# Patient Record
Sex: Male | Born: 1975
Health system: Southern US, Community
[De-identification: ages and names within clinical notes are randomized; demographics above are authoritative.]

## PROBLEM LIST (undated history)

## (undated) DIAGNOSIS — F32A Depression, unspecified: Secondary | ICD-10-CM

## (undated) DIAGNOSIS — E785 Hyperlipidemia, unspecified: Secondary | ICD-10-CM

## (undated) DIAGNOSIS — F259 Schizoaffective disorder, unspecified: Secondary | ICD-10-CM

## (undated) DIAGNOSIS — I2699 Other pulmonary embolism without acute cor pulmonale: Secondary | ICD-10-CM

## (undated) DIAGNOSIS — E119 Type 2 diabetes mellitus without complications: Secondary | ICD-10-CM

## (undated) DIAGNOSIS — F250 Schizoaffective disorder, bipolar type: Secondary | ICD-10-CM

## (undated) DIAGNOSIS — F329 Major depressive disorder, single episode, unspecified: Secondary | ICD-10-CM

## (undated) DIAGNOSIS — I10 Essential (primary) hypertension: Secondary | ICD-10-CM

## (undated) DIAGNOSIS — R791 Abnormal coagulation profile: Secondary | ICD-10-CM

## (undated) DIAGNOSIS — D6862 Lupus anticoagulant syndrome: Secondary | ICD-10-CM

## (undated) HISTORY — DX: Type 2 diabetes mellitus without complications: E11.9

---

## 2006-02-09 ENCOUNTER — Emergency Department: Payer: Self-pay

## 2006-04-24 ENCOUNTER — Emergency Department: Payer: Self-pay | Admitting: Emergency Medicine

## 2007-12-13 ENCOUNTER — Other Ambulatory Visit: Payer: Self-pay

## 2007-12-13 ENCOUNTER — Emergency Department: Payer: Self-pay | Admitting: Internal Medicine

## 2008-05-09 ENCOUNTER — Emergency Department: Payer: Self-pay | Admitting: Emergency Medicine

## 2008-05-12 ENCOUNTER — Emergency Department: Payer: Self-pay | Admitting: Emergency Medicine

## 2008-05-29 ENCOUNTER — Ambulatory Visit: Payer: Self-pay | Admitting: Internal Medicine

## 2008-06-15 ENCOUNTER — Ambulatory Visit: Payer: Self-pay | Admitting: Internal Medicine

## 2008-08-03 ENCOUNTER — Ambulatory Visit: Payer: Self-pay | Admitting: Internal Medicine

## 2008-08-13 ENCOUNTER — Ambulatory Visit: Payer: Self-pay | Admitting: Internal Medicine

## 2008-11-08 ENCOUNTER — Inpatient Hospital Stay: Payer: Self-pay | Admitting: Psychiatry

## 2008-12-05 ENCOUNTER — Inpatient Hospital Stay: Payer: Self-pay | Admitting: Specialist

## 2008-12-15 ENCOUNTER — Inpatient Hospital Stay: Payer: Self-pay | Admitting: Internal Medicine

## 2009-01-06 ENCOUNTER — Emergency Department: Payer: Self-pay | Admitting: Emergency Medicine

## 2009-01-07 ENCOUNTER — Emergency Department: Payer: Self-pay | Admitting: Emergency Medicine

## 2009-01-08 ENCOUNTER — Inpatient Hospital Stay: Payer: Self-pay | Admitting: Psychiatry

## 2009-01-29 ENCOUNTER — Inpatient Hospital Stay: Payer: Self-pay | Admitting: Psychiatry

## 2009-02-12 ENCOUNTER — Inpatient Hospital Stay: Payer: Self-pay | Admitting: Psychiatry

## 2009-02-25 ENCOUNTER — Inpatient Hospital Stay: Payer: Self-pay | Admitting: Psychiatry

## 2009-04-24 ENCOUNTER — Emergency Department: Payer: Self-pay | Admitting: Emergency Medicine

## 2009-07-01 ENCOUNTER — Inpatient Hospital Stay: Payer: Self-pay | Admitting: Psychiatry

## 2009-11-12 ENCOUNTER — Inpatient Hospital Stay: Payer: Self-pay | Admitting: Psychiatry

## 2010-01-05 ENCOUNTER — Emergency Department: Payer: Self-pay | Admitting: Emergency Medicine

## 2010-10-15 ENCOUNTER — Inpatient Hospital Stay: Payer: Self-pay | Admitting: Psychiatry

## 2010-11-07 ENCOUNTER — Inpatient Hospital Stay: Payer: Self-pay | Admitting: Psychiatry

## 2011-06-18 DIAGNOSIS — Z79899 Other long term (current) drug therapy: Secondary | ICD-10-CM | POA: Diagnosis not present

## 2011-07-17 DIAGNOSIS — Z79899 Other long term (current) drug therapy: Secondary | ICD-10-CM | POA: Diagnosis not present

## 2011-08-03 DIAGNOSIS — B351 Tinea unguium: Secondary | ICD-10-CM | POA: Diagnosis not present

## 2011-08-03 DIAGNOSIS — M79609 Pain in unspecified limb: Secondary | ICD-10-CM | POA: Diagnosis not present

## 2011-08-17 DIAGNOSIS — Z79899 Other long term (current) drug therapy: Secondary | ICD-10-CM | POA: Diagnosis not present

## 2011-09-16 DIAGNOSIS — Z79899 Other long term (current) drug therapy: Secondary | ICD-10-CM | POA: Diagnosis not present

## 2011-10-15 DIAGNOSIS — Z79899 Other long term (current) drug therapy: Secondary | ICD-10-CM | POA: Diagnosis not present

## 2011-10-20 DIAGNOSIS — F3131 Bipolar disorder, current episode depressed, mild: Secondary | ICD-10-CM | POA: Diagnosis not present

## 2011-10-20 DIAGNOSIS — I2782 Chronic pulmonary embolism: Secondary | ICD-10-CM | POA: Diagnosis not present

## 2011-10-20 DIAGNOSIS — E1149 Type 2 diabetes mellitus with other diabetic neurological complication: Secondary | ICD-10-CM | POA: Diagnosis not present

## 2011-10-20 DIAGNOSIS — I10 Essential (primary) hypertension: Secondary | ICD-10-CM | POA: Diagnosis not present

## 2011-10-20 DIAGNOSIS — E785 Hyperlipidemia, unspecified: Secondary | ICD-10-CM | POA: Diagnosis not present

## 2011-10-27 DIAGNOSIS — E785 Hyperlipidemia, unspecified: Secondary | ICD-10-CM | POA: Diagnosis not present

## 2011-10-27 DIAGNOSIS — F3131 Bipolar disorder, current episode depressed, mild: Secondary | ICD-10-CM | POA: Diagnosis not present

## 2011-10-27 DIAGNOSIS — I2782 Chronic pulmonary embolism: Secondary | ICD-10-CM | POA: Diagnosis not present

## 2011-10-27 DIAGNOSIS — I10 Essential (primary) hypertension: Secondary | ICD-10-CM | POA: Diagnosis not present

## 2011-10-27 DIAGNOSIS — E1149 Type 2 diabetes mellitus with other diabetic neurological complication: Secondary | ICD-10-CM | POA: Diagnosis not present

## 2011-11-20 DIAGNOSIS — Z79899 Other long term (current) drug therapy: Secondary | ICD-10-CM | POA: Diagnosis not present

## 2011-11-26 DIAGNOSIS — E1149 Type 2 diabetes mellitus with other diabetic neurological complication: Secondary | ICD-10-CM | POA: Diagnosis not present

## 2011-11-26 DIAGNOSIS — F3131 Bipolar disorder, current episode depressed, mild: Secondary | ICD-10-CM | POA: Diagnosis not present

## 2011-11-26 DIAGNOSIS — I2782 Chronic pulmonary embolism: Secondary | ICD-10-CM | POA: Diagnosis not present

## 2011-11-26 DIAGNOSIS — E785 Hyperlipidemia, unspecified: Secondary | ICD-10-CM | POA: Diagnosis not present

## 2011-11-26 DIAGNOSIS — I10 Essential (primary) hypertension: Secondary | ICD-10-CM | POA: Diagnosis not present

## 2011-12-07 DIAGNOSIS — M79609 Pain in unspecified limb: Secondary | ICD-10-CM | POA: Diagnosis not present

## 2011-12-07 DIAGNOSIS — B351 Tinea unguium: Secondary | ICD-10-CM | POA: Diagnosis not present

## 2011-12-16 DIAGNOSIS — Z79899 Other long term (current) drug therapy: Secondary | ICD-10-CM | POA: Diagnosis not present

## 2011-12-22 DIAGNOSIS — F3131 Bipolar disorder, current episode depressed, mild: Secondary | ICD-10-CM | POA: Diagnosis not present

## 2011-12-22 DIAGNOSIS — E1149 Type 2 diabetes mellitus with other diabetic neurological complication: Secondary | ICD-10-CM | POA: Diagnosis not present

## 2011-12-22 DIAGNOSIS — I10 Essential (primary) hypertension: Secondary | ICD-10-CM | POA: Diagnosis not present

## 2011-12-22 DIAGNOSIS — E785 Hyperlipidemia, unspecified: Secondary | ICD-10-CM | POA: Diagnosis not present

## 2011-12-22 DIAGNOSIS — I2782 Chronic pulmonary embolism: Secondary | ICD-10-CM | POA: Diagnosis not present

## 2012-01-04 DIAGNOSIS — J398 Other specified diseases of upper respiratory tract: Secondary | ICD-10-CM | POA: Diagnosis not present

## 2012-01-04 DIAGNOSIS — J00 Acute nasopharyngitis [common cold]: Secondary | ICD-10-CM | POA: Diagnosis not present

## 2012-01-22 DIAGNOSIS — Z79899 Other long term (current) drug therapy: Secondary | ICD-10-CM | POA: Diagnosis not present

## 2012-02-17 DIAGNOSIS — Z79899 Other long term (current) drug therapy: Secondary | ICD-10-CM | POA: Diagnosis not present

## 2012-03-18 DIAGNOSIS — Z79899 Other long term (current) drug therapy: Secondary | ICD-10-CM | POA: Diagnosis not present

## 2012-04-18 DIAGNOSIS — Z79899 Other long term (current) drug therapy: Secondary | ICD-10-CM | POA: Diagnosis not present

## 2012-05-15 ENCOUNTER — Ambulatory Visit: Payer: Self-pay | Admitting: Internal Medicine

## 2012-05-19 DIAGNOSIS — Z79899 Other long term (current) drug therapy: Secondary | ICD-10-CM | POA: Diagnosis not present

## 2012-05-24 DIAGNOSIS — I2782 Chronic pulmonary embolism: Secondary | ICD-10-CM | POA: Diagnosis not present

## 2012-05-24 DIAGNOSIS — Z23 Encounter for immunization: Secondary | ICD-10-CM | POA: Diagnosis not present

## 2012-05-24 DIAGNOSIS — Z8659 Personal history of other mental and behavioral disorders: Secondary | ICD-10-CM | POA: Diagnosis not present

## 2012-05-24 DIAGNOSIS — E119 Type 2 diabetes mellitus without complications: Secondary | ICD-10-CM | POA: Diagnosis not present

## 2012-06-11 LAB — PROTIME-INR: INR: 1.1

## 2012-06-11 LAB — COMPREHENSIVE METABOLIC PANEL
Albumin: 4.3 g/dL (ref 3.4–5.0)
Anion Gap: 8 (ref 7–16)
BUN: 18 mg/dL (ref 7–18)
Bilirubin,Total: 0.2 mg/dL (ref 0.2–1.0)
EGFR (African American): >60
EGFR (Non-African Amer.): >60
Glucose: 101 mg/dL — ABNORMAL HIGH (ref 65–99)
Osmolality: 278 (ref 275–301)
SGOT(AST): 22 U/L (ref 15–37)
SGPT (ALT): 24 U/L (ref 12–78)
Sodium: 138 mmol/L (ref 136–145)
Total Protein: 7.4 g/dL (ref 6.4–8.2)

## 2012-06-11 LAB — DRUG SCREEN, URINE
Barbiturates, Ur Screen: NEGATIVE (ref ?–200)
Benzodiazepine, Ur Scrn: NEGATIVE (ref ?–200)
Cannabinoid 50 Ng, Ur ~~LOC~~: NEGATIVE (ref ?–50)
Cocaine Metabolite,Ur ~~LOC~~: NEGATIVE (ref ?–300)
MDMA (Ecstasy)Ur Screen: POSITIVE (ref ?–500)
Methadone, Ur Screen: NEGATIVE (ref ?–300)
Opiate, Ur Screen: NEGATIVE (ref ?–300)
Phencyclidine (PCP) Ur S: NEGATIVE (ref ?–25)
Tricyclic, Ur Screen: NEGATIVE (ref ?–1000)

## 2012-06-11 LAB — CBC
HCT: 46.3 % (ref 40.0–52.0)
HGB: 15.9 g/dL (ref 13.0–18.0)
MCH: 30.8 pg (ref 26.0–34.0)
MCHC: 34.3 g/dL (ref 32.0–36.0)
MCV: 90 fL (ref 80–100)

## 2012-06-11 LAB — ACETAMINOPHEN LEVEL: Acetaminophen: <2 ug/mL

## 2012-06-11 LAB — ETHANOL
Ethanol %: <0.003 % (ref 0.000–0.080)
Ethanol: <3 mg/dL

## 2012-06-11 LAB — TSH: Thyroid Stimulating Horm: 1.15 u[IU]/mL

## 2012-06-11 LAB — LITHIUM LEVEL: Lithium: 0.59 mmol/L — ABNORMAL LOW

## 2012-06-11 LAB — SALICYLATE LEVEL: Salicylates, Serum: <1.7 mg/dL

## 2012-06-12 ENCOUNTER — Inpatient Hospital Stay: Payer: Self-pay | Admitting: Psychiatry

## 2012-06-12 DIAGNOSIS — F209 Schizophrenia, unspecified: Secondary | ICD-10-CM | POA: Diagnosis not present

## 2012-06-12 DIAGNOSIS — R112 Nausea with vomiting, unspecified: Secondary | ICD-10-CM | POA: Diagnosis not present

## 2012-06-12 LAB — DIFFERENTIAL
Eosinophil #: 0 10*3/uL (ref 0.0–0.7)
Eosinophil %: 0.1 %
Lymphocyte #: 1.8 10*3/uL (ref 1.0–3.6)
Monocyte #: 0.7 x10 3/mm (ref 0.2–1.0)
Monocyte %: 11 %
Neutrophil #: 4 10*3/uL (ref 1.4–6.5)

## 2012-06-13 DIAGNOSIS — F259 Schizoaffective disorder, unspecified: Secondary | ICD-10-CM | POA: Diagnosis not present

## 2012-06-13 LAB — PROTIME-INR: Prothrombin Time: 13.6 secs (ref 11.5–14.7)

## 2012-06-14 DIAGNOSIS — R894 Abnormal immunological findings in specimens from other organs, systems and tissues: Secondary | ICD-10-CM | POA: Diagnosis not present

## 2012-06-14 DIAGNOSIS — F17200 Nicotine dependence, unspecified, uncomplicated: Secondary | ICD-10-CM | POA: Diagnosis not present

## 2012-06-14 DIAGNOSIS — J449 Chronic obstructive pulmonary disease, unspecified: Secondary | ICD-10-CM | POA: Diagnosis not present

## 2012-06-14 DIAGNOSIS — D6862 Lupus anticoagulant syndrome: Secondary | ICD-10-CM

## 2012-06-14 DIAGNOSIS — Z7901 Long term (current) use of anticoagulants: Secondary | ICD-10-CM | POA: Diagnosis not present

## 2012-06-14 DIAGNOSIS — I10 Essential (primary) hypertension: Secondary | ICD-10-CM | POA: Diagnosis not present

## 2012-06-14 DIAGNOSIS — F209 Schizophrenia, unspecified: Secondary | ICD-10-CM | POA: Diagnosis not present

## 2012-06-14 DIAGNOSIS — Z86711 Personal history of pulmonary embolism: Secondary | ICD-10-CM | POA: Diagnosis not present

## 2012-06-14 DIAGNOSIS — F259 Schizoaffective disorder, unspecified: Secondary | ICD-10-CM | POA: Diagnosis not present

## 2012-06-14 DIAGNOSIS — D6859 Other primary thrombophilia: Secondary | ICD-10-CM | POA: Diagnosis not present

## 2012-06-14 HISTORY — DX: Lupus anticoagulant syndrome: D68.62

## 2012-06-15 DIAGNOSIS — I10 Essential (primary) hypertension: Secondary | ICD-10-CM | POA: Diagnosis not present

## 2012-06-15 DIAGNOSIS — F209 Schizophrenia, unspecified: Secondary | ICD-10-CM | POA: Diagnosis not present

## 2012-06-15 DIAGNOSIS — F17200 Nicotine dependence, unspecified, uncomplicated: Secondary | ICD-10-CM | POA: Diagnosis not present

## 2012-06-15 DIAGNOSIS — J449 Chronic obstructive pulmonary disease, unspecified: Secondary | ICD-10-CM | POA: Diagnosis not present

## 2012-06-15 DIAGNOSIS — D6859 Other primary thrombophilia: Secondary | ICD-10-CM | POA: Diagnosis not present

## 2012-06-15 LAB — PROTIME-INR
INR: 1.2
Prothrombin Time: 15.1 s — ABNORMAL HIGH (ref 11.5–14.7)

## 2012-06-16 DIAGNOSIS — F17200 Nicotine dependence, unspecified, uncomplicated: Secondary | ICD-10-CM | POA: Diagnosis not present

## 2012-06-16 DIAGNOSIS — D6859 Other primary thrombophilia: Secondary | ICD-10-CM | POA: Diagnosis not present

## 2012-06-16 DIAGNOSIS — F259 Schizoaffective disorder, unspecified: Secondary | ICD-10-CM | POA: Diagnosis not present

## 2012-06-16 DIAGNOSIS — J449 Chronic obstructive pulmonary disease, unspecified: Secondary | ICD-10-CM | POA: Diagnosis not present

## 2012-06-16 DIAGNOSIS — I10 Essential (primary) hypertension: Secondary | ICD-10-CM | POA: Diagnosis not present

## 2012-06-16 DIAGNOSIS — F209 Schizophrenia, unspecified: Secondary | ICD-10-CM | POA: Diagnosis not present

## 2012-06-16 LAB — PLATELET COUNT: Platelet: 205 10*3/uL (ref 150–440)

## 2012-06-16 LAB — PROTIME-INR: Prothrombin Time: 14.5 secs (ref 11.5–14.7)

## 2012-06-16 LAB — CREATININE, SERUM: Creatinine: 0.72 mg/dL (ref 0.60–1.30)

## 2012-06-17 DIAGNOSIS — F259 Schizoaffective disorder, unspecified: Secondary | ICD-10-CM | POA: Diagnosis not present

## 2012-06-17 DIAGNOSIS — F209 Schizophrenia, unspecified: Secondary | ICD-10-CM | POA: Diagnosis not present

## 2012-06-17 DIAGNOSIS — I10 Essential (primary) hypertension: Secondary | ICD-10-CM | POA: Diagnosis not present

## 2012-06-17 DIAGNOSIS — J449 Chronic obstructive pulmonary disease, unspecified: Secondary | ICD-10-CM | POA: Diagnosis not present

## 2012-06-17 DIAGNOSIS — F17200 Nicotine dependence, unspecified, uncomplicated: Secondary | ICD-10-CM | POA: Diagnosis not present

## 2012-06-17 DIAGNOSIS — D6859 Other primary thrombophilia: Secondary | ICD-10-CM | POA: Diagnosis not present

## 2012-06-17 LAB — PROTIME-INR: INR: 1.2

## 2012-06-18 DIAGNOSIS — D6859 Other primary thrombophilia: Secondary | ICD-10-CM | POA: Diagnosis not present

## 2012-06-18 DIAGNOSIS — F209 Schizophrenia, unspecified: Secondary | ICD-10-CM | POA: Diagnosis not present

## 2012-06-18 DIAGNOSIS — F17200 Nicotine dependence, unspecified, uncomplicated: Secondary | ICD-10-CM | POA: Diagnosis not present

## 2012-06-18 DIAGNOSIS — J449 Chronic obstructive pulmonary disease, unspecified: Secondary | ICD-10-CM | POA: Diagnosis not present

## 2012-06-18 DIAGNOSIS — F259 Schizoaffective disorder, unspecified: Secondary | ICD-10-CM | POA: Diagnosis not present

## 2012-06-18 DIAGNOSIS — I10 Essential (primary) hypertension: Secondary | ICD-10-CM | POA: Diagnosis not present

## 2012-06-19 DIAGNOSIS — D6859 Other primary thrombophilia: Secondary | ICD-10-CM | POA: Diagnosis not present

## 2012-06-19 DIAGNOSIS — F209 Schizophrenia, unspecified: Secondary | ICD-10-CM | POA: Diagnosis not present

## 2012-06-19 DIAGNOSIS — I10 Essential (primary) hypertension: Secondary | ICD-10-CM | POA: Diagnosis not present

## 2012-06-19 DIAGNOSIS — F259 Schizoaffective disorder, unspecified: Secondary | ICD-10-CM | POA: Diagnosis not present

## 2012-06-19 DIAGNOSIS — J449 Chronic obstructive pulmonary disease, unspecified: Secondary | ICD-10-CM | POA: Diagnosis not present

## 2012-06-19 DIAGNOSIS — F17200 Nicotine dependence, unspecified, uncomplicated: Secondary | ICD-10-CM | POA: Diagnosis not present

## 2012-06-19 LAB — DIFFERENTIAL
Eosinophil %: 0.1 %
Lymphocyte %: 28 %
Monocyte #: 0.6 x10 3/mm (ref 0.2–1.0)
Monocyte %: 12.3 %
Neutrophil #: 3.1 10*3/uL (ref 1.4–6.5)
Neutrophil %: 59.3 %

## 2012-06-19 LAB — WBC: WBC: 5.1 10*3/uL (ref 3.8–10.6)

## 2012-06-19 LAB — PROTIME-INR: Prothrombin Time: 26.9 secs — ABNORMAL HIGH (ref 11.5–14.7)

## 2012-06-20 DIAGNOSIS — F17200 Nicotine dependence, unspecified, uncomplicated: Secondary | ICD-10-CM | POA: Diagnosis not present

## 2012-06-20 DIAGNOSIS — F209 Schizophrenia, unspecified: Secondary | ICD-10-CM | POA: Diagnosis not present

## 2012-06-20 DIAGNOSIS — F259 Schizoaffective disorder, unspecified: Secondary | ICD-10-CM | POA: Diagnosis not present

## 2012-06-20 DIAGNOSIS — D6859 Other primary thrombophilia: Secondary | ICD-10-CM | POA: Diagnosis not present

## 2012-06-20 DIAGNOSIS — J449 Chronic obstructive pulmonary disease, unspecified: Secondary | ICD-10-CM | POA: Diagnosis not present

## 2012-06-20 DIAGNOSIS — I10 Essential (primary) hypertension: Secondary | ICD-10-CM | POA: Diagnosis not present

## 2012-06-29 DIAGNOSIS — Z79899 Other long term (current) drug therapy: Secondary | ICD-10-CM | POA: Diagnosis not present

## 2012-07-12 DIAGNOSIS — M79609 Pain in unspecified limb: Secondary | ICD-10-CM | POA: Diagnosis not present

## 2012-07-12 DIAGNOSIS — B351 Tinea unguium: Secondary | ICD-10-CM | POA: Diagnosis not present

## 2012-07-20 DIAGNOSIS — H31009 Unspecified chorioretinal scars, unspecified eye: Secondary | ICD-10-CM | POA: Diagnosis not present

## 2012-08-02 DIAGNOSIS — E1149 Type 2 diabetes mellitus with other diabetic neurological complication: Secondary | ICD-10-CM | POA: Diagnosis not present

## 2012-08-02 DIAGNOSIS — I2782 Chronic pulmonary embolism: Secondary | ICD-10-CM | POA: Diagnosis not present

## 2012-08-02 DIAGNOSIS — F3131 Bipolar disorder, current episode depressed, mild: Secondary | ICD-10-CM | POA: Diagnosis not present

## 2012-09-11 LAB — URINALYSIS, COMPLETE
Bilirubin,UR: NEGATIVE
Glucose,UR: NEGATIVE mg/dL (ref 0–75)
Hyaline Cast: 3
Nitrite: NEGATIVE
Protein: 100
RBC,UR: 1 /HPF (ref 0–5)
Specific Gravity: 1.025 (ref 1.003–1.030)
Squamous Epithelial: 4
WBC UR: 8 /HPF (ref 0–5)

## 2012-09-11 LAB — COMPREHENSIVE METABOLIC PANEL
Albumin: 4.2 g/dL (ref 3.4–5.0)
Alkaline Phosphatase: 76 U/L (ref 50–136)
BUN: 18 mg/dL (ref 7–18)
Calcium, Total: 9 mg/dL (ref 8.5–10.1)
Chloride: 105 mmol/L (ref 98–107)
Co2: 21 mmol/L (ref 21–32)
Creatinine: 1.14 mg/dL (ref 0.60–1.30)
EGFR (African American): >60
EGFR (Non-African Amer.): >60
Glucose: 142 mg/dL — ABNORMAL HIGH (ref 65–99)
Osmolality: 280 (ref 275–301)
Potassium: 3.8 mmol/L (ref 3.5–5.1)
SGOT(AST): 15 U/L (ref 15–37)

## 2012-09-11 LAB — CBC
HCT: 44.1 % (ref 40.0–52.0)
MCH: 31.4 pg (ref 26.0–34.0)
MCHC: 35.1 g/dL (ref 32.0–36.0)
MCV: 90 fL (ref 80–100)
Platelet: 248 10*3/uL (ref 150–440)
RBC: 4.92 10*6/uL (ref 4.40–5.90)
RDW: 12.9 % (ref 11.5–14.5)
WBC: 9.4 10*3/uL (ref 3.8–10.6)

## 2012-09-11 LAB — DIFFERENTIAL
Basophil #: 0 10*3/uL (ref 0.0–0.1)
Basophil %: 0.4 %
Eosinophil %: 0.1 %
Lymphocyte #: 1.2 10*3/uL (ref 1.0–3.6)
Lymphocyte %: 12.7 %
Monocyte #: 0.8 x10 3/mm (ref 0.2–1.0)
Monocyte %: 8.5 %
Neutrophil #: 7.4 10*3/uL — ABNORMAL HIGH (ref 1.4–6.5)
Neutrophil %: 78.3 %

## 2012-09-11 LAB — DRUG SCREEN, URINE
Barbiturates, Ur Screen: NEGATIVE (ref ?–200)
Benzodiazepine, Ur Scrn: NEGATIVE (ref ?–200)
Cocaine Metabolite,Ur ~~LOC~~: NEGATIVE (ref ?–300)
Methadone, Ur Screen: NEGATIVE (ref ?–300)
Opiate, Ur Screen: NEGATIVE (ref ?–300)
Tricyclic, Ur Screen: NEGATIVE (ref ?–1000)

## 2012-09-11 LAB — ETHANOL: Ethanol %: <0.003 % (ref 0.000–0.080)

## 2012-09-11 LAB — LITHIUM LEVEL: Lithium: 1.12 mmol/L

## 2012-09-12 ENCOUNTER — Inpatient Hospital Stay: Payer: Self-pay | Admitting: Psychiatry

## 2012-09-12 DIAGNOSIS — F200 Paranoid schizophrenia: Secondary | ICD-10-CM | POA: Diagnosis not present

## 2012-09-12 DIAGNOSIS — I10 Essential (primary) hypertension: Secondary | ICD-10-CM | POA: Diagnosis present

## 2012-09-12 DIAGNOSIS — E119 Type 2 diabetes mellitus without complications: Secondary | ICD-10-CM | POA: Diagnosis not present

## 2012-09-12 DIAGNOSIS — Z86711 Personal history of pulmonary embolism: Secondary | ICD-10-CM | POA: Diagnosis not present

## 2012-09-12 DIAGNOSIS — R791 Abnormal coagulation profile: Secondary | ICD-10-CM | POA: Diagnosis present

## 2012-09-12 DIAGNOSIS — E785 Hyperlipidemia, unspecified: Secondary | ICD-10-CM | POA: Diagnosis not present

## 2012-09-12 DIAGNOSIS — Z818 Family history of other mental and behavioral disorders: Secondary | ICD-10-CM | POA: Diagnosis not present

## 2012-09-12 DIAGNOSIS — F259 Schizoaffective disorder, unspecified: Secondary | ICD-10-CM | POA: Diagnosis not present

## 2012-09-12 DIAGNOSIS — F411 Generalized anxiety disorder: Secondary | ICD-10-CM | POA: Diagnosis not present

## 2012-09-12 DIAGNOSIS — Z8659 Personal history of other mental and behavioral disorders: Secondary | ICD-10-CM | POA: Diagnosis not present

## 2012-09-12 DIAGNOSIS — Z880 Allergy status to penicillin: Secondary | ICD-10-CM | POA: Diagnosis not present

## 2012-09-12 DIAGNOSIS — Z79899 Other long term (current) drug therapy: Secondary | ICD-10-CM | POA: Diagnosis not present

## 2012-09-12 DIAGNOSIS — J449 Chronic obstructive pulmonary disease, unspecified: Secondary | ICD-10-CM | POA: Diagnosis not present

## 2012-09-12 DIAGNOSIS — F2089 Other schizophrenia: Secondary | ICD-10-CM | POA: Diagnosis not present

## 2012-09-12 DIAGNOSIS — F29 Unspecified psychosis not due to a substance or known physiological condition: Secondary | ICD-10-CM | POA: Diagnosis not present

## 2012-09-12 DIAGNOSIS — IMO0002 Reserved for concepts with insufficient information to code with codable children: Secondary | ICD-10-CM | POA: Diagnosis not present

## 2012-09-12 DIAGNOSIS — R45851 Suicidal ideations: Secondary | ICD-10-CM | POA: Diagnosis not present

## 2012-09-12 DIAGNOSIS — Z0389 Encounter for observation for other suspected diseases and conditions ruled out: Secondary | ICD-10-CM | POA: Diagnosis not present

## 2012-09-12 LAB — PROTIME-INR
INR: 4.1
Prothrombin Time: 37.8 secs — ABNORMAL HIGH (ref 11.5–14.7)

## 2012-09-14 LAB — PROTIME-INR
INR: 1.4
Prothrombin Time: 17.5 secs — ABNORMAL HIGH (ref 11.5–14.7)

## 2012-09-15 LAB — PROTIME-INR
INR: 1.2
Prothrombin Time: 15.1 secs — ABNORMAL HIGH (ref 11.5–14.7)

## 2012-09-15 LAB — HEMOGLOBIN: HGB: 13.3 g/dL (ref 13.0–18.0)

## 2012-09-17 ENCOUNTER — Inpatient Hospital Stay: Payer: Self-pay | Admitting: Psychiatry

## 2012-09-17 DIAGNOSIS — F2089 Other schizophrenia: Secondary | ICD-10-CM | POA: Diagnosis not present

## 2012-09-17 LAB — COMPREHENSIVE METABOLIC PANEL
Albumin: 3.9 g/dL (ref 3.4–5.0)
Alkaline Phosphatase: 75 U/L (ref 50–136)
BUN: 16 mg/dL (ref 7–18)
Bilirubin,Total: 0.2 mg/dL (ref 0.2–1.0)
Calcium, Total: 9 mg/dL (ref 8.5–10.1)
Creatinine: 0.81 mg/dL (ref 0.60–1.30)
Glucose: 114 mg/dL — ABNORMAL HIGH (ref 65–99)
Osmolality: 281 (ref 275–301)
Potassium: 4.5 mmol/L (ref 3.5–5.1)
SGOT(AST): 30 U/L (ref 15–37)
SGPT (ALT): 39 U/L (ref 12–78)
Total Protein: 6.8 g/dL (ref 6.4–8.2)

## 2012-09-17 LAB — ACETAMINOPHEN LEVEL: Acetaminophen: <2 ug/mL

## 2012-09-17 LAB — DRUG SCREEN, URINE
Barbiturates, Ur Screen: NEGATIVE (ref ?–200)
Benzodiazepine, Ur Scrn: POSITIVE (ref ?–200)
Cannabinoid 50 Ng, Ur ~~LOC~~: NEGATIVE (ref ?–50)
Cocaine Metabolite,Ur ~~LOC~~: NEGATIVE (ref ?–300)
MDMA (Ecstasy)Ur Screen: NEGATIVE (ref ?–500)
Opiate, Ur Screen: NEGATIVE (ref ?–300)
Tricyclic, Ur Screen: NEGATIVE (ref ?–1000)

## 2012-09-17 LAB — CBC
HCT: 42.5 % (ref 40.0–52.0)
HGB: 14.6 g/dL (ref 13.0–18.0)
MCHC: 34.3 g/dL (ref 32.0–36.0)

## 2012-09-17 LAB — SALICYLATE LEVEL: Salicylates, Serum: <1.7 mg/dL

## 2012-09-17 LAB — ETHANOL
Ethanol %: <0.003 % (ref 0.000–0.080)
Ethanol: <3 mg/dL

## 2012-09-17 LAB — TSH: Thyroid Stimulating Horm: 1.57 u[IU]/mL

## 2012-09-18 LAB — DIFFERENTIAL
Basophil #: 0 10*3/uL (ref 0.0–0.1)
Basophil %: 0.3 %
Lymphocyte #: 1.2 10*3/uL (ref 1.0–3.6)
Lymphocyte %: 22.1 %
Monocyte #: 0.7 x10 3/mm (ref 0.2–1.0)
Monocyte %: 12.2 %
Neutrophil #: 3.6 10*3/uL (ref 1.4–6.5)
Neutrophil %: 65.4 %

## 2012-09-18 LAB — WBC: WBC: 5.4 10*3/uL (ref 3.8–10.6)

## 2012-09-20 LAB — PROTIME-INR: Prothrombin Time: 20.9 secs — ABNORMAL HIGH (ref 11.5–14.7)

## 2012-09-24 LAB — PROTIME-INR: Prothrombin Time: 31 secs — ABNORMAL HIGH (ref 11.5–14.7)

## 2012-09-25 LAB — DIFFERENTIAL
Basophil #: 0 10*3/uL (ref 0.0–0.1)
Basophil %: 0.2 %
Eosinophil %: 0 %
Lymphocyte #: 1.5 10*3/uL (ref 1.0–3.6)
Lymphocyte %: 25.8 %
Neutrophil #: 3.6 10*3/uL (ref 1.4–6.5)
Neutrophil %: 62.1 %

## 2012-09-25 LAB — PROTIME-INR
INR: 2.5
Prothrombin Time: 26.1 secs — ABNORMAL HIGH (ref 11.5–14.7)

## 2012-09-27 LAB — PROTIME-INR: INR: 1.2

## 2012-11-17 DIAGNOSIS — B351 Tinea unguium: Secondary | ICD-10-CM | POA: Diagnosis not present

## 2012-11-17 DIAGNOSIS — E119 Type 2 diabetes mellitus without complications: Secondary | ICD-10-CM | POA: Diagnosis not present

## 2012-11-29 DIAGNOSIS — Z79899 Other long term (current) drug therapy: Secondary | ICD-10-CM | POA: Diagnosis not present

## 2012-12-07 DIAGNOSIS — E119 Type 2 diabetes mellitus without complications: Secondary | ICD-10-CM | POA: Diagnosis not present

## 2012-12-07 DIAGNOSIS — I2782 Chronic pulmonary embolism: Secondary | ICD-10-CM | POA: Diagnosis not present

## 2012-12-07 DIAGNOSIS — I10 Essential (primary) hypertension: Secondary | ICD-10-CM | POA: Diagnosis not present

## 2012-12-07 DIAGNOSIS — E785 Hyperlipidemia, unspecified: Secondary | ICD-10-CM | POA: Diagnosis not present

## 2012-12-07 DIAGNOSIS — E669 Obesity, unspecified: Secondary | ICD-10-CM | POA: Diagnosis not present

## 2012-12-07 DIAGNOSIS — E1142 Type 2 diabetes mellitus with diabetic polyneuropathy: Secondary | ICD-10-CM | POA: Diagnosis not present

## 2012-12-07 DIAGNOSIS — I209 Angina pectoris, unspecified: Secondary | ICD-10-CM | POA: Diagnosis not present

## 2012-12-07 DIAGNOSIS — E1149 Type 2 diabetes mellitus with other diabetic neurological complication: Secondary | ICD-10-CM | POA: Diagnosis not present

## 2012-12-09 DIAGNOSIS — E785 Hyperlipidemia, unspecified: Secondary | ICD-10-CM | POA: Diagnosis not present

## 2012-12-09 DIAGNOSIS — I2782 Chronic pulmonary embolism: Secondary | ICD-10-CM | POA: Diagnosis not present

## 2012-12-09 DIAGNOSIS — E119 Type 2 diabetes mellitus without complications: Secondary | ICD-10-CM | POA: Diagnosis not present

## 2012-12-09 DIAGNOSIS — Z8659 Personal history of other mental and behavioral disorders: Secondary | ICD-10-CM | POA: Diagnosis not present

## 2012-12-09 DIAGNOSIS — F3131 Bipolar disorder, current episode depressed, mild: Secondary | ICD-10-CM | POA: Diagnosis not present

## 2012-12-27 DIAGNOSIS — I2782 Chronic pulmonary embolism: Secondary | ICD-10-CM | POA: Diagnosis not present

## 2012-12-27 DIAGNOSIS — F3131 Bipolar disorder, current episode depressed, mild: Secondary | ICD-10-CM | POA: Diagnosis not present

## 2012-12-27 DIAGNOSIS — E119 Type 2 diabetes mellitus without complications: Secondary | ICD-10-CM | POA: Diagnosis not present

## 2013-02-01 DIAGNOSIS — E669 Obesity, unspecified: Secondary | ICD-10-CM | POA: Diagnosis not present

## 2013-02-01 DIAGNOSIS — E119 Type 2 diabetes mellitus without complications: Secondary | ICD-10-CM | POA: Diagnosis not present

## 2013-02-01 DIAGNOSIS — I209 Angina pectoris, unspecified: Secondary | ICD-10-CM | POA: Diagnosis not present

## 2013-02-01 DIAGNOSIS — E785 Hyperlipidemia, unspecified: Secondary | ICD-10-CM | POA: Diagnosis not present

## 2013-02-01 DIAGNOSIS — I2782 Chronic pulmonary embolism: Secondary | ICD-10-CM | POA: Diagnosis not present

## 2013-02-01 DIAGNOSIS — T887XXA Unspecified adverse effect of drug or medicament, initial encounter: Secondary | ICD-10-CM | POA: Diagnosis not present

## 2013-02-01 DIAGNOSIS — F3131 Bipolar disorder, current episode depressed, mild: Secondary | ICD-10-CM | POA: Diagnosis not present

## 2013-02-10 DIAGNOSIS — E1169 Type 2 diabetes mellitus with other specified complication: Secondary | ICD-10-CM | POA: Diagnosis not present

## 2013-02-10 DIAGNOSIS — F3131 Bipolar disorder, current episode depressed, mild: Secondary | ICD-10-CM | POA: Diagnosis not present

## 2013-02-10 DIAGNOSIS — E785 Hyperlipidemia, unspecified: Secondary | ICD-10-CM | POA: Diagnosis not present

## 2013-02-10 DIAGNOSIS — I209 Angina pectoris, unspecified: Secondary | ICD-10-CM | POA: Diagnosis not present

## 2013-02-27 LAB — CBC
HCT: 52.4 % — ABNORMAL HIGH (ref 40.0–52.0)
HGB: 18.3 g/dL — ABNORMAL HIGH (ref 13.0–18.0)
Platelet: 363 10*3/uL (ref 150–440)
RBC: 5.95 10*6/uL — ABNORMAL HIGH (ref 4.40–5.90)
WBC: 11.3 10*3/uL — ABNORMAL HIGH (ref 3.8–10.6)

## 2013-02-27 LAB — ETHANOL
Ethanol %: <0.003 % (ref 0.000–0.080)
Ethanol: <3 mg/dL

## 2013-02-27 LAB — URINALYSIS, COMPLETE
Bilirubin,UR: NEGATIVE
Glucose,UR: NEGATIVE mg/dL (ref 0–75)
Leukocyte Esterase: NEGATIVE
Nitrite: NEGATIVE
Ph: 6 (ref 4.5–8.0)
Protein: 25
Specific Gravity: 1.025 (ref 1.003–1.030)
WBC UR: 6 /HPF (ref 0–5)

## 2013-02-27 LAB — LITHIUM LEVEL: Lithium: 0.69 mmol/L

## 2013-02-27 LAB — DRUG SCREEN, URINE
Barbiturates, Ur Screen: NEGATIVE (ref ?–200)
Benzodiazepine, Ur Scrn: NEGATIVE (ref ?–200)
Cocaine Metabolite,Ur ~~LOC~~: NEGATIVE (ref ?–300)
MDMA (Ecstasy)Ur Screen: NEGATIVE (ref ?–500)
Opiate, Ur Screen: NEGATIVE (ref ?–300)
Phencyclidine (PCP) Ur S: NEGATIVE (ref ?–25)

## 2013-02-27 LAB — COMPREHENSIVE METABOLIC PANEL
Albumin: 4.6 g/dL (ref 3.4–5.0)
Alkaline Phosphatase: 79 U/L (ref 50–136)
Anion Gap: 9 (ref 7–16)
Chloride: 98 mmol/L (ref 98–107)
EGFR (African American): >60
EGFR (Non-African Amer.): >60
Osmolality: 276 (ref 275–301)
SGOT(AST): 23 U/L (ref 15–37)
SGPT (ALT): 23 U/L (ref 12–78)
Sodium: 135 mmol/L — ABNORMAL LOW (ref 136–145)

## 2013-02-27 LAB — DIFFERENTIAL
Comment - H1-Com1: NORMAL
Monocytes: 9 %

## 2013-02-27 LAB — PROTIME-INR
INR: 1.9
Prothrombin Time: 21.7 secs — ABNORMAL HIGH (ref 11.5–14.7)

## 2013-02-28 ENCOUNTER — Inpatient Hospital Stay: Payer: Self-pay | Admitting: Psychiatry

## 2013-02-28 DIAGNOSIS — Z6221 Child in welfare custody: Secondary | ICD-10-CM | POA: Diagnosis not present

## 2013-02-28 DIAGNOSIS — I10 Essential (primary) hypertension: Secondary | ICD-10-CM | POA: Diagnosis not present

## 2013-02-28 DIAGNOSIS — IMO0002 Reserved for concepts with insufficient information to code with codable children: Secondary | ICD-10-CM | POA: Diagnosis not present

## 2013-02-28 DIAGNOSIS — Z8659 Personal history of other mental and behavioral disorders: Secondary | ICD-10-CM | POA: Diagnosis not present

## 2013-02-28 DIAGNOSIS — F259 Schizoaffective disorder, unspecified: Secondary | ICD-10-CM | POA: Diagnosis not present

## 2013-02-28 DIAGNOSIS — F429 Obsessive-compulsive disorder, unspecified: Secondary | ICD-10-CM | POA: Diagnosis present

## 2013-02-28 DIAGNOSIS — E119 Type 2 diabetes mellitus without complications: Secondary | ICD-10-CM | POA: Diagnosis not present

## 2013-02-28 DIAGNOSIS — Z7901 Long term (current) use of anticoagulants: Secondary | ICD-10-CM | POA: Diagnosis not present

## 2013-02-28 DIAGNOSIS — Z79899 Other long term (current) drug therapy: Secondary | ICD-10-CM | POA: Diagnosis not present

## 2013-02-28 DIAGNOSIS — Z880 Allergy status to penicillin: Secondary | ICD-10-CM | POA: Diagnosis not present

## 2013-02-28 DIAGNOSIS — Z818 Family history of other mental and behavioral disorders: Secondary | ICD-10-CM | POA: Diagnosis not present

## 2013-02-28 DIAGNOSIS — F200 Paranoid schizophrenia: Secondary | ICD-10-CM | POA: Diagnosis not present

## 2013-02-28 DIAGNOSIS — Z91199 Patient's noncompliance with other medical treatment and regimen due to unspecified reason: Secondary | ICD-10-CM | POA: Diagnosis not present

## 2013-02-28 DIAGNOSIS — J449 Chronic obstructive pulmonary disease, unspecified: Secondary | ICD-10-CM | POA: Diagnosis not present

## 2013-02-28 DIAGNOSIS — F209 Schizophrenia, unspecified: Secondary | ICD-10-CM | POA: Diagnosis not present

## 2013-02-28 DIAGNOSIS — F411 Generalized anxiety disorder: Secondary | ICD-10-CM | POA: Diagnosis present

## 2013-02-28 DIAGNOSIS — Z9119 Patient's noncompliance with other medical treatment and regimen: Secondary | ICD-10-CM | POA: Diagnosis not present

## 2013-02-28 DIAGNOSIS — IMO0001 Reserved for inherently not codable concepts without codable children: Secondary | ICD-10-CM | POA: Diagnosis not present

## 2013-02-28 DIAGNOSIS — Z7982 Long term (current) use of aspirin: Secondary | ICD-10-CM | POA: Diagnosis not present

## 2013-02-28 DIAGNOSIS — Z86711 Personal history of pulmonary embolism: Secondary | ICD-10-CM | POA: Diagnosis not present

## 2013-02-28 DIAGNOSIS — E785 Hyperlipidemia, unspecified: Secondary | ICD-10-CM | POA: Diagnosis not present

## 2013-02-28 LAB — PROTIME-INR: INR: 2.7

## 2013-03-02 LAB — PROTIME-INR
INR: 1.6
Prothrombin Time: 18.6 secs — ABNORMAL HIGH (ref 11.5–14.7)

## 2013-03-03 LAB — HEMOGLOBIN: HGB: 15.7 g/dL (ref 13.0–18.0)

## 2013-03-06 LAB — DIFFERENTIAL
Basophil #: 0 10*3/uL (ref 0.0–0.1)
Basophil %: 0.4 %
Eosinophil #: 0 10*3/uL (ref 0.0–0.7)
Eosinophil %: 0 %
Lymphocyte #: 1.4 10*3/uL (ref 1.0–3.6)
Lymphocyte %: 22.6 %
Monocyte %: 14.3 %
Neutrophil %: 62.7 %

## 2013-03-06 LAB — PROTIME-INR: INR: 1.7

## 2013-03-07 LAB — PROTIME-INR
INR: 2.1
Prothrombin Time: 22.9 secs — ABNORMAL HIGH (ref 11.5–14.7)

## 2013-03-07 LAB — HEMOGLOBIN: HGB: 13.9 g/dL (ref 13.0–18.0)

## 2013-03-09 LAB — PROTIME-INR: INR: 2.9

## 2013-03-11 LAB — HEMOGLOBIN: HGB: 14 g/dL (ref 13.0–18.0)

## 2013-03-11 LAB — PROTIME-INR: Prothrombin Time: 38.5 secs — ABNORMAL HIGH (ref 11.5–14.7)

## 2013-03-12 LAB — PROTIME-INR
INR: 2.7
Prothrombin Time: 28.2 secs — ABNORMAL HIGH (ref 11.5–14.7)

## 2013-03-13 LAB — DIFFERENTIAL
Basophil #: 0 10*3/uL (ref 0.0–0.1)
Eosinophil #: 0 10*3/uL (ref 0.0–0.7)
Lymphocyte #: 1.6 10*3/uL (ref 1.0–3.6)
Lymphocyte %: 25.4 %
Neutrophil #: 3.8 10*3/uL (ref 1.4–6.5)

## 2013-03-13 LAB — PROTIME-INR
INR: 2.5
Prothrombin Time: 26.6 secs — ABNORMAL HIGH (ref 11.5–14.7)

## 2013-03-21 DIAGNOSIS — F259 Schizoaffective disorder, unspecified: Secondary | ICD-10-CM | POA: Diagnosis not present

## 2013-03-26 LAB — URINALYSIS, COMPLETE
Bilirubin,UR: NEGATIVE
Glucose,UR: NEGATIVE mg/dL (ref 0–75)
Ketone: NEGATIVE
Leukocyte Esterase: NEGATIVE
Protein: NEGATIVE
Squamous Epithelial: 2

## 2013-03-26 LAB — COMPREHENSIVE METABOLIC PANEL
Albumin: 4.5 g/dL (ref 3.4–5.0)
Alkaline Phosphatase: 76 U/L (ref 50–136)
Anion Gap: 5 — ABNORMAL LOW (ref 7–16)
BUN: 18 mg/dL (ref 7–18)
Bilirubin,Total: 0.4 mg/dL (ref 0.2–1.0)
Calcium, Total: 9.6 mg/dL (ref 8.5–10.1)
Chloride: 109 mmol/L — ABNORMAL HIGH (ref 98–107)
Co2: 27 mmol/L (ref 21–32)
Creatinine: 0.79 mg/dL (ref 0.60–1.30)
EGFR (African American): >60
EGFR (Non-African Amer.): >60
Osmolality: 283 (ref 275–301)
Potassium: 4.1 mmol/L (ref 3.5–5.1)
SGPT (ALT): 22 U/L (ref 12–78)
Sodium: 141 mmol/L (ref 136–145)
Total Protein: 7.3 g/dL (ref 6.4–8.2)

## 2013-03-26 LAB — DIFFERENTIAL
Basophil #: 0 10*3/uL (ref 0.0–0.1)
Eosinophil #: 0 10*3/uL (ref 0.0–0.7)
Eosinophil %: 0 %
Lymphocyte #: 2.3 10*3/uL (ref 1.0–3.6)
Monocyte %: 11 %
Neutrophil %: 65.2 %

## 2013-03-26 LAB — DRUG SCREEN, URINE
Amphetamines, Ur Screen: NEGATIVE (ref ?–1000)
Barbiturates, Ur Screen: NEGATIVE (ref ?–200)
Benzodiazepine, Ur Scrn: NEGATIVE (ref ?–200)
Cannabinoid 50 Ng, Ur ~~LOC~~: NEGATIVE (ref ?–50)
Cocaine Metabolite,Ur ~~LOC~~: NEGATIVE (ref ?–300)
MDMA (Ecstasy)Ur Screen: NEGATIVE (ref ?–500)
Methadone, Ur Screen: NEGATIVE (ref ?–300)
Tricyclic, Ur Screen: NEGATIVE (ref ?–1000)

## 2013-03-26 LAB — CBC
HGB: 16.4 g/dL (ref 13.0–18.0)
MCH: 31.5 pg (ref 26.0–34.0)
MCV: 91 fL (ref 80–100)
Platelet: 340 10*3/uL (ref 150–440)
RBC: 5.21 10*6/uL (ref 4.40–5.90)
RDW: 14.1 % (ref 11.5–14.5)

## 2013-03-26 LAB — PROTIME-INR: Prothrombin Time: 17.1 secs — ABNORMAL HIGH (ref 11.5–14.7)

## 2013-03-26 LAB — ETHANOL: Ethanol: <3 mg/dL

## 2013-03-26 LAB — TSH: Thyroid Stimulating Horm: 2.07 u[IU]/mL

## 2013-03-26 LAB — LITHIUM LEVEL: Lithium: 0.79 mmol/L

## 2013-03-28 ENCOUNTER — Inpatient Hospital Stay: Payer: Self-pay | Admitting: Psychiatry

## 2013-03-28 DIAGNOSIS — F429 Obsessive-compulsive disorder, unspecified: Secondary | ICD-10-CM | POA: Diagnosis present

## 2013-03-28 DIAGNOSIS — Z7901 Long term (current) use of anticoagulants: Secondary | ICD-10-CM | POA: Diagnosis not present

## 2013-03-28 DIAGNOSIS — E785 Hyperlipidemia, unspecified: Secondary | ICD-10-CM | POA: Diagnosis present

## 2013-03-28 DIAGNOSIS — I10 Essential (primary) hypertension: Secondary | ICD-10-CM | POA: Diagnosis present

## 2013-03-28 DIAGNOSIS — J449 Chronic obstructive pulmonary disease, unspecified: Secondary | ICD-10-CM | POA: Diagnosis not present

## 2013-03-28 DIAGNOSIS — Z79899 Other long term (current) drug therapy: Secondary | ICD-10-CM | POA: Diagnosis not present

## 2013-03-28 DIAGNOSIS — E119 Type 2 diabetes mellitus without complications: Secondary | ICD-10-CM | POA: Diagnosis present

## 2013-03-28 DIAGNOSIS — IMO0002 Reserved for concepts with insufficient information to code with codable children: Secondary | ICD-10-CM | POA: Diagnosis not present

## 2013-03-28 DIAGNOSIS — Z86711 Personal history of pulmonary embolism: Secondary | ICD-10-CM | POA: Diagnosis not present

## 2013-03-28 DIAGNOSIS — Z818 Family history of other mental and behavioral disorders: Secondary | ICD-10-CM | POA: Diagnosis not present

## 2013-03-28 DIAGNOSIS — Z880 Allergy status to penicillin: Secondary | ICD-10-CM | POA: Diagnosis not present

## 2013-03-28 DIAGNOSIS — Z794 Long term (current) use of insulin: Secondary | ICD-10-CM | POA: Diagnosis not present

## 2013-03-28 DIAGNOSIS — F209 Schizophrenia, unspecified: Secondary | ICD-10-CM | POA: Diagnosis not present

## 2013-03-28 DIAGNOSIS — F259 Schizoaffective disorder, unspecified: Secondary | ICD-10-CM | POA: Diagnosis not present

## 2013-03-28 DIAGNOSIS — F309 Manic episode, unspecified: Secondary | ICD-10-CM | POA: Diagnosis not present

## 2013-03-28 DIAGNOSIS — Z7982 Long term (current) use of aspirin: Secondary | ICD-10-CM | POA: Diagnosis not present

## 2013-03-30 LAB — HEMOGLOBIN: HGB: 15.6 g/dL (ref 13.0–18.0)

## 2013-03-30 LAB — PROTIME-INR: Prothrombin Time: 20.2 secs — ABNORMAL HIGH (ref 11.5–14.7)

## 2013-04-01 LAB — DIFFERENTIAL
Basophil %: 0.1 %
Eosinophil #: 0 10*3/uL (ref 0.0–0.7)
Eosinophil %: 0.1 %
Lymphocyte #: 1.4 10*3/uL (ref 1.0–3.6)
Monocyte #: 0.8 x10 3/mm (ref 0.2–1.0)
Neutrophil #: 3.6 10*3/uL (ref 1.4–6.5)
Neutrophil %: 62.1 %

## 2013-04-02 LAB — DIFFERENTIAL
Basophil #: 0 10*3/uL (ref 0.0–0.1)
Basophil %: 0.1 %
Eosinophil #: 0 10*3/uL (ref 0.0–0.7)
Eosinophil %: 0 %
Lymphocyte %: 27.4 %
Monocyte #: 0.9 x10 3/mm (ref 0.2–1.0)
Monocyte %: 17.5 %
Neutrophil #: 2.8 10*3/uL (ref 1.4–6.5)

## 2013-04-02 LAB — WBC: WBC: 5.1 10*3/uL (ref 3.8–10.6)

## 2013-04-03 LAB — HEMOGLOBIN: HGB: 14.5 g/dL (ref 13.0–18.0)

## 2013-04-04 LAB — PROTIME-INR
INR: 3
Prothrombin Time: 30.5 secs — ABNORMAL HIGH (ref 11.5–14.7)

## 2013-04-13 DIAGNOSIS — I10 Essential (primary) hypertension: Secondary | ICD-10-CM | POA: Diagnosis not present

## 2013-04-13 DIAGNOSIS — I2782 Chronic pulmonary embolism: Secondary | ICD-10-CM | POA: Diagnosis not present

## 2013-04-13 DIAGNOSIS — F3131 Bipolar disorder, current episode depressed, mild: Secondary | ICD-10-CM | POA: Diagnosis not present

## 2013-04-13 DIAGNOSIS — I209 Angina pectoris, unspecified: Secondary | ICD-10-CM | POA: Diagnosis not present

## 2013-04-17 DIAGNOSIS — Z79899 Other long term (current) drug therapy: Secondary | ICD-10-CM | POA: Diagnosis not present

## 2013-05-01 DIAGNOSIS — Z79899 Other long term (current) drug therapy: Secondary | ICD-10-CM | POA: Diagnosis not present

## 2013-05-08 DIAGNOSIS — E1149 Type 2 diabetes mellitus with other diabetic neurological complication: Secondary | ICD-10-CM | POA: Diagnosis not present

## 2013-05-08 DIAGNOSIS — E1142 Type 2 diabetes mellitus with diabetic polyneuropathy: Secondary | ICD-10-CM | POA: Diagnosis not present

## 2013-05-08 DIAGNOSIS — I209 Angina pectoris, unspecified: Secondary | ICD-10-CM | POA: Diagnosis not present

## 2013-05-08 DIAGNOSIS — F3131 Bipolar disorder, current episode depressed, mild: Secondary | ICD-10-CM | POA: Diagnosis not present

## 2013-05-08 DIAGNOSIS — I10 Essential (primary) hypertension: Secondary | ICD-10-CM | POA: Diagnosis not present

## 2013-05-17 DIAGNOSIS — Z79899 Other long term (current) drug therapy: Secondary | ICD-10-CM | POA: Diagnosis not present

## 2013-05-29 DIAGNOSIS — Z79899 Other long term (current) drug therapy: Secondary | ICD-10-CM | POA: Diagnosis not present

## 2013-06-12 DIAGNOSIS — Z79899 Other long term (current) drug therapy: Secondary | ICD-10-CM | POA: Diagnosis not present

## 2013-06-26 DIAGNOSIS — Z79899 Other long term (current) drug therapy: Secondary | ICD-10-CM | POA: Diagnosis not present

## 2013-07-12 DIAGNOSIS — Z79899 Other long term (current) drug therapy: Secondary | ICD-10-CM | POA: Diagnosis not present

## 2013-07-24 DIAGNOSIS — Z79899 Other long term (current) drug therapy: Secondary | ICD-10-CM | POA: Diagnosis not present

## 2013-08-07 DIAGNOSIS — Z79899 Other long term (current) drug therapy: Secondary | ICD-10-CM | POA: Diagnosis not present

## 2013-08-24 DIAGNOSIS — Z79899 Other long term (current) drug therapy: Secondary | ICD-10-CM | POA: Diagnosis not present

## 2013-08-29 DIAGNOSIS — E119 Type 2 diabetes mellitus without complications: Secondary | ICD-10-CM | POA: Diagnosis not present

## 2013-08-29 DIAGNOSIS — E1142 Type 2 diabetes mellitus with diabetic polyneuropathy: Secondary | ICD-10-CM | POA: Diagnosis not present

## 2013-08-29 DIAGNOSIS — I209 Angina pectoris, unspecified: Secondary | ICD-10-CM | POA: Diagnosis not present

## 2013-08-29 DIAGNOSIS — E1149 Type 2 diabetes mellitus with other diabetic neurological complication: Secondary | ICD-10-CM | POA: Diagnosis not present

## 2013-09-04 DIAGNOSIS — Z79899 Other long term (current) drug therapy: Secondary | ICD-10-CM | POA: Diagnosis not present

## 2013-09-19 DIAGNOSIS — Z79899 Other long term (current) drug therapy: Secondary | ICD-10-CM | POA: Diagnosis not present

## 2013-09-20 ENCOUNTER — Ambulatory Visit (INDEPENDENT_AMBULATORY_CARE_PROVIDER_SITE_OTHER): Payer: Medicare Other | Admitting: Podiatry

## 2013-09-20 ENCOUNTER — Encounter: Payer: Self-pay | Admitting: Podiatry

## 2013-09-20 VITALS — BP 118/74 | HR 89 | Resp 16 | Ht 70.5 in | Wt 220.0 lb

## 2013-09-20 DIAGNOSIS — Q828 Other specified congenital malformations of skin: Secondary | ICD-10-CM

## 2013-09-20 DIAGNOSIS — B351 Tinea unguium: Secondary | ICD-10-CM

## 2013-09-20 DIAGNOSIS — E119 Type 2 diabetes mellitus without complications: Secondary | ICD-10-CM

## 2013-09-20 DIAGNOSIS — M79609 Pain in unspecified limb: Secondary | ICD-10-CM

## 2013-09-20 NOTE — Progress Notes (Signed)
   Subjective:    Patient ID: Lance CrutchJames [name], male    DOB: 1975/12/01, 38 y.o.   MRN: [id number]  HPI Comments: "I have this hard place on my foot"  Patient states that his PCP sent him here to get toenails cut and callus trimmed plantar right foot. He is diabetic. It does get painful at times.     Review of Systems  All other systems reviewed and are negative.      Objective:   Physical Exam: I have reviewed his past medical history medications allergies surgeries social history and review of systems. Pulses are strongly palpable bilateral neurologic sensorium is intact per since once the monofilament bilateral deep tendon reflexes are intact bilateral muscle strength is 5 over 5 dorsiflexors plantar flexors inverters everters all intrinsic musculature is intact. Orthopedic evaluation Mr. is all joints distal to the ankle a full range of motion without crepitus with mild hallux abductovalgus deformity bilateral. Cutaneous evaluation demonstrates supple well hydrated cutis nails are thick yellow dystrophic with mycotic and painful palpation. Porokeratotic lesions plantar aspect of the bilateral foot is also noted.        Assessment & Plan:  Assessment: Diabetes mellitus with pain in limb secondary to onychomycosis 1 through 5 bilateral porokeratotic lesions bilateral.  Plan: Debridement of all reactive hyperkeratotic lesions and debridement of nails 1 through 5 bilateral.

## 2013-09-20 NOTE — Patient Instructions (Signed)
Diabetes and Foot Care Diabetes may cause you to have problems because of poor blood supply (circulation) to your feet and legs. This may cause the skin on your feet to become thinner, break easier, and heal more slowly. Your skin may become dry, and the skin may peel and crack. You may also have nerve damage in your legs and feet causing decreased feeling in them. You may not notice minor injuries to your feet that could lead to infections or more serious problems. Taking care of your feet is one of the most important things you can do for yourself.  HOME CARE INSTRUCTIONS  Wear shoes at all times, even in the house. Do not go barefoot. Bare feet are easily injured.  Check your feet daily for blisters, cuts, and redness. If you cannot see the bottom of your feet, use a mirror or ask someone for help.  Wash your feet with warm water (do not use hot water) and mild soap. Then pat your feet and the areas between your toes until they are completely dry. Do not soak your feet as this can dry your skin.  Apply a moisturizing lotion or petroleum jelly (that does not contain alcohol and is unscented) to the skin on your feet and to dry, brittle toenails. Do not apply lotion between your toes.  Trim your toenails straight across. Do not dig under them or around the cuticle. File the edges of your nails with an emery board or nail file.  Do not cut corns or calluses or try to remove them with medicine.  Wear clean socks or stockings every day. Make sure they are not too tight. Do not wear knee-high stockings since they may decrease blood flow to your legs.  Wear shoes that fit properly and have enough cushioning. To break in new shoes, wear them for just a few hours a day. This prevents you from injuring your feet. Always look in your shoes before you put them on to be sure there are no objects inside.  Do not cross your legs. This may decrease the blood flow to your feet.  If you find a minor scrape,  cut, or break in the skin on your feet, keep it and the skin around it clean and dry. These areas may be cleansed with mild soap and water. Do not cleanse the area with peroxide, alcohol, or iodine.  When you remove an adhesive bandage, be sure not to damage the skin around it.  If you have a wound, look at it several times a day to make sure it is healing.  Do not use heating pads or hot water bottles. They may burn your skin. If you have lost feeling in your feet or legs, you may not know it is happening until it is too late.  Make sure your health care provider performs a complete foot exam at least annually or more often if you have foot problems. Report any cuts, sores, or bruises to your health care provider immediately. SEEK MEDICAL CARE IF:   You have an injury that is not healing.  You have cuts or breaks in the skin.  You have an ingrown nail.  You notice redness on your legs or feet.  You feel burning or tingling in your legs or feet.  You have pain or cramps in your legs and feet.  Your legs or feet are numb.  Your feet always feel cold. SEEK IMMEDIATE MEDICAL CARE IF:   There is increasing redness,   swelling, or pain in or around a wound.  There is a red line that goes up your leg.  Pus is coming from a wound.  You develop a fever or as directed by your health care provider.  You notice a bad smell coming from an ulcer or wound. Document Released: 05/29/2000 Document Revised: 02/01/2013 Document Reviewed: 11/08/2012 Exit[employer] Patient Information 2014 [employer].  

## 2013-09-28 DIAGNOSIS — F3131 Bipolar disorder, current episode depressed, mild: Secondary | ICD-10-CM | POA: Diagnosis not present

## 2013-09-28 DIAGNOSIS — E785 Hyperlipidemia, unspecified: Secondary | ICD-10-CM | POA: Diagnosis not present

## 2013-09-28 DIAGNOSIS — Z7901 Long term (current) use of anticoagulants: Secondary | ICD-10-CM | POA: Diagnosis not present

## 2013-09-28 DIAGNOSIS — D689 Coagulation defect, unspecified: Secondary | ICD-10-CM | POA: Diagnosis not present

## 2013-09-28 DIAGNOSIS — I2699 Other pulmonary embolism without acute cor pulmonale: Secondary | ICD-10-CM | POA: Diagnosis not present

## 2013-10-02 DIAGNOSIS — E1149 Type 2 diabetes mellitus with other diabetic neurological complication: Secondary | ICD-10-CM | POA: Diagnosis not present

## 2013-10-10 DIAGNOSIS — T887XXA Unspecified adverse effect of drug or medicament, initial encounter: Secondary | ICD-10-CM | POA: Diagnosis not present

## 2013-10-11 DIAGNOSIS — Z79899 Other long term (current) drug therapy: Secondary | ICD-10-CM | POA: Diagnosis not present

## 2013-10-17 DIAGNOSIS — F3131 Bipolar disorder, current episode depressed, mild: Secondary | ICD-10-CM | POA: Diagnosis not present

## 2013-11-01 DIAGNOSIS — D689 Coagulation defect, unspecified: Secondary | ICD-10-CM | POA: Diagnosis not present

## 2013-11-01 DIAGNOSIS — I209 Angina pectoris, unspecified: Secondary | ICD-10-CM | POA: Diagnosis not present

## 2013-11-01 DIAGNOSIS — F3131 Bipolar disorder, current episode depressed, mild: Secondary | ICD-10-CM | POA: Diagnosis not present

## 2013-11-01 DIAGNOSIS — I2699 Other pulmonary embolism without acute cor pulmonale: Secondary | ICD-10-CM | POA: Diagnosis not present

## 2013-11-13 DIAGNOSIS — Z79899 Other long term (current) drug therapy: Secondary | ICD-10-CM | POA: Diagnosis not present

## 2013-11-30 DIAGNOSIS — I2782 Chronic pulmonary embolism: Secondary | ICD-10-CM | POA: Diagnosis not present

## 2013-11-30 DIAGNOSIS — F3131 Bipolar disorder, current episode depressed, mild: Secondary | ICD-10-CM | POA: Diagnosis not present

## 2013-11-30 DIAGNOSIS — I2699 Other pulmonary embolism without acute cor pulmonale: Secondary | ICD-10-CM | POA: Diagnosis not present

## 2013-11-30 DIAGNOSIS — E1149 Type 2 diabetes mellitus with other diabetic neurological complication: Secondary | ICD-10-CM | POA: Diagnosis not present

## 2013-12-11 DIAGNOSIS — Z79899 Other long term (current) drug therapy: Secondary | ICD-10-CM | POA: Diagnosis not present

## 2013-12-28 DIAGNOSIS — I2782 Chronic pulmonary embolism: Secondary | ICD-10-CM | POA: Diagnosis not present

## 2013-12-28 DIAGNOSIS — I209 Angina pectoris, unspecified: Secondary | ICD-10-CM | POA: Diagnosis not present

## 2014-01-02 DIAGNOSIS — Z79899 Other long term (current) drug therapy: Secondary | ICD-10-CM | POA: Diagnosis not present

## 2014-01-17 ENCOUNTER — Ambulatory Visit: Payer: Medicaid Other | Admitting: Podiatry

## 2014-02-06 DIAGNOSIS — Z79899 Other long term (current) drug therapy: Secondary | ICD-10-CM | POA: Diagnosis not present

## 2014-02-27 ENCOUNTER — Emergency Department: Payer: Self-pay | Admitting: Emergency Medicine

## 2014-02-27 DIAGNOSIS — L03119 Cellulitis of unspecified part of limb: Secondary | ICD-10-CM | POA: Diagnosis not present

## 2014-02-27 DIAGNOSIS — Z880 Allergy status to penicillin: Secondary | ICD-10-CM | POA: Diagnosis not present

## 2014-02-27 DIAGNOSIS — F209 Schizophrenia, unspecified: Secondary | ICD-10-CM | POA: Diagnosis not present

## 2014-02-27 DIAGNOSIS — Z79899 Other long term (current) drug therapy: Secondary | ICD-10-CM | POA: Diagnosis not present

## 2014-02-27 DIAGNOSIS — T8140XA Infection following a procedure, unspecified, initial encounter: Secondary | ICD-10-CM | POA: Diagnosis not present

## 2014-02-27 DIAGNOSIS — L02519 Cutaneous abscess of unspecified hand: Secondary | ICD-10-CM | POA: Diagnosis not present

## 2014-03-03 ENCOUNTER — Emergency Department: Payer: Self-pay | Admitting: Emergency Medicine

## 2014-03-03 DIAGNOSIS — Z79899 Other long term (current) drug therapy: Secondary | ICD-10-CM | POA: Diagnosis not present

## 2014-03-03 DIAGNOSIS — Z880 Allergy status to penicillin: Secondary | ICD-10-CM | POA: Diagnosis not present

## 2014-03-03 DIAGNOSIS — E119 Type 2 diabetes mellitus without complications: Secondary | ICD-10-CM | POA: Diagnosis not present

## 2014-03-03 DIAGNOSIS — Z794 Long term (current) use of insulin: Secondary | ICD-10-CM | POA: Diagnosis not present

## 2014-03-03 DIAGNOSIS — I10 Essential (primary) hypertension: Secondary | ICD-10-CM | POA: Diagnosis not present

## 2014-03-03 DIAGNOSIS — IMO0002 Reserved for concepts with insufficient information to code with codable children: Secondary | ICD-10-CM | POA: Diagnosis not present

## 2014-03-03 DIAGNOSIS — M79609 Pain in unspecified limb: Secondary | ICD-10-CM | POA: Diagnosis not present

## 2014-03-03 DIAGNOSIS — Z7901 Long term (current) use of anticoagulants: Secondary | ICD-10-CM | POA: Diagnosis not present

## 2014-03-03 DIAGNOSIS — M7989 Other specified soft tissue disorders: Secondary | ICD-10-CM | POA: Diagnosis not present

## 2014-03-03 LAB — CBC
HCT: 47.5 % (ref 40.0–52.0)
HGB: 15.7 g/dL (ref 13.0–18.0)
MCH: 30.6 pg (ref 26.0–34.0)
MCHC: 33 g/dL (ref 32.0–36.0)
MCV: 93 fL (ref 80–100)
Platelet: 222 10*3/uL (ref 150–440)
RBC: 5.13 10*6/uL (ref 4.40–5.90)
RDW: 13.2 % (ref 11.5–14.5)
WBC: 6.8 10*3/uL (ref 3.8–10.6)

## 2014-03-03 LAB — COMPREHENSIVE METABOLIC PANEL
Albumin: 4.2 g/dL (ref 3.4–5.0)
Alkaline Phosphatase: 67 U/L
Anion Gap: 12 (ref 7–16)
BILIRUBIN TOTAL: 0.3 mg/dL (ref 0.2–1.0)
BUN: 16 mg/dL (ref 7–18)
Calcium, Total: 9.5 mg/dL (ref 8.5–10.1)
Chloride: 110 mmol/L — ABNORMAL HIGH (ref 98–107)
Co2: 19 mmol/L — ABNORMAL LOW (ref 21–32)
Creatinine: 1.18 mg/dL (ref 0.60–1.30)
EGFR (African American): >60
Glucose: 72 mg/dL (ref 65–99)
Osmolality: 281 (ref 275–301)
Potassium: 4.1 mmol/L (ref 3.5–5.1)
SGOT(AST): 19 U/L (ref 15–37)
SGPT (ALT): 25 U/L
Sodium: 141 mmol/L (ref 136–145)
Total Protein: 7.4 g/dL (ref 6.4–8.2)

## 2014-03-13 DIAGNOSIS — I2782 Chronic pulmonary embolism: Secondary | ICD-10-CM | POA: Diagnosis not present

## 2014-03-13 DIAGNOSIS — Z7901 Long term (current) use of anticoagulants: Secondary | ICD-10-CM | POA: Diagnosis not present

## 2014-03-13 DIAGNOSIS — Z8659 Personal history of other mental and behavioral disorders: Secondary | ICD-10-CM | POA: Diagnosis not present

## 2014-03-13 DIAGNOSIS — F3131 Bipolar disorder, current episode depressed, mild: Secondary | ICD-10-CM | POA: Diagnosis not present

## 2014-03-13 DIAGNOSIS — I10 Essential (primary) hypertension: Secondary | ICD-10-CM | POA: Diagnosis not present

## 2014-04-05 DIAGNOSIS — Z79899 Other long term (current) drug therapy: Secondary | ICD-10-CM | POA: Diagnosis not present

## 2014-04-23 ENCOUNTER — Ambulatory Visit: Payer: Medicare Other | Admitting: Podiatry

## 2014-05-07 ENCOUNTER — Ambulatory Visit (INDEPENDENT_AMBULATORY_CARE_PROVIDER_SITE_OTHER): Payer: Medicare Other | Admitting: Podiatry

## 2014-05-07 DIAGNOSIS — Q828 Other specified congenital malformations of skin: Secondary | ICD-10-CM | POA: Diagnosis not present

## 2014-05-07 DIAGNOSIS — Z79899 Other long term (current) drug therapy: Secondary | ICD-10-CM | POA: Diagnosis not present

## 2014-05-07 DIAGNOSIS — B351 Tinea unguium: Secondary | ICD-10-CM | POA: Diagnosis not present

## 2014-05-07 DIAGNOSIS — M79673 Pain in unspecified foot: Secondary | ICD-10-CM

## 2014-05-07 DIAGNOSIS — E119 Type 2 diabetes mellitus without complications: Secondary | ICD-10-CM

## 2014-05-07 NOTE — Progress Notes (Signed)
   Subjective:    Patient ID: Lance [name], male    DOB: 01/08/1976, [age] y.o.   MRN: [id number]  HPI Comments: "I have this hard place on my foot"  Patient states that his PCP sent him here to get toenails cut and callus trimmed plantar right foot. He is diabetic. It does get painful at times.     Review of Systems  All other systems reviewed and are negative.      Objective:   Physical Exam: I have reviewed his past medical history medications allergies surgeries social history and review of systems. Pulses are strongly palpable bilateral neurologic sensorium is intact per since once the monofilament bilateral deep tendon reflexes are intact bilateral muscle strength is 5 over 5 dorsiflexors plantar flexors inverters everters all intrinsic musculature is intact. Orthopedic evaluation Mr. is all joints distal to the ankle a full range of motion without crepitus with mild hallux abductovalgus deformity bilateral. Cutaneous evaluation demonstrates supple well hydrated cutis nails are thick yellow dystrophic with mycotic and painful palpation. Porokeratotic lesions plantar aspect of the bilateral foot is also noted. Porokeratosis sub third MTPJ right foot.        Assessment & Plan:  Assessment: Diabetes mellitus with pain in limb secondary to onychomycosis 1 through 5 bilateral porokeratotic lesions bilateral. Porokeratosis right foot   Plan: Debridement of all reactive hyperkeratotic lesions and debridement of nails 1 through 5 bilateral. Debrided porokeratosis foot.

## 2014-06-19 DIAGNOSIS — Z79899 Other long term (current) drug therapy: Secondary | ICD-10-CM | POA: Diagnosis not present

## 2014-07-18 DIAGNOSIS — Z79899 Other long term (current) drug therapy: Secondary | ICD-10-CM | POA: Diagnosis not present

## 2014-08-06 ENCOUNTER — Other Ambulatory Visit: Payer: Medicare Other

## 2014-08-20 ENCOUNTER — Other Ambulatory Visit: Payer: Medicare Other

## 2014-08-23 ENCOUNTER — Ambulatory Visit: Payer: Medicare Other | Admitting: Podiatry

## 2014-08-30 ENCOUNTER — Ambulatory Visit: Payer: Medicare Other | Admitting: Podiatry

## 2014-09-04 DIAGNOSIS — Z79899 Other long term (current) drug therapy: Secondary | ICD-10-CM | POA: Diagnosis not present

## 2014-09-13 ENCOUNTER — Ambulatory Visit (INDEPENDENT_AMBULATORY_CARE_PROVIDER_SITE_OTHER): Payer: Medicare Other | Admitting: Podiatry

## 2014-09-13 ENCOUNTER — Encounter: Payer: Self-pay | Admitting: Podiatry

## 2014-09-13 DIAGNOSIS — B351 Tinea unguium: Secondary | ICD-10-CM | POA: Diagnosis not present

## 2014-09-13 DIAGNOSIS — Q828 Other specified congenital malformations of skin: Secondary | ICD-10-CM | POA: Diagnosis not present

## 2014-09-13 DIAGNOSIS — M79673 Pain in unspecified foot: Secondary | ICD-10-CM

## 2014-09-13 DIAGNOSIS — E119 Type 2 diabetes mellitus without complications: Secondary | ICD-10-CM

## 2014-09-13 NOTE — Patient Instructions (Signed)
Diabetes and Foot Care Diabetes may cause you to have problems because of poor blood supply (circulation) to your feet and legs. This may cause the skin on your feet to become thinner, break easier, and heal more slowly. Your skin may become dry, and the skin may peel and crack. You may also have nerve damage in your legs and feet causing decreased feeling in them. You may not notice minor injuries to your feet that could lead to infections or more serious problems. Taking care of your feet is one of the most important things you can do for yourself.  HOME CARE INSTRUCTIONS  Wear shoes at all times, even in the house. Do not go barefoot. Bare feet are easily injured.  Check your feet daily for blisters, cuts, and redness. If you cannot see the bottom of your feet, use a mirror or ask someone for help.  Wash your feet with warm water (do not use hot water) and mild soap. Then pat your feet and the areas between your toes until they are completely dry. Do not soak your feet as this can dry your skin.  Apply a moisturizing lotion or petroleum jelly (that does not contain alcohol and is unscented) to the skin on your feet and to dry, brittle toenails. Do not apply lotion between your toes.  Trim your toenails straight across. Do not dig under them or around the cuticle. File the edges of your nails with an emery board or nail file.  Do not cut corns or calluses or try to remove them with medicine.  Wear clean socks or stockings every day. Make sure they are not too tight. Do not wear knee-high stockings since they may decrease blood flow to your legs.  Wear shoes that fit properly and have enough cushioning. To break in new shoes, wear them for just a few hours a day. This prevents you from injuring your feet. Always look in your shoes before you put them on to be sure there are no objects inside.  Do not cross your legs. This may decrease the blood flow to your feet.  If you find a minor scrape,  cut, or break in the skin on your feet, keep it and the skin around it clean and dry. These areas may be cleansed with mild soap and water. Do not cleanse the area with peroxide, alcohol, or iodine.  When you remove an adhesive bandage, be sure not to damage the skin around it.  If you have a wound, look at it several times a day to make sure it is healing.  Do not use heating pads or hot water bottles. They may burn your skin. If you have lost feeling in your feet or legs, you may not know it is happening until it is too late.  Make sure your health care provider performs a complete foot exam at least annually or more often if you have foot problems. Report any cuts, sores, or bruises to your health care provider immediately. SEEK MEDICAL CARE IF:   You have an injury that is not healing.  You have cuts or breaks in the skin.  You have an ingrown nail.  You notice redness on your legs or feet.  You feel burning or tingling in your legs or feet.  You have pain or cramps in your legs and feet.  Your legs or feet are numb.  Your feet always feel cold. SEEK IMMEDIATE MEDICAL CARE IF:   There is increasing redness,   swelling, or pain in or around a wound.  There is a red line that goes up your leg.  Pus is coming from a wound.  You develop a fever or as directed by your health care provider.  You notice a bad smell coming from an ulcer or wound. Document Released: 05/29/2000 Document Revised: 02/01/2013 Document Reviewed: 11/08/2012 [employer] Patient Information 2015 [employer]. This information is not intended to replace advice given to you by your health care provider. Make sure you discuss any questions you have with your health care provider.  

## 2014-09-13 NOTE — Progress Notes (Signed)
Patient ID: Lance [name], male   DOB: 1975-09-22, 39 y.o.   MRN: [id number]  Subjective: 39 y.o.-year-old male returns the office today for painful, elongated, thickened toenails which he cannot trim himself and for painful calluses.. Denies any redness or drainage around the nails/calluses. Denies any acute changes since last appointment and no new complaints today. Denies any systemic complaints such as fevers, chills, nausea, vomiting.   Objective: AAO 3, NAD DP/PT pulses palpable, CRT less than 3 seconds Protective sensation intact with Simms Weinstein monofilament, Achilles tendon reflex intact.  Nails hypertrophic, dystrophic, elongated, brittle, discolored 10. There is tenderness overlying these nails. There is no surrounding erythema or drainage along the nail sites. Bilateral medial hallux and right submet 4 hyperkerotic lesions. Upon debridement no open lesions, drainage, or other signs of infection.  No open lesions or other pre-ulcerative lesions are identified. No other areas of tenderness bilateral lower extremities. No overlying edema, erythema, increased warmth. No pain with calf compression, swelling, warmth, erythema.  Assessment: Patient presents with symptomatic onychomycosis; porokerotosis x 3  Plan: -Treatment options including alternatives, risks, complications were discussed -Nails sharply debrided 10 without complication/bleeding. -Hyperkerotic lesions sharply debrided x 3 without complications/bleeding.  -Discussed daily foot inspection. If there are any changes, to call the office immediately.  -Follow-up in 3 months or sooner if any problems are to arise. In the meantime, encouraged to call the office with any questions, concerns, changes symptoms.

## 2014-10-02 NOTE — Consult Note (Signed)
Brief Consult Note: Diagnosis: anticoagulation due to lupus anticoagulant.   Patient was seen by consultant.   Consult note dictated.   Comments: see dictation. pt with LUPUS anticoagulant. pt has been on coumadin since 2010  he might be raeday to stop, APS is prudent in certain case to do lofe long a/c  although the duration for first event is uncertain.so for now will do lovenox and ask opinion from hem/onc.  Electronic Signatures: Berlinda LastSanchez Gutierrez, Regan Rakersoberto (MD)  (Signed 31-Dec-13 15:32)  Authored: Brief Consult Note   Last Updated: 31-Dec-13 15:32 by Felipa FurnaceSanchez Gutierrez, [place] Moyd (MD)

## 2014-10-03 DIAGNOSIS — Z79899 Other long term (current) drug therapy: Secondary | ICD-10-CM | POA: Diagnosis not present

## 2014-10-05 NOTE — Consult Note (Signed)
Brief Consult Note: Diagnosis: Schizoaffective disorder bipolar type.   Patient was seen by consultant.   Consult note dictated.   Recommend further assessment or treatment.   Orders entered.   Comments: [name] is well known to us. He was just discharged from BMU in full symptoms control. He is in a manic episode now.   PLAN: 1. The patient is on IVC.   2. He is too acute to admit to BMU now. We restarted all his medications. We added Geodon, this is his third antipsychotic. I will continue medication adjustments.   3. Please page me anytime if problems. The patient has been trumatized by the encounter with the police in the ER.  Electronic Signatures: Kristine LineaPucilowska, Jolanta (MD)  (Signed 13-Oct-14 19:56)  Authored: Brief Consult Note   Last Updated: 13-Oct-14 19:56 by Kristine LineaPucilowska, Jolanta (MD)

## 2014-10-05 NOTE — Consult Note (Signed)
PATIENT NAME:  Lance [name], Lance [name] MR#:  [id number] DATE OF BIRTH:  24-Jan-1976  DATE OF CONSULTATION:  06/14/2012  REFERRING PHYSICIAN:  Dr. Berlinda Last.   CONSULTING PHYSICIAN:  Juli Odom R. Sherrlyn Hock, MD  REASON FOR CONSULTATION: History of pulmonary embolus and lupus anticoagulant, on anticoagulation since 2010.   HISTORY OF PRESENT ILLNESS: The patient is a 39 year old Caucasian gentleman with history of poorly controlled hypertension, diabetes, drug dependency, multiple admissions due to behavioral health-related problems and history of pulmonary embolism in 2010. Review of prior records on [facility] system shows that the patient had a CT scan on 12/15/2008 which showed pulmonary embolism in the descending branches of the left pulmonary artery. No obvious provoking factors were reported per the record. At that time, lower extremity Dopplers were negative for DVT. The patient also had hypercoagulable workup which showed positive lupus anticoagulant, otherwise remaining workup including antithrombin, anticardiolipin antibody, factor II mutation, factor V Leiden, protein C and S panel were all unremarkable. The patient has been on anticoagulation since that time, takes Coumadin, being monitored by Dr. Juel Burrow, and the patient states that he has not had any recurrent episodes of PE or other thromboembolic phenomena. He denies any family history of thromboembolic disorders or hypercoagulable states. He denies any known history of malignancy in the past. Currently, appetite is good, denies unintentional weight loss. No new bone pains. No new headaches. No new cough, dysphagia, hemoptysis or chest pain. No new abdominal pain, constipation, blood in stools, blood in urine. Currently admitted to Behavioral Medicine Unit due to complaint of seeing visions of God and hearing God. Currently states that he is feeling overall better.   PAST MEDICAL HISTORY AND PAST SURGICAL HISTORY: As in HPI above. In addition, he has  history of schizophrenia, smoking, hyperlipidemia, history of cocaine abuse, history of marijuana abuse.   HOME MEDICATIONS:  Coumadin 7.5 mg daily, simvastatin 40 mg daily, metformin 1000 mg b.i.d., lisinopril 2.5 mg daily, fish oil 1000 mg b.i.d., benztropine 2 mg twice daily, Advair Diskus 250/50 inhaler twice daily, aspirin 81 mg daily, clozapine 100 mg daily, Levemir, lithium 600 mg daily, Saphris 5 mg sublingual, trazodone 100 mg daily.   ALLERGIES: INCLUDE PENICILLIN.   FAMILY HISTORY: Mother had lung cancer. Father had heart disease in his late 44s. Denies other hematological disorders or malignancy. Denies known history of hypercoagulable or thrombophilic states.    SOCIAL HISTORY: Chronic smoker, 1 pack per day. History of substance abuse as above. Lives in a group home.   REVIEW OF SYSTEMS: CONSTITUTIONAL: As in HPI.  Denies fevers or night sweats.  HEENT: Denies any headaches, dizziness, epistaxis, ear or jaw pain.  CARDIAC: Denies any angina, orthopnea, palpitations or PND.  LUNGS: Denies any new or progressive cough or shortness of breath. No hemoptysis or chest pain.  GASTROINTESTINAL: No nausea, vomiting, or diarrhea. No constipation or blood in stools.  GENITOURINARY: No dysuria or hematuria.  SKIN: Denies any new rashes or pruritus.  HEMATOLOGIC: Denies any obvious bleeding symptoms.  NEUROLOGIC: Denies any new focal weakness, seizures or loss of consciousness.  MUSCULOSKELETAL: No new bone or joint pains.  EXTREMITIES: No new swelling or pain.   PHYSICAL EXAMINATION: GENERAL: The patient is a moderately built and nourished young individual, alert and cooperative, and answers questions appropriately. No acute distress. No icterus or pallor.  VITAL SIGNS: Are 97.5, 86, 18, 108/71,  HEENT: Normocephalic, atraumatic. Extraocular movements intact. Sclera anicteric. No oral thrush.  NECK: Negative for lymphadenopathy.  CARDIOVASCULAR: S1, S2, regular  rate and rhythm.  LUNGS:  Lungs show bilateral good air entry, no crepitations or rhonchi noted.  ABDOMEN: Soft, no hepatosplenomegaly clinically.  LYMPHATICS: No adenopathy in axillary or inguinal areas.  EXTREMITIES: No major edema or cyanosis.  SKIN: No generalized rashes or major bruising.   LABORATORY RESULTS:  On December 28th, creatinine 0.75, calcium 8.7. Liver functions unremarkable. Tylenol level less than 2. WBC 6500, hemoglobin 15.9, platelets 237, unremarkable differential.   July 2010 workup had shown lupus anticoagulant was positive. Otherwise, antithrombin III, anticardiolipin antibody, protein C and S panel, factor II mutation, factor V Leiden all unremarkable.   IMPRESSION AND RECOMMENDATION: A 39 year old gentleman with history of multiple medical problems as described above, with history of left-sided pulmonary embolism in July 2010 which seems unprovoked by history. Hypercoagulable workup done at that time did show presence of lupus anticoagulant, raising possibility of antiphospholipid antibody syndrome. The patient has been on warfarin anticoagulation since then with no recurrent episodes of thromboembolic phenomena. Given unprovoked pulmonary embolism along with the presence of antiphospholipid antibody, recommendation would be to continue on long-term anticoagulation unless he develops definite contraindication in the future to warfarin therapy. I do not see the need for any further workup or continued hematology followup at this time. Will sign off the case. Please reconsult if needed in the future. Patient was strongly advised to completely quit smoking, states he will try on his own. The patient explained above, agreeable to this plan.   Thank you for the referral. Please feel free to contact me if any additional questions.     ____________________________ Maren ReamerSandeep R. Sherrlyn HockPandit, MD srp:cs D: 06/14/2012 18:49:00 ET T: 06/14/2012 19:15:14 ET JOB#: [id number]  cc: Jahon Bart R. Sherrlyn HockPandit, MD,  <Dictator> Wille CelesteSANDEEP R Rital Cavey MD ELECTRONICALLY SIGNED 06/15/2012 11:33

## 2014-10-05 NOTE — Consult Note (Signed)
Brief Consult Note: Diagnosis: Schizoaffective disorder bipolar type.   Patient was seen by consultant.   Recommend further assessment or treatment.   Orders entered.   Comments: Will admitt to psychiatry.  Electronic Signatures: Kristine LineaPucilowska, Alistar Mcenery (MD)  (Signed 31-Mar-14 13:41)  Authored: Brief Consult Note   Last Updated: 31-Mar-14 13:41 by Kristine LineaPucilowska, Erez Mccallum (MD)

## 2014-10-05 NOTE — Discharge Summary (Signed)
PATIENT NAME:  Lance [name], Lance [name] MR#:  [id number] DATE OF BIRTH:  Oct 20, 1975  DATE OF ADMISSION:  09/17/2012 DATE OF DISCHARGE:  09/28/2012  HOSPITAL COURSE: See dictated history and physical for details of admission. This 39 year old man has a longstanding history of schizophrenia with multiple admissions. He came into the hospital reporting that he was very anxious. He returned to the hospital within only a couple days of being discharged saying that as soon as he got back, he started having panic attacks. Eventually he was able to describe to me that he felt that the panic attacks were in part related to this specific group home and the fact that he feels like they demand too much of him. Whether that is the case, the patient did not want to go back to his current group home. We tried to work with him on considering that as a reasonable option, but he was really very strongly opposed. Meanwhile, he continued to complain of anxiety symptoms with what he called panic attacks. He did seem to be somewhat obsessively anxious and worried. Medicines were adjusted in the hospital to some extent. He had already been on some Klonopin, but this was made standing at 0.5 mg 3 times a day. Additionally, I started him on Paxil 25 mg CR once a day. He tolerated these well and gradually he did seem to have improvement in his anxiety symptoms. He did not have any worsening of his psychotic symptoms. In fact, he probably became easier to communicate with as the days went by. He did not express any active suicidal intent and at discharge was denying any suicidal ideation or homicidal ideation and appeared to be pretty stable and cooperative. He had a pass to his new group home prior to discharge and he liked the place, increasing the chances that this will work out.   DISCHARGE MEDICATIONS: Paroxetine 25 mg CR once a day, fish oil 1 gram per day, Klonopin 0.5 mg 3 times a day, Coumadin 7.5 mg at 5:00 [name].m., trazodone 100 mg at  bedtime, simvastatin 40 mg at night, metformin 1000 mg twice a day, lithium 900 mg at night, lisinopril 2.5 mg per day, insulin Levemir 14 units every day at 7:30 in the morning IM, Advair Diskus 250/50 one puff b.i.d., Clozaril 100 mg b.i.d. and 300 mg at bedtime, Tranxene 7.5 mg at night, aspirin 81 mg per day.   LABORATORY RESULTS:  Admission labs show acetaminophen nondetected. Chemistry panel was slightly high, glucose at 114, elevated chloride 109. Alcohol undetected. CBC normal. Salicylates low. Thyroid-stimulating hormone normal at 1.57. Drug screen positive for benzodiazepines consistent with what he takes. A PT on admission was 19.4 with an INR of 1.7. His neutrophil count stayed normal throughout his hospital stay. Blood glucose stayed in the 100 to 150 range for the most part. PT most recently before discharge was 15.5 with an INR of 1.2 but it had gone up and down a bit during his stay.   MENTAL STATUS EXAM AT DISCHARGE: Casually dressed, neatly groomed man looks his stated age or older. Cooperative with the interview. Good eye contact. Psychomotor activity a little fidgety. Affect flat as usual. Mood stated as fine. Thoughts a little bit anxious and repetitive as usual. Not grossly bizarre. No obvious thought disorder. No suicidal or homicidal ideation expressed. Denies current hallucinations. Shows improved insight and judgment. Normal intelligence. Adequate short and long-term memory.   DISPOSITION:  Discharged to a new group home. Continue to follow up with  the ACT team from [facility].   DIAGNOSIS, PRINCIPAL AND PRIMARY:  AXIS I:  Schizophrenia, undifferentiated.   SECONDARY DIAGNOSES: AXIS I:  No further.   AXIS II:  Deferred.   AXIS III:  Diabetes, hypertension, dyslipidemia, chronic obstructive pulmonary disease.   AXIS IV:  Moderate chronic stress, especially from relocation.   AXIS V:  Functioning at time of discharge 54.    ____________________________ Audery Amel,  MD jtc:ce D: 10/15/2012 14:33:25 ET T: 10/15/2012 15:12:30 ET JOB#: [id number]  cc: Audery Amel, MD, <Dictator> Audery Amel MD ELECTRONICALLY SIGNED 10/16/2012 16:01

## 2014-10-05 NOTE — Consult Note (Signed)
PATIENT NAME:  Rhodia AlbrightBROWN, Robbin P MR#:  [id number] DATE OF BIRTH:  Nov 08, 1975  DATE OF CONSULTATION:  06/14/2012  REFERRING PHYSICIAN:  Audery AmelJohn T. Clapacs, MD CONSULTING PHYSICIAN:  Felipa Furnaceoberto Sanchez Gutierrez, MD  REASON FOR CONSULTATION: Management of anticoagulant therapy.   HISTORY OF PRESENT ILLNESS: The patient is a very nice 39 year old gentleman with history of multiple admissions due to behavioral health related problems. The patient comes to be admitted here due to a complaint of he has been seeing visions of God and hearing God. He is stressed with life in general and his thoughts are haunting himself.   HISTORY OF PRESENT ILLNESS: The patient is a very nice 39 year old gentleman. He is known to the service on previous occasions for medicine consultations. He has history of diabetes, drug dependency (cocaine and THC). He has COPD. He has poorly controlled high blood pressure and diabetes and occasional dizziness and nausea. As mentioned above, he comes mostly with the complaint of having delusions and visions of God, for what he is admitted.   The patient comes with current therapy of warfarin 7.5 mg daily and an INR of 1. The patient states that he has been taking that medication, although his levels are subtherapeutic, so I was asked by Dr. Toni Amendlapacs to help in treating his underlying condition.   As far as we know for this patient, he had a pulmonary embolus without DVT in July 2010, for what he was treated with anticoagulation.   The patient had a hypercoagulable workup, which showed a positive antiphospholipid syndrome/lupus anticoagulant. The patient was discharged on that medication and he has been on it for a long period of time. The last time he was admitted, a consultation was done also for the same reason, and Dr. Coy Saunasich Wieting has spoken with Dr. Corky DownsJaved Masoud to ask if he was going to take him off anticoagulation.   The patient is still on that medication. At this moment, I was trying to  call Dr. Juel BurrowMasoud by myself and the clinic is closed. The patient is a very poor historian. He does not really have any information about why he is still taking anticoagulation and if there is any specific other reasons, like possible new thrombotic events.   The patient has been evaluated by me over here and previous consultations have been written. The patient has not been seen by hematology/oncology for a while and we are going to ask for their help. At this moment, Dr. Juel BurrowMasoud is not available to answer my call.   PAST MEDICAL HISTORY:  1.  History of pulmonary embolism.  2.  Lupus anticoagulant.  3.  Diabetes.  4.  Hypertension.  5.  Hyperlipidemia.  6.  Tobacco abuse.  7.  Schizophrenia.  8.  History of cocaine abuse.  9.  History of marijuana abuse.   PAST SURGICAL HISTORY: None.   ALLERGIES: The patient is allergic to PENICILLIN.   CURRENT MEDICATIONS:  1.  Warfarin 7.5 mg once daily. 2.  Trazodone 100 mg once daily.  3.  Simvastatin 40 mg once daily. 4.  Saphris 5 mg sublingual. 5.  Metformin 1000 mg twice daily.  6.  Lithium 300 mg 2 tablets once a day. 7.  Lisinopril 2.5 mg once a day. 8.  Levemir 8 units subcutaneously once before breakfast.  9.  Fish oil 1000 mg twice daily. 10.  Clozapine 100 mg once a day. 11.  Benztropine 2 mg 2 times daily. 12.  Aspirin 81 mg once a day.  13.  Advair Diskus 250 mg twice daily.   SOCIAL HISTORY: The patient continues to smoke about 1 pack a day. He lives in a group home.   FAMILY HISTORY: Positive for lung cancer in his mother. His father apparently is healthy.   REVIEW OF SYSTEMS: A 12-system review of systems is done.  CONSTITUTIONAL: The patient denies any weight loss or weight gain. Positive fatigue.  HEENT: Negative blurry vision, double vision. Negative tinnitus. Negative ear pain. Negative difficulty swallowing. Positive auditory hallucinations.  RESPIRATORY: The patient has chronic cough. No wheezing. No hemoptysis.   CARDIOVASCULAR: No chest pain, no orthopnea, no palpitations, no syncope.  GASTROINTESTINAL: No nausea, vomiting or hematemesis.  GENITOURINARY: No dysuria, hematuria or changes in frequency.  ENDOCRINE: No polyuria, polydipsia or polyphagia. No cold or heat intolerance. He takes medications for diabetes and he states it is well controlled, although in his history, it seems like it has been poorly controlled.  HEMATOLOGIC, LYMPHATIC: No anemia. No bruising. No easy bleeding. He is taking Coumadin.  SKIN: No acne. No skin lesions.  MUSCULOSKELETAL: No significant back pain, gout or swollen joints.  NEUROLOGIC: No numbness, weakness or dysarthria. No CVA. No TIAs. PSYCHIATRIC: Positive schizophrenia. Positive hallucinations. Positive anxiety.   PHYSICAL EXAMINATION:  VITAL SIGNS: Blood pressure of 108/71, pulse 86, respiratory rate 18, temperature 97.5, oxygen saturation normal on room air. Blood sugar around 100 to 90.  GENERAL: The patient is alert, oriented x 3. No acute distress. No respiratory distress. Hemodynamically stable.  HEENT: Pupils are equal and reactive. Extraocular movements are intact. Mucosa moist.  NECK: Supple. No JVD. No thyromegaly. No adenopathy. No carotid bruits. Normal range of motion.  CARDIOVASCULAR: Regular rate and rhythm. No murmurs, rubs or gallops. No displacement of PMI.  LUNGS: Clear without any wheezing or crepitus at this moment. No use of accessory muscles.  ABDOMEN: Soft, nontender, nondistended. No hepatosplenomegaly. No masses. Bowel sounds are positive. No guarding. No rebound.  GENITALIA: Deferred.  EXTREMITIES: No edema, no cyanosis, no clubbing. Pulses +2. Capillary refill less than 3. DTRs +2. No spasticity.  NEUROLOGIC: Cranial nerves II through XII intact. Strength is 5/5 in 4 extremities. No deficits of sensation. Mood is very flat affect. The patient is cooperative. Alert and oriented x 3.   LYMPHATICS: Negative for lymphadenopathy in neck or  supraclavicular areas.  MUSCULOSKELETAL: No significant joint deformity.   LABORATORY, DIAGNOSTIC AND RADIOLOGICAL DATA: Review of data from previous charts show hypercoagulable workup positive for lupus anticoagulant. At this moment, he has a blood sugar of 101, creatinine of 0.75, chloride 108, other electrolytes within normal limits. His ammonia was 56. His LFTs were within normal limits. His lithium was low at 0.59. UDS was overall negative except for MDMA, which could be cross-reacting with some of his medications. Hemoglobin is 15.9, white count 6.6, platelets 237.   ASSESSMENT AND PLAN: A 39 year old gentleman with schizophrenia, diabetes, chronic obstructive pulmonary disease, history of lupus anticoagulant.  The reason for the consult is mostly to manage anticoagulant therapy. I did a researching of his chart. He has been diagnosed with antiphospholipid syndrome. The patient does not really know what he has and why he is taking this medication. He is a very poor historian. I have tried to call Dr. Corky Downs, but his clinic is closed today. I tried to figure out if the patient had multiple events of deep venous thromboses that we were not aware of, and this is unknown. He only had, as far as we  know, 1 episode of pulmonary embolism. As far as the duration of the therapy of the Coumadin, most of the times for simple lupus anticoagulant, you just have to be treated for up to 6 months and then remain on aspirin, although antiphospholipid syndromes, it would not be a bad decision to treat for life. This patient might not be very compliant with treatment anyway, for what I think that he will benefit from being off his anticoagulation and stay just on aspirin. Since I am not aware of other problems and I am really uncertain of the history since he is such a poor historian and I do not have the source of his primary care physician, I am going to put him on 1 mg/kg of Lovenox for now, stop his heparin and  call for a hematology consultation. We are going to keep an eye on this patient as far as his diabetes and chronic obstructive pulmonary disease for now but overall, managing his anticoagulation, we are going to do the 1 mg/kg, stop the warfarin and go from there. We are going to put in a consultation for hematology/oncology.  I spent about an hour with this patient today.    ____________________________ Felipa Furnace, MD rsg:jm D: 06/14/2012 15:51:00 ET T: 06/14/2012 16:30:27 ET JOB#: [id number]  cc: Felipa Furnace, MD, <Dictator> Islam Eichinger Juanda Chance MD ELECTRONICALLY SIGNED 06/16/2012 7:54
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
## 2014-10-05 NOTE — Consult Note (Signed)
PATIENT NAME:  Lance AlbrightBROWN, Nasif P MR#:  [id number] DATE OF BIRTH:  May 11, 1976  DATE OF CONSULTATION:  09/11/2012  CONSULTING PHYSICIAN:  Tenzin Edelman K. Shalena Ezzell, MD  SUBJECTIVE: The patient was seen in consultation in the [facility].  The patient is a 39 year old white male with a long history of mental illness and schizoaffective disorder and had been living at a group home called [facility] for the past 1 month.   CHIEF COMPLAINT: The patient was brought to the Emergency Room with a chief complaint of, "severe anxiety and lots of tension inside me, and they said I am hearing voices.  I don't know about them.  I don't know about that."  According to information obtained from the chart, the patient had been very anxious and pacing and keeps his room clean.  The patient reports that he had been compliant with medications given at the group home.    ALCOHOL AND DRUGS:  Denied.   PAST PSYCHIATRIC HISTORY: The patient reports that he had many inpatients on psychiatry ever since he was 39 years old.  Lots of suicide attempts when he tried to overdose on pills and tried to slash his wrists.    MENTAL STATUS EXAM: The patient is dressed in hospital clothes, alert, and he [facility], but he did not know the day or date.  He could not name [place] [address]; and he said, "I am too anxious to say that."  He does admit feeling depressed, admits feeling hopeless and helpless, admits feeling worthless and useless. He denies any active suicidal plans at this time.  He admits feeling paranoid and suspicious with people around.  He does admit sometimes hearing voices, and he cannot make out what they are.  He denies thought insertion or thought control.  Regarding fire, he reported he will leave.  He could not spell the word "world."  Cognition is below average, and general knowledge and information is below average.  Insight and judgment are guarded versus impaired.     IMPRESSION: Schizoaffective disorder with anxiety and psychosis.       PLAN: Continue current medications.  Consider the patient for inpatient hospitalization when a bed is available.   ____________________________ Jannet MantisSurya K. Guss [place], MD skc:cb D: 09/11/2012 18:48:51 ET T: 09/11/2012 20:18:20 ET JOB#: [id number]  cc: Monika SalkSurya K. Guss [place], MD, <Dictator> Beau FannySURYA K Alexey Rhoads MD ELECTRONICALLY SIGNED 09/24/2012 17:09

## 2014-10-05 NOTE — Discharge Summary (Signed)
PATIENT NAME:  Rhodia AlbrightBROWN, Bryceton P MR#:  [id number] DATE OF BIRTH:  1975/09/27  DATE OF ADMISSION:  06/12/2012 DATE OF DISCHARGE:  06/20/2012  HOSPITAL COURSE: See dictated history and physical for details of admission. This 39 year old man with a long-standing history of schizophrenia as well as several medical problems was admitted to the hospital reporting that he was having suicidal ideation with a significant recent increase in his psychosis with increased paranoia and reports of hallucinations. Initially on evaluation, he was somewhat agitated, although not hostile and threatening. He was more paranoid and disorganized and unable to stay on topic. The patient's lithium level was low on admission and I have increased the dose of his lithium. Additionally, I have slightly increased his dose of clozapine to treat his psychosis. The patient was agreeable to these changes and has tolerated medication well. As  far as his mental health problems, he has improved significantly. Although he remains somewhat disorganized and slow in his thinking with significant negative symptoms, he is able to hold a lucid conversation and is no longer agitated. He is not reporting acute hallucinations. His medical problems were addressed with his usual medication treatment and also with the help of an internal medicine consult. The consultant has been following the patient for his anticoagulation as well as his diabetes. Anticoagulant medications had been adjusted slightly. His diabetes has generally been well controlled in the hospital. We have continued his medicine for his blood pressure and dyslipidemia. At this point, the patient is calm and appears psychiatrically and medically stable. He was insisting on having a new group home located for him. We have found a group home that is willing to take him and his ACT team is approving of it. He will be discharged today and will continue to follow up with his ACT team as previously.    DISCHARGE MEDICATIONS: Saphris 5 mg 3 times a day, Cogentin 2 mg twice a day, clozapine 200 mg in the morning and 300 mg at bedtime, Zestril 2.5 mg in the morning, lithium 900 mg at night, omega 3 fatty acid 1 capsule daily, Zocor 40 mg at bedtime, trazodone 100 mg at bedtime, aspirin 81 mg per day, Coumadin 7.5 mg every day at 5:00 p.m., Levemir insulin 14 units subcutaneously every day in the morning and metformin 1000 mg twice a day.   LABORATORY RESULTS: Admission labs showed a drug screen positive for MDMA probably a cross reactant. Lithium was low at 0.59. TSH normal at 1.15. Alcohol undetected. Chemistry panel normal except for a very slightly elevated glucose and CO2. Hematology panel was entirely normal. Follow-up hematology panels have all normal with no sign of a granulocytosis. PTs have been followed serially. For the most part, they have 14 to 15 range. I noticed that today he has one that is elevated at 28.6 and it was elevated yesterday at 26.9 as well.   MENTAL STATUS EXAM AT DISCHARGE: Slightly disheveled man who looks his stated age, cooperative with the interview. Eye contact good. Psychomotor activity a little fidgety. Affect flat. Mood stated as fine. Thoughts are lucid, but slow. Sometimes he gets a little off topic. He did not make any grossly bizarre statements. Denies hallucinations. Denies auditory or visual hallucinations. Denies suicidal or homicidal ideation. Shows improved judgment and insight, but still has chronic impairment. He is alert and oriented x4. Baseline intelligence normal with probable cognitive impairment related to schizophrenia.   DISPOSITION: Discharged to a group home. Follow up with his usual ACT team.  DIAGNOSIS PRINCIPLE AND PRIMARY: AXIS I: Schizophrenia, undifferentiated.   SECONDARY DIAGNOSES:  AXIS I: No further.  AXIS II: Deferred.  AXIS III: Diabetes, hypertension, history of pulmonary embolism requiring anticoagulation and dyslipidemia.   AXIS IV: Moderate from chronic burden of illness.  AXIS V: Functioning at time of discharge 60.     ____________________________ Audery Amel, MD jtc:aw D: 06/20/2012 10:35:50 ET T: 06/20/2012 10:46:09 ET JOB#: [id number]  cc: Audery Amel, MD, <Dictator> Audery Amel MD ELECTRONICALLY SIGNED 06/20/2012 16:59
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
## 2014-11-08 DIAGNOSIS — Z79899 Other long term (current) drug therapy: Secondary | ICD-10-CM | POA: Diagnosis not present

## 2014-12-19 DIAGNOSIS — Z79899 Other long term (current) drug therapy: Secondary | ICD-10-CM | POA: Diagnosis not present

## 2014-12-27 ENCOUNTER — Ambulatory Visit: Payer: Medicare Other

## 2015-01-14 DIAGNOSIS — Z79899 Other long term (current) drug therapy: Secondary | ICD-10-CM | POA: Diagnosis not present

## 2015-01-15 ENCOUNTER — Ambulatory Visit: Payer: Medicare Other

## 2015-02-28 DIAGNOSIS — Z79899 Other long term (current) drug therapy: Secondary | ICD-10-CM | POA: Diagnosis not present

## 2015-03-21 ENCOUNTER — Ambulatory Visit: Payer: Medicare Other

## 2015-04-02 DIAGNOSIS — Z79899 Other long term (current) drug therapy: Secondary | ICD-10-CM | POA: Diagnosis not present

## 2015-04-05 DIAGNOSIS — F209 Schizophrenia, unspecified: Secondary | ICD-10-CM | POA: Diagnosis not present

## 2015-04-29 DIAGNOSIS — I482 Chronic atrial fibrillation: Secondary | ICD-10-CM | POA: Diagnosis not present

## 2015-04-29 DIAGNOSIS — E669 Obesity, unspecified: Secondary | ICD-10-CM | POA: Diagnosis not present

## 2015-04-29 DIAGNOSIS — I2782 Chronic pulmonary embolism: Secondary | ICD-10-CM | POA: Diagnosis not present

## 2015-04-29 DIAGNOSIS — F3131 Bipolar disorder, current episode depressed, mild: Secondary | ICD-10-CM | POA: Diagnosis not present

## 2015-04-29 DIAGNOSIS — I10 Essential (primary) hypertension: Secondary | ICD-10-CM | POA: Diagnosis not present

## 2015-05-13 DIAGNOSIS — Z79899 Other long term (current) drug therapy: Secondary | ICD-10-CM | POA: Diagnosis not present

## 2015-05-28 DIAGNOSIS — E669 Obesity, unspecified: Secondary | ICD-10-CM | POA: Diagnosis not present

## 2015-05-28 DIAGNOSIS — E119 Type 2 diabetes mellitus without complications: Secondary | ICD-10-CM | POA: Diagnosis not present

## 2015-05-28 DIAGNOSIS — I10 Essential (primary) hypertension: Secondary | ICD-10-CM | POA: Diagnosis not present

## 2015-05-28 DIAGNOSIS — I2782 Chronic pulmonary embolism: Secondary | ICD-10-CM | POA: Diagnosis not present

## 2015-06-11 ENCOUNTER — Ambulatory Visit: Payer: Medicare Other | Admitting: Sports Medicine

## 2015-06-14 DIAGNOSIS — Z79899 Other long term (current) drug therapy: Secondary | ICD-10-CM | POA: Diagnosis not present

## 2015-07-02 ENCOUNTER — Ambulatory Visit (INDEPENDENT_AMBULATORY_CARE_PROVIDER_SITE_OTHER): Payer: Medicare Other | Admitting: Sports Medicine

## 2015-07-02 ENCOUNTER — Encounter: Payer: Self-pay | Admitting: Sports Medicine

## 2015-07-02 DIAGNOSIS — Q828 Other specified congenital malformations of skin: Secondary | ICD-10-CM

## 2015-07-02 DIAGNOSIS — E119 Type 2 diabetes mellitus without complications: Secondary | ICD-10-CM

## 2015-07-02 DIAGNOSIS — M79673 Pain in unspecified foot: Secondary | ICD-10-CM | POA: Diagnosis not present

## 2015-07-02 DIAGNOSIS — B351 Tinea unguium: Secondary | ICD-10-CM

## 2015-07-02 NOTE — Progress Notes (Signed)
Patient ID: Lance [name], male   DOB: [birth year], 40 y.o.   MRN: [id number] Subjective: Lance [name] is a 40 y.o. male patient with history of type 2 diabetes who presents to office today complaining of callus and long, painful nails  while ambulating in shoes; unable to trim. Patient states that the glucose reading this morning was 125 mg/dl. Patient denies any new cramping, numbness, burning or tingling in the legs.  There are no active problems to display for this patient.  Current Outpatient Prescriptions on File Prior to Visit  Medication Sig Dispense Refill  . CLONAZEPAM PO Take by mouth.    . INSULIN REGULAR HUMAN IJ Inject as directed.    Marland Kitchen LITHIUM PO Take by mouth.    . Omega-3 Fatty Acids (FISH OIL PO) Take by mouth.    . TRAZODONE HCL PO Take by mouth.    Marland Kitchen UNKNOWN TO PATIENT Cholesterol pill    . UNKNOWN TO PATIENT Blood Pressure pill     No current facility-administered medications on file prior to visit.   Allergies  Allergen Reactions  . Penicillins    Labs: HEMOGLOBIN A1C- No recent lab on file  Objective: General: Patient is awake, alert, and oriented x 3 and in no acute distress.  Integument: Skin is warm, dry and supple bilateral. Nails are tender, long, thickened and  dystrophic with subungual debris, consistent with onychomycosis, 1-5 bilateral. No signs of infection. + preulcerative lesions/Callus present right sub met 1 &4, and medial hallux bilateral. Remaining integument unremarkable.  Vasculature:  Dorsalis Pedis pulse 2/4 bilateral. Posterior Tibial pulse  2/4 bilateral.  Capillary fill time <3 sec 1-5 bilateral. Positive hair growth to the level of the digits. Temperature gradient within normal limits. No varicosities present bilateral. No edema present bilateral.   Neurology: The patient has intact sensation measured with a 5.07/10g Semmes Weinstein Monofilament at all pedal sites bilateral . Vibratory sensation intact bilateral with tuning fork. No  Babinski sign present bilateral.   Musculoskeletal: Mild asymptomatic hammertoe pedal deformities noted bilateral. Muscular strength 5/5 in all lower extremity muscular groups bilateral without pain or limitation on range of motion . No tenderness with calf compression bilateral.  Assessment and Plan: Problem List Items Addressed This Visit    None    Visit Diagnoses    Dermatophytosis of nail    -  Primary    Porokeratosis        Diabetes mellitus without complication (HCC)        Foot pain, unspecified laterality          -Examined patient. -Discussed and educated patient on diabetic foot care, especially with  regards to the vascular, neurological and musculoskeletal systems.  -Stressed the importance of good glycemic control and the detriment of not  controlling glucose levels in relation to the foot. -Mechanically debrided callus x 4 using sterile chisel blade and all nails 1-5 bilateral using sterile nail nipper and filed with dremel without incident  -Answered all patient questions -Patient to return in 3 months for at risk foot care -Patient advised to call the office if any problems or questions arise in the meantime.  Landis Martins, DPM

## 2015-07-11 DIAGNOSIS — I2699 Other pulmonary embolism without acute cor pulmonale: Secondary | ICD-10-CM | POA: Diagnosis not present

## 2015-07-11 DIAGNOSIS — E669 Obesity, unspecified: Secondary | ICD-10-CM | POA: Diagnosis not present

## 2015-07-11 DIAGNOSIS — I10 Essential (primary) hypertension: Secondary | ICD-10-CM | POA: Diagnosis not present

## 2015-07-11 DIAGNOSIS — F3131 Bipolar disorder, current episode depressed, mild: Secondary | ICD-10-CM | POA: Diagnosis not present

## 2015-07-15 DIAGNOSIS — Z79899 Other long term (current) drug therapy: Secondary | ICD-10-CM | POA: Diagnosis not present

## 2015-08-13 DIAGNOSIS — Z79899 Other long term (current) drug therapy: Secondary | ICD-10-CM | POA: Diagnosis not present

## 2015-08-15 ENCOUNTER — Emergency Department
Admission: EM | Admit: 2015-08-15 | Discharge: 2015-08-15 | Disposition: A | Discharge status: Home / Self Care | Payer: Medicare Other | Attending: Emergency Medicine | Admitting: Emergency Medicine

## 2015-08-15 ENCOUNTER — Emergency Department: Payer: Medicare Other

## 2015-08-15 DIAGNOSIS — E119 Type 2 diabetes mellitus without complications: Secondary | ICD-10-CM | POA: Diagnosis not present

## 2015-08-15 DIAGNOSIS — Z79899 Other long term (current) drug therapy: Secondary | ICD-10-CM | POA: Diagnosis not present

## 2015-08-15 DIAGNOSIS — F17210 Nicotine dependence, cigarettes, uncomplicated: Secondary | ICD-10-CM | POA: Diagnosis not present

## 2015-08-15 DIAGNOSIS — Z794 Long term (current) use of insulin: Secondary | ICD-10-CM | POA: Diagnosis not present

## 2015-08-15 DIAGNOSIS — B349 Viral infection, unspecified: Secondary | ICD-10-CM | POA: Insufficient documentation

## 2015-08-15 DIAGNOSIS — I10 Essential (primary) hypertension: Secondary | ICD-10-CM | POA: Diagnosis not present

## 2015-08-15 DIAGNOSIS — R112 Nausea with vomiting, unspecified: Secondary | ICD-10-CM | POA: Diagnosis not present

## 2015-08-15 DIAGNOSIS — Z880 Allergy status to penicillin: Secondary | ICD-10-CM | POA: Diagnosis not present

## 2015-08-15 DIAGNOSIS — R0602 Shortness of breath: Secondary | ICD-10-CM | POA: Diagnosis not present

## 2015-08-15 HISTORY — DX: Hyperlipidemia, unspecified: E78.5

## 2015-08-15 HISTORY — DX: Essential (primary) hypertension: I10

## 2015-08-15 LAB — CBC
HEMATOCRIT: 44.4 % (ref 40.0–52.0)
Hemoglobin: 15.6 g/dL (ref 13.0–18.0)
MCH: 31.2 pg (ref 26.0–34.0)
MCHC: 35.1 g/dL (ref 32.0–36.0)
MCV: 88.9 fL (ref 80.0–100.0)
PLATELETS: 158 10*3/uL (ref 150–440)
RBC: 4.99 MIL/uL (ref 4.40–5.90)
RDW: 13.1 % (ref 11.5–14.5)
WBC: 6.3 10*3/uL (ref 3.8–10.6)

## 2015-08-15 LAB — COMPREHENSIVE METABOLIC PANEL
ALT: 31 U/L (ref 17–63)
ANION GAP: 11 (ref 5–15)
AST: 26 U/L (ref 15–41)
Albumin: 4.7 g/dL (ref 3.5–5.0)
Alkaline Phosphatase: 56 U/L (ref 38–126)
BILIRUBIN TOTAL: 0.8 mg/dL (ref 0.3–1.2)
BUN: 17 mg/dL (ref 6–20)
CHLORIDE: 104 mmol/L (ref 101–111)
CO2: 24 mmol/L (ref 22–32)
CREATININE: 1.1 mg/dL (ref 0.61–1.24)
Calcium: 9.7 mg/dL (ref 8.9–10.3)
Glucose, Bld: 181 mg/dL — ABNORMAL HIGH (ref 65–99)
POTASSIUM: 3.8 mmol/L (ref 3.5–5.1)
Sodium: 139 mmol/L (ref 135–145)
Total Protein: 7.2 g/dL (ref 6.5–8.1)

## 2015-08-15 LAB — URINALYSIS COMPLETE WITH MICROSCOPIC (ARMC ONLY)
BACTERIA UA: NONE SEEN
Bilirubin Urine: NEGATIVE
GLUCOSE, UA: NEGATIVE mg/dL
HGB URINE DIPSTICK: NEGATIVE
Leukocytes, UA: NEGATIVE
Nitrite: NEGATIVE
PH: 5 (ref 5.0–8.0)
PROTEIN: 100 mg/dL — AB
SPECIFIC GRAVITY, URINE: 1.028 (ref 1.005–1.030)

## 2015-08-15 LAB — LITHIUM LEVEL: LITHIUM LVL: 0.7 mmol/L (ref 0.60–1.20)

## 2015-08-15 LAB — LIPASE, BLOOD: LIPASE: 17 U/L (ref 11–51)

## 2015-08-15 MED ORDER — IPRATROPIUM-ALBUTEROL 0.5-2.5 (3) MG/3ML IN SOLN
3.0000 mL | Freq: Once | RESPIRATORY_TRACT | Status: AC
  Administered 2015-08-15: 3 mL via RESPIRATORY_TRACT
  Filled 2015-08-15: qty 3

## 2015-08-15 MED ORDER — ONDANSETRON HCL 4 MG PO TABS: 4.0000 mg | ORAL_TABLET | Freq: Every day | ORAL | Status: DC | PRN

## 2015-08-15 MED ORDER — ALBUTEROL SULFATE HFA 108 (90 BASE) MCG/ACT IN AERS: 2.0000 | INHALATION_SPRAY | Freq: Four times a day (QID) | RESPIRATORY_TRACT | Status: DC | PRN

## 2015-08-15 MED ORDER — SODIUM CHLORIDE 0.9 % IV BOLUS (SEPSIS)
1000.0000 mL | Freq: Once | INTRAVENOUS | Status: AC
  Administered 2015-08-15: 1000 mL via INTRAVENOUS

## 2015-08-15 MED ORDER — ONDANSETRON HCL 4 MG/2ML IJ SOLN
4.0000 mg | Freq: Once | INTRAMUSCULAR | Status: AC
  Administered 2015-08-15: 4 mg via INTRAVENOUS
  Filled 2015-08-15: qty 2

## 2015-08-15 NOTE — Discharge Instructions (Signed)
Viral Infections °A viral infection can be caused by different types of viruses. Most viral infections are not serious and resolve on their own. However, some infections may cause severe symptoms and may lead to further complications. °SYMPTOMS °Viruses can frequently cause: °· Minor sore throat. °· Aches and pains. °· Headaches. °· Runny nose. °· Different types of rashes. °· Watery eyes. °· Tiredness. °· Cough. °· Loss of appetite. °· Gastrointestinal infections, resulting in nausea, vomiting, and diarrhea. °These symptoms do not respond to antibiotics because the infection is not caused by bacteria. However, you might catch a bacterial infection following the viral infection. This is sometimes called a "superinfection." Symptoms of such a bacterial infection may include: °· Worsening sore throat with pus and difficulty swallowing. °· Swollen neck glands. °· Chills and a high or persistent fever. °· Severe headache. °· Tenderness over the sinuses. °· Persistent overall ill feeling (malaise), muscle aches, and tiredness (fatigue). °· Persistent cough. °· Yellow, green, or Brekke mucus production with coughing. °HOME CARE INSTRUCTIONS  °· Only take over-the-counter or prescription medicines for pain, discomfort, diarrhea, or fever as directed by your caregiver. °· Drink enough water and fluids to keep your urine clear or pale yellow. Sports drinks can provide valuable electrolytes, sugars, and hydration. °· Get plenty of rest and maintain proper nutrition. Soups and broths with crackers or rice are fine. °SEEK IMMEDIATE MEDICAL CARE IF:  °· You have severe headaches, shortness of breath, chest pain, neck pain, or an unusual rash. °· You have uncontrolled vomiting, diarrhea, or you are unable to keep down fluids. °· You or your child has an oral temperature above 102° F (38.9° C), not controlled by medicine. °· Your baby is older than 3 months with a rectal temperature of 102° F (38.9° C) or higher. °· Your baby is 3  months old or younger with a rectal temperature of 100.4° F (38° C) or higher. °MAKE SURE YOU:  °· Understand these instructions. °· Will watch your condition. °· Will get help right away if you are not doing well or get worse. °  °This information is not intended to replace advice given to you by your health care provider. Make sure you discuss any questions you have with your health care provider. °  °Document Released: 03/11/2005 Document Revised: 08/24/2011 Document Reviewed: 11/07/2014 °Elsevier Interactive Patient Education ©2016 [employer] Inc[employer] ° °

## 2015-08-15 NOTE — ED Notes (Signed)
Pt states he is from a group home and the staff there would not bring him here for Tx so he walked to the police department and they brought him here..  Pt c/o cough, vomiting since last night with dizziness and generalized weakness.Marland Kitchen

## 2015-08-15 NOTE — ED Provider Notes (Signed)
[facility] Emergency Department Provider Note  ____________________________________________   I have reviewed the triage vital signs and the nursing notes.   HISTORY  Chief Complaint Cough and Emesis    HPI Lance [name] is a 40 y.o. male with a history of diabetes mellitus hypertension and hyperlipidemia, who is on lithium and lives in a group home. Patient presents today with nausea vomiting diarrhea for a few days. He is also had a slight cough. He denies any hematemesis, he started feeling bad yesterday. Document a fever but felt somewhat warm.. No melena or  bright red blood per rectum. Positive rhinorrhea. Diffuse flulike symptoms and otherwise. Patient does not feel mildly dehydrated. He biked to eat and drink. He is a chronic daily smoker.     Past Medical History  Diagnosis Date  . Diabetes mellitus without complication (HCC)   . Hypertension   . Hyperlipidemia     There are no active problems to display for this patient.   History reviewed. No pertinent past surgical history.  Current Outpatient Rx  Name  Route  Sig  Dispense  Refill  . CLONAZEPAM PO   Oral   Take by mouth.         . INSULIN REGULAR HUMAN IJ   Injection   Inject as directed.         Marland Kitchen LITHIUM PO   Oral   Take by mouth.         . Omega-3 Fatty Acids (FISH OIL PO)   Oral   Take by mouth.         . TRAZODONE HCL PO   Oral   Take by mouth.         Marland Kitchen UNKNOWN TO PATIENT      Cholesterol pill         . UNKNOWN TO PATIENT      Blood Pressure pill           Allergies Penicillins  No family history on file.  Social History Social History  Substance Use Topics  . Smoking status: Current Every Day Smoker -- 1.00 packs/day    Types: Cigarettes  . Smokeless tobacco: None  . Alcohol Use: Yes     Comment: occassionally    Review of Systems Constitutional:  Subjective  Eyes: No visual changes. ENT: No sore throat.  No stiff neck no neck pain Cardiovascular: Denies chest pain. Respiratory: Denies shortness of breath. Gastrointestinal:   no vomiting.  No diarrhea.  No constipation. Genitourinary: Negative for dysuria. Musculoskeletal: Negative lower extremity swelling Skin: Negative for rash. Neurological: Negative for headaches, focal weakness or numbness. 10-point ROS otherwise negative.  ____________________________________________   PHYSICAL EXAM:  VITAL SIGNS: ED Triage Vitals  Enc Vitals Group     BP 08/15/15 0916 137/73 mmHg     Pulse Rate 08/15/15 0916 84     Resp 08/15/15 0916 20     Temp 08/15/15 0916 98.5 F (36.9 C)     Temp Source 08/15/15 0916 Oral     SpO2 08/15/15 0916 93 %     Weight 08/15/15 0916 212 lb (96.163 kg)     Height 08/15/15 0916  (1.778 m)     Head Cir --      Peak Flow --      Pain Score 08/15/15 0916 0     Pain Loc --      Pain Edu? --      Excl. in GC? --  Constitutional: Alert and oriented. Well appearing and in no acute distress. Eyes: Conjunctivae are normal. PERRL. EOMI. Head: Atraumatic. Nose: No congestion/rhinnorhea. Mouth/Throat: Mucous membranes are slightly dry.  Oropharynx non-erythematous. Neck: No stridor.   Nontender with no meningismus Cardiovascular: Normal rate, regular rhythm. Grossly normal heart sounds.  Good peripheral circulation. Respiratory: Normal respiratory effort.  No retractions. Mild occasional diffuse wheeze noted. No rales or rhonchi Abdominal: Soft and nontender. No distention. No guarding no rebound Back:  There is no focal tenderness or step off there is no midline tenderness there are no lesions noted. there is no CVA tenderness Musculoskeletal: No lower extremity tenderness. No joint effusions, no DVT signs strong distal pulses no edema Neurologic:  Normal speech and language. No gross focal neurologic deficits are appreciated.  Skin:  Skin is warm, dry and intact. No rash noted. Psychiatric: Mood and  affect are normal. Speech and behavior are normal.  ____________________________________________   LABS (all labs ordered are listed, but only abnormal results are displayed)  Labs Reviewed  COMPREHENSIVE METABOLIC PANEL - Abnormal; Notable for the following:    Glucose, Bld 181 (*)    All other components within normal limits  URINALYSIS COMPLETEWITH MICROSCOPIC ([facility] ONLY) - Abnormal; Notable for the following:    Color, Urine AMBER (*)    APPearance HAZY (*)    Ketones, ur TRACE (*)    Protein, ur 100 (*)    Squamous Epithelial / LPF 0-5 (*)    All other components within normal limits  LIPASE, BLOOD  CBC  LITHIUM LEVEL   ____________________________________________  EKG  I personally interpreted any EKGs ordered by me or triage ____________________________________________  RADIOLOGY  I reviewed any imaging ordered by me or triage that were performed during my shift ____________________________________________   PROCEDURES  Procedure(s) performed: None  Critical Care performed: None  ____________________________________________   INITIAL IMPRESSION / ASSESSMENT AND PLAN / ED COURSE  Pertinent labs & imaging results that were available during my care of the patient were reviewed by me and considered in my medical decision making (see chart for details).  Very well-appearing gentleman with nausea vomiting diarrhea and cough. Abdomen is benign no surgical pathology noted. The pain level is normal. The glucose is not markedly elevated. No evidence of UTI. No anion gap. Patient with slight wheeze but no evidence of pneumonia on chest x-ray or auscultation. We'll give him a breathing treatment, IV fluid for mild dehydration, anti-emetics and reassess. This is most likely a viral pathology. he is eager to eat food but we will have him hold off until after we can get antiemetics and fluids.  ____________________________________________   FINAL CLINICAL IMPRESSION(S) /  ED DIAGNOSES  Final diagnoses:  None      This chart was dictated using voice recognition software.  Despite best efforts to proofread,  errors can occur which can change meaning.     Jeanmarie Plant, MD 08/15/15 [phone]

## 2015-08-19 DIAGNOSIS — E669 Obesity, unspecified: Secondary | ICD-10-CM | POA: Diagnosis not present

## 2015-08-19 DIAGNOSIS — F3131 Bipolar disorder, current episode depressed, mild: Secondary | ICD-10-CM | POA: Diagnosis not present

## 2015-08-19 DIAGNOSIS — E119 Type 2 diabetes mellitus without complications: Secondary | ICD-10-CM | POA: Diagnosis not present

## 2015-08-19 DIAGNOSIS — I2782 Chronic pulmonary embolism: Secondary | ICD-10-CM | POA: Diagnosis not present

## 2015-09-10 DIAGNOSIS — Z79899 Other long term (current) drug therapy: Secondary | ICD-10-CM | POA: Diagnosis not present

## 2015-09-24 ENCOUNTER — Encounter: Payer: Self-pay | Admitting: Emergency Medicine

## 2015-09-24 ENCOUNTER — Emergency Department
Admission: EM | Admit: 2015-09-24 | Discharge: 2015-09-25 | Disposition: A | Discharge status: Home / Self Care | Payer: Medicare Other | Attending: Emergency Medicine | Admitting: Emergency Medicine

## 2015-09-24 DIAGNOSIS — Z79899 Other long term (current) drug therapy: Secondary | ICD-10-CM | POA: Insufficient documentation

## 2015-09-24 DIAGNOSIS — I10 Essential (primary) hypertension: Secondary | ICD-10-CM | POA: Diagnosis not present

## 2015-09-24 DIAGNOSIS — F250 Schizoaffective disorder, bipolar type: Secondary | ICD-10-CM | POA: Diagnosis not present

## 2015-09-24 DIAGNOSIS — Z7901 Long term (current) use of anticoagulants: Secondary | ICD-10-CM | POA: Insufficient documentation

## 2015-09-24 DIAGNOSIS — R45851 Suicidal ideations: Secondary | ICD-10-CM | POA: Diagnosis present

## 2015-09-24 DIAGNOSIS — F329 Major depressive disorder, single episode, unspecified: Secondary | ICD-10-CM | POA: Diagnosis not present

## 2015-09-24 DIAGNOSIS — E119 Type 2 diabetes mellitus without complications: Secondary | ICD-10-CM | POA: Insufficient documentation

## 2015-09-24 DIAGNOSIS — Z7984 Long term (current) use of oral hypoglycemic drugs: Secondary | ICD-10-CM | POA: Insufficient documentation

## 2015-09-24 DIAGNOSIS — F17210 Nicotine dependence, cigarettes, uncomplicated: Secondary | ICD-10-CM | POA: Insufficient documentation

## 2015-09-24 DIAGNOSIS — E785 Hyperlipidemia, unspecified: Secondary | ICD-10-CM | POA: Insufficient documentation

## 2015-09-24 DIAGNOSIS — F32A Depression, unspecified: Secondary | ICD-10-CM

## 2015-09-24 HISTORY — DX: Schizoaffective disorder, unspecified: F25.9

## 2015-09-24 HISTORY — DX: Schizoaffective disorder, bipolar type: F25.0

## 2015-09-24 HISTORY — DX: Depression, unspecified: F32.A

## 2015-09-24 HISTORY — DX: Major depressive disorder, single episode, unspecified: F32.9

## 2015-09-24 LAB — COMPREHENSIVE METABOLIC PANEL
ALT: 25 U/L (ref 17–63)
AST: 24 U/L (ref 15–41)
Albumin: 4.9 g/dL (ref 3.5–5.0)
Alkaline Phosphatase: 57 U/L (ref 38–126)
Anion gap: 7 (ref 5–15)
BUN: 15 mg/dL (ref 6–20)
CHLORIDE: 109 mmol/L (ref 101–111)
CO2: 23 mmol/L (ref 22–32)
CREATININE: 1.16 mg/dL (ref 0.61–1.24)
Calcium: 9.8 mg/dL (ref 8.9–10.3)
GFR calc non Af Amer: >60 mL/min (ref >60)
GLUCOSE: 122 mg/dL — AB (ref 65–99)
Potassium: 3.7 mmol/L (ref 3.5–5.1)
Sodium: 139 mmol/L (ref 135–145)
Total Bilirubin: 0.6 mg/dL (ref 0.3–1.2)
Total Protein: 7.1 g/dL (ref 6.5–8.1)

## 2015-09-24 LAB — URINE DRUG SCREEN, QUALITATIVE (ARMC ONLY)
Amphetamines, Ur Screen: NOT DETECTED
Barbiturates, Ur Screen: NOT DETECTED
Benzodiazepine, Ur Scrn: NOT DETECTED
CANNABINOID 50 NG, UR ~~LOC~~: NOT DETECTED
COCAINE METABOLITE, UR ~~LOC~~: NOT DETECTED
MDMA (ECSTASY) UR SCREEN: NOT DETECTED
Methadone Scn, Ur: NOT DETECTED
Opiate, Ur Screen: NOT DETECTED
Phencyclidine (PCP) Ur S: NOT DETECTED
TRICYCLIC, UR SCREEN: NOT DETECTED

## 2015-09-24 LAB — CBC WITH DIFFERENTIAL/PLATELET
BASOS ABS: 0 10*3/uL (ref 0–0.1)
BASOS PCT: 0 %
EOS PCT: 0 %
Eosinophils Absolute: 0 10*3/uL (ref 0–0.7)
HEMATOCRIT: 45.5 % (ref 40.0–52.0)
Hemoglobin: 15.9 g/dL (ref 13.0–18.0)
LYMPHS PCT: 29 %
Lymphs Abs: 1.7 10*3/uL (ref 1.0–3.6)
MCH: 31.7 pg (ref 26.0–34.0)
MCHC: 35 g/dL (ref 32.0–36.0)
MCV: 90.6 fL (ref 80.0–100.0)
MONO ABS: 0.6 10*3/uL (ref 0.2–1.0)
MONOS PCT: 11 %
Neutro Abs: 3.5 10*3/uL (ref 1.4–6.5)
Neutrophils Relative %: 60 %
PLATELETS: 184 10*3/uL (ref 150–440)
RBC: 5.02 MIL/uL (ref 4.40–5.90)
RDW: 13.3 % (ref 11.5–14.5)
WBC: 5.8 10*3/uL (ref 3.8–10.6)

## 2015-09-24 LAB — ACETAMINOPHEN LEVEL: Acetaminophen (Tylenol), Serum: <10 ug/mL — ABNORMAL LOW (ref 10–30)

## 2015-09-24 LAB — URINALYSIS COMPLETE WITH MICROSCOPIC (ARMC ONLY)
Bacteria, UA: NONE SEEN
Bilirubin Urine: NEGATIVE
Glucose, UA: NEGATIVE mg/dL
Hgb urine dipstick: NEGATIVE
KETONES UR: NEGATIVE mg/dL
Leukocytes, UA: NEGATIVE
NITRITE: NEGATIVE
PH: 5 (ref 5.0–8.0)
PROTEIN: 30 mg/dL — AB
SPECIFIC GRAVITY, URINE: 1.018 (ref 1.005–1.030)

## 2015-09-24 LAB — ETHANOL: Alcohol, Ethyl (B): <5 mg/dL (ref <5)

## 2015-09-24 LAB — SALICYLATE LEVEL: Salicylate Lvl: <4 mg/dL (ref 2.8–30.0)

## 2015-09-24 LAB — GLUCOSE, CAPILLARY: GLUCOSE-CAPILLARY: 141 mg/dL — AB (ref 65–99)

## 2015-09-24 LAB — LITHIUM LEVEL: LITHIUM LVL: 0.58 mmol/L — AB (ref 0.60–1.20)

## 2015-09-24 MED ORDER — TRAZODONE HCL 100 MG PO TABS
100.0000 mg | ORAL_TABLET | Freq: Once | ORAL | Status: AC
  Administered 2015-09-24: 100 mg via ORAL
  Filled 2015-09-24: qty 1

## 2015-09-24 NOTE — ED Notes (Signed)
Pharm tech spoke with [facility] [phone], owner Corrie DandyMary [phone] to have [facility]. Corrie DandyMary reports fax is broken at group home. Will attempt to call tomorrow [facility].

## 2015-09-24 NOTE — ED Notes (Signed)
The patient was dressed out into the required purple scrubs. His belongings was placed into a white patient belongings bag. He was then walked to rm. 22 without any incidents.

## 2015-09-24 NOTE — ED Notes (Signed)
Pt wanted to brush his teeth. Toothpaste and toothbrush were provided.

## 2015-09-24 NOTE — ED Notes (Signed)
Pt. To [facility] from ED ambulatory without difficulty, to room  [facility]. [facility] Pt. Is alert and oriented, warm and dry in no distress. Pt. Denies SI, HI, and AVH. Pt. Calm and cooperative. Pt. Made aware of security cameras and Q15 minute rounds. Sandwich and drink given. Pt. Encouraged to let Nursing staff know of any concerns or needs.

## 2015-09-24 NOTE — BH Assessment (Signed)
Assessment Note  Lance AlbrightJames P [name] is an 40 y.o. male. Patient was vrought into the ED by [employer] because of suicidal thoughts.  Patient current reports suicidal thoughts with no plan.  "I feel a little suicidal I guess" Patient reports walking to the [employer] station and requesting to be brought to the ED for help.  [facility] and in the last couple months had conflicts with new staff member.  He reports being depressed because of conflicts in his home, financial, no other supports, and no social life.  Patient reports his [name] [facility] [phone].  He reports currently participating with Lance [name] for mental health services.     Pending Disposition    Diagnosis: Major Depressive Disorder, recurrent, moderate  Past Medical History:  Past Medical History  Diagnosis Date  . Diabetes mellitus without complication (HCC)   . Hypertension   . Hyperlipidemia   . Depression   . Schizo affective schizophrenia (HCC)     History reviewed. No pertinent past surgical history.  Family History: No family history on file.  Social History:  reports that he has been smoking Cigarettes.  He has been smoking about 1.00 pack per day. He does not have any smokeless tobacco history on file. He reports that he does not drink alcohol. His drug history is not on file.  Additional Social History:  Alcohol / Drug Use Pain Medications: see chart Prescriptions: see chart Over the Counter: see chart History of alcohol / drug use?: No history of alcohol / drug abuse  CIWA: CIWA-Ar BP: 131/71 mmHg Pulse Rate: 98 COWS:    Allergies:  Allergies  Allergen Reactions  . Penicillins     Home Medications:  (Not in a hospital admission)  OB/GYN Status:  No LMP for male patient.  General Assessment Data [facility] Assessment: In system Is this a Tele or Face-to-Face Assessment?: Face-to-Face Is this an  Initial Assessment or a Re-assessment for this encounter?: Initial Assessment Marital status: Single Is patient pregnant?: No Pregnancy Status: No Living Arrangements: Group Home ([facility]) Can pt return to current living arrangement?: Yes Admission Status: Involuntary Is patient capable of signing voluntary admission?: No Referral Source: Self/Family/Friend Insurance type: MCD  Medical Screening Exam [facility] Walk-in ONLY) Medical Exam completed: Yes  Crisis Care Plan Living Arrangements: Group Home ([facility]) Legal Guardian: Other: ([facility] [place]) Name of Psychiatrist: none  Name of Therapist: none  Education Status Is patient currently in school?: No  Risk to self with the past 6 months Suicidal Ideation: Yes-Currently Present Has patient been a risk to self within the past 6 months prior to admission? : Yes Suicidal Intent: No-Not Currently/Within Last 6 Months Has patient had any suicidal intent within the past 6 months prior to admission? : No Is patient at risk for suicide?: Yes Suicidal Plan?: No-Not Currently/Within Last 6 Months Has patient had any suicidal plan within the past 6 months prior to admission? : No Access to Means: No What has been your use of drugs/alcohol within the last 12 months?: none Previous Attempts/Gestures: Yes How many times?: 1 Other Self Harm Risks: cutting Triggers for Past Attempts: Other personal contacts Intentional Self Injurious Behavior: Cutting Comment - Self Injurious Behavior: past cutting behaviors Family Suicide History: Unknown Recent stressful life event(s): Conflict (Comment), Other (Comment) (conflict with Group Home Manager) Persecutory voices/beliefs?: No Depression: Yes  Depression Symptoms: Loss of interest in usual pleasures, Feeling angry/irritable, Feeling worthless/self pity (hopelessness) Substance abuse history and/or treatment for substance abuse?: No  Risk to Others within the past  6 months Homicidal Ideation: No-Not Currently/Within Last 6 Months Does patient have any lifetime risk of violence toward others beyond the six months prior to admission? : No Thoughts of Harm to Others: No-Not Currently Present/Within Last 6 Months Current Homicidal Intent: No-Not Currently/Within Last 6 Months Current Homicidal Plan: No-Not Currently/Within Last 6 Months Access to Homicidal Means: No Identified Victim: na History of harm to others?: No Assessment of Violence: None Noted Violent Behavior Description: na Does patient have access to weapons?: No Criminal Charges Pending?: No Does patient have a court date: No Is patient on probation?: No  Psychosis Hallucinations: None noted Delusions: None noted  Mental Status Report Appearance/Hygiene: In scrubs Eye Contact: Fair Motor Activity: Freedom of movement Speech: Slow, Slurred Level of Consciousness: Alert Mood: Anxious Affect: Anxious, Irritable Anxiety Level: Minimal Thought Processes: Relevant Judgement: Partial Orientation: Person, Time, Place, Situation Obsessive Compulsive Thoughts/Behaviors: None  Cognitive Functioning Concentration: Fair Memory: Recent Intact, Remote Intact IQ: Average Insight: Poor Impulse Control: Fair Appetite: Fair Weight Loss: 0 Weight Gain: 0 Sleep: No Change Total Hours of Sleep: 6 Vegetative Symptoms: None  ADLScreening [facility] Assessment Services) Patient's cognitive ability adequate to safely complete daily activities?: Yes Patient able to express need for assistance with ADLs?: Yes Independently performs ADLs?: Yes (appropriate for developmental age)  Prior Inpatient Therapy Prior Inpatient Therapy: Yes Prior Therapy Dates: multiple Prior Therapy Facilty/Provider(s): multiple Reason for Treatment: SI/Depression  Prior Outpatient Therapy Prior Outpatient Therapy: Yes Prior Therapy Dates: current  Prior Therapy Facilty/Provider(s): [name] Reason for  Treatment: Depression Does patient have an ACCT team?: Unknown Does patient have Intensive In-House Services?  : No Does patient have [facility] services? : No Does patient have P4CC services?: No  ADL Screening (condition at time of admission) Patient's cognitive ability adequate to safely complete daily activities?: Yes Patient able to express need for assistance with ADLs?: Yes Independently performs ADLs?: Yes (appropriate for developmental age)       Abuse/Neglect Assessment (Assessment to be complete while patient is alone) Physical Abuse: Denies Verbal Abuse: Yes, present (Comment) (Pt reports conflicts with group home manager) Sexual Abuse: Denies Self-Neglect: Denies Values / Beliefs Cultural Requests During Hospitalization: None Spiritual Requests During Hospitalization: None Consults Spiritual Care Consult Needed: No Social Work Consult Needed: Yes (Comment) (Pt is from group [facility] in [place], [place]) Merchant navy officer (For Healthcare) Does patient have an advance directive?: No Would patient like information on creating an advanced directive?: No - patient declined information    Additional Information 1:1 In Past 12 Months?: No CIRT Risk: No Elopement Risk: No Does patient have medical clearance?: Yes     Disposition:  Disposition Initial Assessment Completed for this Encounter: Yes Disposition of Patient: Other dispositions (Pending ) Other disposition(s): Other (Comment) (Pending)  On Site Evaluation by:   Reviewed with Physician:    Maryelizabeth Rowan A 09/24/2015 10:28 PM

## 2015-09-24 NOTE — ED Provider Notes (Addendum)
[facility] Emergency Department Provider Note   ____________________________________________  Time seen: ~1845  I have reviewed the triage vital signs and the nursing notes.   HISTORY  Chief Complaint Suicidal   History limited by: Not Limited   HPI Lance [name] is a 40 y.o. male with history ofschizoaffective schizophrenia and depression presents to the emergency department today because of increased depression and occasional thoughts of suicide. The patient states that he has a long history of depression. He states he did try to hurt himself once when he was 17 by overdosing on pills. He states recently he has had increased stressors at his living facility. They state that they do not treat him well. He states this is constant to think more about wanting to hurt himself. He thinks that he would do that by going out into traffic. The patient denies any medical complaints.    Past Medical History  Diagnosis Date  . Diabetes mellitus without complication (HCC)   . Hypertension   . Hyperlipidemia   . Depression   . Schizo affective schizophrenia (HCC)     There are no active problems to display for this patient.   History reviewed. No pertinent past surgical history.  Current Outpatient Rx  Name  Route  Sig  Dispense  Refill  . albuterol (PROVENTIL HFA;VENTOLIN HFA) 108 (90 Base) MCG/ACT inhaler   Inhalation   Inhale 2 puffs into the lungs every 6 (six) hours as needed for wheezing or shortness of breath.   1 Inhaler   2   . CLONAZEPAM PO   Oral   Take by mouth.         . INSULIN REGULAR HUMAN IJ   Injection   Inject as directed.         Marland Kitchen. LITHIUM PO   Oral   Take by mouth.         . Omega-3 Fatty Acids (FISH OIL PO)   Oral   Take by mouth.         . ondansetron (ZOFRAN) 4 MG tablet   Oral   Take 1 tablet (4 mg total) by mouth daily as needed for nausea or vomiting.   10 tablet   0   . TRAZODONE HCL PO   Oral   Take  by mouth.         Marland Kitchen. UNKNOWN TO PATIENT      Cholesterol pill         . UNKNOWN TO PATIENT      Blood Pressure pill           Allergies Penicillins  No family history on file.  Social History Social History  Substance Use Topics  . Smoking status: Current Every Day Smoker -- 1.00 packs/day    Types: Cigarettes  . Smokeless tobacco: None  . Alcohol Use: No     Comment: occassionally    Review of Systems  Constitutional: Negative for fever. Cardiovascular: Negative for chest pain. Respiratory: Negative for shortness of breath. Gastrointestinal: Negative for abdominal pain, vomiting and diarrhea. Neurological: Negative for headaches, focal weakness or numbness.   10-point ROS otherwise negative.  ____________________________________________   PHYSICAL EXAM:  VITAL SIGNS: ED Triage Vitals  Enc Vitals Group     BP 09/24/15 1831 131/71 mmHg     Pulse Rate 09/24/15 1831 98     Resp 09/24/15 1831 20     Temp 09/24/15 1831 98.5 F (36.9 C)     Temp Source 09/24/15  1831 Oral     SpO2 09/24/15 1831 97 %     Weight 09/24/15 1831 210 lb (95.255 kg)     Height 09/24/15 1831 5' 10.5" (1.791 m)     Head Cir --      Peak Flow --      Pain Score 09/24/15 1831 0   Constitutional: Alert and oriented. Flat affect Eyes: Conjunctivae are normal. PERRL. Normal extraocular movements. ENT   Head: Normocephalic and atraumatic.   Nose: No congestion/rhinnorhea.   Mouth/Throat: Mucous membranes are moist.   Neck: No stridor. Hematological/Lymphatic/Immunilogical: No cervical lymphadenopathy. Cardiovascular: Normal rate, regular rhythm.  No murmurs, rubs, or gallops. Respiratory: Normal respiratory effort without tachypnea nor retractions. Breath sounds are clear and equal bilaterally. No wheezes/rales/rhonchi. Gastrointestinal: Soft and nontender. No distention. There is no CVA tenderness. Genitourinary: Deferred Musculoskeletal: Normal range of motion in  all extremities. No joint effusions.  No lower extremity tenderness nor edema. Neurologic:  Normal speech and language. No gross focal neurologic deficits are appreciated.  Skin:  Skin is warm, dry and intact. No rash noted. Psychiatric: Flat affect. Endorses depression and suicidal ideation  ____________________________________________    LABS (pertinent positives/negatives)  Labs Reviewed  LITHIUM LEVEL - Abnormal; Notable for the following:    Lithium Lvl 0.58 (*)    All other components within normal limits  COMPREHENSIVE METABOLIC PANEL - Abnormal; Notable for the following:    Glucose, Bld 122 (*)    All other components within normal limits  URINALYSIS COMPLETEWITH MICROSCOPIC ([facility] ONLY) - Abnormal; Notable for the following:    Color, Urine YELLOW (*)    APPearance CLEAR (*)    Protein, ur 30 (*)    Squamous Epithelial / LPF 0-5 (*)    All other components within normal limits  ACETAMINOPHEN LEVEL - Abnormal; Notable for the following:    Acetaminophen (Tylenol), Serum <10 (*)    All other components within normal limits  CBC WITH DIFFERENTIAL/PLATELET  SALICYLATE LEVEL  URINE DRUG SCREEN, QUALITATIVE ([facility] ONLY)  ETHANOL     ____________________________________________   EKG  None  ____________________________________________    RADIOLOGY  None  ____________________________________________   PROCEDURES  Procedure(s) performed: None  Critical Care performed: No  ____________________________________________   INITIAL IMPRESSION / ASSESSMENT AND PLAN / ED COURSE  Pertinent labs & imaging results that were available during my care of the patient were reviewed by me and considered in my medical decision making (see chart for details).  Patient presented to the emergency department today because of concerns for depression. It does sound like this depression is somewhat chronic. Is not appear to be in any acute danger to himself although he has had  intermittent thoughts of suicide.  This point will consult psychiatry. Given history of depression and SI will place under IVC  ____________________________________________   FINAL CLINICAL IMPRESSION(S) / ED DIAGNOSES  Final diagnoses:  Depression     Phineas Semen, MD 09/24/15 0981  Phineas Semen, MD 09/24/15 2155
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
## 2015-09-24 NOTE — ED Notes (Signed)
Patient presents to the ED via [place] [employer].  Patient states he has been having bad thoughts and suicidal thoughts.  Denies any plan.  Denies hearing voices.  Patient states, "I haven't really been getting along with the people at my group home.  Patient is very sweaty and he states this is because he walked a long time today.

## 2015-09-24 NOTE — ED Notes (Signed)
Pt labs and urine collected and sent to lab.

## 2015-09-25 DIAGNOSIS — F250 Schizoaffective disorder, bipolar type: Secondary | ICD-10-CM

## 2015-09-25 DIAGNOSIS — F329 Major depressive disorder, single episode, unspecified: Secondary | ICD-10-CM | POA: Diagnosis not present

## 2015-09-25 DIAGNOSIS — I10 Essential (primary) hypertension: Secondary | ICD-10-CM

## 2015-09-25 LAB — PROTIME-INR
INR: 1.31
Prothrombin Time: 16.4 seconds — ABNORMAL HIGH (ref 11.4–15.0)

## 2015-09-25 MED ORDER — CLOZAPINE 100 MG PO TABS: 100.0000 mg | ORAL_TABLET | Freq: Two times a day (BID) | ORAL | Status: DC

## 2015-09-25 MED ORDER — TRAZODONE HCL 100 MG PO TABS: 100.0000 mg | ORAL_TABLET | Freq: Every day | ORAL | Status: DC

## 2015-09-25 MED ORDER — WARFARIN SODIUM 10 MG PO TABS
10.0000 mg | ORAL_TABLET | ORAL | Status: DC
Start: 2015-09-25 — End: 2015-09-25
  Filled 2015-09-25: qty 1

## 2015-09-25 MED ORDER — ASENAPINE MALEATE 5 MG SL SUBL
5.0000 mg | SUBLINGUAL_TABLET | Freq: Three times a day (TID) | SUBLINGUAL | Status: DC
  Administered 2015-09-25 (×2): 5 mg via SUBLINGUAL
  Filled 2015-09-25 (×2): qty 1

## 2015-09-25 MED ORDER — LITHIUM CARBONATE 300 MG PO CAPS: 900.0000 mg | ORAL_CAPSULE | Freq: Every day | ORAL | Status: DC

## 2015-09-25 MED ORDER — ZIPRASIDONE HCL 20 MG PO CAPS
80.0000 mg | ORAL_CAPSULE | Freq: Two times a day (BID) | ORAL | Status: DC
  Administered 2015-09-25: 80 mg via ORAL
  Filled 2015-09-25: qty 4

## 2015-09-25 MED ORDER — SIMVASTATIN 40 MG PO TABS: 40.0000 mg | ORAL_TABLET | Freq: Every day | ORAL | Status: DC

## 2015-09-25 MED ORDER — CLOZAPINE 100 MG PO TABS: 300.0000 mg | ORAL_TABLET | Freq: Every day | ORAL | Status: DC

## 2015-09-25 MED ORDER — WARFARIN SODIUM 5 MG PO TABS: 5.0000 mg | ORAL_TABLET | ORAL | Status: DC

## 2015-09-25 MED ORDER — LISINOPRIL 5 MG PO TABS
2.5000 mg | ORAL_TABLET | Freq: Every day | ORAL | Status: DC
  Administered 2015-09-25: 2.5 mg via ORAL
  Filled 2015-09-25: qty 1

## 2015-09-25 MED ORDER — PAROXETINE HCL ER 12.5 MG PO TB24
25.0000 mg | ORAL_TABLET | Freq: Every day | ORAL | Status: DC
  Filled 2015-09-25: qty 1

## 2015-09-25 MED ORDER — WARFARIN - PHYSICIAN DOSING INPATIENT
Freq: Every day | Status: DC
  Filled 2015-09-25: qty 1

## 2015-09-25 MED ORDER — METFORMIN HCL 500 MG PO TABS
ORAL_TABLET | ORAL | Status: AC
  Filled 2015-09-25: qty 2

## 2015-09-25 MED ORDER — MOMETASONE FURO-FORMOTEROL FUM 200-5 MCG/ACT IN AERO
2.0000 | INHALATION_SPRAY | Freq: Two times a day (BID) | RESPIRATORY_TRACT | Status: DC
  Filled 2015-09-25 (×2): qty 8.8

## 2015-09-25 MED ORDER — METFORMIN HCL 500 MG PO TABS
1000.0000 mg | ORAL_TABLET | Freq: Two times a day (BID) | ORAL | Status: DC
  Administered 2015-09-25: 1000 mg via ORAL

## 2015-09-25 MED ORDER — CLOZAPINE 100 MG PO TABS
100.0000 mg | ORAL_TABLET | Freq: Every day | ORAL | Status: DC
  Administered 2015-09-25: 100 mg via ORAL
  Filled 2015-09-25: qty 1

## 2015-09-25 NOTE — ED Notes (Signed)
Patient is pleasant, no behavioral issues, Patient denies Si/hi, q 15 min. Checks, and camera monitoring in progress.
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
## 2015-09-25 NOTE — ED Notes (Signed)
Patient with discharge instructions, voiced understanding of instructions, Patient's belongings given to Patient. Patient without any s/s of distress.

## 2015-09-25 NOTE — ED Notes (Signed)
Lab tech. In to draw Pt/INR, Patient tolerated without difficulty.

## 2015-09-25 NOTE — ED Notes (Signed)
Nurse served patient breakfast. Patient is oriented, No Si/HI, will continue to monitor. Patient with q 15 min. Checks and camera monitoring in progress.

## 2015-09-25 NOTE — ED Notes (Signed)
Patient dressed and His ride is here from group home.

## 2015-09-25 NOTE — ED Notes (Signed)
Patient received lunch tray, no evidence of distress, Patient is pleasant, and talking to nurse. Will continue to monitor.

## 2015-09-25 NOTE — ED Notes (Signed)

## 2015-09-25 NOTE — ED Notes (Signed)
Patient is taking a shower.

## 2015-09-25 NOTE — ED Provider Notes (Signed)
-----------------------------------------   4:13 PM on 09/25/2015 -----------------------------------------  Dr. Toni Amendlapacs of psychiatry has evaluated the patient and rescinded IVC. He recommends discharge home and the patient's ACT team will follow up with him.  Gayla DossEryka A Arin Vanosdol, MD 09/25/15 [phone]

## 2015-09-25 NOTE — Progress Notes (Signed)
MEDICATION RELATED CONSULT NOTE - INITIAL   Pharmacy Consult for Clozapine  Indication: schizophrenia  Allergies  Allergen Reactions  . Penicillins     Patient Measurements: Height: 5' 10.5" (179.1 cm) Weight: 210 lb (95.255 kg) IBW/kg (Calculated) : 74.15  Vital Signs: Temp: 97.9 F (36.6 C) (04/12 0700) Temp Source: Oral (04/12 0700) BP: 128/80 mmHg (04/12 0700) Pulse Rate: 80 (04/12 0700) Intake/Output from previous day:   Intake/Output from this shift:    Labs:  Recent Labs  09/24/15 1843  WBC 5.8  HGB 15.9  HCT 45.5  PLT 184  CREATININE 1.16  ALBUMIN 4.9  PROT 7.1  AST 24  ALT 25  ALKPHOS 57  BILITOT 0.6   Estimated Creatinine Clearance: 97.9 mL/min (by C-G formula based on Cr of 1.16).   Microbiology: No results found for this or any previous visit (from the past 720 hour(s)).  Medical History: Past Medical History  Diagnosis Date  . Diabetes mellitus without complication (HCC)   . Hypertension   . Hyperlipidemia   . Depression   . Schizo affective schizophrenia (HCC)     Medications:  Scheduled:  . asenapine  5 mg Sublingual TID  . cloZAPine  100 mg Oral Daily  . cloZAPine  300 mg Oral QHS  . lisinopril  2.5 mg Oral Daily  . lithium carbonate  900 mg Oral QHS  . metFORMIN      . metFORMIN  1,000 mg Oral BID WC  . mometasone-formoterol  2 puff Inhalation BID  . PARoxetine  25 mg Oral Daily  . simvastatin  40 mg Oral QHS  . traZODone  100 mg Oral QHS  . warfarin  10 mg Oral QODAY  . warfarin  5 mg Oral QODAY  . ziprasidone  80 mg Oral BID WC    Assessment: 40 yo male admitted to Behavioral Health Unit with depression and thoughts of suicide.  Has been maintained on Clozapine previously.    Goal of Therapy:  Control Symptoms of schizophrenia.  Plan:  Submitted data to Clozapine Regristry.  Patient eligible for dispensing.   Will continue weekly WBC counts and monitor.  Jermika Olden K 09/25/2015,9:09 AM

## 2015-10-01 ENCOUNTER — Ambulatory Visit: Payer: Medicare Other | Admitting: Sports Medicine

## 2015-10-14 DIAGNOSIS — Z79899 Other long term (current) drug therapy: Secondary | ICD-10-CM | POA: Diagnosis not present

## 2015-10-17 ENCOUNTER — Encounter: Payer: Self-pay | Admitting: Podiatry

## 2015-10-17 ENCOUNTER — Ambulatory Visit (INDEPENDENT_AMBULATORY_CARE_PROVIDER_SITE_OTHER): Payer: Medicare Other | Admitting: Podiatry

## 2015-10-17 DIAGNOSIS — E119 Type 2 diabetes mellitus without complications: Secondary | ICD-10-CM | POA: Diagnosis not present

## 2015-10-17 DIAGNOSIS — M79676 Pain in unspecified toe(s): Secondary | ICD-10-CM | POA: Diagnosis not present

## 2015-10-17 DIAGNOSIS — B351 Tinea unguium: Secondary | ICD-10-CM

## 2015-10-17 DIAGNOSIS — Q828 Other specified congenital malformations of skin: Secondary | ICD-10-CM

## 2015-10-17 NOTE — Progress Notes (Signed)
Patient ID: Lance [name], male   DOB: 01/20/1976, 40 y.o.   MRN: [id number]   Subjective: 40 y.o.-year-old male returns the office today for painful, elongated, thickened toenails which he cannot trim himself and for painful calluses. Denies any redness or drainage around the nails/calluses. Denies any acute changes since last appointment and no new complaints today. Denies any systemic complaints such as fevers, chills, nausea, vomiting.   Objective: AAO 3, NAD DP/PT pulses palpable, CRT less than 3 seconds Nails hypertrophic, dystrophic, elongated, brittle, discolored 10. There is tenderness overlying these nails. There is no surrounding erythema or drainage along the nail sites. Bilateral hallux toenails have been removed but there are spicule of nail present which are thick.  Bilateral medial hallux and right submet 4 hyperkerotic lesions. Upon debridement no open lesions, drainage, or other signs of infection.  No open lesions or other pre-ulcerative lesions are identified. No other areas of tenderness bilateral lower extremities. No overlying edema, erythema, increased warmth. No pain with calf compression, swelling, warmth, erythema.  Assessment: Patient presents with symptomatic onychomycosis; porokerotosis x 3  Plan: -Treatment options including alternatives, risks, complications were discussed -Nails sharply debrided 10 without complication/bleeding. -Hyperkerotic lesions sharply debrided x 3 without complications/bleeding.  -Discussed daily foot inspection. If there are any changes, to call the office immediately.  -Follow-up in 3 months or sooner if any problems are to arise. In the meantime, encouraged to call the office with any questions, concerns, changes symptoms.  Matthew Wagoner, DPM  

## 2015-11-14 DIAGNOSIS — Z79899 Other long term (current) drug therapy: Secondary | ICD-10-CM | POA: Diagnosis not present

## 2015-12-13 DIAGNOSIS — Z79899 Other long term (current) drug therapy: Secondary | ICD-10-CM | POA: Diagnosis not present

## 2016-01-21 ENCOUNTER — Encounter: Payer: Self-pay | Admitting: Podiatry

## 2016-01-21 ENCOUNTER — Ambulatory Visit (INDEPENDENT_AMBULATORY_CARE_PROVIDER_SITE_OTHER): Payer: Medicare Other | Admitting: Podiatry

## 2016-01-21 DIAGNOSIS — M79676 Pain in unspecified toe(s): Secondary | ICD-10-CM

## 2016-01-21 DIAGNOSIS — E119 Type 2 diabetes mellitus without complications: Secondary | ICD-10-CM

## 2016-01-21 DIAGNOSIS — B351 Tinea unguium: Secondary | ICD-10-CM

## 2016-01-21 DIAGNOSIS — Q828 Other specified congenital malformations of skin: Secondary | ICD-10-CM

## 2016-01-21 DIAGNOSIS — Z79899 Other long term (current) drug therapy: Secondary | ICD-10-CM | POA: Diagnosis not present

## 2016-01-21 NOTE — Progress Notes (Signed)
Patient ID: Lance [name], male  Lance [name] DOB: Jul 19, 1975, 40 y.o.   MRN: [id number]   Subjective: 40 y.o.-year-old male returns the office today for painful, elongated, thickened toenails which he cannot trim himself and for painful calluses. Denies any redness or drainage around the nails/calluses. Denies any acute changes since last appointment and no new complaints today. Denies any systemic complaints such as fevers, chills, nausea, vomiting.   Objective: AAO 3, NAD DP/PT pulses palpable, CRT less than 3 seconds Nails hypertrophic, dystrophic, elongated, brittle, discolored 10. There is tenderness overlying these nails. There is no surrounding erythema or drainage along the nail sites. Bilateral hallux toenails have been removed but there are spicule of nail present which are thick.  Bilateral medial hallux and right submet 4 hyperkerotic lesions. Upon debridement no open lesions, drainage, or other signs of infection.  No open lesions or other pre-ulcerative lesions are identified. No other areas of tenderness bilateral lower extremities. No overlying edema, erythema, increased warmth. No pain with calf compression, swelling, warmth, erythema.  Assessment: Patient presents with symptomatic onychomycosis; porokerotosis x 3  Plan: -Treatment options including alternatives, risks, complications were discussed -Nails sharply debrided 10 without complication/bleeding. -Hyperkerotic lesions sharply debrided x 3 without complications/bleeding.  -Discussed daily foot inspection. If there are any changes, to call the office immediately.  -Follow-up in 3 months or sooner if any problems are to arise. In the meantime, encouraged to call the office with any questions, concerns, changes symptoms.  Ovid CurdMatthew Morgana Rowley, DPM

## 2016-02-12 DIAGNOSIS — I2699 Other pulmonary embolism without acute cor pulmonale: Secondary | ICD-10-CM | POA: Diagnosis not present

## 2016-02-12 DIAGNOSIS — I2782 Chronic pulmonary embolism: Secondary | ICD-10-CM | POA: Diagnosis not present

## 2016-02-12 DIAGNOSIS — I10 Essential (primary) hypertension: Secondary | ICD-10-CM | POA: Diagnosis not present

## 2016-02-12 DIAGNOSIS — E119 Type 2 diabetes mellitus without complications: Secondary | ICD-10-CM | POA: Diagnosis not present

## 2016-03-10 DIAGNOSIS — Z79899 Other long term (current) drug therapy: Secondary | ICD-10-CM | POA: Diagnosis not present

## 2016-04-08 DIAGNOSIS — Z79899 Other long term (current) drug therapy: Secondary | ICD-10-CM | POA: Diagnosis not present

## 2016-04-09 ENCOUNTER — Encounter: Payer: Self-pay | Admitting: Emergency Medicine

## 2016-04-09 DIAGNOSIS — Y999 Unspecified external cause status: Secondary | ICD-10-CM | POA: Insufficient documentation

## 2016-04-09 DIAGNOSIS — Y92009 Unspecified place in unspecified non-institutional (private) residence as the place of occurrence of the external cause: Secondary | ICD-10-CM | POA: Diagnosis not present

## 2016-04-09 DIAGNOSIS — Y9389 Activity, other specified: Secondary | ICD-10-CM | POA: Insufficient documentation

## 2016-04-09 DIAGNOSIS — S00511A Abrasion of lip, initial encounter: Secondary | ICD-10-CM | POA: Diagnosis not present

## 2016-04-09 DIAGNOSIS — F17210 Nicotine dependence, cigarettes, uncomplicated: Secondary | ICD-10-CM | POA: Diagnosis not present

## 2016-04-09 DIAGNOSIS — Z7984 Long term (current) use of oral hypoglycemic drugs: Secondary | ICD-10-CM | POA: Diagnosis not present

## 2016-04-09 DIAGNOSIS — S098XXA Other specified injuries of head, initial encounter: Secondary | ICD-10-CM | POA: Diagnosis not present

## 2016-04-09 DIAGNOSIS — E119 Type 2 diabetes mellitus without complications: Secondary | ICD-10-CM | POA: Insufficient documentation

## 2016-04-09 DIAGNOSIS — I10 Essential (primary) hypertension: Secondary | ICD-10-CM | POA: Insufficient documentation

## 2016-04-09 DIAGNOSIS — S0993XA Unspecified injury of face, initial encounter: Secondary | ICD-10-CM | POA: Diagnosis present

## 2016-04-09 DIAGNOSIS — Z7901 Long term (current) use of anticoagulants: Secondary | ICD-10-CM | POA: Insufficient documentation

## 2016-04-09 DIAGNOSIS — S0091XA Abrasion of unspecified part of head, initial encounter: Secondary | ICD-10-CM | POA: Diagnosis not present
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
## 2016-04-10 ENCOUNTER — Emergency Department
Admission: EM | Admit: 2016-04-10 | Discharge: 2016-04-10 | Disposition: A | Discharge status: Home / Self Care | Payer: Medicare Other | Attending: Emergency Medicine | Admitting: Emergency Medicine

## 2016-04-10 DIAGNOSIS — S00511A Abrasion of lip, initial encounter: Secondary | ICD-10-CM
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
## 2016-04-10 NOTE — ED Notes (Signed)
Pt discharged to home.  Discharge instructions reviewed.  Verbalized understanding.  No questions or concerns at this time.  Teach back verified.  Pt in NAD.  No items left in ED.   

## 2016-04-23 ENCOUNTER — Ambulatory Visit (INDEPENDENT_AMBULATORY_CARE_PROVIDER_SITE_OTHER): Payer: Medicare Other | Admitting: Podiatry

## 2016-04-23 ENCOUNTER — Encounter: Payer: Self-pay | Admitting: Podiatry

## 2016-04-23 DIAGNOSIS — B351 Tinea unguium: Secondary | ICD-10-CM | POA: Diagnosis not present

## 2016-04-23 DIAGNOSIS — Q828 Other specified congenital malformations of skin: Secondary | ICD-10-CM | POA: Diagnosis not present

## 2016-04-23 DIAGNOSIS — E119 Type 2 diabetes mellitus without complications: Secondary | ICD-10-CM

## 2016-04-23 DIAGNOSIS — B353 Tinea pedis: Secondary | ICD-10-CM | POA: Diagnosis not present

## 2016-04-23 DIAGNOSIS — M2041 Other hammer toe(s) (acquired), right foot: Secondary | ICD-10-CM | POA: Diagnosis not present

## 2016-04-23 DIAGNOSIS — M79676 Pain in unspecified toe(s): Secondary | ICD-10-CM

## 2016-04-23 DIAGNOSIS — M2042 Other hammer toe(s) (acquired), left foot: Secondary | ICD-10-CM

## 2016-04-23 MED ORDER — KETOCONAZOLE 2 % EX CREA
1.0000 | TOPICAL_CREAM | Freq: Every day | CUTANEOUS | 2 refills | Status: DC
Start: 2016-04-23 — End: 2016-09-01

## 2016-04-23 NOTE — Progress Notes (Signed)
Patient ID: Rhodia AlbrightJames P Keel, male   DOB: Aug 10, 1975, 40 y.o.   MRN: [id number]   Subjective: 40 y.o.-year-old male returns the office today for painful, elongated, thickened toenails which he cannot trim himself and for painful calluses. Denies any redness or drainage around the nails/calluses. He is requesting diabetic shoes. Denies any acute changes since last appointment and no new complaints today. Denies any systemic complaints such as fevers, chills, nausea, vomiting.   Objective: AAO 3, NAD DP/PT pulses palpable, CRT less than 3 seconds Sensation intact with Simms Weinstein monofilament Nails hypertrophic, dystrophic, elongated, brittle, discolored 10. There is tenderness overlying these nails. There is no surrounding erythema or drainage along the nail sites. Bilateral hallux toenails have been removed but there are spicule of nail present which are thick.  Bilateral medial hallux and right submet 4 hyperkerotic lesions. Upon debridement no open lesions, drainage, or other signs of infection.  Dry, peeling, xerotic skin on the plantar as of the foot as well as interdigitally consistent with tinea pedis. No open lesions or other pre-ulcerative lesions are identified. No other areas of tenderness bilateral lower extremities. No overlying edema, erythema, increased warmth. No pain with calf compression, swelling, warmth, erythema.  Assessment: Patient presents with symptomatic onychomycosis; porokerotosis x 3  Plan: -Treatment options including alternatives, risks, complications were discussed -Nails sharply debrided 10 without complication/bleeding. -Hyperkerotic lesions sharply debrided x 3 without complications/bleeding.  -Ketoconazole -Prescription for diabetic shoes given. -Discussed daily foot inspection. If there are any changes, to call the office immediately.  -Follow-up in 3 months or sooner if any problems are to arise. In the meantime, encouraged to call the office with any  questions, concerns, changes symptoms.  Ovid CurdMatthew Inari Shin, DPM

## 2016-05-18 DIAGNOSIS — Z79899 Other long term (current) drug therapy: Secondary | ICD-10-CM | POA: Diagnosis not present

## 2016-05-21 ENCOUNTER — Encounter: Payer: Self-pay | Admitting: Emergency Medicine

## 2016-05-21 ENCOUNTER — Emergency Department: Payer: Medicare Other

## 2016-05-21 ENCOUNTER — Emergency Department
Admission: EM | Admit: 2016-05-21 | Discharge: 2016-05-21 | Disposition: A | Discharge status: Other Acute Inpatient Hospital | Payer: Medicare Other | Attending: Student in an Organized Health Care Education/Training Program | Admitting: Student in an Organized Health Care Education/Training Program

## 2016-05-21 ENCOUNTER — Encounter: Payer: Self-pay | Admitting: Behavioral Health

## 2016-05-21 ENCOUNTER — Other Ambulatory Visit: Payer: Self-pay

## 2016-05-21 ENCOUNTER — Inpatient Hospital Stay
Admission: RE | Admit: 2016-05-21 | Discharge: 2016-05-26 | DRG: 885 | Disposition: A | Discharge status: Home / Self Care | Payer: Medicare Other | Source: Intra-hospital | Attending: Psychiatry | Admitting: Psychiatry

## 2016-05-21 DIAGNOSIS — Z7984 Long term (current) use of oral hypoglycemic drugs: Secondary | ICD-10-CM | POA: Diagnosis not present

## 2016-05-21 DIAGNOSIS — R4587 Impulsiveness: Secondary | ICD-10-CM | POA: Diagnosis present

## 2016-05-21 DIAGNOSIS — F259 Schizoaffective disorder, unspecified: Secondary | ICD-10-CM

## 2016-05-21 DIAGNOSIS — Z794 Long term (current) use of insulin: Secondary | ICD-10-CM | POA: Diagnosis not present

## 2016-05-21 DIAGNOSIS — G4700 Insomnia, unspecified: Secondary | ICD-10-CM | POA: Diagnosis present

## 2016-05-21 DIAGNOSIS — I10 Essential (primary) hypertension: Secondary | ICD-10-CM | POA: Diagnosis present

## 2016-05-21 DIAGNOSIS — Z86711 Personal history of pulmonary embolism: Secondary | ICD-10-CM | POA: Diagnosis present

## 2016-05-21 DIAGNOSIS — Z811 Family history of alcohol abuse and dependence: Secondary | ICD-10-CM | POA: Diagnosis not present

## 2016-05-21 DIAGNOSIS — E119 Type 2 diabetes mellitus without complications: Secondary | ICD-10-CM

## 2016-05-21 DIAGNOSIS — R197 Diarrhea, unspecified: Secondary | ICD-10-CM | POA: Diagnosis not present

## 2016-05-21 DIAGNOSIS — E785 Hyperlipidemia, unspecified: Secondary | ICD-10-CM | POA: Diagnosis present

## 2016-05-21 DIAGNOSIS — F250 Schizoaffective disorder, bipolar type: Secondary | ICD-10-CM | POA: Diagnosis not present

## 2016-05-21 DIAGNOSIS — R1013 Epigastric pain: Secondary | ICD-10-CM | POA: Diagnosis not present

## 2016-05-21 DIAGNOSIS — F419 Anxiety disorder, unspecified: Secondary | ICD-10-CM | POA: Diagnosis present

## 2016-05-21 DIAGNOSIS — F209 Schizophrenia, unspecified: Secondary | ICD-10-CM | POA: Diagnosis not present

## 2016-05-21 DIAGNOSIS — Z7901 Long term (current) use of anticoagulants: Secondary | ICD-10-CM | POA: Diagnosis not present

## 2016-05-21 DIAGNOSIS — F22 Delusional disorders: Secondary | ICD-10-CM | POA: Diagnosis present

## 2016-05-21 DIAGNOSIS — F319 Bipolar disorder, unspecified: Secondary | ICD-10-CM

## 2016-05-21 DIAGNOSIS — R45851 Suicidal ideations: Secondary | ICD-10-CM | POA: Diagnosis present

## 2016-05-21 DIAGNOSIS — Z915 Personal history of self-harm: Secondary | ICD-10-CM | POA: Diagnosis not present

## 2016-05-21 DIAGNOSIS — R112 Nausea with vomiting, unspecified: Secondary | ICD-10-CM | POA: Diagnosis not present

## 2016-05-21 DIAGNOSIS — Z79899 Other long term (current) drug therapy: Secondary | ICD-10-CM | POA: Diagnosis not present

## 2016-05-21 DIAGNOSIS — F17210 Nicotine dependence, cigarettes, uncomplicated: Secondary | ICD-10-CM | POA: Diagnosis present

## 2016-05-21 DIAGNOSIS — Z23 Encounter for immunization: Secondary | ICD-10-CM | POA: Diagnosis not present

## 2016-05-21 LAB — CBC
HEMATOCRIT: 46.9 % (ref 40.0–52.0)
Hemoglobin: 16.3 g/dL (ref 13.0–18.0)
MCH: 31.8 pg (ref 26.0–34.0)
MCHC: 34.8 g/dL (ref 32.0–36.0)
MCV: 91.5 fL (ref 80.0–100.0)
PLATELETS: 274 10*3/uL (ref 150–440)
RBC: 5.13 MIL/uL (ref 4.40–5.90)
RDW: 12.8 % (ref 11.5–14.5)
WBC: 9.1 10*3/uL (ref 3.8–10.6)

## 2016-05-21 LAB — PROTIME-INR
INR: 1.26
Prothrombin Time: 15.9 seconds — ABNORMAL HIGH (ref 11.4–15.2)

## 2016-05-21 LAB — COMPREHENSIVE METABOLIC PANEL
ALK PHOS: 55 U/L (ref 38–126)
ALT: 24 U/L (ref 17–63)
ANION GAP: 13 (ref 5–15)
AST: 25 U/L (ref 15–41)
Albumin: 5.2 g/dL — ABNORMAL HIGH (ref 3.5–5.0)
BUN: 19 mg/dL (ref 6–20)
CALCIUM: 9.9 mg/dL (ref 8.9–10.3)
CHLORIDE: 106 mmol/L (ref 101–111)
CO2: 22 mmol/L (ref 22–32)
CREATININE: 0.98 mg/dL (ref 0.61–1.24)
GFR calc Af Amer: >60 mL/min (ref >60)
GFR calc non Af Amer: >60 mL/min (ref >60)
GLUCOSE: 109 mg/dL — AB (ref 65–99)
Potassium: 3.9 mmol/L (ref 3.5–5.1)
SODIUM: 141 mmol/L (ref 135–145)
Total Bilirubin: 1.1 mg/dL (ref 0.3–1.2)
Total Protein: 7.8 g/dL (ref 6.5–8.1)

## 2016-05-21 LAB — DIFFERENTIAL
Basophils Absolute: 0 10*3/uL (ref 0–0.1)
Basophils Relative: 0 %
EOS PCT: 0 %
Eosinophils Absolute: 0 10*3/uL (ref 0–0.7)
LYMPHS ABS: 1.7 10*3/uL (ref 1.0–3.6)
LYMPHS PCT: 19 %
Monocytes Absolute: 0.8 10*3/uL (ref 0.2–1.0)
Monocytes Relative: 9 %
NEUTROS ABS: 6.3 10*3/uL (ref 1.4–6.5)
NEUTROS PCT: 72 %

## 2016-05-21 LAB — LITHIUM LEVEL
LITHIUM LVL: 0.74 mmol/L (ref 0.60–1.20)
Lithium Lvl: 0.65 mmol/L (ref 0.60–1.20)

## 2016-05-21 LAB — SALICYLATE LEVEL: Salicylate Lvl: <7 mg/dL (ref 2.8–30.0)

## 2016-05-21 LAB — LIPASE, BLOOD: Lipase: 22 U/L (ref 11–51)

## 2016-05-21 LAB — ACETAMINOPHEN LEVEL: Acetaminophen (Tylenol), Serum: <10 ug/mL — ABNORMAL LOW (ref 10–30)

## 2016-05-21 LAB — GLUCOSE, CAPILLARY: Glucose-Capillary: 163 mg/dL — ABNORMAL HIGH (ref 65–99)

## 2016-05-21 LAB — ETHANOL: Alcohol, Ethyl (B): <5 mg/dL (ref <5)

## 2016-05-21 MED ORDER — WARFARIN SODIUM 7.5 MG PO TABS
7.5000 mg | ORAL_TABLET | Freq: Every day | ORAL | Status: DC
  Administered 2016-05-21 – 2016-05-25 (×5): 7.5 mg via ORAL
  Filled 2016-05-21 (×6): qty 1

## 2016-05-21 MED ORDER — PAROXETINE HCL ER 25 MG PO TB24
25.0000 mg | ORAL_TABLET | Freq: Every day | ORAL | Status: DC
  Filled 2016-05-21: qty 1

## 2016-05-21 MED ORDER — WARFARIN - PHYSICIAN DOSING INPATIENT
Freq: Every day | Status: DC
  Filled 2016-05-21 (×6): qty 1

## 2016-05-21 MED ORDER — METFORMIN HCL 500 MG PO TABS: 1000.0000 mg | ORAL_TABLET | Freq: Two times a day (BID) | ORAL | Status: DC

## 2016-05-21 MED ORDER — MOMETASONE FURO-FORMOTEROL FUM 200-5 MCG/ACT IN AERO
2.0000 | INHALATION_SPRAY | Freq: Two times a day (BID) | RESPIRATORY_TRACT | Status: DC
  Administered 2016-05-22 – 2016-05-25 (×7): 2 via RESPIRATORY_TRACT
  Filled 2016-05-21: qty 8.8

## 2016-05-21 MED ORDER — SODIUM CHLORIDE 0.9 % IV BOLUS (SEPSIS)
1000.0000 mL | Freq: Once | INTRAVENOUS | Status: AC
  Administered 2016-05-21: 1000 mL via INTRAVENOUS

## 2016-05-21 MED ORDER — PROMETHAZINE HCL 25 MG/ML IJ SOLN
25.0000 mg | Freq: Once | INTRAMUSCULAR | Status: AC
  Administered 2016-05-21: 25 mg via INTRAMUSCULAR
  Filled 2016-05-21: qty 1

## 2016-05-21 MED ORDER — CLOZAPINE 25 MG PO TABS: 100.0000 mg | ORAL_TABLET | Freq: Every day | ORAL | Status: DC

## 2016-05-21 MED ORDER — METOCLOPRAMIDE HCL 5 MG/ML IJ SOLN
10.0000 mg | Freq: Once | INTRAMUSCULAR | Status: AC
  Administered 2016-05-21: 10 mg via INTRAVENOUS
  Filled 2016-05-21: qty 2

## 2016-05-21 MED ORDER — CLOZAPINE 100 MG PO TABS
300.0000 mg | ORAL_TABLET | Freq: Every day | ORAL | Status: DC
  Administered 2016-05-21 – 2016-05-24 (×4): 300 mg via ORAL
  Filled 2016-05-21 (×4): qty 3

## 2016-05-21 MED ORDER — ONDANSETRON 8 MG PO TBDP
8.0000 mg | ORAL_TABLET | Freq: Three times a day (TID) | ORAL | Status: DC | PRN
  Filled 2016-05-21: qty 1

## 2016-05-21 MED ORDER — CLOZAPINE 100 MG PO TABS
300.0000 mg | ORAL_TABLET | Freq: Every day | ORAL | Status: DC
  Filled 2016-05-21: qty 3

## 2016-05-21 MED ORDER — PROMETHAZINE HCL 25 MG PO TABS: 25.0000 mg | ORAL_TABLET | Freq: Four times a day (QID) | ORAL | Status: DC | PRN

## 2016-05-21 MED ORDER — TRAZODONE HCL 100 MG PO TABS: 100.0000 mg | ORAL_TABLET | Freq: Every day | ORAL | Status: DC

## 2016-05-21 MED ORDER — LITHIUM CARBONATE 300 MG PO CAPS: 900.0000 mg | ORAL_CAPSULE | Freq: Every day | ORAL | Status: DC

## 2016-05-21 MED ORDER — ASENAPINE MALEATE 5 MG SL SUBL
5.0000 mg | SUBLINGUAL_TABLET | Freq: Three times a day (TID) | SUBLINGUAL | Status: DC
  Administered 2016-05-21: 5 mg via SUBLINGUAL
  Filled 2016-05-21: qty 1

## 2016-05-21 MED ORDER — WARFARIN SODIUM 7.5 MG PO TABS
7.5000 mg | ORAL_TABLET | Freq: Every day | ORAL | Status: DC
  Filled 2016-05-21: qty 1

## 2016-05-21 MED ORDER — TRAZODONE HCL 100 MG PO TABS: 100.0000 mg | ORAL_TABLET | Freq: Every evening | ORAL | Status: DC | PRN

## 2016-05-21 MED ORDER — HYDROXYZINE HCL 25 MG PO TABS: 25.0000 mg | ORAL_TABLET | Freq: Three times a day (TID) | ORAL | Status: DC | PRN

## 2016-05-21 MED ORDER — LISINOPRIL 5 MG PO TABS
2.5000 mg | ORAL_TABLET | Freq: Every day | ORAL | Status: DC
  Administered 2016-05-21: 2.5 mg via ORAL
  Filled 2016-05-21: qty 1

## 2016-05-21 MED ORDER — METFORMIN HCL 500 MG PO TABS
1000.0000 mg | ORAL_TABLET | Freq: Two times a day (BID) | ORAL | Status: DC
  Administered 2016-05-22 – 2016-05-26 (×9): 1000 mg via ORAL
  Filled 2016-05-21 (×10): qty 2

## 2016-05-21 MED ORDER — CLOZAPINE 100 MG PO TABS
100.0000 mg | ORAL_TABLET | Freq: Every day | ORAL | Status: DC
  Administered 2016-05-22 – 2016-05-26 (×5): 100 mg via ORAL
  Filled 2016-05-21 (×5): qty 1

## 2016-05-21 MED ORDER — PAROXETINE HCL ER 25 MG PO TB24
25.0000 mg | ORAL_TABLET | Freq: Every day | ORAL | Status: DC
  Administered 2016-05-22 – 2016-05-26 (×5): 25 mg via ORAL
  Filled 2016-05-21 (×9): qty 1

## 2016-05-21 MED ORDER — ACETAMINOPHEN 325 MG PO TABS
650.0000 mg | ORAL_TABLET | Freq: Four times a day (QID) | ORAL | Status: DC | PRN
  Administered 2016-05-22 – 2016-05-26 (×8): 650 mg via ORAL
  Filled 2016-05-21 (×7): qty 2

## 2016-05-21 MED ORDER — WARFARIN - PHYSICIAN DOSING INPATIENT: Freq: Every day | Status: DC

## 2016-05-21 MED ORDER — LISINOPRIL 2.5 MG PO TABS
2.5000 mg | ORAL_TABLET | Freq: Every day | ORAL | Status: DC
  Administered 2016-05-22: 2.5 mg via ORAL
  Filled 2016-05-21: qty 1

## 2016-05-21 MED ORDER — TRAZODONE HCL 100 MG PO TABS
100.0000 mg | ORAL_TABLET | Freq: Every day | ORAL | Status: DC
  Administered 2016-05-21 – 2016-05-24 (×4): 100 mg via ORAL
  Filled 2016-05-21 (×5): qty 1

## 2016-05-21 MED ORDER — MOMETASONE FURO-FORMOTEROL FUM 200-5 MCG/ACT IN AERO
2.0000 | INHALATION_SPRAY | Freq: Two times a day (BID) | RESPIRATORY_TRACT | Status: DC
  Filled 2016-05-21: qty 8.8

## 2016-05-21 MED ORDER — INFLUENZA VAC SPLIT QUAD 0.5 ML IM SUSY
0.5000 mL | PREFILLED_SYRINGE | INTRAMUSCULAR | Status: AC
  Administered 2016-05-22: 0.5 mL via INTRAMUSCULAR
  Filled 2016-05-21: qty 0.5

## 2016-05-21 MED ORDER — ONDANSETRON 4 MG PO TBDP
ORAL_TABLET | ORAL | Status: AC
  Filled 2016-05-21: qty 1

## 2016-05-21 MED ORDER — ASENAPINE MALEATE 5 MG SL SUBL
5.0000 mg | SUBLINGUAL_TABLET | Freq: Three times a day (TID) | SUBLINGUAL | Status: DC
  Filled 2016-05-21: qty 1

## 2016-05-21 MED ORDER — LITHIUM CARBONATE 300 MG PO CAPS
900.0000 mg | ORAL_CAPSULE | Freq: Every day | ORAL | Status: DC
  Administered 2016-05-21: 900 mg via ORAL
  Filled 2016-05-21: qty 3

## 2016-05-21 MED ORDER — PROMETHAZINE HCL 25 MG PO TABS
25.0000 mg | ORAL_TABLET | Freq: Four times a day (QID) | ORAL | Status: DC | PRN
  Administered 2016-05-21 – 2016-05-23 (×2): 25 mg via ORAL
  Filled 2016-05-21 (×2): qty 1

## 2016-05-21 MED ORDER — SIMVASTATIN 20 MG PO TABS
40.0000 mg | ORAL_TABLET | Freq: Every day | ORAL | Status: DC
  Filled 2016-05-21: qty 2

## 2016-05-21 MED ORDER — HALOPERIDOL LACTATE 5 MG/ML IJ SOLN
5.0000 mg | Freq: Once | INTRAMUSCULAR | Status: AC
  Administered 2016-05-21: 5 mg via INTRAMUSCULAR
  Filled 2016-05-21: qty 1

## 2016-05-21 MED ORDER — SIMVASTATIN 40 MG PO TABS
40.0000 mg | ORAL_TABLET | Freq: Every day | ORAL | Status: DC
  Administered 2016-05-22 – 2016-05-25 (×4): 40 mg via ORAL
  Filled 2016-05-21: qty 2
  Filled 2016-05-21 (×4): qty 1

## 2016-05-21 MED ORDER — ALUM & MAG HYDROXIDE-SIMETH 200-200-20 MG/5ML PO SUSP: 30.0000 mL | ORAL | Status: DC | PRN

## 2016-05-21 MED ORDER — ONDANSETRON 4 MG PO TBDP
4.0000 mg | ORAL_TABLET | Freq: Once | ORAL | Status: AC | PRN
  Administered 2016-05-21: 4 mg via ORAL

## 2016-05-21 MED ORDER — PNEUMOCOCCAL VAC POLYVALENT 25 MCG/0.5ML IJ INJ
0.5000 mL | INJECTION | INTRAMUSCULAR | Status: AC
  Administered 2016-05-22: 0.5 mL via INTRAMUSCULAR
  Filled 2016-05-21: qty 0.5

## 2016-05-21 MED ORDER — MAGNESIUM HYDROXIDE 400 MG/5ML PO SUSP: 30.0000 mL | Freq: Every day | ORAL | Status: DC | PRN

## 2016-05-21 NOTE — [facility] Group Notes (Signed)
[facility] Group Notes:  (Nursing/MHT/Case Management/Adjunct)  Date:  05/21/2016  Time:  11:20 PM  Type of Therapy:  Psychoeducational Skills  Participation Level:  Did Not Attend  Participation QualitySummary of Progress/Problems:  Lance NeerJackie L Brytney [name] 05/21/2016, 11:20 PM

## 2016-05-21 NOTE — BH Assessment (Signed)
Assessment Note  Rhodia AlbrightJames P Jantz is an 40 y.o. male who presents to ER due to hearing voices and having thoughts of ending his life. Patient states he is having the thought of overdosing on medications. He reports of having multiple hospitalizations due to his mental illness. He have diagnosis of schizophrenia. Also states, when discharged from inpatient units he does not follow up without patient treatment.  During the interview, the patient was calm cooperative and pleasant. He have no history of violence or aggression. He also reports of having no involvement with the legal system nor with DSS.  Diagnosis: Schizophrenia   Past Medical History:  Past Medical History:  Diagnosis Date  . Depression   . Diabetes mellitus without complication (HCC)   . Hyperlipidemia   . Hypertension   . Schizo affective schizophrenia (HCC)     History reviewed. No pertinent surgical history.  Family History: History reviewed. No pertinent family history.  Social History:  reports that he has been smoking Cigarettes.  He has been smoking about 1.00 pack per day. He has never used smokeless tobacco. He reports that he does not drink alcohol or use drugs.  Additional Social History:  Alcohol / Drug Use Pain Medications: See PTA Prescriptions: See PTA Over the Counter: See PTA History of alcohol / drug use?: No history of alcohol / drug abuse Longest period of sobriety (when/how long): See PTA Negative Consequences of Use:  (See PTA) Withdrawal Symptoms:  (n/a)  CIWA: CIWA-Ar BP: 121/67 Pulse Rate: 68 COWS:    Allergies:  Allergies  Allergen Reactions  . Penicillins     Home Medications:  (Not in a hospital admission)  OB/GYN Status:  No LMP for male patient.  General Assessment Data Location of Assessment: [facility] ED TTS Assessment: In system Is this a Tele or Face-to-Face Assessment?: Face-to-Face Is this an Initial Assessment or a Re-assessment for this encounter?: Initial  Assessment Marital status: Single Maiden name: n/a Is patient pregnant?: No Pregnancy Status: No Living Arrangements: Other (Comment) ([facility]) Can pt return to current living arrangement?: Yes Admission Status: Involuntary Is patient capable of signing voluntary admission?: No Referral Source: Self/Family/Friend Insurance type: n/a  Medical Screening Exam [facility] Walk-in ONLY) Medical Exam completed: Yes  Crisis Care Plan Living Arrangements: Other (Comment) ([facility]) Legal Guardian: Other: (None) Name of Psychiatrist: Reports of none Name of Therapist: Reports of none  Education Status Is patient currently in school?: No Current Grade: n/a Highest grade of school patient has completed: n/a Name of school: n/a Contact person: n/a  Risk to self with the past 6 months Suicidal Ideation: Yes-Currently Present Has patient been a risk to self within the past 6 months prior to admission? : Yes Suicidal Intent: No Has patient had any suicidal intent within the past 6 months prior to admission? : No Is patient at risk for suicide?: No Suicidal Plan?: Yes-Currently Present Has patient had any suicidal plan within the past 6 months prior to admission? : Yes Specify Current Suicidal Plan: overdose on medications Access to Means: Yes Specify Access to Suicidal Means: Can get medications What has been your use of drugs/alcohol within the last 12 months?: Reports of none Previous Attempts/Gestures: Yes How many times?: 1 Other Self Harm Risks: Reports of none Triggers for Past Attempts: None known Intentional Self Injurious Behavior: None Family Suicide History: No Recent stressful life event(s): Other (Comment) (Psychosis) Persecutory voices/beliefs?: No Depression: Yes Depression Symptoms: Feeling worthless/self pity, Loss of interest in usual  pleasures, Guilt, Fatigue, Isolating, Tearfulness Substance abuse history and/or treatment for substance abuse?:  No Suicide prevention information given to non-admitted patients: Not applicable  Risk to Others within the past 6 months Homicidal Ideation: No Does patient have any lifetime risk of violence toward others beyond the six months prior to admission? : No Thoughts of Harm to Others: No Current Homicidal Intent: No Current Homicidal Plan: No Access to Homicidal Means: No Identified Victim: Reports of none History of harm to others?: No Assessment of Violence: None Noted Violent Behavior Description: Reports of none Does patient have access to weapons?: No Criminal Charges Pending?: No Does patient have a court date: No Is patient on probation?: No  Psychosis Hallucinations: Auditory Delusions: Unspecified (Patient states "they religious.")  Mental Status Report Appearance/Hygiene: Unremarkable, In scrubs Eye Contact: Good Motor Activity: Freedom of movement, Unremarkable Speech: Logical/coherent Level of Consciousness: Alert Mood: Depressed, Anxious, Helpless, Sad, Pleasant Affect: Anxious, Appropriate to circumstance, Depressed, Sad Anxiety Level: Minimal Thought Processes: Coherent, Relevant Judgement: Partial Orientation: Person, Place, Time, Situation, Appropriate for developmental age Obsessive Compulsive Thoughts/Behaviors: Minimal  Cognitive Functioning Concentration: Normal Memory: Recent Intact, Remote Intact IQ: Average Insight: Fair Impulse Control: Poor Appetite: Fair Weight Loss: 0 Weight Gain: 0 Sleep: Decreased Total Hours of Sleep: 4 Vegetative Symptoms: None  ADLScreening [facility] Assessment Services) Patient's cognitive ability adequate to safely complete daily activities?: Yes Patient able to express need for assistance with ADLs?: Yes Independently performs ADLs?: Yes (appropriate for developmental age)  Prior Inpatient Therapy Prior Inpatient Therapy: Yes Prior Therapy Dates: Multiple Hospitalizations Prior Therapy Facilty/Provider(s): [facility], [facility] Reason for Treatment: Schizophrenia   Prior Outpatient Therapy Prior Outpatient Therapy: No Prior Therapy Dates: Reports of none Prior Therapy Facilty/Provider(s): Reports of none Reason for Treatment: Reports of none Does patient have an ACCT team?: No Does patient have Intensive In-House Services?  : No Does patient have [facility] services? : No Does patient have P4CC services?: No  ADL Screening (condition at time of admission) Patient's cognitive ability adequate to safely complete daily activities?: Yes Is the patient deaf or have difficulty hearing?: No Does the patient have difficulty seeing, even when wearing glasses/contacts?: No Does the patient have difficulty concentrating, remembering, or making decisions?: No Patient able to express need for assistance with ADLs?: Yes Does the patient have difficulty dressing or bathing?: No Independently performs ADLs?: Yes (appropriate for developmental age) Does the patient have difficulty walking or climbing stairs?: No Weakness of Legs: None Weakness of Arms/Hands: None  Home Assistive Devices/Equipment Home Assistive Devices/Equipment: None  Therapy Consults (therapy consults require a physician order) PT Evaluation Needed: No OT Evalulation Needed: No SLP Evaluation Needed: No Abuse/Neglect Assessment (Assessment to be complete while patient is alone) Physical Abuse: Denies Verbal Abuse: Denies Sexual Abuse: Denies Exploitation of patient/patient's resources: Denies Self-Neglect: Denies Values / Beliefs Cultural Requests During Hospitalization: None Spiritual Requests During Hospitalization: None Consults Spiritual Care Consult Needed: No Social Work Consult Needed: No Merchant navy officer (For Healthcare) Does Patient Have a Medical Advance Directive?: No    Additional Information 1:1 In Past 12 Months?: No CIRT Risk: No Elopement Risk: No Does patient have medical  clearance?: Yes  Child/Adolescent Assessment Running Away Risk: Denies (Patient )  Disposition:  Disposition Initial Assessment Completed for this Encounter: Yes Disposition of Patient: Other dispositions (ER MD ordered Psych Consult)  On Site Evaluation by:   Reviewed with Physician:    Lilyan Gilford MS, [name], LPC, NCC, CCSI  Therapeutic Triage Specialist 05/21/2016 7:46 PM

## 2016-05-21 NOTE — Progress Notes (Signed)
MEDICATION RELATED CONSULT NOTE - INITIAL   Pharmacy Consult for Clozaril monitoring  Indication: Clozaril (ANC, WBC)   Allergies  Allergen Reactions  . Penicillins     Patient Measurements:   Adjusted Body Weight:   Vital Signs: Temp: 98.1 F (36.7 C) (12/07 2055) Temp Source: Oral (12/07 2055) BP: 128/61 (12/07 2055) Pulse Rate: 56 (12/07 2055) Intake/Output from previous day: No intake/output data recorded. Intake/Output from this shift: No intake/output data recorded.  Labs:  Recent Labs  05/21/16 1247  WBC 9.1  HGB 16.3  HCT 46.9  PLT 274  CREATININE 0.98  ALBUMIN 5.2*  PROT 7.8  AST 25  ALT 24  ALKPHOS 55  BILITOT 1.1   Estimated Creatinine Clearance: 109.2 mL/min (by C-G formula based on SCr of 0.98 mg/dL).   Microbiology: No results found for this or any previous visit (from the past 720 hour(s)).  Medical History: Past Medical History:  Diagnosis Date  . Depression   . Diabetes mellitus without complication (HCC)   . Hyperlipidemia   . Hypertension   . Schizo affective schizophrenia (HCC)     Medications:  Scheduled:   Assessment: Pt on Clozaril every 4 weeks at home.   Goal of Therapy:    Plan:  Clozaril monitoring completed online .   Shonette Rhames D 05/21/2016,8:57 PM

## 2016-05-21 NOTE — ED Notes (Signed)
IVC/Consult completed/ Pending possible Admit to [facility] BMU  

## 2016-05-21 NOTE — ED Triage Notes (Signed)
Pt to ED c/o n/v/d since last night.  States vomited 20 times and diarrhea 5 times.  Pt also states hx of schizophrenia and "i feel like it's acting up".  States seeing angles and demons, reports SI with plan to cut wrists and states cut arms in past.

## 2016-05-21 NOTE — BH Assessment (Signed)
[facility] Dr. Toni Amendlapacs.  Attending Physician will be Dr. Ardyth HarpsHernandez.   Patient has been assigned to room [facility]B, [facility] Nurse LatimerPhyllis .   Intake Paper Work has been signed and placed on patient chart.  ER staff is aware of the admission Delaney Meigs(Tamara, ER Sect.; Dr. Warren LacyVeronse, ER MD; Annette StableBill, Patient's Nurse & Leotis ShamesLauren, Patient Access).

## 2016-05-21 NOTE — Tx Team (Signed)
Initial Treatment Plan 05/21/2016 11:17 PM Lance [name] [id number]    PATIENT STRESSORS: Health problems Traumatic event   PATIENT STRENGTHS: Ability for insight Active sense of humor Average or above average intelligence Communication skills Motivation for treatment/growth   PATIENT IDENTIFIED PROBLEMS: Suicidal Ideations  Depression  Anxiety  Diabetes  Spiritual Crisis             DISCHARGE CRITERIA:  Ability to meet basic life and health needs Adequate post-discharge living arrangements Improved stabilization in mood, thinking, and/or behavior Medical problems require only outpatient monitoring Motivation to continue treatment in a less acute level of care Need for constant or close observation no longer present Reduction of life-threatening or endangering symptoms to within safe limits Verbal commitment to aftercare and medication compliance  PRELIMINARY DISCHARGE PLAN: Outpatient therapy Placement in alternative living arrangements  PATIENT/FAMILY INVOLVEMENT: This treatment plan has been presented to and reviewed with the patient, Lance [name], and/or family member.  The patient and family have been given the opportunity to ask questions and make suggestions.  Barbette MerinoJENSEN, [name] Shari Prowsvan, RN 05/21/2016, 11:17 PM

## 2016-05-21 NOTE — Progress Notes (Addendum)
40 year old male admitted with suicidal ideation N/V/D. ED RN reported nausea that had resolved but pt continues to vomit and is unable to hold down fluids or  Medications. Vomit is thick consistency with a greenish tinge. Pt states that he is feeling hopeless due to the group home environment he currently lives in. Pt reports that another resident is bullying him and he feels overwhealmed and stressed out. Pt endorses passive SI but does contract for safety at this time. Writer encouraged pt to coordinate care with Child psychotherapistsocial worker as well. Consults ordered for social work and Orthoptistchaplain.Writer oriented pt to the BMU. Pt showered and and ate a snack prior to medication administration. However, he was unable to keep these down and threw up again. Writer contacted MD on call. Pt is alert and oriented and cooperative, but unable to rest in bed without becoming nauseous. He is walking around on the unit. New orders entered for NV. Will continue to monitor. Ginger ale given which he is holding down so far. Pt states that he has not had diarrhea since yesterday.

## 2016-05-21 NOTE — ED Notes (Signed)
IVC/Consult completed/ [facility]

## 2016-05-21 NOTE — Consult Note (Signed)
[place] Psychiatry Consult   Reason for Consult:  Consult for 40 year old man with a history of schizoaffective disorder who presents to the hospital with complaints of worsening psychosis and mood symptoms Referring Physician:  Quentin Cornwall Patient Identification: Lance [name] MRN:  [id number] Principal Diagnosis: Schizo affective schizophrenia [facility]) Diagnosis:   Patient Active Problem List   Diagnosis Date Noted  . Diabetes mellitus without complication ([place]) [H85.2] 05/21/2016  . Hyperlipidemia [E78.5] 05/21/2016  . History of pulmonary embolism [Z86.711] 05/21/2016  . Chronic anticoagulation [Z79.01] 05/21/2016  . Schizo affective schizophrenia ([place]) [F25.0] 09/25/2015  . Hypertension [I10] 09/25/2015  . Suicidal ideation [R45.851] 09/25/2015    Total Time spent with patient: 1 hour  Subjective:   Lance [name] is a 40 y.o. male patient admitted with "I think my schizophrenia is acting up".  HPI:  Patient interviewed. Chart reviewed. Case reviewed with emergency room physician. Labs reviewed. This is a 40 year old man with a long history of mental illness. He presented to the emergency room initially complaining of nausea and vomiting but then made it clear that his mental health issues were at least as big of a complaint. When I spoke to him he tells me that his mood has been worse for several days. He feels like his mind is not working. He is having auditory hallucinations that are disturbing him. He is having thoughts that he will cut himself or get into a fight with someone. He says that he has not slept in 2 or 3 days. Patient claims that he has been compliant with all of his usual medicine. He is complaining of nausea and complains that he was throwing up earlier today but denies fever or chills or other specific medical symptoms. He denies that he's been abusing drugs or alcohol. Labs are fairly unremarkable. Reviewed the situation with the emergency room doctor who feels that  his symptoms are probably more primarily psychiatric and there is no specific medical diagnoses. Problems with people at his group home and feels like he is going to lose his temper if he has to stay there. He is vague about what the issue is.  Medical history: Multiple medical problems including diabetes, high blood pressure, distant history of pulmonary embolisms recurrently that have resulted in chronic anticoagulation.  Substance abuse history: History of abuse of marijuana but has not been using drugs or alcohol anytime recently.  Social history: Patient I believe has a guardian. He is estranged from any family and is essentially chronically institutionalized.  Past Psychiatric History: Patient is had multiple psychiatric admissions and encounters. He does have a history of self-mutilation and a history of serious suicide attempts in the past. Used to be a very frequent user of services at the hospital until he was put on clozapine and stabilized on clozapine and mood stabilizers. Takes clozapine and lithium and some other antipsychotics at times. Does have a history of some aggression in the distant past but has not been aggressive recently.  Risk to Self: Is patient at risk for suicide?: Yes Risk to Others:   Prior Inpatient Therapy:   Prior Outpatient Therapy:    Past Medical History:  Past Medical History:  Diagnosis Date  . Depression   . Diabetes mellitus without complication ([place])   . Hyperlipidemia   . Hypertension   . Schizo affective schizophrenia ([place])    History reviewed. No pertinent surgical history. Family History: History reviewed. No pertinent family history. Family Psychiatric  History: Patient does not know of  any family history Social History:  History  Alcohol Use No    Comment: occassionally     History  Drug Use No    Social History   Social History  . Marital status: Single    Spouse name: N/A  . Number of children: N/A  . Years of education: N/A    Social History Main Topics  . Smoking status: Current Every Day Smoker    Packs/day: 1.00    Types: Cigarettes  . Smokeless tobacco: Never Used  . Alcohol use No     Comment: occassionally  . Drug use: No  . Sexual activity: Not Asked   Other Topics Concern  . None   Social History Narrative  . None   Additional Social History:    Allergies:   Allergies  Allergen Reactions  . Penicillins     Labs:  Results for orders placed or performed during the hospital encounter of 05/21/16 (from the past 48 hour(s))  Comprehensive metabolic panel     Status: Abnormal   Collection Time: 05/21/16 12:47 PM  Result Value Ref Range   Sodium 141 135 - 145 mmol/L   Potassium 3.9 3.5 - 5.1 mmol/L   Chloride 106 101 - 111 mmol/L   CO2 22 22 - 32 mmol/L   Glucose, Bld 109 (H) 65 - 99 mg/dL   BUN 19 6 - 20 mg/dL   Creatinine, Ser 0.98 0.61 - 1.24 mg/dL   Calcium 9.9 8.9 - 10.3 mg/dL   Total Protein 7.8 6.5 - 8.1 g/dL   Albumin 5.2 (H) 3.5 - 5.0 g/dL   AST 25 15 - 41 U/L   ALT 24 17 - 63 U/L   Alkaline Phosphatase 55 38 - 126 U/L   Total Bilirubin 1.1 0.3 - 1.2 mg/dL   GFR calc non Af Amer >60 >60 mL/min   GFR calc Af Amer >60 >60 mL/min    Comment: (NOTE) The eGFR has been calculated using the CKD EPI equation. This calculation has not been validated in all clinical situations. eGFR's persistently <60 mL/min signify possible Chronic Kidney Disease.    Anion gap 13 5 - 15  Ethanol     Status: None   Collection Time: 05/21/16 12:47 PM  Result Value Ref Range   Alcohol, Ethyl (B) <5 <5 mg/dL    Comment:        LOWEST DETECTABLE LIMIT FOR SERUM ALCOHOL IS 5 mg/dL FOR MEDICAL PURPOSES ONLY   Salicylate level     Status: None   Collection Time: 05/21/16 12:47 PM  Result Value Ref Range   Salicylate Lvl <9.5 2.8 - 30.0 mg/dL  Acetaminophen level     Status: Abnormal   Collection Time: 05/21/16 12:47 PM  Result Value Ref Range   Acetaminophen (Tylenol), Serum <10 (L) 10 - 30  ug/mL    Comment:        THERAPEUTIC CONCENTRATIONS VARY SIGNIFICANTLY. A RANGE OF 10-30 ug/mL MAY BE AN EFFECTIVE CONCENTRATION FOR MANY PATIENTS. HOWEVER, SOME ARE BEST TREATED AT CONCENTRATIONS OUTSIDE THIS RANGE. ACETAMINOPHEN CONCENTRATIONS >150 ug/mL AT 4 HOURS AFTER INGESTION AND >50 ug/mL AT 12 HOURS AFTER INGESTION ARE OFTEN ASSOCIATED WITH TOXIC REACTIONS.   cbc     Status: None   Collection Time: 05/21/16 12:47 PM  Result Value Ref Range   WBC 9.1 3.8 - 10.6 K/uL   RBC 5.13 4.40 - 5.90 MIL/uL   Hemoglobin 16.3 13.0 - 18.0 g/dL   HCT 46.9 40.0 - 52.0 %  MCV 91.5 80.0 - 100.0 fL   MCH 31.8 26.0 - 34.0 pg   MCHC 34.8 32.0 - 36.0 g/dL   RDW 12.8 11.5 - 14.5 %   Platelets 274 150 - 440 K/uL  Lipase, blood     Status: None   Collection Time: 05/21/16 12:47 PM  Result Value Ref Range   Lipase 22 11 - 51 U/L  Protime-INR     Status: Abnormal   Collection Time: 05/21/16 12:57 PM  Result Value Ref Range   Prothrombin Time 15.9 (H) 11.4 - 15.2 seconds   INR 1.26     Current Facility-Administered Medications  Medication Dose Route Frequency Provider Last Rate Last Dose  . asenapine (SAPHRIS) sublingual tablet 5 mg  5 mg Sublingual TID Gonzella Lex, MD      . cloZAPine (CLOZARIL) tablet 100 mg  100 mg Oral Daily Gonzella Lex, MD      . cloZAPine (CLOZARIL) tablet 300 mg  300 mg Oral QHS Lurine Imel T Kennedey Digilio, MD      . lisinopril (PRINIVIL,ZESTRIL) tablet 2.5 mg  2.5 mg Oral Daily Nani Ingram T Kendre Jacinto, MD      . lithium carbonate capsule 900 mg  900 mg Oral QHS Dearion Huot T Rylynn Kobs, MD      . metFORMIN (GLUCOPHAGE) tablet 1,000 mg  1,000 mg Oral BID WC Payne Garske T Filiberto Wamble, MD      . mometasone-formoterol (DULERA) 200-5 MCG/ACT inhaler 2 puff  2 puff Inhalation BID Gonzella Lex, MD      . ondansetron (ZOFRAN-ODT) 4 MG disintegrating tablet           . PARoxetine (PAXIL-CR) 24 hr tablet 25 mg  25 mg Oral Daily Gonzella Lex, MD      . promethazine (PHENERGAN) tablet 25 mg  25 mg Oral  Q6H PRN Gonzella Lex, MD      . simvastatin (ZOCOR) tablet 40 mg  40 mg Oral q1800 Gonzella Lex, MD      . traZODone (DESYREL) tablet 100 mg  100 mg Oral QHS Gonzella Lex, MD      . warfarin (COUMADIN) tablet 7.5 mg  7.5 mg Oral q1800 Gonzella Lex, MD       Current Outpatient Prescriptions  Medication Sig Dispense Refill  . asenapine (SAPHRIS) 5 MG SUBL 24 hr tablet Place 5 mg under the tongue 3 (three) times daily.    . cloZAPine (CLOZARIL) 100 MG tablet Take 100-300 mg by mouth 2 (two) times daily. 133m in the morning and 307mat bedtime    . Fluticasone-Salmeterol (ADVAIR) 250-50 MCG/DOSE AEPB Inhale 1 puff into the lungs 2 (two) times daily.    . Marland Kitchenetoconazole (NIZORAL) 2 % cream Apply 1 application topically daily. 60 g 2  . lisinopril (PRINIVIL,ZESTRIL) 2.5 MG tablet Take 2.5 mg by mouth daily.    . Marland Kitchenithium carbonate 300 MG capsule Take 900 mg by mouth at bedtime.    . metFORMIN (GLUCOPHAGE) 1000 MG tablet Take 1,000 mg by mouth 2 (two) times daily with a meal.    . omega-3 acid ethyl esters (LOVAZA) 1 g capsule Take 1 g by mouth daily.    . Marland KitchenARoxetine (PAXIL-CR) 25 MG 24 hr tablet Take 25 mg by mouth daily.    . simvastatin (ZOCOR) 40 MG tablet Take 40 mg by mouth at bedtime.    . traZODone (DESYREL) 100 MG tablet Take 100 mg by mouth at bedtime.    . Marland Kitchenarfarin (COUMADIN) 10 MG  tablet Take 10 mg by mouth every other day.    . warfarin (COUMADIN) 5 MG tablet Take 5 mg by mouth every other day.    . ziprasidone (GEODON) 80 MG capsule Take 80 mg by mouth 2 (two) times daily with a meal.      Musculoskeletal: Strength & Muscle Tone: within normal limits Gait & Station: normal Patient leans: N/A  Psychiatric Specialty Exam: Physical Exam  Nursing note and vitals reviewed. Constitutional: He appears well-developed and well-nourished.  HENT:  Head: Normocephalic and atraumatic.  Eyes: Conjunctivae are normal. Pupils are equal, round, and reactive to light.  Neck: Normal  range of motion.  Cardiovascular: Regular rhythm and normal heart sounds.   Respiratory: Effort normal. No respiratory distress.  GI: Soft. There is tenderness. There is guarding.  Musculoskeletal: Normal range of motion.  Neurological: He is alert.  Skin: Skin is warm and dry.  Psychiatric: His mood appears anxious. His speech is delayed and tangential. He is slowed, withdrawn and actively hallucinating. Thought content is paranoid and delusional. He expresses impulsivity. He exhibits a depressed mood. He expresses suicidal ideation. He exhibits abnormal recent memory.    Review of Systems  Constitutional: Negative.   HENT: Negative.   Eyes: Negative.   Respiratory: Negative.   Cardiovascular: Negative.   Gastrointestinal: Positive for nausea and vomiting.  Musculoskeletal: Negative.   Skin: Negative.   Neurological: Negative.   Psychiatric/Behavioral: Positive for depression, hallucinations and suicidal ideas. Negative for substance abuse. The patient is nervous/anxious and has insomnia.     Blood pressure 121/67, pulse 68, temperature 98.1 F (36.7 C), temperature source Oral, resp. rate 18, height '5\' 7"'  (1.702 m), weight 95.3 kg (210 lb), SpO2 96 %.Body mass index is 32.89 kg/m.  General Appearance: Casual  Eye Contact:  Minimal  Speech:  Slow  Volume:  Decreased  Mood:  Anxious and Depressed  Affect:  Constricted  Thought Process:  Disorganized  Orientation:  Full (Time, Place, and Person)  Thought Content:  Hallucinations: Auditory  Suicidal Thoughts:  Yes.  with intent/plan  Homicidal Thoughts:  Yes.  without intent/plan  Memory:  Immediate;   Good Recent;   Fair Remote;   Fair  Judgement:  Fair  Insight:  Fair  Psychomotor Activity:  Decreased  Concentration:  Concentration: Fair  Recall:  [employer] of Knowledge:  Fair  Language:  Fair  Akathisia:  No  Handed:  Right  AIMS (if indicated):     Assets:  Communication Skills Desire for Improvement Financial  Resources/Insurance Housing  ADL's:  Intact  Cognition:  Impaired,  Mild  Sleep:        Treatment Plan Summary: Daily contact with patient to assess and evaluate symptoms and progress in treatment, Medication management and Plan 40 year old man with schizoaffective disorder depressive type. Presents with depression suicidal ideation worsening hallucinations and paranoia. He is complaining of nausea and vomiting. Labs are not particularly remarkable. I have asked them to check a lithium level that's the one thing that we haven't done yet that could explain the nausea and vomiting. He doesn't however look otherwise particularly sick and is otherwise seeming to be medically stable and mostly complaining of psychiatric illness. I will go ahead and put in orders to admit him to the psychiatry unit. Continue outpatient medicine. Case reviewed with TTS and ER physician.  Disposition: Recommend psychiatric Inpatient admission when medically cleared. Supportive therapy provided about ongoing stressors.  Alethia Berthold, MD 05/21/2016 4:40 PM

## 2016-05-21 NOTE — ED Notes (Signed)
Pt was given apple juice with RN approval/ Pt calm and cooperative. Environment is safe. Pt in no signs of distress at this time. Nothing is needed from staff/ Pt is seen sitting in bed awake

## 2016-05-21 NOTE — ED Provider Notes (Signed)
[facility] Emergency Department Provider Note    First MD Initiated Contact with Patient 05/21/16 1316     (approximate)  I have reviewed the triage vital signs and the nursing notes.   HISTORY  Chief Complaint Emesis; Cough; Abdominal Pain; and Suicidal    HPI Lance [name] is a 40 y.o. male with history of schizophrenia presents with 24 hours of nausea vomiting and diarrhea. Patient describing epigastric discomfort. Stated toward the end of his emesis he started seeing some blood-tinged streaks. No blood in his diarrhea. No reported sick contacts. No shortness of breath or chest pain. States that he is having worsening hallucinations and seeing angels and demons. States that they're telling him to cut his wrist. Has been taking his medications as directed.   Past Medical History:  Diagnosis Date  . Depression   . Diabetes mellitus without complication (HCC)   . Hyperlipidemia   . Hypertension   . Schizo affective schizophrenia (HCC)    History reviewed. No pertinent family history. History reviewed. No pertinent surgical history. Patient Active Problem List   Diagnosis Date Noted  . Schizo affective schizophrenia (HCC) 09/25/2015  . Hypertension 09/25/2015  . Suicidal ideation 09/25/2015      Prior to Admission medications   Medication Sig Start Date End Date Taking? Authorizing Provider  asenapine (SAPHRIS) 5 MG SUBL 24 hr tablet Place 5 mg under the tongue 3 (three) times daily.    Historical Provider, MD  cloZAPine (CLOZARIL) 100 MG tablet Take 100-300 mg by mouth 2 (two) times daily. 100mg  in the morning and 300mg  at bedtime    Historical Provider, MD  Fluticasone-Salmeterol (ADVAIR) 250-50 MCG/DOSE AEPB Inhale 1 puff into the lungs 2 (two) times daily.    Historical Provider, MD  ketoconazole (NIZORAL) 2 % cream Apply 1 application topically daily. 04/23/16   Vivi BarrackMatthew R Wagoner, DPM  lisinopril (PRINIVIL,ZESTRIL) 2.5 MG tablet Take 2.5 mg  by mouth daily.    Historical Provider, MD  lithium carbonate 300 MG capsule Take 900 mg by mouth at bedtime.    Historical Provider, MD  metFORMIN (GLUCOPHAGE) 1000 MG tablet Take 1,000 mg by mouth 2 (two) times daily with a meal.    Historical Provider, MD  omega-3 acid ethyl esters (LOVAZA) 1 g capsule Take 1 g by mouth daily.    Historical Provider, MD  PARoxetine (PAXIL-CR) 25 MG 24 hr tablet Take 25 mg by mouth daily.    Historical Provider, MD  simvastatin (ZOCOR) 40 MG tablet Take 40 mg by mouth at bedtime.    Historical Provider, MD  traZODone (DESYREL) 100 MG tablet Take 100 mg by mouth at bedtime.    Historical Provider, MD  warfarin (COUMADIN) 10 MG tablet Take 10 mg by mouth every other day.    Historical Provider, MD  warfarin (COUMADIN) 5 MG tablet Take 5 mg by mouth every other day.    Historical Provider, MD  ziprasidone (GEODON) 80 MG capsule Take 80 mg by mouth 2 (two) times daily with a meal.    Historical Provider, MD    Allergies Penicillins    Social History Social History  Substance Use Topics  . Smoking status: Current Every Day Smoker    Packs/day: 1.00    Types: Cigarettes  . Smokeless tobacco: Never Used  . Alcohol use No     Comment: occassionally    Review of Systems Patient denies headaches, rhinorrhea, blurry vision, numbness, shortness of breath, chest pain, edema, cough, abdominal  pain, nausea, vomiting, diarrhea, dysuria, fevers, rashes or hallucinations unless otherwise stated above in HPI. ____________________________________________   PHYSICAL EXAM:  VITAL SIGNS: Vitals:   05/21/16 1244  BP: 121/67  Pulse: 68  Resp: 18  Temp: 98.1 F (36.7 C)    Constitutional: Alert and oriented. Well appearing and in no acute distress. Eyes: Conjunctivae are normal. PERRL. EOMI. Head: Atraumatic. Nose: No congestion/rhinnorhea. Mouth/Throat: Mucous membranes are moist.  Oropharynx non-erythematous. Neck: No stridor. Painless ROM. No cervical  spine tenderness to palpation Hematological/Lymphatic/Immunilogical: No cervical lymphadenopathy. Cardiovascular: Normal rate, regular rhythm. Grossly normal heart sounds.  Good peripheral circulation. Respiratory: Normal respiratory effort.  No retractions. Lungs CTAB. Gastrointestinal: Soft and nontender. No distention. No abdominal bruits. No CVA tenderness. Genitourinary:  Musculoskeletal: No lower extremity tenderness nor edema.  No joint effusions. Neurologic:  Normal speech and language. No gross focal neurologic deficits are appreciated. No gait instability. Skin:  Skin is warm, dry and intact. No rash noted. Psychiatric: Mood and affect are normal. Speech and behavior are normal.  ____________________________________________   LABS (all labs ordered are listed, but only abnormal results are displayed)  Results for orders placed or performed during the hospital encounter of 05/21/16 (from the past 24 hour(s))  cbc     Status: None   Collection Time: 05/21/16 12:47 PM  Result Value Ref Range   WBC 9.1 3.8 - 10.6 K/uL   RBC 5.13 4.40 - 5.90 MIL/uL   Hemoglobin 16.3 13.0 - 18.0 g/dL   HCT 30.846.9 65.740.0 - 84.652.0 %   MCV 91.5 80.0 - 100.0 fL   MCH 31.8 26.0 - 34.0 pg   MCHC 34.8 32.0 - 36.0 g/dL   RDW 96.212.8 95.211.5 - 84.114.5 %   Platelets 274 150 - 440 K/uL   ____________________________________________  ____________________________________________  RADIOLOGY  I personally reviewed all radiographic images ordered to evaluate for the above acute complaints and reviewed radiology reports and findings.  These findings were personally discussed with the patient.  Please see medical record for radiology report.  ____________________________________________   PROCEDURES  Procedure(s) performed: none Procedures    Critical Care performed: no ____________________________________________   INITIAL IMPRESSION / ASSESSMENT AND PLAN / ED COURSE  Pertinent labs & imaging results that  were available during my care of the patient were reviewed by me and considered in my medical decision making (see chart for details).  DDX: Psychosis, delirium, medication effect, noncompliance, polysubstance abuse, Si, Hi, depression   Lance AlbrightJames P Hazelrigg is a 40 y.o. who presents to the ED with for evaluation of N/V/D and SI with hallucinations.  Patient has psych history of schizophrenia.  Laboratory testing was ordered to evaluation for underlying electrolyte derangement or signs of underlying organic pathology to explain today's presentation.  Based on history and physical and laboratory evaluation, it appears that the patient's presentation is 2/2 underlying psychiatric disorder and will require further evaluation and management by inpatient psychiatry. Patient medically stable.   Patient was  made an IVC due to SI with command hallucinations.  Disposition pending psychiatric evaluation.   Clinical Course as of May 21 1602  Thu May 21, 2016  1424 Blood work and radiographs are reassuring.  [PR]    Clinical Course User Index [PR] Willy EddyPatrick Cyrene Gharibian, MD   Being evaluated by Dr. Tona Sensinglapacks at this time.   ____________________________________________   FINAL CLINICAL IMPRESSION(S) / ED DIAGNOSES  Final diagnoses:  Suicidal ideation  Nausea vomiting and diarrhea      NEW MEDICATIONS STARTED DURING THIS  VISIT:  New Prescriptions   No medications on file     Note:  This document was prepared using Dragon voice recognition software and may include unintentional dictation errors.    Willy Eddy, MD 05/21/16 [phone]

## 2016-05-22 ENCOUNTER — Encounter: Payer: Self-pay | Admitting: Internal Medicine

## 2016-05-22 DIAGNOSIS — F319 Bipolar disorder, unspecified: Secondary | ICD-10-CM

## 2016-05-22 DIAGNOSIS — F259 Schizoaffective disorder, unspecified: Secondary | ICD-10-CM

## 2016-05-22 DIAGNOSIS — F250 Schizoaffective disorder, bipolar type: Secondary | ICD-10-CM

## 2016-05-22 LAB — LIPID PANEL
CHOL/HDL RATIO: 4.5 ratio
CHOLESTEROL: 143 mg/dL (ref 0–200)
HDL: 32 mg/dL — AB (ref >40)
LDL Cholesterol: 66 mg/dL (ref 0–99)
TRIGLYCERIDES: 225 mg/dL — AB (ref <150)
VLDL: 45 mg/dL — ABNORMAL HIGH (ref 0–40)

## 2016-05-22 LAB — PROTIME-INR
INR: 1.37
Prothrombin Time: 17 seconds — ABNORMAL HIGH (ref 11.4–15.2)

## 2016-05-22 LAB — TSH: TSH: 1.802 u[IU]/mL (ref 0.350–4.500)

## 2016-05-22 LAB — GLUCOSE, CAPILLARY: GLUCOSE-CAPILLARY: 122 mg/dL — AB (ref 65–99)

## 2016-05-22 MED ORDER — INSULIN ASPART 100 UNIT/ML ~~LOC~~ SOLN
0.0000 [IU] | Freq: Three times a day (TID) | SUBCUTANEOUS | Status: DC
  Administered 2016-05-22 – 2016-05-26 (×7): 1 [IU] via SUBCUTANEOUS
  Filled 2016-05-22 (×4): qty 1

## 2016-05-22 MED ORDER — INSULIN ASPART 100 UNIT/ML ~~LOC~~ SOLN: 0.0000 [IU] | Freq: Every day | SUBCUTANEOUS | Status: DC

## 2016-05-22 MED ORDER — NICOTINE 21 MG/24HR TD PT24
21.0000 mg | MEDICATED_PATCH | Freq: Every day | TRANSDERMAL | Status: DC
Start: 2016-05-22 — End: 2016-05-26
  Administered 2016-05-22 – 2016-05-26 (×5): 21 mg via TRANSDERMAL
  Filled 2016-05-22 (×5): qty 1

## 2016-05-22 MED ORDER — LITHIUM CARBONATE ER 450 MG PO TBCR
900.0000 mg | EXTENDED_RELEASE_TABLET | Freq: Every day | ORAL | Status: DC
  Administered 2016-05-22 – 2016-05-24 (×3): 900 mg via ORAL
  Filled 2016-05-22 (×4): qty 2

## 2016-05-22 MED ORDER — ENSURE ENLIVE PO LIQD: 237.0000 mL | Freq: Two times a day (BID) | ORAL | Status: DC

## 2016-05-22 MED ORDER — ASENAPINE MALEATE 5 MG SL SUBL
5.0000 mg | SUBLINGUAL_TABLET | Freq: Three times a day (TID) | SUBLINGUAL | Status: DC
  Administered 2016-05-22 – 2016-05-26 (×13): 5 mg via SUBLINGUAL
  Filled 2016-05-22 (×14): qty 1

## 2016-05-22 MED ORDER — GLUCERNA SHAKE PO LIQD
237.0000 mL | Freq: Two times a day (BID) | ORAL | Status: DC
  Administered 2016-05-23 – 2016-05-26 (×7): 237 mL via ORAL

## 2016-05-22 NOTE — Progress Notes (Signed)
Writer paged hospitalist to follow up on status of consult. Pt is sleeping soundly at this time. Vomiting resolved following IM Phenergran.

## 2016-05-22 NOTE — Progress Notes (Signed)
Initial Nutrition Assessment  DOCUMENTATION CODES:   Obesity unspecified  INTERVENTION:   Ensure Enlive po BID, each supplement provides 350 kcal and 20 grams of protein  NUTRITION DIAGNOSIS:   Inadequate oral intake related to acute illness as evidenced by meal completion < 50%.  GOAL:   Patient will meet greater than or equal to 90% of their needs  MONITOR:   PO intake, Supplement acceptance  REASON FOR ASSESSMENT:   Malnutrition Screening Tool    ASSESSMENT:   40 year old male with schizoaffective disorder,  resident of [place]. He came voluntarily to our emergency department on December 17 complaining of having nausea, vomiting diarrhea, worsening hallucinations, seeing angels and demons. Patient reported hearing voices telling him to cut his wrists.   Met with pt in room today. Pt reluctant to talk and kept walking away. Pt reports N/V resolved. Pt reports that his abdomen is less distended and he is not feeling any pain. Pt reports good appetite pta and stable weights. Per chart, pt has lost 12lbs(6%) over the past 9 months. This is not significant. Pt reports that he is eating ~50% meals. Pt documented to be eating 100% meals. Pt requesting strawberry Ensures.    Medications reviewed and include: insulin, metformin, nicotine, paxil, coumadin   Labs reviewed: wnl   Nutrition-Focused physical exam completed. Findings are no fat depletion, no muscle depletion, and no edema.   Diet Order:  Diet Carb Modified Fluid consistency: Thin; Room service appropriate? Yes  Skin:  Reviewed, no issues  Last BM:  none since admit  Height:   Ht Readings from Last 1 Encounters:  05/21/16 '5\' 7"'  (1.702 m)    Weight:   Wt Readings from Last 1 Encounters:  05/21/16 200 lb (90.7 kg)    Ideal Body Weight:  67.2 kg  BMI:  Body mass index is 31.32 kg/m.  Estimated Nutritional Needs:   Kcal:  1800-2100kcal/day   Protein:  91-109g/day   Fluid:  2L/day    EDUCATION NEEDS:   No education needs identified at this time  Koleen Distance, RD, LDN

## 2016-05-22 NOTE — Progress Notes (Signed)
Recreation Therapy Notes  Date: 12.08.17 Time: 9:30 am Location: Craft Room  Group Topic: Coping Skills  Goal Area(s) Addresses:  Patient will participate in healthy coping skill. Patient will verbalize benefit of using art as a coping skill.  Behavioral Response: Attentive, Interactive  Intervention: Coloring  Activity: Patients were given coloring sheets to color and were instructed to think about what emotions they were feeling and what their minds were focused on.  Education: LRT educated patients on healthy coping skills.  Education Outcome: Acknowledges education/In group clarification offered   Clinical Observations/Feedback: Patient colored coloring sheet. Patient contributed to group discussion by stating what makes art a good coping skill, what his mind was focused on during group, and what emotions he felt during group.  Jacquelynn CreeGreene,Tashae Inda M, LRT/CTRS 05/22/2016 10:19 AM

## 2016-05-22 NOTE — Tx Team (Signed)
Interdisciplinary Treatment and Diagnostic Plan Update  05/22/2016 Time of Session: 10:30am Lance [name] MRN: [id number]  Principal Diagnosis: Schizoaffective disorder, bipolar type [facility](HCC)  Secondary Diagnoses: Principal Problem:   Schizoaffective disorder, bipolar type (HCC) Active Problems:   Hypertension   Suicidal ideation   Diabetes mellitus without complication (HCC)   Hyperlipidemia   History of pulmonary embolism   Chronic anticoagulation   Current Medications:  Current Facility-Administered Medications  Medication Dose Route Frequency Provider Last Rate Last Dose  . acetaminophen (TYLENOL) tablet 650 mg  650 mg Oral Q6H PRN Audery AmelJohn T Clapacs, MD   650 mg at 05/22/16 0617  . alum & mag hydroxide-simeth (MAALOX/MYLANTA) 200-200-20 MG/5ML suspension 30 mL  30 mL Oral Q4H PRN Audery AmelJohn T Clapacs, MD      . asenapine (SAPHRIS) sublingual tablet 5 mg  5 mg Sublingual TID Jimmy FootmanAndrea Hernandez-Gonzalez, MD      . cloZAPine (CLOZARIL) tablet 100 mg  100 mg Oral Daily Audery AmelJohn T Clapacs, MD      . cloZAPine (CLOZARIL) tablet 300 mg  300 mg Oral QHS Audery AmelJohn T Clapacs, MD   300 mg at 05/21/16 2225  . Influenza vac split quadrivalent PF (FLUARIX) injection 0.5 mL  0.5 mL Intramuscular Tomorrow-1000 Jimmy FootmanAndrea Hernandez-Gonzalez, MD      . lisinopril (PRINIVIL,ZESTRIL) tablet 2.5 mg  2.5 mg Oral Daily Audery AmelJohn T Clapacs, MD      . lithium carbonate (ESKALITH) CR tablet 900 mg  900 mg Oral QHS Jimmy FootmanAndrea Hernandez-Gonzalez, MD      . magnesium hydroxide (MILK OF MAGNESIA) suspension 30 mL  30 mL Oral Daily PRN Audery AmelJohn T Clapacs, MD      . metFORMIN (GLUCOPHAGE) tablet 1,000 mg  1,000 mg Oral BID WC Audery AmelJohn T Clapacs, MD      . mometasone-formoterol (DULERA) 200-5 MCG/ACT inhaler 2 puff  2 puff Inhalation BID Audery AmelJohn T Clapacs, MD      . ondansetron (ZOFRAN-ODT) disintegrating tablet 8 mg  8 mg Oral Q8H PRN Jolanta B Pucilowska, MD      . PARoxetine (PAXIL-CR) 24 hr tablet 25 mg  25 mg Oral Daily Audery AmelJohn T Clapacs, MD      .  pneumococcal 23 valent vaccine (PNU-IMMUNE) injection 0.5 mL  0.5 mL Intramuscular Tomorrow-1000 Jimmy FootmanAndrea Hernandez-Gonzalez, MD      . promethazine (PHENERGAN) tablet 25 mg  25 mg Oral Q6H PRN Audery AmelJohn T Clapacs, MD   25 mg at 05/21/16 2224  . simvastatin (ZOCOR) tablet 40 mg  40 mg Oral q1800 Audery AmelJohn T Clapacs, MD      . traZODone (DESYREL) tablet 100 mg  100 mg Oral QHS Audery AmelJohn T Clapacs, MD   100 mg at 05/21/16 2225  . warfarin (COUMADIN) tablet 7.5 mg  7.5 mg Oral q1800 Jimmy FootmanAndrea Hernandez-Gonzalez, MD   7.5 mg at 05/21/16 2224   PTA Medications: Prescriptions Prior to Admission  Medication Sig Dispense Refill Last Dose  . asenapine (SAPHRIS) 5 MG SUBL 24 hr tablet Place 5 mg under the tongue 3 (three) times daily.   unknown at unknown  . cloZAPine (CLOZARIL) 100 MG tablet Take 100-300 mg by mouth 2 (two) times daily. 100mg  in the morning and 300mg  at bedtime   unknown at unknown  . Fluticasone-Salmeterol (ADVAIR) 250-50 MCG/DOSE AEPB Inhale 1 puff into the lungs 2 (two) times daily.   unknown at unknown  . ketoconazole (NIZORAL) 2 % cream Apply 1 application topically daily. 60 g 2   . lisinopril (PRINIVIL,ZESTRIL) 2.5 MG tablet Take 2.5  mg by mouth daily.   unknown at unknown  . lithium carbonate 300 MG capsule Take 900 mg by mouth at bedtime.   unknown at unknown  . metFORMIN (GLUCOPHAGE) 1000 MG tablet Take 1,000 mg by mouth 2 (two) times daily with a meal.   unknown at unknown  . omega-3 acid ethyl esters (LOVAZA) 1 g capsule Take 1 g by mouth daily.   unknown at unknown  . PARoxetine (PAXIL-CR) 25 MG 24 hr tablet Take 25 mg by mouth daily.   unknown at unknown  . simvastatin (ZOCOR) 40 MG tablet Take 40 mg by mouth at bedtime.   unknown at unknown  . traZODone (DESYREL) 100 MG tablet Take 100 mg by mouth at bedtime.   unknown at unknown  . warfarin (COUMADIN) 10 MG tablet Take 10 mg by mouth every other day.   unknown at unknown  . warfarin (COUMADIN) 5 MG tablet Take 5 mg by mouth every other day.    unknown at unknown  . ziprasidone (GEODON) 80 MG capsule Take 80 mg by mouth 2 (two) times daily with a meal.   unknown at unknown    Patient Stressors: Health problems Traumatic event  Patient Strengths: Ability for insight Active sense of humor Average or above average intelligence Communication skills Motivation for treatment/growth  Treatment Modalities: Medication Management, Group therapy, Case management,  1 to 1 session with clinician, Psychoeducation, Recreational therapy.   Physician Treatment Plan for Primary Diagnosis: Schizoaffective disorder, bipolar type (HCC) Long Term Goal(s):     Short Term Goals:    Medication Management: Evaluate patient's response, side effects, and tolerance of medication regimen.  Therapeutic Interventions: 1 to 1 sessions, Unit Group sessions and Medication administration.  Evaluation of Outcomes: Progressing  Physician Treatment Plan for Secondary Diagnosis: Principal Problem:   Schizoaffective disorder, bipolar type (HCC) Active Problems:   Hypertension   Suicidal ideation   Diabetes mellitus without complication (HCC)   Hyperlipidemia   History of pulmonary embolism   Chronic anticoagulation  Long Term Goal(s):     Short Term Goals:       Medication Management: Evaluate patient's response, side effects, and tolerance of medication regimen.  Therapeutic Interventions: 1 to 1 sessions, Unit Group sessions and Medication administration.  Evaluation of Outcomes: Progressing   RN Treatment Plan for Primary Diagnosis: Schizoaffective disorder, bipolar type (HCC) Long Term Goal(s): Knowledge of disease and therapeutic regimen to maintain health will improve  Short Term Goals: Ability to verbalize frustration and anger appropriately will improve, Ability to verbalize feelings will improve, Ability to identify and develop effective coping behaviors will improve and Compliance with prescribed medications will improve  Medication  Management: RN will administer medications as ordered by provider, will assess and evaluate patient's response and provide education to patient for prescribed medication. RN will report any adverse and/or side effects to prescribing provider.  Therapeutic Interventions: 1 on 1 counseling sessions, Psychoeducation, Medication administration, Evaluate responses to treatment, Monitor vital signs and CBGs as ordered, Perform/monitor CIWA, COWS, AIMS and Fall Risk screenings as ordered, Perform wound care treatments as ordered.  Evaluation of Outcomes: Progressing   LCSW Treatment Plan for Primary Diagnosis: Schizoaffective disorder, bipolar type (HCC) Long Term Goal(s): Safe transition to appropriate next level of care at discharge, Engage patient in therapeutic group addressing interpersonal concerns.  Short Term Goals: Engage patient in aftercare planning with referrals and resources, Increase social support, Increase ability to appropriately verbalize feelings, Increase emotional regulation, Facilitate acceptance of mental health diagnosis  and concerns and Increase skills for wellness and recovery  Therapeutic Interventions: Assess for all discharge needs, 1 to 1 time with Social worker, Explore available resources and support systems, Assess for adequacy in community support network, Educate family and significant other(s) on suicide prevention, Complete Psychosocial Assessment, Interpersonal group therapy.  Evaluation of Outcomes: Progressing   Progress in Treatment: Attending groups: No. CSW still assessing, pt new to milieu Participating in groups: No. CSW still assessing, pt new to milieu Taking medication as prescribed: Yes. Toleration medication: Yes. Family/Significant other contact made: Yes, individual(s) contacted:  pt's legal guardian Lance [name] Patient understands diagnosis: Yes. Discussing patient identified problems/goals with staff: Yes. Medical problems stabilized or  resolved: Yes. Denies suicidal/homicidal ideation: No. Issues/concerns per patient self-inventory: Yes. Other: none  New problem(s) identified: No, Describe:  none listed  New Short Term/Long Term Goal(s):  Discharge Plan or Barriers:   Reason for Continuation of Hospitalization: Anxiety Hallucinations Homicidal ideation Suicidal ideation  Estimated Length of Stay: Date of discharge 05/25/16  Attendees: Patient:  05/22/2016 10:26 AM  Physician: Dr. Radene JourneyAndrea Hernandez, MD  05/22/2016 10:26 AM  Nursing: Leonia ReaderPhyllis Cobb, RN  05/22/2016 10:26 AM  RN Care Manager: 05/22/2016 10:26 AM  Social Worker: Dorothe PeaJonathan F. Durward ParcelRiffey, LCSWA, [name]   05/22/2016 10:26 AM  Recreational Therapist: Hershal CoriaBeth Greene, LRT, CTRS  05/22/2016 10:26 AM    Scribe for Treatment Team: Mercy RidingJonathan F Aishani Kalis, LCSWA 05/22/2016 10:26 AM

## 2016-05-22 NOTE — Consult Note (Signed)
[facility] [place] [facility]   PATIENT NAME: Lance [name]    MR#:  [id number]  DATE OF BIRTH:  1975-11-20  DATE OF ADMISSION:  05/21/2016  PRIMARY CARE PHYSICIAN: Corky DownsMASOUD,JAVED, MD   REQUESTING/REFERRING PHYSICIAN: Dr. Mordecai RasmussenJohn Clapacs  CHIEF COMPLAINT:  No chief complaint on file.   HISTORY OF PRESENT ILLNESS:  Lance [name]  is a 40 y.o. male with a known history of Presents to hospital secondary to changes in behavior and auditory hallucinations. Patient also had intractable nausea and vomiting on presentation. He is admitted to behavioral medicine service secondary to his auditory hallucinations and paranoid behavior. Patient was sent in because he hasn't slept in 2 or 3 days at all. And his hallucinations were disturbing him. He has been constantly nauseated and vomiting in the last couple of days. Denies any abdominal pain or diarrhea. No fevers or chills. No travel or recent change in his medications. He feels like the people at his group home are very disrespectful and full of 8 red. His trazodone might have been held for a couple of days which caused his insomnia according to the patient. Medicine consult was requested for his nausea and vomiting. Patient states he received IV medication in the emergency room yesterday for his nausea and since then hasn't had any episodes of nausea and vomiting. He has tolerated his breakfast well this morning.  PAST MEDICAL HISTORY:   Past Medical History:  Diagnosis Date  . Depression   . Diabetes mellitus without complication (HCC)   . Hyperlipidemia   . Hypertension   . Schizo affective schizophrenia (HCC)     PAST SURGICAL HISTOIRY:  History reviewed. No pertinent surgical history.  SOCIAL HISTORY:   Social History  Substance Use Topics  . Smoking status: Current Every Day Smoker    Packs/day: 1.00    Years: 23.00    Types: Cigarettes  . Smokeless tobacco: Never Used  . Alcohol use No     Comment:  occassionally    FAMILY HISTORY:   Family History  Problem Relation Age of Onset  . CAD Mother   . CAD Sister     DRUG ALLERGIES:   Allergies  Allergen Reactions  . Penicillins     REVIEW OF SYSTEMS:   Review of Systems  Constitutional: Negative for chills, fever, malaise/fatigue and weight loss.  HENT: Negative for ear discharge, ear pain, hearing loss, nosebleeds and tinnitus.   Eyes: Negative for blurred vision, double vision and photophobia.  Respiratory: Negative for cough, hemoptysis, shortness of breath and wheezing.   Cardiovascular: Negative for chest pain, palpitations, orthopnea and leg swelling.  Gastrointestinal: Positive for nausea. Negative for abdominal pain, constipation, diarrhea, heartburn, melena and vomiting.  Genitourinary: Negative for dysuria, frequency and urgency.  Musculoskeletal: Negative for back pain, myalgias and neck pain.  Skin: Negative for rash.  Neurological: Negative for dizziness, tingling, tremors, sensory change, speech change, focal weakness and headaches.  Endo/Heme/Allergies: Does not bruise/bleed easily.  Psychiatric/Behavioral: Positive for hallucinations. Negative for depression. The patient has insomnia.     MEDICATIONS AT HOME:   Prior to Admission medications   Medication Sig Start Date End Date Taking? Authorizing Provider  asenapine (SAPHRIS) 5 MG SUBL 24 hr tablet Place 5 mg under the tongue 3 (three) times daily.    Historical Provider, MD  cloZAPine (CLOZARIL) 100 MG tablet Take 100-300 mg by mouth 2 (two) times daily. 100mg  in the morning and 300mg  at bedtime    Historical Provider,  MD  Fluticasone-Salmeterol (ADVAIR) 250-50 MCG/DOSE AEPB Inhale 1 puff into the lungs 2 (two) times daily.    Historical Provider, MD  ketoconazole (NIZORAL) 2 % cream Apply 1 application topically daily. 04/23/16   Vivi Barrack, DPM  lisinopril (PRINIVIL,ZESTRIL) 2.5 MG tablet Take 2.5 mg by mouth daily.    Historical Provider, MD   lithium carbonate 300 MG capsule Take 900 mg by mouth at bedtime.    Historical Provider, MD  metFORMIN (GLUCOPHAGE) 1000 MG tablet Take 1,000 mg by mouth 2 (two) times daily with a meal.    Historical Provider, MD  omega-3 acid ethyl esters (LOVAZA) 1 g capsule Take 1 g by mouth daily.    Historical Provider, MD  PARoxetine (PAXIL-CR) 25 MG 24 hr tablet Take 25 mg by mouth daily.    Historical Provider, MD  simvastatin (ZOCOR) 40 MG tablet Take 40 mg by mouth at bedtime.    Historical Provider, MD  traZODone (DESYREL) 100 MG tablet Take 100 mg by mouth at bedtime.    Historical Provider, MD  warfarin (COUMADIN) 10 MG tablet Take 10 mg by mouth every other day.    Historical Provider, MD  warfarin (COUMADIN) 5 MG tablet Take 5 mg by mouth every other day.    Historical Provider, MD  ziprasidone (GEODON) 80 MG capsule Take 80 mg by mouth 2 (two) times daily with a meal.    Historical Provider, MD      VITAL SIGNS:  Blood pressure 115/65, pulse 65, temperature 97.7 F (36.5 C), temperature source Oral, resp. rate 18, height 5\' 7"  (1.702 m), weight 90.7 kg (200 lb), SpO2 97 %.  PHYSICAL EXAMINATION:   Physical Exam  GENERAL:  40 y.o.-year-old patient lying in the bed with no acute distress.  EYES: Pupils equal, round, reactive to light and accommodation. No scleral icterus. Extraocular muscles intact.  HEENT: Head atraumatic, normocephalic. Oropharynx and nasopharynx clear.  NECK:  Supple, no jugular venous distention. No thyroid enlargement, no tenderness.  LUNGS: Normal breath sounds bilaterally, no wheezing, rales,rhonchi or crepitation. No use of accessory muscles of respiration. Decreased bibasilar breath sounds. CARDIOVASCULAR: S1, S2 normal. No murmurs, rubs, or gallops.  ABDOMEN: Soft, nontender, nondistended. Bowel sounds present. No organomegaly or mass.  EXTREMITIES: No pedal edema, cyanosis, or clubbing.  NEUROLOGIC: Cranial nerves II through XII are intact. Muscle strength 5/5  in all extremities. Sensation intact. Gait normal.  PSYCHIATRIC: The patient is alert and oriented x 3. Slightly slow to respond. SKIN: No obvious rash, lesion, or ulcer.   LABORATORY PANEL:   CBC  Recent Labs Lab 05/21/16 1247  WBC 9.1  HGB 16.3  HCT 46.9  PLT 274   ------------------------------------------------------------------------------------------------------------------  Chemistries   Recent Labs Lab 05/21/16 1247  NA 141  K 3.9  CL 106  CO2 22  GLUCOSE 109*  BUN 19  CREATININE 0.98  CALCIUM 9.9  AST 25  ALT 24  ALKPHOS 55  BILITOT 1.1   ------------------------------------------------------------------------------------------------------------------  Cardiac Enzymes No results for input(s): TROPONINI in the last 168 hours. ------------------------------------------------------------------------------------------------------------------  RADIOLOGY:  Dg Abdomen Acute W/chest  Result Date: 05/21/2016 CLINICAL DATA:  Nausea, vomiting and diarrhea since last evening. EXAM: DG ABDOMEN ACUTE W/ 1V CHEST COMPARISON:  08/15/2015 CXR FINDINGS: There is no evidence of dilated bowel loops or free intraperitoneal air. Possible 2-3 mm interpolar left renal calculus seen only on 1 image. Heart size and mediastinal contours are within normal limits. Both lungs are clear. IMPRESSION: Unremarkable chest radiographs. Possible 2-3  mm interpolar left renal calculus. No bowel obstruction or free air. Electronically Signed   By: Tollie Ethavid  Kwon M.D.   On: 05/21/2016 14:20    EKG:   Orders placed or performed in visit on 05/21/16  . EKG 12-Lead  . EKG 12-Lead    IMPRESSION AND PLAN:   Lance [name]  is a 40 y.o. male with a known history of Presents to hospital secondary to changes in behavior and auditory hallucinations. Patient also had intractable nausea and vomiting on presentation.  #1 Intractable nausea and vomiting- developed after 2 days of not sleeping. - resolved  now, slept better last night. - zofran ODT prn - tolerating diet well. No further changes. Labs are normal - KUB on admission with no obstruction seen  #2 Schizoaffective disorder with hallucinations- mgmt per psychiatry On clozaril, lithium- f/u level. Also on paxil  #3 DM- a1c pending, metformin Can start SSI if sugars are elevated  #4 Insomnia- on trazodone  #5 H/o Chronic DVTs- if compliant, resume coumadin, INR low- pharmacy to adjust dose   Will sign off, please call if any questions.   All the records are reviewed and case discussed with Consulting provider. Management plans discussed with the patient, family and they are in agreement.  CODE STATUS: Full code  TOTAL TIME TAKING CARE OF THIS PATIENT: 50 minutes.    Enid BaasKALISETTI,Maddalena Linarez M.D on 05/22/2016 at 11:03 AM  Between 7am to 6pm - Pager - [phone]  After 6pm go to [email or web address] - password [email or web address] [facility]  [facility]  Office  [phone]  CC: Primary care Physician: Corky DownsMASOUD,JAVED, MD
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
## 2016-05-22 NOTE — [facility] Group Notes (Signed)
[facility] Group Notes:  (Nursing/MHT/Case Management/Adjunct)  Date:  05/22/2016  Time:  5:13 PM  Type of Therapy:  Group Therapy  Participation Level:  Minimal  Participation Quality:  Inattentive  Affect:  Flat  Cognitive:  Lacking  Insight:  Lacking  Engagement in Group:  Lacking  Modes of Intervention:  Activity  Summary of Progress/Problems:  Lance [name] Lance [name] 05/22/2016, 5:13 PM

## 2016-05-22 NOTE — [facility] Counselor (Addendum)
Adult Comprehensive Assessment  Patient ID: Lance [name], male   DOB: 10-24-1975, 40 y.o.   MRN: [id number]  Information Source: Information source:  (Legal guardian)  Current Stressors:  Educational / Learning stressors: N/A Employment / Job issues: N/A Family Relationships: N/A Surveyor, quantityinancial / Lack of resources (include bankruptcy): N/A Housing / Lack of housing: Pt desires a different group home due to  [employer] with a staff member a tthe pt's group home Physical health (include injuries & life threatening diseases): Pt has a multitude of health problems Social relationships: N/A Substance abuse: N/A Bereavement / Loss: N/A  Living/Environment/Situation:  Living Arrangements:  (Group Home) Living conditions (as described by patient or guardian): Pt does not like group home and wants a change How long has patient lived in current situation?: 2 years What is atmosphere in current home: Supportive  Family History:  Marital status: Single Does patient have children?: No  Childhood History:  By whom was/is the patient raised?: Mother Description of patient's relationship with caregiver when they were a child: Good Patient's description of current relationship with people who raised him/her: Mother is deceased How were you disciplined when you got in trouble as a child/adolescent?: (P) Unknown Does patient have siblings?: Yes Number of Siblings: 1 Description of patient's current relationship with siblings: Sister is deceased Did patient suffer any verbal/emotional/physical/sexual abuse as a child?: No (Unknown) Did patient suffer from severe childhood neglect?:  (Unknown) Has patient ever been sexually abused/assaulted/raped as an adolescent or adult?: No (Unknown) Witnessed domestic violence?: No Has patient been effected by domestic violence as an adult?: No  Education:  Highest grade of school patient has completed: Some high school Name of school: Unknown Learning  disability?: Yes What learning problems does patient have?: Unknown  Employment/Work Situation:   Employment situation: On disability Why is patient on disability: Mental Health Issues How long has patient been on disability: since he was an adolescent, per the legal giuardian What is the longest time patient has a held a job?: Unknown Has patient ever been in the [employer] and Companymilitary?: No  Financial Resources:   Surveyor, quantityinancial resources: (P) Receives SSI, Medicaid Does patient have a Lawyerrepresentative payee or guardian?: (P) Yes Name of representative payee or guardian: Victory DakinVivian Harris of [employer] at ph: [phone]  Alcohol/Substance Abuse:   What has been your use of drugs/alcohol within the last 12 months?: Thought to be none, per the legal guardian If attempted suicide, did drugs/alcohol play a role in this?: No Alcohol/Substance Abuse Treatment Hx: Denies past history Has alcohol/substance abuse ever caused legal problems?: No  Social Support System:   Patient's Community Support System: Fair Development worker, communityDescribe Community Support System: Legal Guardian Type of faith/religion: Unknown How does patient's faith help to cope with current illness?: N/A  Leisure/Recreation:   Leisure and Hobbies: Unknown  Strengths/Needs:   What things does the patient do well?: Unknown In what areas does patient struggle / problems for patient: Adapting to environments  Discharge Plan:   Does patient have access to transportation?: Yes Will patient be returning to same living situation after discharge?: Yes Currently receiving community mental health services: Yes (From Whom) If no, would patient like referral for services when discharged?:  [employer]([name]) Does patient have financial barriers related to discharge medications?: No  Summary/Recommendations:   Summary and Recommendations (to be completed by the evaluator): Patient is a 40 year old who presented to the hospital voluntarily and was admitted for  nausea and vomiting and then reported he had  AVH and worsening symptoms of depression.  Pt's primary diagnosis is Schizoaffective disorder, bipolar type (HCC).  Pt reports primary triggers for admission were feeling sick.  Pt reports his stressors are conflicts with others and frustrations involving housing changes.  Pt now denies SI/HI/AVH.  [place]  Pt lists supports in the community as his group home, his legal guardian and his friend Dorinda HillDonald.  Patient will benefit from crisis stabilization, medication evaluation, group therapy, and psycho education in addition to case management for discharge planning. Patient and CSW reviewed pt's identified goals and treatment plan. Pt verbalized understanding and agreed to treatment plan.  At discharge it is recommended that patient remain compliant with established plan and continue treatment.  Dorothe PeaJonathan F Adra Shepler. 05/22/2016

## 2016-05-22 NOTE — Progress Notes (Signed)
Out of his room asking for his belongings, requested Ginger ale & provided, no c/o nausea but patient is noted for distended abdomen: ??SBO, remains on 15 minutes check for safety.

## 2016-05-22 NOTE — Progress Notes (Signed)
Affect flat.  Denies SI/HI/AVH.  Rates depression as 5.  Paces halls. Medicated x1 for right foot pain with marginal results.  Medication and group compliant.  Support and encouragement offered.  Safety maintained.
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
## 2016-05-22 NOTE — Progress Notes (Signed)
ANTICOAGULATION CONSULT NOTE - Initial Consult  Pharmacy Consult for Coumadin Indication: VTE prophylaxis  Allergies  Allergen Reactions  . Penicillins     Patient Measurements: Height: 5\' 7"  (170.2 cm) Weight: 200 lb (90.7 kg) IBW/kg (Calculated) : 66.1    Vital Signs: Temp: 97.7 F (36.5 C) (12/08 0732) Temp Source: Oral (12/08 0732) BP: 115/65 (12/08 0732) Pulse Rate: 65 (12/08 0732)  Labs:  Recent Labs  05/21/16 1247 05/21/16 1257 05/22/16 0646  HGB 16.3  --   --   HCT 46.9  --   --   PLT 274  --   --   LABPROT  --  15.9* 17.0*  INR  --  1.26 1.37  CREATININE 0.98  --   --     Estimated Creatinine Clearance: 106.5 mL/min (by C-G formula based on SCr of 0.98 mg/dL).   Medical History: Past Medical History:  Diagnosis Date  . Depression   . Diabetes mellitus without complication (HCC)   . Hyperlipidemia   . Hypertension   . Schizo affective schizophrenia (HCC)     Medications:  Scheduled:  . asenapine  5 mg Sublingual TID  . cloZAPine  100 mg Oral Daily  . cloZAPine  300 mg Oral QHS  . Influenza vac split quadrivalent PF  0.5 mL Intramuscular Tomorrow-1000  . lisinopril  2.5 mg Oral Daily  . lithium carbonate  900 mg Oral QHS  . metFORMIN  1,000 mg Oral BID WC  . mometasone-formoterol  2 puff Inhalation BID  . PARoxetine  25 mg Oral Daily  . pneumococcal 23 valent vaccine  0.5 mL Intramuscular Tomorrow-1000  . simvastatin  40 mg Oral q1800  . traZODone  100 mg Oral QHS  . warfarin  7.5 mg Oral q1800    Assessment: Pharmacy to monitor Coumadin therapy for VTE prophylaxis. PTA Coumadin dose was 5 mg alternating with 10mg  daily.  Admitting MD ordered 7.5mg  daily.  Goal of Therapy:  INR 2-3 Monitor platelets by anticoagulation protocol: Yes   Plan:  INR 12/7:  1.26      Warfarin 7.5mg  INR 12/8:  1.37   Continue current dose as INR is trending up.  Recheck INR daily and adjust as needed.  Malaiyah Achorn K 05/22/2016,9:07 AM

## 2016-05-22 NOTE — [facility] Group Notes (Signed)
[facility] LCSW Group Therapy   05/22/2016 1 PM   Type of Therapy: Group Therapy   Participation Level: Active   Participation Quality: Attentive, Sharing and Supportive   Affect: Appropriate   Cognitive: Alert and Oriented   Insight: Developing/Improving and Engaged   Engagement in Therapy: Developing/Improving and Engaged   Modes of Intervention: Clarification, Confrontation, Discussion, Education, Exploration, Limit-setting, Orientation, Problem-solving, Rapport Building, Dance movement psychotherapisteality Testing, Socialization and Support   Summary of Progress/Problems: The topic for today was feelings about relapse. Pt discussed what relapse prevention is to them and identified triggers that they are on the path to relapse. Pt processed their feeling towards relapse and was able to relate to peers. Pt discussed coping skills that can be used for relapse prevention. Pt defined relapse as "going back to doing the negative things you were doing." He identified symptoms such as stress, being around negative people, and anger as warning signs of a potential relapse. Pt stated to reduce exposure to relapse, he will follow-up with his outpatient providers.    Hampton AbbotKadijah Treydon Henricks, MSW, LCSWA 05/22/2016, 1:54PM

## 2016-05-22 NOTE — Progress Notes (Signed)
CH responded to an OR for spiritual guidance. Pt was walking the hall. Pt stated that he wanted to turn his life around and stop thinking bad thoughts. Pt felt that the "evil one" was trying to take over him. Pt admits to being a [place]. CH encouraged the Pt to focus on the good and positive thoughts. CH encouraged the Pt to reflect on 1 John 4:4  "Greater is He that is in you, than he that is in the world".  CH provided the ministry of encouragement, listening, and prayer. CH is available for follow up as needed.    05/22/16 1300  Clinical Encounter Type  Visited With Patient  Visit Type Initial;Spiritual support  Referral From Nurse  Consult/Referral To Chaplain  Spiritual Encounters  Spiritual Needs Prayer;Emotional

## 2016-05-22 NOTE — Progress Notes (Signed)
Patient ID: Lance AlbrightJames P [name], male   DOB: March 29, 1976, 40 y.o.   MRN: [id number] CSW spoke to [place] [name] and [employer] [place] at ph: [phone] and email: [email or web address] and requested guardianship papers from Miss Tiburcio PeaHarris via phone and email on 05/22/16.  Dorothe PeaJonathan F. Nkenge Sonntag, LCSWA, [name]  05/22/16

## 2016-05-23 LAB — PROLACTIN: Prolactin: 4.8 ng/mL (ref 4.0–15.2)

## 2016-05-23 LAB — PROTIME-INR
INR: 1.63
Prothrombin Time: 19.5 seconds — ABNORMAL HIGH (ref 11.4–15.2)

## 2016-05-23 NOTE — [facility] Group Notes (Signed)
[facility] LCSW Group Therapy  05/23/2016 2:35 PM  Type of Therapy:  Group Therapy  Participation Level:  Patient did not attend group. CSW invited patient to group.   Summary of Progress/Problems: Stress management: Patients defined and discussed the topic of stress and the related symptoms and triggers for stress. Patients identified healthy coping skills they would like to try during hospitalization and after discharge to manage stress in a healthy way. CSW offered insight to varying stress management techniques.   Praise Stennett G. Garnette CzechSampson MSW, LCSWA 05/23/2016, 2:36 PM

## 2016-05-23 NOTE — Progress Notes (Signed)
ANTICOAGULATION CONSULT NOTE - Initial Consult  Pharmacy Consult for Coumadin Indication: VTE prophylaxis  Allergies  Allergen Reactions  . Penicillins     Patient Measurements: Height: 5\' 7"  (170.2 cm) Weight: 200 lb (90.7 kg) IBW/kg (Calculated) : 66.1    Vital Signs: Temp: 97.8 F (36.6 C) (12/09 0707) Temp Source: Oral (12/09 0707) BP: 116/68 (12/09 0707) Pulse Rate: 72 (12/09 0707)  Labs:  Recent Labs  05/21/16 1247 05/21/16 1257 05/22/16 0646 05/23/16 0734  HGB 16.3  --   --   --   HCT 46.9  --   --   --   PLT 274  --   --   --   LABPROT  --  15.9* 17.0* 19.5*  INR  --  1.26 1.37 1.63  CREATININE 0.98  --   --   --     Estimated Creatinine Clearance: 106.5 mL/min (by C-G formula based on SCr of 0.98 mg/dL).   Medical History: Past Medical History:  Diagnosis Date  . Depression   . Diabetes mellitus without complication (HCC)   . Hyperlipidemia   . Hypertension   . Schizo affective schizophrenia (HCC)     Medications:  Scheduled:  . asenapine  5 mg Sublingual TID  . cloZAPine  100 mg Oral Daily  . cloZAPine  300 mg Oral QHS  . feeding supplement (GLUCERNA SHAKE)  237 mL Oral BID BM  . insulin aspart  0-5 Units Subcutaneous QHS  . insulin aspart  0-9 Units Subcutaneous TID WC  . lithium carbonate  900 mg Oral QHS  . metFORMIN  1,000 mg Oral BID WC  . mometasone-formoterol  2 puff Inhalation BID  . nicotine  21 mg Transdermal Daily  . PARoxetine  25 mg Oral Daily  . simvastatin  40 mg Oral q1800  . traZODone  100 mg Oral QHS  . warfarin  7.5 mg Oral q1800    Assessment: Pharmacy to monitor Coumadin therapy for VTE prophylaxis. PTA Coumadin dose was 5 mg alternating with 10mg  daily.  Admitting MD ordered 7.5mg  daily.  Date  INR Warfarin Dose 12/7 1.26 7.5mg  12/8 1.37 7.5mg  12/9 1.63    Goal of Therapy:  INR 2-3 Monitor platelets by anticoagulation protocol: Yes   Plan:  INR subtherapeutic but trending up, will continue current  dose of 7.5mg  daily, recheck INR and CBC with AM labs.  Mckenzy Salazar C 05/23/2016,10:11 AM

## 2016-05-23 NOTE — Progress Notes (Signed)
D: Pt denies SI/HI/AVH. Pt is pleasant and cooperative, affect is flat and sad. Pt expressed concerns about the  corn on his right foot, he c/o pain and he was medicated per standing PRN order. Patient was noted pacing on hallways, appears less anxious, minimal interaction with with peers and staff noted. .  A: Pt was offered support and encouragement. Pt was given scheduled medications. Pt was encouraged to attend groups. Q 15 minute checks were done for safety.  R:Pt attends groups and interacts well with peers and staff. Pt is taking medication. Pt receptive to treatment and safety maintained on unit.

## 2016-05-23 NOTE — Progress Notes (Signed)
Denies SI/HI/AVH.  Affect flat.  Paces halls.  Minimal interaction.  Good appetite.  Support and encouragement offered.  Safety maintained.

## 2016-05-24 LAB — CBC
HCT: 44.5 % (ref 40.0–52.0)
HEMOGLOBIN: 15.5 g/dL (ref 13.0–18.0)
MCH: 32.1 pg (ref 26.0–34.0)
MCHC: 34.9 g/dL (ref 32.0–36.0)
MCV: 92 fL (ref 80.0–100.0)
Platelets: 188 10*3/uL (ref 150–440)
RBC: 4.84 MIL/uL (ref 4.40–5.90)
RDW: 12.8 % (ref 11.5–14.5)
WBC: 4.2 10*3/uL (ref 3.8–10.6)

## 2016-05-24 LAB — HEMOGLOBIN A1C
HEMOGLOBIN A1C: 5.4 % (ref 4.8–5.6)
MEAN PLASMA GLUCOSE: 108 mg/dL

## 2016-05-24 LAB — PROTIME-INR
INR: 1.88
PROTHROMBIN TIME: 21.9 s — AB (ref 11.4–15.2)

## 2016-05-24 NOTE — Progress Notes (Signed)
Patient has been pacing halls all day and responding to some stimuli. Has been seen taking in halls when no other person is around. Keeps asking about his clothes that he says are "locked up." Denies SI/HI/AVH. Did attend group this afternoon. Has been interacting with staff and other peers. Remains on Q15 minute checks for safety. Will continue to monitor.

## 2016-05-24 NOTE — Progress Notes (Signed)
ANTICOAGULATION CONSULT NOTE - Initial Consult  Pharmacy Consult for Coumadin Indication: VTE prophylaxis  Allergies  Allergen Reactions  . Penicillins     Patient Measurements: Height: 5\' 7"  (170.2 cm) Weight: 200 lb (90.7 kg) IBW/kg (Calculated) : 66.1    Vital Signs: Temp: 97.7 F (36.5 C) (12/10 0708) Temp Source: Oral (12/10 0708) BP: 100/82 (12/10 0708) Pulse Rate: 63 (12/10 0708)  Labs:  Recent Labs  05/21/16 1247  05/22/16 0646 05/23/16 0734 05/24/16 0710  HGB 16.3  --   --   --  15.5  HCT 46.9  --   --   --  44.5  PLT 274  --   --   --  188  LABPROT  --   < > 17.0* 19.5* 21.9*  INR  --   < > 1.37 1.63 1.88  CREATININE 0.98  --   --   --   --   < > = values in this interval not displayed.  Estimated Creatinine Clearance: 106.5 mL/min (by C-G formula based on SCr of 0.98 mg/dL).   Medical History: Past Medical History:  Diagnosis Date  . Depression   . Diabetes mellitus without complication (HCC)   . Hyperlipidemia   . Hypertension   . Schizo affective schizophrenia (HCC)     Medications:  Scheduled:  . asenapine  5 mg Sublingual TID  . cloZAPine  100 mg Oral Daily  . cloZAPine  300 mg Oral QHS  . feeding supplement (GLUCERNA SHAKE)  237 mL Oral BID BM  . insulin aspart  0-5 Units Subcutaneous QHS  . insulin aspart  0-9 Units Subcutaneous TID WC  . lithium carbonate  900 mg Oral QHS  . metFORMIN  1,000 mg Oral BID WC  . mometasone-formoterol  2 puff Inhalation BID  . nicotine  21 mg Transdermal Daily  . PARoxetine  25 mg Oral Daily  . simvastatin  40 mg Oral q1800  . traZODone  100 mg Oral QHS  . warfarin  7.5 mg Oral q1800    Assessment: Pharmacy to monitor warfarin for 40 yo male on chronic anticoagulation with history significant for DVT. Patient currently ordered warfarin 7.5mg  daily for goal INR 2-3.    Date  INR Warfarin Dose 12/7 1.26 7.5mg  12/8 1.37 7.5mg  12/9 1.63 7.5mg   12/10   1.88 7.5mg      Goal of Therapy:  INR  2-3 Monitor platelets by anticoagulation protocol: Yes   Plan:  Will continue warfarin 7.5mg  and obtain follow-up INR with am labs.   Pharmacy will continue to monitor and adjust per consult.   Chae Shuster L 05/24/2016,8:09 AM

## 2016-05-24 NOTE — [facility] Group Notes (Signed)
[facility] LCSW Group Therapy  05/24/2016 2:29 PM  Type of Therapy:  Group Therapy  Participation Level:  Active  Participation Quality:  Attentive and Sharing  Affect:  Appropriate  Cognitive:  Lacking  Insight:  Limited  Engagement in Therapy:  Engaged  Modes of Intervention:  Activity, Discussion, Education, Socialization and Support  Summary of Progress/Problems:Coping Skills: Patients defined and discussed healthy coping skills. Patients identified healthy coping skills they would like to try during hospitalization and after discharge. CSW offered insight to varying coping skills that may have been new to patients such as practicing mindfulness.  Haedyn Breau G. Garnette CzechSampson MSW, LCSWA 05/24/2016, 2:35 PM

## 2016-05-24 NOTE — Plan of Care (Signed)
Problem: Nutritional: Goal: Maintenance of adequate nutrition will improve Outcome: Progressing Adequate nutrition is improving. Patient eating all meals
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
## 2016-05-25 LAB — GLUCOSE, CAPILLARY
GLUCOSE-CAPILLARY: 106 mg/dL — AB (ref 65–99)
GLUCOSE-CAPILLARY: 128 mg/dL — AB (ref 65–99)
GLUCOSE-CAPILLARY: 168 mg/dL — AB (ref 65–99)
GLUCOSE-CAPILLARY: 106 mg/dL — AB (ref 65–99)
GLUCOSE-CAPILLARY: 91 mg/dL (ref 65–99)
GLUCOSE-CAPILLARY: 129 mg/dL — AB (ref 65–99)
Glucose-Capillary: 130 mg/dL — ABNORMAL HIGH (ref 65–99)
Glucose-Capillary: 116 mg/dL — ABNORMAL HIGH (ref 65–99)
Glucose-Capillary: 124 mg/dL — ABNORMAL HIGH (ref 65–99)
Glucose-Capillary: 121 mg/dL — ABNORMAL HIGH (ref 65–99)
Glucose-Capillary: 124 mg/dL — ABNORMAL HIGH (ref 65–99)
Glucose-Capillary: 153 mg/dL — ABNORMAL HIGH (ref 65–99)
Glucose-Capillary: 142 mg/dL — ABNORMAL HIGH (ref 65–99)

## 2016-05-25 LAB — PROTIME-INR
INR: 2.06
PROTHROMBIN TIME: 23.5 s — AB (ref 11.4–15.2)

## 2016-05-25 MED ORDER — LITHIUM CARBONATE ER 450 MG PO TBCR: 900.0000 mg | EXTENDED_RELEASE_TABLET | Freq: Every day | ORAL | 1 refills | Status: DC

## 2016-05-25 MED ORDER — ASENAPINE MALEATE 5 MG SL SUBL: 5.0000 mg | SUBLINGUAL_TABLET | Freq: Three times a day (TID) | SUBLINGUAL | 1 refills | Status: DC

## 2016-05-25 NOTE — Progress Notes (Signed)
Recreation Therapy Notes  Date: 12.11.17 Time: 9:30 am Location: Craft Room  Group Topic: Self-expression  Goal Area(s) Addresses:  Patient will effectively use art as a means of self-expression. Patient will recognize positive benefit of self-expression. Patient will be able to identify one emotion experienced during group session. Patient will identify use of art as a coping skill.  Behavioral Response: Did not attend  Intervention: Two Faces of Me  Activity: Patients were given a blank face worksheet and were instructed to draw a line down the middle. On one side, they were instructed to draw or write how they felt when they were admitted to the hospital. On the other side, they were instructed to draw or write how they want to feel when they are d/c.  Education: LRT educated patients on other forms of self-expression.  Education Outcome: Patient did not attend group.  Clinical Observations/Feedback: Patient did not attend group.  Orine Goga M, LRT/CTRS 05/25/2016 10:09 AM 

## 2016-05-25 NOTE — Progress Notes (Signed)
ANTICOAGULATION CONSULT NOTE - Initial Consult  Pharmacy Consult for Coumadin Indication: VTE prophylaxis  Allergies  Allergen Reactions  . Penicillins     Patient Measurements: Height: 5\' 7"  (170.2 cm) Weight: 200 lb (90.7 kg) IBW/kg (Calculated) : 66.1    Vital Signs: Temp: 98 F (36.7 C) (12/11 0700) Temp Source: Oral (12/11 0700) BP: 100/64 (12/11 0700) Pulse Rate: 61 (12/11 0700)  Labs:  Recent Labs  05/23/16 0734 05/24/16 0710 05/25/16 0700  HGB  --  15.5  --   HCT  --  44.5  --   PLT  --  188  --   LABPROT 19.5* 21.9* 23.5*  INR 1.63 1.88 2.06    Estimated Creatinine Clearance: 106.5 mL/min (by C-G formula based on SCr of 0.98 mg/dL).   Medical History: Past Medical History:  Diagnosis Date  . Depression   . Diabetes mellitus without complication (HCC)   . Hyperlipidemia   . Hypertension   . Schizo affective schizophrenia (HCC)     Medications:  Scheduled:  . asenapine  5 mg Sublingual TID  . cloZAPine  100 mg Oral Daily  . cloZAPine  300 mg Oral QHS  . feeding supplement (GLUCERNA SHAKE)  237 mL Oral BID BM  . insulin aspart  0-5 Units Subcutaneous QHS  . insulin aspart  0-9 Units Subcutaneous TID WC  . lithium carbonate  900 mg Oral QHS  . metFORMIN  1,000 mg Oral BID WC  . mometasone-formoterol  2 puff Inhalation BID  . nicotine  21 mg Transdermal Daily  . PARoxetine  25 mg Oral Daily  . simvastatin  40 mg Oral q1800  . traZODone  100 mg Oral QHS  . warfarin  7.5 mg Oral q1800    Assessment: Pharmacy to monitor warfarin for 40 yo male on chronic anticoagulation with history significant for DVT. Patient currently ordered warfarin 7.5mg  daily for goal INR 2-3.    Date  INR Warfarin Dose 12/7 1.26 7.5mg  12/8 1.37 7.5mg  12/9 1.63 7.5mg   12/10   1.88 7.5mg   12/11    2.06 7.5mg     Goal of Therapy:  INR 2-3 Monitor platelets by anticoagulation protocol: Yes   Plan:  Will continue warfarin 7.5mg  and obtain follow-up INR with am  labs.   Pharmacy will continue to monitor and adjust per consult.   Cedar Ditullio K 05/25/2016,9:07 AM

## 2016-05-25 NOTE — [facility] Group Notes (Signed)
[facility] LCSW Group Therapy   05/25/2016 1pm Type of Therapy: Group Therapy   Participation Level: Active   Participation Quality: Attentive, Sharing and Supportive   Affect: Appropriate  Cognitive: Alert and Oriented   Insight: Developing/Improving and Engaged   Engagement in Therapy: Developing/Improving and Engaged   Modes of Intervention: Clarification, Confrontation, Discussion, Education, Exploration,  Limit-setting, Orientation, Problem-solving, Rapport Building, Dance movement psychotherapisteality Testing, Socialization and Support   Summary of Progress/Problems: Pt identified obstacles faced currently and processed barriers involved in overcoming these obstacles. Pt identified steps necessary for overcoming these obstacles and explored motivation (internal and external) for facing these difficulties head on. Pt further identified one area of concern in their lives and chose a goal to focus on for today.  Pt shared the pt's goal was for the pt's prayers to be answered.  Pt shared an obstacle for the pt's goal was negative people.  Pt shared a way the pt can overcome the pt's barrier was to continue to use the pt's faith and to "remain focused".  Pt shared the pt hopes to achieve more happiness as a result.  Pt was polite and cooperative with the CSW and other group members and focused and attentive to the topics discussed and the sharing of others.   Lance PeaJonathan F. Sabriah Hobbins, LCSWA, [name]

## 2016-05-25 NOTE — [facility] Suicide Risk Assessment (Deleted)
[facility] INPATIENT:  Family/Significant Other Suicide Prevention Education  Suicide Prevention Education:  Patient Refusal for Family/Significant Other Suicide Prevention Education: The patient Lance [name] has refused to provide written consent for family/significant other to be provided Family/Significant Other Suicide Prevention Education during admission and/or prior to discharge.  Physician notified.  Pt refused SPE from then CSW.   Dorothe PeaJonathan F Holbert Caples 05/25/2016, 5:02 PM

## 2016-05-25 NOTE — Progress Notes (Signed)
Patient has been pacing all day and asking about where his clothes are located. Patient only came to the unit with a pair of boots and a watch per documentation. Called [facility] and regular ED to locate patients clothes, unsuccessful. Patient does endorse passive SI and AH stating demons are telling him to hurt himself. Does verbally contract for safety. Complained of foot pain and was given prn medication. Denies VH. Is medication compliant. Remains on Q15 minute checks for safety. Will continue to monitor.

## 2016-05-25 NOTE — Progress Notes (Signed)
  [facility]charge [facility] :  Will you be returning to the same living situation after discharge:  Yes,  pt [place] group home  At discharge, do you have transportation home?: Yes,  pt will be picked up by his group home Do you have the ability to pay for your medications: Yes,  pt will be provided with prescriptions at discharge   Release of information consent forms completed and in the chart;  Patient's signature needed at discharge.  Patient to Follow up at: Follow-up Information    [facility].   Why:  Your ACTT team will arrive at your home between the hours of 1-5pm on Wednesday May 27, 2016 for your hospital follow up for medication management and therapy Contact information: [facility] [address] [place], [place] [address] Ph: [phone] Fax: [phone]       [facility] Follow [facility].   Why:      Please arrive between the hours of 10am-3pm on Tuesday December 12th, 2017 to be admitted for long-term residential mental health care  Contact information: [facility] [address] [place], [place] 843-002-1792- [id number] Phone: [phone]  [phone]          Next level of care provider has access to [facility] Link:no  Safety Planning and Suicide Prevention discussed: Yes,  completed with pt  Have you used any form of tobacco in the last 30 days? (Cigarettes, Smokeless Tobacco, Cigars, and/or Pipes): Yes  Has patient been referred to the Quitline?: Patient refused referral  Patient has been referred for addiction treatment: N/A  Dorothe PeaJonathan F Finnigan Warriner 05/25/2016, 4:58 PM

## 2016-05-25 NOTE — Plan of Care (Signed)
Problem: Coping: Goal: Ability to cope will improve Outcome: Progressing Pt able to utilize coping skills such as pacing to reduce anxiety this shift [place]

## 2016-05-25 NOTE — Progress Notes (Signed)
D: Patient is alert and oriented on the unit this shift. Patient not attended and actively participated in groups today. Patient denies suicidal ideation, homicidal ideation, auditory or visual hallucinations at the present time.  A: Scheduled medications are administered to patient as per MD orders. Emotional support and encouragement are provided. Patient is maintained on q.15 minute safety checks. Patient is informed to notify staff with questions or concerns. R: No adverse medication reactions are noted. Patient is cooperative with medication administration   Patient is receptive, isolative,anxious ,paces   and cooperative on the unit at this time. Patient does not interact  with others on the unit this shift. Patient contracts for safety at this time. Patient remains safe at this time.                                                   anxiety 6/10 Depression 6/10
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
## 2016-05-25 NOTE — Plan of Care (Signed)
Problem: Safety: Goal: Ability to disclose and discuss suicidal ideas will improve Outcome: Progressing Patient is able to disclose if he is feeling suicidal   

## 2016-05-26 LAB — PROTIME-INR
INR: 1.93
Prothrombin Time: 22.3 seconds — ABNORMAL HIGH (ref 11.4–15.2)

## 2016-05-26 LAB — LITHIUM LEVEL: LITHIUM LVL: 0.44 mmol/L — AB (ref 0.60–1.20)

## 2016-05-26 MED ORDER — WARFARIN SODIUM 4 MG PO TABS
8.0000 mg | ORAL_TABLET | Freq: Once | ORAL | Status: DC
  Filled 2016-05-26: qty 2

## 2016-05-26 MED ORDER — WARFARIN - PHARMACIST DOSING INPATIENT: Freq: Every day | Status: DC

## 2016-05-26 NOTE — Progress Notes (Signed)
Recreation Therapy Notes  Date: 12.12.17 Time: 9:30 am Location: Craft Room  Group Topic: Goal Setting  Goal Area(s) Addresses:  Patient will write at least one goal. Patient will write at least one obstacle.  Behavioral Response: Intermittently Attentive, Intermittently Interactive, Left early  Intervention: Recovery Goal Chart  Activity: Patients were instructed to create a recovery goal chart including goals, obstacles, the date they started working on their goals, and the date they achieved their goals.  Education: LRT educated patients on ways to celebrate reaching their goals in healthy ways.  Education Outcome: In group clarification offered   Clinical Observations/Feedback: Patient wrote goals and obstacles. Patient contributed to group discussion by stating how he can stay focused on his goals. Patient started chewing his finger nails and spitting them on the floor. LRT attempted to redirect patient. Patient kept chewing on his nails. Patient left group at approximately 9:53 am and did not return to group.  Jacquelynn CreeGreene,Jacoya Bauman M, LRT/CTRS 05/26/2016 10:04 AM

## 2016-05-26 NOTE — Plan of Care (Signed)
Problem: Coping: Goal: Ability to verbalize feelings will improve Outcome: Progressing Patient verbalized feelings to staff.    

## 2016-05-26 NOTE — Tx Team (Signed)
Interdisciplinary Treatment and Diagnostic Plan Update  05/26/2016 Time of Session: 10:30am [name] MRN: [id number]  Principal Diagnosis: Schizoaffective disorder, bipolar type [facility]HCC)  Secondary Diagnoses: Principal Problem:   Schizoaffective disorder, bipolar type (HCC) Active Problems:   Hypertension   Suicidal ideation   Diabetes mellitus without complication (HCC)   Hyperlipidemia   History of pulmonary embolism   Chronic anticoagulation   Current Medications:  Current Facility-Administered Medications  Medication Dose Route Frequency Provider Last Rate Last Dose  . acetaminophen (TYLENOL) tablet 650 mg  650 mg Oral Q6H PRN Audery AmelJohn T Clapacs, MD   650 mg at 05/26/16 1116  . alum & mag hydroxide-simeth (MAALOX/MYLANTA) 200-200-20 MG/5ML suspension 30 mL  30 mL Oral Q4H PRN Audery AmelJohn T Clapacs, MD      . asenapine (SAPHRIS) sublingual tablet 5 mg  5 mg Sublingual TID Jimmy FootmanAndrea Hernandez-Gonzalez, MD   5 mg at 05/26/16 1221  . cloZAPine (CLOZARIL) tablet 100 mg  100 mg Oral Daily Audery AmelJohn T Clapacs, MD   100 mg at 05/26/16 0809  . cloZAPine (CLOZARIL) tablet 300 mg  300 mg Oral QHS Audery AmelJohn T Clapacs, MD   300 mg at 05/24/16 2123  . feeding supplement (GLUCERNA SHAKE) (GLUCERNA SHAKE) liquid 237 mL  237 mL Oral BID BM Jimmy FootmanAndrea Hernandez-Gonzalez, MD   237 mL at 05/26/16 1000  . insulin aspart (novoLOG) injection 0-5 Units  0-5 Units Subcutaneous QHS Jimmy FootmanAndrea Hernandez-Gonzalez, MD      . insulin aspart (novoLOG) injection 0-9 Units  0-9 Units Subcutaneous TID WC Jimmy FootmanAndrea Hernandez-Gonzalez, MD   1 Units at 05/26/16 0809  . lithium carbonate (ESKALITH) CR tablet 900 mg  900 mg Oral QHS Jimmy FootmanAndrea Hernandez-Gonzalez, MD   900 mg at 05/24/16 2122  . magnesium hydroxide (MILK OF MAGNESIA) suspension 30 mL  30 mL Oral Daily PRN Audery AmelJohn T Clapacs, MD      . metFORMIN (GLUCOPHAGE) tablet 1,000 mg  1,000 mg Oral BID WC Audery AmelJohn T Clapacs, MD   1,000 mg at 05/26/16 0809  . mometasone-formoterol (DULERA) 200-5 MCG/ACT  inhaler 2 puff  2 puff Inhalation BID Audery AmelJohn T Clapacs, MD   2 puff at 05/25/16 0846  . nicotine (NICODERM CQ - dosed in mg/24 hours) patch 21 mg  21 mg Transdermal Daily Jimmy FootmanAndrea Hernandez-Gonzalez, MD   21 mg at 05/26/16 0811  . PARoxetine (PAXIL-CR) 24 hr tablet 25 mg  25 mg Oral Daily Audery AmelJohn T Clapacs, MD   25 mg at 05/26/16 0820  . simvastatin (ZOCOR) tablet 40 mg  40 mg Oral q1800 Audery AmelJohn T Clapacs, MD   40 mg at 05/25/16 1806  . traZODone (DESYREL) tablet 100 mg  100 mg Oral QHS Audery AmelJohn T Clapacs, MD   100 mg at 05/24/16 2123  . warfarin (COUMADIN) tablet 8 mg  8 mg Oral ONCE-1800 Enid Baasadhika Kalisetti, MD      . Warfarin - Pharmacist Dosing Inpatient   Does not apply [id number] Enid Baasadhika Kalisetti, MD       Current Outpatient Prescriptions  Medication Sig Dispense Refill  . asenapine (SAPHRIS) 5 MG SUBL 24 hr tablet Place 1 tablet (5 mg total) under the tongue 3 (three) times daily. 90 tablet 1  . cloZAPine (CLOZARIL) 100 MG tablet Take 100-300 mg by mouth 2 (two) times daily. 100mg  in the morning and 300mg  at bedtime    . Fluticasone-Salmeterol (ADVAIR) 250-50 MCG/DOSE AEPB Inhale 1 puff into the lungs 2 (two) times daily.    Marland Kitchen. ketoconazole (NIZORAL) 2 % cream Apply 1  application topically daily. 60 g 2  . lithium carbonate (ESKALITH) 450 MG CR tablet Take 2 tablets (900 mg total) by mouth at bedtime. 60 tablet 1  . metFORMIN (GLUCOPHAGE) 1000 MG tablet Take 1,000 mg by mouth 2 (two) times daily with a meal.    . omega-3 acid ethyl esters (LOVAZA) 1 g capsule Take 1 g by mouth daily.    Marland Kitchen PARoxetine (PAXIL-CR) 25 MG 24 hr tablet Take 25 mg by mouth daily.    . simvastatin (ZOCOR) 40 MG tablet Take 40 mg by mouth at bedtime.    . traZODone (DESYREL) 100 MG tablet Take 100 mg by mouth at bedtime.    Marland Kitchen warfarin (COUMADIN) 10 MG tablet Take 10 mg by mouth every other day.    . warfarin (COUMADIN) 5 MG tablet Take 5 mg by mouth every other day.     PTA Medications: No prescriptions prior to admission.     Patient Stressors: Health problems Traumatic event  Patient Strengths: Ability for insight Active sense of humor Average or above average intelligence Communication skills Motivation for treatment/growth  Treatment Modalities: Medication Management, Group therapy, Case management,  1 to 1 session with clinician, Psychoeducation, Recreational therapy.   Physician Treatment Plan for Primary Diagnosis: Schizoaffective disorder, bipolar type (HCC) Long Term Goal(s): Improvement in symptoms so as ready for discharge Improvement in symptoms so as ready for discharge   Short Term Goals: Ability to identify changes in lifestyle to reduce recurrence of condition will improve Ability to demonstrate self-control will improve Ability to identify and develop effective coping behaviors will improve Ability to verbalize feelings will improve Ability to identify and develop effective coping behaviors will improve Ability to identify triggers associated with substance abuse/mental health issues will improve  Medication Management: Evaluate patient's response, side effects, and tolerance of medication regimen.  Therapeutic Interventions: 1 to 1 sessions, Unit Group sessions and Medication administration.  Evaluation of Outcomes: Adequate for discharge   Physician Treatment Plan for Secondary Diagnosis: Principal Problem:   Schizoaffective disorder, bipolar type (HCC) Active Problems:   Hypertension   Suicidal ideation   Diabetes mellitus without complication (HCC)   Hyperlipidemia   History of pulmonary embolism   Chronic anticoagulation  Long Term Goal(s): Improvement in symptoms so as ready for discharge Improvement in symptoms so as ready for discharge   Short Term Goals: Ability to identify changes in lifestyle to reduce recurrence of condition will improve Ability to demonstrate self-control will improve Ability to identify and develop effective coping behaviors will  improve Ability to verbalize feelings will improve Ability to identify and develop effective coping behaviors will improve Ability to identify triggers associated with substance abuse/mental health issues will improve     Medication Management: Evaluate patient's response, side effects, and tolerance of medication regimen.  Therapeutic Interventions: 1 to 1 sessions, Unit Group sessions and Medication administration.  Evaluation of Outcomes: Adequate for discharge    RN Treatment Plan for Primary Diagnosis: Schizoaffective disorder, bipolar type (HCC) Long Term Goal(s): Knowledge of disease and therapeutic regimen to maintain health will improve  Short Term Goals: Ability to verbalize frustration and anger appropriately will improve, Ability to verbalize feelings will improve, Ability to identify and develop effective coping behaviors will improve and Compliance with prescribed medications will improve  Medication Management: RN will administer medications as ordered by provider, will assess and evaluate patient's response and provide education to patient for prescribed medication. RN will report any adverse and/or side effects to prescribing provider.  Therapeutic Interventions: 1 on 1 counseling sessions, Psychoeducation, Medication administration, Evaluate responses to treatment, Monitor vital signs and CBGs as ordered, Perform/monitor CIWA, COWS, AIMS and Fall Risk screenings as ordered, Perform wound care treatments as ordered.  Evaluation of Outcomes: Adequate for discharge    LCSW Treatment Plan for Primary Diagnosis: Schizoaffective disorder, bipolar type (HCC) Long Term Goal(s): Safe transition to appropriate next level of care at discharge, Engage patient in therapeutic group addressing interpersonal concerns.  Short Term Goals: Engage patient in aftercare planning with referrals and resources, Increase social support, Increase ability to appropriately verbalize feelings,  Increase emotional regulation, Facilitate acceptance of mental health diagnosis and concerns and Increase skills for wellness and recovery  Therapeutic Interventions: Assess for all discharge needs, 1 to 1 time with Social worker, Explore available resources and support systems, Assess for adequacy in community support network, Educate family and significant other(s) on suicide prevention, Complete Psychosocial Assessment, Interpersonal group therapy.  Evaluation of Outcomes: Adequate for discharge    Progress in Treatment: Attending groups: No. CSW still assessing, pt new to milieu Participating in groups: No. CSW still assessing, pt new to milieu Taking medication as prescribed: Yes. Toleration medication: Yes. Family/Significant other contact made: Yes, individual(s) contacted:  pt's legal guardian Victory DakinVivian Harris Patient understands diagnosis: Yes. Discussing patient identified problems/goals with staff: Yes. Medical problems stabilized or resolved: Yes. Denies suicidal/homicidal ideation: No. Issues/concerns per patient self-inventory: Yes. Other: none  New problem(s) identified: No, Describe:  none listed  New Short Term/Long Term Goal(s):  Discharge Plan or Barriers:   Reason for Continuation of Hospitalization: Anxiety Hallucinations Homicidal ideation Suicidal ideation  Estimated Length of Stay: Date of discharge 05/26/16  Attendees: Patient:  05/22/2016 10:26 AM  Physician: Dr. Radene JourneyAndrea Hernandez, MD  05/22/2016 10:26 AM  Nursing: Leonia ReaderPhyllis Cobb, RN  05/22/2016 10:26 AM  RN Care Manager: 05/22/2016 10:26 AM  Social Worker: Hampton AbbotKadijah Dionna Wiedemann, MSW, LCSW-A 05/22/2016 10:26 AM  Recreational Therapist: Hershal CoriaBeth Greene, LRT, CTRS  05/22/2016 10:26 AM    Scribe for Treatment Team: Hampton AbbotKadijah Ridley Schewe, MSW, LCSWA 05/26/2016 2:26 PM
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
## 2016-05-26 NOTE — [place] FL2 (Signed)
[place] MEDICAID FL2 LEVEL OF CARE SCREENING TOOL     IDENTIFICATION  Patient Name: Lance AlbrightJames P [name] Birthdate: 1975/09/18 Sex: male Admission Date (Current Location): 05/21/2016  [place]  [place] [id number] [facility] Facility and Address:  [facility], [address], [place], [place] [address]      Provider Number: [id number]  Attending Physician Name and Address:  Barnabas HarriesAndrea Hernandez-Gonzale*  Relative Name and Phone Number:       Current Level of Care: Hospital Recommended Level of Care: Family Care Home (Group home) Prior Approval Number:    Date Approved/Denied:   PASRR Number:    Discharge Plan: Other (Comment) (group home)    Current Diagnoses: Patient Active Problem List   Diagnosis Date Noted  . Schizoaffective disorder, bipolar type (HCC) 05/22/2016  . Diabetes mellitus without complication (HCC) 05/21/2016  . Hyperlipidemia 05/21/2016  . History of pulmonary embolism 05/21/2016  . Chronic anticoagulation 05/21/2016  . Hypertension 09/25/2015  . Suicidal ideation 09/25/2015    Orientation RESPIRATION BLADDER Height & Weight     Self, Time, Situation, Place  Normal Continent Weight: 200 lb (90.7 kg) Height:  5\' 7"  (170.2 cm)  BEHAVIORAL SYMPTOMS/MOOD NEUROLOGICAL BOWEL NUTRITION STATUS      Continent  (N/A)  AMBULATORY STATUS COMMUNICATION OF NEEDS Skin   Independent Verbally Normal                       Personal Care Assistance Level of Assistance              Functional Limitations Info             SPECIAL CARE FACTORS FREQUENCY   (N/A)                    Contractures Contractures Info: Not present    Additional Factors Info                  Current Medications (05/26/2016):  This is the current hospital active medication list Current Facility-Administered Medications  Medication Dose Route Frequency Provider Last Rate Last Dose  . acetaminophen (TYLENOL) tablet 650 mg  650 mg  Oral Q6H PRN Audery AmelJohn T Clapacs, MD   650 mg at 05/26/16 1116  . alum & mag hydroxide-simeth (MAALOX/MYLANTA) 200-200-20 MG/5ML suspension 30 mL  30 mL Oral Q4H PRN Audery AmelJohn T Clapacs, MD      . asenapine (SAPHRIS) sublingual tablet 5 mg  5 mg Sublingual TID Jimmy FootmanAndrea Hernandez-Gonzalez, MD   5 mg at 05/26/16 1221  . cloZAPine (CLOZARIL) tablet 100 mg  100 mg Oral Daily Audery AmelJohn T Clapacs, MD   100 mg at 05/26/16 0809  . cloZAPine (CLOZARIL) tablet 300 mg  300 mg Oral QHS Audery AmelJohn T Clapacs, MD   300 mg at 05/24/16 2123  . feeding supplement (GLUCERNA SHAKE) (GLUCERNA SHAKE) liquid 237 mL  237 mL Oral BID BM Jimmy FootmanAndrea Hernandez-Gonzalez, MD   237 mL at 05/26/16 1000  . insulin aspart (novoLOG) injection 0-5 Units  0-5 Units Subcutaneous QHS Jimmy FootmanAndrea Hernandez-Gonzalez, MD      . insulin aspart (novoLOG) injection 0-9 Units  0-9 Units Subcutaneous TID WC Jimmy FootmanAndrea Hernandez-Gonzalez, MD   1 Units at 05/26/16 0809  . lithium carbonate (ESKALITH) CR tablet 900 mg  900 mg Oral QHS Jimmy FootmanAndrea Hernandez-Gonzalez, MD   900 mg at 05/24/16 2122  . magnesium hydroxide (MILK OF MAGNESIA) suspension 30 mL  30 mL Oral Daily PRN Audery AmelJohn T Clapacs, MD      .  metFORMIN (GLUCOPHAGE) tablet 1,000 mg  1,000 mg Oral BID WC Audery AmelJohn T Clapacs, MD   1,000 mg at 05/26/16 0809  . mometasone-formoterol (DULERA) 200-5 MCG/ACT inhaler 2 puff  2 puff Inhalation BID Audery AmelJohn T Clapacs, MD   2 puff at 05/25/16 0846  . nicotine (NICODERM CQ - dosed in mg/24 hours) patch 21 mg  21 mg Transdermal Daily Jimmy FootmanAndrea Hernandez-Gonzalez, MD   21 mg at 05/26/16 0811  . PARoxetine (PAXIL-CR) 24 hr tablet 25 mg  25 mg Oral Daily Audery AmelJohn T Clapacs, MD   25 mg at 05/26/16 0820  . simvastatin (ZOCOR) tablet 40 mg  40 mg Oral q1800 Audery AmelJohn T Clapacs, MD   40 mg at 05/25/16 1806  . traZODone (DESYREL) tablet 100 mg  100 mg Oral QHS Audery AmelJohn T Clapacs, MD   100 mg at 05/24/16 2123  . warfarin (COUMADIN) tablet 8 mg  8 mg Oral ONCE-1800 Enid Baasadhika Kalisetti, MD      . Warfarin - Pharmacist Dosing  Inpatient   Does not apply [id number] Enid Baasadhika Kalisetti, MD         Discharge Medications: Please see discharge summary for a list of discharge medications.  Relevant Imaging Results:  Relevant Lab Results:   Additional Information    Lynden OxfordKadijah R Anelisse Jacobson, LCSWA

## 2016-05-26 NOTE — [facility] Suicide Risk Assessment (Signed)
[facility]   Principal Problem: Schizoaffective disorder, bipolar type [facility](HCC) Discharge Diagnoses:  Patient Active Problem List   Diagnosis Date Noted  . Schizoaffective disorder, bipolar type (HCC) [F25.0] 05/22/2016  . Diabetes mellitus without complication (HCC) [E11.9] 05/21/2016  . Hyperlipidemia [E78.5] 05/21/2016  . History of pulmonary embolism [Z86.711] 05/21/2016  . Chronic anticoagulation [Z79.01] 05/21/2016  . Hypertension [I10] 09/25/2015  . Suicidal ideation [R45.851] 09/25/2015    Total Time spent with patient: 30 minutes  Musculoskeletal: Strength & Muscle Tone: within normal limits Gait & Station: normal Patient leans: N/A  Psychiatric Specialty Exam: Review of Systems  All other systems reviewed and are negative.   Blood pressure 133/76, pulse 70, temperature 97.8 F (36.6 C), temperature source Oral, resp. rate 18, height 5\' 7"  (1.702 m), weight 90.7 kg (200 lb), SpO2 98 %.Body mass index is 31.32 kg/m.  General Appearance: Casual  Eye Contact::  Good  Speech:  Clear and Coherent409  Volume:  Normal  Mood:  Euthymic  Affect:  Appropriate  Thought Process:  Goal Directed and Descriptions of Associations: Intact  Orientation:  Full (Time, Place, and Person)  Thought Content:  WDL  Suicidal Thoughts:  No  Homicidal Thoughts:  No  Memory:  Immediate;   Fair Recent;   Fair Remote;   Fair  Judgement:  Fair  Insight:  Fair  Psychomotor Activity:  Normal  Concentration:  Fair  Recall:  Fis[employer]Fair  Fund of Knowledge:Fair  Language: Fair  Akathisia:  No  Handed:  Right  AIMS (if indicated):     Assets:  Communication Skills Desire for Improvement Financial Resources/Insurance Housing Resilience Social Support  Sleep:  Number of Hours: 7.15  Cognition: WNL  ADL's:  Intact   Mental Status Per Nursing Assessment::   On Admission:  Suicidal ideation indicated by patient, Self-harm thoughts  Demographic Factors:  Male and  Caucasian  Loss Factors: Decline in physical health  Historical Factors: Prior suicide attempts and Impulsivity  Risk Reduction Factors:   Sense of responsibility to family, Living with another person, especially a relative, Positive social support and Positive therapeutic relationship  Continued Clinical Symptoms:  Schizophrenia:   Depressive state  Cognitive Features That Contribute To Risk:  None    Suicide Risk:  Minimal: No identifiable suicidal ideation.  Patients presenting with no risk factors but with morbid ruminations; may be classified as minimal risk based on the severity of the depressive symptoms  Follow-up Information    [facility]   Why:  Your ACTT team will arrive at your home between the hours of 1-5pm on Wednesday May 27, 2016 for your hospital follow up for medication management and therapy Contact information: [facility] [address] [place], [place] [address] Ph: [phone] Fax: [phone]       [facility] Follow up.   Why:      Please arrive between the hours of 10am-3pm on Tuesday December 12th, 2017 to be admitted for long-term residential mental health care  Contact information: [facility] [address] [place], [place]- [id number] Phone: [phone]  [phone]          Plan Of Care/Follow-up recommendations:  Activity:  as tolerated. Diet:  low sodium heart healthy ADA diet. Other:  keep follow up appointments.  Kristine LineaJolanta Ilham Roughton, MD 05/26/2016, 8:52 AM

## 2016-05-26 NOTE — Progress Notes (Addendum)
ANTICOAGULATION CONSULT NOTE - Follow up Consult  Pharmacy Consult for Coumadin Indication: VTE treatment   Allergies  Allergen Reactions  . Penicillins     Patient Measurements: Height: 5\' 7"  (170.2 cm) Weight: 200 lb (90.7 kg) IBW/kg (Calculated) : 66.1    Vital Signs: Temp: 97.8 F (36.6 C) (12/12 0654) Temp Source: Oral (12/12 0654) BP: 133/76 (12/12 0654) Pulse Rate: 70 (12/12 0654)  Labs:  Recent Labs  05/24/16 0710 05/25/16 0700 05/26/16 0713  HGB 15.5  --   --   HCT 44.5  --   --   PLT 188  --   --   LABPROT 21.9* 23.5* 22.3*  INR 1.88 2.06 1.93    Estimated Creatinine Clearance: 106.5 mL/min (by C-G formula based on SCr of 0.98 mg/dL).   Medical History: Past Medical History:  Diagnosis Date  . Depression   . Diabetes mellitus without complication (HCC)   . Hyperlipidemia   . Hypertension   . Schizo affective schizophrenia (HCC)     Medications:  Scheduled:  . asenapine  5 mg Sublingual TID  . cloZAPine  100 mg Oral Daily  . cloZAPine  300 mg Oral QHS  . feeding supplement (GLUCERNA SHAKE)  237 mL Oral BID BM  . insulin aspart  0-5 Units Subcutaneous QHS  . insulin aspart  0-9 Units Subcutaneous TID WC  . lithium carbonate  900 mg Oral QHS  . metFORMIN  1,000 mg Oral BID WC  . mometasone-formoterol  2 puff Inhalation BID  . nicotine  21 mg Transdermal Daily  . PARoxetine  25 mg Oral Daily  . simvastatin  40 mg Oral q1800  . traZODone  100 mg Oral QHS  . warfarin  7.5 mg Oral q1800    Assessment: Pharmacy to monitor warfarin for 40 yo male on chronic anticoagulation with history significant for DVT.   Date  INR Warfarin Dose 12/7 1.26 7.5mg  12/8 1.37 7.5mg  12/9 1.63 7.5mg   12/10   1.88 7.5mg   12/11    2.06 7.5mg  12/12   1.93     Goal of Therapy:  INR 2-3 Monitor platelets by anticoagulation protocol: Yes   Plan:  INR trended down slightly and is subtherapeutic. Will increase dose slightly to warfarin 8 mg PO for tonight  and obtain follow-up INR with am labs. CBC every 3 days.   Pharmacy will continue to monitor and adjust per consult.   Crist FatWang, Iveliz Garay L 05/26/2016,8:41 AM

## 2016-05-26 NOTE — Progress Notes (Signed)
Pt denies SI/HI/AVH. Pt is pleasant and cooperative, affect is flat and sad. Pt expressed he felt restless.. Patient was noted pacing on hallways, appears less anxious, minimal interaction with with peers and staff noted. .  A: Pt was offered support and encouragement. Pt was given scheduled medications. Pt was encouraged to attend groups. Q 15 minute checks were done for safety.  R:Pt attends groups and interacts well with peers and staff. Pt is taking medication. Pt receptive to treatment and safety maintained on unit.

## 2016-05-26 NOTE — Progress Notes (Signed)
Patient denies SI/HI, denies A/V hallucinations. Patient verbalizes understanding of discharge instructions, follow up care and prescriptions. Patient given all belongings from  locker. Patient escorted out by staff, transported by group home staff. 

## 2016-05-27 LAB — CLOZAPINE (CLOZARIL)
CLOZAPINE LVL: 239 ng/mL — AB (ref 350–650)
NORCLOZAPINE: 163 ng/mL
TOTAL(CLOZ+ NORCLOZ): 402 ng/mL

## 2016-05-29 LAB — GLUCOSE, CAPILLARY
GLUCOSE-CAPILLARY: 98 mg/dL (ref 65–99)
GLUCOSE-CAPILLARY: 108 mg/dL — AB (ref 65–99)
Glucose-Capillary: 127 mg/dL — ABNORMAL HIGH (ref 65–99)
Glucose-Capillary: 89 mg/dL (ref 65–99)

## 2016-07-06 DIAGNOSIS — F209 Schizophrenia, unspecified: Secondary | ICD-10-CM | POA: Diagnosis not present

## 2016-07-06 DIAGNOSIS — Z79899 Other long term (current) drug therapy: Secondary | ICD-10-CM | POA: Diagnosis not present

## 2016-07-27 ENCOUNTER — Ambulatory Visit: Payer: Medicare Other | Admitting: Podiatry

## 2016-07-30 ENCOUNTER — Ambulatory Visit: Payer: Medicare Other | Admitting: Podiatry

## 2016-08-12 DIAGNOSIS — F3131 Bipolar disorder, current episode depressed, mild: Secondary | ICD-10-CM | POA: Diagnosis not present

## 2016-08-12 DIAGNOSIS — Z8659 Personal history of other mental and behavioral disorders: Secondary | ICD-10-CM | POA: Diagnosis not present

## 2016-08-12 DIAGNOSIS — E784 Other hyperlipidemia: Secondary | ICD-10-CM | POA: Diagnosis not present

## 2016-08-12 DIAGNOSIS — I2782 Chronic pulmonary embolism: Secondary | ICD-10-CM | POA: Diagnosis not present

## 2016-08-31 ENCOUNTER — Inpatient Hospital Stay
Admission: RE | Admit: 2016-08-31 | Discharge: 2016-09-14 | DRG: 885 | Disposition: A | Discharge status: Home / Self Care | Payer: Medicare Other | Source: Intra-hospital | Attending: Psychiatry | Admitting: Psychiatry

## 2016-08-31 ENCOUNTER — Encounter: Payer: Self-pay | Admitting: Podiatry

## 2016-08-31 ENCOUNTER — Encounter: Payer: Self-pay | Admitting: *Deleted

## 2016-08-31 ENCOUNTER — Emergency Department
Admission: EM | Admit: 2016-08-31 | Discharge: 2016-08-31 | Disposition: A | Discharge status: Other Acute Inpatient Hospital | Payer: Medicare Other | Attending: Emergency Medicine | Admitting: Emergency Medicine

## 2016-08-31 ENCOUNTER — Ambulatory Visit (INDEPENDENT_AMBULATORY_CARE_PROVIDER_SITE_OTHER): Payer: Medicare Other | Admitting: Podiatry

## 2016-08-31 DIAGNOSIS — Z86711 Personal history of pulmonary embolism: Secondary | ICD-10-CM | POA: Diagnosis present

## 2016-08-31 DIAGNOSIS — J449 Chronic obstructive pulmonary disease, unspecified: Secondary | ICD-10-CM | POA: Diagnosis present

## 2016-08-31 DIAGNOSIS — R45851 Suicidal ideations: Secondary | ICD-10-CM | POA: Diagnosis present

## 2016-08-31 DIAGNOSIS — F17210 Nicotine dependence, cigarettes, uncomplicated: Secondary | ICD-10-CM | POA: Diagnosis present

## 2016-08-31 DIAGNOSIS — F419 Anxiety disorder, unspecified: Secondary | ICD-10-CM | POA: Diagnosis present

## 2016-08-31 DIAGNOSIS — E119 Type 2 diabetes mellitus without complications: Secondary | ICD-10-CM

## 2016-08-31 DIAGNOSIS — E785 Hyperlipidemia, unspecified: Secondary | ICD-10-CM | POA: Diagnosis present

## 2016-08-31 DIAGNOSIS — F515 Nightmare disorder: Secondary | ICD-10-CM | POA: Diagnosis not present

## 2016-08-31 DIAGNOSIS — F319 Bipolar disorder, unspecified: Secondary | ICD-10-CM | POA: Diagnosis present

## 2016-08-31 DIAGNOSIS — K5900 Constipation, unspecified: Secondary | ICD-10-CM | POA: Diagnosis present

## 2016-08-31 DIAGNOSIS — I10 Essential (primary) hypertension: Secondary | ICD-10-CM | POA: Diagnosis present

## 2016-08-31 DIAGNOSIS — F250 Schizoaffective disorder, bipolar type: Secondary | ICD-10-CM | POA: Diagnosis present

## 2016-08-31 DIAGNOSIS — R441 Visual hallucinations: Secondary | ICD-10-CM | POA: Insufficient documentation

## 2016-08-31 DIAGNOSIS — Z5181 Encounter for therapeutic drug level monitoring: Secondary | ICD-10-CM | POA: Diagnosis not present

## 2016-08-31 DIAGNOSIS — F259 Schizoaffective disorder, unspecified: Secondary | ICD-10-CM | POA: Diagnosis present

## 2016-08-31 DIAGNOSIS — Z79899 Other long term (current) drug therapy: Secondary | ICD-10-CM | POA: Diagnosis not present

## 2016-08-31 DIAGNOSIS — Z880 Allergy status to penicillin: Secondary | ICD-10-CM

## 2016-08-31 DIAGNOSIS — Z7984 Long term (current) use of oral hypoglycemic drugs: Secondary | ICD-10-CM | POA: Diagnosis not present

## 2016-08-31 DIAGNOSIS — Z7901 Long term (current) use of anticoagulants: Secondary | ICD-10-CM

## 2016-08-31 DIAGNOSIS — R4587 Impulsiveness: Secondary | ICD-10-CM | POA: Diagnosis present

## 2016-08-31 DIAGNOSIS — B351 Tinea unguium: Secondary | ICD-10-CM | POA: Diagnosis not present

## 2016-08-31 DIAGNOSIS — G4700 Insomnia, unspecified: Secondary | ICD-10-CM | POA: Diagnosis present

## 2016-08-31 DIAGNOSIS — K117 Disturbances of salivary secretion: Secondary | ICD-10-CM | POA: Diagnosis present

## 2016-08-31 DIAGNOSIS — R197 Diarrhea, unspecified: Secondary | ICD-10-CM | POA: Insufficient documentation

## 2016-08-31 DIAGNOSIS — F17200 Nicotine dependence, unspecified, uncomplicated: Secondary | ICD-10-CM

## 2016-08-31 DIAGNOSIS — M79676 Pain in unspecified toe(s): Secondary | ICD-10-CM

## 2016-08-31 DIAGNOSIS — R112 Nausea with vomiting, unspecified: Secondary | ICD-10-CM | POA: Diagnosis not present

## 2016-08-31 DIAGNOSIS — Q828 Other specified congenital malformations of skin: Secondary | ICD-10-CM | POA: Diagnosis not present

## 2016-08-31 HISTORY — DX: Other pulmonary embolism without acute cor pulmonale: I26.99

## 2016-08-31 LAB — COMPREHENSIVE METABOLIC PANEL
ALBUMIN: 4.9 g/dL (ref 3.5–5.0)
ALK PHOS: 55 U/L (ref 38–126)
ALT: 19 U/L (ref 17–63)
AST: 20 U/L (ref 15–41)
Anion gap: 10 (ref 5–15)
BILIRUBIN TOTAL: 1.2 mg/dL (ref 0.3–1.2)
BUN: 18 mg/dL (ref 6–20)
CALCIUM: 10.1 mg/dL (ref 8.9–10.3)
CO2: 27 mmol/L (ref 22–32)
Chloride: 104 mmol/L (ref 101–111)
Creatinine, Ser: 1.03 mg/dL (ref 0.61–1.24)
GFR calc Af Amer: >60 mL/min (ref >60)
GFR calc non Af Amer: >60 mL/min (ref >60)
GLUCOSE: 117 mg/dL — AB (ref 65–99)
POTASSIUM: 3.9 mmol/L (ref 3.5–5.1)
Sodium: 141 mmol/L (ref 135–145)
TOTAL PROTEIN: 7.7 g/dL (ref 6.5–8.1)

## 2016-08-31 LAB — CBC WITH DIFFERENTIAL/PLATELET
Basophils Absolute: 0 10*3/uL (ref 0–0.1)
Basophils Relative: 0 %
EOS PCT: 0 %
Eosinophils Absolute: 0 10*3/uL (ref 0–0.7)
HCT: 44 % (ref 40.0–52.0)
Hemoglobin: 15.2 g/dL (ref 13.0–18.0)
LYMPHS ABS: 1.1 10*3/uL (ref 1.0–3.6)
Lymphocytes Relative: 14 %
MCH: 31.2 pg (ref 26.0–34.0)
MCHC: 34.6 g/dL (ref 32.0–36.0)
MCV: 90.3 fL (ref 80.0–100.0)
MONOS PCT: 11 %
Monocytes Absolute: 0.9 10*3/uL (ref 0.2–1.0)
Neutro Abs: 6.3 10*3/uL (ref 1.4–6.5)
Neutrophils Relative %: 75 %
PLATELETS: 230 10*3/uL (ref 150–440)
RBC: 4.88 MIL/uL (ref 4.40–5.90)
RDW: 12.8 % (ref 11.5–14.5)
WBC: 8.3 10*3/uL (ref 3.8–10.6)

## 2016-08-31 LAB — CBC
HEMATOCRIT: 45.1 % (ref 40.0–52.0)
Hemoglobin: 16 g/dL (ref 13.0–18.0)
MCH: 32 pg (ref 26.0–34.0)
MCHC: 35.5 g/dL (ref 32.0–36.0)
MCV: 90.3 fL (ref 80.0–100.0)
PLATELETS: 280 10*3/uL (ref 150–440)
RBC: 5 MIL/uL (ref 4.40–5.90)
RDW: 12.8 % (ref 11.5–14.5)
WBC: 8.2 10*3/uL (ref 3.8–10.6)

## 2016-08-31 LAB — URINALYSIS, COMPLETE (UACMP) WITH MICROSCOPIC
Bilirubin Urine: NEGATIVE
Glucose, UA: NEGATIVE mg/dL
HGB URINE DIPSTICK: NEGATIVE
Ketones, ur: 20 mg/dL — AB
Leukocytes, UA: NEGATIVE
Nitrite: NEGATIVE
PROTEIN: 100 mg/dL — AB
SPECIFIC GRAVITY, URINE: 1.026 (ref 1.005–1.030)
pH: 6 (ref 5.0–8.0)

## 2016-08-31 LAB — URINE DRUG SCREEN, QUALITATIVE (ARMC ONLY)
Amphetamines, Ur Screen: NOT DETECTED
BARBITURATES, UR SCREEN: NOT DETECTED
Benzodiazepine, Ur Scrn: NOT DETECTED
CANNABINOID 50 NG, UR ~~LOC~~: NOT DETECTED
COCAINE METABOLITE, UR ~~LOC~~: NOT DETECTED
MDMA (ECSTASY) UR SCREEN: NOT DETECTED
METHADONE SCREEN, URINE: NOT DETECTED
Opiate, Ur Screen: NOT DETECTED
Phencyclidine (PCP) Ur S: NOT DETECTED
TRICYCLIC, UR SCREEN: NOT DETECTED

## 2016-08-31 LAB — ETHANOL

## 2016-08-31 LAB — LITHIUM LEVEL: LITHIUM LVL: 0.74 mmol/L (ref 0.60–1.20)

## 2016-08-31 LAB — PROTIME-INR
INR: 1.27
Prothrombin Time: 16 seconds — ABNORMAL HIGH (ref 11.4–15.2)

## 2016-08-31 LAB — LIPASE, BLOOD: Lipase: 15 U/L (ref 11–51)

## 2016-08-31 MED ORDER — MOMETASONE FURO-FORMOTEROL FUM 200-5 MCG/ACT IN AERO
2.0000 | INHALATION_SPRAY | Freq: Two times a day (BID) | RESPIRATORY_TRACT | Status: DC
  Administered 2016-09-01 – 2016-09-14 (×25): 2 via RESPIRATORY_TRACT
  Filled 2016-08-31: qty 8.8

## 2016-08-31 MED ORDER — WARFARIN SODIUM 4 MG PO TABS
8.0000 mg | ORAL_TABLET | Freq: Once | ORAL | Status: DC
  Administered 2016-08-31: 8 mg via ORAL
  Filled 2016-08-31: qty 2

## 2016-08-31 MED ORDER — PAROXETINE HCL ER 12.5 MG PO TB24
25.0000 mg | ORAL_TABLET | Freq: Every day | ORAL | Status: DC
  Administered 2016-08-31: 25 mg via ORAL
  Filled 2016-08-31: qty 1
  Filled 2016-08-31: qty 2

## 2016-08-31 MED ORDER — MAGNESIUM HYDROXIDE 400 MG/5ML PO SUSP
30.0000 mL | Freq: Every day | ORAL | Status: DC | PRN
Start: 2016-08-31 — End: 2016-09-14

## 2016-08-31 MED ORDER — WARFARIN SODIUM 4 MG PO TABS
8.0000 mg | ORAL_TABLET | Freq: Once | ORAL | Status: AC
  Administered 2016-09-01: 8 mg via ORAL
  Filled 2016-08-31: qty 2

## 2016-08-31 MED ORDER — CLOZAPINE 25 MG PO TABS
100.0000 mg | ORAL_TABLET | Freq: Every day | ORAL | Status: DC
  Administered 2016-09-01: 100 mg via ORAL
  Filled 2016-08-31: qty 4

## 2016-08-31 MED ORDER — SIMVASTATIN 10 MG PO TABS
40.0000 mg | ORAL_TABLET | Freq: Every day | ORAL | Status: DC
Start: 2016-08-31 — End: 2016-08-31
  Administered 2016-08-31: 40 mg via ORAL

## 2016-08-31 MED ORDER — CLOZAPINE 100 MG PO TABS: 300.0000 mg | ORAL_TABLET | Freq: Every day | ORAL | Status: DC

## 2016-08-31 MED ORDER — WARFARIN - PHYSICIAN DOSING INPATIENT
Freq: Every day | Status: DC
  Filled 2016-08-31 (×2): qty 1

## 2016-08-31 MED ORDER — ONDANSETRON 4 MG PO TBDP: 4.0000 mg | ORAL_TABLET | Freq: Three times a day (TID) | ORAL | 0 refills | Status: DC | PRN

## 2016-08-31 MED ORDER — ACETAMINOPHEN 325 MG PO TABS: 650.0000 mg | ORAL_TABLET | Freq: Four times a day (QID) | ORAL | Status: DC | PRN

## 2016-08-31 MED ORDER — LOPERAMIDE HCL 2 MG PO TABS: 2.0000 mg | ORAL_TABLET | Freq: Four times a day (QID) | ORAL | 0 refills | Status: DC | PRN

## 2016-08-31 MED ORDER — METFORMIN HCL 500 MG PO TABS
1000.0000 mg | ORAL_TABLET | Freq: Two times a day (BID) | ORAL | Status: DC
  Administered 2016-09-01: 1000 mg via ORAL
  Filled 2016-08-31: qty 2

## 2016-08-31 MED ORDER — LITHIUM CARBONATE ER 300 MG PO TBCR: 900.0000 mg | EXTENDED_RELEASE_TABLET | Freq: Every day | ORAL | Status: DC

## 2016-08-31 MED ORDER — MOMETASONE FURO-FORMOTEROL FUM 200-5 MCG/ACT IN AERO: 2.0000 | INHALATION_SPRAY | Freq: Two times a day (BID) | RESPIRATORY_TRACT | Status: DC

## 2016-08-31 MED ORDER — TRAZODONE HCL 100 MG PO TABS: 100.0000 mg | ORAL_TABLET | Freq: Every day | ORAL | Status: DC

## 2016-08-31 MED ORDER — PAROXETINE HCL ER 12.5 MG PO TB24
25.0000 mg | ORAL_TABLET | Freq: Every day | ORAL | Status: DC
  Filled 2016-08-31: qty 2

## 2016-08-31 MED ORDER — ASENAPINE MALEATE 5 MG SL SUBL
5.0000 mg | SUBLINGUAL_TABLET | Freq: Three times a day (TID) | SUBLINGUAL | Status: DC
  Administered 2016-08-31: 5 mg via SUBLINGUAL
  Filled 2016-08-31 (×2): qty 1

## 2016-08-31 MED ORDER — INSULIN ASPART 100 UNIT/ML ~~LOC~~ SOLN
0.0000 [IU] | Freq: Three times a day (TID) | SUBCUTANEOUS | Status: DC
  Administered 2016-09-01 – 2016-09-02 (×3): 2 [IU] via SUBCUTANEOUS
  Administered 2016-09-02: 3 [IU] via SUBCUTANEOUS
  Filled 2016-08-31 (×2): qty 2

## 2016-08-31 MED ORDER — CLOZAPINE 25 MG PO TABS
ORAL_TABLET | ORAL | Status: AC
  Filled 2016-08-31: qty 4

## 2016-08-31 MED ORDER — ALUM & MAG HYDROXIDE-SIMETH 200-200-20 MG/5ML PO SUSP
30.0000 mL | ORAL | Status: DC | PRN
Start: 2016-08-31 — End: 2016-09-14

## 2016-08-31 MED ORDER — SODIUM CHLORIDE 0.9 % IV BOLUS (SEPSIS)
1000.0000 mL | Freq: Once | INTRAVENOUS | Status: AC
  Administered 2016-08-31: 1000 mL via INTRAVENOUS

## 2016-08-31 MED ORDER — ASENAPINE MALEATE 5 MG SL SUBL
5.0000 mg | SUBLINGUAL_TABLET | Freq: Three times a day (TID) | SUBLINGUAL | Status: DC
  Administered 2016-09-01: 5 mg via SUBLINGUAL
  Filled 2016-08-31: qty 1

## 2016-08-31 MED ORDER — ONDANSETRON HCL 4 MG/2ML IJ SOLN
4.0000 mg | Freq: Once | INTRAMUSCULAR | Status: AC
  Administered 2016-08-31: 4 mg via INTRAVENOUS
  Filled 2016-08-31: qty 2

## 2016-08-31 MED ORDER — CLOZAPINE 25 MG PO TABS
100.0000 mg | ORAL_TABLET | Freq: Every day | ORAL | Status: DC
  Administered 2016-08-31: 100 mg via ORAL

## 2016-08-31 MED ORDER — METFORMIN HCL 500 MG PO TABS
ORAL_TABLET | ORAL | Status: AC
  Filled 2016-08-31: qty 2

## 2016-08-31 MED ORDER — LOPERAMIDE HCL 2 MG PO CAPS
4.0000 mg | ORAL_CAPSULE | Freq: Once | ORAL | Status: AC
  Administered 2016-08-31: 4 mg via ORAL
  Filled 2016-08-31: qty 2

## 2016-08-31 MED ORDER — TRAZODONE HCL 100 MG PO TABS: 100.0000 mg | ORAL_TABLET | Freq: Every evening | ORAL | Status: DC | PRN

## 2016-08-31 MED ORDER — SIMVASTATIN 40 MG PO TABS
40.0000 mg | ORAL_TABLET | Freq: Every day | ORAL | Status: DC
  Administered 2016-09-01 – 2016-09-03 (×3): 40 mg via ORAL
  Filled 2016-08-31 (×3): qty 1

## 2016-08-31 MED ORDER — METFORMIN HCL 500 MG PO TABS
1000.0000 mg | ORAL_TABLET | Freq: Two times a day (BID) | ORAL | Status: DC
  Administered 2016-08-31: 1000 mg via ORAL

## 2016-08-31 MED ORDER — SIMVASTATIN 10 MG PO TABS
ORAL_TABLET | ORAL | Status: AC
  Filled 2016-08-31: qty 4

## 2016-08-31 MED ORDER — ONDANSETRON 4 MG PO TBDP
4.0000 mg | ORAL_TABLET | Freq: Once | ORAL | Status: AC
  Administered 2016-08-31: 4 mg via ORAL
  Filled 2016-08-31: qty 1

## 2016-08-31 MED ORDER — INSULIN ASPART 100 UNIT/ML ~~LOC~~ SOLN: 0.0000 [IU] | Freq: Three times a day (TID) | SUBCUTANEOUS | Status: DC

## 2016-08-31 NOTE — ED Notes (Signed)
Pt told Dr. Sharma Lance [name] he was having hallucinations and asking to speak to a psychiatrist. Pt informed that the doctor has put in the order for him to talk to the psychiatrist, also informed this Rn does not know when he will be by to talk to pt. Resting on stretcher, sides rails up.

## 2016-08-31 NOTE — ED Notes (Signed)
Pt ambulatory to and from bathroom with steady gait and no assistance.

## 2016-08-31 NOTE — Consult Note (Signed)
[facility] Consult   Reason for Consult:  Consult for 41 year old man with a history of schizoaffective disorder Referring Physician:  Mariea Clonts Patient Identification: Lance [name] MRN:  [id number] Principal Diagnosis: Schizoaffective disorder, bipolar type [facility]) Diagnosis:   Patient Active Problem List   Diagnosis Date Noted  . Schizoaffective disorder, bipolar type ([place]) [F25.0] 05/22/2016  . Diabetes mellitus without complication ([place]) [N82.9] 05/21/2016  . Hyperlipidemia [E78.5] 05/21/2016  . History of pulmonary embolism [Z86.711] 05/21/2016  . Chronic anticoagulation [Z79.01] 05/21/2016  . Hypertension [I10] 09/25/2015  . Suicidal ideation [R45.851] 09/25/2015    Total Time spent with patient: 1 hour  Subjective:   Lance [name] is a 41 y.o. male patient admitted with "my mind is not right".  HPI:  Patient interviewed. Chart reviewed. Patient known from previous encounters. Spoke with emergency room physician. 41 year old man came to the emergency room initially with what appeared to be physical complaints saying that he had been sick to his stomach 4 days. Once he got and he was asking to speak to a psychiatrist. Patient is known to me from previous encounters. He immediately began telling me that his mind is not right and that his thoughts feel out of control. He says he is thinking constantly about [name] about the market the beast about going to hell. He admits that all of these things of been chronic worries for him but says they're getting much worse. He has trouble sleeping at night. He feels stressed out and anxious all the time. Denies any specific suicidal intent but feels hopeless and at times has passive suicidal thoughts. Denies homicidal ideation but says that if things get worse he is worried that he might explode. He says he has been compliant with all of his medicine. He denies knowing of any particular psychosocial stress that would be causing this.  Patient denied to me that he is been using any drugs recently.  1 patient has diabetes usually controlled with oral medicine but at times requiring insulin and also has COPD dyslipidemia and high blood pressure.  Social history: He has a guardian. Patient resides in a group home. Has minimal contact outside of it. Pretty institutionalized.  Substance abuse history: Past history of use of marijuana and at times other drugs which obviously complicated his problems. Claims that he is not using anything recently.  Past Psychiatric History: Multiple prior hospitalizations. Positive past history of suicidality and aggression especially when off medicine. Has schizoaffective disorder and has been both manic and depressed at times although psychotic symptoms appear to be chronic and very disturbing. It has been several months since he was here in the hospital.  Risk to Self:   Risk to Others:   Prior Inpatient Therapy:   Prior Outpatient Therapy:    Past Medical History:  Past Medical History:  Diagnosis Date  . Depression   . Diabetes mellitus without complication ([facility])   . Hyperlipidemia   . Hypertension   . PE (pulmonary thromboembolism) ([place])   . Schizo affective schizophrenia ([place])    History reviewed. No pertinent surgical history. Family History:  Family History  Problem Relation Age of Onset  . CAD Mother   . CAD Sister    Family Psychiatric  History: Denies being aware of any Social History:  History  Alcohol Use No    Comment: occassionally     History  Drug Use No    Social History   Social History  . Marital status: Single  Spouse name: N/A  . Number of children: N/A  . Years of education: N/A   Social History Main Topics  . Smoking status: Current Every Day Smoker    Packs/day: 1.00    Years: 23.00    Types: Cigarettes  . Smokeless tobacco: Never Used  . Alcohol use No     Comment: occassionally  . Drug use: No  . Sexual activity: No     Comment:  occasional marijuana- none recently   Other Topics Concern  . None   Social History Narrative   From a group home in [place]   Additional Social History:    Allergies:   Allergies  Allergen Reactions  . Penicillins     Labs:  Results for orders placed or performed during the hospital encounter of 08/31/16 (from the past 48 hour(s))  Lipase, blood     Status: None   Collection Time: 08/31/16 10:12 AM  Result Value Ref Range   Lipase 15 11 - 51 U/L  Comprehensive metabolic panel     Status: Abnormal   Collection Time: 08/31/16 10:12 AM  Result Value Ref Range   Sodium 141 135 - 145 mmol/L   Potassium 3.9 3.5 - 5.1 mmol/L   Chloride 104 101 - 111 mmol/L   CO2 27 22 - 32 mmol/L   Glucose, Bld 117 (H) 65 - 99 mg/dL   BUN 18 6 - 20 mg/dL   Creatinine, Ser 1.03 0.61 - 1.24 mg/dL   Calcium 10.1 8.9 - 10.3 mg/dL   Total Protein 7.7 6.5 - 8.1 g/dL   Albumin 4.9 3.5 - 5.0 g/dL   AST 20 15 - 41 U/L   ALT 19 17 - 63 U/L   Alkaline Phosphatase 55 38 - 126 U/L   Total Bilirubin 1.2 0.3 - 1.2 mg/dL   GFR calc non Af Amer >60 >60 mL/min   GFR calc Af Amer >60 >60 mL/min    Comment: (NOTE) The eGFR has been calculated using the CKD EPI equation. This calculation has not been validated in all clinical situations. eGFR's persistently <60 mL/min signify possible Chronic Kidney Disease.    Anion gap 10 5 - 15  CBC     Status: None   Collection Time: 08/31/16 10:12 AM  Result Value Ref Range   WBC 8.2 3.8 - 10.6 K/uL   RBC 5.00 4.40 - 5.90 MIL/uL   Hemoglobin 16.0 13.0 - 18.0 g/dL   HCT 45.1 40.0 - 52.0 %   MCV 90.3 80.0 - 100.0 fL   MCH 32.0 26.0 - 34.0 pg   MCHC 35.5 32.0 - 36.0 g/dL   RDW 12.8 11.5 - 14.5 %   Platelets 280 150 - 440 K/uL  Urinalysis, Complete w Microscopic     Status: Abnormal   Collection Time: 08/31/16 10:12 AM  Result Value Ref Range   Color, Urine AMBER (A) YELLOW    Comment: BIOCHEMICALS MAY BE AFFECTED BY COLOR   APPearance CLEAR (A) CLEAR    Specific Gravity, Urine 1.026 1.005 - 1.030   pH 6.0 5.0 - 8.0   Glucose, UA NEGATIVE NEGATIVE mg/dL   Hgb urine dipstick NEGATIVE NEGATIVE   Bilirubin Urine NEGATIVE NEGATIVE   Ketones, ur 20 (A) NEGATIVE mg/dL   Protein, ur 100 (A) NEGATIVE mg/dL   Nitrite NEGATIVE NEGATIVE   Leukocytes, UA NEGATIVE NEGATIVE   RBC / HPF 0-5 0 - 5 RBC/hpf   WBC, UA 0-5 0 - 5 WBC/hpf   Bacteria, UA RARE (  A) NONE SEEN   Squamous Epithelial / LPF 0-5 (A) NONE SEEN   Mucous PRESENT    Hyaline Casts, UA PRESENT   Urine Drug Screen, Qualitative ([facility] only)     Status: None   Collection Time: 08/31/16 10:12 AM  Result Value Ref Range   Tricyclic, Ur Screen NONE DETECTED NONE DETECTED   Amphetamines, Ur Screen NONE DETECTED NONE DETECTED   MDMA (Ecstasy)Ur Screen NONE DETECTED NONE DETECTED   Cocaine Metabolite,Ur [place] NONE DETECTED NONE DETECTED   Opiate, Ur Screen NONE DETECTED NONE DETECTED   Phencyclidine (PCP) Ur S NONE DETECTED NONE DETECTED   Cannabinoid 50 Ng, Ur  NONE DETECTED NONE DETECTED   Barbiturates, Ur Screen NONE DETECTED NONE DETECTED   Benzodiazepine, Ur Scrn NONE DETECTED NONE DETECTED   Methadone Scn, Ur NONE DETECTED NONE DETECTED    Comment: (NOTE) 419  Tricyclics, urine               Cutoff 1000 ng/mL 200  Amphetamines, urine             Cutoff 1000 ng/mL 300  MDMA (Ecstasy), urine           Cutoff 500 ng/mL 400  Cocaine Metabolite, urine       Cutoff 300 ng/mL 500  Opiate, urine                   Cutoff 300 ng/mL 600  Phencyclidine (PCP), urine      Cutoff 25 ng/mL 700  Cannabinoid, urine              Cutoff 50 ng/mL 800  Barbiturates, urine             Cutoff 200 ng/mL 900  Benzodiazepine, urine           Cutoff 200 ng/mL 1000 Methadone, urine                Cutoff 300 ng/mL 1100 1200 The urine drug screen provides only a preliminary, unconfirmed 1300 analytical test result and should not be used for non-medical 1400 purposes. Clinical consideration and professional  judgment should 1500 be applied to any positive drug screen result due to possible 1600 interfering substances. A more specific alternate chemical method 1700 must be used in order to obtain a confirmed analytical result.  1800 Gas chromato graphy / mass spectrometry (GC/MS) is the preferred 1900 confirmatory method.   Ethanol     Status: None   Collection Time: 08/31/16 12:25 PM  Result Value Ref Range   Alcohol, Ethyl (B) <5 <5 mg/dL    Comment:        LOWEST DETECTABLE LIMIT FOR SERUM ALCOHOL IS 5 mg/dL FOR MEDICAL PURPOSES ONLY   Lithium level     Status: None   Collection Time: 08/31/16 12:25 PM  Result Value Ref Range   Lithium Lvl 0.74 0.60 - 1.20 mmol/L    Current Facility-Administered Medications  Medication Dose Route Frequency Provider Last Rate Last Dose  . asenapine (SAPHRIS) sublingual tablet 5 mg  5 mg Sublingual TID Gonzella Lex, MD      . cloZAPine (CLOZARIL) tablet 100 mg  100 mg Oral Daily Gonzella Lex, MD      . cloZAPine (CLOZARIL) tablet 300 mg  300 mg Oral QHS Gonzella Lex, MD      . Derrill Memo ON 09/01/2016] insulin aspart (novoLOG) injection 0-15 Units  0-15 Units Subcutaneous TID WC Gonzella Lex, MD      .  lithium carbonate (LITHOBID) CR tablet 900 mg  900 mg Oral QHS Lady Wisham T Viviane Semidey, MD      . metFORMIN (GLUCOPHAGE) tablet 1,000 mg  1,000 mg Oral BID WC Gonzella Lex, MD      . mometasone-formoterol (DULERA) 200-5 MCG/ACT inhaler 2 puff  2 puff Inhalation BID Gonzella Lex, MD      . PARoxetine (PAXIL-CR) 24 hr tablet 25 mg  25 mg Oral Daily Gonzella Lex, MD      . simvastatin (ZOCOR) tablet 40 mg  40 mg Oral q1800 Gonzella Lex, MD      . traZODone (DESYREL) tablet 100 mg  100 mg Oral QHS Gonzella Lex, MD      . warfarin (COUMADIN) tablet 8 mg  8 mg Oral ONCE-1800 Gonzella Lex, MD       Current Outpatient Prescriptions  Medication Sig Dispense Refill  . asenapine (SAPHRIS) 5 MG SUBL 24 hr tablet Place 1 tablet (5 mg total) under the tongue  3 (three) times daily. 90 tablet 1  . cloZAPine (CLOZARIL) 100 MG tablet Take 100-300 mg by mouth 2 (two) times daily. 154m in the morning and 3022mat bedtime    . Fluticasone-Salmeterol (ADVAIR) 250-50 MCG/DOSE AEPB Inhale 1 puff into the lungs 2 (two) times daily.    . Marland Kitchenetoconazole (NIZORAL) 2 % cream Apply 1 application topically daily. 60 g 2  . lithium carbonate (ESKALITH) 450 MG CR tablet Take 2 tablets (900 mg total) by mouth at bedtime. 60 tablet 1  . loperamide (IMODIUM A-D) 2 MG tablet Take 1 tablet (2 mg total) by mouth 4 (four) times daily as needed for diarrhea or loose stools. 12 tablet 0  . metFORMIN (GLUCOPHAGE) 1000 MG tablet Take 1,000 mg by mouth 2 (two) times daily with a meal.    . omega-3 acid ethyl esters (LOVAZA) 1 g capsule Take 1 g by mouth daily.    . ondansetron (ZOFRAN ODT) 4 MG disintegrating tablet Take 1 tablet (4 mg total) by mouth every 8 (eight) hours as needed for nausea or vomiting. 20 tablet 0  . PARoxetine (PAXIL-CR) 25 MG 24 hr tablet Take 25 mg by mouth daily.    . simvastatin (ZOCOR) 40 MG tablet Take 40 mg by mouth at bedtime.    . traZODone (DESYREL) 100 MG tablet Take 100 mg by mouth at bedtime.    . Marland Kitchenarfarin (COUMADIN) 10 MG tablet Take 10 mg by mouth every other day.    . warfarin (COUMADIN) 5 MG tablet Take 5 mg by mouth every other day.      Musculoskeletal: Strength & Muscle Tone: within normal limits Gait & Station: normal Patient leans: N/A  Psychiatric Specialty Exam: Physical Exam  Nursing note and vitals reviewed. Constitutional: He appears well-developed and well-nourished.  HENT:  Head: Normocephalic and atraumatic.  Eyes: Conjunctivae are normal. Pupils are equal, round, and reactive to light.  Neck: Normal range of motion.  Cardiovascular: Regular rhythm and normal heart sounds.   Respiratory: Effort normal. No respiratory distress.  GI: Soft.  Musculoskeletal: Normal range of motion.  Neurological: He is alert.  Skin:  Skin is warm and dry.  Psychiatric: His mood appears anxious. His speech is delayed and tangential. He is actively hallucinating. Thought content is paranoid and delusional. Cognition and memory are normal. He expresses impulsivity. He exhibits a depressed mood. He expresses suicidal ideation. He expresses no homicidal ideation. He is inattentive.    Review of Systems  Constitutional: Negative.   HENT: Negative.   Eyes: Negative.   Respiratory: Negative.   Cardiovascular: Negative.   Gastrointestinal: Positive for nausea. Negative for vomiting.  Musculoskeletal: Negative.   Skin: Negative.   Neurological: Negative.   Psychiatric/Behavioral: Positive for depression, hallucinations, memory loss and suicidal ideas. Negative for substance abuse. The patient is nervous/anxious and has insomnia.     Blood pressure 119/73, pulse (!) 58, temperature 98 F (36.7 C), temperature source Oral, resp. rate 16, height 5' 10.5" (1.791 m), weight 95.3 kg (210 lb), SpO2 96 %.Body mass index is 29.71 kg/m.  General Appearance: Disheveled  Eye Contact:  Fair  Speech:  Slow  Volume:  Decreased  Mood:  Anxious, Depressed and Dysphoric  Affect:  Constricted  Thought Process:  Disorganized  Orientation:  Full (Time, Place, and Person)  Thought Content:  Illogical, Delusions, Hallucinations: Auditory Visual, Ideas of Reference:   Paranoia and Paranoid Ideation  Suicidal Thoughts:  Yes.  without intent/plan  Homicidal Thoughts:  No  Memory:  Immediate;   Fair Recent;   Fair Remote;   Fair  Judgement:  Fair  Insight:  Fair  Psychomotor Activity:  Decreased  Concentration:  Concentration: Fair  Recall:  [employer] of Knowledge:  Fair  Language:  Fair  Akathisia:  No  Handed:  Right  AIMS (if indicated):     Assets:  Desire for Improvement Housing Social Support  ADL's:  Intact  Cognition:  Impaired,  Mild  Sleep:        Treatment Plan Summary: Daily contact with patient to assess and  evaluate symptoms and progress in treatment, Medication management and Plan Patient was medically cleared. Lithium level was checked at my suggestion and is only 0.7. He hasn't actually been throwing up. In the emergency room. He seems to morbid overwhelmed by his psychotic thinking despite taking his clozapine regularly. Patient will be admitted to the psychiatric unit. Continue current outpatient medicines as well as when necessary medicine. 15 minute checks. Full set of labs. Case reviewed with the ER physician and TTS.  Disposition: Recommend psychiatric Inpatient admission when medically cleared. Supportive therapy provided about ongoing stressors.  Alethia Berthold, MD 08/31/2016 5:16 PM

## 2016-08-31 NOTE — ED Notes (Signed)
Pt vomiting in hallway

## 2016-08-31 NOTE — BH Assessment (Signed)
Per Dr. Toni Amendlapacs - [facility] and Dr. Ardyth HarpsHernandez is the accepting physician.  Writer attempted to inform the ER RN of the disposition.  The patient can be transferred after 7pm.

## 2016-08-31 NOTE — ED Notes (Signed)
Pt given ginger ale to drink. 

## 2016-08-31 NOTE — ED Triage Notes (Signed)
States vomiting and diarrhea that began yesterday, denies any pain, awake and alert, hx of DM with CBG 132, from [facility]

## 2016-08-31 NOTE — ED Provider Notes (Signed)
[facility] Emergency Department Provider Note  ____________________________________________  Time seen: Approximately 12:17 PM  I have reviewed the triage vital signs and the nursing notes.   HISTORY  Chief Complaint Emesis    HPI Lance [name] is a 41 y.o. male from a group home with a history of schizoaffective schizophrenia, depression and DM presenting with nausea vomiting and diarrhea, as well as hallucinations and nightmares. The patient reports that since yesterday, he has had copious because of vomiting and 3 episodes of nonbloody loose stool. He has not had any associated abdominal pain, fever or chills, or urinary symptoms. He also reports that he is having visual hallucinations "seeing shapes in the air" and states it is not normal for his schizophrenia. In addition he has been having "really scary nightmares." He cannot remember the name of the psychiatrist cc's an outpatient, and would like to see a psychiatrist here. He denies any SI, or HI. Recent changes in his medications. No known sick contacts at his group home or otherwise. No cough or cold symptoms.   Past Medical History:  Diagnosis Date  . Depression   . Diabetes mellitus without complication ([facility])   . Hyperlipidemia   . Hypertension   . PE (pulmonary thromboembolism) ([facility])   . Schizo affective schizophrenia [facility])     Patient Active Problem List   Diagnosis Date Noted  . Schizoaffective disorder, bipolar type ([facility]) 05/22/2016  . Diabetes mellitus without complication ([facility]) 05/21/2016  . Hyperlipidemia 05/21/2016  . History of pulmonary embolism 05/21/2016  . Chronic anticoagulation 05/21/2016  . Hypertension 09/25/2015  . Suicidal ideation 09/25/2015    History reviewed. No pertinent surgical history.  Current Outpatient Rx  . Order #: [id number] Class: Print  . Order #: [id number] Class: Historical Med  . Order #: [id number] Class: Historical Med  . Order #: [id number] Class: Print   . Order #: [id number] Class: Print  . Order #: [id number] Class: Historical Med  . Order #: [id number] Class: Historical Med  . Order #: [id number] Class: Historical Med  . Order #: [id number] Class: Historical Med  . Order #: [id number] Class: Historical Med  . Order #: [id number] Class: Historical Med  . Order #: [id number] Class: Historical Med    Allergies Penicillins  Family History  Problem Relation Age of Onset  . CAD Mother   . CAD Sister     Social History Social History  Substance Use Topics  . Smoking status: Current Every Day Smoker    Packs/day: 1.00    Years: 23.00    Types: Cigarettes  . Smokeless tobacco: Never Used  . Alcohol use No     Comment: occassionally    Review of Systems Constitutional: No fever/chills.Lightheadedness or syncope. No myalgias. Eyes: No visual changes. ENT: No sore throat. No congestion or rhinorrhea. Cardiovascular: Denies chest pain. Denies palpitations. Respiratory: Denies shortness of breath.  No cough. Gastrointestinal: No abdominal pain.  Positive nausea, positive vomiting.  Positive diarrhea.  No constipation. Genitourinary: Negative for dysuria. Musculoskeletal: Negative for back pain. Skin: Negative for rash. Neurological: Negative for headaches. No focal numbness, tingling or weakness.  Psychiatric:Positive visual hallucinations. Negative auditory hallucinations. No SI or HI.  10-point ROS otherwise negative.  ____________________________________________   PHYSICAL EXAM:  VITAL SIGNS: ED Triage Vitals  Enc Vitals Group     BP 08/31/16 0952 124/73     Pulse Rate 08/31/16 0952 (!) 59     Resp 08/31/16 0952 20     Temp 08/31/16 0952 98 F (36.7 C)     Temp Source  08/31/16 0952 Oral     SpO2 08/31/16 0952 96 %     Weight 08/31/16 0953 210 lb (95.3 kg)     Height 08/31/16 0953 5' 10.5" (1.791 m)     Head Circumference --      Peak Flow --      Pain Score --      Pain Loc --      Pain Edu? --      Excl. in GC? --      Constitutional: Alert and oriented. Well appearing and in no acute distress. Answers questions appropriately. Eyes: Conjunctivae are normal.  EOMI. No scleral icterus. Head: Atraumatic. Nose: No congestion/rhinnorhea. Mouth/Throat: Mucous membranes are moist.  Neck: No stridor.  Supple.  No meningismus. Cardiovascular: Normal rate, regular rhythm. No murmurs, rubs or gallops.  Respiratory: Normal respiratory effort.  No accessory muscle use or retractions. Lungs CTAB.  No wheezes, rales or ronchi. Gastrointestinal: Soft, nontender and nondistended.  No guarding or rebound.  No peritoneal signs. Musculoskeletal: No LE edema.  Neurologic:  A&Ox3.  Speech is clear.  Face and smile are symmetric.  EOMI.  Moves all extremities well. Skin:  Skin is warm, dry and intact. No rash noted. Psychiatric: Flat affect. Pressured speech. No evidence of acute psychosis or paranoia.   ____________________________________________   LABS (all labs ordered are listed, but only abnormal results are displayed)  Labs Reviewed  COMPREHENSIVE METABOLIC PANEL - Abnormal; Notable for the following:       Result Value   Glucose, Bld 117 (*)    All other components within normal limits  URINALYSIS, COMPLETE (UACMP) WITH MICROSCOPIC - Abnormal; Notable for the following:    Color, Urine AMBER (*)    APPearance CLEAR (*)    Ketones, ur 20 (*)    Protein, ur 100 (*)    Bacteria, UA RARE (*)    Squamous Epithelial / LPF 0-5 (*)    All other components within normal limits  LIPASE, BLOOD  CBC  URINE DRUG SCREEN, QUALITATIVE ([facility] ONLY)  ETHANOL   ____________________________________________  EKG  Not indicated ____________________________________________  RADIOLOGY  No results found.  ____________________________________________   PROCEDURES  Procedure(s) performed: None  Procedures  Critical Care performed: No ____________________________________________   INITIAL IMPRESSION /  ASSESSMENT AND PLAN / ED COURSE  Pertinent labs & imaging results that were available during my care of the patient were reviewed by me and considered in my medical decision making (see chart for details).  41 y.o. male with multiple episodes of nausea vomiting and diarrhea without abdominal pain, fever or chills, as well as visual hallucinations and scary nightmares. Overall, he has hemodynamically stable and has no clinical exam findings on his abdominal examination I would be consistent with an acute surgical pathology. His possible that he has a foodborne or other viral GI illness. It is very likely that he has C. difficile for an acute diarrheal illness as the patient is having more vomiting that he is having diarrhea. No further imaging is indicated at this time, but I will treat him symptomatically and make sure that he is able to keep down clear liquids. In terms of his psychiatric complaints, I have put in a consult to TTS and to psychiatry.  ----------------------------------------- 3:12 PM on 08/31/2016 ----------------------------------------- The patient's medical workup is complete. His laboratory studies are reassuring, he has no evidence of lithium overdose as a cause for symptoms, and he is tolerating liquids without any difficulty. I'm awaiting psychiatric disposition.  ____________________________________________  FINAL CLINICAL IMPRESSION(S) / ED DIAGNOSES  Final diagnoses:  Nausea vomiting and diarrhea  Visual hallucinations  Nightmares         NEW MEDICATIONS STARTED DURING THIS VISIT:  New Prescriptions   No medications on file      Rockne MenghiniAnne-Caroline Ryenne Lynam, MD 08/31/16 1512

## 2016-08-31 NOTE — Discharge Instructions (Signed)
Please take a clear liquid diet for the next 24 hours, then advance to bland diet as tolerated. You may take Zofran for nausea and loperamide for diarrhea.  Return to the emergency department for severe pain, inability to keep down fluids, lightheadedness or fainting, fever, abdominal pain, or any other symptoms concerning to you.

## 2016-08-31 NOTE — ED Notes (Signed)
Group home [facility] [phone]

## 2016-08-31 NOTE — Progress Notes (Signed)
Complaint:  Visit Type: Patient returns to my office for continued preventative foot care services. Complaint: Patient states" my nails have grown long and thick and become painful to walk and wear shoes" Patient has been diagnosed with DM with no foot complications. The patient presents for preventative foot care services. No changes to ROS.  Patient also says he has painful callus.  Podiatric Exam: Vascular: dorsalis pedis and posterior tibial pulses are palpable bilateral. Capillary return is immediate. Temperature gradient is WNL. Skin turgor WNL  Sensorium: Normal Semmes Weinstein monofilament test. Normal tactile sensation bilaterally. Nail Exam: Pt has thick disfigured discolored nails with subungual debris noted bilateral entire nail second toenail  through fifth toenails Ulcer Exam: There is no evidence of ulcer or pre-ulcerative changes or infection. Orthopedic Exam: Muscle tone and strength are WNL. No limitations in general ROM. No crepitus or effusions noted. Foot type and digits show no abnormalities. Bony prominences are unremarkable. Skin: No Porokeratosis. No infection or ulcers.  Pinch callus.  Sub 4 porokeratosis right foot.  Diagnosis:  Onychomycosis, , Pain in right toe, pain in left toes  Treatment & Plan Procedures and Treatment: Consent by patient was obtained for treatment procedures. The patient understood the discussion of treatment and procedures well. All questions were answered thoroughly reviewed. Debridement of mycotic and hypertrophic toenails, 1 through 5 bilateral and clearing of subungual debris. No ulceration, no infection noted. Debride callus. Return Visit-Office Procedure: Patient instructed to return to the office for a follow up visit 3 months for continued evaluation and treatment.    Brynna Dobos DPM  

## 2016-08-31 NOTE — ED Notes (Signed)
Pt given ginger ale.

## 2016-08-31 NOTE — ED Notes (Signed)
Pt denies SI, only states HI when he becomes frustrated and "wants to lash out." States he does not feel HI at current. Pt states he is from group home called [facility] on [address]. Pt appears depressed, states he has been having visual hallucinations of people x few months but they don't tell him anything or tell him to do anything. States "if they talked to me that would be a trip." Pt calm and cooperative, alert, oriented, ambulatory by self.

## 2016-08-31 NOTE — ED Notes (Signed)
Pt denies vomiting or diarrhea since being in ED. States he still feels nauseous from coughing.

## 2016-08-31 NOTE — ED Notes (Signed)
Pt given meal tray.

## 2016-08-31 NOTE — ED Notes (Addendum)
Dr.Clapacs at bedside  

## 2016-08-31 NOTE — ED Provider Notes (Signed)
Clinical Course as of Aug 31 1632  Mon Aug 31, 2016  1527 Assuming care from Dr. Sharma CovertNorman.  In short, Lance [name] is a 41 y.o. male with a chief complaint of N/V/D and worsening visual hallucinations.  Refer to the original H&P for additional details.  The current plan of care is to follow up psych recommendations.   [CF]  1630 Spoke with Dr. Toni Amendlapacs who plans to admit this patient to Behavioral.  [CF]    Clinical Course User Index [CF] Loleta Roseory Mylz Yuan, MD      Loleta Roseory Drewey Begue, MD 08/31/16 [phone]

## 2016-08-31 NOTE — ED Notes (Signed)
Pt states he has thrown up twice in ED. Will ask for something for nausea.

## 2016-08-31 NOTE — ED Notes (Signed)
Pt sitting up on side of bed 

## 2016-08-31 NOTE — ED Notes (Signed)
Pt states vomiting and diarrhea x 2 days. Denies belly, chest pain. Denies fever. Pt alert and oriented. Pt states not being able to eat anything since feeling sick. Pt states 30 times of vomiting and 3 times of diarrhea in last 2 days. Pt also concerned about a bump under L ear that he thinks is a bug bite.

## 2016-08-31 NOTE — BH Assessment (Signed)
Assessment Note  Lance [name] is a 41 year old white male that was uncooperative during the assessment.  Patient is a poor historian.  Patient would only answer question by nodding yes or no.   Per documentation in the epic chart the patient is from a group home with a history of schizoaffective schizophrenia, depression and DM presenting with nausea vomiting and diarrhea, as well as hallucinations and nightmares.   Per documentation in the epic chart the patient reports that he is having visual hallucinations "seeing shapes in the air" and states it is not normal for his schizophrenia. In addition he has been having "really scary nightmares." He cannot remember the name of the psychiatrist cc's an outpatient, and would like to see a psychiatrist here.   Per documentation in the epic chart the patient reports that he immediately began telling me that his mind is not right and that his thoughts feel out of control. He says he is thinking constantly about Prudy Feeler about the [employer] about going to hell. He admits that all of these things of been chronic worries for him but says they're getting much worse.   Diagnosis: Schizoaffective Disorder   Past Medical History:  Past Medical History:  Diagnosis Date  . Depression   . Diabetes mellitus without complication (HCC)   . Hyperlipidemia   . Hypertension   . PE (pulmonary thromboembolism) (HCC)   . Schizo affective schizophrenia (HCC)     History reviewed. No pertinent surgical history.  Family History:  Family History  Problem Relation Age of Onset  . CAD Mother   . CAD Sister     Social History:  reports that he has been smoking Cigarettes.  He has a 23.00 pack-year smoking history. He has never used smokeless tobacco. He reports that he does not drink alcohol or use drugs.  Additional Social History:  Alcohol / Drug Use History of alcohol / drug use?: No history of alcohol / drug abuse  CIWA: CIWA-Ar BP: 119/73 Pulse Rate:  (!) 58 COWS:    Allergies:  Allergies  Allergen Reactions  . Penicillins     Home Medications:  (Not in a hospital admission)  OB/GYN Status:  No LMP for male patient.  General Assessment Data Location of Assessment: [facility] ED TTS Assessment: In system Is this a Tele or Face-to-Face Assessment?: Tele Assessment Is this an Initial Assessment or a Re-assessment for this encounter?: Initial Assessment Marital status: Single Maiden name: NA Is patient pregnant?: No Pregnancy Status: No Living Arrangements: Group Home Can pt return to current living arrangement?: Yes Admission Status: Voluntary Is patient capable of signing voluntary admission?: Yes Referral Source: Self/Family/Friend Insurance type: Medicaid   Medical Screening Exam [facility] Walk-in ONLY) Medical Exam completed:  (NA)  Crisis Care Plan Living Arrangements: Group Home Name of Psychiatrist: Refused to answer the question  Name of Therapist: Refused to answer the question  Education Status Is patient currently in school?: No Current Grade: NA Highest grade of school patient has completed: NA Name of school: NA Contact person: NA  Risk to self with the past 6 months Suicidal Ideation:  (Refused to answer the question) Has patient been a risk to self within the past 6 months prior to admission? :  (Refused to answer the question) Suicidal Intent:  (Refused to answer the question) Has patient had any suicidal intent within the past 6 months prior to admission? :  (Refused to answer the question) Is patient at risk for suicide?:  (  Refused to answer the question) Suicidal Plan?:  (Refused to answer the question) Has patient had any suicidal plan within the past 6 months prior to admission? :  (Refused to answer the question) Access to Means:  (Refused to answer the question) What has been your use of drugs/alcohol within the last 12 months?:  (Refused to answer the question) Previous Attempts/Gestures:  (Refused  to answer the question) How many times?:  (UTA) Other Self Harm Risks: UTA Triggers for Past Attempts:  (UTA) Intentional Self Injurious Behavior:  (UTA) Family Suicide History:  (UTA) Recent stressful life event(s):  (Refused to answer the question) Persecutory voices/beliefs?: Yes Depression: Yes Depression Symptoms: Despondent, Tearfulness, Insomnia, Isolating, Fatigue, Guilt, Loss of interest in usual pleasures, Feeling worthless/self pity, Feeling angry/irritable Substance abuse history and/or treatment for substance abuse?: No Suicide prevention information given to non-admitted patients: Yes  Risk to Others within the past 6 months Homicidal Ideation: No Does patient have any lifetime risk of violence toward others beyond the six months prior to admission? : No Thoughts of Harm to Others: No Current Homicidal Intent: No Current Homicidal Plan: No Access to Homicidal Means: No Identified Victim: NA History of harm to others?: No Assessment of Violence: None Noted Violent Behavior Description: NA Does patient have access to weapons?: No Criminal Charges Pending?: No Does patient have a court date: No Is patient on probation?: No  Psychosis Hallucinations: Auditory, Visual Delusions: Grandiose  Mental Status Report Appearance/Hygiene: Bizarre Eye Contact: Poor Motor Activity: Freedom of movement Speech:  (Pt refused to speak only nodded his head) Level of Consciousness: Alert Mood: Depressed Affect: Anxious, Fearful Anxiety Level: None Thought Processes: Flight of Ideas, Thought Blocking Judgement: Impaired Orientation: Unable to assess Obsessive Compulsive Thoughts/Behaviors: None  Cognitive Functioning Concentration: Decreased Memory: Unable to Assess IQ: Average Insight: Unable to Assess Impulse Control: Unable to Assess Appetite:  (Refused to answer the question) Sleep: Unable to Assess Vegetative Symptoms: Unable to Assess  ADLScreening [facility]  Assessment Services) Patient's cognitive ability adequate to safely complete daily activities?: Yes Patient able to express need for assistance with ADLs?: Yes Independently performs ADLs?: Yes (appropriate for developmental age)  Prior Inpatient Therapy Prior Inpatient Therapy: Yes Prior Therapy Dates:  (Refused to answer the question) Prior Therapy Facilty/Provider(s):  (Refused to answer the question) Reason for Treatment:  (Refused to answer the question)  Prior Outpatient Therapy Prior Outpatient Therapy: Yes Prior Therapy Dates: Ongoing Prior Therapy Facilty/Provider(s): Group Home  Reason for Treatment: Group Home Does patient have an ACCT team?: No Does patient have Intensive In-House Services?  : No Does patient have [facility] services? : No Does patient have P4CC services?: No  ADL Screening (condition at time of admission) Patient's cognitive ability adequate to safely complete daily activities?: Yes Is the patient deaf or have difficulty hearing?: No Does the patient have difficulty seeing, even when wearing glasses/contacts?: No Does the patient have difficulty concentrating, remembering, or making decisions?: Yes Patient able to express need for assistance with ADLs?: Yes Does the patient have difficulty dressing or bathing?: No Independently performs ADLs?: Yes (appropriate for developmental age) Does the patient have difficulty walking or climbing stairs?: No Weakness of Legs: None Weakness of Arms/Hands: None  Home Assistive Devices/Equipment Home Assistive Devices/Equipment: None    Abuse/Neglect Assessment (Assessment to be complete while patient is alone) Physical Abuse: Denies Verbal Abuse: Denies Sexual Abuse: Denies Exploitation of patient/patient's resources: Denies Self-Neglect: Denies Values / Beliefs Cultural Requests During Hospitalization: None Spiritual Requests During Hospitalization: None  Advance Directives (For Healthcare) Does Patient  Have a Medical Advance Directive?: No Would patient like information on creating a medical advance directive?: No - Patient declined    Additional Information 1:1 In Past 12 Months?: No CIRT Risk: No Elopement Risk: No Does patient have medical clearance?: Yes     Disposition: Per Dr.Clapacs - patient meets criteria for inpatient hospitalization .   Disposition Initial Assessment Completed for this Encounter: Yes Disposition of Patient: Inpatient treatment program (Per Dr. Toni Amendlapacs - pt meets inpt criteria f)  On Site Evaluation by:   Reviewed with Physician:    Phillip HealStevenson, Evelisse Szalkowski [place] 08/31/2016 8:17 PM

## 2016-08-31 NOTE — ED Notes (Signed)
Called lab to add on lithium

## 2016-08-31 NOTE — ED Notes (Signed)
Pt refused to talk to TTS, pt sitting up on stretcher in 19H.

## 2016-08-31 NOTE — ED Notes (Signed)
Pt took out his own IV , gauze applied. Bleeding controlled.

## 2016-09-01 ENCOUNTER — Encounter: Payer: Self-pay | Admitting: Psychiatry

## 2016-09-01 DIAGNOSIS — F250 Schizoaffective disorder, bipolar type: Principal | ICD-10-CM

## 2016-09-01 DIAGNOSIS — F17200 Nicotine dependence, unspecified, uncomplicated: Secondary | ICD-10-CM

## 2016-09-01 LAB — LIPID PANEL
Cholesterol: 135 mg/dL (ref 0–200)
HDL: 32 mg/dL — AB (ref >40)
LDL CALC: 54 mg/dL (ref 0–99)
Total CHOL/HDL Ratio: 4.2 RATIO
Triglycerides: 247 mg/dL — ABNORMAL HIGH (ref <150)
VLDL: 49 mg/dL — ABNORMAL HIGH (ref 0–40)

## 2016-09-01 LAB — GLUCOSE, CAPILLARY
GLUCOSE-CAPILLARY: 112 mg/dL — AB (ref 65–99)
GLUCOSE-CAPILLARY: 126 mg/dL — AB (ref 65–99)
GLUCOSE-CAPILLARY: 128 mg/dL — AB (ref 65–99)
Glucose-Capillary: 117 mg/dL — ABNORMAL HIGH (ref 65–99)

## 2016-09-01 LAB — TSH: TSH: 0.835 u[IU]/mL (ref 0.350–4.500)

## 2016-09-01 LAB — PROTIME-INR
INR: 1.55
Prothrombin Time: 18.7 seconds — ABNORMAL HIGH (ref 11.4–15.2)

## 2016-09-01 LAB — HEMOGLOBIN A1C
HEMOGLOBIN A1C: 5.3 % (ref 4.8–5.6)
MEAN PLASMA GLUCOSE: 105 mg/dL

## 2016-09-01 MED ORDER — ASENAPINE MALEATE 5 MG SL SUBL: 5.0000 mg | SUBLINGUAL_TABLET | Freq: Two times a day (BID) | SUBLINGUAL | Status: DC

## 2016-09-01 MED ORDER — POLYETHYLENE GLYCOL 3350 17 G PO PACK
17.0000 g | PACK | Freq: Every day | ORAL | Status: DC
  Administered 2016-09-01 – 2016-09-14 (×14): 17 g via ORAL
  Filled 2016-09-01 (×14): qty 1

## 2016-09-01 MED ORDER — DOCUSATE SODIUM 100 MG PO CAPS
200.0000 mg | ORAL_CAPSULE | Freq: Two times a day (BID) | ORAL | Status: DC
  Administered 2016-09-01 – 2016-09-04 (×6): 200 mg via ORAL
  Filled 2016-09-01 (×6): qty 2

## 2016-09-01 MED ORDER — LITHIUM CARBONATE ER 450 MG PO TBCR
450.0000 mg | EXTENDED_RELEASE_TABLET | Freq: Two times a day (BID) | ORAL | Status: DC
  Administered 2016-09-01 – 2016-09-14 (×26): 450 mg via ORAL
  Filled 2016-09-01 (×28): qty 1

## 2016-09-01 MED ORDER — BUPROPION HCL ER (XL) 150 MG PO TB24
150.0000 mg | ORAL_TABLET | Freq: Every day | ORAL | Status: DC
  Administered 2016-09-01 – 2016-09-14 (×14): 150 mg via ORAL
  Filled 2016-09-01 (×14): qty 1

## 2016-09-01 MED ORDER — NICOTINE 21 MG/24HR TD PT24
21.0000 mg | MEDICATED_PATCH | Freq: Every day | TRANSDERMAL | Status: DC
  Administered 2016-09-01 – 2016-09-13 (×9): 21 mg via TRANSDERMAL
  Filled 2016-09-01 (×12): qty 1

## 2016-09-01 MED ORDER — CLOZAPINE 25 MG PO TABS
450.0000 mg | ORAL_TABLET | Freq: Every day | ORAL | Status: DC
  Administered 2016-09-02 – 2016-09-03 (×2): 450 mg via ORAL
  Filled 2016-09-01 (×3): qty 2

## 2016-09-01 MED ORDER — METFORMIN HCL ER 500 MG PO TB24
1000.0000 mg | ORAL_TABLET | Freq: Two times a day (BID) | ORAL | Status: DC
  Administered 2016-09-01 – 2016-09-14 (×26): 1000 mg via ORAL
  Filled 2016-09-01 (×26): qty 2

## 2016-09-01 MED ORDER — LORAZEPAM 2 MG PO TABS
2.0000 mg | ORAL_TABLET | Freq: Every day | ORAL | Status: DC
  Administered 2016-09-01: 2 mg via ORAL
  Filled 2016-09-01: qty 1

## 2016-09-01 MED ORDER — CLOZAPINE 25 MG PO TABS
350.0000 mg | ORAL_TABLET | Freq: Every day | ORAL | Status: AC
  Administered 2016-09-01: 22:00:00 350 mg via ORAL
  Filled 2016-09-01 (×4): qty 2

## 2016-09-01 NOTE — [facility] Suicide Risk Assessment (Signed)
[facility]   Nursing information obtained from:  Patient Demographic factors:  NA Current Mental Status:  NA Loss Factors:  NA Historical Factors:  Family history of suicide Risk Reduction Factors:  Religious beliefs about death  Total Time spent with patient: 1 hour Principal Problem: Schizoaffective disorder, bipolar type (HCC) Diagnosis:   Patient Active Problem List   Diagnosis Date Noted  . Tobacco use disorder [F17.200] 09/01/2016  . Schizoaffective disorder, bipolar type (HCC) [F25.0] 05/22/2016  . Diabetes mellitus without complication (HCC) [E11.9] 05/21/2016  . Hyperlipidemia [E78.5] 05/21/2016  . History of pulmonary embolism [Z86.711] 05/21/2016  . Chronic anticoagulation [Z79.01] 05/21/2016  . Hypertension [I10] 09/25/2015   Subjective Data:   Continued Clinical Symptoms:  Alcohol Use Disorder Identification Test Final Score (AUDIT): 1 The "Alcohol Use Disorders Identification Test", Guidelines for Use in Primary Care, Second Edition.  World Science writerHealth Organization [facility](WHO). Score between 0-7:  no or low risk or alcohol related problems. Score between 8-15:  moderate risk of alcohol related problems. Score between 16-19:  high risk of alcohol related problems. Score 20 or above:  warrants further diagnostic evaluation for alcohol dependence and treatment.   CLINICAL FACTORS:   Severe Anxiety and/or Agitation Currently Psychotic Previous Psychiatric Diagnoses and Treatments    Physical Exam  ROS  Blood pressure 111/69, pulse 67, temperature 98 F (36.7 C), temperature source Oral, resp. rate 17, height 5\' 10"  (1.778 m), weight 95.3 kg (210 lb), SpO2 97 %.Body mass index is 30.13 kg/m.                                                    Sleep:  Number of Hours: 5.75      COGNITIVE FEATURES THAT CONTRIBUTE TO RISK:  Loss of executive function    SUICIDE RISK:   Moderate:  Frequent suicidal ideation with  limited intensity, and duration, some specificity in terms of plans, no associated intent, good self-control, limited dysphoria/symptomatology, some risk factors present, and identifiable protective factors, including available and accessible social support.  [facility]  I certify that inpatient services furnished can reasonably be expected to improve the patient's condition.   Jimmy FootmanHernandez-Gonzalez,  Dewane Timson, MD 09/01/2016, 12:42 PM
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
## 2016-09-01 NOTE — B[facility] Group Notes (Signed)
[facility] LCSW Group Therapy Note  Date/Time: 09/01/16, 1500  Type of Therapy/Topic:  Group Therapy:  Feelings about Diagnosis  Participation Level:  Active   Mood: pleasant   Description of Group:    This group will allow patients to explore their thoughts and feelings about diagnoses they have received. Patients will be guided to explore their level of understanding and acceptance of these diagnoses. Facilitator will encourage patients to process their thoughts and feelings about the reactions of others to their diagnosis, and will guide patients in identifying ways to discuss their diagnosis with significant others in their lives. This group will be process-oriented, with patients participating in exploration of their own experiences as well as giving and receiving support and challenge from other group members.   Therapeutic Goals: 1. Patient will demonstrate understanding of diagnosis as evidence by identifying two or more symptoms of the disorder:  2. Patient will be able to express two feelings regarding the diagnosis 3. Patient will demonstrate ability to communicate their needs through discussion and/or role plays  Summary of Patient Progress: Pt shared that his diagnosis was schizoaffective disorder and that two of the symptoms he experiences are delusions and thoughts of grandeur.  Pt asked several questions during group, including if anybody is immortal, and was a fairly active participant.        Therapeutic Modalities:   Cognitive Behavioral Therapy Brief Therapy Feelings Identification   Daleen SquibbGreg Kathleen Likins, LCSW

## 2016-09-01 NOTE — Progress Notes (Signed)
Recreation Therapy Notes  At approximately 1:20 pm, LRT attempted assessment. Patient sleeping and would not wake up when name was called.  Jacquelynn CreeGreene,Joden Bonsall M, LRT/CTRS 09/01/2016 1:47 PM

## 2016-09-01 NOTE — [facility] Suicide Risk Assessment (Signed)
[facility] INPATIENT:  Family/Significant Other Suicide Prevention Education  Suicide Prevention Education:  Education Completed; Lance [name], legal guardian, [phone], has been identified by the patient as the family member/significant other with whom the patient will be residing, and identified as the person(s) who will aid the patient in the event of a mental health crisis (suicidal ideations/suicide attempt).  With written consent from the patient, the family member/significant other has been provided the following suicide prevention education, prior to the and/or following the discharge of the patient.  The suicide prevention education provided includes the following:  Suicide risk factors  Suicide prevention and interventions  National Suicide Hotline telephone number  [facility] assessment telephone number  [facility] 911  [address] and/or Residential Mobile Crisis Unit telephone number  Request made of family/significant other to:  Remove weapons (e.g., guns, rifles, knives), all items previously/currently identified as safety concern.    Remove drugs/medications (over-the-counter, prescriptions, illicit drugs), all items previously/currently identified as a safety concern.  The family member/significant other verbalizes understanding of the suicide prevention education information provided.  The family member/significant other agrees to remove the items of safety concern listed above.  Lance [name], Lance Bruning Jon, LCSW 09/01/2016, 10:51 AM

## 2016-09-01 NOTE — Tx Team (Signed)
Initial Treatment Plan 09/01/2016 3:01 AM Lance AlbrightJames P Munford [id number]    PATIENT STRESSORS: Health problems Medication change or noncompliance   PATIENT STRENGTHS: Motivation for treatment/growth Physical Health   PATIENT IDENTIFIED PROBLEMS: Psychosis     Suicidal ideation                  DISCHARGE CRITERIA:  Improved stabilization in mood, thinking, and/or behavior Motivation to continue treatment in a less acute level of care  PRELIMINARY DISCHARGE PLAN: Placement in alternative living arrangements  PATIENT/FAMILY INVOLVEMENT: This treatment plan has been presented to and reviewed with the patient, Lance AlbrightJames P [name] The patient and family have been given the opportunity to ask questions and make suggestions.  Governor Speckinglubukola Abisola Jabez Molner, RN 09/01/2016, 3:01 AM

## 2016-09-01 NOTE — Progress Notes (Signed)
Denies SI/HI/AVH although states that he was hearing voices when he first got here.  Stays to self.  Minimal interaction.  Medication and group compliant.  Safety checks maintained.  Support offered.

## 2016-09-01 NOTE — Progress Notes (Signed)
Recreation Therapy Notes  INPATIENT RECREATION THERAPY ASSESSMENT  [name] MRN: [id number] DOB: May 16, 1976 Today's Date: 09/01/2016  Patient Stressors: Other (Comment) (Living situation - patient lives in a group home and patient stated it is "not nice" and calls it a "beat up shack")  Coping Skills:   Avoidance, Exercise, Art/Dance, Talking, Music, Sports  Personal Challenges: Anger, Concentration, Decision-Making, Problem-Solving, Relationships, Stress Management, Substance Abuse, Time Management, Trusting Others  Leisure Interests (2+):  Individual - Other (Comment) (Walking, shopping)  Awareness of Community Resources:  No  Community Resources:     Current Use:    If no, Barriers?:    Patient Strengths:  Good heart, kind  Patient Identified Areas of Improvement:  Intellect  Current Recreation Participation:  Walking outside  Patient Goal for Hospitalization:  To get to feeling better  [place]  [place]idence[place]   Current SI (including self-harm):  No  Current HI:  No  Consent to Intern Participation: N/A   Jacquelynn CreeGreene,Adeola Dennen M, LRT/CTRS  09/01/2016, 4:18 PM

## 2016-09-01 NOTE — Progress Notes (Signed)
MEDICATION RELATED CONSULT NOTE - INITIAL   Pharmacy Consult for Clozapine Monitoring Indication: Schizphrenia  Allergies  Allergen Reactions  . Penicillins     Patient Measurements: Height: 5\' 10"  (177.8 cm) Weight: 210 lb (95.3 kg) IBW/kg (Calculated) : 73  Vital Signs: Temp: 98 F (36.7 C) (03/20 0704) Temp Source: Oral (03/20 0704) BP: 111/69 (03/20 0704) Pulse Rate: 67 (03/20 0704) Intake/Output from previous day: No intake/output data recorded. Intake/Output from this shift: Total I/O In: 720 [P.O.:720] Out: -   Labs:  Recent Labs  08/31/16 1012 08/31/16 1853  WBC 8.2 8.3  HGB 16.0 15.2  HCT 45.1 44.0  PLT 280 230  CREATININE 1.03  --   ALBUMIN 4.9  --   PROT 7.7  --   AST 20  --   ALT 19  --   ALKPHOS 55  --   BILITOT 1.2  --    Estimated Creatinine Clearance: 108.2 mL/min (by C-G formula based on SCr of 1.03 mg/dL).   Microbiology: No results found for this or any previous visit (from the past 720 hour(s)).  Medical History: Past Medical History:  Diagnosis Date  . Depression   . Diabetes mellitus without complication (HCC)   . Hyperlipidemia   . Hypertension   . PE (pulmonary thromboembolism) (HCC)   . Schizo affective schizophrenia (HCC)     Medications:  Scheduled:  . buPROPion  150 mg Oral Daily  . cloZAPine  350 mg Oral QHS  . [START ON 09/02/2016] cloZAPine  450 mg Oral QHS  . docusate sodium  200 mg Oral BID  . insulin aspart  0-15 Units Subcutaneous TID WC  . lithium carbonate  450 mg Oral Q12H  . LORazepam  2 mg Oral QHS  . metFORMIN  1,000 mg Oral BID WC  . mometasone-formoterol  2 puff Inhalation BID  . nicotine  21 mg Transdermal Daily  . polyethylene glycol  17 g Oral Daily  . simvastatin  40 mg Oral q1800  . warfarin  8 mg Oral ONCE-1800    Assessment: 41 yo male with schizoaffective disorder bipolar type  Goal of Therapy:  Avoid adverse effects from Clozapine  Plan:  Clozapine Registry eligible for  dispensing DOB [birth year] [place] [address] Dr. Toni Amendlapacs  [id number]  Next ANC due:  09/07/2016  Ardyth HarpsKaren Raeya Merritts, RPh  09/01/2016,1:35 PM

## 2016-09-01 NOTE — Progress Notes (Signed)
Admission Note:  [age]yr male who presents IVC in no acute distress for the treatment of SI and Depression. Pt appears flat and depressed. Pt was calm and cooperative with admission process. Pt presents with passive SI and contracts for safety upon admission. Pt denies AVH . Pt experienced worsening depression, racing thoughts and auditory hallucination while at the group home. Patient has Past medical Hx of  Depression,  DM without complication, COPD, Dyslipidemia and HTN. Patient's CBG was 84 upon admission. Pt says he lives in a group home. Skin was assessed and found to be clear of any abnormal marks, however patient was noted to have an obese abdomen, with some striae noted all over the abdominal area. Bowel sounds x 4 present, abdomen is soft to touch, denies pain or tenderness. Patient was searched and no contraband found, POC and unit policies explained and understanding verbalized. Consents obtained. Food and fluids offered, and fluids accepted. Pt had no additional questions or concerns,will continue to monitor closely.

## 2016-09-01 NOTE — Progress Notes (Signed)
Recreation Therapy Notes  Date: 03.20.18 Time: 9:30 am Location: Craft Room  Group Topic: Goal Setting  Goal Area(s) Addresses:  Patient will write at least one goal. Patient will write at least one obstacle.  Behavioral Response: Left early  Intervention: Recovery Goal Chart  Activity: Patients were instructed to make a Recovery Goal Chart including goals, obstacles, the date they started working on their goals, and the date they achieved their goals.  Education: LRT educated patients on healthy ways to celebrate achieving their goals.  Education Outcome: Patient left before LRT educated group.  Clinical Observations/Feedback: Patient left group at approximately 9:35 am with Child psychotherapistsocial worker and did not return to group.  Jacquelynn CreeGreene,Haylynn Pha M, LRT/CTRS 09/01/2016 10:03 AM

## 2016-09-01 NOTE — Consult Note (Signed)
ANTICOAGULATION CONSULT NOTE - Initial Consult  Pharmacy Consult for warfarin Indication: Hx of PE  Allergies  Allergen Reactions  . Penicillins     Patient Measurements: Height: 5\' 10"  (177.8 cm) Weight: 210 lb (95.3 kg) IBW/kg (Calculated) : 73 Heparin Dosing Weight:   Vital Signs: Temp: 98 F (36.7 C) (03/20 0704) Temp Source: Oral (03/20 0704) BP: 111/69 (03/20 0704) Pulse Rate: 67 (03/20 0704)  Labs:  Recent Labs  08/31/16 1012 08/31/16 1853  HGB 16.0 15.2  HCT 45.1 44.0  PLT 280 230  LABPROT  --  16.0*  INR  --  1.27  CREATININE 1.03  --     Estimated Creatinine Clearance: 108.2 mL/min (by C-G formula based on SCr of 1.03 mg/dL).   Medical History: Past Medical History:  Diagnosis Date  . Depression   . Diabetes mellitus without complication (HCC)   . Hyperlipidemia   . Hypertension   . PE (pulmonary thromboembolism) (HCC)   . Schizo affective schizophrenia (HCC)     Medications:  Scheduled:  . buPROPion  150 mg Oral Daily  . cloZAPine  350 mg Oral QHS  . [START ON 09/02/2016] cloZAPine  450 mg Oral QHS  . docusate sodium  200 mg Oral BID  . insulin aspart  0-15 Units Subcutaneous TID WC  . lithium carbonate  450 mg Oral Q12H  . LORazepam  2 mg Oral QHS  . metFORMIN  1,000 mg Oral BID WC  . mometasone-formoterol  2 puff Inhalation BID  . nicotine  21 mg Transdermal Daily  . polyethylene glycol  17 g Oral Daily  . simvastatin  40 mg Oral q1800  . warfarin  8 mg Oral ONCE-1800    Assessment: Pt is a 41 year old male who presents from his group home with N/V/[name] and visual hallucinations. Pt has a PMH of schizoaffective disorder bipolar type and PE on warfarin. INR on admission is low at 1.27. Group home reports that they give the patient 12.5mg  alternating with 2.5mg . Called the cardiologist office who states that his INR in the beginning of Feb was 1.2. He was instructed to take 10mg  daily and on recheck on 28 Feb INR was 2.5. He was told to  continue to take this dose. Of note, office states that the group home has a hx of not bringing pt to appointments. Prior to Oct 2017 visit, pt had not been seen since march of 2017 for INR check. At pt last hospital stay pharmacy was dosing pt warfarin at 7.5mg  daily- INR was just therapeutic after about a week.  3/19  INR 1.27  Warfarin 8mg  3/20  INR 1.55   Goal of Therapy:  INR 2-3 Monitor platelets by anticoagulation protocol: Yes   Plan:  Since we are unsure of patients actually home dose and if he was even really taking warfarin we will attempt to find a good dose for patient. I will continue with 8mg  tonight and recheck INR in the AM.  Lance FlossMelissa [name] Lance [name], Pharm.[name], BCPS Clinical Pharmacist  09/01/2016,3:06 PM

## 2016-09-02 LAB — PROTIME-INR
INR: 1.73
PROTHROMBIN TIME: 20.5 s — AB (ref 11.4–15.2)

## 2016-09-02 LAB — GLUCOSE, CAPILLARY
GLUCOSE-CAPILLARY: 102 mg/dL — AB (ref 65–99)
Glucose-Capillary: 130 mg/dL — ABNORMAL HIGH (ref 65–99)
Glucose-Capillary: 159 mg/dL — ABNORMAL HIGH (ref 65–99)
Glucose-Capillary: 93 mg/dL (ref 65–99)

## 2016-09-02 LAB — PROLACTIN: Prolactin: 2 ng/mL — ABNORMAL LOW (ref 4.0–15.2)

## 2016-09-02 MED ORDER — LORAZEPAM 1 MG PO TABS
1.0000 mg | ORAL_TABLET | Freq: Every day | ORAL | Status: DC
  Administered 2016-09-02: 1 mg via ORAL
  Filled 2016-09-02: qty 1

## 2016-09-02 MED ORDER — WARFARIN SODIUM 4 MG PO TABS
8.0000 mg | ORAL_TABLET | Freq: Once | ORAL | Status: AC
  Administered 2016-09-02: 8 mg via ORAL
  Filled 2016-09-02: qty 2

## 2016-09-02 MED ORDER — WARFARIN - PHARMACIST DOSING INPATIENT
Freq: Every day | Status: DC
  Administered 2016-09-02 – 2016-09-13 (×9)

## 2016-09-02 NOTE — Plan of Care (Signed)
Problem: Coping: Goal: Ability to verbalize frustrations and anger appropriately will improve Outcome: Progressing Working on coping skills    

## 2016-09-02 NOTE — Progress Notes (Signed)
D :Isolating to room during shift .  Apppropriate ADL's  Showered  Limited group participation   Patient stated slept good last night .Stated appetite is good and energy level  Is normal. Stated concentration is good . Stated on Depression scale 3, hopeless 3 and anxiety 3 .( low 0-10 high) Denies suicidal  homicidal ideations  . Auditory hallucinations stated that is why is is here. No pain concerns .  Limited  Interacting with peers and staff.  A: Encourage patient participation with unit programming . Instruction  Given on  Medication , verbalize understanding. R: Voice no other concerns. Staff continue to monitor

## 2016-09-02 NOTE — Progress Notes (Signed)
Recreation Therapy Notes  At approximately 1:00 pm, LRT attempted treatment session. Patient sleeping and would not wake up when name was called.  Jacquelynn CreeGreene,Jalessa Peyser M, LRT/CTRS 09/02/2016 1:03 PM

## 2016-09-02 NOTE — Consult Note (Signed)
ANTICOAGULATION CONSULT NOTE -  Pharmacy Consult for warfarin Indication: Hx of PE  Allergies  Allergen Reactions  . Penicillins     Patient Measurements: Height: 5\' 10"  (177.8 cm) Weight: 210 lb (95.3 kg) IBW/kg (Calculated) : 73 Heparin Dosing Weight:   Vital Signs: BP: 117/62 (03/21 0657) Pulse Rate: 71 (03/21 0657)  Labs:  Recent Labs  08/31/16 1012 08/31/16 1853 09/01/16 1530 09/02/16 0644  HGB 16.0 15.2  --   --   HCT 45.1 44.0  --   --   PLT 280 230  --   --   LABPROT  --  16.0* 18.7* 20.5*  INR  --  1.27 1.55 1.73  CREATININE 1.03  --   --   --     Estimated Creatinine Clearance: 108.2 mL/min (by C-G formula based on SCr of 1.03 mg/dL).   Medical History: Past Medical History:  Diagnosis Date  . Depression   . Diabetes mellitus without complication (HCC)   . Hyperlipidemia   . Hypertension   . PE (pulmonary thromboembolism) (HCC)   . Schizo affective schizophrenia (HCC)     Medications:  Scheduled:  . buPROPion  150 mg Oral Daily  . cloZAPine  450 mg Oral QHS  . docusate sodium  200 mg Oral BID  . insulin aspart  0-15 Units Subcutaneous TID WC  . lithium carbonate  450 mg Oral Q12H  . LORazepam  2 mg Oral QHS  . metFORMIN  1,000 mg Oral BID WC  . mometasone-formoterol  2 puff Inhalation BID  . nicotine  21 mg Transdermal Daily  . polyethylene glycol  17 g Oral Daily  . simvastatin  40 mg Oral q1800    Assessment: Pt is a 41 year old male who presents from his group home with N/V/D and visual hallucinations. Pt has a PMH of schizoaffective disorder bipolar type and PE on warfarin. INR on admission is low at 1.27. Group home reports that they give the patient 12.5mg  alternating with 2.5mg . Called the cardiologist office who states that his INR in the beginning of Feb was 1.2. He was instructed to take 10mg  daily and on recheck on 28 Feb INR was 2.5. He was told to continue to take this dose. Of note, office states that the group home has a hx of  not bringing pt to appointments. Prior to Oct 2017 visit, pt had not been seen since march of 2017 for INR check. At pt last hospital stay pharmacy was dosing pt warfarin at 7.5mg  daily- INR was just therapeutic after about a week.  3/19  INR 1.27  Warfarin 8mg  3/20  INR 1.55  8 mg 3/21  INR 1.73   Goal of Therapy:  INR 2-3 Monitor platelets by anticoagulation protocol: Yes   Plan:  3/20- Since we are unsure of patients actually home dose and if he was even really taking warfarin we will attempt to find a good dose for patient. I will continue with 8mg  tonight and recheck INR in the AM.  3/21- Will continue with Warfarin 8 mg at this time.  F/u INR in am.    Bari MantisKristin Santino Kinsella PharmD Clinical Pharmacist 09/02/2016

## 2016-09-02 NOTE — Tx Team (Signed)
Interdisciplinary Treatment and Diagnostic Plan Update  09/02/2016 Time of Session: 11:00am Lance [name]  Principal Diagnosis: Schizoaffective disorder, bipolar type [facility](HCC)  Secondary Diagnoses: Principal Problem:   Schizoaffective disorder, bipolar type (HCC) Active Problems:   Hypertension   Diabetes mellitus without complication (HCC)   Hyperlipidemia   History of pulmonary embolism   Chronic anticoagulation   Tobacco use disorder   Current Medications:  Current Facility-Administered Medications  Medication Dose Route Frequency Provider Last Rate Last Dose  . acetaminophen (TYLENOL) tablet 650 mg  650 mg Oral Q6H PRN Audery AmelJohn T Clapacs, MD      . alum & mag hydroxide-simeth (MAALOX/MYLANTA) 200-200-20 MG/5ML suspension 30 mL  30 mL Oral Q4H PRN Audery AmelJohn T Clapacs, MD      . buPROPion (WELLBUTRIN XL) 24 hr tablet 150 mg  150 mg Oral Daily Jimmy FootmanAndrea Hernandez-Gonzalez, MD   150 mg at 09/02/16 0743  . cloZAPine (CLOZARIL) tablet 450 mg  450 mg Oral QHS Jimmy FootmanAndrea Hernandez-Gonzalez, MD      . docusate sodium (COLACE) capsule 200 mg  200 mg Oral BID Jimmy FootmanAndrea Hernandez-Gonzalez, MD   200 mg at 09/02/16 0742  . insulin aspart (novoLOG) injection 0-15 Units  0-15 Units Subcutaneous TID WC Audery AmelJohn T Clapacs, MD   2 Units at 09/02/16 0749  . lithium carbonate (ESKALITH) CR tablet 450 mg  450 mg Oral Q12H Jimmy FootmanAndrea Hernandez-Gonzalez, MD   450 mg at 09/02/16 0953  . LORazepam (ATIVAN) tablet 1 mg  1 mg Oral QHS Jimmy FootmanAndrea Hernandez-Gonzalez, MD      . magnesium hydroxide (MILK OF MAGNESIA) suspension 30 mL  30 mL Oral Daily PRN Audery AmelJohn T Clapacs, MD      . metFORMIN (GLUCOPHAGE-XR) 24 hr tablet 1,000 mg  1,000 mg Oral BID WC Jimmy FootmanAndrea Hernandez-Gonzalez, MD   1,000 mg at 09/02/16 0742  . mometasone-formoterol (DULERA) 200-5 MCG/ACT inhaler 2 puff  2 puff Inhalation BID Audery AmelJohn T Clapacs, MD   2 puff at 09/02/16 0741  . nicotine (NICODERM CQ - dosed in mg/24 hours) patch 21 mg  21 mg Transdermal Daily Jimmy FootmanAndrea  Hernandez-Gonzalez, MD   21 mg at 09/02/16 0800  . polyethylene glycol (MIRALAX / GLYCOLAX) packet 17 g  17 g Oral Daily Jimmy FootmanAndrea Hernandez-Gonzalez, MD   17 g at 09/02/16 0741  . simvastatin (ZOCOR) tablet 40 mg  40 mg Oral q1800 Audery AmelJohn T Clapacs, MD   40 mg at 09/01/16 1754  . warfarin (COUMADIN) tablet 8 mg  8 mg Oral ONCE-1800 Jimmy FootmanAndrea Hernandez-Gonzalez, MD      . Warfarin - Pharmacist Dosing Inpatient   Does not apply [id number] Jimmy FootmanAndrea Hernandez-Gonzalez, MD       PTA Medications: Prescriptions Prior to Admission  Medication Sig Dispense Refill Last Dose  . cloZAPine (CLOZARIL) 100 MG tablet Take 100-300 mg by mouth 2 (two) times daily. 100mg  in the morning and 300mg  at bedtime   unknown at unknown  . Fluticasone-Salmeterol (ADVAIR) 250-50 MCG/DOSE AEPB Inhale 1 puff into the lungs 2 (two) times daily.   unknown at unknown  . lithium carbonate 300 MG capsule Take 900 mg by mouth at bedtime.   unknown at unknown  . loperamide (IMODIUM A-D) 2 MG tablet Take 1 tablet (2 mg total) by mouth 4 (four) times daily as needed for diarrhea or loose stools. 12 tablet 0 prn at prn  . metFORMIN (GLUCOPHAGE) 1000 MG tablet Take 1,000 mg by mouth 2 (two) times daily with a meal.   unknown at unknown  .  omega-3 acid ethyl esters (LOVAZA) 1 g capsule Take 1 g by mouth daily.   unknown at unknown  . ondansetron (ZOFRAN ODT) 4 MG disintegrating tablet Take 1 tablet (4 mg total) by mouth every 8 (eight) hours as needed for nausea or vomiting. 20 tablet 0 prn at prn  . PARoxetine (PAXIL-CR) 25 MG 24 hr tablet Take 25 mg by mouth daily.   unknown at unknown  . simvastatin (ZOCOR) 40 MG tablet Take 40 mg by mouth at bedtime.   unknown at unknown  . traZODone (DESYREL) 100 MG tablet Take 150 mg by mouth at bedtime.    unknown at unknown  . warfarin (COUMADIN) 10 MG tablet Take 10 mg by mouth every other day.   unknown at unknown  . ziprasidone (GEODON) 80 MG capsule Take 80 mg by mouth 2 (two) times daily with a meal.    unknown at unknown    Patient Stressors: Health problems Medication change or noncompliance  Patient Strengths: Motivation for treatment/growth Physical Health  Treatment Modalities: Medication Management, Group therapy, Case management,  1 to 1 session with clinician, Psychoeducation, Recreational therapy.   Physician Treatment Plan for Primary Diagnosis: Schizoaffective disorder, bipolar type (HCC) Long Term Goal(s): Improvement in symptoms so as ready for discharge Improvement in symptoms so as ready for discharge   Short Term Goals: Ability to identify and develop effective coping behaviors will improve Ability to identify triggers associated with substance abuse/mental health issues will improve Ability to identify changes in lifestyle to reduce recurrence of condition will improve Ability to verbalize feelings will improve  Medication Management: Evaluate patient's response, side effects, and tolerance of medication regimen.  Therapeutic Interventions: 1 to 1 sessions, Unit Group sessions and Medication administration.  Evaluation of Outcomes: Progressing  Physician Treatment Plan for Secondary Diagnosis: Principal Problem:   Schizoaffective disorder, bipolar type (HCC) Active Problems:   Hypertension   Diabetes mellitus without complication (HCC)   Hyperlipidemia   History of pulmonary embolism   Chronic anticoagulation   Tobacco use disorder  Long Term Goal(s): Improvement in symptoms so as ready for discharge Improvement in symptoms so as ready for discharge   Short Term Goals: Ability to identify and develop effective coping behaviors will improve Ability to identify triggers associated with substance abuse/mental health issues will improve Ability to identify changes in lifestyle to reduce recurrence of condition will improve Ability to verbalize feelings will improve     Medication Management: Evaluate patient's response, side effects, and tolerance of  medication regimen.  Therapeutic Interventions: 1 to 1 sessions, Unit Group sessions and Medication administration.  Evaluation of Outcomes: Progressing   RN Treatment Plan for Primary Diagnosis: Schizoaffective disorder, bipolar type (HCC) Long Term Goal(s): Knowledge of disease and therapeutic regimen to maintain health will improve  Short Term Goals: Ability to remain free from injury will improve, Ability to demonstrate self-control, Ability to disclose and discuss suicidal ideas, Ability to identify and develop effective coping behaviors will improve and Compliance with prescribed medications will improve  Medication Management: RN will administer medications as ordered by provider, will assess and evaluate patient's response and provide education to patient for prescribed medication. RN will report any adverse and/or side effects to prescribing provider.  Therapeutic Interventions: 1 on 1 counseling sessions, Psychoeducation, Medication administration, Evaluate responses to treatment, Monitor vital signs and CBGs as ordered, Perform/monitor CIWA, COWS, AIMS and Fall Risk screenings as ordered, Perform wound care treatments as ordered.  Evaluation of Outcomes: Progressing   LCSW Treatment  Plan for Primary Diagnosis: Schizoaffective disorder, bipolar type (HCC) Long Term Goal(s): Safe transition to appropriate next level of care at discharge, Engage patient in therapeutic group addressing interpersonal concerns.  Short Term Goals: Engage patient in aftercare planning with referrals and resources, Increase ability to appropriately verbalize feelings, Facilitate acceptance of mental health diagnosis and concerns and Increase skills for wellness and recovery  Therapeutic Interventions: Assess for all discharge needs, 1 to 1 time with Social worker, Explore available resources and support systems, Assess for adequacy in community support network, Educate family and significant other(s) on  suicide prevention, Complete Psychosocial Assessment, Interpersonal group therapy.  Evaluation of Outcomes: Progressing    Recreational Therapy Treatment Plan for Primary Diagnosis: Schizoaffective disorder, bipolar type (HCC) Long Term Goal(s): Patient will participate in recreation therapy treatment in at least 2 group sessions without prompting from LRT  Short Term Goals: Increase anger management skills, Increase stress management skills  Treatment Modalities: Group Therapy and Individual Treatment Sessions  Therapeutic Interventions: Psychoeducation  Evaluation of Outcomes: Progressing   Progress in Treatment: Attending groups: Yes. Participating in groups: Yes. Taking medication as prescribed: Yes. Toleration medication: Yes. Family/Significant other contact made: Yes, individual(s) contacted:  legal guardian Patient understands diagnosis: Yes. Discussing patient identified problems/goals with staff: Yes. Medical problems stabilized or resolved: Yes. Denies suicidal/homicidal ideation: Yes. Issues/concerns per patient self-inventory: No. Other: n/a  New problem(s) identified: None identified at this time.   New Short Term/Long Term Goal(s): None identified at this time.   Discharge Plan or Barriers: Patient will discharge back to group home and follow-up with Assertive community treatment team services with [facility].   Reason for Continuation of Hospitalization: Depression Hallucinations Medication stabilization  Estimated Length of Stay: 5 to 7 days.   Attendees: Patient: Lance [name] 09/02/2016 9:58 AM  Physician: Dr. Radene JourneyJayme Cloud, MD 09/02/2016 9:58 AM  Nursing: Leonia Reader, RN 09/02/2016 9:58 AM  RN Care Manager: 09/02/2016 9:58 AM  Social Worker: Fredrich Birks. Garnette Czech MSW, LCSWA 09/02/2016 9:58 AM  Recreational Therapist: Jacquelynn Cree, LRT/CTRS 09/02/2016 9:58 AM  Other:  09/02/2016 9:58 AM  Other:  09/02/2016 9:58 AM  Other: 09/02/2016 9:58  AM    Scribe for Treatment Team: Arelia Longest, LCSWA 09/02/2016 2:28 PM

## 2016-09-02 NOTE — Plan of Care (Signed)
Problem: Activity: Goal: Sleeping patterns will improve Outcome: Not Met (add Reason) Slept 7.5. Flat sad affect.  Isolates in room except for medications. No voiced thoughts of hurting himself. Med compliant. No PRNs given. Safety maintained with q 15 min checks.

## 2016-09-02 NOTE — [facility] Group Notes (Signed)
  [facility] LCSW Group Therapy Note  Date/Time: 09/02/16, 1400  Type of Therapy/Topic:  Group Therapy:  Emotion Regulation  Participation Level:  Active   Mood: Pleasant  Description of Group:    The purpose of this group is to assist patients in learning to regulate negative emotions and experience positive emotions. Patients will be guided to discuss ways in which they have been vulnerable to their negative emotions. These vulnerabilities will be juxtaposed with experiences of positive emotions or situations, and patients challenged to use positive emotions to combat negative ones. Special emphasis will be placed on coping with negative emotions in conflict situations, and patients will process healthy conflict resolution skills.  Therapeutic Goals: 1. Patient will identify two positive emotions or experiences to reflect on in order to balance out negative emotions:  2. Patient will label two or more emotions that they find the most difficult to experience:  3. Patient will be able to demonstrate positive conflict resolution skills through discussion or role plays:   Summary of Patient Progress: Pt identified anger and fear as two emotions that are difficult for him to experience.  Pt made a number of contributions to the discussion in group, a number of which were mostly off topic of what was being discussed.  Pt is engaged and active, however,       Therapeutic Modalities:   Cognitive Behavioral Therapy Feelings Identification Dialectical Behavioral Therapy  Daleen SquibbGreg Rosanna Bickle, LCSW

## 2016-09-02 NOTE — Plan of Care (Signed)
Problem: [facility] Participation in Recreation Therapeutic Interventions Goal: STG-Patient will identify at least five coping skills for ** STG: Coping Skills - Within 4 treatment sessions, patient will verbalize at least 5 coping skills for anger in each of 2 treatment sessions to increase anger management skills.  Outcome: Progressing Treatment Session 1; Completed 1 out of 2: At approximately 3:35 pm, LRT met with patient in consultation room. Patient verbalized 5 coping skills for anger. Patient verbalized what triggers him to get angry, how his body responds to anger, and how he will remind himself to use his healthy coping skills. LRT provided suggestions as well.  Leonette Monarch, LRT/CTRS 03.21.18 3:50 pm Goal: STG-Other Recreation Therapy Goal (Specify) STG: Stress Management - Within 4 treatment sessions, patient will verbalize understanding of the stress management techniques in each of 2 treatment sessions to increase stress management skills.  Outcome: Progressing Treatment Session 1; Completed 1 out of 2: At approximately 3:35 pm, LRT met with patient in consultation room. LRT educated and provided patient with handouts on stress management techniques. Patient verbalized understanding of the stress management techniques. LRT encouraged patient to read over and practice the stress management techniques.  Leonette Monarch, LRT/CTRS 03.21.18 3:53 pm
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
## 2016-09-02 NOTE — Plan of Care (Signed)
Problem: Coping: Goal: Ability to cope will improve Outcome: Progressing Pt is progressing towards accomplishing this goal.   

## 2016-09-02 NOTE — Progress Notes (Signed)
Recreation Therapy Notes  Date: 03.21.18 Time: 9:30 am Location: Craft Room  Group Topic: Self-esteem  Goal Area(s) Addresses:  Patient will write at least one positive trait. Patient will write at least one healthy coping skill.  Behavioral Response: Did not attend  Intervention: All About Me  Activity: Patients were instructed to make an All About Me pamphlet including their life motto, positive traits, healthy coping skills, and their healthy support system.  Education: LRT educated patients on ways to increase their self-esteem.  Education Outcome: Patient did not attend group.  Clinical Observations/Feedback: Patient did not attend group.  Jacquelynn CreeGreene,Brizza Nathanson M, LRT/CTRS 09/02/2016 10:01 AM

## 2016-09-03 LAB — GLUCOSE, CAPILLARY
GLUCOSE-CAPILLARY: 90 mg/dL (ref 65–99)
Glucose-Capillary: 106 mg/dL — ABNORMAL HIGH (ref 65–99)
Glucose-Capillary: 84 mg/dL (ref 65–99)

## 2016-09-03 LAB — PROTIME-INR
INR: 1.84
Prothrombin Time: 21.5 seconds — ABNORMAL HIGH (ref 11.4–15.2)

## 2016-09-03 MED ORDER — LORAZEPAM 1 MG PO TABS: 1.0000 mg | ORAL_TABLET | Freq: Every day | ORAL | Status: DC

## 2016-09-03 MED ORDER — LORAZEPAM 0.5 MG PO TABS: 0.5000 mg | ORAL_TABLET | Freq: Every day | ORAL | Status: DC

## 2016-09-03 MED ORDER — LORAZEPAM 2 MG PO TABS
2.0000 mg | ORAL_TABLET | Freq: Every day | ORAL | Status: DC
  Administered 2016-09-03: 2 mg via ORAL
  Filled 2016-09-03: qty 1

## 2016-09-03 MED ORDER — LORAZEPAM 2 MG PO TABS
2.0000 mg | ORAL_TABLET | Freq: Every day | ORAL | Status: DC
Start: 2016-09-03 — End: 2016-09-03

## 2016-09-03 MED ORDER — WARFARIN SODIUM 4 MG PO TABS
8.0000 mg | ORAL_TABLET | Freq: Every day | ORAL | Status: DC
  Administered 2016-09-03 – 2016-09-06 (×4): 8 mg via ORAL
  Filled 2016-09-03 (×5): qty 2

## 2016-09-03 NOTE — Progress Notes (Signed)
Pt's walked up and down the hallway most evening but no sign of aggression. Pt remains compliant with medication regimen, voices no concerns, will continue to monitor closely for safety.

## 2016-09-03 NOTE — Plan of Care (Signed)
Problem: Coping: Goal: Ability to cope will improve Outcome: Progressing Working on coping skills , handout given   

## 2016-09-03 NOTE — Consult Note (Signed)
ANTICOAGULATION CONSULT NOTE -  Pharmacy Consult for warfarin Indication: Hx of PE  Allergies  Allergen Reactions  . Penicillins     Patient Measurements: Height: 5\' 10"  (177.8 cm) Weight: 210 lb (95.3 kg) IBW/kg (Calculated) : 73 Heparin Dosing Weight:   Vital Signs: Temp: 97.1 F (36.2 C) (03/22 0712) Temp Source: Oral (03/22 0712) BP: 137/84 (03/22 0712) Pulse Rate: 67 (03/22 0712)  Labs:  Recent Labs  08/31/16 1012  08/31/16 1853 09/01/16 1530 09/02/16 0644 09/03/16 0656  HGB 16.0  --  15.2  --   --   --   HCT 45.1  --  44.0  --   --   --   PLT 280  --  230  --   --   --   LABPROT  --   < > 16.0* 18.7* 20.5* 21.5*  INR  --   < > 1.27 1.55 1.73 1.84  CREATININE 1.03  --   --   --   --   --   < > = values in this interval not displayed.  Estimated Creatinine Clearance: 108.2 mL/min (by C-G formula based on SCr of 1.03 mg/dL).   Medical History: Past Medical History:  Diagnosis Date  . Depression   . Diabetes mellitus without complication ([facility])   . Hyperlipidemia   . Hypertension   . PE (pulmonary thromboembolism) ([facility])   . Schizo affective schizophrenia ([facility])     Medications:  Scheduled:  . buPROPion  150 mg Oral Daily  . cloZAPine  450 mg Oral QHS  . docusate sodium  200 mg Oral BID  . insulin aspart  0-15 Units Subcutaneous TID WC  . lithium carbonate  450 mg Oral Q12H  . LORazepam  1 mg Oral QHS  . metFORMIN  1,000 mg Oral BID WC  . mometasone-formoterol  2 puff Inhalation BID  . nicotine  21 mg Transdermal Daily  . polyethylene glycol  17 g Oral Daily  . simvastatin  40 mg Oral q1800  . Warfarin - Pharmacist Dosing Inpatient   Does not apply q1800    Assessment: Pt is a 41 year old male who presents from his group home with N/V/D and visual hallucinations. Pt has a PMH of schizoaffective disorder bipolar type and PE on warfarin. INR on admission is low at 1.27. Group home reports that they give the patient 12.5mg  alternating with 2.5mg .  Called the cardiologist office who states that his INR in the beginning of Feb was 1.2. He was instructed to take 10mg  daily and on recheck on 28 Feb INR was 2.5. He was told to continue to take this dose. Of note, office states that the group home has a hx of not bringing pt to appointments. Prior to Oct 2017 visit, pt had not been seen since march of 2017 for INR check. At pt last hospital stay pharmacy was dosing pt warfarin at 7.5mg  daily- INR was just therapeutic after about a week.  3/19  INR 1.27  Warfarin 8mg  3/20  INR 1.55  8 mg 3/21  INR 1.73  8 mg 3/22  INR 1.84   Goal of Therapy:  INR 2-3 Monitor platelets by anticoagulation protocol: Yes   Plan:  3/20- Since we are unsure of patients actually home dose and if he was even really taking warfarin we will attempt to find a good dose for patient. I will continue with 8mg  tonight and recheck INR in the AM.  3/22- Will continue with Warfarin 8  mg at this time.  F/U INR in am.    Stormy Card, [facility] Clinical Pharmacist 09/03/2016

## 2016-09-03 NOTE — Plan of Care (Signed)
Problem: [facility] Participation in Recreation Therapeutic Interventions Goal: STG-Patient will identify at least five coping skills for ** STG: Coping Skills - Within 4 treatment sessions, patient will verbalize at least 5 coping skills for anger in each of 2 treatment sessions to increase anger management skills.  Outcome: Completed/Met Date Met: 09/03/16 Treatment Session 2; Completed 2 out of 2: At approximately 2:30 pm, LRT met with patient in patient room. Patient verbalized 5 coping skills for anger. LRT encouraged patient to use his coping skills to help him calm down when he felt himself getting angry.  Leonette Monarch, LRT/CTRS 03.22.18 2:41 pm Goal: STG-Other Recreation Therapy Goal (Specify) STG: Stress Management - Within 4 treatment sessions, patient will verbalize understanding of the stress management techniques in each of 2 treatment sessions to increase stress management skills.  Outcome: Completed/Met Date Met: 09/03/16 Treatment Session 2; Completed 2 out of 2: At approximately 2:30 pm, LRT met with patient in patient room. Patient verbalized understanding. LRT encouraged patient to use the stress management techniques when he felt himself getting overwhelmed or stressed.  Leonette Monarch, LRT/CTRS 03.22.18 2:42 pm

## 2016-09-03 NOTE — [facility] Group Notes (Signed)
[facility] LCSW Group Therapy Note  Date/Time: 09/03/16, 1300  Type of Therapy/Topic:  Group Therapy:  Balance in Life  Participation Level: Pt did not attend group.   Description of Group:    This group will address the concept of balance and how it feels and looks when one is unbalanced. Patients will be encouraged to process areas in their lives that are out of balance, and identify reasons for remaining unbalanced. Facilitators will guide patients utilizing problem- solving interventions to address and correct the stressor making their life unbalanced. Understanding and applying boundaries will be explored and addressed for obtaining  and maintaining a balanced life. Patients will be encouraged to explore ways to assertively make their unbalanced needs known to significant others in their lives, using other group members and facilitator for support and feedback.  Therapeutic Goals: 1. Patient will identify two or more emotions or situations they have that consume much of in their lives. 2. Patient will identify signs/triggers that life has become out of balance:  3. Patient will identify two ways to set boundaries in order to achieve balance in their lives:  4. Patient will demonstrate ability to communicate their needs through discussion and/or role plays  Summary of Patient Progress:          Therapeutic Modalities:   Cognitive Behavioral Therapy Solution-Focused Therapy Assertiveness Training  Greg Tavie Haseman, LCSW 

## 2016-09-03 NOTE — Progress Notes (Signed)
D:Patient pacing halls during shift Affect flat  And depressed mood. Isolates to room  During shift. Patient stated slept good last night .Stated appetite is good and energy level  Is normal. Stated concentration poor. Stated on Depression scale 4 , hopeless  4 and anxiety  4.( low 0-10 high) Denies suicidal  homicidal ideations  .  Voice of no  auditory hallucinations.  No pain concerns . Appropriate ADL'S. Interacting with peers and staff.  A: Encourage patient participation with unit programming . Instruction  Given on  Medication , verbalize understanding. Voice of  Focus being " To Get Better"  R: Voice no other concerns. Staff continue to monitor.

## 2016-09-03 NOTE — Progress Notes (Signed)
Recreation Therapy Notes  Date: 03.22.18 Time: 9:30 am Location: Craft Room  Group Topic: Leisure Education  Goal Area(s) Addresses:  Patient will identify things they are grateful for. Patient will identify how being grateful can influence decision-making.  Behavioral Response: Arrived late, Attentive  Intervention: Grateful Wheel  Activity: Patients were given an I Am Grateful For worksheet and were instructed to write things they were grateful for under each category.  Education: LRT educated patients on why it is important to be grateful.  Education Outcome: In group clarification offered   Clinical Observations/Feedback: Patient arrived to group at approximately 9:55 am. Patient listened for the rest of group.  Jacquelynn CreeGreene,Gerrard Crystal M, LRT/CTRS 09/03/2016 10:07 AM
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
## 2016-09-04 LAB — GLUCOSE, CAPILLARY
GLUCOSE-CAPILLARY: 109 mg/dL — AB (ref 65–99)
GLUCOSE-CAPILLARY: 109 mg/dL — AB (ref 65–99)
Glucose-Capillary: 106 mg/dL — ABNORMAL HIGH (ref 65–99)

## 2016-09-04 LAB — PROTIME-INR
INR: 2.06
Prothrombin Time: 23.5 seconds — ABNORMAL HIGH (ref 11.4–15.2)

## 2016-09-04 MED ORDER — IPRATROPIUM BROMIDE 0.06 % NA SOLN: 1.0000 | Freq: Every day | NASAL | Status: DC

## 2016-09-04 MED ORDER — LORAZEPAM 2 MG PO TABS
2.0000 mg | ORAL_TABLET | Freq: Every day | ORAL | Status: DC
  Administered 2016-09-04 – 2016-09-08 (×5): 2 mg via ORAL
  Filled 2016-09-04 (×5): qty 1

## 2016-09-04 MED ORDER — SIMVASTATIN 40 MG PO TABS
40.0000 mg | ORAL_TABLET | Freq: Every day | ORAL | Status: DC
  Administered 2016-09-05 – 2016-09-13 (×10): 40 mg via ORAL
  Filled 2016-09-04 (×9): qty 1

## 2016-09-04 MED ORDER — IPRATROPIUM BROMIDE 0.06 % NA SOLN
1.0000 | Freq: Every day | NASAL | Status: DC
  Administered 2016-09-05 – 2016-09-10 (×4): 1 via NASAL
  Filled 2016-09-04 (×3): qty 15

## 2016-09-04 MED ORDER — DOCUSATE SODIUM 100 MG PO CAPS
200.0000 mg | ORAL_CAPSULE | Freq: Two times a day (BID) | ORAL | Status: DC
  Administered 2016-09-04 – 2016-09-14 (×20): 200 mg via ORAL
  Filled 2016-09-04 (×20): qty 2

## 2016-09-04 MED ORDER — CLOZAPINE 25 MG PO TABS
450.0000 mg | ORAL_TABLET | Freq: Every day | ORAL | Status: DC
  Administered 2016-09-04 – 2016-09-13 (×10): 450 mg via ORAL
  Filled 2016-09-04 (×9): qty 2

## 2016-09-04 NOTE — [facility] Group Notes (Signed)
[facility] LCSW Group Therapy Note  Date/Time: 09/04/16, 1300  Type of Therapy and Topic:  Group Therapy:  Feelings around Relapse and Recovery  Participation Level:  Did Not Attend   Mood:  Description of Group:    Patients in this group will discuss emotions they experience before and after a relapse. They will process how experiencing these feelings, or avoidance of experiencing them, relates to having a relapse. Facilitator will guide patients to explore emotions they have related to recovery. Patients will be encouraged to process which emotions are more powerful. They will be guided to discuss the emotional reaction significant others in their lives may have to patients' relapse or recovery. Patients will be assisted in exploring ways to respond to the emotions of others without this contributing to a relapse.  Therapeutic Goals: 1. Patient will identify two or more emotions that lead to relapse for them:  2. Patient will identify two emotions that result when they relapse:  3. Patient will identify two emotions related to recovery:  4. Patient will demonstrate ability to communicate their needs through discussion and/or role plays.   Summary of Patient Progress:     Therapeutic Modalities:   Cognitive Behavioral Therapy Solution-Focused Therapy Assertiveness Training Relapse Prevention Therapy  Daleen SquibbGreg Selby Foisy, LCSW

## 2016-09-04 NOTE — Tx Team (Signed)
Interdisciplinary Treatment and Diagnostic Plan Update  09/04/2016 Time of Session: 11:00am Lance [name] MRN: [id number]  Principal Diagnosis: Schizoaffective disorder, bipolar type [facility])  Secondary Diagnoses: Principal Problem:   Schizoaffective disorder, bipolar type (HCC) Active Problems:   Hypertension   Diabetes mellitus without complication (HCC)   Hyperlipidemia   History of pulmonary embolism   Chronic anticoagulation   Tobacco use disorder   Current Medications:  Current Facility-Administered Medications  Medication Dose Route Frequency Provider Last Rate Last Dose  . acetaminophen (TYLENOL) tablet 650 mg  650 mg Oral Q6H PRN Audery Amel, MD      . alum & mag hydroxide-simeth (MAALOX/MYLANTA) 200-200-20 MG/5ML suspension 30 mL  30 mL Oral Q4H PRN Audery Amel, MD      . buPROPion (WELLBUTRIN XL) 24 hr tablet 150 mg  150 mg Oral Daily Jimmy Footman, MD   150 mg at 09/04/16 0826  . cloZAPine (CLOZARIL) tablet 450 mg  450 mg Oral QHS Jimmy Footman, MD      . docusate sodium (COLACE) capsule 200 mg  200 mg Oral BID Jimmy Footman, MD      . ipratropium (ATROVENT) 0.06 % nasal spray 1 spray  1 spray Nasal QHS Jimmy Footman, MD      . lithium carbonate (ESKALITH) CR tablet 450 mg  450 mg Oral Q12H Jimmy Footman, MD   450 mg at 09/04/16 0831  . LORazepam (ATIVAN) tablet 2 mg  2 mg Oral QHS Jimmy Footman, MD      . magnesium hydroxide (MILK OF MAGNESIA) suspension 30 mL  30 mL Oral Daily PRN Audery Amel, MD      . metFORMIN (GLUCOPHAGE-XR) 24 hr tablet 1,000 mg  1,000 mg Oral BID WC Jimmy Footman, MD   1,000 mg at 09/04/16 0826  . mometasone-formoterol (DULERA) 200-5 MCG/ACT inhaler 2 puff  2 puff Inhalation BID Audery Amel, MD   2 puff at 09/03/16 2100  . nicotine (NICODERM CQ - dosed in mg/24 hours) patch 21 mg  21 mg Transdermal Daily Jimmy Footman, MD   21 mg at 09/03/16  0835  . polyethylene glycol (MIRALAX / GLYCOLAX) packet 17 g  17 g Oral Daily Jimmy Footman, MD   17 g at 09/04/16 4696  . simvastatin (ZOCOR) tablet 40 mg  40 mg Oral q1800 Jimmy Footman, MD      . warfarin (COUMADIN) tablet 8 mg  8 mg Oral q1800 Jimmy Footman, MD   8 mg at 09/03/16 1732  . Warfarin - Pharmacist Dosing Inpatient   Does not apply q1800 Jimmy Footman, MD       PTA Medications: Prescriptions Prior to Admission  Medication Sig Dispense Refill Last Dose  . cloZAPine (CLOZARIL) 100 MG tablet Take 100-300 mg by mouth 2 (two) times daily. 100mg  in the morning and 300mg  at bedtime   unknown at unknown  . Fluticasone-Salmeterol (ADVAIR) 250-50 MCG/DOSE AEPB Inhale 1 puff into the lungs 2 (two) times daily.   unknown at unknown  . lithium carbonate 300 MG capsule Take 900 mg by mouth at bedtime.   unknown at unknown  . loperamide (IMODIUM A-D) 2 MG tablet Take 1 tablet (2 mg total) by mouth 4 (four) times daily as needed for diarrhea or loose stools. 12 tablet 0 prn at prn  . metFORMIN (GLUCOPHAGE) 1000 MG tablet Take 1,000 mg by mouth 2 (two) times daily with a meal.   unknown at unknown  . omega-3 acid ethyl esters (LOVAZA) 1  g capsule Take 1 g by mouth daily.   unknown at unknown  . ondansetron (ZOFRAN ODT) 4 MG disintegrating tablet Take 1 tablet (4 mg total) by mouth every 8 (eight) hours as needed for nausea or vomiting. 20 tablet 0 prn at prn  . PARoxetine (PAXIL-CR) 25 MG 24 hr tablet Take 25 mg by mouth daily.   unknown at unknown  . simvastatin (ZOCOR) 40 MG tablet Take 40 mg by mouth at bedtime.   unknown at unknown  . traZODone (DESYREL) 100 MG tablet Take 150 mg by mouth at bedtime.    unknown at unknown  . warfarin (COUMADIN) 10 MG tablet Take 10 mg by mouth every other day.   unknown at unknown  . ziprasidone (GEODON) 80 MG capsule Take 80 mg by mouth 2 (two) times daily with a meal.   unknown at unknown    Patient  Stressors: Health problems Medication change or noncompliance  Patient Strengths: Motivation for treatment/growth Physical Health  Treatment Modalities: Medication Management, Group therapy, Case management,  1 to 1 session with clinician, Psychoeducation, Recreational therapy.   Physician Treatment Plan for Primary Diagnosis: Schizoaffective disorder, bipolar type (HCC) Long Term Goal(s): Improvement in symptoms so as ready for discharge Improvement in symptoms so as ready for discharge   Short Term Goals: Ability to identify and develop effective coping behaviors will improve Ability to identify triggers associated with substance abuse/mental health issues will improve Ability to identify changes in lifestyle to reduce recurrence of condition will improve Ability to verbalize feelings will improve  Medication Management: Evaluate patient's response, side effects, and tolerance of medication regimen.  Therapeutic Interventions: 1 to 1 sessions, Unit Group sessions and Medication administration.  Evaluation of Outcomes: Progressing  Physician Treatment Plan for Secondary Diagnosis: Principal Problem:   Schizoaffective disorder, bipolar type (HCC) Active Problems:   Hypertension   Diabetes mellitus without complication (HCC)   Hyperlipidemia   History of pulmonary embolism   Chronic anticoagulation   Tobacco use disorder  Long Term Goal(s): Improvement in symptoms so as ready for discharge Improvement in symptoms so as ready for discharge   Short Term Goals: Ability to identify and develop effective coping behaviors will improve Ability to identify triggers associated with substance abuse/mental health issues will improve Ability to identify changes in lifestyle to reduce recurrence of condition will improve Ability to verbalize feelings will improve     Medication Management: Evaluate patient's response, side effects, and tolerance of medication regimen.  Therapeutic  Interventions: 1 to 1 sessions, Unit Group sessions and Medication administration.  Evaluation of Outcomes: Progressing   RN Treatment Plan for Primary Diagnosis: Schizoaffective disorder, bipolar type (HCC) Long Term Goal(s): Knowledge of disease and therapeutic regimen to maintain health will improve  Short Term Goals: Ability to remain free from injury will improve, Ability to demonstrate self-control, Ability to disclose and discuss suicidal ideas, Ability to identify and develop effective coping behaviors will improve and Compliance with prescribed medications will improve  Medication Management: RN will administer medications as ordered by provider, will assess and evaluate patient's response and provide education to patient for prescribed medication. RN will report any adverse and/or side effects to prescribing provider.  Therapeutic Interventions: 1 on 1 counseling sessions, Psychoeducation, Medication administration, Evaluate responses to treatment, Monitor vital signs and CBGs as ordered, Perform/monitor CIWA, COWS, AIMS and Fall Risk screenings as ordered, Perform wound care treatments as ordered.  Evaluation of Outcomes: Progressing   LCSW Treatment Plan for Primary Diagnosis: Schizoaffective disorder,  bipolar type [facility]) Long Term Goal(s): Safe transition to appropriate next level of care at discharge, Engage patient in therapeutic group addressing interpersonal concerns.  Short Term Goals: Engage patient in aftercare planning with referrals and resources, Increase ability to appropriately verbalize feelings, Facilitate acceptance of mental health diagnosis and concerns and Increase skills for wellness and recovery  Therapeutic Interventions: Assess for all discharge needs, 1 to 1 time with Social worker, Explore available resources and support systems, Assess for adequacy in community support network, Educate family and significant other(s) on suicide prevention, Complete  Psychosocial Assessment, Interpersonal group therapy.  Evaluation of Outcomes: Progressing   Progress in Treatment: Attending groups: Yes. Participating in groups: Yes.  Taking medication as prescribed: Yes. Toleration medication: Yes. Family/Significant other contact made: Yes, individual(s) contacted:  legal guardian Patient understands diagnosis: Yes. Discussing patient identified problems/goals with staff: Yes. Medical problems stabilized or resolved: Yes. Denies suicidal/homicidal ideation: Yes. Issues/concerns per patient self-inventory: No. Other: n/a  New problem(s) identified: None identified at this time.   New Short Term/Long Term Goal(s): None identified at this time.   Discharge Plan or Barriers: Patient will discharge back to group home and follow-up with [facility].   Reason for Continuation of Hospitalization: Hallucinations Medication stabilization  Estimated Length of Stay: 7 days.   Attendees: Patient: 09/04/2016 1:22 PM  Physician: Dr. Radene JourneyJayme Cloud, MD 09/04/2016 1:22 PM  Nursing: Leonarda Salon, RN 09/04/2016 1:22 PM  RN Care Manager: 09/04/2016 1:22 PM  Social Worker: Fredrich Birks. Garnette Czech MSW, LCSWA 09/04/2016 1:22 PM  Recreational Therapist:  09/04/2016 1:22 PM  Other:  09/04/2016 1:22 PM  Other:  09/04/2016 1:22 PM  Other: 09/04/2016 1:22 PM    Scribe for Treatment Team: Arelia Longest, LCSWA 09/04/2016 1:25 PM
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
## 2016-09-04 NOTE — [facility] Group Notes (Signed)
[facility] Group Notes:  (Nursing/MHT/Case Management/Adjunct)  Date:  09/04/2016  Time:  4:31 AM  Type of Therapy:  Psychoeducational Skills  Participation Level:  Active  Participation Quality:  Appropriate and Attentive  Affect:  Appropriate  Cognitive:  Alert and Appropriate  Insight:  Appropriate  Engagement in Group:  Engaged  Modes of Intervention:  Discussion, Socialization and Support  Summary of Progress/Problems:  Lance MilroyLaquanda Y Shatima [name] 09/04/2016, 4:31 AM

## 2016-09-04 NOTE — Progress Notes (Signed)
D: Pt denies SI/HI/AVH, patient is alert and oriented x 4,  Pt is pleasant and cooperative, affect is flat. Patient thoughts are organized, noted pacing on the unit and after a while he stopped and went into the dayroom to interact with peers. Patient appears less anxious, minimal interaction with peers and staff .  A: Pt was offered support and encouragement. Pt was given scheduled medications. Pt was encouraged to attend groups. Q 15 minute checks were done for safety.  R:Pt attends groups and interacts well with peers and staff. Pt is taking medication. Pt has no complaints.Pt receptive to treatment and safety maintained on unit.

## 2016-09-04 NOTE — Progress Notes (Addendum)
Patient verbalized that he is depressed.Denies suicidal or homicidal ideations & AV hallucinations.Patient is isolated in room with d depressed affect.Minimal interactions with peers & staff.Refused to go for groups.Compliant with medications.Appetite & energy level good.Support & encouragement given.

## 2016-09-04 NOTE — Consult Note (Signed)
ANTICOAGULATION CONSULT NOTE -  Pharmacy Consult for warfarin Indication: Hx of PE  Allergies  Allergen Reactions  . Penicillins     Patient Measurements: Height: 5\' 10"  (177.8 cm) Weight: 210 lb (95.3 kg) IBW/kg (Calculated) : 73 Heparin Dosing Weight:   Vital Signs: Temp: 97.4 F (36.3 C) (03/23 0700) Temp Source: Oral (03/23 0700) BP: 126/66 (03/23 0700) Pulse Rate: 79 (03/23 0700)  Labs:  Recent Labs  09/02/16 0644 09/03/16 0656 09/04/16 0649  LABPROT 20.5* 21.5* 23.5*  INR 1.73 1.84 2.06    Estimated Creatinine Clearance: 108.2 mL/min (by C-G formula based on SCr of 1.03 mg/dL).   Medical History: Past Medical History:  Diagnosis Date  . Depression   . Diabetes mellitus without complication ([facility])   . Hyperlipidemia   . Hypertension   . PE (pulmonary thromboembolism) ([facility])   . Schizo affective schizophrenia ([facility])     Medications:  Scheduled:  . buPROPion  150 mg Oral Daily  . cloZAPine  450 mg Oral QHS  . docusate sodium  200 mg Oral BID  . insulin aspart  0-15 Units Subcutaneous TID WC  . lithium carbonate  450 mg Oral Q12H  . LORazepam  2 mg Oral QHS  . metFORMIN  1,000 mg Oral BID WC  . mometasone-formoterol  2 puff Inhalation BID  . nicotine  21 mg Transdermal Daily  . polyethylene glycol  17 g Oral Daily  . simvastatin  40 mg Oral q1800  . warfarin  8 mg Oral q1800  . Warfarin - Pharmacist Dosing Inpatient   Does not apply q1800    Assessment: Pt is a 41 year old male who presents from his group home with N/V/D and visual hallucinations. Pt has a PMH of schizoaffective disorder bipolar type and PE on warfarin. INR on admission is low at 1.27. Group home reports that they give the patient 12.5mg  alternating with 2.5mg . Called the cardiologist office who states that his INR in the beginning of Feb was 1.2. He was instructed to take 10mg  daily and on recheck on 28 Feb INR was 2.5. He was told to continue to take this dose. Of note, office states  that the group home has a hx of not bringing pt to appointments. Prior to Oct 2017 visit, pt had not been seen since march of 2017 for INR check. At pt last hospital stay pharmacy was dosing pt warfarin at 7.5mg  daily- INR was just therapeutic after about a week.  3/19  INR 1.27  Warfarin 8mg  3/20  INR 1.55  8 mg 3/21  INR 1.73  8 mg 3/22  INR 1.84  8 mg 3/23  INR 2.06   Goal of Therapy:  INR 2-3 Monitor platelets by anticoagulation protocol: Yes   Plan:  3/20- Since we are unsure of patients actually home dose and if he was even really taking warfarin we will attempt to find a good dose for patient. I will continue with 8mg  tonight and recheck INR in the AM.  3/22- Will continue with Warfarin 8 mg at this time.  F/U INR in am.    Stormy CardKatsoudas,Kaniel Kiang K, Geisinger Medical CenterRPH Clinical Pharmacist 09/04/2016

## 2016-09-05 LAB — GLUCOSE, CAPILLARY: GLUCOSE-CAPILLARY: 130 mg/dL — AB (ref 65–99)

## 2016-09-05 NOTE — Progress Notes (Signed)
D:Angry affect this am  Shift  Noted to get better as day progressed . Encourage to get out of bed  For meals . Appetite good  . Stated he is not having any auditory  hallucinations . Patient was incontinent  Of urine this shift . Patient took  Air traffic controllerhower. Staff assisted with bed with change.   Continue to wear  ,scrubs .  Noted to pace later in shift . Patient slept  For most of shift .Stated concentration is good . Stated on Depression scale  3 , hopeless 3 and anxiety 3 .( low 0-10 high) Denies suicidal  homicidal ideations  .    No pain concerns . Limited  Interacting with peers and staff.  A: Encourage patient participation with unit programming . Instruction  Given on  Medication , verbalize understanding. R: Voice no other concerns. Staff continue to monitor

## 2016-09-05 NOTE — [facility] Group Notes (Signed)
[facility] LCSW Group Therapy  09/05/2016 2:15 PM  Type of Therapy:  Group Therapy  Participation Level:  Patient did not attend group. CSW invited patient to group.   Summary of Progress/Problems: Goal Setting: The objective is to set goals as they relate to the crisis in which they were admitted. Patients will be using SMART goal modalities to set measurable goals. Characteristics of realistic goals will be discussed and patients will be assisted in setting and processing how one will reach their goal. Facilitator will also assist patients in applying interventions and coping skills learned in psycho-education groups to the SMART goal and process how one will achieve defined goal.  Lance [name] G. Garnette CzechSampson MSW, LCSWA 09/05/2016, 2:16 PM

## 2016-09-05 NOTE — Progress Notes (Signed)
Patient ID: Lance AlbrightJames P [name], male   DOB: Nov 12, 1975, 41 y.o.   MRN: [id number]  Patient stated he was still interested in the [employer] Referral for tobacco use. CSW faxed referral form to [employer].  [place] Pelto G. Garnette CzechSampson MSW, [facility] WestCSWA 09/05/2016 4:19 PM
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
## 2016-09-05 NOTE — Plan of Care (Signed)
Problem: Coping: Goal: Ability to verbalize frustrations and anger appropriately will improve Outcome: Progressing Working on coping skills    

## 2016-09-06 LAB — LITHIUM LEVEL: Lithium Lvl: 0.78 mmol/L (ref 0.60–1.20)

## 2016-09-06 LAB — GLUCOSE, CAPILLARY: GLUCOSE-CAPILLARY: 118 mg/dL — AB (ref 65–99)

## 2016-09-06 LAB — PROTIME-INR
INR: 2.76
Prothrombin Time: 29.7 seconds — ABNORMAL HIGH (ref 11.4–15.2)

## 2016-09-06 NOTE — Progress Notes (Signed)
Patient stated "I feel the same".Rated his depression 4/10.Denies suicidal or homicidal ideations.Stated that he hear some voices but not bothering him now.Isolated in the room & minimal interactions with peers & staff.Compliant with medications.Appetite & energy level good.Did not attend groups.Support & encouragement given.

## 2016-09-06 NOTE — Consult Note (Signed)
ANTICOAGULATION CONSULT NOTE -  Pharmacy Consult for warfarin Indication: Hx of PE  Allergies  Allergen Reactions  . Penicillins     Patient Measurements: Height: 5\' 10"  (177.8 cm) Weight: 210 lb (95.3 kg) IBW/kg (Calculated) : 73  Vital Signs: BP: 135/84 (03/25 0659) Pulse Rate: 73 (03/25 0659)  Labs:  Recent Labs  09/04/16 0649 09/06/16 0858  LABPROT 23.5* 29.7*  INR 2.06 2.76    Estimated Creatinine Clearance: 108.2 mL/min (by C-G formula based on SCr of 1.03 mg/dL).   Medical History: Past Medical History:  Diagnosis Date  . Depression   . Diabetes mellitus without complication (HCC)   . Hyperlipidemia   . Hypertension   . PE (pulmonary thromboembolism) (HCC)   . Schizo affective schizophrenia (HCC)     Medications:  Scheduled:  . buPROPion  150 mg Oral Daily  . cloZAPine  450 mg Oral QHS  . docusate sodium  200 mg Oral BID  . ipratropium  1 spray Nasal QHS  . lithium carbonate  450 mg Oral Q12H  . LORazepam  2 mg Oral QHS  . metFORMIN  1,000 mg Oral BID WC  . mometasone-formoterol  2 puff Inhalation BID  . nicotine  21 mg Transdermal Daily  . polyethylene glycol  17 g Oral Daily  . simvastatin  40 mg Oral q1800  . warfarin  8 mg Oral q1800  . Warfarin - Pharmacist Dosing Inpatient   Does not apply q1800    Assessment: Pt is a 41 year old male who presents from his group home with N/V/D and visual hallucinations. Pt has a PMH of schizoaffective disorder bipolar type and PE on warfarin. INR on admission is low at 1.27. Group home reports that they give the patient 12.5mg  alternating with 2.5mg . Called the cardiologist office who states that his INR in the beginning of Feb was 1.2. He was instructed to take 10mg  daily and on recheck on 28 Feb INR was 2.5. He was told to continue to take this dose. Of note, office states that the group home has a hx of not bringing pt to appointments. Prior to Oct 2017 visit, pt had not been seen since march of 2017 for  INR check. At pt last hospital stay pharmacy was dosing pt warfarin at 7.5mg  daily- INR was just therapeutic after about a week.  3/19  INR 1.27  Warfarin 8mg  3/20  INR 1.55  8 mg 3/21  INR 1.73  8 mg 3/22  INR 1.84  8 mg 3/23  INR 2.06  8 mg 3/24                  8 mg  3/25  INR 2.76   Goal of Therapy:  INR 2-3 Monitor platelets by anticoagulation protocol: Yes   Plan:  Continue with 8mg  tonight and recheck INR in the AM.  Stormy CardKatsoudas,Lotus Santillo K, [facility] CenterRPH Clinical Pharmacist 09/06/2016

## 2016-09-06 NOTE — [facility] Group Notes (Signed)
[facility] LCSW Group Therapy  09/06/2016 2:25 PM  Type of Therapy:  Group Therapy  Participation Level:  Patient did not attend group. CSW invited patient to group.   Summary of Progress/Problems: Stress management: Patients defined and discussed the topic of stress and the related symptoms and triggers for stress. Patients identified healthy coping skills they would like to try during hospitalization and after discharge to manage stress in a healthy way. CSW offered insight to varying stress management techniques.  Shanae Luo G. Garnette CzechSampson MSW, LCSWA 09/06/2016, 2:28 PM

## 2016-09-06 NOTE — Progress Notes (Signed)
Progress Notes Date of Service: 09/06/2016 4:28 AM Dwan BoltMargaret Gaye Scorza, RN  Other    [] Hide copied text [] Hover for attribution information Patient stated that; he had a better day today; has been seclusive to his room; denies hearing voices; denies having suicidal/homicidal ideations. Patient says that; he is looking forward to getting discharged on Wednesday; admits to having had a BM today.    Electronically signed by Dwan BoltMargaret Keona Sheffler, RN at 09/06/2016 4:34 AM      Admission (Current) on 09/04/2016        Detailed Report

## 2016-09-06 NOTE — [facility] Group Notes (Signed)
[facility] Group Notes:  (Nursing/MHT/Case Management/Adjunct)  Date:  09/06/2016  Time:  2:29 AM  Type of Therapy:  Psychoeducational Skills  Participation Level:  Active  Participation Quality:  Appropriate, Attentive and Sharing  Affect:  Appropriate  Cognitive:  Appropriate  Insight:  Appropriate and Good  Engagement in Group:  Engaged  Modes of Intervention:  Discussion, Socialization and Support  Summary of Progress/Problems:  Lance MilroyLaquanda Y Jalani [name] 09/06/2016, 2:29 AM

## 2016-09-06 NOTE — Progress Notes (Signed)
Chaplain received an order to visit with pt in room 306. Pt was discharging soon to a assisted living facility. Provided the ministry of prayer and emotional support.    09/06/16 1400  Clinical Encounter Type  Visited With Patient  Visit Type Initial;Spiritual support  Referral From Nurse  Consult/Referral To Chaplain  Spiritual Encounters  Spiritual Needs Prayer;Emotional
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
## 2016-09-07 DIAGNOSIS — Z79899 Other long term (current) drug therapy: Secondary | ICD-10-CM

## 2016-09-07 DIAGNOSIS — F17210 Nicotine dependence, cigarettes, uncomplicated: Secondary | ICD-10-CM

## 2016-09-07 LAB — GLUCOSE, CAPILLARY: Glucose-Capillary: 114 mg/dL — ABNORMAL HIGH (ref 65–99)

## 2016-09-07 LAB — PROTIME-INR
INR: 3.42
Prothrombin Time: 35.3 seconds — ABNORMAL HIGH (ref 11.4–15.2)

## 2016-09-07 LAB — CBC WITH DIFFERENTIAL/PLATELET
Basophils Absolute: 0 10*3/uL (ref 0–0.1)
Basophils Relative: 1 %
Eosinophils Absolute: 0 10*3/uL (ref 0–0.7)
Eosinophils Relative: 0 %
HEMATOCRIT: 44.6 % (ref 40.0–52.0)
Hemoglobin: 15.6 g/dL (ref 13.0–18.0)
LYMPHS ABS: 1.6 10*3/uL (ref 1.0–3.6)
Lymphocytes Relative: 27 %
MCH: 31.5 pg (ref 26.0–34.0)
MCHC: 34.9 g/dL (ref 32.0–36.0)
MCV: 90.5 fL (ref 80.0–100.0)
MONO ABS: 0.7 10*3/uL (ref 0.2–1.0)
MONOS PCT: 12 %
NEUTROS ABS: 3.5 10*3/uL (ref 1.4–6.5)
Neutrophils Relative %: 60 %
Platelets: 218 10*3/uL (ref 150–440)
RBC: 4.94 MIL/uL (ref 4.40–5.90)
RDW: 13.3 % (ref 11.5–14.5)
WBC: 5.8 10*3/uL (ref 3.8–10.6)

## 2016-09-07 NOTE — Progress Notes (Signed)
Recreation Therapy Notes  Date: 03.26.18 Time: 9:30 am Location: Craft Room  Group Topic: Coping Skills  Goal Area(s) Addresses:  Patient will participate in healthy coping skill. Patient will verbalize benefit of using art as a coping skill.  Behavioral Response: Did not attend  Intervention: Coloring  Activity: Patients were given coloring sheets to color and were instructed to think about the emotions they were feeling and what their minds were focused on.  Education: LRT educated patients on healthy coping skills.  Education Outcome: Patient did not attend group.  Clinical Observations/Feedback: Patient did not attend group.  Jacquelynn CreeGreene,Shadasia Oldfield M, LRT/CTRS 09/07/2016 11:34 AM

## 2016-09-07 NOTE — [facility] Group Notes (Signed)
[facility] LCSW Group Therapy Note  Date/Time: 09/07/16, 1300  Type of Therapy and Topic:  Group Therapy:  Overcoming Obstacles  Participation Level:  Pt did not attend group.  Description of Group:    In this group patients will be encouraged to explore what they see as obstacles to their own wellness and recovery. They will be guided to discuss their thoughts, feelings, and behaviors related to these obstacles. The group will process together ways to cope with barriers, with attention given to specific choices patients can make. Each patient will be challenged to identify changes they are motivated to make in order to overcome their obstacles. This group will be process-oriented, with patients participating in exploration of their own experiences as well as giving and receiving support and challenge from other group members.  Therapeutic Goals: 1. Patient will identify personal and current obstacles as they relate to admission. 2. Patient will identify barriers that currently interfere with their wellness or overcoming obstacles.  3. Patient will identify feelings, thought process and behaviors related to these barriers. 4. Patient will identify two changes they are willing to make to overcome these obstacles:    Summary of Patient Progress      Therapeutic Modalities:   Cognitive Behavioral Therapy Solution Focused Therapy Motivational Interviewing Relapse Prevention Therapy  Greg Velera [place], LCSW 

## 2016-09-07 NOTE — Progress Notes (Signed)
CSW spoke to Perley JainMary Graves from the group home, who will be ready for pt discharge tomorrow or Wed.  CSW spoke to [place]ice [facility] and they will resume services upon discharge.  CSW spoke with legal guardian Victory DakinVivian Harris to alert her that pt is near discharge. Garner NashGregory Peityn Payton, MSW, LCSW Clinical Social Worker 09/07/2016 3:07 PM

## 2016-09-07 NOTE — Progress Notes (Signed)
Patient ID: Rhodia AlbrightJames P Mander, male   DOB: 26-Nov-1975, 41 y.o.   MRN: [id number] Rough looking appearance, isolates to room except snack and medication time; "my mind and physical body is messed up..." A&OX3, denied pain, denied SI/HI, denied AV/H, medication compliant; no behavioral problems, will monitor.

## 2016-09-07 NOTE — Progress Notes (Signed)
Pt in his room except during snack time/wrap up group all evening. Minimally interacts with staff/peers. Somewhat irritable/short when approached. Appears flat/sad. Denies SI/HI. Endorses some auditory hallucinations. Medication compliant. Support and encouragement provided. Medications administered as ordered with education. Safety maintained with every 15 minute checks. Will continue to monitor.

## 2016-09-07 NOTE — Progress Notes (Signed)
Pt irritable this am. Isolative to self and room. Denies SI, HI, AVH. Reports just tired and wants to rest. Pt eats meals in dayroom and reports back to bed. Pt did not attend groups. Encouragement and support offered. Encouraged pt to attend groups and verbalize feelings. Pt refused to go to group. Pt remains safe on unit with q 15 min checks.

## 2016-09-07 NOTE — Plan of Care (Signed)
Problem: Role Relationship: Goal: Ability to interact with others will improve Outcome: Not Progressing Pt continues to be isolative to room and self
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
## 2016-09-07 NOTE — Plan of Care (Signed)
Problem: Role Relationship: Goal: Ability to interact with others will improve Outcome: Not Progressing Minimally interacts. Did come out of room in the evening for snack/wrap up group. Somewhat irritable on interaction.

## 2016-09-07 NOTE — Progress Notes (Signed)
MEDICATION RELATED CONSULT NOTE  Pharmacy Consult for Clozapine Monitoring Indication: Schizphrenia  Allergies  Allergen Reactions  . Penicillins     Patient Measurements: Height: 5\' 10"  (177.8 cm) Weight: 210 lb (95.3 kg) IBW/kg (Calculated) : 73  Vital Signs:   Intake/Output from previous day: 03/25 0701 - 03/26 0700 In: 1200 [P.O.:1200] Out: -  Intake/Output from this shift: No intake/output data recorded.  Labs:  Recent Labs  09/07/16 0641  WBC 5.8  HGB 15.6  HCT 44.6  PLT 218    Estimated Creatinine Clearance: 108.2 mL/min (by C-G formula based on SCr of 1.03 mg/dL).   Microbiology: No results found for this or any previous visit (from the past 720 hour(s)).  Medical History: Past Medical History:  Diagnosis Date  . Depression   . Diabetes mellitus without complication (HCC)   . Hyperlipidemia   . Hypertension   . PE (pulmonary thromboembolism) (HCC)   . Schizo affective schizophrenia (HCC)     Medications:  Scheduled:  . buPROPion  150 mg Oral Daily  . cloZAPine  450 mg Oral QHS  . docusate sodium  200 mg Oral BID  . ipratropium  1 spray Nasal QHS  . lithium carbonate  450 mg Oral Q12H  . LORazepam  2 mg Oral QHS  . metFORMIN  1,000 mg Oral BID WC  . mometasone-formoterol  2 puff Inhalation BID  . nicotine  21 mg Transdermal Daily  . polyethylene glycol  17 g Oral Daily  . simvastatin  40 mg Oral q1800  . Warfarin - Pharmacist Dosing Inpatient   Does not apply q1800    Assessment: 41 yo male with schizoaffective disorder bipolar type  3/26:  ANC= 3.5  Goal of Therapy:  Avoid adverse effects from Clozapine  Plan:  Clozapine Registry eligible for dispensing DOB Aug 22, 1975 [place] [address] Dr. Toni Amendlapacs  [id number]  Next ANC due:  09/14/2016  (Weekly while inpatient)  Bari MantisKristin Oliviya Gilkison PharmD Clinical Pharmacist 09/07/2016

## 2016-09-07 NOTE — Plan of Care (Signed)
Problem: Activity: Goal: Sleeping patterns will improve Outcome: Progressing Slept 8 hours, no sleep issues noted.

## 2016-09-07 NOTE — Tx Team (Signed)
Interdisciplinary Treatment and Diagnostic Plan Update  09/07/2016 Time of Session: 11:35am Lance AlbrightJames P [name] MRN: [id number]  Principal Diagnosis: Schizoaffective disorder, bipolar type [facility](HCC)  Secondary Diagnoses: Principal Problem:   Schizoaffective disorder, bipolar type (HCC) Active Problems:   Hypertension   Diabetes mellitus without complication (HCC)   Hyperlipidemia   History of pulmonary embolism   Chronic anticoagulation   Tobacco use disorder   Current Medications:  Current Facility-Administered Medications  Medication Dose Route Frequency Provider Last Rate Last Dose  . acetaminophen (TYLENOL) tablet 650 mg  650 mg Oral Q6H PRN Audery AmelJohn T Clapacs, MD      . alum & mag hydroxide-simeth (MAALOX/MYLANTA) 200-200-20 MG/5ML suspension 30 mL  30 mL Oral Q4H PRN Audery AmelJohn T Clapacs, MD      . buPROPion (WELLBUTRIN XL) 24 hr tablet 150 mg  150 mg Oral Daily Jimmy FootmanAndrea Hernandez-Gonzalez, MD   150 mg at 09/07/16 0746  . cloZAPine (CLOZARIL) tablet 450 mg  450 mg Oral QHS Jimmy FootmanAndrea Hernandez-Gonzalez, MD   450 mg at 09/06/16 2102  . docusate sodium (COLACE) capsule 200 mg  200 mg Oral BID Jimmy FootmanAndrea Hernandez-Gonzalez, MD   200 mg at 09/07/16 0747  . ipratropium (ATROVENT) 0.06 % nasal spray 1 spray  1 spray Nasal QHS Jimmy FootmanAndrea Hernandez-Gonzalez, MD   1 spray at 09/05/16 0231  . lithium carbonate (ESKALITH) CR tablet 450 mg  450 mg Oral Q12H Jimmy FootmanAndrea Hernandez-Gonzalez, MD   450 mg at 09/07/16 0746  . LORazepam (ATIVAN) tablet 2 mg  2 mg Oral QHS Jimmy FootmanAndrea Hernandez-Gonzalez, MD   2 mg at 09/06/16 2059  . magnesium hydroxide (MILK OF MAGNESIA) suspension 30 mL  30 mL Oral Daily PRN Audery AmelJohn T Clapacs, MD      . metFORMIN (GLUCOPHAGE-XR) 24 hr tablet 1,000 mg  1,000 mg Oral BID WC Jimmy FootmanAndrea Hernandez-Gonzalez, MD   1,000 mg at 09/07/16 0746  . mometasone-formoterol (DULERA) 200-5 MCG/ACT inhaler 2 puff  2 puff Inhalation BID Audery AmelJohn T Clapacs, MD   2 puff at 09/07/16 0746  . nicotine (NICODERM CQ - dosed in mg/24 hours)  patch 21 mg  21 mg Transdermal Daily Jimmy FootmanAndrea Hernandez-Gonzalez, MD   21 mg at 09/06/16 0806  . polyethylene glycol (MIRALAX / GLYCOLAX) packet 17 g  17 g Oral Daily Jimmy FootmanAndrea Hernandez-Gonzalez, MD   17 g at 09/07/16 0746  . simvastatin (ZOCOR) tablet 40 mg  40 mg Oral q1800 Jimmy FootmanAndrea Hernandez-Gonzalez, MD   40 mg at 09/06/16 2059  . Warfarin - Pharmacist Dosing Inpatient   Does not apply q1800 Jimmy FootmanAndrea Hernandez-Gonzalez, MD       PTA Medications: Prescriptions Prior to Admission  Medication Sig Dispense Refill Last Dose  . cloZAPine (CLOZARIL) 100 MG tablet Take 100-300 mg by mouth 2 (two) times daily. 100mg  in the morning and 300mg  at bedtime   unknown at unknown  . Fluticasone-Salmeterol (ADVAIR) 250-50 MCG/DOSE AEPB Inhale 1 puff into the lungs 2 (two) times daily.   unknown at unknown  . lithium carbonate 300 MG capsule Take 900 mg by mouth at bedtime.   unknown at unknown  . loperamide (IMODIUM A-D) 2 MG tablet Take 1 tablet (2 mg total) by mouth 4 (four) times daily as needed for diarrhea or loose stools. 12 tablet 0 prn at prn  . metFORMIN (GLUCOPHAGE) 1000 MG tablet Take 1,000 mg by mouth 2 (two) times daily with a meal.   unknown at unknown  . omega-3 acid ethyl esters (LOVAZA) 1 g capsule Take 1 g by mouth  daily.   unknown at unknown  . ondansetron (ZOFRAN ODT) 4 MG disintegrating tablet Take 1 tablet (4 mg total) by mouth every 8 (eight) hours as needed for nausea or vomiting. 20 tablet 0 prn at prn  . PARoxetine (PAXIL-CR) 25 MG 24 hr tablet Take 25 mg by mouth daily.   unknown at unknown  . simvastatin (ZOCOR) 40 MG tablet Take 40 mg by mouth at bedtime.   unknown at unknown  . traZODone (DESYREL) 100 MG tablet Take 150 mg by mouth at bedtime.    unknown at unknown  . warfarin (COUMADIN) 10 MG tablet Take 10 mg by mouth every other day.   unknown at unknown  . ziprasidone (GEODON) 80 MG capsule Take 80 mg by mouth 2 (two) times daily with a meal.   unknown at unknown    Patient  Stressors: Health problems Medication change or noncompliance  Patient Strengths: Motivation for treatment/growth Physical Health  Treatment Modalities: Medication Management, Group therapy, Case management,  1 to 1 session with clinician, Psychoeducation, Recreational therapy.   Physician Treatment Plan for Primary Diagnosis: Schizoaffective disorder, bipolar type (HCC) Long Term Goal(s): Improvement in symptoms so as ready for discharge Improvement in symptoms so as ready for discharge   Short Term Goals: Ability to identify and develop effective coping behaviors will improve Ability to identify triggers associated with substance abuse/mental health issues will improve Ability to identify changes in lifestyle to reduce recurrence of condition will improve Ability to verbalize feelings will improve  Medication Management: Evaluate patient's response, side effects, and tolerance of medication regimen.  Therapeutic Interventions: 1 to 1 sessions, Unit Group sessions and Medication administration.  Evaluation of Outcomes: Progressing  Physician Treatment Plan for Secondary Diagnosis: Principal Problem:   Schizoaffective disorder, bipolar type (HCC) Active Problems:   Hypertension   Diabetes mellitus without complication (HCC)   Hyperlipidemia   History of pulmonary embolism   Chronic anticoagulation   Tobacco use disorder  Long Term Goal(s): Improvement in symptoms so as ready for discharge Improvement in symptoms so as ready for discharge   Short Term Goals: Ability to identify and develop effective coping behaviors will improve Ability to identify triggers associated with substance abuse/mental health issues will improve Ability to identify changes in lifestyle to reduce recurrence of condition will improve Ability to verbalize feelings will improve     Medication Management: Evaluate patient's response, side effects, and tolerance of medication regimen.  Therapeutic  Interventions: 1 to 1 sessions, Unit Group sessions and Medication administration.  Evaluation of Outcomes: Progressing   RN Treatment Plan for Primary Diagnosis: Schizoaffective disorder, bipolar type (HCC) Long Term Goal(s): Knowledge of disease and therapeutic regimen to maintain health will improve  Short Term Goals: Ability to remain free from injury will improve, Ability to demonstrate self-control, Ability to disclose and discuss suicidal ideas, Ability to identify and develop effective coping behaviors will improve and Compliance with prescribed medications will improve  Medication Management: RN will administer medications as ordered by provider, will assess and evaluate patient's response and provide education to patient for prescribed medication. RN will report any adverse and/or side effects to prescribing provider.  Therapeutic Interventions: 1 on 1 counseling sessions, Psychoeducation, Medication administration, Evaluate responses to treatment, Monitor vital signs and CBGs as ordered, Perform/monitor CIWA, COWS, AIMS and Fall Risk screenings as ordered, Perform wound care treatments as ordered.  Evaluation of Outcomes: Progressing   LCSW Treatment Plan for Primary Diagnosis: Schizoaffective disorder, bipolar type (HCC) Long Term Goal(s): Safe  transition to appropriate next level of care at discharge, Engage patient in therapeutic group addressing interpersonal concerns.  Short Term Goals: Engage patient in aftercare planning with referrals and resources, Increase ability to appropriately verbalize feelings, Facilitate acceptance of mental health diagnosis and concerns and Increase skills for wellness and recovery  Therapeutic Interventions: Assess for all discharge needs, 1 to 1 time with Social worker, Explore available resources and support systems, Assess for adequacy in community support network, Educate family and significant other(s) on suicide prevention, Complete  Psychosocial Assessment, Interpersonal group therapy.  Evaluation of Outcomes: Progressing   Progress in Treatment: Attending groups: No Participating in groups: No Taking medication as prescribed: Yes. Toleration medication: Yes. Family/Significant other contact made: Yes, individual(s) contacted:  legal guardian Patient understands diagnosis: Yes. Discussing patient identified problems/goals with staff: Yes. Medical problems stabilized or resolved: Yes. Denies suicidal/homicidal ideation: Yes. Issues/concerns per patient self-inventory: No. Other: n/a  New problem(s) identified: None identified at this time.   New Short Term/Long Term Goal(s): None identified at this time.   Discharge Plan or Barriers: Patient will discharge back to group home and follow-up with [facility].   Reason for Continuation of Hospitalization: Hallucinations Medication stabilization  Estimated Length of Stay: 1-2 days.   Attendees: Patient:  09/07/2016   Physician: Dr. Daleen Bo, MD 09/07/2016   Nursing: Leonia Reader, RN 09/07/2016   RN Care Manager:  09/07/2016   Social Worker: Daleen Squibb, LCSW 09/07/2016   Recreational Therapist: Hershal Coria, LRT/CTRS  09/07/2016   Other:  09/07/2016   Other:  09/07/2016   Other: 09/07/2016      Scribe for Treatment Team: Lorri Frederick, LCSW 09/07/2016 2:11 PM

## 2016-09-07 NOTE — Consult Note (Signed)
ANTICOAGULATION CONSULT NOTE -  Pharmacy Consult for warfarin Indication: Hx of PE  Allergies  Allergen Reactions  . Penicillins     Patient Measurements: Height: 5\' 10"  (177.8 cm) Weight: 210 lb (95.3 kg) IBW/kg (Calculated) : 73  Vital Signs:    Labs:  Recent Labs  09/06/16 0858 09/07/16 0641  HGB  --  15.6  HCT  --  44.6  PLT  --  218  LABPROT 29.7* 35.3*  INR 2.76 3.42    Estimated Creatinine Clearance: 108.2 mL/min (by C-G formula based on SCr of 1.03 mg/dL).   Medical History: Past Medical History:  Diagnosis Date  . Depression   . Diabetes mellitus without complication (HCC)   . Hyperlipidemia   . Hypertension   . PE (pulmonary thromboembolism) (HCC)   . Schizo affective schizophrenia (HCC)     Medications:  Scheduled:  . buPROPion  150 mg Oral Daily  . cloZAPine  450 mg Oral QHS  . docusate sodium  200 mg Oral BID  . ipratropium  1 spray Nasal QHS  . lithium carbonate  450 mg Oral Q12H  . LORazepam  2 mg Oral QHS  . metFORMIN  1,000 mg Oral BID WC  . mometasone-formoterol  2 puff Inhalation BID  . nicotine  21 mg Transdermal Daily  . polyethylene glycol  17 g Oral Daily  . simvastatin  40 mg Oral q1800  . warfarin  8 mg Oral q1800  . Warfarin - Pharmacist Dosing Inpatient   Does not apply q1800    Assessment: Pt is a 41 year old male who presents from his group home with N/V/D and visual hallucinations. Pt has a PMH of schizoaffective disorder bipolar type and PE on warfarin. INR on admission is low at 1.27. Group home reports that they give the patient 12.5mg  alternating with 2.5mg . Called the cardiologist office who states that his INR in the beginning of Feb was 1.2. He was instructed to take 10mg  daily and on recheck on 28 Feb INR was 2.5. He was told to continue to take this dose. Of note, office states that the group home has a hx of not bringing pt to appointments. Prior to Oct 2017 visit, pt had not been seen since march of 2017 for INR  check. At pt last hospital stay pharmacy was dosing pt warfarin at 7.5mg  daily- INR was just therapeutic after about a week. Patient on Carbamazepine.  3/19  INR 1.27  Warfarin 8mg  3/20  INR 1.55  8 mg 3/21  INR 1.73  8 mg 3/22  INR 1.84  8 mg 3/23  INR 2.06  8 mg 3/24                  8 mg  3/25  INR 2.76  8 mg 3/26  INR 3.42  Dose Held   Goal of Therapy:  INR 2-3 Monitor platelets by anticoagulation protocol: Yes   Plan:  INR supratherapeutic. Will hold dose tonight and reassess INR in am.  Angelique BlonderMerrill,Gurley Climer A, [facility] B[facility] Gi Medical CorporationRPH Clinical Pharmacist 09/07/2016

## 2016-09-07 NOTE — [facility] Group Notes (Signed)
[facility] Group Notes:  (Nursing/MHT/Case Management/Adjunct)  Date:  09/07/2016  Time:  12:20 AM  Type of Therapy:  Psychoeducational Skills  Participation Level:  Active  Participation Quality:  Appropriate and Sharing  Affect:  Appropriate  Cognitive:  Oriented  Insight:  Appropriate  Engagement in Group:  Engaged  Modes of Intervention:  Discussion  Summary of Progress/Problems:  Sheree Lalla R Kathlen Sakurai 09/07/2016, 12:20 AM

## 2016-09-08 LAB — PROTIME-INR
INR: 2.63
PROTHROMBIN TIME: 28.6 s — AB (ref 11.4–15.2)

## 2016-09-08 LAB — CLOZAPINE (CLOZARIL)
CLOZAPINE LVL: 312 ng/mL — AB (ref 350–650)
NORCLOZAPINE: 302 ng/mL
TOTAL(CLOZ+ NORCLOZ): 614 ng/mL

## 2016-09-08 MED ORDER — WARFARIN SODIUM 6 MG PO TABS
7.0000 mg | ORAL_TABLET | Freq: Once | ORAL | Status: AC
  Administered 2016-09-08: 17:00:00 7 mg via ORAL
  Filled 2016-09-08: qty 1

## 2016-09-08 NOTE — Plan of Care (Signed)
Problem: Activity: Goal: Sleeping patterns will improve Outcome: Progressing Patient slept for Estimated Hours of 7.45; every 15 minutes safety round maintained, no injury or falls during this shift.    

## 2016-09-08 NOTE — Progress Notes (Signed)
D; Patient  Stayed closed to his room this shift . Voice concerns around his  Discharge  Stated he was ready to go home  denies  Auditory hallucinations  Attempts to go to group for short periods D: Patient stated slept good last night .Stated appetite is good and energy level  Is normal. Stated concentration is good . Stated on Depression scale 0 , hopeless 0 and anxiety0 .( low 0-10 high) Denies suicidal  homicidal ideations  .  No auditory hallucinations  No pain concerns . Appropriate ADL'S. Interacting with peers and staff.  A: Encourage patient participation with unit programming . Instruction  Given on  Medication , verbalize understanding. R: Voice no other concerns. Staff continue to monitor

## 2016-09-08 NOTE — Consult Note (Signed)
ANTICOAGULATION CONSULT NOTE -  Pharmacy Consult for warfarin Indication: Hx of PE  Allergies  Allergen Reactions  . Penicillins     Patient Measurements: Height: 5\' 10"  (177.8 cm) Weight: 210 lb (95.3 kg) IBW/kg (Calculated) : 73  Vital Signs: Temp: 97.8 F (36.6 C) (03/27 0721) Temp Source: Oral (03/27 0721) BP: 128/62 (03/27 0721) Pulse Rate: 81 (03/27 0721)  Labs:  Recent Labs  09/06/16 0858 09/07/16 0641 09/08/16 0718  HGB  --  15.6  --   HCT  --  44.6  --   PLT  --  218  --   LABPROT 29.7* 35.3* 28.6*  INR 2.76 3.42 2.63    Estimated Creatinine Clearance: 108.2 mL/min (by C-G formula based on SCr of 1.03 mg/dL).   Medical History: Past Medical History:  Diagnosis Date  . Depression   . Diabetes mellitus without complication (HCC)   . Hyperlipidemia   . Hypertension   . PE (pulmonary thromboembolism) (HCC)   . Schizo affective schizophrenia (HCC)     Medications:  Scheduled:  . buPROPion  150 mg Oral Daily  . cloZAPine  450 mg Oral QHS  . docusate sodium  200 mg Oral BID  . ipratropium  1 spray Nasal QHS  . lithium carbonate  450 mg Oral Q12H  . LORazepam  2 mg Oral QHS  . metFORMIN  1,000 mg Oral BID WC  . mometasone-formoterol  2 puff Inhalation BID  . nicotine  21 mg Transdermal Daily  . polyethylene glycol  17 g Oral Daily  . simvastatin  40 mg Oral q1800  . Warfarin - Pharmacist Dosing Inpatient   Does not apply q1800    Assessment: Pt is a 41 year old male who presents from his group home with N/V/D and visual hallucinations. Pt has a PMH of schizoaffective disorder bipolar type and PE on warfarin. INR on admission is low at 1.27. Group home reports that they give the patient 12.5mg  alternating with 2.5mg . Called the cardiologist office who states that his INR in the beginning of Feb was 1.2. He was instructed to take 10mg  daily and on recheck on 28 Feb INR was 2.5. He was told to continue to take this dose. Of note, office states that the  group home has a hx of not bringing pt to appointments. Prior to Oct 2017 visit, pt had not been seen since march of 2017 for INR check. At pt last hospital stay pharmacy was dosing pt warfarin at 7.5mg  daily- INR was just therapeutic after about a week. Patient on Carbamazepine.  3/19  INR 1.27  Warfarin 8mg  3/20  INR 1.55  8 mg 3/21  INR 1.73  8 mg 3/22  INR 1.84  8 mg 3/23  INR 2.06  8 mg 3/24                  8 mg  3/25  INR 2.76  8 mg 3/26  INR 3.42  Dose Held 3/27  INR 2.6     Goal of Therapy:  INR 2-3 Monitor platelets by anticoagulation protocol: Yes   Plan:  INR coming down.  Reduce dose to 7 mg and recheck in am.  Waldron LabsHayes,Yanelly Cantrelle K, [facility] For S[facility] LLCRPH Clinical Pharmacist 09/08/2016

## 2016-09-08 NOTE — Plan of Care (Signed)
Problem: Coping: Goal: Ability to verbalize frustrations and anger appropriately will improve Outcome: Not Progressing In bed  Most of shift . Limited participation  with group

## 2016-09-08 NOTE — [facility] Group Notes (Signed)
[facility] Group Notes:  (Nursing/MHT/Case Management/Adjunct)  Date:  09/08/2016  Time:  2:21 PM  Type of Therapy:  Psychoeducational Skills  Participation Level:  Minimal  Participation Quality:  Appropriate and Attentive  Affect:  Flat  Cognitive:  Appropriate  Insight:  Appropriate  Engagement in Group:  None  Modes of Intervention:  Discussion and Education  Summary of Progress/Problems:  [name] 09/08/2016, 2:21 PM

## 2016-09-08 NOTE — [facility] Group Notes (Signed)
[facility] Group Notes:  (Nursing/MHT/Case Management/Adjunct)  Date:  09/08/2016  Time:  2:03 AM  Type of Therapy:  Psychoeducational Skills  Participation Level:  Minimal  Participation Quality:  Appropriate  Affect:  Appropriate  Cognitive:  Appropriate  Insight:  Good  Engagement in Group:  Supportive  Modes of Intervention:  Activity  Summary of Progress/Problems:  Mayra NeerJackie L Damarion Mendizabal 09/08/2016, 2:03 AM
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
## 2016-09-09 LAB — PROTIME-INR
INR: 1.82
PROTHROMBIN TIME: 21.3 s — AB (ref 11.4–15.2)

## 2016-09-09 MED ORDER — WARFARIN SODIUM 6 MG PO TABS
7.0000 mg | ORAL_TABLET | Freq: Every day | ORAL | Status: DC
  Filled 2016-09-09: qty 1

## 2016-09-09 MED ORDER — WARFARIN SODIUM 7.5 MG PO TABS
7.5000 mg | ORAL_TABLET | Freq: Once | ORAL | Status: AC
  Administered 2016-09-09: 7.5 mg via ORAL
  Filled 2016-09-09: qty 1

## 2016-09-09 MED ORDER — LORAZEPAM 1 MG PO TABS
1.0000 mg | ORAL_TABLET | Freq: Every day | ORAL | Status: DC
  Administered 2016-09-09: 1 mg via ORAL
  Filled 2016-09-09: qty 1

## 2016-09-09 NOTE — Progress Notes (Signed)
Patient ID: [name], male   DOB: 1976/03/21, 41 y.o.   MRN: [id number]  Poorly kept condition and appearance, disheveled, distended abdomen, A&Ox3, denied pain, denied SI/HI, denied AV/H, medication compliant, no behavioral problems.
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
## 2016-09-09 NOTE — Plan of Care (Signed)
Problem: Coping: Goal: Ability to verbalize frustrations and anger appropriately will improve Outcome: Not Progressing Laying  In bed , limited  Interaction  With peers

## 2016-09-09 NOTE — [facility] Group Notes (Signed)
[facility] Group Notes:  (Nursing/MHT/Case Management/Adjunct)  Date:  09/09/2016  Time:  12:46 AM  Type of Therapy:  Evening Wrap-up Group  Participation Level:  Minimal  Participation Quality:  Appropriate and Attentive  Affect:  Appropriate  Cognitive:  Alert and Appropriate  Insight:  Appropriate  Engagement in Group:  Developing/Improving  Modes of Intervention:  Discussion  Summary of Progress/Problems:  Lance MorrowChelsea Nanta Takira [name] 09/09/2016, 12:46 AM

## 2016-09-09 NOTE — Consult Note (Signed)
ANTICOAGULATION CONSULT NOTE -  Pharmacy Consult for warfarin Indication: Hx of PE  Allergies  Allergen Reactions  . Penicillins    Patient Measurements: Height: 5\' 10"  (177.8 cm) Weight: 210 lb (95.3 kg) IBW/kg (Calculated) : 73  Labs:  Recent Labs  09/07/16 0641 09/08/16 0718 09/09/16 1453  HGB 15.6  --   --   HCT 44.6  --   --   PLT 218  --   --   LABPROT 35.3* 28.6* 21.3*  INR 3.42 2.63 1.82    Estimated Creatinine Clearance: 108.2 mL/min (by C-G formula based on SCr of 1.03 mg/dL).   Medical History: Past Medical History:  Diagnosis Date  . Depression   . Diabetes mellitus without complication (HCC)   . Hyperlipidemia   . Hypertension   . PE (pulmonary thromboembolism) (HCC)   . Schizo affective schizophrenia (HCC)     Medications:  Scheduled:  . buPROPion  150 mg Oral Daily  . cloZAPine  450 mg Oral QHS  . docusate sodium  200 mg Oral BID  . ipratropium  1 spray Nasal QHS  . lithium carbonate  450 mg Oral Q12H  . LORazepam  1 mg Oral QHS  . metFORMIN  1,000 mg Oral BID WC  . mometasone-formoterol  2 puff Inhalation BID  . nicotine  21 mg Transdermal Daily  . polyethylene glycol  17 g Oral Daily  . simvastatin  40 mg Oral q1800  . warfarin  7.5 mg Oral ONCE-1800  . Warfarin - Pharmacist Dosing Inpatient   Does not apply q1800    Assessment: Pt is a 41 year old male who presents from his group home with N/V/D and visual hallucinations. Pt has a PMH of schizoaffective disorder bipolar type and PE on warfarin. INR on admission is low at 1.27. Group home reports that they give the patient 12.5mg  alternating with 2.5mg . Called the cardiologist office who states that his INR in the beginning of Feb was 1.2. He was instructed to take 10mg  daily and on recheck on 28 Feb INR was 2.5. He was told to continue to take this dose. Of note, office states that the group home has a hx of not bringing pt to appointments. Prior to Oct 2017 visit, pt had not been seen  since march of 2017 for INR check. At pt last hospital stay pharmacy was dosing pt warfarin at 7.5mg  daily- INR was just therapeutic after about a week.   3/19  INR 1.27  Warfarin 8mg  3/20  INR 1.55  8 mg 3/21  INR 1.73  8 mg 3/22  INR 1.84  8 mg 3/23  INR 2.06  8 mg 3/24                  8 mg  3/25  INR 2.76  8 mg 3/26  INR 3.42  Dose Held 3/27  INR 2.6  7 mg 3/28 INR 1.82  7.5 mg   Goal of Therapy:  INR 2-3 Monitor platelets by anticoagulation protocol: Yes   Plan:  INR subtherapeutic after being held. Will give warfarin 7.5 mg PO this evening. CBC and INR ordered with AM labs tomorrow.  Cindi CarbonMary M Aryan Bello, [facility] CenterRPH Clinical Pharmacist 09/09/2016

## 2016-09-09 NOTE — [facility] Group Notes (Signed)
[facility] Group Notes:  (Nursing/MHT/Case Management/Adjunct)  Date:  09/09/2016  Time:  5:42 PM  Type of Therapy:  Psychoeducational Skills  Participation Level:  Active  Participation Quality:  Appropriate  Affect:  Appropriate  Cognitive:  Appropriate  Insight:  Improving  Engagement in Group:  Limited  Modes of Intervention:  Socialization  Summary of Progress/Problems:  Mickey Farberamela M Moses Ellison 09/09/2016, 5:42 PM

## 2016-09-09 NOTE — Plan of Care (Signed)
Problem: Activity: Goal: Sleeping patterns will improve Outcome: Progressing Patient slept for Estimated Hours of 7; every 15 minutes safety round maintained, no injury or falls during this shift.    

## 2016-09-09 NOTE — Progress Notes (Cosign Needed)
CSW and [employer] staff member Thayer Headings met with pt at Dr Einar Grad request.  After a conversation, Thayer Headings reports that pt does appear more sluggish than she has seen him.  She also reports that the Armen Pickup MD will most likely not continue giving pt ativan for sleep due to concerns of addiction.  She also said pt is typically not incontinent.  Overall impression is that pt is not at baseline at this time. Winferd Humphrey, MSW, LCSW Clinical Social Worker 09/09/2016 2:36 PM

## 2016-09-09 NOTE — [facility] Group Notes (Signed)
  [facility] LCSW Group Therapy Note  Date/Time: 09/09/16, 1300  Type of Therapy/Topic:  Group Therapy:  Emotion Regulation  Participation Level:  Did Not Attend   Mood:  Description of Group:    The purpose of this group is to assist patients in learning to regulate negative emotions and experience positive emotions. Patients will be guided to discuss ways in which they have been vulnerable to their negative emotions. These vulnerabilities will be juxtaposed with experiences of positive emotions or situations, and patients challenged to use positive emotions to combat negative ones. Special emphasis will be placed on coping with negative emotions in conflict situations, and patients will process healthy conflict resolution skills.  Therapeutic Goals: 1. Patient will identify two positive emotions or experiences to reflect on in order to balance out negative emotions:  2. Patient will label two or more emotions that they find the most difficult to experience:  3. Patient will be able to demonstrate positive conflict resolution skills through discussion or role plays:   Summary of Patient Progress:       Therapeutic Modalities:   Cognitive Behavioral Therapy Feelings Identification Dialectical Behavioral Therapy  Greg Ericia Moxley, LCSW 

## 2016-09-10 LAB — CBC
HCT: 41.8 % (ref 40.0–52.0)
Hemoglobin: 14.9 g/dL (ref 13.0–18.0)
MCH: 31.9 pg (ref 26.0–34.0)
MCHC: 35.6 g/dL (ref 32.0–36.0)
MCV: 89.6 fL (ref 80.0–100.0)
PLATELETS: 201 10*3/uL (ref 150–440)
RBC: 4.66 MIL/uL (ref 4.40–5.90)
RDW: 13 % (ref 11.5–14.5)
WBC: 5 10*3/uL (ref 3.8–10.6)

## 2016-09-10 LAB — PROTIME-INR
INR: 2
PROTHROMBIN TIME: 23 s — AB (ref 11.4–15.2)

## 2016-09-10 MED ORDER — WARFARIN SODIUM 7.5 MG PO TABS
7.5000 mg | ORAL_TABLET | Freq: Once | ORAL | Status: AC
  Administered 2016-09-10: 7.5 mg via ORAL
  Filled 2016-09-10: qty 1

## 2016-09-10 MED ORDER — LORAZEPAM 0.5 MG PO TABS
0.5000 mg | ORAL_TABLET | Freq: Every day | ORAL | Status: DC
  Administered 2016-09-10 – 2016-09-11 (×2): 0.5 mg via ORAL
  Filled 2016-09-10 (×2): qty 1
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
## 2016-09-10 NOTE — [facility] Group Notes (Signed)
[facility] LCSW Group Therapy Note  Date/Time: 09/10/16, 1300  Type of Therapy/Topic:  Group Therapy:  Balance in Life  Participation Level:  active  Description of Group:    This group will address the concept of balance and how it feels and looks when one is unbalanced. Patients will be encouraged to process areas in their lives that are out of balance, and identify reasons for remaining unbalanced. Facilitators will guide patients utilizing problem- solving interventions to address and correct the stressor making their life unbalanced. Understanding and applying boundaries will be explored and addressed for obtaining  and maintaining a balanced life. Patients will be encouraged to explore ways to assertively make their unbalanced needs known to significant others in their lives, using other group members and facilitator for support and feedback.  Therapeutic Goals: 1. Patient will identify two or more emotions or situations they have that consume much of in their lives. 2. Patient will identify signs/triggers that life has become out of balance:  3. Patient will identify two ways to set boundaries in order to achieve balance in their lives:  4. Patient will demonstrate ability to communicate their needs through discussion and/or role plays  Summary of Patient Progress: Pt identified physical aspects of his life like eating and sleep as easily getting out of balance.  He also identified that he has very little contact with any family members.  Pt was active in group today and made a number of contributions to the discussion.          Therapeutic Modalities:   Cognitive Behavioral Therapy Solution-Focused Therapy Assertiveness Training  Daleen SquibbGreg Tobechukwu Emmick, Kentucky[place]CS[place]

## 2016-09-10 NOTE — Plan of Care (Signed)
Problem: Activity: Goal: Sleeping patterns will improve Outcome: Not Met (add Reason) Slept 7.30 hours. No behavior issues noted. Safety maintained with q 15 min checks

## 2016-09-10 NOTE — Progress Notes (Signed)
Patient is quiet on unit, compliant with medications. Spent most of morning in bed but has been walking the unit this afternoon. Denies SI.HI.AVH.  No complaints. Will continue to monitor and provide interventions as needed.

## 2016-09-10 NOTE — Consult Note (Signed)
ANTICOAGULATION CONSULT NOTE -  Pharmacy Consult for warfarin Indication: Hx of PE  Allergies  Allergen Reactions  . Penicillins    Patient Measurements: Height: 5\' 10"  (177.8 cm) Weight: 210 lb (95.3 kg) IBW/kg (Calculated) : 73  Labs:  Recent Labs  09/08/16 0718 09/09/16 1453 09/10/16 0734  HGB  --   --  14.9  HCT  --   --  41.8  PLT  --   --  201  LABPROT 28.6* 21.3* 23.0*  INR 2.63 1.82 2.00    Estimated Creatinine Clearance: 108.2 mL/min (by C-G formula based on SCr of 1.03 mg/dL).   Medical History: Past Medical History:  Diagnosis Date  . Depression   . Diabetes mellitus without complication (HCC)   . Hyperlipidemia   . Hypertension   . PE (pulmonary thromboembolism) (HCC)   . Schizo affective schizophrenia (HCC)     Medications:  Scheduled:  . buPROPion  150 mg Oral Daily  . cloZAPine  450 mg Oral QHS  . docusate sodium  200 mg Oral BID  . ipratropium  1 spray Nasal QHS  . lithium carbonate  450 mg Oral Q12H  . LORazepam  0.5 mg Oral QHS  . metFORMIN  1,000 mg Oral BID WC  . mometasone-formoterol  2 puff Inhalation BID  . nicotine  21 mg Transdermal Daily  . polyethylene glycol  17 g Oral Daily  . simvastatin  40 mg Oral q1800  . Warfarin - Pharmacist Dosing Inpatient   Does not apply q1800    Assessment: Pt is a 41 year old male who presents from his group home with N/V/D and visual hallucinations. Pt has a PMH of schizoaffective disorder bipolar type and PE on warfarin. INR on admission is low at 1.27. Group home reports that they give the patient 12.5mg  alternating with 2.5mg . Called the cardiologist office who states that his INR in the beginning of Feb was 1.2. He was instructed to take 10mg  daily and on recheck on 28 Feb INR was 2.5. He was told to continue to take this dose. Of note, office states that the group home has a hx of not bringing pt to appointments. Prior to Oct 2017 visit, pt had not been seen since march of 2017 for INR check. At  pt last hospital stay pharmacy was dosing pt warfarin at 7.5mg  daily- INR was just therapeutic after about a week.   3/19  INR 1.27  Warfarin 8mg  3/20  INR 1.55  8 mg 3/21  INR 1.73  8 mg 3/22  INR 1.84  8 mg 3/23  INR 2.06  8 mg 3/24                  8 mg  3/25  INR 2.76  8 mg 3/26  INR 3.42  Dose Held 3/27  INR 2.6  7 mg 3/28  INR 1.82  7.5 mg 3/29  INR 2.0   Goal of Therapy:  INR 2-3 Monitor platelets by anticoagulation protocol: Yes   Plan:  INR is therapeutic. Will give warfarin 7.5 mg PO this evening. CBC and INR ordered with AM labs tomorrow.  Stormy CardKatsoudas,Anelly Samarin K, Bluegrass Surgery And Laser CenterRPH Clinical Pharmacist 09/10/2016

## 2016-09-11 LAB — PROTIME-INR
INR: 2.37
Prothrombin Time: 26.3 seconds — ABNORMAL HIGH (ref 11.4–15.2)

## 2016-09-11 MED ORDER — WARFARIN SODIUM 7.5 MG PO TABS
7.5000 mg | ORAL_TABLET | Freq: Once | ORAL | Status: AC
  Administered 2016-09-11: 7.5 mg via ORAL
  Filled 2016-09-11: qty 1

## 2016-09-11 NOTE — Tx Team (Signed)
Interdisciplinary Treatment and Diagnostic Plan Update  09/11/2016 Time of Session: 11:00am Lance [name] MRN: [id number]  Principal Diagnosis: Schizoaffective disorder, bipolar type [facility])  Secondary Diagnoses: Principal Problem:   Schizoaffective disorder, bipolar type (HCC) Active Problems:   Hypertension   Diabetes mellitus without complication (HCC)   Hyperlipidemia   History of pulmonary embolism   Chronic anticoagulation   Tobacco use disorder   Current Medications:  Current Facility-Administered Medications  Medication Dose Route Frequency Provider Last Rate Last Dose  . acetaminophen (TYLENOL) tablet 650 mg  650 mg Oral Q6H PRN Audery Amel, MD      . alum & mag hydroxide-simeth (MAALOX/MYLANTA) 200-200-20 MG/5ML suspension 30 mL  30 mL Oral Q4H PRN Audery Amel, MD      . buPROPion (WELLBUTRIN XL) 24 hr tablet 150 mg  150 mg Oral Daily Jimmy Footman, MD   150 mg at 09/11/16 0853  . cloZAPine (CLOZARIL) tablet 450 mg  450 mg Oral QHS Jimmy Footman, MD   450 mg at 09/10/16 2116  . docusate sodium (COLACE) capsule 200 mg  200 mg Oral BID Jimmy Footman, MD   200 mg at 09/11/16 4098  . ipratropium (ATROVENT) 0.06 % nasal spray 1 spray  1 spray Nasal QHS Jimmy Footman, MD   1 spray at 09/10/16 2121  . lithium carbonate (ESKALITH) CR tablet 450 mg  450 mg Oral Q12H Jimmy Footman, MD   450 mg at 09/11/16 0852  . LORazepam (ATIVAN) tablet 0.5 mg  0.5 mg Oral QHS Himabindu Ravi, MD   0.5 mg at 09/10/16 2120  . magnesium hydroxide (MILK OF MAGNESIA) suspension 30 mL  30 mL Oral Daily PRN Audery Amel, MD      . metFORMIN (GLUCOPHAGE-XR) 24 hr tablet 1,000 mg  1,000 mg Oral BID WC Jimmy Footman, MD   1,000 mg at 09/11/16 0853  . mometasone-formoterol (DULERA) 200-5 MCG/ACT inhaler 2 puff  2 puff Inhalation BID Audery Amel, MD   2 puff at 09/11/16 [phone]  . nicotine (NICODERM CQ - dosed in mg/24 hours) patch 21  mg  21 mg Transdermal Daily Jimmy Footman, MD   21 mg at 09/10/16 0942  . polyethylene glycol (MIRALAX / GLYCOLAX) packet 17 g  17 g Oral Daily Jimmy Footman, MD   17 g at 09/11/16 0854  . simvastatin (ZOCOR) tablet 40 mg  40 mg Oral q1800 Jimmy Footman, MD   40 mg at 09/10/16 2119  . Warfarin - Pharmacist Dosing Inpatient   Does not apply q1800 Jimmy Footman, MD       PTA Medications: Prescriptions Prior to Admission  Medication Sig Dispense Refill Last Dose  . cloZAPine (CLOZARIL) 100 MG tablet Take 100-300 mg by mouth 2 (two) times daily.  in the morning and  at bedtime   unknown at unknown  . Fluticasone-Salmeterol (ADVAIR) 250-50 MCG/DOSE AEPB Inhale 1 puff into the lungs 2 (two) times daily.   unknown at unknown  . lithium carbonate 300 MG capsule Take 900 mg by mouth at bedtime.   unknown at unknown  . loperamide (IMODIUM A-D) 2 MG tablet Take 1 tablet (2 mg total) by mouth 4 (four) times daily as needed for diarrhea or loose stools. 12 tablet 0 prn at prn  . metFORMIN (GLUCOPHAGE) 1000 MG tablet Take 1,000 mg by mouth 2 (two) times daily with a meal.   unknown at unknown  . omega-3 acid ethyl esters (LOVAZA) 1 g capsule Take 1 g by mouth  daily.   unknown at unknown  . ondansetron (ZOFRAN ODT) 4 MG disintegrating tablet Take 1 tablet (4 mg total) by mouth every 8 (eight) hours as needed for nausea or vomiting. 20 tablet 0 prn at prn  . PARoxetine (PAXIL-CR) 25 MG 24 hr tablet Take 25 mg by mouth daily.   unknown at unknown  . simvastatin (ZOCOR) 40 MG tablet Take 40 mg by mouth at bedtime.   unknown at unknown  . traZODone (DESYREL) 100 MG tablet Take 150 mg by mouth at bedtime.    unknown at unknown  . warfarin (COUMADIN) 10 MG tablet Take 10 mg by mouth every other day.   unknown at unknown  . ziprasidone (GEODON) 80 MG capsule Take 80 mg by mouth 2 (two) times daily with a meal.   unknown at unknown    Patient Stressors: Health  problems Medication change or noncompliance  Patient Strengths: Motivation for treatment/growth Physical Health  Treatment Modalities: Medication Management, Group therapy, Case management,  1 to 1 session with clinician, Psychoeducation, Recreational therapy.   Physician Treatment Plan for Primary Diagnosis: Schizoaffective disorder, bipolar type (HCC) Long Term Goal(s): Improvement in symptoms so as ready for discharge Improvement in symptoms so as ready for discharge   Short Term Goals: Ability to identify and develop effective coping behaviors will improve Ability to identify triggers associated with substance abuse/mental health issues will improve Ability to identify changes in lifestyle to reduce recurrence of condition will improve Ability to verbalize feelings will improve  Medication Management: Evaluate patient's response, side effects, and tolerance of medication regimen.  Therapeutic Interventions: 1 to 1 sessions, Unit Group sessions and Medication administration.  Evaluation of Outcomes: Progressing  Physician Treatment Plan for Secondary Diagnosis: Principal Problem:   Schizoaffective disorder, bipolar type (HCC) Active Problems:   Hypertension   Diabetes mellitus without complication (HCC)   Hyperlipidemia   History of pulmonary embolism   Chronic anticoagulation   Tobacco use disorder  Long Term Goal(s): Improvement in symptoms so as ready for discharge Improvement in symptoms so as ready for discharge   Short Term Goals: Ability to identify and develop effective coping behaviors will improve Ability to identify triggers associated with substance abuse/mental health issues will improve Ability to identify changes in lifestyle to reduce recurrence of condition will improve Ability to verbalize feelings will improve     Medication Management: Evaluate patient's response, side effects, and tolerance of medication regimen.  Therapeutic Interventions: 1 to 1  sessions, Unit Group sessions and Medication administration.  Evaluation of Outcomes: Progressing   RN Treatment Plan for Primary Diagnosis: Schizoaffective disorder, bipolar type (HCC) Long Term Goal(s): Knowledge of disease and therapeutic regimen to maintain health will improve  Short Term Goals: Ability to remain free from injury will improve, Ability to demonstrate self-control, Ability to disclose and discuss suicidal ideas, Ability to identify and develop effective coping behaviors will improve and Compliance with prescribed medications will improve  Medication Management: RN will administer medications as ordered by provider, will assess and evaluate patient's response and provide education to patient for prescribed medication. RN will report any adverse and/or side effects to prescribing provider.  Therapeutic Interventions: 1 on 1 counseling sessions, Psychoeducation, Medication administration, Evaluate responses to treatment, Monitor vital signs and CBGs as ordered, Perform/monitor CIWA, COWS, AIMS and Fall Risk screenings as ordered, Perform wound care treatments as ordered.  Evaluation of Outcomes: Progressing   LCSW Treatment Plan for Primary Diagnosis: Schizoaffective disorder, bipolar type (HCC) Long Term Goal(s): Safe  transition to appropriate next level of care at discharge, Engage patient in therapeutic group addressing interpersonal concerns.  Short Term Goals: Engage patient in aftercare planning with referrals and resources, Increase ability to appropriately verbalize feelings, Facilitate acceptance of mental health diagnosis and concerns and Increase skills for wellness and recovery  Therapeutic Interventions: Assess for all discharge needs, 1 to 1 time with Social worker, Explore available resources and support systems, Assess for adequacy in community support network, Educate family and significant other(s) on suicide prevention, Complete Psychosocial Assessment,  Interpersonal group therapy.  Evaluation of Outcomes: Progressing   Progress in Treatment: Attending groups: Yes. Participating in groups: Yes. Taking medication as prescribed: Yes. Toleration medication: Yes. Family/Significant other contact made: Yes, individual(s) contacted:  legal guardian Patient understands diagnosis: Yes. Discussing patient identified problems/goals with staff: Yes. Medical problems stabilized or resolved: Yes. Denies suicidal/homicidal ideation: Yes. Issues/concerns per patient self-inventory: No. Other: n/a  New problem(s) identified: None identified at this time.   New Short Term/Long Term Goal(s): None identified at this time.   Discharge Plan or Barriers: Patient will discharge back to group home and follow-up with [facility].   Reason for Continuation of Hospitalization: Depression Medication stabilization  Estimated Length of Stay: 3 to 5 days.   Attendees: Patient: 09/11/2016 1:48 PM  Physician: Dr. Patrick North, MD 09/11/2016 1:48 PM  Nursing: Leonia Reader, RN 09/11/2016 1:48 PM  RN Care Manager: 09/11/2016 1:48 PM  Social Worker: Fredrich Birks. Garnette Czech MSW, LCSWA 09/11/2016 1:48 PM  Recreational Therapist:  09/11/2016 1:48 PM  Other:  09/11/2016 1:48 PM  Other:  09/11/2016 1:48 PM  Other: 09/11/2016 1:48 PM    Scribe for Treatment Team: Arelia Longest, LCSWA 09/11/2016 1:51 PM
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
## 2016-09-11 NOTE — Consult Note (Signed)
ANTICOAGULATION CONSULT NOTE -  Pharmacy Consult for warfarin Indication: Hx of PE  Allergies  Allergen Reactions  . Penicillins    Patient Measurements: Height:  (177.8 cm) Weight: 210 lb (95.3 kg) IBW/kg (Calculated) : 73  Labs:  Recent Labs  09/09/16 1453 09/10/16 0734 09/11/16 0703  HGB  --  14.9  --   HCT  --  41.8  --   PLT  --  201  --   LABPROT 21.3* 23.0* 26.3*  INR 1.82 2.00 2.37    Estimated Creatinine Clearance: 108.2 mL/min (by C-G formula based on SCr of 1.03 mg/dL).   Medical History: Past Medical History:  Diagnosis Date  . Depression   . Diabetes mellitus without complication (HCC)   . Hyperlipidemia   . Hypertension   . PE (pulmonary thromboembolism) (HCC)   . Schizo affective schizophrenia (HCC)     Medications:  Scheduled:  . buPROPion  150 mg Oral Daily  . cloZAPine  450 mg Oral QHS  . docusate sodium  200 mg Oral BID  . ipratropium  1 spray Nasal QHS  . lithium carbonate  450 mg Oral Q12H  . LORazepam  0.5 mg Oral QHS  . metFORMIN  1,000 mg Oral BID WC  . mometasone-formoterol  2 puff Inhalation BID  . nicotine  21 mg Transdermal Daily  . polyethylene glycol  17 g Oral Daily  . simvastatin  40 mg Oral q1800  . Warfarin - Pharmacist Dosing Inpatient   Does not apply q1800    Assessment: Pt is a 41 year old male who presents from his group home with N/V/D and visual hallucinations. Pt has a PMH of schizoaffective disorder bipolar type and PE on warfarin. INR on admission is low at 1.27. Group home reports that they give the patient 12.5mg  alternating with 2.5mg . Called the cardiologist office who states that his INR in the beginning of Feb was 1.2. He was instructed to take  daily and on recheck on 28 Feb INR was 2.5. He was told to continue to take this dose. Of note, office states that the group home has a hx of not bringing pt to appointments. Prior to Oct 2017 visit, pt had not been seen since march of 2017 for INR check. At  pt last hospital stay pharmacy was dosing pt warfarin at 7.5mg  daily- INR was just therapeutic after about a week.   3/19  INR 1.27  Warfarin  3/20  INR 1.55  8 mg 3/21  INR 1.73  8 mg 3/22  INR 1.84  8 mg 3/23  INR 2.06  8 mg 3/24                  8 mg  3/25  INR 2.76  8 mg 3/26  INR 3.42  Dose Held 3/27  INR 2.6  7 mg 3/28  INR 1.82  7.5 mg 3/29  INR 2.0     7.5 mg 3/30  INR 2.37   Goal of Therapy:  INR 2-3 Monitor platelets by anticoagulation protocol: Yes   Plan:  INR is therapeutic. Will give warfarin 7.5 mg PO this evening. CBC and INR ordered with AM labs tomorrow.  [facility], [facility] 09/11/2016

## 2016-09-11 NOTE — Plan of Care (Signed)
Problem: Activity: Goal: Interest or engagement in activities will improve Outcome: Not Progressing Remains isolative in room, offers little to staff and peers.

## 2016-09-11 NOTE — Progress Notes (Signed)
Received AAOx4 today, disheveled, continued complaints of feeling tired. Compliant with meals and medications. Does not interact much with peers or staff. Asks about discharge and is encouraged to participate in unit milieu. Denies SI.HI.AVH. Will monitor.

## 2016-09-11 NOTE — [facility] Group Notes (Signed)
[facility] Group Notes:  (Nursing/MHT/Case Management/Adjunct)  Date:  09/11/2016  Time:  12:14 AM  Type of Therapy:  Psychoeducational Skills  Participation Level:  Active  Participation Quality:  Appropriate and Sharing  Affect:  Appropriate  Cognitive:  Appropriate  Insight:  Appropriate and Good  Engagement in Group:  Engaged  Modes of Intervention:  Discussion, Socialization and Support  Summary of Progress/Problems:  Chancy Milroy 09/11/2016, 12:14 AM

## 2016-09-12 MED ORDER — WARFARIN SODIUM 5 MG PO TABS
5.0000 mg | ORAL_TABLET | Freq: Once | ORAL | Status: AC
  Administered 2016-09-12: 5 mg via ORAL
  Filled 2016-09-12: qty 1

## 2016-09-12 MED ORDER — TRAZODONE HCL 100 MG PO TABS
100.0000 mg | ORAL_TABLET | Freq: Every evening | ORAL | Status: DC | PRN
  Administered 2016-09-12 – 2016-09-13 (×2): 100 mg via ORAL
  Filled 2016-09-12 (×2): qty 1

## 2016-09-12 NOTE — Progress Notes (Signed)
Pt affect blunted. Forwards little. Denies any SI/HI/AVH. Wanted to make sure he was getting his evening Ativan. Medication compliant. Voices no additional concerns at this time. Safety maintained. Will continue to monitor.

## 2016-09-12 NOTE — [facility] Group Notes (Signed)
[facility] Group Notes:  (Nursing/MHT/Case Management/Adjunct)  Date:  09/12/2016  Time:  9:34 PM  Type of Therapy:  Evening Wrap-up Group  Participation Level:  Active  Participation Quality:  Active  Affect:  N/A  Cognitive:  N/A  Insight:  None  Engagement in Group:  Active  Modes of Intervention:  Discussion  Summary of Progress/Problems:  Tomasita Morrow 09/12/2016, 9:34 PM
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
## 2016-09-12 NOTE — [facility] Group Notes (Signed)
[facility] LCSW Group Therapy  09/12/2016 2:23 PM  Type of Therapy:  Group Therapy  Participation Level:  Active  Participation Quality:  Appropriate  Affect:  Appropriate  Cognitive:  Alert  Insight:  Developing/Improving  Engagement in Therapy:  Engaged  Modes of Intervention:  Discussion, Education, Problem-solving, Dance movement psychotherapist, Socialization and Support  Summary of Progress/Problems: Coping Skills: Patients defined and discussed healthy coping skills. Patients identified healthy coping skills they would like to try during hospitalization and after discharge. CSW offered insight to varying coping skills that may have been new to patients such as practicing mindfulness. Patient discussed utilizing coping skills such as praying, deep breathing, and taking a walk.   Meghanne Pletz G. Garnette Czech MSW, LCSWA 09/12/2016, 2:25 PM

## 2016-09-12 NOTE — Plan of Care (Signed)
Problem: Safety: Goal: Ability to remain free from injury will improve Outcome: Progressing No injury reported or observed   

## 2016-09-12 NOTE — Progress Notes (Signed)
Pt isolative to self and room. Denies SI, HI, AVH. No negative behaviors. Pacing halls part of today. Minimal interaction.  Encouragement and support offered. Safety checks maintained. Pt receptive and remains safe on unit with q 15 min checks.

## 2016-09-12 NOTE — Consult Note (Signed)
ANTICOAGULATION CONSULT NOTE -  Pharmacy Consult for warfarin Indication: Hx of PE  Allergies  Allergen Reactions  . Penicillins    Patient Measurements: Height:  (177.8 cm) Weight: 210 lb (95.3 kg) IBW/kg (Calculated) : 73  Labs:  Recent Labs  09/09/16 1453 09/10/16 0734 09/11/16 0703  HGB  --  14.9  --   HCT  --  41.8  --   PLT  --  201  --   LABPROT 21.3* 23.0* 26.3*  INR 1.82 2.00 2.37    Estimated Creatinine Clearance: 108.2 mL/min (by C-G formula based on SCr of 1.03 mg/dL).   Medical History: Past Medical History:  Diagnosis Date  . Depression   . Diabetes mellitus without complication (HCC)   . Hyperlipidemia   . Hypertension   . PE (pulmonary thromboembolism) (HCC)   . Schizo affective schizophrenia (HCC)     Medications:  Scheduled:  . buPROPion  150 mg Oral Daily  . cloZAPine  450 mg Oral QHS  . docusate sodium  200 mg Oral BID  . ipratropium  1 spray Nasal QHS  . lithium carbonate  450 mg Oral Q12H  . metFORMIN  1,000 mg Oral BID WC  . mometasone-formoterol  2 puff Inhalation BID  . nicotine  21 mg Transdermal Daily  . polyethylene glycol  17 g Oral Daily  . simvastatin  40 mg Oral q1800  . warfarin  5 mg Oral ONCE-1800  . Warfarin - Pharmacist Dosing Inpatient   Does not apply q1800    Assessment: Pt is a 41 year old male who presents from his group home with N/V/D and visual hallucinations. Pt has a PMH of schizoaffective disorder bipolar type and PE on warfarin. INR on admission is low at 1.27. Group home reports that they give the patient 12.5mg  alternating with 2.5mg . Called the cardiologist office who states that his INR in the beginning of Feb was 1.2. He was instructed to take  daily and on recheck on 28 Feb INR was 2.5. He was told to continue to take this dose. Of note, office states that the group home has a hx of not bringing pt to appointments. Prior to Oct 2017 visit, pt had not been seen since march of 2017 for INR check.  At pt last hospital stay pharmacy was dosing pt warfarin at 7.5mg  daily- INR was just therapeutic after about a week.   3/19   INR 1.27    3/20  INR 1.55   8 mg 3/21   INR 1.73   8 mg 3/22   INR 1.84   8 mg 3/23   INR 2.06   8 mg 3/24                   8 mg  3/25   INR 2.76   8 mg 3/26   INR 3.42   Dose Held 3/27   INR 2.6 7 mg 3/28   INR 1.82 7.5 mg 3/29   INR 2.0      7.5 mg 3/30   INR 2.37 7.5 mg 3/31 Refused    Goal of Therapy:  INR 2-3 Monitor platelets by anticoagulation protocol: Yes   Plan:  Will order warfarin 5 mg x 1 and f/u AM INR.   Valentina Gu, [facility] 09/12/2016
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
## 2016-09-12 NOTE — Plan of Care (Signed)
Problem: Medication: Goal: Compliance with prescribed medication regimen will improve Outcome: Progressing Pt complaint with medications this shift.

## 2016-09-13 LAB — PROTIME-INR
INR: 2.36
Prothrombin Time: 26.2 seconds — ABNORMAL HIGH (ref 11.4–15.2)

## 2016-09-13 MED ORDER — WARFARIN SODIUM 7.5 MG PO TABS
7.5000 mg | ORAL_TABLET | Freq: Every day | ORAL | Status: DC
  Administered 2016-09-13: 7.5 mg via ORAL
  Filled 2016-09-13: qty 1

## 2016-09-13 NOTE — Progress Notes (Signed)
Pt denies SI/HI/AVH. Forwards little during assessment. Attended evening wrap up group. Visible in milieu with minimal interaction. Denies pain. Voices no additional concerns at this time. Safety maintained. Will continue to monitor

## 2016-09-13 NOTE — Progress Notes (Signed)
Patient out of bed for breakfast and medication pass. Patient noted un kempt   . continue tor wear  Scrubs . Needing a bath. Later in afternoon patient was able to shower. Limited  Interaction with his peers and staff . Voice of discharge  This week.  Patient stated slept good last night .Stated appetite is good and energy level  Is normal.  Stated he has no Depression Denies suicidal  homicidal ideations  .  Denies  auditory hallucinations , but is responding  During his pacing the halls   No pain concerns . Appropriate ADL'S. Interacting with peers and staff.  A: Encourage patient participation with unit programming . Instruction  Given on  Medication , verbalize understanding. R: Voice no other concerns. Staff continue to monitor
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
## 2016-09-13 NOTE — [facility] Group Notes (Signed)
[facility] LCSW Group Therapy  09/13/2016 2:21 PM  Type of Therapy:  Group Therapy  Participation Level:  Patient did not attend group. CSW invited patient to group.   Summary of Progress/Problems: Communications: Patients identify how individuals communicate with one another appropriately and inappropriately. Patients will be guided to discuss their thoughts, feelings, and behaviors related to barriers when communicating. The group will process together ways to execute positive and appropriate communications.   Anita Laguna G. Garnette Czech MSW, LCSWA 09/13/2016, 2:22 PM

## 2016-09-13 NOTE — Consult Note (Signed)
ANTICOAGULATION CONSULT NOTE -  Pharmacy Consult for warfarin Indication: Hx of PE  Allergies  Allergen Reactions  . Penicillins    Patient Measurements: Height:  (177.8 cm) Weight: 210 lb (95.3 kg) IBW/kg (Calculated) : 73  Labs:  Recent Labs  09/11/16 0703 09/13/16 0653  LABPROT 26.3* 26.2*  INR 2.37 2.36    Estimated Creatinine Clearance: 108.2 mL/min (by C-G formula based on SCr of 1.03 mg/dL).   Medical History: Past Medical History:  Diagnosis Date  . Depression   . Diabetes mellitus without complication (HCC)   . Hyperlipidemia   . Hypertension   . PE (pulmonary thromboembolism) (HCC)   . Schizo affective schizophrenia (HCC)     Medications:  Scheduled:  . buPROPion  150 mg Oral Daily  . cloZAPine  450 mg Oral QHS  . docusate sodium  200 mg Oral BID  . ipratropium  1 spray Nasal QHS  . lithium carbonate  450 mg Oral Q12H  . metFORMIN  1,000 mg Oral BID WC  . mometasone-formoterol  2 puff Inhalation BID  . nicotine  21 mg Transdermal Daily  . polyethylene glycol  17 g Oral Daily  . simvastatin  40 mg Oral q1800  . Warfarin - Pharmacist Dosing Inpatient   Does not apply q1800    Assessment: Pt is a 41 year old male who presents from his group home with N/V/D and visual hallucinations. Pt has a PMH of schizoaffective disorder bipolar type and PE on warfarin. INR on admission is low at 1.27. Group home reports that they give the patient 12.5mg  alternating with 2.5mg . Called the cardiologist office who states that his INR in the beginning of Feb was 1.2. He was instructed to take  daily and on recheck on 28 Feb INR was 2.5. He was told to continue to take this dose. Of note, office states that the group home has a hx of not bringing pt to appointments. Prior to Oct 2017 visit, pt had not been seen since march of 2017 for INR check. At pt last hospital stay pharmacy was dosing pt warfarin at 7.5mg  daily- INR was just therapeutic after about a  week.   3/19   INR 1.27    3/20  INR 1.55   8 mg 3/21   INR 1.73   8 mg 3/22   INR 1.84   8 mg 3/23   INR 2.06   8 mg 3/24                   8 mg  3/25   INR 2.76   8 mg 3/26   INR 3.42   Dose Held 3/27   INR 2.6 7 mg 3/28   INR 1.82 7.5 mg 3/29   INR 2.0      7.5 mg 3/30   INR 2.37 7.5 mg 3/31 Refused  5 mg 4/01 INR 2.36    Goal of Therapy:  INR 2-3 Monitor platelets by anticoagulation protocol: Yes   Plan:  Warfarin 7.5 mg daily and f/u AM INR.   Valentina Gu, [facility] 09/13/2016

## 2016-09-13 NOTE — Progress Notes (Signed)
Pt denies SI/HI/AVH. Forwards little during assessment. Attended evening wrap up group. Visible in milieu with minimal interaction. Denies pain. Voices no additional concerns at this time. Safety maintained. Will continue to monitor 

## 2016-09-14 LAB — CBC WITH DIFFERENTIAL/PLATELET
Basophils Absolute: 0 K/uL (ref 0–0.1)
Basophils Relative: 0 %
Eosinophils Absolute: 0 K/uL (ref 0–0.7)
Eosinophils Relative: 0 %
HCT: 47.1 % (ref 40.0–52.0)
Hemoglobin: 16.1 g/dL (ref 13.0–18.0)
Lymphocytes Relative: 26 %
Lymphs Abs: 1.7 K/uL (ref 1.0–3.6)
MCH: 30.9 pg (ref 26.0–34.0)
MCHC: 34.1 g/dL (ref 32.0–36.0)
MCV: 90.8 fL (ref 80.0–100.0)
Monocytes Absolute: 0.8 K/uL (ref 0.2–1.0)
Monocytes Relative: 12 %
Neutro Abs: 4 K/uL (ref 1.4–6.5)
Neutrophils Relative %: 62 %
Platelets: 242 K/uL (ref 150–440)
RBC: 5.19 MIL/uL (ref 4.40–5.90)
RDW: 13 % (ref 11.5–14.5)
WBC: 6.4 K/uL (ref 3.8–10.6)

## 2016-09-14 LAB — PROTIME-INR
INR: 1.99
Prothrombin Time: 22.9 s — ABNORMAL HIGH (ref 11.4–15.2)

## 2016-09-14 MED ORDER — WARFARIN SODIUM 7.5 MG PO TABS: 7.5000 mg | ORAL_TABLET | Freq: Every day | ORAL | 0 refills | Status: DC

## 2016-09-14 MED ORDER — POLYETHYLENE GLYCOL 3350 17 G PO PACK: 17.0000 g | PACK | Freq: Every day | ORAL | 0 refills | Status: DC

## 2016-09-14 MED ORDER — IPRATROPIUM BROMIDE 0.06 % NA SOLN: 1.0000 | Freq: Every day | NASAL | 0 refills | Status: DC

## 2016-09-14 MED ORDER — CLONAZEPAM 0.5 MG PO TABS: 0.5000 mg | ORAL_TABLET | Freq: Every day | ORAL | 0 refills | Status: DC

## 2016-09-14 MED ORDER — METFORMIN HCL ER (OSM) 1000 MG PO TB24: 1000.0000 mg | ORAL_TABLET | Freq: Two times a day (BID) | ORAL | 0 refills | Status: DC

## 2016-09-14 MED ORDER — BUPROPION HCL ER (XL) 150 MG PO TB24: 150.0000 mg | ORAL_TABLET | Freq: Every day | ORAL | 0 refills | Status: DC

## 2016-09-14 MED ORDER — DOCUSATE SODIUM 100 MG PO CAPS: 200.0000 mg | ORAL_CAPSULE | Freq: Two times a day (BID) | ORAL | 0 refills | Status: DC

## 2016-09-14 MED ORDER — CLOZAPINE 100 MG PO TABS: 450.0000 mg | ORAL_TABLET | Freq: Every day | ORAL | 0 refills | Status: DC

## 2016-09-14 MED ORDER — NICOTINE 10 MG IN INHA: 1.0000 | RESPIRATORY_TRACT | 0 refills | Status: DC | PRN

## 2016-09-14 MED ORDER — LITHIUM CARBONATE ER 450 MG PO TBCR: 450.0000 mg | EXTENDED_RELEASE_TABLET | Freq: Two times a day (BID) | ORAL | 0 refills | Status: DC

## 2016-09-14 NOTE — Progress Notes (Signed)
MEDICATION RELATED CONSULT NOTE  Pharmacy Consult for Clozapine Monitoring Indication: Schizphrenia  Allergies  Allergen Reactions  . Penicillins     Patient Measurements: Height:  (177.8 cm) Weight: 210 lb (95.3 kg) IBW/kg (Calculated) : 73  Vital Signs:   Intake/Output from previous day: 04/01 0701 - 04/02 0700 In: 720 [P.O.:720] Out: -  Intake/Output from this shift: Total I/O In: 360 [P.O.:360] Out: -   Labs:  Recent Labs  09/14/16 1049  WBC 6.4  HGB 16.1  HCT 47.1  PLT 242    Estimated Creatinine Clearance: 108.2 mL/min (by C-G formula based on SCr of 1.03 mg/dL).   Microbiology: No results found for this or any previous visit (from the past 720 hour(s)).  Medical History: Past Medical History:  Diagnosis Date  . Depression   . Diabetes mellitus without complication (HCC)   . Hyperlipidemia   . Hypertension   . PE (pulmonary thromboembolism) (HCC)   . Schizo affective schizophrenia (HCC)     Medications:  Scheduled:  . buPROPion  150 mg Oral Daily  . cloZAPine  450 mg Oral QHS  . docusate sodium  200 mg Oral BID  . ipratropium  1 spray Nasal QHS  . lithium carbonate  450 mg Oral Q12H  . metFORMIN  1,000 mg Oral BID WC  . mometasone-formoterol  2 puff Inhalation BID  . nicotine  21 mg Transdermal Daily  . polyethylene glycol  17 g Oral Daily  . simvastatin  40 mg Oral q1800  . warfarin  7.5 mg Oral q1800  . Warfarin - Pharmacist Dosing Inpatient   Does not apply q1800    Assessment: 41 yo male with schizoaffective disorder bipolar type  3/26:  ANC= 3.5 4/2:   ANC= 4.0  Goal of Therapy:  Avoid adverse effects from Clozapine  Plan:  Clozapine Registry eligible for dispensing DOB Sep 29, 1975 [place] [address] Dr. Toni Amend  [id number]  Next ANC due:  09/21/2016  (Weekly while inpatient)  Bari Mantis PharmD Clinical Pharmacist 09/14/2016

## 2016-09-14 NOTE — [facility] Group Notes (Signed)
[facility] LCSW Group Therapy   09/14/2016 1:00 pm Type of Therapy: Group Therapy   Participation Level: Active   Participation Quality: Attentive, Sharing and Supportive   Affect: Appropriate   Cognitive: Alert and Oriented   Insight: Developing/Improving and Engaged   Engagement in Therapy: Developing/Improving and Engaged   Modes of Intervention: Clarification, Confrontation, Discussion, Education, Exploration,  Limit-setting, Orientation, Problem-solving, Rapport Building, Dance movement psychotherapist, Socialization and Support   Summary of Progress/Problems: Pt identified obstacles faced currently and processed barriers involved in overcoming these obstacles. Pt identified steps necessary for overcoming these obstacles and explored motivation (internal and external) for facing these difficulties head on. Pt further identified one area of concern in their lives and chose a goal to focus on for today. Pt defined obstacle as "a problem." He identified lack of sleep  as an obstacle. Pt identified healthy coping mechanisms to implement in his life to engage appropriately in recovery.   Hampton Abbot, MSW, LCSWA 09/14/2016, 1:48PM

## 2016-09-14 NOTE — [facility] Suicide Risk Assessment (Signed)
[facility] Discharge Suicide Risk Assessment   Principal Problem: Schizoaffective disorder, bipolar type [facility]) Discharge Diagnoses:  Patient Active Problem List   Diagnosis Date Noted  . Tobacco use disorder [F17.200] 09/01/2016  . Schizoaffective disorder, bipolar type (HCC) [F25.0] 05/22/2016  . Diabetes mellitus without complication (HCC) [E11.9] 05/21/2016  . Hyperlipidemia [E78.5] 05/21/2016  . History of pulmonary embolism [Z86.711] 05/21/2016  . Chronic anticoagulation [Z79.01] 05/21/2016  . Hypertension [I10] 09/25/2015     Psychiatric Specialty Exam: ROS  Blood pressure 111/69, pulse 72, temperature 98.2 F (36.8 C), temperature source Oral, resp. rate 18, height  (1.778 m), weight 95.3 kg (210 lb), SpO2 98 %.Body mass index is 30.13 kg/m.                                                       Mental Status Per Nursing Assessment::   On Admission:  NA  Demographic Factors:  Male and Caucasian  Loss Factors: Decrease in vocational status  Historical Factors: Family history of mental illness or substance abuse  Risk Reduction Factors:   Positive social support No access to guns  Continued Clinical Symptoms:  Previous Psychiatric Diagnoses and Treatments  Cognitive Features That Contribute To Risk:  Loss of executive function    Suicide Risk:  Minimal: No identifiable suicidal ideation.  Patients presenting with no risk factors but with morbid ruminations; may be classified as minimal risk based on the severity of the depressive symptoms     Jimmy Footman, MD 09/14/2016, 11:33 AM

## 2016-09-14 NOTE — Progress Notes (Signed)
Recreation Therapy Notes  Date: 04.02.18 Time: 9:30 am Location: Craft Room  Group Topic: Self-expression  Goal Area(s) Addresses:  Patient will identify one color per emotion listed on wheel. Patient will verbalize benefit of using art as a means of self-expression. Patient will verbalize one emotion experienced during session. Patient will be educated on other forms of self-expression.  Behavioral Response: Did not attend  Intervention: Emotion Wheel  Activity: Patients were given an Emotion Wheel worksheet and were instructed to pick a color for each emotion listed on the worksheet.  Education: LRT educated patients on other forms of self-expression.  Education Outcome: Patient did not attend group.  Clinical Observations/Feedback: Patient did not attend group.  Jacquelynn Cree, LRT/CTRS 09/14/2016 9:58 AM

## 2016-09-14 NOTE — [place] FL2 (Signed)
[place] MEDICAID FL2 LEVEL OF CARE SCREENING TOOL     IDENTIFICATION  Patient Name: Lance [name] Birthdate: [birth year] Sex: male Admission Date (Current Location): 08/31/2016  [place] and [place] Number:  [name] [id number] [facility] Facility and Address:  [facility], [address] [place], [place] [address]      Provider Number: [id number]  Attending Physician Name and Address:  Barnabas Harries*  Relative Name and Phone Number:       Current Level of Care: Hospital Recommended Level of Care: Family Care Home Prior Approval Number:    Date Approved/Denied:   PASRR Number:    Discharge Plan: Other (Comment) (Family Care Home)    Current Diagnoses: Patient Active Problem List   Diagnosis Date Noted  . Tobacco use disorder 09/01/2016  . Schizoaffective disorder, bipolar type (HCC) 05/22/2016  . Diabetes mellitus without complication (HCC) 05/21/2016  . Hyperlipidemia 05/21/2016  . History of pulmonary embolism 05/21/2016  . Chronic anticoagulation 05/21/2016  . Hypertension 09/25/2015    Orientation RESPIRATION BLADDER Height & Weight     Self, Time, Situation, Place  Normal Continent Weight: 210 lb (95.3 kg) Height:   (177.8 cm)  BEHAVIORAL SYMPTOMS/MOOD NEUROLOGICAL BOWEL NUTRITION STATUS   (na)  (na) Continent  (na)  AMBULATORY STATUS COMMUNICATION OF NEEDS Skin   Independent Verbally Normal                       Personal Care Assistance Level of Assistance   (na)           Functional Limitations Info   (na)          SPECIAL CARE FACTORS FREQUENCY   (diabetes)                    Contractures Contractures Info: Not present    Additional Factors Info   (na)               Current Medications (09/14/2016):  This is the current hospital active medication list Current Facility-Administered Medications  Medication Dose Route Frequency Provider Last Rate Last Dose  . acetaminophen (TYLENOL)  tablet 650 mg  650 mg Oral Q6H PRN Audery Amel, MD      . alum & mag hydroxide-simeth (MAALOX/MYLANTA) 200-200-20 MG/5ML suspension 30 mL  30 mL Oral Q4H PRN Audery Amel, MD      . buPROPion (WELLBUTRIN XL) 24 hr tablet 150 mg  150 mg Oral Daily Jimmy Footman, MD   150 mg at 09/14/16 9562  . cloZAPine (CLOZARIL) tablet 450 mg  450 mg Oral QHS Jimmy Footman, MD   450 mg at 09/13/16 2012  . docusate sodium (COLACE) capsule 200 mg  200 mg Oral BID Jimmy Footman, MD   200 mg at 09/14/16 0810  . ipratropium (ATROVENT) 0.06 % nasal spray 1 spray  1 spray Nasal QHS Jimmy Footman, MD   1 spray at 09/10/16 2121  . lithium carbonate (ESKALITH) CR tablet 450 mg  450 mg Oral Q12H Jimmy Footman, MD   450 mg at 09/14/16 0800  . magnesium hydroxide (MILK OF MAGNESIA) suspension 30 mL  30 mL Oral Daily PRN Audery Amel, MD      . metFORMIN (GLUCOPHAGE-XR) 24 hr tablet 1,000 mg  1,000 mg Oral BID WC Jimmy Footman, MD   1,000 mg at 09/14/16 0811  . mometasone-formoterol (DULERA) 200-5 MCG/ACT inhaler 2 puff  2 puff Inhalation BID Audery Amel, MD  2 puff at 09/14/16 0811  . nicotine (NICODERM CQ - dosed in mg/24 hours) patch 21 mg  21 mg Transdermal Daily Jimmy Footman, MD   21 mg at 09/13/16 0757  . polyethylene glycol (MIRALAX / GLYCOLAX) packet 17 g  17 g Oral Daily Jimmy Footman, MD   17 g at 09/14/16 1610  . simvastatin (ZOCOR) tablet 40 mg  40 mg Oral q1800 Jimmy Footman, MD   40 mg at 09/13/16 2014  . warfarin (COUMADIN) tablet 7.5 mg  7.5 mg Oral q1800 Jimmy Footman, MD   7.5 mg at 09/13/16 1709  . Warfarin - Pharmacist Dosing Inpatient   Does not apply q1800 Jimmy Footman, MD         Discharge Medications: Please see discharge summary for a list of discharge medications.  Relevant Imaging Results:  Relevant Lab Results:   Additional  Information None  Lorri Frederick, LCSW

## 2016-09-14 NOTE — Progress Notes (Signed)
Patient discharged home. DC instructions provided and explained. Medications reviewed. Rx given. All questions answered. Pt stable at discharge. Denies SI, HI, AVH. Belongings returned. AVS given with transition and risk assessment

## 2016-09-14 NOTE — Consult Note (Signed)
ANTICOAGULATION CONSULT NOTE -  Pharmacy Consult for warfarin Indication: Hx of PE  Allergies  Allergen Reactions  . Penicillins    Patient Measurements: Height:  (177.8 cm) Weight: 210 lb (95.3 kg) IBW/kg (Calculated) : 73  Labs:  Recent Labs  09/13/16 0653 09/14/16 1049  HGB  --  16.1  HCT  --  47.1  PLT  --  242  LABPROT 26.2* 22.9*  INR 2.36 1.99    Estimated Creatinine Clearance: 108.2 mL/min (by C-G formula based on SCr of 1.03 mg/dL).   Medical History: Past Medical History:  Diagnosis Date  . Depression   . Diabetes mellitus without complication (HCC)   . Hyperlipidemia   . Hypertension   . PE (pulmonary thromboembolism) (HCC)   . Schizo affective schizophrenia (HCC)     Medications:  Scheduled:  . buPROPion  150 mg Oral Daily  . cloZAPine  450 mg Oral QHS  . docusate sodium  200 mg Oral BID  . ipratropium  1 spray Nasal QHS  . lithium carbonate  450 mg Oral Q12H  . metFORMIN  1,000 mg Oral BID WC  . mometasone-formoterol  2 puff Inhalation BID  . nicotine  21 mg Transdermal Daily  . polyethylene glycol  17 g Oral Daily  . simvastatin  40 mg Oral q1800  . warfarin  7.5 mg Oral q1800  . Warfarin - Pharmacist Dosing Inpatient   Does not apply q1800    Assessment: Pt is a 41 year old male who presents from his group home with N/V/D and visual hallucinations. Pt has a PMH of schizoaffective disorder bipolar type and PE on warfarin. INR on admission is low at 1.27. Group home reports that they give the patient 12.5mg  alternating with 2.5mg . Called the cardiologist office who states that his INR in the beginning of Feb was 1.2. He was instructed to take  daily and on recheck on 28 Feb INR was 2.5. He was told to continue to take this dose. Of note, office states that the group home has a hx of not bringing pt to appointments. Prior to Oct 2017 visit, pt had not been seen since march of 2017 for INR check. At pt last hospital stay pharmacy was  dosing pt warfarin at 7.5mg  daily- INR was just therapeutic after about a week.   3/19   INR 1.27    3/20  INR 1.55   8 mg 3/21   INR 1.73   8 mg 3/22   INR 1.84   8 mg 3/23   INR 2.06   8 mg 3/24                   8 mg  3/25   INR 2.76   8 mg 3/26   INR 3.42   Dose Held 3/27   INR 2.6 7 mg 3/28   INR 1.82 7.5 mg 3/29   INR 2.0      7.5 mg 3/30   INR 2.37 7.5 mg 3/31 Refused  5 mg 4/01 INR 2.36 7.5 mg 4/02 INR 1.99   Goal of Therapy:  INR 2-3 Monitor platelets by anticoagulation protocol: Yes   Plan:  Warfarin 7.5 mg daily and f/u AM INR.  Consider scheduling labs after breakfast as patient has refused early labs.  Cayle Thunder A, [facility] 09/14/2016
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
## 2016-09-14 NOTE — Progress Notes (Signed)
  [facility] Adult Case Management Discharge Plan :  Will you be returning to the same living situation after discharge:  Yes,  with group home At discharge, do you have transportation home?: Yes,  group home Do you have the ability to pay for your medications: Yes,  medicare/medicaid  Release of information consent forms completed and in the chart;  Patient's signature needed at discharge.  Patient to Follow up at: Follow-up Information    [name] [employer]. Go on 09/15/2016.   Why:  Please attend your follow up appointment at [facility] on 09/15/16 at 10:15 AM.  Your ACT team will also come to see you tomorrow, 09/15/16.  Please bring your hospital discharge paperwork. Contact information: [address] [place] [address] [phone]           Next level of care provider has access to [facility] Link:no  Safety Planning and Suicide Prevention discussed: Yes,  with legal guardian  Have you used any form of tobacco in the last 30 days? (Cigarettes, Smokeless Tobacco, Cigars, and/or Pipes): No  Has patient been referred to the Quitline?: Patient refused referral  Patient has been referred for addiction treatment: N/A  Lorri Frederick, LCSW 09/14/2016, 1:14 PM

## 2016-09-14 NOTE — Tx Team (Signed)
Interdisciplinary Treatment and Diagnostic Plan Update  09/14/2016 Time of Session: 11:00am Lance [name] MRN: [id number]  Principal Diagnosis: Schizoaffective disorder, bipolar type [facility]  Secondary Diagnoses: Principal Problem:   Schizoaffective disorder, bipolar type (HCC) Active Problems:   Hypertension   Diabetes mellitus without complication (HCC)   Hyperlipidemia   History of pulmonary embolism   Chronic anticoagulation   Tobacco use disorder   Current Medications:  Current Facility-Administered Medications  Medication Dose Route Frequency Provider Last Rate Last Dose  . acetaminophen (TYLENOL) tablet 650 mg  650 mg Oral Q6H PRN Audery Amel, MD      . alum & mag hydroxide-simeth (MAALOX/MYLANTA) 200-200-20 MG/5ML suspension 30 mL  30 mL Oral Q4H PRN Audery Amel, MD      . buPROPion (WELLBUTRIN XL) 24 hr tablet 150 mg  150 mg Oral Daily Jimmy Footman, MD   150 mg at 09/14/16 4098  . cloZAPine (CLOZARIL) tablet 450 mg  450 mg Oral QHS Jimmy Footman, MD   450 mg at 09/13/16 2012  . docusate sodium (COLACE) capsule 200 mg  200 mg Oral BID Jimmy Footman, MD   200 mg at 09/14/16 0810  . ipratropium (ATROVENT) 0.06 % nasal spray 1 spray  1 spray Nasal QHS Jimmy Footman, MD   1 spray at 09/10/16 2121  . lithium carbonate (ESKALITH) CR tablet 450 mg  450 mg Oral Q12H Jimmy Footman, MD   450 mg at 09/14/16 0800  . magnesium hydroxide (MILK OF MAGNESIA) suspension 30 mL  30 mL Oral Daily PRN Audery Amel, MD      . metFORMIN (GLUCOPHAGE-XR) 24 hr tablet 1,000 mg  1,000 mg Oral BID WC Jimmy Footman, MD   1,000 mg at 09/14/16 0811  . mometasone-formoterol (DULERA) 200-5 MCG/ACT inhaler 2 puff  2 puff Inhalation BID Audery Amel, MD   2 puff at 09/14/16 [phone]  . nicotine (NICODERM CQ - dosed in mg/24 hours) patch 21 mg  21 mg Transdermal Daily Jimmy Footman, MD   21 mg at 09/13/16 0757  . polyethylene  glycol (MIRALAX / GLYCOLAX) packet 17 g  17 g Oral Daily Jimmy Footman, MD   17 g at 09/14/16 4782  . simvastatin (ZOCOR) tablet 40 mg  40 mg Oral q1800 Jimmy Footman, MD   40 mg at 09/13/16 2014  . warfarin (COUMADIN) tablet 7.5 mg  7.5 mg Oral q1800 Jimmy Footman, MD   7.5 mg at 09/13/16 1709  . Warfarin - Pharmacist Dosing Inpatient   Does not apply q1800 Jimmy Footman, MD       PTA Medications: Prescriptions Prior to Admission  Medication Sig Dispense Refill Last Dose  . cloZAPine (CLOZARIL) 100 MG tablet Take 100-300 mg by mouth 2 (two) times daily.  in the morning and  at bedtime   unknown at unknown  . Fluticasone-Salmeterol (ADVAIR) 250-50 MCG/DOSE AEPB Inhale 1 puff into the lungs 2 (two) times daily.   unknown at unknown  . lithium carbonate 300 MG capsule Take 900 mg by mouth at bedtime.   unknown at unknown  . loperamide (IMODIUM A-D) 2 MG tablet Take 1 tablet (2 mg total) by mouth 4 (four) times daily as needed for diarrhea or loose stools. 12 tablet 0 prn at prn  . metFORMIN (GLUCOPHAGE) 1000 MG tablet Take 1,000 mg by mouth 2 (two) times daily with a meal.   unknown at unknown  . omega-3 acid ethyl esters (LOVAZA) 1 g capsule Take 1 g by mouth  daily.   unknown at unknown  . ondansetron (ZOFRAN ODT) 4 MG disintegrating tablet Take 1 tablet (4 mg total) by mouth every 8 (eight) hours as needed for nausea or vomiting. 20 tablet 0 prn at prn  . PARoxetine (PAXIL-CR) 25 MG 24 hr tablet Take 25 mg by mouth daily.   unknown at unknown  . simvastatin (ZOCOR) 40 MG tablet Take 40 mg by mouth at bedtime.   unknown at unknown  . traZODone (DESYREL) 100 MG tablet Take 150 mg by mouth at bedtime.    unknown at unknown  . warfarin (COUMADIN) 10 MG tablet Take 10 mg by mouth every other day.   unknown at unknown  . ziprasidone (GEODON) 80 MG capsule Take 80 mg by mouth 2 (two) times daily with a meal.   unknown at unknown    Patient  Stressors: Health problems Medication change or noncompliance  Patient Strengths: Motivation for treatment/growth Physical Health  Treatment Modalities: Medication Management, Group therapy, Case management,  1 to 1 session with clinician, Psychoeducation, Recreational therapy.   Physician Treatment Plan for Primary Diagnosis: Schizoaffective disorder, bipolar type (HCC) Long Term Goal(s): Improvement in symptoms so as ready for discharge Improvement in symptoms so as ready for discharge   Short Term Goals: Ability to identify and develop effective coping behaviors will improve Ability to identify triggers associated with substance abuse/mental health issues will improve Ability to identify changes in lifestyle to reduce recurrence of condition will improve Ability to verbalize feelings will improve  Medication Management: Evaluate patient's response, side effects, and tolerance of medication regimen.  Therapeutic Interventions: 1 to 1 sessions, Unit Group sessions and Medication administration.  Evaluation of Outcomes: Adequate for Discharge  Physician Treatment Plan for Secondary Diagnosis: Principal Problem:   Schizoaffective disorder, bipolar type (HCC) Active Problems:   Hypertension   Diabetes mellitus without complication (HCC)   Hyperlipidemia   History of pulmonary embolism   Chronic anticoagulation   Tobacco use disorder  Long Term Goal(s): Improvement in symptoms so as ready for discharge Improvement in symptoms so as ready for discharge   Short Term Goals: Ability to identify and develop effective coping behaviors will improve Ability to identify triggers associated with substance abuse/mental health issues will improve Ability to identify changes in lifestyle to reduce recurrence of condition will improve Ability to verbalize feelings will improve     Medication Management: Evaluate patient's response, side effects, and tolerance of medication  regimen.  Therapeutic Interventions: 1 to 1 sessions, Unit Group sessions and Medication administration.  Evaluation of Outcomes: Adequate for Discharge   RN Treatment Plan for Primary Diagnosis: Schizoaffective disorder, bipolar type (HCC) Long Term Goal(s): Knowledge of disease and therapeutic regimen to maintain health will improve  Short Term Goals: Ability to remain free from injury will improve, Ability to demonstrate self-control, Ability to disclose and discuss suicidal ideas, Ability to identify and develop effective coping behaviors will improve and Compliance with prescribed medications will improve  Medication Management: RN will administer medications as ordered by provider, will assess and evaluate patient's response and provide education to patient for prescribed medication. RN will report any adverse and/or side effects to prescribing provider.  Therapeutic Interventions: 1 on 1 counseling sessions, Psychoeducation, Medication administration, Evaluate responses to treatment, Monitor vital signs and CBGs as ordered, Perform/monitor CIWA, COWS, AIMS and Fall Risk screenings as ordered, Perform wound care treatments as ordered.  Evaluation of Outcomes: Adequate for Discharge   LCSW Treatment Plan for Primary Diagnosis: Schizoaffective disorder, bipolar  type [facility]) Long Term Goal(s): Safe transition to appropriate next level of care at discharge, Engage patient in therapeutic group addressing interpersonal concerns.  Short Term Goals: Engage patient in aftercare planning with referrals and resources, Increase ability to appropriately verbalize feelings, Facilitate acceptance of mental health diagnosis and concerns and Increase skills for wellness and recovery  Therapeutic Interventions: Assess for all discharge needs, 1 to 1 time with Social worker, Explore available resources and support systems, Assess for adequacy in community support network, Educate family and significant  other(s) on suicide prevention, Complete Psychosocial Assessment, Interpersonal group therapy.  Evaluation of Outcomes: Adequate for Discharge   Progress in Treatment: Attending groups: Yes. Participating in groups: Yes. Taking medication as prescribed: Yes. Toleration medication: Yes. Family/Significant other contact made: Yes, individual(s) contacted:  legal guardian Patient understands diagnosis: Yes. Discussing patient identified problems/goals with staff: Yes. Medical problems stabilized or resolved: Yes. Denies suicidal/homicidal ideation: Yes. Issues/concerns per patient self-inventory: No. Other: n/a  New problem(s) identified: None identified at this time.   New Short Term/Long Term Goal(s): None identified at this time.   Discharge Plan or Barriers: Patient will discharge back to group home and follow-up with [facility].   Reason for Continuation of Hospitalization: PT discharging today.  Estimated Length of Stay: Discharging today.  Attendees: Patient: 09/14/2016  Physician: Dr. Ardyth Harps, MD 09/14/2016   Nursing: Leonia Reader, RN 09/14/2016   RN Care Manager: 09/14/2016   Social Worker: Daleen Squibb, LCSW 09/14/2016   Recreational Therapist: Hershal Coria, LRT/CTRS  09/14/2016   Other:  09/14/2016   Other:  09/14/2016   Other: 09/14/2016     Scribe for Treatment Team: Lorri Frederick, LCSW 09/14/2016 1:19 PM

## 2016-09-15 NOTE — Progress Notes (Signed)
Recreation Therapy Notes  INPATIENT RECREATION TR PLAN  Patient Details Name: CON ARGANBRIGHT MRN: [id number] DOB: [birth year] Today's Date: 09/15/2016  Rec Therapy Plan Is patient appropriate for Therapeutic Recreation?: Yes Treatment times per week: At least once a week TR Treatment/Interventions: 1:1 session, Group participation (Comment) (Appropriate participation in daily recreational therapy tx)  Discharge Criteria Pt will be discharged from therapy if:: Treatment goals are met, Discharged Treatment plan/goals/alternatives discussed and agreed upon by:: Patient/family  Discharge Summary Short term goals set: See Care Plan Short term goals met: Complete Progress toward goals comments: One-to-one attended One-to-one attended: Anger management, stress management Reason goals not met: N/A Therapeutic equipment acquired: None Reason patient discharged from therapy: Discharge from hospital Pt/family agrees with progress & goals achieved: Yes Date patient discharged from therapy: 09/14/16   Leonette Monarch, LRT/CTRS 09/15/2016, 1:38 PM

## 2016-09-16 NOTE — Progress Notes (Signed)
Late entry note: CSW spoke to legal guardian [name] on 09/14/16 by phone and informed her that pt was discharging today and would be returning to his group home and ACT team provider. Garner Nash, MSW, LCSW Clinical Social Worker 09/16/2016 8:31 AM

## 2016-10-07 DIAGNOSIS — Z79899 Other long term (current) drug therapy: Secondary | ICD-10-CM | POA: Diagnosis not present

## 2016-10-07 DIAGNOSIS — F209 Schizophrenia, unspecified: Secondary | ICD-10-CM | POA: Diagnosis not present

## 2016-11-13 DIAGNOSIS — Z79899 Other long term (current) drug therapy: Secondary | ICD-10-CM | POA: Diagnosis not present

## 2016-11-13 DIAGNOSIS — F209 Schizophrenia, unspecified: Secondary | ICD-10-CM | POA: Diagnosis not present

## 2016-11-18 ENCOUNTER — Encounter: Payer: Self-pay | Admitting: Emergency Medicine

## 2016-11-18 ENCOUNTER — Inpatient Hospital Stay
Admission: EM | Admit: 2016-11-18 | Discharge: 2016-11-20 | DRG: 948 | Disposition: A | Discharge status: Home / Self Care | Payer: Medicare Other | Attending: Internal Medicine | Admitting: Internal Medicine

## 2016-11-18 DIAGNOSIS — Z915 Personal history of self-harm: Secondary | ICD-10-CM

## 2016-11-18 DIAGNOSIS — R45851 Suicidal ideations: Secondary | ICD-10-CM | POA: Diagnosis present

## 2016-11-18 DIAGNOSIS — R791 Abnormal coagulation profile: Principal | ICD-10-CM | POA: Diagnosis present

## 2016-11-18 DIAGNOSIS — I10 Essential (primary) hypertension: Secondary | ICD-10-CM | POA: Diagnosis present

## 2016-11-18 DIAGNOSIS — F259 Schizoaffective disorder, unspecified: Secondary | ICD-10-CM | POA: Diagnosis present

## 2016-11-18 DIAGNOSIS — Z880 Allergy status to penicillin: Secondary | ICD-10-CM

## 2016-11-18 DIAGNOSIS — E119 Type 2 diabetes mellitus without complications: Secondary | ICD-10-CM | POA: Diagnosis not present

## 2016-11-18 DIAGNOSIS — Z86711 Personal history of pulmonary embolism: Secondary | ICD-10-CM

## 2016-11-18 DIAGNOSIS — Z79899 Other long term (current) drug therapy: Secondary | ICD-10-CM

## 2016-11-18 DIAGNOSIS — Z8249 Family history of ischemic heart disease and other diseases of the circulatory system: Secondary | ICD-10-CM

## 2016-11-18 DIAGNOSIS — E876 Hypokalemia: Secondary | ICD-10-CM | POA: Diagnosis present

## 2016-11-18 DIAGNOSIS — F250 Schizoaffective disorder, bipolar type: Secondary | ICD-10-CM | POA: Diagnosis present

## 2016-11-18 DIAGNOSIS — R310 Gross hematuria: Secondary | ICD-10-CM | POA: Diagnosis present

## 2016-11-18 DIAGNOSIS — E785 Hyperlipidemia, unspecified: Secondary | ICD-10-CM | POA: Diagnosis present

## 2016-11-18 DIAGNOSIS — R319 Hematuria, unspecified: Secondary | ICD-10-CM | POA: Diagnosis not present

## 2016-11-18 DIAGNOSIS — F319 Bipolar disorder, unspecified: Secondary | ICD-10-CM | POA: Diagnosis present

## 2016-11-18 DIAGNOSIS — Z7901 Long term (current) use of anticoagulants: Secondary | ICD-10-CM

## 2016-11-18 DIAGNOSIS — F17210 Nicotine dependence, cigarettes, uncomplicated: Secondary | ICD-10-CM | POA: Diagnosis present

## 2016-11-18 DIAGNOSIS — Z794 Long term (current) use of insulin: Secondary | ICD-10-CM

## 2016-11-18 HISTORY — DX: Lupus anticoagulant syndrome: D68.62

## 2016-11-18 LAB — URINALYSIS, COMPLETE (UACMP) WITH MICROSCOPIC
BACTERIA UA: NONE SEEN
Specific Gravity, Urine: 1.02 (ref 1.005–1.030)
Squamous Epithelial / LPF: NONE SEEN

## 2016-11-18 LAB — COMPREHENSIVE METABOLIC PANEL
ALBUMIN: 4.2 g/dL (ref 3.5–5.0)
ALT: 31 U/L (ref 17–63)
AST: 31 U/L (ref 15–41)
Alkaline Phosphatase: 57 U/L (ref 38–126)
Anion gap: 7 (ref 5–15)
BUN: 16 mg/dL (ref 6–20)
CHLORIDE: 112 mmol/L — AB (ref 101–111)
CO2: 25 mmol/L (ref 22–32)
CREATININE: 1.04 mg/dL (ref 0.61–1.24)
Calcium: 9.4 mg/dL (ref 8.9–10.3)
GFR calc non Af Amer: >60 mL/min (ref >60)
Glucose, Bld: 103 mg/dL — ABNORMAL HIGH (ref 65–99)
Potassium: 3.3 mmol/L — ABNORMAL LOW (ref 3.5–5.1)
SODIUM: 144 mmol/L (ref 135–145)
Total Bilirubin: 0.5 mg/dL (ref 0.3–1.2)
Total Protein: 6.7 g/dL (ref 6.5–8.1)

## 2016-11-18 LAB — CBC WITH DIFFERENTIAL/PLATELET
BASOS ABS: 0 10*3/uL (ref 0–0.1)
BASOS PCT: 0 %
EOS ABS: 0 10*3/uL (ref 0–0.7)
Eosinophils Relative: 0 %
HCT: 42.9 % (ref 40.0–52.0)
Hemoglobin: 15 g/dL (ref 13.0–18.0)
Lymphocytes Relative: 26 %
Lymphs Abs: 1.6 10*3/uL (ref 1.0–3.6)
MCH: 31 pg (ref 26.0–34.0)
MCHC: 35.1 g/dL (ref 32.0–36.0)
MCV: 88.2 fL (ref 80.0–100.0)
Monocytes Absolute: 0.7 10*3/uL (ref 0.2–1.0)
Monocytes Relative: 11 %
Neutro Abs: 3.9 10*3/uL (ref 1.4–6.5)
Neutrophils Relative %: 63 %
PLATELETS: 198 10*3/uL (ref 150–440)
RBC: 4.86 MIL/uL (ref 4.40–5.90)
RDW: 12.9 % (ref 11.5–14.5)
WBC: 6.3 10*3/uL (ref 3.8–10.6)

## 2016-11-18 LAB — PROTIME-INR: Prothrombin Time: >90 seconds — ABNORMAL HIGH (ref 11.4–15.2)

## 2016-11-18 LAB — ABO/RH: ABO/RH(D): A POS

## 2016-11-18 MED ORDER — DEXTROSE 5 % IV SOLN
10.0000 mg | Freq: Once | INTRAVENOUS | Status: AC
  Administered 2016-11-18: 10 mg via INTRAVENOUS
  Filled 2016-11-18 (×2): qty 1

## 2016-11-18 MED ORDER — SODIUM CHLORIDE 0.9 % IV SOLN
10.0000 mL/h | Freq: Once | INTRAVENOUS | Status: AC
  Administered 2016-11-18: 10 mL/h via INTRAVENOUS

## 2016-11-18 NOTE — ED Notes (Signed)
ED Provider at bedside. 

## 2016-11-18 NOTE — ED Notes (Signed)
Date and time results received: 11/18/16 2209 (use smartphrase ".now" to insert current time)  Test: INR Critical Value: >10  Name of Provider Notified: Mayford KnifeWilliams  Orders Received? Or Actions Taken?: Orders Received - See Orders for details

## 2016-11-18 NOTE — ED Triage Notes (Signed)
Pt presents to ED with blood in his urine today. Denies hx of the same. Denies pain. No clots present per pt.

## 2016-11-18 NOTE — ED Provider Notes (Signed)
[facility] Emergency Department Provider Note   [facility]    Time seen: ----------------------------------------- 8:55 PM on 11/18/2016 -----------------------------------------     I have reviewed the triage vital signs and the nursing notes.   HISTORY   Chief Complaint Hematuria    HPI Lance [name] is a 41 y.o. male who presents to the ED for hematuria today. Patient denies history of same, denies pain or clots. He is on Coumadin for previous PE. Patient denies any recent change in his medication dosage. He denies fevers, chills, dysuria or other complaints. Earlier he states he had blood mixed with urine but now he is urinating pure blood.   Past Medical History:  Diagnosis Date  . Depression   . Diabetes mellitus without complication (HCC)   . Hyperlipidemia   . Hypertension   . PE (pulmonary thromboembolism) (HCC)   . Schizo affective schizophrenia [facility](HCC)     Patient Active Problem List   Diagnosis Date Noted  . Tobacco use disorder 09/01/2016  . Schizoaffective disorder, bipolar type (HCC) 05/22/2016  . Diabetes mellitus without complication (HCC) 05/21/2016  . Hyperlipidemia 05/21/2016  . History of pulmonary embolism 05/21/2016  . Chronic anticoagulation 05/21/2016  . Hypertension 09/25/2015    History reviewed. No pertinent surgical history.  Allergies Penicillins  Social History Social History  Substance Use Topics  . Smoking status: Current Every Day Smoker    Packs/day: 1.00    Years: 23.00    Types: Cigarettes  . Smokeless tobacco: Never Used  . Alcohol use No     Comment: occassionally    Review of Systems Constitutional: Negative for fever. Cardiovascular: Negative for chest pain. Respiratory: Negative for shortness of breath. Gastrointestinal: Negative for abdominal pain, vomiting and diarrhea. Genitourinary: Negative for dysuria.Positive for hematuria Musculoskeletal: Negative for back pain. Skin: Negative for  rash. Neurological: Negative for headaches, focal weakness or numbness.  All systems negative/normal/unremarkable except as stated in the HPI  ____________________________________________   PHYSICAL EXAM:  VITAL SIGNS: ED Triage Vitals [11/18/16 2028]  Enc Vitals Group     BP (!) 141/83     Pulse Rate 79     Resp 18     Temp 98.4 F (36.9 C)     Temp Source Oral     SpO2 97 %     Weight 210 lb (95.3 kg)     Height 5' 10.5" (1.791 m)     Head Circumference      Peak Flow      Pain Score      Pain Loc      Pain Edu?      Excl. in GC?     Constitutional: Alert and oriented. Well appearing and in no distress. Eyes: Conjunctivae are normal. Normal extraocular movements. ENT   Head: Normocephalic and atraumatic.   Nose: No congestion/rhinnorhea.   Mouth/Throat: Mucous membranes are moist.   Neck: No stridor. Cardiovascular: Normal rate, regular rhythm. No murmurs, rubs, or gallops. Respiratory: Normal respiratory effort without tachypnea nor retractions. Breath sounds are clear and equal bilaterally. No wheezes/rales/rhonchi. Gastrointestinal: Soft and nontender. Normal bowel sounds Musculoskeletal: Nontender with normal range of motion in extremities. No lower extremity tenderness nor edema. Neurologic:  Normal speech and language. No gross focal neurologic deficits are appreciated.  Skin:  Skin is warm, dry and intact. No rash noted. Psychiatric: Mood and affect are normal. Speech and behavior are normal.  ___________________________________________  ED COURSE:  Pertinent labs & imaging results that were available during  my care of the patient were reviewed by me and considered in my medical decision making (see chart for details). Patient presents for hematuria, we will assess with labs and imaging as indicated.   Procedures ____________________________________________   LABS (pertinent positives/negatives)  Labs Reviewed  URINALYSIS, COMPLETE (UACMP)  WITH MICROSCOPIC - Abnormal; Notable for the following:       Result Value   Color, Urine RED (*)    APPearance CLOUDY (*)    Glucose, UA   (*)    Value: TEST NOT REPORTED DUE TO COLOR INTERFERENCE OF URINE PIGMENT   Hgb urine dipstick   (*)    Value: TEST NOT REPORTED DUE TO COLOR INTERFERENCE OF URINE PIGMENT   Bilirubin Urine   (*)    Value: TEST NOT REPORTED DUE TO COLOR INTERFERENCE OF URINE PIGMENT   Ketones, ur   (*)    Value: TEST NOT REPORTED DUE TO COLOR INTERFERENCE OF URINE PIGMENT   Protein, ur   (*)    Value: TEST NOT REPORTED DUE TO COLOR INTERFERENCE OF URINE PIGMENT   Nitrite   (*)    Value: TEST NOT REPORTED DUE TO COLOR INTERFERENCE OF URINE PIGMENT   Leukocytes, UA   (*)    Value: TEST NOT REPORTED DUE TO COLOR INTERFERENCE OF URINE PIGMENT   All other components within normal limits  COMPREHENSIVE METABOLIC PANEL - Abnormal; Notable for the following:    Potassium 3.3 (*)    Chloride 112 (*)    Glucose, Bld 103 (*)    All other components within normal limits  PROTIME-INR - Abnormal; Notable for the following:    Prothrombin Time >90.0 (*)    INR >10.00 (*)    All other components within normal limits  URINE CULTURE  CBC WITH DIFFERENTIAL/PLATELET  PREPARE FRESH FROZEN PLASMA  ____________________________________________  FINAL ASSESSMENT AND PLAN  Hematuria, supratherapeutic INR  Plan: Patient's labs were dictated above. Patient had presented for frank hematuria was found to have a markedly elevated pro-time/INR. Due to his degree of hematuria I will order vitamin K and FFP. I will discuss with the hospitalist for observation.   Emily Filbert, MD   Note: This note was generated in part or whole with voice recognition software. Voice recognition is usually quite accurate but there are transcription errors that can and very often do occur. I apologize for any typographical errors that were not detected and corrected.     Emily Filbert,  MD 11/18/16 2211

## 2016-11-19 DIAGNOSIS — Z79899 Other long term (current) drug therapy: Secondary | ICD-10-CM | POA: Diagnosis not present

## 2016-11-19 DIAGNOSIS — E785 Hyperlipidemia, unspecified: Secondary | ICD-10-CM | POA: Diagnosis present

## 2016-11-19 DIAGNOSIS — F250 Schizoaffective disorder, bipolar type: Secondary | ICD-10-CM | POA: Diagnosis not present

## 2016-11-19 DIAGNOSIS — Z7901 Long term (current) use of anticoagulants: Secondary | ICD-10-CM | POA: Diagnosis not present

## 2016-11-19 DIAGNOSIS — R791 Abnormal coagulation profile: Secondary | ICD-10-CM

## 2016-11-19 DIAGNOSIS — R310 Gross hematuria: Secondary | ICD-10-CM | POA: Diagnosis present

## 2016-11-19 DIAGNOSIS — Z880 Allergy status to penicillin: Secondary | ICD-10-CM | POA: Diagnosis not present

## 2016-11-19 DIAGNOSIS — F17210 Nicotine dependence, cigarettes, uncomplicated: Secondary | ICD-10-CM | POA: Diagnosis present

## 2016-11-19 DIAGNOSIS — E876 Hypokalemia: Secondary | ICD-10-CM | POA: Diagnosis present

## 2016-11-19 DIAGNOSIS — E119 Type 2 diabetes mellitus without complications: Secondary | ICD-10-CM | POA: Diagnosis not present

## 2016-11-19 DIAGNOSIS — F209 Schizophrenia, unspecified: Secondary | ICD-10-CM | POA: Diagnosis not present

## 2016-11-19 DIAGNOSIS — Z794 Long term (current) use of insulin: Secondary | ICD-10-CM | POA: Diagnosis not present

## 2016-11-19 DIAGNOSIS — I10 Essential (primary) hypertension: Secondary | ICD-10-CM | POA: Diagnosis present

## 2016-11-19 DIAGNOSIS — Z86711 Personal history of pulmonary embolism: Secondary | ICD-10-CM | POA: Diagnosis not present

## 2016-11-19 DIAGNOSIS — R45851 Suicidal ideations: Secondary | ICD-10-CM | POA: Diagnosis present

## 2016-11-19 DIAGNOSIS — Z8249 Family history of ischemic heart disease and other diseases of the circulatory system: Secondary | ICD-10-CM | POA: Diagnosis not present

## 2016-11-19 DIAGNOSIS — Z915 Personal history of self-harm: Secondary | ICD-10-CM | POA: Diagnosis not present

## 2016-11-19 HISTORY — DX: Abnormal coagulation profile: R79.1

## 2016-11-19 LAB — PHOSPHORUS: Phosphorus: 3.2 mg/dL (ref 2.5–4.6)

## 2016-11-19 LAB — GLUCOSE, CAPILLARY
GLUCOSE-CAPILLARY: 108 mg/dL — AB (ref 65–99)
Glucose-Capillary: 97 mg/dL (ref 65–99)
Glucose-Capillary: 112 mg/dL — ABNORMAL HIGH (ref 65–99)
Glucose-Capillary: 87 mg/dL (ref 65–99)

## 2016-11-19 LAB — PREPARE FRESH FROZEN PLASMA: UNIT DIVISION: 0

## 2016-11-19 LAB — BASIC METABOLIC PANEL
Anion gap: 6 (ref 5–15)
BUN: 15 mg/dL (ref 6–20)
CALCIUM: 8.9 mg/dL (ref 8.9–10.3)
CO2: 25 mmol/L (ref 22–32)
CREATININE: 0.92 mg/dL (ref 0.61–1.24)
Chloride: 112 mmol/L — ABNORMAL HIGH (ref 101–111)
GFR calc Af Amer: >60 mL/min (ref >60)
GFR calc non Af Amer: >60 mL/min (ref >60)
Glucose, Bld: 100 mg/dL — ABNORMAL HIGH (ref 65–99)
Potassium: 3.4 mmol/L — ABNORMAL LOW (ref 3.5–5.1)
SODIUM: 143 mmol/L (ref 135–145)

## 2016-11-19 LAB — BPAM FFP
BLOOD PRODUCT EXPIRATION DATE: 201806112359
ISSUE DATE / TIME: 201806062317
Unit Type and Rh: 6200

## 2016-11-19 LAB — CBC
HCT: 39.5 % — ABNORMAL LOW (ref 40.0–52.0)
Hemoglobin: 14.1 g/dL (ref 13.0–18.0)
MCH: 30.9 pg (ref 26.0–34.0)
MCHC: 35.6 g/dL (ref 32.0–36.0)
MCV: 86.9 fL (ref 80.0–100.0)
PLATELETS: 174 10*3/uL (ref 150–440)
RBC: 4.54 MIL/uL (ref 4.40–5.90)
RDW: 12.6 % (ref 11.5–14.5)
WBC: 5.6 10*3/uL (ref 3.8–10.6)

## 2016-11-19 LAB — PROTIME-INR
INR: 3.01
PROTHROMBIN TIME: 31.9 s — AB (ref 11.4–15.2)

## 2016-11-19 LAB — APTT: aPTT: 50 seconds — ABNORMAL HIGH (ref 24–36)

## 2016-11-19 LAB — MAGNESIUM: MAGNESIUM: 1.9 mg/dL (ref 1.7–2.4)

## 2016-11-19 MED ORDER — ALBUTEROL SULFATE (2.5 MG/3ML) 0.083% IN NEBU: 2.5000 mg | INHALATION_SOLUTION | Freq: Four times a day (QID) | RESPIRATORY_TRACT | Status: DC | PRN

## 2016-11-19 MED ORDER — ACETAMINOPHEN 650 MG RE SUPP: 650.0000 mg | Freq: Four times a day (QID) | RECTAL | Status: DC | PRN

## 2016-11-19 MED ORDER — POLYETHYLENE GLYCOL 3350 17 G PO PACK
17.0000 g | PACK | Freq: Every day | ORAL | Status: DC
Start: 2016-11-19 — End: 2016-11-20
  Administered 2016-11-19: 17 g via ORAL
  Filled 2016-11-19 (×2): qty 1

## 2016-11-19 MED ORDER — MOMETASONE FURO-FORMOTEROL FUM 200-5 MCG/ACT IN AERO
2.0000 | INHALATION_SPRAY | Freq: Two times a day (BID) | RESPIRATORY_TRACT | Status: DC
  Administered 2016-11-19 – 2016-11-20 (×3): 2 via RESPIRATORY_TRACT
  Filled 2016-11-19: qty 8.8

## 2016-11-19 MED ORDER — SENNOSIDES-DOCUSATE SODIUM 8.6-50 MG PO TABS: 1.0000 | ORAL_TABLET | Freq: Every evening | ORAL | Status: DC | PRN

## 2016-11-19 MED ORDER — ONDANSETRON HCL 4 MG PO TABS: 4.0000 mg | ORAL_TABLET | Freq: Four times a day (QID) | ORAL | Status: DC | PRN

## 2016-11-19 MED ORDER — POTASSIUM CHLORIDE CRYS ER 20 MEQ PO TBCR
40.0000 meq | EXTENDED_RELEASE_TABLET | Freq: Two times a day (BID) | ORAL | Status: AC
  Administered 2016-11-19 (×2): 40 meq via ORAL
  Filled 2016-11-19 (×2): qty 2

## 2016-11-19 MED ORDER — ACETAMINOPHEN 325 MG PO TABS: 650.0000 mg | ORAL_TABLET | Freq: Four times a day (QID) | ORAL | Status: DC | PRN

## 2016-11-19 MED ORDER — MAGNESIUM CITRATE PO SOLN
1.0000 | Freq: Once | ORAL | Status: DC | PRN
  Filled 2016-11-19: qty 296

## 2016-11-19 MED ORDER — ONDANSETRON HCL 4 MG/2ML IJ SOLN: 4.0000 mg | Freq: Four times a day (QID) | INTRAMUSCULAR | Status: DC | PRN

## 2016-11-19 MED ORDER — ZOLPIDEM TARTRATE 5 MG PO TABS
5.0000 mg | ORAL_TABLET | Freq: Every evening | ORAL | Status: DC | PRN
  Administered 2016-11-19: 5 mg via ORAL
  Filled 2016-11-19: qty 1

## 2016-11-19 MED ORDER — BUPROPION HCL ER (XL) 150 MG PO TB24
150.0000 mg | ORAL_TABLET | Freq: Every day | ORAL | Status: DC
  Administered 2016-11-19 – 2016-11-20 (×2): 150 mg via ORAL
  Filled 2016-11-19 (×2): qty 1

## 2016-11-19 MED ORDER — NICOTINE 10 MG IN INHA: 1.0000 | RESPIRATORY_TRACT | Status: DC | PRN

## 2016-11-19 MED ORDER — SODIUM CHLORIDE 0.9 % IV SOLN
INTRAVENOUS | Status: DC
Start: 2016-11-19 — End: 2016-11-20
  Administered 2016-11-19 (×2): via INTRAVENOUS

## 2016-11-19 MED ORDER — SODIUM CHLORIDE 0.9 % IV SOLN: Freq: Once | INTRAVENOUS | Status: DC

## 2016-11-19 MED ORDER — DOCUSATE SODIUM 100 MG PO CAPS
200.0000 mg | ORAL_CAPSULE | Freq: Two times a day (BID) | ORAL | Status: DC
  Administered 2016-11-19 – 2016-11-20 (×3): 200 mg via ORAL
  Filled 2016-11-19 (×3): qty 2

## 2016-11-19 MED ORDER — IPRATROPIUM BROMIDE 0.02 % IN SOLN: 0.5000 mg | Freq: Four times a day (QID) | RESPIRATORY_TRACT | Status: DC | PRN

## 2016-11-19 MED ORDER — BISACODYL 5 MG PO TBEC: 5.0000 mg | DELAYED_RELEASE_TABLET | Freq: Every day | ORAL | Status: DC | PRN

## 2016-11-19 MED ORDER — CLOZAPINE 25 MG PO TABS
450.0000 mg | ORAL_TABLET | Freq: Every day | ORAL | Status: DC
  Administered 2016-11-19: 450 mg via ORAL
  Filled 2016-11-19 (×2): qty 2

## 2016-11-19 MED ORDER — NICOTINE 21 MG/24HR TD PT24
21.0000 mg | MEDICATED_PATCH | Freq: Every day | TRANSDERMAL | Status: DC
  Administered 2016-11-19 – 2016-11-20 (×2): 21 mg via TRANSDERMAL
  Filled 2016-11-19 (×2): qty 1

## 2016-11-19 MED ORDER — INSULIN ASPART 100 UNIT/ML ~~LOC~~ SOLN: 0.0000 [IU] | Freq: Three times a day (TID) | SUBCUTANEOUS | Status: DC

## 2016-11-19 MED ORDER — IPRATROPIUM BROMIDE 0.06 % NA SOLN
1.0000 | Freq: Every day | NASAL | Status: DC
  Administered 2016-11-19: 1 via NASAL
  Filled 2016-11-19: qty 15

## 2016-11-19 MED ORDER — CLONAZEPAM 0.5 MG PO TABS
0.5000 mg | ORAL_TABLET | Freq: Every day | ORAL | Status: DC
  Administered 2016-11-19: 0.5 mg via ORAL
  Filled 2016-11-19: qty 1

## 2016-11-19 MED ORDER — INSULIN ASPART 100 UNIT/ML ~~LOC~~ SOLN: 0.0000 [IU] | Freq: Every day | SUBCUTANEOUS | Status: DC

## 2016-11-19 MED ORDER — SIMVASTATIN 10 MG PO TABS: 40.0000 mg | ORAL_TABLET | Freq: Every day | ORAL | Status: DC

## 2016-11-19 MED ORDER — LITHIUM CARBONATE ER 450 MG PO TBCR
450.0000 mg | EXTENDED_RELEASE_TABLET | Freq: Two times a day (BID) | ORAL | Status: DC
  Administered 2016-11-19 – 2016-11-20 (×3): 450 mg via ORAL
  Filled 2016-11-19 (×5): qty 1

## 2016-11-19 NOTE — Progress Notes (Signed)
[facility] [place] [facility]   PATIENT NAME: Lance [name]    MR#:  [id number]  DATE OF BIRTH:  Jan 15, 1976  SUBJECTIVE:  CHIEF COMPLAINT:   Chief Complaint  Patient presents with  . 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 His group home does not check his INR on time and he keep taking his Coumadin without that. Since yesterday he noted blood in his urine and so came to emergency room. Noted to have INR more than 10. REVIEW OF SYSTEMS:  CONSTITUTIONAL: No fever, fatigue or weakness.  EYES: No blurred or double vision.  EARS, NOSE, AND THROAT: No tinnitus or ear pain.  RESPIRATORY: No cough, shortness of breath, wheezing or hemoptysis.  CARDIOVASCULAR: No chest pain, orthopnea, edema.  GASTROINTESTINAL: No nausea, vomiting, diarrhea or abdominal pain.  GENITOURINARY: No dysuria, hematuria.  ENDOCRINE: No polyuria, nocturia,  HEMATOLOGY: No anemia, easy bruising or bleeding SKIN: No rash or lesion. MUSCULOSKELETAL: No joint pain or arthritis.   NEUROLOGIC: No tingling, numbness, weakness.  PSYCHIATRY: No anxiety or depression.   ROS  DRUG ALLERGIES:   Allergies  Allergen Reactions  . Penicillins Other (See Comments)    Reaction: "lockjaw" Has patient had a PCN reaction causing immediate rash, facial/tongue/throat swelling, SOB or lightheadedness with hypotension: No Has patient had a PCN reaction causing severe rash involving mucus membranes or skin necrosis: No Has patient had a PCN reaction that required hospitalization: No Has patient had a PCN reaction occurring within the last 10 years: No If all of the above answers are "NO", then may proceed with Cephalosporin use.     VITALS:  Blood pressure 133/80, pulse 60, temperature 97.8 F (36.6 C), temperature source Oral, resp. rate 20, height 5' 10.5" (1.791 m), weight 89.1 kg (196 lb 8 oz), SpO2 100 %.  PHYSICAL EXAMINATION:  GENERAL:  41  y.o.-year-old patient lying in the bed with no acute distress.  EYES: Pupils equal, round, reactive to light and accommodation. No scleral icterus. Extraocular muscles intact.  HEENT: Head atraumatic, normocephalic. Oropharynx and nasopharynx clear.  NECK:  Supple, no jugular venous distention. No thyroid enlargement, no tenderness.  LUNGS: Normal breath sounds bilaterally, no wheezing, rales,rhonchi or crepitation. No use of accessory muscles of respiration.  CARDIOVASCULAR: S1, S2 normal. No murmurs, rubs, or gallops.  ABDOMEN: Soft, nontender, nondistended. Bowel sounds present. No organomegaly or mass.  EXTREMITIES: No pedal edema, cyanosis, or clubbing.  NEUROLOGIC: Cranial nerves II through XII are intact. Muscle strength 5/5 in all extremities. Sensation intact. Gait not checked.  PSYCHIATRIC: The patient is alert and oriented x 3. Appears anxious. SKIN: No obvious rash, lesion, or ulcer.   Physical Exam LABORATORY PANEL:   CBC  Recent Labs Lab 11/19/16 0244  WBC 5.6  HGB 14.1  HCT 39.5*  PLT 174   ------------------------------------------------------------------------------------------------------------------  Chemistries   Recent Labs Lab 11/18/16 2102 11/19/16 0244  NA 144 143  K 3.3* 3.4*  CL 112* 112*  CO2 25 25  GLUCOSE 103* 100*  BUN 16 15  CREATININE 1.04 0.92  CALCIUM 9.4 8.9  MG 1.9  --   AST 31  --   ALT 31  --   ALKPHOS 57  --   BILITOT 0.5  --    ------------------------------------------------------------------------------------------------------------------  Cardiac Enzymes No results for input(s): TROPONINI in the last 168 hours. ------------------------------------------------------------------------------------------------------------------  RADIOLOGY:  No results found.  ASSESSMENT AND PLAN:   Active Problems:  Supratherapeutic INR   This is a 41 y.o. male with a history of  DM, HTN, HLD, PE, schizoaffective disorder now being  admitted with:  #. Supratherapeutic INR - Reverse INR with FFP and Vitamin K- now 3 - Pharmacy consult  - CBC q4hours and repeat PT/INR  #. Gross hematuria secondary to above - Monitor as INR improves  #. Hypokalemia - Replace PO - Recheck BMP in AM- improved.  #. History of Diabetes - Accuchecks with RISS coverage - Heart healthy, carb controlled diet  #. History of schizoaffective, depression - Continue Wellbutrin, Klonopin, Clozaril, Lithium  #. History of HLD - Continue Zocor  # Suicidal tendency   He told nurse today.   Safty sitter and psych consult.  # Lupus antibody   History of pulmonary embolism   Advised to have lifelong anti-coagulation by Dr. Sherrlyn Hock in Dec 2013.   He is not able to follow his INR at group home as they do not send him to the clinic for checkup, we may need to arrange for visiting nurse and home health on discharge.  All the records are reviewed and case discussed with Care Management/Social Workerr. Management plans discussed with the patient, family and they are in agreement.  CODE STATUS: Full.  TOTAL TIME TAKING CARE OF THIS PATIENT: 35 minutes.    POSSIBLE D/C IN 1-2 DAYS, DEPENDING ON CLINICAL CONDITION.   Altamese Dilling M.D on 11/19/2016   Between 7am to 6pm - Pager - [phone]  After 6pm go to [email or web address] - password [email or web address]  [facility]  Office  [phone]  CC: Primary care physician; Corky Downs, MD  Note: This dictation was prepared with Dragon dictation along with smaller phrase technology. Any transcriptional errors that result from this process are unintentional.

## 2016-11-19 NOTE — Consult Note (Signed)
[facility] Consult   Reason for Consult:  Consult for 41 year old man with schizophrenia who is currently in the hospital for complaints of hematuria Referring Physician:  Marthann Schiller Patient Identification: Lance [name] MRN:  [id number] Principal Diagnosis: Schizoaffective disorder, bipolar type [facility]) Diagnosis:   Patient Active Problem List   Diagnosis Date Noted  . Supratherapeutic INR [R79.1] 11/19/2016  . Tobacco use disorder [F17.200] 09/01/2016  . Schizoaffective disorder, bipolar type ([place]) [F25.0] 05/22/2016  . Diabetes mellitus without complication ([place]) [V03.5] 05/21/2016  . Hyperlipidemia [E78.5] 05/21/2016  . History of pulmonary embolism [Z86.711] 05/21/2016  . Chronic anticoagulation [Z79.01] 05/21/2016  . Hypertension [I10] 09/25/2015    Total Time spent with patient: 1 hour  Subjective:   Lance [name] is a 41 y.o. male patient admitted with "[name]".  HPI:  Patient interviewed chart reviewed. Also spoke with social work. Patient also well known from previous encounters. This is a 41 year old man with chronic mental health issues who is currently on the medical service because of complaints of visible hematuria. On admission workup he was found to have an INR greater than 10 suggesting inappropriate dosing and monitoring of his Coumadin therapy. Most likely cause of his bleeding. Patient made statements to staff on the ward about being suicidal prompting the consult. On interview today the patient says he has been feeling "suicidal" for about 3 or 4 days. He was walking around outside the other day and found himself having the thought of throwing himself in front of a truck. He talks at some length about how limited and empty his life is. He feels down most of the time. Doesn't really seem to feel like he has much positive going on in his life. Doesn't report any particular change to his sleep however or his appetite. He denies that he is having  auditory hallucinations currently and in the conversation did not appear to be paranoid or obviously psychotic. Air in the medical service he has already pulled out an IV one time for no good reason. He is being maintained on his usual psychiatric medicines.  Social history: Patient doesn't really have much contact with any family. He has a guardian from one of the social services. He lives in a group home where he has been for quite a while.  Medical history: He has a history of insulin-dependent diabetes and also a history of repeated pulmonary embolisms resulting in chronic anticoagulation therapy.  Substance abuse history: Distant history of abuse of marijuana. Doesn't use drugs or alcohol regularly anymore. Past Psychiatric History: Patient has a long history of schizophrenia or schizoaffective disorder. Multiple hospitalizations. When he is off medications for at least in the past I have seen him be extremely psychotic and paranoid. Since being on clozapine he has been doing much better in terms of his mental health issues. He does have a history of aggression and of suicide attempts in the past.  Risk to Self: Is patient at risk for suicide?: No Risk to Others:   Prior Inpatient Therapy:   Prior Outpatient Therapy:    Past Medical History:  Past Medical History:  Diagnosis Date  . Depression   . Diabetes mellitus without complication ([place])   . Hyperlipidemia   . Hypertension   . Lupus anticoagulant disorder ([place]) 06/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  . PE (pulmonary thromboembolism) ([place])   . Schizo affective schizophrenia ([place])    History reviewed. No pertinent surgical history. Family History:  Family History  Problem Relation Age of Onset  . CAD Mother   . CAD Sister    Family Psychiatric  History: No known family history of serious mental illness Social History:  History  Alcohol Use No    Comment: occassionally     History  Drug Use  . Types: Marijuana     Social History   Social History  . Marital status: Single    Spouse name: N/A  . Number of children: N/A  . Years of education: N/A   Social History Main Topics  . Smoking status: Current Every Day Smoker    Packs/day: 1.00    Years: 23.00    Types: Cigarettes  . Smokeless tobacco: Never Used  . Alcohol use No     Comment: occassionally  . Drug use: Yes    Types: Marijuana  . Sexual activity: No     Comment: occasional marijuana- none recently   Other Topics Concern  . None   Social History Narrative   From a group home in [place]   Additional Social History:    Allergies:   Allergies  Allergen Reactions  . Penicillins Other (See Comments)    Reaction: "lockjaw" Has patient had a PCN reaction causing immediate rash, facial/tongue/throat swelling, SOB or lightheadedness with hypotension: No Has patient had a PCN reaction causing severe rash involving mucus membranes or skin necrosis: No Has patient had a PCN reaction that required hospitalization: No Has patient had a PCN reaction occurring within the last 10 years: No If all of the above answers are "NO", then may proceed with Cephalosporin use.     Labs:  Results for orders placed or performed during the hospital encounter of 11/18/16 (from the past 48 hour(s))  Urinalysis, Complete w Microscopic     Status: Abnormal   Collection Time: 11/18/16  8:30 PM  Result Value Ref Range   Color, Urine RED (A) YELLOW   APPearance CLOUDY (A) CLEAR   Specific Gravity, Urine 1.020 1.005 - 1.030   pH  5.0 - 8.0    TEST NOT REPORTED DUE TO COLOR INTERFERENCE OF URINE PIGMENT   Glucose, UA (A) NEGATIVE mg/dL    TEST NOT REPORTED DUE TO COLOR INTERFERENCE OF URINE PIGMENT   Hgb urine dipstick (A) NEGATIVE    TEST NOT REPORTED DUE TO COLOR INTERFERENCE OF URINE PIGMENT   Bilirubin Urine (A) NEGATIVE    TEST NOT REPORTED DUE TO COLOR INTERFERENCE OF URINE PIGMENT   Ketones, ur (A) NEGATIVE mg/dL    TEST NOT REPORTED  DUE TO COLOR INTERFERENCE OF URINE PIGMENT   Protein, ur (A) NEGATIVE mg/dL    TEST NOT REPORTED DUE TO COLOR INTERFERENCE OF URINE PIGMENT   Nitrite (A) NEGATIVE    TEST NOT REPORTED DUE TO COLOR INTERFERENCE OF URINE PIGMENT   Leukocytes, UA (A) NEGATIVE    TEST NOT REPORTED DUE TO COLOR INTERFERENCE OF URINE PIGMENT   RBC / HPF TOO NUMEROUS TO COUNT 0 - 5 RBC/hpf   WBC, UA TOO NUMEROUS TO COUNT 0 - 5 WBC/hpf   Bacteria, UA NONE SEEN NONE SEEN   Squamous Epithelial / LPF NONE SEEN NONE SEEN  CBC with Differential/Platelet     Status: None   Collection Time: 11/18/16  9:02 PM  Result Value Ref Range   WBC 6.3 3.8 - 10.6 K/uL   RBC 4.86 4.40 - 5.90 MIL/uL   Hemoglobin 15.0 13.0 - 18.0 g/dL   HCT 42.9 40.0 - 52.0 %  MCV 88.2 80.0 - 100.0 fL   MCH 31.0 26.0 - 34.0 pg   MCHC 35.1 32.0 - 36.0 g/dL   RDW 12.9 11.5 - 14.5 %   Platelets 198 150 - 440 K/uL   Neutrophils Relative % 63 %   Neutro Abs 3.9 1.4 - 6.5 K/uL   Lymphocytes Relative 26 %   Lymphs Abs 1.6 1.0 - 3.6 K/uL   Monocytes Relative 11 %   Monocytes Absolute 0.7 0.2 - 1.0 K/uL   Eosinophils Relative 0 %   Eosinophils Absolute 0.0 0 - 0.7 K/uL   Basophils Relative 0 %   Basophils Absolute 0.0 0 - 0.1 K/uL  Comprehensive metabolic panel     Status: Abnormal   Collection Time: 11/18/16  9:02 PM  Result Value Ref Range   Sodium 144 135 - 145 mmol/L   Potassium 3.3 (L) 3.5 - 5.1 mmol/L   Chloride 112 (H) 101 - 111 mmol/L   CO2 25 22 - 32 mmol/L   Glucose, Bld 103 (H) 65 - 99 mg/dL   BUN 16 6 - 20 mg/dL   Creatinine, Ser 1.04 0.61 - 1.24 mg/dL   Calcium 9.4 8.9 - 10.3 mg/dL   Total Protein 6.7 6.5 - 8.1 g/dL   Albumin 4.2 3.5 - 5.0 g/dL   AST 31 15 - 41 U/L   ALT 31 17 - 63 U/L   Alkaline Phosphatase 57 38 - 126 U/L   Total Bilirubin 0.5 0.3 - 1.2 mg/dL   GFR calc non Af Amer >60 >60 mL/min   GFR calc Af Amer >60 >60 mL/min    Comment: (NOTE) The eGFR has been calculated using the CKD EPI equation. This  calculation has not been validated in all clinical situations. eGFR's persistently <60 mL/min signify possible Chronic Kidney Disease.    Anion gap 7 5 - 15  Protime-INR     Status: Abnormal   Collection Time: 11/18/16  9:02 PM  Result Value Ref Range   Prothrombin Time >90.0 (H) 11.4 - 15.2 seconds   INR >10.00 (HH)     Comment: CRITICAL RESULT CALLED TO, READ BACK BY AND VERIFIED WITH: KENDALL MOFFITT 11/18/16 @ 2208  MLK   ABO/Rh     Status: None   Collection Time: 11/18/16  9:02 PM  Result Value Ref Range   ABO/RH(D) A POS   Magnesium     Status: None   Collection Time: 11/18/16  9:02 PM  Result Value Ref Range   Magnesium 1.9 1.7 - 2.4 mg/dL  Phosphorus     Status: None   Collection Time: 11/18/16  9:02 PM  Result Value Ref Range   Phosphorus 3.2 2.5 - 4.6 mg/dL  Prepare fresh frozen plasma     Status: None   Collection Time: 11/18/16 10:18 PM  Result Value Ref Range   Unit Number [id number]    Blood Component Type THWPLS APHR1    Unit division 00    Status of Unit ISSUED,FINAL    Transfusion Status OK TO TRANSFUSE   Prepare fresh frozen plasma     Status: None   Collection Time: 11/19/16  2:30 AM  Result Value Ref Range   Unit Number [id number]    Blood Component Type THWPLS APHR2    Unit division 00    Status of Unit REL FROM [facility]    Transfusion Status OK TO TRANSFUSE   Basic metabolic panel     Status: Abnormal   Collection Time: 11/19/16  2:44  AM  Result Value Ref Range   Sodium 143 135 - 145 mmol/L   Potassium 3.4 (L) 3.5 - 5.1 mmol/L   Chloride 112 (H) 101 - 111 mmol/L   CO2 25 22 - 32 mmol/L   Glucose, Bld 100 (H) 65 - 99 mg/dL   BUN 15 6 - 20 mg/dL   Creatinine, Ser 0.92 0.61 - 1.24 mg/dL   Calcium 8.9 8.9 - 10.3 mg/dL   GFR calc non Af Amer >60 >60 mL/min   GFR calc Af Amer >60 >60 mL/min    Comment: (NOTE) The eGFR has been calculated using the CKD EPI equation. This calculation has not been validated in all clinical situations. eGFR's  persistently <60 mL/min signify possible Chronic Kidney Disease.    Anion gap 6 5 - 15  CBC     Status: Abnormal   Collection Time: 11/19/16  2:44 AM  Result Value Ref Range   WBC 5.6 3.8 - 10.6 K/uL   RBC 4.54 4.40 - 5.90 MIL/uL   Hemoglobin 14.1 13.0 - 18.0 g/dL   HCT 39.5 (L) 40.0 - 52.0 %   MCV 86.9 80.0 - 100.0 fL   MCH 30.9 26.0 - 34.0 pg   MCHC 35.6 32.0 - 36.0 g/dL   RDW 12.6 11.5 - 14.5 %   Platelets 174 150 - 440 K/uL  Protime-INR     Status: Abnormal   Collection Time: 11/19/16  2:44 AM  Result Value Ref Range   Prothrombin Time 31.9 (H) 11.4 - 15.2 seconds   INR 3.01   APTT     Status: Abnormal   Collection Time: 11/19/16  2:44 AM  Result Value Ref Range   aPTT 50 (H) 24 - 36 seconds    Comment:        IF BASELINE aPTT IS ELEVATED, SUGGEST PATIENT RISK ASSESSMENT BE USED TO DETERMINE APPROPRIATE ANTICOAGULANT THERAPY.   Glucose, capillary     Status: None   Collection Time: 11/19/16  7:35 AM  Result Value Ref Range   Glucose-Capillary 97 65 - 99 mg/dL  Glucose, capillary     Status: Abnormal   Collection Time: 11/19/16 11:35 AM  Result Value Ref Range   Glucose-Capillary 108 (H) 65 - 99 mg/dL    Current Facility-Administered Medications  Medication Dose Route Frequency Provider Last Rate Last Dose  . 0.9 %  sodium chloride infusion   Intravenous Continuous Hugelmeyer, Alexis, DO 75 mL/hr at 11/19/16 0251    . 0.9 %  sodium chloride infusion   Intravenous Once Hugelmeyer, Alexis, DO      . acetaminophen (TYLENOL) tablet 650 mg  650 mg Oral Q6H PRN Hugelmeyer, Alexis, DO       Or  . acetaminophen (TYLENOL) suppository 650 mg  650 mg Rectal Q6H PRN Hugelmeyer, Alexis, DO      . albuterol (PROVENTIL) (2.5 MG/3ML) 0.083% nebulizer solution 2.5 mg  2.5 mg Nebulization Q6H PRN Hugelmeyer, Alexis, DO      . bisacodyl (DULCOLAX) EC tablet 5 mg  5 mg Oral Daily PRN Hugelmeyer, Alexis, DO      . buPROPion (WELLBUTRIN XL) 24 hr tablet 150 mg  150 mg Oral Daily  Hugelmeyer, Alexis, DO   150 mg at 11/19/16 0825  . clonazePAM (KLONOPIN) tablet 0.5 mg  0.5 mg Oral QHS Hugelmeyer, Alexis, DO      . cloZAPine (CLOZARIL) tablet 450 mg  450 mg Oral QHS Hugelmeyer, Alexis, DO      . docusate sodium (COLACE)  capsule 200 mg  200 mg Oral BID Hugelmeyer, Alexis, DO   200 mg at 11/19/16 0824  . insulin aspart (novoLOG) injection 0-15 Units  0-15 Units Subcutaneous TID WC Hugelmeyer, Alexis, DO      . insulin aspart (novoLOG) injection 0-5 Units  0-5 Units Subcutaneous QHS Hugelmeyer, Alexis, DO      . ipratropium (ATROVENT) 0.06 % nasal spray 1 spray  1 spray Each Nare QHS Hugelmeyer, Alexis, DO      . ipratropium (ATROVENT) nebulizer solution 0.5 mg  0.5 mg Nebulization Q6H PRN Hugelmeyer, Alexis, DO      . lithium carbonate (ESKALITH) CR tablet 450 mg  450 mg Oral Q12H Hugelmeyer, Alexis, DO   450 mg at 11/19/16 0824  . magnesium citrate solution 1 Bottle  1 Bottle Oral Once PRN Hugelmeyer, Alexis, DO      . mometasone-formoterol (DULERA) 200-5 MCG/ACT inhaler 2 puff  2 puff Inhalation BID Hugelmeyer, Alexis, DO   2 puff at 11/19/16 0825  . nicotine (NICODERM CQ - dosed in mg/24 hours) patch 21 mg  21 mg Transdermal Daily Pyreddy, Reatha Harps, MD   21 mg at 11/19/16 0825  . ondansetron (ZOFRAN) tablet 4 mg  4 mg Oral Q6H PRN Hugelmeyer, Alexis, DO       Or  . ondansetron (ZOFRAN) injection 4 mg  4 mg Intravenous Q6H PRN Hugelmeyer, Alexis, DO      . polyethylene glycol (MIRALAX / GLYCOLAX) packet 17 g  17 g Oral Daily Hugelmeyer, Alexis, DO   17 g at 11/19/16 0825  . senna-docusate (Senokot-S) tablet 1 tablet  1 tablet Oral QHS PRN Hugelmeyer, Alexis, DO        Musculoskeletal: Strength & Muscle Tone: within normal limits Gait & Station: normal Patient leans: N/A  Psychiatric Specialty Exam: Physical Exam  Nursing note and vitals reviewed. Constitutional: He appears well-developed and well-nourished.  HENT:  Head: Normocephalic and atraumatic.  Eyes:  Conjunctivae are normal. Pupils are equal, round, and reactive to light.  Neck: Normal range of motion.  Cardiovascular: Regular rhythm and normal heart sounds.   Respiratory: Effort normal. No respiratory distress.  GI: Soft.  Musculoskeletal: Normal range of motion.  Neurological: He is alert.  Skin: Skin is warm and dry.  Psychiatric: His speech is normal and behavior is normal. His affect is blunt. Cognition and memory are impaired. He expresses impulsivity. He expresses suicidal ideation. He expresses no suicidal plans.    Review of Systems  Constitutional: Negative.   HENT: Negative.   Eyes: Negative.   Respiratory: Negative.   Cardiovascular: Negative.   Gastrointestinal: Negative.   Genitourinary: Positive for hematuria.  Musculoskeletal: Negative.   Skin: Negative.   Neurological: Negative.   Psychiatric/Behavioral: Positive for depression and suicidal ideas. Negative for hallucinations, memory loss and substance abuse. The patient is nervous/anxious. The patient does not have insomnia.     Blood pressure 133/80, pulse 60, temperature 97.8 F (36.6 C), temperature source Oral, resp. rate 20, height 5' 10.5" (1.791 m), weight 89.1 kg (196 lb 8 oz), SpO2 100 %.Body mass index is 27.8 kg/m.  General Appearance: Casual  Eye Contact:  Good  Speech:  Normal Rate  Volume:  Normal  Mood:  Dysphoric  Affect:  Congruent  Thought Process:  Goal Directed  Orientation:  Full (Time, Place, and Person)  Thought Content:  Rumination  Suicidal Thoughts:  Yes.  without intent/plan  Homicidal Thoughts:  No  Memory:  Immediate;   Good Recent;   Fair  Remote;   Fair  Judgement:  Impaired  Insight:  Fair  Psychomotor Activity:  Decreased  Concentration:  Concentration: Fair  Recall:  [employer] of Knowledge:  Fair  Language:  Fair  Akathisia:  No  Handed:  Right  AIMS (if indicated):     Assets:  Communication Skills Desire for Improvement Financial  Resources/Insurance Housing Resilience  ADL's:  Intact  Cognition:  Impaired,  Mild  Sleep:        Treatment Plan Summary: Daily contact with patient to assess and evaluate symptoms and progress in treatment, Medication management and Plan Consult for 41 year old man with schizoaffective disorder. After being admitted for medical treatment he told staff that he was having suicidal ideation. I'm familiar with this patient from multiple previous encounters. He told me also that he was having suicidal ideation. His overall mental state seems to be at its baseline but at the same time I do not think that he is fabricating this suicidal ideation. He probably has feelings and thoughts like that pretty chronically. I did not press him too hard about it. He is clearly landing to be cooperative here on the medical service or at least try to be reasonably so. I think the risk of him trying to hurt himself on the medical service is low although I'm not going to discontinue the sitter just because you never know what impulsive thing he might do or if he might pull out another line. I'm not sure if he will require admission to the psychiatric unit. If so I imagine he will be voluntarily willing to go. I spoke with social work and said we will reassess it tomorrow and see how serious this suicidal ideation seems to be and whether admission seems like a good idea. No change to medicine for now.  Disposition: Supportive therapy provided about ongoing stressors. Discussed crisis plan, support from social network, calling 911, coming to the Emergency Department, and calling Suicide Hotline.  Alethia Berthold, MD 11/19/2016 3:49 PM

## 2016-11-19 NOTE — Progress Notes (Signed)
MD notified of repeat INR. ordered to hold next dose of FFP.

## 2016-11-19 NOTE — Progress Notes (Signed)
Patient expressed "having bad thoughts". Patient reports he had similar thoughts when he was 41 years old and he "took a hand full of pills and overdosed". Patient states he is tired of being ill and feels his life has no purpose. Patient does not have a plan to harm himself at this time. Nurse tech at bedside to sit with patient until sitter can be assigned. MD notified and psych consult ordered. Room prepared for suicide precautions.

## 2016-11-19 NOTE — Progress Notes (Signed)
Patient admitted for hematuria and found to have supratherapeutic INR > 10.0.  Patient is on simvastatin 20 mg daily at home and warfarin 7.5 mg daily. Will hold off on simvastatin and warfarin until INR can normalize to < 3.  MD notified and agrees with plan.  Thomasene Rippleavid Briceson Broadwater, PharmD, BCPS Clinical Pharmacist 11/19/2016

## 2016-11-19 NOTE — ED Notes (Signed)
Pt transported to room 160 

## 2016-11-19 NOTE — Clinical Social Work Note (Signed)
Clinical Social Work Assessment  Patient Details  Name: Lance [name] MRN: [id number] Date of Birth: 1975-12-08  Date of referral:  11/19/16               Reason for consult:  Other (Comment Required) (from [facility] in [place], [place])                Permission sought to share information with:  Chartered certified accountant granted to share information::  Yes, Verbal Permission Granted  Name::      [facility], [place], [place]  Agency::     Relationship::     Contact Information:     Housing/Transportation Living arrangements for the past 2 months:  [place] of Information:  Patient, Facility Patient Interpreter Needed:  None Criminal Activity/Legal Involvement Pertinent to Current Situation/Hospitalization:  No - Comment as needed Significant Relationships:  Other(Comment) Games developer) Lives with:  Facility Resident Do you feel safe going back to the place where you live?  Yes Need for family participation in patient care:  Yes (Comment)  Care giving concerns:  Patient is a resident at [facility] located at [address]. [place], [place] [address] (fax: [phone]).    Social Worker assessment / plan:  Holiday representative (CSW) reviewed chart and noted that patient is from [facility] group home in [place], [place]. [place] met with patient alone at bedside to discuss D/C plan. Patient was alert and oriented x4 and was laying in the bed. CSW introduced self and explained role of CSW department. Patient reported that he has lived at [facility] group home for 3 years now and has a guardian named Radio broadcast assistant in [place], [place]. Patient reported that he does not know how to contact [name] and doesn't have her phone number. Patient reported that he independent with all his ADL's and walks without an assistance device. Patient is agreeable to return to the group home once he is stable for D/C. Patient reported that he has blood in is urine and he would like  for that to be resolved before he discharges. CSW contacted [name] group home owner. Per [name] is patient's guardian however she doesn't have her phone number available right now. [name] reported that patient can return to the group home when stable and the group home will provide transport. CSW will continue to follow and assist as needed.     Employment status:  Disabled (Comment on whether or not currently receiving Disability) Insurance information:  Medicare, Medicaid In [place] PT Recommendations:  Not assessed at this time Information / Referral to community resources:  Other (Comment Required) (Patient will return to group home. )  Patient/Family's Response to care:  Patient is agreeable to return to the group home.   Patient/Family's Understanding of and Emotional Response to Diagnosis, Current Treatment, and Prognosis:  Patient was very pleasant and thanked CSW for visit.   Emotional Assessment Appearance:  Appears stated age Attitude/Demeanor/Rapport:    Affect (typically observed):  Accepting, Adaptable, Pleasant Orientation:  Oriented to Self, Oriented to Place, Oriented to Situation, Oriented to  Time Alcohol / Substance use:  Not Applicable Psych involvement (Current and /or in the community):  No (Comment)  Discharge Needs  Concerns to be addressed:  Discharge Planning Concerns Readmission within the last 30 days:  No Current discharge risk:  Chronically ill, Psychiatric Illness Barriers to Discharge:  Continued Medical Work up   [employer], Veronia Beets, LCSW 11/19/2016, 1:31 PM

## 2016-11-19 NOTE — Progress Notes (Signed)
MEDICATION RELATED CONSULT NOTE - INITIAL   Pharmacy Consult for Clozapine Monitoring Indication: Schizophrenia  Allergies  Allergen Reactions  . Penicillins Other (See Comments)    Reaction: "lockjaw" Has patient had a PCN reaction causing immediate rash, facial/tongue/throat swelling, SOB or lightheadedness with hypotension: No Has patient had a PCN reaction causing severe rash involving mucus membranes or skin necrosis: No Has patient had a PCN reaction that required hospitalization: No Has patient had a PCN reaction occurring within the last 10 years: No If all of the above answers are "NO", then may proceed with Cephalosporin use.     Patient Measurements: Height: 5' 10.5" (179.1 cm) Weight: 196 lb 8 oz (89.1 kg) IBW/kg (Calculated) : 74.15  Vital Signs: Temp: 97.6 F (36.4 C) (06/07 0234) Temp Source: Oral (06/07 0234) BP: 130/83 (06/07 0234) Pulse Rate: 58 (06/07 0234) Intake/Output from previous day: 06/06 0701 - 06/07 0700 In: 641.8 [I.V.:183.8; Blood:408; IV Piggyback:50] Out: 280 [Urine:280] Intake/Output from this shift: No intake/output data recorded.  Labs:  Recent Labs  11/18/16 2102 11/19/16 0244  WBC 6.3 5.6  HGB 15.0 14.1  HCT 42.9 39.5*  PLT 198 174  APTT  --  50*  CREATININE 1.04 0.92  MG 1.9  --   PHOS 3.2  --   ALBUMIN 4.2  --   PROT 6.7  --   AST 31  --   ALT 31  --   ALKPHOS 57  --   BILITOT 0.5  --    Estimated Creatinine Clearance: 118.7 mL/min (by C-G formula based on SCr of 0.92 mg/dL).   Microbiology: No results found for this or any previous visit (from the past 720 hour(s)).  Medical History: Past Medical History:  Diagnosis Date  . Depression   . Diabetes mellitus without complication (HCC)   . Hyperlipidemia   . Hypertension   . PE (pulmonary thromboembolism) (HCC)   . Schizo affective schizophrenia [facility](HCC)    Plan:  Patient from a Group Home.  Continue PTA order for Clozapine 450mg  at bedtime. Current ANC = 3900  11/18/16  Patient eligible for dispensing per [facility] site under: Dr. Huntley DecHernandez-Gonzalez  [id number] Zip [id number]  Next ANC due:  11/25/16 while inpatient  Birney Belshe K, RPh 11/19/2016,8:49 AM

## 2016-11-19 NOTE — Progress Notes (Signed)
Patient up to void at end of shift with amber colored urine. Patient with hematuria most of shift. Patient's mood improved with improvement of urine appearance. Psych evaluation completed with no new orders at this time.

## 2016-11-19 NOTE — [place] FL2 (Signed)
[place] MEDICAID FL2 LEVEL OF CARE SCREENING TOOL     IDENTIFICATION  Patient Name: Lance [name] Birthdate: 19-Jul-1975 Sex: male Admission Date (Current Location): 11/18/2016  [facility] and [place] Number:  [name]  ([id number] [facility]) Facility and Address:  [facility], [address] [place], [place] [address]      Provider Number: [id number]  Attending Physician Name and Address:  Altamese Dilling, *  Relative Name and Phone Number:       Current Level of Care: Hospital Recommended Level of Care: Other (Comment) (Group Home ) Prior Approval Number:    Date Approved/Denied:   PASRR Number:  ([id number])  Discharge Plan: Other (Comment) (Group Home )    Current Diagnoses: Patient Active Problem List   Diagnosis Date Noted  . Supratherapeutic INR 11/19/2016  . Tobacco use disorder 09/01/2016  . Schizoaffective disorder, bipolar type (HCC) 05/22/2016  . Diabetes mellitus without complication (HCC) 05/21/2016  . Hyperlipidemia 05/21/2016  . History of pulmonary embolism 05/21/2016  . Chronic anticoagulation 05/21/2016  . Hypertension 09/25/2015    Orientation RESPIRATION BLADDER Height & Weight     Self, Time, Situation, Place  Normal Continent Weight: 196 lb 8 oz (89.1 kg) Height:  5' 10.5" (179.1 cm)  BEHAVIORAL SYMPTOMS/MOOD NEUROLOGICAL BOWEL NUTRITION STATUS   (none)  (none) Continent Diet (Diet: Heart Healthy/ Carb Modified )  AMBULATORY STATUS COMMUNICATION OF NEEDS Skin   Independent Verbally Normal                       Personal Care Assistance Level of Assistance  Bathing, Feeding, Dressing Bathing Assistance: Independent Feeding assistance: Independent Dressing Assistance: Independent     Functional Limitations Info  Sight, Hearing, Speech Sight Info: Adequate Hearing Info: Adequate Speech Info: Adequate    SPECIAL CARE FACTORS FREQUENCY   (Home Health nursing )                    Contractures       Additional Factors Info  Code Status, Allergies Code Status Info:  (Full Code. ) Allergies Info:  (Penicillins)           Current Medications (11/19/2016):  This is the current hospital active medication list Current Facility-Administered Medications  Medication Dose Route Frequency Provider Last Rate Last Dose  . 0.9 %  sodium chloride infusion   Intravenous Continuous Hugelmeyer, Alexis, DO 75 mL/hr at 11/19/16 0251    . 0.9 %  sodium chloride infusion   Intravenous Once Hugelmeyer, Alexis, DO      . acetaminophen (TYLENOL) tablet 650 mg  650 mg Oral Q6H PRN Hugelmeyer, Alexis, DO       Or  . acetaminophen (TYLENOL) suppository 650 mg  650 mg Rectal Q6H PRN Hugelmeyer, Alexis, DO      . albuterol (PROVENTIL) (2.5 MG/3ML) 0.083% nebulizer solution 2.5 mg  2.5 mg Nebulization Q6H PRN Hugelmeyer, Alexis, DO      . bisacodyl (DULCOLAX) EC tablet 5 mg  5 mg Oral Daily PRN Hugelmeyer, Alexis, DO      . buPROPion (WELLBUTRIN XL) 24 hr tablet 150 mg  150 mg Oral Daily Hugelmeyer, Alexis, DO   150 mg at 11/19/16 0825  . clonazePAM (KLONOPIN) tablet 0.5 mg  0.5 mg Oral QHS Hugelmeyer, Alexis, DO      . cloZAPine (CLOZARIL) tablet 450 mg  450 mg Oral QHS Hugelmeyer, Alexis, DO      . docusate sodium (COLACE) capsule 200 mg  200  mg Oral BID Hugelmeyer, Alexis, DO   200 mg at 11/19/16 0824  . insulin aspart (novoLOG) injection 0-15 Units  0-15 Units Subcutaneous TID WC Hugelmeyer, Alexis, DO      . insulin aspart (novoLOG) injection 0-5 Units  0-5 Units Subcutaneous QHS Hugelmeyer, Alexis, DO      . ipratropium (ATROVENT) 0.06 % nasal spray 1 spray  1 spray Each Nare QHS Hugelmeyer, Alexis, DO      . ipratropium (ATROVENT) nebulizer solution 0.5 mg  0.5 mg Nebulization Q6H PRN Hugelmeyer, Alexis, DO      . lithium carbonate (ESKALITH) CR tablet 450 mg  450 mg Oral Q12H Hugelmeyer, Alexis, DO   450 mg at 11/19/16 0824  . magnesium citrate solution 1 Bottle  1 Bottle Oral Once PRN Hugelmeyer,  Alexis, DO      . mometasone-formoterol (DULERA) 200-5 MCG/ACT inhaler 2 puff  2 puff Inhalation BID Hugelmeyer, Alexis, DO   2 puff at 11/19/16 0825  . nicotine (NICODERM CQ - dosed in mg/24 hours) patch 21 mg  21 mg Transdermal Daily Pyreddy, Vivien RotaPavan, MD   21 mg at 11/19/16 0825  . ondansetron (ZOFRAN) tablet 4 mg  4 mg Oral Q6H PRN Hugelmeyer, Alexis, DO       Or  . ondansetron (ZOFRAN) injection 4 mg  4 mg Intravenous Q6H PRN Hugelmeyer, Alexis, DO      . polyethylene glycol (MIRALAX / GLYCOLAX) packet 17 g  17 g Oral Daily Hugelmeyer, Alexis, DO   17 g at 11/19/16 0825  . senna-docusate (Senokot-S) tablet 1 tablet  1 tablet Oral QHS PRN Hugelmeyer, Alexis, DO         Discharge Medications: Please see discharge summary for a list of discharge medications.  Relevant Imaging Results:  Relevant Lab Results:   Additional Information  (SSN: [id number])  Randalyn Ahmed, Darleen CrockerBailey M, LCSW

## 2016-11-19 NOTE — Care Management (Addendum)
RNCM notified by CSW that patient is from group home. Patient has presented to hospital 3 times over last 6 months. [facility] can provide home psych nursing along with PT/INR blood labs to check Coumadin levels if medically necessary. Per CSW, group home agreed with home health services without preference. Please also add social worker to home health order. Referral to Tim with [facility].

## 2016-11-19 NOTE — H&P (Signed)
History and Physical   [facility] [place] [facility] Admission History and Physical [employer] Tamira Ryland, D.O.    Patient Name: Lance CrutchJames [name] MR#: [id number] Date of Birth: May 28, 1976 Date of Admission: 11/18/2016  Referring MD/NP/PA: Dr. Mayford KnifeWilliams Primary Care Physician: Corky DownsMasoud, Javed, MD Patient coming from: Group Home   Chief Complaint:  Chief Complaint  Patient presents with  . Hematuria    HPI: Lance [name] is a 41 y.o. male with a known history of DM, HTN, HLD< PE, schizoaffective disorder presents to the emergency department for evaluation of hematuria.  Patient was in a usual state of health until today when he noticed bright red urine.  Has been on Coumadin for many years for PE and has never had any problems.   Patient denies fevers/chills, weakness, dizziness, chest pain, shortness of breath, N/V/C/D, abdominal pain, dysuria/frequency, changes in mental status.    Otherwise there has been no change in status. Patient has been taking medication as prescribed and there has been no recent change in medication or diet.  No recent antibiotics.  There has been no recent illness, hospitalizations, travel or sick contacts.    EMS/ED Course: Patient received FFP, Vitamin K.  Review of Systems:  CONSTITUTIONAL: No fever/chills, fatigue, weakness, weight gain/loss, headache. EYES: No blurry or double vision. ENT: No tinnitus, postnasal drip, redness or soreness of the oropharynx. RESPIRATORY: No cough, dyspnea, wheeze.  No hemoptysis.  CARDIOVASCULAR: No chest pain, palpitations, syncope, orthopnea. No lower extremity edema.  GASTROINTESTINAL: No nausea, vomiting, abdominal pain, diarrhea, constipation.  No hematemesis, melena or hematochezia. GENITOURINARY: No dysuria, frequency. Positive hematuria. ENDOCRINE: No polyuria or nocturia. No heat or cold intolerance. HEMATOLOGY: No anemia, bruising, bleeding. INTEGUMENTARY: No rashes, ulcers, lesions. MUSCULOSKELETAL: No arthritis,  gout, dyspnea. NEUROLOGIC: No numbness, tingling, ataxia, seizure-type activity, weakness. PSYCHIATRIC: No anxiety, depression, insomnia.   Past Medical History:  Diagnosis Date  . Depression   . Diabetes mellitus without complication (HCC)   . Hyperlipidemia   . Hypertension   . PE (pulmonary thromboembolism) (HCC)   . Schizo affective schizophrenia (HCC)     History reviewed. No pertinent surgical history.   reports that he has been smoking Cigarettes.  He has a 23.00 pack-year smoking history. He has never used smokeless tobacco. He reports that he uses drugs, including Marijuana. He reports that he does not drink alcohol.  Allergies  Allergen Reactions  . Penicillins Other (See Comments)    Reaction: "lockjaw" Has patient had a PCN reaction causing immediate rash, facial/tongue/throat swelling, SOB or lightheadedness with hypotension: No Has patient had a PCN reaction causing severe rash involving mucus membranes or skin necrosis: No Has patient had a PCN reaction that required hospitalization: No Has patient had a PCN reaction occurring within the last 10 years: No If all of the above answers are "NO", then may proceed with Cephalosporin use.     Family History  Problem Relation Age of Onset  . CAD Mother   . CAD Sister     Prior to Admission medications   Medication Sig Start Date End Date Taking? Authorizing Provider  buPROPion (WELLBUTRIN XL) 150 MG 24 hr tablet Take 1 tablet (150 mg total) by mouth daily. 09/15/16   Jimmy FootmanHernandez-Gonzalez, Andrea, MD  clonazePAM (KLONOPIN) 0.5 MG tablet Take 1 tablet (0.5 mg total) by mouth at bedtime. 09/14/16   Jimmy FootmanHernandez-Gonzalez, Andrea, MD  cloZAPine (CLOZARIL) 100 MG tablet Take 4.5 tablets (450 mg total) by mouth at bedtime. 09/14/16   Jimmy FootmanHernandez-Gonzalez, Andrea, MD  docusate sodium (COLACE) 100 MG capsule Take 2 capsules (200 mg total) by mouth 2 (two) times daily. 09/14/16   Jimmy Footman, MD  Fluticasone-Salmeterol  (ADVAIR) 250-50 MCG/DOSE AEPB Inhale 1 puff into the lungs 2 (two) times daily.    [provider]  ipratropium (ATROVENT) 0.06 % nasal spray Place 1 spray into both nostrils at bedtime. 09/14/16   Jimmy Footman, MD  lithium carbonate (ESKALITH) 450 MG CR tablet Take 1 tablet (450 mg total) by mouth every 12 (twelve) hours. 09/14/16   Jimmy Footman, MD  metFORMIN (FORTAMET) 1000 MG (OSM) 24 hr tablet Take 1 tablet (1,000 mg total) by mouth 2 (two) times daily with a meal. 09/14/16   Jimmy Footman, MD  nicotine (NICOTROL) 10 MG inhaler Inhale 1 Cartridge (1 continuous puffing total) into the lungs as needed for smoking cessation. 09/14/16   Jimmy Footman, MD  polyethylene glycol (MIRALAX / GLYCOLAX) packet Take 17 g by mouth daily. 09/15/16   Jimmy Footman, MD  simvastatin (ZOCOR) 40 MG tablet Take 40 mg by mouth at bedtime.    [provider]  warfarin (COUMADIN) 7.5 MG tablet Take 1 tablet (7.5 mg total) by mouth daily at 6 PM. 09/14/16   Jimmy Footman, MD    Physical Exam: Vitals:   11/18/16 2243 11/18/16 2306 11/18/16 2332 11/18/16 2351  BP:  (!) 143/84 131/89 140/86  Pulse: 68 65 69 65  Resp: 19 18 18 19   Temp:   98 F (36.7 C) 98 F (36.7 C)  TempSrc:   Oral Oral  SpO2: 99% 99% 100% 100%  Weight:      Height:        GENERAL: 41 y.o.-year-old male patient, well-developed, well-nourished lying in the bed in no acute distress.  Pleasant and cooperative.   HEENT: Head atraumatic, normocephalic. Pupils equal, round, reactive to light and accommodation. No scleral icterus. Extraocular muscles intact. Nares are patent. Oropharynx is clear. Mucus membranes moist. NECK: Supple, full range of motion. No JVD, no bruit heard. No thyroid enlargement, no tenderness, no cervical lymphadenopathy. CHEST: Normal breath sounds bilaterally. No wheezing, rales, rhonchi or crackles. No use of accessory muscles of  respiration.  No reproducible chest wall tenderness.  CARDIOVASCULAR: S1, S2 normal. No murmurs, rubs, or gallops. Cap refill <2 seconds. Pulses intact distally.  ABDOMEN: Soft, nondistended, nontender. No rebound, guarding, rigidity. Normoactive bowel sounds present in all four quadrants. No organomegaly or mass. EXTREMITIES: No pedal edema, cyanosis, or clubbing. No calf tenderness or Homan's sign.  NEUROLOGIC: The patient is alert and oriented x 3. Cranial nerves II through XII are grossly intact with no focal sensorimotor deficit. Muscle strength 5/5 in all extremities. Sensation intact. Gait not checked. PSYCHIATRIC:  Normal affect, mood, thought content. SKIN: Warm, dry, and intact without obvious rash, lesion, or ulcer.    Labs on Admission:  CBC:  Recent Labs Lab 11/18/16 2102  WBC 6.3  NEUTROABS 3.9  HGB 15.0  HCT 42.9  MCV 88.2  PLT 198   Basic Metabolic Panel:  Recent Labs Lab 11/18/16 2102  NA 144  K 3.3*  CL 112*  CO2 25  GLUCOSE 103*  BUN 16  CREATININE 1.04  CALCIUM 9.4   GFR: Estimated Creatinine Clearance: 108.1 mL/min (by C-G formula based on SCr of 1.04 mg/dL). Liver Function Tests:  Recent Labs Lab 11/18/16 2102  AST 31  ALT 31  ALKPHOS 57  BILITOT 0.5  PROT 6.7  ALBUMIN 4.2   No results for input(s): LIPASE,  AMYLASE in the last 168 hours. No results for input(s): AMMONIA in the last 168 hours. Coagulation Profile:  Recent Labs Lab 11/18/16 2102  INR >10.00*   Cardiac Enzymes: No results for input(s): CKTOTAL, CKMB, CKMBINDEX, TROPONINI in the last 168 hours. BNP (last 3 results) No results for input(s): PROBNP in the last 8760 hours. HbA1C: No results for input(s): HGBA1C in the last 72 hours. CBG: No results for input(s): GLUCAP in the last 168 hours. Lipid Profile: No results for input(s): CHOL, HDL, LDLCALC, TRIG, CHOLHDL, LDLDIRECT in the last 72 hours. Thyroid Function Tests: No results for input(s): TSH, T4TOTAL,  FREET4, T3FREE, THYROIDAB in the last 72 hours. Anemia Panel: No results for input(s): VITAMINB12, FOLATE, FERRITIN, TIBC, IRON, RETICCTPCT in the last 72 hours. Urine analysis:    Component Value Date/Time   COLORURINE RED (A) 11/18/2016 2030   APPEARANCEUR CLOUDY (A) 11/18/2016 2030   APPEARANCEUR Clear 03/26/2013 0043   LABSPEC 1.020 11/18/2016 2030   LABSPEC 1.015 03/26/2013 0043   PHURINE  11/18/2016 2030    TEST NOT REPORTED DUE TO COLOR INTERFERENCE OF URINE PIGMENT   GLUCOSEU (A) 11/18/2016 2030    TEST NOT REPORTED DUE TO COLOR INTERFERENCE OF URINE PIGMENT   GLUCOSEU Negative 03/26/2013 0043   HGBUR (A) 11/18/2016 2030    TEST NOT REPORTED DUE TO COLOR INTERFERENCE OF URINE PIGMENT   BILIRUBINUR (A) 11/18/2016 2030    TEST NOT REPORTED DUE TO COLOR INTERFERENCE OF URINE PIGMENT   BILIRUBINUR Negative 03/26/2013 0043   KETONESUR (A) 11/18/2016 2030    TEST NOT REPORTED DUE TO COLOR INTERFERENCE OF URINE PIGMENT   PROTEINUR (A) 11/18/2016 2030    TEST NOT REPORTED DUE TO COLOR INTERFERENCE OF URINE PIGMENT   NITRITE (A) 11/18/2016 2030    TEST NOT REPORTED DUE TO COLOR INTERFERENCE OF URINE PIGMENT   LEUKOCYTESUR (A) 11/18/2016 2030    TEST NOT REPORTED DUE TO COLOR INTERFERENCE OF URINE PIGMENT   LEUKOCYTESUR Negative 03/26/2013 0043   Sepsis Labs: @LABRCNTIP (procalcitonin:4,lacticidven:4) )No results found for this or any previous visit (from the past 240 hour(s)).   Radiological Exams on Admission: No results found.  Assessment/Plan  This is a 41 y.o. male with a history of  DM, HTN, HLD, PE, schizoaffective disorder now being admitted with:  #. Supratherapeutic INR - Admit inpatient - Reverse INR with FFP and Vitamin K - Pharmacy consult  - CBC q4hours and repeat PT/INR  #. Gross hematuria secondary to above - Monitor as INR improves  #. Hypokalemia - Replace PO - Recheck BMP in AM  #. History of Diabetes - Accuchecks achs with RISS coverage -  Heart healthy, carb controlled diet  #. History of schizoaffective, depression - Continue Wellbutrin, Klonopin, Clozaril, Lithium  #. History of HLD - Continue Zocor  Admission status: Inpatient IV Fluids: NS Diet/Nutrition: Heart healthy, carb controlled Consults called: None  DVT Px: SCDs and early ambulation. Code Status: Full Code  Disposition Plan: To home in 2-3 days  All the records are reviewed and case discussed with ED provider. Management plans discussed with the patient and/or family who express understanding and agree with plan of care.  Yury Schaus D.O. on 11/19/2016 at 12:14 AM Between 7am to 6pm - Pager - [phone] After 6pm go to [email or web address] - Social research officer, government [facility] [place] [facility] Office [phone] CC: Primary care physician; Corky Downs, MD   11/19/2016, 12:14 AM

## 2016-11-20 LAB — GLUCOSE, CAPILLARY
GLUCOSE-CAPILLARY: 117 mg/dL — AB (ref 65–99)
Glucose-Capillary: 90 mg/dL (ref 65–99)

## 2016-11-20 LAB — CBC
HCT: 43.4 % (ref 40.0–52.0)
Hemoglobin: 14.9 g/dL (ref 13.0–18.0)
MCH: 30.3 pg (ref 26.0–34.0)
MCHC: 34.4 g/dL (ref 32.0–36.0)
MCV: 87.9 fL (ref 80.0–100.0)
PLATELETS: 196 10*3/uL (ref 150–440)
RBC: 4.93 MIL/uL (ref 4.40–5.90)
RDW: 13.2 % (ref 11.5–14.5)
WBC: 5 10*3/uL (ref 3.8–10.6)

## 2016-11-20 LAB — PREPARE FRESH FROZEN PLASMA: Unit division: 0

## 2016-11-20 LAB — URINE CULTURE: Culture: <10000 — AB

## 2016-11-20 LAB — HIV ANTIBODY (ROUTINE TESTING W REFLEX): HIV Screen 4th Generation wRfx: NONREACTIVE

## 2016-11-20 LAB — BPAM FFP
Blood Product Expiration Date: 201806122359
ISSUE DATE / TIME: 201806070719
UNIT TYPE AND RH: 6200

## 2016-11-20 LAB — PROTIME-INR
INR: 1.52
Prothrombin Time: 18.5 seconds — ABNORMAL HIGH (ref 11.4–15.2)

## 2016-11-20 MED ORDER — MOMETASONE FURO-FORMOTEROL FUM 200-5 MCG/ACT IN AERO: 2.0000 | INHALATION_SPRAY | Freq: Two times a day (BID) | RESPIRATORY_TRACT | 1 refills | Status: DC

## 2016-11-20 MED ORDER — WARFARIN SODIUM 3 MG PO TABS: 7.5000 mg | ORAL_TABLET | Freq: Every day | ORAL | 0 refills | Status: DC

## 2016-11-20 NOTE — Care Management (Signed)
Tim with [facility] notified of patient discharge to his home today. No further RNCM needs. CSW will follow.

## 2016-11-20 NOTE — [place] FL2 (Signed)
[place] MEDICAID FL2 LEVEL OF CARE SCREENING TOOL     IDENTIFICATION  Patient Name: Lance [name] Birthdate: November 20, 1975 Sex: male Admission Date (Current Location): 11/18/2016  [facility] [place]  [place]  ([id number] [facility]) Facility and Address:  [facility], [address], [place], [place] [address]      Provider Number: [id number]  Attending Physician Name and Address:  Altamese DillingVachhani, Mikki Ziff, *  Relative Name and Phone Number:       Current Level of Care: Hospital Recommended Level of Care: Other (Comment) (Group Home ) Prior Approval Number:    Date Approved/Denied:   PASRR Number:  [phone] K)  Discharge Plan: Other (Comment) (Group Home )    Current Diagnoses: Patient Active Problem List   Diagnosis Date Noted  . Supratherapeutic INR 11/19/2016  . Tobacco use disorder 09/01/2016  . Schizoaffective disorder, bipolar type (HCC) 05/22/2016  . Diabetes mellitus without complication (HCC) 05/21/2016  . Hyperlipidemia 05/21/2016  . History of pulmonary embolism 05/21/2016  . Chronic anticoagulation 05/21/2016  . Hypertension 09/25/2015    Orientation RESPIRATION BLADDER Height & Weight     Self, Time, Situation, Place  Normal Continent Weight: 196 lb 8 oz (89.1 kg) Height:  5' 10.5" (179.1 cm)  BEHAVIORAL SYMPTOMS/MOOD NEUROLOGICAL BOWEL NUTRITION STATUS   (none)  (none) Continent Diet (Diet: Heart Healthy/ Carb Modified )  AMBULATORY STATUS COMMUNICATION OF NEEDS Skin   Independent Verbally Normal                       Personal Care Assistance Level of Assistance  Bathing, Feeding, Dressing Bathing Assistance: Independent Feeding assistance: Independent Dressing Assistance: Independent     Functional Limitations Info  Sight, Hearing, Speech Sight Info: Adequate Hearing Info: Adequate Speech Info: Adequate    SPECIAL CARE FACTORS FREQUENCY   (Home Health nursing )                    Contractures       Additional Factors Info  Code Status, Allergies Code Status Info:  (Full Code. ) Allergies Info:  (Penicillins)          Discharge Medications: Please see discharge summary for a list of discharge medications. Current Discharge Medication List        START taking these medications   Details  mometasone-formoterol (DULERA) 200-5 MCG/ACT AERO Inhale 2 puffs into the lungs 2 (two) times daily. Qty: 1 Inhaler, Refills: 1          CONTINUE these medications which have CHANGED   Details  warfarin (COUMADIN) 3 MG tablet Take 2.5 tablets (7.5 mg total) by mouth daily at 6 PM. Qty: 20 tablet, Refills: 0          CONTINUE these medications which have NOT CHANGED   Details  asenapine (SAPHRIS) 5 MG SUBL 24 hr tablet Place 5 mg under the tongue 3 (three) times daily.    insulin detemir (LEVEMIR) 100 UNIT/ML injection Inject 14 Units into the skin every morning.    loperamide (IMODIUM A-D) 2 MG tablet Take 2 mg by mouth 4 (four) times daily as needed for diarrhea or loose stools.    metformin (FORTAMET) 500 MG (OSM) 24 hr tablet Take 500 mg by mouth 2 (two) times daily with a meal.    omega-3 acid ethyl esters (LOVAZA) 1 g capsule Take 1 g by mouth daily.    ondansetron (ZOFRAN-ODT) 4 MG disintegrating tablet Take 4 mg by mouth every 8 (  eight) hours as needed for nausea or vomiting.    ziprasidone (GEODON) 80 MG capsule Take 80 mg by mouth 2 (two) times daily with a meal.    zolpidem (AMBIEN) 5 MG tablet Take 5 mg by mouth at bedtime as needed for sleep.    buPROPion (WELLBUTRIN XL) 150 MG 24 hr tablet Take 1 tablet (150 mg total) by mouth daily. Qty: 30 tablet, Refills: 0    cloZAPine (CLOZARIL) 100 MG tablet Take 4.5 tablets (450 mg total) by mouth at bedtime. Qty: 135 tablet, Refills: 0    docusate sodium (COLACE) 100 MG capsule Take 2 capsules (200 mg total) by mouth 2 (two) times daily. Qty: 120 capsule, Refills: 0    ipratropium (ATROVENT) 0.06 %  nasal spray Place 1 spray into both nostrils at bedtime. Qty: 3 mL, Refills: 0    lithium carbonate (ESKALITH) 450 MG CR tablet Take 1 tablet (450 mg total) by mouth every 12 (twelve) hours. Qty: 60 tablet, Refills: 0    nicotine (NICOTROL) 10 MG inhaler Inhale 1 Cartridge (1 continuous puffing total) into the lungs as needed for smoking cessation. Qty: 60 each, Refills: 0    polyethylene glycol (MIRALAX / GLYCOLAX) packet Take 17 g by mouth daily. Qty: 30 each, Refills: 0    simvastatin (ZOCOR) 40 MG tablet Take 40 mg by mouth at bedtime.         STOP taking these medications     clonazePAM (KLONOPIN) 0.5 MG tablet      Fluticasone-Salmeterol (ADVAIR) 250-50 MCG/DOSE AEPB     Relevant Imaging Results: Relevant Lab Results: Additional Information  (SSN: [id number])  Sample, Darleen Crocker, LCSW

## 2016-11-20 NOTE — Progress Notes (Signed)
Pt remaining alert and oriented. No complaints during the night. Pt and safety sitter reporting that he no longer has hematuria urine is now clear.

## 2016-11-20 NOTE — Progress Notes (Signed)
Resumed care at 1600. Patient waiting on ride back to Group Home.  Harvie HeckMelanie Christyn Gutkowski, RN

## 2016-11-20 NOTE — Progress Notes (Signed)
Patient's ride is here to pick patient up. Patient being walked out with staff member. Packet with patient.  Harvie HeckMelanie Jaramie Bastos, RN

## 2016-11-20 NOTE — Progress Notes (Signed)
Patient is medically stable for D/C back to [facility] [place], [place] today. Psych MD has cleared patient to D/C back to group home. Per Corrie DandyMary group home owner patient can return today and she will send someone to pick him up between 4 and 4:30 today. Clinical Child psychotherapistocial Worker (CSW) sent D/C orders to group home and prepared D/C packet. Patient is aware of above. CSW was not able to get guardian's telephone number to contact her. RN aware of above. Please reconsult if future social work needs arise. CSW signing off.   [employer] Hughes IncorporatedBailey Janssen Zee, LCSW [phone]

## 2016-11-20 NOTE — Consult Note (Signed)
[facility] Consult   Reason for Consult:  Consult for 41 year old man with schizophrenia who is currently in the hospital for complaints of hematuria Referring Physician:  Marthann Schiller Patient Identification: Lance [name] MRN:  [id number] Principal Diagnosis: Schizoaffective disorder, bipolar type [facility]) Diagnosis:   Patient Active Problem List   Diagnosis Date Noted  . Supratherapeutic INR [R79.1] 11/19/2016  . Tobacco use disorder [F17.200] 09/01/2016  . Schizoaffective disorder, bipolar type ([place]) [F25.0] 05/22/2016  . Diabetes mellitus without complication ([place]) [N82.9] 05/21/2016  . Hyperlipidemia [E78.5] 05/21/2016  . History of pulmonary embolism [Z86.711] 05/21/2016  . Chronic anticoagulation [Z79.01] 05/21/2016  . Hypertension [I10] 09/25/2015    Total Time spent with patient: 20 minutes  Subjective:   Lance [name] is a 41 y.o. male patient admitted with "[name]".  Follow-up for this 41 year old man with schizophrenia. He has stopped having blood in his urine thanks to correction of his coagulation tests. He says his mood is feeling much better. He is not having any suicidal thoughts at all. He feels more rational and has positive plans for the future. Not actively psychotic paranoid or bizarre. Compliant with treatment. Affect calm and pleasant and apparently lucid.  HPI:  Patient interviewed chart reviewed. Also spoke with social work. Patient also well known from previous encounters. This is a 41 year old man with chronic mental health issues who is currently on the medical service because of complaints of visible hematuria. On admission workup he was found to have an INR greater than 10 suggesting inappropriate dosing and monitoring of his Coumadin therapy. Most likely cause of his bleeding. Patient made statements to staff on the ward about being suicidal prompting the consult. On interview today the patient says he has been feeling "suicidal" for  about 3 or 4 days. He was walking around outside the other day and found himself having the thought of throwing himself in front of a truck. He talks at some length about how limited and empty his life is. He feels down most of the time. Doesn't really seem to feel like he has much positive going on in his life. Doesn't report any particular change to his sleep however or his appetite. He denies that he is having auditory hallucinations currently and in the conversation did not appear to be paranoid or obviously psychotic. Air in the medical service he has already pulled out an IV one time for no good reason. He is being maintained on his usual psychiatric medicines.  Social history: Patient doesn't really have much contact with any family. He has a guardian from one of the social services. He lives in a group home where he has been for quite a while.  Medical history: He has a history of insulin-dependent diabetes and also a history of repeated pulmonary embolisms resulting in chronic anticoagulation therapy.  Substance abuse history: Distant history of abuse of marijuana. Doesn't use drugs or alcohol regularly anymore. Past Psychiatric History: Patient has a long history of schizophrenia or schizoaffective disorder. Multiple hospitalizations. When he is off medications for at least in the past I have seen him be extremely psychotic and paranoid. Since being on clozapine he has been doing much better in terms of his mental health issues. He does have a history of aggression and of suicide attempts in the past.  Risk to Self: Is patient at risk for suicide?: Yes Risk to Others:   Prior Inpatient Therapy:   Prior Outpatient Therapy:    Past Medical History:  Past Medical History:  Diagnosis Date  . Depression   . Diabetes mellitus without complication ([place])   . Hyperlipidemia   . Hypertension   . Lupus anticoagulant disorder ([place]) 06/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  . PE  (pulmonary thromboembolism) ([place])   . Schizo affective schizophrenia ([place])    History reviewed. No pertinent surgical history. Family History:  Family History  Problem Relation Age of Onset  . CAD Mother   . CAD Sister    Family Psychiatric  History: No known family history of serious mental illness Social History:  History  Alcohol Use No    Comment: occassionally     History  Drug Use  . Types: Marijuana    Social History   Social History  . Marital status: Single    Spouse name: N/A  . Number of children: N/A  . Years of education: N/A   Social History Main Topics  . Smoking status: Current Every Day Smoker    Packs/day: 1.00    Years: 23.00    Types: Cigarettes  . Smokeless tobacco: Never Used  . Alcohol use No     Comment: occassionally  . Drug use: Yes    Types: Marijuana  . Sexual activity: No     Comment: occasional marijuana- none recently   Other Topics Concern  . None   Social History Narrative   From a group home in [place]   Additional Social History:    Allergies:   Allergies  Allergen Reactions  . Penicillins Other (See Comments)    Reaction: "lockjaw" Has patient had a PCN reaction causing immediate rash, facial/tongue/throat swelling, SOB or lightheadedness with hypotension: No Has patient had a PCN reaction causing severe rash involving mucus membranes or skin necrosis: No Has patient had a PCN reaction that required hospitalization: No Has patient had a PCN reaction occurring within the last 10 years: No If all of the above answers are "NO", then may proceed with Cephalosporin use.     Labs:  Results for orders placed or performed during the hospital encounter of 11/18/16 (from the past 48 hour(s))  Urinalysis, Complete w Microscopic     Status: Abnormal   Collection Time: 11/18/16  8:30 PM  Result Value Ref Range   Color, Urine RED (A) YELLOW   APPearance CLOUDY (A) CLEAR   Specific Gravity, Urine 1.020 1.005 - 1.030   pH   5.0 - 8.0    TEST NOT REPORTED DUE TO COLOR INTERFERENCE OF URINE PIGMENT   Glucose, UA (A) NEGATIVE mg/dL    TEST NOT REPORTED DUE TO COLOR INTERFERENCE OF URINE PIGMENT   Hgb urine dipstick (A) NEGATIVE    TEST NOT REPORTED DUE TO COLOR INTERFERENCE OF URINE PIGMENT   Bilirubin Urine (A) NEGATIVE    TEST NOT REPORTED DUE TO COLOR INTERFERENCE OF URINE PIGMENT   Ketones, ur (A) NEGATIVE mg/dL    TEST NOT REPORTED DUE TO COLOR INTERFERENCE OF URINE PIGMENT   Protein, ur (A) NEGATIVE mg/dL    TEST NOT REPORTED DUE TO COLOR INTERFERENCE OF URINE PIGMENT   Nitrite (A) NEGATIVE    TEST NOT REPORTED DUE TO COLOR INTERFERENCE OF URINE PIGMENT   Leukocytes, UA (A) NEGATIVE    TEST NOT REPORTED DUE TO COLOR INTERFERENCE OF URINE PIGMENT   RBC / HPF TOO NUMEROUS TO COUNT 0 - 5 RBC/hpf   WBC, UA TOO NUMEROUS TO COUNT 0 - 5 WBC/hpf   Bacteria, UA NONE  SEEN NONE SEEN   Squamous Epithelial / LPF NONE SEEN NONE SEEN  Urine culture     Status: Abnormal   Collection Time: 11/18/16  8:30 PM  Result Value Ref Range   Specimen Description URINE, CLEAN CATCH    Special Requests NONE    Culture (A)     <10,000 COLONIES/mL INSIGNIFICANT GROWTH Performed at [facility], [address] [place], [place] [address]    Report Status 11/20/2016 FINAL   CBC with Differential/Platelet     Status: None   Collection Time: 11/18/16  9:02 PM  Result Value Ref Range   WBC 6.3 3.8 - 10.6 K/uL   RBC 4.86 4.40 - 5.90 MIL/uL   Hemoglobin 15.0 13.0 - 18.0 g/dL   HCT 42.9 40.0 - 52.0 %   MCV 88.2 80.0 - 100.0 fL   MCH 31.0 26.0 - 34.0 pg   MCHC 35.1 32.0 - 36.0 g/dL   RDW 12.9 11.5 - 14.5 %   Platelets 198 150 - 440 K/uL   Neutrophils Relative % 63 %   Neutro Abs 3.9 1.4 - 6.5 K/uL   Lymphocytes Relative 26 %   Lymphs Abs 1.6 1.0 - 3.6 K/uL   Monocytes Relative 11 %   Monocytes Absolute 0.7 0.2 - 1.0 K/uL   Eosinophils Relative 0 %   Eosinophils Absolute 0.0 0 - 0.7 K/uL   Basophils Relative 0 %    Basophils Absolute 0.0 0 - 0.1 K/uL  Comprehensive metabolic panel     Status: Abnormal   Collection Time: 11/18/16  9:02 PM  Result Value Ref Range   Sodium 144 135 - 145 mmol/L   Potassium 3.3 (L) 3.5 - 5.1 mmol/L   Chloride 112 (H) 101 - 111 mmol/L   CO2 25 22 - 32 mmol/L   Glucose, Bld 103 (H) 65 - 99 mg/dL   BUN 16 6 - 20 mg/dL   Creatinine, Ser 1.04 0.61 - 1.24 mg/dL   Calcium 9.4 8.9 - 10.3 mg/dL   Total Protein 6.7 6.5 - 8.1 g/dL   Albumin 4.2 3.5 - 5.0 g/dL   AST 31 15 - 41 U/L   ALT 31 17 - 63 U/L   Alkaline Phosphatase 57 38 - 126 U/L   Total Bilirubin 0.5 0.3 - 1.2 mg/dL   GFR calc non Af Amer >60 >60 mL/min   GFR calc Af Amer >60 >60 mL/min    Comment: (NOTE) The eGFR has been calculated using the CKD EPI equation. This calculation has not been validated in all clinical situations. eGFR's persistently <60 mL/min signify possible Chronic Kidney Disease.    Anion gap 7 5 - 15  Protime-INR     Status: Abnormal   Collection Time: 11/18/16  9:02 PM  Result Value Ref Range   Prothrombin Time >90.0 (H) 11.4 - 15.2 seconds   INR >10.00 (HH)     Comment: CRITICAL RESULT CALLED TO, READ BACK BY AND VERIFIED WITH: KENDALL MOFFITT 11/18/16 @ 2208  MLK   ABO/Rh     Status: None   Collection Time: 11/18/16  9:02 PM  Result Value Ref Range   ABO/RH(D) A POS   Magnesium     Status: None   Collection Time: 11/18/16  9:02 PM  Result Value Ref Range   Magnesium 1.9 1.7 - 2.4 mg/dL  Phosphorus     Status: None   Collection Time: 11/18/16  9:02 PM  Result Value Ref Range   Phosphorus 3.2 2.5 -  4.6 mg/dL  Prepare fresh frozen plasma     Status: None   Collection Time: 11/18/16 10:18 PM  Result Value Ref Range   Unit Number [id number]    Blood Component Type THWPLS APHR1    Unit division 00    Status of Unit ISSUED,FINAL    Transfusion Status OK TO TRANSFUSE   Prepare fresh frozen plasma     Status: None   Collection Time: 11/19/16  2:30 AM  Result Value Ref Range    Unit Number [id number]    Blood Component Type THWPLS APHR2    Unit division 00    Status of Unit REL FROM [facility]    Transfusion Status OK TO TRANSFUSE   Basic metabolic panel     Status: Abnormal   Collection Time: 11/19/16  2:44 AM  Result Value Ref Range   Sodium 143 135 - 145 mmol/L   Potassium 3.4 (L) 3.5 - 5.1 mmol/L   Chloride 112 (H) 101 - 111 mmol/L   CO2 25 22 - 32 mmol/L   Glucose, Bld 100 (H) 65 - 99 mg/dL   BUN 15 6 - 20 mg/dL   Creatinine, Ser 0.92 0.61 - 1.24 mg/dL   Calcium 8.9 8.9 - 10.3 mg/dL   GFR calc non Af Amer >60 >60 mL/min   GFR calc Af Amer >60 >60 mL/min    Comment: (NOTE) The eGFR has been calculated using the CKD EPI equation. This calculation has not been validated in all clinical situations. eGFR's persistently <60 mL/min signify possible Chronic Kidney Disease.    Anion gap 6 5 - 15  CBC     Status: Abnormal   Collection Time: 11/19/16  2:44 AM  Result Value Ref Range   WBC 5.6 3.8 - 10.6 K/uL   RBC 4.54 4.40 - 5.90 MIL/uL   Hemoglobin 14.1 13.0 - 18.0 g/dL   HCT 39.5 (L) 40.0 - 52.0 %   MCV 86.9 80.0 - 100.0 fL   MCH 30.9 26.0 - 34.0 pg   MCHC 35.6 32.0 - 36.0 g/dL   RDW 12.6 11.5 - 14.5 %   Platelets 174 150 - 440 K/uL  Protime-INR     Status: Abnormal   Collection Time: 11/19/16  2:44 AM  Result Value Ref Range   Prothrombin Time 31.9 (H) 11.4 - 15.2 seconds   INR 3.01   APTT     Status: Abnormal   Collection Time: 11/19/16  2:44 AM  Result Value Ref Range   aPTT 50 (H) 24 - 36 seconds    Comment:        IF BASELINE aPTT IS ELEVATED, SUGGEST PATIENT RISK ASSESSMENT BE USED TO DETERMINE APPROPRIATE ANTICOAGULANT THERAPY.   HIV antibody     Status: None   Collection Time: 11/19/16  2:44 AM  Result Value Ref Range   HIV Screen 4th Generation wRfx Non Reactive Non Reactive    Comment: (NOTE) Performed At: [facility] [place], [place] [id number] Lindon Romp MD [id number]   Glucose, capillary      Status: None   Collection Time: 11/19/16  7:35 AM  Result Value Ref Range   Glucose-Capillary 97 65 - 99 mg/dL  Glucose, capillary     Status: Abnormal   Collection Time: 11/19/16 11:35 AM  Result Value Ref Range   Glucose-Capillary 108 (H) 65 - 99 mg/dL  Glucose, capillary     Status: Abnormal   Collection Time: 11/19/16  5:02 PM  Result Value  Ref Range   Glucose-Capillary 112 (H) 65 - 99 mg/dL  Glucose, capillary     Status: None   Collection Time: 11/19/16  9:05 PM  Result Value Ref Range   Glucose-Capillary 87 65 - 99 mg/dL  Glucose, capillary     Status: None   Collection Time: 11/20/16  7:38 AM  Result Value Ref Range   Glucose-Capillary 90 65 - 99 mg/dL  Protime-INR     Status: Abnormal   Collection Time: 11/20/16  8:56 AM  Result Value Ref Range   Prothrombin Time 18.5 (H) 11.4 - 15.2 seconds   INR 1.52   CBC     Status: None   Collection Time: 11/20/16  8:56 AM  Result Value Ref Range   WBC 5.0 3.8 - 10.6 K/uL   RBC 4.93 4.40 - 5.90 MIL/uL   Hemoglobin 14.9 13.0 - 18.0 g/dL   HCT 43.4 40.0 - 52.0 %   MCV 87.9 80.0 - 100.0 fL   MCH 30.3 26.0 - 34.0 pg   MCHC 34.4 32.0 - 36.0 g/dL   RDW 13.2 11.5 - 14.5 %   Platelets 196 150 - 440 K/uL    Current Facility-Administered Medications  Medication Dose Route Frequency Provider Last Rate Last Dose  . 0.9 %  sodium chloride infusion   Intravenous Once Hugelmeyer, Alexis, DO      . acetaminophen (TYLENOL) tablet 650 mg  650 mg Oral Q6H PRN Hugelmeyer, Alexis, DO       Or  . acetaminophen (TYLENOL) suppository 650 mg  650 mg Rectal Q6H PRN Hugelmeyer, Alexis, DO      . albuterol (PROVENTIL) (2.5 MG/3ML) 0.083% nebulizer solution 2.5 mg  2.5 mg Nebulization Q6H PRN Hugelmeyer, Alexis, DO      . bisacodyl (DULCOLAX) EC tablet 5 mg  5 mg Oral Daily PRN Hugelmeyer, Alexis, DO      . buPROPion (WELLBUTRIN XL) 24 hr tablet 150 mg  150 mg Oral Daily Hugelmeyer, Alexis, DO   150 mg at 11/20/16 0946  . clonazePAM (KLONOPIN)  tablet 0.5 mg  0.5 mg Oral QHS Hugelmeyer, Alexis, DO   0.5 mg at 11/19/16 2112  . cloZAPine (CLOZARIL) tablet 450 mg  450 mg Oral QHS Hugelmeyer, Alexis, DO   450 mg at 11/19/16 2112  . docusate sodium (COLACE) capsule 200 mg  200 mg Oral BID Hugelmeyer, Alexis, DO   200 mg at 11/20/16 0945  . insulin aspart (novoLOG) injection 0-15 Units  0-15 Units Subcutaneous TID WC Hugelmeyer, Alexis, DO      . insulin aspart (novoLOG) injection 0-5 Units  0-5 Units Subcutaneous QHS Hugelmeyer, Alexis, DO      . ipratropium (ATROVENT) 0.06 % nasal spray 1 spray  1 spray Each Nare QHS Hugelmeyer, Alexis, DO   1 spray at 11/19/16 2113  . ipratropium (ATROVENT) nebulizer solution 0.5 mg  0.5 mg Nebulization Q6H PRN Hugelmeyer, Alexis, DO      . lithium carbonate (ESKALITH) CR tablet 450 mg  450 mg Oral Q12H Hugelmeyer, Alexis, DO   450 mg at 11/20/16 0946  . magnesium citrate solution 1 Bottle  1 Bottle Oral Once PRN Hugelmeyer, Alexis, DO      . mometasone-formoterol (DULERA) 200-5 MCG/ACT inhaler 2 puff  2 puff Inhalation BID Hugelmeyer, Alexis, DO   2 puff at 11/20/16 0945  . nicotine (NICODERM CQ - dosed in mg/24 hours) patch 21 mg  21 mg Transdermal Daily Pyreddy, Reatha Harps, MD   21 mg at 11/20/16 0948  .  ondansetron (ZOFRAN) tablet 4 mg  4 mg Oral Q6H PRN Hugelmeyer, Alexis, DO       Or  . ondansetron (ZOFRAN) injection 4 mg  4 mg Intravenous Q6H PRN Hugelmeyer, Alexis, DO      . polyethylene glycol (MIRALAX / GLYCOLAX) packet 17 g  17 g Oral Daily Hugelmeyer, Alexis, DO   17 g at 11/19/16 0825  . senna-docusate (Senokot-S) tablet 1 tablet  1 tablet Oral QHS PRN Hugelmeyer, Alexis, DO      . zolpidem (AMBIEN) tablet 5 mg  5 mg Oral QHS PRN Harrie Foreman, MD   5 mg at 11/19/16 2203    Musculoskeletal: Strength & Muscle Tone: within normal limits Gait & Station: normal Patient leans: N/A  Psychiatric Specialty Exam: Physical Exam  Nursing note and vitals reviewed. Constitutional: He appears  well-developed and well-nourished.  HENT:  Head: Normocephalic and atraumatic.  Eyes: Conjunctivae are normal. Pupils are equal, round, and reactive to light.  Neck: Normal range of motion.  Cardiovascular: Regular rhythm and normal heart sounds.   Respiratory: Effort normal. No respiratory distress.  GI: Soft.  Musculoskeletal: Normal range of motion.  Neurological: He is alert.  Skin: Skin is warm and dry.  Psychiatric: His speech is normal and behavior is normal. His affect is blunt. Cognition and memory are not impaired. He does not express impulsivity. He expresses no suicidal ideation. He expresses no suicidal plans.    Review of Systems  Constitutional: Negative.   HENT: Negative.   Eyes: Negative.   Respiratory: Negative.   Cardiovascular: Negative.   Gastrointestinal: Negative.   Genitourinary: Negative for hematuria.  Musculoskeletal: Negative.   Skin: Negative.   Neurological: Negative.   Psychiatric/Behavioral: Negative for depression, hallucinations, memory loss, substance abuse and suicidal ideas. The patient is nervous/anxious. The patient does not have insomnia.     Blood pressure 123/82, pulse 64, temperature 97.8 F (36.6 C), temperature source Axillary, resp. rate 17, height 5' 10.5" (1.791 m), weight 89.1 kg (196 lb 8 oz), SpO2 100 %.Body mass index is 27.8 kg/m.  General Appearance: Casual  Eye Contact:  Good  Speech:  Normal Rate  Volume:  Normal  Mood:  Euthymic  Affect:  Congruent  Thought Process:  Goal Directed  Orientation:  Full (Time, Place, and Person)  Thought Content:  Logical  Suicidal Thoughts:  No  Homicidal Thoughts:  No  Memory:  Immediate;   Good Recent;   Fair Remote;   Fair  Judgement:  Impaired  Insight:  Fair  Psychomotor Activity:  Normal  Concentration:  Concentration: Fair  Recall:  [employer] of Knowledge:  Fair  Language:  Fair  Akathisia:  No  Handed:  Right  AIMS (if indicated):     Assets:  Communication  Skills Desire for Improvement Financial Resources/Insurance Housing Resilience  ADL's:  Intact  Cognition:  Impaired,  Mild  Sleep:        Treatment Plan Summary: Daily contact with patient to assess and evaluate symptoms and progress in treatment, Medication management and Plan Patient is feeling much better today. Denies suicidal thoughts. Seems to be back to his most positive baseline. I suspect just getting this medical issue resolved took a load off of his mind. Patient can be discharged safely home to his outpatient treatment for his mental health and medical issues. Supportive counseling and review of medicines completed. Discontinue the sitter he does not need that anymore. Patient agrees to the plan. Reviewed with nursing.  Disposition:  Supportive therapy provided about ongoing stressors. Discussed crisis plan, support from social network, calling 911, coming to the Emergency Department, and calling Suicide Hotline.  Alethia Berthold, MD 11/20/2016 12:50 PM

## 2016-11-20 NOTE — Discharge Instructions (Signed)

## 2016-11-20 NOTE — Discharge Summary (Signed)
[facility] [place] [facility]   PATIENT NAME: Lance [name]    MR#:  [id number]  DATE OF BIRTH:  Nov 15, 1975  DATE OF ADMISSION:  11/18/2016 ADMITTING PHYSICIAN: Tonye Royalty, DO  DATE OF DISCHARGE: 11/20/2016  PRIMARY CARE PHYSICIAN: Corky Downs, MD    ADMISSION DIAGNOSIS:  Gross hematuria [R31.0] Supratherapeutic INR [R79.1]  DISCHARGE DIAGNOSIS:  Principal Problem:   Schizoaffective disorder, bipolar type (HCC) Active Problems:   Supratherapeutic INR   SECONDARY DIAGNOSIS:   Past Medical History:  Diagnosis Date  . Depression   . Diabetes mellitus without complication (HCC)   . Hyperlipidemia   . Hypertension   . Lupus anticoagulant disorder (HCC) 06/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  . PE (pulmonary thromboembolism) (HCC)   . Schizo affective schizophrenia [facility])     HOSPITAL COURSE:   #. Supratherapeutic INR - Reverse INR with FFP and Vitamin K- now 3 - Pharmacy consult  - CBC q4hours and repeat PT/INR- came down to 1.5, hb stable.  #. Gross hematuria secondary to above - Monitor as INR improves - Stopped, Urine clear now as per pt.  #. Hypokalemia - Replace PO - Recheck BMP in AM- improved.  #. History of Diabetes - Accuchecks with RISS coverage - Heart healthy, carb controlled diet  #. History of schizoaffective, depression - Continue Wellbutrin, Klonopin, Clozaril, Lithium  #. History of HLD - Continue Zocor  # Suicidal tendency   He told nurse today.   Safty sitter and psych consult.   Improved, and psych cleared for discharge.  # Lupus antibody   History of pulmonary embolism   Advised to have lifelong anti-coagulation by Dr. Sherrlyn Hock in Dec 2013.   He is not able to follow his INR at group home as they do not send him to the clinic for checkup, we arranged for visiting nurse on discharge.  DISCHARGE CONDITIONS:   Stable.  CONSULTS OBTAINED:  Treatment Team:  Clapacs, [name],  MD  DRUG ALLERGIES:   Allergies  Allergen Reactions  . Penicillins Other (See Comments)    Reaction: "lockjaw" Has patient had a PCN reaction causing immediate rash, facial/tongue/throat swelling, SOB or lightheadedness with hypotension: No Has patient had a PCN reaction causing severe rash involving mucus membranes or skin necrosis: No Has patient had a PCN reaction that required hospitalization: No Has patient had a PCN reaction occurring within the last 10 years: No If all of the above answers are "NO", then may proceed with Cephalosporin use.     DISCHARGE MEDICATIONS:   Current Discharge Medication List    START taking these medications   Details  mometasone-formoterol (DULERA) 200-5 MCG/ACT AERO Inhale 2 puffs into the lungs 2 (two) times daily. Qty: 1 Inhaler, Refills: 1      CONTINUE these medications which have CHANGED   Details  warfarin (COUMADIN) 3 MG tablet Take 2.5 tablets (7.5 mg total) by mouth daily at 6 PM. Qty: 20 tablet, Refills: 0      CONTINUE these medications which have NOT CHANGED   Details  asenapine (SAPHRIS) 5 MG SUBL 24 hr tablet Place 5 mg under the tongue 3 (three) times daily.    insulin detemir (LEVEMIR) 100 UNIT/ML injection Inject 14 Units into the skin every morning.    loperamide (IMODIUM A-D) 2 MG tablet Take 2 mg by mouth 4 (four) times daily as needed for diarrhea or loose stools.    metformin (FORTAMET) 500 MG (OSM) 24 hr  tablet Take 500 mg by mouth 2 (two) times daily with a meal.    omega-3 acid ethyl esters (LOVAZA) 1 g capsule Take 1 g by mouth daily.    ondansetron (ZOFRAN-ODT) 4 MG disintegrating tablet Take 4 mg by mouth every 8 (eight) hours as needed for nausea or vomiting.    ziprasidone (GEODON) 80 MG capsule Take 80 mg by mouth 2 (two) times daily with a meal.    zolpidem (AMBIEN) 5 MG tablet Take 5 mg by mouth at bedtime as needed for sleep.    buPROPion (WELLBUTRIN XL) 150 MG 24 hr tablet Take 1 tablet (150 mg  total) by mouth daily. Qty: 30 tablet, Refills: 0    cloZAPine (CLOZARIL) 100 MG tablet Take 4.5 tablets (450 mg total) by mouth at bedtime. Qty: 135 tablet, Refills: 0    docusate sodium (COLACE) 100 MG capsule Take 2 capsules (200 mg total) by mouth 2 (two) times daily. Qty: 120 capsule, Refills: 0    ipratropium (ATROVENT) 0.06 % nasal spray Place 1 spray into both nostrils at bedtime. Qty: 3 mL, Refills: 0    lithium carbonate (ESKALITH) 450 MG CR tablet Take 1 tablet (450 mg total) by mouth every 12 (twelve) hours. Qty: 60 tablet, Refills: 0    nicotine (NICOTROL) 10 MG inhaler Inhale 1 Cartridge (1 continuous puffing total) into the lungs as needed for smoking cessation. Qty: 60 each, Refills: 0    polyethylene glycol (MIRALAX / GLYCOLAX) packet Take 17 g by mouth daily. Qty: 30 each, Refills: 0    simvastatin (ZOCOR) 40 MG tablet Take 40 mg by mouth at bedtime.      STOP taking these medications     clonazePAM (KLONOPIN) 0.5 MG tablet      Fluticasone-Salmeterol (ADVAIR) 250-50 MCG/DOSE AEPB          DISCHARGE INSTRUCTIONS:    Follow with PMD and check INR frequently.,  If you experience worsening of your admission symptoms, develop shortness of breath, life threatening emergency, suicidal or homicidal thoughts you must seek medical attention immediately by calling 911 or calling your MD immediately  if symptoms less severe.  You Must read complete instructions/literature along with all the possible adverse reactions/side effects for all the Medicines you take and that have been prescribed to you. Take any new Medicines after you have completely understood and accept all the possible adverse reactions/side effects.   Please note  You were cared for by a hospitalist during your hospital stay. If you have any questions about your discharge medications or the care you received while you were in the hospital after you are discharged, you can call the unit and asked to  speak with the hospitalist on call if the hospitalist that took care of you is not available. Once you are discharged, your primary care physician will handle any further medical issues. Please note that NO REFILLS for any discharge medications will be authorized once you are discharged, as it is imperative that you return to your primary care physician (or establish a relationship with a primary care physician if you do not have one) for your aftercare needs so that they can reassess your need for medications and monitor your lab values.    Today   CHIEF COMPLAINT:   Chief Complaint  Patient presents with  . Hematuria    HISTORY OF PRESENT ILLNESS:  Melinda CrutchJames Mathieson  is a 41 y.o. male with a known history of DM, HTN, HLD< PE, schizoaffective disorder presents  to the emergency department for evaluation of hematuria.  Patient was in a usual state of health until today when he noticed bright red urine.  Has been on Coumadin for many years for PE and has never had any problems.   Patient denies fevers/chills, weakness, dizziness, chest pain, shortness of breath, N/V/C/D, abdominal pain, dysuria/frequency, changes in mental status.    Otherwise there has been no change in status. Patient has been taking medication as prescribed and there has been no recent change in medication or diet.  No recent antibiotics.  There has been no recent illness, hospitalizations, travel or sick contacts.     VITAL SIGNS:  Blood pressure 123/82, pulse 64, temperature 97.8 F (36.6 C), temperature source Axillary, resp. rate 17, height 5' 10.5" (1.791 m), weight 89.1 kg (196 lb 8 oz), SpO2 100 %.  I/O:   Intake/Output Summary (Last 24 hours) at 11/20/16 1325 Last data filed at 11/19/16 1826  Gross per 24 hour  Intake              720 ml  Output                0 ml  Net              720 ml    PHYSICAL EXAMINATION:  GENERAL:  41 y.o.-year-old patient lying in the bed with no acute distress.  EYES: Pupils  equal, round, reactive to light and accommodation. No scleral icterus. Extraocular muscles intact.  HEENT: Head atraumatic, normocephalic. Oropharynx and nasopharynx clear.  NECK:  Supple, no jugular venous distention. No thyroid enlargement, no tenderness.  LUNGS: Normal breath sounds bilaterally, no wheezing, rales,rhonchi or crepitation. No use of accessory muscles of respiration.  CARDIOVASCULAR: S1, S2 normal. No murmurs, rubs, or gallops.  ABDOMEN: Soft, non-tender, non-distended. Bowel sounds present. No organomegaly or mass.  EXTREMITIES: No pedal edema, cyanosis, or clubbing.  NEUROLOGIC: Cranial nerves II through XII are intact. Muscle strength 5/5 in all extremities. Sensation intact. Gait not checked.  PSYCHIATRIC: The patient is alert and oriented x 3.  SKIN: No obvious rash, lesion, or ulcer.   DATA REVIEW:   CBC  Recent Labs Lab 11/20/16 0856  WBC 5.0  HGB 14.9  HCT 43.4  PLT 196    Chemistries   Recent Labs Lab 11/18/16 2102 11/19/16 0244  NA 144 143  K 3.3* 3.4*  CL 112* 112*  CO2 25 25  GLUCOSE 103* 100*  BUN 16 15  CREATININE 1.04 0.92  CALCIUM 9.4 8.9  MG 1.9  --   AST 31  --   ALT 31  --   ALKPHOS 57  --   BILITOT 0.5  --     Cardiac Enzymes No results for input(s): TROPONINI in the last 168 hours.  Microbiology Results  Results for orders placed or performed during the hospital encounter of 11/18/16  Urine culture     Status: Abnormal   Collection Time: 11/18/16  8:30 PM  Result Value Ref Range Status   Specimen Description URINE, CLEAN CATCH  Final   Special Requests NONE  Final   Culture (A)  Final    <10,000 COLONIES/mL INSIGNIFICANT GROWTH Performed at [facility], [address], [place], [place] [address]    Report Status 11/20/2016 FINAL  Final    RADIOLOGY:  No results found.  EKG:   Orders placed or performed during the hospital encounter of 11/18/16  . EKG 12-Lead  Management plans discussed with the  patient, family and they are in agreement.  CODE STATUS:     Code Status Orders        Start     Ordered   11/19/16 0211  Full code  Continuous     11/19/16 0210    Code Status History    Date Active Date Inactive Code Status Order ID Comments User Context   08/31/2016 10:39 PM 09/14/2016  6:29 PM Full Code [id number]  Audery Amel, MD Inpatient   05/21/2016  9:14 PM 05/26/2016  4:20 PM Full Code [id number]  Clapacs, [name], MD Inpatient      TOTAL TIME TAKING CARE OF THIS PATIENT: 35 minutes.    Altamese Dilling M.D on 11/20/2016 at 1:25 PM  Between 7am to 6pm - Pager - [phone]  After 6pm go to [email or web address] - password [email or web address]  [facility]  Office  [phone]  CC: Primary care physician; Corky Downs, MD   Note: This dictation was prepared with Dragon dictation along with smaller phrase technology. Any transcriptional errors that result from this process are unintentional.

## 2016-11-29 ENCOUNTER — Emergency Department
Admission: EM | Admit: 2016-11-29 | Discharge: 2016-12-01 | Disposition: A | Discharge status: Other Acute Inpatient Hospital | Payer: Medicare Other | Attending: Emergency Medicine | Admitting: Emergency Medicine

## 2016-11-29 ENCOUNTER — Encounter: Payer: Self-pay | Admitting: Emergency Medicine

## 2016-11-29 ENCOUNTER — Emergency Department: Payer: Medicare Other

## 2016-11-29 DIAGNOSIS — I10 Essential (primary) hypertension: Secondary | ICD-10-CM | POA: Diagnosis present

## 2016-11-29 DIAGNOSIS — F319 Bipolar disorder, unspecified: Secondary | ICD-10-CM | POA: Diagnosis present

## 2016-11-29 DIAGNOSIS — F17210 Nicotine dependence, cigarettes, uncomplicated: Secondary | ICD-10-CM | POA: Diagnosis not present

## 2016-11-29 DIAGNOSIS — Z79899 Other long term (current) drug therapy: Secondary | ICD-10-CM | POA: Insufficient documentation

## 2016-11-29 DIAGNOSIS — F250 Schizoaffective disorder, bipolar type: Secondary | ICD-10-CM | POA: Diagnosis present

## 2016-11-29 DIAGNOSIS — F209 Schizophrenia, unspecified: Secondary | ICD-10-CM | POA: Diagnosis not present

## 2016-11-29 DIAGNOSIS — E119 Type 2 diabetes mellitus without complications: Secondary | ICD-10-CM

## 2016-11-29 DIAGNOSIS — R404 Transient alteration of awareness: Secondary | ICD-10-CM | POA: Diagnosis not present

## 2016-11-29 DIAGNOSIS — Z7901 Long term (current) use of anticoagulants: Secondary | ICD-10-CM

## 2016-11-29 DIAGNOSIS — R42 Dizziness and giddiness: Secondary | ICD-10-CM | POA: Diagnosis not present

## 2016-11-29 DIAGNOSIS — E785 Hyperlipidemia, unspecified: Secondary | ICD-10-CM | POA: Diagnosis present

## 2016-11-29 DIAGNOSIS — R51 Headache: Secondary | ICD-10-CM | POA: Diagnosis not present

## 2016-11-29 DIAGNOSIS — R112 Nausea with vomiting, unspecified: Secondary | ICD-10-CM | POA: Diagnosis present

## 2016-11-29 DIAGNOSIS — F1210 Cannabis abuse, uncomplicated: Secondary | ICD-10-CM | POA: Insufficient documentation

## 2016-11-29 DIAGNOSIS — F331 Major depressive disorder, recurrent, moderate: Secondary | ICD-10-CM | POA: Insufficient documentation

## 2016-11-29 DIAGNOSIS — F259 Schizoaffective disorder, unspecified: Secondary | ICD-10-CM | POA: Diagnosis present

## 2016-11-29 DIAGNOSIS — Z86711 Personal history of pulmonary embolism: Secondary | ICD-10-CM | POA: Diagnosis present

## 2016-11-29 LAB — COMPREHENSIVE METABOLIC PANEL
ALT: 27 U/L (ref 17–63)
ANION GAP: 7 (ref 5–15)
AST: 27 U/L (ref 15–41)
Albumin: 4.6 g/dL (ref 3.5–5.0)
Alkaline Phosphatase: 59 U/L (ref 38–126)
BILIRUBIN TOTAL: 0.6 mg/dL (ref 0.3–1.2)
BUN: 20 mg/dL (ref 6–20)
CALCIUM: 9.3 mg/dL (ref 8.9–10.3)
CO2: 25 mmol/L (ref 22–32)
Chloride: 107 mmol/L (ref 101–111)
Creatinine, Ser: 1.02 mg/dL (ref 0.61–1.24)
Glucose, Bld: 191 mg/dL — ABNORMAL HIGH (ref 65–99)
Potassium: 3.9 mmol/L (ref 3.5–5.1)
SODIUM: 139 mmol/L (ref 135–145)
TOTAL PROTEIN: 7.1 g/dL (ref 6.5–8.1)

## 2016-11-29 LAB — CBC
HCT: 42.8 % (ref 40.0–52.0)
HEMOGLOBIN: 15.1 g/dL (ref 13.0–18.0)
MCH: 30.9 pg (ref 26.0–34.0)
MCHC: 35.3 g/dL (ref 32.0–36.0)
MCV: 87.6 fL (ref 80.0–100.0)
PLATELETS: 228 10*3/uL (ref 150–440)
RBC: 4.89 MIL/uL (ref 4.40–5.90)
RDW: 13 % (ref 11.5–14.5)
WBC: 12.6 10*3/uL — AB (ref 3.8–10.6)

## 2016-11-29 LAB — ETHANOL: Alcohol, Ethyl (B): <5 mg/dL (ref <5)

## 2016-11-29 LAB — PROTIME-INR
INR: 5.79
PROTHROMBIN TIME: 53.8 s — AB (ref 11.4–15.2)

## 2016-11-29 LAB — LIPASE, BLOOD: Lipase: 32 U/L (ref 11–51)

## 2016-11-29 MED ORDER — SODIUM CHLORIDE 0.9 % IV BOLUS (SEPSIS)
1000.0000 mL | Freq: Once | INTRAVENOUS | Status: AC
  Administered 2016-11-29: 1000 mL via INTRAVENOUS

## 2016-11-29 MED ORDER — ONDANSETRON HCL 4 MG/2ML IJ SOLN
4.0000 mg | Freq: Once | INTRAMUSCULAR | Status: AC
  Administered 2016-11-29: 4 mg via INTRAVENOUS
  Filled 2016-11-29: qty 2

## 2016-11-29 MED ORDER — ONDANSETRON HCL 4 MG/2ML IJ SOLN
4.0000 mg | Freq: Once | INTRAMUSCULAR | Status: AC | PRN
  Administered 2016-11-29: 4 mg via INTRAVENOUS
  Filled 2016-11-29: qty 2

## 2016-11-29 NOTE — ED Triage Notes (Signed)
Pt presents to ED via [facility] with c/o N/V x a couple of hours. Pt presents a 20g IV to L hand. Pt is noted to be diaphoretic on arrival, vomiting on arrival, vomit noted to be bright yellow. Per EMS pt smoke marijuanna approx 1 hr PTA and started feeling ill and that "another reality started coming in on him", EMS reports hx of schizophrenia and pt has been compliant with medications. EMS reports similar episode a few years ago when patient smoke marijuana. C/o HA, denies CP/SHOB.

## 2016-11-29 NOTE — ED Provider Notes (Signed)
[facility] Emergency Department Provider Note   ____________________________________________   First MD Initiated Contact with Patient 11/29/16 2317     (approximate)  I have reviewed the triage vital signs and the nursing notes.   HISTORY  Chief Complaint Nausea and Emesis    HPI Lance [name] is a 41 y.o. male brought to the ED from home via EMS with chief complaint of nausea and vomiting. Patient admits to smoking marijuana approximately one hour prior to arrival and began to feel ill. Denies associated abdominal pain, dysuria or diarrhea. Denies recent fever, chills, chest pain, shortness of breath. Denies recent travel or trauma. Patient has a history of schizophrenia; states he is compliant with medications and denies active SI/HI/AH/VH. Similar episode a few years ago when patient smoked marijuana.   Past Medical History:  Diagnosis Date  . Depression   . Diabetes mellitus without complication (HCC)   . Hyperlipidemia   . Hypertension   . Lupus anticoagulant disorder (HCC) 06/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  . PE (pulmonary thromboembolism) (HCC)   . Schizo affective schizophrenia [facility](HCC)     Patient Active Problem List   Diagnosis Date Noted  . Supratherapeutic INR 11/19/2016  . Tobacco use disorder 09/01/2016  . Schizoaffective disorder, bipolar type (HCC) 05/22/2016  . Diabetes mellitus without complication (HCC) 05/21/2016  . Hyperlipidemia 05/21/2016  . History of pulmonary embolism 05/21/2016  . Chronic anticoagulation 05/21/2016  . Hypertension 09/25/2015    History reviewed. No pertinent surgical history.  Prior to Admission medications   Medication Sig Start Date End Date Taking? Authorizing Provider  asenapine (SAPHRIS) 5 MG SUBL 24 hr tablet Place 5 mg under the tongue 3 (three) times daily.   Yes [provider]  buPROPion (WELLBUTRIN XL) 150 MG 24 hr tablet Take 1 tablet (150 mg total) by mouth  daily. 09/15/16  Yes Jimmy FootmanHernandez-Gonzalez, Andrea, MD  cloZAPine (CLOZARIL) 100 MG tablet Take 4.5 tablets (450 mg total) by mouth at bedtime. 09/14/16  Yes Jimmy FootmanHernandez-Gonzalez, Andrea, MD  docusate sodium (COLACE) 100 MG capsule Take 2 capsules (200 mg total) by mouth 2 (two) times daily. 09/14/16  Yes Jimmy FootmanHernandez-Gonzalez, Andrea, MD  insulin detemir (LEVEMIR) 100 UNIT/ML injection Inject 14 Units into the skin every morning.   Yes [provider]  ipratropium (ATROVENT) 0.06 % nasal spray Place 1 spray into both nostrils at bedtime. 09/14/16  Yes Jimmy FootmanHernandez-Gonzalez, Andrea, MD  lithium carbonate (ESKALITH) 450 MG CR tablet Take 1 tablet (450 mg total) by mouth every 12 (twelve) hours. 09/14/16  Yes Jimmy FootmanHernandez-Gonzalez, Andrea, MD  loperamide (IMODIUM A-D) 2 MG tablet Take 2 mg by mouth 4 (four) times daily as needed for diarrhea or loose stools.   Yes [provider]  metformin (FORTAMET) 500 MG (OSM) 24 hr tablet Take 500 mg by mouth 2 (two) times daily with a meal.   Yes [provider]  mometasone-formoterol (DULERA) 200-5 MCG/ACT AERO Inhale 2 puffs into the lungs 2 (two) times daily. 11/20/16  Yes Altamese DillingVachhani, Vaibhavkumar, MD  omega-3 acid ethyl esters (LOVAZA) 1 g capsule Take 1 g by mouth daily.   Yes [provider]  ondansetron (ZOFRAN-ODT) 4 MG disintegrating tablet Take 4 mg by mouth every 8 (eight) hours as needed for nausea or vomiting.   Yes [provider]  polyethylene glycol (MIRALAX / GLYCOLAX) packet Take 17 g by mouth daily. 09/15/16  Yes Jimmy FootmanHernandez-Gonzalez, Andrea, MD  simvastatin (ZOCOR) 40 MG tablet Take 40 mg  by mouth at bedtime.   Yes [provider]  warfarin (COUMADIN) 3 MG tablet Take 2.5 tablets (7.5 mg total) by mouth daily at 6 PM. 11/20/16  Yes Altamese Dilling, MD  ziprasidone (GEODON) 80 MG capsule Take 80 mg by mouth 2 (two) times daily with a meal.   Yes [provider]  zolpidem (AMBIEN) 5 MG tablet Take 5 mg by mouth  at bedtime as needed for sleep.   Yes [provider]  nicotine (NICOTROL) 10 MG inhaler Inhale 1 Cartridge (1 continuous puffing total) into the lungs as needed for smoking cessation. Patient not taking: Reported on 11/19/2016 09/14/16   Jimmy Footman, MD    Allergies Penicillins  Family History  Problem Relation Age of Onset  . CAD Mother   . CAD Sister     Social History Social History  Substance Use Topics  . Smoking status: Current Every Day Smoker    Packs/day: 1.00    Years: 23.00    Types: Cigarettes  . Smokeless tobacco: Never Used  . Alcohol use No     Comment: occassionally    Review of Systems  Constitutional: No fever/chills. Eyes: No visual changes. ENT: No sore throat. Cardiovascular: Denies chest pain. Respiratory: Denies shortness of breath. Gastrointestinal: No abdominal pain.  Positive for nausea and vomiting.  No diarrhea.  No constipation. Genitourinary: Negative for dysuria. Musculoskeletal: Negative for back pain. Skin: Negative for rash. Neurological: Negative for headaches, focal weakness or numbness. Psychiatric:Negative for SI/HI/AH/VH.   ____________________________________________   PHYSICAL EXAM:  VITAL SIGNS: ED Triage Vitals [11/29/16 2209]  Enc Vitals Group     BP 138/86     Pulse Rate 76     Resp 20     Temp 97.4 F (36.3 C)     Temp Source Oral     SpO2 95 %     Weight 196 lb (88.9 kg)     Height 5\' 10"  (1.778 m)     Head Circumference      Peak Flow      Pain Score 6     Pain Loc      Pain Edu?      Excl. in GC?     Constitutional: Asleep, awakened for exam. Alert and oriented. Disheveled appearing and in no acute distress. Eyes: Conjunctivae are normal. PERRL. EOMI. Head: Atraumatic. Nose: No congestion/rhinnorhea. Mouth/Throat: Mucous membranes are moist.  Oropharynx non-erythematous. Neck: No stridor.  No cervical spine tenderness to palpation. Cardiovascular: Normal rate, regular rhythm.  Grossly normal heart sounds.  Good peripheral circulation. Respiratory: Normal respiratory effort.  No retractions. Lungs CTAB. Gastrointestinal: Soft and nontender to light and deep palpation. No distention. No abdominal bruits. No CVA tenderness. Musculoskeletal: No lower extremity tenderness nor edema.  No joint effusions. Neurologic:  Normal speech and language. No gross focal neurologic deficits are appreciated.  Skin:  Skin is warm, dry and intact. No rash noted. Psychiatric: Mood and affect are flat. Speech and behavior are normal.  ____________________________________________   LABS (all labs ordered are listed, but only abnormal results are displayed)  Labs Reviewed  COMPREHENSIVE METABOLIC PANEL - Abnormal; Notable for the following:       Result Value   Glucose, Bld 191 (*)    All other components within normal limits  CBC - Abnormal; Notable for the following:    WBC 12.6 (*)    All other components within normal limits  URINALYSIS, COMPLETE (UACMP) WITH MICROSCOPIC - Abnormal; Notable for the following:  Color, Urine YELLOW (*)    APPearance CLEAR (*)    Hgb urine dipstick SMALL (*)    Squamous Epithelial / LPF 0-5 (*)    All other components within normal limits  PROTIME-INR - Abnormal; Notable for the following:    Prothrombin Time 53.8 (*)    INR 5.79 (*)    All other components within normal limits  LIPASE, BLOOD  ETHANOL  URINE DRUG SCREEN, QUALITATIVE ([facility] ONLY)   ____________________________________________  EKG  ED ECG REPORT I, SUNG,JADE J, the attending physician, personally viewed and interpreted this ECG.   Date: 11/29/2016  EKG Time: 2217  Rate: 73  Rhythm: normal EKG, normal sinus rhythm  Axis: Normal  Intervals:right bundle branch block  ST&T Change: Nonspecific  ____________________________________________  RADIOLOGY  Ct Head Wo Contrast  Result Date: 11/30/2016 CLINICAL DATA:  Headache and vomiting. EXAM: CT HEAD WITHOUT  CONTRAST TECHNIQUE: Contiguous axial images were obtained from the base of the skull through the vertex without intravenous contrast. COMPARISON:  None. FINDINGS: Brain: No evidence of acute hemorrhage, hydrocephalus, extra-axial collection or mass lesion/mass effect. Study is affected of motion artifact. Questionable areas of hypoattenuation in bilateral occipital lobes. Vascular: No hyperdense vessel or unexpected calcification. Skull: Normal. Negative for fracture or focal lesion. Sinuses/Orbits: No acute finding. Other: None. IMPRESSION: Study affected by motion artifact. Questionable areas of hypoattenuation in bilateral occipital lobes. These may be artifactual, or they may represent areas of age-indeterminate infarctions or edema such as seen in posterior reversal encephalopathy syndrome. Electronically Signed   By: Ted Mcalpine M.D.   On: 11/30/2016 00:01    ____________________________________________   PROCEDURES  Procedure(s) performed: None  Procedures  Critical Care performed: No  ____________________________________________   INITIAL IMPRESSION / ASSESSMENT AND PLAN / ED COURSE  Pertinent labs & imaging results that were available during my care of the patient were reviewed by me and considered in my medical decision making (see chart for details).  41 year old male who presents with nausea and vomiting after smoking marijuana. History of PE on Coumadin. Reportedly complained of headache to EMS. On exam he is drowsy but arousable to voice and commands. Will obtain CT head to evaluate intracranial hemorrhage. Laboratory results unremarkable. Will add EtOH and check INR. Of note, patient requesting to speak with behavioral medicine for behavioral medicine evaluation. Will consult [facility] psychiatry.  Clinical Course as of Nov 30 309  Mon Nov 30, 2016  0309 Patient was evaluated by [facility] psychiatrist Dr. Hermelinda Medicus who recommends admission to inpatient psychiatry service. Patient  will remain voluntarily pending psychiatric disposition. Will hold Coumadin and recheck INR daily. There is no bleeding issue. Patient has tolerated liquids without emesis. He may be transferred to the [facility] pending bed availability to await psychiatric disposition.  [JS]    Clinical Course User Index [JS] Irean Hong, MD     ____________________________________________   FINAL CLINICAL IMPRESSION(S) / ED DIAGNOSES  Final diagnoses:  Non-intractable vomiting with nausea, unspecified vomiting type  Cannabis abuse  Schizophrenia, unspecified type (HCC)  Moderate episode of recurrent major depressive disorder (HCC)      NEW MEDICATIONS STARTED DURING THIS VISIT:  New Prescriptions   No medications on file     Note:  This document was prepared using Dragon voice recognition software and may include unintentional dictation errors.    Irean Hong, MD 11/30/16 [phone]

## 2016-11-30 DIAGNOSIS — F250 Schizoaffective disorder, bipolar type: Secondary | ICD-10-CM

## 2016-11-30 DIAGNOSIS — F331 Major depressive disorder, recurrent, moderate: Secondary | ICD-10-CM | POA: Diagnosis not present

## 2016-11-30 LAB — DIFFERENTIAL
BASOS ABS: 0 10*3/uL (ref 0–0.1)
Basophils Relative: 0 %
Eosinophils Absolute: 0 10*3/uL (ref 0–0.7)
Eosinophils Relative: 0 %
LYMPHS ABS: 2 10*3/uL (ref 1.0–3.6)
Lymphocytes Relative: 16 %
MONO ABS: 1.2 10*3/uL — AB (ref 0.2–1.0)
Monocytes Relative: 9 %
NEUTROS ABS: 9.7 10*3/uL — AB (ref 1.4–6.5)
Neutrophils Relative %: 75 %

## 2016-11-30 LAB — URINE DRUG SCREEN, QUALITATIVE (ARMC ONLY)
Amphetamines, Ur Screen: NOT DETECTED
BARBITURATES, UR SCREEN: NOT DETECTED
Benzodiazepine, Ur Scrn: NOT DETECTED
CANNABINOID 50 NG, UR ~~LOC~~: POSITIVE — AB
COCAINE METABOLITE, UR ~~LOC~~: NOT DETECTED
MDMA (Ecstasy)Ur Screen: NOT DETECTED
Methadone Scn, Ur: NOT DETECTED
OPIATE, UR SCREEN: NOT DETECTED
Phencyclidine (PCP) Ur S: NOT DETECTED
TRICYCLIC, UR SCREEN: NOT DETECTED

## 2016-11-30 LAB — URINALYSIS, COMPLETE (UACMP) WITH MICROSCOPIC
BACTERIA UA: NONE SEEN
BILIRUBIN URINE: NEGATIVE
Glucose, UA: NEGATIVE mg/dL
Ketones, ur: NEGATIVE mg/dL
LEUKOCYTES UA: NEGATIVE
Nitrite: NEGATIVE
PH: 6 (ref 5.0–8.0)
Protein, ur: NEGATIVE mg/dL
SPECIFIC GRAVITY, URINE: 1.016 (ref 1.005–1.030)

## 2016-11-30 LAB — LITHIUM LEVEL: Lithium Lvl: 0.76 mmol/L (ref 0.60–1.20)

## 2016-11-30 MED ORDER — IPRATROPIUM BROMIDE 0.06 % NA SOLN
1.0000 | Freq: Every day | NASAL | Status: DC
  Administered 2016-11-30: 1 via NASAL
  Filled 2016-11-30 (×2): qty 15

## 2016-11-30 MED ORDER — BUPROPION HCL ER (XL) 150 MG PO TB24
150.0000 mg | ORAL_TABLET | Freq: Every day | ORAL | Status: DC
  Administered 2016-11-30: 150 mg via ORAL
  Filled 2016-11-30: qty 1

## 2016-11-30 MED ORDER — METFORMIN HCL ER 500 MG PO TB24
500.0000 mg | ORAL_TABLET | Freq: Two times a day (BID) | ORAL | Status: DC
  Administered 2016-11-30 (×2): 500 mg via ORAL
  Filled 2016-11-30 (×2): qty 1

## 2016-11-30 MED ORDER — TRAZODONE HCL 100 MG PO TABS
100.0000 mg | ORAL_TABLET | Freq: Once | ORAL | Status: AC
  Administered 2016-12-01: 100 mg via ORAL
  Filled 2016-11-30: qty 1

## 2016-11-30 MED ORDER — MOMETASONE FURO-FORMOTEROL FUM 200-5 MCG/ACT IN AERO
2.0000 | INHALATION_SPRAY | Freq: Two times a day (BID) | RESPIRATORY_TRACT | Status: DC
  Administered 2016-11-30 (×2): 2 via RESPIRATORY_TRACT
  Filled 2016-11-30 (×2): qty 8.8

## 2016-11-30 MED ORDER — ASENAPINE MALEATE 5 MG SL SUBL
5.0000 mg | SUBLINGUAL_TABLET | Freq: Three times a day (TID) | SUBLINGUAL | Status: DC
  Administered 2016-11-30 (×3): 5 mg via SUBLINGUAL
  Filled 2016-11-30 (×5): qty 1

## 2016-11-30 MED ORDER — ACETAMINOPHEN 500 MG PO TABS
1000.0000 mg | ORAL_TABLET | Freq: Once | ORAL | Status: AC
Start: 2016-11-30 — End: 2016-11-30
  Administered 2016-11-30: 1000 mg via ORAL
  Filled 2016-11-30: qty 2

## 2016-11-30 MED ORDER — INSULIN DETEMIR 100 UNIT/ML ~~LOC~~ SOLN
14.0000 [IU] | SUBCUTANEOUS | Status: DC
  Administered 2016-11-30: 14 [IU] via SUBCUTANEOUS
  Filled 2016-11-30 (×2): qty 0.14

## 2016-11-30 MED ORDER — SIMVASTATIN 40 MG PO TABS
40.0000 mg | ORAL_TABLET | Freq: Every day | ORAL | Status: DC
  Administered 2016-11-30: 40 mg via ORAL
  Filled 2016-11-30: qty 1

## 2016-11-30 MED ORDER — ZIPRASIDONE HCL 20 MG PO CAPS
80.0000 mg | ORAL_CAPSULE | Freq: Two times a day (BID) | ORAL | Status: DC
  Administered 2016-11-30 (×2): 80 mg via ORAL
  Filled 2016-11-30 (×2): qty 1

## 2016-11-30 MED ORDER — OMEGA-3-ACID ETHYL ESTERS 1 G PO CAPS
1.0000 g | ORAL_CAPSULE | Freq: Every day | ORAL | Status: DC
  Administered 2016-11-30: 1 g via ORAL
  Filled 2016-11-30 (×2): qty 1

## 2016-11-30 MED ORDER — CLOZAPINE 100 MG PO TABS
450.0000 mg | ORAL_TABLET | Freq: Every day | ORAL | Status: DC
  Administered 2016-11-30: 450 mg via ORAL
  Filled 2016-11-30 (×2): qty 5

## 2016-11-30 MED ORDER — LITHIUM CARBONATE ER 450 MG PO TBCR
450.0000 mg | EXTENDED_RELEASE_TABLET | Freq: Two times a day (BID) | ORAL | Status: DC
  Administered 2016-11-30 (×2): 450 mg via ORAL
  Filled 2016-11-30 (×3): qty 1

## 2016-11-30 NOTE — ED Notes (Addendum)
BEHAVIORAL HEALTH ROUNDING Patient sleeping: No. Patient alert and oriented: yes Behavior appropriate: Yes.  ; If no, describe:  Nutrition and fluids offered: yes Toileting and hygiene offered: Yes  Sitter present: q15 minute observations and security monitoring Law enforcement present: Yes  ODS  Consult pending   

## 2016-11-30 NOTE — ED Notes (Signed)
[facility]. Pt wanded and oriented to unit. Pt stable at this time

## 2016-11-30 NOTE — ED Notes (Signed)
Report given to Harriett RushLou Anne RN

## 2016-11-30 NOTE — ED Notes (Signed)
He is lying in the hallway bed  - NAD observed  No verbalized needs or concerns at this time

## 2016-11-30 NOTE — ED Notes (Signed)
Patient in bathroom

## 2016-11-30 NOTE — ED Notes (Signed)
Report from rebecca, rn.  

## 2016-11-30 NOTE — ED Notes (Addendum)
ED  Is the patient under IVC or is there intent for IVC: no Is the patient medically cleared: Yes.   Is there vacancy in the ED BHU: Yes.   Is the population mix appropriate for patient: Yes.   Is the patient awaiting placement in inpatient or outpatient setting[facility]  Has the patient had a psychiatric consult: Yes.   Survey of unit performed for contraband, proper placement and condition of furniture, tampering with fixtures in bathroom, shower, and each patient room: Yes.  ; Findings:  APPEARANCE/BEHAVIOR Calm and cooperative NEURO ASSESSMENT Orientation: oriented x3  Denies pain Hallucinations: No - denies upon assessment (Hallucinations) Speech: Normal Gait: normal RESPIRATORY ASSESSMENT Even  Unlabored respirations  CARDIOVASCULAR ASSESSMENT Pulses equal   regular rate  Skin warm and dry   GASTROINTESTINAL ASSESSMENT no GI complaint EXTREMITIES Full ROM  PLAN OF CARE Provide calm/safe environment. Vital signs assessed twice daily. ED BHU Assessment once each 12-hour shift. Collaborate with TTS daily or as condition indicates. Assure the ED provider has rounded once each shift. Provide and encourage hygiene. Provide redirection as needed. Assess for escalating behavior; address immediately and inform ED provider.  Assess family dynamic and appropriateness for visitation as needed: Yes.  ; If necessary, describe findings:  Educate the patient/family about BHU procedures/visitation: Yes.  ; If necessary, describe findings:

## 2016-11-30 NOTE — Progress Notes (Signed)
Clozapine monitoring  6/17 ANC 9.7. Submitted to [facility]. Pt is elligible per website.  Fulton ReekMatt Lemmie Vanlanen, PharmD, BCPS  11/30/16 3:46 AM

## 2016-11-30 NOTE — ED Notes (Signed)
Pt asked for urinal in order to urinate. Pt unable to urinate at this time.

## 2016-11-30 NOTE — ED Notes (Signed)
Pt declines offer for blanket or po fluids.

## 2016-11-30 NOTE — ED Notes (Signed)
BEHAVIORAL HEALTH ROUNDING Patient sleeping: No. Patient alert and oriented: yes Behavior appropriate: Yes.  ; If no, describe:  Nutrition and fluids offered: yes Toileting and hygiene offered: Yes  Sitter present: q15 minute observations and security  ENVIRONMENTAL ASSESSMENT Potentially harmful objects out of patient reach: Yes.   Personal belongings secured: Yes.   Patient dressed in hospital provided attire only: Yes.   Plastic bags out of patient reach: Yes.   Patient care equipment (cords, cables, call bells, lines, and drains) shortened, removed, or accounted for: Yes.   Equipment and supplies removed from bottom of stretcher: Yes.   Potentially toxic materials out of patient reach: Yes.   Sharps container removed or out of patient reach: Yes.   monitoring Law enforcement present: Yes  ODS  

## 2016-11-30 NOTE — ED Notes (Signed)
Patient in shower 

## 2016-11-30 NOTE — ED Notes (Signed)
Report to amy, rn

## 2016-11-30 NOTE — ED Notes (Signed)
BEHAVIORAL HEALTH ROUNDING Patient sleeping: No. Patient alert and oriented: yes Behavior appropriate: Yes.  ; If no, describe:  Nutrition and fluids offered: yes Toileting and hygiene offered: Yes  Sitter present: q15 minute observations and security  monitoring Law enforcement present: Yes  ODS  

## 2016-11-30 NOTE — Progress Notes (Signed)
Clozapine monitoring  6/17 ANC 9.7. Submitted to [facility]. Pt is eligible with monitoring every 4 weeks per website.  Fulton ReekMatt Lucile Hillmann, PharmD, BCPS  11/30/16 3:46 AM

## 2016-11-30 NOTE — ED Notes (Signed)
Pt sleeping in room when this RN [facility]. Pt resting in NAD at this time.

## 2016-11-30 NOTE — ED Notes (Signed)
Am meds administered as ordered  He denies pain  Assessment completed  Psych consult pending  Continue to monitor

## 2016-11-30 NOTE — ED Notes (Signed)
Patient was given graham crackers and ginger ale

## 2016-11-30 NOTE — ED Provider Notes (Signed)
-----------------------------------------   4:47 PM on 11/30/2016 -----------------------------------------   Blood pressure 126/80, pulse 62, temperature 98 F (36.7 C), temperature source Oral, resp. rate 18, height 1.778 m (5\' 10" ), weight 88.9 kg (196 lb), SpO2 97 %.  The patient had no acute events since last update.  Calm and cooperative at this time.  Dr. Toni Amendlapacs will admit the patient to the behavioral health unit; I was updated by Dr. Toni Amendlapacs in person.   Loleta RoseForbach, Mileidy Atkin, MD 11/30/16 [phone]

## 2016-11-30 NOTE — Consult Note (Addendum)
[facility] Consult   Reason for Consult:  Consult for 41 year old man with a history of schizophrenia brought in for physical complaints but then complaining of suicidal thoughts Referring Physician:  Karma Greaser Patient Identification: Lance [name] MRN:  [id number] Principal Diagnosis: Schizoaffective disorder, bipolar type [facility]) Diagnosis:   Patient Active Problem List   Diagnosis Date Noted  . Supratherapeutic INR [R79.1] 11/19/2016  . Tobacco use disorder [F17.200] 09/01/2016  . Schizoaffective disorder, bipolar type ([place]) [F25.0] 05/22/2016  . Diabetes mellitus without complication ([place]) [J49.7] 05/21/2016  . Hyperlipidemia [E78.5] 05/21/2016  . History of pulmonary embolism [Z86.711] 05/21/2016  . Chronic anticoagulation [Z79.01] 05/21/2016  . Hypertension [I10] 09/25/2015    Total Time spent with patient: 1 hour  Subjective:   Lance [name] is a 41 y.o. male patient admitted with "I'm not doing so good".  HPI:  Patient interviewed chart reviewed. Patient very familiar to me from multiple prior encounters. 41 year old man with schizophrenia. Came into the emergency room with some physical complaints but then started talking about being suicidal. He tells me that he smokes some marijuana this weekend for the first time in months. It had a severe effect on him. He describes hours of hallucinations and out of body experiences and what sounds more like an LSD trip then just marijuana. It was a very unpleasant thing he got really upset and now is having suicidal thoughts. He says that his thoughts are crazy and running all over the place. Tells me multiple times "I don't feel safe". He is still taking his medication as regularly. He also talks about some of the same things we discussed last week about his general enoui concerning his life.  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 High blood pressure. Her [place] substance abuse history: History of abuse of marijuana which she used to be able to do fairly routinely but it sounds like he's gotten out of the habit and had a really bad experience the other day.  Social history: Really doesn't have much contact with family. Lives in a group home. Has a guardian. He is aware of his having a pretty constricted life  Past Psychiatric History: Long-standing history of schizophrenia or schizoaffective disorder. On clozapine. Multiple medications. History of suicide attempts in the past. Some distant history of aggression although he has not been aggressive or threatening anytime recently that I know of. Generally cooperative with medicine at this point  Risk to Self: Is patient at risk for suicide?: No Risk to Others:   Prior Inpatient Therapy:   Prior Outpatient Therapy:    Past Medical History:  Past Medical History:  Diagnosis Date  . Depression   . Diabetes mellitus without complication ([place])   . Hyperlipidemia   . Hypertension   . Lupus anticoagulant disorder ([place]) 06/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  . PE (pulmonary thromboembolism) ([place])   . Schizo affective schizophrenia ([place])    History reviewed. No pertinent surgical history. Family History:  Family History  Problem Relation Age of Onset  . CAD Mother   . CAD Sister    Family Psychiatric  History: Nonidentified Social History:  History  Alcohol Use No    Comment: occassionally     History  Drug Use  . Types: Marijuana    Comment: Last use 11/29/16    Social History   Social History  . Marital status: Single  Spouse name: N/A  . Number of children: N/A  . Years of education: N/A   Social History Main Topics  . Smoking status: Current Every Day Smoker    Packs/day: 1.00    Years: 23.00    Types: Cigarettes  . Smokeless tobacco: Never Used  . Alcohol use No     Comment: occassionally  . Drug use: Yes     Types: Marijuana     Comment: Last use 11/29/16  . Sexual activity: No     Comment: occasional marijuana- none recently   Other Topics Concern  . None   Social History Narrative   From a group home in [place]   Additional Social History:    Allergies:   Allergies  Allergen Reactions  . Penicillins Other (See Comments)    Reaction: "lockjaw" Has patient had a PCN reaction causing immediate rash, facial/tongue/throat swelling, SOB or lightheadedness with hypotension: No Has patient had a PCN reaction causing severe rash involving mucus membranes or skin necrosis: No Has patient had a PCN reaction that required hospitalization: No Has patient had a PCN reaction occurring within the last 10 years: No If all of the above answers are "NO", then may proceed with Cephalosporin use.     Labs:  Results for orders placed or performed during the hospital encounter of 11/29/16 (from the past 48 hour(s))  Lipase, blood     Status: None   Collection Time: 11/29/16 10:09 PM  Result Value Ref Range   Lipase 32 11 - 51 U/L  Comprehensive metabolic panel     Status: Abnormal   Collection Time: 11/29/16 10:09 PM  Result Value Ref Range   Sodium 139 135 - 145 mmol/L   Potassium 3.9 3.5 - 5.1 mmol/L   Chloride 107 101 - 111 mmol/L   CO2 25 22 - 32 mmol/L   Glucose, Bld 191 (H) 65 - 99 mg/dL   BUN 20 6 - 20 mg/dL   Creatinine, Ser 1.02 0.61 - 1.24 mg/dL   Calcium 9.3 8.9 - 10.3 mg/dL   Total Protein 7.1 6.5 - 8.1 g/dL   Albumin 4.6 3.5 - 5.0 g/dL   AST 27 15 - 41 U/L   ALT 27 17 - 63 U/L   Alkaline Phosphatase 59 38 - 126 U/L   Total Bilirubin 0.6 0.3 - 1.2 mg/dL   GFR calc non Af Amer >60 >60 mL/min   GFR calc Af Amer >60 >60 mL/min    Comment: (NOTE) The eGFR has been calculated using the CKD EPI equation. This calculation has not been validated in all clinical situations. eGFR's persistently <60 mL/min signify possible Chronic Kidney Disease.    Anion gap 7 5 - 15  CBC      Status: Abnormal   Collection Time: 11/29/16 10:09 PM  Result Value Ref Range   WBC 12.6 (H) 3.8 - 10.6 K/uL   RBC 4.89 4.40 - 5.90 MIL/uL   Hemoglobin 15.1 13.0 - 18.0 g/dL   HCT 42.8 40.0 - 52.0 %   MCV 87.6 80.0 - 100.0 fL   MCH 30.9 26.0 - 34.0 pg   MCHC 35.3 32.0 - 36.0 g/dL   RDW 13.0 11.5 - 14.5 %   Platelets 228 150 - 440 K/uL  Ethanol     Status: None   Collection Time: 11/29/16 10:09 PM  Result Value Ref Range   Alcohol, Ethyl (B) <5 <5 mg/dL    Comment:        LOWEST DETECTABLE  LIMIT FOR SERUM ALCOHOL IS 5 mg/dL FOR MEDICAL PURPOSES ONLY   Protime-INR     Status: Abnormal   Collection Time: 11/29/16 10:09 PM  Result Value Ref Range   Prothrombin Time 53.8 (H) 11.4 - 15.2 seconds   INR 5.79 (HH)     Comment: CRITICAL RESULT CALLED TO, READ BACK BY AND VERIFIED WITH: REBECCA LYNN AT 2353ON 11/29/16 RWW   Lithium level     Status: None   Collection Time: 11/29/16 10:09 PM  Result Value Ref Range   Lithium Lvl 0.76 0.60 - 1.20 mmol/L  Differential     Status: Abnormal   Collection Time: 11/29/16 10:09 PM  Result Value Ref Range   Neutrophils Relative % 75 %   Neutro Abs 9.7 (H) 1.4 - 6.5 K/uL   Lymphocytes Relative 16 %   Lymphs Abs 2.0 1.0 - 3.6 K/uL   Monocytes Relative 9 %   Monocytes Absolute 1.2 (H) 0.2 - 1.0 K/uL   Eosinophils Relative 0 %   Eosinophils Absolute 0.0 0 - 0.7 K/uL   Basophils Relative 0 %   Basophils Absolute 0.0 0 - 0.1 K/uL  Urinalysis, Complete w Microscopic     Status: Abnormal   Collection Time: 11/30/16  1:06 AM  Result Value Ref Range   Color, Urine YELLOW (A) YELLOW   APPearance CLEAR (A) CLEAR   Specific Gravity, Urine 1.016 1.005 - 1.030   pH 6.0 5.0 - 8.0   Glucose, UA NEGATIVE NEGATIVE mg/dL   Hgb urine dipstick SMALL (A) NEGATIVE   Bilirubin Urine NEGATIVE NEGATIVE   Ketones, ur NEGATIVE NEGATIVE mg/dL   Protein, ur NEGATIVE NEGATIVE mg/dL   Nitrite NEGATIVE NEGATIVE   Leukocytes, UA NEGATIVE NEGATIVE   RBC / HPF 6-30  0 - 5 RBC/hpf   WBC, UA 0-5 0 - 5 WBC/hpf   Bacteria, UA NONE SEEN NONE SEEN   Squamous Epithelial / LPF 0-5 (A) NONE SEEN   Mucous PRESENT   Urine Drug Screen, Qualitative ([facility] only)     Status: Abnormal   Collection Time: 11/30/16  1:06 AM  Result Value Ref Range   Tricyclic, Ur Screen NONE DETECTED NONE DETECTED   Amphetamines, Ur Screen NONE DETECTED NONE DETECTED   MDMA (Ecstasy)Ur Screen NONE DETECTED NONE DETECTED   Cocaine Metabolite,Ur [place] NONE DETECTED NONE DETECTED   Opiate, Ur Screen NONE DETECTED NONE DETECTED   Phencyclidine (PCP) Ur S NONE DETECTED NONE DETECTED   Cannabinoid 50 Ng, Ur [place] POSITIVE (A) NONE DETECTED   Barbiturates, Ur Screen NONE DETECTED NONE DETECTED   Benzodiazepine, Ur Scrn NONE DETECTED NONE DETECTED   Methadone Scn, Ur NONE DETECTED NONE DETECTED    Comment: (NOTE) 017  Tricyclics, urine               Cutoff 1000 ng/mL 200  Amphetamines, urine             Cutoff 1000 ng/mL 300  MDMA (Ecstasy), urine           Cutoff 500 ng/mL 400  Cocaine Metabolite, urine       Cutoff 300 ng/mL 500  Opiate, urine                   Cutoff 300 ng/mL 600  Phencyclidine (PCP), urine      Cutoff 25 ng/mL 700  Cannabinoid, urine              Cutoff 50 ng/mL 800  Barbiturates, urine  Cutoff 200 ng/mL 900  Benzodiazepine, urine           Cutoff 200 ng/mL 1000 Methadone, urine                Cutoff 300 ng/mL 1100 1200 The urine drug screen provides only a preliminary, unconfirmed 1300 analytical test result and should not be used for non-medical 1400 purposes. Clinical consideration and professional judgment should 1500 be applied to any positive drug screen result due to possible 1600 interfering substances. A more specific alternate chemical method 1700 must be used in order to obtain a confirmed analytical result.  1800 Gas chromato graphy / mass spectrometry (GC/MS) is the preferred 1900 confirmatory method.     Current Facility-Administered  Medications  Medication Dose Route Frequency Provider Last Rate Last Dose  . asenapine (SAPHRIS) sublingual tablet 5 mg  5 mg Sublingual TID Paulette Blanch, MD   5 mg at 11/30/16 1758  . buPROPion (WELLBUTRIN XL) 24 hr tablet 150 mg  150 mg Oral Daily Paulette Blanch, MD   150 mg at 11/30/16 0920  . cloZAPine (CLOZARIL) tablet 450 mg  450 mg Oral QHS Paulette Blanch, MD      . insulin detemir (LEVEMIR) injection 14 Units  14 Units Subcutaneous Geanie Kenning, MD   14 Units at 11/30/16 [phone]  . ipratropium (ATROVENT) 0.06 % nasal spray 1 spray  1 spray Each Nare QHS Paulette Blanch, MD      . lithium carbonate (ESKALITH) CR tablet 450 mg  450 mg Oral Q12H Paulette Blanch, MD   450 mg at 11/30/16 3299  . metFORMIN (GLUCOPHAGE-XR) 24 hr tablet 500 mg  500 mg Oral BID WC Paulette Blanch, MD   500 mg at 11/30/16 1759  . mometasone-formoterol (DULERA) 200-5 MCG/ACT inhaler 2 puff  2 puff Inhalation BID Paulette Blanch, MD   2 puff at 11/30/16 0926  . omega-3 acid ethyl esters (LOVAZA) capsule 1 g  1 g Oral Daily Paulette Blanch, MD   1 g at 11/30/16 2426  . simvastatin (ZOCOR) tablet 40 mg  40 mg Oral QHS Paulette Blanch, MD      . ziprasidone (GEODON) capsule 80 mg  80 mg Oral BID WC Paulette Blanch, MD   80 mg at 11/30/16 1759   Current Outpatient Prescriptions  Medication Sig Dispense Refill  . asenapine (SAPHRIS) 5 MG SUBL 24 hr tablet Place 5 mg under the tongue 3 (three) times daily.    Marland Kitchen buPROPion (WELLBUTRIN XL) 150 MG 24 hr tablet Take 1 tablet (150 mg total) by mouth daily. 30 tablet 0  . cloZAPine (CLOZARIL) 100 MG tablet Take 4.5 tablets (450 mg total) by mouth at bedtime. 135 tablet 0  . docusate sodium (COLACE) 100 MG capsule Take 2 capsules (200 mg total) by mouth 2 (two) times daily. 120 capsule 0  . insulin detemir (LEVEMIR) 100 UNIT/ML injection Inject 14 Units into the skin every morning.    Marland Kitchen ipratropium (ATROVENT) 0.06 % nasal spray Place 1 spray into both nostrils at bedtime. 3 mL 0  . lithium carbonate  (ESKALITH) 450 MG CR tablet Take 1 tablet (450 mg total) by mouth every 12 (twelve) hours. 60 tablet 0  . loperamide (IMODIUM A-D) 2 MG tablet Take 2 mg by mouth 4 (four) times daily as needed for diarrhea or loose stools.    . metformin (FORTAMET) 500 MG (OSM) 24 hr tablet Take 500 mg by  mouth 2 (two) times daily with a meal.    . mometasone-formoterol (DULERA) 200-5 MCG/ACT AERO Inhale 2 puffs into the lungs 2 (two) times daily. 1 Inhaler 1  . omega-3 acid ethyl esters (LOVAZA) 1 g capsule Take 1 g by mouth daily.    . ondansetron (ZOFRAN-ODT) 4 MG disintegrating tablet Take 4 mg by mouth every 8 (eight) hours as needed for nausea or vomiting.    . polyethylene glycol (MIRALAX / GLYCOLAX) packet Take 17 g by mouth daily. 30 each 0  . simvastatin (ZOCOR) 40 MG tablet Take 40 mg by mouth at bedtime.    Marland Kitchen warfarin (COUMADIN) 3 MG tablet Take 2.5 tablets (7.5 mg total) by mouth daily at 6 PM. 20 tablet 0  . ziprasidone (GEODON) 80 MG capsule Take 80 mg by mouth 2 (two) times daily with a meal.    . zolpidem (AMBIEN) 5 MG tablet Take 5 mg by mouth at bedtime as needed for sleep.    . nicotine (NICOTROL) 10 MG inhaler Inhale 1 Cartridge (1 continuous puffing total) into the lungs as needed for smoking cessation. (Patient not taking: Reported on 11/19/2016) 60 each 0    Musculoskeletal: Strength & Muscle Tone: within normal limits Gait & Station: normal Patient leans: N/A  Psychiatric Specialty Exam: Physical Exam  Nursing note and vitals reviewed. Constitutional: He appears well-developed and well-nourished.  HENT:  Head: Normocephalic and atraumatic.  Eyes: Conjunctivae are normal. Pupils are equal, round, and reactive to light.  Neck: Normal range of motion.  Cardiovascular: Regular rhythm and normal heart sounds.   Respiratory: Effort normal. No respiratory distress.  GI: Soft.  Musculoskeletal: Normal range of motion.  Neurological: He is alert.  Skin: Skin is warm and dry.   Psychiatric: His speech is tangential. He is slowed. Thought content is paranoid and delusional. Cognition and memory are impaired. He expresses impulsivity and inappropriate judgment. He exhibits a depressed mood. He expresses suicidal ideation. He exhibits abnormal recent memory.    Review of Systems  Constitutional: Negative.   HENT: Negative.   Eyes: Negative.   Respiratory: Negative.   Cardiovascular: Negative.   Gastrointestinal: Negative.   Musculoskeletal: Negative.   Skin: Negative.   Neurological: Negative.   Psychiatric/Behavioral: Positive for depression, hallucinations, memory loss, substance abuse and suicidal ideas. The patient is nervous/anxious and has insomnia.     Blood pressure 126/80, pulse 62, temperature 98 F (36.7 C), temperature source Oral, resp. rate 18, height '5\' 10"'  (1.778 m), weight 88.9 kg (196 lb), SpO2 97 %.Body mass index is 28.12 kg/m.  General Appearance: Disheveled  Eye Contact:  Good  Speech:  Slow  Volume:  Decreased  Mood:  Dysphoric  Affect:  Constricted  Thought Process:  Goal Directed  Orientation:  Full (Time, Place, and Person)  Thought Content:  Hallucinations: Auditory and Tangential  Suicidal Thoughts:  Yes.  with intent/plan  Homicidal Thoughts:  No  Memory:  Immediate;   Fair Recent;   Fair Remote;   Fair  Judgement:  Fair  Insight:  Lacking  Psychomotor Activity:  Decreased  Concentration:  Concentration: Fair  Recall:  [employer] of Knowledge:  Fair  Language:  Fair  Akathisia:  No  Handed:  Right  AIMS (if indicated):     Assets:  Desire for Improvement Housing Resilience  ADL's:  Intact  Cognition:  Impaired,  Mild  Sleep:        Treatment Plan Summary: Daily contact with patient to assess and evaluate  symptoms and progress in treatment, Medication management and Plan 41 year old man with schizophrenia who is reporting a worsening of his psychiatric symptoms. I saw him a week or 2 ago in the hospital and was  aware that things were sort of going downhill for him but it seems like things are just getting worse. Patient is voicing suicidal ideation. Saying his paranoia and psychosis are worse. He will be admitted to the psychiatric ward. Labs can be obtained. 15 minute checks in place. Case reviewed with emergency room physician and TTS.   Patient did come into the emergency room yesterday with an elevated prothrombin time with an INR greater than 5. Currently his anticoagulant is being held. We will make sure he gets PTs requested. Recommend that he have a medicine consult for follow-up of this.  Disposition: Recommend psychiatric Inpatient admission when medically cleared. Supportive therapy provided about ongoing stressors.  Alethia Berthold, MD 11/30/2016 6:11 PM

## 2016-12-01 ENCOUNTER — Inpatient Hospital Stay
Admission: EM | Admit: 2016-12-01 | Discharge: 2016-12-03 | DRG: 885 | Disposition: A | Discharge status: Home / Self Care | Payer: Medicare Other | Source: Intra-hospital | Attending: Psychiatry | Admitting: Psychiatry

## 2016-12-01 DIAGNOSIS — J449 Chronic obstructive pulmonary disease, unspecified: Secondary | ICD-10-CM | POA: Diagnosis present

## 2016-12-01 DIAGNOSIS — Z880 Allergy status to penicillin: Secondary | ICD-10-CM

## 2016-12-01 DIAGNOSIS — F1290 Cannabis use, unspecified, uncomplicated: Secondary | ICD-10-CM | POA: Diagnosis present

## 2016-12-01 DIAGNOSIS — F259 Schizoaffective disorder, unspecified: Secondary | ICD-10-CM | POA: Diagnosis present

## 2016-12-01 DIAGNOSIS — F17200 Nicotine dependence, unspecified, uncomplicated: Secondary | ICD-10-CM | POA: Diagnosis present

## 2016-12-01 DIAGNOSIS — D6862 Lupus anticoagulant syndrome: Secondary | ICD-10-CM | POA: Diagnosis present

## 2016-12-01 DIAGNOSIS — F250 Schizoaffective disorder, bipolar type: Secondary | ICD-10-CM | POA: Diagnosis not present

## 2016-12-01 DIAGNOSIS — Z86711 Personal history of pulmonary embolism: Secondary | ICD-10-CM | POA: Diagnosis not present

## 2016-12-01 DIAGNOSIS — I10 Essential (primary) hypertension: Secondary | ICD-10-CM | POA: Diagnosis present

## 2016-12-01 DIAGNOSIS — Z794 Long term (current) use of insulin: Secondary | ICD-10-CM

## 2016-12-01 DIAGNOSIS — Z7901 Long term (current) use of anticoagulants: Secondary | ICD-10-CM | POA: Diagnosis not present

## 2016-12-01 DIAGNOSIS — Z79899 Other long term (current) drug therapy: Secondary | ICD-10-CM | POA: Diagnosis not present

## 2016-12-01 DIAGNOSIS — E785 Hyperlipidemia, unspecified: Secondary | ICD-10-CM | POA: Diagnosis present

## 2016-12-01 DIAGNOSIS — E119 Type 2 diabetes mellitus without complications: Secondary | ICD-10-CM | POA: Diagnosis present

## 2016-12-01 DIAGNOSIS — F331 Major depressive disorder, recurrent, moderate: Secondary | ICD-10-CM | POA: Diagnosis not present

## 2016-12-01 DIAGNOSIS — F17210 Nicotine dependence, cigarettes, uncomplicated: Secondary | ICD-10-CM | POA: Diagnosis present

## 2016-12-01 DIAGNOSIS — G4700 Insomnia, unspecified: Secondary | ICD-10-CM | POA: Diagnosis present

## 2016-12-01 DIAGNOSIS — Z818 Family history of other mental and behavioral disorders: Secondary | ICD-10-CM | POA: Diagnosis not present

## 2016-12-01 DIAGNOSIS — F319 Bipolar disorder, unspecified: Secondary | ICD-10-CM | POA: Diagnosis present

## 2016-12-01 DIAGNOSIS — F909 Attention-deficit hyperactivity disorder, unspecified type: Secondary | ICD-10-CM | POA: Diagnosis present

## 2016-12-01 HISTORY — DX: Abnormal coagulation profile: R79.1

## 2016-12-01 LAB — CBC WITH DIFFERENTIAL/PLATELET
Basophils Absolute: 0 10*3/uL (ref 0–0.1)
Basophils Relative: 0 %
EOS ABS: 0 10*3/uL (ref 0–0.7)
Eosinophils Relative: 0 %
HEMATOCRIT: 42.1 % (ref 40.0–52.0)
HEMOGLOBIN: 14.6 g/dL (ref 13.0–18.0)
LYMPHS ABS: 1.3 10*3/uL (ref 1.0–3.6)
LYMPHS PCT: 29 %
MCH: 31.3 pg (ref 26.0–34.0)
MCHC: 34.7 g/dL (ref 32.0–36.0)
MCV: 90 fL (ref 80.0–100.0)
Monocytes Absolute: 0.5 10*3/uL (ref 0.2–1.0)
Monocytes Relative: 12 %
NEUTROS ABS: 2.6 10*3/uL (ref 1.4–6.5)
NEUTROS PCT: 59 %
Platelets: 183 10*3/uL (ref 150–440)
RBC: 4.68 MIL/uL (ref 4.40–5.90)
RDW: 13.5 % (ref 11.5–14.5)
WBC: 4.5 10*3/uL (ref 3.8–10.6)

## 2016-12-01 LAB — PROTIME-INR
INR: 3.42
PROTHROMBIN TIME: 35.3 s — AB (ref 11.4–15.2)

## 2016-12-01 LAB — GLUCOSE, CAPILLARY
GLUCOSE-CAPILLARY: 89 mg/dL (ref 65–99)
GLUCOSE-CAPILLARY: 92 mg/dL (ref 65–99)
Glucose-Capillary: 114 mg/dL — ABNORMAL HIGH (ref 65–99)

## 2016-12-01 MED ORDER — SIMVASTATIN 40 MG PO TABS
40.0000 mg | ORAL_TABLET | Freq: Every day | ORAL | Status: DC
  Administered 2016-12-01 – 2016-12-02 (×2): 40 mg via ORAL
  Filled 2016-12-01 (×2): qty 1

## 2016-12-01 MED ORDER — NICOTINE 21 MG/24HR TD PT24
21.0000 mg | MEDICATED_PATCH | Freq: Every day | TRANSDERMAL | Status: DC
  Administered 2016-12-01 – 2016-12-02 (×2): 21 mg via TRANSDERMAL
  Filled 2016-12-01 (×3): qty 1

## 2016-12-01 MED ORDER — ACETAMINOPHEN 500 MG PO TABS: 1000.0000 mg | ORAL_TABLET | Freq: Four times a day (QID) | ORAL | Status: DC | PRN

## 2016-12-01 MED ORDER — BENZOCAINE 10 % MT GEL
Freq: Four times a day (QID) | OROMUCOSAL | Status: DC | PRN
  Filled 2016-12-01: qty 9

## 2016-12-01 MED ORDER — DOCUSATE SODIUM 100 MG PO CAPS
100.0000 mg | ORAL_CAPSULE | Freq: Two times a day (BID) | ORAL | Status: DC
  Administered 2016-12-01 – 2016-12-03 (×4): 100 mg via ORAL
  Filled 2016-12-01 (×4): qty 1

## 2016-12-01 MED ORDER — INSULIN ASPART 100 UNIT/ML ~~LOC~~ SOLN
0.0000 [IU] | Freq: Three times a day (TID) | SUBCUTANEOUS | Status: DC
  Administered 2016-12-02: 1 [IU] via SUBCUTANEOUS
  Administered 2016-12-03: 2 [IU] via SUBCUTANEOUS
  Filled 2016-12-01 (×2): qty 1

## 2016-12-01 MED ORDER — METFORMIN HCL ER 500 MG PO TB24
500.0000 mg | ORAL_TABLET | Freq: Two times a day (BID) | ORAL | Status: DC
  Administered 2016-12-01 – 2016-12-03 (×5): 500 mg via ORAL
  Filled 2016-12-01 (×7): qty 1

## 2016-12-01 MED ORDER — CLOZAPINE 100 MG PO TABS
500.0000 mg | ORAL_TABLET | Freq: Every day | ORAL | Status: DC
  Administered 2016-12-01 – 2016-12-02 (×2): 500 mg via ORAL
  Filled 2016-12-01 (×2): qty 5

## 2016-12-01 MED ORDER — ZIPRASIDONE HCL 40 MG PO CAPS
80.0000 mg | ORAL_CAPSULE | Freq: Two times a day (BID) | ORAL | Status: DC
  Administered 2016-12-01 – 2016-12-02 (×3): 80 mg via ORAL
  Filled 2016-12-01 (×3): qty 2

## 2016-12-01 MED ORDER — ACETAMINOPHEN 325 MG PO TABS
650.0000 mg | ORAL_TABLET | Freq: Four times a day (QID) | ORAL | Status: DC | PRN
  Administered 2016-12-01: 650 mg via ORAL
  Filled 2016-12-01: qty 2

## 2016-12-01 MED ORDER — INSULIN DETEMIR 100 UNIT/ML ~~LOC~~ SOLN
14.0000 [IU] | SUBCUTANEOUS | Status: DC
  Administered 2016-12-01 – 2016-12-03 (×3): 14 [IU] via SUBCUTANEOUS
  Filled 2016-12-01 (×3): qty 0.14

## 2016-12-01 MED ORDER — ALUM & MAG HYDROXIDE-SIMETH 200-200-20 MG/5ML PO SUSP: 30.0000 mL | ORAL | Status: DC | PRN

## 2016-12-01 MED ORDER — INSULIN ASPART 100 UNIT/ML ~~LOC~~ SOLN: 0.0000 [IU] | Freq: Every day | SUBCUTANEOUS | Status: DC

## 2016-12-01 MED ORDER — OMEGA-3-ACID ETHYL ESTERS 1 G PO CAPS
1.0000 g | ORAL_CAPSULE | Freq: Every day | ORAL | Status: DC
  Administered 2016-12-01 – 2016-12-03 (×3): 1 g via ORAL
  Filled 2016-12-01 (×4): qty 1

## 2016-12-01 MED ORDER — BUPROPION HCL ER (XL) 150 MG PO TB24
150.0000 mg | ORAL_TABLET | Freq: Every day | ORAL | Status: DC
  Administered 2016-12-01 – 2016-12-03 (×3): 150 mg via ORAL
  Filled 2016-12-01 (×3): qty 1

## 2016-12-01 MED ORDER — IPRATROPIUM BROMIDE 0.06 % NA SOLN
1.0000 | Freq: Every day | NASAL | Status: DC
  Administered 2016-12-01 – 2016-12-02 (×2): 1 via NASAL
  Filled 2016-12-01: qty 15

## 2016-12-01 MED ORDER — MAGNESIUM HYDROXIDE 400 MG/5ML PO SUSP: 30.0000 mL | Freq: Every day | ORAL | Status: DC | PRN

## 2016-12-01 MED ORDER — MOMETASONE FURO-FORMOTEROL FUM 200-5 MCG/ACT IN AERO
2.0000 | INHALATION_SPRAY | Freq: Two times a day (BID) | RESPIRATORY_TRACT | Status: DC
  Administered 2016-12-01 – 2016-12-03 (×5): 2 via RESPIRATORY_TRACT
  Filled 2016-12-01: qty 8.8

## 2016-12-01 MED ORDER — CLOZAPINE 25 MG PO TABS: 450.0000 mg | ORAL_TABLET | Freq: Every day | ORAL | Status: DC

## 2016-12-01 MED ORDER — ASENAPINE MALEATE 5 MG SL SUBL
5.0000 mg | SUBLINGUAL_TABLET | Freq: Three times a day (TID) | SUBLINGUAL | Status: DC
  Administered 2016-12-01: 5 mg via SUBLINGUAL
  Filled 2016-12-01: qty 1

## 2016-12-01 MED ORDER — POLYETHYLENE GLYCOL 3350 17 G PO PACK
17.0000 g | PACK | Freq: Every day | ORAL | Status: DC
  Filled 2016-12-01 (×2): qty 1

## 2016-12-01 MED ORDER — LITHIUM CARBONATE ER 450 MG PO TBCR
450.0000 mg | EXTENDED_RELEASE_TABLET | Freq: Two times a day (BID) | ORAL | Status: DC
  Administered 2016-12-01 – 2016-12-03 (×4): 450 mg via ORAL
  Filled 2016-12-01 (×5): qty 1
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
## 2016-12-01 NOTE — Progress Notes (Signed)
   12/01/16 1400  Clinical Encounter Type  Visited With Patient;Other (Comment)  Visit Type Psychological support;Spiritual support;Social support;Behavioral Health;Other (Comment) (Group)    Patient was present in the small group being led by Chaplain. Patient did participate and shared a little of his story with group.

## 2016-12-01 NOTE — Consult Note (Signed)
Pharmacy consulted for clozapine monitoring. Pt is registered with the [facility] program. Last ANC today-2.6. Check weekly while inpatient.   Olene FlossMelissa D Saulo Anthis, Pharm.D, BCPS Clinical Pharmacist

## 2016-12-01 NOTE — [facility] Group Notes (Signed)
[facility] Group Notes:  (Nursing/MHT/Case Management/Adjunct)  Date:  12/01/2016  Time:  1:53 PM  Type of Therapy:  Psychoeducational Skills  Participation Level:  Active  Participation Quality:  Appropriate, Attentive and Sharing  Affect:  Appropriate  Cognitive:  Alert and Appropriate  Insight:  Appropriate  Engagement in Group:  Engaged  Modes of Intervention:  Discussion, Education and Support  Summary of Progress/Problems:  Lance SmokeCara Travis [name] 12/01/2016, 1:53 PM

## 2016-12-01 NOTE — [facility] Suicide Risk Assessment (Signed)
[facility]   Nursing information obtained from:    Demographic factors:    Current Mental Status:    Loss Factors:    Historical Factors:    Risk Reduction Factors:     Total Time spent with patient: 1 hour Principal Problem: Schizoaffective disorder, bipolar type (HCC) Diagnosis:   Patient Active Problem List   Diagnosis Date Noted  . Tobacco use disorder [F17.200] 09/01/2016  . Schizoaffective disorder, bipolar type (HCC) [F25.0] 05/22/2016  . Diabetes mellitus without complication (HCC) [E11.9] 05/21/2016  . Hyperlipidemia [E78.5] 05/21/2016  . History of pulmonary embolism [Z86.711] 05/21/2016  . Chronic anticoagulation [Z79.01] 05/21/2016  . Hypertension [I10] 09/25/2015   Subjective Data:   Continued Clinical Symptoms:  Alcohol Use Disorder Identification Test Final Score (AUDIT): 2 The "Alcohol Use Disorders Identification Test", Guidelines for Use in Primary Care, Second Edition.  World Science writerHealth Organization [facility](WHO). Score between 0-7:  no or low risk or alcohol related problems. Score between 8-15:  moderate risk of alcohol related problems. Score between 16-19:  high risk of alcohol related problems. Score 20 or above:  warrants further diagnostic evaluation for alcohol dependence and treatment.   CLINICAL FACTORS:   Alcohol/Substance Abuse/Dependencies Schizophrenia:   Paranoid or undifferentiated type Currently Psychotic Previous Psychiatric Diagnoses and Treatments     Psychiatric Specialty Exam: Physical Exam  ROS  Blood pressure 137/71, pulse 71, temperature 97.6 F (36.4 C), temperature source Oral, resp. rate 18, height 5\' 10"  (1.778 m), weight 85.3 kg (188 lb), SpO2 98 %.Body mass index is 26.98 kg/m.                                                    Sleep:  Number of Hours: 3.75      COGNITIVE FEATURES THAT CONTRIBUTE TO RISK:  Closed-mindedness    SUICIDE RISK:   Moderate:  Frequent suicidal  ideation with limited intensity, and duration, some specificity in terms of plans, no associated intent, good self-control, limited dysphoria/symptomatology, some risk factors present, and identifiable protective factors, including available and accessible social support.  [facility]  I certify that inpatient services furnished can reasonably be expected to improve the patient's condition.   Jimmy FootmanHernandez-Gonzalez,  Lance Frees, MD 12/01/2016, 2:29 PM
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
## 2016-12-01 NOTE — Progress Notes (Signed)
Admission Note:  [age] yr male who presents IVC in no acute distress for the treatment of SI and substance abuse.  Pt appears flat and depressed. Pt was calm and cooperative with admission process. Pt presents with passive SI and contracts for safety upon admission. Pt denies AVH . Pt  has Past medical Hx of DM without complication, HTN, Pulmonary embolism. Pt lives in a group home and was brought here because of suicidal ideation. Skin was assessed and found to have one tattoo in the upper chest area. Patient searched and no contraband found, POC and unit policies explained and understanding verbalized. Consents obtained. Food and fluids offered, and fluids accepted. Pt had no additional questions or concerns, 15 minutes checks maintained will continue to monitor.

## 2016-12-01 NOTE — Progress Notes (Signed)
Pt denies current SI, HI, a/v hallucinations. He commits to safety while on the unit. Pt is medication and meal compliant. No aggressive behaviors noted. Will continue to monitor for safety.

## 2016-12-01 NOTE — ED Notes (Signed)
Preparing patient for transfer to [facility]. 

## 2016-12-01 NOTE — Consult Note (Signed)
ANTICOAGULATION CONSULT NOTE - Initial Consult  Pharmacy Consult for warfarin Indication: hx of PE  Allergies  Allergen Reactions  . Penicillins Other (See Comments)    Reaction: "lockjaw" Has patient had a PCN reaction causing immediate rash, facial/tongue/throat swelling, SOB or lightheadedness with hypotension: No Has patient had a PCN reaction causing severe rash involving mucus membranes or skin necrosis: No Has patient had a PCN reaction that required hospitalization: No Has patient had a PCN reaction occurring within the last 10 years: No If all of the above answers are "NO", then may proceed with Cephalosporin use.     Patient Measurements: Height: 5\' 10"  (177.8 cm) Weight: 188 lb (85.3 kg) IBW/kg (Calculated) : 73 Heparin Dosing Weight:   Vital Signs:    Labs:  Recent Labs  11/29/16 2209 12/01/16 0719 12/01/16 1554  HGB 15.1 14.6  --   HCT 42.8 42.1  --   PLT 228 183  --   LABPROT 53.8*  --  35.3*  INR 5.79*  --  3.42  CREATININE 1.02  --   --     Estimated Creatinine Clearance: 97.4 mL/min (by C-G formula based on SCr of 1.02 mg/dL).   Medical History: Past Medical History:  Diagnosis Date  . Depression   . Diabetes mellitus without complication (HCC)   . Hyperlipidemia   . Hypertension   . Lupus anticoagulant disorder (HCC) 06/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  . PE (pulmonary thromboembolism) (HCC)   . Schizo affective schizophrenia (HCC)   . Supratherapeutic INR 11/19/2016    Medications:  Scheduled:  . buPROPion  150 mg Oral Daily  . cloZAPine  500 mg Oral QHS  . docusate sodium  100 mg Oral BID  . insulin aspart  0-5 Units Subcutaneous QHS  . insulin aspart  0-9 Units Subcutaneous TID WC  . insulin detemir  14 Units Subcutaneous [id number]  . ipratropium  1 spray Each Nare QHS  . lithium carbonate  450 mg Oral Q12H  . metformin  500 mg Oral BID WC  . mometasone-formoterol  2 puff Inhalation BID  . nicotine  21 mg Transdermal  Daily  . omega-3 acid ethyl esters  1 g Oral Daily  . polyethylene glycol  17 g Oral Daily  . simvastatin  40 mg Oral QHS  . ziprasidone  80 mg Oral BID WC    Assessment: Pt is a 41 year old male with schizoaffective disorder bipolar admitted to the physiatric floor. Pt has a hx of PE and is on warfarin OP. On admission pt INR was supratherapeutic at 5.79. INR today is 3.42. Home dose is 7.5mg  daily  Goal of Therapy:  INR 2-3 Monitor platelets by anticoagulation protocol: Yes   Plan:  Continue to hold warfarin until INR therapeutic. Will recheck INR tomorrow.  Olene FlossMelissa D Lavine Hargrove, Pharm.D, BCPS Clinical Pharmacist  12/01/2016,7:12 PM

## 2016-12-01 NOTE — Plan of Care (Signed)
Problem: Safety: Goal: Ability to remain free from injury will improve Outcome: Progressing Pt remains safe while in hospital    

## 2016-12-01 NOTE — [facility] Group Notes (Signed)
[facility] Group Notes:  (Nursing/MHT/Case Management/Adjunct)  Date:  12/01/2016  Time:  9:45 PM  Type of Therapy:  Group Therapy  Participation Level:  Active  Participation Quality:  Appropriate  Affect:  Appropriate  Cognitive:  Appropriate  Insight:  Appropriate  Engagement in Group:  Engaged  Modes of Intervention:  Discussion  Summary of Progress/Problems:  Lance [name] 12/01/2016, 9:45 PM

## 2016-12-01 NOTE — Tx Team (Signed)
Initial Treatment Plan 12/01/2016 4:33 AM Lance AlbrightJames P Moomaw ZOX:[id number]    PATIENT STRESSORS: Health problems Substance abuse   PATIENT STRENGTHS: Communication skills Motivation for treatment/growth   PATIENT IDENTIFIED PROBLEMS: Substance Abuse     Suicide ideation.                  DISCHARGE CRITERIA:  Improved stabilization in mood, thinking, and/or behavior Motivation to continue treatment in a less acute level of care  PRELIMINARY DISCHARGE PLAN: Outpatient therapy Participate in family therapy  PATIENT/FAMILY INVOLVEMENT: This treatment plan has been presented to and reviewed with the patient, Lance [name], The patient and family have been given the opportunity to ask questions and make suggestions.  Trula Orelubukola Abisola Donnell Wion, RN 12/01/2016, 4:33 AM

## 2016-12-01 NOTE — [facility] Group Notes (Signed)
Goals Group Date/Time: 12/01/2016 9:00 AM Type of Therapy and Topic: Group Therapy: Goals Group: SMART Goals   Participation Level: did not attend   Glennon MacSara P Elida Harbin, LCSW 12/01/2016, 5:05 PM

## 2016-12-01 NOTE — Plan of Care (Signed)
Problem: Coping: Goal: Ability to verbalize frustrations and anger appropriately will improve Outcome: Progressing Patient verbalized feeling to staff.    

## 2016-12-01 NOTE — [facility] Group Notes (Signed)
[facility] LCSW Group Therapy Note  Date/Time  Type of Therapy/Topic:  Group Therapy:  Feelings about Diagnosis  Participation Level:  None   Mood: okay   Description of Group:    This group will allow patients to explore their thoughts and feelings about diagnoses they have received. Patients will be guided to explore their level of understanding and acceptance of these diagnoses. Facilitator will encourage patients to process their thoughts and feelings about the reactions of others to their diagnosis, and will guide patients in identifying ways to discuss their diagnosis with significant others in their lives. This group will be process-oriented, with patients participating in exploration of their own experiences as well as giving and receiving support and challenge from other group members.   Therapeutic Goals: 1. Patient will demonstrate understanding of diagnosis as evidence by identifying two or more symptoms of the disorder:  2. Patient will be able to express two feelings regarding the diagnosis 3. Patient will demonstrate ability to communicate their needs through discussion and/or role plays  Summary of Patient Progress:  Pt listened attentively, but did not add to the group discussion    Therapeutic Modalities:   Cognitive Behavioral Therapy Brief Therapy Feelings Identification      Glennon MacSara P William Laske, LCSW 12/01/2016, 5:05 PM

## 2016-12-02 LAB — LIPID PANEL
CHOL/HDL RATIO: 3.5 ratio
Cholesterol: 140 mg/dL (ref 0–200)
HDL: 40 mg/dL — AB (ref >40)
LDL CALC: 60 mg/dL (ref 0–99)
Triglycerides: 202 mg/dL — ABNORMAL HIGH (ref <150)
VLDL: 40 mg/dL (ref 0–40)

## 2016-12-02 LAB — TSH: TSH: 1.838 u[IU]/mL (ref 0.350–4.500)

## 2016-12-02 LAB — GLUCOSE, CAPILLARY
Glucose-Capillary: 101 mg/dL — ABNORMAL HIGH (ref 65–99)
Glucose-Capillary: 121 mg/dL — ABNORMAL HIGH (ref 65–99)
Glucose-Capillary: 117 mg/dL — ABNORMAL HIGH (ref 65–99)

## 2016-12-02 LAB — PROTIME-INR
INR: 1.98
Prothrombin Time: 22.8 seconds — ABNORMAL HIGH (ref 11.4–15.2)

## 2016-12-02 MED ORDER — ZIPRASIDONE HCL 40 MG PO CAPS
40.0000 mg | ORAL_CAPSULE | Freq: Two times a day (BID) | ORAL | Status: DC
  Administered 2016-12-02 – 2016-12-03 (×2): 40 mg via ORAL
  Filled 2016-12-02 (×2): qty 1

## 2016-12-02 NOTE — Progress Notes (Signed)
[facility] MD Progress Note  12/02/2016 9:20 AM Lance [name]  MRN:  [id number] Subjective:  41 y/o wm who brought himself to the police station on 6/17.  He explains he smoked marijuana for the first time in months.  He started to feel very sick afterwards.  He c/o nausea, vomiting, severe headache, palpitation, felt as if he was leaving his body and turning into an angel. He was seeing shapes and figures and static electricity in the air.  This led to SI and thought of cutting his wrist. He generally feels very depressed and sad. He also has chronic issues with insomnia. He lost interest in things, complains of poor concentration and has developed some feelings of guilt about using drugs.  Patient is a resident at [facility] [phone]), [place]. He is under the guardianship of [name] and receives services through [facility]  6/20 patient said he is doing better. No longer seeing the shapes or hearing voices. No longer having suicidal thoughts. His main complaint was he was a little sedated today but other than that he was doing well. Denies physical complaints. Denies any other side effects. Slept well last night appetite is within the normal limits. No longer having any episodes of nausea or vomiting  Per nursing: Pt denies current SI, HI, a/v hallucinations. He commits to safety while on the unit. Pt is medication and meal compliant. No aggressive behaviors noted. Will continue to monitor for safety.   Principal Problem: Schizoaffective disorder, bipolar type (HCC) Diagnosis:   Patient Active Problem List   Diagnosis Date Noted  . Tobacco use disorder [F17.200] 09/01/2016  . Schizoaffective disorder, bipolar type (HCC) [F25.0] 05/22/2016  . Diabetes mellitus without complication (HCC) [E11.9] 05/21/2016  . Hyperlipidemia [E78.5] 05/21/2016  . History of pulmonary embolism [Z86.711] 05/21/2016  . Chronic anticoagulation [Z79.01] 05/21/2016  .  Hypertension [I10] 09/25/2015   Total Time spent with patient: 30 minutes  Past Psychiatric History: Patient states he was diagnosed with schizophrenia as a teenager. F/u by [facility].  At age 41 he received a diagnosis of ADHD  Per records he has a history of self injury and he tried to overdose at the age of 41.  Past Medical History:  Past Medical History:  Diagnosis Date  . Depression   . Diabetes mellitus without complication (HCC)   . Hyperlipidemia   . Hypertension   . Lupus anticoagulant disorder (HCC) 06/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  . PE (pulmonary thromboembolism) (HCC)   . Schizo affective schizophrenia (HCC)   . Supratherapeutic INR 11/19/2016   History reviewed. No pertinent surgical history.  Family History:  Family History  Problem Relation Age of Onset  . CAD Mother   . CAD Sister    Family Psychiatric  History: Patient reports that his mother had multiple personality disorder and is an alcoholic. His father was diagnosed with depression and alcoholism. He also has a sister who is alcoholic  Social History: Patient is single, never married, he doesn't have any children. [place] and has been there for the last 2 years. Highest level of education is eighth grade.  No current legal charges but in the past he has received charges for assault--- has been in jail in the past. History  Alcohol Use No    Comment: occassionally     History  Drug Use  . Types: Marijuana    Comment:  Last use 11/29/16    Social History   Social History  . Marital status: Single    Spouse name: N/A  . Number of children: N/A  . Years of education: N/A   Social History Main Topics  . Smoking status: Current Every Day Smoker    Packs/day: 1.00    Years: 23.00    Types: Cigarettes  . Smokeless tobacco: Never Used  . Alcohol use No     Comment: occassionally  . Drug use: Yes    Types: Marijuana     Comment: Last  use 11/29/16  . Sexual activity: No     Comment: occasional marijuana- none recently   Other Topics Concern  . None   Social History Narrative   From a group home in [place]     Current Medications: Current Facility-Administered Medications  Medication Dose Route Frequency Provider Last Rate Last Dose  . acetaminophen (TYLENOL) tablet 1,000 mg  1,000 mg Oral Q6H PRN Jimmy Footman, MD      . alum & mag hydroxide-simeth (MAALOX/MYLANTA) 200-200-20 MG/5ML suspension 30 mL  30 mL Oral Q4H PRN Clapacs, [name], MD      . benzocaine (ORAJEL) 10 % mucosal gel   Mouth/Throat QID PRN Jimmy Footman, MD      . buPROPion (WELLBUTRIN XL) 24 hr tablet 150 mg  150 mg Oral Daily Clapacs, [name], MD   150 mg at 12/02/16 0825  . cloZAPine (CLOZARIL) tablet 500 mg  500 mg Oral QHS Jimmy Footman, MD   500 mg at 12/01/16 2203  . docusate sodium (COLACE) capsule 100 mg  100 mg Oral BID Jimmy Footman, MD   100 mg at 12/02/16 0826  . insulin aspart (novoLOG) injection 0-5 Units  0-5 Units Subcutaneous QHS Hernandez-Gonzalez, Sue Lush, MD      . insulin aspart (novoLOG) injection 0-9 Units  0-9 Units Subcutaneous TID WC Hernandez-Gonzalez, Sue Lush, MD      . insulin detemir (LEVEMIR) injection 14 Units  14 Units Subcutaneous Bryn Gulling, [name], MD   14 Units at 12/02/16 [phone]  . ipratropium (ATROVENT) 0.06 % nasal spray 1 spray  1 spray Each Nare QHS Clapacs, [name], MD   1 spray at 12/01/16 2201  . lithium carbonate (ESKALITH) CR tablet 450 mg  450 mg Oral Q12H Clapacs, [name], MD   450 mg at 12/02/16 0826  . magnesium hydroxide (MILK OF MAGNESIA) suspension 30 mL  30 mL Oral Daily PRN Clapacs, [name], MD      . metFORMIN (GLUCOPHAGE-XR) 24 hr tablet 500 mg  500 mg Oral BID WC Clapacs, [name], MD   500 mg at 12/02/16 0826  . mometasone-formoterol (DULERA) 200-5 MCG/ACT inhaler 2 puff  2 puff Inhalation BID Clapacs, [name], MD   2 puff at 12/02/16 0825  .  nicotine (NICODERM CQ - dosed in mg/24 hours) patch 21 mg  21 mg Transdermal Daily Jimmy Footman, MD   21 mg at 12/02/16 0825  . omega-3 acid ethyl esters (LOVAZA) capsule 1 g  1 g Oral Daily Clapacs, [name], MD   1 g at 12/02/16 0827  . polyethylene glycol (MIRALAX / GLYCOLAX) packet 17 g  17 g Oral Daily Jimmy Footman, MD      . simvastatin (ZOCOR) tablet 40 mg  40 mg Oral QHS Clapacs, [name], MD   40 mg at 12/01/16 2205  . ziprasidone (GEODON) capsule 80 mg  80 mg Oral BID WC Clapacs, [name], MD  80 mg at 12/02/16 0825    Lab Results:  Results for orders placed or performed during the hospital encounter of 12/01/16 (from the past 48 hour(s))  CBC with Differential/Platelet     Status: None   Collection Time: 12/01/16  7:19 AM  Result Value Ref Range   WBC 4.5 3.8 - 10.6 K/uL   RBC 4.68 4.40 - 5.90 MIL/uL   Hemoglobin 14.6 13.0 - 18.0 g/dL   HCT 16.1 09.6 - 04.5 %   MCV 90.0 80.0 - 100.0 fL   MCH 31.3 26.0 - 34.0 pg   MCHC 34.7 32.0 - 36.0 g/dL   RDW 40.9 81.1 - 91.4 %   Platelets 183 150 - 440 K/uL   Neutrophils Relative % 59 %   Neutro Abs 2.6 1.4 - 6.5 K/uL   Lymphocytes Relative 29 %   Lymphs Abs 1.3 1.0 - 3.6 K/uL   Monocytes Relative 12 %   Monocytes Absolute 0.5 0.2 - 1.0 K/uL   Eosinophils Relative 0 %   Eosinophils Absolute 0.0 0 - 0.7 K/uL   Basophils Relative 0 %   Basophils Absolute 0.0 0 - 0.1 K/uL  Glucose, capillary     Status: None   Collection Time: 12/01/16  7:27 AM  Result Value Ref Range   Glucose-Capillary 89 65 - 99 mg/dL   Comment 1 Notify RN   Protime-INR     Status: Abnormal   Collection Time: 12/01/16  3:54 PM  Result Value Ref Range   Prothrombin Time 35.3 (H) 11.4 - 15.2 seconds   INR 3.42   Glucose, capillary     Status: Abnormal   Collection Time: 12/01/16  4:38 PM  Result Value Ref Range   Glucose-Capillary 114 (H) 65 - 99 mg/dL  Glucose, capillary     Status: None   Collection Time: 12/01/16  8:44 PM  Result  Value Ref Range   Glucose-Capillary 92 65 - 99 mg/dL  Glucose, capillary     Status: Abnormal   Collection Time: 12/02/16  6:47 AM  Result Value Ref Range   Glucose-Capillary 101 (H) 65 - 99 mg/dL    Blood Alcohol level:  Lab Results  Component Value Date   ETH <5 11/29/2016   ETH <5 08/31/2016    Metabolic Disorder Labs: Lab Results  Component Value Date   HGBA1C 5.3 08/31/2016   MPG 105 08/31/2016   MPG 108 05/22/2016   Lab Results  Component Value Date   PROLACTIN 2.0 (L) 08/31/2016   PROLACTIN 4.8 05/22/2016   Lab Results  Component Value Date   CHOL 135 08/31/2016   TRIG 247 (H) 08/31/2016   HDL 32 (L) 08/31/2016   CHOLHDL 4.2 08/31/2016   VLDL 49 (H) 08/31/2016   LDLCALC 54 08/31/2016   LDLCALC 66 05/22/2016    Physical Findings: AIMS:  , ,  ,  ,    CIWA:    COWS:     Musculoskeletal: Strength & Muscle Tone: within normal limits Gait & Station: normal Patient leans: N/A  Psychiatric Specialty Exam: Physical Exam  Constitutional: He is oriented to person, place, and time. He appears well-developed and well-nourished.  HENT:  Head: Normocephalic and atraumatic.  Eyes: Conjunctivae and EOM are normal.  Neck: Normal range of motion.  Respiratory: Effort normal.  Musculoskeletal: Normal range of motion.  Neurological: He is oriented to person, place, and time.    Review of Systems  Constitutional: Negative.   HENT: Negative.   Eyes: Negative.  Respiratory: Negative.   Cardiovascular: Negative.   Gastrointestinal: Negative.   Genitourinary: Negative.   Musculoskeletal: Negative.   Skin: Negative.   Neurological: Negative.   Endo/Heme/Allergies: Negative.   Psychiatric/Behavioral: Positive for depression. Negative for hallucinations, memory loss, substance abuse and suicidal ideas. The patient is not nervous/anxious and does not have insomnia.     Blood pressure 127/83, pulse 63, temperature 97.7 F (36.5 C), temperature source Oral, resp.  rate 17, height 5\' 10"  (1.778 m), weight 85.3 kg (188 lb), SpO2 99 %.Body mass index is 26.98 kg/m.  General Appearance: Fairly Groomed  Eye Contact:  Good  Speech:  Clear and Coherent  Volume:  Normal  Mood:  Dysphoric  Affect:  Appropriate and Congruent  Thought Process:  Linear and Descriptions of Associations: Intact  Orientation:  Full (Time, Place, and Person)  Thought Content:  Hallucinations: None  Suicidal Thoughts:  No  Homicidal Thoughts:  No  Memory:  Immediate;   Fair Recent;   Fair Remote;   Fair  Judgement:  Poor  Insight:  Shallow  Psychomotor Activity:  Decreased  Concentration:  Concentration: Fair and Attention Span: Fair  Recall:  Good  Fund of Knowledge:  Fair  Language:  Good  Akathisia:  No  Handed:    AIMS (if indicated):     Assets:  Manufacturing systems engineer Social Support  ADL's:  Intact  Cognition:  WNL  Sleep:  Number of Hours: 7     Treatment Plan Summary:  Patient is a 41 year old single Caucasian male with schizoaffective disorder bipolar type. Patient presented to the emergency Department     Schizoaffective disorder bipolar type: Patient will be continue on  Clozaril  500mg  qhs. Plan to taper off geodon due to excessive sedation  Continue wellbutrin XL 150 mg po q day for depression nd smoking cessation  Continue lithium CR 450 mg po bid--will need level prior to discharge  Insomnia: will target with clozaril as it is very sedating  History of pulmonary embolism: Continue warfarin. Pharmacy has been consulted for warfarin monitoring  Diabetes patient will be continued on metformin XR 500 mg po bid and levemir 14 U q am  Dyslipidemia continue Zocor 40 mg by mouth daily  COPD: continue with dulera and albuterol  Tobacco use disorder: Continue nicotine patch 21 mg a day.  Constipation: continue Colace 100 mg twice a day and MiraLAX by mouth daily  Sialorrhea: continue atroven sublingual  Dispo: back to the group  home   Follow-up patient will follow up with [name]   Diet: carb modified and low sodium  Jimmy Footman, MD 12/02/2016, 9:20 AM

## 2016-12-02 NOTE — Progress Notes (Signed)
Pt reports having depressed mood. He denies having SI, HI, a/v hallucinations at this time. Pt seen resting in bed most of day stated, "Can I rest in peace?". He has very irritable mood and very withdrawn. He is compliant with his po medications. Will continue to monitor.

## 2016-12-02 NOTE — [facility] Suicide Risk Assessment (Signed)
[facility] INPATIENT:  Family/Significant Other Suicide Prevention Education  Suicide Prevention Education:  Education Completed; Lance [name], legal guardian, [phone] has been identified by the patient as the family member/significant other with whom the patient will be residing, and identified as the person(s) who will aid the patient in the event of a mental health crisis (suicidal ideations/suicide attempt).  With written consent from the patient, the family member/significant other has been provided the following suicide prevention education, prior to the and/or following the discharge of the patient.  The suicide prevention education provided includes the following:  Suicide risk factors  Suicide prevention and interventions  National Suicide Hotline telephone number  [facility] assessment telephone number  [place] Emergency Assistance 911  [facility] and/or [facility]idential [facility] telephone number  Request made of family/significant other to:  Remove weapons (e.g., guns, rifles, knives), all items previously/currently identified as safety concern.    Remove drugs/medications (over-the-counter, prescriptions, illicit drugs), all items previously/currently identified as a safety concern.  The family member/significant other verbalizes understanding of the suicide prevention education information provided.  The family member/significant other agrees to remove the items of safety concern listed above.  Lance [name], MSW, LCSW-A 12/02/2016, 1:17 PM

## 2016-12-02 NOTE — Plan of Care (Signed)
Problem: Safety: Goal: Ability to remain free from injury will improve Patient will not exhibit any harm to self and others.  Outcome: Progressing Pt displays safe behaviors while in hospital commits to safety.

## 2016-12-02 NOTE — [facility] Group Notes (Signed)
  [facility]  Date/Time:12/02/2016 1pm  Type of Therapy/Topic:  Group Therapy:  Emotion Regulation  Participation Level:  Did Not Attend    Lance MacSara P Zhoey Blackstock, LCSW 12/02/2016, 2:59 PM

## 2016-12-02 NOTE — Tx Team (Signed)
Interdisciplinary Treatment and Diagnostic Plan Update  12/02/2016 Time of Session: 11:45AM Lance [name] MRN: [id number]  Principal Diagnosis: Schizoaffective disorder, bipolar type [facility](HCC)  Secondary Diagnoses: Principal Problem:   Schizoaffective disorder, bipolar type (HCC) Active Problems:   Hypertension   Diabetes mellitus without complication (HCC)   Hyperlipidemia   History of pulmonary embolism   Chronic anticoagulation   Tobacco use disorder   Current Medications:  Current Facility-Administered Medications  Medication Dose Route Frequency Provider Last Rate Last Dose  . acetaminophen (TYLENOL) tablet 1,000 mg  1,000 mg Oral Q6H PRN Jimmy FootmanHernandez-Gonzalez, Andrea, MD      . alum & mag hydroxide-simeth (MAALOX/MYLANTA) 200-200-20 MG/5ML suspension 30 mL  30 mL Oral Q4H PRN Clapacs, [name], MD      . benzocaine (ORAJEL) 10 % mucosal gel   Mouth/Throat QID PRN Jimmy FootmanHernandez-Gonzalez, Andrea, MD      . buPROPion (WELLBUTRIN XL) 24 hr tablet 150 mg  150 mg Oral Daily Clapacs, Jackquline DenmarkJohn T, MD   150 mg at 12/02/16 0825  . cloZAPine (CLOZARIL) tablet 500 mg  500 mg Oral QHS Jimmy FootmanHernandez-Gonzalez, Andrea, MD   500 mg at 12/01/16 2203  . docusate sodium (COLACE) capsule 100 mg  100 mg Oral BID Jimmy FootmanHernandez-Gonzalez, Andrea, MD   100 mg at 12/02/16 0826  . insulin aspart (novoLOG) injection 0-5 Units  0-5 Units Subcutaneous QHS Hernandez-Gonzalez, Sue LushAndrea, MD      . insulin aspart (novoLOG) injection 0-9 Units  0-9 Units Subcutaneous TID WC Hernandez-Gonzalez, Sue LushAndrea, MD      . insulin detemir (LEVEMIR) injection 14 Units  14 Units Subcutaneous Bryn GullingBH-q7a Clapacs, Jackquline DenmarkJohn T, MD   14 Units at 12/02/16 [phone]  . ipratropium (ATROVENT) 0.06 % nasal spray 1 spray  1 spray Each Nare QHS Clapacs, Jackquline DenmarkJohn T, MD   1 spray at 12/01/16 2201  . lithium carbonate (ESKALITH) CR tablet 450 mg  450 mg Oral Q12H Clapacs, Jackquline DenmarkJohn T, MD   450 mg at 12/02/16 0826  . magnesium hydroxide (MILK OF MAGNESIA) suspension 30 mL  30 mL Oral Daily  PRN Clapacs, [name], MD      . metFORMIN (GLUCOPHAGE-XR) 24 hr tablet 500 mg  500 mg Oral BID WC Clapacs, Jackquline DenmarkJohn T, MD   500 mg at 12/02/16 0826  . mometasone-formoterol (DULERA) 200-5 MCG/ACT inhaler 2 puff  2 puff Inhalation BID Clapacs, [name], MD   2 puff at 12/02/16 0825  . nicotine (NICODERM CQ - dosed in mg/24 hours) patch 21 mg  21 mg Transdermal Daily Jimmy FootmanHernandez-Gonzalez, Andrea, MD   21 mg at 12/02/16 0825  . omega-3 acid ethyl esters (LOVAZA) capsule 1 g  1 g Oral Daily Clapacs, Jackquline DenmarkJohn T, MD   1 g at 12/02/16 0827  . polyethylene glycol (MIRALAX / GLYCOLAX) packet 17 g  17 g Oral Daily Jimmy FootmanHernandez-Gonzalez, Andrea, MD      . simvastatin (ZOCOR) tablet 40 mg  40 mg Oral QHS Clapacs, Jackquline DenmarkJohn T, MD   40 mg at 12/01/16 2205  . ziprasidone (GEODON) capsule 40 mg  40 mg Oral BID WC Jimmy FootmanHernandez-Gonzalez, Andrea, MD       PTA Medications: Prescriptions Prior to Admission  Medication Sig Dispense Refill Last Dose  . asenapine (SAPHRIS) 5 MG SUBL 24 hr tablet Place 5 mg under the tongue 3 (three) times daily.   Past Week at Unknown time  . buPROPion (WELLBUTRIN XL) 150 MG 24 hr tablet Take 1 tablet (150 mg total) by mouth daily. 30 tablet 0 Past Week  at Unknown time  . cloZAPine (CLOZARIL) 100 MG tablet Take 4.5 tablets (450 mg total) by mouth at bedtime. 135 tablet 0 Past Week at Unknown time  . docusate sodium (COLACE) 100 MG capsule Take 2 capsules (200 mg total) by mouth 2 (two) times daily. 120 capsule 0 Past Week at Unknown time  . insulin detemir (LEVEMIR) 100 UNIT/ML injection Inject 14 Units into the skin every morning.   Past Week at Unknown time  . ipratropium (ATROVENT) 0.06 % nasal spray Place 1 spray into both nostrils at bedtime. 3 mL 0 Past Week at Unknown time  . lithium carbonate (ESKALITH) 450 MG CR tablet Take 1 tablet (450 mg total) by mouth every 12 (twelve) hours. 60 tablet 0 Past Week at Unknown time  . loperamide (IMODIUM A-D) 2 MG tablet Take 2 mg by mouth 4 (four) times daily as  needed for diarrhea or loose stools.   prn at prn  . metformin (FORTAMET) 500 MG (OSM) 24 hr tablet Take 500 mg by mouth 2 (two) times daily with a meal.   Past Week at Unknown time  . mometasone-formoterol (DULERA) 200-5 MCG/ACT AERO Inhale 2 puffs into the lungs 2 (two) times daily. 1 Inhaler 1 Past Week at Unknown time  . omega-3 acid ethyl esters (LOVAZA) 1 g capsule Take 1 g by mouth daily.   Past Week at Unknown time  . ondansetron (ZOFRAN-ODT) 4 MG disintegrating tablet Take 4 mg by mouth every 8 (eight) hours as needed for nausea or vomiting.   prn at prn  . polyethylene glycol (MIRALAX / GLYCOLAX) packet Take 17 g by mouth daily. 30 each 0 Past Week at Unknown time  . simvastatin (ZOCOR) 40 MG tablet Take 40 mg by mouth at bedtime.   Past Week at Unknown time  . warfarin (COUMADIN) 3 MG tablet Take 2.5 tablets (7.5 mg total) by mouth daily at 6 PM. 20 tablet 0 Past Week at Unknown time  . ziprasidone (GEODON) 80 MG capsule Take 80 mg by mouth 2 (two) times daily with a meal.   Past Week at Unknown time  . zolpidem (AMBIEN) 5 MG tablet Take 5 mg by mouth at bedtime as needed for sleep.   Past Week at Unknown time  . nicotine (NICOTROL) 10 MG inhaler Inhale 1 Cartridge (1 continuous puffing total) into the lungs as needed for smoking cessation. (Patient not taking: Reported on 11/19/2016) 60 each 0 Not Taking at Unknown time    Patient Stressors: Health problems Substance abuse  Patient Strengths: Barrister's clerk for treatment/growth  Treatment Modalities: Medication Management, Group therapy, Case management,  1 to 1 session with clinician, Psychoeducation, Recreational therapy.   Physician Treatment Plan for Primary Diagnosis: Schizoaffective disorder, bipolar type (HCC) Long Term Goal(s): Improvement in symptoms so as ready for discharge Improvement in symptoms so as ready for discharge   Short Term Goals: Ability to identify changes in lifestyle to reduce  recurrence of condition will improve Ability to demonstrate self-control will improve Ability to verbalize feelings will improve  Medication Management: Evaluate patient's response, side effects, and tolerance of medication regimen.  Therapeutic Interventions: 1 to 1 sessions, Unit Group sessions and Medication administration.  Evaluation of Outcomes: Progressing  Physician Treatment Plan for Secondary Diagnosis: Principal Problem:   Schizoaffective disorder, bipolar type (HCC) Active Problems:   Hypertension   Diabetes mellitus without complication (HCC)   Hyperlipidemia   History of pulmonary embolism   Chronic anticoagulation   Tobacco use disorder  Long Term Goal(s): Improvement in symptoms so as ready for discharge Improvement in symptoms so as ready for discharge   Short Term Goals: Ability to identify changes in lifestyle to reduce recurrence of condition will improve Ability to demonstrate self-control will improve Ability to verbalize feelings will improve     Medication Management: Evaluate patient's response, side effects, and tolerance of medication regimen.  Therapeutic Interventions: 1 to 1 sessions, Unit Group sessions and Medication administration.  Evaluation of Outcomes: Progressing   RN Treatment Plan for Primary Diagnosis: Schizoaffective disorder, bipolar type (HCC) Long Term Goal(s): Knowledge of disease and therapeutic regimen to maintain health will improve  Short Term Goals: Ability to demonstrate self-control, Ability to disclose and discuss suicidal ideas and Compliance with prescribed medications will improve  Medication Management: RN will administer medications as ordered by provider, will assess and evaluate patient's response and provide education to patient for prescribed medication. RN will report any adverse and/or side effects to prescribing provider.  Therapeutic Interventions: 1 on 1 counseling sessions, Psychoeducation, Medication  administration, Evaluate responses to treatment, Monitor vital signs and CBGs as ordered, Perform/monitor CIWA, COWS, AIMS and Fall Risk screenings as ordered, Perform wound care treatments as ordered.  Evaluation of Outcomes: Progressing   LCSW Treatment Plan for Primary Diagnosis: Schizoaffective disorder, bipolar type (HCC) Long Term Goal(s): Safe transition to appropriate next level of care at discharge, Engage patient in therapeutic group addressing interpersonal concerns.  Short Term Goals: Engage patient in aftercare planning with referrals and resources, Increase social support, Increase emotional regulation and Increase skills for wellness and recovery  Therapeutic Interventions: Assess for all discharge needs, 1 to 1 time with Social worker, Explore available resources and support systems, Assess for adequacy in community support network, Educate family and significant other(s) on suicide prevention, Complete Psychosocial Assessment, Interpersonal group therapy.  Evaluation of Outcomes: Progressing   Progress in Treatment: Attending groups: No. Participating in groups: No. Taking medication as prescribed: Yes. Toleration medication: Yes. Family/Significant other contact made: Yes, individual(s) contacted:  legal guardian contacted. Patient understands diagnosis: Yes. Discussing patient identified problems/goals with staff: Yes. Medical problems stabilized or resolved: Yes. Denies suicidal/homicidal ideation: Yes. Issues/concerns per patient self-inventory: No.  New problem(s) identified: No, Describe:  None.  New Short Term/Long Term Goal(s): Patient stated that his goal is to get better while at the hospital.   Discharge Plan or Barriers: Patient will discharge back to group home and follow-up with [facility].  Reason for Continuation of Hospitalization: Depression Suicidal ideation  Estimated Length of Stay: D/C 6/21  Attendees: Patient: Lance [name]  12/02/2016 1:31 PM  Physician: Dr. Radene Journey, MD  12/02/2016 1:31 PM  Nursing:  12/02/2016 1:31 PM  RN Care Manager: 12/02/2016 1:31 PM  Social Worker: Hampton Abbot, MSW, LCSW-A 12/02/2016 1:31 PM  Recreational Therapist:  12/02/2016 1:31 PM  Other: Jeanann Lewandowsky, PA Student  12/02/2016 1:31 PM  Other:  12/02/2016 1:31 PM  Other: 12/02/2016 1:31 PM    Scribe for Treatment Team: Lynden Oxford, LCSWA 12/02/2016 1:31 PM

## 2016-12-02 NOTE — Progress Notes (Signed)
Pt refused 1200 BS sliding scale insulin not given

## 2016-12-03 LAB — GLUCOSE, CAPILLARY
Glucose-Capillary: 112 mg/dL — ABNORMAL HIGH (ref 65–99)
Glucose-Capillary: 178 mg/dL — ABNORMAL HIGH (ref 65–99)

## 2016-12-03 LAB — CBC WITH DIFFERENTIAL/PLATELET
Basophils Absolute: 0 10*3/uL (ref 0–0.1)
Basophils Relative: 0 %
Eosinophils Absolute: 0 10*3/uL (ref 0–0.7)
Eosinophils Relative: 0 %
HEMATOCRIT: 47.2 % (ref 40.0–52.0)
Hemoglobin: 16.2 g/dL (ref 13.0–18.0)
LYMPHS ABS: 1.4 10*3/uL (ref 1.0–3.6)
LYMPHS PCT: 29 %
MCH: 30.4 pg (ref 26.0–34.0)
MCHC: 34.2 g/dL (ref 32.0–36.0)
MCV: 88.8 fL (ref 80.0–100.0)
MONO ABS: 0.6 10*3/uL (ref 0.2–1.0)
Monocytes Relative: 12 %
NEUTROS ABS: 3 10*3/uL (ref 1.4–6.5)
Neutrophils Relative %: 59 %
Platelets: 225 10*3/uL (ref 150–440)
RBC: 5.32 MIL/uL (ref 4.40–5.90)
RDW: 13.1 % (ref 11.5–14.5)
WBC: 5 10*3/uL (ref 3.8–10.6)

## 2016-12-03 LAB — HEMOGLOBIN A1C
HEMOGLOBIN A1C: 5.6 % (ref 4.8–5.6)
MEAN PLASMA GLUCOSE: 114 mg/dL

## 2016-12-03 LAB — PROTIME-INR
INR: 1.34
PROTHROMBIN TIME: 16.7 s — AB (ref 11.4–15.2)

## 2016-12-03 LAB — LITHIUM LEVEL: Lithium Lvl: 0.83 mmol/L (ref 0.60–1.20)

## 2016-12-03 MED ORDER — WARFARIN SODIUM 10 MG PO TABS
10.0000 mg | ORAL_TABLET | Freq: Every day | ORAL | Status: DC
  Filled 2016-12-03: qty 1

## 2016-12-03 MED ORDER — CLOZAPINE 100 MG PO TABS: 500.0000 mg | ORAL_TABLET | Freq: Every day | ORAL | 0 refills | Status: DC

## 2016-12-03 MED ORDER — WARFARIN - PHYSICIAN DOSING INPATIENT: Freq: Every day | Status: DC

## 2016-12-03 MED ORDER — WARFARIN SODIUM 7.5 MG PO TABS: 7.5000 mg | ORAL_TABLET | Freq: Every day | ORAL | Status: DC

## 2016-12-03 MED ORDER — WARFARIN SODIUM 10 MG PO TABS: 10.0000 mg | ORAL_TABLET | Freq: Every day | ORAL | Status: DC

## 2016-12-03 NOTE — NC FL2 (Addendum)
[place] MEDICAID FL2 LEVEL OF CARE SCREENING TOOL     IDENTIFICATION  Patient Name: Lance [name] Birthdate: 1976/01/22 Sex: male Admission Date (Current Location): 12/01/2016  [place] Number:  Lance [name] [id number] [facility] Facility and Address:  [facility], [address] [place], [place] [address]      Provider Number: [id number]  Attending Physician Name and Address:  Van Clines*  Relative Name and Phone Number:       Current Level of Care: Hospital Recommended Level of Care: Other (Comment) Prior Approval Number:    Date Approved/Denied:   PASRR Number:    Discharge Plan: Other (Comment)    Current Diagnoses: Patient Active Problem List   Diagnosis Date Noted  . Tobacco use disorder 09/01/2016  . Schizoaffective disorder, bipolar type (HCC) 05/22/2016  . Diabetes mellitus without complication (HCC) 05/21/2016  . Hyperlipidemia 05/21/2016  . History of pulmonary embolism 05/21/2016  . Chronic anticoagulation 05/21/2016  . Hypertension 09/25/2015    Orientation RESPIRATION BLADDER Height & Weight     Self, Time, Situation, Place  Normal Continent Weight: 85.3 kg (188 lb) Height:  5\' 10"  (177.8 cm)  BEHAVIORAL SYMPTOMS/MOOD NEUROLOGICAL BOWEL NUTRITION STATUS   (N/A)  (N/A) Continent Diet  AMBULATORY STATUS COMMUNICATION OF NEEDS Skin   Independent Verbally Normal                       Personal Care Assistance Level of Assistance  Bathing, Feeding, Dressing Bathing Assistance: Independent Feeding assistance: Independent Dressing Assistance: Independent     Functional Limitations Info  Sight, Hearing, Speech Sight Info: Adequate Hearing Info: Adequate Speech Info: Adequate    SPECIAL CARE FACTORS FREQUENCY                       Contractures Contractures Info: Not present    Additional Factors Info  Code Status, Allergies Code Status Info: N/A (M/A)             Current Medications  (12/03/2016):  This is the current hospital active medication list Current Facility-Administered Medications  Medication Dose Route Frequency Provider Last Rate Last Dose  . acetaminophen (TYLENOL) tablet 1,000 mg  1,000 mg Oral Q6H PRN Jimmy Footman, MD      . alum & mag hydroxide-simeth (MAALOX/MYLANTA) 200-200-20 MG/5ML suspension 30 mL  30 mL Oral Q4H PRN Clapacs, [name], MD      . benzocaine (ORAJEL) 10 % mucosal gel   Mouth/Throat QID PRN Jimmy Footman, MD      . buPROPion (WELLBUTRIN XL) 24 hr tablet 150 mg  150 mg Oral Daily Clapacs, [name], MD   150 mg at 12/03/16 0847  . cloZAPine (CLOZARIL) tablet 500 mg  500 mg Oral QHS Jimmy Footman, MD   500 mg at 12/02/16 2145  . docusate sodium (COLACE) capsule 100 mg  100 mg Oral BID Jimmy Footman, MD   100 mg at 12/03/16 0800  . insulin aspart (novoLOG) injection 0-5 Units  0-5 Units Subcutaneous QHS Jimmy Footman, MD      . insulin aspart (novoLOG) injection 0-9 Units  0-9 Units Subcutaneous TID WC Jimmy Footman, MD   1 Units at 12/02/16 1617  . insulin detemir (LEVEMIR) injection 14 Units  14 Units Subcutaneous Bryn Gulling, [name], MD   14 Units at 12/03/16 0845  . ipratropium (ATROVENT) 0.06 % nasal spray 1 spray  1 spray Each Nare QHS Clapacs, [name], MD   1  spray at 12/02/16 2145  . lithium carbonate (ESKALITH) CR tablet 450 mg  450 mg Oral Q12H Clapacs, [name], MD   450 mg at 12/03/16 0800  . magnesium hydroxide (MILK OF MAGNESIA) suspension 30 mL  30 mL Oral Daily PRN Clapacs, [name], MD      . metFORMIN (GLUCOPHAGE-XR) 24 hr tablet 500 mg  500 mg Oral BID WC Clapacs, [name], MD   500 mg at 12/03/16 0800  . mometasone-formoterol (DULERA) 200-5 MCG/ACT inhaler 2 puff  2 puff Inhalation BID Clapacs, Jackquline DenmarkJohn T, MD   2 puff at 12/03/16 0843  . nicotine (NICODERM CQ - dosed in mg/24 hours) patch 21 mg  21 mg Transdermal Daily Jimmy FootmanHernandez-Gonzalez, Cassanda Walmer, MD   21 mg at  12/02/16 0825  . omega-3 acid ethyl esters (LOVAZA) capsule 1 g  1 g Oral Daily Clapacs, Jackquline DenmarkJohn T, MD   1 g at 12/03/16 0844  . polyethylene glycol (MIRALAX / GLYCOLAX) packet 17 g  17 g Oral Daily Jimmy FootmanHernandez-Gonzalez, Joncarlo Friberg, MD      . simvastatin (ZOCOR) tablet 40 mg  40 mg Oral QHS Clapacs, Jackquline DenmarkJohn T, MD   40 mg at 12/02/16 2145  . warfarin (COUMADIN) tablet 10 mg  10 mg Oral q1800 Jimmy FootmanHernandez-Gonzalez, Cyanna Neace, MD      . Warfarin - Physician Dosing Inpatient   Does not apply [id number] Jimmy FootmanHernandez-Gonzalez, Thula Stewart, MD      . ziprasidone (GEODON) capsule 40 mg  40 mg Oral BID WC Jimmy FootmanHernandez-Gonzalez, Mckinze Poirier, MD   40 mg at 12/03/16 0800     Discharge Medications: Please see discharge summary for a list of discharge medications.  Relevant Imaging Results:  Relevant Lab Results:   Additional Information    Jimmy FootmanHernandez-Gonzalez,  Velera Lansdale, MD

## 2016-12-03 NOTE — Progress Notes (Signed)
Pt appeared to sleep about 8.5 hours while monitored on 15 minute safety checks. 

## 2016-12-03 NOTE — Plan of Care (Signed)
Problem: Education: Goal: Knowledge of  General Education information/materials will improve Outcome: Progressing Medication ed and sleep hygiene discussed
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
## 2016-12-03 NOTE — Progress Notes (Signed)
D: Pt displays a flat affect, concrete in thinking.  Reports depression but denies s/i, h/i or hallucinations though appears preoccupied at times.  Provided with some education about his current medications and sleep hygiene. A: Provided with education, and support.  Pt monitored on 15 minute safety checks and maintained safety. R: Pt reports he sometimes has difficulty sleeping and asked about sleep aid.  Pt encouraged to discuss with MD in am. Monitor safety and cont tx plan.

## 2016-12-03 NOTE — [facility] Suicide Risk Assessment (Signed)
[facility]   Principal Problem: Schizoaffective disorder, bipolar type [facility](HCC) Discharge Diagnoses:  Patient Active Problem List   Diagnosis Date Noted  . Tobacco use disorder [F17.200] 09/01/2016  . Schizoaffective disorder, bipolar type (HCC) [F25.0] 05/22/2016  . Diabetes mellitus without complication (HCC) [E11.9] 05/21/2016  . Hyperlipidemia [E78.5] 05/21/2016  . History of pulmonary embolism [Z86.711] 05/21/2016  . Chronic anticoagulation [Z79.01] 05/21/2016  . Hypertension [I10] 09/25/2015      Psychiatric Specialty Exam: ROS  Blood pressure (!) 77/58, pulse 87, temperature 98.7 F (37.1 C), temperature source Oral, resp. rate 18, height 5\' 10"  (1.778 m), weight 85.3 kg (188 lb), SpO2 100 %.Body mass index is 26.98 kg/m.                                                       Mental Status Per Nursing Assessment::   On Admission:     Demographic Factors:  Male and Divorced or widowed  Loss Factors: Decrease in vocational status  Historical Factors: Impulsivity  Risk Reduction Factors:   Living with another person, especially a relative and Positive social support  No access to guns  Continued Clinical Symptoms:  Previous Psychiatric Diagnoses and Treatments  Cognitive Features That Contribute To Risk:  Closed-mindedness    Suicide Risk:  Minimal: No identifiable suicidal ideation.  Patients presenting with no risk factors but with morbid ruminations; may be classified as minimal risk based on the severity of the depressive symptoms  Follow-up Information    [facility] [address] [place] [facility].   Why:  [facility] to meet with your doctor for medication management on June 25th at 11AM. If you have any questions, contact [facility] Lead, Liborio NixonJanice. Contact information: [address] [place] [address] [phone]           Jimmy FootmanHernandez-Gonzalez,  Johnthomas Lader,  MD 12/03/2016, 9:54 AM

## 2016-12-03 NOTE — Progress Notes (Signed)
Affect blunted.  Denies SI/HI/AVH.   Discharge instructions given, verbalized understanding.  Peersonal belongings returned to patient. Escorted to main entrance by this Clinical research associatewriter to meet group home owner travel back to group home.  Discharge packet given to group home owner.

## 2016-12-03 NOTE — Progress Notes (Signed)
  [facility] :  Will you be returning to the same living situation after discharge:  Yes,  group home. At discharge, do you have transportation home?: Yes,  group home will pick pt up. Do you have the ability to pay for your medications: Yes,  Medicaid/Medicare  Release of information consent forms completed and in the chart;  Patient's signature needed at discharge.  Patient to Follow up at: Follow-up Information    [facility] [address] [facility] [facility] [place] [facility] [place]/2016.   Why:  [facility] to meet with your doctor for medication management on June 25th at 11AM. If you have any questions, contact [facility] Lead, Liborio NixonJanice. Contact information: [address] [place] [address] [phone]           Next level of care provider has access to [facility] Link:no  Safety Planning and Suicide Prevention discussed: Yes,  w/legal guardian  Have you used any form of tobacco in the last 30 days? (Cigarettes, Smokeless Tobacco, Cigars, and/or Pipes): Yes  Has patient been referred to the Quitline?: Patient refused referral  Patient has been referred for addiction treatment: Pt refused referral  Lynden OxfordKadijah R Burnice Vassel, MSW, LCSW-A 12/03/2016, 10:24 AM

## 2016-12-03 NOTE — [facility] Group Notes (Signed)
[facility] LCSW Group Therapy Note  Type of Therapy and Topic:  Group Therapy:  Goals Group: SMART Goals  Participation Level:  Patient did not attend group. CSW invited patient to group.   Description of Group:   The purpose of a daily goals group is to assist and guide patients in setting recovery/wellness-related goals.  The objective is to set goals as they relate to the crisis in which they were admitted. Patients will be using SMART goal modalities to set measurable goals.  Characteristics of realistic goals will be discussed and patients will be assisted in setting and processing how one will reach their goal. Facilitator will also assist patients in applying interventions and coping skills learned in psycho-education groups to the SMART goal and process how one will achieve defined goal.  Therapeutic Goals: -Patients will develop and document one goal related to or their crisis in which brought them into treatment. -Patients will be guided by LCSW using SMART goal setting modality in how to set a measurable, attainable, realistic and time sensitive goal.  -Patients will process barriers in reaching goal. -Patients will process interventions in how to overcome and successful in reaching goal.   Summary of Patient Progress:  Patient Goal: None identified at this time.    Therapeutic Modalities:   Motivational Interviewing Engineer, manufacturing systemsCognitive Behavioral Therapy Crisis Intervention Model SMART goals setting  Fany Cavanaugh G. Garnette CzechSampson MSW, LCSWA 12/03/2016 11:03 AM

## 2016-12-07 ENCOUNTER — Ambulatory Visit: Payer: Medicare Other | Admitting: Podiatry

## 2016-12-08 DIAGNOSIS — F259 Schizoaffective disorder, unspecified: Secondary | ICD-10-CM | POA: Diagnosis not present

## 2016-12-08 DIAGNOSIS — R45851 Suicidal ideations: Secondary | ICD-10-CM | POA: Diagnosis not present

## 2016-12-08 DIAGNOSIS — R441 Visual hallucinations: Secondary | ICD-10-CM | POA: Diagnosis not present

## 2016-12-15 DIAGNOSIS — Z79899 Other long term (current) drug therapy: Secondary | ICD-10-CM | POA: Diagnosis not present

## 2016-12-15 DIAGNOSIS — F209 Schizophrenia, unspecified: Secondary | ICD-10-CM | POA: Diagnosis not present

## 2016-12-27 ENCOUNTER — Emergency Department
Admission: EM | Admit: 2016-12-27 | Discharge: 2016-12-29 | Disposition: A | Discharge status: Other Acute Inpatient Hospital | Payer: Medicare Other | Attending: Student in an Organized Health Care Education/Training Program | Admitting: Student in an Organized Health Care Education/Training Program

## 2016-12-27 DIAGNOSIS — F319 Bipolar disorder, unspecified: Secondary | ICD-10-CM | POA: Diagnosis present

## 2016-12-27 DIAGNOSIS — F250 Schizoaffective disorder, bipolar type: Secondary | ICD-10-CM | POA: Diagnosis present

## 2016-12-27 DIAGNOSIS — F17210 Nicotine dependence, cigarettes, uncomplicated: Secondary | ICD-10-CM | POA: Insufficient documentation

## 2016-12-27 DIAGNOSIS — F329 Major depressive disorder, single episode, unspecified: Secondary | ICD-10-CM | POA: Diagnosis present

## 2016-12-27 DIAGNOSIS — E785 Hyperlipidemia, unspecified: Secondary | ICD-10-CM | POA: Diagnosis present

## 2016-12-27 DIAGNOSIS — R45851 Suicidal ideations: Secondary | ICD-10-CM | POA: Insufficient documentation

## 2016-12-27 DIAGNOSIS — I10 Essential (primary) hypertension: Secondary | ICD-10-CM | POA: Diagnosis not present

## 2016-12-27 DIAGNOSIS — Z79899 Other long term (current) drug therapy: Secondary | ICD-10-CM | POA: Insufficient documentation

## 2016-12-27 DIAGNOSIS — E119 Type 2 diabetes mellitus without complications: Secondary | ICD-10-CM

## 2016-12-27 DIAGNOSIS — Z7984 Long term (current) use of oral hypoglycemic drugs: Secondary | ICD-10-CM | POA: Diagnosis not present

## 2016-12-27 DIAGNOSIS — Z7901 Long term (current) use of anticoagulants: Secondary | ICD-10-CM

## 2016-12-27 DIAGNOSIS — Z86711 Personal history of pulmonary embolism: Secondary | ICD-10-CM | POA: Diagnosis present

## 2016-12-27 DIAGNOSIS — F259 Schizoaffective disorder, unspecified: Secondary | ICD-10-CM | POA: Diagnosis present

## 2016-12-27 LAB — CBC WITH DIFFERENTIAL/PLATELET
BASOS PCT: 1 %
Basophils Absolute: 0 10*3/uL (ref 0–0.1)
Eosinophils Absolute: 0 10*3/uL (ref 0–0.7)
Eosinophils Relative: 0 %
HEMATOCRIT: 44.5 % (ref 40.0–52.0)
HEMOGLOBIN: 15.6 g/dL (ref 13.0–18.0)
LYMPHS PCT: 28 %
Lymphs Abs: 1.8 10*3/uL (ref 1.0–3.6)
MCH: 31 pg (ref 26.0–34.0)
MCHC: 35 g/dL (ref 32.0–36.0)
MCV: 88.6 fL (ref 80.0–100.0)
MONO ABS: 0.7 10*3/uL (ref 0.2–1.0)
MONOS PCT: 11 %
NEUTROS ABS: 4 10*3/uL (ref 1.4–6.5)
NEUTROS PCT: 60 %
Platelets: 216 10*3/uL (ref 150–440)
RBC: 5.02 MIL/uL (ref 4.40–5.90)
RDW: 13.3 % (ref 11.5–14.5)
WBC: 6.5 10*3/uL (ref 3.8–10.6)

## 2016-12-27 LAB — ETHANOL: Alcohol, Ethyl (B): <5 mg/dL (ref <5)

## 2016-12-27 LAB — COMPREHENSIVE METABOLIC PANEL
ALBUMIN: 4.7 g/dL (ref 3.5–5.0)
ALK PHOS: 55 U/L (ref 38–126)
ALT: 28 U/L (ref 17–63)
ANION GAP: 10 (ref 5–15)
AST: 24 U/L (ref 15–41)
BUN: 21 mg/dL — ABNORMAL HIGH (ref 6–20)
CALCIUM: 9.8 mg/dL (ref 8.9–10.3)
CHLORIDE: 105 mmol/L (ref 101–111)
CO2: 25 mmol/L (ref 22–32)
Creatinine, Ser: 1.19 mg/dL (ref 0.61–1.24)
GFR calc non Af Amer: >60 mL/min (ref >60)
GLUCOSE: 108 mg/dL — AB (ref 65–99)
POTASSIUM: 3.8 mmol/L (ref 3.5–5.1)
Sodium: 140 mmol/L (ref 135–145)
Total Bilirubin: 0.5 mg/dL (ref 0.3–1.2)
Total Protein: 7.2 g/dL (ref 6.5–8.1)

## 2016-12-27 LAB — URINE DRUG SCREEN, QUALITATIVE (ARMC ONLY)
AMPHETAMINES, UR SCREEN: NOT DETECTED
BARBITURATES, UR SCREEN: NOT DETECTED
Benzodiazepine, Ur Scrn: NOT DETECTED
COCAINE METABOLITE, UR ~~LOC~~: NOT DETECTED
Cannabinoid 50 Ng, Ur ~~LOC~~: NOT DETECTED
MDMA (ECSTASY) UR SCREEN: NOT DETECTED
Methadone Scn, Ur: NOT DETECTED
OPIATE, UR SCREEN: NOT DETECTED
Phencyclidine (PCP) Ur S: NOT DETECTED
TRICYCLIC, UR SCREEN: NOT DETECTED

## 2016-12-27 LAB — SALICYLATE LEVEL: Salicylate Lvl: <7 mg/dL (ref 2.8–30.0)

## 2016-12-27 LAB — PROTIME-INR
INR: 2.22
PROTHROMBIN TIME: 25 s — AB (ref 11.4–15.2)

## 2016-12-27 LAB — ACETAMINOPHEN LEVEL

## 2016-12-27 MED ORDER — CLOZAPINE 100 MG PO TABS
500.0000 mg | ORAL_TABLET | Freq: Every day | ORAL | Status: DC
  Administered 2016-12-27 – 2016-12-28 (×2): 500 mg via ORAL
  Filled 2016-12-27 (×2): qty 5

## 2016-12-27 MED ORDER — INSULIN DETEMIR 100 UNIT/ML ~~LOC~~ SOLN
14.0000 [IU] | SUBCUTANEOUS | Status: DC
  Administered 2016-12-28 – 2016-12-29 (×2): 14 [IU] via SUBCUTANEOUS
  Filled 2016-12-27 (×3): qty 0.14

## 2016-12-27 MED ORDER — METFORMIN HCL ER 500 MG PO TB24
500.0000 mg | ORAL_TABLET | Freq: Two times a day (BID) | ORAL | Status: DC
  Administered 2016-12-28 – 2016-12-29 (×4): 500 mg via ORAL
  Filled 2016-12-27 (×4): qty 1

## 2016-12-27 MED ORDER — BUPROPION HCL ER (XL) 150 MG PO TB24
150.0000 mg | ORAL_TABLET | Freq: Every day | ORAL | Status: DC
  Administered 2016-12-27 – 2016-12-29 (×3): 150 mg via ORAL
  Filled 2016-12-27 (×4): qty 1

## 2016-12-27 MED ORDER — LITHIUM CARBONATE ER 450 MG PO TBCR
450.0000 mg | EXTENDED_RELEASE_TABLET | Freq: Two times a day (BID) | ORAL | Status: DC
  Administered 2016-12-27 – 2016-12-29 (×4): 450 mg via ORAL
  Filled 2016-12-27 (×4): qty 1

## 2016-12-27 NOTE — ED Provider Notes (Signed)
[facility] Emergency Department Provider Note    First MD Initiated Contact with Patient 12/27/16 2133     (approximate)  I have reviewe the triage vital signs and the nursing notes.   HISTORY  Chief Complaint Behavior Problem    HPI Lance [name] is a 41 y.o. male with extensive psychiatric illness presents from his group home with several days of suicidal ideations. Patient currently tells me that he plans to overdose on his medications. It does seem that symptoms are worse when talking about his place in his group home and patient is well-known to this facility for similar symptoms. Patient does however appear a bit more agitated and slightly manic as compared to previous times on review of records. States that he's been compliant with his medications but is not very cooperative with the exam and history.   Past Medical History:  Diagnosis Date  . Depression   . Diabetes mellitus without complication (HCC)   . Hyperlipidemia   . Hypertension   . Lupus anticoagulant disorder (HCC) 06/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  . PE (pulmonary thromboembolism) (HCC)   . Schizo affective schizophrenia (HCC)   . Supratherapeutic INR 11/19/2016   Family History  Problem Relation Age of Onset  . CAD Mother   . CAD Sister    History reviewed. No pertinent surgical history. Patient Active Problem List   Diagnosis Date Noted  . Tobacco use disorder 09/01/2016  . Schizoaffective disorder, bipolar type (HCC) 05/22/2016  . Diabetes mellitus without complication (HCC) 05/21/2016  . Hyperlipidemia 05/21/2016  . History of pulmonary embolism 05/21/2016  . Chronic anticoagulation 05/21/2016  . Hypertension 09/25/2015      Prior to Admission medications   Medication Sig Start Date End Date Taking? Authorizing Provider  buPROPion (WELLBUTRIN XL) 150 MG 24 hr tablet Take 1 tablet (150 mg total) by mouth daily. 09/15/16   Jimmy Footman, MD    cloZAPine (CLOZARIL) 100 MG tablet Take 5 tablets (500 mg total) by mouth at bedtime. 12/03/16   Jimmy Footman, MD  docusate sodium (COLACE) 100 MG capsule Take 2 capsules (200 mg total) by mouth 2 (two) times daily. 09/14/16   Jimmy Footman, MD  insulin detemir (LEVEMIR) 100 UNIT/ML injection Inject 14 Units into the skin every morning.    [provider]  ipratropium (ATROVENT) 0.06 % nasal spray Place 1 spray into both nostrils at bedtime. 09/14/16   Jimmy Footman, MD  lithium carbonate (ESKALITH) 450 MG CR tablet Take 1 tablet (450 mg total) by mouth every 12 (twelve) hours. 09/14/16   Jimmy Footman, MD  metformin (FORTAMET) 500 MG (OSM) 24 hr tablet Take 500 mg by mouth 2 (two) times daily with a meal.    [provider]  mometasone-formoterol (DULERA) 200-5 MCG/ACT AERO Inhale 2 puffs into the lungs 2 (two) times daily. 11/20/16   Altamese Dilling, MD  omega-3 acid ethyl esters (LOVAZA) 1 g capsule Take 1 g by mouth daily.    [provider]  polyethylene glycol (MIRALAX / GLYCOLAX) packet Take 17 g by mouth daily. 09/15/16   Jimmy Footman, MD  simvastatin (ZOCOR) 40 MG tablet Take 40 mg by mouth at bedtime.    [provider]  warfarin (COUMADIN) 10 MG tablet Take 1 tablet (10 mg total) by mouth daily at 6 PM. As prescribed by Dr Harl Bowie on 6/1 12/03/16   Jimmy Footman, MD    Allergies Penicillins    Social History  Social History  Substance Use Topics  . Smoking status: Current Every Day Smoker    Packs/day: 1.00    Years: 23.00    Types: Cigarettes  . Smokeless tobacco: Never Used  . Alcohol use No     Comment: occassionally    Review of Systems Patient denies headaches, rhinorrhea, blurry vision, numbness, shortness of breath, chest pain, edema, cough, abdominal pain, nausea, vomiting, diarrhea, dysuria, fevers, rashes or hallucinations unless otherwise stated above in  HPI. ____________________________________________   PHYSICAL EXAM:  VITAL SIGNS: Vitals:   12/27/16 1957  BP: 106/71  Pulse: (!) 104  Resp: 18  Temp: 98.4 F (36.9 C)    Constitutional: Alert and oriented. Eyes: Conjunctivae are normal.  Head: Atraumatic. Nose: No congestion/rhinnorhea. Mouth/Throat: Mucous membranes are moist.   Neck: No stridor. Painless ROM.  Cardiovascular: Normal rate, regular rhythm. Grossly normal heart sounds.  Good peripheral circulation. Respiratory: Normal respiratory effort.  No retractions. Lungs CTAB. Gastrointestinal: Soft and nontender. No distention. No abdominal bruits. No CVA tenderness. Musculoskeletal: No lower extremity tenderness nor edema.  No joint effusions. Neurologic:  Normal speech and language. No gross focal neurologic deficits are appreciated. No facial droop Skin:  Skin is warm, dry and intact. No rash noted. Psychiatric: agitated, pressured speech, SI  ____________________________________________   LABS (all labs ordered are listed, but only abnormal results are displayed)  Results for orders placed or performed during the hospital encounter of 12/27/16 (from the past 24 hour(s))  Comprehensive metabolic panel     Status: Abnormal   Collection Time: 12/27/16  7:58 PM  Result Value Ref Range   Sodium 140 135 - 145 mmol/L   Potassium 3.8 3.5 - 5.1 mmol/L   Chloride 105 101 - 111 mmol/L   CO2 25 22 - 32 mmol/L   Glucose, Bld 108 (H) 65 - 99 mg/dL   BUN 21 (H) 6 - 20 mg/dL   Creatinine, Ser 1.91 0.61 - 1.24 mg/dL   Calcium 9.8 8.9 - 47.8 mg/dL   Total Protein 7.2 6.5 - 8.1 g/dL   Albumin 4.7 3.5 - 5.0 g/dL   AST 24 15 - 41 U/L   ALT 28 17 - 63 U/L   Alkaline Phosphatase 55 38 - 126 U/L   Total Bilirubin 0.5 0.3 - 1.2 mg/dL   GFR calc non Af Amer >60 >60 mL/min   GFR calc Af Amer >60 >60 mL/min   Anion gap 10 5 - 15  Ethanol     Status: None   Collection Time: 12/27/16  7:58 PM  Result Value Ref Range   Alcohol,  Ethyl (B) <5 <5 mg/dL  Urine Drug Screen, Qualitative     Status: None   Collection Time: 12/27/16  7:58 PM  Result Value Ref Range   Tricyclic, Ur Screen NONE DETECTED NONE DETECTED   Amphetamines, Ur Screen NONE DETECTED NONE DETECTED   MDMA (Ecstasy)Ur Screen NONE DETECTED NONE DETECTED   Cocaine Metabolite,Ur [place] NONE DETECTED NONE DETECTED   Opiate, Ur Screen NONE DETECTED NONE DETECTED   Phencyclidine (PCP) Ur S NONE DETECTED NONE DETECTED   Cannabinoid 50 Ng, Ur [place] NONE DETECTED NONE DETECTED   Barbiturates, Ur Screen NONE DETECTED NONE DETECTED   Benzodiazepine, Ur Scrn NONE DETECTED NONE DETECTED   Methadone Scn, Ur NONE DETECTED NONE DETECTED  CBC with Diff     Status: None   Collection Time: 12/27/16  7:58 PM  Result Value Ref Range   WBC 6.5 3.8 - 10.6 K/uL  RBC 5.02 4.40 - 5.90 MIL/uL   Hemoglobin 15.6 13.0 - 18.0 g/dL   HCT 09.844.5 11.940.0 - 14.752.0 %   MCV 88.6 80.0 - 100.0 fL   MCH 31.0 26.0 - 34.0 pg   MCHC 35.0 32.0 - 36.0 g/dL   RDW 82.913.3 56.211.5 - 13.014.5 %   Platelets 216 150 - 440 K/uL   Neutrophils Relative % 60 %   Neutro Abs 4.0 1.4 - 6.5 K/uL   Lymphocytes Relative 28 %   Lymphs Abs 1.8 1.0 - 3.6 K/uL   Monocytes Relative 11 %   Monocytes Absolute 0.7 0.2 - 1.0 K/uL   Eosinophils Relative 0 %   Eosinophils Absolute 0.0 0 - 0.7 K/uL   Basophils Relative 1 %   Basophils Absolute 0.0 0 - 0.1 K/uL  Salicylate level     Status: None   Collection Time: 12/27/16  7:58 PM  Result Value Ref Range   Salicylate Lvl <7.0 2.8 - 30.0 mg/dL  Acetaminophen level     Status: Abnormal   Collection Time: 12/27/16  7:58 PM  Result Value Ref Range   Acetaminophen (Tylenol), Serum <10 (L) 10 - 30 ug/mL   ____________________________________________  ____________________________________________  RADIOLOGY   ____________________________________________   PROCEDURES  Procedure(s) performed:  Procedures    Critical Care performed:  no ____________________________________________   INITIAL IMPRESSION / ASSESSMENT AND PLAN / ED COURSE  Pertinent labs & imaging results that were available during my care of the patient were reviewed by me and considered in my medical decision making (see chart for details).  DDX: Psychosis, delirium, medication effect, noncompliance, polysubstance abuse, Si, Hi, depression   Rhodia AlbrightJames P Brandner is a 41 y.o. who presents to the ED with for evaluation of SI.  Patient has psych history of depression and schizofrphenia.  Laboratory testing was ordered to evaluation for underlying electrolyte derangement or signs of underlying organic pathology to explain today's presentation.  Based on history and physical and laboratory evaluation, it appears that the patient's presentation is 2/2 underlying psychiatric disorder and will require further evaluation and management by inpatient psychiatry.  Patient was  made an IVC due to SI.  Disposition pending psychiatric evaluation.       ____________________________________________   FINAL CLINICAL IMPRESSION(S) / ED DIAGNOSES  Final diagnoses:  Suicidal ideations      NEW MEDICATIONS STARTED DURING THIS VISIT:  New Prescriptions   No medications on file     Note:  This document was prepared using Dragon voice recognition software and may include unintentional dictation errors.    Willy Eddyobinson, Daeveon Zweber, MD 12/27/16 2146

## 2016-12-27 NOTE — ED Notes (Signed)
Report given to Margaret, RN in ED BHU.  

## 2016-12-27 NOTE — ED Notes (Signed)

## 2016-12-27 NOTE — ED Triage Notes (Signed)
Patient ongoing suicidal ideation without a plan. States they are not getting any better. Denies any altercations at his group home. [facility] [place]. Patient presents with [place] [employer] but has no IVC papers in place. Patient is pleasant and cooperative at this time.

## 2016-12-27 NOTE — Progress Notes (Signed)
Pharmacy note Clozapine monitoring. 7/15 ANC 4.0. Lab entered on [facility] website. Patient eligible to receive.  Fulton ReekMatt Vaishnav Demartin, PharmD, BCPS  12/27/16 10:20 PM

## 2016-12-27 NOTE — ED Notes (Signed)
BEHAVIORAL HEALTH ROUNDING  Patient sleeping: No.  Patient alert and oriented: yes  Behavior appropriate: Yes. ; If no, describe:  Nutrition and fluids offered: Yes  Toileting and hygiene offered: Yes  Sitter present: not applicable, Q 15 min safety rounds and observation.  Law enforcement present: Yes ODS  

## 2016-12-27 NOTE — ED Notes (Signed)
Pt. To BHU from ED ambulatory without difficulty, to room  BHU 6. Pt. Is alert and oriented, warm and dry in no distress. Pt. Denies HI, and AVH. Pt states having SI but has no plan. Pt able to contract for safety. Pt. Calm and cooperative. Pt. Made aware of security cameras and Q15 minute rounds. Pt. Encouraged to let Nursing staff know of any concerns or needs.

## 2016-12-27 NOTE — ED Notes (Signed)
Pt reports he has been having SI since being at the group home he is at now. Pt reports he has lived there for about 4 years. Pt reports he has no plan to harm himself and just wants to be left alone. Pt states "I don't feel like talking right now". Explained to patient that he would have to answer questions to allow us to help him.

## 2016-12-28 DIAGNOSIS — Z5181 Encounter for therapeutic drug level monitoring: Secondary | ICD-10-CM | POA: Diagnosis not present

## 2016-12-28 DIAGNOSIS — F250 Schizoaffective disorder, bipolar type: Secondary | ICD-10-CM

## 2016-12-28 LAB — GLUCOSE, CAPILLARY
GLUCOSE-CAPILLARY: 108 mg/dL — AB (ref 65–99)
GLUCOSE-CAPILLARY: 96 mg/dL (ref 65–99)

## 2016-12-28 MED ORDER — WARFARIN - PHARMACIST DOSING INPATIENT
Freq: Every day | Status: DC
  Administered 2016-12-28: 18:00:00
  Filled 2016-12-28 (×3): qty 1

## 2016-12-28 MED ORDER — TRAZODONE HCL 100 MG PO TABS
100.0000 mg | ORAL_TABLET | Freq: Every day | ORAL | Status: DC
  Administered 2016-12-28: 100 mg via ORAL
  Filled 2016-12-28: qty 1

## 2016-12-28 MED ORDER — WARFARIN SODIUM 10 MG PO TABS
10.0000 mg | ORAL_TABLET | Freq: Every day | ORAL | Status: DC
  Administered 2016-12-28: 10 mg via ORAL
  Filled 2016-12-28: qty 1

## 2016-12-28 MED ORDER — ONDANSETRON 4 MG PO TBDP
4.0000 mg | ORAL_TABLET | Freq: Once | ORAL | Status: AC
  Administered 2016-12-28: 4 mg via ORAL

## 2016-12-28 MED ORDER — ONDANSETRON 4 MG PO TBDP
ORAL_TABLET | ORAL | Status: AC
  Administered 2016-12-28: 4 mg via ORAL
  Filled 2016-12-28: qty 1

## 2016-12-28 NOTE — ED Notes (Signed)
Pt compliant with EKG. No needs or concerns at this time. Pt resting comfortably, TV on. Maintained on 15 minute checks and observation by security camera for safety.

## 2016-12-28 NOTE — BH Assessment (Signed)
Referral information for Psychiatric Hospitalization faxed to;    [facility] ([phone] or [phone])   [facility] ([phone]),    [facility] ([phone]),    [facility] ([phone]),    [facility] ([phone]),    [facility] ([phone]).  

## 2016-12-28 NOTE — Consult Note (Signed)
[facility] Consult   Reason for Consult:  Consult for 41 year old man with a history of schizophrenia who comes to the emergency room claiming to be having "bad thoughts" Referring Physician:  Eual Fines Patient Identification: Lance [name] MRN:  [id number] Principal Diagnosis: Schizoaffective disorder, bipolar type [facility]) Diagnosis:   Patient Active Problem List   Diagnosis Date Noted  . Tobacco use disorder [F17.200] 09/01/2016  . Schizoaffective disorder, bipolar type ([place]) [F25.0] 05/22/2016  . Diabetes mellitus without complication ([place]) [N82.9] 05/21/2016  . Hyperlipidemia [E78.5] 05/21/2016  . History of pulmonary embolism [Z86.711] 05/21/2016  . Chronic anticoagulation [Z79.01] 05/21/2016  . Hypertension [I10] 09/25/2015    Total Time spent with patient: 1 hour  Subjective:   Lance [name] is a 41 y.o. male patient admitted with "I've been suicidal".  HPI:  Patient interviewed chart reviewed. Patient known to me from many prior encounters. 41 year old man with a history of chronic psychotic disorder had himself brought to the emergency room stating that he's been feeling more depressed and suicidal. He says that he is consumed by negative thoughts. Thinks about wishing he were dead and thinks about walking into traffic. Stresses include acutely having a roommate that he dislikes at his group home but on a more long-term basis he is stressed out by his general existetial ennui about his life and how he feels empty. Patient says that he feels tired and run down. Doesn't feel like he has anything positive in his life. He says he still frequently has vague visual and auditory hallucinations despite compliance with medicine. Denies that he is abusing any drugs or alcohol.  Social history: Patient resides in a group home. I believe he has a guardian. He doesn't seem to have any family that are still involved with his life.  Medical history: Pretty significant medical  problems for someone his age. He has diabetes that requires insulin. High blood pressure. He is also on Coumadin and is chronically anticoagulated because of his history of pulmonary embolisms which are related to an underlying clotting disorder. Payton Mccallum  Substance abuse history: Used to smoke weed fairly often but that seems to not be an issue anymore.  Past Psychiatric History: Patient has a long history of psychotic disorder diagnosed either as schizoaffective or schizophrenia. He is currently on clozapine and lithium which seems to of done a reasonable job at controlling his symptoms. In the past when not medicated he has had periods of getting extremely delusional and bizarre. Right now he seems to be less acutely psychotic and more depressed. He does have a history of suicidal threats and suicide attempts in the past. Has not been severely violent. He does have [employer] act team regularly  Risk to Self: Is patient at risk for suicide?: No Risk to Others:   Prior Inpatient Therapy:   Prior Outpatient Therapy:    Past Medical History:  Past Medical History:  Diagnosis Date  . Depression   . Diabetes mellitus without complication ([place])   . Hyperlipidemia   . Hypertension   . Lupus anticoagulant disorder ([place]) 06/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  . PE (pulmonary thromboembolism) ([place])   . Schizo affective schizophrenia ([place])   . Supratherapeutic INR 11/19/2016   History reviewed. No pertinent surgical history. Family History:  Family History  Problem Relation Age of Onset  . CAD Mother   . CAD Sister    Family Psychiatric  History: Does not know of any Social History:  History  Alcohol Use No    Comment: occassionally     History  Drug Use  . Types: Marijuana    Comment: Last use 11/29/16    Social History   Social History  . Marital status: Single    Spouse name: N/A  . Number of children: N/A  . Years of education: N/A   Social History Main Topics  .  Smoking status: Current Every Day Smoker    Packs/day: 1.00    Years: 23.00    Types: Cigarettes  . Smokeless tobacco: Never Used  . Alcohol use No     Comment: occassionally  . Drug use: Yes    Types: Marijuana     Comment: Last use 11/29/16  . Sexual activity: No     Comment: occasional marijuana- none recently   Other Topics Concern  . None   Social History Narrative   From a group home in [place]   Additional Social History:    Allergies:   Allergies  Allergen Reactions  . Penicillins Other (See Comments)    Reaction: "lockjaw" Has patient had a PCN reaction causing immediate rash, facial/tongue/throat swelling, SOB or lightheadedness with hypotension: No Has patient had a PCN reaction causing severe rash involving mucus membranes or skin necrosis: No Has patient had a PCN reaction that required hospitalization: No Has patient had a PCN reaction occurring within the last 10 years: No If all of the above answers are "NO", then may proceed with Cephalosporin use.     Labs:  Results for orders placed or performed during the hospital encounter of 12/27/16 (from the past 48 hour(s))  Comprehensive metabolic panel     Status: Abnormal   Collection Time: 12/27/16  7:58 PM  Result Value Ref Range   Sodium 140 135 - 145 mmol/L   Potassium 3.8 3.5 - 5.1 mmol/L   Chloride 105 101 - 111 mmol/L   CO2 25 22 - 32 mmol/L   Glucose, Bld 108 (H) 65 - 99 mg/dL   BUN 21 (H) 6 - 20 mg/dL   Creatinine, Ser 1.19 0.61 - 1.24 mg/dL   Calcium 9.8 8.9 - 10.3 mg/dL   Total Protein 7.2 6.5 - 8.1 g/dL   Albumin 4.7 3.5 - 5.0 g/dL   AST 24 15 - 41 U/L   ALT 28 17 - 63 U/L   Alkaline Phosphatase 55 38 - 126 U/L   Total Bilirubin 0.5 0.3 - 1.2 mg/dL   GFR calc non Af Amer >60 >60 mL/min   GFR calc Af Amer >60 >60 mL/min    Comment: (NOTE) The eGFR has been calculated using the CKD EPI equation. This calculation has not been validated in all clinical situations. eGFR's persistently  <60 mL/min signify possible Chronic Kidney Disease.    Anion gap 10 5 - 15  Ethanol     Status: None   Collection Time: 12/27/16  7:58 PM  Result Value Ref Range   Alcohol, Ethyl (B) <5 <5 mg/dL    Comment:        LOWEST DETECTABLE LIMIT FOR SERUM ALCOHOL IS 5 mg/dL FOR MEDICAL PURPOSES ONLY   Urine Drug Screen, Qualitative     Status: None   Collection Time: 12/27/16  7:58 PM  Result Value Ref Range   Tricyclic, Ur Screen NONE DETECTED NONE DETECTED   Amphetamines, Ur Screen NONE DETECTED NONE DETECTED   MDMA (Ecstasy)Ur Screen NONE DETECTED NONE DETECTED   Cocaine Metabolite,Ur  NONE DETECTED NONE DETECTED  Opiate, Ur Screen NONE DETECTED NONE DETECTED   Phencyclidine (PCP) Ur S NONE DETECTED NONE DETECTED   Cannabinoid 50 Ng, Ur [place] NONE DETECTED NONE DETECTED   Barbiturates, Ur Screen NONE DETECTED NONE DETECTED   Benzodiazepine, Ur Scrn NONE DETECTED NONE DETECTED   Methadone Scn, Ur NONE DETECTED NONE DETECTED    Comment: (NOTE) 474  Tricyclics, urine               Cutoff 1000 ng/mL 200  Amphetamines, urine             Cutoff 1000 ng/mL 300  MDMA (Ecstasy), urine           Cutoff 500 ng/mL 400  Cocaine Metabolite, urine       Cutoff 300 ng/mL 500  Opiate, urine                   Cutoff 300 ng/mL 600  Phencyclidine (PCP), urine      Cutoff 25 ng/mL 700  Cannabinoid, urine              Cutoff 50 ng/mL 800  Barbiturates, urine             Cutoff 200 ng/mL 900  Benzodiazepine, urine           Cutoff 200 ng/mL 1000 Methadone, urine                Cutoff 300 ng/mL 1100 1200 The urine drug screen provides only a preliminary, unconfirmed 1300 analytical test result and should not be used for non-medical 1400 purposes. Clinical consideration and professional judgment should 1500 be applied to any positive drug screen result due to possible 1600 interfering substances. A more specific alternate chemical method 1700 must be used in order to obtain a confirmed analytical  result.  1800 Gas chromato graphy / mass spectrometry (GC/MS) is the preferred 1900 confirmatory method.   CBC with Diff     Status: None   Collection Time: 12/27/16  7:58 PM  Result Value Ref Range   WBC 6.5 3.8 - 10.6 K/uL   RBC 5.02 4.40 - 5.90 MIL/uL   Hemoglobin 15.6 13.0 - 18.0 g/dL   HCT 44.5 40.0 - 52.0 %   MCV 88.6 80.0 - 100.0 fL   MCH 31.0 26.0 - 34.0 pg   MCHC 35.0 32.0 - 36.0 g/dL   RDW 13.3 11.5 - 14.5 %   Platelets 216 150 - 440 K/uL   Neutrophils Relative % 60 %   Neutro Abs 4.0 1.4 - 6.5 K/uL   Lymphocytes Relative 28 %   Lymphs Abs 1.8 1.0 - 3.6 K/uL   Monocytes Relative 11 %   Monocytes Absolute 0.7 0.2 - 1.0 K/uL   Eosinophils Relative 0 %   Eosinophils Absolute 0.0 0 - 0.7 K/uL   Basophils Relative 1 %   Basophils Absolute 0.0 0 - 0.1 K/uL  Salicylate level     Status: None   Collection Time: 12/27/16  7:58 PM  Result Value Ref Range   Salicylate Lvl <2.5 2.8 - 30.0 mg/dL  Acetaminophen level     Status: Abnormal   Collection Time: 12/27/16  7:58 PM  Result Value Ref Range   Acetaminophen (Tylenol), Serum <10 (L) 10 - 30 ug/mL    Comment:        THERAPEUTIC CONCENTRATIONS VARY SIGNIFICANTLY. A RANGE OF 10-30 ug/mL MAY BE AN EFFECTIVE CONCENTRATION FOR MANY PATIENTS. HOWEVER, SOME ARE BEST TREATED AT CONCENTRATIONS OUTSIDE THIS RANGE.  ACETAMINOPHEN CONCENTRATIONS >150 ug/mL AT 4 HOURS AFTER INGESTION AND >50 ug/mL AT 12 HOURS AFTER INGESTION ARE OFTEN ASSOCIATED WITH TOXIC REACTIONS.   Protime-INR     Status: Abnormal   Collection Time: 12/27/16  7:58 PM  Result Value Ref Range   Prothrombin Time 25.0 (H) 11.4 - 15.2 seconds   INR 2.22   Glucose, capillary     Status: Abnormal   Collection Time: 12/28/16  2:07 AM  Result Value Ref Range   Glucose-Capillary 108 (H) 65 - 99 mg/dL  Glucose, capillary     Status: None   Collection Time: 12/28/16  5:47 AM  Result Value Ref Range   Glucose-Capillary 96 65 - 99 mg/dL    Current  Facility-Administered Medications  Medication Dose Route Frequency Provider Last Rate Last Dose  . buPROPion (WELLBUTRIN XL) 24 hr tablet 150 mg  150 mg Oral Daily Merlyn Lot, MD   150 mg at 12/28/16 0951  . cloZAPine (CLOZARIL) tablet 500 mg  500 mg Oral QHS Merlyn Lot, MD   500 mg at 12/27/16 2250  . insulin detemir (LEVEMIR) injection 14 Units  14 Units Subcutaneous Particia Nearing, MD   14 Units at 12/28/16 [phone]  . lithium carbonate (ESKALITH) CR tablet 450 mg  450 mg Oral Q12H Merlyn Lot, MD   450 mg at 12/28/16 0951  . metFORMIN (GLUCOPHAGE-XR) 24 hr tablet 500 mg  500 mg Oral BID WC Merlyn Lot, MD   500 mg at 12/28/16 0951  . warfarin (COUMADIN) tablet 10 mg  10 mg Oral q1800 Merlyn Lot, MD      . Warfarin - Pharmacist Dosing Inpatient   Does not apply q1800 Merlyn Lot, MD       Current Outpatient Prescriptions  Medication Sig Dispense Refill  . buPROPion (WELLBUTRIN XL) 150 MG 24 hr tablet Take 1 tablet (150 mg total) by mouth daily. 30 tablet 0  . cloZAPine (CLOZARIL) 100 MG tablet Take 5 tablets (500 mg total) by mouth at bedtime. 150 tablet 0  . docusate sodium (COLACE) 100 MG capsule Take 2 capsules (200 mg total) by mouth 2 (two) times daily. 120 capsule 0  . insulin detemir (LEVEMIR) 100 UNIT/ML injection Inject 14 Units into the skin every morning.    Marland Kitchen ipratropium (ATROVENT) 0.06 % nasal spray Place 1 spray into both nostrils at bedtime. 3 mL 0  . lithium carbonate (ESKALITH) 450 MG CR tablet Take 1 tablet (450 mg total) by mouth every 12 (twelve) hours. 60 tablet 0  . metformin (FORTAMET) 500 MG (OSM) 24 hr tablet Take 500 mg by mouth 2 (two) times daily with a meal.    . mometasone-formoterol (DULERA) 200-5 MCG/ACT AERO Inhale 2 puffs into the lungs 2 (two) times daily. 1 Inhaler 1  . omega-3 acid ethyl esters (LOVAZA) 1 g capsule Take 1 g by mouth daily.    . polyethylene glycol (MIRALAX / GLYCOLAX) packet Take 17 g by mouth  daily. 30 each 0  . simvastatin (ZOCOR) 40 MG tablet Take 40 mg by mouth at bedtime.    Marland Kitchen warfarin (COUMADIN) 10 MG tablet Take 1 tablet (10 mg total) by mouth daily at 6 PM. As prescribed by Dr Rebecka Apley on 6/1      Musculoskeletal: Strength & Muscle Tone: within normal limits Gait & Station: normal Patient leans: N/A  Psychiatric Specialty Exam: Physical Exam  Nursing note and vitals reviewed. Constitutional: He appears well-developed and well-nourished.  HENT:  Head: Normocephalic and atraumatic.  Eyes: Pupils are equal, round, and reactive to light. Conjunctivae are normal.  Neck: Normal range of motion.  Cardiovascular: Regular rhythm and normal heart sounds.   Respiratory: Effort normal. No respiratory distress.  GI: Soft.  Musculoskeletal: Normal range of motion.  Neurological: He is alert.  Skin: Skin is warm and dry.  Psychiatric: His affect is blunt. His speech is delayed. He is slowed and withdrawn. Thought content is paranoid. Cognition and memory are impaired. He expresses impulsivity. He exhibits a depressed mood. He expresses suicidal ideation. He expresses no homicidal ideation. He exhibits abnormal recent memory.    Review of Systems  Constitutional: Negative.   HENT: Negative.   Eyes: Negative.   Respiratory: Negative.   Cardiovascular: Negative.   Gastrointestinal: Negative.   Musculoskeletal: Negative.   Skin: Negative.   Neurological: Negative.   Psychiatric/Behavioral: Positive for depression, hallucinations, memory loss and suicidal ideas. Negative for substance abuse. The patient is nervous/anxious and has insomnia.     Blood pressure 118/72, pulse 87, temperature 98.5 F (36.9 C), temperature source Oral, resp. rate 16, height '5\' 11"'  (1.803 m), weight 210 lb (95.3 kg), SpO2 98 %.Body mass index is 29.29 kg/m.  General Appearance: Disheveled  Eye Contact:  Minimal  Speech:  Slow  Volume:  Decreased  Mood:  Depressed and Dysphoric  Affect:  Congruent   Thought Process:  Goal Directed  Orientation:  Full (Time, Place, and Person)  Thought Content:  Rumination and Tangential  Suicidal Thoughts:  Yes.  with intent/plan  Homicidal Thoughts:  No  Memory:  Immediate;   Good Recent;   Fair Remote;   Fair  Judgement:  Impaired  Insight:  Shallow  Psychomotor Activity:  Decreased  Concentration:  Concentration: Fair  Recall:  [employer] of Knowledge:  Fair  Language:  Fair  Akathisia:  No  Handed:  Right  AIMS (if indicated):     Assets:  Communication Skills Desire for Improvement Financial Resources/Insurance Housing Resilience Social Support  ADL's:  Intact  Cognition:  Impaired,  Mild  Sleep:        Treatment Plan Summary: Daily contact with patient to assess and evaluate symptoms and progress in treatment, Medication management and Plan 41 year old man with chronic mental health issues comes back to the emergency room claiming that he is suicidal. He does seem very sad and run down and does not appear to have the capacity to think is way out of it. He has significant risk of suicidality. Patient will be readmitted to the psychiatric unit. Continue outpatient medicine as previously. Engage patient in individual and group therapy activities. Monitor his anticoagulation and his blood sugars.  Disposition: Recommend psychiatric Inpatient admission when medically cleared. Supportive therapy provided about ongoing stressors.  Alethia Berthold, MD 12/28/2016 12:50 PM

## 2016-12-28 NOTE — ED Notes (Signed)
Breakfast tray placed in patient's room. Patient sleeping. Maintained on 15 minute checks and observation by security camera for safety.

## 2016-12-28 NOTE — ED Notes (Signed)
Patient actively vomiting in restroom. Diet ginger ale and pack of saltine given and directed patient to only take small sips and nibble on a cracker.

## 2016-12-28 NOTE — ED Notes (Signed)

## 2016-12-28 NOTE — ED Notes (Signed)
Pt. Alert and oriented, warm and dry, in no distress. Pt. Denies SI, HI, and AVH. Pt. Encouraged to let nursing staff know of any concerns or needs. 

## 2016-12-28 NOTE — Progress Notes (Signed)
ANTICOAGULATION CONSULT NOTE - Initial Consult  Pharmacy Consult for warfarin dosing Indication: pulmonary embolus  Allergies  Allergen Reactions  . Penicillins Other (See Comments)    Reaction: "lockjaw" Has patient had a PCN reaction causing immediate rash, facial/tongue/throat swelling, SOB or lightheadedness with hypotension: No Has patient had a PCN reaction causing severe rash involving mucus membranes or skin necrosis: No Has patient had a PCN reaction that required hospitalization: No Has patient had a PCN reaction occurring within the last 10 years: No If all of the above answers are "NO", then may proceed with Cephalosporin use.     Patient Measurements: Height: 5\' 11"  (180.3 cm) Weight: 210 lb (95.3 kg) IBW/kg (Calculated) : 75.3 Heparin Dosing Weight: n/a  Vital Signs: Temp: 98.4 F (36.9 C) (07/15 1957) Temp Source: Oral (07/15 1957) BP: 106/71 (07/15 1957) Pulse Rate: 104 (07/15 1957)  Labs:  Recent Labs  12/27/16 1958  HGB 15.6  HCT 44.5  PLT 216  LABPROT 25.0*  INR 2.22  CREATININE 1.19    Estimated Creatinine Clearance: 95.3 mL/min (by C-G formula based on SCr of 1.19 mg/dL).   Medical History: Past Medical History:  Diagnosis Date  . Depression   . Diabetes mellitus without complication ([facility])   . Hyperlipidemia   . Hypertension   . Lupus anticoagulant disorder ([facility]) 06/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  . PE (pulmonary thromboembolism) ([facility])   . Schizo affective schizophrenia ([facility])   . Supratherapeutic INR 11/19/2016    Medications:  Home dose of warfarin 10 mg.  Assessment: INR therapeutic on admission  Goal of Therapy:  INR 2-3    Plan:  Continue home regimen of 10 mg warfarin daily.  Brookelynn Hamor S 12/28/2016,12:33 AM

## 2016-12-28 NOTE — ED Notes (Signed)
Sandwich and soft drink given.  

## 2016-12-28 NOTE — ED Notes (Signed)
Patient asleep in room. No noted distress or abnormal behavior. Will continue 15 minute checks and observation by security cameras for safety. 

## 2016-12-28 NOTE — ED Notes (Signed)
Pt remains calm and cooperative. Compliant with evening medications. Pt requesting Trazodone at bedtime. Psychiatrist made aware by this Clinical research associatewriter. Maintained on 15 minute checks and observation by security camera for safety.

## 2016-12-28 NOTE — ED Notes (Signed)
Patient actively vomiting in bathroom. EDP made aware.

## 2016-12-28 NOTE — Progress Notes (Signed)
CSW consult acknowledged. CSW contacted attending EDP regarding consult. Per EDP, unsure of reason for consult at this time but notes that Patient disposition is pending psychiatry/behavioral medicine team recommendations. CSW has made note that Patient is from [facility] [place], [place] [phone])  and has a legal guardian- Victory DakinVivian Harris [phone]). CSW following  for discharge back to group home should Patient be psychiatrically cleared.    Enos FlingAshley Cylis Ayars, MSW, LCSW [facility] Clinical Social Worker [phone]

## 2016-12-28 NOTE — ED Notes (Signed)

## 2016-12-28 NOTE — ED Notes (Signed)
Pt cooperative with RN. Compliant with morning medications. Pt taking a shower. Maintained on 15 minute checks and observation by security camera for safety.

## 2016-12-28 NOTE — ED Notes (Signed)
CBG 108. 

## 2016-12-28 NOTE — ED Provider Notes (Signed)
-----------------------------------------   7:12 AM on 12/28/2016 -----------------------------------------   Blood pressure 118/72, pulse 87, temperature 98.5 F (36.9 C), temperature source Oral, resp. rate 16, height 5\' 11"  (1.803 m), weight 95.3 kg (210 lb), SpO2 98 %.  Zofran administered for patient vomiting overnight; given with good effect. No further vomiting. Sleeping at this time.  Disposition is pending Psychiatry/Behavioral Medicine team recommendations.     Irean HongSung, Krystina Strieter J, MD 12/28/16 [phone]

## 2016-12-28 NOTE — ED Notes (Signed)
Lance [name] with Dr. Fredna DowMasoud's office called. Did not disclose information about pt. She gave her office's phone number [phone] in case an appointment needed to be made by staff.

## 2016-12-28 NOTE — ED Notes (Signed)
Pt given lunch tray.

## 2016-12-28 NOTE — ED Notes (Signed)
Pt ate 100% of lunch tray

## 2016-12-28 NOTE — ED Notes (Signed)
Pt received dinner tray and soft drink.

## 2016-12-28 NOTE — ED Notes (Signed)
Pt ate 100% of breakfast tray. No episodes of emesis.

## 2016-12-28 NOTE — ED Notes (Signed)
Dr.Clapacs at bedside  

## 2016-12-29 ENCOUNTER — Inpatient Hospital Stay
Admission: EM | Admit: 2016-12-29 | Discharge: 2017-01-01 | DRG: 885 | Disposition: A | Discharge status: Home / Self Care | Payer: Medicare Other | Source: Intra-hospital | Attending: Psychiatry | Admitting: Psychiatry

## 2016-12-29 ENCOUNTER — Inpatient Hospital Stay (HOSPITAL_COMMUNITY): Admission: AD | Admit: 2016-12-29 | Payer: Medicare Other | Admitting: Psychiatry

## 2016-12-29 DIAGNOSIS — F17200 Nicotine dependence, unspecified, uncomplicated: Secondary | ICD-10-CM | POA: Diagnosis present

## 2016-12-29 DIAGNOSIS — F1290 Cannabis use, unspecified, uncomplicated: Secondary | ICD-10-CM | POA: Diagnosis present

## 2016-12-29 DIAGNOSIS — Z86711 Personal history of pulmonary embolism: Secondary | ICD-10-CM | POA: Diagnosis present

## 2016-12-29 DIAGNOSIS — I10 Essential (primary) hypertension: Secondary | ICD-10-CM | POA: Diagnosis present

## 2016-12-29 DIAGNOSIS — Z8249 Family history of ischemic heart disease and other diseases of the circulatory system: Secondary | ICD-10-CM | POA: Diagnosis not present

## 2016-12-29 DIAGNOSIS — F17210 Nicotine dependence, cigarettes, uncomplicated: Secondary | ICD-10-CM | POA: Diagnosis not present

## 2016-12-29 DIAGNOSIS — Z880 Allergy status to penicillin: Secondary | ICD-10-CM

## 2016-12-29 DIAGNOSIS — Z794 Long term (current) use of insulin: Secondary | ICD-10-CM

## 2016-12-29 DIAGNOSIS — Z7901 Long term (current) use of anticoagulants: Secondary | ICD-10-CM | POA: Diagnosis not present

## 2016-12-29 DIAGNOSIS — J449 Chronic obstructive pulmonary disease, unspecified: Secondary | ICD-10-CM | POA: Diagnosis present

## 2016-12-29 DIAGNOSIS — R45851 Suicidal ideations: Secondary | ICD-10-CM | POA: Diagnosis present

## 2016-12-29 DIAGNOSIS — F259 Schizoaffective disorder, unspecified: Secondary | ICD-10-CM | POA: Diagnosis present

## 2016-12-29 DIAGNOSIS — E785 Hyperlipidemia, unspecified: Secondary | ICD-10-CM | POA: Diagnosis present

## 2016-12-29 DIAGNOSIS — Z79899 Other long term (current) drug therapy: Secondary | ICD-10-CM | POA: Diagnosis not present

## 2016-12-29 DIAGNOSIS — F909 Attention-deficit hyperactivity disorder, unspecified type: Secondary | ICD-10-CM | POA: Diagnosis present

## 2016-12-29 DIAGNOSIS — K5900 Constipation, unspecified: Secondary | ICD-10-CM | POA: Diagnosis present

## 2016-12-29 DIAGNOSIS — D6862 Lupus anticoagulant syndrome: Secondary | ICD-10-CM | POA: Diagnosis present

## 2016-12-29 DIAGNOSIS — E119 Type 2 diabetes mellitus without complications: Secondary | ICD-10-CM | POA: Diagnosis not present

## 2016-12-29 DIAGNOSIS — F250 Schizoaffective disorder, bipolar type: Secondary | ICD-10-CM | POA: Diagnosis not present

## 2016-12-29 DIAGNOSIS — K117 Disturbances of salivary secretion: Secondary | ICD-10-CM | POA: Diagnosis present

## 2016-12-29 DIAGNOSIS — F319 Bipolar disorder, unspecified: Secondary | ICD-10-CM | POA: Diagnosis present

## 2016-12-29 DIAGNOSIS — G4700 Insomnia, unspecified: Secondary | ICD-10-CM | POA: Diagnosis present

## 2016-12-29 DIAGNOSIS — Z818 Family history of other mental and behavioral disorders: Secondary | ICD-10-CM | POA: Diagnosis not present

## 2016-12-29 DIAGNOSIS — F1421 Cocaine dependence, in remission: Secondary | ICD-10-CM | POA: Diagnosis present

## 2016-12-29 DIAGNOSIS — Z7984 Long term (current) use of oral hypoglycemic drugs: Secondary | ICD-10-CM | POA: Diagnosis not present

## 2016-12-29 LAB — PROTIME-INR
INR: 4.05 — AB
PROTHROMBIN TIME: 40.4 s — AB (ref 11.4–15.2)

## 2016-12-29 LAB — GLUCOSE, CAPILLARY: Glucose-Capillary: 87 mg/dL (ref 65–99)

## 2016-12-29 MED ORDER — MAGNESIUM HYDROXIDE 400 MG/5ML PO SUSP: 30.0000 mL | Freq: Every day | ORAL | Status: DC | PRN

## 2016-12-29 MED ORDER — ALUM & MAG HYDROXIDE-SIMETH 200-200-20 MG/5ML PO SUSP: 30.0000 mL | ORAL | Status: DC | PRN

## 2016-12-29 MED ORDER — LITHIUM CARBONATE ER 450 MG PO TBCR
450.0000 mg | EXTENDED_RELEASE_TABLET | Freq: Two times a day (BID) | ORAL | Status: DC
  Administered 2016-12-29 – 2017-01-01 (×6): 450 mg via ORAL
  Filled 2016-12-29 (×6): qty 1

## 2016-12-29 MED ORDER — METFORMIN HCL ER 500 MG PO TB24
500.0000 mg | ORAL_TABLET | Freq: Two times a day (BID) | ORAL | Status: DC
  Administered 2016-12-30 – 2017-01-01 (×5): 500 mg via ORAL
  Filled 2016-12-29 (×6): qty 1

## 2016-12-29 MED ORDER — ACETAMINOPHEN 325 MG PO TABS: 650.0000 mg | ORAL_TABLET | Freq: Four times a day (QID) | ORAL | Status: DC | PRN

## 2016-12-29 MED ORDER — CLOZAPINE 100 MG PO TABS
500.0000 mg | ORAL_TABLET | Freq: Every day | ORAL | Status: DC
  Administered 2016-12-29: 500 mg via ORAL
  Filled 2016-12-29: qty 5

## 2016-12-29 MED ORDER — INSULIN DETEMIR 100 UNIT/ML ~~LOC~~ SOLN
14.0000 [IU] | SUBCUTANEOUS | Status: DC
  Administered 2016-12-30 – 2017-01-01 (×3): 14 [IU] via SUBCUTANEOUS
  Filled 2016-12-29 (×6): qty 0.14

## 2016-12-29 MED ORDER — BUPROPION HCL ER (XL) 150 MG PO TB24
150.0000 mg | ORAL_TABLET | Freq: Every day | ORAL | Status: DC
  Administered 2016-12-29 – 2017-01-01 (×4): 150 mg via ORAL
  Filled 2016-12-29 (×4): qty 1

## 2016-12-29 MED ORDER — WARFARIN - PHARMACIST DOSING INPATIENT
Freq: Every day | Status: DC
  Filled 2016-12-29: qty 1

## 2016-12-29 NOTE — BH Assessment (Signed)
Clinician followed up on referral submissions:   [facility](Taneisha)- declined due to acuity  [facility](Mike)- wait list  [facility](Johnatha)- pending review  [facility](Christina)- medically declined  [facility](Chris)- pending review

## 2016-12-29 NOTE — Progress Notes (Signed)
Admitted to [facility] at 1800 from [facility] in scrubs with steady gait. Pleasant and cooperative during admission assessment. Verbalizes feelings. Skin and contraband search completed with this nurse and Dedra SkeensGwen, RN present. No contraband found. Calluses noted to bilateral feet. Feet appear dry and cracked, but not major wounds/ulcers present. Pt is requesting "someone to cut my toe nails. I was told by the doctor that I am not to cut my nails myself." Pt goes on to show that his nails were removed by a doctor, but are growing back in "chunks." Pt reporting reason for admission is "I just don't like my life right now." He goes on to explain "I don't like the group home where I live. I don't really have much family anymore." Pt does report he has recently been walking to relieve his stress, but doesn't do much else "because there's never anything to do." Currently denies SI, but reports he has been having suicidal thoughts with no plan. Verbally contracts for safety. Endorses having AVH "sometimes." Describes visual hallucinations involving "shapes and lines." Does not elaborate on auditory hallucinations. Pt denies any issues urinating, but explains that "last time my INR was high, it was like 10, and I was urinating blood." Pt encouraged to inform RN of any changes, if he notices bleeding. Verbalized understanding. Pt oriented to room/unit/call light, as he has had multiple admissions in the past. Pt packed a bag neatly of appropriate clothing and hygiene products for the unit prior to admission. Pt already ate dinner before transfer. Support and encouragement provided. Safety maintained with every 15 minute checks.

## 2016-12-29 NOTE — Progress Notes (Signed)
Patient ID: Lance [name], male   DOB: 1975-10-23, 41 y.o.   MRN: [id number] Per State regulations 482.30 this chart was reviewed for medical necessity with respect to the patient's admission/duration of stay.    Next review date: 01/01/17  Lance CoyerEric Natalio [name], BSN, RN-BC  Case Manager

## 2016-12-29 NOTE — ED Notes (Signed)
Patient is resting comfortably. 

## 2016-12-29 NOTE — ED Notes (Signed)
Patient up at nurses station, in no distress. Returned back to bed. Meds taken without any issues or concerns.

## 2016-12-29 NOTE — ED Notes (Signed)
Patient discharged to Inpatient beh health unit. Pt in no distress, vital signs stable. Belongings sent with patient

## 2016-12-29 NOTE — Tx Team (Signed)
Initial Treatment Plan 12/29/2016 6:36 PM Lance AlbrightJames P Pat ZOX:[id number]    PATIENT STRESSORS: Financial difficulties Legal issue   PATIENT STRENGTHS: Ability for insight Motivation for treatment/growth   PATIENT IDENTIFIED PROBLEMS:   Suicidal Ideations     "I just don't like the way my life is right now, I don't like living in a group home, never having anything to do."      [facility]         DISCHARGE CRITERIA:  Improved stabilization in mood, thinking, and/or behavior Medical problems require only outpatient monitoring Motivation to continue treatment in a less acute level of care Need for constant or close observation no longer present Verbal commitment to aftercare and medication compliance  PRELIMINARY DISCHARGE PLAN: Outpatient therapy Return to previous living arrangement  PATIENT/FAMILY INVOLVEMENT: This treatment plan has been presented to and reviewed with the patient, Lance [name].  The patient and family have been given the opportunity to ask questions and make suggestions.  Tonye PearsonAmanda N Freeland Pracht, RN 12/29/2016, 6:36 PM

## 2016-12-29 NOTE — Consult Note (Signed)
[facility] Consult   Reason for Consult:  Consult for 41 year old man with a history of schizophrenia who comes to the emergency room claiming to be having "bad thoughts" Referring Physician:  Eual Fines Patient Identification: TANVIR HIPPLE MRN:  [id number] Principal Diagnosis: Schizoaffective disorder, bipolar type [facility]) Diagnosis:   Patient Active Problem List   Diagnosis Date Noted  . Tobacco use disorder [F17.200] 09/01/2016  . Schizoaffective disorder, bipolar type ([place]) [F25.0] 05/22/2016  . Diabetes mellitus without complication ([place]) [I96.7] 05/21/2016  . Hyperlipidemia [E78.5] 05/21/2016  . History of pulmonary embolism [Z86.711] 05/21/2016  . Chronic anticoagulation [Z79.01] 05/21/2016  . Hypertension [I10] 09/25/2015    Total Time spent with patient: 20 minutes  Subjective:   KHYLIN GUTRIDGE is a 41 y.o. male patient admitted with "I've been suicidal".  Follow-up for this 41 year old man with schizophrenia or schizoaffective disorder. Patient seen today. He is still spending almost all of his time in bed. He actually sounded and looked a little worse emotionally today. He hardly opened his eyes and said that he felt very bad very depressed. He is eating and is compliant with his medicine. He insists that he does not feel physically sick but just feels very emotionally bad. No other change to medical condition at this point. There reportedly be a bed available this afternoon once discharges are completed downstairs and he has orders for admission. Encouragement and no change to medicine.  HPI:  Patient interviewed chart reviewed. Patient known to me from many prior encounters. 41 year old man with a history of chronic psychotic disorder had himself brought to the emergency room stating that he's been feeling more depressed and suicidal. He says that he is consumed by negative thoughts. Thinks about wishing he were dead and thinks about walking into traffic. Stresses  include acutely having a roommate that he dislikes at his group home but on a more long-term basis he is stressed out by his general existetial ennui about his life and how he feels empty. Patient says that he feels tired and run down. Doesn't feel like he has anything positive in his life. He says he still frequently has vague visual and auditory hallucinations despite compliance with medicine. Denies that he is abusing any drugs or alcohol.  Social history: Patient resides in a group home. I believe he has a guardian. He doesn't seem to have any family that are still involved with his life.  Medical history: Pretty significant medical problems for someone his age. He has diabetes that requires insulin. High blood pressure. He is also on Coumadin and is chronically anticoagulated because of his history of pulmonary embolisms which are related to an underlying clotting disorder. Payton Mccallum  Substance abuse history: Used to smoke weed fairly often but that seems to not be an issue anymore.  Past Psychiatric History: Patient has a long history of psychotic disorder diagnosed either as schizoaffective or schizophrenia. He is currently on clozapine and lithium which seems to of done a reasonable job at controlling his symptoms. In the past when not medicated he has had periods of getting extremely delusional and bizarre. Right now he seems to be less acutely psychotic and more depressed. He does have a history of suicidal threats and suicide attempts in the past. Has not been severely violent. He does have [employer] act team regularly  Risk to Self: Is patient at risk for suicide?: No Risk to Others:   Prior Inpatient Therapy:   Prior Outpatient Therapy:    Past  Medical History:  Past Medical History:  Diagnosis Date  . Depression   . Diabetes mellitus without complication ([place])   . Hyperlipidemia   . Hypertension   . Lupus anticoagulant disorder ([place]) 06/14/2012   as per Dr.pandit's note in dec  2013.  . PE (pulmonary thromboembolism) ([place])   . Schizo affective schizophrenia ([place])   . Supratherapeutic INR 11/19/2016   History reviewed. No pertinent surgical history. Family History:  Family History  Problem Relation Age of Onset  . CAD Mother   . CAD Sister    Family Psychiatric  History: Does not know of any Social History:  History  Alcohol Use No    Comment: occassionally     History  Drug Use  . Types: Marijuana    Comment: Last use 11/29/16    Social History   Social History  . Marital status: Single    Spouse name: N/A  . Number of children: N/A  . Years of education: N/A   Social History Main Topics  . Smoking status: Current Every Day Smoker    Packs/day: 1.00    Years: 23.00    Types: Cigarettes  . Smokeless tobacco: Never Used  . Alcohol use No     Comment: occassionally  . Drug use: Yes    Types: Marijuana     Comment: Last use 11/29/16  . Sexual activity: No     Comment: occasional marijuana- none recently   Other Topics Concern  . None   Social History Narrative   From a group home in [place]   Additional Social History:    Allergies:   Allergies  Allergen Reactions  . Penicillins Other (See Comments)    Reaction: "lockjaw" Has patient had a PCN reaction causing immediate rash, facial/tongue/throat swelling, SOB or lightheadedness with hypotension: No Has patient had a PCN reaction causing severe rash involving mucus membranes or skin necrosis: No Has patient had a PCN reaction that required hospitalization: No Has patient had a PCN reaction occurring within the last 10 years: No If all of the above answers are "NO", then may proceed with Cephalosporin use.     Labs:  Results for orders placed or performed during the hospital encounter of 12/27/16 (from the past 48 hour(s))  Comprehensive metabolic panel     Status: Abnormal   Collection Time: 12/27/16  7:58 PM  Result Value Ref Range   Sodium 140 135 - 145 mmol/L    Potassium 3.8 3.5 - 5.1 mmol/L   Chloride 105 101 - 111 mmol/L   CO2 25 22 - 32 mmol/L   Glucose, Bld 108 (H) 65 - 99 mg/dL   BUN 21 (H) 6 - 20 mg/dL   Creatinine, Ser 1.19 0.61 - 1.24 mg/dL   Calcium 9.8 8.9 - 10.3 mg/dL   Total Protein 7.2 6.5 - 8.1 g/dL   Albumin 4.7 3.5 - 5.0 g/dL   AST 24 15 - 41 U/L   ALT 28 17 - 63 U/L   Alkaline Phosphatase 55 38 - 126 U/L   Total Bilirubin 0.5 0.3 - 1.2 mg/dL   GFR calc non Af Amer >60 >60 mL/min   GFR calc Af Amer >60 >60 mL/min    Comment: (NOTE) The eGFR has been calculated using the CKD EPI equation. This calculation has not been validated in all clinical situations. eGFR's persistently <60 mL/min signify possible Chronic Kidney Disease.    Anion gap 10 5 - 15  Ethanol     Status: None  Collection Time: 12/27/16  7:58 PM  Result Value Ref Range   Alcohol, Ethyl (B) <5 <5 mg/dL    Comment:        LOWEST DETECTABLE LIMIT FOR SERUM ALCOHOL IS 5 mg/dL FOR MEDICAL PURPOSES ONLY   Urine Drug Screen, Qualitative     Status: None   Collection Time: 12/27/16  7:58 PM  Result Value Ref Range   Tricyclic, Ur Screen NONE DETECTED NONE DETECTED   Amphetamines, Ur Screen NONE DETECTED NONE DETECTED   MDMA (Ecstasy)Ur Screen NONE DETECTED NONE DETECTED   Cocaine Metabolite,Ur On[place]a NONE DETECTED NONE DETECTED   Opiate, Ur Screen NONE DETECTED NONE DETECTED   Phencyclidine (PCP) Ur S NONE DETECTED NONE DETECTED   Cannabinoid 50 Ng, Ur [place] NONE DETECTED NONE DETECTED   Barbiturates, Ur Screen NONE DETECTED NONE DETECTED   Benzodiazepine, Ur Scrn NONE DETECTED NONE DETECTED   Methadone Scn, Ur NONE DETECTED NONE DETECTED    Comment: (NOTE) 680  Tricyclics, urine               Cutoff 1000 ng/mL 200  Amphetamines, urine             Cutoff 1000 ng/mL 300  MDMA (Ecstasy), urine           Cutoff 500 ng/mL 400  Cocaine Metabolite, urine       Cutoff 300 ng/mL 500  Opiate, urine                   Cutoff 300 ng/mL 600  Phencyclidine (PCP), urine       Cutoff 25 ng/mL 700  Cannabinoid, urine              Cutoff 50 ng/mL 800  Barbiturates, urine             Cutoff 200 ng/mL 900  Benzodiazepine, urine           Cutoff 200 ng/mL 1000 Methadone, urine                Cutoff 300 ng/mL 1100 1200 The urine drug screen provides only a preliminary, unconfirmed 1300 analytical test result and should not be used for non-medical 1400 purposes. Clinical consideration and professional judgment should 1500 be applied to any positive drug screen result due to possible 1600 interfering substances. A more specific alternate chemical method 1700 must be used in order to obtain a confirmed analytical result.  1800 Gas chromato graphy / mass spectrometry (GC/MS) is the preferred 1900 confirmatory method.   CBC with Diff     Status: None   Collection Time: 12/27/16  7:58 PM  Result Value Ref Range   WBC 6.5 3.8 - 10.6 K/uL   RBC 5.02 4.40 - 5.90 MIL/uL   Hemoglobin 15.6 13.0 - 18.0 g/dL   HCT 44.5 40.0 - 52.0 %   MCV 88.6 80.0 - 100.0 fL   MCH 31.0 26.0 - 34.0 pg   MCHC 35.0 32.0 - 36.0 g/dL   RDW 13.3 11.5 - 14.5 %   Platelets 216 150 - 440 K/uL   Neutrophils Relative % 60 %   Neutro Abs 4.0 1.4 - 6.5 K/uL   Lymphocytes Relative 28 %   Lymphs Abs 1.8 1.0 - 3.6 K/uL   Monocytes Relative 11 %   Monocytes Absolute 0.7 0.2 - 1.0 K/uL   Eosinophils Relative 0 %   Eosinophils Absolute 0.0 0 - 0.7 K/uL   Basophils Relative 1 %   Basophils Absolute 0.0  0 - 0.1 K/uL  Salicylate level     Status: None   Collection Time: 12/27/16  7:58 PM  Result Value Ref Range   Salicylate Lvl <0.2 2.8 - 30.0 mg/dL  Acetaminophen level     Status: Abnormal   Collection Time: 12/27/16  7:58 PM  Result Value Ref Range   Acetaminophen (Tylenol), Serum <10 (L) 10 - 30 ug/mL    Comment:        THERAPEUTIC CONCENTRATIONS VARY SIGNIFICANTLY. A RANGE OF 10-30 ug/mL MAY BE AN EFFECTIVE CONCENTRATION FOR MANY PATIENTS. HOWEVER, SOME ARE BEST TREATED AT CONCENTRATIONS  OUTSIDE THIS RANGE. ACETAMINOPHEN CONCENTRATIONS >150 ug/mL AT 4 HOURS AFTER INGESTION AND >50 ug/mL AT 12 HOURS AFTER INGESTION ARE OFTEN ASSOCIATED WITH TOXIC REACTIONS.   Protime-INR     Status: Abnormal   Collection Time: 12/27/16  7:58 PM  Result Value Ref Range   Prothrombin Time 25.0 (H) 11.4 - 15.2 seconds   INR 2.22   Glucose, capillary     Status: Abnormal   Collection Time: 12/28/16  2:07 AM  Result Value Ref Range   Glucose-Capillary 108 (H) 65 - 99 mg/dL  Glucose, capillary     Status: None   Collection Time: 12/28/16  5:47 AM  Result Value Ref Range   Glucose-Capillary 96 65 - 99 mg/dL  Glucose, capillary     Status: None   Collection Time: 12/29/16  6:35 AM  Result Value Ref Range   Glucose-Capillary 87 65 - 99 mg/dL  Protime-INR     Status: Abnormal   Collection Time: 12/29/16  7:31 AM  Result Value Ref Range   Prothrombin Time 40.4 (H) 11.4 - 15.2 seconds   INR 4.05 (HH)     Comment: RESULT REPEATED AND VERIFIED CRITICAL RESULT CALLED TO, READ BACK BY AND VERIFIED WITH: JANETTE JONES 12/29/16 AT 0825 BY JAG     Current Facility-Administered Medications  Medication Dose Route Frequency Provider Last Rate Last Dose  . buPROPion (WELLBUTRIN XL) 24 hr tablet 150 mg  150 mg Oral Daily Merlyn Lot, MD   150 mg at 12/29/16 1025  . cloZAPine (CLOZARIL) tablet 500 mg  500 mg Oral QHS Merlyn Lot, MD   500 mg at 12/28/16 2141  . insulin detemir (LEVEMIR) injection 14 Units  14 Units Subcutaneous Particia Nearing, MD   14 Units at 12/29/16 [phone]  . lithium carbonate (ESKALITH) CR tablet 450 mg  450 mg Oral Q12H Merlyn Lot, MD   450 mg at 12/29/16 1025  . metFORMIN (GLUCOPHAGE-XR) 24 hr tablet 500 mg  500 mg Oral BID WC Merlyn Lot, MD   500 mg at 12/29/16 1025  . traZODone (DESYREL) tablet 100 mg  100 mg Oral QHS Geoffrey Mankin T, MD   100 mg at 12/28/16 2142  . [START ON 12/30/2016] Warfarin - Pharmacist Dosing Inpatient   Does not  apply q1800 Tahisha Hakim, Madie Reno, MD       Current Outpatient Prescriptions  Medication Sig Dispense Refill  . buPROPion (WELLBUTRIN XL) 150 MG 24 hr tablet Take 1 tablet (150 mg total) by mouth daily. 30 tablet 0  . cloZAPine (CLOZARIL) 100 MG tablet Take 5 tablets (500 mg total) by mouth at bedtime. 150 tablet 0  . docusate sodium (COLACE) 100 MG capsule Take 2 capsules (200 mg total) by mouth 2 (two) times daily. 120 capsule 0  . insulin detemir (LEVEMIR) 100 UNIT/ML injection Inject 14 Units into the skin every morning.    Marland Kitchen ipratropium (  ATROVENT) 0.06 % nasal spray Place 1 spray into both nostrils at bedtime. 3 mL 0  . lithium carbonate (ESKALITH) 450 MG CR tablet Take 1 tablet (450 mg total) by mouth every 12 (twelve) hours. 60 tablet 0  . metformin (FORTAMET) 500 MG (OSM) 24 hr tablet Take 500 mg by mouth 2 (two) times daily with a meal.    . mometasone-formoterol (DULERA) 200-5 MCG/ACT AERO Inhale 2 puffs into the lungs 2 (two) times daily. 1 Inhaler 1  . omega-3 acid ethyl esters (LOVAZA) 1 g capsule Take 1 g by mouth daily.    . polyethylene glycol (MIRALAX / GLYCOLAX) packet Take 17 g by mouth daily. 30 each 0  . simvastatin (ZOCOR) 40 MG tablet Take 40 mg by mouth at bedtime.    Marland Kitchen warfarin (COUMADIN) 10 MG tablet Take 1 tablet (10 mg total) by mouth daily at 6 PM. As prescribed by Dr Rebecka Apley on 6/1      Musculoskeletal: Strength & Muscle Tone: within normal limits Gait & Station: normal Patient leans: N/A  Psychiatric Specialty Exam: Physical Exam  Nursing note and vitals reviewed. Constitutional: He appears well-developed and well-nourished.  HENT:  Head: Normocephalic and atraumatic.  Eyes: Pupils are equal, round, and reactive to light. Conjunctivae are normal.  Neck: Normal range of motion.  Cardiovascular: Regular rhythm and normal heart sounds.   Respiratory: Effort normal. No respiratory distress.  GI: Soft.  Musculoskeletal: Normal range of motion.  Neurological: He  is alert.  Skin: Skin is warm and dry.  Psychiatric: His affect is blunt. His speech is delayed. He is slowed and withdrawn. Thought content is paranoid. Cognition and memory are impaired. He expresses impulsivity. He exhibits a depressed mood. He expresses suicidal ideation. He expresses no homicidal ideation. He exhibits abnormal recent memory.    Review of Systems  Constitutional: Negative.   HENT: Negative.   Eyes: Negative.   Respiratory: Negative.   Cardiovascular: Negative.   Gastrointestinal: Negative.   Musculoskeletal: Negative.   Skin: Negative.   Neurological: Negative.   Psychiatric/Behavioral: Positive for depression, hallucinations, memory loss and suicidal ideas. Negative for substance abuse. The patient is nervous/anxious and has insomnia.     Blood pressure 128/75, pulse 69, temperature 97.6 F (36.4 C), temperature source Oral, resp. rate 20, height '5\' 11"'  (1.803 m), weight 210 lb (95.3 kg), SpO2 99 %.Body mass index is 29.29 kg/m.  General Appearance: Disheveled  Eye Contact:  Minimal  Speech:  Slow  Volume:  Decreased  Mood:  Depressed and Dysphoric  Affect:  Congruent  Thought Process:  Goal Directed  Orientation:  Full (Time, Place, and Person)  Thought Content:  Rumination and Tangential  Suicidal Thoughts:  Yes.  with intent/plan  Homicidal Thoughts:  No  Memory:  Immediate;   Good Recent;   Fair Remote;   Fair  Judgement:  Impaired  Insight:  Shallow  Psychomotor Activity:  Decreased  Concentration:  Concentration: Fair  Recall:  [employer] of Knowledge:  Fair  Language:  Fair  Akathisia:  No  Handed:  Right  AIMS (if indicated):     Assets:  Communication Skills Desire for Improvement Financial Resources/Insurance Housing Resilience Social Support  ADL's:  Intact  Cognition:  Impaired,  Mild  Sleep:        Treatment Plan Summary: Daily contact with patient to assess and evaluate symptoms and progress in treatment, Medication  management and Plan No change to current treatment plan. Continue current medicine. Orders are in  place for admission downstairs.  Disposition: Recommend psychiatric Inpatient admission when medically cleared. Supportive therapy provided about ongoing stressors.  Alethia Berthold, MD 12/29/2016 12:49 PM

## 2016-12-29 NOTE — ED Notes (Signed)
IVC/Referrals faxed to multiple facilities awaiting response

## 2016-12-29 NOTE — ED Notes (Signed)
Patient in restroom. Patient offered and given fluids. In no distress. Calm and cooperative. Forwards little.

## 2016-12-29 NOTE — Progress Notes (Signed)
Patient's legal guardian has been [facility]. Legal guardian is Victory DakinVivian Harris ([phone])    Enos FlingAshley Milla Wahlberg, MSW, LCSW [facility] Clinical Social Worker [phone]

## 2016-12-29 NOTE — ED Notes (Signed)
Patient given meal tray and medications. In no distress.

## 2016-12-29 NOTE — ED Provider Notes (Signed)
Vitals:   12/28/16 1924 12/29/16 0635  BP: 123/65 128/75  Pulse: 82 69  Resp: 16 20  Temp: 98.1 F (36.7 C) 97.6 F (36.4 C)    Pt is on Coumadin for hx of PE and INR is >4 today.  I have written to d/c his Coumadin with recheck tomorrow.  No evidence of bleeding at this time.  Pt is noncompliant with meds and this is likely the cause of his elevated INR.  No acute medical complaints at this time.  Awaiting psych dispo.   Rockne MenghiniNorman, Anne-Caroline, MD 12/29/16 1101

## 2016-12-29 NOTE — Progress Notes (Signed)
ANTICOAGULATION CONSULT NOTE   Pharmacy Consult for warfarin dosing Indication: pulmonary embolus  Allergies  Allergen Reactions  . Penicillins Other (See Comments)    Reaction: "lockjaw" Has patient had a PCN reaction causing immediate rash, facial/tongue/throat swelling, SOB or lightheadedness with hypotension: No Has patient had a PCN reaction causing severe rash involving mucus membranes or skin necrosis: No Has patient had a PCN reaction that required hospitalization: No Has patient had a PCN reaction occurring within the last 10 years: No If all of the above answers are "NO", then may proceed with Cephalosporin use.     Patient Measurements: Height: 5\' 11"  (180.3 cm) Weight: 210 lb (95.3 kg) IBW/kg (Calculated) : 75.3 Heparin Dosing Weight: n/a  Vital Signs: Temp: 97.6 F (36.4 C) (07/17 0635) Temp Source: Oral (07/17 0635) BP: 128/75 (07/17 0635) Pulse Rate: 69 (07/17 0635)  Labs:  Recent Labs  12/27/16 1958 12/29/16 0731  HGB 15.6  --   HCT 44.5  --   PLT 216  --   LABPROT 25.0* 40.4*  INR 2.22 4.05*  CREATININE 1.19  --     Estimated Creatinine Clearance: 95.3 mL/min (by C-G formula based on SCr of 1.19 mg/dL).   Medical History: Past Medical History:  Diagnosis Date  . Depression   . Diabetes mellitus without complication ([facility])   . Hyperlipidemia   . Hypertension   . Lupus anticoagulant disorder ([facility]) 06/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  . PE (pulmonary thromboembolism) ([facility])   . Schizo affective schizophrenia ([facility])   . Supratherapeutic INR 11/19/2016    Medications:  Home dose of warfarin 10 mg.  Assessment: INR therapeutic on admission  7/15  INR 2.22   7/16  No INR Warfarin 10 mg 7/17  INR 4.05  Goal of Therapy:  INR 2-3    Plan:  INR supratherapeutic. Will HOLD Warfarin dose today. F/u INR in am.  Pallie Swigert A 12/29/2016,9:26 AM

## 2016-12-29 NOTE — ED Notes (Signed)
Date and time results received: 12/29/16 0826 (use smartphrase ".now" to insert current time)  Test: INR Critical Value:4.05  Name of Provider Notified: Dr Sharma CovertNorman  Orders Received? Or Actions Taken?: Orders Received - See Orders for details and Actions Taken: Hold Coumadin on 12/29/2016 and 12/30/2016

## 2016-12-29 NOTE — BH Assessment (Signed)
[facility] by Dr. Toni Amendlapacs.  Attending Physician will be Dr. Ardyth HarpsHernandez.   Patient [facility] Gwen.   ER staff is aware of the admission Misty Stanley( Lisa, ER Sect.; Dr. Scotty CourtStafford, ER MD; Marylu LundJanet, Patient's Nurse & Sharia ReeveJosh, Patient Access).

## 2016-12-29 NOTE — Progress Notes (Signed)
ANTICOAGULATION CONSULT NOTE - Initial Consult  Pharmacy Consult for warfarin dosing Indication: pulmonary embolus  Allergies  Allergen Reactions  . Penicillins Other (See Comments)    Reaction: "lockjaw" Has patient had a PCN reaction causing immediate rash, facial/tongue/throat swelling, SOB or lightheadedness with hypotension: No Has patient had a PCN reaction causing severe rash involving mucus membranes or skin necrosis: No Has patient had a PCN reaction that required hospitalization: No Has patient had a PCN reaction occurring within the last 10 years: No If all of the above answers are "NO", then may proceed with Cephalosporin use.     Patient Measurements: Height: 5\' 11"  (180.3 cm) Weight: 189 lb (85.7 kg) IBW/kg (Calculated) : 75.3 Heparin Dosing Weight: n/a  Vital Signs: Temp: 97.9 F (36.6 C) (07/17 1800) Temp Source: Oral (07/17 1800) BP: 127/80 (07/17 1800) Pulse Rate: 91 (07/17 1800)  Labs:  Recent Labs  12/27/16 1958 12/29/16 0731  HGB 15.6  --   HCT 44.5  --   PLT 216  --   LABPROT 25.0* 40.4*  INR 2.22 4.05*  CREATININE 1.19  --     Estimated Creatinine Clearance: 86.1 mL/min (by C-G formula based on SCr of 1.19 mg/dL).   Medical History: Past Medical History:  Diagnosis Date  . Depression   . Diabetes mellitus without complication ([facility])   . Hyperlipidemia   . Hypertension   . Lupus anticoagulant disorder ([facility]) 06/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  . PE (pulmonary thromboembolism) ([facility])   . Schizo affective schizophrenia ([facility])   . Supratherapeutic INR 11/19/2016    Medications:  Home dose of warfarin 10 mg.  Assessment: INR is supratherapeutic this AM.   Goal of Therapy:  INR 2-3    Plan:  Hold warfarin and recheck in the AM  Iyauna Sing D Capers Hagmann 12/29/2016,6:29 PM

## 2016-12-29 NOTE — BH Assessment (Signed)
Pt pending [facility] BMU bed assignment. 

## 2016-12-29 NOTE — Plan of Care (Signed)
Problem: Education: Goal: Knowledge of [place] [facility] Education information/materials will improve Outcome: Progressing Admitted to unit. Oriented to unit. Verbalizes understanding during admission process. Pt has been to this unit numerous times.   Problem: Physical Regulation: Goal: Ability to maintain clinical measurements within normal limits will improve Outcome: Not Progressing INR elevated. Coumadin to be held today and tomorrow, pharmacy to adjust dosing once therapeutic.   Problem: Safety: Goal: Periods of time without injury will increase Outcome: Progressing Pt free of injury on admission. Every 15 minute checks in place. Will continue to monitor.

## 2016-12-29 NOTE — Consult Note (Signed)
  Psychiatry: After putting in my note for today I checked his labs and see that his INR and prothrombin time are up in the 4 range which is certainly higher than it is supposed to be. I will check with the emergency room physician about any dosing changes or intervention that is needed.

## 2016-12-30 ENCOUNTER — Encounter: Payer: Self-pay | Admitting: Psychiatry

## 2016-12-30 DIAGNOSIS — F250 Schizoaffective disorder, bipolar type: Principal | ICD-10-CM

## 2016-12-30 LAB — PROTIME-INR
INR: 3.24
PROTHROMBIN TIME: 33.8 s — AB (ref 11.4–15.2)

## 2016-12-30 LAB — GLUCOSE, CAPILLARY: GLUCOSE-CAPILLARY: 86 mg/dL (ref 65–99)

## 2016-12-30 LAB — LIPID PANEL
Cholesterol: 160 mg/dL (ref 0–200)
HDL: 37 mg/dL — AB (ref >40)
LDL CALC: 59 mg/dL (ref 0–99)
Total CHOL/HDL Ratio: 4.3 RATIO
Triglycerides: 318 mg/dL — ABNORMAL HIGH (ref <150)
VLDL: 64 mg/dL — ABNORMAL HIGH (ref 0–40)

## 2016-12-30 LAB — TSH: TSH: 2.307 u[IU]/mL (ref 0.350–4.500)

## 2016-12-30 MED ORDER — ALBUTEROL SULFATE HFA 108 (90 BASE) MCG/ACT IN AERS: 1.0000 | INHALATION_SPRAY | RESPIRATORY_TRACT | Status: DC | PRN

## 2016-12-30 MED ORDER — GLUCERNA SHAKE PO LIQD
237.0000 mL | Freq: Two times a day (BID) | ORAL | Status: DC
  Administered 2016-12-31 – 2017-01-01 (×3): 237 mL via ORAL

## 2016-12-30 MED ORDER — MOMETASONE FURO-FORMOTEROL FUM 200-5 MCG/ACT IN AERO
2.0000 | INHALATION_SPRAY | Freq: Two times a day (BID) | RESPIRATORY_TRACT | Status: DC
  Administered 2016-12-30 – 2017-01-01 (×5): 2 via RESPIRATORY_TRACT
  Filled 2016-12-30: qty 8.8

## 2016-12-30 MED ORDER — POLYETHYLENE GLYCOL 3350 17 G PO PACK
17.0000 g | PACK | Freq: Every day | ORAL | Status: DC
  Administered 2016-12-30 – 2016-12-31 (×2): 17 g via ORAL
  Filled 2016-12-30 (×3): qty 1

## 2016-12-30 MED ORDER — IPRATROPIUM BROMIDE 0.06 % NA SOLN
1.0000 | Freq: Every day | NASAL | Status: DC
  Filled 2016-12-30: qty 15

## 2016-12-30 MED ORDER — WARFARIN SODIUM 2.5 MG PO TABS
2.5000 mg | ORAL_TABLET | Freq: Once | ORAL | Status: AC
  Administered 2016-12-30: 2.5 mg via ORAL
  Filled 2016-12-30: qty 1

## 2016-12-30 MED ORDER — WARFARIN - PHARMACIST DOSING INPATIENT
Freq: Every day | Status: DC
  Administered 2016-12-30: 2.5

## 2016-12-30 MED ORDER — CLOZAPINE 100 MG PO TABS
500.0000 mg | ORAL_TABLET | Freq: Every day | ORAL | Status: DC
  Administered 2016-12-30 – 2016-12-31 (×2): 500 mg via ORAL
  Filled 2016-12-30 (×2): qty 5

## 2016-12-30 MED ORDER — DOCUSATE SODIUM 100 MG PO CAPS
100.0000 mg | ORAL_CAPSULE | Freq: Two times a day (BID) | ORAL | Status: DC
  Administered 2016-12-30 – 2017-01-01 (×5): 100 mg via ORAL
  Filled 2016-12-30 (×5): qty 1

## 2016-12-30 MED ORDER — CLOZAPINE 100 MG PO TABS
500.0000 mg | ORAL_TABLET | Freq: Every day | ORAL | Status: DC
  Filled 2016-12-30: qty 5

## 2016-12-30 MED ORDER — ALBUTEROL SULFATE HFA 108 (90 BASE) MCG/ACT IN AERS
1.0000 | INHALATION_SPRAY | RESPIRATORY_TRACT | Status: DC | PRN
  Filled 2016-12-30: qty 6.7

## 2016-12-30 MED ORDER — NICOTINE 21 MG/24HR TD PT24
21.0000 mg | MEDICATED_PATCH | Freq: Every day | TRANSDERMAL | Status: DC
  Administered 2016-12-30 – 2016-12-31 (×2): 21 mg via TRANSDERMAL
  Filled 2016-12-30 (×3): qty 1

## 2016-12-30 NOTE — [facility] Suicide Risk Assessment (Signed)
[facility]   Nursing information obtained from:  Patient Demographic factors:  Male, Caucasian, Low socioeconomic status, Unemployed Current Mental Status:  Self-harm thoughts, Suicidal ideation indicated by patient Loss Factors:  Decrease in vocational status, Legal issues, Financial problems / change in socioeconomic status Historical Factors:  Family history of mental illness or substance abuse Risk Reduction Factors:  NA  Total Time spent with patient: 1 hour Principal Problem: <principal problem not specified> Diagnosis:   Patient Active Problem List   Diagnosis Date Noted  . Schizoaffective disorder, depressive type (HCC) [F25.1] 12/29/2016  . Tobacco use disorder [F17.200] 09/01/2016  . Schizoaffective disorder, bipolar type (HCC) [F25.0] 05/22/2016  . Diabetes mellitus without complication (HCC) [E11.9] 05/21/2016  . Hyperlipidemia [E78.5] 05/21/2016  . History of pulmonary embolism [Z86.711] 05/21/2016  . Chronic anticoagulation [Z79.01] 05/21/2016  . Hypertension [I10] 09/25/2015   Subjective Data:   Continued Clinical Symptoms:  Alcohol Use Disorder Identification Test Final Score (AUDIT): 0 The "Alcohol Use Disorders Identification Test", Guidelines for Use in Primary Care, Second Edition.  World Science writerHealth Organization [facility](WHO). Score between 0-7:  no or low risk or alcohol related problems. Score between 8-15:  moderate risk of alcohol related problems. Score between 16-19:  high risk of alcohol related problems. Score 20 or above:  warrants further diagnostic evaluation for alcohol dependence and treatment.   CLINICAL FACTORS:   Alcohol/Substance Abuse/Dependencies Schizophrenia:   Paranoid or undifferentiated type Currently Psychotic Previous Psychiatric Diagnoses and Treatments     Psychiatric Specialty Exam: Physical Exam   ROS   Blood pressure 103/72, pulse 69, temperature 97.7 F (36.5 C), resp. rate 16, height 5\' 11"  (1.803  m), weight 85.7 kg (189 lb), SpO2 99 %.Body mass index is 26.36 kg/m.                                                    Sleep:  Number of Hours: 7.15      COGNITIVE FEATURES THAT CONTRIBUTE TO RISK:  Closed-mindedness    SUICIDE RISK:   Moderate:  Frequent suicidal ideation with limited intensity, and duration, some specificity in terms of plans, no associated intent, good self-control, limited dysphoria/symptomatology, some risk factors present, and identifiable protective factors, including available and accessible social support.  [facility] CARE[facility]  I certify that inpatient services furnished can reasonably be expected to improve the patient's condition.   Jimmy FootmanHernandez-Gonzalez,  Maydelin Deming, MD 12/30/2016, 9:36 AM

## 2016-12-30 NOTE — Progress Notes (Signed)
Patient ID: Lance [name], male   DOB: [birth year], 41 y.o.   MRN: [id number] Neat appearance, appeared to have lost some weight since his last admission in June, 2018. Pleasant, polite, quiet, paces quietly, mood and affect sad, vague about SI, would not elaborate or comment about hallucinations; medication compliant. EKG obtained; PTT=40.4, INR=4.05. Remains on fall and injury rsik precautions, fall bundles placed by [facility] RN. Denied hematuria.

## 2016-12-30 NOTE — [facility] Suicide Risk Assessment (Signed)
[facility] INPATIENT:  Family/Significant Other Suicide Prevention Education  Suicide Prevention Education:  Education Completed;Vivian Tiburcio PeaHarris, legal guardian, [phone],  has been identified by the patient as the family member/significant other with whom the patient will be residing, and identified as the person(s) who will aid the patient in the event of a mental health crisis (suicidal ideations/suicide attempt).  With written consent from the patient, the family member/significant other has been provided the following suicide prevention education, prior to the and/or following the discharge of the patient.  The suicide prevention education provided includes the following:  Suicide risk factors  Suicide prevention and interventions  National Suicide Hotline telephone number  [facility] assessment telephone number  [facility] Emergency Assistance 911  [facility] and/or Residential Mobile Crisis Unit telephone number  Request made of family/significant other to:  Remove weapons (e.g., guns, rifles, knives), all items previously/currently identified as safety concern.    Remove drugs/medications (over-the-counter, prescriptions, illicit drugs), all items previously/currently identified as a safety concern.  The family member/significant other verbalizes understanding of the suicide prevention education information provided.  The family member/significant other agrees to remove the items of safety concern listed above.  Victory DakinVivian Harris has her own guardianship agency and was already aware that [name] had returned to the hospital.  She was also aware of the APS report on the hospital and said that [facility] had come to the group home on Saturday to find all the residents locked outside because the staff member had to leave.  Maureen RalphsVivian said that she is not currently working with pt on a new group home.  The reason for this is that they have found several group homes recently  and pt has decided at the last minute that he does not want to move after all.  THey also pursued the independent living option and pt also backed out of that at the last minute.  Pt has options like this and she will be happy to discuss them some more with pt.  Lorri FrederickWierda, Santosh Petter Jon, LCSW 12/30/2016, 2:55 PM
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
## 2016-12-30 NOTE — Progress Notes (Signed)
   12/30/16 1420  Clinical Encounter Type  Visited With Patient  Visit Type Spiritual support  Referral From Other (Comment) (Emotional support Group)   [facility] It was hot outside, he went back in.

## 2016-12-30 NOTE — Tx Team (Signed)
Interdisciplinary Treatment and Diagnostic Plan Update  12/30/2016 Time of Session: 1040 Lance [name] MRN: [id number]  Principal Diagnosis: Schizoaffective disorder, bipolar type [facility])  Secondary Diagnoses: Principal Problem:   Schizoaffective disorder, bipolar type ([place]) Active Problems:   Hypertension   Diabetes mellitus without complication ([place])   Hyperlipidemia   History of pulmonary embolism   Chronic anticoagulation   Tobacco use disorder   Current Medications:  Current Facility-Administered Medications  Medication Dose Route Frequency Provider Last Rate Last Dose  . acetaminophen (TYLENOL) tablet 650 mg  650 mg Oral Q6H PRN Clapacs, [name], MD      . albuterol (PROVENTIL HFA;VENTOLIN HFA) 108 (90 Base) MCG/ACT inhaler 1-2 puff  1-2 puff Inhalation Q4H PRN Hildred Priest, MD      . alum & mag hydroxide-simeth (MAALOX/MYLANTA) 200-200-20 MG/5ML suspension 30 mL  30 mL Oral Q4H PRN Clapacs, [name], MD      . buPROPion (WELLBUTRIN XL) 24 hr tablet 150 mg  150 mg Oral Daily Clapacs, Madie Reno, MD   150 mg at 12/30/16 0823  . cloZAPine (CLOZARIL) tablet 500 mg  500 mg Oral QHS Clapacs, Madie Reno, MD   500 mg at 12/29/16 2120  . docusate sodium (COLACE) capsule 100 mg  100 mg Oral BID Hildred Priest, MD   100 mg at 12/30/16 1213  . insulin detemir (LEVEMIR) injection 14 Units  14 Units Subcutaneous Honor Junes, Madie Reno, MD   14 Units at 12/30/16 0725  . ipratropium (ATROVENT) 0.06 % nasal spray 1 spray  1 spray Nasal QHS Hildred Priest, MD      . lithium carbonate (ESKALITH) CR tablet 450 mg  450 mg Oral Q12H Clapacs, Madie Reno, MD   450 mg at 12/30/16 0823  . magnesium hydroxide (MILK OF MAGNESIA) suspension 30 mL  30 mL Oral Daily PRN Clapacs, [name], MD      . metFORMIN (GLUCOPHAGE-XR) 24 hr tablet 500 mg  500 mg Oral BID WC Clapacs, Madie Reno, MD   500 mg at 12/30/16 0823  . mometasone-formoterol (DULERA) 200-5 MCG/ACT inhaler 2 puff  2 puff Inhalation  BID Hildred Priest, MD   2 puff at 12/30/16 1214  . nicotine (NICODERM CQ - dosed in mg/24 hours) patch 21 mg  21 mg Transdermal Daily Hildred Priest, MD   21 mg at 12/30/16 1214  . polyethylene glycol (MIRALAX / GLYCOLAX) packet 17 g  17 g Oral Daily Hildred Priest, MD   17 g at 12/30/16 1213   PTA Medications: Prescriptions Prior to Admission  Medication Sig Dispense Refill Last Dose  . buPROPion (WELLBUTRIN XL) 150 MG 24 hr tablet Take 1 tablet (150 mg total) by mouth daily. 30 tablet 0 Past Week at Unknown time  . cloZAPine (CLOZARIL) 100 MG tablet Take 5 tablets (500 mg total) by mouth at bedtime. 150 tablet 0 Past Week at Unknown time  . insulin detemir (LEVEMIR) 100 UNIT/ML injection Inject 14 Units into the skin every morning.   Past Month at Unknown time  . lisinopril (PRINIVIL,ZESTRIL) 2.5 MG tablet Take 2.5 mg by mouth daily.   Past Week at Unknown time  . lithium carbonate (ESKALITH) 450 MG CR tablet Take 1 tablet (450 mg total) by mouth every 12 (twelve) hours. 60 tablet 0 Past Week at Unknown time  . metformin (FORTAMET) 500 MG (OSM) 24 hr tablet Take 500 mg by mouth 2 (two) times daily with a meal.   Past Week at Unknown time  . omega-3  acid ethyl esters (LOVAZA) 1 g capsule Take 1 g by mouth daily.   Past Week at Unknown time  . simvastatin (ZOCOR) 40 MG tablet Take 40 mg by mouth at bedtime.   Past Week at Unknown time  . warfarin (COUMADIN) 10 MG tablet Take 1 tablet (10 mg total) by mouth daily at 6 PM. As prescribed by Dr Rebecka Apley on 6/1   Past Week at Unknown time  . docusate sodium (COLACE) 100 MG capsule Take 2 capsules (200 mg total) by mouth 2 (two) times daily. (Patient not taking: Reported on 12/30/2016) 120 capsule 0 Completed Course at Unknown time  . ipratropium (ATROVENT) 0.06 % nasal spray Place 1 spray into both nostrils at bedtime. (Patient not taking: Reported on 12/30/2016) 3 mL 0 Completed Course at Unknown time  .  mometasone-formoterol (DULERA) 200-5 MCG/ACT AERO Inhale 2 puffs into the lungs 2 (two) times daily. (Patient not taking: Reported on 12/29/2016) 1 Inhaler 1 Completed Course at Unknown time  . polyethylene glycol (MIRALAX / GLYCOLAX) packet Take 17 g by mouth daily. (Patient not taking: Reported on 12/30/2016) 30 each 0 Completed Course at Unknown time    Patient Stressors: Financial difficulties Legal issue  Patient Strengths: Ability for insight Motivation for treatment/growth  Treatment Modalities: Medication Management, Group therapy, Case management,  1 to 1 session with clinician, Psychoeducation, Recreational therapy.   Physician Treatment Plan for Primary Diagnosis: Schizoaffective disorder, bipolar type ([place]) Long Term Goal(s): Improvement in symptoms so as ready for discharge Improvement in symptoms so as ready for discharge   Short Term Goals: Ability to identify changes in lifestyle to reduce recurrence of condition will improve Ability to demonstrate self-control will improve Ability to verbalize feelings will improve  Medication Management: Evaluate patient's response, side effects, and tolerance of medication regimen.  Therapeutic Interventions: 1 to 1 sessions, Unit Group sessions and Medication administration.  Evaluation of Outcomes: Not Met  Physician Treatment Plan for Secondary Diagnosis: Principal Problem:   Schizoaffective disorder, bipolar type ([place]) Active Problems:   Hypertension   Diabetes mellitus without complication ([place])   Hyperlipidemia   History of pulmonary embolism   Chronic anticoagulation   Tobacco use disorder  Long Term Goal(s): Improvement in symptoms so as ready for discharge Improvement in symptoms so as ready for discharge   Short Term Goals: Ability to identify changes in lifestyle to reduce recurrence of condition will improve Ability to demonstrate self-control will improve Ability to verbalize feelings will improve      Medication Management: Evaluate patient's response, side effects, and tolerance of medication regimen.  Therapeutic Interventions: 1 to 1 sessions, Unit Group sessions and Medication administration.  Evaluation of Outcomes: Not Met   RN Treatment Plan for Primary Diagnosis: Schizoaffective disorder, bipolar type ([place]) Long Term Goal(s): Knowledge of disease and therapeutic regimen to maintain health will improve  Short Term Goals: Ability to verbalize feelings will improve, Ability to identify and develop effective coping behaviors will improve and Compliance with prescribed medications will improve  Medication Management: RN will administer medications as ordered by provider, will assess and evaluate patient's response and provide education to patient for prescribed medication. RN will report any adverse and/or side effects to prescribing provider.  Therapeutic Interventions: 1 on 1 counseling sessions, Psychoeducation, Medication administration, Evaluate responses to treatment, Monitor vital signs and CBGs as ordered, Perform/monitor CIWA, COWS, AIMS and Fall Risk screenings as ordered, Perform wound care treatments as ordered.  Evaluation of Outcomes: Not Met   LCSW Treatment Plan  for Primary Diagnosis: Schizoaffective disorder, bipolar type ([place]) Long Term Goal(s): Safe transition to appropriate next level of care at discharge, Engage patient in therapeutic group addressing interpersonal concerns.  Short Term Goals: Engage patient in aftercare planning with referrals and resources and Increase skills for wellness and recovery  Therapeutic Interventions: Assess for all discharge needs, 1 to 1 time with Social worker, Explore available resources and support systems, Assess for adequacy in community support network, Educate family and significant other(s) on suicide prevention, Complete Psychosocial Assessment, Interpersonal group therapy.  Evaluation of Outcomes: Not Met   Progress  in Treatment: Attending groups: No. Participating in groups: No. Taking medication as prescribed: Yes. Toleration medication: Yes. Family/Significant other contact made: No, will contact:  when given permission Patient understands diagnosis: Yes. Discussing patient identified problems/goals with staff: Yes. Medical problems stabilized or resolved: Yes. Denies suicidal/homicidal ideation: Yes. Issues/concerns per patient self-inventory: No. Other: none  New problem(s) identified: No, Describe:  none  New Short Term/Long Term Goal(s): Pt goal: "I don't feel good.  Need help with my sleep."  Discharge Plan or Barriers: Pt will return to ACT team services with [facility].  Reason for Continuation of Hospitalization: Medication stabilization  Estimated Length of Stay:5-7 days.  Attendees: Patient: Lance [name] 12/30/2016   Physician: Dr. Jerilee Hoh, MD 12/30/2016   Nursing: Polly Cobia, RN 12/30/2016   RN Care Manager: 12/30/2016   Social Worker: Lurline Idol, LCSW, Dossie Arbour, LCSW 12/30/2016   Recreational Therapist:  12/30/2016   Other:  12/30/2016  Other:  12/30/2016   Other: 12/30/2016     Scribe for Treatment Team: Joanne Chars, [place] 12/30/2016 12:40 PM

## 2016-12-30 NOTE — Progress Notes (Signed)
Legal guardian, Victory DakinVivian Harris,  [phone], has been contacted.  Please see SPE note for detail. Garner NashGregory Cheyna Retana, MSW, LCSW Clinical Social Worker 12/30/2016 3:02 PM

## 2016-12-30 NOTE — Progress Notes (Signed)
ANTICOAGULATION CONSULT NOTE - Initial Consult  Pharmacy Consult for warfarin dosing Indication: pulmonary embolus  Allergies  Allergen Reactions  . Penicillins Other (See Comments)    Reaction: "lockjaw" Has patient had a PCN reaction causing immediate rash, facial/tongue/throat swelling, SOB or lightheadedness with hypotension: No Has patient had a PCN reaction causing severe rash involving mucus membranes or skin necrosis: No Has patient had a PCN reaction that required hospitalization: No Has patient had a PCN reaction occurring within the last 10 years: No If all of the above answers are "NO", then may proceed with Cephalosporin use.     Patient Measurements: Height: 5\' 11"  (180.3 cm) Weight: 189 lb (85.7 kg) IBW/kg (Calculated) : 75.3 Heparin Dosing Weight: n/a  Vital Signs: Temp: 97.7 F (36.5 C) (07/18 0645) BP: 103/72 (07/18 0645) Pulse Rate: 69 (07/18 0645)  Labs:  Recent Labs  12/27/16 1958 12/29/16 0731 12/30/16 0956  HGB 15.6  --   --   HCT 44.5  --   --   PLT 216  --   --   LABPROT 25.0* 40.4* 33.8*  INR 2.22 4.05* 3.24  CREATININE 1.19  --   --     Estimated Creatinine Clearance: 86.1 mL/min (by C-G formula based on SCr of 1.19 mg/dL).   Medical History: Past Medical History:  Diagnosis Date  . Depression   . Diabetes mellitus without complication ([facility])   . Hyperlipidemia   . Hypertension   . Lupus anticoagulant disorder ([facility]) 06/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  . PE (pulmonary thromboembolism) ([facility])   . Schizo affective schizophrenia ([facility])   . Supratherapeutic INR 11/19/2016    Medications:  Home dose of warfarin 10 mg.  Assessment: INR is supratherapeutic this AM.   Date  INR  Coumadin Dose 7/16    10 mg  7/17  4.05   7/18  3.24      Goal of Therapy:  INR 2-3    Plan:  Will give reduced dose of 2.5 mg today and f/u AM INR.   Luisa Harthristy, Etosha Wetherell D 12/30/2016,1:31 PM

## 2016-12-30 NOTE — Progress Notes (Signed)
CSW met with  [facility] APS worker Durenda Age.  [phone].  She was initiating an APS report made against the group home regarding the residents being asked to leave the home due to a staffing shortage on Saturday, 7/14.  She met with pt on this date. Winferd Humphrey, MSW, LCSW Clinical Social Worker 12/30/2016 2:44 PM

## 2016-12-30 NOTE — Plan of Care (Signed)
Problem: Activity: Goal: Sleeping patterns will improve Outcome: Progressing Patient slept for Estimated Hours of 7.15; Precautionary checks every 15 minutes for safety maintained, room free of safety hazards, patient sustains no injury or falls during this shift.    

## 2016-12-30 NOTE — Progress Notes (Signed)
NUTRITION ASSESSMENT  Pt identified as at risk on the Malnutrition Screen Tool  INTERVENTION: 1. Educated patient on the importance of nutrition and encouraged intake of food and beverages. 2. Discussed weight goals. 3. Supplements: Glucerna Shake po BID, each supplement provides 220 kcal and 10 grams of protein  NUTRITION DIAGNOSIS: Unintentional weight loss related to sub-optimal intake as evidenced by pt report.   Goal: Pt to meet >/= 90% of their estimated nutrition needs.  Monitor:  PO intake  Assessment:  41 y.o. male  With PMHx of DM type 2, HTN, HLD, depression, schizo affective schizophrenia, pulmonary thromboembolism.  Patient reports he has not been eating well for the past 6 months to a year. He is not sure what is causing his poor appetite. He reports that he occasionally misses meals at his group home. Sometimes it is because he does not feel hungry enough to eat, but reports sometimes it is because the staff at the group home do not prepare a meal. He reports they occasionally oversleep and will miss preparing breakfast or lunch. He reports he used to weigh 210 lbs. He reports he has lost 21 lbs (10% body weight) over the past 6 months, which is significant for time frame.   Meal Completion: 90% of breakfast and lunch today per chart Patient reports he is eating well here but does not feel like it is enough food for him. He would like to drink Glucerna.  Height: Ht Readings from Last 1 Encounters:  12/29/16 5\' 11"  (1.803 m)    Weight: Wt Readings from Last 1 Encounters:  12/29/16 189 lb (85.7 kg)    Weight Hx: Wt Readings from Last 10 Encounters:  12/29/16 189 lb (85.7 kg)  12/27/16 210 lb (95.3 kg)  12/01/16 188 lb (85.3 kg)  11/29/16 196 lb (88.9 kg)  11/19/16 196 lb 8 oz (89.1 kg)  08/31/16 210 lb (95.3 kg)  08/31/16 210 lb (95.3 kg)  05/21/16 200 lb (90.7 kg)  05/21/16 210 lb (95.3 kg)  04/09/16 210 lb (95.3 kg)    BMI:  Body mass index is 26.36  kg/m. Pt meets criteria for overweight based on current BMI.  Estimated Nutritional Needs: Kcal: 25-30 kcal/kg Protein: > 1 gram protein/kg Fluid: 1 ml/kcal  Diet Order: Diet Carb Modified Fluid consistency: Thin; Room service appropriate? Yes Pt is also offered choice of unit snacks mid-morning and mid-afternoon.  Pt is eating as desired.   Lab results and medications reviewed.   Helane RimaLeanne Aima Mcwhirt, MS, RD, LDN Pager: [phone] After Hours Pager: [phone]

## 2016-12-30 NOTE — Plan of Care (Signed)
Problem: Education: Goal: Utilization of techniques to improve thought processes will improve Outcome: Progressing Attending unit programing  Goal: Knowledge of the prescribed therapeutic regimen will improve Outcome: Progressing Staff instruction  And redirection with  Information   Problem: Coping: Goal: Ability to cope will improve Outcome: Progressing Information on coping skills given   Problem: Education: Goal: Knowledge of  General Education information/materials will improve Outcome: Progressing Patient does have difficulty with processing information  Goal: Emotional status will improve Outcome: Progressing Flat affect , encourage to explore activity on unit Goal: Mental status will improve Outcome: Progressing Encourage to stay out of room  Positive strokes given  For efforts made  Goal: Verbalization of understanding the information provided will improve Outcome: Progressing Staff assist with understanding information given

## 2016-12-30 NOTE — [facility] Group Notes (Signed)
[facility] LCSW Group Therapy   12/30/2016  1:00 pm   Type of Therapy: Group Therapy   Participation Level: Active   Participation Quality: Attentive, Sharing and Supportive   Affect: Flat  Cognitive: Alert and Oriented   Insight: Developing/Improving and Engaged   Engagement in Therapy: Developing/Improving and Engaged   Modes of Intervention: Clarification, Confrontation, Discussion, Education, Exploration, Limit-setting, Orientation, Problem-solving, Rapport Building, Dance movement psychotherapisteality Testing, Socialization and Support   Summary of Progress/Problems: The topic for group today was emotional regulation. This group focused on both positive and negative emotion identification and allowed  group members to process ways to identify feelings, regulate negative emotions, and find healthy ways to manage internal/external emotions. Group members were asked to reflect on a time when their reaction to an emotion led to a negative outcome and explored how alternative responses using emotion regulation would have benefited them. Group members were also asked to discuss a time when emotion regulation was utilized when a negative emotion was experienced. Pt stated emotions he was feeling before hospitalization. Pt shared how to regulate negative emotions in order to stay stable and healthy. CSW provided support to patient.     Hampton AbbotKadijah Corrinne Benegas, MSW, LCSW-A 12/30/2016, 1:32PM

## 2016-12-30 NOTE — Progress Notes (Signed)
D: Patient continues to walk  The halls all shift long  No interaction with peers . Patient noted more conversational this admission than previous one . Voice of needing his feet toe nails   seen by a podiatrist , voice  of the nail   Growing out differently . Patient able to tell his doctor   she would make an appointment for him .  No group  This shift .  Appetite good and voice no concerns around sleep. last night .Stated Denies suicidal  homicidal ideations  .  Marland Kitchen.  A: Encourage patient participation with unit programming . Instruction  Given on  Medication , verbalize understanding. R: Voice no other concerns. Staff continue to monitor

## 2016-12-31 LAB — HEMOGLOBIN A1C
Hgb A1c MFr Bld: 5.5 % (ref 4.8–5.6)
Mean Plasma Glucose: 111 mg/dL

## 2016-12-31 LAB — GLUCOSE, CAPILLARY: Glucose-Capillary: 99 mg/dL (ref 65–99)

## 2016-12-31 LAB — PROTIME-INR
INR: 2.1
Prothrombin Time: 23.9 seconds — ABNORMAL HIGH (ref 11.4–15.2)

## 2016-12-31 MED ORDER — WARFARIN SODIUM 2.5 MG PO TABS
2.5000 mg | ORAL_TABLET | Freq: Once | ORAL | Status: AC
  Administered 2016-12-31: 2.5 mg via ORAL
  Filled 2016-12-31: qty 1

## 2016-12-31 MED ORDER — ALBUTEROL SULFATE HFA 108 (90 BASE) MCG/ACT IN AERS: 1.0000 | INHALATION_SPRAY | RESPIRATORY_TRACT | 0 refills | Status: DC | PRN

## 2016-12-31 NOTE — Progress Notes (Signed)
Patient ID: Lance [name], male   DOB: August 21, 1975, 41 y.o.   MRN: [id number] Quietly pacing as if in deep thoughts, pleasant on approach, "I had a good day, I slept well last night, I will take the Clozaril after snacks ..." Patient attended the wrap-up group, received snacks and medications and went to bed; denied pain, denied SI/HI/AVH, remains on Falls/injury precautions; PTT/INR: 33.8/3.24.

## 2016-12-31 NOTE — Progress Notes (Signed)
Pt is requesting to take his Clozaril as ordered "tonight after snack." Dose time moved due to patient willing to be compliant with taking the medication after snack (9pm). Pt reports medication makes him very sleepy as soon as he takes it and he does not wish to be sleepy until bedtime. Pt is compliant with taking all other medications at scheduled time.

## 2016-12-31 NOTE — Plan of Care (Signed)
Problem: Activity: Goal: Sleeping patterns will improve Outcome: Progressing Patient slept for Estimated Hours of 7.30; Precautionary checks every 15 minutes for safety maintained, room free of safety hazards, patient sustains no injury or falls during this shift.    

## 2016-12-31 NOTE — Progress Notes (Signed)
Pt up on unit today, pacing the hallway. Denies SI/HI/AVH, pain. Appears to be brighter, less depressed. Minimal interaction with peers. Attends group. Medication compliant. Support and encouragement provided. Medications administered as ordered with education. Safety maintained with every 15 minute checks. Will continue to monitor.

## 2016-12-31 NOTE — Plan of Care (Signed)
Problem: Activity: Goal: Interest or engagement in activities will improve Outcome: Progressing Pt has been pacing hallways today. Minimal interactions with staff/peers. Attends group.

## 2016-12-31 NOTE — Progress Notes (Signed)
ANTICOAGULATION CONSULT NOTE - Follow up Consult  Pharmacy Consult for warfarin dosing Indication: pulmonary embolus  Allergies  Allergen Reactions  . Penicillins Other (See Comments)    Reaction: "lockjaw" Has patient had a PCN reaction causing immediate rash, facial/tongue/throat swelling, SOB or lightheadedness with hypotension: No Has patient had a PCN reaction causing severe rash involving mucus membranes or skin necrosis: No Has patient had a PCN reaction that required hospitalization: No Has patient had a PCN reaction occurring within the last 10 years: No If all of the above answers are "NO", then may proceed with Cephalosporin use.     Patient Measurements: Height: 5\' 11"  (180.3 cm) Weight: 189 lb (85.7 kg) IBW/kg (Calculated) : 75.3 Heparin Dosing Weight: n/a  Vital Signs: Temp: 97.7 F (36.5 C) (07/19 0656) Temp Source: Oral (07/19 0656) BP: 123/55 (07/19 0656) Pulse Rate: 76 (07/19 0656)  Labs:  Recent Labs  12/29/16 0731 12/30/16 0956 12/31/16 0701  LABPROT 40.4* 33.8* 23.9*  INR 4.05* 3.24 2.10    Estimated Creatinine Clearance: 86.1 mL/min (by C-G formula based on SCr of 1.19 mg/dL).   Medical History: Past Medical History:  Diagnosis Date  . Depression   . Diabetes mellitus without complication ([facility])   . Hyperlipidemia   . Hypertension   . Lupus anticoagulant disorder ([facility]) 06/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  . PE (pulmonary thromboembolism) ([facility])   . Schizo affective schizophrenia ([facility])   . Supratherapeutic INR 11/19/2016    Medications:  Home dose of warfarin 10 mg.  Assessment: INR is supratherapeutic this AM.   Date  INR  Coumadin Dose 7/16    10 mg  7/17  4.05   7/18  3.24                 2.5 mg 7/19                 2.10       Goal of Therapy:  INR 2-3    Plan:  Will give reduced dose of 2.5 mg today and f/u AM INR.   Trentyn Boisclair K, RPH 12/31/2016,1:47 PM

## 2016-12-31 NOTE — [facility] Group Notes (Signed)
[facility] LCSW Group Therapy Note  Date/Time: 12/31/16, 1300  Type of Therapy/Topic:  Group Therapy:  Balance in Life  Participation Level:  active  Description of Group:    This group will address the concept of balance and how it feels and looks when one is unbalanced. Patients will be encouraged to process areas in their lives that are out of balance, and identify reasons for remaining unbalanced. Facilitators will guide patients utilizing problem- solving interventions to address and correct the stressor making their life unbalanced. Understanding and applying boundaries will be explored and addressed for obtaining  and maintaining a balanced life. Patients will be encouraged to explore ways to assertively make their unbalanced needs known to significant others in their lives, using other group members and facilitator for support and feedback.  Therapeutic Goals: 1. Patient will identify two or more emotions or situations they have that consume much of in their lives. 2. Patient will identify signs/triggers that life has become out of balance:  3. Patient will identify two ways to set boundaries in order to achieve balance in their lives:  4. Patient will demonstrate ability to communicate their needs through discussion and/or role plays  Summary of Patient Progress: Pt was very active in group today making multiple contributions to the group discussion.  Pt have several examples from his own situation and was appropriate and engaged throughout.          Therapeutic Modalities:   Cognitive Behavioral Therapy Solution-Focused Therapy Assertiveness Training  Daleen SquibbGreg Folashade [place], [place]

## 2016-12-31 NOTE — Progress Notes (Signed)
[facility]  12/31/2016 12:50 PM Lance [name]  MRN:  [id number] Subjective:  41 y/o wm With schizoaffective disorder bipolar type. Patient brought himself to the emergency department on July 15 voicing worsening mood and suicidal ideation.  Patient has been hospitalized in our unit a multitude of times. He was just discharged on June 21.  [phone]), [place]. He is under the guardianship of [name] and receives [facility] through [name]  Pt reported in the ER  that he's been feeling more depressed and suicidal. He says that he is consumed by negative thoughts. Thinks about wishing he were dead and thinks about walking into traffic. Stresses include acutely having a roommate that he dislikes at his group home but on a more long-term basis he is stressed out by his life  and how he feels empty. Patient says that he feels tired and run down. Doesn't feel like he has anything positive in his life. He says he still frequently has vague visual and auditory hallucinations despite compliance with medicine. Denies that he is abusing any drugs or alcohol.  7/19 patient reports feeling a little better today "I will be okay". He has been eating and sleeping well. He denies suicidality, homicidality or hallucinations. Medications make him feel tired but other than that he denies any side effects   Per nursing: Quietly pacing as if in deep thoughts, pleasant on approach, "I had a good day, I slept well last night, I will take the Clozaril after snacks ..." Patient attended the wrap-up group, received snacks and medications and went to bed; denied pain, denied SI/HI/AVH, remains on Falls/injury precautions; PTT/INR: 33.8/3.24.  Principal Problem: Schizoaffective disorder, bipolar type (HCC) Diagnosis:   Patient Active Problem List   Diagnosis Date Noted  . Tobacco use disorder [F17.200] 09/01/2016  .  Schizoaffective disorder, bipolar type (HCC) [F25.0] 05/22/2016  . Diabetes mellitus without complication (HCC) [E11.9] 05/21/2016  . Hyperlipidemia [E78.5] 05/21/2016  . History of pulmonary embolism [Z86.711] 05/21/2016  . Chronic anticoagulation [Z79.01] 05/21/2016  . Hypertension [I10] 09/25/2015   Total Time spent with patient: 30 minutes  Past Psychiatric History: Patient states he was diagnosed with schizophrenia as a teenager. [facility]  At age 41 he received a diagnosis of ADHD  Per records he has a history of self injury and he tried to overdose at the age of 41.   Past Medical History:  Past Medical History:  Diagnosis Date  . Depression   . Diabetes mellitus without complication (HCC)   . Hyperlipidemia   . Hypertension   . Lupus anticoagulant disorder (HCC) 06/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  . PE (pulmonary thromboembolism) (HCC)   . Schizo affective schizophrenia (HCC)   . Supratherapeutic INR 11/19/2016   History reviewed. No pertinent surgical history. Family History:  Family History  Problem Relation Age of Onset  . CAD Mother   . CAD Sister    Family Psychiatric  History: Patient reports that his mother had multiple personality disorder and is an alcoholic. His father was diagnosed with depression and alcoholism. He also has a sister who is alcoholic  Social History: Patient is single, never married, he doesn't have any children. [place] and has been there for the last 2 years. Highest level of education is eighth grade.  No current legal charges but in the past  he has received charges for assault--- has been in jail in the past. History  Alcohol Use No    Comment: occassionally     History  Drug Use  . Types: Marijuana    Comment: Last use 11/29/16    Social History   Social History  . Marital status: Single    Spouse name: N/A  . Number of children: N/A  . Years of education:  N/A   Social History Main Topics  . Smoking status: Current Every Day Smoker    Packs/day: 1.00    Years: 23.00    Types: Cigarettes  . Smokeless tobacco: Never Used  . Alcohol use No     Comment: occassionally  . Drug use: Yes    Types: Marijuana     Comment: Last use 11/29/16  . Sexual activity: No     Comment: occasional marijuana- none recently   Other Topics Concern  . None   Social History Narrative   From a group home in [place]   A Current Medications: Current Facility-Administered Medications  Medication Dose Route Frequency Provider Last Rate Last Dose  . acetaminophen (TYLENOL) tablet 650 mg  650 mg Oral Q6H PRN Clapacs, [name], MD      . albuterol (PROVENTIL HFA;VENTOLIN HFA) 108 (90 Base) MCG/ACT inhaler 1-2 puff  1-2 puff Inhalation Q4H PRN Jimmy Footman, MD      . alum & mag hydroxide-simeth (MAALOX/MYLANTA) 200-200-20 MG/5ML suspension 30 mL  30 mL Oral Q4H PRN Clapacs, [name], MD      . buPROPion (WELLBUTRIN XL) 24 hr tablet 150 mg  150 mg Oral Daily Clapacs, [name], MD   150 mg at 12/31/16 0817  . cloZAPine (CLOZARIL) tablet 500 mg  500 mg Oral QHS Jimmy Footman, MD   500 mg at 12/30/16 2115  . docusate sodium (COLACE) capsule 100 mg  100 mg Oral BID Jimmy Footman, MD   100 mg at 12/31/16 0817  . feeding supplement (GLUCERNA SHAKE) (GLUCERNA SHAKE) liquid 237 mL  237 mL Oral BID BM Jimmy Footman, MD   237 mL at 12/31/16 1000  . insulin detemir (LEVEMIR) injection 14 Units  14 Units Subcutaneous Bryn Gulling, [name], MD   14 Units at 12/31/16 0701  . ipratropium (ATROVENT) 0.06 % nasal spray 1 spray  1 spray Nasal QHS Jimmy Footman, MD      . lithium carbonate (ESKALITH) CR tablet 450 mg  450 mg Oral Q12H Clapacs, [name], MD   450 mg at 12/31/16 0817  . magnesium hydroxide (MILK OF MAGNESIA) suspension 30 mL  30 mL Oral Daily PRN Clapacs, [name], MD      . metFORMIN (GLUCOPHAGE-XR) 24 hr tablet  500 mg  500 mg Oral BID WC Clapacs, [name], MD   500 mg at 12/31/16 0820  . mometasone-formoterol (DULERA) 200-5 MCG/ACT inhaler 2 puff  2 puff Inhalation BID Jimmy Footman, MD   2 puff at 12/31/16 0817  . nicotine (NICODERM CQ - dosed in mg/24 hours) patch 21 mg  21 mg Transdermal Daily Jimmy Footman, MD   21 mg at 12/31/16 0819  . polyethylene glycol (MIRALAX / GLYCOLAX) packet 17 g  17 g Oral Daily Jimmy Footman, MD   17 g at 12/31/16 0817  . Warfarin - Pharmacist Dosing Inpatient   Does not apply q1800 Valentina Gu, RPH   2.5 each at 12/30/16 1759    Lab Results:  Results for orders placed or performed during the  hospital encounter of 12/29/16 (from the past 48 hour(s))  Glucose, capillary     Status: None   Collection Time: 12/30/16  7:09 AM  Result Value Ref Range   Glucose-Capillary 86 65 - 99 mg/dL   Comment 1 Notify RN   Hemoglobin A1c     Status: None   Collection Time: 12/30/16  9:56 AM  Result Value Ref Range   Hgb A1c MFr Bld 5.5 4.8 - 5.6 %    Comment: (NOTE)         Pre-diabetes: 5.7 - 6.4         Diabetes: >6.4         Glycemic control for adults with diabetes: <7.0    Mean Plasma Glucose 111 mg/dL    Comment: (NOTE) Performed At: [facility] [address] [place], [place] [id number] Mila Homer MD WU:[id number]   Lipid panel     Status: Abnormal   Collection Time: 12/30/16  9:56 AM  Result Value Ref Range   Cholesterol 160 0 - 200 mg/dL   Triglycerides 956 (H) <150 mg/dL   HDL 37 (L) >21 mg/dL   Total CHOL/HDL Ratio 4.3 RATIO   VLDL 64 (H) 0 - 40 mg/dL   LDL Cholesterol 59 0 - 99 mg/dL    Comment:        Total Cholesterol/HDL:CHD Risk Coronary Heart Disease Risk Table                     Men   Women  1/2 Average Risk   3.4   3.3  Average Risk       5.0   4.4  2 X Average Risk   9.6   7.1  3 X Average Risk  23.4   11.0        Use the calculated Patient Ratio above and the CHD Risk Table to  determine the patient's CHD Risk.        ATP III CLASSIFICATION (LDL):  <100     mg/dL   Optimal  308-657  mg/dL   Near or Above                    Optimal  130-159  mg/dL   Borderline  846-962  mg/dL   High  >952     mg/dL   Very High   TSH     Status: None   Collection Time: 12/30/16  9:56 AM  Result Value Ref Range   TSH 2.307 0.350 - 4.500 uIU/mL    Comment: Performed by a 3rd Generation assay with a functional sensitivity of <=0.01 uIU/mL.  Protime-INR     Status: Abnormal   Collection Time: 12/30/16  9:56 AM  Result Value Ref Range   Prothrombin Time 33.8 (H) 11.4 - 15.2 seconds   INR 3.24   Protime-INR     Status: Abnormal   Collection Time: 12/31/16  7:01 AM  Result Value Ref Range   Prothrombin Time 23.9 (H) 11.4 - 15.2 seconds   INR 2.10   Glucose, capillary     Status: None   Collection Time: 12/31/16  7:01 AM  Result Value Ref Range   Glucose-Capillary 99 65 - 99 mg/dL    Blood Alcohol level:  Lab Results  Component Value Date   ETH <5 12/27/2016   ETH <5 11/29/2016    Metabolic Disorder Labs: Lab Results  Component Value Date   HGBA1C 5.5 12/30/2016   MPG 111  12/30/2016   MPG 114 12/02/2016   Lab Results  Component Value Date   PROLACTIN 2.0 (L) 08/31/2016   PROLACTIN 4.8 05/22/2016   Lab Results  Component Value Date   CHOL 160 12/30/2016   TRIG 318 (H) 12/30/2016   HDL 37 (L) 12/30/2016   CHOLHDL 4.3 12/30/2016   VLDL 64 (H) 12/30/2016   LDLCALC 59 12/30/2016   LDLCALC 60 12/02/2016    Physical Findings: AIMS: Facial and Oral Movements Muscles of Facial Expression: None, normal Lips and Perioral Area: None, normal Jaw: None, normal Tongue: None, normal,Extremity Movements Upper (arms, wrists, hands, fingers): Minimal Lower (legs, knees, ankles, toes): Minimal, Trunk Movements Neck, shoulders, hips: None, normal, Overall Severity Severity of abnormal movements (highest score from questions above): Minimal Incapacitation due to  abnormal movements: Minimal Patient's awareness of abnormal movements (rate only patient's report): Aware, no distress, Dental Status Current problems with teeth and/or dentures?: Yes (poor hygiene, missing teeth) Does patient usually wear dentures?: No  CIWA:    COWS:     Musculoskeletal: Strength & Muscle Tone: within normal limits Gait & Station: normal Patient leans: N/A  Psychiatric Specialty Exam: Physical Exam  Constitutional: He is oriented to person, place, and time. He appears well-developed and well-nourished.  HENT:  Head: Normocephalic and atraumatic.  Eyes: Conjunctivae and EOM are normal.  Neck: Normal range of motion.  Respiratory: Effort normal.  Musculoskeletal: Normal range of motion.  Neurological: He is alert and oriented to person, place, and time.    Review of Systems  Constitutional: Negative.   HENT: Negative.   Eyes: Negative.   Respiratory: Negative.   Cardiovascular: Negative.   Gastrointestinal: Negative.   Genitourinary: Negative.   Musculoskeletal: Negative.   Skin: Negative.   Neurological: Negative.   Endo/Heme/Allergies: Negative.   Psychiatric/Behavioral: Negative.     Blood pressure (!) 123/55, pulse 76, temperature 97.7 F (36.5 C), temperature source Oral, resp. rate 17, height 5\' 11"  (1.803 m), weight 85.7 kg (189 lb), SpO2 99 %.Body mass index is 26.36 kg/m.  General Appearance: Disheveled  Eye Contact:  Good  Speech:  Clear and Coherent  Volume:  Normal  Mood:  Dysphoric  Affect:  Blunt  Thought Process:  Linear and Descriptions of Associations: Intact  Orientation:  Full (Time, Place, and Person)  Thought Content:  Hallucinations: None  Suicidal Thoughts:  No  Homicidal Thoughts:  No  Memory:  Immediate;   Fair Recent;   Fair Remote;   Fair  Judgement:  Impaired  Insight:  Shallow  Psychomotor Activity:  Decreased  Concentration:  Concentration: Fair and Attention Span: Fair  Recall:  [employer] of Knowledge:  Fair   Language:  Good  Akathisia:  No  Handed:    AIMS (if indicated):     Assets:  Architect Housing Social Support  ADL's:  Intact  Cognition:  WNL  Sleep:  Number of Hours: 7.3     Treatment Plan Summary:  Patient is a 41 year old single Caucasian male with schizoaffective disorder bipolar type. Patient presented to the emergency Department with worsening mood and SI  Schizoaffective disorder bipolar type: Patient will be continue on  Clozaril  500mg  qhs.    Continue wellbutrin XL 150 mg po q day for depression nd smoking cessation  Continue lithium CR 450 mg po bid--will check level in am  Insomnia: will target with clozaril as it is very sedating  History of pulmonary embolism: started back on coumadin today  Diabetes patient will be continued on metformin XR 500 mg po bid and levemir 14 U q am  Dyslipidemia continue Zocor 40 mg by mouth daily  COPD: continue with dulera and albuterol  Tobacco use disorder: Continue nicotine patch 21 mg a day.  Constipation: continue Colace 100 mg twice a day and MiraLAX by mouth daily  Sialorrhea: continue atroven sublingual  Dispo: back to the group home   Follow-up patient will follow up with [name]   Diet: carb modified and low sodium  7/19 Patient seems to be improving. We'll consider discharge in the next 2 days. Clozaril has been scheduled at 1800 instead of daily at bedtime as patient has been complaining of feeling overly sedated in the morning. Lithium level will be checked in the morning tomorrow.   Jimmy Footman, MD 12/31/2016, 12:50 PM

## 2016-12-31 NOTE — Progress Notes (Signed)
Dr. Ardyth HarpsHernandez is aware that patient requests to take Clozaril tonight, and confirms that the dose reschedule is ok.

## 2016-12-31 NOTE — Progress Notes (Signed)
   12/31/16 0215  Clinical Encounter Type  Visited With Patient  Visit Type Spiritual support  Referral From Patient   [facility]. Patient asked if group could be held outside. Patient was open and spoke about his goals of finding a new place to stay so that he could move forward.

## 2017-01-01 LAB — CBC WITH DIFFERENTIAL/PLATELET
BASOS PCT: 0 %
Basophils Absolute: 0 10*3/uL (ref 0–0.1)
EOS ABS: 0 10*3/uL (ref 0–0.7)
Eosinophils Relative: 0 %
HEMATOCRIT: 41.5 % (ref 40.0–52.0)
Hemoglobin: 14.4 g/dL (ref 13.0–18.0)
LYMPHS ABS: 1.2 10*3/uL (ref 1.0–3.6)
Lymphocytes Relative: 27 %
MCH: 31.4 pg (ref 26.0–34.0)
MCHC: 34.7 g/dL (ref 32.0–36.0)
MCV: 90.6 fL (ref 80.0–100.0)
MONO ABS: 0.6 10*3/uL (ref 0.2–1.0)
MONOS PCT: 14 %
Neutro Abs: 2.7 10*3/uL (ref 1.4–6.5)
Neutrophils Relative %: 59 %
Platelets: 187 10*3/uL (ref 150–440)
RBC: 4.58 MIL/uL (ref 4.40–5.90)
RDW: 13.8 % (ref 11.5–14.5)
WBC: 4.6 10*3/uL (ref 3.8–10.6)

## 2017-01-01 LAB — GLUCOSE, CAPILLARY: Glucose-Capillary: 110 mg/dL — ABNORMAL HIGH (ref 65–99)

## 2017-01-01 LAB — LITHIUM LEVEL: Lithium Lvl: 0.86 mmol/L (ref 0.60–1.20)

## 2017-01-01 LAB — PROTIME-INR
INR: 1.64
PROTHROMBIN TIME: 19.6 s — AB (ref 11.4–15.2)

## 2017-01-01 MED ORDER — WARFARIN SODIUM 5 MG PO TABS
5.0000 mg | ORAL_TABLET | Freq: Every day | ORAL | Status: DC
  Filled 2017-01-01: qty 1

## 2017-01-01 MED ORDER — WARFARIN SODIUM 5 MG PO TABS: 5.0000 mg | ORAL_TABLET | Freq: Every day | ORAL | 0 refills | Status: DC

## 2017-01-01 NOTE — Progress Notes (Signed)
ANTICOAGULATION CONSULT NOTE - Follow up Consult  Pharmacy Consult for warfarin dosing Indication: pulmonary embolus  Allergies  Allergen Reactions  . Penicillins Other (See Comments)    Reaction: "lockjaw" Has patient had a PCN reaction causing immediate rash, facial/tongue/throat swelling, SOB or lightheadedness with hypotension: No Has patient had a PCN reaction causing severe rash involving mucus membranes or skin necrosis: No Has patient had a PCN reaction that required hospitalization: No Has patient had a PCN reaction occurring within the last 10 years: No If all of the above answers are "NO", then may proceed with Cephalosporin use.     Patient Measurements: Height: 5\' 11"  (180.3 cm) Weight: 189 lb (85.7 kg) IBW/kg (Calculated) : 75.3 Heparin Dosing Weight: n/a  Vital Signs: Temp: 97.6 F (36.4 C) (07/20 0647) Temp Source: Oral (07/20 0647) BP: 96/50 (07/20 0647) Pulse Rate: 70 (07/20 0647)  Labs:  Recent Labs  12/30/16 0956 12/31/16 0701 01/01/17 0704 01/01/17 0705  HGB  --   --   --  14.4  HCT  --   --   --  41.5  PLT  --   --   --  187  LABPROT 33.8* 23.9* 19.6*  --   INR 3.24 2.10 1.64  --     Estimated Creatinine Clearance: 86.1 mL/min (by C-G formula based on SCr of 1.19 mg/dL).   Medical History: Past Medical History:  Diagnosis Date  . Depression   . Diabetes mellitus without complication (HCC)   . Hyperlipidemia   . Hypertension   . Lupus anticoagulant disorder (HCC) 06/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  . PE (pulmonary thromboembolism) (HCC)   . Schizo affective schizophrenia (HCC)   . Supratherapeutic INR 11/19/2016    Medications:  Home dose of warfarin 10 mg.  Assessment: INR is supratherapeutic this AM.   Date  INR  Coumadin Dose 7/16    10 mg  7/17  4.05   7/18  3.24                 2.5 mg 7/19                 2.10   2.5 mg 7/20                 1.62    Goal of Therapy:  INR 2-3    Plan:  Will increase  dose to 5 mg today and f/u AM INR.   Brighten Buzzelli K, RPH 01/01/2017,7:55 AM

## 2017-01-01 NOTE — Progress Notes (Signed)
CSW spoke with legal guardian, Lance [name], and informed her that pt is discharging today.  She reported no questions or concerns. Garner NashGregory Sibbie Flammia, MSW, LCSW Clinical Social Worker 01/01/2017 8:16 AM

## 2017-01-01 NOTE — [facility] Group Notes (Signed)
[facility] LCSW Group Therapy  01/01/2017 1:35 PM  Type of Therapy:  Group Therapy  Participation Level:  Minimal   Participation Quality:  Appropriate  Affect:  Flat  Cognitive:  Alert  Insight:  Limited  Engagement in Therapy:  Limited  Modes of Intervention:  Activity, Discussion, Education, Socialization and Support  Summary of Progress/Problems: Feelings around Relapse. Group members discussed the meaning of relapse and shared personal stories of relapse, how it affected them and others, and how they perceived themselves during this time. Group members were encouraged to identify triggers, warning signs and coping skills used when facing the possibility of relapse. Social supports were discussed and explored in detail. Pt attended group and stayed the entire time. Pt sat quietly and listened to other group members.   Akelia Husted L Monicka Cyran MSW, LCSW  01/01/2017, 1:35 PM

## 2017-01-01 NOTE — [facility] Suicide Risk Assessment (Signed)
[facility]   Principal Problem: Schizoaffective disorder, bipolar type [facility](HCC) Discharge Diagnoses:  Patient Active Problem List   Diagnosis Date Noted  . Tobacco use disorder [F17.200] 09/01/2016  . Schizoaffective disorder, bipolar type (HCC) [F25.0] 05/22/2016  . Diabetes mellitus without complication (HCC) [E11.9] 05/21/2016  . Hyperlipidemia [E78.5] 05/21/2016  . History of pulmonary embolism [Z86.711] 05/21/2016  . Chronic anticoagulation [Z79.01] 05/21/2016  . Hypertension [I10] 09/25/2015      Psychiatric Specialty Exam: ROS   Blood pressure (!) 96/50, pulse 70, temperature 97.6 F (36.4 C), temperature source Oral, resp. rate 16, height 5\' 11"  (1.803 m), weight 85.7 kg (189 lb), SpO2 98 %.Body mass index is 26.36 kg/m.                                                       Mental Status Per Nursing Assessment::   On Admission:  Self-harm thoughts, Suicidal ideation indicated by patient  Demographic Factors:  Male and Divorced or widowed  Loss Factors: Decrease in vocational status  Historical Factors: Impulsivity  Risk Reduction Factors:   Living with another person, especially a relative and Positive social support  No access to guns  Continued Clinical Symptoms:  Previous Psychiatric Diagnoses and Treatments  Cognitive Features That Contribute To Risk:  Closed-mindedness    Suicide Risk:  Minimal: No identifiable suicidal ideation.  Patients presenting with no risk factors but with morbid ruminations; may be classified as minimal risk based on the severity of the depressive symptoms  Follow-up Information    [facility] Follow up on 01/14/2018.   Why:  Thursday August 2 at 9:45 am Contact information: [address], [place], [place] [address]  Phone: [phone]          Jimmy FootmanHernandez-Gonzalez,  Aviya Jarvie, MD 01/01/2017, 10:00 AM

## 2017-01-01 NOTE — Progress Notes (Signed)
Pt initially irritable and dismissive towards this RN this morning, but affect and mood brightened.  Pt calm and cooperative.  Discharge orders placed.  RN notified [facility] from group home. Discharge instructions reviewed with pt and group home staff member.  Both verbalized understanding.  All belongings returned to patient.  Escorted off unit, no acute distress.

## 2017-01-01 NOTE — Progress Notes (Signed)
Pt appeared to sleep about 7 hours while monitored on 15 minute safety checks. 

## 2017-01-01 NOTE — [facility] Group Notes (Signed)
[facility] Group Notes:  (Nursing/MHT/Case Management/Adjunct)  Date:  01/01/2017  Time:  12:12 AM  Type of Therapy:  Psychoeducational Skills  Participation Level:  Active  Participation Quality:  Attentive  Affect:  Flat  Cognitive:  Oriented  Insight:  Good  Engagement in Group:  Limited  Modes of Intervention:  Clarification and Discussion  Summary of Progress/Problems:  Koray Soter R Madigan [employer] 01/01/2017, 12:12 AM

## 2017-01-01 NOTE — Progress Notes (Signed)
Pt visible on the periphery of unit.  Came to staff to receive medications and appeared paranoid of staff's intentions.  Upset MD ordered a med for drooling.  States "I do not have any of those problems."  Worried staff was not giving him the proper medication but was medication compliant when reassured.  Pt contracts for safety on the unit.  Appears preoccupied with internal stimuli. Appears slightly irritable upon approach.
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
## 2017-01-01 NOTE — [place] FL2 (Signed)
[place] MEDICAID FL2 LEVEL OF CARE SCREENING TOOL     IDENTIFICATION  Patient Name: Lance AlbrightJames P Simmers Birthdate: 26-Jan-1976 Sex: male Admission Date (Current Location): 12/29/2016  [place]  [place] [id number] [facility] Facility and Address:  [facility], [address], [place], [place] [address]      Provider Number: [id number]  Attending Physician Name and Address:  Van ClinesHernandez-Gonzalez, Andr*  Relative Name and Phone Number:       Current Level of Care: Hospital Recommended Level of Care: Family Care Home Prior Approval Number:    Date Approved/Denied:   PASRR Number:    Discharge Plan: Other (Comment)    Current Diagnoses: Patient Active Problem List   Diagnosis Date Noted  . Tobacco use disorder 09/01/2016  . Schizoaffective disorder, bipolar type (HCC) 05/22/2016  . Diabetes mellitus without complication (HCC) 05/21/2016  . Hyperlipidemia 05/21/2016  . History of pulmonary embolism 05/21/2016  . Chronic anticoagulation 05/21/2016  . Hypertension 09/25/2015    Orientation RESPIRATION BLADDER Height & Weight     Self, Time, Situation, Place  Normal Continent Weight: 189 lb (85.7 kg) Height:  5\' 11"  (180.3 cm)  BEHAVIORAL SYMPTOMS/MOOD NEUROLOGICAL BOWEL NUTRITION STATUS   (na)  (na) Continent  (na)  AMBULATORY STATUS COMMUNICATION OF NEEDS Skin   Independent Verbally Normal                       Personal Care Assistance Level of Assistance   (na) Bathing Assistance: Independent Feeding assistance: Independent Dressing Assistance: Independent     Functional Limitations Info   (na)          SPECIAL CARE FACTORS FREQUENCY   (na)                    Contractures Contractures Info: Not present    Additional Factors Info   (na)               Current Medications (01/01/2017):  This is the current hospital active medication list Current Facility-Administered Medications  Medication Dose Route  Frequency Provider Last Rate Last Dose  . acetaminophen (TYLENOL) tablet 650 mg  650 mg Oral Q6H PRN Clapacs, [name], MD      . albuterol (PROVENTIL HFA;VENTOLIN HFA) 108 (90 Base) MCG/ACT inhaler 1-2 puff  1-2 puff Inhalation Q4H PRN Jimmy FootmanHernandez-Gonzalez, Andrea, MD      . alum & mag hydroxide-simeth (MAALOX/MYLANTA) 200-200-20 MG/5ML suspension 30 mL  30 mL Oral Q4H PRN Clapacs, [name], MD      . buPROPion (WELLBUTRIN XL) 24 hr tablet 150 mg  150 mg Oral Daily Clapacs, Jackquline DenmarkJohn T, MD   150 mg at 01/01/17 0835  . cloZAPine (CLOZARIL) tablet 500 mg  500 mg Oral QHS Jimmy FootmanHernandez-Gonzalez, Andrea, MD   500 mg at 12/31/16 2113  . docusate sodium (COLACE) capsule 100 mg  100 mg Oral BID Jimmy FootmanHernandez-Gonzalez, Andrea, MD   100 mg at 01/01/17 0835  . feeding supplement (GLUCERNA SHAKE) (GLUCERNA SHAKE) liquid 237 mL  237 mL Oral BID BM Jimmy FootmanHernandez-Gonzalez, Andrea, MD   237 mL at 12/31/16 1400  . insulin detemir (LEVEMIR) injection 14 Units  14 Units Subcutaneous Bryn GullingBH-q7a Clapacs, Jackquline DenmarkJohn T, MD   14 Units at 01/01/17 [phone]  . ipratropium (ATROVENT) 0.06 % nasal spray 1 spray  1 spray Nasal QHS Hernandez-Gonzalez, Sue LushAndrea, MD      . lithium carbonate (ESKALITH) CR tablet 450 mg  450 mg Oral Q12H Clapacs, Jackquline DenmarkJohn T, MD  450 mg at 01/01/17 0835  . magnesium hydroxide (MILK OF MAGNESIA) suspension 30 mL  30 mL Oral Daily PRN Clapacs, [name], MD      . metFORMIN (GLUCOPHAGE-XR) 24 hr tablet 500 mg  500 mg Oral BID WC Clapacs, [name], MD   500 mg at 01/01/17 0835  . mometasone-formoterol (DULERA) 200-5 MCG/ACT inhaler 2 puff  2 puff Inhalation BID Jimmy Footman, MD   2 puff at 01/01/17 0835  . nicotine (NICODERM CQ - dosed in mg/24 hours) patch 21 mg  21 mg Transdermal Daily Jimmy Footman, MD   21 mg at 12/31/16 0819  . polyethylene glycol (MIRALAX / GLYCOLAX) packet 17 g  17 g Oral Daily Jimmy Footman, MD   17 g at 12/31/16 0817  . warfarin (COUMADIN) tablet 5 mg  5 mg Oral q1800  Jimmy Footman, MD      . Warfarin - Pharmacist Dosing Inpatient   Does not apply q1800 Valentina Gu, RPH   2.5 each at 12/30/16 1759     Discharge Medications: Please see discharge summary for a list of discharge medications.  Relevant Imaging Results:  Relevant Lab Results:   Additional Information na  Lorri Frederick, LCSW

## 2017-01-01 NOTE — Progress Notes (Signed)
  [facility] :  Will you be returning to the same living situation after discharge:  Yes,  group home At discharge, do you have transportation home?: Yes,  group home Do you have the ability to pay for your medications: Yes,  medicare/medicaid  Release of information consent forms completed and in the chart;  Patient's signature needed at discharge.  Patient to Follow up at: Follow-up Information    [facility] Follow up on 01/14/2018.   Why:  Thursday August 2 at 9:45 am Contact information: [address], [place], [place] [address]  Phone: [phone]       [facility] Follow up on 01/05/2017.   Why:  [facility] new address: [address], [place], [place] [address] for your follow up appointment on Tuesday, 01/05/17, at 10:00am.  Please bring a copy of your hospital discharge paperwork. Contact information: [address] [place] [address] [phone]           Next level of care provider [facility] Link:no  Safety Planning and Suicide Prevention discussed: Yes,  with guardian  Have you used any form of tobacco in the last 30 days? (Cigarettes, Smokeless Tobacco, Cigars, and/or Pipes): Yes  Has patient been referred to the Quitline?: Patient refused referral  Patient has been referred for addiction treatment: Yes  Lorri FrederickWierda, Rianna Lukes Jon, LCSW 01/01/2017, 12:46 PM

## 2017-01-12 DIAGNOSIS — I2782 Chronic pulmonary embolism: Secondary | ICD-10-CM | POA: Diagnosis not present

## 2017-01-12 DIAGNOSIS — F250 Schizoaffective disorder, bipolar type: Secondary | ICD-10-CM | POA: Diagnosis not present

## 2017-01-12 DIAGNOSIS — I10 Essential (primary) hypertension: Secondary | ICD-10-CM | POA: Diagnosis not present

## 2017-01-12 DIAGNOSIS — I2699 Other pulmonary embolism without acute cor pulmonale: Secondary | ICD-10-CM | POA: Diagnosis not present

## 2017-01-13 DIAGNOSIS — Z79899 Other long term (current) drug therapy: Secondary | ICD-10-CM | POA: Diagnosis not present

## 2017-01-13 DIAGNOSIS — F209 Schizophrenia, unspecified: Secondary | ICD-10-CM | POA: Diagnosis not present

## 2017-01-14 ENCOUNTER — Ambulatory Visit (INDEPENDENT_AMBULATORY_CARE_PROVIDER_SITE_OTHER): Payer: Medicare Other | Admitting: Podiatry

## 2017-01-14 ENCOUNTER — Encounter: Payer: Self-pay | Admitting: Podiatry

## 2017-01-14 DIAGNOSIS — Q828 Other specified congenital malformations of skin: Secondary | ICD-10-CM | POA: Diagnosis not present

## 2017-01-14 DIAGNOSIS — M79676 Pain in unspecified toe(s): Secondary | ICD-10-CM | POA: Diagnosis not present

## 2017-01-14 DIAGNOSIS — E119 Type 2 diabetes mellitus without complications: Secondary | ICD-10-CM | POA: Diagnosis not present

## 2017-01-14 DIAGNOSIS — B351 Tinea unguium: Secondary | ICD-10-CM | POA: Diagnosis not present

## 2017-01-14 NOTE — Progress Notes (Signed)
Complaint:  Visit Type: Patient returns to my office for continued preventative foot care services. Complaint: Patient states" my nails have grown long and thick and become painful to walk and wear shoes" Patient has been diagnosed with DM with no foot complications. The patient presents for preventative foot care services. No changes to ROS.  Patient also says he has painful callus.  Podiatric Exam: Vascular: dorsalis pedis and posterior tibial pulses are palpable bilateral. Capillary return is immediate. Temperature gradient is WNL. Skin turgor WNL  Sensorium: Normal Semmes Weinstein monofilament test. Normal tactile sensation bilaterally. Nail Exam: Pt has thick disfigured discolored nails with subungual debris noted bilateral entire nail second toenail  through fifth toenails Ulcer Exam: There is no evidence of ulcer or pre-ulcerative changes or infection. Orthopedic Exam: Muscle tone and strength are WNL. No limitations in general ROM. No crepitus or effusions noted. Foot type and digits show no abnormalities. Bony prominences are unremarkable. Skin: No Porokeratosis. No infection or ulcers.  Pinch callus.  Sub 4 porokeratosis right foot.  Diagnosis:  Onychomycosis, , Pain in right toe, pain in left toes  Treatment & Plan Procedures and Treatment: Consent by patient was obtained for treatment procedures. The patient understood the discussion of treatment and procedures well. All questions were answered thoroughly reviewed. Debridement of mycotic and hypertrophic toenails, 1 through 5 bilateral and clearing of subungual debris. No ulceration, no infection noted. Debride callus. Return Visit-Office Procedure: Patient instructed to return to the office for a follow up visit 3 months for continued evaluation and treatment.    Helane GuntherGregory Solenne Manwarren DPM

## 2017-02-04 DIAGNOSIS — F250 Schizoaffective disorder, bipolar type: Secondary | ICD-10-CM | POA: Diagnosis not present

## 2017-02-04 DIAGNOSIS — E784 Other hyperlipidemia: Secondary | ICD-10-CM | POA: Diagnosis not present

## 2017-02-04 DIAGNOSIS — F3131 Bipolar disorder, current episode depressed, mild: Secondary | ICD-10-CM | POA: Diagnosis not present

## 2017-02-04 DIAGNOSIS — I2699 Other pulmonary embolism without acute cor pulmonale: Secondary | ICD-10-CM | POA: Diagnosis not present

## 2017-02-18 DIAGNOSIS — Z79899 Other long term (current) drug therapy: Secondary | ICD-10-CM | POA: Diagnosis not present

## 2017-02-18 DIAGNOSIS — F209 Schizophrenia, unspecified: Secondary | ICD-10-CM | POA: Diagnosis not present

## 2017-02-26 ENCOUNTER — Emergency Department
Admission: EM | Admit: 2017-02-26 | Discharge: 2017-03-02 | Disposition: A | Discharge status: Other Acute Inpatient Hospital | Payer: Medicare Other | Attending: Emergency Medicine | Admitting: Emergency Medicine

## 2017-02-26 ENCOUNTER — Encounter: Payer: Self-pay | Admitting: Emergency Medicine

## 2017-02-26 DIAGNOSIS — E119 Type 2 diabetes mellitus without complications: Secondary | ICD-10-CM

## 2017-02-26 DIAGNOSIS — E785 Hyperlipidemia, unspecified: Secondary | ICD-10-CM | POA: Diagnosis present

## 2017-02-26 DIAGNOSIS — F319 Bipolar disorder, unspecified: Secondary | ICD-10-CM | POA: Diagnosis present

## 2017-02-26 DIAGNOSIS — F250 Schizoaffective disorder, bipolar type: Secondary | ICD-10-CM | POA: Diagnosis present

## 2017-02-26 DIAGNOSIS — Z86711 Personal history of pulmonary embolism: Secondary | ICD-10-CM | POA: Diagnosis present

## 2017-02-26 DIAGNOSIS — Z79899 Other long term (current) drug therapy: Secondary | ICD-10-CM | POA: Diagnosis not present

## 2017-02-26 DIAGNOSIS — Z7984 Long term (current) use of oral hypoglycemic drugs: Secondary | ICD-10-CM | POA: Diagnosis not present

## 2017-02-26 DIAGNOSIS — F17210 Nicotine dependence, cigarettes, uncomplicated: Secondary | ICD-10-CM | POA: Insufficient documentation

## 2017-02-26 DIAGNOSIS — R45850 Homicidal ideations: Secondary | ICD-10-CM | POA: Diagnosis not present

## 2017-02-26 DIAGNOSIS — F17200 Nicotine dependence, unspecified, uncomplicated: Secondary | ICD-10-CM | POA: Diagnosis present

## 2017-02-26 DIAGNOSIS — F259 Schizoaffective disorder, unspecified: Secondary | ICD-10-CM | POA: Diagnosis not present

## 2017-02-26 DIAGNOSIS — Z7901 Long term (current) use of anticoagulants: Secondary | ICD-10-CM | POA: Diagnosis not present

## 2017-02-26 DIAGNOSIS — I10 Essential (primary) hypertension: Secondary | ICD-10-CM | POA: Diagnosis not present

## 2017-02-26 LAB — PROTIME-INR
INR: 0.92
Prothrombin Time: 12.3 seconds (ref 11.4–15.2)

## 2017-02-26 LAB — COMPREHENSIVE METABOLIC PANEL
ALT: 27 U/L (ref 17–63)
AST: 30 U/L (ref 15–41)
Albumin: 4.6 g/dL (ref 3.5–5.0)
Alkaline Phosphatase: 58 U/L (ref 38–126)
Anion gap: 11 (ref 5–15)
BUN: 19 mg/dL (ref 6–20)
CHLORIDE: 105 mmol/L (ref 101–111)
CO2: 21 mmol/L — ABNORMAL LOW (ref 22–32)
Calcium: 9.7 mg/dL (ref 8.9–10.3)
Creatinine, Ser: 0.9 mg/dL (ref 0.61–1.24)
Glucose, Bld: 217 mg/dL — ABNORMAL HIGH (ref 65–99)
POTASSIUM: 4.1 mmol/L (ref 3.5–5.1)
Sodium: 137 mmol/L (ref 135–145)
Total Bilirubin: 0.6 mg/dL (ref 0.3–1.2)
Total Protein: 7.2 g/dL (ref 6.5–8.1)

## 2017-02-26 LAB — SALICYLATE LEVEL

## 2017-02-26 LAB — URINE DRUG SCREEN, QUALITATIVE (ARMC ONLY)
AMPHETAMINES, UR SCREEN: NOT DETECTED
Barbiturates, Ur Screen: NOT DETECTED
Benzodiazepine, Ur Scrn: NOT DETECTED
Cannabinoid 50 Ng, Ur ~~LOC~~: NOT DETECTED
Cocaine Metabolite,Ur ~~LOC~~: NOT DETECTED
MDMA (ECSTASY) UR SCREEN: NOT DETECTED
METHADONE SCREEN, URINE: NOT DETECTED
Opiate, Ur Screen: NOT DETECTED
Phencyclidine (PCP) Ur S: NOT DETECTED
TRICYCLIC, UR SCREEN: NOT DETECTED

## 2017-02-26 LAB — ETHANOL

## 2017-02-26 LAB — CBC
HCT: 44.3 % (ref 40.0–52.0)
Hemoglobin: 15.6 g/dL (ref 13.0–18.0)
MCH: 32 pg (ref 26.0–34.0)
MCHC: 35.1 g/dL (ref 32.0–36.0)
MCV: 91.2 fL (ref 80.0–100.0)
PLATELETS: 225 10*3/uL (ref 150–440)
RBC: 4.85 MIL/uL (ref 4.40–5.90)
RDW: 13.8 % (ref 11.5–14.5)
WBC: 6.8 10*3/uL (ref 3.8–10.6)

## 2017-02-26 LAB — ACETAMINOPHEN LEVEL: Acetaminophen (Tylenol), Serum: 11 ug/mL (ref 10–30)

## 2017-02-26 LAB — LITHIUM LEVEL: LITHIUM LVL: 0.8 mmol/L (ref 0.60–1.20)

## 2017-02-26 NOTE — BH Assessment (Signed)
Assessment Note  Lance [name] is an 41 y.o. male, with a history of depression and schizophrenia, presenting to the ED with concerns of auditory hallucinations telling him to hurt others.  Patient reports having anxieties aboutt being around people and having urges to hurt someone.  Pt states he does not want to hurt anyone and does not report a specific plan to do so.  Pt denies having any weapons.  Pt states that his medications have been changed but says the only medication he takes is Trazodone to help him sleep.  He denies SI.  Pt denies current drug/alcohol use.  Diagnosis: Depression  Past Medical History:  Past Medical History:  Diagnosis Date  . Depression   . Diabetes mellitus without complication (HCC)   . Hyperlipidemia   . Hypertension   . Lupus anticoagulant disorder (HCC) 06/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  . PE (pulmonary thromboembolism) (HCC)   . Schizo affective schizophrenia (HCC)   . Supratherapeutic INR 11/19/2016    History reviewed. No pertinent surgical history.  Family History:  Family History  Problem Relation Age of Onset  . CAD Mother   . CAD Sister     Social History:  reports that he has been smoking Cigarettes.  He has a 23.00 pack-year smoking history. He has never used smokeless tobacco. He reports that he uses drugs, including Marijuana. He reports that he does not drink alcohol.  Additional Social History:  Alcohol / Drug Use Pain Medications: See PTA Prescriptions: See PTA Over the Counter: See PTA History of alcohol / drug use?: No history of alcohol / drug abuse  CIWA: CIWA-Ar BP: 123/83 Pulse Rate: 90 COWS:    Allergies:  Allergies  Allergen Reactions  . Penicillins Other (See Comments)    Reaction: "lockjaw" Has patient had a PCN reaction causing immediate rash, facial/tongue/throat swelling, SOB or lightheadedness with hypotension: No Has patient had a PCN reaction causing severe rash involving mucus membranes or  skin necrosis: No Has patient had a PCN reaction that required hospitalization: No Has patient had a PCN reaction occurring within the last 10 years: No If all of the above answers are "NO", then may proceed with Cephalosporin use.     Home Medications:  (Not in a hospital admission)  OB/GYN Status:  No LMP for male patient.  General Assessment Data Location of Assessment: [facility] ED TTS Assessment: In system Is this a Tele or Face-to-Face Assessment?: Face-to-Face Is this an Initial Assessment or a Re-assessment for this encounter?: Initial Assessment Marital status: Single Maiden name: n/a Is patient pregnant?: Other (Comment) (pt is a male) Pregnancy Status: Other (Comment) (pt is a male) Living Arrangements: Group Home Can pt return to current living arrangement?: Yes Admission Status: Voluntary Is patient capable of signing voluntary admission?: Yes Referral Source: Self/Family/Friend Insurance type: Medicare     Crisis Care Plan Living Arrangements: Group Home Legal Guardian: Other: [name], [phone]) Name of Psychiatrist: PSi Name of Therapist: PSI  Education Status Is patient currently in school?: No Current Grade: n/a Highest grade of school patient has completed: 12 Name of school: n/a Contact person: n/a  Risk to self with the past 6 months Suicidal Ideation: No Has patient been a risk to self within the past 6 months prior to admission? : No Suicidal Intent: No Has patient had any suicidal intent within the past 6 months prior to admission? : No Is patient at risk for suicide?: No Suicidal Plan?: No Has  patient had any suicidal plan within the past 6 months prior to admission? : No Access to Means: No What has been your use of drugs/alcohol within the last 12 months?: Pt denies use Previous Attempts/Gestures: No Other Self Harm Risks: none identified Triggers for Past Attempts: Hallucinations Intentional Self Injurious Behavior: None Family  Suicide History: No Recent stressful life event(s): Other (Comment) Persecutory voices/beliefs?: Yes Depression: Yes Depression Symptoms: Loss of interest in usual pleasures, Feeling worthless/self pity, Feeling angry/irritable Substance abuse history and/or treatment for substance abuse?: No Suicide prevention information given to non-admitted patients: Not applicable  Risk to Others within the past 6 months Homicidal Ideation: Yes-Currently Present Does patient have any lifetime risk of violence toward others beyond the six months prior to admission? : No Thoughts of Harm to Others: Yes-Currently Present Comment - Thoughts of Harm to Others: pt reports AH telling him to hurt others Current Homicidal Intent: No Current Homicidal Plan: No Access to Homicidal Means: No Identified Victim: none identified History of harm to others?: No Assessment of Violence: None Noted Does patient have access to weapons?: No Criminal Charges Pending?: No Does patient have a court date: No Is patient on probation?: No  Psychosis Hallucinations: Auditory Delusions: None noted  Mental Status Report Appearance/Hygiene: In scrubs Eye Contact: Good Motor Activity: Freedom of movement Speech: Logical/coherent Level of Consciousness: Alert Mood: Depressed Affect: Appropriate to circumstance, Depressed Anxiety Level: Minimal Thought Processes: Coherent, Relevant Judgement: Unimpaired Orientation: Person, Place, Time, Situation Obsessive Compulsive Thoughts/Behaviors: None  Cognitive Functioning Concentration: Normal Memory: Recent Intact, Remote Intact IQ: Average Insight: Fair Impulse Control: Fair Appetite: Good Weight Loss: 0 Weight Gain: 0 Sleep: No Change Total Hours of Sleep: 8 Vegetative Symptoms: None  ADLScreening [facility] Assessment Services) Patient's cognitive ability adequate to safely complete daily activities?: Yes Patient able to express need for assistance with ADLs?:  Yes Independently performs ADLs?: Yes (appropriate for developmental age)  Prior Inpatient Therapy Prior Inpatient Therapy: Yes Prior Therapy Dates: 12/2016 Prior Therapy Facilty/Provider(s): [facility] Reason for Treatment: depression  Prior Outpatient Therapy Prior Outpatient Therapy: Yes Prior Therapy Dates: current Prior Therapy Facilty/Provider[facility]): [facility] Reason for Treatment: schizophrenia Does patient have an ACCT team?: Yes Does patient have Intensive In-House Services?  : No Does patient have [facility] services? : No Does patient have P4CC services?: No  ADL Screening (condition at time of admission) Patient's cognitive ability adequate to safely complete daily activities?: Yes Patient able to express need for assistance with ADLs?: Yes Independently performs ADLs?: Yes (appropriate for developmental age)       Abuse/Neglect Assessment (Assessment to be complete while patient is alone) Physical Abuse: Denies Verbal Abuse: Denies Sexual Abuse: Denies Exploitation of patient/patient's resources: Denies Self-Neglect: Denies Values / Beliefs Cultural Requests During Hospitalization: None Spiritual Requests During Hospitalization: None Consults Spiritual Care Consult Needed: No Social Work Consult Needed: No Merchant navy officer (For Healthcare) Does Patient Have a Medical Advance Directive?: No Would patient like information on creating a medical advance directive?: No - Patient declined    Additional Information 1:1 In Past 12 Months?: No CIRT Risk: No Elopement Risk: No Does patient have medical clearance?: Yes     Disposition:  Disposition Initial Assessment Completed for this Encounter: Yes Disposition of Patient: Other dispositions Other disposition(s): Other (Comment) (Pending Psych MD consult)  On Site Evaluation by:   Reviewed with Physician:    Artist [place] 02/26/2017 9:45 PM

## 2017-02-26 NOTE — ED Provider Notes (Signed)
[name] Emergency Department Provider Note   ____________________________________________   First MD Initiated Contact with Patient 02/26/17 1839     (approximate)  I have reviewed the triage vital signs and the nursing notes.   HISTORY  Chief Complaint Homicidal   HPI Lance [name] is a 41 y.o. male Reports he's having thoughts of wanting to hurt people. He went to the clinic when there was a lot of people there and just was very uncomfortable and had to leave because he was thinking of hurting people. He told the nurse that hearing people screaming when he is in the shower and he knows nobody is there. He said there is a lady gives him his medicines and he keeps on thinking of hurting her he can't stop himself from thinking that. He is very worried.   Past Medical History:  Diagnosis Date  . Depression   . Diabetes mellitus without complication (HCC)   . Hyperlipidemia   . Hypertension   . Lupus anticoagulant disorder (HCC) 06/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  . PE (pulmonary thromboembolism) (HCC)   . Schizo affective schizophrenia (HCC)   . Supratherapeutic INR 11/19/2016    Patient Active Problem List   Diagnosis Date Noted  . Tobacco use disorder 09/01/2016  . Schizoaffective disorder, bipolar type (HCC) 05/22/2016  . Diabetes mellitus without complication (HCC) 05/21/2016  . Hyperlipidemia 05/21/2016  . History of pulmonary embolism 05/21/2016  . Chronic anticoagulation 05/21/2016  . Hypertension 09/25/2015    History reviewed. No pertinent surgical history.  Prior to Admission medications   Medication Sig Start Date End Date Taking? Authorizing Provider  albuterol (PROVENTIL HFA;VENTOLIN HFA) 108 (90 Base) MCG/ACT inhaler Inhale 1-2 puffs into the lungs every 4 (four) hours as needed for wheezing or shortness of breath. 12/31/16  Yes Jimmy Footman, MD  buPROPion (WELLBUTRIN XL) 150 MG 24 hr tablet Take 1  tablet (150 mg total) by mouth daily. 09/15/16  Yes Jimmy Footman, MD  cloZAPine (CLOZARIL) 100 MG tablet Take 5 tablets (500 mg total) by mouth at bedtime. 12/03/16  Yes Jimmy Footman, MD  docusate sodium (COLACE) 100 MG capsule Take 2 capsules (200 mg total) by mouth 2 (two) times daily. 09/14/16  Yes Jimmy Footman, MD  Fluticasone-Salmeterol (ADVAIR) 250-50 MCG/DOSE AEPB Inhale 1 puff into the lungs 2 (two) times daily.   Yes [provider]  insulin detemir (LEVEMIR) 100 UNIT/ML injection Inject 14 Units into the skin every morning.   Yes [provider]  ipratropium (ATROVENT) 0.06 % nasal spray Place 1 spray into both nostrils at bedtime. 09/14/16  Yes Jimmy Footman, MD  lisinopril (PRINIVIL,ZESTRIL) 2.5 MG tablet Take 2.5 mg by mouth daily.   Yes [provider]  lithium carbonate (ESKALITH) 450 MG CR tablet Take 1 tablet (450 mg total) by mouth every 12 (twelve) hours. 09/14/16  Yes Jimmy Footman, MD  metformin (FORTAMET) 500 MG (OSM) 24 hr tablet Take 500 mg by mouth 2 (two) times daily with a meal.   Yes [provider]  mometasone-formoterol (DULERA) 200-5 MCG/ACT AERO Inhale 2 puffs into the lungs 2 (two) times daily. 11/20/16  Yes Altamese Dilling, MD  omega-3 acid ethyl esters (LOVAZA) 1 g capsule Take 1 g by mouth daily.   Yes [provider]  polyethylene glycol (MIRALAX / GLYCOLAX) packet Take 17 g by mouth daily. 09/15/16  Yes Jimmy Footman, MD  simvastatin (ZOCOR) 40 MG tablet Take 40 mg by mouth  at bedtime.   Yes [provider]  traZODone (DESYREL) 50 MG tablet Take 75 mg by mouth at bedtime.   Yes [provider]  warfarin (COUMADIN) 10 MG tablet Take 10 mg by mouth daily.   Yes [provider]  warfarin (COUMADIN) 5 MG tablet Take 1 tablet (5 mg total) by mouth daily at 6 PM. 01/01/17  Yes Jimmy Footman, MD     Allergies Penicillins  Family History  Problem Relation Age of Onset  . CAD Mother   . CAD Sister     Social History Social History  Substance Use Topics  . Smoking status: Current Every Day Smoker    Packs/day: 1.00    Years: 23.00    Types: Cigarettes  . Smokeless tobacco: Never Used  . Alcohol use No     Comment: occassionally    Review of Systems  Constitutional: No fever/chills Eyes: No visual changes. ENT: No sore throat. Cardiovascular: Denies chest pain. Respiratory: Denies shortness of breath. Gastrointestinal: No abdominal pain.  No nausea, no vomiting.  No diarrhea.  No constipation. Genitourinary: Negative for dysuria. Musculoskeletal: Negative for back pain. Skin: Negative for rash. Neurological: Negative for headaches, focal weakness   ____________________________________________   PHYSICAL EXAM:  VITAL SIGNS: ED Triage Vitals [02/26/17 1803]  Enc Vitals Group     BP (!) 154/78     Pulse Rate 97     Resp 17     Temp 97.8 F (36.6 C)     Temp Source Oral     SpO2 97 %     Weight 190 lb (86.2 kg)     Height  (1.778 m)     Head Circumference      Peak Flow      Pain Score      Pain Loc      Pain Edu?      Excl. in GC?    Constitutional: Alert and oriented. Well appearing and in no acute distress. Eyes: Conjunctivae are normal.  Head: Atraumatic. Nose: No congestion/rhinnorhea. Mouth/Throat: Mucous membranes are moist.  Oropharynx non-erythematous. Neck: No stridor Cardiovascular: Normal rate, regular rhythm. Grossly normal heart sounds.  Good peripheral circulation. Respiratory: Normal respiratory effort.  No retractions. Lungs CTAB. Gastrointestinal: Soft and nontender. No distention. No abdominal bruits. No CVA tenderness. Musculoskeletal: No lower extremity tenderness nor edema.  No joint effusions. Neurologic:  Normal speech and language. No gross focal neurologic deficits are appreciated. No gait instability. Skin:  Skin  is warm, dry and intact. No rash noted.   ____________________________________________   LABS (all labs ordered are listed, but only abnormal results are displayed)  Labs Reviewed  COMPREHENSIVE METABOLIC PANEL - Abnormal; Notable for the following:       Result Value   CO2 21 (*)    Glucose, Bld 217 (*)    All other components within normal limits  GLUCOSE, CAPILLARY - Abnormal; Notable for the following:    Glucose-Capillary 111 (*)    All other components within normal limits  ETHANOL  SALICYLATE LEVEL  ACETAMINOPHEN LEVEL  CBC  URINE DRUG SCREEN, QUALITATIVE ([facility] ONLY)  PROTIME-INR  LITHIUM LEVEL  DIFFERENTIAL  CBG MONITORING, ED  CBG MONITORING, ED   ____________________________________________  EKG   ____________________________________________  RADIOLOGY  No results found.  ____________________________________________   PROCEDURES  Procedure(s) performed:  Procedures  Critical Care performed:   ____________________________________________   INITIAL IMPRESSION / ASSESSMENT AND PLAN / ED COURSE  Pertinent labs & imaging results that  were available during my care of the patient were reviewed by me and considered in my medical decision making (see chart for details).        ____________________________________________   FINAL CLINICAL IMPRESSION(S) / ED DIAGNOSES  Final diagnoses:  Schizoaffective disorder, unspecified type (HCC)      NEW MEDICATIONS STARTED DURING THIS VISIT:  New Prescriptions   No medications on file     Note:  This document was prepared using Dragon voice recognition software and may include unintentional dictation errors.    Arnaldo Natal, MD 02/27/17 [phone]

## 2017-02-26 NOTE — ED Notes (Signed)
SOC att report

## 2017-02-26 NOTE — ED Notes (Signed)
Doristine Mango [phone], care giver states to call him to give ride back.

## 2017-02-26 NOTE — ED Notes (Signed)
PT IVC/ PENDING PLACEMENT  

## 2017-02-26 NOTE — ED Notes (Signed)
Pt wanded by security. 

## 2017-02-26 NOTE — ED Triage Notes (Signed)
Pt dressed out by this RN and officer taylor with [place] [employer].

## 2017-02-26 NOTE — ED Triage Notes (Signed)
Patient presents to ED voluntary. Patient states, "I am just having thoughts of wanting to hurt people". Patient denies SI. Patient states, "when I am in the shower I can hear people screaming and I know they are not there". Patient cooperative in triage.

## 2017-02-26 NOTE — ED Notes (Signed)
Call to lab - no blue top, will pull from pt

## 2017-02-26 NOTE — ED Notes (Signed)
IVC PENDING SOC 

## 2017-02-27 LAB — DIFFERENTIAL
BASOS ABS: 0 10*3/uL (ref 0–0.1)
BASOS PCT: 1 %
EOS ABS: 0 10*3/uL (ref 0–0.7)
EOS PCT: 0 %
Lymphocytes Relative: 28 %
Lymphs Abs: 1.9 10*3/uL (ref 1.0–3.6)
MONO ABS: 0.7 10*3/uL (ref 0.2–1.0)
MONOS PCT: 10 %
Neutro Abs: 4.2 10*3/uL (ref 1.4–6.5)
Neutrophils Relative %: 61 %

## 2017-02-27 LAB — GLUCOSE, CAPILLARY: Glucose-Capillary: 111 mg/dL — ABNORMAL HIGH (ref 65–99)

## 2017-02-27 MED ORDER — MOMETASONE FURO-FORMOTEROL FUM 200-5 MCG/ACT IN AERO
2.0000 | INHALATION_SPRAY | Freq: Two times a day (BID) | RESPIRATORY_TRACT | Status: DC
  Administered 2017-02-27 – 2017-03-02 (×5): 2 via RESPIRATORY_TRACT
  Filled 2017-02-27: qty 8.8

## 2017-02-27 MED ORDER — IPRATROPIUM BROMIDE 0.06 % NA SOLN
1.0000 | Freq: Every day | NASAL | Status: DC
  Administered 2017-02-27 – 2017-03-01 (×3): 1 via NASAL
  Filled 2017-02-27: qty 15

## 2017-02-27 MED ORDER — CLOZAPINE 100 MG PO TABS
500.0000 mg | ORAL_TABLET | Freq: Every day | ORAL | Status: DC
  Administered 2017-02-27 – 2017-03-01 (×3): 500 mg via ORAL
  Filled 2017-02-27 (×3): qty 5

## 2017-02-27 MED ORDER — LORAZEPAM 2 MG PO TABS
2.0000 mg | ORAL_TABLET | Freq: Four times a day (QID) | ORAL | Status: DC | PRN
  Administered 2017-02-27 – 2017-03-01 (×4): 2 mg via ORAL
  Filled 2017-02-27 (×4): qty 1

## 2017-02-27 MED ORDER — INSULIN DETEMIR 100 UNIT/ML ~~LOC~~ SOLN
14.0000 [IU] | SUBCUTANEOUS | Status: DC
  Administered 2017-02-27 – 2017-03-02 (×4): 14 [IU] via SUBCUTANEOUS
  Filled 2017-02-27 (×5): qty 0.14

## 2017-02-27 MED ORDER — ALBUTEROL SULFATE HFA 108 (90 BASE) MCG/ACT IN AERS
1.0000 | INHALATION_SPRAY | RESPIRATORY_TRACT | Status: DC | PRN
  Filled 2017-02-27: qty 6.7

## 2017-02-27 MED ORDER — ZIPRASIDONE MESYLATE 20 MG IM SOLR: 20.0000 mg | Freq: Two times a day (BID) | INTRAMUSCULAR | Status: DC | PRN

## 2017-02-27 MED ORDER — POLYETHYLENE GLYCOL 3350 17 G PO PACK: 17.0000 g | PACK | Freq: Every day | ORAL | Status: DC

## 2017-02-27 MED ORDER — SIMVASTATIN 40 MG PO TABS
40.0000 mg | ORAL_TABLET | Freq: Every day | ORAL | Status: DC
  Administered 2017-02-27 – 2017-03-01 (×3): 40 mg via ORAL
  Filled 2017-02-27 (×3): qty 1

## 2017-02-27 MED ORDER — METFORMIN HCL ER 500 MG PO TB24
500.0000 mg | ORAL_TABLET | Freq: Two times a day (BID) | ORAL | Status: DC
  Administered 2017-02-27 – 2017-03-02 (×6): 500 mg via ORAL
  Filled 2017-02-27 (×9): qty 1

## 2017-02-27 MED ORDER — BUPROPION HCL ER (XL) 150 MG PO TB24
150.0000 mg | ORAL_TABLET | Freq: Every day | ORAL | Status: DC
  Administered 2017-02-27 – 2017-03-02 (×4): 150 mg via ORAL
  Filled 2017-02-27 (×4): qty 1

## 2017-02-27 MED ORDER — LITHIUM CARBONATE ER 450 MG PO TBCR
450.0000 mg | EXTENDED_RELEASE_TABLET | Freq: Two times a day (BID) | ORAL | Status: DC
  Administered 2017-02-27 – 2017-03-02 (×7): 450 mg via ORAL
  Filled 2017-02-27 (×7): qty 1

## 2017-02-27 MED ORDER — IBUPROFEN 600 MG PO TABS
600.0000 mg | ORAL_TABLET | Freq: Three times a day (TID) | ORAL | Status: DC | PRN
  Administered 2017-02-27 – 2017-03-01 (×4): 600 mg via ORAL
  Filled 2017-02-27 (×3): qty 1

## 2017-02-27 MED ORDER — WARFARIN SODIUM 5 MG PO TABS
5.0000 mg | ORAL_TABLET | Freq: Every day | ORAL | Status: DC
  Administered 2017-02-27 – 2017-03-01 (×3): 5 mg via ORAL
  Filled 2017-02-27 (×4): qty 1

## 2017-02-27 MED ORDER — DOCUSATE SODIUM 100 MG PO CAPS
200.0000 mg | ORAL_CAPSULE | Freq: Two times a day (BID) | ORAL | Status: DC
  Administered 2017-02-27 – 2017-03-02 (×7): 200 mg via ORAL
  Filled 2017-02-27 (×11): qty 2

## 2017-02-27 MED ORDER — WARFARIN - PHYSICIAN DOSING INPATIENT
Freq: Every day | Status: DC
  Filled 2017-02-27 (×5): qty 1

## 2017-02-27 MED ORDER — IBUPROFEN 600 MG PO TABS
ORAL_TABLET | ORAL | Status: AC
  Administered 2017-02-27: 600 mg via ORAL
  Filled 2017-02-27: qty 1

## 2017-02-27 NOTE — ED Notes (Signed)
Pt co pain to his left back lower tooth. States he has a cavity in it and it is keeping him from sleeping. Dr York Cerise notified and new order received.

## 2017-02-27 NOTE — ED Notes (Signed)
Patient resting quietly in room. No noted distress or abnormal behaviors noted. Will continue 15 minute checks and observation by security camera for safety. 

## 2017-02-27 NOTE — ED Notes (Signed)
Pt currently taking a shower. Maintained on 15 minute checks and observation by security camera for safety.

## 2017-02-27 NOTE — Progress Notes (Signed)
Pharmacy Consult for Clozapine Monitoring  Patient ordered  clozapine at bedtime.  9/14  ANC= 4.2  Labs submitted to Clozapine [facility]S program and patient eligible to receive clozapine. Check labs weekly while inpatient. Next lab on 9/21.  Bari Mantis PharmD Clinical Pharmacist 02/27/2017

## 2017-02-27 NOTE — ED Notes (Signed)
Pt. Alert and oriented, warm and dry, in no distress. Pt. Denies HI, and AVH. Pt continues to state he is wanting to be with dead sister. Patient states no plan and is able to contract for safety with this Clinical research associate. Sandwich tray and diet soda given. Pt. Encouraged to let nursing staff know of any concerns or needs.

## 2017-02-27 NOTE — ED Notes (Signed)
Pt. To BHU from ED ambulatory without difficulty, to room  BHU4. Report from [facility]. Pt. Is alert and oriented, warm and dry in no distress. Pt. Denies SI, HI, and AVH. Pt would not come out and say he was having SI but would state he wants to see his dead sister and be with her. Patient able to contact for safety with this Clinical research associate.  Pt. Calm and cooperative. Pt. Made aware of security cameras and Q15 minute rounds. Pt. Encouraged to let Nursing staff know of any concerns or needs.

## 2017-02-27 NOTE — ED Notes (Signed)
Patient redirected on masturbating. Explained to patient that there is camera in room and his door is wide open and there is other patients on unit (male).

## 2017-02-27 NOTE — ED Notes (Signed)
Report received from [facility]. Patient to be moved to [facility] room 4.

## 2017-02-27 NOTE — ED Provider Notes (Signed)
-----------------------------------------   4:43 AM on 02/27/2017 -----------------------------------------   Blood pressure (!) 130/96, pulse 90, temperature 98 F (36.7 C), temperature source Oral, resp. rate 18, height 1.778 m ( ), weight 86.2 kg (190 lb), SpO2 97 %.  The patient had no acute events since last update.  Calm and cooperative at this time.  Pending placement.   Loleta Rose, MD 02/27/17 [phone]

## 2017-02-27 NOTE — ED Notes (Signed)
Patient asleep in room. No noted distress or abnormal behavior. Will continue 15 minute checks and observation by security cameras for safety. 

## 2017-02-27 NOTE — ED Notes (Signed)

## 2017-02-27 NOTE — ED Notes (Signed)
Pt eating lunch. Calm and cooperative. No needs or concerns at this time. Maintained on 15 minute checks and observation by security camera for safety.

## 2017-02-28 DIAGNOSIS — Z7984 Long term (current) use of oral hypoglycemic drugs: Secondary | ICD-10-CM | POA: Diagnosis not present

## 2017-02-28 DIAGNOSIS — Z79899 Other long term (current) drug therapy: Secondary | ICD-10-CM | POA: Diagnosis not present

## 2017-02-28 DIAGNOSIS — F17210 Nicotine dependence, cigarettes, uncomplicated: Secondary | ICD-10-CM | POA: Diagnosis not present

## 2017-02-28 DIAGNOSIS — F259 Schizoaffective disorder, unspecified: Secondary | ICD-10-CM | POA: Diagnosis not present

## 2017-02-28 DIAGNOSIS — E119 Type 2 diabetes mellitus without complications: Secondary | ICD-10-CM | POA: Diagnosis not present

## 2017-02-28 DIAGNOSIS — R45850 Homicidal ideations: Secondary | ICD-10-CM | POA: Diagnosis not present

## 2017-02-28 DIAGNOSIS — Z7901 Long term (current) use of anticoagulants: Secondary | ICD-10-CM | POA: Diagnosis not present

## 2017-02-28 DIAGNOSIS — F250 Schizoaffective disorder, bipolar type: Secondary | ICD-10-CM | POA: Diagnosis not present

## 2017-02-28 DIAGNOSIS — I10 Essential (primary) hypertension: Secondary | ICD-10-CM | POA: Diagnosis not present

## 2017-02-28 LAB — PROTIME-INR
INR: 1.01
Prothrombin Time: 13.2 seconds (ref 11.4–15.2)

## 2017-02-28 LAB — GLUCOSE, CAPILLARY: GLUCOSE-CAPILLARY: 171 mg/dL — AB (ref 65–99)

## 2017-02-28 NOTE — ED Notes (Signed)
Patient is up taking a shower,has flat affect, no signs of distress at this time noted, will continue to monitor, q 15 minute checks and camera surveillance in progress for safety.

## 2017-02-28 NOTE — ED Notes (Signed)
Patient has had good appetite, no complaints, did tell nurse that he was glad he was here because He felt like he might harm someone at times, He said that He wants to die and be with His sister that died 5 years ago, but does not have a plan, states ' I just want to get help until this passes," patient is calm on this unit, no behavioral issues noted, nurse will continue to monitor and gave report to oncoming nurse.

## 2017-02-28 NOTE — ED Notes (Signed)
Ivc/ placement pending / moved to bhu

## 2017-02-28 NOTE — ED Provider Notes (Signed)
-----------------------------------------   5:49 AM on 02/28/2017 -----------------------------------------   Blood pressure 117/86, pulse 68, temperature 97.7 F (36.5 C), temperature source Oral, resp. rate 16, height  (1.778 m), weight 86.2 kg (190 lb), SpO2 100 %.  The patient had no acute events since last update.  Calm and cooperative at this time.  Disposition is pending Psychiatry/Behavioral Medicine team recommendations.     Minna Antis, MD 02/28/17 [phone]

## 2017-02-28 NOTE — ED Notes (Signed)
Patient up to nursing station, states that he spilled his water, nurse Amy Rn assisted to get up spill, patient was polite and no signs of distress, q 15 minute checks and camera surveillance in progress for safety.

## 2017-02-28 NOTE — ED Notes (Signed)
Blood sugar obtained and it was 171, Patient received His 14 units of levimer, took all of His po medications,  and He did eat 100 % of breakfast. and beverage.

## 2017-02-28 NOTE — ED Notes (Signed)
Patient is lying in bed, nurse walked in and He opened eyes, and said He was fine, Nurse will continue to monitor and camera surveillance in progress for safety.

## 2017-02-28 NOTE — ED Notes (Signed)
Pt. Alert and oriented, warm and dry, in no distress. Pt. Denies SI, HI, and AVH. Pt states he is not SI but thinks about death a lot. Pt. Encouraged to let nursing staff know of any concerns or needs.

## 2017-02-28 NOTE — ED Notes (Signed)
Nurse gave patient his breakfast tray and He woke up, but did not eat and went back to sleep, Patient with order for levimer SQ, nurse will hold until He eats breakfast, patient without complaints, nurse will continue to monitor.

## 2017-03-01 DIAGNOSIS — F250 Schizoaffective disorder, bipolar type: Secondary | ICD-10-CM | POA: Diagnosis not present

## 2017-03-01 LAB — PROTIME-INR
INR: 1.06
Prothrombin Time: 13.7 seconds (ref 11.4–15.2)

## 2017-03-01 LAB — GLUCOSE, CAPILLARY: GLUCOSE-CAPILLARY: 140 mg/dL — AB (ref 65–99)

## 2017-03-01 MED ORDER — CLINDAMYCIN HCL 150 MG PO CAPS
300.0000 mg | ORAL_CAPSULE | Freq: Three times a day (TID) | ORAL | Status: DC
  Administered 2017-03-01 – 2017-03-02 (×4): 300 mg via ORAL
  Filled 2017-03-01 (×5): qty 2

## 2017-03-01 MED ORDER — NICOTINE 21 MG/24HR TD PT24
21.0000 mg | MEDICATED_PATCH | Freq: Every day | TRANSDERMAL | Status: DC
  Administered 2017-03-01: 21 mg via TRANSDERMAL
  Filled 2017-03-01 (×2): qty 1

## 2017-03-01 MED ORDER — METFORMIN HCL 500 MG PO TABS
ORAL_TABLET | ORAL | Status: AC
  Administered 2017-03-01: 500 mg
  Filled 2017-03-01: qty 1

## 2017-03-01 NOTE — ED Notes (Signed)
Lab drawing pt/inr without difficulty, Patient is calm and cooperative, safe, and no behavioral issues. q 15 minute checks and camera surveillance in progress for safety.

## 2017-03-01 NOTE — ED Notes (Signed)
Referral information for Psychiatric Hospitalization faxed to;      [facility] ([phone] or [phone]   [facility] ([phone]/[phone]),    [facility] ([phone]/F-[phone]),    [facility] ([phone]/F-[phone]),    [facility] ([phone]/F-[phone]).   

## 2017-03-01 NOTE — ED Notes (Signed)

## 2017-03-01 NOTE — ED Provider Notes (Signed)
-----------------------------------------   7:07 AM on 03/01/2017 -----------------------------------------   Blood pressure 124/80, pulse 74, temperature 97.6 F (36.4 C), temperature source Oral, resp. rate 17, height  (1.778 m), weight 86.2 kg (190 lb), SpO2 100 %.  The patient had no acute events since last update.  Calm and cooperative at this time.  Disposition is pending Psychiatry/Behavioral Medicine team recommendations.     Rebecka Apley, MD 03/01/17 [phone]

## 2017-03-01 NOTE — ED Notes (Signed)
Patient ate 100% of lunch with beverage, no signs of distress, q 15 minute checks and camera surveillance in progress for safety.

## 2017-03-01 NOTE — ED Notes (Signed)
Patient ate 100% of medications and had beverage, patient is calm and cooperative.

## 2017-03-01 NOTE — ED Notes (Signed)
Patient talking with Dr. Toni Amend , calm and cooperative. Will continue to monitor, q 15 minute checks and camera surveillance in progress for safety.

## 2017-03-01 NOTE — ED Notes (Signed)
Pt. Alert and oriented, warm and dry, in no distress. Pt. Denies SI, HI, and AVH. Pt. Encouraged to let nursing staff know of any concerns or needs. 

## 2017-03-01 NOTE — ED Notes (Signed)
PT  IVC  SEEN  BY  DR  CLAPACS  PENDING  PLACEMENT 

## 2017-03-01 NOTE — ED Notes (Signed)
Patient ate 100% of supper and beverage, Patient has been calm and cooperative, q 15 minute checks and camera surveillance in progress for safety.

## 2017-03-01 NOTE — ED Notes (Signed)
Patient up to bathroom, no signs of distress, and He went back to bed, states ' I just want to sleep" q 15 minute checks and camera surveillance in progress for safety.

## 2017-03-01 NOTE — Consult Note (Signed)
[facility] Face-to-Face Psychiatry Consult   Reason for Consult:  Consult for 41 year old man with long-standing schizophrenia and came to the hospital with complaints of suicidal thoughts. Referring Physician:  Mayford Knife Patient Identification: Lance [name] MRN:  [id number] Principal Diagnosis: Schizoaffective disorder, bipolar type [facility]) Diagnosis:   Patient Active Problem List   Diagnosis Date Noted  . Tobacco use disorder [F17.200] 09/01/2016  . Schizoaffective disorder, bipolar type (HCC) [F25.0] 05/22/2016  . Diabetes mellitus without complication (HCC) [E11.9] 05/21/2016  . Hyperlipidemia [E78.5] 05/21/2016  . History of pulmonary embolism [Z86.711] 05/21/2016  . Chronic anticoagulation [Z79.01] 05/21/2016  . Hypertension [I10] 09/25/2015    Total Time spent with patient: 1 hour  Subjective:   Lance [name] is a 41 y.o. male patient admitted with "I was getting really uncomfortable".  HPI:  Patient interviewed chart reviewed. Patient well known from prior encounters. Recently moved into a new living situation. Reports that over the last few days he has been feeling more uncomfortable there. He has been feeling like he was going to "lash out" at staff or other clients. He has constant thoughts about death and dying and is ruminating about wanting to be with his late sister. He has been compliant with his medicine. Not taking any drugs or alcohol. Sleeps poorly at night. Energy level poor. No specific intent to kill himself but frequently wishes that he were dead. More concerned that he is going to explode and go off on other people.  Medical history: Multiple significant medical problems including diabetes high blood pressure a history of pulmonary embolism requiring chronic anticoagulation  Substance abuse history: Patient denies any recent alcohol or drug abuse. Used to smoke a lot of pot but does not do that anymore.  Social history: Patient lives in a group home but recently moved to  a new one which sounds like it's been a pretty big stress for him.  Past Psychiatric History: Multiple prior hospitalizations. Diagnosis schizophrenia. Long-standing problems with social functioning and constant hallucinations despite use of clozapine. Positive past suicide attempts and violence especially when psychotic. Several prior hospitalizations.  Risk to Self: Suicidal Ideation: No Suicidal Intent: No Is patient at risk for suicide?: No Suicidal Plan?: No Access to Means: No What has been your use of drugs/alcohol within the last 12 months?: Pt denies use Other Self Harm Risks: none identified Triggers for Past Attempts: Hallucinations Intentional Self Injurious Behavior: None Risk to Others: Homicidal Ideation: Yes-Currently Present Thoughts of Harm to Others: Yes-Currently Present Comment - Thoughts of Harm to Others: pt reports AH telling him to hurt others Current Homicidal Intent: No Current Homicidal Plan: No Access to Homicidal Means: No Identified Victim: none identified History of harm to others?: No Assessment of Violence: None Noted Does patient have access to weapons?: No Criminal Charges Pending?: No Does patient have a court date: No Prior Inpatient Therapy: Prior Inpatient Therapy: Yes Prior Therapy Dates: 12/2016 Prior Therapy Facilty/Provider(s): [facility] Reason for Treatment: depression Prior Outpatient Therapy: Prior Outpatient Therapy: Yes Prior Therapy Dates: current Prior Therapy Facilty/Provider[facility]): [facility] Reason for Treatment: schizophrenia Does patient have an ACCT team?: Yes Does patient have Intensive In-House Services?  : No Does patient have [facility] services? : No Does patient have P4CC services?: No  Past Medical History:  Past Medical History:  Diagnosis Date  . Depression   . Diabetes mellitus without complication (HCC)   . Hyperlipidemia   . Hypertension   . Lupus anticoagulant disorder (HCC) 06/14/2012   as per Dr.pandit's note  in  dec 2013.  . PE (pulmonary thromboembolism) (HCC)   . Schizo affective schizophrenia (HCC)   . Supratherapeutic INR 11/19/2016   History reviewed. No pertinent surgical history. Family History:  Family History  Problem Relation Age of Onset  . CAD Mother   . CAD Sister    Family Psychiatric  History: Positive for psychosis Social History:  History  Alcohol Use No    Comment: occassionally     History  Drug Use  . Types: Marijuana    Comment: Last use 11/29/16    Social History   Social History  . Marital status: Single    Spouse name: N/A  . Number of children: N/A  . Years of education: N/A   Social History Main Topics  . Smoking status: Current Every Day Smoker    Packs/day: 1.00    Years: 23.00    Types: Cigarettes  . Smokeless tobacco: Never Used  . Alcohol use No     Comment: occassionally  . Drug use: Yes    Types: Marijuana     Comment: Last use 11/29/16  . Sexual activity: No     Comment: occasional marijuana- none recently   Other Topics Concern  . None   Social History Narrative   From a group home in [place]   Additional Social History:    Allergies:   Allergies  Allergen Reactions  . Penicillins Other (See Comments)    Reaction: "lockjaw" Has patient had a PCN reaction causing immediate rash, facial/tongue/throat swelling, SOB or lightheadedness with hypotension: No Has patient had a PCN reaction causing severe rash involving mucus membranes or skin necrosis: No Has patient had a PCN reaction that required hospitalization: No Has patient had a PCN reaction occurring within the last 10 years: No If all of the above answers are "NO", then may proceed with Cephalosporin use.     Labs:  Results for orders placed or performed during the hospital encounter of 02/26/17 (from the past 48 hour(s))  Protime-INR     Status: None   Collection Time: 02/28/17  5:35 AM  Result Value Ref Range   Prothrombin Time 13.2 11.4 - 15.2 seconds   INR 1.01    Glucose, capillary     Status: Abnormal   Collection Time: 02/28/17  9:19 AM  Result Value Ref Range   Glucose-Capillary 171 (H) 65 - 99 mg/dL  Protime-INR     Status: None   Collection Time: 03/01/17  7:30 AM  Result Value Ref Range   Prothrombin Time 13.7 11.4 - 15.2 seconds   INR 1.06   Glucose, capillary     Status: Abnormal   Collection Time: 03/01/17  8:46 AM  Result Value Ref Range   Glucose-Capillary 140 (H) 65 - 99 mg/dL    Current Facility-Administered Medications  Medication Dose Route Frequency Provider Last Rate Last Dose  . albuterol (PROVENTIL HFA;VENTOLIN HFA) 108 (90 Base) MCG/ACT inhaler 1-2 puff  1-2 puff Inhalation Q4H PRN Loleta Rose, MD      . buPROPion (WELLBUTRIN XL) 24 hr tablet 150 mg  150 mg Oral Daily Loleta Rose, MD   150 mg at 03/01/17 0849  . clindamycin (CLEOCIN) capsule 300 mg  300 mg Oral Q8H Nobie Alleyne T, MD   300 mg at 03/01/17 1641  . cloZAPine (CLOZARIL) tablet 500 mg  500 mg Oral QHS Loleta Rose, MD   500 mg at 02/28/17 2120  . docusate sodium (COLACE) capsule 200 mg  200 mg Oral  BID Loleta Rose, MD   200 mg at 03/01/17 0850  . ibuprofen (ADVIL,MOTRIN) tablet 600 mg  600 mg Oral Q8H PRN Loleta Rose, MD   600 mg at 03/01/17 1527  . insulin detemir (LEVEMIR) injection 14 Units  14 Units Subcutaneous Michail Jewels, MD   14 Units at 03/01/17 [phone]  . ipratropium (ATROVENT) 0.06 % nasal spray 1 spray  1 spray Each Nare QHS Loleta Rose, MD   1 spray at 02/28/17 2120  . lithium carbonate (ESKALITH) CR tablet 450 mg  450 mg Oral Q12H Loleta Rose, MD   450 mg at 03/01/17 0852  . LORazepam (ATIVAN) tablet 2 mg  2 mg Oral Q6H PRN Arnaldo Natal, MD   2 mg at 02/28/17 2120  . metFORMIN (GLUCOPHAGE-XR) 24 hr tablet 500 mg  500 mg Oral BID WC Loleta Rose, MD   500 mg at 03/01/17 0849  . mometasone-formoterol (DULERA) 200-5 MCG/ACT inhaler 2 puff  2 puff Inhalation BID Loleta Rose, MD   2 puff at 02/28/17 2121  . nicotine (NICODERM CQ  - dosed in mg/24 hours) patch 21 mg  21 mg Transdermal Daily Giancarlo Askren, [name], MD   21 mg at 03/01/17 1527  . polyethylene glycol (MIRALAX / GLYCOLAX) packet 17 g  17 g Oral Daily Loleta Rose, MD      . simvastatin (ZOCOR) tablet 40 mg  40 mg Oral QHS Loleta Rose, MD   40 mg at 02/28/17 2120  . warfarin (COUMADIN) tablet 5 mg  5 mg Oral q1800 Loleta Rose, MD   5 mg at 03/01/17 1642  . Warfarin - Physician Dosing Inpatient   Does not apply q1800 Loleta Rose, MD      . ziprasidone (GEODON) injection 20 mg  20 mg Intramuscular Q12H PRN Arnaldo Natal, MD       Current Outpatient Prescriptions  Medication Sig Dispense Refill  . albuterol (PROVENTIL HFA;VENTOLIN HFA) 108 (90 Base) MCG/ACT inhaler Inhale 1-2 puffs into the lungs every 4 (four) hours as needed for wheezing or shortness of breath. 1 Inhaler 0  . buPROPion (WELLBUTRIN XL) 150 MG 24 hr tablet Take 1 tablet (150 mg total) by mouth daily. 30 tablet 0  . cloZAPine (CLOZARIL) 100 MG tablet Take 5 tablets (500 mg total) by mouth at bedtime. 150 tablet 0  . docusate sodium (COLACE) 100 MG capsule Take 2 capsules (200 mg total) by mouth 2 (two) times daily. 120 capsule 0  . Fluticasone-Salmeterol (ADVAIR) 250-50 MCG/DOSE AEPB Inhale 1 puff into the lungs 2 (two) times daily.    . insulin detemir (LEVEMIR) 100 UNIT/ML injection Inject 14 Units into the skin every morning.    Marland Kitchen ipratropium (ATROVENT) 0.06 % nasal spray Place 1 spray into both nostrils at bedtime. 3 mL 0  . lisinopril (PRINIVIL,ZESTRIL) 2.5 MG tablet Take 2.5 mg by mouth daily.    Marland Kitchen lithium carbonate (ESKALITH) 450 MG CR tablet Take 1 tablet (450 mg total) by mouth every 12 (twelve) hours. 60 tablet 0  . metformin (FORTAMET) 500 MG (OSM) 24 hr tablet Take 500 mg by mouth 2 (two) times daily with a meal.    . mometasone-formoterol (DULERA) 200-5 MCG/ACT AERO Inhale 2 puffs into the lungs 2 (two) times daily. 1 Inhaler 1  . omega-3 acid ethyl esters (LOVAZA) 1 g capsule Take 1  g by mouth daily.    . polyethylene glycol (MIRALAX / GLYCOLAX) packet Take 17 g by mouth daily. 30 each 0  .  simvastatin (ZOCOR) 40 MG tablet Take 40 mg by mouth at bedtime.    . traZODone (DESYREL) 50 MG tablet Take 75 mg by mouth at bedtime.    Marland Kitchen warfarin (COUMADIN) 10 MG tablet Take 10 mg by mouth daily.    Marland Kitchen warfarin (COUMADIN) 5 MG tablet Take 1 tablet (5 mg total) by mouth daily at 6 PM. 30 tablet 0    Musculoskeletal: Strength & Muscle Tone: within normal limits Gait & Station: normal Patient leans: N/A  Psychiatric Specialty Exam: Physical Exam  Nursing note and vitals reviewed. Constitutional: He appears well-developed and well-nourished.  HENT:  Head: Normocephalic and atraumatic.  Eyes: Pupils are equal, round, and reactive to light. Conjunctivae are normal.  Neck: Normal range of motion.  Cardiovascular: Normal rate, regular rhythm and normal heart sounds.   Respiratory: Effort normal. No respiratory distress.  GI: Soft.  Musculoskeletal: Normal range of motion.  Neurological: He is alert.  Skin: Skin is warm and dry.  Psychiatric: His affect is blunt. His speech is delayed and tangential. He is slowed. Cognition and memory are impaired. He expresses impulsivity. He expresses suicidal ideation. He expresses suicidal plans.    Review of Systems  Constitutional: Negative.   HENT: Negative.   Eyes: Negative.   Respiratory: Negative.   Cardiovascular: Negative.   Gastrointestinal: Negative.   Musculoskeletal: Negative.   Skin: Negative.   Neurological: Negative.   Psychiatric/Behavioral: Positive for depression, hallucinations and suicidal ideas. Negative for memory loss and substance abuse. The patient is nervous/anxious and has insomnia.     Blood pressure 124/80, pulse 74, temperature 97.6 F (36.4 C), temperature source Oral, resp. rate 17, height  (1.778 m), weight 86.2 kg (190 lb), SpO2 100 %.Body mass index is 27.26 kg/m.  General Appearance:  Disheveled  Eye Contact:  Minimal  Speech:  Slow  Volume:  Decreased  Mood:  Depressed and Dysphoric  Affect:  Congruent and Flat  Thought Process:  Disorganized  Orientation:  Full (Time, Place, and Person)  Thought Content:  Rumination and Tangential  Suicidal Thoughts:  Yes.  without intent/plan  Homicidal Thoughts:  Yes.  without intent/plan  Memory:  Immediate;   Fair Recent;   Fair Remote;   Fair  Judgement:  Fair  Insight:  Fair  Psychomotor Activity:  Decreased  Concentration:  Concentration: Fair  Recall:  [employer] of Knowledge:  Fair  Language:  Fair  Akathisia:  No  Handed:  Right  AIMS (if indicated):     Assets:  Desire for Improvement Housing Resilience  ADL's:  Impaired  Cognition:  Impaired,  Mild  Sleep:        Treatment Plan Summary: Daily contact with patient to assess and evaluate symptoms and progress in treatment, Medication management and Plan 41 year old man with schizophrenia comes to the emergency room with homicidal and suicidal thoughts probably related to anxiety from a new living situation. Also rumination about death and dying. Patient has had these kind of problems of been getting worse recently as he feels more and more hopeless about his life. He has little social support as pretty much everyone in his family is deceased. Patient does not feel safe or comfortable with discharge. Continue usual outpatient medicine. Monitor blood sugars. Patient will be admitted to the psychiatric ward when space is available and followed up daily throughout his hospital stay for safety.  Disposition: Recommend psychiatric Inpatient admission when medically cleared. Supportive therapy provided about ongoing stressors.  Mordecai Rasmussen, MD 03/01/2017  4:51 PM

## 2017-03-02 ENCOUNTER — Encounter: Payer: Self-pay | Admitting: Psychiatry

## 2017-03-02 ENCOUNTER — Inpatient Hospital Stay
Admission: AD | Admit: 2017-03-02 | Discharge: 2017-03-12 | DRG: 885 | Disposition: A | Discharge status: Home / Self Care | Payer: Medicare Other | Attending: Psychiatry | Admitting: Psychiatry

## 2017-03-02 DIAGNOSIS — J449 Chronic obstructive pulmonary disease, unspecified: Secondary | ICD-10-CM | POA: Diagnosis present

## 2017-03-02 DIAGNOSIS — F419 Anxiety disorder, unspecified: Secondary | ICD-10-CM | POA: Diagnosis present

## 2017-03-02 DIAGNOSIS — K047 Periapical abscess without sinus: Secondary | ICD-10-CM | POA: Diagnosis present

## 2017-03-02 DIAGNOSIS — F259 Schizoaffective disorder, unspecified: Secondary | ICD-10-CM | POA: Diagnosis not present

## 2017-03-02 DIAGNOSIS — Z880 Allergy status to penicillin: Secondary | ICD-10-CM | POA: Diagnosis not present

## 2017-03-02 DIAGNOSIS — G4700 Insomnia, unspecified: Secondary | ICD-10-CM | POA: Diagnosis present

## 2017-03-02 DIAGNOSIS — Z79899 Other long term (current) drug therapy: Secondary | ICD-10-CM

## 2017-03-02 DIAGNOSIS — Z7984 Long term (current) use of oral hypoglycemic drugs: Secondary | ICD-10-CM | POA: Diagnosis not present

## 2017-03-02 DIAGNOSIS — Z01810 Encounter for preprocedural cardiovascular examination: Secondary | ICD-10-CM | POA: Diagnosis not present

## 2017-03-02 DIAGNOSIS — K5900 Constipation, unspecified: Secondary | ICD-10-CM | POA: Diagnosis present

## 2017-03-02 DIAGNOSIS — D6862 Lupus anticoagulant syndrome: Secondary | ICD-10-CM | POA: Diagnosis present

## 2017-03-02 DIAGNOSIS — E785 Hyperlipidemia, unspecified: Secondary | ICD-10-CM | POA: Diagnosis present

## 2017-03-02 DIAGNOSIS — F319 Bipolar disorder, unspecified: Secondary | ICD-10-CM | POA: Diagnosis present

## 2017-03-02 DIAGNOSIS — R45850 Homicidal ideations: Secondary | ICD-10-CM | POA: Diagnosis not present

## 2017-03-02 DIAGNOSIS — I10 Essential (primary) hypertension: Secondary | ICD-10-CM | POA: Diagnosis present

## 2017-03-02 DIAGNOSIS — F17210 Nicotine dependence, cigarettes, uncomplicated: Secondary | ICD-10-CM | POA: Diagnosis present

## 2017-03-02 DIAGNOSIS — F250 Schizoaffective disorder, bipolar type: Secondary | ICD-10-CM | POA: Diagnosis not present

## 2017-03-02 DIAGNOSIS — F29 Unspecified psychosis not due to a substance or known physiological condition: Secondary | ICD-10-CM | POA: Diagnosis present

## 2017-03-02 DIAGNOSIS — E119 Type 2 diabetes mellitus without complications: Secondary | ICD-10-CM | POA: Diagnosis present

## 2017-03-02 DIAGNOSIS — F17200 Nicotine dependence, unspecified, uncomplicated: Secondary | ICD-10-CM | POA: Diagnosis present

## 2017-03-02 DIAGNOSIS — Z86711 Personal history of pulmonary embolism: Secondary | ICD-10-CM | POA: Diagnosis not present

## 2017-03-02 DIAGNOSIS — Z7901 Long term (current) use of anticoagulants: Secondary | ICD-10-CM

## 2017-03-02 LAB — PROTIME-INR
INR: 1.18
PROTHROMBIN TIME: 14.9 s (ref 11.4–15.2)

## 2017-03-02 LAB — GLUCOSE, CAPILLARY: GLUCOSE-CAPILLARY: 118 mg/dL — AB (ref 65–99)

## 2017-03-02 MED ORDER — MOMETASONE FURO-FORMOTEROL FUM 200-5 MCG/ACT IN AERO
2.0000 | INHALATION_SPRAY | Freq: Two times a day (BID) | RESPIRATORY_TRACT | Status: DC
  Administered 2017-03-02 – 2017-03-12 (×21): 2 via RESPIRATORY_TRACT
  Filled 2017-03-02: qty 8.8

## 2017-03-02 MED ORDER — IPRATROPIUM BROMIDE 0.06 % NA SOLN
1.0000 | Freq: Every day | NASAL | Status: DC
  Administered 2017-03-03 – 2017-03-11 (×2): 1 via NASAL
  Filled 2017-03-02 (×2): qty 15

## 2017-03-02 MED ORDER — SIMVASTATIN 40 MG PO TABS
40.0000 mg | ORAL_TABLET | Freq: Every day | ORAL | Status: DC
Start: 2017-03-02 — End: 2017-03-12
  Administered 2017-03-02 – 2017-03-11 (×10): 40 mg via ORAL
  Filled 2017-03-02 (×10): qty 1

## 2017-03-02 MED ORDER — MAGNESIUM HYDROXIDE 400 MG/5ML PO SUSP: 30.0000 mL | Freq: Every day | ORAL | Status: DC | PRN

## 2017-03-02 MED ORDER — TRAZODONE HCL 100 MG PO TABS
100.0000 mg | ORAL_TABLET | Freq: Every day | ORAL | Status: DC
  Administered 2017-03-02 – 2017-03-10 (×9): 100 mg via ORAL
  Filled 2017-03-02 (×10): qty 1

## 2017-03-02 MED ORDER — CLOZAPINE 100 MG PO TABS
500.0000 mg | ORAL_TABLET | Freq: Every day | ORAL | Status: DC
  Administered 2017-03-02 – 2017-03-11 (×10): 500 mg via ORAL
  Filled 2017-03-02 (×10): qty 5

## 2017-03-02 MED ORDER — ACETAMINOPHEN 325 MG PO TABS: 650.0000 mg | ORAL_TABLET | Freq: Four times a day (QID) | ORAL | Status: DC | PRN

## 2017-03-02 MED ORDER — WARFARIN SODIUM 5 MG PO TABS
5.0000 mg | ORAL_TABLET | Freq: Every day | ORAL | Status: DC
  Administered 2017-03-02 – 2017-03-10 (×9): 5 mg via ORAL
  Filled 2017-03-02 (×9): qty 1

## 2017-03-02 MED ORDER — BUPROPION HCL ER (XL) 150 MG PO TB24: 150.0000 mg | ORAL_TABLET | Freq: Every day | ORAL | Status: DC

## 2017-03-02 MED ORDER — WARFARIN - PHYSICIAN DOSING INPATIENT
Freq: Every day | Status: DC
  Administered 2017-03-03 – 2017-03-11 (×8)

## 2017-03-02 MED ORDER — INSULIN DETEMIR 100 UNIT/ML ~~LOC~~ SOLN
14.0000 [IU] | SUBCUTANEOUS | Status: DC
  Administered 2017-03-03 – 2017-03-12 (×10): 14 [IU] via SUBCUTANEOUS
  Filled 2017-03-02 (×10): qty 0.14

## 2017-03-02 MED ORDER — ALUM & MAG HYDROXIDE-SIMETH 200-200-20 MG/5ML PO SUSP: 30.0000 mL | ORAL | Status: DC | PRN

## 2017-03-02 MED ORDER — ZIPRASIDONE MESYLATE 20 MG IM SOLR: 20.0000 mg | Freq: Two times a day (BID) | INTRAMUSCULAR | Status: DC | PRN

## 2017-03-02 MED ORDER — CLINDAMYCIN HCL 150 MG PO CAPS
300.0000 mg | ORAL_CAPSULE | Freq: Three times a day (TID) | ORAL | Status: AC
  Administered 2017-03-02 – 2017-03-05 (×8): 300 mg via ORAL
  Filled 2017-03-02 (×9): qty 2

## 2017-03-02 MED ORDER — LITHIUM CARBONATE ER 450 MG PO TBCR
450.0000 mg | EXTENDED_RELEASE_TABLET | Freq: Two times a day (BID) | ORAL | Status: DC
  Administered 2017-03-02 – 2017-03-12 (×20): 450 mg via ORAL
  Filled 2017-03-02 (×20): qty 1

## 2017-03-02 MED ORDER — BUPROPION HCL ER (XL) 150 MG PO TB24
150.0000 mg | ORAL_TABLET | Freq: Every day | ORAL | Status: DC
  Administered 2017-03-03 – 2017-03-12 (×10): 150 mg via ORAL
  Filled 2017-03-02 (×10): qty 1

## 2017-03-02 MED ORDER — IBUPROFEN 600 MG PO TABS
600.0000 mg | ORAL_TABLET | Freq: Three times a day (TID) | ORAL | Status: DC | PRN
  Administered 2017-03-08: 600 mg via ORAL
  Filled 2017-03-02: qty 1

## 2017-03-02 MED ORDER — POLYETHYLENE GLYCOL 3350 17 G PO PACK
17.0000 g | PACK | Freq: Every day | ORAL | Status: DC
Start: 2017-03-02 — End: 2017-03-12
  Administered 2017-03-03 – 2017-03-12 (×9): 17 g via ORAL
  Filled 2017-03-02 (×9): qty 1

## 2017-03-02 MED ORDER — NICOTINE 21 MG/24HR TD PT24
21.0000 mg | MEDICATED_PATCH | Freq: Every day | TRANSDERMAL | Status: DC
  Administered 2017-03-03: 21 mg via TRANSDERMAL
  Filled 2017-03-02 (×3): qty 1

## 2017-03-02 MED ORDER — LORAZEPAM 2 MG PO TABS: 2.0000 mg | ORAL_TABLET | Freq: Four times a day (QID) | ORAL | Status: DC | PRN

## 2017-03-02 MED ORDER — METFORMIN HCL ER 500 MG PO TB24
500.0000 mg | ORAL_TABLET | Freq: Two times a day (BID) | ORAL | Status: DC
  Administered 2017-03-02 – 2017-03-12 (×20): 500 mg via ORAL
  Filled 2017-03-02 (×21): qty 1

## 2017-03-02 MED ORDER — DIVALPROEX SODIUM 500 MG PO DR TAB
500.0000 mg | DELAYED_RELEASE_TABLET | Freq: Two times a day (BID) | ORAL | Status: DC
  Administered 2017-03-02 – 2017-03-09 (×14): 500 mg via ORAL
  Filled 2017-03-02 (×14): qty 1

## 2017-03-02 MED ORDER — DOCUSATE SODIUM 100 MG PO CAPS
200.0000 mg | ORAL_CAPSULE | Freq: Two times a day (BID) | ORAL | Status: DC
  Administered 2017-03-02 – 2017-03-12 (×20): 200 mg via ORAL
  Filled 2017-03-02 (×20): qty 2

## 2017-03-02 MED ORDER — ALBUTEROL SULFATE HFA 108 (90 BASE) MCG/ACT IN AERS
1.0000 | INHALATION_SPRAY | RESPIRATORY_TRACT | Status: DC | PRN
  Administered 2017-03-03 – 2017-03-09 (×2): 2 via RESPIRATORY_TRACT
  Filled 2017-03-02: qty 6.7

## 2017-03-02 NOTE — H&P (Signed)
Psychiatric Admission Assessment Adult  Patient Identification: Lance [name] MRN:  [id number] Date of Evaluation:  03/02/2017 Chief Complaint:  schizoaffective disorder Principal Diagnosis: Schizoaffective disorder, bipolar type (HCC) Diagnosis:   Patient Active Problem List   Diagnosis Date Noted  . Tobacco use disorder [F17.200] 09/01/2016  . Schizoaffective disorder, bipolar type (HCC) [F25.0] 05/22/2016  . Diabetes mellitus without complication (HCC) [E11.9] 05/21/2016  . Hyperlipidemia [E78.5] 05/21/2016  . History of pulmonary embolism [Z86.711] 05/21/2016  . Chronic anticoagulation [Z79.01] 05/21/2016  . Hypertension [I10] 09/25/2015   History of Present Illness:   Identifying data. Mr. Reigle is a 41 year old male with a history of schizoaffective disorder.  Chief complaint. "I had bad vibes from people."  History of present illness. Information was obtained from the patient and the chart. Mrs. Kelnhofer came to the emergency room complaining of increased of irritation, getting bad feelings about other people, and feeling that he is about to lash out. He did not acted out on these feelings but they are very ego-dystonic. The patient is not a violent person. He decided to come to the hospital. The patient denies any symptoms of depression, anxiety or psychosis. He has been stable of on these medications Clozaril, Wellbutrin, and lithium. This is confirmed by therapeutic lithium level. She does not use alcohol or illicit substances. The patient reports that recently she moved to a new group home. It is cleaner, newer, and nicer than the old one. The food is good. Patient was somewhat anxious about the location. He has his room there. Change of group homes was recommended by his act team as well as his guardian.  Psychiatry history. There is a long history of mental illness with multiple hospitalizations and medication trials. Several years ago the patient developed pulmonary embolism while  on Risperdal and has been maintained on wife ever since. He responded well to Clozaril. He is in the care of [name] act team.   Family psychiatric history. Unknown.  Social history. The patient is an incompetent adult and in [facility] is the guardian.  Total Time spent with patient: 1 hour  Is the patient at risk to self? No.  Has the patient been a risk to self in the past 6 months? No.  Has the patient been a risk to self within the distant past? Yes.    Is the patient a risk to others? No.  Has the patient been a risk to others in the past 6 months? No.  Has the patient been a risk to others within the distant past? No.   Prior Inpatient Therapy:   Prior Outpatient Therapy:    Alcohol Screening:   Substance Abuse History in the last 12 months:  No. Consequences of Substance Abuse: NA Previous Psychotropic Medications: Yes  Psychological Evaluations: No  Past Medical History:  Past Medical History:  Diagnosis Date  . Depression   . Diabetes mellitus without complication (HCC)   . Hyperlipidemia   . Hypertension   . Lupus anticoagulant disorder (HCC) 06/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  . PE (pulmonary thromboembolism) (HCC)   . Schizo affective schizophrenia (HCC)   . Supratherapeutic INR 11/19/2016   History reviewed. No pertinent surgical history. Family History:  Family History  Problem Relation Age of Onset  . CAD Mother   . CAD Sister     Tobacco Screening: Have you used any form of tobacco in the last 30 days? (Cigarettes, Smokeless Tobacco, Cigars, and/or Pipes): Yes  Tobacco use, Select all that apply: 5 or more cigarettes per day Are you interested in Tobacco Cessation Medications?: Yes, will notify MD for an order Counseled patient on smoking cessation including recognizing danger situations, developing coping skills and basic information about quitting provided: Yes Social History:  History  Alcohol Use No    Comment:  occassionally     History  Drug Use  . Types: Marijuana    Comment: Last use 11/29/16    Additional Social History:                           Allergies:   Allergies  Allergen Reactions  . Penicillins Other (See Comments)    Reaction: "lockjaw" Has patient had a PCN reaction causing immediate rash, facial/tongue/throat swelling, SOB or lightheadedness with hypotension: No Has patient had a PCN reaction causing severe rash involving mucus membranes or skin necrosis: No Has patient had a PCN reaction that required hospitalization: No Has patient had a PCN reaction occurring within the last 10 years: No If all of the above answers are "NO", then may proceed with Cephalosporin use.    Lab Results:  Results for orders placed or performed during the hospital encounter of 02/26/17 (from the past 48 hour(s))  Protime-INR     Status: None   Collection Time: 03/01/17  7:30 AM  Result Value Ref Range   Prothrombin Time 13.7 11.4 - 15.2 seconds   INR 1.06   Glucose, capillary     Status: Abnormal   Collection Time: 03/01/17  8:46 AM  Result Value Ref Range   Glucose-Capillary 140 (H) 65 - 99 mg/dL  Protime-INR     Status: None   Collection Time: 03/02/17  5:34 AM  Result Value Ref Range   Prothrombin Time 14.9 11.4 - 15.2 seconds   INR 1.18   Glucose, capillary     Status: Abnormal   Collection Time: 03/02/17  7:32 AM  Result Value Ref Range   Glucose-Capillary 118 (H) 65 - 99 mg/dL    Blood Alcohol level:  Lab Results  Component Value Date   ETH <5 02/26/2017   ETH <5 12/27/2016    Metabolic Disorder Labs:  Lab Results  Component Value Date   HGBA1C 5.5 12/30/2016   MPG 111 12/30/2016   MPG 114 12/02/2016   Lab Results  Component Value Date   PROLACTIN 2.0 (L) 08/31/2016   PROLACTIN 4.8 05/22/2016   Lab Results  Component Value Date   CHOL 160 12/30/2016   TRIG 318 (H) 12/30/2016   HDL 37 (L) 12/30/2016   CHOLHDL 4.3 12/30/2016   VLDL 64 (H)  12/30/2016   LDLCALC 59 12/30/2016   LDLCALC 60 12/02/2016    Current Medications: Current Facility-Administered Medications  Medication Dose Route Frequency Provider Last Rate Last Dose  . acetaminophen (TYLENOL) tablet 650 mg  650 mg Oral Q6H PRN Clapacs, [name], MD      . albuterol (PROVENTIL HFA;VENTOLIN HFA) 108 (90 Base) MCG/ACT inhaler 1-2 puff  1-2 puff Inhalation Q4H PRN Clapacs, [name], MD      . alum & mag hydroxide-simeth (MAALOX/MYLANTA) 200-200-20 MG/5ML suspension 30 mL  30 mL Oral Q4H PRN Clapacs, [name], MD      . Melene Muller ON 03/03/2017] buPROPion (WELLBUTRIN XL) 24 hr tablet 150 mg  150 mg Oral Daily Pollie Poma B, MD      . clindamycin (CLEOCIN) capsule 300 mg  300 mg Oral Q8H  Clapacs, [name], MD      . cloZAPine (CLOZARIL) tablet 500 mg  500 mg Oral QHS Clapacs, [name], MD      . divalproex (DEPAKOTE) DR tablet 500 mg  500 mg Oral Q12H Perl Kerney B, MD      . docusate sodium (COLACE) capsule 200 mg  200 mg Oral BID Clapacs, [name], MD      . ibuprofen (ADVIL,MOTRIN) tablet 600 mg  600 mg Oral Q8H PRN Clapacs, [name], MD      . Melene Muller ON 03/03/2017] insulin detemir (LEVEMIR) injection 14 Units  14 Units Subcutaneous [id number] Clapacs, [name], MD      . ipratropium (ATROVENT) 0.06 % nasal spray 1 spray  1 spray Each Nare QHS Clapacs, [name], MD      . lithium carbonate (ESKALITH) CR tablet 450 mg  450 mg Oral Q12H Clapacs, [name], MD      . LORazepam (ATIVAN) tablet 2 mg  2 mg Oral Q6H PRN Clapacs, [name], MD      . magnesium hydroxide (MILK OF MAGNESIA) suspension 30 mL  30 mL Oral Daily PRN Clapacs, [name], MD      . metFORMIN (GLUCOPHAGE-XR) 24 hr tablet 500 mg  500 mg Oral BID WC Clapacs, [name], MD      . mometasone-formoterol (DULERA) 200-5 MCG/ACT inhaler 2 puff  2 puff Inhalation BID Clapacs, [name], MD      . nicotine (NICODERM CQ - dosed in mg/24 hours) patch 21 mg  21 mg Transdermal Daily Clapacs, [name], MD      . polyethylene glycol (MIRALAX /  GLYCOLAX) packet 17 g  17 g Oral Daily Clapacs, [name], MD      . simvastatin (ZOCOR) tablet 40 mg  40 mg Oral QHS Clapacs, [name], MD      . traZODone (DESYREL) tablet 100 mg  100 mg Oral QHS Ethell Blatchford B, MD      . warfarin (COUMADIN) tablet 5 mg  5 mg Oral q1800 Clapacs, [name], MD      . Warfarin - Physician Dosing Inpatient   Does not apply [id number] Clapacs, [name], MD      . ziprasidone (GEODON) injection 20 mg  20 mg Intramuscular Q12H PRN Clapacs, [name], MD       PTA Medications: Prescriptions Prior to Admission  Medication Sig Dispense Refill Last Dose  . albuterol (PROVENTIL HFA;VENTOLIN HFA) 108 (90 Base) MCG/ACT inhaler Inhale 1-2 puffs into the lungs every 4 (four) hours as needed for wheezing or shortness of breath. 1 Inhaler 0 prn at prn  . buPROPion (WELLBUTRIN XL) 150 MG 24 hr tablet Take 1 tablet (150 mg total) by mouth daily. 30 tablet 0 unknown at unknown  . cloZAPine (CLOZARIL) 100 MG tablet Take 5 tablets (500 mg total) by mouth at bedtime. 150 tablet 0 unknown at unknown  . docusate sodium (COLACE) 100 MG capsule Take 2 capsules (200 mg total) by mouth 2 (two) times daily. 120 capsule 0 unknown at unknown  . Fluticasone-Salmeterol (ADVAIR) 250-50 MCG/DOSE AEPB Inhale 1 puff into the lungs 2 (two) times daily.   unknown at unknown  . insulin detemir (LEVEMIR) 100 UNIT/ML injection Inject 14 Units into the skin every morning.   unknown at unknown  . ipratropium (ATROVENT) 0.06 % nasal spray Place 1 spray into both nostrils at bedtime. 3 mL 0 unknown at unknown  . lisinopril (PRINIVIL,ZESTRIL) 2.5 MG tablet Take  2.5 mg by mouth daily.   unknown at unknown  . lithium carbonate (ESKALITH) 450 MG CR tablet Take 1 tablet (450 mg total) by mouth every 12 (twelve) hours. 60 tablet 0 unknown at unknown  . metformin (FORTAMET) 500 MG (OSM) 24 hr tablet Take 500 mg by mouth 2 (two) times daily with a meal.   unknown at unknown  . mometasone-formoterol (DULERA) 200-5 MCG/ACT AERO  Inhale 2 puffs into the lungs 2 (two) times daily. 1 Inhaler 1 unknown at unknown  . omega-3 acid ethyl esters (LOVAZA) 1 g capsule Take 1 g by mouth daily.   unknown at unknown  . polyethylene glycol (MIRALAX / GLYCOLAX) packet Take 17 g by mouth daily. 30 each 0 prn at prn  . simvastatin (ZOCOR) 40 MG tablet Take 40 mg by mouth at bedtime.   unknown at unknown  . traZODone (DESYREL) 50 MG tablet Take 75 mg by mouth at bedtime.   unknown at unknown  . warfarin (COUMADIN) 10 MG tablet Take 10 mg by mouth daily.   unknown at unknown  . warfarin (COUMADIN) 5 MG tablet Take 1 tablet (5 mg total) by mouth daily at 6 PM. 30 tablet 0 unknown at unknown    Musculoskeletal: Strength & Muscle Tone: within normal limits Gait & Station: normal Patient leans: N/A  Psychiatric Specialty Exam: Physical Exam  Nursing note and vitals reviewed. Constitutional: He is oriented to person, place, and time. He appears well-developed and well-nourished.  HENT:  Head: Normocephalic and atraumatic.  Eyes: Pupils are equal, round, and reactive to light. Conjunctivae and EOM are normal.  Neck: Normal range of motion. Neck supple.  Cardiovascular: Normal rate, regular rhythm and normal heart sounds.   Respiratory: Effort normal and breath sounds normal.  GI: Soft. Bowel sounds are normal.  Musculoskeletal: Normal range of motion.  Neurological: He is alert and oriented to person, place, and time.  Skin: Skin is warm and dry.  Psychiatric: His speech is normal. His mood appears anxious. His affect is blunt. He is withdrawn. Thought content is paranoid and delusional. Cognition and memory are normal. He expresses impulsivity.    Review of Systems  Psychiatric/Behavioral: The patient is nervous/anxious and has insomnia.   All other systems reviewed and are negative.   Blood pressure 129/77, pulse 87, temperature 98.3 F (36.8 C), temperature source Oral, resp. rate 16, height 5\' 10"  (1.778 m), weight 86.2 kg  (190 lb), SpO2 100 %.Body mass index is 27.26 kg/m.  See SRA.                                                  Sleep:       Treatment Plan Summary: Daily contact with patient to assess and evaluate symptoms and progress in treatment and Medication management   Mr. Bensinger is a 41 year old male with a history of schizoaffective disorder admitting for worsening of psychosis in spite of good treatment compliance.  1. Psychosis. We will continue clozapine 500 mg nightly for psychosis, Wellbutrin for depression, and lithium for mood stabilization. Lithium level is therapeutic. We will add low dose Depakote.  2. COPD. He is on inhalers.  3. Tooth abscess. He is on clindamycin.  4. Diabetes. He is on Lantus and metformin, ADA diet and blood glucose monitoring.  5. Dyslipidemia. He is on Zocor.  6. Constipation.  He is on bowel regimen.  7. History of pulmonary embolism. He is on Coumadin.  8. Insomnia. He is on trazodone.  9. Smoking. Nicotine patch is available.  10. Disposition. He will be discharged back to his group home. He will follow up with [facility] act team.   Observation Level/Precautions:  15 minute checks  Laboratory:  CBC Chemistry Profile UDS UA  Psychotherapy:    Medications:    Consultations:    Discharge Concerns:    Estimated LOS:  Other:     Physician Treatment Plan for Primary Diagnosis: Schizoaffective disorder, bipolar type (HCC) Long Term Goal(s): Improvement in symptoms so as ready for discharge  Short Term Goals: Ability to identify changes in lifestyle to reduce recurrence of condition will improve, Ability to verbalize feelings will improve, Ability to disclose and discuss suicidal ideas, Ability to demonstrate self-control will improve, Ability to identify and develop effective coping behaviors will improve and Ability to identify triggers associated with substance abuse/mental health issues will improve  Physician  Treatment Plan for Secondary Diagnosis: Principal Problem:   Schizoaffective disorder, bipolar type (HCC) Active Problems:   Hypertension   Diabetes mellitus without complication (HCC)   Hyperlipidemia   History of pulmonary embolism   Chronic anticoagulation   Tobacco use disorder  Long Term Goal(s): NA  Short Term Goals: NA  I certify that inpatient services furnished can reasonably be expected to improve the patient's condition.    Kristine Linea, MD 9/18/20184:43 PM

## 2017-03-02 NOTE — BH Assessment (Signed)
Pt pending [facility] BMU bed assignment. 

## 2017-03-02 NOTE — Consult Note (Signed)
[facility] Face-to-Face Psychiatry Consult   Reason for Consult:  Consult for 41 year old man with long-standing schizophrenia and came to the hospital with complaints of suicidal thoughts. Referring Physician:  Mayford Knife Patient Identification: Lance [name] MRN:  [id number] Principal Diagnosis: Schizoaffective disorder, bipolar type [facility]) Diagnosis:   Patient Active Problem List   Diagnosis Date Noted  . Tobacco use disorder [F17.200] 09/01/2016  . Schizoaffective disorder, bipolar type (HCC) [F25.0] 05/22/2016  . Diabetes mellitus without complication (HCC) [E11.9] 05/21/2016  . Hyperlipidemia [E78.5] 05/21/2016  . History of pulmonary embolism [Z86.711] 05/21/2016  . Chronic anticoagulation [Z79.01] 05/21/2016  . Hypertension [I10] 09/25/2015    Total Time spent with patient: 20 minutes  Subjective:   Lance [name] is a 41 y.o. male patient admitted with "I was getting really uncomfortable".  Follow-up for this 41 year old man with schizophrenia. Today is the 18th. Patient is staying withdrawn from others. He was awake when I spoke to him. Says he is still feeling sad and nervous and negative. Some passive suicidal thoughts. Confused and paranoid thinking. He is disheveled with poor hygiene. Eating adequately taking care of himself. Blood sugars are okay. His anticoagulation is perhaps a little lower than ideal and pharmacy I believe is managing that.  HPI:  Patient interviewed chart reviewed. Patient well known from prior encounters. Recently moved into a new living situation. Reports that over the last few days he has been feeling more uncomfortable there. He has been feeling like he was going to "lash out" at staff or other clients. He has constant thoughts about death and dying and is ruminating about wanting to be with his late sister. He has been compliant with his medicine. Not taking any drugs or alcohol. Sleeps poorly at night. Energy level poor. No specific intent to kill himself but  frequently wishes that he were dead. More concerned that he is going to explode and go off on other people.  Medical history: Multiple significant medical problems including diabetes high blood pressure a history of pulmonary embolism requiring chronic anticoagulation  Substance abuse history: Patient denies any recent alcohol or drug abuse. Used to smoke a lot of pot but does not do that anymore.  Social history: Patient lives in a group home but recently moved to a new one which sounds like it's been a pretty big stress for him.  Past Psychiatric History: Multiple prior hospitalizations. Diagnosis schizophrenia. Long-standing problems with social functioning and constant hallucinations despite use of clozapine. Positive past suicide attempts and violence especially when psychotic. Several prior hospitalizations.  Risk to Self: Suicidal Ideation: No Suicidal Intent: No Is patient at risk for suicide?: No Suicidal Plan?: No Access to Means: No What has been your use of drugs/alcohol within the last 12 months?: Pt denies use Other Self Harm Risks: none identified Triggers for Past Attempts: Hallucinations Intentional Self Injurious Behavior: None Risk to Others: Homicidal Ideation: Yes-Currently Present Thoughts of Harm to Others: Yes-Currently Present Comment - Thoughts of Harm to Others: pt reports AH telling him to hurt others Current Homicidal Intent: No Current Homicidal Plan: No Access to Homicidal Means: No Identified Victim: none identified History of harm to others?: No Assessment of Violence: None Noted Does patient have access to weapons?: No Criminal Charges Pending?: No Does patient have a court date: No Prior Inpatient Therapy: Prior Inpatient Therapy: Yes Prior Therapy Dates: 12/2016 Prior Therapy Facilty/Provider(s): [facility] Reason for Treatment: depression Prior Outpatient Therapy: Prior Outpatient Therapy: Yes Prior Therapy Dates: current Prior Therapy  Facilty/Provider(s): [facility]  Reason for Treatment: schizophrenia Does patient have an ACCT team?: Yes Does patient have Intensive In-House Services?  : No Does patient have [facility] services? : No Does patient have P4CC services?: No  Past Medical History:  Past Medical History:  Diagnosis Date  . Depression   . Diabetes mellitus without complication (HCC)   . Hyperlipidemia   . Hypertension   . Lupus anticoagulant disorder (HCC) 06/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  . PE (pulmonary thromboembolism) (HCC)   . Schizo affective schizophrenia (HCC)   . Supratherapeutic INR 11/19/2016   History reviewed. No pertinent surgical history. Family History:  Family History  Problem Relation Age of Onset  . CAD Mother   . CAD Sister    Family Psychiatric  History: Positive for psychosis Social History:  History  Alcohol Use No    Comment: occassionally     History  Drug Use  . Types: Marijuana    Comment: Last use 11/29/16    Social History   Social History  . Marital status: Single    Spouse name: N/A  . Number of children: N/A  . Years of education: N/A   Social History Main Topics  . Smoking status: Current Every Day Smoker    Packs/day: 1.00    Years: 23.00    Types: Cigarettes  . Smokeless tobacco: Never Used  . Alcohol use No     Comment: occassionally  . Drug use: Yes    Types: Marijuana     Comment: Last use 11/29/16  . Sexual activity: No     Comment: occasional marijuana- none recently   Other Topics Concern  . None   Social History Narrative   From a group home in [place]   Additional Social History:    Allergies:   Allergies  Allergen Reactions  . Penicillins Other (See Comments)    Reaction: "lockjaw" Has patient had a PCN reaction causing immediate rash, facial/tongue/throat swelling, SOB or lightheadedness with hypotension: No Has patient had a PCN reaction causing severe rash involving mucus membranes or skin necrosis:  No Has patient had a PCN reaction that required hospitalization: No Has patient had a PCN reaction occurring within the last 10 years: No If all of the above answers are "NO", then may proceed with Cephalosporin use.     Labs:  Results for orders placed or performed during the hospital encounter of 02/26/17 (from the past 48 hour(s))  Protime-INR     Status: None   Collection Time: 03/01/17  7:30 AM  Result Value Ref Range   Prothrombin Time 13.7 11.4 - 15.2 seconds   INR 1.06   Glucose, capillary     Status: Abnormal   Collection Time: 03/01/17  8:46 AM  Result Value Ref Range   Glucose-Capillary 140 (H) 65 - 99 mg/dL  Protime-INR     Status: None   Collection Time: 03/02/17  5:34 AM  Result Value Ref Range   Prothrombin Time 14.9 11.4 - 15.2 seconds   INR 1.18   Glucose, capillary     Status: Abnormal   Collection Time: 03/02/17  7:32 AM  Result Value Ref Range   Glucose-Capillary 118 (H) 65 - 99 mg/dL    Current Facility-Administered Medications  Medication Dose Route Frequency Provider Last Rate Last Dose  . albuterol (PROVENTIL HFA;VENTOLIN HFA) 108 (90 Base) MCG/ACT inhaler 1-2 puff  1-2 puff Inhalation Q4H PRN Loleta Rose, MD      . buPROPion (WELLBUTRIN XL) 24  hr tablet 150 mg  150 mg Oral Daily Loleta Rose, MD   150 mg at 03/02/17 1610  . clindamycin (CLEOCIN) capsule 300 mg  300 mg Oral Q8H Nikaya Nasby T, MD   300 mg at 03/02/17 9604  . cloZAPine (CLOZARIL) tablet 500 mg  500 mg Oral QHS Loleta Rose, MD   500 mg at 03/01/17 2148  . docusate sodium (COLACE) capsule 200 mg  200 mg Oral BID Loleta Rose, MD   200 mg at 03/02/17 5409  . ibuprofen (ADVIL,MOTRIN) tablet 600 mg  600 mg Oral Q8H PRN Loleta Rose, MD   600 mg at 03/01/17 1527  . insulin detemir (LEVEMIR) injection 14 Units  14 Units Subcutaneous Michail Jewels, MD   14 Units at 03/02/17 [phone]  . ipratropium (ATROVENT) 0.06 % nasal spray 1 spray  1 spray Each Nare QHS Loleta Rose, MD   1 spray  at 03/01/17 2148  . lithium carbonate (ESKALITH) CR tablet 450 mg  450 mg Oral Q12H Loleta Rose, MD   450 mg at 03/02/17 0939  . LORazepam (ATIVAN) tablet 2 mg  2 mg Oral Q6H PRN Arnaldo Natal, MD   2 mg at 03/01/17 2148  . metFORMIN (GLUCOPHAGE-XR) 24 hr tablet 500 mg  500 mg Oral BID WC Loleta Rose, MD   500 mg at 03/02/17 0939  . mometasone-formoterol (DULERA) 200-5 MCG/ACT inhaler 2 puff  2 puff Inhalation BID Loleta Rose, MD   2 puff at 03/02/17 0944  . nicotine (NICODERM CQ - dosed in mg/24 hours) patch 21 mg  21 mg Transdermal Daily Tyjah Hai, [name], MD   21 mg at 03/01/17 1527  . polyethylene glycol (MIRALAX / GLYCOLAX) packet 17 g  17 g Oral Daily Loleta Rose, MD      . simvastatin (ZOCOR) tablet 40 mg  40 mg Oral QHS Loleta Rose, MD   40 mg at 03/01/17 2148  . warfarin (COUMADIN) tablet 5 mg  5 mg Oral q1800 Loleta Rose, MD   5 mg at 03/01/17 1642  . Warfarin - Physician Dosing Inpatient   Does not apply q1800 Loleta Rose, MD      . ziprasidone (GEODON) injection 20 mg  20 mg Intramuscular Q12H PRN Arnaldo Natal, MD       Current Outpatient Prescriptions  Medication Sig Dispense Refill  . albuterol (PROVENTIL HFA;VENTOLIN HFA) 108 (90 Base) MCG/ACT inhaler Inhale 1-2 puffs into the lungs every 4 (four) hours as needed for wheezing or shortness of breath. 1 Inhaler 0  . buPROPion (WELLBUTRIN XL) 150 MG 24 hr tablet Take 1 tablet (150 mg total) by mouth daily. 30 tablet 0  . cloZAPine (CLOZARIL) 100 MG tablet Take 5 tablets (500 mg total) by mouth at bedtime. 150 tablet 0  . docusate sodium (COLACE) 100 MG capsule Take 2 capsules (200 mg total) by mouth 2 (two) times daily. 120 capsule 0  . Fluticasone-Salmeterol (ADVAIR) 250-50 MCG/DOSE AEPB Inhale 1 puff into the lungs 2 (two) times daily.    . insulin detemir (LEVEMIR) 100 UNIT/ML injection Inject 14 Units into the skin every morning.    Marland Kitchen ipratropium (ATROVENT) 0.06 % nasal spray Place 1 spray into both nostrils at  bedtime. 3 mL 0  . lisinopril (PRINIVIL,ZESTRIL) 2.5 MG tablet Take 2.5 mg by mouth daily.    Marland Kitchen lithium carbonate (ESKALITH) 450 MG CR tablet Take 1 tablet (450 mg total) by mouth every 12 (twelve) hours. 60 tablet 0  . metformin (FORTAMET)  500 MG (OSM) 24 hr tablet Take 500 mg by mouth 2 (two) times daily with a meal.    . mometasone-formoterol (DULERA) 200-5 MCG/ACT AERO Inhale 2 puffs into the lungs 2 (two) times daily. 1 Inhaler 1  . omega-3 acid ethyl esters (LOVAZA) 1 g capsule Take 1 g by mouth daily.    . polyethylene glycol (MIRALAX / GLYCOLAX) packet Take 17 g by mouth daily. 30 each 0  . simvastatin (ZOCOR) 40 MG tablet Take 40 mg by mouth at bedtime.    . traZODone (DESYREL) 50 MG tablet Take 75 mg by mouth at bedtime.    Marland Kitchen warfarin (COUMADIN) 10 MG tablet Take 10 mg by mouth daily.    Marland Kitchen warfarin (COUMADIN) 5 MG tablet Take 1 tablet (5 mg total) by mouth daily at 6 PM. 30 tablet 0    Musculoskeletal: Strength & Muscle Tone: within normal limits Gait & Station: normal Patient leans: N/A  Psychiatric Specialty Exam: Physical Exam  Nursing note and vitals reviewed. Constitutional: He appears well-developed and well-nourished.  HENT:  Head: Normocephalic and atraumatic.  Eyes: Pupils are equal, round, and reactive to light. Conjunctivae are normal.  Neck: Normal range of motion.  Cardiovascular: Normal rate, regular rhythm and normal heart sounds.   Respiratory: Effort normal. No respiratory distress.  GI: Soft.  Musculoskeletal: Normal range of motion.  Neurological: He is alert.  Skin: Skin is warm and dry.  Psychiatric: His affect is blunt. His speech is delayed and tangential. He is slowed. Cognition and memory are impaired. He expresses impulsivity. He expresses suicidal ideation. He expresses suicidal plans.    Review of Systems  Constitutional: Negative.   HENT: Negative.   Eyes: Negative.   Respiratory: Negative.   Cardiovascular: Negative.   Gastrointestinal:  Negative.   Musculoskeletal: Negative.   Skin: Negative.   Neurological: Negative.   Psychiatric/Behavioral: Positive for depression, hallucinations and suicidal ideas. Negative for memory loss and substance abuse. The patient is nervous/anxious and has insomnia.     Blood pressure 105/72, pulse 72, temperature (!) 97.3 F (36.3 C), temperature source Oral, resp. rate 18, height  (1.778 m), weight 86.2 kg (190 lb), SpO2 100 %.Body mass index is 27.26 kg/m.  General Appearance: Disheveled  Eye Contact:  Minimal  Speech:  Slow  Volume:  Decreased  Mood:  Depressed and Dysphoric  Affect:  Congruent and Flat  Thought Process:  Disorganized  Orientation:  Full (Time, Place, and Person)  Thought Content:  Rumination and Tangential  Suicidal Thoughts:  Yes.  without intent/plan  Homicidal Thoughts:  Yes.  without intent/plan  Memory:  Immediate;   Fair Recent;   Fair Remote;   Fair  Judgement:  Fair  Insight:  Fair  Psychomotor Activity:  Decreased  Concentration:  Concentration: Fair  Recall:  [employer] of Knowledge:  Fair  Language:  Fair  Akathisia:  No  Handed:  Right  AIMS (if indicated):     Assets:  Desire for Improvement Housing Resilience  ADL's:  Impaired  Cognition:  Impaired,  Mild  Sleep:        Treatment Plan Summary: Daily contact with patient to assess and evaluate symptoms and progress in treatment, Medication management and Plan Patient awaiting admission to psychiatry ward. Supportive counseling and review of plan. No change to medication. Blood sugars seem to be reasonably well controlled on current diet as long as he is compliant. No change to psychiatric medicine today. He should be moving downstairs fairly  set in.  Disposition: Recommend psychiatric Inpatient admission when medically cleared. Supportive therapy provided about ongoing stressors.  Mordecai Rasmussen, MD 03/02/2017 12:20 PM

## 2017-03-02 NOTE — ED Notes (Signed)
Patient asleep in room. No noted distress or abnormal behavior. Will continue 15 minute checks and observation by security cameras for safety. 

## 2017-03-02 NOTE — BH Assessment (Signed)
Patient is to be admitted to [facility] by Dr. Toni Amend.  Attending Physician will be Dr. Jennet Maduro.   Patient has been assigned to room 305A, by [facility] Charge Nurse [place].   ER staff is aware of the admission Rivka Barbara, ER Sect.; Dr. Scotty Court, ER MD; Amy Patient's Nurse & Gerilyn Pilgrim, Patient Access).

## 2017-03-02 NOTE — ED Notes (Signed)
Pt taking a shower 

## 2017-03-02 NOTE — BHH Suicide Risk Assessment (Signed)
[facility] Admission Suicide Risk Assessment   Nursing information obtained from:    Demographic factors:    Current Mental Status:    Loss Factors:    Historical Factors:    Risk Reduction Factors:     Total Time spent with patient: 1 hour Principal Problem: Schizoaffective disorder, bipolar type (HCC) Diagnosis:   Patient Active Problem List   Diagnosis Date Noted  . Tobacco use disorder [F17.200] 09/01/2016  . Schizoaffective disorder, bipolar type (HCC) [F25.0] 05/22/2016  . Diabetes mellitus without complication (HCC) [E11.9] 05/21/2016  . Hyperlipidemia [E78.5] 05/21/2016  . History of pulmonary embolism [Z86.711] 05/21/2016  . Chronic anticoagulation [Z79.01] 05/21/2016  . Hypertension [I10] 09/25/2015   Subjective Data: psychotic break.  Continued Clinical Symptoms:    The "Alcohol Use Disorders Identification Test", Guidelines for Use in Primary Care, Second Edition.  World Science writer [facility]). Score between 0-7:  no or low risk or alcohol related problems. Score between 8-15:  moderate risk of alcohol related problems. Score between 16-19:  high risk of alcohol related problems. Score 20 or above:  warrants further diagnostic evaluation for alcohol dependence and treatment.   CLINICAL FACTORS:   Schizophrenia:   Depressive state   Musculoskeletal: Strength & Muscle Tone: within normal limits Gait & Station: normal Patient leans: N/A  Psychiatric Specialty Exam: Physical Exam  Nursing note and vitals reviewed. Psychiatric: Lance [name] speech is normal. Lance [name] mood appears anxious. Lance [name] affect is blunt. He is withdrawn. Thought content is paranoid and delusional. Cognition and memory are normal. He expresses impulsivity.    Review of Systems  Psychiatric/Behavioral: The patient is nervous/anxious and has insomnia.   All other systems reviewed and are negative.   Blood pressure 129/77, pulse 87, temperature 98.3 F (36.8 C), temperature source Oral, resp. rate 16,  height  (1.778 m), weight 86.2 kg (190 lb), SpO2 100 %.Body mass index is 27.26 kg/m.  General Appearance: Fairly Groomed  Eye Contact:  Good  Speech:  Clear and Coherent  Volume:  Decreased  Mood:  Anxious and Depressed  Affect:  Blunt  Thought Process:  Goal Directed and Descriptions of Associations: Intact  Orientation:  Full (Time, Place, and Person)  Thought Content:  Delusions and Paranoid Ideation  Suicidal Thoughts:  No  Homicidal Thoughts:  No  Memory:  Immediate;   Fair Recent;   Fair Remote;   Fair  Judgement:  Fair  Insight:  Fair  Psychomotor Activity:  Psychomotor Retardation  Concentration:  Concentration: Fair and Attention Span: Fair  Recall:  [employer] of Knowledge:  Fair  Language:  Fair  Akathisia:  No  Handed:  Right  AIMS (if indicated):     Assets:  Communication Skills Desire for Improvement Financial Resources/Insurance Housing Physical Health Resilience Social Support  ADL's:  Intact  Cognition:  WNL  Sleep:         COGNITIVE FEATURES THAT CONTRIBUTE TO RISK:  None    SUICIDE RISK:   Moderate:  Frequent suicidal ideation with limited intensity, and duration, some specificity in terms of plans, no associated intent, good self-control, limited dysphoria/symptomatology, some risk factors present, and identifiable protective factors, including available and accessible social support.  PLAN OF CARE: Hospital admission, medication management, discharge planning.  Lance [name] is a 41 year old male with a history of schizoaffective disorder admitting for worsening of psychosis in spite of good treatment compliance.  1. Psychosis. We will continue clozapine 500 mg nightly for psychosis, Wellbutrin for depression, and lithium for mood stabilization.  Lithium level is therapeutic. We will add low dose Depakote.  2. COPD. He is on inhalers.  3. Tooth abscess. He is on clindamycin.  4. Diabetes. He is on Lantus and metformin, ADA diet and blood  glucose monitoring.  5. Dyslipidemia. He is on Zocor.  6. Constipation. He is on bowel regimen.  7. History of pulmonary embolism. He is on Coumadin.  8. Insomnia. He is on trazodone.  9. Smoking. Nicotine patch is available.  10. Disposition. He will be discharged back to [name] group home. He will follow up with [facility] act team.    I certify that inpatient services furnished can reasonably be expected to improve the patient's condition.   Kristine Linea, MD 03/02/2017, 4:31 PM
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
## 2017-03-02 NOTE — ED Notes (Signed)
Breakfast tray placed in patient's room. Pt continues to sleep. Maintained on 15 minute checks and observation by security camera for safety.

## 2017-03-02 NOTE — ED Notes (Signed)
Pt accepting of transfer to BMU. Pt stated, "I hope they can help me. I've been having violent urges."  Pt has been cooperative while in [place]. Maintained on 15 minute checks and observation by security camera for safety.

## 2017-03-03 ENCOUNTER — Other Ambulatory Visit: Payer: Self-pay

## 2017-03-03 LAB — LIPID PANEL
Cholesterol: 131 mg/dL (ref 0–200)
HDL: 31 mg/dL — ABNORMAL LOW (ref >40)
LDL Cholesterol: 59 mg/dL (ref 0–99)
Total CHOL/HDL Ratio: 4.2 RATIO
Triglycerides: 207 mg/dL — ABNORMAL HIGH (ref <150)
VLDL: 41 mg/dL — AB (ref 0–40)

## 2017-03-03 LAB — GLUCOSE, CAPILLARY
Glucose-Capillary: 94 mg/dL (ref 65–99)
Glucose-Capillary: 119 mg/dL — ABNORMAL HIGH (ref 65–99)
Glucose-Capillary: 204 mg/dL — ABNORMAL HIGH (ref 65–99)

## 2017-03-03 LAB — TSH: TSH: 2.766 u[IU]/mL (ref 0.350–4.500)

## 2017-03-03 LAB — PROTIME-INR
INR: 1.27
Prothrombin Time: 15.8 seconds — ABNORMAL HIGH (ref 11.4–15.2)

## 2017-03-03 LAB — HEMOGLOBIN A1C
Hgb A1c MFr Bld: 5.3 % (ref 4.8–5.6)
Mean Plasma Glucose: 105.41 mg/dL

## 2017-03-03 NOTE — BHH Counselor (Addendum)
CSW spoke w [name], patient's guardian.  Aware that patient has admitted, states plan is for patient to return to current group home ([facility] in [place]) and follow up w [name] ACT Team.  Guardianship paperwork in Media tab. Spoke w ACT team who states that they will staff patient's case and visit on unit, time TBD.  Santa Genera, LCSW Lead Clinical Social Worker Phone:  [phone]

## 2017-03-03 NOTE — Plan of Care (Signed)
Problem: Activity: Goal: Sleeping patterns will improve Outcome: Progressing Patient slept for Estimated Hours of 7.30; Precautionary checks every 15 minutes for safety maintained, room free of safety hazards, patient sustains no injury or falls during this shift.    

## 2017-03-03 NOTE — Progress Notes (Signed)
[facility] MD Progress Note  03/03/2017 8:55 AM Lance [name]  MRN:  [id number]  Subjective:   03/03/2017. Lance [name] met with treatment team today. He still feels on the edge and his "head is not straight". He denies thoughts of hurting himself. He denies intention to hurt others but feels uneasy around people. There is no history of violence. Lance [name] has been out of his room today somewhat restless walking around the unit. He slept well with Trazodone. He accepts medications and tolerates them well. He reports no side effects from Depakote we started yesterday. Due to the history of PE we do not want to introduce another antipsychotic. We are checking Clozapine level. His hygiene is poor. This is uncharacteristic.   Per nursing: Grumpy, disheveled, pacing thoughtfully, distended abdomen, pleasant on approach, "I can't be around people, can I get my medication late." Minimal interaction with peers except for during snacks; denied SI/HI, appeared to be responding to internal stimuli but denied it.  Principal Problem: Schizoaffective disorder, bipolar type (HCC) Diagnosis:   Patient Active Problem List   Diagnosis Date Noted  . Tobacco use disorder [F17.200] 09/01/2016  . Schizoaffective disorder, bipolar type (HCC) [F25.0] 05/22/2016  . Diabetes mellitus without complication (HCC) [E11.9] 05/21/2016  . Hyperlipidemia [E78.5] 05/21/2016  . History of pulmonary embolism [Z86.711] 05/21/2016  . Chronic anticoagulation [Z79.01] 05/21/2016  . Hypertension [I10] 09/25/2015   Total Time spent with patient: 30 minutes  Past Psychiatric History: Long history of schizophrenia or schizoaffective disorder with multiple hospitalizations and medication trials and a history of PE on Risperdal. He is more stable on Clozapine. He is in the care of [facility] team.   Past Medical History:  Past Medical History:  Diagnosis Date  . Depression   . Diabetes mellitus without complication (HCC)   . Hyperlipidemia    . Hypertension   . Lupus anticoagulant disorder (HCC) 06/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  . PE (pulmonary thromboembolism) (HCC)   . Schizo affective schizophrenia (HCC)   . Supratherapeutic INR 11/19/2016   History reviewed. No pertinent surgical history. Family History:  Family History  Problem Relation Age of Onset  . CAD Mother   . CAD Sister    Family Psychiatric  History: see H&P. Social History:  History  Alcohol Use No    Comment: occassionally     History  Drug Use  . Types: Marijuana    Comment: Last use 11/29/16    Social History   Social History  . Marital status: Single    Spouse name: N/A  . Number of children: N/A  . Years of education: N/A   Social History Main Topics  . Smoking status: Current Every Day Smoker    Packs/day: 1.00    Years: 23.00    Types: Cigarettes  . Smokeless tobacco: Never Used  . Alcohol use No     Comment: occassionally  . Drug use: Yes    Types: Marijuana     Comment: Last use 11/29/16  . Sexual activity: No     Comment: occasional marijuana- none recently   Other Topics Concern  . None   Social History Narrative   From a group home in [place]   Additional Social History: The patient is an incompetent adult and [place] [facility] is the guardian. He just relocated to a new group home a week or so ago. He may return there.                           Sleep: Fair  Appetite:  Fair  Current Medications: Current Facility-Administered Medications  Medication Dose Route Frequency Provider Last Rate Last Dose  . acetaminophen (TYLENOL) tablet 650 mg  650 mg Oral Q6H PRN Clapacs, [name], MD      . albuterol (PROVENTIL HFA;VENTOLIN HFA) 108 (90 Base) MCG/ACT inhaler 1-2 puff  1-2 puff Inhalation Q4H PRN Clapacs, [name], MD      . alum & mag hydroxide-simeth (MAALOX/MYLANTA) 200-200-20 MG/5ML suspension 30 mL  30 mL Oral Q4H PRN Clapacs, [name], MD      . buPROPion (WELLBUTRIN XL) 24 hr tablet 150 mg  150 mg  Oral Daily ,  B, MD      . clindamycin (CLEOCIN) capsule 300 mg  300 mg Oral Q8H Clapacs, [name], MD   300 mg at 03/02/17 2153  . cloZAPine (CLOZARIL) tablet 500 mg  500 mg Oral QHS Clapacs, [name], MD   500 mg at 03/02/17 2103  . divalproex (DEPAKOTE) DR tablet 500 mg  500 mg Oral Q12H ,  B, MD   500 mg at 03/02/17 2000  . docusate sodium (COLACE) capsule 200 mg  200 mg Oral BID Clapacs, [name], MD   200 mg at 03/02/17 1746  . ibuprofen (ADVIL,MOTRIN) tablet 600 mg  600 mg Oral Q8H PRN Clapacs, [name], MD      . insulin detemir (LEVEMIR) injection 14 Units  14 Units Subcutaneous BH-[id number] Clapacs, [name], MD      . ipratropium (ATROVENT) 0.06 % nasal spray 1 spray  1 spray Each Nare QHS Clapacs, [name], MD      . lithium carbonate (ESKALITH) CR tablet 450 mg  450 mg Oral Q12H Clapacs, [name], MD   450 mg at 03/02/17 2000  . LORazepam (ATIVAN) tablet 2 mg  2 mg Oral Q6H PRN Clapacs, [name], MD      . magnesium hydroxide (MILK OF MAGNESIA) suspension 30 mL  30 mL Oral Daily PRN Clapacs, [name], MD      . metFORMIN (GLUCOPHAGE-XR) 24 hr tablet 500 mg  500 mg Oral BID WC Clapacs, [name], MD   500 mg at 03/02/17 1746  . mometasone-formoterol (DULERA) 200-5 MCG/ACT inhaler 2 puff  2 puff Inhalation BID Clapacs, [name], MD   2 puff at 03/02/17 1747  . nicotine (NICODERM CQ - dosed in mg/24 hours) patch 21 mg  21 mg Transdermal Daily Clapacs, [name], MD      . polyethylene glycol (MIRALAX / GLYCOLAX) packet 17 g  17 g Oral Daily Clapacs, [name], MD      . simvastatin (ZOCOR) tablet 40 mg  40 mg Oral QHS Clapacs, [name], MD   40 mg at 03/02/17 2103  . traZODone (DESYREL) tablet 100 mg  100 mg Oral QHS ,  B, MD   100 mg at 03/02/17 2103  . warfarin (COUMADIN) tablet 5 mg  5 mg Oral q1800 Clapacs, [name], MD   5 mg at 03/02/17 1746  . Warfarin - Physician Dosing Inpatient   Does not apply q1800 Clapacs, [name], MD      . ziprasidone (GEODON) injection 20 mg   20 mg Intramuscular Q12H PRN Clapacs, [name], MD        Lab Results:  Results for orders placed or performed during the hospital encounter of 03/02/17 (from the past 48 hour(s))  Protime-INR     Status: Abnormal   Collection Time: 03/03/17  7:18   AM  Result Value Ref Range   Prothrombin Time 15.8 (H) 11.4 - 15.2 seconds   INR 1.27     Blood Alcohol level:  Lab Results  Component Value Date   ETH <5 02/26/2017   ETH <5 01/60/1093    Metabolic Disorder Labs: Lab Results  Component Value Date   HGBA1C 5.5 12/30/2016   MPG 111 12/30/2016   MPG 114 12/02/2016   Lab Results  Component Value Date   PROLACTIN 2.0 (L) 08/31/2016   PROLACTIN 4.8 05/22/2016   Lab Results  Component Value Date   CHOL 160 12/30/2016   TRIG 318 (H) 12/30/2016   HDL 37 (L) 12/30/2016   CHOLHDL 4.3 12/30/2016   VLDL 64 (H) 12/30/2016   LDLCALC 59 12/30/2016   LDLCALC 60 12/02/2016    Physical Findings: AIMS:  , ,  ,  ,    CIWA:    COWS:     Musculoskeletal: Strength & Muscle Tone: within normal limits Gait & Station: normal Patient leans: N/A  Psychiatric Specialty Exam: Physical Exam  Nursing note and vitals reviewed. Psychiatric: His speech is normal. His mood appears anxious. His affect is blunt. He is hyperactive. Thought content is paranoid and delusional. Cognition and memory are normal. He expresses impulsivity.    Review of Systems  Constitutional: Negative.   HENT: Negative.   Eyes: Negative.   Respiratory: Negative.   Cardiovascular: Negative.   Gastrointestinal: Negative.   Genitourinary: Negative.   Musculoskeletal: Negative.   Skin: Negative.   Neurological: Negative.   Endo/Heme/Allergies: Negative.   Psychiatric/Behavioral: The patient is nervous/anxious and has insomnia.     Blood pressure (!) 115/98, pulse 78, temperature 98 F (36.7 C), temperature source Oral, resp. rate 18, height 5' 10" (1.778 m), weight 86.2 kg (190 lb), SpO2 100 %.Body mass index is 27.26  kg/m.  General Appearance: Disheveled  Eye Contact:  Good  Speech:  Normal Rate  Volume:  Normal  Mood:  Anxious  Affect:  Congruent  Thought Process:  Goal Directed and Descriptions of Associations: Intact  Orientation:  Full (Time, Place, and Person)  Thought Content:  Delusions and Paranoid Ideation  Suicidal Thoughts:  No  Homicidal Thoughts:  No  Memory:  Immediate;   Fair Recent;   Fair Remote;   Fair  Judgement:  Fair  Insight:  Present  Psychomotor Activity:  Normal  Concentration:  Concentration: Fair and Attention Span: Fair  Recall:  [employer] of Knowledge:  Fair  Language:  Fair  Akathisia:  No  Handed:  Right  AIMS (if indicated):     Assets:  Communication Skills Desire for Improvement Financial Resources/Insurance Housing Resilience Social Support  ADL's:  Intact  Cognition:  WNL  Sleep:  Number of Hours: 7.3     Treatment Plan Summary: Daily contact with patient to assess and evaluate symptoms and progress in treatment and Medication management   Mr. Bernabei is a 41 year old male with a history of schizoaffective disorder admitting for worsening of psychosis in spite of good treatment compliance.  1. Psychosis. We continue clozapine 500 mg nightly for psychosis, Wellbutrin for depression, and lithium for mood stabilization. Lithium level was therapeutic on admission. We added low dose Depakote for further mood stabilization. We will check VPA level before discharge. Clozapine level pending.   2. COPD. He is on inhalers.  3. Tooth abscess. He is on clindamycin.  4. Diabetes. He is on Lantus and metformin, ADA diet and blood glucose monitoring.  5.  Dyslipidemia. He is on Zocor.  6. Constipation. He is on bowel regimen.  7. History of pulmonary embolism. He is on Coumadin.  8. Insomnia. He is on trazodone.  9. Smoking. Nicotine patch is available.  10. Disposition. He will be discharged back to his group home. He will follow up with  [facility] act team.   , MD 03/03/2017, 8:55 AM 

## 2017-03-03 NOTE — Tx Team (Signed)
Interdisciplinary Treatment and Diagnostic Plan Update  03/03/2017 Time of Session: 10:30am Lance [name] MRN: [id number]  Principal Diagnosis: Schizoaffective disorder, bipolar type [facility])  Secondary Diagnoses: Principal Problem:   Schizoaffective disorder, bipolar type (HCC) Active Problems:   Hypertension   Diabetes mellitus without complication (HCC)   Hyperlipidemia   History of pulmonary embolism   Chronic anticoagulation   Tobacco use disorder   Current Medications:  Current Facility-Administered Medications  Medication Dose Route Frequency Provider Last Rate Last Dose  . acetaminophen (TYLENOL) tablet 650 mg  650 mg Oral Q6H PRN Clapacs, [name], MD      . albuterol (PROVENTIL HFA;VENTOLIN HFA) 108 (90 Base) MCG/ACT inhaler 1-2 puff  1-2 puff Inhalation Q4H PRN Clapacs, [name], MD      . alum & mag hydroxide-simeth (MAALOX/MYLANTA) 200-200-20 MG/5ML suspension 30 mL  30 mL Oral Q4H PRN Clapacs, [name], MD      . buPROPion (WELLBUTRIN XL) 24 hr tablet 150 mg  150 mg Oral Daily Pucilowska, Jolanta B, MD   150 mg at 03/03/17 0900  . clindamycin (CLEOCIN) capsule 300 mg  300 mg Oral Q8H Clapacs, [name], MD   300 mg at 03/03/17 0904  . cloZAPine (CLOZARIL) tablet 500 mg  500 mg Oral QHS Clapacs, [name], MD   500 mg at 03/02/17 2103  . divalproex (DEPAKOTE) DR tablet 500 mg  500 mg Oral Q12H Pucilowska, Jolanta B, MD   500 mg at 03/03/17 0859  . docusate sodium (COLACE) capsule 200 mg  200 mg Oral BID Clapacs, [name], MD   200 mg at 03/03/17 0859  . ibuprofen (ADVIL,MOTRIN) tablet 600 mg  600 mg Oral Q8H PRN Clapacs, [name], MD      . insulin detemir (LEVEMIR) injection 14 Units  14 Units Subcutaneous Bryn Gulling, [name], MD   14 Units at 03/03/17 [phone]  . ipratropium (ATROVENT) 0.06 % nasal spray 1 spray  1 spray Each Nare QHS Clapacs, [name], MD      . lithium carbonate (ESKALITH) CR tablet 450 mg  450 mg Oral Q12H Clapacs, [name], MD   450 mg at 03/03/17 0859  . LORazepam  (ATIVAN) tablet 2 mg  2 mg Oral Q6H PRN Clapacs, [name], MD      . magnesium hydroxide (MILK OF MAGNESIA) suspension 30 mL  30 mL Oral Daily PRN Clapacs, [name], MD      . metFORMIN (GLUCOPHAGE-XR) 24 hr tablet 500 mg  500 mg Oral BID WC Clapacs, [name], MD   500 mg at 03/03/17 0859  . mometasone-formoterol (DULERA) 200-5 MCG/ACT inhaler 2 puff  2 puff Inhalation BID Clapacs, [name], MD   2 puff at 03/03/17 0902  . nicotine (NICODERM CQ - dosed in mg/24 hours) patch 21 mg  21 mg Transdermal Daily Clapacs, [name], MD   21 mg at 03/03/17 0900  . polyethylene glycol (MIRALAX / GLYCOLAX) packet 17 g  17 g Oral Daily Clapacs, [name], MD   17 g at 03/03/17 0859  . simvastatin (ZOCOR) tablet 40 mg  40 mg Oral QHS Clapacs, [name], MD   40 mg at 03/02/17 2103  . traZODone (DESYREL) tablet 100 mg  100 mg Oral QHS Pucilowska, Jolanta B, MD   100 mg at 03/02/17 2103  . warfarin (COUMADIN) tablet 5 mg  5 mg Oral q1800 Clapacs, [name], MD   5 mg at 03/02/17 1746  . Warfarin - Physician Dosing  Inpatient   Does not apply q1800 Clapacs, [name], MD      . ziprasidone (GEODON) injection 20 mg  20 mg Intramuscular Q12H PRN Clapacs, [name], MD       PTA Medications: Prescriptions Prior to Admission  Medication Sig Dispense Refill Last Dose  . albuterol (PROVENTIL HFA;VENTOLIN HFA) 108 (90 Base) MCG/ACT inhaler Inhale 1-2 puffs into the lungs every 4 (four) hours as needed for wheezing or shortness of breath. 1 Inhaler 0 prn at prn  . buPROPion (WELLBUTRIN XL) 150 MG 24 hr tablet Take 1 tablet (150 mg total) by mouth daily. 30 tablet 0 unknown at unknown  . cloZAPine (CLOZARIL) 100 MG tablet Take 5 tablets (500 mg total) by mouth at bedtime. 150 tablet 0 unknown at unknown  . docusate sodium (COLACE) 100 MG capsule Take 2 capsules (200 mg total) by mouth 2 (two) times daily. 120 capsule 0 unknown at unknown  . Fluticasone-Salmeterol (ADVAIR) 250-50 MCG/DOSE AEPB Inhale 1 puff into the lungs 2 (two) times daily.    unknown at unknown  . insulin detemir (LEVEMIR) 100 UNIT/ML injection Inject 14 Units into the skin every morning.   unknown at unknown  . ipratropium (ATROVENT) 0.06 % nasal spray Place 1 spray into both nostrils at bedtime. 3 mL 0 unknown at unknown  . lisinopril (PRINIVIL,ZESTRIL) 2.5 MG tablet Take 2.5 mg by mouth daily.   unknown at unknown  . lithium carbonate (ESKALITH) 450 MG CR tablet Take 1 tablet (450 mg total) by mouth every 12 (twelve) hours. 60 tablet 0 unknown at unknown  . metformin (FORTAMET) 500 MG (OSM) 24 hr tablet Take 500 mg by mouth 2 (two) times daily with a meal.   unknown at unknown  . mometasone-formoterol (DULERA) 200-5 MCG/ACT AERO Inhale 2 puffs into the lungs 2 (two) times daily. 1 Inhaler 1 unknown at unknown  . omega-3 acid ethyl esters (LOVAZA) 1 g capsule Take 1 g by mouth daily.   unknown at unknown  . polyethylene glycol (MIRALAX / GLYCOLAX) packet Take 17 g by mouth daily. 30 each 0 prn at prn  . simvastatin (ZOCOR) 40 MG tablet Take 40 mg by mouth at bedtime.   unknown at unknown  . traZODone (DESYREL) 50 MG tablet Take 75 mg by mouth at bedtime.   unknown at unknown  . warfarin (COUMADIN) 10 MG tablet Take 10 mg by mouth daily.   unknown at unknown  . warfarin (COUMADIN) 5 MG tablet Take 1 tablet (5 mg total) by mouth daily at 6 PM. 30 tablet 0 unknown at unknown    Patient Stressors:    Patient Strengths:    Treatment Modalities: Medication Management, Group therapy, Case management,  1 to 1 session with clinician, Psychoeducation, Recreational therapy.   Physician Treatment Plan for Primary Diagnosis: Schizoaffective disorder, bipolar type (HCC) Long Term Goal(s): Improvement in symptoms so as ready for discharge NA   Short Term Goals: Ability to identify changes in lifestyle to reduce recurrence of condition will improve Ability to verbalize feelings will improve Ability to disclose and discuss suicidal ideas Ability to demonstrate self-control  will improve Ability to identify and develop effective coping behaviors will improve Ability to identify triggers associated with substance abuse/mental health issues will improve NA  Medication Management: Evaluate patient's response, side effects, and tolerance of medication regimen.  Therapeutic Interventions: 1 to 1 sessions, Unit Group sessions and Medication administration.  Evaluation of Outcomes: Progressing  Physician Treatment Plan for Secondary Diagnosis: Principal Problem:  Schizoaffective disorder, bipolar type (HCC) Active Problems:   Hypertension   Diabetes mellitus without complication (HCC)   Hyperlipidemia   History of pulmonary embolism   Chronic anticoagulation   Tobacco use disorder  Long Term Goal(s): Improvement in symptoms so as ready for discharge NA   Short Term Goals: Ability to identify changes in lifestyle to reduce recurrence of condition will improve Ability to verbalize feelings will improve Ability to disclose and discuss suicidal ideas Ability to demonstrate self-control will improve Ability to identify and develop effective coping behaviors will improve Ability to identify triggers associated with substance abuse/mental health issues will improve NA     Medication Management: Evaluate patient's response, side effects, and tolerance of medication regimen.  Therapeutic Interventions: 1 to 1 sessions, Unit Group sessions and Medication administration.  Evaluation of Outcomes: Progressing   RN Treatment Plan for Primary Diagnosis: Schizoaffective disorder, bipolar type (HCC) Long Term Goal(s): Knowledge of disease and therapeutic regimen to maintain health will improve  Short Term Goals: Ability to verbalize frustration and anger appropriately will improve, Ability to verbalize feelings will improve and Ability to identify and develop effective coping behaviors will improve  Medication Management: RN will administer medications as ordered by  provider, will assess and evaluate patient's response and provide education to patient for prescribed medication. RN will report any adverse and/or side effects to prescribing provider.  Therapeutic Interventions: 1 on 1 counseling sessions, Psychoeducation, Medication administration, Evaluate responses to treatment, Monitor vital signs and CBGs as ordered, Perform/monitor CIWA, COWS, AIMS and Fall Risk screenings as ordered, Perform wound care treatments as ordered.  Evaluation of Outcomes: Progressing   LCSW Treatment Plan for Primary Diagnosis: Schizoaffective disorder, bipolar type (HCC) Long Term Goal(s): Safe transition to appropriate next level of care at discharge, Engage patient in therapeutic group addressing interpersonal concerns.  Short Term Goals: Engage patient in aftercare planning with referrals and resources, Increase emotional regulation, Identify triggers associated with mental health/substance abuse issues and Increase skills for wellness and recovery  Therapeutic Interventions: Assess for all discharge needs, 1 to 1 time with Social worker, Explore available resources and support systems, Assess for adequacy in community support network, Educate family and significant other(s) on suicide prevention, Complete Psychosocial Assessment, Interpersonal group therapy.  Evaluation of Outcomes: Progressing   Progress in Treatment: Attending groups: Yes. Participating in groups: Yes. Taking medication as prescribed: Yes. Toleration medication: Yes. Family/Significant other contact made: No, will contact:    Patient understands diagnosis: Yes. Discussing patient identified problems/goals with staff: Yes. Medical problems stabilized or resolved: Yes. Denies suicidal/homicidal ideation: Yes. Issues/concerns per patient self-inventory: No. Other:    New problem(s) identified: No, Describe:     New Short Term/Long Term Goal(s):  Discharge Plan or Barriers:   Reason for  Continuation of Hospitalization: Medication stabilization Other; describe Mood instability, hallucinations  Estimated Length of Stay:5-7 days  Attendees: Patient:Lance [name] 03/03/2017 12:26 PM  Physician: Braulio Conte Pucilowska 03/03/2017 12:26 PM  Nursing: Hulan Amato, RN 03/03/2017 12:26 PM  RN Care Manager: 03/03/2017 12:26 PM  Social Worker: Jake Shark, LCSW 03/03/2017 12:26 PM  Recreational Therapist:  03/03/2017 12:26 PM  Other:  03/03/2017 12:26 PM  Other:  03/03/2017 12:26 PM  Other: 03/03/2017 12:26 PM    Scribe for Treatment Team: Glennon Mac, LCSW 03/03/2017 12:26 PM

## 2017-03-03 NOTE — Plan of Care (Signed)
Problem: Education: Goal: Ability to make informed decisions regarding treatment will improve Outcome: Progressing  Patient working on decision making  technique  Problem: Coping: Goal: Ability to cope will improve Outcome: Progressing Continue to work on coping skills   Problem: Health Behavior/Discharge Planning: Goal: Identification of resources available to assist in meeting health care needs will improve Outcome: Progressing Working  With Arts development officer  On resources   Problem: Medication: Goal: Compliance with prescribed medication regimen will improve Outcome: Progressing Compliant with medication   Problem: Activity: Goal: Sleeping patterns will improve Outcome: Progressing Voice no concerns around  sleep  Problem: [facility] Concurrent Medical Problem Goal: STG-Compliance with medication and/or treatment as ordered (STG-Compliance with medication and/or treatment as ordered by MD)  Outcome: Progressing Compliant  With medication  Orders

## 2017-03-03 NOTE — Progress Notes (Signed)
Patient ID: Lance [name], male   DOB: 1975-11-26, 42 y.o.   MRN: [id number] CSW faxed Release for [facility] to Corbin Ade at [phone].  Asked her to please fax back ASAP.  Lance Shark, LCSW

## 2017-03-03 NOTE — [facility] Group Notes (Signed)
[facility] Group Notes:  (Nursing/MHT/Case Management/Adjunct)  Date:  03/03/2017  Time:  2:03 AM  Type of Therapy:  Psychoeducational Skills  Participation Level:  Active  Participation Quality:  Appropriate, Attentive and Sharing  Affect:  Appropriate  Cognitive:  Appropriate  Insight:  Appropriate and Good  Engagement in Group:  Engaged  Modes of Intervention:  Discussion, Socialization and Support  Summary of Progress/Problems:  Lance [name] 03/03/2017, 2:03 AM

## 2017-03-03 NOTE — Progress Notes (Signed)
Patient ID: Lance [name], male   DOB: [birth year], 41 y.o.   MRN: [id number] LCSW Group Therapy Note  03/03/2017 3:00pm  Type of Therapy/Topic:  Group Therapy:  Emotion Regulation  Participation Level:  Minimal   Description of Group:   The purpose of this group is to assist patients in learning to regulate negative emotions and experience positive emotions. Patients will be guided to discuss ways in which they have been vulnerable to their negative emotions. These vulnerabilities will be juxtaposed with experiences of positive emotions or situations, and patients will be challenged to use positive emotions to combat negative ones. Special emphasis will be placed on coping with negative emotions in conflict situations, and patients will process healthy conflict resolution skills.  Therapeutic Goals: 1. Patient will identify two positive emotions or experiences to reflect on in order to balance out negative emotions 2. Patient will label two or more emotions that they find the most difficult to experience 3. Patient will demonstrate positive conflict resolution skills through discussion and/or role plays  Summary of Patient Progress:  Pt in and out of group unable to participate much at this time.  Verbalizes he doesn't feel like he can be around so many people right now.  Pacing the halls at the end of group. Pt pleasant though and not disruptive of group process.     Therapeutic Modalities:   Cognitive Behavioral Therapy Feelings Identification Dialectical Behavioral Therapy   Glennon Mac, LCSW 03/03/2017 5:31 PM

## 2017-03-03 NOTE — Plan of Care (Signed)
Problem: Education: Goal: Ability to make informed decisions regarding treatment will improve Outcome: Progressing Patient  Working on Event organiser   Problem: Coping: Goal: Ability to cope will improve Outcome: Progressing Working on Pharmacologist   Problem: Health Behavior/Discharge Planning: Goal: Identification of resources available to assist in meeting health care needs will improve Outcome: Progressing Patient abl e to identify  Resources   Problem: Medication: Goal: Compliance with prescribed medication regimen will improve Outcome: Progressing Patient  Verbalize understanding   Of medication  Received   Problem: Activity: Goal: Sleeping patterns will improve Outcome: Progressing Voice no concerns around  sleep

## 2017-03-03 NOTE — Progress Notes (Signed)
Patient ID: Lance [name], male   DOB: [birth year], 41 y.o.   MRN: [id number]  Grumpy, disheveled, pacing thoughtfully, distended abdomen, pleasant on approach, "I can't be around people, can I get my medication late." Minimal interaction with peers except for during snacks; denied SI/HI, appeared to be responding to internal stimuli but denied it.

## 2017-03-03 NOTE — Progress Notes (Signed)
D: Patient  Pacing floor all shift  Limited participation  With peers and staff . Patient would go into room for group  For a short while .  Appetite good   Voice he is sleeping well with the  Sleep medication. Denies  Auditory hallucinations Denies suicidal  homicidal ideations  .   No pain concerns . Appropriate ADL'S. Interacting with peers and staff.  A: Encourage patient participation with unit programming . Instruction  Given on  Medication , verbalize understanding. R: Voice no other concerns. Staff continue to monitor

## 2017-03-03 NOTE — [facility] Group Notes (Signed)
[facility] Group Notes:  (Nursing/MHT/Case Management/Adjunct)  Date:  03/03/2017  Time:  11:23 AM  Type of Therapy:  Psychoeducational Skills  Participation Level:  Did Not Attend   Lance [name] 03/03/2017, 11:23 AM
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
## 2017-03-04 LAB — GLUCOSE, CAPILLARY
GLUCOSE-CAPILLARY: 122 mg/dL — AB (ref 65–99)
Glucose-Capillary: 112 mg/dL — ABNORMAL HIGH (ref 65–99)
Glucose-Capillary: 176 mg/dL — ABNORMAL HIGH (ref 65–99)
Glucose-Capillary: 110 mg/dL — ABNORMAL HIGH (ref 65–99)

## 2017-03-04 LAB — PROTIME-INR
INR: 1.48
Prothrombin Time: 17.8 seconds — ABNORMAL HIGH (ref 11.4–15.2)

## 2017-03-04 LAB — CLOZAPINE (CLOZARIL)
Clozapine Lvl: 485 ng/mL (ref 350–650)
NORCLOZAPINE: 277 ng/mL
TOTAL(CLOZ+ NORCLOZ): 762 ng/mL

## 2017-03-04 NOTE — Plan of Care (Signed)
Problem: Education: Goal: Ability to make informed decisions regarding treatment will improve Outcome: Progressing Attending groups  On unit to help increase decision making  Skills   Problem: Coping: Goal: Ability to cope will improve Outcome: Progressing Working on coping skills   Problem: Health Behavior/Discharge Planning: Goal: Identification of resources available to assist in meeting health care needs will improve Outcome: Progressing Staff assist with resources  That available for him   Problem: Medication: Goal: Compliance with prescribed medication regimen will improve Outcome: Progressing Compliant with medication given   Problem: Activity: Goal: Sleeping patterns will improve Outcome: Progressing Voice no concerns  Around  Sleep   Problem: [facility] Concurrent Medical Problem Goal: STG-Compliance with medication and/or treatment as ordered (STG-Compliance with medication and/or treatment as ordered by MD)  Outcome: Progressing Compliant with medication

## 2017-03-04 NOTE — [facility] Group Notes (Signed)
LCSW Group Therapy Note  03/04/2017 1:15pm  Type of Therapy/Topic:  Group Therapy:  Balance in Life  Participation Level:  Active  Description of Group:    This group will address the concept of balance and how it feels and looks when one is unbalanced. Patients will be encouraged to process areas in their lives that are out of balance and identify reasons for remaining unbalanced. Facilitators will guide patients in utilizing problem-solving interventions to address and correct the stressor making their life unbalanced. Understanding and applying boundaries will be explored and addressed for obtaining and maintaining a balanced life. Patients will be encouraged to explore ways to assertively make their unbalanced needs known to significant others in their lives, using other group members and facilitator for support and feedback.  Therapeutic Goals: 1. Patient will identify two or more emotions or situations they have that consume much of in their lives. 2. Patient will identify signs/triggers that life has become out of balance:  3. Patient will identify two ways to set boundaries in order to achieve balance in their lives:  4. Patient will demonstrate ability to communicate their needs through discussion and/or role plays  Summary of Patient Progress:  Was able to attend to group process and contribute appropriately, remained in room for most of group.  Identified "having a level, steady mind" as indicator of balance in his life.  Referenced friend who "does yoga" as example of someone who practices balance. Was unable to articulate personal stressors/triggers/concerns; however was attentive to contributions of others.      Therapeutic Modalities:   Cognitive Behavioral Therapy Solution-Focused Therapy Assertiveness Training  Sallee Lange, [place] 03/04/2017 2:06 PM

## 2017-03-04 NOTE — Plan of Care (Signed)
Problem: Activity: Goal: Sleeping patterns will improve Outcome: Progressing Patient slept for Estimated Hours of 7.15; Precautionary checks every 15 minutes for safety maintained, room free of safety hazards, patient sustains no injury or falls during this shift.    

## 2017-03-04 NOTE — Progress Notes (Signed)
[facility] MD Progress Note  03/04/2017 10:00 AM De P Effinger  MRN:  [id number]  Subjective:   03/03/2017. Lance [name] met with treatment team today. He still feels on the edge and his "head is not straight". He denies thoughts of hurting himself. He denies intention to hurt others but feels uneasy around people. There is no history of violence. Lance [name] has been out of his room today somewhat restless walking around the unit. He slept well with Trazodone. He accepts medications and tolerates them well. He reports no side effects from Depakote we started yesterday. Due to the history of PE we do not want to introduce another antipsychotic. We are checking Clozapine level. His hygiene is poor. This is uncharacteristic.   03/04/2017. Lance [name] reports no improvement. He still feels on the edge. He denies thoughts of hurting other people. He has questions about addition of Depakote. He was explained again that we try to avoid medication that would increase his risk for PE. There are no somatic complaints. He looks unkept. Sleep and appetite are fair.   Per nursing: Green sportswear over scrub pant, pacing the hall quietly, minimal interaction with peers, when asked how was his day, patient said "So so.." poor eye contact, evasive, did not say much; complied with medications, denied SI/HI/AVH.  Principal Problem: Schizoaffective disorder, bipolar type (HCC) Diagnosis:   Patient Active Problem List   Diagnosis Date Noted  . Tobacco use disorder [F17.200] 09/01/2016  . Schizoaffective disorder, bipolar type (HCC) [F25.0] 05/22/2016  . Diabetes mellitus without complication (HCC) [E11.9] 05/21/2016  . Hyperlipidemia [E78.5] 05/21/2016  . History of pulmonary embolism [Z86.711] 05/21/2016  . Chronic anticoagulation [Z79.01] 05/21/2016  . Hypertension [I10] 09/25/2015   Total Time spent with patient: 30 minutes  Past Psychiatric History: Long history of schizophrenia or schizoaffective disorder with multiple  hospitalizations and medication trials and a history of PE on Risperdal. He is more stable on Clozapine. He is in the care of [facility] ACT team.   Past Medical History:  Past Medical History:  Diagnosis Date  . Depression   . Diabetes mellitus without complication (HCC)   . Hyperlipidemia   . Hypertension   . Lupus anticoagulant disorder (HCC) 06/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  . PE (pulmonary thromboembolism) (HCC)   . Schizo affective schizophrenia (HCC)   . Supratherapeutic INR 11/19/2016   History reviewed. No pertinent surgical history. Family History:  Family History  Problem Relation Age of Onset  . CAD Mother   . CAD Sister    Family Psychiatric  History: see H&P. Social History:  History  Alcohol Use No    Comment: occassionally     History  Drug Use  . Types: Marijuana    Comment: Last use 11/29/16    Social History   Social History  . Marital status: Single    Spouse name: N/A  . Number of children: N/A  . Years of education: N/A   Social History Main Topics  . Smoking status: Current Every Day Smoker    Packs/day: 1.00    Years: 23.00    Types: Cigarettes  . Smokeless tobacco: Never Used  . Alcohol use No     Comment: occassionally  . Drug use: Yes    Types: Marijuana     Comment: Last use 11/29/16  . Sexual activity: No     Comment: occasional marijuana- none recently   Other Topics Concern  . None   Social History Narrative     From a group home in [place]   Additional Social History: The patient is an incompetent adult and [place] DSS is the guardian. He just relocated to a new group home a week or so ago. He may return there.                         Sleep: Fair  Appetite:  Fair  Current Medications: Current Facility-Administered Medications  Medication Dose Route Frequency Provider Last Rate Last Dose  . acetaminophen (TYLENOL) tablet 650 mg  650 mg Oral Q6H PRN Clapacs, [name], MD      . albuterol  (PROVENTIL HFA;VENTOLIN HFA) 108 (90 Base) MCG/ACT inhaler 1-2 puff  1-2 puff Inhalation Q4H PRN Clapacs, Madie Reno, MD   2 puff at 03/03/17 2127  . alum & mag hydroxide-simeth (MAALOX/MYLANTA) 200-200-20 MG/5ML suspension 30 mL  30 mL Oral Q4H PRN Clapacs, [name], MD      . buPROPion (WELLBUTRIN XL) 24 hr tablet 150 mg  150 mg Oral Daily Yuriko Portales B, MD   150 mg at 03/04/17 0803  . clindamycin (CLEOCIN) capsule 300 mg  300 mg Oral Q8H Clapacs, [name], MD   300 mg at 03/04/17 0175  . cloZAPine (CLOZARIL) tablet 500 mg  500 mg Oral QHS Clapacs, Madie Reno, MD   500 mg at 03/03/17 2125  . divalproex (DEPAKOTE) DR tablet 500 mg  500 mg Oral Q12H Novali Vollman B, MD   500 mg at 03/04/17 0803  . docusate sodium (COLACE) capsule 200 mg  200 mg Oral BID Clapacs, Madie Reno, MD   200 mg at 03/04/17 0803  . ibuprofen (ADVIL,MOTRIN) tablet 600 mg  600 mg Oral Q8H PRN Clapacs, [name], MD      . insulin detemir (LEVEMIR) injection 14 Units  14 Units Subcutaneous Honor Junes, Madie Reno, MD   14 Units at 03/04/17 0747  . ipratropium (ATROVENT) 0.06 % nasal spray 1 spray  1 spray Each Nare QHS Clapacs, Madie Reno, MD   1 spray at 03/03/17 2134  . lithium carbonate (ESKALITH) CR tablet 450 mg  450 mg Oral Q12H Clapacs, Madie Reno, MD   450 mg at 03/04/17 0803  . LORazepam (ATIVAN) tablet 2 mg  2 mg Oral Q6H PRN Clapacs, [name], MD      . magnesium hydroxide (MILK OF MAGNESIA) suspension 30 mL  30 mL Oral Daily PRN Clapacs, [name], MD      . metFORMIN (GLUCOPHAGE-XR) 24 hr tablet 500 mg  500 mg Oral BID WC Clapacs, Madie Reno, MD   500 mg at 03/04/17 0803  . mometasone-formoterol (DULERA) 200-5 MCG/ACT inhaler 2 puff  2 puff Inhalation BID Clapacs, Madie Reno, MD   2 puff at 03/04/17 0804  . nicotine (NICODERM CQ - dosed in mg/24 hours) patch 21 mg  21 mg Transdermal Daily Clapacs, [name], MD   21 mg at 03/03/17 0900  . polyethylene glycol (MIRALAX / GLYCOLAX) packet 17 g  17 g Oral Daily Clapacs, Madie Reno, MD   17 g at 03/04/17  0803  . simvastatin (ZOCOR) tablet 40 mg  40 mg Oral QHS Clapacs, Madie Reno, MD   40 mg at 03/03/17 2125  . traZODone (DESYREL) tablet 100 mg  100 mg Oral QHS Yoselyn Mcglade B, MD   100 mg at 03/03/17 2125  . warfarin (COUMADIN) tablet 5 mg  5 mg Oral q1800 Clapacs, Madie Reno, MD   5 mg at  03/03/17 1718  . Warfarin - Physician Dosing Inpatient   Does not apply q1800 Clapacs, Madie Reno, MD      . ziprasidone (GEODON) injection 20 mg  20 mg Intramuscular Q12H PRN Clapacs, Madie Reno, MD        Lab Results:  Results for orders placed or performed during the hospital encounter of 03/02/17 (from the past 48 hour(s))  Glucose, capillary     Status: None   Collection Time: 03/02/17  9:07 PM  Result Value Ref Range   Glucose-Capillary 94 65 - 99 mg/dL  Glucose, capillary     Status: Abnormal   Collection Time: 03/03/17  7:16 AM  Result Value Ref Range   Glucose-Capillary 119 (H) 65 - 99 mg/dL  Protime-INR     Status: Abnormal   Collection Time: 03/03/17  7:18 AM  Result Value Ref Range   Prothrombin Time 15.8 (H) 11.4 - 15.2 seconds   INR 1.27   Hemoglobin A1c     Status: None   Collection Time: 03/03/17  7:18 AM  Result Value Ref Range   Hgb A1c MFr Bld 5.3 4.8 - 5.6 %    Comment: (NOTE) Pre diabetes:          5.7%-6.4% Diabetes:              >6.4% Glycemic control for   <7.0% adults with diabetes    Mean Plasma Glucose 105.41 mg/dL    Comment: Performed at [facility], [place] [address] [place], [place] [address]  Lipid panel     Status: Abnormal   Collection Time: 03/03/17  7:18 AM  Result Value Ref Range   Cholesterol 131 0 - 200 mg/dL   Triglycerides 207 (H) <150 mg/dL   HDL 31 (L) >40 mg/dL   Total CHOL/HDL Ratio 4.2 RATIO   VLDL 41 (H) 0 - 40 mg/dL   LDL Cholesterol 59 0 - 99 mg/dL    Comment:        Total Cholesterol/HDL:CHD Risk Coronary Heart Disease Risk Table                     Men   Women  1/2 Average Risk   3.4   3.3  Average Risk       5.0   4.4  2 X Average  Risk   9.6   7.1  3 X Average Risk  23.4   11.0        Use the calculated Patient Ratio above and the CHD Risk Table to determine the patient's CHD Risk.        ATP III CLASSIFICATION (LDL):  <100     mg/dL   Optimal  100-129  mg/dL   Near or Above                    Optimal  130-159  mg/dL   Borderline  160-189  mg/dL   High  >190     mg/dL   Very High   TSH     Status: None   Collection Time: 03/03/17  7:18 AM  Result Value Ref Range   TSH 2.766 0.350 - 4.500 uIU/mL    Comment: Performed by a 3rd Generation assay with a functional sensitivity of <=0.01 uIU/mL.  Glucose, capillary     Status: Abnormal   Collection Time: 03/03/17 11:36 AM  Result Value Ref Range   Glucose-Capillary 204 (H) 65 - 99 mg/dL   Comment 1  Notify RN   Glucose, capillary     Status: Abnormal   Collection Time: 03/03/17  4:28 PM  Result Value Ref Range   Glucose-Capillary 112 (H) 65 - 99 mg/dL   Comment 1 Notify RN   Glucose, capillary     Status: Abnormal   Collection Time: 03/03/17  9:50 PM  Result Value Ref Range   Glucose-Capillary 176 (H) 65 - 99 mg/dL  Protime-INR     Status: Abnormal   Collection Time: 03/04/17  7:00 AM  Result Value Ref Range   Prothrombin Time 17.8 (H) 11.4 - 15.2 seconds   INR 1.48   Glucose, capillary     Status: Abnormal   Collection Time: 03/04/17  7:11 AM  Result Value Ref Range   Glucose-Capillary 122 (H) 65 - 99 mg/dL    Blood Alcohol level:  Lab Results  Component Value Date   ETH <5 02/26/2017   ETH <5 12/27/2016    Metabolic Disorder Labs: Lab Results  Component Value Date   HGBA1C 5.3 03/03/2017   MPG 105.41 03/03/2017   MPG 111 12/30/2016   Lab Results  Component Value Date   PROLACTIN 2.0 (L) 08/31/2016   PROLACTIN 4.8 05/22/2016   Lab Results  Component Value Date   CHOL 131 03/03/2017   TRIG 207 (H) 03/03/2017   HDL 31 (L) 03/03/2017   CHOLHDL 4.2 03/03/2017   VLDL 41 (H) 03/03/2017   LDLCALC 59 03/03/2017   LDLCALC 59 12/30/2016     Physical Findings: AIMS:  , ,  ,  ,    CIWA:    COWS:     Musculoskeletal: Strength & Muscle Tone: within normal limits Gait & Station: normal Patient leans: N/A  Psychiatric Specialty Exam: Physical Exam  Nursing note and vitals reviewed. Psychiatric: His speech is normal. His mood appears anxious. His affect is blunt. He is hyperactive. Thought content is paranoid and delusional. Cognition and memory are normal. He expresses impulsivity.    Review of Systems  Constitutional: Negative.   HENT: Negative.   Eyes: Negative.   Respiratory: Negative.   Cardiovascular: Negative.   Gastrointestinal: Negative.   Genitourinary: Negative.   Musculoskeletal: Negative.   Skin: Negative.   Neurological: Negative.   Endo/Heme/Allergies: Negative.   Psychiatric/Behavioral: The patient is nervous/anxious and has insomnia.     Blood pressure 97/67, pulse 79, temperature 97.8 F (36.6 C), temperature source Oral, resp. rate 18, height 5' 10" (1.778 m), weight 86.2 kg (190 lb), SpO2 100 %.Body mass index is 27.26 kg/m.  General Appearance: Disheveled  Eye Contact:  Good  Speech:  Normal Rate  Volume:  Normal  Mood:  Anxious  Affect:  Congruent  Thought Process:  Goal Directed and Descriptions of Associations: Intact  Orientation:  Full (Time, Place, and Person)  Thought Content:  Delusions and Paranoid Ideation  Suicidal Thoughts:  No  Homicidal Thoughts:  No  Memory:  Immediate;   Fair Recent;   Fair Remote;   Fair  Judgement:  Fair  Insight:  Present  Psychomotor Activity:  Normal  Concentration:  Concentration: Fair and Attention Span: Fair  Recall:  Fair  Fund of Knowledge:  Fair  Language:  Fair  Akathisia:  No  Handed:  Right  AIMS (if indicated):     Assets:  Communication Skills Desire for Improvement Financial Resources/Insurance Housing Resilience Social Support  ADL's:  Intact  Cognition:  WNL  Sleep:  Number of Hours: 7.15     Treatment Plan  Summary:   Daily contact with patient to assess and evaluate symptoms and progress in treatment and Medication management   Lance [name] is a 41-year-old male with a history of schizoaffective disorder admitting for worsening of psychosis in spite of good treatment compliance.  1. Psychosis. We continue clozapine 500 mg nightly for psychosis, Wellbutrin for depression, and lithium for mood stabilization. Lithium level was therapeutic on admission. We added low dose Depakote for further mood stabilization. We will check VPA level before discharge. Clozapine level pending.   2. COPD. He is on inhalers.  3. Tooth abscess. He is on clindamycin.  4. Diabetes. He is on Lantus and metformin, ADA diet and blood glucose monitoring. Blood glocose is not elevated. We will discontinue testing.  5. Dyslipidemia. He is on Zocor.  6. Constipation. He is on bowel regimen.  7. History of pulmonary embolism. He is on Coumadin per pharmacy dosing.  8. Insomnia. He is on trazodone.  9. Smoking. Nicotine patch is available.  10. Disposition. He will be discharged back to his group home. He will follow up with [facility] act team.   , MD 03/04/2017, 10:00 AM 

## 2017-03-04 NOTE — Progress Notes (Signed)
   D:Affect flat on approach. No cheerfulness noted in face . Isolates to room and pacing the hall of the unit alone  Limited eye contact . Limited interaction with peers on unit . Compliant with ADL'S and personal chores , washing his clothes.  Appetite good and voice no concerns around sleep Patient  Denies auditory hallucinations  And suicidally. Attending unit programing goes in for a short period then comes out .  A: Encourage patient participation with unit programming . Instruction  Given on  Medication , verbalize understanding. R: Voice no other concerns. Staff continue to monitor

## 2017-03-04 NOTE — Progress Notes (Signed)
Patient ID: Lance [name], male   DOB: 1975/08/15, 41 y.o.   MRN: [id number] [place] Si sportswear over scrub pant, pacing the hall quietly, minimal interaction with peers, when asked how was his day, patient said "So so.." poor eye contact, evasive, did not say much; complied with medications, denied SI/HI/AVH.

## 2017-03-05 LAB — CBC WITH DIFFERENTIAL/PLATELET
BASOS PCT: 0 %
Basophils Absolute: 0 10*3/uL (ref 0–0.1)
Eosinophils Absolute: 0 10*3/uL (ref 0–0.7)
Eosinophils Relative: 0 %
HEMATOCRIT: 44.2 % (ref 40.0–52.0)
Hemoglobin: 15.7 g/dL (ref 13.0–18.0)
LYMPHS ABS: 1.6 10*3/uL (ref 1.0–3.6)
Lymphocytes Relative: 25 %
MCH: 32 pg (ref 26.0–34.0)
MCHC: 35.5 g/dL (ref 32.0–36.0)
MCV: 90.2 fL (ref 80.0–100.0)
MONO ABS: 0.9 10*3/uL (ref 0.2–1.0)
Monocytes Relative: 14 %
NEUTROS ABS: 4 10*3/uL (ref 1.4–6.5)
Neutrophils Relative %: 61 %
Platelets: 226 10*3/uL (ref 150–440)
RBC: 4.91 MIL/uL (ref 4.40–5.90)
RDW: 13.4 % (ref 11.5–14.5)
WBC: 6.5 10*3/uL (ref 3.8–10.6)

## 2017-03-05 LAB — GLUCOSE, CAPILLARY
Glucose-Capillary: 90 mg/dL (ref 65–99)
Glucose-Capillary: 95 mg/dL (ref 65–99)
Glucose-Capillary: 125 mg/dL — ABNORMAL HIGH (ref 65–99)

## 2017-03-05 LAB — PROTIME-INR
INR: 1.67
PROTHROMBIN TIME: 19.6 s — AB (ref 11.4–15.2)

## 2017-03-05 MED ORDER — CLONAZEPAM 1 MG PO TABS
1.0000 mg | ORAL_TABLET | Freq: Two times a day (BID) | ORAL | Status: DC
  Administered 2017-03-05 – 2017-03-08 (×6): 1 mg via ORAL
  Filled 2017-03-05 (×6): qty 1

## 2017-03-05 NOTE — Plan of Care (Signed)
Problem: Medication: Goal: Compliance with prescribed medication regimen will improve Outcome: Progressing Patient medication compliant   

## 2017-03-05 NOTE — Progress Notes (Signed)
Patient ID: Lance [name], male   DOB: 1975-09-05, 41 y.o.   MRN: [id number] Quietly pacing, disheveled, minimal interaction with others, visible in the day room only during snacks time,  denied pain, A&Ox3, denied SI/HI/AVH.

## 2017-03-05 NOTE — Progress Notes (Signed)
[facility] MD Progress Note  03/05/2017 7:45 PM Lance [name]  MRN:  [id number]  Subjective:  History of present illness. Information was obtained from the patient and the chart. Lance [name] came to the emergency room complaining of increased of irritation, getting bad feelings about other people, and feeling that he is about to lash out. He did not acted out on these feelings but they are very ego-dystonic. The patient is not a violent person. He decided to come to the hospital. The patient denies any symptoms of depression, anxiety or psychosis. He has been stable of on these medications Clozaril, Wellbutrin, and lithium. This is confirmed by therapeutic lithium level. She does not use alcohol or illicit substances. The patient reports that recently she moved to a new group home. It is cleaner, newer, and nicer than the old one. The food is good. Patient was somewhat anxious about the location. He has his room there. Change of group homes was recommended by his act team as well as his guardian.   03/03/2017. Lance [name] met with treatment team today. He still feels on the edge and his "head is not straight". He denies thoughts of hurting himself. He denies intention to hurt others but feels uneasy around people. There is no history of violence. Lance [name] has been out of his room today somewhat restless walking around the unit. He slept well with Trazodone. He accepts medications and tolerates them well. He reports no side effects from Depakote we started yesterday. Due to the history of PE we do not want to introduce another antipsychotic. We are checking Clozapine level. His hygiene is poor. This is uncharacteristic.   03/04/2017. Lance [name] reports no improvement. He still feels on the edge. He denies thoughts of hurting other people. He has questions about addition of Depakote. He was explained again that we try to avoid medication that would increase his risk for PE. There are no somatic complaints. He looks unkept.  Sleep and appetite are fair.   03/05/2017. Lance [name] still feels on the edge and has passing thoughts of losing control. He is annoyed by some of his peers but denies wanting to hurt anybody. He looks a little better groomed today. He still has been pacing the hallways. He came to the office and had a recent conversation with me. He noticed that today's the first day of fall. He didn't go outside briefly. He is asking for something to curb his anxiety. He tolerated Depakote well.  Per nursing: D:Affect flat on approach. No cheerfulness noted in face . Isolates to room and pacing the hall of the unit alone  Limited eye contact . Limited interaction with peers on unit . Compliant with ADL'S and personal chores , washing his clothes.  Appetite good and voice no concerns around sleep Patient  Denies auditory hallucinations  And suicidally. Attending unit programing goes in for a short period then comes out .  A: Encourage patient participation with unit programming . Instruction  Given on  Medication , verbalize understanding. R: Voice no other concerns. Staff continue to monitor  Principal Problem: Schizoaffective disorder, bipolar type ([place]) Diagnosis:   Patient Active Problem List   Diagnosis Date Noted  . Tobacco use disorder [F17.200] 09/01/2016  . Schizoaffective disorder, bipolar type ([place]) [F25.0] 05/22/2016  . Diabetes mellitus without complication ([place]) [B28.4] 05/21/2016  . Hyperlipidemia [E78.5] 05/21/2016  . History of pulmonary embolism [Z86.711] 05/21/2016  . Chronic anticoagulation [Z79.01] 05/21/2016  . Hypertension [I10] 09/25/2015   Total  Time spent with patient: 30 minutes  Past Psychiatric History: Long history of schizophrenia or schizoaffective disorder with multiple hospitalizations and medication trials and a history of PE on Risperdal. He is more stable on Clozapine. He is in the care of [facility] ACT team.   Past Medical History:  Past Medical History:  Diagnosis  Date  . Depression   . Diabetes mellitus without complication ([place])   . Hyperlipidemia   . Hypertension   . Lupus anticoagulant disorder ([place]) 06/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  . PE (pulmonary thromboembolism) ([place])   . Schizo affective schizophrenia ([place])   . Supratherapeutic INR 11/19/2016   History reviewed. No pertinent surgical history. Family History:  Family History  Problem Relation Age of Onset  . CAD Mother   . CAD Sister    Family Psychiatric  History: see H&P. Social History:  History  Alcohol Use No    Comment: occassionally     History  Drug Use  . Types: Marijuana    Comment: Last use 11/29/16    Social History   Social History  . Marital status: Single    Spouse name: N/A  . Number of children: N/A  . Years of education: N/A   Social History Main Topics  . Smoking status: [name] Every Day Smoker    Packs/day: 1.00    Years: 23.00    Types: Cigarettes  . Smokeless tobacco: Never Used  . Alcohol use No     Comment: occassionally  . Drug use: Yes    Types: Marijuana     Comment: Last use 11/29/16  . Sexual activity: No     Comment: occasional marijuana- none recently   Other Topics Concern  . None   Social History Narrative   From a group home in [place]   Additional Social History: The patient is an incompetent adult and [place] [employer] is the guardian. He just relocated to a new group home a week or so ago. He may return there.                         Sleep: Fair  Appetite:  Fair  [name] Medications: [name] Facility-Administered Medications  Medication Dose Route Frequency Provider Last Rate Last Dose  . acetaminophen (TYLENOL) tablet 650 mg  650 mg Oral Q6H PRN Clapacs, [name], MD      . albuterol (PROVENTIL HFA;VENTOLIN HFA) 108 (90 Base) MCG/ACT inhaler 1-2 puff  1-2 puff Inhalation Q4H PRN Clapacs, Madie Reno, MD   2 puff at 03/03/17 2127  . alum & mag hydroxide-simeth (MAALOX/MYLANTA) 200-200-20 MG/5ML  suspension 30 mL  30 mL Oral Q4H PRN Clapacs, [name], MD      . buPROPion (WELLBUTRIN XL) 24 hr tablet 150 mg  150 mg Oral Daily Pucilowska, Jolanta B, MD   150 mg at 03/05/17 0814  . clonazePAM (KLONOPIN) tablet 1 mg  1 mg Oral BID Pucilowska, Jolanta B, MD   1 mg at 03/05/17 1651  . cloZAPine (CLOZARIL) tablet 500 mg  500 mg Oral QHS Clapacs, Madie Reno, MD   500 mg at 03/04/17 2107  . divalproex (DEPAKOTE) DR tablet 500 mg  500 mg Oral Q12H Pucilowska, Jolanta B, MD   500 mg at 03/05/17 0814  . docusate sodium (COLACE) capsule 200 mg  200 mg Oral BID Clapacs, Madie Reno, MD   200 mg at 03/05/17 1650  . ibuprofen (ADVIL,MOTRIN) tablet 600 mg  600 mg Oral Q8H PRN Clapacs, [name], MD      . insulin detemir (LEVEMIR) injection 14 Units  14 Units Subcutaneous Honor Junes, Madie Reno, MD   14 Units at 03/05/17 [phone]  . ipratropium (ATROVENT) 0.06 % nasal spray 1 spray  1 spray Each Nare QHS Clapacs, Madie Reno, MD   1 spray at 03/03/17 2134  . lithium carbonate (ESKALITH) CR tablet 450 mg  450 mg Oral Q12H Clapacs, Madie Reno, MD   450 mg at 03/05/17 0814  . magnesium hydroxide (MILK OF MAGNESIA) suspension 30 mL  30 mL Oral Daily PRN Clapacs, [name], MD      . metFORMIN (GLUCOPHAGE-XR) 24 hr tablet 500 mg  500 mg Oral BID WC Clapacs, Madie Reno, MD   500 mg at 03/05/17 1839  . mometasone-formoterol (DULERA) 200-5 MCG/ACT inhaler 2 puff  2 puff Inhalation BID Clapacs, Madie Reno, MD   2 puff at 03/05/17 1651  . nicotine (NICODERM CQ - dosed in mg/24 hours) patch 21 mg  21 mg Transdermal Daily Clapacs, [name], MD   21 mg at 03/03/17 0900  . polyethylene glycol (MIRALAX / GLYCOLAX) packet 17 g  17 g Oral Daily Clapacs, [name], MD   17 g at 03/05/17 0800  . simvastatin (ZOCOR) tablet 40 mg  40 mg Oral QHS Clapacs, Madie Reno, MD   40 mg at 03/04/17 2106  . traZODone (DESYREL) tablet 100 mg  100 mg Oral QHS Pucilowska, Jolanta B, MD   100 mg at 03/04/17 2106  . warfarin (COUMADIN) tablet 5 mg  5 mg Oral q1800 Clapacs, Madie Reno, MD   5 mg  at 03/05/17 1651  . Warfarin - Physician Dosing Inpatient   Does not apply q1800 Clapacs, Madie Reno, MD        Lab Results:  Results for orders placed or performed during the hospital encounter of 03/02/17 (from the past 48 hour(s))  Glucose, capillary     Status: Abnormal   Collection Time: 03/03/17  9:50 PM  Result Value Ref Range   Glucose-Capillary 176 (H) 65 - 99 mg/dL  Protime-INR     Status: Abnormal   Collection Time: 03/04/17  7:00 AM  Result Value Ref Range   Prothrombin Time 17.8 (H) 11.4 - 15.2 seconds   INR 1.48   Glucose, capillary     Status: Abnormal   Collection Time: 03/04/17  7:11 AM  Result Value Ref Range   Glucose-Capillary 122 (H) 65 - 99 mg/dL  Glucose, capillary     Status: Abnormal   Collection Time: 03/04/17 11:53 AM  Result Value Ref Range   Glucose-Capillary 110 (H) 65 - 99 mg/dL   Comment 1 Document in Chart   Glucose, capillary     Status: None   Collection Time: 03/04/17  4:20 PM  Result Value Ref Range   Glucose-Capillary 90 65 - 99 mg/dL  Glucose, capillary     Status: None   Collection Time: 03/04/17  9:02 PM  Result Value Ref Range   Glucose-Capillary 95 65 - 99 mg/dL  Protime-INR     Status: Abnormal   Collection Time: 03/05/17  6:56 AM  Result Value Ref Range   Prothrombin Time 19.6 (H) 11.4 - 15.2 seconds   INR 1.67   Glucose, capillary     Status: Abnormal   Collection Time: 03/05/17  7:03 AM  Result Value Ref Range   Glucose-Capillary 125 (H) 65 - 99 mg/dL  CBC with Differential/Platelet  Status: None   Collection Time: 03/05/17 11:32 AM  Result Value Ref Range   WBC 6.5 3.8 - 10.6 K/uL   RBC 4.91 4.40 - 5.90 MIL/uL   Hemoglobin 15.7 13.0 - 18.0 g/dL   HCT 44.2 40.0 - 52.0 %   MCV 90.2 80.0 - 100.0 fL   MCH 32.0 26.0 - 34.0 pg   MCHC 35.5 32.0 - 36.0 g/dL   RDW 13.4 11.5 - 14.5 %   Platelets 226 150 - 440 K/uL   Neutrophils Relative % 61 %   Neutro Abs 4.0 1.4 - 6.5 K/uL   Lymphocytes Relative 25 %   Lymphs Abs 1.6 1.0 -  3.6 K/uL   Monocytes Relative 14 %   Monocytes Absolute 0.9 0.2 - 1.0 K/uL   Eosinophils Relative 0 %   Eosinophils Absolute 0.0 0 - 0.7 K/uL   Basophils Relative 0 %   Basophils Absolute 0.0 0 - 0.1 K/uL    Blood Alcohol level:  Lab Results  Component Value Date   ETH <5 02/26/2017   ETH <5 54/65/0354    Metabolic Disorder Labs: Lab Results  Component Value Date   HGBA1C 5.3 03/03/2017   MPG 105.41 03/03/2017   MPG 111 12/30/2016   Lab Results  Component Value Date   PROLACTIN 2.0 (L) 08/31/2016   PROLACTIN 4.8 05/22/2016   Lab Results  Component Value Date   CHOL 131 03/03/2017   TRIG 207 (H) 03/03/2017   HDL 31 (L) 03/03/2017   CHOLHDL 4.2 03/03/2017   VLDL 41 (H) 03/03/2017   LDLCALC 59 03/03/2017   LDLCALC 59 12/30/2016    Physical Findings: AIMS:  , ,  ,  ,    CIWA:    COWS:     Musculoskeletal: Strength & Muscle Tone: within normal limits Gait & Station: normal Patient leans: N/A  Psychiatric Specialty Exam: Physical Exam  Nursing note and vitals reviewed. Psychiatric: His speech is normal. His mood appears anxious. His affect is blunt. He is hyperactive. Thought content is paranoid and delusional. Cognition and memory are normal. He expresses impulsivity.    Review of Systems  Constitutional: Negative.   HENT: Negative.   Eyes: Negative.   Respiratory: Negative.   Cardiovascular: Negative.   Gastrointestinal: Negative.   Genitourinary: Negative.   Musculoskeletal: Negative.   Skin: Negative.   Neurological: Negative.   Endo/Heme/Allergies: Negative.   Psychiatric/Behavioral: The patient is nervous/anxious and has insomnia.     Blood pressure 101/83, pulse (!) 118, temperature 97.8 F (36.6 C), temperature source Oral, resp. rate 18, height _0  (1.778 m), weight 86.2 kg (190 lb), SpO2 100 %.Body mass index is 27.26 kg/m.  General Appearance: Disheveled  Eye Contact:  Good  Speech:  Normal Rate  Volume:  Normal  Mood:  Anxious   Affect:  Congruent  Thought Process:  Goal Directed and Descriptions of Associations: Intact  Orientation:  Full (Time, Place, and Person)  Thought Content:  Delusions and Paranoid Ideation  Suicidal Thoughts:  No  Homicidal Thoughts:  No  Memory:  Immediate;   Fair Recent;   Fair Remote;   Fair  Judgement:  Fair  Insight:  Present  Psychomotor Activity:  Normal  Concentration:  Concentration: Fair and Attention Span: Fair  Recall:  [employer] of Knowledge:  Fair  Language:  Fair  Akathisia:  No  Handed:  Right  AIMS (if indicated):     Assets:  Communication Skills Desire for Improvement Financial Resources/Insurance Housing Resilience  Social Support  ADL's:  Intact  Cognition:  WNL  Sleep:  Number of Hours: 7.45     Treatment Plan Summary: Daily contact with patient to assess and evaluate symptoms and progress in treatment and Medication management   Lance [name] is a 41 year old male with a history of schizoaffective disorder admitting for worsening of psychosis in spite of good treatment compliance.  1. Psychosis. We continue clozapine 500 mg nightly for psychosis, Wellbutrin for depression, and lithium for mood stabilization. Lithium level was therapeutic, Clozapine level 762. We added low dose Depakote for further mood stabilization. VPA level in am.   2. Anxiety. We will add low dose clonazepam.   3. Tooth abscess. He is on clindamycin.  4. Diabetes. He is on Lantus and metformin, ADA diet and blood glucose monitoring. Blood glocose is not elevated. We will discontinue testing.  5. Dyslipidemia. He is on Zocor.  6. Constipation. He is on bowel regimen.  7. History of pulmonary embolism. He is on Coumadin per pharmacy dosing.  8. Insomnia. He is on trazodone.  9. Smoking. Nicotine patch is available.  10. COPD. He is on inhalers.  11. Disposition. He will be discharged back to his group home. He will follow up with [facility] act  team.  Orson Slick, MD 03/05/2017, 7:45 PM

## 2017-03-05 NOTE — [facility] Group Notes (Signed)
LCSW Group Therapy Note  03/05/2017 1:15pm  Type of Therapy and Topic:  Group Therapy:  Feelings around Relapse and Recovery  Participation Level:  Did Not Attend   Description of Group:    Patients in this group will discuss emotions they experience before and after a relapse. They will process how experiencing these feelings, or avoidance of experiencing them, relates to having a relapse. Facilitator will guide patients to explore emotions they have related to recovery. Patients will be encouraged to process which emotions are more powerful. They will be guided to discuss the emotional reaction significant others in their lives may have to their relapse or recovery. Patients will be assisted in exploring ways to respond to the emotions of others without this contributing to a relapse.  Therapeutic Goals: 1. Patient will identify two or more emotions that lead to a relapse for them 2. Patient will identify two emotions that result when they relapse 3. Patient will identify two emotions related to recovery 4. Patient will demonstrate ability to communicate their needs through discussion and/or role plays   Summary of Patient Progress:     Therapeutic Modalities:   Cognitive Behavioral Therapy Solution-Focused Therapy Assertiveness Training Relapse Prevention Therapy   Sallee Lange, LCSW 03/05/2017 2:56 PM

## 2017-03-05 NOTE — Progress Notes (Signed)
Flat affect. Isolates to room and pacing the hall of the unit alone Minimal interaction with staff and  peers on unit . Compliant with ADL'S and personal chores.  Appetite good and voice no concerns around sleep. Denies SI, HI, AVH. Attending unit programing goes in for a short period then comes out .  Encouragement and support offered. Safety checks maintained. Pt receptive and remains safe on unit with q 15 min checks.

## 2017-03-05 NOTE — Social Work (Signed)
Tiffany RN from [facility] ACT team will visit patient on unit 9/21 approx 1 PM.  Santa Genera, LCSW Lead Clinical Social Worker Phone:  [phone]

## 2017-03-05 NOTE — [facility] Group Notes (Signed)
[facility] Group Notes:  (Nursing/MHT/Case Management/Adjunct)  Date:  03/05/2017  Time:  9:53 AM  Type of Therapy:  Psychoeducational Skills  Participation Level:  Did Not Attend  Twanna Hy 03/05/2017, 9:53 AM

## 2017-03-05 NOTE — Plan of Care (Signed)
Problem: Activity: Goal: Sleeping patterns will improve Outcome: Progressing Patient slept for Estimated Hours of 7.45; Precautionary checks every 15 minutes for safety maintained, room free of safety hazards, patient sustains no injury or falls during this shift.     

## 2017-03-05 NOTE — [facility] Group Notes (Signed)
[facility] Group Notes:  (Nursing/MHT/Case Management/Adjunct)  Date:  03/05/2017  Time:  12:09 AM  Type of Therapy:  Evening Wrap-up Group  Participation Level:  Minimal  Participation Quality:  Attentive  Affect:  Appropriate  Cognitive:  Appropriate  Insight:  Appropriate  Engagement in Group:  Developing/Improving  Modes of Intervention:  Discussion  Summary of Progress/Problems:  Lance [name] 03/05/2017, 12:09 AM

## 2017-03-06 LAB — PROTIME-INR
INR: 1.84
PROTHROMBIN TIME: 21.1 s — AB (ref 11.4–15.2)

## 2017-03-06 NOTE — Progress Notes (Signed)
Pharmacy Consult for Clozapine Monitoring  Patient ordered  clozapine at bedtime.  9/14  ANC= 4.2 9/21 ANC = 4  Labs submitted to Clozapine [facility] program and patient eligible to receive clozapine. Check labs weekly while inpatient. Next lab on 9/28.  Cindi Carbon, PharmD, BCPS Clinical Pharmacist 03/06/2017

## 2017-03-06 NOTE — Progress Notes (Signed)
[facility] ([facility] MD Progress Note  03/06/2017 2:06 PM XAN INGRAHAM  MRN:  [id number]  Subjective:  History of present illness. Information was obtained from the patient and the chart. Mrs. Celaya came to the emergency room complaining of increased of irritation, getting bad feelings about other people, and feeling that he is about to lash out. He did not acted out on these feelings but they are very ego-dystonic. The patient is not a violent person. He decided to come to the hospital. The patient denies any symptoms of depression, anxiety or psychosis. He has been stable of on these medications Clozaril, Wellbutrin, and lithium. This is confirmed by therapeutic lithium level. She does not use alcohol or illicit substances. The patient reports that recently she moved to a new group home. It is cleaner, newer, and nicer than the old one. The food is good. Patient was somewhat anxious about the location. He has his room there. Change of group homes was recommended by his act team as well as his guardian.   03/03/2017. Mr. Gildersleeve met with treatment team today. He still feels on the edge and his "head is not straight". He denies thoughts of hurting himself. He denies intention to hurt others but feels uneasy around people. There is no history of violence. Dejohn has been out of his room today somewhat restless walking around the unit. He slept well with Trazodone. He accepts medications and tolerates them well. He reports no side effects from Depakote we started yesterday. Due to the history of PE we do not want to introduce another antipsychotic. We are checking Clozapine level. His hygiene is poor. This is uncharacteristic.   03/04/2017. Mr. Gittleman reports no improvement. He still feels on the edge. He denies thoughts of hurting other people. He has questions about addition of Depakote. He was explained again that we try to avoid medication that would increase his risk for PE. There are no somatic complaints. He looks unkept.  Sleep and appetite are fair.   03/05/2017. Mr. Bless still feels on the edge and has passing thoughts of losing control. He is annoyed by some of his peers but denies wanting to hurt anybody. He looks a little better groomed today. He still has been pacing the hallways. He came to the office and had a recent conversation with me. He noticed that today's the first day of fall. He didn't go outside briefly. He is asking for something to curb his anxiety. He tolerated Depakote well.  Follow-up for Saturday the 22nd. Patient was in bed late today and says he is feeling tired. Says he is still feeling down. Occasional hallucinations. Denies active intent to kill himself but still feels somewhat hopeless. No new behavior problems. Intermittent ability to care for his activities of daily living. Patient states he has been started on new medicine and is optimistic about it. Valproic acid level was ordered to be drawn today but for unknown reasons this order was not carried out.  Per nursing: D:Affect flat on approach. No cheerfulness noted in face . Isolates to room and pacing the hall of the unit alone  Limited eye contact . Limited interaction with peers on unit . Compliant with ADL'S and personal chores , washing his clothes.  Appetite good and voice no concerns around sleep Patient  Denies auditory hallucinations  And suicidally. Attending unit programing goes in for a short period then comes out .  A: Encourage patient participation with unit programming . Instruction  Given on  Medication ,  verbalize understanding. R: Voice no other concerns. Staff continue to monitor  Principal Problem: Schizoaffective disorder, bipolar type ([place]) Diagnosis:   Patient Active Problem List   Diagnosis Date Noted  . Tobacco use disorder [F17.200] 09/01/2016  . Schizoaffective disorder, bipolar type ([place]) [F25.0] 05/22/2016  . Diabetes mellitus without complication ([place]) [S49.6] 05/21/2016  . Hyperlipidemia [E78.5]  05/21/2016  . History of pulmonary embolism [Z86.711] 05/21/2016  . Chronic anticoagulation [Z79.01] 05/21/2016  . Hypertension [I10] 09/25/2015   Total Time spent with patient: 30 minutes  Past Psychiatric History: Long history of schizophrenia or schizoaffective disorder with multiple hospitalizations and medication trials and a history of PE on Risperdal. He is more stable on Clozapine. He is in the care of [facility] ACT team.   Past Medical History:  Past Medical History:  Diagnosis Date  . Depression   . Diabetes mellitus without complication ([place])   . Hyperlipidemia   . Hypertension   . Lupus anticoagulant disorder ([place]) 06/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  . PE (pulmonary thromboembolism) ([place])   . Schizo affective schizophrenia ([place])   . Supratherapeutic INR 11/19/2016   History reviewed. No pertinent surgical history. Family History:  Family History  Problem Relation Age of Onset  . CAD Mother   . CAD Sister    Family Psychiatric  History: see H&P. Social History:  History  Alcohol Use No    Comment: occassionally     History  Drug Use  . Types: Marijuana    Comment: Last use 11/29/16    Social History   Social History  . Marital status: Single    Spouse name: N/A  . Number of children: N/A  . Years of education: N/A   Social History Main Topics  . Smoking status: Current Every Day Smoker    Packs/day: 1.00    Years: 23.00    Types: Cigarettes  . Smokeless tobacco: Never Used  . Alcohol use No     Comment: occassionally  . Drug use: Yes    Types: Marijuana     Comment: Last use 11/29/16  . Sexual activity: No     Comment: occasional marijuana- none recently   Other Topics Concern  . None   Social History Narrative   From a group home in [place]   Additional Social History: The patient is an incompetent adult and [place] [employer] is the guardian. He just relocated to a new group home a week or so ago. He may return there.                          Sleep: Fair  Appetite:  Fair  Current Medications: Current Facility-Administered Medications  Medication Dose Route Frequency Provider Last Rate Last Dose  . acetaminophen (TYLENOL) tablet 650 mg  650 mg Oral Q6H PRN Clapacs, [name], MD      . albuterol (PROVENTIL HFA;VENTOLIN HFA) 108 (90 Base) MCG/ACT inhaler 1-2 puff  1-2 puff Inhalation Q4H PRN Clapacs, Madie Reno, MD   2 puff at 03/03/17 2127  . alum & mag hydroxide-simeth (MAALOX/MYLANTA) 200-200-20 MG/5ML suspension 30 mL  30 mL Oral Q4H PRN Clapacs, [name], MD      . buPROPion (WELLBUTRIN XL) 24 hr tablet 150 mg  150 mg Oral Daily Pucilowska, Jolanta B, MD   150 mg at 03/06/17 0913  . clonazePAM (KLONOPIN) tablet 1 mg  1 mg Oral BID Pucilowska, Jolanta B, MD   1  mg at 03/06/17 0914  . cloZAPine (CLOZARIL) tablet 500 mg  500 mg Oral QHS Clapacs, Madie Reno, MD   500 mg at 03/05/17 2145  . divalproex (DEPAKOTE) DR tablet 500 mg  500 mg Oral Q12H Pucilowska, Jolanta B, MD   500 mg at 03/06/17 0914  . docusate sodium (COLACE) capsule 200 mg  200 mg Oral BID Clapacs, Madie Reno, MD   200 mg at 03/06/17 0914  . ibuprofen (ADVIL,MOTRIN) tablet 600 mg  600 mg Oral Q8H PRN Clapacs, [name], MD      . insulin detemir (LEVEMIR) injection 14 Units  14 Units Subcutaneous BH-[id number] Clapacs, Madie Reno, MD   14 Units at 03/06/17 1034  . ipratropium (ATROVENT) 0.06 % nasal spray 1 spray  1 spray Each Nare QHS Clapacs, Madie Reno, MD   1 spray at 03/03/17 2134  . lithium carbonate (ESKALITH) CR tablet 450 mg  450 mg Oral Q12H Clapacs, Madie Reno, MD   450 mg at 03/06/17 0914  . magnesium hydroxide (MILK OF MAGNESIA) suspension 30 mL  30 mL Oral Daily PRN Clapacs, [name], MD      . metFORMIN (GLUCOPHAGE-XR) 24 hr tablet 500 mg  500 mg Oral BID WC Clapacs, Madie Reno, MD   500 mg at 03/06/17 0919  . mometasone-formoterol (DULERA) 200-5 MCG/ACT inhaler 2 puff  2 puff Inhalation BID Clapacs, [name], MD   2 puff at 03/06/17 0919  . nicotine (NICODERM CQ - dosed  in mg/24 hours) patch 21 mg  21 mg Transdermal Daily Clapacs, [name], MD   21 mg at 03/03/17 0900  . polyethylene glycol (MIRALAX / GLYCOLAX) packet 17 g  17 g Oral Daily Clapacs, [name], MD   17 g at 03/05/17 0800  . simvastatin (ZOCOR) tablet 40 mg  40 mg Oral QHS Clapacs, Madie Reno, MD   40 mg at 03/05/17 2146  . traZODone (DESYREL) tablet 100 mg  100 mg Oral QHS Pucilowska, Jolanta B, MD   100 mg at 03/05/17 2153  . warfarin (COUMADIN) tablet 5 mg  5 mg Oral q1800 Clapacs, Madie Reno, MD   5 mg at 03/05/17 1651  . Warfarin - Physician Dosing Inpatient   Does not apply q1800 Clapacs, Madie Reno, MD        Lab Results:  Results for orders placed or performed during the hospital encounter of 03/02/17 (from the past 48 hour(s))  Glucose, capillary     Status: None   Collection Time: 03/04/17  4:20 PM  Result Value Ref Range   Glucose-Capillary 90 65 - 99 mg/dL  Glucose, capillary     Status: None   Collection Time: 03/04/17  9:02 PM  Result Value Ref Range   Glucose-Capillary 95 65 - 99 mg/dL  Protime-INR     Status: Abnormal   Collection Time: 03/05/17  6:56 AM  Result Value Ref Range   Prothrombin Time 19.6 (H) 11.4 - 15.2 seconds   INR 1.67   Glucose, capillary     Status: Abnormal   Collection Time: 03/05/17  7:03 AM  Result Value Ref Range   Glucose-Capillary 125 (H) 65 - 99 mg/dL  CBC with Differential/Platelet     Status: None   Collection Time: 03/05/17 11:32 AM  Result Value Ref Range   WBC 6.5 3.8 - 10.6 K/uL   RBC 4.91 4.40 - 5.90 MIL/uL   Hemoglobin 15.7 13.0 - 18.0 g/dL   HCT 44.2 40.0 - 52.0 %   MCV  90.2 80.0 - 100.0 fL   MCH 32.0 26.0 - 34.0 pg   MCHC 35.5 32.0 - 36.0 g/dL   RDW 13.4 11.5 - 14.5 %   Platelets 226 150 - 440 K/uL   Neutrophils Relative % 61 %   Neutro Abs 4.0 1.4 - 6.5 K/uL   Lymphocytes Relative 25 %   Lymphs Abs 1.6 1.0 - 3.6 K/uL   Monocytes Relative 14 %   Monocytes Absolute 0.9 0.2 - 1.0 K/uL   Eosinophils Relative 0 %   Eosinophils Absolute 0.0 0  - 0.7 K/uL   Basophils Relative 0 %   Basophils Absolute 0.0 0 - 0.1 K/uL    Blood Alcohol level:  Lab Results  Component Value Date   ETH <5 02/26/2017   ETH <5 40/98/1191    Metabolic Disorder Labs: Lab Results  Component Value Date   HGBA1C 5.3 03/03/2017   MPG 105.41 03/03/2017   MPG 111 12/30/2016   Lab Results  Component Value Date   PROLACTIN 2.0 (L) 08/31/2016   PROLACTIN 4.8 05/22/2016   Lab Results  Component Value Date   CHOL 131 03/03/2017   TRIG 207 (H) 03/03/2017   HDL 31 (L) 03/03/2017   CHOLHDL 4.2 03/03/2017   VLDL 41 (H) 03/03/2017   LDLCALC 59 03/03/2017   LDLCALC 59 12/30/2016    Physical Findings: AIMS:  , ,  ,  ,    CIWA:    COWS:     Musculoskeletal: Strength & Muscle Tone: within normal limits Gait & Station: normal Patient leans: N/A  Psychiatric Specialty Exam: Physical Exam  Nursing note and vitals reviewed. Constitutional: He appears well-developed and well-nourished.  HENT:  Head: Normocephalic and atraumatic.  Eyes: Pupils are equal, round, and reactive to light. Conjunctivae are normal.  Neck: Normal range of motion.  Cardiovascular: Normal heart sounds.   Respiratory: Effort normal. No respiratory distress.  GI: Soft.  Musculoskeletal: Normal range of motion.  Neurological: He is alert.  Skin: Skin is warm and dry.  Psychiatric: His speech is normal. His mood appears anxious. His affect is blunt. He is hyperactive. Thought content is paranoid and delusional. Cognition and memory are normal. He expresses impulsivity.    Review of Systems  Constitutional: Negative.   HENT: Negative.   Eyes: Negative.   Respiratory: Negative.   Cardiovascular: Negative.   Gastrointestinal: Negative.   Genitourinary: Negative.   Musculoskeletal: Negative.   Skin: Negative.   Neurological: Negative.   Endo/Heme/Allergies: Negative.   Psychiatric/Behavioral: Positive for depression. The patient is nervous/anxious. The patient does not  have insomnia.     Blood pressure 136/82, pulse 75, temperature 97.7 F (36.5 C), temperature source Oral, resp. rate 18, height '5\' 10"'  (1.778 m), weight 86.2 kg (190 lb), SpO2 100 %.Body mass index is 27.26 kg/m.  General Appearance: Disheveled  Eye Contact:  Good  Speech:  Normal Rate  Volume:  Normal  Mood:  Anxious  Affect:  Congruent  Thought Process:  Goal Directed and Descriptions of Associations: Intact  Orientation:  Full (Time, Place, and Person)  Thought Content:  Delusions and Paranoid Ideation  Suicidal Thoughts:  No  Homicidal Thoughts:  No  Memory:  Immediate;   Fair Recent;   Fair Remote;   Fair  Judgement:  Fair  Insight:  Present  Psychomotor Activity:  Normal  Concentration:  Concentration: Fair and Attention Span: Fair  Recall:  [employer] of Knowledge:  Fair  Language:  Fair  Akathisia:  No  Handed:  Right  AIMS (if indicated):     Assets:  Communication Skills Desire for Improvement Financial Resources/Insurance Housing Resilience Social Support  ADL's:  Intact  Cognition:  WNL  Sleep:  Number of Hours: 6.5     Treatment Plan Summary: Daily contact with patient to assess and evaluate symptoms and progress in treatment and Medication management   Mr. Ternes is a 41 year old male with a history of schizoaffective disorder admitting for worsening of psychosis in spite of good treatment compliance.  1. Psychosis. We continue clozapine 500 mg nightly for psychosis, Wellbutrin for depression, and lithium for mood stabilization. Lithium level was therapeutic, Clozapine level 762. We added low dose Depakote for further mood stabilization. VPA level in am.   2. Anxiety. We will add low dose clonazepam.   3. Tooth abscess. He is on clindamycin.  4. Diabetes. He is on Lantus and metformin, ADA diet and blood glucose monitoring. Blood glocose is not elevated. We will discontinue testing.  5. Dyslipidemia. He is on Zocor.  6. Constipation. He is  on bowel regimen.  7. History of pulmonary embolism. He is on Coumadin per pharmacy dosing.  8. Insomnia. He is on trazodone.  9. Smoking. Nicotine patch is available.  10. COPD. He is on inhalers.  11. Disposition. He will be discharged back to his group home. He will follow up with [facility] act team.  Continue close pain as well as antidepressants. Depakote has been added. Level was not checked this morning so I have rewritten the order for tomorrow morning. Encouragement to the patient to be more active.  Alethia Berthold, MD 03/06/2017, 2:06 PM

## 2017-03-06 NOTE — Plan of Care (Signed)
Problem: Coping: Goal: Ability to cope will improve Outcome: Progressing Patient is receptive of the informations given.  Problem: Health Behavior/Discharge Planning: Goal: Identification of resources available to assist in meeting health care needs will improve Outcome: Not Progressing Patient verbalized anxiety about his group home stay after discharge.  Problem: Medication: Goal: Compliance with prescribed medication regimen will improve Outcome: Progressing Patient compliant with medications.

## 2017-03-06 NOTE — Plan of Care (Signed)
Problem: Education: Goal: Ability to make informed decisions regarding treatment will improve Outcome: Not Progressing During assessment patient was questioned on his goals for treatment and discharge. Patient stated he would do "whatever they want or decide I need to do". Patient encouraged to be proactive in developing a discharge plan for himself. This concept will need reinforcement by staff. Patient somewhat guarded, apathetic and short during assessment.   Problem: Health Behavior/Discharge Planning: Goal: Identification of resources available to assist in meeting health care needs will improve Outcome: Not Progressing Patient unable to identify any current or community resources to assist him in treatment or post hospitalization.   Problem: Medication: Goal: Compliance with prescribed medication regimen will improve Outcome: Progressing Patient was compliant with all HS medications.   Problem: Activity: Goal: Sleeping patterns will improve Outcome: Progressing Patient is currently in bed with his eyes closed and appears to be resting with no disturbances. Will continue to monitor.

## 2017-03-06 NOTE — [facility] Group Notes (Signed)
[facility] Group Notes:  (Nursing/MHT/Case Management/Adjunct)  Date:  03/06/2017  Time:  12:55 AM  Type of Therapy:  Psychoeducational Skills  Participation Level:  Active  Participation Quality:  Appropriate, Attentive and Sharing  Affect:  Appropriate  Cognitive:  Appropriate  Insight:  Appropriate and Good  Engagement in Group:  Engaged  Modes of Intervention:  Discussion, Socialization and Support  Summary of Progress/Problems:  Lance [name] 03/06/2017, 12:55 AM

## 2017-03-06 NOTE — Progress Notes (Signed)
Patient ID: Lance [name], male   DOB: 1975-11-05, 41 y.o.   MRN: [id number] LCSW Group Therapy Note  03/06/2017 1:00pm  Type of Therapy and Topic:  Group Therapy:  Cognitive Distortions  Participation Level:  Did Not Attend   Description of Group:    Patients in this group will be introduced to the topic of cognitive distortions.  Patients will identify and describe cognitive distortions, describe the feelings these distortions create for them.  Patients will identify one or more situations in their personal life where they have cognitively distorted thinking and will verbalize challenging this cognitive distortion through positive thinking skills.  Patients will practice the skill of using positive affirmations to challenge cognitive distortions using affirmation cards.    Therapeutic Goals:  1. Patient will identify two or more cognitive distortions they have used 2. Patient will identify one or more emotions that stem from use of a cognitive distortion 3. Patient will demonstrate use of a positive affirmation to counter a cognitive distortion through discussion and/or role play. 4. Patient will describe one way cognitive distortions can be detrimental to wellness   Summary of Patient Progress:     Therapeutic Modalities:   Cognitive Behavioral Therapy Motivational Interviewing   Glennon Mac, LCSW 03/06/2017 3:42 PM

## 2017-03-06 NOTE — Progress Notes (Signed)
Patient is out in the milieu this afternoon.Patient states "I could not control myself at the group home kind of anxious about going back to the same place."Denies suicidal or homicidal ideations and AV hallucinations.Minimal interactions with peers.Compliant with medications.Did not attend groups.Appetite and energy level good.Support and encouragement given.

## 2017-03-07 LAB — PROTIME-INR
INR: 1.93
Prothrombin Time: 21.9 seconds — ABNORMAL HIGH (ref 11.4–15.2)

## 2017-03-07 LAB — VALPROIC ACID LEVEL: Valproic Acid Lvl: 64 ug/mL (ref 50.0–100.0)

## 2017-03-07 NOTE — Progress Notes (Signed)
[facility] MD Progress Note  03/07/2017 2:16 PM Kingstyn Deruiter  MRN:  [id number]  Subjective:  History of present illness. Information was obtained from the patient and the chart. Mrs. Matlack came to the emergency room complaining of increased of irritation, getting bad feelings about other people, and feeling that he is about to lash out. He did not acted out on these feelings but they are very ego-dystonic. The patient is not a violent person. He decided to come to the hospital. The patient denies any symptoms of depression, anxiety or psychosis. He has been stable of on these medications Clozaril, Wellbutrin, and lithium. This is confirmed by therapeutic lithium level. She does not use alcohol or illicit substances. The patient reports that recently she moved to a new group home. It is cleaner, newer, and nicer than the old one. The food is good. Patient was somewhat anxious about the location. He has his room there. Change of group homes was recommended by his act team as well as his guardian.   03/03/2017. Mr. Hargadon met with treatment team today. He still feels on the edge and his "head is not straight". He denies thoughts of hurting himself. He denies intention to hurt others but feels uneasy around people. There is no history of violence. Julyan has been out of his room today somewhat restless walking around the unit. He slept well with Trazodone. He accepts medications and tolerates them well. He reports no side effects from Depakote we started yesterday. Due to the history of PE we do not want to introduce another antipsychotic. We are checking Clozapine level. His hygiene is poor. This is uncharacteristic.   03/04/2017. Mr. Saville reports no improvement. He still feels on the edge. He denies thoughts of hurting other people. He has questions about addition of Depakote. He was explained again that we try to avoid medication that would increase his risk for PE. There are no somatic complaints. He looks  unkept. Sleep and appetite are fair.   03/05/2017. Mr. Dragan still feels on the edge and has passing thoughts of losing control. He is annoyed by some of his peers but denies wanting to hurt anybody. He looks a little better groomed today. He still has been pacing the hallways. He came to the office and had a recent conversation with me. He noticed that today's the first day of fall. He didn't go outside briefly. He is asking for something to curb his anxiety. He tolerated Depakote well.  Follow-up for Saturday the 22nd. Patient was in bed late today and says he is feeling tired. Says he is still feeling down. Occasional hallucinations. Denies active intent to kill himself but still feels somewhat hopeless. No new behavior problems. Intermittent ability to care for his activities of daily living. Patient states he has been started on new medicine and is optimistic about it. Valproic acid level was ordered to be drawn today but for unknown reasons this order was not carried out.  Follow-up for Sunday the 23rd. Patient seen. Late in the morning he is once again in bed sound asleep. On waking up he says he is feeling very tired. Patient is not on significantly more medicine than before. He has started Depakote again. He is not however delirious. Says he is still having hallucinations" client's to discuss suicidal ideation.  Per nursing: D:Affect flat on approach. No cheerfulness noted in face . Isolates to room and pacing the hall of the unit alone  Limited eye contact . Limited interaction  with peers on unit . Compliant with ADL'S and personal chores , washing his clothes.  Appetite good and voice no concerns around sleep Patient  Denies auditory hallucinations  And suicidally. Attending unit programing goes in for a short period then comes out .  A: Encourage patient participation with unit programming . Instruction  Given on  Medication , verbalize understanding. R: Voice no other concerns. Staff continue  to monitor  Principal Problem: Schizoaffective disorder, bipolar type ([place]) Diagnosis:   Patient Active Problem List   Diagnosis Date Noted  . Tobacco use disorder [F17.200] 09/01/2016  . Schizoaffective disorder, bipolar type ([place]) [F25.0] 05/22/2016  . Diabetes mellitus without complication ([place]) [X38.1] 05/21/2016  . Hyperlipidemia [E78.5] 05/21/2016  . History of pulmonary embolism [Z86.711] 05/21/2016  . Chronic anticoagulation [Z79.01] 05/21/2016  . Hypertension [I10] 09/25/2015   Total Time spent with patient: 30 minutes  Past Psychiatric History: Long history of schizophrenia or schizoaffective disorder with multiple hospitalizations and medication trials and a history of PE on Risperdal. He is more stable on Clozapine. He is in the care of [facility] ACT team.   Past Medical History:  Past Medical History:  Diagnosis Date  . Depression   . Diabetes mellitus without complication ([place])   . Hyperlipidemia   . Hypertension   . Lupus anticoagulant disorder ([place]) 06/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  . PE (pulmonary thromboembolism) ([place])   . Schizo affective schizophrenia ([place])   . Supratherapeutic INR 11/19/2016   History reviewed. No pertinent surgical history. Family History:  Family History  Problem Relation Age of Onset  . CAD Mother   . CAD Sister    Family Psychiatric  History: see H&P. Social History:  History  Alcohol Use No    Comment: occassionally     History  Drug Use  . Types: Marijuana    Comment: Last use 11/29/16    Social History   Social History  . Marital status: Single    Spouse name: N/A  . Number of children: N/A  . Years of education: N/A   Social History Main Topics  . Smoking status: Current Every Day Smoker    Packs/day: 1.00    Years: 23.00    Types: Cigarettes  . Smokeless tobacco: Never Used  . Alcohol use No     Comment: occassionally  . Drug use: Yes    Types: Marijuana     Comment: Last use 11/29/16  .  Sexual activity: No     Comment: occasional marijuana- none recently   Other Topics Concern  . None   Social History Narrative   From a group home in [place]   Additional Social History: The patient is an incompetent adult and [place] [employer] is the guardian. He just relocated to a new group home a week or so ago. He may return there.                         Sleep: Fair  Appetite:  Fair  Current Medications: Current Facility-Administered Medications  Medication Dose Route Frequency Provider Last Rate Last Dose  . acetaminophen (TYLENOL) tablet 650 mg  650 mg Oral Q6H PRN Orlin Kann T, MD      . albuterol (PROVENTIL HFA;VENTOLIN HFA) 108 (90 Base) MCG/ACT inhaler 1-2 puff  1-2 puff Inhalation Q4H PRN Tashea Othman, Madie Reno, MD   2 puff at 03/03/17 2127  . alum & mag hydroxide-simeth (MAALOX/MYLANTA) 200-200-20 MG/5ML suspension  30 mL  30 mL Oral Q4H PRN Quanesha Klimaszewski T, MD      . buPROPion (WELLBUTRIN XL) 24 hr tablet 150 mg  150 mg Oral Daily Pucilowska, Jolanta B, MD   150 mg at 03/07/17 0751  . clonazePAM (KLONOPIN) tablet 1 mg  1 mg Oral BID Pucilowska, Jolanta B, MD   1 mg at 03/07/17 0751  . cloZAPine (CLOZARIL) tablet 500 mg  500 mg Oral QHS Miking Usrey, Madie Reno, MD   500 mg at 03/06/17 2141  . divalproex (DEPAKOTE) DR tablet 500 mg  500 mg Oral Q12H Pucilowska, Jolanta B, MD   500 mg at 03/07/17 0751  . docusate sodium (COLACE) capsule 200 mg  200 mg Oral BID Ranika Mcniel, Madie Reno, MD   200 mg at 03/07/17 0751  . ibuprofen (ADVIL,MOTRIN) tablet 600 mg  600 mg Oral Q8H PRN Khyla Mccumbers T, MD      . insulin detemir (LEVEMIR) injection 14 Units  14 Units Subcutaneous Honor Junes, Madie Reno, MD   14 Units at 03/07/17 0750  . ipratropium (ATROVENT) 0.06 % nasal spray 1 spray  1 spray Each Nare QHS Meilah Delrosario, Madie Reno, MD   1 spray at 03/03/17 2134  . lithium carbonate (ESKALITH) CR tablet 450 mg  450 mg Oral Q12H Abeer Deskins, Madie Reno, MD   450 mg at 03/07/17 0751  . magnesium hydroxide (MILK OF  MAGNESIA) suspension 30 mL  30 mL Oral Daily PRN Yajaira Doffing T, MD      . metFORMIN (GLUCOPHAGE-XR) 24 hr tablet 500 mg  500 mg Oral BID WC Khiem Gargis, Madie Reno, MD   500 mg at 03/07/17 0751  . mometasone-formoterol (DULERA) 200-5 MCG/ACT inhaler 2 puff  2 puff Inhalation BID Neven Fina, Madie Reno, MD   2 puff at 03/07/17 0751  . nicotine (NICODERM CQ - dosed in mg/24 hours) patch 21 mg  21 mg Transdermal Daily Shaheed Schmuck T, MD   21 mg at 03/03/17 0900  . polyethylene glycol (MIRALAX / GLYCOLAX) packet 17 g  17 g Oral Daily Jaquaya Coyle, Madie Reno, MD   17 g at 03/07/17 0751  . simvastatin (ZOCOR) tablet 40 mg  40 mg Oral QHS Levaeh Vice, Madie Reno, MD   40 mg at 03/06/17 2141  . traZODone (DESYREL) tablet 100 mg  100 mg Oral QHS Pucilowska, Jolanta B, MD   100 mg at 03/06/17 2141  . warfarin (COUMADIN) tablet 5 mg  5 mg Oral q1800 Zhara Gieske, Madie Reno, MD   5 mg at 03/06/17 1711  . Warfarin - Physician Dosing Inpatient   Does not apply q1800 Zahria Ding, Madie Reno, MD        Lab Results:  Results for orders placed or performed during the hospital encounter of 03/02/17 (from the past 48 hour(s))  Protime-INR     Status: Abnormal   Collection Time: 03/06/17  4:38 PM  Result Value Ref Range   Prothrombin Time 21.1 (H) 11.4 - 15.2 seconds   INR 1.84   Protime-INR     Status: Abnormal   Collection Time: 03/07/17  7:02 AM  Result Value Ref Range   Prothrombin Time 21.9 (H) 11.4 - 15.2 seconds   INR 1.93   Valproic acid level     Status: None   Collection Time: 03/07/17  7:02 AM  Result Value Ref Range   Valproic Acid Lvl 64 50.0 - 100.0 ug/mL    Blood Alcohol level:  Lab Results  Component Value Date   ETH <5 02/26/2017  ETH <5 51/88/4166    Metabolic Disorder Labs: Lab Results  Component Value Date   HGBA1C 5.3 03/03/2017   MPG 105.41 03/03/2017   MPG 111 12/30/2016   Lab Results  Component Value Date   PROLACTIN 2.0 (L) 08/31/2016   PROLACTIN 4.8 05/22/2016   Lab Results  Component Value Date   CHOL  131 03/03/2017   TRIG 207 (H) 03/03/2017   HDL 31 (L) 03/03/2017   CHOLHDL 4.2 03/03/2017   VLDL 41 (H) 03/03/2017   LDLCALC 59 03/03/2017   LDLCALC 59 12/30/2016    Physical Findings: AIMS:  , ,  ,  ,    CIWA:    COWS:     Musculoskeletal: Strength & Muscle Tone: within normal limits Gait & Station: normal Patient leans: N/A  Psychiatric Specialty Exam: Physical Exam  Nursing note and vitals reviewed. Constitutional: He appears well-developed and well-nourished.  HENT:  Head: Normocephalic and atraumatic.  Eyes: Pupils are equal, round, and reactive to light. Conjunctivae are normal.  Neck: Normal range of motion.  Cardiovascular: Normal heart sounds.   Respiratory: Effort normal. No respiratory distress.  GI: Soft.  Musculoskeletal: Normal range of motion.  Neurological: He is alert.  Skin: Skin is warm and dry.  Psychiatric: His speech is normal. His mood appears anxious. His affect is blunt. He is slowed and withdrawn. He is not hyperactive. Thought content is paranoid and delusional. Cognition and memory are normal. He expresses impulsivity.    Review of Systems  Constitutional: Positive for malaise/fatigue.  HENT: Negative.   Eyes: Negative.   Respiratory: Negative.   Cardiovascular: Negative.   Gastrointestinal: Negative.   Genitourinary: Negative.   Musculoskeletal: Negative.   Skin: Negative.   Neurological: Negative.   Endo/Heme/Allergies: Negative.   Psychiatric/Behavioral: Positive for depression. The patient is nervous/anxious. The patient does not have insomnia.     Blood pressure 136/82, pulse 75, temperature 97.7 F (36.5 C), temperature source Oral, resp. rate 18, height '5\' 10"'  (1.778 m), weight 86.2 kg (190 lb), SpO2 100 %.Body mass index is 27.26 kg/m.  General Appearance: Disheveled  Eye Contact:  Good  Speech:  Normal Rate  Volume:  Normal  Mood:  Anxious  Affect:  Congruent  Thought Process:  Goal Directed and Descriptions of  Associations: Intact  Orientation:  Full (Time, Place, and Person)  Thought Content:  Delusions and Paranoid Ideation  Suicidal Thoughts:  No  Homicidal Thoughts:  No  Memory:  Immediate;   Fair Recent;   Fair Remote;   Fair  Judgement:  Fair  Insight:  Present  Psychomotor Activity:  Normal  Concentration:  Concentration: Fair and Attention Span: Fair  Recall:  [employer] of Knowledge:  Fair  Language:  Fair  Akathisia:  No  Handed:  Right  AIMS (if indicated):     Assets:  Communication Skills Desire for Improvement Financial Resources/Insurance Housing Resilience Social Support  ADL's:  Intact  Cognition:  WNL  Sleep:  Number of Hours: 6.5     Treatment Plan Summary: Daily contact with patient to assess and evaluate symptoms and progress in treatment and Medication management   Mr. Novelo is a 41 year old male with a history of schizoaffective disorder admitting for worsening of psychosis in spite of good treatment compliance.  1. Psychosis. We continue clozapine 500 mg nightly for psychosis, Wellbutrin for depression, and lithium for mood stabilization. Lithium level was therapeutic, Clozapine level 762. We added low dose Depakote for further mood stabilization. VPA level in  am.   2. Anxiety. We will add low dose clonazepam.   3. Tooth abscess. He is on clindamycin.  4. Diabetes. He is on Lantus and metformin, ADA diet and blood glucose monitoring. Blood glocose is not elevated. We will discontinue testing.  5. Dyslipidemia. He is on Zocor.  6. Constipation. He is on bowel regimen.  7. History of pulmonary embolism. He is on Coumadin per pharmacy dosing.  8. Insomnia. He is on trazodone.  9. Smoking. Nicotine patch is available.  10. COPD. He is on inhalers.  11. Disposition. He will be discharged back to his group home. He will follow up with [facility] act team.  No change to medications. Current clozapine doses the same as what he took in the  past. I did see that his last stop Prozac level was 64. I have ordered an ammonia test to see if that could be elevated and causing him to be oversedated. Encourage patient to be active on the ward.  Alethia Berthold, MD 03/07/2017, 2:16 PM

## 2017-03-07 NOTE — Plan of Care (Signed)
Problem: Coping: Goal: Ability to cope will improve Outcome: Progressing Patient able to take care of ADLs, in no distress

## 2017-03-07 NOTE — Progress Notes (Signed)
Patient reporting difficulty sleeping with poor mood. He was very guarded with constricted affect. His appearance was disheveled. Psychosocial support provided. Patient appeared unreceptive. Sleep hygiene education will need reinforcement.

## 2017-03-07 NOTE — [facility] Group Notes (Signed)
[facility] LCSW Group Therapy 03/07/2017 1:15pm  Type of Therapy: Group Therapy- Feelings Around Discharge & Establishing a Supportive Framework  Participation Level:  Did Not Attend  Description of Group:   What is a supportive framework? What does it look like feel like and how do I discern it from and unhealthy non-supportive network? Learn how to cope when supports are not helpful and don't support you. Discuss what to do when your family/friends are not supportive.   Therapeutic Modalities:   Cognitive Behavioral Therapy Person-Centered Therapy Motivational Interviewing   Verdene Lennert, LCSW 03/07/2017 2:08 PM

## 2017-03-07 NOTE — [facility] Suicide Risk Assessment (Signed)
[facility] INPATIENT:  Family/Significant Other Suicide Prevention Education  Suicide Prevention Education:  Patient Discharged to Other Healthcare Facility:  Suicide Prevention Education Not Provided: {PT. DISCHARGED TO OTHER HEALTHCARE FACILITY:SUICIDE PREVENTION EDUCATION NOT PROVIDED (CH[facility]):  The patient is discharging to another healthcare facility for continuation of treatment.  The patient's medical information, including suicide ideations and risk factors, are a part of the medical information shared with the receiving healthcare facility.  Pt returning to group home; ACT Team and [facility] are aware of admission.   Verdene Lennert 03/07/2017, 9:41 AM

## 2017-03-07 NOTE — Progress Notes (Signed)
Patient irritable this morning reports tired of being stuck with needles and them taking blood. Pt denies SI, HI, AVH. Isolates to self and room. Comes out for meals. Minimal interaction with staff or peers. Encouragement and support offered. Safety checks maintained. Pat receptive and remains safe on unit with q 15 min checks.

## 2017-03-08 LAB — GLUCOSE, CAPILLARY
Glucose-Capillary: 102 mg/dL — ABNORMAL HIGH (ref 65–99)
Glucose-Capillary: 128 mg/dL — ABNORMAL HIGH (ref 65–99)

## 2017-03-08 LAB — LITHIUM LEVEL: LITHIUM LVL: 0.8 mmol/L (ref 0.60–1.20)

## 2017-03-08 LAB — PROTIME-INR
INR: 2.16
Prothrombin Time: 23.9 seconds — ABNORMAL HIGH (ref 11.4–15.2)

## 2017-03-08 LAB — AMMONIA: AMMONIA: 86 umol/L — AB (ref 9–35)

## 2017-03-08 MED ORDER — CLONAZEPAM 0.5 MG PO TABS
0.5000 mg | ORAL_TABLET | Freq: Two times a day (BID) | ORAL | Status: DC
  Administered 2017-03-08 – 2017-03-11 (×6): 0.5 mg via ORAL
  Filled 2017-03-08 (×6): qty 1

## 2017-03-08 NOTE — Progress Notes (Signed)
D: Patient appears disheveled and unkept.  His hygiene is poor.  He is lethargic and drowsy and hard to arouse this morning.  Patient tends to isolate to his room and sleep.  He did get up to take a shower.  He denies any thoughts of self harm or auditory hallucinations.  His affect is flat and blunted; his mood is sullen and sad.  Patient has some irritability, however, is cooperative. A: Continue to monitor medication management and MD orders.  Safety checks continued every 15 minutes per protocol.  Offer support and encouragement as needed. R: Patient is receptive to staff; his behavior is appropriate.

## 2017-03-08 NOTE — Progress Notes (Signed)
D: Pt denies SI/HI/AVH. Pt is pleasant and cooperative. Pt stated he was doing a little better. Pt affect very depressed this evening  A: Pt was offered support and encouragement. Pt was given scheduled medications. Pt was encourage to attend groups. Q 15 minute checks were done for safety.   R:Pt attends groups and interacts well with peers and staff. Pt is taking medication. Pt has no complaints.Pt receptive to treatment and safety maintained on unit.

## 2017-03-08 NOTE — Plan of Care (Signed)
Problem: Safety: Goal: Ability to remain free from injury will improve Outcome: Progressing Pt safe on the unit at this time   

## 2017-03-08 NOTE — [facility] Group Notes (Signed)
[facility] LCSW Group Therapy Note  Date/Time: 03/08/17, 1300  Type of Therapy and Topic:  Group Therapy:  Overcoming Obstacles  Participation Level:  Did not attend  Description of Group:    In this group patients will be encouraged to explore what they see as obstacles to their own wellness and recovery. They will be guided to discuss their thoughts, feelings, and behaviors related to these obstacles. The group will process together ways to cope with barriers, with attention given to specific choices patients can make. Each patient will be challenged to identify changes they are motivated to make in order to overcome their obstacles. This group will be process-oriented, with patients participating in exploration of their own experiences as well as giving and receiving support and challenge from other group members.  Therapeutic Goals: 1. Patient will identify personal and current obstacles as they relate to admission. 2. Patient will identify barriers that currently interfere with their wellness or overcoming obstacles.  3. Patient will identify feelings, thought process and behaviors related to these barriers. 4. Patient will identify two changes they are willing to make to overcome these obstacles:    Summary of Patient Progress      Therapeutic Modalities:   Cognitive Behavioral Therapy Solution Focused Therapy Motivational Interviewing Relapse Prevention Therapy  Greg Modesty Rudy, LCSW 

## 2017-03-08 NOTE — Progress Notes (Signed)
[facility] MD Progress Note  03/08/2017 1:36 PM Lance [name]  MRN:  [id number]  Subjective:  History of present illness. Information was obtained from the patient and the chart. Lance [name] came to the emergency room complaining of increased of irritation, getting bad feelings about other people, and feeling that he is about to lash out. He did not acted out on these feelings but they are very ego-dystonic. The patient is not a violent person. He decided to come to the hospital. The patient denies any symptoms of depression, anxiety or psychosis. He has been stable of on these medications Clozaril, Wellbutrin, and lithium. This is confirmed by therapeutic lithium level. She does not use alcohol or illicit substances. The patient reports that recently she moved to a new group home. It is cleaner, newer, and nicer than the old one. The food is good. Patient was somewhat anxious about the location. He has his room there. Change of group homes was recommended by his act team as well as his guardian.   03/03/2017. [name] met with treatment team today. He still feels on the edge and his "head is not straight". He denies thoughts of hurting himself. He denies intention to hurt others but feels uneasy around people. There is no history of violence. Lance [name] has been out of his room today somewhat restless walking around the unit. He slept well with Trazodone. He accepts medications and tolerates them well. He reports no side effects from Depakote we started yesterday. Due to the history of PE we do not want to introduce another antipsychotic. We are checking Clozapine level. His hygiene is poor. This is uncharacteristic.   03/04/2017. [name] reports no improvement. He still feels on the edge. He denies thoughts of hurting other people. He has questions about addition of Depakote. He was explained again that we try to avoid medication that would increase his risk for PE. There are no somatic complaints. He looks  unkept. Sleep and appetite are fair.   03/05/2017. [name] still feels on the edge and has passing thoughts of losing control. He is annoyed by some of his peers but denies wanting to hurt anybody. He looks a little better groomed today. He still has been pacing the hallways. He came to the office and had a recent conversation with me. He noticed that today's the first day of fall. He didn't go outside briefly. He is asking for something to curb his anxiety. He tolerated Depakote well.  Follow-up for Saturday the 22nd. Patient was in bed late today and says he is feeling tired. Says he is still feeling down. Occasional hallucinations. Denies active intent to kill himself but still feels somewhat hopeless. No new behavior problems. Intermittent ability to care for his activities of daily living. Patient states he has been started on new medicine and is optimistic about it. Valproic acid level was ordered to be drawn today but for unknown reasons this order was not carried out.  Follow-up for Sunday the 23rd. Patient seen. Late in the morning he is once again in bed sound asleep. On waking up he says he is feeling very tired. Patient is not on significantly more medicine than before. He has started Depakote again. He is not however delirious. Says he is still having hallucinations" client's to discuss suicidal ideation.  03/08/2017. [name] feels tired. His hygiene is terrible. He seems last in thoughts interacting minimally. Complains of cold room. He is secluded to his room except for meals.  Per nursing: Patient more social with Probation officer than past evenings. He made joking remarks about being abducted by aliens in the context of not wanting to be here anymore. Unsure if he meant in the hospital or alive, however he denies suicidal ideations at this time. He was compliant with all HS medications. He was disheveled with a dirty shirt on.   Principal Problem: Schizoaffective disorder, bipolar type  ([place]) Diagnosis:   Patient Active Problem List   Diagnosis Date Noted  . Tobacco use disorder [F17.200] 09/01/2016  . Schizoaffective disorder, bipolar type ([place]) [F25.0] 05/22/2016  . Diabetes mellitus without complication ([place]) [U54.2] 05/21/2016  . Hyperlipidemia [E78.5] 05/21/2016  . History of pulmonary embolism [Z86.711] 05/21/2016  . Chronic anticoagulation [Z79.01] 05/21/2016  . Hypertension [I10] 09/25/2015   Total Time spent with patient: 30 minutes  Past Psychiatric History: Long history of schizophrenia or schizoaffective disorder with multiple hospitalizations and medication trials and a history of PE on Risperdal. He is more stable on Clozapine. He is in the care of [facility] ACT team.   Past Medical History:  Past Medical History:  Diagnosis Date  . Depression   . Diabetes mellitus without complication ([place])   . Hyperlipidemia   . Hypertension   . Lupus anticoagulant disorder ([place]) 06/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  . PE (pulmonary thromboembolism) ([place])   . Schizo affective schizophrenia ([place])   . Supratherapeutic INR 11/19/2016   History reviewed. No pertinent surgical history. Family History:  Family History  Problem Relation Age of Onset  . CAD Mother   . CAD Sister    Family Psychiatric  History: see H&P. Social History:  History  Alcohol Use No    Comment: occassionally     History  Drug Use  . Types: Marijuana    Comment: Last use 11/29/16    Social History   Social History  . Marital status: Single    Spouse name: N/A  . Number of children: N/A  . Years of education: N/A   Social History Main Topics  . Smoking status: Current Every Day Smoker    Packs/day: 1.00    Years: 23.00    Types: Cigarettes  . Smokeless tobacco: Never Used  . Alcohol use No     Comment: occassionally  . Drug use: Yes    Types: Marijuana     Comment: Last use 11/29/16  . Sexual activity: No     Comment: occasional marijuana- none recently    Other Topics Concern  . None   Social History Narrative   From a group home in [place]   Additional Social History: The patient is an incompetent adult and [place] [employer] is the guardian. He just relocated to a new group home a week or so ago. He may return there.                         Sleep: Fair  Appetite:  Fair  Current Medications: Current Facility-Administered Medications  Medication Dose Route Frequency Provider Last Rate Last Dose  . acetaminophen (TYLENOL) tablet 650 mg  650 mg Oral Q6H PRN Clapacs, [name], MD      . albuterol (PROVENTIL HFA;VENTOLIN HFA) 108 (90 Base) MCG/ACT inhaler 1-2 puff  1-2 puff Inhalation Q4H PRN Clapacs, Madie Reno, MD   2 puff at 03/03/17 2127  . alum & mag hydroxide-simeth (MAALOX/MYLANTA) 200-200-20 MG/5ML suspension 30 mL  30 mL Oral Q4H PRN Clapacs, John  T, MD      . buPROPion (WELLBUTRIN XL) 24 hr tablet 150 mg  150 mg Oral Daily Liliane Mallis B, MD   150 mg at 03/08/17 0753  . clonazePAM (KLONOPIN) tablet 1 mg  1 mg Oral BID Vandy Tsuchiya B, MD   1 mg at 03/08/17 0753  . cloZAPine (CLOZARIL) tablet 500 mg  500 mg Oral QHS Clapacs, Madie Reno, MD   500 mg at 03/07/17 2114  . divalproex (DEPAKOTE) DR tablet 500 mg  500 mg Oral Q12H Doniven Vanpatten B, MD   500 mg at 03/08/17 0753  . docusate sodium (COLACE) capsule 200 mg  200 mg Oral BID Clapacs, Madie Reno, MD   200 mg at 03/08/17 0753  . ibuprofen (ADVIL,MOTRIN) tablet 600 mg  600 mg Oral Q8H PRN Clapacs, [name], MD      . insulin detemir (LEVEMIR) injection 14 Units  14 Units Subcutaneous Honor Junes, Madie Reno, MD   14 Units at 03/08/17 [phone]  . ipratropium (ATROVENT) 0.06 % nasal spray 1 spray  1 spray Each Nare QHS Clapacs, Madie Reno, MD   1 spray at 03/03/17 2134  . lithium carbonate (ESKALITH) CR tablet 450 mg  450 mg Oral Q12H Clapacs, Madie Reno, MD   450 mg at 03/08/17 0753  . magnesium hydroxide (MILK OF MAGNESIA) suspension 30 mL  30 mL Oral Daily PRN Clapacs, [name], MD       . metFORMIN (GLUCOPHAGE-XR) 24 hr tablet 500 mg  500 mg Oral BID WC Clapacs, Madie Reno, MD   500 mg at 03/08/17 0755  . mometasone-formoterol (DULERA) 200-5 MCG/ACT inhaler 2 puff  2 puff Inhalation BID Clapacs, Madie Reno, MD   2 puff at 03/08/17 0754  . nicotine (NICODERM CQ - dosed in mg/24 hours) patch 21 mg  21 mg Transdermal Daily Clapacs, [name], MD   21 mg at 03/03/17 0900  . polyethylene glycol (MIRALAX / GLYCOLAX) packet 17 g  17 g Oral Daily Clapacs, Madie Reno, MD   17 g at 03/08/17 0754  . simvastatin (ZOCOR) tablet 40 mg  40 mg Oral QHS Clapacs, Madie Reno, MD   40 mg at 03/07/17 2113  . traZODone (DESYREL) tablet 100 mg  100 mg Oral QHS Everleigh Colclasure B, MD   100 mg at 03/07/17 2114  . warfarin (COUMADIN) tablet 5 mg  5 mg Oral q1800 Clapacs, Madie Reno, MD   5 mg at 03/07/17 1722  . Warfarin - Physician Dosing Inpatient   Does not apply q1800 Clapacs, Madie Reno, MD        Lab Results:  Results for orders placed or performed during the hospital encounter of 03/02/17 (from the past 48 hour(s))  Protime-INR     Status: Abnormal   Collection Time: 03/06/17  4:38 PM  Result Value Ref Range   Prothrombin Time 21.1 (H) 11.4 - 15.2 seconds   INR 1.84   Protime-INR     Status: Abnormal   Collection Time: 03/07/17  7:02 AM  Result Value Ref Range   Prothrombin Time 21.9 (H) 11.4 - 15.2 seconds   INR 1.93   Valproic acid level     Status: None   Collection Time: 03/07/17  7:02 AM  Result Value Ref Range   Valproic Acid Lvl 64 50.0 - 100.0 ug/mL  Glucose, capillary     Status: Abnormal   Collection Time: 03/08/17  6:46 AM  Result Value Ref Range   Glucose-Capillary 128 (H) 65 -  99 mg/dL    Blood Alcohol level:  Lab Results  Component Value Date   ETH <5 02/26/2017   ETH <5 35/46/5681    Metabolic Disorder Labs: Lab Results  Component Value Date   HGBA1C 5.3 03/03/2017   MPG 105.41 03/03/2017   MPG 111 12/30/2016   Lab Results  Component Value Date   PROLACTIN 2.0 (L) 08/31/2016    PROLACTIN 4.8 05/22/2016   Lab Results  Component Value Date   CHOL 131 03/03/2017   TRIG 207 (H) 03/03/2017   HDL 31 (L) 03/03/2017   CHOLHDL 4.2 03/03/2017   VLDL 41 (H) 03/03/2017   LDLCALC 59 03/03/2017   LDLCALC 59 12/30/2016    Physical Findings: AIMS:  , ,  ,  ,    CIWA:    COWS:     Musculoskeletal: Strength & Muscle Tone: within normal limits Gait & Station: normal Patient leans: N/A  Psychiatric Specialty Exam: Physical Exam  Nursing note and vitals reviewed. Constitutional: He appears well-developed and well-nourished.  HENT:  Head: Normocephalic and atraumatic.  Eyes: Pupils are equal, round, and reactive to light. Conjunctivae are normal.  Neck: Normal range of motion.  Cardiovascular: Normal heart sounds.   Respiratory: Effort normal. No respiratory distress.  GI: Soft.  Musculoskeletal: Normal range of motion.  Neurological: He is alert.  Skin: Skin is warm and dry.  Psychiatric: His speech is normal. His mood appears anxious. His affect is blunt. He is slowed and withdrawn. Thought content is paranoid and delusional. Cognition and memory are normal. He expresses impulsivity.    Review of Systems  Constitutional: Positive for malaise/fatigue.  HENT: Negative.   Eyes: Negative.   Respiratory: Negative.   Cardiovascular: Negative.   Gastrointestinal: Negative.   Genitourinary: Negative.   Musculoskeletal: Negative.   Skin: Negative.   Neurological: Negative.   Endo/Heme/Allergies: Negative.   Psychiatric/Behavioral: Positive for depression. The patient is nervous/anxious.     Blood pressure 136/82, pulse 75, temperature 97.7 F (36.5 C), temperature source Oral, resp. rate 18, height '5\' 10"'  (1.778 m), weight 86.2 kg (190 lb), SpO2 100 %.Body mass index is 27.26 kg/m.  General Appearance: Disheveled  Eye Contact:  Good  Speech:  Normal Rate  Volume:  Normal  Mood:  Anxious  Affect:  Congruent  Thought Process:  Goal Directed and  Descriptions of Associations: Intact  Orientation:  Full (Time, Place, and Person)  Thought Content:  Delusions and Paranoid Ideation  Suicidal Thoughts:  No  Homicidal Thoughts:  No  Memory:  Immediate;   Fair Recent;   Fair Remote;   Fair  Judgement:  Fair  Insight:  Present  Psychomotor Activity:  Normal  Concentration:  Concentration: Fair and Attention Span: Fair  Recall:  [employer] of Knowledge:  Fair  Language:  Fair  Akathisia:  No  Handed:  Right  AIMS (if indicated):     Assets:  Communication Skills Desire for Improvement Financial Resources/Insurance Housing Resilience Social Support  ADL's:  Intact  Cognition:  WNL  Sleep:  Number of Hours: 7.75     Treatment Plan Summary: Daily contact with patient to assess and evaluate symptoms and progress in treatment and Medication management   Mr. Shill is a 41 year old male with a history of schizoaffective disorder admitting for worsening of psychosis in spite of good treatment compliance.  1. Psychosis. We continue clozapine 500 mg nightly for psychosis, Wellbutrin for depression, and lithium for mood stabilization. Lithium level was therapeutic, Clozapine level 762.  We added low dose Depakote for further mood stabilization. VPA level 64. We will check ammonia due to sedation.  2. Anxiety. We will lower clonazepam to 0.5 mg bid.    3. Tooth abscess. He is on clindamycin.  4. Diabetes. He is on Lantus and metformin, ADA diet and blood glucose monitoring. Blood glocose is not elevated. We will discontinue testing.  5. Dyslipidemia. He is on Zocor.  6. Constipation. He is on bowel regimen.  7. History of pulmonary embolism. He is on Coumadin per pharmacy dosing.  8. Insomnia. He is on trazodone.  9. Smoking. Nicotine patch is available.  10. COPD. He is on inhalers.  11. Disposition. He will be discharged back to his group home. He will follow up with [facility] act team.   Orson Slick,  MD 03/08/2017, 1:36 PM

## 2017-03-08 NOTE — [facility] Group Notes (Signed)
[facility] Group Notes:  (Nursing/MHT/Case Management/Adjunct)  Date:  03/08/2017  Time:  10:10 AM  Type of Therapy:  Psychoeducational Skills  Participation Level:  Did Not Attend   Lynelle Smoke [facility] 03/08/2017, 10:10 AM

## 2017-03-08 NOTE — Progress Notes (Signed)
Patient more social with Clinical research associate than past evenings. He made joking remarks about being abducted by aliens in the context of not wanting to be here anymore. Unsure if he meant in the hospital or alive, however he denies suicidal ideations at this time. He was compliant with all HS medications. He was disheveled with a dirty shirt on.

## 2017-03-08 NOTE — Tx Team (Signed)
Interdisciplinary Treatment and Diagnostic Plan Update  03/08/2017 Time of Session: 1100am Lance [name] MRN: [id number]  Principal Diagnosis: Schizoaffective disorder, bipolar type [facility])  Secondary Diagnoses: Principal Problem:   Schizoaffective disorder, bipolar type (HCC) Active Problems:   Hypertension   Diabetes mellitus without complication (HCC)   Hyperlipidemia   History of pulmonary embolism   Chronic anticoagulation   Tobacco use disorder   Current Medications:  Current Facility-Administered Medications  Medication Dose Route Frequency Provider Last Rate Last Dose  . acetaminophen (TYLENOL) tablet 650 mg  650 mg Oral Q6H PRN Clapacs, [name], MD      . albuterol (PROVENTIL HFA;VENTOLIN HFA) 108 (90 Base) MCG/ACT inhaler 1-2 puff  1-2 puff Inhalation Q4H PRN Clapacs, [name], MD   2 puff at 03/03/17 2127  . alum & mag hydroxide-simeth (MAALOX/MYLANTA) 200-200-20 MG/5ML suspension 30 mL  30 mL Oral Q4H PRN Clapacs, [name], MD      . buPROPion (WELLBUTRIN XL) 24 hr tablet 150 mg  150 mg Oral Daily Pucilowska, Jolanta B, MD   150 mg at 03/08/17 0753  . clonazePAM (KLONOPIN) tablet 0.5 mg  0.5 mg Oral BID Pucilowska, Jolanta B, MD      . cloZAPine (CLOZARIL) tablet 500 mg  500 mg Oral QHS Clapacs, [name], MD   500 mg at 03/07/17 2114  . divalproex (DEPAKOTE) DR tablet 500 mg  500 mg Oral Q12H Pucilowska, Jolanta B, MD   500 mg at 03/08/17 0753  . docusate sodium (COLACE) capsule 200 mg  200 mg Oral BID Clapacs, [name], MD   200 mg at 03/08/17 0753  . ibuprofen (ADVIL,MOTRIN) tablet 600 mg  600 mg Oral Q8H PRN Clapacs, [name], MD      . insulin detemir (LEVEMIR) injection 14 Units  14 Units Subcutaneous Bryn Gulling, [name], MD   14 Units at 03/08/17 [phone]  . ipratropium (ATROVENT) 0.06 % nasal spray 1 spray  1 spray Each Nare QHS Clapacs, [name], MD   1 spray at 03/03/17 2134  . lithium carbonate (ESKALITH) CR tablet 450 mg  450 mg Oral Q12H Clapacs, [name], MD   450 mg at  03/08/17 0753  . magnesium hydroxide (MILK OF MAGNESIA) suspension 30 mL  30 mL Oral Daily PRN Clapacs, [name], MD      . metFORMIN (GLUCOPHAGE-XR) 24 hr tablet 500 mg  500 mg Oral BID WC Clapacs, [name], MD   500 mg at 03/08/17 0755  . mometasone-formoterol (DULERA) 200-5 MCG/ACT inhaler 2 puff  2 puff Inhalation BID Clapacs, [name], MD   2 puff at 03/08/17 0754  . nicotine (NICODERM CQ - dosed in mg/24 hours) patch 21 mg  21 mg Transdermal Daily Clapacs, [name], MD   21 mg at 03/03/17 0900  . polyethylene glycol (MIRALAX / GLYCOLAX) packet 17 g  17 g Oral Daily Clapacs, [name], MD   17 g at 03/08/17 0754  . simvastatin (ZOCOR) tablet 40 mg  40 mg Oral QHS Clapacs, [name], MD   40 mg at 03/07/17 2113  . traZODone (DESYREL) tablet 100 mg  100 mg Oral QHS Pucilowska, Jolanta B, MD   100 mg at 03/07/17 2114  . warfarin (COUMADIN) tablet 5 mg  5 mg Oral q1800 Clapacs, [name], MD   5 mg at 03/07/17 1722  . Warfarin - Physician Dosing Inpatient   Does not apply q1800 Clapacs, [name], MD       PTA  Medications: Prescriptions Prior to Admission  Medication Sig Dispense Refill Last Dose  . albuterol (PROVENTIL HFA;VENTOLIN HFA) 108 (90 Base) MCG/ACT inhaler Inhale 1-2 puffs into the lungs every 4 (four) hours as needed for wheezing or shortness of breath. 1 Inhaler 0 prn at prn  . buPROPion (WELLBUTRIN XL) 150 MG 24 hr tablet Take 1 tablet (150 mg total) by mouth daily. 30 tablet 0 unknown at unknown  . cloZAPine (CLOZARIL) 100 MG tablet Take 5 tablets (500 mg total) by mouth at bedtime. 150 tablet 0 unknown at unknown  . docusate sodium (COLACE) 100 MG capsule Take 2 capsules (200 mg total) by mouth 2 (two) times daily. 120 capsule 0 unknown at unknown  . Fluticasone-Salmeterol (ADVAIR) 250-50 MCG/DOSE AEPB Inhale 1 puff into the lungs 2 (two) times daily.   unknown at unknown  . insulin detemir (LEVEMIR) 100 UNIT/ML injection Inject 14 Units into the skin every morning.   unknown at unknown  .  ipratropium (ATROVENT) 0.06 % nasal spray Place 1 spray into both nostrils at bedtime. 3 mL 0 unknown at unknown  . lisinopril (PRINIVIL,ZESTRIL) 2.5 MG tablet Take 2.5 mg by mouth daily.   unknown at unknown  . lithium carbonate (ESKALITH) 450 MG CR tablet Take 1 tablet (450 mg total) by mouth every 12 (twelve) hours. 60 tablet 0 unknown at unknown  . metformin (FORTAMET) 500 MG (OSM) 24 hr tablet Take 500 mg by mouth 2 (two) times daily with a meal.   unknown at unknown  . mometasone-formoterol (DULERA) 200-5 MCG/ACT AERO Inhale 2 puffs into the lungs 2 (two) times daily. 1 Inhaler 1 unknown at unknown  . omega-3 acid ethyl esters (LOVAZA) 1 g capsule Take 1 g by mouth daily.   unknown at unknown  . polyethylene glycol (MIRALAX / GLYCOLAX) packet Take 17 g by mouth daily. 30 each 0 prn at prn  . simvastatin (ZOCOR) 40 MG tablet Take 40 mg by mouth at bedtime.   unknown at unknown  . traZODone (DESYREL) 50 MG tablet Take 75 mg by mouth at bedtime.   unknown at unknown  . warfarin (COUMADIN) 10 MG tablet Take 10 mg by mouth daily.   unknown at unknown  . warfarin (COUMADIN) 5 MG tablet Take 1 tablet (5 mg total) by mouth daily at 6 PM. 30 tablet 0 unknown at unknown    Patient Stressors:    Patient Strengths:    Treatment Modalities: Medication Management, Group therapy, Case management,  1 to 1 session with clinician, Psychoeducation, Recreational therapy.   Physician Treatment Plan for Primary Diagnosis: Schizoaffective disorder, bipolar type (HCC) Long Term Goal(s): Improvement in symptoms so as ready for discharge NA   Short Term Goals: Ability to identify changes in lifestyle to reduce recurrence of condition will improve Ability to verbalize feelings will improve Ability to disclose and discuss suicidal ideas Ability to demonstrate self-control will improve Ability to identify and develop effective coping behaviors will improve Ability to identify triggers associated with  substance abuse/mental health issues will improve NA  Medication Management: Evaluate patient's response, side effects, and tolerance of medication regimen.  Therapeutic Interventions: 1 to 1 sessions, Unit Group sessions and Medication administration.  Evaluation of Outcomes: Progressing  Physician Treatment Plan for Secondary Diagnosis: Principal Problem:   Schizoaffective disorder, bipolar type (HCC) Active Problems:   Hypertension   Diabetes mellitus without complication (HCC)   Hyperlipidemia   History of pulmonary embolism   Chronic anticoagulation   Tobacco use disorder  Long  Term Goal(s): Improvement in symptoms so as ready for discharge NA   Short Term Goals: Ability to identify changes in lifestyle to reduce recurrence of condition will improve Ability to verbalize feelings will improve Ability to disclose and discuss suicidal ideas Ability to demonstrate self-control will improve Ability to identify and develop effective coping behaviors will improve Ability to identify triggers associated with substance abuse/mental health issues will improve NA     Medication Management: Evaluate patient's response, side effects, and tolerance of medication regimen.  Therapeutic Interventions: 1 to 1 sessions, Unit Group sessions and Medication administration.  Evaluation of Outcomes: Progressing   RN Treatment Plan for Primary Diagnosis: Schizoaffective disorder, bipolar type (HCC) Long Term Goal(s): Knowledge of disease and therapeutic regimen to maintain health will improve  Short Term Goals: Ability to verbalize frustration and anger appropriately will improve, Ability to verbalize feelings will improve and Ability to identify and develop effective coping behaviors will improve  Medication Management: RN will administer medications as ordered by provider, will assess and evaluate patient's response and provide education to patient for prescribed medication. RN will report  any adverse and/or side effects to prescribing provider.  Therapeutic Interventions: 1 on 1 counseling sessions, Psychoeducation, Medication administration, Evaluate responses to treatment, Monitor vital signs and CBGs as ordered, Perform/monitor CIWA, COWS, AIMS and Fall Risk screenings as ordered, Perform wound care treatments as ordered.  Evaluation of Outcomes: Progressing   LCSW Treatment Plan for Primary Diagnosis: Schizoaffective disorder, bipolar type (HCC) Long Term Goal(s): Safe transition to appropriate next level of care at discharge, Engage patient in therapeutic group addressing interpersonal concerns.  Short Term Goals: Engage patient in aftercare planning with referrals and resources, Increase emotional regulation, Identify triggers associated with mental health/substance abuse issues and Increase skills for wellness and recovery  Therapeutic Interventions: Assess for all discharge needs, 1 to 1 time with Social worker, Explore available resources and support systems, Assess for adequacy in community support network, Educate family and significant other(s) on suicide prevention, Complete Psychosocial Assessment, Interpersonal group therapy.  Evaluation of Outcomes: Progressing   Progress in Treatment: Attending groups: Yes. Participating in groups: Yes. Taking medication as prescribed: Yes. Toleration medication: Yes. Family/Significant other contact made: No, will contact:    Patient understands diagnosis: Yes. Discussing patient identified problems/goals with staff: Yes. Medical problems stabilized or resolved: Yes. Denies suicidal/homicidal ideation: Yes. Issues/concerns per patient self-inventory: No. Other:    New problem(s) identified: No, Describe:     New Short Term/Long Term Goal(s):  Discharge Plan or Barriers: Pt will return to ACT team services.  Reason for Continuation of Hospitalization: Medication stabilization Other; describe Mood instability,  hallucinations  Estimated Length of Stay:5-7 days  Attendees: Patient: 03/08/2017   Physician: Dr. Jennet Maduro, MD 03/08/2017   Nursing: Donnetta Simpers, RN 03/08/2017   RN Care Manager: 03/08/2017   Social Worker: Daleen Squibb, LCSW 03/08/2017   Recreational Therapist:  03/08/2017   Other:  03/08/2017   Other:  03/08/2017   Other: 03/08/2017        Scribe for Treatment Team: Lorri Frederick, LCSW 03/08/2017 2:37 PM

## 2017-03-09 LAB — GLUCOSE, CAPILLARY
Glucose-Capillary: 99 mg/dL (ref 65–99)
Glucose-Capillary: 106 mg/dL — ABNORMAL HIGH (ref 65–99)

## 2017-03-09 LAB — PROTIME-INR
INR: 2.17
Prothrombin Time: 24 seconds — ABNORMAL HIGH (ref 11.4–15.2)

## 2017-03-09 NOTE — Plan of Care (Signed)
Problem: Education: Goal: Ability to make informed decisions regarding treatment will improve Outcome: Progressing Patient able to make uncomplicated decision   Problem: Coping: Goal: Ability to cope will improve Outcome: Progressing Working on Pharmacologist

## 2017-03-09 NOTE — [facility] Group Notes (Signed)
LCSW Group Therapy Note  03/09/2017 3pm  Type of Therapy/Topic:  Group Therapy:  Feelings about Diagnosis  Participation Level:  Active   Description of Group:   This group will allow patients to explore their thoughts and feelings about diagnoses they have received. Patients will be guided to explore their level of understanding and acceptance of these diagnoses. Facilitator will encourage patients to process their thoughts and feelings about the reactions of others to their diagnosis and will guide patients in identifying ways to discuss their diagnosis with significant others in their lives. This group will be process-oriented, with patients participating in exploration of their own experiences, giving and receiving support, and processing challenge from other group members.   Therapeutic Goals: 1. Patient will demonstrate understanding of diagnosis as evidenced by identifying two or more symptoms of the disorder 2. Patient will be able to express two feelings regarding the diagnosis 3. Patient will demonstrate their ability to communicate their needs through discussion and/or role play  Summary of Patient Progress:   Pt able to meet therapeutic goals listed above. He shared feelings of frustration and anger that he continues to face the same symptoms over and over.  He verbalizes that going to church helps him cope with feeling like he's "too far gone to be helped"    Therapeutic Modalities:   Cognitive Behavioral Therapy Brief Therapy Feelings Identification    Glennon Mac, LCSW 03/09/2017 5:05 PM

## 2017-03-09 NOTE — [facility] Group Notes (Signed)
[facility] Group Notes:  (Nursing/MHT/Case Management/Adjunct)  Date:  03/09/2017  Time:  3:07 PM  Type of Therapy:  Psychoeducational Skills  Participation Level:  Did Not Attend   Lynelle Smoke [facility] 03/09/2017, 3:07 PM

## 2017-03-09 NOTE — Progress Notes (Signed)
[facility] MD Progress Note  03/09/2017 3:04 PM Lance [name]  MRN:  [id number]  Subjective:  History of present illness. Information was obtained from the patient and the chart. Lance [name] came to the emergency room complaining of increased of irritation, getting bad feelings about other people, and feeling that he is about to lash out. He did not acted out on these feelings but they are very ego-dystonic. The patient is not a violent person. He decided to come to the hospital. The patient denies any symptoms of depression, anxiety or psychosis. He has been stable of on these medications Clozaril, Wellbutrin, and lithium. This is confirmed by therapeutic lithium level. She does not use alcohol or illicit substances. The patient reports that recently she moved to a new group home. It is cleaner, newer, and nicer than the old one. The food is good. Patient was somewhat anxious about the location. He has his room there. Change of group homes was recommended by his act team as well as his guardian.   03/03/2017. Lance [name] met with treatment team today. He still feels on the edge and his "head is not straight". He denies thoughts of hurting himself. He denies intention to hurt others but feels uneasy around people. There is no history of violence. Lance [name] has been out of his room today somewhat restless walking around the unit. He slept well with Trazodone. He accepts medications and tolerates them well. He reports no side effects from Depakote we started yesterday. Due to the history of PE we do not want to introduce another antipsychotic. We are checking Clozapine level. His hygiene is poor. This is uncharacteristic.   03/04/2017. Lance [name] reports no improvement. He still feels on the edge. He denies thoughts of hurting other people. He has questions about addition of Depakote. He was explained again that we try to avoid medication that would increase his risk for PE. There are no somatic complaints. He looks  unkept. Sleep and appetite are fair.   03/05/2017. Lance [name] still feels on the edge and has passing thoughts of losing control. He is annoyed by some of his peers but denies wanting to hurt anybody. He looks a little better groomed today. He still has been pacing the hallways. He came to the office and had a recent conversation with me. He noticed that today's the first day of fall. He didn't go outside briefly. He is asking for something to curb his anxiety. He tolerated Depakote well.  Follow-up for Saturday the 22nd. Patient was in bed late today and says he is feeling tired. Says he is still feeling down. Occasional hallucinations. Denies active intent to kill himself but still feels somewhat hopeless. No new behavior problems. Intermittent ability to care for his activities of daily living. Patient states he has been started on new medicine and is optimistic about it. Valproic acid level was ordered to be drawn today but for unknown reasons this order was not carried out.  Follow-up for Sunday the 23rd. Patient seen. Late in the morning he is once again in bed sound asleep. On waking up he says he is feeling very tired. Patient is not on significantly more medicine than before. He has started Depakote again. He is not however delirious. Says he is still having hallucinations" client's to discuss suicidal ideation.  03/08/2017. Lance [name] feels tired. His hygiene is terrible. He seems last in thoughts interacting minimally. Complains of cold room. He is secluded to his room except for meals.  03/09/2017. Lance [name] was somewhat agitated last night but better today. He complains of feeling sleepy. Indeed his ammonia is elevated. He is preoccupied with his belongings at the group home, frequent blood work and what to do if he feels "funny" again. Feels somewhartdepressed and wants a cigarette. Sleep and appetite are fair. No somatic complaints.  Per nursing: D: Pt denies SI/HI/AVH. Pt is pleasant and  cooperative. Pt stated he was doing a little better. Pt affect very depressed this evening  A: Pt was offered support and encouragement. Pt was given scheduled medications. Pt was encourage to attend groups. Q 15 minute checks were done for safety.   R:Pt attends groups and interacts well with peers and staff. Pt is taking medication. Pt has no complaints.Pt receptive to treatment and safety maintained on unit.  Principal Problem: Schizoaffective disorder, bipolar type ([place]) Diagnosis:   Patient Active Problem List   Diagnosis Date Noted  . Tobacco use disorder [F17.200] 09/01/2016  . Schizoaffective disorder, bipolar type ([place]) [F25.0] 05/22/2016  . Diabetes mellitus without complication ([place]) [F12.1] 05/21/2016  . Hyperlipidemia [E78.5] 05/21/2016  . History of pulmonary embolism [Z86.711] 05/21/2016  . Chronic anticoagulation [Z79.01] 05/21/2016  . Hypertension [I10] 09/25/2015   Total Time spent with patient: 30 minutes  Past Psychiatric History: Long history of schizophrenia or schizoaffective disorder with multiple hospitalizations and medication trials and a history of PE on Risperdal. He is more stable on Clozapine. He is in the care of [facility] ACT team.   Past Medical History:  Past Medical History:  Diagnosis Date  . Depression   . Diabetes mellitus without complication ([place])   . Hyperlipidemia   . Hypertension   . Lupus anticoagulant disorder ([place]) 06/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  . PE (pulmonary thromboembolism) ([place])   . Schizo affective schizophrenia ([place])   . Supratherapeutic INR 11/19/2016   History reviewed. No pertinent surgical history. Family History:  Family History  Problem Relation Age of Onset  . CAD Mother   . CAD Sister    Family Psychiatric  History: see H&P. Social History:  History  Alcohol Use No    Comment: occassionally     History  Drug Use  . Types: Marijuana    Comment: Last use 11/29/16    Social History    Social History  . Marital status: Single    Spouse name: N/A  . Number of children: N/A  . Years of education: N/A   Social History Main Topics  . Smoking status: Current Every Day Smoker    Packs/day: 1.00    Years: 23.00    Types: Cigarettes  . Smokeless tobacco: Never Used  . Alcohol use No     Comment: occassionally  . Drug use: Yes    Types: Marijuana     Comment: Last use 11/29/16  . Sexual activity: No     Comment: occasional marijuana- none recently   Other Topics Concern  . None   Social History Narrative   From a group home in [place]   Additional Social History: The patient is an incompetent adult and [place] [employer] is the guardian. He just relocated to a new group home a week or so ago. He may return there.                         Sleep: Fair  Appetite:  Fair  Current Medications: Current Facility-Administered Medications  Medication Dose Route Frequency  Provider Last Rate Last Dose  . acetaminophen (TYLENOL) tablet 650 mg  650 mg Oral Q6H PRN Clapacs, [name], MD      . albuterol (PROVENTIL HFA;VENTOLIN HFA) 108 (90 Base) MCG/ACT inhaler 1-2 puff  1-2 puff Inhalation Q4H PRN Clapacs, Madie Reno, MD   2 puff at 03/03/17 2127  . alum & mag hydroxide-simeth (MAALOX/MYLANTA) 200-200-20 MG/5ML suspension 30 mL  30 mL Oral Q4H PRN Clapacs, [name], MD      . buPROPion (WELLBUTRIN XL) 24 hr tablet 150 mg  150 mg Oral Daily Jlon Betker B, MD   150 mg at 03/09/17 0823  . clonazePAM (KLONOPIN) tablet 0.5 mg  0.5 mg Oral BID Cyana Shook B, MD   0.5 mg at 03/09/17 0823  . cloZAPine (CLOZARIL) tablet 500 mg  500 mg Oral QHS Clapacs, Madie Reno, MD   500 mg at 03/08/17 2113  . docusate sodium (COLACE) capsule 200 mg  200 mg Oral BID Clapacs, Madie Reno, MD   200 mg at 03/09/17 3009  . ibuprofen (ADVIL,MOTRIN) tablet 600 mg  600 mg Oral Q8H PRN Clapacs, Madie Reno, MD   600 mg at 03/08/17 1717  . insulin detemir (LEVEMIR) injection 14 Units  14 Units Subcutaneous  Honor Junes, Madie Reno, MD   14 Units at 03/09/17 [phone]  . ipratropium (ATROVENT) 0.06 % nasal spray 1 spray  1 spray Each Nare QHS Clapacs, Madie Reno, MD   1 spray at 03/03/17 2134  . lithium carbonate (ESKALITH) CR tablet 450 mg  450 mg Oral Q12H Clapacs, Madie Reno, MD   450 mg at 03/09/17 0826  . magnesium hydroxide (MILK OF MAGNESIA) suspension 30 mL  30 mL Oral Daily PRN Clapacs, [name], MD      . metFORMIN (GLUCOPHAGE-XR) 24 hr tablet 500 mg  500 mg Oral BID WC Clapacs, Madie Reno, MD   500 mg at 03/09/17 0762  . mometasone-formoterol (DULERA) 200-5 MCG/ACT inhaler 2 puff  2 puff Inhalation BID Clapacs, [name], MD   2 puff at 03/09/17 [phone]  . nicotine (NICODERM CQ - dosed in mg/24 hours) patch 21 mg  21 mg Transdermal Daily Clapacs, [name], MD   21 mg at 03/03/17 0900  . polyethylene glycol (MIRALAX / GLYCOLAX) packet 17 g  17 g Oral Daily Clapacs, [name], MD   17 g at 03/09/17 0830  . simvastatin (ZOCOR) tablet 40 mg  40 mg Oral QHS Clapacs, Madie Reno, MD   40 mg at 03/08/17 2113  . traZODone (DESYREL) tablet 100 mg  100 mg Oral QHS Gracyn Allor B, MD   100 mg at 03/08/17 2113  . warfarin (COUMADIN) tablet 5 mg  5 mg Oral q1800 Clapacs, Madie Reno, MD   5 mg at 03/08/17 1709  . Warfarin - Physician Dosing Inpatient   Does not apply q1800 Clapacs, Madie Reno, MD        Lab Results:  Results for orders placed or performed during the hospital encounter of 03/02/17 (from the past 48 hour(s))  Glucose, capillary     Status: Abnormal   Collection Time: 03/08/17  6:46 AM  Result Value Ref Range   Glucose-Capillary 128 (H) 65 - 99 mg/dL  Ammonia     Status: Abnormal   Collection Time: 03/08/17 11:54 AM  Result Value Ref Range   Ammonia 86 (H) 9 - 35 umol/L  Protime-INR     Status: Abnormal   Collection Time: 03/08/17 11:54 AM  Result Value  Ref Range   Prothrombin Time 23.9 (H) 11.4 - 15.2 seconds   INR 2.16   Lithium level     Status: None   Collection Time: 03/08/17 11:55 AM  Result Value Ref Range    Lithium Lvl 0.80 0.60 - 1.20 mmol/L  Glucose, capillary     Status: None   Collection Time: 03/09/17  6:51 AM  Result Value Ref Range   Glucose-Capillary 99 65 - 99 mg/dL    Blood Alcohol level:  Lab Results  Component Value Date   ETH <5 02/26/2017   ETH <5 82/42/3536    Metabolic Disorder Labs: Lab Results  Component Value Date   HGBA1C 5.3 03/03/2017   MPG 105.41 03/03/2017   MPG 111 12/30/2016   Lab Results  Component Value Date   PROLACTIN 2.0 (L) 08/31/2016   PROLACTIN 4.8 05/22/2016   Lab Results  Component Value Date   CHOL 131 03/03/2017   TRIG 207 (H) 03/03/2017   HDL 31 (L) 03/03/2017   CHOLHDL 4.2 03/03/2017   VLDL 41 (H) 03/03/2017   LDLCALC 59 03/03/2017   LDLCALC 59 12/30/2016    Physical Findings: AIMS:  , ,  ,  ,    CIWA:    COWS:     Musculoskeletal: Strength & Muscle Tone: within normal limits Gait & Station: normal Patient leans: N/A  Psychiatric Specialty Exam: Physical Exam  Nursing note and vitals reviewed. Constitutional: He appears well-developed and well-nourished.  HENT:  Head: Normocephalic and atraumatic.  Eyes: Pupils are equal, round, and reactive to light. Conjunctivae are normal.  Neck: Normal range of motion.  Cardiovascular: Normal heart sounds.   Respiratory: Effort normal. No respiratory distress.  GI: Soft.  Musculoskeletal: Normal range of motion.  Neurological: He is alert.  Skin: Skin is warm and dry.  Psychiatric: His speech is normal. His mood appears anxious. His affect is blunt. He is slowed and withdrawn. Thought content is paranoid and delusional. Cognition and memory are normal. He expresses impulsivity.    Review of Systems  Constitutional: Positive for malaise/fatigue.  HENT: Negative.   Eyes: Negative.   Respiratory: Negative.   Cardiovascular: Negative.   Gastrointestinal: Negative.   Genitourinary: Negative.   Musculoskeletal: Negative.   Skin: Negative.   Neurological: Negative.    Endo/Heme/Allergies: Negative.   Psychiatric/Behavioral: Positive for depression. The patient is nervous/anxious.     Blood pressure 136/82, pulse 75, temperature 97.7 F (36.5 C), temperature source Oral, resp. rate 18, height '5\' 10"'$  (1.778 m), weight 86.2 kg (190 lb), SpO2 100 %.Body mass index is 27.26 kg/m.  General Appearance: Disheveled  Eye Contact:  Good  Speech:  Normal Rate  Volume:  Normal  Mood:  Anxious  Affect:  Congruent  Thought Process:  Goal Directed and Descriptions of Associations: Intact  Orientation:  Full (Time, Place, and Person)  Thought Content:  Delusions and Paranoid Ideation  Suicidal Thoughts:  No  Homicidal Thoughts:  No  Memory:  Immediate;   Fair Recent;   Fair Remote;   Fair  Judgement:  Fair  Insight:  Present  Psychomotor Activity:  Normal  Concentration:  Concentration: Fair and Attention Span: Fair  Recall:  [employer] of Knowledge:  Fair  Language:  Fair  Akathisia:  No  Handed:  Right  AIMS (if indicated):     Assets:  Communication Skills Desire for Improvement Financial Resources/Insurance Housing Resilience Social Support  ADL's:  Intact  Cognition:  WNL  Sleep:  Number of Hours:  8.15     Treatment Plan Summary: Daily contact with patient to assess and evaluate symptoms and progress in treatment and Medication management   Lance [name] is a 41 year old male with a history of schizoaffective disorder admitting for worsening of psychosis in spite of good treatment compliance.  1. Psychosis. We continue clozapine 500 mg nightly for psychosis, Wellbutrin for depression, and lithium for mood stabilization. Lithium level was therapeutic, Clozapine level 762. We added low dose Depakote for further mood stabilization. VPA level 64, ammonia 57. We will discontinue Depakote.   2. Anxiety. We will lower clonazepam to 0.5 mg bid.    3. Tooth abscess. He is on clindamycin.  4. Diabetes. He is on Lantus and metformin, ADA diet and  blood glucose monitoring. Blood glocose is not elevated. We will discontinue testing.  5. Dyslipidemia. He is on Zocor.  6. Constipation. He is on bowel regimen.  7. History of pulmonary embolism. He is on Coumadin per pharmacy dosing.  8. Insomnia. He is on trazodone.  9. Smoking. Nicotine patch is available.  10. COPD. He is on inhalers.  11. Disposition. He will be discharged back to his group home. He will follow up with [facility] act team.   Orson Slick, MD 03/09/2017, 3:04 PM

## 2017-03-09 NOTE — Progress Notes (Signed)
CSW spoke with legal guardian Lance [name], who said she has received the release and will return it soon.  Pt recently moved to new group home.  ACT team has confirmed that he can return there.  Not sure if the change is part of the current problem.   Lance [name], MSW, LCSW Clinical Social Worker 03/09/2017 3:57 PM

## 2017-03-09 NOTE — Progress Notes (Signed)
D:  Patient  Upset from the number of  Times he has  Had blood drawn from him .  Sleeping during shift. No unit participation .  Patient stated slept good last night .Stated appetite is good and energy level  Is normal. Stated concentration is good . Stated on Depression scale 0 , hopeless 0 and anxiety 0 .( low 0-10 high) Denies suicidal  homicidal ideations  .  No auditory hallucinations  No pain concerns . Appropriate ADL'S. No Interacting with peers and staff.  A: Encourage patient participation with unit programming . Instruction  Given on  Medication , verbalize understanding. R: Voice no other concerns. Staff continue to monitor

## 2017-03-10 LAB — GLUCOSE, CAPILLARY
GLUCOSE-CAPILLARY: 111 mg/dL — AB (ref 65–99)
Glucose-Capillary: 115 mg/dL — ABNORMAL HIGH (ref 65–99)

## 2017-03-10 LAB — PROTIME-INR
INR: 1.89
Prothrombin Time: 21.5 seconds — ABNORMAL HIGH (ref 11.4–15.2)

## 2017-03-10 MED ORDER — LACTULOSE 10 GM/15ML PO SOLN
30.0000 g | Freq: Two times a day (BID) | ORAL | Status: DC
  Administered 2017-03-10 – 2017-03-11 (×2): 30 g via ORAL
  Filled 2017-03-10 (×2): qty 60

## 2017-03-10 NOTE — Progress Notes (Signed)
Patient ID: Lance [name], male   DOB: 26-Nov-1975, 41 y.o.   MRN: [id number] Disheveled, limited interaction with peers and staffs, denied pain, denied SI/HI/AVH; difficult to wake in the morning, noncompliant with Daily PTT/INR and requires a lot of encouragement.

## 2017-03-10 NOTE — Plan of Care (Signed)
Problem: Education: Goal: Ability to make informed decisions regarding treatment will improve Outcome: Not Progressing Not participating in unit programing   Problem: Coping: Goal: Ability to cope will improve Outcome: Not Progressing Not participating in unit programing   Problem: Health Behavior/Discharge Planning: Goal: Identification of resources available to assist in meeting health care needs will improve Outcome: Not Progressing Not participating in unit programing   Problem: Medication: Goal: Compliance with prescribed medication regimen will improve Outcome: Progressing Verbalize understanding  Of information received   Problem: Activity: Goal: Sleeping patterns will improve Outcome: Progressing Voice no concerns around sleep  Problem: [facility] Concurrent Medical Problem Goal: STG-Compliance with medication and/or treatment as ordered (STG-Compliance with medication and/or treatment as ordered by MD)  Outcome: Progressing Compliant  with medication   Problem: Education: Goal: Knowledge of [place] [facility] Education information/materials will improve Outcome: Not Progressing Not participating in unit programing   Problem: Safety: Goal: Ability to remain free from injury will improve Outcome: Progressing No injuries this admission   Problem: Health Behavior/Discharge Planning: Goal: Ability to manage health-related needs will improve Outcome: Progressing Patient  Compliant  With blood  Draws in am  Problem: Pain Managment: Goal: General experience of comfort will improve Outcome: Progressing Voice no concerns around this admission   Problem: Physical Regulation: Goal: Ability to maintain clinical measurements within normal limits will improve Outcome: Not Progressing Not participating in unit programing  Goal: Will remain free from infection Outcome: Progressing Encourage  Good  Hand washing   Problem: Skin Integrity: Goal: Risk for impaired skin  integrity will decrease Outcome: Not Progressing Encourage  Good hygiene  Patient  Does not always  Take  abath  Problem: Activity: Goal: Risk for activity intolerance will decrease Outcome: Progressing Not participating in unit programing   Problem: Nutrition: Goal: Adequate nutrition will be maintained Outcome: Progressing Patient has good appetite

## 2017-03-10 NOTE — Progress Notes (Signed)
D: Patient allowed  Blood stick this am . Affect  Cheerful on approach . Encoutrage patient to take a bath . Patient  Has slept in clothes for last 2 days  No unit participation  Pacing halls on unit . " The doctor told me to get out of my room more that's what Im doing "  Denies auditory hallucinations . Appetite good  Denies any pain issues .  A: Encourage patient participation with unit programming . Instruction  Given on  Medication , verbalize understanding. R: Voice no other concerns. Staff continue to monitor

## 2017-03-10 NOTE — Plan of Care (Signed)
Problem: Activity: Goal: Sleeping patterns will improve Outcome: Progressing Patient slept for Estimated Hours of 7.15; Precautionary checks every 15 minutes for safety maintained, room free of safety hazards, patient sustains no injury or falls during this shift.    

## 2017-03-10 NOTE — Progress Notes (Signed)
CH spoke briefly with patient as we walked around the corridors. Patient is lethargic compared to previous encounters. Patient stated he did play basketball earlier.    03/10/17 1845  Clinical Encounter Type  Visited With Patient  Visit Type Initial;Follow-up;Behavioral Health  Referral From Patient

## 2017-03-10 NOTE — [facility] Group Notes (Signed)
[facility] Group Notes:  (Nursing/MHT/Case Management/Adjunct)  Date:  03/10/2017  Time:  2:48 PM  Type of Therapy:  Psychoeducational Skills  Participation Level:  Did Not Attend   Wilson Singer 03/10/2017, 2:48 PM

## 2017-03-10 NOTE — [facility] Group Notes (Signed)
LCSW Group Therapy Note  03/10/2017 1:00pm  Type of Therapy/Topic:  Group Therapy:  Emotion Regulation  Participation Level:  Did Not Attend   Description of Group:   The purpose of this group is to assist patients in learning to regulate negative emotions and experience positive emotions. Patients will be guided to discuss ways in which they have been vulnerable to their negative emotions. These vulnerabilities will be juxtaposed with experiences of positive emotions or situations, and patients will be challenged to use positive emotions to combat negative ones. Special emphasis will be placed on coping with negative emotions in conflict situations, and patients will process healthy conflict resolution skills.  Therapeutic Goals: 1. Patient will identify two positive emotions or experiences to reflect on in order to balance out negative emotions 2. Patient will label two or more emotions that they find the most difficult to experience 3. Patient will demonstrate positive conflict resolution skills through discussion and/or role plays  Summary of Patient Progress:       Therapeutic Modalities:   Cognitive Behavioral Therapy Feelings Identification Dialectical Behavioral Therapy   Glennon Mac, LCSW 03/10/2017 4:50 PM

## 2017-03-10 NOTE — Progress Notes (Signed)
[facility] MD Progress Note  03/10/2017 8:45 AM Lance [name]  MRN:  [id number]  Subjective:  History of present illness. Information was obtained from the patient and the chart. Lance [name] came to the emergency room complaining of increased of irritation, getting bad feelings about other people, and feeling that he is about to lash out. He did not acted out on these feelings but they are very ego-dystonic. The patient is not a violent person. He decided to come to the hospital. The patient denies any symptoms of depression, anxiety or psychosis. He has been stable of on these medications Clozaril, Wellbutrin, and lithium. This is confirmed by therapeutic lithium level. She does not use alcohol or illicit substances. The patient reports that recently she moved to a new group home. It is cleaner, newer, and nicer than the old one. The food is good. Patient was somewhat anxious about the location. He has his room there. Change of group homes was recommended by his act team as well as his guardian.   03/03/2017. Lance [name] met with treatment team today. He still feels on the edge and his "head is not straight". He denies thoughts of hurting himself. He denies intention to hurt others but feels uneasy around people. There is no history of violence. Lance [name] has been out of his room today somewhat restless walking around the unit. He slept well with Trazodone. He accepts medications and tolerates them well. He reports no side effects from Depakote we started yesterday. Due to the history of PE we do not want to introduce another antipsychotic. We are checking Clozapine level. His hygiene is poor. This is uncharacteristic.   03/04/2017. Lance [name] reports no improvement. He still feels on the edge. He denies thoughts of hurting other people. He has questions about addition of Depakote. He was explained again that we try to avoid medication that would increase his risk for PE. There are no somatic complaints. He looks  unkept. Sleep and appetite are fair.   03/05/2017. Lance [name] still feels on the edge and has passing thoughts of losing control. He is annoyed by some of his peers but denies wanting to hurt anybody. He looks a little better groomed today. He still has been pacing the hallways. He came to the office and had a recent conversation with me. He noticed that today's the first day of fall. He didn't go outside briefly. He is asking for something to curb his anxiety. He tolerated Depakote well.  Follow-up for Saturday the 22nd. Patient was in bed late today and says he is feeling tired. Says he is still feeling down. Occasional hallucinations. Denies active intent to kill himself but still feels somewhat hopeless. No new behavior problems. Intermittent ability to care for his activities of daily living. Patient states he has been started on new medicine and is optimistic about it. Valproic acid level was ordered to be drawn today but for unknown reasons this order was not carried out.  Follow-up for Sunday the 23rd. Patient seen. Late in the morning he is once again in bed sound asleep. On waking up he says he is feeling very tired. Patient is not on significantly more medicine than before. He has started Depakote again. He is not however delirious. Says he is still having hallucinations" client's to discuss suicidal ideation.  03/08/2017. Lance [name] feels tired. His hygiene is terrible. He seems last in thoughts interacting minimally. Complains of cold room. He is secluded to his room except for meals.  03/09/2017. Lance [name] was somewhat agitated last night but better today. He complains of feeling sleepy. Indeed his ammonia is elevated. He is preoccupied with his belongings at the group home, frequent blood work and what to do if he feels "funny" again. Feels somewhartdepressed and wants a cigarette. Sleep and appetite are fair. No somatic complaints.  03/10/2017. Lance [name] still feels sedated today, possibly from  elevated ammonia. We will start Lactulose. He still complains about feeling "funny" and wonders how to handle it best. He has been using coping skills very effectively lately but it is possible that relocation to a new group home has been more stressful that anticipated. He has not been out of his room today. There are no somatic complaints except feeling tired. He has no problems accepting medications.   Per nursing: Disheveled, limited interaction with peers and staffs, denied pain, denied SI/HI/AVH; difficult to wake in the morning, noncompliant with Daily PTT/INR and requires a lot of encouragement.  Principal Problem: Schizoaffective disorder, bipolar type ([place]) Diagnosis:   Patient Active Problem List   Diagnosis Date Noted  . Tobacco use disorder [F17.200] 09/01/2016  . Schizoaffective disorder, bipolar type ([place]) [F25.0] 05/22/2016  . Diabetes mellitus without complication ([place]) [S50.5] 05/21/2016  . Hyperlipidemia [E78.5] 05/21/2016  . History of pulmonary embolism [Z86.711] 05/21/2016  . Chronic anticoagulation [Z79.01] 05/21/2016  . Hypertension [I10] 09/25/2015   Total Time spent with patient: 30 minutes  Past Psychiatric History: Long history of schizophrenia or schizoaffective disorder with multiple hospitalizations and medication trials and a history of PE on Risperdal. He is more stable on Clozapine. He is in the care of [facility] ACT team.   Past Medical History:  Past Medical History:  Diagnosis Date  . Depression   . Diabetes mellitus without complication ([place])   . Hyperlipidemia   . Hypertension   . Lupus anticoagulant disorder ([place]) 06/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  . PE (pulmonary thromboembolism) ([place])   . Schizo affective schizophrenia ([place])   . Supratherapeutic INR 11/19/2016   History reviewed. No pertinent surgical history. Family History:  Family History  Problem Relation Age of Onset  . CAD Mother   . CAD Sister    Family  Psychiatric  History: see H&P. Social History:  History  Alcohol Use No    Comment: occassionally     History  Drug Use  . Types: Marijuana    Comment: Last use 11/29/16    Social History   Social History  . Marital status: Single    Spouse name: N/A  . Number of children: N/A  . Years of education: N/A   Social History Main Topics  . Smoking status: Current Every Day Smoker    Packs/day: 1.00    Years: 23.00    Types: Cigarettes  . Smokeless tobacco: Never Used  . Alcohol use No     Comment: occassionally  . Drug use: Yes    Types: Marijuana     Comment: Last use 11/29/16  . Sexual activity: No     Comment: occasional marijuana- none recently   Other Topics Concern  . None   Social History Narrative   From a group home in [place]   Additional Social History: The patient is an incompetent adult and [place] [employer] is the guardian. He just relocated to a new group home a week or so ago. He may return there.  Sleep: Fair  Appetite:  Fair  Current Medications: Current Facility-Administered Medications  Medication Dose Route Frequency Provider Last Rate Last Dose  . acetaminophen (TYLENOL) tablet 650 mg  650 mg Oral Q6H PRN Clapacs, [name], MD      . albuterol (PROVENTIL HFA;VENTOLIN HFA) 108 (90 Base) MCG/ACT inhaler 1-2 puff  1-2 puff Inhalation Q4H PRN Clapacs, Madie Reno, MD   2 puff at 03/09/17 2118  . alum & mag hydroxide-simeth (MAALOX/MYLANTA) 200-200-20 MG/5ML suspension 30 mL  30 mL Oral Q4H PRN Clapacs, [name], MD      . buPROPion (WELLBUTRIN XL) 24 hr tablet 150 mg  150 mg Oral Daily Rion Catala B, MD   150 mg at 03/10/17 0826  . clonazePAM (KLONOPIN) tablet 0.5 mg  0.5 mg Oral BID Jaaziah Schulke B, MD   0.5 mg at 03/10/17 0826  . cloZAPine (CLOZARIL) tablet 500 mg  500 mg Oral QHS Clapacs, Madie Reno, MD   500 mg at 03/09/17 2117  . docusate sodium (COLACE) capsule 200 mg  200 mg Oral BID Clapacs, Madie Reno, MD   200 mg  at 03/10/17 0826  . ibuprofen (ADVIL,MOTRIN) tablet 600 mg  600 mg Oral Q8H PRN Clapacs, Madie Reno, MD   600 mg at 03/08/17 1717  . insulin detemir (LEVEMIR) injection 14 Units  14 Units Subcutaneous Charlett Blake, MD   14 Units at 03/10/17 [id number]  . ipratropium (ATROVENT) 0.06 % nasal spray 1 spray  1 spray Each Nare QHS Clapacs, Madie Reno, MD   1 spray at 03/03/17 2134  . lithium carbonate (ESKALITH) CR tablet 450 mg  450 mg Oral Q12H Clapacs, Madie Reno, MD   450 mg at 03/10/17 0826  . magnesium hydroxide (MILK OF MAGNESIA) suspension 30 mL  30 mL Oral Daily PRN Clapacs, [name], MD      . metFORMIN (GLUCOPHAGE-XR) 24 hr tablet 500 mg  500 mg Oral BID WC Clapacs, Madie Reno, MD   500 mg at 03/10/17 0826  . mometasone-formoterol (DULERA) 200-5 MCG/ACT inhaler 2 puff  2 puff Inhalation BID Clapacs, Madie Reno, MD   2 puff at 03/10/17 0826  . nicotine (NICODERM CQ - dosed in mg/24 hours) patch 21 mg  21 mg Transdermal Daily Clapacs, [name], MD   21 mg at 03/03/17 0900  . polyethylene glycol (MIRALAX / GLYCOLAX) packet 17 g  17 g Oral Daily Clapacs, Madie Reno, MD   17 g at 03/10/17 0826  . simvastatin (ZOCOR) tablet 40 mg  40 mg Oral QHS Clapacs, Madie Reno, MD   40 mg at 03/09/17 2117  . traZODone (DESYREL) tablet 100 mg  100 mg Oral QHS Maheen Cwikla B, MD   100 mg at 03/09/17 2117  . warfarin (COUMADIN) tablet 5 mg  5 mg Oral q1800 Clapacs, Madie Reno, MD   5 mg at 03/09/17 1710  . Warfarin - Physician Dosing Inpatient   Does not apply q1800 Clapacs, Madie Reno, MD        Lab Results:  Results for orders placed or performed during the hospital encounter of 03/02/17 (from the past 48 hour(s))  Ammonia     Status: Abnormal   Collection Time: 03/08/17 11:54 AM  Result Value Ref Range   Ammonia 86 (H) 9 - 35 umol/L  Protime-INR     Status: Abnormal   Collection Time: 03/08/17 11:54 AM  Result Value Ref Range   Prothrombin Time 23.9 (H) 11.4 - 15.2 seconds  INR 2.16   Lithium level     Status: None   Collection  Time: 03/08/17 11:55 AM  Result Value Ref Range   Lithium Lvl 0.80 0.60 - 1.20 mmol/L  Glucose, capillary     Status: None   Collection Time: 03/09/17  6:51 AM  Result Value Ref Range   Glucose-Capillary 99 65 - 99 mg/dL  Protime-INR     Status: Abnormal   Collection Time: 03/09/17  2:46 PM  Result Value Ref Range   Prothrombin Time 24.0 (H) 11.4 - 15.2 seconds   INR 2.17   Glucose, capillary     Status: Abnormal   Collection Time: 03/09/17  8:21 PM  Result Value Ref Range   Glucose-Capillary 106 (H) 65 - 99 mg/dL  Glucose, capillary     Status: Abnormal   Collection Time: 03/10/17  6:28 AM  Result Value Ref Range   Glucose-Capillary 111 (H) 65 - 99 mg/dL  Protime-INR     Status: Abnormal   Collection Time: 03/10/17  6:39 AM  Result Value Ref Range   Prothrombin Time 21.5 (H) 11.4 - 15.2 seconds   INR 1.89     Blood Alcohol level:  Lab Results  Component Value Date   ETH <5 02/26/2017   ETH <5 34/28/7681    Metabolic Disorder Labs: Lab Results  Component Value Date   HGBA1C 5.3 03/03/2017   MPG 105.41 03/03/2017   MPG 111 12/30/2016   Lab Results  Component Value Date   PROLACTIN 2.0 (L) 08/31/2016   PROLACTIN 4.8 05/22/2016   Lab Results  Component Value Date   CHOL 131 03/03/2017   TRIG 207 (H) 03/03/2017   HDL 31 (L) 03/03/2017   CHOLHDL 4.2 03/03/2017   VLDL 41 (H) 03/03/2017   LDLCALC 59 03/03/2017   LDLCALC 59 12/30/2016    Physical Findings: AIMS:  , ,  ,  ,    CIWA:    COWS:     Musculoskeletal: Strength & Muscle Tone: within normal limits Gait & Station: normal Patient leans: N/A  Psychiatric Specialty Exam: Physical Exam  Nursing note and vitals reviewed. Constitutional: He appears well-developed and well-nourished.  HENT:  Head: Normocephalic and atraumatic.  Eyes: Pupils are equal, round, and reactive to light. Conjunctivae are normal.  Neck: Normal range of motion.  Cardiovascular: Normal heart sounds.   Respiratory: Effort  normal. No respiratory distress.  GI: Soft.  Musculoskeletal: Normal range of motion.  Neurological: He is alert.  Skin: Skin is warm and dry.  Psychiatric: His speech is normal. His mood appears anxious. His affect is blunt. He is slowed and withdrawn. Thought content is paranoid and delusional. Cognition and memory are normal. He expresses impulsivity.    Review of Systems  Constitutional: Positive for malaise/fatigue.  HENT: Negative.   Eyes: Negative.   Respiratory: Negative.   Cardiovascular: Negative.   Gastrointestinal: Negative.   Genitourinary: Negative.   Musculoskeletal: Negative.   Skin: Negative.   Neurological: Negative.   Endo/Heme/Allergies: Negative.   Psychiatric/Behavioral: Positive for depression. The patient is nervous/anxious.     Blood pressure 107/89, pulse 70, temperature 98.2 F (36.8 C), temperature source Oral, resp. rate 18, height '5\' 10"'  (1.778 m), weight 86.2 kg (190 lb), SpO2 100 %.Body mass index is 27.26 kg/m.  General Appearance: Disheveled  Eye Contact:  Good  Speech:  Normal Rate  Volume:  Normal  Mood:  Anxious  Affect:  Congruent  Thought Process:  Goal Directed and Descriptions of Associations: Intact  Orientation:  Full (Time, Place, and Person)  Thought Content:  Delusions and Paranoid Ideation  Suicidal Thoughts:  No  Homicidal Thoughts:  No  Memory:  Immediate;   Fair Recent;   Fair Remote;   Fair  Judgement:  Fair  Insight:  Present  Psychomotor Activity:  Normal  Concentration:  Concentration: Fair and Attention Span: Fair  Recall:  [employer] of Knowledge:  Fair  Language:  Fair  Akathisia:  No  Handed:  Right  AIMS (if indicated):     Assets:  Communication Skills Desire for Improvement Financial Resources/Insurance Housing Resilience Social Support  ADL's:  Intact  Cognition:  WNL  Sleep:  Number of Hours: 7.15     Treatment Plan Summary: Daily contact with patient to assess and evaluate symptoms and  progress in treatment and Medication management   Mr. Castile is a 41 year old male with a history of schizoaffective disorder admitting for worsening of psychosis in spite of good treatment compliance.  1. Psychosis. We continue clozapine 500 mg nightly for psychosis, Wellbutrin for depression, and lithium for mood stabilization. Lithium level was therapeutic, Clozapine level 762. We added low dose Depakote for further mood stabilization. VPA level 64, ammonia 57. We discontinued Depakote and started Lactulose.  2. Anxiety. We will lower clonazepam to 0.5 mg bid.    3. Tooth abscess. He is on clindamycin.  4. Diabetes. He is on Lantus and metformin, ADA diet and blood glucose monitoring. Blood glocose is not elevated. We will discontinue testing.  5. Dyslipidemia. He is on Zocor.  6. Constipation. He is on bowel regimen.  7. History of pulmonary embolism. He is on Coumadin per pharmacy dosing.  8. Insomnia. He is on trazodone.  9. Smoking. Nicotine patch is available.  10. COPD. He is on inhalers.  11. Disposition. He will be discharged back to his group home. He will follow up with [facility] act team.   Orson Slick, MD 03/10/2017, 8:45 AM

## 2017-03-11 LAB — PROTIME-INR
INR: 1.59
PROTHROMBIN TIME: 18.8 s — AB (ref 11.4–15.2)

## 2017-03-11 LAB — GLUCOSE, CAPILLARY: Glucose-Capillary: 126 mg/dL — ABNORMAL HIGH (ref 65–99)

## 2017-03-11 MED ORDER — WARFARIN SODIUM 6 MG PO TABS: 6.0000 mg | ORAL_TABLET | Freq: Every day | ORAL | Status: DC

## 2017-03-11 MED ORDER — WARFARIN SODIUM 5 MG PO TABS
5.0000 mg | ORAL_TABLET | Freq: Every day | ORAL | Status: DC
  Administered 2017-03-11: 5 mg via ORAL
  Filled 2017-03-11: qty 1

## 2017-03-11 MED ORDER — TRAZODONE HCL 50 MG PO TABS
150.0000 mg | ORAL_TABLET | Freq: Every day | ORAL | Status: DC
  Administered 2017-03-11: 21:00:00 150 mg via ORAL
  Filled 2017-03-11: qty 1

## 2017-03-11 NOTE — [facility] Group Notes (Signed)
[facility] LCSW Group Therapy Note  Date/Time: 03/11/17, 1300  Type of Therapy/Topic:  Group Therapy:  Balance in Life  Participation Level: Did not attend   Description of Group:    This group will address the concept of balance and how it feels and looks when one is unbalanced. Patients will be encouraged to process areas in their lives that are out of balance, and identify reasons for remaining unbalanced. Facilitators will guide patients utilizing problem- solving interventions to address and correct the stressor making their life unbalanced. Understanding and applying boundaries will be explored and addressed for obtaining  and maintaining a balanced life. Patients will be encouraged to explore ways to assertively make their unbalanced needs known to significant others in their lives, using other group members and facilitator for support and feedback.  Therapeutic Goals: 1. Patient will identify two or more emotions or situations they have that consume much of in their lives. 2. Patient will identify signs/triggers that life has become out of balance:  3. Patient will identify two ways to set boundaries in order to achieve balance in their lives:  4. Patient will demonstrate ability to communicate their needs through discussion and/or role plays  Summary of Patient Progress:          Therapeutic Modalities:   Cognitive Behavioral Therapy Solution-Focused Therapy Assertiveness Training  Greg Krystol Rocco, LCSW 

## 2017-03-11 NOTE — Plan of Care (Signed)
Problem: Education: Goal: Ability to make informed decisions regarding treatment will improve Outcome: Progressing Patient remains in bed for a large part of shift    Problem: Coping: Goal: Ability to cope will improve Outcome: Not Progressing Not working on coping  Skills   Problem: Health Behavior/Discharge Planning: Goal: Identification of resources available to assist in meeting health care needs will improve Outcome: Not Progressing Patient  Is not involved with the process of finding another  placement    Problem: Medication: Goal: Compliance with prescribed medication regimen will improve Outcome: Progressing verbalize understanding of medication given   Problem: Activity: Goal: Sleeping patterns will improve Outcome: Progressing Voice no concerns  Around sleep  Problem: [facility] Concurrent Medical Problem Goal: STG-Compliance with medication and/or treatment as ordered (STG-Compliance with medication and/or treatment as ordered by MD)  Outcome: Progressing Compliant  With medication   Problem: Education: Goal: Knowledge of [place] [facility] Education information/materials will improve Outcome: Not Progressing Information given concretely  Needing redirection   Problem: Safety: Goal: Ability to remain free from injury will improve Outcome: Progressing  No injuries  This shift     Problem: Health Behavior/Discharge Planning: Goal: Ability to manage health-related needs will improve Outcome: Not Progressing Patient needing help with some information given   Problem: Pain Managment: Goal: General experience of comfort will improve Outcome: Progressing Voice no concerns  Problem: Physical Regulation: Goal: Will remain free from infection Outcome: Progressing Encourage good hand  Washing   Problem: Skin Integrity: Goal: Risk for impaired skin integrity will decrease Outcome: Progressing No breaks in skin

## 2017-03-11 NOTE — Progress Notes (Signed)
Patient pacing in halls of unit . Informed of discharge tomorrow . Patient encourage to bath  This am and wash his clothing . Appetite good voice no concerns around sleep or wake time. Denies  auditory hallucinations  And suicidal  Ideations .    Voice of no goal for today  A: Encourage patient participation with unit programming . Instruction  Given on  Medication , verbalize understanding. R: Voice no other concerns. Staff continue to monitor

## 2017-03-11 NOTE — Progress Notes (Signed)
[facility] MD Progress Note  03/11/2017 2:39 PM Jayd Cadieux  MRN:  [id number]  Subjective:  History of present illness. Information was obtained from the patient and the chart. Mrs. Borchardt came to the emergency room complaining of increased of irritation, getting bad feelings about other people, and feeling that he is about to lash out. He did not acted out on these feelings but they are very ego-dystonic. The patient is not a violent person. He decided to come to the hospital. The patient denies any symptoms of depression, anxiety or psychosis. He has been stable of on these medications Clozaril, Wellbutrin, and lithium. This is confirmed by therapeutic lithium level. She does not use alcohol or illicit substances. The patient reports that recently she moved to a new group home. It is cleaner, newer, and nicer than the old one. The food is good. Patient was somewhat anxious about the location. He has his room there. Change of group homes was recommended by his act team as well as his guardian.   03/03/2017. Mr. Christoffersen met with treatment team today. He still feels on the edge and his "head is not straight". He denies thoughts of hurting himself. He denies intention to hurt others but feels uneasy around people. There is no history of violence. Acie has been out of his room today somewhat restless walking around the unit. He slept well with Trazodone. He accepts medications and tolerates them well. He reports no side effects from Depakote we started yesterday. Due to the history of PE we do not want to introduce another antipsychotic. We are checking Clozapine level. His hygiene is poor. This is uncharacteristic.   03/04/2017. Mr. Hicks reports no improvement. He still feels on the edge. He denies thoughts of hurting other people. He has questions about addition of Depakote. He was explained again that we try to avoid medication that would increase his risk for PE. There are no somatic complaints. He looks  unkept. Sleep and appetite are fair.   03/05/2017. Mr. Zilka still feels on the edge and has passing thoughts of losing control. He is annoyed by some of his peers but denies wanting to hurt anybody. He looks a little better groomed today. He still has been pacing the hallways. He came to the office and had a recent conversation with me. He noticed that today's the first day of fall. He didn't go outside briefly. He is asking for something to curb his anxiety. He tolerated Depakote well.  Follow-up for Saturday the 22nd. Patient was in bed late today and says he is feeling tired. Says he is still feeling down. Occasional hallucinations. Denies active intent to kill himself but still feels somewhat hopeless. No new behavior problems. Intermittent ability to care for his activities of daily living. Patient states he has been started on new medicine and is optimistic about it. Valproic acid level was ordered to be drawn today but for unknown reasons this order was not carried out.  Follow-up for Sunday the 23rd. Patient seen. Late in the morning he is once again in bed sound asleep. On waking up he says he is feeling very tired. Patient is not on significantly more medicine than before. He has started Depakote again. He is not however delirious. Says he is still having hallucinations" client's to discuss suicidal ideation.  03/08/2017. Mr. Riebel feels tired. His hygiene is terrible. He seems last in thoughts interacting minimally. Complains of cold room. He is secluded to his room except for meals.  03/09/2017. Mr. Edmonds was somewhat agitated last night but better today. He complains of feeling sleepy. Indeed his ammonia is elevated. He is preoccupied with his belongings at the group home, frequent blood work and what to do if he feels "funny" again. Feels somewhartdepressed and wants a cigarette. Sleep and appetite are fair. No somatic complaints.  03/10/2017. Mr. Dollins still feels sedated today, possibly from  elevated ammonia. We will start Lactulose. He still complains about feeling "funny" and wonders how to handle it best. He has been using coping skills very effectively lately but it is possible that relocation to a new group home has been more stressful that anticipated. He has not been out of his room today. There are no somatic complaints except feeling tired. He has no problems accepting medications.   03/11/2017. Mr. Barnette reports much improvement today. He no longer feels depressed, excessively anxious, paranoid. There "funny feeling" is gone and he no longer feels that people can easily push his buttons. He is not suicidal or homicidal. He takes medications as prescribed. He reports no side effects. Importantly, she took a shower today and is washing his clothes. He is group home will take him up tomorrow morning.  Per nursing: Always grumpy in the morning for VS and Labs despite reminding him of anticipated plan of care; disheveled, poor hygiene, encouraged to take a shower, refused or ignored encouragement; denied SI/HI/AVH during this shift, denied pain  Principal Problem: Schizoaffective disorder, bipolar type ([place]) Diagnosis:   Patient Active Problem List   Diagnosis Date Noted  . Tobacco use disorder [F17.200] 09/01/2016  . Schizoaffective disorder, bipolar type ([place]) [F25.0] 05/22/2016  . Diabetes mellitus without complication ([place]) [P59.4] 05/21/2016  . Hyperlipidemia [E78.5] 05/21/2016  . History of pulmonary embolism [Z86.711] 05/21/2016  . Chronic anticoagulation [Z79.01] 05/21/2016  . Hypertension [I10] 09/25/2015   Total Time spent with patient: 30 minutes  Past Psychiatric History: Long history of schizophrenia or schizoaffective disorder with multiple hospitalizations and medication trials and a history of PE on Risperdal. He is more stable on Clozapine. He is in the [facility] of [facility] team.   Past Medical History:  Past Medical History:  Diagnosis Date  .  Depression   . Diabetes mellitus without complication ([place])   . Hyperlipidemia   . Hypertension   . Lupus anticoagulant disorder ([place]) 06/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  . PE (pulmonary thromboembolism) ([place])   . Schizo affective schizophrenia ([place])   . Supratherapeutic INR 11/19/2016   History reviewed. No pertinent surgical history. Family History:  Family History  Problem Relation Age of Onset  . CAD Mother   . CAD Sister    Family Psychiatric  History: see H&P. Social History:  History  Alcohol Use No    Comment: occassionally     History  Drug Use  . Types: Marijuana    Comment: Last use 11/29/16    Social History   Social History  . Marital status: Single    Spouse name: N/A  . Number of children: N/A  . Years of education: N/A   Social History Main Topics  . Smoking status: Current Every Day Smoker    Packs/day: 1.00    Years: 23.00    Types: Cigarettes  . Smokeless tobacco: Never Used  . Alcohol use No     Comment: occassionally  . Drug use: Yes    Types: Marijuana     Comment: Last use 11/29/16  . Sexual  activity: No     Comment: occasional marijuana- none recently   Other Topics Concern  . None   Social History Narrative   From a group home in [place]   Additional Social History: The patient is an incompetent adult and [place] [employer] is the guardian. He just relocated to a new group home a week or so ago. He may return there.                         Sleep: Fair  Appetite:  Fair  Current Medications: Current Facility-Administered Medications  Medication Dose Route Frequency Provider Last Rate Last Dose  . acetaminophen (TYLENOL) tablet 650 mg  650 mg Oral Q6H PRN Clapacs, [name], MD      . albuterol (PROVENTIL HFA;VENTOLIN HFA) 108 (90 Base) MCG/ACT inhaler 1-2 puff  1-2 puff Inhalation Q4H PRN Clapacs, Madie Reno, MD   2 puff at 03/09/17 2118  . alum & mag hydroxide-simeth (MAALOX/MYLANTA) 200-200-20 MG/5ML suspension 30  mL  30 mL Oral Q4H PRN Clapacs, [name], MD      . buPROPion (WELLBUTRIN XL) 24 hr tablet 150 mg  150 mg Oral Daily Pucilowska, Jolanta B, MD   150 mg at 03/11/17 0840  . clonazePAM (KLONOPIN) tablet 0.5 mg  0.5 mg Oral BID Pucilowska, Jolanta B, MD   0.5 mg at 03/11/17 0840  . cloZAPine (CLOZARIL) tablet 500 mg  500 mg Oral QHS Clapacs, Madie Reno, MD   500 mg at 03/10/17 2131  . docusate sodium (COLACE) capsule 200 mg  200 mg Oral BID Clapacs, Madie Reno, MD   200 mg at 03/11/17 0840  . ibuprofen (ADVIL,MOTRIN) tablet 600 mg  600 mg Oral Q8H PRN Clapacs, Madie Reno, MD   600 mg at 03/08/17 1717  . insulin detemir (LEVEMIR) injection 14 Units  14 Units Subcutaneous Charlett Blake, MD   14 Units at 03/11/17 [phone]  . ipratropium (ATROVENT) 0.06 % nasal spray 1 spray  1 spray Each Nare QHS Clapacs, Madie Reno, MD   1 spray at 03/03/17 2134  . lactulose (CHRONULAC) 10 GM/15ML solution 30 g  30 g Oral BID Pucilowska, Jolanta B, MD   30 g at 03/11/17 0841  . lithium carbonate (ESKALITH) CR tablet 450 mg  450 mg Oral Q12H Clapacs, Madie Reno, MD   450 mg at 03/11/17 0840  . magnesium hydroxide (MILK OF MAGNESIA) suspension 30 mL  30 mL Oral Daily PRN Clapacs, [name], MD      . metFORMIN (GLUCOPHAGE-XR) 24 hr tablet 500 mg  500 mg Oral BID WC Clapacs, Madie Reno, MD   500 mg at 03/11/17 0840  . mometasone-formoterol (DULERA) 200-5 MCG/ACT inhaler 2 puff  2 puff Inhalation BID Clapacs, Madie Reno, MD   2 puff at 03/11/17 0841  . nicotine (NICODERM CQ - dosed in mg/24 hours) patch 21 mg  21 mg Transdermal Daily Clapacs, [name], MD   21 mg at 03/03/17 0900  . polyethylene glycol (MIRALAX / GLYCOLAX) packet 17 g  17 g Oral Daily Clapacs, Madie Reno, MD   17 g at 03/11/17 0841  . simvastatin (ZOCOR) tablet 40 mg  40 mg Oral QHS Clapacs, Madie Reno, MD   40 mg at 03/10/17 2131  . traZODone (DESYREL) tablet 100 mg  100 mg Oral QHS Pucilowska, Jolanta B, MD   100 mg at 03/10/17 2131  . warfarin (COUMADIN) tablet 5 mg  5 mg Oral q1800 Clapacs,  Madie Reno, MD      . Warfarin - Physician Dosing Inpatient   Does not apply q1800 Clapacs, Madie Reno, MD        Lab Results:  Results for orders placed or performed during the hospital encounter of 03/02/17 (from the past 48 hour(s))  Protime-INR     Status: Abnormal   Collection Time: 03/09/17  2:46 PM  Result Value Ref Range   Prothrombin Time 24.0 (H) 11.4 - 15.2 seconds   INR 2.17   Glucose, capillary     Status: Abnormal   Collection Time: 03/09/17  8:21 PM  Result Value Ref Range   Glucose-Capillary 106 (H) 65 - 99 mg/dL  Glucose, capillary     Status: Abnormal   Collection Time: 03/10/17  6:28 AM  Result Value Ref Range   Glucose-Capillary 111 (H) 65 - 99 mg/dL  Protime-INR     Status: Abnormal   Collection Time: 03/10/17  6:39 AM  Result Value Ref Range   Prothrombin Time 21.5 (H) 11.4 - 15.2 seconds   INR 1.89   Glucose, capillary     Status: Abnormal   Collection Time: 03/10/17  8:51 PM  Result Value Ref Range   Glucose-Capillary 115 (H) 65 - 99 mg/dL   Comment 1 Notify RN   Protime-INR     Status: Abnormal   Collection Time: 03/11/17  6:48 AM  Result Value Ref Range   Prothrombin Time 18.8 (H) 11.4 - 15.2 seconds   INR 1.59   Glucose, capillary     Status: Abnormal   Collection Time: 03/11/17  6:51 AM  Result Value Ref Range   Glucose-Capillary 126 (H) 65 - 99 mg/dL    Blood Alcohol level:  Lab Results  Component Value Date   ETH <5 02/26/2017   ETH <5 19/75/8832    Metabolic Disorder Labs: Lab Results  Component Value Date   HGBA1C 5.3 03/03/2017   MPG 105.41 03/03/2017   MPG 111 12/30/2016   Lab Results  Component Value Date   PROLACTIN 2.0 (L) 08/31/2016   PROLACTIN 4.8 05/22/2016   Lab Results  Component Value Date   CHOL 131 03/03/2017   TRIG 207 (H) 03/03/2017   HDL 31 (L) 03/03/2017   CHOLHDL 4.2 03/03/2017   VLDL 41 (H) 03/03/2017   LDLCALC 59 03/03/2017   LDLCALC 59 12/30/2016    Physical Findings: AIMS:  , ,  ,  ,    CIWA:     COWS:     Musculoskeletal: Strength & Muscle Tone: within normal limits Gait & Station: normal Patient leans: N/A  Psychiatric Specialty Exam: Physical Exam  Nursing note and vitals reviewed. Constitutional: He appears well-developed and well-nourished.  HENT:  Head: Normocephalic and atraumatic.  Eyes: Pupils are equal, round, and reactive to light. Conjunctivae are normal.  Neck: Normal range of motion.  Cardiovascular: Normal heart sounds.   Respiratory: Effort normal. No respiratory distress.  GI: Soft.  Musculoskeletal: Normal range of motion.  Neurological: He is alert.  Skin: Skin is warm and dry.  Psychiatric: His speech is normal. Judgment and thought content normal. His affect is blunt. He is withdrawn. Cognition and memory are normal.    Review of Systems  Constitutional: Negative.   HENT: Negative.   Eyes: Negative.   Respiratory: Negative.   Cardiovascular: Negative.   Gastrointestinal: Negative.   Genitourinary: Negative.   Musculoskeletal: Negative.   Skin: Negative.   Neurological: Negative.   Endo/Heme/Allergies: Negative.  Blood pressure 129/69, pulse 78, temperature 97.8 F (36.6 C), temperature source Oral, resp. rate 18, height '5\' 10"'  (1.778 m), weight 86.2 kg (190 lb), SpO2 100 %.Body mass index is 27.26 kg/m.  General Appearance: Casual  Eye Contact:  Good  Speech:  Normal Rate  Volume:  Normal  Mood:  Anxious  Affect:  Congruent  Thought Process:  Goal Directed and Descriptions of Associations: Intact  Orientation:  Full (Time, Place, and Person)  Thought Content:  WDL  Suicidal Thoughts:  No  Homicidal Thoughts:  No  Memory:  Immediate;   Fair Recent;   Fair Remote;   Fair  Judgement:  Fair  Insight:  Present  Psychomotor Activity:  Normal  Concentration:  Concentration: Fair and Attention Span: Fair  Recall:  [employer] of Knowledge:  Fair  Language:  Fair  Akathisia:  No  Handed:  Right  AIMS (if indicated):     Assets:   Communication Skills Desire for Improvement Financial Resources/Insurance Housing Resilience Social Support  ADL's:  Intact  Cognition:  WNL  Sleep:  Number of Hours: 7.45     Treatment Plan Summary: Daily contact with patient to assess and evaluate symptoms and progress in treatment and Medication management   Mr. Poffenberger is a 41 year old male with a history of schizoaffective disorder admitting for worsening of psychosis in spite of good treatment compliance.  1. Psychosis. We continued clozapine 500 mg nightly for psychosis, Wellbutrin for depression, and lithium for mood stabilization. Lithium level was therapeutic, Clozapine level 762. We tried to add low dose Depakote but the patient developed high ammonia.   2. Anxiety. Resolved. I will discontinue clonazepam.   3. Tooth abscess. He is on clindamycin.  4. Diabetes. He is on Lantus and metformin, ADA diet. Blood glucose is stable.   5. Dyslipidemia. He is on Zocor.  6. Constipation. He is on bowel regimen.  7. History of pulmonary embolism. He is on Coumadin per pharmacy dosing.  8. Insomnia. He is on trazodone.  9. Smoking. Nicotine patch is available.  10. COPD. He is on inhalers.  11. Disposition. He will be discharged back to his group home. He will follow up with [facility] act team.   Orson Slick, MD 03/11/2017, 2:39 PM

## 2017-03-11 NOTE — Progress Notes (Signed)
CBG @ Bedtime=115, PT/INR=21.5 & 1.89, will continue to encourage compliance.

## 2017-03-11 NOTE — [facility] Group Notes (Signed)
LCSW Group Therapy Note 03/11/2017 9:00am  Type of Therapy and Topic:  Group Therapy:  Setting Goals  Participation Level:  Did Not Attend  Description of Group: In this process group, patients discussed using strengths to work toward goals and address challenges.  Patients identified two positive things about themselves and one goal they were working on.  Patients were given the opportunity to share openly and support each other's plan for self-empowerment.  The group discussed the value of gratitude and were encouraged to have a daily reflection of positive characteristics or circumstances.  Patients were encouraged to identify a plan to utilize their strengths to work on current challenges and goals.  Therapeutic Goals 1. Patient will verbalize personal strengths/positive qualities and relate how these can assist with achieving desired personal goals 2. Patients will verbalize affirmation of peers plans for personal change and goal setting 3. Patients will explore the value of gratitude and positive focus as related to successful achievement of goals 4. Patients will verbalize a plan for regular reinforcement of personal positive qualities and circumstances.   Therapeutic Modalities Cognitive Behavioral Therapy Motivational Interviewing    Lance [name] C Alexios Keown, LCSW 03/11/2017 12:37 PM 

## 2017-03-11 NOTE — Plan of Care (Signed)
Problem: Activity: Goal: Sleeping patterns will improve Outcome: Progressing Patient slept for Estimated Hours of 7.45; Precautionary checks every 15 minutes for safety maintained, room free of safety hazards, patient sustains no injury or falls during this shift.     

## 2017-03-11 NOTE — Progress Notes (Signed)
Patient ID: Lance [name], male   DOB: 1975/08/05, 41 y.o.   MRN: [id number] Always grumpy in the morning for VS and Labs despite reminding him of anticipated plan of care; disheveled, poor hygiene, encouraged to take a shower, refused or ignored encouragement; denied SI/HI/AVH during this shift, denied pain.

## 2017-03-11 NOTE — [facility] Group Notes (Signed)
[facility] Group Notes:  (Nursing/MHT/Case Management/Adjunct)  Date:  03/11/2017  Time:  10:43 PM  Type of Therapy:  Evening Wrap-up Group  Participation Level:  Active  Participation Quality:  Appropriate and Attentive  Affect:  Appropriate  Cognitive:  Alert and Appropriate  Insight:  Appropriate, Good and Improving  Engagement in Group:  Developing/Improving and Engaged  Modes of Intervention:  Discussion  Summary of Progress/Problems:  Lance [name] 03/11/2017, 10:43 PM

## 2017-03-11 NOTE — [facility] Group Notes (Signed)
[name] ACT team came to see pt. Lance [name], MSW, LCSW Clinical Social Worker 03/11/2017 3:30 PM

## 2017-03-12 LAB — CBC WITH DIFFERENTIAL/PLATELET
BASOS ABS: 0 10*3/uL (ref 0–0.1)
Basophils Relative: 0 %
Eosinophils Absolute: 0 10*3/uL (ref 0–0.7)
Eosinophils Relative: 0 %
HEMATOCRIT: 42.3 % (ref 40.0–52.0)
HEMOGLOBIN: 14.9 g/dL (ref 13.0–18.0)
LYMPHS ABS: 1.5 10*3/uL (ref 1.0–3.6)
LYMPHS PCT: 29 %
MCH: 32.3 pg (ref 26.0–34.0)
MCHC: 35.2 g/dL (ref 32.0–36.0)
MCV: 91.7 fL (ref 80.0–100.0)
Monocytes Absolute: 0.7 10*3/uL (ref 0.2–1.0)
Monocytes Relative: 14 %
NEUTROS ABS: 3 10*3/uL (ref 1.4–6.5)
NEUTROS PCT: 57 %
PLATELETS: 190 10*3/uL (ref 150–440)
RBC: 4.61 MIL/uL (ref 4.40–5.90)
RDW: 12.9 % (ref 11.5–14.5)
WBC: 5.3 10*3/uL (ref 3.8–10.6)

## 2017-03-12 LAB — GLUCOSE, CAPILLARY: Glucose-Capillary: 120 mg/dL — ABNORMAL HIGH (ref 65–99)

## 2017-03-12 LAB — PROTIME-INR
INR: 1.59
Prothrombin Time: 18.8 seconds — ABNORMAL HIGH (ref 11.4–15.2)

## 2017-03-12 NOTE — Progress Notes (Signed)
Patient called to med room for meds he is quiet and brief with Clinical research associate. He denies si, hi,a vh. He denies pain. He states he is just tired. Plan is to be dc back to group home today. He is alert and oriented. No signs of distress noted.

## 2017-03-12 NOTE — Discharge Summary (Signed)
Physician Discharge Summary Note  Patient:  Lance [name] is an 41 y.o., male MRN:  [id number] DOB:  [birth year] Patient phone:  [phone] (home)  Patient address:   [facility] [address] [place] [address],  Total Time spent with patient: 30 minutes  Date of Admission:  03/02/2017 Date of Discharge: 03/12/2017  Reason for Admission:  Psychotic break.  Identifying data. [name] is a 41 year old male with a history of schizoaffective disorder.  Chief complaint. "I had bad vibes from people."  History of present illness. Information was obtained from the patient and the chart. Mrs. Lance [name] came to the emergency room complaining of increased of irritation, getting bad feelings about other people, and feeling that he is about to lash out. He did not acted out on these feelings but they are very ego-dystonic. The patient is not a violent person. He decided to come to the hospital. The patient denies any symptoms of depression, anxiety or psychosis. He has been stable of on these medications Clozaril, Wellbutrin, and lithium. This is confirmed by therapeutic lithium level. She does not use alcohol or illicit substances. The patient reports that recently she moved to a new group home. It is cleaner, newer, and nicer than the old one. The food is good. Patient was somewhat anxious about the location. He has his room there. Change of group homes was recommended by his act team as well as his guardian.  Psychiatry history. There is a long history of mental illness with multiple hospitalizations and medication trials. Several years ago the patient developed pulmonary embolism while on Risperdal and has been maintained on wife ever since. He responded well to Clozaril. He is in the care of [name] act team.   Family psychiatric history. Unknown.  Social history. The patient is an incompetent adult and in [facility] DSS is the guardian.  Principal Problem:  Schizoaffective disorder, bipolar type [facility]) Discharge Diagnoses: Patient Active Problem List   Diagnosis Date Noted  . Tobacco use disorder [F17.200] 09/01/2016  . Schizoaffective disorder, bipolar type (HCC) [F25.0] 05/22/2016  . Diabetes mellitus without complication (HCC) [E11.9] 05/21/2016  . Hyperlipidemia [E78.5] 05/21/2016  . History of pulmonary embolism [Z86.711] 05/21/2016  . Chronic anticoagulation [Z79.01] 05/21/2016  . Hypertension [I10] 09/25/2015    Past Medical History:  Past Medical History:  Diagnosis Date  . Depression   . Diabetes mellitus without complication (HCC)   . Hyperlipidemia   . Hypertension   . Lupus anticoagulant disorder (HCC) 06/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  . PE (pulmonary thromboembolism) (HCC)   . Schizo affective schizophrenia (HCC)   . Supratherapeutic INR 11/19/2016   History reviewed. No pertinent surgical history. Family History:  Family History  Problem Relation Age of Onset  . CAD Mother   . CAD Sister    Social History:  History  Alcohol Use No    Comment: occassionally     History  Drug Use  . Types: Marijuana    Comment: Last use 11/29/16    Social History   Social History  . Marital status: Single    Spouse name: N/A  . Number of children: N/A  . Years of education: N/A   Social History Main Topics  . Smoking status: Current Every Day Smoker    Packs/day: 1.00    Years: 23.00    Types: Cigarettes  . Smokeless tobacco: Never Used  . Alcohol use No     Comment:  occassionally  . Drug use: Yes    Types: Marijuana     Comment: Last use 11/29/16  . Sexual activity: No     Comment: occasional marijuana- none recently   Other Topics Concern  . None   Social History Narrative   From a group home in [facility] Course:    [name] is a 41 year old male with a history of schizoaffective disorder admitted for worsening of psychosis in spite of good treatment compliance.  1.  Psychosis. We continued clozapine 500 mg nightly for psychosis, Wellbutrin for depression, and lithium for mood stabilization. Lithium level 0.8, Clozapine level 762. We tried to add low dose Depakote but the patient developed high ammonia.   2. Anxiety. Resolved.   3. Tooth abscess. He had a course of clindamycin.  4. Diabetes. He is on Lantus and metformin, ADA diet. Blood glucose is stable.   5. Dyslipidemia. He is on Zocor.  6. Constipation. He is on bowel regimen.  7. History of pulmonary embolism. We continued Coumadin.   8. Insomnia. He is on trazodone.  9. Smoking. Nicotine patch is available.  10. COPD. He is on inhalers.  11. Disposition. He was discharged back to his group home. He will follow up with [facility] act team.  Physical Findings: AIMS:  , ,  ,  ,    CIWA:    COWS:     Musculoskeletal: Strength & Muscle Tone: within normal limits Gait & Station: normal Patient leans: N/A  Psychiatric Specialty Exam: Physical Exam  Nursing note and vitals reviewed. Psychiatric: He has a normal mood and affect. His speech is normal and behavior is normal. Judgment and thought content normal. Cognition and memory are normal.    Review of Systems  Constitutional: Negative.   HENT: Negative.   Eyes: Negative.   Respiratory: Negative.   Cardiovascular: Negative.   Gastrointestinal: Negative.   Genitourinary: Negative.   Musculoskeletal: Negative.   Skin: Negative.   Neurological: Negative.   Endo/Heme/Allergies: Negative.   Psychiatric/Behavioral: Negative.     Blood pressure 120/64, pulse 72, temperature 97.8 F (36.6 C), temperature source Oral, resp. rate 20, height  (1.778 m), weight 86.2 kg (190 lb), SpO2 100 %.Body mass index is 27.26 kg/m.  General Appearance: Casual  Eye Contact:  Good  Speech:  Clear and Coherent  Volume:  Normal  Mood:  Euthymic  Affect:  Appropriate  Thought Process:  Goal Directed and Descriptions of  Associations: Intact  Orientation:  Full (Time, Place, and Person)  Thought Content:  WDL  Suicidal Thoughts:  No  Homicidal Thoughts:  No  Memory:  Immediate;   Fair Recent;   Fair Remote;   Fair  Judgement:  Fair  Insight:  Fair  Psychomotor Activity:  Normal  Concentration:  Concentration: Fair and Attention Span: Fair  Recall:  [employer] of Knowledge:  Fair  Language:  Fair  Akathisia:  No  Handed:  Right  AIMS (if indicated):     Assets:  Communication Skills Desire for Improvement Financial Resources/Insurance Housing Resilience Social Support  ADL's:  Intact  Cognition:  WNL  Sleep:  Number of Hours: 7.45     Have you used any form of tobacco in the last 30 days? (Cigarettes, Smokeless Tobacco, Cigars, and/or Pipes): Yes  Has this patient used any form of tobacco in the last 30 days? (Cigarettes, Smokeless Tobacco, Cigars, and/or Pipes) Yes, Yes, A prescription for an FDA-approved tobacco cessation medication was  offered at discharge and the patient refused  Blood Alcohol level:  Lab Results  Component Value Date   [facility] <5 02/26/2017   ETH <5 12/27/2016    Metabolic Disorder Labs:  Lab Results  Component Value Date   HGBA1C 5.3 03/03/2017   MPG 105.41 03/03/2017   MPG 111 12/30/2016   Lab Results  Component Value Date   PROLACTIN 2.0 (L) 08/31/2016   PROLACTIN 4.8 05/22/2016   Lab Results  Component Value Date   CHOL 131 03/03/2017   TRIG 207 (H) 03/03/2017   HDL 31 (L) 03/03/2017   CHOLHDL 4.2 03/03/2017   VLDL 41 (H) 03/03/2017   LDLCALC 59 03/03/2017   LDLCALC 59 12/30/2016    See Psychiatric Specialty Exam and Suicide Risk Assessment completed by Attending Physician prior to discharge.  Discharge destination:  Home  Is patient on multiple antipsychotic therapies at discharge:  No   Has Patient had three or more failed trials of antipsychotic monotherapy by history:  No  Recommended Plan for Multiple Antipsychotic  Therapies: NA  Discharge Instructions    Diet - low sodium heart healthy    Complete by:  As directed    Increase activity slowly    Complete by:  As directed      Allergies as of 03/12/2017      Reactions   Penicillins Other (See Comments)   Reaction: "lockjaw" Has patient had a PCN reaction causing immediate rash, facial/tongue/throat swelling, SOB or lightheadedness with hypotension: No Has patient had a PCN reaction causing severe rash involving mucus membranes or skin necrosis: No Has patient had a PCN reaction that required hospitalization: No Has patient had a PCN reaction occurring within the last 10 years: No If all of the above answers are "NO", then may proceed with Cephalosporin use.      Medication List    TAKE these medications     Indication  albuterol 108 (90 Base) MCG/ACT inhaler Commonly known as:  PROVENTIL HFA;VENTOLIN HFA Inhale 1-2 puffs into the lungs every 4 (four) hours as needed for wheezing or shortness of breath.  Indication:  Asthma   buPROPion 150 MG 24 hr tablet Commonly known as:  WELLBUTRIN XL Take 1 tablet (150 mg total) by mouth daily.  Indication:  Major Depressive Disorder   cloZAPine 100 MG tablet Commonly known as:  CLOZARIL Take 5 tablets (500 mg total) by mouth at bedtime.  Indication:  Schizophrenia   docusate sodium 100 MG capsule Commonly known as:  COLACE Take 2 capsules (200 mg total) by mouth 2 (two) times daily.  Indication:  Constipation   Fluticasone-Salmeterol 250-50 MCG/DOSE Aepb Commonly known as:  ADVAIR Inhale 1 puff into the lungs 2 (two) times daily.  Indication:  Asthma   insulin detemir 100 UNIT/ML injection Commonly known as:  LEVEMIR Inject 14 Units into the skin every morning.  Indication:  Type 2 Diabetes   ipratropium 0.06 % nasal spray Commonly known as:  ATROVENT Place 1 spray into both nostrils at bedtime.  Indication:  Runny Nose associated with Hayfever   lisinopril 2.5 MG tablet Commonly  known as:  PRINIVIL,ZESTRIL Take 2.5 mg by mouth daily.  Indication:  High Blood Pressure Disorder   lithium carbonate 450 MG CR tablet Commonly known as:  ESKALITH Take 1 tablet (450 mg total) by mouth every 12 (twelve) hours.  Indication:  Manic-Depression   metformin 500 MG (OSM) 24 hr tablet Commonly known as:  FORTAMET Take 500 mg by mouth 2 (two)  times daily with a meal.  Indication:  Type 2 Diabetes   mometasone-formoterol 200-5 MCG/ACT Aero Commonly known as:  DULERA Inhale 2 puffs into the lungs 2 (two) times daily.  Indication:  Asthma   omega-3 acid ethyl esters 1 g capsule Commonly known as:  LOVAZA Take 1 g by mouth daily.  Indication:  High Amount of Triglycerides in the Blood   polyethylene glycol packet Commonly known as:  MIRALAX / GLYCOLAX Take 17 g by mouth daily.  Indication:  Constipation   simvastatin 40 MG tablet Commonly known as:  ZOCOR Take 40 mg by mouth at bedtime.  Indication:  High Amount of Fats in the Blood   traZODone 50 MG tablet Commonly known as:  DESYREL Take 75 mg by mouth at bedtime.  Indication:  Trouble Sleeping   warfarin 10 MG tablet Commonly known as:  COUMADIN Take 10 mg by mouth daily.  Indication:  Blockage of Blood Vessel to Lung by a Particle   warfarin 5 MG tablet Commonly known as:  COUMADIN Take 1 tablet (5 mg total) by mouth daily at 6 PM.  Indication:  Blockage of Blood Vessel to Lung by a Particle      Follow-up Information    [name] [place] Go on 03/15/2017.   Why:  Please attend your medication appointment on Monday, October 1, at 11:30am.  ACT team services will resume at that time. Contact information: [address] [place] [address] [phone]           Follow-up recommendations:  Activity:  as tolerated. Diet:  low sodium heart healthy ADA diet. Other:  keep follow up appointments.  Comments:    Signed: Kristine Linea, MD 03/12/2017,  8:38 AM

## 2017-03-12 NOTE — Plan of Care (Signed)
Problem: Safety: Goal: Ability to remain free from injury will improve Outcome: Progressing Patient is safe with out harm to self and others and contract safety with staff.

## 2017-03-12 NOTE — Plan of Care (Signed)
Problem: Coping: Goal: Ability to cope will improve Outcome: Progressing Patient is cooperating with care assessments and ADLs, participate in activities with peers, attends group and very assertive.

## 2017-03-12 NOTE — NC FL2 (Signed)
[place] MEDICAID FL2 LEVEL OF CARE SCREENING TOOL     IDENTIFICATION  Patient Name: Lance [name] Birthdate: 11-23-1975 Sex: male Admission Date (Current Location): 03/02/2017  [place] and [place] Number:  [name] [id number] [facility] Facility and Address:  [facility], [address], [place], [place] [address]      Provider Number: [id number]  Attending Physician Name and Address:  Shari Prows, MD  Relative Name and Phone Number:  [name], Legal Guardian, [phone]    Current Level of Care: Hospital Recommended Level of Care: Family Care Home Prior Approval Number:    Date Approved/Denied:   PASRR Number:    Discharge Plan: Other (Comment) (Family Care Home)    Current Diagnoses: Patient Active Problem List   Diagnosis Date Noted  . Tobacco use disorder 09/01/2016  . Schizoaffective disorder, bipolar type (HCC) 05/22/2016  . Diabetes mellitus without complication (HCC) 05/21/2016  . Hyperlipidemia 05/21/2016  . History of pulmonary embolism 05/21/2016  . Chronic anticoagulation 05/21/2016  . Hypertension 09/25/2015    Orientation RESPIRATION BLADDER Height & Weight     Self, Time, Situation, Place  Normal Continent Weight: 190 lb (86.2 kg) Height:   (177.8 cm)  BEHAVIORAL SYMPTOMS/MOOD NEUROLOGICAL BOWEL NUTRITION STATUS   (na)  (na) Continent  (na)  AMBULATORY STATUS COMMUNICATION OF NEEDS Skin   Independent Verbally Normal                       Personal Care Assistance Level of Assistance   (na)           Functional Limitations Info   (na)          SPECIAL CARE FACTORS FREQUENCY   (na)                    Contractures Contractures Info: Not present    Additional Factors Info   (na)               Current Medications (03/12/2017):  This is the current hospital active medication list Current Facility-Administered Medications  Medication Dose Route Frequency Provider Last Rate  Last Dose  . acetaminophen (TYLENOL) tablet 650 mg  650 mg Oral Q6H PRN Clapacs, [name], MD      . albuterol (PROVENTIL HFA;VENTOLIN HFA) 108 (90 Base) MCG/ACT inhaler 1-2 puff  1-2 puff Inhalation Q4H PRN Clapacs, [name], MD   2 puff at 03/09/17 2118  . alum & mag hydroxide-simeth (MAALOX/MYLANTA) 200-200-20 MG/5ML suspension 30 mL  30 mL Oral Q4H PRN Clapacs, [name], MD      . buPROPion (WELLBUTRIN XL) 24 hr tablet 150 mg  150 mg Oral Daily Pucilowska, Jolanta B, MD   150 mg at 03/12/17 0855  . cloZAPine (CLOZARIL) tablet 500 mg  500 mg Oral QHS Clapacs, [name], MD   500 mg at 03/11/17 2118  . docusate sodium (COLACE) capsule 200 mg  200 mg Oral BID Clapacs, [name], MD   200 mg at 03/12/17 0855  . ibuprofen (ADVIL,MOTRIN) tablet 600 mg  600 mg Oral Q8H PRN Clapacs, [name], MD   600 mg at 03/08/17 1717  . insulin detemir (LEVEMIR) injection 14 Units  14 Units Subcutaneous Bryn Gulling, [name], MD   14 Units at 03/12/17 [phone]  . ipratropium (ATROVENT) 0.06 % nasal spray 1 spray  1 spray Each Nare QHS Clapacs, [name], MD   1 spray at 03/11/17 2123  . lithium carbonate (ESKALITH)  CR tablet 450 mg  450 mg Oral Q12H Clapacs, [name], MD   450 mg at 03/12/17 0855  . magnesium hydroxide (MILK OF MAGNESIA) suspension 30 mL  30 mL Oral Daily PRN Clapacs, [name], MD      . metFORMIN (GLUCOPHAGE-XR) 24 hr tablet 500 mg  500 mg Oral BID WC Clapacs, [name], MD   500 mg at 03/12/17 0855  . mometasone-formoterol (DULERA) 200-5 MCG/ACT inhaler 2 puff  2 puff Inhalation BID Clapacs, [name], MD   2 puff at 03/12/17 [phone]  . nicotine (NICODERM CQ - dosed in mg/24 hours) patch 21 mg  21 mg Transdermal Daily Clapacs, [name], MD   21 mg at 03/03/17 0900  . polyethylene glycol (MIRALAX / GLYCOLAX) packet 17 g  17 g Oral Daily Clapacs, [name], MD   17 g at 03/12/17 0856  . simvastatin (ZOCOR) tablet 40 mg  40 mg Oral QHS Clapacs, [name], MD   40 mg at 03/11/17 2119  . traZODone (DESYREL) tablet 150 mg  150 mg Oral QHS  Pucilowska, Jolanta B, MD   150 mg at 03/11/17 2119  . warfarin (COUMADIN) tablet 5 mg  5 mg Oral q1800 Clapacs, [name], MD   5 mg at 03/11/17 1716  . Warfarin - Physician Dosing Inpatient   Does not apply q1800 Clapacs, [name], MD         Discharge Medications: Please see discharge summary for a list of discharge medications.  Relevant Imaging Results:  Relevant Lab Results:   Additional Information na  Lorri Frederick, LCSW

## 2017-03-12 NOTE — Progress Notes (Signed)
CSW spoke to legal guardian [name] and informed her of pt discharge.  She said she had tried to fax signed releases yesterday but it did not go through.  CSW provided fax number and she said she will fax shortly.  CSW confirmed on 03/11/17 with group home, [facility], that they can pick pt up at 10am.  Garner Nash, MSW, LCSW Clinical Social Worker 03/12/2017 8:29 AM

## 2017-03-12 NOTE — Progress Notes (Signed)
  [facility] Adult Case Management Discharge Plan :  Will you be returning to the same living situation after discharge:  Yes,  group home At discharge, do you have transportation home?: Yes,  group home Do you have the ability to pay for your medications: Yes,  medicare  Release of information consent forms completed and in the chart;  Patient's signature needed at discharge.  Patient to Follow up at: Follow-up Information    [address] [facility] [place] [employer] Go on 03/15/2017.   Why:  Please attend your medication appointment on Monday, October 1, at 11:30am.  ACT team services will resume at that time. Contact information: [address] [place] [address] [place] [address] [phone]           Next level of care provider has access to [facility] Link:no  Safety Planning and Suicide Prevention discussed: No.  Have you used any form of tobacco in the last 30 days? (Cigarettes, Smokeless Tobacco, Cigars, and/or Pipes): Yes  Has patient been referred to the Quitline?: Patient refused referral  Patient has been referred for addiction treatment: Yes  Lorri Frederick, LCSW 03/12/2017, 10:14 AM

## 2017-03-12 NOTE — Progress Notes (Signed)
Pharmacy Consult for Clozapine Monitoring  Patient ordered  clozapine at bedtime.  9/14  ANC= 4.2 9/21 ANC = 4 9/28 ANC = 3.0  Labs submitted to Clozapine REMS program and patient eligible to receive clozapine. Check labs weekly while inpatient. Next lab on 10/5.  Marty Heck, PharmD, BCPS Clinical Pharmacist 03/12/2017

## 2017-03-12 NOTE — Plan of Care (Signed)
Problem: Activity: Goal: Sleeping patterns will improve Outcome: Progressing Patient is sleeping more than 7 at night and rest quietly.without issues

## 2017-03-12 NOTE — BHH Suicide Risk Assessment (Signed)
[facility] Discharge Suicide Risk Assessment   Principal Problem: Schizoaffective disorder, bipolar type [facility]) Discharge Diagnoses:  Patient Active Problem List   Diagnosis Date Noted  . Tobacco use disorder [F17.200] 09/01/2016  . Schizoaffective disorder, bipolar type (HCC) [F25.0] 05/22/2016  . Diabetes mellitus without complication (HCC) [E11.9] 05/21/2016  . Hyperlipidemia [E78.5] 05/21/2016  . History of pulmonary embolism [Z86.711] 05/21/2016  . Chronic anticoagulation [Z79.01] 05/21/2016  . Hypertension [I10] 09/25/2015    Total Time spent with patient: 30 minutes  Musculoskeletal: Strength & Muscle Tone: within normal limits Gait & Station: normal Patient leans: N/A  Psychiatric Specialty Exam: Review of Systems  Constitutional: Negative.   HENT: Negative.   Eyes: Negative.   Respiratory: Negative.   Cardiovascular: Negative.   Gastrointestinal: Negative.   Genitourinary: Negative.   Musculoskeletal: Negative.   Skin: Negative.   Neurological: Negative.   Endo/Heme/Allergies: Negative.   Psychiatric/Behavioral: Negative.     Blood pressure 120/64, pulse 72, temperature 97.8 F (36.6 C), temperature source Oral, resp. rate 20, height  (1.778 m), weight 86.2 kg (190 lb), SpO2 100 %.Body mass index is 27.26 kg/m.  General Appearance: Casual  Eye Contact::  Good  Speech:  Clear and Coherent409  Volume:  Normal  Mood:  Euthymic  Affect:  Blunt  Thought Process:  Goal Directed and Descriptions of Associations: Intact  Orientation:  Full (Time, Place, and Person)  Thought Content:  WDL  Suicidal Thoughts:  No  Homicidal Thoughts:  No  Memory:  Immediate;   Fair Recent;   Fair Remote;   Fair  Judgement:  Fair  Insight:  Fair  Psychomotor Activity:  Decreased  Concentration:  Fair  Recall:  [employer] of Knowledge:Fair  Language: Fair  Akathisia:  No  Handed:  Right  AIMS (if indicated):     Assets:  Communication Skills Desire for  Improvement Financial Resources/Insurance Housing Resilience Social Support  Sleep:  Number of Hours: 7.45  Cognition: WNL  ADL's:  Intact   Mental Status Per Nursing Assessment::   On Admission:     Demographic Factors:  Male and Caucasian  Loss Factors: NA  Historical Factors: Impulsivity  Risk Reduction Factors:   Living with another person, especially a relative, Positive social support and Positive therapeutic relationship  Continued Clinical Symptoms:  Schizophrenia:   Paranoid or undifferentiated type  Cognitive Features That Contribute To Risk:  None    Suicide Risk:  Minimal: No identifiable suicidal ideation.  Patients presenting with no risk factors but with morbid ruminations; may be classified as minimal risk based on the severity of the depressive symptoms  Follow-up Information    [name] [facility] [place] Go on 03/15/2017.   Why:  Please attend your medication appointment on Monday, October 1, at 11:30am.  ACT team services will resume at that time. Contact information: [address] [place] [address] [phone]           Plan Of Care/Follow-up recommendations:  Activity:  as tolerated. Diet:  low sodium heart healthy ADA diet. Other:  keep follow up appointments.  Kristine Linea, MD 03/12/2017, 8:38 AM

## 2017-03-13 ENCOUNTER — Emergency Department: Payer: Medicare Other

## 2017-03-13 ENCOUNTER — Encounter: Payer: Self-pay | Admitting: Emergency Medicine

## 2017-03-13 ENCOUNTER — Emergency Department
Admission: EM | Admit: 2017-03-13 | Discharge: 2017-03-14 | Disposition: A | Discharge status: Home / Self Care | Payer: Medicare Other | Attending: Emergency Medicine | Admitting: Emergency Medicine

## 2017-03-13 DIAGNOSIS — F912 Conduct disorder, adolescent-onset type: Secondary | ICD-10-CM | POA: Diagnosis not present

## 2017-03-13 DIAGNOSIS — E119 Type 2 diabetes mellitus without complications: Secondary | ICD-10-CM | POA: Diagnosis not present

## 2017-03-13 DIAGNOSIS — Z86711 Personal history of pulmonary embolism: Secondary | ICD-10-CM | POA: Diagnosis present

## 2017-03-13 DIAGNOSIS — F250 Schizoaffective disorder, bipolar type: Secondary | ICD-10-CM | POA: Diagnosis present

## 2017-03-13 DIAGNOSIS — F259 Schizoaffective disorder, unspecified: Secondary | ICD-10-CM | POA: Diagnosis present

## 2017-03-13 DIAGNOSIS — F29 Unspecified psychosis not due to a substance or known physiological condition: Secondary | ICD-10-CM | POA: Insufficient documentation

## 2017-03-13 DIAGNOSIS — Z7984 Long term (current) use of oral hypoglycemic drugs: Secondary | ICD-10-CM | POA: Diagnosis not present

## 2017-03-13 DIAGNOSIS — F17210 Nicotine dependence, cigarettes, uncomplicated: Secondary | ICD-10-CM | POA: Diagnosis not present

## 2017-03-13 DIAGNOSIS — I10 Essential (primary) hypertension: Secondary | ICD-10-CM | POA: Diagnosis not present

## 2017-03-13 DIAGNOSIS — F319 Bipolar disorder, unspecified: Secondary | ICD-10-CM | POA: Diagnosis present

## 2017-03-13 DIAGNOSIS — Z79899 Other long term (current) drug therapy: Secondary | ICD-10-CM | POA: Insufficient documentation

## 2017-03-13 DIAGNOSIS — R4689 Other symptoms and signs involving appearance and behavior: Secondary | ICD-10-CM

## 2017-03-13 DIAGNOSIS — Z7901 Long term (current) use of anticoagulants: Secondary | ICD-10-CM | POA: Insufficient documentation

## 2017-03-13 DIAGNOSIS — R0602 Shortness of breath: Secondary | ICD-10-CM | POA: Diagnosis not present

## 2017-03-13 DIAGNOSIS — I4510 Unspecified right bundle-branch block: Secondary | ICD-10-CM | POA: Diagnosis not present

## 2017-03-13 DIAGNOSIS — E785 Hyperlipidemia, unspecified: Secondary | ICD-10-CM | POA: Diagnosis present

## 2017-03-13 DIAGNOSIS — R4589 Other symptoms and signs involving emotional state: Secondary | ICD-10-CM | POA: Diagnosis not present

## 2017-03-13 LAB — CBC
HEMATOCRIT: 45.6 % (ref 40.0–52.0)
HEMOGLOBIN: 15.9 g/dL (ref 13.0–18.0)
MCH: 31.6 pg (ref 26.0–34.0)
MCHC: 35 g/dL (ref 32.0–36.0)
MCV: 90.4 fL (ref 80.0–100.0)
Platelets: 236 10*3/uL (ref 150–440)
RBC: 5.04 MIL/uL (ref 4.40–5.90)
RDW: 13 % (ref 11.5–14.5)
WBC: 6.3 10*3/uL (ref 3.8–10.6)

## 2017-03-13 LAB — URINE DRUG SCREEN, QUALITATIVE (ARMC ONLY)
AMPHETAMINES, UR SCREEN: NOT DETECTED
BENZODIAZEPINE, UR SCRN: NOT DETECTED
Barbiturates, Ur Screen: NOT DETECTED
COCAINE METABOLITE, UR ~~LOC~~: NOT DETECTED
Cannabinoid 50 Ng, Ur ~~LOC~~: NOT DETECTED
MDMA (Ecstasy)Ur Screen: NOT DETECTED
Methadone Scn, Ur: NOT DETECTED
OPIATE, UR SCREEN: NOT DETECTED
PHENCYCLIDINE (PCP) UR S: NOT DETECTED
TRICYCLIC, UR SCREEN: NOT DETECTED

## 2017-03-13 LAB — COMPREHENSIVE METABOLIC PANEL
ALBUMIN: 4.9 g/dL (ref 3.5–5.0)
ALT: 73 U/L — ABNORMAL HIGH (ref 17–63)
ANION GAP: 11 (ref 5–15)
AST: 46 U/L — ABNORMAL HIGH (ref 15–41)
Alkaline Phosphatase: 58 U/L (ref 38–126)
BUN: 19 mg/dL (ref 6–20)
CALCIUM: 9.5 mg/dL (ref 8.9–10.3)
CHLORIDE: 108 mmol/L (ref 101–111)
CO2: 23 mmol/L (ref 22–32)
Creatinine, Ser: 0.98 mg/dL (ref 0.61–1.24)
GFR calc non Af Amer: >60 mL/min (ref >60)
GLUCOSE: 176 mg/dL — AB (ref 65–99)
Potassium: 4.2 mmol/L (ref 3.5–5.1)
SODIUM: 142 mmol/L (ref 135–145)
Total Bilirubin: 0.7 mg/dL (ref 0.3–1.2)
Total Protein: 7.3 g/dL (ref 6.5–8.1)

## 2017-03-13 LAB — ACETAMINOPHEN LEVEL

## 2017-03-13 LAB — ETHANOL: Alcohol, Ethyl (B): <10 mg/dL (ref <10)

## 2017-03-13 LAB — SALICYLATE LEVEL

## 2017-03-13 LAB — GLUCOSE, CAPILLARY: GLUCOSE-CAPILLARY: 99 mg/dL (ref 65–99)

## 2017-03-13 MED ORDER — WARFARIN SODIUM 5 MG PO TABS
5.0000 mg | ORAL_TABLET | Freq: Every day | ORAL | Status: DC
  Filled 2017-03-13: qty 1

## 2017-03-13 MED ORDER — DOCUSATE SODIUM 100 MG PO CAPS
200.0000 mg | ORAL_CAPSULE | Freq: Two times a day (BID) | ORAL | Status: DC
  Administered 2017-03-13 – 2017-03-14 (×2): 200 mg via ORAL
  Filled 2017-03-13 (×3): qty 2

## 2017-03-13 MED ORDER — METFORMIN HCL ER 500 MG PO TB24
500.0000 mg | ORAL_TABLET | Freq: Two times a day (BID) | ORAL | Status: DC
Start: 2017-03-14 — End: 2017-03-14
  Administered 2017-03-14 (×2): 500 mg via ORAL
  Filled 2017-03-13 (×4): qty 1

## 2017-03-13 MED ORDER — OMEGA-3-ACID ETHYL ESTERS 1 G PO CAPS
1.0000 g | ORAL_CAPSULE | Freq: Every day | ORAL | Status: DC
  Administered 2017-03-13 – 2017-03-14 (×2): 1 g via ORAL
  Filled 2017-03-13 (×3): qty 1

## 2017-03-13 MED ORDER — ALBUTEROL SULFATE HFA 108 (90 BASE) MCG/ACT IN AERS
1.0000 | INHALATION_SPRAY | RESPIRATORY_TRACT | Status: DC | PRN
  Filled 2017-03-13: qty 6.7

## 2017-03-13 MED ORDER — LISINOPRIL 5 MG PO TABS
ORAL_TABLET | ORAL | Status: AC
  Filled 2017-03-13: qty 1

## 2017-03-13 MED ORDER — MOMETASONE FURO-FORMOTEROL FUM 200-5 MCG/ACT IN AERO
2.0000 | INHALATION_SPRAY | Freq: Two times a day (BID) | RESPIRATORY_TRACT | Status: DC
  Administered 2017-03-13 – 2017-03-14 (×2): 2 via RESPIRATORY_TRACT
  Filled 2017-03-13: qty 8.8

## 2017-03-13 MED ORDER — LISINOPRIL 5 MG PO TABS
2.5000 mg | ORAL_TABLET | Freq: Every day | ORAL | Status: DC
  Administered 2017-03-13 – 2017-03-14 (×2): 2.5 mg via ORAL
  Filled 2017-03-13: qty 0.5

## 2017-03-13 MED ORDER — CLOZAPINE 100 MG PO TABS
500.0000 mg | ORAL_TABLET | Freq: Every day | ORAL | Status: DC
  Administered 2017-03-13: 500 mg via ORAL
  Filled 2017-03-13: qty 5

## 2017-03-13 MED ORDER — BUPROPION HCL ER (XL) 150 MG PO TB24
150.0000 mg | ORAL_TABLET | Freq: Every day | ORAL | Status: DC
  Administered 2017-03-13 – 2017-03-14 (×2): 150 mg via ORAL
  Filled 2017-03-13: qty 1

## 2017-03-13 MED ORDER — SIMVASTATIN 40 MG PO TABS
40.0000 mg | ORAL_TABLET | Freq: Every day | ORAL | Status: DC
Start: 2017-03-13 — End: 2017-03-14
  Administered 2017-03-13: 40 mg via ORAL
  Filled 2017-03-13: qty 1

## 2017-03-13 MED ORDER — POLYETHYLENE GLYCOL 3350 17 G PO PACK
17.0000 g | PACK | Freq: Every day | ORAL | Status: DC
  Administered 2017-03-14: 17 g via ORAL
  Filled 2017-03-13 (×2): qty 1

## 2017-03-13 MED ORDER — INSULIN DETEMIR 100 UNIT/ML ~~LOC~~ SOLN
14.0000 [IU] | SUBCUTANEOUS | Status: DC
  Administered 2017-03-14: 14 [IU] via SUBCUTANEOUS
  Filled 2017-03-13 (×3): qty 0.14

## 2017-03-13 MED ORDER — MOMETASONE FURO-FORMOTEROL FUM 200-5 MCG/ACT IN AERO: 2.0000 | INHALATION_SPRAY | Freq: Two times a day (BID) | RESPIRATORY_TRACT | Status: DC

## 2017-03-13 MED ORDER — BUPROPION HCL ER (XL) 150 MG PO TB24
ORAL_TABLET | ORAL | Status: AC
  Filled 2017-03-13: qty 1

## 2017-03-13 MED ORDER — TRAZODONE HCL 50 MG PO TABS
75.0000 mg | ORAL_TABLET | Freq: Every day | ORAL | Status: DC
Start: 2017-03-13 — End: 2017-03-14
  Administered 2017-03-13: 75 mg via ORAL
  Filled 2017-03-13: qty 2

## 2017-03-13 MED ORDER — LITHIUM CARBONATE ER 450 MG PO TBCR
450.0000 mg | EXTENDED_RELEASE_TABLET | Freq: Two times a day (BID) | ORAL | Status: DC
  Administered 2017-03-13 – 2017-03-14 (×2): 450 mg via ORAL
  Filled 2017-03-13 (×2): qty 1

## 2017-03-13 MED ORDER — IPRATROPIUM BROMIDE 0.06 % NA SOLN
1.0000 | Freq: Every day | NASAL | Status: DC
  Administered 2017-03-13: 1 via NASAL
  Filled 2017-03-13: qty 15

## 2017-03-13 MED ORDER — WARFARIN SODIUM 10 MG PO TABS
10.0000 mg | ORAL_TABLET | Freq: Every day | ORAL | Status: AC
  Administered 2017-03-13: 10 mg via ORAL
  Filled 2017-03-13: qty 1

## 2017-03-13 NOTE — ED Notes (Signed)
Report was received from Dorise Hiss., RN; Pt. Verbalizes  complaints of dreaming of bad spirits; wanting to hurt other people; and distress of worrying about having his belongings; with stating, "that's all I got; I got to have them."  denies S.I./Hi. Continue to monitor with 15 min. Monitoring.

## 2017-03-13 NOTE — ED Notes (Signed)
Patient received PM snack. 

## 2017-03-13 NOTE — ED Notes (Signed)
Patient is IVC and is pending SOC consult. 
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
## 2017-03-13 NOTE — ED Notes (Signed)
Report given to Margaret, RN in the ED BHU.  

## 2017-03-13 NOTE — ED Triage Notes (Signed)
Pt to ed with c/o "I have been feeling upset lately,  I want to hurt other people and when I try to sleep an night, bad spirits come to me.  I am going to be honest, I do want to hurt people, especially in a crowd of people, it makes me anxious and upset"

## 2017-03-13 NOTE — ED Notes (Signed)
Pt was given a meal tray and beverage. Pt ate 100% of meal. Pt calm and cooperative with staff. Nothing needed from staff at this time.

## 2017-03-13 NOTE — ED Notes (Signed)
Pt returned from Xray at this time  

## 2017-03-13 NOTE — ED Notes (Signed)
BEHAVIORAL HEALTH ROUNDING  Patient sleeping: No.  Patient alert and oriented: yes  Behavior appropriate: Yes. ; If no, describe:  Nutrition and fluids offered: Yes  Toileting and hygiene offered: Yes  Sitter present: not applicable, Q 15 min safety rounds and observation.  Law enforcement present: Yes ODS  

## 2017-03-13 NOTE — ED Notes (Signed)
ED BHU PLACEMENT JUSTIFICATION Is the patient under IVC or is there intent for IVC: Yes.   Is the patient medically cleared: Yes.   Is there vacancy in the ED BHU: Yes.   Is the population mix appropriate for patient: Yes.   Is the patient awaiting placement in inpatient or outpatient setting: Yes.   Has the patient had a psychiatric consult: Yes.   Survey of unit performed for contraband, proper placement and condition of furniture, tampering with fixtures in bathroom, shower, and each patient room: Yes.   APPEARANCE/BEHAVIOR cooperative and adequate rapport can be established NEURO ASSESSMENT Orientation: time, place and person Hallucinations: Yes.  Auditory Hallucinations Speech: Normal Gait: normal RESPIRATORY ASSESSMENT Normal expansion.  Clear to auscultation.  No rales, rhonchi, or wheezing. CARDIOVASCULAR ASSESSMENT regular rate and rhythm, S1, S2 normal, no murmur, click, rub or gallop GASTROINTESTINAL ASSESSMENT soft, nontender, BS WNL, no r/g EXTREMITIES normal strength, tone, and muscle mass PLAN OF CARE Provide calm/safe environment. Vital signs assessed twice daily. ED BHU Assessment once each 12-hour shift. Collaborate with intake RN daily or as condition indicates. Assure the ED provider has rounded once each shift. Provide and encourage hygiene. Provide redirection as needed. Assess for escalating behavior; address immediately and inform ED provider.  Assess family dynamic and appropriateness for visitation as needed: Yes.   Educate the patient/family about BHU procedures/visitation: Yes.
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
## 2017-03-13 NOTE — ED Notes (Signed)

## 2017-03-14 DIAGNOSIS — F250 Schizoaffective disorder, bipolar type: Secondary | ICD-10-CM

## 2017-03-14 DIAGNOSIS — R4589 Other symptoms and signs involving emotional state: Secondary | ICD-10-CM | POA: Diagnosis not present

## 2017-03-14 LAB — GLUCOSE, CAPILLARY
GLUCOSE-CAPILLARY: 172 mg/dL — AB (ref 65–99)
Glucose-Capillary: 78 mg/dL (ref 65–99)
Glucose-Capillary: 126 mg/dL — ABNORMAL HIGH (ref 65–99)

## 2017-03-14 NOTE — ED Notes (Signed)
Patient alert and oriented. Patient voices no concerns. Patient states he is here because he was feeling HI against people in his group home. Patient states he has thoughts of SI but can contract for safety. Patient denies A/V/H. Patient with Q 15 minute checks in progress and he remains safe on unit. Monitoring of patient continues.

## 2017-03-14 NOTE — ED Notes (Signed)
Patient taking shower and bed linen changed.

## 2017-03-14 NOTE — ED Provider Notes (Signed)
-----------------------------------------   4:15 PM on 03/14/2017 -----------------------------------------  Case discussed with Dr. Toni Amend to his evaluated the patient and is rescinding commitment. He recommends discharge home.  Patient's workup is negative. We will discharge at this time.   Dionne Bucy, MD 03/14/17 [phone]

## 2017-03-14 NOTE — ED Notes (Addendum)
Patient discharged to group home and patient voiced understanding of discharge instructions. AVS reviewed and written copy given to care giver of group home. Patient vitals 131/85-88-20-100% room air. Patient provided crisis number and written copy given of number. Patient without questions. Escorted to lobby by staff to meet group home staff. Patient did not voiced any SI/HI and A/V/H at discharged when asked. All patient belongings returned to him at discharge.

## 2017-03-14 NOTE — BHH Counselor (Signed)
Writer attempted to contact Legal Guardian [name] @ [phone] left message informing her patient was being discharged today and transportation arrangements needed to be made; left TTS phone number  Attempted to call [phone] but disconnected... Dorothe Elmore F. [name], LPC

## 2017-03-14 NOTE — Discharge Instructions (Signed)
Return to the ER for any new or worsening symptoms, any thoughts of wanting to hurt yourself or others, or any other symptoms that concern you.

## 2017-03-14 NOTE — Consult Note (Signed)
[facility] Consult   Reason for Consult:  Consult for 41 year old man with schizophrenia who came back to the emergency room having made some statements about being suicidal or homicidal. Referring Physician:  Corky Downs Patient Identification: [name] MRN:  [id number] Principal Diagnosis: Schizoaffective disorder, bipolar type [facility]) Diagnosis:   Patient Active Problem List   Diagnosis Date Noted  . Tobacco use disorder [F17.200] 09/01/2016  . Schizoaffective disorder, bipolar type ([name]) [F25.0] 05/22/2016  . Diabetes mellitus without complication ([name]) [D40.8] 05/21/2016  . Hyperlipidemia [E78.5] 05/21/2016  . History of pulmonary embolism [Z86.711] 05/21/2016  . Chronic anticoagulation [Z79.01] 05/21/2016  . Hypertension [I10] 09/25/2015    Total Time spent with patient: 45 minutes  Subjective:   [name] is a 41 y.o. male patient admitted with "I just had a bad night".  HPI:  Patient interviewed chart reviewed. Patient well known from prior encounters. 41 year old man with schizophrenia. Just discharged from the hospital 2 days ago. He said the night he got home he slept very poorly. Woke up several times during the night feeling "haunted". He did not actually injure himself or injure anyone else. On interview today the patient says he no longer has any suicidal or homicidal thoughts. Still has occasional hallucinations but is managing them. Patient is calm and polite and not expressing any aggression. No acute behavior problems.  Social history: Patient has a guardian and lives in a group home  Medical XKG:YJEHUDJ has a history of high blood pressure, diabetes and pulmonary embolism Substance abuse history: Not abusing any drugs currently  Past Psychiatric History: multiple prior hospitalizations. Does haory of suicidality in the past. Chronic schizophrenia. Currently maintained on antipsychotics with better outcome than previously. Just discharged from  the hospital.  Risk to Self: Suicidal Ideation: No Suicidal Intent: No Is patient at risk for suicide?: No Suicidal Plan?: No Access to Means: No What has been your use of drugs/alcohol within the last 12 months?: N/A How many times?: 0 Other Self Harm Risks: N/A Intentional Self Injurious Behavior: None Risk to Others: Homicidal Ideation: No Thoughts of Harm to Others: Yes-Currently Present Comment - Thoughts of Harm to Others: No plan, though he worries he will harm others Current Homicidal Intent: No Current Homicidal Plan: No Access to Homicidal Means: No Identified Victim: N/A History of harm to others?: No Assessment of Violence: None Noted Violent Behavior Description: Pt stated he felt like he was going to destroy things Does patient have access to weapons?: No Criminal Charges Pending?: No Does patient have a court date: No Prior Inpatient Therapy: Prior Inpatient Therapy: Yes Prior Therapy Dates: 02/2017 Prior Therapy Facilty/Provider(s): [facility] Reason for Treatment: HI Prior Outpatient Therapy: Prior Outpatient Therapy: Yes Prior Therapy Dates: 02/2017 Prior Therapy Facilty/Provider(s): [facility] Does patient have an ACCT team?: No Does patient have Intensive In-House Services?  : No Does patient have [facility] services? : No Does patient have P4CC services?: No  Past Medical History:  Past Medical History:  Diagnosis Date  . Depression   . Diabetes mellitus without complication ([name])   . Hyperlipidemia   . Hypertension   . Lupus anticoagulant disorder ([name]) 06/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  . PE (pulmonary thromboembolism) ([name])   . Schizo affective schizophrenia ([name])   . Supratherapeutic INR 11/19/2016   History reviewed. No pertinent surgical history. Family History:  Family History  Problem Relation Age of Onset  . CAD Mother   . CAD Sister  Family Psychiatric  History: ositive for thought disorder Social History:  History  Alcohol Use  No    Comment: occassionally     History  Drug Use  . Types: Marijuana    Comment: Last use 11/29/16    Social History   Social History  . Marital status: Single    Spouse name: N/A  . Number of children: N/A  . Years of education: N/A   Social History Main Topics  . Smoking status: Current Every Day Smoker    Packs/day: 1.00    Years: 23.00    Types: Cigarettes  . Smokeless tobacco: Never Used  . Alcohol use No     Comment: occassionally  . Drug use: Yes    Types: Marijuana     Comment: Last use 11/29/16  . Sexual activity: No     Comment: occasional marijuana- none recently   Other Topics Concern  . None   Social History Narrative   From a group home in [name]   Additional Social History:    Allergies:   Allergies  Allergen Reactions  . Penicillins Other (See Comments)    Reaction: "lockjaw" Has patient had a PCN reaction causing immediate rash, facial/tongue/throat swelling, SOB or lightheadedness with hypotension: No Has patient had a PCN reaction causing severe rash involving mucus membranes or skin necrosis: No Has patient had a PCN reaction that required hospitalization: No Has patient had a PCN reaction occurring within the last 10 years: No If all of the above answers are "NO", then may proceed with Cephalosporin use.     Labs:  Results for orders placed or performed during the hospital encounter of 03/13/17 (from the past 48 hour(s))  Comprehensive metabolic panel     Status: Abnormal   Collection Time: 03/13/17  3:07 PM  Result Value Ref Range   Sodium 142 135 - 145 mmol/L   Potassium 4.2 3.5 - 5.1 mmol/L   Chloride 108 101 - 111 mmol/L   CO2 23 22 - 32 mmol/L   Glucose, Bld 176 (H) 65 - 99 mg/dL   BUN 19 6 - 20 mg/dL   Creatinine, Ser 0.98 0.61 - 1.24 mg/dL   Calcium 9.5 8.9 - 10.3 mg/dL   Total Protein 7.3 6.5 - 8.1 g/dL   Albumin 4.9 3.5 - 5.0 g/dL   AST 46 (H) 15 - 41 U/L   ALT 73 (H) 17 - 63 U/L   Alkaline Phosphatase 58 38 - 126  U/L   Total Bilirubin 0.7 0.3 - 1.2 mg/dL   GFR calc non Af Amer >60 >60 mL/min   GFR calc Af Amer >60 >60 mL/min    Comment: (NOTE) The eGFR has been calculated using the CKD EPI equation. This calculation has not been validated in all clinical situations. eGFR's persistently <60 mL/min signify possible Chronic Kidney Disease.    Anion gap 11 5 - 15  Ethanol     Status: None   Collection Time: 03/13/17  3:07 PM  Result Value Ref Range   Alcohol, Ethyl (B) <10 <10 mg/dL    Comment:        LOWEST DETECTABLE LIMIT FOR SERUM ALCOHOL IS 10 mg/dL FOR MEDICAL PURPOSES ONLY Please note change in reference range.   Salicylate level     Status: None   Collection Time: 03/13/17  3:07 PM  Result Value Ref Range   Salicylate Lvl <6.2 2.8 - 30.0 mg/dL  Acetaminophen level     Status: Abnormal   Collection  Time: 03/13/17  3:07 PM  Result Value Ref Range   Acetaminophen (Tylenol), Serum <10 (L) 10 - 30 ug/mL    Comment:        THERAPEUTIC CONCENTRATIONS VARY SIGNIFICANTLY. A RANGE OF 10-30 ug/mL MAY BE AN EFFECTIVE CONCENTRATION FOR MANY PATIENTS. HOWEVER, SOME ARE BEST TREATED AT CONCENTRATIONS OUTSIDE THIS RANGE. ACETAMINOPHEN CONCENTRATIONS >150 ug/mL AT 4 HOURS AFTER INGESTION AND >50 ug/mL AT 12 HOURS AFTER INGESTION ARE OFTEN ASSOCIATED WITH TOXIC REACTIONS.   cbc     Status: None   Collection Time: 03/13/17  3:07 PM  Result Value Ref Range   WBC 6.3 3.8 - 10.6 K/uL   RBC 5.04 4.40 - 5.90 MIL/uL   Hemoglobin 15.9 13.0 - 18.0 g/dL   HCT 45.6 40.0 - 52.0 %   MCV 90.4 80.0 - 100.0 fL   MCH 31.6 26.0 - 34.0 pg   MCHC 35.0 32.0 - 36.0 g/dL   RDW 13.0 11.5 - 14.5 %   Platelets 236 150 - 440 K/uL  Urine Drug Screen, Qualitative     Status: None   Collection Time: 03/13/17  5:12 PM  Result Value Ref Range   Tricyclic, Ur Screen NONE DETECTED NONE DETECTED   Amphetamines, Ur Screen NONE DETECTED NONE DETECTED   MDMA (Ecstasy)Ur Screen NONE DETECTED NONE DETECTED   Cocaine  Metabolite,Ur [place] NONE DETECTED NONE DETECTED   Opiate, Ur Screen NONE DETECTED NONE DETECTED   Phencyclidine (PCP) Ur S NONE DETECTED NONE DETECTED   Cannabinoid 50 Ng, Ur [place] NONE DETECTED NONE DETECTED   Barbiturates, Ur Screen NONE DETECTED NONE DETECTED   Benzodiazepine, Ur Scrn NONE DETECTED NONE DETECTED   Methadone Scn, Ur NONE DETECTED NONE DETECTED    Comment: (NOTE) 656  Tricyclics, urine               Cutoff 1000 ng/mL 200  Amphetamines, urine             Cutoff 1000 ng/mL 300  MDMA (Ecstasy), urine           Cutoff 500 ng/mL 400  Cocaine Metabolite, urine       Cutoff 300 ng/mL 500  Opiate, urine                   Cutoff 300 ng/mL 600  Phencyclidine (PCP), urine      Cutoff 25 ng/mL 700  Cannabinoid, urine              Cutoff 50 ng/mL 800  Barbiturates, urine             Cutoff 200 ng/mL 900  Benzodiazepine, urine           Cutoff 200 ng/mL 1000 Methadone, urine                Cutoff 300 ng/mL 1100 1200 The urine drug screen provides only a preliminary, unconfirmed 1300 analytical test result and should not be used for non-medical 1400 purposes. Clinical consideration and professional judgment should 1500 be applied to any positive drug screen result due to possible 1600 interfering substances. A more specific alternate chemical method 1700 must be used in order to obtain a confirmed analytical result.  1800 Gas chromato graphy / mass spectrometry (GC/MS) is the preferred 1900 confirmatory method.   Glucose, capillary     Status: None   Collection Time: 03/13/17  8:12 PM  Result Value Ref Range   Glucose-Capillary 99 65 - 99 mg/dL  Glucose, capillary  Status: Abnormal   Collection Time: 03/14/17  9:36 AM  Result Value Ref Range   Glucose-Capillary 172 (H) 65 - 99 mg/dL  Glucose, capillary     Status: None   Collection Time: 03/14/17 12:15 PM  Result Value Ref Range   Glucose-Capillary 78 65 - 99 mg/dL    Current Facility-Administered Medications  Medication  Dose Route Frequency Provider Last Rate Last Dose  . albuterol (PROVENTIL HFA;VENTOLIN HFA) 108 (90 Base) MCG/ACT inhaler 1-2 puff  1-2 puff Inhalation Q4H PRN Nena Polio, MD      . buPROPion (WELLBUTRIN XL) 24 hr tablet 150 mg  150 mg Oral Daily Nena Polio, MD   150 mg at 03/14/17 1051  . cloZAPine (CLOZARIL) tablet 500 mg  500 mg Oral QHS Nena Polio, MD   500 mg at 03/13/17 2215  . docusate sodium (COLACE) capsule 200 mg  200 mg Oral BID Nena Polio, MD   200 mg at 03/14/17 1053  . insulin detemir (LEVEMIR) injection 14 Units  14 Units Subcutaneous Marthe Patch, MD   14 Units at 03/14/17 1000  . ipratropium (ATROVENT) 0.06 % nasal spray 1 spray  1 spray Each Nare QHS Nena Polio, MD   1 spray at 03/13/17 2217  . lisinopril (PRINIVIL,ZESTRIL) tablet 2.5 mg  2.5 mg Oral Daily Nena Polio, MD   2.5 mg at 03/14/17 1146  . lithium carbonate (ESKALITH) CR tablet 450 mg  450 mg Oral Q12H Nena Polio, MD   450 mg at 03/14/17 1051  . metFORMIN (GLUCOPHAGE-XR) 24 hr tablet 500 mg  500 mg Oral BID WC Nena Polio, MD   500 mg at 03/14/17 1000  . mometasone-formoterol (DULERA) 200-5 MCG/ACT inhaler 2 puff  2 puff Inhalation BID Nena Polio, MD   2 puff at 03/14/17 1000  . omega-3 acid ethyl esters (LOVAZA) capsule 1 g  1 g Oral Daily Nena Polio, MD   1 g at 03/14/17 1051  . polyethylene glycol (MIRALAX / GLYCOLAX) packet 17 g  17 g Oral Daily Nena Polio, MD   17 g at 03/14/17 1048  . simvastatin (ZOCOR) tablet 40 mg  40 mg Oral QHS Nena Polio, MD   40 mg at 03/13/17 2220  . traZODone (DESYREL) tablet 75 mg  75 mg Oral QHS Nena Polio, MD   75 mg at 03/13/17 2221  . warfarin (COUMADIN) tablet 5 mg  5 mg Oral q1800 Nena Polio, MD       Current Outpatient Prescriptions  Medication Sig Dispense Refill  . albuterol (PROVENTIL HFA;VENTOLIN HFA) 108 (90 Base) MCG/ACT inhaler Inhale 1-2 puffs into the lungs every 4 (four) hours as needed for  wheezing or shortness of breath. 1 Inhaler 0  . buPROPion (WELLBUTRIN XL) 150 MG 24 hr tablet Take 1 tablet (150 mg total) by mouth daily. 30 tablet 0  . cloZAPine (CLOZARIL) 100 MG tablet Take 5 tablets (500 mg total) by mouth at bedtime. 150 tablet 0  . docusate sodium (COLACE) 100 MG capsule Take 2 capsules (200 mg total) by mouth 2 (two) times daily. 120 capsule 0  . Fluticasone-Salmeterol (ADVAIR) 250-50 MCG/DOSE AEPB Inhale 1 puff into the lungs 2 (two) times daily.    . insulin detemir (LEVEMIR) 100 UNIT/ML injection Inject 14 Units into the skin every morning.    Marland Kitchen ipratropium (ATROVENT) 0.06 % nasal spray Place 1 spray into both nostrils at bedtime. 3  mL 0  . lisinopril (PRINIVIL,ZESTRIL) 2.5 MG tablet Take 2.5 mg by mouth daily.    Marland Kitchen lithium carbonate (ESKALITH) 450 MG CR tablet Take 1 tablet (450 mg total) by mouth every 12 (twelve) hours. 60 tablet 0  . metformin (FORTAMET) 500 MG (OSM) 24 hr tablet Take 500 mg by mouth 2 (two) times daily with a meal.    . mometasone-formoterol (DULERA) 200-5 MCG/ACT AERO Inhale 2 puffs into the lungs 2 (two) times daily. 1 Inhaler 1  . omega-3 acid ethyl esters (LOVAZA) 1 g capsule Take 1 g by mouth daily.    . polyethylene glycol (MIRALAX / GLYCOLAX) packet Take 17 g by mouth daily. 30 each 0  . simvastatin (ZOCOR) 40 MG tablet Take 40 mg by mouth at bedtime.    . traZODone (DESYREL) 50 MG tablet Take 75 mg by mouth at bedtime.    Marland Kitchen warfarin (COUMADIN) 10 MG tablet Take 10 mg by mouth daily.    Marland Kitchen warfarin (COUMADIN) 5 MG tablet Take 1 tablet (5 mg total) by mouth daily at 6 PM. 30 tablet 0    Musculoskeletal: Strength & Muscle Tone: within normal limits Gait & Station: normal Patient leans: N/A  Psychiatric Specialty Exam: Physical Exam  Nursing note and vitals reviewed. Constitutional: He appears well-developed and well-nourished.  HENT:  Head: Normocephalic and atraumatic.  Eyes: Pupils are equal, round, and reactive to light.  Conjunctivae are normal.  Neck: Normal range of motion.  Cardiovascular: Regular rhythm and normal heart sounds.   Respiratory: Effort normal. No respiratory distress.  GI: Soft.  Musculoskeletal: Normal range of motion.  Neurological: He is alert.  Skin: Skin is warm and dry.  Psychiatric: His speech is normal and behavior is normal. Judgment and thought content normal. His affect is blunt. Cognition and memory are normal.    Review of Systems  Constitutional: Negative.   HENT: Negative.   Eyes: Negative.   Respiratory: Negative.   Cardiovascular: Negative.   Gastrointestinal: Negative.   Musculoskeletal: Negative.   Skin: Negative.   Neurological: Negative.   Psychiatric/Behavioral: Positive for hallucinations. Negative for depression, memory loss, substance abuse and suicidal ideas. The patient has insomnia. The patient is not nervous/anxious.     Blood pressure 119/75, pulse 70, temperature 97.9 F (36.6 C), temperature source Oral, resp. rate 18, weight 86.2 kg (190 lb), SpO2 100 %.Body mass index is 27.26 kg/m.  General Appearance: Disheveled  Eye Contact:  Good  Speech:  Slow  Volume:  Decreased  Mood:  Euthymic  Affect:  Constricted  Thought Process:  Goal Directed  Orientation:  Full (Time, Place, and Person)  Thought Content:  Logical  Suicidal Thoughts:  No  Homicidal Thoughts:  No  Memory:  Immediate;   Fair Recent;   Fair Remote;   Fair  Judgement:  Fair  Insight:  Fair  Psychomotor Activity:  Decreased  Concentration:  Concentration: Fair  Recall:  [employer] of Knowledge:  Fair  Language:  Fair  Akathisia:  No  Handed:  Right  AIMS (if indicated):     Assets:  Communication Skills Desire for Improvement Financial Resources/Insurance Housing Resilience Social Support  ADL's:  Intact  Cognition:  WNL  Sleep:        Treatment Plan Summary: Plan patient appears to be back to his baseline. Lucid. Denies suicidal or homicidal thoughts. Patient is  agreeable to outpatient treatment. No longer requires inpatient hospital treatment. Does not meet commitment criteria. Discontinue IVC. Case reviewed with  TTS and emergency room physician. He can be discharged back to his group home.  Disposition: Patient does not meet criteria for psychiatric inpatient admission.  Alethia Berthold, MD 03/14/2017 4:36 PM

## 2017-03-14 NOTE — ED Notes (Signed)
PT IVC/ PENDING SOC CONSULT  

## 2017-03-14 NOTE — ED Notes (Signed)
Patient is speaking with the [facility]---Dr. Orpah Clinton.

## 2017-03-14 NOTE — ED Notes (Signed)
Patient observed resting in bed. Respirations even and non labored no distress noted. Monitoring continues.

## 2017-03-14 NOTE — ED Notes (Addendum)
Nurse spoke to guardian Maureen Ralphs) and she states she will call the group home about him being transported back to facility, and she would also call back with correct number to group home after she finds it.

## 2017-03-16 ENCOUNTER — Emergency Department
Admission: EM | Admit: 2017-03-16 | Discharge: 2017-03-17 | Disposition: A | Discharge status: Home / Self Care | Payer: Medicare Other | Attending: Emergency Medicine | Admitting: Emergency Medicine

## 2017-03-16 ENCOUNTER — Encounter: Payer: Self-pay | Admitting: Emergency Medicine

## 2017-03-16 DIAGNOSIS — Z7901 Long term (current) use of anticoagulants: Secondary | ICD-10-CM | POA: Insufficient documentation

## 2017-03-16 DIAGNOSIS — F329 Major depressive disorder, single episode, unspecified: Secondary | ICD-10-CM | POA: Insufficient documentation

## 2017-03-16 DIAGNOSIS — F17210 Nicotine dependence, cigarettes, uncomplicated: Secondary | ICD-10-CM | POA: Diagnosis not present

## 2017-03-16 DIAGNOSIS — R112 Nausea with vomiting, unspecified: Secondary | ICD-10-CM | POA: Diagnosis not present

## 2017-03-16 DIAGNOSIS — I10 Essential (primary) hypertension: Secondary | ICD-10-CM | POA: Insufficient documentation

## 2017-03-16 DIAGNOSIS — Z79899 Other long term (current) drug therapy: Secondary | ICD-10-CM | POA: Diagnosis not present

## 2017-03-16 DIAGNOSIS — R1110 Vomiting, unspecified: Secondary | ICD-10-CM

## 2017-03-16 DIAGNOSIS — F250 Schizoaffective disorder, bipolar type: Secondary | ICD-10-CM | POA: Diagnosis not present

## 2017-03-16 DIAGNOSIS — E119 Type 2 diabetes mellitus without complications: Secondary | ICD-10-CM | POA: Insufficient documentation

## 2017-03-16 DIAGNOSIS — Z794 Long term (current) use of insulin: Secondary | ICD-10-CM | POA: Diagnosis not present

## 2017-03-16 LAB — URINALYSIS, COMPLETE (UACMP) WITH MICROSCOPIC
BACTERIA UA: NONE SEEN
BILIRUBIN URINE: NEGATIVE
Glucose, UA: NEGATIVE mg/dL
HGB URINE DIPSTICK: NEGATIVE
Ketones, ur: 5 mg/dL — AB
LEUKOCYTES UA: NEGATIVE
NITRITE: NEGATIVE
PROTEIN: 100 mg/dL — AB
Specific Gravity, Urine: 1.031 — ABNORMAL HIGH (ref 1.005–1.030)
pH: 5 (ref 5.0–8.0)

## 2017-03-16 LAB — COMPREHENSIVE METABOLIC PANEL
ALBUMIN: 5 g/dL (ref 3.5–5.0)
ALT: 64 U/L — ABNORMAL HIGH (ref 17–63)
ANION GAP: 10 (ref 5–15)
AST: 31 U/L (ref 15–41)
Alkaline Phosphatase: 58 U/L (ref 38–126)
BILIRUBIN TOTAL: 0.8 mg/dL (ref 0.3–1.2)
BUN: 19 mg/dL (ref 6–20)
CO2: 25 mmol/L (ref 22–32)
Calcium: 9.9 mg/dL (ref 8.9–10.3)
Chloride: 106 mmol/L (ref 101–111)
Creatinine, Ser: 1.01 mg/dL (ref 0.61–1.24)
GFR calc Af Amer: >60 mL/min (ref >60)
GFR calc non Af Amer: >60 mL/min (ref >60)
GLUCOSE: 201 mg/dL — AB (ref 65–99)
POTASSIUM: 4 mmol/L (ref 3.5–5.1)
Sodium: 141 mmol/L (ref 135–145)
TOTAL PROTEIN: 7.8 g/dL (ref 6.5–8.1)

## 2017-03-16 LAB — URINE DRUG SCREEN, QUALITATIVE (ARMC ONLY)
Amphetamines, Ur Screen: NOT DETECTED
BARBITURATES, UR SCREEN: NOT DETECTED
BENZODIAZEPINE, UR SCRN: NOT DETECTED
CANNABINOID 50 NG, UR ~~LOC~~: NOT DETECTED
Cocaine Metabolite,Ur ~~LOC~~: NOT DETECTED
MDMA (Ecstasy)Ur Screen: NOT DETECTED
Methadone Scn, Ur: NOT DETECTED
OPIATE, UR SCREEN: NOT DETECTED
PHENCYCLIDINE (PCP) UR S: NOT DETECTED
Tricyclic, Ur Screen: NOT DETECTED

## 2017-03-16 LAB — CBC
HEMATOCRIT: 45.3 % (ref 40.0–52.0)
HEMOGLOBIN: 16.1 g/dL (ref 13.0–18.0)
MCH: 32 pg (ref 26.0–34.0)
MCHC: 35.5 g/dL (ref 32.0–36.0)
MCV: 90.2 fL (ref 80.0–100.0)
Platelets: 270 10*3/uL (ref 150–440)
RBC: 5.02 MIL/uL (ref 4.40–5.90)
RDW: 13 % (ref 11.5–14.5)
WBC: 8.1 10*3/uL (ref 3.8–10.6)

## 2017-03-16 LAB — LIPASE, BLOOD: Lipase: 26 U/L (ref 11–51)

## 2017-03-16 LAB — ACETAMINOPHEN LEVEL

## 2017-03-16 LAB — ETHANOL

## 2017-03-16 LAB — SALICYLATE LEVEL: Salicylate Lvl: <7 mg/dL (ref 2.8–30.0)

## 2017-03-16 MED ORDER — ONDANSETRON 4 MG PO TBDP
4.0000 mg | ORAL_TABLET | Freq: Once | ORAL | Status: AC | PRN
  Administered 2017-03-16: 4 mg via ORAL
  Filled 2017-03-16: qty 1

## 2017-03-16 MED ORDER — SODIUM CHLORIDE 0.9 % IV BOLUS (SEPSIS)
1000.0000 mL | Freq: Once | INTRAVENOUS | Status: AC
  Administered 2017-03-16: 1000 mL via INTRAVENOUS

## 2017-03-16 MED ORDER — METOCLOPRAMIDE HCL 5 MG/ML IJ SOLN
10.0000 mg | Freq: Once | INTRAMUSCULAR | Status: AC
  Administered 2017-03-16: 10 mg via INTRAVENOUS
  Filled 2017-03-16: qty 2

## 2017-03-16 NOTE — ED Provider Notes (Signed)
[facility] Emergency Department Provider Note ____________________________________________   First MD Initiated Contact with Patient 03/16/17 2225     (approximate)  I have reviewed the triage vital signs and the nursing notes.   HISTORY  Chief Complaint Nausea; Emesis; and Psychiatric Evaluation    HPI Lance [name] is a 41 y.o. male with past history as below who presents with nausea and vomiting for 2 days, described as watery and nonbloody, not associated with abdominal pain or diarrhea. Patient does report subjective chills. Denies sick contacts, antibiotic use, or other recent illness. Patient states she has not been able take his mental health medications last 2 days. He also reports both visual and tactile hallucinations, feeling like people are touching him, and "Satanic thoughts" including feelings of wanting to hurt others.  He denies SI.    Past Medical History:  Diagnosis Date  . Depression   . Diabetes mellitus without complication (HCC)   . Hyperlipidemia   . Hypertension   . Lupus anticoagulant disorder (HCC) 06/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  . PE (pulmonary thromboembolism) (HCC)   . Schizo affective schizophrenia (HCC)   . Supratherapeutic INR 11/19/2016    Patient Active Problem List   Diagnosis Date Noted  . Tobacco use disorder 09/01/2016  . Schizoaffective disorder, bipolar type (HCC) 05/22/2016  . Diabetes mellitus without complication (HCC) 05/21/2016  . Hyperlipidemia 05/21/2016  . History of pulmonary embolism 05/21/2016  . Chronic anticoagulation 05/21/2016  . Hypertension 09/25/2015    History reviewed. No pertinent surgical history.  Prior to Admission medications   Medication Sig Start Date End Date Taking? Authorizing Provider  albuterol (PROVENTIL HFA;VENTOLIN HFA) 108 (90 Base) MCG/ACT inhaler Inhale 1-2 puffs into the lungs every 4 (four) hours as needed for wheezing or shortness of breath.  12/31/16   Jimmy Footman, MD  buPROPion (WELLBUTRIN XL) 150 MG 24 hr tablet Take 1 tablet (150 mg total) by mouth daily. 09/15/16   Jimmy Footman, MD  cloZAPine (CLOZARIL) 100 MG tablet Take 5 tablets (500 mg total) by mouth at bedtime. 12/03/16   Jimmy Footman, MD  docusate sodium (COLACE) 100 MG capsule Take 2 capsules (200 mg total) by mouth 2 (two) times daily. 09/14/16   Jimmy Footman, MD  Fluticasone-Salmeterol (ADVAIR) 250-50 MCG/DOSE AEPB Inhale 1 puff into the lungs 2 (two) times daily.    [provider]  insulin detemir (LEVEMIR) 100 UNIT/ML injection Inject 14 Units into the skin every morning.    [provider]  ipratropium (ATROVENT) 0.06 % nasal spray Place 1 spray into both nostrils at bedtime. 09/14/16   Jimmy Footman, MD  lisinopril (PRINIVIL,ZESTRIL) 2.5 MG tablet Take 2.5 mg by mouth daily.    [provider]  lithium carbonate (ESKALITH) 450 MG CR tablet Take 1 tablet (450 mg total) by mouth every 12 (twelve) hours. 09/14/16   Jimmy Footman, MD  metformin (FORTAMET) 500 MG (OSM) 24 hr tablet Take 500 mg by mouth 2 (two) times daily with a meal.    [provider]  mometasone-formoterol (DULERA) 200-5 MCG/ACT AERO Inhale 2 puffs into the lungs 2 (two) times daily. 11/20/16   Altamese Dilling, MD  omega-3 acid ethyl esters (LOVAZA) 1 g capsule Take 1 g by mouth daily.    [provider]  polyethylene glycol (MIRALAX / GLYCOLAX) packet Take 17 g by mouth daily. 09/15/16   Jimmy Footman, MD  simvastatin (ZOCOR) 40 MG tablet Take 40 mg by mouth  at bedtime.    [provider]  traZODone (DESYREL) 50 MG tablet Take 75 mg by mouth at bedtime.    [provider]  warfarin (COUMADIN) 10 MG tablet Take 10 mg by mouth daily.    [provider]  warfarin (COUMADIN) 5 MG tablet Take 1 tablet (5 mg total) by mouth daily at 6 PM. 01/01/17    Jimmy Footman, MD    Allergies Penicillins  Family History  Problem Relation Age of Onset  . CAD Mother   . CAD Sister     Social History Social History  Substance Use Topics  . Smoking status: Current Every Day Smoker    Packs/day: 1.00    Years: 23.00    Types: Cigarettes  . Smokeless tobacco: Never Used  . Alcohol use No     Comment: occassionally    Review of Systems  Constitutional: Positive for chills Eyes: Positive for visual hallucinations. ENT: No sore throat. Cardiovascular: Denies chest pain. Respiratory: Denies shortness of breath. Gastrointestinal: Positive for nausea and vomiting.  Genitourinary: Negative for dysuria.  Musculoskeletal: Negative for back pain. Skin: Negative for rash. Neurological: Negative for headache.   ____________________________________________   PHYSICAL EXAM:  VITAL SIGNS: ED Triage Vitals  Enc Vitals Group     BP 03/16/17 2028 133/85     Pulse Rate 03/16/17 2028 68     Resp 03/16/17 2028 20     Temp 03/16/17 2028 98 F (36.7 C)     Temp Source 03/16/17 2028 Oral     SpO2 03/16/17 2027 96 %     Weight 03/16/17 2029 191 lb (86.6 kg)     Height 03/16/17 2029  (1.778 m)     Head Circumference --      Peak Flow --      Pain Score 03/16/17 2027 0     Pain Loc --      Pain Edu? --      Excl. in GC? --     Constitutional: Alert and oriented. Well appearing and in no acute distress. Eyes: Conjunctivae are normal.  Head: Atraumatic. Nose: No congestion/rhinnorhea. Mouth/Throat: Mucous membranes are moist.   Neck: Normal range of motion.  Cardiovascular:   Good peripheral circulation. Respiratory: Normal respiratory effort.  No retractions.  Gastrointestinal: Soft and nontender. No distention.  Genitourinary: No CVA tenderness. Musculoskeletal:   Extremities warm and well perfused.  Neurologic:  Normal speech and language. No gross focal neurologic deficits are appreciated.  Skin:  Skin is warm  and dry. No rash noted. Psychiatric: Slightly flat affect. Speech and behavior are normal.  ____________________________________________   LABS (all labs ordered are listed, but only abnormal results are displayed)  Labs Reviewed  COMPREHENSIVE METABOLIC PANEL - Abnormal; Notable for the following:       Result Value   Glucose, Bld 201 (*)    ALT 64 (*)    All other components within normal limits  URINALYSIS, COMPLETE (UACMP) WITH MICROSCOPIC - Abnormal; Notable for the following:    Color, Urine AMBER (*)    APPearance HAZY (*)    Specific Gravity, Urine 1.031 (*)    Ketones, ur 5 (*)    Protein, ur 100 (*)    Squamous Epithelial / LPF 6-30 (*)    All other components within normal limits  ACETAMINOPHEN LEVEL - Abnormal; Notable for the following:    Acetaminophen (Tylenol), Serum <10 (*)    All other components within normal limits  LIPASE, BLOOD  CBC  ETHANOL  URINE DRUG SCREEN, QUALITATIVE ([facility] ONLY)  SALICYLATE LEVEL   ____________________________________________  EKG   ____________________________________________  RADIOLOGY    ____________________________________________   PROCEDURES  Procedure(s) performed: No    Critical Care performed: No ____________________________________________   INITIAL IMPRESSION / ASSESSMENT AND PLAN / ED COURSE  Pertinent labs & imaging results that were available during my care of the patient were reviewed by me and considered in my medical decision making (see chart for details).  41 year old male with history of schizoaffective disorder and other past medical history as noted, with multiple prior visits to this ED, presents with nausea and vomiting for last 2 days without abdominal pain. He also reports visual and tactile hallucinations, and "Satanic thoughts" including HI. Vital signs are normal, patient is relatively well-appearing, abdomen is soft and nontender, and physical exam is otherwise unremarkable. In terms  of the nausea and vomiting, presentation most consistent with gastroenteritis versus foodborne illness, less likely pancreatitis or other hepatobiliary cause.  Plan for labs, fluids, and symptomatic treatment with antiemetic. No indication for imaging. Also will place IVC order, and obtain [facility] consult for psych eval.     ----------------------------------------- 11:37 PM on 03/16/2017 -----------------------------------------  Patient's lab workup unremarkable.  Signed out to Dr. Dolores Frame from night shift, pending reassess of his n/v and ability to tolerate PO, and SOC recs.   ____________________________________________   FINAL CLINICAL IMPRESSION(S) / ED DIAGNOSES  Final diagnoses:  Vomiting      NEW MEDICATIONS STARTED DURING THIS VISIT:  New Prescriptions   No medications on file     Note:  This document was prepared using Dragon voice recognition software and may include unintentional dictation errors.    Dionne Bucy, MD 03/16/17 2337

## 2017-03-16 NOTE — ED Triage Notes (Signed)
Pt arrived to the ED accompanied by his care giver for complaints of nausea, vomiting, visual and tactile hallucinations. Pt reports that he is seeing "shapes, angles, people and stuff, it feels like people are holding me." Pt reports that he has a history of schizophrenia and that he is experiencing it at this time combined with being sick. Pt is AOx4, actively vomiting during triage.

## 2017-03-17 DIAGNOSIS — R112 Nausea with vomiting, unspecified: Secondary | ICD-10-CM | POA: Diagnosis not present

## 2017-03-17 LAB — PROTIME-INR
INR: 1.23
PROTHROMBIN TIME: 15.4 s — AB (ref 11.4–15.2)

## 2017-03-17 MED ORDER — ONDANSETRON 4 MG PO TBDP: 4.0000 mg | ORAL_TABLET | Freq: Three times a day (TID) | ORAL | 0 refills | Status: DC | PRN
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
## 2017-03-17 NOTE — ED Notes (Addendum)
Pt moved in room 20 for consult; TTS in progress

## 2017-03-17 NOTE — ED Notes (Signed)
SOC in progress.  

## 2017-03-17 NOTE — ED Notes (Signed)
Pt d/c with [facility] caregiver Doristine Mango who voices understanding of d/c and st pt has appointment this morning with PCP

## 2017-03-17 NOTE — ED Notes (Addendum)
Spoke with pt's legal guardian, [name] [phone]) and informed pt is being d/c back to group home; pt clothing returned to pt, pt dressed and ready for d/c

## 2017-03-17 NOTE — ED Notes (Signed)
Pt uprite on stretcher in hallway with eyes closed; pt reports N/V x 2 days; denies abd pain, denies diarrhea; resp even/unlab, lungs clear, apical audible & regular, +BS, abd soft/nondist/nontender, +periph pulses, -edema; pt denies SI or HI but admits to auditory and visual hallucinations  ENVIRONMENTAL ASSESSMENT Potentially harmful objects out of patient reach: Yes.   Personal belongings secured: Yes.   Patient dressed in hospital provided attire only: Yes.   Plastic bags out of patient reach: Yes.   Patient care equipment (cords, cables, call bells, lines, and drains) shortened, removed, or accounted for: Yes.   Equipment and supplies removed from bottom of stretcher: Yes.   Potentially toxic materials out of patient reach: Yes.   Sharps container removed or out of patient reach: Yes.

## 2017-03-17 NOTE — ED Notes (Signed)
Pt reports relief of nausea after med admin

## 2017-03-17 NOTE — ED Notes (Signed)
BEHAVIORAL HEALTH ROUNDING Patient sleeping: YES Patient alert and oriented: SLEEPING Behavior appropriate: SLEEPING Nutrition and fluids offered: SLEEPING Toileting and hygiene offered: SLEEPING Sitter present: YES Law enforcement present: YES 

## 2017-03-17 NOTE — ED Notes (Signed)
Patient in hallway, lying on bed with eyes closed; appears to be resting comfortably, sleeping soundly. Shows no s/sx of acute distress. Even, unlabored resp noted.Will continue to monitor.Call light in reach and environment secured with stretcher locked in lowest position

## 2017-03-17 NOTE — ED Provider Notes (Signed)
-----------------------------------------   3:04 AM on 03/17/2017 -----------------------------------------  Patient was evaluated by [facility] psychiatrist Dr. Jacky Kindle who rescinded patient's IVC and deems him psychiatrically stable for discharge. Patient has had no emesis since my shift at 11 PM and has tolerated fluids without emesis. INR is unremarkable. Strict return precautions given. Patient verbalizes understanding and agrees with plan of care.   Irean Hong, MD 03/17/17 [phone]

## 2017-03-17 NOTE — Discharge Instructions (Signed)
1. You may take Zofran as needed for nausea. 2. Clear liquids 12 hours, then bland diet 3 days, then slowly advance diet as tolerated. 3. Return to the ER for worsening symptoms, persistent vomiting, feelings of hurting yourself or others, or other concerns.

## 2017-03-17 NOTE — ED Notes (Signed)
BEHAVIORAL HEALTH ROUNDING Patient sleeping: No. Patient alert and oriented: yes Behavior appropriate: Yes.  ; If no, describe:  Nutrition and fluids offered: yes Toileting and hygiene offered: Yes  Sitter present: q15 minute observations and security camera monitoring Law enforcement present: Yes  ODS  

## 2017-03-19 DIAGNOSIS — Z79899 Other long term (current) drug therapy: Secondary | ICD-10-CM | POA: Diagnosis not present

## 2017-03-19 DIAGNOSIS — F209 Schizophrenia, unspecified: Secondary | ICD-10-CM | POA: Diagnosis not present

## 2017-03-25 ENCOUNTER — Other Ambulatory Visit: Payer: Self-pay | Admitting: Internal Medicine

## 2017-03-25 ENCOUNTER — Ambulatory Visit
Admission: RE | Admit: 2017-03-25 | Discharge: 2017-03-25 | Disposition: A | Discharge status: Home / Self Care | Payer: Medicare Other | Source: Ambulatory Visit | Attending: Internal Medicine | Admitting: Internal Medicine

## 2017-03-25 ENCOUNTER — Ambulatory Visit: Admission: RE | Admit: 2017-03-25 | Payer: Medicare Other | Source: Ambulatory Visit | Admitting: Cardiology

## 2017-03-25 DIAGNOSIS — R7611 Nonspecific reaction to tuberculin skin test without active tuberculosis: Secondary | ICD-10-CM | POA: Insufficient documentation

## 2017-03-31 DIAGNOSIS — I2699 Other pulmonary embolism without acute cor pulmonale: Secondary | ICD-10-CM | POA: Diagnosis not present

## 2017-03-31 DIAGNOSIS — I10 Essential (primary) hypertension: Secondary | ICD-10-CM | POA: Diagnosis not present

## 2017-03-31 DIAGNOSIS — F3131 Bipolar disorder, current episode depressed, mild: Secondary | ICD-10-CM | POA: Diagnosis not present

## 2017-03-31 DIAGNOSIS — F250 Schizoaffective disorder, bipolar type: Secondary | ICD-10-CM | POA: Diagnosis not present

## 2017-03-31 DIAGNOSIS — K529 Noninfective gastroenteritis and colitis, unspecified: Secondary | ICD-10-CM | POA: Diagnosis not present

## 2017-04-10 ENCOUNTER — Encounter: Payer: Self-pay | Admitting: Emergency Medicine

## 2017-04-10 ENCOUNTER — Emergency Department
Admission: EM | Admit: 2017-04-10 | Discharge: 2017-04-10 | Disposition: A | Discharge status: Home / Self Care | Payer: Medicare Other | Attending: Emergency Medicine | Admitting: Emergency Medicine

## 2017-04-10 DIAGNOSIS — K047 Periapical abscess without sinus: Secondary | ICD-10-CM | POA: Diagnosis not present

## 2017-04-10 DIAGNOSIS — E119 Type 2 diabetes mellitus without complications: Secondary | ICD-10-CM | POA: Diagnosis not present

## 2017-04-10 DIAGNOSIS — I10 Essential (primary) hypertension: Secondary | ICD-10-CM | POA: Insufficient documentation

## 2017-04-10 DIAGNOSIS — F17210 Nicotine dependence, cigarettes, uncomplicated: Secondary | ICD-10-CM | POA: Insufficient documentation

## 2017-04-10 DIAGNOSIS — K0889 Other specified disorders of teeth and supporting structures: Secondary | ICD-10-CM | POA: Diagnosis present

## 2017-04-10 DIAGNOSIS — Z79899 Other long term (current) drug therapy: Secondary | ICD-10-CM | POA: Insufficient documentation

## 2017-04-10 DIAGNOSIS — Z794 Long term (current) use of insulin: Secondary | ICD-10-CM | POA: Insufficient documentation

## 2017-04-10 DIAGNOSIS — Z7901 Long term (current) use of anticoagulants: Secondary | ICD-10-CM | POA: Insufficient documentation

## 2017-04-10 MED ORDER — CLINDAMYCIN PHOSPHATE 600 MG/4ML IJ SOLN: 600.0000 mg | Freq: Once | INTRAMUSCULAR | Status: DC

## 2017-04-10 MED ORDER — CLINDAMYCIN HCL 150 MG PO CAPS: ORAL_CAPSULE | ORAL | 0 refills | Status: DC

## 2017-04-10 MED ORDER — CLINDAMYCIN PHOSPHATE 600 MG/4ML IJ SOLN
600.0000 mg | Freq: Once | INTRAMUSCULAR | Status: AC
  Administered 2017-04-10: 600 mg via INTRAMUSCULAR
  Filled 2017-04-10: qty 4

## 2017-04-10 MED ORDER — HYDROCODONE-ACETAMINOPHEN 5-325 MG PO TABS: 1.0000 | ORAL_TABLET | Freq: Four times a day (QID) | ORAL | 0 refills | Status: DC | PRN

## 2017-04-10 NOTE — Discharge Instructions (Signed)
Keep appointment with your dentist on Monday. Continue taking clindamycin as prescribed. Your next doses is in 6 hours. A prescription for Norco is given to you to take every 6 hours as needed for severe pain. You may take Tylenol after this and prescription and has been finished.

## 2017-04-10 NOTE — ED Provider Notes (Signed)
[facility] Emergency Department Provider Note  ___________________________________________   First MD Initiated Contact with Patient 04/10/17 [phone]     (approximate)  I have reviewed the triage vital signs and the nursing notes.   HISTORY  Chief Complaint Dental Pain   HPI Lance [name] is a 41 y.o. male still complaining of left lower dental pain that began yesterday. Patient states he woke this morning with swelling to the left side of his face. He denies any known fever or chills. He states that he has a dentist appointment on Monday.currently he rates his pain as 5 out of 10.   Past Medical History:  Diagnosis Date  . Depression   . Diabetes mellitus without complication (HCC)   . Hyperlipidemia   . Hypertension   . Lupus anticoagulant disorder (HCC) 06/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  . PE (pulmonary thromboembolism) (HCC)   . Schizo affective schizophrenia (HCC)   . Supratherapeutic INR 11/19/2016    Patient Active Problem List   Diagnosis Date Noted  . Tobacco use disorder 09/01/2016  . Schizoaffective disorder, bipolar type (HCC) 05/22/2016  . Diabetes mellitus without complication (HCC) 05/21/2016  . Hyperlipidemia 05/21/2016  . History of pulmonary embolism 05/21/2016  . Chronic anticoagulation 05/21/2016  . Hypertension 09/25/2015    History reviewed. No pertinent surgical history.  Prior to Admission medications   Medication Sig Start Date End Date Taking? Authorizing Provider  albuterol (PROVENTIL HFA;VENTOLIN HFA) 108 (90 Base) MCG/ACT inhaler Inhale 1-2 puffs into the lungs every 4 (four) hours as needed for wheezing or shortness of breath. 12/31/16   Jimmy FootmanHernandez-Gonzalez, Andrea, MD  buPROPion (WELLBUTRIN XL) 150 MG 24 hr tablet Take 1 tablet (150 mg total) by mouth daily. 09/15/16   Jimmy FootmanHernandez-Gonzalez, Andrea, MD  clindamycin (CLEOCIN) 150 MG capsule Take 2 capsules every 6 hours for infection 04/10/17   Tommi RumpsSummers,  Seleta Hovland L, PA-C  cloZAPine (CLOZARIL) 100 MG tablet Take 5 tablets (500 mg total) by mouth at bedtime. 12/03/16   Jimmy FootmanHernandez-Gonzalez, Andrea, MD  docusate sodium (COLACE) 100 MG capsule Take 2 capsules (200 mg total) by mouth 2 (two) times daily. 09/14/16   Jimmy FootmanHernandez-Gonzalez, Andrea, MD  Fluticasone-Salmeterol (ADVAIR) 250-50 MCG/DOSE AEPB Inhale 1 puff into the lungs 2 (two) times daily.    [provider]  HYDROcodone-acetaminophen (NORCO/VICODIN) 5-325 MG tablet Take 1 tablet by mouth every 6 (six) hours as needed for moderate pain. 04/10/17   Tommi RumpsSummers, Kathia Covington L, PA-C  insulin detemir (LEVEMIR) 100 UNIT/ML injection Inject 14 Units into the skin every morning.    [provider]  ipratropium (ATROVENT) 0.06 % nasal spray Place 1 spray into both nostrils at bedtime. 09/14/16   Jimmy FootmanHernandez-Gonzalez, Andrea, MD  lisinopril (PRINIVIL,ZESTRIL) 2.5 MG tablet Take 2.5 mg by mouth daily.    [provider]  lithium carbonate (ESKALITH) 450 MG CR tablet Take 1 tablet (450 mg total) by mouth every 12 (twelve) hours. 09/14/16   Jimmy FootmanHernandez-Gonzalez, Andrea, MD  metformin (FORTAMET) 500 MG (OSM) 24 hr tablet Take 500 mg by mouth 2 (two) times daily with a meal.    [provider]  mometasone-formoterol (DULERA) 200-5 MCG/ACT AERO Inhale 2 puffs into the lungs 2 (two) times daily. 11/20/16   Altamese DillingVachhani, Vaibhavkumar, MD  omega-3 acid ethyl esters (LOVAZA) 1 g capsule Take 1 g by mouth daily.    [provider]  ondansetron (ZOFRAN ODT) 4 MG disintegrating tablet Take 1 tablet (4 mg total) by mouth every 8 (  eight) hours as needed for nausea or vomiting. 03/17/17   Irean Hong, MD  polyethylene glycol [facility] Ethelene Hal) packet Take 17 g by mouth daily. 09/15/16   Jimmy Footman, MD  simvastatin (ZOCOR) 40 MG tablet Take 40 mg by mouth at bedtime.    [provider]  traZODone (DESYREL) 50 MG tablet Take 75 mg by mouth at bedtime.    [provider]    warfarin (COUMADIN) 10 MG tablet Take 10 mg by mouth daily.    [provider]  warfarin (COUMADIN) 5 MG tablet Take 1 tablet (5 mg total) by mouth daily at 6 PM. 01/01/17   Jimmy Footman, MD    Allergies Penicillins  Family History  Problem Relation Age of Onset  . CAD Mother   . CAD Sister     Social History Social History  Substance Use Topics  . Smoking status: Current Every Day Smoker    Packs/day: 1.00    Years: 23.00    Types: Cigarettes  . Smokeless tobacco: Never Used  . Alcohol use No     Comment: occassionally    Review of Systems Constitutional: No fever/chills Eyes: No visual changes. ENT: No sore throat. Positive dental pain. Cardiovascular: Denies chest pain. Respiratory: Denies shortness of breath. Musculoskeletal: Negative for back pain. Neurological: Negative for headaches, focal weakness or numbness. ____________________________________________   PHYSICAL EXAM:  VITAL SIGNS: ED Triage Vitals  Enc Vitals Group     BP 04/10/17 0924 (!) 143/97     Pulse Rate 04/10/17 0924 (!) 102     Resp 04/10/17 0924 20     Temp 04/10/17 0924 99.1 F (37.3 C)     Temp Source 04/10/17 0924 Oral     SpO2 04/10/17 0924 98 %     Weight 04/10/17 0925 195 lb (88.5 kg)     Height 04/10/17 0925 5\' 10"  (1.778 m)     Head Circumference --      Peak Flow --      Pain Score 04/10/17 0923 5     Pain Loc --      Pain Edu? --      Excl. in GC? --    Constitutional: Alert and oriented. Well appearing and in no acute distress. Eyes: Conjunctivae are normal.  Head: Atraumatic. Nose: No congestion/rhinnorhea. Mouth/Throat: Mucous membranes are moist.  Oropharynx non-erythematous. Tender and very poor repair and hygiene. Gums are swollen with teeth on the left lower most are below the gumline. There is no obvious centralized abscess. There is moderate facial swelling over this area. No drainage was seen. Neck: No stridor.    Hematological/Lymphatic/Immunilogical: No cervical lymphadenopathy. Cardiovascular: Normal rate, regular rhythm. Grossly normal heart sounds.  Good peripheral circulation. Respiratory: Normal respiratory effort.  No retractions. Lungs CTAB. Musculoskeletal: moves upper and lower extremities without difficulty. Normal gait was noted. Neurologic:  Normal speech and language. No gross focal neurologic deficits are appreciated.  Skin:  Skin is warm, dry and intact.  Psychiatric: Mood and affect are normal. Speech and behavior are normal.  ____________________________________________   LABS (all labs ordered are listed, but only abnormal results are displayed)  Labs Reviewed - No data to display   PROCEDURES  Procedure(s) performed: None  Procedures  Critical Care performed: No  ____________________________________________   INITIAL IMPRESSION / ASSESSMENT AND PLAN / ED COURSE Patient was given clindamycin 600 mg IM in the department. He is encouraged to keep his appointmenton Monday with his dentist. He is also given  a prescription for clindamycin and also Norco one every 6 hours #8. Currently patient is taking Coumadin and should not be taking ibuprofen. ____________________________________________   FINAL CLINICAL IMPRESSION(S) / ED DIAGNOSES  Final diagnoses:  Dental abscess      NEW MEDICATIONS STARTED DURING THIS VISIT:  New Prescriptions   CLINDAMYCIN (CLEOCIN) 150 MG CAPSULE    Take 2 capsules every 6 hours for infection   HYDROCODONE-ACETAMINOPHEN (NORCO/VICODIN) 5-325 MG TABLET    Take 1 tablet by mouth every 6 (six) hours as needed for moderate pain.     Note:  This document was prepared using Dragon voice recognition software and may include unintentional dictation errors.    Tommi Rumps, PA-C 04/10/17 1122    Merrily Brittle, MD 04/10/17 1539

## 2017-04-10 NOTE — ED Notes (Signed)
Spoke with group home and relayed patient will be ready for d/c in about 30 minutes. They reported they are on the way to pick up patient

## 2017-04-10 NOTE — ED Triage Notes (Signed)
States L lower dental pain began yesterday, today woke and noted facial swelling.

## 2017-04-13 ENCOUNTER — Other Ambulatory Visit: Payer: Self-pay | Admitting: *Deleted

## 2017-04-13 NOTE — Patient Outreach (Signed)
[facility]) Care Management  04/13/2017  Lance [name] 1975-07-22 [id number]   Telephone Screen  Referral Date: 04/12/2017 Ref[facility] Source[facility] Reason for Referral: "6 or more ED visits in the past 6 months" Insurance: Medicare   Outreach Attempt: Successful outreach attempt for telephone screening.  [facility].  Per last history and physical [facility], patient is incompetent and has a [place] [facility].  Guardian is Victory DakinVivian [name] [phone].  RN Health Coach spoke with Lance-Guardian and explained [facility].  Guardian declines [facility] services at this time.  Consent: Corbin AdeVivian [name]-Guardian declines [facility] services at this time for the patient.  Plan: RN Health Coach will notify [facility] administrative assistant of case closure status.   Rhae LernerFarrah Tavarus Poteete RN [facility] Care [facility]  RN Health Coach [phone] [email or web address]

## 2017-04-16 DIAGNOSIS — F3131 Bipolar disorder, current episode depressed, mild: Secondary | ICD-10-CM | POA: Diagnosis not present

## 2017-04-16 DIAGNOSIS — I2699 Other pulmonary embolism without acute cor pulmonale: Secondary | ICD-10-CM | POA: Diagnosis not present

## 2017-04-16 DIAGNOSIS — F209 Schizophrenia, unspecified: Secondary | ICD-10-CM | POA: Diagnosis not present

## 2017-04-16 DIAGNOSIS — Z79899 Other long term (current) drug therapy: Secondary | ICD-10-CM | POA: Diagnosis not present

## 2017-04-16 DIAGNOSIS — F250 Schizoaffective disorder, bipolar type: Secondary | ICD-10-CM | POA: Diagnosis not present

## 2017-04-16 DIAGNOSIS — I10 Essential (primary) hypertension: Secondary | ICD-10-CM | POA: Diagnosis not present

## 2017-04-19 ENCOUNTER — Ambulatory Visit: Payer: Medicare Other | Admitting: Podiatry

## 2017-04-22 ENCOUNTER — Encounter: Payer: Self-pay | Admitting: Podiatry

## 2017-04-22 ENCOUNTER — Ambulatory Visit (INDEPENDENT_AMBULATORY_CARE_PROVIDER_SITE_OTHER): Payer: Medicare Other | Admitting: Podiatry

## 2017-04-22 DIAGNOSIS — M79676 Pain in unspecified toe(s): Secondary | ICD-10-CM

## 2017-04-22 DIAGNOSIS — Q828 Other specified congenital malformations of skin: Secondary | ICD-10-CM

## 2017-04-22 DIAGNOSIS — E119 Type 2 diabetes mellitus without complications: Secondary | ICD-10-CM

## 2017-04-22 DIAGNOSIS — B351 Tinea unguium: Secondary | ICD-10-CM | POA: Diagnosis not present

## 2017-04-22 NOTE — Progress Notes (Signed)
Complaint:  Visit Type: Patient returns to my office for continued preventative foot care services. Complaint: Patient states" my nails have grown long and thick and become painful to walk and wear shoes" Patient has been diagnosed with DM with no foot complications. The patient presents for preventative foot care services. No changes to ROS.  Patient also says he has painful callus.  Podiatric Exam: Vascular: dorsalis pedis and posterior tibial pulses are palpable bilateral. Capillary return is immediate. Temperature gradient is WNL. Skin turgor WNL  Sensorium: Normal Semmes Weinstein monofilament test. Normal tactile sensation bilaterally. Nail Exam: Pt has thick disfigured discolored nails with subungual debris noted bilateral entire nail second toenail  through fifth toenails Ulcer Exam: There is no evidence of ulcer or pre-ulcerative changes or infection. Orthopedic Exam: Muscle tone and strength are WNL. No limitations in general ROM. No crepitus or effusions noted. Foot type and digits show no abnormalities. Bony prominences are unremarkable. Skin: No Porokeratosis. No infection or ulcers. Asymptomatic   Pinch callus.  Sub 4 porokeratosis right foot.  Diagnosis:  Onychomycosis, , Pain in right toe, pain in left toes  Treatment & Plan Procedures and Treatment: Consent by patient was obtained for treatment procedures. The patient understood the discussion of treatment and procedures well. All questions were answered thoroughly reviewed. Debridement of mycotic and hypertrophic toenails, 1 through 5 bilateral and clearing of subungual debris. No ulceration, no infection noted. Debride callus. Return Visit-Office Procedure: Patient instructed to return to the office for a follow up visit 3 months for continued evaluation and treatment.    Helane GuntherGregory Noelia Lenart DPM

## 2017-05-14 DIAGNOSIS — F209 Schizophrenia, unspecified: Secondary | ICD-10-CM | POA: Diagnosis not present

## 2017-05-14 DIAGNOSIS — Z79899 Other long term (current) drug therapy: Secondary | ICD-10-CM | POA: Diagnosis not present

## 2017-06-17 DIAGNOSIS — Z79899 Other long term (current) drug therapy: Secondary | ICD-10-CM | POA: Diagnosis not present

## 2017-06-17 DIAGNOSIS — F209 Schizophrenia, unspecified: Secondary | ICD-10-CM | POA: Diagnosis not present

## 2017-07-13 DIAGNOSIS — E1311 Other specified diabetes mellitus with ketoacidosis with coma: Secondary | ICD-10-CM | POA: Diagnosis not present

## 2017-07-13 DIAGNOSIS — I2699 Other pulmonary embolism without acute cor pulmonale: Secondary | ICD-10-CM | POA: Diagnosis not present

## 2017-07-13 DIAGNOSIS — J0140 Acute pansinusitis, unspecified: Secondary | ICD-10-CM | POA: Diagnosis not present

## 2017-07-13 DIAGNOSIS — I2782 Chronic pulmonary embolism: Secondary | ICD-10-CM | POA: Diagnosis not present

## 2017-07-16 ENCOUNTER — Other Ambulatory Visit: Payer: Self-pay | Admitting: Psychiatry

## 2017-07-16 DIAGNOSIS — F209 Schizophrenia, unspecified: Secondary | ICD-10-CM | POA: Diagnosis not present

## 2017-07-16 DIAGNOSIS — Z79899 Other long term (current) drug therapy: Secondary | ICD-10-CM | POA: Diagnosis not present

## 2017-07-26 ENCOUNTER — Ambulatory Visit: Payer: Medicare Other | Admitting: Podiatry

## 2017-08-04 ENCOUNTER — Other Ambulatory Visit: Payer: Self-pay

## 2017-08-04 ENCOUNTER — Encounter: Payer: Self-pay | Admitting: *Deleted

## 2017-08-04 ENCOUNTER — Emergency Department
Admission: EM | Admit: 2017-08-04 | Discharge: 2017-08-04 | Disposition: A | Discharge status: Home / Self Care | Payer: Medicare Other | Attending: Emergency Medicine | Admitting: Emergency Medicine

## 2017-08-04 DIAGNOSIS — R441 Visual hallucinations: Secondary | ICD-10-CM | POA: Insufficient documentation

## 2017-08-04 DIAGNOSIS — Z87891 Personal history of nicotine dependence: Secondary | ICD-10-CM | POA: Insufficient documentation

## 2017-08-04 DIAGNOSIS — I10 Essential (primary) hypertension: Secondary | ICD-10-CM | POA: Insufficient documentation

## 2017-08-04 DIAGNOSIS — Z7901 Long term (current) use of anticoagulants: Secondary | ICD-10-CM | POA: Insufficient documentation

## 2017-08-04 DIAGNOSIS — E119 Type 2 diabetes mellitus without complications: Secondary | ICD-10-CM | POA: Diagnosis not present

## 2017-08-04 DIAGNOSIS — Z79899 Other long term (current) drug therapy: Secondary | ICD-10-CM | POA: Diagnosis not present

## 2017-08-04 DIAGNOSIS — H9311 Tinnitus, right ear: Secondary | ICD-10-CM | POA: Diagnosis not present

## 2017-08-04 DIAGNOSIS — Z794 Long term (current) use of insulin: Secondary | ICD-10-CM | POA: Diagnosis not present

## 2017-08-04 MED ORDER — PREDNISONE 50 MG PO TABS: ORAL_TABLET | ORAL | 0 refills | Status: DC

## 2017-08-04 NOTE — ED Notes (Addendum)
Pt legal guardian contacted for approval to treat. Spoke to [name] and he gave verbal consent for pt treatment. Will contact guardian again before discharge

## 2017-08-04 NOTE — ED Provider Notes (Signed)
[facility] Emergency Department Provider Note  ____________________________________________  Time seen: Approximately 10:25 PM  I have reviewed the triage vital signs and the nursing notes.   HISTORY  Chief Complaint Tinnitus and Hallucinations   Historian Mother    HPI Lance [name] is a 42 y.o. male with a history of schizophrenia presents to the emergency department with tinnitus.  Patient reports that he was cleaning his ear with a Q-tip when tinnitus started.  Patient reports that tinnitus has been persistent.  Patient reports that tinnitus is disturbing his sleep.  He denies discharge from the ear.  Patient also reports that he "sees electricity".  Patient reports that visual hallucinations are baseline for him.  He denies suicidal or homicidal ideation and visual hallucinations are not impeding his activities of daily living.  Past Medical History:  Diagnosis Date  . Depression   . Diabetes mellitus without complication (HCC)   . Hyperlipidemia   . Hypertension   . Lupus anticoagulant disorder (HCC) 06/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  . PE (pulmonary thromboembolism) (HCC)   . Schizo affective schizophrenia (HCC)   . Supratherapeutic INR 11/19/2016     Immunizations up to date:  Yes.     Past Medical History:  Diagnosis Date  . Depression   . Diabetes mellitus without complication (HCC)   . Hyperlipidemia   . Hypertension   . Lupus anticoagulant disorder (HCC) 06/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  . PE (pulmonary thromboembolism) (HCC)   . Schizo affective schizophrenia (HCC)   . Supratherapeutic INR 11/19/2016    Patient Active Problem List   Diagnosis Date Noted  . Tobacco use disorder 09/01/2016  . Schizoaffective disorder, bipolar type (HCC) 05/22/2016  . Diabetes mellitus without complication (HCC) 05/21/2016  . Hyperlipidemia 05/21/2016  . History of pulmonary embolism 05/21/2016  . Chronic  anticoagulation 05/21/2016  . Hypertension 09/25/2015    History reviewed. No pertinent surgical history.  Prior to Admission medications   Medication Sig Start Date End Date Taking? Authorizing Provider  albuterol (PROVENTIL HFA;VENTOLIN HFA) 108 (90 Base) MCG/ACT inhaler Inhale 1-2 puffs into the lungs every 4 (four) hours as needed for wheezing or shortness of breath. 12/31/16   Jimmy Footman, MD  buPROPion (WELLBUTRIN XL) 150 MG 24 hr tablet Take 1 tablet (150 mg total) by mouth daily. 09/15/16   Jimmy Footman, MD  clindamycin (CLEOCIN) 150 MG capsule Take 2 capsules every 6 hours for infection 04/10/17   Tommi Rumps, PA-C  cloZAPine (CLOZARIL) 100 MG tablet Take 5 tablets (500 mg total) by mouth at bedtime. 12/03/16   Jimmy Footman, MD  docusate sodium (COLACE) 100 MG capsule Take 2 capsules (200 mg total) by mouth 2 (two) times daily. 09/14/16   Jimmy Footman, MD  Fluticasone-Salmeterol (ADVAIR) 250-50 MCG/DOSE AEPB Inhale 1 puff into the lungs 2 (two) times daily.    [provider]  HYDROcodone-acetaminophen (NORCO/VICODIN) 5-325 MG tablet Take 1 tablet by mouth every 6 (six) hours as needed for moderate pain. 04/10/17   Tommi Rumps, PA-C  insulin detemir (LEVEMIR) 100 UNIT/ML injection Inject 14 Units into the skin every morning.    [provider]  ipratropium (ATROVENT) 0.06 % nasal spray Place 1 spray into both nostrils at bedtime. 09/14/16   Jimmy Footman, MD  lisinopril (PRINIVIL,ZESTRIL) 2.5 MG tablet Take 2.5 mg by mouth daily.    [provider]  lithium carbonate (ESKALITH) 450 MG CR tablet  Take 1 tablet (450 mg total) by mouth every 12 (twelve) hours. 09/14/16   Jimmy Footman, MD  metformin (FORTAMET) 500 MG (OSM) 24 hr tablet Take 500 mg by mouth 2 (two) times daily with a meal.    [provider]  mometasone-formoterol (DULERA) 200-5 MCG/ACT AERO Inhale 2 puffs  into the lungs 2 (two) times daily. 11/20/16   Altamese Dilling, MD  omega-3 acid ethyl esters (LOVAZA) 1 g capsule Take 1 g by mouth daily.    [provider]  ondansetron (ZOFRAN ODT) 4 MG disintegrating tablet Take 1 tablet (4 mg total) by mouth every 8 (eight) hours as needed for nausea or vomiting. 03/17/17   Irean Hong, MD  polyethylene glycol [facility] Ethelene Hal) packet Take 17 g by mouth daily. 09/15/16   Jimmy Footman, MD  predniSONE (DELTASONE) 50 MG tablet Take one 50 mg tablet daily for the next five days. 08/04/17   Orvil Feil, PA-C  simvastatin (ZOCOR) 40 MG tablet Take 40 mg by mouth at bedtime.    [provider]  traZODone (DESYREL) 50 MG tablet Take 75 mg by mouth at bedtime.    [provider]  warfarin (COUMADIN) 10 MG tablet Take 10 mg by mouth daily.    [provider]  warfarin (COUMADIN) 5 MG tablet Take 1 tablet (5 mg total) by mouth daily at 6 PM. 01/01/17   Jimmy Footman, MD    Allergies Penicillins  Family History  Problem Relation Age of Onset  . CAD Mother   . CAD Sister     Social History Social History   Tobacco Use  . Smoking status: Current Every Day Smoker    Packs/day: 1.00    Years: 23.00    Pack years: 23.00    Types: Cigarettes  . Smokeless tobacco: Never Used  Substance Use Topics  . Alcohol use: No    Comment: occassionally  . Drug use: Yes    Types: Marijuana    Comment: Last use 11/29/16     Review of Systems  Constitutional: No fever/chills Eyes:  No discharge ENT: Patient has tinnitus  Respiratory: no cough. No SOB/ use of accessory muscles to breath Gastrointestinal:   No nausea, no vomiting.  No diarrhea.  No constipation. Musculoskeletal: Negative for musculoskeletal pain. Skin: Negative for rash, abrasions, lacerations, ecchymosis.    ____________________________________________   PHYSICAL EXAM:  VITAL SIGNS: ED Triage Vitals  Enc Vitals Group      BP 08/04/17 2144 (!) 148/89     Pulse Rate 08/04/17 1934 (!) 110     Resp 08/04/17 1934 16     Temp 08/04/17 1934 97.8 F (36.6 C)     Temp Source 08/04/17 1934 Oral     SpO2 08/04/17 1934 96 %     Weight 08/04/17 1941 190 lb (86.2 kg)     Height 08/04/17 1941 5' 10.5" (1.791 m)     Head Circumference --      Peak Flow --      Pain Score 08/04/17 1941 2     Pain Loc --      Pain Edu? --      Excl. in GC? --      Constitutional: Alert and oriented. Well appearing and in no acute distress. Eyes: Conjunctivae are normal. PERRL. EOMI. Head: Atraumatic. ENT:      Ears: Patient has tinnitus of right ear.  Right tympanic membrane is effused. left tympanic membrane is occluded by wax.  Nose: No congestion/rhinnorhea.      Mouth/Throat: Mucous membranes are moist.  Hematological/Lymphatic/Immunilogical: No cervical lymphadenopathy. Cardiovascular: Normal rate, regular rhythm. Normal S1 and S2.  Good peripheral circulation. Respiratory: Normal respiratory effort without tachypnea or retractions. Lungs CTAB. Good air entry to the bases with no decreased or absent breath sounds Musculoskeletal: Full range of motion to all extremities. No obvious deformities noted Neurologic:  Normal for age. No gross focal neurologic deficits are appreciated.  Skin:  Skin is warm, dry and intact. No rash noted.  ____________________________________________   LABS (all labs ordered are listed, but only abnormal results are displayed)  Labs Reviewed - No data to display ____________________________________________  EKG   ____________________________________________  RADIOLOGY   No results found.  ____________________________________________    PROCEDURES  Procedure(s) performed:     Procedures     Medications - No data to display   ____________________________________________   INITIAL IMPRESSION / ASSESSMENT AND PLAN / ED COURSE  Pertinent labs & imaging results that  were available during my care of the patient were reviewed by me and considered in my medical decision making (see chart for details).     Assessment and plan Schizophrenia Patient presents to the emergency department with tinnitus.  Differential diagnosis included auditory hallucinations versus true tinnitus.  Patient's right tympanic membrane was effused.  Patient was treated empirically with prednisone.  Vital signs were reassuring prior to discharge.  All patient questions were answered.     ____________________________________________  FINAL CLINICAL IMPRESSION(S) / ED DIAGNOSES  Final diagnoses:  Tinnitus of right ear      NEW MEDICATIONS STARTED DURING THIS VISIT:  ED Discharge Orders        Ordered    predniSONE (DELTASONE) 50 MG tablet     08/04/17 2117          This chart was dictated using voice recognition software/Dragon. Despite best efforts to proofread, errors can occur which can change the meaning. Any change was purely unintentional.     Gasper LloydWoods, Anel Creighton M, PA-C 08/04/17 2229    Myrna BlazerSchaevitz, David Matthew, MD 08/04/17 [phone]

## 2017-08-04 NOTE — ED Notes (Signed)
Pt states " when im sitting ina room by myself I have a ringing in my ear and it sounds like a ragging sea that is to loud. And I am hallucinating and seeing electrical currents floating through the air." pt states he stuck a q tip in right ear around 3 days ago and states " I think I ruptured it or something because it has been ringing ever since then" Pt denies any pain at this time.

## 2017-08-04 NOTE — ED Notes (Addendum)
Contacted legal guardian vivian harris regarding tx plan and d/c. Verbal understanding and consent via telephone. Doristine MangoClement (caregiver) states ETA to pick pt up for s/c approx 1 hour. Pt to remain in tx room awaiting ride

## 2017-08-04 NOTE — ED Notes (Signed)
Pt d/c with Lance [name]

## 2017-08-04 NOTE — ED Triage Notes (Signed)
Pt to ED reporting ringing in right ear for the past three days with pain. Pt reports the ringing is "deep" in his ear. PT report she had used a Qtip this week but unknown if this is the cause of the pain.   Pt also has a psych hx and is reporting that he is seeing "flashes if electricity" more lately then before. RN attempting to ask pt about the lights and pt is not making much sense. RN asked if the pt was scared of the lights and pt responded, "Well you know sometimes when I see them I want to get close to someone." Pt reports she does not want to be seen by psych and is just in ED for ears. Pt denies SI or HI.

## 2017-08-11 DIAGNOSIS — E1141 Type 2 diabetes mellitus with diabetic mononeuropathy: Secondary | ICD-10-CM | POA: Diagnosis not present

## 2017-08-11 DIAGNOSIS — H6122 Impacted cerumen, left ear: Secondary | ICD-10-CM | POA: Diagnosis not present

## 2017-08-11 DIAGNOSIS — Z8659 Personal history of other mental and behavioral disorders: Secondary | ICD-10-CM | POA: Diagnosis not present

## 2017-08-11 DIAGNOSIS — I2782 Chronic pulmonary embolism: Secondary | ICD-10-CM | POA: Diagnosis not present

## 2017-08-11 DIAGNOSIS — I2699 Other pulmonary embolism without acute cor pulmonale: Secondary | ICD-10-CM | POA: Diagnosis not present

## 2017-08-16 DIAGNOSIS — F209 Schizophrenia, unspecified: Secondary | ICD-10-CM | POA: Diagnosis not present

## 2017-08-16 DIAGNOSIS — Z0000 Encounter for general adult medical examination without abnormal findings: Secondary | ICD-10-CM | POA: Diagnosis not present

## 2017-09-08 DIAGNOSIS — E1141 Type 2 diabetes mellitus with diabetic mononeuropathy: Secondary | ICD-10-CM | POA: Diagnosis not present

## 2017-09-08 DIAGNOSIS — I2782 Chronic pulmonary embolism: Secondary | ICD-10-CM | POA: Diagnosis not present

## 2017-09-08 DIAGNOSIS — F250 Schizoaffective disorder, bipolar type: Secondary | ICD-10-CM | POA: Diagnosis not present

## 2017-09-08 DIAGNOSIS — I2699 Other pulmonary embolism without acute cor pulmonale: Secondary | ICD-10-CM | POA: Diagnosis not present

## 2017-09-13 ENCOUNTER — Ambulatory Visit: Payer: Medicare Other | Admitting: Podiatry

## 2017-09-15 DIAGNOSIS — Z79899 Other long term (current) drug therapy: Secondary | ICD-10-CM | POA: Diagnosis not present

## 2017-09-15 DIAGNOSIS — F209 Schizophrenia, unspecified: Secondary | ICD-10-CM | POA: Diagnosis not present

## 2017-09-20 ENCOUNTER — Emergency Department
Admission: EM | Admit: 2017-09-20 | Discharge: 2017-09-20 | Disposition: A | Discharge status: Home / Self Care | Payer: Medicare Other | Attending: Emergency Medicine | Admitting: Emergency Medicine

## 2017-09-20 ENCOUNTER — Other Ambulatory Visit: Payer: Self-pay

## 2017-09-20 ENCOUNTER — Encounter: Payer: Self-pay | Admitting: Emergency Medicine

## 2017-09-20 ENCOUNTER — Inpatient Hospital Stay
Admission: AD | Admit: 2017-09-20 | Discharge: 2017-09-24 | DRG: 885 | Payer: Medicare Other | Attending: Psychiatry | Admitting: Psychiatry

## 2017-09-20 DIAGNOSIS — F209 Schizophrenia, unspecified: Secondary | ICD-10-CM | POA: Diagnosis present

## 2017-09-20 DIAGNOSIS — R45851 Suicidal ideations: Secondary | ICD-10-CM | POA: Diagnosis present

## 2017-09-20 DIAGNOSIS — E119 Type 2 diabetes mellitus without complications: Secondary | ICD-10-CM | POA: Diagnosis present

## 2017-09-20 DIAGNOSIS — K5900 Constipation, unspecified: Secondary | ICD-10-CM | POA: Diagnosis present

## 2017-09-20 DIAGNOSIS — E785 Hyperlipidemia, unspecified: Secondary | ICD-10-CM | POA: Diagnosis present

## 2017-09-20 DIAGNOSIS — G4700 Insomnia, unspecified: Secondary | ICD-10-CM | POA: Diagnosis present

## 2017-09-20 DIAGNOSIS — D6862 Lupus anticoagulant syndrome: Secondary | ICD-10-CM | POA: Diagnosis present

## 2017-09-20 DIAGNOSIS — Z86711 Personal history of pulmonary embolism: Secondary | ICD-10-CM | POA: Diagnosis not present

## 2017-09-20 DIAGNOSIS — F259 Schizoaffective disorder, unspecified: Secondary | ICD-10-CM | POA: Diagnosis present

## 2017-09-20 DIAGNOSIS — Z79899 Other long term (current) drug therapy: Secondary | ICD-10-CM

## 2017-09-20 DIAGNOSIS — F17210 Nicotine dependence, cigarettes, uncomplicated: Secondary | ICD-10-CM | POA: Diagnosis present

## 2017-09-20 DIAGNOSIS — F17200 Nicotine dependence, unspecified, uncomplicated: Secondary | ICD-10-CM | POA: Diagnosis present

## 2017-09-20 DIAGNOSIS — F203 Undifferentiated schizophrenia: Secondary | ICD-10-CM | POA: Diagnosis not present

## 2017-09-20 DIAGNOSIS — I10 Essential (primary) hypertension: Secondary | ICD-10-CM | POA: Diagnosis present

## 2017-09-20 DIAGNOSIS — F250 Schizoaffective disorder, bipolar type: Secondary | ICD-10-CM | POA: Diagnosis not present

## 2017-09-20 DIAGNOSIS — Z7951 Long term (current) use of inhaled steroids: Secondary | ICD-10-CM

## 2017-09-20 DIAGNOSIS — Z794 Long term (current) use of insulin: Secondary | ICD-10-CM | POA: Diagnosis not present

## 2017-09-20 DIAGNOSIS — J449 Chronic obstructive pulmonary disease, unspecified: Secondary | ICD-10-CM | POA: Diagnosis present

## 2017-09-20 DIAGNOSIS — Z7901 Long term (current) use of anticoagulants: Secondary | ICD-10-CM

## 2017-09-20 DIAGNOSIS — F319 Bipolar disorder, unspecified: Secondary | ICD-10-CM | POA: Diagnosis present

## 2017-09-20 DIAGNOSIS — F329 Major depressive disorder, single episode, unspecified: Secondary | ICD-10-CM | POA: Insufficient documentation

## 2017-09-20 LAB — CBC WITH DIFFERENTIAL/PLATELET
Basophils Absolute: 0 10*3/uL (ref 0–0.1)
Basophils Relative: 0 %
EOS ABS: 0 10*3/uL (ref 0–0.7)
EOS PCT: 0 %
HCT: 45.1 % (ref 40.0–52.0)
Hemoglobin: 15.5 g/dL (ref 13.0–18.0)
LYMPHS ABS: 1.4 10*3/uL (ref 1.0–3.6)
Lymphocytes Relative: 24 %
MCH: 31.7 pg (ref 26.0–34.0)
MCHC: 34.4 g/dL (ref 32.0–36.0)
MCV: 92.1 fL (ref 80.0–100.0)
MONOS PCT: 13 %
Monocytes Absolute: 0.8 10*3/uL (ref 0.2–1.0)
Neutro Abs: 3.8 10*3/uL (ref 1.4–6.5)
Neutrophils Relative %: 63 %
PLATELETS: 206 10*3/uL (ref 150–440)
RBC: 4.9 MIL/uL (ref 4.40–5.90)
RDW: 13.2 % (ref 11.5–14.5)
WBC: 6 10*3/uL (ref 3.8–10.6)

## 2017-09-20 LAB — PROTIME-INR
INR: 1.43
PROTHROMBIN TIME: 17.3 s — AB (ref 11.4–15.2)

## 2017-09-20 LAB — COMPREHENSIVE METABOLIC PANEL
ALK PHOS: 50 U/L (ref 38–126)
ALT: 24 U/L (ref 17–63)
ANION GAP: 11 (ref 5–15)
AST: 33 U/L (ref 15–41)
Albumin: 5.1 g/dL — ABNORMAL HIGH (ref 3.5–5.0)
BUN: 18 mg/dL (ref 6–20)
CHLORIDE: 107 mmol/L (ref 101–111)
CO2: 24 mmol/L (ref 22–32)
Calcium: 10.5 mg/dL — ABNORMAL HIGH (ref 8.9–10.3)
Creatinine, Ser: 1.15 mg/dL (ref 0.61–1.24)
GFR calc non Af Amer: >60 mL/min (ref >60)
GLUCOSE: 160 mg/dL — AB (ref 65–99)
POTASSIUM: 3.9 mmol/L (ref 3.5–5.1)
SODIUM: 142 mmol/L (ref 135–145)
Total Bilirubin: 0.7 mg/dL (ref 0.3–1.2)
Total Protein: 7.6 g/dL (ref 6.5–8.1)

## 2017-09-20 LAB — CBC
HEMATOCRIT: 47.8 % (ref 40.0–52.0)
HEMOGLOBIN: 16.5 g/dL (ref 13.0–18.0)
MCH: 31.9 pg (ref 26.0–34.0)
MCHC: 34.4 g/dL (ref 32.0–36.0)
MCV: 92.5 fL (ref 80.0–100.0)
Platelets: 239 10*3/uL (ref 150–440)
RBC: 5.16 MIL/uL (ref 4.40–5.90)
RDW: 13.3 % (ref 11.5–14.5)
WBC: 7.9 10*3/uL (ref 3.8–10.6)

## 2017-09-20 MED ORDER — IPRATROPIUM BROMIDE HFA 17 MCG/ACT IN AERS
2.0000 | INHALATION_SPRAY | Freq: Every day | RESPIRATORY_TRACT | Status: DC
  Filled 2017-09-20: qty 12.9

## 2017-09-20 MED ORDER — WARFARIN SODIUM 6 MG PO TABS: 6.0000 mg | ORAL_TABLET | Freq: Every day | ORAL | Status: DC

## 2017-09-20 MED ORDER — SIMVASTATIN 40 MG PO TABS
40.0000 mg | ORAL_TABLET | Freq: Every day | ORAL | Status: DC
  Administered 2017-09-21 – 2017-09-23 (×3): 40 mg via ORAL
  Filled 2017-09-20 (×3): qty 1

## 2017-09-20 MED ORDER — INSULIN ASPART 100 UNIT/ML ~~LOC~~ SOLN
0.0000 [IU] | Freq: Every day | SUBCUTANEOUS | Status: DC
  Administered 2017-09-21: 0 [IU] via SUBCUTANEOUS

## 2017-09-20 MED ORDER — IPRATROPIUM BROMIDE 0.06 % NA SOLN
1.0000 | Freq: Every day | NASAL | Status: DC
  Administered 2017-09-20 – 2017-09-23 (×4): 1 via NASAL
  Filled 2017-09-20: qty 15

## 2017-09-20 MED ORDER — ALBUTEROL SULFATE HFA 108 (90 BASE) MCG/ACT IN AERS: 2.0000 | INHALATION_SPRAY | Freq: Four times a day (QID) | RESPIRATORY_TRACT | Status: DC | PRN

## 2017-09-20 MED ORDER — INSULIN DETEMIR 100 UNIT/ML ~~LOC~~ SOLN: 14.0000 [IU] | Freq: Every day | SUBCUTANEOUS | Status: DC

## 2017-09-20 MED ORDER — BUPROPION HCL ER (XL) 150 MG PO TB24
150.0000 mg | ORAL_TABLET | Freq: Every day | ORAL | Status: DC
  Administered 2017-09-21 – 2017-09-24 (×4): 150 mg via ORAL
  Filled 2017-09-20 (×4): qty 1

## 2017-09-20 MED ORDER — INSULIN DETEMIR 100 UNIT/ML ~~LOC~~ SOLN
14.0000 [IU] | Freq: Every day | SUBCUTANEOUS | Status: DC
  Administered 2017-09-21 – 2017-09-24 (×4): 14 [IU] via SUBCUTANEOUS
  Filled 2017-09-20 (×4): qty 0.14

## 2017-09-20 MED ORDER — TRAZODONE HCL 100 MG PO TABS: 100.0000 mg | ORAL_TABLET | Freq: Every day | ORAL | Status: DC

## 2017-09-20 MED ORDER — LISINOPRIL 5 MG PO TABS: 2.5000 mg | ORAL_TABLET | Freq: Every day | ORAL | Status: DC

## 2017-09-20 MED ORDER — MOMETASONE FURO-FORMOTEROL FUM 200-5 MCG/ACT IN AERO
2.0000 | INHALATION_SPRAY | Freq: Two times a day (BID) | RESPIRATORY_TRACT | Status: DC
Start: 2017-09-20 — End: 2017-09-24
  Administered 2017-09-20 – 2017-09-24 (×8): 2 via RESPIRATORY_TRACT
  Filled 2017-09-20: qty 8.8

## 2017-09-20 MED ORDER — DOCUSATE SODIUM 100 MG PO CAPS
200.0000 mg | ORAL_CAPSULE | Freq: Two times a day (BID) | ORAL | Status: DC
  Administered 2017-09-20 – 2017-09-24 (×8): 200 mg via ORAL
  Filled 2017-09-20 (×8): qty 2

## 2017-09-20 MED ORDER — CLOZAPINE 100 MG PO TABS
500.0000 mg | ORAL_TABLET | Freq: Every day | ORAL | Status: DC
  Administered 2017-09-20 – 2017-09-23 (×4): 500 mg via ORAL
  Filled 2017-09-20 (×4): qty 5

## 2017-09-20 MED ORDER — LITHIUM CARBONATE ER 450 MG PO TBCR: 450.0000 mg | EXTENDED_RELEASE_TABLET | Freq: Two times a day (BID) | ORAL | Status: DC

## 2017-09-20 MED ORDER — WARFARIN SODIUM 10 MG PO TABS
10.0000 mg | ORAL_TABLET | Freq: Every day | ORAL | Status: DC
  Administered 2017-09-20 – 2017-09-23 (×4): 10 mg via ORAL
  Filled 2017-09-20 (×5): qty 1

## 2017-09-20 MED ORDER — ACETAMINOPHEN 325 MG PO TABS: 650.0000 mg | ORAL_TABLET | Freq: Four times a day (QID) | ORAL | Status: DC | PRN

## 2017-09-20 MED ORDER — MOMETASONE FURO-FORMOTEROL FUM 200-5 MCG/ACT IN AERO: 2.0000 | INHALATION_SPRAY | Freq: Two times a day (BID) | RESPIRATORY_TRACT | Status: DC

## 2017-09-20 MED ORDER — SIMVASTATIN 40 MG PO TABS: 40.0000 mg | ORAL_TABLET | Freq: Every day | ORAL | Status: DC

## 2017-09-20 MED ORDER — POLYETHYLENE GLYCOL 3350 17 G PO PACK: 17.0000 g | PACK | Freq: Every day | ORAL | Status: DC

## 2017-09-20 MED ORDER — INSULIN ASPART 100 UNIT/ML ~~LOC~~ SOLN
0.0000 [IU] | Freq: Three times a day (TID) | SUBCUTANEOUS | Status: DC
  Administered 2017-09-21 – 2017-09-23 (×2): 2 [IU] via SUBCUTANEOUS
  Administered 2017-09-23 – 2017-09-24 (×2): 3 [IU] via SUBCUTANEOUS
  Filled 2017-09-20: qty 1

## 2017-09-20 MED ORDER — WARFARIN SODIUM 10 MG PO TABS: 10.0000 mg | ORAL_TABLET | Freq: Every morning | ORAL | Status: DC

## 2017-09-20 MED ORDER — NICOTINE 21 MG/24HR TD PT24
21.0000 mg | MEDICATED_PATCH | Freq: Every day | TRANSDERMAL | Status: DC
  Administered 2017-09-22 – 2017-09-24 (×3): 21 mg via TRANSDERMAL
  Filled 2017-09-20 (×4): qty 1

## 2017-09-20 MED ORDER — LITHIUM CARBONATE ER 450 MG PO TBCR
450.0000 mg | EXTENDED_RELEASE_TABLET | Freq: Two times a day (BID) | ORAL | Status: DC
  Administered 2017-09-20 – 2017-09-24 (×8): 450 mg via ORAL
  Filled 2017-09-20 (×8): qty 1

## 2017-09-20 MED ORDER — TRAZODONE HCL 100 MG PO TABS
100.0000 mg | ORAL_TABLET | Freq: Every day | ORAL | Status: DC
  Administered 2017-09-20 – 2017-09-23 (×4): 100 mg via ORAL
  Filled 2017-09-20 (×4): qty 1

## 2017-09-20 MED ORDER — METFORMIN HCL 500 MG PO TABS
500.0000 mg | ORAL_TABLET | Freq: Two times a day (BID) | ORAL | Status: DC
  Administered 2017-09-21 – 2017-09-24 (×7): 500 mg via ORAL
  Filled 2017-09-20 (×7): qty 1

## 2017-09-20 MED ORDER — DOCUSATE SODIUM 100 MG PO CAPS: 200.0000 mg | ORAL_CAPSULE | Freq: Two times a day (BID) | ORAL | Status: DC

## 2017-09-20 MED ORDER — POLYETHYLENE GLYCOL 3350 17 G PO PACK
17.0000 g | PACK | Freq: Every day | ORAL | Status: DC
  Administered 2017-09-21 – 2017-09-22 (×2): 17 g via ORAL
  Filled 2017-09-20 (×4): qty 1

## 2017-09-20 MED ORDER — IPRATROPIUM BROMIDE HFA 17 MCG/ACT IN AERS: 2.0000 | INHALATION_SPRAY | Freq: Every day | RESPIRATORY_TRACT | Status: DC

## 2017-09-20 MED ORDER — METFORMIN HCL 500 MG PO TABS: 500.0000 mg | ORAL_TABLET | Freq: Two times a day (BID) | ORAL | Status: DC

## 2017-09-20 MED ORDER — MAGNESIUM HYDROXIDE 400 MG/5ML PO SUSP: 30.0000 mL | Freq: Every day | ORAL | Status: DC | PRN

## 2017-09-20 MED ORDER — LISINOPRIL 2.5 MG PO TABS
2.5000 mg | ORAL_TABLET | Freq: Every day | ORAL | Status: DC
  Administered 2017-09-21 – 2017-09-23 (×3): 2.5 mg via ORAL
  Filled 2017-09-20 (×4): qty 1

## 2017-09-20 MED ORDER — CLOZAPINE 100 MG PO TABS: 500.0000 mg | ORAL_TABLET | Freq: Every day | ORAL | Status: DC

## 2017-09-20 MED ORDER — WARFARIN - PHARMACIST DOSING INPATIENT
Freq: Every day | Status: DC
  Administered 2017-09-21 – 2017-09-23 (×3)

## 2017-09-20 MED ORDER — BUPROPION HCL ER (XL) 150 MG PO TB24: 150.0000 mg | ORAL_TABLET | Freq: Every day | ORAL | Status: DC

## 2017-09-20 MED ORDER — ALUM & MAG HYDROXIDE-SIMETH 200-200-20 MG/5ML PO SUSP: 30.0000 mL | ORAL | Status: DC | PRN

## 2017-09-20 NOTE — Progress Notes (Signed)
MEDICATION RELATED CONSULT NOTE   Pharmacy Consult for Clozapine Monitoring Indication: Schizophrenia  Allergies  Allergen Reactions  . Penicillins Other (See Comments)    Reaction: "lockjaw" Has patient had a PCN reaction causing immediate rash, facial/tongue/throat swelling, SOB or lightheadedness with hypotension: No Has patient had a PCN reaction causing severe rash involving mucus membranes or skin necrosis: No Has patient had a PCN reaction that required hospitalization: No Has patient had a PCN reaction occurring within the last 10 years: No If all of the above answers are "NO", then may proceed with Cephalosporin use.     Labs: Recent Labs    09/20/17 1316 09/20/17 1915  WBC 7.9 6.0  HGB 16.5 15.5  HCT 47.8 45.1  PLT 239 206  CREATININE 1.15  --   ALBUMIN 5.1*  --   PROT 7.6  --   AST 33  --   ALT 24  --   ALKPHOS 50  --   BILITOT 0.7  --    Estimated Creatinine Clearance: 85.5 mL/min (by C-G formula based on SCr of 1.15 mg/dL).   Assessment: Patient ordered Clozapine 500mg  PO at bedtime as per home dose.   Plan:  4/8: ANC = 3800. Labs reported to Clozapine Registry.    Next labs due 09/27/17.    Stormy CardKatsoudas,Elene Downum K, [facility]PH 09/20/2017,8:13 PM

## 2017-09-20 NOTE — Consult Note (Signed)
[place] [facility] Consult   Reason for Consult: Consult for 42 year old man with a history of schizophrenia who comes to the emergency room saying he is paranoid and hearing voices Referring Physician: Corky Downs Patient Identification: Joshoa Shawler MRN:  [id number] Principal Diagnosis: Schizoaffective disorder, bipolar type [facility]) Diagnosis:   Patient Active Problem List   Diagnosis Date Noted  . Tobacco use disorder [F17.200] 09/01/2016  . Schizoaffective disorder, bipolar type ([place]) [F25.0] 05/22/2016  . Diabetes mellitus without complication ([place]) [T06.2] 05/21/2016  . Hyperlipidemia [E78.5] 05/21/2016  . History of pulmonary embolism [Z86.711] 05/21/2016  . Chronic anticoagulation [Z79.01] 05/21/2016  . Hypertension [I10] 09/25/2015    Total Time spent with patient: 1 hour  Subjective:   Kyro Joswick is a 42 y.o. male patient admitted with "I am hearing voices real bad".  HPI: Patient interviewed chart reviewed.  Patient well known from previous encounters.  He says he is hearing a lot of voices and seeing things "that I should not see".  He says the voices tell him things "that I should not do".  He is clearly agitated and disturbed by this.  It is difficult to get him to give a clear time course for his symptoms.  When asked how long this is been going on he would tell you months or years but it is clear that something is worse recently.  He says he has been taking all of his medicine.  Denies that he has been using drugs or alcohol.  Denies any particular new stressor.  He says he is sleeping poorly at night.  Having thoughts about death or dying and thinks the voices are directing him.  Medical history: Multiple medical problems including diabetes high blood pressure dyslipidemia hypothyroidism and chronic anticoagulation  Social history: Patient's mother died not too long ago which has left him particularly without much support.  He does have a legal  guardian.  Substance abuse history: Past history of cannabis abuse which he mostly seems to have put behind him.  Past Psychiatric History: Multiple previous hospitalizations for schizophrenia or schizoaffective disorder.  Frequently presents confused hallucinating disorganized poor self-care and having suicidal thoughts.  Supposed to be maintained currently on mood stabilizers and strong antipsychotics  Risk to Self: Is patient at risk for suicide?: Yes Risk to Others:   Prior Inpatient Therapy:   Prior Outpatient Therapy:    Past Medical History:  Past Medical History:  Diagnosis Date  . Depression   . Diabetes mellitus without complication ([place])   . Hyperlipidemia   . Hypertension   . Lupus anticoagulant disorder ([place]) 06/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  . PE (pulmonary thromboembolism) ([place])   . Schizo affective schizophrenia ([place])   . Supratherapeutic INR 11/19/2016   History reviewed. No pertinent surgical history. Family History:  Family History  Problem Relation Age of Onset  . CAD Mother   . CAD Sister    Family Psychiatric  History: Unknown Social History:  Social History   Substance and Sexual Activity  Alcohol Use No   Comment: occassionally     Social History   Substance and Sexual Activity  Drug Use Yes  . Types: Marijuana   Comment: Last use 11/29/16    Social History   Socioeconomic History  . Marital status: Single    Spouse name: Not on file  . Number of children: Not on file  . Years of education: Not on file  . Highest education level: Not on  file  Occupational History  . Not on file  Social Needs  . Financial resource strain: Not on file  . Food insecurity:    Worry: Not on file    Inability: Not on file  . Transportation needs:    Medical: Not on file    Non-medical: Not on file  Tobacco Use  . Smoking status: Current Every Day Smoker    Packs/day: 1.00    Years: 23.00    Pack years: 23.00    Types: Cigarettes  .  Smokeless tobacco: Never Used  Substance and Sexual Activity  . Alcohol use: No    Comment: occassionally  . Drug use: Yes    Types: Marijuana    Comment: Last use 11/29/16  . Sexual activity: Never    Birth control/protection: None    Comment: occasional marijuana- none recently  Lifestyle  . Physical activity:    Days per week: Not on file    Minutes per session: Not on file  . Stress: Not on file  Relationships  . Social connections:    Talks on phone: Not on file    Gets together: Not on file    Attends religious service: Not on file    Active member of club or organization: Not on file    Attends meetings of clubs or organizations: Not on file    Relationship status: Not on file  Other Topics Concern  . Not on file  Social History Narrative   From a group home in [place]   Additional Social History:    Allergies:   Allergies  Allergen Reactions  . Penicillins Other (See Comments)    Reaction: "lockjaw" Has patient had a PCN reaction causing immediate rash, facial/tongue/throat swelling, SOB or lightheadedness with hypotension: No Has patient had a PCN reaction causing severe rash involving mucus membranes or skin necrosis: No Has patient had a PCN reaction that required hospitalization: No Has patient had a PCN reaction occurring within the last 10 years: No If all of the above answers are "NO", then may proceed with Cephalosporin use.     Labs:  Results for orders placed or performed during the hospital encounter of 09/20/17 (from the past 48 hour(s))  Comprehensive metabolic panel     Status: Abnormal   Collection Time: 09/20/17  1:16 PM  Result Value Ref Range   Sodium 142 135 - 145 mmol/L   Potassium 3.9 3.5 - 5.1 mmol/L   Chloride 107 101 - 111 mmol/L   CO2 24 22 - 32 mmol/L   Glucose, Bld 160 (H) 65 - 99 mg/dL   BUN 18 6 - 20 mg/dL   Creatinine, Ser 1.15 0.61 - 1.24 mg/dL   Calcium 10.5 (H) 8.9 - 10.3 mg/dL   Total Protein 7.6 6.5 - 8.1 g/dL    Albumin 5.1 (H) 3.5 - 5.0 g/dL   AST 33 15 - 41 U/L   ALT 24 17 - 63 U/L   Alkaline Phosphatase 50 38 - 126 U/L   Total Bilirubin 0.7 0.3 - 1.2 mg/dL   GFR calc non Af Amer >60 >60 mL/min   GFR calc Af Amer >60 >60 mL/min    Comment: (NOTE) The eGFR has been calculated using the CKD EPI equation. This calculation has not been validated in all clinical situations. eGFR's persistently <60 mL/min signify possible Chronic Kidney Disease.    Anion gap 11 5 - 15    Comment: Performed at [facility], [place],  [place] [address]  cbc     Status: None   Collection Time: 09/20/17  1:16 PM  Result Value Ref Range   WBC 7.9 3.8 - 10.6 K/uL   RBC 5.16 4.40 - 5.90 MIL/uL   Hemoglobin 16.5 13.0 - 18.0 g/dL   HCT 47.8 40.0 - 52.0 %   MCV 92.5 80.0 - 100.0 fL   MCH 31.9 26.0 - 34.0 pg   MCHC 34.4 32.0 - 36.0 g/dL   RDW 13.3 11.5 - 14.5 %   Platelets 239 150 - 440 K/uL    Comment: Performed at [facility], [address] [place], [place] [address]    Current Facility-Administered Medications  Medication Dose Route Frequency Provider Last Rate Last Dose  . albuterol (PROVENTIL HFA;VENTOLIN HFA) 108 (90 Base) MCG/ACT inhaler 2 puff  2 puff Inhalation Q6H PRN Maximilien Hayashi T, MD      . buPROPion (WELLBUTRIN XL) 24 hr tablet 150 mg  150 mg Oral Daily Amyri Frenz T, MD      . cloZAPine (CLOZARIL) tablet 500 mg  500 mg Oral QHS Jeniya Flannigan T, MD      . docusate sodium (COLACE) capsule 200 mg  200 mg Oral BID Priscella Donna T, MD      . insulin detemir (LEVEMIR) injection 14 Units  14 Units Subcutaneous Daily Sivan Cuello T, MD      . ipratropium (ATROVENT HFA) inhaler 2 puff  2 puff Inhalation QHS Caydee Talkington T, MD      . lisinopril (PRINIVIL,ZESTRIL) tablet 2.5 mg  2.5 mg Oral Daily Abigaile Rossie T, MD      . lithium carbonate (ESKALITH) CR tablet 450 mg  450 mg Oral Q12H Keyara Ent T, MD      . metFORMIN (GLUCOPHAGE) tablet 500 mg  500 mg Oral BID WC  Janna Oak T, MD      . mometasone-formoterol (DULERA) 200-5 MCG/ACT inhaler 2 puff  2 puff Inhalation BID Indi Willhite T, MD      . polyethylene glycol (MIRALAX / GLYCOLAX) packet 17 g  17 g Oral Daily Vardaan Depascale T, MD      . simvastatin (ZOCOR) tablet 40 mg  40 mg Oral q1800 Dartanyon Frankowski T, MD      . traZODone (DESYREL) tablet 100 mg  100 mg Oral QHS Landen Knoedler T, MD      . Derrill Memo ON 09/21/2017] warfarin (COUMADIN) tablet 10 mg  10 mg Oral q morning - 10a Gisselle Galvis T, MD      . warfarin (COUMADIN) tablet 6 mg  6 mg Oral q1800 Amerika Nourse, Madie Reno, MD       Current Outpatient Medications  Medication Sig Dispense Refill  . albuterol (PROVENTIL HFA;VENTOLIN HFA) 108 (90 Base) MCG/ACT inhaler Inhale 1-2 puffs into the lungs every 4 (four) hours as needed for wheezing or shortness of breath. 1 Inhaler 0  . buPROPion (WELLBUTRIN XL) 150 MG 24 hr tablet Take 1 tablet (150 mg total) by mouth daily. 30 tablet 0  . clindamycin (CLEOCIN) 150 MG capsule Take 2 capsules every 6 hours for infection 56 capsule 0  . cloZAPine (CLOZARIL) 100 MG tablet Take 5 tablets (500 mg total) by mouth at bedtime. 150 tablet 0  . docusate sodium (COLACE) 100 MG capsule Take 2 capsules (200 mg total) by mouth 2 (two) times daily. 120 capsule 0  . Fluticasone-Salmeterol (ADVAIR) 250-50 MCG/DOSE AEPB Inhale 1 puff into the lungs 2 (two) times daily.    Marland Kitchen  HYDROcodone-acetaminophen (NORCO/VICODIN) 5-325 MG tablet Take 1 tablet by mouth every 6 (six) hours as needed for moderate pain. 8 tablet 0  . insulin detemir (LEVEMIR) 100 UNIT/ML injection Inject 14 Units into the skin every morning.    Marland Kitchen ipratropium (ATROVENT) 0.06 % nasal spray Place 1 spray into both nostrils at bedtime. 3 mL 0  . lisinopril (PRINIVIL,ZESTRIL) 2.5 MG tablet Take 2.5 mg by mouth daily.    Marland Kitchen lithium carbonate (ESKALITH) 450 MG CR tablet Take 1 tablet (450 mg total) by mouth every 12 (twelve) hours. 60 tablet 0  . metformin (FORTAMET) 500 MG (OSM)  24 hr tablet Take 500 mg by mouth 2 (two) times daily with a meal.    . mometasone-formoterol (DULERA) 200-5 MCG/ACT AERO Inhale 2 puffs into the lungs 2 (two) times daily. 1 Inhaler 1  . omega-3 acid ethyl esters (LOVAZA) 1 g capsule Take 1 g by mouth daily.    . ondansetron (ZOFRAN ODT) 4 MG disintegrating tablet Take 1 tablet (4 mg total) by mouth every 8 (eight) hours as needed for nausea or vomiting. 20 tablet 0  . polyethylene glycol (MIRALAX / GLYCOLAX) packet Take 17 g by mouth daily. 30 each 0  . predniSONE (DELTASONE) 50 MG tablet Take one 50 mg tablet daily for the next five days. 5 tablet 0  . simvastatin (ZOCOR) 40 MG tablet Take 40 mg by mouth at bedtime.    . traZODone (DESYREL) 50 MG tablet Take 75 mg by mouth at bedtime.    Marland Kitchen warfarin (COUMADIN) 10 MG tablet Take 10 mg by mouth daily.    Marland Kitchen warfarin (COUMADIN) 5 MG tablet Take 1 tablet (5 mg total) by mouth daily at 6 PM. 30 tablet 0    Musculoskeletal: Strength & Muscle Tone: within normal limits Gait & Station: normal Patient leans: N/A  Psychiatric Specialty Exam: Physical Exam  Nursing note and vitals reviewed. Constitutional: He appears well-developed and well-nourished.  HENT:  Head: Normocephalic and atraumatic.  Eyes: Pupils are equal, round, and reactive to light. Conjunctivae are normal.  Neck: Normal range of motion.  Cardiovascular: Regular rhythm and normal heart sounds.  Respiratory: Effort normal. No respiratory distress.  GI: Soft.  Musculoskeletal: Normal range of motion.  Neurological: He is alert.  Skin: Skin is warm and dry.  Psychiatric: His affect is blunt. His speech is delayed and tangential. He is agitated. He is not aggressive. Thought content is paranoid. Cognition and memory are impaired. He expresses impulsivity. He expresses suicidal ideation. He expresses no homicidal ideation. He exhibits abnormal recent memory.    Review of Systems  Constitutional: Negative.   HENT: Negative.    Eyes: Negative.   Respiratory: Negative.   Cardiovascular: Negative.   Gastrointestinal: Negative.   Musculoskeletal: Negative.   Skin: Negative.   Neurological: Negative.   Psychiatric/Behavioral: Positive for hallucinations, memory loss and suicidal ideas. Negative for depression and substance abuse. The patient is nervous/anxious and has insomnia.     Blood pressure 101/71, pulse 90, temperature 98.4 F (36.9 C), temperature source Oral, resp. rate 18, height '5\' 10"'  (1.778 m), weight 83.9 kg (185 lb), SpO2 96 %.Body mass index is 26.54 kg/m.  General Appearance: Disheveled  Eye Contact:  Fair  Speech:  Slow  Volume:  Decreased  Mood:  Dysphoric  Affect:  Constricted  Thought Process:  Disorganized  Orientation:  Full (Time, Place, and Person)  Thought Content:  Illogical  Suicidal Thoughts:  No  Homicidal Thoughts:  No  Memory:  Immediate;   Fair Recent;   Fair Remote;   Fair  Judgement:  Fair  Insight:  Fair  Psychomotor Activity:  Decreased  Concentration:  Concentration: Fair  Recall:  [employer] of Knowledge:  Fair  Language:  Fair  Akathisia:  No  Handed:  Right  AIMS (if indicated):     Assets:  Desire for Improvement  ADL's:  Impaired  Cognition:  Impaired,  Mild  Sleep:        Treatment Plan Summary: Daily contact with patient to assess and evaluate symptoms and progress in treatment, Medication management and Plan 42 year old man with schizophrenia.  Looks very distressed and frightened.  Having worsening hallucinations.  Patient will be admitted to the psychiatry ward.  Labs all evaluated.  Full set of labs will be done.  Continue current medicines as an outpatient.  Follow-up with anticoagulation studies.  Case reviewed with emergency room physician.  Disposition: Recommend psychiatric Inpatient admission when medically cleared. Supportive therapy provided about ongoing stressors.  Alethia Berthold, MD 09/20/2017 4:28 PM

## 2017-09-20 NOTE — Tx Team (Signed)
Initial Treatment Plan 09/20/2017 6:13 PM Lance [name] NFA:[id number]    PATIENT STRESSORS: Health problems Traumatic event   PATIENT STRENGTHS: Average or above average intelligence Communication skills Motivation for treatment/growth   PATIENT IDENTIFIED PROBLEMS: Auditory hallucinations 09/20/2017  Suicidal ideation 09/20/2017                   DISCHARGE CRITERIA:  Ability to meet basic life and health needs Motivation to continue treatment in a less acute level of care Verbal commitment to aftercare and medication compliance  PRELIMINARY DISCHARGE PLAN: Attend aftercare/continuing care group Return to previous living arrangement  PATIENT/FAMILY INVOLVEMENThis treatment plan has been presented to and reviewed with the patient, Lance [name], and/or family member, . The patient and family have been given the opportunity to ask questions and make suggestions.  Leonarda SalonGigi George Dahlila Pfahler, RN 09/20/2017, 6:13 PM

## 2017-09-20 NOTE — ED Provider Notes (Signed)
Dr. Toni Amendlapacs recommends inpatient psychiatric stabilization.   Lance [name], Kolby Myung, MD 09/20/17 1546

## 2017-09-20 NOTE — ED Triage Notes (Signed)
FIRST NURSE NOTE-here for psych eval.  C/o auditory and visual hallucinations. Voluntary with [place] [employer].  PD waiting with pt in triage area until seen by RN.

## 2017-09-20 NOTE — ED Provider Notes (Signed)
[facility] Emergency Department Provider Note   ____________________________________________    I have reviewed the triage vital signs and the nursing notes.   HISTORY  Chief Complaint Hallucinations and Paranoid     HPI Lance [name] is a 42 y.o. male with a history of schizoaffective schizophrenia who presents with complaints of auditory hallucinations and visual hallucinations.  Patient reports that he has hearing voices and they are telling him to do things that he does not want to do.  He also reports he is occasionally seeing things.  He was walking along the street and felt like the car was moving towards him when in actuality was not.  Denies physical complaints.   Past Medical History:  Diagnosis Date  . Depression   . Diabetes mellitus without complication (HCC)   . Hyperlipidemia   . Hypertension   . Lupus anticoagulant disorder (H[facility]) 06/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  . PE (pulmonary thromboembolism) (H[facility])   . Schizo affective schizophrenia (HCC)   . Supratherapeutic INR 11/19/2016    Patient Active Problem List   Diagnosis Date Noted  . Tobacco use disorder 09/01/2016  . Schizoaffective disorder, bipolar type (H[facility]) 05/22/2016  . Diabetes mellitus without complication ([facility]) 05/21/2016  . Hyperlipidemia 05/21/2016  . History of pulmonary embolism 05/21/2016  . Chronic anticoagulation 05/21/2016  . Hypertension 09/25/2015    History reviewed. No pertinent surgical history.  Prior to Admission medications   Medication Sig Start Date End Date Taking? Authorizing Provider  albuterol (PROVENTIL HFA;VENTOLIN HFA) 108 (90 Base) MCG/ACT inhaler Inhale 1-2 puffs into the lungs every 4 (four) hours as needed for wheezing or shortness of breath. 12/31/16   Jimmy Footman, MD  buPROPion (WELLBUTRIN XL) 150 MG 24 hr tablet Take 1 tablet (150 mg total) by mouth daily. 09/15/16   Jimmy Footman, MD    clindamycin (CLEOCIN) 150 MG capsule Take 2 capsules every 6 hours for infection 04/10/17   Tommi Rumps, PA-C  cloZAPine (CLOZARIL) 100 MG tablet Take 5 tablets (500 mg total) by mouth at bedtime. 12/03/16   Jimmy Footman, MD  docusate sodium (COLACE) 100 MG capsule Take 2 capsules (200 mg total) by mouth 2 (two) times daily. 09/14/16   Jimmy Footman, MD  Fluticasone-Salmeterol (ADVAIR) 250-50 MCG/DOSE AEPB Inhale 1 puff into the lungs 2 (two) times daily.    [provider]  HYDROcodone-acetaminophen (NORCO/VICODIN) 5-325 MG tablet Take 1 tablet by mouth every 6 (six) hours as needed for moderate pain. 04/10/17   Tommi Rumps, PA-C  insulin detemir (LEVEMIR) 100 UNIT/ML injection Inject 14 Units into the skin every morning.    [provider]  ipratropium (ATROVENT) 0.06 % nasal spray Place 1 spray into both nostrils at bedtime. 09/14/16   Jimmy Footman, MD  lisinopril (PRINIVIL,ZESTRIL) 2.5 MG tablet Take 2.5 mg by mouth daily.    [provider]  lithium carbonate (ESKALITH) 450 MG CR tablet Take 1 tablet (450 mg total) by mouth every 12 (twelve) hours. 09/14/16   Jimmy Footman, MD  metformin (FORTAMET) 500 MG (OSM) 24 hr tablet Take 500 mg by mouth 2 (two) times daily with a meal.    [provider]  mometasone-formoterol (DULERA) 200-5 MCG/ACT AERO Inhale 2 puffs into the lungs 2 (two) times daily. 11/20/16   Altamese Dilling, MD  omega-3 acid ethyl esters (LOVAZA) 1 g capsule Take 1 g by mouth daily.    [provider]  ondansetron [facility]  ODT) 4 MG disintegrating tablet Take 1 tablet (4 mg total) by mouth every 8 (eight) hours as needed for nausea or vomiting. 03/17/17   Irean Hong, MD  polyethylene glycol [facility] / Ethelene Hal) packet Take 17 g by mouth daily. 09/15/16   Jimmy Footman, MD  predniSONE (DELTASONE) 50 MG tablet Take one 50 mg tablet daily for the next five days.  08/04/17   Orvil Feil, PA-C  simvastatin (ZOCOR) 40 MG tablet Take 40 mg by mouth at bedtime.    [provider]  traZODone (DESYREL) 50 MG tablet Take 75 mg by mouth at bedtime.    [provider]  warfarin (COUMADIN) 10 MG tablet Take 10 mg by mouth daily.    [provider]  warfarin (COUMADIN) 5 MG tablet Take 1 tablet (5 mg total) by mouth daily at 6 PM. 01/01/17   Jimmy Footman, MD     Allergies Penicillins  Family History  Problem Relation Age of Onset  . CAD Mother   . CAD Sister     Social History Social History   Tobacco Use  . Smoking status: Current Every Day Smoker    Packs/day: 1.00    Years: 23.00    Pack years: 23.00    Types: Cigarettes  . Smokeless tobacco: Never Used  Substance Use Topics  . Alcohol use: No    Comment: occassionally  . Drug use: Yes    Types: Marijuana    Comment: Last use 11/29/16    Review of Systems  Constitutional: No fevers Eyes: Hallucinations ENT: No neck pain Cardiovascular: Denies chest pain. Respiratory: Denies shortness of breath. Gastrointestinal: No abdominal pain.   Genitourinary: Negative for dysuria. Musculoskeletal: Mild chronic back pain Skin: Negative for rash. Neurological: Negative for headaches    ____________________________________________   PHYSICAL EXAM:  VITAL SIGNS: ED Triage Vitals  Enc Vitals Group     BP 09/20/17 1307 (!) 144/95     Pulse Rate 09/20/17 1307 (!) 108     Resp 09/20/17 1307 20     Temp 09/20/17 1307 98.4 F (36.9 C)     Temp Source 09/20/17 1307 Oral     SpO2 09/20/17 1307 97 %     Weight 09/20/17 1309 83.9 kg (185 lb)     Height 09/20/17 1309 1.778 m (5\' 10" )     Head Circumference --      Peak Flow --      Pain Score --      Pain Loc --      Pain Edu? --      Excl. in GC? --     Constitutional: Alert and oriented. Eyes: Conjunctivae are normal.   Nose: No congestion/rhinnorhea. Mouth/Throat: Mucous membranes are  moist.    Cardiovascular: Normal rate, regular rhythm. Grossly normal heart sounds.  Good peripheral circulation. Respiratory: Normal respiratory effort.  No retractions. Lungs CTAB. Gastrointestinal: Soft and nontender. No distention.  No CVA tenderness. Genitourinary: deferred Musculoskeletal: No lower extremity tenderness nor edema.  Warm and well perfused Neurologic:  Normal speech and language. No gross focal neurologic deficits are appreciated.  Skin:  Skin is warm, dry and intact. No rash noted. Psychiatric: Anxious, positive auditory hallucinations, positive visual hallucinations, examining some paranoia as well  ____________________________________________   LABS (all labs ordered are listed, but only abnormal results are displayed)  Labs Reviewed  COMPREHENSIVE METABOLIC PANEL - Abnormal; Notable for the following components:      Result Value   Glucose, Bld 160 (*)  Calcium 10.5 (*)    Albumin 5.1 (*)    All other components within normal limits  CBC   ____________________________________________  EKG  None ____________________________________________  RADIOLOGY  None ____________________________________________   PROCEDURES  Procedure(s) performed: No  Procedures   Critical Care performed: No ____________________________________________   INITIAL IMPRESSION / ASSESSMENT AND PLAN / ED COURSE  Pertinent labs & imaging results that were available during my care of the patient were reviewed by me and considered in my medical decision making (see chart for details).  Patient with history of schizophrenia, likely having exacerbation.  We will place the patient on commitment as I do not believe he is safe to be on his own and consult TTS and psychiatry    ____________________________________________   FINAL CLINICAL IMPRESSION(S) / ED DIAGNOSES  Final diagnoses:  Schizophrenia, unspecified type (HCC)        Note:  This document was prepared  using Dragon voice recognition software and may include unintentional dictation errors.    Jene EveryKinner, Zabrina Brotherton, MD 09/20/17 1430

## 2017-09-20 NOTE — ED Triage Notes (Signed)
[place] c/o hallucinations, paranoia. Pt states that the voives are telling him to do things to people and himself that he should not do. Pt admits that the voices are telling him to hurt other people and himself.

## 2017-09-20 NOTE — ED Notes (Signed)
Patient transferred to room 3, He is alert and oriented, states that he is depressed, and having si thoughts without a plan, states that he hears negative voices, he is cooperative, no behavioral issues noted, nurse will continue to monitor, gave him a extra tray, and blanket, told him if  He had any concerns or questions to ask nurse, He agreed, q 15 minute checks and camera surveillance in progress for safety.

## 2017-09-20 NOTE — ED Notes (Signed)
Sitting in triage rm 1 with pt and officer until rm in back is ready.

## 2017-09-20 NOTE — BH Assessment (Signed)
[facility] by Dr. Toni Amendlapacs.  Attending Physician will be Dr. Jennet MaduroPucilowska.   Patient has been assigned to room 306[facility]A, [facility] Charge Nurse [place].   Intake Paper Work has been signed and placed on patient chart.  ER staff is aware of the admission:  [facility]   Dr. Lamont Snowballifenbark, ER MD   Toniann FailWendy Patient's Nurse   Ms.Gwen Patient Access.

## 2017-09-20 NOTE — ED Notes (Signed)

## 2017-09-20 NOTE — ED Notes (Signed)
PT  PUT UNDER  IVC  PER  DR Cyril LoosenKINNER  INFORMED  RN KIM  / PENDING  CONSULT

## 2017-09-20 NOTE — ED Notes (Signed)
BEHAVIORAL HEALTH ROUNDING Patient sleeping: No. Patient alert and oriented: yes Behavior appropriate: Yes.  ; If no, describe:  Nutrition and fluids offered: Yes  Toileting and hygiene offered: Yes  Sitter present: not applicable Law enforcement present: Yes  

## 2017-09-20 NOTE — ED Notes (Signed)
[place] Patient currently being see by Dr. Toni Amendlapacs. [facility] after interview if agreeable to MDs.

## 2017-09-20 NOTE — Progress Notes (Signed)
ANTICOAGULATION CONSULT NOTE - Initial Consult  Pharmacy Consult for Warfarin Indication: VTE prophylaxis  Allergies  Allergen Reactions  . Penicillins Other (See Comments)    Reaction: "lockjaw" Has patient had a PCN reaction causing immediate rash, facial/tongue/throat swelling, SOB or lightheadedness with hypotension: No Has patient had a PCN reaction causing severe rash involving mucus membranes or skin necrosis: No Has patient had a PCN reaction that required hospitalization: No Has patient had a PCN reaction occurring within the last 10 years: No If all of the above answers are "NO", then may proceed with Cephalosporin use.     Patient Measurements: Height: 5\' 10"  (177.8 cm) Weight: 191 lb (86.6 kg) IBW/kg (Calculated) : 73  Vital Signs: Temp: 98.3 F (36.8 C) (04/08 1752) Temp Source: Oral (04/08 1752) BP: 136/78 (04/08 1752) Pulse Rate: 92 (04/08 1752)  Labs: Recent Labs    09/20/17 1316 09/20/17 1915  HGB 16.5 15.5  HCT 47.8 45.1  PLT 239 206  LABPROT  --  17.3*  INR  --  1.43  CREATININE 1.15  --     Estimated Creatinine Clearance: 85.5 mL/min (by C-G formula based on SCr of 1.15 mg/dL).   Medical History: Past Medical History:  Diagnosis Date  . Depression   . Diabetes mellitus without complication (HCC)   . Hyperlipidemia   . Hypertension   . Lupus anticoagulant disorder (HCC) 06/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  . PE (pulmonary thromboembolism) (HCC)   . Schizo affective schizophrenia (HCC)   . Supratherapeutic INR 11/19/2016    Medications:  Scheduled:  . [START ON 09/21/2017] buPROPion  150 mg Oral Daily  . cloZAPine  500 mg Oral QHS  . docusate sodium  200 mg Oral BID  . [START ON 09/21/2017] insulin detemir  14 Units Subcutaneous Daily  . ipratropium  1 spray Each Nare QHS  . [START ON 09/21/2017] lisinopril  2.5 mg Oral Daily  . lithium carbonate  450 mg Oral Q12H  . [START ON 09/21/2017] metFORMIN  500 mg Oral BID WC  .  mometasone-formoterol  2 puff Inhalation BID  . [START ON 09/21/2017] nicotine  21 mg Transdermal Q0600  . [START ON 09/21/2017] polyethylene glycol  17 g Oral Daily  . [START ON 09/21/2017] simvastatin  40 mg Oral q1800  . traZODone  100 mg Oral QHS  . warfarin  10 mg Oral q1800  . [START ON 09/21/2017] Warfarin - Pharmacist Dosing Inpatient   Does not apply q1800    Assessment: Warfarin ordered as per home dose   4/8  INR = 1.43   Warfarin 10 mg  Goal of Therapy:  INR 2-3 Monitor platelets by anticoagulation protocol: Yes   Plan:  Give warfarin 10mg  tonight. Check INR with AM labs.   Stormy CardKatsoudas,Luree Palla K, [facility]PH 09/20/2017,8:03 PM

## 2017-09-20 NOTE — BH Assessment (Signed)
Assessment Note  Lance [name] is an 42 y.o. male who presents to the ED via [place] [employer] with c/o auditory and visual hallucinations. Pt reports that he just recently moved to [facility] and since has had racing SI/HI thoughts. He states, "I just got to the point where I couldn't take it no more and I just wanted to come here cause I didn't know where to turn to. I've been having a lot of delusions too like having a lot of money and living a better life. You know like dreams that won't ever come true."  Pt reports having reoccurring thoughts of SI and HI as well as having V/A hallucinations. "I always see people that aren't really there and flashing lights and I hear voices telling me to do things that I shouldn't do. That I don't want to do."  During the assessment, he was calm, cooperative, and answered questing appropriately. He was forthcoming with information and seemed to understand the circumstances of his situation. Pt was oriented x3. Pt was in scrubs but had a foul body odor that could be smelled from outside of the room. Pt has been recommended for inpatient care by Dr. Toni Amend and has been assigned a bed on the behavioral medicine unit.   Diagnosis: Schizophrenia  Past Medical History:  Past Medical History:  Diagnosis Date  . Depression   . Diabetes mellitus without complication (HCC)   . Hyperlipidemia   . Hypertension   . Lupus anticoagulant disorder (HCC) 06/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  . PE (pulmonary thromboembolism) (HCC)   . Schizo affective schizophrenia (HCC)   . Supratherapeutic INR 11/19/2016    History reviewed. No pertinent surgical history.  Family History:  Family History  Problem Relation Age of Onset  . CAD Mother   . CAD Sister     Social History:  reports that he has been smoking cigarettes.  He has a 23.00 pack-year smoking history. He has never used smokeless tobacco. He reports that he has current or past drug history.  Drug: Marijuana. He reports that he does not drink alcohol.  Additional Social History:  Alcohol / Drug Use Pain Medications: See MAR Prescriptions: See MAR Over the Counter: See MAR History of alcohol / drug use?: No history of alcohol / drug abuse  CIWA: CIWA-Ar BP: 101/71 Pulse Rate: 90 COWS:    Allergies:  Allergies  Allergen Reactions  . Penicillins Other (See Comments)    Reaction: "lockjaw" Has patient had a PCN reaction causing immediate rash, facial/tongue/throat swelling, SOB or lightheadedness with hypotension: No Has patient had a PCN reaction causing severe rash involving mucus membranes or skin necrosis: No Has patient had a PCN reaction that required hospitalization: No Has patient had a PCN reaction occurring within the last 10 years: No If all of the above answers are "NO", then may proceed with Cephalosporin use.     Home Medications:  (Not in a hospital admission)  OB/GYN Status:  No LMP for male patient.  General Assessment Data Location of Assessment: [facility] ED TTS Assessment: In system Is this a Tele or Face-to-Face Assessment?: Face-to-Face Is this an Initial Assessment or a Re-assessment for this encounter?: Initial Assessment Marital status: Single Is patient pregnant?: No Pregnancy Status: No Living Arrangements: Group Home Can pt return to current living arrangement?: Yes Admission Status: Involuntary Is patient capable of signing voluntary admission?: No Referral Source: Self/Family/Friend Insurance type: Medicare  Medical Screening Exam [facility] Walk[facility]in ONLY)  Medical Exam completed: Yes  Crisis Care Plan Living Arrangements: Group Home Legal Guardian: Other:  Education Status Is patient currently in school?: No Is the patient employed, unemployed or receiving disability?: Receiving disability income  Risk to self with the past 6 months Suicidal Ideation: No-Not Currently/Within Last 6 Months Has patient been a risk to self within the  past 6 months prior to admission? : No Suicidal Intent: No-Not Currently/Within Last 6 Months Has patient had any suicidal intent within the past 6 months prior to admission? : No Is patient at risk for suicide?: No Suicidal Plan?: No-Not Currently/Within Last 6 Months Has patient had any suicidal plan within the past 6 months prior to admission? : No Access to Means: No What has been your use of drugs/alcohol within the last 12 months?: PT DENIES Previous Attempts/Gestures: Yes How many times?: 2 Other Self Harm Risks: N/A Triggers for Past Attempts: Hallucinations Intentional Self Injurious Behavior: None Family Suicide History: Unknown Recent stressful life event(s): Turmoil (Comment) Persecutory voices/beliefs?: Yes Depression: No Substance abuse history and/or treatment for substance abuse?: No Suicide prevention information given to non-admitted patients: Not applicable  Risk to Others within the past 6 months Homicidal Ideation: No-Not Currently/Within Last 6 Months Does patient have any lifetime risk of violence toward others beyond the six months prior to admission? : No Thoughts of Harm to Others: No-Not Currently Present/Within Last 6 Months Current Homicidal Intent: No-Not Currently/Within Last 6 Months Current Homicidal Plan: No-Not Currently/Within Last 6 Months Access to Homicidal Means: No Identified Victim: N/A History of harm to others?: No Assessment of Violence: None Noted Violent Behavior Description: Pt denies violent behaviors Does patient have access to weapons?: No Criminal Charges Pending?: No Does patient have a court date: No Is patient on probation?: No  Psychosis Hallucinations: Auditory, Visual, With command Delusions: Grandiose  Mental Status Report Appearance/Hygiene: Body odor, Disheveled Eye Contact: Fair Motor Activity: Freedom of movement Speech: Logical/coherent, Slow Level of Consciousness: Alert Mood: Empty Affect: Flat Anxiety  Level: Minimal Thought Processes: Irrelevant, Thought Blocking Judgement: Partial Orientation: Person, Place, Time, Situation Obsessive Compulsive Thoughts/Behaviors: Moderate  Cognitive Functioning Concentration: Decreased Memory: Unable to Assess Is patient IDD: Yes Is patient DD?: Yes I IQ score available?: No Insight: Poor Impulse Control: Poor Appetite: Good Have you had any weight changes? : Loss Amount of the weight change? (lbs): 10 lbs Sleep: No Change Total Hours of Sleep: 8 Vegetative Symptoms: Not bathing  ADLScreening [facility] Services) Patient's cognitive ability adequate to safely complete daily activities?: Yes Patient able to express need for assistance with ADLs?: Yes Independently performs ADLs?: Yes (appropriate for developmental age)  Prior Inpatient Therapy Prior Inpatient Therapy: Yes Prior Therapy Dates: 03/17/2017 Prior Therapy Facilty/Provider(s): [facility] Reason for Treatment: Schizophrenia  Prior Outpatient Therapy Prior Outpatient Therapy: No Does patient have an ACCT team?: No Does patient have Intensive In-House Services?  : No Does patient have [facility] services? : No Does patient have P4CC services?: No  ADL Screening (condition at time of admission) Patient's cognitive ability adequate to safely complete daily activities?: Yes Is the patient deaf or have difficulty hearing?: No Does the patient have difficulty seeing, even when wearing glasses/contacts?: No Does the patient have difficulty concentrating, remembering, or making decisions?: No Patient able to express need for assistance with ADLs?: Yes Does the patient have difficulty dressing or bathing?: No Independently performs ADLs?: Yes (appropriate for developmental age) Does the patient have difficulty walking or climbing stairs?: No Weakness of Legs: None  Weakness of Arms/Hands: None  Home Assistive Devices/Equipment Home Assistive Devices/Equipment:  Nebulizer  Therapy Consults (therapy consults require a physician order) PT Evaluation Needed: No OT Evalulation Needed: No SLP Evaluation Needed: No Abuse/Neglect Assessment (Assessment to be complete while patient is alone) Abuse/Neglect Assessment Can Be Completed: Yes Physical Abuse: Denies Verbal Abuse: Denies Sexual Abuse: Denies Exploitation of patient/patient's resources: Denies Self-Neglect: Denies Values / Beliefs Cultural Requests During Hospitalization: None Spiritual Requests During Hospitalization: None Consults Spiritual Care Consult Needed: No Social Work Consult Needed: No      Additional Information 1:1 In Past 12 Months?: No CIRT Risk: No Elopement Risk: No Does patient have medical clearance?: Yes  Child/Adolescent Assessment Running Away Risk: Denies Bed-Wetting: Denies Destruction of Property: Denies Cruelty to Animals: Denies Stealing: Denies Rebellious/Defies Authority: Denies Dispensing opticianatanic Involvement: Denies Archivistire Setting: Denies Problems at [employer]: Denies Gang Involvement: Denies  Disposition:  Disposition Initial Assessment Completed for this Encounter: Yes Disposition of Patient: Admit Type of inpatient treatment program: Adult Patient refused recommended treatment: No Mode of transportation if patient is discharged?: Car  On Site Evaluation by:   Reviewed with Physician:    Lance [name] 09/20/2017 5:27 PM

## 2017-09-20 NOTE — Progress Notes (Signed)
Patient ID: [name], male   DOB: 06-Jan-1976, 42 y.o.   MRN: [id number] PER STATE REGULATIONS 482.30  THIS CHART WAS REVIEWED FOR MEDICAL NECESSITY WITH RESPECT TO THE PATIENT'S ADMISSION/DURATION OF STAY.  NEXT REVIEW DATE:09/24/17  Loura HaltBARBARA Lotus Gover, RN, BSN CASE MANAGER

## 2017-09-20 NOTE — ED Notes (Signed)
IVC/ Consult completed/ Plan to admit when medically clear  

## 2017-09-20 NOTE — Progress Notes (Signed)
Patient pleasant and cooperative during admission assessment. Patient denies SI/HI at this time. Patient denies AVH. Patient informed of fall risk status, fall risk assessed "low" at this time. Patient oriented to unit/staff/room. Patient denies any questions/concerns at this time. Patient safe on unit with Q15 minute checks for safety. Skin assessment and body search done,no contraband found. 

## 2017-09-20 NOTE — ED Notes (Signed)
Patient is going to be [facility] to [facility] [address], [facility], patient's belongings taking with him, and He went with tech. And police officer in w/c.

## 2017-09-21 ENCOUNTER — Encounter: Payer: Self-pay | Admitting: Psychiatry

## 2017-09-21 DIAGNOSIS — F203 Undifferentiated schizophrenia: Principal | ICD-10-CM

## 2017-09-21 LAB — PROTIME-INR
INR: 1.42
Prothrombin Time: 17.2 seconds — ABNORMAL HIGH (ref 11.4–15.2)

## 2017-09-21 LAB — GLUCOSE, CAPILLARY
GLUCOSE-CAPILLARY: 100 mg/dL — AB (ref 65–99)
Glucose-Capillary: 134 mg/dL — ABNORMAL HIGH (ref 65–99)
Glucose-Capillary: 114 mg/dL — ABNORMAL HIGH (ref 65–99)
Glucose-Capillary: 132 mg/dL — ABNORMAL HIGH (ref 65–99)

## 2017-09-21 LAB — ETHANOL: Alcohol, Ethyl (B): <10 mg/dL (ref <10)

## 2017-09-21 LAB — HEMOGLOBIN A1C
Hgb A1c MFr Bld: 4.7 % — ABNORMAL LOW (ref 4.8–5.6)
MEAN PLASMA GLUCOSE: 88.19 mg/dL

## 2017-09-21 LAB — LITHIUM LEVEL: LITHIUM LVL: 0.84 mmol/L (ref 0.60–1.20)

## 2017-09-21 LAB — TSH: TSH: 3.91 u[IU]/mL (ref 0.350–4.500)

## 2017-09-21 NOTE — B[facility] Suicide Risk Assessment (Signed)
[facility] INPATIENT:  Family/Significant Other Suicide Prevention Education  Suicide Prevention Education:  Education Completed;  Pt's guardian, Lance [name], at  [phone] has been identified by the patient as the family member/significant other with whom the patient will be residing, and identified as the person(s) who will aid the patient in the event of a mental health crisis (suicidal ideations/suicide attempt).  With written consent from the patient, the family member/significant other has been provided the following suicide prevention education, prior to the and/or following the discharge of the patient.  The suicide prevention education provided includes the following:  Suicide risk factors  Suicide prevention and interventions  National Suicide Hotline telephone number  [facility] assessment telephone number  [facility] Emergency Assistance 911  [facility] and/or Residential Mobile Crisis Unit telephone number  Request made of family/significant other to:  Remove weapons (e.g., guns, rifles, knives), all items previously/currently identified as safety concern.    Remove drugs/medications (over-the-counter, prescriptions, illicit drugs), all items previously/currently identified as a safety concern.  The family member/significant other verbalizes understanding of the suicide prevention education information provided.  The family member/significant other agrees to remove the items of safety concern listed above.   Pt's guardian added, "I know he got upset with the group home because they said he needed to start paying his copays and haircuts out of his 66 dollars a month. That's when he started getting upset and all of this happened. Usually, he'll walk to the police station and say he needs a ride to the hospital. And, usually he says he wants to discharge to another group home, but I'm not up for that this time. He's living in a good place, and he needs to  go back there when he's discharged. He usually does this, and then he'll stay a few days and be ready to be discharged." Pt's guardian confirmed [facility], and will continue receiving services upon discharge. CSW will continue to coordinate with pt's guardian as needed for updates/discharge planning.   CSW will fax consents to pt's guardian, and have her fax them back along with guardianship paperwork.   Heidi DachKelsey Devean Skoczylas, LCSW 09/21/2017, 11:23 AM
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
## 2017-09-21 NOTE — Progress Notes (Signed)
ANTICOAGULATION CONSULT NOTE   Pharmacy Consult for Warfarin Indication: VTE prophylaxis  Allergies  Allergen Reactions  . Penicillins Other (See Comments)    Reaction: "lockjaw" Has patient had a PCN reaction causing immediate rash, facial/tongue/throat swelling, SOB or lightheadedness with hypotension: No Has patient had a PCN reaction causing severe rash involving mucus membranes or skin necrosis: No Has patient had a PCN reaction that required hospitalization: No Has patient had a PCN reaction occurring within the last 10 years: No If all of the above answers are "NO", then may proceed with Cephalosporin use.     Patient Measurements: Height: 5\' 10"  (177.8 cm) Weight: 191 lb (86.6 kg) IBW/kg (Calculated) : 73  Vital Signs: Temp: 97.6 F (36.4 C) (04/09 0615) Temp Source: Oral (04/09 0615) BP: 112/82 (04/09 0616) Pulse Rate: 76 (04/09 0616)  Labs: Recent Labs    09/20/17 1316 09/20/17 1915 09/21/17 0747  HGB 16.5 15.5  --   HCT 47.8 45.1  --   PLT 239 206  --   LABPROT  --  17.3* 17.2*  INR  --  1.43 1.42  CREATININE 1.15  --   --     Estimated Creatinine Clearance: 85.5 mL/min (by C-G formula based on SCr of 1.15 mg/dL).   Medical History: Past Medical History:  Diagnosis Date  . Depression   . Diabetes mellitus without complication (HCC)   . Hyperlipidemia   . Hypertension   . Lupus anticoagulant disorder (HCC) 06/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  . PE (pulmonary thromboembolism) (HCC)   . Schizo affective schizophrenia (HCC)   . Supratherapeutic INR 11/19/2016    Medications:  Scheduled:  . buPROPion  150 mg Oral Daily  . cloZAPine  500 mg Oral QHS  . docusate sodium  200 mg Oral BID  . insulin aspart  0-15 Units Subcutaneous TID WC  . insulin aspart  0-5 Units Subcutaneous QHS  . insulin detemir  14 Units Subcutaneous Daily  . ipratropium  1 spray Each Nare QHS  . lisinopril  2.5 mg Oral Daily  . lithium carbonate  450 mg Oral  Q12H  . metFORMIN  500 mg Oral BID WC  . mometasone-formoterol  2 puff Inhalation BID  . nicotine  21 mg Transdermal Q0600  . polyethylene glycol  17 g Oral Daily  . simvastatin  40 mg Oral q1800  . traZODone  100 mg Oral QHS  . warfarin  10 mg Oral q1800  . Warfarin - Pharmacist Dosing Inpatient   Does not apply q1800    Assessment: Warfarin ordered as per home dose   4/8  INR = 1.43   Warfarin 10 mg 49   INR = 1.42  Goal of Therapy:  INR 2-3 Monitor platelets by anticoagulation protocol: Yes   Plan:  Give warfarin 10mg  tonight. Check INR with AM labs.   Kimala Horne K, RPH 09/21/2017,10:00 AM

## 2017-09-21 NOTE — Progress Notes (Signed)
Recreation Therapy Notes  INPATIENT RECREATION THERAPY ASSESSMENT  Patient Details Name: Lance [name] MRN: [id number] DOB: 1976-04-07 Today's Date: 09/21/2017   Patient states he does not feel like talking.       Information Obtained From:    Able to Participate in Assessment/Interview:    Patient Presentation:    Reason for Admission (Per Patient):    Patient Stressors:    Coping Skills:      Leisure Interests (2+):     Frequency of Recreation/Participation:    Awareness of Community Resources:     [facility] Resources:     Current Use:    If no, Barriers?:    Expressed Interest in [facility] [employer] CorporationCommunity Resource Information:    [place]     Patient Main Form of Transportation:    Patient Strengths:     Patient Identified Areas of Improvement:     Patient Goal for Hospitalization:     Current SI (including self-harm):     Current HI:     Current AVH:    Staff Intervention Plan:    Consent to Intern Participation:    Kambrie Eddleman 09/21/2017, 11:45 AM

## 2017-09-21 NOTE — [facility] Suicide Risk Assessment (Signed)
[facility] INPATIENT:  Family/Significant Other Suicide Prevention Education  Suicide Prevention Education:  Contact Attempts: Pt's guardian, Victory DakinVivian Harris, at  [phone] has been identified by the patient as the family member/significant other with whom the patient will be residing, and identified as the person(s) who will aid the patient in the event of a mental health crisis.  With written consent from the patient, two attempts were made to provide suicide prevention education, prior to and/or following the patient's discharge.  We were unsuccessful in providing suicide prevention education.  A suicide education pamphlet was given to the patient to share with family/significant other.  Date and time of first attempt: 09/21/17 at 10:15AM  Date and time of second attempt:   Heidi DachKelsey Teresina Bugaj, Alexander MtLCSW 09/21/2017, 10:24 AM

## 2017-09-21 NOTE — H&P (Addendum)
Psychiatric Admission Assessment Adult  Patient Identification: Lance [name] MRN:  [id number] Date of Evaluation:  09/21/2017 Chief Complaint:  SCHIZOPHRENIA Principal Diagnosis: Schizophrenia, undifferentiated (HCC) Diagnosis:   Patient Active Problem List   Diagnosis Date Noted  . Schizophrenia, undifferentiated (HCC) [F20.3] 09/20/2017    Priority: High  . Tobacco use disorder [F17.200] 09/01/2016  . Schizoaffective disorder, bipolar type (HCC) [F25.0] 05/22/2016  . Diabetes mellitus without complication (HCC) [E11.9] 05/21/2016  . Hyperlipidemia [E78.5] 05/21/2016  . History of pulmonary embolism [Z86.711] 05/21/2016  . Chronic anticoagulation [Z79.01] 05/21/2016  . Hypertension [I10] 09/25/2015   History of Present Illness:   Identifying data. Lance [name] is a 42 year old male with a history of schizophrenia.  Chief complaint. "whatever I hear and see"  History of present illness. Information was obtained from the patient and the chart. The patient came to the ER complaining of auditory andf visual hallucinations and paranoia. He has been frightened and thinking about suicide. This has been going on since he moved to a new group home several months ago but has gotten worse. There were no recent medication adjustments and he usually has good compliance. He smokes cannabis but there is no drinking or other substance use. He is rather bitter about his group home. "They sleep all day long, there is nothing to do, they take all my money". The patient has been in multiple group homes over the years, usually well adjusted. I have known him for years and this is the first time he complains about money. In addition to psychotic symptoms, he reports decreased appetite, anhedonia, feeling hopeless and worthless, poor energy and concentration, social isolation and crying spells. He does not complains about anxiety att all today.  Past psychiatric history. Long history of schizophrenia with  multiple hospitalizations and medication trials. He is in the care of [facility].  Family psychiatric history. Mother with mental illness. She passed away recently.   Social history. He is an incompetent adult and [facility] his [place], Victory DakinVivian Harris  Total Time spent with patient: 1 hour  Is the patient at risk to self? Yes.    Has the patient been a risk to self in the past 6 months? No.  Has the patient been a risk to self within the distant past? Yes.    Is the patient a risk to others? No.  Has the patient been a risk to others in the past 6 months? No.  Has the patient been a risk to others within the distant past? No.   Prior Inpatient Therapy:   Prior Outpatient Therapy:    Alcohol Screening: 1. How often do you have a drink containing alcohol?: Never 2. How many drinks containing alcohol do you have on a typical day when you are drinking?: 1 or 2 3. How often do you have six or more drinks on one occasion?: Never AUDIT-C Score: 0 4. How often during the last year have you found that you were not able to stop drinking once you had started?: Never 5. How often during the last year have you failed to do what was normally expected from you becasue of drinking?: Never 6. How often during the last year have you needed a first drink in the morning to get yourself going after a heavy drinking session?: Never 7. How often during the last year have you had a feeling of guilt of remorse after drinking?: Never 8. How often during the last year have you been  unable to remember what happened the night before because you had been drinking?: Never 9. Have you or someone else been injured as a result of your drinking?: No 10. Has a relative or friend or a doctor or another health worker been concerned about your drinking or suggested you cut down?: No Alcohol Use Disorder Identification Test Final Score (AUDIT): 0 Intervention/Follow-up: AUDIT Score <7 follow-up not  indicated Substance Abuse History in the last 12 months:  Yes.   Consequences of Substance Abuse: Negative Previous Psychotropic Medications: Yes  Psychological Evaluations: No  Past Medical History:  Past Medical History:  Diagnosis Date  . Depression   . Diabetes mellitus without complication (HCC)   . Hyperlipidemia   . Hypertension   . Lupus anticoagulant disorder (HCC) 06/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  . PE (pulmonary thromboembolism) (HCC)   . Schizo affective schizophrenia (HCC)   . Supratherapeutic INR 11/19/2016   History reviewed. No pertinent surgical history. Family History:  Family History  Problem Relation Age of Onset  . CAD Mother   . CAD Sister     Tobacco Screening: Have you used any form of tobacco in the last 30 days? (Cigarettes, Smokeless Tobacco, Cigars, and/or Pipes): Yes Tobacco use, Select all that apply: 5 or more cigarettes per day Are you interested in Tobacco Cessation Medications?: Yes, will notify MD for an order Counseled patient on smoking cessation including recognizing danger situations, developing coping skills and basic information about quitting provided: Yes Social History:  Social History   Substance and Sexual Activity  Alcohol Use No   Comment: occassionally     Social History   Substance and Sexual Activity  Drug Use Yes  . Types: Marijuana   Comment: Last use 11/29/16    Additional Social History:                           Allergies:   Allergies  Allergen Reactions  . Penicillins Other (See Comments)    Reaction: "lockjaw" Has patient had a PCN reaction causing immediate rash, facial/tongue/throat swelling, SOB or lightheadedness with hypotension: No Has patient had a PCN reaction causing severe rash involving mucus membranes or skin necrosis: No Has patient had a PCN reaction that required hospitalization: No Has patient had a PCN reaction occurring within the last 10 years: No If all of the  above answers are "NO", then may proceed with Cephalosporin use.    Lab Results:  Results for orders placed or performed during the hospital encounter of 09/20/17 (from the past 48 hour(s))  CBC with Differential/Platelet     Status: None   Collection Time: 09/20/17  7:15 PM  Result Value Ref Range   WBC 6.0 3.8 - 10.6 K/uL   RBC 4.90 4.40 - 5.90 MIL/uL   Hemoglobin 15.5 13.0 - 18.0 g/dL   HCT 16.1 09.6 - 04.5 %   MCV 92.1 80.0 - 100.0 fL   MCH 31.7 26.0 - 34.0 pg   MCHC 34.4 32.0 - 36.0 g/dL   RDW 40.9 81.1 - 91.4 %   Platelets 206 150 - 440 K/uL   Neutrophils Relative % 63 %   Neutro Abs 3.8 1.4 - 6.5 K/uL   Lymphocytes Relative 24 %   Lymphs Abs 1.4 1.0 - 3.6 K/uL   Monocytes Relative 13 %   Monocytes Absolute 0.8 0.2 - 1.0 K/uL   Eosinophils Relative 0 %   Eosinophils Absolute 0.0  0 - 0.7 K/uL   Basophils Relative 0 %   Basophils Absolute 0.0 0 - 0.1 K/uL    Comment: Performed at [facility], [address] [place], [place] [address]  Protime-INR     Status: Abnormal   Collection Time: 09/20/17  7:15 PM  Result Value Ref Range   Prothrombin Time 17.3 (H) 11.4 - 15.2 seconds   INR 1.43     Comment: Performed at [facility], [address] [place], [place] [address]  Lithium level     Status: None   Collection Time: 09/20/17  7:15 PM  Result Value Ref Range   Lithium Lvl 0.84 0.60 - 1.20 mmol/L    Comment: Performed at [facility], [address] [place], [place] [address]  Glucose, capillary     Status: Abnormal   Collection Time: 09/21/17  7:06 AM  Result Value Ref Range   Glucose-Capillary 100 (H) 65 - 99 mg/dL  Ethanol     Status: None   Collection Time: 09/21/17  7:47 AM  Result Value Ref Range   Alcohol, Ethyl (B) <10 <10 mg/dL    Comment:        LOWEST DETECTABLE LIMIT FOR SERUM ALCOHOL IS 10 mg/dL FOR MEDICAL PURPOSES ONLY Performed at [facility], [address] [place], [place] [address]   TSH     Status:  None   Collection Time: 09/21/17  7:47 AM  Result Value Ref Range   TSH 3.910 0.350 - 4.500 uIU/mL    Comment: Performed by a 3rd Generation assay with a functional sensitivity of <=0.01 uIU/mL. Performed at [facility], [address] [place], [place] [address]   Protime-INR     Status: Abnormal   Collection Time: 09/21/17  7:47 AM  Result Value Ref Range   Prothrombin Time 17.2 (H) 11.4 - 15.2 seconds   INR 1.42     Comment: Performed at [facility], [address] [place], [place] [address]    Blood Alcohol level:  Lab Results  Component Value Date   [facility] <10 09/21/2017   ETH <10 03/16/2017    Metabolic Disorder Labs:  Lab Results  Component Value Date   HGBA1C 5.3 03/03/2017   MPG 105.41 03/03/2017   MPG 111 12/30/2016   Lab Results  Component Value Date   PROLACTIN 2.0 (L) 08/31/2016   PROLACTIN 4.8 05/22/2016   Lab Results  Component Value Date   CHOL 131 03/03/2017   TRIG 207 (H) 03/03/2017   HDL 31 (L) 03/03/2017   CHOLHDL 4.2 03/03/2017   VLDL 41 (H) 03/03/2017   LDLCALC 59 03/03/2017   LDLCALC 59 12/30/2016    Current Medications: Current Facility-Administered Medications  Medication Dose Route Frequency Provider Last Rate Last Dose  . acetaminophen (TYLENOL) tablet 650 mg  650 mg Oral Q6H PRN Clapacs, [name], MD      . albuterol (PROVENTIL HFA;VENTOLIN HFA) 108 (90 Base) MCG/ACT inhaler 2 puff  2 puff Inhalation Q6H PRN Clapacs, [name], MD      . alum & mag hydroxide-simeth (MAALOX/MYLANTA) 200-200-20 MG/5ML suspension 30 mL  30 mL Oral Q4H PRN Clapacs, [name], MD      . buPROPion (WELLBUTRIN XL) 24 hr tablet 150 mg  150 mg Oral Daily Clapacs, [name], MD   150 mg at 09/21/17 0805  . cloZAPine (CLOZARIL) tablet 500 mg  500 mg Oral QHS Clapacs, [name], MD   500 mg at 09/20/17 2142  .  docusate sodium (COLACE) capsule 200 mg  200 mg Oral BID Clapacs, [name], MD   200 mg at 09/21/17 0805  . insulin aspart (novoLOG) injection 0-15  Units  0-15 Units Subcutaneous TID WC Saber Dickerman B, MD      . insulin aspart (novoLOG) injection 0-5 Units  0-5 Units Subcutaneous QHS Kaelin Holford B, MD      . insulin detemir (LEVEMIR) injection 14 Units  14 Units Subcutaneous Daily Clapacs, [name], MD   14 Units at 09/21/17 0806  . ipratropium (ATROVENT) 0.06 % nasal spray 1 spray  1 spray Each Nare QHS Clapacs, [name], MD   1 spray at 09/20/17 2141  . lisinopril (PRINIVIL,ZESTRIL) tablet 2.5 mg  2.5 mg Oral Daily Clapacs, [name], MD   2.5 mg at 09/21/17 0808  . lithium carbonate (ESKALITH) CR tablet 450 mg  450 mg Oral Q12H Clapacs, [name], MD   450 mg at 09/21/17 0805  . magnesium hydroxide (MILK OF MAGNESIA) suspension 30 mL  30 mL Oral Daily PRN Clapacs, [name], MD      . metFORMIN (GLUCOPHAGE) tablet 500 mg  500 mg Oral BID WC Clapacs, [name], MD   500 mg at 09/21/17 0805  . mometasone-formoterol (DULERA) 200-5 MCG/ACT inhaler 2 puff  2 puff Inhalation BID Clapacs, [name], MD   2 puff at 09/21/17 [phone]  . nicotine (NICODERM CQ - dosed in mg/24 hours) patch 21 mg  21 mg Transdermal Q0600 Clapacs, [name], MD      . polyethylene glycol (MIRALAX / GLYCOLAX) packet 17 g  17 g Oral Daily Clapacs, [name], MD   17 g at 09/21/17 0806  . simvastatin (ZOCOR) tablet 40 mg  40 mg Oral q1800 Clapacs, [name], MD      . traZODone (DESYREL) tablet 100 mg  100 mg Oral QHS Clapacs, [name], MD   100 mg at 09/20/17 2143  . warfarin (COUMADIN) tablet 10 mg  10 mg Oral q1800 Clapacs, [name], MD   10 mg at 09/20/17 2150  . Warfarin - Pharmacist Dosing Inpatient   Does not apply q1800 Clapacs, [name], MD       PTA Medications: Medications Prior to Admission  Medication Sig Dispense Refill Last Dose  . albuterol (PROVENTIL HFA;VENTOLIN HFA) 108 (90 Base) MCG/ACT inhaler Inhale 1-2 puffs into the lungs every 4 (four) hours as needed for wheezing or shortness of breath. 1 Inhaler 0 prn at prn  . buPROPion (WELLBUTRIN XL) 150 MG 24 hr tablet Take 1  tablet (150 mg total) by mouth daily. 30 tablet 0 unknown at unknown  . clindamycin (CLEOCIN) 150 MG capsule Take 2 capsules every 6 hours for infection 56 capsule 0   . cloZAPine (CLOZARIL) 100 MG tablet Take 5 tablets (500 mg total) by mouth at bedtime. 150 tablet 0 unknown at unknown  . docusate sodium (COLACE) 100 MG capsule Take 2 capsules (200 mg total) by mouth 2 (two) times daily. 120 capsule 0 unknown at unknown  . Fluticasone-Salmeterol (ADVAIR) 250-50 MCG/DOSE AEPB Inhale 1 puff into the lungs 2 (two) times daily.   unknown at unknown  . HYDROcodone-acetaminophen (NORCO/VICODIN) 5-325 MG tablet Take 1 tablet by mouth every 6 (six) hours as needed for moderate pain. 8 tablet 0   . insulin detemir (LEVEMIR) 100 UNIT/ML injection Inject 14 Units into the skin every morning.   unknown at unknown  . ipratropium (ATROVENT) 0.06 % nasal spray Place 1 spray  into both nostrils at bedtime. 3 mL 0 unknown at unknown  . lisinopril (PRINIVIL,ZESTRIL) 2.5 MG tablet Take 2.5 mg by mouth daily.   unknown at unknown  . lithium carbonate (ESKALITH) 450 MG CR tablet Take 1 tablet (450 mg total) by mouth every 12 (twelve) hours. 60 tablet 0 unknown at unknown  . metformin (FORTAMET) 500 MG (OSM) 24 hr tablet Take 500 mg by mouth 2 (two) times daily with a meal.   unknown at unknown  . mometasone-formoterol (DULERA) 200-5 MCG/ACT AERO Inhale 2 puffs into the lungs 2 (two) times daily. 1 Inhaler 1 unknown at unknown  . omega-3 acid ethyl esters (LOVAZA) 1 g capsule Take 1 g by mouth daily.   unknown at unknown  . ondansetron (ZOFRAN ODT) 4 MG disintegrating tablet Take 1 tablet (4 mg total) by mouth every 8 (eight) hours as needed for nausea or vomiting. 20 tablet 0   . polyethylene glycol (MIRALAX / GLYCOLAX) packet Take 17 g by mouth daily. 30 each 0 prn at prn  . predniSONE (DELTASONE) 50 MG tablet Take one 50 mg tablet daily for the next five days. 5 tablet 0   . simvastatin (ZOCOR) 40 MG tablet Take 40 mg  by mouth at bedtime.   unknown at unknown  . traZODone (DESYREL) 50 MG tablet Take 75 mg by mouth at bedtime.   unknown at unknown  . warfarin (COUMADIN) 10 MG tablet Take 10 mg by mouth daily.   unknown at unknown  . warfarin (COUMADIN) 5 MG tablet Take 1 tablet (5 mg total) by mouth daily at 6 PM. 30 tablet 0 unknown at unknown    Musculoskeletal: Strength & Muscle Tone: within normal limits Gait & Station: normal Patient leans: N/A  Psychiatric Specialty Exam: I reviewed physical examination performed in the ER and agree with the findings. Physical Exam  Nursing note and vitals reviewed. Psychiatric: His affect is blunt. His speech is delayed. He is slowed, withdrawn and actively hallucinating. Thought content is paranoid. Cognition and memory are normal. He expresses impulsivity. He exhibits a depressed mood.    Review of Systems  Neurological: Negative.   Psychiatric/Behavioral: Positive for depression and hallucinations.  All other systems reviewed and are negative.   Blood pressure 112/82, pulse 76, temperature 97.6 F (36.4 C), temperature source Oral, resp. rate 18, height 5\' 10"  (1.778 m), weight 86.6 kg (191 lb), SpO2 99 %.Body mass index is 27.41 kg/m.  See SRA                                                  Sleep:  Number of Hours: 7    Treatment Plan Summary: Daily contact with patient to assess and evaluate symptoms and progress in treatment and Medication management   Mr. Ripberger is a 42 year old male with a history of schizophrenia admitted for worsening of psychosis with auditory and visual hallucinations and suicidal thinking.  #Suicidal ideation -patient is able to contract for safety in the hospital  #Mood and psychosis, AH/VH/paranoia -continue Clozapine 500 mg nightly, level pending -continue Lithium 450 mg BID, level pending -continue Wellbutrin 150 mg daily   #Insomnia -continue Trazodone 100 mg nightly  #DM -ADA diet,  BS monitoring, SSI -continue Metformin 500 mg BID -continue Levemir 14 units daily  #Dyslipidemia -continue Zocor 40 mg daily  #HTN -Lisinopril 2.5 mg  daily  #Constipation -continue bowel regimen  #H/O pulmonary embolism -on coumadin per pharmacy dosing  #Smoking cessation -nicotine patch is available  #COPD -continue inhalers  #Metabolic syndrome monitoring -lipid panel, TSH and HgbA1C -EKG  #Disposition -discharge to his group home -follow up with [name]    Observation Level/Precautions:  15 minute checks  Laboratory:  CBC Chemistry Profile HbAIC UDS UA INR  Psychotherapy:    Medications:    Consultations:    Discharge Concerns:    Estimated LOS:  Other:     Physician Treatment Plan for Primary Diagnosis: Schizophrenia, undifferentiated (HCC) Long Term Goal(s): Improvement in symptoms so as ready for discharge  Short Term Goals: Ability to identify changes in lifestyle to reduce recurrence of condition will improve, Ability to verbalize feelings will improve, Ability to disclose and discuss suicidal ideas, Ability to demonstrate self-control will improve, Ability to identify and develop effective coping behaviors will improve and Ability to identify triggers associated with substance abuse/mental health issues will improve  Physician Treatment Plan for Secondary Diagnosis: Principal Problem:   Schizophrenia, undifferentiated (HCC) Active Problems:   Diabetes mellitus without complication (HCC)   Hyperlipidemia   Chronic anticoagulation   Tobacco use disorder  Long Term Goal(s): NA  Short Term Goals: NA  I certify that inpatient services furnished can reasonably be expected to improve the patient's condition.    Kristine Linea, MD 4/9/201910:09 AM

## 2017-09-21 NOTE — Plan of Care (Signed)
Patient up ad lib with steady gait. Continues to endorse auditory hallucinations and can not make out what the voices are saying. Also endorses passive SI with verbal contract for safety. BS monitored with coverage administered per order. Up in the milieu with steady gait. Minimal interaction with peers noted. Compliant with meals and medication. Will continue to monitor.

## 2017-09-21 NOTE — Plan of Care (Signed)
Patient is cooperating with care and aware of his medication regimen, verbalize no concerns, denies hearing voices at this time, contract for safety , sleeping long hours  and 15 minute safety rounds in progress. Problem: Education: Goal: Knowledge of [place] [facility] Education information/materials will improve Outcome: Progressing Goal: Emotional status will improve Outcome: Progressing Goal: Mental status will improve Outcome: Progressing Goal: Verbalization of understanding the information provided will improve Outcome: Progressing   Problem: Activity: Goal: Interest or engagement in activities will improve Outcome: Progressing Goal: Sleeping patterns will improve Outcome: Progressing   Problem: Coping: Goal: Coping ability will improve Outcome: Progressing Goal: Will verbalize feelings Outcome: Progressing   Problem: Health Behavior/Discharge Planning: Goal: Ability to make decisions will improve Outcome: Progressing Goal: Compliance with therapeutic regimen will improve Outcome: Progressing   Problem: Role Relationship: Goal: Will demonstrate positive changes in social behaviors and relationships Outcome: Progressing   Problem: Safety: Goal: Ability to disclose and discuss suicidal ideas will improve Outcome: Progressing Goal: Ability to identify and utilize support systems that promote safety will improve Outcome: Progressing   Problem: Self-Concept: Goal: Will verbalize positive feelings about self Outcome: Progressing Goal: Level of anxiety will decrease Outcome: Progressing

## 2017-09-21 NOTE — Progress Notes (Signed)
CSW spoke with Liborio NixonJanice, team lead on pt's [facility] Team [facility]. Liborio NixonJanice reported she will come to visit pt tomorrow and will discuss pt's progress with CSW. CSW will follow up as needed for updates/[facility].   Heidi DachKelsey Brent Noto, MSW, LCSW Clinical Social Worker 09/21/2017 3:38 PM

## 2017-09-21 NOTE — [facility] Suicide Risk Assessment (Signed)
[facility] Admission Suicide Risk Assessment   Nursing information obtained from:  Patient Demographic factors:  Male, Unemployed Current Mental Status:  Suicidal ideation indicated by patient Loss Factors:  NA Historical Factors:  NA Risk Reduction Factors:  NA  Total Time spent with patient: 1 hour Principal Problem: <principal problem not specified> Diagnosis:   Patient Active Problem List   Diagnosis Date Noted  . Schizophrenia, undifferentiated (HCC) [F20.3] 09/20/2017  . Tobacco use disorder [F17.200] 09/01/2016  . Schizoaffective disorder, bipolar type (HCC) [F25.0] 05/22/2016  . Diabetes mellitus without complication (HCC) [E11.9] 05/21/2016  . Hyperlipidemia [E78.5] 05/21/2016  . History of pulmonary embolism [Z86.711] 05/21/2016  . Chronic anticoagulation [Z79.01] 05/21/2016  . Hypertension [I10] 09/25/2015   Subjective Data: suicidal ideation  Continued Clinical Symptoms:  Alcohol Use Disorder Identification Test Final Score (AUDIT): 0 The "Alcohol Use Disorders Identification Test", Guidelines for Use in Primary Care, Second Edition.  World Science writer [facility]). Score between 0-7:  no or low risk or alcohol related problems. Score between 8-15:  moderate risk of alcohol related problems. Score between 16-19:  high risk of alcohol related problems. Score 20 or above:  warrants further diagnostic evaluation for alcohol dependence and treatment.   CLINICAL FACTORS:   Depression:   Severe Schizophrenia:   Depressive state Paranoid or undifferentiated type More than one psychiatric diagnosis Currently Psychotic Previous Psychiatric Diagnoses and Treatments Medical Diagnoses and Treatments/Surgeries   Musculoskeletal: Strength & Muscle Tone: within normal limits Gait & Station: normal Patient leans: N/A  Psychiatric Specialty Exam: Physical Exam  Nursing note and vitals reviewed. Psychiatric: Judgment normal. His affect is blunt. His speech is delayed. He is  slowed, withdrawn and actively hallucinating. Thought content is paranoid. Cognition and memory are normal. He exhibits a depressed mood.    Review of Systems  Neurological: Negative.   Psychiatric/Behavioral: Positive for depression and hallucinations.  All other systems reviewed and are negative.   Blood pressure 112/82, pulse 76, temperature 97.6 F (36.4 C), temperature source Oral, resp. rate 18, height 5\' 10"  (1.778 m), weight 86.6 kg (191 lb), SpO2 99 %.Body mass index is 27.41 kg/m.  General Appearance: Disheveled  Eye Contact:  Minimal  Speech:  Slow  Volume:  Decreased  Mood:  Depressed  Affect:  Blunt  Thought Process:  Goal Directed and Descriptions of Associations: Intact  Orientation:  Full (Time, Place, and Person)  Thought Content:  Hallucinations: Auditory Visual and Paranoid Ideation  Suicidal Thoughts:  Yes.  with intent/plan  Homicidal Thoughts:  No  Memory:  Immediate;   Fair Recent;   Fair Remote;   Fair  Judgement:  Fair  Insight:  Shallow  Psychomotor Activity:  Psychomotor Retardation  Concentration:  Concentration: Fair and Attention Span: Fair  Recall:  [employer] of Knowledge:  Fair  Language:  Fair  Akathisia:  No  Handed:  Right  AIMS (if indicated):     Assets:  Communication Skills Desire for Improvement Financial Resources/Insurance Housing Resilience Social Support  ADL's:  Intact  Cognition:  WNL  Sleep:  Number of Hours: 7      COGNITIVE FEATURES THAT CONTRIBUTE TO RISK:  None    SUICIDE RISK:   Moderate:  Frequent suicidal ideation with limited intensity, and duration, some specificity in terms of plans, no associated intent, good self-control, limited dysphoria/symptomatology, some risk factors present, and identifiable protective factors, including available and accessible social support.  PLAN OF CARE: hospital admission, medication management, discharge planning.  Mr. Grieder is a  42 year old male with a history of  schizophrenia admitted for worsening of psychosis with auditory and visual hallucinations and suicidal thinking.  #Suicidal ideation -patient is able to contract for safety in the hospital  #Mood and psychosis, AH/VH/paranoia -continue Clozapine 500 mg nightly, level pending -continue Lithium 450 mg BID, level pending -continue Wellbutrin 150 mg daily   #Insomnia -continue Trazodone 100 mg nightly  #DM -ADA diet, BS monitoring, SSI -continue Metformin 500 mg BID -continue Levemir 14 units daily  #Dyslipidemia -continue Zocor 40 mg daily  #HTN -Lisinopril 2.5 mg daily  #Constipation -continue bowel regimen  #H/O pulmonary embolism -on coumadin per pharmacy dosing  #Smoking cessation -nicotine patch is available  #COPD -continue inhalers  #Metabolic syndrome monitoring -lipid panel, TSH and HgbA1C -EKG  #Disposition -discharge to his group home -[facility]    I certify that inpatient services furnished can reasonably be expected to improve the patient's condition.   Kristine LineaJolanta Annaston Upham, MD 09/21/2017, 9:50 AM

## 2017-09-21 NOTE — Progress Notes (Signed)
CSW faxed releases to pt's guardian, Melina FiddlerVivan Harris, with a request for her to sign them and fax back. CSW also requested a copy of pt's guardianship paperwork. CSW will follow up with pt's guardian as needed.   Heidi DachKelsey Nichola Cieslinski, MSW, LCSW Clinical Social Worker 09/21/2017 11:36 AM

## 2017-09-21 NOTE — [facility] Counselor (Signed)
09/21/2017 1PM  Type of Therapy/Topic:  Group Therapy:  Feelings about Diagnosis  Participation Level:  Minimal   Description of Group:   This group will allow patients to explore their thoughts and feelings about diagnoses they have received. Patients will be guided to explore their level of understanding and acceptance of these diagnoses. Facilitator will encourage patients to process their thoughts and feelings about the reactions of others to their diagnosis and will guide patients in identifying ways to discuss their diagnosis with significant others in their lives. This group will be process-oriented, with patients participating in exploration of their own experiences, giving and receiving support, and processing challenge from other group members.   Therapeutic Goals: 1. Patient will demonstrate understanding of diagnosis as evidenced by identifying two or more symptoms of the disorder 2. Patient will be able to express two feelings regarding the diagnosis 3. Patient will demonstrate their ability to communicate their needs through discussion and/or role play  Summary of Patient Progress: Lance [name] stayed the majority time. He reports feeling "so,so". He did not verbally participate in the group.       Therapeutic Modalities:   Cognitive Behavioral Therapy Brief Therapy Feelings Identification    Lance [name]  Lance Vallely, LCSW 09/21/2017 1:47 PM

## 2017-09-21 NOTE — Progress Notes (Signed)
Recreation Therapy Notes   Date: 09/21/2017  Time: 9:30 am   Location: Craft Room   Behavioral response: N/A   Intervention Topic: Goals   Discussion/Intervention: Patient did not attend group.   Clinical Observations/Feedback:  Patient did not attend group.   Ananda Sitzer LRT/CTRS        Catrice Zuleta 09/21/2017 10:55 AM

## 2017-09-21 NOTE — [facility] Group Notes (Signed)
[facility] Group Notes:  (Nursing/MHT/Case Management/Adjunct)  Date:  09/21/2017  Time:  9:04 PM  Type of Therapy:  Group Therapy  Participation Level:  Active  Participation Quality:  Appropriate  Affect:  Appropriate  Cognitive:  Alert  Insight:  Good  Engagement in Group:  Engaged  Modes of Intervention:  Support  Summary of Progress/Problems:  Lance [name] 09/21/2017, 9:04 PM

## 2017-09-21 NOTE — [facility] Group Notes (Signed)
  09/21/2017  Time: 0900  Type of Therapy and Topic: Group Therapy: Goals Group: SMART Goals   Participation Level:  Did Not Attend   Description of Group:   The purpose of a daily goals group is to assist and guide patients in setting recovery/wellness-related goals. The objective is to set goals as they relate to the crisis in which they were admitted. Patients will be using SMART goal modalities to set measurable goals. Characteristics of realistic goals will be discussed and patients will be assisted in setting and processing how one will reach their goal. Facilitator will also assist patients in applying interventions and coping skills learned in psycho-education groups to the SMART goal and process how one will achieve defined goal.   Therapeutic Goals:  -Patients will develop and document one goal related to or their crisis in which brought them into treatment.  -Patients will be guided by LCSW using SMART goal setting modality in how to set a measurable, attainable, realistic and time sensitive goal.  -Patients will process barriers in reaching goal.  -Patients will process interventions in how to overcome and successful in reaching goal.   Patient's Goal:  Pt was invited to attend group but chose not to attend. CSW will continue to encourage pt to attend group throughout their admission.   Therapeutic Modalities:  Motivational Interviewing  Cognitive Behavioral Therapy  Crisis Intervention Model  SMART goals setting   Heidi DachKelsey Nasim Garofano, MSW, LCSW Clinical Social Worker 09/21/2017 9:31 AM

## 2017-09-22 LAB — LIPID PANEL
CHOLESTEROL: 119 mg/dL (ref 0–200)
HDL: 27 mg/dL — AB (ref >40)
LDL Cholesterol: 37 mg/dL (ref 0–99)
Total CHOL/HDL Ratio: 4.4 RATIO
Triglycerides: 277 mg/dL — ABNORMAL HIGH (ref <150)
VLDL: 55 mg/dL — AB (ref 0–40)

## 2017-09-22 LAB — PROTIME-INR
INR: 1.89
Prothrombin Time: 21.5 seconds — ABNORMAL HIGH (ref 11.4–15.2)

## 2017-09-22 LAB — GLUCOSE, CAPILLARY
GLUCOSE-CAPILLARY: 99 mg/dL (ref 65–99)
GLUCOSE-CAPILLARY: 91 mg/dL (ref 65–99)
Glucose-Capillary: 105 mg/dL — ABNORMAL HIGH (ref 65–99)
Glucose-Capillary: 119 mg/dL — ABNORMAL HIGH (ref 65–99)

## 2017-09-22 NOTE — Progress Notes (Signed)
CSW called Lance [name], pt's ACT Team Lead, at [phone].   CSW asked Lance [name] about possibly getting pt involved in a day program upon his discharge from [facility]. Lance [name] stated, "We can talk to him and his guardian about it.I'll talk to his guardian. We've had Lance [name] a long time. He was at a family care home where he had quite a bit of community involvement, but he moved and he no longer has a connection to the community--but we'll talk to his guardian about it."   CSW asked Lance [name] about the distribution of money at pt's current family care home. Lance [name] stated, "My understanding is that people get 47$66. Some family care homes make them pay for copays out of that and some don't. I'm thinking his last family care home didn't make him do that, and this guy (at his current family care home) has not been asking Lance [name] to pay for copays and haircuts out of his $66--but he is now choosing to stop. That's why they got into an argument and that's why he left.  I called the emergency room because I didn't think he should be admitted because he does this and walks off--and he goes to the hospital for vacation."   Lance [name] added that she is not able to come visit pt today but will come tomorrow, 09/23/17. CSW will continue to coordinate with pt as needed for updates/discharge planning.   Lance [name], MSW, LCSW Clinical Social Worker 09/22/2017 9:45 AM

## 2017-09-22 NOTE — Progress Notes (Signed)
[facility] MD Progress Note  09/22/2017 12:43 PM Lance [name]  MRN:  [id number]  Subjective:    Lance [name] met with treatment team today. He reports severe depression with hopelessness. He still has auditory and visual hallucinations. He is very disappointed that his guardian requests that he returns to the same group home. He bitterly complains that the group owner changed the rules last week and no longer gives him any money. Hiss $66 is now going to pay for medications copays. The patient is responsible for haircuts and hygiene products now. He knows he will not be able to afford it. There is no money for cigarettes. He complains that there is absolutely nothing to do at the group home and residents sleep all day long. ACT team nurse will see the patient tomorrow. We wonder, if the patient would benefit from day program participation.   Principal Problem: Schizophrenia, undifferentiated ([place]) Diagnosis:   Patient Active Problem List   Diagnosis Date Noted  . 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 Tobacco use disorder [F17.200] 09/01/2016  . Schizoaffective disorder, bipolar type ([place]) [F25.0] 05/22/2016  . Diabetes mellitus without complication ([place]) [A57.9] 05/21/2016  . Hyperlipidemia [E78.5] 05/21/2016  . History of pulmonary embolism [Z86.711] 05/21/2016  . Chronic anticoagulation [Z79.01] 05/21/2016  . Hypertension [I10] 09/25/2015   Total Time spent with patient: 30 minutes  Past Psychiatric History: schizophrenia  Past Medical History:  Past Medical History:  Diagnosis Date  . Depression   . Diabetes mellitus without complication ([place])   . Hyperlipidemia   . Hypertension   . Lupus anticoagulant disorder ([place]) 06/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  . PE (pulmonary thromboembolism) ([place])   . Schizo affective schizophrenia ([place])   . Supratherapeutic INR 11/19/2016   History reviewed. No pertinent surgical history. Family  History:  Family History  Problem Relation Age of Onset  . CAD Mother   . CAD Sister    Family Psychiatric  History: mother with mental illness Social History:  Social History   Substance and Sexual Activity  Alcohol Use No   Comment: occassionally     Social History   Substance and Sexual Activity  Drug Use Yes  . Types: Marijuana   Comment: Last use 11/29/16    Social History   Socioeconomic History  . Marital status: Single    Spouse name: Not on file  . Number of children: Not on file  . Years of education: Not on file  . Highest education level: Not on file  Occupational History  . Not on file  Social Needs  . Financial resource strain: Not on file  . Food insecurity:    Worry: Not on file    Inability: Not on file  . Transportation needs:    Medical: Not on file    Non-medical: Not on file  Tobacco Use  . Smoking status: Current Every Day Smoker    Packs/day: 1.00    Years: 23.00    Pack years: 23.00    Types: Cigarettes  . Smokeless tobacco: Never Used  Substance and Sexual Activity  . Alcohol use: No    Comment: occassionally  . Drug use: Yes    Types: Marijuana    Comment: Last use 11/29/16  . Sexual activity: Never    Birth control/protection: None    Comment: occasional marijuana- none recently  Lifestyle  . Physical activity:    Days per week: Not on file  Minutes per session: Not on file  . Stress: Not on file  Relationships  . Social connections:    Talks on phone: Not on file    Gets together: Not on file    Attends religious service: Not on file    Active member of club or organization: Not on file    Attends meetings of clubs or organizations: Not on file    Relationship status: Not on file  Other Topics Concern  . Not on file  Social History Narrative   From a group home in [place]   Additional Social History:                         Sleep: Fair  Appetite:  Fair  Current Medications: Current  Facility-Administered Medications  Medication Dose Route Frequency Provider Last Rate Last Dose  . acetaminophen (TYLENOL) tablet 650 mg  650 mg Oral Q6H PRN Clapacs, [name], MD      . albuterol (PROVENTIL HFA;VENTOLIN HFA) 108 (90 Base) MCG/ACT inhaler 2 puff  2 puff Inhalation Q6H PRN Clapacs, [name], MD      . alum & mag hydroxide-simeth (MAALOX/MYLANTA) 200-200-20 MG/5ML suspension 30 mL  30 mL Oral Q4H PRN Clapacs, [name], MD      . buPROPion (WELLBUTRIN XL) 24 hr tablet 150 mg  150 mg Oral Daily Clapacs, Madie Reno, MD   150 mg at 09/22/17 0807  . cloZAPine (CLOZARIL) tablet 500 mg  500 mg Oral QHS Clapacs, Madie Reno, MD   500 mg at 09/21/17 2108  . docusate sodium (COLACE) capsule 200 mg  200 mg Oral BID Clapacs, Madie Reno, MD   200 mg at 09/22/17 0807  . insulin aspart (novoLOG) injection 0-15 Units  0-15 Units Subcutaneous TID WC Pucilowska, Jolanta B, MD   2 Units at 09/21/17 1136  . insulin aspart (novoLOG) injection 0-5 Units  0-5 Units Subcutaneous QHS Pucilowska, Jolanta B, MD   0 Units at 09/21/17 0000  . insulin detemir (LEVEMIR) injection 14 Units  14 Units Subcutaneous Daily Clapacs, Madie Reno, MD   14 Units at 09/22/17 [phone]  . ipratropium (ATROVENT) 0.06 % nasal spray 1 spray  1 spray Each Nare QHS Clapacs, Madie Reno, MD   1 spray at 09/21/17 2109  . lisinopril (PRINIVIL,ZESTRIL) tablet 2.5 mg  2.5 mg Oral Daily Clapacs, Madie Reno, MD   2.5 mg at 09/22/17 0807  . lithium carbonate (ESKALITH) CR tablet 450 mg  450 mg Oral Q12H Clapacs, Madie Reno, MD   450 mg at 09/22/17 0807  . magnesium hydroxide (MILK OF MAGNESIA) suspension 30 mL  30 mL Oral Daily PRN Clapacs, [name], MD      . metFORMIN (GLUCOPHAGE) tablet 500 mg  500 mg Oral BID WC Clapacs, Madie Reno, MD   500 mg at 09/22/17 0807  . mometasone-formoterol (DULERA) 200-5 MCG/ACT inhaler 2 puff  2 puff Inhalation BID Clapacs, Madie Reno, MD   2 puff at 09/22/17 0818  . nicotine (NICODERM CQ - dosed in mg/24 hours) patch 21 mg  21 mg Transdermal Q0600  Clapacs, Madie Reno, MD   21 mg at 09/22/17 6767  . polyethylene glycol (MIRALAX / GLYCOLAX) packet 17 g  17 g Oral Daily Clapacs, Madie Reno, MD   17 g at 09/22/17 0807  . simvastatin (ZOCOR) tablet 40 mg  40 mg Oral q1800 Clapacs, Madie Reno, MD   40 mg at 09/21/17 1717  . traZODone (DESYREL)  tablet 100 mg  100 mg Oral QHS Clapacs, Madie Reno, MD   100 mg at 09/21/17 2108  . warfarin (COUMADIN) tablet 10 mg  10 mg Oral q1800 Clapacs, Madie Reno, MD   10 mg at 09/21/17 1717  . Warfarin - Pharmacist Dosing Inpatient   Does not apply q1800 Clapacs, Madie Reno, MD        Lab Results:  Results for orders placed or performed during the hospital encounter of 09/20/17 (from the past 48 hour(s))  CBC with Differential/Platelet     Status: None   Collection Time: 09/20/17  7:15 PM  Result Value Ref Range   WBC 6.0 3.8 - 10.6 K/uL   RBC 4.90 4.40 - 5.90 MIL/uL   Hemoglobin 15.5 13.0 - 18.0 g/dL   HCT 45.1 40.0 - 52.0 %   MCV 92.1 80.0 - 100.0 fL   MCH 31.7 26.0 - 34.0 pg   MCHC 34.4 32.0 - 36.0 g/dL   RDW 13.2 11.5 - 14.5 %   Platelets 206 150 - 440 K/uL   Neutrophils Relative % 63 %   Neutro Abs 3.8 1.4 - 6.5 K/uL   Lymphocytes Relative 24 %   Lymphs Abs 1.4 1.0 - 3.6 K/uL   Monocytes Relative 13 %   Monocytes Absolute 0.8 0.2 - 1.0 K/uL   Eosinophils Relative 0 %   Eosinophils Absolute 0.0 0 - 0.7 K/uL   Basophils Relative 0 %   Basophils Absolute 0.0 0 - 0.1 K/uL    Comment: Performed at [facility], [address], [place], [place] [address]  Protime-INR     Status: Abnormal   Collection Time: 09/20/17  7:15 PM  Result Value Ref Range   Prothrombin Time 17.3 (H) 11.4 - 15.2 seconds   INR 1.43     Comment: Performed at [facility], [address] [place], [place] [address]  Lithium level     Status: None   Collection Time: 09/20/17  7:15 PM  Result Value Ref Range   Lithium Lvl 0.84 0.60 - 1.20 mmol/L    Comment: Performed at [facility], [address]  [place], [place] [address]  Glucose, capillary     Status: Abnormal   Collection Time: 09/21/17  7:06 AM  Result Value Ref Range   Glucose-Capillary 100 (H) 65 - 99 mg/dL  Ethanol     Status: None   Collection Time: 09/21/17  7:47 AM  Result Value Ref Range   Alcohol, Ethyl (B) <10 <10 mg/dL    Comment:        LOWEST DETECTABLE LIMIT FOR SERUM ALCOHOL IS 10 mg/dL FOR MEDICAL PURPOSES ONLY Performed at [facility], [address] [place], [place] [address]   Hemoglobin A1c     Status: Abnormal   Collection Time: 09/21/17  7:47 AM  Result Value Ref Range   Hgb A1c MFr Bld 4.7 (L) 4.8 - 5.6 %    Comment: (NOTE) Pre diabetes:          5.7%-6.4% Diabetes:              >6.4% Glycemic control for   <7.0% adults with diabetes    Mean Plasma Glucose 88.19 mg/dL    Comment: Performed at [facility], [place] [address] [place], [place] [address]  TSH     Status: None   Collection Time: 09/21/17  7:47 AM  Result Value Ref Range   TSH 3.910 0.350 - 4.500 uIU/mL  Comment: Performed by a 3rd Generation assay with a functional sensitivity of <=0.01 uIU/mL. Performed at [facility], [address] [place], [place] [address]   Protime-INR     Status: Abnormal   Collection Time: 09/21/17  7:47 AM  Result Value Ref Range   Prothrombin Time 17.2 (H) 11.4 - 15.2 seconds   INR 1.42     Comment: Performed at [facility], [address] [place], [place] [address]  Glucose, capillary     Status: Abnormal   Collection Time: 09/21/17 11:34 AM  Result Value Ref Range   Glucose-Capillary 134 (H) 65 - 99 mg/dL  Glucose, capillary     Status: Abnormal   Collection Time: 09/21/17  4:24 PM  Result Value Ref Range   Glucose-Capillary 114 (H) 65 - 99 mg/dL   Comment 1 Notify RN   Glucose, capillary     Status: Abnormal   Collection Time: 09/21/17  9:17 PM  Result Value Ref Range   Glucose-Capillary 132 (H) 65 - 99 mg/dL  Glucose, capillary     Status: Abnormal    Collection Time: 09/22/17  7:09 AM  Result Value Ref Range   Glucose-Capillary 105 (H) 65 - 99 mg/dL   Comment 1 Notify RN   Protime-INR     Status: Abnormal   Collection Time: 09/22/17 10:09 AM  Result Value Ref Range   Prothrombin Time 21.5 (H) 11.4 - 15.2 seconds   INR 1.89     Comment: Performed at [facility], [address] [place], [place] [address]  Lipid panel     Status: Abnormal   Collection Time: 09/22/17 10:09 AM  Result Value Ref Range   Cholesterol 119 0 - 200 mg/dL   Triglycerides 277 (H) <150 mg/dL   HDL 27 (L) >40 mg/dL   Total CHOL/HDL Ratio 4.4 RATIO   VLDL 55 (H) 0 - 40 mg/dL   LDL Cholesterol 37 0 - 99 mg/dL    Comment:        Total Cholesterol/HDL:CHD Risk Coronary Heart Disease Risk Table                     Men   Women  1/2 Average Risk   3.4   3.3  Average Risk       5.0   4.4  2 X Average Risk   9.6   7.1  3 X Average Risk  23.4   11.0        Use the calculated Patient Ratio above and the CHD Risk Table to determine the patient's CHD Risk.        ATP III CLASSIFICATION (LDL):  <100     mg/dL   Optimal  100-129  mg/dL   Near or Above                    Optimal  130-159  mg/dL   Borderline  160-189  mg/dL   High  >190     mg/dL   Very High Performed at [facility], [address] [place],  [address]   Glucose, capillary     Status: Abnormal   Collection Time: 09/22/17 11:23 AM  Result Value Ref Range   Glucose-Capillary 119 (H) 65 - 99 mg/dL   Comment 1 Notify RN     Blood Alcohol level:  Lab Results  Component Value Date   ETH <10 09/21/2017   ETH <10 09/38/1829    Metabolic Disorder  Labs: Lab Results  Component Value Date   HGBA1C 4.7 (L) 09/21/2017   MPG 88.19 09/21/2017   MPG 105.41 03/03/2017   Lab Results  Component Value Date   PROLACTIN 2.0 (L) 08/31/2016   PROLACTIN 4.8 05/22/2016   Lab Results  Component Value Date   CHOL 119 09/22/2017   TRIG 277 (H) 09/22/2017   HDL 27 (L)  09/22/2017   CHOLHDL 4.4 09/22/2017   VLDL 55 (H) 09/22/2017   LDLCALC 37 09/22/2017   LDLCALC 59 03/03/2017    Physical Findings: AIMS:  , ,  ,  ,    CIWA:    COWS:     Musculoskeletal: Strength & Muscle Tone: within normal limits Gait & Station: normal Patient leans: N/A  Psychiatric Specialty Exam: Physical Exam  Nursing note and vitals reviewed. Psychiatric: His speech is normal. Judgment normal. His affect is blunt. He is actively hallucinating. Cognition and memory are normal. He exhibits a depressed mood. He expresses suicidal ideation.    Review of Systems  Neurological: Negative.   Psychiatric/Behavioral: Positive for depression, hallucinations and suicidal ideas.  All other systems reviewed and are negative.   Blood pressure 105/63, pulse 69, temperature 97.7 F (36.5 C), resp. rate 16, height _0  (1.778 m), weight 86.6 kg (191 lb), SpO2 97 %.Body mass index is 27.41 kg/m.  General Appearance: Casual  Eye Contact:  Fair  Speech:  Clear and Coherent  Volume:  Normal  Mood:  Depressed  Affect:  Flat  Thought Process:  Goal Directed and Descriptions of Associations: Intact  Orientation:  Full (Time, Place, and Person)  Thought Content:  Hallucinations: Auditory Visual  Suicidal Thoughts:  Yes.  with intent/plan  Homicidal Thoughts:  No  Memory:  Immediate;   Fair Recent;   Fair Remote;   Fair  Judgement:  Fair  Insight:  Fair  Psychomotor Activity:  Normal  Concentration:  Concentration: Fair and Attention Span: Fair  Recall:  [employer] of Knowledge:  Fair  Language:  Fair  Akathisia:  No  Handed:  Right  AIMS (if indicated):     Assets:  Communication Skills Desire for Improvement Financial Resources/Insurance Housing Resilience Social Support  ADL's:  Intact  Cognition:  WNL  Sleep:  Number of Hours: 6.45     Treatment Plan Summary: Daily contact with patient to assess and evaluate symptoms and progress in treatment and Medication  management   Lance [name] is a 42 year old male with a history of schizophrenia admitted for worsening of psychosis with auditory and visual hallucinations and suicidal thinking. THis is in the context of dramatic change in his financial situation leaving him hopeless.   #Suicidal ideation, still suicidal  -patient is able to contract for safety in the hospital  #Mood and psychosis, still hallucinates and is paranoid -continue Clozapine 500 mg nightly, level pending -continue Lithium 450 mg BID, level pending -continue Wellbutrin 150 mg daily   #Insomnia, improved on current medication -continue Trazodone 100 mg nightly  #DM, stable -ADA diet, BS monitoring, SSI -continue Metformin 500 mg BID -continue Levemir 14 units daily  #Dyslipidemia, elevated TG -continue Zocor 40 mg daily  #HTN, stable -Lisinopril 2.5 mg daily  #Constipation -continue bowel regimen  #H/O pulmonary embolism -on coumadin per pharmacy dosing  #Smoking cessation -nicotine patch is available  #COPD -continue inhalers  #Metabolic syndrome monitoring, labs reviewed -lipid panel shows elevated TG, HgbA1C 4.7 -EKG, reviewed QTc 499  #Social -incompetent adult -[place] is the guardian  #  Disposition -discharge to his group home -follow up with [facility] team    Orson Slick, MD 09/22/2017, 12:43 PM

## 2017-09-22 NOTE — Plan of Care (Signed)
Patient is calm and cooperative with care of ADLs , takes his medicines no noticeable side effects, pace the floor, voice no concerns, sleeping long hours without disturbances 15 minute safety checks is maintained no distress Problem: Education: Goal: Knowledge of [place] [facility] Education information/materials will improve Outcome: Progressing Goal: Emotional status will improve Outcome: Progressing Goal: Mental status will improve Outcome: Progressing Goal: Verbalization of understanding the information provided will improve Outcome: Progressing   Problem: Activity: Goal: Interest or engagement in activities will improve Outcome: Progressing Goal: Sleeping patterns will improve Outcome: Progressing   Problem: Coping: Goal: Coping ability will improve Outcome: Progressing Goal: Will verbalize feelings Outcome: Progressing   Problem: Health Behavior/Discharge Planning: Goal: Ability to make decisions will improve Outcome: Progressing Goal: Compliance with therapeutic regimen will improve Outcome: Progressing   Problem: Role Relationship: Goal: Will demonstrate positive changes in social behaviors and relationships Outcome: Progressing   Problem: Safety: Goal: Ability to disclose and discuss suicidal ideas will improve Outcome: Progressing Goal: Ability to identify and utilize support systems that promote safety will improve Outcome: Progressing   Problem: Self-Concept: Goal: Will verbalize positive feelings about self Outcome: Progressing Goal: Level of anxiety will decrease Outcome: Progressing

## 2017-09-22 NOTE — Tx Team (Addendum)
Interdisciplinary Treatment and Diagnostic Plan Update  09/22/2017 Time of Session: [address]1030 Lance [name] MRN: [id number]  Principal Diagnosis: Schizophrenia, undifferentiated (HCC)  Secondary Diagnoses: Principal Problem:   Schizophrenia, undifferentiated (HCC) Active Problems:   Diabetes mellitus without complication (HCC)   Hyperlipidemia   Chronic anticoagulation   Tobacco use disorder   Current Medications:  Current Facility-Administered Medications  Medication Dose Route Frequency Provider Last Rate Last Dose  . acetaminophen (TYLENOL) tablet 650 mg  650 mg Oral Q6H PRN Clapacs, [name], MD      . albuterol (PROVENTIL HFA;VENTOLIN HFA) 108 (90 Base) MCG/ACT inhaler 2 puff  2 puff Inhalation Q6H PRN Clapacs, [name], MD      . alum & mag hydroxide-simeth (MAALOX/MYLANTA) 200-200-20 MG/5ML suspension 30 mL  30 mL Oral Q4H PRN Clapacs, [name], MD      . buPROPion (WELLBUTRIN XL) 24 hr tablet 150 mg  150 mg Oral Daily Clapacs, Jackquline DenmarkJohn T, MD   150 mg at 09/22/17 0807  . cloZAPine (CLOZARIL) tablet 500 mg  500 mg Oral QHS Clapacs, Jackquline DenmarkJohn T, MD   500 mg at 09/21/17 2108  . docusate sodium (COLACE) capsule 200 mg  200 mg Oral BID Clapacs, Jackquline DenmarkJohn T, MD   200 mg at 09/22/17 0807  . insulin aspart (novoLOG) injection 0-15 Units  0-15 Units Subcutaneous TID WC Pucilowska, Jolanta B, MD   2 Units at 09/21/17 1136  . insulin aspart (novoLOG) injection 0-5 Units  0-5 Units Subcutaneous QHS Pucilowska, Jolanta B, MD   0 Units at 09/21/17 0000  . insulin detemir (LEVEMIR) injection 14 Units  14 Units Subcutaneous Daily Clapacs, Jackquline DenmarkJohn T, MD   14 Units at 09/22/17 [phone]  . ipratropium (ATROVENT) 0.06 % nasal spray 1 spray  1 spray Each Nare QHS Clapacs, Jackquline DenmarkJohn T, MD   1 spray at 09/21/17 2109  . lisinopril (PRINIVIL,ZESTRIL) tablet 2.5 mg  2.5 mg Oral Daily Clapacs, Jackquline DenmarkJohn T, MD   2.5 mg at 09/22/17 0807  . lithium carbonate (ESKALITH) CR tablet 450 mg  450 mg Oral Q12H Clapacs, Jackquline DenmarkJohn T, MD   450 mg at 09/22/17  0807  . magnesium hydroxide (MILK OF MAGNESIA) suspension 30 mL  30 mL Oral Daily PRN Clapacs, [name], MD      . metFORMIN (GLUCOPHAGE) tablet 500 mg  500 mg Oral BID WC Clapacs, Jackquline DenmarkJohn T, MD   500 mg at 09/22/17 0807  . mometasone-formoterol (DULERA) 200-5 MCG/ACT inhaler 2 puff  2 puff Inhalation BID Clapacs, Jackquline DenmarkJohn T, MD   2 puff at 09/22/17 0818  . nicotine (NICODERM CQ - dosed in mg/24 hours) patch 21 mg  21 mg Transdermal Q0600 Clapacs, Jackquline DenmarkJohn T, MD   21 mg at 09/22/17 [id number]  . polyethylene glycol (MIRALAX / GLYCOLAX) packet 17 g  17 g Oral Daily Clapacs, Jackquline DenmarkJohn T, MD   17 g at 09/22/17 0807  . simvastatin (ZOCOR) tablet 40 mg  40 mg Oral q1800 Clapacs, Jackquline DenmarkJohn T, MD   40 mg at 09/21/17 1717  . traZODone (DESYREL) tablet 100 mg  100 mg Oral QHS Clapacs, Jackquline DenmarkJohn T, MD   100 mg at 09/21/17 2108  . warfarin (COUMADIN) tablet 10 mg  10 mg Oral q1800 Clapacs, Jackquline DenmarkJohn T, MD   10 mg at 09/21/17 1717  . Warfarin - Pharmacist Dosing Inpatient   Does not apply q1800 Clapacs, Jackquline DenmarkJohn T, MD       PTA Medications: Medications Prior to Admission  Medication Sig Dispense Refill Last  Dose  . albuterol (PROVENTIL HFA;VENTOLIN HFA) 108 (90 Base) MCG/ACT inhaler Inhale 1-2 puffs into the lungs every 4 (four) hours as needed for wheezing or shortness of breath. 1 Inhaler 0 prn at prn  . buPROPion (WELLBUTRIN XL) 150 MG 24 hr tablet Take 1 tablet (150 mg total) by mouth daily. 30 tablet 0 unknown at unknown  . clindamycin (CLEOCIN) 150 MG capsule Take 2 capsules every 6 hours for infection 56 capsule 0   . cloZAPine (CLOZARIL) 100 MG tablet Take 5 tablets (500 mg total) by mouth at bedtime. 150 tablet 0 unknown at unknown  . docusate sodium (COLACE) 100 MG capsule Take 2 capsules (200 mg total) by mouth 2 (two) times daily. 120 capsule 0 unknown at unknown  . Fluticasone-Salmeterol (ADVAIR) 250-50 MCG/DOSE AEPB Inhale 1 puff into the lungs 2 (two) times daily.   unknown at unknown  . HYDROcodone-acetaminophen (NORCO/VICODIN) 5-325  MG tablet Take 1 tablet by mouth every 6 (six) hours as needed for moderate pain. 8 tablet 0   . insulin detemir (LEVEMIR) 100 UNIT/ML injection Inject 14 Units into the skin every morning.   unknown at unknown  . ipratropium (ATROVENT) 0.06 % nasal spray Place 1 spray into both nostrils at bedtime. 3 mL 0 unknown at unknown  . lisinopril (PRINIVIL,ZESTRIL) 2.5 MG tablet Take 2.5 mg by mouth daily.   unknown at unknown  . lithium carbonate (ESKALITH) 450 MG CR tablet Take 1 tablet (450 mg total) by mouth every 12 (twelve) hours. 60 tablet 0 unknown at unknown  . metformin (FORTAMET) 500 MG (OSM) 24 hr tablet Take 500 mg by mouth 2 (two) times daily with a meal.   unknown at unknown  . mometasone-formoterol (DULERA) 200-5 MCG/ACT AERO Inhale 2 puffs into the lungs 2 (two) times daily. 1 Inhaler 1 unknown at unknown  . omega-3 acid ethyl esters (LOVAZA) 1 g capsule Take 1 g by mouth daily.   unknown at unknown  . ondansetron (ZOFRAN ODT) 4 MG disintegrating tablet Take 1 tablet (4 mg total) by mouth every 8 (eight) hours as needed for nausea or vomiting. 20 tablet 0   . polyethylene glycol (MIRALAX / GLYCOLAX) packet Take 17 g by mouth daily. 30 each 0 prn at prn  . predniSONE (DELTASONE) 50 MG tablet Take one 50 mg tablet daily for the next five days. 5 tablet 0   . simvastatin (ZOCOR) 40 MG tablet Take 40 mg by mouth at bedtime.   unknown at unknown  . traZODone (DESYREL) 50 MG tablet Take 75 mg by mouth at bedtime.   unknown at unknown  . warfarin (COUMADIN) 10 MG tablet Take 10 mg by mouth daily.   unknown at unknown  . warfarin (COUMADIN) 5 MG tablet Take 1 tablet (5 mg total) by mouth daily at 6 PM. 30 tablet 0 unknown at unknown    Patient Stressors: Health problems Traumatic event  Patient Strengths: Average or above average intelligence Communication skills Motivation for treatment/growth  Treatment Modalities: Medication Management, Group therapy, Case management,  1 to 1 session  with clinician, Psychoeducation, Recreational therapy.   Physician Treatment Plan for Primary Diagnosis: Schizophrenia, undifferentiated (HCC) Long Term Goal(s): Improvement in symptoms so as ready for discharge NA   Short Term Goals: Ability to identify changes in lifestyle to reduce recurrence of condition will improve Ability to verbalize feelings will improve Ability to disclose and discuss suicidal ideas Ability to demonstrate self-control will improve Ability to identify and develop effective coping behaviors  will improve Ability to identify triggers associated with substance abuse/mental health issues will improve NA  Medication Management: Evaluate patient's response, side effects, and tolerance of medication regimen.  Therapeutic Interventions: 1 to 1 sessions, Unit Group sessions and Medication administration.  Evaluation of Outcomes: Progressing  Physician Treatment Plan for Secondary Diagnosis: Principal Problem:   Schizophrenia, undifferentiated (HCC) Active Problems:   Diabetes mellitus without complication (HCC)   Hyperlipidemia   Chronic anticoagulation   Tobacco use disorder  Long Term Goal(s): Improvement in symptoms so as ready for discharge NA   Short Term Goals: Ability to identify changes in lifestyle to reduce recurrence of condition will improve Ability to verbalize feelings will improve Ability to disclose and discuss suicidal ideas Ability to demonstrate self-control will improve Ability to identify and develop effective coping behaviors will improve Ability to identify triggers associated with substance abuse/mental health issues will improve NA     Medication Management: Evaluate patient's response, side effects, and tolerance of medication regimen.  Therapeutic Interventions: 1 to 1 sessions, Unit Group sessions and Medication administration.  Evaluation of Outcomes: Progressing   RN Treatment Plan for Primary Diagnosis: Schizophrenia,  undifferentiated (HCC) Long Term Goal(s): Knowledge of disease and therapeutic regimen to maintain health will improve  Short Term Goals: Ability to verbalize feelings will improve, Ability to identify and develop effective coping behaviors will improve and Compliance with prescribed medications will improve  Medication Management: RN will administer medications as ordered by provider, will assess and evaluate patient's response and provide education to patient for prescribed medication. RN will report any adverse and/or side effects to prescribing provider.  Therapeutic Interventions: 1 on 1 counseling sessions, Psychoeducation, Medication administration, Evaluate responses to treatment, Monitor vital signs and CBGs as ordered, Perform/monitor CIWA, COWS, AIMS and Fall Risk screenings as ordered, Perform wound care treatments as ordered.  Evaluation of Outcomes: Progressing   LCSW Treatment Plan for Primary Diagnosis: Schizophrenia, undifferentiated (HCC) Long Term Goal(s): Safe transition to appropriate next level of care at discharge, Engage patient in therapeutic group addressing interpersonal concerns.  Short Term Goals: Engage patient in aftercare planning with referrals and resources, Increase ability to appropriately verbalize feelings and Increase emotional regulation  Therapeutic Interventions: Assess for all discharge needs, 1 to 1 time with Social worker, Explore available resources and support systems, Assess for adequacy in community support network, Educate family and significant other(s) on suicide prevention, Complete Psychosocial Assessment, Interpersonal group therapy.  Evaluation of Outcomes: Progressing   Progress in Treatment: Attending groups: Yes. Participating in groups: Yes. Taking medication as prescribed: Yes. Toleration medication: Yes. Family/Significant other contact made: Yes, individual(s) contacted:  pt's guardian, Maureen Ralphs.  Patient understands diagnosis:  Yes. Discussing patient identified problems/goals with staff: Yes. Medical problems stabilized or resolved: Yes. Denies suicidal/homicidal ideation: Yes. Issues/concerns per patient self-inventory: No. Other: None at this time.   New problem(s) identified: No, Describe:  none at this time.   New Short Term/Long Term Goal(s): Pt reported his goal for treatment is to, "start to feel uplifted."   Discharge Plan or Barriers: Pt reported he is still experiencing depression and hallucinations--both visual and auditory; though, pt reports these symptoms have improved since yesterday. Pt reports not being satisfied with where he is living now, but understands he will return there upon discharge. Upon discharge, pt will return to his family care home and will continue [facility]T [facility] with [facility].   Reason for Continuation of Hospitalization: Depression Hallucinations Medication stabilization  Estimated Length of Stay: 3-5 days  Recreational Therapy: Patient Stressors: N/A Patient Goal: Patient will engage in groups without prompting or encouragement from LRT x3 group sessions within 5 recreation therapy group sessions  Attendees: Patient: Lance [name] 09/22/2017 11:25 AM  Physician: Dr. Jennet Maduro 09/22/2017 11:25 AM  Nursing: Hulan Amato, RN 09/22/2017 11:25 AM  RN Care Manager: 09/22/2017 11:25 AM  Social Worker: Heidi Dach, LCSW 09/22/2017 11:25 AM  Recreational Therapist:  09/22/2017 11:25 AM  Other: Johny Shears, LCSWA 09/22/2017 11:25 AM  Other: Jake Shark, LCSW 09/22/2017 11:25 AM  Other: 09/22/2017 11:25 AM    Scribe for Treatment Team: Heidi Dach, LCSW 09/22/2017 11:25 AM

## 2017-09-22 NOTE — Progress Notes (Signed)
ANTICOAGULATION CONSULT NOTE   Pharmacy Consult for Warfarin Indication: VTE prophylaxis  Allergies  Allergen Reactions  . Penicillins Other (See Comments)    Reaction: "lockjaw" Has patient had a PCN reaction causing immediate rash, facial/tongue/throat swelling, SOB or lightheadedness with hypotension: No Has patient had a PCN reaction causing severe rash involving mucus membranes or skin necrosis: No Has patient had a PCN reaction that required hospitalization: No Has patient had a PCN reaction occurring within the last 10 years: No If all of the above answers are "NO", then may proceed with Cephalosporin use.     Patient Measurements: Height: 5\' 10"  (177.8 cm) Weight: 191 lb (86.6 kg) IBW/kg (Calculated) : 73  Vital Signs: Temp: 97.7 F (36.5 C) (04/10 0613) BP: 105/63 (04/10 0613) Pulse Rate: 69 (04/10 0613)  Labs: Recent Labs    09/20/17 1316 09/20/17 1915 09/21/17 0747 09/22/17 1009  HGB 16.5 15.5  --   --   HCT 47.8 45.1  --   --   PLT 239 206  --   --   LABPROT  --  17.3* 17.2* 21.5*  INR  --  1.43 1.42 1.89  CREATININE 1.15  --   --   --     Estimated Creatinine Clearance: 85.5 mL/min (by C-G formula based on SCr of 1.15 mg/dL).   Medical History: Past Medical History:  Diagnosis Date  . Depression   . Diabetes mellitus without complication (HCC)   . Hyperlipidemia   . Hypertension   . Lupus anticoagulant disorder (HCC) 06/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  . PE (pulmonary thromboembolism) (HCC)   . Schizo affective schizophrenia (HCC)   . Supratherapeutic INR 11/19/2016    Medications:  Scheduled:  . buPROPion  150 mg Oral Daily  . cloZAPine  500 mg Oral QHS  . docusate sodium  200 mg Oral BID  . insulin aspart  0-15 Units Subcutaneous TID WC  . insulin aspart  0-5 Units Subcutaneous QHS  . insulin detemir  14 Units Subcutaneous Daily  . ipratropium  1 spray Each Nare QHS  . lisinopril  2.5 mg Oral Daily  . lithium carbonate   450 mg Oral Q12H  . metFORMIN  500 mg Oral BID WC  . mometasone-formoterol  2 puff Inhalation BID  . nicotine  21 mg Transdermal Q0600  . polyethylene glycol  17 g Oral Daily  . simvastatin  40 mg Oral q1800  . traZODone  100 mg Oral QHS  . warfarin  10 mg Oral q1800  . Warfarin - Pharmacist Dosing Inpatient   Does not apply q1800    Assessment: Warfarin ordered as per home dose   4/8   INR = 1.43   Warfarin 10 mg 4/9   INR = 1.42   Warfarin 10 mg 4/10 INR = 1.89     Goal of Therapy:  INR 2-3 Monitor platelets by anticoagulation protocol: Yes   Plan:  Give warfarin 10mg  tonight. Check INR with AM labs.   Hollan Philipp K, RPH 09/22/2017,2:11 PM

## 2017-09-22 NOTE — B[facility] Group Notes (Signed)
LCSW Group Therapy Note  09/22/2017 1:00pm  Type of Therapy/Topic:  Group Therapy:  Emotion Regulation  Participation Level:  Minimal   Description of Group:   The purpose of this group is to assist patients in learning to regulate negative emotions and experience positive emotions. Patients will be guided to discuss ways in which they have been vulnerable to their negative emotions. These vulnerabilities will be juxtaposed with experiences of positive emotions or situations, and patients will be challenged to use positive emotions to combat negative ones. Special emphasis will be placed on coping with negative emotions in conflict situations, and patients will process healthy conflict resolution skills.  Therapeutic Goals: 1. Patient will identify two positive emotions or experiences to reflect on in order to balance out negative emotions 2. Patient will label two or more emotions that they find the most difficult to experience 3. Patient will demonstrate positive conflict resolution skills through discussion and/or role plays  Summary of Patient Progress: Pt able to meet therapeutic goals. Verbalizes that to combat negative emotions he has to focus on what is actually in his control instead of what is not.  Therapeutic Modalities:   Cognitive Behavioral Therapy Feelings Identification Dialectical Behavioral Therapy   Lance [name] P Sadey Yandell, LCSW 09/22/2017 5:11 PM

## 2017-09-22 NOTE — Plan of Care (Signed)
Patient  educated  on [facility] information  in concrete form  Emotional and Mental  status  improved . Limited interaction with peers . Limited participation  with unit programs. Voice no concerns around sleep  Denies suicidal ideations    Problem: Education: Goal: Knowledge of [place] [facility] Education information/materials will improve Outcome: Progressing Goal: Emotional status will improve Outcome: Progressing Goal: Mental status will improve Outcome: Progressing Goal: Verbalization of understanding the information provided will improve Outcome: Progressing   Problem: Activity: Goal: Interest or engagement in activities will improve Outcome: Progressing Goal: Sleeping patterns will improve Outcome: Progressing   Problem: Coping: Goal: Coping ability will improve Outcome: Progressing Goal: Will verbalize feelings Outcome: Progressing   Problem: Health Behavior/Discharge Planning: Goal: Ability to make decisions will improve Outcome: Progressing Goal: Compliance with therapeutic regimen will improve Outcome: Progressing   Problem: Role Relationship: Goal: Will demonstrate positive changes in social behaviors and relationships Outcome: Progressing   Problem: Safety: Goal: Ability to disclose and discuss suicidal ideas will improve Outcome: Progressing Goal: Ability to identify and utilize support systems that promote safety will improve Outcome: Progressing   Problem: Self-Concept: Goal: Will verbalize positive feelings about self Outcome: Progressing Goal: Level of anxiety will decrease Outcome: Progressing

## 2017-09-22 NOTE — Progress Notes (Signed)
D:Patient pacing  Up and down halls . Compliant with medication , verbalize understanding . Appetite good . Limited participation  With peers . Writer informed MD on patient request of seeing a podiatrist. Patient  educated  on [facility] information  in concrete form  Emotional and Mental  status  improved . Limited interaction with peers . Limited participation  with unit programs. Voice no concerns around sleep  Denies suicidal ideations  A: Encourage patient participation with unit programming . Instruction  Given on  Medication , verbalize understanding. R: Voice no other concerns. Staff continue to monitor

## 2017-09-23 LAB — GLUCOSE, CAPILLARY
GLUCOSE-CAPILLARY: 143 mg/dL — AB (ref 65–99)
GLUCOSE-CAPILLARY: 97 mg/dL (ref 65–99)
Glucose-Capillary: 111 mg/dL — ABNORMAL HIGH (ref 65–99)
Glucose-Capillary: 160 mg/dL — ABNORMAL HIGH (ref 65–99)
Glucose-Capillary: 88 mg/dL (ref 65–99)

## 2017-09-23 LAB — PROTIME-INR
INR: 2.14
Prothrombin Time: 23.7 seconds — ABNORMAL HIGH (ref 11.4–15.2)

## 2017-09-23 LAB — CLOZAPINE (CLOZARIL)
CLOZAPINE LVL: 162 ng/mL — AB (ref 350–650)
NorClozapine: 182 ng/mL
Total(Cloz+Norcloz): 344 ng/mL

## 2017-09-23 NOTE — Progress Notes (Signed)
Patient ID: Lance [name], male   DOB: [birth year], 42 y.o.   MRN: [id number] Visible but limited in interactions with peers, avoidant, improved appearance, mood and affect flat "so, so". Denied SI/HI/AVH; denied pain, medication compliant. CBG=91 at bedtime.

## 2017-09-23 NOTE — Progress Notes (Addendum)
CSW spoke with pt's guardian, Lance [name], at  [phone] to alert her of pt's discharge. Pt's guardian was in agreement with this information. CSW will continue to coordinate with pt's guardian as needed for updates/discharge planning.   CSW contacted pt's group home owner, [name], at [phone] to discuss pt's discharge. [name] reported he will pick pt up at 10AM tomorrow, 09/24/17. [name] also requested an updated FL2 upon discharge. CSW will complete FL2 and will send it with pt's discharge paperwork. CSW will continue to coordinate with pt's group home as needed for updates/discharge planning.   CS[facility] for [employer] and clothing items requested by pt. [facility] needed.   Heidi DachKelsey Kynslee Baham, MSW, LCSW Clinical Social Worker 09/23/2017 11:39 AM

## 2017-09-23 NOTE — [facility] Group Notes (Signed)
LCSW Group Therapy Note 09/23/2017 9:00 AM  Type of Therapy and Topic:  Group Therapy:  Setting Goals  Participation Level:  Did Not Attend  Description of Group: In this process group, patients discussed using strengths to work toward goals and address challenges.  Patients identified two positive things about themselves and one goal they were working on.  Patients were given the opportunity to share openly and support each other's plan for self-empowerment.  The group discussed the value of gratitude and were encouraged to have a daily reflection of positive characteristics or circumstances.  Patients were encouraged to identify a plan to utilize their strengths to work on current challenges and goals.  Therapeutic Goals 1. Patient will verbalize personal strengths/positive qualities and relate how these can assist with achieving desired personal goals 2. Patients will verbalize affirmation of peers plans for personal change and goal setting 3. Patients will explore the value of gratitude and positive focus as related to successful achievement of goals 4. Patients will verbalize a plan for regular reinforcement of personal positive qualities and circumstances.  Summary of Patient Progress:  [name] was invited to today's group, but chose not to attend.     Therapeutic Modalities Cognitive Behavioral Therapy Motivational Interviewing    Alease FrameSonya S Amey Hossain, [place] 09/23/2017 2:02 PM

## 2017-09-23 NOTE — Plan of Care (Signed)
Patient slept for Estimated Hours of 7; Precautionary checks every 15 minutes for safety maintained, room free of safety hazards, patient sustains no injury or falls during this shift.  Problem: Education: Goal: Knowledge of [place] [facility] Education information/materials will improve Outcome: Progressing Goal: Emotional status will improve Outcome: Progressing Goal: Mental status will improve Outcome: Progressing Goal: Verbalization of understanding the information provided will improve Outcome: Progressing   Problem: Activity: Goal: Interest or engagement in activities will improve Outcome: Progressing Goal: Sleeping patterns will improve Outcome: Progressing   Problem: Coping: Goal: Coping ability will improve Outcome: Progressing Goal: Will verbalize feelings Outcome: Progressing   Problem: Health Behavior/Discharge Planning: Goal: Ability to make decisions will improve Outcome: Progressing Goal: Compliance with therapeutic regimen will improve Outcome: Progressing   Problem: Role Relationship: Goal: Will demonstrate positive changes in social behaviors and relationships Outcome: Progressing   Problem: Safety: Goal: Ability to disclose and discuss suicidal ideas will improve Outcome: Progressing Goal: Ability to identify and utilize support systems that promote safety will improve Outcome: Progressing   Problem: Self-Concept: Goal: Will verbalize positive feelings about self Outcome: Progressing Goal: Level of anxiety will decrease Outcome: Progressing

## 2017-09-23 NOTE — Progress Notes (Signed)
Recreation Therapy Notes  Date: 09/23/2017  Time: 9:30 am   Location: Craft Room   Behavioral response: N/A   Intervention Topic: Self-care   Discussion/Intervention: Patient did not attend group.   Clinical Observations/Feedback:  Patient did not attend group.          Alaine Loughney 09/23/2017 11:37 AM 

## 2017-09-23 NOTE — [facility] Group Notes (Signed)
09/23/2017  Time: 1PM  Type of Therapy/Topic:  Group Therapy:  Balance in Life  Participation Level:  Did Not Attend  Description of Group:   This group will address the concept of balance and how it feels and looks when one is unbalanced. Patients will be encouraged to process areas in their lives that are out of balance and identify reasons for remaining unbalanced. Facilitators will guide patients in utilizing problem-solving interventions to address and correct the stressor making their life unbalanced. Understanding and applying boundaries will be explored and addressed for obtaining and maintaining a balanced life. Patients will be encouraged to explore ways to assertively make their unbalanced needs known to significant others in their lives, using other group members and facilitator for support and feedback.  Therapeutic Goals: 1. Patient will identify two or more emotions or situations they have that consume much of in their lives. 2. Patient will identify signs/triggers that life has become out of balance:  3. Patient will identify two ways to set boundaries in order to achieve balance in their lives:  4. Patient will demonstrate ability to communicate their needs through discussion and/or role plays  Summary of Patient Progress: Pt was invited to attend group but chose not to attend. CSW will continue to encourage pt to attend group throughout their admission.   Therapeutic Modalities:   Cognitive Behavioral Therapy Solution-Focused Therapy Assertiveness Training  Heidi DachKelsey Mayana Irigoyen, MSW, LCSW Clinical Social Worker 09/23/2017 1:52 PM

## 2017-09-23 NOTE — Plan of Care (Signed)
Patient verbalizes understanding of the general information that's been given to him and has not voiced any further questions or concerns at this time. Patient denies SI/HI/AVH at this time. Patient also denies any signs/symptoms of anxiety at this time. Patient does endorse depression, rating his level a "5/10", but he can not explain to this writer why he's feeling this way. Patient has been in bed majority of the morning and has been out in the milieu during the afternoon. Patient has the ability to cope. Patient has the ability to make decisions regarding he health care and has been in compliance with his therapeutic/medication regimen thus far.  Patient has the ability to identify and utilize his support systems. Patient has been free from injury on the unit and remains safe at this time.  Problem: Education: Goal: Knowledge of [place] [facility] Education information/materials will improve Outcome: Progressing Goal: Emotional status will improve Outcome: Progressing Goal: Mental status will improve Outcome: Progressing Goal: Verbalization of understanding the information provided will improve Outcome: Progressing   Problem: Activity: Goal: Interest or engagement in activities will improve Outcome: Progressing Goal: Sleeping patterns will improve Outcome: Progressing   Problem: Coping: Goal: Coping ability will improve Outcome: Progressing Goal: Will verbalize feelings Outcome: Progressing   Problem: Health Behavior/Discharge Planning: Goal: Ability to make decisions will improve Outcome: Progressing Goal: Compliance with therapeutic regimen will improve Outcome: Progressing   Problem: Role Relationship: Goal: Will demonstrate positive changes in social behaviors and relationships Outcome: Progressing   Problem: Safety: Goal: Ability to disclose and discuss suicidal ideas will improve Outcome: Progressing Goal: Ability to identify and utilize support systems that promote  safety will improve Outcome: Progressing   Problem: Self-Concept: Goal: Will verbalize positive feelings about self Outcome: Progressing Goal: Level of anxiety will decrease Outcome: Progressing

## 2017-09-23 NOTE — Progress Notes (Addendum)
[facility] MD Progress Note  09/23/2017 2:30 PM Lance [name]  MRN:  [id number]  Subjective:   Lance [name] is still depressed and very anxious. He complains of auditory and visual hallucinations. He met with Thayer Headings from [facility] team and they together made a plan how to ease his financial difficulties as almost all his allowance is consumed by the cost of medications. He is unable to afford hygiene products or buy shoes. SW will contact charitable foundation for help with clothing.   We discussed independent living option but the patient is unsure if he could handle it "I will have to prepare".   Clozapine level came back today and is very low 162 while Li level is therapeutic. Lance [name] is not known to skip on medications. Spoke with [facility] pharamcist. One month supply of Clozapine 550 mg nightly was dispensed on 4/4. There might have been problems last month due to registry.  Principal Problem: Schizophrenia, undifferentiated ([place]) Diagnosis:   Patient Active Problem List   Diagnosis Date Noted  . 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 Tobacco use disorder [F17.200] 09/01/2016  . Schizoaffective disorder, bipolar type ([place]) [F25.0] 05/22/2016  . Diabetes mellitus without complication ([place]) [M27.0] 05/21/2016  . Hyperlipidemia [E78.5] 05/21/2016  . History of pulmonary embolism [Z86.711] 05/21/2016  . Chronic anticoagulation [Z79.01] 05/21/2016  . Hypertension [I10] 09/25/2015   Total Time spent with patient: 20 minutes  Past Psychiatric History: schizoaffective disorder  Past Medical History:  Past Medical History:  Diagnosis Date  . Depression   . Diabetes mellitus without complication ([place])   . Hyperlipidemia   . Hypertension   . Lupus anticoagulant disorder ([place]) 06/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  . PE (pulmonary thromboembolism) ([place])   . Schizo affective schizophrenia ([place])   . Supratherapeutic INR 11/19/2016   History  reviewed. No pertinent surgical history. Family History:  Family History  Problem Relation Age of Onset  . CAD Mother   . CAD Sister    Family Psychiatric  History: mother with mental illness. Social History:  Social History   Substance and Sexual Activity  Alcohol Use No   Comment: occassionally     Social History   Substance and Sexual Activity  Drug Use Yes  . Types: Marijuana   Comment: Last use 11/29/16    Social History   Socioeconomic History  . Marital status: Single    Spouse name: Not on file  . Number of children: Not on file  . Years of education: Not on file  . Highest education level: Not on file  Occupational History  . Not on file  Social Needs  . Financial resource strain: Not on file  . Food insecurity:    Worry: Not on file    Inability: Not on file  . Transportation needs:    Medical: Not on file    Non-medical: Not on file  Tobacco Use  . Smoking status: Current Every Day Smoker    Packs/day: 1.00    Years: 23.00    Pack years: 23.00    Types: Cigarettes  . Smokeless tobacco: Never Used  Substance and Sexual Activity  . Alcohol use: No    Comment: occassionally  . Drug use: Yes    Types: Marijuana    Comment: Last use 11/29/16  . Sexual activity: Never    Birth control/protection: None    Comment: occasional marijuana- none recently  Lifestyle  . Physical activity:  Days per week: Not on file    Minutes per session: Not on file  . Stress: Not on file  Relationships  . Social connections:    Talks on phone: Not on file    Gets together: Not on file    Attends religious service: Not on file    Active member of club or organization: Not on file    Attends meetings of clubs or organizations: Not on file    Relationship status: Not on file  Other Topics Concern  . Not on file  Social History Narrative   From a group home in [place]   Additional Social History:                         Sleep: Fair  Appetite:   Fair  Current Medications: Current Facility-Administered Medications  Medication Dose Route Frequency Provider Last Rate Last Dose  . acetaminophen (TYLENOL) tablet 650 mg  650 mg Oral Q6H PRN Clapacs, [name], MD      . albuterol (PROVENTIL HFA;VENTOLIN HFA) 108 (90 Base) MCG/ACT inhaler 2 puff  2 puff Inhalation Q6H PRN Clapacs, [name], MD      . alum & mag hydroxide-simeth (MAALOX/MYLANTA) 200-200-20 MG/5ML suspension 30 mL  30 mL Oral Q4H PRN Clapacs, [name], MD      . buPROPion (WELLBUTRIN XL) 24 hr tablet 150 mg  150 mg Oral Daily Clapacs, Madie Reno, MD   150 mg at 09/23/17 0925  . cloZAPine (CLOZARIL) tablet 500 mg  500 mg Oral QHS Clapacs, Madie Reno, MD   500 mg at 09/22/17 2122  . docusate sodium (COLACE) capsule 200 mg  200 mg Oral BID Clapacs, Madie Reno, MD   200 mg at 09/23/17 0925  . insulin aspart (novoLOG) injection 0-15 Units  0-15 Units Subcutaneous TID WC Sheldon Sem B, MD   2 Units at 09/23/17 1216  . insulin aspart (novoLOG) injection 0-5 Units  0-5 Units Subcutaneous QHS Selby Slovacek B, MD   0 Units at 09/21/17 0000  . insulin detemir (LEVEMIR) injection 14 Units  14 Units Subcutaneous Daily Clapacs, Madie Reno, MD   14 Units at 09/23/17 0935  . ipratropium (ATROVENT) 0.06 % nasal spray 1 spray  1 spray Each Nare QHS Clapacs, Madie Reno, MD   1 spray at 09/22/17 2127  . lisinopril (PRINIVIL,ZESTRIL) tablet 2.5 mg  2.5 mg Oral Daily Clapacs, Madie Reno, MD   2.5 mg at 09/23/17 0924  . lithium carbonate (ESKALITH) CR tablet 450 mg  450 mg Oral Q12H Clapacs, Madie Reno, MD   450 mg at 09/23/17 0924  . magnesium hydroxide (MILK OF MAGNESIA) suspension 30 mL  30 mL Oral Daily PRN Clapacs, [name], MD      . metFORMIN (GLUCOPHAGE) tablet 500 mg  500 mg Oral BID WC Clapacs, Madie Reno, MD   500 mg at 09/23/17 0924  . mometasone-formoterol (DULERA) 200-5 MCG/ACT inhaler 2 puff  2 puff Inhalation BID Clapacs, Madie Reno, MD   2 puff at 09/23/17 0925  . nicotine (NICODERM CQ - dosed in mg/24 hours) patch  21 mg  21 mg Transdermal Q0600 Clapacs, Madie Reno, MD   21 mg at 09/23/17 0657  . polyethylene glycol (MIRALAX / GLYCOLAX) packet 17 g  17 g Oral Daily Clapacs, Madie Reno, MD   17 g at 09/22/17 0807  . simvastatin (ZOCOR) tablet 40 mg  40 mg Oral q1800 Clapacs, Madie Reno, MD  40 mg at 09/22/17 1705  . traZODone (DESYREL) tablet 100 mg  100 mg Oral QHS Clapacs, Madie Reno, MD   100 mg at 09/22/17 2122  . warfarin (COUMADIN) tablet 10 mg  10 mg Oral q1800 Clapacs, Madie Reno, MD   10 mg at 09/22/17 1708  . Warfarin - Pharmacist Dosing Inpatient   Does not apply q1800 Clapacs, Madie Reno, MD        Lab Results:  Results for orders placed or performed during the hospital encounter of 09/20/17 (from the past 48 hour(s))  Glucose, capillary     Status: Abnormal   Collection Time: 09/21/17  4:24 PM  Result Value Ref Range   Glucose-Capillary 114 (H) 65 - 99 mg/dL   Comment 1 Notify RN   Glucose, capillary     Status: Abnormal   Collection Time: 09/21/17  9:17 PM  Result Value Ref Range   Glucose-Capillary 132 (H) 65 - 99 mg/dL  Glucose, capillary     Status: Abnormal   Collection Time: 09/22/17  7:09 AM  Result Value Ref Range   Glucose-Capillary 105 (H) 65 - 99 mg/dL   Comment 1 Notify RN   Protime-INR     Status: Abnormal   Collection Time: 09/22/17 10:09 AM  Result Value Ref Range   Prothrombin Time 21.5 (H) 11.4 - 15.2 seconds   INR 1.89     Comment: Performed at [facility], [address] [place], [place] [address]  Lipid panel     Status: Abnormal   Collection Time: 09/22/17 10:09 AM  Result Value Ref Range   Cholesterol 119 0 - 200 mg/dL   Triglycerides 277 (H) <150 mg/dL   HDL 27 (L) >40 mg/dL   Total CHOL/HDL Ratio 4.4 RATIO   VLDL 55 (H) 0 - 40 mg/dL   LDL Cholesterol 37 0 - 99 mg/dL    Comment:        Total Cholesterol/HDL:CHD Risk Coronary Heart Disease Risk Table                     Men   Women  1/2 Average Risk   3.4   3.3  Average Risk       5.0   4.4  2 X Average Risk    9.6   7.1  3 X Average Risk  23.4   11.0        Use the calculated Patient Ratio above and the CHD Risk Table to determine the patient's CHD Risk.        ATP III CLASSIFICATION (LDL):  <100     mg/dL   Optimal  100-129  mg/dL   Near or Above                    Optimal  130-159  mg/dL   Borderline  160-189  mg/dL   High  >190     mg/dL   Very High Performed at [address] [place], [place] [address]   Glucose, capillary     Status: Abnormal   Collection Time: 09/22/17 11:23 AM  Result Value Ref Range   Glucose-Capillary 119 (H) 65 - 99 mg/dL   Comment 1 Notify RN   Glucose, capillary     Status: None   Collection Time: 09/22/17  4:20 PM  Result Value Ref Range   Glucose-Capillary 99 65 - 99 mg/dL  Glucose, capillary     Status: None   Collection  Time: 09/22/17  8:49 PM  Result Value Ref Range   Glucose-Capillary 91 65 - 99 mg/dL  Glucose, capillary     Status: Abnormal   Collection Time: 09/23/17  7:11 AM  Result Value Ref Range   Glucose-Capillary 111 (H) 65 - 99 mg/dL  Glucose, capillary     Status: Abnormal   Collection Time: 09/23/17  9:30 AM  Result Value Ref Range   Glucose-Capillary 160 (H) 65 - 99 mg/dL  Glucose, capillary     Status: Abnormal   Collection Time: 09/23/17 11:46 AM  Result Value Ref Range   Glucose-Capillary 143 (H) 65 - 99 mg/dL  Protime-INR     Status: Abnormal   Collection Time: 09/23/17  1:28 PM  Result Value Ref Range   Prothrombin Time 23.7 (H) 11.4 - 15.2 seconds   INR 2.14     Comment: Performed at [facility], [address] [place], [place] [address]    Blood Alcohol level:  Lab Results  Component Value Date   [facility] <10 09/21/2017   ETH <10 96/29/5284    Metabolic Disorder Labs: Lab Results  Component Value Date   HGBA1C 4.7 (L) 09/21/2017   MPG 88.19 09/21/2017   MPG 105.41 03/03/2017   Lab Results  Component Value Date   PROLACTIN 2.0 (L) 08/31/2016   PROLACTIN 4.8 05/22/2016    Lab Results  Component Value Date   CHOL 119 09/22/2017   TRIG 277 (H) 09/22/2017   HDL 27 (L) 09/22/2017   CHOLHDL 4.4 09/22/2017   VLDL 55 (H) 09/22/2017   LDLCALC 37 09/22/2017   LDLCALC 59 03/03/2017    Physical Findings: AIMS:  , ,  ,  ,    CIWA:    COWS:     Musculoskeletal: Strength & Muscle Tone: within normal limits Gait & Station: normal Patient leans: N/A  Psychiatric Specialty Exam: Physical Exam  Nursing note and vitals reviewed. Psychiatric: His speech is normal and behavior is normal. Judgment and thought content normal. His affect is blunt. Cognition and memory are normal. He exhibits a depressed mood.    Review of Systems  Neurological: Negative.   Psychiatric/Behavioral: Positive for depression and hallucinations.  All other systems reviewed and are negative.   Blood pressure 107/74, pulse 71, temperature 97.7 F (36.5 C), temperature source Oral, resp. rate 16, height '5\' 10"'  (1.778 m), weight 86.6 kg (191 lb), SpO2 98 %.Body mass index is 27.41 kg/m.  General Appearance: Casual  Eye Contact:  Good  Speech:  Clear and Coherent  Volume:  Normal  Mood:  Anxious  Affect:  Depressed and Flat  Thought Process:  Goal Directed and Descriptions of Associations: Intact  Orientation:  Full (Time, Place, and Person)  Thought Content:  WDL  Suicidal Thoughts:  No  Homicidal Thoughts:  No  Memory:  Immediate;   Fair Recent;   Fair Remote;   Fair  Judgement:  Impaired  Insight:  Present  Psychomotor Activity:  Normal  Concentration:  Concentration: Fair and Attention Span: Fair  Recall:  [employer] of Knowledge:  Fair  Language:  Fair  Akathisia:  No  Handed:  Right  AIMS (if indicated):     Assets:  Communication Skills Desire for Improvement Financial Resources/Insurance Housing Resilience Social Support  ADL's:  Intact  Cognition:  WNL  Sleep:  Number of Hours: 7     Treatment Plan Summary: Daily contact with patient to assess and  evaluate symptoms and progress in treatment and  Medication management   Mr. Rathbone is a 42 year old male with a history of schizophrenia admitted for worsening of psychosis with auditory and visual hallucinations and suicidal thinking. THis is in the context of dramatic change in his financial situation leaving him hopeless.   #Suicidal ideation, still hopeless -patient is able to contract for safety in the hospital  #Mood and psychosis, hallucinations and paranoia still present, no improvement -continue Clozapine 500 mg nightly, level on 4/8 was 162 -continue Lithium 450 mg BID, level 4/8 was 0.84 -continue Wellbutrin 150 mg daily   #Insomnia, improved on current medication -continue Trazodone 100 mg nightly  #DM, stable -ADA diet, BS monitoring, SSI -continue Metformin 500 mg BID -continue Levemir 14 units daily  #Dyslipidemia, elevated TG -continue Zocor 40 mg daily  #HTN, stable -Lisinopril 2.5 mg daily  #Constipation -continue bowel regimen  #H/O pulmonary embolism -on coumadin per pharmacy dosing  #Smoking cessation -nicotine patch is available  #COPD -continue inhalers  #Metabolic syndrome monitoring, labs reviewed -lipid panel shows elevated TG, HgbA1C 4.7 -EKG, reviewed QTc 499  #Social -incompetent adult -[place] is the guardian  #Disposition -discharge to his group home -follow up with [facility] team     Orson Slick, MD 09/23/2017, 2:30 PM

## 2017-09-23 NOTE — [facility] Group Notes (Signed)
[facility] Group Notes:  (Nursing/MHT/Case Management/Adjunct)  Date:  09/23/2017  Time:  8:59 PM  Type of Therapy:  Group Therapy  Participation Level:  Did Not Attend  Summary of Progress/Problems:  Lance [name] 09/23/2017, 8:59 PM

## 2017-09-23 NOTE — Progress Notes (Signed)
D- Patient alert and oriented. Patient presents in a pleasant mood on assessment stating to this writer "I'm just so tired". Patient endorses a depression level of "5/10", but does not know why he's depressed. Patient denies SI, HI, AVH, and pain at this time. Patient also denies anxiety at this time. Patient's goal for today is "to get discharged", but patient states that he has not talked to MD about his discharge date.  A- Scheduled medications administered to patient, per MD orders. Support and encouragement provided.  Routine safety checks conducted every 15 minutes.  Patient informed to notify staff with problems or concerns.  R- No adverse drug reactions noted. Patient contracts for safety at this time. Patient compliant with medications and treatment plan. Patient receptive, calm, and cooperative. Patient interacts well with others on the unit.  Patient remains safe at this time.

## 2017-09-23 NOTE — [place] FL2 (Addendum)
[place] MEDICAID FL2 LEVEL OF CARE SCREENING TOOL     IDENTIFICATION  Patient Name: Lance [name] Birthdate: 04-Oct-1975 Sex: male Admission Date (Current Location): 09/20/2017  [place]   Facility and Address:  [facility], [address], [place], [place] [address]      Provider Number: [id number]  Attending Physician Name and Address:  Shari ProwsPucilowska, Jolanta B, MD  Relative Name and Phone Number:  guardian,  Victory DakinVivian Harris at  [phone]    Current Level of Care: Hospital Recommended Level of Care: Family Care Home Prior Approval Number:    Date Approved/Denied:   PASRR Number:    Discharge Plan: Other (Comment)(Family Care Home)    Current Diagnoses: Patient Active Problem List   Diagnosis Date Noted  . 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 Tobacco use disorder 09/01/2016  . Schizoaffective disorder, bipolar type (H[facility]) 05/22/2016  . Diabetes mellitus without complication ([facility]) 05/21/2016  . Hyperlipidemia 05/21/2016  . History of pulmonary embolism 05/21/2016  . Chronic anticoagulation 05/21/2016  . Hypertension 09/25/2015    Orientation RESPIRATION BLADDER Height & Weight     Self  Normal Continent Weight: 191 lb (86.6 kg) Height:  5\' 10"  (177.8 cm)  BEHAVIORAL SYMPTOMS/MOOD NEUROLOGICAL BOWEL NUTRITION STATUS  (None) (none) Continent Diet(Normal)  AMBULATORY STATUS COMMUNICATION OF NEEDS Skin   Independent Verbally Normal                       Personal Care Assistance Level of Assistance  (None)           Functional Limitations Info  (None)          SPECIAL CARE FACTORS FREQUENCY  (None)                    Contractures Contractures Info: Not present    Additional Factors Info  Allergies   Allergies Info: Penicillins           Current Medications (09/24/2017):  This is the current hospital active medication list Current Facility-Administered  Medications  Medication Dose Route Frequency Provider Last Rate Last Dose  . acetaminophen (TYLENOL) tablet 650 mg  650 mg Oral Q6H PRN Clapacs, [name], MD      . albuterol (PROVENTIL HFA;VENTOLIN HFA) 108 (90 Base) MCG/ACT inhaler 2 puff  2 puff Inhalation Q6H PRN Clapacs, [name], MD      . alum & mag hydroxide-simeth (MAALOX/MYLANTA) 200-200-20 MG/5ML suspension 30 mL  30 mL Oral Q4H PRN Clapacs, [name], MD      . buPROPion (WELLBUTRIN XL) 24 hr tablet 150 mg  150 mg Oral Daily Clapacs, Jackquline DenmarkJohn T, MD   150 mg at 09/24/17 0811  . cloZAPine (CLOZARIL) tablet 500 mg  500 mg Oral QHS Clapacs, Jackquline DenmarkJohn T, MD   500 mg at 09/23/17 2119  . docusate sodium (COLACE) capsule 200 mg  200 mg Oral BID Clapacs, Jackquline DenmarkJohn T, MD   200 mg at 09/24/17 0811  . insulin aspart (novoLOG) injection 0-15 Units  0-15 Units Subcutaneous TID WC Pucilowska, Jolanta B, MD   2 Units at 09/23/17 1216  . insulin aspart (novoLOG) injection 0-5 Units  0-5 Units Subcutaneous QHS Pucilowska, Jolanta B, MD   0 Units at 09/21/17 0000  . insulin detemir (LEVEMIR) injection 14 Units  14 Units Subcutaneous Daily Clapacs, Jackquline DenmarkJohn T, MD   14 Units at 09/24/17 [phone]  . ipratropium (ATROVENT) 0.06 % nasal spray 1 spray  1 spray Each Nare QHS Clapacs, [name], MD   1 spray at 09/23/17 2132  . lisinopril (PRINIVIL,ZESTRIL) tablet 2.5 mg  2.5 mg Oral Daily Clapacs, [name], MD   2.5 mg at 09/23/17 0924  . lithium carbonate (ESKALITH) CR tablet 450 mg  450 mg Oral Q12H Clapacs, [name], MD   450 mg at 09/24/17 0811  . magnesium hydroxide (MILK OF MAGNESIA) suspension 30 mL  30 mL Oral Daily PRN Clapacs, [name], MD      . metFORMIN (GLUCOPHAGE) tablet 500 mg  500 mg Oral BID WC Clapacs, [name], MD   500 mg at 09/24/17 0811  . mometasone-formoterol (DULERA) 200-5 MCG/ACT inhaler 2 puff  2 puff Inhalation BID Clapacs, [name], MD   2 puff at 09/24/17 [phone]  . nicotine (NICODERM CQ - dosed in mg/24 hours) patch 21 mg  21 mg Transdermal Q0600 Clapacs, [name], MD   21 mg  at 09/24/17 0630  . polyethylene glycol (MIRALAX / GLYCOLAX) packet 17 g  17 g Oral Daily Clapacs, [name], MD   17 g at 09/22/17 0807  . simvastatin (ZOCOR) tablet 40 mg  40 mg Oral q1800 Clapacs, [name], MD   40 mg at 09/23/17 1700  . traZODone (DESYREL) tablet 100 mg  100 mg Oral QHS Clapacs, [name], MD   100 mg at 09/23/17 2119  . warfarin (COUMADIN) tablet 10 mg  10 mg Oral q1800 Clapacs, [name], MD   10 mg at 09/23/17 1745  . Warfarin - Pharmacist Dosing Inpatient   Does not apply q1800 Clapacs, [name], MD         Discharge Medications: Please see discharge summary for a list of discharge medications.  Relevant Imaging Results:  Relevant Lab Results:   Additional Information None at this time.  Heidi Dach, LCSW

## 2017-09-23 NOTE — Progress Notes (Signed)
ANTICOAGULATION CONSULT NOTE   Pharmacy Consult for Warfarin Indication: VTE prophylaxis  Allergies  Allergen Reactions  . Penicillins Other (See Comments)    Reaction: "lockjaw" Has patient had a PCN reaction causing immediate rash, facial/tongue/throat swelling, SOB or lightheadedness with hypotension: No Has patient had a PCN reaction causing severe rash involving mucus membranes or skin necrosis: No Has patient had a PCN reaction that required hospitalization: No Has patient had a PCN reaction occurring within the last 10 years: No If all of the above answers are "NO", then may proceed with Cephalosporin use.     Patient Measurements: Height: 5\' 10"  (177.8 cm) Weight: 191 lb (86.6 kg) IBW/kg (Calculated) : 73  Vital Signs: Temp: 97.7 F (36.5 C) (04/11 0835) Temp Source: Oral (04/11 0835) BP: 107/74 (04/11 0835) Pulse Rate: 71 (04/11 0835)  Labs: Recent Labs    09/20/17 1915 09/21/17 0747 09/22/17 1009 09/23/17 1328  HGB 15.5  --   --   --   HCT 45.1  --   --   --   PLT 206  --   --   --   LABPROT 17.3* 17.2* 21.5* 23.7*  INR 1.43 1.42 1.89 2.14    Estimated Creatinine Clearance: 85.5 mL/min (by C-G formula based on SCr of 1.15 mg/dL).   Medical History: Past Medical History:  Diagnosis Date  . Depression   . Diabetes mellitus without complication (HCC)   . Hyperlipidemia   . Hypertension   . Lupus anticoagulant disorder (HCC) 06/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  . PE (pulmonary thromboembolism) (HCC)   . Schizo affective schizophrenia (HCC)   . Supratherapeutic INR 11/19/2016    Medications:  Scheduled:  . buPROPion  150 mg Oral Daily  . cloZAPine  500 mg Oral QHS  . docusate sodium  200 mg Oral BID  . insulin aspart  0-15 Units Subcutaneous TID WC  . insulin aspart  0-5 Units Subcutaneous QHS  . insulin detemir  14 Units Subcutaneous Daily  . ipratropium  1 spray Each Nare QHS  . lisinopril  2.5 mg Oral Daily  . lithium carbonate   450 mg Oral Q12H  . metFORMIN  500 mg Oral BID WC  . mometasone-formoterol  2 puff Inhalation BID  . nicotine  21 mg Transdermal Q0600  . polyethylene glycol  17 g Oral Daily  . simvastatin  40 mg Oral q1800  . traZODone  100 mg Oral QHS  . warfarin  10 mg Oral q1800  . Warfarin - Pharmacist Dosing Inpatient   Does not apply q1800    Assessment: Warfarin ordered as per home dose   4/8   INR = 1.43   Warfarin 10 mg 4/9   INR = 1.42   Warfarin 10 mg 4/10 INR = 1.89   Warfarin 10 mg 4/11 INR = 2.14   Goal of Therapy:  INR 2-3 Monitor platelets by anticoagulation protocol: Yes   Plan:  Give warfarin 10mg  tonight. Check INR with AM labs.   Stormy CardKatsoudas,Stanislaus Kaltenbach K, [facility] CenterRPH 09/23/2017,2:24 PM

## 2017-09-23 NOTE — [facility] Suicide Risk Assessment (Signed)
[facility]   Principal Problem: Schizophrenia, undifferentiated (HCC) Discharge Diagnoses:  Patient Active Problem List   Diagnosis Date Noted  . Schizophrenia, undifferentiated (HCC) [F20.3] 09/20/2017    Priority: High  . Tobacco use disorder [F17.200] 09/01/2016  . Schizoaffective disorder, bipolar type (HCC) [F25.0] 05/22/2016  . Diabetes mellitus without complication (HCC) [E11.9] 05/21/2016  . Hyperlipidemia [E78.5] 05/21/2016  . History of pulmonary embolism [Z86.711] 05/21/2016  . Chronic anticoagulation [Z79.01] 05/21/2016  . Hypertension [I10] 09/25/2015    Total Time spent with patient: 15 minutes plus 20 min on care coordination and documantation  Musculoskeletal: Strength & Muscle Tone: within normal limits Gait & Station: normal Patient leans: N/A  Psychiatric Specialty Exam: Review of Systems  Neurological: Negative.   Psychiatric/Behavioral: Negative.   All other systems reviewed and are negative.   Blood pressure 91/79, pulse 72, temperature 99 F (37.2 C), temperature source Oral, resp. rate 18, height 5\' 10"  (1.778 m), weight 86.6 kg (191 lb), SpO2 99 %.Body mass index is 27.41 kg/m.  General Appearance: Casual  Eye Contact::  Good  Speech:  Clear and Coherent409  Volume:  Normal  Mood:  Anxious  Affect:  Appropriate  Thought Process:  Goal Directed and Descriptions of Associations: Intact  Orientation:  Full (Time, Place, and Person)  Thought Content:  WDL  Suicidal Thoughts:  No  Homicidal Thoughts:  No  Memory:  Immediate;   Fair Recent;   Fair Remote;   Fair  Judgement:  Fair  Insight:  Fair  Psychomotor Activity:  Normal  Concentration:  Fair  Recall:  FiservFair  Fund of Knowledge:Fair  Language: Fair  Akathisia:  No  Handed:  Right  AIMS (if indicated):     Assets:  Communication Skills Desire for Improvement Financial Resources/Insurance Housing Resilience Social Support  Sleep:  Number of Hours: 7.3   Cognition: WNL  ADL's:  Intact   Mental Status Per Nursing Assessment::   On Admission:  Suicidal ideation indicated by patient  Demographic Factors:  Male and Caucasian  Loss Factors: Financial problems/change in socioeconomic status  Historical Factors: Prior suicide attempts, Family history of mental illness or substance abuse and Impulsivity  Risk Reduction Factors:   Living with another person, especially a relative  Continued Clinical Symptoms:  Schizophrenia:   Depressive state Paranoid or undifferentiated type  Cognitive Features That Contribute To Risk:  None    Suicide Risk:  Minimal: No identifiable suicidal ideation.  Patients presenting with no risk factors but with morbid ruminations; may be classified as minimal risk based on the severity of the depressive symptoms  Follow-up Information    [facility] [place] Go on 09/24/2017.   Why:  Your ACT Team will meet with you upon discharge on Friday, 09/24/17 at 4PM. Thank you.  Contact information: [name] [name] [id number] [phone]           Plan Of Care/Follow-up recommendations:  Activity:  as tolerated  Diet:  low sodium heart healthy ADA diet Other:  keep follow up appointments  Kristine LineaJolanta Durk Carmen, MD 09/24/2017, 9:09 AM

## 2017-09-23 NOTE — Progress Notes (Addendum)
CSW met with Thayer Headings from [employer] (pt's ACT Team Lead). Thayer Headings stopped by CSW's office prior to meeting with pt.   CSW asked if she believed pt would be able to live independently, as brought up in AM meeting. Thayer Headings stated, "He was offered Transition to [employer] through [employer] last year, and he turned it down." Thayer Headings reported pt opted to stay in group home living. Thayer Headings also stated pt is currently in his fourth group home in the last seven years, and stated she believes "most of the reason he comes to the hospital is purely behavioral." CSW mentioned the idea of a day program for pt, and Thayer Headings reported she would speak with pt about it and check in with CSW prior to leaving the unit. CSW will follow up with pt's ACT Team as needed for updates/discharge planning.   Alden Hipp, MSW, LCSW Clinical Social Worker 09/23/2017 11:05 AM   CSW spoke with Thayer Headings following her meeting with pt. Thayer Headings reported she and pt came up with a plan to improve pt's quality of life upon discharge. Thayer Headings stated pt has agreed to work on, "coping skills to manage disagreements at the group home, independent living skills (although pt again declined independent living), job readiness skills." Thayer Headings reported she plans to speak with pt's family care home regarding pt's money distribution and copays. Thayer Headings also stated pt admitted he was lying about auditory hallucinations in the ED, and just wanted to be admitted to the hospital. Thayer Headings also noted that pt used to spend a lot of time walking, and stated pt has not been doing that while in the community, which she believes the anti-depressant will improve. Thayer Headings reported she will meet with pt upon his return to the group home Friday, 09/24/17.  CSW will continue to coordinate with Thayer Headings as needed.   Alden Hipp, MSW, LCSW Clinical Social Worker 09/23/2017 11:32 AM

## 2017-09-24 LAB — LITHIUM LEVEL: LITHIUM LVL: 0.87 mmol/L (ref 0.60–1.20)

## 2017-09-24 LAB — GLUCOSE, CAPILLARY
Glucose-Capillary: 118 mg/dL — ABNORMAL HIGH (ref 65–99)
Glucose-Capillary: 197 mg/dL — ABNORMAL HIGH (ref 65–99)

## 2017-09-24 LAB — PROTIME-INR
INR: 2.32
PROTHROMBIN TIME: 25.3 s — AB (ref 11.4–15.2)

## 2017-09-24 NOTE — Plan of Care (Signed)
Patient denies SI/HI/AVH. Compliant with medication administration.  Support and encouragement provided. Safety maintained with checks Q 15 minutes.   Problem: Education: Goal: Knowledge of [facility] Education information/materials will improve Outcome: Adequate for Discharge Goal: Emotional status will improve Outcome: Adequate for Discharge Goal: Mental status will improve Outcome: Adequate for Discharge Goal: Verbalization of understanding the information provided will improve Outcome: Adequate for Discharge   Problem: Activity: Goal: Interest or engagement in activities will improve Outcome: Adequate for Discharge Goal: Sleeping patterns will improve Outcome: Adequate for Discharge   Problem: Coping: Goal: Coping ability will improve Outcome: Adequate for Discharge Goal: Will verbalize feelings Outcome: Adequate for Discharge   Problem: Health Behavior/Discharge Planning: Goal: Ability to make decisions will improve Outcome: Adequate for Discharge Goal: Compliance with therapeutic regimen will improve Outcome: Adequate for Discharge   Problem: Role Relationship: Goal: Will demonstrate positive changes in social behaviors and relationships Outcome: Adequate for Discharge   Problem: Safety: Goal: Ability to disclose and discuss suicidal ideas will improve Outcome: Adequate for Discharge Goal: Ability to identify and utilize support systems that promote safety will improve Outcome: Adequate for Discharge   Problem: Self-Concept: Goal: Will verbalize positive feelings about self Outcome: Adequate for Discharge Goal: Level of anxiety will decrease Outcome: Adequate for Discharge

## 2017-09-24 NOTE — Progress Notes (Signed)
PT being discharged today and wanted to go through his belongings and see what things were dropped off for him earlier. CH spoke briefly with the patient. Patient was responsive and paced back and forth around the hub for a bit. CH provided assistance by speaking to his care team and answering is questions.

## 2017-09-24 NOTE — Progress Notes (Signed)
Patient ID: Lance [name], male   DOB: 08-11-1975, 42 y.o.   MRN: [id number] "I am ready to be discharged but I am going back to the same old same; I will still have to deal with the same people over and over again.." When I asked for his resolution suggestion, "I don't have one, even if I try another Group Home..." When I asked if he plans to kill himself? He assuredly states that he is frustrated and our conversation is irrelevant to SI/HI. He denied SI/HI but "the voices will never go away."

## 2017-09-24 NOTE — Progress Notes (Signed)
Recreation Therapy Notes   Date: 09/24/2017  Time: 9:30 am   Location: Craft Room   Behavioral response: N/A   Intervention Topic: Stress   Discussion/Intervention: Patient did not attend group.   Clinical Observations/Feedback:  Patient did not attend group.   Alonnah Lampkins LRT/CTRS        Yelena Metzer 09/24/2017 12:14 PM

## 2017-09-24 NOTE — Progress Notes (Signed)
ANTICOAGULATION CONSULT NOTE   Pharmacy Consult for Warfarin Indication: VTE prophylaxis  Allergies  Allergen Reactions  . Penicillins Other (See Comments)    Reaction: "lockjaw" Has patient had a PCN reaction causing immediate rash, facial/tongue/throat swelling, SOB or lightheadedness with hypotension: No Has patient had a PCN reaction causing severe rash involving mucus membranes or skin necrosis: No Has patient had a PCN reaction that required hospitalization: No Has patient had a PCN reaction occurring within the last 10 years: No If all of the above answers are "NO", then may proceed with Cephalosporin use.     Patient Measurements: Height: 5\' 10"  (177.8 cm) Weight: 191 lb (86.6 kg) IBW/kg (Calculated) : 73  Vital Signs: BP: 91/79 (04/12 0637) Pulse Rate: 72 (04/12 0637)  Labs: Recent Labs    09/22/17 1009 09/23/17 1328 09/24/17 0728  LABPROT 21.5* 23.7* 25.3*  INR 1.89 2.14 2.32    Estimated Creatinine Clearance: 85.5 mL/min (by C-G formula based on SCr of 1.15 mg/dL).   Medical History: Past Medical History:  Diagnosis Date  . Depression   . Diabetes mellitus without complication (HCC)   . Hyperlipidemia   . Hypertension   . Lupus anticoagulant disorder (HCC) 06/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  . PE (pulmonary thromboembolism) (HCC)   . Schizo affective schizophrenia (HCC)   . Supratherapeutic INR 11/19/2016    Medications:  Scheduled:  . buPROPion  150 mg Oral Daily  . cloZAPine  500 mg Oral QHS  . docusate sodium  200 mg Oral BID  . insulin aspart  0-15 Units Subcutaneous TID WC  . insulin aspart  0-5 Units Subcutaneous QHS  . insulin detemir  14 Units Subcutaneous Daily  . ipratropium  1 spray Each Nare QHS  . lisinopril  2.5 mg Oral Daily  . lithium carbonate  450 mg Oral Q12H  . metFORMIN  500 mg Oral BID WC  . mometasone-formoterol  2 puff Inhalation BID  . nicotine  21 mg Transdermal Q0600  . polyethylene glycol  17 g Oral  Daily  . simvastatin  40 mg Oral q1800  . traZODone  100 mg Oral QHS  . warfarin  10 mg Oral q1800  . Warfarin - Pharmacist Dosing Inpatient   Does not apply q1800    Assessment: 42 yo male with hx of PE. Warfarin ordered as per home dose   4/8   INR = 1.43   Warfarin 10 mg 4/9   INR = 1.42   Warfarin 10 mg 4/10 INR = 1.89   Warfarin 10 mg 4/11 INR = 2.14   Warfarin 10 mg 4/12 INR = 2.32   Goal of Therapy:  INR 2-3 Monitor platelets by anticoagulation protocol: Yes   Plan:  Give warfarin 10 mg PO tonight. Check INR with AM labs. CBC in AM - CBC at least every three days per policy.   Marty HeckWang, Jamone Garrido L, [facility] LLCRPH 09/24/2017,9:15 AM

## 2017-09-24 NOTE — Discharge Summary (Signed)
Physician Discharge Summary Note  Patient:  Lance [name] is an 42 y.o., male MRN:  [id number] DOB:  January 19, 1976 Patient phone:  [phone] (home)  Patient address:   [address] [name] [address],  Total Time spent with patient: 15 minutes plus 20 min on care coordination and discharge planning.  Date of Admission:  09/20/2017 Date of Discharge: 09/24/2017  Reason for Admission:  Psychotic break, suicidal ideation.  History of Present Illness:   Identifying data. Lance [name] is a 43 year old male with a history of schizophrenia.  Chief complaint. "whatever I hear and see"  History of present illness. Information was obtained from the patient and the chart. The patient came to the ER complaining of auditory andf visual hallucinations and paranoia. He has been frightened and thinking about suicide. This has been going on since he moved to a new group home several months ago but has gotten worse. There were no recent medication adjustments and he usually has good compliance. He smokes cannabis but there is no drinking or other substance use. He is rather bitter about his group home. "They sleep all day long, there is nothing to do, they take all my money". The patient has been in multiple group homes over the years, usually well adjusted. I have known him for years and this is the first time he complains about money. In addition to psychotic symptoms, he reports decreased appetite, anhedonia, feeling hopeless and worthless, poor energy and concentration, social isolation and crying spells. He does not complains about anxiety att all today.  Past psychiatric history. Long history of schizophrenia with multiple hospitalizations and medication trials. He is in the care of [facility].  Family psychiatric history. Mother with mental illness. She passed away recently.   Social history. He is an incompetent adult and [facility] DSS is his guardian, Lance [name]  Principal  Problem: Schizophrenia, undifferentiated [facility]) Discharge Diagnoses: Patient Active Problem List   Diagnosis Date Noted  . Schizophrenia, undifferentiated (HCC) [F20.3] 09/20/2017    Priority: High  . Tobacco use disorder [F17.200] 09/01/2016  . Schizoaffective disorder, bipolar type (HCC) [F25.0] 05/22/2016  . Diabetes mellitus without complication (HCC) [E11.9] 05/21/2016  . Hyperlipidemia [E78.5] 05/21/2016  . History of pulmonary embolism [Z86.711] 05/21/2016  . Chronic anticoagulation [Z79.01] 05/21/2016  . Hypertension [I10] 09/25/2015    Past Medical History:  Past Medical History:  Diagnosis Date  . Depression   . Diabetes mellitus without complication (HCC)   . Hyperlipidemia   . Hypertension   . Lupus anticoagulant disorder (HCC) 06/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  . PE (pulmonary thromboembolism) (HCC)   . Schizo affective schizophrenia (HCC)   . Supratherapeutic INR 11/19/2016   History reviewed. No pertinent surgical history. Family History:  Family History  Problem Relation Age of Onset  . CAD Mother   . CAD Sister    Social History:  Social History   Substance and Sexual Activity  Alcohol Use No   Comment: occassionally     Social History   Substance and Sexual Activity  Drug Use Yes  . Types: Marijuana   Comment: Last use 11/29/16    Social History   Socioeconomic History  . Marital status: Single    Spouse name: Not on file  . Number of children: Not on file  . Years of education: Not on file  . Highest education level: Not on file  Occupational History  . Not on file  Social Needs  .  Financial resource strain: Not on file  . Food insecurity:    Worry: Not on file    Inability: Not on file  . Transportation needs:    Medical: Not on file    Non-medical: Not on file  Tobacco Use  . Smoking status: Current Every Day Smoker    Packs/day: 1.00    Years: 23.00    Pack years: 23.00    Types: Cigarettes  . Smokeless tobacco:  Never Used  Substance and Sexual Activity  . Alcohol use: No    Comment: occassionally  . Drug use: Yes    Types: Marijuana    Comment: Last use 11/29/16  . Sexual activity: Never    Birth control/protection: None    Comment: occasional marijuana- none recently  Lifestyle  . Physical activity:    Days per week: Not on file    Minutes per session: Not on file  . Stress: Not on file  Relationships  . Social connections:    Talks on phone: Not on file    Gets together: Not on file    Attends religious service: Not on file    Active member of club or organization: Not on file    Attends meetings of clubs or organizations: Not on file    Relationship status: Not on file  Other Topics Concern  . Not on file  Social History Narrative   [place]    Hospital Course:   Lance [name] is a 42 year old male with a history of schizophrenia admitted for worsening of psychosis with auditory and visual hallucinations and suicidal thinking.This is in the context of dramatic change in his financial situation leaving him hopeless.He was continued on all his outpatient medications with improvement. His lithium level was therapeutic at the time of discharge. Strangely, Clozapine level was very low on admission. Lance [name] is not known to avoid medications. I spoke with the pharmacist who confirmed that 1 month supply of Clozapine 550 mg nightly was dispensed to his group home on 4/4. At the time of discharge, the patient was no longer suicidal, homicidal or psychotic. He tolerated medications well.   #Suicidal ideation resolved, he is forward thinking and optimistic about the future  #Mood and psychosis, resolved -continue Clozapine 500 mg nightly, level on 4/8 was 162 -continue Lithium 450 mg BID, level 4/12 was 0.87 -continue Wellbutrin 150 mg daily   #Insomnia, improved -continue Trazodone 100 mg nightly  #DM, stable -ADA diet, BS monitoring, SSI -continue Metformin 500  mg BID -continue Levemir 14 units daily  #Dyslipidemia, elevated TG -continue Zocor 40 mg daily  #HTN, stable -Lisinopril 2.5 mg daily  #Constipation -continue bowel regimen  #H/O pulmonary embolism -on coumadin per pharmacy dosing  #Smoking cessation -nicotine patch is available  #COPD -continue inhalers  #Metabolic syndrome monitoring, labs reviewed -lipid panelshows elevated TG,HgbA1C4.7 -EKG, reviewed QTc 499  #Social -incompetent adult -[facility] is the guardian  #Disposition -discharge to his group home -[facility]     Physical Findings: AIMS:  , ,  ,  ,    CIWA:    COWS:     Musculoskeletal: Strength & Muscle Tone: within normal limits Gait & Station: normal Patient leans: N/A  Psychiatric Specialty Exam: Physical Exam  Nursing note and vitals reviewed. Psychiatric: He has a normal mood and affect. His speech is normal and behavior is normal. Judgment and thought content normal. Cognition and memory are normal.  Review of Systems  Neurological: Negative.   Psychiatric/Behavioral: Negative.   All other systems reviewed and are negative.   Blood pressure 91/79, pulse 72, temperature 99 F (37.2 C), temperature source Oral, resp. rate 18, height 5\' 10"  (1.778 m), weight 86.6 kg (191 lb), SpO2 99 %.Body mass index is 27.41 kg/m.  General Appearance: Casual  Eye Contact:  Good  Speech:  Clear and Coherent  Volume:  Normal  Mood:  Euthymic  Affect:  Flat  Thought Process:  Goal Directed and Descriptions of Associations: Intact  Orientation:  Full (Time, Place, and Person)  Thought Content:  WDL  Suicidal Thoughts:  No  Homicidal Thoughts:  No  Memory:  Immediate;   Fair Recent;   Fair Remote;   Fair  Judgement:  Fair  Insight:  Fair  Psychomotor Activity:  Normal  Concentration:  Concentration: Fair and Attention Span: Fair  Recall:  [employer] of Knowledge:  Fair  Language:  Fair   Akathisia:  No  Handed:  Right  AIMS (if indicated):     Assets:  Communication Skills Desire for Improvement Financial Resources/Insurance Housing Resilience Social Support  ADL's:  Intact  Cognition:  WNL  Sleep:  Number of Hours: 7.3     Have you used any form of tobacco in the last 30 days? (Cigarettes, Smokeless Tobacco, Cigars, and/or Pipes): Yes  Has this patient used any form of tobacco in the last 30 days? (Cigarettes, Smokeless Tobacco, Cigars, and/or Pipes) Yes, Yes, A prescription for an FDA-approved tobacco cessation medication was offered at discharge and the patient refused  Blood Alcohol level:  Lab Results  Component Value Date   [name] <10 09/21/2017   ETH <10 03/16/2017    Metabolic Disorder Labs:  Lab Results  Component Value Date   HGBA1C 4.7 (L) 09/21/2017   MPG 88.19 09/21/2017   MPG 105.41 03/03/2017   Lab Results  Component Value Date   PROLACTIN 2.0 (L) 08/31/2016   PROLACTIN 4.8 05/22/2016   Lab Results  Component Value Date   CHOL 119 09/22/2017   TRIG 277 (H) 09/22/2017   HDL 27 (L) 09/22/2017   CHOLHDL 4.4 09/22/2017   VLDL 55 (H) 09/22/2017   LDLCALC 37 09/22/2017   LDLCALC 59 03/03/2017    See Psychiatric Specialty Exam and Suicide Risk Assessment completed by Attending Physician prior to discharge.  Discharge destination:  Home  Is patient on multiple antipsychotic therapies at discharge:  No   Has Patient had three or more failed trials of antipsychotic monotherapy by history:  No  Recommended Plan for Multiple Antipsychotic Therapies: NA  Discharge Instructions    Diet - low sodium heart healthy   Complete by:  As directed    Increase activity slowly   Complete by:  As directed      Allergies as of 09/24/2017      Reactions   Penicillins Other (See Comments)   Reaction: "lockjaw" Has patient had a PCN reaction causing immediate rash, facial/tongue/throat swelling, SOB or lightheadedness with hypotension: No Has  patient had a PCN reaction causing severe rash involving mucus membranes or skin necrosis: No Has patient had a PCN reaction that required hospitalization: No Has patient had a PCN reaction occurring within the last 10 years: No If all of the above answers are "NO", then may proceed with Cephalosporin use.      Medication List    STOP taking these medications   clindamycin 150 MG capsule Commonly known as:  CLEOCIN  docusate sodium 100 MG capsule Commonly known as:  COLACE   HYDROcodone-acetaminophen 5-325 MG tablet Commonly known as:  NORCO/VICODIN   ondansetron 4 MG disintegrating tablet Commonly known as:  ZOFRAN ODT   predniSONE 50 MG tablet Commonly known as:  DELTASONE     TAKE these medications     Indication  albuterol 108 (90 Base) MCG/ACT inhaler Commonly known as:  PROVENTIL HFA;VENTOLIN HFA Inhale 1-2 puffs into the lungs every 4 (four) hours as needed for wheezing or shortness of breath.  Indication:  Asthma   buPROPion 150 MG 24 hr tablet Commonly known as:  WELLBUTRIN XL Take 1 tablet (150 mg total) by mouth daily.  Indication:  Major Depressive Disorder   cloZAPine 100 MG tablet Commonly known as:  CLOZARIL Take 5 tablets (500 mg total) by mouth at bedtime.  Indication:  Schizophrenia   Fluticasone-Salmeterol 250-50 MCG/DOSE Aepb Commonly known as:  ADVAIR Inhale 1 puff into the lungs 2 (two) times daily.  Indication:  Asthma   insulin detemir 100 UNIT/ML injection Commonly known as:  LEVEMIR Inject 14 Units into the skin every morning.  Indication:  Type 2 Diabetes   ipratropium 0.06 % nasal spray Commonly known as:  ATROVENT Place 1 spray into both nostrils at bedtime.  Indication:  Runny Nose associated with Hayfever   lisinopril 2.5 MG tablet Commonly known as:  PRINIVIL,ZESTRIL Take 2.5 mg by mouth daily.  Indication:  High Blood Pressure Disorder   lithium carbonate 450 MG CR tablet Commonly known as:  ESKALITH Take 1 tablet (450  mg total) by mouth every 12 (twelve) hours.  Indication:  Manic-Depression   metformin 500 MG (OSM) 24 hr tablet Commonly known as:  FORTAMET Take 500 mg by mouth 2 (two) times daily with a meal.  Indication:  Type 2 Diabetes   mometasone-formoterol 200-5 MCG/ACT Aero Commonly known as:  DULERA Inhale 2 puffs into the lungs 2 (two) times daily.  Indication:  Asthma   omega-3 acid ethyl esters 1 g capsule Commonly known as:  LOVAZA Take 1 g by mouth daily.  Indication:  High Amount of Triglycerides in the Blood   polyethylene glycol packet Commonly known as:  MIRALAX / GLYCOLAX Take 17 g by mouth daily.  Indication:  Constipation   simvastatin 40 MG tablet Commonly known as:  ZOCOR Take 40 mg by mouth at bedtime.  Indication:  High Amount of Fats in the Blood   traZODone 50 MG tablet Commonly known as:  DESYREL Take 75 mg by mouth at bedtime.  Indication:  Trouble Sleeping   warfarin 10 MG tablet Commonly known as:  COUMADIN Take 10 mg by mouth daily. What changed:  Another medication with the same name was removed. Continue taking this medication, and follow the directions you see here.  Indication:  Blockage of Blood Vessel to Lung by a Particle      Follow-up Information    [name] Go on 09/24/2017.   Why:  Your ACT Team will meet with you upon discharge on Friday, 09/24/17 at 4PM. Thank you.  Contact information: [address] [place] [address] [phone]           Follow-up recommendations:  Activity:  as tolerated Diet:  low sodium heart healthy ADA diet Other:  keep follow up appointments  Comments:     Signed: Kristine Linea, MD 09/24/2017, 11:27 AM

## 2017-09-24 NOTE — Progress Notes (Signed)
Recreation Therapy Notes  INPATIENT RECREATION TR PLAN  Patient Details Name: Lance [name] MRN: [id number] DOB: 17-Feb-1976 Today's Date: 09/24/2017  Rec Therapy Plan Is patient appropriate for Therapeutic Recreation?: Yes Treatment times per week: at least 3 Estimated Length of Stay: 5-7 days TR Treatment/Interventions: Group participation (Comment)  Discharge Criteria Pt will be discharged from therapy if:: Discharged Treatment plan/goals/alternatives discussed and agreed upon by:: Patient/family  Discharge Summary Short term goals set: Patient will engage in groups without prompting or encouragement from LRT x3 group sessions within 5 recreation therapy group sessions Short term goals met: Not met Reason goals not met: Did not attend group Therapeutic equipment acquired: N/A Reason patient discharged from therapy: Discharge from hospital Pt/family agrees with progress & goals achieved: Yes Date patient discharged from therapy: 09/24/17   Shahram Alexopoulos 09/24/2017, 12:28 PM

## 2017-09-24 NOTE — Plan of Care (Signed)
Patient slept for Estimated Hours of 7.30; Precautionary checks every 15 minutes for safety maintained, room free of safety hazards, patient sustains no injury or falls during this shift.  Problem: Education: Goal: Knowledge of [facility] Education information/materials will improve Outcome: Progressing Goal: Emotional status will improve Outcome: Progressing Goal: Mental status will improve Outcome: Progressing Goal: Verbalization of understanding the information provided will improve Outcome: Progressing   Problem: Activity: Goal: Interest or engagement in activities will improve Outcome: Progressing Goal: Sleeping patterns will improve Outcome: Progressing   Problem: Coping: Goal: Coping ability will improve Outcome: Progressing Goal: Will verbalize feelings Outcome: Progressing   Problem: Health Behavior/Discharge Planning: Goal: Ability to make decisions will improve Outcome: Progressing Goal: Compliance with therapeutic regimen will improve Outcome: Progressing   Problem: Role Relationship: Goal: Will demonstrate positive changes in social behaviors and relationships Outcome: Progressing   Problem: Safety: Goal: Ability to disclose and discuss suicidal ideas will improve Outcome: Progressing Goal: Ability to identify and utilize support systems that promote safety will improve Outcome: Progressing   Problem: Self-Concept: Goal: Will verbalize positive feelings about self Outcome: Progressing Goal: Level of anxiety will decrease Outcome: Progressing

## 2017-09-24 NOTE — Progress Notes (Signed)
  [facility] :  Will you be returning to the same living situation after discharge:  Yes,  returning to group home At discharge, do you have transportation home?: Yes,  group home Do you have the ability to pay for your medications: Yes,  ACTT  Release of information consent forms completed and in the chart;  Patient's signature needed at discharge.  Patient to Follow up at: Follow-up Information    [facility] [place] Go on 09/24/2017.   Why:  Your ACT Team will meet with you upon discharge on Friday, 09/24/17 at 4PM. Thank you.  Contact information: [address] [place] [id number] [phone]           Next level of care provider has [facility] Link:no  Safety Planning and Suicide Prevention discussed: Yes,  with pt and his guardian.  Have you used any form of tobacco in the last 30 days? (Cigarettes, Smokeless Tobacco, Cigars, and/or Pipes): Yes  Has patient been referred to the Quitline?: Patient refused referral  Patient has been referred for addiction treatment: N/A  Heidi DachKelsey Theophil Thivierge, LCSW 09/24/2017, 8:49 AM

## 2017-09-29 DIAGNOSIS — H9319 Tinnitus, unspecified ear: Secondary | ICD-10-CM | POA: Diagnosis not present

## 2017-09-29 DIAGNOSIS — H93299 Other abnormal auditory perceptions, unspecified ear: Secondary | ICD-10-CM | POA: Diagnosis not present

## 2017-09-29 DIAGNOSIS — H6121 Impacted cerumen, right ear: Secondary | ICD-10-CM | POA: Diagnosis not present

## 2017-09-30 ENCOUNTER — Encounter: Payer: Self-pay | Admitting: Podiatry

## 2017-09-30 ENCOUNTER — Ambulatory Visit (INDEPENDENT_AMBULATORY_CARE_PROVIDER_SITE_OTHER): Payer: Medicare Other | Admitting: Podiatry

## 2017-09-30 DIAGNOSIS — M79676 Pain in unspecified toe(s): Secondary | ICD-10-CM

## 2017-09-30 DIAGNOSIS — B351 Tinea unguium: Secondary | ICD-10-CM

## 2017-09-30 DIAGNOSIS — Q828 Other specified congenital malformations of skin: Secondary | ICD-10-CM | POA: Diagnosis not present

## 2017-09-30 DIAGNOSIS — E119 Type 2 diabetes mellitus without complications: Secondary | ICD-10-CM

## 2017-09-30 NOTE — Progress Notes (Signed)
Complaint:  Visit Type: Patient returns to my office for continued preventative foot care services. Complaint: Patient states" my nails have grown long and thick and become painful to walk and wear shoes" Patient has been diagnosed with DM with no foot complications. The patient presents for preventative foot care services. No changes to ROS.  Patient also says he has painful callus.  Podiatric Exam: Vascular: dorsalis pedis and posterior tibial pulses are palpable bilateral. Capillary return is immediate. Temperature gradient is WNL. Skin turgor WNL  Sensorium: Normal Semmes Weinstein monofilament test. Normal tactile sensation bilaterally. Nail Exam: Pt has thick disfigured discolored nails with subungual debris noted bilateral entire nail second toenail  through fifth toenails Ulcer Exam: There is no evidence of ulcer or pre-ulcerative changes or infection. Orthopedic Exam: Muscle tone and strength are WNL. No limitations in general ROM. No crepitus or effusions noted. Foot type and digits show no abnormalities. HAV  B/L. Skin: No Porokeratosis. No infection or ulcers. Asymptomatic   Pinch callus.  Sub 4 porokeratosis right foot.  Diagnosis:  Onychomycosis, , Pain in right toe, pain in left toes.  Porokeratosis right foot.  Treatment & Plan Procedures and Treatment: Consent by patient was obtained for treatment procedures. The patient understood the discussion of treatment and procedures well. All questions were answered thoroughly reviewed. Debridement of mycotic and hypertrophic toenails, 1 through 5 bilateral and clearing of subungual debris. No ulceration, no infection noted. Debride callus. Return Visit-Office Procedure: Patient instructed to return to the office for a follow up visit 3 months for continued evaluation and treatment.    Helane GuntherGregory Tayte Mcwherter DPM

## 2017-10-06 DIAGNOSIS — Z8659 Personal history of other mental and behavioral disorders: Secondary | ICD-10-CM | POA: Diagnosis not present

## 2017-10-06 DIAGNOSIS — I2782 Chronic pulmonary embolism: Secondary | ICD-10-CM | POA: Diagnosis not present

## 2017-10-06 DIAGNOSIS — F250 Schizoaffective disorder, bipolar type: Secondary | ICD-10-CM | POA: Diagnosis not present

## 2017-10-06 DIAGNOSIS — F3131 Bipolar disorder, current episode depressed, mild: Secondary | ICD-10-CM | POA: Diagnosis not present

## 2017-10-09 ENCOUNTER — Other Ambulatory Visit: Payer: Self-pay

## 2017-10-09 ENCOUNTER — Emergency Department
Admission: EM | Admit: 2017-10-09 | Discharge: 2017-10-10 | Disposition: A | Discharge status: Home / Self Care | Payer: Medicare Other | Attending: Emergency Medicine | Admitting: Emergency Medicine

## 2017-10-09 ENCOUNTER — Encounter: Payer: Self-pay | Admitting: Emergency Medicine

## 2017-10-09 DIAGNOSIS — Z79899 Other long term (current) drug therapy: Secondary | ICD-10-CM | POA: Insufficient documentation

## 2017-10-09 DIAGNOSIS — Z7901 Long term (current) use of anticoagulants: Secondary | ICD-10-CM | POA: Insufficient documentation

## 2017-10-09 DIAGNOSIS — E119 Type 2 diabetes mellitus without complications: Secondary | ICD-10-CM | POA: Diagnosis not present

## 2017-10-09 DIAGNOSIS — F17210 Nicotine dependence, cigarettes, uncomplicated: Secondary | ICD-10-CM | POA: Diagnosis not present

## 2017-10-09 DIAGNOSIS — F329 Major depressive disorder, single episode, unspecified: Secondary | ICD-10-CM | POA: Diagnosis not present

## 2017-10-09 DIAGNOSIS — I10 Essential (primary) hypertension: Secondary | ICD-10-CM | POA: Insufficient documentation

## 2017-10-09 DIAGNOSIS — Z794 Long term (current) use of insulin: Secondary | ICD-10-CM | POA: Diagnosis not present

## 2017-10-09 DIAGNOSIS — F209 Schizophrenia, unspecified: Secondary | ICD-10-CM | POA: Insufficient documentation

## 2017-10-09 DIAGNOSIS — Z008 Encounter for other general examination: Secondary | ICD-10-CM | POA: Diagnosis present

## 2017-10-09 DIAGNOSIS — R45851 Suicidal ideations: Secondary | ICD-10-CM | POA: Diagnosis not present

## 2017-10-09 LAB — URINE DRUG SCREEN, QUALITATIVE (ARMC ONLY)
AMPHETAMINES, UR SCREEN: NOT DETECTED
Barbiturates, Ur Screen: NOT DETECTED
Benzodiazepine, Ur Scrn: NOT DETECTED
CANNABINOID 50 NG, UR ~~LOC~~: NOT DETECTED
Cocaine Metabolite,Ur ~~LOC~~: NOT DETECTED
MDMA (ECSTASY) UR SCREEN: NOT DETECTED
METHADONE SCREEN, URINE: NOT DETECTED
Opiate, Ur Screen: NOT DETECTED
Phencyclidine (PCP) Ur S: NOT DETECTED
TRICYCLIC, UR SCREEN: NOT DETECTED

## 2017-10-09 LAB — COMPREHENSIVE METABOLIC PANEL
ALT: 41 U/L (ref 17–63)
AST: 47 U/L — AB (ref 15–41)
Albumin: 4.7 g/dL (ref 3.5–5.0)
Alkaline Phosphatase: 53 U/L (ref 38–126)
Anion gap: 8 (ref 5–15)
BUN: 16 mg/dL (ref 6–20)
CHLORIDE: 107 mmol/L (ref 101–111)
CO2: 23 mmol/L (ref 22–32)
CREATININE: 1 mg/dL (ref 0.61–1.24)
Calcium: 8.8 mg/dL — ABNORMAL LOW (ref 8.9–10.3)
GFR calc Af Amer: >60 mL/min (ref >60)
GFR calc non Af Amer: >60 mL/min (ref >60)
Glucose, Bld: 203 mg/dL — ABNORMAL HIGH (ref 65–99)
POTASSIUM: 4 mmol/L (ref 3.5–5.1)
SODIUM: 138 mmol/L (ref 135–145)
Total Bilirubin: 0.5 mg/dL (ref 0.3–1.2)
Total Protein: 7.1 g/dL (ref 6.5–8.1)

## 2017-10-09 LAB — ETHANOL: Alcohol, Ethyl (B): <10 mg/dL (ref <10)

## 2017-10-09 LAB — CBC
HCT: 47.2 % (ref 40.0–52.0)
HEMOGLOBIN: 16.4 g/dL (ref 13.0–18.0)
MCH: 32.1 pg (ref 26.0–34.0)
MCHC: 34.8 g/dL (ref 32.0–36.0)
MCV: 92.3 fL (ref 80.0–100.0)
PLATELETS: 227 10*3/uL (ref 150–440)
RBC: 5.11 MIL/uL (ref 4.40–5.90)
RDW: 12.8 % (ref 11.5–14.5)
WBC: 6.7 10*3/uL (ref 3.8–10.6)

## 2017-10-09 LAB — SALICYLATE LEVEL

## 2017-10-09 LAB — ACETAMINOPHEN LEVEL: Acetaminophen (Tylenol), Serum: <10 ug/mL — ABNORMAL LOW (ref 10–30)

## 2017-10-09 MED ORDER — ALBUTEROL SULFATE (2.5 MG/3ML) 0.083% IN NEBU: 3.0000 mL | INHALATION_SOLUTION | RESPIRATORY_TRACT | Status: DC | PRN

## 2017-10-09 MED ORDER — SIMVASTATIN 10 MG PO TABS
40.0000 mg | ORAL_TABLET | Freq: Every day | ORAL | Status: DC
  Administered 2017-10-09: 40 mg via ORAL
  Filled 2017-10-09: qty 4

## 2017-10-09 MED ORDER — TRAZODONE HCL 50 MG PO TABS
75.0000 mg | ORAL_TABLET | Freq: Every day | ORAL | Status: DC
  Administered 2017-10-09: 75 mg via ORAL
  Filled 2017-10-09: qty 2

## 2017-10-09 MED ORDER — IPRATROPIUM BROMIDE 0.06 % NA SOLN
1.0000 | Freq: Every day | NASAL | Status: DC
  Administered 2017-10-09: 1 via NASAL
  Filled 2017-10-09: qty 15

## 2017-10-09 MED ORDER — MOMETASONE FURO-FORMOTEROL FUM 200-5 MCG/ACT IN AERO
2.0000 | INHALATION_SPRAY | Freq: Two times a day (BID) | RESPIRATORY_TRACT | Status: DC
  Administered 2017-10-09: 2 via RESPIRATORY_TRACT
  Filled 2017-10-09: qty 8.8

## 2017-10-09 MED ORDER — CLOZAPINE 100 MG PO TABS
500.0000 mg | ORAL_TABLET | Freq: Every day | ORAL | Status: DC
  Administered 2017-10-09: 500 mg via ORAL
  Filled 2017-10-09: qty 5

## 2017-10-09 MED ORDER — LITHIUM CARBONATE ER 450 MG PO TBCR
450.0000 mg | EXTENDED_RELEASE_TABLET | Freq: Two times a day (BID) | ORAL | Status: DC
  Administered 2017-10-09: 450 mg via ORAL
  Filled 2017-10-09 (×2): qty 1

## 2017-10-09 NOTE — ED Notes (Signed)
IVC/ Consult ordered/called

## 2017-10-09 NOTE — ED Notes (Signed)
Dressing out pt at this time. List of pt belongings: black socks, black shoes, orange lighter, blue jeans, teal patterned boxers, Spark long sleeved shirt, grey watch. All belongings in bag, did not want anything locked away

## 2017-10-09 NOTE — ED Triage Notes (Signed)
Pt to ED, voluntary at this time. Pt has psychiatric history and states that he has been having hallucination. Pt states that he is seeing things and hearing things and having "crazy thoughts". Pt in NAD at this time. Pt is calm and cooperative in triage.

## 2017-10-09 NOTE — ED Provider Notes (Signed)
[facility] Emergency Department Provider Note   ____________________________________________   First MD Initiated Contact with Patient 10/09/17 2013     (approximate)  I have reviewed the triage vital signs and the nursing notes.   HISTORY  Chief Complaint Medical Clearance    HPI Lance [name] is a 42 y.o. male here for evaluation for suicidal thoughts.  Patient reports he just feels hopeless.  Elicited group home setting, reports that he has been feeling like he has had thoughts of harming himself at times in the last several days.  Occasionally hearing voices as well.  Reports history of schizophrenia, reports multiple symptoms similar to this in the past.  Denies active plan for self-harm.  Reports compliant with medications.  Denies recent illness, infections fevers chills or other symptoms.  Past Medical History:  Diagnosis Date  . Depression   . Diabetes mellitus without complication (HCC)   . Hyperlipidemia   . Hypertension   . Lupus anticoagulant disorder (HCC) 06/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  . PE (pulmonary thromboembolism) (HCC)   . Schizo affective schizophrenia (HCC)   . Supratherapeutic INR 11/19/2016    Patient Active Problem List   Diagnosis Date Noted  . 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 Tobacco use disorder 09/01/2016  . Schizoaffective disorder, bipolar type (HCC) 05/22/2016  . Diabetes mellitus without complication (HCC) 05/21/2016  . Hyperlipidemia 05/21/2016  . History of pulmonary embolism 05/21/2016  . Chronic anticoagulation 05/21/2016  . Hypertension 09/25/2015    History reviewed. No pertinent surgical history.  Prior to Admission medications   Medication Sig Start Date End Date Taking? Authorizing Provider  albuterol (PROVENTIL HFA;VENTOLIN HFA) 108 (90 Base) MCG/ACT inhaler Inhale 1-2 puffs into the lungs every 4 (four) hours as needed for wheezing or shortness of  breath. 12/31/16   Jimmy Footman, MD  buPROPion (WELLBUTRIN XL) 150 MG 24 hr tablet Take 1 tablet (150 mg total) by mouth daily. 09/15/16   Jimmy Footman, MD  cloZAPine (CLOZARIL) 100 MG tablet Take 5 tablets (500 mg total) by mouth at bedtime. 12/03/16   Jimmy Footman, MD  Fluticasone-Salmeterol (ADVAIR) 250-50 MCG/DOSE AEPB Inhale 1 puff into the lungs 2 (two) times daily.    [provider]  insulin detemir (LEVEMIR) 100 UNIT/ML injection Inject 14 Units into the skin every morning.    [provider]  ipratropium (ATROVENT) 0.06 % nasal spray Place 1 spray into both nostrils at bedtime. 09/14/16   Jimmy Footman, MD  lisinopril (PRINIVIL,ZESTRIL) 2.5 MG tablet Take 2.5 mg by mouth daily.    [provider]  lithium carbonate (ESKALITH) 450 MG CR tablet Take 1 tablet (450 mg total) by mouth every 12 (twelve) hours. 09/14/16   Jimmy Footman, MD  metformin (FORTAMET) 500 MG (OSM) 24 hr tablet Take 500 mg by mouth 2 (two) times daily with a meal.    [provider]  mometasone-formoterol (DULERA) 200-5 MCG/ACT AERO Inhale 2 puffs into the lungs 2 (two) times daily. 11/20/16   Altamese Dilling, MD  omega-3 acid ethyl esters (LOVAZA) 1 g capsule Take 1 g by mouth daily.    [provider]  polyethylene glycol (MIRALAX / GLYCOLAX) packet Take 17 g by mouth daily. 09/15/16   Jimmy Footman, MD  simvastatin (ZOCOR) 40 MG tablet Take 40 mg by mouth at bedtime.    [provider]  traZODone (DESYREL) 50 MG tablet Take 75 mg by mouth at bedtime.    [provider]  warfarin (COUMADIN) 10 MG tablet Take 10 mg by mouth daily.    [provider]    Allergies Penicillins  Family History  Problem Relation Age of Onset  . CAD Mother   . CAD Sister     Social History Social History   Tobacco Use  . Smoking status: Current Every Day Smoker    Packs/day: 1.00     Years: 23.00    Pack years: 23.00    Types: Cigarettes  . Smokeless tobacco: Never Used  Substance Use Topics  . Alcohol use: No    Comment: occassionally  . Drug use: Yes    Types: Marijuana    Comment: Last use 11/29/16    Review of Systems Constitutional: No fever/chills Eyes: No visual changes. ENT: No sore throat.  Cardiovascular: Denies chest pain. Respiratory: Denies shortness of breath. Gastrointestinal: No abdominal pain.  No nausea, no vomiting.   Genitourinary: Negative for dysuria. Musculoskeletal: Negative for back pain. Skin: Negative for rash. Neurological: Negative for headaches, focal weakness or numbness.    ____________________________________________   PHYSICAL EXAM:  VITAL SIGNS: ED Triage Vitals  Enc Vitals Group     BP 10/09/17 1800 (!) 143/81     Pulse Rate 10/09/17 1800 (!) 109     Resp 10/09/17 1800 16     Temp 10/09/17 1800 98.1 F (36.7 C)     Temp Source 10/09/17 1800 Oral     SpO2 10/09/17 1800 98 %     Weight 10/09/17 1801 195 lb (88.5 kg)     Height 10/09/17 1801  (1.778 m)     Head Circumference --      Peak Flow --      Pain Score 10/09/17 1801 0     Pain Loc --      Pain Edu? --      Excl. in GC? --     Constitutional: Alert and oriented. Well appearing and in no acute distress. Eyes: Conjunctivae are normal. Head: Atraumatic. Nose: No congestion/rhinnorhea. Mouth/Throat: Mucous membranes are moist. Neck: No stridor.   Cardiovascular: Normal rate, regular rhythm. Grossly normal heart sounds.  Good peripheral circulation. Respiratory: Normal respiratory effort.  No retractions. Lungs CTAB. Gastrointestinal: Soft and nontender. No distention. Musculoskeletal: No lower extremity tenderness nor edema. Neurologic:  Normal speech and language. No gross focal neurologic deficits are appreciated.  Skin:  Skin is warm, dry and intact. No rash noted. Psychiatric: Mood and affect are flat.  Does not appear to be responding to  external stimuli.  He reports he felt suicidal earlier, but this seems to be getting better.  ____________________________________________   LABS (all labs ordered are listed, but only abnormal results are displayed)  Labs Reviewed  COMPREHENSIVE METABOLIC PANEL - Abnormal; Notable for the following components:      Result Value   Glucose, Bld 203 (*)    Calcium 8.8 (*)    AST 47 (*)    All other components within normal limits  ACETAMINOPHEN LEVEL - Abnormal; Notable for the following components:   Acetaminophen (Tylenol), Serum <10 (*)    All other components within normal limits  ETHANOL  SALICYLATE LEVEL  CBC  URINE DRUG SCREEN, QUALITATIVE ([facility] ONLY)   ____________________________________________  EKG   ____________________________________________  RADIOLOGY   ____________________________________________   PROCEDURES  Procedure(s) performed: None  Procedures  Critical Care performed: No  ____________________________________________   INITIAL IMPRESSION / ASSESSMENT AND PLAN / ED COURSE  Pertinent labs & imaging  results that were available during my care of the patient were reviewed by me and considered in my medical decision making (see chart for details).  Patient with known history of schizophrenia presents from his group home for increasing thoughts of hopelessness and seemingly passive suicidal ideation.  Also reports occasional hearing voices.  Previous history of same.  No evidence of acute medical condition, lab work reassuring.  Patient evaluation reassuring.  Patient under IVC, will place for psychiatry consult.  Disposition based on psychiatry recommendations.  Ongoing care assigned to Dr. Zenda Alpers      ____________________________________________   FINAL CLINICAL IMPRESSION(S) / ED DIAGNOSES  Final diagnoses:  Schizophrenia, unspecified type (HCC)      NEW MEDICATIONS STARTED DURING THIS VISIT:  New Prescriptions   No medications  on file     Note:  This document was prepared using Dragon voice recognition software and may include unintentional dictation errors.      Sharyn Creamer, MD 10/10/17 0002

## 2017-10-09 NOTE — ED Notes (Signed)
Introduced self to pt, who says he is not currently suicidal though he has been before arrival. He also endorses HI before arrival. He says he frequently sees people who aren't there. He says he feels hopeless "all the time." He promises to let staff know if he feels like he is growing more agitated. He was given fluids and oriented to the quad.

## 2017-10-09 NOTE — ED Notes (Signed)
Pt also has personal bag with him

## 2017-10-10 DIAGNOSIS — F209 Schizophrenia, unspecified: Secondary | ICD-10-CM | POA: Diagnosis not present

## 2017-10-10 NOTE — ED Notes (Signed)
Telephone call was placed to patient's guardian--[name]) to make her aware of Patient being discharged; was instructed by Ms. Harris to call Patient's group [phone]).

## 2017-10-10 NOTE — ED Notes (Signed)
Patient speaking with S.O.C. 

## 2017-10-10 NOTE — ED Notes (Signed)
Telephone call was placed to Patient's group home to make aware of Patient's discharge; was told that staf will at the Ed to pick up Patient at 0600.

## 2017-10-10 NOTE — Discharge Instructions (Addendum)
Please follow-up with your psychiatrist
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
## 2017-10-10 NOTE — ED Provider Notes (Signed)
-----------------------------------------   2:59 AM on 10/10/2017 -----------------------------------------   Blood pressure (!) 143/81, pulse (!) 109, temperature 98.1 F (36.7 C), temperature source Oral, resp. rate 16, height  (1.778 m), weight 88.5 kg (195 lb), SpO2 98 %.  Assuming care from Dr. Fanny Bien.  In short, Lance [name] is a 42 y.o. male with a chief complaint of Medical Clearance .  Refer to the original H&P for additional details.  The current plan of care is to follow up the recommendations from the specialist on call.     The specialist on-call states that the patient is not internally preoccupied and he does not have any current suicidal ideation.  They recommend reversing the commitment and discharging the patient home to continue his medications.  He will be discharged.   Rebecka Apley, MD 10/10/17 0300

## 2017-10-13 ENCOUNTER — Encounter: Payer: Self-pay | Admitting: Emergency Medicine

## 2017-10-13 ENCOUNTER — Emergency Department
Admission: EM | Admit: 2017-10-13 | Discharge: 2017-10-14 | Disposition: A | Discharge status: Other Acute Inpatient Hospital | Payer: Medicare Other | Attending: Emergency Medicine | Admitting: Emergency Medicine

## 2017-10-13 DIAGNOSIS — F209 Schizophrenia, unspecified: Secondary | ICD-10-CM | POA: Diagnosis not present

## 2017-10-13 DIAGNOSIS — R45850 Homicidal ideations: Secondary | ICD-10-CM | POA: Insufficient documentation

## 2017-10-13 DIAGNOSIS — Z79899 Other long term (current) drug therapy: Secondary | ICD-10-CM | POA: Insufficient documentation

## 2017-10-13 DIAGNOSIS — F203 Undifferentiated schizophrenia: Secondary | ICD-10-CM | POA: Diagnosis not present

## 2017-10-13 DIAGNOSIS — F17210 Nicotine dependence, cigarettes, uncomplicated: Secondary | ICD-10-CM | POA: Insufficient documentation

## 2017-10-13 DIAGNOSIS — I10 Essential (primary) hypertension: Secondary | ICD-10-CM | POA: Insufficient documentation

## 2017-10-13 DIAGNOSIS — E785 Hyperlipidemia, unspecified: Secondary | ICD-10-CM | POA: Diagnosis not present

## 2017-10-13 DIAGNOSIS — Z7901 Long term (current) use of anticoagulants: Secondary | ICD-10-CM

## 2017-10-13 DIAGNOSIS — E119 Type 2 diabetes mellitus without complications: Secondary | ICD-10-CM | POA: Insufficient documentation

## 2017-10-13 DIAGNOSIS — G4700 Insomnia, unspecified: Secondary | ICD-10-CM | POA: Diagnosis not present

## 2017-10-13 DIAGNOSIS — D6862 Lupus anticoagulant syndrome: Secondary | ICD-10-CM | POA: Diagnosis not present

## 2017-10-13 LAB — SALICYLATE LEVEL: Salicylate Lvl: <7 mg/dL (ref 2.8–30.0)

## 2017-10-13 LAB — COMPREHENSIVE METABOLIC PANEL
ALK PHOS: 58 U/L (ref 38–126)
ALT: 31 U/L (ref 17–63)
ANION GAP: 9 (ref 5–15)
AST: 23 U/L (ref 15–41)
Albumin: 4.9 g/dL (ref 3.5–5.0)
BUN: 22 mg/dL — ABNORMAL HIGH (ref 6–20)
CALCIUM: 10.4 mg/dL — AB (ref 8.9–10.3)
CO2: 27 mmol/L (ref 22–32)
Chloride: 104 mmol/L (ref 101–111)
Creatinine, Ser: 1.06 mg/dL (ref 0.61–1.24)
GFR calc non Af Amer: >60 mL/min (ref >60)
GLUCOSE: 152 mg/dL — AB (ref 65–99)
Potassium: 4 mmol/L (ref 3.5–5.1)
Sodium: 140 mmol/L (ref 135–145)
Total Bilirubin: 0.6 mg/dL (ref 0.3–1.2)
Total Protein: 7.8 g/dL (ref 6.5–8.1)

## 2017-10-13 LAB — CBC
HCT: 47.3 % (ref 40.0–52.0)
HEMOGLOBIN: 16.7 g/dL (ref 13.0–18.0)
MCH: 32.3 pg (ref 26.0–34.0)
MCHC: 35.3 g/dL (ref 32.0–36.0)
MCV: 91.6 fL (ref 80.0–100.0)
PLATELETS: 224 10*3/uL (ref 150–440)
RBC: 5.16 MIL/uL (ref 4.40–5.90)
RDW: 13.1 % (ref 11.5–14.5)
WBC: 7.6 10*3/uL (ref 3.8–10.6)

## 2017-10-13 LAB — URINE DRUG SCREEN, QUALITATIVE (ARMC ONLY)
AMPHETAMINES, UR SCREEN: NOT DETECTED
Barbiturates, Ur Screen: NOT DETECTED
Benzodiazepine, Ur Scrn: NOT DETECTED
CANNABINOID 50 NG, UR ~~LOC~~: NOT DETECTED
COCAINE METABOLITE, UR ~~LOC~~: NOT DETECTED
MDMA (ECSTASY) UR SCREEN: NOT DETECTED
Methadone Scn, Ur: NOT DETECTED
Opiate, Ur Screen: NOT DETECTED
Phencyclidine (PCP) Ur S: NOT DETECTED
Tricyclic, Ur Screen: NOT DETECTED

## 2017-10-13 LAB — ETHANOL: Alcohol, Ethyl (B): <10 mg/dL (ref <10)

## 2017-10-13 LAB — LITHIUM LEVEL: LITHIUM LVL: 0.77 mmol/L (ref 0.60–1.20)

## 2017-10-13 LAB — ACETAMINOPHEN LEVEL: Acetaminophen (Tylenol), Serum: <10 ug/mL — ABNORMAL LOW (ref 10–30)

## 2017-10-13 NOTE — ED Notes (Signed)
Report given to SOC 

## 2017-10-13 NOTE — ED Notes (Signed)

## 2017-10-13 NOTE — ED Notes (Signed)
Patient's guardian returned call. This Clinical research associate made guardian aware patient was here in the ED.

## 2017-10-13 NOTE — ED Notes (Signed)
SOC recommends inpatient admission.

## 2017-10-13 NOTE — ED Notes (Signed)
Pt. Alert and oriented, warm and dry, in no distress. Pt. Denies SI. Pt states having HI that has been building up since last week. Patient states he was here last week and was discharged. Patient states he has been having AVH but not at time of assessment. Patient is able to verbally contract with this Clinical research associate. Pt. Encouraged to let nursing staff know of any concerns or needs.

## 2017-10-13 NOTE — ED Provider Notes (Signed)
[facility] Emergency Department Provider Note  ___________________________________________   First MD Initiated Contact with Patient 10/13/17 1947     (approximate)  I have reviewed the triage vital signs and the nursing notes.   HISTORY  Chief Complaint Homicidal  HPI Lance [name] is a 42 y.o. male a history of depression as well as schizophrenia who is presenting to the emergency department with homicidal ideation.  He says that people are irritating him in his group home and he has thoughts of hurting and vague thoughts of killing other people when they approached him.  He denies any person specific but says that it is different people from his group home including residents and staff.  He is denying any homicidal or suicidal ideation at this time and says that he came into the emergency department today to seek help for these feelings which make him feel uncomfortable.  He denies any auditory or visual hallucinations.  Says that he used to smoke marijuana but is not on any drugs at this time except for his prescription medications.  No specific plan on how to hurt or kill anyone.  Past Medical History:  Diagnosis Date  . Depression   . Diabetes mellitus without complication (HCC)   . Hyperlipidemia   . Hypertension   . Lupus anticoagulant disorder (HCC) 06/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  . PE (pulmonary thromboembolism) (HCC)   . Schizo affective schizophrenia (HCC)   . Supratherapeutic INR 11/19/2016    Patient Active Problem List   Diagnosis Date Noted  . 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 Tobacco use disorder 09/01/2016  . Schizoaffective disorder, bipolar type (HCC) 05/22/2016  . Diabetes mellitus without complication (HCC) 05/21/2016  . Hyperlipidemia 05/21/2016  . History of pulmonary embolism 05/21/2016  . Chronic anticoagulation 05/21/2016  . Hypertension 09/25/2015    History reviewed. No pertinent  surgical history.  Prior to Admission medications   Medication Sig Start Date End Date Taking? Authorizing Provider  albuterol (PROVENTIL HFA;VENTOLIN HFA) 108 (90 Base) MCG/ACT inhaler Inhale 1-2 puffs into the lungs every 4 (four) hours as needed for wheezing or shortness of breath. 12/31/16   Jimmy Footman, MD  buPROPion (WELLBUTRIN XL) 150 MG 24 hr tablet Take 1 tablet (150 mg total) by mouth daily. 09/15/16   Jimmy Footman, MD  cloZAPine (CLOZARIL) 100 MG tablet Take 5 tablets (500 mg total) by mouth at bedtime. 12/03/16   Jimmy Footman, MD  Fluticasone-Salmeterol (ADVAIR) 250-50 MCG/DOSE AEPB Inhale 1 puff into the lungs 2 (two) times daily.    [provider]  insulin detemir (LEVEMIR) 100 UNIT/ML injection Inject 14 Units into the skin every morning.    [provider]  ipratropium (ATROVENT) 0.06 % nasal spray Place 1 spray into both nostrils at bedtime. 09/14/16   Jimmy Footman, MD  lisinopril (PRINIVIL,ZESTRIL) 2.5 MG tablet Take 2.5 mg by mouth daily.    [provider]  lithium carbonate (ESKALITH) 450 MG CR tablet Take 1 tablet (450 mg total) by mouth every 12 (twelve) hours. 09/14/16   Jimmy Footman, MD  metformin (FORTAMET) 500 MG (OSM) 24 hr tablet Take 500 mg by mouth 2 (two) times daily with a meal.    [provider]  mometasone-formoterol (DULERA) 200-5 MCG/ACT AERO Inhale 2 puffs into the lungs 2 (two) times daily. 11/20/16   Altamese Dilling, MD  omega-3 acid ethyl esters (LOVAZA) 1 g capsule Take 1 g by mouth daily.  [provider]  polyethylene glycol (MIRALAX / GLYCOLAX) packet Take 17 g by mouth daily. 09/15/16   Jimmy Footman, MD  simvastatin (ZOCOR) 40 MG tablet Take 40 mg by mouth at bedtime.    [provider]  traZODone (DESYREL) 50 MG tablet Take 75 mg by mouth at bedtime.    [provider]  warfarin (COUMADIN) 10 MG tablet Take  10 mg by mouth daily.    [provider]    Allergies Penicillins  Family History  Problem Relation Age of Onset  . CAD Mother   . CAD Sister     Social History Social History   Tobacco Use  . Smoking status: Current Every Day Smoker    Packs/day: 1.00    Years: 23.00    Pack years: 23.00    Types: Cigarettes  . Smokeless tobacco: Never Used  Substance Use Topics  . Alcohol use: No    Comment: occassionally  . Drug use: Yes    Types: Marijuana    Comment: Last use 11/29/16    Review of Systems  Constitutional: No fever/chills Eyes: No visual changes. ENT: No sore throat. Cardiovascular: Denies chest pain. Respiratory: Denies shortness of breath. Gastrointestinal: No abdominal pain.  No nausea, no vomiting.  No diarrhea.  No constipation. Genitourinary: Negative for dysuria. Musculoskeletal: Negative for back pain. Skin: Negative for rash. Neurological: Negative for headaches, focal weakness or numbness.   ____________________________________________   PHYSICAL EXAM:  VITAL SIGNS: ED Triage Vitals  Enc Vitals Group     BP 10/13/17 1907 (!) 152/100     Pulse Rate 10/13/17 1907 (!) 104     Resp 10/13/17 1907 17     Temp 10/13/17 1907 98.4 F (36.9 C)     Temp Source 10/13/17 1907 Oral     SpO2 10/13/17 1907 98 %     Weight 10/13/17 1908 195 lb (88.5 kg)     Height --      Head Circumference --      Peak Flow --      Pain Score 10/13/17 1908 0     Pain Loc --      Pain Edu? --      Excl. in GC? --     Constitutional: Alert and oriented. Well appearing and in no acute distress. Eyes: Conjunctivae are normal.  Head: Atraumatic. Nose: No congestion/rhinnorhea. Mouth/Throat: Mucous membranes are moist.  Neck: No stridor.   Cardiovascular: Normal rate, regular rhythm. Grossly normal heart sounds.   Respiratory: Normal respiratory effort.  No retractions. Lungs CTAB. Gastrointestinal: Soft and nontender. No distention. Musculoskeletal: No  lower extremity tenderness nor edema.  No joint effusions. Neurologic:  Normal speech and language. No gross focal neurologic deficits are appreciated. Skin:  Skin is warm, dry and intact. No rash noted. Psychiatric: Mood and affect are normal. Speech and behavior are normal.  ____________________________________________   LABS (all labs ordered are listed, but only abnormal results are displayed)  Labs Reviewed  COMPREHENSIVE METABOLIC PANEL - Abnormal; Notable for the following components:      Result Value   Glucose, Bld 152 (*)    BUN 22 (*)    Calcium 10.4 (*)    All other components within normal limits  ACETAMINOPHEN LEVEL - Abnormal; Notable for the following components:   Acetaminophen (Tylenol), Serum <10 (*)    All other components within normal limits  ETHANOL  SALICYLATE LEVEL  CBC  URINE DRUG SCREEN, QUALITATIVE ([facility] ONLY)   ____________________________________________  EKG   ____________________________________________  RADIOLOGY   ____________________________________________   PROCEDURES  Procedure(s) performed:   Procedures  Critical Care performed:   ____________________________________________   INITIAL IMPRESSION / ASSESSMENT AND PLAN / ED COURSE  Pertinent labs & imaging results that were available during my care of the patient were reviewed by me and considered in my medical decision making (see chart for details).  DDX: Aggressive behavior, homicidal ideation, depression, adjustment disorder As part of my medical decision making, I reviewed the following data within the electronic MEDICAL RECORD NUMBER Notes from prior ED visits  Patient is calm and cooperative at this time and does not have any suicidal or homicidal ideation currently.  I will not IVC him currently as I feel he does not be criteria.  However, I believe he will benefit from a psychiatric consultation. ____________________________________________   FINAL CLINICAL  IMPRESSION(S) / ED DIAGNOSES  Homicidal ideation.    NEW MEDICATIONS STARTED DURING THIS VISIT:  New Prescriptions   No medications on file     Note:  This document was prepared using Dragon voice recognition software and may include unintentional dictation errors.     Myrna Blazer, MD 10/13/17 2025

## 2017-10-13 NOTE — ED Notes (Signed)
Attempted to call Patient guardian [name] at [phone] to make aware patient was in ED. No answer HIPPA compliant message left for call back.

## 2017-10-13 NOTE — ED Notes (Signed)
Called SOC for consult 

## 2017-10-13 NOTE — ED Triage Notes (Signed)
Pt arrived to ED via [place] [employer] after pt went to PD with homicidal ideations and the request to come to the ED. Pt sts he has had thoughts of hurting people more often in the last few days. Pt has hx/o schizophrenia. Pt seen in ED last week for same and was released. Pt requesting to stay longer due to worsening HI. Pt is calm and cooperative in triage. Pt denies SI.

## 2017-10-13 NOTE — ED Notes (Signed)
EDP in with patient 

## 2017-10-14 ENCOUNTER — Inpatient Hospital Stay
Admission: AD | Admit: 2017-10-14 | Discharge: 2017-10-22 | DRG: 885 | Disposition: A | Discharge status: Home / Self Care | Payer: Medicare Other | Attending: Psychiatry | Admitting: Psychiatry

## 2017-10-14 ENCOUNTER — Other Ambulatory Visit: Payer: Self-pay

## 2017-10-14 DIAGNOSIS — Z8249 Family history of ischemic heart disease and other diseases of the circulatory system: Secondary | ICD-10-CM

## 2017-10-14 DIAGNOSIS — Z915 Personal history of self-harm: Secondary | ICD-10-CM

## 2017-10-14 DIAGNOSIS — F209 Schizophrenia, unspecified: Principal | ICD-10-CM | POA: Diagnosis present

## 2017-10-14 DIAGNOSIS — Z7901 Long term (current) use of anticoagulants: Secondary | ICD-10-CM

## 2017-10-14 DIAGNOSIS — R45850 Homicidal ideations: Secondary | ICD-10-CM | POA: Diagnosis present

## 2017-10-14 DIAGNOSIS — Z79899 Other long term (current) drug therapy: Secondary | ICD-10-CM | POA: Diagnosis not present

## 2017-10-14 DIAGNOSIS — E119 Type 2 diabetes mellitus without complications: Secondary | ICD-10-CM | POA: Diagnosis present

## 2017-10-14 DIAGNOSIS — G4700 Insomnia, unspecified: Secondary | ICD-10-CM | POA: Diagnosis present

## 2017-10-14 DIAGNOSIS — E785 Hyperlipidemia, unspecified: Secondary | ICD-10-CM | POA: Diagnosis present

## 2017-10-14 DIAGNOSIS — F17210 Nicotine dependence, cigarettes, uncomplicated: Secondary | ICD-10-CM | POA: Diagnosis present

## 2017-10-14 DIAGNOSIS — D6862 Lupus anticoagulant syndrome: Secondary | ICD-10-CM | POA: Diagnosis present

## 2017-10-14 DIAGNOSIS — F4010 Social phobia, unspecified: Secondary | ICD-10-CM | POA: Diagnosis present

## 2017-10-14 DIAGNOSIS — Z86711 Personal history of pulmonary embolism: Secondary | ICD-10-CM

## 2017-10-14 DIAGNOSIS — Z880 Allergy status to penicillin: Secondary | ICD-10-CM

## 2017-10-14 DIAGNOSIS — F203 Undifferentiated schizophrenia: Secondary | ICD-10-CM | POA: Diagnosis not present

## 2017-10-14 DIAGNOSIS — Z794 Long term (current) use of insulin: Secondary | ICD-10-CM | POA: Diagnosis not present

## 2017-10-14 DIAGNOSIS — F23 Brief psychotic disorder: Secondary | ICD-10-CM | POA: Diagnosis present

## 2017-10-14 DIAGNOSIS — I10 Essential (primary) hypertension: Secondary | ICD-10-CM | POA: Diagnosis present

## 2017-10-14 DIAGNOSIS — Z716 Tobacco abuse counseling: Secondary | ICD-10-CM

## 2017-10-14 LAB — CBC WITH DIFFERENTIAL/PLATELET
Basophils Absolute: 0 10*3/uL (ref 0–0.1)
Basophils Relative: 1 %
EOS ABS: 0 10*3/uL (ref 0–0.7)
EOS PCT: 0 %
HCT: 46.7 % (ref 40.0–52.0)
Hemoglobin: 16.2 g/dL (ref 13.0–18.0)
LYMPHS PCT: 32 %
Lymphs Abs: 1.9 10*3/uL (ref 1.0–3.6)
MCH: 31.7 pg (ref 26.0–34.0)
MCHC: 34.6 g/dL (ref 32.0–36.0)
MCV: 91.7 fL (ref 80.0–100.0)
Monocytes Absolute: 0.8 10*3/uL (ref 0.2–1.0)
Monocytes Relative: 14 %
Neutro Abs: 3.3 10*3/uL (ref 1.4–6.5)
Neutrophils Relative %: 53 %
PLATELETS: 215 10*3/uL (ref 150–440)
RBC: 5.09 MIL/uL (ref 4.40–5.90)
RDW: 12.8 % (ref 11.5–14.5)
WBC: 6.1 10*3/uL (ref 3.8–10.6)

## 2017-10-14 LAB — GLUCOSE, CAPILLARY: Glucose-Capillary: 116 mg/dL — ABNORMAL HIGH (ref 65–99)

## 2017-10-14 MED ORDER — SIMVASTATIN 40 MG PO TABS
40.0000 mg | ORAL_TABLET | Freq: Every day | ORAL | Status: DC
  Administered 2017-10-14 – 2017-10-21 (×7): 40 mg via ORAL
  Filled 2017-10-14 (×7): qty 1

## 2017-10-14 MED ORDER — METFORMIN HCL 500 MG PO TABS
500.0000 mg | ORAL_TABLET | Freq: Two times a day (BID) | ORAL | Status: DC
  Administered 2017-10-14 – 2017-10-22 (×16): 500 mg via ORAL
  Filled 2017-10-14 (×16): qty 1

## 2017-10-14 MED ORDER — INSULIN DETEMIR 100 UNIT/ML ~~LOC~~ SOLN
14.0000 [IU] | Freq: Every day | SUBCUTANEOUS | Status: DC
  Administered 2017-10-15 – 2017-10-22 (×8): 14 [IU] via SUBCUTANEOUS
  Filled 2017-10-14 (×9): qty 0.14

## 2017-10-14 MED ORDER — ALBUTEROL SULFATE HFA 108 (90 BASE) MCG/ACT IN AERS
2.0000 | INHALATION_SPRAY | Freq: Four times a day (QID) | RESPIRATORY_TRACT | Status: DC | PRN
  Filled 2017-10-14: qty 6.7

## 2017-10-14 MED ORDER — METFORMIN HCL 500 MG PO TABS: 500.0000 mg | ORAL_TABLET | Freq: Two times a day (BID) | ORAL | Status: DC

## 2017-10-14 MED ORDER — BUPROPION HCL ER (XL) 150 MG PO TB24
150.0000 mg | ORAL_TABLET | Freq: Every day | ORAL | Status: DC
  Administered 2017-10-14: 150 mg via ORAL
  Filled 2017-10-14: qty 1

## 2017-10-14 MED ORDER — POLYETHYLENE GLYCOL 3350 17 G PO PACK
17.0000 g | PACK | Freq: Every day | ORAL | Status: DC
  Filled 2017-10-14: qty 1

## 2017-10-14 MED ORDER — POLYETHYLENE GLYCOL 3350 17 G PO PACK
17.0000 g | PACK | Freq: Every day | ORAL | Status: DC
  Administered 2017-10-19: 17 g via ORAL
  Filled 2017-10-14 (×3): qty 1

## 2017-10-14 MED ORDER — TRAZODONE HCL 50 MG PO TABS
ORAL_TABLET | ORAL | Status: AC
  Filled 2017-10-14: qty 2

## 2017-10-14 MED ORDER — MOMETASONE FURO-FORMOTEROL FUM 200-5 MCG/ACT IN AERO
2.0000 | INHALATION_SPRAY | Freq: Two times a day (BID) | RESPIRATORY_TRACT | Status: DC
  Filled 2017-10-14: qty 8.8

## 2017-10-14 MED ORDER — WARFARIN SODIUM 10 MG PO TABS
10.0000 mg | ORAL_TABLET | Freq: Every day | ORAL | Status: DC
  Filled 2017-10-14: qty 1

## 2017-10-14 MED ORDER — TRAZODONE HCL 50 MG PO TABS
75.0000 mg | ORAL_TABLET | Freq: Every day | ORAL | Status: DC
  Administered 2017-10-14 – 2017-10-21 (×8): 75 mg via ORAL
  Filled 2017-10-14 (×8): qty 2

## 2017-10-14 MED ORDER — SIMVASTATIN 20 MG PO TABS
40.0000 mg | ORAL_TABLET | Freq: Every day | ORAL | Status: DC
  Filled 2017-10-14: qty 2

## 2017-10-14 MED ORDER — IPRATROPIUM BROMIDE 0.03 % NA SOLN
2.0000 | Freq: Every day | NASAL | Status: DC
  Administered 2017-10-14 – 2017-10-17 (×3): 2 via NASAL
  Filled 2017-10-14: qty 30

## 2017-10-14 MED ORDER — WARFARIN - PHYSICIAN DOSING INPATIENT
Freq: Every day | Status: DC
  Filled 2017-10-14 (×7): qty 1

## 2017-10-14 MED ORDER — IPRATROPIUM BROMIDE 0.06 % NA SOLN
2.0000 | Freq: Every day | NASAL | Status: DC
  Filled 2017-10-14: qty 15

## 2017-10-14 MED ORDER — LISINOPRIL 2.5 MG PO TABS
2.5000 mg | ORAL_TABLET | Freq: Every day | ORAL | Status: DC
  Administered 2017-10-15 – 2017-10-22 (×6): 2.5 mg via ORAL
  Filled 2017-10-14 (×9): qty 1

## 2017-10-14 MED ORDER — CLOZAPINE 100 MG PO TABS: 500.0000 mg | ORAL_TABLET | Freq: Every day | ORAL | Status: DC

## 2017-10-14 MED ORDER — TRAZODONE HCL 50 MG PO TABS
75.0000 mg | ORAL_TABLET | Freq: Every day | ORAL | Status: DC
  Administered 2017-10-14: 75 mg via ORAL

## 2017-10-14 MED ORDER — BUPROPION HCL ER (XL) 150 MG PO TB24
150.0000 mg | ORAL_TABLET | Freq: Every day | ORAL | Status: DC
  Administered 2017-10-15: 150 mg via ORAL
  Filled 2017-10-14: qty 1

## 2017-10-14 MED ORDER — LISINOPRIL 5 MG PO TABS
2.5000 mg | ORAL_TABLET | Freq: Every day | ORAL | Status: DC
  Administered 2017-10-14: 2.5 mg via ORAL
  Filled 2017-10-14: qty 1

## 2017-10-14 MED ORDER — LITHIUM CARBONATE ER 450 MG PO TBCR
450.0000 mg | EXTENDED_RELEASE_TABLET | Freq: Two times a day (BID) | ORAL | Status: DC
  Administered 2017-10-14 – 2017-10-22 (×16): 450 mg via ORAL
  Filled 2017-10-14 (×17): qty 1

## 2017-10-14 MED ORDER — TRAZODONE HCL 50 MG PO TABS: 75.0000 mg | ORAL_TABLET | Freq: Every day | ORAL | Status: DC

## 2017-10-14 MED ORDER — LITHIUM CARBONATE ER 450 MG PO TBCR
450.0000 mg | EXTENDED_RELEASE_TABLET | Freq: Two times a day (BID) | ORAL | Status: DC
  Administered 2017-10-14: 450 mg via ORAL
  Filled 2017-10-14: qty 1

## 2017-10-14 MED ORDER — INSULIN DETEMIR 100 UNIT/ML ~~LOC~~ SOLN
14.0000 [IU] | Freq: Every day | SUBCUTANEOUS | Status: DC
  Filled 2017-10-14 (×2): qty 0.14

## 2017-10-14 MED ORDER — CLOZAPINE 100 MG PO TABS
500.0000 mg | ORAL_TABLET | Freq: Every day | ORAL | Status: DC
  Administered 2017-10-14 – 2017-10-21 (×8): 500 mg via ORAL
  Filled 2017-10-14 (×8): qty 5

## 2017-10-14 MED ORDER — WARFARIN SODIUM 3 MG PO TABS
10.0000 mg | ORAL_TABLET | Freq: Every day | ORAL | Status: DC
  Filled 2017-10-14: qty 2

## 2017-10-14 NOTE — ED Provider Notes (Signed)
-----------------------------------------   6:25 AM on 10/14/2017 -----------------------------------------   Blood pressure (!) 152/100, pulse (!) 104, temperature 98.4 F (36.9 C), temperature source Oral, resp. rate 17, weight 88.5 kg (195 lb), SpO2 98 %.  The patient had no acute events since last update.  Calm and cooperative at this time.  Disposition is pending Psychiatry/Behavioral Medicine team recommendations.     Irean Hong, MD 10/14/17 [phone]

## 2017-10-14 NOTE — Consult Note (Signed)
[facility] Consult   Reason for Consult: Consult for 42 year old man well-known to [place] who has schizophrenia who is reporting homicidal ideation Referring Physician: Lenise Arena Patient Identification: Lance [name] MRN:  [id number] Principal Diagnosis: Schizophrenia, undifferentiated ([place]) Diagnosis:   Patient Active Problem List   Diagnosis Date Noted  . Schizophrenia, undifferentiated ([place]) [F20.3] 09/20/2017  . Tobacco use disorder [F17.200] 09/01/2016  . Schizoaffective disorder, bipolar type ([place]) [F25.0] 05/22/2016  . Diabetes mellitus without complication ([place]) [S06.3] 05/21/2016  . Hyperlipidemia [E78.5] 05/21/2016  . History of pulmonary embolism [Z86.711] 05/21/2016  . Chronic anticoagulation [Z79.01] 05/21/2016  . Hypertension [I10] 09/25/2015    Total Time spent with patient: 1 hour  Subjective:   Lance [name] is a 42 y.o. male patient admitted with "I am not doing too good".  HPI: Patient interviewed chart reviewed.  Patient well known from many prior encounters.  42 year old man with schizophrenia.  Had himself brought over here from his group home because he says for several days now he has been having homicidal ideation.  He describes that when certain people will sit near him or be in his presence he will have a feeling rise up inside him that he wants to assault these people.  He denies that he is conscious of being angry at them.  Denies having any positive feeling about this.  Says the whole thing is very unpleasant and frightening for him.  Also some suicidal ideation without specific current plan.  Patient has chronic auditory and visual hallucinations.  Sleep has been somewhat impaired.  He insists he has been fully compliant with his medicines and has not been using any drugs or alcohol.  The biggest stress he can come up with his that the group home management is proposing that they move him to a different facility in another month and he  does not want to do it.  Medical history: Multiple significant chronic medical problems including insulin-dependent diabetes high blood pressure history of pulmonary embolism with chronic anticoagulation  Substance abuse history: Past history of abuse of marijuana but that seems to have tapered off recently  Family history: Unknown  Past Psychiatric History: Patient has had multiple hospitalizations going back years.  Diagnosis has been either schizoaffective disorder or schizophrenia although as time has gone on it seems more like a schizophrenic disorder.  He has a history of both suicide attempts and violence in the past although actually acting out has been less common lately.  Multiple medications have been tried.  Currently on clozapine still with only partial improvement.  Risk to Self: Suicidal Ideation: No-Not Currently/Within Last 6 Months Suicidal Intent: No Is patient at risk for suicide?: No, but patient needs Medical Clearance Suicidal Plan?: No Access to Means: Yes Specify Access to Suicidal Means: Pt reports that he has thought in the past about jumping in front of a moving car What has been your use of drugs/alcohol within the last 12 months?: None indicated How many times?: 2 Other Self Harm Risks: hx of cutting Triggers for Past Attempts: Hallucinations Intentional Self Injurious Behavior: Cutting Comment - Self Injurious Behavior: Pt reports he has hx of cutting himself. None recent Risk to Others: Homicidal Ideation: No-Not Currently/Within Last 6 Months Thoughts of Harm to Others: Yes-Currently Present Comment - Thoughts of Harm to Others: Pt reports to having recurrent thoughts at random times of hurting others Current Homicidal Intent: No-Not Currently/Within Last 6 Months Current Homicidal Plan: No-Not Currently/Within Last 6 Months Access to  Homicidal Means: No Identified Victim: Pt reports to having thoughts toward random ppl including housemates History of  harm to others?: Yes Assessment of Violence: None Noted Violent Behavior Description: Pt stated he hx of assault charges, recurrent thoughts of wanting to hurt others Does patient have access to weapons?: No Criminal Charges Pending?: No Does patient have a court date: No Prior Inpatient Therapy: Prior Inpatient Therapy: Yes Prior Therapy Dates: various times bewteen 2018- current Prior Therapy Facilty/Provider(s): [facility] Reason for Treatment: Schizophrenia Prior Outpatient Therapy: Prior Outpatient Therapy: No Does patient have an ACCT team?: Unknown(Pt reports to recieving ACTT services at [facility]) Does patient have Intensive In-House Services?  : No Does patient have [facility] services? : No Does patient have P4CC services?: No  Past Medical History:  Past Medical History:  Diagnosis Date  . Depression   . Diabetes mellitus without complication ([place])   . Hyperlipidemia   . Hypertension   . Lupus anticoagulant disorder ([place]) 06/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  . PE (pulmonary thromboembolism) ([place])   . Schizo affective schizophrenia ([place])   . Supratherapeutic INR 11/19/2016   History reviewed. No pertinent surgical history. Family History:  Family History  Problem Relation Age of Onset  . CAD Mother   . CAD Sister    Family Psychiatric  History: Unknown Social History:  Social History   Substance and Sexual Activity  Alcohol Use No   Comment: occassionally     Social History   Substance and Sexual Activity  Drug Use Yes  . Types: Marijuana   Comment: Last use 11/29/16    Social History   Socioeconomic History  . Marital status: Single    Spouse name: Not on file  . Number of children: Not on file  . Years of education: Not on file  . Highest education level: Not on file  Occupational History  . Not on file  Social Needs  . Financial resource strain: Not on file  . Food insecurity:    Worry: Not on file    Inability: Not on file  .  Transportation needs:    Medical: Not on file    Non-medical: Not on file  Tobacco Use  . Smoking status: Current Every Day Smoker    Packs/day: 1.00    Years: 23.00    Pack years: 23.00    Types: Cigarettes  . Smokeless tobacco: Never Used  Substance and Sexual Activity  . Alcohol use: No    Comment: occassionally  . Drug use: Yes    Types: Marijuana    Comment: Last use 11/29/16  . Sexual activity: Never    Birth control/protection: None    Comment: occasional marijuana- none recently  Lifestyle  . Physical activity:    Days per week: Not on file    Minutes per session: Not on file  . Stress: Not on file  Relationships  . Social connections:    Talks on phone: Not on file    Gets together: Not on file    Attends religious service: Not on file    Active member of club or organization: Not on file    Attends meetings of clubs or organizations: Not on file    Relationship status: Not on file  Other Topics Concern  . Not on file  Social History Narrative   From a group home in [place]   Additional Social History:    Allergies:   Allergies  Allergen Reactions  .  Penicillins Other (See Comments)    Reaction: "lockjaw" Has patient had a PCN reaction causing immediate rash, facial/tongue/throat swelling, SOB or lightheadedness with hypotension: No Has patient had a PCN reaction causing severe rash involving mucus membranes or skin necrosis: No Has patient had a PCN reaction that required hospitalization: No Has patient had a PCN reaction occurring within the last 10 years: No If all of the above answers are "NO", then may proceed with Cephalosporin use.     Labs:  Results for orders placed or performed during the hospital encounter of 10/13/17 (from the past 48 hour(s))  Comprehensive metabolic panel     Status: Abnormal   Collection Time: 10/13/17  7:09 PM  Result Value Ref Range   Sodium 140 135 - 145 mmol/L   Potassium 4.0 3.5 - 5.1 mmol/L   Chloride 104  101 - 111 mmol/L   CO2 27 22 - 32 mmol/L   Glucose, Bld 152 (H) 65 - 99 mg/dL   BUN 22 (H) 6 - 20 mg/dL   Creatinine, Ser 1.06 0.61 - 1.24 mg/dL   Calcium 10.4 (H) 8.9 - 10.3 mg/dL   Total Protein 7.8 6.5 - 8.1 g/dL   Albumin 4.9 3.5 - 5.0 g/dL   AST 23 15 - 41 U/L   ALT 31 17 - 63 U/L   Alkaline Phosphatase 58 38 - 126 U/L   Total Bilirubin 0.6 0.3 - 1.2 mg/dL   GFR calc non Af Amer >60 >60 mL/min   GFR calc Af Amer >60 >60 mL/min    Comment: (NOTE) The eGFR has been calculated using the CKD EPI equation. This calculation has not been validated in all clinical situations. eGFR's persistently <60 mL/min signify possible Chronic Kidney Disease.    Anion gap 9 5 - 15    Comment: Performed at [facility], [address] [place], [place] [address]  Ethanol     Status: None   Collection Time: 10/13/17  7:09 PM  Result Value Ref Range   Alcohol, Ethyl (B) <10 <10 mg/dL    Comment:        LOWEST DETECTABLE LIMIT FOR SERUM ALCOHOL IS 10 mg/dL FOR MEDICAL PURPOSES ONLY Performed at [facility], [address] [place], [place] [address]   Salicylate level     Status: None   Collection Time: 10/13/17  7:09 PM  Result Value Ref Range   Salicylate Lvl <9.8 2.8 - 30.0 mg/dL    Comment: Performed at [facility], [address] [place], [place] [address]  Acetaminophen level     Status: Abnormal   Collection Time: 10/13/17  7:09 PM  Result Value Ref Range   Acetaminophen (Tylenol), Serum <10 (L) 10 - 30 ug/mL    Comment:        THERAPEUTIC CONCENTRATIONS VARY SIGNIFICANTLY. A RANGE OF 10-30 ug/mL MAY BE AN EFFECTIVE CONCENTRATION FOR MANY PATIENTS. HOWEVER, SOME ARE BEST TREATED AT CONCENTRATIONS OUTSIDE THIS RANGE. ACETAMINOPHEN CONCENTRATIONS >150 ug/mL AT 4 HOURS AFTER INGESTION AND >50 ug/mL AT 12 HOURS AFTER INGESTION ARE OFTEN ASSOCIATED WITH TOXIC REACTIONS. Performed at [facility], [address] [place], [place]  [address]   cbc     Status: None   Collection Time: 10/13/17  7:09 PM  Result Value Ref Range   WBC 7.6 3.8 - 10.6 K/uL   RBC 5.16 4.40 - 5.90 MIL/uL   Hemoglobin 16.7 13.0 - 18.0 g/dL   HCT 47.3 40.0 - 52.0 %  MCV 91.6 80.0 - 100.0 fL   MCH 32.3 26.0 - 34.0 pg   MCHC 35.3 32.0 - 36.0 g/dL   RDW 13.1 11.5 - 14.5 %   Platelets 224 150 - 440 K/uL    Comment: Performed at [facility], [address] [place], [place] [address]  Urine Drug Screen, Qualitative     Status: None   Collection Time: 10/13/17  7:09 PM  Result Value Ref Range   Tricyclic, Ur Screen NONE DETECTED NONE DETECTED   Amphetamines, Ur Screen NONE DETECTED NONE DETECTED   MDMA (Ecstasy)Ur Screen NONE DETECTED NONE DETECTED   Cocaine Metabolite,Ur [place] NONE DETECTED NONE DETECTED   Opiate, Ur Screen NONE DETECTED NONE DETECTED   Phencyclidine (PCP) Ur S NONE DETECTED NONE DETECTED   Cannabinoid 50 Ng, Ur [place] NONE DETECTED NONE DETECTED   Barbiturates, Ur Screen NONE DETECTED NONE DETECTED   Benzodiazepine, Ur Scrn NONE DETECTED NONE DETECTED   Methadone Scn, Ur NONE DETECTED NONE DETECTED    Comment: (NOTE) Tricyclics + metabolites, urine    Cutoff 1000 ng/mL Amphetamines + metabolites, urine  Cutoff 1000 ng/mL MDMA (Ecstasy), urine              Cutoff 500 ng/mL Cocaine Metabolite, urine          Cutoff 300 ng/mL Opiate + metabolites, urine        Cutoff 300 ng/mL Phencyclidine (PCP), urine         Cutoff 25 ng/mL Cannabinoid, urine                 Cutoff 50 ng/mL Barbiturates + metabolites, urine  Cutoff 200 ng/mL Benzodiazepine, urine              Cutoff 200 ng/mL Methadone, urine                   Cutoff 300 ng/mL The urine drug screen provides only a preliminary, unconfirmed analytical test result and should not be used for non-medical purposes. Clinical consideration and professional judgment should be applied to any positive drug screen result due to possible interfering substances. A more specific  alternate chemical method must be used in order to obtain a confirmed analytical result. Gas chromatography / mass spectrometry (GC/MS) is the preferred confirmat ory method. Performed at [facility], [address] [place], [place] [address]   Lithium level     Status: None   Collection Time: 10/13/17  7:09 PM  Result Value Ref Range   Lithium Lvl 0.77 0.60 - 1.20 mmol/L    Comment: Performed at [facility], [address] [place],  [address]    Current Facility-Administered Medications  Medication Dose Route Frequency Provider Last Rate Last Dose  . traZODone (DESYREL) 50 MG tablet           . traZODone (DESYREL) tablet 75 mg  75 mg Oral QHS Paulette Blanch, MD   75 mg at 10/14/17 0125   Current Outpatient Medications  Medication Sig Dispense Refill  . albuterol (PROVENTIL HFA;VENTOLIN HFA) 108 (90 Base) MCG/ACT inhaler Inhale 1-2 puffs into the lungs every 4 (four) hours as needed for wheezing or shortness of breath. 1 Inhaler 0  . buPROPion (WELLBUTRIN XL) 150 MG 24 hr tablet Take 1 tablet (150 mg total) by mouth daily. 30 tablet 0  . cloZAPine (CLOZARIL) 100 MG tablet Take 5 tablets (500 mg total) by mouth at bedtime. 150 tablet 0  . Fluticasone-Salmeterol (ADVAIR) 250-50 MCG/DOSE  AEPB Inhale 1 puff into the lungs 2 (two) times daily.    . insulin detemir (LEVEMIR) 100 UNIT/ML injection Inject 14 Units into the skin every morning.    Marland Kitchen ipratropium (ATROVENT) 0.06 % nasal spray Place 1 spray into both nostrils at bedtime. 3 mL 0  . lisinopril (PRINIVIL,ZESTRIL) 2.5 MG tablet Take 2.5 mg by mouth daily.    Marland Kitchen lithium carbonate (ESKALITH) 450 MG CR tablet Take 1 tablet (450 mg total) by mouth every 12 (twelve) hours. 60 tablet 0  . metformin (FORTAMET) 500 MG (OSM) 24 hr tablet Take 500 mg by mouth 2 (two) times daily with a meal.    . mometasone-formoterol (DULERA) 200-5 MCG/ACT AERO Inhale 2 puffs into the lungs 2 (two) times daily. 1 Inhaler 1  .  omega-3 acid ethyl esters (LOVAZA) 1 g capsule Take 1 g by mouth daily.    . polyethylene glycol (MIRALAX / GLYCOLAX) packet Take 17 g by mouth daily. 30 each 0  . simvastatin (ZOCOR) 40 MG tablet Take 40 mg by mouth at bedtime.    . traZODone (DESYREL) 50 MG tablet Take 75 mg by mouth at bedtime.    Marland Kitchen warfarin (COUMADIN) 10 MG tablet Take 10 mg by mouth daily.      Musculoskeletal: Strength & Muscle Tone: within normal limits Gait & Station: normal Patient leans: N/A  Psychiatric Specialty Exam: Physical Exam  Nursing note and vitals reviewed. Constitutional: He appears well-developed and well-nourished.  HENT:  Head: Normocephalic and atraumatic.  Eyes: Pupils are equal, round, and reactive to light. Conjunctivae are normal.  Neck: Normal range of motion.  Cardiovascular: Regular rhythm and normal heart sounds.  Respiratory: [place] normal. No respiratory distress.  GI: Soft.  Musculoskeletal: Normal range of motion.  Neurological: He is alert.  Skin: Skin is warm and dry.  Psychiatric: His speech is normal and behavior is normal. His mood appears anxious. His affect is not blunt. Thought content is paranoid. Cognition and memory are impaired. He expresses impulsivity. He exhibits a depressed mood. He expresses homicidal and suicidal ideation.    Review of Systems  Constitutional: Negative.   HENT: Negative.   Eyes: Negative.   Respiratory: Negative.   Cardiovascular: Negative.   Gastrointestinal: Negative.   Musculoskeletal: Negative.   Skin: Negative.   Neurological: Negative.   Psychiatric/Behavioral: Positive for depression, hallucinations and suicidal ideas. Negative for memory loss and substance abuse. The patient is nervous/anxious and has insomnia.     Blood pressure (!) 152/100, pulse (!) 104, temperature 98.4 F (36.9 C), temperature source Oral, resp. rate 17, weight 88.5 kg (195 lb), SpO2 98 %.Body mass index is 27.98 kg/m.  General Appearance: Disheveled  Eye  Contact:  Fair  Speech:  Normal Rate  Volume:  Normal  Mood:  Depressed and Dysphoric  Affect:  Constricted  Thought Process:  Goal Directed  Orientation:  Full (Time, Place, and Person)  Thought Content:  Logical, Hallucinations: Auditory Visual and Paranoid Ideation  Suicidal Thoughts:  Yes.  without intent/plan  Homicidal Thoughts:  Yes.  without intent/plan  Memory:  Immediate;   Fair Recent;   Fair Remote;   Fair  Judgement:  Fair  Insight:  Fair  Psychomotor Activity:  Normal  Concentration:  Concentration: Fair  Recall:  [employer] of Knowledge:  Fair  Language:  Fair  Akathisia:  No  Handed:  Right  AIMS (if indicated):     Assets:  Communication Skills Desire for Improvement Financial Resources/Insurance Housing Resilience  Social Support  ADL's:  Intact  Cognition:  WNL  Sleep:        Treatment Plan Summary: Daily contact with patient to assess and evaluate symptoms and progress in treatment, Medication management and Plan 42 year old man with a history of schizophrenia having thoughts about assaulting others.  He does have a history of aggression in the past.  Patient is currently calm and cooperative.  Plan is to admit him to the psychiatric hospital.  Full set of labs will be reviewed.  Current medication will be continued for now.  Case reviewed with TTS and emergency room physician.  Disposition: Recommend psychiatric Inpatient admission when medically cleared. Supportive therapy provided about ongoing stressors.  Alethia Berthold, MD 10/14/2017 1:23 PM
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
## 2017-10-14 NOTE — ED Notes (Signed)
IVC/ Plan to admit to BMU/ Moved to BHU-1 til room ready

## 2017-10-14 NOTE — BH Assessment (Signed)
Patient is to be admitted to [facility] by Dr. Toni Amend.  Attending Physician will be Dr. Johnella Moloney.   Patient has been assigned to room 306-A, by [facility] Charge Nurse Wagoner.   Intake Paper Work has been signed and placed on patient chart.  ER staff is aware of the admission:  Glenda: ER Sectary   Dr. Mayford Knife: ER MD   Gigi: Patient's Nurse   Mertie Clause: Patient Access.

## 2017-10-14 NOTE — Consult Note (Signed)
Pharmacy is monitoring clozapine. Pt is eligible in REMS program. Last lab 09/20/17. I ordered a stat CBC w/ diff- called lab-they said they would send someone to collect. Weekly monitoring while inpatient  Olene Floss, Pharm.D, BCPS Clinical Pharmacist

## 2017-10-14 NOTE — Progress Notes (Signed)
42 year old male admitted to unit. Denies SI/HI and states that he sometimes sees flashes of light or  holograms of people but denies currently. Patient reports that current stressor is the fact he don't like being around people and sometimes feels like hurting them. Denies having any thoughts of hurting anyone or himself. Oriented patient to room and unit. Skin and contraband search completed and witnessed by Truddie Coco, Charity fundraiser. Tattoo to L) chest area along with scar to inside of L) inner calf and bruise to R) calf. Skin otherwise warm, dry and intact. No contraband found on patient nor his belongings. Admission assessment completed, fluid and nutrition offered. Patient remains safe on the unit with q 15 minute checks.

## 2017-10-14 NOTE — ED Notes (Signed)
Pt given juice at this time, pt in room watching tv. NAD noted. Pt calm and cooperative

## 2017-10-14 NOTE — Plan of Care (Signed)
Patient newly admitted to the unit, able to verbalize feelings to staff.

## 2017-10-14 NOTE — BH Assessment (Addendum)
Per Dr. Clapac's patient meets criteria for inpatient psychiatric treatment. Pending bed assignment on BMU.  

## 2017-10-14 NOTE — Plan of Care (Signed)
Patient is adjusting well in the unit, complying with his medication regimen, with minimal socialization with peers, patient rather pace the hall way but present no harm to self or others, patient states that he just want to rest and recoup his body, denies any SI/HI and no signs of AVH at this time, 15 minute safety check is maintained no distress noted. Problem: Education: Goal: Utilization of techniques to improve thought processes will improve Outcome: Progressing Goal: Knowledge of the prescribed therapeutic regimen will improve Outcome: Progressing   Problem: Activity: Goal: Interest or engagement in leisure activities will improve Outcome: Progressing Goal: Imbalance in normal sleep/wake cycle will improve Outcome: Progressing   Problem: Coping: Goal: Coping ability will improve Outcome: Progressing Goal: Will verbalize feelings Outcome: Progressing   Problem: Health Behavior/Discharge Planning: Goal: Ability to make decisions will improve Outcome: Progressing Goal: Compliance with therapeutic regimen will improve Outcome: Progressing   Problem: Role Relationship: Goal: Will demonstrate positive changes in social behaviors and relationships Outcome: Progressing   Problem: Safety: Goal: Ability to disclose and discuss suicidal ideas will improve Outcome: Progressing Goal: Ability to identify and utilize support systems that promote safety will improve Outcome: Progressing   Problem: Self-Concept: Goal: Will verbalize positive feelings about self Outcome: Progressing Goal: Level of anxiety will decrease Outcome: Progressing

## 2017-10-14 NOTE — BH Assessment (Signed)
This Clinical research associate contacted [facility] at [phone] to inform that client is at [facility] pending psych disposition. Left HIPPA compliant message for return call.

## 2017-10-14 NOTE — Progress Notes (Signed)
Patient ID: Lance [name], male   DOB: 1975/12/25, 42 y.o.   MRN: [id number]  Per State regulations 482.30 this chart was reviewed for medical necessity with respect to the patient's admission/duration of stay.    Next review date: 10/18/17  Thurman Coyer, BSN, RN-BC  Case Manager

## 2017-10-14 NOTE — ED Notes (Signed)
Patient up in room pacing

## 2017-10-14 NOTE — Tx Team (Signed)
Initial Treatment Plan 10/14/2017 4:56 PM [name] ZOX:[id number]    PATIENT STRESSORS: Financial difficulties Health problems   PATIENT STRENGTHS: Ability for insight Motivation for treatment/growth   PATIENT IDENTIFIED PROBLEMS: Depression  Visual hallucinations                   DISCHARGE CRITERIA:  Adequate post-discharge living arrangements Improved stabilization in mood, thinking, and/or behavior  PRELIMINARY DISCHARGE PLAN: Outpatient therapy Return to previous living arrangement  PATIENT/FAMILY INVOLVEMENT: This treatment plan has been presented to and reviewed with the patient, Lance [name], and/or family member.  The patient and family have been given the opportunity to ask questions and make suggestions.  Jim Desanctis [name], RN 10/14/2017, 4:56 PM

## 2017-10-15 DIAGNOSIS — F203 Undifferentiated schizophrenia: Secondary | ICD-10-CM

## 2017-10-15 DIAGNOSIS — F23 Brief psychotic disorder: Secondary | ICD-10-CM | POA: Diagnosis present

## 2017-10-15 DIAGNOSIS — F209 Schizophrenia, unspecified: Secondary | ICD-10-CM | POA: Diagnosis present

## 2017-10-15 LAB — PROTIME-INR
INR: 2.85
Prothrombin Time: 29.7 seconds — ABNORMAL HIGH (ref 11.4–15.2)

## 2017-10-15 LAB — GLUCOSE, CAPILLARY
Glucose-Capillary: 97 mg/dL (ref 65–99)
Glucose-Capillary: 77 mg/dL (ref 65–99)

## 2017-10-15 MED ORDER — ACETAMINOPHEN 325 MG PO TABS: 650.0000 mg | ORAL_TABLET | Freq: Four times a day (QID) | ORAL | Status: DC | PRN

## 2017-10-15 MED ORDER — WARFARIN SODIUM 5 MG PO TABS
10.0000 mg | ORAL_TABLET | Freq: Every day | ORAL | Status: DC
  Administered 2017-10-17 (×2): 10 mg via ORAL
  Filled 2017-10-15 (×2): qty 1
  Filled 2017-10-15: qty 2
  Filled 2017-10-15: qty 1

## 2017-10-15 MED ORDER — MAGNESIUM HYDROXIDE 400 MG/5ML PO SUSP: 30.0000 mL | Freq: Every day | ORAL | Status: DC | PRN

## 2017-10-15 MED ORDER — WARFARIN - PHARMACIST DOSING INPATIENT
Freq: Every day | Status: DC
  Administered 2017-10-17 – 2017-10-20 (×3)

## 2017-10-15 MED ORDER — FLUTICASONE FUROATE-VILANTEROL 200-25 MCG/INH IN AEPB
1.0000 | INHALATION_SPRAY | Freq: Every day | RESPIRATORY_TRACT | Status: DC
  Administered 2017-10-15 – 2017-10-20 (×4): 1 via RESPIRATORY_TRACT
  Filled 2017-10-15: qty 28

## 2017-10-15 MED ORDER — ALUM & MAG HYDROXIDE-SIMETH 200-200-20 MG/5ML PO SUSP: 30.0000 mL | ORAL | Status: DC | PRN

## 2017-10-15 NOTE — Progress Notes (Signed)
Recreation Therapy Notes  INPATIENT RECREATION THERAPY ASSESSMENT  Patient Details Name: Lance [name] MRN: [id number] DOB: January 26, 1976 Today's Date: 10/15/2017       Information Obtained From: Patient  Able to Participate in Assessment/Interview: Yes  Patient Presentation: Responsive  Reason for Admission (Per Patient): Other (Comments)(I feel dangerous, it just comes upon me)  Patient Stressors:    Coping Skills:   Other (Comment)(Whatever I can do at the time)  Leisure Interests (2+):  (None)  Frequency of Recreation/Participation:    Awareness of Community Resources:     [employer]:     Current Use:    If no, Barriers?:    Expressed Interest in [employer] Information:    [place] of Residence:  Musician  Patient Main Form of Transportation: Therapist, music  Patient Strengths:  "Therapist, music if I know"  Patient Identified Areas of Improvement:  I do not know  Patient Goal for Hospitalization:  Stop having these feelings   Current SI (including self-harm):  No  Current HI:  No  Current AVH: No  Staff Intervention Plan: Group Attendance, Collaborate with Interdisciplinary Treatment Team  Consent to Intern Participation: N/A  Lance [name] 10/15/2017, 2:33 PM

## 2017-10-15 NOTE — [facility] Suicide Risk Assessment (Signed)
[facility] INPATIENT:  Family/Significant Other Suicide Prevention Education  Suicide Prevention Education:  Contact Attempts: Pt's guardian,[name], at [phone],  has been identified by the patient as the family member/significant other with whom the patient will be residing, and identified as the person(s) who will aid the patient in the event of a mental health crisis.  With written consent from the patient, two attempts were made to provide suicide prevention education, prior to and/or following the patient's discharge.  We were unsuccessful in providing suicide prevention education.  A suicide education pamphlet was given to the patient to share with family/significant other.  Date and time of first attempt: 10/15/17, 0800 Date and time of second attempt:   Heidi Dach, LCSW 10/15/2017, 10:14 AM

## 2017-10-15 NOTE — H&P (Signed)
Psychiatric Admission Assessment Adult  Patient Identification: Lance [name] MRN:  [id number] Date of Evaluation:  10/15/2017 Chief Complaint:  "Everything is falling apart." Principal Diagnosis: Schizophrenia (HCC) Diagnosis:   Patient Active Problem List   Diagnosis Date Noted  . Schizophrenia (HCC) [F20.9] 10/15/2017    Priority: High  . Schizophrenia, undifferentiated (HCC) [F20.3] 09/20/2017  . Tobacco use disorder [F17.200] 09/01/2016  . Schizoaffective disorder, bipolar type (HCC) [F25.0] 05/22/2016  . Diabetes mellitus without complication (HCC) [E11.9] 05/21/2016  . Hyperlipidemia [E78.5] 05/21/2016  . History of pulmonary embolism [Z86.711] 05/21/2016  . Chronic anticoagulation [Z79.01] 05/21/2016  . Hypertension [I10] 09/25/2015   History of Present Illness: 42 yo male admitted due to homicidal thoughts. HE is well known to the unit with past admissions. He was last inpatient and discharge on 09/24/17 with same medications. He was admitted at that time due to SI related to issues with his money at the groups home and distress about this. This time he presents stating that he is having thoughts of harming other people. Upon evaluation, he is very irritable because he has to get out of bed to interview. He states that he is here because "everything is falling apart.' He stats that he has been feeling more depressed recently and "I feel uncomfortable around people a lot. "He states that he has been having "thoughts and spells like I might hurt someone." He denies any plan or target but states, "I just get a feeling when I'm around certain people that I could attack them or hurt them." HE denies wanting to harm others at his group home or here in the hospital. He denies having a specific target, denies plan or desire to hurt others. He states that he has never hurt others in the past. He states that he also starting having visual hallucinations of "light flashing." He denies hearing  voices or feeling suicidal. He states that he is moving to a new group home in [place] at the end of the month. HE is not particularly excited about this but also does not really like his current group home. He states that he has been compliant with his medications and has not missed any doses. [facility]. He states taht he does not want to answer any more questions at this time. He does not know what would be helpful for him.   Associated Signs/Symptoms: Depression Symptoms:  depressed mood, (Hypo) Manic Symptoms:  Impulsivity, Anxiety Symptoms:  Excessive Worry, Psychotic Symptoms:  Denies AH or paranioa, Endorses VH PTSD Symptoms: Negative Total Time spent with patient: 45 minutes  Past Psychiatric History: History of schizophrenia vs. Schizoaffective disorder. He has lived in multiple group homes through the years. He has had multiple hospitalizations and medication trials. He [name]. He reports 1 suicide attempt at age 25. He reports past self harm by cutting.   Is the patient at risk to self? No.  Has the patient been a risk to self in the past 6 months? No.  Has the patient been a risk to self within the distant past? No.  Is the patient a risk to others? Yes.    Has the patient been a risk to others in the past 6 months? No.  Has the patient been a risk to others within the distant past? No.   Alcohol Screening: 1. How often do you have a drink containing alcohol?: Never 2. How many drinks containing alcohol do you have  on a typical day when you are drinking?: 1 or 2 3. How often do you have six or more drinks on one occasion?: Never AUDIT-C Score: 0 4. How often during the last year have you found that you were not able to stop drinking once you had started?: Never 5. How often during the last year have you failed to do what was normally expected from you becasue of drinking?: Never 6. How often during the last year have you  needed a first drink in the morning to get yourself going after a heavy drinking session?: Never 7. How often during the last year have you had a feeling of guilt of remorse after drinking?: Never 8. How often during the last year have you been unable to remember what happened the night before because you had been drinking?: Never 9. Have you or someone else been injured as a result of your drinking?: No 10. Has a relative or friend or a doctor or another health worker been concerned about your drinking or suggested you cut down?: No Alcohol Use Disorder Identification Test Final Score (AUDIT): 0 Intervention/Follow-up: AUDIT Score <7 follow-up not indicated Substance Abuse History in the last 12 months:  No. Consequences of Substance Abuse: Negative Previous Psychotropic Medications: Yes  Psychological Evaluations: Yes  Past Medical History:  Past Medical History:  Diagnosis Date  . Depression   . Diabetes mellitus without complication (HCC)   . Hyperlipidemia   . Hypertension   . Lupus anticoagulant disorder (HCC) 06/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  . PE (pulmonary thromboembolism) (HCC)   . Schizo affective schizophrenia (HCC)   . Supratherapeutic INR 11/19/2016   History reviewed. No pertinent surgical history. Family History:  Family History  Problem Relation Age of Onset  . CAD Mother   . CAD Sister    Family Psychiatric  History: Unknown Tobacco Screening: Have you used any form of tobacco in the last 30 days? (Cigarettes, Smokeless Tobacco, Cigars, and/or Pipes): Yes Tobacco use, Select all that apply: 5 or more cigarettes per day Are you interested in Tobacco Cessation Medications?: Yes, will notify MD for an order Counseled patient on smoking cessation including recognizing danger situations, developing coping skills and basic information about quitting provided: Yes Social History: He is originally from [place]. HE is not close to any family members. He went  to school until 8th grade. [employer] He worked various jobs through the years. He is on disability currently. No kids, not married. He has a guardian through DSS.  Social History   Substance and Sexual Activity  Alcohol Use No   Comment: occassionally     Social History   Substance and Sexual Activity  Drug Use Yes  . Types: Marijuana   Comment: Last use 11/29/16      Pain Medications: see PTA Prescriptions: see PTA Over the Counter: see PTA History of alcohol / drug use?: No history of alcohol / drug abuse Longest period of sobriety (when/how long): N/A Negative Consequences of Use: Personal relationships, Legal Name of Substance 1: Marijuana Name of Substance 2: Alcohol 2 - Last Use / Amount: one year ago                Allergies:   Allergies  Allergen Reactions  . Penicillins Other (See Comments)    Reaction: "lockjaw" Has patient had a PCN reaction causing immediate rash, facial/tongue/throat swelling, SOB or lightheadedness with hypotension: No Has patient had a PCN reaction causing severe rash  involving mucus membranes or skin necrosis: No Has patient had a PCN reaction that required hospitalization: No Has patient had a PCN reaction occurring within the last 10 years: No If all of the above answers are "NO", then may proceed with Cephalosporin use.    Lab Results:  Results for orders placed or performed during the hospital encounter of 10/14/17 (from the past 48 hour(s))  CBC with Differential/Platelet     Status: None   Collection Time: 10/14/17  7:28 PM  Result Value Ref Range   WBC 6.1 3.8 - 10.6 K/uL   RBC 5.09 4.40 - 5.90 MIL/uL   Hemoglobin 16.2 13.0 - 18.0 g/dL   HCT 16.1 09.6 - 04.5 %   MCV 91.7 80.0 - 100.0 fL   MCH 31.7 26.0 - 34.0 pg   MCHC 34.6 32.0 - 36.0 g/dL   RDW 40.9 81.1 - 91.4 %   Platelets 215 150 - 440 K/uL   Neutrophils Relative % 53 %   Neutro Abs 3.3 1.4 - 6.5 K/uL   Lymphocytes Relative 32 %   Lymphs Abs 1.9 1.0 - 3.6 K/uL    Monocytes Relative 14 %   Monocytes Absolute 0.8 0.2 - 1.0 K/uL   Eosinophils Relative 0 %   Eosinophils Absolute 0.0 0 - 0.7 K/uL   Basophils Relative 1 %   Basophils Absolute 0.0 0 - 0.1 K/uL    Comment: Performed at [facility], [address] [place], [place] [address]  Glucose, capillary     Status: None   Collection Time: 10/15/17  7:11 AM  Result Value Ref Range   Glucose-Capillary 97 65 - 99 mg/dL  Protime-INR     Status: Abnormal   Collection Time: 10/15/17  7:20 AM  Result Value Ref Range   Prothrombin Time 29.7 (H) 11.4 - 15.2 seconds   INR 2.85     Comment: Performed at [facility], [address] [place], [place] [address]    Blood Alcohol level:  Lab Results  Component Value Date   [facility] <10 10/13/2017   ETH <10 10/09/2017    Metabolic Disorder Labs:  Lab Results  Component Value Date   HGBA1C 4.7 (L) 09/21/2017   MPG 88.19 09/21/2017   MPG 105.41 03/03/2017   Lab Results  Component Value Date   PROLACTIN 2.0 (L) 08/31/2016   PROLACTIN 4.8 05/22/2016   Lab Results  Component Value Date   CHOL 119 09/22/2017   TRIG 277 (H) 09/22/2017   HDL 27 (L) 09/22/2017   CHOLHDL 4.4 09/22/2017   VLDL 55 (H) 09/22/2017   LDLCALC 37 09/22/2017   LDLCALC 59 03/03/2017    Current Medications: Current Facility-Administered Medications  Medication Dose Route Frequency Provider Last Rate Last Dose  . acetaminophen (TYLENOL) tablet 650 mg  650 mg Oral Q6H PRN Rickiya Picariello R, MD      . albuterol (PROVENTIL HFA;VENTOLIN HFA) 108 (90 Base) MCG/ACT inhaler 2 puff  2 puff Inhalation Q6H PRN Clapacs, [name], MD      . alum & mag hydroxide-simeth (MAALOX/MYLANTA) 200-200-20 MG/5ML suspension 30 mL  30 mL Oral Q4H PRN Reality Dejonge, Ileene Hutchinson, MD      . cloZAPine (CLOZARIL) tablet 500 mg  500 mg Oral QHS Clapacs, [name], MD   500 mg at 10/14/17 2158  . insulin detemir (LEVEMIR) injection 14 Units  14 Units Subcutaneous Daily Clapacs, [name], MD   14 Units at  10/15/17 1203  . ipratropium (ATROVENT) 0.03 %  nasal spray 2 spray  2 spray Each Nare QHS Clapacs, [name], MD   2 spray at 10/14/17 2156  . lisinopril (PRINIVIL,ZESTRIL) tablet 2.5 mg  2.5 mg Oral Daily Clapacs, [name], MD   2.5 mg at 10/15/17 1208  . lithium carbonate (ESKALITH) CR tablet 450 mg  450 mg Oral Q12H Clapacs, [name], MD   450 mg at 10/15/17 1207  . magnesium hydroxide (MILK OF MAGNESIA) suspension 30 mL  30 mL Oral Daily PRN Romario Tith R, MD      . metFORMIN (GLUCOPHAGE) tablet 500 mg  500 mg Oral BID WC Clapacs, [name], MD   500 mg at 10/15/17 1207  . mometasone-formoterol (DULERA) 200-5 MCG/ACT inhaler 2 puff  2 puff Inhalation BID Clapacs, [name], MD      . polyethylene glycol (MIRALAX / GLYCOLAX) packet 17 g  17 g Oral Daily Clapacs, [name], MD      . simvastatin (ZOCOR) tablet 40 mg  40 mg Oral q1800 Clapacs, [name], MD   40 mg at 10/14/17 1748  . traZODone (DESYREL) tablet 75 mg  75 mg Oral QHS Clapacs, [name], MD   75 mg at 10/14/17 2156  . warfarin (COUMADIN) tablet 10 mg  10 mg Oral q1800 Vivaan Helseth, Ileene Hutchinson, MD      . Warfarin - Pharmacist Dosing Inpatient   Does not apply q1800 Naji Mehringer, Ileene Hutchinson, MD       PTA Medications: Medications Prior to Admission  Medication Sig Dispense Refill Last Dose  . albuterol (PROVENTIL HFA;VENTOLIN HFA) 108 (90 Base) MCG/ACT inhaler Inhale 1-2 puffs into the lungs every 4 (four) hours as needed for wheezing or shortness of breath. 1 Inhaler 0 prn at prn  . buPROPion (WELLBUTRIN XL) 150 MG 24 hr tablet Take 1 tablet (150 mg total) by mouth daily. 30 tablet 0 unknown at unknown  . cloZAPine (CLOZARIL) 100 MG tablet Take 5 tablets (500 mg total) by mouth at bedtime. 150 tablet 0 unknown at unknown  . Fluticasone-Salmeterol (ADVAIR) 250-50 MCG/DOSE AEPB Inhale 1 puff into the lungs 2 (two) times daily.   unknown at unknown  . insulin detemir (LEVEMIR) 100 UNIT/ML injection Inject 14 Units into the skin every morning.   unknown at unknown  .  ipratropium (ATROVENT) 0.06 % nasal spray Place 1 spray into both nostrils at bedtime. 3 mL 0 unknown at unknown  . lisinopril (PRINIVIL,ZESTRIL) 2.5 MG tablet Take 2.5 mg by mouth daily.   unknown at unknown  . lithium carbonate (ESKALITH) 450 MG CR tablet Take 1 tablet (450 mg total) by mouth every 12 (twelve) hours. 60 tablet 0 unknown at unknown  . metformin (FORTAMET) 500 MG (OSM) 24 hr tablet Take 500 mg by mouth 2 (two) times daily with a meal.   unknown at unknown  . mometasone-formoterol (DULERA) 200-5 MCG/ACT AERO Inhale 2 puffs into the lungs 2 (two) times daily. 1 Inhaler 1 unknown at unknown  . omega-3 acid ethyl esters (LOVAZA) 1 g capsule Take 1 g by mouth daily.   unknown at unknown  . polyethylene glycol (MIRALAX / GLYCOLAX) packet Take 17 g by mouth daily. 30 each 0 prn at prn  . simvastatin (ZOCOR) 40 MG tablet Take 40 mg by mouth at bedtime.   unknown at unknown  . traZODone (DESYREL) 50 MG tablet Take 75 mg by mouth at bedtime.   unknown at unknown  . warfarin (COUMADIN) 10 MG tablet Take 10 mg by mouth daily.  unknown at unknown    Musculoskeletal: Strength & Muscle Tone: within normal limits Gait & Station: normal Patient leans: N/A  Psychiatric Specialty Exam: Physical Exam  ROS  Blood pressure 122/72, pulse 68, temperature 97.6 F (36.4 C), temperature source Oral, resp. rate 20, height  (1.778 m), weight 85.7 kg (189 lb), SpO2 100 %.Body mass index is 27.12 kg/m.  General Appearance: Casual  Eye Contact:  Fair  Speech:  Clear and Coherent  Volume:  Normal  Mood:  Irritable  Affect:  Congruent  Thought Process:  Coherent and Goal Directed  Orientation:  Full (Time, Place, and Person)  Thought Content:  Logical  Suicidal Thoughts:  No  Homicidal Thoughts:  Yes.  without intent/plan  Memory:  Immediate;   Fair  Judgement:  Impaired  Insight:  Lacking  Psychomotor Activity:  Normal  Concentration:  Concentration: Fair  Recall:  [employer] of  Knowledge:  Fair  Language:  Fair  Akathisia:  No      Assets:  Resilience  ADL's:  Intact  Cognition:  WNL  Sleep:  Number of Hours: 7.15    Treatment Plan Summary: 42 yo male admitted due thoughts of hurting others. He has history of multiple hospitalizations. Most recent one was due to dislike for his group home which I suspect may be going on currently and he is feeling stressed. He is allegedly moving to a new group home at the end of the month. He reports feelings of harming others but denies any desire to hurt others, denies plan or intent to hurt others. He is organized and goal directed and does not appear psychotic or manic.   Plan:  Schizophrenia -Continue Clozapine 500 mg qhs. Will check level -Continue Lithium 450 mg BID. Level 0.77 -will d/c Wellbutrin as may be causing some agitation and anxiety  Insomnia -Trazodone 100 mg qhs  DM -Metformin 500 mg BID -Levemir 14 units daily  HTN -Lisinopril 2.5 mg daily  H/O PE -Restart Coumadin 10 mg daily. INR 2.85 (goal 2-3)  Dispo -he will return to his group home on discharge. CSW spoke with guardian   Observation Level/Precautions:  15 minute checks  Laboratory:  Clozapine level  Psychotherapy:    Medications:    Consultations:    Discharge Concerns:    Estimated LOS: 3-5 days  Other:     Physician Treatment Plan for Primary Diagnosis: Schizophrenia (HCC) Long Term Goal(s): Improvement in symptoms so as ready for discharge  Short Term Goals: Ability to identify and develop effective coping behaviors will improve   I certify that inpatient services furnished can reasonably be expected to improve the patient's condition.    Haskell Riling, MD 5/3/20191:43 PM
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
## 2017-10-15 NOTE — [facility] Suicide Risk Assessment (Signed)
[facility] Admission Suicide Risk Assessment   Nursing information obtained from:  Patient(verbally denies SI) Demographic factors:  Male, Unemployed Current Mental Status:  NA Loss Factors:  NA Historical Factors:  NA Risk Reduction Factors:  Positive coping skills or problem solving skills  Total Time spent with patient: 45 minutes Principal Problem: Schizophrenia (HCC) Diagnosis:   Patient Active Problem List   Diagnosis Date Noted  . Schizophrenia (HCC) [F20.9] 10/15/2017    Priority: High  . Schizophrenia, undifferentiated (HCC) [F20.3] 09/20/2017  . Tobacco use disorder [F17.200] 09/01/2016  . Schizoaffective disorder, bipolar type (HCC) [F25.0] 05/22/2016  . Diabetes mellitus without complication (HCC) [E11.9] 05/21/2016  . Hyperlipidemia [E78.5] 05/21/2016  . History of pulmonary embolism [Z86.711] 05/21/2016  . Chronic anticoagulation [Z79.01] 05/21/2016  . Hypertension [I10] 09/25/2015   Subjective Data: See H&P  Continued Clinical Symptoms:  Alcohol Use Disorder Identification Test Final Score (AUDIT): 0 The "Alcohol Use Disorders Identification Test", Guidelines for Use in Primary Care, Second Edition.  World Science writer [facility]). Score between 0-7:  no or low risk or alcohol related problems. Score between 8-15:  moderate risk of alcohol related problems. Score between 16-19:  high risk of alcohol related problems. Score 20 or above:  warrants further diagnostic evaluation for alcohol dependence and treatment.   CLINICAL FACTORS:   More than one psychiatric diagnosis Previous Psychiatric Diagnoses and Treatments     COGNITIVE FEATURES THAT CONTRIBUTE TO RISK:  None    SUICIDE RISK:   Minimal: No identifiable suicidal ideation.  Patients presenting with no risk factors but with morbid ruminations; may be classified as minimal risk based on the severity of the depressive symptoms  PLAN OF CARE: See H&P  I certify that inpatient services furnished can  reasonably be expected to improve the patient's condition.   Haskell Riling, MD 10/15/2017, 2:12 PM

## 2017-10-15 NOTE — Progress Notes (Signed)
CSW contacted pt's guardian, [name], at  [phone], to discuss pt's admission and obtain fax number to send consents. Pt's guardian did not answer, so CSW left a voicemail asking her to call back ASAP. CSW will follow up as needed.   Heidi Dach, MSW, LCSW Clinical Social Worker 10/15/2017 8:09 AM

## 2017-10-15 NOTE — Progress Notes (Signed)
Recreation Therapy Notes  Date: 10/15/2017  Time: 9:30 am   Location: Craft Room   Behavioral response: N/A   Intervention Topic: Leisure  Discussion/Intervention: Patient did not attend group.   Clinical Observations/Feedback:  Patient did not attend group.   Tahesha Skeet LRT/CTRS         Beatris Belen 10/15/2017 10:25 AM

## 2017-10-15 NOTE — Tx Team (Addendum)
Interdisciplinary Treatment and Diagnostic Plan Update  10/20/2017 Time of Session: 1100 Lance [name] MRN: [id number]  Principal Diagnosis: Schizophrenia [facility])  Secondary Diagnoses: Principal Problem:   Schizophrenia (HCC)   Current Medications:  Current Facility-Administered Medications  Medication Dose Route Frequency Provider Last Rate Last Dose  . acetaminophen (TYLENOL) tablet 650 mg  650 mg Oral Q6H PRN McNew, Holly R, MD      . albuterol (PROVENTIL HFA;VENTOLIN HFA) 108 (90 Base) MCG/ACT inhaler 2 puff  2 puff Inhalation Q6H PRN Clapacs, [name], MD      . alum & mag hydroxide-simeth (MAALOX/MYLANTA) 200-200-20 MG/5ML suspension 30 mL  30 mL Oral Q4H PRN McNew, Ileene Hutchinson, MD      . cloZAPine (CLOZARIL) tablet 500 mg  500 mg Oral QHS Clapacs, [name], MD   500 mg at 10/19/17 2139  . fluticasone furoate-vilanterol (BREO ELLIPTA) 200-25 MCG/INH 1 puff  1 puff Inhalation Daily McNew, Ileene Hutchinson, MD   1 puff at 10/20/17 0826  . insulin detemir (LEVEMIR) injection 14 Units  14 Units Subcutaneous Daily Clapacs, [name], MD   14 Units at 10/20/17 [phone]  . ipratropium (ATROVENT) 0.03 % nasal spray 2 spray  2 spray Each Nare QHS Clapacs, [name], MD   2 spray at 10/17/17 2200  . lisinopril (PRINIVIL,ZESTRIL) tablet 2.5 mg  2.5 mg Oral Daily Clapacs, [name], MD   2.5 mg at 10/19/17 0853  . lithium carbonate (ESKALITH) CR tablet 450 mg  450 mg Oral Q12H Clapacs, [name], MD   450 mg at 10/20/17 0825  . magnesium hydroxide (MILK OF MAGNESIA) suspension 30 mL  30 mL Oral Daily PRN McNew, Holly R, MD      . metFORMIN (GLUCOPHAGE) tablet 500 mg  500 mg Oral BID WC Clapacs, [name], MD   500 mg at 10/20/17 0825  . polyethylene glycol (MIRALAX / GLYCOLAX) packet 17 g  17 g Oral Daily Clapacs, [name], MD   17 g at 10/19/17 0846  . simvastatin (ZOCOR) tablet 40 mg  40 mg Oral q1800 Clapacs, [name], MD   40 mg at 10/19/17 1752  . traZODone (DESYREL) tablet 75 mg  75 mg Oral QHS Clapacs, [name], MD   75 mg at  10/19/17 2138  . Warfarin - Pharmacist Dosing Inpatient   Does not apply q1800 McNew, Ileene Hutchinson, MD       PTA Medications: Medications Prior to Admission  Medication Sig Dispense Refill Last Dose  . albuterol (PROVENTIL HFA;VENTOLIN HFA) 108 (90 Base) MCG/ACT inhaler Inhale 1-2 puffs into the lungs every 4 (four) hours as needed for wheezing or shortness of breath. 1 Inhaler 0 prn at prn  . buPROPion (WELLBUTRIN XL) 150 MG 24 hr tablet Take 1 tablet (150 mg total) by mouth daily. 30 tablet 0 unknown at unknown  . cloZAPine (CLOZARIL) 100 MG tablet Take 5 tablets (500 mg total) by mouth at bedtime. 150 tablet 0 unknown at unknown  . Fluticasone-Salmeterol (ADVAIR) 250-50 MCG/DOSE AEPB Inhale 1 puff into the lungs 2 (two) times daily.   unknown at unknown  . insulin detemir (LEVEMIR) 100 UNIT/ML injection Inject 14 Units into the skin every morning.   unknown at unknown  . ipratropium (ATROVENT) 0.06 % nasal spray Place 1 spray into both nostrils at bedtime. 3 mL 0 unknown at unknown  . lisinopril (PRINIVIL,ZESTRIL) 2.5 MG tablet Take 2.5 mg by mouth daily.   unknown at unknown  . lithium carbonate (ESKALITH) 450  MG CR tablet Take 1 tablet (450 mg total) by mouth every 12 (twelve) hours. 60 tablet 0 unknown at unknown  . metformin (FORTAMET) 500 MG (OSM) 24 hr tablet Take 500 mg by mouth 2 (two) times daily with a meal.   unknown at unknown  . mometasone-formoterol (DULERA) 200-5 MCG/ACT AERO Inhale 2 puffs into the lungs 2 (two) times daily. 1 Inhaler 1 unknown at unknown  . omega-3 acid ethyl esters (LOVAZA) 1 g capsule Take 1 g by mouth daily.   unknown at unknown  . polyethylene glycol (MIRALAX / GLYCOLAX) packet Take 17 g by mouth daily. 30 each 0 prn at prn  . simvastatin (ZOCOR) 40 MG tablet Take 40 mg by mouth at bedtime.   unknown at unknown  . traZODone (DESYREL) 50 MG tablet Take 75 mg by mouth at bedtime.   unknown at unknown  . warfarin (COUMADIN) 10 MG tablet Take 10 mg by mouth daily.    unknown at unknown    Patient Stressors: Financial difficulties Health problems  Patient Strengths: Ability for insight Motivation for treatment/growth  Treatment Modalities: Medication Management, Group therapy, Case management,  1 to 1 session with clinician, Psychoeducation, Recreational therapy.   Physician Treatment Plan for Primary Diagnosis: Schizophrenia (HCC) Long Term Goal(s): Improvement in symptoms so as ready for discharge   Short Term Goals: Ability to identify and develop effective coping behaviors will improve  Medication Management: Evaluate patient's response, side effects, and tolerance of medication regimen.  Therapeutic Interventions: 1 to 1 sessions, Unit Group sessions and Medication administration.  Evaluation of Outcomes: Progressing  Physician Treatment Plan for Secondary Diagnosis: Principal Problem:   Schizophrenia (HCC)  Long Term Goal(s): Improvement in symptoms so as ready for discharge   Short Term Goals: Ability to identify and develop effective coping behaviors will improve     Medication Management: Evaluate patient's response, side effects, and tolerance of medication regimen.  Therapeutic Interventions: 1 to 1 sessions, Unit Group sessions and Medication administration.  Evaluation of Outcomes: Progressing   RN Treatment Plan for Primary Diagnosis: Schizophrenia (HCC) Long Term Goal(s): Knowledge of disease and therapeutic regimen to maintain health will improve  Short Term Goals: Ability to participate in decision making will improve, Ability to identify and develop effective coping behaviors will improve and Compliance with prescribed medications will improve  Medication Management: RN will administer medications as ordered by provider, will assess and evaluate patient's response and provide education to patient for prescribed medication. RN will report any adverse and/or side effects to prescribing provider.  Therapeutic  Interventions: 1 on 1 counseling sessions, Psychoeducation, Medication administration, Evaluate responses to treatment, Monitor vital signs and CBGs as ordered, Perform/monitor CIWA, COWS, AIMS and Fall Risk screenings as ordered, Perform wound care treatments as ordered.  Evaluation of Outcomes: Progressing   LCSW Treatment Plan for Primary Diagnosis: Schizophrenia (HCC) Long Term Goal(s): Safe transition to appropriate next level of care at discharge, Engage patient in therapeutic group addressing interpersonal concerns.  Short Term Goals: Engage patient in aftercare planning with referrals and resources, Increase emotional regulation and Increase skills for wellness and recovery  Therapeutic Interventions: Assess for all discharge needs, 1 to 1 time with Social worker, Explore available resources and support systems, Assess for adequacy in community support network, Educate family and significant other(s) on suicide prevention, Complete Psychosocial Assessment, Interpersonal group therapy.  Evaluation of Outcomes: Progressing   Progress in Treatment: Attending groups: No. Participating in groups: No. Taking medication as prescribed: Yes. Toleration medication:  Yes. Family/Significant other contact made: Yes, individual(s) contacted:  with pt's guardian. Patient understands diagnosis: Yes. Discussing patient identified problems/goals with staff: Yes. Medical problems stabilized or resolved: Yes. Denies suicidal/homicidal ideation: Yes. Issues/concerns per patient self-inventory: No. Other: None at this time.   New problem(s) identified: No, Describe:  none at this time.   New Short Term/Long Term Goal(s): Pt reported his goal for treatment is to, "stop having these feelings, and I guess to move where they want me to move."   Discharge Plan or Barriers: Pt will discharge back to his group home and will continue tx with his ACT Team.   Reason for Continuation of Hospitalization:  Depression Homicidal ideation Medication stabilization  Estimated Length of Stay:  3 days Recreational Therapy: Patient Stressors: N/A  Patient Goal: Patient will engage in groups without prompting or encouragement from LRT x3 group sessions within 5 recreation therapy group sessions   Attendees: Patient:   10/20/2017 11:15 AM  Physician: Dr. Johnella Moloney, MD  10/20/2017 11:15 AM  Nursing: Hulan Amato, RN 10/20/2017 11:15 AM  RN Care Manager: 10/20/2017 11:15 AM  Social Worker: Heidi Dach, LCSW 10/20/2017 11:15 AM  Recreational Therapist: Garret Reddish, CTRS-LRT 10/20/2017 11:15 AM  Other: Johny Shears, LCSWA 10/20/2017 11:15 AM  Other: Jake Shark ,LCSW 10/20/2017 11:15 AM  Other: 10/20/2017 11:15 AM      Scribe for Treatment Team: Heidi Dach, LCSW 10/20/2017 11:24 AM

## 2017-10-15 NOTE — [facility] Group Notes (Signed)

## 2017-10-15 NOTE — Plan of Care (Signed)
Pt has been pacing hallway. Pt verbalized "he didn't feel like answering the same damn questions". Pt isolative to self. Pt verbalized he is not si or in pain. Pt is medication compliant. Remains on Q 15 mins safety round. Will cont to monitor pt.  Problem: Education: Goal: Utilization of techniques to improve thought processes will improve Outcome: Progressing Goal: Knowledge of the prescribed therapeutic regimen will improve Outcome: Progressing   Problem: Activity: Goal: Interest or engagement in leisure activities will improve Outcome: Progressing Goal: Imbalance in normal sleep/wake cycle will improve Outcome: Progressing   Problem: Coping: Goal: Coping ability will improve Outcome: Progressing Goal: Will verbalize feelings Outcome: Progressing   Problem: Health Behavior/Discharge Planning: Goal: Ability to make decisions will improve Outcome: Progressing Goal: Compliance with therapeutic regimen will improve Outcome: Progressing   Problem: Role Relationship: Goal: Will demonstrate positive changes in social behaviors and relationships Outcome: Progressing   Problem: Safety: Goal: Ability to disclose and discuss suicidal ideas will improve Outcome: Progressing Goal: Ability to identify and utilize support systems that promote safety will improve Outcome: Progressing   Problem: Self-Concept: Goal: Will verbalize positive feelings about self Outcome: Progressing Goal: Level of anxiety will decrease Outcome: Progressing   Problem: Education: Goal: Knowledge of General Education information will improve Outcome: Progressing   Problem: Health Behavior/Discharge Planning: Goal: Ability to manage health-related needs will improve Outcome: Progressing   Problem: Clinical Measurements: Goal: Ability to maintain clinical measurements within normal limits will improve Outcome: Progressing Goal: Will remain free from infection Outcome: Progressing Goal: Diagnostic test  results will improve Outcome: Progressing Goal: Respiratory complications will improve Outcome: Progressing Goal: Cardiovascular complication will be avoided Outcome: Progressing   Problem: Activity: Goal: Risk for activity intolerance will decrease Outcome: Progressing   Problem: Nutrition: Goal: Adequate nutrition will be maintained Outcome: Progressing   Problem: Coping: Goal: Level of anxiety will decrease Outcome: Progressing   Problem: Elimination: Goal: Will not experience complications related to bowel motility Outcome: Progressing Goal: Will not experience complications related to urinary retention Outcome: Progressing   Problem: Pain Managment: Goal: General experience of comfort will improve Outcome: Progressing   Problem: Safety: Goal: Ability to remain free from injury will improve Outcome: Progressing   Problem: Skin Integrity: Goal: Risk for impaired skin integrity will decrease Outcome: Progressing

## 2017-10-15 NOTE — Progress Notes (Signed)
Received Lance [name] this AM in his room and woke him up for breakfast, he refused and refused to get up for his medications. He got up to eat lunch and returned to bed before taking his medications. He got up and was compliant with his medication and insulin. This afternoon he was OOB ambulating in the hallway and compliant with his BS check, it was 77. He ate well. He endorsed feeling irritated and denied all other psychiatric symptoms including feeling homicidal.

## 2017-10-16 DIAGNOSIS — F209 Schizophrenia, unspecified: Principal | ICD-10-CM

## 2017-10-16 LAB — PROTIME-INR
INR: 2.56
PROTHROMBIN TIME: 27.3 s — AB (ref 11.4–15.2)

## 2017-10-16 LAB — GLUCOSE, CAPILLARY
GLUCOSE-CAPILLARY: 95 mg/dL (ref 65–99)
Glucose-Capillary: 91 mg/dL (ref 65–99)
Glucose-Capillary: 162 mg/dL — ABNORMAL HIGH (ref 65–99)

## 2017-10-16 NOTE — [facility] Group Notes (Signed)
LCSW Group Therapy Note  10/16/2017 1:15pm  Type of Therapy and Topic: Group Therapy: Holding on to Grudges   Participation Level: Did Not Attend   Description of Group:  In this group patients will be asked to explore and define a grudge. Patients will be guided to discuss their thoughts, feelings, and reasons as to why people have grudges. Patients will process the impact grudges have on daily life and identify thoughts and feelings related to holding grudges. Facilitator will challenge patients to identify ways to let go of grudges and the benefits this provides. Patients will be confronted to address why one struggles letting go of grudges. Lastly, patients will identify feelings and thoughts related to what life would look like without grudges. This group will be process-oriented, with patients participating in exploration of their own experiences, giving and receiving support, and processing challenge from other group members.  Therapeutic Goals:  1. Patient will identify specific grudges related to their personal life.  2. Patient will identify feelings, thoughts, and beliefs around grudges.  3. Patient will identify how one releases grudges appropriately.  4. Patient will identify situations where they could have let go of the grudge, but instead chose to hold on.   Summary of Patient Progress:   Therapeutic Modalities:  Cognitive Behavioral Therapy  Solution Focused Therapy  Motivational Interviewing  Brief Therapy   Irvin Lizama  CUEBAS-COLON, LCSW 10/16/2017 12:48 PM

## 2017-10-16 NOTE — Progress Notes (Signed)
[facility] MD Progress Note  10/16/2017 11:56 AM Lance [name]  MRN:  [id number] Subjective:  'I dont feel good, I feel like loosing my control" Pt reportedly irritable, isolative in his room, took meds. Pt reports feeling "giddy around other people", vague about paranoia, endorses anxiety and depression, denies SI/HI.  Principal Problem: Schizophrenia (HCC) Diagnosis:   Patient Active Problem List   Diagnosis Date Noted  . Schizophrenia (HCC) [F20.9] 10/15/2017  . Schizophrenia, undifferentiated (HCC) [F20.3] 09/20/2017  . Tobacco use disorder [F17.200] 09/01/2016  . Schizoaffective disorder, bipolar type (HCC) [F25.0] 05/22/2016  . Diabetes mellitus without complication (HCC) [E11.9] 05/21/2016  . Hyperlipidemia [E78.5] 05/21/2016  . History of pulmonary embolism [Z86.711] 05/21/2016  . Chronic anticoagulation [Z79.01] 05/21/2016  . Hypertension [I10] 09/25/2015   Total Time spent with patient: 30 minutes  Past Psychiatric History: no new info  Past Medical History:  Past Medical History:  Diagnosis Date  . Depression   . Diabetes mellitus without complication (HCC)   . Hyperlipidemia   . Hypertension   . Lupus anticoagulant disorder (HCC) 06/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  . PE (pulmonary thromboembolism) (HCC)   . Schizo affective schizophrenia (HCC)   . Supratherapeutic INR 11/19/2016   History reviewed. No pertinent surgical history. Family History:  Family History  Problem Relation Age of Onset  . CAD Mother   . CAD Sister    Family Psychiatric  History: no ne w info Social History:  Social History   Substance and Sexual Activity  Alcohol Use No   Comment: occassionally     Social History   Substance and Sexual Activity  Drug Use Yes  . Types: Marijuana   Comment: Last use 11/29/16    Social History   Socioeconomic History  . Marital status: Single    Spouse name: Not on file  . Number of children: Not on file  . Years of education: Not on  file  . Highest education level: Not on file  Occupational History  . Not on file  Social Needs  . Financial resource strain: Not on file  . Food insecurity:    Worry: Not on file    Inability: Not on file  . Transportation needs:    Medical: Not on file    Non-medical: Not on file  Tobacco Use  . Smoking status: Current Every Day Smoker    Packs/day: 1.00    Years: 23.00    Pack years: 23.00    Types: Cigarettes  . Smokeless tobacco: Never Used  . Tobacco comment: will provide material  Substance and Sexual Activity  . Alcohol use: No    Comment: occassionally  . Drug use: Yes    Types: Marijuana    Comment: Last use 11/29/16  . Sexual activity: Never    Birth control/protection: None    Comment: occasional marijuana- none recently  Lifestyle  . Physical activity:    Days per week: Not on file    Minutes per session: Not on file  . Stress: Not on file  Relationships  . Social connections:    Talks on phone: Not on file    Gets together: Not on file    Attends religious service: Not on file    Active member of club or organization: Not on file    Attends meetings of clubs or organizations: Not on file    Relationship status: Not on file  Other Topics Concern  . Not on file  Social  History Narrative   From a group home in [place]   Additional Social History:    Pain Medications: see PTA Prescriptions: see PTA Over the Counter: see PTA History of alcohol / drug use?: No history of alcohol / drug abuse Longest period of sobriety (when/how long): N/A Negative Consequences of Use: Personal relationships, Legal Name of Substance 1: Marijuana Name of Substance 2: Alcohol 2 - Last Use / Amount: one year ago                Sleep: Good  Appetite:  Fair  Current Medications: Current Facility-Administered Medications  Medication Dose Route Frequency Provider Last Rate Last Dose  . acetaminophen (TYLENOL) tablet 650 mg  650 mg Oral Q6H PRN McNew, Holly  R, MD      . albuterol (PROVENTIL HFA;VENTOLIN HFA) 108 (90 Base) MCG/ACT inhaler 2 puff  2 puff Inhalation Q6H PRN Clapacs, [name], MD      . alum & mag hydroxide-simeth (MAALOX/MYLANTA) 200-200-20 MG/5ML suspension 30 mL  30 mL Oral Q4H PRN McNew, Ileene Hutchinson, MD      . cloZAPine (CLOZARIL) tablet 500 mg  500 mg Oral QHS Clapacs, [name], MD   500 mg at 10/15/17 2200  . fluticasone furoate-vilanterol (BREO ELLIPTA) 200-25 MCG/INH 1 puff  1 puff Inhalation Daily McNew, Ileene Hutchinson, MD   1 puff at 10/15/17 1736  . insulin detemir (LEVEMIR) injection 14 Units  14 Units Subcutaneous Daily Clapacs, [name], MD   14 Units at 10/16/17 1126  . ipratropium (ATROVENT) 0.03 % nasal spray 2 spray  2 spray Each Nare QHS Clapacs, [name], MD   2 spray at 10/14/17 2156  . lisinopril (PRINIVIL,ZESTRIL) tablet 2.5 mg  2.5 mg Oral Daily Clapacs, [name], MD   2.5 mg at 10/16/17 0848  . lithium carbonate (ESKALITH) CR tablet 450 mg  450 mg Oral Q12H Clapacs, [name], MD   450 mg at 10/16/17 0848  . magnesium hydroxide (MILK OF MAGNESIA) suspension 30 mL  30 mL Oral Daily PRN McNew, Holly R, MD      . metFORMIN (GLUCOPHAGE) tablet 500 mg  500 mg Oral BID WC Clapacs, [name], MD   500 mg at 10/16/17 0848  . polyethylene glycol (MIRALAX / GLYCOLAX) packet 17 g  17 g Oral Daily Clapacs, [name], MD      . simvastatin (ZOCOR) tablet 40 mg  40 mg Oral q1800 Clapacs, [name], MD   40 mg at 10/15/17 1736  . traZODone (DESYREL) tablet 75 mg  75 mg Oral QHS Clapacs, [name], MD   75 mg at 10/15/17 2200  . warfarin (COUMADIN) tablet 10 mg  10 mg Oral q1800 McNew, Ileene Hutchinson, MD      . Warfarin - Pharmacist Dosing Inpatient   Does not apply q1800 Haskell Riling, MD        Lab Results:  Results for orders placed or performed during the hospital encounter of 10/14/17 (from the past 48 hour(s))  CBC with Differential/Platelet     Status: None   Collection Time: 10/14/17  7:28 PM  Result Value Ref Range   WBC 6.1 3.8 - 10.6 K/uL   RBC 5.09 4.40  - 5.90 MIL/uL   Hemoglobin 16.2 13.0 - 18.0 g/dL   HCT 40.9 81.1 - 91.4 %   MCV 91.7 80.0 - 100.0 fL   MCH 31.7 26.0 - 34.0 pg   MCHC 34.6 32.0 - 36.0 g/dL   RDW  12.8 11.5 - 14.5 %   Platelets 215 150 - 440 K/uL   Neutrophils Relative % 53 %   Neutro Abs 3.3 1.4 - 6.5 K/uL   Lymphocytes Relative 32 %   Lymphs Abs 1.9 1.0 - 3.6 K/uL   Monocytes Relative 14 %   Monocytes Absolute 0.8 0.2 - 1.0 K/uL   Eosinophils Relative 0 %   Eosinophils Absolute 0.0 0 - 0.7 K/uL   Basophils Relative 1 %   Basophils Absolute 0.0 0 - 0.1 K/uL    Comment: Performed at [facility], [address] [place], [place] [address]  Glucose, capillary     Status: None   Collection Time: 10/15/17  7:11 AM  Result Value Ref Range   Glucose-Capillary 97 65 - 99 mg/dL  Protime-INR     Status: Abnormal   Collection Time: 10/15/17  7:20 AM  Result Value Ref Range   Prothrombin Time 29.7 (H) 11.4 - 15.2 seconds   INR 2.85     Comment: Performed at [facility], [address] [place], [place] [address]  Glucose, capillary     Status: None   Collection Time: 10/15/17  4:06 PM  Result Value Ref Range   Glucose-Capillary 77 65 - 99 mg/dL  Protime-INR     Status: Abnormal   Collection Time: 10/16/17  6:50 AM  Result Value Ref Range   Prothrombin Time 27.3 (H) 11.4 - 15.2 seconds   INR 2.56     Comment: Performed at [facility], [address] [place], [place] [address]  Glucose, capillary     Status: None   Collection Time: 10/16/17  7:09 AM  Result Value Ref Range   Glucose-Capillary 91 65 - 99 mg/dL   Comment 1 Document in Chart   Glucose, capillary     Status: None   Collection Time: 10/16/17 11:26 AM  Result Value Ref Range   Glucose-Capillary 95 65 - 99 mg/dL    Blood Alcohol level:  Lab Results  Component Value Date   ETH <10 10/13/2017   ETH <10 10/09/2017    Metabolic Disorder Labs: Lab Results  Component Value Date   HGBA1C 4.7 (L) 09/21/2017   MPG  88.19 09/21/2017   MPG 105.41 03/03/2017   Lab Results  Component Value Date   PROLACTIN 2.0 (L) 08/31/2016   PROLACTIN 4.8 05/22/2016   Lab Results  Component Value Date   CHOL 119 09/22/2017   TRIG 277 (H) 09/22/2017   HDL 27 (L) 09/22/2017   CHOLHDL 4.4 09/22/2017   VLDL 55 (H) 09/22/2017   LDLCALC 37 09/22/2017   LDLCALC 59 03/03/2017    Physical Findings: AIMS: Facial and Oral Movements Muscles of Facial Expression: None, normal Lips and Perioral Area: None, normal Jaw: None, normal Tongue: None, normal,Extremity Movements Upper (arms, wrists, hands, fingers): None, normal Lower (legs, knees, ankles, toes): None, normal, Trunk Movements Neck, shoulders, hips: None, normal, Overall Severity Severity of abnormal movements (highest score from questions above): None, normal Incapacitation due to abnormal movements: None, normal Patient's awareness of abnormal movements (rate only patient's report): No Awareness, Dental Status Current problems with teeth and/or dentures?: No Does patient usually wear dentures?: No  CIWA:    COWS:     Musculoskeletal: Strength & Muscle Tone: within normal limits Gait & Station: normal Patient leans:   Psychiatric Specialty Exam: Physical Exam  Nursing note and vitals reviewed.   ROS  Blood pressure 112/67, pulse 75, temperature (!) 97.5 F (36.4  C), temperature source Oral, resp. rate 20, height  (1.778 m), weight 85.7 kg (189 lb), SpO2 100 %.Body mass index is 27.12 kg/m.  General Appearance: Casual  Eye Contact:  Fair  Speech:  Clear and Coherent  Volume:  Normal  Mood:  Irritable  Affect:  labile  Thought Process:  Coherent and Goal Directed  Orientation:  Full (Time, Place, and Person)  Thought Content:  Logical  Suicidal Thoughts:  No  Homicidal Thoughts:  irritable and "giddy around other people"  , denies  Intent/plan, denies SI  Memory:  Immediate;   Fair  Judgement:  Impaired  Insight:  Lacking  Psychomotor  Activity:  Normal  Concentration:  Concentration: Fair  Recall:  [employer] of Knowledge:  Fair  Language:  Fair  Akathisia:  No      Assets:  Resilience  ADL's:  Intact  Cognition:  WNL  Sleep:  Number of Hours: 7.15        Treatment Plan Summary: Daily contact with patient to assess and evaluate symptoms and progress in treatment and Medication management. Pt irritable, risk of aggression.  Li level-  0.77.  clozaril level pending. ANC- 3.3. Cont clozaril and lithium.    Beverly Sessions, MD 10/16/2017, 11:56 AM

## 2017-10-16 NOTE — Plan of Care (Signed)
Patient is alert and oriented X 4. This morning patient denies self harming thoughts but affect is depressed. Patient appears very irritable and restless. Patient interaction is very minimal. Patient is compliant with medications, but refuses inhaler at this time. Patient's appetite is good, regular meals with snack. Patient received Levemir insulin around lunch time due to other administrations were done around lunch time previously, RN discussed with on call doctor. Patient denies pain at this time. Nurse will continue to monitor; 15 minutes safety checks to continue. Problem: Education: Goal: Utilization of techniques to improve thought processes will improve Outcome: Progressing Goal: Knowledge of the prescribed therapeutic regimen will improve Outcome: Progressing   Problem: Activity: Goal: Interest or engagement in leisure activities will improve Outcome: Progressing Goal: Imbalance in normal sleep/wake cycle will improve Outcome: Progressing   Problem: Coping: Goal: Coping ability will improve Outcome: Progressing Goal: Will verbalize feelings Outcome: Progressing   Problem: Health Behavior/Discharge Planning: Goal: Ability to make decisions will improve Outcome: Progressing Goal: Compliance with therapeutic regimen will improve Outcome: Progressing

## 2017-10-16 NOTE — Progress Notes (Signed)
ANTICOAGULATION CONSULT NOTE - Initial Consult  Pharmacy Consult for Warfarin Indication: Hx Pulmonary embolus  Allergies  Allergen Reactions  . Penicillins Other (See Comments)    Reaction: "lockjaw" Has patient had a PCN reaction causing immediate rash, facial/tongue/throat swelling, SOB or lightheadedness with hypotension: No Has patient had a PCN reaction causing severe rash involving mucus membranes or skin necrosis: No Has patient had a PCN reaction that required hospitalization: No Has patient had a PCN reaction occurring within the last 10 years: No If all of the above answers are "NO", then may proceed with Cephalosporin use.     Patient Measurements: Height:  (177.8 cm) Weight: 189 lb (85.7 kg) IBW/kg (Calculated) : 73 Heparin Dosing Weight:   Vital Signs: Temp: 97.5 F (36.4 C) (05/04 0626) Temp Source: Oral (05/04 0626) BP: 112/67 (05/04 0627) Pulse Rate: 75 (05/04 0627)  Labs: Recent Labs    10/13/17 1909 10/14/17 1928 10/15/17 0720 10/16/17 0650  HGB 16.7 16.2  --   --   HCT 47.3 46.7  --   --   PLT 224 215  --   --   LABPROT  --   --  29.7* 27.3*  INR  --   --  2.85 2.56  CREATININE 1.06  --   --   --     Estimated Creatinine Clearance: 92.8 mL/min (by C-G formula based on SCr of 1.06 mg/dL).   Medical History: Past Medical History:  Diagnosis Date  . Depression   . Diabetes mellitus without complication (HCC)   . Hyperlipidemia   . Hypertension   . Lupus anticoagulant disorder (HCC) 06/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  . PE (pulmonary thromboembolism) (HCC)   . Schizo affective schizophrenia (HCC)   . Supratherapeutic INR 11/19/2016    Medications:  Scheduled:  . cloZAPine  500 mg Oral QHS  . fluticasone furoate-vilanterol  1 puff Inhalation Daily  . insulin detemir  14 Units Subcutaneous Daily  . ipratropium  2 spray Each Nare QHS  . lisinopril  2.5 mg Oral Daily  . lithium carbonate  450 mg Oral Q12H  .  metFORMIN  500 mg Oral BID WC  . polyethylene glycol  17 g Oral Daily  . simvastatin  40 mg Oral q1800  . traZODone  75 mg Oral QHS  . warfarin  10 mg Oral q1800  . Warfarin - Pharmacist Dosing Inpatient   Does not apply q1800    Assessment: 42 yo male on Warfarin for hx Pulmonary Embolism Home dose: Warfarin 10 mg daily  5/3 INR 2.85  Dose not given per eMAR 5/4 INR 2.56  Goal of Therapy:  INR 2-3 Monitor platelets by anticoagulation protocol: Yes   Plan:  Will continue home regimen of warfarin 10 mg daily F/u INR daily  Tatyanna Cronk A 10/16/2017,11:00 AM

## 2017-10-17 LAB — GLUCOSE, CAPILLARY
GLUCOSE-CAPILLARY: 132 mg/dL — AB (ref 65–99)
GLUCOSE-CAPILLARY: 161 mg/dL — AB (ref 65–99)
Glucose-Capillary: 109 mg/dL — ABNORMAL HIGH (ref 65–99)

## 2017-10-17 LAB — PROTIME-INR
INR: 2.18
Prothrombin Time: 24.1 seconds — ABNORMAL HIGH (ref 11.4–15.2)

## 2017-10-17 NOTE — Progress Notes (Signed)
ANTICOAGULATION CONSULT NOTE -  Pharmacy Consult for Warfarin Indication: Hx Pulmonary embolus  Allergies  Allergen Reactions  . Penicillins Other (See Comments)    Reaction: "lockjaw" Has patient had a PCN reaction causing immediate rash, facial/tongue/throat swelling, SOB or lightheadedness with hypotension: No Has patient had a PCN reaction causing severe rash involving mucus membranes or skin necrosis: No Has patient had a PCN reaction that required hospitalization: No Has patient had a PCN reaction occurring within the last 10 years: No If all of the above answers are "NO", then may proceed with Cephalosporin use.     Patient Measurements: Height:  (177.8 cm) Weight: 189 lb (85.7 kg) IBW/kg (Calculated) : 73 Heparin Dosing Weight:   Vital Signs: Temp: 97.3 F (36.3 C) (05/05 0632) Temp Source: Oral (05/05 1610) BP: 101/71 (05/05 9604) Pulse Rate: 83 (05/05 0632)  Labs: Recent Labs    10/14/17 1928 10/15/17 0720 10/16/17 0650 10/17/17 0621  HGB 16.2  --   --   --   HCT 46.7  --   --   --   PLT 215  --   --   --   LABPROT  --  29.7* 27.3* 24.1*  INR  --  2.85 2.56 2.18    Estimated Creatinine Clearance: 92.8 mL/min (by C-G formula based on SCr of 1.06 mg/dL).   Medical History: Past Medical History:  Diagnosis Date  . Depression   . Diabetes mellitus without complication (HCC)   . Hyperlipidemia   . Hypertension   . Lupus anticoagulant disorder (HCC) 06/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  . PE (pulmonary thromboembolism) (HCC)   . Schizo affective schizophrenia (HCC)   . Supratherapeutic INR 11/19/2016    Medications:  Scheduled:  . cloZAPine  500 mg Oral QHS  . fluticasone furoate-vilanterol  1 puff Inhalation Daily  . insulin detemir  14 Units Subcutaneous Daily  . ipratropium  2 spray Each Nare QHS  . lisinopril  2.5 mg Oral Daily  . lithium carbonate  450 mg Oral Q12H  . metFORMIN  500 mg Oral BID WC  . polyethylene glycol   17 g Oral Daily  . simvastatin  40 mg Oral q1800  . traZODone  75 mg Oral QHS  . warfarin  10 mg Oral q1800  . Warfarin - Pharmacist Dosing Inpatient   Does not apply q1800    Assessment: 42 yo male on Warfarin for hx Pulmonary Embolism Home dose: Warfarin 10 mg daily  5/3 INR 2.85  Dose not given per eMAR 5/4 INR 2.56  Dose not given per eMAR 5/5 INR 2.18   Goal of Therapy:  INR 2-3 Monitor platelets by anticoagulation protocol: Yes   Plan:  Will continue home regimen of warfarin 10 mg daily F/u INR daily  Constantina Laseter A 10/17/2017,9:37 AM

## 2017-10-17 NOTE — Plan of Care (Signed)
  Problem: Safety: Goal: Ability to remain free from injury will improve Outcome: Progressing  Patient is safe on the unit free from injury and his thought process are organized no bizarre behavior noted.

## 2017-10-17 NOTE — Plan of Care (Signed)
Patient is alert and oriented X 4. Patient denies HI; AVH and SI today. Patient interaction with staff and peers is very minimal. Patient will come out of his room and pace the hallway and stand at doors, patient declines help with ADLs; and supplies. Patient affect is flat. Patient continues to be compliant with medications. Pharmacist called to make sure information is passed on to night shift about late medications. If medications does not arrive on unit in time for day shift please make sure medication is given when medication makes it to the unit. RN will give information to oncoming shift in report. Patients Warfarin has been missed for several days. As of now patient is within therapeutic range. Nurse will continue to monitor.

## 2017-10-17 NOTE — Progress Notes (Signed)
Patient is alert and oriented x 4, denies pain or discomfort, demes SI/HI/AVH. Patient's affect is flat but brightens upon approach. Patient's  thoughts are organized, patient spoke with Clinical research associate that [facility], he stated " I believe l will do better because its long term"  Writer encouraged patient to express feelings to HCP during the morning rounds. Patient was offered support and encouragement, he was complaint with medications, attended evening wrap up group and interacting appropriately with peers and staff. 15 minutes safety round check maintained,will continue to monitor.

## 2017-10-17 NOTE — [facility] Group Notes (Signed)
LCSW Group Therapy Note 10/17/2017 1:15pm  Type of Therapy and Topic: Group Therapy: Feelings Around Returning Home & Establishing a Supportive Framework and Supporting Oneself When Supports Not Available  Participation Level: Did Not Attend  Description of Group:  Patients first processed thoughts and feelings about upcoming discharge. These included fears of upcoming changes, lack of change, new living environments, judgements and expectations from others and overall stigma of mental health issues. The group then discussed the definition of a supportive framework, what that looks and feels like, and how do to discern it from an unhealthy non-supportive network. The group identified different types of supports as well as what to do when your family/friends are less than helpful or unavailable  Therapeutic Goals  1. Patient will identify one healthy supportive network that they can use at discharge. 2. Patient will identify one factor of a supportive framework and how to tell it from an unhealthy network. 3. Patient able to identify one coping skill to use when they do not have positive supports from others. 4. Patient will demonstrate ability to communicate their needs through discussion and/or role plays.  Summary of Patient Progress:    Therapeutic Modalities Cognitive Behavioral Therapy Motivational Interviewing   Lance Fecteau  CUEBAS-COLON, LCSW 10/17/2017 12:56 PM 

## 2017-10-17 NOTE — Progress Notes (Addendum)
[facility] MD Progress Note  10/17/2017 12:07 PM Lance [name]  MRN:  [id number] Subjective:   Tired" Pt reportedly irritable, cursing the astaff during lab draw yesterday, pt reportedly staring and standing in front of nursing station. Pt took meds, Pt   isolative in his room, guarded,  Pt reports seeing "flashes of light and shapes of people" endorses anxiety and depression, denies intent or plan to hurt others.  Principal Problem: Schizophrenia (HCC) Diagnosis:   Patient Active Problem List   Diagnosis Date Noted  . Schizophrenia (HCC) [F20.9] 10/15/2017  . Schizophrenia, undifferentiated (HCC) [F20.3] 09/20/2017  . Tobacco use disorder [F17.200] 09/01/2016  . Schizoaffective disorder, bipolar type (HCC) [F25.0] 05/22/2016  . Diabetes mellitus without complication (HCC) [E11.9] 05/21/2016  . Hyperlipidemia [E78.5] 05/21/2016  . History of pulmonary embolism [Z86.711] 05/21/2016  . Chronic anticoagulation [Z79.01] 05/21/2016  . Hypertension [I10] 09/25/2015   Total Time spent with patient: 30 minutes  Past Psychiatric History: no new info  Past Medical History:  Past Medical History:  Diagnosis Date  . Depression   . Diabetes mellitus without complication (HCC)   . Hyperlipidemia   . Hypertension   . Lupus anticoagulant disorder (HCC) 06/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  . PE (pulmonary thromboembolism) (HCC)   . Schizo affective schizophrenia (HCC)   . Supratherapeutic INR 11/19/2016   History reviewed. No pertinent surgical history. Family History:  Family History  Problem Relation Age of Onset  . CAD Mother   . CAD Sister    Family Psychiatric  History: no ne w info Social History:  Social History   Substance and Sexual Activity  Alcohol Use No   Comment: occassionally     Social History   Substance and Sexual Activity  Drug Use Yes  . Types: Marijuana   Comment: Last use 11/29/16    Social History   Socioeconomic History  . Marital status:  Single    Spouse name: Not on file  . Number of children: Not on file  . Years of education: Not on file  . Highest education level: Not on file  Occupational History  . Not on file  Social Needs  . Financial resource strain: Not on file  . Food insecurity:    Worry: Not on file    Inability: Not on file  . Transportation needs:    Medical: Not on file    Non-medical: Not on file  Tobacco Use  . Smoking status: Current Every Day Smoker    Packs/day: 1.00    Years: 23.00    Pack years: 23.00    Types: Cigarettes  . Smokeless tobacco: Never Used  . Tobacco comment: will provide material  Substance and Sexual Activity  . Alcohol use: No    Comment: occassionally  . Drug use: Yes    Types: Marijuana    Comment: Last use 11/29/16  . Sexual activity: Never    Birth control/protection: None    Comment: occasional marijuana- none recently  Lifestyle  . Physical activity:    Days per week: Not on file    Minutes per session: Not on file  . Stress: Not on file  Relationships  . Social connections:    Talks on phone: Not on file    Gets together: Not on file    Attends religious service: Not on file    Active member of club or organization: Not on file    Attends meetings of clubs or organizations: Not on  file    Relationship status: Not on file  Other Topics Concern  . Not on file  Social History Narrative   From a group home in [place]   Additional Social History:    Pain Medications: see PTA Prescriptions: see PTA Over the Counter: see PTA History of alcohol / drug use?: No history of alcohol / drug abuse Longest period of sobriety (when/how long): N/A Negative Consequences of Use: Personal relationships, Legal Name of Substance 1: Marijuana Name of Substance 2: Alcohol 2 - Last Use / Amount: one year ago                Sleep: Good  Appetite:  Fair  Current Medications: Current Facility-Administered Medications  Medication Dose Route Frequency  Provider Last Rate Last Dose  . acetaminophen (TYLENOL) tablet 650 mg  650 mg Oral Q6H PRN McNew, Holly R, MD      . albuterol (PROVENTIL HFA;VENTOLIN HFA) 108 (90 Base) MCG/ACT inhaler 2 puff  2 puff Inhalation Q6H PRN Clapacs, [name], MD      . alum & mag hydroxide-simeth (MAALOX/MYLANTA) 200-200-20 MG/5ML suspension 30 mL  30 mL Oral Q4H PRN McNew, Ileene Hutchinson, MD      . cloZAPine (CLOZARIL) tablet 500 mg  500 mg Oral QHS Clapacs, [name], MD   500 mg at 10/16/17 2156  . fluticasone furoate-vilanterol (BREO ELLIPTA) 200-25 MCG/INH 1 puff  1 puff Inhalation Daily McNew, Ileene Hutchinson, MD   1 puff at 10/15/17 1736  . insulin detemir (LEVEMIR) injection 14 Units  14 Units Subcutaneous Daily Clapacs, [name], MD   14 Units at 10/17/17 1148  . ipratropium (ATROVENT) 0.03 % nasal spray 2 spray  2 spray Each Nare QHS Clapacs, [name], MD   2 spray at 10/16/17 2157  . lisinopril (PRINIVIL,ZESTRIL) tablet 2.5 mg  2.5 mg Oral Daily Clapacs, [name], MD   2.5 mg at 10/16/17 0848  . lithium carbonate (ESKALITH) CR tablet 450 mg  450 mg Oral Q12H Clapacs, [name], MD   450 mg at 10/17/17 0950  . magnesium hydroxide (MILK OF MAGNESIA) suspension 30 mL  30 mL Oral Daily PRN McNew, Holly R, MD      . metFORMIN (GLUCOPHAGE) tablet 500 mg  500 mg Oral BID WC Clapacs, [name], MD   500 mg at 10/17/17 0945  . polyethylene glycol (MIRALAX / GLYCOLAX) packet 17 g  17 g Oral Daily Clapacs, [name], MD      . simvastatin (ZOCOR) tablet 40 mg  40 mg Oral q1800 Clapacs, [name], MD   40 mg at 10/17/17 0952  . traZODone (DESYREL) tablet 75 mg  75 mg Oral QHS Clapacs, [name], MD   75 mg at 10/16/17 2156  . warfarin (COUMADIN) tablet 10 mg  10 mg Oral q1800 McNew, Ileene Hutchinson, MD   10 mg at 10/17/17 0950  . Warfarin - Pharmacist Dosing Inpatient   Does not apply q1800 McNew, Ileene Hutchinson, MD        Lab Results:  Results for orders placed or performed during the hospital encounter of 10/14/17 (from the past 48 hour(s))  Glucose, capillary      Status: None   Collection Time: 10/15/17  4:06 PM  Result Value Ref Range   Glucose-Capillary 77 65 - 99 mg/dL  Protime-INR     Status: Abnormal   Collection Time: 10/16/17  6:50 AM  Result Value Ref Range   Prothrombin Time 27.3 (H) 11.4 -  15.2 seconds   INR 2.56     Comment: Performed at [facility], [address] [place], [place] [address]  Glucose, capillary     Status: None   Collection Time: 10/16/17  7:09 AM  Result Value Ref Range   Glucose-Capillary 91 65 - 99 mg/dL   Comment 1 Document in Chart   Glucose, capillary     Status: None   Collection Time: 10/16/17 11:26 AM  Result Value Ref Range   Glucose-Capillary 95 65 - 99 mg/dL  Glucose, capillary     Status: Abnormal   Collection Time: 10/16/17  6:11 PM  Result Value Ref Range   Glucose-Capillary 162 (H) 65 - 99 mg/dL  Protime-INR     Status: Abnormal   Collection Time: 10/17/17  6:21 AM  Result Value Ref Range   Prothrombin Time 24.1 (H) 11.4 - 15.2 seconds   INR 2.18     Comment: Performed at [facility], [address] [place], [place] [address]  Glucose, capillary     Status: Abnormal   Collection Time: 10/17/17  7:21 AM  Result Value Ref Range   Glucose-Capillary 109 (H) 65 - 99 mg/dL  Glucose, capillary     Status: Abnormal   Collection Time: 10/17/17 11:47 AM  Result Value Ref Range   Glucose-Capillary 132 (H) 65 - 99 mg/dL    Blood Alcohol level:  Lab Results  Component Value Date   ETH <10 10/13/2017   ETH <10 10/09/2017    Metabolic Disorder Labs: Lab Results  Component Value Date   HGBA1C 4.7 (L) 09/21/2017   MPG 88.19 09/21/2017   MPG 105.41 03/03/2017   Lab Results  Component Value Date   PROLACTIN 2.0 (L) 08/31/2016   PROLACTIN 4.8 05/22/2016   Lab Results  Component Value Date   CHOL 119 09/22/2017   TRIG 277 (H) 09/22/2017   HDL 27 (L) 09/22/2017   CHOLHDL 4.4 09/22/2017   VLDL 55 (H) 09/22/2017   LDLCALC 37 09/22/2017   LDLCALC 59 03/03/2017     Physical Findings: AIMS: Facial and Oral Movements Muscles of Facial Expression: None, normal Lips and Perioral Area: None, normal Jaw: None, normal Tongue: None, normal,Extremity Movements Upper (arms, wrists, hands, fingers): None, normal Lower (legs, knees, ankles, toes): None, normal, Trunk Movements Neck, shoulders, hips: None, normal, Overall Severity Severity of abnormal movements (highest score from questions above): None, normal Incapacitation due to abnormal movements: None, normal Patient's awareness of abnormal movements (rate only patient's report): No Awareness, Dental Status Current problems with teeth and/or dentures?: No Does patient usually wear dentures?: No  CIWA:    COWS:     Musculoskeletal: Strength & Muscle Tone: within normal limits Gait & Station: normal Patient leans:   Psychiatric Specialty Exam: Physical Exam  Nursing note and vitals reviewed.   ROS  Blood pressure 100/68, pulse 83, temperature (!) 97.3 F (36.3 C), temperature source Oral, resp. rate 18, height  (1.778 m), weight 85.7 kg (189 lb), SpO2 100 %.Body mass index is 27.12 kg/m.  General Appearance: Casual  Eye Contact:  Fair  Speech:  Clear and Coherent  Volume:  Normal  Mood:  Irritable, anxious  Affect:  labile, irritable  Thought Process:  Coherent and Goal Directed  Orientation:  Full (Time, Place, and Person)  Thought Content:  VH  Suicidal Thoughts:  No  Homicidal Thoughts: Hi but no intent or plan,  denies SI  Memory:  Immediate;   Fair  Judgement:  Impaired  Insight:  Lacking  Psychomotor Activity:  Normal  Concentration:  Concentration: Fair  Recall:  [employer] of Knowledge:  Fair  Language:  Fair  Akathisia:  No      Assets:  Resilience  ADL's:  Intact  Cognition:  WNL  Sleep:  Number of Hours: 7.15        Treatment Plan Summary: Daily contact with patient to assess and evaluate symptoms and progress in treatment and Medication management.  Pt psychotic, irritable, risk of aggression.  Li level-  0.77.  clozaril level pending. ANC- 3.3. Cont clozaril and  Cont lithium.    Beverly Sessions, MD 10/17/2017, 12:07 PMPatient ID: Lance [name], male   DOB: [birth year], 42 y.o.   MRN: [id number]

## 2017-10-18 LAB — PROTIME-INR
INR: 2.49
Prothrombin Time: 26.7 seconds — ABNORMAL HIGH (ref 11.4–15.2)

## 2017-10-18 LAB — CLOZAPINE (CLOZARIL)
Clozapine Lvl: 402 ng/mL (ref 350–650)
NorClozapine: 150 ng/mL
Total(Cloz+Norcloz): 552 ng/mL

## 2017-10-18 LAB — GLUCOSE, CAPILLARY
Glucose-Capillary: 112 mg/dL — ABNORMAL HIGH (ref 65–99)
Glucose-Capillary: 98 mg/dL (ref 65–99)

## 2017-10-18 MED ORDER — WARFARIN SODIUM 5 MG PO TABS
5.0000 mg | ORAL_TABLET | Freq: Once | ORAL | Status: AC
  Administered 2017-10-18: 5 mg via ORAL
  Filled 2017-10-18: qty 1

## 2017-10-18 NOTE — Progress Notes (Signed)
[facility] MD Progress Note  10/18/2017 12:52 PM Lance [name]  MRN:  [id number] Subjective:  Pt is calm and pleasant with this provider today. He states, "I'm not feeling good. I feel agitated." He states that he is scared to hurt other people. He states that sometimes a feeling comes over him when he is around certain people. He states, "I don't want to hurt anyone. I just get really anxious around other people." These thoughts seem to stem from social anxiety. He denies any specific target, plan or intent to harm others. He denies homicidal ideation and suicidal ideation. HE is anxious about the move into a new group home. He states, "I don't want to hurt anyone and go to jail because that would not be good." He is not a violent person. He stats, "I guess if I feel like this I will tell someone or walk away from the situation. " HE processes that he does not like his current group home because "everyone just sleeps all day and its boring." He gets to go out 2 hours a day and tries to go downtown and walk around which is helpful. He is excited about a new move but anxious because he has to pack up his belongings. He states that he is going to start a mens group with ACT team and is willing to give it a try to help with coping skills. He is sleeping well and eating well. He feels depressed when thinking about his family members who have passed away over the years. He enjoys thinking of good memories of them.   Principal Problem: Schizophrenia (HCC) Diagnosis:   Patient Active Problem List   Diagnosis Date Noted  . Schizophrenia (HCC) [F20.9] 10/15/2017    Priority: High  . Schizophrenia, undifferentiated (HCC) [F20.3] 09/20/2017  . Tobacco use disorder [F17.200] 09/01/2016  . Schizoaffective disorder, bipolar type (HCC) [F25.0] 05/22/2016  . Diabetes mellitus without complication (HCC) [E11.9] 05/21/2016  . Hyperlipidemia [E78.5] 05/21/2016  . History of pulmonary embolism [Z86.711] 05/21/2016  .  Chronic anticoagulation [Z79.01] 05/21/2016  . Hypertension [I10] 09/25/2015   Total Time spent with patient: 20 minutes  Past Psychiatric History: See H&P  Past Medical History:  Past Medical History:  Diagnosis Date  . Depression   . Diabetes mellitus without complication (HCC)   . Hyperlipidemia   . Hypertension   . Lupus anticoagulant disorder (HCC) 06/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  . PE (pulmonary thromboembolism) (HCC)   . Schizo affective schizophrenia (HCC)   . Supratherapeutic INR 11/19/2016   History reviewed. No pertinent surgical history. Family History:  Family History  Problem Relation Age of Onset  . CAD Mother   . CAD Sister    Family Psychiatric  History: See H&P Social History:  Social History   Substance and Sexual Activity  Alcohol Use No   Comment: occassionally     Social History   Substance and Sexual Activity  Drug Use Yes  . Types: Marijuana   Comment: Last use 11/29/16    Social History   Socioeconomic History  . Marital status: Single    Spouse name: Not on file  . Number of children: Not on file  . Years of education: Not on file  . Highest education level: Not on file  Occupational History  . Not on file  Social Needs  . Financial resource strain: Not on file  . Food insecurity:    Worry: Not on file  Inability: Not on file  . Transportation needs:    Medical: Not on file    Non-medical: Not on file  Tobacco Use  . Smoking status: Current Every Day Smoker    Packs/day: 1.00    Years: 23.00    Pack years: 23.00    Types: Cigarettes  . Smokeless tobacco: Never Used  . Tobacco comment: will provide material  Substance and Sexual Activity  . Alcohol use: No    Comment: occassionally  . Drug use: Yes    Types: Marijuana    Comment: Last use 11/29/16  . Sexual activity: Never    Birth control/protection: None    Comment: occasional marijuana- none recently  Lifestyle  . Physical activity:    Days per  week: Not on file    Minutes per session: Not on file  . Stress: Not on file  Relationships  . Social connections:    Talks on phone: Not on file    Gets together: Not on file    Attends religious service: Not on file    Active member of club or organization: Not on file    Attends meetings of clubs or organizations: Not on file    Relationship status: Not on file  Other Topics Concern  . Not on file  Social History Narrative   From a group home in [place]   Additional Social History:    Pain Medications: see PTA Prescriptions: see PTA Over the Counter: see PTA History of alcohol / drug use?: No history of alcohol / drug abuse Longest period of sobriety (when/how long): N/A Negative Consequences of Use: Personal relationships, Legal Name of Substance 1: Marijuana Name of Substance 2: Alcohol 2 - Last Use / Amount: one year ago                Sleep: Good  Appetite:  Good  Current Medications: Current Facility-Administered Medications  Medication Dose Route Frequency Provider Last Rate Last Dose  . acetaminophen (TYLENOL) tablet 650 mg  650 mg Oral Q6H PRN Ruie Sendejo R, MD      . albuterol (PROVENTIL HFA;VENTOLIN HFA) 108 (90 Base) MCG/ACT inhaler 2 puff  2 puff Inhalation Q6H PRN Clapacs, [name], MD      . alum & mag hydroxide-simeth (MAALOX/MYLANTA) 200-200-20 MG/5ML suspension 30 mL  30 mL Oral Q4H PRN Chianti Goh, Ileene Hutchinson, MD      . cloZAPine (CLOZARIL) tablet 500 mg  500 mg Oral QHS Clapacs, [name], MD   500 mg at 10/17/17 2117  . fluticasone furoate-vilanterol (BREO ELLIPTA) 200-25 MCG/INH 1 puff  1 puff Inhalation Daily Clemmie Buelna, Ileene Hutchinson, MD   1 puff at 10/18/17 0748  . insulin detemir (LEVEMIR) injection 14 Units  14 Units Subcutaneous Daily Clapacs, [name], MD   14 Units at 10/18/17 0747  . ipratropium (ATROVENT) 0.03 % nasal spray 2 spray  2 spray Each Nare QHS Clapacs, [name], MD   2 spray at 10/17/17 2200  . lisinopril (PRINIVIL,ZESTRIL) tablet 2.5 mg  2.5 mg  Oral Daily Clapacs, [name], MD   2.5 mg at 10/18/17 0747  . lithium carbonate (ESKALITH) CR tablet 450 mg  450 mg Oral Q12H Clapacs, [name], MD   450 mg at 10/18/17 0747  . magnesium hydroxide (MILK OF MAGNESIA) suspension 30 mL  30 mL Oral Daily PRN Isair Inabinet R, MD      . metFORMIN (GLUCOPHAGE) tablet 500 mg  500 mg Oral BID WC Clapacs, [name],  MD   500 mg at 10/18/17 0749  . polyethylene glycol (MIRALAX / GLYCOLAX) packet 17 g  17 g Oral Daily Clapacs, [name], MD      . simvastatin (ZOCOR) tablet 40 mg  40 mg Oral q1800 Clapacs, [name], MD   40 mg at 10/17/17 0952  . traZODone (DESYREL) tablet 75 mg  75 mg Oral QHS Clapacs, [name], MD   75 mg at 10/17/17 2116  . warfarin (COUMADIN) tablet 5 mg  5 mg Oral ONCE-1800 Jameson Morrow, Ileene Hutchinson, MD      . Warfarin - Pharmacist Dosing Inpatient   Does not apply q1800 Eyva Califano, Ileene Hutchinson, MD        Lab Results:  Results for orders placed or performed during the hospital encounter of 10/14/17 (from the past 48 hour(s))  Glucose, capillary     Status: Abnormal   Collection Time: 10/16/17  6:11 PM  Result Value Ref Range   Glucose-Capillary 162 (H) 65 - 99 mg/dL  Protime-INR     Status: Abnormal   Collection Time: 10/17/17  6:21 AM  Result Value Ref Range   Prothrombin Time 24.1 (H) 11.4 - 15.2 seconds   INR 2.18     Comment: Performed at [facility], [address] [place], [place] [address]  Glucose, capillary     Status: Abnormal   Collection Time: 10/17/17  7:21 AM  Result Value Ref Range   Glucose-Capillary 109 (H) 65 - 99 mg/dL  Glucose, capillary     Status: Abnormal   Collection Time: 10/17/17 11:47 AM  Result Value Ref Range   Glucose-Capillary 132 (H) 65 - 99 mg/dL  Glucose, capillary     Status: Abnormal   Collection Time: 10/17/17  4:58 PM  Result Value Ref Range   Glucose-Capillary 161 (H) 65 - 99 mg/dL  Protime-INR     Status: Abnormal   Collection Time: 10/18/17  6:52 AM  Result Value Ref Range   Prothrombin Time 26.7 (H)  11.4 - 15.2 seconds   INR 2.49     Comment: Performed at [facility], [address] [place], [place] [address]  Glucose, capillary     Status: Abnormal   Collection Time: 10/18/17  7:09 AM  Result Value Ref Range   Glucose-Capillary 112 (H) 65 - 99 mg/dL    Blood Alcohol level:  Lab Results  Component Value Date   ETH <10 10/13/2017   ETH <10 10/09/2017    Metabolic Disorder Labs: Lab Results  Component Value Date   HGBA1C 4.7 (L) 09/21/2017   MPG 88.19 09/21/2017   MPG 105.41 03/03/2017   Lab Results  Component Value Date   PROLACTIN 2.0 (L) 08/31/2016   PROLACTIN 4.8 05/22/2016   Lab Results  Component Value Date   CHOL 119 09/22/2017   TRIG 277 (H) 09/22/2017   HDL 27 (L) 09/22/2017   CHOLHDL 4.4 09/22/2017   VLDL 55 (H) 09/22/2017   LDLCALC 37 09/22/2017   LDLCALC 59 03/03/2017    Physical Findings: AIMS: Facial and Oral Movements Muscles of Facial Expression: None, normal Lips and Perioral Area: None, normal Jaw: None, normal Tongue: None, normal,Extremity Movements Upper (arms, wrists, hands, fingers): None, normal Lower (legs, knees, ankles, toes): None, normal, Trunk Movements Neck, shoulders, hips: None, normal, Overall Severity Severity of abnormal movements (highest score from questions above): None, normal Incapacitation due to abnormal movements: None, normal Patient's awareness of abnormal movements (rate only patient's report): No Awareness, Dental Status Current  problems with teeth and/or dentures?: No Does patient usually wear dentures?: No  CIWA:    COWS:     Musculoskeletal: Strength & Muscle Tone: within normal limits Gait & Station: normal Patient leans: N/A  Psychiatric Specialty Exam: Physical Exam  ROS  Blood pressure 124/74, pulse 87, temperature 97.7 F (36.5 C), temperature source Oral, resp. rate 18, height  (1.778 m), weight 85.7 kg (189 lb), SpO2 100 %.Body mass index is 27.12 kg/m.  General  Appearance: Casual  Eye Contact:  Minimal  Speech:  Clear and Coherent  Volume:  Normal  Mood:  Depressed  Affect:  Appropriate  Thought Process:  Coherent and Goal Directed  Orientation:  Full (Time, Place, and Person)  Thought Content:  Logical  Suicidal Thoughts:  No  Homicidal Thoughts:  No  Memory:  Immediate;   fair  Judgement:  Fair  Insight:  Fair  Psychomotor Activity:  Normal  Concentration:  Concentration: Fair  Recall:  [employer] of Knowledge:  Fair  Language:  Fair  Akathisia:  No      Assets:  Resilience  ADL's:  Intact  Cognition:  WNL  Sleep:  Number of Hours: 10     Treatment Plan Summary: 42 yo male admitted due to HI. Pt reports a lot of anxiety around socializing causing him to have intrusive thought of possibly hurting someone impulsively. These thoughts are very ego dystonic. He is not a violent person. He does not have any plan, intention or target to harm anyone. A lot of his stress surrounds his dislike for his group home and is excited to move on. He denies SI. He needs to work on coping skills to help deal with anxiety around other people.   Plan:  Schizophrenia -Continue Clozapine 500 mg qhs. Level pending -Continue Lithium 450 mg BID -Will Consider SSRI for anxiety and intrusive thoughts. Attempted to contact guardian but no answer  Insomnia -Trazodone 100 mg qhs  HTN -BP stable -Continue Lisinopril 2.5 mg daily  H/O PE -pharmacy following daily, checking daily INR -Will get 5 mg today as he received 2 doses yesterday  Dispo -He will return to his group home on discharge and follow up with ACT team    Haskell Riling, MD 10/18/2017, 12:52 PM

## 2017-10-18 NOTE — [facility] Group Notes (Signed)
[facility] Group Notes:  (Nursing/MHT/Case Management/Adjunct)  Date:  10/18/2017  Time:  9:01 PM  Type of Therapy:  Group Therapy  Participation Level:  Active  Participation Quality:  Standing in the hall.  Affect:  Appropriate  Cognitive:  Appropriate  Insight:  Good  Engagement in Group:  Engaged  Modes of Intervention:  Support  Summary of Progress/Problems:  Lance [name] 10/18/2017, 9:01 PM

## 2017-10-18 NOTE — Progress Notes (Signed)
Recreation Therapy Notes  Date: 10/18/2017  Time: 9:30 am   Location: Craft Room   Behavioral response: N/A   Intervention Topic: Problem Solving  Discussion/Intervention: Patient did not attend group.   Clinical Observations/Feedback:  Patient did not attend group.   Lashonta Pilling LRT/CTRS         Dayton Kenley 10/18/2017 10:22 AM

## 2017-10-18 NOTE — Plan of Care (Signed)
Patient was little irritable this morning.Writer asked about how he was doing states "I can not say anything now."Later today patient states "I am ok now.I will stay in my room when I am more anxious."him.Compliant with medications.Attended some groups.Appetite and energy level good.Support and encouragement given.

## 2017-10-18 NOTE — Progress Notes (Signed)
Patient ID: Lance [name], male   DOB: 1976-03-29, 42 y.o.   MRN: [id number] PER STATE REGULATIONS 482.30  THIS CHART WAS REVIEWED FOR MEDICAL NECESSITY WITH RESPECT TO THE PATIENT'S ADMISSION/ DURATION OF STAY.  NEXT REVIEW DATE: 10/22/2017 Willa Rough, RN, BSN CASE MANAGER

## 2017-10-18 NOTE — Progress Notes (Signed)
ANTICOAGULATION CONSULT NOTE -  Pharmacy Consult for Warfarin Indication: Hx Pulmonary embolus  Allergies  Allergen Reactions  . Penicillins Other (See Comments)    Reaction: "lockjaw" Has patient had a PCN reaction causing immediate rash, facial/tongue/throat swelling, SOB or lightheadedness with hypotension: No Has patient had a PCN reaction causing severe rash involving mucus membranes or skin necrosis: No Has patient had a PCN reaction that required hospitalization: No Has patient had a PCN reaction occurring within the last 10 years: No If all of the above answers are "NO", then may proceed with Cephalosporin use.     Patient Measurements: Height:  (177.8 cm) Weight: 189 lb (85.7 kg) IBW/kg (Calculated) : 73 Heparin Dosing Weight:   Vital Signs: Temp: 97.7 F (36.5 C) (05/06 0624) Temp Source: Oral (05/06 0624) BP: 124/74 (05/06 0626) Pulse Rate: 87 (05/06 0626)  Labs: Recent Labs    10/16/17 0650 10/17/17 0621 10/18/17 0652  LABPROT 27.3* 24.1* 26.7*  INR 2.56 2.18 2.49    Estimated Creatinine Clearance: 92.8 mL/min (by C-G formula based on SCr of 1.06 mg/dL).   Medical History: Past Medical History:  Diagnosis Date  . Depression   . Diabetes mellitus without complication (HCC)   . Hyperlipidemia   . Hypertension   . Lupus anticoagulant disorder (HCC) 06/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  . PE (pulmonary thromboembolism) (HCC)   . Schizo affective schizophrenia (HCC)   . Supratherapeutic INR 11/19/2016    Medications:  Scheduled:  . cloZAPine  500 mg Oral QHS  . fluticasone furoate-vilanterol  1 puff Inhalation Daily  . insulin detemir  14 Units Subcutaneous Daily  . ipratropium  2 spray Each Nare QHS  . lisinopril  2.5 mg Oral Daily  . lithium carbonate  450 mg Oral Q12H  . metFORMIN  500 mg Oral BID WC  . polyethylene glycol  17 g Oral Daily  . simvastatin  40 mg Oral q1800  . traZODone  75 mg Oral QHS  . warfarin  10 mg  Oral q1800  . Warfarin - Pharmacist Dosing Inpatient   Does not apply q1800    Assessment: 42 yo male on Warfarin for hx Pulmonary Embolism Home dose: Warfarin 10 mg daily  5/3 INR 2.85  Dose not given per eMAR 5/4 INR 2.56  Dose not given per eMAR 5/5 INR 2.18  Two doses given per eMAR- total 20 mg 5/6 INR 2.49   Goal of Therapy:  INR 2-3 Monitor platelets by anticoagulation protocol: Yes   Plan:  Patient got two doses charted yesterday so will only give  today and follow up. F/u INR daily  Abiha Lukehart K 10/18/2017,9:01 AM

## 2017-10-19 LAB — GLUCOSE, CAPILLARY
GLUCOSE-CAPILLARY: 108 mg/dL — AB (ref 65–99)
GLUCOSE-CAPILLARY: 124 mg/dL — AB (ref 65–99)
GLUCOSE-CAPILLARY: 95 mg/dL (ref 65–99)
Glucose-Capillary: 94 mg/dL (ref 65–99)
Glucose-Capillary: 111 mg/dL — ABNORMAL HIGH (ref 65–99)

## 2017-10-19 LAB — PROTIME-INR
INR: 2.77
PROTHROMBIN TIME: 29 s — AB (ref 11.4–15.2)

## 2017-10-19 MED ORDER — WARFARIN SODIUM 10 MG PO TABS
10.0000 mg | ORAL_TABLET | Freq: Once | ORAL | Status: AC
  Administered 2017-10-19: 10 mg via ORAL
  Filled 2017-10-19: qty 1

## 2017-10-19 NOTE — Progress Notes (Signed)
[facility] MD Progress Note  10/19/2017 11:51 AM Lance [name]  MRN:  [id number]   Subjective:  Pt is in his room this morning laying in bed. He states that he is "doing okay." HE states that he is nervous about accidentally hurting someone when he doesn't want to. He has a lot of anxiety around others and these "urges come sometimes." He states that his plan to deal with them is by "telling someone or walking away." This is exactly what he has done in the past. He adamant denies any desire or intent to harm others. He did spend some time outside yesterday. Encouraged him to spend more time outside today. He denies SI or any thoughts of self harm. He is sleeping okay. HE is eating okay. HE has not had any outbursts or violence on the unit. Denies AH, VH.   Principal Problem: Schizophrenia (HCC) Diagnosis:   Patient Active Problem List   Diagnosis Date Noted  . Schizophrenia (HCC) [F20.9] 10/15/2017    Priority: High  . Schizophrenia, undifferentiated (HCC) [F20.3] 09/20/2017  . Tobacco use disorder [F17.200] 09/01/2016  . Schizoaffective disorder, bipolar type (HCC) [F25.0] 05/22/2016  . Diabetes mellitus without complication (HCC) [E11.9] 05/21/2016  . Hyperlipidemia [E78.5] 05/21/2016  . History of pulmonary embolism [Z86.711] 05/21/2016  . Chronic anticoagulation [Z79.01] 05/21/2016  . Hypertension [I10] 09/25/2015   Total Time spent with patient: 20 minutes  Past Psychiatric History: See H&p  Past Medical History:  Past Medical History:  Diagnosis Date  . Depression   . Diabetes mellitus without complication (HCC)   . Hyperlipidemia   . Hypertension   . Lupus anticoagulant disorder (HCC) 06/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  . PE (pulmonary thromboembolism) (HCC)   . Schizo affective schizophrenia (HCC)   . Supratherapeutic INR 11/19/2016   History reviewed. No pertinent surgical history. Family History:  Family History  Problem Relation Age of Onset  . CAD Mother    . CAD Sister    Family Psychiatric  History: See H&P Social History:  Social History   Substance and Sexual Activity  Alcohol Use No   Comment: occassionally     Social History   Substance and Sexual Activity  Drug Use Yes  . Types: Marijuana   Comment: Last use 11/29/16    Social History   Socioeconomic History  . Marital status: Single    Spouse name: Not on file  . Number of children: Not on file  . Years of education: Not on file  . Highest education level: Not on file  Occupational History  . Not on file  Social Needs  . Financial resource strain: Not on file  . Food insecurity:    Worry: Not on file    Inability: Not on file  . Transportation needs:    Medical: Not on file    Non-medical: Not on file  Tobacco Use  . Smoking status: Current Every Day Smoker    Packs/day: 1.00    Years: 23.00    Pack years: 23.00    Types: Cigarettes  . Smokeless tobacco: Never Used  . Tobacco comment: will provide material  Substance and Sexual Activity  . Alcohol use: No    Comment: occassionally  . Drug use: Yes    Types: Marijuana    Comment: Last use 11/29/16  . Sexual activity: Never    Birth control/protection: None    Comment: occasional marijuana- none recently  Lifestyle  . Physical activity:  Days per week: Not on file    Minutes per session: Not on file  . Stress: Not on file  Relationships  . Social connections:    Talks on phone: Not on file    Gets together: Not on file    Attends religious service: Not on file    Active member of club or organization: Not on file    Attends meetings of clubs or organizations: Not on file    Relationship status: Not on file  Other Topics Concern  . Not on file  Social History Narrative   From a group home in [place]   Additional Social History:    Pain Medications: see PTA Prescriptions: see PTA Over the Counter: see PTA History of alcohol / drug use?: No history of alcohol / drug abuse Longest  period of sobriety (when/how long): N/A Negative Consequences of Use: Personal relationships, Legal Name of Substance 1: Marijuana Name of Substance 2: Alcohol 2 - Last Use / Amount: one year ago                Sleep: Good  Appetite:  Good  Current Medications: Current Facility-Administered Medications  Medication Dose Route Frequency Provider Last Rate Last Dose  . acetaminophen (TYLENOL) tablet 650 mg  650 mg Oral Q6H PRN Josie Burleigh R, MD      . albuterol (PROVENTIL HFA;VENTOLIN HFA) 108 (90 Base) MCG/ACT inhaler 2 puff  2 puff Inhalation Q6H PRN Clapacs, [name], MD      . alum & mag hydroxide-simeth (MAALOX/MYLANTA) 200-200-20 MG/5ML suspension 30 mL  30 mL Oral Q4H PRN Keidy Thurgood, Ileene Hutchinson, MD      . cloZAPine (CLOZARIL) tablet 500 mg  500 mg Oral QHS Clapacs, [name], MD   500 mg at 10/18/17 2100  . fluticasone furoate-vilanterol (BREO ELLIPTA) 200-25 MCG/INH 1 puff  1 puff Inhalation Daily Anju Sereno, Ileene Hutchinson, MD   1 puff at 10/19/17 0854  . insulin detemir (LEVEMIR) injection 14 Units  14 Units Subcutaneous Daily Clapacs, [name], MD   14 Units at 10/19/17 0847  . ipratropium (ATROVENT) 0.03 % nasal spray 2 spray  2 spray Each Nare QHS Clapacs, [name], MD   2 spray at 10/17/17 2200  . lisinopril (PRINIVIL,ZESTRIL) tablet 2.5 mg  2.5 mg Oral Daily Clapacs, [name], MD   2.5 mg at 10/19/17 0853  . lithium carbonate (ESKALITH) CR tablet 450 mg  450 mg Oral Q12H Clapacs, [name], MD   450 mg at 10/19/17 0846  . magnesium hydroxide (MILK OF MAGNESIA) suspension 30 mL  30 mL Oral Daily PRN Watson Robarge R, MD      . metFORMIN (GLUCOPHAGE) tablet 500 mg  500 mg Oral BID WC Clapacs, [name], MD   500 mg at 10/19/17 0846  . polyethylene glycol (MIRALAX / GLYCOLAX) packet 17 g  17 g Oral Daily Clapacs, [name], MD   17 g at 10/19/17 0846  . simvastatin (ZOCOR) tablet 40 mg  40 mg Oral q1800 Clapacs, [name], MD   40 mg at 10/18/17 1703  . traZODone (DESYREL) tablet 75 mg  75 mg Oral QHS Clapacs, [name],  MD   75 mg at 10/18/17 2100  . Warfarin - Pharmacist Dosing Inpatient   Does not apply q1800 Shyne Lehrke, Ileene Hutchinson, MD        Lab Results:  Results for orders placed or performed during the hospital encounter of 10/14/17 (from the past 48 hour(s))  Glucose, capillary  Status: Abnormal   Collection Time: 10/17/17  4:58 PM  Result Value Ref Range   Glucose-Capillary 161 (H) 65 - 99 mg/dL  Protime-INR     Status: Abnormal   Collection Time: 10/18/17  6:52 AM  Result Value Ref Range   Prothrombin Time 26.7 (H) 11.4 - 15.2 seconds   INR 2.49     Comment: Performed at [facility], [address] [place], [place] [address]  Glucose, capillary     Status: Abnormal   Collection Time: 10/18/17  7:09 AM  Result Value Ref Range   Glucose-Capillary 112 (H) 65 - 99 mg/dL  Glucose, capillary     Status: None   Collection Time: 10/18/17  8:54 PM  Result Value Ref Range   Glucose-Capillary 98 65 - 99 mg/dL   Comment 1 Notify RN   Glucose, capillary     Status: Abnormal   Collection Time: 10/19/17  7:21 AM  Result Value Ref Range   Glucose-Capillary 108 (H) 65 - 99 mg/dL  Glucose, capillary     Status: None   Collection Time: 10/19/17 11:34 AM  Result Value Ref Range   Glucose-Capillary 94 65 - 99 mg/dL   Comment 1 Document in Chart     Blood Alcohol level:  Lab Results  Component Value Date   ETH <10 10/13/2017   ETH <10 10/09/2017    Metabolic Disorder Labs: Lab Results  Component Value Date   HGBA1C 4.7 (L) 09/21/2017   MPG 88.19 09/21/2017   MPG 105.41 03/03/2017   Lab Results  Component Value Date   PROLACTIN 2.0 (L) 08/31/2016   PROLACTIN 4.8 05/22/2016   Lab Results  Component Value Date   CHOL 119 09/22/2017   TRIG 277 (H) 09/22/2017   HDL 27 (L) 09/22/2017   CHOLHDL 4.4 09/22/2017   VLDL 55 (H) 09/22/2017   LDLCALC 37 09/22/2017   LDLCALC 59 03/03/2017    Physical Findings: AIMS: Facial and Oral Movements Muscles of Facial Expression: None,  normal Lips and Perioral Area: None, normal Jaw: None, normal Tongue: None, normal,Extremity Movements Upper (arms, wrists, hands, fingers): None, normal Lower (legs, knees, ankles, toes): None, normal, Trunk Movements Neck, shoulders, hips: None, normal, Overall Severity Severity of abnormal movements (highest score from questions above): None, normal Incapacitation due to abnormal movements: None, normal Patient's awareness of abnormal movements (rate only patient's report): No Awareness, Dental Status Current problems with teeth and/or dentures?: No Does patient usually wear dentures?: No  CIWA:    COWS:     Musculoskeletal: Strength & Muscle Tone: within normal limits Gait & Station: normal Patient leans: N/A  Psychiatric Specialty Exam: Physical Exam  Nursing note and vitals reviewed.   Review of Systems  All other systems reviewed and are negative.   Blood pressure 103/66, pulse 77, temperature 98.2 F (36.8 C), resp. rate 16, height  (1.778 m), weight 85.7 kg (189 lb), SpO2 99 %.Body mass index is 27.12 kg/m.  General Appearance: Casual  Eye Contact:  Fair  Speech:  Clear and Coherent  Volume:  Normal  Mood:  Depressed  Affect:  Appropriate  Thought Process:  Coherent and Goal Directed  Orientation:  Full (Time, Place, and Person)  Thought Content:  Logical  Suicidal Thoughts:  No  Homicidal Thoughts:  Yes.  without intent/plan  Memory:  Immediate;   Fair  Judgement:  Fair  Insight:  Fair  Psychomotor Activity:  Normal  Concentration:  Concentration: Fair  Recall:  [employer]  of Knowledge:  Fair  Language:  Fair  Akathisia:  No      Assets:  Resilience  ADL's:  Intact  Cognition:  WNL  Sleep:  Number of Hours: 7.3     T treatment Plan Summary: 42 yo male admitted due to HI. Pt is stable from a psychotic standpoint. HE reports almost intrusive anxiety thoughts about harming others. These thoughts are very ego dystonic and he does not want to hurt  anyone. The thoughts surround a lot of anxiety in social situations. He is also very stressed about his group home which is causing some worsening of symptoms. He could benefit from an SSRI to help with instrusive thoughts. I left message for his guardian to discuss starting this.   Plan:  Schizophrenia -Continue Clozapine 500 mg qhs. Level 402 -Continue Lithium 450 mg BID -Left message for guardian regarding starting Zoloft for intrusive thoughts  Insomnia -sleeping well -Continue Trazodone  HTN -Continue Lisinopril 2.5 mg daily  H/o PE -Pharmacy following daily. Checking INR daily -Coumadin at pharmacy discretion  Dispo -He will return to his group home on discharge and follow up with ACT team   Haskell Riling, MD 10/19/2017, 11:51 AM

## 2017-10-19 NOTE — [facility] Group Notes (Signed)
CSW Group Therapy Note  10/19/2017  Time:  0900  Type of Therapy and Topic: Group Therapy: Goals Group: SMART Goals    Participation Level:  Did Not Attend    Description of Group:   The purpose of a daily goals group is to assist and guide patients in setting recovery/wellness-related goals. The objective is to set goals as they relate to the crisis in which they were admitted. Patients will be using SMART goal modalities to set measurable goals. Characteristics of realistic goals will be discussed and patients will be assisted in setting and processing how one will reach their goal. Facilitator will also assist patients in applying interventions and coping skills learned in psycho-education groups to the SMART goal and process how one will achieve defined goal.    Therapeutic Goals:  -Patients will develop and document one goal related to or their crisis in which brought them into treatment.  -Patients will be guided by LCSW using SMART goal setting modality in how to set a measurable, attainable, realistic and time sensitive goal.  -Patients will process barriers in reaching goal.  -Patients will process interventions in how to overcome and successful in reaching goal.    Patient's Goal:  Pt was invited to attend group but chose not to attend. CSW will continue to encourage pt to attend group throughout their admission.     Therapeutic Modalities:  Motivational Interviewing  Cognitive Behavioral Therapy  Crisis Intervention Model  SMART goals setting  Heidi Dach, MSW, LCSW Clinical Social Worker 10/19/2017 9:58 AM

## 2017-10-19 NOTE — Plan of Care (Signed)
Pt compliant with medications, pt has passive SI but contracts for safety. Pt is receptive to treatment and safety maintained on unit. Pt denies HI. Pt compliant with medications. Will continue to monitor.

## 2017-10-19 NOTE — Plan of Care (Signed)
Limited  interaction with peers  Information given in a positive and concrete  form Non participatory with  unit programing . Working on Arboriculturist , Denies suicidal ideations  anxiety  Problem: Health Behavior/Discharge Planning: Goal: Ability to manage health-related needs will improve Outcome: Not Applicable   Problem: Education: Goal: Knowledge of General Education information will improve Outcome: Not Applicable   Problem: Self-Concept: Goal: Level of anxiety will decrease Outcome: Progressing    Problem: Health Behavior/Discharge Planning: Goal: Ability to make decisions will improve Outcome: Progressing Goal: Compliance with therapeutic regimen will improve Outcome: Progressing   Problem: Education: Goal: Utilization of techniques to improve thought processes will improve Outcome: Progressing Goal: Knowledge of the prescribed therapeutic regimen will improve Outcome: Progressing  Goal: Level of anxiety will decrease Outcome: Progressing   Problem: Role Relationship: Goal: Will demonstrate positive changes in social behaviors and relationships Outcome: Progressing   decrease

## 2017-10-19 NOTE — [facility] Group Notes (Signed)

## 2017-10-19 NOTE — Progress Notes (Signed)
ANTICOAGULATION CONSULT NOTE -  Pharmacy Consult for Warfarin Indication: Hx Pulmonary embolus  Allergies  Allergen Reactions  . Penicillins Other (See Comments)    Reaction: "lockjaw" Has patient had a PCN reaction causing immediate rash, facial/tongue/throat swelling, SOB or lightheadedness with hypotension: No Has patient had a PCN reaction causing severe rash involving mucus membranes or skin necrosis: No Has patient had a PCN reaction that required hospitalization: No Has patient had a PCN reaction occurring within the last 10 years: No If all of the above answers are "NO", then may proceed with Cephalosporin use.     Patient Measurements: Height:  (177.8 cm) Weight: 189 lb (85.7 kg) IBW/kg (Calculated) : 73 Heparin Dosing Weight:   Vital Signs: Temp: 98.2 F (36.8 C) (05/07 0653) BP: 103/66 (05/07 0654) Pulse Rate: 77 (05/07 0654)  Labs: Recent Labs    10/17/17 0621 10/18/17 0652 10/19/17 1156  LABPROT 24.1* 26.7* 29.0*  INR 2.18 2.49 2.77    Estimated Creatinine Clearance: 92.8 mL/min (by C-G formula based on SCr of 1.06 mg/dL).   Medical History: Past Medical History:  Diagnosis Date  . Depression   . Diabetes mellitus without complication (HCC)   . Hyperlipidemia   . Hypertension   . Lupus anticoagulant disorder (HCC) 06/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  . PE (pulmonary thromboembolism) (HCC)   . Schizo affective schizophrenia (HCC)   . Supratherapeutic INR 11/19/2016    Medications:  Scheduled:  . cloZAPine  500 mg Oral QHS  . fluticasone furoate-vilanterol  1 puff Inhalation Daily  . insulin detemir  14 Units Subcutaneous Daily  . ipratropium  2 spray Each Nare QHS  . lisinopril  2.5 mg Oral Daily  . lithium carbonate  450 mg Oral Q12H  . metFORMIN  500 mg Oral BID WC  . polyethylene glycol  17 g Oral Daily  . simvastatin  40 mg Oral q1800  . traZODone  75 mg Oral QHS  . warfarin  10 mg Oral ONCE-1800  . Warfarin -  Pharmacist Dosing Inpatient   Does not apply q1800    Assessment: 42 yo male on Warfarin for hx Pulmonary Embolism Home dose: Warfarin 10 mg daily  5/3 INR 2.85  Dose not given per eMAR 5/4 INR 2.56  Dose not given per eMAR 5/5 INR 2.18  Two doses given per eMAR- total 20 mg 5/6 INR 2.49   5/7 INR 2.77   Goal of Therapy:  INR 2-3 Monitor platelets by anticoagulation protocol: Yes   Plan:  INR therapeutic. Will give pt home dose of  tonight  Laurene Footman Madiline Saffran 10/19/2017,6:00 PM

## 2017-10-19 NOTE — Progress Notes (Signed)
Recreation Therapy Notes          Lance [name] 10/19/2017 12:12 PM

## 2017-10-19 NOTE — Progress Notes (Signed)
D: Limited  interaction with peers  Information given in a positive and concrete  form Non participatory with  unit programing . Working on Pharmacologist and decision making , Denies suicidal ideations  anxiety D: Patient stated slept good last night .Stated appetite is good and energy level  Is normal. Stated concentration is good . Stated on Depression scale 0 , hopeless 0 and anxiety 0.( low 0-10 high) Denies suicidal  homicidal ideations  .  No auditory hallucinations  No pain concerns . Appropriate ADL'S. Interacting with peers and staff.  Patient  Voice of goal to be discharge by Thursday A: Encourage patient participation with unit programming . Instruction  Given on  Medication , verbalize understanding. R: Voice no other concerns. Staff continue to monitor

## 2017-10-20 LAB — PROTIME-INR
INR: 2.88
PROTHROMBIN TIME: 29.9 s — AB (ref 11.4–15.2)

## 2017-10-20 LAB — GLUCOSE, CAPILLARY
GLUCOSE-CAPILLARY: 110 mg/dL — AB (ref 65–99)
GLUCOSE-CAPILLARY: 98 mg/dL (ref 65–99)
Glucose-Capillary: 196 mg/dL — ABNORMAL HIGH (ref 65–99)
Glucose-Capillary: 123 mg/dL — ABNORMAL HIGH (ref 65–99)

## 2017-10-20 MED ORDER — WARFARIN SODIUM 10 MG PO TABS
10.0000 mg | ORAL_TABLET | Freq: Once | ORAL | Status: AC
  Administered 2017-10-20: 10 mg via ORAL
  Filled 2017-10-20 (×2): qty 1

## 2017-10-20 MED ORDER — SERTRALINE HCL 25 MG PO TABS
50.0000 mg | ORAL_TABLET | Freq: Every day | ORAL | Status: DC
  Administered 2017-10-20 – 2017-10-22 (×3): 50 mg via ORAL
  Filled 2017-10-20 (×3): qty 2

## 2017-10-20 NOTE — Progress Notes (Signed)
D- Patient alert and oriented. Patient presents in a depressed, but pleasant mood on assessment stating that he slept "alright" last night. Patient rates his depression a "5/10" stating he's depressed because of "thinking about things". Patient denies SI, HI, AVH, and pain at this time. Patient also denies any signs/symptoms of anxiety. Patient's goal for today is to "get out of here tomorrow".  A- Scheduled medications administered to patient, per MD orders. Support and encouragement provided.  Routine safety checks conducted every 15 minutes.  Patient informed to notify staff with problems or concerns.  R- No adverse drug reactions noted. Patient contracts for safety at this time. Patient compliant with medications and treatment plan. Patient receptive, calm, and cooperative. Patient interacts well with others on the unit.  Patient remains safe at this time.

## 2017-10-20 NOTE — Progress Notes (Signed)
ANTICOAGULATION CONSULT NOTE -  Pharmacy Consult for Warfarin Indication: Hx Pulmonary embolus  Allergies  Allergen Reactions  . Penicillins Other (See Comments)    Reaction: "lockjaw" Has patient had a PCN reaction causing immediate rash, facial/tongue/throat swelling, SOB or lightheadedness with hypotension: No Has patient had a PCN reaction causing severe rash involving mucus membranes or skin necrosis: No Has patient had a PCN reaction that required hospitalization: No Has patient had a PCN reaction occurring within the last 10 years: No If all of the above answers are "NO", then may proceed with Cephalosporin use.     Patient Measurements: Height:  (177.8 cm) Weight: 189 lb (85.7 kg) IBW/kg (Calculated) : 73 Heparin Dosing Weight:   Vital Signs: Temp: 97.6 F (36.4 C) (05/08 0629) Temp Source: Oral (05/08 0629) BP: 95/54 (05/08 0631) Pulse Rate: 85 (05/08 0631)  Labs: Recent Labs    10/18/17 0652 10/19/17 1156 10/20/17 0922  LABPROT 26.7* 29.0* 29.9*  INR 2.49 2.77 2.88    Estimated Creatinine Clearance: 92.8 mL/min (by C-G formula based on SCr of 1.06 mg/dL).   Medical History: Past Medical History:  Diagnosis Date  . Depression   . Diabetes mellitus without complication (HCC)   . Hyperlipidemia   . Hypertension   . Lupus anticoagulant disorder (HCC) 06/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  . PE (pulmonary thromboembolism) (HCC)   . Schizo affective schizophrenia (HCC)   . Supratherapeutic INR 11/19/2016    Medications:  Scheduled:  . cloZAPine  500 mg Oral QHS  . fluticasone furoate-vilanterol  1 puff Inhalation Daily  . insulin detemir  14 Units Subcutaneous Daily  . ipratropium  2 spray Each Nare QHS  . lisinopril  2.5 mg Oral Daily  . lithium carbonate  450 mg Oral Q12H  . metFORMIN  500 mg Oral BID WC  . polyethylene glycol  17 g Oral Daily  . sertraline  50 mg Oral Daily  . simvastatin  40 mg Oral q1800  . traZODone  75 mg  Oral QHS  . Warfarin - Pharmacist Dosing Inpatient   Does not apply q1800    Assessment: 42 yo male on Warfarin for hx Pulmonary Embolism Home dose: Warfarin 10 mg daily  5/3 INR 2.85  Dose not given per eMAR 5/4 INR 2.56  Dose not given per eMAR 5/5 INR 2.18  Two doses given per eMAR- total 20 mg 5/6 INR 2.49   5/7 INR 2.77  10 mg   Goal of Therapy:  INR 2-3 Monitor platelets by anticoagulation protocol: Yes   Plan:  INR therapeutic. Will give pt home dose of  tonight. F/u INR in am  Hortense Cantrall A 10/20/2017,6:15 PM

## 2017-10-20 NOTE — Progress Notes (Signed)
Patient denies any pain as well as SI/HI. Voiced concerned he will be discharge on Thursday or Friday. He stated, " I  Hope I'll  be well by then." . Will continue to monitor every 15 minutes.

## 2017-10-20 NOTE — Progress Notes (Signed)
[facility] MD Progress Note  10/20/2017 2:52 PM Lance [name]  MRN:  [id number] Subjective:  Pt is in his room attempting to take a nap. He has been calm and pleasant on the unit. He states that he is feeling depressed and anxious because of the upcoming move to a new group home. He feels like he has a lot to do but is also looking forward to it. He denies SI or thoughts of harming himself. He denies HI or any thoughts of harming others. He has not had any outbursts or violence on the unit. HE did spend some time outside yesterday and he reports that it was helpful for him. Denies AH, VH. He slept okay and is eating well. He is organized in conversation.    Principal Problem: Schizophrenia (HCC) Diagnosis:   Patient Active Problem List   Diagnosis Date Noted  . Schizophrenia (HCC) [F20.9] 10/15/2017    Priority: High  . Schizophrenia, undifferentiated (HCC) [F20.3] 09/20/2017  . Tobacco use disorder [F17.200] 09/01/2016  . Schizoaffective disorder, bipolar type (HCC) [F25.0] 05/22/2016  . Diabetes mellitus without complication (HCC) [E11.9] 05/21/2016  . Hyperlipidemia [E78.5] 05/21/2016  . History of pulmonary embolism [Z86.711] 05/21/2016  . Chronic anticoagulation [Z79.01] 05/21/2016  . Hypertension [I10] 09/25/2015   Total Time spent with patient: 20 minutes  Past Psychiatric History: See H&p  Past Medical History:  Past Medical History:  Diagnosis Date  . Depression   . Diabetes mellitus without complication (HCC)   . Hyperlipidemia   . Hypertension   . Lupus anticoagulant disorder (HCC) 06/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  . PE (pulmonary thromboembolism) (HCC)   . Schizo affective schizophrenia (HCC)   . Supratherapeutic INR 11/19/2016   History reviewed. No pertinent surgical history. Family History:  Family History  Problem Relation Age of Onset  . CAD Mother   . CAD Sister    Family Psychiatric  History: See H&P Social History:  Social History    Substance and Sexual Activity  Alcohol Use No   Comment: occassionally     Social History   Substance and Sexual Activity  Drug Use Yes  . Types: Marijuana   Comment: Last use 11/29/16    Social History   Socioeconomic History  . Marital status: Single    Spouse name: Not on file  . Number of children: Not on file  . Years of education: Not on file  . Highest education level: Not on file  Occupational History  . Not on file  Social Needs  . Financial resource strain: Not on file  . Food insecurity:    Worry: Not on file    Inability: Not on file  . Transportation needs:    Medical: Not on file    Non-medical: Not on file  Tobacco Use  . Smoking status: Current Every Day Smoker    Packs/day: 1.00    Years: 23.00    Pack years: 23.00    Types: Cigarettes  . Smokeless tobacco: Never Used  . Tobacco comment: will provide material  Substance and Sexual Activity  . Alcohol use: No    Comment: occassionally  . Drug use: Yes    Types: Marijuana    Comment: Last use 11/29/16  . Sexual activity: Never    Birth control/protection: None    Comment: occasional marijuana- none recently  Lifestyle  . Physical activity:    Days per week: Not on file    Minutes per session: Not on  file  . Stress: Not on file  Relationships  . Social connections:    Talks on phone: Not on file    Gets together: Not on file    Attends religious service: Not on file    Active member of club or organization: Not on file    Attends meetings of clubs or organizations: Not on file    Relationship status: Not on file  Other Topics Concern  . Not on file  Social History Narrative   From a group home in [place]   Additional Social History:    Pain Medications: see PTA Prescriptions: see PTA Over the Counter: see PTA History of alcohol / drug use?: No history of alcohol / drug abuse Longest period of sobriety (when/how long): N/A Negative Consequences of Use: Personal relationships,  Legal Name of Substance 1: Marijuana Name of Substance 2: Alcohol 2 - Last Use / Amount: one year ago                Sleep: Good  Appetite:  Good  Current Medications: Current Facility-Administered Medications  Medication Dose Route Frequency Provider Last Rate Last Dose  . acetaminophen (TYLENOL) tablet 650 mg  650 mg Oral Q6H PRN Jewelle Whitner R, MD      . albuterol (PROVENTIL HFA;VENTOLIN HFA) 108 (90 Base) MCG/ACT inhaler 2 puff  2 puff Inhalation Q6H PRN Clapacs, [name], MD      . alum & mag hydroxide-simeth (MAALOX/MYLANTA) 200-200-20 MG/5ML suspension 30 mL  30 mL Oral Q4H PRN Sloan Galentine, Ileene Hutchinson, MD      . cloZAPine (CLOZARIL) tablet 500 mg  500 mg Oral QHS Clapacs, [name], MD   500 mg at 10/19/17 2139  . fluticasone furoate-vilanterol (BREO ELLIPTA) 200-25 MCG/INH 1 puff  1 puff Inhalation Daily Ikhlas Albo, Ileene Hutchinson, MD   1 puff at 10/20/17 0826  . insulin detemir (LEVEMIR) injection 14 Units  14 Units Subcutaneous Daily Clapacs, [name], MD   14 Units at 10/20/17 [phone]  . ipratropium (ATROVENT) 0.03 % nasal spray 2 spray  2 spray Each Nare QHS Clapacs, [name], MD   2 spray at 10/17/17 2200  . lisinopril (PRINIVIL,ZESTRIL) tablet 2.5 mg  2.5 mg Oral Daily Clapacs, [name], MD   2.5 mg at 10/19/17 0853  . lithium carbonate (ESKALITH) CR tablet 450 mg  450 mg Oral Q12H Clapacs, [name], MD   450 mg at 10/20/17 0825  . magnesium hydroxide (MILK OF MAGNESIA) suspension 30 mL  30 mL Oral Daily PRN Cire Clute R, MD      . metFORMIN (GLUCOPHAGE) tablet 500 mg  500 mg Oral BID WC Clapacs, [name], MD   500 mg at 10/20/17 0825  . polyethylene glycol (MIRALAX / GLYCOLAX) packet 17 g  17 g Oral Daily Clapacs, [name], MD   17 g at 10/19/17 0846  . sertraline (ZOLOFT) tablet 50 mg  50 mg Oral Daily Lylie Blacklock R, MD      . simvastatin (ZOCOR) tablet 40 mg  40 mg Oral q1800 Clapacs, [name], MD   40 mg at 10/19/17 1752  . traZODone (DESYREL) tablet 75 mg  75 mg Oral QHS Clapacs, [name], MD   75 mg at  10/19/17 2138  . Warfarin - Pharmacist Dosing Inpatient   Does not apply q1800 Yona Stansbury, Ileene Hutchinson, MD        Lab Results:  Results for orders placed or performed during the hospital encounter of 10/14/17 (from the  past 48 hour(s))  Glucose, capillary     Status: None   Collection Time: 10/18/17  8:54 PM  Result Value Ref Range   Glucose-Capillary 98 65 - 99 mg/dL   Comment 1 Notify RN   Glucose, capillary     Status: Abnormal   Collection Time: 10/19/17  7:21 AM  Result Value Ref Range   Glucose-Capillary 108 (H) 65 - 99 mg/dL  Glucose, capillary     Status: None   Collection Time: 10/19/17 11:34 AM  Result Value Ref Range   Glucose-Capillary 94 65 - 99 mg/dL   Comment 1 Document in Chart   Protime-INR     Status: Abnormal   Collection Time: 10/19/17 11:56 AM  Result Value Ref Range   Prothrombin Time 29.0 (H) 11.4 - 15.2 seconds   INR 2.77     Comment: Performed at [facility], [address] [place], [place] [address]  Glucose, capillary     Status: None   Collection Time: 10/19/17  4:31 PM  Result Value Ref Range   Glucose-Capillary 95 65 - 99 mg/dL   Comment 1 Document in Chart   Glucose, capillary     Status: Abnormal   Collection Time: 10/19/17  8:23 PM  Result Value Ref Range   Glucose-Capillary 111 (H) 65 - 99 mg/dL  Glucose, capillary     Status: Abnormal   Collection Time: 10/19/17 11:38 PM  Result Value Ref Range   Glucose-Capillary 124 (H) 65 - 99 mg/dL  Glucose, capillary     Status: Abnormal   Collection Time: 10/20/17  5:15 AM  Result Value Ref Range   Glucose-Capillary 110 (H) 65 - 99 mg/dL  Protime-INR     Status: Abnormal   Collection Time: 10/20/17  9:22 AM  Result Value Ref Range   Prothrombin Time 29.9 (H) 11.4 - 15.2 seconds   INR 2.88     Comment: Performed at [facility], [address] [place], [place] [address]  Glucose, capillary     Status: Abnormal   Collection Time: 10/20/17 12:41 PM  Result Value Ref Range    Glucose-Capillary 196 (H) 65 - 99 mg/dL    Blood Alcohol level:  Lab Results  Component Value Date   ETH <10 10/13/2017   ETH <10 10/09/2017    Metabolic Disorder Labs: Lab Results  Component Value Date   HGBA1C 4.7 (L) 09/21/2017   MPG 88.19 09/21/2017   MPG 105.41 03/03/2017   Lab Results  Component Value Date   PROLACTIN 2.0 (L) 08/31/2016   PROLACTIN 4.8 05/22/2016   Lab Results  Component Value Date   CHOL 119 09/22/2017   TRIG 277 (H) 09/22/2017   HDL 27 (L) 09/22/2017   CHOLHDL 4.4 09/22/2017   VLDL 55 (H) 09/22/2017   LDLCALC 37 09/22/2017   LDLCALC 59 03/03/2017    Physical Findings: AIMS: Facial and Oral Movements Muscles of Facial Expression: None, normal Lips and Perioral Area: None, normal Jaw: None, normal Tongue: None, normal,Extremity Movements Upper (arms, wrists, hands, fingers): None, normal Lower (legs, knees, ankles, toes): None, normal, Trunk Movements Neck, shoulders, hips: None, normal, Overall Severity Severity of abnormal movements (highest score from questions above): None, normal Incapacitation due to abnormal movements: None, normal Patient's awareness of abnormal movements (rate only patient's report): No Awareness, Dental Status Current problems with teeth and/or dentures?: No Does patient usually wear dentures?: No  CIWA:    COWS:     Musculoskeletal: Strength & Muscle Tone: within  normal limits Gait & Station: normal Patient leans: N/A  Psychiatric Specialty Exam: Physical Exam  Nursing note and vitals reviewed.   Review of Systems  All other systems reviewed and are negative.   Blood pressure (!) 95/54, pulse 85, temperature 97.6 F (36.4 C), temperature source Oral, resp. rate 18, height  (1.778 m), weight 85.7 kg (189 lb), SpO2 100 %.Body mass index is 27.12 kg/m.  General Appearance: Casual  Eye Contact:  Good  Speech:  Clear and Coherent  Volume:  Normal  Mood:  Euthymic  Affect:  Congruent  Thought  Process:  Coherent and Goal Directed  Orientation:  Full (Time, Place, and Person)  Thought Content:  Logical  Suicidal Thoughts:  No  Homicidal Thoughts:  No  Memory:  Immediate;   Fair  Judgement:  Fair  Insight:  Fair  Psychomotor Activity:  Normal  Concentration:  Concentration: Fair  Recall:  [employer] of Knowledge:  Fair  Language:  Fair  Akathisia:  No      Assets:  Resilience  ADL's:  Intact  Cognition:  WNL  Sleep:  Number of Hours: 6.45     Treatment Plan Summary: 42 yo male admitted due to anxious thoughts of harming others. He has been very calm on the unit with no unsafe behaviors. Affect seems brighter today. He still feels depressed about upcoming move.   Plan:  Schizophrenia -Continue Clozapine 500 mg qhs. Level 402 -Continue Lithium 450 mg BID -Will start Zoloft 50 mg daily for intrusive thoughts and depression  Insomnia -Continue trazodone  HTN -Stable -Continue Lisinopril 2.5 mg daily  H/O Pharmacy following and checking INR daily  Dispo -He will return to group home when stable   Haskell Riling, MD 10/20/2017, 2:52 PM

## 2017-10-20 NOTE — NC FL2 (Addendum)
[place] MEDICAID FL2 LEVEL OF CARE SCREENING TOOL     IDENTIFICATION  Patient Name: Lance [name] Birthdate: [birth year] Sex: male Admission Date (Current Location): 10/14/2017  [place] and [place] Number:  Chiropodist and Address:  [facility], [address] [place], [place] [address]      Provider Number: [id number]  Attending Physician Name and Address:  Haskell Riling, MD  Relative Name and Phone Number:  guardian,  [name] at  [phone]    Current Level of Care: Hospital Recommended Level of Care: Family Care Home Prior Approval Number:    Date Approved/Denied:   PASRR Number:    Discharge Plan: Other (Comment)(Family Care Home)    Current Diagnoses: Patient Active Problem List   Diagnosis Date Noted  . Schizophrenia (HCC) 10/15/2017  . 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 Tobacco use disorder 09/01/2016  . Schizoaffective disorder, bipolar type (HCC) 05/22/2016  . Diabetes mellitus without complication (HCC) 05/21/2016  . Hyperlipidemia 05/21/2016  . History of pulmonary embolism 05/21/2016  . Chronic anticoagulation 05/21/2016  . Hypertension 09/25/2015    Orientation RESPIRATION BLADDER Height & Weight     Self  Normal Continent Weight: 189 lb (85.7 kg) Height:   (177.8 cm)  BEHAVIORAL SYMPTOMS/MOOD NEUROLOGICAL BOWEL NUTRITION STATUS  (N/A) (N/A) Continent Diet(Normal)  AMBULATORY STATUS COMMUNICATION OF NEEDS Skin   Independent Verbally Normal                       Personal Care Assistance Level of Assistance  (N/A)           Functional Limitations Info  (N/A)          SPECIAL CARE FACTORS FREQUENCY                       Contractures Contractures Info: Not present    Additional Factors Info  Allergies   Allergies Info: Penicillins           Current Medications (10/21/2017):  This is the current hospital active medication list Current  Facility-Administered Medications  Medication Dose Route Frequency Provider Last Rate Last Dose  . acetaminophen (TYLENOL) tablet 650 mg  650 mg Oral Q6H PRN McNew, Holly R, MD      . albuterol (PROVENTIL HFA;VENTOLIN HFA) 108 (90 Base) MCG/ACT inhaler 2 puff  2 puff Inhalation Q6H PRN Clapacs, [name], MD      . alum & mag hydroxide-simeth (MAALOX/MYLANTA) 200-200-20 MG/5ML suspension 30 mL  30 mL Oral Q4H PRN McNew, Ileene Hutchinson, MD      . cloZAPine (CLOZARIL) tablet 500 mg  500 mg Oral QHS Clapacs, [name], MD   500 mg at 10/20/17 2125  . fluticasone furoate-vilanterol (BREO ELLIPTA) 200-25 MCG/INH 1 puff  1 puff Inhalation Daily McNew, Ileene Hutchinson, MD   1 puff at 10/20/17 0826  . insulin detemir (LEVEMIR) injection 14 Units  14 Units Subcutaneous Daily Clapacs, [name], MD   14 Units at 10/20/17 [phone]  . ipratropium (ATROVENT) 0.03 % nasal spray 2 spray  2 spray Each Nare QHS Clapacs, [name], MD   2 spray at 10/17/17 2200  . lisinopril (PRINIVIL,ZESTRIL) tablet 2.5 mg  2.5 mg Oral Daily Clapacs, [name], MD   2.5 mg at 10/19/17 0853  . lithium carbonate (ESKALITH) CR tablet 450 mg  450 mg Oral Q12H Clapacs, [name], MD   450 mg at 10/20/17 2024  .  magnesium hydroxide (MILK OF MAGNESIA) suspension 30 mL  30 mL Oral Daily PRN McNew, Holly R, MD      . metFORMIN (GLUCOPHAGE) tablet 500 mg  500 mg Oral BID WC Clapacs, [name], MD   500 mg at 10/20/17 1600  . polyethylene glycol (MIRALAX / GLYCOLAX) packet 17 g  17 g Oral Daily Clapacs, [name], MD   17 g at 10/19/17 0846  . sertraline (ZOLOFT) tablet 50 mg  50 mg Oral Daily McNew, Ileene Hutchinson, MD   50 mg at 10/20/17 1600  . simvastatin (ZOCOR) tablet 40 mg  40 mg Oral q1800 Clapacs, [name], MD   40 mg at 10/20/17 1730  . traZODone (DESYREL) tablet 75 mg  75 mg Oral QHS Clapacs, [name], MD   75 mg at 10/20/17 2125  . Warfarin - Pharmacist Dosing Inpatient   Does not apply q1800 McNew, Ileene Hutchinson, MD         Discharge Medications: Please see discharge summary for a list  of discharge medications.  Relevant Imaging Results:  Relevant Lab Results:   Additional Information None at this time.  Heidi Dach, LCSW

## 2017-10-20 NOTE — Progress Notes (Signed)
CSW contacted pt's group home [facility] at [phone] to alert them of pt's projected discharge for Friday, 10/22/17. [facility] did not answer, so CSW left a voicemail asking him to call back ASAP to discuss pt's discharge. CSW will continue to coordinate with pt's group home as needed for updates and discharge planning.   CSW contacted pt's guardian, [name], at [phone] to alert her of pt's discharge. Pt's guardian expressed understanding and agreement with this information. CSW will follow up as needed.   CSW contacted Liborio Nixon at [facility] to alert her of pt's discharge. Liborio Nixon reported pt will see the MD on Monday, 10/25/17 at 10AM. CSW will follow up with Liborio Nixon as needed for updates/discharge planning.   Heidi Dach, MSW, LCSW Clinical Social Worker 10/20/2017 1:14 PM

## 2017-10-20 NOTE — [facility] Suicide Risk Assessment (Signed)
[facility] INPATIENT:  Family/Significant Other Suicide Prevention Education  Suicide Prevention Education:  Education Completed; Pt's guardian, [name] at [phone],  has been identified by the patient as the family member/significant other with whom the patient will be residing, and identified as the person(s) who will aid the patient in the event of a mental health crisis (suicidal ideations/suicide attempt).  With written consent from the patient, the family member/significant other has been provided the following suicide prevention education, prior to the and/or following the discharge of the patient.  The suicide prevention education provided includes the following:  Suicide risk factors  Suicide prevention and interventions  National Suicide Hotline telephone number  [facility] assessment telephone number  [facility] Emergency Assistance 911  [facility] and/or Residential Mobile Crisis Unit telephone number  Request made of family/significant other to:  Remove weapons (e.g., guns, rifles, knives), all items previously/currently identified as safety concern.    Remove drugs/medications (over-the-counter, prescriptions, illicit drugs), all items previously/currently identified as a safety concern.  The family member/significant other verbalizes understanding of the suicide prevention education information provided.  The family member/significant other agrees to remove the items of safety concern listed above.  Heidi Dach, LCSW 10/20/2017, 1:10 PM

## 2017-10-20 NOTE — Progress Notes (Signed)
Recreation Therapy Notes  Date: 10/20/2017  Time: 9:30 am   Location: Craft Room   Behavioral response: N/A   Intervention Topic: Stress  Discussion/Intervention: Patient did not attend group.   Clinical Observations/Feedback:  Patient did not attend group.   Demarius Archila LRT/CTRS        Keino Placencia 10/20/2017 11:58 AM

## 2017-10-20 NOTE — Progress Notes (Signed)
Patient slept for 5hours and 45  minutes. No problem.

## 2017-10-20 NOTE — Plan of Care (Signed)
Patient has the ability to utilize and improve his thought processes. Patient has verbalized understanding of the general information that has been provided to him and has not voiced any further questions/concerns at this time. Patient has been out in the milieu for majority of the day interacting well with other members on the unit without any issues. Patient has the ability to cope and has been in compliance with his therapeutic regimen and all questions and concerns have been addressed and answered at this time. Patient denies SI/HI/AVH as well as any signs/symptoms of anxiety at this time. Patient's goal for today was to "get out of here tomorrow". Patient remains free from injury and is safe on the unit at this time.  Problem: Education: Goal: Utilization of techniques to improve thought processes will improve Outcome: Progressing Goal: Knowledge of the prescribed therapeutic regimen will improve Outcome: Progressing   Problem: Activity: Goal: Interest or engagement in leisure activities will improve Outcome: Progressing Goal: Imbalance in normal sleep/wake cycle will improve Outcome: Progressing   Problem: Coping: Goal: Coping ability will improve Outcome: Progressing Goal: Will verbalize feelings Outcome: Progressing   Problem: Health Behavior/Discharge Planning: Goal: Ability to make decisions will improve Outcome: Progressing Goal: Compliance with therapeutic regimen will improve Outcome: Progressing   Problem: Role Relationship: Goal: Will demonstrate positive changes in social behaviors and relationships Outcome: Progressing   Problem: Safety: Goal: Ability to disclose and discuss suicidal ideas will improve Outcome: Progressing Goal: Ability to identify and utilize support systems that promote safety will improve Outcome: Progressing   Problem: Self-Concept: Goal: Will verbalize positive feelings about self Outcome: Progressing Goal: Level of anxiety will  decrease Outcome: Progressing

## 2017-10-21 LAB — PROTIME-INR
INR: 3.44
Prothrombin Time: 34.4 seconds — ABNORMAL HIGH (ref 11.4–15.2)

## 2017-10-21 LAB — GLUCOSE, CAPILLARY
GLUCOSE-CAPILLARY: 114 mg/dL — AB (ref 65–99)
GLUCOSE-CAPILLARY: 139 mg/dL — AB (ref 65–99)
Glucose-Capillary: 250 mg/dL — ABNORMAL HIGH (ref 65–99)
Glucose-Capillary: 118 mg/dL — ABNORMAL HIGH (ref 65–99)

## 2017-10-21 LAB — CBC WITH DIFFERENTIAL/PLATELET
BASOS PCT: 0 %
Basophils Absolute: 0 10*3/uL (ref 0–0.1)
EOS PCT: 0 %
Eosinophils Absolute: 0 10*3/uL (ref 0–0.7)
HCT: 42.6 % (ref 40.0–52.0)
HEMOGLOBIN: 14.9 g/dL (ref 13.0–18.0)
LYMPHS ABS: 1 10*3/uL (ref 1.0–3.6)
Lymphocytes Relative: 25 %
MCH: 32.2 pg (ref 26.0–34.0)
MCHC: 35 g/dL (ref 32.0–36.0)
MCV: 91.8 fL (ref 80.0–100.0)
MONOS PCT: 13 %
Monocytes Absolute: 0.5 10*3/uL (ref 0.2–1.0)
NEUTROS PCT: 62 %
Neutro Abs: 2.5 10*3/uL (ref 1.4–6.5)
PLATELETS: 177 10*3/uL (ref 150–440)
RBC: 4.64 MIL/uL (ref 4.40–5.90)
RDW: 12.4 % (ref 11.5–14.5)
WBC: 4 10*3/uL (ref 3.8–10.6)

## 2017-10-21 MED ORDER — SERTRALINE HCL 50 MG PO TABS: 50.0000 mg | ORAL_TABLET | Freq: Every day | ORAL | 0 refills | Status: DC

## 2017-10-21 NOTE — Progress Notes (Signed)
Recreation Therapy Notes   Date: 10/21/2017  Time: 9:30 am   Location: Craft Room   Behavioral response: N/A   Intervention Topic: Coping Skills  Discussion/Intervention: Patient did not attend group.   Clinical Observations/Feedback:  Patient did not attend group.   Unika Nazareno LRT/CTRS        Blanche Scovell 10/21/2017 12:07 PM 

## 2017-10-21 NOTE — [facility] Group Notes (Signed)
  10/21/2017  Time: 1PM  Type of Therapy/Topic:  Group Therapy:  Balance in Life  Participation Level:  Did Not Attend  Description of Group:   This group will address the concept of balance and how it feels and looks when one is unbalanced. Patients will be encouraged to process areas in their lives that are out of balance and identify reasons for remaining unbalanced. Facilitators will guide patients in utilizing problem-solving interventions to address and correct the stressor making their life unbalanced. Understanding and applying boundaries will be explored and addressed for obtaining and maintaining a balanced life. Patients will be encouraged to explore ways to assertively make their unbalanced needs known to significant others in their lives, using other group members and facilitator for support and feedback.  Therapeutic Goals: 1. Patient will identify two or more emotions or situations they have that consume much of in their lives. 2. Patient will identify signs/triggers that life has become out of balance:  3. Patient will identify two ways to set boundaries in order to achieve balance in their lives:  4. Patient will demonstrate ability to communicate their needs through discussion and/or role plays  Summary of Patient Progress: Pt was invited to attend group but chose not to attend. CSW will continue to encourage pt to attend group throughout their admission.   Therapeutic Modalities:   Cognitive Behavioral Therapy Solution-Focused Therapy Assertiveness Training  Zariana Strub, MSW, LCSW Clinical Social Worker 10/21/2017 1:59 PM   

## 2017-10-21 NOTE — Progress Notes (Signed)
ANTICOAGULATION CONSULT NOTE -  Pharmacy Consult for Warfarin Indication: Hx Pulmonary embolus  Allergies  Allergen Reactions  . Penicillins Other (See Comments)    Reaction: "lockjaw" Has patient had a PCN reaction causing immediate rash, facial/tongue/throat swelling, SOB or lightheadedness with hypotension: No Has patient had a PCN reaction causing severe rash involving mucus membranes or skin necrosis: No Has patient had a PCN reaction that required hospitalization: No Has patient had a PCN reaction occurring within the last 10 years: No If all of the above answers are "NO", then may proceed with Cephalosporin use.     Patient Measurements: Height:  (177.8 cm) Weight: 189 lb (85.7 kg) IBW/kg (Calculated) : 73 Heparin Dosing Weight:   Vital Signs: Temp: 97.6 F (36.4 C) (05/09 0630) Temp Source: Oral (05/09 0630) BP: 115/58 (05/09 1610) Pulse Rate: 81 (05/09 0632)  Labs: Recent Labs    10/19/17 1156 10/20/17 0922 10/21/17 0828  HGB  --   --  14.9  HCT  --   --  42.6  PLT  --   --  177  LABPROT 29.0* 29.9* 34.4*  INR 2.77 2.88 3.44    Estimated Creatinine Clearance: 92.8 mL/min (by C-G formula based on SCr of 1.06 mg/dL).   Medical History: Past Medical History:  Diagnosis Date  . Depression   . Diabetes mellitus without complication (HCC)   . Hyperlipidemia   . Hypertension   . Lupus anticoagulant disorder (HCC) 06/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  . PE (pulmonary thromboembolism) (HCC)   . Schizo affective schizophrenia (HCC)   . Supratherapeutic INR 11/19/2016    Medications:  Scheduled:  . cloZAPine  500 mg Oral QHS  . fluticasone furoate-vilanterol  1 puff Inhalation Daily  . insulin detemir  14 Units Subcutaneous Daily  . ipratropium  2 spray Each Nare QHS  . lisinopril  2.5 mg Oral Daily  . lithium carbonate  450 mg Oral Q12H  . metFORMIN  500 mg Oral BID WC  . polyethylene glycol  17 g Oral Daily  . sertraline  50 mg  Oral Daily  . simvastatin  40 mg Oral q1800  . traZODone  75 mg Oral QHS  . Warfarin - Pharmacist Dosing Inpatient   Does not apply q1800    Assessment: 42 yo male on Warfarin for hx Pulmonary Embolism Home dose: Warfarin 10 mg daily  5/3 INR 2.85  Dose not given per eMAR 5/4 INR 2.56  Dose not given per eMAR 5/5 INR 2.18  Two doses given per eMAR- total 20 mg 5/6 INR 2.49   5 mg  5/7 INR 2.77  10 mg    5/8 INR 2.88  10 mg 5/9 INR 3.44    Goal of Therapy:  INR 2-3 Monitor platelets by anticoagulation protocol: Yes   Plan:  INR supratherapeutic. Hold dose tonight. F/U INR in am  Amberly Livas K, RPH 10/21/2017,2:15 PM

## 2017-10-21 NOTE — [facility] Group Notes (Signed)
[facility] Group Notes:  (Nursing/MHT/Case Management/Adjunct)  Date:  10/21/2017  Time:  12:17 AM  Type of Therapy:  Group Therapy  Participation Level:  Active  Participation Quality:  Didn't sit down.  Affect:  Appropriate  Cognitive:  Alert  Insight:  Appropriate  Engagement in Group:  Engaged  Modes of Intervention:  Support  Summary of Progress/Problems:  Mayra Neer 10/21/2017, 12:17 AM

## 2017-10-21 NOTE — Progress Notes (Signed)
Patient ID: Lance [name], male   DOB: [birth year], 42 y.o.   MRN: [id number] CBG=139, Mood and affect pleasant, not guarded, not hostile, not irritable, denies SI/HI/AVH, anticipating discharge in the morning.

## 2017-10-21 NOTE — [facility] Group Notes (Signed)
LCSW Group Therapy Note 10/21/2017 9:00 AM  Type of Therapy and Topic:  Group Therapy:  Setting Goals  Participation Level:  Did Not Attend  Description of Group: In this process group, patients discussed using strengths to work toward goals and address challenges.  Patients identified two positive things about themselves and one goal they were working on.  Patients were given the opportunity to share openly and support each other's plan for self-empowerment.  The group discussed the value of gratitude and were encouraged to have a daily reflection of positive characteristics or circumstances.  Patients were encouraged to identify a plan to utilize their strengths to work on current challenges and goals.  Therapeutic Goals 1. Patient will verbalize personal strengths/positive qualities and relate how these can assist with achieving desired personal goals 2. Patients will verbalize affirmation of peers plans for personal change and goal setting 3. Patients will explore the value of gratitude and positive focus as related to successful achievement of goals 4. Patients will verbalize a plan for regular reinforcement of personal positive qualities and circumstances.  Summary of Patient Progress:  [place] was invited to today's group, but chose not to attend.     Therapeutic Modalities Cognitive Behavioral Therapy Motivational Interviewing    Alease Frame, [place] 10/21/2017 10:05 AM

## 2017-10-21 NOTE — Progress Notes (Signed)
[facility] MD Progress Note  10/21/2017 3:15 PM Lance [name]  MRN:  [id number] Subjective:  Pt sates taht he is doing okay today. He is out of his room more. HE is anxious about the upcoming move and this has been on his mind especially all the packing he has to do. He denies SI or thoughts of self harm. He denies thoughts of harming others but "Still don't like to be around others." HE has not had any unsafe behaviors on the unit. He is sleeping well. Denies AH, VH.   Principal Problem: Schizophrenia (HCC) Diagnosis:   Patient Active Problem List   Diagnosis Date Noted  . Schizophrenia (HCC) [F20.9] 10/15/2017    Priority: High  . Schizophrenia, undifferentiated (HCC) [F20.3] 09/20/2017  . Tobacco use disorder [F17.200] 09/01/2016  . Schizoaffective disorder, bipolar type (HCC) [F25.0] 05/22/2016  . Diabetes mellitus without complication (HCC) [E11.9] 05/21/2016  . Hyperlipidemia [E78.5] 05/21/2016  . History of pulmonary embolism [Z86.711] 05/21/2016  . Chronic anticoagulation [Z79.01] 05/21/2016  . Hypertension [I10] 09/25/2015   Total Time spent with patient: 20 minutes  Past Psychiatric History: See H&P  Past Medical History:  Past Medical History:  Diagnosis Date  . Depression   . Diabetes mellitus without complication (HCC)   . Hyperlipidemia   . Hypertension   . Lupus anticoagulant disorder (HCC) 06/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  . PE (pulmonary thromboembolism) (HCC)   . Schizo affective schizophrenia (HCC)   . Supratherapeutic INR 11/19/2016   History reviewed. No pertinent surgical history. Family History:  Family History  Problem Relation Age of Onset  . CAD Mother   . CAD Sister    Family Psychiatric  History: See h&P Social History:  Social History   Substance and Sexual Activity  Alcohol Use No   Comment: occassionally     Social History   Substance and Sexual Activity  Drug Use Yes  . Types: Marijuana   Comment: Last use 11/29/16     Social History   Socioeconomic History  . Marital status: Single    Spouse name: Not on file  . Number of children: Not on file  . Years of education: Not on file  . Highest education level: Not on file  Occupational History  . Not on file  Social Needs  . Financial resource strain: Not on file  . Food insecurity:    Worry: Not on file    Inability: Not on file  . Transportation needs:    Medical: Not on file    Non-medical: Not on file  Tobacco Use  . Smoking status: Current Every Day Smoker    Packs/day: 1.00    Years: 23.00    Pack years: 23.00    Types: Cigarettes  . Smokeless tobacco: Never Used  . Tobacco comment: will provide material  Substance and Sexual Activity  . Alcohol use: No    Comment: occassionally  . Drug use: Yes    Types: Marijuana    Comment: Last use 11/29/16  . Sexual activity: Never    Birth control/protection: None    Comment: occasional marijuana- none recently  Lifestyle  . Physical activity:    Days per week: Not on file    Minutes per session: Not on file  . Stress: Not on file  Relationships  . Social connections:    Talks on phone: Not on file    Gets together: Not on file    Attends religious service: Not on  file    Active member of club or organization: Not on file    Attends meetings of clubs or organizations: Not on file    Relationship status: Not on file  Other Topics Concern  . Not on file  Social History Narrative   From a group home in [place]   Additional Social History:    Pain Medications: see PTA Prescriptions: see PTA Over the Counter: see PTA History of alcohol / drug use?: No history of alcohol / drug abuse Longest period of sobriety (when/how long): N/A Negative Consequences of Use: Personal relationships, Legal Name of Substance 1: Marijuana Name of Substance 2: Alcohol 2 - Last Use / Amount: one year ago                Sleep: Good  Appetite:  Good  Current Medications: Current  Facility-Administered Medications  Medication Dose Route Frequency Provider Last Rate Last Dose  . acetaminophen (TYLENOL) tablet 650 mg  650 mg Oral Q6H PRN Lura Falor R, MD      . albuterol (PROVENTIL HFA;VENTOLIN HFA) 108 (90 Base) MCG/ACT inhaler 2 puff  2 puff Inhalation Q6H PRN Clapacs, [name], MD      . alum & mag hydroxide-simeth (MAALOX/MYLANTA) 200-200-20 MG/5ML suspension 30 mL  30 mL Oral Q4H PRN Arnett Duddy, Ileene Hutchinson, MD      . cloZAPine (CLOZARIL) tablet 500 mg  500 mg Oral QHS Clapacs, [name], MD   500 mg at 10/20/17 2125  . fluticasone furoate-vilanterol (BREO ELLIPTA) 200-25 MCG/INH 1 puff  1 puff Inhalation Daily Hilmer Aliberti, Ileene Hutchinson, MD   1 puff at 10/20/17 0826  . insulin detemir (LEVEMIR) injection 14 Units  14 Units Subcutaneous Daily Clapacs, [name], MD   14 Units at 10/21/17 [phone]  . ipratropium (ATROVENT) 0.03 % nasal spray 2 spray  2 spray Each Nare QHS Clapacs, [name], MD   2 spray at 10/17/17 2200  . lisinopril (PRINIVIL,ZESTRIL) tablet 2.5 mg  2.5 mg Oral Daily Clapacs, [name], MD   2.5 mg at 10/21/17 0918  . lithium carbonate (ESKALITH) CR tablet 450 mg  450 mg Oral Q12H Clapacs, [name], MD   450 mg at 10/21/17 0918  . magnesium hydroxide (MILK OF MAGNESIA) suspension 30 mL  30 mL Oral Daily PRN Jehu Mccauslin R, MD      . metFORMIN (GLUCOPHAGE) tablet 500 mg  500 mg Oral BID WC Clapacs, [name], MD   500 mg at 10/21/17 0918  . polyethylene glycol (MIRALAX / GLYCOLAX) packet 17 g  17 g Oral Daily Clapacs, [name], MD   17 g at 10/19/17 0846  . sertraline (ZOLOFT) tablet 50 mg  50 mg Oral Daily Ashlea Dusing, Ileene Hutchinson, MD   50 mg at 10/21/17 0918  . simvastatin (ZOCOR) tablet 40 mg  40 mg Oral q1800 Clapacs, [name], MD   40 mg at 10/20/17 1730  . traZODone (DESYREL) tablet 75 mg  75 mg Oral QHS Clapacs, [name], MD   75 mg at 10/20/17 2125  . Warfarin - Pharmacist Dosing Inpatient   Does not apply q1800 Marcel Sorter, Ileene Hutchinson, MD        Lab Results:  Results for orders placed or performed during the  hospital encounter of 10/14/17 (from the past 48 hour(s))  Glucose, capillary     Status: None   Collection Time: 10/19/17  4:31 PM  Result Value Ref Range   Glucose-Capillary 95 65 - 99 mg/dL  Comment 1 Document in Chart   Glucose, capillary     Status: Abnormal   Collection Time: 10/19/17  8:23 PM  Result Value Ref Range   Glucose-Capillary 111 (H) 65 - 99 mg/dL  Glucose, capillary     Status: Abnormal   Collection Time: 10/19/17 11:38 PM  Result Value Ref Range   Glucose-Capillary 124 (H) 65 - 99 mg/dL  Glucose, capillary     Status: Abnormal   Collection Time: 10/20/17  5:15 AM  Result Value Ref Range   Glucose-Capillary 110 (H) 65 - 99 mg/dL  Protime-INR     Status: Abnormal   Collection Time: 10/20/17  9:22 AM  Result Value Ref Range   Prothrombin Time 29.9 (H) 11.4 - 15.2 seconds   INR 2.88     Comment: Performed at [facility], [address] [place], [place] [address]  Glucose, capillary     Status: Abnormal   Collection Time: 10/20/17 12:41 PM  Result Value Ref Range   Glucose-Capillary 196 (H) 65 - 99 mg/dL  Glucose, capillary     Status: Abnormal   Collection Time: 10/20/17  4:09 PM  Result Value Ref Range   Glucose-Capillary 123 (H) 65 - 99 mg/dL  Glucose, capillary     Status: None   Collection Time: 10/20/17  8:25 PM  Result Value Ref Range   Glucose-Capillary 98 65 - 99 mg/dL  Glucose, capillary     Status: Abnormal   Collection Time: 10/21/17  7:02 AM  Result Value Ref Range   Glucose-Capillary 114 (H) 65 - 99 mg/dL   Comment 1 Notify RN   Protime-INR     Status: Abnormal   Collection Time: 10/21/17  8:28 AM  Result Value Ref Range   Prothrombin Time 34.4 (H) 11.4 - 15.2 seconds   INR 3.44     Comment: Performed at [facility], [address] [place], [place] [address]  CBC with Differential/Platelet     Status: None   Collection Time: 10/21/17  8:28 AM  Result Value Ref Range   WBC 4.0 3.8 - 10.6 K/uL   RBC 4.64 4.40 - 5.90  MIL/uL   Hemoglobin 14.9 13.0 - 18.0 g/dL   HCT 09.8 11.9 - 14.7 %   MCV 91.8 80.0 - 100.0 fL   MCH 32.2 26.0 - 34.0 pg   MCHC 35.0 32.0 - 36.0 g/dL   RDW 82.9 56.2 - 13.0 %   Platelets 177 150 - 440 K/uL   Neutrophils Relative % 62 %   Neutro Abs 2.5 1.4 - 6.5 K/uL   Lymphocytes Relative 25 %   Lymphs Abs 1.0 1.0 - 3.6 K/uL   Monocytes Relative 13 %   Monocytes Absolute 0.5 0.2 - 1.0 K/uL   Eosinophils Relative 0 %   Eosinophils Absolute 0.0 0 - 0.7 K/uL   Basophils Relative 0 %   Basophils Absolute 0.0 0 - 0.1 K/uL    Comment: Performed at [facility], [address] [place], [place] [address]  Glucose, capillary     Status: Abnormal   Collection Time: 10/21/17 11:29 AM  Result Value Ref Range   Glucose-Capillary 250 (H) 65 - 99 mg/dL    Blood Alcohol level:  Lab Results  Component Value Date   ETH <10 10/13/2017   ETH <10 10/09/2017    Metabolic Disorder Labs: Lab Results  Component Value Date   HGBA1C 4.7 (L) 09/21/2017   MPG 88.19 09/21/2017   MPG 105.41 03/03/2017  Lab Results  Component Value Date   PROLACTIN 2.0 (L) 08/31/2016   PROLACTIN 4.8 05/22/2016   Lab Results  Component Value Date   CHOL 119 09/22/2017   TRIG 277 (H) 09/22/2017   HDL 27 (L) 09/22/2017   CHOLHDL 4.4 09/22/2017   VLDL 55 (H) 09/22/2017   LDLCALC 37 09/22/2017   LDLCALC 59 03/03/2017    Physical Findings: AIMS: Facial and Oral Movements Muscles of Facial Expression: None, normal Lips and Perioral Area: None, normal Jaw: None, normal Tongue: None, normal,Extremity Movements Upper (arms, wrists, hands, fingers): None, normal Lower (legs, knees, ankles, toes): None, normal, Trunk Movements Neck, shoulders, hips: None, normal, Overall Severity Severity of abnormal movements (highest score from questions above): None, normal Incapacitation due to abnormal movements: None, normal Patient's awareness of abnormal movements (rate only patient's report): No Awareness,  Dental Status Current problems with teeth and/or dentures?: No Does patient usually wear dentures?: No  CIWA:    COWS:     Musculoskeletal: Strength & Muscle Tone: within normal limits Gait & Station: normal Patient leans: N/A  Psychiatric Specialty Exam: Physical Exam  Nursing note and vitals reviewed.   Review of Systems  All other systems reviewed and are negative.   Blood pressure (!) 115/58, pulse 81, temperature 97.6 F (36.4 C), temperature source Oral, resp. rate 20, height  (1.778 m), weight 85.7 kg (189 lb), SpO2 99 %.Body mass index is 27.12 kg/m.  General Appearance: Casual  Eye Contact:  Fair  Speech:  Clear and Coherent  Volume:  Normal  Mood:  Depressed  Affect:  Appropriate  Thought Process:  Coherent and Goal Directed  Orientation:  Full (Time, Place, and Person)  Thought Content:  Logical  Suicidal Thoughts:  No  Homicidal Thoughts:  No  Memory:  NA  Judgement:  Fair  Insight:  Fair  Psychomotor Activity:  Normal  Concentration:  Concentration: Fair  Recall:  [employer] of Knowledge:  Fair  Language:  Fair  Akathisia:  No      Assets:  Resilience  ADL's:  Intact  Cognition:  WNL  Sleep:  Number of Hours: 6.15     Treatment Plan Summary: 42 yo male admitted due to thoughts of harming others. Pt has not had any unsafe behaviors on the unit. Denies SI or HI. He was started on Zoloft yesterday for depression and possible intrusive thoughts.   Plan:  Schizophrenia -Continue Clozapine 500 mg qhs. Level 402 -Continue Lithium 450 mg BID -Continue Zoloft 50 mg daily  Insomnia -Continue trazodone  HTN -stable -Lisinopril 2.5 mg daily  H/o PE -Checking INR daily, on coumadin  Dispo -He will return to group home on discharge. Likely tomorrow  Haskell Riling, MD 10/21/2017, 3:15 PM

## 2017-10-21 NOTE — Progress Notes (Signed)
Patient ID: Lance [name], male   DOB: 1975-10-27, 42 y.o.   MRN: [id number] Mood and affect much better because, "I am going to a different GH, I will be there for 28 days and then move to a Care Home ..." CBG=98, denied SI/HI/SIB/AVH. Excited and "anticipating discharge on Friday."

## 2017-10-22 LAB — PROTIME-INR
INR: 2.99
Prothrombin Time: 30.8 seconds — ABNORMAL HIGH (ref 11.4–15.2)

## 2017-10-22 LAB — GLUCOSE, CAPILLARY: GLUCOSE-CAPILLARY: 104 mg/dL — AB (ref 65–99)

## 2017-10-22 MED ORDER — WARFARIN SODIUM 7.5 MG PO TABS: 7.5000 mg | ORAL_TABLET | Freq: Once | ORAL | Status: DC

## 2017-10-22 NOTE — Plan of Care (Signed)
Patient slept for Estimated Hours of 7.45; Precautionary checks every 15 minutes for safety maintained, room free of safety hazards, patient sustains no injury or falls during this shift.  Problem: Education: Goal: Utilization of techniques to improve thought processes will improve Outcome: Progressing Goal: Knowledge of the prescribed therapeutic regimen will improve Outcome: Progressing   Problem: Activity: Goal: Interest or engagement in leisure activities will improve Outcome: Progressing Goal: Imbalance in normal sleep/wake cycle will improve Outcome: Progressing   Problem: Coping: Goal: Coping ability will improve Outcome: Progressing Goal: Will verbalize feelings Outcome: Progressing   Problem: Health Behavior/Discharge Planning: Goal: Ability to make decisions will improve Outcome: Progressing Goal: Compliance with therapeutic regimen will improve Outcome: Progressing   Problem: Role Relationship: Goal: Will demonstrate positive changes in social behaviors and relationships Outcome: Progressing   Problem: Safety: Goal: Ability to disclose and discuss suicidal ideas will improve Outcome: Progressing Goal: Ability to identify and utilize support systems that promote safety will improve Outcome: Progressing   Problem: Self-Concept: Goal: Will verbalize positive feelings about self Outcome: Progressing Goal: Level of anxiety will decrease Outcome: Progressing

## 2017-10-22 NOTE — Progress Notes (Signed)
Recreation Therapy Notes  INPATIENT RECREATION TR PLAN  Patient Details Name: Lance [name] MRN: [id number] DOB: [birth year] Today's Date: 10/22/2017  Rec Therapy Plan Is patient appropriate for Therapeutic Recreation?: Yes Treatment times per week: at least 3 Estimated Length of Stay: 5-7 days TR Treatment/Interventions: Group participation (Comment)  Discharge Criteria Pt will be discharged from therapy if:: Discharged Treatment plan/goals/alternatives discussed and agreed upon by:: Patient/family  Discharge Summary Short term goals set: Patient will engage in groups without prompting or encouragement from LRT x3 group sessions within 5 recreation therapy group sessions Short term goals met: Not met Reason goals not met: Patient spent asll his time in his room Therapeutic equipment acquired: N/A Reason patient discharged from therapy: Discharge from hospital Pt/family agrees with progress & goals achieved: Yes Date patient discharged from therapy: 10/22/17   Kindle Strohmeier 10/22/2017, 10:25 AM

## 2017-10-22 NOTE — Discharge Instructions (Signed)
Continue same doing of Warfarin that you were getting at Group home

## 2017-10-22 NOTE — Progress Notes (Signed)
Recreation Therapy Notes  Date: 10/22/2017  Time: 9:30 am   Location: Craft Room   Behavioral response: N/A   Intervention Topic: Leisure  Discussion/Intervention: Patient did not attend group.   Clinical Observations/Feedback:  Patient did not attend group.   Adda Stokes LRT/CTRS        Gavyn Zoss 10/22/2017 10:23 AM

## 2017-10-22 NOTE — Progress Notes (Signed)
  [facility] Adult Case Management Discharge Plan :  Will you be returning to the same living situation after discharge:  Yes,  retruning to group home At discharge, do you have transportation home?: Yes,  group home Do you have the ability to pay for your medications: Yes,  ACTT  Release of information consent forms completed and in the chart;  Patient's signature needed at discharge.  Patient to Follow up at: Follow-up Information    [address] [facility] Go on 10/25/2017.   Why:  Please go to your hospital follow up with your ACTT Doctor on Monday, 10/25/17 at 10AM. Your ACT Team will also come by your group home on Friday, 10/22/17. Thank you.  Contact information: [address] [place] [address] [place] [address] [phone]           Next level of care provider has access to [facility] Link:no  Safety Planning and Suicide Prevention discussed: Yes,  with pt and his guardian.  Have you used any form of tobacco in the last 30 days? (Cigarettes, Smokeless Tobacco, Cigars, and/or Pipes): Yes  Has patient been referred to the Quitline?: Patient refused referral  Patient has been referred for addiction treatment: N/A  Heidi Dach, LCSW 10/22/2017, 8:27 AM

## 2017-10-22 NOTE — Discharge Summary (Signed)
Physician Discharge Summary Note  Patient:  Lance [name] is an 42 y.o., male MRN:  [id number] DOB:  12-18-1975 Patient phone:  [phone] (home)  Patient address:   [address] [name] [address],  Total Time spent with patient: 15 minutes  Plus 20 minutes of medication reconciliation, discharge planning, and discharge documentation   Date of Admission:  10/14/2017 Date of Discharge: 10/22/17  Reason for Admission:  Thoughts of harming others.   Principal Problem: Schizophrenia [facility]) Discharge Diagnoses: Patient Active Problem List   Diagnosis Date Noted  . Schizophrenia (HCC) [F20.9] 10/15/2017    Priority: High  . Schizophrenia, undifferentiated (HCC) [F20.3] 09/20/2017  . Tobacco use disorder [F17.200] 09/01/2016  . Schizoaffective disorder, bipolar type (HCC) [F25.0] 05/22/2016  . Diabetes mellitus without complication (HCC) [E11.9] 05/21/2016  . Hyperlipidemia [E78.5] 05/21/2016  . History of pulmonary embolism [Z86.711] 05/21/2016  . Chronic anticoagulation [Z79.01] 05/21/2016  . Hypertension [I10] 09/25/2015    Past Psychiatric History: See H&P  Past Medical History:  Past Medical History:  Diagnosis Date  . Depression   . Diabetes mellitus without complication (HCC)   . Hyperlipidemia   . Hypertension   . Lupus anticoagulant disorder (HCC) 06/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  . PE (pulmonary thromboembolism) (HCC)   . Schizo affective schizophrenia (HCC)   . Supratherapeutic INR 11/19/2016   History reviewed. No pertinent surgical history. Family History:  Family History  Problem Relation Age of Onset  . CAD Mother   . CAD Sister    Family Psychiatric  History: See H&P Social History:  Social History   Substance and Sexual Activity  Alcohol Use No   Comment: occassionally     Social History   Substance and Sexual Activity  Drug Use Yes  . Types: Marijuana   Comment: Last use 11/29/16    Social History   Socioeconomic  History  . Marital status: Single    Spouse name: Not on file  . Number of children: Not on file  . Years of education: Not on file  . Highest education level: Not on file  Occupational History  . Not on file  Social Needs  . Financial resource strain: Not on file  . Food insecurity:    Worry: Not on file    Inability: Not on file  . Transportation needs:    Medical: Not on file    Non-medical: Not on file  Tobacco Use  . Smoking status: Current Every Day Smoker    Packs/day: 1.00    Years: 23.00    Pack years: 23.00    Types: Cigarettes  . Smokeless tobacco: Never Used  . Tobacco comment: will provide material  Substance and Sexual Activity  . Alcohol use: No    Comment: occassionally  . Drug use: Yes    Types: Marijuana    Comment: Last use 11/29/16  . Sexual activity: Never    Birth control/protection: None    Comment: occasional marijuana- none recently  Lifestyle  . Physical activity:    Days per week: Not on file    Minutes per session: Not on file  . Stress: Not on file  Relationships  . Social connections:    Talks on phone: Not on file    Gets together: Not on file    Attends religious service: Not on file    Active member of club or organization: Not on file    Attends meetings of clubs or organizations: Not on  file    Relationship status: Not on file  Other Topics Concern  . Not on file  Social History Narrative   From a group home in [facility] Course:  Pt was restarted on all home medications except Wellbutrin, as it was considered that it could have been causing some anxiety and intrusive thoughts about harming others. Clozapine level was 402 and Lithium level was 0.77.   He was started on Zoloft to help with some of these symptoms. His thoughts of harming others was very ego dystonic and was related to a lot of anxious around other. He continuously denied any target, plan or intent of harming others. He is not a violent person. He did not  have any unsafe behaviors on the unit at all and was able to be around other patients and interact well.He has good coping skills and is able to walk away from situations that are anxiety provoking. On day of discharge, he reported that he felt ready to go. He did have some anxiety about his upcoming move but wanted to start packing for it. He denied SI and denied HI. He was tolerating medications well. He denied SI, HI, AH, VH. His thought remained organized and goal directed through hospitalization. He did not appear psychotic or manic.   The patient is at low risk of imminent suicide. Patient denied thoughts, intent, or plan for harm to self or others, expressed significant future orientation, and expressed an ability to mobilize assistance for his needs. He is presently void of any contributing psychiatric symptoms, cognitive difficulties, or substance use which would elevate his risk for lethality. Chronic risk for lethality is elevated in light of chronic mental illness, male gender. The chronic risk is presently mitigated by his ongoing desire and engagement in [facility] treatment and mobilization of support from family and friends. Chronic risk may elevate if he experiences any significant loss or worsening of symptoms, which can be managed and monitored through outpatient providers. At this time,a cute risk for lethality is low and h is stable for ongoing outpatient management.   Modifiable risk factors were addressed during this hospitalization through appropriate pharmacotherapy and establishment of outpatient follow-up treatment. Some risk factors for suicide are situational (i.e. Unstable housing) or related personality pathology (i.e. Poor coping mechanisms) and thus cannot be further mitigated by continued hospitalization in this setting.   The patient is at low risk of imminent violence to others. Patient denied any thoughts, intent, plan for harm to others. The patient is not a violent person.  Additionally, the pt does not appear manic, psychotic, or depressed.  IN discussion, he does show an ability to appreciate the nature of his actions, the consequences his behaviors, and the resultant legal ramifications.     Physical Findings: AIMS: Facial and Oral Movements Muscles of Facial Expression: None, normal Lips and Perioral Area: None, normal Jaw: None, normal Tongue: None, normal,Extremity Movements Upper (arms, wrists, hands, fingers): None, normal Lower (legs, knees, ankles, toes): None, normal, Trunk Movements Neck, shoulders, hips: None, normal, Overall Severity Severity of abnormal movements (highest score from questions above): None, normal Incapacitation due to abnormal movements: None, normal Patient's awareness of abnormal movements (rate only patient's report): No Awareness, Dental Status Current problems with teeth and/or dentures?: No Does patient usually wear dentures?: No  CIWA:    COWS:     Musculoskeletal: Strength & Muscle Tone: within normal limits Gait & Station: normal Patient leans: N/A  Psychiatric Specialty Exam: Physical Exam  ROS  Blood pressure 132/87, pulse 99, temperature 97.8 F (36.6 C), temperature source Oral, resp. rate 20, height  (1.778 m), weight 85.7 kg (189 lb), SpO2 99 %.Body mass index is 27.12 kg/m.  General Appearance: Casual  Eye Contact:  Fair  Speech:  Clear and Coherent  Volume:  Normal  Mood:  Euthymic  Affect:  Appropriate  Thought Process:  Coherent and Goal Directed  Orientation:  Full (Time, Place, and Person)  Thought Content:  Logical  Suicidal Thoughts:  No  Homicidal Thoughts:  No  Memory:  Immediate;   Fair  Judgement:  Fair  Insight:  Fair  Psychomotor Activity:  Normal  Concentration:  Concentration: Fair  Recall:  Fair  Fund of Knowledge:  Fair  Language:  Fair  Akathisia:  No      Assets:  Resilience  ADL's:  Intact  Cognition:  WNL  Sleep:  Number of Hours: 7.45     Have you  used any form of tobacco in the last 30 days? (Cigarettes, Smokeless Tobacco, Cigars, and/or Pipes): Yes  Has this patient used any form of tobacco in the last 30 days? (Cigarettes, Smokeless Tobacco, Cigars, and/or Pipes) Yes, Yes, A prescription for an FDA-approved tobacco cessation medication was offered at discharge and the patient refused  Blood Alcohol level:  Lab Results  Component Value Date   [facility] <10 10/13/2017   ETH <10 10/09/2017    Metabolic Disorder Labs:  Lab Results  Component Value Date   HGBA1C 4.7 (L) 09/21/2017   MPG 88.19 09/21/2017   MPG 105.41 03/03/2017   Lab Results  Component Value Date   PROLACTIN 2.0 (L) 08/31/2016   PROLACTIN 4.8 05/22/2016   Lab Results  Component Value Date   CHOL 119 09/22/2017   TRIG 277 (H) 09/22/2017   HDL 27 (L) 09/22/2017   CHOLHDL 4.4 09/22/2017   VLDL 55 (H) 09/22/2017   LDLCALC 37 09/22/2017   LDLCALC 59 03/03/2017    See Psychiatric Specialty Exam and Suicide Risk Assessment completed by Attending Physician prior to discharge.  Discharge destination:  Group Home  Is patient on multiple antipsychotic therapies at discharge:  No   Has Patient had three or more failed trials of antipsychotic monotherapy by history:  No  Recommended Plan for Multiple Antipsychotic Therapies: NA  Discharge Instructions    Increase activity slowly   Complete by:  As directed      Allergies as of 10/22/2017      Reactions   Penicillins Other (See Comments)   Reaction: "lockjaw" Has patient had a PCN reaction causing immediate rash, facial/tongue/throat swelling, SOB or lightheadedness with hypotension: No Has patient had a PCN reaction causing severe rash involving mucus membranes or skin necrosis: No Has patient had a PCN reaction that required hospitalization: No Has patient had a PCN reaction occurring within the last 10 years: No If all of the above answers are "NO", then may proceed with Cephalosporin use.      Medication  List    STOP taking these medications   buPROPion 150 MG 24 hr tablet Commonly known as:  WELLBUTRIN XL     TAKE these medications     Indication  albuterol 108 (90 Base) MCG/ACT inhaler Commonly known as:  PROVENTIL HFA;VENTOLIN HFA Inhale 1-2 puffs into the lungs every 4 (four) hours as needed for wheezing or shortness of breath.  Indication:  Asthma   cloZAPine 100 MG tablet Commonly known as:  CLOZARIL Take 5 tablets (500  mg total) by mouth at bedtime.  Indication:  Schizophrenia   Fluticasone-Salmeterol 250-50 MCG/DOSE Aepb Commonly known as:  ADVAIR Inhale 1 puff into the lungs 2 (two) times daily.  Indication:  Asthma   insulin detemir 100 UNIT/ML injection Commonly known as:  LEVEMIR Inject 14 Units into the skin every morning.  Indication:  Type 2 Diabetes   ipratropium 0.06 % nasal spray Commonly known as:  ATROVENT Place 1 spray into both nostrils at bedtime.  Indication:  Runny Nose associated with Hayfever   lisinopril 2.5 MG tablet Commonly known as:  PRINIVIL,ZESTRIL Take 2.5 mg by mouth daily.  Indication:  High Blood Pressure Disorder   lithium carbonate 450 MG CR tablet Commonly known as:  ESKALITH Take 1 tablet (450 mg total) by mouth every 12 (twelve) hours.  Indication:  Manic-Depression   metformin 500 MG (OSM) 24 hr tablet Commonly known as:  FORTAMET Take 500 mg by mouth 2 (two) times daily with a meal.  Indication:  Type 2 Diabetes   mometasone-formoterol 200-5 MCG/ACT Aero Commonly known as:  DULERA Inhale 2 puffs into the lungs 2 (two) times daily.  Indication:  Asthma   omega-3 acid ethyl esters 1 g capsule Commonly known as:  LOVAZA Take 1 g by mouth daily.  Indication:  High Amount of Triglycerides in the Blood   polyethylene glycol packet Commonly known as:  MIRALAX / GLYCOLAX Take 17 g by mouth daily.  Indication:  Constipation   sertraline 50 MG tablet Commonly known as:  ZOLOFT Take 1 tablet (50 mg total) by mouth  daily.  Indication:  Major Depressive Disorder, Obsessive Compulsive Disorder   simvastatin 40 MG tablet Commonly known as:  ZOCOR Take 40 mg by mouth at bedtime.  Indication:  High Amount of Fats in the Blood   traZODone 50 MG tablet Commonly known as:  DESYREL Take 75 mg by mouth at bedtime.  Indication:  Trouble Sleeping   warfarin 10 MG tablet Commonly known as:  COUMADIN Take 10 mg by mouth daily.  Indication:  Blockage of Blood Vessel to Lung by a Particle      Follow-up Information    [name] [place] Go on 10/25/2017.   Why:  Please go to your hospital follow up with your ACTT Doctor on Monday, 10/25/17 at 10AM. Your ACT Team will also come by your group home on Friday, 10/22/17. Thank you.  Contact information: [address] [place] [address] [phone]           Follow-up recommendations: [name]    Signed: Haskell Riling, MD 10/22/2017, 9:01 AM

## 2017-10-22 NOTE — Progress Notes (Signed)
MEDICATION RELATED CONSULT NOTE - INITIAL   Pharmacy Consult for Clozapine   Allergies  Allergen Reactions  . Penicillins Other (See Comments)    Reaction: "lockjaw" Has patient had a PCN reaction causing immediate rash, facial/tongue/throat swelling, SOB or lightheadedness with hypotension: No Has patient had a PCN reaction causing severe rash involving mucus membranes or skin necrosis: No Has patient had a PCN reaction that required hospitalization: No Has patient had a PCN reaction occurring within the last 10 years: No If all of the above answers are "NO", then may proceed with Cephalosporin use.     Patient Measurements: Height:  (177.8 cm) Weight: 189 lb (85.7 kg) IBW/kg (Calculated) : 73 Adjusted Body Weight:    Vital Signs: Temp: 97.8 F (36.6 C) (05/10 0617) Temp Source: Oral (05/10 0617) BP: 132/87 (05/10 0617) Pulse Rate: 99 (05/10 0617) Intake/Output from previous day: 05/09 0701 - 05/10 0700 In: 1560 [P.O.:1560] Out: -  Intake/Output from this shift: No intake/output data recorded.  Labs: Recent Labs    10/21/17 0828  WBC 4.0  HGB 14.9  HCT 42.6  PLT 177   Estimated Creatinine Clearance: 92.8 mL/min (by C-G formula based on SCr of 1.06 mg/dL).   Microbiology: No results found for this or any previous visit (from the past 720 hour(s)).  Medical History: Past Medical History:  Diagnosis Date  . Depression   . Diabetes mellitus without complication (HCC)   . Hyperlipidemia   . Hypertension   . Lupus anticoagulant disorder (HCC) 06/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  . PE (pulmonary thromboembolism) (HCC)   . Schizo affective schizophrenia (HCC)   . Supratherapeutic INR 11/19/2016    Medications:  Scheduled:  . cloZAPine  500 mg Oral QHS  . fluticasone furoate-vilanterol  1 puff Inhalation Daily  . insulin detemir  14 Units Subcutaneous Daily  . ipratropium  2 spray Each Nare QHS  . lisinopril  2.5 mg Oral Daily  .  lithium carbonate  450 mg Oral Q12H  . metFORMIN  500 mg Oral BID WC  . polyethylene glycol  17 g Oral Daily  . sertraline  50 mg Oral Daily  . simvastatin  40 mg Oral q1800  . traZODone  75 mg Oral QHS  . Warfarin - Pharmacist Dosing Inpatient   Does not apply q1800    Assessment: 42 yo M on clozapine PTA   Plan:  5/9 ANC 2.5  Lab submitted to clozapine REMS.  Monitor ANC weekly while inpatient. Next ANC 10/28/17  Lance [name] A 10/22/2017,8:24 AM

## 2017-10-22 NOTE — Progress Notes (Signed)
Received Draco this AM after breakfast, he was compliant with his medications. He denied all of the psychiatric symptoms. He feels safe for discharge home today and waiting for a representative to arrive from the group home.The AVS was reviewed and his questions answered.His personal belongings returned. He was discharge without  Incident with a representative from the group home.

## 2017-10-22 NOTE — BHH Suicide Risk Assessment (Signed)
[facility] Discharge Suicide Risk Assessment   Principal Problem: Schizophrenia [facility]) Discharge Diagnoses:  Patient Active Problem List   Diagnosis Date Noted  . Schizophrenia (HCC) [F20.9] 10/15/2017    Priority: High  . Schizophrenia, undifferentiated (HCC) [F20.3] 09/20/2017  . Tobacco use disorder [F17.200] 09/01/2016  . Schizoaffective disorder, bipolar type (HCC) [F25.0] 05/22/2016  . Diabetes mellitus without complication (HCC) [E11.9] 05/21/2016  . Hyperlipidemia [E78.5] 05/21/2016  . History of pulmonary embolism [Z86.711] 05/21/2016  . Chronic anticoagulation [Z79.01] 05/21/2016  . Hypertension [I10] 09/25/2015    Total Time spent with patient: 15 minutes    Mental Status Per Nursing Assessment::   On Admission:  NA  Demographic Factors:  Male and Caucasian  Loss Factors: Decrease in vocational status and Financial problems/change in socioeconomic status  Historical Factors: Impulsivity  Risk Reduction Factors:   Living with another person, especially a relative  Continued Clinical Symptoms:  Anxiety  Cognitive Features That Contribute To Risk:  None    Suicide Risk:  Minimal: No identifiable suicidal ideation.   Follow-up Information    [address] [place] [facility] [place] Go on 10/25/2017.   Why:  Please go to your hospital follow up with your ACTT Doctor on Monday, 10/25/17 at 10AM. Your ACT Team will also come by your group home on Friday, 10/22/17. Thank you.  Contact information: [address] [place] [address] [phone]           Plan Of Care/Follow-up recommendations: Follow up with [facility], MD 10/22/2017, 9:00 AM

## 2017-10-22 NOTE — Progress Notes (Signed)
ANTICOAGULATION CONSULT NOTE -  Pharmacy Consult for Warfarin Indication: Hx Pulmonary embolus  Allergies  Allergen Reactions  . Penicillins Other (See Comments)    Reaction: "lockjaw" Has patient had a PCN reaction causing immediate rash, facial/tongue/throat swelling, SOB or lightheadedness with hypotension: No Has patient had a PCN reaction causing severe rash involving mucus membranes or skin necrosis: No Has patient had a PCN reaction that required hospitalization: No Has patient had a PCN reaction occurring within the last 10 years: No If all of the above answers are "NO", then may proceed with Cephalosporin use.     Patient Measurements: Height:  (177.8 cm) Weight: 189 lb (85.7 kg) IBW/kg (Calculated) : 73 Heparin Dosing Weight:   Vital Signs: Temp: 97.8 F (36.6 C) (05/10 0617) Temp Source: Oral (05/10 0617) BP: 132/87 (05/10 0617) Pulse Rate: 99 (05/10 0617)  Labs: Recent Labs    10/20/17 0922 10/21/17 0828 10/22/17 0724  HGB  --  14.9  --   HCT  --  42.6  --   PLT  --  177  --   LABPROT 29.9* 34.4* 30.8*  INR 2.88 3.44 2.99    Estimated Creatinine Clearance: 92.8 mL/min (by C-G formula based on SCr of 1.06 mg/dL).   Medical History: Past Medical History:  Diagnosis Date  . Depression   . Diabetes mellitus without complication (HCC)   . Hyperlipidemia   . Hypertension   . Lupus anticoagulant disorder (HCC) 06/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  . PE (pulmonary thromboembolism) (HCC)   . Schizo affective schizophrenia (HCC)   . Supratherapeutic INR 11/19/2016    Medications:  Scheduled:  . cloZAPine  500 mg Oral QHS  . fluticasone furoate-vilanterol  1 puff Inhalation Daily  . insulin detemir  14 Units Subcutaneous Daily  . ipratropium  2 spray Each Nare QHS  . lisinopril  2.5 mg Oral Daily  . lithium carbonate  450 mg Oral Q12H  . metFORMIN  500 mg Oral BID WC  . polyethylene glycol  17 g Oral Daily  . sertraline  50 mg  Oral Daily  . simvastatin  40 mg Oral q1800  . traZODone  75 mg Oral QHS  . Warfarin - Pharmacist Dosing Inpatient   Does not apply q1800    Assessment: 42 yo male on Warfarin for hx Pulmonary Embolism Home dose: Warfarin 10 mg daily  5/3  INR 2.85  Dose not given per eMAR 5/4  INR 2.56  Dose not given per eMAR 5/5  INR 2.18  Two doses given per eMAR- total 20 mg 5/6  INR 2.49   5 mg  5/7  INR 2.77  10 mg    5/8  INR 2.88  10 mg 5/9  INR 3.44  Dose HELD 5/10 INR 2.99   Goal of Therapy:  INR 2-3 Monitor platelets by anticoagulation protocol: Yes   Plan:  Will order Warfarin 7.5 mg x 1.  F/U INR in am  Maven Rosander A, RPH 10/22/2017,8:48 AM

## 2017-11-24 ENCOUNTER — Emergency Department
Admission: EM | Admit: 2017-11-24 | Discharge: 2017-11-24 | Disposition: A | Discharge status: Home / Self Care | Payer: Medicare Other | Attending: Emergency Medicine | Admitting: Emergency Medicine

## 2017-11-24 ENCOUNTER — Other Ambulatory Visit: Payer: Self-pay

## 2017-11-24 DIAGNOSIS — F17210 Nicotine dependence, cigarettes, uncomplicated: Secondary | ICD-10-CM | POA: Diagnosis not present

## 2017-11-24 DIAGNOSIS — R791 Abnormal coagulation profile: Secondary | ICD-10-CM | POA: Diagnosis not present

## 2017-11-24 DIAGNOSIS — E785 Hyperlipidemia, unspecified: Secondary | ICD-10-CM | POA: Insufficient documentation

## 2017-11-24 DIAGNOSIS — R310 Gross hematuria: Secondary | ICD-10-CM | POA: Diagnosis not present

## 2017-11-24 DIAGNOSIS — E119 Type 2 diabetes mellitus without complications: Secondary | ICD-10-CM | POA: Diagnosis not present

## 2017-11-24 DIAGNOSIS — Z79899 Other long term (current) drug therapy: Secondary | ICD-10-CM | POA: Insufficient documentation

## 2017-11-24 DIAGNOSIS — Z7901 Long term (current) use of anticoagulants: Secondary | ICD-10-CM | POA: Insufficient documentation

## 2017-11-24 DIAGNOSIS — Z794 Long term (current) use of insulin: Secondary | ICD-10-CM | POA: Insufficient documentation

## 2017-11-24 DIAGNOSIS — I10 Essential (primary) hypertension: Secondary | ICD-10-CM | POA: Diagnosis not present

## 2017-11-24 DIAGNOSIS — Z86718 Personal history of other venous thrombosis and embolism: Secondary | ICD-10-CM | POA: Diagnosis not present

## 2017-11-24 DIAGNOSIS — R319 Hematuria, unspecified: Secondary | ICD-10-CM | POA: Diagnosis not present

## 2017-11-24 LAB — CBC WITH DIFFERENTIAL/PLATELET
Basophils Absolute: 0 10*3/uL (ref 0–0.1)
Basophils Relative: 0 %
Eosinophils Absolute: 0 10*3/uL (ref 0–0.7)
Eosinophils Relative: 0 %
HEMATOCRIT: 45.3 % (ref 40.0–52.0)
Hemoglobin: 15.8 g/dL (ref 13.0–18.0)
LYMPHS ABS: 1.3 10*3/uL (ref 1.0–3.6)
LYMPHS PCT: 20 %
MCH: 32.3 pg (ref 26.0–34.0)
MCHC: 35 g/dL (ref 32.0–36.0)
MCV: 92.4 fL (ref 80.0–100.0)
MONO ABS: 0.7 10*3/uL (ref 0.2–1.0)
Monocytes Relative: 11 %
NEUTROS ABS: 4.6 10*3/uL (ref 1.4–6.5)
Neutrophils Relative %: 69 %
Platelets: 211 10*3/uL (ref 150–440)
RBC: 4.9 MIL/uL (ref 4.40–5.90)
RDW: 13.3 % (ref 11.5–14.5)
WBC: 6.6 10*3/uL (ref 3.8–10.6)

## 2017-11-24 LAB — PROTIME-INR
INR: 9.68
Prothrombin Time: 77.2 seconds — ABNORMAL HIGH (ref 11.4–15.2)

## 2017-11-24 LAB — BASIC METABOLIC PANEL
ANION GAP: 10 (ref 5–15)
BUN: 16 mg/dL (ref 6–20)
CO2: 22 mmol/L (ref 22–32)
Calcium: 9.5 mg/dL (ref 8.9–10.3)
Chloride: 106 mmol/L (ref 101–111)
Creatinine, Ser: 0.97 mg/dL (ref 0.61–1.24)
GFR calc Af Amer: >60 mL/min (ref >60)
GFR calc non Af Amer: >60 mL/min (ref >60)
GLUCOSE: 140 mg/dL — AB (ref 65–99)
POTASSIUM: 4 mmol/L (ref 3.5–5.1)
Sodium: 138 mmol/L (ref 135–145)

## 2017-11-24 LAB — URINALYSIS, COMPLETE (UACMP) WITH MICROSCOPIC
Bacteria, UA: NONE SEEN
Bilirubin Urine: NEGATIVE
GLUCOSE, UA: NEGATIVE mg/dL
KETONES UR: NEGATIVE mg/dL
LEUKOCYTES UA: NEGATIVE
NITRITE: NEGATIVE
PH: 6 (ref 5.0–8.0)
PROTEIN: 100 mg/dL — AB
Specific Gravity, Urine: 1.015 (ref 1.005–1.030)
Squamous Epithelial / LPF: NONE SEEN (ref 0–5)

## 2017-11-24 LAB — APTT: aPTT: 76 seconds — ABNORMAL HIGH (ref 24–36)

## 2017-11-24 NOTE — ED Notes (Signed)
Spoke with Maureen RalphsVivian and notified her of patient's ED visit and discharge back to the group home he came from.

## 2017-11-24 NOTE — Discharge Instructions (Addendum)
Your INR (which measures your Coumadin level) is currently 9, which is too high.  DO NOT take your Coumadin today or tomorrow.  Call Dr. Harl BowieMassoud tomorrow to arrange to have the Coumadin rechecked on Friday at the latest.  This will guide when you should resume taking it.  Return to the ER for new, worsening, persistent severe bleeding, either urinary or elsewhere such as gastrointestinal bleeding or blood in the stool, or any other new or worsening symptoms that concern you.

## 2017-11-24 NOTE — ED Triage Notes (Signed)
Hematuria X 1 today. Pt hx of same, states his coumadin level was too high at that time. Denies pain. Pt alert and oriented X4, active, cooperative, pt in NAD. RR even and unlabored, color WNL.

## 2017-11-24 NOTE — ED Notes (Signed)
Legal guardian, Lance [name] contacted and gives consent for treatment.

## 2017-11-24 NOTE — ED Notes (Signed)
Message left on Maureen RalphsVivian (legal guardians) voicemail regarding ED visit and patient's discharge back to his group home.

## 2017-11-24 NOTE — ED Provider Notes (Signed)
[facility] Emergency Department Provider Note ____________________________________________   First MD Initiated Contact with Patient 11/24/17 1758     (approximate)  I have reviewed the triage vital signs and the nursing notes.   HISTORY  Chief Complaint Hematuria    HPI Lance [name] is a 42 y.o. male with PMH as noted below including PE on Coumadin who presents with hematuria, acute onset today, described as gross blood in the urine, and not associated with any pain.  Patient reports mild discomfort to his left flank.  He states that his INR was checked "a few days ago" and was normal.  He denies any other abnormal bleeding or bruising.  Denies dysuria or fever.  Past Medical History:  Diagnosis Date  . Depression   . Diabetes mellitus without complication (HCC)   . Hyperlipidemia   . Hypertension   . Lupus anticoagulant disorder (HCC) 06/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  . PE (pulmonary thromboembolism) (HCC)   . Schizo affective schizophrenia (HCC)   . Supratherapeutic INR 11/19/2016    Patient Active Problem List   Diagnosis Date Noted  . Schizophrenia (HCC) 10/15/2017  . 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 Tobacco use disorder 09/01/2016  . Schizoaffective disorder, bipolar type (HCC) 05/22/2016  . Diabetes mellitus without complication (HCC) 05/21/2016  . Hyperlipidemia 05/21/2016  . History of pulmonary embolism 05/21/2016  . Chronic anticoagulation 05/21/2016  . Hypertension 09/25/2015    History reviewed. No pertinent surgical history.  Prior to Admission medications   Medication Sig Start Date End Date Taking? Authorizing Provider  albuterol (PROVENTIL HFA;VENTOLIN HFA) 108 (90 Base) MCG/ACT inhaler Inhale 1-2 puffs into the lungs every 4 (four) hours as needed for wheezing or shortness of breath. 12/31/16   Jimmy Footman, MD  cloZAPine (CLOZARIL) 100 MG tablet Take 5 tablets (500 mg  total) by mouth at bedtime. 12/03/16   Jimmy Footman, MD  Fluticasone-Salmeterol (ADVAIR) 250-50 MCG/DOSE AEPB Inhale 1 puff into the lungs 2 (two) times daily.    [provider]  insulin detemir (LEVEMIR) 100 UNIT/ML injection Inject 14 Units into the skin every morning.    [provider]  ipratropium (ATROVENT) 0.06 % nasal spray Place 1 spray into both nostrils at bedtime. 09/14/16   Jimmy Footman, MD  lisinopril (PRINIVIL,ZESTRIL) 2.5 MG tablet Take 2.5 mg by mouth daily.    [provider]  lithium carbonate (ESKALITH) 450 MG CR tablet Take 1 tablet (450 mg total) by mouth every 12 (twelve) hours. 09/14/16   Jimmy Footman, MD  metformin (FORTAMET) 500 MG (OSM) 24 hr tablet Take 500 mg by mouth 2 (two) times daily with a meal.    [provider]  mometasone-formoterol (DULERA) 200-5 MCG/ACT AERO Inhale 2 puffs into the lungs 2 (two) times daily. 11/20/16   Altamese Dilling, MD  omega-3 acid ethyl esters (LOVAZA) 1 g capsule Take 1 g by mouth daily.    [provider]  polyethylene glycol (MIRALAX / GLYCOLAX) packet Take 17 g by mouth daily. 09/15/16   Jimmy Footman, MD  sertraline (ZOLOFT) 50 MG tablet Take 1 tablet (50 mg total) by mouth daily. 10/22/17   McNew, Ileene Hutchinson, MD  simvastatin (ZOCOR) 40 MG tablet Take 40 mg by mouth at bedtime.    [provider]  traZODone (DESYREL) 50 MG tablet Take 75 mg by mouth at bedtime.    [provider]  warfarin (COUMADIN) 10 MG tablet Take 10 mg by mouth  daily.    [provider]    Allergies Penicillins  Family History  Problem Relation Age of Onset  . CAD Mother   . CAD Sister     Social History Social History   Tobacco Use  . Smoking status: Current Every Day Smoker    Packs/day: 1.00    Years: 23.00    Pack years: 23.00    Types: Cigarettes  . Smokeless tobacco: Never Used  . Tobacco comment: will provide material   Substance Use Topics  . Alcohol use: No    Comment: occassionally  . Drug use: Yes    Types: Marijuana    Comment: Last use 11/29/16    Review of Systems  Constitutional: No fever. Eyes: No redness. ENT: No sore throat. Cardiovascular: Denies chest pain. Respiratory: Denies shortness of breath. Gastrointestinal: No vomiting.  No blood in the stool. Genitourinary: Negative for dysuria.  Positive for hematuria. Musculoskeletal: Negative for back pain. Skin: Negative for rash. Neurological: Negative for headache.   ____________________________________________   PHYSICAL EXAM:  VITAL SIGNS: ED Triage Vitals  Enc Vitals Group     BP 11/24/17 1647 120/77     Pulse Rate 11/24/17 1647 96     Resp 11/24/17 1647 18     Temp 11/24/17 1647 98.7 F (37.1 C)     Temp Source 11/24/17 1647 Oral     SpO2 11/24/17 1647 100 %     Weight 11/24/17 1648 200 lb (90.7 kg)     Height 11/24/17 1648 5\' 10"  (1.778 m)     Head Circumference --      Peak Flow --      Pain Score 11/24/17 1648 0     Pain Loc --      Pain Edu? --      Excl. in GC? --     Constitutional: Alert and oriented. Well appearing and in no acute distress. Eyes: Conjunctivae are normal.  Head: Atraumatic. Nose: No congestion/rhinnorhea. Mouth/Throat: Mucous membranes are moist.   Neck: Normal range of motion.  Cardiovascular:  Good peripheral circulation. Respiratory: Normal respiratory effort.  Gastrointestinal: Soft and nontender. No distention.  Genitourinary: No CVA tenderness. Musculoskeletal: No lower extremity edema.  Extremities warm and well perfused.  Neurologic:  Normal speech and language. No gross focal neurologic deficits are appreciated.  Skin:  Skin is warm and dry. No rash noted. Psychiatric: Mood and affect are normal. Speech and behavior are normal.  ____________________________________________   LABS (all labs ordered are listed, but only abnormal results are displayed)  Labs Reviewed    URINALYSIS, COMPLETE (UACMP) WITH MICROSCOPIC - Abnormal; Notable for the following components:      Result Value   Color, Urine RED (*)    APPearance CLOUDY (*)    Hgb urine dipstick LARGE (*)    Protein, ur 100 (*)    RBC / HPF >50 (*)    All other components within normal limits  APTT - Abnormal; Notable for the following components:   aPTT 76 (*)    All other components within normal limits  PROTIME-INR - Abnormal; Notable for the following components:   Prothrombin Time 77.2 (*)    INR 9.68 (*)    All other components within normal limits  BASIC METABOLIC PANEL - Abnormal; Notable for the following components:   Glucose, Bld 140 (*)    All other components within normal limits  CBC WITH DIFFERENTIAL/PLATELET   ____________________________________________  EKG   ____________________________________________  RADIOLOGY  ____________________________________________   PROCEDURES  Procedure(s) performed: No  Procedures  Critical Care performed: No ____________________________________________   INITIAL IMPRESSION / ASSESSMENT AND PLAN / ED COURSE  Pertinent labs & imaging results that were available during my care of the patient were reviewed by me and considered in my medical decision making (see chart for details).  42 year old male with PMH as noted above and who is on Coumadin for PE presents with an episode of hematuria today.  He denies any other abnormal bleeding or bruising.  He states that his INR was checked recently and was normal.  On exam, the patient is well-appearing, vitals are normal, and the remainder of the exam is unremarkable.  Abdomen is soft and nontender.  Initial lab work-up reveals INR of 9.6.  The patient's hemoglobin is normal.  His UA shows RBCs but no WBCs or other findings to suggest infection.  Overall presentation is consistent with simple hematuria likely related to his elevated INR.  The patient has urinated in the ED and  states that it is now clear.  Given that he has no other active bleeding, normal hemoglobin, and the hematuria appears to be resolving, there is no indication for vitamin K or other repletion of coagulation factors with his INR and this level.  The patient feels well and would like to go home.  I instructed him to hold his Coumadin today and tomorrow, and to call his hematologist tomorrow to arrange for an INR check this week.  The patient expresses agreement with the plan.  The information was also provided in the discharge instructions.  Return precautions given, and he expresses understanding.  Clinical Course as of Nov 24 1921  Wed Nov 24, 2017  1816 Protein(!): 100 [CH]  1816 Protein(!): 100 [CH]  1816 Protein(!): 100 [CH]  1816 Protein(!): 100 [CH]  1816 Protein(!): 100 [CH]  1816 Protein(!): 100 [CH]    Clinical Course User Index [CH] Moody BruinsHayes, Candice A, Student-PA     ____________________________________________   FINAL CLINICAL IMPRESSION(S) / ED DIAGNOSES  Final diagnoses:  Gross hematuria  Elevated INR      NEW MEDICATIONS STARTED DURING THIS VISIT:  New Prescriptions   No medications on file     Note:  This document was prepared using Dragon voice recognition software and may include unintentional dictation errors.    Dionne BucySiadecki, Dora Simeone, MD 11/24/17 [phone]

## 2017-11-26 ENCOUNTER — Other Ambulatory Visit: Payer: Self-pay

## 2017-11-26 ENCOUNTER — Emergency Department
Admission: EM | Admit: 2017-11-26 | Discharge: 2017-11-27 | Disposition: A | Discharge status: Home / Self Care | Payer: Medicare Other | Attending: Student in an Organized Health Care Education/Training Program | Admitting: Student in an Organized Health Care Education/Training Program

## 2017-11-26 ENCOUNTER — Other Ambulatory Visit
Admission: RE | Admit: 2017-11-26 | Discharge: 2017-11-26 | Disposition: A | Discharge status: Home / Self Care | Payer: Medicare Other | Source: Ambulatory Visit | Attending: Internal Medicine | Admitting: Internal Medicine

## 2017-11-26 DIAGNOSIS — F17210 Nicotine dependence, cigarettes, uncomplicated: Secondary | ICD-10-CM | POA: Insufficient documentation

## 2017-11-26 DIAGNOSIS — E119 Type 2 diabetes mellitus without complications: Secondary | ICD-10-CM | POA: Insufficient documentation

## 2017-11-26 DIAGNOSIS — I2699 Other pulmonary embolism without acute cor pulmonale: Secondary | ICD-10-CM

## 2017-11-26 DIAGNOSIS — I10 Essential (primary) hypertension: Secondary | ICD-10-CM | POA: Diagnosis not present

## 2017-11-26 DIAGNOSIS — R791 Abnormal coagulation profile: Secondary | ICD-10-CM | POA: Insufficient documentation

## 2017-11-26 DIAGNOSIS — R310 Gross hematuria: Secondary | ICD-10-CM | POA: Insufficient documentation

## 2017-11-26 DIAGNOSIS — R319 Hematuria, unspecified: Secondary | ICD-10-CM | POA: Diagnosis not present

## 2017-11-26 DIAGNOSIS — Z794 Long term (current) use of insulin: Secondary | ICD-10-CM | POA: Diagnosis not present

## 2017-11-26 LAB — URINALYSIS, COMPLETE (UACMP) WITH MICROSCOPIC
RBC / HPF: >50 RBC/hpf — ABNORMAL HIGH (ref 0–5)
SPECIFIC GRAVITY, URINE: 1.019 (ref 1.005–1.030)
Squamous Epithelial / LPF: NONE SEEN (ref 0–5)

## 2017-11-26 LAB — BASIC METABOLIC PANEL
Anion gap: 6 (ref 5–15)
BUN: 15 mg/dL (ref 6–20)
CO2: 25 mmol/L (ref 22–32)
Calcium: 9.6 mg/dL (ref 8.9–10.3)
Chloride: 106 mmol/L (ref 101–111)
Creatinine, Ser: 0.89 mg/dL (ref 0.61–1.24)
GFR calc Af Amer: >60 mL/min (ref >60)
GFR calc non Af Amer: >60 mL/min (ref >60)
Glucose, Bld: 79 mg/dL (ref 65–99)
Potassium: 4 mmol/L (ref 3.5–5.1)
Sodium: 137 mmol/L (ref 135–145)

## 2017-11-26 LAB — CBC
HCT: 45.3 % (ref 40.0–52.0)
Hemoglobin: 16.1 g/dL (ref 13.0–18.0)
MCH: 32.5 pg (ref 26.0–34.0)
MCHC: 35.5 g/dL (ref 32.0–36.0)
MCV: 91.6 fL (ref 80.0–100.0)
Platelets: 204 10*3/uL (ref 150–440)
RBC: 4.94 MIL/uL (ref 4.40–5.90)
RDW: 13.1 % (ref 11.5–14.5)
WBC: 6.4 10*3/uL (ref 3.8–10.6)

## 2017-11-26 LAB — PROTIME-INR
INR: 10.38
INR: 9.68
Prothrombin Time: 81.5 seconds — ABNORMAL HIGH (ref 11.4–15.2)
Prothrombin Time: 77.2 seconds — ABNORMAL HIGH (ref 11.4–15.2)

## 2017-11-26 MED ORDER — CEPHALEXIN 500 MG PO CAPS: 500.0000 mg | ORAL_CAPSULE | Freq: Three times a day (TID) | ORAL | 0 refills | Status: AC

## 2017-11-26 MED ORDER — CEPHALEXIN 500 MG PO CAPS
500.0000 mg | ORAL_CAPSULE | Freq: Once | ORAL | Status: AC
  Administered 2017-11-26: 500 mg via ORAL
  Filled 2017-11-26: qty 1

## 2017-11-26 NOTE — Discharge Instructions (Addendum)
Do not take your Coumadin for the next 4 days.  Follow-up with Dr. Harl BowieMassoud for repeat INR check.  I will be giving her prescription for antibiotics to treat for any component of infection that might be causing this bleeding.  Return for any worsening fevers or any additional questions or concerns.

## 2017-11-26 NOTE — ED Notes (Signed)
Report given to Sherrie RN 

## 2017-11-26 NOTE — ED Provider Notes (Signed)
[facility] Emergency Department Provider Note    First MD Initiated Contact with Patient 11/26/17 2114     (approximate)  I have reviewed the triage vital signs and the nursing notes.   HISTORY  Chief Complaint Hematuria    HPI Lance [name] is a 42 y.o. male with a history of PE on Coumadin as well as schizoaffective disorder and frequent encounters for supratherapeutic INR presents to the ER with persistent hematuria.  Patient seen for same symptoms in the ER on the 12th of this month.  No evidence of infection at that time.  Was told to follow-up with PCP.  He did follow-up with PCP and had elevated INR and was instructed to hold his Coumadin for the next 4 days.  Patient was frustrated that his urine still had blood in it so he came to the ER.  Denies any fevers.  No nausea or vomiting.    Past Medical History:  Diagnosis Date  . Depression   . Diabetes mellitus without complication (HCC)   . Hyperlipidemia   . Hypertension   . Lupus anticoagulant disorder (HCC) 06/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  . PE (pulmonary thromboembolism) (HCC)   . Schizo affective schizophrenia (HCC)   . Supratherapeutic INR 11/19/2016   Family History  Problem Relation Age of Onset  . CAD Mother   . CAD Sister    History reviewed. No pertinent surgical history. Patient Active Problem List   Diagnosis Date Noted  . Schizophrenia (HCC) 10/15/2017  . 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 Tobacco use disorder 09/01/2016  . Schizoaffective disorder, bipolar type (HCC) 05/22/2016  . Diabetes mellitus without complication (HCC) 05/21/2016  . Hyperlipidemia 05/21/2016  . History of pulmonary embolism 05/21/2016  . Chronic anticoagulation 05/21/2016  . Hypertension 09/25/2015      Prior to Admission medications   Medication Sig Start Date End Date Taking? Authorizing Provider  albuterol (PROVENTIL HFA;VENTOLIN HFA) 108 (90 Base)  MCG/ACT inhaler Inhale 1-2 puffs into the lungs every 4 (four) hours as needed for wheezing or shortness of breath. 12/31/16   Jimmy Footman, MD  cephALEXin (KEFLEX) 500 MG capsule Take 1 capsule (500 mg total) by mouth 3 (three) times daily for 7 days. 11/26/17 12/03/17  Willy Eddy, MD  cloZAPine (CLOZARIL) 100 MG tablet Take 5 tablets (500 mg total) by mouth at bedtime. 12/03/16   Jimmy Footman, MD  Fluticasone-Salmeterol (ADVAIR) 250-50 MCG/DOSE AEPB Inhale 1 puff into the lungs 2 (two) times daily.    [provider]  insulin detemir (LEVEMIR) 100 UNIT/ML injection Inject 14 Units into the skin every morning.    [provider]  ipratropium (ATROVENT) 0.06 % nasal spray Place 1 spray into both nostrils at bedtime. 09/14/16   Jimmy Footman, MD  lisinopril (PRINIVIL,ZESTRIL) 2.5 MG tablet Take 2.5 mg by mouth daily.    [provider]  lithium carbonate (ESKALITH) 450 MG CR tablet Take 1 tablet (450 mg total) by mouth every 12 (twelve) hours. 09/14/16   Jimmy Footman, MD  metformin (FORTAMET) 500 MG (OSM) 24 hr tablet Take 500 mg by mouth 2 (two) times daily with a meal.    [provider]  mometasone-formoterol (DULERA) 200-5 MCG/ACT AERO Inhale 2 puffs into the lungs 2 (two) times daily. 11/20/16   Altamese Dilling, MD  omega-3 acid ethyl esters (LOVAZA) 1 g capsule Take 1 g by mouth daily.    [provider]  polyethylene glycol (MIRALAX /  GLYCOLAX) packet Take 17 g by mouth daily. 09/15/16   Jimmy Footman, MD  sertraline (ZOLOFT) 50 MG tablet Take 1 tablet (50 mg total) by mouth daily. 10/22/17   McNew, Ileene Hutchinson, MD  simvastatin (ZOCOR) 40 MG tablet Take 40 mg by mouth at bedtime.    [provider]  traZODone (DESYREL) 50 MG tablet Take 75 mg by mouth at bedtime.    [provider]  warfarin (COUMADIN) 10 MG tablet Take 10 mg by mouth daily.    [provider]      Allergies Penicillins    Social History Social History   Tobacco Use  . Smoking status: Current Every Day Smoker    Packs/day: 1.00    Years: 23.00    Pack years: 23.00    Types: Cigarettes  . Smokeless tobacco: Never Used  . Tobacco comment: will provide material  Substance Use Topics  . Alcohol use: No    Comment: occassionally  . Drug use: Yes    Types: Marijuana    Comment: Last use 11/29/16    Review of Systems Patient denies headaches, rhinorrhea, blurry vision, numbness, shortness of breath, chest pain, edema, cough, abdominal pain, nausea, vomiting, diarrhea, dysuria, fevers, rashes or hallucinations unless otherwise stated above in HPI. ____________________________________________   PHYSICAL EXAM:  VITAL SIGNS: Vitals:   11/26/17 1936  BP: 134/70  Pulse: (!) 101  Resp: 17  Temp: 98.1 F (36.7 C)  SpO2: 96%    Constitutional: Alert   Eyes: Conjunctivae are normal.  Head: Atraumatic. Nose: No congestion/rhinnorhea. Mouth/Throat: Mucous membranes are moist.   Neck: No stridor. Painless ROM.  Cardiovascular: Normal rate, regular rhythm. Grossly normal heart sounds.  Good peripheral circulation. Respiratory: Normal respiratory effort.  No retractions. Lungs CTAB. Gastrointestinal: Soft and nontender. No distention. No abdominal bruits. No CVA tenderness. Genitourinary: deferred Musculoskeletal: No lower extremity tenderness nor edema.  No joint effusions. Neurologic:  Normal speech and language. No gross focal neurologic deficits are appreciated. No facial droop Skin:  Skin is warm, dry and intact. No rash noted. Psychiatric: appropriate.  ____________________________________________   LABS (all labs ordered are listed, but only abnormal results are displayed)  Results for orders placed or performed during the hospital encounter of 11/26/17 (from the past 24 hour(s))  Urinalysis, Complete w Microscopic     Status: Abnormal   Collection Time:  11/26/17  7:41 PM  Result Value Ref Range   Color, Urine RED (A) YELLOW   APPearance CLOUDY (A) CLEAR   Specific Gravity, Urine 1.019 1.005 - 1.030   pH  5.0 - 8.0    TEST NOT REPORTED DUE TO COLOR INTERFERENCE OF URINE PIGMENT   Glucose, UA (A) NEGATIVE mg/dL    TEST NOT REPORTED DUE TO COLOR INTERFERENCE OF URINE PIGMENT   Hgb urine dipstick (A) NEGATIVE    TEST NOT REPORTED DUE TO COLOR INTERFERENCE OF URINE PIGMENT   Bilirubin Urine (A) NEGATIVE    TEST NOT REPORTED DUE TO COLOR INTERFERENCE OF URINE PIGMENT   Ketones, ur (A) NEGATIVE mg/dL    TEST NOT REPORTED DUE TO COLOR INTERFERENCE OF URINE PIGMENT   Protein, ur (A) NEGATIVE mg/dL    TEST NOT REPORTED DUE TO COLOR INTERFERENCE OF URINE PIGMENT   Nitrite (A) NEGATIVE    TEST NOT REPORTED DUE TO COLOR INTERFERENCE OF URINE PIGMENT   Leukocytes, UA (A) NEGATIVE    TEST NOT REPORTED DUE TO COLOR INTERFERENCE OF URINE PIGMENT   RBC / HPF >50 (  H) 0 - 5 RBC/hpf   WBC, UA 11-20 0 - 5 WBC/hpf   Bacteria, UA RARE (A) NONE SEEN   Squamous Epithelial / LPF NONE SEEN 0 - 5   ____________________________________________ ____________________________________________  RADIOLOGY   ____________________________________________   PROCEDURES  Procedure(s) performed:  Procedures    Critical Care performed: no ____________________________________________   INITIAL IMPRESSION / ASSESSMENT AND PLAN / ED COURSE  Pertinent labs & imaging results that were available during my care of the patient were reviewed by me and considered in my medical decision making (see chart for details).   DDX: uti, hematuria, supratherapeutic inr  Lance [name] is a 42 y.o. who presents to the ED with elevated INR on Coumadin with hematuria.  CBC checked today was stable.  Repeat INR in the ER repeated in the ER and is persistently elevated but downtrending from clinic today.  Patient's urinalysis does have rare bacteria therefore will start on  antibiotics for component of cystitis worsening the symptoms.  At this point do believe patient stable and appropriate for outpatient follow-up.     As part of my medical decision making, I reviewed the following data within the electronic MEDICAL RECORD NUMBER Nursing notes reviewed and incorporated, Labs reviewed, notes from prior ED visits.   ____________________________________________   FINAL CLINICAL IMPRESSION(S) / ED DIAGNOSES  Final diagnoses:  Gross hematuria  Supratherapeutic INR      NEW MEDICATIONS STARTED DURING THIS VISIT:  New Prescriptions   CEPHALEXIN (KEFLEX) 500 MG CAPSULE    Take 1 capsule (500 mg total) by mouth 3 (three) times daily for 7 days.     Note:  This document was prepared using Dragon voice recognition software and may include unintentional dictation errors.    Willy Eddyobinson, Kenan Moodie, MD 11/26/17 [phone]

## 2017-11-26 NOTE — ED Triage Notes (Addendum)
Pt arrives to ED via [facility] from [facility] in [place] with c/o hematuria "for a year and a half". Pt reports being seen for same here recently several times, but states "it keeps coming back". Pt denies any c/o N/V/D; no CP, ABD pain, or SHOB. PT HAS A LEGAL GUARDIAN: Pt reports her name is Lance DakinVivian [name], but unsure of contact information. He does report that the Group Home can be reliable to pick him up for transportation in the event of d/c from the ED. Pt reports he does take Coumadin; he states he was seen by his PCP for the same c/o today, and told to stop taking it for the next 4 days.

## 2017-11-27 ENCOUNTER — Other Ambulatory Visit: Payer: Self-pay

## 2017-11-27 DIAGNOSIS — R310 Gross hematuria: Secondary | ICD-10-CM | POA: Diagnosis not present

## 2017-11-27 MED ORDER — CEPHALEXIN 500 MG PO CAPS
500.0000 mg | ORAL_CAPSULE | Freq: Once | ORAL | Status: AC
  Administered 2017-11-27: 500 mg via ORAL
  Filled 2017-11-27: qty 1

## 2017-11-27 NOTE — ED Notes (Signed)
Patient is resting comfortably. 

## 2017-11-27 NOTE — ED Notes (Signed)
Pt given cup of orange juice

## 2017-11-27 NOTE — ED Notes (Signed)
Group Home Owner called [phone] states she is at her other job and thought someone had already come to the ED.

## 2017-11-27 NOTE — ED Notes (Addendum)
[facility], states she is the only one there right now and is not able to pick up the patient.

## 2017-11-27 NOTE — ED Notes (Signed)
Spoke with Lance [name] from group home, informed me that someone was on the way to pick up the patient. [phone]

## 2017-11-27 NOTE — ED Notes (Signed)
Pt resting quietly.

## 2017-11-27 NOTE — ED Notes (Signed)
Pt instructed to stop coumadin for the next 4 days, pt verbalized understanding of this and repeated back that he is to hold his coumadin for the next 4 days.

## 2017-11-27 NOTE — ED Notes (Signed)
Spoke with owner of the group home Jasmine DecemberSharon again for the 3rd time since shift starting at 0700.  Informed her that the patient has been discharged for almost 9.5 hours which is considered abandonment and that if someone does not come to pick up the patient within 30 minutes, DSS will be notified. Jasmine DecemberSharon stated that she is working at her other job in [place] that she cannot leave.  Per night shift notes, multiple attempts have been made to contact the group home and the owner during the night.  The employee at the group home told night shift they are the only staff member on at night and the owner stated to me that she did not hear her phone right during the night.

## 2017-11-27 NOTE — ED Notes (Signed)
Made contact with patient's legal guardian Lance [name] [phone] to inform her patient was seen in the ED and waiting for transportation from the group home.

## 2017-11-27 NOTE — ED Notes (Signed)
[facility] made with [facility]. Home does not have wake staff at night. Police went to house last night and woke staff and owner was contacted. No communication heard from owner. Spoke with staff this morning and got the number to the owner Jasmine DecemberSharon who will have to pick pt up due to staff is only one in home with the other patients. [phone].

## 2017-11-27 NOTE — ED Notes (Signed)
Second attempt to contact group [name] unsuccessful. Verified again with pt that group [name] had not changed. Pt states he still lives at Lance [name].

## 2017-11-27 NOTE — ED Notes (Signed)
Pt ambulatory to the bathroom 

## 2017-11-28 LAB — URINE CULTURE: Culture: NO GROWTH

## 2017-11-29 ENCOUNTER — Other Ambulatory Visit
Admission: RE | Admit: 2017-11-29 | Discharge: 2017-11-29 | Disposition: A | Discharge status: Home / Self Care | Payer: Medicare Other | Source: Ambulatory Visit | Attending: Internal Medicine | Admitting: Internal Medicine

## 2017-11-29 DIAGNOSIS — I2699 Other pulmonary embolism without acute cor pulmonale: Secondary | ICD-10-CM | POA: Diagnosis not present

## 2017-11-29 LAB — PROTIME-INR
INR: 1.61
Prothrombin Time: 19 seconds — ABNORMAL HIGH (ref 11.4–15.2)

## 2017-12-06 DIAGNOSIS — Z5181 Encounter for therapeutic drug level monitoring: Secondary | ICD-10-CM | POA: Diagnosis not present

## 2017-12-06 DIAGNOSIS — Z79899 Other long term (current) drug therapy: Secondary | ICD-10-CM | POA: Diagnosis not present

## 2018-01-14 ENCOUNTER — Other Ambulatory Visit: Payer: Self-pay

## 2018-01-14 ENCOUNTER — Emergency Department
Admission: EM | Admit: 2018-01-14 | Discharge: 2018-01-15 | Disposition: A | Discharge status: Other Acute Inpatient Hospital | Payer: Medicare Other | Attending: Emergency Medicine | Admitting: Emergency Medicine

## 2018-01-14 DIAGNOSIS — I10 Essential (primary) hypertension: Secondary | ICD-10-CM | POA: Diagnosis present

## 2018-01-14 DIAGNOSIS — F209 Schizophrenia, unspecified: Secondary | ICD-10-CM | POA: Insufficient documentation

## 2018-01-14 DIAGNOSIS — E785 Hyperlipidemia, unspecified: Secondary | ICD-10-CM | POA: Diagnosis present

## 2018-01-14 DIAGNOSIS — Z86711 Personal history of pulmonary embolism: Secondary | ICD-10-CM | POA: Diagnosis present

## 2018-01-14 DIAGNOSIS — Z79899 Other long term (current) drug therapy: Secondary | ICD-10-CM | POA: Insufficient documentation

## 2018-01-14 DIAGNOSIS — F17210 Nicotine dependence, cigarettes, uncomplicated: Secondary | ICD-10-CM | POA: Insufficient documentation

## 2018-01-14 DIAGNOSIS — Z7901 Long term (current) use of anticoagulants: Secondary | ICD-10-CM

## 2018-01-14 DIAGNOSIS — R45850 Homicidal ideations: Secondary | ICD-10-CM | POA: Insufficient documentation

## 2018-01-14 DIAGNOSIS — R45851 Suicidal ideations: Secondary | ICD-10-CM | POA: Insufficient documentation

## 2018-01-14 DIAGNOSIS — F203 Undifferentiated schizophrenia: Secondary | ICD-10-CM

## 2018-01-14 DIAGNOSIS — E119 Type 2 diabetes mellitus without complications: Secondary | ICD-10-CM

## 2018-01-14 DIAGNOSIS — F23 Brief psychotic disorder: Secondary | ICD-10-CM | POA: Diagnosis present

## 2018-01-14 LAB — CBC
HCT: 49.4 % (ref 40.0–52.0)
Hemoglobin: 17.3 g/dL (ref 13.0–18.0)
MCH: 32.1 pg (ref 26.0–34.0)
MCHC: 35.1 g/dL (ref 32.0–36.0)
MCV: 91.5 fL (ref 80.0–100.0)
Platelets: 275 10*3/uL (ref 150–440)
RBC: 5.39 MIL/uL (ref 4.40–5.90)
RDW: 13.1 % (ref 11.5–14.5)
WBC: 8 10*3/uL (ref 3.8–10.6)

## 2018-01-14 LAB — URINE DRUG SCREEN, QUALITATIVE (ARMC ONLY)
AMPHETAMINES, UR SCREEN: NOT DETECTED
BARBITURATES, UR SCREEN: NOT DETECTED
COCAINE METABOLITE, UR ~~LOC~~: NOT DETECTED
Cannabinoid 50 Ng, Ur ~~LOC~~: NOT DETECTED
MDMA (Ecstasy)Ur Screen: NOT DETECTED
METHADONE SCREEN, URINE: NOT DETECTED
Opiate, Ur Screen: NOT DETECTED
Phencyclidine (PCP) Ur S: NOT DETECTED
TRICYCLIC, UR SCREEN: NOT DETECTED

## 2018-01-14 LAB — DIFFERENTIAL
Basophils Absolute: 0 10*3/uL (ref 0–0.1)
Basophils Relative: 0 %
EOS ABS: 0 10*3/uL (ref 0–0.7)
EOS PCT: 0 %
LYMPHS PCT: 28 %
Lymphs Abs: 2.3 10*3/uL (ref 1.0–3.6)
MONOS PCT: 11 %
Monocytes Absolute: 0.9 10*3/uL (ref 0.2–1.0)
NEUTROS PCT: 61 %
Neutro Abs: 4.9 10*3/uL (ref 1.4–6.5)

## 2018-01-14 LAB — COMPREHENSIVE METABOLIC PANEL
ALK PHOS: 56 U/L (ref 38–126)
ALT: 30 U/L (ref 0–44)
ANION GAP: 12 (ref 5–15)
AST: 28 U/L (ref 15–41)
Albumin: 5.2 g/dL — ABNORMAL HIGH (ref 3.5–5.0)
BUN: 24 mg/dL — ABNORMAL HIGH (ref 6–20)
CALCIUM: 10 mg/dL (ref 8.9–10.3)
CHLORIDE: 108 mmol/L (ref 98–111)
CO2: 23 mmol/L (ref 22–32)
Creatinine, Ser: 1.14 mg/dL (ref 0.61–1.24)
GFR calc non Af Amer: >60 mL/min (ref >60)
Glucose, Bld: 128 mg/dL — ABNORMAL HIGH (ref 70–99)
POTASSIUM: 3.8 mmol/L (ref 3.5–5.1)
SODIUM: 143 mmol/L (ref 135–145)
Total Bilirubin: 0.8 mg/dL (ref 0.3–1.2)
Total Protein: 8.1 g/dL (ref 6.5–8.1)

## 2018-01-14 LAB — PROTIME-INR
INR: 1.46
Prothrombin Time: 17.6 seconds — ABNORMAL HIGH (ref 11.4–15.2)

## 2018-01-14 LAB — GLUCOSE, CAPILLARY: Glucose-Capillary: 206 mg/dL — ABNORMAL HIGH (ref 70–99)

## 2018-01-14 LAB — ETHANOL: Alcohol, Ethyl (B): <10 mg/dL (ref <10)

## 2018-01-14 LAB — SALICYLATE LEVEL

## 2018-01-14 LAB — ACETAMINOPHEN LEVEL

## 2018-01-14 MED ORDER — POLYETHYLENE GLYCOL 3350 17 G PO PACK
17.0000 g | PACK | Freq: Every day | ORAL | Status: DC
  Administered 2018-01-15: 17 g via ORAL
  Filled 2018-01-14 (×2): qty 1

## 2018-01-14 MED ORDER — MOMETASONE FURO-FORMOTEROL FUM 200-5 MCG/ACT IN AERO
2.0000 | INHALATION_SPRAY | Freq: Two times a day (BID) | RESPIRATORY_TRACT | Status: DC
  Administered 2018-01-14 – 2018-01-15 (×2): 2 via RESPIRATORY_TRACT
  Filled 2018-01-14: qty 8.8

## 2018-01-14 MED ORDER — LITHIUM CARBONATE 150 MG PO CAPS
450.0000 mg | ORAL_CAPSULE | Freq: Two times a day (BID) | ORAL | Status: DC
  Administered 2018-01-15: 450 mg via ORAL
  Filled 2018-01-14 (×3): qty 3

## 2018-01-14 MED ORDER — CLOZAPINE 100 MG PO TABS
500.0000 mg | ORAL_TABLET | Freq: Every day | ORAL | Status: DC
  Administered 2018-01-14: 500 mg via ORAL
  Filled 2018-01-14: qty 5

## 2018-01-14 MED ORDER — ALBUTEROL SULFATE HFA 108 (90 BASE) MCG/ACT IN AERS
2.0000 | INHALATION_SPRAY | Freq: Four times a day (QID) | RESPIRATORY_TRACT | Status: DC | PRN
  Filled 2018-01-14: qty 6.7

## 2018-01-14 MED ORDER — SERTRALINE HCL 50 MG PO TABS
50.0000 mg | ORAL_TABLET | Freq: Every day | ORAL | Status: DC
  Administered 2018-01-14 – 2018-01-15 (×2): 50 mg via ORAL
  Filled 2018-01-14 (×2): qty 1

## 2018-01-14 MED ORDER — INSULIN DETEMIR 100 UNIT/ML ~~LOC~~ SOLN
14.0000 [IU] | Freq: Every day | SUBCUTANEOUS | Status: DC
  Administered 2018-01-14: 14 [IU] via SUBCUTANEOUS
  Filled 2018-01-14 (×2): qty 0.14

## 2018-01-14 MED ORDER — WARFARIN SODIUM 10 MG PO TABS
10.0000 mg | ORAL_TABLET | Freq: Every day | ORAL | Status: DC
  Filled 2018-01-14: qty 1

## 2018-01-14 MED ORDER — SIMVASTATIN 40 MG PO TABS: 40.0000 mg | ORAL_TABLET | Freq: Every day | ORAL | Status: DC

## 2018-01-14 MED ORDER — METFORMIN HCL 500 MG PO TABS
500.0000 mg | ORAL_TABLET | Freq: Two times a day (BID) | ORAL | Status: DC
  Administered 2018-01-15: 500 mg via ORAL
  Filled 2018-01-14: qty 1

## 2018-01-14 MED ORDER — INSULIN ASPART 100 UNIT/ML ~~LOC~~ SOLN
0.0000 [IU] | Freq: Three times a day (TID) | SUBCUTANEOUS | Status: DC
  Administered 2018-01-15 (×2): 3 [IU] via SUBCUTANEOUS
  Filled 2018-01-14 (×2): qty 1

## 2018-01-14 MED ORDER — LISINOPRIL 5 MG PO TABS
2.5000 mg | ORAL_TABLET | Freq: Every day | ORAL | Status: DC
  Administered 2018-01-14 – 2018-01-15 (×2): 2.5 mg via ORAL
  Filled 2018-01-14 (×2): qty 1

## 2018-01-14 MED ORDER — TRAZODONE HCL 100 MG PO TABS
100.0000 mg | ORAL_TABLET | Freq: Every day | ORAL | Status: DC
  Administered 2018-01-14: 100 mg via ORAL
  Filled 2018-01-14: qty 1

## 2018-01-14 NOTE — Progress Notes (Signed)
Clozapine [facility] Monitoring Patient is a 42yo male with orders to continue Clozapine 500mg  at bedtime.  Verified eligibility with Cl[facility] website. Labs are acceptable. Next lab draw in 7 days.  [name], PharmD, BCPS 01/14/2018 8:08 PM

## 2018-01-14 NOTE — ED Notes (Signed)
Pt states, "There is one God, one Satan, and one anti-Christ.  Whatever happens, happens."  Pt endorsed having SI prior to arriving at the hospital.  Pt states,"I've been thinking negative.  There's some things you can change and some things you can't change."  Pt denies A/V hallucinations.  Does report visual hallucinations at times of lines.  Denies pain.  Appears to be in no acute distress.

## 2018-01-14 NOTE — ED Notes (Signed)
Pt. Given meal tray upon request and cup of water.

## 2018-01-14 NOTE — ED Triage Notes (Addendum)
[place] [facility] for [place] ideation - pt states that he has been having suicidal and homicidal ideation that got increasingly worse today - pt reports that he is "one step from homeless" - pt states that he is going to step in front of a transfer truck

## 2018-01-14 NOTE — ED Notes (Addendum)
Pt. Resting in room.  Pt. Calm and cooperative at this time.  Pt. States he talked to Dr. Antionette Poleslappacs today and discussion was made about inpatient treatment.   Pt. States feeling depressed today.

## 2018-01-14 NOTE — Consult Note (Signed)
[facility] Consult   Reason for Consult: Consult for this 42 year old man with schizophrenia comes into the emergency room claiming to be suicidal Referring Physician: Joni Fears Patient Identification: Lance [name] MRN:  [id number] Principal Diagnosis: Schizophrenia [facility]) Diagnosis:   Patient Active Problem List   Diagnosis Date Noted  . Schizophrenia ([place]) [F20.9] 10/15/2017  . Schizophrenia, undifferentiated ([place]) [F20.3] 09/20/2017  . Tobacco use disorder [F17.200] 09/01/2016  . Schizoaffective disorder, bipolar type ([place]) [F25.0] 05/22/2016  . Diabetes mellitus without complication ([place]) [Y10.1] 05/21/2016  . Hyperlipidemia [E78.5] 05/21/2016  . History of pulmonary embolism [Z86.711] 05/21/2016  . Chronic anticoagulation [Z79.01] 05/21/2016  . Hypertension [I10] 09/25/2015    Total Time spent with patient: 1 hour  Subjective:   Lance [name] is a 42 y.o. male patient admitted with "I am having suicidal and homicidal thoughts".  HPI: Patient voluntarily came to the emergency room.  He says that he hates his group home because they do not give him enough money he can smoke cigarettes they tell him what to do he does not like his roommate.  Consequently he started having "suicidal and homicidal thoughts".  He says he was thinking about killing everyone there with a gun or a knife.  Also thought about killing himself.  Patient says all of this in a rather matter of fact tone.  Also says he is having auditory hallucinations.  Compliant with medicine.  Denies drug abuse.  Social history: Has a legal guardian.  Resides in a group home.  Chronically dissatisfied with it.  Medical history: Multiple medical problems including chronic anticoagulation because of pulmonary embolism, diabetes, high blood pressure  Substance abuse history: Past use of cannabis but does not appear to have much of a problem with it anymore.  Still smokes a lot.  Past Psychiatric History:  Well-known to the psychiatric service multiple hospitalizations.  Does have a past history of suicidality and chronic psychotic symptoms.  Maintained on clozapine.  Occasional violence usually not bad when he is not too angry.  Risk to Self:   Risk to Others:   Prior Inpatient Therapy:   Prior Outpatient Therapy:    Past Medical History:  Past Medical History:  Diagnosis Date  . Depression   . Diabetes mellitus without complication ([place])   . Hyperlipidemia   . Hypertension   . Lupus anticoagulant disorder ([place]) 06/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  . PE (pulmonary thromboembolism) ([place])   . Schizo affective schizophrenia ([place])   . Supratherapeutic INR 11/19/2016   History reviewed. No pertinent surgical history. Family History:  Family History  Problem Relation Age of Onset  . CAD Mother   . CAD Sister    Family Psychiatric  History: Unknown Social History:  Social History   Substance and Sexual Activity  Alcohol Use No   Comment: occassionally     Social History   Substance and Sexual Activity  Drug Use Yes  . Types: Marijuana   Comment: Last use 11/29/16    Social History   Socioeconomic History  . Marital status: Single    Spouse name: Not on file  . Number of children: Not on file  . Years of education: Not on file  . Highest education level: Not on file  Occupational History  . Not on file  Social Needs  . Financial resource strain: Not on file  . Food insecurity:    Worry: Not on file    Inability: Not on file  .  Transportation needs:    Medical: Not on file    Non-medical: Not on file  Tobacco Use  . Smoking status: Current Every Day Smoker    Packs/day: 1.00    Years: 23.00    Pack years: 23.00    Types: Cigarettes  . Smokeless tobacco: Never Used  . Tobacco comment: will provide material  Substance and Sexual Activity  . Alcohol use: No    Comment: occassionally  . Drug use: Yes    Types: Marijuana    Comment: Last use 11/29/16   . Sexual activity: Never    Birth control/protection: None    Comment: occasional marijuana- none recently  Lifestyle  . Physical activity:    Days per week: Not on file    Minutes per session: Not on file  . Stress: Not on file  Relationships  . Social connections:    Talks on phone: Not on file    Gets together: Not on file    Attends religious service: Not on file    Active member of club or organization: Not on file    Attends meetings of clubs or organizations: Not on file    Relationship status: Not on file  Other Topics Concern  . Not on file  Social History Narrative   From a group home in [place]   Additional Social History:    Allergies:   Allergies  Allergen Reactions  . Penicillins Other (See Comments)    Reaction: "lockjaw" Has patient had a PCN reaction causing immediate rash, facial/tongue/throat swelling, SOB or lightheadedness with hypotension: No Has patient had a PCN reaction causing severe rash involving mucus membranes or skin necrosis: No Has patient had a PCN reaction that required hospitalization: No Has patient had a PCN reaction occurring within the last 10 years: No If all of the above answers are "NO", then may proceed with Cephalosporin use.     Labs:  Results for orders placed or performed during the hospital encounter of 01/14/18 (from the past 48 hour(s))  Comprehensive metabolic panel     Status: Abnormal   Collection Time: 01/14/18  5:20 PM  Result Value Ref Range   Sodium 143 135 - 145 mmol/L   Potassium 3.8 3.5 - 5.1 mmol/L   Chloride 108 98 - 111 mmol/L   CO2 23 22 - 32 mmol/L   Glucose, Bld 128 (H) 70 - 99 mg/dL   BUN 24 (H) 6 - 20 mg/dL   Creatinine, Ser 1.14 0.61 - 1.24 mg/dL   Calcium 10.0 8.9 - 10.3 mg/dL   Total Protein 8.1 6.5 - 8.1 g/dL   Albumin 5.2 (H) 3.5 - 5.0 g/dL   AST 28 15 - 41 U/L   ALT 30 0 - 44 U/L   Alkaline Phosphatase 56 38 - 126 U/L   Total Bilirubin 0.8 0.3 - 1.2 mg/dL   GFR calc non Af Amer >60  >60 mL/min   GFR calc Af Amer >60 >60 mL/min    Comment: (NOTE) The eGFR has been calculated using the CKD EPI equation. This calculation has not been validated in all clinical situations. eGFR's persistently <60 mL/min signify possible Chronic Kidney Disease.    Anion gap 12 5 - 15    Comment: Performed at [facility] [address] [place], [place] [address]  Ethanol     Status: None   Collection Time: 01/14/18  5:20 PM  Result Value Ref Range   Alcohol, Ethyl (B) <10 <10 mg/dL  Comment: (NOTE) Lowest detectable limit for serum alcohol is 10 mg/dL. For medical purposes only. Performed at [facility], [address] [place], [place] [address]   Salicylate level     Status: None   Collection Time: 01/14/18  5:20 PM  Result Value Ref Range   Salicylate Lvl <6.1 2.8 - 30.0 mg/dL    Comment: Performed at [facility], [address] [place], [place] [address]  Acetaminophen level     Status: Abnormal   Collection Time: 01/14/18  5:20 PM  Result Value Ref Range   Acetaminophen (Tylenol), Serum <10 (L) 10 - 30 ug/mL    Comment: (NOTE) Therapeutic concentrations vary significantly. A range of 10-30 ug/mL  may be an effective concentration for many patients. However, some  are best treated at concentrations outside of this range. Acetaminophen concentrations >150 ug/mL at 4 hours after ingestion  and >50 ug/mL at 12 hours after ingestion are often associated with  toxic reactions. Performed at [facility], [address] [place], [place] [address]   cbc     Status: None   Collection Time: 01/14/18  5:20 PM  Result Value Ref Range   WBC 8.0 3.8 - 10.6 K/uL   RBC 5.39 4.40 - 5.90 MIL/uL   Hemoglobin 17.3 13.0 - 18.0 g/dL   HCT 49.4 40.0 - 52.0 %   MCV 91.5 80.0 - 100.0 fL   MCH 32.1 26.0 - 34.0 pg   MCHC 35.1 32.0 - 36.0 g/dL   RDW 13.1 11.5 - 14.5 %   Platelets 275 150 - 440 K/uL    Comment: Performed at [facility], [address], [place], [place] [address]    Current Facility-Administered Medications  Medication Dose Route Frequency Provider Last Rate Last Dose  . albuterol (PROVENTIL HFA;VENTOLIN HFA) 108 (90 Base) MCG/ACT inhaler 2 puff  2 puff Inhalation Q6H PRN Jaecob Lowden T, MD      . cloZAPine (CLOZARIL) tablet 500 mg  500 mg Oral QHS Jocelynn Gioffre T, MD      . Derrill Memo ON 01/15/2018] insulin aspart (novoLOG) injection 0-15 Units  0-15 Units Subcutaneous TID WC Jelitza Manninen T, MD      . insulin detemir (LEVEMIR) injection 14 Units  14 Units Subcutaneous Daily Pradeep Beaubrun T, MD      . lisinopril (PRINIVIL,ZESTRIL) tablet 2.5 mg  2.5 mg Oral Daily Briani Maul, Madie Reno, MD      . Derrill Memo ON 01/15/2018] lithium carbonate capsule 450 mg  450 mg Oral BID WC Yuktha Kerchner T, MD      . Derrill Memo ON 01/15/2018] metFORMIN (GLUCOPHAGE) tablet 500 mg  500 mg Oral BID WC Brandom Kerwin T, MD      . mometasone-formoterol (DULERA) 200-5 MCG/ACT inhaler 2 puff  2 puff Inhalation BID Lavanna Rog T, MD      . polyethylene glycol (MIRALAX / GLYCOLAX) packet 17 g  17 g Oral Daily Ruddy Swire T, MD      . sertraline (ZOLOFT) tablet 50 mg  50 mg Oral Daily Lennart Gladish T, MD      . Derrill Memo ON 01/15/2018] simvastatin (ZOCOR) tablet 40 mg  40 mg Oral q1800 Hamzeh Tall T, MD      . traZODone (DESYREL) tablet 100 mg  100 mg Oral QHS Destyn Schuyler, Madie Reno, MD      . Derrill Memo ON 01/15/2018] warfarin (COUMADIN) tablet 10 mg  10 mg Oral q1800 Jennfier Abdulla, Madie Reno, MD  Current Outpatient Medications  Medication Sig Dispense Refill  . albuterol (PROVENTIL HFA;VENTOLIN HFA) 108 (90 Base) MCG/ACT inhaler Inhale 1-2 puffs into the lungs every 4 (four) hours as needed for wheezing or shortness of breath. 1 Inhaler 0  . cloZAPine (CLOZARIL) 100 MG tablet Take 5 tablets (500 mg total) by mouth at bedtime. 150 tablet 0  . Fluticasone-Salmeterol (ADVAIR) 250-50 MCG/DOSE AEPB Inhale 1 puff into the lungs 2 (two) times daily.    . insulin detemir  (LEVEMIR) 100 UNIT/ML injection Inject 14 Units into the skin every morning.    Marland Kitchen ipratropium (ATROVENT) 0.06 % nasal spray Place 1 spray into both nostrils at bedtime. 3 mL 0  . lisinopril (PRINIVIL,ZESTRIL) 2.5 MG tablet Take 2.5 mg by mouth daily.    Marland Kitchen lithium carbonate (ESKALITH) 450 MG CR tablet Take 1 tablet (450 mg total) by mouth every 12 (twelve) hours. 60 tablet 0  . metformin (FORTAMET) 500 MG (OSM) 24 hr tablet Take 500 mg by mouth 2 (two) times daily with a meal.    . mometasone-formoterol (DULERA) 200-5 MCG/ACT AERO Inhale 2 puffs into the lungs 2 (two) times daily. 1 Inhaler 1  . omega-3 acid ethyl esters (LOVAZA) 1 g capsule Take 1 g by mouth daily.    . polyethylene glycol (MIRALAX / GLYCOLAX) packet Take 17 g by mouth daily. 30 each 0  . sertraline (ZOLOFT) 50 MG tablet Take 1 tablet (50 mg total) by mouth daily. 30 tablet 0  . simvastatin (ZOCOR) 40 MG tablet Take 40 mg by mouth at bedtime.    . traZODone (DESYREL) 50 MG tablet Take 75 mg by mouth at bedtime.    Marland Kitchen warfarin (COUMADIN) 10 MG tablet Take 10 mg by mouth daily.      Musculoskeletal: Strength & Muscle Tone: within normal limits Gait & Station: normal Patient leans: N/A  Psychiatric Specialty Exam: Physical Exam  Nursing note and vitals reviewed. Constitutional: He appears well-developed and well-nourished.  HENT:  Head: Normocephalic and atraumatic.  Eyes: Pupils are equal, round, and reactive to light. Conjunctivae are normal.  Neck: Normal range of motion.  Cardiovascular: Regular rhythm and normal heart sounds.  Respiratory: Effort normal.  GI: Soft.  Musculoskeletal: Normal range of motion.  Neurological: He is alert.  Skin: Skin is warm and dry.  Psychiatric: His speech is normal and behavior is normal. His affect is blunt. Cognition and memory are impaired. He expresses impulsivity. He expresses homicidal and suicidal ideation.    Review of Systems  Constitutional: Negative.   HENT: Negative.    Eyes: Negative.   Respiratory: Negative.   Cardiovascular: Negative.   Gastrointestinal: Negative.   Musculoskeletal: Negative.   Skin: Negative.   Neurological: Negative.   Psychiatric/Behavioral: Positive for depression, hallucinations, memory loss and suicidal ideas. Negative for substance abuse. The patient is nervous/anxious and has insomnia.     Blood pressure 107/69, pulse 100, temperature 98.7 F (37.1 C), temperature source Oral, resp. rate 16, height _0  (1.778 m), weight 90.7 kg (200 lb), SpO2 97 %.Body mass index is 28.7 kg/m.  General Appearance: Disheveled  Eye Contact:  Good  Speech:  Clear and Coherent  Volume:  Normal  Mood:  Dysphoric  Affect:  Congruent  Thought Process:  Goal Directed  Orientation:  Full (Time, Place, and Person)  Thought Content:  Logical  Suicidal Thoughts:  Yes.  with intent/plan  Homicidal Thoughts:  Yes.  with intent/plan  Memory:  Immediate;   Fair Recent;   Fair  Remote;   Fair  Judgement:  Impaired  Insight:  Shallow  Psychomotor Activity:  Decreased  Concentration:  Concentration: Poor  Recall:  [employer] of Knowledge:  Fair  Language:  Fair  Akathisia:  No  Handed:  Right  AIMS (if indicated):     Assets:  Housing  ADL's:  Impaired  Cognition:  Impaired,  Mild  Sleep:        Treatment Plan Summary: Daily contact with patient to assess and evaluate symptoms and progress in treatment, Medication management and Plan Patient endorsing suicidal and homicidal thoughts.  Chronic schizophrenia.  Well-known to the service.  Labs stable.  Does not appear intoxicated.  Admit to psychiatric service.  Continue current medicine.  15-minute checks.  Labs will be completed.  Disposition: Recommend psychiatric Inpatient admission when medically cleared.  Alethia Berthold, MD 01/14/2018 6:22 PM

## 2018-01-14 NOTE — ED Notes (Signed)
Pt given meal tray.

## 2018-01-15 ENCOUNTER — Inpatient Hospital Stay
Admission: AD | Admit: 2018-01-15 | Discharge: 2018-01-21 | DRG: 885 | Disposition: A | Discharge status: Home / Self Care | Payer: Medicare Other | Source: Intra-hospital | Attending: Psychiatry | Admitting: Psychiatry

## 2018-01-15 ENCOUNTER — Other Ambulatory Visit: Payer: Self-pay

## 2018-01-15 DIAGNOSIS — Z880 Allergy status to penicillin: Secondary | ICD-10-CM

## 2018-01-15 DIAGNOSIS — Z818 Family history of other mental and behavioral disorders: Secondary | ICD-10-CM | POA: Diagnosis not present

## 2018-01-15 DIAGNOSIS — Z7951 Long term (current) use of inhaled steroids: Secondary | ICD-10-CM | POA: Diagnosis not present

## 2018-01-15 DIAGNOSIS — L600 Ingrowing nail: Secondary | ICD-10-CM | POA: Diagnosis present

## 2018-01-15 DIAGNOSIS — Z7901 Long term (current) use of anticoagulants: Secondary | ICD-10-CM | POA: Diagnosis not present

## 2018-01-15 DIAGNOSIS — Z794 Long term (current) use of insulin: Secondary | ICD-10-CM | POA: Diagnosis not present

## 2018-01-15 DIAGNOSIS — F259 Schizoaffective disorder, unspecified: Secondary | ICD-10-CM | POA: Diagnosis not present

## 2018-01-15 DIAGNOSIS — E119 Type 2 diabetes mellitus without complications: Secondary | ICD-10-CM | POA: Diagnosis present

## 2018-01-15 DIAGNOSIS — Z79899 Other long term (current) drug therapy: Secondary | ICD-10-CM

## 2018-01-15 DIAGNOSIS — F209 Schizophrenia, unspecified: Secondary | ICD-10-CM | POA: Diagnosis present

## 2018-01-15 DIAGNOSIS — M79674 Pain in right toe(s): Secondary | ICD-10-CM | POA: Diagnosis not present

## 2018-01-15 DIAGNOSIS — R45850 Homicidal ideations: Secondary | ICD-10-CM | POA: Diagnosis not present

## 2018-01-15 DIAGNOSIS — F429 Obsessive-compulsive disorder, unspecified: Secondary | ICD-10-CM | POA: Diagnosis present

## 2018-01-15 DIAGNOSIS — J45909 Unspecified asthma, uncomplicated: Secondary | ICD-10-CM | POA: Diagnosis present

## 2018-01-15 DIAGNOSIS — R45851 Suicidal ideations: Secondary | ICD-10-CM | POA: Diagnosis present

## 2018-01-15 DIAGNOSIS — K5900 Constipation, unspecified: Secondary | ICD-10-CM | POA: Diagnosis present

## 2018-01-15 DIAGNOSIS — Z86711 Personal history of pulmonary embolism: Secondary | ICD-10-CM

## 2018-01-15 DIAGNOSIS — F23 Brief psychotic disorder: Secondary | ICD-10-CM | POA: Diagnosis present

## 2018-01-15 DIAGNOSIS — E785 Hyperlipidemia, unspecified: Secondary | ICD-10-CM | POA: Diagnosis present

## 2018-01-15 DIAGNOSIS — Z8249 Family history of ischemic heart disease and other diseases of the circulatory system: Secondary | ICD-10-CM

## 2018-01-15 DIAGNOSIS — F250 Schizoaffective disorder, bipolar type: Secondary | ICD-10-CM | POA: Diagnosis not present

## 2018-01-15 DIAGNOSIS — I10 Essential (primary) hypertension: Secondary | ICD-10-CM | POA: Diagnosis present

## 2018-01-15 DIAGNOSIS — F17210 Nicotine dependence, cigarettes, uncomplicated: Secondary | ICD-10-CM | POA: Diagnosis present

## 2018-01-15 DIAGNOSIS — D6862 Lupus anticoagulant syndrome: Secondary | ICD-10-CM | POA: Diagnosis present

## 2018-01-15 DIAGNOSIS — F203 Undifferentiated schizophrenia: Secondary | ICD-10-CM | POA: Diagnosis not present

## 2018-01-15 DIAGNOSIS — M79675 Pain in left toe(s): Secondary | ICD-10-CM | POA: Diagnosis not present

## 2018-01-15 DIAGNOSIS — F319 Bipolar disorder, unspecified: Secondary | ICD-10-CM

## 2018-01-15 LAB — GLUCOSE, CAPILLARY
GLUCOSE-CAPILLARY: 197 mg/dL — AB (ref 70–99)
GLUCOSE-CAPILLARY: 180 mg/dL — AB (ref 70–99)

## 2018-01-15 MED ORDER — POLYETHYLENE GLYCOL 3350 17 G PO PACK
17.0000 g | PACK | Freq: Every day | ORAL | Status: DC
  Administered 2018-01-15 – 2018-01-20 (×3): 17 g via ORAL
  Filled 2018-01-15 (×5): qty 1

## 2018-01-15 MED ORDER — CLOZAPINE 100 MG PO TABS
500.0000 mg | ORAL_TABLET | Freq: Every day | ORAL | Status: DC
  Administered 2018-01-15 – 2018-01-20 (×6): 500 mg via ORAL
  Filled 2018-01-15 (×7): qty 5

## 2018-01-15 MED ORDER — SIMVASTATIN 40 MG PO TABS
40.0000 mg | ORAL_TABLET | Freq: Every day | ORAL | Status: DC
  Administered 2018-01-15 – 2018-01-20 (×6): 40 mg via ORAL
  Filled 2018-01-15 (×5): qty 1

## 2018-01-15 MED ORDER — WARFARIN - PHARMACIST DOSING INPATIENT
Freq: Every day | Status: DC
  Administered 2018-01-16 – 2018-01-18 (×3)
  Administered 2018-01-19: 1
  Administered 2018-01-20: 17:00:00

## 2018-01-15 MED ORDER — LISINOPRIL 2.5 MG PO TABS
2.5000 mg | ORAL_TABLET | Freq: Every day | ORAL | Status: DC
  Administered 2018-01-15 – 2018-01-21 (×7): 2.5 mg via ORAL
  Filled 2018-01-15 (×7): qty 1

## 2018-01-15 MED ORDER — MOMETASONE FURO-FORMOTEROL FUM 200-5 MCG/ACT IN AERO
2.0000 | INHALATION_SPRAY | Freq: Two times a day (BID) | RESPIRATORY_TRACT | Status: DC
  Administered 2018-01-15 – 2018-01-20 (×9): 2 via RESPIRATORY_TRACT
  Filled 2018-01-15: qty 8.8

## 2018-01-15 MED ORDER — SERTRALINE HCL 25 MG PO TABS
50.0000 mg | ORAL_TABLET | Freq: Every day | ORAL | Status: DC
  Administered 2018-01-15 – 2018-01-21 (×7): 50 mg via ORAL
  Filled 2018-01-15 (×7): qty 2

## 2018-01-15 MED ORDER — INSULIN DETEMIR 100 UNIT/ML ~~LOC~~ SOLN
14.0000 [IU] | Freq: Every day | SUBCUTANEOUS | Status: DC
  Administered 2018-01-15 – 2018-01-20 (×6): 14 [IU] via SUBCUTANEOUS
  Filled 2018-01-15 (×7): qty 0.14

## 2018-01-15 MED ORDER — ALBUTEROL SULFATE HFA 108 (90 BASE) MCG/ACT IN AERS
2.0000 | INHALATION_SPRAY | Freq: Four times a day (QID) | RESPIRATORY_TRACT | Status: DC | PRN
  Administered 2018-01-17: 2 via RESPIRATORY_TRACT
  Filled 2018-01-15: qty 6.7

## 2018-01-15 MED ORDER — TRAZODONE HCL 100 MG PO TABS
100.0000 mg | ORAL_TABLET | Freq: Every day | ORAL | Status: DC
  Administered 2018-01-15 – 2018-01-20 (×6): 100 mg via ORAL
  Filled 2018-01-15 (×6): qty 1

## 2018-01-15 MED ORDER — LITHIUM CARBONATE 300 MG PO CAPS
450.0000 mg | ORAL_CAPSULE | Freq: Two times a day (BID) | ORAL | Status: DC
  Administered 2018-01-15 – 2018-01-21 (×12): 450 mg via ORAL
  Filled 2018-01-15 (×13): qty 1

## 2018-01-15 MED ORDER — ACETAMINOPHEN 325 MG PO TABS: 650.0000 mg | ORAL_TABLET | Freq: Four times a day (QID) | ORAL | Status: DC | PRN

## 2018-01-15 MED ORDER — INSULIN ASPART 100 UNIT/ML ~~LOC~~ SOLN
0.0000 [IU] | Freq: Three times a day (TID) | SUBCUTANEOUS | Status: DC
  Administered 2018-01-16: 2 [IU] via SUBCUTANEOUS
  Administered 2018-01-17: 3 [IU] via SUBCUTANEOUS
  Administered 2018-01-20: 2 [IU] via SUBCUTANEOUS
  Administered 2018-01-21: 5 [IU] via SUBCUTANEOUS
  Filled 2018-01-15 (×4): qty 1

## 2018-01-15 MED ORDER — METFORMIN HCL 500 MG PO TABS
500.0000 mg | ORAL_TABLET | Freq: Two times a day (BID) | ORAL | Status: DC
  Administered 2018-01-15 – 2018-01-21 (×12): 500 mg via ORAL
  Filled 2018-01-15 (×12): qty 1

## 2018-01-15 MED ORDER — MAGNESIUM HYDROXIDE 400 MG/5ML PO SUSP: 30.0000 mL | Freq: Every day | ORAL | Status: DC | PRN

## 2018-01-15 MED ORDER — WARFARIN SODIUM 10 MG PO TABS
10.0000 mg | ORAL_TABLET | Freq: Every day | ORAL | Status: DC
  Filled 2018-01-15: qty 1

## 2018-01-15 MED ORDER — ALUM & MAG HYDROXIDE-SIMETH 200-200-20 MG/5ML PO SUSP: 30.0000 mL | ORAL | Status: DC | PRN

## 2018-01-15 MED ORDER — WARFARIN SODIUM 10 MG PO TABS
10.0000 mg | ORAL_TABLET | Freq: Every day | ORAL | Status: DC
  Administered 2018-01-15 – 2018-01-19 (×5): 10 mg via ORAL
  Filled 2018-01-15 (×6): qty 1

## 2018-01-15 NOTE — Progress Notes (Signed)
Patient ID: Lance [name], male   DOB: Oct 18, 1975, 42 y.o.   MRN: [id number] Per State regulations 482.30 this chart was reviewed for medical necessity with respect to the patient's admission/duration of stay.    Next review date:01/19/18  Thurman CoyerEric Rondell Frick, BSN, RN-BC  Case Manager

## 2018-01-15 NOTE — Tx Team (Signed)
Initial Treatment Plan 01/15/2018 4:53 PM Lance AllanJames Paul Klenke FAO:[id number]    PATIENT STRESSORS: Educational concerns Financial difficulties Health problems   PATIENT STRENGTHS: Ability for insight Communication skills Motivation for treatment/growth   PATIENT IDENTIFIED PROBLEMS: Suicidal Ideations  Homicidal ideations  Unstable mood  Ineffective Coping skills               DISCHARGE CRITERIA:  Ability to meet basic life and health needs Adequate post-discharge living arrangements Improved stabilization in mood, thinking, and/or behavior  PRELIMINARY DISCHARGE PLAN: Attend aftercare/continuing care group Attend 12-step recovery group Outpatient therapy Placement in alternative living arrangements  PATIENT/FAMILY INVOLVEMENT: This treatment plan has been presented to and reviewed with the patient, Lance [name].  The patient and family have been given the opportunity to ask questions and make suggestions.  Berkley HarveySlade I Ivyanna Sibert, RN 01/15/2018, 4:53 PM

## 2018-01-15 NOTE — Progress Notes (Signed)
[facility] at 14[facility]  Admission Note:  D: Pt appeared depressed  With  a flat affect.  Pt reports suicidal ideations stated he wanted to kill people at the group home. Patient stated he is tiried of  Being there with them taking his money and not being able to go any where. Stated he doesn't like his roommate Pt is redirectable and cooperative with assessment.      A: Pt admitted to unit per protocol, skin assessment and search done and no contraband found with Jasmine MHT .  Pt  educated on therapeutic milieu rules. Pt was introduced to milieu by nursing staff.    R: Pt was receptive to education about the milieu .  15 min safety checks started. Clinical research associatewriter offered support

## 2018-01-15 NOTE — Progress Notes (Signed)
ANTICOAGULATION CONSULT NOTE - Initial Consult  Pharmacy Consult for warfarin Indication: history of PE (2010)  Allergies  Allergen Reactions  . Penicillins Other (See Comments)    Reaction: "lockjaw" Has patient had a PCN reaction causing immediate rash, facial/tongue/throat swelling, SOB or lightheadedness with hypotension: No Has patient had a PCN reaction causing severe rash involving mucus membranes or skin necrosis: No Has patient had a PCN reaction that required hospitalization: No Has patient had a PCN reaction occurring within the last 10 years: No If all of the above answers are "NO", then may proceed with Cephalosporin use.     Patient Measurements: Height: 5\' 10"  (177.8 cm) Weight: 200 lb (90.7 kg) IBW/kg (Calculated) : 73 Heparin Dosing Weight:   Vital Signs:    Labs: Recent Labs    01/14/18 1720 01/14/18 2022  HGB 17.3  --   HCT 49.4  --   PLT 275  --   LABPROT  --  17.6*  INR  --  1.46  CREATININE 1.14  --     Estimated Creatinine Clearance: 94.7 mL/min (by C-G formula based on SCr of 1.14 mg/dL).   Medical History: Past Medical History:  Diagnosis Date  . Depression   . Diabetes mellitus without complication (H[facility])   . Hyperlipidemia   . Hypertension   . Lupus anticoagulant disorder ([facility]) 06/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  . PE (pulmonary thromboembolism) ([facility])   . Schizo affective schizophrenia (H[facility])   . Supratherapeutic INR 11/19/2016    Medications:  Infusions:    Assessment: 42 yom cc SI with PMH DM, depression, HLD, HTN with acute psychiatric complaints. Pharmacy consulted to manage VKA while IP. Patient takes warfarin 10 mg po daily PTA  DATE INR DOSE  8/2 1.43 Missed 8/3 N/A   Goal of Therapy:  INR 2-3 Monitor platelets by anticoagulation protocol: Yes   Plan:  Resume PTA warfarin 10 mg po daily. Will monitor INR daily until therapeutic x 2.  Lance FrostNathan A Akaya Proffit, Pharm.D., BCPS Clinical  Pharmacist 01/15/2018,8:32 AM

## 2018-01-15 NOTE — BH Assessment (Signed)
This Clinical research associatewriter called patient legal guardian Lance [name] [phone] and informed her that patient is being IVC'd. Legal guardian stated she is agreement with patient being IVC'd.

## 2018-01-15 NOTE — BH Assessment (Signed)
Patient is to be admitted [facility] BM[facility] by Dr. Toni Amendlapacs.  Attending Physician will be Dr. Johnella MoloneyMcNew.   Patient has been [facility] to room 310, [facility] Gwen.   Intake Paper Work has been signed and placed on patient chart.  ER staff is aware of the admission:  Ronnie: ER Secretary   Dr. Fanny BienQuale: ER MD   Lincoln Maxinlivette: Patient's Nurse   Marylene LandAngela: Patient Access.

## 2018-01-15 NOTE — ED Provider Notes (Signed)
[facility] Emergency Department Provider Note  ____________________________________________  Time seen: Approximately 12:09 AM  I have reviewed the triage vital signs and the nursing notes.   HISTORY  Chief Complaint Suicidal    HPI Lance [name] is a 42 y.o. male with a history of depression diabetes hyperlipidemia and hypertension who complains of feeling depressed today he feels suicidal and wants to step out into traffic to kill himself.   No acute medical complaints.  Symptoms are waxing and waning, no aggravating or alleviating factors.  Does make strange religious references as well.     Past Medical History:  Diagnosis Date  . Depression   . Diabetes mellitus without complication (HCC)   . Hyperlipidemia   . Hypertension   . Lupus anticoagulant disorder (HCC) 06/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  . PE (pulmonary thromboembolism) (HCC)   . Schizo affective schizophrenia (HCC)   . Supratherapeutic INR 11/19/2016     Patient Active Problem List   Diagnosis Date Noted  . Schizophrenia (HCC) 10/15/2017  . 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 Tobacco use disorder 09/01/2016  . Schizoaffective disorder, bipolar type (HCC) 05/22/2016  . Diabetes mellitus without complication (HCC) 05/21/2016  . Hyperlipidemia 05/21/2016  . History of pulmonary embolism 05/21/2016  . Chronic anticoagulation 05/21/2016  . Hypertension 09/25/2015     History reviewed. No pertinent surgical history.   Prior to Admission medications   Medication Sig Start Date End Date Taking? Authorizing Provider  albuterol (PROVENTIL HFA;VENTOLIN HFA) 108 (90 Base) MCG/ACT inhaler Inhale 1-2 puffs into the lungs every 4 (four) hours as needed for wheezing or shortness of breath. 12/31/16   Jimmy FootmanHernandez-Gonzalez, Andrea, MD  cloZAPine (CLOZARIL) 100 MG tablet Take 5 tablets (500 mg total) by mouth at bedtime. 12/03/16   Jimmy FootmanHernandez-Gonzalez, Andrea, MD   Fluticasone-Salmeterol (ADVAIR) 250-50 MCG/DOSE AEPB Inhale 1 puff into the lungs 2 (two) times daily.    [provider]  insulin detemir (LEVEMIR) 100 UNIT/ML injection Inject 14 Units into the skin every morning.    [provider]  ipratropium (ATROVENT) 0.06 % nasal spray Place 1 spray into both nostrils at bedtime. 09/14/16   Jimmy FootmanHernandez-Gonzalez, Andrea, MD  lisinopril (PRINIVIL,ZESTRIL) 2.5 MG tablet Take 2.5 mg by mouth daily.    [provider]  lithium carbonate (ESKALITH) 450 MG CR tablet Take 1 tablet (450 mg total) by mouth every 12 (twelve) hours. 09/14/16   Jimmy FootmanHernandez-Gonzalez, Andrea, MD  metformin (FORTAMET) 500 MG (OSM) 24 hr tablet Take 500 mg by mouth 2 (two) times daily with a meal.    [provider]  mometasone-formoterol (DULERA) 200-5 MCG/ACT AERO Inhale 2 puffs into the lungs 2 (two) times daily. 11/20/16   Altamese DillingVachhani, Vaibhavkumar, MD  omega-3 acid ethyl esters (LOVAZA) 1 g capsule Take 1 g by mouth daily.    [provider]  polyethylene glycol (MIRALAX / GLYCOLAX) packet Take 17 g by mouth daily. 09/15/16   Jimmy FootmanHernandez-Gonzalez, Andrea, MD  sertraline (ZOLOFT) 50 MG tablet Take 1 tablet (50 mg total) by mouth daily. 10/22/17   McNew, Ileene HutchinsonHolly R, MD  simvastatin (ZOCOR) 40 MG tablet Take 40 mg by mouth at bedtime.    [provider]  traZODone (DESYREL) 50 MG tablet Take 75 mg by mouth at bedtime.    [provider]  warfarin (COUMADIN) 10 MG tablet Take 10 mg by mouth daily.    [provider]     Allergies Penicillins   Family History  Problem Relation Age of Onset  . CAD Mother   . CAD Sister     Social History Social History   Tobacco Use  . Smoking status: Current Every Day Smoker    Packs/day: 1.00    Years: 23.00    Pack years: 23.00    Types: Cigarettes  . Smokeless tobacco: Never Used  . Tobacco comment: will provide material  Substance Use Topics  . Alcohol use: No    Comment:  occassionally  . Drug use: Yes    Types: Marijuana    Comment: Last use 11/29/16    Review of Systems  Constitutional:   No fever or chills.  Cardiovascular:   No chest pain or syncope. Respiratory:   No dyspnea or cough. Gastrointestinal:   Negative for abdominal pain, vomiting and diarrhea.  Musculoskeletal:   Negative for focal pain or swelling All other systems reviewed and are negative except as documented above in ROS and HPI.  ____________________________________________   PHYSICAL EXAM:  VITAL SIGNS: ED Triage Vitals [01/14/18 1715]  Enc Vitals Group     BP 107/69     Pulse Rate 100     Resp 16     Temp 98.7 F (37.1 C)     Temp Source Oral     SpO2 97 %     Weight 200 lb (90.7 kg)     Height 5\' 10"  (1.778 m)     Head Circumference      Peak Flow      Pain Score 0     Pain Loc      Pain Edu?      Excl. in GC?     Vital signs reviewed, nursing assessments reviewed.   Constitutional:   Alert and oriented. Non-toxic appearance. Eyes:   Conjunctivae are normal. EOMI.  ENT      Head:   Normocephalic and atraumatic.      Mouth/Throat:   MMM, no pharyngeal erythema. No peritonsillar mass.       Neck:   No meningismus. Full ROM. Hematological/Lymphatic/Immunilogical:   No cervical lymphadenopathy. Cardiovascular:   RRR. Symmetric bilateral radial and DP pulses.  No murmurs. Cap refill less than 2 seconds. Respiratory:   Normal respiratory effort without tachypnea/retractions. Breath sounds are clear and equal bilaterally. No wheezes/rales/rhonchi. Gastrointestinal:   Soft and nontender. Non distended. There is no CVA tenderness.  No rebound, rigidity, or guarding. Musculoskeletal:   Normal range of motion in all extremities. No joint effusions.  No lower extremity tenderness.  No edema. Neurologic:   Normal speech and language.  Motor grossly intact. No acute focal neurologic deficits are appreciated.  Skin:    Skin is warm, dry and intact. No rash noted.  No  petechiae, purpura, or bullae.  ____________________________________________    LABS (pertinent positives/negatives) (all labs ordered are listed, but only abnormal results are displayed) Labs Reviewed  COMPREHENSIVE METABOLIC PANEL - Abnormal; Notable for the following components:      Result Value   Glucose, Bld 128 (*)    BUN 24 (*)    Albumin 5.2 (*)    All other components within normal limits  ACETAMINOPHEN LEVEL - Abnormal; Notable for the following components:   Acetaminophen (Tylenol), Serum <10 (*)    All other components within normal limits  URINE DRUG SCREEN, QUALITATIVE ([facility] ONLY) - Abnormal; Notable for the following components:   Benzodiazepine, Ur Scrn TEST NOT PERFORMED, REAGENT NOT AVAILABLE (*)    All other components within normal  limits  PROTIME-INR - Abnormal; Notable for the following components:   Prothrombin Time 17.6 (*)    All other components within normal limits  GLUCOSE, CAPILLARY - Abnormal; Notable for the following components:   Glucose-Capillary 206 (*)    All other components within normal limits  ETHANOL  SALICYLATE LEVEL  CBC  DIFFERENTIAL   ____________________________________________   EKG    ____________________________________________    RADIOLOGY  No results found.  ____________________________________________   PROCEDURES Procedures  ____________________________________________    CLINICAL IMPRESSION / ASSESSMENT AND PLAN / ED COURSE  Pertinent labs & imaging results that were available during my care of the patient were reviewed by me and considered in my medical decision making (see chart for details).    Patient not in distress, no acute medical complaints, medically stable.  Vital signs unremarkable.  Labs unremarkable.  Recommendation from psychiatrist for inpatient psychiatric treatment.  He is medically clear to proceed with psych care at this time.       ____________________________________________   FINAL CLINICAL IMPRESSION(S) / ED DIAGNOSES    Final diagnoses:  Suicidal ideation     ED Discharge Orders    None      Portions of this note were generated with dragon dictation software. Dictation errors may occur despite best attempts at proofreading.    Sharman Cheek, MD 01/15/18 [phone]

## 2018-01-15 NOTE — Progress Notes (Signed)
Pt A & O X3. Denies SI, HI, AVH and pain when assessed "not right now". Pt has been compliant with medications and denies adverse drug reactions. Cooperative with CBG on approach. Speech is logical and soft. Vitals done, WNL and documented. Tolerates all PO intake well. [facility]. Report called to receiving nurse at 1408. Per TTS staff; pt's legal guardian was contacted by TTS and is in agreement with inpatient recommendation. Q 15 minutes safety checks maintained till time of d/c to [facility] without self harm gestures or outburst to note at this time.

## 2018-01-15 NOTE — Plan of Care (Signed)
New admission   Problem: Safety: Goal: Ability to disclose and discuss suicidal ideas will improve Outcome: Not Progressing Goal: Ability to identify and utilize support systems that promote safety will improve Outcome: Not Progressing   Problem: Self-Concept: Goal: Will verbalize positive feelings about self Outcome: Not Progressing Goal: Level of anxiety will decrease Outcome: Not Progressing   Problem: Coping: Goal: Coping ability will improve Outcome: Not Progressing Goal: Will verbalize feelings Outcome: Not Progressing   Problem: Education: Goal: Knowledge of [facility] Education information/materials will improve Outcome: Not Progressing   Problem: Health Behavior/Discharge Planning: Goal: Identification of resources available to assist in meeting health care needs will improve Outcome: Not Progressing Goal: Compliance with treatment plan for underlying cause of condition will improve Outcome: Not Progressing

## 2018-01-16 DIAGNOSIS — F203 Undifferentiated schizophrenia: Secondary | ICD-10-CM

## 2018-01-16 LAB — CBC
HEMATOCRIT: 43 % (ref 40.0–52.0)
Hemoglobin: 15.1 g/dL (ref 13.0–18.0)
MCH: 32.3 pg (ref 26.0–34.0)
MCHC: 35 g/dL (ref 32.0–36.0)
MCV: 92.1 fL (ref 80.0–100.0)
Platelets: 199 10*3/uL (ref 150–440)
RBC: 4.67 MIL/uL (ref 4.40–5.90)
RDW: 12.9 % (ref 11.5–14.5)
WBC: 4.7 10*3/uL (ref 3.8–10.6)

## 2018-01-16 LAB — HEMOGLOBIN A1C
Hgb A1c MFr Bld: 5.1 % (ref 4.8–5.6)
Mean Plasma Glucose: 99.67 mg/dL

## 2018-01-16 LAB — LIPID PANEL
CHOL/HDL RATIO: 5.5 ratio
Cholesterol: 166 mg/dL (ref 0–200)
HDL: 30 mg/dL — AB (ref >40)
LDL CALC: UNDETERMINED mg/dL (ref 0–99)
Triglycerides: 432 mg/dL — ABNORMAL HIGH (ref <150)
VLDL: UNDETERMINED mg/dL (ref 0–40)

## 2018-01-16 LAB — TSH: TSH: 1.277 u[IU]/mL (ref 0.350–4.500)

## 2018-01-16 LAB — PROTIME-INR
INR: 1.48
Prothrombin Time: 17.8 seconds — ABNORMAL HIGH (ref 11.4–15.2)

## 2018-01-16 NOTE — H&P (Signed)
Lance [name] is an 42 y.o. male.   Chief Complaint: "If I go back there is something bad might happen" HPI: Patient seen chart reviewed.  Patient is well-known to the psychiatric service.  Patient came in through the emergency room once more reporting suicidal and homicidal ideation with homicidal thoughts towards staff at his group home.  Today he was once again mostly focused on complaining about his group home.  He says that they treat him badly do not give him his money and that he cannot stand it anymore and will kill himself or someone else if he goes back there.  Patient is calm lucid blunted in his thinking however.  Minimal eye contact.  Hygiene not so good.  No physical complaints.  Social history: Patient has a guardian.  Resides in a group home.  He is here voluntarily.  Has little social activity outside of his home.  Medical history: Multiple medical problems including diabetes high blood pressure hyperlipidemia history of chronic anticoagulation  Substance abuse history: Past history of abuse of cannabis currently not active  Family history: Some family history of depression no psychosis that I know of.  Past Medical History:  Diagnosis Date  . Depression   . Diabetes mellitus without complication (HCC)   . Hyperlipidemia   . Hypertension   . Lupus anticoagulant disorder (HCC) 06/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  . PE (pulmonary thromboembolism) (HCC)   . Schizo affective schizophrenia (HCC)   . Supratherapeutic INR 11/19/2016    History reviewed. No pertinent surgical history.  Family History  Problem Relation Age of Onset  . CAD Mother   . CAD Sister    Social History:  reports that he has been smoking cigarettes.  He has a 23.00 pack-year smoking history. He has never used smokeless tobacco. He reports that he has current or past drug history. Drug: Marijuana. He reports that he does not drink alcohol.  Allergies:  Allergies  Allergen Reactions   . Penicillins Other (See Comments)    Reaction: "lockjaw" Has patient had a PCN reaction causing immediate rash, facial/tongue/throat swelling, SOB or lightheadedness with hypotension: No Has patient had a PCN reaction causing severe rash involving mucus membranes or skin necrosis: No Has patient had a PCN reaction that required hospitalization: No Has patient had a PCN reaction occurring within the last 10 years: No If all of the above answers are "NO", then may proceed with Cephalosporin use.     Medications Prior to Admission  Medication Sig Dispense Refill  . albuterol (PROVENTIL HFA;VENTOLIN HFA) 108 (90 Base) MCG/ACT inhaler Inhale 1-2 puffs into the lungs every 4 (four) hours as needed for wheezing or shortness of breath. 1 Inhaler 0  . cloZAPine (CLOZARIL) 100 MG tablet Take 5 tablets (500 mg total) by mouth at bedtime. 150 tablet 0  . fluticasone furoate-vilanterol (BREO ELLIPTA) 200-25 MCG/INH AEPB Inhale 1 puff into the lungs daily.    . Fluticasone-Salmeterol (ADVAIR) 250-50 MCG/DOSE AEPB Inhale 1 puff into the lungs 2 (two) times daily.    . insulin detemir (LEVEMIR) 100 UNIT/ML injection Inject 14 Units into the skin every morning.    Marland Kitchen ipratropium (ATROVENT) 0.06 % nasal spray Place 1 spray into both nostrils at bedtime. (Patient not taking: Reported on 01/15/2018) 3 mL 0  . lisinopril (PRINIVIL,ZESTRIL) 2.5 MG tablet Take 2.5 mg by mouth daily.    Marland Kitchen lithium carbonate (ESKALITH) 450 MG CR tablet Take 1 tablet (450 mg total) by  mouth every 12 (twelve) hours. 60 tablet 0  . metFORMIN (GLUCOPHAGE) 1000 MG tablet Take 1,000 mg by mouth 2 (two) times daily with a meal.    . mometasone-formoterol (DULERA) 200-5 MCG/ACT AERO Inhale 2 puffs into the lungs 2 (two) times daily. (Patient not taking: Reported on 01/15/2018) 1 Inhaler 1  . nicotine (NICODERM CQ - DOSED IN MG/24 HOURS) 21 mg/24hr patch Place 21 mg onto the skin daily.    . Omega-3 Fatty Acids (FISH OIL) 1000 MG CAPS Take 1,000  mg by mouth daily.    . polyethylene glycol (MIRALAX / GLYCOLAX) packet Take 17 g by mouth daily. 30 each 0  . sertraline (ZOLOFT) 50 MG tablet Take 1 tablet (50 mg total) by mouth daily. 30 tablet 0  . simvastatin (ZOCOR) 40 MG tablet Take 40 mg by mouth at bedtime.    . traZODone (DESYREL) 100 MG tablet Take 100 mg by mouth at bedtime.     Marland Kitchen. warfarin (COUMADIN) 5 MG tablet Take 5 mg by mouth daily.       Results for orders placed or performed during the hospital encounter of 01/15/18 (from the past 48 hour(s))  Lipid panel     Status: Abnormal   Collection Time: 01/16/18 10:41 AM  Result Value Ref Range   Cholesterol 166 0 - 200 mg/dL   Triglycerides 161432 (H) <150 mg/dL   HDL 30 (L) >09>40 mg/dL   Total CHOL/HDL Ratio 5.5 RATIO   VLDL UNABLE TO CALCULATE IF TRIGLYCERIDE OVER 400 mg/dL 0 - 40 mg/dL   LDL Cholesterol UNABLE TO CALCULATE IF TRIGLYCERIDE OVER 400 mg/dL 0 - 99 mg/dL    Comment:        Total Cholesterol/HDL:CHD Risk Coronary Heart Disease Risk Table                     Men   Women  1/2 Average Risk   3.4   3.3  Average Risk       5.0   4.4  2 X Average Risk   9.6   7.1  3 X Average Risk  23.4   11.0        Use the calculated Patient Ratio above and the CHD Risk Table to determine the patient's CHD Risk.        ATP III CLASSIFICATION (LDL):  <100     mg/dL   Optimal  [id number]  mg/dL   Near or Above                    Optimal  130-159  mg/dL   Borderline  [id number]  mg/dL   High  >478>190     mg/dL   Very High Performed at [facility], [address] [place], [place] [address]   TSH     Status: None   Collection Time: 01/16/18 10:41 AM  Result Value Ref Range   TSH 1.277 0.350 - 4.500 uIU/mL    Comment: Performed by a 3rd Generation assay with a functional sensitivity of <=0.01 uIU/mL. Performed at [facility], [address] [place], [place] [address]   Protime-INR     Status: Abnormal   Collection Time: 01/16/18 10:41 AM  Result Value Ref  Range   Prothrombin Time 17.8 (H) 11.4 - 15.2 seconds   INR 1.48     Comment: Performed at [facility], [address] [place], [place] [address]  CBC     Status: None  Collection Time: 01/16/18 10:41 AM  Result Value Ref Range   WBC 4.7 3.8 - 10.6 K/uL   RBC 4.67 4.40 - 5.90 MIL/uL   Hemoglobin 15.1 13.0 - 18.0 g/dL   HCT 16.1 09.6 - 04.5 %   MCV 92.1 80.0 - 100.0 fL   MCH 32.3 26.0 - 34.0 pg   MCHC 35.0 32.0 - 36.0 g/dL   RDW 40.9 81.1 - 91.4 %   Platelets 199 150 - 440 K/uL    Comment: Performed at [facility], [address] [place], [place] [address]   No results found.  Review of Systems  Constitutional: Negative.   HENT: Negative.   Eyes: Negative.   Respiratory: Negative.   Cardiovascular: Negative.   Gastrointestinal: Negative.   Musculoskeletal: Negative.   Skin: Negative.   Neurological: Negative.   Psychiatric/Behavioral: Positive for depression, hallucinations, memory loss and suicidal ideas. Negative for substance abuse. The patient is nervous/anxious. The patient does not have insomnia.     Blood pressure 115/74, pulse 83, temperature (!) 97.4 F (36.3 C), temperature source Oral, resp. rate 18, height 5\' 10"  (1.778 m), weight 89.4 kg (197 lb), SpO2 100 %. Physical Exam  Constitutional: He appears well-developed and well-nourished.  HENT:  Head: Normocephalic and atraumatic.  Eyes: Pupils are equal, round, and reactive to light. Conjunctivae are normal.  Neck: Normal range of motion.  Cardiovascular: Normal heart sounds.  Respiratory: Effort normal.  GI: Soft.  Musculoskeletal: Normal range of motion.  Neurological: He is alert.  Skin: Skin is warm and dry.  Psychiatric: His mood appears anxious. His speech is delayed. He is slowed. He expresses impulsivity and inappropriate judgment. He exhibits a depressed mood. He expresses homicidal and suicidal ideation. He expresses suicidal plans and homicidal plans. He exhibits abnormal  recent memory.     Assessment/Plan Patient admitted to the psychiatric service.  Continue 15-minute checks.  Continue outpatient medicine.  Monitor blood pressure monitor blood sugar.  Engage in groups and activities.  Supportive counseling.  Continually reassess suicidal and homicidal ideation.  Treatment team will meet and try and come up with any reasonable plan that may help him to stay more stable in the future.  Mordecai Rasmussen, MD 01/16/2018, 1:41 PM

## 2018-01-16 NOTE — Progress Notes (Addendum)
Patient ID: Manson AllanJames Paul Malecki, male   DOB: 04/15/1976, 42 y.o.   MRN: [id number]   CSW contacted pt's legal guardian Victory DakinVivian Harris at [phone] and obtained verbal consent to [facility].    Johnnye SimaAnnia Cuebas-Colon, LCSWA 01/16/2018 10:45AM

## 2018-01-16 NOTE — [facility] Group Notes (Signed)
[facility] Group Notes:  (Nursing/MHT/Case Management/Adjunct)  Date:  01/16/2018  Time:  9:56 PM  Type of Therapy:  Group Therapy  Participation Level:  Active  Participation Quality:  Appropriate  Affect:  Appropriate  Cognitive:  Appropriate  Insight:  Appropriate  Engagement in Group:  Engaged  Modes of Intervention:  Discussion  Summary of Progress/Problems: Lance [name] stated his goal was to feel better. Lance [name] stated he did not accomplish his goal. Lance [name] stated it was "so, so" MHT reviewed rules and expectations of unit. MHT informed patients of visitation hours and the number of visitors allowed on the unit at one time. MHT informed patients of the phone hours and the limitations of long-distance phone calls. MHT informed patients of the time vitals would be carried out in the morning. MHT encouraged good night sleep and informed calls would be made at [facility] for vitals. MHT informed patients of routine rounds and encouraged patients to cover appropriately. MHT informed patients of the resource board. MHT encouraged patients to link up with outpatient services. MHT explained the benefits of following through with treatment. MHT explained the importance of taking medications. MHT explained one of the most common mistakes about medications. MHT explained that as a diabetic, writer has to take medications daily or else blood sugar will rise uncontrollable. MHT provided an example of how blood sugar was low, then stop taking, and rose over 300. MHT explained when your doing well on medications, that means it's working and should continued to be taken. MHT encouraged patients to follow up with appointments and seek out treatment as needed. Jinger NeighborsKeith D Kavya Haag 01/16/2018, 9:56 PM

## 2018-01-16 NOTE — [facility] Suicide Risk Assessment (Signed)
[facility]   Nursing information obtained from:  Patient Demographic factors:  Male, Caucasian, Unemployed Current Mental Status:  Suicidal ideation indicated by patient, Suicide plan Loss Factors:  NA Historical Factors:  Prior suicide attempts Risk Reduction Factors:  Positive social support  Total Time spent with patient: 1 hour Principal Problem: <principal problem not specified> Diagnosis:   Patient Active Problem List   Diagnosis Date Noted  . Schizophrenia (HCC) [F20.9] 10/15/2017  . Schizophrenia, undifferentiated (HCC) [F20.3] 09/20/2017  . Tobacco use disorder [F17.200] 09/01/2016  . Schizoaffective disorder, bipolar type (HCC) [F25.0] 05/22/2016  . Diabetes mellitus without complication (HCC) [E11.9] 05/21/2016  . Hyperlipidemia [E78.5] 05/21/2016  . History of pulmonary embolism [Z86.711] 05/21/2016  . Chronic anticoagulation [Z79.01] 05/21/2016  . Hypertension [I10] 09/25/2015   Subjective Data: Patient admitted from home because of reports of suicidal and homicidal ideation.  Patient continues to report feeling that he would kill himself or someone else if he had to go back to his group home.  Most of his complaints and concerns today were about the group home and how they are not giving him enough money to buy cigarettes.  He says that his mood is depressed.  Continues to have intermittent hallucinations.  No specific new physical complaints.  Continued Clinical Symptoms:  Alcohol Use Disorder Identification Test Final Score (AUDIT): 0 The "Alcohol Use Disorders Identification Test", Guidelines for Use in Primary Care, Second Edition.  World Science writerHealth Organization [facility](WHO). Score between 0-7:  no or low risk or alcohol related problems. Score between 8-15:  moderate risk of alcohol related problems. Score between 16-19:  high risk of alcohol related problems. Score 20 or above:  warrants further diagnostic evaluation for alcohol dependence and  treatment.   CLINICAL FACTORS:   Schizophrenia:   Depressive state   Musculoskeletal: Strength & Muscle Tone: within normal limits Gait & Station: normal Patient leans: N/A  Psychiatric Specialty Exam: Physical Exam  ROS  Blood pressure 115/74, pulse 83, temperature (!) 97.4 F (36.3 C), temperature source Oral, resp. rate 18, height 5\' 10"  (1.778 m), weight 89.4 kg (197 lb), SpO2 100 %.Body mass index is 28.27 kg/m.  General Appearance: Disheveled  Eye Contact:  Minimal  Speech:  Slow  Volume:  Decreased  Mood:  Depressed  Affect:  Congruent  Thought Process:  Goal Directed  Orientation:  Full (Time, Place, and Person)  Thought Content:  Hallucinations: Auditory  Suicidal Thoughts:  Yes.  with intent/plan  Homicidal Thoughts:  Yes.  with intent/plan  Memory:  Immediate;   Fair Recent;   Fair Remote;   Fair  Judgement:  Poor  Insight:  Lacking  Psychomotor Activity:  Decreased  Concentration:  Concentration: Fair  Recall:  [employer]Fair  Fund of Knowledge:  Fair  Language:  Fair  Akathisia:  No  Handed:  Right  AIMS (if indicated):     Assets:  Desire for Improvement Housing  ADL's:  Intact  Cognition:  Impaired,  Mild  Sleep:  Number of Hours: 7.15      COGNITIVE FEATURES THAT CONTRIBUTE TO RISK:  Closed-mindedness and Polarized thinking    SUICIDE RISK:   Mild:  Suicidal ideation of limited frequency, intensity, duration, and specificity.  There are no identifiable plans, no associated intent, mild dysphoria and related symptoms, good self-control (both objective and subjective assessment), few other risk factors, and identifiable protective factors, including available and accessible social support.  PLAN OF CARE: Patient admitted to the psychiatric ward.  Continue  15-minute precautions.  Continue outpatient medication.  Engage patient in groups and activities.  He will meet with Child psychotherapist.  Changes in his suicidal and homicidal ideation will be reassessed in an  ongoing way prior to discharge.  I certify that inpatient services furnished can reasonably be expected to improve the patient's condition.   Mordecai Rasmussen, MD 01/16/2018, 1:39 PM

## 2018-01-16 NOTE — Plan of Care (Signed)
  Problem: Safety: Goal: Ability to disclose and discuss suicidal ideas will improve Outcome: Progressing  Patient denies suicidal thoughts.

## 2018-01-16 NOTE — Plan of Care (Addendum)
Patient found awake in bed upon my arrival. Patient is visible but not social this evening. Patient paces around the nursing station most of the evening. Stays on the periphery of common areas. Reports depression and passive SI with no plan or intent. Verbally contracts for safety while on the unit. Minimally verbal during interactions but polite and cooperative. Compliant with HS medication and staff direction. CBG 95. Patient chose to take Levemir. Ate large snack. Q 15 minute checks maintained. Will continue to monitor throughout the shift. Patient slept 7.5 hours. No apparent distress. Provided urine cup for UDS. Patient is resistant to compliance with am VS. Will endorse care to oncoming shift. CBG 188. No coverage necessary.  Problem: Safety: Goal: Ability to disclose and discuss suicidal ideas will improve Outcome: Progressing   Problem: Self-Concept: Goal: Level of anxiety will decrease Outcome: Progressing   Problem: Health Behavior/Discharge Planning: Goal: Compliance with treatment plan for underlying cause of condition will improve Outcome: Progressing   Problem: Education: Goal: Knowledge of General Education information will improve Description Including pain rating scale, medication(s)/side effects and non-pharmacologic comfort measures Outcome: Progressing

## 2018-01-16 NOTE — BHH Suicide Risk Assessment (Signed)
[facility] INPATIENT:  Family/Significant Other Suicide Prevention Education  Suicide Prevention Education:  Education Completed; Lance [name]/legal guardian, has been identified by the patient as the family member/significant other with whom the patient will be residing, and identified as the person(s) who will aid the patient in the event of a mental health crisis (suicidal ideations/suicide attempt).  With verbal consent from the legal guardian, the family member/significant other has been provided the following suicide prevention education, prior to the and/or following the discharge of the patient.  The suicide prevention education provided includes the following:  Suicide risk factors  Suicide prevention and interventions  National Suicide Hotline telephone number  [facility] assessment telephone number  [facility] Emergency Assistance 911  [facility] and/or Residential Mobile Crisis Unit telephone number  Request made of family/significant other to:  Remove weapons (e.g., guns, rifles, knives), all items previously/currently identified as safety concern.    Remove drugs/medications (over-the-counter, prescriptions, illicit drugs), all items previously/currently identified as a safety concern.  The family member/significant other verbalizes understanding of the suicide prevention education information provided.  The family member/significant other agrees to remove the items of safety concern listed above.  Lance Kalp  [name] 01/16/2018, 3:29 PM
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
## 2018-01-16 NOTE — B[facility] Group Notes (Signed)
LCSW Group Therapy Note 01/16/2018 1:15pm  Type of Therapy and Topic: Group Therapy: Feelings Around Returning Home & Establishing a Supportive Framework and Supporting Oneself When Supports Not Available  Participation Level: Did Not Attend  Description of Group:  Patients first processed thoughts and feelings about upcoming discharge. These included fears of upcoming changes, lack of change, new living environments, judgements and expectations from others and overall stigma of mental health issues. The group then discussed the definition of a supportive framework, what that looks and feels like, and how do to discern it from an unhealthy non-supportive network. The group identified different types of supports as well as what to do when your family/friends are less than helpful or unavailable  Therapeutic Goals  1. Patient will identify one healthy supportive network that they can use at discharge. 2. Patient will identify one factor of a supportive framework and how to tell it from an unhealthy network. 3. Patient able to identify one coping skill to use when they do not have positive supports from others. 4. Patient will demonstrate ability to communicate their needs through discussion and/or role plays.  Summary of Patient Progress:  Pt was invited to attend group but chose not to attend. CSW will continue to encourage pt to attend group throughout their admission.   Therapeutic Modalities Cognitive Behavioral Therapy Motivational Interviewing   Shuna Tabor  CUEBAS-COLON, LCSW 01/16/2018 12:34 PM 

## 2018-01-16 NOTE — Progress Notes (Signed)
D: Patient alert and oriented x 4, denies SI/HI/AVH, affect is flat but brightens upon approach. Patient is pleasant and cooperative, he appears less anxious but was noted pacing around the unit and was frequently redirected. Patient was also noted interacting with peers and staff appropriately. Patient's thoughts are organized and coherent no bizarre behavior noted.  A: Patient was offered support and encouragement, and was given scheduled medications. Patient was encouraged to attend groups. 15 minute checks were done for safety.  R:Patient attends groups and interacts appropriately with peers and staff. Pt is complaint with medication and  receptive to treatment . Safety maintained on unit.

## 2018-01-16 NOTE — Progress Notes (Signed)
ANTICOAGULATION CONSULT NOTE - Initial Consult  Pharmacy Consult for warfarin Indication: history of PE (2010)  Allergies  Allergen Reactions  . Penicillins Other (See Comments)    Reaction: "lockjaw" Has patient had a PCN reaction causing immediate rash, facial/tongue/throat swelling, SOB or lightheadedness with hypotension: No Has patient had a PCN reaction causing severe rash involving mucus membranes or skin necrosis: No Has patient had a PCN reaction that required hospitalization: No Has patient had a PCN reaction occurring within the last 10 years: No If all of the above answers are "NO", then may proceed with Cephalosporin use.     Patient Measurements: Height: 5\' 10"  (177.8 cm) Weight: 197 lb (89.4 kg) IBW/kg (Calculated) : 73 Heparin Dosing Weight:   Vital Signs:    Labs: Recent Labs    01/14/18 1720 01/14/18 2022 01/16/18 1041  HGB 17.3  --  15.1  HCT 49.4  --  43.0  PLT 275  --  199  LABPROT  --  17.6* 17.8*  INR  --  1.46 1.48  CREATININE 1.14  --   --     Estimated Creatinine Clearance: 94.1 mL/min (by C-G formula based on SCr of 1.14 mg/dL).   Medical History: Past Medical History:  Diagnosis Date  . Depression   . Diabetes mellitus without complication (HCC)   . Hyperlipidemia   . Hypertension   . Lupus anticoagulant disorder (HCC) 06/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  . PE (pulmonary thromboembolism) (HCC)   . Schizo affective schizophrenia (HCC)   . Supratherapeutic INR 11/19/2016    Medications:  Infusions:    Assessment: 42 yom cc SI with PMH DM, depression, HLD, HTN with acute psychiatric complaints. Pharmacy consulted to manage VKA while IP. Patient takes warfarin 10 mg po daily PTA  DATE INR DOSE  8/2 1.43 Missed 8/3 N/A 10 mg 8/4 1.48   Goal of Therapy:  INR 2-3 Monitor platelets by anticoagulation protocol: Yes   Plan:  Resume PTA warfarin 10 mg po daily. Will monitor INR daily until therapeutic x  2.  Carola FrostNathan A Jerimiah Wolman, Pharm.D., BCPS Clinical Pharmacist 01/16/2018,11:27 AM

## 2018-01-16 NOTE — Plan of Care (Signed)
Patient is alert and oriented. Positive for depression and has passive SI, and HI towards group home staff. Patient is disheveled; poor hygiene; refused early morning blood draw but blood was collected around 1030 am. Patient affect is flat and interaction is  minimal. Patient does attend groups. No self injurious behavior noted today nurse will continue to monitor. Problem: Safety: Goal: Ability to disclose and discuss suicidal ideas will improve 01/16/2018 1123 by Leamon ArntPowell, Zaydee Aina K, RN Outcome: Progressing 01/16/2018 1121 by Leamon ArntPowell, Ashvin Adelson K, RN Outcome: Progressing Goal: Ability to identify and utilize support systems that promote safety will improve 01/16/2018 1123 by Leamon ArntPowell, Braydon Kullman K, RN Outcome: Not Progressing 01/16/2018 1121 by Leamon ArntPowell, Tiny Chaudhary K, RN Outcome: Not Progressing   Problem: Self-Concept: Goal: Will verbalize positive feelings about self 01/16/2018 1123 by Leamon ArntPowell, Ruven Corradi K, RN Outcome: Progressing 01/16/2018 1121 by Leamon ArntPowell, Deidrick Rainey K, RN Outcome: Progressing Goal: Level of anxiety will decrease 01/16/2018 1123 by Leamon ArntPowell, Tait Balistreri K, RN Outcome: Not Progressing 01/16/2018 1121 by Leamon ArntPowell, Zayquan Bogard K, RN Outcome: Not Progressing   Problem: Coping: Goal: Coping ability will improve 01/16/2018 1123 by Leamon ArntPowell, Sheriden Archibeque K, RN Outcome: Progressing 01/16/2018 1121 by Leamon ArntPowell, Juhi Lagrange K, RN Outcome: Progressing Goal: Will verbalize feelings 01/16/2018 1123 by Leamon ArntPowell, Garion Wempe K, RN Outcome: Not Progressing 01/16/2018 1121 by Leamon ArntPowell, Letonya Mangels K, RN Outcome: Not Progressing

## 2018-01-17 DIAGNOSIS — F259 Schizoaffective disorder, unspecified: Secondary | ICD-10-CM

## 2018-01-17 LAB — LITHIUM LEVEL: Lithium Lvl: 0.6 mmol/L (ref 0.60–1.20)

## 2018-01-17 LAB — GLUCOSE, CAPILLARY
GLUCOSE-CAPILLARY: 98 mg/dL (ref 70–99)
GLUCOSE-CAPILLARY: 90 mg/dL (ref 70–99)
Glucose-Capillary: 116 mg/dL — ABNORMAL HIGH (ref 70–99)
Glucose-Capillary: 123 mg/dL — ABNORMAL HIGH (ref 70–99)
Glucose-Capillary: 109 mg/dL — ABNORMAL HIGH (ref 70–99)
Glucose-Capillary: 95 mg/dL (ref 70–99)
Glucose-Capillary: 118 mg/dL — ABNORMAL HIGH (ref 70–99)
Glucose-Capillary: 184 mg/dL — ABNORMAL HIGH (ref 70–99)
Glucose-Capillary: 97 mg/dL (ref 70–99)
Glucose-Capillary: 104 mg/dL — ABNORMAL HIGH (ref 70–99)

## 2018-01-17 LAB — PROTIME-INR
INR: 1.87
PROTHROMBIN TIME: 21.4 s — AB (ref 11.4–15.2)

## 2018-01-17 NOTE — Progress Notes (Signed)
Patient ID: Lance [name], male   DOB: 22-Jan-1976, 42 y.o.   MRN: [id number]  CSW spoke with Flint MelterSharon Bruce, the director of pt's group home,  at [phone] and confirmed pt is able to return upon discharge. CSW will continue to coordinate with pt's group home as needed for updates/discharge planning.   Heidi DachKelsey Sevyn Paredez, MSW, LCSW Clinical Social Worker 01/17/2018 3:16 PM

## 2018-01-17 NOTE — Progress Notes (Signed)
CSW contacted [facility]Janice [facility] to alert her pt was admitted and obtain collateral information. Liborio NixonJanice did not answer, but texted CSW and stated she would call CSW later today. CSW will follow up as needed.   Heidi DachKelsey Ghadeer Kastelic, MSW, LCSW Clinical Social Worker 01/17/2018 10:52 AM

## 2018-01-17 NOTE — Tx Team (Addendum)
Interdisciplinary Treatment and Diagnostic Plan Update  01/17/2018 Time of Session: [name] MRN: [id number]  Principal Diagnosis: <principal problem not specified>  Secondary Diagnoses: Active Problems:   Schizophrenia (HCC)   Current Medications:  Current Facility-Administered Medications  Medication Dose Route Frequency Provider Last Rate Last Dose  . acetaminophen (TYLENOL) tablet 650 mg  650 mg Oral Q6H PRN Clapacs, [name], MD      . albuterol (PROVENTIL HFA;VENTOLIN HFA) 108 (90 Base) MCG/ACT inhaler 2 puff  2 puff Inhalation Q6H PRN Clapacs, [name], MD      . alum & mag hydroxide-simeth (MAALOX/MYLANTA) 200-200-20 MG/5ML suspension 30 mL  30 mL Oral Q4H PRN Clapacs, [name], MD      . cloZAPine (CLOZARIL) tablet 500 mg  500 mg Oral QHS Clapacs, [name], MD   500 mg at 01/16/18 2121  . insulin aspart (novoLOG) injection 0-15 Units  0-15 Units Subcutaneous TID WC Clapacs, Jackquline DenmarkJohn T, MD   2 Units at 01/16/18 1148  . insulin detemir (LEVEMIR) injection 14 Units  14 Units Subcutaneous QHS Haskell RilingMcNew, Holly R, MD   14 Units at 01/16/18 2121  . lisinopril (PRINIVIL,ZESTRIL) tablet 2.5 mg  2.5 mg Oral Daily Clapacs, [name], MD   2.5 mg at 01/16/18 1149  . lithium carbonate capsule 450 mg  450 mg Oral BID WC Clapacs, Jackquline DenmarkJohn T, MD   450 mg at 01/16/18 1653  . magnesium hydroxide (MILK OF MAGNESIA) suspension 30 mL  30 mL Oral Daily PRN Clapacs, [name], MD      . metFORMIN (GLUCOPHAGE) tablet 500 mg  500 mg Oral BID WC Clapacs, Jackquline DenmarkJohn T, MD   500 mg at 01/16/18 1653  . mometasone-formoterol (DULERA) 200-5 MCG/ACT inhaler 2 puff  2 puff Inhalation BID Clapacs, Jackquline DenmarkJohn T, MD   2 puff at 01/16/18 1950  . polyethylene glycol (MIRALAX / GLYCOLAX) packet 17 g  17 g Oral Daily Clapacs, Jackquline DenmarkJohn T, MD   17 g at 01/15/18 2000  . sertraline (ZOLOFT) tablet 50 mg  50 mg Oral Daily Clapacs, [name], MD   50 mg at 01/16/18 1150  . simvastatin (ZOCOR) tablet 40 mg  40 mg Oral q1800 Clapacs, Jackquline DenmarkJohn T, MD   40 mg at  01/16/18 1653  . traZODone (DESYREL) tablet 100 mg  100 mg Oral QHS Clapacs, Jackquline DenmarkJohn T, MD   100 mg at 01/16/18 2121  . warfarin (COUMADIN) tablet 10 mg  10 mg Oral q1800 McNew, Ileene HutchinsonHolly R, MD   10 mg at 01/16/18 1653  . Warfarin - Pharmacist Dosing Inpatient   Does not apply q1800 McNew, Ileene HutchinsonHolly R, MD       PTA Medications: Medications Prior to Admission  Medication Sig Dispense Refill Last Dose  . albuterol (PROVENTIL HFA;VENTOLIN HFA) 108 (90 Base) MCG/ACT inhaler Inhale 1-2 puffs into the lungs every 4 (four) hours as needed for wheezing or shortness of breath. 1 Inhaler 0 prn at prn  . cloZAPine (CLOZARIL) 100 MG tablet Take 5 tablets (500 mg total) by mouth at bedtime. 150 tablet 0 01/13/2018 at 2000  . fluticasone furoate-vilanterol (BREO ELLIPTA) 200-25 MCG/INH AEPB Inhale 1 puff into the lungs daily.   01/13/2018 at 0800  . Fluticasone-Salmeterol (ADVAIR) 250-50 MCG/DOSE AEPB Inhale 1 puff into the lungs 2 (two) times daily.   01/14/2018 at 2000  . insulin detemir (LEVEMIR) 100 UNIT/ML injection Inject 14 Units into the skin every morning.   01/14/2018 at 0800  . ipratropium (ATROVENT) 0.06 %  nasal spray Place 1 spray into both nostrils at bedtime. (Patient not taking: Reported on 01/15/2018) 3 mL 0 Not Taking at Unknown time  . lisinopril (PRINIVIL,ZESTRIL) 2.5 MG tablet Take 2.5 mg by mouth daily.   01/13/2018 at 0800  . lithium carbonate (ESKALITH) 450 MG CR tablet Take 1 tablet (450 mg total) by mouth every 12 (twelve) hours. 60 tablet 0 01/13/2018 at 2000  . metFORMIN (GLUCOPHAGE) 1000 MG tablet Take 1,000 mg by mouth 2 (two) times daily with a meal.   01/13/2018 at 2000  . mometasone-formoterol (DULERA) 200-5 MCG/ACT AERO Inhale 2 puffs into the lungs 2 (two) times daily. (Patient not taking: Reported on 01/15/2018) 1 Inhaler 1 Not Taking at Unknown time  . nicotine (NICODERM CQ - DOSED IN MG/24 HOURS) 21 mg/24hr patch Place 21 mg onto the skin daily.   Past Week at Unknown time  . Omega-3 Fatty Acids (FISH  OIL) 1000 MG CAPS Take 1,000 mg by mouth daily.   Past Week at Unknown time  . polyethylene glycol (MIRALAX / GLYCOLAX) packet Take 17 g by mouth daily. 30 each 0 prn at prn  . sertraline (ZOLOFT) 50 MG tablet Take 1 tablet (50 mg total) by mouth daily. 30 tablet 0 01/13/2018 at 0800  . simvastatin (ZOCOR) 40 MG tablet Take 40 mg by mouth at bedtime.   01/13/2018 at 2000  . traZODone (DESYREL) 100 MG tablet Take 100 mg by mouth at bedtime.    01/13/2018 at 2000  . warfarin (COUMADIN) 5 MG tablet Take 5 mg by mouth daily.    01/13/2018 at 1700    Patient Stressors: Educational concerns Financial difficulties Health problems  Patient Strengths: Ability for Warden/ranger for treatment/growth  Treatment Modalities: Medication Management, Group therapy, Case management,  1 to 1 session with clinician, Psychoeducation, Recreational therapy.   Physician Treatment Plan for Primary Diagnosis: <principal problem not specified> Long Term Goal(s):     Short Term Goals:    Medication Management: Evaluate patient's response, side effects, and tolerance of medication regimen.  Therapeutic Interventions: 1 to 1 sessions, Unit Group sessions and Medication administration.  Evaluation of Outcomes: Progressing  Physician Treatment Plan for Secondary Diagnosis: Active Problems:   Schizophrenia (HCC)  Long Term Goal(s):     Short Term Goals:       Medication Management: Evaluate patient's response, side effects, and tolerance of medication regimen.  Therapeutic Interventions: 1 to 1 sessions, Unit Group sessions and Medication administration.  Evaluation of Outcomes: Progressing   RN Treatment Plan for Primary Diagnosis: <principal problem not specified> Long Term Goal(s): Knowledge of disease and therapeutic regimen to maintain health will improve  Short Term Goals: Ability to verbalize feelings will improve, Ability to identify and develop effective coping behaviors  will improve and Compliance with prescribed medications will improve  Medication Management: RN will administer medications as ordered by provider, will assess and evaluate patient's response and provide education to patient for prescribed medication. RN will report any adverse and/or side effects to prescribing provider.  Therapeutic Interventions: 1 on 1 counseling sessions, Psychoeducation, Medication administration, Evaluate responses to treatment, Monitor vital signs and CBGs as ordered, Perform/monitor CIWA, COWS, AIMS and Fall Risk screenings as ordered, Perform wound care treatments as ordered.  Evaluation of Outcomes: Progressing   LCSW Treatment Plan for Primary Diagnosis: <principal problem not specified> Long Term Goal(s): Safe transition to appropriate next level of care at discharge, Engage patient in therapeutic group addressing interpersonal concerns.  Short Term  Goals: Engage patient in aftercare planning with referrals and resources, Increase emotional regulation and Increase skills for wellness and recovery  Therapeutic Interventions: Assess for all discharge needs, 1 to 1 time with Social worker, Explore available resources and support systems, Assess for adequacy in community support network, Educate family and significant other(s) on suicide prevention, Complete Psychosocial Assessment, Interpersonal group therapy.  Evaluation of Outcomes: Progressing   Progress in Treatment: Attending groups: Yes. Participating in groups: Yes. Taking medication as prescribed: Yes. Toleration medication: Yes. Family/Significant other contact made: Yes, individual(s) contacted:  pt's guardian Patient understands diagnosis: Yes. Discussing patient identified problems/goals with staff: Yes. Medical problems stabilized or resolved: Yes. Denies suicidal/homicidal ideation: Yes. Issues/concerns per patient self-inventory: No. Other: None at this time.   New problem(s) identified: No,  Describe:  None at this time.  New Short Term/Long Term Goal(s): Pt will report 0 SI or HI for at least 48 consecutive hours prior to discharge.   Patient Goals:  Pt reports his goal for treatment is, "to feel better. My life is not getting better, and the way it is right now--it's just not worth living."   Discharge Plan or Barriers: Pt will continue tx with [facility] and will return to group home upon discharge.   Reason for Continuation of Hospitalization: Depression Medication stabilization  Estimated Length of Stay: 3-5 days  Recreational Therapy: Patient Stressors: Group home will not give me any money or take me any where Patient Goal: Patient will identify 3 positive coping skills to decrease depressive symptoms within 5 recreation therapy group sessions  Attendees: Patient: Lance [name] 01/17/2018 10:52 AM  Physician: Dr. Johnella Moloney, MD 01/17/2018 10:52 AM  Nursing: Milas Hock, RN 01/17/2018 10:52 AM  RN Care Manager: 01/17/2018 10:52 AM  Social Worker: Heidi Dach, LCSW 01/17/2018 10:52 AM  Recreational Therapist: Garret Reddish, CTRS-LRT 01/17/2018 10:52 AM  Other: Jake Shark, LCSW 01/17/2018 10:52 AM  Other: Damian Leavell, Cha[place]in  01/17/2018 10:52 AM  Other: 01/17/2018 10:52 AM    Scribe for Treatment Team: Heidi Dach, LCSW 01/17/2018 10:52 AM

## 2018-01-17 NOTE — [facility] Group Notes (Signed)
2 2[facility] Group Notes:  (Nursing/MHT/Case Management/Adjunct)  Date:  01/17/2018  Time:  11:45 PM  Type of Therapy:  Group Therapy 2  2 Participation Level:  Active  Participation Quality:  Appropriate  Affect:  Appropriate  Cognitive:  Appropriate  Insight:  Appropriate  Engagement in Group:  Engaged  Modes of Intervention:  Activity         Summary of Progress/Problems:  Lance EkJanice Marie Bryah [name] 01/17/2018, 11:45 PM

## 2018-01-17 NOTE — Plan of Care (Signed)
Patient is alert and oriented x 4, denies SI, HI and AVH. Patient is compliant with medications, and appropriate on the unit with staff and peers. Affect is flat and assertive. Patient does pace the hallways but denies anxiety. No self injurious behavior noted. Patient has no complaints at this time. Nurse will continue to monitor.

## 2018-01-17 NOTE — Progress Notes (Signed)
Recreation Therapy Notes  INPATIENT RECREATION THERAPY ASSESSMENT  Patient Details Name: Lance [name] MRN: [id number] DOB: 1975/10/07 Today's Date: 01/17/2018       Information Obtained From: Patient  Able to Participate in Assessment/Interview: Yes  Patient Presentation: Responsive  Reason for Admission (Per Patient): Suicidal Ideation, Active Symptoms  Patient Stressors: Other (Comment)(Group home will not give me any money or take me any where)  Coping Skills:   Other (Comment)(Smoke; walk)  Leisure Interests (2+):  (Nothing)  Frequency of Recreation/Participation:    Awareness of Community Resources:     [employer]Community Resources:     Current Use:    If no, Barriers?:    Expressed Interest in [employer] CorporationCommunity Resource Information:    [place]  Patient Main Form of Transportation:    Patient Strengths:  Polite  Patient Identified Areas of Improvement:  Try and make some money; get a job  Patient Goal for Hospitalization:  I want to feel better  Current SI (including self-harm):  No  Current HI:  No  Current AVH: No  Staff Intervention Plan: Group Attendance, Collaborate with Interdisciplinary Treatment Team  Consent to Intern Participation: N/A  Lance [name] 01/17/2018, 3:21 PM

## 2018-01-17 NOTE — Progress Notes (Signed)
[facility]  01/17/2018 3:11 PM Lance [name]  MRN:  [id number] Subjective:  Pt is perseverative on not liking his group home. He was moved to a new group home recently. He states that he is bored there and there is nothing to do. He is also upset that they do not give him any money. HE [facility]. He states that he is "scared I will hurt someone or myself because I don't want to go to the group home.' HE denies any plan or intent of harming others. He frequently has this complaint when he does not like his group home. He is not violent and has never attempted to harm others. He states, "I don't want to go to jail thought."  He denies SI today. He states, " I just have negative thoughts about the group home because they don't let me get ccigarettes" He is overall organized in conversation. He knows all of his medications and reports compliance with all of them.   Principal Problem: <principal problem not specified> Diagnosis:   Patient Active Problem List   Diagnosis Date Noted  . Schizophrenia (HCC) [F20.9] 10/15/2017    Priority: High  . Schizophrenia, undifferentiated (HCC) [F20.3] 09/20/2017  . Tobacco use disorder [F17.200] 09/01/2016  . Schizoaffective disorder, bipolar type (HCC) [F25.0] 05/22/2016  . Diabetes mellitus without complication (HCC) [E11.9] 05/21/2016  . Hyperlipidemia [E78.5] 05/21/2016  . History of pulmonary embolism [Z86.711] 05/21/2016  . Chronic anticoagulation [Z79.01] 05/21/2016  . Hypertension [I10] 09/25/2015   Total Time spent with patient: 20 minutes  Past Psychiatric History: See H&P  Past Medical History:  Past Medical History:  Diagnosis Date  . Depression   . Diabetes mellitus without complication (HCC)   . Hyperlipidemia   . Hypertension   . Lupus anticoagulant disorder (HCC) 06/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  . PE (pulmonary thromboembolism) (HCC)   . Schizo  affective schizophrenia (HCC)   . Supratherapeutic INR 11/19/2016   History reviewed. No pertinent surgical history. Family History:  Family History  Problem Relation Age of Onset  . CAD Mother   . CAD Sister    Family Psychiatric  History: See h&P Social History:  Social History   Substance and Sexual Activity  Alcohol Use No   Comment: occassionally     Social History   Substance and Sexual Activity  Drug Use Yes  . Types: Marijuana   Comment: Last use 11/29/16    Social History   Socioeconomic History  . Marital status: Single    Spouse name: Not on file  . Number of children: Not on file  . Years of education: Not on file  . Highest education level: Not on file  Occupational History  . Not on file  Social Needs  . Financial resource strain: Not on file  . Food insecurity:    Worry: Not on file    Inability: Not on file  . Transportation needs:    Medical: Not on file    Non-medical: Not on file  Tobacco Use  . Smoking status: Current Every Day Smoker    Packs/day: 1.00    Years: 23.00    Pack years: 23.00    Types: Cigarettes  . Smokeless tobacco: Never Used  . Tobacco comment: will provide material  Substance and Sexual Activity  . Alcohol use: No    Comment: occassionally  . Drug use: Yes  Types: Marijuana    Comment: Last use 11/29/16  . Sexual activity: Never    Birth control/protection: None    Comment: occasional marijuana- none recently  Lifestyle  . Physical activity:    Days per week: Not on file    Minutes per session: Not on file  . Stress: Not on file  Relationships  . Social connections:    Talks on phone: Not on file    Gets together: Not on file    Attends religious service: Not on file    Active member of club or organization: Not on file    Attends meetings of clubs or organizations: Not on file    Relationship status: Not on file  Other Topics Concern  . Not on file  Social History Narrative   From a group home in  [place]   Additional Social History:                         Sleep: Fair  Appetite:  Fair  Current Medications: Current Facility-Administered Medications  Medication Dose Route Frequency Provider Last Rate Last Dose  . acetaminophen (TYLENOL) tablet 650 mg  650 mg Oral Q6H PRN Clapacs, [name], MD      . albuterol (PROVENTIL HFA;VENTOLIN HFA) 108 (90 Base) MCG/ACT inhaler 2 puff  2 puff Inhalation Q6H PRN Clapacs, [name], MD      . alum & mag hydroxide-simeth (MAALOX/MYLANTA) 200-200-20 MG/5ML suspension 30 mL  30 mL Oral Q4H PRN Clapacs, [name], MD      . cloZAPine (CLOZARIL) tablet 500 mg  500 mg Oral QHS Clapacs, [name], MD   500 mg at 01/16/18 2121  . insulin aspart (novoLOG) injection 0-15 Units  0-15 Units Subcutaneous TID WC Clapacs, [name], MD   3 Units at 01/17/18 1144  . insulin detemir (LEVEMIR) injection 14 Units  14 Units Subcutaneous QHS Haskell Riling, MD   14 Units at 01/16/18 2121  . lisinopril (PRINIVIL,ZESTRIL) tablet 2.5 mg  2.5 mg Oral Daily Clapacs, [name], MD   2.5 mg at 01/17/18 1145  . lithium carbonate capsule 450 mg  450 mg Oral BID WC Clapacs, [name], MD   450 mg at 01/17/18 1145  . magnesium hydroxide (MILK OF MAGNESIA) suspension 30 mL  30 mL Oral Daily PRN Clapacs, [name], MD      . metFORMIN (GLUCOPHAGE) tablet 500 mg  500 mg Oral BID WC Clapacs, [name], MD   500 mg at 01/17/18 1144  . mometasone-formoterol (DULERA) 200-5 MCG/ACT inhaler 2 puff  2 puff Inhalation BID Clapacs, [name], MD   2 puff at 01/17/18 1146  . polyethylene glycol (MIRALAX / GLYCOLAX) packet 17 g  17 g Oral Daily Clapacs, [name], MD   17 g at 01/15/18 2000  . sertraline (ZOLOFT) tablet 50 mg  50 mg Oral Daily Clapacs, [name], MD   50 mg at 01/17/18 1144  . simvastatin (ZOCOR) tablet 40 mg  40 mg Oral q1800 Clapacs, [name], MD   40 mg at 01/16/18 1653  . traZODone (DESYREL) tablet 100 mg  100 mg Oral QHS Clapacs, [name], MD   100 mg at 01/16/18 2121  . warfarin (COUMADIN)  tablet 10 mg  10 mg Oral q1800 Rilyn Scroggs, Ileene Hutchinson, MD   10 mg at 01/16/18 1653  . Warfarin - Pharmacist Dosing Inpatient   Does not apply q1800 Evelette Hollern, Ileene Hutchinson, MD  Lab Results:  Results for orders placed or performed during the hospital encounter of 01/15/18 (from the past 48 hour(s))  Glucose, capillary     Status: None   Collection Time: 01/15/18  4:36 PM  Result Value Ref Range   Glucose-Capillary 98 70 - 99 mg/dL   Comment 1 Notify RN    Comment 2 Document in Chart   Glucose, capillary     Status: None   Collection Time: 01/15/18  8:51 PM  Result Value Ref Range   Glucose-Capillary 90 70 - 99 mg/dL   Comment 1 Notify RN   Glucose, capillary     Status: Abnormal   Collection Time: 01/16/18  7:15 AM  Result Value Ref Range   Glucose-Capillary 116 (H) 70 - 99 mg/dL   Comment 1 Notify RN   Hemoglobin A1c     Status: None   Collection Time: 01/16/18 10:41 AM  Result Value Ref Range   Hgb A1c MFr Bld 5.1 4.8 - 5.6 %    Comment: (NOTE) Pre diabetes:          5.7%-6.4% Diabetes:              >6.4% Glycemic control for   <7.0% adults with diabetes    Mean Plasma Glucose 99.67 mg/dL    Comment: Performed at [facility], [address] [place], [place] [address]  Lipid panel     Status: Abnormal   Collection Time: 01/16/18 10:41 AM  Result Value Ref Range   Cholesterol 166 0 - 200 mg/dL   Triglycerides 604 (H) <150 mg/dL   HDL 30 (L) >54 mg/dL   Total CHOL/HDL Ratio 5.5 RATIO   VLDL UNABLE TO CALCULATE IF TRIGLYCERIDE OVER 400 mg/dL 0 - 40 mg/dL   LDL Cholesterol UNABLE TO CALCULATE IF TRIGLYCERIDE OVER 400 mg/dL 0 - 99 mg/dL    Comment:        Total Cholesterol/HDL:CHD Risk Coronary Heart Disease Risk Table                     Men   Women  1/2 Average Risk   3.4   3.3  Average Risk       5.0   4.4  2 X Average Risk   9.6   7.1  3 X Average Risk  23.4   11.0        Use the calculated Patient Ratio above and the CHD Risk Table to determine the patient's CHD  Risk.        ATP III CLASSIFICATION (LDL):  <100     mg/dL   Optimal  098-119  mg/dL   Near or Above                    Optimal  130-159  mg/dL   Borderline  147-829  mg/dL   High  >562     mg/dL   Very High Performed at [facility], [address] [place], [place] [address]   TSH     Status: None   Collection Time: 01/16/18 10:41 AM  Result Value Ref Range   TSH 1.277 0.350 - 4.500 uIU/mL    Comment: Performed by a 3rd Generation assay with a functional sensitivity of <=0.01 uIU/mL. Performed at [facility], [address] [place] [place], [place] [address]   Protime-INR     Status: Abnormal   Collection Time: 01/16/18 10:41 AM  Result Value Ref Range   Prothrombin  Time 17.8 (H) 11.4 - 15.2 seconds   INR 1.48     Comment: Performed at [facility], [address] [place], [place] [address]  CBC     Status: None   Collection Time: 01/16/18 10:41 AM  Result Value Ref Range   WBC 4.7 3.8 - 10.6 K/uL   RBC 4.67 4.40 - 5.90 MIL/uL   Hemoglobin 15.1 13.0 - 18.0 g/dL   HCT 60.4 54.0 - 98.1 %   MCV 92.1 80.0 - 100.0 fL   MCH 32.3 26.0 - 34.0 pg   MCHC 35.0 32.0 - 36.0 g/dL   RDW 19.1 47.8 - 29.5 %   Platelets 199 150 - 440 K/uL    Comment: Performed at [facility], [address] [place], [place] [address]  Lithium level     Status: None   Collection Time: 01/16/18 10:45 AM  Result Value Ref Range   Lithium Lvl 0.60 0.60 - 1.20 mmol/L    Comment: Performed at [facility], [address] [place], [place] [address]  Glucose, capillary     Status: Abnormal   Collection Time: 01/16/18 11:34 AM  Result Value Ref Range   Glucose-Capillary 123 (H) 70 - 99 mg/dL  Glucose, capillary     Status: Abnormal   Collection Time: 01/16/18  4:08 PM  Result Value Ref Range   Glucose-Capillary 109 (H) 70 - 99 mg/dL  Glucose, capillary     Status: None   Collection Time: 01/16/18  8:05 PM  Result Value Ref Range   Glucose-Capillary 95 70 -  99 mg/dL  Glucose, capillary     Status: Abnormal   Collection Time: 01/17/18  6:44 AM  Result Value Ref Range   Glucose-Capillary 118 (H) 70 - 99 mg/dL  Protime-INR     Status: Abnormal   Collection Time: 01/17/18  9:00 AM  Result Value Ref Range   Prothrombin Time 21.4 (H) 11.4 - 15.2 seconds   INR 1.87     Comment: Performed at [facility], [address] [place], [place] [address]  Glucose, capillary     Status: Abnormal   Collection Time: 01/17/18 11:05 AM  Result Value Ref Range   Glucose-Capillary 184 (H) 70 - 99 mg/dL    Blood Alcohol level:  Lab Results  Component Value Date   ETH <10 01/14/2018   ETH <10 10/13/2017    Metabolic Disorder Labs: Lab Results  Component Value Date   HGBA1C 5.1 01/16/2018   MPG 99.67 01/16/2018   MPG 88.19 09/21/2017   Lab Results  Component Value Date   PROLACTIN 2.0 (L) 08/31/2016   PROLACTIN 4.8 05/22/2016   Lab Results  Component Value Date   CHOL 166 01/16/2018   TRIG 432 (H) 01/16/2018   HDL 30 (L) 01/16/2018   CHOLHDL 5.5 01/16/2018   VLDL UNABLE TO CALCULATE IF TRIGLYCERIDE OVER 400 mg/dL 95/28/4132   LDLCALC UNABLE TO CALCULATE IF TRIGLYCERIDE OVER 400 mg/dL 44/06/270   LDLCALC 37 09/22/2017    Physical Findings: AIMS:  , ,  ,  ,    CIWA:    COWS:     Musculoskeletal: Strength & Muscle Tone: within normal limits Gait & Station: normal Patient leans: N/A  Psychiatric Specialty Exam: Physical Exam  Nursing note and vitals reviewed.   Review of Systems  All other systems reviewed and are negative.   Blood pressure 126/78, pulse 83, temperature (!) 97.4 F (36.3 C), temperature source Oral, resp. rate 18, height  5\' 10"  (1.778 m), weight 89.4 kg (197 lb), SpO2 100 %.Body mass index is 28.27 kg/m.  General Appearance: Casual  Eye Contact:  Minimal  Speech:  Slow  Volume:  Normal  Mood:  Depressed  Affect:  Congruent  Thought Process:  Coherent and Goal Directed  Orientation:  Full (Time,  Place, and Person)  Thought Content:  Perseverative on group home  Suicidal Thoughts:  No  Homicidal Thoughts:  Yes.  without intent/plan  Memory:  Immediate;   Fair  Judgement:  Fair  Insight:  Fair  Psychomotor Activity:  Normal  Concentration:  Concentration: Fair  Recall:  [employer] of Knowledge:  Fair  Language:  Fair  Akathisia:  No      Assets:  Resilience  ADL's:  Intact  Cognition:  WNL  Sleep:  Number of Hours: 7.5     Treatment Plan Summary: 42 yo male admitted due to SI and HI. He is known to this unit and this provider. He has chronic dissatisfaction with group homes. He was moved to a new one recently but is still unhappy there. He frequently voices HI and SI solely related to not liking his group homes. These thoughts are very ego dystonic and he states that he does not want to go to jail.  He has not acted on any of these thoughts and are usually very vague with no plan or intent. He has not acted out at all on the unit. Psychotic symptoms seem to be under adequate control. He is organized in conversation. He does not have much of a routine which is an issue for him.   Schizoaffective disorder -Continue Clozapine 500 mg daily -Continue Lithium 450 mg BID. Lithium level 0.60 -Continue Zoloft 50 mg daily  HTN -Stable -Continue Lisinopril 2.5 mg daily  H/O PE -Checking INR daily, pharmacy dosing Coumadin  DM -Levemir 14 units qhs -Metformin 500 mg BID  Dispo -Pt has guardian and ACT team. Will need to find out if he can return to his group home    Haskell Riling, MD 01/17/2018, 3:11 PM

## 2018-01-17 NOTE — Progress Notes (Signed)
Recreation Therapy Notes   Date: 01/17/2018  Time: 9:30 pm   Location: Craft Room   Behavioral response: N/A   Intervention Topic: Coping Skills  Discussion/Intervention: Patient did not attend group.   Clinical Observations/Feedback:  Patient did not attend group.   Quantez Schnyder LRT/CTRS         Dorothea Yow 01/17/2018 10:14 AM 

## 2018-01-17 NOTE — Progress Notes (Signed)
ANTICOAGULATION CONSULT NOTE - Initial Consult  Pharmacy Consult for warfarin Indication: history of PE (2010)  Allergies  Allergen Reactions  . Penicillins Other (See Comments)    Reaction: "lockjaw" Has patient had a PCN reaction causing immediate rash, facial/tongue/throat swelling, SOB or lightheadedness with hypotension: No Has patient had a PCN reaction causing severe rash involving mucus membranes or skin necrosis: No Has patient had a PCN reaction that required hospitalization: No Has patient had a PCN reaction occurring within the last 10 years: No If all of the above answers are "NO", then may proceed with Cephalosporin use.     Patient Measurements: Height: 5\' 10"  (177.8 cm) Weight: 197 lb (89.4 kg) IBW/kg (Calculated) : 73 Heparin Dosing Weight:   Vital Signs: BP: 119/62 (08/05 0634) Pulse Rate: 63 (08/05 0634)  Labs: Recent Labs    01/14/18 1720 01/14/18 2022 01/16/18 1041 01/17/18 0900  HGB 17.3  --  15.1  --   HCT 49.4  --  43.0  --   PLT 275  --  199  --   LABPROT  --  17.6* 17.8* 21.4*  INR  --  1.46 1.48 1.87  CREATININE 1.14  --   --   --     Estimated Creatinine Clearance: 94.1 mL/min (by C-G formula based on SCr of 1.14 mg/dL).   Medical History: Past Medical History:  Diagnosis Date  . Depression   . Diabetes mellitus without complication (HCC)   . Hyperlipidemia   . Hypertension   . Lupus anticoagulant disorder (HCC) 06/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  . PE (pulmonary thromboembolism) (HCC)   . Schizo affective schizophrenia (HCC)   . Supratherapeutic INR 11/19/2016    Medications:  Infusions:    Assessment: 42 yom cc SI with PMH DM, depression, HLD, HTN with acute psychiatric complaints. Pharmacy consulted to manage VKA while IP. Patient takes warfarin 10 mg po daily PTA  DATE INR DOSE  8/2 1.43 Missed 8/3 N/A 10 mg 8/4 1.48 10 mg 8/5 1.87   Goal of Therapy:  INR 2-3 Monitor platelets by anticoagulation  protocol: Yes   Plan:  Resume PTA warfarin 10 mg po daily. Will monitor INR daily until therapeutic x 2.  Fernanda Twaddell K, RPh  01/17/2018,9:31 AM

## 2018-01-18 LAB — PROTIME-INR
INR: 1.99
Prothrombin Time: 22.4 seconds — ABNORMAL HIGH (ref 11.4–15.2)

## 2018-01-18 LAB — GLUCOSE, CAPILLARY
GLUCOSE-CAPILLARY: 87 mg/dL (ref 70–99)
GLUCOSE-CAPILLARY: 105 mg/dL — AB (ref 70–99)
GLUCOSE-CAPILLARY: 93 mg/dL (ref 70–99)
Glucose-Capillary: 111 mg/dL — ABNORMAL HIGH (ref 70–99)

## 2018-01-18 NOTE — [facility] Group Notes (Signed)
[facility] Group Notes:  (Nursing/MHT/Case Management/Adjunct)  Date:  01/18/2018  Time:  10:38 PM  Type of Therapy:  Group Therapy  Participation Level:  Did Not Attend    Rykker Coviello D Velena Keegan 01/18/2018, 10:38 PM 

## 2018-01-18 NOTE — Progress Notes (Signed)
D: Pt denies SI/HI/AVH, verbally contracts for safety. Pt is pleasant and cooperative. Pt. has no Complaints.  Patient Interaction appropriate with staff and peers. No notable behaviors to report. Pt. Blood sugars monitored for safety per orders.    A: Q x 15 minute observation checks were completed for safety. Patient was provided with education.  Patient was given/offered medications per orders. Patient  was encourage to attend groups, participate in unit activities and continue with plan of care. Pt. Chart and plans of care reviewed. Pt. Given support and encouragement.   R: Patient is complaint with medication and unit procedures.             Precautionary checks every 15 minutes for safety maintained, room free of safety hazards, patient sustains no injury or falls during this shift. Will endorse care to next shift.

## 2018-01-18 NOTE — [facility] Group Notes (Signed)
01/18/2018 1PM  Type of Therapy/Topic:  Group Therapy:  Feelings about Diagnosis  Participation Level:  Did Not Attend   Description of Group:   This group will allow patients to explore their thoughts and feelings about diagnoses they have received. Patients will be guided to explore their level of understanding and acceptance of these diagnoses. Facilitator will encourage patients to process their thoughts and feelings about the reactions of others to their diagnosis and will guide patients in identifying ways to discuss their diagnosis with significant others in their lives. This group will be process-oriented, with patients participating in exploration of their own experiences, giving and receiving support, and processing challenge from other group members.   Therapeutic Goals: 1. Patient will demonstrate understanding of diagnosis as evidenced by identifying two or more symptoms of the disorder 2. Patient will be able to express two feelings regarding the diagnosis 3. Patient will demonstrate their ability to communicate their needs through discussion and/or role play  Summary of Patient Progress: Patient was encouraged and invited to attend group. Patient did not attend group. Social worker will continue to encourage group participation in the future.        Therapeutic Modalities:   Cognitive Behavioral Therapy Brief Therapy Feelings Identification    Johny ShearsCassandra  Malu Pellegrini, LCSW 01/18/2018 1:26 PM

## 2018-01-18 NOTE — [facility] Group Notes (Signed)
CSW Group Therapy Note  01/18/2018  Time:  0900  Type of Therapy and Topic: Group Therapy: Goals Group: SMART Goals    Participation Level:  Did Not Attend    Description of Group:   The purpose of a daily goals group is to assist and guide patients in setting recovery/wellness-related goals. The objective is to set goals as they relate to the crisis in which they were admitted. Patients will be using SMART goal modalities to set measurable goals. Characteristics of realistic goals will be discussed and patients will be assisted in setting and processing how one will reach their goal. Facilitator will also assist patients in applying interventions and coping skills learned in psycho-education groups to the SMART goal and process how one will achieve defined goal.    Therapeutic Goals:  -Patients will develop and document one goal related to or their crisis in which brought them into treatment.  -Patients will be guided by LCSW using SMART goal setting modality in how to set a measurable, attainable, realistic and time sensitive goal.  -Patients will process barriers in reaching goal.  -Patients will process interventions in how to overcome and successful in reaching goal.    Patient's Goal:  Pt was invited to attend group but chose not to attend. CSW will continue to encourage pt to attend group throughout their admission.   Therapeutic Modalities:  Motivational Interviewing  Cognitive Behavioral Therapy  Crisis Intervention Model  SMART goals setting  Genelda Roark, MSW, LCSW Clinical Social Worker 01/18/2018 9:48 AM   

## 2018-01-18 NOTE — Progress Notes (Signed)
[facility] MD Progress Note  01/18/2018 2:25 PM Ad Guttman  MRN:  [id number] Subjective:  Pt has slightly brighter affect today and less irritable. He perseverates on not wanting to go back to group home because "there is nothing to do." He is also upset because he didn't get any money this month because something was more expensive. He acknowledges that he has to return to the group home and can discuss this with his ACT team if he wants to switch. He denies SI today. When asked about HI, "He states I just get nervous around people but I can deal with it." HE denies any plan or intent of harming others. He is sleeping fine. He asks if he can have someone clip his toenails because he is a diabetic and needs it done. He is organized in thoughts. He states that he did go to group yesterday and is proud of himself for trying.   Principal Problem: <principal problem not specified> Diagnosis:   Patient Active Problem List   Diagnosis Date Noted  . Schizophrenia (HCC) [F20.9] 10/15/2017    Priority: High  . Schizophrenia, undifferentiated (HCC) [F20.3] 09/20/2017  . Tobacco use disorder [F17.200] 09/01/2016  . Schizoaffective disorder, bipolar type (HCC) [F25.0] 05/22/2016  . Diabetes mellitus without complication (HCC) [E11.9] 05/21/2016  . Hyperlipidemia [E78.5] 05/21/2016  . History of pulmonary embolism [Z86.711] 05/21/2016  . Chronic anticoagulation [Z79.01] 05/21/2016  . Hypertension [I10] 09/25/2015   Total Time spent with patient: 20 minutes  Past Psychiatric History: See h&P  Past Medical History:  Past Medical History:  Diagnosis Date  . Depression   . Diabetes mellitus without complication (HCC)   . Hyperlipidemia   . Hypertension   . Lupus anticoagulant disorder (HCC) 06/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  . PE (pulmonary thromboembolism) (HCC)   . Schizo affective schizophrenia (HCC)   . Supratherapeutic INR 11/19/2016   History reviewed. No pertinent surgical  history. Family History:  Family History  Problem Relation Age of Onset  . CAD Mother   . CAD Sister    Family Psychiatric  History: See H&P Social History:  Social History   Substance and Sexual Activity  Alcohol Use No   Comment: occassionally     Social History   Substance and Sexual Activity  Drug Use Yes  . Types: Marijuana   Comment: Last use 11/29/16    Social History   Socioeconomic History  . Marital status: Single    Spouse name: Not on file  . Number of children: Not on file  . Years of education: Not on file  . Highest education level: Not on file  Occupational History  . Not on file  Social Needs  . Financial resource strain: Not on file  . Food insecurity:    Worry: Not on file    Inability: Not on file  . Transportation needs:    Medical: Not on file    Non-medical: Not on file  Tobacco Use  . Smoking status: Current Every Day Smoker    Packs/day: 1.00    Years: 23.00    Pack years: 23.00    Types: Cigarettes  . Smokeless tobacco: Never Used  . Tobacco comment: will provide material  Substance and Sexual Activity  . Alcohol use: No    Comment: occassionally  . Drug use: Yes    Types: Marijuana    Comment: Last use 11/29/16  . Sexual activity: Never    Birth control/protection: None  Comment: occasional marijuana- none recently  Lifestyle  . Physical activity:    Days per week: Not on file    Minutes per session: Not on file  . Stress: Not on file  Relationships  . Social connections:    Talks on phone: Not on file    Gets together: Not on file    Attends religious service: Not on file    Active member of club or organization: Not on file    Attends meetings of clubs or organizations: Not on file    Relationship status: Not on file  Other Topics Concern  . Not on file  Social History Narrative   From a group home in [place]   Additional Social History:                         Sleep: Fair  Appetite:   Good  Current Medications: Current Facility-Administered Medications  Medication Dose Route Frequency Provider Last Rate Last Dose  . acetaminophen (TYLENOL) tablet 650 mg  650 mg Oral Q6H PRN Clapacs, [name], MD      . albuterol (PROVENTIL HFA;VENTOLIN HFA) 108 (90 Base) MCG/ACT inhaler 2 puff  2 puff Inhalation Q6H PRN Clapacs, [name], MD   2 puff at 01/17/18 2008  . alum & mag hydroxide-simeth (MAALOX/MYLANTA) 200-200-20 MG/5ML suspension 30 mL  30 mL Oral Q4H PRN Clapacs, [name], MD      . cloZAPine (CLOZARIL) tablet 500 mg  500 mg Oral QHS Clapacs, [name], MD   500 mg at 01/17/18 2103  . insulin aspart (novoLOG) injection 0-15 Units  0-15 Units Subcutaneous TID WC Clapacs, [name], MD   3 Units at 01/17/18 1144  . insulin detemir (LEVEMIR) injection 14 Units  14 Units Subcutaneous QHS Haskell Riling, MD   14 Units at 01/17/18 2104  . lisinopril (PRINIVIL,ZESTRIL) tablet 2.5 mg  2.5 mg Oral Daily Clapacs, [name], MD   2.5 mg at 01/18/18 0813  . lithium carbonate capsule 450 mg  450 mg Oral BID WC Clapacs, [name], MD   450 mg at 01/18/18 0814  . magnesium hydroxide (MILK OF MAGNESIA) suspension 30 mL  30 mL Oral Daily PRN Clapacs, [name], MD      . metFORMIN (GLUCOPHAGE) tablet 500 mg  500 mg Oral BID WC Clapacs, [name], MD   500 mg at 01/18/18 0814  . mometasone-formoterol (DULERA) 200-5 MCG/ACT inhaler 2 puff  2 puff Inhalation BID Clapacs, [name], MD   2 puff at 01/18/18 0813  . polyethylene glycol (MIRALAX / GLYCOLAX) packet 17 g  17 g Oral Daily Clapacs, [name], MD   17 g at 01/18/18 0813  . sertraline (ZOLOFT) tablet 50 mg  50 mg Oral Daily Clapacs, [name], MD   50 mg at 01/18/18 0814  . simvastatin (ZOCOR) tablet 40 mg  40 mg Oral q1800 Clapacs, [name], MD   40 mg at 01/17/18 1759  . traZODone (DESYREL) tablet 100 mg  100 mg Oral QHS Clapacs, [name], MD   100 mg at 01/17/18 2103  . warfarin (COUMADIN) tablet 10 mg  10 mg Oral q1800 McNew, Ileene Hutchinson, MD   10 mg at 01/17/18 1759  . Warfarin  - Pharmacist Dosing Inpatient   Does not apply q1800 McNew, Ileene Hutchinson, MD        Lab Results:  Results for orders placed or performed during the hospital encounter of 01/15/18 (from  the past 48 hour(s))  Glucose, capillary     Status: Abnormal   Collection Time: 01/16/18  4:08 PM  Result Value Ref Range   Glucose-Capillary 109 (H) 70 - 99 mg/dL  Glucose, capillary     Status: None   Collection Time: 01/16/18  8:05 PM  Result Value Ref Range   Glucose-Capillary 95 70 - 99 mg/dL  Glucose, capillary     Status: Abnormal   Collection Time: 01/17/18  6:44 AM  Result Value Ref Range   Glucose-Capillary 118 (H) 70 - 99 mg/dL  Protime-INR     Status: Abnormal   Collection Time: 01/17/18  9:00 AM  Result Value Ref Range   Prothrombin Time 21.4 (H) 11.4 - 15.2 seconds   INR 1.87     Comment: Performed at [facility], [address] [place], [place] [address]  Glucose, capillary     Status: Abnormal   Collection Time: 01/17/18 11:05 AM  Result Value Ref Range   Glucose-Capillary 184 (H) 70 - 99 mg/dL  Glucose, capillary     Status: None   Collection Time: 01/17/18  4:18 PM  Result Value Ref Range   Glucose-Capillary 97 70 - 99 mg/dL  Glucose, capillary     Status: Abnormal   Collection Time: 01/17/18  9:03 PM  Result Value Ref Range   Glucose-Capillary 104 (H) 70 - 99 mg/dL  Protime-INR     Status: Abnormal   Collection Time: 01/18/18  6:37 AM  Result Value Ref Range   Prothrombin Time 22.4 (H) 11.4 - 15.2 seconds   INR 1.99     Comment: Performed at [facility], [address] [place], [place] [address]  Glucose, capillary     Status: Abnormal   Collection Time: 01/18/18  7:08 AM  Result Value Ref Range   Glucose-Capillary 111 (H) 70 - 99 mg/dL   Comment 1 Notify RN   Glucose, capillary     Status: None   Collection Time: 01/18/18 11:27 AM  Result Value Ref Range   Glucose-Capillary 87 70 - 99 mg/dL    Blood Alcohol level:  Lab Results  Component  Value Date   ETH <10 01/14/2018   ETH <10 10/13/2017    Metabolic Disorder Labs: Lab Results  Component Value Date   HGBA1C 5.1 01/16/2018   MPG 99.67 01/16/2018   MPG 88.19 09/21/2017   Lab Results  Component Value Date   PROLACTIN 2.0 (L) 08/31/2016   PROLACTIN 4.8 05/22/2016   Lab Results  Component Value Date   CHOL 166 01/16/2018   TRIG 432 (H) 01/16/2018   HDL 30 (L) 01/16/2018   CHOLHDL 5.5 01/16/2018   VLDL UNABLE TO CALCULATE IF TRIGLYCERIDE OVER 400 mg/dL 09/81/1914   LDLCALC UNABLE TO CALCULATE IF TRIGLYCERIDE OVER 400 mg/dL 78/29/5621   LDLCALC 37 09/22/2017    Physical Findings: AIMS:  , ,  ,  ,    CIWA:    COWS:     Musculoskeletal: Strength & Muscle Tone: within normal limits Gait & Station: normal Patient leans: N/A  Psychiatric Specialty Exam: Physical Exam  Nursing note and vitals reviewed.   Review of Systems  All other systems reviewed and are negative.   Blood pressure 128/77, pulse 75, temperature (!) 97.4 F (36.3 C), temperature source Oral, resp. rate 18, height 5\' 10"  (1.778 m), weight 89.4 kg (197 lb), SpO2 100 %.Body mass index is 28.27 kg/m.  General Appearance: Casual  Eye Contact:  Minimal  Speech:  Clear and Coherent  Volume:  Normal  Mood:  Euthymic  Affect:  Appropriate  Thought Process:  Coherent and Goal Directed  Orientation:  Full (Time, Place, and Person)  Thought Content:  Logical  Suicidal Thoughts:  No  Homicidal Thoughts:  No  Memory:  Immediate;   Fair  Judgement:  Impaired  Insight:  Lacking  Psychomotor Activity:  Normal  Concentration:  Concentration: Fair  Recall:  [employer]  Fund of Knowledge:  Fair  Language:  Fair  Akathisia:  No      Assets:  Resilience  ADL's:  Intact  Cognition:  WNL  Sleep:  Number of Hours: 7.45     Treatment Plan Summary: 42 yo male admitted due to SI and HI which he frequently endorses in the context of disliking his group homes. He is at a new group and has the same  complaints as the previous one. He is calm and pleasant on the unit. He is organized and goal directed.   Plan:  Schizoaffective disorder -Continue Clozapine 500 mg qhs -Continue Lithium 450 mg BID. Lithium level 0.6 -Continue Zoloft 50 mg daily  HTN -Stable -Continue Lisinopril 2.5 mg daily  H/o PE -Pharmacy dosing  DM -Levimir 14 units qhs -metformin 500 mg BID  Needing toenail trim -Spoke with Dr. Orland Jarredroxler with podiatry who will try to arrange to come to the unit  Dispo -Pt has legal guardian and ACT team. He is able to return to group home   Haskell RilingHolly R McNew, MD 01/18/2018, 2:25 PM

## 2018-01-18 NOTE — Progress Notes (Signed)
ANTICOAGULATION CONSULT NOTE -  Pharmacy Consult for warfarin Indication: history of PE (2010)  Allergies  Allergen Reactions  . Penicillins Other (See Comments)    Reaction: "lockjaw" Has patient had a PCN reaction causing immediate rash, facial/tongue/throat swelling, SOB or lightheadedness with hypotension: No Has patient had a PCN reaction causing severe rash involving mucus membranes or skin necrosis: No Has patient had a PCN reaction that required hospitalization: No Has patient had a PCN reaction occurring within the last 10 years: No If all of the above answers are "NO", then may proceed with Cephalosporin use.     Patient Measurements: Height: 5\' 10"  (177.8 cm) Weight: 197 lb (89.4 kg) IBW/kg (Calculated) : 73 Heparin Dosing Weight:   Vital Signs: Temp: 97.4 F (36.3 C) (08/06 0628) Temp Source: Oral (08/06 0628) BP: 128/77 (08/06 0628) Pulse Rate: 75 (08/06 0628)  Labs: Recent Labs    01/16/18 1041 01/17/18 0900 01/18/18 0637  HGB 15.1  --   --   HCT 43.0  --   --   PLT 199  --   --   LABPROT 17.8* 21.4* 22.4*  INR 1.48 1.87 1.99    Estimated Creatinine Clearance: 94.1 mL/min (by C-G formula based on SCr of 1.14 mg/dL).   Medical History: Past Medical History:  Diagnosis Date  . Depression   . Diabetes mellitus without complication (HCC)   . Hyperlipidemia   . Hypertension   . Lupus anticoagulant disorder (HCC) 06/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  . PE (pulmonary thromboembolism) (HCC)   . Schizo affective schizophrenia (HCC)   . Supratherapeutic INR 11/19/2016    Medications:  Infusions:    Assessment: 42 yom cc SI with PMH DM, depression, HLD, HTN with acute psychiatric complaints. Pharmacy consulted to manage VKA while IP. Patient takes warfarin 10 mg po daily PTA  DATE INR DOSE  8/2 1.43 Missed 8/3 N/A 10 mg 8/4 1.48 10 mg 8/5 1.87 10 mg 8/6 1.99   Goal of Therapy:  INR 2-3 Monitor platelets by anticoagulation  protocol: Yes   Plan:  Continue PTA warfarin 10 mg po daily. Will monitor INR daily until therapeutic x 2.  Christon Parada A, RPh  01/18/2018,8:29 AM

## 2018-01-18 NOTE — Plan of Care (Signed)
Pt. Denies SI/HI. Pt. Verbally is able to contract for safety. Pt. Verbalizes understanding of provided education. Pt. Compliant with medications.    Problem: Education: Goal: Knowledge of General Education information will improve Description Including pain rating scale, medication(s)/side effects and non-pharmacologic comfort measures Outcome: Progressing   Problem: Health Behavior/Discharge Planning: Goal: Compliance with treatment plan for underlying cause of condition will improve Outcome: Progressing   Problem: Education: Goal: Knowledge of [place] [facility] Education information/materials will improve Outcome: Progressing   Problem: Safety: Goal: Ability to disclose and discuss suicidal ideas will improve Outcome: Progressing

## 2018-01-18 NOTE — Plan of Care (Signed)
Patient is visible but not social this shift.  Paces around the unit most of the shift and stays within common areas. Reports depression and passive SI with no plan or intent. Verbally contracts for safety while on the unit. Minimally verbal during interactions but polite and cooperative. Compliant with medications, meals and CBG checks. No insulin coverage needed for breakfast and lunch. Milieu remains safe with q 15 minute safety checks.

## 2018-01-19 LAB — PROTIME-INR
INR: 2.23
Prothrombin Time: 24.5 seconds — ABNORMAL HIGH (ref 11.4–15.2)

## 2018-01-19 LAB — GLUCOSE, CAPILLARY
GLUCOSE-CAPILLARY: 112 mg/dL — AB (ref 70–99)
GLUCOSE-CAPILLARY: 92 mg/dL (ref 70–99)
GLUCOSE-CAPILLARY: 101 mg/dL — AB (ref 70–99)
Glucose-Capillary: 81 mg/dL (ref 70–99)

## 2018-01-19 NOTE — [facility] Group Notes (Signed)
[facility] Group Notes:  (Nursing/MHT/Case Management/Adjunct)  Date:  01/19/2018  Time:  9:29 PM  Type of Therapy:  Group Therapy  Participation Level:  Did Not Attend   Lance [name] 01/19/2018, 9:29 PM 

## 2018-01-19 NOTE — Plan of Care (Signed)
  Problem: Coping: Goal: Coping ability will improve Outcome: Progressing  Patient is interacting appropriately with peers no distress noted.

## 2018-01-19 NOTE — Progress Notes (Signed)
Patient ID: Lance [name], male   DOB: 29-Jan-1976, 42 y.o.   MRN: [id number] PER STATE REGULATIONS 482.30  THIS CHART WAS REVIEWED FOR MEDICAL NECESSITY WITH RESPECT TO THE PATIENT'S ADMISSION/ DURATION OF STAY.  NEXT REVIEW DATE: 01/23/2018  Willa RoughJENNIFER JONES Kaylon Laroche, RN, BSN CASE MANAGER

## 2018-01-19 NOTE — Progress Notes (Addendum)
[facility]  01/19/2018 1:47 PM Lance [name]  MRN:  [id number] Subjective:  Pt has brighter affect today. He still mostly perseverative on disliking his group home and that he didn't get any money this month for cigarettes. He is also very focused on getting his toenails clipped because he does not want things to worsen. He was reassured that podiatry would try to get here before he leaves. He denies SI or thoughts of self harm. He also denies HI today. He states, "I just get uncomfortable around people sometimes." He has been out of his room more. HE states that he was trying to get out more. He is calm and pleasant. He is organized and goal directed.   Principal Problem: <principal problem not specified> Diagnosis:   Patient Active Problem List   Diagnosis Date Noted  . Schizophrenia (HCC) [F20.9] 10/15/2017    Priority: High  . Schizophrenia, undifferentiated (HCC) [F20.3] 09/20/2017  . Tobacco use disorder [F17.200] 09/01/2016  . Schizoaffective disorder, bipolar type (HCC) [F25.0] 05/22/2016  . Diabetes mellitus without complication (HCC) [E11.9] 05/21/2016  . Hyperlipidemia [E78.5] 05/21/2016  . History of pulmonary embolism [Z86.711] 05/21/2016  . Chronic anticoagulation [Z79.01] 05/21/2016  . Hypertension [I10] 09/25/2015   Total Time spent with patient: 20 minutes  Past Psychiatric History: Seen h&P  Past Medical History:  Past Medical History:  Diagnosis Date  . Depression   . Diabetes mellitus without complication (HCC)   . Hyperlipidemia   . Hypertension   . Lupus anticoagulant disorder (HCC) 06/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  . PE (pulmonary thromboembolism) (HCC)   . Schizo affective schizophrenia (HCC)   . Supratherapeutic INR 11/19/2016   History reviewed. No pertinent surgical history. Family History:  Family History  Problem Relation Age of Onset  . CAD Mother   . CAD Sister    Family Psychiatric  History: See H&P Social History:   Social History   Substance and Sexual Activity  Alcohol Use No   Comment: occassionally     Social History   Substance and Sexual Activity  Drug Use Yes  . Types: Marijuana   Comment: Last use 11/29/16    Social History   Socioeconomic History  . Marital status: Single    Spouse name: Not on file  . Number of children: Not on file  . Years of education: Not on file  . Highest education level: Not on file  Occupational History  . Not on file  Social Needs  . Financial resource strain: Not on file  . Food insecurity:    Worry: Not on file    Inability: Not on file  . Transportation needs:    Medical: Not on file    Non-medical: Not on file  Tobacco Use  . Smoking status: Current Every Day Smoker    Packs/day: 1.00    Years: 23.00    Pack years: 23.00    Types: Cigarettes  . Smokeless tobacco: Never Used  . Tobacco comment: will provide material  Substance and Sexual Activity  . Alcohol use: No    Comment: occassionally  . Drug use: Yes    Types: Marijuana    Comment: Last use 11/29/16  . Sexual activity: Never    Birth control/protection: None    Comment: occasional marijuana- none recently  Lifestyle  . Physical activity:    Days per week: Not on file    Minutes per session: Not on file  . Stress: Not  on file  Relationships  . Social connections:    Talks on phone: Not on file    Gets together: Not on file    Attends religious service: Not on file    Active member of club or organization: Not on file    Attends meetings of clubs or organizations: Not on file    Relationship status: Not on file  Other Topics Concern  . Not on file  Social History Narrative   From a group home in [place]   Additional Social History:                         Sleep: Good  Appetite:  Good  Current Medications: Current Facility-Administered Medications  Medication Dose Route Frequency Provider Last Rate Last Dose  . acetaminophen (TYLENOL) tablet 650  mg  650 mg Oral Q6H PRN Clapacs, [name], MD      . albuterol (PROVENTIL HFA;VENTOLIN HFA) 108 (90 Base) MCG/ACT inhaler 2 puff  2 puff Inhalation Q6H PRN Clapacs, [name], MD   2 puff at 01/17/18 2008  . alum & mag hydroxide-simeth (MAALOX/MYLANTA) 200-200-20 MG/5ML suspension 30 mL  30 mL Oral Q4H PRN Clapacs, [name], MD      . cloZAPine (CLOZARIL) tablet 500 mg  500 mg Oral QHS Clapacs, [name], MD   500 mg at 01/18/18 2128  . insulin aspart (novoLOG) injection 0-15 Units  0-15 Units Subcutaneous TID WC Clapacs, [name], MD   3 Units at 01/17/18 1144  . insulin detemir (LEVEMIR) injection 14 Units  14 Units Subcutaneous QHS Haskell Riling, MD   14 Units at 01/18/18 2128  . lisinopril (PRINIVIL,ZESTRIL) tablet 2.5 mg  2.5 mg Oral Daily Clapacs, [name], MD   2.5 mg at 01/19/18 0846  . lithium carbonate capsule 450 mg  450 mg Oral BID WC Clapacs, [name], MD   450 mg at 01/19/18 0846  . magnesium hydroxide (MILK OF MAGNESIA) suspension 30 mL  30 mL Oral Daily PRN Clapacs, [name], MD      . metFORMIN (GLUCOPHAGE) tablet 500 mg  500 mg Oral BID WC Clapacs, [name], MD   500 mg at 01/19/18 0848  . mometasone-formoterol (DULERA) 200-5 MCG/ACT inhaler 2 puff  2 puff Inhalation BID Clapacs, [name], MD   2 puff at 01/19/18 0847  . polyethylene glycol (MIRALAX / GLYCOLAX) packet 17 g  17 g Oral Daily Clapacs, [name], MD   17 g at 01/18/18 0813  . sertraline (ZOLOFT) tablet 50 mg  50 mg Oral Daily Clapacs, [name], MD   50 mg at 01/19/18 0848  . simvastatin (ZOCOR) tablet 40 mg  40 mg Oral q1800 Clapacs, [name], MD   40 mg at 01/18/18 1712  . traZODone (DESYREL) tablet 100 mg  100 mg Oral QHS Clapacs, [name], MD   100 mg at 01/18/18 2128  . warfarin (COUMADIN) tablet 10 mg  10 mg Oral q1800 Marigny Borre, Ileene Hutchinson, MD   10 mg at 01/18/18 1712  . Warfarin - Pharmacist Dosing Inpatient   Does not apply q1800 Arch Methot, Ileene Hutchinson, MD        Lab Results:  Results for orders placed or performed during the hospital encounter of  01/15/18 (from the past 48 hour(s))  Glucose, capillary     Status: None   Collection Time: 01/17/18  4:18 PM  Result Value Ref Range   Glucose-Capillary 97 70 - 99  mg/dL  Glucose, capillary     Status: Abnormal   Collection Time: 01/17/18  9:03 PM  Result Value Ref Range   Glucose-Capillary 104 (H) 70 - 99 mg/dL  Protime-INR     Status: Abnormal   Collection Time: 01/18/18  6:37 AM  Result Value Ref Range   Prothrombin Time 22.4 (H) 11.4 - 15.2 seconds   INR 1.99     Comment: Performed at [facility], [address] [place], [place] [address]  Glucose, capillary     Status: Abnormal   Collection Time: 01/18/18  7:08 AM  Result Value Ref Range   Glucose-Capillary 111 (H) 70 - 99 mg/dL   Comment 1 Notify RN   Glucose, capillary     Status: None   Collection Time: 01/18/18 11:27 AM  Result Value Ref Range   Glucose-Capillary 87 70 - 99 mg/dL  Glucose, capillary     Status: Abnormal   Collection Time: 01/18/18  4:22 PM  Result Value Ref Range   Glucose-Capillary 105 (H) 70 - 99 mg/dL  Glucose, capillary     Status: None   Collection Time: 01/18/18  8:21 PM  Result Value Ref Range   Glucose-Capillary 93 70 - 99 mg/dL   Comment 1 Notify RN   Glucose, capillary     Status: Abnormal   Collection Time: 01/19/18  7:02 AM  Result Value Ref Range   Glucose-Capillary 112 (H) 70 - 99 mg/dL   Comment 1 Notify RN   Protime-INR     Status: Abnormal   Collection Time: 01/19/18  7:16 AM  Result Value Ref Range   Prothrombin Time 24.5 (H) 11.4 - 15.2 seconds   INR 2.23     Comment: Performed at [facility], [address] [place], [place] [address]  Glucose, capillary     Status: None   Collection Time: 01/19/18 11:37 AM  Result Value Ref Range   Glucose-Capillary 81 70 - 99 mg/dL   Comment 1 Notify RN     Blood Alcohol level:  Lab Results  Component Value Date   ETH <10 01/14/2018   ETH <10 10/13/2017    Metabolic Disorder Labs: Lab Results  Component  Value Date   HGBA1C 5.1 01/16/2018   MPG 99.67 01/16/2018   MPG 88.19 09/21/2017   Lab Results  Component Value Date   PROLACTIN 2.0 (L) 08/31/2016   PROLACTIN 4.8 05/22/2016   Lab Results  Component Value Date   CHOL 166 01/16/2018   TRIG 432 (H) 01/16/2018   HDL 30 (L) 01/16/2018   CHOLHDL 5.5 01/16/2018   VLDL UNABLE TO CALCULATE IF TRIGLYCERIDE OVER 400 mg/dL 56/21/3086   LDLCALC UNABLE TO CALCULATE IF TRIGLYCERIDE OVER 400 mg/dL 57/84/6962   LDLCALC 37 09/22/2017    Physical Findings: AIMS:  , ,  ,  ,    CIWA:    COWS:     Musculoskeletal: Strength & Muscle Tone: within normal limits Gait & Station: normal Patient leans: N/A  Psychiatric Specialty Exam: Physical Exam  Nursing note and vitals reviewed.   Review of Systems  All other systems reviewed and are negative.   Blood pressure 127/82, pulse 86, temperature (!) 97.4 F (36.3 C), temperature source Oral, resp. rate 18, height 5\' 10"  (1.778 m), weight 89.4 kg (197 lb), SpO2 100 %.Body mass index is 28.27 kg/m.  General Appearance: Casual  Eye Contact:  Fair  Speech:  Clear and Coherent  Volume:  Normal  Mood:  Euthymic  Affect:  Congruent  Thought Process:  Coherent and Goal Directed  Orientation:  Full (Time, Place, and Person)  Thought Content:  Logical  Suicidal Thoughts:  No  Homicidal Thoughts:  No  Memory:  Immediate;   Fair  Judgement:  Fair  Insight:  Fair  Psychomotor Activity:  Normal  Concentration:  Concentration: Fair  Recall:  [employer] of Knowledge:  Fair  Language:  Fair  Akathisia:  No      Assets:  Resilience  ADL's:  Intact  Cognition:  WNL  Sleep:  Number of Hours: 7     Treatment Plan Summary: 42 yo male admitted due to voicing SI and HI related to disliking his group home. Mood is improving and affect is brighter. SI and HI are resolving. He is organized and goal directed.   Plan:  Schizoaffective disorder -Continue Clozapine 500 mg qhs -Continue Lithium 450 mg  BID, Lithium level 0.6 -Continue Zoloft 50 mg daily  HTN -Stable -Continue Lisinopril  H/o PE -Pharmacy dosing Coumadin  DM -Levimir 14 units qhs -Metformin 500 mg BID  Needing toenail trim -Spoke with Dr. Orland Jarred with podiatry yesterday and will try to come down to clip them prior to discharge, Consult entered  Dispo -Pt has legal guardian and ACT team. He will return to group home. Tentative discharge Friday  Haskell Riling, MD 01/19/2018, 1:47 PM

## 2018-01-19 NOTE — Plan of Care (Signed)
Problem: Safety: Goal: Ability to disclose and discuss suicidal ideas will improve Outcome: Progressing   Problem: Education: Goal: Knowledge of General Education information will improve Description Including pain rating scale, medication(s)/side effects and non-pharmacologic comfort measures Outcome: Progressing D: Pt observed pacing in milieu at intervals during shift. Presents with flat affect, fair eye contact, soft and logical speech on interactions. Denies SI, HI, AVH and pain "no, I'm fine right now". Rates his depression, anxiety and hopelessness all 0/10 when assessed. Pt is isolative / guarded. Minimal but appropriate interactions noted with others; on as need basis.  A: Encouraged pt to voice concerns, attend to his ADLs and attend groups and activities as scheduled. Emotional support and availability provided to pt. All medications administered as ordered with verbal education and effects monitored. Q 15 minutes safety checks maintained without incident thus far. R: Pt has been receptive to care. Tolerates all PO intake well. Remains medication compliant. Denies adverse drug reactions concerns at this time. POC continues for safety and mood stability.

## 2018-01-19 NOTE — Progress Notes (Signed)
Recreation Therapy Notes  Date: 01/19/2018  Time: 9:30 am   Location: Craft Room   Behavioral response: N/A   Intervention Topic: Goals  Discussion/Intervention: Patient did not attend group.   Clinical Observations/Feedback:  Patient did not attend group.   Lance [name] LRT/CTRS         Lance [name] 01/19/2018 1:36 PM 

## 2018-01-19 NOTE — Progress Notes (Signed)
ANTICOAGULATION CONSULT NOTE -  Pharmacy Consult for warfarin Indication: history of PE (2010)  Allergies  Allergen Reactions  . Penicillins Other (See Comments)    Reaction: "lockjaw" Has patient had a PCN reaction causing immediate rash, facial/tongue/throat swelling, SOB or lightheadedness with hypotension: No Has patient had a PCN reaction causing severe rash involving mucus membranes or skin necrosis: No Has patient had a PCN reaction that required hospitalization: No Has patient had a PCN reaction occurring within the last 10 years: No If all of the above answers are "NO", then may proceed with Cephalosporin use.     Patient Measurements: Height: 5\' 10"  (177.8 cm) Weight: 197 lb (89.4 kg) IBW/kg (Calculated) : 73 Heparin Dosing Weight:   Vital Signs: BP: 127/82 (08/07 0840) Pulse Rate: 86 (08/07 0840)  Labs: Recent Labs    01/16/18 1041 01/17/18 0900 01/18/18 0637 01/19/18 0716  HGB 15.1  --   --   --   HCT 43.0  --   --   --   PLT 199  --   --   --   LABPROT 17.8* 21.4* 22.4* 24.5*  INR 1.48 1.87 1.99 2.23    Estimated Creatinine Clearance: 94.1 mL/min (by C-G formula based on SCr of 1.14 mg/dL).   Medical History: Past Medical History:  Diagnosis Date  . Depression   . Diabetes mellitus without complication (HCC)   . Hyperlipidemia   . Hypertension   . Lupus anticoagulant disorder (HCC) 06/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  . PE (pulmonary thromboembolism) (HCC)   . Schizo affective schizophrenia (HCC)   . Supratherapeutic INR 11/19/2016    Medications:  Infusions:    Assessment: 42 yom cc SI with PMH DM, depression, HLD, HTN with acute psychiatric complaints. Pharmacy consulted to manage VKA while IP. Patient takes warfarin 10 mg po daily PTA  DATE INR DOSE  8/2 1.43 Missed 8/3 N/A 10 mg 8/4 1.48 10 mg 8/5 1.87 10 mg 8/6 1.99 10 mg 8/7       2.23  Goal of Therapy:  INR 2-3 Monitor platelets by anticoagulation protocol:  Yes   Plan:  Continue PTA warfarin 10 mg po daily. Will monitor INR daily until therapeutic x 2.  Leanny Moeckel K, RPh 01/19/2018,9:45 AM

## 2018-01-19 NOTE — [facility] Group Notes (Signed)
LCSW Group Therapy Note  01/19/2018 1:00 pm  Type of Therapy/Topic:  Group Therapy:  Emotion Regulation  Participation Level:  Active   Description of Group:    The purpose of this group is to assist patients in learning to regulate negative emotions and experience positive emotions. Patients will be guided to discuss ways in which they have been vulnerable to their negative emotions. These vulnerabilities will be juxtaposed with experiences of positive emotions or situations, and patients will be challenged to use positive emotions to combat negative ones. Special emphasis will be placed on coping with negative emotions in conflict situations, and patients will process healthy conflict resolution skills.  Therapeutic Goals: 1. Patient will identify two positive emotions or experiences to reflect on in order to balance out negative emotions 2. Patient will label two or more emotions that they find the most difficult to experience 3. Patient will demonstrate positive conflict resolution skills through discussion and/or role plays  Summary of Patient Progress:  [name] actively participated in today's group discussion on emotion regulation. [name] shared that she often experiences feelings of anger, frustration, and depression.  He shared that his tries to balance out his negative emotions by engaging in the positive coping skill of walking.  [name] shared that whenever he becomes frustrated or angry he often puts on his sneakers and go for a long walk.     Therapeutic Modalities:   Cognitive Behavioral Therapy Feelings Identification Dialectical Behavioral Therapy

## 2018-01-19 NOTE — Progress Notes (Signed)
D: Patient alert and oriented x 4, denies SI/HI/AVH, affect is flat but brightens upon approach. Patient is pleasant and cooperative, he appears less anxious but was noted pacing around the unit and was frequently redirected. Patient was also noted interacting with peers and staff appropriately. Patient's thoughts are organized and coherent no bizarre behavior noted.  A: Patient was offered support and encouragement, and was given scheduled medications. Patient was encouraged to attend groups. 15 minute checks were done for safety.  R:Patient attends groups and interacts appropriately with peers and staff. Pt is complaint with medication and  receptive to treatment . Safety maintained on unit.   

## 2018-01-20 LAB — GLUCOSE, CAPILLARY
GLUCOSE-CAPILLARY: 112 mg/dL — AB (ref 70–99)
GLUCOSE-CAPILLARY: 98 mg/dL (ref 70–99)
Glucose-Capillary: 74 mg/dL (ref 70–99)
Glucose-Capillary: 135 mg/dL — ABNORMAL HIGH (ref 70–99)

## 2018-01-20 LAB — PROTIME-INR
INR: 2.99
Prothrombin Time: 30.8 seconds — ABNORMAL HIGH (ref 11.4–15.2)

## 2018-01-20 MED ORDER — WARFARIN SODIUM 5 MG PO TABS
5.0000 mg | ORAL_TABLET | Freq: Once | ORAL | Status: AC
  Administered 2018-01-20: 5 mg via ORAL
  Filled 2018-01-20: qty 1

## 2018-01-20 NOTE — Plan of Care (Signed)
  Problem: Safety: Goal: Ability to disclose and discuss suicidal ideas will improve Outcome: Progressing  Patient denies SI/HI

## 2018-01-20 NOTE — NC FL2 (Signed)
[place] MEDICAID FL2 LEVEL OF CARE SCREENING TOOL     IDENTIFICATION  Patient Name: Lance [name] Birthdate: 09/02/1975 Sex: male Admission Date (Current Location): 01/15/2018  [place]   Facility and Address:  [facility], [address], [place], [place] [address]      Provider Number: [id number]  Attending Physician Name and Address:  Haskell RilingMcNew, Holly R, MD  Relative Name and Phone Number:  guardian: Victory DakinVivian Harris, [phone]    Current Level of Care: Hospital Recommended Level of Care: Other (Comment)(group home) Prior Approval Number:    Date Approved/Denied:   PASRR Number:    Discharge Plan: Other (Comment)(group home)    Current Diagnoses: Patient Active Problem List   Diagnosis Date Noted  . Schizophrenia (HCC) 10/15/2017  . 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 Tobacco use disorder 09/01/2016  . Schizoaffective disorder (HCC) 05/22/2016  . Diabetes mellitus without complication (HCC) 05/21/2016  . Hyperlipidemia 05/21/2016  . History of pulmonary embolism 05/21/2016  . Chronic anticoagulation 05/21/2016  . Hypertension 09/25/2015    Orientation RESPIRATION BLADDER Height & Weight     Self  Normal Continent Weight: 197 lb (89.4 kg) Height:  5\' 10"  (177.8 cm)  BEHAVIORAL SYMPTOMS/MOOD NEUROLOGICAL BOWEL NUTRITION STATUS  (None) (None) Continent Diet(Normal)  AMBULATORY STATUS COMMUNICATION OF NEEDS Skin   Independent Verbally Normal                       Personal Care Assistance Level of Assistance  (None)           Functional Limitations Info  (None)          SPECIAL CARE FACTORS FREQUENCY  (None)                    Contractures Contractures Info: Not present    Additional Factors Info  Allergies   Allergies Info: Penicillins           Current Medications (01/20/2018):  This is the current hospital active medication list Current  Facility-Administered Medications  Medication Dose Route Frequency Provider Last Rate Last Dose  . acetaminophen (TYLENOL) tablet 650 mg  650 mg Oral Q6H PRN Clapacs, [name], MD      . albuterol (PROVENTIL HFA;VENTOLIN HFA) 108 (90 Base) MCG/ACT inhaler 2 puff  2 puff Inhalation Q6H PRN Clapacs, Jackquline DenmarkJohn T, MD   2 puff at 01/17/18 2008  . alum & mag hydroxide-simeth (MAALOX/MYLANTA) 200-200-20 MG/5ML suspension 30 mL  30 mL Oral Q4H PRN Clapacs, [name], MD      . cloZAPine (CLOZARIL) tablet 500 mg  500 mg Oral QHS Clapacs, Jackquline DenmarkJohn T, MD   500 mg at 01/19/18 2147  . insulin aspart (novoLOG) injection 0-15 Units  0-15 Units Subcutaneous TID WC Clapacs, Jackquline DenmarkJohn T, MD   3 Units at 01/17/18 1144  . insulin detemir (LEVEMIR) injection 14 Units  14 Units Subcutaneous QHS Haskell RilingMcNew, Holly R, MD   14 Units at 01/19/18 2121  . lisinopril (PRINIVIL,ZESTRIL) tablet 2.5 mg  2.5 mg Oral Daily Clapacs, Jackquline DenmarkJohn T, MD   2.5 mg at 01/20/18 [id number]  . lithium carbonate capsule 450 mg  450 mg Oral BID WC Clapacs, Jackquline DenmarkJohn T, MD   450 mg at 01/20/18 [id number]  . magnesium hydroxide (MILK OF MAGNESIA) suspension 30 mL  30 mL Oral Daily PRN Clapacs, [name], MD      . metFORMIN (GLUCOPHAGE) tablet 500 mg  500 mg Oral BID  WC Clapacs, [name], MD   500 mg at 01/20/18 1610  . mometasone-formoterol (DULERA) 200-5 MCG/ACT inhaler 2 puff  2 puff Inhalation BID Clapacs, [name], MD   2 puff at 01/19/18 2100  . polyethylene glycol (MIRALAX / GLYCOLAX) packet 17 g  17 g Oral Daily Clapacs, [name], MD   17 g at 01/20/18 9604  . sertraline (ZOLOFT) tablet 50 mg  50 mg Oral Daily Clapacs, [name], MD   50 mg at 01/20/18 5409  . simvastatin (ZOCOR) tablet 40 mg  40 mg Oral q1800 Clapacs, [name], MD   40 mg at 01/19/18 1758  . traZODone (DESYREL) tablet 100 mg  100 mg Oral QHS Clapacs, [name], MD   100 mg at 01/19/18 2121  . warfarin (COUMADIN) tablet 10 mg  10 mg Oral q1800 McNew, Ileene Hutchinson, MD   10 mg at 01/19/18 1706  . Warfarin - Pharmacist Dosing Inpatient   Does  not apply [id number] Haskell Riling, MD   1 each at 01/19/18 1706     Discharge Medications: Please see discharge summary for a list of discharge medications.  Relevant Imaging Results:  Relevant Lab Results:   Additional Information None at this time.  Heidi Dach, LCSW

## 2018-01-20 NOTE — Progress Notes (Signed)
ANTICOAGULATION CONSULT NOTE -  Pharmacy Consult for warfarin Indication: history of PE (2010)  Allergies  Allergen Reactions  . Penicillins Other (See Comments)    Reaction: "lockjaw" Has patient had a PCN reaction causing immediate rash, facial/tongue/throat swelling, SOB or lightheadedness with hypotension: No Has patient had a PCN reaction causing severe rash involving mucus membranes or skin necrosis: No Has patient had a PCN reaction that required hospitalization: No Has patient had a PCN reaction occurring within the last 10 years: No If all of the above answers are "NO", then may proceed with Cephalosporin use.     Patient Measurements: Height: 5\' 10"  (177.8 cm) Weight: 197 lb (89.4 kg) IBW/kg (Calculated) : 73 Heparin Dosing Weight:   Vital Signs: Temp: 97.7 F (36.5 C) (08/08 0652) Temp Source: Oral (08/08 0652) BP: 113/75 (08/08 0653) Pulse Rate: 78 (08/08 0653)  Labs: Recent Labs    01/18/18 0637 01/19/18 0716 01/20/18 0747  LABPROT 22.4* 24.5* 30.8*  INR 1.99 2.23 2.99    Estimated Creatinine Clearance: 94.1 mL/min (by C-G formula based on SCr of 1.14 mg/dL).   Medical History: Past Medical History:  Diagnosis Date  . Depression   . Diabetes mellitus without complication (HCC)   . Hyperlipidemia   . Hypertension   . Lupus anticoagulant disorder (HCC) 06/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  . PE (pulmonary thromboembolism) (HCC)   . Schizo affective schizophrenia (HCC)   . Supratherapeutic INR 11/19/2016    Medications:  Infusions:    Assessment: 42 yom cc SI with PMH DM, depression, HLD, HTN with acute psychiatric complaints. Pharmacy consulted to manage VKA while IP. Patient takes warfarin 10 mg po daily PTA  DATE INR DOSE  8/2 1.43 Missed 8/3 N/A 10 mg 8/4 1.48 10 mg 8/5 1.87 10 mg 8/6 1.99 10 mg 8/7       2.23 10 mg 8/8 2.99   Goal of Therapy:  INR 2-3 Monitor platelets by anticoagulation protocol: Yes   Plan:  INR  is up to 2.99 today.  Will give dose 5mg  today and continue to follow. Will monitor INR daily until therapeutic x 2.  Aryianna Earwood K, RPh 01/20/2018,10:50 AM

## 2018-01-20 NOTE — [facility] Group Notes (Signed)
LCSW Group Therapy Note 01/20/2018 9:00 AM  Type of Therapy and Topic:  Group Therapy:  Setting Goals  Participation Level:  Did Not Attend  Description of Group: In this process group, patients discussed using strengths to work toward goals and address challenges.  Patients identified two positive things about themselves and one goal they were working on.  Patients were given the opportunity to share openly and support each other's plan for self-empowerment.  The group discussed the value of gratitude and were encouraged to have a daily reflection of positive characteristics or circumstances.  Patients were encouraged to identify a plan to utilize their strengths to work on current challenges and goals.  Therapeutic Goals 1. Patient will verbalize personal strengths/positive qualities and relate how these can assist with achieving desired personal goals 2. Patients will verbalize affirmation of peers plans for personal change and goal setting 3. Patients will explore the value of gratitude and positive focus as related to successful achievement of goals 4. Patients will verbalize a plan for regular reinforcement of personal positive qualities and circumstances.  Summary of Patient Progress: [name] was invited to today's group, but chose not to attend.      Therapeutic Modalities Cognitive Behavioral Therapy Motivational Interviewing    Alease FrameSonya S Ryane Konieczny, [place] 01/20/2018 11:51 AM

## 2018-01-20 NOTE — Plan of Care (Signed)
  Problem: Safety: Goal: Ability to disclose and discuss suicidal ideas will improve Outcome: Progressing  Patient denies SI/HI  

## 2018-01-20 NOTE — Progress Notes (Signed)
D: Patient is alert and oriented x 4, denies SI/HI/AVH, affect is flat but he brightens upon approach, his thoughts are logical and coherent, speech is soft on interaction. Patient was noted in the milieu pacing the unit intermittently, he is pleasant and cooperative.  Patient appears less anxious and he is interacting with peers and staff appropriately.  A: Patient was offered support and encouragement and given scheduled medications. Patient was given emotional support and encouraged to attend groups. 15 minutes checks were done for safety.  R:Pt attended evening group and interacted appropriately with peers and staff. Patient  is complaint with medication, safety maintained on unit, will continue to monitor

## 2018-01-20 NOTE — Plan of Care (Signed)
Patient is alert and oriented x 4, denies SI, HI and AVH. Patient is pleasant with staff and peers. Patient interaction with peers on the unit is minimal  although patient will pace the hallway. Patient has no complaints he wanted to address with nurse or MD at this time. Patient compliant with medications and meals.There are no self injurious behaviors noted. Nurse will continue to monitor. Problem: Safety: Goal: Ability to disclose and discuss suicidal ideas will improve Outcome: Progressing Goal: Ability to identify and utilize support systems that promote safety will improve Outcome: Progressing   Problem: Self-Concept: Goal: Will verbalize positive feelings about self Outcome: Progressing Goal: Level of anxiety will decrease Outcome: Progressing   Problem: Coping: Goal: Coping ability will improve Outcome: Progressing Goal: Will verbalize feelings Outcome: Progressing   Problem: Education: Goal: Knowledge of [facility] Education information/materials will improve Outcome: Progressing

## 2018-01-20 NOTE — Consult Note (Signed)
[facility]                                                      Patient Demographics  Lance [name], is a 42 y.o. male   MRN: [id number]   DOB - 1976/06/01  Admit Date - 01/15/2018    Outpatient Primary MD for the patient is Corky Downs, MD  Consult requested in the Hospital by Haskell Riling, MD, On 01/20/2018    Reason for consult pain in both feet   With History of -  Past Medical History:  Diagnosis Date  . Depression   . Diabetes mellitus without complication (HCC)   . Hyperlipidemia   . Hypertension   . Lupus anticoagulant disorder (HCC) 06/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  . PE (pulmonary thromboembolism) (HCC)   . Schizo affective schizophrenia (HCC)   . Supratherapeutic INR 11/19/2016      History reviewed. No pertinent surgical history.  in for   No chief complaint on file.    HPI  Lance [name]  is a 42 y.o. male, painful toenails bilateral.  History of severe ingrown toenails great toes with nail and matrixectomy.    Review of Systems  Alert and pleasant  In addition to the HPI above,  No Fever-chills, No Headache, No changes with Vision or hearing, No problems swallowing food or Liquids, No Chest pain, Cough or Shortness of Breath, No Abdominal pain, No Nausea or Vommitting No Blood in stool or Urine, No dysuria, No new skin rashes or bruises, No new joints pains-aches,  No new weakness, tingling, numbness in any extremity, No recent weight gain or loss, No polyuria, polydypsia or polyphagia,  A full 10 point Review of Systems was done, except as stated above, all other Review of Systems were negative.   Social History Social History   Tobacco Use  . Smoking status: Current Every Day Smoker    Packs/day: 1.00    Years: 23.00    Pack years: 23.00    Types: Cigarettes  . Smokeless tobacco: Never  Used  . Tobacco comment: will provide material  Substance Use Topics  . Alcohol use: No    Comment: occassionally    Family History Family History  Problem Relation Age of Onset  . CAD Mother   . CAD Sister     Prior to Admission medications   Medication Sig Start Date End Date Taking? Authorizing Provider  albuterol (PROVENTIL HFA;VENTOLIN HFA) 108 (90 Base) MCG/ACT inhaler Inhale 1-2 puffs into the lungs every 4 (four) hours as needed for wheezing or shortness of breath. 12/31/16   Jimmy Footman, MD  cloZAPine (CLOZARIL) 100 MG tablet Take 5 tablets (500 mg total) by mouth at bedtime. 12/03/16   Jimmy Footman, MD  fluticasone furoate-vilanterol (BREO ELLIPTA) 200-25 MCG/INH AEPB Inhale 1 puff into the lungs daily.    [provider]  Fluticasone-Salmeterol (ADVAIR) 250-50 MCG/DOSE AEPB Inhale 1 puff into the lungs 2 (two) times daily.    [provider]  insulin detemir (LEVEMIR) 100 UNIT/ML injection Inject 14 Units into the skin every morning.    [provider]  ipratropium (ATROVENT) 0.06 % nasal spray Place 1 spray into both nostrils at bedtime. Patient not taking: Reported on 01/15/2018 09/14/16   Jimmy Footman, MD  lisinopril (PRINIVIL,ZESTRIL) 2.5  MG tablet Take 2.5 mg by mouth daily.    [provider]  lithium carbonate (ESKALITH) 450 MG CR tablet Take 1 tablet (450 mg total) by mouth every 12 (twelve) hours. 09/14/16   Jimmy FootmanHernandez-Gonzalez, Andrea, MD  metFORMIN (GLUCOPHAGE) 1000 MG tablet Take 1,000 mg by mouth 2 (two) times daily with a meal.    [provider]  mometasone-formoterol (DULERA) 200-5 MCG/ACT AERO Inhale 2 puffs into the lungs 2 (two) times daily. Patient not taking: Reported on 01/15/2018 11/20/16   Altamese DillingVachhani, Vaibhavkumar, MD  nicotine (NICODERM CQ - DOSED IN MG/24 HOURS) 21 mg/24hr patch Place 21 mg onto the skin daily.    [provider]  Omega-3 Fatty Acids (FISH OIL) 1000 MG  CAPS Take 1,000 mg by mouth daily.    [provider]  polyethylene glycol (MIRALAX / GLYCOLAX) packet Take 17 g by mouth daily. 09/15/16   Jimmy FootmanHernandez-Gonzalez, Andrea, MD  sertraline (ZOLOFT) 50 MG tablet Take 1 tablet (50 mg total) by mouth daily. 10/22/17   McNew, Ileene HutchinsonHolly R, MD  simvastatin (ZOCOR) 40 MG tablet Take 40 mg by mouth at bedtime.    [provider]  traZODone (DESYREL) 100 MG tablet Take 100 mg by mouth at bedtime.     [provider]  warfarin (COUMADIN) 5 MG tablet Take 5 mg by mouth daily.     [provider]    Anti-infectives (From admission, onward)   None      Scheduled Meds: . cloZAPine  500 mg Oral QHS  . insulin aspart  0-15 Units Subcutaneous TID WC  . insulin detemir  14 Units Subcutaneous QHS  . lisinopril  2.5 mg Oral Daily  . lithium carbonate  450 mg Oral BID WC  . metFORMIN  500 mg Oral BID WC  . mometasone-formoterol  2 puff Inhalation BID  . polyethylene glycol  17 g Oral Daily  . sertraline  50 mg Oral Daily  . simvastatin  40 mg Oral q1800  . traZODone  100 mg Oral QHS  . warfarin  5 mg Oral ONCE-1800  . Warfarin - Pharmacist Dosing Inpatient   Does not apply q1800   Continuous Infusions: PRN Meds:.acetaminophen, albuterol, alum & mag hydroxide-simeth, magnesium hydroxide  Allergies  Allergen Reactions  . Penicillins Other (See Comments)    Reaction: "lockjaw" Has patient had a PCN reaction causing immediate rash, facial/tongue/throat swelling, SOB or lightheadedness with hypotension: No Has patient had a PCN reaction causing severe rash involving mucus membranes or skin necrosis: No Has patient had a PCN reaction that required hospitalization: No Has patient had a PCN reaction occurring within the last 10 years: No If all of the above answers are "NO", then may proceed with Cephalosporin use.     Physical Exam  Vitals  Blood pressure 113/75, pulse 78, temperature 97.7 F (36.5 C), temperature source  Oral, resp. rate 19, height 5\' 10"  (1.778 m), weight 89.4 kg, SpO2 100 %.  Lower Extremity exam:  Vascular:DP and PT palpable bilat.  Dermatological:e.olngated toenails bilateral.  Irregular growth to both hallux nails with some ingrowing  Neurological:wnl  Ortho:No gross deformities  Data Review  CBC Recent Labs  Lab 01/14/18 1720 01/16/18 1041  WBC 8.0 4.7  HGB 17.3 15.1  HCT 49.4 43.0  PLT 275 199  MCV 91.5 92.1  MCH 32.1 32.3  MCHC 35.1 35.0  RDW 13.1 12.9  LYMPHSABS 2.3  --   MONOABS 0.9  --   EOSABS 0.0  --  BASOSABS 0.0  --    ------------------------------------------------------------------------------------------------------------------  Chemistries  Recent Labs  Lab 01/14/18 1720  NA 143  K 3.8  CL 108  CO2 23  GLUCOSE 128*  BUN 24*  CREATININE 1.14  CALCIUM 10.0  AST 28  ALT 30  ALKPHOS 56  BILITOT 0.8   -------------------------------------------------------------------------------------------------- Assessment & Plan:Debride toenails.  Nails in general are normal in appearance just long.Hallux nails abnormal growth due to incomplete matrixectomies.  Active Problems:   Schizoaffective disorder (HCC)   Schizophrenia (HCC)   Family Communication: Plan discussed with patient .  Recardo Evangelist M.D on 01/20/2018 at 1:15 PM  Thank you for the consult, we will follow the patient with you in the Hospital.

## 2018-01-20 NOTE — Progress Notes (Signed)
[facility] MD Progress Note  01/20/2018 4:10 PM Lance [name]  MRN:  [id number] Subjective:  Pt states that he is doing well today. He is out of his room more today. He has a smile on his face. He did get his toenails clipped and is very happy about that. He states that his mood is better. He denies SI or any thoughts of self harm. He also denies HI and states, "I just get anxious around people sometimes." he did spend time outside today. He is organized and goal directed. He does not appear psychotic or manic.   Principal Problem: <principal problem not specified> Diagnosis:   Patient Active Problem List   Diagnosis Date Noted  . Schizophrenia (HCC) [F20.9] 10/15/2017    Priority: High  . Schizophrenia, undifferentiated (HCC) [F20.3] 09/20/2017  . Tobacco use disorder [F17.200] 09/01/2016  . Schizoaffective disorder (HCC) [F25.9] 05/22/2016  . Diabetes mellitus without complication (HCC) [E11.9] 05/21/2016  . Hyperlipidemia [E78.5] 05/21/2016  . History of pulmonary embolism [Z86.711] 05/21/2016  . Chronic anticoagulation [Z79.01] 05/21/2016  . Hypertension [I10] 09/25/2015   Total Time spent with patient: 20 minutes  Past Psychiatric History: See H&P  Past Medical History:  Past Medical History:  Diagnosis Date  . Depression   . Diabetes mellitus without complication (HCC)   . Hyperlipidemia   . Hypertension   . Lupus anticoagulant disorder (HCC) 06/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  . PE (pulmonary thromboembolism) (HCC)   . Schizo affective schizophrenia (HCC)   . Supratherapeutic INR 11/19/2016   History reviewed. No pertinent surgical history. Family History:  Family History  Problem Relation Age of Onset  . CAD Mother   . CAD Sister    Family Psychiatric  History: See H&P Social History:  Social History   Substance and Sexual Activity  Alcohol Use No   Comment: occassionally     Social History   Substance and Sexual Activity  Drug Use Yes  . Types:  Marijuana   Comment: Last use 11/29/16    Social History   Socioeconomic History  . Marital status: Single    Spouse name: Not on file  . Number of children: Not on file  . Years of education: Not on file  . Highest education level: Not on file  Occupational History  . Not on file  Social Needs  . Financial resource strain: Not on file  . Food insecurity:    Worry: Not on file    Inability: Not on file  . Transportation needs:    Medical: Not on file    Non-medical: Not on file  Tobacco Use  . Smoking status: Current Every Day Smoker    Packs/day: 1.00    Years: 23.00    Pack years: 23.00    Types: Cigarettes  . Smokeless tobacco: Never Used  . Tobacco comment: will provide material  Substance and Sexual Activity  . Alcohol use: No    Comment: occassionally  . Drug use: Yes    Types: Marijuana    Comment: Last use 11/29/16  . Sexual activity: Never    Birth control/protection: None    Comment: occasional marijuana- none recently  Lifestyle  . Physical activity:    Days per week: Not on file    Minutes per session: Not on file  . Stress: Not on file  Relationships  . Social connections:    Talks on phone: Not on file    Gets together: Not on file  Attends religious service: Not on file    Active member of club or organization: Not on file    Attends meetings of clubs or organizations: Not on file    Relationship status: Not on file  Other Topics Concern  . Not on file  Social History Narrative   From a group home in [place]   Additional Social History:                         Sleep: Good  Appetite:  Good  Current Medications: Current Facility-Administered Medications  Medication Dose Route Frequency Provider Last Rate Last Dose  . acetaminophen (TYLENOL) tablet 650 mg  650 mg Oral Q6H PRN Clapacs, [name], MD      . albuterol (PROVENTIL HFA;VENTOLIN HFA) 108 (90 Base) MCG/ACT inhaler 2 puff  2 puff Inhalation Q6H PRN Clapacs, [name], MD    2 puff at 01/17/18 2008  . alum & mag hydroxide-simeth (MAALOX/MYLANTA) 200-200-20 MG/5ML suspension 30 mL  30 mL Oral Q4H PRN Clapacs, [name], MD      . cloZAPine (CLOZARIL) tablet 500 mg  500 mg Oral QHS Clapacs, [name], MD   500 mg at 01/19/18 2147  . insulin aspart (novoLOG) injection 0-15 Units  0-15 Units Subcutaneous TID WC Clapacs, [name], MD   3 Units at 01/17/18 1144  . insulin detemir (LEVEMIR) injection 14 Units  14 Units Subcutaneous QHS Haskell Riling, MD   14 Units at 01/19/18 2121  . lisinopril (PRINIVIL,ZESTRIL) tablet 2.5 mg  2.5 mg Oral Daily Clapacs, [name], MD   2.5 mg at 01/20/18 6578  . lithium carbonate capsule 450 mg  450 mg Oral BID WC Clapacs, [name], MD   450 mg at 01/20/18 4696  . magnesium hydroxide (MILK OF MAGNESIA) suspension 30 mL  30 mL Oral Daily PRN Clapacs, [name], MD      . metFORMIN (GLUCOPHAGE) tablet 500 mg  500 mg Oral BID WC Clapacs, [name], MD   500 mg at 01/20/18 0823  . mometasone-formoterol (DULERA) 200-5 MCG/ACT inhaler 2 puff  2 puff Inhalation BID Clapacs, [name], MD   2 puff at 01/19/18 2100  . polyethylene glycol (MIRALAX / GLYCOLAX) packet 17 g  17 g Oral Daily Clapacs, [name], MD   17 g at 01/20/18 2952  . sertraline (ZOLOFT) tablet 50 mg  50 mg Oral Daily Clapacs, [name], MD   50 mg at 01/20/18 8413  . simvastatin (ZOCOR) tablet 40 mg  40 mg Oral q1800 Clapacs, [name], MD   40 mg at 01/19/18 1758  . traZODone (DESYREL) tablet 100 mg  100 mg Oral QHS Clapacs, [name], MD   100 mg at 01/19/18 2121  . warfarin (COUMADIN) tablet 5 mg  5 mg Oral ONCE-1800 Makenzee Choudhry, Ileene Hutchinson, MD      . Warfarin - Pharmacist Dosing Inpatient   Does not apply q1800 Haskell Riling, MD   1 each at 01/19/18 1706    Lab Results:  Results for orders placed or performed during the hospital encounter of 01/15/18 (from the past 48 hour(s))  Glucose, capillary     Status: Abnormal   Collection Time: 01/18/18  4:22 PM  Result Value Ref Range   Glucose-Capillary 105 (H) 70 - 99  mg/dL  Glucose, capillary     Status: None   Collection Time: 01/18/18  8:21 PM  Result Value Ref Range   Glucose-Capillary 93  70 - 99 mg/dL   Comment 1 Notify RN   Glucose, capillary     Status: Abnormal   Collection Time: 01/19/18  7:02 AM  Result Value Ref Range   Glucose-Capillary 112 (H) 70 - 99 mg/dL   Comment 1 Notify RN   Protime-INR     Status: Abnormal   Collection Time: 01/19/18  7:16 AM  Result Value Ref Range   Prothrombin Time 24.5 (H) 11.4 - 15.2 seconds   INR 2.23     Comment: Performed at [facility], [address] [place], [place] [address]  Glucose, capillary     Status: None   Collection Time: 01/19/18 11:37 AM  Result Value Ref Range   Glucose-Capillary 81 70 - 99 mg/dL   Comment 1 Notify RN   Glucose, capillary     Status: None   Collection Time: 01/19/18  4:15 PM  Result Value Ref Range   Glucose-Capillary 92 70 - 99 mg/dL   Comment 1 Notify RN   Glucose, capillary     Status: Abnormal   Collection Time: 01/19/18  8:57 PM  Result Value Ref Range   Glucose-Capillary 101 (H) 70 - 99 mg/dL  Glucose, capillary     Status: Abnormal   Collection Time: 01/20/18  6:59 AM  Result Value Ref Range   Glucose-Capillary 112 (H) 70 - 99 mg/dL   Comment 1 Notify RN   Protime-INR     Status: Abnormal   Collection Time: 01/20/18  7:47 AM  Result Value Ref Range   Prothrombin Time 30.8 (H) 11.4 - 15.2 seconds   INR 2.99     Comment: Performed at [facility], [address] [place], [place] [address]  Glucose, capillary     Status: None   Collection Time: 01/20/18 11:15 AM  Result Value Ref Range   Glucose-Capillary 74 70 - 99 mg/dL    Blood Alcohol level:  Lab Results  Component Value Date   ETH <10 01/14/2018   ETH <10 10/13/2017    Metabolic Disorder Labs: Lab Results  Component Value Date   HGBA1C 5.1 01/16/2018   MPG 99.67 01/16/2018   MPG 88.19 09/21/2017   Lab Results  Component Value Date   PROLACTIN 2.0 (L)  08/31/2016   PROLACTIN 4.8 05/22/2016   Lab Results  Component Value Date   CHOL 166 01/16/2018   TRIG 432 (H) 01/16/2018   HDL 30 (L) 01/16/2018   CHOLHDL 5.5 01/16/2018   VLDL UNABLE TO CALCULATE IF TRIGLYCERIDE OVER 400 mg/dL 82/95/621308/09/2017   LDLCALC UNABLE TO CALCULATE IF TRIGLYCERIDE OVER 400 mg/dL 08/65/784608/09/2017   LDLCALC 37 09/22/2017    Physical Findings: AIMS:  , ,  ,  ,    CIWA:    COWS:     Musculoskeletal: Strength & Muscle Tone: within normal limits Gait & Station: normal Patient leans: N/A  Psychiatric Specialty Exam: Physical Exam  Nursing note and vitals reviewed.   Review of Systems  All other systems reviewed and are negative.   Blood pressure 113/75, pulse 78, temperature 97.7 F (36.5 C), temperature source Oral, resp. rate 19, height 5\' 10"  (1.778 m), weight 89.4 kg, SpO2 100 %.Body mass index is 28.27 kg/m.  General Appearance: Casual  Eye Contact:  Good  Speech:  Clear and Coherent  Volume:  Normal  Mood:  Euthymic  Affect:  Appropriate  Thought Process:  Coherent and Goal Directed  Orientation:  Full (Time, Place, and Person)  Thought Content:  Logical  Suicidal Thoughts:  No  Homicidal Thoughts:  No  Memory:  Immediate;   Fair  Judgement:  Fair  Insight:  Fair  Psychomotor Activity:  Normal  Concentration:  Concentration: Fair  Recall:  [employer] of Knowledge:  Fair  Language:  Fair  Akathisia:  No      Assets:  Resilience  ADL's:  Intact  Cognition:  WNL  Sleep:  Number of Hours: 7     Treatment Plan Summary: 42 yo male admitted due to SI and HI which has now resolved. He has brighter affect. He will discharge back to group home tomorrow.  Plan:  Schizoaffective disorder -Continue Clozapine 500 mg qhs -Continue Lithium 450 mg BID -Continue Zoloft 50 mg daily  HTN -Stable  H/o PE -Pharmacy dosing  DM -Levimir 14 units qhs -Metformin 500 mg BID  Dispo -He will discharge to group home tomorrow  Haskell Riling,  MD 01/20/2018, 4:10 PM

## 2018-01-20 NOTE — [facility] Group Notes (Signed)
LCSW Group Therapy Note   01/20/2018  Time: 1PM  Type of Therapy/Topic:  Group Therapy:  Balance in Life  Participation Level:  Did Not Attend  Description of Group:   This group will address the concept of balance and how it feels and looks when one is unbalanced. Patients will be encouraged to process areas in their lives that are out of balance and identify reasons for remaining unbalanced. Facilitators will guide patients in utilizing problem-solving interventions to address and correct the stressor making their life unbalanced. Understanding and applying boundaries will be explored and addressed for obtaining and maintaining a balanced life. Patients will be encouraged to explore ways to assertively make their unbalanced needs known to significant others in their lives, using other group members and facilitator for support and feedback.  Therapeutic Goals: 1. Patient will identify two or more emotions or situations they have that consume much of in their lives. 2. Patient will identify signs/triggers that life has become out of balance:  3. Patient will identify two ways to set boundaries in order to achieve balance in their lives:  4. Patient will demonstrate ability to communicate their needs through discussion and/or role plays  Summary of Patient Progress: Pt was invited to attend group but chose not to attend. CSW will continue to encourage pt to attend group throughout their admission.   Therapeutic Modalities:   Cognitive Behavioral Therapy Solution-Focused Therapy Assertiveness Training  Heidi DachKelsey Faron Tudisco, MSW, LCSW Clinical Social Worker 01/20/2018 2:01 PM

## 2018-01-20 NOTE — Progress Notes (Signed)
D: Patient denies SI/HI/AVH, affect is flat, but he brightens upon approach, his mood is pleasant and cooperative. Patient appears less anxious and he is interacting with peers and staff appropriately. Patient's thoughts are organized and coherent no distress noted  A: Pt was offered support and encouragement and given scheduled medications. Pt was encouraged to attend groups. 15 minute checks were done for safety.  R:Pt did not attend evening group. Pt is complaint with medication, and safety maintained on unit.

## 2018-01-20 NOTE — Progress Notes (Signed)
Recreation Therapy Notes  Date: 01/20/2018  Time: 9:30 am   Location: Craft Room   Behavioral response: N/A   Intervention Topic: Time Management  Discussion/Intervention: Patient did not attend group.   Clinical Observations/Feedback:  Patient did not attend group.   Damir Leung LRT/CTRS        Lance [name] 01/20/2018 11:00 AM 

## 2018-01-21 LAB — CBC WITH DIFFERENTIAL/PLATELET
Basophils Absolute: 0 10*3/uL (ref 0–0.1)
Basophils Relative: 0 %
EOS ABS: 0 10*3/uL (ref 0–0.7)
EOS PCT: 0 %
HCT: 44.6 % (ref 40.0–52.0)
HEMOGLOBIN: 15.9 g/dL (ref 13.0–18.0)
LYMPHS ABS: 1.3 10*3/uL (ref 1.0–3.6)
Lymphocytes Relative: 26 %
MCH: 32.8 pg (ref 26.0–34.0)
MCHC: 35.6 g/dL (ref 32.0–36.0)
MCV: 92.2 fL (ref 80.0–100.0)
Monocytes Absolute: 0.7 10*3/uL (ref 0.2–1.0)
Monocytes Relative: 15 %
NEUTROS PCT: 59 %
Neutro Abs: 3 10*3/uL (ref 1.4–6.5)
Platelets: 201 10*3/uL (ref 150–440)
RBC: 4.84 MIL/uL (ref 4.40–5.90)
RDW: 12.9 % (ref 11.5–14.5)
WBC: 5.1 10*3/uL (ref 3.8–10.6)

## 2018-01-21 LAB — GLUCOSE, CAPILLARY
GLUCOSE-CAPILLARY: 208 mg/dL — AB (ref 70–99)
Glucose-Capillary: 102 mg/dL — ABNORMAL HIGH (ref 70–99)

## 2018-01-21 LAB — PROTIME-INR
INR: 2.95
PROTHROMBIN TIME: 30.5 s — AB (ref 11.4–15.2)

## 2018-01-21 MED ORDER — WARFARIN SODIUM 7.5 MG PO TABS
7.5000 mg | ORAL_TABLET | Freq: Once | ORAL | Status: DC
  Filled 2018-01-21: qty 1

## 2018-01-21 NOTE — Progress Notes (Signed)
  [facility] Discharge Plan :  Will you be returning to the same living situation after discharge:  Yes,  returning to group home At discharge, do you have transportation home?: Yes,  group home Do you have the ability to pay for your medications: Yes,  ACTT  Release of information consent forms completed and in the chart;  Patient's signature needed at discharge.  Patient to Follow up at: Follow-up Information    [address] [facility] [facility] [place] Go on 01/21/2018.   Why:  Your ACTT services will resume immediately upon discharge, and your ACT Team will visit you within 24 hours of discharge. Thank you.  Contact information: [address] [place] [address] [phone]           Next level of care provider has access to [facility] Link:no  Safety Planning and Suicide Prevention discussed: Yes,  with pt and his guardian.     Has patient been referred to the Quitline?: N/A patient is not a smoker  Patient has been referred for addiction treatment: N/A  Heidi DachKelsey Noya Santarelli, LCSW 01/21/2018, 9:10 AM

## 2018-01-21 NOTE — [facility] Group Notes (Signed)
[facility] Group Notes:  (Nursing/MHT/Case Management/Adjunct)  Date:  01/21/2018  Time:  4:19 AM  Type of Therapy:  Group Therapy  Participation Level:  Minimal  Participation Quality:  Attentive  Affect:  Flat  Cognitive:  Alert  Insight:  Appropriate  Engagement in Group:  Lacking  Modes of Intervention:  Support  Summary of Progress/Problems:  Lance BeeJessica  Pryor [name] 01/21/2018, 4:19 AM

## 2018-01-21 NOTE — Progress Notes (Signed)
ANTICOAGULATION CONSULT NOTE -  Pharmacy Consult for warfarin Indication: history of PE (2010)  Allergies  Allergen Reactions  . Penicillins Other (See Comments)    Reaction: "lockjaw" Has patient had a PCN reaction causing immediate rash, facial/tongue/throat swelling, SOB or lightheadedness with hypotension: No Has patient had a PCN reaction causing severe rash involving mucus membranes or skin necrosis: No Has patient had a PCN reaction that required hospitalization: No Has patient had a PCN reaction occurring within the last 10 years: No If all of the above answers are "NO", then may proceed with Cephalosporin use.     Patient Measurements: Height: 5\' 10"  (177.8 cm) Weight: 197 lb (89.4 kg) IBW/kg (Calculated) : 73 Heparin Dosing Weight:   Vital Signs: BP: 111/71 (08/09 0633) Pulse Rate: 84 (08/09 0633)  Labs: Recent Labs    01/19/18 0716 01/20/18 0747 01/21/18 0731  LABPROT 24.5* 30.8* 30.5*  INR 2.23 2.99 2.95    Estimated Creatinine Clearance: 94.1 mL/min (by C-G formula based on SCr of 1.14 mg/dL).   Medical History: Past Medical History:  Diagnosis Date  . Depression   . Diabetes mellitus without complication (HCC)   . Hyperlipidemia   . Hypertension   . Lupus anticoagulant disorder (HCC) 06/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  . PE (pulmonary thromboembolism) (HCC)   . Schizo affective schizophrenia (HCC)   . Supratherapeutic INR 11/19/2016    Medications:  Infusions:    Assessment: 42 yom cc SI with PMH DM, depression, HLD, HTN with acute psychiatric complaints. Pharmacy consulted to manage VKA while IP. Patient takes warfarin 10 mg po daily PTA  DATE INR DOSE  8/2 1.43 Missed 8/3 N/A 10 mg 8/4 1.48 10 mg 8/5 1.87 10 mg 8/6 1.99 10 mg 8/7       2.23 10 mg 8/8 2.99 5 mg 8/9 2.95  Goal of Therapy:  INR 2-3 Monitor platelets by anticoagulation protocol: Yes   Plan:  Will give Warfarin 7.5 mg today and continue to follow.  Will monitor INR daily until therapeutic x 2.  Reuel Lamadrid A, RPh 01/21/2018,8:34 AM

## 2018-01-21 NOTE — Progress Notes (Signed)
CSW contacted pt's guardian, Lance [name], [phone], to alert her of pt's discharge. Pt's guardian expressed understanding and agreement with pt's discharge plan. CSW will continue to coordinate with pt's guardian as needed for update/discharge planning.   Heidi DachKelsey Aysha Livecchi, MSW, LCSW Clinical Social Worker 01/21/2018 8:02 AM

## 2018-01-21 NOTE — Progress Notes (Signed)
Recreation Therapy Notes  Date: 01/21/2018  Time: 9:30 am   Location: Craft Room   Behavioral response: N/A   Intervention Topic: Life Planning  Discussion/Intervention: Patient did not attend group.   Clinical Observations/Feedback:  Patient did not attend group.   Tamya Denardo LRT/CTRS        Edsel Shives 01/21/2018 10:25 AM

## 2018-01-21 NOTE — Progress Notes (Signed)
Clozapine REMS Monitoring  8/9  ANC 3.0  Patient is a 42yo male with orders to continue Clozapine 500mg  at bedtime.    Verified eligibility with Clozapine REMS website. Labs are acceptable. Next lab draw in 7 days.  Bari MantisKristin Alisse Tuite PharmD Clinical Pharmacist 01/21/2018

## 2018-01-21 NOTE — Progress Notes (Signed)
Recreation Therapy Notes  INPATIENT RECREATION TR PLAN  Patient Details Name: Lance [name] MRN: [id number] DOB: 08/28/1975 Today's Date: 01/21/2018  Rec Therapy Plan Is patient appropriate for Therapeutic Recreation?: Yes Treatment times per week: at least 3 Estimated Length of Stay: 5-7 days TR Treatment/Interventions: Group participation (Comment)  Discharge Criteria Pt will be discharged from therapy if:: Discharged Treatment plan/goals/alternatives discussed and agreed upon by:: Patient/family  Discharge Summary Short term goals set: Patient will identify 3 positive coping skills to decrease depressive symptoms within 5 recreation therapy group sessions Short term goals met: Not met Reason goals not met: Patient did not attend any groups Therapeutic equipment acquired: N/A Reason patient discharged from therapy: Discharge from hospital Pt/family agrees with progress & goals achieved: Yes Date patient discharged from therapy: 01/21/18   Esiah Bazinet 01/21/2018, 3:17 PM

## 2018-01-21 NOTE — Progress Notes (Signed)
Patient ID: Lance [name], male   DOB: Jan 21, 1976, 42 y.o.   MRN: [id number]   Discharge Note:  Patient denies SI/HI/AVH at this time. Discharge instructions, AVS, prescriptions, and transition record gone over with patient. Patient agrees to comply with medication management, follow-up visit, and outpatient therapy. Patient belongings returned to patient. Patient questions and concerns addressed and answered. Patient ambulatory off unit. Patient discharged to home with group home staff.

## 2018-01-21 NOTE — Discharge Summary (Addendum)
Physician Discharge Summary Note  Patient:  Lance [name] is an 42 y.o., male MRN:  [id number] DOB:  1975-07-10 Patient phone:  [phone] (home)  Patient address:   [facility] [address] [place] [address],  Total Time spent with patient: 15 minutes  Plus 20 minutes of medication reconciliation, discharge planning, and discharge documentation   Date of Admission:  01/15/2018 Date of Discharge: 01/21/18  Reason for Admission:  SI, HI  Principal Problem/Discharge diagnosis: Schizoaffective disorder (HCC)  Past Psychiatric History: \See H&P  Past Medical History:  Past Medical History:  Diagnosis Date  . Depression   . Diabetes mellitus without complication (HCC)   . Hyperlipidemia   . Hypertension   . Lupus anticoagulant disorder (HCC) 06/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  . PE (pulmonary thromboembolism) (HCC)   . Schizo affective schizophrenia (HCC)   . Supratherapeutic INR 11/19/2016   History reviewed. No pertinent surgical history. Family History:  Family History  Problem Relation Age of Onset  . CAD Mother   . CAD Sister    Family Psychiatric  History: See H&P Social History:  Social History   Substance and Sexual Activity  Alcohol Use No   Comment: occassionally     Social History   Substance and Sexual Activity  Drug Use Yes  . Types: Marijuana   Comment: Last use 11/29/16    Social History   Socioeconomic History  . Marital status: Single    Spouse name: Not on file  . Number of children: Not on file  . Years of education: Not on file  . Highest education level: Not on file  Occupational History  . Not on file  Social Needs  . Financial resource strain: Not on file  . Food insecurity:    Worry: Not on file    Inability: Not on file  . Transportation needs:    Medical: Not on file    Non-medical: Not on file  Tobacco Use  . Smoking status: Current Every Day Smoker    Packs/day: 1.00    Years: 23.00     Pack years: 23.00    Types: Cigarettes  . Smokeless tobacco: Never Used  . Tobacco comment: will provide material  Substance and Sexual Activity  . Alcohol use: No    Comment: occassionally  . Drug use: Yes    Types: Marijuana    Comment: Last use 11/29/16  . Sexual activity: Never    Birth control/protection: None    Comment: occasional marijuana- none recently  Lifestyle  . Physical activity:    Days per week: Not on file    Minutes per session: Not on file  . Stress: Not on file  Relationships  . Social connections:    Talks on phone: Not on file    Gets together: Not on file    Attends religious service: Not on file    Active member of club or organization: Not on file    Attends meetings of clubs or organizations: Not on file    Relationship status: Not on file  Other Topics Concern  . Not on file  Social History Narrative   From a group [facility] Course:  Pt was restarted on home medications with no changes. He was calm and appropriate on unit through hospitalization .Podiatry was consulted due to pain in toes and required toe nail clipping. On day of discharge, pt had brighter affect. He denied  SI or any thoughts of self harm. He denied  HI or any thoughts of harming others. Denied AH, VH. He was organized and goal directed. He was coming out of his room more and interacting well with others.  The patient is at low risk of imminent suicide. Patient denied thoughts, intent, or plan for harm to self or others, expressed significant future orientation, and expressed an ability to mobilize assistance for his needs. he is presently void of any contributing psychiatric symptoms, cognitive difficulties, or substance use which would elevate his risk for lethality. Chronic risk for lethality is elevated in light of chronic mental illness, financial issues. The chronic risk is presently mitigated by his ongoing desire and engagement in [facility] treatment and mobilization  of support from family and friends. Chronic risk may elevate if he/she experiences any significant loss or worsening of symptoms, which can be managed and monitored through outpatient providers. At this time,a cute risk for lethality is low and he is stable for ongoing outpatient management.   Modifiable risk factors were addressed during this hospitalization through appropriate pharmacotherapy and establishment of outpatient follow-up treatment. Some risk factors for suicide are situational (i.e. Unstable housing) or related personality pathology (i.e. Poor coping mechanisms) and thus cannot be further mitigated by continued hospitalization in this setting.    Physical Findings: AIMS:  , ,  ,  ,    CIWA:    COWS:     Musculoskeletal: Strength & Muscle Tone: within normal limits Gait & Station: normal Patient leans: N/A  Psychiatric Specialty Exam: Physical Exam  Nursing note and vitals reviewed.   Review of Systems  All other systems reviewed and are negative.   Blood pressure 111/71, pulse 84, temperature 97.7 F (36.5 C), temperature source Oral, resp. rate 19, height 5\' 10"  (1.778 m), weight 89.4 kg, SpO2 100 %.Body mass index is 28.27 kg/m.  General Appearance: Casual  Eye Contact:  Good  Speech:  Clear and Coherent  Volume:  Normal  Mood:  Euthymic  Affect:  Appropriate  Thought Process:  Coherent and Goal Directed  Orientation:  Full (Time, Place, and Person)  Thought Content:  Logical  Suicidal Thoughts:  No  Homicidal Thoughts:  No  Memory:  Immediate;   Fair  Judgement:  Impaired  Insight:  Lacking  Psychomotor Activity:  Normal  Concentration:  Concentration: Fair  Recall:  Fair  Fund of Knowledge:  Fair  Language:  Fair  Akathisia:  No      Assets:  Resilience  ADL's:  Intact  Cognition:  WNL  Sleep:  Number of Hours: 7        Has this patient used any form of tobacco in the last 30 days? (Cigarettes, Smokeless Tobacco, Cigars, and/or Pipes) Yes,  Yes, A prescription for an FDA-approved tobacco cessation medication was offered at discharge and the patient refused  Blood Alcohol level:  Lab Results  Component Value Date   [facility] 01/14[facility]2018   ETH <10 10/13/2017    Metabolic Disorder Labs:  Lab Results  Component Value Date   HGBA1C 5.1 01/16/2018   MPG 99.67 01/16/2018   MPG 88.19 09/21/2017   Lab Results  Component Value Date   PROLACTIN 2.0 (L) 08/31/2016   PROLACTIN 4.8 05/22/2016   Lab Results  Component Value Date   CHOL 166 01/16/2018   TRIG 432 (H) 01/16/2018   HDL 30 (L) 01/16/2018   CHOLHDL 5.5 01/16/2018   VLDL UNABLE TO CALCULATE IF TRIGLYCERIDE OVER 400 mg/dL  01/16/2018   LDLCALC UNABLE TO CALCULATE IF TRIGLYCERIDE OVER 400 mg/dL 16/03/9603   LDLCALC 37 09/22/2017    See Psychiatric Specialty Exam and Suicide Risk Assessment completed by Attending Physician prior to discharge.  Discharge destination:  Home  Is patient on multiple antipsychotic therapies at discharge:  No   Has Patient had three or more failed trials of antipsychotic monotherapy by history:  No  Recommended Plan for Multiple Antipsychotic Therapies: NA  Discharge Instructions    Increase activity slowly   Complete by:  As directed      Allergies as of 01/21/2018      Reactions   Penicillins Other (See Comments)   Reaction: "lockjaw" Has patient had a PCN reaction causing immediate rash, facial/tongue/throat swelling, SOB or lightheadedness with hypotension: No Has patient had a PCN reaction causing severe rash involving mucus membranes or skin necrosis: No Has patient had a PCN reaction that required hospitalization: No Has patient had a PCN reaction occurring within the last 10 years: No If all of the above answers are "NO", then may proceed with Cephalosporin use.      Medication List    TAKE these medications     Indication  albuterol 108 (90 Base) MCG/ACT inhaler Commonly known as:  PROVENTIL HFA;VENTOLIN HFA Inhale  1-2 puffs into the lungs every 4 (four) hours as needed for wheezing or shortness of breath.  Indication:  Asthma   BREO ELLIPTA 200-25 MCG/INH Aepb Generic drug:  fluticasone furoate-vilanterol Inhale 1 puff into the lungs daily.  Indication:  Asthma   cloZAPine 100 MG tablet Commonly known as:  CLOZARIL Take 5 tablets (500 mg total) by mouth at bedtime.  Indication:  Schizophrenia   Fish Oil 1000 MG Caps Take 1,000 mg by mouth daily.  Indication:  Omega   Fluticasone-Salmeterol 250-50 MCG/DOSE Aepb Commonly known as:  ADVAIR Inhale 1 puff into the lungs 2 (two) times daily.  Indication:  Asthma   insulin detemir 100 UNIT/ML injection Commonly known as:  LEVEMIR Inject 14 Units into the skin every morning.  Indication:  Type 2 Diabetes   lisinopril 2.5 MG tablet Commonly known as:  PRINIVIL,ZESTRIL Take 2.5 mg by mouth daily.  Indication:  High Blood Pressure Disorder   lithium carbonate 450 MG CR tablet Commonly known as:  ESKALITH Take 1 tablet (450 mg total) by mouth every 12 (twelve) hours.  Indication:  Manic-Depression   metFORMIN 1000 MG tablet Commonly known as:  GLUCOPHAGE Take 1,000 mg by mouth 2 (two) times daily with a meal.  Indication:  Type 2 Diabetes   nicotine 21 mg/24hr patch Commonly known as:  NICODERM CQ - dosed in mg/24 hours Place 21 mg onto the skin daily.  Indication:  Nicotine Addiction   polyethylene glycol packet Commonly known as:  MIRALAX / GLYCOLAX Take 17 g by mouth daily.  Indication:  Constipation   sertraline 50 MG tablet Commonly known as:  ZOLOFT Take 1 tablet (50 mg total) by mouth daily.  Indication:  Major Depressive Disorder, Obsessive Compulsive Disorder   simvastatin 40 MG tablet Commonly known as:  ZOCOR Take 40 mg by mouth at bedtime.  Indication:  High Amount of Fats in the Blood   traZODone 100 MG tablet Commonly known as:  DESYREL Take 100 mg by mouth at bedtime.  Indication:  Trouble Sleeping    warfarin 5 MG tablet Commonly known as:  COUMADIN Take 5 mg by mouth daily.  Indication:  Blockage of Blood Vessel to Lung by  a Particle      Follow-up Information    [address] [facility] [place] Go on 01/21/2018.   Why:  Your ACTT services will resume immediately upon discharge, and your ACT Team will visit you within 24 hours of discharge. Thank you.  Contact information: [address] [place] [address] [phone]           Signed: Haskell Riling, MD 01/21/2018, 12:07 PM

## 2018-01-21 NOTE — [facility] Suicide Risk Assessment (Signed)
[facility]   Principal Problem: <principal problem not specified> Discharge Diagnoses:  Patient Active Problem List   Diagnosis Date Noted  . Schizophrenia (HCC) [F20.9] 10/15/2017    Priority: High  . Schizophrenia, undifferentiated (HCC) [F20.3] 09/20/2017  . Tobacco use disorder [F17.200] 09/01/2016  . Schizoaffective disorder (HCC) [F25.9] 05/22/2016  . Diabetes mellitus without complication (HCC) [E11.9] 05/21/2016  . Hyperlipidemia [E78.5] 05/21/2016  . History of pulmonary embolism [Z86.711] 05/21/2016  . Chronic anticoagulation [Z79.01] 05/21/2016  . Hypertension [I10] 09/25/2015    Mental Status Per Nursing Assessment::   On Admission:  Suicidal ideation indicated by patient, Suicide plan  Demographic Factors:  Male, Caucasian, Low socioeconomic status and Unemployed  Loss Factors: Financial problems/change in socioeconomic status  Historical Factors: NA  Risk Reduction Factors:   Living with another person, especially a relative and Positive social support  Continued Clinical Symptoms:  More than one psychiatric diagnosis Previous Psychiatric Diagnoses and Treatments  Cognitive Features That Contribute To Risk:  None    Suicide Risk:  Minimal: No identifiable suicidal ideation.  Patients presenting with no risk factors but with morbid ruminations; may be classified as minimal risk based on the severity of the depressive symptoms  Follow-up Information    [place] [facility] [place] Go on 01/21/2018.   Why:  Your ACTT services will resume immediately upon discharge, and your ACT Team will visit you within 24 hours of discharge. Thank you.  Contact information: [address] [place] [address] [phone]            Haskell RilingHolly R Kaliah Haddaway, MD 01/21/2018, 9:12 AM

## 2018-01-31 DIAGNOSIS — Z79899 Other long term (current) drug therapy: Secondary | ICD-10-CM | POA: Diagnosis not present

## 2018-01-31 DIAGNOSIS — Z5181 Encounter for therapeutic drug level monitoring: Secondary | ICD-10-CM | POA: Diagnosis not present

## 2018-02-03 ENCOUNTER — Ambulatory Visit: Payer: Medicare Other | Admitting: Podiatry

## 2018-03-03 DIAGNOSIS — Z8659 Personal history of other mental and behavioral disorders: Secondary | ICD-10-CM | POA: Diagnosis not present

## 2018-03-03 DIAGNOSIS — E1141 Type 2 diabetes mellitus with diabetic mononeuropathy: Secondary | ICD-10-CM | POA: Diagnosis not present

## 2018-03-03 DIAGNOSIS — I2782 Chronic pulmonary embolism: Secondary | ICD-10-CM | POA: Diagnosis not present

## 2018-03-03 DIAGNOSIS — Z0000 Encounter for general adult medical examination without abnormal findings: Secondary | ICD-10-CM | POA: Diagnosis not present

## 2018-03-03 DIAGNOSIS — F3131 Bipolar disorder, current episode depressed, mild: Secondary | ICD-10-CM | POA: Diagnosis not present

## 2018-03-25 DIAGNOSIS — Z5181 Encounter for therapeutic drug level monitoring: Secondary | ICD-10-CM | POA: Diagnosis not present

## 2018-03-25 DIAGNOSIS — Z79899 Other long term (current) drug therapy: Secondary | ICD-10-CM | POA: Diagnosis not present

## 2018-04-04 DIAGNOSIS — I2782 Chronic pulmonary embolism: Secondary | ICD-10-CM | POA: Diagnosis not present

## 2018-04-04 DIAGNOSIS — E1141 Type 2 diabetes mellitus with diabetic mononeuropathy: Secondary | ICD-10-CM | POA: Diagnosis not present

## 2018-04-04 DIAGNOSIS — Z8659 Personal history of other mental and behavioral disorders: Secondary | ICD-10-CM | POA: Diagnosis not present

## 2018-04-04 DIAGNOSIS — F3131 Bipolar disorder, current episode depressed, mild: Secondary | ICD-10-CM | POA: Diagnosis not present

## 2018-04-08 IMAGING — CT CT HEAD W/O CM
3 of 4 series · 16 of 47 positions shown, 19 images · non-contrast
Comparison: None.

CLINICAL DATA: Headache and vomiting.

EXAM:
CT HEAD WITHOUT CONTRAST
TECHNIQUE: Contiguous axial images were obtained from the base of the skull
through the vertex without intravenous contrast.

[Series 2: head wo · axial · 0.47mm/px · z∈[-176,-36]mm · 10 of 32 slices shown, 13 images]
[im 2/32  brain]
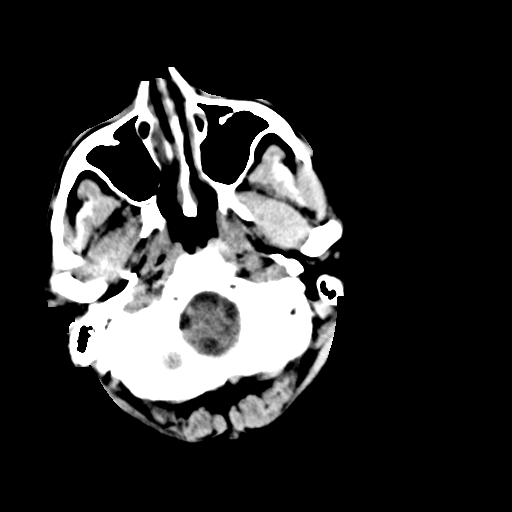
[im 2/32  bone]
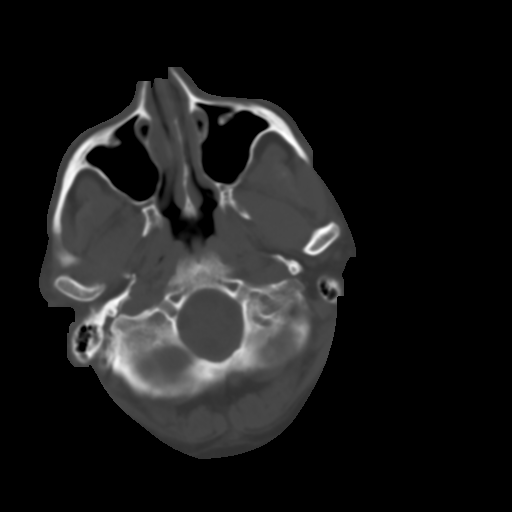
[im 5/32  brain]
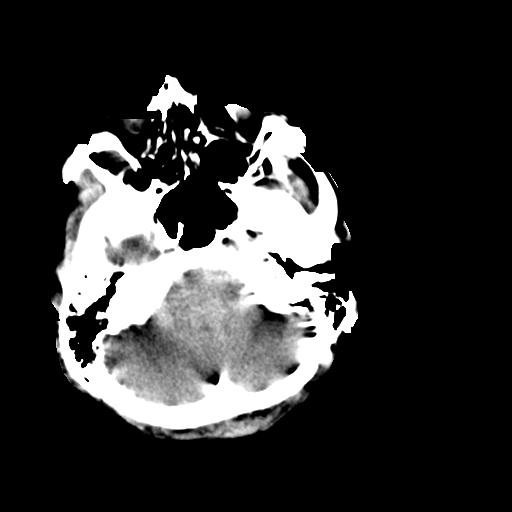
[im 8/32  brain]
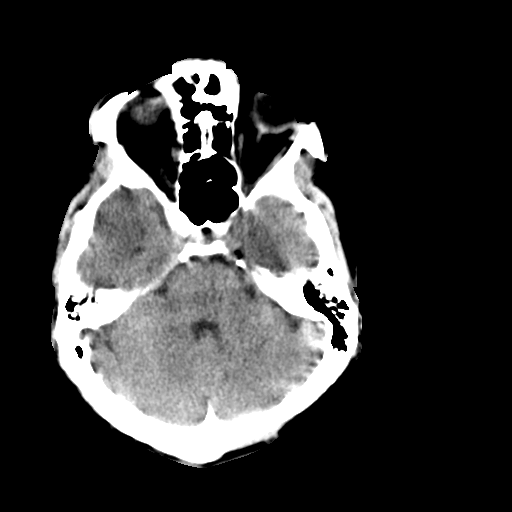
[im 11/32  brain]
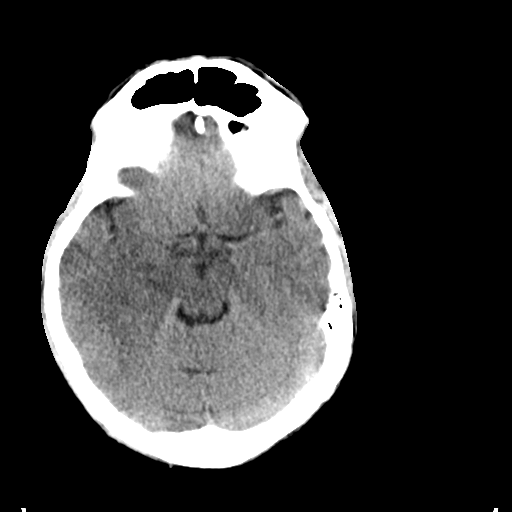
[im 14/32  brain]
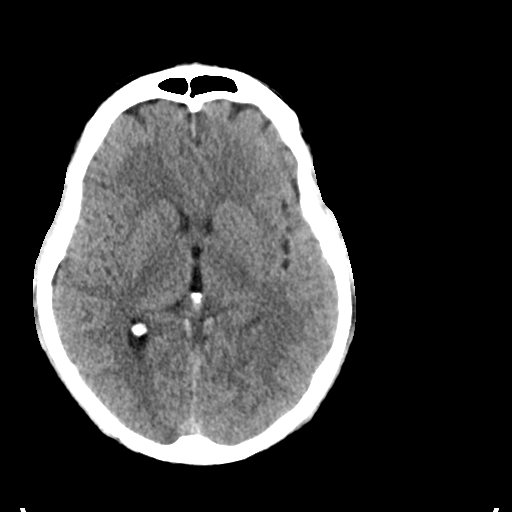
[im 14/32  bone]
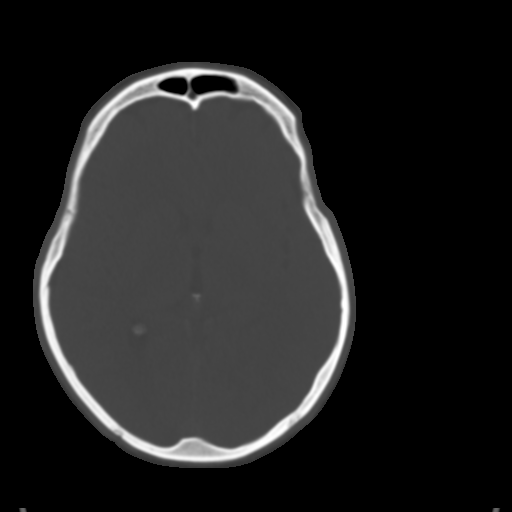
[im 18/32  brain]
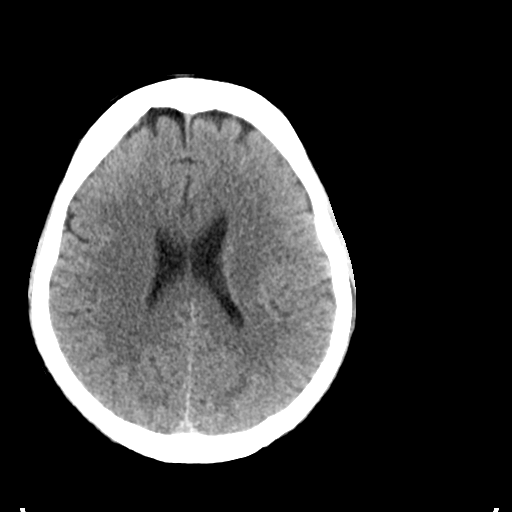
[im 21/32  brain]
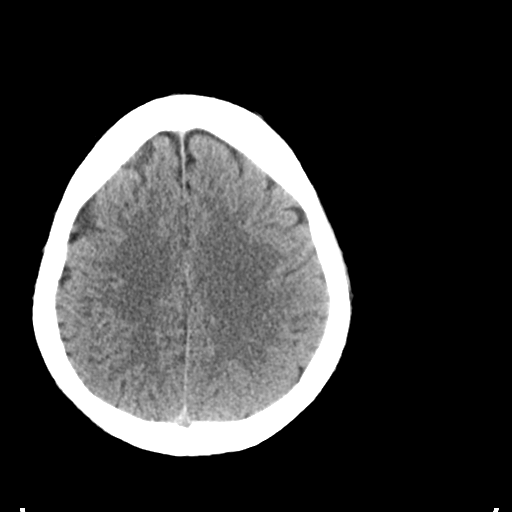
[im 24/32  brain]
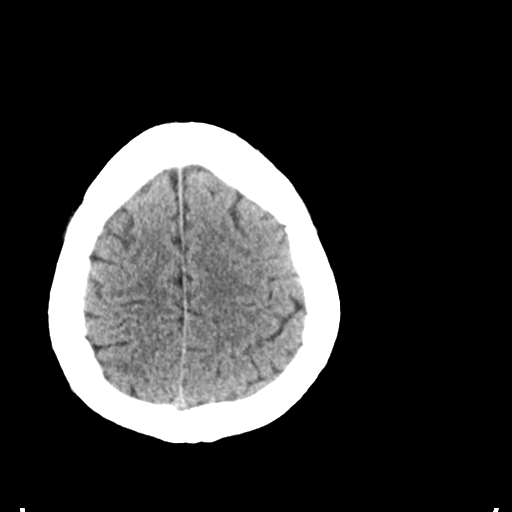
[im 27/32  brain]
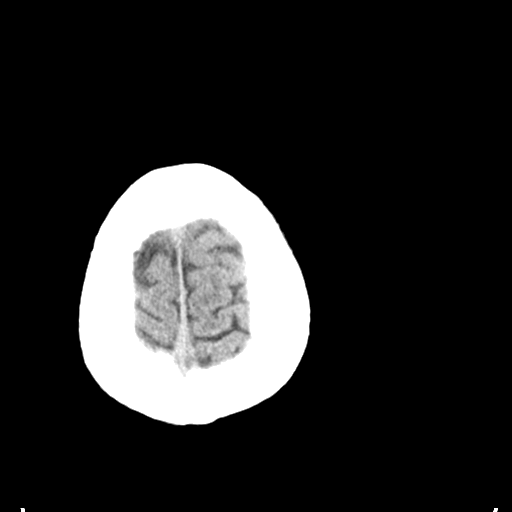
[im 27/32  bone]
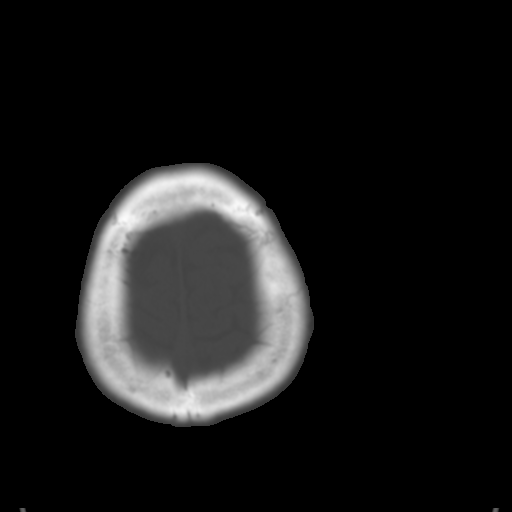
[im 30/32  brain]
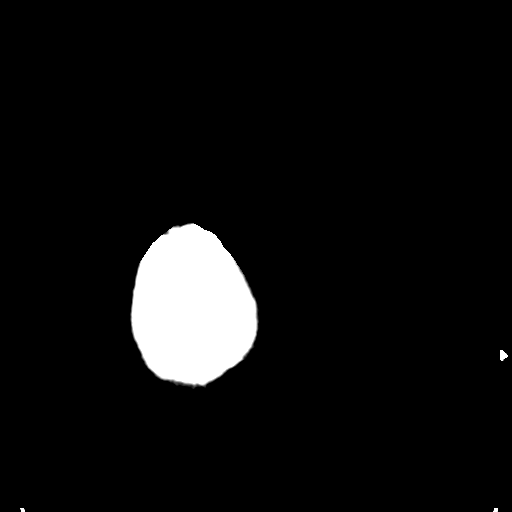

[Series 4: coronal soft tissue · coronal · 0.31mm/px · 3 of 67 slices shown]
[im 24/67  brain]
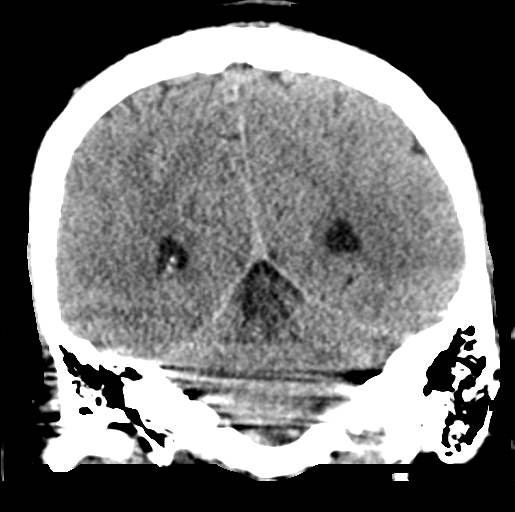
[im 30/67  brain]
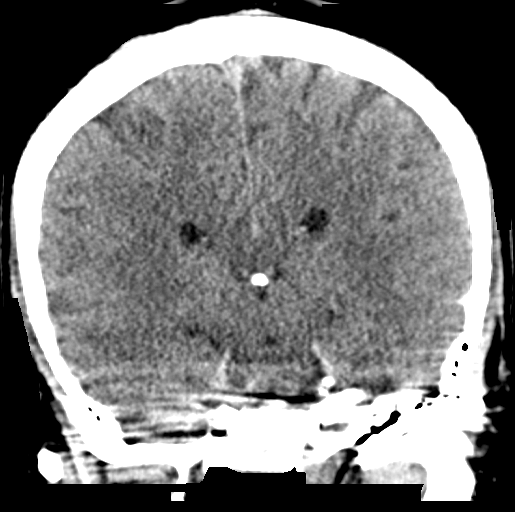
[im 37/67  brain]
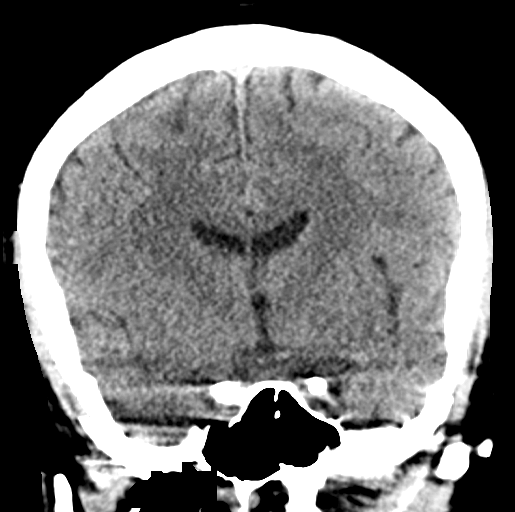

[Series 5: sagittal soft tissue · sagittal · 0.30mm/px · 3 of 56 slices shown]
[im 19/56  brain]
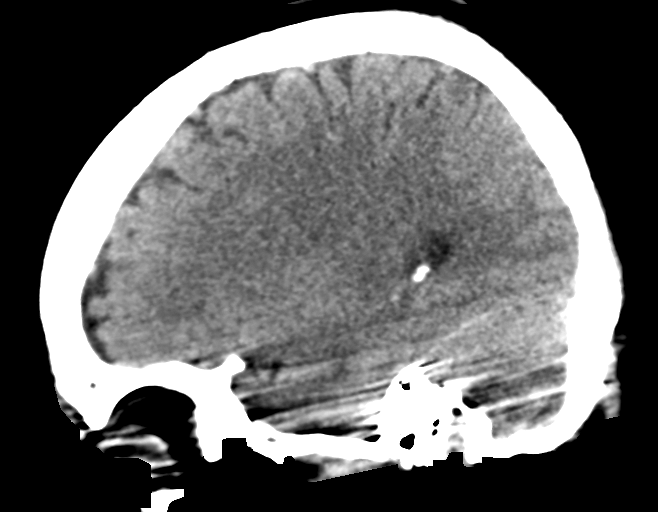
[im 28/56  brain]
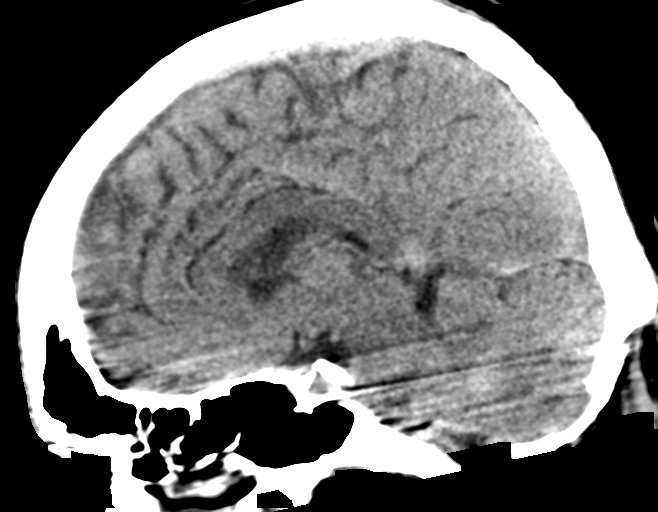
[im 37/56  brain]
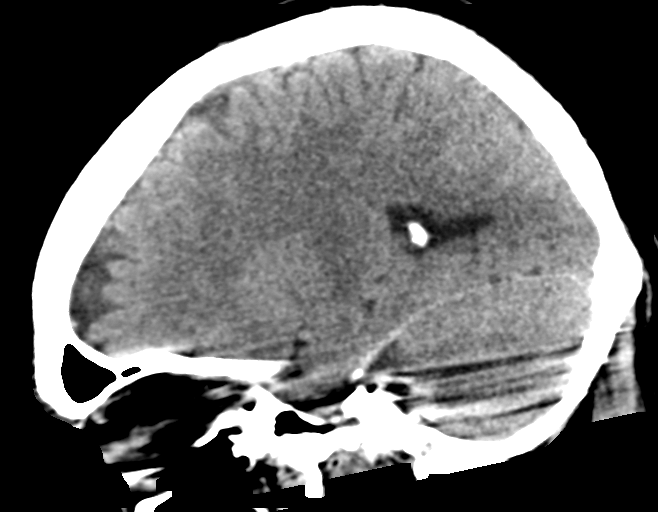

[16 of 47 positions shown; findings below may reference images not displayed]

FINDINGS: Brain: No evidence of acute hemorrhage, hydrocephalus, extra-axial
collection or mass lesion/mass effect. Study is affected of motion
artifact. Questionable areas of hypoattenuation in bilateral
occipital lobes.

Vascular: No hyperdense vessel or unexpected calcification.

Skull: Normal. Negative for fracture or focal lesion.

Sinuses/Orbits: No acute finding.

Other: None.
IMPRESSION: Study affected by motion artifact.

Questionable areas of hypoattenuation in bilateral occipital lobes.
These may be artifactual, or they may represent areas of
age-indeterminate infarctions or edema such as seen in posterior
reversal encephalopathy syndrome.

## 2018-04-12 ENCOUNTER — Encounter: Payer: Self-pay | Admitting: Emergency Medicine

## 2018-04-12 ENCOUNTER — Emergency Department
Admission: EM | Admit: 2018-04-12 | Discharge: 2018-04-12 | Disposition: A | Discharge status: Home / Self Care | Payer: Medicare Other | Attending: Emergency Medicine | Admitting: Emergency Medicine

## 2018-04-12 DIAGNOSIS — Z79899 Other long term (current) drug therapy: Secondary | ICD-10-CM | POA: Diagnosis not present

## 2018-04-12 DIAGNOSIS — I10 Essential (primary) hypertension: Secondary | ICD-10-CM | POA: Diagnosis not present

## 2018-04-12 DIAGNOSIS — F10229 Alcohol dependence with intoxication, unspecified: Secondary | ICD-10-CM | POA: Insufficient documentation

## 2018-04-12 DIAGNOSIS — F209 Schizophrenia, unspecified: Secondary | ICD-10-CM | POA: Insufficient documentation

## 2018-04-12 DIAGNOSIS — F319 Bipolar disorder, unspecified: Secondary | ICD-10-CM | POA: Diagnosis present

## 2018-04-12 DIAGNOSIS — E785 Hyperlipidemia, unspecified: Secondary | ICD-10-CM | POA: Diagnosis present

## 2018-04-12 DIAGNOSIS — F10929 Alcohol use, unspecified with intoxication, unspecified: Secondary | ICD-10-CM

## 2018-04-12 DIAGNOSIS — Z7901 Long term (current) use of anticoagulants: Secondary | ICD-10-CM

## 2018-04-12 DIAGNOSIS — Z794 Long term (current) use of insulin: Secondary | ICD-10-CM | POA: Insufficient documentation

## 2018-04-12 DIAGNOSIS — R112 Nausea with vomiting, unspecified: Secondary | ICD-10-CM | POA: Diagnosis not present

## 2018-04-12 DIAGNOSIS — F250 Schizoaffective disorder, bipolar type: Secondary | ICD-10-CM | POA: Diagnosis not present

## 2018-04-12 DIAGNOSIS — R110 Nausea: Secondary | ICD-10-CM

## 2018-04-12 DIAGNOSIS — F329 Major depressive disorder, single episode, unspecified: Secondary | ICD-10-CM | POA: Diagnosis not present

## 2018-04-12 DIAGNOSIS — F259 Schizoaffective disorder, unspecified: Secondary | ICD-10-CM | POA: Diagnosis present

## 2018-04-12 DIAGNOSIS — E119 Type 2 diabetes mellitus without complications: Secondary | ICD-10-CM | POA: Diagnosis not present

## 2018-04-12 DIAGNOSIS — F32A Depression, unspecified: Secondary | ICD-10-CM

## 2018-04-12 DIAGNOSIS — F1010 Alcohol abuse, uncomplicated: Secondary | ICD-10-CM

## 2018-04-12 LAB — COMPREHENSIVE METABOLIC PANEL
ALBUMIN: 4.9 g/dL (ref 3.5–5.0)
ALK PHOS: 50 U/L (ref 38–126)
ALT: 17 U/L (ref 0–44)
ANION GAP: 12 (ref 5–15)
AST: 17 U/L (ref 15–41)
BUN: 14 mg/dL (ref 6–20)
CHLORIDE: 107 mmol/L (ref 98–111)
CO2: 25 mmol/L (ref 22–32)
CREATININE: 0.85 mg/dL (ref 0.61–1.24)
Calcium: 9.9 mg/dL (ref 8.9–10.3)
GFR calc non Af Amer: >60 mL/min (ref >60)
Glucose, Bld: 130 mg/dL — ABNORMAL HIGH (ref 70–99)
Potassium: 3.9 mmol/L (ref 3.5–5.1)
SODIUM: 144 mmol/L (ref 135–145)
Total Bilirubin: 1.1 mg/dL (ref 0.3–1.2)
Total Protein: 7.5 g/dL (ref 6.5–8.1)

## 2018-04-12 LAB — URINE DRUG SCREEN, QUALITATIVE (ARMC ONLY)
Amphetamines, Ur Screen: NOT DETECTED
BARBITURATES, UR SCREEN: NOT DETECTED
Benzodiazepine, Ur Scrn: NOT DETECTED
CANNABINOID 50 NG, UR ~~LOC~~: NOT DETECTED
COCAINE METABOLITE, UR ~~LOC~~: NOT DETECTED
MDMA (Ecstasy)Ur Screen: NOT DETECTED
Methadone Scn, Ur: NOT DETECTED
OPIATE, UR SCREEN: NOT DETECTED
Phencyclidine (PCP) Ur S: NOT DETECTED
Tricyclic, Ur Screen: NOT DETECTED

## 2018-04-12 LAB — CBC
HCT: 46.2 % (ref 39.0–52.0)
Hemoglobin: 15.7 g/dL (ref 13.0–17.0)
MCH: 31.5 pg (ref 26.0–34.0)
MCHC: 34 g/dL (ref 30.0–36.0)
MCV: 92.6 fL (ref 80.0–100.0)
NRBC: 0 % (ref 0.0–0.2)
PLATELETS: 260 10*3/uL (ref 150–400)
RBC: 4.99 MIL/uL (ref 4.22–5.81)
RDW: 12.7 % (ref 11.5–15.5)
WBC: 6.9 10*3/uL (ref 4.0–10.5)

## 2018-04-12 LAB — URINALYSIS, ROUTINE W REFLEX MICROSCOPIC
BILIRUBIN URINE: NEGATIVE
Bacteria, UA: NONE SEEN
Glucose, UA: NEGATIVE mg/dL
HGB URINE DIPSTICK: NEGATIVE
Ketones, ur: 20 mg/dL — AB
LEUKOCYTES UA: NEGATIVE
NITRITE: NEGATIVE
PH: 6 (ref 5.0–8.0)
Protein, ur: 100 mg/dL — AB
SPECIFIC GRAVITY, URINE: 1.018 (ref 1.005–1.030)

## 2018-04-12 LAB — ACETAMINOPHEN LEVEL

## 2018-04-12 LAB — LIPASE, BLOOD: LIPASE: 29 U/L (ref 11–51)

## 2018-04-12 LAB — ETHANOL: Alcohol, Ethyl (B): <10 mg/dL (ref <10)

## 2018-04-12 LAB — SALICYLATE LEVEL

## 2018-04-12 MED ORDER — ALUM & MAG HYDROXIDE-SIMETH 200-200-20 MG/5ML PO SUSP
30.0000 mL | Freq: Once | ORAL | Status: AC
  Administered 2018-04-12: 30 mL via ORAL
  Filled 2018-04-12: qty 30

## 2018-04-12 NOTE — ED Notes (Signed)
Pt given sprite, crackers and peanut butter 

## 2018-04-12 NOTE — ED Notes (Signed)
BEHAVIORAL HEALTH ROUNDING Patient sleeping: No. Patient alert and oriented: yes Behavior appropriate: Yes.  ; If no, describe:  Nutrition and fluids offered: yes Toileting and hygiene offered: Yes  Sitter present: q15 minute observations and security  monitoring Law enforcement present: Yes  ODS  

## 2018-04-12 NOTE — ED Provider Notes (Signed)
-----------------------------------------   12:15 PM on 04/12/2018 -----------------------------------------  Patient seen by Dr. Tona Sensing who knows him well.  Dr. Tona Sensing feels he is safe to go back to the group home.  We will discharge him.  He is feeling better .   Arnaldo Natal, MD 04/12/18 [phone]
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
## 2018-04-12 NOTE — ED Notes (Signed)
MD Clapacs is currently consulting with him   

## 2018-04-12 NOTE — ED Notes (Signed)
Pt given two bags of belongings and has changed for discharge. Pt coat, belt and lighter are at RN discharge desk for pt after he leaves the quad. Group home is in lobby waiting on pt.

## 2018-04-12 NOTE — ED Triage Notes (Signed)
Pt in via EMS. Pt reports he takes a lot of medications and yesterday he drank a lot of alcohol and now he feels nauseated. Pt states, "My life is getting to a point where it is a stand still". Pt reports he thinks about what it would be like if he died often.

## 2018-04-12 NOTE — BH Assessment (Signed)
Writer spoke with Psych MD (Dr. Toni Amend) about patient and presenting problem. Psych MD will see him for disposition.

## 2018-04-12 NOTE — ED Notes (Signed)

## 2018-04-12 NOTE — ED Provider Notes (Signed)
[facility] Emergency Department Provider Note  ____________________________________________   First MD Initiated Contact with Patient 04/12/18 [phone]     (approximate)  I have reviewed the triage vital signs and the nursing notes.   HISTORY  Chief Complaint Emesis  Level 5 caveat:  history/ROS limited by acute intoxication and/or chronic psychiatric illness  HPI Lance [name] is a 42 y.o. male who presents by EMS for evaluation of nausea in the setting of taking a large amount of alcohol tonight.  He states that he drinks alcohol because he has been getting increasingly depressed over the last few weeks.  He describes the symptoms as severe and he has "been thinking about death a lot".  He states "I am suicidal" and thinks that if he was going to kill himself he would do so by taking an overdose of his medication.  He reports that his symptoms are all severe including the "upset stomach" and nausea.  He denies any pain.  He denies chest pain, shortness of breath, vomiting, abdominal pain except for some epigastric discomfort, and dysuria.  Nothing in particular makes his symptoms better or worse.  Past Medical History:  Diagnosis Date  . Depression   . Diabetes mellitus without complication (HCC)   . Hyperlipidemia   . Hypertension   . Lupus anticoagulant disorder (HCC) 06/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  . PE (pulmonary thromboembolism) (HCC)   . Schizo affective schizophrenia (HCC)   . Supratherapeutic INR 11/19/2016    Patient Active Problem List   Diagnosis Date Noted  . Schizophrenia (HCC) 10/15/2017  . 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 Tobacco use disorder 09/01/2016  . Schizoaffective disorder (HCC) 05/22/2016  . Diabetes mellitus without complication (HCC) 05/21/2016  . Hyperlipidemia 05/21/2016  . History of pulmonary embolism 05/21/2016  . Chronic anticoagulation 05/21/2016  . Hypertension 09/25/2015     History reviewed. No pertinent surgical history.  Prior to Admission medications   Medication Sig Start Date End Date Taking? Authorizing Provider  albuterol (PROVENTIL HFA;VENTOLIN HFA) 108 (90 Base) MCG/ACT inhaler Inhale 1-2 puffs into the lungs every 4 (four) hours as needed for wheezing or shortness of breath. 12/31/16   Jimmy Footman, MD  cloZAPine (CLOZARIL) 100 MG tablet Take 5 tablets (500 mg total) by mouth at bedtime. 12/03/16   Jimmy Footman, MD  fluticasone furoate-vilanterol (BREO ELLIPTA) 200-25 MCG/INH AEPB Inhale 1 puff into the lungs daily.    [provider]  Fluticasone-Salmeterol (ADVAIR) 250-50 MCG/DOSE AEPB Inhale 1 puff into the lungs 2 (two) times daily.    [provider]  insulin detemir (LEVEMIR) 100 UNIT/ML injection Inject 14 Units into the skin every morning.    [provider]  lisinopril (PRINIVIL,ZESTRIL) 2.5 MG tablet Take 2.5 mg by mouth daily.    [provider]  lithium carbonate (ESKALITH) 450 MG CR tablet Take 1 tablet (450 mg total) by mouth every 12 (twelve) hours. 09/14/16   Jimmy Footman, MD  metFORMIN (GLUCOPHAGE) 1000 MG tablet Take 1,000 mg by mouth 2 (two) times daily with a meal.    [provider]  nicotine (NICODERM CQ - DOSED IN MG/24 HOURS) 21 mg/24hr patch Place 21 mg onto the skin daily.    [provider]  Omega-3 Fatty Acids (FISH OIL) 1000 MG CAPS Take 1,000 mg by mouth daily.    [provider]  polyethylene glycol (MIRALAX / GLYCOLAX) packet Take 17 g by mouth daily. 09/15/16   Hernandez-Gonzalez,  Sue Lush, MD  sertraline (ZOLOFT) 50 MG tablet Take 1 tablet (50 mg total) by mouth daily. 10/22/17   McNew, Ileene Hutchinson, MD  simvastatin (ZOCOR) 40 MG tablet Take 40 mg by mouth at bedtime.    [provider]  traZODone (DESYREL) 100 MG tablet Take 100 mg by mouth at bedtime.     [provider]  warfarin (COUMADIN) 5 MG tablet Take 5  mg by mouth daily.     [provider]    Allergies Penicillins  Family History  Problem Relation Age of Onset  . CAD Mother   . CAD Sister     Social History Social History   Tobacco Use  . Smoking status: Current Every Day Smoker    Packs/day: 1.00    Years: 23.00    Pack years: 23.00    Types: Cigarettes  . Smokeless tobacco: Never Used  . Tobacco comment: will provide material  Substance Use Topics  . Alcohol use: No    Comment: occassionally  . Drug use: Yes    Types: Marijuana    Comment: Last use 11/29/16    Review of Systems Constitutional: No fever/chills Eyes: No visual changes. ENT: No sore throat. Cardiovascular: Denies chest pain. Respiratory: Denies shortness of breath. Gastrointestinal: Upset stomach with nausea as described above.  No vomiting. Genitourinary: Negative for dysuria. Musculoskeletal: Negative for neck pain.  Negative for back pain. Integumentary: Negative for rash. Neurological: Negative for headaches, focal weakness or numbness. Psychiatric:Depression and suicidal ideation, recent heavy alcohol intake  ____________________________________________   PHYSICAL EXAM:  VITAL SIGNS: ED Triage Vitals  Enc Vitals Group     BP 04/12/18 0602 (!) 127/99     Pulse Rate 04/12/18 0602 83     Resp 04/12/18 0602 20     Temp 04/12/18 0602 98.2 F (36.8 C)     Temp Source 04/12/18 0602 Oral     SpO2 04/12/18 0602 97 %     Weight 04/12/18 0603 92.5 kg (204 lb)     Height 04/12/18 0603 1.778 m (5\' 10" )     Head Circumference --      Peak Flow --      Pain Score 04/12/18 0603 0     Pain Loc --      Pain Edu? --      Excl. in GC? --     Constitutional: Alert and oriented.  Disheveled but generally well appearing and in no acute distress. Eyes: Conjunctivae are normal.  Head: Atraumatic. Nose: No congestion/rhinnorhea. Mouth/Throat: Mucous membranes are moist. Neck: No stridor.  No meningeal signs.   Cardiovascular: Normal  rate, regular rhythm. Good peripheral circulation. Grossly normal heart sounds. Respiratory: Normal respiratory effort.  No retractions. Lungs CTAB. Gastrointestinal: Soft and nontender. No distention.  Musculoskeletal: No lower extremity tenderness nor edema. No gross deformities of extremities. Neurologic:  Normal speech and language. No gross focal neurologic deficits are appreciated. He is ambulatory without any unsteadiness of gait and no signs of clinical intoxication. Skin:  Skin is warm, dry and intact. No rash noted. Psychiatric: Mood and affect are generally normal.  He is conversational, calm and cooperative, but endorses depression and suicidal ideation using those exact words.  ____________________________________________   LABS (all labs ordered are listed, but only abnormal results are displayed)  Labs Reviewed  CBC  COMPREHENSIVE METABOLIC PANEL  ETHANOL  SALICYLATE LEVEL  ACETAMINOPHEN LEVEL  LIPASE, BLOOD  URINALYSIS, ROUTINE W REFLEX MICROSCOPIC  URINE DRUG SCREEN, QUALITATIVE ([facility] ONLY)  ____________________________________________  EKG  None - EKG not ordered by ED physician ____________________________________________  RADIOLOGY   ED MD interpretation: No indication for imaging  Official radiology report(s): No results found.  ____________________________________________   PROCEDURES  Critical Care performed: No   Procedure(s) performed:   Procedures   ____________________________________________   INITIAL IMPRESSION / ASSESSMENT AND PLAN / ED COURSE  As part of my medical decision making, I reviewed the following data within the electronic MEDICAL RECORD NUMBER Nursing notes reviewed and incorporated, Labs reviewed , Old EKG reviewed, Old chart reviewed, A consult was requested and obtained from this/these consultant(s) Psychiatry and Notes from prior ED visits    Differential diagnosis includes, but is not limited to, schizophrenia, mood  disorder, adjustment disorder, substance-induced mood disorder, polysubstance abuse or at least alcohol abuse.  He is in no distress at this time and ambulating without any difficulty with no signs of clinical intoxication.  However he reports heavy alcohol use within the last 24 hours.  I review of the medical record also indicates that he was admitted to behavioral medicine within the last 2 to 3 months and has a well-documented history of schizophrenia.  Given that he is telling me he is having suicidal ideation in the setting of worsening depression and feeling like his life has no hope, I have placed him under involuntary commitment until he can be evaluated by psychiatry and hopefully cleared.  His lab work is still pending although his CBC is back and within normal limits.  He is in no acute medical distress and was able to tolerate Maalox and now is asking about food so I have ordered a diabetic diet for him.       ____________________________________________  FINAL CLINICAL IMPRESSION(S) / ED DIAGNOSES  Final diagnoses:  Schizophrenia, unspecified type (HCC)  Depression, unspecified depression type  Alcoholic intoxication with complication (HCC)  Nausea     MEDICATIONS GIVEN DURING THIS VISIT:  Medications  alum & mag hydroxide-simeth (MAALOX/MYLANTA) 200-200-20 MG/5ML suspension 30 mL (has no administration in time range)     ED Discharge Orders    None       Note:  This document was prepared using Dragon voice recognition software and may include unintentional dictation errors.    Loleta Rose, MD 04/12/18 [phone]

## 2018-04-12 NOTE — Consult Note (Signed)
[facility] Consult   Reason for Consult: Consult for 42 year old man with schizophrenia came to the hospital claiming that he had some suicidal thoughts Referring Physician: Rip Harbour Patient Identification: Kashaun Bebo MRN:  [id number] Principal Diagnosis: Schizoaffective disorder [facility]) Diagnosis:   Patient Active Problem List   Diagnosis Date Noted  . Alcohol abuse [F10.10] 04/12/2018  . Schizophrenia ([place]) [F20.9] 10/15/2017  . 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 Tobacco use disorder [F17.200] 09/01/2016  . Schizoaffective disorder ([place]) [F25.9] 05/22/2016  . Diabetes mellitus without complication ([place]) [S96.2] 05/21/2016  . Hyperlipidemia [E78.5] 05/21/2016  . History of pulmonary embolism [Z86.711] 05/21/2016  . Chronic anticoagulation [Z79.01] 05/21/2016  . Hypertension [I10] 09/25/2015    Total Time spent with patient: 1 hour  Subjective:   Kaysan Peixoto is a 42 y.o. male patient admitted with "I just need to get my head together".  HPI: Patient seen chart reviewed.  Patient well known from many prior encounters.  42 year old man with schizophrenia went to the police station today to tell them that he had been drinking and that he was having suicidal thoughts and wanted to come to the hospital.  On interview today the patient was awake alert and cooperative.  He claims that he has been drinking 2 or 340 ounce beers every day for the last several days.  Not sure how reliable this is since his alcohol level was 0 on presentation.  Patient says that his mood has been more down.  He talks at length about how boring and depressing it is living at his group home where they have nothing to do and he just has to listen to other people's drama all the time.  Sleep is okay.  Appetite is okay.  He does talk about having visual hallucinations intermittently which is a chronic thing for him nothing new.  He mentions having suicidal thoughts but does  not have any specific plan or intention to do so.  No homicidal ideation.  He is compliant with medication.  Social history: Patient has a legal guardian.  He resides in a group home.  He does have an act team.  Does not have much social activity outside of his every day routine.  Medical history: Multiple medical problems including high blood pressure diabetes a history of pulmonary embolism that requires him to be on chronic anticoagulation  Substance abuse history: Typically he is not a major substance abuser.  He said times in the past where he was smoking marijuana.  Alcohol abuse is not really a major regular problem for his.  No history of DTs or seizures.  Past Psychiatric History: Patient has a well-known history of schizophrenia.  Has had many hospitalizations many visits to the emergency room.  He does have a history of self injury and has had suicide attempts in the past.  He is currently stable with an act team in terms of his medicine but even on clozapine and mood stabilizers continues to occasionally have hallucinations.  Risk to Self: Suicidal Ideation: Yes-Currently Present Suicidal Intent: No Is patient at risk for suicide?: Yes Suicidal Plan?: Yes-Currently Present Specify Current Suicidal Plan: Overdose on medication or hang his self Access to Means: Yes Specify Access to Suicidal Means: Medication What has been your use of drugs/alcohol within the last 12 months?: Alcohol How many times?: 0 Other Self Harm Risks: Reports of none Triggers for Past Attempts: Other personal contacts, Hallucinations Intentional Self Injurious Behavior: None Risk to Others: Homicidal Ideation: No  Thoughts of Harm to Others: No Current Homicidal Intent: No Current Homicidal Plan: No Access to Homicidal Means: No Identified Victim: Reports of none History of harm to others?: No Assessment of Violence: None Noted Violent Behavior Description: Reports of none Does patient have access to  weapons?: No Criminal Charges Pending?: No Does patient have a court date: No Prior Inpatient Therapy: Prior Inpatient Therapy: Yes Prior Therapy Facilty/Provider(s): 01/2018, 10/2017, 09/2017, 02/2017, 12/2016, 11/2016, 08/2016 Reason for Treatment: [place] Prior Outpatient Therapy: Prior Outpatient Therapy: Yes Prior Therapy Dates: Current Prior Therapy Facilty/Provider(s): [facility] Reason for Treatment: Schizoaffective  Does patient have an ACCT team?: Yes Does patient have [facility] services? : No Does patient have P4[facility] services?: No  Past Medical History:  Past Medical History:  Diagnosis Date  . Depression   . Diabetes mellitus without complication ([place])   . Hyperlipidemia   . Hypertension   . Lupus anticoagulant disorder ([place]) 06/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  . PE (pulmonary thromboembolism) ([place])   . Schizo affective schizophrenia ([place])   . Supratherapeutic INR 11/19/2016   History reviewed. No pertinent surgical history. Family History:  Family History  Problem Relation Age of Onset  . CAD Mother   . CAD Sister    Family Psychiatric  History: None identified Social History:  Social History   Substance and Sexual Activity  Alcohol Use No   Comment: occassionally     Social History   Substance and Sexual Activity  Drug Use Yes  . Types: Marijuana   Comment: Last use 11/29/16    Social History   Socioeconomic History  . Marital status: Single    Spouse name: Not on file  . Number of children: Not on file  . Years of education: Not on file  . Highest education level: Not on file  Occupational History  . Not on file  Social Needs  . Financial resource strain: Not on file  . Food insecurity:    Worry: Not on file    Inability: Not on file  . Transportation needs:    Medical: Not on file    Non-medical: Not on file  Tobacco Use  . Smoking status: Current Every Day Smoker    Packs/day: 1.00    Years: 23.00    Pack  years: 23.00    Types: Cigarettes  . Smokeless tobacco: Never Used  . Tobacco comment: will provide material  Substance and Sexual Activity  . Alcohol use: No    Comment: occassionally  . Drug use: Yes    Types: Marijuana    Comment: Last use 11/29/16  . Sexual activity: Never    Birth control/protection: None    Comment: occasional marijuana- none recently  Lifestyle  . Physical activity:    Days per week: Not on file    Minutes per session: Not on file  . Stress: Not on file  Relationships  . Social connections:    Talks on phone: Not on file    Gets together: Not on file    Attends religious service: Not on file    Active member of club or organization: Not on file    Attends meetings of clubs or organizations: Not on file    Relationship status: Not on file  Other Topics Concern  . Not on file  Social History Narrative   From a group home in [place]   Additional Social History:    Allergies:   Allergies  Allergen Reactions  . Penicillins Other (See Comments)    Reaction: "lockjaw" Has patient had a PCN reaction causing immediate rash, facial/tongue/throat swelling, SOB or lightheadedness with hypotension: No Has patient had a PCN reaction causing severe rash involving mucus membranes or skin necrosis: No Has patient had a PCN reaction that required hospitalization: No Has patient had a PCN reaction occurring within the last 10 years: No If all of the above answers are "NO", then may proceed with Cephalosporin use.     Labs:  Results for orders placed or performed during the hospital encounter of 04/12/18 (from the past 48 hour(s))  Comprehensive metabolic panel     Status: Abnormal   Collection Time: 04/12/18  6:13 AM  Result Value Ref Range   Sodium 144 135 - 145 mmol/L   Potassium 3.9 3.5 - 5.1 mmol/L   Chloride 107 98 - 111 mmol/L   CO2 25 22 - 32 mmol/L   Glucose, Bld 130 (H) 70 - 99 mg/dL   BUN 14 6 - 20 mg/dL   Creatinine, Ser 0.85 0.61 - 1.24  mg/dL   Calcium 9.9 8.9 - 10.3 mg/dL   Total Protein 7.5 6.5 - 8.1 g/dL   Albumin 4.9 3.5 - 5.0 g/dL   AST 17 15 - 41 U/L   ALT 17 0 - 44 U/L   Alkaline Phosphatase 50 38 - 126 U/L   Total Bilirubin 1.1 0.3 - 1.2 mg/dL   GFR calc non Af Amer >60 >60 mL/min   GFR calc Af Amer >60 >60 mL/min    Comment: (NOTE) The eGFR has been calculated using the CKD EPI equation. This calculation has not been validated in all clinical situations. eGFR's persistently <60 mL/min signify possible Chronic Kidney Disease.    Anion gap 12 5 - 15    Comment: Performed at [facility], [address] [place], [place] [address]  Ethanol     Status: None   Collection Time: 04/12/18  6:13 AM  Result Value Ref Range   Alcohol, Ethyl (B) <10 <10 mg/dL    Comment: (NOTE) Lowest detectable limit for serum alcohol is 10 mg/dL. For medical purposes only. Performed at [facility], [address] [place], [place] [address]   Salicylate level     Status: None   Collection Time: 04/12/18  6:13 AM  Result Value Ref Range   Salicylate Lvl <1.7 2.8 - 30.0 mg/dL    Comment: Performed at [facility], [address] [place], [place] [address]  Acetaminophen level     Status: Abnormal   Collection Time: 04/12/18  6:13 AM  Result Value Ref Range   Acetaminophen (Tylenol), Serum <10 (L) 10 - 30 ug/mL    Comment: (NOTE) Therapeutic concentrations vary significantly. A range of 10-30 ug/mL  may be an effective concentration for many patients. However, some  are best treated at concentrations outside of this range. Acetaminophen concentrations >150 ug/mL at 4 hours after ingestion  and >50 ug/mL at 12 hours after ingestion are often associated with  toxic reactions. Performed at [facility], [address] [place], [place] [address]   cbc     Status: None   Collection Time: 04/12/18  6:13 AM  Result Value Ref Range   WBC 6.9 4.0 - 10.5 K/uL   RBC 4.99 4.22 - 5.81  MIL/uL   Hemoglobin 15.7 13.0 - 17.0 g/dL   HCT 46.2 39.0 - 52.0 %   MCV 92.6 80.0 - 100.0  fL   MCH 31.5 26.0 - 34.0 pg   MCHC 34.0 30.0 - 36.0 g/dL   RDW 12.7 11.5 - 15.5 %   Platelets 260 150 - 400 K/uL   nRBC 0.0 0.0 - 0.2 %    Comment: Performed at [facility], [address] [place], [place] [address]  Lipase, blood     Status: None   Collection Time: 04/12/18  6:13 AM  Result Value Ref Range   Lipase 29 11 - 51 U/L    Comment: Performed at [facility], [address] [place], [place] [address]  Urinalysis, Routine w reflex microscopic     Status: Abnormal   Collection Time: 04/12/18  6:13 AM  Result Value Ref Range   Color, Urine YELLOW (A) YELLOW   APPearance CLEAR (A) CLEAR   Specific Gravity, Urine 1.018 1.005 - 1.030   pH 6.0 5.0 - 8.0   Glucose, UA NEGATIVE NEGATIVE mg/dL   Hgb urine dipstick NEGATIVE NEGATIVE   Bilirubin Urine NEGATIVE NEGATIVE   Ketones, ur 20 (A) NEGATIVE mg/dL   Protein, ur 100 (A) NEGATIVE mg/dL   Nitrite NEGATIVE NEGATIVE   Leukocytes, UA NEGATIVE NEGATIVE   RBC / HPF 6-10 0 - 5 RBC/hpf   WBC, UA 0-5 0 - 5 WBC/hpf   Bacteria, UA NONE SEEN NONE SEEN   Squamous Epithelial / LPF 0-5 0 - 5   Mucus PRESENT     Comment: Performed at [facility], [address], [place], [place] [address]  Urine Drug Screen, Qualitative     Status: None   Collection Time: 04/12/18  6:13 AM  Result Value Ref Range   Tricyclic, Ur Screen NONE DETECTED NONE DETECTED   Amphetamines, Ur Screen NONE DETECTED NONE DETECTED   MDMA (Ecstasy)Ur Screen NONE DETECTED NONE DETECTED   Cocaine Metabolite,Ur [place] NONE DETECTED NONE DETECTED   Opiate, Ur Screen NONE DETECTED NONE DETECTED   Phencyclidine (PCP) Ur S NONE DETECTED NONE DETECTED   Cannabinoid 50 Ng, Ur [place] NONE DETECTED NONE DETECTED   Barbiturates, Ur Screen NONE DETECTED NONE DETECTED   Benzodiazepine, Ur Scrn NONE DETECTED NONE DETECTED   Methadone Scn, Ur NONE DETECTED NONE  DETECTED    Comment: (NOTE) Tricyclics + metabolites, urine    Cutoff 1000 ng/mL Amphetamines + metabolites, urine  Cutoff 1000 ng/mL MDMA (Ecstasy), urine              Cutoff 500 ng/mL Cocaine Metabolite, urine          Cutoff 300 ng/mL Opiate + metabolites, urine        Cutoff 300 ng/mL Phencyclidine (PCP), urine         Cutoff 25 ng/mL Cannabinoid, urine                 Cutoff 50 ng/mL Barbiturates + metabolites, urine  Cutoff 200 ng/mL Benzodiazepine, urine              Cutoff 200 ng/mL Methadone, urine                   Cutoff 300 ng/mL The urine drug screen provides only a preliminary, unconfirmed analytical test result and should not be used for non-medical purposes. Clinical consideration and professional judgment should be applied to any positive drug screen result due to possible interfering substances. A more specific alternate chemical method must be used in order to obtain a confirmed analytical result. Gas chromatography / mass spectrometry (GC/MS) is the preferred  confirmat ory method. Performed at [facility], [address] [place], [place] [address]     No current facility-administered medications for this encounter.    Current Outpatient Medications  Medication Sig Dispense Refill  . acetaminophen (TYLENOL) 500 MG tablet Take 500 mg by mouth every 6 (six) hours as needed for mild pain or headache.    Marland Kitchen acetaminophen (TYLENOL) 650 MG CR tablet Take 650 mg by mouth every 6 (six) hours as needed for pain.    Marland Kitchen albuterol (PROVENTIL HFA;VENTOLIN HFA) 108 (90 Base) MCG/ACT inhaler Inhale 1-2 puffs into the lungs every 4 (four) hours as needed for wheezing or shortness of breath. (Patient taking differently: Inhale 1 puff into the lungs 2 (two) times daily as needed for wheezing or shortness of breath. ) 1 Inhaler 0  . alum & mag hydroxide-simeth (MAALOX/MYLANTA) 200-200-20 MG/5ML suspension Take 30 mLs by mouth as needed for indigestion or heartburn.    .  chlorhexidine (PERIDEX) 0.12 % solution Use as directed 10 mLs in the mouth or throat 2 (two) times daily.    . cloZAPine (CLOZARIL) 100 MG tablet Take 5 tablets (500 mg total) by mouth at bedtime. 150 tablet 0  . Fluticasone-Salmeterol (ADVAIR) 250-50 MCG/DOSE AEPB Inhale 1 puff into the lungs 2 (two) times daily.    Marland Kitchen ibuprofen (ADVIL,MOTRIN) 600 MG tablet Take 600 mg by mouth every 6 (six) hours as needed (inflammation).    . insulin detemir (LEVEMIR) 100 UNIT/ML injection Inject 14 Units into the skin every morning.    Marland Kitchen lisinopril (PRINIVIL,ZESTRIL) 2.5 MG tablet Take 2.5 mg by mouth daily.    Marland Kitchen lithium carbonate (ESKALITH) 450 MG CR tablet Take 1 tablet (450 mg total) by mouth every 12 (twelve) hours. 60 tablet 0  . magnesium hydroxide (MILK OF MAGNESIA) 400 MG/5ML suspension Take 30 mLs by mouth daily as needed for mild constipation.    . metFORMIN (GLUCOPHAGE) 1000 MG tablet Take 1,000 mg by mouth 2 (two) times daily with a meal.    . nicotine (NICODERM CQ - DOSED IN MG/24 HOURS) 21 mg/24hr patch Place 21 mg onto the skin daily.    . Omega-3 Fatty Acids (FISH OIL) 1000 MG CAPS Take 1,000 mg by mouth daily.    . ondansetron (ZOFRAN) 4 MG tablet Take 4 mg by mouth every 8 (eight) hours as needed for nausea or vomiting.    . sertraline (ZOLOFT) 50 MG tablet Take 1 tablet (50 mg total) by mouth daily. 30 tablet 0  . simvastatin (ZOCOR) 40 MG tablet Take 40 mg by mouth at bedtime.    . traZODone (DESYREL) 100 MG tablet Take 100 mg by mouth at bedtime.     Marland Kitchen warfarin (COUMADIN) 5 MG tablet Take 5 mg by mouth daily.     . fluticasone furoate-vilanterol (BREO ELLIPTA) 200-25 MCG/INH AEPB Inhale 1 puff into the lungs daily.    . polyethylene glycol (MIRALAX / GLYCOLAX) packet Take 17 g by mouth daily. (Patient not taking: Reported on 04/12/2018) 30 each 0    Musculoskeletal: Strength & Muscle Tone: within normal limits Gait & Station: normal Patient leans: N/A  Psychiatric Specialty  Exam: Physical Exam  Nursing note and vitals reviewed. Constitutional: He appears well-developed and well-nourished.  HENT:  Head: Normocephalic and atraumatic.  Eyes: Pupils are equal, round, and reactive to light. Conjunctivae are normal.  Neck: Normal range of motion.  Cardiovascular: Regular rhythm and normal heart sounds.  Respiratory: Effort normal. No respiratory distress.  GI:  Soft.  Musculoskeletal: Normal range of motion.  Neurological: He is alert.  Skin: Skin is warm and dry.  Psychiatric: His speech is normal and behavior is normal. Thought content normal. His affect is blunt. Cognition and memory are normal. He expresses impulsivity. He exhibits a depressed mood.    Review of Systems  Constitutional: Negative.   HENT: Negative.   Eyes: Negative.   Respiratory: Negative.   Cardiovascular: Negative.   Gastrointestinal: Negative.   Musculoskeletal: Negative.   Skin: Negative.   Neurological: Negative.   Psychiatric/Behavioral: Positive for depression, hallucinations and substance abuse. Negative for memory loss and suicidal ideas. The patient is nervous/anxious. The patient does not have insomnia.     Blood pressure (!) 127/99, pulse 83, temperature 98.2 F (36.8 C), temperature source Oral, resp. rate 20, height '5\' 10"'  (1.778 m), weight 92.5 kg, SpO2 97 %.Body mass index is 29.27 kg/m.  General Appearance: Disheveled  Eye Contact:  Fair  Speech:  Slow  Volume:  Decreased  Mood:  Dysphoric  Affect:  Congruent  Thought Process:  Goal Directed  Orientation:  Full (Time, Place, and Person)  Thought Content:  Logical and Hallucinations: Visual  Suicidal Thoughts:  Yes.  without intent/plan  Homicidal Thoughts:  No  Memory:  Immediate;   Fair Recent;   Fair Remote;   Fair  Judgement:  Fair  Insight:  Fair  Psychomotor Activity:  Decreased  Concentration:  Concentration: Fair  Recall:  [employer] of Knowledge:  Fair  Language:  Fair  Akathisia:  No  Handed:   Right  AIMS (if indicated):     Assets:  Desire for Improvement Housing Social Support  ADL's:  Intact  Cognition:  WNL  Sleep:        Treatment Plan Summary: Medication management and Plan This is a 42 year old man with schizophrenia who is well-known to the psychiatric service.  Periodically he will just get fed up with how boring and hopeless his situation is and his mood will take a downturn.  Coming into the hospital I think is 1 of his ways to just get away from his regular routine.  I spent some time with him listening to him and expressing empathy and encouragement.  However I do not think he has any current intent to harm himself and I do not think he really requires inpatient hospitalization at this time.  I told him that and he eventually agreed that that was probably correct although he would certainly prefer to be in the hospital.  No change to medicine.  Discontinue IVC.  Case reviewed with TTS and emergency room doctor.  Disposition: No evidence of imminent risk to self or others at present.   Patient does not meet criteria for psychiatric inpatient admission. Supportive therapy provided about ongoing stressors. Discussed crisis plan, support from social network, calling 911, coming to the Emergency Department, and calling Suicide Hotline.  Alethia Berthold, MD 04/12/2018 12:41 PM

## 2018-04-12 NOTE — Discharge Instructions (Signed)
Please be careful how much alcohol you drink it can cause a lot of belly pain sometimes and make you sick.  Please return here for any further problems.  Please follow-up with your doctors.

## 2018-04-12 NOTE — ED Notes (Signed)
ED  Is the patient under IVC or is there intent for IVC: Yes.   Is the patient medically cleared: Yes.   Is there vacancy in the ED BHU: Yes.   Is the population mix appropriate for patient: Yes.   Is the patient awaiting placement in inpatient or outpatient setting:   Has the patient had a psychiatric consult:  Consult pending  Survey of unit performed for contraband, proper placement and condition of furniture, tampering with fixtures in bathroom, shower, and each patient room: Yes.  ; Findings:  APPEARANCE/BEHAVIOR Calm and cooperative NEURO ASSESSMENT Orientation: oriented x3  Denies pain Hallucinations: No.None noted (Hallucinations)  Denies at present  Speech: Normal - slow to respond at times  Gait: normal RESPIRATORY ASSESSMENT Even  Unlabored respirations  CARDIOVASCULAR ASSESSMENT Pulses equal   regular rate  Skin warm and dry   GASTROINTESTINAL ASSESSMENT no GI complaint EXTREMITIES Full ROM  PLAN OF CARE Provide calm/safe environment. Vital signs assessed twice daily. ED BHU Assessment once each 12-hour shift. Collaborate with TTS daily or as condition indicates. Assure the ED provider has rounded once each shift. Provide and encourage hygiene. Provide redirection as needed. Assess for escalating behavior; address immediately and inform ED provider.  Assess family dynamic and appropriateness for visitation as needed: Yes.  ; If necessary, describe findings:  Educate the patient/family about BHU procedures/visitation: Yes.  ; If necessary, describe findings:

## 2018-04-12 NOTE — ED Notes (Signed)
Patient alert and oriented. Patient states having SI thoughts off and on, having HI thoughts toward random people and hears and sees hallucinations. Patient contracts for safety with this Clinical research associate.

## 2018-04-12 NOTE — ED Notes (Signed)
Patient ambulatory to waiting room via EMS from police station.  Patient with complaint of emesis and nausea.  Reported to EMS that he had been drinking beer all day.  EMS vital signs:  BP - 127/68, pulse oix 95% on room air, HR -  95.

## 2018-05-05 ENCOUNTER — Encounter: Payer: Self-pay | Admitting: Podiatry

## 2018-05-05 ENCOUNTER — Ambulatory Visit (INDEPENDENT_AMBULATORY_CARE_PROVIDER_SITE_OTHER): Payer: Medicare Other | Admitting: Podiatry

## 2018-05-05 ENCOUNTER — Ambulatory Visit: Payer: Medicare Other | Admitting: Podiatry

## 2018-05-05 DIAGNOSIS — B351 Tinea unguium: Secondary | ICD-10-CM | POA: Diagnosis not present

## 2018-05-05 DIAGNOSIS — M79676 Pain in unspecified toe(s): Secondary | ICD-10-CM

## 2018-05-05 DIAGNOSIS — E119 Type 2 diabetes mellitus without complications: Secondary | ICD-10-CM

## 2018-05-05 MED ORDER — AMMONIUM LACTATE 12 % EX CREA: TOPICAL_CREAM | CUTANEOUS | 0 refills | Status: DC | PRN

## 2018-05-05 NOTE — Addendum Note (Signed)
Addended byMaury Dus: Daphnee Preiss L on: 05/05/2018 03:02 PM   Modules accepted: Orders

## 2018-05-05 NOTE — Progress Notes (Addendum)
Complaint:  Visit Type: Patient returns to my office for continued preventative foot care services. Complaint: Patient states" my nails have grown long and thick and become painful to walk and wear shoes" Patient has been diagnosed with DM with no foot complications. The patient presents for preventative foot care services. No changes to ROS.  Patient also says he has painful callus.  Podiatric Exam: Vascular: dorsalis pedis and posterior tibial pulses are palpable bilateral. Capillary return is immediate. Temperature gradient is WNL. Skin turgor WNL  Sensorium: Normal Semmes Weinstein monofilament test. Normal tactile sensation bilaterally. Nail Exam: Pt has thick disfigured discolored nails with subungual debris noted bilateral entire nail second toenail  through fifth toenails Ulcer Exam: There is no evidence of ulcer or pre-ulcerative changes or infection. Orthopedic Exam: Muscle tone and strength are WNL. No limitations in general ROM. No crepitus or effusions noted. Foot type and digits show no abnormalities. HAV  B/L. Skin: No Porokeratosis. No infection or ulcers. Asymptomatic   Pinch callus.  Sub 4 porokeratosis right foot asymptomatic.  Diagnosis:  Onychomycosis, , Pain in right toe, pain in left toes.   Treatment & Plan Procedures and Treatment: Consent by patient was obtained for treatment procedures. The patient understood the discussion of treatment and procedures well. All questions were answered thoroughly reviewed. Debridement of mycotic and hypertrophic toenails, 1 through 5 bilateral and clearing of subungual debris. No ulceration, no infection noted. Prescribe lac-hydrin. ABN signed for 2019. Return Visit-Office Procedure: Patient instructed to return to the office for a follow up visit 4 months for continued evaluation and treatment.    Helane GuntherGregory Stormey Wilborn DPM

## 2018-05-27 DIAGNOSIS — Z5181 Encounter for therapeutic drug level monitoring: Secondary | ICD-10-CM | POA: Diagnosis not present

## 2018-05-27 DIAGNOSIS — Z79899 Other long term (current) drug therapy: Secondary | ICD-10-CM | POA: Diagnosis not present

## 2018-07-20 DIAGNOSIS — F3131 Bipolar disorder, current episode depressed, mild: Secondary | ICD-10-CM | POA: Diagnosis not present

## 2018-07-20 DIAGNOSIS — E1141 Type 2 diabetes mellitus with diabetic mononeuropathy: Secondary | ICD-10-CM | POA: Diagnosis not present

## 2018-07-20 DIAGNOSIS — Z5181 Encounter for therapeutic drug level monitoring: Secondary | ICD-10-CM | POA: Diagnosis not present

## 2018-07-20 DIAGNOSIS — Z79899 Other long term (current) drug therapy: Secondary | ICD-10-CM | POA: Diagnosis not present

## 2018-07-20 DIAGNOSIS — E669 Obesity, unspecified: Secondary | ICD-10-CM | POA: Diagnosis not present

## 2018-07-20 DIAGNOSIS — I2782 Chronic pulmonary embolism: Secondary | ICD-10-CM | POA: Diagnosis not present

## 2018-07-21 IMAGING — CR DG CHEST 2V
2 series · 2 of 2 positions shown · non-contrast
Comparison: Radiographs May 21, 2016.

CLINICAL DATA: Shortness of breath.

EXAM:
CHEST  2 VIEW

[chest pa]
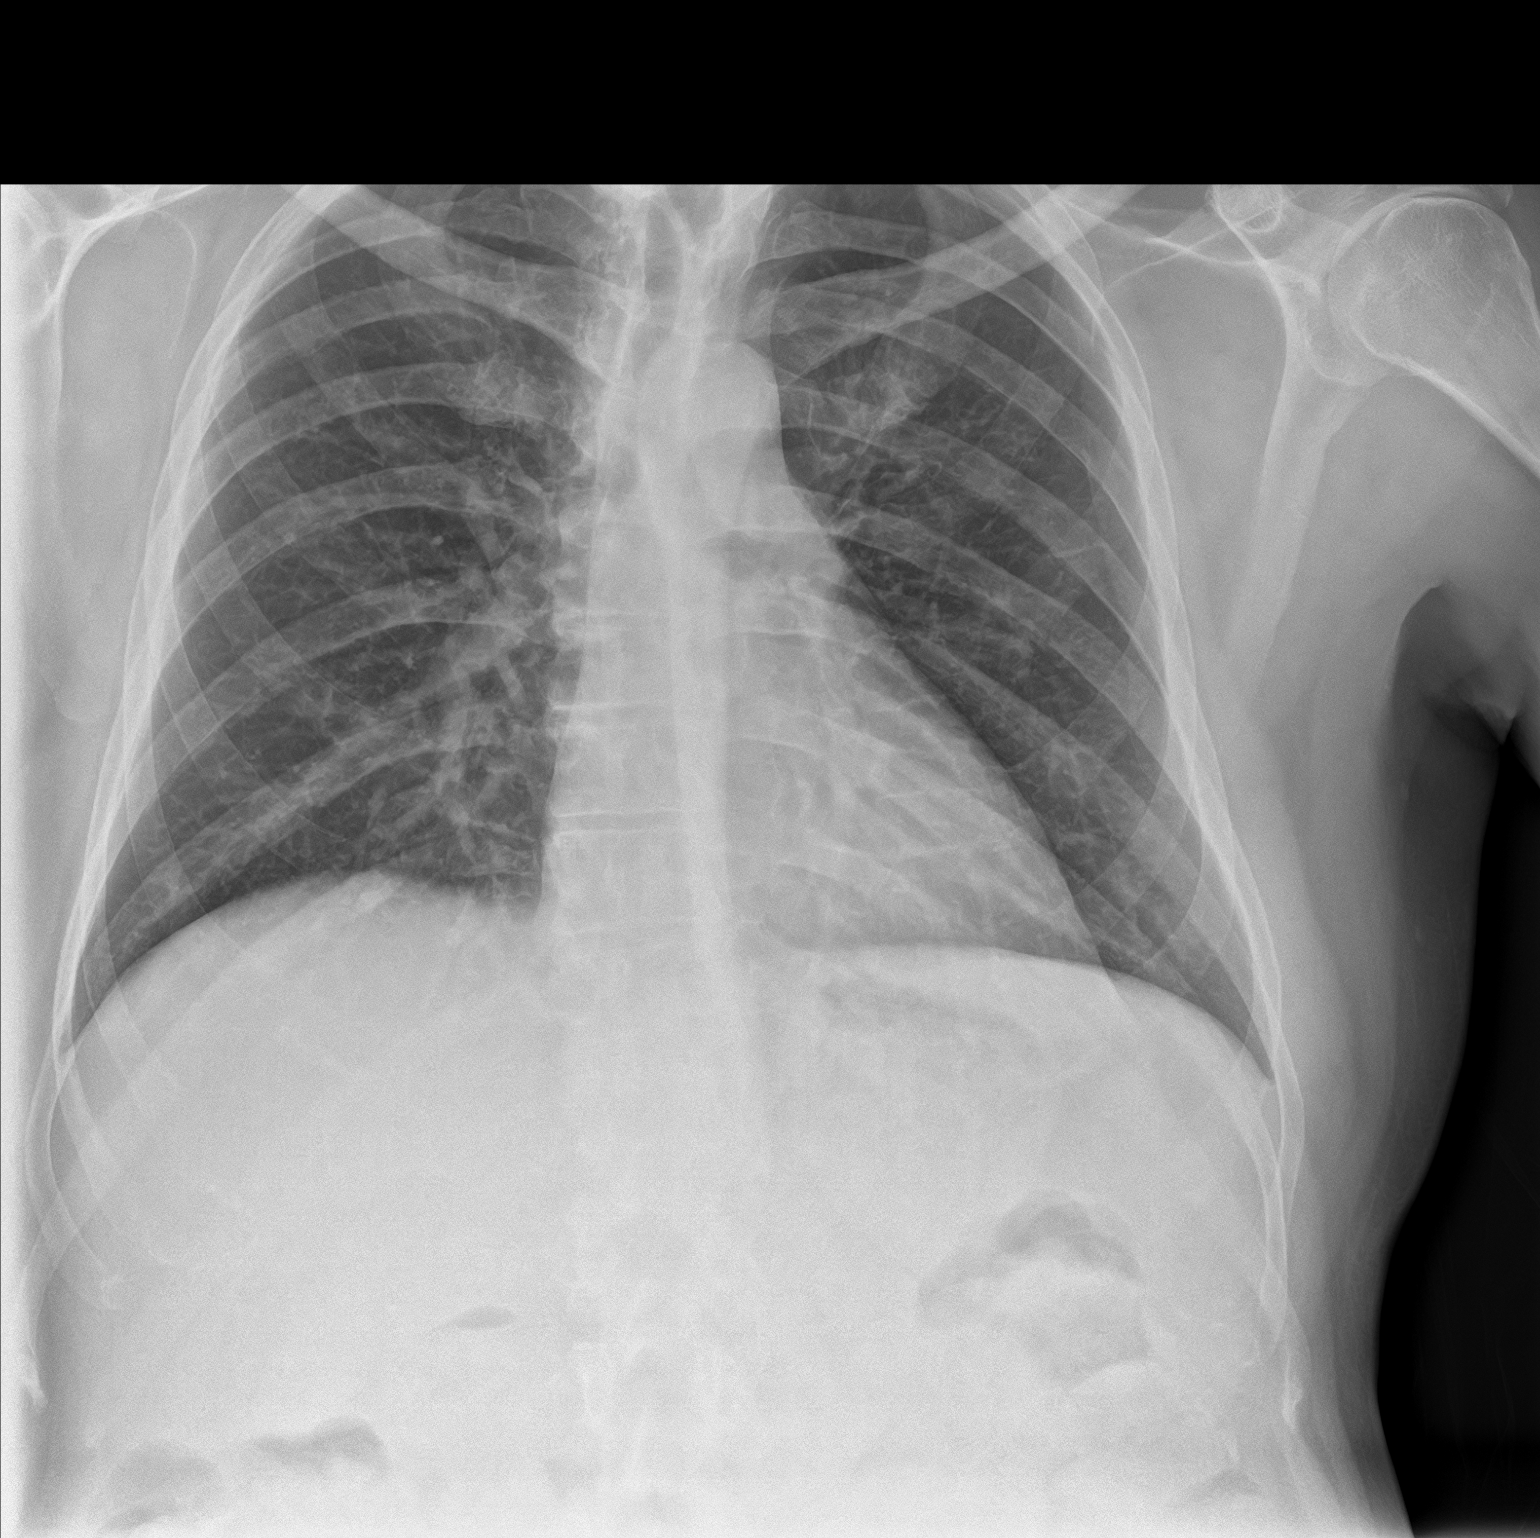

[chest lat]
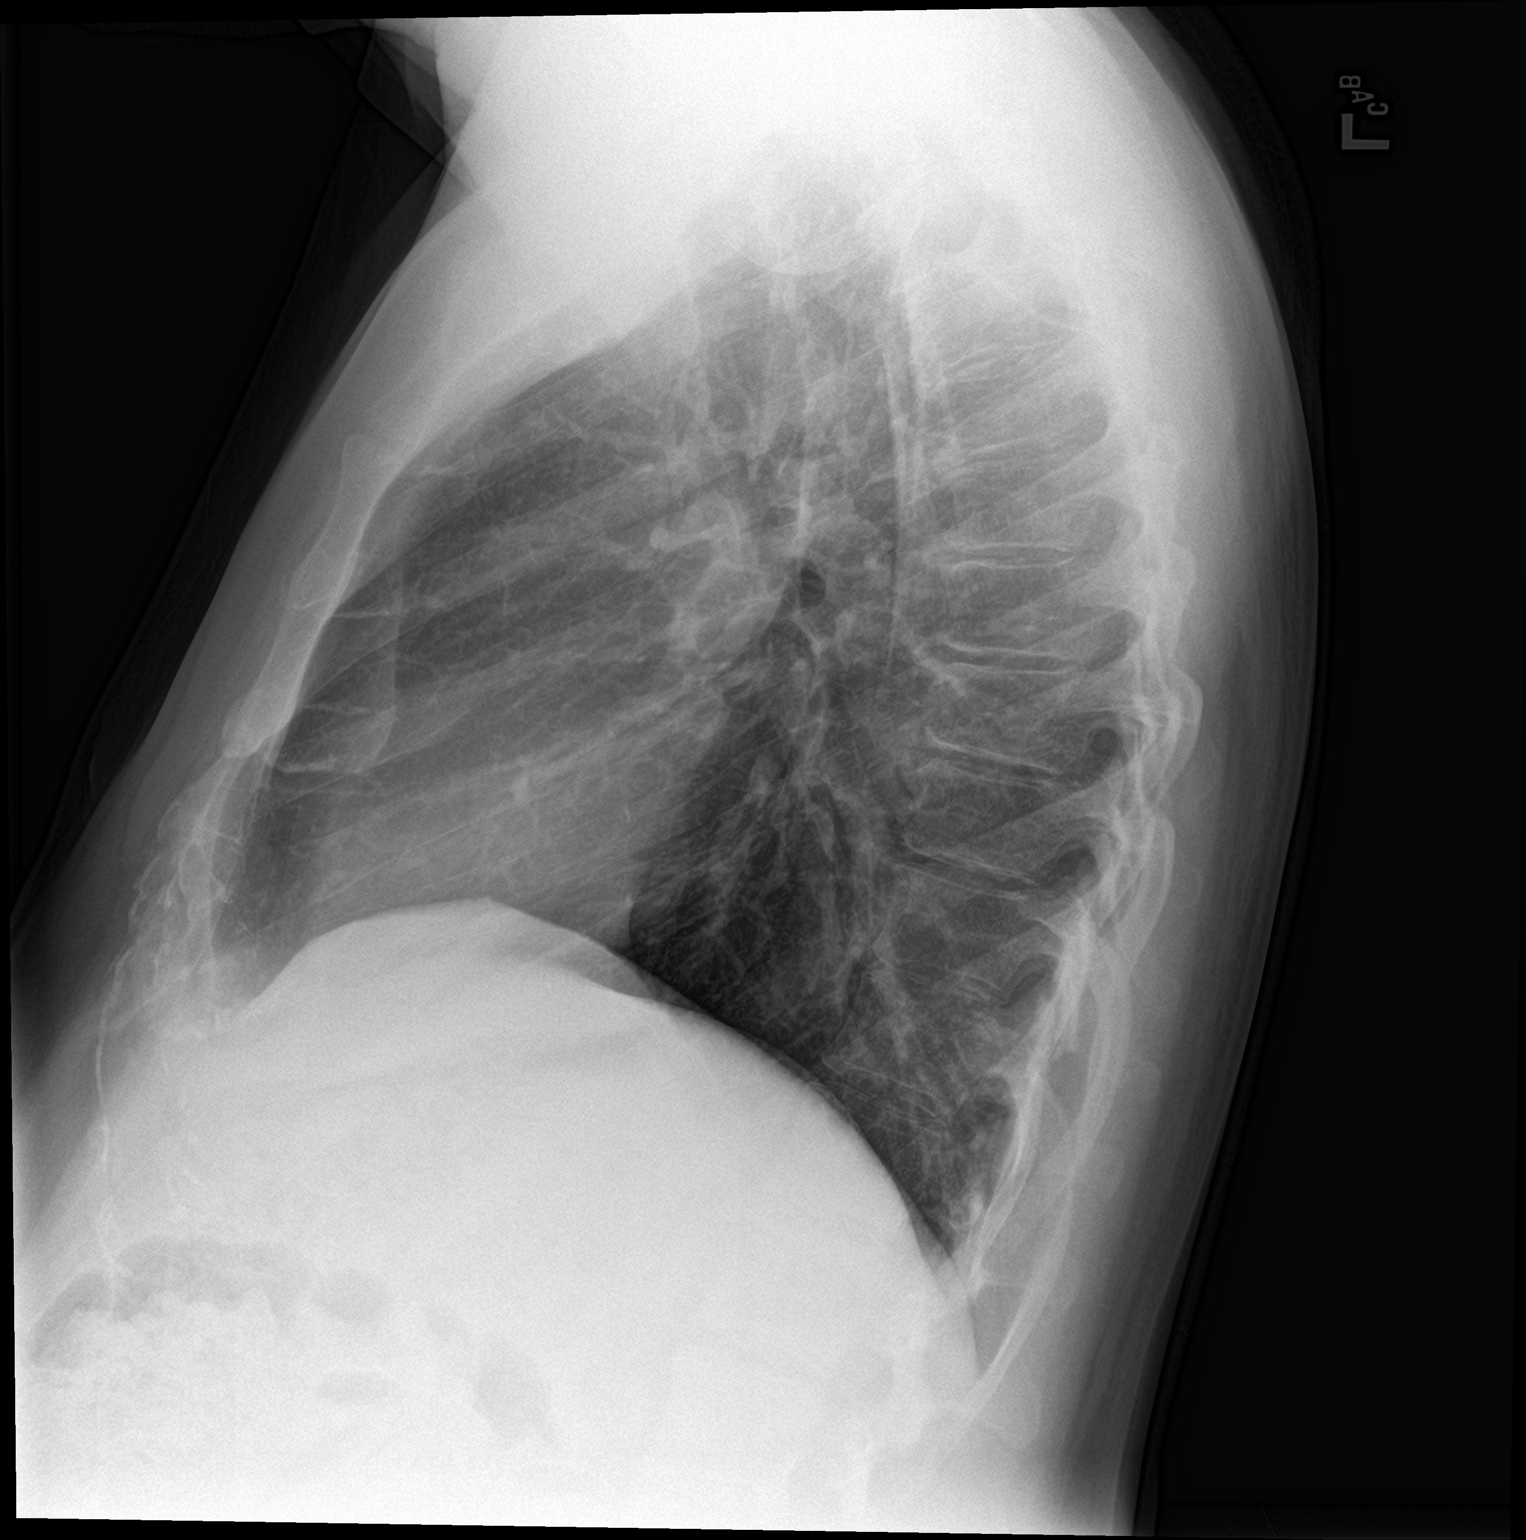

[2 of 2 positions shown; findings below may reference images not displayed]

FINDINGS: The heart size and mediastinal contours are within normal limits.
Both lungs are clear. No pneumothorax or pleural effusion is noted.
The visualized skeletal structures are unremarkable.
IMPRESSION: No active cardiopulmonary disease.

## 2018-08-18 DIAGNOSIS — I2699 Other pulmonary embolism without acute cor pulmonale: Secondary | ICD-10-CM | POA: Diagnosis not present

## 2018-08-18 DIAGNOSIS — Z8659 Personal history of other mental and behavioral disorders: Secondary | ICD-10-CM | POA: Diagnosis not present

## 2018-08-18 DIAGNOSIS — I10 Essential (primary) hypertension: Secondary | ICD-10-CM | POA: Diagnosis not present

## 2018-08-18 DIAGNOSIS — E669 Obesity, unspecified: Secondary | ICD-10-CM | POA: Diagnosis not present

## 2018-08-22 DIAGNOSIS — F250 Schizoaffective disorder, bipolar type: Secondary | ICD-10-CM | POA: Diagnosis not present

## 2018-09-05 ENCOUNTER — Other Ambulatory Visit: Payer: Self-pay

## 2018-09-05 ENCOUNTER — Ambulatory Visit (INDEPENDENT_AMBULATORY_CARE_PROVIDER_SITE_OTHER): Payer: Medicare Other | Admitting: Podiatry

## 2018-09-05 ENCOUNTER — Encounter: Payer: Self-pay | Admitting: Podiatry

## 2018-09-05 DIAGNOSIS — M79676 Pain in unspecified toe(s): Secondary | ICD-10-CM | POA: Diagnosis not present

## 2018-09-05 DIAGNOSIS — B351 Tinea unguium: Secondary | ICD-10-CM | POA: Diagnosis not present

## 2018-09-05 DIAGNOSIS — Q828 Other specified congenital malformations of skin: Secondary | ICD-10-CM

## 2018-09-05 DIAGNOSIS — Z5181 Encounter for therapeutic drug level monitoring: Secondary | ICD-10-CM | POA: Diagnosis not present

## 2018-09-05 DIAGNOSIS — E119 Type 2 diabetes mellitus without complications: Secondary | ICD-10-CM

## 2018-09-05 DIAGNOSIS — Z79899 Other long term (current) drug therapy: Secondary | ICD-10-CM | POA: Diagnosis not present

## 2018-09-05 NOTE — Progress Notes (Signed)
Complaint:  Visit Type: Patient returns to my office for continued preventative foot care services. Complaint: Patient states" my nails have grown long and thick and become painful to walk and wear shoes" Patient has been diagnosed with DM with no foot complications. The patient presents for preventative foot care services. No changes to ROS.  Patient also says he has painful callus.  Podiatric Exam: Vascular: dorsalis pedis and posterior tibial pulses are palpable bilateral. Capillary return is immediate. Temperature gradient is WNL. Skin turgor WNL  Sensorium: Normal Semmes Weinstein monofilament test. Normal tactile sensation bilaterally. Nail Exam: Pt has thick disfigured discolored nails with subungual debris noted bilateral entire nail second toenail  through fifth toenails Ulcer Exam: There is no evidence of ulcer or pre-ulcerative changes or infection. Orthopedic Exam: Muscle tone and strength are WNL. No limitations in general ROM. No crepitus or effusions noted. Foot type and digits show no abnormalities. HAV  B/L. Skin: No Porokeratosis. No infection or ulcers. Asymptomatic   Pinch callus.  Sub 4 porokeratosis right foot asymptomatic.  Diagnosis:  Onychomycosis, , Pain in right toe, pain in left toes.   Treatment & Plan Procedures and Treatment: Consent by patient was obtained for treatment procedures. The patient understood the discussion of treatment and procedures well. All questions were answered thoroughly reviewed. Debridement of mycotic and hypertrophic toenails, 1 through 5 bilateral and clearing of subungual debris. No ulceration, no infection noted.  Return Visit-Office Procedure: Patient instructed to return to the office for a follow up visit 4 months for continued evaluation and treatment.    Helane Gunther DPM

## 2018-09-21 DIAGNOSIS — I2699 Other pulmonary embolism without acute cor pulmonale: Secondary | ICD-10-CM | POA: Diagnosis not present

## 2018-09-21 DIAGNOSIS — E119 Type 2 diabetes mellitus without complications: Secondary | ICD-10-CM | POA: Diagnosis not present

## 2018-09-21 DIAGNOSIS — G4700 Insomnia, unspecified: Secondary | ICD-10-CM | POA: Diagnosis not present

## 2018-09-21 DIAGNOSIS — Z8659 Personal history of other mental and behavioral disorders: Secondary | ICD-10-CM | POA: Diagnosis not present

## 2018-10-13 DIAGNOSIS — Z79899 Other long term (current) drug therapy: Secondary | ICD-10-CM | POA: Diagnosis not present

## 2018-10-13 DIAGNOSIS — Z5181 Encounter for therapeutic drug level monitoring: Secondary | ICD-10-CM | POA: Diagnosis not present

## 2018-11-28 DIAGNOSIS — Z79899 Other long term (current) drug therapy: Secondary | ICD-10-CM | POA: Diagnosis not present

## 2018-11-28 DIAGNOSIS — Z5181 Encounter for therapeutic drug level monitoring: Secondary | ICD-10-CM | POA: Diagnosis not present

## 2019-01-02 DIAGNOSIS — E1311 Other specified diabetes mellitus with ketoacidosis with coma: Secondary | ICD-10-CM | POA: Diagnosis not present

## 2019-01-02 DIAGNOSIS — Z5181 Encounter for therapeutic drug level monitoring: Secondary | ICD-10-CM | POA: Diagnosis not present

## 2019-01-02 DIAGNOSIS — Z8659 Personal history of other mental and behavioral disorders: Secondary | ICD-10-CM | POA: Diagnosis not present

## 2019-01-02 DIAGNOSIS — F259 Schizoaffective disorder, unspecified: Secondary | ICD-10-CM | POA: Diagnosis not present

## 2019-01-02 DIAGNOSIS — Z79899 Other long term (current) drug therapy: Secondary | ICD-10-CM | POA: Diagnosis not present

## 2019-01-02 DIAGNOSIS — I2699 Other pulmonary embolism without acute cor pulmonale: Secondary | ICD-10-CM | POA: Diagnosis not present

## 2019-01-20 ENCOUNTER — Ambulatory Visit: Payer: Medicare Other | Admitting: Podiatry

## 2019-01-23 ENCOUNTER — Ambulatory Visit (INDEPENDENT_AMBULATORY_CARE_PROVIDER_SITE_OTHER): Payer: Medicare Other | Admitting: Podiatry

## 2019-01-23 ENCOUNTER — Encounter: Payer: Self-pay | Admitting: Podiatry

## 2019-01-23 ENCOUNTER — Other Ambulatory Visit: Payer: Self-pay

## 2019-01-23 VITALS — Temp 97.1°F

## 2019-01-23 DIAGNOSIS — Q828 Other specified congenital malformations of skin: Secondary | ICD-10-CM | POA: Diagnosis not present

## 2019-01-23 DIAGNOSIS — E119 Type 2 diabetes mellitus without complications: Secondary | ICD-10-CM | POA: Diagnosis not present

## 2019-01-23 DIAGNOSIS — B351 Tinea unguium: Secondary | ICD-10-CM

## 2019-01-23 DIAGNOSIS — M79676 Pain in unspecified toe(s): Secondary | ICD-10-CM | POA: Diagnosis not present

## 2019-01-23 NOTE — Progress Notes (Signed)
Complaint:  Visit Type: Patient returns to my office for continued preventative foot care services. Complaint: Patient states" my nails have grown long and thick and become painful to walk and wear shoes" Patient has been diagnosed with DM with no foot complications. The patient presents for preventative foot care services. No changes to ROS.  Patient also says he has painful callus.  Podiatric Exam: Vascular: dorsalis pedis and posterior tibial pulses are palpable bilateral. Capillary return is immediate. Temperature gradient is WNL. Skin turgor WNL  Sensorium: Normal Semmes Weinstein monofilament test. Normal tactile sensation bilaterally. Nail Exam: Pt has thick disfigured discolored nails with subungual debris noted bilateral entire nail second toenail  through fifth toenails Ulcer Exam: There is no evidence of ulcer or pre-ulcerative changes or infection. Orthopedic Exam: Muscle tone and strength are WNL. No limitations in general ROM. No crepitus or effusions noted. Foot type and digits show no abnormalities. HAV  B/L. Skin: No Porokeratosis. No infection or ulcers. Asymptomatic   Pinch callus.  Sub 4 porokeratosis right foot symptomatic.  Diagnosis:  Onychomycosis, , Pain in right toe, pain in left toes. Porokeratosis right forefoot  Treatment & Plan Procedures and Treatment: Consent by patient was obtained for treatment procedures. The patient understood the discussion of treatment and procedures well. All questions were answered thoroughly reviewed. Debridement of mycotic and hypertrophic toenails, 1 through 5 bilateral and clearing of subungual debris. No ulceration, no infection noted.  Debride porokeratosis right foot. Return Visit-Office Procedure: Patient instructed to return to the office for a follow up visit 4 months for continued evaluation and treatment.    Gardiner Barefoot DPM

## 2019-02-01 DIAGNOSIS — Z20828 Contact with and (suspected) exposure to other viral communicable diseases: Secondary | ICD-10-CM | POA: Diagnosis not present

## 2019-02-01 DIAGNOSIS — R0981 Nasal congestion: Secondary | ICD-10-CM | POA: Diagnosis not present

## 2019-02-01 DIAGNOSIS — R05 Cough: Secondary | ICD-10-CM | POA: Diagnosis not present

## 2019-02-04 DIAGNOSIS — Z20828 Contact with and (suspected) exposure to other viral communicable diseases: Secondary | ICD-10-CM | POA: Diagnosis not present

## 2019-02-28 DIAGNOSIS — Z79899 Other long term (current) drug therapy: Secondary | ICD-10-CM | POA: Diagnosis not present

## 2019-02-28 DIAGNOSIS — Z5181 Encounter for therapeutic drug level monitoring: Secondary | ICD-10-CM | POA: Diagnosis not present

## 2019-03-03 DIAGNOSIS — R05 Cough: Secondary | ICD-10-CM | POA: Diagnosis not present

## 2019-03-03 DIAGNOSIS — Z20828 Contact with and (suspected) exposure to other viral communicable diseases: Secondary | ICD-10-CM | POA: Diagnosis not present

## 2019-03-03 DIAGNOSIS — R0981 Nasal congestion: Secondary | ICD-10-CM | POA: Diagnosis not present

## 2019-03-06 DIAGNOSIS — E1311 Other specified diabetes mellitus with ketoacidosis with coma: Secondary | ICD-10-CM | POA: Diagnosis not present

## 2019-03-06 DIAGNOSIS — I2699 Other pulmonary embolism without acute cor pulmonale: Secondary | ICD-10-CM | POA: Diagnosis not present

## 2019-03-06 DIAGNOSIS — E669 Obesity, unspecified: Secondary | ICD-10-CM | POA: Diagnosis not present

## 2019-03-17 ENCOUNTER — Emergency Department
Admission: EM | Admit: 2019-03-17 | Discharge: 2019-03-18 | Disposition: A | Discharge status: Home / Self Care | Payer: Medicare Other | Attending: Emergency Medicine | Admitting: Emergency Medicine

## 2019-03-17 ENCOUNTER — Other Ambulatory Visit: Payer: Self-pay

## 2019-03-17 ENCOUNTER — Encounter: Payer: Self-pay | Admitting: Medical Oncology

## 2019-03-17 DIAGNOSIS — F209 Schizophrenia, unspecified: Secondary | ICD-10-CM | POA: Diagnosis present

## 2019-03-17 DIAGNOSIS — R45851 Suicidal ideations: Secondary | ICD-10-CM | POA: Insufficient documentation

## 2019-03-17 DIAGNOSIS — Z20828 Contact with and (suspected) exposure to other viral communicable diseases: Secondary | ICD-10-CM | POA: Insufficient documentation

## 2019-03-17 DIAGNOSIS — R440 Auditory hallucinations: Secondary | ICD-10-CM

## 2019-03-17 DIAGNOSIS — Z79899 Other long term (current) drug therapy: Secondary | ICD-10-CM | POA: Insufficient documentation

## 2019-03-17 DIAGNOSIS — F203 Undifferentiated schizophrenia: Secondary | ICD-10-CM | POA: Diagnosis not present

## 2019-03-17 DIAGNOSIS — R443 Hallucinations, unspecified: Secondary | ICD-10-CM | POA: Diagnosis present

## 2019-03-17 DIAGNOSIS — E119 Type 2 diabetes mellitus without complications: Secondary | ICD-10-CM | POA: Insufficient documentation

## 2019-03-17 DIAGNOSIS — Z794 Long term (current) use of insulin: Secondary | ICD-10-CM | POA: Diagnosis not present

## 2019-03-17 DIAGNOSIS — F250 Schizoaffective disorder, bipolar type: Secondary | ICD-10-CM

## 2019-03-17 DIAGNOSIS — I10 Essential (primary) hypertension: Secondary | ICD-10-CM | POA: Insufficient documentation

## 2019-03-17 DIAGNOSIS — Z7901 Long term (current) use of anticoagulants: Secondary | ICD-10-CM | POA: Insufficient documentation

## 2019-03-17 DIAGNOSIS — Z03818 Encounter for observation for suspected exposure to other biological agents ruled out: Secondary | ICD-10-CM | POA: Diagnosis not present

## 2019-03-17 DIAGNOSIS — F17210 Nicotine dependence, cigarettes, uncomplicated: Secondary | ICD-10-CM | POA: Insufficient documentation

## 2019-03-17 LAB — URINE DRUG SCREEN, QUALITATIVE (ARMC ONLY)
Amphetamines, Ur Screen: NOT DETECTED
Barbiturates, Ur Screen: NOT DETECTED
Benzodiazepine, Ur Scrn: NOT DETECTED
Cannabinoid 50 Ng, Ur ~~LOC~~: NOT DETECTED
Cocaine Metabolite,Ur ~~LOC~~: NOT DETECTED
MDMA (Ecstasy)Ur Screen: NOT DETECTED
Methadone Scn, Ur: NOT DETECTED
Opiate, Ur Screen: NOT DETECTED
Phencyclidine (PCP) Ur S: NOT DETECTED
Tricyclic, Ur Screen: NOT DETECTED

## 2019-03-17 LAB — COMPREHENSIVE METABOLIC PANEL
ALT: 40 U/L (ref 0–44)
AST: 32 U/L (ref 15–41)
Albumin: 4.5 g/dL (ref 3.5–5.0)
Alkaline Phosphatase: 51 U/L (ref 38–126)
Anion gap: 7 (ref 5–15)
BUN: 19 mg/dL (ref 6–20)
CO2: 24 mmol/L (ref 22–32)
Calcium: 9.7 mg/dL (ref 8.9–10.3)
Chloride: 111 mmol/L (ref 98–111)
Creatinine, Ser: 1.01 mg/dL (ref 0.61–1.24)
GFR calc Af Amer: >60 mL/min (ref >60)
GFR calc non Af Amer: >60 mL/min (ref >60)
Glucose, Bld: 195 mg/dL — ABNORMAL HIGH (ref 70–99)
Potassium: 3.6 mmol/L (ref 3.5–5.1)
Sodium: 142 mmol/L (ref 135–145)
Total Bilirubin: 0.6 mg/dL (ref 0.3–1.2)
Total Protein: 6.6 g/dL (ref 6.5–8.1)

## 2019-03-17 LAB — CBC
HCT: 43.2 % (ref 39.0–52.0)
Hemoglobin: 15 g/dL (ref 13.0–17.0)
MCH: 32.1 pg (ref 26.0–34.0)
MCHC: 34.7 g/dL (ref 30.0–36.0)
MCV: 92.5 fL (ref 80.0–100.0)
Platelets: 175 10*3/uL (ref 150–400)
RBC: 4.67 MIL/uL (ref 4.22–5.81)
RDW: 12.5 % (ref 11.5–15.5)
WBC: 4.3 10*3/uL (ref 4.0–10.5)
nRBC: 0 % (ref 0.0–0.2)

## 2019-03-17 LAB — ACETAMINOPHEN LEVEL: Acetaminophen (Tylenol), Serum: <10 ug/mL — ABNORMAL LOW (ref 10–30)

## 2019-03-17 LAB — ETHANOL: Alcohol, Ethyl (B): <10 mg/dL (ref <10)

## 2019-03-17 LAB — SALICYLATE LEVEL: Salicylate Lvl: <7 mg/dL (ref 2.8–30.0)

## 2019-03-17 NOTE — ED Triage Notes (Signed)
Pt from group home with [facility]PD, voluntary with reports of having hallucinations and hearing voices that tells him to hurt himself. Pt reports hx of schizophrenia, has not missed any doses except tonight. Pt cooperative. Pt states that if he were to hurt himself he would do it by overdosing on pills.

## 2019-03-17 NOTE — ED Notes (Signed)
Pt states he is hearing voices telling him to kill himself, has been for awhile. Pt states he has SI thoughts because he hears voices. Pt is voluntary at this time

## 2019-03-17 NOTE — ED Notes (Signed)
Pt given ginger ale and warm blanket.

## 2019-03-18 DIAGNOSIS — F203 Undifferentiated schizophrenia: Secondary | ICD-10-CM | POA: Diagnosis not present

## 2019-03-18 LAB — SARS CORONAVIRUS 2 BY RT PCR (HOSPITAL ORDER, PERFORMED IN ~~LOC~~ HOSPITAL LAB): SARS Coronavirus 2: NEGATIVE

## 2019-03-18 MED ORDER — SERTRALINE HCL 50 MG PO TABS
50.0000 mg | ORAL_TABLET | Freq: Every day | ORAL | Status: DC
  Administered 2019-03-18: 50 mg via ORAL
  Filled 2019-03-18: qty 1

## 2019-03-18 MED ORDER — METFORMIN HCL 500 MG PO TABS: 1000.0000 mg | ORAL_TABLET | Freq: Two times a day (BID) | ORAL | Status: DC

## 2019-03-18 MED ORDER — LITHIUM CARBONATE ER 450 MG PO TBCR
450.0000 mg | EXTENDED_RELEASE_TABLET | Freq: Two times a day (BID) | ORAL | Status: DC
  Administered 2019-03-18: 14:00:00 450 mg via ORAL
  Filled 2019-03-18 (×2): qty 1

## 2019-03-18 NOTE — ED Notes (Signed)
Pt given ginger ale.

## 2019-03-18 NOTE — ED Notes (Signed)
RN called Ms. Kenton Kingfisher (legal guardian - [phone]). Ms. Kenton Kingfisher is agreeable to pt returning to his group home.

## 2019-03-18 NOTE — ED Notes (Signed)
RN attempted to call group home to arrange transportation. No answer. Unable to leave message [phone]).

## 2019-03-18 NOTE — ED Notes (Signed)
Pt has taken a shower.  

## 2019-03-18 NOTE — ED Notes (Signed)
[facility], pending Psych admit

## 2019-03-18 NOTE — Consult Note (Signed)
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  I've been there a year and all I do is sit around."  Patient seen and evaluated by this provider.  Patient is not was bonding to internal stimuli, no suicidal thoughts or homicidal thoughts, nor substance abuse.  He is currently unhappy with his group home as he is bored and lacks activities.  He enjoys the activities on the inpatient unit and would like to be admitted for this purpose.  Patient does not meet criteria for admission and no beds available.  No distress noted since admission to the emergency department.  Stable to return to his group home and follow-up with Freder[facility] Seals, his regular mental health provider.  HPI per MD:  Lance [name] is a 43 y.o. male with medical history as listed below which notably includes schizoaffective disorder versus schizophrenia.  He lives at a group home and called the police tonight because he said that for at least a week and possibly longer he has been gradually getting worse with hearing voices that are telling him to kill himself.  He does not want to do so but he is getting more more concerned that he is going to because he hears the voices all the time.  The symptoms are severe.  Nothing in particular makes it better or worse.  He denies drug and alcohol use.  He says he has been compliant with his medications at least until he came to the emergency department tonight.  He denies fever/chills, sore throat, loss of smell and taste, cough, chest pain, shortness of breath, nausea, vomiting, and abdominal pain.  Total Time spent with patient: 1 hour  Past Psychiatric History: schizophrenia, depression  Past Medical History:  Past Medical History:  Diagnosis Date  . Depression   . Diabetes  mellitus without complication (HCC)   . Hyperlipidemia   . Hypertension   . Lupus anticoagulant disorder (HCC) 06/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  . PE (pulmonary thromboembolism) (HCC)   . Schizo affective schizophrenia (HCC)   . Supratherapeutic INR 11/19/2016   History reviewed. No pertinent surgical history. Family History:  Family History  Problem Relation Age of Onset  . CAD Mother   . CAD Sister    Family Psychiatric  History: none Social History:  Social History   Substance and Sexual Activity  Alcohol Use No   Comment: occassionally     Social History   Substance and Sexual Activity  Drug Use Yes  . Types: Marijuana   Comment: Last use 11/29/16    Social History   Socioeconomic History  . Marital status: Single    Spouse name: Not on file  . Number of children: Not on file  . Years of education: Not on file  . Highest education level: Not on file  Occupational History  . Not on file  Social Needs  . Financial resource strain: Not on file  . Food insecurity    Worry: Not on file    Inability: Not on file  . Transportation needs    Medical: Not on file    Non-medical: Not on file  Tobacco Use  . Smoking status: Current Every Day Smoker    Packs/day: 1.00    Years: 23.00    Pack years: 23.00  Types: Cigarettes  . Smokeless tobacco: Never Used  . Tobacco comment: will provide material  Substance and Sexual Activity  . Alcohol use: No    Comment: occassionally  . Drug use: Yes    Types: Marijuana    Comment: Last use 11/29/16  . Sexual activity: Never    Birth control/protection: None    Comment: occasional marijuana- none recently  Lifestyle  . Physical activity    Days per week: Not on file    Minutes per session: Not on file  . Stress: Not on file  Relationships  . Social Musician on phone: Not on file    Gets together: Not on file    Attends religious service: Not on file    Active member of club or  organization: Not on file    Attends meetings of clubs or organizations: Not on file    Relationship status: Not on file  Other Topics Concern  . Not on file  Social History Narrative   From a group home in [place]    Has this patient used any form of tobacco in the last 30 days? (Cigarettes, Smokeless Tobacco, Cigars, and/or Pipes) A prescription for an FDA-approved tobacco cessation medication was offered at discharge and the patient refused  Current Medications: Current Facility-Administered Medications  Medication Dose Route Frequency Provider Last Rate Last Dose  . lithium carbonate (ESKALITH) CR tablet 450 mg  450 mg Oral Q12H ,  Y, NP      . metFORMIN (GLUCOPHAGE) tablet 1,000 mg  1,000 mg Oral BID WC Charm Rings, NP      . sertraline (ZOLOFT) tablet 50 mg  50 mg Oral Daily Charm Rings, NP       Current Outpatient Medications  Medication Sig Dispense Refill  . acetaminophen (TYLENOL) 500 MG tablet Take 500 mg by mouth every 6 (six) hours as needed for mild pain or headache.    Marland Kitchen acetaminophen (TYLENOL) 650 MG CR tablet Take 650 mg by mouth every 6 (six) hours as needed for pain.    Marland Kitchen albuterol (PROVENTIL HFA;VENTOLIN HFA) 108 (90 Base) MCG/ACT inhaler Inhale 1-2 puffs into the lungs every 4 (four) hours as needed for wheezing or shortness of breath. (Patient taking differently: Inhale 1 puff into the lungs 2 (two) times daily as needed for wheezing or shortness of breath. ) 1 Inhaler 0  . alum & mag hydroxide-simeth (MAALOX/MYLANTA) 200-200-20 MG/5ML suspension Take 30 mLs by mouth as needed for indigestion or heartburn.    . BD AUTOSHIELD DUO 30G X 5 MM MISC     . cloZAPine (CLOZARIL) 100 MG tablet Take 5 tablets (500 mg total) by mouth at bedtime. 150 tablet 0  . ELIQUIS 5 MG TABS tablet Take 5 mg by mouth every 12 (twelve) hours.    . fluticasone furoate-vilanterol (BREO ELLIPTA) 200-25 MCG/INH AEPB Inhale 1 puff into the lungs daily.    .  Fluticasone-Salmeterol (ADVAIR) 250-50 MCG/DOSE AEPB Inhale 1 puff into the lungs 2 (two) times daily.    Marland Kitchen LEVEMIR FLEXTOUCH 100 UNIT/ML Pen     . lisinopril (PRINIVIL,ZESTRIL) 2.5 MG tablet Take 2.5 mg by mouth daily.    Marland Kitchen lithium carbonate (ESKALITH) 450 MG CR tablet Take 1 tablet (450 mg total) by mouth every 12 (twelve) hours. 60 tablet 0  . metFORMIN (GLUCOPHAGE) 1000 MG tablet Take 1,000 mg by mouth 2 (two) times daily with a meal.    . nicotine (NICODERM CQ - DOSED  IN MG/24 HOURS) 21 mg/24hr patch Place 21 mg onto the skin daily.    . Omega-3 Fatty Acids (FISH OIL) 1000 MG CAPS Take 1,000 mg by mouth daily.    . sertraline (ZOLOFT) 50 MG tablet Take 1 tablet (50 mg total) by mouth daily. 30 tablet 0  . simvastatin (ZOCOR) 40 MG tablet Take 40 mg by mouth at bedtime.    . traZODone (DESYREL) 100 MG tablet Take 100 mg by mouth at bedtime.     Marland Kitchen warfarin (COUMADIN) 5 MG tablet Take 5 mg by mouth daily.      PTA Medications: (Not in a hospital admission)   Musculoskeletal: Strength & Muscle Tone: within normal limits Gait & Station: normal Patient leans: N/A  Psychiatric Specialty Exam: Physical Exam  Nursing note and vitals reviewed. Constitutional: He is oriented to person, place, and time. He appears well-developed and well-nourished.  HENT:  Head: Normocephalic.  Neck: Normal range of motion.  Respiratory: Effort normal.  Musculoskeletal: Normal range of motion.  Neurological: He is alert and oriented to person, place, and time.  Psychiatric: His speech is normal and behavior is normal. Judgment and thought content normal. His mood appears anxious. His affect is blunt. Cognition and memory are normal.    Review of Systems  Psychiatric/Behavioral: The patient is nervous/anxious.   All other systems reviewed and are negative.   Blood pressure 137/85, pulse 61, temperature 97.6 F (36.4 C), temperature source Oral, resp. rate 20, height 5\' 10"  (1.778 m), weight 90.7 kg,  SpO2 98 %.Body mass index is 28.7 kg/m.  General Appearance: Casual  Eye Contact:  Good  Speech:  Normal Rate  Volume:  Normal  Mood:  Anxious  Affect:  Blunt  Thought Process:  Coherent  Orientation:  Full (Time, Place, and Person)  Thought Content:  WDL and Logical  Suicidal Thoughts:  No  Homicidal Thoughts:  No  Memory:  Immediate;   Good Recent;   Good Remote;   Good  Judgement:  Fair  Insight:  Fair  Psychomotor Activity:  Normal  Concentration:  Concentration: Good and Attention Span: Good  Recall:  Good  Fund of Knowledge:  Fair  Language:  Good  Akathisia:  No  Handed:  Right  AIMS (if indicated):     Assets:  Housing Leisure Time Physical Health Resilience Social Support  ADL's:  Intact  Cognition:  WNL  Sleep:        Demographic Factors:  Male and Caucasian  Loss Factors: NA  Historical Factors: NA  Risk Reduction Factors:   Sense of responsibility to family, Living with another person, especially a relative, Positive social support and Positive therapeutic relationship  Continued Clinical Symptoms:  Anxiety, mild  Cognitive Features That Contribute To Risk:  None    Suicide Risk:  Minimal: No identifiable suicidal ideation.  Patients presenting with no risk factors but with morbid ruminations; may be classified as minimal risk based on the severity of the depressive symptoms   Plan Of Care/Follow-up recommendations:  Schizophrenia, undifferentiated: -Continue Lithium 450 mg BID -Continue Clozaril 500 mg at bedtime  Depression: -Continue Zoloft 50 mg daily  Insomnia: -Continue Trazodone 100 mg at bedtime Activity:  as tolerated Diet:  heart healthy diet  Disposition: discharge to group home Waylan Boga, NP 03/18/2019, 11:53 AM

## 2019-03-18 NOTE — ED Notes (Signed)
Pt given dinner tray.

## 2019-03-18 NOTE — ED Notes (Signed)
RN called group home and was reassured his ride was on the way.

## 2019-03-18 NOTE — ED Notes (Signed)
Report given to SOC MD.  

## 2019-03-18 NOTE — ED Notes (Signed)
Pt discharged back to group home. Legal guardian made aware. VS stable. All belongings returned to patient. Patient signed for discharge. Discharge instructions reviewed with patient. Pt denies SI/HI.

## 2019-03-18 NOTE — Discharge Instructions (Addendum)
Schizoaffective Disorder Schizoaffective disorder (ScAD) is a mental illness. It causes symptoms that are a mixture of a psychotic disorder (schizophrenia) and a mood (affective) disorder. A psychotic disorder involves losing touch with reality. ScAD usually occurs in cycles. Periods of severe symptoms may be followed by periods of less severe symptoms or improvement. The illness affects men and women equally, but it usually appears at an earlier age (teenage or early adult years) in men. People who have family members with schizophrenia, bipolar disorder, or ScAD are at higher risk of developing ScAD. ScAD may interfere with personal relationships or normal daily activities. People with ScAD are at a higher risk for:  Job loss.  Social aloneness (isolation).  Health problems.  Anxiety.  Substance use disorders.  Suicide. What are the causes? The exact cause of this condition is not known. What increases the risk? The following factors may make you more likely to develop this condition:  Problems during your mother's pregnancy and after your birth, such as: ? Your mother having the flu (influenza) during the second semester of the pregnancy. ? Exposure to drugs, alcohol, illnesses, or other poisons (toxins) before birth. ? Low birth weight.  A brain infection or viral infection.  Problems with brain structure or function.  Having family members with bipolar disorder, ScAD, or schizophrenia.  Substance abuse.  Having been diagnosed with a mental health condition in the past.  Being a victim of neglect or long-term (chronic) abuse. What are the signs or symptoms? At any one time, people with ScAD may have psychotic symptoms only or have both psychotic and affective symptoms. Psychotic symptoms may include:  Hearing, seeing, or feeling things that are not there (hallucinations).  Having fixed, false beliefs (delusions). The delusions usually include being attacked, harassed,  or plotted against (paranoid delusions).  Speaking in a way that makes no sense to others (disorganized speech).  Confusing or odd behavior.  Loss of motivation for normal daily activities, such as self-care.  Withdrawal from social contacts (social isolation).  Lack of emotions. Affective symptoms may include:  Symptoms similar to major depression, such as: ? Depressed mood. ? Loss of interest in activities that are usually pleasurable (anhedonia). ? Sleeping more or less than normal. ? Feeling worthless or excessively guilty. ? Lack of energy or motivation. ? Trouble concentrating. ? Eating more or less than usual. ? Thinking a lot about death or suicide.  Symptoms similar to bipolar mania. Bipolar mania refers to periods of severe elation, irritability, and high energy that are experienced by people who have bipolar disorder. These symptoms may include: ? Abnormally elevated or irritable mood. ? Abnormally increased energy or activity. ? More confidence than normal or feeling that you are able to do anything (grandiosity). ? Feeling rested after getting less sleep than normal. ? Being easily distracted. ? Talking more than usual or feeling pressure to keep talking. ? Feeling that your thoughts are racing. ? Engaging in high-risk activities. How is this diagnosed? ScAD is diagnosed through an assessment by your health care provider. Your health care provider may refer you to a mental health specialist for evaluation. The mental health specialist:  Will observe and ask questions about your thoughts, behavior, mood, and ability to function in daily life.  May ask questions about your medical history and use of drugs, including prescription medicines. Certain medical conditions and substances can cause symptoms that resemble ScAD.  May do blood tests and imaging tests. There are two types of ScAD:  Depressive   ScAD. This type is diagnosed when you have only depressive  symptoms.  Bipolar ScAD. This type is diagnosed if your affective symptoms are only manic or are a mixture of manic and depressive. How is this treated? ScAD is usually a lifelong (chronic) illness that requires long-term treatment. Treatment may include:  Medicine. Different types of medicine are used to treat ScAD. The exact combination depends on the type and severity of your symptoms. ? Antipsychotic medicine may be used to control psychotic symptoms such as delusions, paranoia, and hallucinations. ? Mood stabilizers may be used to balance the highs and lows of bipolar manic mood swings. ? Antidepressant medicines may be used to treat depressive symptoms.  Counseling or talk therapy. Individual, group, or family counseling may be helpful in providing education, support, and guidance. Many people with ScAD also benefit from social skills and job skills (vocational) training. A combination of medicine and counseling is usually best for managing the disorder over time. A procedure in which electricity is applied to the brain through the scalp (electroconvulsive therapy) may be used to treat people with severe manic symptoms who do not respond to medicine and counseling. Follow these instructions at home:   Take over-the-counter and prescription medicines only as told by your health care provider. Check with your health care provider before starting new medicines.  Surround yourself with people who care about you and can help you manage your condition.  Keep stress under control. Stress may make symptoms worse.  Avoid alcohol and drugs. They can affect how medicine works and make symptoms worse.  Keep all follow-up visits as told by your health care provider and counselor. This is important. Contact a health care provider if:  You are not able to take your medicines as prescribed.  Your symptoms get worse. Get help right away if:  You feel out of control.  You or others notice  warning signs of suicide, such as: ? Increased use of drugs or alcohol ? Expressing feelings of not having a purpose in life or feeling trapped, guilty, anxious, agitated, or hopeless. ? Withdrawing from friends and family. ? Showing uncontrolled anger, recklessness, and dramatic mood changes. ? Talking about suicide or searching for methods. If you ever feel like you may hurt yourself or others, or have thoughts about taking your own life, get help right away. You can go to your nearest emergency department or call:  Your local emergency services (911 in the [place]  A suicide crisis helpline, such as the National Suicide Prevention Lifeline at 1-800-273-8255. This is open 24 hours a day. Summary  Schizoaffective disorder causes symptoms that are a mixture of a psychotic disorder and a mood disorder.  A combination of medicine and counseling is usually best for managing the disorder over time.  People who have schizoaffective disorder are at risk for suicide. Get help right away if you or someone else notices warning signs of suicide. This information is not intended to replace advice given to you by your health care provider. Make sure you discuss any questions you have with your health care provider. Document Released: 10/12/2006 Document Revised: 05/14/2017 Document Reviewed: 03/13/2016 Elsevier Patient Education  2020 [employer]  

## 2019-03-18 NOTE — ED Notes (Signed)
Group home owner will be here in one hour to pick up patient.

## 2019-03-18 NOTE — ED Provider Notes (Signed)
[facility] Emergency Department Provider Note  ____________________________________________   First MD Initiated Contact with Patient 03/17/19 2336     (approximate)  I have reviewed the triage vital signs and the nursing notes.   HISTORY  Chief Complaint Suicidal and Hallucinations  Level 5 caveat: History may be limited by the patient's chronic mental illness and/or an acute exacerbation thereof.  HPI Lance [name] is a 43 y.o. male with medical history as listed below which notably includes schizoaffective disorder versus schizophrenia.  He lives at a group home and called the police tonight because he said that for at least a week and possibly longer he has been gradually getting worse with hearing voices that are telling him to kill himself.  He does not want to do so but he is getting more more concerned that he is going to because he hears the voices all the time.  The symptoms are severe.  Nothing in particular makes it better or worse.  He denies drug and alcohol use.  He says he has been compliant with his medications at least until he came to the emergency department tonight.  He denies fever/chills, sore throat, loss of smell and taste, cough, chest pain, shortness of breath, nausea, vomiting, and abdominal pain.         Past Medical History:  Diagnosis Date  . Depression   . Diabetes mellitus without complication (HCC)   . Hyperlipidemia   . Hypertension   . Lupus anticoagulant disorder (HCC) 06/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  . PE (pulmonary thromboembolism) (HCC)   . Schizo affective schizophrenia (HCC)   . Supratherapeutic INR 11/19/2016    Patient Active Problem List   Diagnosis Date Noted  . Alcohol abuse 04/12/2018  . Schizophrenia (HCC) 10/15/2017  . 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 Tobacco use disorder 09/01/2016  . Schizoaffective disorder (HCC) 05/22/2016  . Diabetes mellitus without  complication (HCC) 05/21/2016  . Hyperlipidemia 05/21/2016  . History of pulmonary embolism 05/21/2016  . Chronic anticoagulation 05/21/2016  . Hypertension 09/25/2015    History reviewed. No pertinent surgical history.  Prior to Admission medications   Medication Sig Start Date End Date Taking? Authorizing Provider  acetaminophen (TYLENOL) 500 MG tablet Take 500 mg by mouth every 6 (six) hours as needed for mild pain or headache.    [provider]  acetaminophen (TYLENOL) 650 MG CR tablet Take 650 mg by mouth every 6 (six) hours as needed for pain.    [provider]  albuterol (PROVENTIL HFA;VENTOLIN HFA) 108 (90 Base) MCG/ACT inhaler Inhale 1-2 puffs into the lungs every 4 (four) hours as needed for wheezing or shortness of breath. Patient taking differently: Inhale 1 puff into the lungs 2 (two) times daily as needed for wheezing or shortness of breath.  12/31/16   Jimmy Footman, MD  alum & mag hydroxide-simeth (MAALOX/MYLANTA) 200-200-20 MG/5ML suspension Take 30 mLs by mouth as needed for indigestion or heartburn.    [provider]  ammonium lactate (LAC-HYDRIN) 12 % cream Apply topically as needed for dry skin. 05/05/18   Helane Gunther, DPM  BD AUTOSHIELD DUO 30G X 5 MM MISC  09/29/18   [provider]  chlorhexidine (PERIDEX) 0.12 % solution Use as directed 10 mLs in the mouth or throat 2 (two) times daily.    [provider]  cloZAPine (CLOZARIL) 100 MG tablet Take 5 tablets (500 mg total) by mouth at bedtime. 12/03/16  Jimmy Footman, MD  fluticasone furoate-vilanterol (BREO ELLIPTA) 200-25 MCG/INH AEPB Inhale 1 puff into the lungs daily.    [provider]  Fluticasone-Salmeterol (ADVAIR) 250-50 MCG/DOSE AEPB Inhale 1 puff into the lungs 2 (two) times daily.    [provider]  ibuprofen (ADVIL,MOTRIN) 600 MG tablet Take 600 mg by mouth every 6 (six) hours as needed (inflammation).    [provider]  LEVEMIR FLEXTOUCH 100 UNIT/ML Pen  10/10/18   [provider]  lisinopril (PRINIVIL,ZESTRIL) 2.5 MG tablet Take 2.5 mg by mouth daily.    [provider]  lithium carbonate (ESKALITH) 450 MG CR tablet Take 1 tablet (450 mg total) by mouth every 12 (twelve) hours. 09/14/16   Jimmy Footman, MD  magnesium hydroxide (MILK OF MAGNESIA) 400 MG/5ML suspension Take 30 mLs by mouth daily as needed for mild constipation.    [provider]  metFORMIN (GLUCOPHAGE) 1000 MG tablet Take 1,000 mg by mouth 2 (two) times daily with a meal.    [provider]  nicotine (NICODERM CQ - DOSED IN MG/24 HOURS) 21 mg/24hr patch Place 21 mg onto the skin daily.    [provider]  Omega-3 Fatty Acids (FISH OIL) 1000 MG CAPS Take 1,000 mg by mouth daily.    [provider]  ondansetron (ZOFRAN) 4 MG tablet Take 4 mg by mouth every 8 (eight) hours as needed for nausea or vomiting.    [provider]  polyethylene glycol (MIRALAX / GLYCOLAX) packet Take 17 g by mouth daily. 09/15/16   Jimmy Footman, MD  sertraline (ZOLOFT) 50 MG tablet Take 1 tablet (50 mg total) by mouth daily. 10/22/17   McNew, Ileene Hutchinson, MD  simvastatin (ZOCOR) 40 MG tablet Take 40 mg by mouth at bedtime.    [provider]  traZODone (DESYREL) 100 MG tablet Take 100 mg by mouth at bedtime.     [provider]  warfarin (COUMADIN) 5 MG tablet Take 5 mg by mouth daily.     [provider]    Allergies Penicillins  Family History  Problem Relation Age of Onset  . CAD Mother   . CAD Sister     Social History Social History   Tobacco Use  . Smoking status: Current Every Day Smoker    Packs/day: 1.00    Years: 23.00    Pack years: 23.00    Types: Cigarettes  . Smokeless tobacco: Never Used  . Tobacco comment: will provide material  Substance Use Topics  . Alcohol use: No    Comment: occassionally  . Drug use: Yes     Types: Marijuana    Comment: Last use 11/29/16    Review of Systems Level 5 caveat: History may be limited by the patient's chronic mental illness and/or an acute exacerbation thereof.  Constitutional: No fever/chills Eyes: No visual changes. ENT: No sore throat. Cardiovascular: Denies chest pain. Respiratory: Denies shortness of breath. Gastrointestinal: No abdominal pain.  No nausea, no vomiting.  No diarrhea.  No constipation. Genitourinary: Negative for dysuria. Musculoskeletal: Negative for neck pain.  Negative for back pain. Integumentary: Negative for rash. Neurological: Negative for headaches, focal weakness or numbness. Psychiatric:  Auditory command hallucinations telling him to kill himself.   ____________________________________________   PHYSICAL EXAM:  VITAL SIGNS: ED Triage Vitals  Enc Vitals Group     BP 03/17/19 2222 133/87     Pulse Rate 03/17/19 2222 78     Resp 03/17/19 2222 16  Temp 03/17/19 2222 98.7 F (37.1 C)     Temp Source 03/17/19 2222 Oral     SpO2 03/17/19 2222 98 %     Weight 03/17/19 2223 90.7 kg (200 lb)     Height 03/17/19 2223 1.778 m (5\' 10" )     Head Circumference --      Peak Flow --      Pain Score 03/17/19 2222 0     Pain Loc --      Pain Edu? --      Excl. in [place]? --     Constitutional: Alert and oriented.  Disheveled but generally appropriate, not in acute distress. Eyes: Conjunctivae are normal.  Head: Atraumatic. Nose: No congestion/rhinnorhea. Mouth/Throat: Mucous membranes are moist. Neck: No stridor.  No meningeal signs.   Cardiovascular: Normal rate, regular rhythm. Good peripheral circulation. Grossly normal heart sounds. Respiratory: Normal respiratory effort.  No retractions. Gastrointestinal: Soft and nontender. No distention.  Musculoskeletal: No lower extremity tenderness nor edema. No gross deformities of extremities. Neurologic:  Normal speech and language. No gross focal neurologic deficits are  appreciated.  Skin:  Skin is warm, dry and intact. Psychiatric: Mood and affect are calm and cooperative.  He admits to suicidal ideation as a result of the auditory command hallucinations that he hears right now.  Suicide plan would be through overdose.  ____________________________________________   LABS (all labs ordered are listed, but only abnormal results are displayed)  Labs Reviewed  COMPREHENSIVE METABOLIC PANEL - Abnormal; Notable for the following components:      Result Value   Glucose, Bld 195 (*)    All other components within normal limits  ACETAMINOPHEN LEVEL - Abnormal; Notable for the following components:   Acetaminophen (Tylenol), Serum <10 (*)    All other components within normal limits  SARS CORONAVIRUS 2 (HOSPITAL ORDER, PERFORMED IN [facility])  ETHANOL  SALICYLATE LEVEL  CBC  URINE DRUG SCREEN, QUALITATIVE ([facility] ONLY)   ____________________________________________  EKG  ED ECG REPORT I, Loleta Roseory Warrene Kapfer, the attending physician, personally viewed and interpreted this ECG.  Date: 03/18/2019 EKG Time: 00:49 Rate: 62 Rhythm: normal sinus rhythm QRS Axis: normal Intervals: RBBB, QTc 458 ms ST/T Wave abnormalities: Non-specific ST segment / T-wave changes, but no clear evidence of acute ischemia. Narrative Interpretation: no definitive evidence of acute ischemia; does not meet STEMI criteria.   ____________________________________________  RADIOLOGY I, Loleta Roseory Surah Pelley, personally viewed and evaluated these images (plain radiographs) as part of my medical decision making, as well as reviewing the written report by the radiologist.  ED MD interpretation: No indication for imaging  Official radiology report(s): No results found.  ____________________________________________   PROCEDURES   Procedure(s) performed (including Critical Care):  Procedures   ____________________________________________   INITIAL IMPRESSION / MDM /  ASSESSMENT AND PLAN / ED COURSE  As part of my medical decision making, I reviewed the following data within the electronic MEDICAL RECORD NUMBER Nursing notes reviewed and incorporated, Labs reviewed , EKG interpreted , Old chart reviewed, A consult was requested and obtained from this/these consultant(s) Psychiatry and Notes from prior ED visits   Differential diagnosis includes, but is not limited to, acute on chronic schizophrenia, true suicidal ideation, medication side effect, and drug use.  The patient is calm cooperative now but he has a legal guardian and apparently even at baseline does not have the capacity to make his own decisions.  Given the command hallucinations he is hearing telling him to kill himself and active  suicidal ideation with a plan, I have placed him under involuntary commitment.  I have also ordered a rapid COVID swab to facilitate placement in case it is needed.  I have ordered psychiatry [place].  Labs are all reassuring and within normal limits with no evidence of acute infection.  The patient is medically cleared for psychiatric disposition.  Vital signs are stable and within normal limits.      Clinical Course as of Mar 17 509  Sat Mar 18, 2019  0139 SARS Coronavirus 2: NEGATIVE [CF]  0509 Tele-psychiatry recommends inpatient treatment.  Recommended as needed meds for agitation but is deferring treatment medications to the inpatient team.   [CF]    Clinical Course User Index [CF] Hinda Kehr, MD     ____________________________________________  FINAL CLINICAL IMPRESSION(S) / ED DIAGNOSES  Final diagnoses:  Severe auditory hallucinations  Suicidal ideation  Schizoaffective disorder, bipolar type ([place])     MEDICATIONS GIVEN DURING THIS VISIT:  Medications - No data to display   ED Discharge Orders    None      *Please note:  Garland Hincapie was evaluated in Emergency Department on 03/18/2019 for the symptoms described in the history of present  illness. He was evaluated in the context of the global COVID-19 pandemic, which necessitated consideration that the patient might be at risk for infection with the SARS-CoV-2 virus that causes COVID-19. Institutional protocols and algorithms that pertain to the evaluation of patients at risk for COVID-19 are in a state of rapid change based on information released by regulatory bodies including the CDC and federal and state organizations. These policies and algorithms were followed during the patient's care in the ED.  Some ED evaluations and interventions may be delayed as a result of limited staffing during the pandemic.*  Note:  This document was prepared using Dragon voice recognition software and may include unintentional dictation errors.   Hinda Kehr, MD 03/18/19 [phone]

## 2019-04-17 ENCOUNTER — Other Ambulatory Visit (HOSPITAL_COMMUNITY): Payer: Self-pay | Admitting: Psychiatry

## 2019-04-26 ENCOUNTER — Other Ambulatory Visit (HOSPITAL_COMMUNITY): Payer: Self-pay | Admitting: Psychiatry

## 2019-05-08 DIAGNOSIS — I2782 Chronic pulmonary embolism: Secondary | ICD-10-CM | POA: Diagnosis not present

## 2019-05-08 DIAGNOSIS — I10 Essential (primary) hypertension: Secondary | ICD-10-CM | POA: Diagnosis not present

## 2019-05-08 DIAGNOSIS — E1311 Other specified diabetes mellitus with ketoacidosis with coma: Secondary | ICD-10-CM | POA: Diagnosis not present

## 2019-05-08 DIAGNOSIS — E669 Obesity, unspecified: Secondary | ICD-10-CM | POA: Diagnosis not present

## 2019-05-09 DIAGNOSIS — Z79899 Other long term (current) drug therapy: Secondary | ICD-10-CM | POA: Diagnosis not present

## 2019-05-09 DIAGNOSIS — F209 Schizophrenia, unspecified: Secondary | ICD-10-CM | POA: Diagnosis not present

## 2019-05-19 ENCOUNTER — Other Ambulatory Visit (HOSPITAL_COMMUNITY): Payer: Self-pay | Admitting: Psychiatry

## 2019-05-25 ENCOUNTER — Other Ambulatory Visit: Payer: Self-pay

## 2019-05-25 ENCOUNTER — Ambulatory Visit (INDEPENDENT_AMBULATORY_CARE_PROVIDER_SITE_OTHER): Payer: Medicare Other | Admitting: Podiatry

## 2019-05-25 DIAGNOSIS — M79676 Pain in unspecified toe(s): Secondary | ICD-10-CM | POA: Diagnosis not present

## 2019-05-25 DIAGNOSIS — B351 Tinea unguium: Secondary | ICD-10-CM

## 2019-05-25 DIAGNOSIS — Q828 Other specified congenital malformations of skin: Secondary | ICD-10-CM

## 2019-05-25 DIAGNOSIS — E119 Type 2 diabetes mellitus without complications: Secondary | ICD-10-CM | POA: Diagnosis not present

## 2019-05-25 NOTE — Progress Notes (Signed)
Complaint:  Visit Type: Patient returns to my office for continued preventative foot care services. Complaint: Patient states" my nails have grown long and thick and become painful to walk and wear shoes" Patient has been diagnosed with DM with no foot complications. The patient presents for preventative foot care services. No changes to ROS.  Patient also says he has painful callus.  Podiatric Exam: Vascular: dorsalis pedis and posterior tibial pulses are palpable bilateral. Capillary return is immediate. Temperature gradient is WNL. Skin turgor WNL  Sensorium: Normal Semmes Weinstein monofilament test. Normal tactile sensation bilaterally. Nail Exam: Pt has thick disfigured discolored nails with subungual debris noted bilateral entire nail second toenail  through fifth toenails Ulcer Exam: There is no evidence of ulcer or pre-ulcerative changes or infection. Orthopedic Exam: Muscle tone and strength are WNL. No limitations in general ROM. No crepitus or effusions noted. Foot type and digits show no abnormalities. HAV  B/L. Skin: No Porokeratosis. No infection or ulcers. Asymptomatic   Pinch callus.  Sub 4 porokeratosis right foot symptomatic.  Diagnosis:  Onychomycosis, , Pain in right toe, pain in left toes. Porokeratosis right forefoot  Treatment & Plan Procedures and Treatment: Consent by patient was obtained for treatment procedures. The patient understood the discussion of treatment and procedures well. All questions were answered thoroughly reviewed. Debridement of mycotic and hypertrophic toenails, 1 through 5 bilateral and clearing of subungual debris. No ulceration, no infection noted.  Debride porokeratosis right foot. Return Visit-Office Procedure: Patient instructed to return to the office for a follow up visit 4 months for continued evaluation and treatment.    Gardiner Barefoot DPM

## 2019-05-26 ENCOUNTER — Other Ambulatory Visit (HOSPITAL_COMMUNITY): Payer: Self-pay | Admitting: Psychiatry

## 2019-06-15 DIAGNOSIS — Z20828 Contact with and (suspected) exposure to other viral communicable diseases: Secondary | ICD-10-CM | POA: Diagnosis not present

## 2019-06-23 ENCOUNTER — Other Ambulatory Visit (HOSPITAL_COMMUNITY): Payer: Self-pay | Admitting: Psychiatry

## 2019-07-07 DIAGNOSIS — F209 Schizophrenia, unspecified: Secondary | ICD-10-CM | POA: Diagnosis not present

## 2019-07-07 DIAGNOSIS — Z79899 Other long term (current) drug therapy: Secondary | ICD-10-CM | POA: Diagnosis not present

## 2019-08-02 DIAGNOSIS — R05 Cough: Secondary | ICD-10-CM | POA: Diagnosis not present

## 2019-08-02 DIAGNOSIS — Z20828 Contact with and (suspected) exposure to other viral communicable diseases: Secondary | ICD-10-CM | POA: Diagnosis not present

## 2019-08-02 DIAGNOSIS — J310 Chronic rhinitis: Secondary | ICD-10-CM | POA: Diagnosis not present

## 2019-08-07 DIAGNOSIS — F209 Schizophrenia, unspecified: Secondary | ICD-10-CM | POA: Diagnosis not present

## 2019-08-07 DIAGNOSIS — F259 Schizoaffective disorder, unspecified: Secondary | ICD-10-CM | POA: Diagnosis not present

## 2019-08-07 DIAGNOSIS — Z79899 Other long term (current) drug therapy: Secondary | ICD-10-CM | POA: Diagnosis not present

## 2019-08-07 DIAGNOSIS — I2699 Other pulmonary embolism without acute cor pulmonale: Secondary | ICD-10-CM | POA: Diagnosis not present

## 2019-08-07 DIAGNOSIS — I2782 Chronic pulmonary embolism: Secondary | ICD-10-CM | POA: Diagnosis not present

## 2019-08-07 DIAGNOSIS — Z8659 Personal history of other mental and behavioral disorders: Secondary | ICD-10-CM | POA: Diagnosis not present

## 2019-08-11 DIAGNOSIS — J310 Chronic rhinitis: Secondary | ICD-10-CM | POA: Diagnosis not present

## 2019-08-11 DIAGNOSIS — Z20828 Contact with and (suspected) exposure to other viral communicable diseases: Secondary | ICD-10-CM | POA: Diagnosis not present

## 2019-08-11 DIAGNOSIS — R05 Cough: Secondary | ICD-10-CM | POA: Diagnosis not present

## 2019-08-18 DIAGNOSIS — R05 Cough: Secondary | ICD-10-CM | POA: Diagnosis not present

## 2019-08-18 DIAGNOSIS — Z20828 Contact with and (suspected) exposure to other viral communicable diseases: Secondary | ICD-10-CM | POA: Diagnosis not present

## 2019-09-04 DIAGNOSIS — Z79899 Other long term (current) drug therapy: Secondary | ICD-10-CM | POA: Diagnosis not present

## 2019-09-04 DIAGNOSIS — F3131 Bipolar disorder, current episode depressed, mild: Secondary | ICD-10-CM | POA: Diagnosis not present

## 2019-09-04 DIAGNOSIS — E1141 Type 2 diabetes mellitus with diabetic mononeuropathy: Secondary | ICD-10-CM | POA: Diagnosis not present

## 2019-09-04 DIAGNOSIS — I2699 Other pulmonary embolism without acute cor pulmonale: Secondary | ICD-10-CM | POA: Diagnosis not present

## 2019-09-04 DIAGNOSIS — E663 Overweight: Secondary | ICD-10-CM | POA: Diagnosis not present

## 2019-09-04 DIAGNOSIS — F209 Schizophrenia, unspecified: Secondary | ICD-10-CM | POA: Diagnosis not present

## 2019-09-11 DIAGNOSIS — F3131 Bipolar disorder, current episode depressed, mild: Secondary | ICD-10-CM | POA: Diagnosis not present

## 2019-09-11 DIAGNOSIS — E663 Overweight: Secondary | ICD-10-CM | POA: Diagnosis not present

## 2019-09-11 DIAGNOSIS — E1141 Type 2 diabetes mellitus with diabetic mononeuropathy: Secondary | ICD-10-CM | POA: Diagnosis not present

## 2019-09-11 DIAGNOSIS — I10 Essential (primary) hypertension: Secondary | ICD-10-CM | POA: Diagnosis not present

## 2019-09-25 ENCOUNTER — Encounter: Payer: Self-pay | Admitting: Podiatry

## 2019-09-25 ENCOUNTER — Ambulatory Visit (INDEPENDENT_AMBULATORY_CARE_PROVIDER_SITE_OTHER): Payer: Medicare Other | Admitting: Podiatry

## 2019-09-25 ENCOUNTER — Encounter (INDEPENDENT_AMBULATORY_CARE_PROVIDER_SITE_OTHER): Payer: Self-pay

## 2019-09-25 ENCOUNTER — Other Ambulatory Visit: Payer: Self-pay

## 2019-09-25 VITALS — Temp 97.6°F

## 2019-09-25 DIAGNOSIS — Z7901 Long term (current) use of anticoagulants: Secondary | ICD-10-CM | POA: Diagnosis not present

## 2019-09-25 DIAGNOSIS — M79676 Pain in unspecified toe(s): Secondary | ICD-10-CM

## 2019-09-25 DIAGNOSIS — B351 Tinea unguium: Secondary | ICD-10-CM

## 2019-09-25 DIAGNOSIS — E119 Type 2 diabetes mellitus without complications: Secondary | ICD-10-CM

## 2019-09-25 DIAGNOSIS — Q828 Other specified congenital malformations of skin: Secondary | ICD-10-CM

## 2019-09-25 NOTE — Progress Notes (Signed)
This patient returns to my office for at risk foot care.  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  Patient is taking eliquiss.  Patient has painful forefoot callus right foot. This patient is unable to cut nails himself since the patient cannot reach his nails.These nails are painful walking and wearing shoes.  This patient presents for at risk foot care today.  General Appearance  Alert, conversant and in no acute stress.  Vascular  Dorsalis pedis and posterior tibial  pulses are palpable  bilaterally.  Capillary return is within normal limits  bilaterally. Temperature is within normal limits  bilaterally.  Neurologic  Senn-Weinstein monofilament wire test within normal limits  bilaterally. Muscle power within normal limits bilaterally.  Nails Thick disfigured discolored nails with subungual debris  from hallux to fifth toes bilaterally. No evidence of bacterial infection or drainage bilaterally.  Orthopedic  No limitations of motion  feet .  No crepitus or effusions noted.  No bony pathology or digital deformities noted.  Skin  normotropic skin  noted bilaterally.  No signs of infections or ulcers noted.   Porokeratosis sub 4 right foot.  Onychomycosis  Pain in right toes  Pain in left toes  Porokeratosis right foot.  Consent was obtained for treatment procedures.   Mechanical debridement of nails 1-5  bilaterally performed with a nail nipper.  Filed with dremel without incident. Debride porokeratosis right foot.   Return office visit   3 months                   Told patient to return for periodic foot care and evaluation due to potential at risk complications.   Helane Gunther DPM

## 2019-09-29 DIAGNOSIS — Z20828 Contact with and (suspected) exposure to other viral communicable diseases: Secondary | ICD-10-CM | POA: Diagnosis not present

## 2019-11-02 DIAGNOSIS — Z20828 Contact with and (suspected) exposure to other viral communicable diseases: Secondary | ICD-10-CM | POA: Diagnosis not present

## 2019-11-10 ENCOUNTER — Emergency Department
Admission: EM | Admit: 2019-11-10 | Discharge: 2019-11-11 | Disposition: A | Discharge status: Other Acute Inpatient Hospital | Payer: Medicare Other | Attending: Emergency Medicine | Admitting: Emergency Medicine

## 2019-11-10 ENCOUNTER — Other Ambulatory Visit: Payer: Self-pay

## 2019-11-10 DIAGNOSIS — Z03818 Encounter for observation for suspected exposure to other biological agents ruled out: Secondary | ICD-10-CM | POA: Diagnosis not present

## 2019-11-10 DIAGNOSIS — Z7984 Long term (current) use of oral hypoglycemic drugs: Secondary | ICD-10-CM | POA: Insufficient documentation

## 2019-11-10 DIAGNOSIS — Z7901 Long term (current) use of anticoagulants: Secondary | ICD-10-CM | POA: Insufficient documentation

## 2019-11-10 DIAGNOSIS — F200 Paranoid schizophrenia: Secondary | ICD-10-CM | POA: Diagnosis not present

## 2019-11-10 DIAGNOSIS — F4310 Post-traumatic stress disorder, unspecified: Secondary | ICD-10-CM | POA: Diagnosis not present

## 2019-11-10 DIAGNOSIS — E7800 Pure hypercholesterolemia, unspecified: Secondary | ICD-10-CM | POA: Diagnosis not present

## 2019-11-10 DIAGNOSIS — R45851 Suicidal ideations: Secondary | ICD-10-CM | POA: Diagnosis not present

## 2019-11-10 DIAGNOSIS — Z86711 Personal history of pulmonary embolism: Secondary | ICD-10-CM | POA: Insufficient documentation

## 2019-11-10 DIAGNOSIS — I10 Essential (primary) hypertension: Secondary | ICD-10-CM | POA: Insufficient documentation

## 2019-11-10 DIAGNOSIS — F251 Schizoaffective disorder, depressive type: Secondary | ICD-10-CM | POA: Insufficient documentation

## 2019-11-10 DIAGNOSIS — Z20822 Contact with and (suspected) exposure to covid-19: Secondary | ICD-10-CM | POA: Insufficient documentation

## 2019-11-10 DIAGNOSIS — F203 Undifferentiated schizophrenia: Secondary | ICD-10-CM | POA: Diagnosis not present

## 2019-11-10 DIAGNOSIS — Z125 Encounter for screening for malignant neoplasm of prostate: Secondary | ICD-10-CM | POA: Diagnosis not present

## 2019-11-10 DIAGNOSIS — F17210 Nicotine dependence, cigarettes, uncomplicated: Secondary | ICD-10-CM | POA: Insufficient documentation

## 2019-11-10 DIAGNOSIS — E119 Type 2 diabetes mellitus without complications: Secondary | ICD-10-CM | POA: Diagnosis not present

## 2019-11-10 DIAGNOSIS — Z20828 Contact with and (suspected) exposure to other viral communicable diseases: Secondary | ICD-10-CM | POA: Diagnosis not present

## 2019-11-10 DIAGNOSIS — Z79899 Other long term (current) drug therapy: Secondary | ICD-10-CM | POA: Insufficient documentation

## 2019-11-10 DIAGNOSIS — R45850 Homicidal ideations: Secondary | ICD-10-CM | POA: Insufficient documentation

## 2019-11-10 LAB — URINE DRUG SCREEN, QUALITATIVE (ARMC ONLY)
Amphetamines, Ur Screen: NOT DETECTED
Barbiturates, Ur Screen: NOT DETECTED
Benzodiazepine, Ur Scrn: NOT DETECTED
Cannabinoid 50 Ng, Ur ~~LOC~~: NOT DETECTED
Cocaine Metabolite,Ur ~~LOC~~: NOT DETECTED
MDMA (Ecstasy)Ur Screen: NOT DETECTED
Methadone Scn, Ur: NOT DETECTED
Opiate, Ur Screen: NOT DETECTED
Phencyclidine (PCP) Ur S: NOT DETECTED
Tricyclic, Ur Screen: NOT DETECTED

## 2019-11-10 LAB — CBC
HCT: 44.8 % (ref 39.0–52.0)
Hemoglobin: 15.7 g/dL (ref 13.0–17.0)
MCH: 32.4 pg (ref 26.0–34.0)
MCHC: 35 g/dL (ref 30.0–36.0)
MCV: 92.6 fL (ref 80.0–100.0)
Platelets: 216 10*3/uL (ref 150–400)
RBC: 4.84 MIL/uL (ref 4.22–5.81)
RDW: 12.6 % (ref 11.5–15.5)
WBC: 5.3 10*3/uL (ref 4.0–10.5)
nRBC: 0 % (ref 0.0–0.2)

## 2019-11-10 LAB — COMPREHENSIVE METABOLIC PANEL
ALT: 20 U/L (ref 0–44)
AST: 21 U/L (ref 15–41)
Albumin: 4.8 g/dL (ref 3.5–5.0)
Alkaline Phosphatase: 56 U/L (ref 38–126)
Anion gap: 11 (ref 5–15)
BUN: 17 mg/dL (ref 6–20)
CO2: 22 mmol/L (ref 22–32)
Calcium: 10.6 mg/dL — ABNORMAL HIGH (ref 8.9–10.3)
Chloride: 106 mmol/L (ref 98–111)
Creatinine, Ser: 1.13 mg/dL (ref 0.61–1.24)
GFR calc Af Amer: >60 mL/min (ref >60)
GFR calc non Af Amer: >60 mL/min (ref >60)
Glucose, Bld: 151 mg/dL — ABNORMAL HIGH (ref 70–99)
Potassium: 3.8 mmol/L (ref 3.5–5.1)
Sodium: 139 mmol/L (ref 135–145)
Total Bilirubin: 0.6 mg/dL (ref 0.3–1.2)
Total Protein: 7.3 g/dL (ref 6.5–8.1)

## 2019-11-10 LAB — ACETAMINOPHEN LEVEL: Acetaminophen (Tylenol), Serum: <10 ug/mL — ABNORMAL LOW (ref 10–30)

## 2019-11-10 LAB — ETHANOL: Alcohol, Ethyl (B): <10 mg/dL (ref <10)

## 2019-11-10 LAB — SALICYLATE LEVEL: Salicylate Lvl: <7 mg/dL — ABNORMAL LOW (ref 7.0–30.0)

## 2019-11-10 NOTE — ED Provider Notes (Addendum)
[facility] Emergency Department Provider Note  ____________________________________________  Time seen: Approximately 11:37 PM  I have reviewed the triage vital signs and the nursing notes.   HISTORY  Chief Complaint Suicidal   HPI Lance [name] is a 44 y.o. male with a history of depression, schizophrenia, PE on Eliquis, hypertension, hyperlipidemia, diabetes who presents for evaluation of suicidal thoughts.  Patient reports that he is unhappy living in his group home.  He reports that he is treated badly by the staff and residents.  He reports that he was thinking about killing himself by overdosing if he did not leave the group home earlier today.  He is also having homicidal thoughts towards several members of the group home.  He reports prior history of suicide attempts.  Denies any drug or alcohol use.  Endorses compliance with his medications.  He reports that he feels that his mental health is deteriorating.  He denies any medical complaints.   Past Medical History:  Diagnosis Date  . Depression   . Diabetes mellitus without complication (HCC)   . Hyperlipidemia   . Hypertension   . Lupus anticoagulant disorder (HCC) 06/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  . PE (pulmonary thromboembolism) (HCC)   . Schizo affective schizophrenia (HCC)   . Supratherapeutic INR 11/19/2016    Patient Active Problem List   Diagnosis Date Noted  . Alcohol abuse 04/12/2018  . Schizophrenia (HCC) 10/15/2017  . 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 Tobacco use disorder 09/01/2016  . Schizoaffective disorder (HCC) 05/22/2016  . Diabetes mellitus without complication (HCC) 05/21/2016  . Hyperlipidemia 05/21/2016  . History of pulmonary embolism 05/21/2016  . Chronic anticoagulation 05/21/2016  . Hypertension 09/25/2015    History reviewed. No pertinent surgical history.  Prior to Admission medications   Medication Sig Start Date End  Date Taking? Authorizing Provider  acetaminophen (TYLENOL) 500 MG tablet Take 500 mg by mouth every 6 (six) hours as needed for mild pain or headache.    [provider]  albuterol (PROVENTIL HFA;VENTOLIN HFA) 108 (90 Base) MCG/ACT inhaler Inhale 1-2 puffs into the lungs every 4 (four) hours as needed for wheezing or shortness of breath. Patient taking differently: Inhale 1 puff into the lungs 2 (two) times daily as needed for wheezing or shortness of breath.  12/31/16   Jimmy Footman, MD  alum & mag hydroxide-simeth (MAALOX/MYLANTA) 200-200-20 MG/5ML suspension Take 30 mLs by mouth as needed for indigestion or heartburn.    [provider]  cloZAPine (CLOZARIL) 100 MG tablet Take 5 tablets (500 mg total) by mouth at bedtime. 12/03/16   Jimmy Footman, MD  ELIQUIS 5 MG TABS tablet Take 5 mg by mouth every 12 (twelve) hours. 03/02/19   [provider]  fluticasone furoate-vilanterol (BREO ELLIPTA) 200-25 MCG/INH AEPB Inhale 1 puff into the lungs daily.    [provider]  Fluticasone-Salmeterol (ADVAIR) 250-50 MCG/DOSE AEPB Inhale 1 puff into the lungs 2 (two) times daily.    [provider]  LEVEMIR FLEXTOUCH 100 UNIT/ML Pen  10/10/18   [provider]  lisinopril (PRINIVIL,ZESTRIL) 2.5 MG tablet Take 2.5 mg by mouth daily.    [provider]  lithium carbonate (ESKALITH) 450 MG CR tablet Take 1 tablet (450 mg total) by mouth every 12 (twelve) hours. 09/14/16   Jimmy Footman, MD  metFORMIN (GLUCOPHAGE) 1000 MG tablet Take 1,000 mg by mouth 2 (two) times daily with a meal.    [provider]  nicotine (NICODERM CQ - DOSED IN MG/24 HOURS) 21 mg/24hr patch Place 21 mg onto the skin daily.    [provider]  NOVOFINE AUTOCOVER 30G X 8 MM MISC  09/19/19   [provider]  Omega-3 Fatty Acids (FISH OIL) 1000 MG CAPS Take 1,000 mg by mouth daily.    [provider]  sertraline  (ZOLOFT) 50 MG tablet Take 1 tablet (50 mg total) by mouth daily. 10/22/17   McNew, Tyson Babinski, MD  simvastatin (ZOCOR) 40 MG tablet Take 40 mg by mouth at bedtime.    [provider]  traZODone (DESYREL) 100 MG tablet Take 100 mg by mouth at bedtime.     [provider]  warfarin (COUMADIN) 5 MG tablet Take 5 mg by mouth daily.     [provider]    Allergies Penicillins  Family History  Problem Relation Age of Onset  . CAD Mother   . CAD Sister     Social History Social History   Tobacco Use  . Smoking status: Current Every Day Smoker    Packs/day: 1.00    Years: 23.00    Pack years: 23.00    Types: Cigarettes  . Smokeless tobacco: Never Used  . Tobacco comment: will provide material  Substance Use Topics  . Alcohol use: No    Comment: occassionally  . Drug use: Not Currently    Types: Marijuana    Comment: Last use 11/29/16    Review of Systems  Constitutional: Negative for fever. Eyes: Negative for visual changes. ENT: Negative for sore throat. Neck: No neck pain  Cardiovascular: Negative for chest pain. Respiratory: Negative for shortness of breath. Gastrointestinal: Negative for abdominal pain, vomiting or diarrhea. Genitourinary: Negative for dysuria. Musculoskeletal: Negative for back pain. Skin: Negative for rash. Neurological: Negative for headaches, weakness or numbness. Psych: + SI and HI  ____________________________________________   PHYSICAL EXAM:  VITAL SIGNS: ED Triage Vitals  Enc Vitals Group     BP 11/10/19 2033 (!) 143/85     Pulse Rate 11/10/19 2033 97     Resp 11/10/19 2033 16     Temp 11/10/19 2033 98.4 F (36.9 C)     Temp Source 11/10/19 2033 Oral     SpO2 11/10/19 2033 97 %     Weight 11/10/19 2034 190 lb (86.2 kg)     Height 11/10/19 2034 5' 10.5" (1.791 m)     Head Circumference --      Peak Flow --      Pain Score 11/10/19 2034 0     Pain Loc --      Pain Edu? --      Excl. in [place]? --      Constitutional: Alert and oriented. Well appearing and in no apparent distress. HEENT:      Head: Normocephalic and atraumatic.         Eyes: Conjunctivae are normal. Sclera is non-icteric.       Mouth/Throat: Mucous membranes are moist.       Neck: Supple with no signs of meningismus. Cardiovascular: Regular rate and rhythm.  Respiratory: Normal respiratory effort.  Gastrointestinal: Soft, non tender, and non distended. Musculoskeletal: No edema, cyanosis, or erythema of extremities. Neurologic: Normal speech and language. Face is symmetric. Moving all extremities. No gross focal neurologic deficits are appreciated. Skin: Skin is warm, dry and intact. No rash noted. Psychiatric: Mood and affect are normal. Speech and behavior are normal.  ____________________________________________   LABS (all labs ordered  are listed, but only abnormal results are displayed)  Labs Reviewed  COMPREHENSIVE METABOLIC PANEL - Abnormal; Notable for the following components:      Result Value   Glucose, Bld 151 (*)    Calcium 10.6 (*)    All other components within normal limits  SALICYLATE LEVEL - Abnormal; Notable for the following components:   Salicylate Lvl <7.0 (*)    All other components within normal limits  ACETAMINOPHEN LEVEL - Abnormal; Notable for the following components:   Acetaminophen (Tylenol), Serum <10 (*)    All other components within normal limits  SARS CORONAVIRUS 2 BY RT PCR (HOSPITAL ORDER, PERFORMED IN [facility])  ETHANOL  CBC  URINE DRUG SCREEN, QUALITATIVE ([facility] ONLY)   ____________________________________________  EKG  none  ____________________________________________  RADIOLOGY  none  ____________________________________________   PROCEDURES  Procedure(s) performed: None Procedures Critical Care performed:  None ____________________________________________   INITIAL IMPRESSION / ASSESSMENT AND PLAN / ED COURSE  44 y.o. male  with a history of depression, schizophrenia, PE on Eliquis, hypertension, hyperlipidemia, diabetes who presents for evaluation of suicidal thoughts.  Patient reports having suicidal thoughts while in his group home because he does not like the way that he is treated.  Denies any active suicidal thoughts here.  Reports that he feels like his mental health is deteriorating.  Will consult psychiatry.  No indication for IVC as patient can contract for his safety.  Will check labs for medical clearance.  No medical complaints at this time.  Old medical records reviewed.    _________________________ 2:25 AM on 11/11/2019 -----------------------------------------  Patient evaluated by psychiatry who recommended inpatient admission.  Patient to stay voluntary as he wishes to be admitted.  Labs for medical clearance with no significant abnormalities other than mild hypercalcemia.  Will hydrate orally.  Will restart home meds.  The patient has been placed in psychiatric observation due to the need to provide a safe environment for the patient while obtaining psychiatric consultation and evaluation, as well as ongoing medical and medication management to treat the patient's condition.  The patient has not been placed under full IVC at this time.    Please note:  Patient was evaluated in Emergency Department today for the symptoms described in the history of present illness. Patient was evaluated in the context of the global COVID-19 pandemic, which necessitated consideration that the patient might be at risk for infection with the SARS-CoV-2 virus that causes COVID-19. Institutional protocols and algorithms that pertain to the evaluation of patients at risk for COVID-19 are in a state of rapid change based on information released by regulatory bodies including the CDC and federal and state organizations. These policies and algorithms were followed during the patient's care in the ED.  Some ED evaluations and  interventions may be delayed as a result of limited staffing during the pandemic.  ____________________________________________   FINAL CLINICAL IMPRESSION(S) / ED DIAGNOSES   Final diagnoses:  Suicidal ideations      NEW MEDICATIONS STARTED DURING THIS VISIT:  ED Discharge Orders    None       Note:  This document was prepared using Dragon voice recognition software and may include unintentional dictation errors.    Don Perking, [place], MD 11/11/19 [id number]    Nita Sickle, MD 11/11/19 [phone]

## 2019-11-10 NOTE — ED Notes (Addendum)
Pt dressed out of: Red and black nike sneakers Red and black watch x2 pairs black socks Yellow hat Light blue tee shirt Jeans Black belt All of the above belongings placed in pt belonging back with pt label attached.   Pt also has Red sport bag with "eat ride sleep" on the logo, pt label and green tag placed on bag. Bag put with other pt belongings.

## 2019-11-10 NOTE — ED Notes (Addendum)
Provided pt with a [place] tray and warm blankets,

## 2019-11-10 NOTE — ED Triage Notes (Signed)
Pt arrives via [employer], states he walked to police station alone because he was fed up with the group home. Pt states "they (the group home) take all my money and I can't do things".  Pt states he is "fed up and is at the point of hurting himself and others". Pt states SI & HI. Pt states he had planned to slit his wrists if "he couldn't get out of the group home". Pt states he has attempted suicide in the past.

## 2019-11-10 NOTE — ED Notes (Signed)
Legal guardian, [name], contacted by Gregor Hams. Informed that pt is here voluntarily and will be updated when more is known.

## 2019-11-11 ENCOUNTER — Encounter: Payer: Self-pay | Admitting: Psychiatric/Mental Health

## 2019-11-11 ENCOUNTER — Inpatient Hospital Stay
Admission: RE | Admit: 2019-11-11 | Discharge: 2019-11-14 | DRG: 885 | Disposition: A | Discharge status: Home / Self Care | Payer: Medicare Other | Source: Intra-hospital | Attending: Psychiatry | Admitting: Psychiatry

## 2019-11-11 DIAGNOSIS — Z8249 Family history of ischemic heart disease and other diseases of the circulatory system: Secondary | ICD-10-CM

## 2019-11-11 DIAGNOSIS — F209 Schizophrenia, unspecified: Secondary | ICD-10-CM | POA: Diagnosis present

## 2019-11-11 DIAGNOSIS — Z794 Long term (current) use of insulin: Secondary | ICD-10-CM

## 2019-11-11 DIAGNOSIS — R45851 Suicidal ideations: Secondary | ICD-10-CM | POA: Diagnosis present

## 2019-11-11 DIAGNOSIS — F5105 Insomnia due to other mental disorder: Secondary | ICD-10-CM | POA: Diagnosis present

## 2019-11-11 DIAGNOSIS — F329 Major depressive disorder, single episode, unspecified: Secondary | ICD-10-CM | POA: Diagnosis present

## 2019-11-11 DIAGNOSIS — E119 Type 2 diabetes mellitus without complications: Secondary | ICD-10-CM

## 2019-11-11 DIAGNOSIS — R45850 Homicidal ideations: Secondary | ICD-10-CM | POA: Diagnosis present

## 2019-11-11 DIAGNOSIS — F17210 Nicotine dependence, cigarettes, uncomplicated: Secondary | ICD-10-CM | POA: Diagnosis present

## 2019-11-11 DIAGNOSIS — J45909 Unspecified asthma, uncomplicated: Secondary | ICD-10-CM | POA: Diagnosis present

## 2019-11-11 DIAGNOSIS — Z79899 Other long term (current) drug therapy: Secondary | ICD-10-CM | POA: Diagnosis not present

## 2019-11-11 DIAGNOSIS — Z7901 Long term (current) use of anticoagulants: Secondary | ICD-10-CM | POA: Diagnosis not present

## 2019-11-11 DIAGNOSIS — Z7951 Long term (current) use of inhaled steroids: Secondary | ICD-10-CM | POA: Diagnosis not present

## 2019-11-11 DIAGNOSIS — Z818 Family history of other mental and behavioral disorders: Secondary | ICD-10-CM | POA: Diagnosis not present

## 2019-11-11 DIAGNOSIS — Z86711 Personal history of pulmonary embolism: Secondary | ICD-10-CM | POA: Diagnosis not present

## 2019-11-11 DIAGNOSIS — I10 Essential (primary) hypertension: Secondary | ICD-10-CM | POA: Diagnosis present

## 2019-11-11 DIAGNOSIS — E785 Hyperlipidemia, unspecified: Secondary | ICD-10-CM | POA: Diagnosis present

## 2019-11-11 DIAGNOSIS — F200 Paranoid schizophrenia: Secondary | ICD-10-CM | POA: Diagnosis not present

## 2019-11-11 DIAGNOSIS — F203 Undifferentiated schizophrenia: Secondary | ICD-10-CM | POA: Diagnosis not present

## 2019-11-11 LAB — DIFFERENTIAL
Abs Immature Granulocytes: 0.02 10*3/uL (ref 0.00–0.07)
Basophils Absolute: 0 10*3/uL (ref 0.0–0.1)
Basophils Relative: 0 %
Eosinophils Absolute: 0 10*3/uL (ref 0.0–0.5)
Eosinophils Relative: 0 %
Immature Granulocytes: 0 %
Lymphocytes Relative: 31 %
Lymphs Abs: 1.6 10*3/uL (ref 0.7–4.0)
Monocytes Absolute: 0.5 10*3/uL (ref 0.1–1.0)
Monocytes Relative: 10 %
Neutro Abs: 3.1 10*3/uL (ref 1.7–7.7)
Neutrophils Relative %: 59 %

## 2019-11-11 LAB — TSH: TSH: 3.619 u[IU]/mL (ref 0.350–4.500)

## 2019-11-11 LAB — HEMOGLOBIN A1C
Hgb A1c MFr Bld: 5.1 % (ref 4.8–5.6)
Mean Plasma Glucose: 99.67 mg/dL

## 2019-11-11 LAB — SARS CORONAVIRUS 2 BY RT PCR (HOSPITAL ORDER, PERFORMED IN ~~LOC~~ HOSPITAL LAB): SARS Coronavirus 2: NEGATIVE

## 2019-11-11 LAB — LITHIUM LEVEL: Lithium Lvl: 0.78 mmol/L (ref 0.60–1.20)

## 2019-11-11 LAB — PROTIME-INR
INR: 1.1 (ref 0.8–1.2)
Prothrombin Time: 13.4 seconds (ref 11.4–15.2)

## 2019-11-11 LAB — GLUCOSE, CAPILLARY: Glucose-Capillary: 96 mg/dL (ref 70–99)

## 2019-11-11 MED ORDER — NICOTINE 21 MG/24HR TD PT24
21.0000 mg | MEDICATED_PATCH | Freq: Every day | TRANSDERMAL | Status: DC
  Administered 2019-11-11 – 2019-11-14 (×4): 21 mg via TRANSDERMAL
  Filled 2019-11-11 (×4): qty 1

## 2019-11-11 MED ORDER — TRAZODONE HCL 100 MG PO TABS
100.0000 mg | ORAL_TABLET | Freq: Every day | ORAL | Status: DC
  Administered 2019-11-12 – 2019-11-13 (×2): 100 mg via ORAL
  Filled 2019-11-11 (×3): qty 1

## 2019-11-11 MED ORDER — LITHIUM CARBONATE ER 450 MG PO TBCR
450.0000 mg | EXTENDED_RELEASE_TABLET | Freq: Two times a day (BID) | ORAL | Status: DC
  Administered 2019-11-11 – 2019-11-14 (×6): 450 mg via ORAL
  Filled 2019-11-11 (×6): qty 1

## 2019-11-11 MED ORDER — HYDROXYZINE HCL 25 MG PO TABS: 25.0000 mg | ORAL_TABLET | Freq: Four times a day (QID) | ORAL | Status: DC | PRN

## 2019-11-11 MED ORDER — SIMVASTATIN 40 MG PO TABS
40.0000 mg | ORAL_TABLET | Freq: Every day | ORAL | Status: DC
  Administered 2019-11-11 – 2019-11-13 (×3): 40 mg via ORAL
  Filled 2019-11-11 (×3): qty 1

## 2019-11-11 MED ORDER — WARFARIN SODIUM 5 MG PO TABS: 5.0000 mg | ORAL_TABLET | Freq: Every day | ORAL | Status: DC

## 2019-11-11 MED ORDER — METFORMIN HCL 500 MG PO TABS
1000.0000 mg | ORAL_TABLET | Freq: Two times a day (BID) | ORAL | Status: DC
  Administered 2019-11-11 – 2019-11-14 (×6): 1000 mg via ORAL
  Filled 2019-11-11 (×6): qty 2

## 2019-11-11 MED ORDER — MAGNESIUM HYDROXIDE 400 MG/5ML PO SUSP: 30.0000 mL | Freq: Every day | ORAL | Status: DC | PRN

## 2019-11-11 MED ORDER — LISINOPRIL 2.5 MG PO TABS
2.5000 mg | ORAL_TABLET | Freq: Every day | ORAL | Status: DC
  Administered 2019-11-11 – 2019-11-14 (×4): 2.5 mg via ORAL
  Filled 2019-11-11 (×4): qty 1

## 2019-11-11 MED ORDER — INSULIN DETEMIR 100 UNIT/ML ~~LOC~~ SOLN
20.0000 [IU] | Freq: Two times a day (BID) | SUBCUTANEOUS | Status: DC
  Administered 2019-11-11 – 2019-11-12 (×2): 20 [IU] via SUBCUTANEOUS
  Filled 2019-11-11 (×3): qty 0.2

## 2019-11-11 MED ORDER — FLUTICASONE FUROATE-VILANTEROL 200-25 MCG/INH IN AEPB
1.0000 | INHALATION_SPRAY | Freq: Every day | RESPIRATORY_TRACT | Status: DC
  Administered 2019-11-11 – 2019-11-14 (×4): 1 via RESPIRATORY_TRACT
  Filled 2019-11-11: qty 28

## 2019-11-11 MED ORDER — APIXABAN 5 MG PO TABS
5.0000 mg | ORAL_TABLET | Freq: Two times a day (BID) | ORAL | Status: DC
  Administered 2019-11-11 – 2019-11-14 (×6): 5 mg via ORAL
  Filled 2019-11-11 (×7): qty 1

## 2019-11-11 MED ORDER — ALUM & MAG HYDROXIDE-SIMETH 200-200-20 MG/5ML PO SUSP: 30.0000 mL | ORAL | Status: DC | PRN

## 2019-11-11 MED ORDER — TRAZODONE HCL 100 MG PO TABS
100.0000 mg | ORAL_TABLET | Freq: Every evening | ORAL | Status: DC | PRN
  Administered 2019-11-11: 100 mg via ORAL

## 2019-11-11 MED ORDER — ACETAMINOPHEN 325 MG PO TABS
650.0000 mg | ORAL_TABLET | Freq: Four times a day (QID) | ORAL | Status: DC | PRN
  Administered 2019-11-12: 650 mg via ORAL
  Filled 2019-11-11: qty 2

## 2019-11-11 MED ORDER — SERTRALINE HCL 25 MG PO TABS
50.0000 mg | ORAL_TABLET | Freq: Every day | ORAL | Status: DC
  Administered 2019-11-11 – 2019-11-14 (×4): 50 mg via ORAL
  Filled 2019-11-11 (×4): qty 2

## 2019-11-11 MED ORDER — ALBUTEROL SULFATE HFA 108 (90 BASE) MCG/ACT IN AERS
1.0000 | INHALATION_SPRAY | Freq: Four times a day (QID) | RESPIRATORY_TRACT | Status: DC | PRN
  Filled 2019-11-11 (×2): qty 6.7

## 2019-11-11 MED ORDER — CLOZAPINE 100 MG PO TABS
500.0000 mg | ORAL_TABLET | Freq: Every day | ORAL | Status: DC
  Administered 2019-11-11 – 2019-11-13 (×3): 500 mg via ORAL
  Filled 2019-11-11 (×3): qty 5

## 2019-11-11 NOTE — Tx Team (Signed)
Initial Treatment Plan 11/11/2019 5:30 PM Lance [name] TEI:[id number]    PATIENT STRESSORS: Other: Living Arrangements   PATIENT STRENGTHS: Physical Health   PATIENT IDENTIFIED PROBLEMS: Group Home Issues                     DISCHARGE CRITERIA:  Improved stabilization in mood, thinking, and/or behavior  PRELIMINARY DISCHARGE PLAN: Return to previous living arrangement  PATIENT/FAMILY INVOLVEMENT: This treatment plan has been presented to and reviewed with the patient, Lance [name], and/or family member.  The patient and family have been given the opportunity to ask questions and make suggestions.  Virgina Organ, RN 11/11/2019, 5:30 PM

## 2019-11-11 NOTE — BH Assessment (Signed)
Writer spoke with patient's legal guardian Maureen Ralphs Harris-[phone]) and she gave verbal consent for the patient to be admitted to [facility].  Writer updated the patient's nurse Lattie Corns).

## 2019-11-11 NOTE — Consult Note (Signed)
MEDICATION RELATED CONSULT NOTE - INITIAL   Pharmacy Consult for Lab Monitoring and Clozapine REMs Program reporting   Labs: Recent Labs    11/10/19 2036  WBC 5.3  HGB 15.7  HCT 44.8  PLT 216  CREATININE 1.13  ALBUMIN 4.8  PROT 7.3  AST 21  ALT 20  ALKPHOS 56  BILITOT 0.6    Medical History: Past Medical History:  Diagnosis Date  . Depression   . Diabetes mellitus without complication (HCC)   . Hyperlipidemia   . Hypertension   . Lupus anticoagulant disorder (HCC) 06/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  . PE (pulmonary thromboembolism) (HCC)   . Schizo affective schizophrenia (HCC)   . Supratherapeutic INR 11/19/2016   CLOZAPINE MONITORING  Check ANC at least weekly while inpatient.  For general population patients, i.e., those without benign ethnic neutropenia (BEN): --If ANC 1000-1499, increase ANC monitoring to 3x/wk --If ANC < 1000, HOLD CLOZAPINE and get psych consult  For patients with BEN: --If ANC 500-999, increase ANC monitoring to 3x/wk --If ANC < 500, HOLD CLOZAPINE and get psych consult  REMS-certified psychiatry provider can continue drug with ANC below cited thresholds if they document medical opinion that the neutropenia is not clozapine-induced (heme consult is recommended) or that risk of interrupting therapy is greater than the risk of developing severe neutropenia.  Plan:  5/28 ANC: 3100. Lab submitted to online Clozapine REMs program. Patient eligible to receive clozapine. Every 4 week monitoring outpatient.   Per policy, will monitor ANC weekly while admitted.   Next CBC/DIFF ordered for 6/4 AM.  Gardner Candle, PharmD, BCPS Clinical Pharmacist 11/11/2019 3:31 PM

## 2019-11-11 NOTE — ED Notes (Signed)
NP and TTS at bedside. Patient admitted to room 307 Lower level BH.

## 2019-11-11 NOTE — Consult Note (Signed)
[facility] Face-to-Face Psychiatry Consult   Reason for Consult:  Psych evaluation  Referring Physician:  Dr. Don Perking Patient Identification: Shivaan Tierno MRN:  [id number] Principal Diagnosis: <principal problem not specified> Diagnosis:  Active Problems:   * No active hospital problems. *   Total Time spent with patient: 45 minutes  Subjective:   Indio Santilli is a 44 y.o. male patient admitted with per er-nurse: Pt arrives via [employer], states he walked to police station alone because he was fed up with the group home. Pt states "they (the group home) take all my money and I can't do things".  Pt states he is "fed up and is at the point of hurting himself and others". Pt states SI & HI. Pt states he had planned to slit his wrists if "he couldn't get out of the group home". Pt states he has attempted suicide in the past.   HPI:  [name], 44 y.o., male patient presented to [facility].  Patient seen face to face by TTS and this provider; chart reviewed and consulted with Dr. Lucianne Muss on 11/11/19.  On evaluation Collier Bohnet reports he walked to police station alone because he was fed up with the group home. Pt states "they (the group home) take all my money and I can't do things".   During assessment patient was alert and oriented x4, calm and cooperative. Patient reported "I started feeling suicidal and homicidal, I've been feeling like this for months." Patient currently lives in a group home and he reports that his situation is not any better "I've been there a year and a half and there are people there that are giving me a hard time." Patient reports current HI towards "the people at the group home" but was not specific on who he would hurt. Patient reports SI with a plan "to overdose on my pill." Patient does report 1 past suicide attempt by taking his pills. Patient reports AH "the voices tell me to do things like jump on people."   During evaluation Philippe Gang is sitting on the bed  engaging in conversation with security; he is alert/oriented x 4; anxious/cooperative; and mood congruent with affect.  Patient is speaking in a clear tone at moderate volume, and normal pace; with good eye contact.  His thought process is coherent and relevant; There is no indication at this time that he is currently responding to internal/external stimuli. However, he does report that he has audio and visual hallucinations.   Patient endorses suicidal/self-harm/homicidal ideation. He says he needs time to get his mind right as he is not in a good place now.  Patient has remained calm throughout assessment and has answered questions appropriately.    Past Psychiatric History: schizophrenia and depression   Risk to Self: Suicidal Ideation: Yes-Currently Present Suicidal Intent: Yes-Currently Present Is patient at risk for suicide?: Yes Suicidal Plan?: Yes-Currently Present Specify Current Suicidal Plan: "Take an overdose on pills" Access to Means: No(Patient medication is locked up at group home) What has been your use of drugs/alcohol within the last 12 months?: None How many times?: 1 Other Self Harm Risks: None Triggers for Past Attempts: Other (Comment)(Group home issues) Intentional Self Injurious Behavior: None Risk to Others: Homicidal Ideation: Yes-Currently Present Thoughts of Harm to Others: Yes-Currently Present Comment - Thoughts of Harm to Others: "hurt the people at the group home" Current Homicidal Intent: Yes-Currently Present Current Homicidal Plan: Yes-Currently Present Describe Current Homicidal Plan: "Jump on the people at the  group home" Access to Homicidal Means: Yes Describe Access to Homicidal Means: Patient capable of being physically aggressive Identified Victim: "People at the group home" History of harm to others?: No Assessment of Violence: None Noted Violent Behavior Description: None reported Does patient have access to weapons?: No Criminal Charges  Pending?: No Does patient have a court date: No Prior Inpatient Therapy: Prior Inpatient Therapy: Yes Prior Therapy Dates: 01/2018 Prior Therapy Facilty/Provider(s): [facility] Reason for Treatment: SI Prior Outpatient Therapy: Prior Outpatient Therapy: Yes Prior Therapy Dates: Currently Prior Therapy Facilty/Provider(s): Unknown Reason for Treatment: Unknown Does patient have an ACCT team?: No Does patient have Intensive In-House Services?  : No Does patient have [facility] services? : No Does patient have P4CC services?: No  Past Medical History:  Past Medical History:  Diagnosis Date  . Depression   . Diabetes mellitus without complication (HCC)   . Hyperlipidemia   . Hypertension   . Lupus anticoagulant disorder (HCC) 06/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  . PE (pulmonary thromboembolism) (HCC)   . Schizo affective schizophrenia (HCC)   . Supratherapeutic INR 11/19/2016   History reviewed. No pertinent surgical history. Family History:  Family History  Problem Relation Age of Onset  . CAD Mother   . CAD Sister    Family Psychiatric  History: unknown Social History:  Social History   Substance and Sexual Activity  Alcohol Use No   Comment: occassionally     Social History   Substance and Sexual Activity  Drug Use Not Currently  . Types: Marijuana   Comment: Last use 11/29/16    Social History   Socioeconomic History  . Marital status: Single    Spouse name: Not on file  . Number of children: Not on file  . Years of education: Not on file  . Highest education level: Not on file  Occupational History  . Not on file  Tobacco Use  . Smoking status: Current Every Day Smoker    Packs/day: 1.00    Years: 23.00    Pack years: 23.00    Types: Cigarettes  . Smokeless tobacco: Never Used  . Tobacco comment: will provide material  Substance and Sexual Activity  . Alcohol use: No    Comment: occassionally  . Drug use: Not Currently    Types: Marijuana     Comment: Last use 11/29/16  . Sexual activity: Never    Birth control/protection: None    Comment: occasional marijuana- none recently  Other Topics Concern  . Not on file  Social History Narrative   From a group home in [place]   Social Determinants of Health   Financial Resource Strain:   . Difficulty of Paying Living Expenses:   Food Insecurity:   . Worried About Programme researcher, broadcasting/film/video in the Last Year:   . Barista in the Last Year:   Transportation Needs:   . Freight forwarder (Medical):   Marland Kitchen Lack of Transportation (Non-Medical):   Physical Activity:   . Days of Exercise per Week:   . Minutes of Exercise per Session:   Stress:   . Feeling of Stress :   Social Connections:   . Frequency of Communication with Friends and Family:   . Frequency of Social Gatherings with Friends and Family:   . Attends Religious Services:   . Active Member of Clubs or Organizations:   . Attends Banker Meetings:   Marland Kitchen Marital Status:  Additional Social History:    Allergies:   Allergies  Allergen Reactions  . Penicillins Other (See Comments)    Reaction: "lockjaw" Has patient had a PCN reaction causing immediate rash, facial/tongue/throat swelling, SOB or lightheadedness with hypotension: No Has patient had a PCN reaction causing severe rash involving mucus membranes or skin necrosis: No Has patient had a PCN reaction that required hospitalization: No Has patient had a PCN reaction occurring within the last 10 years: No If all of the above answers are "NO", then may proceed with Cephalosporin use.     Labs:  Results for orders placed or performed during the hospital encounter of 11/10/19 (from the past 48 hour(s))  Comprehensive metabolic panel     Status: Abnormal   Collection Time: 11/10/19  8:36 PM  Result Value Ref Range   Sodium 139 135 - 145 mmol/L   Potassium 3.8 3.5 - 5.1 mmol/L   Chloride 106 98 - 111 mmol/L   CO2 22 22 - 32 mmol/L    Glucose, Bld 151 (H) 70 - 99 mg/dL    Comment: Glucose reference range applies only to samples taken after fasting for at least 8 hours.   BUN 17 6 - 20 mg/dL   Creatinine, Ser 1.13 0.61 - 1.24 mg/dL   Calcium 10.6 (H) 8.9 - 10.3 mg/dL   Total Protein 7.3 6.5 - 8.1 g/dL   Albumin 4.8 3.5 - 5.0 g/dL   AST 21 15 - 41 U/L   ALT 20 0 - 44 U/L   Alkaline Phosphatase 56 38 - 126 U/L   Total Bilirubin 0.6 0.3 - 1.2 mg/dL   GFR calc non Af Amer >60 >60 mL/min   GFR calc Af Amer >60 >60 mL/min   Anion gap 11 5 - 15    Comment: Performed at [facility], [address], [place], [place] [address]  Ethanol     Status: None   Collection Time: 11/10/19  8:36 PM  Result Value Ref Range   Alcohol, Ethyl (B) <10 <10 mg/dL    Comment: (NOTE) Lowest detectable limit for serum alcohol is 10 mg/dL. For medical purposes only. Performed at [facility], [address] [place], [place] [address]   Salicylate level     Status: Abnormal   Collection Time: 11/10/19  8:36 PM  Result Value Ref Range   Salicylate Lvl <3.1 (L) 7.0 - 30.0 mg/dL    Comment: Performed at [facility], [address] [place], [place] [address]  Acetaminophen level     Status: Abnormal   Collection Time: 11/10/19  8:36 PM  Result Value Ref Range   Acetaminophen (Tylenol), Serum <10 (L) 10 - 30 ug/mL    Comment: (NOTE) Therapeutic concentrations vary significantly. A range of 10-30 ug/mL  may be an effective concentration for many patients. However, some  are best treated at concentrations outside of this range. Acetaminophen concentrations >150 ug/mL at 4 hours after ingestion  and >50 ug/mL at 12 hours after ingestion are often associated with  toxic reactions. Performed at [facility], [address] [place], [place] [address]   cbc     Status: None   Collection Time: 11/10/19  8:36 PM  Result Value Ref Range   WBC 5.3 4.0 - 10.5 K/uL   RBC 4.84 4.22 - 5.81 MIL/uL    Hemoglobin 15.7 13.0 - 17.0 g/dL   HCT 44.8 39.0 - 52.0 %   MCV 92.6 80.0 - 100.0 fL  MCH 32.4 26.0 - 34.0 pg   MCHC 35.0 30.0 - 36.0 g/dL   RDW 09.8 11.9 - 14.7 %   Platelets 216 150 - 400 K/uL   nRBC 0.0 0.0 - 0.2 %    Comment: Performed at [facility], [address] [place], [place] [address]  Urine Drug Screen, Qualitative     Status: None   Collection Time: 11/10/19  8:36 PM  Result Value Ref Range   Tricyclic, Ur Screen NONE DETECTED NONE DETECTED   Amphetamines, Ur Screen NONE DETECTED NONE DETECTED   MDMA (Ecstasy)Ur Screen NONE DETECTED NONE DETECTED   Cocaine Metabolite,Ur [place] NONE DETECTED NONE DETECTED   Opiate, Ur Screen NONE DETECTED NONE DETECTED   Phencyclidine (PCP) Ur S NONE DETECTED NONE DETECTED   Cannabinoid 50 Ng, Ur [place] NONE DETECTED NONE DETECTED   Barbiturates, Ur Screen NONE DETECTED NONE DETECTED   Benzodiazepine, Ur Scrn NONE DETECTED NONE DETECTED   Methadone Scn, Ur NONE DETECTED NONE DETECTED    Comment: (NOTE) Tricyclics + metabolites, urine    Cutoff 1000 ng/mL Amphetamines + metabolites, urine  Cutoff 1000 ng/mL MDMA (Ecstasy), urine              Cutoff 500 ng/mL Cocaine Metabolite, urine          Cutoff 300 ng/mL Opiate + metabolites, urine        Cutoff 300 ng/mL Phencyclidine (PCP), urine         Cutoff 25 ng/mL Cannabinoid, urine                 Cutoff 50 ng/mL Barbiturates + metabolites, urine  Cutoff 200 ng/mL Benzodiazepine, urine              Cutoff 200 ng/mL Methadone, urine                   Cutoff 300 ng/mL The urine drug screen provides only a preliminary, unconfirmed analytical test result and should not be used for non-medical purposes. Clinical consideration and professional judgment should be applied to any positive drug screen result due to possible interfering substances. A more specific alternate chemical method must be used in order to obtain a confirmed analytical result. Gas chromatography / mass spectrometry  (GC/MS) is the preferred confirmat ory method. Performed at [facility] [address] [name], [place] [address]     No current facility-administered medications for this encounter.   Current Outpatient Medications  Medication Sig Dispense Refill  . acetaminophen (TYLENOL) 500 MG tablet Take 500 mg by mouth every 6 (six) hours as needed for mild pain or headache.    . albuterol (PROVENTIL HFA;VENTOLIN HFA) 108 (90 Base) MCG/ACT inhaler Inhale 1-2 puffs into the lungs every 4 (four) hours as needed for wheezing or shortness of breath. (Patient taking differently: Inhale 1 puff into the lungs 2 (two) times daily as needed for wheezing or shortness of breath. ) 1 Inhaler 0  . alum & mag hydroxide-simeth (MAALOX/MYLANTA) 200-200-20 MG/5ML suspension Take 30 mLs by mouth as needed for indigestion or heartburn.    . cloZAPine (CLOZARIL) 100 MG tablet Take 5 tablets (500 mg total) by mouth at bedtime. 150 tablet 0  . ELIQUIS 5 MG TABS tablet Take 5 mg by mouth every 12 (twelve) hours.    . fluticasone furoate-vilanterol (BREO ELLIPTA) 200-25 MCG/INH AEPB Inhale 1 puff into the lungs daily.    . Fluticasone-Salmeterol (ADVAIR) 250-50 MCG/DOSE AEPB Inhale 1 puff into the lungs 2 (  two) times daily.    Marland Kitchen. LEVEMIR FLEXTOUCH 100 UNIT/ML Pen     . lisinopril (PRINIVIL,ZESTRIL) 2.5 MG tablet Take 2.5 mg by mouth daily.    Marland Kitchen. lithium carbonate (ESKALITH) 450 MG CR tablet Take 1 tablet (450 mg total) by mouth every 12 (twelve) hours. 60 tablet 0  . metFORMIN (GLUCOPHAGE) 1000 MG tablet Take 1,000 mg by mouth 2 (two) times daily with a meal.    . nicotine (NICODERM CQ - DOSED IN MG/24 HOURS) 21 mg/24hr patch Place 21 mg onto the skin daily.    Marland Kitchen. NOVOFINE AUTOCOVER 30G X 8 MM MISC     . Omega-3 Fatty Acids (FISH OIL) 1000 MG CAPS Take 1,000 mg by mouth daily.    . sertraline (ZOLOFT) 50 MG tablet Take 1 tablet (50 mg total) by mouth daily. 30 tablet 0  . simvastatin (ZOCOR) 40 MG tablet Take 40 mg  by mouth at bedtime.    . traZODone (DESYREL) 100 MG tablet Take 100 mg by mouth at bedtime.     Marland Kitchen. warfarin (COUMADIN) 5 MG tablet Take 5 mg by mouth daily.       Musculoskeletal: Strength & Muscle Tone: within normal limits Gait & Station: normal Patient leans: N/A  Psychiatric Specialty Exam: Physical Exam  Nursing note and vitals reviewed. Constitutional: He is oriented to person, place, and time. He appears well-developed.  HENT:  Head: Normocephalic.  Eyes: Pupils are equal, round, and reactive to light.  Respiratory: Effort normal.  Musculoskeletal:        General: Normal range of motion.     Cervical back: Normal range of motion.  Neurological: He is alert and oriented to person, place, and time.  Skin: Skin is warm and dry.  Psychiatric: His speech is normal. His mood appears anxious. He is agitated and actively hallucinating. Cognition and memory are impaired. He expresses impulsivity and inappropriate judgment. He exhibits a depressed mood. He expresses homicidal and suicidal ideation.    Review of Systems  Psychiatric/Behavioral: Positive for agitation, dysphoric mood, hallucinations, self-injury and suicidal ideas.  All other systems reviewed and are negative.   Blood pressure (!) 143/85, pulse 97, temperature 98.4 F (36.9 C), temperature source Oral, resp. rate 16, height 5' 10.5" (1.791 m), weight 86.2 kg, SpO2 97 %.Body mass index is 26.88 kg/m.  General Appearance: Casual  Eye Contact:  Fair  Speech:  Clear and Coherent  Volume:  Normal  Mood:  Anxious, Depressed and Irritable  Affect:  Congruent  Thought Process:  Coherent and Descriptions of Associations: Circumstantial  Orientation:  Full (Time, Place, and Person)  Thought Content:  Illogical  Suicidal Thoughts:  Yes.  without intent/plan  Homicidal Thoughts:  Yes.  without intent/plan  Memory:  Recent;   Fair  Judgement:  Impaired  Insight:  Lacking  Psychomotor Activity:  Normal  Concentration:   Attention Span: Fair  Recall:  FiservFair  Fund of Knowledge:  Fair  Language:  Fair  Akathisia:  NA  Handed:  Right  AIMS (if indicated):     Assets:  Communication Skills Desire for Improvement  ADL's:  Intact  Cognition:  WNL  Sleep:        Treatment Plan Summary: Daily contact with patient to assess and evaluate symptoms and progress in treatment and Medication management  -Routine labs; which include CBC, CMP, UA, ETOH, Urine pregnancy, HCG, and UDS were reviewed  -medication management:  -Will maintain observation checks every 15 minutes for safety. -Psychosocial education regarding  relapse prevention and self-care; Social and communication  -Social work will consult with family for collateral information and discuss discharge and follow up plan.  Disposition: Recommend psychiatric Inpatient admission when medically cleared. Supportive therapy provided about ongoing stressors.  Jearld Lesch, NP 11/11/2019 3:00 AM

## 2019-11-11 NOTE — BH Assessment (Addendum)
PATIENT BED AVAILABLE AFTER 9:30AM AND PENDING NEGATIVE COVID RESULTS  Patient is to be admitted to [facility] by Psychiatric Nurse Practitioner Rishaun Dixon.  Attending Physician will be Dr. Toni Amend.   Patient has been assigned to room 307, by [facility] Charge Nurse [place].   Intake Paper Work has been signed and placed on patient chart.  ER staff is aware of the admission:  [facility] ER Secretary    Dr. Don Perking, ER MD   Dewayne Hatch Patient's Nurse   [facility] Patient Access.   Patient legal guardian [name] [phone] has been updated on patient recommendation for treatment and is receptive.
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
## 2019-11-11 NOTE — Progress Notes (Signed)
Report received from [place], [place].  Room 307 prepared for patient arrival.

## 2019-11-11 NOTE — ED Notes (Signed)
Report given to [facility] receiving nurse. Security called for escort to lower level BH.

## 2019-11-11 NOTE — Progress Notes (Signed)
Admission Note:  The patient arrived to the unit accompanied by security, with belongings and signed voluntary paperwork.  Documents were reviewed, a skin check was conducted, and belongings were secured.  The patient was cooperative and pleasant, stating, "I just can't go back to that place I was living." He endorsed thoughts of violence towards those residing in his group home, but denied homicidal/suicidal ideation while in the hospital.  Lance [name]'s mood was stable and he remained focused on ordering lunch.  A tour of the unit was provided and [name] was shown to Room 307.  15-minute safety checks initiated.

## 2019-11-11 NOTE — ED Notes (Signed)
Patient up and oob to bathroom, aware he will be going downstairs as soon as a bed is assigned. Patient calm and cooperative. Denied SI/HV/SI this morning. Safety maintained will monitor.

## 2019-11-11 NOTE — Progress Notes (Signed)
Patient Pleasant during assessment denying SI//AVH but does endorses thoughts of HI towards members of his group home. Pt states he can't go back to that place. Patient presents with a pleasant affect and was observed by this writer to be interacting appropriately with staff and peers. Patient compliant with medication administration per MD orders. Pt given education, support and encouragement to be active in his treatment plan. Patient being monitored Q 15 minutes for safety per unit protocol. Patient remains safe on the unit.

## 2019-11-11 NOTE — ED Notes (Signed)
Patient oob pacing in room. Patient updated to plan of care.

## 2019-11-11 NOTE — Plan of Care (Signed)
Patient new to the unit today, hasn't had time to progress  Problem: Education: Goal: Knowledge of [facility] Education information/materials will improve Outcome: Not Progressing Goal: Emotional status will improve Outcome: Not Progressing Goal: Mental status will improve Outcome: Not Progressing Goal: Verbalization of understanding the information provided will improve Outcome: Not Progressing   Problem: Activity: Goal: Interest or engagement in activities will improve Outcome: Not Progressing Goal: Sleeping patterns will improve Outcome: Not Progressing   Problem: Coping: Goal: Ability to verbalize frustrations and anger appropriately will improve Outcome: Not Progressing Goal: Ability to demonstrate self-control will improve Outcome: Not Progressing   

## 2019-11-12 DIAGNOSIS — F200 Paranoid schizophrenia: Secondary | ICD-10-CM

## 2019-11-12 LAB — GLUCOSE, CAPILLARY
Glucose-Capillary: 80 mg/dL (ref 70–99)
Glucose-Capillary: 115 mg/dL — ABNORMAL HIGH (ref 70–99)

## 2019-11-12 MED ORDER — INSULIN DETEMIR 100 UNIT/ML ~~LOC~~ SOLN
15.0000 [IU] | Freq: Two times a day (BID) | SUBCUTANEOUS | Status: DC
  Administered 2019-11-12 – 2019-11-13 (×2): 15 [IU] via SUBCUTANEOUS
  Filled 2019-11-12 (×3): qty 0.15

## 2019-11-12 NOTE — [facility] Counselor (Signed)
[facility] LCSW Note  11/12/2019   12:09P  Type of Contact and Topic:  PSA Completion with Collateral Information from Guardian  CSW contacted pt's legal guardian, [name], [facility], [phone] to review and obtain collateral information in order to complete PSA. Guardian confirmed pt's information and proved receptive of pt's recommendations and requests. Guardian provided contact information in order for CSW team to provide necessary ROI's.    DUDLEY MAGES, LCSWA 11/12/2019  1:08 PM

## 2019-11-12 NOTE — Progress Notes (Signed)
F - Secure housing and monitor mood.  D - Lance [name] awakened for breakfast, accepted morning medications, napped, and participated in an afternoon therapy group.  He was pleasant upon contact and his mood appeared stable.  The patient endorsed suicidal/homicidal ideation when he met with his provider (based on discontent with his housing situation).  No bizarre or unusual behaviors noted.  Lance [name] joined his peers for lunch and intermittently watched television during the afternoon.  Adequate food and fluids consumed.  Vital signs remain stable.  A - Medications provided as ordered.  Blood sugars remain controlled and Q4 BG's were ordered.  No significant changes or concerns to note.     R - Continue with care and encourage participation.

## 2019-11-12 NOTE — [facility] Group Notes (Signed)
[facility] Group Notes:  (Nursing/MHT/Case Management/Adjunct)  Date:  11/12/2019  Time:  10:04 PM  Type of Therapy:  Group Therapy  Participation Level:  Minimal  Participation Quality:  Appropriate and Left out of group to take med.  Summary of Progress/Problems:  Lance [name] 11/12/2019, 10:04 PM

## 2019-11-12 NOTE — H&P (Signed)
Psychiatric Admission Assessment Adult  Patient Identification: Lance [name] MRN:  [id number] Date of Evaluation:  11/12/2019 Chief Complaint:  MDD (major depressive disorder) [F32.9] Principal Diagnosis: <principal problem not specified> Diagnosis:  Active Problems:   MDD (major depressive disorder)  History of Present Illness: Patient is seen and examined.  Patient is a 44 year old male with a past psychiatric history significant for schizoaffective disorder; depression who arrived by [facility] police to the [facility] emergency department on 11/11/2019 secondary to suicidal and homicidal ideation.  The patient stated that he had been at the same group home over the last year and a half.  He stated he hated it there.  He stated that the people there treated him poorly.  He was paranoid about other residents in the facility.  He stated he needed to be moved to another group home.  He did admit to auditory hallucinations.  From a medical standpoint he has a history of chronic pulmonary embolism.  He apparently is followed by an ACTT service.  He also has a past medical history significant for the lupus anticoagulant disorder, hyperlipidemia, hypertension and diabetes mellitus.  His last psychiatric hospitalization at our facility was on 01/15/2018.  Similar complaints at that time.  He reported suicidal and homicidal ideation towards staff at his group home.  Similar to that admission and 2019 he complained throughout the interview today about his group home.  He stated they would not give him his money, and other complaints.  He was admitted to the hospital for evaluation and stabilization.  Associated Signs/Symptoms: Depression Symptoms:  depressed mood, anhedonia, insomnia, psychomotor agitation, difficulty concentrating, hopelessness, suicidal thoughts without plan, anxiety, loss of energy/fatigue, disturbed sleep, (Hypo) Manic Symptoms:   Delusions, Hallucinations, Impulsivity, Irritable Mood, Labiality of Mood, Anxiety Symptoms:  Excessive Worry, Psychotic Symptoms:  Delusions, Hallucinations: Auditory Paranoia, PTSD Symptoms: Negative Total Time spent with patient: 45 minutes  Past Psychiatric History: His last psychiatric hospitalization in our facility was 01/15/2018.  Similar circumstances at that time.  His discharge medications at that time were essentially the same as they are currently.  He is apparently followed by an ACTT service.  Is the patient at risk to self? Yes.    Has the patient been a risk to self in the past 6 months? Yes.    Has the patient been a risk to self within the distant past? Yes.    Is the patient a risk to others? Yes.    Has the patient been a risk to others in the past 6 months? Yes.    Has the patient been a risk to others within the distant past? Yes.     Prior Inpatient Therapy:   Prior Outpatient Therapy:    Alcohol Screening: 1. How often do you have a drink containing alcohol?: Never 2. How many drinks containing alcohol do you have on a typical day when you are drinking?: 1 or 2 3. How often do you have six or more drinks on one occasion?: Never AUDIT-C Score: 0 Alcohol Brief Interventions/Follow-up: Brief Advice Substance Abuse History in the last 12 months:  No. Consequences of Substance Abuse: Negative Previous Psychotropic Medications: Yes  Psychological Evaluations: Yes  Past Medical History:  Past Medical History:  Diagnosis Date  . Depression   . Diabetes mellitus without complication (HCC)   . Hyperlipidemia   . Hypertension   . Lupus anticoagulant disorder (HCC) 06/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  . PE (pulmonary  thromboembolism) (HCC)   . Schizo affective schizophrenia (HCC)   . Supratherapeutic INR 11/19/2016   History reviewed. No pertinent surgical history. Family History:  Family History  Problem Relation Age of Onset  . CAD Mother    . CAD Sister    Family Psychiatric  History: Reportedly some history of depression. Tobacco Screening: Have you used any form of tobacco in the last 30 days? (Cigarettes, Smokeless Tobacco, Cigars, and/or Pipes): Yes Tobacco use, Select all that apply: 5 or more cigarettes per day Are you interested in Tobacco Cessation Medications?: Yes, will notify MD for an order Counseled patient on smoking cessation including recognizing danger situations, developing coping skills and basic information about quitting provided: Yes Social History:  Social History   Substance and Sexual Activity  Alcohol Use No   Comment: occassionally     Social History   Substance and Sexual Activity  Drug Use Not Currently  . Types: Marijuana   Comment: Last use 11/29/16    Additional Social History: Marital status: Single Are you sexually active?: No What is your sexual orientation?: "None" Does patient have children?: No                         Allergies:   Allergies  Allergen Reactions  . Penicillins Other (See Comments)    Reaction: "lockjaw" Has patient had a PCN reaction causing immediate rash, facial/tongue/throat swelling, SOB or lightheadedness with hypotension: No Has patient had a PCN reaction causing severe rash involving mucus membranes or skin necrosis: No Has patient had a PCN reaction that required hospitalization: No Has patient had a PCN reaction occurring within the last 10 years: No If all of the above answers are "NO", then may proceed with Cephalosporin use.    Lab Results:  Results for orders placed or performed during the hospital encounter of 11/11/19 (from the past 48 hour(s))  Differential     Status: None   Collection Time: 11/10/19  8:36 PM  Result Value Ref Range   Neutrophils Relative % 59 %   Neutro Abs 3.1 1.7 - 7.7 K/uL   Lymphocytes Relative 31 %   Lymphs Abs 1.6 0.7 - 4.0 K/uL   Monocytes Relative 10 %   Monocytes Absolute 0.5 0.1 - 1.0 K/uL    Eosinophils Relative 0 %   Eosinophils Absolute 0.0 0.0 - 0.5 K/uL   Basophils Relative 0 %   Basophils Absolute 0.0 0.0 - 0.1 K/uL   Immature Granulocytes 0 %   Abs Immature Granulocytes 0.02 0.00 - 0.07 K/uL    Comment: Performed at [facility], [address] [place], [place] [address]  Lithium level     Status: None   Collection Time: 11/11/19  2:52 PM  Result Value Ref Range   Lithium Lvl 0.78 0.60 - 1.20 mmol/L    Comment: Performed at [facility], [address] [place], [place] [address]  Protime-INR     Status: None   Collection Time: 11/11/19  2:52 PM  Result Value Ref Range   Prothrombin Time 13.4 11.4 - 15.2 seconds   INR 1.1 0.8 - 1.2    Comment: (NOTE) INR goal varies based on device and disease states. Performed at [facility], [address] [place], [place] [address]   TSH     Status: None   Collection Time: 11/11/19  2:52 PM  Result Value Ref Range   TSH 3.619 0.350 - 4.500 uIU/mL  Comment: Performed by a 3rd Generation assay with a functional sensitivity of <=0.01 uIU/mL. Performed at [facility], [address] [place], [place] [address]   Hemoglobin A1c     Status: None   Collection Time: 11/11/19  2:52 PM  Result Value Ref Range   Hgb A1c MFr Bld 5.1 4.8 - 5.6 %    Comment: (NOTE) Pre diabetes:          5.7%-6.4% Diabetes:              >6.4% Glycemic control for   <7.0% adults with diabetes    Mean Plasma Glucose 99.67 mg/dL    Comment: Performed at [place] [address] [place], [place] [address]  Glucose, capillary     Status: None   Collection Time: 11/11/19  8:28 PM  Result Value Ref Range   Glucose-Capillary 96 70 - 99 mg/dL    Comment: Glucose reference range applies only to samples taken after fasting for at least 8 hours.  Glucose, capillary     Status: None   Collection Time: 11/12/19  7:10 AM  Result Value Ref Range   Glucose-Capillary 80 70 - 99 mg/dL    Comment: Glucose  reference range applies only to samples taken after fasting for at least 8 hours.    Blood Alcohol level:  Lab Results  Component Value Date   ETH <10 11/10/2019   ETH <10 41/32/4401    Metabolic Disorder Labs:  Lab Results  Component Value Date   HGBA1C 5.1 11/11/2019   MPG 99.67 11/11/2019   MPG 99.67 01/16/2018   Lab Results  Component Value Date   PROLACTIN 2.0 (L) 08/31/2016   PROLACTIN 4.8 05/22/2016   Lab Results  Component Value Date   CHOL 166 01/16/2018   TRIG 432 (H) 01/16/2018   HDL 30 (L) 01/16/2018   CHOLHDL 5.5 01/16/2018   VLDL UNABLE TO CALCULATE IF TRIGLYCERIDE OVER 400 mg/dL 01/16/2018   LDLCALC UNABLE TO CALCULATE IF TRIGLYCERIDE OVER 400 mg/dL 01/16/2018   LDLCALC 37 09/22/2017    Current Medications: Current Facility-Administered Medications  Medication Dose Route Frequency Provider Last Rate Last Admin  . acetaminophen (TYLENOL) tablet 650 mg  650 mg Oral Q6H PRN Dixon, Rashaun M, NP      . albuterol (VENTOLIN HFA) 108 (90 Base) MCG/ACT inhaler 1-2 puff  1-2 puff Inhalation Q6H PRN Sharma Covert, MD      . alum & mag hydroxide-simeth (MAALOX/MYLANTA) 200-200-20 MG/5ML suspension 30 mL  30 mL Oral Q4H PRN Deloria Lair, NP      . apixaban (ELIQUIS) tablet 5 mg  5 mg Oral BID Sharma Covert, MD   5 mg at 11/12/19 0855  . cloZAPine (CLOZARIL) tablet 500 mg  500 mg Oral QHS Sharma Covert, MD   500 mg at 11/11/19 2111  . fluticasone furoate-vilanterol (BREO ELLIPTA) 200-25 MCG/INH 1 puff  1 puff Inhalation Daily Sharma Covert, MD   1 puff at 11/12/19 0855  . hydrOXYzine (ATARAX/VISTARIL) tablet 25 mg  25 mg Oral Q6H PRN Deloria Lair, NP      . insulin detemir (LEVEMIR) injection 15 Units  15 Units Subcutaneous BID Sharma Covert, MD      . lisinopril (ZESTRIL) tablet 2.5 mg  2.5 mg Oral Daily Sharma Covert, MD   2.5 mg at 11/12/19 0855  . lithium carbonate (ESKALITH) CR tablet 450 mg  450 mg Oral Q12H Sharma Covert,  MD   450 mg at 11/12/19 0854  . magnesium hydroxide (MILK OF MAGNESIA) suspension 30 mL  30 mL Oral Daily PRN Dixon, Rashaun M, NP      . metFORMIN (GLUCOPHAGE) tablet 1,000 mg  1,000 mg Oral BID WC Antonieta Pert, MD   1,000 mg at 11/12/19 0854  . nicotine (NICODERM CQ - dosed in mg/24 hours) patch 21 mg  21 mg Transdermal Daily Antonieta Pert, MD   21 mg at 11/12/19 0854  . sertraline (ZOLOFT) tablet 50 mg  50 mg Oral Daily Antonieta Pert, MD   50 mg at 11/12/19 0854  . simvastatin (ZOCOR) tablet 40 mg  40 mg Oral q1800 Antonieta Pert, MD   40 mg at 11/11/19 1819  . traZODone (DESYREL) tablet 100 mg  100 mg Oral QHS Antonieta Pert, MD       PTA Medications: Medications Prior to Admission  Medication Sig Dispense Refill Last Dose  . acetaminophen (TYLENOL) 500 MG tablet Take 500 mg by mouth every 6 (six) hours as needed for mild pain or headache.     Marland Kitchen acetaminophen (TYLENOL) 650 MG CR tablet Take 650 mg by mouth every 6 (six) hours as needed for pain.     Marland Kitchen albuterol (PROVENTIL HFA;VENTOLIN HFA) 108 (90 Base) MCG/ACT inhaler Inhale 1-2 puffs into the lungs every 4 (four) hours as needed for wheezing or shortness of breath. (Patient taking differently: Inhale 1 puff into the lungs every 6 (six) hours as needed for wheezing or shortness of breath. ) 1 Inhaler 0   . alum & mag hydroxide-simeth (MAALOX/MYLANTA) 200-200-20 MG/5ML suspension Take 30 mLs by mouth as needed for indigestion or heartburn.     Marland Kitchen ammonium lactate (AMLACTIN) 12 % cream Apply 1 g topically as needed for dry skin.     . chlorhexidine (PERIDEX) 0.12 % solution Use as directed 10 mLs in the mouth or throat 2 (two) times daily.     . cloZAPine (CLOZARIL) 100 MG tablet Take 5 tablets (500 mg total) by mouth at bedtime. 150 tablet 0   . ELIQUIS 5 MG TABS tablet Take 5 mg by mouth every 12 (twelve) hours.     . fluticasone furoate-vilanterol (BREO ELLIPTA) 200-25 MCG/INH AEPB Inhale 2 puffs into the lungs  2 (two) times daily.      . Fluticasone-Salmeterol (ADVAIR) 250-50 MCG/DOSE AEPB Inhale 2 puffs into the lungs 2 (two) times daily.      Marland Kitchen ibuprofen (ADVIL) 600 MG tablet Take 600 mg by mouth every 8 (eight) hours as needed for fever or mild pain.     Marland Kitchen LEVEMIR FLEXTOUCH 100 UNIT/ML Pen Inject 20 Units into the skin daily.      Marland Kitchen lisinopril (PRINIVIL,ZESTRIL) 2.5 MG tablet Take 2.5 mg by mouth daily.     Marland Kitchen lithium carbonate (ESKALITH) 450 MG CR tablet Take 1 tablet (450 mg total) by mouth every 12 (twelve) hours. 60 tablet 0   . magnesium hydroxide (MILK OF MAGNESIA) 400 MG/5ML suspension Take 30 mLs by mouth daily as needed for mild constipation or moderate constipation.     . metFORMIN (GLUCOPHAGE) 1000 MG tablet Take 1,000 mg by mouth 2 (two) times daily with a meal.     . Omega-3 Fatty Acids (FISH OIL) 1000 MG CAPS Take 1,000 mg by mouth daily.     . sertraline (ZOLOFT) 50 MG tablet Take 1 tablet (50 mg total) by mouth daily. 30 tablet 0   . simvastatin (  ZOCOR) 40 MG tablet Take 40 mg by mouth at bedtime.     . traZODone (DESYREL) 100 MG tablet Take 100 mg by mouth at bedtime.        Musculoskeletal: Strength & Muscle Tone: within normal limits Gait & Station: normal Patient leans: N/A  Psychiatric Specialty Exam: Physical Exam  Nursing note and vitals reviewed. Constitutional: He is oriented to person, place, and time. He appears well-developed and well-nourished.  HENT:  Head: Normocephalic and atraumatic.  Respiratory: Effort normal.  Neurological: He is alert and oriented to person, place, and time.    Review of Systems  Blood pressure 132/83, pulse 70, temperature (!) 97.4 F (36.3 C), temperature source Oral, resp. rate 18, height 5\' 10"  (1.778 m), weight 86 kg, SpO2 100 %.Body mass index is 27.2 kg/m.  General Appearance: Disheveled  Eye Contact:  Minimal  Speech:  Normal Rate  Volume:  Increased  Mood:  Anxious, Dysphoric and Irritable  Affect:  Flat  Thought  Process:  Goal Directed and Descriptions of Associations: Circumstantial  Orientation:  Full (Time, Place, and Person)  Thought Content:  Delusions, Hallucinations: Auditory and Paranoid Ideation  Suicidal Thoughts:  Yes.  without intent/plan  Homicidal Thoughts:  Yes.  without intent/plan  Memory:  Immediate;   Fair Recent;   Fair Remote;   Fair  Judgement:  Impaired  Insight:  Lacking  Psychomotor Activity:  Normal  Concentration:  Concentration: Fair and Attention Span: Fair  Recall:  of Knowledge:  Fair  Language:  Fair  Akathisia:  Negative  Handed:  Right  AIMS (if indicated):     Assets:  Desire for Improvement Resilience  ADL's:  Intact  Cognition:  WNL  Sleep:  Number of Hours: 7.75    Treatment Plan Summary: Daily contact with patient to assess and evaluate symptoms and progress in treatment, Medication management and Plan : Patient is seen and examined.  Patient is a 44 year old male with the above-stated past psychiatric history who was admitted secondary to suicidal and homicidal ideation.  He will be admitted to the hospital.  He will be integrated in the milieu.  He will be encouraged to attend groups.  He will be continued on his medications that he had at the group home.  This includes clozapine, hydroxyzine, lithium carbonate, sertraline and trazodone.  We will also continue his albuterol, Eliquis, Breo, Levemir, Zestril, Metformin and  simvastatin.  Review of his laboratories included a blood sugar this a.m. of 80.  Liver function enzymes were normal.  Electrolytes were normal.  His CBC was essentially normal.  His PT and INR were 13.4 and 1.1 respectively.  His acetaminophen was less than 10, lithium was 0.78, salicylate was less than 7.  His hemoglobin A1c was 5.1.  Drug screen was negative.  Given his low blood sugar this morning I will decrease his Levemir to 15 units twice daily.  No other changes in his medications at this point.  Observation  Level/Precautions:  15 minute checks  Laboratory:  Chemistry Profile  Psychotherapy:    Medications:    Consultations:    Discharge Concerns:    Estimated LOS:  Other:     Physician Treatment Plan for Primary Diagnosis: <principal problem not specified> Long Term Goal(s): Improvement in symptoms so as ready for discharge  Short Term Goals: Ability to identify changes in lifestyle to reduce recurrence of condition will improve, Ability to verbalize feelings will improve, Ability to disclose and discuss suicidal ideas, Ability  to demonstrate self-control will improve, Ability to identify and develop effective coping behaviors will improve and Ability to maintain clinical measurements within normal limits will improve  Physician Treatment Plan for Secondary Diagnosis: Active Problems:   MDD (major depressive disorder)  Long Term Goal(s): Improvement in symptoms so as ready for discharge  Short Term Goals: Ability to identify changes in lifestyle to reduce recurrence of condition will improve, Ability to verbalize feelings will improve, Ability to disclose and discuss suicidal ideas, Ability to demonstrate self-control will improve, Ability to identify and develop effective coping behaviors will improve and Ability to maintain clinical measurements within normal limits will improve  I certify that inpatient services furnished can reasonably be expected to improve the patient's condition.    Antonieta Pert, MD 5/30/202112:27 PM

## 2019-11-12 NOTE — Plan of Care (Signed)
The patient was cooperative and pleasant.  Problem: Education: Goal: Emotional status will improve Outcome: Progressing Goal: Mental status will improve Outcome: Progressing   

## 2019-11-12 NOTE — [facility] Suicide Risk Assessment (Signed)
[facility] Admission Suicide Risk Assessment   Nursing information obtained from:  Patient Demographic factors:  Male, Caucasian, Low socioeconomic status, Unemployed Current Mental Status:  Suicidal ideation indicated by patient, Thoughts of violence towards others Loss Factors:  NA Historical Factors:  NA Risk Reduction Factors:  NA  Total Time spent with patient: 30 minutes Principal Problem: <principal problem not specified> Diagnosis:  Active Problems:   MDD (major depressive disorder)  Subjective Data: Patient is seen and examined.  Patient is a 44 year old male with a past psychiatric history significant for schizoaffective disorder; depression who [facility]11/2019 secondary to suicidal and homicidal ideation.  The patient stated that he had been at the same group home over the last year and a half.  He stated he hated it there.  He stated that the people there treated him poorly.  He was paranoid about other residents in the facility.  He stated he needed to be moved to another group home.  He did admit to auditory hallucinations.  From a medical standpoint he has a history of chronic pulmonary embolism.  He apparently is followed by an ACTT service.  He also has a past medical history significant for the lupus anticoagulant disorder, hyperlipidemia, hypertension and diabetes mellitus.  His last psychiatric hospitalization at our facility was on 01/15/2018.  Similar complaints at that time.  He reported suicidal and homicidal ideation towards staff at his group home.  Similar to that admission and 2019 he complained throughout the interview today about his group home.  He stated they would not give him his money, and other complaints.  He was admitted to the hospital for evaluation and stabilization.  Continued Clinical Symptoms:    The "Alcohol Use Disorders Identification Test", Guidelines for Use in Primary  Care, Second Edition.  World Science writer [place]). Score between 0-7:  no or low risk or alcohol related problems. Score between 8-15:  moderate risk of alcohol related problems. Score between 16-19:  high risk of alcohol related problems. Score 20 or above:  warrants further diagnostic evaluation for alcohol dependence and treatment.   CLINICAL FACTORS:   Schizophrenia:   Command hallucinatons Depressive state Paranoid or undifferentiated type   Musculoskeletal: Strength & Muscle Tone: within normal limits Gait & Station: normal Patient leans: N/A  Psychiatric Specialty Exam: Physical Exam  Nursing note and vitals reviewed. Constitutional: He is oriented to person, place, and time. He appears well-developed and well-nourished.  HENT:  Head: Normocephalic and atraumatic.  Respiratory: Effort normal.  Neurological: He is alert and oriented to person, place, and time.    Review of Systems  Blood pressure 132/83, pulse 70, temperature (!) 97.4 F (36.3 C), temperature source Oral, resp. rate 18, height 5\' 10"  (1.778 m), weight 86 kg, SpO2 100 %.Body mass index is 27.2 kg/m.  General Appearance: Disheveled  Eye Contact:  Fair  Speech:  Normal Rate  Volume:  Normal  Mood:  Anxious, Depressed, Dysphoric and Irritable  Affect:  Flat  Thought Process:  Goal Directed and Descriptions of Associations: Loose  Orientation:  Full (Time, Place, and Person)  Thought Content:  Delusions, Hallucinations: Auditory and Paranoid Ideation  Suicidal Thoughts:  Yes.  without intent/plan  Homicidal Thoughts:  Yes.  without intent/plan  Memory:  Immediate;   Fair Recent;   Fair Remote;   Fair  Judgement:  Impaired  Insight:  Lacking  Psychomotor Activity:  Normal  Concentration:  Concentration: Fair  and Attention Span: Fair  Recall:  [employer] of Knowledge:  Fair  Language:  Good  Akathisia:  Negative  Handed:  Right  AIMS (if indicated):     Assets:  Desire for  Improvement Resilience  ADL's:  Impaired  Cognition:  WNL  Sleep:  Number of Hours: 7.75      COGNITIVE FEATURES THAT CONTRIBUTE TO RISK:  Closed-mindedness and Thought constriction (tunnel vision)    SUICIDE RISK:   Minimal: No identifiable suicidal ideation.  Patients presenting with no risk factors but with morbid ruminations; may be classified as minimal risk based on the severity of the depressive symptoms  PLAN OF CARE: Patient is seen and examined.  Patient is a 44 year old male with the above-stated past psychiatric history who was admitted secondary to suicidal and homicidal ideation.  He will be admitted to the hospital.  He will be integrated in the milieu.  He will be encouraged to attend groups.  He will be continued on his medications that he had at the group home.  This includes clozapine, hydroxyzine, lithium carbonate, sertraline and trazodone.  We will also continue his albuterol, Eliquis, Breo, Levemir, Zestril, Metformin and  simvastatin.  Review of his laboratories included a blood sugar this a.m. of 80.  Liver function enzymes were normal.  Electrolytes were normal.  His CBC was essentially normal.  His PT and INR were 13.4 and 1.1 respectively.  His acetaminophen was less than 10, lithium was 8.65, salicylate was less than 7.  His hemoglobin A1c was 5.1.  Drug screen was negative.  Given his low blood sugar this morning I will decrease his Levemir to 15 units twice daily.  No other changes in his medications at this point.  I certify that inpatient services furnished can reasonably be expected to improve the patient's condition.   Sharma Covert, MD 11/12/2019, 10:14 AM

## 2019-11-12 NOTE — [facility] Group Notes (Signed)
[facility] LCSW Group Therapy Note  Date/Time:  11/12/2019 1315  Type of Therapy and Topic:  Group Therapy:  Healthy and Unhealthy Supports  Participation Level:  Active   Description of Group:  Patients in this group were introduced to the idea of adding a variety of healthy supports to address the various needs in their lives.Patients discussed what additional healthy supports could be helpful in their recovery and wellness after discharge in order to prevent future hospitalizations.   An emphasis was placed on using counselor, doctor, therapy groups, 12-step groups, and problem-specific support groups to expand supports.  They also worked as a group on developing a specific plan for several patients to deal with unhealthy supports through boundary-setting, psychoeducation with loved ones, and even termination of relationships.   Therapeutic Goals:   1)  discuss importance of adding supports to stay well once out of the hospital  2)  compare healthy versus unhealthy supports and identify some examples of each  3)  generate ideas and descriptions of healthy supports that can be added  4)  offer mutual support about how to address unhealthy supports  5)  encourage active participation in and adherence to discharge plan    Summary of Patient Progress:  The patient actively engaged in introductory check-in, reporting of being a "5, and wanting to work things out to where I don't have to come back here". Pt actively engaged in group discussion of definitions of supports, sharing his personal definition of support being "friendship". Pt too agreed with other group members definitions of supports to include friends, family, doctors, medication, and providers as various types of supports. Pt proved receptive to discussions surrounding healthy supports, unhealthy supports, and the characteristics of each. The patient expressed a willingness to add a good friend of his to his support network in  efforts to aid in his recovery journey. Pt proved receptive to alternate group members input and feedback from CSW.    Therapeutic Modalities:   Motivational Interviewing Brief Solution-Focused Therapy  NIKKI RUSNAK, LCSWA 11/12/2019  3:03 PM
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
## 2019-11-13 DIAGNOSIS — F203 Undifferentiated schizophrenia: Principal | ICD-10-CM

## 2019-11-13 LAB — GLUCOSE, CAPILLARY
Glucose-Capillary: 97 mg/dL (ref 70–99)
Glucose-Capillary: 89 mg/dL (ref 70–99)
Glucose-Capillary: 149 mg/dL — ABNORMAL HIGH (ref 70–99)

## 2019-11-13 MED ORDER — INSULIN DETEMIR 100 UNIT/ML ~~LOC~~ SOLN
10.0000 [IU] | Freq: Two times a day (BID) | SUBCUTANEOUS | Status: DC
  Administered 2019-11-13 – 2019-11-14 (×2): 10 [IU] via SUBCUTANEOUS
  Filled 2019-11-13 (×3): qty 0.1

## 2019-11-13 NOTE — Progress Notes (Signed)
   11/13/19 1345  Clinical Encounter Type  Visited With Patient  Visit Type Initial;Spiritual support;Social support;Behavioral Health  Referral From Chaplain  Consult/Referral To Chaplain  Visited Pt while rounding unit. Ch talked to Pt for a few minutes. Pt asked Ch for a Bible. I gave Pt a Bible and plan to follow-up with PT.

## 2019-11-13 NOTE — Progress Notes (Signed)
Patient blood sugar level was 107 during this encounter.  He was pleasant and cooperative.  He denies SI/HI/AVH depression and anxiety.  He received prescribed meds and tolerated without incident. He is safe with 15 minute safety rounds. Informed to contact staff with any concerns.

## 2019-11-13 NOTE — Plan of Care (Signed)
Patient denies SI / HI / AVH. Patient is minimal with assessment. Patient has little insight into admission and reports no goal. Patient asks for multiple snacks this evening, after snacks are provided at appropriate time. Patient require redirection to clean after self in milieu. Patient is adherent with scheduled medication.    Problem: Education: Goal: Knowledge of [facility] Education information/materials will improve Outcome: Not Progressing Goal: Emotional status will improve Outcome: Not Progressing Goal: Mental status will improve Outcome: Not Progressing Goal: Verbalization of understanding the information provided will improve Outcome: Not Progressing

## 2019-11-13 NOTE — Progress Notes (Signed)
[facility] MD Progress Note  11/13/2019 10:56 AM [name]  MRN:  [id number] Subjective: Follow-up for this patient well-known to our service who suffers from schizophrenia.  Patient seen chart reviewed.  Patient reports that he came to the hospital because he is fed up with his group home and wants [place] to find him a new one.  He has a list of complaints but the biggest ones seem to be 1.  They do not give him his entire $66 a month but with hold money out of it to pay for his medication.  If that is correct, I do not think that is the way it is supposed to be done but I do not know for sure what the facts are. 2.  They do not let him walk down the street to the store.  Apparently he went for a walk and the group home manager drove after him and picked him up.  Who knows what the details are around this. 3.  He "does not trust" some of the people there.  He will not be any more specific about that.  He was calm polite and not agitated in talking with me although he still does make some vague suicidal thoughts. Principal Problem: Schizophrenia, undifferentiated ([place]) Diagnosis: Principal Problem:   Schizophrenia, undifferentiated ([place]) Active Problems:   Hypertension   Diabetes mellitus without complication ([place])   History of pulmonary embolism   Chronic anticoagulation   MDD (major depressive disorder)  Total Time spent with patient: 30 minutes  Past Psychiatric History: Past history of schizophrenia longstanding.  Has been pretty stable evidently for a while now and has good act team support.  This current presentation is not unusual for him.  Does not currently appear to be truly threatening although he does have a past history of some self injury  Past Medical History:  Past Medical History:  Diagnosis Date  . Depression   . Diabetes mellitus without complication ([place])   . Hyperlipidemia   . Hypertension   . Lupus anticoagulant disorder ([place]) 06/14/2012   as per Dr.pandit's note in dec  2013.  . PE (pulmonary thromboembolism) ([place])   . Schizo affective schizophrenia ([place])   . Supratherapeutic INR 11/19/2016   History reviewed. No pertinent surgical history. Family History:  Family History  Problem Relation Age of Onset  . CAD Mother   . CAD Sister    Family Psychiatric  History: See previous Social History:  Social History   Substance and Sexual Activity  Alcohol Use No   Comment: occassionally     Social History   Substance and Sexual Activity  Drug Use Not Currently  . Types: Marijuana   Comment: Last use 11/29/16    Social History   Socioeconomic History  . Marital status: Single    Spouse name: Not on file  . Number of children: Not on file  . Years of education: Not on file  . Highest education level: Not on file  Occupational History  . Not on file  Tobacco Use  . Smoking status: Current Every Day Smoker    Packs/day: 1.00    Years: 23.00    Pack years: 23.00    Types: Cigarettes  . Smokeless tobacco: Never Used  . Tobacco comment: will provide material  Substance and Sexual Activity  . Alcohol use: No    Comment: occassionally  . Drug use: Not Currently    Types: Marijuana    Comment: Last use 11/29/16  . Sexual  activity: Never    Birth control/protection: None    Comment: occasional marijuana- none recently  Other Topics Concern  . Not on file  Social History Narrative   From a group home in [place]   Social Determinants of Health   Financial Resource Strain:   . Difficulty of Paying Living Expenses:   Food Insecurity:   . Worried About Programme researcher, broadcasting/film/video in the Last Year:   . Barista in the Last Year:   Transportation Needs:   . Freight forwarder (Medical):   Marland Kitchen Lack of Transportation (Non-Medical):   Physical Activity:   . Days of Exercise per Week:   . Minutes of Exercise per Session:   Stress:   . Feeling of Stress :   Social Connections:   . Frequency of Communication with Friends and Family:   .  Frequency of Social Gatherings with Friends and Family:   . Attends Religious Services:   . Active Member of Clubs or Organizations:   . Attends Banker Meetings:   Marland Kitchen Marital Status:    Additional Social History:                         Sleep: Fair  Appetite:  Fair  Current Medications: Current Facility-Administered Medications  Medication Dose Route Frequency Provider Last Rate Last Admin  . acetaminophen (TYLENOL) tablet 650 mg  650 mg Oral Q6H PRN Jearld Lesch, NP   650 mg at 11/12/19 1800  . albuterol (VENTOLIN HFA) 108 (90 Base) MCG/ACT inhaler 1-2 puff  1-2 puff Inhalation Q6H PRN Antonieta Pert, MD      . alum & mag hydroxide-simeth (MAALOX/MYLANTA) 200-200-20 MG/5ML suspension 30 mL  30 mL Oral Q4H PRN Dixon, Elray Buba, NP      . apixaban (ELIQUIS) tablet 5 mg  5 mg Oral BID Antonieta Pert, MD   5 mg at 11/13/19 0998  . cloZAPine (CLOZARIL) tablet 500 mg  500 mg Oral QHS Antonieta Pert, MD   500 mg at 11/12/19 2100  . fluticasone furoate-vilanterol (BREO ELLIPTA) 200-25 MCG/INH 1 puff  1 puff Inhalation Daily Antonieta Pert, MD   1 puff at 11/13/19 3382  . hydrOXYzine (ATARAX/VISTARIL) tablet 25 mg  25 mg Oral Q6H PRN Jearld Lesch, NP      . insulin detemir (LEVEMIR) injection 10 Units  10 Units Subcutaneous BID Antonieta Pert, MD      . lisinopril (ZESTRIL) tablet 2.5 mg  2.5 mg Oral Daily Antonieta Pert, MD   2.5 mg at 11/13/19 5053  . lithium carbonate (ESKALITH) CR tablet 450 mg  450 mg Oral Q12H Antonieta Pert, MD   450 mg at 11/13/19 0851  . magnesium hydroxide (MILK OF MAGNESIA) suspension 30 mL  30 mL Oral Daily PRN Dixon, Rashaun M, NP      . metFORMIN (GLUCOPHAGE) tablet 1,000 mg  1,000 mg Oral BID WC Antonieta Pert, MD   1,000 mg at 11/13/19 0851  . nicotine (NICODERM CQ - dosed in mg/24 hours) patch 21 mg  21 mg Transdermal Daily Antonieta Pert, MD   21 mg at 11/13/19 0850  . sertraline (ZOLOFT) tablet  50 mg  50 mg Oral Daily Antonieta Pert, MD   50 mg at 11/13/19 0851  . simvastatin (ZOCOR) tablet 40 mg  40 mg Oral q1800 Antonieta Pert, MD   40 mg at 11/12/19 1800  .  traZODone (DESYREL) tablet 100 mg  100 mg Oral QHS Antonieta Pert, MD   100 mg at 11/12/19 2100    Lab Results:  Results for orders placed or performed during the hospital encounter of 11/11/19 (from the past 48 hour(s))  Lithium level     Status: None   Collection Time: 11/11/19  2:52 PM  Result Value Ref Range   Lithium Lvl 0.78 0.60 - 1.20 mmol/L    Comment: Performed at [facility], [address] [place], [place] [address]  Protime-INR     Status: None   Collection Time: 11/11/19  2:52 PM  Result Value Ref Range   Prothrombin Time 13.4 11.4 - 15.2 seconds   INR 1.1 0.8 - 1.2    Comment: (NOTE) INR goal varies based on device and disease states. Performed at [facility], [address] [place], [place] [address]   TSH     Status: None   Collection Time: 11/11/19  2:52 PM  Result Value Ref Range   TSH 3.619 0.350 - 4.500 uIU/mL    Comment: Performed by a 3rd Generation assay with a functional sensitivity of <=0.01 uIU/mL. Performed at [facility], [address] [place], [place] [address]   Hemoglobin A1c     Status: None   Collection Time: 11/11/19  2:52 PM  Result Value Ref Range   Hgb A1c MFr Bld 5.1 4.8 - 5.6 %    Comment: (NOTE) Pre diabetes:          5.7%-6.4% Diabetes:              >6.4% Glycemic control for   <7.0% adults with diabetes    Mean Plasma Glucose 99.67 mg/dL    Comment: Performed at [facility], [address], [place], [place] [address]  Glucose, capillary     Status: None   Collection Time: 11/11/19  8:28 PM  Result Value Ref Range   Glucose-Capillary 96 70 - 99 mg/dL    Comment: Glucose reference range applies only to samples taken after fasting for at least 8 hours.  Glucose, capillary     Status: None   Collection Time:  11/12/19  7:10 AM  Result Value Ref Range   Glucose-Capillary 80 70 - 99 mg/dL    Comment: Glucose reference range applies only to samples taken after fasting for at least 8 hours.  Glucose, capillary     Status: Abnormal   Collection Time: 11/12/19  8:39 PM  Result Value Ref Range   Glucose-Capillary 115 (H) 70 - 99 mg/dL    Comment: Glucose reference range applies only to samples taken after fasting for at least 8 hours.   Comment 1 Notify RN   Glucose, capillary     Status: None   Collection Time: 11/13/19  6:58 AM  Result Value Ref Range   Glucose-Capillary 97 70 - 99 mg/dL    Comment: Glucose reference range applies only to samples taken after fasting for at least 8 hours.   Comment 1 Notify RN     Blood Alcohol level:  Lab Results  Component Value Date   ETH <10 11/10/2019   ETH <10 03/17/2019    Metabolic Disorder Labs: Lab Results  Component Value Date   HGBA1C 5.1 11/11/2019   MPG 99.67 11/11/2019   MPG 99.67 01/16/2018   Lab Results  Component Value Date   PROLACTIN 2.0 (L) 08/31/2016   PROLACTIN 4.8 05/22/2016   Lab Results  Component  Value Date   CHOL 166 01/16/2018   TRIG 432 (H) 01/16/2018   HDL 30 (L) 01/16/2018   CHOLHDL 5.5 01/16/2018   VLDL UNABLE TO CALCULATE IF TRIGLYCERIDE OVER 400 mg/dL 86/[birth year]1950   LDLCALC UNABLE TO CALCULATE IF TRIGLYCERIDE OVER 400 mg/dL 93/26/7124   LDLCALC 37 09/22/2017    Physical Findings: AIMS:  , ,  ,  ,    CIWA:    COWS:     Musculoskeletal: Strength & Muscle Tone: within normal limits Gait & Station: normal Patient leans: N/A  Psychiatric Specialty Exam: Physical Exam  Nursing note and vitals reviewed. Constitutional: He appears well-developed and well-nourished.  HENT:  Head: Normocephalic and atraumatic.  Eyes: Pupils are equal, round, and reactive to light. Conjunctivae are normal.  Cardiovascular: Regular rhythm and normal heart sounds.  Respiratory: Effort normal. No respiratory distress.  GI:  Soft.  Musculoskeletal:        General: Normal range of motion.     Cervical back: Normal range of motion.  Neurological: He is alert.  Skin: Skin is warm and dry.  Psychiatric: His speech is normal and behavior is normal. His affect is blunt. Thought content is paranoid. Cognition and memory are normal. He expresses impulsivity.    Review of Systems  Constitutional: Negative.   HENT: Negative.   Eyes: Negative.   Respiratory: Negative.   Cardiovascular: Negative.   Gastrointestinal: Negative.   Musculoskeletal: Negative.   Skin: Negative.   Neurological: Negative.   Psychiatric/Behavioral: Positive for dysphoric mood and suicidal ideas. Negative for self-injury. The patient is nervous/anxious.     Blood pressure 123/67, pulse 70, temperature (!) 97.5 F (36.4 C), temperature source Oral, resp. rate 18, height 5\' 10"  (1.778 m), weight 86 kg, SpO2 99 %.Body mass index is 27.2 kg/m.  General Appearance: Casual  Eye Contact:  Good  Speech:  Clear and Coherent  Volume:  Normal  Mood:  Dysphoric  Affect:  Constricted  Thought Process:  Coherent  Orientation:  Full (Time, Place, and Person)  Thought Content:  Rumination and Tangential  Suicidal Thoughts:  Yes.  without intent/plan  Homicidal Thoughts:  No  Memory:  Immediate;   Fair Recent;   Fair Remote;   Fair  Judgement:  Impaired  Insight:  Fair  Psychomotor Activity:  Decreased  Concentration:  Concentration: Fair  Recall:  of Knowledge:  Fair  Language:  Fair  Akathisia:  No  Handed:  Right  AIMS (if indicated):     Assets:  Desire for Improvement Housing Resilience  ADL's:  Intact  Cognition:  WNL  Sleep:  Number of Hours: 8     Treatment Plan Summary: Daily contact with patient to assess and evaluate symptoms and progress in treatment, Medication management and Plan He appears to be pretty much at his baseline as far as his psychiatric symptoms.  Not acting out bizarrely.  Reasonably good  self-care.  Compliant with medicine.  I offered sympathy and support and told him I would pass on his concerns but also mentioned that changes in living situation are generally handled only by guardians.  I told him I doubted we would be able to help him with the change in residence.  No change to medicine for now.  Encourage group attendance.  [employer], MD 11/13/2019, 10:56 AM

## 2019-11-13 NOTE — [facility] Group Notes (Signed)
[facility] Group Notes:  (Nursing/MHT/Case Management/Adjunct)  Date:  11/13/2019  Time:  12:15 PM  Type of Therapy:  Community Meeting  Participation Level:  Minimal  Participation Quality:  Appropriate  Affect:  Appropriate  Cognitive:  Appropriate  Insight:  Appropriate  Engagement in Group:  Engaged  Modes of Intervention:  Discussion, Education and Support  Summary of Progress/Problems:  Lance [name] 11/13/2019, 12:15 PM

## 2019-11-13 NOTE — Progress Notes (Signed)
F - Secure housing and monitor mood.  D - The patient joined others for breakfast, walked the hallways, and socialized with staff.  His mood was stable and no symptoms of psychosis observed.  Lance [name] was pleasant and cooperative.  Her briefly met with the chaplain and accepted a Bible.  Adequate food and fluids consumed.    A - No significant changes or issues to note.  R - Continue with care.

## 2019-11-14 LAB — CBC
HCT: 42.8 % (ref 39.0–52.0)
Hemoglobin: 14.5 g/dL (ref 13.0–17.0)
MCH: 31.9 pg (ref 26.0–34.0)
MCHC: 33.9 g/dL (ref 30.0–36.0)
MCV: 94.3 fL (ref 80.0–100.0)
Platelets: 196 10*3/uL (ref 150–400)
RBC: 4.54 MIL/uL (ref 4.22–5.81)
RDW: 12.5 % (ref 11.5–15.5)
WBC: 5.2 10*3/uL (ref 4.0–10.5)
nRBC: 0 % (ref 0.0–0.2)

## 2019-11-14 LAB — GLUCOSE, CAPILLARY
Glucose-Capillary: 106 mg/dL — ABNORMAL HIGH (ref 70–99)
Glucose-Capillary: 62 mg/dL — ABNORMAL LOW (ref 70–99)
Glucose-Capillary: 106 mg/dL — ABNORMAL HIGH (ref 70–99)
Glucose-Capillary: 65 mg/dL — ABNORMAL LOW (ref 70–99)

## 2019-11-14 MED ORDER — FLUTICASONE FUROATE-VILANTEROL 200-25 MCG/INH IN AEPB: 1.0000 | INHALATION_SPRAY | Freq: Every day | RESPIRATORY_TRACT | 1 refills | Status: DC

## 2019-11-14 MED ORDER — CLOZAPINE 100 MG PO TABS: 500.0000 mg | ORAL_TABLET | Freq: Every day | ORAL | 1 refills | Status: DC

## 2019-11-14 MED ORDER — LISINOPRIL 2.5 MG PO TABS: 2.5000 mg | ORAL_TABLET | Freq: Every day | ORAL | 1 refills | Status: DC

## 2019-11-14 MED ORDER — METFORMIN HCL 1000 MG PO TABS: 1000.0000 mg | ORAL_TABLET | Freq: Two times a day (BID) | ORAL | 1 refills | Status: DC

## 2019-11-14 MED ORDER — TRAZODONE HCL 100 MG PO TABS: 100.0000 mg | ORAL_TABLET | Freq: Every day | ORAL | 1 refills | Status: DC

## 2019-11-14 MED ORDER — ALBUTEROL SULFATE HFA 108 (90 BASE) MCG/ACT IN AERS: 1.0000 | INHALATION_SPRAY | Freq: Four times a day (QID) | RESPIRATORY_TRACT | 1 refills | Status: DC | PRN

## 2019-11-14 MED ORDER — APIXABAN 5 MG PO TABS: 5.0000 mg | ORAL_TABLET | Freq: Two times a day (BID) | ORAL | 1 refills | Status: DC

## 2019-11-14 MED ORDER — SERTRALINE HCL 50 MG PO TABS: 50.0000 mg | ORAL_TABLET | Freq: Every day | ORAL | 1 refills | Status: DC

## 2019-11-14 MED ORDER — INSULIN DETEMIR 100 UNIT/ML ~~LOC~~ SOLN: 10.0000 [IU] | Freq: Two times a day (BID) | SUBCUTANEOUS | 11 refills | Status: DC

## 2019-11-14 MED ORDER — SIMVASTATIN 40 MG PO TABS: 40.0000 mg | ORAL_TABLET | Freq: Every day | ORAL | 1 refills | Status: DC

## 2019-11-14 MED ORDER — LITHIUM CARBONATE ER 450 MG PO TBCR: 450.0000 mg | EXTENDED_RELEASE_TABLET | Freq: Two times a day (BID) | ORAL | 1 refills | Status: DC

## 2019-11-14 NOTE — Plan of Care (Signed)
  Problem: Safety: Goal: Periods of time without injury will increase Outcome: Progressing   

## 2019-11-14 NOTE — Tx Team (Addendum)
Interdisciplinary Treatment and Diagnostic Plan Update  11/14/2019 Time of Session: 9:00AM Zaylyn Bergdoll MRN: [id number]  Principal Diagnosis: Schizophrenia, undifferentiated (HCC)  Secondary Diagnoses: Principal Problem:   Schizophrenia, undifferentiated (HCC) Active Problems:   Hypertension   Diabetes mellitus without complication (HCC)   History of pulmonary embolism   Chronic anticoagulation   MDD (major depressive disorder)   Current Medications:  Current Facility-Administered Medications  Medication Dose Route Frequency Provider Last Rate Last Admin   acetaminophen (TYLENOL) tablet 650 mg  650 mg Oral Q6H PRN Jearld Lesch, NP   650 mg at 11/12/19 1800   albuterol (VENTOLIN HFA) 108 (90 Base) MCG/ACT inhaler 1-2 puff  1-2 puff Inhalation Q6H PRN Antonieta Pert, MD       alum & mag hydroxide-simeth (MAALOX/MYLANTA) 200-200-20 MG/5ML suspension 30 mL  30 mL Oral Q4H PRN Dixon, Rashaun M, NP       apixaban (ELIQUIS) tablet 5 mg  5 mg Oral BID Antonieta Pert, MD   5 mg at 11/14/19 0741   cloZAPine (CLOZARIL) tablet 500 mg  500 mg Oral QHS Antonieta Pert, MD   500 mg at 11/13/19 2107   fluticasone furoate-vilanterol (BREO ELLIPTA) 200-25 MCG/INH 1 puff  1 puff Inhalation Daily Antonieta Pert, MD   1 puff at 11/14/19 0743   hydrOXYzine (ATARAX/VISTARIL) tablet 25 mg  25 mg Oral Q6H PRN Jearld Lesch, NP       insulin detemir (LEVEMIR) injection 10 Units  10 Units Subcutaneous BID Antonieta Pert, MD   10 Units at 11/14/19 0742   lisinopril (ZESTRIL) tablet 2.5 mg  2.5 mg Oral Daily Antonieta Pert, MD   2.5 mg at 11/14/19 0742   lithium carbonate (ESKALITH) CR tablet 450 mg  450 mg Oral Q12H Antonieta Pert, MD   450 mg at 11/14/19 0932   magnesium hydroxide (MILK OF MAGNESIA) suspension 30 mL  30 mL Oral Daily PRN Jearld Lesch, NP       metFORMIN (GLUCOPHAGE) tablet 1,000 mg  1,000 mg Oral BID WC Antonieta Pert, MD   1,000 mg at  11/14/19 6712   nicotine (NICODERM CQ - dosed in mg/24 hours) patch 21 mg  21 mg Transdermal Daily Antonieta Pert, MD   21 mg at 11/14/19 0744   sertraline (ZOLOFT) tablet 50 mg  50 mg Oral Daily Antonieta Pert, MD   50 mg at 11/14/19 0742   simvastatin (ZOCOR) tablet 40 mg  40 mg Oral q1800 Antonieta Pert, MD   40 mg at 11/13/19 1713   traZODone (DESYREL) tablet 100 mg  100 mg Oral QHS Antonieta Pert, MD   100 mg at 11/13/19 2106   PTA Medications: Medications Prior to Admission  Medication Sig Dispense Refill Last Dose   acetaminophen (TYLENOL) 500 MG tablet Take 500 mg by mouth every 6 (six) hours as needed for mild pain or headache.      acetaminophen (TYLENOL) 650 MG CR tablet Take 650 mg by mouth every 6 (six) hours as needed for pain.      albuterol (PROVENTIL HFA;VENTOLIN HFA) 108 (90 Base) MCG/ACT inhaler Inhale 1-2 puffs into the lungs every 4 (four) hours as needed for wheezing or shortness of breath. (Patient taking differently: Inhale 1 puff into the lungs every 6 (six) hours as needed for wheezing or shortness of breath. ) 1 Inhaler 0    alum & mag hydroxide-simeth (MAALOX/MYLANTA) 200-200-20 MG/5ML suspension Take 30 mLs  by mouth as needed for indigestion or heartburn.      ammonium lactate (AMLACTIN) 12 % cream Apply 1 g topically as needed for dry skin.      chlorhexidine (PERIDEX) 0.12 % solution Use as directed 10 mLs in the mouth or throat 2 (two) times daily.      cloZAPine (CLOZARIL) 100 MG tablet Take 5 tablets (500 mg total) by mouth at bedtime. 150 tablet 0    ELIQUIS 5 MG TABS tablet Take 5 mg by mouth every 12 (twelve) hours.      fluticasone furoate-vilanterol (BREO ELLIPTA) 200-25 MCG/INH AEPB Inhale 2 puffs into the lungs 2 (two) times daily.       Fluticasone-Salmeterol (ADVAIR) 250-50 MCG/DOSE AEPB Inhale 2 puffs into the lungs 2 (two) times daily.       ibuprofen (ADVIL) 600 MG tablet Take 600 mg by mouth every 8 (eight) hours as  needed for fever or mild pain.      LEVEMIR FLEXTOUCH 100 UNIT/ML Pen Inject 20 Units into the skin daily.       lisinopril (PRINIVIL,ZESTRIL) 2.5 MG tablet Take 2.5 mg by mouth daily.      lithium carbonate (ESKALITH) 450 MG CR tablet Take 1 tablet (450 mg total) by mouth every 12 (twelve) hours. 60 tablet 0    magnesium hydroxide (MILK OF MAGNESIA) 400 MG/5ML suspension Take 30 mLs by mouth daily as needed for mild constipation or moderate constipation.      metFORMIN (GLUCOPHAGE) 1000 MG tablet Take 1,000 mg by mouth 2 (two) times daily with a meal.      Omega-3 Fatty Acids (FISH OIL) 1000 MG CAPS Take 1,000 mg by mouth daily.      sertraline (ZOLOFT) 50 MG tablet Take 1 tablet (50 mg total) by mouth daily. 30 tablet 0    simvastatin (ZOCOR) 40 MG tablet Take 40 mg by mouth at bedtime.      traZODone (DESYREL) 100 MG tablet Take 100 mg by mouth at bedtime.        Patient Stressors: Other: Living Arrangements  Patient Strengths: Physical Health  Treatment Modalities: Medication Management, Group therapy, Case management,  1 to 1 session with clinician, Psychoeducation, Recreational therapy.   Physician Treatment Plan for Primary Diagnosis: Schizophrenia, undifferentiated (HCC) Long Term Goal(s): Improvement in symptoms so as ready for discharge Improvement in symptoms so as ready for discharge   Short Term Goals: Ability to identify changes in lifestyle to reduce recurrence of condition will improve Ability to verbalize feelings will improve Ability to disclose and discuss suicidal ideas Ability to demonstrate self-control will improve Ability to identify and develop effective coping behaviors will improve Ability to maintain clinical measurements within normal limits will improve Ability to identify changes in lifestyle to reduce recurrence of condition will improve Ability to verbalize feelings will improve Ability to disclose and discuss suicidal ideas Ability to  demonstrate self-control will improve Ability to identify and develop effective coping behaviors will improve Ability to maintain clinical measurements within normal limits will improve  Medication Management: Evaluate patient's response, side effects, and tolerance of medication regimen.  Therapeutic Interventions: 1 to 1 sessions, Unit Group sessions and Medication administration.  Evaluation of Outcomes: Adequate for Discharge  Physician Treatment Plan for Secondary Diagnosis: Principal Problem:   Schizophrenia, undifferentiated (HCC) Active Problems:   Hypertension   Diabetes mellitus without complication (HCC)   History of pulmonary embolism   Chronic anticoagulation   MDD (major depressive disorder)  Long Term Goal(s): Improvement  in symptoms so as ready for discharge Improvement in symptoms so as ready for discharge   Short Term Goals: Ability to identify changes in lifestyle to reduce recurrence of condition will improve Ability to verbalize feelings will improve Ability to disclose and discuss suicidal ideas Ability to demonstrate self-control will improve Ability to identify and develop effective coping behaviors will improve Ability to maintain clinical measurements within normal limits will improve Ability to identify changes in lifestyle to reduce recurrence of condition will improve Ability to verbalize feelings will improve Ability to disclose and discuss suicidal ideas Ability to demonstrate self-control will improve Ability to identify and develop effective coping behaviors will improve Ability to maintain clinical measurements within normal limits will improve     Medication Management: Evaluate patient's response, side effects, and tolerance of medication regimen.  Therapeutic Interventions: 1 to 1 sessions, Unit Group sessions and Medication administration.  Evaluation of Outcomes: Adequate for Discharge   RN Treatment Plan for Primary Diagnosis:  Schizophrenia, undifferentiated ([place]) Long Term Goal(s): Knowledge of disease and therapeutic regimen to maintain health will improve  Short Term Goals: Ability to verbalize frustration and anger appropriately will improve, Ability to demonstrate self-control, Ability to participate in decision making will improve, Ability to verbalize feelings will improve and Ability to disclose and discuss suicidal ideas  Medication Management: RN will administer medications as ordered by provider, will assess and evaluate patient's response and provide education to patient for prescribed medication. RN will report any adverse and/or side effects to prescribing provider.  Therapeutic Interventions: 1 on 1 counseling sessions, Psychoeducation, Medication administration, Evaluate responses to treatment, Monitor vital signs and CBGs as ordered, Perform/monitor CIWA, COWS, AIMS and Fall Risk screenings as ordered, Perform wound care treatments as ordered.  Evaluation of Outcomes: Adequate for Discharge   LCSW Treatment Plan for Primary Diagnosis: Schizophrenia, undifferentiated ([place]) Long Term Goal(s): Safe transition to appropriate next level of care at discharge, Engage patient in therapeutic group addressing interpersonal concerns.  Short Term Goals: Engage patient in aftercare planning with referrals and resources, Increase social support, Increase ability to appropriately verbalize feelings, Increase emotional regulation and Increase skills for wellness and recovery  Therapeutic Interventions: Assess for all discharge needs, 1 to 1 time with Social worker, Explore available resources and support systems, Assess for adequacy in community support network, Educate family and significant other(s) on suicide prevention, Complete Psychosocial Assessment, Interpersonal group therapy.  Evaluation of Outcomes: Adequate for Discharge   Progress in Treatment: Attending groups: Yes. Participating in groups:  Yes. Taking medication as prescribed: Yes. Toleration medication: Yes. Family/Significant other contact made: Yes, individual(s) contacted:  SPE completed with legal guardian. Patient understands diagnosis: Yes. Discussing patient identified problems/goals with staff: Yes. Medical problems stabilized or resolved: Yes. Denies suicidal/homicidal ideation: Yes. Issues/concerns per patient self-inventory: No. Other: none  New problem(s) identified: No, Describe:  none  New Short Term/Long Term Goal(s): medication management for mood stabilization; elimination of SI thoughts; development of comprehensive mental wellness/sobriety plan.  Patient Goals:  "requessted that y'all help me find another group home"  Discharge Plan or Barriers: Patient's guardian reports that patient is to return to the group home.  She reports that she will assist the patient in identifying possible alternative placements.  Reason for Continuation of Hospitalization: Anxiety Depression Medication stabilization Suicidal ideation  Estimated Length of Stay:  1-7 days  Recreational Therapy: Patient: N/A Patient Goal: Patient will engage in groups without prompting or encouragement from LRT x3 group sessions within 5 recreation therapy group  sessions  Attendees: Patient: Kysen Wetherington 11/14/2019 11:37 AM  Physician: Dr. Toni Amend, MD 11/14/2019 11:37 AM  Nursing: Arsenio Loader, RN 11/14/2019 11:37 AM  RN Care Manager: 11/14/2019 11:37 AM  Social Worker: Penni Homans, LCSW 11/14/2019 11:37 AM  Recreational Therapist: Garret Reddish, Drue Flirt, LRT 11/14/2019 11:37 AM  Other: Lowella Dandy, LCSW 11/14/2019 11:37 AM  Other:  11/14/2019 11:37 AM  Other: 11/14/2019 11:37 AM    Scribe for Treatment Team: [name], LCSW 11/14/2019 11:37 AM

## 2019-11-14 NOTE — Progress Notes (Signed)
  [facility] Adult Case Management Discharge Plan :  Will you be returning to the same living situation after discharge:  Yes,  group home At discharge, do you have transportation home?: Yes,  group home will provide transportation. Do you have the ability to pay for your medications: Yes,  Medicare Part A and B  Release of information consent forms completed and in the chart;  Patient's signature needed at discharge.  Patient to Follow up at: Follow-up Information    [address] [facility] [place] Go on 11/14/2019.   Why: Your ACTT team will follow up 11/15/2019 at 1:00PM. Thanks! Contact information: [address] [place] [address] [place] [address] [phone]           Next level of care provider has access to [facility] Link:no  Safety Planning and Suicide Prevention discussed: Yes,  SPE completed with legal guardian.  Have you used any form of tobacco in the last 30 days? (Cigarettes, Smokeless Tobacco, Cigars, and/or Pipes): Yes  Has patient been referred to the Quitline?: Patient refused referral  Patient has been referred for addiction treatment: Pt. refused referral  [name], LCSW 11/14/2019, 11:51 AM

## 2019-11-14 NOTE — [facility] Group Notes (Signed)
Feelings Around Relapse 11/14/2019 9:30AM/1PM  Type of Therapy and Topic:  Group Therapy:  Feelings around Relapse and Recovery  Participation Level:  Did Not Attend   Description of Group:    Patients in this group will discuss emotions they experience before and after a relapse. They will process how experiencing these feelings, or avoidance of experiencing them, relates to having a relapse. Facilitator will guide patients to explore emotions they have related to recovery. Patients will be encouraged to process which emotions are more powerful. They will be guided to discuss the emotional reaction significant others in their lives may have to patients' relapse or recovery. Patients will be assisted in exploring ways to respond to the emotions of others without this contributing to a relapse.  Therapeutic Goals: 1. Patient will identify two or more emotions that lead to a relapse for them 2. Patient will identify two emotions that result when they relapse 3. Patient will identify two emotions related to recovery 4. Patient will demonstrate ability to communicate their needs through discussion and/or role plays   Summary of Patient Progress:     Therapeutic Modalities:   Cognitive Behavioral Therapy Solution-Focused Therapy Assertiveness Training Relapse Prevention Therapy   Suzan Slick, LCSW 11/14/2019 2:24 PM

## 2019-11-14 NOTE — Progress Notes (Signed)
D:Patient denies SI/HI/AVH, able to verbalize he can remain safe upon discharge. Pt appears calm and cooperative, and no distress noted. Pt. Denies pain.   A: All Personal items in locker returned to pt. Upon discharge.   R:  Pt States he will comply with discharge planning put into place and take MEDS as prescribed. Pt escorted out of the building by this writer/staff into the custody of assigned group home staff. Guardian notified of discharge. Pt. Did not present upon discharge with any S/S of low blood sugar.

## 2019-11-14 NOTE — [facility] Counselor (Signed)
CSW faxed paperwork to guardian and requesting a copy of guardianship paperwork.  CSW received confirmation the fax was successful.  Penni Homans, MSW, LCSW 11/14/2019 1:25 PM

## 2019-11-14 NOTE — [facility] Suicide Risk Assessment (Signed)
[facility] INPATIENT:  Family/Significant Other Suicide Prevention Education  Suicide Prevention Education:  Education Completed; [name] [employer] Guardian [phone] has been identified by the patient as the family member/significant other with whom the patient will be residing, and identified as the person(s) who will aid the patient in the event of a mental health crisis (suicidal ideations/suicide attempt).  With written consent from the patient, the family member/significant other has been provided the following suicide prevention education, prior to the and/or following the discharge of the patient.  The suicide prevention education provided includes the following:  Suicide risk factors  Suicide prevention and interventions  National Suicide Hotline telephone number  [facility] assessment telephone number  [facility] Emergency Assistance 911  [facility] and/or Residential Mobile Crisis Unit telephone number  Request made of family/significant other to:  Remove weapons (e.g., guns, rifles, knives), all items previously/currently identified as safety concern.    Remove drugs/medications (over-the-counter, prescriptions, illicit drugs), all items previously/currently identified as a safety concern.  The family member/significant other verbalizes understanding of the suicide prevention education information provided.  The family member/significant other agrees to remove the items of safety concern listed above.  CSW spoke with the patient's guardian. CSW obtained verbal permission to speak with the patient's group home and [facility] ACT team.  Guardian was unable to provide the name of the group home.  She clarified that she is not employed by [employer] but has her own private guardianship service [name].  Lance [name] 11/14/2019, 11:44 AM

## 2019-11-14 NOTE — [facility] Group Notes (Signed)
[facility] Group Notes:  (Nursing/MHT/Case Management/Adjunct)  Date:  11/14/2019  Time:  6:56 AM  Type of Therapy:  Group Therapy  Participation Level:  Did Not Attend    Lance [name] 11/14/2019, 6:56 AM

## 2019-11-14 NOTE — Plan of Care (Signed)
D: Pt. During assessments this morning observed up in the day room eating breakfast. Pt. During our engagement was pleasant and cooperative, engagement was good. Pt. Denied si/hi/avh, and verbalized ability to continue to remain safe on the unit, while pending discharge is being processed. Pt. Denied physical pain and verbalized tolerating his medicines. Pt. Did not appear to be RTIS. Pt. Affect appropriate and mood congruent. Pt. Reports doing better since arrival to unit.   A: Q x 15 minute observation checks in place/maintained for safety while discharge is being processed. Patient is provided with education throughout shift when appropriate and able, as discharge is being processed. Patient is given/offered medications per orders. Patient is encouraged to attend groups, participate in unit activities and continue with plan of care, until pending discharge complete. Pt. Chart and plans of care reviewed. Pt. Given support and encouragement when appropriate and able.    R: Patient is complaint with medication and unit procedures thus far. Pt. Observed eating good. Pt. Observed up for groups. Will continue to process patient's pending discharge.      Problem: Education: Goal: Knowledge of [facility] Education information/materials will improve Outcome: Completed/Met Goal: Emotional status will improve Outcome: Completed/Met Goal: Mental status will improve Outcome: Completed/Met Goal: Verbalization of understanding the information provided will improve Outcome: Completed/Met   Problem: Activity: Goal: Interest or engagement in activities will improve Outcome: Completed/Met Goal: Sleeping patterns will improve Outcome: Completed/Met   Problem: Coping: Goal: Ability to verbalize frustrations and anger appropriately will improve Outcome: Completed/Met Goal: Ability to demonstrate self-control will improve Outcome: Completed/Met   Problem: Health Behavior/Discharge  Planning: Goal: Identification of resources available to assist in meeting health care needs will improve Outcome: Completed/Met Goal: Compliance with treatment plan for underlying cause of condition will improve Outcome: Completed/Met   Problem: Physical Regulation: Goal: Ability to maintain clinical measurements within normal limits will improve Outcome: Completed/Met   Problem: Safety: Goal: Periods of time without injury will increase Outcome: Completed/Met   Problem: Education: Goal: Utilization of techniques to improve thought processes will improve Outcome: Completed/Met Goal: Knowledge of the prescribed therapeutic regimen will improve Outcome: Completed/Met   Problem: Activity: Goal: Interest or engagement in leisure activities will improve Outcome: Completed/Met Goal: Imbalance in normal sleep/wake cycle will improve Outcome: Completed/Met   Problem: Coping: Goal: Coping ability will improve Outcome: Completed/Met Goal: Will verbalize feelings Outcome: Completed/Met   Problem: Health Behavior/Discharge Planning: Goal: Ability to make decisions will improve Outcome: Completed/Met Goal: Compliance with therapeutic regimen will improve Outcome: Completed/Met   Problem: Role Relationship: Goal: Will demonstrate positive changes in social behaviors and relationships Outcome: Completed/Met   Problem: Safety: Goal: Ability to disclose and discuss suicidal ideas will improve Outcome: Completed/Met Goal: Ability to identify and utilize support systems that promote safety will improve Outcome: Completed/Met   Problem: Self-Concept: Goal: Will verbalize positive feelings about self Outcome: Completed/Met Goal: Level of anxiety will decrease Outcome: Completed/Met

## 2019-11-14 NOTE — Progress Notes (Signed)
  Hypoglycemic Event  CBG: 62  Treatment: 4 oz juice/soda (additional juice given per protocols) Plus patient given lunch to eat Symptoms: None  Follow-up CBG: Time: 11:48 CBG Result: 65 Follow-up CBG: Time: 12:06 CBG Result: 106  Possible Reasons for Event: Meal intake, medications, and or patient activity  Comments/MD notified: 11:30am and  12:08    Lenox Ponds

## 2019-11-14 NOTE — Discharge Summary (Signed)
Physician Discharge Summary Note  Patient:  Lance [name] is an 44 y.o., male MRN:  [id number] DOB:  [birth year] Patient phone:  [phone] (home)  Patient address:   [facility] [address] [place] [address],  Total Time spent with patient: 30 minutes  Date of Admission:  11/11/2019 Date of Discharge: November 14, 2019  Reason for Admission: Admitted through the emergency room because of symptoms of schizophrenia mood instability vague suicidal thoughts  Principal Problem: Schizophrenia, undifferentiated (HCC) Discharge Diagnoses: Principal Problem:   Schizophrenia, undifferentiated (HCC) Active Problems:   Hypertension   Diabetes mellitus without complication (HCC)   History of pulmonary embolism   Chronic anticoagulation   MDD (major depressive disorder)   Past Psychiatric History: Past history of chronic schizophrenia or schizoaffective disorder with many previous hospitalizations  Past Medical History:  Past Medical History:  Diagnosis Date  . Depression   . Diabetes mellitus without complication (HCC)   . Hyperlipidemia   . Hypertension   . Lupus anticoagulant disorder (HCC) 06/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  . PE (pulmonary thromboembolism) (HCC)   . Schizo affective schizophrenia (HCC)   . Supratherapeutic INR 11/19/2016   History reviewed. No pertinent surgical history. Family History:  Family History  Problem Relation Age of Onset  . CAD Mother   . CAD Sister    Family Psychiatric  History: See previous Social History:  Social History   Substance and Sexual Activity  Alcohol Use No   Comment: occassionally     Social History   Substance and Sexual Activity  Drug Use Not Currently  . Types: Marijuana   Comment: Last use 11/29/16    Social History   Socioeconomic History  . Marital status: Single    Spouse name: Not on file  . Number of children: Not on file  . Years of education: Not on file  . Highest  education level: Not on file  Occupational History  . Not on file  Tobacco Use  . Smoking status: Current Every Day Smoker    Packs/day: 1.00    Years: 23.00    Pack years: 23.00    Types: Cigarettes  . Smokeless tobacco: Never Used  . Tobacco comment: will provide material  Substance and Sexual Activity  . Alcohol use: No    Comment: occassionally  . Drug use: Not Currently    Types: Marijuana    Comment: Last use 11/29/16  . Sexual activity: Never    Birth control/protection: None    Comment: occasional marijuana- none recently  Other Topics Concern  . Not on file  Social History Narrative   From a group home in [place]   Social Determinants of Health   Financial Resource Strain:   . Difficulty of Paying Living Expenses:   Food Insecurity:   . Worried About Programme researcher, broadcasting/film/video in the Last Year:   . Barista in the Last Year:   Transportation Needs:   . Freight forwarder (Medical):   Marland Kitchen Lack of Transportation (Non-Medical):   Physical Activity:   . Days of Exercise per Week:   . Minutes of Exercise per Session:   Stress:   . Feeling of Stress :   Social Connections:   . Frequency of Communication with Friends and Family:   . Frequency of Social Gatherings with Friends and Family:   . Attends Religious Services:   . Active Member of Clubs or Organizations:   .  Attends Banker Meetings:   Marland Kitchen Marital Status:     Hospital Course: Admitted to the psychiatric unit.  15-minute checks maintained.  Patient showed no dangerous aggressive or violent behavior.  He was cooperative with treatment planning.  Patient was kept on his usual outpatient psychiatric medicine.  He made it clear that his [name] complaint was wanting to live in a different group home.  Supportive counseling was given to him but he was encouraged to follow-up those thoughts with his act team or his guardian.  He denied any suicidal or homicidal thought and did not appear to be acutely  dangerous.  Group home is willing to take him back.  Act team is aware of his hospitalization and willing to continue following him.  Patient will be discharged back to his regular group home with outpatient treatment as usual  Physical Findings: AIMS:  , ,  ,  ,    CIWA:    COWS:     Musculoskeletal: Strength & Muscle Tone: within normal limits Gait & Station: normal Patient leans: N/A  Psychiatric Specialty Exam: Physical Exam  Nursing note and vitals reviewed. Constitutional: He appears well-developed and well-nourished.  HENT:  Head: Normocephalic and atraumatic.  Eyes: Pupils are equal, round, and reactive to light. Conjunctivae are normal.  Cardiovascular: Regular rhythm and normal heart sounds.  Respiratory: Effort normal.  GI: Soft.  Musculoskeletal:        General: Normal range of motion.     Cervical back: Normal range of motion.  Neurological: He is alert.  Skin: Skin is warm and dry.  Psychiatric: He has a normal mood and affect. His behavior is normal. Judgment and thought content normal.    Review of Systems  Constitutional: Negative.   HENT: Negative.   Eyes: Negative.   Respiratory: Negative.   Cardiovascular: Negative.   Gastrointestinal: Negative.   Musculoskeletal: Negative.   Skin: Negative.   Neurological: Negative.   Psychiatric/Behavioral: Negative.     Blood pressure 116/73, pulse 77, temperature 98.6 F (37 C), temperature source Oral, resp. rate 17, height 5\' 10"  (1.778 m), weight 86 kg, SpO2 99 %.Body mass index is 27.2 kg/m.  General Appearance: Casual  Eye Contact:  Good  Speech:  Clear and Coherent  Volume:  Normal  Mood:  Euthymic  Affect:  Congruent  Thought Process:  Goal Directed  Orientation:  Full (Time, Place, and Person)  Thought Content:  Logical  Suicidal Thoughts:  No  Homicidal Thoughts:  No  Memory:  Immediate;   Fair Recent;   Fair Remote;   Fair  Judgement:  Fair  Insight:  Fair  Psychomotor Activity:  Normal   Concentration:  Concentration: Fair  Recall:  of Knowledge:  Fair  Language:  Fair  Akathisia:  No  Handed:  Right  AIMS (if indicated):     Assets:  Desire for Improvement Housing  ADL's:  Intact  Cognition:  WNL  Sleep:  Number of Hours: 7.75     Have you used any form of tobacco in the last 30 days? (Cigarettes, Smokeless Tobacco, Cigars, and/or Pipes): Yes  Has this patient used any form of tobacco in the last 30 days? (Cigarettes, Smokeless Tobacco, Cigars, and/or Pipes) Yes, Yes, A prescription for an FDA-approved tobacco cessation medication was offered at discharge and the patient refused  Blood Alcohol level:  Lab Results  Component Value Date   [facility] <10 11/10/2019   ETH <10 03/17/2019    Metabolic  Disorder Labs:  Lab Results  Component Value Date   HGBA1C 5.1 11/11/2019   MPG 99.67 11/11/2019   MPG 99.67 01/16/2018   Lab Results  Component Value Date   PROLACTIN 2.0 (L) 08/31/2016   PROLACTIN 4.8 05/22/2016   Lab Results  Component Value Date   CHOL 166 01/16/2018   TRIG 432 (H) 01/16/2018   HDL 30 (L) 01/16/2018   CHOLHDL 5.5 01/16/2018   VLDL UNABLE TO CALCULATE IF TRIGLYCERIDE OVER 400 mg/dL 93/26/7124   LDLCALC UNABLE TO CALCULATE IF TRIGLYCERIDE OVER 400 mg/dL 58/02/9832   LDLCALC 37 09/22/2017    See Psychiatric Specialty Exam and Suicide Risk Assessment completed by Attending Physician prior to discharge.  Discharge destination:  Home  Is patient on multiple antipsychotic therapies at discharge:  No   Has Patient had three or more failed trials of antipsychotic monotherapy by history:  No  Recommended Plan for Multiple Antipsychotic Therapies: NA  Discharge Instructions    Diet - low sodium heart healthy   Complete by: As directed    Increase activity slowly   Complete by: As directed      Allergies as of 11/14/2019      Reactions   Penicillins Other (See Comments)   Reaction: "lockjaw" Has patient had a PCN reaction  causing immediate rash, facial/tongue/throat swelling, SOB or lightheadedness with hypotension: No Has patient had a PCN reaction causing severe rash involving mucus membranes or skin necrosis: No Has patient had a PCN reaction that required hospitalization: No Has patient had a PCN reaction occurring within the last 10 years: No If all of the above answers are "NO", then may proceed with Cephalosporin use.      Medication List    STOP taking these medications   acetaminophen 500 MG tablet Commonly known as: TYLENOL   acetaminophen 650 MG CR tablet Commonly known as: TYLENOL   alum & mag hydroxide-simeth 200-200-20 MG/5ML suspension Commonly known as: MAALOX/MYLANTA   ammonium lactate 12 % cream Commonly known as: AMLACTIN   chlorhexidine 0.12 % solution Commonly known as: PERIDEX   Fish Oil 1000 MG Caps   Fluticasone-Salmeterol 250-50 MCG/DOSE Aepb Commonly known as: ADVAIR   ibuprofen 600 MG tablet Commonly known as: ADVIL   Levemir FlexTouch 100 UNIT/ML FlexPen Generic drug: insulin detemir Replaced by: insulin detemir 100 UNIT/ML injection   magnesium hydroxide 400 MG/5ML suspension Commonly known as: MILK OF MAGNESIA     TAKE these medications     Indication  albuterol 108 (90 Base) MCG/ACT inhaler Commonly known as: VENTOLIN HFA Inhale 1-2 puffs into the lungs every 6 (six) hours as needed for wheezing or shortness of breath. What changed: when to take this  Indication: Asthma   apixaban 5 MG Tabs tablet Commonly known as: ELIQUIS Take 1 tablet (5 mg total) by mouth 2 (two) times daily. What changed: when to take this  Indication: Blockage of Blood Vessel to Lung by a Particle   cloZAPine 100 MG tablet Commonly known as: CLOZARIL Take 5 tablets (500 mg total) by mouth at bedtime.  Indication: Schizophrenia   fluticasone furoate-vilanterol 200-25 MCG/INH Aepb Commonly known as: BREO ELLIPTA Inhale 1 puff into the lungs daily. Start taking on: November 15, 2019 What changed:   how much to take  when to take this  Indication: Asthma   insulin detemir 100 UNIT/ML injection Commonly known as: LEVEMIR Inject 0.1 mLs (10 Units total) into the skin 2 (two) times daily. Replaces: Levemir FlexTouch 100 UNIT/ML FlexPen  Indication: Type 2 Diabetes   lisinopril 2.5 MG tablet Commonly known as: ZESTRIL Take 1 tablet (2.5 mg total) by mouth daily.  Indication: High Blood Pressure Disorder   lithium carbonate 450 MG CR tablet Commonly known as: ESKALITH Take 1 tablet (450 mg total) by mouth every 12 (twelve) hours.  Indication: Schizoaffective Disorder   metFORMIN 1000 MG tablet Commonly known as: GLUCOPHAGE Take 1 tablet (1,000 mg total) by mouth 2 (two) times daily with a meal.  Indication: Type 2 Diabetes   sertraline 50 MG tablet Commonly known as: ZOLOFT Take 1 tablet (50 mg total) by mouth daily.  Indication: Major Depressive Disorder, Obsessive Compulsive Disorder   simvastatin 40 MG tablet Commonly known as: ZOCOR Take 1 tablet (40 mg total) by mouth daily at 6 PM. What changed: when to take this  Indication: Nonfamilial Hypercholesterolemia   traZODone 100 MG tablet Commonly known as: DESYREL Take 1 tablet (100 mg total) by mouth at bedtime.  Indication: [place].. Go on 11/14/2019.   Why: Your ACTT team will follow up 11/15/2019 at 1:00PM. Thanks! Contact information: [place] [id number] [phone]           Follow-up recommendations:  Activity:  Activity as tolerated Diet:  Regular diet Other:  Follow-up with outpatient treatment with [facility]  Comments: Discharge plan completed.  Prescriptions given at discharge  Signed: Alethia Berthold, MD 11/14/2019, 12:24 PM

## 2019-11-14 NOTE — BHH Suicide Risk Assessment (Signed)
[facility] Discharge Suicide Risk Assessment   Principal Problem: Schizophrenia, undifferentiated (HCC) Discharge Diagnoses: Principal Problem:   Schizophrenia, undifferentiated (HCC) Active Problems:   Hypertension   Diabetes mellitus without complication (HCC)   History of pulmonary embolism   Chronic anticoagulation   MDD (major depressive disorder)   Total Time spent with patient: 30 minutes  Musculoskeletal: Strength & Muscle Tone: within normal limits Gait & Station: normal Patient leans: N/A  Psychiatric Specialty Exam: Review of Systems  Constitutional: Negative.   HENT: Negative.   Eyes: Negative.   Respiratory: Negative.   Cardiovascular: Negative.   Gastrointestinal: Negative.   Musculoskeletal: Negative.   Skin: Negative.   Neurological: Negative.   Psychiatric/Behavioral: Negative.     Blood pressure 116/73, pulse 77, temperature 98.6 F (37 C), temperature source Oral, resp. rate 17, height 5\' 10"  (1.778 m), weight 86 kg, SpO2 99 %.Body mass index is 27.2 kg/m.  General Appearance: Casual  Eye Contact::  Good  Speech:  Clear and Coherent409  Volume:  Normal  Mood:  Euthymic  Affect:  Congruent  Thought Process:  Goal Directed  Orientation:  Full (Time, Place, and Person)  Thought Content:  Logical  Suicidal Thoughts:  No  Homicidal Thoughts:  No  Memory:  Immediate;   Fair Recent;   Fair Remote;   Fair  Judgement:  Fair  Insight:  Fair  Psychomotor Activity:  Normal  Concentration:  Fair  Recall:  [phone] of Knowledge:Fair  Language: Fair  Akathisia:  No  Handed:  Right  AIMS (if indicated):     Assets:  Desire for Improvement Housing  Sleep:  Number of Hours: 7.75  Cognition: WNL  ADL's:  Intact   Mental Status Per Nursing Assessment::   On Admission:  Suicidal ideation indicated by patient, Thoughts of violence towards others  Demographic Factors:  Male and Caucasian  Loss Factors: NA  Historical Factors: Impulsivity  Risk  Reduction Factors:   Living with another person, especially a relative, Positive social support and Positive therapeutic relationship  Continued Clinical Symptoms:  Schizophrenia:   Paranoid or undifferentiated type  Cognitive Features That Contribute To Risk:  Closed-mindedness    Suicide Risk:  Minimal: No identifiable suicidal ideation.  Patients presenting with no risk factors but with morbid ruminations; may be classified as minimal risk based on the severity of the depressive symptoms  Follow-up Information    [phone] [facility] Go on 11/14/2019.   Why: Your ACTT team will follow up 11/15/2019 at 1:00PM. Thanks! Contact information: [address] [place] [address] [place] [phone]           Plan Of Care/Follow-up recommendations:  Activity:  Activity as tolerated Diet:  Diabetic diet Other:  Follow-up with regular act team  [phone], MD 11/14/2019, 12:17 PM

## 2019-11-14 NOTE — Progress Notes (Signed)
Care taken over at 11pm. Pt asleep in no distress, remains safe on unit with q 15 min checks.

## 2019-11-14 NOTE — Progress Notes (Signed)
Recreation Therapy Notes   Date: 11/14/2019  Time: 9:30 am   Location: Craft room   Behavioral response: N/A   Intervention Topic: Self-care    Discussion/Intervention: Patient did not attend group.   Clinical Observations/Feedback:  Patient did not attend group.   Shaiden Aldous LRT/CTRS         Millianna Szymborski 11/14/2019 11:07 AM

## 2019-11-14 NOTE — Progress Notes (Signed)
Care taken over at 11pm. Patient in bed resting, eyes closed in no distress. Remains safe on unit with q 15 min checks

## 2019-11-23 ENCOUNTER — Other Ambulatory Visit: Payer: Self-pay | Admitting: *Deleted

## 2019-11-23 MED ORDER — FLUTICASONE FUROATE-VILANTEROL 200-25 MCG/INH IN AEPB: 1.0000 | INHALATION_SPRAY | Freq: Every day | RESPIRATORY_TRACT | 6 refills | Status: DC

## 2019-11-23 MED ORDER — SIMVASTATIN 40 MG PO TABS: 40.0000 mg | ORAL_TABLET | Freq: Every day | ORAL | 6 refills | Status: DC

## 2019-12-13 DIAGNOSIS — Z79899 Other long term (current) drug therapy: Secondary | ICD-10-CM | POA: Diagnosis not present

## 2019-12-13 DIAGNOSIS — F209 Schizophrenia, unspecified: Secondary | ICD-10-CM | POA: Diagnosis not present

## 2019-12-22 DIAGNOSIS — J45909 Unspecified asthma, uncomplicated: Secondary | ICD-10-CM | POA: Diagnosis not present

## 2019-12-22 DIAGNOSIS — F200 Paranoid schizophrenia: Secondary | ICD-10-CM | POA: Diagnosis not present

## 2019-12-25 ENCOUNTER — Ambulatory Visit: Payer: Medicare Other | Admitting: Podiatry

## 2020-01-04 ENCOUNTER — Other Ambulatory Visit: Payer: Self-pay

## 2020-01-04 ENCOUNTER — Emergency Department
Admission: EM | Admit: 2020-01-04 | Discharge: 2020-01-05 | Disposition: A | Discharge status: Home / Self Care | Payer: Medicare Other | Attending: Emergency Medicine | Admitting: Emergency Medicine

## 2020-01-04 DIAGNOSIS — Z7901 Long term (current) use of anticoagulants: Secondary | ICD-10-CM

## 2020-01-04 DIAGNOSIS — R45851 Suicidal ideations: Secondary | ICD-10-CM

## 2020-01-04 DIAGNOSIS — Z20822 Contact with and (suspected) exposure to covid-19: Secondary | ICD-10-CM | POA: Diagnosis not present

## 2020-01-04 DIAGNOSIS — F17210 Nicotine dependence, cigarettes, uncomplicated: Secondary | ICD-10-CM | POA: Insufficient documentation

## 2020-01-04 DIAGNOSIS — Z7984 Long term (current) use of oral hypoglycemic drugs: Secondary | ICD-10-CM | POA: Diagnosis not present

## 2020-01-04 DIAGNOSIS — F203 Undifferentiated schizophrenia: Secondary | ICD-10-CM | POA: Diagnosis present

## 2020-01-04 DIAGNOSIS — E119 Type 2 diabetes mellitus without complications: Secondary | ICD-10-CM | POA: Diagnosis not present

## 2020-01-04 DIAGNOSIS — Z79899 Other long term (current) drug therapy: Secondary | ICD-10-CM | POA: Insufficient documentation

## 2020-01-04 DIAGNOSIS — R4689 Other symptoms and signs involving appearance and behavior: Secondary | ICD-10-CM | POA: Diagnosis present

## 2020-01-04 DIAGNOSIS — F319 Bipolar disorder, unspecified: Secondary | ICD-10-CM | POA: Diagnosis present

## 2020-01-04 DIAGNOSIS — F329 Major depressive disorder, single episode, unspecified: Secondary | ICD-10-CM | POA: Diagnosis present

## 2020-01-04 DIAGNOSIS — F23 Brief psychotic disorder: Secondary | ICD-10-CM | POA: Diagnosis present

## 2020-01-04 DIAGNOSIS — F1010 Alcohol abuse, uncomplicated: Secondary | ICD-10-CM | POA: Diagnosis present

## 2020-01-04 DIAGNOSIS — I10 Essential (primary) hypertension: Secondary | ICD-10-CM | POA: Diagnosis not present

## 2020-01-04 DIAGNOSIS — F259 Schizoaffective disorder, unspecified: Secondary | ICD-10-CM | POA: Insufficient documentation

## 2020-01-04 DIAGNOSIS — F250 Schizoaffective disorder, bipolar type: Secondary | ICD-10-CM | POA: Diagnosis present

## 2020-01-04 DIAGNOSIS — F209 Schizophrenia, unspecified: Secondary | ICD-10-CM | POA: Diagnosis present

## 2020-01-04 LAB — COMPREHENSIVE METABOLIC PANEL
ALT: 16 U/L (ref 0–44)
AST: 18 U/L (ref 15–41)
Albumin: 5 g/dL (ref 3.5–5.0)
Alkaline Phosphatase: 51 U/L (ref 38–126)
Anion gap: 9 (ref 5–15)
BUN: 19 mg/dL (ref 6–20)
CO2: 22 mmol/L (ref 22–32)
Calcium: 10.1 mg/dL (ref 8.9–10.3)
Chloride: 111 mmol/L (ref 98–111)
Creatinine, Ser: 1.1 mg/dL (ref 0.61–1.24)
GFR calc Af Amer: >60 mL/min (ref >60)
GFR calc non Af Amer: >60 mL/min (ref >60)
Glucose, Bld: 121 mg/dL — ABNORMAL HIGH (ref 70–99)
Potassium: 3.8 mmol/L (ref 3.5–5.1)
Sodium: 142 mmol/L (ref 135–145)
Total Bilirubin: 0.7 mg/dL (ref 0.3–1.2)
Total Protein: 7.2 g/dL (ref 6.5–8.1)

## 2020-01-04 LAB — CBC
HCT: 43.1 % (ref 39.0–52.0)
Hemoglobin: 15.6 g/dL (ref 13.0–17.0)
MCH: 32.9 pg (ref 26.0–34.0)
MCHC: 36.2 g/dL — ABNORMAL HIGH (ref 30.0–36.0)
MCV: 90.9 fL (ref 80.0–100.0)
Platelets: 187 10*3/uL (ref 150–400)
RBC: 4.74 MIL/uL (ref 4.22–5.81)
RDW: 12.5 % (ref 11.5–15.5)
WBC: 4.3 10*3/uL (ref 4.0–10.5)
nRBC: 0 % (ref 0.0–0.2)

## 2020-01-04 LAB — URINE DRUG SCREEN, QUALITATIVE (ARMC ONLY)
Amphetamines, Ur Screen: NOT DETECTED
Barbiturates, Ur Screen: NOT DETECTED
Benzodiazepine, Ur Scrn: NOT DETECTED
Cannabinoid 50 Ng, Ur ~~LOC~~: NOT DETECTED
Cocaine Metabolite,Ur ~~LOC~~: NOT DETECTED
MDMA (Ecstasy)Ur Screen: NOT DETECTED
Methadone Scn, Ur: NOT DETECTED
Opiate, Ur Screen: NOT DETECTED
Phencyclidine (PCP) Ur S: NOT DETECTED
Tricyclic, Ur Screen: NOT DETECTED

## 2020-01-04 LAB — SARS CORONAVIRUS 2 BY RT PCR (HOSPITAL ORDER, PERFORMED IN ~~LOC~~ HOSPITAL LAB): SARS Coronavirus 2: NEGATIVE

## 2020-01-04 LAB — SALICYLATE LEVEL: Salicylate Lvl: <7 mg/dL — ABNORMAL LOW (ref 7.0–30.0)

## 2020-01-04 LAB — ETHANOL: Alcohol, Ethyl (B): <10 mg/dL (ref <10)

## 2020-01-04 LAB — ACETAMINOPHEN LEVEL: Acetaminophen (Tylenol), Serum: <10 ug/mL — ABNORMAL LOW (ref 10–30)

## 2020-01-04 MED ORDER — APIXABAN 5 MG PO TABS
5.0000 mg | ORAL_TABLET | Freq: Two times a day (BID) | ORAL | Status: DC
  Administered 2020-01-05: 5 mg via ORAL
  Filled 2020-01-04 (×3): qty 1

## 2020-01-04 MED ORDER — LITHIUM CARBONATE ER 450 MG PO TBCR
450.0000 mg | EXTENDED_RELEASE_TABLET | Freq: Two times a day (BID) | ORAL | Status: DC
  Administered 2020-01-05: 450 mg via ORAL
  Filled 2020-01-04 (×2): qty 1

## 2020-01-04 MED ORDER — INSULIN ASPART 100 UNIT/ML ~~LOC~~ SOLN: 0.0000 [IU] | Freq: Every day | SUBCUTANEOUS | Status: DC

## 2020-01-04 MED ORDER — INSULIN DETEMIR 100 UNIT/ML ~~LOC~~ SOLN
10.0000 [IU] | Freq: Two times a day (BID) | SUBCUTANEOUS | Status: DC
  Administered 2020-01-05: 10 [IU] via SUBCUTANEOUS
  Filled 2020-01-04 (×3): qty 0.1

## 2020-01-04 MED ORDER — FLUTICASONE FUROATE-VILANTEROL 200-25 MCG/INH IN AEPB
1.0000 | INHALATION_SPRAY | Freq: Every day | RESPIRATORY_TRACT | Status: DC
  Filled 2020-01-04: qty 28

## 2020-01-04 MED ORDER — LISINOPRIL 5 MG PO TABS
2.5000 mg | ORAL_TABLET | Freq: Every day | ORAL | Status: DC
  Administered 2020-01-05: 2.5 mg via ORAL
  Filled 2020-01-04: qty 1

## 2020-01-04 MED ORDER — TRAZODONE HCL 100 MG PO TABS
100.0000 mg | ORAL_TABLET | Freq: Every day | ORAL | Status: DC
  Filled 2020-01-04: qty 1

## 2020-01-04 MED ORDER — ALBUTEROL SULFATE (2.5 MG/3ML) 0.083% IN NEBU: 3.0000 mL | INHALATION_SOLUTION | Freq: Four times a day (QID) | RESPIRATORY_TRACT | Status: DC | PRN

## 2020-01-04 MED ORDER — CLOZAPINE 100 MG PO TABS: 500.0000 mg | ORAL_TABLET | Freq: Every day | ORAL | Status: DC

## 2020-01-04 MED ORDER — METFORMIN HCL 500 MG PO TABS
1000.0000 mg | ORAL_TABLET | Freq: Two times a day (BID) | ORAL | Status: DC
  Administered 2020-01-05: 1000 mg via ORAL
  Filled 2020-01-04: qty 2

## 2020-01-04 NOTE — ED Notes (Signed)
covid swab done and walked to lab

## 2020-01-04 NOTE — ED Notes (Signed)
Hourly rounding reveals patient in room. No complaints, stable, in no acute distress. Q15 minute rounds and monitoring via Security Cameras to continue. 

## 2020-01-04 NOTE — ED Notes (Signed)
Pt. Transferred to BHU from ED to room 3 after screening for contraband. Report to include Situation, Background, Assessment and Recommendations from [facility]. Pt. Oriented to unit including Q15 minute rounds as well as the security cameras for their protection. Patient is alert and oriented, warm and dry in no acute distress. Patient reported SI, HI, and AVH. Pt. Encouraged to let me know if needs arise.

## 2020-01-04 NOTE — ED Provider Notes (Signed)
[facility] Emergency Department Provider Note   ____________________________________________   First MD Initiated Contact with Patient 01/04/20 2222     (approximate)  I have reviewed the triage vital signs and the nursing notes.   HISTORY  Chief Complaint Behavior Problem    HPI Lance [name] is a 44 y.o. male with past medical history of schizoaffective disorder, hypertension, hyperlipidemia, diabetes, PE and lupus anticoagulant on Xarelto who presents to the ED for psychiatric evaluation.  Patient states that he has been having a difficult time at his group home, where he does not get along with the staff and other residents.  He states at times he is having "bad thoughts", including some thoughts of harming himself, but he denies any specific plan.  He denies any thoughts of harming anyone else.  He currently denies any medical complaints, has not had any issues with alcohol or drug abuse.        Past Medical History:  Diagnosis Date  . Depression   . Diabetes mellitus without complication (HCC)   . Hyperlipidemia   . Hypertension   . Lupus anticoagulant disorder (HCC) 06/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  . PE (pulmonary thromboembolism) (HCC)   . Schizo affective schizophrenia (HCC)   . Supratherapeutic INR 11/19/2016    Patient Active Problem List   Diagnosis Date Noted  . MDD (major depressive disorder) 11/11/2019  . Suicidal ideations   . Alcohol abuse 04/12/2018  . Schizophrenia (HCC) 10/15/2017  . 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 Tobacco use disorder 09/01/2016  . Schizoaffective disorder (HCC) 05/22/2016  . Diabetes mellitus without complication (HCC) 05/21/2016  . Hyperlipidemia 05/21/2016  . History of pulmonary embolism 05/21/2016  . Chronic anticoagulation 05/21/2016  . Hypertension 09/25/2015    History reviewed. No pertinent surgical history.  Prior to Admission medications    Medication Sig Start Date End Date Taking? Authorizing Provider  albuterol (VENTOLIN HFA) 108 (90 Base) MCG/ACT inhaler Inhale 1-2 puffs into the lungs every 6 (six) hours as needed for wheezing or shortness of breath. 11/14/19   Clapacs, [name], MD  apixaban (ELIQUIS) 5 MG TABS tablet Take 1 tablet (5 mg total) by mouth 2 (two) times daily. 11/14/19   Clapacs, [name], MD  cloZAPine (CLOZARIL) 100 MG tablet Take 5 tablets (500 mg total) by mouth at bedtime. 11/14/19   Clapacs, [name], MD  fluticasone furoate-vilanterol (BREO ELLIPTA) 200-25 MCG/INH AEPB Inhale 1 puff into the lungs daily. 11/23/19   Corky Downs, MD  insulin detemir (LEVEMIR) 100 UNIT/ML injection Inject 0.1 mLs (10 Units total) into the skin 2 (two) times daily. 11/14/19   Clapacs, [name], MD  lisinopril (ZESTRIL) 2.5 MG tablet Take 1 tablet (2.5 mg total) by mouth daily. 11/14/19   Clapacs, [name], MD  lithium carbonate (ESKALITH) 450 MG CR tablet Take 1 tablet (450 mg total) by mouth every 12 (twelve) hours. 11/14/19   Clapacs, [name], MD  metFORMIN (GLUCOPHAGE) 1000 MG tablet Take 1 tablet (1,000 mg total) by mouth 2 (two) times daily with a meal. 11/14/19   Clapacs, [name], MD  sertraline (ZOLOFT) 50 MG tablet Take 1 tablet (50 mg total) by mouth daily. 11/14/19   Clapacs, [name], MD  simvastatin (ZOCOR) 40 MG tablet Take 1 tablet (40 mg total) by mouth daily at 6 PM. 11/23/19   Corky Downs, MD  traZODone (DESYREL) 100 MG tablet Take 1 tablet (100 mg total) by mouth  at bedtime. 11/14/19   Clapacs, [name], MD    Allergies Penicillins  Family History  Problem Relation Age of Onset  . CAD Mother   . CAD Sister     Social History Social History   Tobacco Use  . Smoking status: Current Every Day Smoker    Packs/day: 1.00    Years: 23.00    Pack years: 23.00    Types: Cigarettes  . Smokeless tobacco: Never Used  . Tobacco comment: will provide material  Substance Use Topics  . Alcohol use: No    Comment: occassionally  . Drug  use: Not Currently    Types: Marijuana    Comment: Last use 11/29/16    Review of Systems  Constitutional: No fever/chills Eyes: No visual changes. ENT: No sore throat. Cardiovascular: Denies chest pain. Respiratory: Denies shortness of breath. Gastrointestinal: No abdominal pain.  No nausea, no vomiting.  No diarrhea.  No constipation. Genitourinary: Negative for dysuria. Musculoskeletal: Negative for back pain. Skin: Negative for rash. Neurological: Negative for headaches, focal weakness or numbness.  Positive for depression and suicidal ideation.  ____________________________________________   PHYSICAL EXAM:  VITAL SIGNS: ED Triage Vitals  Enc Vitals Group     BP 01/04/20 2110 (!) 88/69     Pulse Rate 01/04/20 2110 (!) 107     Resp 01/04/20 2218 18     Temp 01/04/20 2110 98.1 F (36.7 C)     Temp Source 01/04/20 2110 Oral     SpO2 01/04/20 2110 98 %     Weight 01/04/20 2111 200 lb (90.7 kg)     Height 01/04/20 2111 5' 10.5" (1.791 m)     Head Circumference --      Peak Flow --      Pain Score 01/04/20 2111 0     Pain Loc --      Pain Edu? --      Excl. in GC? --     Constitutional: Alert and oriented. Eyes: Conjunctivae are normal. Head: Atraumatic. Nose: No congestion/rhinnorhea. Mouth/Throat: Mucous membranes are moist. Neck: Normal ROM Cardiovascular: Normal rate, regular rhythm. Grossly normal heart sounds. Respiratory: Normal respiratory effort.  No retractions. Lungs CTAB. Gastrointestinal: Soft and nontender. No distention. Genitourinary: deferred Musculoskeletal: No lower extremity tenderness nor edema. Neurologic:  Normal speech and language. No gross focal neurologic deficits are appreciated. Skin:  Skin is warm, dry and intact. No rash noted. Psychiatric: Mood and affect are normal. Speech and behavior are normal.  ____________________________________________   LABS (all labs ordered are listed, but only abnormal results are displayed)  Labs  Reviewed  COMPREHENSIVE METABOLIC PANEL - Abnormal; Notable for the following components:      Result Value   Glucose, Bld 121 (*)    All other components within normal limits  SALICYLATE LEVEL - Abnormal; Notable for the following components:   Salicylate Lvl <7.0 (*)    All other components within normal limits  ACETAMINOPHEN LEVEL - Abnormal; Notable for the following components:   Acetaminophen (Tylenol), Serum <10 (*)    All other components within normal limits  CBC - Abnormal; Notable for the following components:   MCHC 36.2 (*)    All other components within normal limits  SARS CORONAVIRUS 2 BY RT PCR (HOSPITAL ORDER, PERFORMED IN [facility])  URINE DRUG SCREEN, QUALITATIVE ([facility] ONLY)  ETHANOL     PROCEDURES  Procedure(s) performed (including Critical Care):  Procedures   ____________________________________________   INITIAL IMPRESSION / ASSESSMENT AND PLAN /  ED COURSE       44 year old male with past medical history of hypertension, hyperlipidemia, PE and lupus anticoagulant on Xarelto who presents to the ED complaining of difficulty getting along at his group home with vague suicidal ideation.  He is calm and cooperative here in the ED and we will hold off on IVC.  He denies any medical complaints and screening labs are unremarkable, he is medically cleared for psychiatric evaluation.      ____________________________________________   FINAL CLINICAL IMPRESSION(S) / ED DIAGNOSES  Final diagnoses:  Depression, unspecified depression type  Suicidal ideation     ED Discharge Orders    None       Note:  This document was prepared using Dragon voice recognition software and may include unintentional dictation errors.   Chesley Noon, MD 01/04/20 2234

## 2020-01-04 NOTE — ED Triage Notes (Signed)
Pt to the er for SI and HI. Pt states that the people at his group home aren't making anything easier. Pt states he walked to the police dept and they dropped him here. Pt says he has a hard time coping with his problems.

## 2020-01-04 NOTE — ED Notes (Signed)
This RN and Dr Larinda Buttery out to triage to assess pt. Pt calm and cooperative. Pt cleared for placement into BHU by MD. Report to [facility], RN in ED BHU. Pt escorted to [facility] via wheelchair by this RN and [employer].

## 2020-01-05 ENCOUNTER — Other Ambulatory Visit: Payer: Self-pay | Admitting: Podiatry

## 2020-01-05 ENCOUNTER — Other Ambulatory Visit (HOSPITAL_COMMUNITY): Payer: Self-pay | Admitting: Psychiatry

## 2020-01-05 LAB — CBC WITH DIFFERENTIAL/PLATELET
Abs Immature Granulocytes: 0.01 10*3/uL (ref 0.00–0.07)
Basophils Absolute: 0 10*3/uL (ref 0.0–0.1)
Basophils Relative: 0 %
Eosinophils Absolute: 0 10*3/uL (ref 0.0–0.5)
Eosinophils Relative: 0 %
HCT: 45.1 % (ref 39.0–52.0)
Hemoglobin: 15.4 g/dL (ref 13.0–17.0)
Immature Granulocytes: 0 %
Lymphocytes Relative: 37 %
Lymphs Abs: 1.5 10*3/uL (ref 0.7–4.0)
MCH: 31.9 pg (ref 26.0–34.0)
MCHC: 34.1 g/dL (ref 30.0–36.0)
MCV: 93.4 fL (ref 80.0–100.0)
Monocytes Absolute: 0.6 10*3/uL (ref 0.1–1.0)
Monocytes Relative: 14 %
Neutro Abs: 2 10*3/uL (ref 1.7–7.7)
Neutrophils Relative %: 49 %
Platelets: 201 10*3/uL (ref 150–400)
RBC: 4.83 MIL/uL (ref 4.22–5.81)
RDW: 12.3 % (ref 11.5–15.5)
WBC: 4.2 10*3/uL (ref 4.0–10.5)
nRBC: 0 % (ref 0.0–0.2)

## 2020-01-05 LAB — GLUCOSE, CAPILLARY: Glucose-Capillary: 108 mg/dL — ABNORMAL HIGH (ref 70–99)

## 2020-01-05 NOTE — ED Notes (Signed)
Hourly rounding reveals patient in room. No complaints, stable, in no acute distress. Q15 minute rounds and monitoring via Security Cameras to continue. 

## 2020-01-05 NOTE — Consult Note (Signed)
[facility] Psych ED Discharge  01/05/2020 11:09 AM Lance [name]  MRN:  [id number] Principal Problem: Schizophrenia, undifferentiated [facility]) Discharge Diagnoses: Principal Problem:   Schizophrenia, undifferentiated (HCC) Active Problems:   Diabetes mellitus without complication (HCC)   Chronic anticoagulation   Schizoaffective disorder (HCC)   Schizophrenia (HCC)   Alcohol abuse   Suicidal ideations   MDD (major depressive disorder)   Subjective: " I am alright."  Patient seen and evaluated in person by this provider.  He reports he got irritated his group home yesterday at the residents and staff.  He has been at this group home for 1-1/2 years.  Denies suicidal/homicidal ideations, hallucinations, substance abuse, and other concerning psychiatric issues.  His group home was contacted) and the owner reports he evidently called the police without any indicator.  She reports he manipulates the other clients to get things that he wants like cigarettes and other special treats.  She has no safety concerns about him returning to the facility and agreeable for him to return.  Psychiatrically cleared for discharge  Total Time spent with patient: 45 minutes  Past Psychiatric History: schizoaffective d/o, substance use  Past Medical History:  Past Medical History:  Diagnosis Date  . Depression   . Diabetes mellitus without complication (HCC)   . Hyperlipidemia   . Hypertension   . Lupus anticoagulant disorder (HCC) 06/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  . PE (pulmonary thromboembolism) (HCC)   . Schizo affective schizophrenia (HCC)   . Supratherapeutic INR 11/19/2016   History reviewed. No pertinent surgical history. Family History:  Family History  Problem Relation Age of Onset  . CAD Mother   . CAD Sister    Family Psychiatric  History: none Social History:  Social History   Substance and Sexual Activity  Alcohol Use No   Comment: occassionally     Social History    Substance and Sexual Activity  Drug Use Not Currently  . Types: Marijuana   Comment: Last use 11/29/16    Social History   Socioeconomic History  . Marital status: Single    Spouse name: Not on file  . Number of children: Not on file  . Years of education: Not on file  . Highest education level: Not on file  Occupational History  . Not on file  Tobacco Use  . Smoking status: Current Every Day Smoker    Packs/day: 1.00    Years: 23.00    Pack years: 23.00    Types: Cigarettes  . Smokeless tobacco: Never Used  . Tobacco comment: will provide material  Substance and Sexual Activity  . Alcohol use: No    Comment: occassionally  . Drug use: Not Currently    Types: Marijuana    Comment: Last use 11/29/16  . Sexual activity: Never    Birth control/protection: None    Comment: occasional marijuana- none recently  Other Topics Concern  . Not on file  Social History Narrative   From a group home in [place]   Social Determinants of Health   Financial Resource Strain:   . Difficulty of Paying Living Expenses:   Food Insecurity:   . Worried About Programme researcher, broadcasting/film/video in the Last Year:   . Barista in the Last Year:   Transportation Needs:   . Freight forwarder (Medical):   Marland Kitchen Lack of Transportation (Non-Medical):   Physical Activity:   . Days of Exercise per Week:   . Minutes of  Exercise per Session:   Stress:   . Feeling of Stress :   Social Connections:   . Frequency of Communication with Friends and Family:   . Frequency of Social Gatherings with Friends and Family:   . Attends Religious Services:   . Active Member of Clubs or Organizations:   . Attends Banker Meetings:   Marland Kitchen Marital Status:     Has this patient used any form of tobacco in the last 30 days? (Cigarettes, Smokeless Tobacco, Cigars, and/or Pipes) A prescription for an FDA-approved tobacco cessation medication was offered at discharge and the patient refused  Current  Medications: Current Facility-Administered Medications  Medication Dose Route Frequency Provider Last Rate Last Admin  . albuterol (PROVENTIL) (2.5 MG/3ML) 0.083% nebulizer solution 3 mL  3 mL Inhalation Q6H PRN Gillermo Murdoch, NP      . apixaban (ELIQUIS) tablet 5 mg  5 mg Oral BID Gillermo Murdoch, NP   5 mg at 01/05/20 1055  . cloZAPine (CLOZARIL) tablet 500 mg  500 mg Oral QHS Gillermo Murdoch, NP      . fluticasone furoate-vilanterol (BREO ELLIPTA) 200-25 MCG/INH 1 puff  1 puff Inhalation Daily Gillermo Murdoch, NP      . insulin aspart (novoLOG) injection 0-5 Units  0-5 Units Subcutaneous QHS Gillermo Murdoch, NP      . insulin detemir (LEVEMIR) injection 10 Units  10 Units Subcutaneous BID Gillermo Murdoch, NP      . lisinopril (ZESTRIL) tablet 2.5 mg  2.5 mg Oral Daily Gillermo Murdoch, NP   2.5 mg at 01/05/20 1054  . lithium carbonate (ESKALITH) CR tablet 450 mg  450 mg Oral Q12H Gillermo Murdoch, NP      . metFORMIN (GLUCOPHAGE) tablet 1,000 mg  1,000 mg Oral BID WC Gillermo Murdoch, NP   1,000 mg at 01/05/20 0900  . traZODone (DESYREL) tablet 100 mg  100 mg Oral QHS Gillermo Murdoch, NP       Current Outpatient Medications  Medication Sig Dispense Refill  . albuterol (VENTOLIN HFA) 108 (90 Base) MCG/ACT inhaler Inhale 1-2 puffs into the lungs every 6 (six) hours as needed for wheezing or shortness of breath. 18 g 1  . apixaban (ELIQUIS) 5 MG TABS tablet Take 1 tablet (5 mg total) by mouth 2 (two) times daily. 60 tablet 1  . cloZAPine (CLOZARIL) 100 MG tablet Take 5 tablets (500 mg total) by mouth at bedtime. 150 tablet 1  . fluticasone furoate-vilanterol (BREO ELLIPTA) 200-25 MCG/INH AEPB Inhale 1 puff into the lungs daily. 30 each 6  . insulin detemir (LEVEMIR) 100 UNIT/ML injection Inject 0.1 mLs (10 Units total) into the skin 2 (two) times daily. 10 mL 11  . lisinopril (ZESTRIL) 2.5 MG tablet Take 1 tablet (2.5 mg total) by mouth daily. 30  tablet 1  . lithium carbonate (ESKALITH) 450 MG CR tablet Take 1 tablet (450 mg total) by mouth every 12 (twelve) hours. 60 tablet 1  . metFORMIN (GLUCOPHAGE) 1000 MG tablet Take 1 tablet (1,000 mg total) by mouth 2 (two) times daily with a meal. 60 tablet 1  . sertraline (ZOLOFT) 50 MG tablet Take 1 tablet (50 mg total) by mouth daily. 30 tablet 1  . simvastatin (ZOCOR) 40 MG tablet Take 1 tablet (40 mg total) by mouth daily at 6 PM. 30 tablet 6  . traZODone (DESYREL) 100 MG tablet Take 1 tablet (100 mg total) by mouth at bedtime. 30 tablet 1   PTA Medications: (Not in a hospital admission)  Musculoskeletal: Strength & Muscle Tone: within normal limits Gait & Station: normal Patient leans: N/A  Psychiatric Specialty Exam: Physical Exam Vitals and nursing note reviewed.  Constitutional:      Appearance: Normal appearance.  HENT:     Head: Normocephalic.     Nose: Nose normal.  Pulmonary:     Effort: Pulmonary effort is normal.  Musculoskeletal:        General: Normal range of motion.     Cervical back: Normal range of motion.  Neurological:     General: No focal deficit present.     Mental Status: He is alert and oriented to person, place, and time.  Psychiatric:        Attention and Perception: Attention and perception normal.        Mood and Affect: Mood is anxious. Affect is blunt.        Speech: Speech normal.        Behavior: Behavior normal. Behavior is cooperative.        Thought Content: Thought content normal.        Cognition and Memory: Cognition and memory normal.        Judgment: Judgment normal.     Review of Systems  Psychiatric/Behavioral: The patient is nervous/anxious.   All other systems reviewed and are negative.   Blood pressure 112/77, pulse 92, temperature 98.1 F (36.7 C), temperature source Oral, resp. rate 18, height 5' 10.5" (1.791 m), weight 90.7 kg, SpO2 97 %.Body mass index is 28.29 kg/m.  General Appearance: Casual  Eye Contact:  Good   Speech:  Normal Rate  Volume:  Normal  Mood:  Anxious and Irritable  Affect:  Blunt  Thought Process:  Coherent and Descriptions of Associations: Intact  Orientation:  Full (Time, Place, and Person)  Thought Content:  WDL and Logical  Suicidal Thoughts:  No  Homicidal Thoughts:  No  Memory:  Immediate;   Good Recent;   Good Remote;   Good  Judgement:  Fair  Insight:  Fair  Psychomotor Activity:  Normal  Concentration:  Concentration: Good and Attention Span: Good  Recall:  Good  Fund of Knowledge:  Fair  Language:  Good  Akathisia:  No  Handed:  Right  AIMS (if indicated):     Assets:  Housing Leisure Time Physical Health Resilience Social Support  ADL's:  Intact  Cognition:  WNL  Sleep:        Demographic Factors:  Male and Caucasian  Loss Factors: NA  Historical Factors: NA  Risk Reduction Factors:   Sense of responsibility to family, Living with another person, especially a relative, Positive social support and Positive therapeutic relationship  Continued Clinical Symptoms:  Anxiety, mild; irritable at times  Cognitive Features That Contribute To Risk:  None    Suicide Risk:  Minimal: No identifiable suicidal ideation.  Patients presenting with no risk factors but with morbid ruminations; may be classified as minimal risk based on the severity of the depressive symptoms   Plan Of Care/Follow-up recommendations:  Schizoaffective disorder, bipolar type: -Continue Clazuril 500 mg at bedtime -Continue lithium 450 mg twice daily  Depression: -Continue Zoloft 50 mg daily  Insomnia: -Continue trazodone 100 mg daily at bedtime Activity:  as tolerated Diet:  heart healthy diet  Disposition: discharge to group home Nanine Means, NP 01/05/2020, 11:09 AM

## 2020-01-05 NOTE — Discharge Instructions (Signed)
Living With Depression Everyone experiences occasional disappointment, sadness, and loss in their lives. When you are feeling down, blue, or sad for at least 2 weeks in a row, it may mean that you have depression. Depression can affect your thoughts and feelings, relationships, daily activities, and physical health. It is caused by changes in the way your brain functions. If you receive a diagnosis of depression, your health care provider will tell you which type of depression you have and what treatment options are available to you. If you are living with depression, there are ways to help you recover from it and also ways to prevent it from coming back. How to cope with lifestyle changes Coping with stress     Stress is your body's reaction to life changes and events, both good and bad. Stressful situations may include:  Getting married.  The death of a spouse.  Losing a job.  Retiring.  Having a baby. Stress can last just a few hours or it can be ongoing. Stress can play a major role in depression, so it is important to learn both how to cope with stress and how to think about it differently. Talk with your health care provider or a counselor if you would like to learn more about stress reduction. He or she may suggest some stress reduction techniques, such as:  Music therapy. This can include creating music or listening to music. Choose music that you enjoy and that inspires you.  Mindfulness-based meditation. This kind of meditation can be done while sitting or walking. It involves being aware of your normal breaths, rather than trying to control your breathing.  Centering prayer. This is a kind of meditation that involves focusing on a spiritual word or phrase. Choose a word, phrase, or sacred image that is meaningful to you and that brings you peace.  Deep breathing. To do this, expand your stomach and inhale slowly through your nose. Hold your breath for 3-5 seconds, then exhale  slowly, allowing your stomach muscles to relax.  Muscle relaxation. This involves intentionally tensing muscles then relaxing them. Choose a stress reduction technique that fits your lifestyle and personality. Stress reduction techniques take time and practice to develop. Set aside 5-15 minutes a day to do them. Therapists can offer training in these techniques. The training may be covered by some insurance plans. Other things you can do to manage stress include:  Keeping a stress diary. This can help you learn what triggers your stress and ways to control your response.  Understanding what your limits are and saying no to requests or events that lead to a schedule that is too full.  Thinking about how you respond to certain situations. You may not be able to control everything, but you can control how you react.  Adding humor to your life by watching funny films or TV shows.  Making time for activities that help you relax and not feeling guilty about spending your time this way.  Medicines Your health care provider may suggest certain medicines if he or she feels that they will help improve your condition. Avoid using alcohol and other substances that may prevent your medicines from working properly (may interact). It is also important to:  Talk with your pharmacist or health care provider about all the medicines that you take, their possible side effects, and what medicines are safe to take together.  Make it your goal to take part in all treatment decisions (shared decision-making). This includes giving input on   the side effects of medicines. It is best if shared decision-making with your health care provider is part of your total treatment plan. If your health care provider prescribes a medicine, you may not notice the full benefits of it for 4-8 weeks. Most people who are treated for depression need to be on medicine for at least 6-12 months after they feel better. If you are taking  medicines as part of your treatment, do not stop taking medicines without first talking to your health care provider. You may need to have the medicine slowly decreased (tapered) over time to decrease the risk of harmful side effects. Relationships Your health care provider may suggest family therapy along with individual therapy and drug therapy. While there may not be family problems that are causing you to feel depressed, it is still important to make sure your family learns as much as they can about your mental health. Having your family's support can help make your treatment successful. How to recognize changes in your condition Everyone has a different response to treatment for depression. Recovery from major depression happens when you have not had signs of major depression for two months. This may mean that you will start to:  Have more interest in doing activities.  Feel less hopeless than you did 2 months ago.  Have more energy.  Overeat less often, or have better or improving appetite.  Have better concentration. Your health care provider will work with you to decide the next steps in your recovery. It is also important to recognize when your condition is getting worse. Watch for these signs:  Having fatigue or low energy.  Eating too much or too little.  Sleeping too much or too little.  Feeling restless, agitated, or hopeless.  Having trouble concentrating or making decisions.  Having unexplained physical complaints.  Feeling irritable, angry, or aggressive. Get help as soon as you or your family members notice these symptoms coming back. How to get support and help from others How to talk with friends and family members about your condition  Talking to friends and family members about your condition can provide you with one way to get support and guidance. Reach out to trusted friends or family members, explain your symptoms to them, and let them know that you are  working with a health care provider to treat your depression. Financial resources Not all insurance plans cover mental health care, so it is important to check with your insurance carrier. If paying for co-pays or counseling services is a problem, search for a local or county mental health care center. They may be able to offer public mental health care services at low or no cost when you are not able to see a private health care provider. If you are taking medicine for depression, you may be able to get the generic form, which may be less expensive. Some makers of prescription medicines also offer help to patients who cannot afford the medicines they need. Follow these instructions at home:   Get the right amount and quality of sleep.  Cut down on using caffeine, tobacco, alcohol, and other potentially harmful substances.  Try to exercise, such as walking or lifting small weights.  Take over-the-counter and prescription medicines only as told by your health care provider.  Eat a healthy diet that includes plenty of vegetables, fruits, whole grains, low-fat dairy products, and lean protein. Do not eat a lot of foods that are high in solid fats, added sugars, or salt.    Keep all follow-up visits as told by your health care provider. This is important. Contact a health care provider if:  You stop taking your antidepressant medicines, and you have any of these symptoms: ? Nausea. ? Headache. ? Feeling lightheaded. ? Chills and body aches. ? Not being able to sleep (insomnia).  You or your friends and family think your depression is getting worse. Get help right away if:  You have thoughts of hurting yourself or others. If you ever feel like you may hurt yourself or others, or have thoughts about taking your own life, get help right away. You can go to your nearest emergency department or call:  Your local emergency services (911 in the [place]  A suicide crisis helpline, such as the  National Suicide Prevention Lifeline at 1-800-273-8255. This is open 24-hours a day. Summary  If you are living with depression, there are ways to help you recover from it and also ways to prevent it from coming back.  Work with your health care team to create a management plan that includes counseling, stress management techniques, and healthy lifestyle habits. This information is not intended to replace advice given to you by your health care provider. Make sure you discuss any questions you have with your health care provider. Document Revised: 09/23/2018 Document Reviewed: 05/04/2016 Elsevier Patient Education  2020 [employer]  

## 2020-01-05 NOTE — BH Assessment (Addendum)
Spoke with pt's group home [phone] concerning pt's discharge. Group home agreed to pick the pt up today at 13:00. ED staff and legal guardian [name] [phone]) have been notified.

## 2020-01-05 NOTE — ED Notes (Signed)
Patient refused his bedtime medications. No issues.

## 2020-01-05 NOTE — BH Assessment (Signed)
Writer spoke with the patient to complete an updated/reassessment. Patient denies SI/HI and AV/H. Pt was calm and cooperative. Pt's thought content was congruent to the situation. Per psych NP, patient does not meet inpatient criteria and can be discharged today.

## 2020-01-05 NOTE — Consult Note (Signed)
[facility] Face-to-Face Psychiatry Consult   Reason for Consult: Behavior Problem Referring Physician: Dr. Larinda Buttery Patient Identification: Lance [name] MRN:  [id number] Principal Diagnosis: <principal problem not specified> Diagnosis:  Active Problems:   Diabetes mellitus without complication (HCC)   Chronic anticoagulation   Schizoaffective disorder (HCC)   Schizophrenia, undifferentiated (HCC)   Schizophrenia (HCC)   Alcohol abuse   Suicidal ideations   MDD (major depressive disorder)   Total Time spent with patient: 30 minutes  Subjective: " I don't want to live at that group home anymore. I don't trust the staff."  Lance [name] is a 44 y.o. male patient presented to [facility] ED via law enforcement voluntarily. Per the ED triage nurse note, the patient is here to the er for SI and HI. The patient states that the people at his group home aren't making anything easier. The patient states he walked to the police dept, and they dropped him here. The patient says he has a hard time coping with his problems. During the patient initial psychiatric assessment, the patient states that he has been having a difficult time at his group home, where he does not get along with the staff and other residents. He voiced due to them harassing him; he has become paranoid.   He sometimes states he has "bad thoughts," including some thoughts of harming himself, but he denies any specific plan. The patient denies any SI, HI, and AVH during his assessment.  The patient was seen face-to-face by this provider; the chart was reviewed and consulted with Dr. Larinda Buttery on 01/04/2020 due to the patient's care. It was discussed with the EDP that the patient remained under observation overnight and will be reassessed in the a.m. to determine if he meets the criteria for psychiatric inpatient admission; he could be discharged back to his group home.   The patient is alert and oriented x 4, calm, cooperative, and mood-congruent  with affect on evaluation. The patient does not appear to be responding to internal or external stimuli. Neither is the patient presenting with any delusional thinking. The patient denies auditory or visual hallucinations. The patient denies any suicidal, homicidal, or self-harm ideations. The patient is not presenting with any psychotic or paranoid behaviors. During an encounter with the patient, he was able to answer questions appropriately.    Plan: The patient remained under observation overnight and will be reassessed in the a.m. to determine if he meets the criteria for psychiatric inpatient admission; he could be discharged back to his group home  HPI: Per Dr. Larinda Buttery: Lance [name] is a 44 y.o. male with past medical history of schizoaffective disorder, hypertension, hyperlipidemia, diabetes, PE and lupus anticoagulant on Xarelto who presents to the ED for psychiatric evaluation.  Patient states that he has been having a difficult time at his group home, where he does not get along with the staff and other residents.  He states at times he is having "bad thoughts", including some thoughts of harming himself, but he denies any specific plan.  He denies any thoughts of harming anyone else.  He currently denies any medical complaints, has not had any issues with alcohol or drug abuse.  Past Psychiatric History:  Depression Schizo affective schizophrenia (HCC)  Risk to Self:   No Risk to Others:   No Prior Inpatient Therapy:   Yes Prior Outpatient Therapy:   Yes  Past Medical History:  Past Medical History:  Diagnosis Date   Depression    Diabetes mellitus without  complication (HCC)    Hyperlipidemia    Hypertension    Lupus anticoagulant disorder (HCC) 06/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   PE (pulmonary thromboembolism) (HCC)    Schizo affective schizophrenia (HCC)    Supratherapeutic INR 11/19/2016   History reviewed. No pertinent surgical history. Family  History:  Family History  Problem Relation Age of Onset   CAD Mother    CAD Sister    Family Psychiatric  History:  Social History:  Social History   Substance and Sexual Activity  Alcohol Use No   Comment: occassionally     Social History   Substance and Sexual Activity  Drug Use Not Currently   Types: Marijuana   Comment: Last use 11/29/16    Social History   Socioeconomic History   Marital status: Single    Spouse name: Not on file   Number of children: Not on file   Years of education: Not on file   Highest education level: Not on file  Occupational History   Not on file  Tobacco Use   Smoking status: Current Every Day Smoker    Packs/day: 1.00    Years: 23.00    Pack years: 23.00    Types: Cigarettes   Smokeless tobacco: Never Used   Tobacco comment: will provide material  Substance and Sexual Activity   Alcohol use: No    Comment: occassionally   Drug use: Not Currently    Types: Marijuana    Comment: Last use 11/29/16   Sexual activity: Never    Birth control/protection: None    Comment: occasional marijuana- none recently  Other Topics Concern   Not on file  Social History Narrative   From a group home in [place]   Social Determinants of Health   Financial Resource Strain:    Difficulty of Paying Living Expenses:   Food Insecurity:    Worried About Programme researcher, broadcasting/film/video in the Last Year:    Barista in the Last Year:   Transportation Needs:    Freight forwarder (Medical):    Lack of Transportation (Non-Medical):   Physical Activity:    Days of Exercise per Week:    Minutes of Exercise per Session:   Stress:    Feeling of Stress :   Social Connections:    Frequency of Communication with Friends and Family:    Frequency of Social Gatherings with Friends and Family:    Attends Religious Services:    Active Member of Clubs or Organizations:    Attends Banker Meetings:    Marital  Status:    Additional Social History:    Allergies:   Allergies  Allergen Reactions   Penicillins Other (See Comments)    Reaction: "lockjaw" Has patient had a PCN reaction causing immediate rash, facial/tongue/throat swelling, SOB or lightheadedness with hypotension: No Has patient had a PCN reaction causing severe rash involving mucus membranes or skin necrosis: No Has patient had a PCN reaction that required hospitalization: No Has patient had a PCN reaction occurring within the last 10 years: No If all of the above answers are "NO", then may proceed with Cephalosporin use.     Labs:  Results for orders placed or performed during the hospital encounter of 01/04/20 (from the past 48 hour(s))  Comprehensive metabolic panel     Status: Abnormal   Collection Time: 01/04/20  9:13 PM  Result Value Ref Range   Sodium 142 135 - 145  mmol/L   Potassium 3.8 3.5 - 5.1 mmol/L   Chloride 111 98 - 111 mmol/L   CO2 22 22 - 32 mmol/L   Glucose, Bld 121 (H) 70 - 99 mg/dL    Comment: Glucose reference range applies only to samples taken after fasting for at least 8 hours.   BUN 19 6 - 20 mg/dL   Creatinine, Ser 7.84 0.61 - 1.24 mg/dL   Calcium 69.6 8.9 - 29.5 mg/dL   Total Protein 7.2 6.5 - 8.1 g/dL   Albumin 5.0 3.5 - 5.0 g/dL   AST 18 15 - 41 U/L   ALT 16 0 - 44 U/L   Alkaline Phosphatase 51 38 - 126 U/L   Total Bilirubin 0.7 0.3 - 1.2 mg/dL   GFR calc non Af Amer >60 >60 mL/min   GFR calc Af Amer >60 >60 mL/min   Anion gap 9 5 - 15    Comment: Performed at [facility], [address] [place], [place] [address]  Ethanol     Status: None   Collection Time: 01/04/20  9:13 PM  Result Value Ref Range   Alcohol, Ethyl (B) <10 <10 mg/dL    Comment: (NOTE) Lowest detectable limit for serum alcohol is 10 mg/dL.  For medical purposes only. Performed at [facility], [address] [place], [place] [address]   Salicylate level     Status: Abnormal   Collection  Time: 01/04/20  9:13 PM  Result Value Ref Range   Salicylate Lvl <7.0 (L) 7.0 - 30.0 mg/dL    Comment: Performed at [facility], [address] [place], [place] [address]  Acetaminophen level     Status: Abnormal   Collection Time: 01/04/20  9:13 PM  Result Value Ref Range   Acetaminophen (Tylenol), Serum <10 (L) 10 - 30 ug/mL    Comment: (NOTE) Therapeutic concentrations vary significantly. A range of 10-30 ug/mL  may be an effective concentration for many patients. However, some  are best treated at concentrations outside of this range. Acetaminophen concentrations >150 ug/mL at 4 hours after ingestion  and >50 ug/mL at 12 hours after ingestion are often associated with  toxic reactions.  Performed at [facility], [address] [place], [place] [address]   cbc     Status: Abnormal   Collection Time: 01/04/20  9:13 PM  Result Value Ref Range   WBC 4.3 4.0 - 10.5 K/uL   RBC 4.74 4.22 - 5.81 MIL/uL   Hemoglobin 15.6 13.0 - 17.0 g/dL   HCT 03.4 39 - 52 %   MCV 90.9 80.0 - 100.0 fL   MCH 32.9 26.0 - 34.0 pg   MCHC 36.2 (H) 30.0 - 36.0 g/dL   RDW 74.2 59.5 - 63.8 %   Platelets 187 150 - 400 K/uL   nRBC 0.0 0.0 - 0.2 %    Comment: Performed at [facility], [address] [place], [place] [address]  Urine Drug Screen, Qualitative     Status: None   Collection Time: 01/04/20  9:13 PM  Result Value Ref Range   Tricyclic, Ur Screen NONE DETECTED NONE DETECTED   Amphetamines, Ur Screen NONE DETECTED NONE DETECTED   MDMA (Ecstasy)Ur Screen NONE DETECTED NONE DETECTED   Cocaine Metabolite,Ur [place] NONE DETECTED NONE DETECTED   Opiate, Ur Screen NONE DETECTED NONE DETECTED   Phencyclidine (PCP) Ur S NONE DETECTED NONE DETECTED   Cannabinoid 50 Ng, Ur [place] NONE DETECTED NONE DETECTED  Barbiturates, Ur Screen NONE DETECTED NONE DETECTED   Benzodiazepine, Ur Scrn NONE DETECTED NONE DETECTED   Methadone Scn, Ur NONE DETECTED NONE DETECTED    Comment:  (NOTE) Tricyclics + metabolites, urine    Cutoff 1000 ng/mL Amphetamines + metabolites, urine  Cutoff 1000 ng/mL MDMA (Ecstasy), urine              Cutoff 500 ng/mL Cocaine Metabolite, urine          Cutoff 300 ng/mL Opiate + metabolites, urine        Cutoff 300 ng/mL Phencyclidine (PCP), urine         Cutoff 25 ng/mL Cannabinoid, urine                 Cutoff 50 ng/mL Barbiturates + metabolites, urine  Cutoff 200 ng/mL Benzodiazepine, urine              Cutoff 200 ng/mL Methadone, urine                   Cutoff 300 ng/mL  The urine drug screen provides only a preliminary, unconfirmed analytical test result and should not be used for non-medical purposes. Clinical consideration and professional judgment should be applied to any positive drug screen result due to possible interfering substances. A more specific alternate chemical method must be used in order to obtain a confirmed analytical result. Gas chromatography / mass spectrometry (GC/MS) is the preferred confirm atory method. Performed at [facility], [address] [place], [place] [address]   CBC with Differential/Platelet     Status: None   Collection Time: 01/04/20  9:22 PM  Result Value Ref Range   WBC 4.2 4.0 - 10.5 K/uL   RBC 4.83 4.22 - 5.81 MIL/uL   Hemoglobin 15.4 13.0 - 17.0 g/dL   HCT 78.245.1 39 - 52 %   MCV 93.4 80.0 - 100.0 fL   MCH 31.9 26.0 - 34.0 pg   MCHC 34.1 30.0 - 36.0 g/dL   RDW 95.612.3 21.311.5 - 08.615.5 %   Platelets 201 150 - 400 K/uL   nRBC 0.0 0.0 - 0.2 %   Neutrophils Relative % 49 %   Neutro Abs 2.0 1.7 - 7.7 K/uL   Lymphocytes Relative 37 %   Lymphs Abs 1.5 0.7 - 4.0 K/uL   Monocytes Relative 14 %   Monocytes Absolute 0.6 0 - 1 K/uL   Eosinophils Relative 0 %   Eosinophils Absolute 0.0 0 - 0 K/uL   Basophils Relative 0 %   Basophils Absolute 0.0 0 - 0 K/uL   Immature Granulocytes 0 %   Abs Immature Granulocytes 0.01 0.00 - 0.07 K/uL    Comment: Performed at [facility], [address] [place], [place] [address]  SARS Coronavirus 2 by RT PCR (hospital order, performed in [facility] hospital lab) Nasopharyngeal Nasopharyngeal Swab     Status: None   Collection Time: 01/04/20 10:07 PM   Specimen: Nasopharyngeal Swab  Result Value Ref Range   SARS Coronavirus 2 NEGATIVE NEGATIVE    Comment: (NOTE) SARS-CoV-2 target nucleic acids are NOT DETECTED.  The SARS-CoV-2 RNA is generally detectable in upper and lower respiratory specimens during the acute phase of infection. The lowest concentration of SARS-CoV-2 viral copies this assay can detect is 250 copies / mL. A negative result does not preclude SARS-CoV-2 infection and should not be used as the sole basis for treatment or other patient management decisions.  A negative result may occur  with improper specimen collection / handling, submission of specimen other than nasopharyngeal swab, presence of viral mutation(s) within the areas targeted by this assay, and inadequate number of viral copies (<250 copies / mL). A negative result must be combined with clinical observations, patient history, and epidemiological information.  Fact Sheet for Patients:   BoilerBrush.com.cy  Fact Sheet for Healthcare Providers: https://pope.com/  This test is not yet approved or  cleared by the [place] FDA and has been authorized for detection and/or diagnosis of SARS-CoV-2 by FDA under an Emergency Use Authorization (EUA).  This EUA will remain in effect (meaning this test can be used) for the duration of the COVID-19 declaration under Section 564(b)(1) of the Act, 21 U.S.C. section 360bbb-3(b)(1), unless the authorization is terminated or revoked sooner.  Performed at [facility], [address], [place], [place] [address]     Current Facility-Administered Medications  Medication Dose Route Frequency Provider Last Rate Last Admin   albuterol (PROVENTIL)  (2.5 MG/3ML) 0.083% nebulizer solution 3 mL  3 mL Inhalation Q6H PRN Gillermo Murdoch, NP       apixaban Everlene Balls) tablet 5 mg  5 mg Oral BID Gillermo Murdoch, NP       cloZAPine (CLOZARIL) tablet 500 mg  500 mg Oral QHS Gillermo Murdoch, NP       fluticasone furoate-vilanterol (BREO ELLIPTA) 200-25 MCG/INH 1 puff  1 puff Inhalation Daily Gillermo Murdoch, NP       insulin aspart (novoLOG) injection 0-5 Units  0-5 Units Subcutaneous QHS Gillermo Murdoch, NP       insulin detemir (LEVEMIR) injection 10 Units  10 Units Subcutaneous BID Gillermo Murdoch, NP       lisinopril (ZESTRIL) tablet 2.5 mg  2.5 mg Oral Daily Gillermo Murdoch, NP       lithium carbonate (ESKALITH) CR tablet 450 mg  450 mg Oral Q12H Gillermo Murdoch, NP       metFORMIN (GLUCOPHAGE) tablet 1,000 mg  1,000 mg Oral BID WC Gillermo Murdoch, NP       traZODone (DESYREL) tablet 100 mg  100 mg Oral QHS Gillermo Murdoch, NP       Current Outpatient Medications  Medication Sig Dispense Refill   albuterol (VENTOLIN HFA) 108 (90 Base) MCG/ACT inhaler Inhale 1-2 puffs into the lungs every 6 (six) hours as needed for wheezing or shortness of breath. 18 g 1   apixaban (ELIQUIS) 5 MG TABS tablet Take 1 tablet (5 mg total) by mouth 2 (two) times daily. 60 tablet 1   cloZAPine (CLOZARIL) 100 MG tablet Take 5 tablets (500 mg total) by mouth at bedtime. 150 tablet 1   fluticasone furoate-vilanterol (BREO ELLIPTA) 200-25 MCG/INH AEPB Inhale 1 puff into the lungs daily. 30 each 6   insulin detemir (LEVEMIR) 100 UNIT/ML injection Inject 0.1 mLs (10 Units total) into the skin 2 (two) times daily. 10 mL 11   lisinopril (ZESTRIL) 2.5 MG tablet Take 1 tablet (2.5 mg total) by mouth daily. 30 tablet 1   lithium carbonate (ESKALITH) 450 MG CR tablet Take 1 tablet (450 mg total) by mouth every 12 (twelve) hours. 60 tablet 1   metFORMIN (GLUCOPHAGE) 1000 MG tablet Take 1 tablet (1,000 mg total) by  mouth 2 (two) times daily with a meal. 60 tablet 1   sertraline (ZOLOFT) 50 MG tablet Take 1 tablet (50 mg total) by mouth daily. 30 tablet 1   simvastatin (ZOCOR) 40 MG tablet Take 1 tablet (40 mg total) by mouth daily at  6 PM. 30 tablet 6   traZODone (DESYREL) 100 MG tablet Take 1 tablet (100 mg total) by mouth at bedtime. 30 tablet 1    Musculoskeletal: Strength & Muscle Tone: within normal limits Gait & Station: normal Patient leans: N/A  Psychiatric Specialty Exam: Physical Exam Psychiatric:        Attention and Perception: Attention and perception normal.        Mood and Affect: Mood is anxious and depressed. Affect is blunt and flat.        Speech: Speech normal.        Behavior: Behavior normal. Behavior is cooperative.        Thought Content: Thought content is paranoid.        Cognition and Memory: Cognition normal.        Judgment: Judgment is impulsive.     Review of Systems  Psychiatric/Behavioral: Positive for behavioral problems. The patient is nervous/anxious.   All other systems reviewed and are negative.   Blood pressure 112/77, pulse 92, temperature 98.1 F (36.7 C), temperature source Oral, resp. rate 18, height 5' 10.5" (1.791 m), weight 90.7 kg, SpO2 97 %.Body mass index is 28.29 kg/m.  General Appearance: Bizarre  Eye Contact:  Poor  Speech:  Clear and Coherent  Volume:  Normal  Mood:  Anxious and Irritable  Affect:  Congruent and Inappropriate  Thought Process:  Coherent  Orientation:  Full (Time, Place, and Person)  Thought Content:  Illogical, Obsessions, Paranoid Ideation and Rumination  Suicidal Thoughts:  No  Homicidal Thoughts:  No  Memory:  Immediate;   Fair Recent;   Fair Remote;   Fair  Judgement:  Poor  Insight:  Lacking  Psychomotor Activity:  Increased  Concentration:  Concentration: Fair and Attention Span: Fair  Recall:  [employer] of Knowledge:  Fair  Language:  Fair  Akathisia:  Negative  Handed:  Right  AIMS (if  indicated):     Assets:  Communication Skills Desire for Improvement Resilience Social Support  ADL's:  Intact  Cognition:  WNL  Sleep:    Good when I take my medications     Treatment Plan Summary: Daily contact with patient to assess and evaluate symptoms and progress in treatment, Medication management and The patient remained under observation overnight and will be reassessed in the a.m. to determine if he meets the criteria for psychiatric inpatient admission; he could be discharged back home..  Disposition: No evidence of imminent risk to self or others at present.   Supportive therapy provided about ongoing stressors. The patient remained under observation overnight and will be reassessed in the a.m. to determine if he meets the criteria for psychiatric inpatient admission; he could be discharged back home.  Gillermo Murdoch, NP 01/05/2020 1:03 AM

## 2020-01-05 NOTE — ED Notes (Addendum)
Patient discharged back to his group home. Patient received belongings and verbalized he has received all of his belongings. Patient appropriate and cooperative, Denies SI/HI AVH. Vital signs taken. NAD noted. Patient legal guardian [name] notified

## 2020-01-05 NOTE — BH Assessment (Signed)
Assessment Note  Lance [name] is an 44 y.o. male presenting to [facility] ED voluntarily due to issues at his group home. Per triage note Pt to the er for SI and HI. Pt states that the people at his group home aren't making anything easier. Pt states he walked to the police dept and they dropped him here. Pt says he has a hard time coping with his problems. During assessment patient is alert and oriented x4, calm and cooperative. Patient reported "I started feeling on edge around those people." Patient reports not being satisfying with his current group home and reports "I've been there a year and a half." Patient is currently denying SI/HI/AH/VH and is not currently responding to any internal or external stimuli.  Per Psyc NP patient to be observed overnight and reassessed in the morning.  Diagnosis: Depression  Past Medical History:  Past Medical History:  Diagnosis Date  . Depression   . Diabetes mellitus without complication (HCC)   . Hyperlipidemia   . Hypertension   . Lupus anticoagulant disorder (HCC) 06/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  . PE (pulmonary thromboembolism) (HCC)   . Schizo affective schizophrenia (HCC)   . Supratherapeutic INR 11/19/2016    History reviewed. No pertinent surgical history.  Family History:  Family History  Problem Relation Age of Onset  . CAD Mother   . CAD Sister     Social History:  reports that he has been smoking cigarettes. He has a 23.00 pack-year smoking history. He has never used smokeless tobacco. He reports previous drug use. Drug: Marijuana. He reports that he does not drink alcohol.  Additional Social History:  Alcohol / Drug Use Pain Medications: See MAR Prescriptions: See MAR Over the Counter: See MAR History of alcohol / drug use?: No history of alcohol / drug abuse  CIWA: CIWA-Ar BP: 112/77 Pulse Rate: 92 COWS:    Allergies:  Allergies  Allergen Reactions  . Penicillins Other (See Comments)    Reaction:  "lockjaw" Has patient had a PCN reaction causing immediate rash, facial/tongue/throat swelling, SOB or lightheadedness with hypotension: No Has patient had a PCN reaction causing severe rash involving mucus membranes or skin necrosis: No Has patient had a PCN reaction that required hospitalization: No Has patient had a PCN reaction occurring within the last 10 years: No If all of the above answers are "NO", then may proceed with Cephalosporin use.     Home Medications: (Not in a hospital admission)   OB/GYN Status:  No LMP for male patient.  General Assessment Data Location of Assessment: [facility] ED TTS Assessment: In system Is this a Tele or Face-to-Face Assessment?: Face-to-Face Is this an Initial Assessment or a Re-assessment for this encounter?: Initial Assessment Patient Accompanied by:: N/A Language Other than English: No Living Arrangements: In Group Home: (Comment: Name of Group Home) What gender do you identify as?: Male Marital status: Single Pregnancy Status: No Living Arrangements: Group Home Can pt return to current living arrangement?: Yes Admission Status: Voluntary Is patient capable of signing voluntary admission?: No (Patient has a legal guardian) Referral Source: Self/Family/Friend Insurance type: Medicare  Medical Screening Exam [facility] Walk-in ONLY) Medical Exam completed: Yes  Crisis Care Plan Living Arrangements: Group Home Legal Guardian: Other: [name]) Name of Psychiatrist: Unknown Name of Therapist: None  Education Status Is patient currently in school?: No Is the patient employed, unemployed or receiving disability?: Receiving disability income  Risk to self with the past 6 months Suicidal  Ideation: No-Not Currently/Within Last 6 Months Has patient been a risk to self within the past 6 months prior to admission? : No Suicidal Intent: No-Not Currently/Within Last 6 Months Has patient had any suicidal intent within the past 6 months prior to  admission? : No Is patient at risk for suicide?: No Suicidal Plan?: No Has patient had any suicidal plan within the past 6 months prior to admission? : No Specify Current Suicidal Plan: None reported Access to Means: No What has been your use of drugs/alcohol within the last 12 months?: None Previous Attempts/Gestures: Yes How many times?: 1 Other Self Harm Risks: None Triggers for Past Attempts: Other (Comment) (Group Home issues) Intentional Self Injurious Behavior: None Family Suicide History: Unknown Recent stressful life event(s): Other (Comment) (Group Home conflict) Persecutory voices/beliefs?: No Depression: Yes Depression Symptoms: Feeling worthless/self pity, Feeling angry/irritable, Loss of interest in usual pleasures, Isolating Substance abuse history and/or treatment for substance abuse?: No Suicide prevention information given to non-admitted patients: Not applicable  Risk to Others within the past 6 months Homicidal Ideation: No Does patient have any lifetime risk of violence toward others beyond the six months prior to admission? : No Thoughts of Harm to Others: No Comment - Thoughts of Harm to Others: None Current Homicidal Intent: No Current Homicidal Plan: No Describe Current Homicidal Plan: None Access to Homicidal Means: No Describe Access to Homicidal Means: None History of harm to others?: No Assessment of Violence: None Noted Violent Behavior Description: None Does patient have access to weapons?: No Criminal Charges Pending?: No Does patient have a court date: No Is patient on probation?: No  Psychosis Hallucinations: None noted Delusions: None noted  Mental Status Report Appearance/Hygiene: In scrubs Eye Contact: Fair Motor Activity: Freedom of movement Speech: Logical/coherent Level of Consciousness: Alert Mood: Depressed Affect: Flat Anxiety Level: Minimal Thought Processes: Coherent Judgement: Unimpaired Orientation: Person, Place,  Time, Situation, Appropriate for developmental age Obsessive Compulsive Thoughts/Behaviors: None  Cognitive Functioning Concentration: Normal Memory: Recent Intact, Remote Intact Is patient IDD: No Insight: Fair Impulse Control: Fair Appetite: Good Have you had any weight changes? : No Change Sleep: No Change Total Hours of Sleep: 8 Vegetative Symptoms: None  ADLScreening [facility] Assessment Services) Patient's cognitive ability adequate to safely complete daily activities?: Yes Patient able to express need for assistance with ADLs?: Yes Independently performs ADLs?: Yes (appropriate for developmental age)  Prior Inpatient Therapy Prior Inpatient Therapy: Yes Prior Therapy Dates: 11/11/2019, 01/2018 Prior Therapy Facilty/Provider(s): [facility] Reason for Treatment: SI  Prior Outpatient Therapy Prior Outpatient Therapy: Yes Prior Therapy Dates: Currently Prior Therapy Facilty/Provider(s): Unknown Reason for Treatment: Unknown Does patient have an ACCT team?: No Does patient have Intensive In-House Services?  : No Does patient have [facility] services? : No Does patient have P4CC services?: No  ADL Screening (condition at time of admission) Patient's cognitive ability adequate to safely complete daily activities?: Yes Is the patient deaf or have difficulty hearing?: No Does the patient have difficulty seeing, even when wearing glasses/contacts?: No Does the patient have difficulty concentrating, remembering, or making decisions?: No Patient able to express need for assistance with ADLs?: Yes Does the patient have difficulty dressing or bathing?: No Independently performs ADLs?: Yes (appropriate for developmental age) Does the patient have difficulty walking or climbing stairs?: No Weakness of Legs: None Weakness of Arms/Hands: None  Home Assistive Devices/Equipment Home Assistive Devices/Equipment: None  Therapy Consults (therapy consults require a physician order) PT  Evaluation Needed: No OT Evalulation Needed: No SLP  Evaluation Needed: No Abuse/Neglect Assessment (Assessment to be complete while patient is alone) Abuse/Neglect Assessment Can Be Completed: Yes Physical Abuse: Denies Verbal Abuse: Denies Sexual Abuse: Denies Exploitation of patient/patient's resources: Denies Self-Neglect: Denies Values / Beliefs Cultural Requests During Hospitalization: None Spiritual Requests During Hospitalization: None Consults Spiritual Care Consult Needed: No Transition of Care Team Consult Needed: No Advance Directives (For Healthcare) Does Patient Have a Medical Advance Directive?: No          Disposition: Per Psyc NP patient to be observed overnight and reassessed in the morning. Disposition Initial Assessment Completed for this Encounter: Yes Disposition of Patient:  (Observe Overnight and Reassess in the morning)  On Site Evaluation by:   Reviewed with Physician:    Benay Pike MS LCASA 01/05/2020 1:45 AM

## 2020-01-05 NOTE — Progress Notes (Signed)
REMS Clozapine program Update:  Pt is Eliqible Patient & MD registered; Labs entered and acceptable.  Per Clozapine REMS program, acceptable to continue medication.   Valrie Hart, PharmD Clinical Pharmacist

## 2020-01-05 NOTE — ED Notes (Signed)
Hourly rounding reveals patient sleeping in room. No complaints, stable, in no acute distress. Q15 minute rounds and monitoring via Security Cameras to continue. 

## 2020-01-09 LAB — CLOZAPINE (CLOZARIL)
Clozapine Lvl: 1329 ng/mL — ABNORMAL HIGH (ref 350–650)
NorClozapine: 539 ng/mL
Total(Cloz+Norcloz): 1868 ng/mL

## 2020-01-12 DIAGNOSIS — F209 Schizophrenia, unspecified: Secondary | ICD-10-CM | POA: Diagnosis not present

## 2020-01-12 DIAGNOSIS — Z79899 Other long term (current) drug therapy: Secondary | ICD-10-CM | POA: Diagnosis not present

## 2020-01-25 ENCOUNTER — Other Ambulatory Visit: Payer: Self-pay

## 2020-01-25 MED ORDER — FORA V30A BLOOD GLUCOSE TEST VI STRP: ORAL_STRIP | 12 refills | Status: DC

## 2020-01-29 ENCOUNTER — Other Ambulatory Visit: Payer: Self-pay

## 2020-01-29 ENCOUNTER — Encounter: Payer: Self-pay | Admitting: Podiatry

## 2020-01-29 ENCOUNTER — Ambulatory Visit (INDEPENDENT_AMBULATORY_CARE_PROVIDER_SITE_OTHER): Payer: Medicare Other | Admitting: Podiatry

## 2020-01-29 DIAGNOSIS — M79676 Pain in unspecified toe(s): Secondary | ICD-10-CM

## 2020-01-29 DIAGNOSIS — Z7901 Long term (current) use of anticoagulants: Secondary | ICD-10-CM

## 2020-01-29 DIAGNOSIS — B351 Tinea unguium: Secondary | ICD-10-CM | POA: Diagnosis not present

## 2020-01-29 DIAGNOSIS — E119 Type 2 diabetes mellitus without complications: Secondary | ICD-10-CM

## 2020-01-29 DIAGNOSIS — Q828 Other specified congenital malformations of skin: Secondary | ICD-10-CM

## 2020-01-29 NOTE — Progress Notes (Signed)
This patient returns to my office for at risk foot care.  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  Patient is taking eliquiss.  . This patient is unable to cut nails himself since the patient cannot reach his nails.These nails are painful walking and wearing shoes.  This patient presents for at risk foot care today.  General Appearance  Alert, conversant and in no acute stress.  Vascular  Dorsalis pedis and posterior tibial  pulses are palpable  bilaterally.  Capillary return is within normal limits  bilaterally. Temperature is within normal limits  bilaterally.  Neurologic  Senn-Weinstein monofilament wire test within normal limits  bilaterally. Muscle power within normal limits bilaterally.  Nails Thick disfigured discolored nails with subungual debris  from hallux to fifth toes bilaterally. No evidence of bacterial infection or drainage bilaterally. Nail spicules noted hallux nails  B/L.  Orthopedic  No limitations of motion  feet .  No crepitus or effusions noted.  No bony pathology or digital deformities noted.  Skin  normotropic skin  noted bilaterally.  No signs of infections or ulcers noted.   Onychomycosis  Pain in right toes  Pain in left toes    Consent was obtained for treatment procedures.   Mechanical debridement of nails 1-5  bilaterally performed with a nail nipper.  Filed with dremel without incident.    Return office visit   4 months                   Told patient to return for periodic foot care and evaluation due to potential at risk complications.   Helane Gunther DPM

## 2020-01-30 ENCOUNTER — Ambulatory Visit (INDEPENDENT_AMBULATORY_CARE_PROVIDER_SITE_OTHER): Payer: Medicare Other | Admitting: Internal Medicine

## 2020-01-30 ENCOUNTER — Encounter: Payer: Self-pay | Admitting: Internal Medicine

## 2020-01-30 VITALS — BP 116/71 | HR 86 | Temp 98.0°F | Ht 70.0 in | Wt 188.0 lb

## 2020-01-30 DIAGNOSIS — F203 Undifferentiated schizophrenia: Secondary | ICD-10-CM | POA: Diagnosis not present

## 2020-01-30 DIAGNOSIS — I10 Essential (primary) hypertension: Secondary | ICD-10-CM

## 2020-01-30 DIAGNOSIS — Z86711 Personal history of pulmonary embolism: Secondary | ICD-10-CM

## 2020-01-30 DIAGNOSIS — E782 Mixed hyperlipidemia: Secondary | ICD-10-CM

## 2020-01-30 DIAGNOSIS — E119 Type 2 diabetes mellitus without complications: Secondary | ICD-10-CM

## 2020-01-30 NOTE — Assessment & Plan Note (Signed)
Admitted in the last 1 month in the hospital for psychotic disorder which has resolved now.  Hallucinationor delusions. Not present

## 2020-01-30 NOTE — Assessment & Plan Note (Addendum)
Is taking statin for that

## 2020-01-30 NOTE — Assessment & Plan Note (Signed)
Pt's BP is under control on lisinopril. BP today was 116/71.

## 2020-01-30 NOTE — Assessment & Plan Note (Signed)
Blood sugar is stable, on Levemir

## 2020-01-30 NOTE — Progress Notes (Signed)
Lance [name] is a 44 y.o. male here for a f/u. Pt has no new concerns.

## 2020-01-30 NOTE — Progress Notes (Signed)
Established Patient Office Visit  SUBJECTIVE:  Subjective  Patient ID: Lance [name], male    DOB: 1976-04-01  Age: 44 y.o. MRN: [id number]  CC:  Chief Complaint  Patient presents with  . Follow-up    HPI Lance [name] is a 44 y.o. male presenting today for a routine follow up.  He said that he has been ok. He has been to the ED a few times over the last several months for his mental health. He has been stable more recently. He has been watching his physical health otherwise. He notes that his diabetes has been regulated with his insulin. His last glucose was 108 on 01/05/2020.   Past Medical History:  Diagnosis Date  . Depression   . Diabetes mellitus without complication (HCC)   . Hyperlipidemia   . Hypertension   . Lupus anticoagulant disorder (HCC) 06/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  . PE (pulmonary thromboembolism) (HCC)   . Schizo affective schizophrenia (HCC)   . Supratherapeutic INR 11/19/2016    No past surgical history on file.  Family History  Problem Relation Age of Onset  . CAD Mother   . CAD Sister     Social History   Socioeconomic History  . Marital status: Single    Spouse name: Not on file  . Number of children: Not on file  . Years of education: Not on file  . Highest education level: Not on file  Occupational History  . Not on file  Tobacco Use  . Smoking status: Current Every Day Smoker    Packs/day: 1.00    Years: 23.00    Pack years: 23.00    Types: Cigarettes  . Smokeless tobacco: Never Used  . Tobacco comment: will provide material  Substance and Sexual Activity  . Alcohol use: No    Comment: occassionally  . Drug use: Not Currently    Types: Marijuana    Comment: Last use 11/29/16  . Sexual activity: Never    Birth control/protection: None    Comment: occasional marijuana- none recently  Other Topics Concern  . Not on file  Social History Narrative   From a group home in [place]   Social  Determinants of Health   Financial Resource Strain:   . Difficulty of Paying Living Expenses:   Food Insecurity:   . Worried About Programme researcher, broadcasting/film/video in the Last Year:   . Barista in the Last Year:   Transportation Needs:   . Freight forwarder (Medical):   Marland Kitchen Lack of Transportation (Non-Medical):   Physical Activity:   . Days of Exercise per Week:   . Minutes of Exercise per Session:   Stress:   . Feeling of Stress :   Social Connections:   . Frequency of Communication with Friends and Family:   . Frequency of Social Gatherings with Friends and Family:   . Attends Religious Services:   . Active Member of Clubs or Organizations:   . Attends Banker Meetings:   Marland Kitchen Marital Status:   Intimate Partner Violence:   . Fear of Current or Ex-Partner:   . Emotionally Abused:   Marland Kitchen Physically Abused:   . Sexually Abused:      Current Outpatient Medications:  .  albuterol (VENTOLIN HFA) 108 (90 Base) MCG/ACT inhaler, Inhale 1-2 puffs into the lungs every 6 (six) hours as needed for wheezing or shortness of breath., Disp: 18 g, Rfl: 1 .  ammonium lactate (AMLACTIN) 12 % cream, APPY TOPICALLY AS NEEDED FOR DRY SKIN, Disp: 385 g, Rfl: 10 .  apixaban (ELIQUIS) 5 MG TABS tablet, Take 1 tablet (5 mg total) by mouth 2 (two) times daily., Disp: 60 tablet, Rfl: 1 .  cloZAPine (CLOZARIL) 100 MG tablet, Take 5 tablets (500 mg total) by mouth at bedtime., Disp: 150 tablet, Rfl: 1 .  fluticasone furoate-vilanterol (BREO ELLIPTA) 200-25 MCG/INH AEPB, Inhale 1 puff into the lungs daily., Disp: 30 each, Rfl: 6 .  glucose blood (FORA V30A BLOOD GLUCOSE TEST) test strip, Use as instructed, Disp: 100 each, Rfl: 12 .  LEVEMIR FLEXTOUCH 100 UNIT/ML FlexPen, Inject into the skin., Disp: , Rfl:  .  lisinopril (ZESTRIL) 2.5 MG tablet, Take 1 tablet (2.5 mg total) by mouth daily., Disp: 30 tablet, Rfl: 1 .  lithium carbonate (ESKALITH) 450 MG CR tablet, Take 1 tablet (450 mg total) by mouth  every 12 (twelve) hours., Disp: 60 tablet, Rfl: 1 .  metFORMIN (GLUCOPHAGE) 1000 MG tablet, Take 1 tablet (1,000 mg total) by mouth 2 (two) times daily with a meal., Disp: 60 tablet, Rfl: 1 .  NOVOFINE AUTOCOVER 30G X 8 MM MISC, , Disp: , Rfl:  .  sertraline (ZOLOFT) 50 MG tablet, Take 1 tablet (50 mg total) by mouth daily., Disp: 30 tablet, Rfl: 1 .  simvastatin (ZOCOR) 40 MG tablet, Take 1 tablet (40 mg total) by mouth daily at 6 PM., Disp: 30 tablet, Rfl: 6 .  traZODone (DESYREL) 100 MG tablet, Take 1 tablet (100 mg total) by mouth at bedtime., Disp: 30 tablet, Rfl: 1   Allergies  Allergen Reactions  . Penicillins Other (See Comments)    Reaction: "lockjaw" Has patient had a PCN reaction causing immediate rash, facial/tongue/throat swelling, SOB or lightheadedness with hypotension: No Has patient had a PCN reaction causing severe rash involving mucus membranes or skin necrosis: No Has patient had a PCN reaction that required hospitalization: No Has patient had a PCN reaction occurring within the last 10 years: No If all of the above answers are "NO", then may proceed with Cephalosporin use.     ROS Review of Systems  Constitutional: Negative.   HENT: Negative.   Eyes: Negative.   Respiratory: Negative.   Cardiovascular: Negative.   Gastrointestinal: Negative.   Endocrine: Negative.   Genitourinary: Negative.   Musculoskeletal: Negative.   Skin: Negative.   Allergic/Immunologic: Negative.   Neurological: Negative.   Hematological: Negative.   Psychiatric/Behavioral: Negative.  Negative for behavioral problems, confusion, dysphoric mood, hallucinations and self-injury. The patient is not nervous/anxious.   All other systems reviewed and are negative.    OBJECTIVE:    Physical Exam Vitals reviewed.  Constitutional:      Appearance: Normal appearance.  HENT:     Mouth/Throat:     Mouth: Mucous membranes are moist.  Eyes:     Pupils: Pupils are equal, round, and reactive  to light.  Neck:     Vascular: No carotid bruit.  Cardiovascular:     Rate and Rhythm: Normal rate and regular rhythm.     Pulses: Normal pulses.     Heart sounds: Normal heart sounds.  Pulmonary:     Effort: Pulmonary effort is normal.     Breath sounds: Normal breath sounds.  Abdominal:     General: Bowel sounds are normal.     Palpations: Abdomen is soft. There is no hepatomegaly, splenomegaly or mass.     Tenderness: There is no abdominal tenderness.  Hernia: No hernia is present.  Musculoskeletal:     Cervical back: Neck supple.     Right lower leg: No edema.     Left lower leg: No edema.  Skin:    Findings: No rash.  Neurological:     Mental Status: He is alert and oriented to person, place, and time.     Motor: No weakness.  Psychiatric:        Mood and Affect: Mood normal.        Behavior: Behavior normal.     BP 116/71   Pulse 86   Temp 98 F (36.7 C)   Ht 5\' 10"  (1.778 m)   Wt 188 lb (85.3 kg)   BMI 26.98 kg/m  Wt Readings from Last 3 Encounters:  01/30/20 188 lb (85.3 kg)  01/04/20 200 lb (90.7 kg)  11/10/19 190 lb (86.2 kg)    Health Maintenance Due  Topic Date Due  . Hepatitis C Screening  Never done  . OPHTHALMOLOGY EXAM  Never done  . COVID-19 Vaccine (1) Never done  . TETANUS/TDAP  Never done  . INFLUENZA VACCINE  01/14/2020    There are no preventive care reminders to display for this patient.  CBC Latest Ref Rng & Units 01/04/2020 01/04/2020 11/14/2019  WBC 4.0 - 10.5 K/uL 4.2 4.3 5.2  Hemoglobin 13.0 - 17.0 g/dL 16.115.4 09.615.6 04.514.5  Hematocrit 39 - 52 % 45.1 43.1 42.8  Platelets 150 - 400 K/uL 201 187 196   CMP Latest Ref Rng & Units 01/04/2020 11/10/2019 03/17/2019  Glucose 70 - 99 mg/dL 409(W121(H) 119(J151(H) 478(G195(H)  BUN 6 - 20 mg/dL 19 17 19   Creatinine 0.61 - 1.24 mg/dL 9.561.10 2.131.13 0.861.01  Sodium 135 - 145 mmol/L 142 139 142  Potassium 3.5 - 5.1 mmol/L 3.8 3.8 3.6  Chloride 98 - 111 mmol/L 111 106 111  CO2 22 - 32 mmol/L 22 22 24   Calcium 8.9 -  10.3 mg/dL 57.810.1 10.6(H) 9.7  Total Protein 6.5 - 8.1 g/dL 7.2 7.3 6.6  Total Bilirubin 0.3 - 1.2 mg/dL 0.7 0.6 0.6  Alkaline Phos 38 - 126 U/L 51 56 51  AST 15 - 41 U/L 18 21 32  ALT 0 - 44 U/L 16 20 40    Lab Results  Component Value Date   TSH 3.619 11/11/2019   Lab Results  Component Value Date   ALBUMIN 5.0 01/04/2020   ANIONGAP 9 01/04/2020   Lab Results  Component Value Date   CHOL 166 01/16/2018   CHOL 119 09/22/2017   CHOL 131 03/03/2017   HDL 30 (L) 01/16/2018   HDL 27 (L) 09/22/2017   HDL 31 (L) 03/03/2017   LDLCALC UNABLE TO CALCULATE IF TRIGLYCERIDE OVER 400 mg/dL 46/96/295208/09/2017   LDLCALC 37 09/22/2017   LDLCALC 59 03/03/2017   CHOLHDL 5.5 01/16/2018   CHOLHDL 4.4 09/22/2017   CHOLHDL 4.2 03/03/2017   Lab Results  Component Value Date   TRIG 432 (H) 01/16/2018   Lab Results  Component Value Date   HGBA1C 5.1 11/11/2019   HGBA1C 5.1 01/16/2018   HGBA1C 4.7 (L) 09/21/2017      ASSESSMENT & PLAN:   Problem List Items Addressed This Visit      Cardiovascular and Mediastinum   Hypertension    Pt's BP is under control on lisinopril. BP today was 116/71.        Endocrine   Diabetes mellitus without complication (HCC) - Primary    Blood sugar is stable,  on Levemir        Other   Hyperlipidemia    Is taking statin for that      History of pulmonary embolism    Chronic problem no recurrence patient is on Eliquis he has a problem with chronic coagulopathy      RESOLVED: Schizophrenia (HCC)    Admitted in the last 1 month in the hospital for psychotic disorder which has resolved now.  Hallucinationor delusions. Not present         No orders of the defined types were placed in this encounter.   Follow-up: No follow-ups on file.    Dr. Woodroe Chen [facility] [address] [place], [place] [address]   By signing my name below, I, [employer], attest that this documentation has been prepared under the direction and in the  presence of Corky Downs, MD. Electronically Signed: Corky Downs, MD 01/30/20, 11:39 AM   I personally performed the services described in this documentation, which was SCRIBED in my presence. The recorded information has been reviewed and considered accurate. It has been edited as necessary during review. Corky Downs, MD

## 2020-01-30 NOTE — Assessment & Plan Note (Signed)
Chronic problem no recurrence patient is on Eliquis he has a problem with chronic coagulopathy

## 2020-03-20 DIAGNOSIS — F209 Schizophrenia, unspecified: Secondary | ICD-10-CM | POA: Diagnosis not present

## 2020-03-20 DIAGNOSIS — Z79899 Other long term (current) drug therapy: Secondary | ICD-10-CM | POA: Diagnosis not present

## 2020-04-08 DIAGNOSIS — Z20828 Contact with and (suspected) exposure to other viral communicable diseases: Secondary | ICD-10-CM | POA: Diagnosis not present

## 2020-04-08 DIAGNOSIS — J029 Acute pharyngitis, unspecified: Secondary | ICD-10-CM | POA: Diagnosis not present

## 2020-04-08 DIAGNOSIS — J3489 Other specified disorders of nose and nasal sinuses: Secondary | ICD-10-CM | POA: Diagnosis not present

## 2020-04-29 ENCOUNTER — Other Ambulatory Visit: Payer: Self-pay | Admitting: *Deleted

## 2020-04-29 MED ORDER — APIXABAN 5 MG PO TABS: 5.0000 mg | ORAL_TABLET | Freq: Two times a day (BID) | ORAL | 6 refills | Status: DC

## 2020-04-29 MED ORDER — APIXABAN 5 MG PO TABS: 5.0000 mg | ORAL_TABLET | Freq: Two times a day (BID) | ORAL | 1 refills | Status: DC

## 2020-05-02 ENCOUNTER — Other Ambulatory Visit: Payer: Self-pay

## 2020-05-03 ENCOUNTER — Other Ambulatory Visit: Payer: Self-pay

## 2020-05-30 ENCOUNTER — Encounter: Payer: Self-pay | Admitting: Podiatry

## 2020-05-30 ENCOUNTER — Ambulatory Visit (INDEPENDENT_AMBULATORY_CARE_PROVIDER_SITE_OTHER): Payer: Medicare Other | Admitting: Podiatry

## 2020-05-30 ENCOUNTER — Other Ambulatory Visit: Payer: Self-pay

## 2020-05-30 DIAGNOSIS — Z5181 Encounter for therapeutic drug level monitoring: Secondary | ICD-10-CM | POA: Diagnosis not present

## 2020-05-30 DIAGNOSIS — Z7901 Long term (current) use of anticoagulants: Secondary | ICD-10-CM | POA: Diagnosis not present

## 2020-05-30 DIAGNOSIS — E119 Type 2 diabetes mellitus without complications: Secondary | ICD-10-CM

## 2020-05-30 DIAGNOSIS — M79676 Pain in unspecified toe(s): Secondary | ICD-10-CM | POA: Diagnosis not present

## 2020-05-30 DIAGNOSIS — B351 Tinea unguium: Secondary | ICD-10-CM | POA: Diagnosis not present

## 2020-05-30 DIAGNOSIS — Q828 Other specified congenital malformations of skin: Secondary | ICD-10-CM | POA: Diagnosis not present

## 2020-05-30 NOTE — Progress Notes (Signed)
This patient returns to my office for at risk foot care.  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  Patient is taking eliquiss.  . This patient is unable to cut nails himself since the patient cannot reach his nails.These nails are painful walking and wearing shoes.  This patient presents for at risk foot care today.  General Appearance  Alert, conversant and in no acute stress.  Vascular  Dorsalis pedis and posterior tibial  pulses are palpable  bilaterally.  Capillary return is within normal limits  bilaterally. Temperature is within normal limits  bilaterally.  Neurologic  Senn-Weinstein monofilament wire test within normal limits  bilaterally. Muscle power within normal limits bilaterally.  Nails Thick disfigured discolored nails with subungual debris  from hallux to fifth toes bilaterally. No evidence of bacterial infection or drainage bilaterally. Nail spicules noted hallux nails  B/L.  Orthopedic  No limitations of motion  feet .  No crepitus or effusions noted.  No bony pathology or digital deformities noted.  Skin  normotropic skin  noted bilaterally.  No signs of infections or ulcers noted.  Porokeratosis sub 4 right foot.  Onychomycosis  Pain in right toes  Pain in left toes    Consent was obtained for treatment procedures.   Mechanical debridement of nails 1-5  bilaterally performed with a nail nipper.  Filed with dremel without incident. Debride porokeratosis sub 4  With # 15 blade.   Return office visit   4 months                   Told patient to return for periodic foot care and evaluation due to potential at risk complications.   Briele Lagasse DPM  

## 2020-06-20 ENCOUNTER — Other Ambulatory Visit (HOSPITAL_COMMUNITY): Payer: Self-pay | Admitting: Psychiatry

## 2020-06-24 ENCOUNTER — Other Ambulatory Visit (HOSPITAL_COMMUNITY): Payer: Self-pay | Admitting: Psychiatry

## 2020-07-11 ENCOUNTER — Other Ambulatory Visit (HOSPITAL_COMMUNITY): Payer: Self-pay | Admitting: Psychiatry

## 2020-07-14 ENCOUNTER — Emergency Department: Payer: Medicare Other

## 2020-07-14 ENCOUNTER — Encounter: Payer: Self-pay | Admitting: Emergency Medicine

## 2020-07-14 ENCOUNTER — Other Ambulatory Visit: Payer: Self-pay

## 2020-07-14 ENCOUNTER — Emergency Department
Admission: EM | Admit: 2020-07-14 | Discharge: 2020-07-15 | Disposition: A | Discharge status: Home / Self Care | Payer: Medicare Other | Attending: Emergency Medicine | Admitting: Emergency Medicine

## 2020-07-14 DIAGNOSIS — E119 Type 2 diabetes mellitus without complications: Secondary | ICD-10-CM | POA: Diagnosis not present

## 2020-07-14 DIAGNOSIS — Z86711 Personal history of pulmonary embolism: Secondary | ICD-10-CM | POA: Diagnosis present

## 2020-07-14 DIAGNOSIS — Z7984 Long term (current) use of oral hypoglycemic drugs: Secondary | ICD-10-CM | POA: Insufficient documentation

## 2020-07-14 DIAGNOSIS — Z7901 Long term (current) use of anticoagulants: Secondary | ICD-10-CM | POA: Diagnosis not present

## 2020-07-14 DIAGNOSIS — R441 Visual hallucinations: Secondary | ICD-10-CM | POA: Diagnosis not present

## 2020-07-14 DIAGNOSIS — F319 Bipolar disorder, unspecified: Secondary | ICD-10-CM | POA: Diagnosis present

## 2020-07-14 DIAGNOSIS — Z794 Long term (current) use of insulin: Secondary | ICD-10-CM | POA: Insufficient documentation

## 2020-07-14 DIAGNOSIS — I10 Essential (primary) hypertension: Secondary | ICD-10-CM | POA: Diagnosis present

## 2020-07-14 DIAGNOSIS — R112 Nausea with vomiting, unspecified: Secondary | ICD-10-CM | POA: Diagnosis present

## 2020-07-14 DIAGNOSIS — Z20822 Contact with and (suspected) exposure to covid-19: Secondary | ICD-10-CM | POA: Insufficient documentation

## 2020-07-14 DIAGNOSIS — F329 Major depressive disorder, single episode, unspecified: Secondary | ICD-10-CM | POA: Insufficient documentation

## 2020-07-14 DIAGNOSIS — R45851 Suicidal ideations: Secondary | ICD-10-CM | POA: Insufficient documentation

## 2020-07-14 DIAGNOSIS — F250 Schizoaffective disorder, bipolar type: Secondary | ICD-10-CM | POA: Diagnosis not present

## 2020-07-14 DIAGNOSIS — Z760 Encounter for issue of repeat prescription: Secondary | ICD-10-CM | POA: Diagnosis not present

## 2020-07-14 DIAGNOSIS — Z79899 Other long term (current) drug therapy: Secondary | ICD-10-CM | POA: Insufficient documentation

## 2020-07-14 DIAGNOSIS — R1084 Generalized abdominal pain: Secondary | ICD-10-CM | POA: Diagnosis not present

## 2020-07-14 DIAGNOSIS — F17210 Nicotine dependence, cigarettes, uncomplicated: Secondary | ICD-10-CM | POA: Insufficient documentation

## 2020-07-14 DIAGNOSIS — R440 Auditory hallucinations: Secondary | ICD-10-CM | POA: Insufficient documentation

## 2020-07-14 DIAGNOSIS — F32A Depression, unspecified: Secondary | ICD-10-CM

## 2020-07-14 DIAGNOSIS — E785 Hyperlipidemia, unspecified: Secondary | ICD-10-CM | POA: Diagnosis present

## 2020-07-14 DIAGNOSIS — R443 Hallucinations, unspecified: Secondary | ICD-10-CM

## 2020-07-14 LAB — LIPASE, BLOOD: Lipase: 30 U/L (ref 11–51)

## 2020-07-14 LAB — CBC WITH DIFFERENTIAL/PLATELET
Abs Immature Granulocytes: 0.01 10*3/uL (ref 0.00–0.07)
Basophils Absolute: 0 10*3/uL (ref 0.0–0.1)
Basophils Relative: 0 %
Eosinophils Absolute: 0 10*3/uL (ref 0.0–0.5)
Eosinophils Relative: 0 %
HCT: 44.5 % (ref 39.0–52.0)
Hemoglobin: 15.6 g/dL (ref 13.0–17.0)
Immature Granulocytes: 0 %
Lymphocytes Relative: 22 %
Lymphs Abs: 1.2 10*3/uL (ref 0.7–4.0)
MCH: 32.4 pg (ref 26.0–34.0)
MCHC: 35.1 g/dL (ref 30.0–36.0)
MCV: 92.3 fL (ref 80.0–100.0)
Monocytes Absolute: 0.7 10*3/uL (ref 0.1–1.0)
Monocytes Relative: 12 %
Neutro Abs: 3.8 10*3/uL (ref 1.7–7.7)
Neutrophils Relative %: 66 %
Platelets: 239 10*3/uL (ref 150–400)
RBC: 4.82 MIL/uL (ref 4.22–5.81)
RDW: 12.4 % (ref 11.5–15.5)
WBC: 5.7 10*3/uL (ref 4.0–10.5)
nRBC: 0 % (ref 0.0–0.2)

## 2020-07-14 LAB — CBG MONITORING, ED
Glucose-Capillary: 197 mg/dL — ABNORMAL HIGH (ref 70–99)
Glucose-Capillary: 87 mg/dL (ref 70–99)
Glucose-Capillary: 92 mg/dL (ref 70–99)

## 2020-07-14 LAB — COMPREHENSIVE METABOLIC PANEL
ALT: 21 U/L (ref 0–44)
AST: 23 U/L (ref 15–41)
Albumin: 4.9 g/dL (ref 3.5–5.0)
Alkaline Phosphatase: 65 U/L (ref 38–126)
Anion gap: 13 (ref 5–15)
BUN: 14 mg/dL (ref 6–20)
CO2: 22 mmol/L (ref 22–32)
Calcium: 9.7 mg/dL (ref 8.9–10.3)
Chloride: 108 mmol/L (ref 98–111)
Creatinine, Ser: 1.01 mg/dL (ref 0.61–1.24)
GFR, Estimated: >60 mL/min (ref >60)
Glucose, Bld: 105 mg/dL — ABNORMAL HIGH (ref 70–99)
Potassium: 4.2 mmol/L (ref 3.5–5.1)
Sodium: 143 mmol/L (ref 135–145)
Total Bilirubin: 0.9 mg/dL (ref 0.3–1.2)
Total Protein: 7.5 g/dL (ref 6.5–8.1)

## 2020-07-14 LAB — CBC
HCT: 43.9 % (ref 39.0–52.0)
Hemoglobin: 15.2 g/dL (ref 13.0–17.0)
MCH: 31.9 pg (ref 26.0–34.0)
MCHC: 34.6 g/dL (ref 30.0–36.0)
MCV: 92.2 fL (ref 80.0–100.0)
Platelets: 227 10*3/uL (ref 150–400)
RBC: 4.76 MIL/uL (ref 4.22–5.81)
RDW: 12.3 % (ref 11.5–15.5)
WBC: 5.8 10*3/uL (ref 4.0–10.5)
nRBC: 0 % (ref 0.0–0.2)

## 2020-07-14 LAB — HEMOGLOBIN A1C
Hgb A1c MFr Bld: 5.3 % (ref 4.8–5.6)
Mean Plasma Glucose: 105.41 mg/dL

## 2020-07-14 LAB — LITHIUM LEVEL: Lithium Lvl: 0.86 mmol/L (ref 0.60–1.20)

## 2020-07-14 LAB — ETHANOL: Alcohol, Ethyl (B): <10 mg/dL (ref <10)

## 2020-07-14 MED ORDER — DROPERIDOL 2.5 MG/ML IJ SOLN: 2.5000 mg | Freq: Once | INTRAMUSCULAR | Status: DC

## 2020-07-14 MED ORDER — SERTRALINE HCL 50 MG PO TABS: 50.0000 mg | ORAL_TABLET | Freq: Every day | ORAL | Status: DC

## 2020-07-14 MED ORDER — INSULIN ASPART 100 UNIT/ML ~~LOC~~ SOLN
0.0000 [IU] | SUBCUTANEOUS | Status: DC
  Administered 2020-07-14: 3 [IU] via SUBCUTANEOUS
  Administered 2020-07-15: 2 [IU] via SUBCUTANEOUS
  Administered 2020-07-15: 3 [IU] via SUBCUTANEOUS
  Administered 2020-07-15: 2 [IU] via SUBCUTANEOUS
  Filled 2020-07-14 (×3): qty 1

## 2020-07-14 MED ORDER — LITHIUM CARBONATE ER 450 MG PO TBCR
450.0000 mg | EXTENDED_RELEASE_TABLET | Freq: Two times a day (BID) | ORAL | Status: DC
  Administered 2020-07-14 – 2020-07-15 (×2): 450 mg via ORAL
  Filled 2020-07-14 (×4): qty 1

## 2020-07-14 MED ORDER — ONDANSETRON 4 MG PO TBDP
4.0000 mg | ORAL_TABLET | Freq: Once | ORAL | Status: AC
  Administered 2020-07-14: 4 mg via ORAL
  Filled 2020-07-14: qty 1

## 2020-07-14 MED ORDER — SERTRALINE HCL 50 MG PO TABS
50.0000 mg | ORAL_TABLET | Freq: Every day | ORAL | Status: DC
  Administered 2020-07-15: 50 mg via ORAL
  Filled 2020-07-14: qty 1

## 2020-07-14 MED ORDER — HALOPERIDOL LACTATE 5 MG/ML IJ SOLN
5.0000 mg | Freq: Once | INTRAMUSCULAR | Status: AC
  Administered 2020-07-14: 5 mg via INTRAMUSCULAR
  Filled 2020-07-14: qty 1

## 2020-07-14 MED ORDER — DIPHENHYDRAMINE HCL 50 MG/ML IJ SOLN
25.0000 mg | Freq: Once | INTRAMUSCULAR | Status: AC
  Administered 2020-07-14: 25 mg via INTRAMUSCULAR
  Filled 2020-07-14: qty 1

## 2020-07-14 MED ORDER — SIMVASTATIN 40 MG PO TABS
40.0000 mg | ORAL_TABLET | Freq: Every day | ORAL | Status: DC
  Administered 2020-07-14: 40 mg via ORAL
  Filled 2020-07-14 (×2): qty 4

## 2020-07-14 MED ORDER — LISINOPRIL 5 MG PO TABS
2.5000 mg | ORAL_TABLET | Freq: Every day | ORAL | Status: DC
  Administered 2020-07-15: 2.5 mg via ORAL
  Filled 2020-07-14: qty 1

## 2020-07-14 MED ORDER — METFORMIN HCL 500 MG PO TABS
1000.0000 mg | ORAL_TABLET | Freq: Two times a day (BID) | ORAL | Status: DC
  Administered 2020-07-14 – 2020-07-15 (×3): 1000 mg via ORAL
  Filled 2020-07-14 (×3): qty 2

## 2020-07-14 MED ORDER — TRAZODONE HCL 100 MG PO TABS
100.0000 mg | ORAL_TABLET | Freq: Every day | ORAL | Status: DC
  Administered 2020-07-14: 100 mg via ORAL
  Filled 2020-07-14: qty 1

## 2020-07-14 MED ORDER — CLOZAPINE 100 MG PO TABS
500.0000 mg | ORAL_TABLET | Freq: Every day | ORAL | Status: DC
  Administered 2020-07-14: 500 mg via ORAL
  Filled 2020-07-14 (×2): qty 5

## 2020-07-14 MED ORDER — TRAZODONE HCL 100 MG PO TABS: 100.0000 mg | ORAL_TABLET | Freq: Every day | ORAL | Status: DC

## 2020-07-14 MED ORDER — OLANZAPINE 10 MG PO TABS
10.0000 mg | ORAL_TABLET | Freq: Two times a day (BID) | ORAL | Status: DC
  Administered 2020-07-15: 10 mg via ORAL
  Filled 2020-07-14: qty 1

## 2020-07-14 MED ORDER — APIXABAN 5 MG PO TABS
5.0000 mg | ORAL_TABLET | Freq: Two times a day (BID) | ORAL | Status: DC
  Administered 2020-07-14 – 2020-07-15 (×2): 5 mg via ORAL
  Filled 2020-07-14 (×3): qty 1

## 2020-07-14 NOTE — ED Triage Notes (Signed)
Pt reports has not taken his meds lately and now has bad dreams, feels nauseated and feels like he is "going to jump out of his skin".

## 2020-07-14 NOTE — Consult Note (Addendum)
  Pharmacy - Clozapine     This patient's order has been reviewed for prescribing contraindications.   Labs:  07/15/2019 ANC 3900  The medication is being dispensed pursuant to the FDA REMS suspension order of 05/03/20 that allows for dispensing without a patient REMS dispense authorization (RDA).   ANC reported to REMS and a transition dispense rationale was obtained [id number]  Will follow ANC weekly per hospital protocol and report to REMS as able  Albina Billet, PharmD, BCPS Clinical Pharmacist 07/14/2020 2:58 PM

## 2020-07-14 NOTE — ED Provider Notes (Signed)
[facility] Emergency Department Provider Note  ____________________________________________   Event Date/Time   First MD Initiated Contact with Patient 07/14/20 1200     (approximate)  I have reviewed the triage vital signs and the nursing notes.   HISTORY  Chief Complaint Mental Health Problem   HPI Lance [name] is a 45 y.o. male with a past medical history of HTN, HDL, diabetes, lupus anticoagulant disorder and PE on Eliquis, schizoaffective disorder and depression who presents from his group home complaining of not having access to his clozapine for the last 3 days and developing some nausea nonbilious vomiting yesterday.  States he is been hearing more voices and seeing things that are not there than usual.  He states he is having severe nightmares and feels like he is congested of his skin.  He denies any HI but does endorse chronic suicidal thoughts does not have any plan to kill himself at this time.  He denies any headache, earache, sore throat, fevers, chest pain, cough, shortness of breath abdominal pain, back pain, rash, extremity pain, urinary symptoms, diarrhea, or other acute sick symptoms.  No clearly feeding every factors.  Patient believes he is withdrawing from his clozapine.  He denies regular EtOH use, tobacco use or illicit drug use.         Past Medical History:  Diagnosis Date  . Depression   . Diabetes mellitus without complication (HCC)   . Hyperlipidemia   . Hypertension   . Lupus anticoagulant disorder (HCC) 06/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  . PE (pulmonary thromboembolism) (HCC)   . Schizo affective schizophrenia (HCC)   . Supratherapeutic INR 11/19/2016    Patient Active Problem List   Diagnosis Date Noted  . Alcohol abuse 04/12/2018  . Tobacco use disorder 09/01/2016  . Schizoaffective disorder, bipolar type (HCC) 05/22/2016  . Diabetes mellitus without complication (HCC) 05/21/2016  .  Hyperlipidemia 05/21/2016  . History of pulmonary embolism 05/21/2016  . Chronic anticoagulation 05/21/2016  . Hypertension 09/25/2015    History reviewed. No pertinent surgical history.  Prior to Admission medications   Medication Sig Start Date End Date Taking? Authorizing Provider  albuterol (VENTOLIN HFA) 108 (90 Base) MCG/ACT inhaler Inhale 1-2 puffs into the lungs every 6 (six) hours as needed for wheezing or shortness of breath. 11/14/19   Clapacs, [name], MD  ammonium lactate (AMLACTIN) 12 % cream APPY TOPICALLY AS NEEDED FOR DRY SKIN 01/05/20   Helane Gunther, DPM  apixaban (ELIQUIS) 5 MG TABS tablet Take 1 tablet (5 mg total) by mouth 2 (two) times daily. 04/29/20   Corky Downs, MD  cloZAPine (CLOZARIL) 100 MG tablet Take 5 tablets (500 mg total) by mouth at bedtime. 11/14/19   Clapacs, [name], MD  fluticasone furoate-vilanterol (BREO ELLIPTA) 200-25 MCG/INH AEPB Inhale 1 puff into the lungs daily. 11/23/19   Corky Downs, MD  glucose blood (FORA V30A BLOOD GLUCOSE TEST) test strip Use as instructed 01/25/20   Corky Downs, MD  LEVEMIR FLEXTOUCH 100 UNIT/ML FlexPen Inject into the skin. 01/24/20   [provider]  lisinopril (ZESTRIL) 2.5 MG tablet Take 1 tablet (2.5 mg total) by mouth daily. 11/14/19   Clapacs, [name], MD  lithium carbonate (ESKALITH) 450 MG CR tablet Take 1 tablet (450 mg total) by mouth every 12 (twelve) hours. 11/14/19   Clapacs, [name], MD  metFORMIN (GLUCOPHAGE) 1000 MG tablet Take 1 tablet (1,000 mg total) by mouth 2 (two) times daily  with a meal. 11/14/19   Clapacs, [name], MD  NOVOFINE AUTOCOVER 30G X 8 MM MISC  01/22/20   [provider]  sertraline (ZOLOFT) 50 MG tablet Take 1 tablet (50 mg total) by mouth daily. 11/14/19   Clapacs, [name], MD  simvastatin (ZOCOR) 40 MG tablet Take 1 tablet (40 mg total) by mouth daily at 6 PM. 11/23/19   Corky Downs, MD  traZODone (DESYREL) 100 MG tablet Take 1 tablet (100 mg total) by mouth at bedtime. 11/14/19    Clapacs, [name], MD    Allergies Penicillins  Family History  Problem Relation Age of Onset  . CAD Mother   . CAD Sister     Social History Social History   Tobacco Use  . Smoking status: Current Every Day Smoker    Packs/day: 1.00    Years: 23.00    Pack years: 23.00    Types: Cigarettes  . Smokeless tobacco: Never Used  . Tobacco comment: will provide material  Substance Use Topics  . Alcohol use: No    Comment: occassionally  . Drug use: Not Currently    Types: Marijuana    Comment: Last use 11/29/16    Review of Systems  Review of Systems  Constitutional: Negative for chills and fever.  HENT: Negative for sore throat.   Eyes: Negative for pain.  Respiratory: Negative for cough and stridor.   Cardiovascular: Negative for chest pain.  Gastrointestinal: Positive for nausea and vomiting.  Genitourinary: Negative for dysuria.  Musculoskeletal: Negative for myalgias.  Skin: Negative for rash.  Neurological: Negative for seizures, loss of consciousness and headaches.  Psychiatric/Behavioral: Positive for depression and hallucinations. Negative for suicidal ideas. The patient is nervous/anxious and has insomnia.   All other systems reviewed and are negative.     ____________________________________________   PHYSICAL EXAM:  VITAL SIGNS: ED Triage Vitals  Enc Vitals Group     BP --      Pulse --      Resp --      Temp --      Temp src --      SpO2 --      Weight 07/14/20 1145 188 lb 0.8 oz (85.3 kg)     Height 07/14/20 1145 5\' 10"  (1.778 m)     Head Circumference --      Peak Flow --      Pain Score 07/14/20 1142 0     Pain Loc --      Pain Edu? --      Excl. in GC? --    Vitals:   07/14/20 1212  BP: 117/89  Pulse: 78  Resp: 18  Temp: 97.9 F (36.6 C)  SpO2: 100%   Physical Exam Vitals and nursing note reviewed.  Constitutional:      Appearance: Normal appearance. He is well-developed, normal weight and well-nourished.  HENT:     Head:  Normocephalic and atraumatic.     Right Ear: External ear normal.     Left Ear: External ear normal.     Nose: Nose normal.     Mouth/Throat:     Mouth: Mucous membranes are moist.  Eyes:     Conjunctiva/sclera: Conjunctivae normal.  Cardiovascular:     Rate and Rhythm: Normal rate and regular rhythm.     Heart sounds: No murmur heard.   Pulmonary:     Effort: Pulmonary effort is normal. No respiratory distress.     Breath sounds: Normal breath sounds.  Abdominal:     Palpations: Abdomen is soft.     Tenderness: There is no abdominal tenderness.  Musculoskeletal:        General: No edema.     Cervical back: Neck supple.  Skin:    General: Skin is warm and dry.     Capillary Refill: Capillary refill takes less than 2 seconds.  Neurological:     Mental Status: He is alert and oriented to person, place, and time.  Psychiatric:        Attention and Perception: He perceives auditory and visual hallucinations.        Mood and Affect: Mood is anxious and depressed.        Thought Content: Thought content includes suicidal ( chronic) ideation. Thought content does not include homicidal ideation. Thought content does not include homicidal or suicidal plan.      ____________________________________________   LABS (all labs ordered are listed, but only abnormal results are displayed)  Labs Reviewed  COMPREHENSIVE METABOLIC PANEL - Abnormal; Notable for the following components:      Result Value   Glucose, Bld 105 (*)    All other components within normal limits  SARS CORONAVIRUS 2 (TAT 6-24 HRS)  ETHANOL  CBC  LITHIUM LEVEL  URINE DRUG SCREEN, QUALITATIVE ([facility] ONLY)  HEMOGLOBIN A1C  LIPASE, BLOOD   ____________________________________________  EKG  Sinus rhythm with a ventricular rate of 65, right bundle branch block, otherwise unremarkable intervals and some nonspecific ST changes in multiple leads that are all unchanged when compared to ECG obtained 1 year ago on  October 2020 ____________________________________________  RADIOLOGY  ED MD interpretation:   Official radiology report(s): No results found.  ____________________________________________   PROCEDURES  Procedure(s) performed (including Critical Care):  Procedures   ____________________________________________   INITIAL IMPRESSION / ASSESSMENT AND PLAN / ED COURSE      Patient presents with above-stated history and exam for assessment of some nausea and vomiting that is developed in the setting of not taking his clozapine for the past 3 days.  He endorses worsening auditory visual hallucinations from baseline as well as worsening depression and nightmares.  He is afebrile hemodynamically stable arrival.  Suspect patient's symptoms may be related to withdrawal from his clozapine.  He may also have developed an infectious gastroenteritis.  He denies any chest pain is no shortness of breath or other findings suggest atypical presentation of ACS.  His abdomen is soft nontender he has no diarrhea or other symptoms that would suggest enteritis or acute infection in the abdomen pelvis.  In addition his CMP is unremarkable and shows no evidence of cholestasis or hepatitis.  No significant ocular metabolic derangements noted on CMP.  Lipase is also not suggestive of acute pancreatitis.  CBC is unremarkable.  Lithium is within normal limits.  Psychiatry consulted to assist with medication management.  Unclear why patient stopped able to get his clozapine although this could be a problem if patient is in a group home and is able to get this today.  Home meds reordered in the ED.  Psychiatry consulted.  Patient placed in observation  The patient has been placed in psychiatric observation due to the need to provide a safe environment for the patient while obtaining psychiatric consultation and evaluation, as well as ongoing medical and medication management to treat the patient's condition.  The  patient has not been placed under full IVC at this time. ______________________________________   FINAL CLINICAL IMPRESSION(S) / ED DIAGNOSES  Final diagnoses:  Nausea and vomiting, intractability of vomiting not specified, unspecified vomiting type  Medication refill  Hallucinations  Depression, unspecified depression type    Medications  cloZAPine (CLOZARIL) tablet 500 mg (has no administration in time range)  apixaban (ELIQUIS) tablet 5 mg (has no administration in time range)  simvastatin (ZOCOR) tablet 40 mg (has no administration in time range)  traZODone (DESYREL) tablet 100 mg (has no administration in time range)  sertraline (ZOLOFT) tablet 50 mg (has no administration in time range)  insulin aspart (novoLOG) injection 0-15 Units (has no administration in time range)  lisinopril (ZESTRIL) tablet 2.5 mg (has no administration in time range)  lithium carbonate (ESKALITH) CR tablet 450 mg (has no administration in time range)  ondansetron (ZOFRAN-ODT) disintegrating tablet 4 mg (4 mg Oral Given 07/14/20 1306)     ED Discharge Orders    None       Note:  This document was prepared using Dragon voice recognition software and may include unintentional dictation errors.   Gilles Chiquito, MD 07/14/20 [phone]

## 2020-07-14 NOTE — ED Notes (Signed)
Pt has had 2-3 episodes of green emesis since arrival. MD notified. Has tried ginger ale and saltines but has emesis after each of those as well.

## 2020-07-14 NOTE — ED Triage Notes (Signed)
Pt reports has not taken his meds and is having bad dreams and needs an eval

## 2020-07-14 NOTE — ED Notes (Signed)
Spoke with Legal Guardian, [name]. [phone]

## 2020-07-14 NOTE — ED Notes (Signed)
Pt vomited x1 in room. Greenish/bile color.

## 2020-07-14 NOTE — ED Triage Notes (Signed)
Pt reports the facility all the sudden stopped giving him his meds and told him that the pharmacy ran out so he thinks he may be withdrawing

## 2020-07-15 ENCOUNTER — Other Ambulatory Visit: Payer: Self-pay

## 2020-07-15 ENCOUNTER — Encounter: Payer: Self-pay | Admitting: Psychiatry

## 2020-07-15 ENCOUNTER — Inpatient Hospital Stay
Admission: RE | Admit: 2020-07-15 | Discharge: 2020-07-22 | DRG: 885 | Disposition: A | Discharge status: Home / Self Care | Payer: Medicare Other | Source: Intra-hospital | Attending: Behavioral Health | Admitting: Behavioral Health

## 2020-07-15 ENCOUNTER — Other Ambulatory Visit (HOSPITAL_COMMUNITY): Payer: Self-pay | Admitting: Psychiatry

## 2020-07-15 DIAGNOSIS — Z7951 Long term (current) use of inhaled steroids: Secondary | ICD-10-CM

## 2020-07-15 DIAGNOSIS — E785 Hyperlipidemia, unspecified: Secondary | ICD-10-CM | POA: Diagnosis present

## 2020-07-15 DIAGNOSIS — I10 Essential (primary) hypertension: Secondary | ICD-10-CM | POA: Diagnosis present

## 2020-07-15 DIAGNOSIS — F250 Schizoaffective disorder, bipolar type: Secondary | ICD-10-CM | POA: Diagnosis present

## 2020-07-15 DIAGNOSIS — F410 Panic disorder [episodic paroxysmal anxiety] without agoraphobia: Secondary | ICD-10-CM | POA: Diagnosis present

## 2020-07-15 DIAGNOSIS — F17210 Nicotine dependence, cigarettes, uncomplicated: Secondary | ICD-10-CM | POA: Diagnosis present

## 2020-07-15 DIAGNOSIS — F319 Bipolar disorder, unspecified: Secondary | ICD-10-CM | POA: Diagnosis present

## 2020-07-15 DIAGNOSIS — Z23 Encounter for immunization: Secondary | ICD-10-CM | POA: Diagnosis present

## 2020-07-15 DIAGNOSIS — F17200 Nicotine dependence, unspecified, uncomplicated: Secondary | ICD-10-CM | POA: Diagnosis present

## 2020-07-15 DIAGNOSIS — R45851 Suicidal ideations: Secondary | ICD-10-CM | POA: Diagnosis present

## 2020-07-15 DIAGNOSIS — R112 Nausea with vomiting, unspecified: Secondary | ICD-10-CM | POA: Diagnosis not present

## 2020-07-15 DIAGNOSIS — D6862 Lupus anticoagulant syndrome: Secondary | ICD-10-CM | POA: Diagnosis present

## 2020-07-15 DIAGNOSIS — G4700 Insomnia, unspecified: Secondary | ICD-10-CM | POA: Diagnosis present

## 2020-07-15 DIAGNOSIS — Z794 Long term (current) use of insulin: Secondary | ICD-10-CM

## 2020-07-15 DIAGNOSIS — J45909 Unspecified asthma, uncomplicated: Secondary | ICD-10-CM | POA: Diagnosis present

## 2020-07-15 DIAGNOSIS — E119 Type 2 diabetes mellitus without complications: Secondary | ICD-10-CM | POA: Diagnosis present

## 2020-07-15 DIAGNOSIS — Z880 Allergy status to penicillin: Secondary | ICD-10-CM | POA: Diagnosis not present

## 2020-07-15 DIAGNOSIS — Z79899 Other long term (current) drug therapy: Secondary | ICD-10-CM

## 2020-07-15 DIAGNOSIS — Z86711 Personal history of pulmonary embolism: Secondary | ICD-10-CM | POA: Diagnosis present

## 2020-07-15 DIAGNOSIS — Z9151 Personal history of suicidal behavior: Secondary | ICD-10-CM

## 2020-07-15 DIAGNOSIS — Z7901 Long term (current) use of anticoagulants: Secondary | ICD-10-CM | POA: Diagnosis not present

## 2020-07-15 LAB — CBG MONITORING, ED
Glucose-Capillary: 121 mg/dL — ABNORMAL HIGH (ref 70–99)
Glucose-Capillary: 125 mg/dL — ABNORMAL HIGH (ref 70–99)
Glucose-Capillary: 117 mg/dL — ABNORMAL HIGH (ref 70–99)
Glucose-Capillary: 195 mg/dL — ABNORMAL HIGH (ref 70–99)

## 2020-07-15 LAB — GLUCOSE, CAPILLARY: Glucose-Capillary: 131 mg/dL — ABNORMAL HIGH (ref 70–99)

## 2020-07-15 LAB — SARS CORONAVIRUS 2 (TAT 6-24 HRS): SARS Coronavirus 2: NEGATIVE

## 2020-07-15 MED ORDER — INSULIN ASPART 100 UNIT/ML ~~LOC~~ SOLN
0.0000 [IU] | SUBCUTANEOUS | Status: DC
  Administered 2020-07-15 – 2020-07-18 (×5): 2 [IU] via SUBCUTANEOUS
  Filled 2020-07-15 (×5): qty 1

## 2020-07-15 MED ORDER — SIMVASTATIN 40 MG PO TABS
40.0000 mg | ORAL_TABLET | Freq: Every day | ORAL | Status: DC
  Administered 2020-07-16 – 2020-07-21 (×6): 40 mg via ORAL
  Filled 2020-07-15 (×6): qty 1

## 2020-07-15 MED ORDER — LITHIUM CARBONATE ER 450 MG PO TBCR
450.0000 mg | EXTENDED_RELEASE_TABLET | Freq: Two times a day (BID) | ORAL | Status: DC
  Administered 2020-07-15 – 2020-07-22 (×14): 450 mg via ORAL
  Filled 2020-07-15 (×15): qty 1

## 2020-07-15 MED ORDER — ACETAMINOPHEN 325 MG PO TABS: 650.0000 mg | ORAL_TABLET | Freq: Four times a day (QID) | ORAL | Status: DC | PRN

## 2020-07-15 MED ORDER — CLOZAPINE 100 MG PO TABS
500.0000 mg | ORAL_TABLET | Freq: Every day | ORAL | Status: DC
  Administered 2020-07-15 – 2020-07-21 (×7): 500 mg via ORAL
  Filled 2020-07-15 (×7): qty 5

## 2020-07-15 MED ORDER — OLANZAPINE 10 MG PO TABS
10.0000 mg | ORAL_TABLET | Freq: Two times a day (BID) | ORAL | Status: DC
  Administered 2020-07-16 – 2020-07-22 (×13): 10 mg via ORAL
  Filled 2020-07-15 (×13): qty 1

## 2020-07-15 MED ORDER — INFLUENZA VAC SPLIT QUAD 0.5 ML IM SUSY
0.5000 mL | PREFILLED_SYRINGE | INTRAMUSCULAR | Status: AC
  Administered 2020-07-17: 0.5 mL via INTRAMUSCULAR
  Filled 2020-07-15 (×3): qty 0.5

## 2020-07-15 MED ORDER — MAGNESIUM HYDROXIDE 400 MG/5ML PO SUSP: 30.0000 mL | Freq: Every day | ORAL | Status: DC | PRN

## 2020-07-15 MED ORDER — LISINOPRIL 2.5 MG PO TABS
2.5000 mg | ORAL_TABLET | Freq: Every day | ORAL | Status: DC
  Administered 2020-07-16 – 2020-07-22 (×7): 2.5 mg via ORAL
  Filled 2020-07-15 (×7): qty 1

## 2020-07-15 MED ORDER — TRAZODONE HCL 100 MG PO TABS
100.0000 mg | ORAL_TABLET | Freq: Every day | ORAL | Status: DC
  Administered 2020-07-15 – 2020-07-21 (×7): 100 mg via ORAL
  Filled 2020-07-15 (×7): qty 1

## 2020-07-15 MED ORDER — HYDROXYZINE HCL 50 MG PO TABS
50.0000 mg | ORAL_TABLET | Freq: Three times a day (TID) | ORAL | Status: DC | PRN
  Administered 2020-07-16 – 2020-07-20 (×3): 50 mg via ORAL
  Filled 2020-07-15 (×3): qty 1

## 2020-07-15 MED ORDER — METFORMIN HCL 500 MG PO TABS
1000.0000 mg | ORAL_TABLET | Freq: Two times a day (BID) | ORAL | Status: DC
  Administered 2020-07-16 – 2020-07-22 (×13): 1000 mg via ORAL
  Filled 2020-07-15 (×13): qty 2

## 2020-07-15 MED ORDER — APIXABAN 5 MG PO TABS
5.0000 mg | ORAL_TABLET | Freq: Two times a day (BID) | ORAL | Status: DC
  Administered 2020-07-16 – 2020-07-22 (×13): 5 mg via ORAL
  Filled 2020-07-15 (×15): qty 1

## 2020-07-15 MED ORDER — ALUM & MAG HYDROXIDE-SIMETH 200-200-20 MG/5ML PO SUSP: 30.0000 mL | ORAL | Status: DC | PRN

## 2020-07-15 MED ORDER — SERTRALINE HCL 25 MG PO TABS
50.0000 mg | ORAL_TABLET | Freq: Every day | ORAL | Status: DC
  Administered 2020-07-16 – 2020-07-21 (×6): 50 mg via ORAL
  Filled 2020-07-15 (×6): qty 2

## 2020-07-15 MED ORDER — TRAZODONE HCL 100 MG PO TABS
100.0000 mg | ORAL_TABLET | Freq: Every evening | ORAL | Status: DC | PRN
  Administered 2020-07-17 – 2020-07-21 (×2): 100 mg via ORAL
  Filled 2020-07-15: qty 1

## 2020-07-15 NOTE — BH Assessment (Signed)
Per Dr. Clapacs pt meets criteria for INPT and is currently under review with [facility]  

## 2020-07-15 NOTE — ED Provider Notes (Signed)
Emergency Medicine Observation Re-evaluation Note  Lance [name] is a 45 y.o. male, seen on rounds today.  Pt initially presented to the ED for complaints of Mental Health Problem Currently, the patient is resting, voices no medical complaints.  Physical Exam  BP 117/89   Pulse 78   Temp 97.9 F (36.6 C) (Oral)   Resp 18   Ht 5\' 10"  (1.778 m)   Wt 85.3 kg   SpO2 100%   BMI 26.98 kg/m  Physical Exam General: Resting in no acute distress Cardiac: No cyanosis Lungs: Equal rise and fall Psych: Not agitated  ED Course / MDM  EKG:    I have reviewed the labs performed to date as well as medications administered while in observation.  Recent changes in the last 24 hours include no events overnight.  Plan  Current plan is for psychiatric disposition. Patient is not under full IVC at this time.   , MD 07/15/20 [phone]

## 2020-07-15 NOTE — ED Notes (Signed)
Patient sleeping, vital signs deferred. 

## 2020-07-15 NOTE — Tx Team (Signed)
Initial Treatment Plan 07/15/2020 5:52 PM [name] EYC:[id number]    PATIENT STRESSORS: Financial difficulties Medication change or noncompliance   PATIENT STRENGTHS: Average or above average intelligence Communication skills Motivation for treatment/growth Physical Health   PATIENT IDENTIFIED PROBLEMS: Lack of sleep  Depression  Suicidal thoughts                 DISCHARGE CRITERIA:  Ability to meet basic life and health needs Adequate post-discharge living arrangements Medical problems require only outpatient monitoring Verbal commitment to aftercare and medication compliance  PRELIMINARY DISCHARGE PLAN: Attend aftercare/continuing care group Return to previous living arrangement  PATIENT/FAMILY INVOLVEMENT: This treatment plan has been presented to and reviewed with the patient, Lance [name], and/or family member.  The patient and family have been given the opportunity to ask questions and make suggestions.  Leonarda Salon, RN 07/15/2020, 5:52 PM

## 2020-07-15 NOTE — Progress Notes (Signed)
Patient ID: Lance [name], male   DOB: 1975/06/21, 45 y.o.   MRN: [id number] Patient presents involuntarily secondary to increased depression, anxiety and thoughts of self-harm. Patient is a resident at a local group home and has been exhibiting symptoms of depression. Patient reports that he has not been sleeping well.Reports that he was not receiving medications regularly, that  this might have contributed to his depression and hopelessness. Patient presents with history of schizoaffective disorder, usually managed by Clozapine. He has been hospitalized multiple times. He continues to report that "something isn't right with me...". Patient denies history of abuse or neglect. Did not want to discuss family history but mentioned that he has aunties and uncles who are supportive. He presents with no acute medical issues. Skin assessment reveals no concerns. Patient was cooperative with assessment. Was admitted and oriented to the unit and safety precautions initiated.

## 2020-07-15 NOTE — ED Notes (Signed)
Meal tray given 

## 2020-07-15 NOTE — ED Notes (Signed)
Pt's watch removed and placed with pt belongings.

## 2020-07-15 NOTE — Consult Note (Signed)
[facility] Face-to-Face Psychiatry Consult   Reason for Consult: Consult for 45 year old man with a history of schizophrenia or schizoaffective disorder came to the emergency room reporting suicidal thoughts Referring Physician: Su Hoff Patient Identification: Lance [name] MRN:  [id number] Principal Diagnosis: Schizoaffective disorder, bipolar type (HCC) Diagnosis:  Principal Problem:   Schizoaffective disorder, bipolar type (HCC) Active Problems:   Hypertension   Diabetes mellitus without complication (HCC)   Hyperlipidemia   History of pulmonary embolism   Chronic anticoagulation   Total Time spent with patient: 1 hour  Subjective:   Lance [name] is a 45 y.o. male patient admitted with "it has been a lot going on".  HPI: Patient seen chart reviewed.  Patient known from previous encounters.  This 45 year old man who has schizoaffective disorder and multiple medical problems came from his group home reporting that he had suicidal thoughts.  He said that his mood is been feeling depressed and bad recently.  He mentions several times feeling like "there is a lot going on" but exactly what that is it is hard to pin down.  It does sound like he had been off of his clozapine and possibly other medicines for a couple of days prior to coming into the hospital.  Patient endorses auditory hallucinations depressed mood poor sleep suicidal thought with mention of cutting his wrists.  Denies recent alcohol or drug use.  Denies being noncompliant with medicine although he does say that because of problems with obtaining medicine he was off of his pills for a couple days.  Will not discuss any other specific stressor.  Past Psychiatric History: Long history of schizoaffective disorder multiple hospitalizations.  History of past substance abuse now under control with long-term residence in a group home.  History of suicide attempts in the past.  Patient does well on current mixture of clozapine and lithium  and other medicines  Risk to Self:   Risk to Others:   Prior Inpatient Therapy:   Prior Outpatient Therapy:    Past Medical History:  Past Medical History:  Diagnosis Date  . Depression   . Diabetes mellitus without complication (HCC)   . Hyperlipidemia   . Hypertension   . Lupus anticoagulant disorder (HCC) 06/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  . PE (pulmonary thromboembolism) (HCC)   . Schizo affective schizophrenia (HCC)   . Supratherapeutic INR 11/19/2016   History reviewed. No pertinent surgical history. Family History:  Family History  Problem Relation Age of Onset  . CAD Mother   . CAD Sister    Family Psychiatric  History: Positive for mental health problems especially mood problems in the family Social History:  Social History   Substance and Sexual Activity  Alcohol Use No   Comment: occassionally     Social History   Substance and Sexual Activity  Drug Use Not Currently  . Types: Marijuana   Comment: Last use 11/29/16    Social History   Socioeconomic History  . Marital status: Single    Spouse name: Not on file  . Number of children: Not on file  . Years of education: Not on file  . Highest education level: Not on file  Occupational History  . Not on file  Tobacco Use  . Smoking status: Current Every Day Smoker    Packs/day: 1.00    Years: 23.00    Pack years: 23.00    Types: Cigarettes  . Smokeless tobacco: Never Used  . Tobacco comment: will provide material  Substance and Sexual Activity  . Alcohol use: No    Comment: occassionally  . Drug use: Not Currently    Types: Marijuana    Comment: Last use 11/29/16  . Sexual activity: Never    Birth control/protection: None    Comment: occasional marijuana- none recently  Other Topics Concern  . Not on file  Social History Narrative   From a group home in [place]   Social Determinants of Health   Financial Resource Strain: Not on file  Food Insecurity: Not on file   Transportation Needs: Not on file  Physical Activity: Not on file  Stress: Not on file  Social Connections: Not on file   Additional Social History:    Allergies:   Allergies  Allergen Reactions  . Penicillins Other (See Comments)    Reaction: "lockjaw" Has patient had a PCN reaction causing immediate rash, facial/tongue/throat swelling, SOB or lightheadedness with hypotension: No Has patient had a PCN reaction causing severe rash involving mucus membranes or skin necrosis: No Has patient had a PCN reaction that required hospitalization: No Has patient had a PCN reaction occurring within the last 10 years: No If all of the above answers are "NO", then may proceed with Cephalosporin use.     Labs:  Results for orders placed or performed during the hospital encounter of 07/14/20 (from the past 48 hour(s))  Comprehensive metabolic panel     Status: Abnormal   Collection Time: 07/14/20 11:49 AM  Result Value Ref Range   Sodium 143 135 - 145 mmol/L   Potassium 4.2 3.5 - 5.1 mmol/L   Chloride 108 98 - 111 mmol/L   CO2 22 22 - 32 mmol/L   Glucose, Bld 105 (H) 70 - 99 mg/dL    Comment: Glucose reference range applies only to samples taken after fasting for at least 8 hours.   BUN 14 6 - 20 mg/dL   Creatinine, Ser 8.01 0.61 - 1.24 mg/dL   Calcium 9.7 8.9 - 65.5 mg/dL   Total Protein 7.5 6.5 - 8.1 g/dL   Albumin 4.9 3.5 - 5.0 g/dL   AST 23 15 - 41 U/L   ALT 21 0 - 44 U/L   Alkaline Phosphatase 65 38 - 126 U/L   Total Bilirubin 0.9 0.3 - 1.2 mg/dL   GFR, Estimated >37 >48 mL/min    Comment: (NOTE) Calculated using the CKD-EPI Creatinine Equation (2021)    Anion gap 13 5 - 15    Comment: Performed at [facility], [address] [place], [place] [address]  Ethanol     Status: None   Collection Time: 07/14/20 11:49 AM  Result Value Ref Range   Alcohol, Ethyl (B) <10 <10 mg/dL    Comment: (NOTE) Lowest detectable limit for serum alcohol is 10 mg/dL.  For medical  purposes only. Performed at [facility], [address] [place], [place] [address]   cbc     Status: None   Collection Time: 07/14/20 11:49 AM  Result Value Ref Range   WBC 5.8 4.0 - 10.5 K/uL   RBC 4.76 4.22 - 5.81 MIL/uL   Hemoglobin 15.2 13.0 - 17.0 g/dL   HCT 92.0 10.0 - 71.2 %   MCV 92.2 80.0 - 100.0 fL   MCH 31.9 26.0 - 34.0 pg   MCHC 34.6 30.0 - 36.0 g/dL   RDW 19.7 58.8 - 32.5 %   Platelets 227 150 - 400 K/uL   nRBC 0.0 0.0 - 0.2 %  Comment: Performed at [facility] [place], [address] [place], [place] [address]  Lithium level     Status: None   Collection Time: 07/14/20 11:49 AM  Result Value Ref Range   Lithium Lvl 0.86 0.60 - 1.20 mmol/L    Comment: Performed at [facility], [address] [place], [place] [address]  Hemoglobin A1c     Status: None   Collection Time: 07/14/20 11:49 AM  Result Value Ref Range   Hgb A1c MFr Bld 5.3 4.8 - 5.6 %    Comment: (NOTE) Pre diabetes:          5.7%-6.4%  Diabetes:              >6.4%  Glycemic control for   <7.0% adults with diabetes    Mean Plasma Glucose 105.41 mg/dL    Comment: Performed at [facility], [address] [place], [place] [address]  Lipase, blood     Status: None   Collection Time: 07/14/20 11:49 AM  Result Value Ref Range   Lipase 30 11 - 51 U/L    Comment: Performed at [facility], [address] [place], [place] [address]  CBC with Differential/Platelet     Status: None   Collection Time: 07/14/20 11:49 AM  Result Value Ref Range   WBC 5.7 4.0 - 10.5 K/uL   RBC 4.82 4.22 - 5.81 MIL/uL   Hemoglobin 15.6 13.0 - 17.0 g/dL   HCT 08.6 57.8 - 46.9 %   MCV 92.3 80.0 - 100.0 fL   MCH 32.4 26.0 - 34.0 pg   MCHC 35.1 30.0 - 36.0 g/dL   RDW 62.9 52.8 - 41.3 %   Platelets 239 150 - 400 K/uL   nRBC 0.0 0.0 - 0.2 %   Neutrophils Relative % 66 %   Neutro Abs 3.8 1.7 - 7.7 K/uL   Lymphocytes Relative 22 %   Lymphs Abs 1.2 0.7 - 4.0 K/uL   Monocytes  Relative 12 %   Monocytes Absolute 0.7 0.1 - 1.0 K/uL   Eosinophils Relative 0 %   Eosinophils Absolute 0.0 0.0 - 0.5 K/uL   Basophils Relative 0 %   Basophils Absolute 0.0 0.0 - 0.1 K/uL   Immature Granulocytes 0 %   Abs Immature Granulocytes 0.01 0.00 - 0.07 K/uL    Comment: Performed at [facility], [name] [place], [place] [address]  SARS CORONAVIRUS 2 (TAT 6-24 HRS) Nasopharyngeal Nasopharyngeal Swab     Status: None   Collection Time: 07/14/20  1:08 PM   Specimen: Nasopharyngeal Swab  Result Value Ref Range   SARS Coronavirus 2 NEGATIVE NEGATIVE    Comment: (NOTE) SARS-CoV-2 target nucleic acids are NOT DETECTED.  The SARS-CoV-2 RNA is generally detectable in upper and lower respiratory specimens during the acute phase of infection. Negative results do not preclude SARS-CoV-2 infection, do not rule out co-infections with other pathogens, and should not be used as the sole basis for treatment or other patient management decisions. Negative results must be combined with clinical observations, patient history, and epidemiological information. The expected result is Negative.  Fact Sheet for Patients: HairSlick.no  Fact Sheet for Healthcare Providers: quierodirigir.com  This test is not yet approved or cleared by the [place] FDA and  has been authorized for detection and/or diagnosis of SARS-CoV-2 by FDA under an Emergency Use Authorization (EUA). This EUA will remain  in effect (meaning this test can be used) for the duration of the  COVID-19 declaration under Se ction 564(b)(1) of the Act, 21 U.S.C. section 360bbb-3(b)(1), unless the authorization is terminated or revoked sooner.  Performed at [facility], [address], [place], [place] [address]   CBG monitoring, ED     Status: None   Collection Time: 07/14/20  3:55 PM  Result Value Ref Range   Glucose-Capillary 92 70 - 99 mg/dL     Comment: Glucose reference range applies only to samples taken after fasting for at least 8 hours.  CBG monitoring, ED     Status: None   Collection Time: 07/14/20  7:34 PM  Result Value Ref Range   Glucose-Capillary 87 70 - 99 mg/dL    Comment: Glucose reference range applies only to samples taken after fasting for at least 8 hours.  CBG monitoring, ED     Status: Abnormal   Collection Time: 07/14/20 11:39 PM  Result Value Ref Range   Glucose-Capillary 197 (H) 70 - 99 mg/dL    Comment: Glucose reference range applies only to samples taken after fasting for at least 8 hours.  CBG monitoring, ED     Status: Abnormal   Collection Time: 07/15/20  4:10 AM  Result Value Ref Range   Glucose-Capillary 121 (H) 70 - 99 mg/dL    Comment: Glucose reference range applies only to samples taken after fasting for at least 8 hours.  CBG monitoring, ED     Status: Abnormal   Collection Time: 07/15/20  7:53 AM  Result Value Ref Range   Glucose-Capillary 125 (H) 70 - 99 mg/dL    Comment: Glucose reference range applies only to samples taken after fasting for at least 8 hours.   Comment 1 Notify RN   CBG monitoring, ED     Status: Abnormal   Collection Time: 07/15/20 11:54 AM  Result Value Ref Range   Glucose-Capillary 117 (H) 70 - 99 mg/dL    Comment: Glucose reference range applies only to samples taken after fasting for at least 8 hours.    Current Facility-Administered Medications  Medication Dose Route Frequency Provider Last Rate Last Admin  . apixaban (ELIQUIS) tablet 5 mg  5 mg Oral BID Gilles Chiquito, MD   5 mg at 07/15/20 0855  . cloZAPine (CLOZARIL) tablet 500 mg  500 mg Oral QHS Gilles Chiquito, MD   500 mg at 07/14/20 2215  . insulin aspart (novoLOG) injection 0-15 Units  0-15 Units Subcutaneous Q4H Gilles Chiquito, MD   2 Units at 07/15/20 [id number]  . lisinopril (ZESTRIL) tablet 2.5 mg  2.5 mg Oral Daily Gilles Chiquito, MD   2.5 mg at 07/15/20 0855  . lithium carbonate (ESKALITH) CR  tablet 450 mg  450 mg Oral Q12H Gilles Chiquito, MD   450 mg at 07/15/20 0859  . metFORMIN (GLUCOPHAGE) tablet 1,000 mg  1,000 mg Oral BID WC Gilles Chiquito, MD   1,000 mg at 07/15/20 0854  . OLANZapine (ZYPREXA) tablet 10 mg  10 mg Oral BID Irean Hong, MD   10 mg at 07/15/20 0853  . sertraline (ZOLOFT) tablet 50 mg  50 mg Oral Daily Irean Hong, MD      . simvastatin (ZOCOR) tablet 40 mg  40 mg Oral q1800 Gilles Chiquito, MD   40 mg at 07/14/20 1733  . traZODone (DESYREL) tablet 100 mg  100 mg Oral QHS Gilles Chiquito, MD   100 mg at 07/14/20 2215   Current Outpatient Medications  Medication Sig Dispense Refill  . apixaban (ELIQUIS) 5 MG TABS tablet Take 1 tablet (5 mg total) by mouth 2 (two) times daily. 60 tablet 6  . cloZAPine (CLOZARIL) 100 MG tablet Take 5 tablets (500 mg total) by mouth at bedtime. 150 tablet 1  . lisinopril (ZESTRIL) 2.5 MG tablet Take 1 tablet (2.5 mg total) by mouth daily. 30 tablet 1  . lithium carbonate (ESKALITH) 450 MG CR tablet Take 1 tablet (450 mg total) by mouth every 12 (twelve) hours. 60 tablet 1  . metFORMIN (GLUCOPHAGE) 1000 MG tablet Take 1 tablet (1,000 mg total) by mouth 2 (two) times daily with a meal. 60 tablet 1  . sertraline (ZOLOFT) 50 MG tablet Take 1 tablet (50 mg total) by mouth daily. 30 tablet 1  . simvastatin (ZOCOR) 40 MG tablet Take 1 tablet (40 mg total) by mouth daily at 6 PM. 30 tablet 6  . traZODone (DESYREL) 100 MG tablet Take 1 tablet (100 mg total) by mouth at bedtime. 30 tablet 1  . albuterol (VENTOLIN HFA) 108 (90 Base) MCG/ACT inhaler Inhale 1-2 puffs into the lungs every 6 (six) hours as needed for wheezing or shortness of breath. 18 g 1  . ammonium lactate (AMLACTIN) 12 % cream APPY TOPICALLY AS NEEDED FOR DRY SKIN 385 g 10  . fluticasone furoate-vilanterol (BREO ELLIPTA) 200-25 MCG/INH AEPB Inhale 1 puff into the lungs daily. 30 each 6  . glucose blood (FORA V30A BLOOD GLUCOSE TEST) test strip Use as instructed 100 each  12  . LEVEMIR FLEXTOUCH 100 UNIT/ML FlexPen Inject into the skin.    Marland Kitchen. NOVOFINE AUTOCOVER 30G X 8 MM MISC       Musculoskeletal: Strength & Muscle Tone: within normal limits Gait & Station: normal Patient leans: N/A  Psychiatric Specialty Exam: Physical Exam Vitals and nursing note reviewed.  Constitutional:      Appearance: He is well-developed and well-nourished.  HENT:     Head: Normocephalic and atraumatic.  Eyes:     Conjunctiva/sclera: Conjunctivae normal.     Pupils: Pupils are equal, round, and reactive to light.  Cardiovascular:     Heart sounds: Normal heart sounds.  Pulmonary:     Effort: Pulmonary effort is normal.  Abdominal:     Palpations: Abdomen is soft.  Musculoskeletal:        General: Normal range of motion.     Cervical back: Normal range of motion.  Skin:    General: Skin is warm and dry.  Neurological:     General: No focal deficit present.     Mental Status: He is alert.  Psychiatric:        Attention and Perception: He is inattentive. He perceives auditory hallucinations.        Mood and Affect: Mood is anxious and depressed.        Speech: Speech is delayed.        Behavior: Behavior is slowed.        Thought Content: Thought content is paranoid. Thought content includes suicidal ideation. Thought content includes suicidal plan.        Cognition and Memory: Cognition is impaired.        Judgment: Judgment is impulsive.     Review of Systems  Constitutional: Negative.   HENT: Negative.   Eyes: Negative.   Respiratory: Negative.   Cardiovascular: Negative.   Gastrointestinal: Negative.   Musculoskeletal: Negative.   Skin: Negative.   Neurological: Negative.   Psychiatric/Behavioral: Positive for decreased concentration,  hallucinations, sleep disturbance and suicidal ideas.    Blood pressure 129/84, pulse 88, temperature 98 F (36.7 C), temperature source Oral, resp. rate 20, height 5\' 10"  (1.778 m), weight 85.3 kg, SpO2 100 %.Body mass  index is 26.98 kg/m.  General Appearance: Casual  Eye Contact:  Fair  Speech:  Slow  Volume:  Decreased  Mood:  Depressed  Affect:  Constricted  Thought Process:  Coherent  Orientation:  Full (Time, Place, and Person)  Thought Content:  Logical and Hallucinations: Auditory  Suicidal Thoughts:  Yes.  with intent/plan  Homicidal Thoughts:  No  Memory:  Immediate;   Fair Recent;   Fair Remote;   Fair  Judgement:  Impaired  Insight:  Shallow  Psychomotor Activity:  Decreased  Concentration:  Concentration: Poor  Recall:  Poor  Fund of Knowledge:  Poor  Language:  Poor  Akathisia:  No  Handed:  Right  AIMS (if indicated):     Assets:  Desire for Improvement Housing Social Support  ADL's:  Impaired  Cognition:  Impaired,  Mild  Sleep:        Treatment Plan Summary: Medication management and Plan Patient with a history of suicide attempts in the past and chronic mental health issues endorsing suicidal ideation depression and multiple symptoms of depression along with hallucinations.  Patient will be admitted to the psychiatric unit.  Continue current medications as prescribed.  Labs will be reviewed.  His absolute neutrophil count is fine and in the normal range.  Disposition: Recommend psychiatric Inpatient admission when medically cleared.  , MD 07/15/2020 1:59 PM

## 2020-07-15 NOTE — Progress Notes (Signed)
Patient is quiet and reserved. He has been active on the unit and is starting to build rapport with other peers. Although he had previously endorsed SI he denied SI  HI  AVH anxiety and pain at this encounter.  He does endorse depression rating it 7/10. Patient is med compliant and tolerated meds without incident. He has contracted for safety and remains safe with 15 minute safety checks and was encouraged to come to staff with any concerns.     Cleo Butler-Nicholson,LPN

## 2020-07-15 NOTE — Plan of Care (Signed)
  Problem: Coping: Goal: Ability to verbalize frustrations and anger appropriately will improve Outcome: Progressing Goal: Ability to demonstrate self-control will improve Outcome: Progressing   Problem: Safety: Goal: Periods of time without injury will increase Outcome: Progressing   Problem: Education: Goal: Utilization of techniques to improve thought processes will improve Outcome: Progressing Goal: Knowledge of the prescribed therapeutic regimen will improve Outcome: Progressing   Problem: Health Behavior/Discharge Planning: Goal: Ability to make decisions will improve Outcome: Progressing Goal: Compliance with therapeutic regimen will improve Outcome: Progressing   Problem: Role Relationship: Goal: Will demonstrate positive changes in social behaviors and relationships Outcome: Progressing   Problem: Self-Concept: Goal: Level of anxiety will decrease Outcome: Progressing

## 2020-07-15 NOTE — BH Assessment (Signed)
Comprehensive Clinical Assessment (CCA) Screening, Triage and Referral Note  07/15/2020 Lance [name] [id number]   Lance [name] is a 45 y.o male who presents to [facility] ED voluntarily for treatment. Per triage note, Pt presents to the ER stating he is suicidal. Pt states plan was to hand himself with a belt. Pt does not currently have a belt. Pt states he has had these thoughts for the last month. Pt states "I am very depressed." Pt states "I am hearing voices".  During TTS assessment pt presents calm, sad, depressed, and oriented x 5, restless but cooperative, and mood-congruent with affect. The pt does not appear to be responding to internal or external stimuli. Neither is the pt presenting with any delusional thinking. Pt was able to verify the information provided to triage RN. Pt identified his main compliant to be the need for medications, increased SI and anxiety. Pt reports noncompliance with medications for the last 3-4 days due to running out and to currently endorse thoughts of wanting to slit his wrist, frequent panic attacks, nightmares, and lack of sleep for the last week. Pt reports a hx of SI and one attempt by OD when he was a teenager. Pt reports an MH hx of schizophrenia, anxiety, and depression. Pt was unable to identify any triggers stating, "it's all being manifesting for a while now and other stuff I don't care to talk about". Pt denies an SA hx stating, "I only smoke cigarettes" and reports an INPT hx with [facility], [address], [address], [place] located in [place]. Pt denies an OPT hx and reports a family hx of MH/SA but is currently unable to provide further information.  Pt expressed the need to resume his medications, rest and "get my mind right". Pt denies any current HI/AH/VH but continues to endorse SI with a plan placing him at risk for safety concerns. Pt is also currently receptive to INPT.   Contact made with pt's legal guardian Lance [name] [phone])- TTS attempted to  make contact and was only able to leave a HIPAA compliant voicemail.   Per Dr. Toni Amend pt meet criteria for INPT.   Chief Complaint:  Chief Complaint  Patient presents with  . Mental Health Problem  . Schizophrenia  . Suicidal   Visit Diagnosis: Schizoaffective disorder, Depressive type  Patient Reported Information How did you hear about [place]? Self   Referral name: No data recorded  Referral phone number: No data recorded Whom do you see for routine medical problems? Primary Care   Practice/Facility Name: Internal Medicine- [facility]   Practice/Facility Phone Number: No data recorded  Name of Contact: Dr. Sheppard Plumber Number: No data recorded  Contact Fax Number: No data recorded  Prescriber Name: No data recorded  Prescriber Address (if known): No data recorded What Is the Reason for Your Visit/Call Today? No data recorded How Long Has This Been Causing You Problems? <Week  Have You Recently Been in Any Inpatient Treatment (Hospital/Detox/Crisis Center/28-Day Program)? No   Name/Location of Program/Hospital:No data recorded  How Long Were You There? No data recorded  When Were You Discharged? No data recorded Have You Ever Received Services From [facility] Before? Yes   Who Do You See at [facility]? ED, Internal Medicine  Have You Recently Had Any Thoughts About Hurting Yourself? Yes   Are You Planning to Commit Suicide/Harm Yourself At This time?  No  Have you Recently Had Thoughts About Hurting Someone Karolee Ohs? No   Explanation: No data recorded Have  You Used Any Alcohol or Drugs in the Past 24 Hours? No   How Long Ago Did You Use Drugs or Alcohol?  No data recorded  What Did You Use and How Much? No data recorded What Do You Feel Would Help You the Most Today? Medication; Therapy  Do You Currently Have a Therapist/Psychiatrist? No   Name of Therapist/Psychiatrist: No data recorded  Have You Been Recently Discharged From Any Office Practice or Programs? No  (last admission 6 months ago)   Explanation of Discharge From Practice/Program:  No data recorded    CCA Screening Triage Referral Assessment Type of Contact: Face-to-Face   Is this Initial or Reassessment? No data recorded  Date Telepsych consult ordered in [facility]:  No data recorded  Time Telepsych consult ordered in [facility]:  No data recorded Patient Reported Information Reviewed? Yes   Patient Left Without Being Seen? No data recorded  Reason for Not Completing Assessment: No data recorded Collateral Involvement: Lance [name] [phone]  Does Patient Have a Court Appointed Legal Guardian? No data recorded  Name and Contact of Legal Guardian:  Lance [name] [phone]  If Minor and Not Living with Parent(s), Who has Custody? n/a  Is CPS involved or ever been involved? Never  Is APS involved or ever been involved? Never  Patient Determined To Be At Risk for Harm To Self or Others Based on Review of Patient Reported Information or Presenting Complaint? No   Method: No data recorded  Availability of Means: No data recorded  Intent: No data recorded  Notification Required: No data recorded  Additional Information for Danger to Others Potential:  No data recorded  Additional Comments for Danger to Others Potential:  No data recorded  Are There Guns or Other Weapons in Your Home?  No    Types of Guns/Weapons: No data recorded   Are These Weapons Safely Secured?                              No data recorded   Who Could Verify You Are Able To Have These Secured:    No data recorded Do You Have any Outstanding Charges, Pending Court Dates, Parole/Probation? No data recorded Contacted To Inform of Risk of Harm To Self or Others: No data recorded Location of Assessment: [facility] ED  Does Patient Present under Involuntary Commitment? No   IVC Papers Initial File Date: No data recorded  [place] of Residence:   Patient Currently Receiving the Following Services: Not Receiving  Services   Determination of Need: Emergent (2 hours)   Options For Referral: Inpatient Hospitalization   Opal Sidles, LCSWA

## 2020-07-15 NOTE — Progress Notes (Signed)
Patient is calm and cooperative. Alert and oriented.  CBG 131 this evening 2 units of insulin provided and tolerated without incident.    Cleo Butler-Nicholson, LPN

## 2020-07-15 NOTE — ED Notes (Signed)
Pt discharged under IVC to BMU.  All belongings will be sent with patient.  Pt calm and cooperative.

## 2020-07-15 NOTE — Progress Notes (Signed)
Patient admitted from ED with schizoaffective disorder bipolar type.Patient stated that he was depressed and plan to cut his wrist.Patient is from a group home.Patient was off medications for few days.Patient stated that his main stress is not able to sleep good.Patient pleasant and cooperative during admission assessment. Patient denies SI/HI at this time. Patient denies AVH. Patient informed of fall risk status, fall risk assessed "low" at this time. Patient oriented to unit/staff/room. Patient denies any questions/concerns at this time. Patient safe on unit with Q15 minute checks for safety. Skin assessment and body search done.No contraband found.

## 2020-07-15 NOTE — BH Assessment (Signed)
Patient's ED Nurse Dawn contacted TTS to inform that patient had been seen by [facility] and is recommended for Inpatient. TTS consult was placed at 1:11am, writer attempted to assess patient at 1:20am but patient had received his night medications, writer was unable to arouse patient at this time.

## 2020-07-15 NOTE — BH Assessment (Signed)
Patient can come down NOW  Call to give report: [phone]  Patient is to be admitted to [facility] by Dr. Neale Burly  Attending Physician will be. Dr. Neale Burly  Patient has been assigned to room 323, by [facility] Charge Nurse Maryelizabeth Kaufmann, RN.   Intake Paper Work has been signed and placed on patient chart.  ER staff is aware of the admission: 1. Carlisle Beers, ER Secretary  2. Larinda Buttery, ER MD  3. Amy B., Patient's Nurse  4. Ethelene Browns, Patient Access.   TTS was contacted back by pt's guardian [name]) who was provided an update on pt's current disposition and agreed. Maureen Ralphs provided verbal consent for pt to undergo INPT with [facility] and reports no current concerns.

## 2020-07-16 DIAGNOSIS — F250 Schizoaffective disorder, bipolar type: Secondary | ICD-10-CM | POA: Diagnosis not present

## 2020-07-16 LAB — LIPID PANEL
Cholesterol: 135 mg/dL (ref 0–200)
HDL: 37 mg/dL — ABNORMAL LOW (ref >40)
LDL Cholesterol: 53 mg/dL (ref 0–99)
Total CHOL/HDL Ratio: 3.6 RATIO
Triglycerides: 226 mg/dL — ABNORMAL HIGH (ref <150)
VLDL: 45 mg/dL — ABNORMAL HIGH (ref 0–40)

## 2020-07-16 LAB — GLUCOSE, CAPILLARY
Glucose-Capillary: 121 mg/dL — ABNORMAL HIGH (ref 70–99)
Glucose-Capillary: 103 mg/dL — ABNORMAL HIGH (ref 70–99)
Glucose-Capillary: 141 mg/dL — ABNORMAL HIGH (ref 70–99)
Glucose-Capillary: 102 mg/dL — ABNORMAL HIGH (ref 70–99)

## 2020-07-16 LAB — HEMOGLOBIN A1C
Hgb A1c MFr Bld: 5.3 % (ref 4.8–5.6)
Mean Plasma Glucose: 105.41 mg/dL

## 2020-07-16 MED ORDER — FLUTICASONE FUROATE-VILANTEROL 200-25 MCG/INH IN AEPB
1.0000 | INHALATION_SPRAY | Freq: Every day | RESPIRATORY_TRACT | Status: DC
  Administered 2020-07-16 – 2020-07-22 (×7): 1 via RESPIRATORY_TRACT
  Filled 2020-07-16: qty 28

## 2020-07-16 MED ORDER — ALBUTEROL SULFATE HFA 108 (90 BASE) MCG/ACT IN AERS
1.0000 | INHALATION_SPRAY | Freq: Four times a day (QID) | RESPIRATORY_TRACT | Status: DC | PRN
  Filled 2020-07-16 (×4): qty 6.7

## 2020-07-16 NOTE — Progress Notes (Signed)
Patient CBG was 121. Tolerated without incident.     Cleo Butler-Nicholson, LPN

## 2020-07-16 NOTE — BHH Suicide Risk Assessment (Signed)
[facility] Admission Suicide Risk Assessment   Nursing information obtained from:  Family Demographic factors:  Male,Caucasian,Low socioeconomic status Current Mental Status:  Suicidal ideation indicated by patient Loss Factors:  NA Historical Factors:  Prior suicide attempts Risk Reduction Factors:  NA  Total Time spent with patient: 1 hour Principal Problem: Schizoaffective disorder, bipolar type (HCC) Diagnosis:  Principal Problem:   Schizoaffective disorder, bipolar type (HCC) Active Problems:   Hypertension   Diabetes mellitus without complication (HCC)   Tobacco use disorder  Subjective Data:  Patient is a 45 year old male with history of schizoaffective disorder, bipolar type presenting with worsening depression, suicidal ideations with plan to cut wrists, and auditory hallucinations. No acute overnight events, medication compliant, performing ADLs.   Patient seen one-on-one in office today. He reports that he has been off of Clozapine for about three days, and feels his depression and hallucinations have acutely worsened. He feels that his home medications work fairly well for him, but he does feel he needs better coping skills for anxiety and depression. He feels that the clients and staff are disrespectful towards him, and often pick fights with him. This causes him to "take it out on himself." When asked to elaborate, he notes that it makes him feel really sad and worthless. He notes that without his medications he often cannot sleep and has panic attacks in the middle of the night. He notes that his normal coping mechanism is walking outside, and getting "sunshine", however, with covid his ability to walk has been restricted. He also copes by keeping clean by showering, and by eating to help with mood. He feels that his current depression could be solved by switching group homes. He is somewhat fixated on going to [facility]. He requests that I call his guardian to discuss group homes with  her.   Contacted guardian, [name], [phone]. No answer, voicemail left. Our social work team was able to speak with her earlier today. Per chart review, "pt enters hospital when he decides that he does not want to live where he is and endorsed plan for him to return home to his current group home upon discharge. Patient has ACTT services through [facility] and guardian stated that they will assist him with finding another group home post discharge." Continued Clinical Symptoms:  Alcohol Use Disorder Identification Test Final Score (AUDIT): 0 The "Alcohol Use Disorders Identification Test", Guidelines for Use in Primary Care, Second Edition.  World Science writer [employer]). Score between 0-7:  no or low risk or alcohol related problems. Score between 8-15:  moderate risk of alcohol related problems. Score between 16-19:  high risk of alcohol related problems. Score 20 or above:  warrants further diagnostic evaluation for alcohol dependence and treatment.   CLINICAL FACTORS:   Severe Anxiety and/or Agitation Bipolar Disorder:   Depressive phase Currently Psychotic Previous Psychiatric Diagnoses and Treatments   Musculoskeletal: Strength & Muscle Tone: within normal limits Gait & Station: normal Patient leans: N/A  Psychiatric Specialty Exam: Physical Exam Vitals and nursing note reviewed.  Constitutional:      Appearance: Normal appearance.  HENT:     Head: Normocephalic and atraumatic.     Right Ear: External ear normal.     Left Ear: External ear normal.     Nose: Nose normal.     Mouth/Throat:     Mouth: Mucous membranes are moist.     Pharynx: Oropharynx is clear.  Eyes:     Extraocular Movements: Extraocular movements intact.  Conjunctiva/sclera: Conjunctivae normal.     Pupils: Pupils are equal, round, and reactive to light.  Cardiovascular:     Rate and Rhythm: Normal rate.     Pulses: Normal pulses.  Pulmonary:     Effort: Pulmonary effort is  normal.     Breath sounds: Normal breath sounds.  Abdominal:     General: Abdomen is flat.     Palpations: Abdomen is soft.  Musculoskeletal:        General: No swelling. Normal range of motion.     Cervical back: Normal range of motion and neck supple.  Skin:    General: Skin is warm and dry.  Neurological:     General: No focal deficit present.     Mental Status: He is alert and oriented to person, place, and time.  Psychiatric:        Attention and Perception: Attention normal. He perceives auditory hallucinations.        Mood and Affect: Affect normal. Mood is depressed.        Speech: Speech normal.        Behavior: Behavior is cooperative.        Thought Content: Thought content includes suicidal ideation. Thought content does not include homicidal ideation. Thought content includes suicidal plan.        Cognition and Memory: Cognition and memory normal.        Judgment: Judgment is inappropriate.     Review of Systems  Constitutional: Positive for fatigue. Negative for activity change.  HENT: Negative for rhinorrhea and sore throat.   Eyes: Negative for photophobia.  Respiratory: Negative for cough and shortness of breath.   Cardiovascular: Negative for chest pain and palpitations.  Gastrointestinal: Negative for constipation, diarrhea, nausea and vomiting.  Endocrine: Negative for cold intolerance and heat intolerance.  Genitourinary: Negative for difficulty urinating and dysuria.  Musculoskeletal: Negative for arthralgias and myalgias.  Skin: Negative for rash and wound.  Allergic/Immunologic: Negative for food allergies and immunocompromised state.  Neurological: Negative for dizziness and headaches.  Hematological: Negative for adenopathy. Does not bruise/bleed easily.  Psychiatric/Behavioral: Positive for dysphoric mood, hallucinations, sleep disturbance and suicidal ideas. The patient is nervous/anxious.     Blood pressure 114/80, pulse 72, temperature (!) 97.5  F (36.4 C), temperature source Oral, resp. rate 16, height 5' 10.5" (1.791 m), weight 81.2 kg, SpO2 100 %.Body mass index is 25.32 kg/m.  General Appearance: Casual  Eye Contact:  Good  Speech:  Clear and Coherent and Normal Rate  Volume:  Normal  Mood:  Depressed and Dysphoric  Affect:  Congruent  Thought Process:  Coherent  Orientation:  Full (Time, Place, and Person)  Thought Content:  Logical and Hallucinations: Auditory  Suicidal Thoughts:  Yes.  with intent/plan  Homicidal Thoughts:  No  Memory:  Immediate;   Fair Recent;   Fair Remote;   Fair  Judgement:  Impaired  Insight:  Shallow  Psychomotor Activity:  Normal  Concentration:  Concentration: Fair  Recall:  Poor  Fund of Knowledge:  Poor  Language:  Fair  Akathisia:  Negative  Handed:  Right  AIMS (if indicated):     Assets:  Desire for Improvement Financial Resources/Insurance Housing Social Support  ADL's:  Intact  Cognition:  Impaired,  Mild  Sleep:  Number of Hours: 8         COGNITIVE FEATURES THAT CONTRIBUTE TO RISK:  Polarized thinking    SUICIDE RISK:   Moderate:  Frequent suicidal ideation with limited intensity,  and duration, some specificity in terms of plans, no associated intent, good self-control, limited dysphoria/symptomatology, some risk factors present, and identifiable protective factors, including available and accessible social support.  PLAN OF CARE: Daily contact with patient to assess and evaluate symptoms and progress in treatment and Medication management Patient is a 45 year old male with history of schizoaffective disorder, bipolar type presenting with worsening depression, suicidal ideations with plan to cut wrists, and auditory hallucinations. Notes he has not been on Clozaril for three days prior to admission. ANC within normal limits. Restart home medications as above.   I certify that inpatient services furnished can reasonably be expected to improve the patient's condition.    [name], MD 07/16/2020, 4:21 PM

## 2020-07-16 NOTE — H&P (Signed)
Psychiatric Admission Assessment Adult  Patient Identification: Lance [name] MRN:  [id number] Date of Evaluation:  07/16/2020 Chief Complaint:  Schizoaffective disorder, bipolar type (HCC) [F25.0] Principal Diagnosis: Schizoaffective disorder, bipolar type (HCC) Diagnosis:  Principal Problem:   Schizoaffective disorder, bipolar type (HCC) Active Problems:   Hypertension   Diabetes mellitus without complication (HCC)   Tobacco use disorder  CC: "I need better coping skills."  History of Present Illness: Patient is a 45 year old male with history of schizoaffective disorder, bipolar type presenting with worsening depression, suicidal ideations with plan to cut wrists, and auditory hallucinations. No acute overnight events, medication compliant, performing ADLs.   Patient seen one-on-one in office today. He reports that he has been off of Clozapine for about three days, and feels his depression and hallucinations have acutely worsened. He feels that his home medications work fairly well for him, but he does feel he needs better coping skills for anxiety and depression. He feels that the clients and staff are disrespectful towards him, and often pick fights with him. This causes him to "take it out on himself." When asked to elaborate, he notes that it makes him feel really sad and worthless. He notes that without his medications he often cannot sleep and has panic attacks in the middle of the night. He notes that his normal coping mechanism is walking outside, and getting "sunshine", however, with covid his ability to walk has been restricted. He also copes by keeping clean by showering, and by eating to help with mood. He feels that his current depression could be solved by switching group homes. He is somewhat fixated on going to [facility]. He requests that I call his guardian to discuss group homes with her.   Contacted guardian, [name], [phone]. No answer, voicemail left. Our social  work team was able to speak with her earlier today. Per chart review, "pt enters hospital when he decides that he does not want to live where he is and endorsed plan for him to return home to his current group home upon discharge. Patient has ACTT services through [facility] and guardian stated that they will assist him with finding another group home post discharge."  Associated Signs/Symptoms: Depression Symptoms:  depressed mood, anhedonia, insomnia, feelings of worthlessness/guilt, difficulty concentrating, hopelessness, recurrent thoughts of death, suicidal thoughts with specific plan, Duration of Depression Symptoms: Less than two weeks  (Hypo) Manic Symptoms:  Hallucinations, Impulsivity, Anxiety Symptoms:  Excessive Worry, Panic Symptoms, Psychotic Symptoms:  Hallucinations: Auditory Duration of Psychotic Symptoms: No data recorded PTSD Symptoms: Negative Total Time spent with patient: 1 hour  Past Psychiatric History: Long history of schizoaffective disorder multiple hospitalizations.  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  History of suicide attempts in the past.    Is the patient at risk to self? Yes.    Has the patient been a risk to self in the past 6 months? Yes.    Has the patient been a risk to self within the distant past? Yes.    Is the patient a risk to others? No.  Has the patient been a risk to others in the past 6 months? No.  Has the patient been a risk to others within the distant past? No.   Prior Inpatient Therapy:   Prior Outpatient Therapy:    Alcohol Screening: 1. How often do you have a drink containing alcohol?: Never 2. How many drinks containing alcohol do you have on a typical day  when you are drinking?: 1 or 2 3. How often do you have six or more drinks on one occasion?: Never AUDIT-C Score: 0 4. How often during the last year have you found that you were not able to stop drinking once you had started?:  Never 5. How often during the last year have you failed to do what was normally expected from you because of drinking?: Never 6. How often during the last year have you needed a first drink in the morning to get yourself going after a heavy drinking session?: Never 7. How often during the last year have you had a feeling of guilt of remorse after drinking?: Never 8. How often during the last year have you been unable to remember what happened the night before because you had been drinking?: Never 9. Have you or someone else been injured as a result of your drinking?: No 10. Has a relative or friend or a doctor or another health worker been concerned about your drinking or suggested you cut down?: No Alcohol Use Disorder Identification Test Final Score (AUDIT): 0 Alcohol Brief Interventions/Follow-up: Continued Monitoring Substance Abuse History in the last 12 months:  No. Consequences of Substance Abuse: Negative Previous Psychotropic Medications: Yes  Psychological Evaluations: Yes  Past Medical History:  Past Medical History:  Diagnosis Date  . Depression   . Diabetes mellitus without complication (HCC)   . Hyperlipidemia   . Hypertension   . Lupus anticoagulant disorder (HCC) 06/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  . PE (pulmonary thromboembolism) (HCC)   . Schizo affective schizophrenia (HCC)   . Supratherapeutic INR 11/19/2016   History reviewed. No pertinent surgical history. Family History:  Family History  Problem Relation Age of Onset  . CAD Mother   . CAD Sister    Family Psychiatric  History:  Positive for mental health problems especially mood problems in the family Tobacco Screening: Have you used any form of tobacco in the last 30 days? (Cigarettes, Smokeless Tobacco, Cigars, and/or Pipes): Yes Tobacco use, Select all that apply: 5 or more cigarettes per day Are you interested in Tobacco Cessation Medications?: No, patient refused Counseled patient on  smoking cessation including recognizing danger situations, developing coping skills and basic information about quitting provided: Refused/Declined practical counseling Social History:  Social History   Substance and Sexual Activity  Alcohol Use No   Comment: occassionally     Social History   Substance and Sexual Activity  Drug Use Not Currently  . Types: Marijuana   Comment: Last use 11/29/16    Additional Social History: Marital status: Single Are you sexually active?: No What is your sexual orientation?: "None" Has your sexual activity been affected by drugs, alcohol, medication, or emotional stress?: N/A Does patient have children?: No                         Allergies:   Allergies  Allergen Reactions  . Penicillins Other (See Comments)    Reaction: "lockjaw" Has patient had a PCN reaction causing immediate rash, facial/tongue/throat swelling, SOB or lightheadedness with hypotension: No Has patient had a PCN reaction causing severe rash involving mucus membranes or skin necrosis: No Has patient had a PCN reaction that required hospitalization: No Has patient had a PCN reaction occurring within the last 10 years: No If all of the above answers are "NO", then may proceed with Cephalosporin use.    Lab Results:  Results for orders placed  or performed during the hospital encounter of 07/15/20 (from the past 48 hour(s))  Glucose, capillary     Status: Abnormal   Collection Time: 07/15/20  7:58 PM  Result Value Ref Range   Glucose-Capillary 131 (H) 70 - 99 mg/dL    Comment: Glucose reference range applies only to samples taken after fasting for at least 8 hours.   Comment 1 Notify RN   Glucose, capillary     Status: Abnormal   Collection Time: 07/16/20  6:36 AM  Result Value Ref Range   Glucose-Capillary 121 (H) 70 - 99 mg/dL    Comment: Glucose reference range applies only to samples taken after fasting for at least 8 hours.  Hemoglobin A1c     Status: None    Collection Time: 07/16/20  9:09 AM  Result Value Ref Range   Hgb A1c MFr Bld 5.3 4.8 - 5.6 %    Comment: (NOTE) Pre diabetes:          5.7%-6.4%  Diabetes:              >6.4%  Glycemic control for   <7.0% adults with diabetes    Mean Plasma Glucose 105.41 mg/dL    Comment: Performed at [facility], [address] [place], [place] [address]  Lipid panel     Status: Abnormal   Collection Time: 07/16/20  9:09 AM  Result Value Ref Range   Cholesterol 135 0 - 200 mg/dL   Triglycerides 604226 (H) <150 mg/dL   HDL 37 (L) >54>40 mg/dL   Total CHOL/HDL Ratio 3.6 RATIO   VLDL 45 (H) 0 - 40 mg/dL   LDL Cholesterol 53 0 - 99 mg/dL    Comment:        Total Cholesterol/HDL:CHD Risk Coronary Heart Disease Risk Table                     Men   Women  1/2 Average Risk   3.4   3.3  Average Risk       5.0   4.4  2 X Average Risk   9.6   7.1  3 X Average Risk  23.4   11.0        Use the calculated Patient Ratio above and the CHD Risk Table to determine the patient's CHD Risk.        ATP III CLASSIFICATION (LDL):  <100     mg/dL   Optimal  098-119100-129  mg/dL   Near or Above                    Optimal  130-159  mg/dL   Borderline  [id number]  mg/dL   High  >562>190     mg/dL   Very High Performed at [facility], [address] [place], [place] [address]   Glucose, capillary     Status: Abnormal   Collection Time: 07/16/20 11:21 AM  Result Value Ref Range   Glucose-Capillary 103 (H) 70 - 99 mg/dL    Comment: Glucose reference range applies only to samples taken after fasting for at least 8 hours.    Blood Alcohol level:  Lab Results  Component Value Date   [facility]   ETH <10 01/04/2020    Metabolic Disorder Labs:  Lab Results  Component Value Date   HGBA1C 5.3 07/16/2020   MPG 105.41 07/16/2020   MPG 105.41 07/14/2020   Lab Results  Component Value Date  PROLACTIN 2.0 (L) 08/31/2016   PROLACTIN 4.8 05/22/2016   Lab Results  Component Value Date   CHOL 135  07/16/2020   TRIG 226 (H) 07/16/2020   HDL 37 (L) 07/16/2020   CHOLHDL 3.6 07/16/2020   VLDL 45 (H) 07/16/2020   LDLCALC 53 07/16/2020   LDLCALC UNABLE TO CALCULATE IF TRIGLYCERIDE OVER 400 mg/dL 16/03/9603    Current Medications: Current Facility-Administered Medications  Medication Dose Route Frequency Provider Last Rate Last Admin  . acetaminophen (TYLENOL) tablet 650 mg  650 mg Oral Q6H PRN Clapacs, [name], MD      . albuterol (VENTOLIN HFA) 108 (90 Base) MCG/ACT inhaler 1-2 puff  1-2 puff Inhalation Q6H PRN [name], MD      . alum & mag hydroxide-simeth (MAALOX/MYLANTA) 200-200-20 MG/5ML suspension 30 mL  30 mL Oral Q4H PRN Clapacs, [name], MD      . apixaban (ELIQUIS) tablet 5 mg  5 mg Oral BID Clapacs, [name], MD   5 mg at 07/16/20 0757  . cloZAPine (CLOZARIL) tablet 500 mg  500 mg Oral QHS Clapacs, [name], MD   500 mg at 07/15/20 2120  . fluticasone furoate-vilanterol (BREO ELLIPTA) 200-25 MCG/INH 1 puff  1 puff Inhalation Daily [name], MD      . hydrOXYzine (ATARAX/VISTARIL) tablet 50 mg  50 mg Oral TID PRN Clapacs, [name], MD      . influenza vac split quadrivalent PF (FLUARIX) injection 0.5 mL  0.5 mL Intramuscular Tomorrow-1000 [name], MD      . insulin aspart (novoLOG) injection 0-15 Units  0-15 Units Subcutaneous Q4H Clapacs, [name], MD   2 Units at 07/16/20 [phone]  . lisinopril (ZESTRIL) tablet 2.5 mg  2.5 mg Oral Daily Clapacs, [name], MD   2.5 mg at 07/16/20 0757  . lithium carbonate (ESKALITH) CR tablet 450 mg  450 mg Oral Q12H Clapacs, [name], MD   450 mg at 07/16/20 0757  . magnesium hydroxide (MILK OF MAGNESIA) suspension 30 mL  30 mL Oral Daily PRN Clapacs, [name], MD      . metFORMIN (GLUCOPHAGE) tablet 1,000 mg  1,000 mg Oral BID WC Clapacs, [name], MD   1,000 mg at 07/16/20 0757  . OLANZapine (ZYPREXA) tablet 10 mg  10 mg Oral BID Clapacs, [name], MD   10 mg at 07/16/20 0758  . sertraline (ZOLOFT) tablet 50 mg  50 mg Oral Daily Clapacs,  [name], MD   50 mg at 07/16/20 0757  . simvastatin (ZOCOR) tablet 40 mg  40 mg Oral q1800 Clapacs, [name], MD      . traZODone (DESYREL) tablet 100 mg  100 mg Oral QHS Clapacs, [name], MD   100 mg at 07/15/20 2120  . traZODone (DESYREL) tablet 100 mg  100 mg Oral QHS PRN Clapacs, [name], MD       PTA Medications: Medications Prior to Admission  Medication Sig Dispense Refill Last Dose  . albuterol (VENTOLIN HFA) 108 (90 Base) MCG/ACT inhaler Inhale 1-2 puffs into the lungs every 6 (six) hours as needed for wheezing or shortness of breath. 18 g 1   . ammonium lactate (AMLACTIN) 12 % cream APPY TOPICALLY AS NEEDED FOR DRY SKIN 385 g 10   . apixaban (ELIQUIS) 5 MG TABS tablet Take 1 tablet (5 mg total) by mouth 2 (two) times daily. 60 tablet 6   . cloZAPine (CLOZARIL) 100 MG tablet Take 5 tablets (500  mg total) by mouth at bedtime. 150 tablet 1   . fluticasone furoate-vilanterol (BREO ELLIPTA) 200-25 MCG/INH AEPB Inhale 1 puff into the lungs daily. 30 each 6   . glucose blood (FORA V30A BLOOD GLUCOSE TEST) test strip Use as instructed 100 each 12   . LEVEMIR FLEXTOUCH 100 UNIT/ML FlexPen Inject into the skin.     Marland Kitchen lisinopril (ZESTRIL) 2.5 MG tablet Take 1 tablet (2.5 mg total) by mouth daily. 30 tablet 1   . lithium carbonate (ESKALITH) 450 MG CR tablet Take 1 tablet (450 mg total) by mouth every 12 (twelve) hours. 60 tablet 1   . metFORMIN (GLUCOPHAGE) 1000 MG tablet Take 1 tablet (1,000 mg total) by mouth 2 (two) times daily with a meal. 60 tablet 1   . NOVOFINE AUTOCOVER 30G X 8 MM MISC      . sertraline (ZOLOFT) 50 MG tablet Take 1 tablet (50 mg total) by mouth daily. 30 tablet 1   . simvastatin (ZOCOR) 40 MG tablet Take 1 tablet (40 mg total) by mouth daily at 6 PM. 30 tablet 6   . traZODone (DESYREL) 100 MG tablet Take 1 tablet (100 mg total) by mouth at bedtime. 30 tablet 1     Musculoskeletal: Strength & Muscle Tone: within normal limits Gait & Station: normal Patient leans:  N/A  Psychiatric Specialty Exam: Physical Exam Vitals and nursing note reviewed.  Constitutional:      Appearance: Normal appearance.  HENT:     Head: Normocephalic and atraumatic.     Right Ear: External ear normal.     Left Ear: External ear normal.     Nose: Nose normal.     Mouth/Throat:     Mouth: Mucous membranes are moist.     Pharynx: Oropharynx is clear.  Eyes:     Extraocular Movements: Extraocular movements intact.     Conjunctiva/sclera: Conjunctivae normal.     Pupils: Pupils are equal, round, and reactive to light.  Cardiovascular:     Rate and Rhythm: Normal rate.     Pulses: Normal pulses.  Pulmonary:     Effort: Pulmonary effort is normal.     Breath sounds: Normal breath sounds.  Abdominal:     General: Abdomen is flat.     Palpations: Abdomen is soft.  Musculoskeletal:        General: No swelling. Normal range of motion.     Cervical back: Normal range of motion and neck supple.  Skin:    General: Skin is warm and dry.  Neurological:     General: No focal deficit present.     Mental Status: He is alert and oriented to person, place, and time.  Psychiatric:        Attention and Perception: Attention normal. He perceives auditory hallucinations.        Mood and Affect: Affect normal. Mood is depressed.        Speech: Speech normal.        Behavior: Behavior is cooperative.        Thought Content: Thought content includes suicidal ideation. Thought content does not include homicidal ideation. Thought content includes suicidal plan.        Cognition and Memory: Cognition and memory normal.        Judgment: Judgment is inappropriate.     Review of Systems  Constitutional: Positive for fatigue. Negative for activity change.  HENT: Negative for rhinorrhea and sore throat.   Eyes: Negative for photophobia.  Respiratory: Negative for  cough and shortness of breath.   Cardiovascular: Negative for chest pain and palpitations.  Gastrointestinal: Negative for  constipation, diarrhea, nausea and vomiting.  Endocrine: Negative for cold intolerance and heat intolerance.  Genitourinary: Negative for difficulty urinating and dysuria.  Musculoskeletal: Negative for arthralgias and myalgias.  Skin: Negative for rash and wound.  Allergic/Immunologic: Negative for food allergies and immunocompromised state.  Neurological: Negative for dizziness and headaches.  Hematological: Negative for adenopathy. Does not bruise/bleed easily.  Psychiatric/Behavioral: Positive for dysphoric mood, hallucinations, sleep disturbance and suicidal ideas. The patient is nervous/anxious.     Blood pressure 114/80, pulse 72, temperature (!) 97.5 F (36.4 C), temperature source Oral, resp. rate 16, height 5' 10.5" (1.791 m), weight 81.2 kg, SpO2 100 %.Body mass index is 25.32 kg/m.  General Appearance: Casual  Eye Contact:  Good  Speech:  Clear and Coherent and Normal Rate  Volume:  Normal  Mood:  Depressed and Dysphoric  Affect:  Congruent  Thought Process:  Coherent  Orientation:  Full (Time, Place, and Person)  Thought Content:  Logical and Hallucinations: Auditory  Suicidal Thoughts:  Yes.  with intent/plan  Homicidal Thoughts:  No  Memory:  Immediate;   Fair Recent;   Fair Remote;   Fair  Judgement:  Impaired  Insight:  Shallow  Psychomotor Activity:  Normal  Concentration:  Concentration: Fair  Recall:  Poor  Fund of Knowledge:  Poor  Language:  Fair  Akathisia:  Negative  Handed:  Right  AIMS (if indicated):     Assets:  Desire for Improvement Financial Resources/Insurance Housing Social Support  ADL's:  Intact  Cognition:  Impaired,  Mild  Sleep:  Number of Hours: 8    Treatment Plan Summary: Daily contact with patient to assess and evaluate symptoms and progress in treatment and Medication management Patient is a 45 year old male with history of schizoaffective disorder, bipolar type presenting with worsening depression, suicidal ideations with plan  to cut wrists, and auditory hallucinations. Notes he has not been on Clozaril for three days prior to admission. ANC within normal limits. Restart home medications as above.   Observation Level/Precautions:  15 minute checks  Laboratory:  Completed in ED  Psychotherapy:    Medications:    Consultations:    Discharge Concerns:    Estimated LOS:  Other:     Physician Treatment Plan for Primary Diagnosis: Schizoaffective disorder, bipolar type (HCC) Long Term Goal(s): Improvement in symptoms so as ready for discharge  Short Term Goals: Ability to identify changes in lifestyle to reduce recurrence of condition will improve, Ability to verbalize feelings will improve, Ability to disclose and discuss suicidal ideas, Ability to demonstrate self-control will improve, Ability to identify and develop effective coping behaviors will improve and Compliance with prescribed medications will improve  Physician Treatment Plan for Secondary Diagnosis: Principal Problem:   Schizoaffective disorder, bipolar type (HCC) Active Problems:   Hypertension   Diabetes mellitus without complication (HCC)   Tobacco use disorder  Long Term Goal(s): Improvement in symptoms so as ready for discharge  Short Term Goals: Ability to identify changes in lifestyle to reduce recurrence of condition will improve, Ability to verbalize feelings will improve, Ability to disclose and discuss suicidal ideas, Ability to demonstrate self-control will improve, Ability to identify and develop effective coping behaviors will improve and Compliance with prescribed medications will improve  I certify that inpatient services furnished can reasonably be expected to improve the patient's condition.    [name], MD 2/1/20224:11 PM

## 2020-07-16 NOTE — Plan of Care (Signed)
Pt rates depression 5/10 and denies anxiety, AVH, and HI. Pt has passive SI with no plan and contracts for safety. Pt was educated on care plan and verbalizes understanding. Torrie Mayers RN Problem: Education: Goal: Knowledge of [facility] Education information/materials will improve Outcome: Progressing Goal: Emotional status will improve Outcome: Progressing Goal: Mental status will improve Outcome: Progressing Goal: Verbalization of understanding the information provided will improve Outcome: Progressing   Problem: Coping: Goal: Ability to verbalize frustrations and anger appropriately will improve Outcome: Progressing Goal: Ability to demonstrate self-control will improve Outcome: Progressing   Problem: Health Behavior/Discharge Planning: Goal: Identification of resources available to assist in meeting health care needs will improve Outcome: Progressing   Problem: Safety: Goal: Periods of time without injury will increase Outcome: Progressing   Problem: Education: Goal: Utilization of techniques to improve thought processes will improve Outcome: Progressing Goal: Knowledge of the prescribed therapeutic regimen will improve Outcome: Progressing   Problem: Health Behavior/Discharge Planning: Goal: Ability to make decisions will improve Outcome: Progressing Goal: Compliance with therapeutic regimen will improve Outcome: Progressing   Problem: Role Relationship: Goal: Will demonstrate positive changes in social behaviors and relationships Outcome: Progressing   Problem: Self-Concept: Goal: Will verbalize positive feelings about self Outcome: Progressing Goal: Level of anxiety will decrease Outcome: Progressing

## 2020-07-16 NOTE — [facility] Suicide Risk Assessment (Addendum)
[facility] INPATIENT:  Family/Significant Other Suicide Prevention Education  Suicide Prevention Education:  Education Completed; Lance [name] [phone], has been identified by the patient as the family member/significant other with whom the patient will be residing, and identified as the person(s) who will aid the patient in the event of a mental health crisis (suicidal ideations/suicide attempt).  With written consent from the patient, the family member/significant other has been provided the following suicide prevention education, prior to the and/or following the discharge of the patient.  The suicide prevention education provided includes the following:  Suicide risk factors  Suicide prevention and interventions  National Suicide Hotline telephone number  [facility] assessment telephone number  [facility] Emergency Assistance 911  [facility] and/or Residential Mobile Crisis Unit telephone number  Request made of family/significant other to:  Remove weapons (e.g., guns, rifles, knives), all items previously/currently identified as safety concern.    Remove drugs/medications (over-the-counter, prescriptions, illicit drugs), all items previously/currently identified as a safety concern.  The family member/significant other verbalizes understanding of the suicide prevention education information provided.  The family member/significant other agrees to remove the items of safety concern listed above.  Lance [name] reported that patient receives outpatient services through [facility] [facility]. She stated that she was not really sure about what brought him into the hospital as he had initially said he was going for physical health reasons but then he had not been taking his medication for 3 days and was hearing things. Lance [name] stated that patient has a history of coming into hospital when he is tired of living where he is. Harris and CSW discussed need for guardianship  paperwork. Consents sent to email ([email or web address]) per guardian request. She stated that she would send completed consents back with guardianship paperwork.   Lance [name] 07/16/2020, 12:00 PM
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
## 2020-07-16 NOTE — [facility] Group Notes (Signed)
[facility] Group Notes:  (Nursing/MHT/Case Management/Adjunct)  Date:  07/16/2020  Time:  12:36 AM  Type of Therapy:  Group Therapy  Participation Level:  Active  Participation Quality:  Appropriate  Affect:  Appropriate  Cognitive:  Alert  Insight:  Good  Engagement in Group:  Engaged and he reporting that he had a stressful day and how deal with stress day is mediation.  Modes of Intervention:  Support  Summary of Progress/Problems:  Mayra Neer 07/16/2020, 12:36 AM

## 2020-07-16 NOTE — Progress Notes (Signed)
Pt has been somewhat social but mainly pacing on the unit. Pt has been sullen, calm and cooperative. Pt is anticipating a better year ahead. Torrie Mayers RN

## 2020-07-16 NOTE — [facility] Group Notes (Signed)
LCSW Group Therapy Note  07/16/2020 2:37 PM  Type of Therapy/Topic:  Group Therapy:  Feelings about Diagnosis  Participation Level:  Active   Description of Group:   This group will allow patients to explore their thoughts and feelings about diagnoses they have received. Patients will be guided to explore their level of understanding and acceptance of these diagnoses. Facilitator will encourage patients to process their thoughts and feelings about the reactions of others to their diagnosis and will guide patients in identifying ways to discuss their diagnosis with significant others in their lives. This group will be process-oriented, with patients participating in exploration of their own experiences, giving and receiving support, and processing challenge from other group members.   Therapeutic Goals: 1. Patient will demonstrate understanding of diagnosis as evidenced by identifying two or more symptoms of the disorder 2. Patient will be able to express two feelings regarding the diagnosis 3. Patient will demonstrate their ability to communicate their needs through discussion and/or role play  Summary of Patient Progress: Patient was present for the entirety of the group. He was active and involved in the conversation. Patient identified feeling shame and "different than others" around his mental health diagnosis. He inquired about ability to be cured of schizophrenia. His comments and questions were pertinent to the discussion.    Therapeutic Modalities:   Cognitive Behavioral Therapy Brief Therapy Feelings Identification   Azan Maneri R. Algis Greenhouse, MSW, LCSW, [name] 07/16/2020 2:37 PM

## 2020-07-16 NOTE — Progress Notes (Signed)
Recreation Therapy Notes   Date: 07/16/2020  Time: 9:30 am   Location: Craft room   Behavioral response: Appropriate  Intervention Topic: Self-care   Discussion/Intervention:  Group content today was focused on Self-Care. The group defined self-care and some positive ways they care for themselves. Individuals expressed ways and reasons why they neglected any self-care in the past. Patients described ways to improve self-care in the future. The group explained what could happen if they did not do any self-care activities at all. The group participated in the intervention "self-care assessment" where they had a chance to discover some of their weaknesses and strengths in self- care. Patient came up with a self-care plan to improve themselves in the future.  Clinical Observations/Feedback: Patient came to group late due to unknown reasons. Individual was social with staff while participating in the intervention. Gabreil Yonkers LRT/CTRS         Alonda Weaber 07/16/2020 11:55 AM

## 2020-07-17 DIAGNOSIS — F250 Schizoaffective disorder, bipolar type: Secondary | ICD-10-CM | POA: Diagnosis not present

## 2020-07-17 LAB — GLUCOSE, CAPILLARY
Glucose-Capillary: 138 mg/dL — ABNORMAL HIGH (ref 70–99)
Glucose-Capillary: 86 mg/dL (ref 70–99)
Glucose-Capillary: 97 mg/dL (ref 70–99)
Glucose-Capillary: 80 mg/dL (ref 70–99)

## 2020-07-17 MED ORDER — NICOTINE POLACRILEX 2 MG MT GUM
2.0000 mg | CHEWING_GUM | OROMUCOSAL | Status: DC | PRN
  Administered 2020-07-17 – 2020-07-22 (×3): 2 mg via ORAL
  Filled 2020-07-17 (×3): qty 1

## 2020-07-17 NOTE — Tx Team (Addendum)
Interdisciplinary Treatment and Diagnostic Plan Update  07/17/2020 Time of Session: 9:00AM Lance [name] MRN: [id number]  Principal Diagnosis: Schizoaffective disorder, bipolar type [facility])  Secondary Diagnoses: Principal Problem:   Schizoaffective disorder, bipolar type (HCC) Active Problems:   Hypertension   Diabetes mellitus without complication (HCC)   Tobacco use disorder   Current Medications:  Current Facility-Administered Medications  Medication Dose Route Frequency Provider Last Rate Last Admin  . acetaminophen (TYLENOL) tablet 650 mg  650 mg Oral Q6H PRN Clapacs, John [name], [name]      . albuterol (VENTOLIN HFA) 108 (90 Base) MCG/ACT inhaler 1-2 puff  1-2 puff Inhalation Q6H PRN Lance [name]      . alum & mag hydroxide-simeth (MAALOX/MYLANTA) 200-200-20 MG/5ML suspension 30 mL  30 mL Oral Q4H PRN Clapacs, Lance [name]      . apixaban (ELIQUIS) tablet 5 mg  5 mg Oral BID Clapacs, Lance [name]   5 mg at 07/17/20 0736  . cloZAPine (CLOZARIL) tablet 500 mg  500 mg Oral QHS Clapacs, Lance [name]   500 mg at 07/16/20 2058  . fluticasone furoate-vilanterol (BREO ELLIPTA) 200-25 MCG/INH 1 puff  1 puff Inhalation Daily Lance [name]   1 puff at 07/17/20 0737  . hydrOXYzine (ATARAX/VISTARIL) tablet 50 mg  50 mg Oral TID PRN Clapacs, Lance [name]   50 mg at 07/16/20 2058  . influenza vac split quadrivalent PF (FLUARIX) injection 0.5 mL  0.5 mL Intramuscular Tomorrow-1000 Lance [name]      . insulin aspart (novoLOG) injection 0-15 Units  0-15 Units Subcutaneous Q4H Clapacs, Lance [name]   2 Units at 07/17/20 [phone]  . lisinopril (ZESTRIL) tablet 2.5 mg  2.5 mg Oral Daily Clapacs, Lance [name]   2.5 mg at 07/17/20 0736  . lithium carbonate (ESKALITH) CR tablet 450 mg  450 mg Oral Q12H Clapacs, Lance [name]   450 mg at 07/17/20 0736  . magnesium hydroxide (MILK OF MAGNESIA) suspension 30 mL  30 mL Oral Daily PRN Clapacs, John [name], [name]      . metFORMIN (GLUCOPHAGE) tablet 1,000 mg  1,000 mg  Oral BID WC Clapacs, Lance [name]   1,000 mg at 07/17/20 0735  . OLANZapine (ZYPREXA) tablet 10 mg  10 mg Oral BID Clapacs, Lance [name]   10 mg at 07/17/20 0736  . sertraline (ZOLOFT) tablet 50 mg  50 mg Oral Daily Clapacs, Lance [name]   50 mg at 07/17/20 0735  . simvastatin (ZOCOR) tablet 40 mg  40 mg Oral q1800 Clapacs, Lance [name]   40 mg at 07/16/20 1703  . traZODone (DESYREL) tablet 100 mg  100 mg Oral QHS Clapacs, Lance [name]   100 mg at 07/16/20 2059  . traZODone (DESYREL) tablet 100 mg  100 mg Oral QHS PRN Clapacs, Lance [name]       PTA Medications: Medications Prior to Admission  Medication Sig Dispense Refill Last Dose  . albuterol (VENTOLIN HFA) 108 (90 Base) MCG/ACT inhaler Inhale 1-2 puffs into the lungs every 6 (six) hours as needed for wheezing or shortness of breath. 18 g 1   . ammonium lactate (AMLACTIN) 12 % cream APPY TOPICALLY AS NEEDED FOR DRY SKIN 385 g 10   . apixaban (ELIQUIS) 5 MG TABS tablet Take 1 tablet (5 mg total) by mouth 2 (two) times daily. 60 tablet 6   . cloZAPine (CLOZARIL) 100 MG tablet Take 5 tablets (500  mg total) by mouth at bedtime. 150 tablet 1   . fluticasone furoate-vilanterol (BREO ELLIPTA) 200-25 MCG/INH AEPB Inhale 1 puff into the lungs daily. 30 each 6   . glucose blood (FORA V30A BLOOD GLUCOSE TEST) test strip Use as instructed 100 each 12   . LEVEMIR FLEXTOUCH 100 UNIT/ML FlexPen Inject into the skin.     Lance [name] lisinopril (ZESTRIL) 2.5 MG tablet Take 1 tablet (2.5 mg total) by mouth daily. 30 tablet 1   . lithium carbonate (ESKALITH) 450 MG CR tablet Take 1 tablet (450 mg total) by mouth every 12 (twelve) hours. 60 tablet 1   . metFORMIN (GLUCOPHAGE) 1000 MG tablet Take 1 tablet (1,000 mg total) by mouth 2 (two) times daily with a meal. 60 tablet 1   . NOVOFINE AUTOCOVER 30G X 8 MM MISC      . sertraline (ZOLOFT) 50 MG tablet Take 1 tablet (50 mg total) by mouth daily. 30 tablet 1   . simvastatin (ZOCOR) 40 MG tablet Take 1 tablet (40 mg total) by mouth  daily at 6 PM. 30 tablet 6   . traZODone (DESYREL) 100 MG tablet Take 1 tablet (100 mg total) by mouth at bedtime. 30 tablet 1     Patient Stressors: Financial difficulties Medication change or noncompliance  Patient Strengths: Average or above average Radio producer for treatment/growth Physical Health  Treatment Modalities: Medication Management, Group therapy, Case management,  1 to 1 session with clinician, Psychoeducation, Recreational therapy.   Physician Treatment Plan for Primary Diagnosis: Schizoaffective disorder, bipolar type (HCC) Long Term Goal(s): Improvement in symptoms so as ready for discharge Improvement in symptoms so as ready for discharge   Short Term Goals: Ability to identify changes in lifestyle to reduce recurrence of condition will improve Ability to verbalize feelings will improve Ability to disclose and discuss suicidal ideas Ability to demonstrate self-control will improve Ability to identify and develop effective coping behaviors will improve Compliance with prescribed medications will improve Ability to identify changes in lifestyle to reduce recurrence of condition will improve Ability to verbalize feelings will improve Ability to disclose and discuss suicidal ideas Ability to demonstrate self-control will improve Ability to identify and develop effective coping behaviors will improve Compliance with prescribed medications will improve  Medication Management: Evaluate patient's response, side effects, and tolerance of medication regimen.  Therapeutic Interventions: 1 to 1 sessions, Unit Group sessions and Medication administration.  Evaluation of Outcomes: Progressing  Physician Treatment Plan for Secondary Diagnosis: Principal Problem:   Schizoaffective disorder, bipolar type (HCC) Active Problems:   Hypertension   Diabetes mellitus without complication (HCC)   Tobacco use disorder  Long Term Goal(s):  Improvement in symptoms so as ready for discharge Improvement in symptoms so as ready for discharge   Short Term Goals: Ability to identify changes in lifestyle to reduce recurrence of condition will improve Ability to verbalize feelings will improve Ability to disclose and discuss suicidal ideas Ability to demonstrate self-control will improve Ability to identify and develop effective coping behaviors will improve Compliance with prescribed medications will improve Ability to identify changes in lifestyle to reduce recurrence of condition will improve Ability to verbalize feelings will improve Ability to disclose and discuss suicidal ideas Ability to demonstrate self-control will improve Ability to identify and develop effective coping behaviors will improve Compliance with prescribed medications will improve     Medication Management: Evaluate patient's response, side effects, and tolerance of medication regimen.  Therapeutic Interventions: 1 to 1 sessions, Unit Group  sessions and Medication administration.  Evaluation of Outcomes: Progressing   RN Treatment Plan for Primary Diagnosis: Schizoaffective disorder, bipolar type (HCC) Long Term Goal(s): Knowledge of disease and therapeutic regimen to maintain health will improve  Short Term Goals: Ability to demonstrate self-control, Ability to participate in decision making will improve, Ability to verbalize feelings will improve, Ability to disclose and discuss suicidal ideas, Ability to identify and develop effective coping behaviors will improve and Compliance with prescribed medications will improve  Medication Management: RN will administer medications as ordered by provider, will assess and evaluate patient's response and provide education to patient for prescribed medication. RN will report any adverse and/or side effects to prescribing provider.  Therapeutic Interventions: 1 on 1 counseling sessions, Psychoeducation, Medication  administration, Evaluate responses to treatment, Monitor vital signs and CBGs as ordered, Perform/monitor CIWA, COWS, AIMS and Fall Risk screenings as ordered, Perform wound care treatments as ordered.  Evaluation of Outcomes: Progressing   LCSW Treatment Plan for Primary Diagnosis: Schizoaffective disorder, bipolar type (HCC) Long Term Goal(s): Safe transition to appropriate next level of care at discharge, Engage patient in therapeutic group addressing interpersonal concerns.  Short Term Goals: Engage patient in aftercare planning with referrals and resources, Increase social support, Increase ability to appropriately verbalize feelings, Increase emotional regulation, Facilitate acceptance of mental health diagnosis and concerns and Increase skills for wellness and recovery  Therapeutic Interventions: Assess for all discharge needs, 1 to 1 time with Social worker, Explore available resources and support systems, Assess for adequacy in community support network, Educate family and significant other(s) on suicide prevention, Complete Psychosocial Assessment, Interpersonal group therapy.  Evaluation of Outcomes: Progressing   Progress in Treatment: Attending groups: Yes. Participating in groups: Yes. Taking medication as prescribed: Yes. Toleration medication: Yes. Family/Significant other contact made: Yes, individual(s) contacted:  SPE completed with the pt's guardian Patient understands diagnosis: Yes. Discussing patient identified problems/goals with staff: Yes. Medical problems stabilized or resolved: Yes. Denies suicidal/homicidal ideation: Yes. Issues/concerns per patient self-inventory: No. Other: none  New problem(s) identified: No, Describe:  none  New Short Term/Long Term Goal(s):  detox, elimination of symptoms of psychosis, medication management for mood stabilization; elimination of SI thoughts; development of comprehensive mental wellness/sobriety plan.  Patient Goals:   Been staying at the group home for the past 2 years and it's been hard the whole time, I was hoping you could find somewhere else"  Discharge Plan or Barriers: CSW will assist patient in developing an appropriate aftercare plan.  Per guardian plan is for patient to return to group home and continue with current ACTT services.   Reason for Continuation of Hospitalization: Anxiety Depression Medication stabilization Suicidal ideation  Estimated Length of Stay:  1-7 days  Recreational Therapy: Patient Stressors: N/A Patient Goal: Patient will engage in groups without prompting or encouragement from LRT x3 group sessions within 5 recreation therapy group sessions.  Attendees: Patient: Lance [name] 07/17/2020 9:44 AM  Physician: Dr. Neale Burly, [name] 07/17/2020 9:44 AM  Nursing: Cecille Amsterdam, RN 07/17/2020 9:44 AM  RN Care Manager: 07/17/2020 9:44 AM  Social Worker: Penni Homans, MSW, LCSW 07/17/2020 9:44 AM  Recreational Therapist: Garret Reddish, Drue Flirt, LRT 07/17/2020 9:44 AM  Other: Jillyn Hidden, LCSW 07/17/2020 9:44 AM  Other:  07/17/2020 9:44 AM  Other: 07/17/2020 9:44 AM    Scribe for Treatment Team: [name], LCSW 07/17/2020 9:44 AM

## 2020-07-17 NOTE — Progress Notes (Signed)
Pt has reading glasses now. I suggested them to him earlier when he was trying to read his breakfast menu. He says that they help. Pt has a sullen affect, med compliant, calm and cooperative. Pt received nicotine gum once. Torrie Mayers RN

## 2020-07-17 NOTE — Plan of Care (Signed)
Pt rates anxiety 5/10. Pt denies SI and HI . Pt says he has AVH. Pt was educated on care plan and verbalizes understanding. Torrie Mayers RN Problem: Education: Goal: Knowledge of [facility] Education information/materials will improve Outcome: Progressing Goal: Emotional status will improve Outcome: Progressing Goal: Mental status will improve Outcome: Progressing Goal: Verbalization of understanding the information provided will improve Outcome: Progressing   Problem: Coping: Goal: Ability to verbalize frustrations and anger appropriately will improve Outcome: Progressing Goal: Ability to demonstrate self-control will improve Outcome: Progressing   Problem: Health Behavior/Discharge Planning: Goal: Identification of resources available to assist in meeting health care needs will improve Outcome: Progressing   Problem: Safety: Goal: Periods of time without injury will increase Outcome: Progressing   Problem: Education: Goal: Utilization of techniques to improve thought processes will improve Outcome: Progressing Goal: Knowledge of the prescribed therapeutic regimen will improve Outcome: Progressing   Problem: Health Behavior/Discharge Planning: Goal: Ability to make decisions will improve Outcome: Progressing Goal: Compliance with therapeutic regimen will improve Outcome: Progressing   Problem: Role Relationship: Goal: Will demonstrate positive changes in social behaviors and relationships Outcome: Progressing   Problem: Self-Concept: Goal: Will verbalize positive feelings about self Outcome: Progressing Goal: Level of anxiety will decrease Outcome: Progressing

## 2020-07-17 NOTE — Progress Notes (Signed)
Recreation Therapy Notes  INPATIENT RECREATION THERAPY ASSESSMENT  Patient Details Name: Salmaan Patchin MRN: [id number] DOB: 10-10-1975 Today's Date: 07/17/2020       Information Obtained From: Patient  Able to Participate in Assessment/Interview: Yes  Patient Presentation: Responsive  Reason for Admission (Per Patient): Active Symptoms,Suicidal Ideation  Patient Stressors:    Coping Skills:   Music  Leisure Interests (2+):  Exercise - [employer] - Play instrument,Music - Listen  Frequency of Recreation/Participation: Monthly  Awareness of Community Resources:  Yes  Community Resources:  Park  Current Use: No  If no, Barriers?: Transportation  Expressed Interest in [employer] Information: Yes  [place]  Patient Main Form of Transportation: Other (Comment) (Group Home)  Patient Strengths:  Music  Patient Identified Areas of Improvement:  Find a new group home  Patient Goal for Hospitalization:  Get back to [facility]  Current SI (including self-harm):  No  Current HI:  No  Current AVH: Yes  Staff Intervention Plan: Group Attendance,Collaborate with Interdisciplinary Treatment Team  Consent to Intern Participation: N/A  Lourie Retz 07/17/2020, 2:53 PM

## 2020-07-17 NOTE — Progress Notes (Signed)
Recreation Therapy Notes  INPATIENT RECREATION TR PLAN  Patient Details Name: Garnet Overfield MRN: [id number] DOB: [birth year] Today's Date: 07/17/2020  Rec Therapy Plan Treatment times per week: at least 3 Estimated Length of Stay: 5-7 days TR Treatment/Interventions: Group participation (Comment)  Discharge Criteria Pt will be discharged from therapy if:: Discharged Treatment plan/goals/alternatives discussed and agreed upon by:: Patient/family  Discharge Summary     Tramya Schoenfelder 07/17/2020, 2:56 PM

## 2020-07-17 NOTE — [facility] Group Notes (Signed)
LCSW Group Therapy Note  07/17/2020 2:26 PM  Type of Therapy/Topic:  Group Therapy:  Emotion Regulation  Participation Level:  Minimal   Description of Group:   The purpose of this group is to assist patients in learning to regulate negative emotions and experience positive emotions. Patients will be guided to discuss ways in which they have been vulnerable to their negative emotions. These vulnerabilities will be juxtaposed with experiences of positive emotions or situations, and patients will be challenged to use positive emotions to combat negative ones. Special emphasis will be placed on coping with negative emotions in conflict situations, and patients will process healthy conflict resolution skills.  Therapeutic Goals: 1. Patient will identify two positive emotions or experiences to reflect on in order to balance out negative emotions 2. Patient will label two or more emotions that they find the most difficult to experience 3. Patient will demonstrate positive conflict resolution skills through discussion and/or role plays  Summary of Patient Progress: Patient was present in group. Patient did participate some.  Patient was distracted in wanting to discuss his group home situation.  Patient struggled with relating to the skills discussed.  Patient did leave group early.    Therapeutic Modalities:   Cognitive Behavioral Therapy Feelings Identification Dialectical Behavioral Therapy  Penni Homans, MSW, LCSW 07/17/2020 2:26 PM

## 2020-07-17 NOTE — Progress Notes (Signed)
[facility] MD Progress Note  07/17/2020 12:43 PM Casten Gregorich  MRN:  [id number]  CC: "I feel blah"  Subjective:  Patient is a 45 year old male with history of schizoaffective disorder, bipolar type presenting with worsening depression, suicidal ideations with plan to cut wrists. Patient had no acute events overnight, medication compliant, and attending to ADLs.  Patient seen during treatment team this morning, and again one-on-one in the office. He states he is feeling "blah" today, and fairly hopeless. He endorses feelings that he may as well die, but denies active plan or intent today. He notes that he has a birthday coming up, and it is causing him to think about a lot of things that make him feel like a failure. For example, having to live in a group home, not having a job, never being married, never having kids, not finishing school, etc. Supportive psychotherapy provided, and attempts made to shift his frame of mind onto things he has accomplished in life. He continues to state his goal is to get a new group home in [place], [place]. He feels that his current anxiety and depression cannot be resolved with medications and coping skills in his current group home environment. He requests that I speak with his guardian again today.   [name] contacted at [phone], no answer today. Did not leave message as voicemail left yesterday requesting return phone call.   Principal Problem: Schizoaffective disorder, bipolar type (HCC) Diagnosis: Principal Problem:   Schizoaffective disorder, bipolar type (HCC) Active Problems:   Hypertension   Diabetes mellitus without complication (HCC)   Tobacco use disorder  Total Time spent with patient: 45 minutes  Past Psychiatric History: See H&P. No new information gathered today.   Past Medical History:  Past Medical History:  Diagnosis Date  . Depression   . Diabetes mellitus without complication (HCC)   . Hyperlipidemia   . Hypertension   . Lupus  anticoagulant disorder (HCC) 06/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  . PE (pulmonary thromboembolism) (HCC)   . Schizo affective schizophrenia (HCC)   . Supratherapeutic INR 11/19/2016   History reviewed. No pertinent surgical history. Family History:  Family History  Problem Relation Age of Onset  . CAD Mother   . CAD Sister    Family Psychiatric  History: See H&P Social History:  Social History   Substance and Sexual Activity  Alcohol Use No   Comment: occassionally     Social History   Substance and Sexual Activity  Drug Use Not Currently  . Types: Marijuana   Comment: Last use 11/29/16    Social History   Socioeconomic History  . Marital status: Single    Spouse name: Not on file  . Number of children: Not on file  . Years of education: Not on file  . Highest education level: Not on file  Occupational History  . Not on file  Tobacco Use  . Smoking status: Current Every Day Smoker    Packs/day: 1.00    Years: 23.00    Pack years: 23.00    Types: Cigarettes  . Smokeless tobacco: Never Used  . Tobacco comment: will provide material  Substance and Sexual Activity  . Alcohol use: No    Comment: occassionally  . Drug use: Not Currently    Types: Marijuana    Comment: Last use 11/29/16  . Sexual activity: Never    Birth control/protection: None    Comment: occasional marijuana- none recently  Other  Topics Concern  . Not on file  Social History Narrative   [place]   Social Determinants of Health   Financial Resource Strain: Not on file  Food Insecurity: Not on file  Transportation Needs: Not on file  Physical Activity: Not on file  Stress: Not on file  Social Connections: Not on file   Additional Social History:                         Sleep: Fair  Appetite:  Fair  Current Medications: Current Facility-Administered Medications  Medication Dose Route Frequency Provider Last Rate Last Admin  .  acetaminophen (TYLENOL) tablet 650 mg  650 mg Oral Q6H PRN Clapacs, [name], MD      . albuterol (VENTOLIN HFA) 108 (90 Base) MCG/ACT inhaler 1-2 puff  1-2 puff Inhalation Q6H PRN Jesse SansFreeman, Herberto Ledwell M, MD      . alum & mag hydroxide-simeth (MAALOX/MYLANTA) 200-200-20 MG/5ML suspension 30 mL  30 mL Oral Q4H PRN Clapacs, Jackquline DenmarkJohn T, MD      . apixaban (ELIQUIS) tablet 5 mg  5 mg Oral BID Clapacs, Jackquline DenmarkJohn T, MD   5 mg at 07/17/20 0736  . cloZAPine (CLOZARIL) tablet 500 mg  500 mg Oral QHS Clapacs, Jackquline DenmarkJohn T, MD   500 mg at 07/16/20 2058  . fluticasone furoate-vilanterol (BREO ELLIPTA) 200-25 MCG/INH 1 puff  1 puff Inhalation Daily Jesse SansFreeman, Jourdain Guay M, MD   1 puff at 07/17/20 0737  . hydrOXYzine (ATARAX/VISTARIL) tablet 50 mg  50 mg Oral TID PRN Clapacs, Jackquline DenmarkJohn T, MD   50 mg at 07/16/20 2058  . influenza vac split quadrivalent PF (FLUARIX) injection 0.5 mL  0.5 mL Intramuscular Tomorrow-1000 Jesse SansFreeman, Taegan Haider M, MD      . insulin aspart (novoLOG) injection 0-15 Units  0-15 Units Subcutaneous Q4H Clapacs, Jackquline DenmarkJohn T, MD   2 Units at 07/17/20 [phone]  . lisinopril (ZESTRIL) tablet 2.5 mg  2.5 mg Oral Daily Clapacs, Jackquline DenmarkJohn T, MD   2.5 mg at 07/17/20 0736  . lithium carbonate (ESKALITH) CR tablet 450 mg  450 mg Oral Q12H Clapacs, Jackquline DenmarkJohn T, MD   450 mg at 07/17/20 0736  . magnesium hydroxide (MILK OF MAGNESIA) suspension 30 mL  30 mL Oral Daily PRN Clapacs, [name], MD      . metFORMIN (GLUCOPHAGE) tablet 1,000 mg  1,000 mg Oral BID WC Clapacs, [name], MD   1,000 mg at 07/17/20 0735  . nicotine polacrilex (NICORETTE) gum 2 mg  2 mg Oral Q4H PRN Jesse SansFreeman, Michalina Calbert M, MD   2 mg at 07/17/20 1115  . OLANZapine (ZYPREXA) tablet 10 mg  10 mg Oral BID Clapacs, Jackquline DenmarkJohn T, MD   10 mg at 07/17/20 0736  . sertraline (ZOLOFT) tablet 50 mg  50 mg Oral Daily Clapacs, Jackquline DenmarkJohn T, MD   50 mg at 07/17/20 0735  . simvastatin (ZOCOR) tablet 40 mg  40 mg Oral q1800 Clapacs, Jackquline DenmarkJohn T, MD   40 mg at 07/16/20 1703  . traZODone (DESYREL) tablet 100 mg  100 mg Oral QHS Clapacs,  Jackquline DenmarkJohn T, MD   100 mg at 07/16/20 2059  . traZODone (DESYREL) tablet 100 mg  100 mg Oral QHS PRN Clapacs, Jackquline DenmarkJohn T, MD        Lab Results:  Results for orders placed or performed during the hospital encounter of 07/15/20 (from the past 48 hour(s))  Glucose, capillary     Status: Abnormal  Collection Time: 07/15/20  7:58 PM  Result Value Ref Range   Glucose-Capillary 131 (H) 70 - 99 mg/dL    Comment: Glucose reference range applies only to samples taken after fasting for at least 8 hours.   Comment 1 Notify RN   Glucose, capillary     Status: Abnormal   Collection Time: 07/16/20  6:36 AM  Result Value Ref Range   Glucose-Capillary 121 (H) 70 - 99 mg/dL    Comment: Glucose reference range applies only to samples taken after fasting for at least 8 hours.  Hemoglobin A1c     Status: None   Collection Time: 07/16/20  9:09 AM  Result Value Ref Range   Hgb A1c MFr Bld 5.3 4.8 - 5.6 %    Comment: (NOTE) Pre diabetes:          5.7%-6.4%  Diabetes:              >6.4%  Glycemic control for   <7.0% adults with diabetes    Mean Plasma Glucose 105.41 mg/dL    Comment: Performed at [facility], [address], [place], [place] [address]  Lipid panel     Status: Abnormal   Collection Time: 07/16/20  9:09 AM  Result Value Ref Range   Cholesterol 135 0 - 200 mg/dL   Triglycerides 742 (H) <150 mg/dL   HDL 37 (L) >59 mg/dL   Total CHOL/HDL Ratio 3.6 RATIO   VLDL 45 (H) 0 - 40 mg/dL   LDL Cholesterol 53 0 - 99 mg/dL    Comment:        Total Cholesterol/HDL:CHD Risk Coronary Heart Disease Risk Table                     Men   Women  1/2 Average Risk   3.4   3.3  Average Risk       5.0   4.4  2 X Average Risk   9.6   7.1  3 X Average Risk  23.4   11.0        Use the calculated Patient Ratio above and the CHD Risk Table to determine the patient's CHD Risk.        ATP III CLASSIFICATION (LDL):  <100     mg/dL   Optimal  563-875  mg/dL   Near or Above                    Optimal   130-159  mg/dL   Borderline  643-329  mg/dL   High  >518     mg/dL   Very High Performed at [facility], [address] [place], [place] [address]   Glucose, capillary     Status: Abnormal   Collection Time: 07/16/20 11:21 AM  Result Value Ref Range   Glucose-Capillary 103 (H) 70 - 99 mg/dL    Comment: Glucose reference range applies only to samples taken after fasting for at least 8 hours.  Glucose, capillary     Status: Abnormal   Collection Time: 07/16/20  4:31 PM  Result Value Ref Range   Glucose-Capillary 141 (H) 70 - 99 mg/dL    Comment: Glucose reference range applies only to samples taken after fasting for at least 8 hours.  Glucose, capillary     Status: Abnormal   Collection Time: 07/16/20  8:57 PM  Result Value Ref Range   Glucose-Capillary 102 (H) 70 - 99 mg/dL    Comment:  Glucose reference range applies only to samples taken after fasting for at least 8 hours.  Glucose, capillary     Status: Abnormal   Collection Time: 07/17/20  7:00 AM  Result Value Ref Range   Glucose-Capillary 138 (H) 70 - 99 mg/dL    Comment: Glucose reference range applies only to samples taken after fasting for at least 8 hours.   Comment 1 Notify RN   Glucose, capillary     Status: None   Collection Time: 07/17/20 11:09 AM  Result Value Ref Range   Glucose-Capillary 86 70 - 99 mg/dL    Comment: Glucose reference range applies only to samples taken after fasting for at least 8 hours.    Blood Alcohol level:  Lab Results  Component Value Date   ETH <10 07/14/2020   ETH <10 01/04/2020    Metabolic Disorder Labs: Lab Results  Component Value Date   HGBA1C 5.3 07/16/2020   MPG 105.41 07/16/2020   MPG 105.41 07/14/2020   Lab Results  Component Value Date   PROLACTIN 2.0 (L) 08/31/2016   PROLACTIN 4.8 05/22/2016   Lab Results  Component Value Date   CHOL 135 07/16/2020   TRIG 226 (H) 07/16/2020   HDL 37 (L) 07/16/2020   CHOLHDL 3.6 07/16/2020   VLDL 45 (H) 07/16/2020    LDLCALC 53 07/16/2020   LDLCALC UNABLE TO CALCULATE IF TRIGLYCERIDE OVER 400 mg/dL 03/50/0938    Physical Findings: AIMS: Facial and Oral Movements Muscles of Facial Expression: None, normal Lips and Perioral Area: None, normal Jaw: None, normal Tongue: None, normal,Extremity Movements Upper (arms, wrists, hands, fingers): None, normal Lower (legs, knees, ankles, toes): None, normal, Trunk Movements Neck, shoulders, hips: None, normal, Overall Severity Severity of abnormal movements (highest score from questions above): None, normal Incapacitation due to abnormal movements: None, normal Patient's awareness of abnormal movements (rate only patient's report): No Awareness, Dental Status Current problems with teeth and/or dentures?: No Does patient usually wear dentures?: No  CIWA:    COWS:     Musculoskeletal: Strength & Muscle Tone: within normal limits Gait & Station: normal Patient leans: N/A  Psychiatric Specialty Exam: Physical Exam Vitals and nursing note reviewed.  Constitutional:      Appearance: Normal appearance.  HENT:     Head: Normocephalic and atraumatic.     Nose: Nose normal.     Mouth/Throat:     Mouth: Mucous membranes are moist.     Pharynx: Oropharynx is clear.  Eyes:     Extraocular Movements: Extraocular movements intact.     Conjunctiva/sclera: Conjunctivae normal.     Pupils: Pupils are equal, round, and reactive to light.  Cardiovascular:     Rate and Rhythm: Normal rate.     Pulses: Normal pulses.  Pulmonary:     Effort: Pulmonary effort is normal.     Breath sounds: Normal breath sounds.  Abdominal:     General: Abdomen is flat.     Palpations: Abdomen is soft.  Musculoskeletal:        General: No swelling. Normal range of motion.     Cervical back: Normal range of motion and neck supple.  Skin:    General: Skin is warm and dry.  Neurological:     General: No focal deficit present.     Mental Status: He is alert and oriented to  person, place, and time.  Psychiatric:        Attention and Perception: Attention normal. He perceives auditory hallucinations.  Mood and Affect: Mood is depressed. Affect is flat.        Speech: Speech normal.        Behavior: Behavior is cooperative.        Thought Content: Thought content includes suicidal ideation. Thought content does not include homicidal ideation. Thought content includes suicidal plan.        Cognition and Memory: Cognition and memory normal.        Judgment: Judgment is impulsive.     Review of Systems  Constitutional: Positive for fatigue. Negative for appetite change.  HENT: Negative for rhinorrhea and sore throat.   Eyes: Negative for photophobia and visual disturbance.  Respiratory: Positive for shortness of breath. Negative for cough and wheezing.   Cardiovascular: Negative for chest pain and palpitations.  Gastrointestinal: Negative for constipation, diarrhea, nausea and vomiting.  Endocrine: Negative for cold intolerance and heat intolerance.  Genitourinary: Negative for difficulty urinating and dysuria.  Musculoskeletal: Negative for arthralgias and myalgias.  Skin: Negative for rash and wound.  Allergic/Immunologic: Negative for food allergies and immunocompromised state.  Neurological: Negative for dizziness and headaches.  Hematological: Negative for adenopathy. Does not bruise/bleed easily.  Psychiatric/Behavioral: Positive for hallucinations and suicidal ideas. The patient is nervous/anxious.     Blood pressure 119/72, pulse 74, temperature 98 F (36.7 C), temperature source Oral, resp. rate 17, height 5' 10.5" (1.791 m), weight 81.2 kg, SpO2 100 %.Body mass index is 25.32 kg/m.  General Appearance: Casual  Eye Contact:  Good  Speech:  Clear and Coherent and Normal Rate  Volume:  Normal  Mood:  Depressed and Dysphoric  Affect:  Congruent and Constricted  Thought Process:  Coherent and Linear  Orientation:  Full (Time, Place, and  Person)  Thought Content:  Hallucinations: Auditory and Rumination on finding new group home  Suicidal Thoughts:  Yes.  without intent/plan  Homicidal Thoughts:  No  Memory:  Immediate;   Fair Recent;   Fair Remote;   Fair  Judgement:  Intact  Insight:  Present  Psychomotor Activity:  Normal  Concentration:  Concentration: Fair and Attention Span: Fair  Recall:  [employer] of Knowledge:  Fair  Language:  Fair  Akathisia:  Negative  Handed:  Right  AIMS (if indicated):     Assets:  Communication Skills Desire for Improvement Financial Resources/Insurance Housing Physical Health Resilience  ADL's:  Intact  Cognition:  WNL  Sleep:  Number of Hours: 8     Treatment Plan Summary: Daily contact with patient to assess and evaluate symptoms and progress in treatment and Medication management Patient is a 45 year old male with history of schizoaffective disorder, bipolar type presenting with worsening depression, suicidal ideations with plan to cut wrists. On exam today patient continues to report suicidal ideations, but denies plan or intent. Continues to endorse auditory hallucinations. Continue medications as above.   [name], MD 07/17/2020, 12:43 PM

## 2020-07-17 NOTE — Progress Notes (Signed)
Cooperative with treatment, medication compliant, he endorses depression and he denies suicidal ideation. No behavioral issues to report on shift at this time. Patient seems to be in bed quietly at this time.

## 2020-07-17 NOTE — Progress Notes (Signed)
Recreation Therapy Notes   Date: 07/17/2020  Time: 9:30 am   Location: Craft room   Behavioral response: Appropriate  Intervention Topic: Goals   Discussion/Intervention:  Group content on today was focused on goals. Patients described what goals are and how they define goals. Individuals expressed how they go about setting goals and reaching them. The group identified how important goals are and if they make short term goals to reach long term goals. Patients described how many goals they work on at a time and what affects them not reaching their goal. Individuals described how much time they put into planning and obtaining their goals. The group participated in the intervention "My Goal Board" and made personal goal boards to help them achieve their goal. Clinical Observations/Feedback: Patient came to group and defined goals as trying to prepare for something. He explained that his goal is to find inner strength to cope life.  Individual was social with staff while participating in the intervention. Lakyia Behe LRT/CTRS         Gearline Spilman 07/17/2020 2:14 PM

## 2020-07-18 DIAGNOSIS — F250 Schizoaffective disorder, bipolar type: Secondary | ICD-10-CM | POA: Diagnosis not present

## 2020-07-18 LAB — GLUCOSE, CAPILLARY
Glucose-Capillary: 137 mg/dL — ABNORMAL HIGH (ref 70–99)
Glucose-Capillary: 69 mg/dL — ABNORMAL LOW (ref 70–99)
Glucose-Capillary: 106 mg/dL — ABNORMAL HIGH (ref 70–99)
Glucose-Capillary: 112 mg/dL — ABNORMAL HIGH (ref 70–99)

## 2020-07-18 MED ORDER — INSULIN ASPART 100 UNIT/ML ~~LOC~~ SOLN: 0.0000 [IU] | Freq: Three times a day (TID) | SUBCUTANEOUS | Status: DC

## 2020-07-18 MED ORDER — INSULIN ASPART 100 UNIT/ML ~~LOC~~ SOLN: 0.0000 [IU] | Freq: Every day | SUBCUTANEOUS | Status: DC

## 2020-07-18 NOTE — Progress Notes (Signed)
   07/18/20 1400  Clinical Encounter Type  Visited With Other (Comment) (Spirituality Group)   Mr. Crill attended and participated in Spirituality Group today.

## 2020-07-18 NOTE — Care Management Important Message (Signed)
Important Message  Patient Details  Name: Lance [name] MRN: [id number] Date of Birth: Jan 21, 1976   Medicare Important Message Given:  Other (see comment) (Reviewed with [name] (guardian [phone]) and emailed for signature.)     Glenis Smoker, LCSW 07/18/2020, 9:09 AM

## 2020-07-18 NOTE — Progress Notes (Signed)
Recreation Therapy Notes  Date: 07/18/2020  Time: 9:30 am   Location: Craft room   Behavioral response: Appropriate  Intervention Topic: Social skills   Discussion/Intervention:  Group content on today was focused on social skills. The group defined social skills and identified ways they use social skills. Patients expressed what obstacles they face when trying to be social. Participants described the importance of social skills. The group listed ways to improve social skills and reasons to improve social skills. Individuals had an opportunity to learn new and improve social skills as well as identify their weaknesses. Clinical Observations/Feedback: Patient came to group and defined social skills as learning how to communicate with others. He stated no one can read your mind so communication is important to express things. Individual was social with staff while participating in the intervention. Participant left group early due to unknown reasons and did not return.  Mikle Sternberg LRT/CTRS         Akin Yi 07/18/2020 12:36 PM

## 2020-07-18 NOTE — [facility] Group Notes (Signed)
LCSW Group Therapy Note  07/18/2020 2:15 PM  Type of Therapy and Topic:  Group Therapy:  Feelings around Relapse and Recovery  Participation Level:  Did Not Attend   Description of Group:    Patients in this group will discuss emotions they experience before and after a relapse. They will process how experiencing these feelings, or avoidance of experiencing them, relates to having a relapse. Facilitator will guide patients to explore emotions they have related to recovery. Patients will be encouraged to process which emotions are more powerful. They will be guided to discuss the emotional reaction significant others in their lives may have to their relapse or recovery. Patients will be assisted in exploring ways to respond to the emotions of others without this contributing to a relapse.  Therapeutic Goals: 1. Patient will identify two or more emotions that lead to a relapse for them 2. Patient will identify two emotions that result when they relapse 3. Patient will identify two emotions related to recovery 4. Patient will demonstrate ability to communicate their needs through discussion and/or role plays   Summary of Patient Progress: Patient did not attend group despite encouraged participation.    Therapeutic Modalities:   Cognitive Behavioral Therapy Solution-Focused Therapy Assertiveness Training Relapse Prevention Therapy   Gwenevere Ghazi, MSW, [place], [place] 07/18/2020 2:15 PM

## 2020-07-18 NOTE — Progress Notes (Signed)
BRIEF PHARMACY NOTE   This patient attended and participated in Medication Management Group counseling led by [facility] staff pharmacist.  This interactive class reviews basic information about prescription medications and education on personal responsibility in medication management.  The class also includes general knowledge of 3 main classes of behavioral medications, including antipsychotics, antidepressants, and mood stabilizers.     Patient behavior was appropriate for group setting.   Educational materials sourced from:  "Medication Do's and Don'ts" from WWW.MED-PASS.COM   "Mental Health Medications" from National Institute of Mental Health Https://www.nimh.nih.gov/health/topics/mental-health-medications/index.shtml#part 149856    Junko Ohagan M Letty Salvi, PharmD, BCPS Clinical Pharmacist 07/18/2020 3:06 PM  

## 2020-07-18 NOTE — Progress Notes (Signed)
   07/18/20 1350  Clinical Encounter Type  Visited With Patient  Visit Type Initial;Spiritual support;Social support  Referral From Chaplain  Consult/Referral To Chaplain  Suszanne Finch and Ch Melanie Provide Ch group and Pt attended. Ch will follow up with Pt.

## 2020-07-18 NOTE — Progress Notes (Signed)
[facility] MD Progress Note  07/18/2020 12:43 PM Lance [name]  MRN:  [id number]  CC: "Feeling anxious this morning"  Subjective:  Patient is a 45 year old male with history of schizoaffective disorder, bipolar type presenting with worsening depression, suicidal ideations with plan to cut wrists. Patient had no acute events overnight, medication compliant, and attending to ADLs.  Patient seen one-on-one in the office. He states he is feeling anxious this morning, and is unsure why. He continues to ruminate on wanting to change group homes to live in [place] again so he can go to the [employer]. He requests that I call his guardian again today to inquire about changing homes. He continues to feel dread about facing his peers in the group home, and being unable to cope with their bullying. He does note that his depression and hallucinations are improving since restarting Clozapine. He continues to have feelings of being better off dead, but denies active suicidal thoughts or intent.   Contacted [facility] group home [phone]. They state they will be able to accept Cabe back to the group home on Monday. They use [facility], and report he has refills on all medications aside from Clozapine. She has no questions or concerns at this time.   Contacted [name], legal guardian, [phone]. She approves of Tyjae Issa returning to his group home on Monday. She is also aware of his desire to change group homes and live in [facility]. She plans to work with [name] ACT team to find new placement. She requests that I pass this information about the search on to Mr. Mcgue. She has no other questions or concerns.    Principal Problem: Schizoaffective disorder, bipolar type (HCC) Diagnosis: Principal Problem:   Schizoaffective disorder, bipolar type (HCC) Active Problems:   Hypertension   Diabetes mellitus without complication (HCC)   Tobacco use disorder  Total Time  spent with patient: 45 minutes  Past Psychiatric History: See H&P Past Medical History:  Past Medical History:  Diagnosis Date  . Depression   . Diabetes mellitus without complication (HCC)   . Hyperlipidemia   . Hypertension   . Lupus anticoagulant disorder (HCC) 06/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  . PE (pulmonary thromboembolism) (HCC)   . Schizo affective schizophrenia (HCC)   . Supratherapeutic INR 11/19/2016   History reviewed. No pertinent surgical history. Family History:  Family History  Problem Relation Age of Onset  . CAD Mother   . CAD Sister    Family Psychiatric  History: See H&P Social History:  Social History   Substance and Sexual Activity  Alcohol Use No   Comment: occassionally     Social History   Substance and Sexual Activity  Drug Use Not Currently  . Types: Marijuana   Comment: Last use 11/29/16    Social History   Socioeconomic History  . Marital status: Single    Spouse name: Not on file  . Number of children: Not on file  . Years of education: Not on file  . Highest education level: Not on file  Occupational History  . Not on file  Tobacco Use  . Smoking status: Current Every Day Smoker    Packs/day: 1.00    Years: 23.00    Pack years: 23.00    Types: Cigarettes  . Smokeless tobacco: Never Used  . Tobacco comment: will provide material  Substance and Sexual Activity  . Alcohol use: No  Comment: occassionally  . Drug use: Not Currently    Types: Marijuana    Comment: Last use 11/29/16  . Sexual activity: Never    Birth control/protection: None    Comment: occasional marijuana- none recently  Other Topics Concern  . Not on file  Social History Narrative   From a group home in [place]   Social Determinants of Health   Financial Resource Strain: Not on file  Food Insecurity: Not on file  Transportation Needs: Not on file  Physical Activity: Not on file  Stress: Not on file  Social Connections: Not on  file   Additional Social History:                         Sleep: Fair  Appetite:  Fair  Current Medications: Current Facility-Administered Medications  Medication Dose Route Frequency Provider Last Rate Last Admin  . acetaminophen (TYLENOL) tablet 650 mg  650 mg Oral Q6H PRN Clapacs, [name], MD      . albuterol (VENTOLIN HFA) 108 (90 Base) MCG/ACT inhaler 1-2 puff  1-2 puff Inhalation Q6H PRN [name], MD      . alum & mag hydroxide-simeth (MAALOX/MYLANTA) 200-200-20 MG/5ML suspension 30 mL  30 mL Oral Q4H PRN Clapacs, [name], MD      . apixaban (ELIQUIS) tablet 5 mg  5 mg Oral BID Clapacs, [name], MD   5 mg at 07/18/20 0736  . cloZAPine (CLOZARIL) tablet 500 mg  500 mg Oral QHS Clapacs, [name], MD   500 mg at 07/17/20 2108  . fluticasone furoate-vilanterol (BREO ELLIPTA) 200-25 MCG/INH 1 puff  1 puff Inhalation Daily [name], MD   1 puff at 07/17/20 0737  . hydrOXYzine (ATARAX/VISTARIL) tablet 50 mg  50 mg Oral TID PRN Clapacs, [name], MD   50 mg at 07/18/20 0820  . insulin aspart (novoLOG) injection 0-15 Units  0-15 Units Subcutaneous Q4H Clapacs, [name], MD   2 Units at 07/18/20 0825  . lisinopril (ZESTRIL) tablet 2.5 mg  2.5 mg Oral Daily Clapacs, [name], MD   2.5 mg at 07/18/20 2355  . lithium carbonate (ESKALITH) CR tablet 450 mg  450 mg Oral Q12H Clapacs, [name], MD   450 mg at 07/18/20 0820  . magnesium hydroxide (MILK OF MAGNESIA) suspension 30 mL  30 mL Oral Daily PRN Clapacs, [name], MD      . metFORMIN (GLUCOPHAGE) tablet 1,000 mg  1,000 mg Oral BID WC Clapacs, [name], MD   1,000 mg at 07/18/20 0820  . nicotine polacrilex (NICORETTE) gum 2 mg  2 mg Oral Q4H PRN [name], MD   2 mg at 07/17/20 1115  . OLANZapine (ZYPREXA) tablet 10 mg  10 mg Oral BID Clapacs, [name], MD   10 mg at 07/18/20 0820  . sertraline (ZOLOFT) tablet 50 mg  50 mg Oral Daily Clapacs, [name], MD   50 mg at 07/18/20 0820  . simvastatin (ZOCOR) tablet 40 mg  40 mg Oral q1800  Clapacs, [name], MD   40 mg at 07/17/20 1706  . traZODone (DESYREL) tablet 100 mg  100 mg Oral QHS Clapacs, [name], MD   100 mg at 07/17/20 2111  . traZODone (DESYREL) tablet 100 mg  100 mg Oral QHS PRN Clapacs, [name], MD   100 mg at 07/17/20 2110    Lab Results:  Results for orders placed or performed during the  hospital encounter of 07/15/20 (from the past 48 hour(s))  Glucose, capillary     Status: Abnormal   Collection Time: 07/16/20  4:31 PM  Result Value Ref Range   Glucose-Capillary 141 (H) 70 - 99 mg/dL    Comment: Glucose reference range applies only to samples taken after fasting for at least 8 hours.  Glucose, capillary     Status: Abnormal   Collection Time: 07/16/20  8:57 PM  Result Value Ref Range   Glucose-Capillary 102 (H) 70 - 99 mg/dL    Comment: Glucose reference range applies only to samples taken after fasting for at least 8 hours.  Glucose, capillary     Status: Abnormal   Collection Time: 07/17/20  7:00 AM  Result Value Ref Range   Glucose-Capillary 138 (H) 70 - 99 mg/dL    Comment: Glucose reference range applies only to samples taken after fasting for at least 8 hours.   Comment 1 Notify RN   Glucose, capillary     Status: None   Collection Time: 07/17/20 11:09 AM  Result Value Ref Range   Glucose-Capillary 86 70 - 99 mg/dL    Comment: Glucose reference range applies only to samples taken after fasting for at least 8 hours.  Glucose, capillary     Status: None   Collection Time: 07/17/20  4:09 PM  Result Value Ref Range   Glucose-Capillary 97 70 - 99 mg/dL    Comment: Glucose reference range applies only to samples taken after fasting for at least 8 hours.  Glucose, capillary     Status: None   Collection Time: 07/17/20  8:15 PM  Result Value Ref Range   Glucose-Capillary 80 70 - 99 mg/dL    Comment: Glucose reference range applies only to samples taken after fasting for at least 8 hours.  Glucose, capillary     Status: Abnormal   Collection Time:  07/18/20  7:29 AM  Result Value Ref Range   Glucose-Capillary 137 (H) 70 - 99 mg/dL    Comment: Glucose reference range applies only to samples taken after fasting for at least 8 hours.  Glucose, capillary     Status: Abnormal   Collection Time: 07/18/20 11:20 AM  Result Value Ref Range   Glucose-Capillary 69 (L) 70 - 99 mg/dL    Comment: Glucose reference range applies only to samples taken after fasting for at least 8 hours.    Blood Alcohol level:  Lab Results  Component Value Date   ETH <10 07/14/2020   ETH <10 01/04/2020    Metabolic Disorder Labs: Lab Results  Component Value Date   HGBA1C 5.3 07/16/2020   MPG 105.41 07/16/2020   MPG 105.41 07/14/2020   Lab Results  Component Value Date   PROLACTIN 2.0 (L) 08/31/2016   PROLACTIN 4.8 05/22/2016   Lab Results  Component Value Date   CHOL 135 07/16/2020   TRIG 226 (H) 07/16/2020   HDL 37 (L) 07/16/2020   CHOLHDL 3.6 07/16/2020   VLDL 45 (H) 07/16/2020   LDLCALC 53 07/16/2020   LDLCALC UNABLE TO CALCULATE IF TRIGLYCERIDE OVER 400 mg/dL 16/10/960408/09/2017    Physical Findings: AIMS: Facial and Oral Movements Muscles of Facial Expression: None, normal Lips and Perioral Area: None, normal Jaw: None, normal Tongue: None, normal,Extremity Movements Upper (arms, wrists, hands, fingers): None, normal Lower (legs, knees, ankles, toes): None, normal, Trunk Movements Neck, shoulders, hips: None, normal, Overall Severity Severity of abnormal movements (highest score from questions above): None, normal Incapacitation due to  abnormal movements: None, normal Patient's awareness of abnormal movements (rate only patient's report): No Awareness, Dental Status Current problems with teeth and/or dentures?: No Does patient usually wear dentures?: No  CIWA:    COWS:     Musculoskeletal: Strength & Muscle Tone: within normal limits Gait & Station: normal Patient leans: N/A  Psychiatric Specialty Exam: Physical Exam Vitals and  nursing note reviewed.  Constitutional:      Appearance: Normal appearance.  HENT:     Head: Normocephalic and atraumatic.     Nose: Nose normal.     Mouth/Throat:     Mouth: Mucous membranes are moist.     Pharynx: Oropharynx is clear.  Eyes:     Extraocular Movements: Extraocular movements intact.     Conjunctiva/sclera: Conjunctivae normal.     Pupils: Pupils are equal, round, and reactive to light.  Cardiovascular:     Rate and Rhythm: Normal rate.     Pulses: Normal pulses.  Pulmonary:     Effort: Pulmonary effort is normal.     Breath sounds: Normal breath sounds.  Abdominal:     General: Abdomen is flat.     Palpations: Abdomen is soft.  Musculoskeletal:        General: No swelling. Normal range of motion.     Cervical back: Normal range of motion and neck supple.  Skin:    General: Skin is warm and dry.  Neurological:     General: No focal deficit present.     Mental Status: He is alert and oriented to person, place, and time.  Psychiatric:        Attention and Perception: Attention normal. He perceives auditory hallucinations.        Mood and Affect: Mood is depressed. Affect is flat.        Speech: Speech normal.        Behavior: Behavior is cooperative.        Thought Content: Thought content includes suicidal ideation. Thought content does not include homicidal ideation. Thought content does not include suicidal plan.        Cognition and Memory: Cognition and memory normal.        Judgment: Judgment is impulsive.     Review of Systems  Constitutional: Negative for appetite change and fatigue.  HENT: Negative for rhinorrhea and sore throat.   Eyes: Negative for photophobia and visual disturbance.  Respiratory: Negative for cough, shortness of breath and wheezing.   Cardiovascular: Negative for chest pain and palpitations.  Gastrointestinal: Negative for constipation, diarrhea, nausea and vomiting.  Endocrine: Negative for cold intolerance and heat  intolerance.  Genitourinary: Negative for difficulty urinating and dysuria.  Musculoskeletal: Negative for arthralgias and myalgias.  Skin: Negative for rash and wound.  Allergic/Immunologic: Negative for food allergies and immunocompromised state.  Neurological: Negative for dizziness and headaches.  Hematological: Negative for adenopathy. Does not bruise/bleed easily.  Psychiatric/Behavioral: Positive for dysphoric mood, hallucinations and suicidal ideas. The patient is nervous/anxious.     Blood pressure 112/68, pulse 78, temperature 97.6 F (36.4 C), temperature source Oral, resp. rate 16, height 5' 10.5" (1.791 m), weight 81.2 kg, SpO2 99 %.Body mass index is 25.32 kg/m.  General Appearance: Casual  Eye Contact:  Good  Speech:  Clear and Coherent and Normal Rate  Volume:  Normal  Mood:  Depressed and Dysphoric  Affect:  Congruent and Constricted  Thought Process:  Coherent and Linear  Orientation:  Full (Time, Place, and Person)  Thought Content:  Hallucinations: Auditory  and Rumination on finding new group home  Suicidal Thoughts:  Yes.  without intent/plan  Homicidal Thoughts:  No  Memory:  Immediate;   Fair Recent;   Fair Remote;   Fair  Judgement:  Intact  Insight:  Present  Psychomotor Activity:  Normal  Concentration:  Concentration: Fair and Attention Span: Fair  Recall:  [employer] of Knowledge:  Fair  Language:  Fair  Akathisia:  Negative  Handed:  Right  AIMS (if indicated):     Assets:  Communication Skills Desire for Improvement Financial Resources/Insurance Housing Physical Health Resilience  ADL's:  Intact  Cognition:  WNL  Sleep:  Number of Hours: 8     Treatment Plan Summary: Daily contact with patient to assess and evaluate symptoms and progress in treatment and Medication management Patient is a 45 year old male with history of schizoaffective disorder, bipolar type presenting with worsening depression, suicidal ideations with plan to cut  wrists. On exam today patient continues to report suicidal ideations, but denies plan or intent. Continues to endorse auditory hallucinations. Continue medications as above.   [name], MD 07/18/2020, 12:43 PM

## 2020-07-18 NOTE — Progress Notes (Signed)
   07/18/20 1000  Psych Admission Type (Psych Patients Only)  Admission Status Voluntary  Psychosocial Assessment  Patient Complaints None  Eye Contact Fair  Facial Expression Sullen  Affect Appropriate to circumstance  Speech Logical/coherent  Interaction Assertive  Motor Activity Slow  Appearance/Hygiene Improved  Behavior Characteristics Cooperative  Mood Pleasant  Aggressive Behavior  Effect No apparent injury  Thought Process  Coherency WDL  Content WDL  Delusions None reported or observed  Perception WDL  Hallucination Visual  Judgment WDL  Confusion WDL  Danger to Self  Current suicidal ideation? Denies  Self-Injurious Behavior No self-injurious ideation or behavior indicators observed or expressed   Danger to Others  Danger to Others None reported or observed  Patient compliant with medication and programming Denies SI/HI/A/VH. Interacted well with peers and staff. Support and encouragement provided as needed. Q 15 minutes safety checks ongoing without self harm gestures.

## 2020-07-18 NOTE — [facility] Counselor (Signed)
CSW spoke with the patient's group home [facility],  [phone].  CSW confirmed that patient could return home.  Patient is scheduled to be picked up at 11 on 07/22/2020.  FL2 will need to be faxed to [phone].  Penni Homans, MSW, LCSW 07/18/2020 1:34 PM

## 2020-07-18 NOTE — Progress Notes (Signed)
Inpatient Diabetes Program Recommendations  AACE/ADA: New Consensus Statement on Inpatient Glycemic Control (2015)  Target Ranges:  Prepandial:   less than 140 mg/dL      Peak postprandial:   less than 180 mg/dL (1-2 hours)      Critically ill patients:  140 - 180 mg/dL   Lab Results  Component Value Date   GLUCAP 69 (L) 07/18/2020   HGBA1C 5.3 07/16/2020    Review of Glycemic Control Results for Lance [name], Lance [name] (MRN [id number]) as of 07/18/2020 15:27  Ref. Range 07/17/2020 11:09 07/17/2020 16:09 07/17/2020 20:15 07/18/2020 07:29 07/18/2020 11:20  Glucose-Capillary Latest Ref Range: 70 - 99 mg/dL 86 97 80 638 (H) 69 (L)   Diabetes history: DM2 Outpatient Diabetes medications: Metformin 1 gm bid  Current orders for Inpatient glycemic control: Metformin 1 gm bid + Novolog 0-15 units q 4 hrs.  Inpatient Diabetes Program Recommendations: Since patient is eating:   Decrease Novolog correction to tid + hs -5 units Secure chat sent to Dr. Neale Burly.  Thank you, Billy Fischer. Jericho Alcorn, RN, MSN, CDE  Diabetes Coordinator Inpatient Glycemic Control Team Team Pager [phone] (8am-5pm) 07/18/2020 3:30 PM

## 2020-07-18 NOTE — Progress Notes (Signed)
Patient alert and oriented x 4, affect is blunted thoughts are organized and coherent, he denies SI/HI/AVH no bizarre behaviour noted, he is interacting appropriately with peers and staff and complaint with medication regimen., 15 minutes safety checks maintained will continue to monitor.

## 2020-07-19 DIAGNOSIS — F250 Schizoaffective disorder, bipolar type: Secondary | ICD-10-CM | POA: Diagnosis not present

## 2020-07-19 LAB — GLUCOSE, CAPILLARY
Glucose-Capillary: 108 mg/dL — ABNORMAL HIGH (ref 70–99)
Glucose-Capillary: 50 mg/dL — ABNORMAL LOW (ref 70–99)
Glucose-Capillary: 125 mg/dL — ABNORMAL HIGH (ref 70–99)
Glucose-Capillary: 98 mg/dL (ref 70–99)

## 2020-07-19 NOTE — Progress Notes (Signed)
Recreation Therapy Notes  Date: 07/19/2020  Time: 9:30 am   Location: Craft room     Behavioral response: N/A   Intervention Topic: Teamwork   Discussion/Intervention: Patient did not attend group.   Clinical Observations/Feedback:  Patient did not attend group.   Jaileigh Weimer LRT/CTRS        Mckensi Redinger 07/19/2020 11:20 AM

## 2020-07-19 NOTE — Progress Notes (Signed)
Patients blood sugar 50 at lunch time.No hypoglycemic signs verbalized or noted.Patient took orange juice and lunch.Blood sugar rechecked and is 125.

## 2020-07-19 NOTE — Progress Notes (Addendum)
[facility] MD Progress Note  07/19/2020 1:59 PM Lance [name]  MRN:  [id number]  CC: "I think I'm getting better."  Subjective:  Patient is a 45 year old male with hschizoaffective disorder, bipolar type presenting with worsening depression, suicidal ideations with plan to cut wrists. Patient had no acute events overnight, medication compliant, and attending to ADLs.  Patient seen one-on-one this morning. He notes that he is starting to feel a little better. He continues to endorses depression and anxiety, but feels he is learning some better coping skills from staff. He no longer endorses suicidal ideations or hallucinations. He also denies homicidal ideations. No side effects to current medications. He continues to be fixated on moving group homes to [place], [place]. Reminded him that team has spoken with his guardian, and she is aware. Guardian plans to work with [name] to find new placement closer to [facility].   Principal Problem: Schizoaffective disorder, bipolar type (HCC) Diagnosis: Principal Problem:   Schizoaffective disorder, bipolar type (HCC) Active Problems:   Hypertension   Diabetes mellitus without complication (HCC)   Hyperlipidemia   History of pulmonary embolism   Tobacco use disorder  Total Time spent with patient: 30 minutes  Past Psychiatric History: See H&P Past Medical History:  Past Medical History:  Diagnosis Date  . Depression   . Diabetes mellitus without complication (HCC)   . Hyperlipidemia   . Hypertension   . Lupus anticoagulant disorder (HCC) 06/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  . PE (pulmonary thromboembolism) (HCC)   . Schizo affective schizophrenia (HCC)   . Supratherapeutic INR 11/19/2016   History reviewed. No pertinent surgical history. Family History:  Family History  Problem Relation Age of Onset  . CAD Mother   . CAD Sister    Family Psychiatric  History: See H&P Social History:  Social History   Substance  and Sexual Activity  Alcohol Use No   Comment: occassionally     Social History   Substance and Sexual Activity  Drug Use Not Currently  . Types: Marijuana   Comment: Last use 11/29/16    Social History   Socioeconomic History  . Marital status: Single    Spouse name: Not on file  . Number of children: Not on file  . Years of education: Not on file  . Highest education level: Not on file  Occupational History  . Not on file  Tobacco Use  . Smoking status: Current Every Day Smoker    Packs/day: 1.00    Years: 23.00    Pack years: 23.00    Types: Cigarettes  . Smokeless tobacco: Never Used  . Tobacco comment: will provide material  Substance and Sexual Activity  . Alcohol use: No    Comment: occassionally  . Drug use: Not Currently    Types: Marijuana    Comment: Last use 11/29/16  . Sexual activity: Never    Birth control/protection: None    Comment: occasional marijuana- none recently  Other Topics Concern  . Not on file  Social History Narrative   From a group home in [place]   Social Determinants of Health   Financial Resource Strain: Not on file  Food Insecurity: Not on file  Transportation Needs: Not on file  Physical Activity: Not on file  Stress: Not on file  Social Connections: Not on file   Additional Social History:  Sleep: Fair  Appetite:  Fair  Current Medications: Current Facility-Administered Medications  Medication Dose Route Frequency Provider Last Rate Last Admin  . acetaminophen (TYLENOL) tablet 650 mg  650 mg Oral Q6H PRN Clapacs, [name], MD      . albuterol (VENTOLIN HFA) 108 (90 Base) MCG/ACT inhaler 1-2 puff  1-2 puff Inhalation Q6H PRN [name], MD      . alum & mag hydroxide-simeth (MAALOX/MYLANTA) 200-200-20 MG/5ML suspension 30 mL  30 mL Oral Q4H PRN Clapacs, [name], MD      . apixaban (ELIQUIS) tablet 5 mg  5 mg Oral BID Clapacs, [name], MD   5 mg at 07/19/20 0757  . cloZAPine  (CLOZARIL) tablet 500 mg  500 mg Oral QHS Clapacs, [name], MD   500 mg at 07/18/20 2107  . fluticasone furoate-vilanterol (BREO ELLIPTA) 200-25 MCG/INH 1 puff  1 puff Inhalation Daily [name], MD   1 puff at 07/19/20 0758  . hydrOXYzine (ATARAX/VISTARIL) tablet 50 mg  50 mg Oral TID PRN Clapacs, [name], MD   50 mg at 07/18/20 0820  . lisinopril (ZESTRIL) tablet 2.5 mg  2.5 mg Oral Daily Clapacs, [name], MD   2.5 mg at 07/19/20 0757  . lithium carbonate (ESKALITH) CR tablet 450 mg  450 mg Oral Q12H Clapacs, [name], MD   450 mg at 07/19/20 0756  . magnesium hydroxide (MILK OF MAGNESIA) suspension 30 mL  30 mL Oral Daily PRN Clapacs, [name], MD      . metFORMIN (GLUCOPHAGE) tablet 1,000 mg  1,000 mg Oral BID WC Clapacs, [name], MD   1,000 mg at 07/19/20 0756  . nicotine polacrilex (NICORETTE) gum 2 mg  2 mg Oral Q4H PRN [name], MD   2 mg at 07/17/20 1115  . OLANZapine (ZYPREXA) tablet 10 mg  10 mg Oral BID Clapacs, [name], MD   10 mg at 07/19/20 0756  . sertraline (ZOLOFT) tablet 50 mg  50 mg Oral Daily Clapacs, [name], MD   50 mg at 07/19/20 0756  . simvastatin (ZOCOR) tablet 40 mg  40 mg Oral q1800 Clapacs, [name], MD   40 mg at 07/18/20 1719  . traZODone (DESYREL) tablet 100 mg  100 mg Oral QHS Clapacs, [name], MD   100 mg at 07/18/20 2107  . traZODone (DESYREL) tablet 100 mg  100 mg Oral QHS PRN Clapacs, [name], MD   100 mg at 07/17/20 2110    Lab Results:  Results for orders placed or performed during the hospital encounter of 07/15/20 (from the past 48 hour(s))  Glucose, capillary     Status: None   Collection Time: 07/17/20  4:09 PM  Result Value Ref Range   Glucose-Capillary 97 70 - 99 mg/dL    Comment: Glucose reference range applies only to samples taken after fasting for at least 8 hours.  Glucose, capillary     Status: None   Collection Time: 07/17/20  8:15 PM  Result Value Ref Range   Glucose-Capillary 80 70 - 99 mg/dL    Comment: Glucose reference range applies  only to samples taken after fasting for at least 8 hours.  Glucose, capillary     Status: Abnormal   Collection Time: 07/18/20  7:29 AM  Result Value Ref Range   Glucose-Capillary 137 (H) 70 - 99 mg/dL    Comment: Glucose reference range applies only to samples taken after fasting for at least 8  hours.  Glucose, capillary     Status: Abnormal   Collection Time: 07/18/20 11:20 AM  Result Value Ref Range   Glucose-Capillary 69 (L) 70 - 99 mg/dL    Comment: Glucose reference range applies only to samples taken after fasting for at least 8 hours.  Glucose, capillary     Status: Abnormal   Collection Time: 07/18/20  4:05 PM  Result Value Ref Range   Glucose-Capillary 106 (H) 70 - 99 mg/dL    Comment: Glucose reference range applies only to samples taken after fasting for at least 8 hours.  Glucose, capillary     Status: Abnormal   Collection Time: 07/18/20  8:44 PM  Result Value Ref Range   Glucose-Capillary 112 (H) 70 - 99 mg/dL    Comment: Glucose reference range applies only to samples taken after fasting for at least 8 hours.  Glucose, capillary     Status: Abnormal   Collection Time: 07/19/20  7:43 AM  Result Value Ref Range   Glucose-Capillary 108 (H) 70 - 99 mg/dL    Comment: Glucose reference range applies only to samples taken after fasting for at least 8 hours.  Glucose, capillary     Status: Abnormal   Collection Time: 07/19/20 11:19 AM  Result Value Ref Range   Glucose-Capillary 50 (L) 70 - 99 mg/dL    Comment: Glucose reference range applies only to samples taken after fasting for at least 8 hours.  Glucose, capillary     Status: Abnormal   Collection Time: 07/19/20 11:45 AM  Result Value Ref Range   Glucose-Capillary 125 (H) 70 - 99 mg/dL    Comment: Glucose reference range applies only to samples taken after fasting for at least 8 hours.    Blood Alcohol level:  Lab Results  Component Value Date   ETH <10 07/14/2020   ETH <10 01/04/2020    Metabolic Disorder  Labs: Lab Results  Component Value Date   HGBA1C 5.3 07/16/2020   MPG 105.41 07/16/2020   MPG 105.41 07/14/2020   Lab Results  Component Value Date   PROLACTIN 2.0 (L) 08/31/2016   PROLACTIN 4.8 05/22/2016   Lab Results  Component Value Date   CHOL 135 07/16/2020   TRIG 226 (H) 07/16/2020   HDL 37 (L) 07/16/2020   CHOLHDL 3.6 07/16/2020   VLDL 45 (H) 07/16/2020   LDLCALC 53 07/16/2020   LDLCALC UNABLE TO CALCULATE IF TRIGLYCERIDE OVER 400 mg/dL 93/79/0240    Physical Findings: AIMS: Facial and Oral Movements Muscles of Facial Expression: None, normal Lips and Perioral Area: None, normal Jaw: None, normal Tongue: None, normal,Extremity Movements Upper (arms, wrists, hands, fingers): None, normal Lower (legs, knees, ankles, toes): None, normal, Trunk Movements Neck, shoulders, hips: None, normal, Overall Severity Severity of abnormal movements (highest score from questions above): None, normal Incapacitation due to abnormal movements: None, normal Patient's awareness of abnormal movements (rate only patient's report): No Awareness, Dental Status Current problems with teeth and/or dentures?: No Does patient usually wear dentures?: No  CIWA:    COWS:     Musculoskeletal: Strength & Muscle Tone: within normal limits Gait & Station: normal Patient leans: N/A  Psychiatric Specialty Exam:     Blood pressure 116/68, pulse 70, temperature 97.6 F (36.4 C), temperature source Oral, resp. rate 16, height 5' 10.5" (1.791 m), weight 81.2 kg, SpO2 99 %.Body mass index is 25.32 kg/m.  General Appearance: Casual and Fairly Groomed  Eye Contact:  Good  Speech:  Clear and Coherent  and Normal Rate  Volume:  Normal  Mood:  Euthymic  Affect:  Congruent  Thought Process:  Coherent  Orientation:  Full (Time, Place, and Person)  Thought Content:  Logical   Suicidal Thoughts:  No  Homicidal Thoughts:  No  Memory:  Immediate;   Fair Recent;   Fair Remote;   Fair  Judgement:   Intact  Insight:  Present  Psychomotor Activity:  Normal  Concentration:  Concentration: Fair and Attention Span: Fair  Recall:  [employer] of Knowledge:  Fair  Language:  Fair  Akathisia:  Negative  Handed:  Right  AIMS (if indicated):     Assets:  Communication Skills Desire for Improvement Financial Resources/Insurance Housing Physical Health Resilience  ADL's:  Intact  Cognition:  WNL  Sleep:  Number of Hours: 6.75     Treatment Plan Summary: Daily contact with patient to assess and evaluate symptoms and progress in treatment and Medication management Patient improving today and denying suicidal ideations, and hallucinations. Group home unable to accept patient back until Monday 2/7//22. Tentative discharge planned for this date.   1) Schizoaffective Disorder, Bipolar Type- established problem, stable - Continue Clozaril 500 mg QHS, CBC 07/21/20 for next ANC check - Continue Zyprexa 10 mg BID, Zoloft 50 mg daily - Continue Lithium 450 mg BID, lithium level 0.86 on admission  2) Hyperlipidemia- established, stable -continue Zocor 40 mg daily  3) Diabetes Mellitus Type 2- established, stable  -Continue Metformin 1000 mg BID - Discontinue correctional novolog TID WC+QHS  4) Tobacco Use Disorder- established - Provided smoking cessation counseling today 07/19/20 - Continue nicorette gum 2 mg Q4 hr PRN   5) History of pulmonary embolism on chronic anticoagulation- established, stable - Continue Eliquis 5 mg daily  [name], MD 07/19/2020, 1:59 PM

## 2020-07-19 NOTE — Plan of Care (Signed)
Patient pleasant and cooperative on approach.Denies SI,HI and AVH.Compliant with medications. Visible in the milieu.Minimal interactions with peers.Appetite and energy level good. Did not attend groups. Support and encouragement given.

## 2020-07-19 NOTE — Plan of Care (Signed)
°  Problem: Group Participation °Goal: STG - Patient will engage in groups without prompting or encouragement from LRT x3 group sessions within 5 recreation therapy group sessions °Description: STG - Patient will engage in groups without prompting or encouragement from LRT x3 group sessions within 5 recreation therapy group sessions °Outcome: Progressing °  °

## 2020-07-19 NOTE — [facility] Group Notes (Signed)
LCSW Group Therapy Note  07/19/2020 2:05 PM  Type of Therapy/Topic:  Group Therapy:  Balance in Life  Participation Level:  Minimal  Description of Group:    This group will address the concept of balance and how it feels and looks when one is unbalanced. Patients will be encouraged to process areas in their lives that are out of balance and identify reasons for remaining unbalanced. Facilitators will guide patients in utilizing problem-solving interventions to address and correct the stressor making their life unbalanced. Understanding and applying boundaries will be explored and addressed for obtaining and maintaining a balanced life. Patients will be encouraged to explore ways to assertively make their unbalanced needs known to significant others in their lives, using other group members and facilitator for support and feedback.  Therapeutic Goals: 1. Patient will identify two or more emotions or situations they have that consume much of in their lives. 2. Patient will identify signs/triggers that life has become out of balance:  3. Patient will identify two ways to set boundaries in order to achieve balance in their lives:  4. Patient will demonstrate ability to communicate their needs through discussion and/or role plays  Summary of Patient Progress: Patient attended part of the group. He did not appear to be comfortable in the group setting as he had to leave part way through after being asked to speak. Patient came up after group and began to comment on the topic discussed in class.   Therapeutic Modalities:   Cognitive Behavioral Therapy Solution-Focused Therapy Assertiveness Training  [employer]. Algis Greenhouse, MSW, LCSW, [name] 07/19/2020 2:05 PM

## 2020-07-19 NOTE — Progress Notes (Signed)
Patient alert and oriented x 4, affect is flat but brightens upon approach, his thoughts are organized and coherent, he denies SI/HI/AVH he appears less anxious this evening, he is interacting appropriately with peers and staff and complaint with medication regimen., 15 minutes safety checks maintained will continue to monitor.

## 2020-07-19 NOTE — [place] FL2 (Signed)
[place] MEDICAID FL2 LEVEL OF CARE SCREENING TOOL     IDENTIFICATION  Patient Name: Lance [name] Birthdate: December 23, 1975 Sex: male Admission Date (Current Location): 07/15/2020  [place] and [place] Number:  Chiropodist and Address:  [facility], [address], [place], [place] [address]      Provider Number: [id number]  Attending Physician Name and Address:  [name], MD  Relative Name and Phone Number:       Current Level of Care: Hospital Recommended Level of Care: Assisted Living Facility,Family Care Home,Other (Comment) (Group Home) Prior Approval Number:    Date Approved/Denied:   PASRR Number:    Discharge Plan: Other (Comment) (Group Home, ALF, Family Care Home)    Current Diagnoses: Patient Active Problem List   Diagnosis Date Noted  . Alcohol abuse 04/12/2018  . Tobacco use disorder 09/01/2016  . Schizoaffective disorder, bipolar type (HCC) 05/22/2016  . Diabetes mellitus without complication (HCC) 05/21/2016  . Hyperlipidemia 05/21/2016  . History of pulmonary embolism 05/21/2016  . Chronic anticoagulation 05/21/2016  . Hypertension 09/25/2015    Orientation RESPIRATION BLADDER Height & Weight     Self,Time,Situation,Place  Normal Continent Weight: 179 lb (81.2 kg) Height:  5' 10.5" (179.1 cm)  BEHAVIORAL SYMPTOMS/MOOD NEUROLOGICAL BOWEL NUTRITION STATUS   (NA)   Continent    AMBULATORY STATUS COMMUNICATION OF NEEDS Skin   Independent Verbally Normal                       Personal Care Assistance Level of Assistance   (NA)           Functional Limitations Info  Sight Sight Info: Impaired (Wears readers/glasses)        SPECIAL CARE FACTORS FREQUENCY  Blood pressure (Diabetes mellitus without complication)                    Contractures Contractures Info: Not present    Additional Factors Info  Code Status,Allergies Code Status Info: Full Allergies Info: Penicillins            Current Medications (07/19/2020):  This is the current hospital active medication list Current Facility-Administered Medications  Medication Dose Route Frequency Provider Last Rate Last Admin  . acetaminophen (TYLENOL) tablet 650 mg  650 mg Oral Q6H PRN Clapacs, [name], MD      . albuterol (VENTOLIN HFA) 108 (90 Base) MCG/ACT inhaler 1-2 puff  1-2 puff Inhalation Q6H PRN [name], MD      . alum & mag hydroxide-simeth (MAALOX/MYLANTA) 200-200-20 MG/5ML suspension 30 mL  30 mL Oral Q4H PRN Clapacs, [name], MD      . apixaban (ELIQUIS) tablet 5 mg  5 mg Oral BID Clapacs, [name], MD   5 mg at 07/19/20 0757  . cloZAPine (CLOZARIL) tablet 500 mg  500 mg Oral QHS Clapacs, [name], MD   500 mg at 07/18/20 2107  . fluticasone furoate-vilanterol (BREO ELLIPTA) 200-25 MCG/INH 1 puff  1 puff Inhalation Daily [name], MD   1 puff at 07/19/20 0758  . hydrOXYzine (ATARAX/VISTARIL) tablet 50 mg  50 mg Oral TID PRN Clapacs, [name], MD   50 mg at 07/18/20 0820  . lisinopril (ZESTRIL) tablet 2.5 mg  2.5 mg Oral Daily Clapacs, [name], MD   2.5 mg at 07/19/20 0757  . lithium carbonate (ESKALITH) CR tablet 450 mg  450 mg Oral Q12H Clapacs, [name], MD   450  mg at 07/19/20 0756  . magnesium hydroxide (MILK OF MAGNESIA) suspension 30 mL  30 mL Oral Daily PRN Clapacs, [name], MD      . metFORMIN (GLUCOPHAGE) tablet 1,000 mg  1,000 mg Oral BID WC Clapacs, [name], MD   1,000 mg at 07/19/20 0756  . nicotine polacrilex (NICORETTE) gum 2 mg  2 mg Oral Q4H PRN [name], MD   2 mg at 07/17/20 1115  . OLANZapine (ZYPREXA) tablet 10 mg  10 mg Oral BID Clapacs, [name], MD   10 mg at 07/19/20 0756  . sertraline (ZOLOFT) tablet 50 mg  50 mg Oral Daily Clapacs, [name], MD   50 mg at 07/19/20 0756  . simvastatin (ZOCOR) tablet 40 mg  40 mg Oral q1800 Clapacs, [name], MD   40 mg at 07/18/20 1719  . traZODone (DESYREL) tablet 100 mg  100 mg Oral QHS Clapacs, [name], MD   100 mg at 07/18/20 2107  . traZODone  (DESYREL) tablet 100 mg  100 mg Oral QHS PRN Clapacs, [name], MD   100 mg at 07/17/20 2110     Discharge Medications: Please see discharge summary for a list of discharge medications.  Relevant Imaging Results:  Relevant Lab Results:   Additional Information    [name], LCSW

## 2020-07-20 DIAGNOSIS — F250 Schizoaffective disorder, bipolar type: Secondary | ICD-10-CM | POA: Diagnosis not present

## 2020-07-20 LAB — GLUCOSE, CAPILLARY
Glucose-Capillary: 153 mg/dL — ABNORMAL HIGH (ref 70–99)
Glucose-Capillary: 60 mg/dL — ABNORMAL LOW (ref 70–99)
Glucose-Capillary: 139 mg/dL — ABNORMAL HIGH (ref 70–99)
Glucose-Capillary: 138 mg/dL — ABNORMAL HIGH (ref 70–99)

## 2020-07-20 NOTE — [facility] Group Notes (Signed)
[facility] Group Notes: (Clinical Social Work)   07/20/2020      Type of Therapy:  Group Therapy   Participation Level:  Did Not Attend - was invited individually by Nurse/MHT and chose not to attend.   Susa Simmonds, LCSWA 07/20/2020  3:59 PM

## 2020-07-20 NOTE — Progress Notes (Signed)
Pt is alert and oriented to person, place, time and situation. Pt is calm, cooperative, affect is blunted. Pt is medication compliant, appetite is good. Pt slowly paces the hallways, sits in the dayroom for a while, then back to pacing. Pt is able to voice needs, asking for a Gatorade, and asked to do his laundry, also asked for a different size pair of scrubs. Self-talk noted, but pt denies hallucinations. Pt denies suicidal and homicidal ideation, denies feelings of depression and anxiety. No distress noted, none reported. Will continue to monitor pt per Q15 minute face checks and monitor for safety and progress.

## 2020-07-20 NOTE — Progress Notes (Signed)
[name] MD Progress Note  07/20/2020 8:48 AM Damen Windsor  MRN:  [id number]   Principal Problem: Schizoaffective disorder, bipolar type (HCC) Diagnosis: Principal Problem:   Schizoaffective disorder, bipolar type (HCC) Active Problems:   Hypertension   Diabetes mellitus without complication (HCC)   Hyperlipidemia   History of pulmonary embolism   Tobacco use disorder  Mr.Vandevoorde is a 45 y.o. male patient with history of schizoaffective disorder, bipolar type presenting who presents to the [facility] unit with worsening depression, suicidal ideations with plan to cut wrists.   Interval History Patient was seen today for re-evaluation.  Nursing reports no events overnight. The patient has no issues with performing ADLs.  Patient has been medication compliant.    Subjective:  On assessment patient reports "I am getting there". He reports "okay" mood; says he is still depressed in settings of thinking about possible return to his last group home. He reports he has been living there for two years and feeling unhappy. Reports that his depression was pretty chronic for last two years and suicidal thoughts would come and go. He hopes to be transferred to a different group home in [place], his hometown. He understands that the process might take a while. He reports feeling moderately-depressed, but not suicidal today. Denies thoughts and plans of hurting self or others. Denies any hallucinations today, does not express any delusions. Denies any physical complaints.  The patient reports no side effects from medications.    Labs: no new results for review.     Total Time spent with patient: 20 minutes  Past Psychiatric History: see H&P  Past Medical History:  Past Medical History:  Diagnosis Date  . Depression   . Diabetes mellitus without complication (HCC)   . Hyperlipidemia   . Hypertension   . Lupus anticoagulant disorder (HCC) 06/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  . PE (pulmonary  thromboembolism) (HCC)   . Schizo affective schizophrenia (HCC)   . Supratherapeutic INR 11/19/2016   History reviewed. No pertinent surgical history. Family History:  Family History  Problem Relation Age of Onset  . CAD Mother   . CAD Sister    Family Psychiatric  History: see H&P  Social History:  Social History   Substance and Sexual Activity  Alcohol Use No   Comment: occassionally     Social History   Substance and Sexual Activity  Drug Use Not Currently  . Types: Marijuana   Comment: Last use 11/29/16    Social History   Socioeconomic History  . Marital status: Single    Spouse name: Not on file  . Number of children: Not on file  . Years of education: Not on file  . Highest education level: Not on file  Occupational History  . Not on file  Tobacco Use  . Smoking status: Current Every Day Smoker    Packs/day: 1.00    Years: 23.00    Pack years: 23.00    Types: Cigarettes  . Smokeless tobacco: Never Used  . Tobacco comment: will provide material  Substance and Sexual Activity  . Alcohol use: No    Comment: occassionally  . Drug use: Not Currently    Types: Marijuana    Comment: Last use 11/29/16  . Sexual activity: Never    Birth control/protection: None    Comment: occasional marijuana- none recently  Other Topics Concern  . Not on file  Social History Narrative   From a group home in [place]  Social Determinants of Health   Financial Resource Strain: Not on file  Food Insecurity: Not on file  Transportation Needs: Not on file  Physical Activity: Not on file  Stress: Not on file  Social Connections: Not on file   Additional Social History:                         Sleep: Good  Appetite:  Good  Current Medications: Current Facility-Administered Medications  Medication Dose Route Frequency Provider Last Rate Last Admin  . acetaminophen (TYLENOL) tablet 650 mg  650 mg Oral Q6H PRN Clapacs, [name], MD      . albuterol (VENTOLIN  HFA) 108 (90 Base) MCG/ACT inhaler 1-2 puff  1-2 puff Inhalation Q6H PRN [name], MD      . alum & mag hydroxide-simeth (MAALOX/MYLANTA) 200-200-20 MG/5ML suspension 30 mL  30 mL Oral Q4H PRN Clapacs, [name], MD      . apixaban (ELIQUIS) tablet 5 mg  5 mg Oral BID Clapacs, [name], MD   5 mg at 07/20/20 0748  . cloZAPine (CLOZARIL) tablet 500 mg  500 mg Oral QHS Clapacs, [name], MD   500 mg at 07/19/20 2122  . fluticasone furoate-vilanterol (BREO ELLIPTA) 200-25 MCG/INH 1 puff  1 puff Inhalation Daily [name], MD   1 puff at 07/20/20 0749  . hydrOXYzine (ATARAX/VISTARIL) tablet 50 mg  50 mg Oral TID PRN Clapacs, [name], MD   50 mg at 07/18/20 0820  . lisinopril (ZESTRIL) tablet 2.5 mg  2.5 mg Oral Daily Clapacs, [name], MD   2.5 mg at 07/20/20 0747  . lithium carbonate (ESKALITH) CR tablet 450 mg  450 mg Oral Q12H Clapacs, [name], MD   450 mg at 07/20/20 0748  . magnesium hydroxide (MILK OF MAGNESIA) suspension 30 mL  30 mL Oral Daily PRN Clapacs, [name], MD      . metFORMIN (GLUCOPHAGE) tablet 1,000 mg  1,000 mg Oral BID WC Clapacs, [name], MD   1,000 mg at 07/20/20 0748  . nicotine polacrilex (NICORETTE) gum 2 mg  2 mg Oral Q4H PRN [name], MD   2 mg at 07/17/20 1115  . OLANZapine (ZYPREXA) tablet 10 mg  10 mg Oral BID Clapacs, [name], MD   10 mg at 07/20/20 0748  . sertraline (ZOLOFT) tablet 50 mg  50 mg Oral Daily Clapacs, [name], MD   50 mg at 07/20/20 0748  . simvastatin (ZOCOR) tablet 40 mg  40 mg Oral q1800 Clapacs, [name], MD   40 mg at 07/19/20 1727  . traZODone (DESYREL) tablet 100 mg  100 mg Oral QHS Clapacs, [name], MD   100 mg at 07/19/20 2123  . traZODone (DESYREL) tablet 100 mg  100 mg Oral QHS PRN Clapacs, [name], MD   100 mg at 07/17/20 2110    Lab Results:  Results for orders placed or performed during the hospital encounter of 07/15/20 (from the past 48 hour(s))  Glucose, capillary     Status: Abnormal   Collection Time: 07/18/20 11:20 AM  Result  Value Ref Range   Glucose-Capillary 69 (L) 70 - 99 mg/dL    Comment: Glucose reference range applies only to samples taken after fasting for at least 8 hours.  Glucose, capillary     Status: Abnormal   Collection Time: 07/18/20  4:05 PM  Result Value Ref Range   Glucose-Capillary 106 (H) 70 -  99 mg/dL    Comment: Glucose reference range applies only to samples taken after fasting for at least 8 hours.  Glucose, capillary     Status: Abnormal   Collection Time: 07/18/20  8:44 PM  Result Value Ref Range   Glucose-Capillary 112 (H) 70 - 99 mg/dL    Comment: Glucose reference range applies only to samples taken after fasting for at least 8 hours.  Glucose, capillary     Status: Abnormal   Collection Time: 07/19/20  7:43 AM  Result Value Ref Range   Glucose-Capillary 108 (H) 70 - 99 mg/dL    Comment: Glucose reference range applies only to samples taken after fasting for at least 8 hours.  Glucose, capillary     Status: Abnormal   Collection Time: 07/19/20 11:19 AM  Result Value Ref Range   Glucose-Capillary 50 (L) 70 - 99 mg/dL    Comment: Glucose reference range applies only to samples taken after fasting for at least 8 hours.  Glucose, capillary     Status: Abnormal   Collection Time: 07/19/20 11:45 AM  Result Value Ref Range   Glucose-Capillary 125 (H) 70 - 99 mg/dL    Comment: Glucose reference range applies only to samples taken after fasting for at least 8 hours.  Glucose, capillary     Status: None   Collection Time: 07/19/20  4:05 PM  Result Value Ref Range   Glucose-Capillary 98 70 - 99 mg/dL    Comment: Glucose reference range applies only to samples taken after fasting for at least 8 hours.  Glucose, capillary     Status: Abnormal   Collection Time: 07/20/20  6:49 AM  Result Value Ref Range   Glucose-Capillary 153 (H) 70 - 99 mg/dL    Comment: Glucose reference range applies only to samples taken after fasting for at least 8 hours.    Blood Alcohol level:  Lab Results   Component Value Date   ETH <10 07/14/2020   ETH <10 01/04/2020    Metabolic Disorder Labs: Lab Results  Component Value Date   HGBA1C 5.3 07/16/2020   MPG 105.41 07/16/2020   MPG 105.41 07/14/2020   Lab Results  Component Value Date   PROLACTIN 2.0 (L) 08/31/2016   PROLACTIN 4.8 05/22/2016   Lab Results  Component Value Date   CHOL 135 07/16/2020   TRIG 226 (H) 07/16/2020   HDL 37 (L) 07/16/2020   CHOLHDL 3.6 07/16/2020   VLDL 45 (H) 07/16/2020   LDLCALC 53 07/16/2020   LDLCALC UNABLE TO CALCULATE IF TRIGLYCERIDE OVER 400 mg/dL 64/33/2951    Physical Findings: AIMS: Facial and Oral Movements Muscles of Facial Expression: None, normal Lips and Perioral Area: None, normal Jaw: None, normal Tongue: None, normal,Extremity Movements Upper (arms, wrists, hands, fingers): None, normal Lower (legs, knees, ankles, toes): None, normal, Trunk Movements Neck, shoulders, hips: None, normal, Overall Severity Severity of abnormal movements (highest score from questions above): None, normal Incapacitation due to abnormal movements: None, normal Patient's awareness of abnormal movements (rate only patient's report): No Awareness, Dental Status Current problems with teeth and/or dentures?: No Does patient usually wear dentures?: No  CIWA:    COWS:     Musculoskeletal: Strength & Muscle Tone: within normal limits Gait & Station: normal Patient leans: N/A  Psychiatric Specialty Exam: Physical Exam  Review of Systems  Blood pressure 116/68, pulse 73, temperature (!) 97.5 F (36.4 C), temperature source Oral, resp. rate 18, height 5' 10.5" (1.791 m), weight 81.2 kg, SpO2 99 %.  Body mass index is 25.32 kg/m.  General Appearance: Casual  Eye Contact:  Good  Speech:  Clear and Coherent and Normal Rate  Volume:  Normal  Mood:  Euthymic  Affect:  Congruent and Constricted  Thought Process:  Coherent and Goal Directed  Orientation:  Full (Time, Place, and Person)  Thought  Content:  Logical  Suicidal Thoughts:  No  Homicidal Thoughts:  No  Memory:  Immediate;   Fair Recent;   Fair Remote;   Fair  Judgement:  Fair  Insight:  Present  Psychomotor Activity:  Normal  Concentration:  Concentration: Fair and Attention Span: Fair  Recall:  [employer] of Knowledge:  Fair  Language:  Fair  Akathisia:  No  Handed:  Right  AIMS (if indicated):     Assets:  Communication Skills Desire for Improvement Financial Resources/Insurance Housing Physical Health Resilience Social Support  ADL's:  Intact  Cognition:  WNL  Sleep:  Number of Hours: 8     Treatment Plan Summary: Daily contact with patient to assess and evaluate symptoms and progress in treatment and Medication management   Patient is a 45 year old male/male with the above-stated past psychiatric history who is seen in follow-up.  Chart reviewed. Patient discussed with nursing. Patient`s mood appear to be stable; no safety concerns, no worsening of symptoms. No indication for medication changes today. Patient is awaiting placement.   Plan:  -continue inpatient psych admission; 15-minute checks; daily contact with patient to assess and evaluate symptoms and progress in treatment; psychoeducation.  -continue scheduled medications: 1) Schizoaffective Disorder, Bipolar Type- established problem, stable - Continue Clozaril 500 mg QHS, CBC 07/21/20 for next ANC check - Continue Zyprexa 10 mg BID, Zoloft 50 mg daily - Continue Lithium 450 mg BID, lithium level 0.86 on admission  2) Hyperlipidemia- established, stable -continue Zocor 40 mg daily  3) Diabetes Mellitus Type 2- established, stable  -Continue Metformin 1000 mg BID  4) Tobacco Use Disorder- established - Provided smoking cessation counseling today 07/19/20 - Continue nicorette gum 2 mg Q4 hr PRN   5) History of pulmonary embolism on chronic anticoagulation- established, stable - Continue Eliquis 5 mg daily  -continue PRN  medications.  acetaminophen, albuterol, alum & mag hydroxide-simeth, hydrOXYzine, magnesium hydroxide, nicotine polacrilex, traZODone  -Pertinent Labs: no new labs ordered today. CBC scheduled for 07/21/20.  -EKG: on 1/31 showed Qtc 465; sinus arrhythmia, RBBB.    -Consults: No new consults placed since yesterday    -Disposition: Estimated duration of hospitalization: early-midweek next week. Social worker to help patient get into housing. All necessary aftercare will be arranged prior to discharge. Likely d/c to group home with outpatient psych follow-up.  -  I certify that the patient does need, on a daily basis, active treatment furnished directly by or requiring the supervision of inpatient psychiatric facility personnel.   Thalia Party, MD 07/20/2020, 8:48 AM

## 2020-07-20 NOTE — [facility] Counselor (Signed)
2:49 PM- CSW contacted legal guardian Melina Fiddler to go over medicare important message. CSW left a message requesting a call back.  3:00 PM- Ms. Harris contacted CSW back. CSW attempted to go over the medicare important message. Ms. Tiburcio Pea stated that another social worker during the week has already contacted her about patients medicare rights. Ms. Tiburcio Pea stated she did not need to hear it again and that she was already informed. Ms. Tiburcio Pea stated she is not going to appeal and agrees with the doctors decision to discharge patient on Monday. Ms. Tiburcio Pea stated that she knows what patient wants and it might take her some time to find him a new group home.

## 2020-07-21 DIAGNOSIS — F250 Schizoaffective disorder, bipolar type: Secondary | ICD-10-CM | POA: Diagnosis not present

## 2020-07-21 LAB — CBC WITH DIFFERENTIAL/PLATELET
Abs Immature Granulocytes: 0.02 10*3/uL (ref 0.00–0.07)
Basophils Absolute: 0 10*3/uL (ref 0.0–0.1)
Basophils Relative: 0 %
Eosinophils Absolute: 0 10*3/uL (ref 0.0–0.5)
Eosinophils Relative: 0 %
HCT: 39.7 % (ref 39.0–52.0)
Hemoglobin: 13.5 g/dL (ref 13.0–17.0)
Immature Granulocytes: 1 %
Lymphocytes Relative: 26 %
Lymphs Abs: 1.1 10*3/uL (ref 0.7–4.0)
MCH: 31.6 pg (ref 26.0–34.0)
MCHC: 34 g/dL (ref 30.0–36.0)
MCV: 93 fL (ref 80.0–100.0)
Monocytes Absolute: 0.5 10*3/uL (ref 0.1–1.0)
Monocytes Relative: 11 %
Neutro Abs: 2.7 10*3/uL (ref 1.7–7.7)
Neutrophils Relative %: 62 %
Platelets: 185 10*3/uL (ref 150–400)
RBC: 4.27 MIL/uL (ref 4.22–5.81)
RDW: 12.5 % (ref 11.5–15.5)
WBC: 4.3 10*3/uL (ref 4.0–10.5)
nRBC: 0 % (ref 0.0–0.2)

## 2020-07-21 LAB — GLUCOSE, CAPILLARY
Glucose-Capillary: 107 mg/dL — ABNORMAL HIGH (ref 70–99)
Glucose-Capillary: 74 mg/dL (ref 70–99)
Glucose-Capillary: 109 mg/dL — ABNORMAL HIGH (ref 70–99)
Glucose-Capillary: 90 mg/dL (ref 70–99)

## 2020-07-21 MED ORDER — SERTRALINE HCL 25 MG PO TABS
50.0000 mg | ORAL_TABLET | Freq: Once | ORAL | Status: AC
  Administered 2020-07-21: 50 mg via ORAL
  Filled 2020-07-21: qty 2

## 2020-07-21 MED ORDER — SERTRALINE HCL 100 MG PO TABS
100.0000 mg | ORAL_TABLET | Freq: Every day | ORAL | Status: DC
  Administered 2020-07-22: 100 mg via ORAL
  Filled 2020-07-21: qty 1

## 2020-07-21 NOTE — Progress Notes (Signed)
[facility] MD Progress Note  07/21/2020 9:32 AM Lance [name]  MRN:  [id number]   Principal Problem: Schizoaffective disorder, bipolar type (HCC) Diagnosis: Principal Problem:   Schizoaffective disorder, bipolar type (HCC) Active Problems:   Hypertension   Diabetes mellitus without complication (HCC)   Hyperlipidemia   History of pulmonary embolism   Tobacco use disorder  Lance [name] is a 44 y.o. male patient with history of schizoaffective disorder, bipolar type presenting who presents to the [facility] unit with worsening depression, suicidal ideations with plan to cut wrists.   Interval History Patient was seen today for re-evaluation.  Nursing reports no events overnight. The patient has no issues with performing ADLs.  Patient has been medication compliant.    Subjective:  On assessment patient reports he is feeling upset when thinking that he has to return back to his last Group home prior to be transferred top a new one. He says he understands the duration of process is unpredictable and that he hopes his guardian will find a placement asap. He is anxious tio return back to his current group home and depressed somewhat. He denies suicidal or homicidal thoughts, plans. He is agreeable to go up on a dose of antidepressant. Denies any hallucinations today, does not express any delusions. Denies any physical complaints. The patient reports no side effects from medications.    Labs: CBC today 07/21/20 - wnl with WBC 4.3 K/ul and ANC 2.7 K/uL. Blood glucose today was 107mg /dL.    Total Time spent with patient: 20 minutes  Past Psychiatric History: see H&P  Past Medical History:  Past Medical History:  Diagnosis Date  . Depression   . Diabetes mellitus without complication (HCC)   . Hyperlipidemia   . Hypertension   . Lupus anticoagulant disorder (HCC) 06/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  . PE (pulmonary thromboembolism) (HCC)   . Schizo affective schizophrenia (HCC)   .  Supratherapeutic INR 11/19/2016   History reviewed. No pertinent surgical history. Family History:  Family History  Problem Relation Age of Onset  . CAD Mother   . CAD Sister    Family Psychiatric  History: see H&P  Social History:  Social History   Substance and Sexual Activity  Alcohol Use No   Comment: occassionally     Social History   Substance and Sexual Activity  Drug Use Not Currently  . Types: Marijuana   Comment: Last use 11/29/16    Social History   Socioeconomic History  . Marital status: Single    Spouse name: Not on file  . Number of children: Not on file  . Years of education: Not on file  . Highest education level: Not on file  Occupational History  . Not on file  Tobacco Use  . Smoking status: Current Every Day Smoker    Packs/day: 1.00    Years: 23.00    Pack years: 23.00    Types: Cigarettes  . Smokeless tobacco: Never Used  . Tobacco comment: will provide material  Substance and Sexual Activity  . Alcohol use: No    Comment: occassionally  . Drug use: Not Currently    Types: Marijuana    Comment: Last use 11/29/16  . Sexual activity: Never    Birth control/protection: None    Comment: occasional marijuana- none recently  Other Topics Concern  . Not on file  Social History Narrative   From a group home in [place]   Social Determinants of Health   Financial  Resource Strain: Not on file  Food Insecurity: Not on file  Transportation Needs: Not on file  Physical Activity: Not on file  Stress: Not on file  Social Connections: Not on file   Additional Social History:                         Sleep: Good  Appetite:  Good  Current Medications: Current Facility-Administered Medications  Medication Dose Route Frequency Provider Last Rate Last Admin  . acetaminophen (TYLENOL) tablet 650 mg  650 mg Oral Q6H PRN Clapacs, [name], MD      . albuterol (VENTOLIN HFA) 108 (90 Base) MCG/ACT inhaler 1-2 puff  1-2 puff Inhalation Q6H  PRN Jesse SansFreeman, Megan M, MD      . alum & mag hydroxide-simeth (MAALOX/MYLANTA) 200-200-20 MG/5ML suspension 30 mL  30 mL Oral Q4H PRN Clapacs, Jackquline DenmarkJohn T, MD      . apixaban (ELIQUIS) tablet 5 mg  5 mg Oral BID Clapacs, Jackquline DenmarkJohn T, MD   5 mg at 07/21/20 0801  . cloZAPine (CLOZARIL) tablet 500 mg  500 mg Oral QHS Clapacs, Jackquline DenmarkJohn T, MD   500 mg at 07/20/20 2107  . fluticasone furoate-vilanterol (BREO ELLIPTA) 200-25 MCG/INH 1 puff  1 puff Inhalation Daily Jesse SansFreeman, Megan M, MD   1 puff at 07/21/20 0802  . hydrOXYzine (ATARAX/VISTARIL) tablet 50 mg  50 mg Oral TID PRN Clapacs, Jackquline DenmarkJohn T, MD   50 mg at 07/20/20 2107  . lisinopril (ZESTRIL) tablet 2.5 mg  2.5 mg Oral Daily Clapacs, [name], MD   2.5 mg at 07/21/20 0801  . lithium carbonate (ESKALITH) CR tablet 450 mg  450 mg Oral Q12H Clapacs, Jackquline DenmarkJohn T, MD   450 mg at 07/21/20 0801  . magnesium hydroxide (MILK OF MAGNESIA) suspension 30 mL  30 mL Oral Daily PRN Clapacs, [name], MD      . metFORMIN (GLUCOPHAGE) tablet 1,000 mg  1,000 mg Oral BID WC Clapacs, Jackquline DenmarkJohn T, MD   1,000 mg at 07/21/20 0801  . nicotine polacrilex (NICORETTE) gum 2 mg  2 mg Oral Q4H PRN Jesse SansFreeman, Megan M, MD   2 mg at 07/17/20 1115  . OLANZapine (ZYPREXA) tablet 10 mg  10 mg Oral BID Clapacs, Jackquline DenmarkJohn T, MD   10 mg at 07/21/20 0801  . [START ON 07/22/2020] sertraline (ZOLOFT) tablet 100 mg  100 mg Oral Daily Lochlin Eppinger, MD      . simvastatin (ZOCOR) tablet 40 mg  40 mg Oral q1800 Clapacs, Jackquline DenmarkJohn T, MD   40 mg at 07/20/20 1638  . traZODone (DESYREL) tablet 100 mg  100 mg Oral QHS Clapacs, Jackquline DenmarkJohn T, MD   100 mg at 07/20/20 2106  . traZODone (DESYREL) tablet 100 mg  100 mg Oral QHS PRN Clapacs, Jackquline DenmarkJohn T, MD   100 mg at 07/17/20 2110    Lab Results:  Results for orders placed or performed during the hospital encounter of 07/15/20 (from the past 48 hour(s))  Glucose, capillary     Status: Abnormal   Collection Time: 07/19/20 11:19 AM  Result Value Ref Range   Glucose-Capillary 50 (L) 70 - 99 mg/dL    Comment:  Glucose reference range applies only to samples taken after fasting for at least 8 hours.  Glucose, capillary     Status: Abnormal   Collection Time: 07/19/20 11:45 AM  Result Value Ref Range   Glucose-Capillary 125 (H) 70 - 99 mg/dL    Comment: Glucose reference  range applies only to samples taken after fasting for at least 8 hours.  Glucose, capillary     Status: None   Collection Time: 07/19/20  4:05 PM  Result Value Ref Range   Glucose-Capillary 98 70 - 99 mg/dL    Comment: Glucose reference range applies only to samples taken after fasting for at least 8 hours.  Glucose, capillary     Status: Abnormal   Collection Time: 07/20/20  6:49 AM  Result Value Ref Range   Glucose-Capillary 153 (H) 70 - 99 mg/dL    Comment: Glucose reference range applies only to samples taken after fasting for at least 8 hours.  Glucose, capillary     Status: Abnormal   Collection Time: 07/20/20 11:37 AM  Result Value Ref Range   Glucose-Capillary 60 (L) 70 - 99 mg/dL    Comment: Glucose reference range applies only to samples taken after fasting for at least 8 hours.  Glucose, capillary     Status: Abnormal   Collection Time: 07/20/20  4:24 PM  Result Value Ref Range   Glucose-Capillary 139 (H) 70 - 99 mg/dL    Comment: Glucose reference range applies only to samples taken after fasting for at least 8 hours.  Glucose, capillary     Status: Abnormal   Collection Time: 07/20/20  9:19 PM  Result Value Ref Range   Glucose-Capillary 138 (H) 70 - 99 mg/dL    Comment: Glucose reference range applies only to samples taken after fasting for at least 8 hours.  Glucose, capillary     Status: Abnormal   Collection Time: 07/21/20  6:08 AM  Result Value Ref Range   Glucose-Capillary 107 (H) 70 - 99 mg/dL    Comment: Glucose reference range applies only to samples taken after fasting for at least 8 hours.  CBC with Differential/Platelet     Status: None   Collection Time: 07/21/20  8:41 AM  Result Value Ref Range    WBC 4.3 4.0 - 10.5 K/uL   RBC 4.27 4.22 - 5.81 MIL/uL   Hemoglobin 13.5 13.0 - 17.0 g/dL   HCT 36.6 44.0 - 34.7 %   MCV 93.0 80.0 - 100.0 fL   MCH 31.6 26.0 - 34.0 pg   MCHC 34.0 30.0 - 36.0 g/dL   RDW 42.5 95.6 - 38.7 %   Platelets 185 150 - 400 K/uL   nRBC 0.0 0.0 - 0.2 %   Neutrophils Relative % 62 %   Neutro Abs 2.7 1.7 - 7.7 K/uL   Lymphocytes Relative 26 %   Lymphs Abs 1.1 0.7 - 4.0 K/uL   Monocytes Relative 11 %   Monocytes Absolute 0.5 0.1 - 1.0 K/uL   Eosinophils Relative 0 %   Eosinophils Absolute 0.0 0.0 - 0.5 K/uL   Basophils Relative 0 %   Basophils Absolute 0.0 0.0 - 0.1 K/uL   Immature Granulocytes 1 %   Abs Immature Granulocytes 0.02 0.00 - 0.07 K/uL    Comment: Performed at [facility], [address] [place], [place] [address]    Blood Alcohol level:  Lab Results  Component Value Date   [facility] <10 07/14/2020   ETH <10 01/04/2020    Metabolic Disorder Labs: Lab Results  Component Value Date   HGBA1C 5.3 07/16/2020   MPG 105.41 07/16/2020   MPG 105.41 07/14/2020   Lab Results  Component Value Date   PROLACTIN 2.0 (L) 08/31/2016   PROLACTIN 4.8 05/22/2016   Lab Results  Component Value Date  CHOL 135 07/16/2020   TRIG 226 (H) 07/16/2020   HDL 37 (L) 07/16/2020   CHOLHDL 3.6 07/16/2020   VLDL 45 (H) 07/16/2020   LDLCALC 53 07/16/2020   LDLCALC UNABLE TO CALCULATE IF TRIGLYCERIDE OVER 400 mg/dL 35/36/1443    Physical Findings: AIMS: Facial and Oral Movements Muscles of Facial Expression: None, normal Lips and Perioral Area: None, normal Jaw: None, normal Tongue: None, normal,Extremity Movements Upper (arms, wrists, hands, fingers): None, normal Lower (legs, knees, ankles, toes): None, normal, Trunk Movements Neck, shoulders, hips: None, normal, Overall Severity Severity of abnormal movements (highest score from questions above): None, normal Incapacitation due to abnormal movements: None, normal Patient's awareness of  abnormal movements (rate only patient's report): No Awareness, Dental Status Current problems with teeth and/or dentures?: No Does patient usually wear dentures?: No  CIWA:    COWS:     Musculoskeletal: Strength & Muscle Tone: within normal limits Gait & Station: normal Patient leans: N/A  Psychiatric Specialty Exam: Physical Exam   Review of Systems   Blood pressure 115/66, pulse 67, temperature (!) 97.5 F (36.4 C), temperature source Oral, resp. rate 18, height 5' 10.5" (1.791 m), weight 81.2 kg, SpO2 99 %.Body mass index is 25.32 kg/m.  General Appearance: Casual  Eye Contact:  Good  Speech:  Clear and Coherent and Normal Rate  Volume:  Normal  Mood:  Euthymic  Affect:  Congruent and Constricted  Thought Process:  Coherent and Goal Directed  Orientation:  Full (Time, Place, and Person)  Thought Content:  Logical  Suicidal Thoughts:  No  Homicidal Thoughts:  No  Memory:  Immediate;   Fair Recent;   Fair Remote;   Fair  Judgement:  Fair  Insight:  Present  Psychomotor Activity:  Normal  Concentration:  Concentration: Fair and Attention Span: Fair  Recall:  [employer] of Knowledge:  Fair  Language:  Fair  Akathisia:  No  Handed:  Right  AIMS (if indicated):     Assets:  Communication Skills Desire for Improvement Financial Resources/Insurance Housing Physical Health Resilience Social Support  ADL's:  Intact  Cognition:  WNL  Sleep:  Number of Hours: 7     Treatment Plan Summary: Daily contact with patient to assess and evaluate symptoms and progress in treatment and Medication management   Patient is a 45 year old male/male with the above-stated past psychiatric history who is seen in follow-up.  Chart reviewed. Patient discussed with nursing. Patient repots increased anxiety and depression in settings of current plan to discharge him back to the same group home. No unsafe thoughts, plans. Will increase the dose of antidepressant, no other changes in  medications today.   Plan:  -continue inpatient psych admission; 15-minute checks; daily contact with patient to assess and evaluate symptoms and progress in treatment; psychoeducation.  -continue scheduled medications: 1) Schizoaffective Disorder, Bipolar Type- established problem, stable - Continue Clozaril 500 mg QHS, CBC today 07/21/20 - WNL. - Continue Zyprexa 10 mg BID,  -increase Zoloft to 100 mg daily for depression and anxiety - Continue Lithium 450 mg BID, lithium level 0.86 on admission  2) Hyperlipidemia- established, stable -continue Zocor 40 mg daily  3) Diabetes Mellitus Type 2- established, stable  -Continue Metformin 1000 mg BID  4) Tobacco Use Disorder- established - Provided smoking cessation counseling today 07/19/20 - Continue nicorette gum 2 mg Q4 hr PRN   5) History of pulmonary embolism on chronic anticoagulation- established, stable - Continue Eliquis 5 mg daily  -continue PRN medications.  acetaminophen, albuterol, alum & mag hydroxide-simeth, hydrOXYzine, magnesium hydroxide, nicotine polacrilex, traZODone  -Pertinent Labs: CBC today 07/21/20:  wnl with WBC 4.3 K/ul and ANC 2.7 K/uL. Blood glucose today was 107mg /dL.  -EKG: on 1/31 showed Qtc 465; sinus arrhythmia, RBBB.    -Consults: No new consults placed since yesterday    -Disposition: Estimated duration of hospitalization: early next week. Social worker to help patient get into housing. All necessary aftercare will be arranged prior to discharge. Likely d/c to group home with outpatient psych follow-up.  -  I certify that the patient does need, on a daily basis, active treatment furnished directly by or requiring the supervision of inpatient psychiatric facility personnel.   2/31, MD 07/21/2020, 9:32 AM

## 2020-07-21 NOTE — Progress Notes (Signed)
Pt is alert and oriented to person, place, time and situation. Pt reports he is depressed because he does not wish to return to his group home, because they treat him poorly without respect. Pt rates his depression 5/10 on a 0-10 scale. Pt denies anxiety, denies suicidal and homicidal ideation. Pt is calm, cooperative, affect is blunted, paces the hallways slowly, noted some self-talk. Pt's appetite is good, has been medication compliant, reports he felt that he slept well last night. No distress noted, will continue to monitor pt per Q15 minute face checks and monitor for safety and progress.

## 2020-07-21 NOTE — [facility] Group Notes (Signed)
LCSW Group Therapy Note  07/21/2020   1:25 PM- 2:00 PM   Type of Therapy and Topic:  Group Therapy: Anger Cues and Responses  Participation Level:  Active   Description of Group:   In this group, patients learned how to recognize the physical, cognitive, emotional, and behavioral responses they have to anger-provoking situations.  They identified a recent time they became angry and how they reacted.  They analyzed how their reaction was possibly beneficial and how it was possibly unhelpful.  The group discussed a variety of healthier coping skills that could help with such a situation in the future.  Focus was placed on how helpful it is to recognize the underlying emotions to our anger, because working on those can lead to a more permanent solution as well as our ability to focus on the important rather than the urgent.  Therapeutic Goals: 1. Patients will remember their last incident of anger and how they felt emotionally and physically, what their thoughts were at the time, and how they behaved. 2. Patients will identify how their behavior at that time worked for them, as well as how it worked against them. 3. Patients will explore possible new behaviors to use in future anger situations. 4. Patients will learn that anger itself is normal and cannot be eliminated, and that healthier reactions can assist with resolving conflict rather than worsening situations.  Summary of Patient Progress: Patient stated that people who have attitudes and are disrespectful make him upset. Patient stated when people are mad they might fight or throw things. Patient left group early.    Therapeutic Modalities:   Cognitive Behavioral Therapy    Susa Simmonds, Theresia Majors 07/21/2020  2:53 PM

## 2020-07-21 NOTE — Progress Notes (Signed)
   07/20/20 2000  Psych Admission Type (Psych Patients Only)  Admission Status Voluntary  Psychosocial Assessment  Patient Complaints None  Eye Contact Fair  Facial Expression Sullen  Affect Appropriate to circumstance  Speech Logical/coherent  Interaction Assertive  Motor Activity Slow  Appearance/Hygiene Improved  Behavior Characteristics Cooperative;Appropriate to situation  Mood Pleasant  Aggressive Behavior  Effect No apparent injury  Thought Process  Coherency WDL  Content WDL  Delusions None reported or observed  Perception WDL  Hallucination Auditory  Judgment WDL  Confusion WDL  Danger to Self  Current suicidal ideation? Denies  Self-Injurious Behavior No self-injurious ideation or behavior indicators observed or expressed   Danger to Others  Danger to Others None reported or observed  Patient compliant with medications interacted well with peers and staff. Denies SI/HI appears to be responding to internal stimuli, but Denies A/VH. Support and encouragement provided as needed.

## 2020-07-21 NOTE — Progress Notes (Signed)
Patient calm and cooperative during assessment denying SI/HI/AVH and anxiety. Patient endorses depression. Patient stated he was probably going to be leaving tomorrow and wasn't sure how he felt about that. Patient given education, support, and encouragement. Patient observed interacting appropriately with staff and peers on the unit. Patient being monitored Q 15 minutes for safety per unit protocol. Pt remains safe on the unit.

## 2020-07-21 NOTE — Plan of Care (Signed)
Patient presents at his baseline  Problem: Education: Goal: Emotional status will improve Outcome: Not Progressing Goal: Mental status will improve Outcome: Not Progressing   

## 2020-07-22 DIAGNOSIS — F250 Schizoaffective disorder, bipolar type: Secondary | ICD-10-CM | POA: Diagnosis not present

## 2020-07-22 MED ORDER — CLOZAPINE 100 MG PO TABS
500.0000 mg | ORAL_TABLET | Freq: Every day | ORAL | 1 refills | Status: DC
Start: 2020-07-22 — End: 2020-11-18

## 2020-07-22 MED ORDER — OLANZAPINE 10 MG PO TABS
10.0000 mg | ORAL_TABLET | Freq: Two times a day (BID) | ORAL | 1 refills | Status: DC
Start: 2020-07-22 — End: 2020-09-19

## 2020-07-22 MED ORDER — SERTRALINE HCL 50 MG PO TABS: 50.0000 mg | ORAL_TABLET | Freq: Every day | ORAL | 1 refills | Status: DC

## 2020-07-22 MED ORDER — TRAZODONE HCL 100 MG PO TABS
100.0000 mg | ORAL_TABLET | Freq: Every day | ORAL | 1 refills | Status: DC
Start: 2020-07-22 — End: 2020-11-18

## 2020-07-22 MED ORDER — LITHIUM CARBONATE ER 450 MG PO TBCR: 450.0000 mg | EXTENDED_RELEASE_TABLET | Freq: Two times a day (BID) | ORAL | 1 refills | Status: DC

## 2020-07-22 NOTE — Progress Notes (Signed)
Recreation Therapy Notes   Date: 07/22/2020  Time: 9:30 am   Location: Craft room   Behavioral response: Appropriate,Restless   Intervention Topic: Relaxation   Discussion/Intervention:  Group content today was focused on relaxation. The group defined relaxation and identified healthy ways to relax. Individuals expressed how much time they spend relaxing. Patients expressed how much their life would be if they did not make time for themselves to relax. The group stated ways they could improve their relaxation techniques in the future.  Individuals participated in the intervention "Time to Relax" where they had a chance to experience different relaxation techniques.  Clinical Observations/Feedback: Patient came to group and expressed that he listens to music to relax. Participant walked in and out of group several times and then eventually left group early due to unknown reasons. Drayton Tieu LRT/CTRS          Byrd Terrero 07/22/2020 12:03 PM

## 2020-07-22 NOTE — Discharge Summary (Signed)
Physician Discharge Summary Note  Patient:  Lance [name] is an 45 y.o., male MRN:  [id number] DOB:  29-Jan-1976 Patient phone:  [phone] (home)  Patient address:   [facility] [address] [place] [address],  Total Time spent with patient: 35 minutes- 25 spent with patietn, 10 minutes coordinating discharge, charting, and presciptions  Date of Admission:  07/15/2020 Date of Discharge: 07/22/2020  Reason for Admission:  Patient is a 45 year old male with  schizoaffective disorder, bipolar type who presented with worsening depression, suicidal ideations with plan to cut wrists, and auditory hallucinations.   Principal Problem: Schizoaffective disorder, bipolar type [facility]) Discharge Diagnoses: Principal Problem:   Schizoaffective disorder, bipolar type (HCC) Active Problems:   Hypertension   Diabetes mellitus without complication (HCC)   Hyperlipidemia   History of pulmonary embolism   Tobacco use disorder   Past Psychiatric History: Long history of schizoaffective disorder multiple hospitalizations. 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 History of suicide attempts in the past.   Past Medical History:  Past Medical History:  Diagnosis Date  . Depression   . Diabetes mellitus without complication (HCC)   . Hyperlipidemia   . Hypertension   . Lupus anticoagulant disorder (HCC) 06/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  . PE (pulmonary thromboembolism) (HCC)   . Schizo affective schizophrenia (HCC)   . Supratherapeutic INR 11/19/2016   History reviewed. No pertinent surgical history. Family History:  Family History  Problem Relation Age of Onset  . CAD Mother   . CAD Sister    Family Psychiatric  History:Positive for mental health problems especially mood problems in the family Social History:  Social History   Substance and Sexual Activity  Alcohol Use No   Comment: occassionally     Social History    Substance and Sexual Activity  Drug Use Not Currently  . Types: Marijuana   Comment: Last use 11/29/16    Social History   Socioeconomic History  . Marital status: Single    Spouse name: Not on file  . Number of children: Not on file  . Years of education: Not on file  . Highest education level: Not on file  Occupational History  . Not on file  Tobacco Use  . Smoking status: Current Every Day Smoker    Packs/day: 1.00    Years: 23.00    Pack years: 23.00    Types: Cigarettes  . Smokeless tobacco: Never Used  . Tobacco comment: will provide material  Substance and Sexual Activity  . Alcohol use: No    Comment: occassionally  . Drug use: Not Currently    Types: Marijuana    Comment: Last use 11/29/16  . Sexual activity: Never    Birth control/protection: None    Comment: occasional marijuana- none recently  Other Topics Concern  . Not on file  Social History Narrative   From a group home in [place]   Social Determinants of Health   Financial Resource Strain: Not on file  Food Insecurity: Not on file  Transportation Needs: Not on file  Physical Activity: Not on file  Stress: Not on file  Social Connections: Not on file    Hospital Course:  Patient is a 45 year old male with  schizoaffective disorder, bipolar type who presented with worsening depression, suicidal ideations with plan to cut wrists, and auditory hallucinations. Patient was restarted on his home medications on admission. His Zoloft was increased  to 100 mg daily for depression, and Zyprexa 10 mg BID added for mood and hallucinations. During his stay, his mood gradually improved. He no longer endorsed suicidal ideations, plans, or intent. He felt his depression and anxiety also improved. He was able to attend groups and socialize with his peers. He notes that he ultimately wants to go to a new group home closer to [place], [place]. During his stay, myself and team were able to stay in contact with his guardian  to update on hospital stay and changes in medications. She approved of discharge back to current group home. Medication changes relayed to group home, and pharmacy was verified to send prescriptions. CBC completed 07/21/2020 with ANC 2.7 K/uL. Results faxed to pharmacy for Clozapine REMs entry. At time of discharge patient denied suicidal ideations, homicidal ideations, visual hallucinations, and auditory hallucinations.   Physical Findings: AIMS: Facial and Oral Movements Muscles of Facial Expression: None, normal Lips and Perioral Area: None, normal Jaw: None, normal Tongue: None, normal,Extremity Movements Upper (arms, wrists, hands, fingers): None, normal Lower (legs, knees, ankles, toes): None, normal, Trunk Movements Neck, shoulders, hips: None, normal, Overall Severity Severity of abnormal movements (highest score from questions above): None, normal Incapacitation due to abnormal movements: None, normal Patient's awareness of abnormal movements (rate only patient's report): No Awareness, Dental Status Current problems with teeth and/or dentures?: No Does patient usually wear dentures?: No  CIWA:    COWS:     Musculoskeletal: Strength & Muscle Tone: within normal limits Gait & Station: normal Patient leans: N/A  Psychiatric Specialty Exam: Physical Exam Vitals and nursing note reviewed.  Constitutional:      Appearance: Normal appearance.  HENT:     Head: Normocephalic and atraumatic.     Right Ear: External ear normal.     Left Ear: External ear normal.     Nose: Nose normal.     Mouth/Throat:     Mouth: Mucous membranes are moist.     Pharynx: Oropharynx is clear.  Eyes:     Extraocular Movements: Extraocular movements intact.     Conjunctiva/sclera: Conjunctivae normal.     Pupils: Pupils are equal, round, and reactive to light.  Cardiovascular:     Rate and Rhythm: Normal rate and regular rhythm.     Pulses: Normal pulses.  Pulmonary:     Effort: Pulmonary effort  is normal.  Abdominal:     General: Abdomen is flat.     Palpations: Abdomen is soft.  Musculoskeletal:        General: No swelling. Normal range of motion.     Cervical back: Normal range of motion and neck supple.  Skin:    General: Skin is warm and dry.  Neurological:     General: No focal deficit present.     Mental Status: He is alert and oriented to person, place, and time.  Psychiatric:        Mood and Affect: Mood normal.        Behavior: Behavior normal.        Thought Content: Thought content normal.        Judgment: Judgment normal.     Review of Systems  Constitutional: Negative for appetite change and fatigue.  HENT: Negative for rhinorrhea and sore throat.   Eyes: Negative for photophobia and visual disturbance.  Respiratory: Negative for cough and shortness of breath.   Cardiovascular: Negative for chest pain and palpitations.  Gastrointestinal: Negative for constipation, diarrhea, nausea and vomiting.  Endocrine: Negative  for cold intolerance and heat intolerance.  Genitourinary: Negative for difficulty urinating and dysuria.  Musculoskeletal: Negative for arthralgias and joint swelling.  Skin: Negative for rash and wound.  Allergic/Immunologic: Negative for food allergies and immunocompromised state.  Neurological: Negative for dizziness and headaches.  Hematological: Negative for adenopathy. Does not bruise/bleed easily.  Psychiatric/Behavioral: Negative for behavioral problems, hallucinations and suicidal ideas.    Blood pressure 115/66, pulse 67, temperature (!) 97.5 F (36.4 C), temperature source Oral, resp. rate 18, height 5' 10.5" (1.791 m), weight 81.2 kg, SpO2 99 %.Body mass index is 25.32 kg/m.  General Appearance: Fairly Groomed  Patent attorney::  Good  Speech:  Clear and Coherent and Normal Rate409  Volume:  Normal  Mood:  Euthymic  Affect:  Congruent  Thought Process:  Coherent and Linear  Orientation:  Full (Time, Place, and Person)  Thought  Content:  Logical  Suicidal Thoughts:  No  Homicidal Thoughts:  No  Memory:  Immediate;   Fair Recent;   Fair Remote;   Fair  Judgement:  Intact  Insight:  Present  Psychomotor Activity:  Normal  Concentration:  Fair  Recall:  Fair  Fund of Knowledge:Fair  Language: Fair  Akathisia:  Negative  Handed:  Right  AIMS (if indicated):     Assets:  Communication Skills Desire for Improvement Financial Resources/Insurance Housing Leisure Time Physical Health Social Support  Sleep:  Number of Hours: 8.5  Cognition: WNL  ADL's:  Intact        Have you used any form of tobacco in the last 30 days? (Cigarettes, Smokeless Tobacco, Cigars, and/or Pipes): Yes  Has this patient used any form of tobacco in the last 30 days? (Cigarettes, Smokeless Tobacco, Cigars, and/or Pipes)  Yes, A prescription for an FDA-approved tobacco cessation medication was offered at discharge and the patient refused  Blood Alcohol level:  Lab Results  Component Value Date   [facility] <10 07/14/2020   ETH <10 01/04/2020    Metabolic Disorder Labs:  Lab Results  Component Value Date   HGBA1C 5.3 07/16/2020   MPG 105.41 07/16/2020   MPG 105.41 07/14/2020   Lab Results  Component Value Date   PROLACTIN 2.0 (L) 08/31/2016   PROLACTIN 4.8 05/22/2016   Lab Results  Component Value Date   CHOL 135 07/16/2020   TRIG 226 (H) 07/16/2020   HDL 37 (L) 07/16/2020   CHOLHDL 3.6 07/16/2020   VLDL 45 (H) 07/16/2020   LDLCALC 53 07/16/2020   LDLCALC UNABLE TO CALCULATE IF TRIGLYCERIDE OVER 400 mg/dL 88/89/1694    See Psychiatric Specialty Exam and Suicide Risk Assessment completed by Attending Physician prior to discharge.  Discharge destination:  Other:  group home  Is patient on multiple antipsychotic therapies at discharge:  Yes,   Do you recommend tapering to monotherapy for antipsychotics?  No   Has Patient had three or more failed trials of antipsychotic monotherapy by history:  Yes,   Antipsychotic  medications that previously failed include:   1.  Clozaril., 2.  Zyprexa. and 3.  Saphris 4. Geodon  Recommended Plan for Multiple Antipsychotic Therapies: NA  Discharge Instructions    Diet general   Complete by: As directed    Increase activity slowly   Complete by: As directed      Allergies as of 07/22/2020      Reactions   Penicillins Other (See Comments)   Reaction: "lockjaw" Has patient had a PCN reaction causing immediate rash, facial/tongue/throat swelling, SOB or lightheadedness with hypotension:  No Has patient had a PCN reaction causing severe rash involving mucus membranes or skin necrosis: No Has patient had a PCN reaction that required hospitalization: No Has patient had a PCN reaction occurring within the last 10 years: No If all of the above answers are "NO", then may proceed with Cephalosporin use.      Medication List    STOP taking these medications   ammonium lactate 12 % cream Commonly known as: AMLACTIN   NovoFine Autocover 30G X 8 MM Misc Generic drug: Insulin Pen Needle     TAKE these medications     Indication  albuterol 108 (90 Base) MCG/ACT inhaler Commonly known as: VENTOLIN HFA Inhale 1-2 puffs into the lungs every 6 (six) hours as needed for wheezing or shortness of breath.  Indication: Asthma   apixaban 5 MG Tabs tablet Commonly known as: ELIQUIS Take 1 tablet (5 mg total) by mouth 2 (two) times daily.  Indication: Blockage of Blood Vessel to Lung by a Particle   cloZAPine 100 MG tablet Commonly known as: CLOZARIL Take 5 tablets (500 mg total) by mouth at bedtime.  Indication: Schizophrenia   fluticasone furoate-vilanterol 200-25 MCG/INH Aepb Commonly known as: BREO ELLIPTA Inhale 1 puff into the lungs daily.  Indication: Asthma   FORA V30a Blood Glucose Test test strip Generic drug: glucose blood Use as instructed  Indication: Diabetes   Levemir FlexTouch 100 UNIT/ML FlexPen Generic drug: insulin detemir Inject into the  skin.  Indication: Type 2 Diabetes   lisinopril 2.5 MG tablet Commonly known as: ZESTRIL Take 1 tablet (2.5 mg total) by mouth daily.  Indication: High Blood Pressure Disorder   lithium carbonate 450 MG CR tablet Commonly known as: ESKALITH Take 1 tablet (450 mg total) by mouth every 12 (twelve) hours.  Indication: Schizoaffective Disorder   metFORMIN 1000 MG tablet Commonly known as: GLUCOPHAGE Take 1 tablet (1,000 mg total) by mouth 2 (two) times daily with a meal.  Indication: Type 2 Diabetes   OLANZapine 10 MG tablet Commonly known as: ZYPREXA Take 1 tablet (10 mg total) by mouth 2 (two) times daily.  Indication: Major Depressive Disorder   sertraline 50 MG tablet Commonly known as: ZOLOFT Take 1 tablet (50 mg total) by mouth daily.  Indication: Major Depressive Disorder, Obsessive Compulsive Disorder   simvastatin 40 MG tablet Commonly known as: ZOCOR Take 1 tablet (40 mg total) by mouth daily at 6 PM.  Indication: Nonfamilial Hypercholesterolemia   traZODone 100 MG tablet Commonly known as: DESYREL Take 1 tablet (100 mg total) by mouth at bedtime.  Indication: Trouble Sleeping       Follow-up Information    [facility] [place] [employer]. Follow up.   Why: Your ACTT team will contact you on the afternoon of 07/22/2020.  Thanks! Contact information: [address] [place] [address] [phone]               Follow-up recommendations:  Activity:  as tolerated Diet:  carb-modified, diabetic diet  Comments:  30-day scripts with 1 refill sent to [facility] per group home request. Will fax over CBC from Sunday for clozaril REMs update.   Signed: [name], MD 07/22/2020, 10:02 AM

## 2020-07-22 NOTE — Progress Notes (Signed)
  [facility] Adult Case Management Discharge Plan :  Will you be returning to the same living situation after discharge:  Yes,  pt returning to his group home.  At discharge, do you have transportation home?: Yes,  group home will provide transportation. Do you have the ability to pay for your medications: Yes,  Medicare  Release of information consent forms completed and in the chart;  Patient's signature needed at discharge.  Patient to Follow up at:  Follow-up Information    [address] [facility] & [place], [employer]. Follow up.   Why: Your ACTT team will contact you on the afternoon of 07/22/2020.  Thanks! Contact information: [address] [place] [address] [place] [address] [phone]               Next level of care provider has access to [place] Link:no  Safety Planning and Suicide Prevention discussed: Yes,  SPE completed with patient and guardian.  Have you used any form of tobacco in the last 30 days? (Cigarettes, Smokeless Tobacco, Cigars, and/or Pipes): Yes  Has patient been referred to the Quitline?: Patient refused referral  Patient has been referred for addiction treatment: Pt. refused referral  [name], LCSW 07/22/2020, 11:13 AM

## 2020-07-22 NOTE — [facility] Suicide Risk Assessment (Signed)
[facility] Discharge Suicide Risk Assessment   Principal Problem: Schizoaffective disorder, bipolar type [facility]) Discharge Diagnoses: Principal Problem:   Schizoaffective disorder, bipolar type (HCC) Active Problems:   Hypertension   Diabetes mellitus without complication (HCC)   Hyperlipidemia   History of pulmonary embolism   Tobacco use disorder   Total Time spent with patient: 35 minutes- 25 spent with patietn, 10 minutes coordinating discharge, charting, and presciptions  Musculoskeletal: Strength & Muscle Tone: within normal limits Gait & Station: normal Patient leans: N/A  Psychiatric Specialty Exam: Review of Systems  Blood pressure 115/66, pulse 67, temperature (!) 97.5 F (36.4 C), temperature source Oral, resp. rate 18, height 5' 10.5" (1.791 m), weight 81.2 kg, SpO2 99 %.Body mass index is 25.32 kg/m.  General Appearance: Fairly Groomed  Patent attorney::  Good  Speech:  Clear and Coherent and Normal Rate409  Volume:  Normal  Mood:  Euthymic  Affect:  Congruent  Thought Process:  Coherent and Linear  Orientation:  Full (Time, Place, and Person)  Thought Content:  Logical  Suicidal Thoughts:  No  Homicidal Thoughts:  No  Memory:  Immediate;   Fair Recent;   Fair Remote;   Fair  Judgement:  Intact  Insight:  Present  Psychomotor Activity:  Normal  Concentration:  Fair  Recall:  [employer] of Knowledge:Fair  Language: Fair  Akathisia:  Negative  Handed:  Right  AIMS (if indicated):     Assets:  Communication Skills Desire for Improvement Financial Resources/Insurance Housing Leisure Time Physical Health Social Support  Sleep:  Number of Hours: 8.5  Cognition: WNL  ADL's:  Intact   Mental Status Per Nursing Assessment::   On Admission:  Suicidal ideation indicated by patient  Demographic Factors:  Male and Caucasian  Loss Factors: NA  Historical Factors: Prior suicide attempts  Risk Reduction Factors:   Religious beliefs about death, Living with  another person, especially a relative, Positive social support, Positive therapeutic relationship and Positive coping skills or problem solving skills  Continued Clinical Symptoms:  Depression:   Impulsivity Schizophrenia:   Depressive state Previous Psychiatric Diagnoses and Treatments  Cognitive Features That Contribute To Risk:  None    Suicide Risk:  Minimal: No identifiable suicidal ideation.  Patients presenting with no risk factors but with morbid ruminations; may be classified as minimal risk based on the severity of the depressive symptoms   Follow-up Information    [address] [facility] & [place] [employer]. Follow up.   Why: Your ACTT team will contact you on the afternoon of 07/22/2020.  Thanks! Contact information: [address] [place] [address] [phone]               Plan Of Care/Follow-up recommendations:  Activity:  as tolerated Diet:  regular diet  [name], MD 07/22/2020, 9:59 AM

## 2020-07-22 NOTE — Progress Notes (Signed)
Patient denies SI/HI, denies A/V hallucinations. Patient verbalizes understanding of discharge instructions, follow up care and prescriptions. Patient given all belongings from [facility] locker. Patient escorted out by staff, transported by group home staff. 

## 2020-07-22 NOTE — Progress Notes (Signed)
Recreation Therapy Notes  INPATIENT RECREATION TR PLAN  Patient Details Name: Zachory Mangual MRN: [id number] DOB: 13-May-1976 Today's Date: 07/22/2020  Rec Therapy Plan Is patient appropriate for Therapeutic Recreation?: Yes Treatment times per week: at least 3 Estimated Length of Stay: 5-7 days TR Treatment/Interventions: Group participation (Comment)  Discharge Criteria Pt will be discharged from therapy if:: Discharged Treatment plan/goals/alternatives discussed and agreed upon by:: Patient/family  Discharge Summary Short term goals set: Patient will engage in groups without prompting or encouragement from LRT x3 group sessions within 5 recreation therapy group sessions Short term goals met: Complete Progress toward goals comments: Groups attended Which groups?: Social skills,Goal setting,Other (Comment) (Self-care, Relaxation) Reason goals not met: N/A Therapeutic equipment acquired: N/A Reason patient discharged from therapy: Discharge from hospital Pt/family agrees with progress & goals achieved: Yes Date patient discharged from therapy: 07/22/20   Irving Bloor 07/22/2020, 1:04 PM

## 2020-07-22 NOTE — Care Management Important Message (Addendum)
Important Message  Patient Details  Name: Maya Scholer MRN: [id number] Date of Birth: 08-Jul-1975   Medicare Important Message Given:  Other (see comment) (Reviewed with the patient's guardian, [name], [phone])   Still waiting for signature to be sent by guardian.  She reports that she does have the form.      [name], LCSW 07/22/2020, 9:38 AM

## 2020-07-22 NOTE — Tx Team (Signed)
Interdisciplinary Treatment and Diagnostic Plan Update  07/22/2020 Time of Session: 8:30 AM Ger Paladino MRN: [id number]  Principal Diagnosis: Schizoaffective disorder, bipolar type (HCC)  Secondary Diagnoses: Principal Problem:   Schizoaffective disorder, bipolar type (HCC) Active Problems:   Hypertension   Diabetes mellitus without complication (HCC)   Hyperlipidemia   History of pulmonary embolism   Tobacco use disorder   Current Medications:  Current Facility-Administered Medications  Medication Dose Route Frequency Provider Last Rate Last Admin  . acetaminophen (TYLENOL) tablet 650 mg  650 mg Oral Q6H PRN Clapacs, [name], MD      . albuterol (VENTOLIN HFA) 108 (90 Base) MCG/ACT inhaler 1-2 puff  1-2 puff Inhalation Q6H PRN [name], MD      . alum & mag hydroxide-simeth (MAALOX/MYLANTA) 200-200-20 MG/5ML suspension 30 mL  30 mL Oral Q4H PRN Clapacs, [name], MD      . apixaban (ELIQUIS) tablet 5 mg  5 mg Oral BID Clapacs, [name], MD   5 mg at 07/22/20 0751  . cloZAPine (CLOZARIL) tablet 500 mg  500 mg Oral QHS Clapacs, [name], MD   500 mg at 07/21/20 2103  . fluticasone furoate-vilanterol (BREO ELLIPTA) 200-25 MCG/INH 1 puff  1 puff Inhalation Daily [name], MD   1 puff at 07/22/20 0751  . hydrOXYzine (ATARAX/VISTARIL) tablet 50 mg  50 mg Oral TID PRN Clapacs, [name], MD   50 mg at 07/20/20 2107  . lisinopril (ZESTRIL) tablet 2.5 mg  2.5 mg Oral Daily Clapacs, [name], MD   2.5 mg at 07/22/20 0751  . lithium carbonate (ESKALITH) CR tablet 450 mg  450 mg Oral Q12H Clapacs, [name], MD   450 mg at 07/22/20 0751  . magnesium hydroxide (MILK OF MAGNESIA) suspension 30 mL  30 mL Oral Daily PRN Clapacs, [name], MD      . metFORMIN (GLUCOPHAGE) tablet 1,000 mg  1,000 mg Oral BID WC Clapacs, [name], MD   1,000 mg at 07/22/20 0751  . nicotine polacrilex (NICORETTE) gum 2 mg  2 mg Oral Q4H PRN [name], MD   2 mg at 07/22/20 0848  . OLANZapine (ZYPREXA) tablet 10  mg  10 mg Oral BID Clapacs, [name], MD   10 mg at 07/22/20 0751  . sertraline (ZOLOFT) tablet 100 mg  100 mg Oral Daily Thalia Party, MD   100 mg at 07/22/20 0751  . simvastatin (ZOCOR) tablet 40 mg  40 mg Oral q1800 Clapacs, [name], MD   40 mg at 07/21/20 1700  . traZODone (DESYREL) tablet 100 mg  100 mg Oral QHS Clapacs, [name], MD   100 mg at 07/21/20 2103  . traZODone (DESYREL) tablet 100 mg  100 mg Oral QHS PRN Clapacs, [name], MD   100 mg at 07/21/20 2103   PTA Medications: Medications Prior to Admission  Medication Sig Dispense Refill Last Dose  . albuterol (VENTOLIN HFA) 108 (90 Base) MCG/ACT inhaler Inhale 1-2 puffs into the lungs every 6 (six) hours as needed for wheezing or shortness of breath. 18 g 1   . ammonium lactate (AMLACTIN) 12 % cream APPY TOPICALLY AS NEEDED FOR DRY SKIN 385 g 10   . apixaban (ELIQUIS) 5 MG TABS tablet Take 1 tablet (5 mg total) by mouth 2 (two) times daily. 60 tablet 6   . cloZAPine (CLOZARIL) 100 MG tablet Take 5 tablets (500 mg total) by mouth at bedtime. 150 tablet 1   .  fluticasone furoate-vilanterol (BREO ELLIPTA) 200-25 MCG/INH AEPB Inhale 1 puff into the lungs daily. 30 each 6   . glucose blood (FORA V30A BLOOD GLUCOSE TEST) test strip Use as instructed 100 each 12   . LEVEMIR FLEXTOUCH 100 UNIT/ML FlexPen Inject into the skin.     Marland Kitchen lisinopril (ZESTRIL) 2.5 MG tablet Take 1 tablet (2.5 mg total) by mouth daily. 30 tablet 1   . lithium carbonate (ESKALITH) 450 MG CR tablet Take 1 tablet (450 mg total) by mouth every 12 (twelve) hours. 60 tablet 1   . metFORMIN (GLUCOPHAGE) 1000 MG tablet Take 1 tablet (1,000 mg total) by mouth 2 (two) times daily with a meal. 60 tablet 1   . NOVOFINE AUTOCOVER 30G X 8 MM MISC      . sertraline (ZOLOFT) 50 MG tablet Take 1 tablet (50 mg total) by mouth daily. 30 tablet 1   . simvastatin (ZOCOR) 40 MG tablet Take 1 tablet (40 mg total) by mouth daily at 6 PM. 30 tablet 6   . traZODone (DESYREL) 100 MG tablet Take 1  tablet (100 mg total) by mouth at bedtime. 30 tablet 1     Patient Stressors: Financial difficulties Medication change or noncompliance  Patient Strengths: Average or above average Radio producer for treatment/growth Physical Health  Treatment Modalities: Medication Management, Group therapy, Case management,  1 to 1 session with clinician, Psychoeducation, Recreational therapy.   Physician Treatment Plan for Primary Diagnosis: Schizoaffective disorder, bipolar type (HCC) Long Term Goal(s): Improvement in symptoms so as ready for discharge Improvement in symptoms so as ready for discharge   Short Term Goals: Ability to identify changes in lifestyle to reduce recurrence of condition will improve Ability to verbalize feelings will improve Ability to disclose and discuss suicidal ideas Ability to demonstrate self-control will improve Ability to identify and develop effective coping behaviors will improve Compliance with prescribed medications will improve Ability to identify changes in lifestyle to reduce recurrence of condition will improve Ability to verbalize feelings will improve Ability to disclose and discuss suicidal ideas Ability to demonstrate self-control will improve Ability to identify and develop effective coping behaviors will improve Compliance with prescribed medications will improve  Medication Management: Evaluate patient's response, side effects, and tolerance of medication regimen.  Therapeutic Interventions: 1 to 1 sessions, Unit Group sessions and Medication administration.  Evaluation of Outcomes: Progressing  Physician Treatment Plan for Secondary Diagnosis: Principal Problem:   Schizoaffective disorder, bipolar type (HCC) Active Problems:   Hypertension   Diabetes mellitus without complication (HCC)   Hyperlipidemia   History of pulmonary embolism   Tobacco use disorder  Long Term Goal(s): Improvement in symptoms so as  ready for discharge Improvement in symptoms so as ready for discharge   Short Term Goals: Ability to identify changes in lifestyle to reduce recurrence of condition will improve Ability to verbalize feelings will improve Ability to disclose and discuss suicidal ideas Ability to demonstrate self-control will improve Ability to identify and develop effective coping behaviors will improve Compliance with prescribed medications will improve Ability to identify changes in lifestyle to reduce recurrence of condition will improve Ability to verbalize feelings will improve Ability to disclose and discuss suicidal ideas Ability to demonstrate self-control will improve Ability to identify and develop effective coping behaviors will improve Compliance with prescribed medications will improve     Medication Management: Evaluate patient's response, side effects, and tolerance of medication regimen.  Therapeutic Interventions: 1 to 1 sessions, Unit Group sessions and Medication  administration.  Evaluation of Outcomes: Progressing   RN Treatment Plan for Primary Diagnosis: Schizoaffective disorder, bipolar type (HCC) Long Term Goal(s): Knowledge of disease and therapeutic regimen to maintain health will improve  Short Term Goals: Ability to demonstrate self-control, Ability to participate in decision making will improve, Ability to verbalize feelings will improve, Ability to disclose and discuss suicidal ideas, Ability to identify and develop effective coping behaviors will improve and Compliance with prescribed medications will improve  Medication Management: RN will administer medications as ordered by provider, will assess and evaluate patient's response and provide education to patient for prescribed medication. RN will report any adverse and/or side effects to prescribing provider.  Therapeutic Interventions: 1 on 1 counseling sessions, Psychoeducation, Medication administration, Evaluate  responses to treatment, Monitor vital signs and CBGs as ordered, Perform/monitor CIWA, COWS, AIMS and Fall Risk screenings as ordered, Perform wound care treatments as ordered.  Evaluation of Outcomes: Progressing   LCSW Treatment Plan for Primary Diagnosis: Schizoaffective disorder, bipolar type (HCC) Long Term Goal(s): Safe transition to appropriate next level of care at discharge, Engage patient in therapeutic group addressing interpersonal concerns.  Short Term Goals: Engage patient in aftercare planning with referrals and resources, Increase social support, Increase ability to appropriately verbalize feelings, Increase emotional regulation, Facilitate acceptance of mental health diagnosis and concerns and Increase skills for wellness and recovery  Therapeutic Interventions: Assess for all discharge needs, 1 to 1 time with Social worker, Explore available resources and support systems, Assess for adequacy in community support network, Educate family and significant other(s) on suicide prevention, Complete Psychosocial Assessment, Interpersonal group therapy.  Evaluation of Outcomes: Progressing   Progress in Treatment: Attending groups: Yes. Participating in groups: Yes. Taking medication as prescribed: Yes. Toleration medication: Yes. Family/Significant other contact made: Yes, individual(s) contacted:  SPE completed with the pt's guardian Patient understands diagnosis: Yes. Discussing patient identified problems/goals with staff: Yes. Medical problems stabilized or resolved: Yes. Denies suicidal/homicidal ideation: Yes. Issues/concerns per patient self-inventory: No. Other: none  New problem(s) identified: No, Describe:  none  New Short Term/Long Term Goal(s):  detox, elimination of symptoms of psychosis, medication management for mood stabilization; elimination of SI thoughts; development of comprehensive mental wellness/sobriety plan.  Update 07/22/2020:  No changes at this time.    Patient Goals:  Been staying at the group home for the past 2 years and it's been hard the whole time, I was hoping you could find somewhere else"  Update 07/22/2020:  No changes at this time.   Discharge Plan or Barriers: CSW will assist patient in developing an appropriate aftercare plan.  Per guardian plan is for patient to return to group home and continue with current ACTT services. Update 07/22/2020:  Pt's ACTT team reports that they will follow up with the patient on 07/22/2020.  Patient is returning to his group home.   Reason for Continuation of Hospitalization: Anxiety Depression Medication stabilization Suicidal ideation  Estimated Length of Stay:  1-7 days  Recreational Therapy: Patient Stressors: N/A Patient Goal: Patient will engage in groups without prompting or encouragement from LRT x3 group sessions within 5 recreation therapy group sessions.  Attendees: Patient: Lance [name] 07/22/2020 9:29 AM  Physician: Dr. Neale Burly, MD 07/22/2020 9:29 AM  Nursing: Cecille Amsterdam, RN 07/22/2020 9:29 AM  RN Care Manager: 07/22/2020 9:29 AM  Social Worker: Penni Homans, MSW, LCSW 07/22/2020 9:29 AM  Recreational Therapist: Garret Reddish, Drue Flirt, LRT 07/22/2020 9:29 AM  Other: Jillyn Hidden, LCSW 07/22/2020 9:29 AM  Other:  07/22/2020 9:29 AM  Other: 07/22/2020 9:29 AM    Scribe for Treatment Team: [name], LCSW 07/22/2020 9:29 AM

## 2020-07-23 ENCOUNTER — Telehealth: Payer: Self-pay

## 2020-07-23 NOTE — Telephone Encounter (Signed)
Transition Care Management Unsuccessful Follow-up Telephone Call  Date of discharge and from where:  07/22/20 from [facility] Attempts:  1st Attempt  Reason for unsuccessful TCM follow-up call:  Left voice message  Left message with [facility] that is taking care of patient

## 2020-07-24 NOTE — Telephone Encounter (Signed)
Transition Care Management Unsuccessful Follow-up Telephone Call  Date of discharge and from where:  07/22/20 from [facility]  Attempts:  2nd Attempt  Reason for unsuccessful TCM follow-up call:  Left voice message

## 2020-07-25 NOTE — Telephone Encounter (Signed)
Transition Care Management Unsuccessful Follow-up Telephone Call  Date of discharge and from where:  07/22/20 from [facility]  Attempts:  3rd Attempt  Reason for unsuccessful TCM follow-up call:  Unable to reach patient

## 2020-07-26 NOTE — BHH Counselor (Signed)
CSW contacted group home [phone]) to follow up regarding watch. They stated that it was his watch (describing it as a black watch) and asked how to go about getting it. CSW explained that someone could come pick it up and that they would just need to call the nurses' station. They were given the number [phone]) to contact the nurses' station. No other concerns expressed. Contact ended without incident.  CSW attempted to contact [name] (guardian at [phone]) to follow up regarding the important message from medicare, of which a signed copy had not been received. Contact unable to be established. HIPPA complaint voicemail left with contact information for follow up.   Vilma Meckel. Algis Greenhouse, MSW, LCSW, [name] 07/26/2020 3:52 PM

## 2020-09-02 ENCOUNTER — Other Ambulatory Visit: Payer: Self-pay

## 2020-09-02 ENCOUNTER — Encounter: Payer: Self-pay | Admitting: Podiatry

## 2020-09-02 ENCOUNTER — Ambulatory Visit (INDEPENDENT_AMBULATORY_CARE_PROVIDER_SITE_OTHER): Payer: Medicare Other | Admitting: Podiatry

## 2020-09-02 DIAGNOSIS — Z7901 Long term (current) use of anticoagulants: Secondary | ICD-10-CM | POA: Diagnosis not present

## 2020-09-02 DIAGNOSIS — Q828 Other specified congenital malformations of skin: Secondary | ICD-10-CM

## 2020-09-02 DIAGNOSIS — B351 Tinea unguium: Secondary | ICD-10-CM | POA: Diagnosis not present

## 2020-09-02 DIAGNOSIS — M79676 Pain in unspecified toe(s): Secondary | ICD-10-CM

## 2020-09-02 DIAGNOSIS — E119 Type 2 diabetes mellitus without complications: Secondary | ICD-10-CM

## 2020-09-02 NOTE — Progress Notes (Signed)
This patient returns to my office for at risk foot care.  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  Patient is taking eliquiss.  . This patient is unable to cut nails himself since the patient cannot reach his nails.These nails are painful walking and wearing shoes.  This patient presents for at risk foot care today.  General Appearance  Alert, conversant and in no acute stress.  Vascular  Dorsalis pedis and posterior tibial  pulses are palpable  bilaterally.  Capillary return is within normal limits  bilaterally. Temperature is within normal limits  bilaterally.  Neurologic  Senn-Weinstein monofilament wire test within normal limits  bilaterally. Muscle power within normal limits bilaterally.  Nails Thick disfigured discolored nails with subungual debris  from hallux to fifth toes bilaterally. No evidence of bacterial infection or drainage bilaterally. Nail spicules noted hallux nails  B/L.  Orthopedic  No limitations of motion  feet .  No crepitus or effusions noted.  No bony pathology or digital deformities noted.  Skin  normotropic skin  noted bilaterally.  No signs of infections or ulcers noted.  Porokeratosis sub 4 right foot.  Onychomycosis  Pain in right toes  Pain in left toes    Consent was obtained for treatment procedures.   Mechanical debridement of nails 1-5  bilaterally performed with a nail nipper.  Filed with dremel without incident. Debride porokeratosis sub 4  With # 15 blade.   Return office visit   4 months                   Told patient to return for periodic foot care and evaluation due to potential at risk complications.   Helane Gunther DPM

## 2020-09-19 ENCOUNTER — Emergency Department (EMERGENCY_DEPARTMENT_HOSPITAL)
Admission: EM | Admit: 2020-09-19 | Discharge: 2020-09-20 | Disposition: A | Discharge status: Other Acute Inpatient Hospital | Payer: Medicare Other | Source: Home / Self Care | Attending: Emergency Medicine | Admitting: Emergency Medicine

## 2020-09-19 ENCOUNTER — Other Ambulatory Visit: Payer: Self-pay

## 2020-09-19 DIAGNOSIS — E785 Hyperlipidemia, unspecified: Secondary | ICD-10-CM | POA: Diagnosis present

## 2020-09-19 DIAGNOSIS — R45851 Suicidal ideations: Secondary | ICD-10-CM | POA: Diagnosis not present

## 2020-09-19 DIAGNOSIS — F319 Bipolar disorder, unspecified: Secondary | ICD-10-CM | POA: Diagnosis present

## 2020-09-19 DIAGNOSIS — I10 Essential (primary) hypertension: Secondary | ICD-10-CM | POA: Diagnosis present

## 2020-09-19 DIAGNOSIS — F1010 Alcohol abuse, uncomplicated: Secondary | ICD-10-CM | POA: Diagnosis present

## 2020-09-19 DIAGNOSIS — F250 Schizoaffective disorder, bipolar type: Secondary | ICD-10-CM | POA: Diagnosis present

## 2020-09-19 DIAGNOSIS — F17200 Nicotine dependence, unspecified, uncomplicated: Secondary | ICD-10-CM | POA: Diagnosis present

## 2020-09-19 DIAGNOSIS — F251 Schizoaffective disorder, depressive type: Secondary | ICD-10-CM | POA: Diagnosis not present

## 2020-09-19 DIAGNOSIS — E119 Type 2 diabetes mellitus without complications: Secondary | ICD-10-CM

## 2020-09-19 DIAGNOSIS — Z7901 Long term (current) use of anticoagulants: Secondary | ICD-10-CM

## 2020-09-19 LAB — ETHANOL: Alcohol, Ethyl (B): <10 mg/dL (ref <10)

## 2020-09-19 LAB — COMPREHENSIVE METABOLIC PANEL
ALT: 19 U/L (ref 0–44)
AST: 18 U/L (ref 15–41)
Albumin: 4.6 g/dL (ref 3.5–5.0)
Alkaline Phosphatase: 67 U/L (ref 38–126)
Anion gap: 10 (ref 5–15)
BUN: 21 mg/dL — ABNORMAL HIGH (ref 6–20)
CO2: 21 mmol/L — ABNORMAL LOW (ref 22–32)
Calcium: 9.7 mg/dL (ref 8.9–10.3)
Chloride: 107 mmol/L (ref 98–111)
Creatinine, Ser: 0.94 mg/dL (ref 0.61–1.24)
GFR, Estimated: >60 mL/min (ref >60)
Glucose, Bld: 206 mg/dL — ABNORMAL HIGH (ref 70–99)
Potassium: 3.6 mmol/L (ref 3.5–5.1)
Sodium: 138 mmol/L (ref 135–145)
Total Bilirubin: 0.4 mg/dL (ref 0.3–1.2)
Total Protein: 6.9 g/dL (ref 6.5–8.1)

## 2020-09-19 LAB — URINE DRUG SCREEN, QUALITATIVE (ARMC ONLY)
Amphetamines, Ur Screen: NOT DETECTED
Barbiturates, Ur Screen: NOT DETECTED
Benzodiazepine, Ur Scrn: NOT DETECTED
Cannabinoid 50 Ng, Ur ~~LOC~~: NOT DETECTED
Cocaine Metabolite,Ur ~~LOC~~: NOT DETECTED
MDMA (Ecstasy)Ur Screen: NOT DETECTED
Methadone Scn, Ur: NOT DETECTED
Opiate, Ur Screen: NOT DETECTED
Phencyclidine (PCP) Ur S: NOT DETECTED
Tricyclic, Ur Screen: POSITIVE — AB

## 2020-09-19 LAB — CBC
HCT: 43 % (ref 39.0–52.0)
Hemoglobin: 15.3 g/dL (ref 13.0–17.0)
MCH: 32.8 pg (ref 26.0–34.0)
MCHC: 35.6 g/dL (ref 30.0–36.0)
MCV: 92.1 fL (ref 80.0–100.0)
Platelets: 216 10*3/uL (ref 150–400)
RBC: 4.67 MIL/uL (ref 4.22–5.81)
RDW: 12.8 % (ref 11.5–15.5)
WBC: 5.4 10*3/uL (ref 4.0–10.5)
nRBC: 0 % (ref 0.0–0.2)

## 2020-09-19 LAB — RESP PANEL BY RT-PCR (FLU A&B, COVID) ARPGX2
Influenza A by PCR: NEGATIVE
Influenza B by PCR: NEGATIVE
SARS Coronavirus 2 by RT PCR: NEGATIVE

## 2020-09-19 LAB — LITHIUM LEVEL: Lithium Lvl: 1.12 mmol/L (ref 0.60–1.20)

## 2020-09-19 LAB — SALICYLATE LEVEL: Salicylate Lvl: <7 mg/dL — ABNORMAL LOW (ref 7.0–30.0)

## 2020-09-19 LAB — ACETAMINOPHEN LEVEL: Acetaminophen (Tylenol), Serum: <10 ug/mL — ABNORMAL LOW (ref 10–30)

## 2020-09-19 MED ORDER — TRAZODONE HCL 100 MG PO TABS
100.0000 mg | ORAL_TABLET | Freq: Every day | ORAL | Status: DC
Start: 2020-09-19 — End: 2020-09-20
  Administered 2020-09-19: 100 mg via ORAL
  Filled 2020-09-19: qty 1

## 2020-09-19 MED ORDER — SERTRALINE HCL 50 MG PO TABS
50.0000 mg | ORAL_TABLET | Freq: Every day | ORAL | Status: DC
  Administered 2020-09-19 – 2020-09-20 (×2): 50 mg via ORAL
  Filled 2020-09-19 (×2): qty 1

## 2020-09-19 MED ORDER — CLOZAPINE 100 MG PO TABS
500.0000 mg | ORAL_TABLET | Freq: Every day | ORAL | Status: DC
  Administered 2020-09-20: 500 mg via ORAL
  Filled 2020-09-19: qty 5

## 2020-09-19 MED ORDER — FLUTICASONE FUROATE-VILANTEROL 200-25 MCG/INH IN AEPB
1.0000 | INHALATION_SPRAY | Freq: Every day | RESPIRATORY_TRACT | Status: DC
Start: 2020-09-19 — End: 2020-09-20
  Administered 2020-09-19 – 2020-09-20 (×2): 1 via RESPIRATORY_TRACT
  Filled 2020-09-19 (×2): qty 28

## 2020-09-19 MED ORDER — LISINOPRIL 5 MG PO TABS
2.5000 mg | ORAL_TABLET | Freq: Every day | ORAL | Status: DC
  Administered 2020-09-19 – 2020-09-20 (×2): 2.5 mg via ORAL
  Filled 2020-09-19 (×2): qty 1

## 2020-09-19 MED ORDER — ALBUTEROL SULFATE HFA 108 (90 BASE) MCG/ACT IN AERS
1.0000 | INHALATION_SPRAY | Freq: Four times a day (QID) | RESPIRATORY_TRACT | Status: DC | PRN
Start: 2020-09-19 — End: 2020-09-20
  Filled 2020-09-19: qty 6.7

## 2020-09-19 MED ORDER — METFORMIN HCL 500 MG PO TABS
1000.0000 mg | ORAL_TABLET | Freq: Two times a day (BID) | ORAL | Status: DC
  Administered 2020-09-20: 1000 mg via ORAL
  Filled 2020-09-19: qty 2

## 2020-09-19 MED ORDER — LITHIUM CARBONATE ER 450 MG PO TBCR
450.0000 mg | EXTENDED_RELEASE_TABLET | Freq: Two times a day (BID) | ORAL | Status: DC
  Administered 2020-09-19 – 2020-09-20 (×2): 450 mg via ORAL
  Filled 2020-09-19 (×2): qty 1

## 2020-09-19 MED ORDER — OLANZAPINE 10 MG PO TABS
10.0000 mg | ORAL_TABLET | Freq: Two times a day (BID) | ORAL | Status: DC
  Administered 2020-09-19 – 2020-09-20 (×2): 10 mg via ORAL
  Filled 2020-09-19 (×2): qty 1

## 2020-09-19 MED ORDER — SIMVASTATIN 40 MG PO TABS
40.0000 mg | ORAL_TABLET | Freq: Every day | ORAL | Status: DC
Start: 2020-09-20 — End: 2020-09-20
  Filled 2020-09-19: qty 1

## 2020-09-19 MED ORDER — APIXABAN 5 MG PO TABS
5.0000 mg | ORAL_TABLET | Freq: Two times a day (BID) | ORAL | Status: DC
Start: 2020-09-19 — End: 2020-09-20
  Administered 2020-09-19 – 2020-09-20 (×2): 5 mg via ORAL
  Filled 2020-09-19: qty 1

## 2020-09-19 MED ORDER — APIXABAN 5 MG PO TABS
5.0000 mg | ORAL_TABLET | Freq: Two times a day (BID) | ORAL | Status: DC
  Filled 2020-09-19 (×2): qty 1

## 2020-09-19 NOTE — ED Notes (Signed)
Report received from [facility]. Patient to be transferred to [facility] room 1.

## 2020-09-19 NOTE — ED Triage Notes (Signed)
Pt was seen at [facility] today and told staff that he was suicidal. Pt states in triage that he has a plan to cut his wrists, pt also states that he is homicidal. Pt is under IVC from [facility]. Pt calm and cooperative in triage.

## 2020-09-19 NOTE — ED Notes (Signed)
Pt reports SI with plan to "do anything I can", does contract for safety with this nurse. Pt reports HI toward member at [facility] but has gone away since arrival to [facility]. States he has VH of electricity at times.

## 2020-09-19 NOTE — ED Notes (Signed)
Pt. To BHU from ED ambulatory without difficulty, to room  BHU 1. Report from [facility]. Pt. Is alert and oriented, warm and dry in no distress. Pt. Denies SI, HI, and AVH. Pt. Calm and cooperative. Pt. Made aware of security cameras and Q15 minute rounds. Diet soda and snadwich tray given. Pt. Encouraged to let Nursing staff know of any concerns or needs.

## 2020-09-19 NOTE — ED Provider Notes (Signed)
[facility] Emergency Department Provider Note   ____________________________________________   Event Date/Time   First MD Initiated Contact with Patient 09/19/20 1910     (approximate)  I have reviewed the triage vital signs and the nursing notes.   HISTORY  Chief Complaint IVC    HPI Lance [name] is a 45 y.o. male with past medical history of hypertension, hyperlipidemia, diabetes, lupus anticoagulant, PE on Eliquis, and schizophrenia who presents to the ED for psychiatric evaluation.  Patient reports that he has been having increasing thoughts of suicide over the past couple of weeks.  He describes a plan to cut his wrists, but he has not yet attempted to do so.  He spoke with his ACT team counselor earlier today and was referred to [facility], subsequently placed under IVC and sent to the ED for potential psychiatric admission.  Patient denies any medical complaints at this time, states he has been compliant with his medications.  He denies any alcohol or drug abuse.        Past Medical History:  Diagnosis Date  . Depression   . Diabetes mellitus without complication (HCC)   . Hyperlipidemia   . Hypertension   . Lupus anticoagulant disorder (HCC) 06/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  . PE (pulmonary thromboembolism) (HCC)   . Schizo affective schizophrenia (HCC)   . Supratherapeutic INR 11/19/2016    Patient Active Problem List   Diagnosis Date Noted  . Alcohol abuse 04/12/2018  . Tobacco use disorder 09/01/2016  . Schizoaffective disorder, bipolar type (HCC) 05/22/2016  . Diabetes mellitus without complication (HCC) 05/21/2016  . Hyperlipidemia 05/21/2016  . History of pulmonary embolism 05/21/2016  . Chronic anticoagulation 05/21/2016  . Hypertension 09/25/2015    History reviewed. No pertinent surgical history.  Prior to Admission medications   Medication Sig Start Date End Date Taking? Authorizing Provider  albuterol  (VENTOLIN HFA) 108 (90 Base) MCG/ACT inhaler Inhale 1-2 puffs into the lungs every 6 (six) hours as needed for wheezing or shortness of breath. 11/14/19   Clapacs, [name], MD  apixaban (ELIQUIS) 5 MG TABS tablet Take 1 tablet (5 mg total) by mouth 2 (two) times daily. 04/29/20   Corky Downs, MD  cloZAPine (CLOZARIL) 100 MG tablet Take 5 tablets (500 mg total) by mouth at bedtime. 07/22/20   [name], MD  fluticasone furoate-vilanterol (BREO ELLIPTA) 200-25 MCG/INH AEPB Inhale 1 puff into the lungs daily. 11/23/19   Corky Downs, MD  glucose blood (FORA V30A BLOOD GLUCOSE TEST) test strip Use as instructed 01/25/20   Corky Downs, MD  LEVEMIR FLEXTOUCH 100 UNIT/ML FlexPen Inject into the skin. 01/24/20   [provider]  lisinopril (ZESTRIL) 2.5 MG tablet Take 1 tablet (2.5 mg total) by mouth daily. 11/14/19   Clapacs, [name], MD  lithium carbonate (ESKALITH) 450 MG CR tablet Take 1 tablet (450 mg total) by mouth every 12 (twelve) hours. 07/22/20   [name], MD  metFORMIN (GLUCOPHAGE) 1000 MG tablet Take 1 tablet (1,000 mg total) by mouth 2 (two) times daily with a meal. 11/14/19   Clapacs, [name], MD  OLANZapine (ZYPREXA) 10 MG tablet Take 1 tablet (10 mg total) by mouth 2 (two) times daily. 07/22/20   [name], MD  sertraline (ZOLOFT) 50 MG tablet Take 1 tablet (50 mg total) by mouth daily. 07/22/20   [name], MD  simvastatin (ZOCOR) 40 MG tablet Take 1 tablet (40 mg  total) by mouth daily at 6 PM. 11/23/19   Corky Downs, MD  traZODone (DESYREL) 100 MG tablet Take 1 tablet (100 mg total) by mouth at bedtime. 07/22/20   [name], MD    Allergies Penicillins  Family History  Problem Relation Age of Onset  . CAD Mother   . CAD Sister     Social History Social History   Tobacco Use  . Smoking status: Current Every Day Smoker    Packs/day: 1.00    Years: 23.00    Pack years: 23.00    Types: Cigarettes  . Smokeless tobacco: Never Used  . Tobacco  comment: will provide material  Substance Use Topics  . Alcohol use: No    Comment: occassionally  . Drug use: Not Currently    Types: Marijuana    Comment: Last use 11/29/16    Review of Systems  Constitutional: No fever/chills Eyes: No visual changes. ENT: No sore throat. Cardiovascular: Denies chest pain. Respiratory: Denies shortness of breath. Gastrointestinal: No abdominal pain.  No nausea, no vomiting.  No diarrhea.  No constipation. Genitourinary: Negative for dysuria. Musculoskeletal: Negative for back pain. Skin: Negative for rash. Neurological: Negative for headaches, focal weakness or numbness.  Positive for suicidal ideation.  ____________________________________________   PHYSICAL EXAM:  VITAL SIGNS: ED Triage Vitals  Enc Vitals Group     BP 09/19/20 1855 (!) 148/79     Pulse Rate 09/19/20 1855 81     Resp 09/19/20 1855 (!) 22     Temp 09/19/20 1855 98.3 F (36.8 C)     Temp Source 09/19/20 1855 Oral     SpO2 09/19/20 1855 99 %     Weight 09/19/20 1855 190 lb (86.2 kg)     Height 09/19/20 1855 5' 10.5" (1.791 m)     Head Circumference --      Peak Flow --      Pain Score 09/19/20 1900 0     Pain Loc --      Pain Edu? --      Excl. in GC? --     Constitutional: Alert and oriented. Eyes: Conjunctivae are normal. Head: Atraumatic. Nose: No congestion/rhinnorhea. Mouth/Throat: Mucous membranes are moist. Neck: Normal ROM Cardiovascular: Normal rate, regular rhythm. Grossly normal heart sounds. Respiratory: Normal respiratory effort.  No retractions. Lungs CTAB. Gastrointestinal: Soft and nontender. No distention. Genitourinary: deferred Musculoskeletal: No lower extremity tenderness nor edema. Neurologic:  Normal speech and language. No gross focal neurologic deficits are appreciated. Skin:  Skin is warm, dry and intact. No rash noted. Psychiatric: Mood and affect are normal. Speech and behavior are  normal.  ____________________________________________   LABS (all labs ordered are listed, but only abnormal results are displayed)  Labs Reviewed  COMPREHENSIVE METABOLIC PANEL - Abnormal; Notable for the following components:      Result Value   CO2 21 (*)    Glucose, Bld 206 (*)    BUN 21 (*)    All other components within normal limits  RESP PANEL BY RT-PCR (FLU A&B, COVID) ARPGX2  ETHANOL  CBC  SALICYLATE LEVEL  ACETAMINOPHEN LEVEL  URINE DRUG SCREEN, QUALITATIVE ([facility] ONLY)  LITHIUM LEVEL    PROCEDURES  Procedure(s) performed (including Critical Care):  Procedures   ____________________________________________   INITIAL IMPRESSION / ASSESSMENT AND PLAN / ED COURSE       45 year old male with past medical history of hypertension, hyperlipidemia, diabetes, lupus anticoagulant, PE on Eliquis, and schizophrenia who presents to the ED for psychiatric  evaluation due to worsening suicidal ideation.  He arrives under IVC, but is calm and cooperative at this time.  He denies any medical complaints and screening labs are unremarkable.  Patient may be medically cleared for psychiatric disposition.  The patient has been placed in psychiatric observation due to the need to provide a safe environment for the patient while obtaining psychiatric consultation and evaluation, as well as ongoing medical and medication management to treat the patient's condition.  The patient has been placed under full IVC at this time.       ____________________________________________   FINAL CLINICAL IMPRESSION(S) / ED DIAGNOSES  Final diagnoses:  Suicidal ideation     ED Discharge Orders    None       Note:  This document was prepared using Dragon voice recognition software and may include unintentional dictation errors.   Chesley Noon, MD 09/19/20 1945

## 2020-09-19 NOTE — Consult Note (Signed)
[facility] Face-to-Face Psychiatry Consult   Reason for Consult: IVC Referring Physician: Dr. Larinda Buttery Patient Identification: Lance [name] MRN:  [id number] Principal Diagnosis: <principal problem not specified> Diagnosis:  Active Problems:   Hypertension   Diabetes mellitus without complication (HCC)   Hyperlipidemia   Chronic anticoagulation   Schizoaffective disorder, bipolar type (HCC)   Tobacco use disorder   Alcohol abuse   Total Time spent with patient: 20 minutes  Subjective: " The doctor said I would be here overnight and go somewhere for a longer term care." Lance [name] is a 45 y.o. male patient presented to [facility] ED via law enforcement under involuntary commitment status (IVC) by way off [facility].  Per the ED triage nurse note, Pt was seen at [facility] today and told staff that he was suicidal. Pt states in triage that he has a plan to cut his wrists, pt also states that he is homicidal. Pt is under IVC from [facility]. Pt calm and cooperative in triage. The patient is seen resting calmly in bed.  The patient voiced the doctor said "I will be here overnight and be sent to a long-term hospital  for me to get help for my mental illness."  The patient states he currently resides in a care home and voice he does not like it. The patient was seen face-to-face by this provider; the chart was reviewed and consulted with Dr.Jessup on 09/19/2020 due to the patient's care. It was discussed with the EDP that the patient does meet the criteria to be admitted to the psychiatric inpatient unit.  On evaluation, the patient is alert and oriented x 4, calm, cooperative, and mood-congruent with affect. The patient does not appear to be responding to internal or external stimuli. Neither is the patient presenting with any delusional thinking. The patient denies auditory hallucinations. The patient denies homicidal or self-harm ideations but admits to suicidal ideations and visual hallucinations. The patient is not  presenting with any psychotic or paranoid behaviors. During an encounter with the patient, he was able to answer questions appropriately.  HPI: Per Dr. Larinda Buttery, Lance [name] is a 45 y.o. male with past medical history of hypertension, hyperlipidemia, diabetes, lupus anticoagulant, PE on Eliquis, and schizophrenia who presents to the ED for psychiatric evaluation.  Patient reports that he has been having increasing thoughts of suicide over the past couple of weeks.  He describes a plan to cut his wrists, but he has not yet attempted to do so.  He spoke with his ACT team counselor earlier today and was referred to [facility], subsequently placed under IVC and sent to the ED for potential psychiatric admission.  Patient denies any medical complaints at this time, states he has been compliant with his medications.  He denies any alcohol or drug abuse.  Past Psychiatric History:  Schizo affective schizophrenia (HCC) Depression  Risk to Self:  Yes Risk to Others:  No Prior Inpatient Therapy:   Yes Prior Outpatient Therapy:   Yes  Past Medical History:  Past Medical History:  Diagnosis Date  . Depression   . Diabetes mellitus without complication (HCC)   . Hyperlipidemia   . Hypertension   . Lupus anticoagulant disorder (HCC) 06/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  . PE (pulmonary thromboembolism) (HCC)   . Schizo affective schizophrenia (HCC)   . Supratherapeutic INR 11/19/2016   History reviewed. No pertinent surgical history. Family History:  Family History  Problem Relation Age of Onset  . CAD Mother   .  CAD Sister    Family Psychiatric  History:  Social History:  Social History   Substance and Sexual Activity  Alcohol Use No   Comment: occassionally     Social History   Substance and Sexual Activity  Drug Use Not Currently  . Types: Marijuana   Comment: Last use 11/29/16    Social History   Socioeconomic History  . Marital status: Single    Spouse name: Not on  file  . Number of children: Not on file  . Years of education: Not on file  . Highest education level: Not on file  Occupational History  . Not on file  Tobacco Use  . Smoking status: Current Every Day Smoker    Packs/day: 1.00    Years: 23.00    Pack years: 23.00    Types: Cigarettes  . Smokeless tobacco: Never Used  . Tobacco comment: will provide material  Substance and Sexual Activity  . Alcohol use: No    Comment: occassionally  . Drug use: Not Currently    Types: Marijuana    Comment: Last use 11/29/16  . Sexual activity: Never    Birth control/protection: None    Comment: occasional marijuana- none recently  Other Topics Concern  . Not on file  Social History Narrative   From a group home in [place]   Social Determinants of Health   Financial Resource Strain: Not on file  Food Insecurity: Not on file  Transportation Needs: Not on file  Physical Activity: Not on file  Stress: Not on file  Social Connections: Not on file   Additional Social History:    Allergies:   Allergies  Allergen Reactions  . Penicillins Other (See Comments)    Reaction: "lockjaw" Has patient had a PCN reaction causing immediate rash, facial/tongue/throat swelling, SOB or lightheadedness with hypotension: No Has patient had a PCN reaction causing severe rash involving mucus membranes or skin necrosis: No Has patient had a PCN reaction that required hospitalization: No Has patient had a PCN reaction occurring within the last 10 years: No If all of the above answers are "NO", then may proceed with Cephalosporin use.     Labs:  Results for orders placed or performed during the hospital encounter of 09/19/20 (from the past 48 hour(s))  Comprehensive metabolic panel     Status: Abnormal   Collection Time: 09/19/20  6:56 PM  Result Value Ref Range   Sodium 138 135 - 145 mmol/L   Potassium 3.6 3.5 - 5.1 mmol/L   Chloride 107 98 - 111 mmol/L   CO2 21 (L) 22 - 32 mmol/L   Glucose, Bld  206 (H) 70 - 99 mg/dL    Comment: Glucose reference range applies only to samples taken after fasting for at least 8 hours.   BUN 21 (H) 6 - 20 mg/dL   Creatinine, Ser 1.61 0.61 - 1.24 mg/dL   Calcium 9.7 8.9 - 09.6 mg/dL   Total Protein 6.9 6.5 - 8.1 g/dL   Albumin 4.6 3.5 - 5.0 g/dL   AST 18 15 - 41 U/L   ALT 19 0 - 44 U/L   Alkaline Phosphatase 67 38 - 126 U/L   Total Bilirubin 0.4 0.3 - 1.2 mg/dL   GFR, Estimated >04 >54 mL/min    Comment: (NOTE) Calculated using the CKD-EPI Creatinine Equation (2021)    Anion gap 10 5 - 15    Comment: Performed at [facility], [address] [place], [place] [address]  Ethanol  Status: None   Collection Time: 09/19/20  6:56 PM  Result Value Ref Range   Alcohol, Ethyl (B) <10 <10 mg/dL    Comment: (NOTE) Lowest detectable limit for serum alcohol is 10 mg/dL.  For medical purposes only. Performed at [facility], [address] [place], [place] [address]   Salicylate level     Status: Abnormal   Collection Time: 09/19/20  6:56 PM  Result Value Ref Range   Salicylate Lvl <7.0 (L) 7.0 - 30.0 mg/dL    Comment: Performed at [facility], [address] [place], [place] [address]  Acetaminophen level     Status: Abnormal   Collection Time: 09/19/20  6:56 PM  Result Value Ref Range   Acetaminophen (Tylenol), Serum <10 (L) 10 - 30 ug/mL    Comment: (NOTE) Therapeutic concentrations vary significantly. A range of 10-30 ug/mL  may be an effective concentration for many patients. However, some  are best treated at concentrations outside of this range. Acetaminophen concentrations >150 ug/mL at 4 hours after ingestion  and >50 ug/mL at 12 hours after ingestion are often associated with  toxic reactions.  Performed at [facility], [address] [place], [place] [address]   cbc     Status: None   Collection Time: 09/19/20  6:56 PM  Result Value Ref Range   WBC 5.4 4.0 - 10.5 K/uL   RBC 4.67  4.22 - 5.81 MIL/uL   Hemoglobin 15.3 13.0 - 17.0 g/dL   HCT 50.5 39.7 - 67.3 %   MCV 92.1 80.0 - 100.0 fL   MCH 32.8 26.0 - 34.0 pg   MCHC 35.6 30.0 - 36.0 g/dL   RDW 41.9 37.9 - 02.4 %   Platelets 216 150 - 400 K/uL   nRBC 0.0 0.0 - 0.2 %    Comment: Performed at [facility], [address] [place], [place] [address]  Urine Drug Screen, Qualitative     Status: Abnormal   Collection Time: 09/19/20  6:56 PM  Result Value Ref Range   Tricyclic, Ur Screen POSITIVE (A) NONE DETECTED   Amphetamines, Ur Screen NONE DETECTED NONE DETECTED   MDMA (Ecstasy)Ur Screen NONE DETECTED NONE DETECTED   Cocaine Metabolite,Ur [place] NONE DETECTED NONE DETECTED   Opiate, Ur Screen NONE DETECTED NONE DETECTED   Phencyclidine (PCP) Ur S NONE DETECTED NONE DETECTED   Cannabinoid 50 Ng, Ur [place] NONE DETECTED NONE DETECTED   Barbiturates, Ur Screen NONE DETECTED NONE DETECTED   Benzodiazepine, Ur Scrn NONE DETECTED NONE DETECTED   Methadone Scn, Ur NONE DETECTED NONE DETECTED    Comment: (NOTE) Tricyclics + metabolites, urine    Cutoff 1000 ng/mL Amphetamines + metabolites, urine  Cutoff 1000 ng/mL MDMA (Ecstasy), urine              Cutoff 500 ng/mL Cocaine Metabolite, urine          Cutoff 300 ng/mL Opiate + metabolites, urine        Cutoff 300 ng/mL Phencyclidine (PCP), urine         Cutoff 25 ng/mL Cannabinoid, urine                 Cutoff 50 ng/mL Barbiturates + metabolites, urine  Cutoff 200 ng/mL Benzodiazepine, urine              Cutoff 200 ng/mL Methadone, urine                   Cutoff 300 ng/mL  The urine drug screen provides only a preliminary, unconfirmed analytical test result and should not be used for non-medical purposes. Clinical consideration and professional judgment should be applied to any positive drug screen result due to possible interfering substances. A more specific alternate chemical method must be used in order to obtain a confirmed analytical result. Gas  chromatography / mass spectrometry (GC/MS) is the preferred confirm atory method. Performed at [facility], [address] [place], [place] [address]   Lithium level     Status: None   Collection Time: 09/19/20  6:56 PM  Result Value Ref Range   Lithium Lvl 1.12 0.60 - 1.20 mmol/L    Comment: Performed at [facility], [address] [place], [place] [address]  Resp Panel by RT-PCR (Flu A&B, Covid) Nasopharyngeal Swab     Status: None   Collection Time: 09/19/20  8:06 PM   Specimen: Nasopharyngeal Swab; Nasopharyngeal(NP) swabs in vial transport medium  Result Value Ref Range   SARS Coronavirus 2 by RT PCR NEGATIVE NEGATIVE    Comment: (NOTE) SARS-CoV-2 target nucleic acids are NOT DETECTED.  The SARS-CoV-2 RNA is generally detectable in upper respiratory specimens during the acute phase of infection. The lowest concentration of SARS-CoV-2 viral copies this assay can detect is 138 copies/mL. A negative result does not preclude SARS-Cov-2 infection and should not be used as the sole basis for treatment or other patient management decisions. A negative result may occur with  improper specimen collection/handling, submission of specimen other than nasopharyngeal swab, presence of viral mutation(s) within the areas targeted by this assay, and inadequate number of viral copies(<138 copies/mL). A negative result must be combined with clinical observations, patient history, and epidemiological information. The expected result is Negative.  Fact Sheet for Patients:  BloggerCourse.com  Fact Sheet for Healthcare Providers:  SeriousBroker.it  This test is no t yet approved or cleared by the [place] FDA and  has been authorized for detection and/or diagnosis of SARS-CoV-2 by FDA under an Emergency Use Authorization (EUA). This EUA will remain  in effect (meaning this test can be used) for the duration of  the COVID-19 declaration under Section 564(b)(1) of the Act, 21 U.S.C.section 360bbb-3(b)(1), unless the authorization is terminated  or revoked sooner.       Influenza A by PCR NEGATIVE NEGATIVE   Influenza B by PCR NEGATIVE NEGATIVE    Comment: (NOTE) The Xpert Xpress SARS-CoV-2/FLU/RSV plus assay is intended as an aid in the diagnosis of influenza from Nasopharyngeal swab specimens and should not be used as a sole basis for treatment. Nasal washings and aspirates are unacceptable for Xpert Xpress SARS-CoV-2/FLU/RSV testing.  Fact Sheet for Patients: BloggerCourse.com  Fact Sheet for Healthcare Providers: SeriousBroker.it  This test is not yet approved or cleared by the [place] FDA and has been authorized for detection and/or diagnosis of SARS-CoV-2 by FDA under an Emergency Use Authorization (EUA). This EUA will remain in effect (meaning this test can be used) for the duration of the COVID-19 declaration under Section 564(b)(1) of the Act, 21 U.S.C. section 360bbb-3(b)(1), unless the authorization is terminated or revoked.  Performed at [facility], [address], [place], [place] [address]     Current Facility-Administered Medications  Medication Dose Route Frequency Provider Last Rate Last Admin  . albuterol (VENTOLIN HFA) 108 (90 Base) MCG/ACT inhaler 1-2 puff  1-2 puff Inhalation Q6H PRN Chesley Noon, MD      . apixaban (ELIQUIS) tablet 5 mg  5  mg Oral BID Tressie Ellis, RPH   5 mg at 09/19/20 2209  . cloZAPine (CLOZARIL) tablet 500 mg  500 mg Oral QHS Chesley Noon, MD      . fluticasone furoate-vilanterol (BREO ELLIPTA) 200-25 MCG/INH 1 puff  1 puff Inhalation Daily Chesley Noon, MD   1 puff at 09/19/20 2212  . lisinopril (ZESTRIL) tablet 2.5 mg  2.5 mg Oral Daily Chesley Noon, MD   2.5 mg at 09/19/20 2210  . lithium carbonate (ESKALITH) CR tablet 450 mg  450 mg Oral Q12H Chesley Noon, MD   450 mg at 09/19/20 2210  . [START ON 09/20/2020] metFORMIN (GLUCOPHAGE) tablet 1,000 mg  1,000 mg Oral BID WC Chesley Noon, MD      . OLANZapine [facility]) tablet 10 mg  10 mg Oral BID Chesley Noon, MD   10 mg at 09/19/20 2210  . sertraline (ZOLOFT) tablet 50 mg  50 mg Oral Daily Chesley Noon, MD   50 mg at 09/19/20 2209  . [START ON 09/20/2020] simvastatin (ZOCOR) tablet 40 mg  40 mg Oral q1800 Chesley Noon, MD      . traZODone (DESYREL) tablet 100 mg  100 mg Oral QHS Chesley Noon, MD   100 mg at 09/19/20 2209   Current Outpatient Medications  Medication Sig Dispense Refill  . albuterol (VENTOLIN HFA) 108 (90 Base) MCG/ACT inhaler Inhale 1-2 puffs into the lungs every 6 (six) hours as needed for wheezing or shortness of breath. 18 g 1  . apixaban (ELIQUIS) 5 MG TABS tablet Take 1 tablet (5 mg total) by mouth 2 (two) times daily. 60 tablet 6  . cloZAPine (CLOZARIL) 100 MG tablet Take 5 tablets (500 mg total) by mouth at bedtime. 150 tablet 1  . glucose blood (FORA V30A BLOOD GLUCOSE TEST) test strip Use as instructed 100 each 12  . LEVEMIR FLEXTOUCH 100 UNIT/ML FlexPen Inject 10 Units into the skin 2 (two) times daily.    Marland Kitchen lisinopril (ZESTRIL) 2.5 MG tablet Take 1 tablet (2.5 mg total) by mouth daily. 30 tablet 1  . lithium carbonate (ESKALITH) 450 MG CR tablet Take 1 tablet (450 mg total) by mouth every 12 (twelve) hours. 60 tablet 1  . metFORMIN (GLUCOPHAGE) 1000 MG tablet Take 1 tablet (1,000 mg total) by mouth 2 (two) times daily with a meal. 60 tablet 1  . NOVOFINE AUTOCOVER PEN NEEDLE 30G X 8 MM MISC     . OLANZapine (ZYPREXA) 20 MG tablet Take 20 mg by mouth at bedtime.    . Omega-3 Fatty Acids (FISH OIL) 1000 MG CAPS Take 1 capsule by mouth daily.    . sertraline (ZOLOFT) 50 MG tablet Take 1 tablet (50 mg total) by mouth daily. 30 tablet 1  . simvastatin (ZOCOR) 40 MG tablet Take 1 tablet (40 mg total) by mouth daily at 6 PM. 30 tablet 6  . traZODone (DESYREL)  100 MG tablet Take 1 tablet (100 mg total) by mouth at bedtime. 30 tablet 1    Musculoskeletal: Strength & Muscle Tone: within normal limits Gait & Station: normal Patient leans: Backward  Psychiatric Specialty Exam:  Presentation  General Appearance: Disheveled  Eye Contact:Good  Speech:Clear and Coherent; Normal Rate  Speech Volume:Normal  Handedness:Right   Mood and Affect  Mood:Anxious; Depressed; Hopeless  Affect:Appropriate; Non-Congruent   Thought Process  Thought Processes:Coherent  Descriptions of Associations:Intact  Orientation:No data recorded Thought Content:Abstract Reasoning; Logical  History of Schizophrenia/Schizoaffective disorder:Yes  Duration of Psychotic Symptoms:Greater than six months  Hallucinations:Hallucinations: None  Ideas of Reference:None  Suicidal Thoughts:Suicidal Thoughts: Yes, Active SI Active Intent and/or Plan: With Plan  Homicidal Thoughts:Homicidal Thoughts: No   Sensorium  Memory:Immediate Good; Recent Good; Remote Good  Judgment:Poor  Insight:Poor   Executive Functions  Concentration:Good  Attention Span:Good  Recall:Good  Fund of Knowledge:Good  Language:Good   Psychomotor Activity  Psychomotor Activity:Psychomotor Activity: Normal   Assets  Assets:Housing; Communication Skills   Sleep  Sleep:Sleep: Good   Physical Exam: Physical Exam Vitals and nursing note reviewed.  Constitutional:      Appearance: Normal appearance. He is normal weight.  HENT:     Nose: Nose normal.     Mouth/Throat:     Mouth: Mucous membranes are moist.  Cardiovascular:     Rate and Rhythm: Normal rate.     Pulses: Normal pulses.  Pulmonary:     Effort: Pulmonary effort is normal.  Musculoskeletal:        General: Normal range of motion.     Cervical back: Normal range of motion and neck supple.  Neurological:     General: No focal deficit present.     Mental Status: He is alert and oriented to person,  place, and time. Mental status is at baseline.  Psychiatric:        Attention and Perception: Attention and perception normal.        Mood and Affect: Mood and affect normal.        Speech: Speech normal.        Behavior: Behavior normal. Behavior is cooperative.        Thought Content: Thought content includes suicidal ideation.        Cognition and Memory: Cognition and memory normal.        Judgment: Judgment normal.    ROS Blood pressure (!) 148/79, pulse 81, temperature 98.3 F (36.8 C), temperature source Oral, resp. rate (!) 22, height 5\' 10"  (1.778 m), weight 86.2 kg, SpO2 99 %. Body mass index is 27.26 kg/m.  Treatment Plan Summary: Plan The patient meets criteria for psychiatric inpatient admission.  Disposition: Recommend psychiatric Inpatient admission when medically cleared. Supportive therapy provided about ongoing stressors.  Gillermo MurdochJacqueline Maliki Gignac, NP 09/19/2020 11:36 PM

## 2020-09-19 NOTE — ED Notes (Signed)
Hourly rounding completed at this time, patient currently awake in hallway bed. No complaints, stable, and in no acute distress. Q15 minute rounds and monitoring via Rover and Officer to continue. 

## 2020-09-19 NOTE — ED Notes (Signed)
Patient transferred from Triage to room [facility] after dressing out and screening for contraband. Report received from [place], [place] including situation, background, assessment and recommendations. Pt oriented to [employer] including Q15 minute rounds as well as Psychologist, counselling for their protection. Patient is alert and oriented, warm and dry in no acute distress. Patient denies AH. Pt. Encouraged to let this nurse know if needs arise.

## 2020-09-19 NOTE — BH Assessment (Signed)
Comprehensive Clinical Assessment (CCA) Note  09/19/2020 Lance [name] [id number]  Chief Complaint: Patient is a 45 year old male presenting to [facility] ED under IVC. Per triage note Pt was seen at [facility] today and told staff that he was suicidal. Pt states in triage that he has a plan to cut his wrists, pt also states that he is homicidal. Pt is under IVC from [facility]. Pt calm and cooperative in triage. During assessment patient appears alert and oriented x4, patient continues to report SI but currently has no plan. Patient reports that he currently lives in a [facility] and does have a current ACT team. Patient also reports VH "sometimes." Patient has had previous hospitalizations with the most recent being with [facility] BM[facility]  In 06/2020 for similar presentation and worsening depression. Patient denies any current alcohol use and reports poor sleep but adequate appetite. Patient reports SI/VH and denies HI/AH, patient does not appear to be responding to any internal or external stimuli.  Per Psyc NP Elenore Paddy patient is recommended for Inpatient Hospitalization  Chief Complaint  Patient presents with  . IVC   Visit Diagnosis: Schizoaffective disorder, bipolar type by hx   CCA Screening, Triage and Referral (STR)  Patient Reported Information How did you hear about [place]? Legal System  Referral name: No data recorded Referral phone number: No data recorded  Whom do you see for routine medical problems? Other (Comment)  Practice/Facility Name: Internal Medicine- [facility]  Practice/Facility Phone Number: No data recorded Name of Contact: Dr. Sheppard Plumber Number: No data recorded Contact Fax Number: No data recorded Prescriber Name: No data recorded Prescriber Address (if known): No data recorded  What Is the Reason for Your Visit/Call Today? No data recorded How Long Has This Been Causing You Problems? > than 6 months  What Do You Feel Would Help You the Most Today? Medication;  Therapy   Have You Recently Been in Any Inpatient Treatment (Hospital/Detox/Crisis Center/28-Day Program)? Yes  Name/Location of Program/Hospital:[facility] BMU  How Long Were You There? 06/2020  When Were You Discharged? No data recorded  Have You Ever Received Services From [facility] Before? Yes  Who Do You See at [facility]? Inpatient treatment   Have You Recently Had Any Thoughts About Hurting Yourself? Yes  Are You Planning to Commit Suicide/Harm Yourself At This time? No   Have you Recently Had Thoughts About Hurting Someone Karolee Ohs? Yes  Explanation: No data recorded  Have You Used Any Alcohol or Drugs in the Past 24 Hours? No  How Long Ago Did You Use Drugs or Alcohol? No data recorded What Did You Use and How Much? No data recorded  Do You Currently Have a Therapist/Psychiatrist? Yes  Name of Therapist/Psychiatrist: [facility]   Have You Been Recently Discharged From Any Office Practice or Programs? No  Explanation of Discharge From Practice/Program: No data recorded    CCA Screening Triage Referral Assessment Type of Contact: Face-to-Face  Is this Initial or Reassessment? No data recorded Date Telepsych consult ordered in [facility]:  No data recorded Time Telepsych consult ordered in [facility]:  No data recorded  Patient Reported Information Reviewed? Yes  Patient Left Without Being Seen? No data recorded Reason for Not Completing Assessment: No data recorded  Collateral Involvement: Melina Fiddler [phone]   Does Patient Have a Court Appointed Legal Guardian? No data recorded Name and Contact of Legal Guardian: [name] [phone]  If Minor and Not Living with Parent(s), Who has Custody? n/a  Is  CPS involved or ever been involved? Never  Is APS involved or ever been involved? Never   Patient Determined To Be At Risk for Harm To Self or Others Based on Review of Patient Reported Information or Presenting Complaint? Yes, for Self-Harm  Method: No data  recorded Availability of Means: No data recorded Intent: No data recorded Notification Required: No data recorded Additional Information for Danger to Others Potential: No data recorded Additional Comments for Danger to Others Potential: No data recorded Are There Guns or Other Weapons in Your Home? No  Types of Guns/Weapons: No data recorded Are These Weapons Safely Secured?                            No data recorded Who Could Verify You Are Able To Have These Secured: No data recorded Do You Have any Outstanding Charges, Pending Court Dates, Parole/Probation? No data recorded Contacted To Inform of Risk of Harm To Self or Others: No data recorded  Location of Assessment: [facility] ED   Does Patient Present under Involuntary Commitment? Yes  IVC Papers Initial File Date: 09/19/2020   [place] of Residence: [place]   Patient Currently Receiving the Following Services: Medication Management   Determination of Need: Emergent (2 hours)   Options For Referral: Inpatient Hospitalization     CCA Biopsychosocial Intake/Chief Complaint:  Patient presents to ED under IVC with SI and plan to cut his wrists  Current Symptoms/Problems: Patient presents to ED under IVC with SI and plan to cut his wrists   Patient Reported Schizophrenia/Schizoaffective Diagnosis in Past: Yes   Strengths: Patient is able to communicate his needs  Preferences: Unknown  Abilities: Patient is able to communicate his needs   Type of Services Patient Feels are Needed: Unknown   Initial Clinical Notes/Concerns: None   Mental Health Symptoms Depression:  Change in energy/activity; Hopelessness; Irritability; Worthlessness; Sleep (too much or little)   Duration of Depressive symptoms: Greater than two weeks   Mania:  None   Anxiety:   None   Psychosis:  Hallucinations   Duration of Psychotic symptoms: Greater than six months   Trauma:  None   Obsessions:  None   Compulsions:  None    Inattention:  None   Hyperactivity/Impulsivity:  N/A   Oppositional/Defiant Behaviors:  None   Emotional Irregularity:  None   Other Mood/Personality Symptoms:  No data recorded   Mental Status Exam Appearance and self-care  Stature:  Average   Weight:  Average weight   Clothing:  Disheveled   Grooming:  Neglected   Cosmetic use:  None   Posture/gait:  Normal   Motor activity:  Not Remarkable   Sensorium  Attention:  Normal   Concentration:  Normal   Orientation:  X5   Recall/memory:  Normal   Affect and Mood  Affect:  Appropriate   Mood:  Depressed   Relating  Eye contact:  Normal   Facial expression:  Depressed   Attitude toward examiner:  Cooperative   Thought and Language  Speech flow: Clear and Coherent   Thought content:  Appropriate to Mood and Circumstances   Preoccupation:  None   Hallucinations:  Visual   Organization:  No data recorded  [employer] of Knowledge:  Fair   Intelligence:  Average   Abstraction:  Normal   Judgement:  Fair   Dance movement psychotherapist:  Realistic   Insight:  Fair   Decision Making:  Normal  Social Functioning  Social Maturity:  Isolates   Social Judgement:  Normal   Stress  Stressors:  Housing   Coping Ability:  Normal   Skill Deficits:  None   Supports:  Support needed     Religion: Religion/Spirituality Are You A Religious Person?: No  Leisure/Recreation: Leisure / Recreation Do You Have Hobbies?: No  Exercise/Diet: Exercise/Diet Do You Exercise?: No Have You Gained or Lost A Significant Amount of Weight in the Past Six Months?: No Do You Follow a Special Diet?: No Do You Have Any Trouble Sleeping?: Yes Explanation of Sleeping Difficulties: Patient reports difficulty sleeping   CCA Employment/Education Employment/Work Situation: Employment / Work Situation Employment situation: On disability Why is patient on disability: Mental Health How long has patient been  on disability: Unknown What is the longest time patient has a held a job?: Unknown Has patient ever been in the [employer]?: No  Education: Education Is Patient Currently Attending School?: No   CCA Family/Childhood History Family and Relationship History: Family history Marital status: Single Are you sexually active?:  (Unknown) What is your sexual orientation?: Unknown Does patient have children?: No  Childhood History:  Childhood History Additional childhood history information: None reported Description of patient's relationship with caregiver when they were a child: None reported Patient's description of current relationship with people who raised him/her: None reported How were you disciplined when you got in trouble as a child/adolescent?: None reported Does patient have siblings?: No Did patient suffer any verbal/emotional/physical/sexual abuse as a child?:  (Unknown) Did patient suffer from severe childhood neglect?:  (Unknown) Has patient ever been sexually abused/assaulted/raped as an adolescent or adult?:  (Unknown) Was the patient ever a victim of a crime or a disaster?:  (Unknown) Witnessed domestic violence?:  (Unknown) Has patient been affected by domestic violence as an adult?:  (Unknown)  Child/Adolescent Assessment:     CCA Substance Use Alcohol/Drug Use: Alcohol / Drug Use Pain Medications: See MAR Prescriptions: See MAR Over the Counter: See MAR History of alcohol / drug use?: No history of alcohol / drug abuse                         ASAM's:  Six Dimensions of Multidimensional Assessment  Dimension 1:  Acute Intoxication and/or Withdrawal Potential:      Dimension 2:  Biomedical Conditions and Complications:      Dimension 3:  Emotional, Behavioral, or Cognitive Conditions and Complications:     Dimension 4:  Readiness to Change:     Dimension 5:  Relapse, Continued use, or Continued Problem Potential:     Dimension 6:   Recovery/Living Environment:     ASAM Severity Score:    ASAM Recommended Level of Treatment:     Substance use Disorder (SUD)    Recommendations for Services/Supports/Treatments:   Per Psyc NP Elenore Paddy patient is recommended for Inpatient Hospitalization   DSM5 Diagnoses: Patient Active Problem List   Diagnosis Date Noted  . Alcohol abuse 04/12/2018  . Tobacco use disorder 09/01/2016  . Schizoaffective disorder, bipolar type (HCC) 05/22/2016  . Diabetes mellitus without complication (HCC) 05/21/2016  . Hyperlipidemia 05/21/2016  . History of pulmonary embolism 05/21/2016  . Chronic anticoagulation 05/21/2016  . Hypertension 09/25/2015    Patient Centered Plan: Patient is on the following Treatment Plan(s):  Depression   Referrals to Alternative Service(s): Referred to Alternative Service(s):   Place:   Date:   Time:    Referred to Alternative Service(s):  Place:   Date:   Time:    Referred to Alternative Service(s):   Place:   Date:   Time:    Referred to Alternative Service(s):   Place:   Date:   Time:     Zellie Jenning A Lilith Solana, [name]-A

## 2020-09-19 NOTE — ED Notes (Signed)
Pt belongings: Red shirt Blue jeans Black jacket Black shoes Black socks Black underwear [place] belt [employer]

## 2020-09-20 ENCOUNTER — Encounter: Payer: Self-pay | Admitting: Psychiatry

## 2020-09-20 ENCOUNTER — Inpatient Hospital Stay
Admission: RE | Admit: 2020-09-20 | Discharge: 2020-09-26 | DRG: 885 | Disposition: A | Discharge status: Home / Self Care | Payer: Medicare Other | Source: Intra-hospital | Attending: Behavioral Health | Admitting: Behavioral Health

## 2020-09-20 DIAGNOSIS — E7800 Pure hypercholesterolemia, unspecified: Secondary | ICD-10-CM | POA: Diagnosis present

## 2020-09-20 DIAGNOSIS — F251 Schizoaffective disorder, depressive type: Secondary | ICD-10-CM | POA: Diagnosis not present

## 2020-09-20 DIAGNOSIS — F17210 Nicotine dependence, cigarettes, uncomplicated: Secondary | ICD-10-CM | POA: Diagnosis present

## 2020-09-20 DIAGNOSIS — G4700 Insomnia, unspecified: Secondary | ICD-10-CM | POA: Diagnosis present

## 2020-09-20 DIAGNOSIS — Z9152 Personal history of nonsuicidal self-harm: Secondary | ICD-10-CM | POA: Diagnosis not present

## 2020-09-20 DIAGNOSIS — F250 Schizoaffective disorder, bipolar type: Secondary | ICD-10-CM

## 2020-09-20 DIAGNOSIS — Z880 Allergy status to penicillin: Secondary | ICD-10-CM

## 2020-09-20 DIAGNOSIS — E119 Type 2 diabetes mellitus without complications: Secondary | ICD-10-CM | POA: Diagnosis present

## 2020-09-20 DIAGNOSIS — Z20822 Contact with and (suspected) exposure to covid-19: Secondary | ICD-10-CM | POA: Diagnosis present

## 2020-09-20 DIAGNOSIS — Z86711 Personal history of pulmonary embolism: Secondary | ICD-10-CM | POA: Diagnosis not present

## 2020-09-20 DIAGNOSIS — E785 Hyperlipidemia, unspecified: Secondary | ICD-10-CM | POA: Diagnosis present

## 2020-09-20 DIAGNOSIS — Z7901 Long term (current) use of anticoagulants: Secondary | ICD-10-CM

## 2020-09-20 DIAGNOSIS — Z794 Long term (current) use of insulin: Secondary | ICD-10-CM

## 2020-09-20 DIAGNOSIS — R45851 Suicidal ideations: Secondary | ICD-10-CM | POA: Diagnosis present

## 2020-09-20 DIAGNOSIS — Z7984 Long term (current) use of oral hypoglycemic drugs: Secondary | ICD-10-CM | POA: Diagnosis not present

## 2020-09-20 DIAGNOSIS — I10 Essential (primary) hypertension: Secondary | ICD-10-CM | POA: Diagnosis present

## 2020-09-20 DIAGNOSIS — F17200 Nicotine dependence, unspecified, uncomplicated: Secondary | ICD-10-CM | POA: Diagnosis present

## 2020-09-20 LAB — CBG MONITORING, ED
Glucose-Capillary: 60 mg/dL — ABNORMAL LOW (ref 70–99)
Glucose-Capillary: 115 mg/dL — ABNORMAL HIGH (ref 70–99)

## 2020-09-20 LAB — LDL CHOLESTEROL, DIRECT: Direct LDL: 68.6 mg/dL (ref 0–99)

## 2020-09-20 LAB — CBC WITH DIFFERENTIAL/PLATELET
Abs Immature Granulocytes: 0.02 10*3/uL (ref 0.00–0.07)
Basophils Absolute: 0 10*3/uL (ref 0.0–0.1)
Basophils Relative: 0 %
Eosinophils Absolute: 0 10*3/uL (ref 0.0–0.5)
Eosinophils Relative: 0 %
HCT: 41.5 % (ref 39.0–52.0)
Hemoglobin: 14.6 g/dL (ref 13.0–17.0)
Immature Granulocytes: 0 %
Lymphocytes Relative: 28 %
Lymphs Abs: 1.6 10*3/uL (ref 0.7–4.0)
MCH: 32.3 pg (ref 26.0–34.0)
MCHC: 35.2 g/dL (ref 30.0–36.0)
MCV: 91.8 fL (ref 80.0–100.0)
Monocytes Absolute: 0.8 10*3/uL (ref 0.1–1.0)
Monocytes Relative: 15 %
Neutro Abs: 3.2 10*3/uL (ref 1.7–7.7)
Neutrophils Relative %: 57 %
Platelets: 215 10*3/uL (ref 150–400)
RBC: 4.52 MIL/uL (ref 4.22–5.81)
RDW: 12.8 % (ref 11.5–15.5)
WBC: 5.8 10*3/uL (ref 4.0–10.5)
nRBC: 0 % (ref 0.0–0.2)

## 2020-09-20 LAB — LIPID PANEL
Cholesterol: 157 mg/dL (ref 0–200)
HDL: 32 mg/dL — ABNORMAL LOW (ref >40)
LDL Cholesterol: UNDETERMINED mg/dL (ref 0–99)
Total CHOL/HDL Ratio: 4.9 RATIO
Triglycerides: 447 mg/dL — ABNORMAL HIGH (ref <150)
VLDL: UNDETERMINED mg/dL (ref 0–40)

## 2020-09-20 LAB — HEMOGLOBIN A1C
Hgb A1c MFr Bld: 5.1 % (ref 4.8–5.6)
Mean Plasma Glucose: 99.67 mg/dL

## 2020-09-20 LAB — GLUCOSE, CAPILLARY: Glucose-Capillary: 166 mg/dL — ABNORMAL HIGH (ref 70–99)

## 2020-09-20 MED ORDER — LITHIUM CARBONATE ER 450 MG PO TBCR
450.0000 mg | EXTENDED_RELEASE_TABLET | Freq: Two times a day (BID) | ORAL | Status: DC
  Administered 2020-09-20 – 2020-09-26 (×12): 450 mg via ORAL
  Filled 2020-09-20 (×12): qty 1

## 2020-09-20 MED ORDER — INSULIN DETEMIR 100 UNIT/ML ~~LOC~~ SOLN
10.0000 [IU] | Freq: Two times a day (BID) | SUBCUTANEOUS | Status: DC
  Filled 2020-09-20 (×2): qty 0.1

## 2020-09-20 MED ORDER — ACETAMINOPHEN 325 MG PO TABS: 650.0000 mg | ORAL_TABLET | Freq: Four times a day (QID) | ORAL | Status: DC | PRN

## 2020-09-20 MED ORDER — NICOTINE POLACRILEX 2 MG MT GUM
2.0000 mg | CHEWING_GUM | OROMUCOSAL | Status: DC | PRN
  Administered 2020-09-21 – 2020-09-25 (×7): 2 mg via ORAL
  Filled 2020-09-20 (×8): qty 1

## 2020-09-20 MED ORDER — SERTRALINE HCL 25 MG PO TABS: 50.0000 mg | ORAL_TABLET | Freq: Every day | ORAL | Status: DC

## 2020-09-20 MED ORDER — INSULIN ASPART 100 UNIT/ML ~~LOC~~ SOLN
0.0000 [IU] | Freq: Three times a day (TID) | SUBCUTANEOUS | Status: DC
  Administered 2020-09-20: 2 [IU] via SUBCUTANEOUS
  Administered 2020-09-21: 1 [IU] via SUBCUTANEOUS
  Administered 2020-09-22: 2 [IU] via SUBCUTANEOUS
  Administered 2020-09-22: 1 [IU] via SUBCUTANEOUS
  Filled 2020-09-20 (×3): qty 1

## 2020-09-20 MED ORDER — ALBUTEROL SULFATE HFA 108 (90 BASE) MCG/ACT IN AERS
1.0000 | INHALATION_SPRAY | Freq: Four times a day (QID) | RESPIRATORY_TRACT | Status: DC | PRN
Start: 2020-09-20 — End: 2020-09-26
  Filled 2020-09-20: qty 6.7

## 2020-09-20 MED ORDER — SERTRALINE HCL 100 MG PO TABS
100.0000 mg | ORAL_TABLET | Freq: Every day | ORAL | Status: DC
  Administered 2020-09-21 – 2020-09-26 (×6): 100 mg via ORAL
  Filled 2020-09-20 (×6): qty 1

## 2020-09-20 MED ORDER — APIXABAN 5 MG PO TABS
5.0000 mg | ORAL_TABLET | Freq: Two times a day (BID) | ORAL | Status: DC
  Administered 2020-09-20 – 2020-09-26 (×12): 5 mg via ORAL
  Filled 2020-09-20 (×14): qty 1

## 2020-09-20 MED ORDER — MAGNESIUM HYDROXIDE 400 MG/5ML PO SUSP: 30.0000 mL | Freq: Every day | ORAL | Status: DC | PRN

## 2020-09-20 MED ORDER — CLOZAPINE 100 MG PO TABS
500.0000 mg | ORAL_TABLET | Freq: Every day | ORAL | Status: DC
  Administered 2020-09-20 – 2020-09-25 (×6): 500 mg via ORAL
  Filled 2020-09-20 (×6): qty 5

## 2020-09-20 MED ORDER — FLUTICASONE FUROATE-VILANTEROL 200-25 MCG/INH IN AEPB
1.0000 | INHALATION_SPRAY | Freq: Every day | RESPIRATORY_TRACT | Status: DC
  Administered 2020-09-21 – 2020-09-26 (×6): 1 via RESPIRATORY_TRACT
  Filled 2020-09-20: qty 28

## 2020-09-20 MED ORDER — HYDROXYZINE HCL 50 MG PO TABS
50.0000 mg | ORAL_TABLET | Freq: Three times a day (TID) | ORAL | Status: DC | PRN
  Administered 2020-09-21 – 2020-09-23 (×3): 50 mg via ORAL
  Filled 2020-09-20 (×3): qty 1

## 2020-09-20 MED ORDER — SIMVASTATIN 40 MG PO TABS
40.0000 mg | ORAL_TABLET | Freq: Every day | ORAL | Status: DC
  Administered 2020-09-20 – 2020-09-25 (×6): 40 mg via ORAL
  Filled 2020-09-20 (×7): qty 1

## 2020-09-20 MED ORDER — INSULIN DETEMIR 100 UNIT/ML ~~LOC~~ SOLN
10.0000 [IU] | Freq: Two times a day (BID) | SUBCUTANEOUS | Status: DC
  Administered 2020-09-20 – 2020-09-21 (×2): 10 [IU] via SUBCUTANEOUS
  Filled 2020-09-20 (×3): qty 0.1

## 2020-09-20 MED ORDER — ALUM & MAG HYDROXIDE-SIMETH 200-200-20 MG/5ML PO SUSP: 30.0000 mL | ORAL | Status: DC | PRN

## 2020-09-20 MED ORDER — OLANZAPINE 10 MG PO TABS
10.0000 mg | ORAL_TABLET | Freq: Two times a day (BID) | ORAL | Status: DC
  Administered 2020-09-20 – 2020-09-26 (×12): 10 mg via ORAL
  Filled 2020-09-20 (×12): qty 1

## 2020-09-20 MED ORDER — LISINOPRIL 2.5 MG PO TABS
2.5000 mg | ORAL_TABLET | Freq: Every day | ORAL | Status: DC
  Administered 2020-09-21 – 2020-09-26 (×5): 2.5 mg via ORAL
  Filled 2020-09-20 (×6): qty 1

## 2020-09-20 MED ORDER — METFORMIN HCL 500 MG PO TABS
1000.0000 mg | ORAL_TABLET | Freq: Two times a day (BID) | ORAL | Status: DC
Start: 2020-09-20 — End: 2020-09-26
  Administered 2020-09-20 – 2020-09-26 (×12): 1000 mg via ORAL
  Filled 2020-09-20 (×13): qty 2

## 2020-09-20 MED ORDER — INSULIN ASPART 100 UNIT/ML ~~LOC~~ SOLN: 0.0000 [IU] | Freq: Three times a day (TID) | SUBCUTANEOUS | Status: DC

## 2020-09-20 MED ORDER — TRAZODONE HCL 100 MG PO TABS
100.0000 mg | ORAL_TABLET | Freq: Every day | ORAL | Status: DC
  Administered 2020-09-20 – 2020-09-25 (×6): 100 mg via ORAL
  Filled 2020-09-20 (×6): qty 1

## 2020-09-20 MED ORDER — PROPRANOLOL HCL 20 MG PO TABS
10.0000 mg | ORAL_TABLET | Freq: Every day | ORAL | Status: DC
  Administered 2020-09-20: 10 mg via ORAL
  Filled 2020-09-20: qty 1

## 2020-09-20 NOTE — ED Notes (Signed)
Pt requested lotion. It was provided

## 2020-09-20 NOTE — ED Notes (Signed)
Unable to obtain vitals due to patient sleeping. Will continue to monitor.   

## 2020-09-20 NOTE — ED Notes (Signed)
Meal tray given 

## 2020-09-20 NOTE — ED Notes (Signed)
Pt discharged to BMU under IVC. All belongings will be sent with patient.  Patient is calm and cooperative.

## 2020-09-20 NOTE — BHH Suicide Risk Assessment (Signed)
[facility] Admission Suicide Risk Assessment   Nursing information obtained from:    Demographic factors:    Current Mental Status:    Loss Factors:    Historical Factors:    Risk Reduction Factors:     Total Time spent with patient: 1 hour Principal Problem: Schizoaffective disorder, depressive type (HCC) Diagnosis:  Principal Problem:   Schizoaffective disorder, depressive type (HCC) Active Problems:   Hypertension   Diabetes mellitus without complication (HCC)   Hyperlipidemia   Chronic anticoagulation   Tobacco use disorder  Subjective Data: 45 year old male presenting with worsening anxiety, depression, and suicidal ideations with plan to cut himself. He notes that for last 5-6 days he has had difficulty sleeping due to high anxiety and panic attacks while he is trying to go to sleep. He notes that he feel nervous, and needs to pace around. He notes that his main stressor is living in a group home where he feels everyone hates him, and is hostile towards him. He denies anyone has been physically violent towards him, and cannot provide a concrete example of what people are saying to him that he feels is hostile. He appears disheveled and anxious on exam, and he paces for most of the interview. He is open to making medication changes to assist with depression and anxiety. He states he has been medication compliant at the group home, and his lithium level is therapeutic at 1.12. He denies visual hallucinations, auditory hallucinations, and homicidal ideations. He reports he continues to feel suicidal at time of interview.   Continued Clinical Symptoms:    The "Alcohol Use Disorders Identification Test", Guidelines for Use in Primary Care, Second Edition.  World Science writer [facility]). Score between 0-7:  no or low risk or alcohol related problems. Score between 8-15:  moderate risk of alcohol related problems. Score between 16-19:  high risk of alcohol related problems. Score 20 or above:   warrants further diagnostic evaluation for alcohol dependence and treatment.   CLINICAL FACTORS:   Severe Anxiety and/or Agitation Bipolar Disorder:   Depressive phase Unstable or Poor Therapeutic Relationship Previous Psychiatric Diagnoses and Treatments   Musculoskeletal: Strength & Muscle Tone: within normal limits Gait & Station: normal Patient leans: N/A  Psychiatric Specialty Exam:  Presentation  General Appearance: Disheveled  Eye Contact:Minimal  Speech:Clear and Coherent  Speech Volume:Normal  Handedness:Right   Mood and Affect  Mood:Anxious; Depressed  Affect:Congruent; Constricted   Thought Process  Thought Processes:Coherent  Descriptions of Associations:Intact  Orientation:Full (Time, Place and Person)  Thought Content:Logical  History of Schizophrenia/Schizoaffective disorder:Yes  Duration of Psychotic Symptoms:Greater than six months  Hallucinations:Hallucinations: None  Ideas of Reference:Paranoia; Percusatory  Suicidal Thoughts:Suicidal Thoughts: Yes, Active SI Active Intent and/or Plan: With Plan  Homicidal Thoughts:Homicidal Thoughts: No   Sensorium  Memory:Immediate Fair; Recent Fair; Remote Fair  Judgment:Poor  Insight:Shallow   Executive Functions  Concentration:Fair  Attention Span:Fair  Recall:Fair  Fund of Knowledge:Fair  Language:Fair   Psychomotor Activity  Psychomotor Activity:Psychomotor Activity: Restlessness   Assets  Assets:Communication Skills; Desire for Improvement; Financial Resources/Insurance; Housing; Resilience   Sleep  Sleep:Sleep: Good    Physical Exam: Physical Exam ROS There were no vitals taken for this visit. There is no height or weight on file to calculate BMI.   COGNITIVE FEATURES THAT CONTRIBUTE TO RISK:  Closed-mindedness and Loss of executive function    SUICIDE RISK:   Mild:  Suicidal ideation of limited frequency, intensity, duration, and specificity.  There are  no identifiable plans, no  associated intent, mild dysphoria and related symptoms, good self-control (both objective and subjective assessment), few other risk factors, and identifiable protective factors, including available and accessible social support.  PLAN OF CARE: Continue inpatient admission, see H&P for details.   I certify that inpatient services furnished can reasonably be expected to improve the patient's condition.   [name], MD 09/20/2020, 4:14 PM

## 2020-09-20 NOTE — Tx Team (Signed)
Initial Treatment Plan 09/20/2020 3:31 PM [name] [id number]   PATIENT STRESSORS: Conflict with group home residents   PATIENT STRENGTHS: Support from family and friends   PATIENT IDENTIFIED PROBLEMS: Depression  Suicidal ideation  Anxiety                 DISCHARGE CRITERIA:  Improved stabilization in mood, thinking, and/or behavior  PRELIMINARY DISCHARGE PLAN: Return to previous living arrangement  PATIENT/FAMILY INVOLVEMENT: This treatment plan has been presented to and reviewed with the patient, Lance [name].The patient has been given the opportunity to ask questions and make suggestions.  Celene Kras, RN 09/20/2020, 3:31 PM

## 2020-09-20 NOTE — H&P (Signed)
Psychiatric Admission Assessment Adult  Patient Identification: Lance [name] MRN:  [id number] Date of Evaluation:  09/20/2020 Chief Complaint:  Schizoaffective disorder, depressive type (HCC) [F25.1] Principal Diagnosis: Schizoaffective disorder, depressive type (HCC) Diagnosis:  Principal Problem:   Schizoaffective disorder, depressive type (HCC) Active Problems:   Hypertension   Diabetes mellitus without complication (HCC)   Hyperlipidemia   Chronic anticoagulation   Tobacco use disorder  CC "I got real problems."  History of Present Illness: 45 year old male presenting with worsening anxiety, depression, and suicidal ideations with plan to cut himself. He notes that for last 5-6 days he has had difficulty sleeping due to high anxiety and panic attacks while he is trying to go to sleep. He notes that he feel nervous, and needs to pace around. He notes that his main stressor is living in a group home where he feels everyone hates him, and is hostile towards him. He denies anyone has been physically violent towards him, and cannot provide a concrete example of what people are saying to him that he feels is hostile. He appears disheveled and anxious on exam, and he paces for most of the interview. He is open to making medication changes to assist with depression and anxiety. He states he has been medication compliant at the group home, and his lithium level is therapeutic at 1.12. He denies visual hallucinations, auditory hallucinations, and homicidal ideations. He reports he continues to feel suicidal at time of interview.   Associated Signs/Symptoms: Depression Symptoms:  depressed mood, anhedonia, insomnia, difficulty concentrating, hopelessness, suicidal thoughts with specific plan, anxiety, panic attacks, loss of energy/fatigue, Duration of Depression Symptoms: Greater than two weeks  (Hypo) Manic Symptoms:  Impulsivity, Irritable Mood, Anxiety Symptoms:  Excessive  Worry, Panic Symptoms, Psychotic Symptoms:  Denies\ PTSD Symptoms: Negative Total Time spent with patient: 1 hour  Past Psychiatric History:  Long history of mental illness diagnosis of schizoaffective disorder.  Frequently presents with similar complaints of psychotic symptoms, paranoia, as well as suicidal ideation.  Past history of cutting and self injury.  Minimal past history of aggression. Used to drink or use marijuana regularly but has not been doing that lately.  Is the patient at risk to self? Yes.    Has the patient been a risk to self in the past 6 months? Yes.    Has the patient been a risk to self within the distant past? Yes.    Is the patient a risk to others? No.  Has the patient been a risk to others in the past 6 months? No.  Has the patient been a risk to others within the distant past? No.   Prior Inpatient Therapy:   Prior Outpatient Therapy:    Alcohol Screening:   Substance Abuse History in the last 12 months:  No. Consequences of Substance Abuse: Negative Previous Psychotropic Medications: Yes  Psychological Evaluations: Yes  Past Medical History:  Past Medical History:  Diagnosis Date  . Depression   . Diabetes mellitus without complication (HCC)   . Hyperlipidemia   . Hypertension   . Lupus anticoagulant disorder (HCC) 06/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  . PE (pulmonary thromboembolism) (HCC)   . Schizo affective schizophrenia (HCC)   . Supratherapeutic INR 11/19/2016   No past surgical history on file. Family History:  Family History  Problem Relation Age of Onset  . CAD Mother   . CAD Sister    Family Psychiatric  History: None reported Tobacco Screening:   Social  History:  Social History   Substance and Sexual Activity  Alcohol Use No   Comment: occassionally     Social History   Substance and Sexual Activity  Drug Use Not Currently  . Types: Marijuana   Comment: Last use 11/29/16    Additional Social History:                            Allergies:   Allergies  Allergen Reactions  . Penicillins Other (See Comments)    Reaction: "lockjaw" Has patient had a PCN reaction causing immediate rash, facial/tongue/throat swelling, SOB or lightheadedness with hypotension: No Has patient had a PCN reaction causing severe rash involving mucus membranes or skin necrosis: No Has patient had a PCN reaction that required hospitalization: No Has patient had a PCN reaction occurring within the last 10 years: No If all of the above answers are "NO", then may proceed with Cephalosporin use.    Lab Results:  Results for orders placed or performed during the hospital encounter of 09/19/20 (from the past 48 hour(s))  Comprehensive metabolic panel     Status: Abnormal   Collection Time: 09/19/20  6:56 PM  Result Value Ref Range   Sodium 138 135 - 145 mmol/L   Potassium 3.6 3.5 - 5.1 mmol/L   Chloride 107 98 - 111 mmol/L   CO2 21 (L) 22 - 32 mmol/L   Glucose, Bld 206 (H) 70 - 99 mg/dL    Comment: Glucose reference range applies only to samples taken after fasting for at least 8 hours.   BUN 21 (H) 6 - 20 mg/dL   Creatinine, Ser 1.610.94 0.61 - 1.24 mg/dL   Calcium 9.7 8.9 - 09.610.3 mg/dL   Total Protein 6.9 6.5 - 8.1 g/dL   Albumin 4.6 3.5 - 5.0 g/dL   AST 18 15 - 41 U/L   ALT 19 0 - 44 U/L   Alkaline Phosphatase 67 38 - 126 U/L   Total Bilirubin 0.4 0.3 - 1.2 mg/dL   GFR, Estimated >04>60 >54>60 mL/min    Comment: (NOTE) Calculated using the CKD-EPI Creatinine Equation (2021)    Anion gap 10 5 - 15    Comment: Performed at [facility], [address] [place], [place] [address]  Ethanol     Status: None   Collection Time: 09/19/20  6:56 PM  Result Value Ref Range   Alcohol, Ethyl (B) <10 <10 mg/dL    Comment: (NOTE) Lowest detectable limit for serum alcohol is 10 mg/dL.  For medical purposes only. Performed at [facility], [address] [place], [place] [address]   Salicylate  level     Status: Abnormal   Collection Time: 09/19/20  6:56 PM  Result Value Ref Range   Salicylate Lvl <7.0 (L) 7.0 - 30.0 mg/dL    Comment: Performed at [facility], [address] [place], [place] [address]  Acetaminophen level     Status: Abnormal   Collection Time: 09/19/20  6:56 PM  Result Value Ref Range   Acetaminophen (Tylenol), Serum <10 (L) 10 - 30 ug/mL    Comment: (NOTE) Therapeutic concentrations vary significantly. A range of 10-30 ug/mL  may be an effective concentration for many patients. However, some  are best treated at concentrations outside of this range. Acetaminophen concentrations >150 ug/mL at 4 hours after ingestion  and >50 ug/mL at 12 hours after ingestion are often associated with  toxic reactions.  Performed  at [facility], [address] [place], [place] [address]   cbc     Status: None   Collection Time: 09/19/20  6:56 PM  Result Value Ref Range   WBC 5.4 4.0 - 10.5 K/uL   RBC 4.67 4.22 - 5.81 MIL/uL   Hemoglobin 15.3 13.0 - 17.0 g/dL   HCT 60.4 54.0 - 98.1 %   MCV 92.1 80.0 - 100.0 fL   MCH 32.8 26.0 - 34.0 pg   MCHC 35.6 30.0 - 36.0 g/dL   RDW 19.1 47.8 - 29.5 %   Platelets 216 150 - 400 K/uL   nRBC 0.0 0.0 - 0.2 %    Comment: Performed at [facility], [address], [place], [place] [address]  Urine Drug Screen, Qualitative     Status: Abnormal   Collection Time: 09/19/20  6:56 PM  Result Value Ref Range   Tricyclic, Ur Screen POSITIVE (A) NONE DETECTED   Amphetamines, Ur Screen NONE DETECTED NONE DETECTED   MDMA (Ecstasy)Ur Screen NONE DETECTED NONE DETECTED   Cocaine Metabolite,Ur [place] NONE DETECTED NONE DETECTED   Opiate, Ur Screen NONE DETECTED NONE DETECTED   Phencyclidine (PCP) Ur S NONE DETECTED NONE DETECTED   Cannabinoid 50 Ng, Ur [place] NONE DETECTED NONE DETECTED   Barbiturates, Ur Screen NONE DETECTED NONE DETECTED   Benzodiazepine, Ur Scrn NONE DETECTED NONE DETECTED   Methadone Scn, Ur NONE  DETECTED NONE DETECTED    Comment: (NOTE) Tricyclics + metabolites, urine    Cutoff 1000 ng/mL Amphetamines + metabolites, urine  Cutoff 1000 ng/mL MDMA (Ecstasy), urine              Cutoff 500 ng/mL Cocaine Metabolite, urine          Cutoff 300 ng/mL Opiate + metabolites, urine        Cutoff 300 ng/mL Phencyclidine (PCP), urine         Cutoff 25 ng/mL Cannabinoid, urine                 Cutoff 50 ng/mL Barbiturates + metabolites, urine  Cutoff 200 ng/mL Benzodiazepine, urine              Cutoff 200 ng/mL Methadone, urine                   Cutoff 300 ng/mL  The urine drug screen provides only a preliminary, unconfirmed analytical test result and should not be used for non-medical purposes. Clinical consideration and professional judgment should be applied to any positive drug screen result due to possible interfering substances. A more specific alternate chemical method must be used in order to obtain a confirmed analytical result. Gas chromatography / mass spectrometry (GC/MS) is the preferred confirm atory method. Performed at [facility], [address] [place], [place] [address]   Lithium level     Status: None   Collection Time: 09/19/20  6:56 PM  Result Value Ref Range   Lithium Lvl 1.12 0.60 - 1.20 mmol/L    Comment: Performed at [facility], [address] [place], [place] [address]  Resp Panel by RT-PCR (Flu A&B, Covid) Nasopharyngeal Swab     Status: None   Collection Time: 09/19/20  8:06 PM   Specimen: Nasopharyngeal Swab; Nasopharyngeal(NP) swabs in vial transport medium  Result Value Ref Range   SARS Coronavirus 2 by RT PCR NEGATIVE NEGATIVE    Comment: (NOTE) SARS-CoV-2 target nucleic acids are NOT DETECTED.  The SARS-CoV-2 RNA is generally detectable  in upper respiratory specimens during the acute phase of infection. The lowest concentration of SARS-CoV-2 viral copies this assay can detect is 138 copies/mL. A negative result does not  preclude SARS-Cov-2 infection and should not be used as the sole basis for treatment or other patient management decisions. A negative result may occur with  improper specimen collection/handling, submission of specimen other than nasopharyngeal swab, presence of viral mutation(s) within the areas targeted by this assay, and inadequate number of viral copies(<138 copies/mL). A negative result must be combined with clinical observations, patient history, and epidemiological information. The expected result is Negative.  Fact Sheet for Patients:  BloggerCourse.com  Fact Sheet for Healthcare Providers:  SeriousBroker.it  This test is no t yet approved or cleared by the [place] FDA and  has been authorized for detection and/or diagnosis of SARS-CoV-2 by FDA under an Emergency Use Authorization (EUA). This EUA will remain  in effect (meaning this test can be used) for the duration of the COVID-19 declaration under Section 564(b)(1) of the Act, 21 U.S.C.section 360bbb-3(b)(1), unless the authorization is terminated  or revoked sooner.       Influenza A by PCR NEGATIVE NEGATIVE   Influenza B by PCR NEGATIVE NEGATIVE    Comment: (NOTE) The Xpert Xpress SARS-CoV-2/FLU/RSV plus assay is intended as an aid in the diagnosis of influenza from Nasopharyngeal swab specimens and should not be used as a sole basis for treatment. Nasal washings and aspirates are unacceptable for Xpert Xpress SARS-CoV-2/FLU/RSV testing.  Fact Sheet for Patients: BloggerCourse.com  Fact Sheet for Healthcare Providers: SeriousBroker.it  This test is not yet approved or cleared by the [place] FDA and has been authorized for detection and/or diagnosis of SARS-CoV-2 by FDA under an Emergency Use Authorization (EUA). This EUA will remain in effect (meaning this test can be used) for the duration of  the COVID-19 declaration under Section 564(b)(1) of the Act, 21 U.S.C. section 360bbb-3(b)(1), unless the authorization is terminated or revoked.  Performed at [facility], [address] [place], [place] [address]   CBC with Differential/Platelet     Status: None   Collection Time: 09/19/20 11:51 PM  Result Value Ref Range   WBC 5.8 4.0 - 10.5 K/uL   RBC 4.52 4.22 - 5.81 MIL/uL   Hemoglobin 14.6 13.0 - 17.0 g/dL   HCT 14.7 82.9 - 56.2 %   MCV 91.8 80.0 - 100.0 fL   MCH 32.3 26.0 - 34.0 pg   MCHC 35.2 30.0 - 36.0 g/dL   RDW 13.0 86.5 - 78.4 %   Platelets 215 150 - 400 K/uL   nRBC 0.0 0.0 - 0.2 %   Neutrophils Relative % 57 %   Neutro Abs 3.2 1.7 - 7.7 K/uL   Lymphocytes Relative 28 %   Lymphs Abs 1.6 0.7 - 4.0 K/uL   Monocytes Relative 15 %   Monocytes Absolute 0.8 0.1 - 1.0 K/uL   Eosinophils Relative 0 %   Eosinophils Absolute 0.0 0.0 - 0.5 K/uL   Basophils Relative 0 %   Basophils Absolute 0.0 0.0 - 0.1 K/uL   Immature Granulocytes 0 %   Abs Immature Granulocytes 0.02 0.00 - 0.07 K/uL    Comment: Performed at [facility], [address] [place], [place] [address]  CBG monitoring, ED     Status: Abnormal   Collection Time: 09/20/20 12:55 PM  Result Value Ref Range   Glucose-Capillary 60 (L) 70 - 99 mg/dL    Comment: Glucose reference  range applies only to samples taken after fasting for at least 8 hours.  CBG monitoring, ED     Status: Abnormal   Collection Time: 09/20/20  2:49 PM  Result Value Ref Range   Glucose-Capillary 115 (H) 70 - 99 mg/dL    Comment: Glucose reference range applies only to samples taken after fasting for at least 8 hours.    Blood Alcohol level:  Lab Results  Component Value Date   ETH <10 09/19/2020   ETH <10 07/14/2020    Metabolic Disorder Labs:  Lab Results  Component Value Date   HGBA1C 5.3 07/16/2020   MPG 105.41 07/16/2020   MPG 105.41 07/14/2020   Lab Results  Component Value Date   PROLACTIN 2.0  (L) 08/31/2016   PROLACTIN 4.8 05/22/2016   Lab Results  Component Value Date   CHOL 135 07/16/2020   TRIG 226 (H) 07/16/2020   HDL 37 (L) 07/16/2020   CHOLHDL 3.6 07/16/2020   VLDL 45 (H) 07/16/2020   LDLCALC 53 07/16/2020   LDLCALC UNABLE TO CALCULATE IF TRIGLYCERIDE OVER 400 mg/dL 16/03/9603    Current Medications: Current Facility-Administered Medications  Medication Dose Route Frequency Provider Last Rate Last Admin  . acetaminophen (TYLENOL) tablet 650 mg  650 mg Oral Q6H PRN Clapacs, [name], MD      . albuterol (VENTOLIN HFA) 108 (90 Base) MCG/ACT inhaler 1-2 puff  1-2 puff Inhalation Q6H PRN Clapacs, [name], MD      . alum & mag hydroxide-simeth (MAALOX/MYLANTA) 200-200-20 MG/5ML suspension 30 mL  30 mL Oral Q4H PRN Clapacs, [name], MD      . apixaban (ELIQUIS) tablet 5 mg  5 mg Oral BID Clapacs, [name], MD      . cloZAPine (CLOZARIL) tablet 500 mg  500 mg Oral QHS Clapacs, [name], MD      . Melene Muller ON 09/21/2020] fluticasone furoate-vilanterol (BREO ELLIPTA) 200-25 MCG/INH 1 puff  1 puff Inhalation Daily Clapacs, [name], MD      . hydrOXYzine (ATARAX/VISTARIL) tablet 50 mg  50 mg Oral TID PRN Clapacs, [name], MD      . insulin aspart (novoLOG) injection 0-9 Units  0-9 Units Subcutaneous TID WC Clapacs, [name], MD      . insulin detemir (LEVEMIR) injection 10 Units  10 Units Subcutaneous BID Clapacs, [name], MD      . Melene Muller ON 09/21/2020] lisinopril (ZESTRIL) tablet 2.5 mg  2.5 mg Oral Daily Clapacs, [name], MD      . lithium carbonate (ESKALITH) CR tablet 450 mg  450 mg Oral Q12H Clapacs, [name], MD      . magnesium hydroxide (MILK OF MAGNESIA) suspension 30 mL  30 mL Oral Daily PRN Clapacs, [name], MD      . metFORMIN (GLUCOPHAGE) tablet 1,000 mg  1,000 mg Oral BID WC Clapacs, [name], MD      . nicotine polacrilex (NICORETTE) gum 2 mg  2 mg Oral Q4H PRN [name], MD      . OLANZapine [facility]) tablet 10 mg  10 mg Oral BID Clapacs, [name], MD      . propranolol (INDERAL)  tablet 10 mg  10 mg Oral QHS [name], MD      . Melene Muller ON 09/21/2020] sertraline (ZOLOFT) tablet 100 mg  100 mg Oral Daily Les Pou M, MD      . simvastatin (ZOCOR) tablet 40 mg  40 mg Oral q1800 Clapacs, [name],  MD      . traZODone (DESYREL) tablet 100 mg  100 mg Oral QHS Clapacs, [name], MD       PTA Medications: Medications Prior to Admission  Medication Sig Dispense Refill Last Dose  . albuterol (VENTOLIN HFA) 108 (90 Base) MCG/ACT inhaler Inhale 1-2 puffs into the lungs every 6 (six) hours as needed for wheezing or shortness of breath. 18 g 1   . apixaban (ELIQUIS) 5 MG TABS tablet Take 1 tablet (5 mg total) by mouth 2 (two) times daily. 60 tablet 6   . cloZAPine (CLOZARIL) 100 MG tablet Take 5 tablets (500 mg total) by mouth at bedtime. 150 tablet 1   . glucose blood (FORA V30A BLOOD GLUCOSE TEST) test strip Use as instructed 100 each 12   . LEVEMIR FLEXTOUCH 100 UNIT/ML FlexPen Inject 10 Units into the skin 2 (two) times daily.     Marland Kitchen lisinopril (ZESTRIL) 2.5 MG tablet Take 1 tablet (2.5 mg total) by mouth daily. 30 tablet 1   . lithium carbonate (ESKALITH) 450 MG CR tablet Take 1 tablet (450 mg total) by mouth every 12 (twelve) hours. 60 tablet 1   . metFORMIN (GLUCOPHAGE) 1000 MG tablet Take 1 tablet (1,000 mg total) by mouth 2 (two) times daily with a meal. 60 tablet 1   . NOVOFINE AUTOCOVER PEN NEEDLE 30G X 8 MM MISC      . OLANZapine (ZYPREXA) 20 MG tablet Take 20 mg by mouth at bedtime.     . Omega-3 Fatty Acids (FISH OIL) 1000 MG CAPS Take 1 capsule by mouth daily.     . sertraline (ZOLOFT) 50 MG tablet Take 1 tablet (50 mg total) by mouth daily. 30 tablet 1   . simvastatin (ZOCOR) 40 MG tablet Take 1 tablet (40 mg total) by mouth daily at 6 PM. 30 tablet 6   . traZODone (DESYREL) 100 MG tablet Take 1 tablet (100 mg total) by mouth at bedtime. 30 tablet 1     Musculoskeletal: Strength & Muscle Tone: within normal limits Gait & Station: normal Patient leans:  N/A            Psychiatric Specialty Exam:  Presentation  General Appearance: Disheveled  Eye Contact:Minimal  Speech:Clear and Coherent  Speech Volume:Normal  Handedness:Right   Mood and Affect  Mood:Anxious; Depressed  Affect:Congruent; Constricted   Thought Process  Thought Processes:Coherent  Duration of Psychotic Symptoms: Greater than six months  Past Diagnosis of Schizophrenia or Psychoactive disorder: Yes  Descriptions of Associations:Intact  Orientation:Full (Time, Place and Person)  Thought Content:Logical  Hallucinations:Hallucinations: None  Ideas of Reference:Paranoia; Percusatory  Suicidal Thoughts:Suicidal Thoughts: Yes, Active SI Active Intent and/or Plan: With Plan  Homicidal Thoughts:Homicidal Thoughts: No   Sensorium  Memory:Immediate Fair; Recent Fair; Remote Fair  Judgment:Poor  Insight:Shallow   Executive Functions  Concentration:Fair  Attention Span:Fair  Recall:Fair  Fund of Knowledge:Fair  Language:Fair   Psychomotor Activity  Psychomotor Activity:Psychomotor Activity: Restlessness   Assets  Assets:Communication Skills; Desire for Improvement; Financial Resources/Insurance; Housing; Resilience   Sleep  Sleep:Sleep: Good    Physical Exam: Physical Exam Vitals and nursing note reviewed.  Constitutional:      Appearance: Normal appearance.  HENT:     Head: Normocephalic and atraumatic.     Right Ear: External ear normal.     Left Ear: External ear normal.     Nose: Nose normal.     Mouth/Throat:     Mouth: Mucous membranes are moist.  Pharynx: Oropharynx is clear.  Eyes:     Extraocular Movements: Extraocular movements intact.     Conjunctiva/sclera: Conjunctivae normal.     Pupils: Pupils are equal, round, and reactive to light.  Cardiovascular:     Rate and Rhythm: Normal rate.     Pulses: Normal pulses.  Pulmonary:     Effort: Pulmonary effort is normal.     Breath sounds: Normal  breath sounds.  Abdominal:     General: Abdomen is flat.     Palpations: Abdomen is soft.  Musculoskeletal:        General: No swelling. Normal range of motion.     Cervical back: Normal range of motion and neck supple.  Skin:    General: Skin is warm and dry.  Neurological:     General: No focal deficit present.     Mental Status: He is alert and oriented to person, place, and time.  Psychiatric:        Attention and Perception: Perception normal.        Mood and Affect: Mood is anxious.        Speech: Speech normal.        Behavior: Behavior is hyperactive.        Thought Content: Thought content includes suicidal ideation. Thought content includes suicidal plan.        Cognition and Memory: Cognition and memory normal.        Judgment: Judgment is impulsive.    Review of Systems  Constitutional: Positive for malaise/fatigue. Negative for weight loss.  HENT: Negative.   Eyes: Negative.   Respiratory: Negative.   Cardiovascular: Negative.   Gastrointestinal: Negative.   Genitourinary: Negative.   Musculoskeletal: Negative.   Skin: Negative for itching and rash.  Neurological: Negative.   Endo/Heme/Allergies: Positive for environmental allergies. Does not bruise/bleed easily.  Psychiatric/Behavioral: Positive for depression and suicidal ideas. The patient is nervous/anxious and has insomnia.    There were no vitals taken for this visit. There is no height or weight on file to calculate BMI.  Treatment Plan Summary: Daily contact with patient to assess and evaluate symptoms and progress in treatment and Medication management   1) Schizoaffective Disorder, depressive type- established problem, unstable, current suicidal thoughts - Increase Zoloft 100 mg daily, continue Zyprexa 10 mg BID, Clozapien 500 mg QHS, Lithium 450 mg BID (level 1.12- not trough) trazodone 100 mg QHS. Start Propranolol 10 mg QHS for anxiety   2) Diabetes Melltius Type 2 - Continue Metformin 1000 mg  BID, sliding scale insulin, diabetic coordinator consult  3) HTN- established problem, stable - Continue lisinipril 2.5 mg daily  4) Long-term anticoagulation- continue apixiban 5 mg BID  5) Hyperlipidemia- continue Simvastin 40 mg daily at 6PM.   Observation Level/Precautions:  15 minute checks  Laboratory:  Completed in ED  Psychotherapy:    Medications:    Consultations:    Discharge Concerns:    Estimated LOS:  Other:     Physician Treatment Plan for Primary Diagnosis: Schizoaffective disorder, depressive type (HCC) Long Term Goal(s): Improvement in symptoms so as ready for discharge  Short Term Goals: Ability to identify changes in lifestyle to reduce recurrence of condition will improve, Ability to verbalize feelings will improve, Ability to disclose and discuss suicidal ideas, Ability to demonstrate self-control will improve, Ability to identify and develop effective coping behaviors will improve, Ability to maintain clinical measurements within normal limits will improve, Compliance with prescribed medications will improve and Ability to identify triggers associated  with substance abuse/mental health issues will improve  Physician Treatment Plan for Secondary Diagnosis: Principal Problem:   Schizoaffective disorder, depressive type (HCC) Active Problems:   Hypertension   Diabetes mellitus without complication (HCC)   Hyperlipidemia   Chronic anticoagulation   Tobacco use disorder  Long Term Goal(s): Improvement in symptoms so as ready for discharge  Short Term Goals: Ability to identify changes in lifestyle to reduce recurrence of condition will improve, Ability to verbalize feelings will improve, Ability to disclose and discuss suicidal ideas, Ability to demonstrate self-control will improve, Ability to identify and develop effective coping behaviors will improve, Ability to maintain clinical measurements within normal limits will improve, Compliance with prescribed  medications will improve and Ability to identify triggers associated with substance abuse/mental health issues will improve  I certify that inpatient services furnished can reasonably be expected to improve the patient's condition.    [name], MD 4/8/20223:51 PM

## 2020-09-20 NOTE — ED Notes (Signed)
EDP made aware of patient's CGB  (60).

## 2020-09-20 NOTE — BH Assessment (Addendum)
[facility] currently short staffed, unable to accept patient at this time  Referral information for Psychiatric Hospitalization faxed to;   [facility] [phone]), No answer  . [facility] [phone]) Currently under review  . [facility] [phone] -or- [phone]), Denied due to insurance will not cover stay  . [facility] [phone]) Staff reports that patient is currently under review, if not contacted back by 7am call back   [facility] Kim reports no appropriate beds available

## 2020-09-20 NOTE — Consult Note (Signed)
Pharmacy - Clozapine   This patient's order has been reviewed for prescribing contraindications.  Clozapine REMS enrollment Verified: YES 09/20/20 Current Outpatient Monitoring: MONTHLY   Home Regimen:  Clozapine 500 mg HS    Dose Adjustments This Admission:  n/a   Labs:  09/16/20: ANC 3200   Plan:  Continue current prescribed regimen   Continue with weekly ANC labs while inpatient  **The medication is being dispensed pursuant to the FDA REMS suspension order of 05/03/20 that allows for dispensing without a patient REMS dispense authorization (RDA).   Sharen Hones, PharmD, BCPS Clinical Pharmacist

## 2020-09-20 NOTE — Consult Note (Signed)
Pharmacy - Clozapine   This patient's order has been reviewed for prescribing contraindications.  Clozapine REMS enrollment Verified: YES 09/20/20 Current Outpatient Monitoring: MONTHLY   Home Regimen:  Clozapine 500 mg HS    Dose Adjustments This Admission:  n/a   Labs:  09/16/20: ANC 3200   Plan:  Continue current prescribed regimen   Continue with weekly ANC labs while inpatient  **The medication is being dispensed pursuant to the FDA REMS suspension order of 05/03/20 that allows for dispensing without a patient REMS dispense authorization (RDA).   Lacy Sofia E Arabia Nylund, PharmD, BCPS Clinical Pharmacist 

## 2020-09-20 NOTE — Consult Note (Signed)
[facility] Face-to-Face Psychiatry Consult   Reason for Consult: Consult follow-up on this 45 year old man with a history of schizophrenia or schizoaffective disorder Referring Physician: Roxan Hockey Patient Identification: Lance [name] MRN:  [id number] Principal Diagnosis: Schizoaffective disorder, bipolar type (HCC) Diagnosis:  Principal Problem:   Schizoaffective disorder, bipolar type (HCC) Active Problems:   Hypertension   Diabetes mellitus without complication (HCC)   Hyperlipidemia   Chronic anticoagulation   Tobacco use disorder   Alcohol abuse   Total Time spent with patient: 45 minutes  Subjective:   Lance [name] is a 45 y.o. male patient admitted with "I just do not feel comfortable back there.  I am having thoughts of suicide".  HPI: Patient seen chart reviewed.  Patient well-known from previous encounters.  This is a 45 year old man with schizophrenia or schizoaffective disorder who presents to the hospital saying that for about the last week or 2 he has been having what he describes as anxiety attacks occurring at night.  They wake him up from sleep and make him nervous and feel like pacing around.  Additionally he complains that he has been having suicidal thoughts and thoughts of hurting other people recently.  The suicidal thoughts have not had any intent although when pressed he says that he sometimes used to cut himself.  He denies any intent to harm anyone else and has not done anything violent.  He says he is not having hallucinations but claims that the people at the group home do not like him.  He has a hard time really pinning down any single specific grievance but seems to feel like somehow he is just not treated well.  Patient is disheveled looks very dysphoric and nervous.  He says he has been taking his medicine.  Denies any drug or alcohol use recently.  Denies active hallucinations.  Appears disorganized in his thought very dysphoric.  Past Psychiatric History:  Long history of mental illness diagnosis of schizoaffective disorder.  Frequently presents with similar complaints of psychotic symptoms paranoia as well as suicidal ideation.  Sometimes has some manic-like symptoms but mostly depression with agitation.  Past history of cutting and self injury.  Minimal past history of aggression even when he talks about it.  Used to drink or use marijuana regularly but has not been doing much of that lately.  Risk to Self:   Risk to Others:   Prior Inpatient Therapy:   Prior Outpatient Therapy:    Past Medical History:  Past Medical History:  Diagnosis Date  . Depression   . Diabetes mellitus without complication (HCC)   . Hyperlipidemia   . Hypertension   . Lupus anticoagulant disorder (HCC) 06/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  . PE (pulmonary thromboembolism) (HCC)   . Schizo affective schizophrenia (HCC)   . Supratherapeutic INR 11/19/2016   History reviewed. No pertinent surgical history. Family History:  Family History  Problem Relation Age of Onset  . CAD Mother   . CAD Sister    Family Psychiatric  History: None reported Social History:  Social History   Substance and Sexual Activity  Alcohol Use No   Comment: occassionally     Social History   Substance and Sexual Activity  Drug Use Not Currently  . Types: Marijuana   Comment: Last use 11/29/16    Social History   Socioeconomic History  . Marital status: Single    Spouse name: Not on file  . Number of children: Not on file  .  Years of education: Not on file  . Highest education level: Not on file  Occupational History  . Not on file  Tobacco Use  . Smoking status: Current Every Day Smoker    Packs/day: 1.00    Years: 23.00    Pack years: 23.00    Types: Cigarettes  . Smokeless tobacco: Never Used  . Tobacco comment: will provide material  Substance and Sexual Activity  . Alcohol use: No    Comment: occassionally  . Drug use: Not Currently    Types:  Marijuana    Comment: Last use 11/29/16  . Sexual activity: Never    Birth control/protection: None    Comment: occasional marijuana- none recently  Other Topics Concern  . Not on file  Social History Narrative   From a group home in [place]   Social Determinants of Health   Financial Resource Strain: Not on file  Food Insecurity: Not on file  Transportation Needs: Not on file  Physical Activity: Not on file  Stress: Not on file  Social Connections: Not on file   Additional Social History:    Allergies:   Allergies  Allergen Reactions  . Penicillins Other (See Comments)    Reaction: "lockjaw" Has patient had a PCN reaction causing immediate rash, facial/tongue/throat swelling, SOB or lightheadedness with hypotension: No Has patient had a PCN reaction causing severe rash involving mucus membranes or skin necrosis: No Has patient had a PCN reaction that required hospitalization: No Has patient had a PCN reaction occurring within the last 10 years: No If all of the above answers are "NO", then may proceed with Cephalosporin use.     Labs:  Results for orders placed or performed during the hospital encounter of 09/19/20 (from the past 48 hour(s))  Comprehensive metabolic panel     Status: Abnormal   Collection Time: 09/19/20  6:56 PM  Result Value Ref Range   Sodium 138 135 - 145 mmol/L   Potassium 3.6 3.5 - 5.1 mmol/L   Chloride 107 98 - 111 mmol/L   CO2 21 (L) 22 - 32 mmol/L   Glucose, Bld 206 (H) 70 - 99 mg/dL    Comment: Glucose reference range applies only to samples taken after fasting for at least 8 hours.   BUN 21 (H) 6 - 20 mg/dL   Creatinine, Ser 1.61 0.61 - 1.24 mg/dL   Calcium 9.7 8.9 - 09.6 mg/dL   Total Protein 6.9 6.5 - 8.1 g/dL   Albumin 4.6 3.5 - 5.0 g/dL   AST 18 15 - 41 U/L   ALT 19 0 - 44 U/L   Alkaline Phosphatase 67 38 - 126 U/L   Total Bilirubin 0.4 0.3 - 1.2 mg/dL   GFR, Estimated >04 >54 mL/min    Comment: (NOTE) Calculated using the  CKD-EPI Creatinine Equation (2021)    Anion gap 10 5 - 15    Comment: Performed at [facility], [address], [place], [place] [address]  Ethanol     Status: None   Collection Time: 09/19/20  6:56 PM  Result Value Ref Range   Alcohol, Ethyl (B) <10 <10 mg/dL    Comment: (NOTE) Lowest detectable limit for serum alcohol is 10 mg/dL.  For medical purposes only. Performed at [facility], [address] [place], [place] [address]   Salicylate level     Status: Abnormal   Collection Time: 09/19/20  6:56 PM  Result Value Ref Range   Salicylate Lvl <7.0 (L) 7.0 -  30.0 mg/dL    Comment: Performed at [facility], [address] [place] [address] [place], [place] [address]  Acetaminophen level     Status: Abnormal   Collection Time: 09/19/20  6:56 PM  Result Value Ref Range   Acetaminophen (Tylenol), Serum <10 (L) 10 - 30 ug/mL    Comment: (NOTE) Therapeutic concentrations vary significantly. A range of 10-30 ug/mL  may be an effective concentration for many patients. However, some  are best treated at concentrations outside of this range. Acetaminophen concentrations >150 ug/mL at 4 hours after ingestion  and >50 ug/mL at 12 hours after ingestion are often associated with  toxic reactions.  Performed at [facility], [address] [place], [place] [address]   cbc     Status: None   Collection Time: 09/19/20  6:56 PM  Result Value Ref Range   WBC 5.4 4.0 - 10.5 K/uL   RBC 4.67 4.22 - 5.81 MIL/uL   Hemoglobin 15.3 13.0 - 17.0 g/dL   HCT 29.5 62.1 - 30.8 %   MCV 92.1 80.0 - 100.0 fL   MCH 32.8 26.0 - 34.0 pg   MCHC 35.6 30.0 - 36.0 g/dL   RDW 65.7 84.6 - 96.2 %   Platelets 216 150 - 400 K/uL   nRBC 0.0 0.0 - 0.2 %    Comment: Performed at [facility], [address], [place], [place] [address]  Urine Drug Screen, Qualitative     Status: Abnormal   Collection Time: 09/19/20  6:56 PM  Result Value Ref Range   Tricyclic, Ur Screen POSITIVE  (A) NONE DETECTED   Amphetamines, Ur Screen NONE DETECTED NONE DETECTED   MDMA (Ecstasy)Ur Screen NONE DETECTED NONE DETECTED   Cocaine Metabolite,Ur [place] NONE DETECTED NONE DETECTED   Opiate, Ur Screen NONE DETECTED NONE DETECTED   Phencyclidine (PCP) Ur S NONE DETECTED NONE DETECTED   Cannabinoid 50 Ng, Ur [place] NONE DETECTED NONE DETECTED   Barbiturates, Ur Screen NONE DETECTED NONE DETECTED   Benzodiazepine, Ur Scrn NONE DETECTED NONE DETECTED   Methadone Scn, Ur NONE DETECTED NONE DETECTED    Comment: (NOTE) Tricyclics + metabolites, urine    Cutoff 1000 ng/mL Amphetamines + metabolites, urine  Cutoff 1000 ng/mL MDMA (Ecstasy), urine              Cutoff 500 ng/mL Cocaine Metabolite, urine          Cutoff 300 ng/mL Opiate + metabolites, urine        Cutoff 300 ng/mL Phencyclidine (PCP), urine         Cutoff 25 ng/mL Cannabinoid, urine                 Cutoff 50 ng/mL Barbiturates + metabolites, urine  Cutoff 200 ng/mL Benzodiazepine, urine              Cutoff 200 ng/mL Methadone, urine                   Cutoff 300 ng/mL  The urine drug screen provides only a preliminary, unconfirmed analytical test result and should not be used for non-medical purposes. Clinical consideration and professional judgment should be applied to any positive drug screen result due to possible interfering substances. A more specific alternate chemical method must be used in order to obtain a confirmed analytical result. Gas chromatography / mass spectrometry (GC/MS) is the preferred confirm atory method. Performed at [facility], [address] [place], [place] [address]   Lithium level  Status: None   Collection Time: 09/19/20  6:56 PM  Result Value Ref Range   Lithium Lvl 1.12 0.60 - 1.20 mmol/L    Comment: Performed at [facility], [address] [place], [place] [address]  Resp Panel by RT-PCR (Flu A&B, Covid) Nasopharyngeal Swab     Status: None   Collection Time: 09/19/20   8:06 PM   Specimen: Nasopharyngeal Swab; Nasopharyngeal(NP) swabs in vial transport medium  Result Value Ref Range   SARS Coronavirus 2 by RT PCR NEGATIVE NEGATIVE    Comment: (NOTE) SARS-CoV-2 target nucleic acids are NOT DETECTED.  The SARS-CoV-2 RNA is generally detectable in upper respiratory specimens during the acute phase of infection. The lowest concentration of SARS-CoV-2 viral copies this assay can detect is 138 copies/mL. A negative result does not preclude SARS-Cov-2 infection and should not be used as the sole basis for treatment or other patient management decisions. A negative result may occur with  improper specimen collection/handling, submission of specimen other than nasopharyngeal swab, presence of viral mutation(s) within the areas targeted by this assay, and inadequate number of viral copies(<138 copies/mL). A negative result must be combined with clinical observations, patient history, and epidemiological information. The expected result is Negative.  Fact Sheet for Patients:  BloggerCourse.comhttps://www.fda.gov/media/152166/download  Fact Sheet for Healthcare Providers:  SeriousBroker.ithttps://www.fda.gov/media/152162/download  This test is no t yet approved or cleared by the [place] FDA and  has been authorized for detection and/or diagnosis of SARS-CoV-2 by FDA under an Emergency Use Authorization (EUA). This EUA will remain  in effect (meaning this test can be used) for the duration of the COVID-19 declaration under Section 564(b)(1) of the Act, 21 U.S.C.section 360bbb-3(b)(1), unless the authorization is terminated  or revoked sooner.       Influenza A by PCR NEGATIVE NEGATIVE   Influenza B by PCR NEGATIVE NEGATIVE    Comment: (NOTE) The Xpert Xpress SARS-CoV-2/FLU/RSV plus assay is intended as an aid in the diagnosis of influenza from Nasopharyngeal swab specimens and should not be used as a sole basis for treatment. Nasal washings and aspirates are unacceptable for  Xpert Xpress SARS-CoV-2/FLU/RSV testing.  Fact Sheet for Patients: BloggerCourse.comhttps://www.fda.gov/media/152166/download  Fact Sheet for Healthcare Providers: SeriousBroker.ithttps://www.fda.gov/media/152162/download  This test is not yet approved or cleared by the [place] FDA and has been authorized for detection and/or diagnosis of SARS-CoV-2 by FDA under an Emergency Use Authorization (EUA). This EUA will remain in effect (meaning this test can be used) for the duration of the COVID-19 declaration under Section 564(b)(1) of the Act, 21 U.S.C. section 360bbb-3(b)(1), unless the authorization is terminated or revoked.  Performed at [facility], [address] [place], [place] [address]   CBC with Differential/Platelet     Status: None   Collection Time: 09/19/20 11:51 PM  Result Value Ref Range   WBC 5.8 4.0 - 10.5 K/uL   RBC 4.52 4.22 - 5.81 MIL/uL   Hemoglobin 14.6 13.0 - 17.0 g/dL   HCT 78.241.5 95.639.0 - 21.352.0 %   MCV 91.8 80.0 - 100.0 fL   MCH 32.3 26.0 - 34.0 pg   MCHC 35.2 30.0 - 36.0 g/dL   RDW 08.612.8 57.811.5 - 46.915.5 %   Platelets 215 150 - 400 K/uL   nRBC 0.0 0.0 - 0.2 %   Neutrophils Relative % 57 %   Neutro Abs 3.2 1.7 - 7.7 K/uL   Lymphocytes Relative 28 %   Lymphs Abs 1.6 0.7 - 4.0 K/uL   Monocytes Relative 15 %  Monocytes Absolute 0.8 0.1 - 1.0 K/uL   Eosinophils Relative 0 %   Eosinophils Absolute 0.0 0.0 - 0.5 K/uL   Basophils Relative 0 %   Basophils Absolute 0.0 0.0 - 0.1 K/uL   Immature Granulocytes 0 %   Abs Immature Granulocytes 0.02 0.00 - 0.07 K/uL    Comment: Performed at [facility], [address], [place], [place] [address]    Current Facility-Administered Medications  Medication Dose Route Frequency Provider Last Rate Last Admin  . albuterol (VENTOLIN HFA) 108 (90 Base) MCG/ACT inhaler 1-2 puff  1-2 puff Inhalation Q6H PRN Chesley Noon, MD      . apixaban Everlene Balls) tablet 5 mg  5 mg Oral BID Tressie Ellis, RPH   5 mg at 09/20/20 4696  .  cloZAPine (CLOZARIL) tablet 500 mg  500 mg Oral QHS Chesley Noon, MD   500 mg at 09/20/20 0034  . fluticasone furoate-vilanterol (BREO ELLIPTA) 200-25 MCG/INH 1 puff  1 puff Inhalation Daily Chesley Noon, MD   1 puff at 09/20/20 [phone]  . insulin detemir (LEVEMIR) injection 10 Units  10 Units Subcutaneous BID Demiyah Fischbach T, MD      . lisinopril (ZESTRIL) tablet 2.5 mg  2.5 mg Oral Daily Chesley Noon, MD   2.5 mg at 09/20/20 0906  . lithium carbonate (ESKALITH) CR tablet 450 mg  450 mg Oral Q12H Chesley Noon, MD   450 mg at 09/20/20 0906  . metFORMIN (GLUCOPHAGE) tablet 1,000 mg  1,000 mg Oral BID WC Chesley Noon, MD   1,000 mg at 09/20/20 0906  . OLANZapine (ZYPREXA) tablet 10 mg  10 mg Oral BID Chesley Noon, MD   10 mg at 09/20/20 0906  . sertraline (ZOLOFT) tablet 50 mg  50 mg Oral Daily Chesley Noon, MD   50 mg at 09/20/20 0906  . simvastatin (ZOCOR) tablet 40 mg  40 mg Oral q1800 Chesley Noon, MD      . traZODone (DESYREL) tablet 100 mg  100 mg Oral QHS Chesley Noon, MD   100 mg at 09/19/20 2209   Current Outpatient Medications  Medication Sig Dispense Refill  . albuterol (VENTOLIN HFA) 108 (90 Base) MCG/ACT inhaler Inhale 1-2 puffs into the lungs every 6 (six) hours as needed for wheezing or shortness of breath. 18 g 1  . apixaban (ELIQUIS) 5 MG TABS tablet Take 1 tablet (5 mg total) by mouth 2 (two) times daily. 60 tablet 6  . cloZAPine (CLOZARIL) 100 MG tablet Take 5 tablets (500 mg total) by mouth at bedtime. 150 tablet 1  . glucose blood (FORA V30A BLOOD GLUCOSE TEST) test strip Use as instructed 100 each 12  . LEVEMIR FLEXTOUCH 100 UNIT/ML FlexPen Inject 10 Units into the skin 2 (two) times daily.    Marland Kitchen lisinopril (ZESTRIL) 2.5 MG tablet Take 1 tablet (2.5 mg total) by mouth daily. 30 tablet 1  . lithium carbonate (ESKALITH) 450 MG CR tablet Take 1 tablet (450 mg total) by mouth every 12 (twelve) hours. 60 tablet 1  . metFORMIN (GLUCOPHAGE) 1000 MG tablet Take  1 tablet (1,000 mg total) by mouth 2 (two) times daily with a meal. 60 tablet 1  . NOVOFINE AUTOCOVER PEN NEEDLE 30G X 8 MM MISC     . OLANZapine (ZYPREXA) 20 MG tablet Take 20 mg by mouth at bedtime.    . Omega-3 Fatty Acids (FISH OIL) 1000 MG CAPS Take 1 capsule by mouth daily.    . sertraline (ZOLOFT) 50 MG tablet  Take 1 tablet (50 mg total) by mouth daily. 30 tablet 1  . simvastatin (ZOCOR) 40 MG tablet Take 1 tablet (40 mg total) by mouth daily at 6 PM. 30 tablet 6  . traZODone (DESYREL) 100 MG tablet Take 1 tablet (100 mg total) by mouth at bedtime. 30 tablet 1    Musculoskeletal: Strength & Muscle Tone: within normal limits Gait & Station: normal Patient leans: N/A            Psychiatric Specialty Exam:  Presentation  General Appearance: Disheveled  Eye Contact:Good  Speech:Clear and Coherent; Normal Rate  Speech Volume:Normal  Handedness:Right   Mood and Affect  Mood:Anxious; Depressed; Hopeless  Affect:Appropriate; Non-Congruent   Thought Process  Thought Processes:Coherent  Descriptions of Associations:Intact  Orientation:No data recorded Thought Content:Abstract Reasoning; Logical  History of Schizophrenia/Schizoaffective disorder:Yes  Duration of Psychotic Symptoms:Greater than six months  Hallucinations:Hallucinations: None  Ideas of Reference:None  Suicidal Thoughts:Suicidal Thoughts: Yes, Active SI Active Intent and/or Plan: With Plan  Homicidal Thoughts:Homicidal Thoughts: No   Sensorium  Memory:Immediate Good; Recent Good; Remote Good  Judgment:Poor  Insight:Poor   Executive Functions  Concentration:Good  Attention Span:Good  Recall:Good  Fund of Knowledge:Good  Language:Good   Psychomotor Activity  Psychomotor Activity:Psychomotor Activity: Normal   Assets  Assets:Housing; Communication Skills   Sleep  Sleep:Sleep: Good   Physical Exam: Physical Exam Vitals and nursing note reviewed.   Constitutional:      Appearance: Normal appearance.  HENT:     Head: Normocephalic and atraumatic.     Mouth/Throat:     Pharynx: Oropharynx is clear.  Eyes:     Pupils: Pupils are equal, round, and reactive to light.  Cardiovascular:     Rate and Rhythm: Normal rate and regular rhythm.  Pulmonary:     Effort: Pulmonary effort is normal.     Breath sounds: Normal breath sounds.  Abdominal:     General: Abdomen is flat.     Palpations: Abdomen is soft.  Musculoskeletal:        General: Normal range of motion.  Skin:    General: Skin is warm and dry.  Neurological:     General: No focal deficit present.     Mental Status: He is alert. Mental status is at baseline.  Psychiatric:        Attention and Perception: He is inattentive.        Mood and Affect: Mood is anxious and depressed.        Speech: Speech is tangential.        Behavior: Behavior is agitated. Behavior is not aggressive or hyperactive.        Thought Content: Thought content is paranoid. Thought content includes suicidal ideation. Thought content does not include homicidal ideation. Thought content includes suicidal plan.        Cognition and Memory: Cognition is impaired.        Judgment: Judgment is impulsive.    Review of Systems  Constitutional: Negative.   HENT: Negative.   Eyes: Negative.   Respiratory: Negative.   Cardiovascular: Negative.   Gastrointestinal: Negative.   Musculoskeletal: Negative.   Skin: Negative.   Neurological: Negative.   Psychiatric/Behavioral: Positive for depression and suicidal ideas. Negative for hallucinations and substance abuse. The patient is nervous/anxious and has insomnia.    Blood pressure 120/78, pulse 73, temperature 98.1 F (36.7 C), temperature source Oral, resp. rate 18, height 5\' 10"  (1.778 m), weight 86.2 kg, SpO2 98 %. Body mass index is 27.26  kg/m.  Treatment Plan Summary: Medication management and Plan Patient with schizoaffective disorder depressed  type also has several medical problems including chronic anticoagulation on Eliquis, diabetes relatively well controlled but requiring a little bit of insulin still, hypertension.  Medically seems to be pretty stable.  Takes clozapine regularly for his psychosis.  Not clear why he is doing worse recently but that is not unusual.  The patient goes through phases of feeling bad from time to time.  Plan will be for admission to the psychiatric ward.  He tells me that he wishes he could be sent to a hospital where he could be kept for "a long time".  I explained to him that all hospitals these days work on the model of keeping people just as long as it is necessary and that a long-term psychiatric hospitalization is not indicated at this point for him.  He will be admitted to our unit.  Case reviewed with emergency room physician TTS and staff downstairs.  Ordered labs including hemoglobin A1c.  I will order glycemic control at least for the next couple days to make sure he is stable with the orders as written.  Disposition: Recommend psychiatric Inpatient admission when medically cleared. Supportive therapy provided about ongoing stressors.  Mordecai Rasmussen, MD 09/20/2020 11:50 AM

## 2020-09-20 NOTE — Progress Notes (Signed)
Patient is calm and cooperative with admission assessment. Patient endorses passive SI with no plan. He denies HI and AVH. He rates his depression as an 8/10 and anxiety as a 5/10. He states his stressors are conflict with other group home residents, "not being able to have a cigarette", and "no freedom".   Skin assessment was done with Aundra Millet, RN. Patient has a tattoo on his chest.   Patient remains safe on the unit at this time and q15 min safety checks are maintained.

## 2020-09-21 DIAGNOSIS — F251 Schizoaffective disorder, depressive type: Secondary | ICD-10-CM | POA: Diagnosis not present

## 2020-09-21 LAB — GLUCOSE, CAPILLARY
Glucose-Capillary: 132 mg/dL — ABNORMAL HIGH (ref 70–99)
Glucose-Capillary: 61 mg/dL — ABNORMAL LOW (ref 70–99)
Glucose-Capillary: 90 mg/dL (ref 70–99)
Glucose-Capillary: 95 mg/dL (ref 70–99)

## 2020-09-21 MED ORDER — PROPRANOLOL HCL 20 MG PO TABS
20.0000 mg | ORAL_TABLET | Freq: Every day | ORAL | Status: DC
  Administered 2020-09-21: 20 mg via ORAL
  Filled 2020-09-21: qty 1

## 2020-09-21 MED ORDER — ONDANSETRON HCL 4 MG PO TABS: 8.0000 mg | ORAL_TABLET | Freq: Three times a day (TID) | ORAL | Status: DC | PRN

## 2020-09-21 MED ORDER — INSULIN DETEMIR 100 UNIT/ML ~~LOC~~ SOLN
6.0000 [IU] | Freq: Two times a day (BID) | SUBCUTANEOUS | Status: DC
  Administered 2020-09-22 – 2020-09-26 (×9): 6 [IU] via SUBCUTANEOUS
  Filled 2020-09-21 (×12): qty 0.06

## 2020-09-21 NOTE — [facility] Counselor (Signed)
Adult Comprehensive Assessment  Patient ID: Lance [name], male   DOB: [birth year], 45 y.o.   MRN: [id number]  Information Source: Information source: Patient  Current Stressors:  Patient states their primary concerns and needs for treatment are:: "get anxiety under control" "get help with anxious thoughts, at night when sleeping" Patient states their goals for this hospitilization and ongoing recovery are:: "get a new group home to live int" Educational / Learning stressors: Pt denies Employment / Job issues: Pt denies Family Relationships: Pt denies Surveyor, quantity / Lack of resources (include bankruptcy): "No finances" Housing / Lack of housing: "get out of my group home" Physical health (include injuries & life threatening diseases): diabetes Social relationships: Pt denies Substance abuse: "cigarettes" Bereavement / Loss: "mama, a few months ago"  Living/Environment/Situation:  Living Arrangements: Group Home Living conditions (as described by patient or guardian): "horrible" How long has patient lived in current situation?: 2 years What is atmosphere in current home: Chaotic  Family History:  Marital status: Single Are you sexually active?: No What is your sexual orientation?: Pt denies Has your sexual activity been affected by drugs, alcohol, medication, or emotional stress?: Pt denies Does patient have children?: No  Childhood History:  By whom was/is the patient raised?: Both parents Additional childhood history information: "raised by foster homes and group homes" Description of patient's relationship with caregiver when they were a child: "alcoholic parents, negative" Patient's description of current relationship with people who raised him/her: Deceased How were you disciplined when you got in trouble as a child/adolescent?: Pt denies Does patient have siblings?: Yes Number of Siblings: 1 (sister) Description of patient's current relationship with siblings: Deceased Did  patient suffer any verbal/emotional/physical/sexual abuse as a child?: Yes ("a little bit") Did patient suffer from severe childhood neglect?: No Has patient ever been sexually abused/assaulted/raped as an adolescent or adult?: No Was the patient ever a victim of a crime or a disaster?: No Witnessed domestic violence?: No Has patient been affected by domestic violence as an adult?: No  Education:  Highest grade of school patient has completed: "8/9th grade" Currently a student?: No Learning disability?: Yes (adhd) What learning problems does patient have?: ADHD  Employment/Work Situation:   Employment situation: On disability Why is patient on disability: Mental health How long has patient been on disability: "since my early 20's" Patient's job has been impacted by current illness: No What is the longest time patient has a held a job?: Pt denies Has patient ever been in the [employer]?: No  Financial Resources:   Financial resources: Counselling psychologist Does patient have a Lawyer or guardian?: Yes Name of representative payee or guardian: Lance [name]  Alcohol/Substance Abuse:   What has been your use of drugs/alcohol within the last 12 months?: "drink one beer from time to time, every few weeks" If attempted suicide, did drugs/alcohol play a role in this?: No Alcohol/Substance Abuse Treatment Hx: Denies past history Has alcohol/substance abuse ever caused legal problems?: No  Social Support System:   Conservation officer, nature Support System: Fair Development worker, community Support System: "my niece, guardian, my friend Lance [name]" Type of faith/religion: "i don't know" How does patient's faith help to cope with current illness?: "belive in aliens, gives me inner peace"  Leisure/Recreation:   Do You Have Hobbies?: Yes Leisure and Hobbies: "guitar, Mining engineer, acoustic"  Strengths/Needs:   What is the patient's perception of their strengths?: "I don't know" Patient states they  can use these personal strengths during their treatment to contribute to their recovery: "  I don't know" Patient states these barriers may affect/interfere with their treatment: Pt denies Patient states these barriers may affect their return to the community: Pt reports not wanting to go back to his group home  Discharge Plan:   Currently receiving community mental health services: No (Pt denies, but has an active ACTT team) Patient states concerns and preferences for aftercare planning are: "want someone to talk to" Does patient have access to transportation?: Yes Does patient have financial barriers related to discharge medications?: No Plan for no access to transportation at discharge: CSW will coordinate transportation for pt Will patient be returning to same living situation after discharge?: Yes  Summary/Recommendations:   Summary and Recommendations (to be completed by the evaluator): Patient is a 45 year old male [place], [place] [facility] He reports that he receives SSDI has Medicare part A and part B. He presents to the hospital following suicidal ideation and plan, along with homicidal ideation. He has a primary diagnosis of schizoaffective disorder, depressive type. Pt reports that he wants to leave his group home.  Pt stated that he has been very anxious, especially at nighttime, which has resulted in poor sleep. Pt stated that he wants to continue therapy in the community. Recommendations include: crisis stabilization, therapeutic milieu, encourage group attendance and participation, medication management for detox/mood stabilization and development of comprehensive mental wellness/sobriety plan.  Lance [name]. 09/21/2020

## 2020-09-21 NOTE — Progress Notes (Signed)
At approximately 09:45 pt began punching walls with his right fist, first punched the metal plate on the wall next to the seclusion room, then walked down the hall that rooms L19-L24 (319-324)/yellow hall, punching the hallway walls. Pt states, "I'm pissed off!" "I don't want to go back to the group home and the doctor wants to send me back there! That ain't going to work!" Pt earlier in the morning during medication administration, calmly and casually mentioned that he hopes to be able to go to, [facility]," when he discharges because, "people at the group home don't like me and I don't like them."   Nursing students and instructor were asked to clear the hallway that pt was in (L19-L24)/yellow hall, and go to the craft room to gather until pt de-escalates. The unit psychiatrist witnessed some of this and went to talk to pt in the hallway to also talk to and verbally de-escalate pt. Pt continued to pace and hit walls, therefore pt was placed in back hallway just past room 314/green hall, in the small dayroom, for a voluntary time out that pt agreed and this only went on for about 10 mins with MHT x2 present, they were finally able to de-escalate pt verbally and pt returned to the main milieu area.   Pt stopped punching the walls but continued to pace as per his usual behavior. Will continue to monitor pt per Q15 minute face checks and monitor for safety and progress.   As the day progressive, pt's anger, agitation, hostility, and anxiety about discharge has subsided and no further agitation noted, however, pt continues to pace the hallways, but much slower.   Will continue to monitor pt per Q15 minute face checks and monitor for safety and progress.   At lunch pt's BS was low, pt was asymptomatic CBG = 61, given lunch and juice.

## 2020-09-21 NOTE — [facility] Suicide Risk Assessment (Signed)
[facility] INPATIENT:  Family/Significant Other Suicide Prevention Education  Suicide Prevention Education:  Contact Attempts: Anna,neice (name of family member/significant other) has been identified by the patient as the family member/significant other with whom the patient will be residing, and identified as the person(s) who will aid the patient in the event of a mental health crisis.  With written consent from the patient, two attempts were made to provide suicide prevention education, prior to and/or following the patient's discharge.  We were unsuccessful in providing suicide prevention education.  A suicide education pamphlet was given to the patient to share with family/significant other.  Date and time of first attempt:09/21/20/11:40am Date and time of second attempt:  Lance [name] 09/21/2020, 11:42 AM

## 2020-09-21 NOTE — Progress Notes (Signed)
Inpatient Diabetes Program Recommendations  AACE/ADA: New Consensus Statement on Inpatient Glycemic Control (2015)  Target Ranges:  Prepandial:   less than 140 mg/dL      Peak postprandial:   less than 180 mg/dL (1-2 hours)      Critically ill patients:  140 - 180 mg/dL   Lab Results  Component Value Date   GLUCAP 61 (L) 09/21/2020   HGBA1C 5.1 09/20/2020    Review of Glycemic Control Results for [name], [name] (MRN [id number]) as of 09/21/2020 12:26  Ref. Range 09/20/2020 12:55 09/20/2020 14:49 09/20/2020 16:09 09/21/2020 06:52 09/21/2020 11:29  Glucose-Capillary Latest Ref Range: 70 - 99 mg/dL 60 (L) 389 (H) 373 (H) 132 (H) 61 (L)   Diabetes history: DM Outpatient Diabetes medications: Levemir 10 units bid + Metformin 1 gm bid Current orders for Inpatient glycemic control: Levemir 10 units bid + Novolog correction 0-9 tid + Metformin 1 gm bid  Inpatient Diabetes Program Recommendations:   Consider decrease Levemir to 6 units bid  Thank you, Lance [name]. Lance Mak, RN, MSN, CDE  Diabetes Coordinator Inpatient Glycemic Control Team Team Pager [phone] (8am-5pm) 09/21/2020 12:28 PM

## 2020-09-21 NOTE — Progress Notes (Signed)
Patient is quiet and reserved. Has been pacing the halls, but is no behavior issue. Denies HI  AVH and pain at this encounter. He endorse passive SI and reports not having control of his own life is depressing and makes life not worth living. Also reports having anxiety that he reports keeps him from sleeping at night.  Was focused on getting medication for anxiety and sleep at this encounter. Received his medication and tolerated his meds without incident. Remains safe at this time with 15 minute safety rounds and was encouraged to come to staff with any concerns.    Cleo Butler-Nicholson, LPN

## 2020-09-21 NOTE — Plan of Care (Signed)
Patient stayed in the milieu until bedtime. Alert and oriented x 4. Cooperative and denied suicidal homicidal thoughts. Patient mentioned that he is not happy at his current group home,they don't treat him well. He reported that this triggers his depression and his hallucinations. Patient ate a snack, received medications and went to bed without any further concerns. Currently sleeping and safety maintained.

## 2020-09-21 NOTE — Progress Notes (Addendum)
[facility]  09/21/2020 10:16 AM Lance [name]  MRN:  [id number]   CC [facility]  Subjective:  45 year old male presenting with worsening anxiety, depression, and suicidal ideations with plan to cut himself. This morning patient seen twice. On initial interview, he states that propranolol was somewhat helpful overnight for anxiety, but he continues to have some panic symptoms at bedtime. He requests increase in dose. He denies any side effects for medicine. He states he is suicidal still today, but notes there is nothing to kill himself with in hospital, and contracts for safety. He denies any homicidal ideations, visual hallucinations, or auditory hallucinations. He states he cannot contract for safety of himself or anyone in the group home if discharged. He feels he cannot return to same group home. Explained that patient may have to go back temporarily as his guardian and ACT Team search for new group home similar to last admission. Patient stated he would like to discuss at treatment team. This interview terminated without incident. However, he was later observed punching the walls and yelling in the hallway. He was extremely angry about possibility of needing to return to group home. He eventually was able to verbally deescalate after clarifying that I was not planning to discharge him today. He was able to speak with nurse techs for awhile, and calm self.   Principal Problem: Schizoaffective disorder, depressive type (HCC) Diagnosis: Principal Problem:   Schizoaffective disorder, depressive type (HCC) Active Problems:   Hypertension   Diabetes mellitus without complication (HCC)   Hyperlipidemia   Chronic anticoagulation   Tobacco use disorder  Total Time spent with patient: 30 minutes  Past Psychiatric History: See H&P  Past Medical History:  Past Medical History:  Diagnosis Date  . Depression   . Diabetes mellitus without complication (HCC)   .  Hyperlipidemia   . Hypertension   . Lupus anticoagulant disorder (HCC) 06/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  . PE (pulmonary thromboembolism) (HCC)   . Schizo affective schizophrenia (HCC)   . Supratherapeutic INR 11/19/2016   History reviewed. No pertinent surgical history. Family History:  Family History  Problem Relation Age of Onset  . CAD Mother   . CAD Sister    Family Psychiatric  History: See H&P Social History:  Social History   Substance and Sexual Activity  Alcohol Use No   Comment: occassionally     Social History   Substance and Sexual Activity  Drug Use Not Currently  . Types: Marijuana   Comment: Last use 11/29/16    Social History   Socioeconomic History  . Marital status: Single    Spouse name: Not on file  . Number of children: Not on file  . Years of education: Not on file  . Highest education level: Not on file  Occupational History  . Not on file  Tobacco Use  . Smoking status: Current Every Day Smoker    Packs/day: 1.00    Years: 23.00    Pack years: 23.00    Types: Cigarettes  . Smokeless tobacco: Never Used  . Tobacco comment: will provide material  Substance and Sexual Activity  . Alcohol use: No    Comment: occassionally  . Drug use: Not Currently    Types: Marijuana    Comment: Last use 11/29/16  . Sexual activity: Never    Birth control/protection: None    Comment: occasional marijuana- none recently  Other Topics Concern  .  Not on file  Social History Narrative   From a group home in [place]   Social Determinants of Health   Financial Resource Strain: Not on file  Food Insecurity: Not on file  Transportation Needs: Not on file  Physical Activity: Not on file  Stress: Not on file  Social Connections: Not on file   Additional Social History:                         Sleep: Fair  Appetite:  Good  Current Medications: Current Facility-Administered Medications  Medication Dose Route Frequency  Provider Last Rate Last Admin  . acetaminophen (TYLENOL) tablet 650 mg  650 mg Oral Q6H PRN Clapacs, [name], MD      . albuterol (VENTOLIN HFA) 108 (90 Base) MCG/ACT inhaler 1-2 puff  1-2 puff Inhalation Q6H PRN Clapacs, [name], MD      . alum & mag hydroxide-simeth (MAALOX/MYLANTA) 200-200-20 MG/5ML suspension 30 mL  30 mL Oral Q4H PRN Clapacs, [name], MD      . apixaban (ELIQUIS) tablet 5 mg  5 mg Oral BID Clapacs, [name], MD   5 mg at 09/21/20 0831  . cloZAPine (CLOZARIL) tablet 500 mg  500 mg Oral QHS Clapacs, [name], MD   500 mg at 09/20/20 2134  . fluticasone furoate-vilanterol (BREO ELLIPTA) 200-25 MCG/INH 1 puff  1 puff Inhalation Daily Clapacs, [name], MD   1 puff at 09/21/20 [phone]  . hydrOXYzine (ATARAX/VISTARIL) tablet 50 mg  50 mg Oral TID PRN Clapacs, [name], MD   50 mg at 09/21/20 0837  . insulin aspart (novoLOG) injection 0-9 Units  0-9 Units Subcutaneous TID WC Clapacs, [name], MD   1 Units at 09/21/20 0715  . insulin detemir (LEVEMIR) injection 10 Units  10 Units Subcutaneous BID Clapacs, [name], MD   10 Units at 09/21/20 [phone]  . lisinopril (ZESTRIL) tablet 2.5 mg  2.5 mg Oral Daily Clapacs, [name], MD   2.5 mg at 09/21/20 0831  . lithium carbonate (ESKALITH) CR tablet 450 mg  450 mg Oral Q12H Clapacs, [name], MD   450 mg at 09/21/20 0831  . magnesium hydroxide (MILK OF MAGNESIA) suspension 30 mL  30 mL Oral Daily PRN Clapacs, [name], MD      . metFORMIN (GLUCOPHAGE) tablet 1,000 mg  1,000 mg Oral BID WC Clapacs, [name], MD   1,000 mg at 09/21/20 0831  . nicotine polacrilex (NICORETTE) gum 2 mg  2 mg Oral Q4H PRN [name], MD      . OLANZapine [facility]) tablet 10 mg  10 mg Oral BID Clapacs, [name], MD   10 mg at 09/21/20 0834  . propranolol (INDERAL) tablet 20 mg  20 mg Oral QHS [name], MD      . sertraline (ZOLOFT) tablet 100 mg  100 mg Oral Daily [name], MD   100 mg at 09/21/20 0831  . simvastatin (ZOCOR) tablet 40 mg  40 mg Oral q1800 Clapacs, [name],  MD   40 mg at 09/20/20 1728  . traZODone (DESYREL) tablet 100 mg  100 mg Oral QHS Clapacs, [name], MD   100 mg at 09/20/20 2134    Lab Results:  Results for orders placed or performed during the hospital encounter of 09/20/20 (from the past 48 hour(s))  Hemoglobin A1c     Status: None   Collection Time: 09/20/20  3:39 PM  Result Value Ref Range   Hgb A1c MFr Bld 5.1 4.8 - 5.6 %    Comment: (NOTE) Pre diabetes:          5.7%-6.4%  Diabetes:              >6.4%  Glycemic control for   <7.0% adults with diabetes    Mean Plasma Glucose 99.67 mg/dL    Comment: Performed at [facility], [address], [place], [place] [address]  Lipid panel     Status: Abnormal   Collection Time: 09/20/20  3:39 PM  Result Value Ref Range   Cholesterol 157 0 - 200 mg/dL   Triglycerides 892 (H) <150 mg/dL   HDL 32 (L) >11 mg/dL   Total CHOL/HDL Ratio 4.9 RATIO   VLDL UNABLE TO CALCULATE IF TRIGLYCERIDE OVER 400 mg/dL 0 - 40 mg/dL   LDL Cholesterol UNABLE TO CALCULATE IF TRIGLYCERIDE OVER 400 mg/dL 0 - 99 mg/dL    Comment:        Total Cholesterol/HDL:CHD Risk Coronary Heart Disease Risk Table                     Men   Women  1/2 Average Risk   3.4   3.3  Average Risk       5.0   4.4  2 X Average Risk   9.6   7.1  3 X Average Risk  23.4   11.0        Use the calculated Patient Ratio above and the CHD Risk Table to determine the patient's CHD Risk.        ATP III CLASSIFICATION (LDL):  <100     mg/dL   Optimal  941-740  mg/dL   Near or Above                    Optimal  130-159  mg/dL   Borderline  814-481  mg/dL   High  >856     mg/dL   Very High Performed at [facility], [address] [place], [place] [address]   LDL cholesterol, direct     Status: None   Collection Time: 09/20/20  3:39 PM  Result Value Ref Range   Direct LDL 68.6 0 - 99 mg/dL    Comment: Performed at [facility], [address], [place], [place] [address]  Glucose, capillary     Status:  Abnormal   Collection Time: 09/20/20  4:09 PM  Result Value Ref Range   Glucose-Capillary 166 (H) 70 - 99 mg/dL    Comment: Glucose reference range applies only to samples taken after fasting for at least 8 hours.  Glucose, capillary     Status: Abnormal   Collection Time: 09/21/20  6:52 AM  Result Value Ref Range   Glucose-Capillary 132 (H) 70 - 99 mg/dL    Comment: Glucose reference range applies only to samples taken after fasting for at least 8 hours.    Blood Alcohol level:  Lab Results  Component Value Date   [facility] <10 09/19/2020   ETH <10 07/14/2020    Metabolic Disorder Labs: Lab Results  Component Value Date   HGBA1C 5.1 09/20/2020   MPG 99.67 09/20/2020   MPG 105.41 07/16/2020   Lab Results  Component Value Date   PROLACTIN 2.0 (L) 08/31/2016   PROLACTIN 4.8 05/22/2016   Lab Results  Component Value Date   CHOL 157 09/20/2020   TRIG 447 (  H) 09/20/2020   HDL 32 (L) 09/20/2020   CHOLHDL 4.9 09/20/2020   VLDL UNABLE TO CALCULATE IF TRIGLYCERIDE OVER 400 mg/dL 30/16/0109   LDLCALC UNABLE TO CALCULATE IF TRIGLYCERIDE OVER 400 mg/dL 32/35/5732   LDLCALC 53 07/16/2020    Physical Findings: AIMS:  , ,  ,  ,    CIWA:    COWS:     Musculoskeletal: Strength & Muscle Tone: within normal limits Gait & Station: normal Patient leans: N/A  Psychiatric Specialty Exam:  Presentation  General Appearance: Casual  Eye Contact:Fleeting  Speech:Clear and Coherent  Speech Volume:Normal  Handedness:Right   Mood and Affect  Mood:Depressed; Irritable  Affect:Congruent   Thought Process  Thought Processes:Goal Directed  Descriptions of Associations:Intact  Orientation:Full (Time, Place and Person)  Thought Content:Perseveration  History of Schizophrenia/Schizoaffective disorder:Yes  Duration of Psychotic Symptoms:Greater than six months  Hallucinations:Hallucinations: None  Ideas of Reference:Paranoia  Suicidal Thoughts:Suicidal Thoughts: Yes,  Active SI Active Intent and/or Plan: Without Access to Means  Homicidal Thoughts:Homicidal Thoughts: No   Sensorium  Memory:Immediate Fair; Remote Fair; Recent Fair  Judgment:Poor  Insight:Shallow   Executive Functions  Concentration:Fair  Attention Span:Fair  Recall:Fair  Fund of Knowledge:Fair  Language:Fair   Psychomotor Activity  Psychomotor Activity:Psychomotor Activity: Restlessness   Assets  Assets:Communication Skills; Desire for Improvement; Financial Resources/Insurance; Housing; Resilience   Sleep  Sleep:Sleep: Fair    Physical Exam: Physical Exam ROS Blood pressure (!) 154/119, pulse 60, temperature 98.4 F (36.9 C), temperature source Oral, resp. rate 17, height 5\' 10"  (1.778 m), weight 82.6 kg, SpO2 98 %. Body mass index is 26.11 kg/m.   Treatment Plan Summary: Daily contact with patient to assess and evaluate symptoms and progress in treatment and Medication management 1) Schizoaffective Disorder, depressive type- established problem, unstable, current suicidal thoughts - Continue Zoloft 100 mg daily, continue Zyprexa 10 mg BID, Clozapine 500 mg QHS, Lithium 450 mg BID (level 1.12- not trough) trazodone 100 mg QHS. Increase Propranolol 20 mg QHS for anxiety   2) Diabetes Melltius Type 2 - Continue Metformin 1000 mg BID, sliding scale insulin, diabetic coordinator consult  3) HTN- established problem, stable - Continue lisinipril 2.5 mg daily  4) Long-term anticoagulation- continue apixiban 5 mg BID  5) Hyperlipidemia- continue Simvastin 40 mg daily at 6PM.  , MD 09/21/2020, 10:16 AM

## 2020-09-22 DIAGNOSIS — F251 Schizoaffective disorder, depressive type: Secondary | ICD-10-CM | POA: Diagnosis not present

## 2020-09-22 LAB — GLUCOSE, CAPILLARY
Glucose-Capillary: 129 mg/dL — ABNORMAL HIGH (ref 70–99)
Glucose-Capillary: 175 mg/dL — ABNORMAL HIGH (ref 70–99)
Glucose-Capillary: 97 mg/dL (ref 70–99)
Glucose-Capillary: 112 mg/dL — ABNORMAL HIGH (ref 70–99)

## 2020-09-22 MED ORDER — PROPRANOLOL HCL 20 MG PO TABS
30.0000 mg | ORAL_TABLET | Freq: Every day | ORAL | Status: DC
  Administered 2020-09-22 – 2020-09-25 (×4): 30 mg via ORAL
  Filled 2020-09-22 (×4): qty 2

## 2020-09-22 NOTE — Progress Notes (Signed)
This writer assumed care of patient at 0300. Per report, patient has been pleasant since the beginning of this shift. He received medication for sleep, and has been sleeping since administration. Patient is resting quietly, in bed, in no acute signs of distress. Patient will continue to be monitored, and remains safe on the unit at this time.

## 2020-09-22 NOTE — Progress Notes (Addendum)
[facility]  09/22/2020 1:53 PM Lance [name]  MRN:  [id number]   CC "I'm still anxious at night"  Subjective:  45 year old male presenting with worsening anxiety, depression, and suicidal ideations with plan to cut himself. This morning patient seen one-on-one. He reports partial relief of nighttime anxiety and panic attacks with propranolol, but requests further increase. He continues to endorse suicidal ideations, and thoughts of hurting others at his group home. He remains fixated on trying to find a new place to live. He denies any auditory hallucinations or visual hallucinations.     Principal Problem: Schizoaffective disorder, depressive type (HCC) Diagnosis: Principal Problem:   Schizoaffective disorder, depressive type (HCC) Active Problems:   Hypertension   Diabetes mellitus without complication (HCC)   Hyperlipidemia   Chronic anticoagulation   Tobacco use disorder  Total Time spent with patient: 30 minutes  Past Psychiatric History: See H&P  Past Medical History:  Past Medical History:  Diagnosis Date  . Depression   . Diabetes mellitus without complication (HCC)   . Hyperlipidemia   . Hypertension   . Lupus anticoagulant disorder (HCC) 06/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  . PE (pulmonary thromboembolism) (HCC)   . Schizo affective schizophrenia (HCC)   . Supratherapeutic INR 11/19/2016   History reviewed. No pertinent surgical history. Family History:  Family History  Problem Relation Age of Onset  . CAD Mother   . CAD Sister    Family Psychiatric  History: See H&P Social History:  Social History   Substance and Sexual Activity  Alcohol Use No   Comment: occassionally     Social History   Substance and Sexual Activity  Drug Use Not Currently  . Types: Marijuana   Comment: Last use 11/29/16    Social History   Socioeconomic History  . Marital status: Single    Spouse name: Not on file  . Number of children: Not on file   . Years of education: Not on file  . Highest education level: Not on file  Occupational History  . Not on file  Tobacco Use  . Smoking status: Current Every Day Smoker    Packs/day: 1.00    Years: 23.00    Pack years: 23.00    Types: Cigarettes  . Smokeless tobacco: Never Used  . Tobacco comment: will provide material  Substance and Sexual Activity  . Alcohol use: No    Comment: occassionally  . Drug use: Not Currently    Types: Marijuana    Comment: Last use 11/29/16  . Sexual activity: Never    Birth control/protection: None    Comment: occasional marijuana- none recently  Other Topics Concern  . Not on file  Social History Narrative   [place]   Social Determinants of Health   Financial Resource Strain: Not on file  Food Insecurity: Not on file  Transportation Needs: Not on file  Physical Activity: Not on file  Stress: Not on file  Social Connections: Not on file   Additional Social History:                         Sleep: Fair  Appetite:  Good  Current Medications: Current Facility-Administered Medications  Medication Dose Route Frequency Provider Last Rate Last Admin  . acetaminophen (TYLENOL) tablet 650 mg  650 mg Oral Q6H PRN Clapacs, [name], MD      . albuterol (VENTOLIN HFA) 108 (90  Base) MCG/ACT inhaler 1-2 puff  1-2 puff Inhalation Q6H PRN Clapacs, [name], MD      . alum & mag hydroxide-simeth (MAALOX/MYLANTA) 200-200-20 MG/5ML suspension 30 mL  30 mL Oral Q4H PRN Clapacs, [name], MD      . apixaban (ELIQUIS) tablet 5 mg  5 mg Oral BID Clapacs, [name], MD   5 mg at 09/22/20 0758  . cloZAPine (CLOZARIL) tablet 500 mg  500 mg Oral QHS Clapacs, [name], MD   500 mg at 09/21/20 2056  . fluticasone furoate-vilanterol (BREO ELLIPTA) 200-25 MCG/INH 1 puff  1 puff Inhalation Daily Clapacs, [name], MD   1 puff at 09/22/20 0801  . hydrOXYzine (ATARAX/VISTARIL) tablet 50 mg  50 mg Oral TID PRN Clapacs, [name], MD   50 mg at 09/21/20  0837  . insulin aspart (novoLOG) injection 0-9 Units  0-9 Units Subcutaneous TID WC Clapacs, [name], MD   2 Units at 09/22/20 1122  . insulin detemir (LEVEMIR) injection 6 Units  6 Units Subcutaneous BID [name], MD   6 Units at 09/22/20 1121  . lisinopril (ZESTRIL) tablet 2.5 mg  2.5 mg Oral Daily Clapacs, [name], MD   2.5 mg at 09/22/20 0757  . lithium carbonate (ESKALITH) CR tablet 450 mg  450 mg Oral Q12H Clapacs, [name], MD   450 mg at 09/22/20 0758  . magnesium hydroxide (MILK OF MAGNESIA) suspension 30 mL  30 mL Oral Daily PRN Clapacs, [name], MD      . metFORMIN (GLUCOPHAGE) tablet 1,000 mg  1,000 mg Oral BID WC Clapacs, [name], MD   1,000 mg at 09/22/20 0758  . nicotine polacrilex (NICORETTE) gum 2 mg  2 mg Oral Q4H PRN [name], MD   2 mg at 09/22/20 1307  . OLANZapine (ZYPREXA) tablet 10 mg  10 mg Oral BID Clapacs, [name], MD   10 mg at 09/22/20 0757  . ondansetron (ZOFRAN) tablet 8 mg  8 mg Oral Q8H PRN [name], MD      . propranolol (INDERAL) tablet 30 mg  30 mg Oral QHS [name], MD      . sertraline (ZOLOFT) tablet 100 mg  100 mg Oral Daily [name], MD   100 mg at 09/22/20 0758  . simvastatin (ZOCOR) tablet 40 mg  40 mg Oral q1800 Clapacs, [name], MD   40 mg at 09/21/20 2055  . traZODone (DESYREL) tablet 100 mg  100 mg Oral QHS Clapacs, [name], MD   100 mg at 09/21/20 2056    Lab Results:  Results for orders placed or performed during the hospital encounter of 09/20/20 (from the past 48 hour(s))  Hemoglobin A1c     Status: None   Collection Time: 09/20/20  3:39 PM  Result Value Ref Range   Hgb A1c MFr Bld 5.1 4.8 - 5.6 %    Comment: (NOTE) Pre diabetes:          5.7%-6.4%  Diabetes:              >6.4%  Glycemic control for   <7.0% adults with diabetes    Mean Plasma Glucose 99.67 mg/dL    Comment: Performed at [facility], [address] [place], [place] [address]  Lipid panel     Status: Abnormal   Collection Time:  09/20/20  3:39 PM  Result Value Ref Range   Cholesterol 157 0 - 200 mg/dL  Triglycerides 447 (H) <150 mg/dL   HDL 32 (L) >35 mg/dL   Total CHOL/HDL Ratio 4.9 RATIO   VLDL UNABLE TO CALCULATE IF TRIGLYCERIDE OVER 400 mg/dL 0 - 40 mg/dL   LDL Cholesterol UNABLE TO CALCULATE IF TRIGLYCERIDE OVER 400 mg/dL 0 - 99 mg/dL    Comment:        Total Cholesterol/HDL:CHD Risk Coronary Heart Disease Risk Table                     Men   Women  1/2 Average Risk   3.4   3.3  Average Risk       5.0   4.4  2 X Average Risk   9.6   7.1  3 X Average Risk  23.4   11.0        Use the calculated Patient Ratio above and the CHD Risk Table to determine the patient's CHD Risk.        ATP III CLASSIFICATION (LDL):  <100     mg/dL   Optimal  573-220  mg/dL   Near or Above                    Optimal  130-159  mg/dL   Borderline  254-270  mg/dL   High  >623     mg/dL   Very High Performed at [facility], [address] [place], [place] [address]   LDL cholesterol, direct     Status: None   Collection Time: 09/20/20  3:39 PM  Result Value Ref Range   Direct LDL 68.6 0 - 99 mg/dL    Comment: Performed at [facility], [address], [place], [place] [address]  Glucose, capillary     Status: Abnormal   Collection Time: 09/20/20  4:09 PM  Result Value Ref Range   Glucose-Capillary 166 (H) 70 - 99 mg/dL    Comment: Glucose reference range applies only to samples taken after fasting for at least 8 hours.  Glucose, capillary     Status: Abnormal   Collection Time: 09/21/20  6:52 AM  Result Value Ref Range   Glucose-Capillary 132 (H) 70 - 99 mg/dL    Comment: Glucose reference range applies only to samples taken after fasting for at least 8 hours.  Glucose, capillary     Status: Abnormal   Collection Time: 09/21/20 11:29 AM  Result Value Ref Range   Glucose-Capillary 61 (L) 70 - 99 mg/dL    Comment: Glucose reference range applies only to samples taken after fasting for at least 8  hours.  Glucose, capillary     Status: None   Collection Time: 09/21/20  4:03 PM  Result Value Ref Range   Glucose-Capillary 90 70 - 99 mg/dL    Comment: Glucose reference range applies only to samples taken after fasting for at least 8 hours.  Glucose, capillary     Status: None   Collection Time: 09/21/20  8:10 PM  Result Value Ref Range   Glucose-Capillary 95 70 - 99 mg/dL    Comment: Glucose reference range applies only to samples taken after fasting for at least 8 hours.  Glucose, capillary     Status: Abnormal   Collection Time: 09/22/20  7:05 AM  Result Value Ref Range   Glucose-Capillary 129 (H) 70 - 99 mg/dL    Comment: Glucose reference range applies only to samples taken after fasting for at least 8 hours.  Glucose, capillary  Status: Abnormal   Collection Time: 09/22/20 11:20 AM  Result Value Ref Range   Glucose-Capillary 175 (H) 70 - 99 mg/dL    Comment: Glucose reference range applies only to samples taken after fasting for at least 8 hours.    Blood Alcohol level:  Lab Results  Component Value Date   ETH <10 09/19/2020   ETH <10 07/14/2020    Metabolic Disorder Labs: Lab Results  Component Value Date   HGBA1C 5.1 09/20/2020   MPG 99.67 09/20/2020   MPG 105.41 07/16/2020   Lab Results  Component Value Date   PROLACTIN 2.0 (L) 08/31/2016   PROLACTIN 4.8 05/22/2016   Lab Results  Component Value Date   CHOL 157 09/20/2020   TRIG 447 (H) 09/20/2020   HDL 32 (L) 09/20/2020   CHOLHDL 4.9 09/20/2020   VLDL UNABLE TO CALCULATE IF TRIGLYCERIDE OVER 400 mg/dL 23/30/0762   LDLCALC UNABLE TO CALCULATE IF TRIGLYCERIDE OVER 400 mg/dL 26/33/3545   LDLCALC 53 07/16/2020    Physical Findings: AIMS:  , ,  ,  ,    CIWA:    COWS:     Musculoskeletal: Strength & Muscle Tone: within normal limits Gait & Station: normal Patient leans: N/A  Psychiatric Specialty Exam:  Presentation  General Appearance: Casual  Eye Contact:Fleeting  Speech:Clear and  Coherent  Speech Volume:Normal  Handedness:Right   Mood and Affect  Mood:Depressed; Irritable  Affect:Congruent   Thought Process  Thought Processes:Goal Directed  Descriptions of Associations:Intact  Orientation:Full (Time, Place and Person)  Thought Content:Perseveration  History of Schizophrenia/Schizoaffective disorder:Yes  Duration of Psychotic Symptoms:Greater than six months  Hallucinations:Hallucinations: None  Ideas of Reference:Paranoia  Suicidal Thoughts:Suicidal Thoughts: Yes, Active SI Active Intent and/or Plan: Without Access to Means  Homicidal Thoughts: States he wishes to harm group home members  Sensorium  Memory:Immediate Fair; Remote Fair; Recent Fair  Judgment:Poor  Insight:Shallow   Executive Functions  Concentration:Fair  Attention Span:Fair  Recall:Fair  Fund of Knowledge:Fair  Language:Fair   Psychomotor Activity  Psychomotor Activity:Psychomotor Activity: Restlessness   Assets  Assets:Communication Skills; Desire for Improvement; Financial Resources/Insurance; Housing; Resilience   Sleep  Sleep:Sleep: Fair    Physical Exam: Physical Exam  ROS  Blood pressure 99/82, pulse 90, temperature 97.7 F (36.5 C), temperature source Oral, resp. rate 18, height 5\' 10"  (1.778 m), weight 82.6 kg, SpO2 98 %. Body mass index is 26.11 kg/m.   Treatment Plan Summary: Daily contact with patient to assess and evaluate symptoms and progress in treatment and Medication management 1) Schizoaffective Disorder, depressive type- established problem, unstable, current suicidal thoughts - Continue Zoloft 100 mg daily, continue Zyprexa 10 mg BID, Clozapine 500 mg QHS, Lithium 450 mg BID (level 1.12- not trough) trazodone 100 mg QHS. Increase Propranolol 30 mg QHS for anxiety   2) Diabetes Melltius Type 2 - Continue Metformin 1000 mg BID, sliding scale insulin, levimer 6 mg BID, diabetic coordinator consult  3) HTN- established  problem, stable - Continue lisinipril 2.5 mg daily  4) Long-term anticoagulation- continue apixiban 5 mg BID  5) Hyperlipidemia- continue Simvastin 40 mg daily at 6PM.  09/22/20: Psychiatric exam above reviewed and remains accurate. Assessment and plan above reviewed and updated.    11/22/20, MD 09/22/2020, 1:53 PM

## 2020-09-22 NOTE — Plan of Care (Signed)
  Problem: Education: Goal: Emotional status will improve Outcome: Progressing   Problem: Education: Goal: Mental status will improve Outcome: Progressing   Problem: Education: Goal: Verbalization of understanding the information provided will improve Outcome: Progressing   Problem: Coping: Goal: Coping ability will improve Outcome: Progressing   Problem: Self-Concept: Goal: Level of anxiety will decrease Outcome: Progressing

## 2020-09-22 NOTE — Progress Notes (Signed)
Patient pleasant and cooperative. Has been active on the unit walking the halls and hanging out in the milieu.  He was med compliant this evening.  He denies SI  HI AVH and pain at this encounter. He explains that the longer he is away from the group home the better he gets. Reports that his main issue is no longer wanting to be there and how he does not like anyone there. He does continue to endorse depression an anxiety/  Was hyper focused on getting anxiety medication and wanted to know if he was going to be on the same medication once he was discharged, because he will need anxiety medication to help him get to sleep each night. He was med compliant and tolerated his medication without incident.  He remains safe on the unit with 15 minute safety checks and was encourage to come to staff with amy concerns.     Cleo Butler-Nicholson, LPN

## 2020-09-22 NOTE — Progress Notes (Signed)
Patient is cooperative with assessment but says he is not feeling well. Patient does not reply when asked to elaborate further. Patient endorses passive SI with no plan. He denies HI and AVH. Patient has been pacing the hallways but spends most of the time isolating to his room.  Patient remains safe on the unit at this time and q15 min safety checks are maintained.

## 2020-09-23 DIAGNOSIS — F251 Schizoaffective disorder, depressive type: Secondary | ICD-10-CM | POA: Diagnosis not present

## 2020-09-23 LAB — GLUCOSE, CAPILLARY
Glucose-Capillary: 112 mg/dL — ABNORMAL HIGH (ref 70–99)
Glucose-Capillary: 87 mg/dL (ref 70–99)
Glucose-Capillary: 101 mg/dL — ABNORMAL HIGH (ref 70–99)
Glucose-Capillary: 141 mg/dL — ABNORMAL HIGH (ref 70–99)

## 2020-09-23 NOTE — [facility] Group Notes (Signed)
LCSW Group Therapy Note   09/23/2020 1:54 PM  Type of Therapy and Topic:  Group Therapy:  Overcoming Obstacles   Participation Level:  Active   Description of Group:    In this group patients will be encouraged to explore what they see as obstacles to their own wellness and recovery. They will be guided to discuss their thoughts, feelings, and behaviors related to these obstacles. The group will process together ways to cope with barriers, with attention given to specific choices patients can make. Each patient will be challenged to identify changes they are motivated to make in order to overcome their obstacles. This group will be process-oriented, with patients participating in exploration of their own experiences as well as giving and receiving support and challenge from other group members.   Therapeutic Goals: 1. Patient will identify personal and current obstacles as they relate to admission. 2. Patient will identify barriers that currently interfere with their wellness or overcoming obstacles.  3. Patient will identify feelings, thought process and behaviors related to these barriers. 4. Patient will identify two changes they are willing to make to overcome these obstacles:      Summary of Patient Progress Patient was present for the majority of the group. He identified anxiety as an obstacle for him. His focus remained on medication for management of symptoms most of the time but was redirectable. Pt talked about being involved with his outpatient provider, social groups, and listening to music as a positive coping skill for him. Pt's comments at times were not pertinent to the discussion but he was easily redirected.    Therapeutic Modalities:   Cognitive Behavioral Therapy Solution Focused Therapy Motivational Interviewing Relapse Prevention Therapy  Simona Huh R. Algis Greenhouse, MSW, LCSW, [name] 09/23/2020 1:54 PM

## 2020-09-23 NOTE — Progress Notes (Signed)
Recreation Therapy Notes    Date: 09/23/2020  Time: 10:00 am   Location: Craft room    Behavioral response: N/A   Intervention Topic: Self-care   Discussion/Intervention: Patient did not attend group.   Clinical Observations/Feedback:  Patient did not attend group.   Bennett Vanscyoc LRT/CTRS        Sarahelizabeth Conway 09/23/2020 12:06 PM

## 2020-09-23 NOTE — Progress Notes (Signed)
Recreation Therapy Notes  INPATIENT RECREATION TR PLAN  Patient Details Name: Lance [name] MRN: [id number] DOB: 1975-10-07 Today's Date: 09/23/2020  Rec Therapy Plan Is patient appropriate for Therapeutic Recreation?: Yes Treatment times per week: at least 3 Estimated Length of Stay: 5-7 days TR Treatment/Interventions: Group participation (Comment)  Discharge Criteria Pt will be discharged from therapy if:: Discharged Treatment plan/goals/alternatives discussed and agreed upon by:: Patient/family  Discharge Summary     [name] 09/23/2020, 3:47 PM

## 2020-09-23 NOTE — [facility] Suicide Risk Assessment (Signed)
[facility] INPATIENT:  Family/Significant Other Suicide Prevention Education  Suicide Prevention Education:  Education Completed; Maureen Ralphs Harris/ [phone]  (name of family member/significant other) has been identified by the patient as the family member/significant other with whom the patient will be residing, and identified as the person(s) who will aid the patient in the event of a mental health crisis (suicidal ideations/suicide attempt).  With written consent from the patient, the family member/significant other has been provided the following suicide prevention education, prior to the and/or following the discharge of the patient.  The suicide prevention education provided includes the following:  Suicide risk factors  Suicide prevention and interventions  National Suicide Hotline telephone number  [facility] assessment telephone number  [facility] Emergency Assistance 911  [facility] and/or Residential Mobile Crisis Unit telephone number  Request made of family/significant other to:  Remove weapons (e.g., guns, rifles, knives), all items previously/currently identified as safety concern.    Remove drugs/medications (over-the-counter, prescriptions, illicit drugs), all items previously/currently identified as a safety concern.  The family member/significant other verbalizes understanding of the suicide prevention education information provided.  The family member/significant other agrees to remove the items of safety concern listed above.  Sandeep Radell A [place] 09/23/2020, 1:39 PM

## 2020-09-23 NOTE — Tx Team (Signed)
Interdisciplinary Treatment and Diagnostic Plan Update  09/23/2020 Time of Session: 9:00AM Lance [name] MRN: [id number]  Principal Diagnosis: Schizoaffective disorder, depressive type [facility])  Secondary Diagnoses: Principal Problem:   Schizoaffective disorder, depressive type ([place]) Active Problems:   Hypertension   Diabetes mellitus without complication ([place])   Hyperlipidemia   Chronic anticoagulation   Tobacco use disorder   Current Medications:  Current Facility-Administered Medications  Medication Dose Route Frequency Provider Last Rate Last Admin  . acetaminophen (TYLENOL) tablet 650 mg  650 mg Oral Q6H PRN Clapacs, [name], MD      . albuterol (VENTOLIN HFA) 108 (90 Base) MCG/ACT inhaler 1-2 puff  1-2 puff Inhalation Q6H PRN Clapacs, [name], MD      . alum & mag hydroxide-simeth (MAALOX/MYLANTA) 200-200-20 MG/5ML suspension 30 mL  30 mL Oral Q4H PRN Clapacs, [name], MD      . apixaban (ELIQUIS) tablet 5 mg  5 mg Oral BID Clapacs, Madie Reno, MD   5 mg at 09/23/20 0751  . cloZAPine (CLOZARIL) tablet 500 mg  500 mg Oral QHS Clapacs, Madie Reno, MD   500 mg at 09/22/20 2105  . fluticasone furoate-vilanterol (BREO ELLIPTA) 200-25 MCG/INH 1 puff  1 puff Inhalation Daily Clapacs, Madie Reno, MD   1 puff at 09/23/20 0755  . hydrOXYzine (ATARAX/VISTARIL) tablet 50 mg  50 mg Oral TID PRN Clapacs, Madie Reno, MD   50 mg at 09/22/20 2105  . insulin aspart (novoLOG) injection 0-9 Units  0-9 Units Subcutaneous TID WC Clapacs, Madie Reno, MD   2 Units at 09/22/20 1122  . insulin detemir (LEVEMIR) injection 6 Units  6 Units Subcutaneous BID Salley Scarlet, MD   6 Units at 09/22/20 2110  . lisinopril (ZESTRIL) tablet 2.5 mg  2.5 mg Oral Daily Clapacs, [name], MD   2.5 mg at 09/23/20 0750  . lithium carbonate (ESKALITH) CR tablet 450 mg  450 mg Oral Q12H Clapacs, Madie Reno, MD   450 mg at 09/23/20 0749  . magnesium hydroxide (MILK OF MAGNESIA) suspension 30 mL  30 mL Oral Daily PRN Clapacs, [name], MD      .  metFORMIN (GLUCOPHAGE) tablet 1,000 mg  1,000 mg Oral BID WC Clapacs, Madie Reno, MD   1,000 mg at 09/23/20 0752  . nicotine polacrilex (NICORETTE) gum 2 mg  2 mg Oral Q4H PRN Salley Scarlet, MD   2 mg at 09/22/20 1756  . OLANZapine (ZYPREXA) tablet 10 mg  10 mg Oral BID Clapacs, Madie Reno, MD   10 mg at 09/23/20 0755  . ondansetron (ZOFRAN) tablet 8 mg  8 mg Oral Q8H PRN Salley Scarlet, MD      . propranolol (INDERAL) tablet 30 mg  30 mg Oral QHS Salley Scarlet, MD   30 mg at 09/22/20 2105  . sertraline (ZOLOFT) tablet 100 mg  100 mg Oral Daily Salley Scarlet, MD   100 mg at 09/23/20 0750  . simvastatin (ZOCOR) tablet 40 mg  40 mg Oral q1800 Clapacs, Madie Reno, MD   40 mg at 09/22/20 1728  . traZODone (DESYREL) tablet 100 mg  100 mg Oral QHS Clapacs, Madie Reno, MD   100 mg at 09/22/20 2104   PTA Medications: Medications Prior to Admission  Medication Sig Dispense Refill Last Dose  . albuterol (VENTOLIN HFA) 108 (90 Base) MCG/ACT inhaler Inhale 1-2 puffs into the lungs every 6 (six) hours as needed for wheezing or shortness of breath. 18 g  1 09/21/2020  . apixaban (ELIQUIS) 5 MG TABS tablet Take 1 tablet (5 mg total) by mouth 2 (two) times daily. 60 tablet 6 09/21/2020  . cloZAPine (CLOZARIL) 100 MG tablet Take 5 tablets (500 mg total) by mouth at bedtime. 150 tablet 1 09/21/2020  . glucose blood (FORA V30A BLOOD GLUCOSE TEST) test strip Use as instructed 100 each 12 09/21/2020  . LEVEMIR FLEXTOUCH 100 UNIT/ML FlexPen Inject 10 Units into the skin 2 (two) times daily.   09/21/2020  . lisinopril (ZESTRIL) 2.5 MG tablet Take 1 tablet (2.5 mg total) by mouth daily. 30 tablet 1 09/21/2020  . lithium carbonate (ESKALITH) 450 MG CR tablet Take 1 tablet (450 mg total) by mouth every 12 (twelve) hours. 60 tablet 1 09/21/2020  . metFORMIN (GLUCOPHAGE) 1000 MG tablet Take 1 tablet (1,000 mg total) by mouth 2 (two) times daily with a meal. 60 tablet 1 09/21/2020  . NOVOFINE AUTOCOVER PEN NEEDLE 30G X 8 MM MISC    09/21/2020  .  OLANZapine (ZYPREXA) 20 MG tablet Take 20 mg by mouth at bedtime.   09/21/2020  . Omega-3 Fatty Acids (FISH OIL) 1000 MG CAPS Take 1 capsule by mouth daily.   09/21/2020  . sertraline (ZOLOFT) 50 MG tablet Take 1 tablet (50 mg total) by mouth daily. 30 tablet 1 09/21/2020  . simvastatin (ZOCOR) 40 MG tablet Take 1 tablet (40 mg total) by mouth daily at 6 PM. 30 tablet 6 09/21/2020  . traZODone (DESYREL) 100 MG tablet Take 1 tablet (100 mg total) by mouth at bedtime. 30 tablet 1 09/21/2020    Patient Stressors:    Patient Strengths:    Treatment Modalities: Medication Management, Group therapy, Case management,  1 to 1 session with clinician, Psychoeducation, Recreational therapy.   Physician Treatment Plan for Primary Diagnosis: Schizoaffective disorder, depressive type ([place]) Long Term Goal(s): Improvement in symptoms so as ready for discharge Improvement in symptoms so as ready for discharge   Short Term Goals: Ability to identify changes in lifestyle to reduce recurrence of condition will improve Ability to verbalize feelings will improve Ability to disclose and discuss suicidal ideas Ability to demonstrate self-control will improve Ability to identify and develop effective coping behaviors will improve Ability to maintain clinical measurements within normal limits will improve Compliance with prescribed medications will improve Ability to identify triggers associated with substance abuse/mental health issues will improve Ability to identify changes in lifestyle to reduce recurrence of condition will improve Ability to verbalize feelings will improve Ability to disclose and discuss suicidal ideas Ability to demonstrate self-control will improve Ability to identify and develop effective coping behaviors will improve Ability to maintain clinical measurements within normal limits will improve Compliance with prescribed medications will improve Ability to identify triggers associated with  substance abuse/mental health issues will improve  Medication Management: Evaluate patient's response, side effects, and tolerance of medication regimen.  Therapeutic Interventions: 1 to 1 sessions, Unit Group sessions and Medication administration.  Evaluation of Outcomes: Not Met  Physician Treatment Plan for Secondary Diagnosis: Principal Problem:   Schizoaffective disorder, depressive type ([place]) Active Problems:   Hypertension   Diabetes mellitus without complication (HCC)   Hyperlipidemia   Chronic anticoagulation   Tobacco use disorder  Long Term Goal(s): Improvement in symptoms so as ready for discharge Improvement in symptoms so as ready for discharge   Short Term Goals: Ability to identify changes in lifestyle to reduce recurrence of condition will improve Ability to verbalize feelings will improve Ability to disclose  and discuss suicidal ideas Ability to demonstrate self-control will improve Ability to identify and develop effective coping behaviors will improve Ability to maintain clinical measurements within normal limits will improve Compliance with prescribed medications will improve Ability to identify triggers associated with substance abuse/mental health issues will improve Ability to identify changes in lifestyle to reduce recurrence of condition will improve Ability to verbalize feelings will improve Ability to disclose and discuss suicidal ideas Ability to demonstrate self-control will improve Ability to identify and develop effective coping behaviors will improve Ability to maintain clinical measurements within normal limits will improve Compliance with prescribed medications will improve Ability to identify triggers associated with substance abuse/mental health issues will improve     Medication Management: Evaluate patient's response, side effects, and tolerance of medication regimen.  Therapeutic Interventions: 1 to 1 sessions, Unit Group sessions and  Medication administration.  Evaluation of Outcomes: Not Met   RN Treatment Plan for Primary Diagnosis: Schizoaffective disorder, depressive type ([place]) Long Term Goal(s): Knowledge of disease and therapeutic regimen to maintain health will improve  Short Term Goals: Ability to remain free from injury will improve, Ability to verbalize frustration and anger appropriately will improve, Ability to demonstrate self-control, Ability to participate in decision making will improve, Ability to identify and develop effective coping behaviors will improve and Compliance with prescribed medications will improve  Medication Management: RN will administer medications as ordered by provider, will assess and evaluate patient's response and provide education to patient for prescribed medication. RN will report any adverse and/or side effects to prescribing provider.  Therapeutic Interventions: 1 on 1 counseling sessions, Psychoeducation, Medication administration, Evaluate responses to treatment, Monitor vital signs and CBGs as ordered, Perform/monitor CIWA, COWS, AIMS and Fall Risk screenings as ordered, Perform wound care treatments as ordered.  Evaluation of Outcomes: Not Met   LCSW Treatment Plan for Primary Diagnosis: Schizoaffective disorder, depressive type ([place]) Long Term Goal(s): Safe transition to appropriate next level of care at discharge, Engage patient in therapeutic group addressing interpersonal concerns.  Short Term Goals: Engage patient in aftercare planning with referrals and resources, Increase social support, Increase ability to appropriately verbalize feelings, Increase emotional regulation, Facilitate acceptance of mental health diagnosis and concerns, Identify triggers associated with mental health/substance abuse issues and Increase skills for wellness and recovery  Therapeutic Interventions: Assess for all discharge needs, 1 to 1 time with Social worker, Explore available resources and  support systems, Assess for adequacy in community support network, Educate family and significant other(s) on suicide prevention, Complete Psychosocial Assessment, Interpersonal group therapy.  Evaluation of Outcomes: Not Met   Progress in Treatment: Attending groups: No. Participating in groups: No. Taking medication as prescribed: Yes. Toleration medication: Yes. Family/Significant other contact made: Yes, individual(s) contacted:  [name], guardian and Vicente Males, niece.  Patient understands diagnosis: No. Discussing patient identified problems/goals with staff: Yes. Medical problems stabilized or resolved: Yes. Denies suicidal/homicidal ideation: Yes. Issues/concerns per patient self-inventory: No. Other: None.  New problem(s) identified: No, Describe:  none.  New Short Term/Long Term Goal(s): elimination of symptoms of psychosis, medication management for mood stabilization; elimination of SI thoughts; development of comprehensive mental wellness plan.  Patient Goals:  "Get anxiety under control and find a different place to live."  Discharge Plan or Barriers: Pt would like to go somewhere else upon discharge, however, he was reminded that this would have to be approved by the guardian. CSW to reach out to guardian and ACTT regarding discharge planning/disposition.  Reason for Continuation of Hospitalization: Anxiety Hallucinations  Medication stabilization Suicidal ideation  Estimated Length of Stay: 1-7 days  Attendees: Patient: Lance [name] 09/23/2020 8:56 AM  Physician: Selina Cooley, MD 09/23/2020 8:56 AM  Nursing: West Pugh, RN 09/23/2020 8:56 AM  RN Care Manager: 09/23/2020 8:56 AM  Social Worker: Chalmers Guest. Guerry Bruin, MSW, [place], [place] 09/23/2020 8:56 AM  Recreational Therapist: Devin Going, LRT  09/23/2020 8:56 AM  Other: Kiva Martinique, MSW, LCSW-A 09/23/2020 8:56 AM  Other: Paulla Dolly, MSW, [place], [place] 09/23/2020 8:56 AM  Other: 09/23/2020 8:56 AM    Scribe for  Treatment Team: Shirl Harris, LCSW 09/23/2020 8:56 AM

## 2020-09-23 NOTE — Progress Notes (Signed)
Progress Note:  Lance [name] appears sullen and sad. When asked questions he replies "I don't know." He said he is tired and anxious. When asked around dinner time about his anxiety patient stated that "it's better." Patient is medication compliant. Patient has a good appetite. Patient hands are shaking while sitting or standing still. Patient's blood sugar was 87 at 1100. Patient was given peanut butter crackers and orange juice. Patient's CBG went up to 101. Patient is seen walking around safely in the milieu. Patient denies SI, HI, and AVH. Will continue to monitor with 15 minute safety checks.

## 2020-09-23 NOTE — Progress Notes (Signed)
[facility] MD Progress Note  09/23/2020 12:30 PM Lance [name]  MRN:  [id number]   CC "Will I get the anxiety medications at discharge?"  Subjective:  45 year old male presenting with worsening anxiety, depression, and suicidal ideations with plan to cut himself. This morning patient seen one-on-one. He reports that the hydroxyzine and propranolol have been helping his anxiety at bedtime. He remains concerned that he will not be able to take this at discharge. He stopped me in the hall twice yesterday and three times today to verify he would be given this medication at discharge. He continues to report suicidal ideations, but contracts for safety in the hospital. He denies any homicidal ideations. Denies visual or auditory hallucinations. He remains fixated on needing to go to a new group home as he feels he will hurt others or himself if he returns.   Contacted his legal guardian, Lance [name], [phone]. She states she had found a new group home after last admission, but he ultimately declined and stated he wanted to remain at group home since his niece was moving to [place]. He has not contacted her to make her aware of desire to move again, and thus she has not been looking. However, she states she will call his ACT Team today to see if they had any potential placements in [place]. She notes that when psychiatrically stable he will most likely need to return to current group home until new placement is secured. She was updated on current plan of care and medications.   Principal Problem: Schizoaffective disorder, depressive type (HCC) Diagnosis: Principal Problem:   Schizoaffective disorder, depressive type (HCC) Active Problems:   Hypertension   Diabetes mellitus without complication (HCC)   Hyperlipidemia   Chronic anticoagulation   Tobacco use disorder  Total Time spent with patient: 30 minutes  Past Psychiatric History: See H&P  Past Medical History:  Past Medical History:   Diagnosis Date  . Depression   . Diabetes mellitus without complication (HCC)   . Hyperlipidemia   . Hypertension   . Lupus anticoagulant disorder (HCC) 06/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  . PE (pulmonary thromboembolism) (HCC)   . Schizo affective schizophrenia (HCC)   . Supratherapeutic INR 11/19/2016   History reviewed. No pertinent surgical history. Family History:  Family History  Problem Relation Age of Onset  . CAD Mother   . CAD Sister    Family Psychiatric  History: See H&P Social History:  Social History   Substance and Sexual Activity  Alcohol Use No   Comment: occassionally     Social History   Substance and Sexual Activity  Drug Use Not Currently  . Types: Marijuana   Comment: Last use 11/29/16    Social History   Socioeconomic History  . Marital status: Single    Spouse name: Not on file  . Number of children: Not on file  . Years of education: Not on file  . Highest education level: Not on file  Occupational History  . Not on file  Tobacco Use  . Smoking status: Current Every Day Smoker    Packs/day: 1.00    Years: 23.00    Pack years: 23.00    Types: Cigarettes  . Smokeless tobacco: Never Used  . Tobacco comment: will provide material  Substance and Sexual Activity  . Alcohol use: No    Comment: occassionally  . Drug use: Not Currently    Types: Marijuana    Comment: Last use 11/29/16  .  Sexual activity: Never    Birth control/protection: None    Comment: occasional marijuana- none recently  Other Topics Concern  . Not on file  Social History Narrative   From a group home in [place]   Social Determinants of Health   Financial Resource Strain: Not on file  Food Insecurity: Not on file  Transportation Needs: Not on file  Physical Activity: Not on file  Stress: Not on file  Social Connections: Not on file   Additional Social History:                         Sleep: Fair  Appetite:  Good  Current  Medications: Current Facility-Administered Medications  Medication Dose Route Frequency Provider Last Rate Last Admin  . acetaminophen (TYLENOL) tablet 650 mg  650 mg Oral Q6H PRN Clapacs, [name], MD      . albuterol (VENTOLIN HFA) 108 (90 Base) MCG/ACT inhaler 1-2 puff  1-2 puff Inhalation Q6H PRN Clapacs, [name], MD      . alum & mag hydroxide-simeth (MAALOX/MYLANTA) 200-200-20 MG/5ML suspension 30 mL  30 mL Oral Q4H PRN Clapacs, [name], MD      . apixaban (ELIQUIS) tablet 5 mg  5 mg Oral BID Clapacs, [name], MD   5 mg at 09/23/20 0751  . cloZAPine (CLOZARIL) tablet 500 mg  500 mg Oral QHS Clapacs, [name], MD   500 mg at 09/22/20 2105  . fluticasone furoate-vilanterol (BREO ELLIPTA) 200-25 MCG/INH 1 puff  1 puff Inhalation Daily Clapacs, [name], MD   1 puff at 09/23/20 0755  . hydrOXYzine (ATARAX/VISTARIL) tablet 50 mg  50 mg Oral TID PRN Clapacs, [name], MD   50 mg at 09/22/20 2105  . insulin aspart (novoLOG) injection 0-9 Units  0-9 Units Subcutaneous TID WC Clapacs, [name], MD   2 Units at 09/22/20 1122  . insulin detemir (LEVEMIR) injection 6 Units  6 Units Subcutaneous BID [name], MD   6 Units at 09/23/20 [phone]  . lisinopril (ZESTRIL) tablet 2.5 mg  2.5 mg Oral Daily Clapacs, [name], MD   2.5 mg at 09/23/20 0750  . lithium carbonate (ESKALITH) CR tablet 450 mg  450 mg Oral Q12H Clapacs, [name], MD   450 mg at 09/23/20 0749  . magnesium hydroxide (MILK OF MAGNESIA) suspension 30 mL  30 mL Oral Daily PRN Clapacs, [name], MD      . metFORMIN (GLUCOPHAGE) tablet 1,000 mg  1,000 mg Oral BID WC Clapacs, [name], MD   1,000 mg at 09/23/20 0752  . nicotine polacrilex (NICORETTE) gum 2 mg  2 mg Oral Q4H PRN [name], MD   2 mg at 09/22/20 1756  . OLANZapine (ZYPREXA) tablet 10 mg  10 mg Oral BID Clapacs, [name], MD   10 mg at 09/23/20 0755  . ondansetron (ZOFRAN) tablet 8 mg  8 mg Oral Q8H PRN [name], MD      . propranolol (INDERAL) tablet 30 mg  30 mg Oral QHS [name], MD   30 mg at 09/22/20 2105  . sertraline (ZOLOFT) tablet 100 mg  100 mg Oral Daily [name], MD   100 mg at 09/23/20 0750  . simvastatin (ZOCOR) tablet 40 mg  40 mg Oral q1800 Clapacs, [name], MD   40 mg at 09/22/20 1728  . traZODone (DESYREL) tablet 100 mg  100 mg Oral QHS Clapacs, John  T, MD   100 mg at 09/22/20 2104    Lab Results:  Results for orders placed or performed during the hospital encounter of 09/20/20 (from the past 48 hour(s))  Glucose, capillary     Status: None   Collection Time: 09/21/20  4:03 PM  Result Value Ref Range   Glucose-Capillary 90 70 - 99 mg/dL    Comment: Glucose reference range applies only to samples taken after fasting for at least 8 hours.  Glucose, capillary     Status: None   Collection Time: 09/21/20  8:10 PM  Result Value Ref Range   Glucose-Capillary 95 70 - 99 mg/dL    Comment: Glucose reference range applies only to samples taken after fasting for at least 8 hours.  Glucose, capillary     Status: Abnormal   Collection Time: 09/22/20  7:05 AM  Result Value Ref Range   Glucose-Capillary 129 (H) 70 - 99 mg/dL    Comment: Glucose reference range applies only to samples taken after fasting for at least 8 hours.  Glucose, capillary     Status: Abnormal   Collection Time: 09/22/20 11:20 AM  Result Value Ref Range   Glucose-Capillary 175 (H) 70 - 99 mg/dL    Comment: Glucose reference range applies only to samples taken after fasting for at least 8 hours.  Glucose, capillary     Status: None   Collection Time: 09/22/20  3:54 PM  Result Value Ref Range   Glucose-Capillary 97 70 - 99 mg/dL    Comment: Glucose reference range applies only to samples taken after fasting for at least 8 hours.  Glucose, capillary     Status: Abnormal   Collection Time: 09/22/20  8:43 PM  Result Value Ref Range   Glucose-Capillary 112 (H) 70 - 99 mg/dL    Comment: Glucose reference range applies only to samples taken after fasting for at least 8 hours.    Comment 1 Notify RN   Glucose, capillary     Status: Abnormal   Collection Time: 09/23/20  7:48 AM  Result Value Ref Range   Glucose-Capillary 112 (H) 70 - 99 mg/dL    Comment: Glucose reference range applies only to samples taken after fasting for at least 8 hours.  Glucose, capillary     Status: None   Collection Time: 09/23/20 10:42 AM  Result Value Ref Range   Glucose-Capillary 87 70 - 99 mg/dL    Comment: Glucose reference range applies only to samples taken after fasting for at least 8 hours.    Blood Alcohol level:  Lab Results  Component Value Date   ETH <10 09/19/2020   ETH <10 07/14/2020    Metabolic Disorder Labs: Lab Results  Component Value Date   HGBA1C 5.1 09/20/2020   MPG 99.67 09/20/2020   MPG 105.41 07/16/2020   Lab Results  Component Value Date   PROLACTIN 2.0 (L) 08/31/2016   PROLACTIN 4.8 05/22/2016   Lab Results  Component Value Date   CHOL 157 09/20/2020   TRIG 447 (H) 09/20/2020   HDL 32 (L) 09/20/2020   CHOLHDL 4.9 09/20/2020   VLDL UNABLE TO CALCULATE IF TRIGLYCERIDE OVER 400 mg/dL 27/12/8673   LDLCALC UNABLE TO CALCULATE IF TRIGLYCERIDE OVER 400 mg/dL 44/92/0100   LDLCALC 53 07/16/2020    Physical Findings: AIMS:  , ,  ,  ,    CIWA:    COWS:     Musculoskeletal: Strength & Muscle Tone: within normal limits Gait & Station: normal Patient  leans: N/A  Psychiatric Specialty Exam:  Presentation  General Appearance: Casual  Eye Contact:Fleeting  Speech:Clear and Coherent  Speech Volume:Normal  Handedness:Right   Mood and Affect  Mood:Depressed; Irritable  Affect:Congruent   Thought Process  Thought Processes:Goal Directed  Descriptions of Associations:Intact  Orientation:Full (Time, Place and Person)  Thought Content:Perseveration  History of Schizophrenia/Schizoaffective disorder:Yes  Duration of Psychotic Symptoms:Greater than six months  Hallucinations:Denies Ideas of Reference:Paranoia  Suicidal  Thoughts: Endorses SI, but contracts for safety in the hospital  Homicidal Thoughts: States he wishes to harm group home members  Sensorium  Memory:Immediate Fair; Remote Fair; Recent Fair  Judgment:Poor  Insight:Shallow   Executive Functions  Concentration:Fair  Attention Span:Fair  Recall:Fair  Fund of Knowledge:Fair  Language:Fair   Psychomotor Activity  Psychomotor Activity: Restless, spends much of the day pacing the hallways  Assets  Assets:Communication Skills; Desire for Improvement; Financial Resources/Insurance; Housing; Resilience   Sleep  Sleep:Good, 8.25 hours   Physical Exam: Physical Exam  ROS  Blood pressure 128/67, pulse 76, temperature (!) 97.5 F (36.4 C), temperature source Oral, resp. rate 17, height 5\' 10"  (1.778 m), weight 82.6 kg, SpO2 97 %. Body mass index is 26.11 kg/m.   Treatment Plan Summary: Daily contact with patient to assess and evaluate symptoms and progress in treatment and Medication management 1) Schizoaffective Disorder, depressive type- established problem, unstable, current suicidal thoughts - Continue Zoloft 100 mg daily, continue Zyprexa 10 mg BID, Clozapine 500 mg QHS, Lithium 450 mg BID (level 1.12- not trough) trazodone 100 mg QHS. Continue Propranolol 30 mg QHS for anxiety   2) Diabetes Melltius Type 2 - Continue Metformin 1000 mg BID, sliding scale insulin, levimer 6 mg BID, diabetic coordinator consult  3) HTN- established problem, stable - Continue lisinipril 2.5 mg daily  4) Long-term anticoagulation- continue apixiban 5 mg BID  5) Hyperlipidemia- continue Simvastin 40 mg daily at 6PM.  09/23/20: Psychiatric exam above reviewed and remains accurate. Assessment and plan above reviewed and updated.     Jesse SansMegan M Markes Shatswell, MD 09/23/2020, 12:30 PM

## 2020-09-23 NOTE — Progress Notes (Signed)
Recreation Therapy Notes  INPATIENT RECREATION THERAPY ASSESSMENT  Patient Details Name: Lance [name] MRN: [id number] DOB: 09/19/1975 Today's Date: 09/23/2020       Information Obtained From: Patient  Able to Participate in Assessment/Interview: Yes  Patient Presentation: Responsive  Reason for Admission (Per Patient): Active Symptoms  Patient Stressors: Other (Comment) (Group home)  Coping Skills:   Music,Talk,Art  Leisure Interests (2+):  Art - Coloring,Music - Play instrument,Music - Listen,Individual - TV  Frequency of Recreation/Participation: Monthly  Awareness of Community Resources:  Yes  Community Resources:     Current Use: No  If no, Barriers?: Transportation,Financial,Other (Comment) (They do not take me anywhere)  Expressed Interest in [employer] Information: Yes  County of Residence:  [place]  Patient Main Form of Transportation:  (I do not go anywhere)  Patient Strengths:  N/A  Patient Identified Areas of Improvement:  To get a new group home  Patient Goal for Hospitalization:  To get help with my anxiety and find a new place to live  Current SI (including self-harm):  Yes (No plan)  Current HI:  No  Current AVH: No  Staff Intervention Plan: Group Attendance,Collaborate with Interdisciplinary Treatment Team  Consent to Intern Participation: N/A  Dalynn Jhaveri 09/23/2020, 3:45 PM

## 2020-09-24 DIAGNOSIS — F251 Schizoaffective disorder, depressive type: Secondary | ICD-10-CM | POA: Diagnosis not present

## 2020-09-24 LAB — GLUCOSE, CAPILLARY
Glucose-Capillary: 111 mg/dL — ABNORMAL HIGH (ref 70–99)
Glucose-Capillary: 98 mg/dL (ref 70–99)
Glucose-Capillary: 115 mg/dL — ABNORMAL HIGH (ref 70–99)
Glucose-Capillary: 110 mg/dL — ABNORMAL HIGH (ref 70–99)

## 2020-09-24 MED ORDER — HYDROXYZINE HCL 50 MG PO TABS
100.0000 mg | ORAL_TABLET | Freq: Every day | ORAL | Status: DC
  Administered 2020-09-24 – 2020-09-25 (×2): 100 mg via ORAL
  Filled 2020-09-24 (×2): qty 2

## 2020-09-24 NOTE — Plan of Care (Signed)
  Problem: Education: Goal: Emotional status will improve Outcome: Progressing   Problem: Education: Goal: Mental status will improve Outcome: Progressing   Problem: Education: Goal: Verbalization of understanding the information provided will improve Outcome: Progressing   Problem: Activity: Goal: Sleeping patterns will improve Outcome: Progressing   Problem: Coping: Goal: Ability to demonstrate self-control will improve Outcome: Progressing   Problem: Education: Goal: Knowledge of the prescribed therapeutic regimen will improve Outcome: Progressing   Problem: Coping: Goal: Coping ability will improve Outcome: Progressing   Problem: Coping: Goal: Will verbalize feelings Outcome: Progressing   Problem: Coping: Goal: Ability to identify and develop effective coping behavior will improve Outcome: Progressing

## 2020-09-24 NOTE — [facility] Group Notes (Signed)
LCSW Group Therapy Note     09/24/2020 2:42 PM     Type of Therapy/Topic:  Group Therapy:  Feelings about Diagnosis     Participation Level:  Minimal     Description of Group:   This group will allow patients to explore their thoughts and feelings about diagnoses they have received. Patients will be guided to explore their level of understanding and acceptance of these diagnoses. Facilitator will encourage patients to process their thoughts and feelings about the reactions of others to their diagnosis and will guide patients in identifying ways to discuss their diagnosis with significant others in their lives. This group will be process-oriented, with patients participating in exploration of their own experiences, giving and receiving support, and processing challenge from other group members.        Therapeutic Goals:  1.    Patient will demonstrate understanding of diagnosis as evidenced by identifying two or more symptoms of the disorder  2.    Patient will be able to express two feelings regarding the diagnosis  3.    Patient will demonstrate their ability to communicate their needs through discussion and/or role play     Summary of Patient Progress: Patient was not present for the entirety of the group session. Pt left a couple of times throughout group but did not stay on topic during discussion. Pt did not discuss his mental health diagnosis but did state he was feeling ok today.   Therapeutic Modalities:   Cognitive Behavioral Therapy  Brief Therapy  Feelings Identification    Delainey Winstanley [place], MSW, LCSW-A  09/24/2020 2:42 PM

## 2020-09-24 NOTE — BHH Counselor (Signed)
CSW faxed consents for guardians to sign. Guardian provided verbal consent in the interim until consents forms are faxed back to CSW on 09/23/20  Kwesi Sangha [place], MSW, LCSW-A 4/12/20224:05 PM

## 2020-09-24 NOTE — [place] FL2 (Signed)
[place] MEDICAID FL2 LEVEL OF CARE SCREENING TOOL     IDENTIFICATION  Patient Name: Lance [name] Birthdate: June 27, 1975 Sex: male Admission Date (Current Location): 09/20/2020  [place] and [place] Number:  Chiropodist and Address:  [facility], [address], [place], [place] [address]      Provider Number:    Attending Physician Name and Address:  [name], MD  Relative Name and Phone Number:  [name], legal guardian, [phone]    Current Level of Care: Hospital Recommended Level of Care: Other (Comment) (Group Home) Prior Approval Number:    Date Approved/Denied:   PASRR Number:    Discharge Plan: Other (Comment) (Group Home)    Current Diagnoses: Patient Active Problem List   Diagnosis Date Noted  . Schizoaffective disorder, depressive type (HCC) 09/20/2020  . Alcohol abuse 04/12/2018  . Tobacco use disorder 09/01/2016  . Schizoaffective disorder, bipolar type (HCC) 05/22/2016  . Diabetes mellitus without complication (HCC) 05/21/2016  . Hyperlipidemia 05/21/2016  . History of pulmonary embolism 05/21/2016  . Chronic anticoagulation 05/21/2016  . Hypertension 09/25/2015    Orientation RESPIRATION BLADDER Height & Weight     Self,Time,Situation,Place  Normal Continent Weight: 82.6 kg Height:  5\' 10"  (177.8 cm)  BEHAVIORAL SYMPTOMS/MOOD NEUROLOGICAL BOWEL NUTRITION STATUS     (none) Continent  (WNL)  AMBULATORY STATUS COMMUNICATION OF NEEDS Skin   Independent Verbally Normal                       Personal Care Assistance Level of Assistance   (none needed)           Functional Limitations Info   (none)          SPECIAL CARE FACTORS FREQUENCY                       Contractures Contractures Info: Not present    Additional Factors Info                  Current Medications (09/24/2020):  This is the current hospital active medication list Current Facility-Administered  Medications  Medication Dose Route Frequency Provider Last Rate Last Admin  . acetaminophen (TYLENOL) tablet 650 mg  650 mg Oral Q6H PRN Clapacs, [name], MD      . albuterol (VENTOLIN HFA) 108 (90 Base) MCG/ACT inhaler 1-2 puff  1-2 puff Inhalation Q6H PRN Clapacs, [name], MD      . alum & mag hydroxide-simeth (MAALOX/MYLANTA) 200-200-20 MG/5ML suspension 30 mL  30 mL Oral Q4H PRN Clapacs, [name], MD      . apixaban (ELIQUIS) tablet 5 mg  5 mg Oral BID Clapacs, 12-22-2000, MD   5 mg at 09/24/20 0755  . cloZAPine (CLOZARIL) tablet 500 mg  500 mg Oral QHS Clapacs, 11/24/20, MD   500 mg at 09/23/20 2101  . fluticasone furoate-vilanterol (BREO ELLIPTA) 200-25 MCG/INH 1 puff  1 puff Inhalation Daily Clapacs, 2102, MD   1 puff at 09/24/20 0755  . hydrOXYzine (ATARAX/VISTARIL) tablet 100 mg  100 mg Oral QHS 11/24/20, MD      . insulin aspart (novoLOG) injection 0-9 Units  0-9 Units Subcutaneous TID WC Clapacs, [name], MD   2 Units at 09/22/20 1122  . insulin detemir (LEVEMIR) injection 6 Units  6 Units Subcutaneous BID 11/22/20, MD   6 Units at 09/24/20 0940  . lisinopril (ZESTRIL) tablet  2.5 mg  2.5 mg Oral Daily Clapacs, [name], MD   2.5 mg at 09/24/20 0754  . lithium carbonate (ESKALITH) CR tablet 450 mg  450 mg Oral Q12H Clapacs, [name], MD   450 mg at 09/24/20 0753  . magnesium hydroxide (MILK OF MAGNESIA) suspension 30 mL  30 mL Oral Daily PRN Clapacs, [name], MD      . metFORMIN (GLUCOPHAGE) tablet 1,000 mg  1,000 mg Oral BID WC Clapacs, [name], MD   1,000 mg at 09/24/20 0753  . nicotine polacrilex (NICORETTE) gum 2 mg  2 mg Oral Q4H PRN [name], MD   2 mg at 09/24/20 1145  . OLANZapine (ZYPREXA) tablet 10 mg  10 mg Oral BID Clapacs, [name], MD   10 mg at 09/24/20 0754  . ondansetron (ZOFRAN) tablet 8 mg  8 mg Oral Q8H PRN [name], MD      . propranolol (INDERAL) tablet 30 mg  30 mg Oral QHS [name], MD   30 mg at 09/23/20 2100  . sertraline (ZOLOFT) tablet  100 mg  100 mg Oral Daily [name], MD   100 mg at 09/24/20 0753  . simvastatin (ZOCOR) tablet 40 mg  40 mg Oral q1800 Clapacs, [name], MD   40 mg at 09/23/20 1711  . traZODone (DESYREL) tablet 100 mg  100 mg Oral QHS Clapacs, [name], MD   100 mg at 09/23/20 2101     Discharge Medications: Please see discharge summary for a list of discharge medications.  Relevant Imaging Results:  Relevant Lab Results:   Additional Information None at this time.  Corky Crafts, LCSWA

## 2020-09-24 NOTE — Progress Notes (Signed)
Progress Note:   Luby appears pleasant and stable. Patient completed his own hygiene and washed his clothes. Patient is medication compliant. Patient was given orange juice and apple sauce because blood sugar was 98 at 1100. Patient enjoyed some time in the courtyard with social workers. Patient denies SI HI and AVH. Will continue to monitor with15 minute safety checks.

## 2020-09-24 NOTE — Progress Notes (Signed)
Patient is med compliant this evening. Tolerated his meds without incident. He denies SI  HI  AVH but continues to endorse anxiety and depression.  Continues to blame his anxiety and depressive symptoms on situation at group home and is asking for assistance with moving to a new place. CBG was 141 tonight and QHS insulin was provided.  Remains safe at this time with Q 15 minute rounds and was encouraged to come to staff with any concerns.     Cleo Butler-Nicholson, LPN

## 2020-09-24 NOTE — Progress Notes (Signed)
Patient is calm and cooperative. Walking the halls and hanging out in the dayroom.  Was receptive to the news that this writer would be his nurse for the night.  He reports sleeping better and believes that this medication regimen is working for his anxiety.       Cleo Butler-Nicholson, LPN

## 2020-09-24 NOTE — Progress Notes (Signed)
[facility] MD Progress Note  09/24/2020 2:03 PM Lance [name]  MRN:  [id number]   CC "Can you increase my medication?"  Subjective:  45 year old male presenting with worsening anxiety, depression, and suicidal ideations with plan to cut himself. This morning patient seen one-on-one. He reports partial relief of anxiety symptoms at night, but states anxiety continues to prevent him from falling asleep at night. Will schedule vistaril tonight. He continues to endorse passive SI, but contracts for safety. He denies any homicidal ideations, visual hallucinations, and auditory hallucinations. No side effects to current medications.    Principal Problem: Schizoaffective disorder, depressive type (HCC) Diagnosis: Principal Problem:   Schizoaffective disorder, depressive type (HCC) Active Problems:   Hypertension   Diabetes mellitus without complication (HCC)   Hyperlipidemia   Chronic anticoagulation   Tobacco use disorder  Total Time spent with patient: 30 minutes  Past Psychiatric History: See H&P  Past Medical History:  Past Medical History:  Diagnosis Date  . Depression   . Diabetes mellitus without complication (HCC)   . Hyperlipidemia   . Hypertension   . Lupus anticoagulant disorder (HCC) 06/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  . PE (pulmonary thromboembolism) (HCC)   . Schizo affective schizophrenia (HCC)   . Supratherapeutic INR 11/19/2016   History reviewed. No pertinent surgical history. Family History:  Family History  Problem Relation Age of Onset  . CAD Mother   . CAD Sister    Family Psychiatric  History: See H&P Social History:  Social History   Substance and Sexual Activity  Alcohol Use No   Comment: occassionally     Social History   Substance and Sexual Activity  Drug Use Not Currently  . Types: Marijuana   Comment: Last use 11/29/16    Social History   Socioeconomic History  . Marital status: Single    Spouse name: Not on file  . Number  of children: Not on file  . Years of education: Not on file  . Highest education level: Not on file  Occupational History  . Not on file  Tobacco Use  . Smoking status: Current Every Day Smoker    Packs/day: 1.00    Years: 23.00    Pack years: 23.00    Types: Cigarettes  . Smokeless tobacco: Never Used  . Tobacco comment: will provide material  Substance and Sexual Activity  . Alcohol use: No    Comment: occassionally  . Drug use: Not Currently    Types: Marijuana    Comment: Last use 11/29/16  . Sexual activity: Never    Birth control/protection: None    Comment: occasional marijuana- none recently  Other Topics Concern  . Not on file  Social History Narrative   From a group home in [place]   Social Determinants of Health   Financial Resource Strain: Not on file  Food Insecurity: Not on file  Transportation Needs: Not on file  Physical Activity: Not on file  Stress: Not on file  Social Connections: Not on file   Additional Social History:    Sleep: Fair  Appetite:  Good  Current Medications: Current Facility-Administered Medications  Medication Dose Route Frequency Provider Last Rate Last Admin  . acetaminophen (TYLENOL) tablet 650 mg  650 mg Oral Q6H PRN Clapacs, [name], MD      . albuterol (VENTOLIN HFA) 108 (90 Base) MCG/ACT inhaler 1-2 puff  1-2 puff Inhalation Q6H PRN Clapacs, [name], MD      . alum &  mag hydroxide-simeth (MAALOX/MYLANTA) 200-200-20 MG/5ML suspension 30 mL  30 mL Oral Q4H PRN Clapacs, [name], MD      . apixaban (ELIQUIS) tablet 5 mg  5 mg Oral BID Clapacs, [name], MD   5 mg at 09/24/20 0755  . cloZAPine (CLOZARIL) tablet 500 mg  500 mg Oral QHS Clapacs, [name], MD   500 mg at 09/23/20 2101  . fluticasone furoate-vilanterol (BREO ELLIPTA) 200-25 MCG/INH 1 puff  1 puff Inhalation Daily Clapacs, [name], MD   1 puff at 09/24/20 0755  . hydrOXYzine (ATARAX/VISTARIL) tablet 100 mg  100 mg Oral QHS [name], MD      . insulin aspart  (novoLOG) injection 0-9 Units  0-9 Units Subcutaneous TID WC Clapacs, [name], MD   2 Units at 09/22/20 1122  . insulin detemir (LEVEMIR) injection 6 Units  6 Units Subcutaneous BID [name], MD   6 Units at 09/24/20 0940  . lisinopril (ZESTRIL) tablet 2.5 mg  2.5 mg Oral Daily Clapacs, [name], MD   2.5 mg at 09/24/20 0754  . lithium carbonate (ESKALITH) CR tablet 450 mg  450 mg Oral Q12H Clapacs, [name], MD   450 mg at 09/24/20 0753  . magnesium hydroxide (MILK OF MAGNESIA) suspension 30 mL  30 mL Oral Daily PRN Clapacs, [name], MD      . metFORMIN (GLUCOPHAGE) tablet 1,000 mg  1,000 mg Oral BID WC Clapacs, [name], MD   1,000 mg at 09/24/20 0753  . nicotine polacrilex (NICORETTE) gum 2 mg  2 mg Oral Q4H PRN [name], MD   2 mg at 09/24/20 1145  . OLANZapine (ZYPREXA) tablet 10 mg  10 mg Oral BID Clapacs, [name], MD   10 mg at 09/24/20 0754  . ondansetron (ZOFRAN) tablet 8 mg  8 mg Oral Q8H PRN [name], MD      . propranolol (INDERAL) tablet 30 mg  30 mg Oral QHS [name], MD   30 mg at 09/23/20 2100  . sertraline (ZOLOFT) tablet 100 mg  100 mg Oral Daily [name], MD   100 mg at 09/24/20 0753  . simvastatin (ZOCOR) tablet 40 mg  40 mg Oral q1800 Clapacs, [name], MD   40 mg at 09/23/20 1711  . traZODone (DESYREL) tablet 100 mg  100 mg Oral QHS Clapacs, [name], MD   100 mg at 09/23/20 2101    Lab Results:  Results for orders placed or performed during the hospital encounter of 09/20/20 (from the past 48 hour(s))  Glucose, capillary     Status: None   Collection Time: 09/22/20  3:54 PM  Result Value Ref Range   Glucose-Capillary 97 70 - 99 mg/dL    Comment: Glucose reference range applies only to samples taken after fasting for at least 8 hours.  Glucose, capillary     Status: Abnormal   Collection Time: 09/22/20  8:43 PM  Result Value Ref Range   Glucose-Capillary 112 (H) 70 - 99 mg/dL    Comment: Glucose reference range applies only to samples taken  after fasting for at least 8 hours.   Comment 1 Notify RN   Glucose, capillary     Status: Abnormal   Collection Time: 09/23/20  7:48 AM  Result Value Ref Range   Glucose-Capillary 112 (H) 70 - 99 mg/dL    Comment: Glucose reference range applies only to samples taken after fasting for at least 8  hours.  Glucose, capillary     Status: None   Collection Time: 09/23/20 10:42 AM  Result Value Ref Range   Glucose-Capillary 87 70 - 99 mg/dL    Comment: Glucose reference range applies only to samples taken after fasting for at least 8 hours.  Glucose, capillary     Status: Abnormal   Collection Time: 09/23/20  4:10 PM  Result Value Ref Range   Glucose-Capillary 101 (H) 70 - 99 mg/dL    Comment: Glucose reference range applies only to samples taken after fasting for at least 8 hours.  Glucose, capillary     Status: Abnormal   Collection Time: 09/23/20  8:55 PM  Result Value Ref Range   Glucose-Capillary 141 (H) 70 - 99 mg/dL    Comment: Glucose reference range applies only to samples taken after fasting for at least 8 hours.  Glucose, capillary     Status: Abnormal   Collection Time: 09/24/20  7:15 AM  Result Value Ref Range   Glucose-Capillary 111 (H) 70 - 99 mg/dL    Comment: Glucose reference range applies only to samples taken after fasting for at least 8 hours.  Glucose, capillary     Status: None   Collection Time: 09/24/20 10:46 AM  Result Value Ref Range   Glucose-Capillary 98 70 - 99 mg/dL    Comment: Glucose reference range applies only to samples taken after fasting for at least 8 hours.    Blood Alcohol level:  Lab Results  Component Value Date   ETH <10 09/19/2020   ETH <10 07/14/2020    Metabolic Disorder Labs: Lab Results  Component Value Date   HGBA1C 5.1 09/20/2020   MPG 99.67 09/20/2020   MPG 105.41 07/16/2020   Lab Results  Component Value Date   PROLACTIN 2.0 (L) 08/31/2016   PROLACTIN 4.8 05/22/2016   Lab Results  Component Value Date   CHOL 157  09/20/2020   TRIG 447 (H) 09/20/2020   HDL 32 (L) 09/20/2020   CHOLHDL 4.9 09/20/2020   VLDL UNABLE TO CALCULATE IF TRIGLYCERIDE OVER 400 mg/dL 24/40/102704/01/2021   LDLCALC UNABLE TO CALCULATE IF TRIGLYCERIDE OVER 400 mg/dL 25/36/644004/01/2021   LDLCALC 53 07/16/2020    Physical Findings: AIMS:  , ,  ,  ,    CIWA:    COWS:     Musculoskeletal: Strength & Muscle Tone: within normal limits Gait & Station: normal Patient leans: N/A  Psychiatric Specialty Exam:  Presentation  General Appearance: Casual  Eye Contact:Fleeting  Speech:Clear and Coherent  Speech Volume:Normal  Handedness:Right   Mood and Affect  Mood:Depressed; Irritable  Affect:Congruent   Thought Process  Thought Processes:Goal Directed  Descriptions of Associations:Intact  Orientation:Full (Time, Place and Person)  Thought Content:Perseveration  History of Schizophrenia/Schizoaffective disorder:Yes  Duration of Psychotic Symptoms:Greater than six months  Hallucinations:Denies Ideas of Reference:Paranoia  Suicidal Thoughts: Endorses SI, but contracts for safety in the hospital  Homicidal Thoughts: States he wishes to harm group home members  Sensorium  Memory:Immediate Fair; Remote Fair; Recent Fair  Judgment:Poor  Insight:Shallow   Executive Functions  Concentration:Fair  Attention Span:Fair  Recall:Fair  Fund of Knowledge:Fair  Language:Fair   Psychomotor Activity  Psychomotor Activity: Restless, spends much of the day pacing the hallways  Assets  Assets:Communication Skills; Desire for Improvement; Financial Resources/Insurance; Housing; Resilience   Sleep  Sleep:Fair, 7 hours   Physical Exam: Physical Exam  ROS  Blood pressure 103/62, pulse (!) 55, temperature 97.7 F (36.5 C), temperature source Oral, resp. rate  18, height 5\' 10"  (1.778 m), weight 82.6 kg, SpO2 97 %. Body mass index is 26.11 kg/m.   Treatment Plan Summary: Daily contact with patient to assess and  evaluate symptoms and progress in treatment and Medication management 1) Schizoaffective Disorder, depressive type- established problem, unstable, current suicidal thoughts - Continue Zoloft 100 mg daily, continue Zyprexa 10 mg BID, Clozapine 500 mg QHS, Lithium 450 mg BID (level 1.12- not trough) trazodone 100 mg QHS. Continue Propranolol 30 mg QHS for anxiety. Will also schedule vistaril 100 mg QHS for nighttime anxiety starting tonight  2) Diabetes Melltius Type 2 - Continue Metformin 1000 mg BID, sliding scale insulin, levimer 6 mg BID, diabetic coordinator consult  3) HTN- established problem, stable - Continue lisinipril 2.5 mg daily  4) Long-term anticoagulation- continue apixiban 5 mg BID  5) Hyperlipidemia- continue Simvastin 40 mg daily at 6PM.  09/24/20: Psychiatric exam above reviewed and remains accurate. Assessment and plan above reviewed and updated.      11/24/20, MD 09/24/2020, 2:03 PM

## 2020-09-24 NOTE — BHH Counselor (Addendum)
ADDENDUM CSW faxed FL2 to [facility], at [phone], Patient's legal guardian provided verbal consent.   Signed:  Corky Crafts, MSW, [place], LCASA 09/24/2020 4:01 PM  CSW received call from [facility], requesting an FL2 for placement. CSW obliged and completed. Awaiting physician cosign and will provide signed FL2 to [facility].  Signed:  Corky Crafts, MSW, [place], LCASA 09/24/2020 3:45 PM

## 2020-09-24 NOTE — Progress Notes (Signed)
Recreation Therapy Notes    Date: 09/24/2020   Time: 9:30 am   Location: Craft room    Behavioral response: N/A   Intervention Topic: Wellness    Discussion/Intervention: Patient did not attend group.   Clinical Observations/Feedback:  Patient did not attend group.   Promise Weldin LRT/CTRS          Rakisha Pincock 09/24/2020 11:16 AM

## 2020-09-25 DIAGNOSIS — F251 Schizoaffective disorder, depressive type: Secondary | ICD-10-CM | POA: Diagnosis not present

## 2020-09-25 LAB — GLUCOSE, CAPILLARY
Glucose-Capillary: 110 mg/dL — ABNORMAL HIGH (ref 70–99)
Glucose-Capillary: 100 mg/dL — ABNORMAL HIGH (ref 70–99)
Glucose-Capillary: 105 mg/dL — ABNORMAL HIGH (ref 70–99)
Glucose-Capillary: 198 mg/dL — ABNORMAL HIGH (ref 70–99)

## 2020-09-25 MED ORDER — PROPRANOLOL HCL 10 MG PO TABS: 30.0000 mg | ORAL_TABLET | Freq: Every day | ORAL | 1 refills | Status: DC

## 2020-09-25 MED ORDER — HYDROXYZINE HCL 50 MG PO TABS: 100.0000 mg | ORAL_TABLET | Freq: Every day | ORAL | 1 refills | Status: DC

## 2020-09-25 NOTE — Progress Notes (Signed)
Patient alert and oriented x 4, affect is flat but she brightens upon approach , no distress noted, his thoughts are organized and coherent, he appears less anxious, less pacing on the unit, he was interacting appropriately with peers and staff. 15 minutes safety checks maintained will continue to monitor.

## 2020-09-25 NOTE — Progress Notes (Signed)
Pt rates depression 6/10 and anxiety 5/10. Pt denies SI, HI and AVH. Pt was calm, cooperative and med compliant today. Pt received PRNs today. Pt anticipates discharge tomorrow. Torrie Mayers RN

## 2020-09-25 NOTE — B[facility] Group Notes (Signed)
LCSW Group Therapy Note  09/25/2020 2:28 PM  Type of Therapy/Topic:  Group Therapy:  Emotion Regulation  Participation Level:  Did Not Attend   Description of Group:   The purpose of this group is to assist patients in learning to regulate negative emotions and experience positive emotions. Patients will be guided to discuss ways in which they have been vulnerable to their negative emotions. These vulnerabilities will be juxtaposed with experiences of positive emotions or situations, and patients will be challenged to use positive emotions to combat negative ones. Special emphasis will be placed on coping with negative emotions in conflict situations, and patients will process healthy conflict resolution skills.  Therapeutic Goals: 1. Patient will identify two positive emotions or experiences to reflect on in order to balance out negative emotions 2. Patient will label two or more emotions that they find the most difficult to experience 3. Patient will demonstrate positive conflict resolution skills through discussion and/or role plays  Summary of Patient Progress: Patient did not attend group despite encouraged participation.    Therapeutic Modalities:   Cognitive Behavioral Therapy Feelings Identification Dialectical Behavioral Therapy  Gwenevere Ghazi, MSW, [place], Bridget Hartshorn 09/25/2020 2:28 PM

## 2020-09-25 NOTE — Plan of Care (Signed)
  Problem: Education: Goal: Knowledge of [facility] Education information/materials will improve Outcome: Progressing Goal: Emotional status will improve Outcome: Progressing Goal: Mental status will improve Outcome: Progressing Goal: Verbalization of understanding the information provided will improve Outcome: Progressing   Problem: Coping: Goal: Ability to verbalize frustrations and anger appropriately will improve Outcome: Progressing Goal: Ability to demonstrate self-control will improve Outcome: Progressing   Problem: Education: Goal: Utilization of techniques to improve thought processes will improve Outcome: Progressing Goal: Knowledge of the prescribed therapeutic regimen will improve Outcome: Progressing   Problem: Health Behavior/Discharge Planning: Goal: Ability to make decisions will improve Outcome: Progressing Goal: Compliance with therapeutic regimen will improve Outcome: Progressing

## 2020-09-25 NOTE — Progress Notes (Signed)
Recreation Therapy Notes  Date: 09/25/2020  Time: 9:30 am   Location: Courtyard    Behavioral response: N/A   Intervention Topic: Social skills    Discussion/Intervention: Patient did not attend group.   Clinical Observations/Feedback:  Patient did not attend group.   Kedra Mcglade LRT/CTRS        Ellasyn Swilling 09/25/2020 10:48 AM 

## 2020-09-25 NOTE — [facility] Counselor (Signed)
CSW contacted Lance [name], pt's guardian and was given initial IM information. CSW also informed guardian of Medicare Message to inform her of rights to appeal hospital discharge. Guardian provided verbal consent of form and CSW will fax guardian Medicare paperwork and will put in pt's chart once returned.  Lance [name], MSW, LCSW-A 4/13/202211:34 AM

## 2020-09-25 NOTE — Care Management Important Message (Signed)
Important Message  Patient Details  Name: Albi Rappaport MRN: [id number] Date of Birth: 02-Nov-1975   Medicare Important Message Given:  Yes Message given to guardian who verbally consented of message receipt.    Keili Hasten A [place], LCSWA 09/25/2020, 12:09 PM

## 2020-09-25 NOTE — Progress Notes (Signed)
[facility] MD Progress Note  09/25/2020 1:08 PM Lance [name]  MRN:  [id number]   CC "Will I still take this medication when I leave?"  Subjective:  45 year old male presenting with worsening anxiety, depression, and suicidal ideations with plan to cut himself. No acute events overnight, medication compliant, attending to ADLs. Patient seen one-on-one this morning. He feels the combination of vistaril and propranolol are helping with his anxiety. He denies any suicidal ideations, homicidal ideations, visual hallucinations, or auditory hallucinations. He still desires to move to a new group home, but is aware that he will need to return to his current group home while his ACT Team and guardian search for a new place. He denies any desire to harm others at his group home.   Contacted his group home [phone], and spoke with Harriett Sine. She reports that Lance [name] had seemed to be at his baseline prior to coming to the hospital, and they had no concerns about his safety. They are able to accept Lance [name] back to the group home. Tentatively able to get him tomorrow morning, but have requested a call back in the AM.   Principal Problem: Schizoaffective disorder, depressive type (HCC) Diagnosis: Principal Problem:   Schizoaffective disorder, depressive type (HCC) Active Problems:   Hypertension   Diabetes mellitus without complication (HCC)   Hyperlipidemia   Chronic anticoagulation   Tobacco use disorder  Total Time spent with patient: 30 minutes  Past Psychiatric History: See H&P  Past Medical History:  Past Medical History:  Diagnosis Date  . Depression   . Diabetes mellitus without complication (HCC)   . Hyperlipidemia   . Hypertension   . Lupus anticoagulant disorder (HCC) 06/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  . PE (pulmonary thromboembolism) (HCC)   . Schizo affective schizophrenia (HCC)   . Supratherapeutic INR 11/19/2016   History reviewed. No pertinent surgical history. Family  History:  Family History  Problem Relation Age of Onset  . CAD Mother   . CAD Sister    Family Psychiatric  History: See H&P Social History:  Social History   Substance and Sexual Activity  Alcohol Use No   Comment: occassionally     Social History   Substance and Sexual Activity  Drug Use Not Currently  . Types: Marijuana   Comment: Last use 11/29/16    Social History   Socioeconomic History  . Marital status: Single    Spouse name: Not on file  . Number of children: Not on file  . Years of education: Not on file  . Highest education level: Not on file  Occupational History  . Not on file  Tobacco Use  . Smoking status: Current Every Day Smoker    Packs/day: 1.00    Years: 23.00    Pack years: 23.00    Types: Cigarettes  . Smokeless tobacco: Never Used  . Tobacco comment: will provide material  Substance and Sexual Activity  . Alcohol use: No    Comment: occassionally  . Drug use: Not Currently    Types: Marijuana    Comment: Last use 11/29/16  . Sexual activity: Never    Birth control/protection: None    Comment: occasional marijuana- none recently  Other Topics Concern  . Not on file  Social History Narrative   From a group home in [place]   Social Determinants of Health   Financial Resource Strain: Not on file  Food Insecurity: Not on file  Transportation Needs: Not on file  Physical  Activity: Not on file  Stress: Not on file  Social Connections: Not on file   Additional Social History:    Sleep: Fair  Appetite:  Good  Current Medications: Current Facility-Administered Medications  Medication Dose Route Frequency Provider Last Rate Last Admin  . acetaminophen (TYLENOL) tablet 650 mg  650 mg Oral Q6H PRN Clapacs, [name], MD      . albuterol (VENTOLIN HFA) 108 (90 Base) MCG/ACT inhaler 1-2 puff  1-2 puff Inhalation Q6H PRN Clapacs, [name], MD      . alum & mag hydroxide-simeth (MAALOX/MYLANTA) 200-200-20 MG/5ML suspension 30 mL  30 mL Oral  Q4H PRN Clapacs, [name], MD      . apixaban (ELIQUIS) tablet 5 mg  5 mg Oral BID Clapacs, [name], MD   5 mg at 09/25/20 0752  . cloZAPine (CLOZARIL) tablet 500 mg  500 mg Oral QHS Clapacs, [name], MD   500 mg at 09/24/20 2111  . fluticasone furoate-vilanterol (BREO ELLIPTA) 200-25 MCG/INH 1 puff  1 puff Inhalation Daily Clapacs, [name], MD   1 puff at 09/25/20 0751  . hydrOXYzine (ATARAX/VISTARIL) tablet 100 mg  100 mg Oral QHS [name], MD   100 mg at 09/24/20 2111  . insulin aspart (novoLOG) injection 0-9 Units  0-9 Units Subcutaneous TID WC Clapacs, [name], MD   2 Units at 09/22/20 1122  . insulin detemir (LEVEMIR) injection 6 Units  6 Units Subcutaneous BID [name], MD   6 Units at 09/25/20 1138  . lisinopril (ZESTRIL) tablet 2.5 mg  2.5 mg Oral Daily Clapacs, [name], MD   2.5 mg at 09/24/20 0754  . lithium carbonate (ESKALITH) CR tablet 450 mg  450 mg Oral Q12H Clapacs, [name], MD   450 mg at 09/25/20 0751  . magnesium hydroxide (MILK OF MAGNESIA) suspension 30 mL  30 mL Oral Daily PRN Clapacs, [name], MD      . metFORMIN (GLUCOPHAGE) tablet 1,000 mg  1,000 mg Oral BID WC Clapacs, [name], MD   1,000 mg at 09/25/20 0751  . nicotine polacrilex (NICORETTE) gum 2 mg  2 mg Oral Q4H PRN [name], MD   2 mg at 09/24/20 1145  . OLANZapine (ZYPREXA) tablet 10 mg  10 mg Oral BID Clapacs, [name], MD   10 mg at 09/25/20 0751  . ondansetron (ZOFRAN) tablet 8 mg  8 mg Oral Q8H PRN [name], MD      . propranolol (INDERAL) tablet 30 mg  30 mg Oral QHS [name], MD   30 mg at 09/24/20 2112  . sertraline (ZOLOFT) tablet 100 mg  100 mg Oral Daily [name], MD   100 mg at 09/25/20 0751  . simvastatin (ZOCOR) tablet 40 mg  40 mg Oral q1800 Clapacs, [name], MD   40 mg at 09/24/20 1707  . traZODone (DESYREL) tablet 100 mg  100 mg Oral QHS Clapacs, [name], MD   100 mg at 09/24/20 2111    Lab Results:  Results for orders placed or performed during the hospital  encounter of 09/20/20 (from the past 48 hour(s))  Glucose, capillary     Status: Abnormal   Collection Time: 09/23/20  4:10 PM  Result Value Ref Range   Glucose-Capillary 101 (H) 70 - 99 mg/dL    Comment: Glucose reference range applies only to samples taken after fasting for at least 8 hours.  Glucose, capillary  Status: Abnormal   Collection Time: 09/23/20  8:55 PM  Result Value Ref Range   Glucose-Capillary 141 (H) 70 - 99 mg/dL    Comment: Glucose reference range applies only to samples taken after fasting for at least 8 hours.  Glucose, capillary     Status: Abnormal   Collection Time: 09/24/20  7:15 AM  Result Value Ref Range   Glucose-Capillary 111 (H) 70 - 99 mg/dL    Comment: Glucose reference range applies only to samples taken after fasting for at least 8 hours.  Glucose, capillary     Status: None   Collection Time: 09/24/20 10:46 AM  Result Value Ref Range   Glucose-Capillary 98 70 - 99 mg/dL    Comment: Glucose reference range applies only to samples taken after fasting for at least 8 hours.  Glucose, capillary     Status: Abnormal   Collection Time: 09/24/20  4:01 PM  Result Value Ref Range   Glucose-Capillary 115 (H) 70 - 99 mg/dL    Comment: Glucose reference range applies only to samples taken after fasting for at least 8 hours.  Glucose, capillary     Status: Abnormal   Collection Time: 09/24/20  7:38 PM  Result Value Ref Range   Glucose-Capillary 110 (H) 70 - 99 mg/dL    Comment: Glucose reference range applies only to samples taken after fasting for at least 8 hours.  Glucose, capillary     Status: Abnormal   Collection Time: 09/25/20  7:47 AM  Result Value Ref Range   Glucose-Capillary 110 (H) 70 - 99 mg/dL    Comment: Glucose reference range applies only to samples taken after fasting for at least 8 hours.  Glucose, capillary     Status: Abnormal   Collection Time: 09/25/20 11:28 AM  Result Value Ref Range   Glucose-Capillary 100 (H) 70 - 99 mg/dL     Comment: Glucose reference range applies only to samples taken after fasting for at least 8 hours.    Blood Alcohol level:  Lab Results  Component Value Date   ETH <10 09/19/2020   ETH <10 07/14/2020    Metabolic Disorder Labs: Lab Results  Component Value Date   HGBA1C 5.1 09/20/2020   MPG 99.67 09/20/2020   MPG 105.41 07/16/2020   Lab Results  Component Value Date   PROLACTIN 2.0 (L) 08/31/2016   PROLACTIN 4.8 05/22/2016   Lab Results  Component Value Date   CHOL 157 09/20/2020   TRIG 447 (H) 09/20/2020   HDL 32 (L) 09/20/2020   CHOLHDL 4.9 09/20/2020   VLDL UNABLE TO CALCULATE IF TRIGLYCERIDE OVER 400 mg/dL 16/10/960404/01/2021   LDLCALC UNABLE TO CALCULATE IF TRIGLYCERIDE OVER 400 mg/dL 54/09/811904/01/2021   LDLCALC 53 07/16/2020    Physical Findings: AIMS:  , ,  ,  ,    CIWA:    COWS:     Musculoskeletal: Strength & Muscle Tone: within normal limits Gait & Station: normal Patient leans: N/A  Psychiatric Specialty Exam:  Presentation  General Appearance: Casual  Eye Contact:Fleeting  Speech:Clear and Coherent  Speech Volume:Normal  Handedness:Right   Mood and Affect  Mood:Euthymic Affect:Congruent   Thought Process  Thought Processes:Goal Directed  Descriptions of Associations:Intact  Orientation:Full (Time, Place and Person)  Thought Content:Perseveration  History of Schizophrenia/Schizoaffective disorder:Yes  Duration of Psychotic Symptoms:Greater than six months  Hallucinations:Denies Ideas of Reference:None Suicidal Thoughts: Denies Homicidal Thoughts: Denies  Sensorium  Memory:Immediate Fair; Remote Fair; Recent Fair  Judgment:Poor  Insight:Shallow   Executive  Functions  Concentration:Fair  Attention Span:Fair  Recall:Fair  Fund of Knowledge:Fair  Language:Fair   Psychomotor Activity  Psychomotor Activity: Restless, spends much of the day pacing the hallways  Assets  Assets:Communication Skills; Desire for Improvement;  Financial Resources/Insurance; Housing; Resilience   Sleep  Sleep:Fair, 7.5 hours   Physical Exam: Physical Exam  ROS  Blood pressure 118/65, pulse 75, temperature 98 F (36.7 C), temperature source Oral, resp. rate 17, height 5\' 10"  (1.778 m), weight 82.6 kg, SpO2 98 %. Body mass index is 26.11 kg/m.   Treatment Plan Summary: Daily contact with patient to assess and evaluate symptoms and progress in treatment and Medication management 1) Schizoaffective Disorder, depressive type- established problem, unstable, current suicidal thoughts - Continue Zoloft 100 mg daily, continue Zyprexa 10 mg BID, Clozapine 500 mg QHS, Lithium 450 mg BID (level 1.12- not trough) trazodone 100 mg QHS. Continue Propranolol 30 mg QHS for anxiety. Continue vistaril 100 mg QHS for nighttime anxiety starting tonight  2) Diabetes Melltius Type 2 - Continue Metformin 1000 mg BID, sliding scale insulin, levimer 6 mg BID, diabetic coordinator consult  3) HTN- established problem, stable - Continue lisinipril 2.5 mg daily  4) Long-term anticoagulation- continue apixiban 5 mg BID  5) Hyperlipidemia- continue Simvastin 40 mg daily at 6PM.  09/25/20: Psychiatric exam above reviewed and remains accurate. Assessment and plan above reviewed and updated.       09/27/20, MD 09/25/2020, 1:08 PM

## 2020-09-25 NOTE — Plan of Care (Signed)
Pt was educated on care plan and verbalizes understanding. Lance Mayers RN Problem: Education: Goal: Knowledge of [facility] Education information/materials will improve Outcome: Progressing Goal: Emotional status will improve Outcome: Progressing Goal: Mental status will improve Outcome: Progressing Goal: Verbalization of understanding the information provided will improve Outcome: Progressing   Problem: Activity: Goal: Interest or engagement in activities will improve Outcome: Progressing Goal: Sleeping patterns will improve Outcome: Progressing   Problem: Coping: Goal: Ability to verbalize frustrations and anger appropriately will improve Outcome: Progressing Goal: Ability to demonstrate self-control will improve Outcome: Progressing   Problem: Health Behavior/Discharge Planning: Goal: Identification of resources available to assist in meeting health care needs will improve Outcome: Progressing Goal: Compliance with treatment plan for underlying cause of condition will improve Outcome: Progressing   Problem: Physical Regulation: Goal: Ability to maintain clinical measurements within normal limits will improve Outcome: Progressing   Problem: Safety: Goal: Periods of time without injury will increase Outcome: Progressing   Problem: Education: Goal: Utilization of techniques to improve thought processes will improve Outcome: Progressing Goal: Knowledge of the prescribed therapeutic regimen will improve Outcome: Progressing   Problem: Activity: Goal: Interest or engagement in leisure activities will improve Outcome: Progressing Goal: Imbalance in normal sleep/wake cycle will improve Outcome: Progressing   Problem: Coping: Goal: Coping ability will improve Outcome: Progressing Goal: Will verbalize feelings Outcome: Progressing   Problem: Health Behavior/Discharge Planning: Goal: Ability to make decisions will improve Outcome: Progressing Goal:  Compliance with therapeutic regimen will improve Outcome: Progressing   Problem: Role Relationship: Goal: Will demonstrate positive changes in social behaviors and relationships Outcome: Progressing   Problem: Safety: Goal: Ability to disclose and discuss suicidal ideas will improve Outcome: Progressing Goal: Ability to identify and utilize support systems that promote safety will improve Outcome: Progressing   Problem: Self-Concept: Goal: Will verbalize positive feelings about self Outcome: Progressing Goal: Level of anxiety will decrease Outcome: Progressing   Problem: Education: Goal: Ability to state activities that reduce stress will improve Outcome: Progressing   Problem: Coping: Goal: Ability to identify and develop effective coping behavior will improve Outcome: Progressing   Problem: Self-Concept: Goal: Ability to identify factors that promote anxiety will improve Outcome: Progressing Goal: Level of anxiety will decrease Outcome: Progressing Goal: Ability to modify response to factors that promote anxiety will improve Outcome: Progressing

## 2020-09-26 DIAGNOSIS — F251 Schizoaffective disorder, depressive type: Secondary | ICD-10-CM | POA: Diagnosis not present

## 2020-09-26 LAB — CBC WITH DIFFERENTIAL/PLATELET
Abs Immature Granulocytes: 0.02 10*3/uL (ref 0.00–0.07)
Basophils Absolute: 0 10*3/uL (ref 0.0–0.1)
Basophils Relative: 0 %
Eosinophils Absolute: 0 10*3/uL (ref 0.0–0.5)
Eosinophils Relative: 0 %
HCT: 40.1 % (ref 39.0–52.0)
Hemoglobin: 14.2 g/dL (ref 13.0–17.0)
Immature Granulocytes: 0 %
Lymphocytes Relative: 30 %
Lymphs Abs: 1.6 10*3/uL (ref 0.7–4.0)
MCH: 32.6 pg (ref 26.0–34.0)
MCHC: 35.4 g/dL (ref 30.0–36.0)
MCV: 92.2 fL (ref 80.0–100.0)
Monocytes Absolute: 0.7 10*3/uL (ref 0.1–1.0)
Monocytes Relative: 13 %
Neutro Abs: 3.1 10*3/uL (ref 1.7–7.7)
Neutrophils Relative %: 57 %
Platelets: 206 10*3/uL (ref 150–400)
RBC: 4.35 MIL/uL (ref 4.22–5.81)
RDW: 12.4 % (ref 11.5–15.5)
WBC: 5.4 10*3/uL (ref 4.0–10.5)
nRBC: 0 % (ref 0.0–0.2)

## 2020-09-26 LAB — GLUCOSE, CAPILLARY: Glucose-Capillary: 115 mg/dL — ABNORMAL HIGH (ref 70–99)

## 2020-09-26 NOTE — Discharge Summary (Signed)
Physician Discharge Summary Note  Patient:  Lance [name] is an 45 y.o., male MRN:  [id number] DOB:  December 22, 1975 Patient phone:  [phone] (home)  [facility] [address] [place] [id number],  Total Time spent with patient: 35 minutes- 25 minutes face-to-face contact with patient, 10 minutes documentation, coordination of care, scripts   Date of Admission:  09/20/2020 Date of Discharge: 09/26/2020  Reason for Admission:  45 year old male presenting with worsening anxiety, depression, and suicidal ideations with plan to cut himself  Principal Problem: Schizoaffective disorder, depressive type (HCC) Discharge Diagnoses: Principal Problem:   Schizoaffective disorder, depressive type (HCC) Active Problems:   Hypertension   Diabetes mellitus without complication (HCC)   Hyperlipidemia   Chronic anticoagulation   Tobacco use disorder   Past Psychiatric History: Long history of mental illness diagnosis of schizoaffective disorder. Frequently presents with similar complaints of psychotic symptoms, paranoia, as well as suicidal ideation. Past history of cutting and self injury. Minimal past history of aggression.Used to drink or use marijuana regularly but has not been doing that lately.  Past Medical History:  Past Medical History:  Diagnosis Date  . Depression   . Diabetes mellitus without complication (HCC)   . Hyperlipidemia   . Hypertension   . Lupus anticoagulant disorder (HCC) 06/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  . PE (pulmonary thromboembolism) (HCC)   . Schizo affective schizophrenia (HCC)   . Supratherapeutic INR 11/19/2016   History reviewed. No pertinent surgical history. Family History:  Family History  Problem Relation Age of Onset  . CAD Mother   . CAD Sister    Family Psychiatric  History: None reported Social History:  Social History   Substance and Sexual Activity  Alcohol Use No   Comment:  occassionally     Social History   Substance and Sexual Activity  Drug Use Not Currently  . Types: Marijuana   Comment: Last use 11/29/16    Social History   Socioeconomic History  . Marital status: Single    Spouse name: Not on file  . Number of children: Not on file  . Years of education: Not on file  . Highest education level: Not on file  Occupational History  . Not on file  Tobacco Use  . Smoking status: Current Every Day Smoker    Packs/day: 1.00    Years: 23.00    Pack years: 23.00    Types: Cigarettes  . Smokeless tobacco: Never Used  . Tobacco comment: will provide material  Substance and Sexual Activity  . Alcohol use: No    Comment: occassionally  . Drug use: Not Currently    Types: Marijuana    Comment: Last use 11/29/16  . Sexual activity: Never    Birth control/protection: None    Comment: occasional marijuana- none recently  Other Topics Concern  . Not on file  Social History Narrative   [place]   Social Determinants of Health   Financial Resource Strain: Not on file  Food Insecurity: Not on file  Transportation Needs: Not on file  Physical Activity: Not on file  Stress: Not on file  Social Connections: Not on file    Hospital Course:   45 year old male presenting with worsening anxiety, depression, and suicidal ideations with plan to cut himself. While on the unit he expressed some night time anxiety and panic like symptoms. He was started on Propranolol and titrated to 30 mg  QHS. He was also given Vistaril 100 mg at bedtime. All other home medications were continued. During hospital stay anxiety improved as well as mood. He no longer endorses SI/HI/AH/VH. He did express a desire to find a new group home. Guardian notified and is working with [name] ACT Team to start a new search. For now, patient will return to his previous group home.   Physical Findings: AIMS: Facial and Oral Movements Muscles of Facial Expression:  None, normal Lips and Perioral Area: None, normal Jaw: None, normal Tongue: None, normal,Extremity Movements Upper (arms, wrists, hands, fingers): None, normal Lower (legs, knees, ankles, toes): None, normal, Trunk Movements Neck, shoulders, hips: None, normal, Overall Severity Severity of abnormal movements (highest score from questions above): None, normal Incapacitation due to abnormal movements: None, normal Patient's awareness of abnormal movements (rate only patient's report): No Awareness, Dental Status Current problems with teeth and/or dentures?: No Does patient usually wear dentures?: No  CIWA:    COWS:     Musculoskeletal: Strength & Muscle Tone: within normal limits Gait & Station: normal Patient leans: N/A   Psychiatric Specialty Exam: General Appearance: Casual  Eye Contact::  Good  Speech:  Clear and Coherent and Normal Rate  Volume:  Normal  Mood:  Anxious  Affect:  Congruent  Thought Process:  Coherent and Linear  Orientation:  Full (Time, Place, and Person)  Thought Content:  Logical  Suicidal Thoughts:  No  Homicidal Thoughts:  No  Memory:  Immediate;   Fair Recent;   Fair Remote;   Fair  Judgement:  Intact  Insight:  Present  Psychomotor Activity:  Normal  Concentration:  Fair  Recall:  [employer] of Knowledge:Fair  Language: Fair  Akathisia:  Negative  Handed:  Right  AIMS (if indicated):     Assets:  Communication Skills Desire for Improvement Financial Resources/Insurance Housing Physical Health Resilience Social Support  Sleep:  Number of Hours: 8  Cognition: WNL  ADL's:  Intact     Physical Exam: Physical Exam Vitals and nursing note reviewed.  Constitutional:      Appearance: Normal appearance.  HENT:     Head: Normocephalic and atraumatic.     Right Ear: External ear normal.     Left Ear: External ear normal.     Nose: Nose normal.     Mouth/Throat:     Mouth: Mucous membranes are moist.     Pharynx: Oropharynx is  clear.  Eyes:     Extraocular Movements: Extraocular movements intact.     Conjunctiva/sclera: Conjunctivae normal.     Pupils: Pupils are equal, round, and reactive to light.  Cardiovascular:     Rate and Rhythm: Normal rate.     Pulses: Normal pulses.  Pulmonary:     Effort: Pulmonary effort is normal.     Breath sounds: Normal breath sounds.  Abdominal:     General: Abdomen is flat.     Palpations: Abdomen is soft.  Musculoskeletal:        General: No swelling. Normal range of motion.     Cervical back: Normal range of motion and neck supple.  Skin:    General: Skin is warm and dry.  Neurological:     General: No focal deficit present.     Mental Status: He is alert and oriented to person, place, and time.  Psychiatric:        Mood and Affect: Mood normal.        Behavior: Behavior normal.  Thought Content: Thought content normal.        Judgment: Judgment normal.   Review of Systems  Constitutional: Negative.   HENT: Negative.   Eyes: Negative.   Respiratory: Negative.   Cardiovascular: Negative.   Gastrointestinal: Negative.   Endocrine: Negative.   Genitourinary: Negative.   Musculoskeletal: Negative.   Skin: Negative.   Allergic/Immunologic: Positive for environmental allergies. Negative for food allergies.  Neurological: Negative.   Hematological: Negative.   Psychiatric/Behavioral: Negative for behavioral problems, confusion, hallucinations, sleep disturbance and suicidal ideas. The patient is nervous/anxious.    Blood pressure 111/68, pulse (!) 56, temperature 98 F (36.7 C), temperature source Oral, resp. rate 18, height 5\' 10"  (1.778 m), weight 82.6 kg, SpO2 99 %. Body mass index is 26.11 kg/m.   Have you used any form of tobacco in the last 30 days? (Cigarettes, Smokeless Tobacco, Cigars, and/or Pipes): Yes  Has this patient used any form of tobacco in the last 30 days? (Cigarettes, Smokeless Tobacco, Cigars, and/or Pipes)  Yes, A prescription for  an FDA-approved tobacco cessation medication was offered at discharge and the patient refused  Blood Alcohol level:  Lab Results  Component Value Date   [facility] <10 09/19/2020   ETH <10 07/14/2020    Metabolic Disorder Labs:  Lab Results  Component Value Date   HGBA1C 5.1 09/20/2020   MPG 99.67 09/20/2020   MPG 105.41 07/16/2020   Lab Results  Component Value Date   PROLACTIN 2.0 (L) 08/31/2016   PROLACTIN 4.8 05/22/2016   Lab Results  Component Value Date   CHOL 157 09/20/2020   TRIG 447 (H) 09/20/2020   HDL 32 (L) 09/20/2020   CHOLHDL 4.9 09/20/2020   VLDL UNABLE TO CALCULATE IF TRIGLYCERIDE OVER 400 mg/dL 11/20/2020   LDLCALC UNABLE TO CALCULATE IF TRIGLYCERIDE OVER 400 mg/dL 68/08/2120   LDLCALC 53 07/16/2020    See Psychiatric Specialty Exam and Suicide Risk Assessment completed by Attending Physician prior to discharge.  Discharge destination:  Other:  group home  Is patient on multiple antipsychotic therapies at discharge:  Yes,   Do you recommend tapering to monotherapy for antipsychotics?  No   Has Patient had three or more failed trials of antipsychotic monotherapy by history:  Yes,   Antipsychotic medications that previously failed include:   1.  Clozaril., 2.  Zyprexa. and 3.  Saphris.4. Geodon  Recommended Plan for Multiple Antipsychotic Therapies: NA  Discharge Instructions    Diet Carb Modified   Complete by: As directed    Increase activity slowly   Complete by: As directed      Allergies as of 09/26/2020      Reactions   Penicillins Other (See Comments)   Reaction: "lockjaw" Has patient had a PCN reaction causing immediate rash, facial/tongue/throat swelling, SOB or lightheadedness with hypotension: No Has patient had a PCN reaction causing severe rash involving mucus membranes or skin necrosis: No Has patient had a PCN reaction that required hospitalization: No Has patient had a PCN reaction occurring within the last 10 years: No If all of the  above answers are "NO", then may proceed with Cephalosporin use.      Medication List    TAKE these medications     Indication  albuterol 108 (90 Base) MCG/ACT inhaler Commonly known as: VENTOLIN HFA Inhale 1-2 puffs into the lungs every 6 (six) hours as needed for wheezing or shortness of breath.  Indication: Asthma   apixaban 5 MG Tabs tablet Commonly known as: ELIQUIS Take  1 tablet (5 mg total) by mouth 2 (two) times daily.  Indication: Blockage of Blood Vessel to Lung by a Particle   cloZAPine 100 MG tablet Commonly known as: CLOZARIL Take 5 tablets (500 mg total) by mouth at bedtime.  Indication: Schizophrenia   Fish Oil 1000 MG Caps Take 1 capsule by mouth daily.  Indication: CORONARY ARTERY DISEASE   FORA V30a Blood Glucose Test test strip Generic drug: glucose blood Use as instructed  Indication: Diabetes   hydrOXYzine 50 MG tablet Commonly known as: ATARAX/VISTARIL Take 2 tablets (100 mg total) by mouth at bedtime.  Indication: Feeling Anxious   Levemir FlexTouch 100 UNIT/ML FlexPen Generic drug: insulin detemir Inject 10 Units into the skin 2 (two) times daily.  Indication: Type 2 Diabetes   lisinopril 2.5 MG tablet Commonly known as: ZESTRIL Take 1 tablet (2.5 mg total) by mouth daily.  Indication: High Blood Pressure Disorder   lithium carbonate 450 MG CR tablet Commonly known as: ESKALITH Take 1 tablet (450 mg total) by mouth every 12 (twelve) hours.  Indication: Schizoaffective Disorder   metFORMIN 1000 MG tablet Commonly known as: GLUCOPHAGE Take 1 tablet (1,000 mg total) by mouth 2 (two) times daily with a meal.  Indication: Type 2 Diabetes   NovoFine Autocover Pen Needle 30G X 8 MM Misc Generic drug: Insulin Pen Needle  Indication: glucose monitoring   OLANZapine 20 MG tablet Commonly known as: ZYPREXA Take 20 mg by mouth at bedtime.  Indication: Depressive Phase of Manic-Depression   propranolol 10 MG tablet Commonly known as:  INDERAL Take 3 tablets (30 mg total) by mouth at bedtime.  Indication: Feeling Anxious   sertraline 50 MG tablet Commonly known as: ZOLOFT Take 1 tablet (50 mg total) by mouth daily.  Indication: Major Depressive Disorder, Obsessive Compulsive Disorder   simvastatin 40 MG tablet Commonly known as: ZOCOR Take 1 tablet (40 mg total) by mouth daily at 6 PM.  Indication: Nonfamilial Hypercholesterolemia   traZODone 100 MG tablet Commonly known as: DESYREL Take 1 tablet (100 mg total) by mouth at bedtime.  Indication: Trouble Sleeping       Follow-up Information    [facility] & [place], [employer]. Follow up.   Why: Please meet with your established ACTT representative on friday, 27 Sep 2020. The ACTT physician will meet with you on 30 Sep 2020.  Contact information: [address] [place] [address] [phone]               Follow-up recommendations:  Activity:  as tolerated Diet:  carb modified diet  Comments:  Scripts for Propranolol and Vistaril [facility] request.   Signed: [name], MD 09/26/2020, 9:01 AM

## 2020-09-26 NOTE — Progress Notes (Signed)
Patient discharged with follow up informations. Verbalized understanding of discharge information. Denied SI/HI. All belongings returned.

## 2020-09-26 NOTE — Plan of Care (Signed)
Patient is got up and ate breakfast and received AM medications. Alert and oriented and denying SI/HI/AVH. CBG 115 this morning: no insulin given. Patient to be discharged back to the group home today.

## 2020-09-26 NOTE — Progress Notes (Signed)
Patient pleasant. Keeps to himself mostly, but will engage when approached.  Reports doing better but continues to endorse anxiety and depression. Patient is med complaint and tolerated his meds without incident.  Denies SI  HI  AVH. Monitored with Q15 minute safety checks and encouraged to come to staff with any concerns.     Cleo Butler-Nicholson, LPN

## 2020-09-26 NOTE — Progress Notes (Signed)
Recreation Therapy Notes  INPATIENT RECREATION TR PLAN  Patient Details Name: Lance [name] MRN: [id number] DOB: 11/08/1975 Today's Date: 09/26/2020  Rec Therapy Plan Is patient appropriate for Therapeutic Recreation?: Yes Treatment times per week: at least 3 Estimated Length of Stay: 5-7 days TR Treatment/Interventions: Group participation (Comment)  Discharge Criteria Pt will be discharged from therapy if:: Discharged Treatment plan/goals/alternatives discussed and agreed upon by:: Patient/family  Discharge Summary Short term goals set: Patient will identify 3 new relaxation techniques to better manage anxiety within 5 recreation therapy group sessions Short term goals met: Not met Reason goals not met: Patient did not attend any groups Therapeutic equipment acquired: N/A Reason patient discharged from therapy: Discharge from hospital Pt/family agrees with progress & goals achieved: Yes Date patient discharged from therapy: 09/26/20      09/26/2020, 1:43 PM  

## 2020-09-26 NOTE — Progress Notes (Signed)
CBG: 115 

## 2020-09-26 NOTE — Plan of Care (Signed)
  Problem: Anxiety Goal: STG - Patient will identify 3 new relaxation techniques to better manage anxiety within 5 recreation therapy group sessions Description: STG - Patient will identify 3 new relaxation techniques to better manage anxiety within 5 recreation therapy group sessions 09/26/2020 1341 by Ernest Haber, LRT Outcome: Not Applicable [id number] by Ernest Haber, LRT Outcome: Not Met (add Reason) Note: Patient did not attend any groups.

## 2020-09-26 NOTE — [facility] Suicide Risk Assessment (Signed)
[facility] Discharge Suicide Risk Assessment   Principal Problem: Schizoaffective disorder, depressive type (HCC) Discharge Diagnoses: Principal Problem:   Schizoaffective disorder, depressive type (HCC) Active Problems:   Hypertension   Diabetes mellitus without complication (HCC)   Hyperlipidemia   Chronic anticoagulation   Tobacco use disorder   Total Time spent with patient: 35 minutes- 25 minutes face-to-face contact with patient, 10 minutes documentation, coordination of care, scripts   Musculoskeletal: Strength & Muscle Tone: within normal limits Gait & Station: normal Patient leans: N/A  Psychiatric Specialty Exam: Review of Systems  Constitutional: Negative.   HENT: Negative.   Eyes: Negative.   Respiratory: Negative.   Cardiovascular: Negative.   Gastrointestinal: Negative.   Endocrine: Negative.   Genitourinary: Negative.   Musculoskeletal: Negative.   Skin: Negative.   Allergic/Immunologic: Positive for environmental allergies. Negative for food allergies.  Neurological: Negative.   Hematological: Negative.   Psychiatric/Behavioral: Negative for behavioral problems, confusion, hallucinations, sleep disturbance and suicidal ideas. The patient is nervous/anxious.     Blood pressure 111/68, pulse (!) 56, temperature 98 F (36.7 C), temperature source Oral, resp. rate 18, height 5\' 10"  (1.778 m), weight 82.6 kg, SpO2 99 %.Body mass index is 26.11 kg/m.  General Appearance: Casual  Eye Contact::  Good  Speech:  Clear and Coherent and Normal Rate  Volume:  Normal  Mood:  Anxious  Affect:  Congruent  Thought Process:  Coherent and Linear  Orientation:  Full (Time, Place, and Person)  Thought Content:  Logical  Suicidal Thoughts:  No  Homicidal Thoughts:  No  Memory:  Immediate;   Fair Recent;   Fair Remote;   Fair  Judgement:  Intact  Insight:  Present  Psychomotor Activity:  Normal  Concentration:  Fair  Recall:  [id number] of Knowledge:Fair  Language:  Fair  Akathisia:  Negative  Handed:  Right  AIMS (if indicated):     Assets:  Communication Skills Desire for Improvement Financial Resources/Insurance Housing Physical Health Resilience Social Support  Sleep:  Number of Hours: 8  Cognition: WNL  ADL's:  Intact   Mental Status Per Nursing Assessment::   On Admission:  Suicidal ideation indicated by patient  Demographic Factors:  Male and Caucasian  Loss Factors: NA  Historical Factors: NA  Risk Reduction Factors:   Sense of responsibility to family, Living with another person, especially a relative, Positive social support, Positive therapeutic relationship and Positive coping skills or problem solving skills  Continued Clinical Symptoms:  Schizophrenia:   Paranoid or undifferentiated type Previous Psychiatric Diagnoses and Treatments Medical Diagnoses and Treatments/Surgeries  Cognitive Features That Contribute To Risk:  None    Suicide Risk:  Minimal: No identifiable suicidal ideation.  Patients presenting with no risk factors but with morbid ruminations; may be classified as minimal risk based on the severity of the depressive symptoms   Follow-up Information    [phone] [facility], [place]. Follow up.   Why: Please meet with your established ACTT representative on friday, 27 Sep 2020. The ACTT physician will meet with you on 30 Sep 2020.  Contact information: [address] [place] [phone]               Plan Of Care/Follow-up recommendations:  Activity:  as tolerated Diet:  carb modified diet  [phone], MD 09/26/2020, 8:55 AM

## 2020-09-26 NOTE — Progress Notes (Signed)
  [facility] Adult Case Management Discharge Plan :  Will you be returning to the same living situation after discharge:  Yes,  pt will be returning to his group home. At discharge, do you have transportation home?: Yes,  group home to provide transportation.  Do you have the ability to pay for your medications: Yes,  Medicare Part A and B.  Release of information consent forms completed and in the chart;  Patient's signature needed at discharge.  Patient to Follow up at:  Follow-up Information    [address] [facility] & [place], [employer]. Follow up.   Why: Please meet with your established ACTT representative on friday, 27 Sep 2020. The ACTT physician will meet with you on 30 Sep 2020.  Contact information: [address] [place] [address] [place] [address] [phone]               Next level of care provider has access to [facility] Link:no  Safety Planning and Suicide Prevention discussed: Yes,  SPE completed with guardian, [name].  Have you used any form of tobacco in the last 30 days? (Cigarettes, Smokeless Tobacco, Cigars, and/or Pipes): Yes  Has patient been referred to the Quitline?: Patient refused referral  Patient has been referred for addiction treatment: N/A  Glenis Smoker, LCSW 09/26/2020, 10:02 AM

## 2020-09-27 ENCOUNTER — Telehealth: Payer: Self-pay | Admitting: *Deleted

## 2020-09-27 NOTE — Telephone Encounter (Signed)
Transition Care Management Unsuccessful Follow-up Telephone Call  Date of discharge and from where:  09/26/2020 [facility]  Attempts:  1st Attempt  Reason for unsuccessful TCM follow-up call:  Unable to reach patient

## 2020-09-30 NOTE — Telephone Encounter (Signed)
Transition Care Management Unsuccessful Follow-up Telephone Call  Date of discharge and from where:  09/26/2020 [facility]  Attempts:  2nd Attempt  Reason for unsuccessful TCM follow-up call:  Unable to reach patient

## 2020-10-01 NOTE — Telephone Encounter (Signed)
Transition Care Management Unsuccessful Follow-up Telephone Call  Date of discharge and from where:  09/26/2020 [facility]  Attempts:  3rd Attempt  Reason for unsuccessful TCM follow-up call:  Unable to reach patient

## 2020-10-24 ENCOUNTER — Other Ambulatory Visit: Payer: Self-pay | Admitting: Psychiatry

## 2020-11-09 ENCOUNTER — Encounter: Payer: Self-pay | Admitting: Emergency Medicine

## 2020-11-09 ENCOUNTER — Emergency Department
Admission: EM | Admit: 2020-11-09 | Discharge: 2020-11-11 | Disposition: A | Discharge status: Other Acute Inpatient Hospital | Payer: Medicare Other | Attending: Emergency Medicine | Admitting: Emergency Medicine

## 2020-11-09 ENCOUNTER — Other Ambulatory Visit: Payer: Self-pay

## 2020-11-09 DIAGNOSIS — F23 Brief psychotic disorder: Secondary | ICD-10-CM

## 2020-11-09 DIAGNOSIS — F17210 Nicotine dependence, cigarettes, uncomplicated: Secondary | ICD-10-CM | POA: Insufficient documentation

## 2020-11-09 DIAGNOSIS — I10 Essential (primary) hypertension: Secondary | ICD-10-CM | POA: Diagnosis not present

## 2020-11-09 DIAGNOSIS — R45850 Homicidal ideations: Secondary | ICD-10-CM | POA: Insufficient documentation

## 2020-11-09 DIAGNOSIS — Z7901 Long term (current) use of anticoagulants: Secondary | ICD-10-CM | POA: Diagnosis not present

## 2020-11-09 DIAGNOSIS — F319 Bipolar disorder, unspecified: Secondary | ICD-10-CM | POA: Diagnosis present

## 2020-11-09 DIAGNOSIS — F250 Schizoaffective disorder, bipolar type: Secondary | ICD-10-CM | POA: Diagnosis not present

## 2020-11-09 DIAGNOSIS — Z7984 Long term (current) use of oral hypoglycemic drugs: Secondary | ICD-10-CM | POA: Insufficient documentation

## 2020-11-09 DIAGNOSIS — F209 Schizophrenia, unspecified: Secondary | ICD-10-CM

## 2020-11-09 DIAGNOSIS — R443 Hallucinations, unspecified: Secondary | ICD-10-CM

## 2020-11-09 DIAGNOSIS — E119 Type 2 diabetes mellitus without complications: Secondary | ICD-10-CM | POA: Diagnosis not present

## 2020-11-09 DIAGNOSIS — Z20822 Contact with and (suspected) exposure to covid-19: Secondary | ICD-10-CM | POA: Insufficient documentation

## 2020-11-09 DIAGNOSIS — Z79899 Other long term (current) drug therapy: Secondary | ICD-10-CM | POA: Diagnosis not present

## 2020-11-09 LAB — URINE DRUG SCREEN, QUALITATIVE (ARMC ONLY)
Amphetamines, Ur Screen: NOT DETECTED
Barbiturates, Ur Screen: NOT DETECTED
Benzodiazepine, Ur Scrn: NOT DETECTED
Cannabinoid 50 Ng, Ur ~~LOC~~: NOT DETECTED
Cocaine Metabolite,Ur ~~LOC~~: NOT DETECTED
MDMA (Ecstasy)Ur Screen: NOT DETECTED
Methadone Scn, Ur: NOT DETECTED
Opiate, Ur Screen: NOT DETECTED
Phencyclidine (PCP) Ur S: NOT DETECTED
Tricyclic, Ur Screen: POSITIVE — AB

## 2020-11-09 LAB — COMPREHENSIVE METABOLIC PANEL
ALT: 21 U/L (ref 0–44)
AST: 28 U/L (ref 15–41)
Albumin: 4.7 g/dL (ref 3.5–5.0)
Alkaline Phosphatase: 76 U/L (ref 38–126)
Anion gap: 10 (ref 5–15)
BUN: 18 mg/dL (ref 6–20)
CO2: 21 mmol/L — ABNORMAL LOW (ref 22–32)
Calcium: 9.9 mg/dL (ref 8.9–10.3)
Chloride: 109 mmol/L (ref 98–111)
Creatinine, Ser: 1.22 mg/dL (ref 0.61–1.24)
GFR, Estimated: >60 mL/min (ref >60)
Glucose, Bld: 102 mg/dL — ABNORMAL HIGH (ref 70–99)
Potassium: 4.6 mmol/L (ref 3.5–5.1)
Sodium: 140 mmol/L (ref 135–145)
Total Bilirubin: 1 mg/dL (ref 0.3–1.2)
Total Protein: 7.4 g/dL (ref 6.5–8.1)

## 2020-11-09 LAB — CBC WITH DIFFERENTIAL/PLATELET
Abs Immature Granulocytes: 0.05 10*3/uL (ref 0.00–0.07)
Basophils Absolute: 0 10*3/uL (ref 0.0–0.1)
Basophils Relative: 0 %
Eosinophils Absolute: 0 10*3/uL (ref 0.0–0.5)
Eosinophils Relative: 0 %
HCT: 38.5 % — ABNORMAL LOW (ref 39.0–52.0)
Hemoglobin: 13.6 g/dL (ref 13.0–17.0)
Immature Granulocytes: 1 %
Lymphocytes Relative: 24 %
Lymphs Abs: 2 10*3/uL (ref 0.7–4.0)
MCH: 32.4 pg (ref 26.0–34.0)
MCHC: 35.3 g/dL (ref 30.0–36.0)
MCV: 91.7 fL (ref 80.0–100.0)
Monocytes Absolute: 1.1 10*3/uL — ABNORMAL HIGH (ref 0.1–1.0)
Monocytes Relative: 13 %
Neutro Abs: 5.3 10*3/uL (ref 1.7–7.7)
Neutrophils Relative %: 62 %
Platelets: 235 10*3/uL (ref 150–400)
RBC: 4.2 MIL/uL — ABNORMAL LOW (ref 4.22–5.81)
RDW: 12.4 % (ref 11.5–15.5)
WBC: 8.4 10*3/uL (ref 4.0–10.5)
nRBC: 0 % (ref 0.0–0.2)

## 2020-11-09 LAB — CBC
HCT: 43.4 % (ref 39.0–52.0)
Hemoglobin: 15.1 g/dL (ref 13.0–17.0)
MCH: 32.2 pg (ref 26.0–34.0)
MCHC: 34.8 g/dL (ref 30.0–36.0)
MCV: 92.5 fL (ref 80.0–100.0)
Platelets: 270 10*3/uL (ref 150–400)
RBC: 4.69 MIL/uL (ref 4.22–5.81)
RDW: 12.5 % (ref 11.5–15.5)
WBC: 7.7 10*3/uL (ref 4.0–10.5)
nRBC: 0 % (ref 0.0–0.2)

## 2020-11-09 LAB — ETHANOL: Alcohol, Ethyl (B): <10 mg/dL (ref <10)

## 2020-11-09 LAB — LITHIUM LEVEL: Lithium Lvl: 1.42 mmol/L — ABNORMAL HIGH (ref 0.60–1.20)

## 2020-11-09 LAB — RESP PANEL BY RT-PCR (FLU A&B, COVID) ARPGX2
Influenza A by PCR: NEGATIVE
Influenza B by PCR: NEGATIVE
SARS Coronavirus 2 by RT PCR: NEGATIVE

## 2020-11-09 LAB — SALICYLATE LEVEL: Salicylate Lvl: <7 mg/dL — ABNORMAL LOW (ref 7.0–30.0)

## 2020-11-09 LAB — ACETAMINOPHEN LEVEL: Acetaminophen (Tylenol), Serum: <10 ug/mL — ABNORMAL LOW (ref 10–30)

## 2020-11-09 MED ORDER — APIXABAN 5 MG PO TABS
5.0000 mg | ORAL_TABLET | Freq: Two times a day (BID) | ORAL | Status: DC
  Administered 2020-11-10 (×3): 5 mg via ORAL
  Filled 2020-11-09 (×4): qty 1

## 2020-11-09 MED ORDER — SERTRALINE HCL 50 MG PO TABS
50.0000 mg | ORAL_TABLET | Freq: Every day | ORAL | Status: DC
Start: 2020-11-10 — End: 2020-11-11
  Administered 2020-11-10: 50 mg via ORAL
  Filled 2020-11-09: qty 1

## 2020-11-09 MED ORDER — METFORMIN HCL 500 MG PO TABS
1000.0000 mg | ORAL_TABLET | Freq: Two times a day (BID) | ORAL | Status: DC
  Administered 2020-11-10: 1000 mg via ORAL
  Filled 2020-11-09: qty 2

## 2020-11-09 MED ORDER — CLOZAPINE 100 MG PO TABS
500.0000 mg | ORAL_TABLET | Freq: Every day | ORAL | Status: DC
  Administered 2020-11-10 (×2): 500 mg via ORAL
  Filled 2020-11-09 (×3): qty 5

## 2020-11-09 MED ORDER — ALBUTEROL SULFATE HFA 108 (90 BASE) MCG/ACT IN AERS
1.0000 | INHALATION_SPRAY | Freq: Four times a day (QID) | RESPIRATORY_TRACT | Status: DC | PRN
  Filled 2020-11-09: qty 6.7

## 2020-11-09 MED ORDER — OLANZAPINE 10 MG PO TABS
20.0000 mg | ORAL_TABLET | Freq: Every day | ORAL | Status: DC
  Administered 2020-11-10 (×2): 20 mg via ORAL
  Filled 2020-11-09 (×2): qty 2

## 2020-11-09 MED ORDER — LACTATED RINGERS IV BOLUS
1000.0000 mL | Freq: Once | INTRAVENOUS | Status: AC
  Administered 2020-11-10: 1000 mL via INTRAVENOUS

## 2020-11-09 MED ORDER — INSULIN DETEMIR 100 UNIT/ML ~~LOC~~ SOLN
10.0000 [IU] | Freq: Two times a day (BID) | SUBCUTANEOUS | Status: DC
  Filled 2020-11-09 (×4): qty 0.1

## 2020-11-09 MED ORDER — SIMVASTATIN 40 MG PO TABS
40.0000 mg | ORAL_TABLET | Freq: Every day | ORAL | Status: DC
  Administered 2020-11-10: 40 mg via ORAL
  Filled 2020-11-09: qty 1

## 2020-11-09 MED ORDER — LISINOPRIL 5 MG PO TABS
2.5000 mg | ORAL_TABLET | Freq: Every day | ORAL | Status: DC
  Administered 2020-11-10: 2.5 mg via ORAL
  Filled 2020-11-09: qty 1

## 2020-11-09 MED ORDER — INSULIN ASPART 100 UNIT/ML IJ SOLN
0.0000 [IU] | INTRAMUSCULAR | Status: DC
  Administered 2020-11-10 (×2): 2 [IU] via SUBCUTANEOUS
  Filled 2020-11-09 (×2): qty 1

## 2020-11-09 MED ORDER — PROPRANOLOL HCL 10 MG PO TABS
30.0000 mg | ORAL_TABLET | Freq: Every day | ORAL | Status: DC
Start: 2020-11-09 — End: 2020-11-11
  Administered 2020-11-10 (×2): 30 mg via ORAL
  Filled 2020-11-09: qty 2
  Filled 2020-11-09: qty 3

## 2020-11-09 NOTE — ED Notes (Signed)
Pt given something to drink per request.

## 2020-11-09 NOTE — Progress Notes (Signed)
CLOZAPINE MONITORING (reflects NEW REMS GUIDELINES EFFECTIVE 03/26/2014):  Check ANC at least weekly while inpatient.  For general population patients, i.e., those without benign ethnic neutropenia (BEN): --If ANC 1000-1499, increase ANC monitoring to 3x/wk --If ANC < 1000, HOLD CLOZAPINE and get psych consult   For patients with BEN: --If ANC 500-999, increase ANC monitoring to 3x/wk --If ANC < 500, HOLD CLOZAPINE and get psych consult  REMS-certified psychiatry provider can continue drug with ANC below cited thresholds if they document medical opinion that the neutropenia is not clozapine-induced (heme consult is recommended) or that risk of interrupting therapy is greater than the risk of developing severe neutropenia.  --SEE PRESCRIBING INFORMATION FOR ADDITIONAL DETAILS  5/28:  ANC = 5300  Pt is registered with Clozaril REMS program.    - Could not "obtain RDA" due to pt not "having a current Patient Status Form (PSF) on file"   - ANC is WNL so will dispense clozaril on 5/28   - Will monitor ANC weekly per [facility] - Next CBC with diff on 6/04 @ 1200.

## 2020-11-09 NOTE — BH Assessment (Signed)
Comprehensive Clinical Assessment (CCA) Note  11/09/2020 Lance [name] [id number]  Chief Complaint:  Chief Complaint  Patient presents with  . Homicidal  . Manic Behavior   Mr. Lance PasseyBrown arrived to the ED by walking. He reports that "My thoughts were getting screwed up, I felt like stabbing someone at the group home. I have been feeling like that for a while. I just can't get my thoughts together.  Sometimes I feel like hurting myself. I feel like that today.  He shared that he has thoughts of taking an overdose of pills. [facility] He reports that his legal Guardian is Victory DakinVivian Harris. He shared that he is "unsure" if he is hallucinating. "I can see what is here, and then sometimes I can see other people" He reports that he has been missing doses of his medications.  He has been placed on a new anti-anxiety medication, recently.  He reports racing and abstract thoughts. "Sometimes I think about things that don't even make sense. Mr. Lance PasseyBrown reports the inability to identify his feelings at this time.   He shared that he believes that he has been abducted by aliens, but thinks that they may be coming for him at night when he is sleeping.  He admits to having difficulty falling asleep and with staying asleep. He further questioned the assessor about the mark of the beast, and how someone would know if they were marked by the mark of the beast, and if it was permanent.  Patient reports a history of schizophrenia.  Visit Diagnosis: Schizophrenia   CCA Screening, Triage and Referral (STR)  Patient Reported Information How did you hear about us? Self  Referral name: Melinda CrutchJames Liggins  Referral phone number: No data recorded  Whom do you see for routine medical problems? Primary Care  Practice/Facility Name: Dr Halford DecampJavid Masoud  Practice/Facility Phone Number: No data recorded Name of Contact: Dr. Sheppard PlumberMasoud  Contact Number: No data recorded Contact Fax Number: No data  recorded Prescriber Name: No data recorded Prescriber Address (if known): No data recorded  What Is the Reason for Your Visit/Call Today? No data recorded How Long Has This Been Causing You Problems? > than 6 months  What Do You Feel Would Help You the Most Today? Medication; Therapy   Have You Recently Been in Any Inpatient Treatment (Hospital/Detox/Crisis Center/28-Day Program)? Yes  Name/Location of Program/Hospital:[facility]  How Long Were You There? few days  When Were You Discharged? No data recorded  Have You Ever Received Services From [facility] Before? Yes  Who Do You [facility]? Psychiatric   Have You Recently Had Any Thoughts About Hurting Yourself? Yes  Are You Planning to Commit Suicide/Harm Yourself At This time? No   Have you Recently Had Thoughts About Hurting Someone Karolee Ohslse? Yes  Explanation: "I feel like I want to stab someone at the group home"   Have You Used Any Alcohol or Drugs in the Past 24 Hours? No  How Long Ago Did You Use Drugs or Alcohol? No data recorded What Did You Use and How Much? No data recorded  Do You Currently Have a Therapist/Psychiatrist? Yes  Name of Therapist/Psychiatrist: Dr. Juel BurrowMasoud   Have You Been Recently Discharged From Any Office Practice or Programs? No  Explanation of Discharge From Practice/Program: No data recorded    CCA Screening Triage Referral Assessment Type of Contact: Face-to-Face  Is this Initial or Reassessment? No data recorded Date Telepsych consult ordered in  [facility]:  No data recorded Time Telepsych consult ordered in [facility]:  No data recorded  Patient Reported Information Reviewed? Yes  Patient Left Without Being Seen? No data recorded Reason for Not Completing Assessment: No data recorded  Collateral Involvement: None   Does Patient Have a Court Appointed Legal Guardian? No data recorded Name and Contact of Legal Guardian: [name] [phone]  If Minor and Not Living with Parent(s),  Who has Custody? n/a  Is CPS involved or ever been involved? Never  Is APS involved or ever been involved? Currently   Patient Determined To Be At Risk for Harm To Self or Others Based on Review of Patient Reported Information or Presenting Complaint? Yes, for Harm to Others  Method: Plan with intent and identified person  Availability of Means: No access or NA  Intent: Intends to cause physical harm but not necessarily death  Notification Required: -- (Patient recommended for inpatient)  Additional Information for Danger to Others Potential: Active psychosis  Additional Comments for Danger to Others Potential: No data recorded Are There Guns or Other Weapons in Your Home? No  Types of Guns/Weapons: No data recorded Are These Weapons Safely Secured?                            No data recorded Who Could Verify You Are Able To Have These Secured: No data recorded Do You Have any Outstanding Charges, Pending Court Dates, Parole/Probation? No data recorded Contacted To Inform of Risk of Harm To Self or Others: No data recorded  Location of Assessment: [facility] ED   Does Patient Present under Involuntary Commitment? No  IVC Papers Initial File Date: 09/19/2020   [place] of Residence: [place]   Patient Currently Receiving the Following Services: Individual Therapy; Medication Management   Determination of Need: Emergent (2 hours)   Options For Referral: Inpatient Hospitalization     CCA Biopsychosocial Intake/Chief Complaint:  Homicidal ideation  Current Symptoms/Problems: Patient presents to ED under IVC with SI and plan to cut his wrists   Patient Reported Schizophrenia/Schizoaffective Diagnosis in Past: Yes   Strengths: Patient is able to communicate his needs  Preferences: Unknown  Abilities: Patient is able to communicate his needs   Type of Services Patient Feels are Needed: Inpatient hospitalization   Initial Clinical Notes/Concerns: None   Mental  Health Symptoms Depression:  Change in energy/activity; Difficulty Concentrating   Duration of Depressive symptoms: Greater than two weeks   Mania:  Racing thoughts   Anxiety:   None   Psychosis:  Delusions; Hallucinations   Duration of Psychotic symptoms: Greater than six months   Trauma:  None   Obsessions:  None   Compulsions:  None   Inattention:  None   Hyperactivity/Impulsivity:  N/A   Oppositional/Defiant Behaviors:  None   Emotional Irregularity:  None   Other Mood/Personality Symptoms:  No data recorded   Mental Status Exam Appearance and self-care  Stature:  Average   Weight:  Average weight   Clothing:  Disheveled   Grooming:  Normal   Cosmetic use:  None   Posture/gait:  Slumped   Motor activity:  Not Remarkable   Sensorium  Attention:  Normal   Concentration:  Focuses on irrelevancies   Orientation:  Object; Person; Place; Situation   Recall/memory:  Normal   Affect and Mood  Affect:  Anxious   Mood:  Hopeless; Pessimistic   Relating  Eye contact:  Fleeting   Facial expression:  Depressed   Attitude toward examiner:  Cooperative   Thought and Language  Speech flow: Soft; Articulation error; Flight of Ideas   Thought content:  Delusions   Preoccupation:  None   Hallucinations:  Auditory   Organization:  No data recorded  [employer] of Knowledge:  Fair   Intelligence:  Average   Abstraction:  Abstract   Judgement:  Impaired   Reality Testing:  Distorted   Insight:  Gaps   Decision Making:  Normal   Social Functioning  Social Maturity:  Isolates   Social Judgement:  Normal   Stress  Stressors:  Housing   Coping Ability:  Deficient supports   Skill Deficits:  None   Supports:  Support needed     Religion:    Leisure/Recreation: Leisure / Recreation Do You Have Hobbies?: No  Exercise/Diet: Exercise/Diet Do You Exercise?: No Have You Gained or Lost A Significant Amount of Weight  in the Past Six Months?: No Do You Follow a Special Diet?: No Do You Have Any Trouble Sleeping?: Yes Explanation of Sleeping Difficulties: "I can't sleep good anymore, I sleep very little"   CCA Employment/Education Employment/Work Situation: Employment / Work Situation Employment situation: On disability Why is patient on disability: Mental Health How long has patient been on disability: Unknown What is the longest time patient has a held a job?: Unknown Has patient ever been in the [employer]?: No  Education:     CCA Family/Childhood History Family and Relationship History: Family history Marital status: Single Are you sexually active?:  (Unknown) What is your sexual orientation?: Unknown Does patient have children?: No  Childhood History:  Childhood History Additional childhood history information: None reported Description of patient's relationship with caregiver when they were a child: None reported Patient's description of current relationship with people who raised him/her: None reported How were you disciplined when you got in trouble as a child/adolescent?: None reported Does patient have siblings?: No Did patient suffer any verbal/emotional/physical/sexual abuse as a child?:  (Unknown) Did patient suffer from severe childhood neglect?:  (Unknown) Has patient ever been sexually abused/assaulted/raped as an adolescent or adult?:  (Unknown) Was the patient ever a victim of a crime or a disaster?:  (Unknown) Witnessed domestic violence?:  (Unknown) Has patient been affected by domestic violence as an adult?:  (Unknown)  Child/Adolescent Assessment:     CCA Substance Use Alcohol/Drug Use: Alcohol / Drug Use Pain Medications: See MAR Prescriptions: See MAR Over the Counter: See MAR History of alcohol / drug use?: No history of alcohol / drug abuse                         ASAM's:  Six Dimensions of Multidimensional Assessment  Dimension 1:  Acute  Intoxication and/or Withdrawal Potential:      Dimension 2:  Biomedical Conditions and Complications:      Dimension 3:  Emotional, Behavioral, or Cognitive Conditions and Complications:     Dimension 4:  Readiness to Change:     Dimension 5:  Relapse, Continued use, or Continued Problem Potential:     Dimension 6:  Recovery/Living Environment:     ASAM Severity Score:    ASAM Recommended Level of Treatment:     Substance use Disorder (SUD)    Recommendations for Services/Supports/Treatments:    DSM5 Diagnoses: Patient Active Problem List   Diagnosis Date Noted  . Schizoaffective disorder, depressive type (HCC) 09/20/2020  . Alcohol abuse 04/12/2018  . Tobacco  use disorder 09/01/2016  . Schizoaffective disorder, bipolar type (HCC) 05/22/2016  . Diabetes mellitus without complication (HCC) 05/21/2016  . Hyperlipidemia 05/21/2016  . History of pulmonary embolism 05/21/2016  . Chronic anticoagulation 05/21/2016  . Hypertension 09/25/2015      Justice Deeds, Counselor

## 2020-11-09 NOTE — ED Notes (Signed)
Patient updated on POC. Patient denies needs, questions, or concerns.

## 2020-11-09 NOTE — ED Provider Notes (Signed)
[facility] Emergency Department Provider Note  ____________________________________________   Event Date/Time   First MD Initiated Contact with Patient 11/09/20 2218     (approximate)  I have reviewed the triage vital signs and the nursing notes.   HISTORY  Chief Complaint Homicidal and Manic Behavior   HPI Lance [name] is a 45 y.o. male with a past medical history of DM, HTN, HDL, lupus anticoagulant disorder complicated by PE on Eliquis, depression and schizoaffective disorder and schizophrenia who presents companied by police voluntarily from his group home for assessment of poor sleep and homicidal thoughts towards staff and other group home residents over the last day or so.  Patient states he is feeling his way because he does not want to be there and feels that people there do not want to make.  He denies any SI.  He endorses chronic auditory and visual hallucinations have not changed at all but he is not wish to elaborate on at this time.  He denies any acute physical complaints including headache, earache, sore throat, nausea, vomiting, diarrhea, dysuria, rash or recent injuries or falls.  Denies regular EtOH use or illicit drug use.  No other acute concerns at this time.         Past Medical History:  Diagnosis Date  . Depression   . Diabetes mellitus without complication (HCC)   . Hyperlipidemia   . Hypertension   . Lupus anticoagulant disorder (HCC) 06/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  . PE (pulmonary thromboembolism) (HCC)   . Schizo affective schizophrenia (HCC)   . Supratherapeutic INR 11/19/2016    Patient Active Problem List   Diagnosis Date Noted  . Schizoaffective disorder, depressive type (HCC) 09/20/2020  . Alcohol abuse 04/12/2018  . Tobacco use disorder 09/01/2016  . Schizoaffective disorder, bipolar type (HCC) 05/22/2016  . Diabetes mellitus without complication (HCC) 05/21/2016  . Hyperlipidemia 05/21/2016   . History of pulmonary embolism 05/21/2016  . Chronic anticoagulation 05/21/2016  . Hypertension 09/25/2015    History reviewed. No pertinent surgical history.  Prior to Admission medications   Medication Sig Start Date End Date Taking? Authorizing Provider  albuterol (VENTOLIN HFA) 108 (90 Base) MCG/ACT inhaler Inhale 1-2 puffs into the lungs every 6 (six) hours as needed for wheezing or shortness of breath. 11/14/19   Clapacs, [name], MD  apixaban (ELIQUIS) 5 MG TABS tablet Take 1 tablet (5 mg total) by mouth 2 (two) times daily. 04/29/20   Corky Downs, MD  cloZAPine (CLOZARIL) 100 MG tablet Take 5 tablets (500 mg total) by mouth at bedtime. 07/22/20   [name], MD  glucose blood (FORA V30A BLOOD GLUCOSE TEST) test strip Use as instructed 01/25/20   Corky Downs, MD  hydrOXYzine (ATARAX/VISTARIL) 50 MG tablet Take 2 tablets (100 mg total) by mouth at bedtime. 09/25/20   [name], MD  LEVEMIR FLEXTOUCH 100 UNIT/ML FlexPen Inject 10 Units into the skin 2 (two) times daily. 01/24/20   [provider]  lisinopril (ZESTRIL) 2.5 MG tablet Take 1 tablet (2.5 mg total) by mouth daily. 11/14/19   Clapacs, [name], MD  lithium carbonate (ESKALITH) 450 MG CR tablet Take 1 tablet (450 mg total) by mouth every 12 (twelve) hours. 07/22/20   [name], MD  metFORMIN (GLUCOPHAGE) 1000 MG tablet Take 1 tablet (1,000 mg total) by mouth 2 (two) times daily with a meal. 11/14/19   Clapacs, [name], MD  NOVOFINE AUTOCOVER PEN  NEEDLE 30G X 8 MM MISC  09/11/20   [provider]  OLANZapine (ZYPREXA) 20 MG tablet Take 20 mg by mouth at bedtime. 08/27/20   [provider]  Omega-3 Fatty Acids (FISH OIL) 1000 MG CAPS Take 1 capsule by mouth daily. 08/27/20   [provider]  propranolol (INDERAL) 10 MG tablet Take 3 tablets (30 mg total) by mouth at bedtime. 09/25/20   Jesse SansFreeman, Megan M, MD  sertraline (ZOLOFT) 50 MG tablet Take 1 tablet (50 mg total) by mouth daily. 07/22/20    Jesse SansFreeman, Megan M, MD  simvastatin (ZOCOR) 40 MG tablet Take 1 tablet (40 mg total) by mouth daily at 6 PM. 11/23/19   Corky DownsMasoud, Javed, MD  traZODone (DESYREL) 100 MG tablet Take 1 tablet (100 mg total) by mouth at bedtime. 07/22/20   Jesse SansFreeman, Megan M, MD    Allergies Penicillins  Family History  Problem Relation Age of Onset  . CAD Mother   . CAD Sister     Social History Social History   Tobacco Use  . Smoking status: Current Every Day Smoker    Packs/day: 1.00    Years: 23.00    Pack years: 23.00    Types: Cigarettes  . Smokeless tobacco: Never Used  . Tobacco comment: will provide material  Substance Use Topics  . Alcohol use: No    Comment: occassionally  . Drug use: Not Currently    Types: Marijuana    Comment: Last use 11/29/16    Review of Systems  Review of Systems  Constitutional: Negative for chills and fever.  HENT: Negative for sore throat.   Eyes: Negative for pain.  Respiratory: Negative for cough and stridor.   Cardiovascular: Negative for chest pain.  Gastrointestinal: Negative for vomiting.  Genitourinary: Negative for dysuria.  Musculoskeletal: Negative for myalgias.  Skin: Negative for rash.  Neurological: Negative for seizures, loss of consciousness and headaches.  Psychiatric/Behavioral: Positive for hallucinations. Negative for suicidal ideas. The patient has insomnia.   All other systems reviewed and are negative.     ____________________________________________   PHYSICAL EXAM:  VITAL SIGNS: ED Triage Vitals  Enc Vitals Group     BP 11/09/20 2201 101/75     Pulse Rate 11/09/20 2201 71     Resp 11/09/20 2201 20     Temp 11/09/20 2201 98.4 F (36.9 C)     Temp Source 11/09/20 2201 Oral     SpO2 11/09/20 2201 97 %     Weight 11/09/20 2202 182 lb 1.6 oz (82.6 kg)     Height 11/09/20 2202 5\' 10"  (1.778 m)     Head Circumference --      Peak Flow --      Pain Score 11/09/20 2202 0     Pain Loc --      Pain Edu? --      Excl. in GC?  --    Vitals:   11/09/20 2201  BP: 101/75  Pulse: 71  Resp: 20  Temp: 98.4 F (36.9 C)  SpO2: 97%   Physical Exam Vitals and nursing note reviewed.  Constitutional:      Appearance: He is well-developed.  HENT:     Head: Normocephalic and atraumatic.     Right Ear: External ear normal.     Left Ear: External ear normal.     Nose: Nose normal.  Eyes:     Conjunctiva/sclera: Conjunctivae normal.  Cardiovascular:     Rate and Rhythm: Normal rate and regular  rhythm.     Heart sounds: No murmur heard.   Pulmonary:     Effort: Pulmonary effort is normal. No respiratory distress.     Breath sounds: Normal breath sounds.  Abdominal:     Palpations: Abdomen is soft.     Tenderness: There is no abdominal tenderness.  Musculoskeletal:     Cervical back: Neck supple.     Right lower leg: No edema.     Left lower leg: No edema.  Skin:    General: Skin is warm and dry.     Capillary Refill: Capillary refill takes less than 2 seconds.  Neurological:     Mental Status: He is alert and oriented to person, place, and time.  Psychiatric:        Attention and Perception: He perceives auditory and visual hallucinations.        Thought Content: Thought content includes homicidal ideation. Thought content does not include suicidal ideation.      ____________________________________________   LABS (all labs ordered are listed, but only abnormal results are displayed)  Labs Reviewed  COMPREHENSIVE METABOLIC PANEL - Abnormal; Notable for the following components:      Result Value   CO2 21 (*)    Glucose, Bld 102 (*)    All other components within normal limits  RESP PANEL BY RT-PCR (FLU A&B, COVID) ARPGX2  CBC  ETHANOL  SALICYLATE LEVEL  ACETAMINOPHEN LEVEL  URINE DRUG SCREEN, QUALITATIVE ([facility] ONLY)  LITHIUM LEVEL   ____________________________________________  EKG  ____________________________________________  RADIOLOGY  ED MD interpretation:    Official  radiology report(s): No results found.  ____________________________________________   PROCEDURES  Procedure(s) performed (including Critical Care):  Procedures   ____________________________________________   INITIAL IMPRESSION / ASSESSMENT AND PLAN / ED COURSE      Patient presents with above-stated history exam for assessment of poor sleep stating has not slept in at least 1 night and increasing homicidal thoughts towards other group home residents and staff at his group home.  This in the setting of a history of schizoaffective disorder and schizophrenia with chronic auditory and visual hallucinations.  On arrival patient is afebrile hemodynamically stable.  Overall I have a low suspicion for drug-induced symptoms or other significant organic pathology at this time and certainly possible his underlying psychiatric disorders are being exacerbated by some stressors at his group home.  We will send routine psych screening labs and consult psych and TTS.  Patient is here voluntarily at this time and wishes to seek help will defer IVC at this time.  The patient has been placed in psychiatric observation due to the need to provide a safe environment for the patient while obtaining psychiatric consultation and evaluation, as well as ongoing medical and medication management to treat the patient's condition.  The patient has not been placed under full IVC at this time.       ____________________________________________   FINAL CLINICAL IMPRESSION(S) / ED DIAGNOSES  Final diagnoses:  Homicidal thoughts  Hallucinations    Medications  insulin detemir (LEVEMIR) FlexPen 10 Units (has no administration in time range)  propranolol (INDERAL) tablet 30 mg (has no administration in time range)  simvastatin (ZOCOR) tablet 40 mg (has no administration in time range)  apixaban (ELIQUIS) tablet 5 mg (has no administration in time range)  cloZAPine (CLOZARIL) tablet 500 mg (has no  administration in time range)  insulin aspart (novoLOG) injection 0-15 Units (has no administration in time range)  albuterol (VENTOLIN HFA) 108 (90 Base)  MCG/ACT inhaler 1-2 puff (has no administration in time range)  metFORMIN (GLUCOPHAGE) tablet 1,000 mg (has no administration in time range)  OLANZapine (ZYPREXA) tablet 20 mg (has no administration in time range)  sertraline (ZOLOFT) tablet 50 mg (has no administration in time range)  lisinopril (ZESTRIL) tablet 2.5 mg (has no administration in time range)     ED Discharge Orders    None       Note:  This document was prepared using Dragon voice recognition software and may include unintentional dictation errors.   Gilles Chiquito, MD 11/09/20 2229

## 2020-11-09 NOTE — ED Notes (Signed)
Patient reports homicidal thoughts, intrusive thoughts, and racing thoughts x several days. Patient reports he lives in a group home, and law enforcement was called because the was threatening to stab other group home residents. Patient reports "I can't keep up with my thoughts." Patient denies AVH or SI at this time. Patient does reports thoughts of harming other group home resident, and endorses a plan to "stab them with a knife." Patient is calm, cooperative at this time.

## 2020-11-09 NOTE — Consult Note (Signed)
[facility] Face-to-Face Psychiatry Consult   Reason for Consult:  Psychiatric evaluation Referring Physician:  Dr. Steve Rattler Patient Identification: Lance [name] MRN:  [id number] Principal Diagnosis: <principal problem not specified> Diagnosis:  Active Problems:   * No active hospital problems. *   Total Time spent with patient: 45 minutes  Subjective:   Lance [name] is a 45 y.o. male patient admitted with homicidal thought.  HPI: Assessment  Lance [name], 45 y.o., male patient with a hx of schizophrenia presents to [facility] for disorganized and homicidal thoughts .  Patient seen  by TTS and this provider; chart reviewed and consulted with Dr. Katrinka Blazing on 11/10/20.  On evaluation Lance [name] reports per tts, he reports that "My thoughts were getting screwed up, I felt like stabbing someone at the group home. I have been feeling like that for a while. I just can't get my thoughts together.  Sometimes I feel like hurting myself. I feel like that today.  He shared that he has thoughts of taking an overdose of pills. [facility] He reports that his legal Guardian is [name]. He shared that he is "unsure" if he is hallucinating.  Patient is states that his medication administration is inconsistent. Patient is currently taking clozapine and lithium (will check labs for therapeutic effectiveness).    At this time patient ruminates on stabbing someone in the group home and states he has been feeling like this for atleast a month.    During assessment  the patient endorsed depressed mood, markedly diminished pleasure, increased/decreased appetite, changes on sleep, fatigue and loss of energy, feeling guilty or worthless, decrease concentration, active SI and HI with intention or plan. Patient endorses psychotic symptoms including auditory/visual hallucinations and disorganized thoughts and behaviors.   Recommendations:  Inpatient psychiatric evaluation   Past  Psychiatric History: Schizophrenia  Risk to Self:   Risk to Others:   Prior Inpatient Therapy:   Prior Outpatient Therapy:    Past Medical History:  Past Medical History:  Diagnosis Date  . Depression   . Diabetes mellitus without complication (HCC)   . Hyperlipidemia   . Hypertension   . Lupus anticoagulant disorder (HCC) 06/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  . PE (pulmonary thromboembolism) (HCC)   . Schizo affective schizophrenia (HCC)   . Supratherapeutic INR 11/19/2016   History reviewed. No pertinent surgical history. Family History:  Family History  Problem Relation Age of Onset  . CAD Mother   . CAD Sister    Family Psychiatric  History: unknown Social History:  Social History   Substance and Sexual Activity  Alcohol Use No   Comment: occassionally     Social History   Substance and Sexual Activity  Drug Use Not Currently  . Types: Marijuana   Comment: Last use 11/29/16    Social History   Socioeconomic History  . Marital status: Single    Spouse name: Not on file  . Number of children: Not on file  . Years of education: Not on file  . Highest education level: Not on file  Occupational History  . Not on file  Tobacco Use  . Smoking status: Current Every Day Smoker    Packs/day: 1.00    Years: 23.00    Pack years: 23.00    Types: Cigarettes  . Smokeless tobacco: Never Used  . Tobacco comment: will provide material  Substance and Sexual Activity  . Alcohol use: No  Comment: occassionally  . Drug use: Not Currently    Types: Marijuana    Comment: Last use 11/29/16  . Sexual activity: Never    Birth control/protection: None    Comment: occasional marijuana- none recently  Other Topics Concern  . Not on file  Social History Narrative   From a group home in [place]   Social Determinants of Health   Financial Resource Strain: Not on file  Food Insecurity: Not on file  Transportation Needs: Not on file  Physical Activity:  Not on file  Stress: Not on file  Social Connections: Not on file   Additional Social History:    Allergies:   Allergies  Allergen Reactions  . Penicillins Other (See Comments)    Reaction: "lockjaw" Has patient had a PCN reaction causing immediate rash, facial/tongue/throat swelling, SOB or lightheadedness with hypotension: No Has patient had a PCN reaction causing severe rash involving mucus membranes or skin necrosis: No Has patient had a PCN reaction that required hospitalization: No Has patient had a PCN reaction occurring within the last 10 years: No If all of the above answers are "NO", then may proceed with Cephalosporin use.     Labs:  Results for orders placed or performed during the hospital encounter of 11/09/20 (from the past 48 hour(s))  Comprehensive metabolic panel     Status: Abnormal   Collection Time: 11/09/20 10:06 PM  Result Value Ref Range   Sodium 140 135 - 145 mmol/L   Potassium 4.6 3.5 - 5.1 mmol/L    Comment: HEMOLYSIS AT THIS LEVEL MAY AFFECT RESULT   Chloride 109 98 - 111 mmol/L   CO2 21 (L) 22 - 32 mmol/L   Glucose, Bld 102 (H) 70 - 99 mg/dL    Comment: Glucose reference range applies only to samples taken after fasting for at least 8 hours.   BUN 18 6 - 20 mg/dL   Creatinine, Ser 1.61 0.61 - 1.24 mg/dL   Calcium 9.9 8.9 - 09.6 mg/dL   Total Protein 7.4 6.5 - 8.1 g/dL   Albumin 4.7 3.5 - 5.0 g/dL   AST 28 15 - 41 U/L   ALT 21 0 - 44 U/L   Alkaline Phosphatase 76 38 - 126 U/L   Total Bilirubin 1.0 0.3 - 1.2 mg/dL   GFR, Estimated >04 >54 mL/min    Comment: (NOTE) Calculated using the CKD-EPI Creatinine Equation (2021)    Anion gap 10 5 - 15    Comment: Performed at [facility], [address] [place], [place] [address]  Ethanol     Status: None   Collection Time: 11/09/20 10:06 PM  Result Value Ref Range   Alcohol, Ethyl (B) <10 <10 mg/dL    Comment: (NOTE) Lowest detectable limit for serum alcohol is 10 mg/dL.  For medical  purposes only. Performed at [facility], [address] [place], [place] [address]   Salicylate level     Status: Abnormal   Collection Time: 11/09/20 10:06 PM  Result Value Ref Range   Salicylate Lvl <7.0 (L) 7.0 - 30.0 mg/dL    Comment: Performed at [facility], [address] [place], [place] [address]  Acetaminophen level     Status: Abnormal   Collection Time: 11/09/20 10:06 PM  Result Value Ref Range   Acetaminophen (Tylenol), Serum <10 (L) 10 - 30 ug/mL    Comment: (NOTE) Therapeutic concentrations vary significantly. A range of 10-30 ug/mL  may be an effective concentration for many  patients. However, some  are best treated at concentrations outside of this range. Acetaminophen concentrations >150 ug/mL at 4 hours after ingestion  and >50 ug/mL at 12 hours after ingestion are often associated with  toxic reactions.  Performed at [facility], [address] [place], [place] [address]   cbc     Status: None   Collection Time: 11/09/20 10:06 PM  Result Value Ref Range   WBC 7.7 4.0 - 10.5 K/uL   RBC 4.69 4.22 - 5.81 MIL/uL   Hemoglobin 15.1 13.0 - 17.0 g/dL   HCT 08.1 44.8 - 18.5 %   MCV 92.5 80.0 - 100.0 fL   MCH 32.2 26.0 - 34.0 pg   MCHC 34.8 30.0 - 36.0 g/dL   RDW 63.1 49.7 - 02.6 %   Platelets 270 150 - 400 K/uL   nRBC 0.0 0.0 - 0.2 %    Comment: Performed at [facility], [address] [place], [place] [address]  Lithium level     Status: Abnormal   Collection Time: 11/09/20 10:06 PM  Result Value Ref Range   Lithium Lvl 1.42 (H) 0.60 - 1.20 mmol/L    Comment: Performed at [facility], [address] [place], [place] [address]  Urine Drug Screen, Qualitative     Status: Abnormal   Collection Time: 11/09/20 10:31 PM  Result Value Ref Range   Tricyclic, Ur Screen POSITIVE (A) NONE DETECTED   Amphetamines, Ur Screen NONE DETECTED NONE DETECTED   MDMA (Ecstasy)Ur Screen NONE DETECTED NONE DETECTED    Cocaine Metabolite,Ur [place] NONE DETECTED NONE DETECTED   Opiate, Ur Screen NONE DETECTED NONE DETECTED   Phencyclidine (PCP) Ur S NONE DETECTED NONE DETECTED   Cannabinoid 50 Ng, Ur [place] NONE DETECTED NONE DETECTED   Barbiturates, Ur Screen NONE DETECTED NONE DETECTED   Benzodiazepine, Ur Scrn NONE DETECTED NONE DETECTED   Methadone Scn, Ur NONE DETECTED NONE DETECTED    Comment: (NOTE) Tricyclics + metabolites, urine    Cutoff 1000 ng/mL Amphetamines + metabolites, urine  Cutoff 1000 ng/mL MDMA (Ecstasy), urine              Cutoff 500 ng/mL Cocaine Metabolite, urine          Cutoff 300 ng/mL Opiate + metabolites, urine        Cutoff 300 ng/mL Phencyclidine (PCP), urine         Cutoff 25 ng/mL Cannabinoid, urine                 Cutoff 50 ng/mL Barbiturates + metabolites, urine  Cutoff 200 ng/mL Benzodiazepine, urine              Cutoff 200 ng/mL Methadone, urine                   Cutoff 300 ng/mL  The urine drug screen provides only a preliminary, unconfirmed analytical test result and should not be used for non-medical purposes. Clinical consideration and professional judgment should be applied to any positive drug screen result due to possible interfering substances. A more specific alternate chemical method must be used in order to obtain a confirmed analytical result. Gas chromatography / mass spectrometry (GC/MS) is the preferred confirm atory method. Performed at [facility], [address] [place], [place] [address]   Resp Panel by RT-PCR (Flu A&B, Covid) Urine, Clean Catch     Status: None   Collection Time: 11/09/20 10:31 PM   Specimen: Urine, Clean Catch; Nasopharyngeal(NP)  swabs in vial transport medium  Result Value Ref Range   SARS Coronavirus 2 by RT PCR NEGATIVE NEGATIVE    Comment: (NOTE) SARS-CoV-2 target nucleic acids are NOT DETECTED.  The SARS-CoV-2 RNA is generally detectable in upper respiratory specimens during the acute phase of infection. The  lowest concentration of SARS-CoV-2 viral copies this assay can detect is 138 copies/mL. A negative result does not preclude SARS-Cov-2 infection and should not be used as the sole basis for treatment or other patient management decisions. A negative result may occur with  improper specimen collection/handling, submission of specimen other than nasopharyngeal swab, presence of viral mutation(s) within the areas targeted by this assay, and inadequate number of viral copies(<138 copies/mL). A negative result must be combined with clinical observations, patient history, and epidemiological information. The expected result is Negative.  Fact Sheet for Patients:  BloggerCourse.com  Fact Sheet for Healthcare Providers:  SeriousBroker.it  This test is no t yet approved or cleared by the [place] FDA and  has been authorized for detection and/or diagnosis of SARS-CoV-2 by FDA under an Emergency Use Authorization (EUA). This EUA will remain  in effect (meaning this test can be used) for the duration of the COVID-19 declaration under Section 564(b)(1) of the Act, 21 U.S.C.section 360bbb-3(b)(1), unless the authorization is terminated  or revoked sooner.       Influenza A by PCR NEGATIVE NEGATIVE   Influenza B by PCR NEGATIVE NEGATIVE    Comment: (NOTE) The Xpert Xpress SARS-CoV-2/FLU/RSV plus assay is intended as an aid in the diagnosis of influenza from Nasopharyngeal swab specimens and should not be used as a sole basis for treatment. Nasal washings and aspirates are unacceptable for Xpert Xpress SARS-CoV-2/FLU/RSV testing.  Fact Sheet for Patients: BloggerCourse.com  Fact Sheet for Healthcare Providers: SeriousBroker.it  This test is not yet approved or cleared by the [place] FDA and has been authorized for detection and/or diagnosis of SARS-CoV-2 by FDA under an Emergency  Use Authorization (EUA). This EUA will remain in effect (meaning this test can be used) for the duration of the COVID-19 declaration under Section 564(b)(1) of the Act, 21 U.S.C. section 360bbb-3(b)(1), unless the authorization is terminated or revoked.  Performed at [facility], [address] [place], [place] [address]   CBC with Differential/Platelet     Status: Abnormal   Collection Time: 11/09/20 10:31 PM  Result Value Ref Range   WBC 8.4 4.0 - 10.5 K/uL   RBC 4.20 (L) 4.22 - 5.81 MIL/uL   Hemoglobin 13.6 13.0 - 17.0 g/dL   HCT 59.5 (L) 63.8 - 75.6 %   MCV 91.7 80.0 - 100.0 fL   MCH 32.4 26.0 - 34.0 pg   MCHC 35.3 30.0 - 36.0 g/dL   RDW 43.3 29.5 - 18.8 %   Platelets 235 150 - 400 K/uL   nRBC 0.0 0.0 - 0.2 %   Neutrophils Relative % 62 %   Neutro Abs 5.3 1.7 - 7.7 K/uL   Lymphocytes Relative 24 %   Lymphs Abs 2.0 0.7 - 4.0 K/uL   Monocytes Relative 13 %   Monocytes Absolute 1.1 (H) 0.1 - 1.0 K/uL   Eosinophils Relative 0 %   Eosinophils Absolute 0.0 0.0 - 0.5 K/uL   Basophils Relative 0 %   Basophils Absolute 0.0 0.0 - 0.1 K/uL   Immature Granulocytes 1 %   Abs Immature Granulocytes 0.05 0.00 - 0.07 K/uL    Comment: Performed at [facility], [address] [place], [place] [address]    Current Facility-Administered Medications  Medication Dose Route Frequency Provider Last Rate Last Admin  . albuterol (VENTOLIN HFA) 108 (90 Base) MCG/ACT inhaler 1-2 puff  1-2 puff Inhalation Q6H PRN Gilles Chiquito, MD      . apixaban (ELIQUIS) tablet 5 mg  5 mg Oral BID Gilles Chiquito, MD      . cloZAPine (CLOZARIL) tablet 500 mg  500 mg Oral QHS Gilles Chiquito, MD      . insulin aspart (novoLOG) injection 0-15 Units  0-15 Units Subcutaneous Q4H Gilles Chiquito, MD      . insulin detemir (LEVEMIR) injection 10 Units  10 Units Subcutaneous BID Gilles Chiquito, MD      . lactated ringers bolus 1,000 mL  1,000 mL Intravenous Once Gilles Chiquito, MD       . Melene Muller ON 11/10/2020] lisinopril (ZESTRIL) tablet 2.5 mg  2.5 mg Oral Daily Gilles Chiquito, MD      . Melene Muller ON 11/10/2020] metFORMIN (GLUCOPHAGE) tablet 1,000 mg  1,000 mg Oral BID WC Gilles Chiquito, MD      . OLANZapine (ZYPREXA) tablet 20 mg  20 mg Oral QHS Gilles Chiquito, MD      . propranolol (INDERAL) tablet 30 mg  30 mg Oral QHS Gilles Chiquito, MD      . Melene Muller ON 11/10/2020] sertraline (ZOLOFT) tablet 50 mg  50 mg Oral Daily Gilles Chiquito, MD      . Melene Muller ON 11/10/2020] simvastatin (ZOCOR) tablet 40 mg  40 mg Oral q1800 Gilles Chiquito, MD       Current Outpatient Medications  Medication Sig Dispense Refill  . albuterol (VENTOLIN HFA) 108 (90 Base) MCG/ACT inhaler Inhale 1-2 puffs into the lungs every 6 (six) hours as needed for wheezing or shortness of breath. 18 g 1  . apixaban (ELIQUIS) 5 MG TABS tablet Take 1 tablet (5 mg total) by mouth 2 (two) times daily. 60 tablet 6  . cloZAPine (CLOZARIL) 100 MG tablet Take 5 tablets (500 mg total) by mouth at bedtime. 150 tablet 1  . glucose blood (FORA V30A BLOOD GLUCOSE TEST) test strip Use as instructed 100 each 12  . hydrOXYzine (ATARAX/VISTARIL) 50 MG tablet Take 2 tablets (100 mg total) by mouth at bedtime. 60 tablet 1  . LEVEMIR FLEXTOUCH 100 UNIT/ML FlexPen Inject 10 Units into the skin 2 (two) times daily.    Marland Kitchen lisinopril (ZESTRIL) 2.5 MG tablet Take 1 tablet (2.5 mg total) by mouth daily. 30 tablet 1  . lithium carbonate (ESKALITH) 450 MG CR tablet Take 1 tablet (450 mg total) by mouth every 12 (twelve) hours. 60 tablet 1  . metFORMIN (GLUCOPHAGE) 1000 MG tablet Take 1 tablet (1,000 mg total) by mouth 2 (two) times daily with a meal. 60 tablet 1  . NOVOFINE AUTOCOVER PEN NEEDLE 30G X 8 MM MISC     . OLANZapine (ZYPREXA) 20 MG tablet Take 20 mg by mouth at bedtime.    . Omega-3 Fatty Acids (FISH OIL) 1000 MG CAPS Take 1 capsule by mouth daily.    . propranolol (INDERAL) 10 MG tablet Take 3 tablets (30 mg total) by mouth at  bedtime. 90 tablet 1  . sertraline (ZOLOFT) 50 MG tablet Take 1 tablet (50 mg total) by mouth daily. 30 tablet 1  . simvastatin (ZOCOR) 40 MG tablet Take 1 tablet (40 mg total) by mouth daily at 6 PM. 30 tablet  6  . traZODone (DESYREL) 100 MG tablet Take 1 tablet (100 mg total) by mouth at bedtime. 30 tablet 1    Musculoskeletal: Strength & Muscle Tone: within normal limits Gait & Station: normal Patient leans: N/A  Psychiatric Specialty Exam:  Presentation  General Appearance: Casual  Eye Contact:Fleeting  Speech:Clear and Coherent  Speech Volume:Normal  Handedness:Right   Mood and Affect  Mood:Depressed; Irritable  Affect:Congruent   Thought Process  Thought Processes:Goal Directed  Descriptions of Associations:Intact  Orientation:Full (Time, Place and Person)  Thought Content:Perseveration  History of Schizophrenia/Schizoaffective disorder:Yes  Duration of Psychotic Symptoms:Greater than six months  Hallucinations:No data recorded Ideas of Reference:Paranoia  Suicidal Thoughts:No data recorded Homicidal Thoughts:No data recorded  Sensorium  Memory:Immediate Fair; Remote Fair; Recent Fair  Judgment:Poor  Insight:Shallow   Executive Functions  Concentration:Fair  Attention Span:Fair  Recall:Fair  Fund of Knowledge:Fair  Language:Fair   Psychomotor Activity  Psychomotor Activity:No data recorded  Assets  Assets:Communication Skills; Desire for Improvement; Financial Resources/Insurance; Housing; Resilience   Sleep  Sleep:No data recorded  Physical Exam: Physical Exam Vitals and nursing note reviewed.  HENT:     Head: Normocephalic and atraumatic.     Nose: Nose normal.  Eyes:     Pupils: Pupils are equal, round, and reactive to light.  Pulmonary:     Effort: Pulmonary effort is normal.  Musculoskeletal:        General: Normal range of motion.     Cervical back: Normal range of motion.  Skin:    General: Skin is dry.   Neurological:     General: No focal deficit present.     Mental Status: He is alert and oriented to person, place, and time.  Psychiatric:        Attention and Perception: Attention normal. He perceives auditory hallucinations.        Mood and Affect: Mood is anxious and depressed. Affect is flat.        Speech: Speech normal.        Behavior: Behavior is cooperative.        Thought Content: Thought content includes homicidal and suicidal ideation. Thought content includes homicidal and suicidal plan.        Cognition and Memory: Cognition and memory normal.        Judgment: Judgment is impulsive and inappropriate.    Review of Systems  Psychiatric/Behavioral: Positive for depression, hallucinations and suicidal ideas.  All other systems reviewed and are negative.  Blood pressure 101/75, pulse 71, temperature 98.4 F (36.9 C), temperature source Oral, resp. rate 20, height 5\' 10"  (1.778 m), weight 82.6 kg, SpO2 97 %. Body mass index is 26.13 kg/m.  Treatment Plan Summary: Daily contact with patient to assess and evaluate symptoms and progress in treatment, Medication management and Plan Restart clozaril once appropriate labs are in a justifies continuance   Plan:  Review of chart, vital signs, medications, and notes. 1-Individual and group therapy 2-Medication management for depression and anxiety:  Medications reviewed with the patient and he stated no untoward effects, no changes made 3-Coping skills for depression, anxiety, and PTSD 4-Continue crisis stabilization and management 5-Address health issues--monitoring vital signs, stable 6-Treatment plan in progress to prevent relapse of depression and anxiety  Disposition: Recommend psychiatric Inpatient admission when medically cleared. Supportive therapy provided about ongoing stressors. Discussed crisis plan, support from social network, calling 911, coming to the Emergency Department, and calling Suicide Hotline. Continue  home meds   Jearld LeschRashaun M Nautika Cressey, NP 11/09/2020 11:36 PM

## 2020-11-09 NOTE — ED Notes (Signed)
Pt dressed in burgundy scrubs with myself and Eula Listen and [employer] officer present.Pts belongings, include red backpack, jacket, wet green tshirt, watch, jeans, boxers, socls and sneakers, bagged labelled and placed at nurses station.  Pt ambulated to [facility].  Pt has total of 2 bags

## 2020-11-09 NOTE — ED Triage Notes (Signed)
Pt reports that he has thoughts of harming others (people at his group home) Denies any SI. [employer] brought pt in, she denies any threats or aggressiveness at the group home.

## 2020-11-09 NOTE — ED Notes (Signed)
Patient provided chocolate milk at his request. Patient updated on POC, and provided a pillow and blanket. Patient denies further needs.

## 2020-11-10 DIAGNOSIS — R443 Hallucinations, unspecified: Secondary | ICD-10-CM | POA: Diagnosis not present

## 2020-11-10 DIAGNOSIS — R45850 Homicidal ideations: Secondary | ICD-10-CM | POA: Insufficient documentation

## 2020-11-10 DIAGNOSIS — F209 Schizophrenia, unspecified: Secondary | ICD-10-CM | POA: Diagnosis not present

## 2020-11-10 DIAGNOSIS — F250 Schizoaffective disorder, bipolar type: Secondary | ICD-10-CM

## 2020-11-10 DIAGNOSIS — R45851 Suicidal ideations: Secondary | ICD-10-CM | POA: Diagnosis not present

## 2020-11-10 LAB — CBG MONITORING, ED
Glucose-Capillary: 100 mg/dL — ABNORMAL HIGH (ref 70–99)
Glucose-Capillary: 110 mg/dL — ABNORMAL HIGH (ref 70–99)
Glucose-Capillary: 125 mg/dL — ABNORMAL HIGH (ref 70–99)
Glucose-Capillary: 128 mg/dL — ABNORMAL HIGH (ref 70–99)
Glucose-Capillary: 96 mg/dL (ref 70–99)

## 2020-11-10 LAB — LITHIUM LEVEL
Lithium Lvl: 1.54 mmol/L (ref 0.60–1.20)
Lithium Lvl: 1.3 mmol/L — ABNORMAL HIGH (ref 0.60–1.20)

## 2020-11-10 MED ORDER — LORAZEPAM 2 MG PO TABS: 2.0000 mg | ORAL_TABLET | Freq: Once | ORAL | Status: DC

## 2020-11-10 MED ORDER — LACTATED RINGERS IV BOLUS
1000.0000 mL | Freq: Once | INTRAVENOUS | Status: AC
  Administered 2020-11-10: 1000 mL via INTRAVENOUS

## 2020-11-10 MED ORDER — LORAZEPAM 2 MG/ML IJ SOLN
2.0000 mg | Freq: Once | INTRAMUSCULAR | Status: AC
  Administered 2020-11-10: 2 mg via INTRAMUSCULAR
  Filled 2020-11-10: qty 1

## 2020-11-10 MED ORDER — DIPHENHYDRAMINE HCL 50 MG/ML IJ SOLN
50.0000 mg | Freq: Once | INTRAMUSCULAR | Status: AC
  Administered 2020-11-10: 50 mg via INTRAMUSCULAR
  Filled 2020-11-10: qty 1

## 2020-11-10 MED ORDER — DIPHENHYDRAMINE HCL 25 MG PO CAPS: 50.0000 mg | ORAL_CAPSULE | Freq: Once | ORAL | Status: DC

## 2020-11-10 MED ORDER — HALOPERIDOL 5 MG PO TABS: 5.0000 mg | ORAL_TABLET | Freq: Once | ORAL | Status: DC

## 2020-11-10 MED ORDER — INSULIN GLARGINE-YFGN 100 UNIT/ML ~~LOC~~ SOLN
10.0000 [IU] | Freq: Two times a day (BID) | SUBCUTANEOUS | Status: DC
  Administered 2020-11-10: 10 [IU] via SUBCUTANEOUS
  Filled 2020-11-10 (×3): qty 10

## 2020-11-10 MED ORDER — LACTATED RINGERS IV SOLN: INTRAVENOUS | Status: AC

## 2020-11-10 MED ORDER — HALOPERIDOL LACTATE 5 MG/ML IJ SOLN
5.0000 mg | Freq: Once | INTRAMUSCULAR | Status: AC
  Administered 2020-11-10: 5 mg via INTRAMUSCULAR
  Filled 2020-11-10: qty 1

## 2020-11-10 NOTE — ED Notes (Signed)
Patient being moved to [facility] Room # 320 this evening no time specified

## 2020-11-10 NOTE — ED Provider Notes (Signed)
Lithium level went up after liter bolus.  Patient denies overdose or taking more than prescribed.  If anything he reports that he has been taking very sporadically.  Patient with no clinical signs of lithium toxicity.  Normal kidney function.  Level now is 1.5.  We will give another bolus and put patient on 150 cc of fluid an hour for 4 hours and repeated again at 7 AM.  We will continue to monitor clinically for any signs of toxicity.   Don Perking, [place], MD 11/10/20 [phone]

## 2020-11-10 NOTE — ED Notes (Signed)
PT given dinner tray

## 2020-11-10 NOTE — ED Provider Notes (Signed)
-----------------------------------------   1:52 PM on 11/10/2020 -----------------------------------------  Repeat lithium level is downtrending, patient does not show any signs of lithium toxicity at this time.  He is medically cleared for psychiatric evaluation and may be moved to the [facility].   Chesley Noon, MD 11/10/20 1353

## 2020-11-10 NOTE — ED Notes (Signed)
Hourly rounding reveals patient in room. No complaints, stable, in no acute distress. Q15 minute rounds and monitoring via Security Cameras to continue. 

## 2020-11-10 NOTE — ED Notes (Signed)
Pt pacing around dayroom, punching the walls and windows.  Psychiatrist made aware.  Pt refused ordered  oral medications and continued his behaviors.  Injections given with security present. Pt took injections willingly.

## 2020-11-10 NOTE — ED Notes (Signed)
Attempted to contact legal guardian without succuss. Voicemail box was set up and voicemail left for Maureen Ralphs to call back for update.

## 2020-11-10 NOTE — ED Notes (Signed)
Pt stated he still feels that he wants to hurt others and himself.  Pt then asked who decided if he would return to his group home.  RN explained to the patient that would be up to the psychiatrist.  Pt accepting and stated he would speak with his doctor tomorrow.

## 2020-11-10 NOTE — ED Notes (Signed)
Levemir requested from pharmacy 

## 2020-11-10 NOTE — ED Notes (Signed)
Patient is resting comfortably. Patient updated on poc and pending repeat lithium level. INT to be removed after lithium level results, patient verbalized understanding.

## 2020-11-10 NOTE — ED Notes (Signed)
Vol pending placement moved to [facility] 4

## 2020-11-10 NOTE — ED Notes (Signed)
Pt allowed RN to check his blood sugar without issue.

## 2020-11-10 NOTE — ED Notes (Signed)
Pt ambulatory to the bathroom. Staff assisted with V pole but pt was able to ambulate independently. Back in bed and sleeping again at this time. Lights remain dimmed and door open.

## 2020-11-10 NOTE — BH Assessment (Signed)
PATIENT BED AVAILABLE PENDING ADMISSION ORDERS FROM [name] NP  Patient is to be admitted to [name] by Psychiatric Nurse Practitioner Rashaun.  Attending Physician will be Dr. Neale Burly.   Patient has been assigned to room 320, by [name] Charge Nurse Britta Mccreedy.   Intake Paper Work has been signed and placed on patient chart.  ER staff is aware of the admission:  [name] ER Secretary    Dr. Ovidio Kin, ER MD   [name] Patient's Nurse   [facility] Patient Access.  Patient's legal guardian [name] [phone] gave verbal consent for treatment

## 2020-11-10 NOTE — ED Notes (Signed)
Patient has been referred toReferral information for Psychiatric Hospitalization faxed to;   [facility] [place] [place] [phone]),   . [facility] [phone]- [phone]) [phone]  . [place] [phone]),  . [place] [phone] or [phone])  . [facility] [phone]),   . [facility] [phone])  . [place]  . [facility]  . [employer]

## 2020-11-10 NOTE — ED Notes (Signed)
Pt sleeping, will assess vitals when awake. 

## 2020-11-10 NOTE — ED Notes (Signed)
[facility], pending placement after medically cleared

## 2020-11-10 NOTE — ED Notes (Signed)
Pt asking to be transferred to another hospital.  Pt states this hospital isn't helping him "just sending him back to the same situation."

## 2020-11-10 NOTE — Consult Note (Signed)
[facility] Psych ED Discharge  11/10/2020 5:02 PM Lance [name]  MRN:  [id number] Principal Problem: Schizoaffective disorder, bipolar type [facility]) Discharge Diagnoses: Principal Problem:   Schizoaffective disorder, bipolar type (HCC)  Subjective: "I don't like them and they don't like me", referring to staff and residents at this group home.  He wants a new group, a frequent request of his.  45 yo male presents with homicidal ideations towards people in his group home, no intent or plan.  No suicidal ideations and continues to have visual hallucinations of seeing shapes occasionally, no command hallucinations.  He is well known to this ED for similar presentations and desire for a new group home.  Lance [name] has been at this one for two years with no major issues.  His Lithium level was elevated and Lithium held with a repeat Lithium level, Lithium will be adjusted once his levels normalize.  Denies any symptoms of lithium toxicity except he stated, "I just want to hurt people at my group home."  No mania, substance abuse, or issues with sleeping or eating.  Later, when it was mentioned about the possibility of returning to his group home tomorrow, he began punching walls and calmed with agitation medications.  No prior issues with acting out in the ED.  Calm and cooperative now.  He would not talk to his RN when she tried during this time.  Evaluate tomorrow for Lithium stability, adjustments in dosages, and stabilization of mood to potentially return to his group home and follow up with outpatient services.  Total Time spent with patient: 45 minutes  Past Psychiatric History: schizoaffective disorder  Past Medical History:  Past Medical History:  Diagnosis Date  . Depression   . Diabetes mellitus without complication (HCC)   . Hyperlipidemia   . Hypertension   . Lupus anticoagulant disorder (HCC) 06/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  . PE (pulmonary thromboembolism) (HCC)   . Schizo  affective schizophrenia (HCC)   . Supratherapeutic INR 11/19/2016   History reviewed. No pertinent surgical history. Family History:  Family History  Problem Relation Age of Onset  . CAD Mother   . CAD Sister    Family Psychiatric  History: none Social History:  Social History   Substance and Sexual Activity  Alcohol Use No   Comment: occassionally     Social History   Substance and Sexual Activity  Drug Use Not Currently  . Types: Marijuana   Comment: Last use 11/29/16    Social History   Socioeconomic History  . Marital status: Single    Spouse name: Not on file  . Number of children: Not on file  . Years of education: Not on file  . Highest education level: Not on file  Occupational History  . Not on file  Tobacco Use  . Smoking status: Current Every Day Smoker    Packs/day: 1.00    Years: 23.00    Pack years: 23.00    Types: Cigarettes  . Smokeless tobacco: Never Used  . Tobacco comment: will provide material  Substance and Sexual Activity  . Alcohol use: No    Comment: occassionally  . Drug use: Not Currently    Types: Marijuana    Comment: Last use 11/29/16  . Sexual activity: Never    Birth control/protection: None    Comment: occasional marijuana- none recently  Other Topics Concern  . Not on file  Social History Narrative   From a group home in [place]  Social Determinants of Health   Financial Resource Strain: Not on file  Food Insecurity: Not on file  Transportation Needs: Not on file  Physical Activity: Not on file  Stress: Not on file  Social Connections: Not on file    Has this patient used any form of tobacco in the last 30 days? (Cigarettes, Smokeless Tobacco, Cigars, and/or Pipes) A prescription for an FDA-approved tobacco cessation medication was offered at discharge and the patient refused  Current Medications: Current Facility-Administered Medications  Medication Dose Route Frequency Provider Last Rate Last Admin  . albuterol  (VENTOLIN HFA) 108 (90 Base) MCG/ACT inhaler 1-2 puff  1-2 puff Inhalation Q6H PRN Gilles Chiquito, MD      . apixaban (ELIQUIS) tablet 5 mg  5 mg Oral BID Gilles Chiquito, MD   5 mg at 11/10/20 1148  . cloZAPine (CLOZARIL) tablet 500 mg  500 mg Oral QHS Gilles Chiquito, MD   500 mg at 11/10/20 0031  . insulin aspart (novoLOG) injection 0-15 Units  0-15 Units Subcutaneous Q4H Gilles Chiquito, MD   2 Units at 11/10/20 0034  . insulin detemir (LEVEMIR) injection 10 Units  10 Units Subcutaneous BID Gilles Chiquito, MD      . lisinopril (ZESTRIL) tablet 2.5 mg  2.5 mg Oral Daily Gilles Chiquito, MD   2.5 mg at 11/10/20 1148  . metFORMIN (GLUCOPHAGE) tablet 1,000 mg  1,000 mg Oral BID WC Gilles Chiquito, MD      . OLANZapine [facility]) tablet 20 mg  20 mg Oral QHS Gilles Chiquito, MD   20 mg at 11/10/20 0029  . propranolol (INDERAL) tablet 30 mg  30 mg Oral QHS Gilles Chiquito, MD   30 mg at 11/10/20 0030  . sertraline (ZOLOFT) tablet 50 mg  50 mg Oral Daily Gilles Chiquito, MD   50 mg at 11/10/20 1148  . simvastatin (ZOCOR) tablet 40 mg  40 mg Oral q1800 Gilles Chiquito, MD       Current Outpatient Medications  Medication Sig Dispense Refill  . ADVAIR DISKUS 250-50 MCG/ACT AEPB Inhale 1 puff into the lungs 2 (two) times daily.    Marland Kitchen apixaban (ELIQUIS) 5 MG TABS tablet Take 1 tablet (5 mg total) by mouth 2 (two) times daily. 60 tablet 6  . cloZAPine (CLOZARIL) 100 MG tablet Take 5 tablets (500 mg total) by mouth at bedtime. 150 tablet 1  . hydrOXYzine (ATARAX/VISTARIL) 50 MG tablet Take 2 tablets (100 mg total) by mouth at bedtime. 60 tablet 1  . LEVEMIR FLEXTOUCH 100 UNIT/ML FlexPen Inject 10 Units into the skin 2 (two) times daily.    Marland Kitchen lisinopril (ZESTRIL) 2.5 MG tablet Take 1 tablet (2.5 mg total) by mouth daily. 30 tablet 1  . lithium carbonate (ESKALITH) 450 MG CR tablet Take 1 tablet (450 mg total) by mouth every 12 (twelve) hours. 60 tablet 1  . metFORMIN (GLUCOPHAGE) 1000 MG tablet  Take 1 tablet (1,000 mg total) by mouth 2 (two) times daily with a meal. 60 tablet 1  . OLANZapine (ZYPREXA) 20 MG tablet Take 20 mg by mouth at bedtime.    . Omega-3 Fatty Acids (FISH OIL) 1000 MG CAPS Take 1 capsule by mouth daily.    . propranolol (INDERAL) 10 MG tablet Take 3 tablets (30 mg total) by mouth at bedtime. 90 tablet 1  . sertraline (ZOLOFT) 50 MG tablet Take 1 tablet (50 mg total) by mouth daily. 30 tablet 1  .  simvastatin (ZOCOR) 40 MG tablet Take 1 tablet (40 mg total) by mouth daily at 6 PM. 30 tablet 6  . traZODone (DESYREL) 100 MG tablet Take 1 tablet (100 mg total) by mouth at bedtime. 30 tablet 1  . albuterol (VENTOLIN HFA) 108 (90 Base) MCG/ACT inhaler Inhale 1-2 puffs into the lungs every 6 (six) hours as needed for wheezing or shortness of breath. 18 g 1  . glucose blood (FORA V30A BLOOD GLUCOSE TEST) test strip Use as instructed 100 each 12  . NOVOFINE AUTOCOVER PEN NEEDLE 30G X 8 MM MISC      PTA Medications: (Not in a hospital admission)   Musculoskeletal: Strength & Muscle Tone: within normal limits Gait & Station: normal Patient leans: N/A  Psychiatric Specialty Exam:  Presentation  General Appearance:  Dishelved  Eye Contact:Minimal  Speech:Clear and Coherent  Speech Volume:Normal  Handedness:Right  Mood and Affect  Mood:Dysphoric; Hopeless; Worthless  Affect:  Probation officer Processes:Coherent  Descriptions of Associations:Intact  Orientation:Full (Time, Place and Person)  Thought Content:  WDL  History of Schizophrenia/Schizoaffective disorder:Yes  Duration of Psychotic Symptoms:Greater than six months  Hallucinations:  Visual--shapes at times  Ideas of Reference: None  Suicidal Thoughts: None  Homicidal Thoughts: Passive homicidal ideations  Sensorium  Memory:Immediate Fair; Recent Fair  Judgment: Fair  Insight: Fair  Art therapist  Concentration:Fair  Attention  Span:Fair  Recall:Fair  Fund of Knowledge:Fair  Language:Fair   Psychomotor Activity  Psychomotor Activity:Psychomotor Activity: Restlessness; Normal   Assets  Assets:Communication Skills; Physical Health; Housing   Sleep  Sleep:  Good   Physical Exam: Physical Exam Vitals and nursing note reviewed.  Constitutional:      Appearance: Normal appearance.  HENT:     Head: Normocephalic.     Nose: Nose normal.  Pulmonary:     Effort: Pulmonary effort is normal.  Musculoskeletal:        General: Normal range of motion.     Cervical back: Normal range of motion.  Neurological:     General: No focal deficit present.     Mental Status: He is alert and oriented to person, place, and time.  Psychiatric:        Attention and Perception: Attention and perception normal.        Mood and Affect: Mood is anxious and depressed.        Speech: Speech normal.        Behavior: Behavior normal. Behavior is cooperative.        Thought Content: Thought content normal.        Cognition and Memory: Cognition and memory normal.        Judgment: Judgment normal.    Review of Systems  Psychiatric/Behavioral: Positive for depression and hallucinations. The patient is nervous/anxious.   All other systems reviewed and are negative.  Blood pressure 123/71, pulse 66, temperature 98.7 F (37.1 C), temperature source Oral, resp. rate 18, height 5\' 10"  (1.778 m), weight 82.6 kg, SpO2 99 %. Body mass index is 26.13 kg/m.   Plan Of Care/Follow-up recommendations:  Schizoaffective disorder, bipolar type: -Continue Clozaril 500 mg daily -Holding Lithium 450 mg BID -Ordered a Lithium level for the am, trending down  Depression: -Continued Zoloft 50 mg daily  Insomnia: -Continued Trazodone 100 mg daily at bedtime -Continue hydroxyzine 100 mg daily at bedtime  Disposition: re-evaluate his Lithium level tomorrow and mood stabilization  , NP 11/10/2020, 5:02 PM

## 2020-11-10 NOTE — ED Notes (Signed)
Report to include Situation, Background, Assessment, and Recommendations received from Amy RN. Patient alert and oriented, warm and dry, in no acute distress. Patient denies SI, HI, AVH and pain. Patient made aware of Q15 minute rounds and security cameras for their safety. Patient instructed to come to me with needs or concerns.  

## 2020-11-11 ENCOUNTER — Inpatient Hospital Stay
Admission: RE | Admit: 2020-11-11 | Discharge: 2020-11-19 | DRG: 885 | Disposition: A | Discharge status: Home / Self Care | Payer: Medicare Other | Source: Intra-hospital | Attending: Behavioral Health | Admitting: Behavioral Health

## 2020-11-11 ENCOUNTER — Encounter: Payer: Self-pay | Admitting: Psychiatric/Mental Health

## 2020-11-11 ENCOUNTER — Other Ambulatory Visit: Payer: Self-pay

## 2020-11-11 DIAGNOSIS — Z7901 Long term (current) use of anticoagulants: Secondary | ICD-10-CM | POA: Diagnosis not present

## 2020-11-11 DIAGNOSIS — Z880 Allergy status to penicillin: Secondary | ICD-10-CM | POA: Diagnosis not present

## 2020-11-11 DIAGNOSIS — D6862 Lupus anticoagulant syndrome: Secondary | ICD-10-CM | POA: Diagnosis present

## 2020-11-11 DIAGNOSIS — E7800 Pure hypercholesterolemia, unspecified: Secondary | ICD-10-CM | POA: Diagnosis present

## 2020-11-11 DIAGNOSIS — E785 Hyperlipidemia, unspecified: Secondary | ICD-10-CM | POA: Diagnosis present

## 2020-11-11 DIAGNOSIS — R45851 Suicidal ideations: Secondary | ICD-10-CM | POA: Diagnosis present

## 2020-11-11 DIAGNOSIS — Z86711 Personal history of pulmonary embolism: Secondary | ICD-10-CM | POA: Diagnosis not present

## 2020-11-11 DIAGNOSIS — E119 Type 2 diabetes mellitus without complications: Secondary | ICD-10-CM

## 2020-11-11 DIAGNOSIS — F250 Schizoaffective disorder, bipolar type: Secondary | ICD-10-CM | POA: Diagnosis not present

## 2020-11-11 DIAGNOSIS — I2510 Atherosclerotic heart disease of native coronary artery without angina pectoris: Secondary | ICD-10-CM | POA: Diagnosis present

## 2020-11-11 DIAGNOSIS — F17200 Nicotine dependence, unspecified, uncomplicated: Secondary | ICD-10-CM | POA: Diagnosis present

## 2020-11-11 DIAGNOSIS — R45850 Homicidal ideations: Secondary | ICD-10-CM | POA: Diagnosis present

## 2020-11-11 DIAGNOSIS — F17210 Nicotine dependence, cigarettes, uncomplicated: Secondary | ICD-10-CM | POA: Diagnosis present

## 2020-11-11 DIAGNOSIS — I10 Essential (primary) hypertension: Secondary | ICD-10-CM | POA: Diagnosis present

## 2020-11-11 DIAGNOSIS — J45909 Unspecified asthma, uncomplicated: Secondary | ICD-10-CM | POA: Diagnosis present

## 2020-11-11 DIAGNOSIS — F319 Bipolar disorder, unspecified: Secondary | ICD-10-CM | POA: Diagnosis present

## 2020-11-11 DIAGNOSIS — Z794 Long term (current) use of insulin: Secondary | ICD-10-CM | POA: Diagnosis not present

## 2020-11-11 LAB — GLUCOSE, CAPILLARY
Glucose-Capillary: 118 mg/dL — ABNORMAL HIGH (ref 70–99)
Glucose-Capillary: 99 mg/dL (ref 70–99)
Glucose-Capillary: 99 mg/dL (ref 70–99)
Glucose-Capillary: 94 mg/dL (ref 70–99)

## 2020-11-11 MED ORDER — INSULIN ASPART 100 UNIT/ML IJ SOLN
0.0000 [IU] | Freq: Three times a day (TID) | INTRAMUSCULAR | Status: DC
  Administered 2020-11-12 – 2020-11-18 (×6): 2 [IU] via SUBCUTANEOUS
  Filled 2020-11-11 (×5): qty 1

## 2020-11-11 MED ORDER — ALUM & MAG HYDROXIDE-SIMETH 200-200-20 MG/5ML PO SUSP: 30.0000 mL | ORAL | Status: DC | PRN

## 2020-11-11 MED ORDER — CLOZAPINE 100 MG PO TABS
500.0000 mg | ORAL_TABLET | Freq: Every day | ORAL | Status: DC
  Administered 2020-11-11 – 2020-11-18 (×8): 500 mg via ORAL
  Filled 2020-11-11 (×8): qty 5

## 2020-11-11 MED ORDER — METFORMIN HCL 500 MG PO TABS
1000.0000 mg | ORAL_TABLET | Freq: Two times a day (BID) | ORAL | Status: DC
  Administered 2020-11-11 – 2020-11-19 (×17): 1000 mg via ORAL
  Filled 2020-11-11 (×17): qty 2

## 2020-11-11 MED ORDER — SERTRALINE HCL 25 MG PO TABS
50.0000 mg | ORAL_TABLET | Freq: Every day | ORAL | Status: DC
  Administered 2020-11-11 – 2020-11-19 (×9): 50 mg via ORAL
  Filled 2020-11-11 (×10): qty 2

## 2020-11-11 MED ORDER — ALBUTEROL SULFATE HFA 108 (90 BASE) MCG/ACT IN AERS
1.0000 | INHALATION_SPRAY | Freq: Four times a day (QID) | RESPIRATORY_TRACT | Status: DC | PRN
  Filled 2020-11-11: qty 6.7

## 2020-11-11 MED ORDER — LITHIUM CARBONATE ER 300 MG PO TBCR
300.0000 mg | EXTENDED_RELEASE_TABLET | Freq: Every morning | ORAL | Status: DC
  Administered 2020-11-11 – 2020-11-19 (×9): 300 mg via ORAL
  Filled 2020-11-11 (×8): qty 1

## 2020-11-11 MED ORDER — ACETAMINOPHEN 325 MG PO TABS: 650.0000 mg | ORAL_TABLET | Freq: Four times a day (QID) | ORAL | Status: DC | PRN

## 2020-11-11 MED ORDER — PROPRANOLOL HCL 20 MG PO TABS
30.0000 mg | ORAL_TABLET | Freq: Every day | ORAL | Status: DC
  Administered 2020-11-11 – 2020-11-12 (×2): 30 mg via ORAL
  Filled 2020-11-11 (×2): qty 2

## 2020-11-11 MED ORDER — INSULIN ASPART 100 UNIT/ML IJ SOLN: 0.0000 [IU] | Freq: Every day | INTRAMUSCULAR | Status: DC

## 2020-11-11 MED ORDER — APIXABAN 5 MG PO TABS
5.0000 mg | ORAL_TABLET | Freq: Two times a day (BID) | ORAL | Status: DC
Start: 2020-11-11 — End: 2020-11-19
  Administered 2020-11-11 – 2020-11-19 (×17): 5 mg via ORAL
  Filled 2020-11-11 (×19): qty 1

## 2020-11-11 MED ORDER — LITHIUM CARBONATE ER 450 MG PO TBCR
450.0000 mg | EXTENDED_RELEASE_TABLET | Freq: Every day | ORAL | Status: DC
  Administered 2020-11-11 – 2020-11-18 (×8): 450 mg via ORAL
  Filled 2020-11-11 (×8): qty 1

## 2020-11-11 MED ORDER — HYDROXYZINE HCL 50 MG PO TABS
100.0000 mg | ORAL_TABLET | Freq: Every day | ORAL | Status: DC
  Administered 2020-11-11: 100 mg via ORAL
  Filled 2020-11-11: qty 2

## 2020-11-11 MED ORDER — SIMVASTATIN 40 MG PO TABS
40.0000 mg | ORAL_TABLET | Freq: Every day | ORAL | Status: DC
  Administered 2020-11-11 – 2020-11-18 (×8): 40 mg via ORAL
  Filled 2020-11-11 (×9): qty 1

## 2020-11-11 MED ORDER — MAGNESIUM HYDROXIDE 400 MG/5ML PO SUSP: 30.0000 mL | Freq: Every day | ORAL | Status: DC | PRN

## 2020-11-11 MED ORDER — INSULIN GLARGINE 100 UNIT/ML ~~LOC~~ SOLN
10.0000 [IU] | Freq: Two times a day (BID) | SUBCUTANEOUS | Status: DC
  Administered 2020-11-11 – 2020-11-19 (×17): 10 [IU] via SUBCUTANEOUS
  Filled 2020-11-11 (×19): qty 0.1

## 2020-11-11 MED ORDER — TRAZODONE HCL 100 MG PO TABS
100.0000 mg | ORAL_TABLET | Freq: Every day | ORAL | Status: DC
  Administered 2020-11-11 – 2020-11-18 (×8): 100 mg via ORAL
  Filled 2020-11-11 (×8): qty 1

## 2020-11-11 MED ORDER — OLANZAPINE 10 MG PO TABS
20.0000 mg | ORAL_TABLET | Freq: Every day | ORAL | Status: DC
  Administered 2020-11-11 – 2020-11-18 (×8): 20 mg via ORAL
  Filled 2020-11-11 (×8): qty 2

## 2020-11-11 NOTE — H&P (Signed)
Psychiatric Admission Assessment Adult  Patient Identification: Lance [name] MRN:  [id number] Date of Evaluation:  11/11/2020 Chief Complaint:  Bipolar 1 disorder (HCC) [F31.9] Principal Diagnosis: Schizoaffective disorder, bipolar type (HCC) Diagnosis:  Principal Problem:   Schizoaffective disorder, bipolar type (HCC) Active Problems:   Diabetes mellitus without complication (HCC)   Hyperlipidemia   Chronic anticoagulation   Tobacco use disorder  CC "I have real problems."   History of Present Illness: 45 year old male with schizoaffective bipolar type presenting with suicidal ideations to cut his wrists, and homicidal ideations toward his group home members with plan to stab them.  No acute events overnight, medication compliant, attending to ADLs.  Patient notes that he is having issues with people at his group home not liking him.  He notes this is yet another hospital stay trying to get assistance to live somewhere else.  When asked if he had been in touch with his ACT team or guardian, he says not since his last hospital stay.  When reminded it was up to his guardian where he lived, he does threaten to cut his wrists and sue the hospital for his death.  He is unable to clearly verbalize his grievance with the group home, or what is happening.  Contacted his group home [phone].  Harriett Sine answers the phone, and reports that Isamar has not had any real issues at the group home.  She reports that Harris does come to the hospital 16 times this year.  The only real grievance she is aware of is that his fellow group home members refused to buy him cigarettes.  She notes that Galvin has a very hard time managing his money.  He will often buy 2 packs of cigarettes and then other items with his money.  He then smokes always cigarettes prior to his next paycheck.  Sometimes his fellow group home members will share their cigarettes or buy him cigarettes with Fayrene Fearing as promised to pay them back.  There  were no physical altercations, or any safety concerns with Fayrene Fearing.  He is welcome back to the group home.  Contacted his legal guardian [name] at [phone].  She reports that she aims did not reach out after returning to the group home, and had no current complaints about his group home until showing up to the hospital.  Maureen Ralphs is unsure what spurred on this current admission.  She did say she would reach out to the [facility] ACT team to work on alternate housing.  Associated Signs/Symptoms: Depression Symptoms:  depressed mood, difficulty concentrating, hopelessness, suicidal thoughts with specific plan, anxiety, Duration of Depression Symptoms: Greater than two weeks  (Hypo) Manic Symptoms:  Impulsivity, Anxiety Symptoms:  Excessive Worry, Psychotic Symptoms:  Paranoia, PTSD Symptoms: Negative Total Time spent with patient: 1 hour  Past Psychiatric History: Long history of mental illness diagnosis of schizoaffective disorder. Frequently presents with similar complaints of psychotic symptoms, paranoia, as well as suicidal ideation. Past history of cutting and self injury. Minimal past history of aggression. Remote history of alcohol and marijuana abuse that has ceased since being in group home setting.   Is the patient at risk to self? Yes.    Has the patient been a risk to self in the past 6 months? Yes.    Has the patient been a risk to self within the distant past? Yes.    Is the patient a risk to others? Yes.    Has the patient been a risk to others in the  past 6 months? No.  Has the patient been a risk to others within the distant past? No.   Prior Inpatient Therapy:   Prior Outpatient Therapy:    Alcohol Screening: 1. How often do you have a drink containing alcohol?: Never 2. How many drinks containing alcohol do you have on a typical day when you are drinking?: 1 or 2 3. How often do you have six or more drinks on one occasion?: Never AUDIT-C Score: 0 4. How  often during the last year have you found that you were not able to stop drinking once you had started?: Never 5. How often during the last year have you failed to do what was normally expected from you because of drinking?: Never 6. How often during the last year have you needed a first drink in the morning to get yourself going after a heavy drinking session?: Never 7. How often during the last year have you had a feeling of guilt of remorse after drinking?: Never 8. How often during the last year have you been unable to remember what happened the night before because you had been drinking?: Never 9. Have you or someone else been injured as a result of your drinking?: No 10. Has a relative or friend or a doctor or another health worker been concerned about your drinking or suggested you cut down?: No Alcohol Use Disorder Identification Test Final Score (AUDIT): 0 Alcohol Brief Interventions/Follow-up: Alcohol education/Brief advice Substance Abuse History in the last 12 months:  No. Consequences of Substance Abuse: Negative Previous Psychotropic Medications: Yes  Psychological Evaluations: Yes  Past Medical History:  Past Medical History:  Diagnosis Date  . Depression   . Diabetes mellitus without complication (HCC)   . Hyperlipidemia   . Hypertension   . Lupus anticoagulant disorder (HCC) 06/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  . PE (pulmonary thromboembolism) (HCC)   . Schizo affective schizophrenia (HCC)   . Supratherapeutic INR 11/19/2016   History reviewed. No pertinent surgical history. Family History:  Family History  Problem Relation Age of Onset  . CAD Mother   . CAD Sister    Family Psychiatric  History: None reported Tobacco Screening: Have you used any form of tobacco in the last 30 days? (Cigarettes, Smokeless Tobacco, Cigars, and/or Pipes): Yes Tobacco use, Select all that apply: 5 or more cigarettes per day Are you interested in Tobacco Cessation  Medications?: Yes, will notify MD for an order Counseled patient on smoking cessation including recognizing danger situations, developing coping skills and basic information about quitting provided: Yes Social History:  Social History   Substance and Sexual Activity  Alcohol Use No   Comment: occassionally     Social History   Substance and Sexual Activity  Drug Use Not Currently  . Types: Marijuana   Comment: Last use 11/29/16    Additional Social History:                           Allergies:   Allergies  Allergen Reactions  . Penicillins Other (See Comments)    Reaction: "lockjaw" Has patient had a PCN reaction causing immediate rash, facial/tongue/throat swelling, SOB or lightheadedness with hypotension: No Has patient had a PCN reaction causing severe rash involving mucus membranes or skin necrosis: No Has patient had a PCN reaction that required hospitalization: No Has patient had a PCN reaction occurring within the last 10 years: No If all of the above  answers are "NO", then may proceed with Cephalosporin use.    Lab Results:  Results for orders placed or performed during the hospital encounter of 11/11/20 (from the past 48 hour(s))  Glucose, capillary     Status: Abnormal   Collection Time: 11/11/20  7:31 AM  Result Value Ref Range   Glucose-Capillary 118 (H) 70 - 99 mg/dL    Comment: Glucose reference range applies only to samples taken after fasting for at least 8 hours.    Blood Alcohol level:  Lab Results  Component Value Date   ETH <10 11/09/2020   ETH <10 09/19/2020    Metabolic Disorder Labs:  Lab Results  Component Value Date   HGBA1C 5.1 09/20/2020   MPG 99.67 09/20/2020   MPG 105.41 07/16/2020   Lab Results  Component Value Date   PROLACTIN 2.0 (L) 08/31/2016   PROLACTIN 4.8 05/22/2016   Lab Results  Component Value Date   CHOL 157 09/20/2020   TRIG 447 (H) 09/20/2020   HDL 32 (L) 09/20/2020   CHOLHDL 4.9 09/20/2020   VLDL  UNABLE TO CALCULATE IF TRIGLYCERIDE OVER 400 mg/dL 78/58/8502   LDLCALC UNABLE TO CALCULATE IF TRIGLYCERIDE OVER 400 mg/dL 77/41/2878   LDLCALC 53 07/16/2020    Current Medications: Current Facility-Administered Medications  Medication Dose Route Frequency Provider Last Rate Last Admin  . acetaminophen (TYLENOL) tablet 650 mg  650 mg Oral Q6H PRN Dixon, Rashaun M, NP      . albuterol (VENTOLIN HFA) 108 (90 Base) MCG/ACT inhaler 1-2 puff  1-2 puff Inhalation Q6H PRN Dixon, Rashaun M, NP      . alum & mag hydroxide-simeth (MAALOX/MYLANTA) 200-200-20 MG/5ML suspension 30 mL  30 mL Oral Q4H PRN Dixon, Rashaun M, NP      . apixaban (ELIQUIS) tablet 5 mg  5 mg Oral BID Lerry Liner M, NP   5 mg at 11/11/20 0734  . cloZAPine (CLOZARIL) tablet 500 mg  500 mg Oral QHS Dixon, Rashaun M, NP      . hydrOXYzine (ATARAX/VISTARIL) tablet 100 mg  100 mg Oral QHS [name], MD      . insulin glargine (LANTUS) injection 10 Units  10 Units Subcutaneous BID Jearld Lesch, NP   10 Units at 11/11/20 0801  . lithium carbonate (ESKALITH) CR tablet 450 mg  450 mg Oral QHS [name], MD      . lithium carbonate (LITHOBID) CR tablet 300 mg  300 mg Oral q AM [name], MD      . magnesium hydroxide (MILK OF MAGNESIA) suspension 30 mL  30 mL Oral Daily PRN Durwin Nora, Rashaun M, NP      . metFORMIN (GLUCOPHAGE) tablet 1,000 mg  1,000 mg Oral BID WC Dixon, Rashaun M, NP   1,000 mg at 11/11/20 0733  . OLANZapine (ZYPREXA) tablet 20 mg  20 mg Oral QHS Dixon, Rashaun M, NP      . propranolol (INDERAL) tablet 30 mg  30 mg Oral QHS Dixon, Rashaun M, NP      . sertraline (ZOLOFT) tablet 50 mg  50 mg Oral Daily Dixon, Rashaun M, NP   50 mg at 11/11/20 0733  . simvastatin (ZOCOR) tablet 40 mg  40 mg Oral q1800 Jearld Lesch, NP      . traZODone (DESYREL) tablet 100 mg  100 mg Oral QHS [name], MD       PTA Medications: Medications Prior to Admission  Medication Sig Dispense Refill Last  Dose  .  ADVAIR DISKUS 250-50 MCG/ACT AEPB Inhale 1 puff into the lungs 2 (two) times daily.     Marland Kitchen. albuterol (VENTOLIN HFA) 108 (90 Base) MCG/ACT inhaler Inhale 1-2 puffs into the lungs every 6 (six) hours as needed for wheezing or shortness of breath. 18 g 1   . apixaban (ELIQUIS) 5 MG TABS tablet Take 1 tablet (5 mg total) by mouth 2 (two) times daily. 60 tablet 6   . cloZAPine (CLOZARIL) 100 MG tablet Take 5 tablets (500 mg total) by mouth at bedtime. 150 tablet 1   . glucose blood (FORA V30A BLOOD GLUCOSE TEST) test strip Use as instructed 100 each 12   . hydrOXYzine (ATARAX/VISTARIL) 50 MG tablet Take 2 tablets (100 mg total) by mouth at bedtime. 60 tablet 1   . LEVEMIR FLEXTOUCH 100 UNIT/ML FlexPen Inject 10 Units into the skin 2 (two) times daily.     Marland Kitchen. lisinopril (ZESTRIL) 2.5 MG tablet Take 1 tablet (2.5 mg total) by mouth daily. 30 tablet 1   . lithium carbonate (ESKALITH) 450 MG CR tablet Take 1 tablet (450 mg total) by mouth every 12 (twelve) hours. 60 tablet 1   . metFORMIN (GLUCOPHAGE) 1000 MG tablet Take 1 tablet (1,000 mg total) by mouth 2 (two) times daily with a meal. 60 tablet 1   . NOVOFINE AUTOCOVER PEN NEEDLE 30G X 8 MM MISC      . OLANZapine (ZYPREXA) 20 MG tablet Take 20 mg by mouth at bedtime.     . Omega-3 Fatty Acids (FISH OIL) 1000 MG CAPS Take 1 capsule by mouth daily.     . propranolol (INDERAL) 10 MG tablet Take 3 tablets (30 mg total) by mouth at bedtime. 90 tablet 1   . sertraline (ZOLOFT) 50 MG tablet Take 1 tablet (50 mg total) by mouth daily. 30 tablet 1   . simvastatin (ZOCOR) 40 MG tablet Take 1 tablet (40 mg total) by mouth daily at 6 PM. 30 tablet 6   . traZODone (DESYREL) 100 MG tablet Take 1 tablet (100 mg total) by mouth at bedtime. 30 tablet 1     Musculoskeletal: Strength & Muscle Tone: within normal limits Gait & Station: normal Patient leans: N/A            Psychiatric Specialty Exam:  Presentation  General Appearance: Disheveled  Eye  Contact:Minimal  Speech:Clear and Coherent  Speech Volume:Normal  Handedness:Right   Mood and Affect  Mood:Depressed; Dysphoric; Hopeless  Affect:Congruent; Constricted   Thought Process  Thought Processes:Goal Directed  Duration of Psychotic Symptoms: Greater than six months  Past Diagnosis of Schizophrenia or Psychoactive disorder: Yes  Descriptions of Associations:Intact  Orientation:Full (Time, Place and Person)  Thought Content:Perseveration; Rumination  Hallucinations:Hallucinations: Auditory; Visual  Ideas of Reference:Paranoia; Percusatory  Suicidal Thoughts:Suicidal Thoughts: Yes, Active SI Active Intent and/or Plan: With Plan  Homicidal Thoughts:Homicidal Thoughts: Yes, Active HI Active Intent and/or Plan: With Plan   Sensorium  Memory:Immediate Fair; Recent Fair; Remote Fair  Judgment:Impaired  Insight:Lacking   Executive Functions  Concentration:Fair  Attention Span:Fair  Recall:Fair  Fund of Knowledge:Fair  Language:Fair   Psychomotor Activity  Psychomotor Activity:Psychomotor Activity: Restlessness   Assets  Assets:Communication Skills; Desire for Improvement; Financial Resources/Insurance; Housing; Social Support   Sleep  Sleep:Sleep: Poor    Physical Exam: Physical Exam Vitals and nursing note reviewed.  Constitutional:      Appearance: Normal appearance.  HENT:     Head: Normocephalic and atraumatic.     Right  Ear: External ear normal.     Left Ear: External ear normal.     Nose: Nose normal.     Mouth/Throat:     Mouth: Mucous membranes are moist.     Pharynx: Oropharynx is clear.  Eyes:     Extraocular Movements: Extraocular movements intact.     Conjunctiva/sclera: Conjunctivae normal.     Pupils: Pupils are equal, round, and reactive to light.  Cardiovascular:     Rate and Rhythm: Normal rate.     Pulses: Normal pulses.  Pulmonary:     Effort: Pulmonary effort is normal.     Breath sounds: Normal breath  sounds.  Abdominal:     General: Abdomen is flat.     Palpations: Abdomen is soft.  Musculoskeletal:        General: No swelling. Normal range of motion.     Cervical back: Normal range of motion and neck supple.  Skin:    General: Skin is warm and dry.  Neurological:     General: No focal deficit present.     Mental Status: He is alert and oriented to person, place, and time.  Psychiatric:        Attention and Perception: He perceives auditory hallucinations.        Mood and Affect: Mood is depressed.        Speech: Speech normal.        Behavior: Behavior is agitated.        Thought Content: Thought content includes homicidal and suicidal ideation. Thought content includes homicidal and suicidal plan.        Cognition and Memory: Cognition and memory normal.        Judgment: Judgment is inappropriate.    Review of Systems  Constitutional: Negative.   HENT: Negative.   Eyes: Negative.   Respiratory: Negative.   Cardiovascular: Negative.   Gastrointestinal: Negative.   Genitourinary: Negative.   Musculoskeletal: Negative.   Skin: Negative.   Neurological: Negative.   Endo/Heme/Allergies: Positive for environmental allergies. Does not bruise/bleed easily.  Psychiatric/Behavioral: Positive for depression and suicidal ideas. The patient is nervous/anxious and has insomnia.    Blood pressure 124/80, pulse 98, temperature 98.3 F (36.8 C), temperature source Oral, resp. rate 17, height  (1.778 m), weight 82.6 kg, SpO2 100 %. Body mass index is 26.13 kg/m.  Treatment Plan Summary: Daily contact with patient to assess and evaluate symptoms and progress in treatment and Medication management 1) Schizoaffective Disorder, depressive type- established problem, unstable, current suicidal thoughts - Restart Zoloft 100 mg daily, Zyprexa 20 mg QHS, Clozapine 500 mg QHS, trazodone 100 mg QHS, propranolol 30 mg QHS for anxiety. Decrease Lithium 300 mg qam, 450 mg nightly (lithium  level elevated at 1.3 on admission on 450 mg BID dosing).    2) Diabetes Melltius Type 2 - Continue Metformin 1000 mg BID, lantus 10 mg BID, cbq with meals and bedtime with sliding scale coverage   3) HTN- established problem, stable - Hold lisinipril 2.5 mg daily for now, currently normotensive 124/80  4) Long-term anticoagulation- continue apixiban 5 mg BID  5) Hyperlipidemia- continue Simvastin 40 mg daily at 6PM.  Observation Level/Precautions:  15 minute checks  Laboratory:  hemoglobin a1c  Psychotherapy:    Medications:    Consultations:    Discharge Concerns:    Estimated LOS:  Other:     Physician Treatment Plan for Primary Diagnosis: Schizoaffective disorder, bipolar type (HCC) Long Term Goal(s): Improvement in symptoms so as ready  for discharge  Short Term Goals: Ability to identify changes in lifestyle to reduce recurrence of condition will improve, Ability to verbalize feelings will improve, Ability to disclose and discuss suicidal ideas, Ability to demonstrate self-control will improve, Ability to identify and develop effective coping behaviors will improve, Ability to maintain clinical measurements within normal limits will improve, Compliance with prescribed medications will improve and Ability to identify triggers associated with substance abuse/mental health issues will improve  Physician Treatment Plan for Secondary Diagnosis: Principal Problem:   Schizoaffective disorder, bipolar type (HCC) Active Problems:   Diabetes mellitus without complication (HCC)   Hyperlipidemia   Chronic anticoagulation   Tobacco use disorder  Long Term Goal(s): Improvement in symptoms so as ready for discharge  Short Term Goals: Ability to identify changes in lifestyle to reduce recurrence of condition will improve, Ability to verbalize feelings will improve, Ability to disclose and discuss suicidal ideas, Ability to demonstrate self-control will improve, Ability to identify and  develop effective coping behaviors will improve, Ability to maintain clinical measurements within normal limits will improve, Compliance with prescribed medications will improve and Ability to identify triggers associated with substance abuse/mental health issues will improve  I certify that inpatient services furnished can reasonably be expected to improve the patient's condition.    [name], MD 5/30/202210:17 AM

## 2020-11-11 NOTE — Progress Notes (Signed)
Recreation Therapy Notes  Date: 11/11/2020  Time: 9:30 am   Location: Court yard   Behavioral response: Appropriate, Restless   Intervention Topic: Social skills   Discussion/Intervention:  Group content on today was focused on social skills. The group defined social skills and identified ways they use social skills. Patients expressed what obstacles they face when trying to be social. Participants described the importance of social skills. The group listed ways to improve social skills and reasons to improve social skills. Individuals had an opportunity to learn new and improve social skills as well as identify their weaknesses. Clinical Observations/Feedback: Patient came to group and was focused on what peers and staff had to say about social skills. Individual was social with staff and peers while participating in the intervention. Patient walked in and out of group several times. Caitlynne Harbeck LRT/CTRS         Auryn Paige 11/11/2020 12:41 PM

## 2020-11-11 NOTE — Progress Notes (Signed)
Recreation Therapy Notes  INPATIENT RECREATION THERAPY ASSESSMENT  Patient Details Name: Lance [name] MRN: [id number] DOB: 04-Feb-1976 Today's Date: 11/11/2020       Information Obtained From: Patient (Patient stated everything is the same,I still need a new group home)  Able to Participate in Assessment/Interview: Yes  Patient Presentation: Responsive  Reason for Admission (Per Patient): Active Symptoms  Patient Stressors: Other (Comment) (Group home)  Coping Skills:   Music,Talk,Art  Leisure Interests (2+):  Art - Coloring,Music - Play instrument,Music - Listen,Individual - TV  Frequency of Recreation/Participation: Monthly  Awareness of Community Resources:  Yes  Community Resources:     Current Use: No  If no, Barriers?: Transportation,Financial,Other (Comment) (They do not take me anywhere)  Expressed Interest in [employer] Information: Yes  County of Residence:  [place]  Patient Main Form of Transportation:  (I do not go anywhere)  Patient Strengths:  N/A  Patient Identified Areas of Improvement:  To get a new group home  Patient Goal for Hospitalization:  To get help with my anxiety and find a new place to live  Current SI (including self-harm):  Yes (No plan)  Current HI:  No  Current AVH: No  Staff Intervention Plan: Group Attendance,Collaborate with Interdisciplinary Treatment Team  Consent to Intern Participation: N/A  Silvia Hightower 11/11/2020, 12:45 PM

## 2020-11-11 NOTE — Plan of Care (Signed)
Patient new to the unit tonight, hasn't had time to progress  Problem: Education: Goal: Knowledge of [facility] Education information/materials will improve Outcome: Not Progressing Goal: Emotional status will improve Outcome: Not Progressing Goal: Mental status will improve Outcome: Not Progressing Goal: Verbalization of understanding the information provided will improve Outcome: Not Progressing   Problem: Safety: Goal: Periods of time without injury will increase Outcome: Not Progressing   Problem: Education: Goal: Utilization of techniques to improve thought processes will improve Outcome: Not Progressing Goal: Knowledge of the prescribed therapeutic regimen will improve Outcome: Not Progressing   Problem: Safety: Goal: Ability to disclose and discuss suicidal ideas will improve Outcome: Not Progressing Goal: Ability to identify and utilize support systems that promote safety will improve Outcome: Not Progressing   

## 2020-11-11 NOTE — Progress Notes (Addendum)
Patient admitted from [facility], report received from [place], [place]. Patient calm and cooperative during assessment denying SI/HI/AVH. Patient endorses anxiety and depression stating it comes from his living situation. Patient oriented to the unit and his room. Patient given education, support, and encouragement to be active in his treatment plan. Skin assessment completed with Britta Mccreedy, RN, no abnormalities found. No contraband found. Patient given clothes and he went to sleep. Patient being monitored Q 15 minutes for safety per unit protocol. Pt remains safe on the unit.

## 2020-11-11 NOTE — Progress Notes (Signed)
On assessment, patient appears flat and depressed. Patient states, "I am so tired" multiple times during assessment. He states that he has thoughts of hurting himself and hurting other people at his group home. He states that if he has to go back to his group home, "something bad is going to happen". He notes that his plan is to use a Engineer, water. Patient later asks staff what will happen if he hurts staff members and made several verbal threats toward staff. Patient has not acted on any threats. He has been pacing the halls for most of the day. Patient is compliant with medications. He remains safe on the unit at this time and q15 min safety checks have been maintained.

## 2020-11-11 NOTE — BHH Counselor (Signed)
Adult Comprehensive Assessment  Patient ID: Lance [name], male   DOB: Aug 27, 1975, 45 y.o.   MRN: [id number]  Information Source: Information source: Patient   Current Stressors:  Patient states their primary concerns and needs for treatment are:: "I was having sleep problems and feeling suicidal and homicidal.  I still feel the same." Patient states their goals for this hospitilization and ongoing recovery are:: "to get help, find another facility to live in".  Educational / Learning stressors: Pt denies Employment / Job issues: Pt denies Family Relationships: Pt denies Surveyor, quantity / Lack of resources (include bankruptcy): "No finances" Housing / Lack of housing: "get out of my group home" Physical health (include injuries & life threatening diseases): diabetes Social relationships: Pt denies Substance abuse: "cigarettes" Bereavement / Loss: "mama, a few months ago"   Living/Environment/Situation:  Living Arrangements: Group Home Living conditions (as described by patient or guardian): "horrible" How long has patient lived in current situation?: 2.5 years What is atmosphere in current home: Chaotic   Family History:  Marital status: Single Are you sexually active?: No What is your sexual orientation?: Pt denies Has your sexual activity been affected by drugs, alcohol, medication, or emotional stress?: Pt denies Does patient have children?: No   Childhood History:  By whom was/is the patient raised?: Both parents Additional childhood history information: "raised by foster homes and group homes" Description of patient's relationship with caregiver when they were a child: "alcoholic parents, negative" Patient's description of current relationship with people who raised him/her: Deceased How were you disciplined when you got in trouble as a child/adolescent?: Pt denies Does patient have siblings?: Yes Number of Siblings: 1 (sister) Description of patient's current relationship  with siblings: Deceased Did patient suffer any verbal/emotional/physical/sexual abuse as a child?: Yes ("a little bit") Did patient suffer from severe childhood neglect?: No Has patient ever been sexually abused/assaulted/raped as an adolescent or adult?: No Was the patient ever a victim of a crime or a disaster?: No Witnessed domestic violence?: No Has patient been affected by domestic violence as an adult?: No   Education:  Highest grade of school patient has completed: "8/9th grade" Currently a student?: No Learning disability?: Yes (adhd) What learning problems does patient have?: ADHD   Employment/Work Situation:   Employment situation: On disability Why is patient on disability: Mental health(Schizophrenia) How long has patient been on disability: "since my early 20's" Patient's job has been impacted by current illness: No What is the longest time patient has a held a job?: Pt denies Has patient ever been in the [employer]?: No   Financial Resources:   Financial resources: Counselling psychologist Does patient have a Lawyer or guardian?: Yes Name of representative payee or guardian: [name]   Alcohol/Substance Abuse:   What has been your use of drugs/alcohol within the last 12 months?: "a little marijuana, a little alcohol"  If attempted suicide, did drugs/alcohol play a role in this?: No Alcohol/Substance Abuse Treatment Hx: Denies past history Has alcohol/substance abuse ever caused legal problems?: No   Social Support System:   Conservation officer, nature Support System: Fair Development worker, community Support System: "my niece, guardian, my friend [name]" Type of faith/religion: "i don't know" How does patient's faith help to cope with current illness?: "I believe in cults and I believe things exist."    Leisure/Recreation:   Do You Have Hobbies?: Yes Leisure and Hobbies: "guitar, Mining engineer, acoustic"   Strengths/Needs:   What is the patient's perception of their  strengths?: "I don't know"  Patient states they can use these personal strengths during their treatment to contribute to their recovery: "I don't know" Patient states these barriers may affect/interfere with their treatment: Pt denies Patient states these barriers may affect their return to the community: Pt reports not wanting to go back to his group home   Discharge Plan:   Currently receiving community mental health services: No (Pt denies, but has an active ACTT team) Patient states concerns and preferences for aftercare planning are: "want someone to talk to" Does patient have access to transportation?: Yes Does patient have financial barriers related to discharge medications?: No Plan for no access to transportation at discharge: CSW will coordinate transportation for pt Will patient be returning to same living situation after discharge?: Yes   Summary/Recommendations:   Summary and Recommendations (to be completed by the evaluator): Patient is a 45 year old male [place] [facility]  Chart indicates that he receives both SSDI and has Medicare Part A and B.  He presents to the hospital following reports of suicidal ideation and homicidal ideation for others in his group home.  He reports a plan to stab someone.  He has a primary diagnosis of Schizoaffective disorder, Bipolar type. Recommendations include: crisis stabilization, therapeutic milieu, encourage group attendance and participation, medication management for detox/mood stabilization and development of comprehensive mental wellness/sobriety plan.  Patient has a legal guardian, who will be contacted.  [name]. 11/11/2020

## 2020-11-11 NOTE — BHH Suicide Risk Assessment (Signed)
[facility] Admission Suicide Risk Assessment   Nursing information obtained from:  Patient Demographic factors:  Male,Caucasian Current Mental Status:  NA Loss Factors:  NA Historical Factors:  Impulsivity Risk Reduction Factors:  Positive therapeutic relationship  Total Time spent with patient: 1 hour Principal Problem: Schizoaffective disorder, bipolar type (HCC) Diagnosis:  Principal Problem:   Schizoaffective disorder, bipolar type (HCC) Active Problems:   Diabetes mellitus without complication (HCC)   Hyperlipidemia   Chronic anticoagulation   Tobacco use disorder  Subjective Data: 45 year old male with schizoaffective bipolar type presenting with suicidal ideations to cut his wrists, and homicidal ideations toward his group home members with plan to stab them.  No acute events overnight, medication compliant, attending to ADLs.  Patient notes that he is having issues with people at his group home not liking him.  He notes this is yet another hospital stay trying to get assistance to live somewhere else.  When asked if he had been in touch with his ACT team or guardian, he says not since his last hospital stay.  When reminded it was up to his guardian where he lived, he does threaten to cut his wrists and sue the hospital for his death.  He is unable to clearly verbalize his grievance with the group home, or what is happening.  Continued Clinical Symptoms:  Alcohol Use Disorder Identification Test Final Score (AUDIT): 0 The "Alcohol Use Disorders Identification Test", Guidelines for Use in Primary Care, Second Edition.  World Science writer [facility]). Score between 0-7:  no or low risk or alcohol related problems. Score between 8-15:  moderate risk of alcohol related problems. Score between 16-19:  high risk of alcohol related problems. Score 20 or above:  warrants further diagnostic evaluation for alcohol dependence and treatment.   CLINICAL FACTORS:   Severe Anxiety and/or  Agitation Schizophrenia:   Paranoid or undifferentiated type Previous Psychiatric Diagnoses and Treatments   Musculoskeletal: Strength & Muscle Tone: within normal limits Gait & Station: normal Patient leans: N/A  Psychiatric Specialty Exam:  Presentation  General Appearance: Disheveled  Eye Contact:Minimal  Speech:Clear and Coherent  Speech Volume:Normal  Handedness:Right   Mood and Affect  Mood:Depressed; Dysphoric; Hopeless  Affect:Congruent; Constricted   Thought Process  Thought Processes:Goal Directed  Descriptions of Associations:Intact  Orientation:Full (Time, Place and Person)  Thought Content:Perseveration; Rumination  History of Schizophrenia/Schizoaffective disorder:Yes  Duration of Psychotic Symptoms:Greater than six months  Hallucinations:Hallucinations: Auditory; Visual  Ideas of Reference:Paranoia; Percusatory  Suicidal Thoughts:Suicidal Thoughts: Yes, Active SI Active Intent and/or Plan: With Plan  Homicidal Thoughts:Homicidal Thoughts: Yes, Active HI Active Intent and/or Plan: With Plan   Sensorium  Memory:Immediate Fair; Recent Fair; Remote Fair  Judgment:Impaired  Insight:Lacking   Executive Functions  Concentration:Fair  Attention Span:Fair  Recall:Fair  Fund of Knowledge:Fair  Language:Fair   Psychomotor Activity  Psychomotor Activity:Psychomotor Activity: Restlessness   Assets  Assets:Communication Skills; Desire for Improvement; Financial Resources/Insurance; Housing; Social Support   Sleep  Sleep:Sleep: Poor    Physical Exam: Physical Exam ROS Blood pressure 124/80, pulse 98, temperature 98.3 F (36.8 C), temperature source Oral, resp. rate 17, height 5\' 10"  (1.778 m), weight 82.6 kg, SpO2 100 %. Body mass index is 26.13 kg/m.   COGNITIVE FEATURES THAT CONTRIBUTE TO RISK:  Closed-mindedness    SUICIDE RISK:   Mild:  Suicidal ideation of limited frequency, intensity, duration, and specificity.   There are no identifiable plans, no associated intent, mild dysphoria and related symptoms, good self-control (both objective and subjective assessment), few other  risk factors, and identifiable protective factors, including available and accessible social support.  PLAN OF CARE: Continue inpatient admission, see H&P for details.   I certify that inpatient services furnished can reasonably be expected to improve the patient's condition.   [name], MD 11/11/2020, 10:32 AM

## 2020-11-11 NOTE — Plan of Care (Signed)
Patient irritable stating he doesn't want to go back to his group home  Problem: Education: Goal: Emotional status will improve Outcome: Not Progressing Goal: Mental status will improve Outcome: Not Progressing

## 2020-11-11 NOTE — Progress Notes (Signed)
Patient calm and cooperative during assessment denying SI/AVH. Patient states he has thoughts of wanting to hurt people in the group home. Patient given education and support. Patient paces the unit but doesn't cause any issues. Patient observed interacting appropriately with staff and peers on the unit. Patient compliant with medication administration per MD orders. Pt being monitored Q 15 minutes for safety per unit protocol. Pt remains safe on the unit.

## 2020-11-11 NOTE — Tx Team (Addendum)
Interdisciplinary Treatment and Diagnostic Plan Update  11/11/2020 Time of Session: 9:00AM Lance [name] MRN: [id number]  Principal Diagnosis: <principal problem not specified>  Secondary Diagnoses: Active Problems:   Bipolar 1 disorder (HCC)   Bipolar 1 disorder (HCC)   Current Medications:  Current Facility-Administered Medications  Medication Dose Route Frequency Provider Last Rate Last Admin  . acetaminophen (TYLENOL) tablet 650 mg  650 mg Oral Q6H PRN [name], Lance M, NP      . albuterol (VENTOLIN HFA) 108 (90 Base) MCG/ACT inhaler 1-2 puff  1-2 puff Inhalation Q6H PRN [name], Lance M, NP      . alum & mag hydroxide-simeth (MAALOX/MYLANTA) 200-200-20 MG/5ML suspension 30 mL  30 mL Oral Q4H PRN [name], Lance M, NP      . apixaban (ELIQUIS) tablet 5 mg  5 mg Oral BID Lance [name] M, NP   5 mg at 11/11/20 0734  . cloZAPine (CLOZARIL) tablet 500 mg  500 mg Oral QHS [name], Lance M, NP      . insulin glargine (LANTUS) injection 10 Units  10 Units Subcutaneous BID Lance Lair, NP   10 Units at 11/11/20 0801  . magnesium hydroxide (MILK OF MAGNESIA) suspension 30 mL  30 mL Oral Daily PRN Lance [name], Lance M, NP      . metFORMIN (GLUCOPHAGE) tablet 1,000 mg  1,000 mg Oral BID WC [name], Lance M, NP   1,000 mg at 11/11/20 0733  . OLANZapine (ZYPREXA) tablet 20 mg  20 mg Oral QHS [name], Lance M, NP      . propranolol (INDERAL) tablet 30 mg  30 mg Oral QHS [name], Lance M, NP      . sertraline (ZOLOFT) tablet 50 mg  50 mg Oral Daily [name], Lance M, NP   50 mg at 11/11/20 0733  . simvastatin (ZOCOR) tablet 40 mg  40 mg Oral q1800 Lance Lair, NP       PTA Medications: Medications Prior to Admission  Medication Sig Dispense Refill Last Dose  . ADVAIR DISKUS 250-50 MCG/ACT AEPB Inhale 1 puff into the lungs 2 (two) times daily.     Lance [name] albuterol (VENTOLIN HFA) 108 (90 Base) MCG/ACT inhaler Inhale 1-2 puffs into the lungs every 6 (six) hours as needed for wheezing or  shortness of breath. 18 g 1   . apixaban (ELIQUIS) 5 MG TABS tablet Take 1 tablet (5 mg total) by mouth 2 (two) times daily. 60 tablet 6   . cloZAPine (CLOZARIL) 100 MG tablet Take 5 tablets (500 mg total) by mouth at bedtime. 150 tablet 1   . glucose blood (FORA V30A BLOOD GLUCOSE TEST) test strip Use as instructed 100 each 12   . hydrOXYzine (ATARAX/VISTARIL) 50 MG tablet Take 2 tablets (100 mg total) by mouth at bedtime. 60 tablet 1   . LEVEMIR FLEXTOUCH 100 UNIT/ML FlexPen Inject 10 Units into the skin 2 (two) times daily.     Lance [name] lisinopril (ZESTRIL) 2.5 MG tablet Take 1 tablet (2.5 mg total) by mouth daily. 30 tablet 1   . lithium carbonate (ESKALITH) 450 MG CR tablet Take 1 tablet (450 mg total) by mouth every 12 (twelve) hours. 60 tablet 1   . metFORMIN (GLUCOPHAGE) 1000 MG tablet Take 1 tablet (1,000 mg total) by mouth 2 (two) times daily with a meal. 60 tablet 1   . NOVOFINE AUTOCOVER PEN NEEDLE 30G X 8 MM MISC      . OLANZapine (ZYPREXA) 20 MG tablet Take 20 mg by mouth  at bedtime.     . Omega-3 Fatty Acids (FISH OIL) 1000 MG CAPS Take 1 capsule by mouth daily.     . propranolol (INDERAL) 10 MG tablet Take 3 tablets (30 mg total) by mouth at bedtime. 90 tablet 1   . sertraline (ZOLOFT) 50 MG tablet Take 1 tablet (50 mg total) by mouth daily. 30 tablet 1   . simvastatin (ZOCOR) 40 MG tablet Take 1 tablet (40 mg total) by mouth daily at 6 PM. 30 tablet 6   . traZODone (DESYREL) 100 MG tablet Take 1 tablet (100 mg total) by mouth at bedtime. 30 tablet 1     Patient Stressors: Educational concerns Medication change or noncompliance  Patient Strengths: Agricultural engineer for treatment/growth  Treatment Modalities: Medication Management, Group therapy, Case management,  1 to 1 session with clinician, Psychoeducation, Recreational therapy.   Physician Treatment Plan for Primary Diagnosis: <principal problem not specified> Long Term Goal(s):     Short Term Goals:     Medication Management: Evaluate patient's response, side effects, and tolerance of medication regimen.  Therapeutic Interventions: 1 to 1 sessions, Unit Group sessions and Medication administration.  Evaluation of Outcomes: Not Met  Physician Treatment Plan for Secondary Diagnosis: Active Problems:   Bipolar 1 disorder ([place])   Bipolar 1 disorder ([place])  Long Term Goal(s):     Short Term Goals:       Medication Management: Evaluate patient's response, side effects, and tolerance of medication regimen.  Therapeutic Interventions: 1 to 1 sessions, Unit Group sessions and Medication administration.  Evaluation of Outcomes: Not Met   RN Treatment Plan for Primary Diagnosis: <principal problem not specified> Long Term Goal(s): Knowledge of disease and therapeutic regimen to maintain health will improve  Short Term Goals: Ability to verbalize frustration and anger appropriately will improve, Ability to demonstrate self-control, Ability to participate in decision making will improve, Ability to verbalize feelings will improve, Ability to disclose and discuss suicidal ideas, Ability to identify and develop effective coping behaviors will improve and Compliance with prescribed medications will improve  Medication Management: RN will administer medications as ordered by provider, will assess and evaluate patient's response and provide education to patient for prescribed medication. RN will report any adverse and/or side effects to prescribing provider.  Therapeutic Interventions: 1 on 1 counseling sessions, Psychoeducation, Medication administration, Evaluate responses to treatment, Monitor vital signs and CBGs as ordered, Perform/monitor CIWA, COWS, AIMS and Fall Risk screenings as ordered, Perform wound care treatments as ordered.  Evaluation of Outcomes: Not Progressing   LCSW Treatment Plan for Primary Diagnosis: <principal problem not specified> Long Term Goal(s): Safe transition to  appropriate next level of care at discharge, Engage patient in therapeutic group addressing interpersonal concerns.  Short Term Goals: Engage patient in aftercare planning with referrals and resources, Increase social support, Increase ability to appropriately verbalize feelings, Increase emotional regulation, Facilitate acceptance of mental health diagnosis and concerns and Increase skills for wellness and recovery  Therapeutic Interventions: Assess for all discharge needs, 1 to 1 time with Social worker, Explore available resources and support systems, Assess for adequacy in community support network, Educate family and significant other(s) on suicide prevention, Complete Psychosocial Assessment, Interpersonal group therapy.  Evaluation of Outcomes: Not Met   Progress in Treatment: Attending groups: No. Participating in groups: No. Taking medication as prescribed: Yes. Toleration medication: Yes. Family/Significant other contact made: No, will contact:  CSW will contact legal guardian. Patient understands diagnosis: No. Discussing patient identified problems/goals with staff: Yes.  Medical problems stabilized or resolved: Yes. Denies suicidal/homicidal ideation: No. Issues/concerns per patient self-inventory: No. Other: none  New problem(s) identified: No, Describe:  none  New Short Term/Long Term Goal(s):  Patient Goals:  They don't like me at that group home and I don't like them.  I've been suicidal and homicidal.  I can't keep living with these people."  Discharge Plan or Barriers: CSW will assist the patient in developing appropriate discharge plans.   Reason for Continuation of Hospitalization: Anxiety Delusions  Depression Hallucinations Homicidal ideation Medication stabilization Suicidal ideation  Estimated Length of Stay:  1-7 days   Recreational Therapy: Patient Stressors: N/A Patient Goal: Patient will engage in groups without prompting or encouragement from  LRT x3 group sessions within 5 recreation therapy group sessions.  Attendees: Patient: Lance [name] 11/11/2020 9:51 AM  Physician: Dr. Domingo Cocking, MD 11/11/2020 9:51 AM  Nursing: Graceann Congress, RN 11/11/2020 9:51 AM  RN Care Manager: 11/11/2020 9:51 AM  Social Worker: Assunta Curtis, MSW, LCSW 11/11/2020 9:51 AM  Recreational Therapist: Roanna Epley, Reather Converse, LRT 11/11/2020 9:51 AM  Other:  11/11/2020 9:51 AM  Other:  11/11/2020 9:51 AM  Other: 11/11/2020 9:51 AM    Scribe for Treatment Team: Rozann Lesches, LCSW 11/11/2020 9:51 AM

## 2020-11-11 NOTE — Progress Notes (Signed)
Recreation Therapy Notes  INPATIENT RECREATION TR PLAN  Patient Details Name: Lance [name] MRN: [id number] DOB: Mar 15, 1976 Today's Date: 11/11/2020  Rec Therapy Plan Is patient appropriate for Therapeutic Recreation?: Yes Treatment times per week: at least 3 Estimated Length of Stay: 5-7 days TR Treatment/Interventions: Group participation (Comment)  Discharge Criteria Pt will be discharged from therapy if:: Discharged Treatment plan/goals/alternatives discussed and agreed upon by:: Patient/family  Discharge Summary     Lance [name] 11/11/2020, 12:47 PM

## 2020-11-11 NOTE — BHH Suicide Risk Assessment (Signed)
B[facility] INPATIENT:  Family/Significant Other Suicide Prevention Education  Suicide Prevention Education:  Education Completed; [name], [phone] has been identified by the patient as the family member/significant other with whom the patient will be residing, and identified as the person(s) who will aid the patient in the event of a mental health crisis (suicidal ideations/suicide attempt).  With written consent from the patient, the family member/significant other has been provided the following suicide prevention education, prior to the and/or following the discharge of the patient.  The suicide prevention education provided includes the following:  Suicide risk factors  Suicide prevention and interventions  National Suicide Hotline telephone number  [facility] assessment telephone number  [facility] Emergency Assistance 911  [facility] and/or Residential Mobile Crisis Unit telephone number  Request made of family/significant other to:  Remove weapons (e.g., guns, rifles, knives), all items previously/currently identified as safety concern.    Remove drugs/medications (over-the-counter, prescriptions, illicit drugs), all items previously/currently identified as a safety concern.  The family member/significant other verbalizes understanding of the suicide prevention education information provided.  The family member/significant other agrees to remove the items of safety concern listed above.  Guardian reports that she will follow up with the patient's ACTT team on placement.  She reports that the ACTT team is with [facility]. She reports that the patient had stopped talking about placement so they allowed the conversation to end.  She reports that the patient had stopped talking about it so they (ACTT team and guardian) allowed the conversation to stop.  She reports that she expects the patient to return to his group home.   She provided verbal permission for  CSW to reach out to [facility], [phone] and [facility] ACTT time.  She asked that consents be faxed to [phone].   [name] 11/11/2020, 11:36 AM

## 2020-11-11 NOTE — ED Notes (Signed)
Patient stable in NAD. He is transferred to [facility] via NT and officer. He is calm and cooperative. Patient belongings went with patient to BMU. Report given to [facility] RN Britta Mccreedy.

## 2020-11-11 NOTE — Tx Team (Signed)
Initial Treatment Plan 11/11/2020 1:21 AM [name] UXL:[id number]    PATIENT STRESSORS: Educational concerns Medication change or noncompliance   PATIENT STRENGTHS: Communication skills Motivation for treatment/growth   PATIENT IDENTIFIED PROBLEMS: Depression  Anxiety                   DISCHARGE CRITERIA:  Ability to meet basic life and health needs Verbal commitment to aftercare and medication compliance  PRELIMINARY DISCHARGE PLAN: Outpatient therapy Return to previous living arrangement  PATIENT/FAMILY INVOLVEMENT: This treatment plan has been presented to and reviewed with the patient, Lance [name]. The patient has been given the opportunity to ask questions and make suggestions.  Elmyra Ricks, RN 11/11/2020, 1:21 AM

## 2020-11-12 DIAGNOSIS — F250 Schizoaffective disorder, bipolar type: Secondary | ICD-10-CM | POA: Diagnosis not present

## 2020-11-12 LAB — HEMOGLOBIN A1C
Hgb A1c MFr Bld: 5.4 % (ref 4.8–5.6)
Mean Plasma Glucose: 108 mg/dL

## 2020-11-12 LAB — CBC
HCT: 38.4 % — ABNORMAL LOW (ref 39.0–52.0)
Hemoglobin: 13.6 g/dL (ref 13.0–17.0)
MCH: 32 pg (ref 26.0–34.0)
MCHC: 35.4 g/dL (ref 30.0–36.0)
MCV: 90.4 fL (ref 80.0–100.0)
Platelets: 215 10*3/uL (ref 150–400)
RBC: 4.25 MIL/uL (ref 4.22–5.81)
RDW: 12.4 % (ref 11.5–15.5)
WBC: 4.5 10*3/uL (ref 4.0–10.5)
nRBC: 0 % (ref 0.0–0.2)

## 2020-11-12 LAB — GLUCOSE, CAPILLARY
Glucose-Capillary: 118 mg/dL — ABNORMAL HIGH (ref 70–99)
Glucose-Capillary: 141 mg/dL — ABNORMAL HIGH (ref 70–99)
Glucose-Capillary: 88 mg/dL (ref 70–99)
Glucose-Capillary: 112 mg/dL — ABNORMAL HIGH (ref 70–99)

## 2020-11-12 MED ORDER — HYDROXYZINE HCL 50 MG PO TABS
50.0000 mg | ORAL_TABLET | Freq: Every day | ORAL | Status: DC
  Administered 2020-11-12 – 2020-11-18 (×7): 50 mg via ORAL
  Filled 2020-11-12 (×7): qty 1

## 2020-11-12 NOTE — BHH Group Notes (Signed)
[facility] Group Notes:  (Nursing/MHT/Case Management/Adjunct)  Date:  11/12/2020  Time:  11:40 PM  Type of Therapy:  Group Therapy  Participation Level:  Active  Participation Quality:  Appropriate  Affect:  Appropriate  Cognitive:  Alert  Insight:  Good  Engagement in Group:  Engaged and goal is to get things together.  Modes of Intervention:  Support  Summary of Progress/Problems:  Lance [name] 11/12/2020, 11:40 PM

## 2020-11-12 NOTE — Progress Notes (Signed)
[facility] MD Progress Note  11/12/2020 11:23 AM Lance [name]  MRN:  [id number]   CC " I am severely depressed."  Subjective: 45 year old male with schizoaffective disorder bipolar type presenting with suicidal ideations to cut his wrists, and homicidal ideations toward his group home members with plan to stab them.  No acute events overnight, medication compliant, attending to ADLs.  Patient seen one-on-one this morning.  He was asleep in his room, which is unusual for him.  He states he is severely depressed, and continuing to feel like he is got a hurt himself and his group home members.  He continues to perseverate on need for any group home.  He was informed that I was in contact with his guardian yesterday.  He denies auditory or visual hallucinations. Principal Problem: Schizoaffective disorder, bipolar type (HCC) Diagnosis: Principal Problem:   Schizoaffective disorder, bipolar type (HCC) Active Problems:   Diabetes mellitus without complication (HCC)   Hyperlipidemia   Chronic anticoagulation   Tobacco use disorder  Total Time spent with patient: 20 minutes  Past Psychiatric History: See H&P  Past Medical History:  Past Medical History:  Diagnosis Date  . Depression   . Diabetes mellitus without complication (HCC)   . Hyperlipidemia   . Hypertension   . Lupus anticoagulant disorder (HCC) 06/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  . PE (pulmonary thromboembolism) (HCC)   . Schizo affective schizophrenia (HCC)   . Supratherapeutic INR 11/19/2016   History reviewed. No pertinent surgical history. Family History:  Family History  Problem Relation Age of Onset  . CAD Mother   . CAD Sister    Family Psychiatric  History: See H&P Social History:  Social History   Substance and Sexual Activity  Alcohol Use No   Comment: occassionally     Social History   Substance and Sexual Activity  Drug Use Not Currently  . Types: Marijuana   Comment: Last use 11/29/16     Social History   Socioeconomic History  . Marital status: Single    Spouse name: Not on file  . Number of children: Not on file  . Years of education: Not on file  . Highest education level: Not on file  Occupational History  . Not on file  Tobacco Use  . Smoking status: Current Every Day Smoker    Packs/day: 1.00    Years: 23.00    Pack years: 23.00    Types: Cigarettes  . Smokeless tobacco: Never Used  . Tobacco comment: will provide material  Substance and Sexual Activity  . Alcohol use: No    Comment: occassionally  . Drug use: Not Currently    Types: Marijuana    Comment: Last use 11/29/16  . Sexual activity: Never    Birth control/protection: None    Comment: occasional marijuana- none recently  Other Topics Concern  . Not on file  Social History Narrative   From a group home in [place]   Social Determinants of Health   Financial Resource Strain: Not on file  Food Insecurity: Not on file  Transportation Needs: Not on file  Physical Activity: Not on file  Stress: Not on file  Social Connections: Not on file   Additional Social History:                         Sleep: Fair  Appetite:  Fair  Current Medications: Current Facility-Administered Medications  Medication Dose Route Frequency Provider Last  Rate Last Admin  . acetaminophen (TYLENOL) tablet 650 mg  650 mg Oral Q6H PRN Dixon, Rashaun M, NP      . albuterol (VENTOLIN HFA) 108 (90 Base) MCG/ACT inhaler 1-2 puff  1-2 puff Inhalation Q6H PRN Dixon, Rashaun M, NP      . alum & mag hydroxide-simeth (MAALOX/MYLANTA) 200-200-20 MG/5ML suspension 30 mL  30 mL Oral Q4H PRN Dixon, Rashaun M, NP      . apixaban (ELIQUIS) tablet 5 mg  5 mg Oral BID Lerry Liner M, NP   5 mg at 11/12/20 0826  . cloZAPine (CLOZARIL) tablet 500 mg  500 mg Oral QHS Dixon, Rashaun M, NP   500 mg at 11/11/20 2104  . hydrOXYzine (ATARAX/VISTARIL) tablet 50 mg  50 mg Oral QHS [name], MD      . insulin aspart  (novoLOG) injection 0-15 Units  0-15 Units Subcutaneous TID WC [name], MD      . insulin aspart (novoLOG) injection 0-5 Units  0-5 Units Subcutaneous QHS [name], MD      . insulin glargine (LANTUS) injection 10 Units  10 Units Subcutaneous BID Jearld Lesch, NP   10 Units at 11/12/20 0826  . lithium carbonate (ESKALITH) CR tablet 450 mg  450 mg Oral QHS [name], MD   450 mg at 11/11/20 2104  . lithium carbonate (LITHOBID) CR tablet 300 mg  300 mg Oral q AM [name], MD   300 mg at 11/12/20 6237  . magnesium hydroxide (MILK OF MAGNESIA) suspension 30 mL  30 mL Oral Daily PRN Durwin Nora, Rashaun M, NP      . metFORMIN (GLUCOPHAGE) tablet 1,000 mg  1,000 mg Oral BID WC Dixon, Rashaun M, NP   1,000 mg at 11/12/20 0826  . OLANZapine (ZYPREXA) tablet 20 mg  20 mg Oral QHS Dixon, Rashaun M, NP   20 mg at 11/11/20 2104  . propranolol (INDERAL) tablet 30 mg  30 mg Oral QHS Dixon, Rashaun M, NP   30 mg at 11/11/20 2104  . sertraline (ZOLOFT) tablet 50 mg  50 mg Oral Daily Dixon, Rashaun M, NP   50 mg at 11/12/20 0826  . simvastatin (ZOCOR) tablet 40 mg  40 mg Oral q1800 Jearld Lesch, NP   40 mg at 11/11/20 1718  . traZODone (DESYREL) tablet 100 mg  100 mg Oral QHS [name], MD   100 mg at 11/11/20 2105    Lab Results:  Results for orders placed or performed during the hospital encounter of 11/11/20 (from the past 48 hour(s))  Glucose, capillary     Status: Abnormal   Collection Time: 11/11/20  7:31 AM  Result Value Ref Range   Glucose-Capillary 118 (H) 70 - 99 mg/dL    Comment: Glucose reference range applies only to samples taken after fasting for at least 8 hours.  Glucose, capillary     Status: None   Collection Time: 11/11/20 11:01 AM  Result Value Ref Range   Glucose-Capillary 99 70 - 99 mg/dL    Comment: Glucose reference range applies only to samples taken after fasting for at least 8 hours.  Glucose, capillary     Status: None   Collection Time:  11/11/20  3:56 PM  Result Value Ref Range   Glucose-Capillary 99 70 - 99 mg/dL    Comment: Glucose reference range applies only to samples taken after fasting for at least 8 hours.  Glucose, capillary  Status: None   Collection Time: 11/11/20  8:32 PM  Result Value Ref Range   Glucose-Capillary 94 70 - 99 mg/dL    Comment: Glucose reference range applies only to samples taken after fasting for at least 8 hours.   Comment 1 Notify RN   CBC     Status: Abnormal   Collection Time: 11/12/20  7:46 AM  Result Value Ref Range   WBC 4.5 4.0 - 10.5 K/uL   RBC 4.25 4.22 - 5.81 MIL/uL   Hemoglobin 13.6 13.0 - 17.0 g/dL   HCT 16.1 (L) 09.6 - 04.5 %   MCV 90.4 80.0 - 100.0 fL   MCH 32.0 26.0 - 34.0 pg   MCHC 35.4 30.0 - 36.0 g/dL   RDW 40.9 81.1 - 91.4 %   Platelets 215 150 - 400 K/uL   nRBC 0.0 0.0 - 0.2 %    Comment: Performed at [facility], [address] [place], [place] [address]  Glucose, capillary     Status: Abnormal   Collection Time: 11/12/20  7:50 AM  Result Value Ref Range   Glucose-Capillary 118 (H) 70 - 99 mg/dL    Comment: Glucose reference range applies only to samples taken after fasting for at least 8 hours.  Glucose, capillary     Status: Abnormal   Collection Time: 11/12/20 11:12 AM  Result Value Ref Range   Glucose-Capillary 141 (H) 70 - 99 mg/dL    Comment: Glucose reference range applies only to samples taken after fasting for at least 8 hours.    Blood Alcohol level:  Lab Results  Component Value Date   ETH <10 11/09/2020   ETH <10 09/19/2020    Metabolic Disorder Labs: Lab Results  Component Value Date   HGBA1C 5.1 09/20/2020   MPG 99.67 09/20/2020   MPG 105.41 07/16/2020   Lab Results  Component Value Date   PROLACTIN 2.0 (L) 08/31/2016   PROLACTIN 4.8 05/22/2016   Lab Results  Component Value Date   CHOL 157 09/20/2020   TRIG 447 (H) 09/20/2020   HDL 32 (L) 09/20/2020   CHOLHDL 4.9 09/20/2020   VLDL UNABLE TO CALCULATE IF  TRIGLYCERIDE OVER 400 mg/dL 62/13/0865   LDLCALC UNABLE TO CALCULATE IF TRIGLYCERIDE OVER 400 mg/dL 78/46/9629   LDLCALC 53 07/16/2020    Physical Findings: AIMS: Facial and Oral Movements Muscles of Facial Expression: None, normal Lips and Perioral Area: None, normal Jaw: None, normal Tongue: None, normal,Extremity Movements Upper (arms, wrists, hands, fingers): None, normal Lower (legs, knees, ankles, toes): None, normal, Trunk Movements Neck, shoulders, hips: None, normal, Overall Severity Severity of abnormal movements (highest score from questions above): None, normal Incapacitation due to abnormal movements: None, normal Patient's awareness of abnormal movements (rate only patient's report): No Awareness, Dental Status Current problems with teeth and/or dentures?: No Does patient usually wear dentures?: No  CIWA:    COWS:     Musculoskeletal: Strength & Muscle Tone: within normal limits Gait & Station: normal Patient leans: N/A  Psychiatric Specialty Exam:  Presentation  General Appearance: Disheveled  Eye Contact:Fair  Speech:Clear and Coherent  Speech Volume:Normal  Handedness:Right   Mood and Affect  Mood:Depressed; Dysphoric  Affect:Congruent; Flat   Thought Process  Thought Processes:Goal Directed  Descriptions of Associations:Intact  Orientation:Full (Time, Place and Person)  Thought Content:Perseveration  History of Schizophrenia/Schizoaffective disorder:Yes  Duration of Psychotic Symptoms:Greater than six months  Hallucinations:Hallucinations: Auditory  Ideas of Reference:Percusatory  Suicidal Thoughts:Suicidal Thoughts: Yes, Active SI Active Intent and/or  Plan: With Plan  Homicidal Thoughts:Homicidal Thoughts: Yes, Active HI Active Intent and/or Plan: With Plan   Sensorium  Memory:Immediate Fair; Recent Fair; Remote Fair  Judgment:Impaired  Insight:Lacking   Executive Functions  Concentration:Fair  Attention  Span:Fair  Recall:Fair  Fund of Knowledge:Fair  Language:Fair   Psychomotor Activity  Psychomotor Activity:Psychomotor Activity: Decreased   Assets  Assets:Communication Skills; Desire for Improvement; Financial Resources/Insurance; Housing; Resilience; Social Support   Sleep  Sleep:Sleep: Fair Number of Hours of Sleep: 7.25    Physical Exam: Physical Exam ROS Blood pressure (!) 104/50, pulse 60, temperature 97.7 F (36.5 C), temperature source Oral, resp. rate 18, height 5\' 10"  (1.778 m), weight 82.6 kg, SpO2 100 %. Body mass index is 26.13 kg/m.   Treatment Plan Summary: Daily contact with patient to assess and evaluate symptoms and progress in treatment and Medication management 1) Schizoaffective Disorder, depressive type- established problem, unstable, current suicidal thoughts - Continue Zoloft 100 mg daily, Zyprexa 20 mg QHS, Clozapine 500 mg QHS, trazodone 100 mg QHS, propranolol 30 mg QHS for anxiety. ContinueLithium 300 mg qam, 450 mg nightly (lithium level elevated at 1.3 on admission on 450 mg BID dosing).    2) Diabetes Melltius Type 2 - Continue Metformin 1000 mg BID, lantus 10 mg BID, cbq with meals and bedtime with sliding scale coverage   3) HTN- established problem, stable - Hold lisinipril 2.5 mg daily for now, currently hypotensive 104/50  4) Long-term anticoagulation- continue apixiban 5 mg BID  5) Hyperlipidemia- continue Simvastin 40 mg daily at 6PM. Jesse SansMegan M Jago Carton, MD 11/12/2020, 11:23 AM

## 2020-11-12 NOTE — [facility] Group Notes (Signed)
LCSW Group Therapy Note     11/12/2020 2:47 PM     Type of Therapy and Topic:  Group Therapy:  Overcoming Obstacles     Participation Level:  None     Description of Group:     In this group patients will be encouraged to explore what they see as obstacles to their own wellness and recovery. They will be guided to discuss their thoughts, feelings, and behaviors related to these obstacles. The group will process together ways to cope with barriers, with attention given to specific choices patients can make. Each patient will be challenged to identify changes they are motivated to make in order to overcome their obstacles. This group will be process-oriented, with patients participating in exploration of their own experiences as well as giving and receiving support and challenge from other group members.     Therapeutic Goals:  1.    Patient will identify personal and current obstacles as they relate to admission.  2.    Patient will identify barriers that currently interfere with their wellness or overcoming obstacles.  3.    Patient will identify feelings, thought process and behaviors related to these barriers.  4.    Patient will identify two changes they are willing to make to overcome these obstacles:        Summary of Patient Progress: Patient attended group, but did not participate, and he left group early. He attempted to return to group but CSW declined because the session was ending soon.      Therapeutic Modalities:    Cognitive Behavioral Therapy  Solution Focused Therapy  Motivational Interviewing  Relapse Prevention Therapy     Aylene Acoff [place], MSW, LCSW-A  11/12/2020 2:47 PM

## 2020-11-12 NOTE — Progress Notes (Signed)
Patient is assessed while he is in bed this morning. Patient states, "I am tired". He initially refuses to get out of bed for vitals and to get his blood sugar checked. However, he eventually will come out to get his sugar checked before breakfast. After breakfast, patient still refusing to get up for medications and vitals. He states "I am too depressed". When asked about SI and HI, he says, "maybe". Patient was compliant with medications when brought to his room. He has not left the room except for breakfast this morning. No pacing observed this morning. Support and encouragement provided. Patient remains safe on the unit at this time and q15 min safety checks have been maintained.

## 2020-11-12 NOTE — Plan of Care (Signed)
Patient stated he was feeling better but states it will get worse if he has to go back to that same group home   Problem: Education: Goal: Emotional status will improve Outcome: Progressing Goal: Mental status will improve Outcome: Progressing

## 2020-11-12 NOTE — Progress Notes (Signed)
Patient calm and cooperative during assessment denying SI/AVH. Patient states he has thoughts of wanting to hurt people in the group home. Patient given education and support. Patient paces the unit but doesn't cause any issues. Patient observed interacting appropriately with staff and peers on the unit. Patient compliant with medication administration per MD orders. Pt being monitored Q 15 minutes for safety per unit protocol. Pt remains safe on the unit. 

## 2020-11-12 NOTE — Progress Notes (Signed)
Recreation Therapy Notes   Date: 11/12/2020  Time: 10:00 am   Location: Courtyard   Behavioral response: N/A   Intervention Topic: Anger Management     Discussion/Intervention: Patient did not attend group.   Clinical Observations/Feedback:  Patient did not attend group.   Marquice Uddin LRT/CTRS        Marckus Hanover 11/12/2020 11:47 AM

## 2020-11-13 DIAGNOSIS — F250 Schizoaffective disorder, bipolar type: Secondary | ICD-10-CM | POA: Diagnosis not present

## 2020-11-13 LAB — GLUCOSE, CAPILLARY
Glucose-Capillary: 109 mg/dL — ABNORMAL HIGH (ref 70–99)
Glucose-Capillary: 77 mg/dL (ref 70–99)
Glucose-Capillary: 99 mg/dL (ref 70–99)
Glucose-Capillary: 132 mg/dL — ABNORMAL HIGH (ref 70–99)

## 2020-11-13 MED ORDER — PROPRANOLOL HCL 20 MG PO TABS
20.0000 mg | ORAL_TABLET | Freq: Every day | ORAL | Status: DC
  Administered 2020-11-13 – 2020-11-18 (×6): 20 mg via ORAL
  Filled 2020-11-13 (×6): qty 1

## 2020-11-13 NOTE — Plan of Care (Signed)
  Problem: Education: Goal: Emotional status will improve Outcome: Progressing   Problem: Education: Goal: Mental status will improve Outcome: Progressing   Problem: Education: Goal: Verbalization of understanding the information provided will improve Outcome: Progressing   Problem: Education: Goal: Knowledge of the prescribed therapeutic regimen will improve Outcome: Progressing

## 2020-11-13 NOTE — Plan of Care (Signed)
  Problem: Coping Skills Goal: STG - Patient will identify 3 positive coping skills strategies to use for SI post d/c within 5 recreation therapy group sessions Description: STG - Patient will identify 3 positive coping skills strategies to use for SI post d/c within 5 recreation therapy group sessions Outcome: Not Progressing

## 2020-11-13 NOTE — Plan of Care (Signed)
Pt rates depression 7/10 and anxiety 6/10. Pt denies SI, HI and AVH. Pt denies SI but says he has HI towards "those jerks over there" referring to the group home he came from. Pt says he has negative AVH. Pt was educated on care plan and verbalizes understanding. Torrie Mayers RN Problem: Education: Goal: Knowledge of [facility] Education information/materials will improve Outcome: Progressing Goal: Emotional status will improve Outcome: Progressing Goal: Mental status will improve Outcome: Progressing Goal: Verbalization of understanding the information provided will improve Outcome: Progressing   Problem: Safety: Goal: Periods of time without injury will increase Outcome: Progressing   Problem: Education: Goal: Utilization of techniques to improve thought processes will improve Outcome: Progressing Goal: Knowledge of the prescribed therapeutic regimen will improve Outcome: Progressing   Problem: Safety: Goal: Ability to disclose and discuss suicidal ideas will improve Outcome: Progressing Goal: Ability to identify and utilize support systems that promote safety will improve Outcome: Progressing

## 2020-11-13 NOTE — Progress Notes (Signed)
Pt rates both anxiety and depression 5/10. Pt has a sullen affect. Pt says that he is still homicidal and "that will never change". Pt denies AVH and SI. Torrie Mayers RN

## 2020-11-13 NOTE — [facility] Group Notes (Signed)
LCSW Group Therapy Note  11/13/2020 2:15 PM  Type of Therapy/Topic:  Group Therapy:  Emotion Regulation  Participation Level:  Active   Description of Group:   The purpose of this group is to assist patients in learning to regulate negative emotions and experience positive emotions. Patients will be guided to discuss ways in which they have been vulnerable to their negative emotions. These vulnerabilities will be juxtaposed with experiences of positive emotions or situations, and patients will be challenged to use positive emotions to combat negative ones. Special emphasis will be placed on coping with negative emotions in conflict situations, and patients will process healthy conflict resolution skills.  Therapeutic Goals: 1. Patient will identify two positive emotions or experiences to reflect on in order to balance out negative emotions 2. Patient will label two or more emotions that they find the most difficult to experience 3. Patient will demonstrate positive conflict resolution skills through discussion and/or role plays  Summary of Patient Progress: Patient was present for the entirety of the group. He spoke about his strong negative emotion as being stress and was able to identify physical responses he notices with this emotion including mind racing and body restlessness. Patient was able to identify how he dealt with stress prior to coming into treatment but appeared open to suggestions/options presented by peers, of different ways to deal with these issues.  Therapeutic Modalities:   Cognitive Behavioral Therapy Feelings Identification Dialectical Behavioral Therapy  Vilma Meckel. Algis Greenhouse, MSW, LCSW, [name] 11/13/2020 2:15 PM

## 2020-11-13 NOTE — Progress Notes (Signed)
Recreation Therapy Notes   Date: 11/13/2020  Time: 10:00 am   Location: Craft-room   Behavioral response: N/A   Intervention Topic: Relaxation   Discussion/Intervention: Patient did not attend group.   Clinical Observations/Feedback:  Patient did not attend group.   Lexia Vandevender LRT/CTRS         Adriyanna Christians 11/13/2020 12:57 PM

## 2020-11-13 NOTE — Progress Notes (Signed)
[facility] MD Progress Note  11/13/2020 2:55 PM Lance [name]  MRN:  [id number]   CC " I am tired."  Subjective: 45 year old male with schizoaffective disorder bipolar type presenting with suicidal ideations to cut his wrists, and homicidal ideations toward his group home members with plan to stab them.  No acute events overnight, medication compliant, attending to ADLs.  This morning patient noted to be hypotensive at 90/45 with a low pulse of 58.  He denies any suicidal ideations homicidal ideations or auditory hallucinations.  He continues to express some homicidal ideations toward his group home members.  He has been taking his medications without any side effects aside from some morning fatigue.  Principal Problem: Schizoaffective disorder, bipolar type (HCC) Diagnosis: Principal Problem:   Schizoaffective disorder, bipolar type (HCC) Active Problems:   Diabetes mellitus without complication (HCC)   Hyperlipidemia   Chronic anticoagulation   Tobacco use disorder  Total Time spent with patient: 20 minutes  Past Psychiatric History: See H&P  Past Medical History:  Past Medical History:  Diagnosis Date  . Depression   . Diabetes mellitus without complication (HCC)   . Hyperlipidemia   . Hypertension   . Lupus anticoagulant disorder (HCC) 06/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  . PE (pulmonary thromboembolism) (HCC)   . Schizo affective schizophrenia (HCC)   . Supratherapeutic INR 11/19/2016   History reviewed. No pertinent surgical history. Family History:  Family History  Problem Relation Age of Onset  . CAD Mother   . CAD Sister    Family Psychiatric  History: See H&P Social History:  Social History   Substance and Sexual Activity  Alcohol Use No   Comment: occassionally     Social History   Substance and Sexual Activity  Drug Use Not Currently  . Types: Marijuana   Comment: Last use 11/29/16    Social History   Socioeconomic History  . Marital status:  Single    Spouse name: Not on file  . Number of children: Not on file  . Years of education: Not on file  . Highest education level: Not on file  Occupational History  . Not on file  Tobacco Use  . Smoking status: Current Every Day Smoker    Packs/day: 1.00    Years: 23.00    Pack years: 23.00    Types: Cigarettes  . Smokeless tobacco: Never Used  . Tobacco comment: will provide material  Substance and Sexual Activity  . Alcohol use: No    Comment: occassionally  . Drug use: Not Currently    Types: Marijuana    Comment: Last use 11/29/16  . Sexual activity: Never    Birth control/protection: None    Comment: occasional marijuana- none recently  Other Topics Concern  . Not on file  Social History Narrative   From a group home in [place]   Social Determinants of Health   Financial Resource Strain: Not on file  Food Insecurity: Not on file  Transportation Needs: Not on file  Physical Activity: Not on file  Stress: Not on file  Social Connections: Not on file   Additional Social History:                         Sleep: Fair  Appetite:  Fair  Current Medications: Current Facility-Administered Medications  Medication Dose Route Frequency Provider Last Rate Last Admin  . acetaminophen (TYLENOL) tablet 650 mg  650 mg Oral Q6H PRN  Jearld Lesch, NP      . albuterol (VENTOLIN HFA) 108 (90 Base) MCG/ACT inhaler 1-2 puff  1-2 puff Inhalation Q6H PRN Dixon, Rashaun M, NP      . alum & mag hydroxide-simeth (MAALOX/MYLANTA) 200-200-20 MG/5ML suspension 30 mL  30 mL Oral Q4H PRN Dixon, Rashaun M, NP      . apixaban (ELIQUIS) tablet 5 mg  5 mg Oral BID Lerry Liner M, NP   5 mg at 11/13/20 0803  . cloZAPine (CLOZARIL) tablet 500 mg  500 mg Oral QHS Dixon, Rashaun M, NP   500 mg at 11/12/20 2113  . hydrOXYzine (ATARAX/VISTARIL) tablet 50 mg  50 mg Oral QHS [name], MD   50 mg at 11/12/20 2114  . insulin aspart (novoLOG) injection 0-15 Units  0-15 Units  Subcutaneous TID WC [name], MD   2 Units at 11/12/20 1125  . insulin aspart (novoLOG) injection 0-5 Units  0-5 Units Subcutaneous QHS [name], MD      . insulin glargine (LANTUS) injection 10 Units  10 Units Subcutaneous BID Jearld Lesch, NP   10 Units at 11/13/20 0803  . lithium carbonate (ESKALITH) CR tablet 450 mg  450 mg Oral QHS [name], MD   450 mg at 11/12/20 2113  . lithium carbonate (LITHOBID) CR tablet 300 mg  300 mg Oral q AM [name], MD   300 mg at 11/13/20 0805  . magnesium hydroxide (MILK OF MAGNESIA) suspension 30 mL  30 mL Oral Daily PRN Durwin Nora, Rashaun M, NP      . metFORMIN (GLUCOPHAGE) tablet 1,000 mg  1,000 mg Oral BID WC Dixon, Rashaun M, NP   1,000 mg at 11/13/20 0803  . OLANZapine (ZYPREXA) tablet 20 mg  20 mg Oral QHS Dixon, Rashaun M, NP   20 mg at 11/12/20 2114  . propranolol (INDERAL) tablet 20 mg  20 mg Oral QHS [name], MD      . sertraline (ZOLOFT) tablet 50 mg  50 mg Oral Daily Lerry Liner M, NP   50 mg at 11/13/20 0803  . simvastatin (ZOCOR) tablet 40 mg  40 mg Oral q1800 Jearld Lesch, NP   40 mg at 11/12/20 1700  . traZODone (DESYREL) tablet 100 mg  100 mg Oral QHS [name], MD   100 mg at 11/12/20 2114    Lab Results:  Results for orders placed or performed during the hospital encounter of 11/11/20 (from the past 48 hour(s))  Glucose, capillary     Status: None   Collection Time: 11/11/20  3:56 PM  Result Value Ref Range   Glucose-Capillary 99 70 - 99 mg/dL    Comment: Glucose reference range applies only to samples taken after fasting for at least 8 hours.  Glucose, capillary     Status: None   Collection Time: 11/11/20  8:32 PM  Result Value Ref Range   Glucose-Capillary 94 70 - 99 mg/dL    Comment: Glucose reference range applies only to samples taken after fasting for at least 8 hours.   Comment 1 Notify RN   CBC     Status: Abnormal   Collection Time: 11/12/20  7:46 AM  Result Value Ref  Range   WBC 4.5 4.0 - 10.5 K/uL   RBC 4.25 4.22 - 5.81 MIL/uL   Hemoglobin 13.6 13.0 - 17.0 g/dL   HCT 41.9 (L) 62.2 - 29.7 %   MCV 90.4 80.0 -  100.0 fL   MCH 32.0 26.0 - 34.0 pg   MCHC 35.4 30.0 - 36.0 g/dL   RDW 16.112.4 09.611.5 - 04.515.5 %   Platelets 215 150 - 400 K/uL   nRBC 0.0 0.0 - 0.2 %    Comment: Performed at [facility], [address] [place], [place] [address]  Hemoglobin A1c     Status: None   Collection Time: 11/12/20  7:46 AM  Result Value Ref Range   Hgb A1c MFr Bld 5.4 4.8 - 5.6 %    Comment: (NOTE)         Prediabetes: 5.7 - 6.4         Diabetes: >6.4         Glycemic control for adults with diabetes: <7.0    Mean Plasma Glucose 108 mg/dL    Comment: (NOTE) Performed At: [facility] [address] [place], [place] [id number] Jolene SchimkeNagendra Sanjai MD AO:[id number]:[phone]   Glucose, capillary     Status: Abnormal   Collection Time: 11/12/20  7:50 AM  Result Value Ref Range   Glucose-Capillary 118 (H) 70 - 99 mg/dL    Comment: Glucose reference range applies only to samples taken after fasting for at least 8 hours.  Glucose, capillary     Status: Abnormal   Collection Time: 11/12/20 11:12 AM  Result Value Ref Range   Glucose-Capillary 141 (H) 70 - 99 mg/dL    Comment: Glucose reference range applies only to samples taken after fasting for at least 8 hours.  Glucose, capillary     Status: None   Collection Time: 11/12/20  4:15 PM  Result Value Ref Range   Glucose-Capillary 88 70 - 99 mg/dL    Comment: Glucose reference range applies only to samples taken after fasting for at least 8 hours.  Glucose, capillary     Status: Abnormal   Collection Time: 11/12/20  7:59 PM  Result Value Ref Range   Glucose-Capillary 112 (H) 70 - 99 mg/dL    Comment: Glucose reference range applies only to samples taken after fasting for at least 8 hours.   Comment 1 Notify RN   Glucose, capillary     Status: Abnormal   Collection Time: 11/13/20  7:02 AM  Result Value Ref Range    Glucose-Capillary 109 (H) 70 - 99 mg/dL    Comment: Glucose reference range applies only to samples taken after fasting for at least 8 hours.   Comment 1 Notify RN   Glucose, capillary     Status: None   Collection Time: 11/13/20 11:11 AM  Result Value Ref Range   Glucose-Capillary 77 70 - 99 mg/dL    Comment: Glucose reference range applies only to samples taken after fasting for at least 8 hours.    Blood Alcohol level:  Lab Results  Component Value Date   ETH <10 11/09/2020   ETH <10 09/19/2020    Metabolic Disorder Labs: Lab Results  Component Value Date   HGBA1C 5.4 11/12/2020   MPG 108 11/12/2020   MPG 99.67 09/20/2020   Lab Results  Component Value Date   PROLACTIN 2.0 (L) 08/31/2016   PROLACTIN 4.8 05/22/2016   Lab Results  Component Value Date   CHOL 157 09/20/2020   TRIG 447 (H) 09/20/2020   HDL 32 (L) 09/20/2020   CHOLHDL 4.9 09/20/2020   VLDL UNABLE TO CALCULATE IF TRIGLYCERIDE OVER 400 mg/dL 96/29/528404/01/2021   LDLCALC UNABLE TO CALCULATE IF TRIGLYCERIDE OVER 400 mg/dL 13/24/401004/01/2021   LDLCALC 53  07/16/2020    Physical Findings: AIMS: Facial and Oral Movements Muscles of Facial Expression: None, normal Lips and Perioral Area: None, normal Jaw: None, normal Tongue: None, normal,Extremity Movements Upper (arms, wrists, hands, fingers): None, normal Lower (legs, knees, ankles, toes): None, normal, Trunk Movements Neck, shoulders, hips: None, normal, Overall Severity Severity of abnormal movements (highest score from questions above): None, normal Incapacitation due to abnormal movements: None, normal Patient's awareness of abnormal movements (rate only patient's report): No Awareness, Dental Status Current problems with teeth and/or dentures?: No Does patient usually wear dentures?: No  CIWA:    COWS:     Musculoskeletal: Strength & Muscle Tone: within normal limits Gait & Station: normal Patient leans: N/A  Psychiatric Specialty Exam:  Presentation   General Appearance: Disheveled  Eye Contact:Fair  Speech:Clear and Coherent  Speech Volume:Normal  Handedness:Right   Mood and Affect  Mood:Depressed; Dysphoric  Affect:Congruent; Flat   Thought Process  Thought Processes:Goal Directed  Descriptions of Associations:Intact  Orientation:Full (Time, Place and Person)  Thought Content:Perseveration  History of Schizophrenia/Schizoaffective disorder:Yes  Duration of Psychotic Symptoms:Greater than six months  Hallucinations:Hallucinations: Auditory  Ideas of Reference:Percusatory  Suicidal Thoughts:Suicidal Thoughts: Yes, Active  Homicidal Thoughts:Homicidal Thoughts: Yes, Active   Sensorium  Memory:Immediate Fair; Recent Fair; Remote Fair  Judgment:Impaired  Insight:Lacking   Executive Functions  Concentration:Fair  Attention Span:Fair  Recall:Fair  Fund of Knowledge:Fair  Language:Fair   Psychomotor Activity  Psychomotor Activity:Psychomotor Activity: Decreased   Assets  Assets:Communication Skills; Desire for Improvement; Financial Resources/Insurance; Housing; Resilience; Social Support   Sleep  Sleep:Good, 8.25 hours   Physical Exam: Physical Exam  ROS  Blood pressure (!) 90/45, pulse (!) 58, temperature 98 F (36.7 C), temperature source Oral, resp. rate 18, height 5\' 10"  (1.778 m), weight 82.6 kg, SpO2 100 %. Body mass index is 26.13 kg/m.   Treatment Plan Summary: Daily contact with patient to assess and evaluate symptoms and progress in treatment and Medication management 1) Schizoaffective Disorder, depressive type- established problem, unstable, current suicidal thoughts - Continue Zoloft 100 mg daily, Zyprexa 20 mg QHS, Clozapine 500 mg QHS, trazodone 100 mg QHS. Decrease propranolol 20 mg QHS for anxiety. Continue Lithium 300 mg qam, 450 mg nightly (lithium level elevated at 1.3 on admission on 450 mg BID dosing).    2) Diabetes Melltius Type 2 - Continue Metformin  1000 mg BID, lantus 10 mg BID, cbq with meals and bedtime with sliding scale coverage   3) HTN- established problem, stable - Hold lisinipril 2.5 mg daily for now, currently hypotensive 90/45  4) Long-term anticoagulation- continue apixiban 5 mg BID  5) Hyperlipidemia- continue Simvastin 40 mg daily at 6PM. , MD 11/13/2020, 2:55 PM

## 2020-11-14 LAB — GLUCOSE, CAPILLARY
Glucose-Capillary: 120 mg/dL — ABNORMAL HIGH (ref 70–99)
Glucose-Capillary: 99 mg/dL (ref 70–99)
Glucose-Capillary: 95 mg/dL (ref 70–99)
Glucose-Capillary: 84 mg/dL (ref 70–99)

## 2020-11-14 NOTE — [facility] Group Notes (Signed)
LCSW Group Therapy Note  11/14/2020 2:26 PM  Type of Therapy/Topic:  Group Therapy:  Balance in Life  Participation Level:  Did Not Attend  Description of Group:    This group will address the concept of balance and how it feels and looks when one is unbalanced. Patients will be encouraged to process areas in their lives that are out of balance and identify reasons for remaining unbalanced. Facilitators will guide patients in utilizing problem-solving interventions to address and correct the stressor making their life unbalanced. Understanding and applying boundaries will be explored and addressed for obtaining and maintaining a balanced life. Patients will be encouraged to explore ways to assertively make their unbalanced needs known to significant others in their lives, using other group members and facilitator for support and feedback.  Therapeutic Goals: 1. Patient will identify two or more emotions or situations they have that consume much of in their lives. 2. Patient will identify signs/triggers that life has become out of balance:  3. Patient will identify two ways to set boundaries in order to achieve balance in their lives:  4. Patient will demonstrate ability to communicate their needs through discussion and/or role plays  Summary of Patient Progress: X  Therapeutic Modalities:   Cognitive Behavioral Therapy Solution-Focused Therapy Assertiveness Training  Penni Homans MSW, LCSW 11/14/2020 2:26 PM

## 2020-11-14 NOTE — Progress Notes (Signed)
Recreation Therapy Notes  Date: 11/14/2020  Time: 10:00 am   Location: Craft room  Behavioral response: N/A   Intervention Topic: Communication   Discussion/Intervention: Patient did not attend group.   Clinical Observations/Feedback:  Patient did not attend group.   Alayna Mabe LRT/CTRS        Ebunoluwa Gernert 11/14/2020 11:58 AM 

## 2020-11-14 NOTE — Progress Notes (Signed)
[facility] MD Progress Note  11/14/2020 1:33 PM Lance [name]  MRN:  [id number]   CC " I need a new place to stay."  Subjective: 45 year old male with schizoaffective disorder bipolar type presenting with suicidal ideations to cut his wrists, and homicidal ideations toward his group home members with plan to stab them.  No acute events overnight, medication compliant, attending to ADLs.  This morning patient reports ongoing depression and anxiety. He continues to express homicidal ideations toward his group home owners with plan to stab them. Denies any suicidal ideations, auditory hallucinations, or visual hallucinations. Blood pressure and pulse improved today. He denies any medication side effects.  Principal Problem: Schizoaffective disorder, bipolar type (HCC) Diagnosis: Principal Problem:   Schizoaffective disorder, bipolar type (HCC) Active Problems:   Diabetes mellitus without complication (HCC)   Hyperlipidemia   Chronic anticoagulation   Tobacco use disorder  Total Time spent with patient: 20 minutes  Past Psychiatric History: See H&P  Past Medical History:  Past Medical History:  Diagnosis Date  . Depression   . Diabetes mellitus without complication (HCC)   . Hyperlipidemia   . Hypertension   . Lupus anticoagulant disorder (HCC) 06/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  . PE (pulmonary thromboembolism) (HCC)   . Schizo affective schizophrenia (HCC)   . Supratherapeutic INR 11/19/2016   History reviewed. No pertinent surgical history. Family History:  Family History  Problem Relation Age of Onset  . CAD Mother   . CAD Sister    Family Psychiatric  History: See H&P Social History:  Social History   Substance and Sexual Activity  Alcohol Use No   Comment: occassionally     Social History   Substance and Sexual Activity  Drug Use Not Currently  . Types: Marijuana   Comment: Last use 11/29/16    Social History   Socioeconomic History  . Marital status:  Single    Spouse name: Not on file  . Number of children: Not on file  . Years of education: Not on file  . Highest education level: Not on file  Occupational History  . Not on file  Tobacco Use  . Smoking status: Current Every Day Smoker    Packs/day: 1.00    Years: 23.00    Pack years: 23.00    Types: Cigarettes  . Smokeless tobacco: Never Used  . Tobacco comment: will provide material  Substance and Sexual Activity  . Alcohol use: No    Comment: occassionally  . Drug use: Not Currently    Types: Marijuana    Comment: Last use 11/29/16  . Sexual activity: Never    Birth control/protection: None    Comment: occasional marijuana- none recently  Other Topics Concern  . Not on file  Social History Narrative   From a group home in [place]   Social Determinants of Health   Financial Resource Strain: Not on file  Food Insecurity: Not on file  Transportation Needs: Not on file  Physical Activity: Not on file  Stress: Not on file  Social Connections: Not on file   Additional Social History:    Sleep: Fair  Appetite:  Fair  Current Medications: Current Facility-Administered Medications  Medication Dose Route Frequency Provider Last Rate Last Admin  . acetaminophen (TYLENOL) tablet 650 mg  650 mg Oral Q6H PRN Dixon, Rashaun M, NP      . albuterol (VENTOLIN HFA) 108 (90 Base) MCG/ACT inhaler 1-2 puff  1-2 puff Inhalation Q6H PRN Dixon,  Elray Buba, NP      . alum & mag hydroxide-simeth (MAALOX/MYLANTA) 200-200-20 MG/5ML suspension 30 mL  30 mL Oral Q4H PRN Dixon, Rashaun M, NP      . apixaban (ELIQUIS) tablet 5 mg  5 mg Oral BID Jearld Lesch, NP   5 mg at 11/14/20 0736  . cloZAPine (CLOZARIL) tablet 500 mg  500 mg Oral QHS Dixon, Rashaun M, NP   500 mg at 11/13/20 2114  . hydrOXYzine (ATARAX/VISTARIL) tablet 50 mg  50 mg Oral QHS [name], MD   50 mg at 11/13/20 2113  . insulin aspart (novoLOG) injection 0-15 Units  0-15 Units Subcutaneous TID WC [name], MD   2 Units at 11/12/20 1125  . insulin aspart (novoLOG) injection 0-5 Units  0-5 Units Subcutaneous QHS [name], MD      . insulin glargine (LANTUS) injection 10 Units  10 Units Subcutaneous BID Jearld Lesch, NP   10 Units at 11/14/20 0744  . lithium carbonate (ESKALITH) CR tablet 450 mg  450 mg Oral QHS [name], MD   450 mg at 11/13/20 2112  . lithium carbonate (LITHOBID) CR tablet 300 mg  300 mg Oral q AM [name], MD   300 mg at 11/14/20 0736  . magnesium hydroxide (MILK OF MAGNESIA) suspension 30 mL  30 mL Oral Daily PRN Dixon, Rashaun M, NP      . metFORMIN (GLUCOPHAGE) tablet 1,000 mg  1,000 mg Oral BID WC Dixon, Rashaun M, NP   1,000 mg at 11/14/20 0736  . OLANZapine (ZYPREXA) tablet 20 mg  20 mg Oral QHS Dixon, Rashaun M, NP   20 mg at 11/13/20 2112  . propranolol (INDERAL) tablet 20 mg  20 mg Oral QHS [name], MD   20 mg at 11/13/20 2116  . sertraline (ZOLOFT) tablet 50 mg  50 mg Oral Daily Dixon, Rashaun M, NP   50 mg at 11/14/20 0736  . simvastatin (ZOCOR) tablet 40 mg  40 mg Oral q1800 Jearld Lesch, NP   40 mg at 11/13/20 1709  . traZODone (DESYREL) tablet 100 mg  100 mg Oral QHS [name], MD   100 mg at 11/13/20 2113    Lab Results:  Results for orders placed or performed during the hospital encounter of 11/11/20 (from the past 48 hour(s))  Glucose, capillary     Status: None   Collection Time: 11/12/20  4:15 PM  Result Value Ref Range   Glucose-Capillary 88 70 - 99 mg/dL    Comment: Glucose reference range applies only to samples taken after fasting for at least 8 hours.  Glucose, capillary     Status: Abnormal   Collection Time: 11/12/20  7:59 PM  Result Value Ref Range   Glucose-Capillary 112 (H) 70 - 99 mg/dL    Comment: Glucose reference range applies only to samples taken after fasting for at least 8 hours.   Comment 1 Notify RN   Glucose, capillary     Status: Abnormal   Collection Time: 11/13/20  7:02 AM  Result  Value Ref Range   Glucose-Capillary 109 (H) 70 - 99 mg/dL    Comment: Glucose reference range applies only to samples taken after fasting for at least 8 hours.   Comment 1 Notify RN   Glucose, capillary     Status: None   Collection Time: 11/13/20 11:11 AM  Result Value Ref Range   Glucose-Capillary 77 70 -  99 mg/dL    Comment: Glucose reference range applies only to samples taken after fasting for at least 8 hours.  Glucose, capillary     Status: None   Collection Time: 11/13/20  3:58 PM  Result Value Ref Range   Glucose-Capillary 99 70 - 99 mg/dL    Comment: Glucose reference range applies only to samples taken after fasting for at least 8 hours.  Glucose, capillary     Status: Abnormal   Collection Time: 11/13/20  9:08 PM  Result Value Ref Range   Glucose-Capillary 132 (H) 70 - 99 mg/dL    Comment: Glucose reference range applies only to samples taken after fasting for at least 8 hours.  Glucose, capillary     Status: Abnormal   Collection Time: 11/14/20  7:27 AM  Result Value Ref Range   Glucose-Capillary 120 (H) 70 - 99 mg/dL    Comment: Glucose reference range applies only to samples taken after fasting for at least 8 hours.  Glucose, capillary     Status: None   Collection Time: 11/14/20 11:16 AM  Result Value Ref Range   Glucose-Capillary 99 70 - 99 mg/dL    Comment: Glucose reference range applies only to samples taken after fasting for at least 8 hours.    Blood Alcohol level:  Lab Results  Component Value Date   ETH <10 11/09/2020   ETH <10 09/19/2020    Metabolic Disorder Labs: Lab Results  Component Value Date   HGBA1C 5.4 11/12/2020   MPG 108 11/12/2020   MPG 99.67 09/20/2020   Lab Results  Component Value Date   PROLACTIN 2.0 (L) 08/31/2016   PROLACTIN 4.8 05/22/2016   Lab Results  Component Value Date   CHOL 157 09/20/2020   TRIG 447 (H) 09/20/2020   HDL 32 (L) 09/20/2020   CHOLHDL 4.9 09/20/2020   VLDL UNABLE TO CALCULATE IF TRIGLYCERIDE OVER  400 mg/dL 67/34/1937   LDLCALC UNABLE TO CALCULATE IF TRIGLYCERIDE OVER 400 mg/dL 90/24/0973   LDLCALC 53 07/16/2020    Physical Findings: AIMS: Facial and Oral Movements Muscles of Facial Expression: None, normal Lips and Perioral Area: None, normal Jaw: None, normal Tongue: None, normal,Extremity Movements Upper (arms, wrists, hands, fingers): None, normal Lower (legs, knees, ankles, toes): None, normal, Trunk Movements Neck, shoulders, hips: None, normal, Overall Severity Severity of abnormal movements (highest score from questions above): None, normal Incapacitation due to abnormal movements: None, normal Patient's awareness of abnormal movements (rate only patient's report): No Awareness, Dental Status Current problems with teeth and/or dentures?: No Does patient usually wear dentures?: No  CIWA:    COWS:     Musculoskeletal: Strength & Muscle Tone: within normal limits Gait & Station: normal Patient leans: N/A  Psychiatric Specialty Exam:  Presentation  General Appearance: Disheveled  Eye Contact:Fair  Speech:Clear and Coherent  Speech Volume:Normal  Handedness:Right   Mood and Affect  Mood:Depressed; Dysphoric  Affect:Congruent; Flat   Thought Process  Thought Processes:Goal Directed  Descriptions of Associations:Intact  Orientation:Full (Time, Place and Person)  Thought Content:Perseveration  History of Schizophrenia/Schizoaffective disorder:Yes  Duration of Psychotic Symptoms:Greater than six months  Hallucinations:Denies Ideas of Reference:Percusatory  Suicidal Thoughts:Denies Homicidal Thoughts:Yes with plan  Sensorium  Memory:Immediate Fair; Recent Fair; Remote Fair  Judgment:Impaired  Insight:Lacking   Executive Functions  Concentration:Fair  Attention Span:Fair  Recall:Fair  Fund of Knowledge:Fair  Language:Fair   Psychomotor Activity  Psychomotor Activity:No data recorded   Assets  Assets:Communication Skills;  Desire for Improvement; Financial Resources/Insurance; Housing; Resilience;  Social Support   Sleep  Sleep:Fair, 7.45 hours   Physical Exam: Physical Exam  ROS  Blood pressure 126/79, pulse 82, temperature 98 F (36.7 C), temperature source Oral, resp. rate 18, height 5\' 10"  (1.778 m), weight 82.6 kg, SpO2 100 %. Body mass index is 26.13 kg/m.   Treatment Plan Summary: Daily contact with patient to assess and evaluate symptoms and progress in treatment and Medication management 1) Schizoaffective Disorder, depressive type- established problem, unstable, current suicidal thoughts - Continue Zoloft 100 mg daily, Zyprexa 20 mg QHS, Clozapine 500 mg QHS, trazodone 100 mg QHS. Continue propranolol 20 mg QHS for anxiety. Continue Lithium 300 mg qam, 450 mg nightly (lithium level elevated at 1.3 on admission on 450 mg BID dosing).    2) Diabetes Melltius Type 2 - Continue Metformin 1000 mg BID, lantus 10 mg BID, cbq with meals and bedtime with sliding scale coverage   3) HTN- established problem, stable - Hold lisinipril 2.5 mg daily for now, currently hypotensive 90/45  4) Long-term anticoagulation- continue apixiban 5 mg BID  5) Hyperlipidemia- continue Simvastin 40 mg daily at 6PM.  11/14/20: Psychiatric exam above reviewed and remains accurate. Assessment and plan above reviewed and updated.   Jesse SansMegan M Aydia Maj, MD 11/14/2020, 1:33 PM

## 2020-11-14 NOTE — Plan of Care (Signed)
Patient during assessments this morning endorsed a mostly normal mood and affect was congruent to mood (I.e., relaxed and mildly constricted). Pt. Denied si/avh and endorsed ability to continue to remain safe on the unit. Pt. Orientation grossly intact. Pt. Denied physical pain and endorsed toleration of medications thus far. Pt. Endorsed no problems with sleeping and or eating at this time. Pt. Had no complaints for this writer; only reported continued homicidal ideations towards group home staff and peers. Pt. Thought process appreciably linear and logical, but perseverative on irritation with group home.   Patient has been complaint with medications and unit procedures thus far. Pt. Has been observed eating good thus far; observed getting up for breakfast to eat with peers. Pt. Has been able to remain safe on the unit thus far. Pt. Blood sugars monitored per MD orders. Pt. Thus far besides breakfast has been observed isolative and withdrawn to room.   Q x 15 minute observation checks in place/maintained for safety. Patient is provided with education throughout shift when appropriate and able.  Patient is given/offered medications per orders. Patient is encouraged to attend groups, participate in unit activities and continue with plan of care. Pt. Chart and plans of care reviewed. Pt. Given support and encouragement when appropriate and able.     Problem: Education: Goal: Emotional status will improve Outcome: Not Progressing Note: Pt. Continues to endorse homicidal ideations towards his group home; reporting that the staff and peers at his group home irritate him.  Goal: Mental status will improve Outcome: Progressing Note: Pt. Is alert and oriented. Pt. Thought process is appreciably linear and goal directed and logical.  Problem: Safety: Goal: Periods of time without injury will increase Outcome: Progressing Note: Pt. Endorsed ability to continue to remain safe on the unit and denied suicidal  ideations or thoughts of harming self.

## 2020-11-14 NOTE — Progress Notes (Signed)
Patient continues to pace the halls. Keeps to himself with minimal engagement with other. He will however engage in conversation when approached. Endorses both depression and anxiety both related to his situation at the group home which he can get preoccupied with.  Continues to complain about the group home staff and residents and desires to be able to live on his own. Continues to endorse HI toward staff and residents of the group home. Denies SI/ AVH and pain.  He is med compliant and received meds without incident.    Lance Blalock, LPN

## 2020-11-15 LAB — GLUCOSE, CAPILLARY
Glucose-Capillary: 110 mg/dL — ABNORMAL HIGH (ref 70–99)
Glucose-Capillary: 101 mg/dL — ABNORMAL HIGH (ref 70–99)
Glucose-Capillary: 127 mg/dL — ABNORMAL HIGH (ref 70–99)
Glucose-Capillary: 102 mg/dL — ABNORMAL HIGH (ref 70–99)

## 2020-11-15 NOTE — Progress Notes (Signed)
Recreation Therapy Notes  Date: 11/15/2020  Time: 10:00 am   Location: Court yard   Behavioral response: N/A   Intervention Topic: Leisure    Discussion/Intervention: Patient did not attend group.   Clinical Observations/Feedback:  Patient did not attend group.   Clytee Heinrich LRT/CTRS        Dorianne Perret 11/15/2020 11:43 AM 

## 2020-11-15 NOTE — B[facility] Counselor (Signed)
CSW contacted the pt's guardian, Lance [name], [phone], to follow up fax of consents earlier in the week. Guardian confirmed that she has received them, however, she has not sent them back. She reports that she will fax the consents back on 11/15/2020.  She asked if CSW was looking for alternative placement for the patient.  CSW informed that she has not been asked at this time to assist with placement, CSW pointed out that placement should be coordinated with guardian and ACTT team with some assistance if needed by CSW staff.  Guardian reports that "I can tell you now that I have no contacts and all the group homes he has ever found has been by social workers and his ACT team".   CSW informed that she can provide the guardian with the link to the state list of group homes by county if an email address could be provided.  Guardian reported that she had an incoming call she needed to take and did not provide the email address, though reports that she will call back.   CSW will follow up.  Penni Homans, MSW, LCSW 11/15/2020 12:42 PM

## 2020-11-15 NOTE — Plan of Care (Addendum)
Pt was educated on care plan and verbalizes understanding. Pt denies AVH. When asked about HI he said "I don't want to talk about it". Pt was not wanting to be questioned with his self inventory questions.  Torrie Mayers RN Problem: Education: Goal: Knowledge of [place] [facility] Education information/materials will improve Outcome: Progressing Goal: Emotional status will improve Outcome: Not Progressing Goal: Mental status will improve Outcome: Not Progressing Goal: Verbalization of understanding the information provided will improve Outcome: Progressing   Problem: Safety: Goal: Periods of time without injury will increase Outcome: Progressing   Problem: Education: Goal: Utilization of techniques to improve thought processes will improve Outcome: Progressing Goal: Knowledge of the prescribed therapeutic regimen will improve Outcome: Progressing   Problem: Safety: Goal: Ability to disclose and discuss suicidal ideas will improve Outcome: Progressing Goal: Ability to identify and utilize support systems that promote safety will improve Outcome: Progressing

## 2020-11-15 NOTE — Progress Notes (Signed)
[facility] MD Progress Note  11/15/2020 11:52 AM Lance [name]  MRN:  [id number]   CC " I am here."  Subjective: 45 year old male with schizoaffective disorder bipolar type presenting with suicidal ideations to cut his wrists, and homicidal ideations toward his group home members with plan to stab them.  No acute events overnight, medication compliant, attending to ADLs.  Patient resting in bed this morning, but awakens to voice.  He states he continues to feel depressed because he still here.  He continues to have homicidal ideations towards his group home members with plan to stab them.  He denies any suicidal ideations, auditory hallucinations, visual hallucinations.  No medication side effects.  Guardian recommending alternate group home placement as she is afraid that Lance [name] will assault someone if he returns home.  She has reached out to his [name] ACT team and also requests that her social workers assist.  Principal Problem: Schizoaffective disorder, bipolar type (HCC) Diagnosis: Principal Problem:   Schizoaffective disorder, bipolar type (HCC) Active Problems:   Diabetes mellitus without complication (HCC)   Hyperlipidemia   Chronic anticoagulation   Tobacco use disorder  Total Time spent with patient: 20 minutes  Past Psychiatric History: See H&P  Past Medical History:  Past Medical History:  Diagnosis Date  . Depression   . Diabetes mellitus without complication (HCC)   . Hyperlipidemia   . Hypertension   . Lupus anticoagulant disorder (HCC) 06/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  . PE (pulmonary thromboembolism) (HCC)   . Schizo affective schizophrenia (HCC)   . Supratherapeutic INR 11/19/2016   History reviewed. No pertinent surgical history. Family History:  Family History  Problem Relation Age of Onset  . CAD Mother   . CAD Sister    Family Psychiatric  History: See H&P Social History:  Social History   Substance and Sexual Activity  Alcohol Use No    Comment: occassionally     Social History   Substance and Sexual Activity  Drug Use Not Currently  . Types: Marijuana   Comment: Last use 11/29/16    Social History   Socioeconomic History  . Marital status: Single    Spouse name: Not on file  . Number of children: Not on file  . Years of education: Not on file  . Highest education level: Not on file  Occupational History  . Not on file  Tobacco Use  . Smoking status: Current Every Day Smoker    Packs/day: 1.00    Years: 23.00    Pack years: 23.00    Types: Cigarettes  . Smokeless tobacco: Never Used  . Tobacco comment: will provide material  Substance and Sexual Activity  . Alcohol use: No    Comment: occassionally  . Drug use: Not Currently    Types: Marijuana    Comment: Last use 11/29/16  . Sexual activity: Never    Birth control/protection: None    Comment: occasional marijuana- none recently  Other Topics Concern  . Not on file  Social History Narrative   From a group home in [place]   Social Determinants of Health   Financial Resource Strain: Not on file  Food Insecurity: Not on file  Transportation Needs: Not on file  Physical Activity: Not on file  Stress: Not on file  Social Connections: Not on file   Additional Social History:    Sleep: Fair  Appetite:  Fair  Current Medications: Current Facility-Administered Medications  Medication Dose Route Frequency Provider  Last Rate Last Admin  . acetaminophen (TYLENOL) tablet 650 mg  650 mg Oral Q6H PRN Dixon, Rashaun M, NP      . albuterol (VENTOLIN HFA) 108 (90 Base) MCG/ACT inhaler 1-2 puff  1-2 puff Inhalation Q6H PRN Dixon, Rashaun M, NP      . alum & mag hydroxide-simeth (MAALOX/MYLANTA) 200-200-20 MG/5ML suspension 30 mL  30 mL Oral Q4H PRN Dixon, Rashaun M, NP      . apixaban (ELIQUIS) tablet 5 mg  5 mg Oral BID Lerry Linerixon, Rashaun M, NP   5 mg at 11/15/20 0738  . cloZAPine (CLOZARIL) tablet 500 mg  500 mg Oral QHS Dixon, Rashaun M, NP   500 mg  at 11/14/20 2139  . hydrOXYzine (ATARAX/VISTARIL) tablet 50 mg  50 mg Oral QHS Jesse SansFreeman, Ronnae Kaser M, MD   50 mg at 11/14/20 2140  . insulin aspart (novoLOG) injection 0-15 Units  0-15 Units Subcutaneous TID WC Jesse SansFreeman, Yliana Gravois M, MD   2 Units at 11/12/20 1125  . insulin aspart (novoLOG) injection 0-5 Units  0-5 Units Subcutaneous QHS Jesse SansFreeman, Wilho Sharpley M, MD      . insulin glargine (LANTUS) injection 10 Units  10 Units Subcutaneous BID Jearld Leschixon, Rashaun M, NP   10 Units at 11/15/20 0738  . lithium carbonate (ESKALITH) CR tablet 450 mg  450 mg Oral QHS Jesse SansFreeman, Estrellita Lasky M, MD   450 mg at 11/14/20 2140  . lithium carbonate (LITHOBID) CR tablet 300 mg  300 mg Oral q AM Jesse SansFreeman, Alasha Mcguinness M, MD   300 mg at 11/15/20 0741  . magnesium hydroxide (MILK OF MAGNESIA) suspension 30 mL  30 mL Oral Daily PRN Durwin Noraixon, Rashaun M, NP      . metFORMIN (GLUCOPHAGE) tablet 1,000 mg  1,000 mg Oral BID WC Dixon, Rashaun M, NP   1,000 mg at 11/15/20 0738  . OLANZapine (ZYPREXA) tablet 20 mg  20 mg Oral QHS Dixon, Rashaun M, NP   20 mg at 11/14/20 2140  . propranolol (INDERAL) tablet 20 mg  20 mg Oral QHS Jesse SansFreeman, Aydrien Froman M, MD   20 mg at 11/14/20 2140  . sertraline (ZOLOFT) tablet 50 mg  50 mg Oral Daily Dixon, Rashaun M, NP   50 mg at 11/15/20 0737  . simvastatin (ZOCOR) tablet 40 mg  40 mg Oral q1800 Jearld Leschixon, Rashaun M, NP   40 mg at 11/14/20 1717  . traZODone (DESYREL) tablet 100 mg  100 mg Oral QHS Jesse SansFreeman, Lottie Sigman M, MD   100 mg at 11/14/20 2140    Lab Results:  Results for orders placed or performed during the hospital encounter of 11/11/20 (from the past 48 hour(s))  Glucose, capillary     Status: None   Collection Time: 11/13/20  3:58 PM  Result Value Ref Range   Glucose-Capillary 99 70 - 99 mg/dL    Comment: Glucose reference range applies only to samples taken after fasting for at least 8 hours.  Glucose, capillary     Status: Abnormal   Collection Time: 11/13/20  9:08 PM  Result Value Ref Range   Glucose-Capillary 132 (H) 70 -  99 mg/dL    Comment: Glucose reference range applies only to samples taken after fasting for at least 8 hours.  Glucose, capillary     Status: Abnormal   Collection Time: 11/14/20  7:27 AM  Result Value Ref Range   Glucose-Capillary 120 (H) 70 - 99 mg/dL    Comment: Glucose reference range applies only to samples taken after fasting for  at least 8 hours.  Glucose, capillary     Status: None   Collection Time: 11/14/20 11:16 AM  Result Value Ref Range   Glucose-Capillary 99 70 - 99 mg/dL    Comment: Glucose reference range applies only to samples taken after fasting for at least 8 hours.  Glucose, capillary     Status: None   Collection Time: 11/14/20  4:02 PM  Result Value Ref Range   Glucose-Capillary 95 70 - 99 mg/dL    Comment: Glucose reference range applies only to samples taken after fasting for at least 8 hours.  Glucose, capillary     Status: None   Collection Time: 11/14/20  8:04 PM  Result Value Ref Range   Glucose-Capillary 84 70 - 99 mg/dL    Comment: Glucose reference range applies only to samples taken after fasting for at least 8 hours.  Glucose, capillary     Status: Abnormal   Collection Time: 11/15/20  6:31 AM  Result Value Ref Range   Glucose-Capillary 110 (H) 70 - 99 mg/dL    Comment: Glucose reference range applies only to samples taken after fasting for at least 8 hours.  Glucose, capillary     Status: Abnormal   Collection Time: 11/15/20 11:17 AM  Result Value Ref Range   Glucose-Capillary 101 (H) 70 - 99 mg/dL    Comment: Glucose reference range applies only to samples taken after fasting for at least 8 hours.   Comment 1 Notify RN     Blood Alcohol level:  Lab Results  Component Value Date   ETH <10 11/09/2020   ETH <10 09/19/2020    Metabolic Disorder Labs: Lab Results  Component Value Date   HGBA1C 5.4 11/12/2020   MPG 108 11/12/2020   MPG 99.67 09/20/2020   Lab Results  Component Value Date   PROLACTIN 2.0 (L) 08/31/2016   PROLACTIN 4.8  05/22/2016   Lab Results  Component Value Date   CHOL 157 09/20/2020   TRIG 447 (H) 09/20/2020   HDL 32 (L) 09/20/2020   CHOLHDL 4.9 09/20/2020   VLDL UNABLE TO CALCULATE IF TRIGLYCERIDE OVER 400 mg/dL 16/96/7893   LDLCALC UNABLE TO CALCULATE IF TRIGLYCERIDE OVER 400 mg/dL 81/06/7508   LDLCALC 53 07/16/2020    Physical Findings: AIMS: Facial and Oral Movements Muscles of Facial Expression: None, normal Lips and Perioral Area: None, normal Jaw: None, normal Tongue: None, normal,Extremity Movements Upper (arms, wrists, hands, fingers): None, normal Lower (legs, knees, ankles, toes): None, normal, Trunk Movements Neck, shoulders, hips: None, normal, Overall Severity Severity of abnormal movements (highest score from questions above): None, normal Incapacitation due to abnormal movements: None, normal Patient's awareness of abnormal movements (rate only patient's report): No Awareness, Dental Status Current problems with teeth and/or dentures?: No Does patient usually wear dentures?: No  CIWA:    COWS:     Musculoskeletal: Strength & Muscle Tone: within normal limits Gait & Station: normal Patient leans: N/A  Psychiatric Specialty Exam:  Presentation  General Appearance: Disheveled  Eye Contact:Fair  Speech:Clear and Coherent  Speech Volume:Normal  Handedness:Right   Mood and Affect  Mood:Depressed; Dysphoric  Affect:Congruent; Flat   Thought Process  Thought Processes:Goal Directed  Descriptions of Associations:Intact  Orientation:Full (Time, Place and Person)  Thought Content:Perseveration  History of Schizophrenia/Schizoaffective disorder:Yes  Duration of Psychotic Symptoms:Greater than six months  Hallucinations:Denies Ideas of Reference:Percusatory  Suicidal Thoughts:Denies Homicidal Thoughts:Yes with plan  Sensorium  Memory:Immediate Fair; Recent Fair; Remote Fair  Judgment:Impaired  Insight:Lacking  Executive Functions   Concentration:Fair  Attention Span:Fair  Recall:Fair  [employer] of Knowledge:Fair  Language:Fair   Psychomotor Activity  Psychomotor Activity:No data recorded   Assets  Assets:Communication Skills; Desire for Improvement; Financial Resources/Insurance; Housing; Resilience; Social Support   Sleep  Sleep:Fair, 7.5 hours   Physical Exam: Physical Exam  ROS  Blood pressure 107/65, pulse 68, temperature 98.1 F (36.7 C), temperature source Oral, resp. rate 18, height 5\' 10"  (1.778 m), weight 82.6 kg, SpO2 99 %. Body mass index is 26.13 kg/m.   Treatment Plan Summary: Daily contact with patient to assess and evaluate symptoms and progress in treatment and Medication management 1) Schizoaffective Disorder, depressive type- established problem, unstable, current suicidal thoughts - Continue Zoloft 100 mg daily, Zyprexa 20 mg QHS, Clozapine 500 mg QHS, trazodone 100 mg QHS. Continue propranolol 20 mg QHS for anxiety. Continue Lithium 300 mg qam, 450 mg nightly (lithium level elevated at 1.3 on admission on 450 mg BID dosing).    2) Diabetes Melltius Type 2 - Continue Metformin 1000 mg BID, lantus 10 mg BID, cbq with meals and bedtime with sliding scale coverage   3) HTN- resolved - No longer in need of Lisinopril   4) Long-term anticoagulation- continue apixiban 5 mg BID  5) Hyperlipidemia- continue Simvastin 40 mg daily at 6PM.  11/15/20: Psychiatric exam above reviewed and remains accurate. Assessment and plan above reviewed and updated.    01/15/21, MD 11/15/2020, 11:52 AM

## 2020-11-15 NOTE — Progress Notes (Signed)
Pt now rates depression 10/10 and anxiety 7/10. Pt denies AVH, SI and HI. Pt has been med compliant and cooperative but impatient and irritable. Torrie Mayers RN

## 2020-11-15 NOTE — Progress Notes (Signed)
Patient has been cooperative with treatment and medication regime.Marland Kitchen He denies SI, HI & AVH. No behavioral issues to report on shift on shift.

## 2020-11-15 NOTE — [facility] Group Notes (Signed)
LCSW Group Therapy Note     11/15/2020 2:28 PM     Type of Therapy and Topic:  Group Therapy:  Feelings around Relapse and Recovery     Participation Level:  Did Not Attend     Description of Group:    Patients in this group will discuss emotions they experience before and after a relapse. They will process how experiencing these feelings, or avoidance of experiencing them, relates to having a relapse. Facilitator will guide patients to explore emotions they have related to recovery. Patients will be encouraged to process which emotions are more powerful. They will be guided to discuss the emotional reaction significant others in their lives may have to their relapse or recovery. Patients will be assisted in exploring ways to respond to the emotions of others without this contributing to a relapse.     Therapeutic Goals:  1.    Patient will identify two or more emotions that lead to a relapse for them  2.    Patient will identify two emotions that result when they relapse  3.    Patient will identify two emotions related to recovery  4.    Patient will demonstrate ability to communicate their needs through discussion and/or role plays        Summary of Patient Progress: X       Therapeutic Modalities:   Cognitive Behavioral Therapy  Solution-Focused Therapy  Assertiveness Training  Relapse Prevention Therapy        Montee Tallman [place], MSW, LCSW-A  11/15/2020 2:28 PM

## 2020-11-16 LAB — GLUCOSE, CAPILLARY
Glucose-Capillary: 139 mg/dL — ABNORMAL HIGH (ref 70–99)
Glucose-Capillary: 87 mg/dL (ref 70–99)
Glucose-Capillary: 134 mg/dL — ABNORMAL HIGH (ref 70–99)
Glucose-Capillary: 105 mg/dL — ABNORMAL HIGH (ref 70–99)

## 2020-11-16 LAB — CBC WITH DIFFERENTIAL/PLATELET
Abs Immature Granulocytes: 0.03 10*3/uL (ref 0.00–0.07)
Basophils Absolute: 0 10*3/uL (ref 0.0–0.1)
Basophils Relative: 0 %
Eosinophils Absolute: 0 10*3/uL (ref 0.0–0.5)
Eosinophils Relative: 0 %
HCT: 38.4 % — ABNORMAL LOW (ref 39.0–52.0)
Hemoglobin: 13.7 g/dL (ref 13.0–17.0)
Immature Granulocytes: 1 %
Lymphocytes Relative: 29 %
Lymphs Abs: 1.4 10*3/uL (ref 0.7–4.0)
MCH: 32.2 pg (ref 26.0–34.0)
MCHC: 35.7 g/dL (ref 30.0–36.0)
MCV: 90.1 fL (ref 80.0–100.0)
Monocytes Absolute: 0.6 10*3/uL (ref 0.1–1.0)
Monocytes Relative: 11 %
Neutro Abs: 2.9 10*3/uL (ref 1.7–7.7)
Neutrophils Relative %: 59 %
Platelets: 216 10*3/uL (ref 150–400)
RBC: 4.26 MIL/uL (ref 4.22–5.81)
RDW: 12.5 % (ref 11.5–15.5)
WBC: 4.9 10*3/uL (ref 4.0–10.5)
nRBC: 0 % (ref 0.0–0.2)

## 2020-11-16 NOTE — [facility] Group Notes (Signed)
LCSW Group Therapy Note  11/16/2020 2:48 PM  Type of Therapy and Topic:  Group Therapy:  Feelings around Relapse and Recovery  Participation Level:  Minimal   Description of Group:    Patients in this group will discuss emotions they experience before and after a relapse. They will process how experiencing these feelings, or avoidance of experiencing them, relates to having a relapse. Facilitator will guide patients to explore emotions they have related to recovery. Patients will be encouraged to process which emotions are more powerful. They will be guided to discuss the emotional reaction significant others in their lives may have to their relapse or recovery. Patients will be assisted in exploring ways to respond to the emotions of others without this contributing to a relapse.  Therapeutic Goals: 1. Patient will identify two or more emotions that lead to a relapse for them 2. Patient will identify two emotions that result when they relapse 3. Patient will identify two emotions related to recovery 4. Patient will demonstrate ability to communicate their needs through discussion and/or role plays   Summary of Patient Progress: Patient was present for the entirety of the group session. Patient was mostly quite throughout group, patient shared only, when asked to. Patient was respectful to others.    Therapeutic Modalities:   Cognitive Behavioral Therapy Solution-Focused Therapy Assertiveness Training Relapse Prevention Therapy   Gwenevere Ghazi, MSW, [place], [place] 11/16/2020 2:48 PM

## 2020-11-16 NOTE — Plan of Care (Signed)
  Problem: Education: Goal: Utilization of techniques to improve thought processes will improve Outcome: Progressing Goal: Knowledge of the prescribed therapeutic regimen will improve Outcome: Progressing   Problem: Education: Goal: Knowledge of [facility] Education information/materials will improve Outcome: Progressing Goal: Emotional status will improve Outcome: Progressing Goal: Mental status will improve Outcome: Progressing Goal: Verbalization of understanding the information provided will improve Outcome: Progressing   

## 2020-11-16 NOTE — Progress Notes (Signed)
CLOZAPINE MONITORING (reflects NEW [facility]S GUIDELINES EFFECTIVE 03/26/2014):  Check ANC at least weekly while inpatient.  For general population patients, i.e., those without benign ethnic neutropenia (BEN): --If ANC 1000-1499, increase ANC monitoring to 3x/wk --If ANC < 1000, HOLD CLOZAPINE and get psych consult   For patients with BEN: --If ANC 500-999, increase ANC monitoring to 3x/wk --If ANC < 500, HOLD CLOZAPINE and get psych consult  REMS-certified psychiatry provider can continue drug with ANC below cited thresholds if they document medical opinion that the neutropenia is not clozapine-induced (heme consult is recommended) or that risk of interrupting therapy is greater than the risk of developing severe neutropenia.  --SEE PRESCRIBING INFORMATION FOR ADDITIONAL DETAILS  5/28:  ANC = 5300  6/04:  ANC = 2900 Pt is registered with [facility] program.    RDA: [id number]     - Will monitor ANC weekly per [place] Polily - Next CBC with diff on 6/1 @ 1200.

## 2020-11-16 NOTE — Progress Notes (Signed)
Pleasant and cooperative. Active in the day room interacting well with other patient watching TV and coloring. No pacing in the halls. Med compliant with no behavioral issues. Denies having SI/HI/AVH. Endorses anxiety and depression but states that he is feeling better.    Cleo Butler-Nicholson, LPN

## 2020-11-16 NOTE — Progress Notes (Signed)
Patient reports feeling tired and depressed this morning. He does not answer when asked if he is having SI or HI. He states he feels "bad emotionally". Patient needed to be told to come to the medication three times before complying. After getting up, patient is observed to be pacing the halls. Patient remains medication compliant. Support and encouragement provided. Patient remains safe on the unit at this time and q15 min safety checks have been maintained.

## 2020-11-16 NOTE — Progress Notes (Signed)
[facility] MD Progress Note  11/16/2020 12:57 PM Lance [name]  MRN:  [id number] Subjective: Patient seen chart reviewed.  Patient continues to have a chief complaint of disliking his group home.  Walks around the unit looking bored most of the day.  Denies acute suicidal intent or plan.  Denies acute hallucinations.  No new physical complaints. Principal Problem: Schizoaffective disorder, bipolar type (HCC) Diagnosis: Principal Problem:   Schizoaffective disorder, bipolar type (HCC) Active Problems:   Diabetes mellitus without complication (HCC)   Hyperlipidemia   Chronic anticoagulation   Tobacco use disorder  Total Time spent with patient: 30 minutes  Past Psychiatric History: Past history of longstanding schizoaffective disorder  Past Medical History:  Past Medical History:  Diagnosis Date  . Depression   . Diabetes mellitus without complication (HCC)   . Hyperlipidemia   . Hypertension   . Lupus anticoagulant disorder (HCC) 06/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  . PE (pulmonary thromboembolism) (HCC)   . Schizo affective schizophrenia (HCC)   . Supratherapeutic INR 11/19/2016   History reviewed. No pertinent surgical history. Family History:  Family History  Problem Relation Age of Onset  . CAD Mother   . CAD Sister    Family Psychiatric  History: See previous Social History:  Social History   Substance and Sexual Activity  Alcohol Use No   Comment: occassionally     Social History   Substance and Sexual Activity  Drug Use Not Currently  . Types: Marijuana   Comment: Last use 11/29/16    Social History   Socioeconomic History  . Marital status: Single    Spouse name: Not on file  . Number of children: Not on file  . Years of education: Not on file  . Highest education level: Not on file  Occupational History  . Not on file  Tobacco Use  . Smoking status: Current Every Day Smoker    Packs/day: 1.00    Years: 23.00    Pack years: 23.00     Types: Cigarettes  . Smokeless tobacco: Never Used  . Tobacco comment: will provide material  Substance and Sexual Activity  . Alcohol use: No    Comment: occassionally  . Drug use: Not Currently    Types: Marijuana    Comment: Last use 11/29/16  . Sexual activity: Never    Birth control/protection: None    Comment: occasional marijuana- none recently  Other Topics Concern  . Not on file  Social History Narrative   From a group home in [place]   Social Determinants of Health   Financial Resource Strain: Not on file  Food Insecurity: Not on file  Transportation Needs: Not on file  Physical Activity: Not on file  Stress: Not on file  Social Connections: Not on file   Additional Social History:                         Sleep: Fair  Appetite:  Fair  Current Medications: Current Facility-Administered Medications  Medication Dose Route Frequency Provider Last Rate Last Admin  . acetaminophen (TYLENOL) tablet 650 mg  650 mg Oral Q6H PRN Dixon, Rashaun M, NP      . albuterol (VENTOLIN HFA) 108 (90 Base) MCG/ACT inhaler 1-2 puff  1-2 puff Inhalation Q6H PRN Dixon, Rashaun M, NP      . alum & mag hydroxide-simeth (MAALOX/MYLANTA) 200-200-20 MG/5ML suspension 30 mL  30 mL Oral Q4H PRN Dixon, Rashaun M,  NP      . apixaban (ELIQUIS) tablet 5 mg  5 mg Oral BID Dixon, Rashaun M, NP   5 mg at 11/16/20 0830  . cloZAPine (CLOZARIL) tablet 500 mg  500 mg Oral QHS Dixon, Rashaun M, NP   500 mg at 11/15/20 2121  . hydrOXYzine (ATARAX/VISTARIL) tablet 50 mg  50 mg Oral QHS [name], MD   50 mg at 11/15/20 2122  . insulin aspart (novoLOG) injection 0-15 Units  0-15 Units Subcutaneous TID WC [name], MD   2 Units at 11/16/20 0830  . insulin aspart (novoLOG) injection 0-5 Units  0-5 Units Subcutaneous QHS Les Pou M, MD      . insulin glargine (LANTUS) injection 10 Units  10 Units Subcutaneous BID Jearld Lesch, NP   10 Units at 11/16/20 0830  . lithium  carbonate (ESKALITH) CR tablet 450 mg  450 mg Oral QHS [name], MD   450 mg at 11/15/20 2120  . lithium carbonate (LITHOBID) CR tablet 300 mg  300 mg Oral q AM [name], MD   300 mg at 11/16/20 0830  . magnesium hydroxide (MILK OF MAGNESIA) suspension 30 mL  30 mL Oral Daily PRN Dixon, Rashaun M, NP      . metFORMIN (GLUCOPHAGE) tablet 1,000 mg  1,000 mg Oral BID WC Dixon, Rashaun M, NP   1,000 mg at 11/16/20 0830  . OLANZapine (ZYPREXA) tablet 20 mg  20 mg Oral QHS Dixon, Rashaun M, NP   20 mg at 11/15/20 2119  . propranolol (INDERAL) tablet 20 mg  20 mg Oral QHS [name], MD   20 mg at 11/15/20 2119  . sertraline (ZOLOFT) tablet 50 mg  50 mg Oral Daily Dixon, Rashaun M, NP   50 mg at 11/16/20 0830  . simvastatin (ZOCOR) tablet 40 mg  40 mg Oral q1800 Jearld Lesch, NP   40 mg at 11/15/20 1700  . traZODone (DESYREL) tablet 100 mg  100 mg Oral QHS [name], MD   100 mg at 11/15/20 2118    Lab Results:  Results for orders placed or performed during the hospital encounter of 11/11/20 (from the past 48 hour(s))  Glucose, capillary     Status: None   Collection Time: 11/14/20  4:02 PM  Result Value Ref Range   Glucose-Capillary 95 70 - 99 mg/dL    Comment: Glucose reference range applies only to samples taken after fasting for at least 8 hours.  Glucose, capillary     Status: None   Collection Time: 11/14/20  8:04 PM  Result Value Ref Range   Glucose-Capillary 84 70 - 99 mg/dL    Comment: Glucose reference range applies only to samples taken after fasting for at least 8 hours.  Glucose, capillary     Status: Abnormal   Collection Time: 11/15/20  6:31 AM  Result Value Ref Range   Glucose-Capillary 110 (H) 70 - 99 mg/dL    Comment: Glucose reference range applies only to samples taken after fasting for at least 8 hours.  Glucose, capillary     Status: Abnormal   Collection Time: 11/15/20 11:17 AM  Result Value Ref Range   Glucose-Capillary 101 (H) 70 - 99  mg/dL    Comment: Glucose reference range applies only to samples taken after fasting for at least 8 hours.   Comment 1 Notify RN   Glucose, capillary     Status: Abnormal   Collection Time:  11/15/20  4:15 PM  Result Value Ref Range   Glucose-Capillary 127 (H) 70 - 99 mg/dL    Comment: Glucose reference range applies only to samples taken after fasting for at least 8 hours.  Glucose, capillary     Status: Abnormal   Collection Time: 11/15/20  8:43 PM  Result Value Ref Range   Glucose-Capillary 102 (H) 70 - 99 mg/dL    Comment: Glucose reference range applies only to samples taken after fasting for at least 8 hours.   Comment 1 Notify RN   Glucose, capillary     Status: Abnormal   Collection Time: 11/16/20  6:45 AM  Result Value Ref Range   Glucose-Capillary 139 (H) 70 - 99 mg/dL    Comment: Glucose reference range applies only to samples taken after fasting for at least 8 hours.  CBC with Differential/Platelet     Status: Abnormal   Collection Time: 11/16/20 10:18 AM  Result Value Ref Range   WBC 4.9 4.0 - 10.5 K/uL   RBC 4.26 4.22 - 5.81 MIL/uL   Hemoglobin 13.7 13.0 - 17.0 g/dL   HCT 69.6 (L) 29.5 - 28.4 %   MCV 90.1 80.0 - 100.0 fL   MCH 32.2 26.0 - 34.0 pg   MCHC 35.7 30.0 - 36.0 g/dL   RDW 13.2 44.0 - 10.2 %   Platelets 216 150 - 400 K/uL   nRBC 0.0 0.0 - 0.2 %   Neutrophils Relative % 59 %   Neutro Abs 2.9 1.7 - 7.7 K/uL   Lymphocytes Relative 29 %   Lymphs Abs 1.4 0.7 - 4.0 K/uL   Monocytes Relative 11 %   Monocytes Absolute 0.6 0.1 - 1.0 K/uL   Eosinophils Relative 0 %   Eosinophils Absolute 0.0 0.0 - 0.5 K/uL   Basophils Relative 0 %   Basophils Absolute 0.0 0.0 - 0.1 K/uL   Immature Granulocytes 1 %   Abs Immature Granulocytes 0.03 0.00 - 0.07 K/uL    Comment: Performed at [facility], [address] [name], [place] [address]  Glucose, capillary     Status: None   Collection Time: 11/16/20 11:15 AM  Result Value Ref Range   Glucose-Capillary 87  70 - 99 mg/dL    Comment: Glucose reference range applies only to samples taken after fasting for at least 8 hours.    Blood Alcohol level:  Lab Results  Component Value Date   ETH <10 11/09/2020   ETH <10 09/19/2020    Metabolic Disorder Labs: Lab Results  Component Value Date   HGBA1C 5.4 11/12/2020   MPG 108 11/12/2020   MPG 99.67 09/20/2020   Lab Results  Component Value Date   PROLACTIN 2.0 (L) 08/31/2016   PROLACTIN 4.8 05/22/2016   Lab Results  Component Value Date   CHOL 157 09/20/2020   TRIG 447 (H) 09/20/2020   HDL 32 (L) 09/20/2020   CHOLHDL 4.9 09/20/2020   VLDL UNABLE TO CALCULATE IF TRIGLYCERIDE OVER 400 mg/dL 64/40/3474   LDLCALC UNABLE TO CALCULATE IF TRIGLYCERIDE OVER 400 mg/dL 25/95/6387   LDLCALC 53 07/16/2020    Physical Findings: AIMS: Facial and Oral Movements Muscles of Facial Expression: None, normal Lips and Perioral Area: None, normal Jaw: None, normal Tongue: None, normal,Extremity Movements Upper (arms, wrists, hands, fingers): None, normal Lower (legs, knees, ankles, toes): None, normal, Trunk Movements Neck, shoulders, hips: None, normal, Overall Severity Severity of abnormal movements (highest score from questions above): None, normal Incapacitation due to abnormal movements:  None, normal Patient's awareness of abnormal movements (rate only patient's report): No Awareness, Dental Status Current problems with teeth and/or dentures?: No Does patient usually wear dentures?: No  CIWA:    COWS:     Musculoskeletal: Strength & Muscle Tone: within normal limits Gait & Station: normal Patient leans: N/A  Psychiatric Specialty Exam:  Presentation  General Appearance: Disheveled  Eye Contact:Fair  Speech:Clear and Coherent  Speech Volume:Normal  Handedness:Right   Mood and Affect  Mood:Depressed; Dysphoric  Affect:Congruent; Flat   Thought Process  Thought Processes:Goal Directed  Descriptions of  Associations:Intact  Orientation:Full (Time, Place and Person)  Thought Content:Perseveration  History of Schizophrenia/Schizoaffective disorder:Yes  Duration of Psychotic Symptoms:Greater than six months  Hallucinations:No data recorded Ideas of Reference:Percusatory  Suicidal Thoughts:No data recorded Homicidal Thoughts:No data recorded  Sensorium  Memory:Immediate Fair; Recent Fair; Remote Fair  Judgment:Impaired  Insight:Lacking   Executive Functions  Concentration:Fair  Attention Span:Fair  Recall:Fair  Fund of Knowledge:Fair  Language:Fair   Psychomotor Activity  Psychomotor Activity:No data recorded  Assets  Assets:Communication Skills; Desire for Improvement; Financial Resources/Insurance; Housing; Resilience; Social Support   Sleep  Sleep:No data recorded   Physical Exam: Physical Exam Vitals and nursing note reviewed.  Constitutional:      Appearance: Normal appearance.  HENT:     Head: Normocephalic and atraumatic.     Mouth/Throat:     Pharynx: Oropharynx is clear.  Eyes:     Pupils: Pupils are equal, round, and reactive to light.  Cardiovascular:     Rate and Rhythm: Normal rate and regular rhythm.  Pulmonary:     Effort: Pulmonary effort is normal.     Breath sounds: Normal breath sounds.  Abdominal:     General: Abdomen is flat.     Palpations: Abdomen is soft.  Musculoskeletal:        General: Normal range of motion.  Skin:    General: Skin is warm and dry.  Neurological:     General: No focal deficit present.     Mental Status: He is alert. Mental status is at baseline.  Psychiatric:        Attention and Perception: He is inattentive.        Mood and Affect: Mood normal. Affect is flat.        Speech: Speech is delayed.        Behavior: Behavior is slowed.        Thought Content: Thought content normal.        Cognition and Memory: Cognition is impaired.    Review of Systems  Constitutional: Negative.   HENT:  Negative.   Eyes: Negative.   Respiratory: Negative.   Cardiovascular: Negative.   Gastrointestinal: Negative.   Musculoskeletal: Negative.   Skin: Negative.   Neurological: Negative.   Psychiatric/Behavioral: Positive for depression. Negative for hallucinations, substance abuse and suicidal ideas. The patient is nervous/anxious.    Blood pressure 109/69, pulse 66, temperature 98 F (36.7 C), temperature source Oral, resp. rate 16, height  (1.778 m), weight 82.6 kg, SpO2 99 %. Body mass index is 26.13 kg/m.   Treatment Plan Summary: Plan Patient appears to be fairly stable he is back on his usual medication.  Denies acute suicidal intent.  Very focused on disliking his group home and wanting to have a new one found for him.  Says the treatment team is working on doing that.  Encourage patient to participate in activities on the ward.  No change to medicine.  Blood  sugars are looking stable so his blood pressure.  Mordecai Rasmussen, MD 11/16/2020, 12:57 PM

## 2020-11-17 LAB — GLUCOSE, CAPILLARY
Glucose-Capillary: 116 mg/dL — ABNORMAL HIGH (ref 70–99)
Glucose-Capillary: 105 mg/dL — ABNORMAL HIGH (ref 70–99)
Glucose-Capillary: 141 mg/dL — ABNORMAL HIGH (ref 70–99)
Glucose-Capillary: 128 mg/dL — ABNORMAL HIGH (ref 70–99)

## 2020-11-17 NOTE — Progress Notes (Signed)
[facility] LCSW Group Therapy Note  Date/Time:  11/17/2020 1:22 PM- 2:16 PM   Type of Therapy and Topic:  Group Therapy:  Healthy and Unhealthy Supports  Participation Level:  None   Description of Group:  Patients in this group were introduced to the idea of adding a variety of healthy supports to address the various needs in their lives.Patients discussed what additional healthy supports could be helpful in their recovery and wellness after discharge in order to prevent future hospitalizations.   An emphasis was placed on using counselor, doctor, therapy groups, 12-step groups, and problem-specific support groups to expand supports.  They also worked as a group on developing a specific plan for several patients to deal with unhealthy supports through boundary-setting, psychoeducation with loved ones, and even termination of relationships.   Therapeutic Goals:   1)  discuss importance of adding supports to stay well once out of the hospital  2)  compare healthy versus unhealthy supports and identify some examples of each  3)  generate ideas and descriptions of healthy supports that can be added  4)  offer mutual support about how to address unhealthy supports  5)  encourage active participation in and adherence to discharge plan    Summary of Patient Progress:  Patient came to group but did not participate  Therapeutic Modalities:   Motivational Interviewing Brief Solution-Focused Therapy  Susa Simmonds, LCSWA 11/17/2020  3:12 PM

## 2020-11-17 NOTE — Progress Notes (Signed)
Pt rates depression and anxiety both 5/10. Pt denies SI, HI and AVH. Pt has as a flat affect, and irritable and impatient at times Pt has been med compliant. Torrie Mayers RN

## 2020-11-17 NOTE — Progress Notes (Signed)
[facility] MD Progress Note  11/17/2020 1:03 PM Lance [name] Subjective: Patient seen and chart reviewed.  Patient continues to focus most of his thoughts on being dissatisfied with his group home.  Tried to gently get him to talk more about that but he stays stuck on generalities about just not liking it and not being respected.  Denies suicidal ideation.  Patient is up and out of his room although he stays aloof from most of the rest of the patients.  Blood sugars are well controlled. Principal Problem: Schizoaffective disorder, bipolar type (HCC) Diagnosis: Principal Problem:   Schizoaffective disorder, bipolar type (HCC) Active Problems:   Diabetes mellitus without complication (HCC)   Hyperlipidemia   Chronic anticoagulation   Tobacco use disorder  Total Time spent with patient: 30 minutes  Past Psychiatric History: Past history of chronic schizophrenia and several medical problems  Past Medical History:  Past Medical History:  Diagnosis Date  . Depression   . Diabetes mellitus without complication (HCC)   . Hyperlipidemia   . Hypertension   . Lupus anticoagulant disorder (HCC) 06/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  . PE (pulmonary thromboembolism) (HCC)   . Schizo affective schizophrenia (HCC)   . Supratherapeutic INR 11/19/2016   History reviewed. No pertinent surgical history. Family History:  Family History  Problem Relation Age of Onset  . CAD Mother   . CAD Sister    Family Psychiatric  History: See previous Social History:  Social History   Substance and Sexual Activity  Alcohol Use No   Comment: occassionally     Social History   Substance and Sexual Activity  Drug Use Not Currently  . Types: Marijuana   Comment: Last use 11/29/16    Social History   Socioeconomic History  . Marital status: Single    Spouse name: Not on file  . Number of children: Not on file  . Years of education: Not on file  . Highest education level:  Not on file  Occupational History  . Not on file  Tobacco Use  . Smoking status: Current Every Day Smoker    Packs/day: 1.00    Years: 23.00    Pack years: 23.00    Types: Cigarettes  . Smokeless tobacco: Never Used  . Tobacco comment: will provide material  Substance and Sexual Activity  . Alcohol use: No    Comment: occassionally  . Drug use: Not Currently    Types: Marijuana    Comment: Last use 11/29/16  . Sexual activity: Never    Birth control/protection: None    Comment: occasional marijuana- none recently  Other Topics Concern  . Not on file  Social History Narrative   From a group home in [place]   Social Determinants of Health   Financial Resource Strain: Not on file  Food Insecurity: Not on file  Transportation Needs: Not on file  Physical Activity: Not on file  Stress: Not on file  Social Connections: Not on file   Additional Social History:                         Sleep: Fair  Appetite:  Fair  Current Medications: Current Facility-Administered Medications  Medication Dose Route Frequency Provider Last Rate Last Admin  . acetaminophen (TYLENOL) tablet 650 mg  650 mg Oral Q6H PRN Dixon, Rashaun M, NP      . albuterol (VENTOLIN HFA) 108 (90 Base) MCG/ACT inhaler  1-2 puff  1-2 puff Inhalation Q6H PRN Dixon, Rashaun M, NP      . alum & mag hydroxide-simeth (MAALOX/MYLANTA) 200-200-20 MG/5ML suspension 30 mL  30 mL Oral Q4H PRN Dixon, Rashaun M, NP      . apixaban (ELIQUIS) tablet 5 mg  5 mg Oral BID Lerry Linerixon, Rashaun M, NP   5 mg at 11/17/20 0827  . cloZAPine (CLOZARIL) tablet 500 mg  500 mg Oral QHS Dixon, Rashaun M, NP   500 mg at 11/16/20 2103  . hydrOXYzine (ATARAX/VISTARIL) tablet 50 mg  50 mg Oral QHS Jesse SansFreeman, Megan M, MD   50 mg at 11/16/20 2103  . insulin aspart (novoLOG) injection 0-15 Units  0-15 Units Subcutaneous TID WC Jesse SansFreeman, Megan M, MD   2 Units at 11/16/20 1610  . insulin aspart (novoLOG) injection 0-5 Units  0-5 Units  Subcutaneous QHS Jesse SansFreeman, Megan M, MD      . insulin glargine (LANTUS) injection 10 Units  10 Units Subcutaneous BID Jearld Leschixon, Rashaun M, NP   10 Units at 11/17/20 0824  . lithium carbonate (ESKALITH) CR tablet 450 mg  450 mg Oral QHS Jesse SansFreeman, Megan M, MD   450 mg at 11/16/20 2104  . lithium carbonate (LITHOBID) CR tablet 300 mg  300 mg Oral q AM Jesse SansFreeman, Megan M, MD   300 mg at 11/17/20 0826  . magnesium hydroxide (MILK OF MAGNESIA) suspension 30 mL  30 mL Oral Daily PRN Durwin Noraixon, Rashaun M, NP      . metFORMIN (GLUCOPHAGE) tablet 1,000 mg  1,000 mg Oral BID WC Dixon, Rashaun M, NP   1,000 mg at 11/17/20 0827  . OLANZapine (ZYPREXA) tablet 20 mg  20 mg Oral QHS Dixon, Rashaun M, NP   20 mg at 11/16/20 2103  . propranolol (INDERAL) tablet 20 mg  20 mg Oral QHS Jesse SansFreeman, Megan M, MD   20 mg at 11/16/20 2103  . sertraline (ZOLOFT) tablet 50 mg  50 mg Oral Daily Dixon, Rashaun M, NP   50 mg at 11/17/20 0827  . simvastatin (ZOCOR) tablet 40 mg  40 mg Oral q1800 Jearld Leschixon, Rashaun M, NP   40 mg at 11/16/20 1743  . traZODone (DESYREL) tablet 100 mg  100 mg Oral QHS Jesse SansFreeman, Megan M, MD   100 mg at 11/16/20 2103    Lab Results:  Results for orders placed or performed during the hospital encounter of 11/11/20 (from the past 48 hour(s))  Glucose, capillary     Status: Abnormal   Collection Time: 11/15/20  4:15 PM  Result Value Ref Range   Glucose-Capillary 127 (H) 70 - 99 mg/dL    Comment: Glucose reference range applies only to samples taken after fasting for at least 8 hours.  Glucose, capillary     Status: Abnormal   Collection Time: 11/15/20  8:43 PM  Result Value Ref Range   Glucose-Capillary 102 (H) 70 - 99 mg/dL    Comment: Glucose reference range applies only to samples taken after fasting for at least 8 hours.   Comment 1 Notify RN   Glucose, capillary     Status: Abnormal   Collection Time: 11/16/20  6:45 AM  Result Value Ref Range   Glucose-Capillary 139 (H) 70 - 99 mg/dL    Comment: Glucose  reference range applies only to samples taken after fasting for at least 8 hours.  CBC with Differential/Platelet     Status: Abnormal   Collection Time: 11/16/20 10:18 AM  Result Value Ref Range  WBC 4.9 4.0 - 10.5 K/uL   RBC 4.26 4.22 - 5.81 MIL/uL   Hemoglobin 13.7 13.0 - 17.0 g/dL   HCT 09.2 (L) 33.0 - 07.6 %   MCV 90.1 80.0 - 100.0 fL   MCH 32.2 26.0 - 34.0 pg   MCHC 35.7 30.0 - 36.0 g/dL   RDW 22.6 33.3 - 54.5 %   Platelets 216 150 - 400 K/uL   nRBC 0.0 0.0 - 0.2 %   Neutrophils Relative % 59 %   Neutro Abs 2.9 1.7 - 7.7 K/uL   Lymphocytes Relative 29 %   Lymphs Abs 1.4 0.7 - 4.0 K/uL   Monocytes Relative 11 %   Monocytes Absolute 0.6 0.1 - 1.0 K/uL   Eosinophils Relative 0 %   Eosinophils Absolute 0.0 0.0 - 0.5 K/uL   Basophils Relative 0 %   Basophils Absolute 0.0 0.0 - 0.1 K/uL   Immature Granulocytes 1 %   Abs Immature Granulocytes 0.03 0.00 - 0.07 K/uL    Comment: Performed at [facility], [address] [place], [place] [address]  Glucose, capillary     Status: None   Collection Time: 11/16/20 11:15 AM  Result Value Ref Range   Glucose-Capillary 87 70 - 99 mg/dL    Comment: Glucose reference range applies only to samples taken after fasting for at least 8 hours.  Glucose, capillary     Status: Abnormal   Collection Time: 11/16/20  4:09 PM  Result Value Ref Range   Glucose-Capillary 134 (H) 70 - 99 mg/dL    Comment: Glucose reference range applies only to samples taken after fasting for at least 8 hours.  Glucose, capillary     Status: Abnormal   Collection Time: 11/16/20  8:34 PM  Result Value Ref Range   Glucose-Capillary 105 (H) 70 - 99 mg/dL    Comment: Glucose reference range applies only to samples taken after fasting for at least 8 hours.  Glucose, capillary     Status: Abnormal   Collection Time: 11/17/20  6:56 AM  Result Value Ref Range   Glucose-Capillary 116 (H) 70 - 99 mg/dL    Comment: Glucose reference range applies only to samples  taken after fasting for at least 8 hours.  Glucose, capillary     Status: Abnormal   Collection Time: 11/17/20 11:21 AM  Result Value Ref Range   Glucose-Capillary 105 (H) 70 - 99 mg/dL    Comment: Glucose reference range applies only to samples taken after fasting for at least 8 hours.   Comment 1 Notify RN     Blood Alcohol level:  Lab Results  Component Value Date   ETH <10 11/09/2020   ETH <10 09/19/2020    Metabolic Disorder Labs: Lab Results  Component Value Date   HGBA1C 5.4 11/12/2020   MPG 108 11/12/2020   MPG 99.67 09/20/2020   Lab Results  Component Value Date   PROLACTIN 2.0 (L) 08/31/2016   PROLACTIN 4.8 05/22/2016   Lab Results  Component Value Date   CHOL 157 09/20/2020   TRIG 447 (H) 09/20/2020   HDL 32 (L) 09/20/2020   CHOLHDL 4.9 09/20/2020   VLDL UNABLE TO CALCULATE IF TRIGLYCERIDE OVER 400 mg/dL 89/37/3428   LDLCALC UNABLE TO CALCULATE IF TRIGLYCERIDE OVER 400 mg/dL 76/81/1572   LDLCALC 53 07/16/2020    Physical Findings: AIMS: Facial and Oral Movements Muscles of Facial Expression: None, normal Lips and Perioral Area: None, normal Jaw: None, normal Tongue: None, normal,Extremity Movements Upper (  arms, wrists, hands, fingers): None, normal Lower (legs, knees, ankles, toes): None, normal, Trunk Movements Neck, shoulders, hips: None, normal, Overall Severity Severity of abnormal movements (highest score from questions above): None, normal Incapacitation due to abnormal movements: None, normal Patient's awareness of abnormal movements (rate only patient's report): No Awareness, Dental Status Current problems with teeth and/or dentures?: No Does patient usually wear dentures?: No  CIWA:    COWS:     Musculoskeletal: Strength & Muscle Tone: within normal limits Gait & Station: normal Patient leans: N/A  Psychiatric Specialty Exam:  Presentation  General Appearance: Disheveled  Eye Contact:Fair  Speech:Clear and Coherent  Speech  Volume:Normal  Handedness:Right   Mood and Affect  Mood:Depressed; Dysphoric  Affect:Congruent; Flat   Thought Process  Thought Processes:Goal Directed  Descriptions of Associations:Intact  Orientation:Full (Time, Place and Person)  Thought Content:Perseveration  History of Schizophrenia/Schizoaffective disorder:Yes  Duration of Psychotic Symptoms:Greater than six months  Hallucinations:No data recorded Ideas of Reference:Percusatory  Suicidal Thoughts:No data recorded Homicidal Thoughts:No data recorded  Sensorium  Memory:Immediate Fair; Recent Fair; Remote Fair  Judgment:Impaired  Insight:Lacking   Executive Functions  Concentration:Fair  Attention Span:Fair  Recall:Fair  Fund of Knowledge:Fair  Language:Fair   Psychomotor Activity  Psychomotor Activity:No data recorded  Assets  Assets:Communication Skills; Desire for Improvement; Financial Resources/Insurance; Housing; Resilience; Social Support   Sleep  Sleep:No data recorded   Physical Exam: Physical Exam Vitals and nursing note reviewed.  Constitutional:      Appearance: Normal appearance.  HENT:     Head: Normocephalic and atraumatic.     Mouth/Throat:     Pharynx: Oropharynx is clear.  Eyes:     Pupils: Pupils are equal, round, and reactive to light.  Cardiovascular:     Rate and Rhythm: Normal rate and regular rhythm.  Pulmonary:     Effort: Pulmonary effort is normal.     Breath sounds: Normal breath sounds.  Abdominal:     General: Abdomen is flat.     Palpations: Abdomen is soft.  Musculoskeletal:        General: Normal range of motion.  Skin:    General: Skin is warm and dry.  Neurological:     General: No focal deficit present.     Mental Status: He is alert. Mental status is at baseline.  Psychiatric:        Attention and Perception: He is inattentive.        Mood and Affect: Mood normal. Affect is blunt.        Speech: Speech is delayed.        Behavior:  Behavior is slowed.        Thought Content: Thought content normal.        Cognition and Memory: Cognition is impaired.        Judgment: Judgment is impulsive.    Review of Systems  Constitutional: Negative.   HENT: Negative.   Eyes: Negative.   Respiratory: Negative.   Cardiovascular: Negative.   Gastrointestinal: Negative.   Musculoskeletal: Negative.   Skin: Negative.   Neurological: Negative.   Psychiatric/Behavioral: Negative for depression, hallucinations, substance abuse and suicidal ideas. The patient is nervous/anxious.    Blood pressure 127/79, pulse 87, temperature (!) 97 F (36.1 C), temperature source Oral, resp. rate 18, height  (1.778 m), weight 82.6 kg, SpO2 91 %. Body mass index is 26.13 kg/m.   Treatment Plan Summary: Plan No change to medication.  Spent some time encouraging him and supporting him.  Tried  to get him to articulate what he might be really looking for in a group home but of course he stays very concrete about it.  Glad he is up and taking care of his basic health.  Encouraged him to talk with treatment team and be open minded working with them  Mordecai Rasmussen, MD 11/17/2020, 1:03 PM

## 2020-11-17 NOTE — Progress Notes (Signed)
Patient is calm and cooperative with assessment. Affect is flat, but patient will engage in conversation. He appears restless and says that he has just been pacing the halls all day. Patient denies SI, HI, and AVH, but endorses depression. Patient remains medication compliant. Support and encouragement provided. Patient remains safe on the unit at this time.

## 2020-11-17 NOTE — Progress Notes (Signed)
Patient has been cooperative with treatment and medication regime.. He denies SI, HI & AVH. No behavioral issues to report on shift on shift.  

## 2020-11-18 LAB — GLUCOSE, CAPILLARY
Glucose-Capillary: 117 mg/dL — ABNORMAL HIGH (ref 70–99)
Glucose-Capillary: 121 mg/dL — ABNORMAL HIGH (ref 70–99)
Glucose-Capillary: 113 mg/dL — ABNORMAL HIGH (ref 70–99)
Glucose-Capillary: 114 mg/dL — ABNORMAL HIGH (ref 70–99)

## 2020-11-18 MED ORDER — SERTRALINE HCL 50 MG PO TABS: 50.0000 mg | ORAL_TABLET | Freq: Every day | ORAL | 1 refills | Status: DC

## 2020-11-18 MED ORDER — HYDROXYZINE HCL 50 MG PO TABS: 50.0000 mg | ORAL_TABLET | Freq: Every day | ORAL | 1 refills | Status: DC

## 2020-11-18 MED ORDER — PROPRANOLOL HCL 20 MG PO TABS: 20.0000 mg | ORAL_TABLET | Freq: Every day | ORAL | 1 refills | Status: DC

## 2020-11-18 MED ORDER — CLOZAPINE 100 MG PO TABS: 500.0000 mg | ORAL_TABLET | Freq: Every day | ORAL | 1 refills | Status: DC

## 2020-11-18 MED ORDER — LITHIUM CARBONATE ER 450 MG PO TBCR: 450.0000 mg | EXTENDED_RELEASE_TABLET | Freq: Every day | ORAL | 1 refills | Status: DC

## 2020-11-18 MED ORDER — TRAZODONE HCL 100 MG PO TABS: 100.0000 mg | ORAL_TABLET | Freq: Every day | ORAL | 1 refills | Status: DC

## 2020-11-18 MED ORDER — LITHIUM CARBONATE ER 300 MG PO TBCR: 300.0000 mg | EXTENDED_RELEASE_TABLET | Freq: Every morning | ORAL | 1 refills | Status: DC

## 2020-11-18 MED ORDER — OLANZAPINE 20 MG PO TABS: 20.0000 mg | ORAL_TABLET | Freq: Every day | ORAL | 1 refills | Status: DC

## 2020-11-18 NOTE — Progress Notes (Signed)
Patient presents with sad, flat affect. Denies SI, HI, AVH. Medication compliant. Appropriate with staff and peers. Reports day was as good as expected.  Encouragement and support provided. Safety checks maintained. Medications given as prescribed. Pt receptive and remains safe on unit with q 15 min checks

## 2020-11-18 NOTE — [facility] Group Notes (Signed)
LCSW Group Therapy Note   11/18/2020 2:40 PM  Type of Therapy and Topic:  Group Therapy:  Overcoming Obstacles   Participation Level:  None   Description of Group:    In this group patients will be encouraged to explore what they see as obstacles to their own wellness and recovery. They will be guided to discuss their thoughts, feelings, and behaviors related to these obstacles. The group will process together ways to cope with barriers, with attention given to specific choices patients can make. Each patient will be challenged to identify changes they are motivated to make in order to overcome their obstacles. This group will be process-oriented, with patients participating in exploration of their own experiences as well as giving and receiving support and challenge from other group members.   Therapeutic Goals: 1. Patient will identify personal and current obstacles as they relate to admission. 2. Patient will identify barriers that currently interfere with their wellness or overcoming obstacles.  3. Patient will identify feelings, thought process and behaviors related to these barriers. 4. Patient will identify two changes they are willing to make to overcome these obstacles:      Summary of Patient Progress Patient came in, sat down and then left.    Therapeutic Modalities:   Cognitive Behavioral Therapy Solution Focused Therapy Motivational Interviewing Relapse Prevention Therapy  Simona Huh R. Algis Greenhouse, MSW, LCSW, [name] 11/18/2020 2:40 PM

## 2020-11-18 NOTE — Plan of Care (Signed)
  Problem: Education: Goal: Mental status will improve Outcome: Progressing Goal: Verbalization of understanding the information provided will improve Outcome: Progressing   Problem: Safety: Goal: Periods of time without injury will increase Outcome: Progressing   

## 2020-11-18 NOTE — Tx Team (Signed)
Interdisciplinary Treatment and Diagnostic Plan Update  11/18/2020 Time of Session: 9:00AM Lance [name] MRN: [id number]  Principal Diagnosis: Schizoaffective disorder, bipolar type [facility](HCC)  Secondary Diagnoses: Principal Problem:   Schizoaffective disorder, bipolar type (HCC) Active Problems:   Diabetes mellitus without complication (HCC)   Hyperlipidemia   Chronic anticoagulation   Tobacco use disorder   Current Medications:  Current Facility-Administered Medications  Medication Dose Route Frequency Provider Last Rate Last Admin  . acetaminophen (TYLENOL) tablet 650 mg  650 mg Oral Q6H PRN Dixon, Rashaun M, NP      . albuterol (VENTOLIN HFA) 108 (90 Base) MCG/ACT inhaler 1-2 puff  1-2 puff Inhalation Q6H PRN Dixon, Rashaun M, NP      . alum & mag hydroxide-simeth (MAALOX/MYLANTA) 200-200-20 MG/5ML suspension 30 mL  30 mL Oral Q4H PRN Dixon, Rashaun M, NP      . apixaban (ELIQUIS) tablet 5 mg  5 mg Oral BID Lerry Linerixon, Rashaun M, NP   5 mg at 11/18/20 0751  . cloZAPine (CLOZARIL) tablet 500 mg  500 mg Oral QHS Dixon, Rashaun M, NP   500 mg at 11/17/20 2110  . hydrOXYzine (ATARAX/VISTARIL) tablet 50 mg  50 mg Oral QHS Jesse SansFreeman, Megan M, MD   50 mg at 11/17/20 2110  . insulin aspart (novoLOG) injection 0-15 Units  0-15 Units Subcutaneous TID WC Jesse SansFreeman, Megan M, MD   2 Units at 11/17/20 1616  . insulin aspart (novoLOG) injection 0-5 Units  0-5 Units Subcutaneous QHS Jesse SansFreeman, Megan M, MD      . insulin glargine (LANTUS) injection 10 Units  10 Units Subcutaneous BID Jearld Leschixon, Rashaun M, NP   10 Units at 11/18/20 0752  . lithium carbonate (ESKALITH) CR tablet 450 mg  450 mg Oral QHS Jesse SansFreeman, Megan M, MD   450 mg at 11/17/20 1937  . lithium carbonate (LITHOBID) CR tablet 300 mg  300 mg Oral q AM Jesse SansFreeman, Megan M, MD   300 mg at 11/18/20 0753  . magnesium hydroxide (MILK OF MAGNESIA) suspension 30 mL  30 mL Oral Daily PRN Dixon, Rashaun M, NP      . metFORMIN (GLUCOPHAGE) tablet 1,000 mg  1,000 mg  Oral BID WC Dixon, Rashaun M, NP   1,000 mg at 11/18/20 0751  . OLANZapine (ZYPREXA) tablet 20 mg  20 mg Oral QHS Dixon, Rashaun M, NP   20 mg at 11/17/20 2110  . propranolol (INDERAL) tablet 20 mg  20 mg Oral QHS Jesse SansFreeman, Megan M, MD   20 mg at 11/17/20 2111  . sertraline (ZOLOFT) tablet 50 mg  50 mg Oral Daily Lerry Linerixon, Rashaun M, NP   50 mg at 11/18/20 0752  . simvastatin (ZOCOR) tablet 40 mg  40 mg Oral q1800 Jearld Leschixon, Rashaun M, NP   40 mg at 11/17/20 1715  . traZODone (DESYREL) tablet 100 mg  100 mg Oral QHS Jesse SansFreeman, Megan M, MD   100 mg at 11/17/20 2111   PTA Medications: Medications Prior to Admission  Medication Sig Dispense Refill Last Dose  . ADVAIR DISKUS 250-50 MCG/ACT AEPB Inhale 1 puff into the lungs 2 (two) times daily.     Marland Kitchen. albuterol (VENTOLIN HFA) 108 (90 Base) MCG/ACT inhaler Inhale 1-2 puffs into the lungs every 6 (six) hours as needed for wheezing or shortness of breath. 18 g 1   . apixaban (ELIQUIS) 5 MG TABS tablet Take 1 tablet (5 mg total) by mouth 2 (two) times daily. 60 tablet 6   . cloZAPine (CLOZARIL) 100 MG  tablet Take 5 tablets (500 mg total) by mouth at bedtime. 150 tablet 1   . glucose blood (FORA V30A BLOOD GLUCOSE TEST) test strip Use as instructed 100 each 12   . hydrOXYzine (ATARAX/VISTARIL) 50 MG tablet Take 2 tablets (100 mg total) by mouth at bedtime. 60 tablet 1   . LEVEMIR FLEXTOUCH 100 UNIT/ML FlexPen Inject 10 Units into the skin 2 (two) times daily.     Marland Kitchen lisinopril (ZESTRIL) 2.5 MG tablet Take 1 tablet (2.5 mg total) by mouth daily. 30 tablet 1   . lithium carbonate (ESKALITH) 450 MG CR tablet Take 1 tablet (450 mg total) by mouth every 12 (twelve) hours. 60 tablet 1   . metFORMIN (GLUCOPHAGE) 1000 MG tablet Take 1 tablet (1,000 mg total) by mouth 2 (two) times daily with a meal. 60 tablet 1   . NOVOFINE AUTOCOVER PEN NEEDLE 30G X 8 MM MISC      . OLANZapine (ZYPREXA) 20 MG tablet Take 20 mg by mouth at bedtime.     . Omega-3 Fatty Acids (FISH OIL) 1000  MG CAPS Take 1 capsule by mouth daily.     . propranolol (INDERAL) 10 MG tablet Take 3 tablets (30 mg total) by mouth at bedtime. 90 tablet 1   . sertraline (ZOLOFT) 50 MG tablet Take 1 tablet (50 mg total) by mouth daily. 30 tablet 1   . simvastatin (ZOCOR) 40 MG tablet Take 1 tablet (40 mg total) by mouth daily at 6 PM. 30 tablet 6   . traZODone (DESYREL) 100 MG tablet Take 1 tablet (100 mg total) by mouth at bedtime. 30 tablet 1     Patient Stressors: Educational concerns Medication change or noncompliance  Patient Strengths: Barrister's clerk for treatment/growth  Treatment Modalities: Medication Management, Group therapy, Case management,  1 to 1 session with clinician, Psychoeducation, Recreational therapy.   Physician Treatment Plan for Primary Diagnosis: Schizoaffective disorder, bipolar type (HCC) Long Term Goal(s): Improvement in symptoms so as ready for discharge Improvement in symptoms so as ready for discharge   Short Term Goals: Ability to identify changes in lifestyle to reduce recurrence of condition will improve Ability to verbalize feelings will improve Ability to disclose and discuss suicidal ideas Ability to demonstrate self-control will improve Ability to identify and develop effective coping behaviors will improve Ability to maintain clinical measurements within normal limits will improve Compliance with prescribed medications will improve Ability to identify triggers associated with substance abuse/mental health issues will improve Ability to identify changes in lifestyle to reduce recurrence of condition will improve Ability to verbalize feelings will improve Ability to disclose and discuss suicidal ideas Ability to demonstrate self-control will improve Ability to identify and develop effective coping behaviors will improve Ability to maintain clinical measurements within normal limits will improve Compliance with prescribed medications will  improve Ability to identify triggers associated with substance abuse/mental health issues will improve  Medication Management: Evaluate patient's response, side effects, and tolerance of medication regimen.  Therapeutic Interventions: 1 to 1 sessions, Unit Group sessions and Medication administration.  Evaluation of Outcomes: Progressing  Physician Treatment Plan for Secondary Diagnosis: Principal Problem:   Schizoaffective disorder, bipolar type (HCC) Active Problems:   Diabetes mellitus without complication (HCC)   Hyperlipidemia   Chronic anticoagulation   Tobacco use disorder  Long Term Goal(s): Improvement in symptoms so as ready for discharge Improvement in symptoms so as ready for discharge   Short Term Goals: Ability to identify changes in lifestyle to reduce recurrence  of condition will improve Ability to verbalize feelings will improve Ability to disclose and discuss suicidal ideas Ability to demonstrate self-control will improve Ability to identify and develop effective coping behaviors will improve Ability to maintain clinical measurements within normal limits will improve Compliance with prescribed medications will improve Ability to identify triggers associated with substance abuse/mental health issues will improve Ability to identify changes in lifestyle to reduce recurrence of condition will improve Ability to verbalize feelings will improve Ability to disclose and discuss suicidal ideas Ability to demonstrate self-control will improve Ability to identify and develop effective coping behaviors will improve Ability to maintain clinical measurements within normal limits will improve Compliance with prescribed medications will improve Ability to identify triggers associated with substance abuse/mental health issues will improve     Medication Management: Evaluate patient's response, side effects, and tolerance of medication regimen.  Therapeutic Interventions: 1 to  1 sessions, Unit Group sessions and Medication administration.  Evaluation of Outcomes: Progressing   RN Treatment Plan for Primary Diagnosis: Schizoaffective disorder, bipolar type (HCC) Long Term Goal(s): Knowledge of disease and therapeutic regimen to maintain health will improve  Short Term Goals: Ability to verbalize frustration and anger appropriately will improve, Ability to demonstrate self-control, Ability to participate in decision making will improve, Ability to verbalize feelings will improve, Ability to disclose and discuss suicidal ideas, Ability to identify and develop effective coping behaviors will improve and Compliance with prescribed medications will improve  Medication Management: RN will administer medications as ordered by provider, will assess and evaluate patient's response and provide education to patient for prescribed medication. RN will report any adverse and/or side effects to prescribing provider.  Therapeutic Interventions: 1 on 1 counseling sessions, Psychoeducation, Medication administration, Evaluate responses to treatment, Monitor vital signs and CBGs as ordered, Perform/monitor CIWA, COWS, AIMS and Fall Risk screenings as ordered, Perform wound care treatments as ordered.  Evaluation of Outcomes: Progressing   LCSW Treatment Plan for Primary Diagnosis: Schizoaffective disorder, bipolar type (HCC) Long Term Goal(s): Safe transition to appropriate next level of care at discharge, Engage patient in therapeutic group addressing interpersonal concerns.  Short Term Goals: Engage patient in aftercare planning with referrals and resources, Increase social support, Increase ability to appropriately verbalize feelings, Increase emotional regulation, Facilitate acceptance of mental health diagnosis and concerns and Increase skills for wellness and recovery  Therapeutic Interventions: Assess for all discharge needs, 1 to 1 time with Social worker, Explore available  resources and support systems, Assess for adequacy in community support network, Educate family and significant other(s) on suicide prevention, Complete Psychosocial Assessment, Interpersonal group therapy.  Evaluation of Outcomes: Progressing   Progress in Treatment: Attending groups: Yes. and No. Participating in groups: No. Taking medication as prescribed: Yes. Toleration medication: Yes. Family/Significant other contact made: Yes, individual(s) contacted:  legal guardian. Patient understands diagnosis: No. Discussing patient identified problems/goals with staff: Yes. Medical problems stabilized or resolved: Yes. Denies suicidal/homicidal ideation: No. Issues/concerns per patient self-inventory: No. Other: none  New problem(s) identified: No, Describe:  none  New Short Term/Long Term Goal(s): elimination of symptoms of psychosis, medication management for mood stabilization; development of comprehensive mental wellness plan. Update 11/18/20: No changes at this time.  Patient Goals:  "They don't like me at that group home and I don't like them.  I've been suicidal and homicidal.  I can't keep living with these people." Update 11/18/20: No changes at this time.  Discharge Plan or Barriers: CSW will assist the patient in developing appropriate discharge plans. Update  11/18/20: No changes at this time.  Reason for Continuation of Hospitalization: Anxiety Delusions  Depression Hallucinations Homicidal ideation Medication stabilization Suicidal ideation  Estimated Length of Stay:  TBD   Recreational Therapy: Patient Stressors: N/A Patient Goal: Patient will engage in groups without prompting or encouragement from LRT x3 group sessions within 5 recreation therapy group sessions.  Attendees: Patient:  11/18/2020 9:14 AM  Physician: Dr. Neale Burly, MD 11/18/2020 9:14 AM  Nursing: 11/18/2020 9:14 AM  RN Care Manager: 11/18/2020 9:14 AM  Social Worker: Penni Homans, MSW, LCSW 11/18/2020  9:14 AM  Recreational Therapist: Garret Reddish, Drue Flirt, LRT 11/18/2020 9:14 AM  Other: Vilma Meckel. Algis Greenhouse, MSW, [place], [name] 11/18/2020 9:14 AM  Other: Kiva Swaz[place], MSW, LCSW-A 11/18/2020 9:14 AM  Other: 11/18/2020 9:14 AM    Scribe for Treatment Team: Glenis Smoker, LCSW 11/18/2020 9:14 AM

## 2020-11-18 NOTE — Progress Notes (Signed)
[facility] MD Progress Note  11/18/2020 12:58 PM Lance [name]  MRN:  [id number]   CC "I'm okay"  Subjective: 45 year old male with schizoaffective disorder bipolar type presenting with suicidal ideations and homicidal ideations.  No acute events overnight, medication compliant, attending to ADLs.  Today patient continues to perseverate on wanting a new group home.  However he denies suicidal ideations, homicidal ideations, visual hallucinations, auditory hallucinations.  Today he asks if he can return to the group home while they are waiting to find a new one.  He specifically denies any intent to run away, hit somebody else, or act up in the group home.  He is aware that most behavioral make it difficult to place him into a new group home.  He requests that I call the Zenaida Niece to let her know that he still wants to go to a new group home.  Contacted [name] at [phone].  I updated her on patient's progress here in the hospital.  She agrees that patient can return to group home while awaiting new placement.  Today, she has not had any leads on a new placement.  However, she has reached out to [facility] and her contacts in the area to start the process.  [facility]  They state that they are willing to take Kendra back to the group home.  They do note that he is out of all his psychiatric medications and will need refills sent to the pharmacy.  They also requested a new [place] 2 form.  They will be available to take him back tomorrow. Principal Problem: Schizoaffective disorder, bipolar type (HCC) Diagnosis: Principal Problem:   Schizoaffective disorder, bipolar type (HCC) Active Problems:   Diabetes mellitus without complication (HCC)   Hyperlipidemia   Chronic anticoagulation   Tobacco use disorder  Total Time spent with patient: 30 minutes  Past Psychiatric History: See H&P  Past Medical History:  Past Medical History:  Diagnosis Date  . Depression   . Diabetes mellitus  without complication (HCC)   . Hyperlipidemia   . Hypertension   . Lupus anticoagulant disorder (HCC) 06/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  . PE (pulmonary thromboembolism) (HCC)   . Schizo affective schizophrenia (HCC)   . Supratherapeutic INR 11/19/2016   History reviewed. No pertinent surgical history. Family History:  Family History  Problem Relation Age of Onset  . CAD Mother   . CAD Sister    Family Psychiatric  History: See H&P Social History:  Social History   Substance and Sexual Activity  Alcohol Use No   Comment: occassionally     Social History   Substance and Sexual Activity  Drug Use Not Currently  . Types: Marijuana   Comment: Last use 11/29/16    Social History   Socioeconomic History  . Marital status: Single    Spouse name: Not on file  . Number of children: Not on file  . Years of education: Not on file  . Highest education level: Not on file  Occupational History  . Not on file  Tobacco Use  . Smoking status: Current Every Day Smoker    Packs/day: 1.00    Years: 23.00    Pack years: 23.00    Types: Cigarettes  . Smokeless tobacco: Never Used  . Tobacco comment: will provide material  Substance and Sexual Activity  . Alcohol use: No    Comment: occassionally  . Drug use: Not Currently    Types:  Marijuana    Comment: Last use 11/29/16  . Sexual activity: Never    Birth control/protection: None    Comment: occasional marijuana- none recently  Other Topics Concern  . Not on file  Social History Narrative   From a group home in [place]   Social Determinants of Health   Financial Resource Strain: Not on file  Food Insecurity: Not on file  Transportation Needs: Not on file  Physical Activity: Not on file  Stress: Not on file  Social Connections: Not on file   Additional Social History:                         Sleep: Good  Appetite:  Good  Current Medications: Current Facility-Administered Medications   Medication Dose Route Frequency Provider Last Rate Last Admin  . acetaminophen (TYLENOL) tablet 650 mg  650 mg Oral Q6H PRN Dixon, Rashaun M, NP      . albuterol (VENTOLIN HFA) 108 (90 Base) MCG/ACT inhaler 1-2 puff  1-2 puff Inhalation Q6H PRN Dixon, Rashaun M, NP      . alum & mag hydroxide-simeth (MAALOX/MYLANTA) 200-200-20 MG/5ML suspension 30 mL  30 mL Oral Q4H PRN Dixon, Rashaun M, NP      . apixaban (ELIQUIS) tablet 5 mg  5 mg Oral BID Lerry Liner M, NP   5 mg at 11/18/20 0751  . cloZAPine (CLOZARIL) tablet 500 mg  500 mg Oral QHS Dixon, Rashaun M, NP   500 mg at 11/17/20 2110  . hydrOXYzine (ATARAX/VISTARIL) tablet 50 mg  50 mg Oral QHS [name], MD   50 mg at 11/17/20 2110  . insulin aspart (novoLOG) injection 0-15 Units  0-15 Units Subcutaneous TID WC [name], MD   2 Units at 11/18/20 1131  . insulin aspart (novoLOG) injection 0-5 Units  0-5 Units Subcutaneous QHS [name], MD      . insulin glargine (LANTUS) injection 10 Units  10 Units Subcutaneous BID Jearld Lesch, NP   10 Units at 11/18/20 0752  . lithium carbonate (ESKALITH) CR tablet 450 mg  450 mg Oral QHS [name], MD   450 mg at 11/17/20 1937  . lithium carbonate (LITHOBID) CR tablet 300 mg  300 mg Oral q AM [name], MD   300 mg at 11/18/20 0753  . magnesium hydroxide (MILK OF MAGNESIA) suspension 30 mL  30 mL Oral Daily PRN Dixon, Rashaun M, NP      . metFORMIN (GLUCOPHAGE) tablet 1,000 mg  1,000 mg Oral BID WC Dixon, Rashaun M, NP   1,000 mg at 11/18/20 0751  . OLANZapine (ZYPREXA) tablet 20 mg  20 mg Oral QHS Dixon, Rashaun M, NP   20 mg at 11/17/20 2110  . propranolol (INDERAL) tablet 20 mg  20 mg Oral QHS [name], MD   20 mg at 11/17/20 2111  . sertraline (ZOLOFT) tablet 50 mg  50 mg Oral Daily Lerry Liner M, NP   50 mg at 11/18/20 0752  . simvastatin (ZOCOR) tablet 40 mg  40 mg Oral q1800 Jearld Lesch, NP   40 mg at 11/17/20 1715  . traZODone (DESYREL)  tablet 100 mg  100 mg Oral QHS [name], MD   100 mg at 11/17/20 2111    Lab Results:  Results for orders placed or performed during the hospital encounter of 11/11/20 (from the past 48 hour(s))  Glucose, capillary  Status: Abnormal   Collection Time: 11/16/20  4:09 PM  Result Value Ref Range   Glucose-Capillary 134 (H) 70 - 99 mg/dL    Comment: Glucose reference range applies only to samples taken after fasting for at least 8 hours.  Glucose, capillary     Status: Abnormal   Collection Time: 11/16/20  8:34 PM  Result Value Ref Range   Glucose-Capillary 105 (H) 70 - 99 mg/dL    Comment: Glucose reference range applies only to samples taken after fasting for at least 8 hours.  Glucose, capillary     Status: Abnormal   Collection Time: 11/17/20  6:56 AM  Result Value Ref Range   Glucose-Capillary 116 (H) 70 - 99 mg/dL    Comment: Glucose reference range applies only to samples taken after fasting for at least 8 hours.  Glucose, capillary     Status: Abnormal   Collection Time: 11/17/20 11:21 AM  Result Value Ref Range   Glucose-Capillary 105 (H) 70 - 99 mg/dL    Comment: Glucose reference range applies only to samples taken after fasting for at least 8 hours.   Comment 1 Notify RN   Glucose, capillary     Status: Abnormal   Collection Time: 11/17/20  4:10 PM  Result Value Ref Range   Glucose-Capillary 141 (H) 70 - 99 mg/dL    Comment: Glucose reference range applies only to samples taken after fasting for at least 8 hours.   Comment 1 Notify RN   Glucose, capillary     Status: Abnormal   Collection Time: 11/17/20  8:20 PM  Result Value Ref Range   Glucose-Capillary 128 (H) 70 - 99 mg/dL    Comment: Glucose reference range applies only to samples taken after fasting for at least 8 hours.  Glucose, capillary     Status: Abnormal   Collection Time: 11/18/20  6:56 AM  Result Value Ref Range   Glucose-Capillary 117 (H) 70 - 99 mg/dL    Comment: Glucose reference range  applies only to samples taken after fasting for at least 8 hours.  Glucose, capillary     Status: Abnormal   Collection Time: 11/18/20 11:11 AM  Result Value Ref Range   Glucose-Capillary 121 (H) 70 - 99 mg/dL    Comment: Glucose reference range applies only to samples taken after fasting for at least 8 hours.    Blood Alcohol level:  Lab Results  Component Value Date   ETH <10 11/09/2020   ETH <10 09/19/2020    Metabolic Disorder Labs: Lab Results  Component Value Date   HGBA1C 5.4 11/12/2020   MPG 108 11/12/2020   MPG 99.67 09/20/2020   Lab Results  Component Value Date   PROLACTIN 2.0 (L) 08/31/2016   PROLACTIN 4.8 05/22/2016   Lab Results  Component Value Date   CHOL 157 09/20/2020   TRIG 447 (H) 09/20/2020   HDL 32 (L) 09/20/2020   CHOLHDL 4.9 09/20/2020   VLDL UNABLE TO CALCULATE IF TRIGLYCERIDE OVER 400 mg/dL 83/15/1761   LDLCALC UNABLE TO CALCULATE IF TRIGLYCERIDE OVER 400 mg/dL 60/73/7106   LDLCALC 53 07/16/2020    Physical Findings: AIMS: Facial and Oral Movements Muscles of Facial Expression: None, normal Lips and Perioral Area: None, normal Jaw: None, normal Tongue: None, normal,Extremity Movements Upper (arms, wrists, hands, fingers): None, normal Lower (legs, knees, ankles, toes): None, normal, Trunk Movements Neck, shoulders, hips: None, normal, Overall Severity Severity of abnormal movements (highest score from questions above): None, normal Incapacitation due to  abnormal movements: None, normal Patient's awareness of abnormal movements (rate only patient's report): No Awareness, Dental Status Current problems with teeth and/or dentures?: No Does patient usually wear dentures?: No  CIWA:    COWS:     Musculoskeletal: Strength & Muscle Tone: within normal limits Gait & Station: normal Patient leans: N/A  Psychiatric Specialty Exam:  Presentation  General Appearance: Casual  Eye Contact:Fair  Speech:Clear and Coherent  Speech  Volume:Normal  Handedness:Right   Mood and Affect  Mood:Euthymic  Affect:Congruent   Thought Process  Thought Processes:Goal Directed  Descriptions of Associations:Intact  Orientation:Full (Time, Place and Person)  Thought Content:Perseveration  History of Schizophrenia/Schizoaffective disorder:Yes  Duration of Psychotic Symptoms:Greater than six months  Hallucinations:Hallucinations: None  Ideas of Reference:None  Suicidal Thoughts:Suicidal Thoughts: No  Homicidal Thoughts:Homicidal Thoughts: No   Sensorium  Memory:Immediate Fair; Recent Fair; Remote Fair  Judgment:Intact  Insight:Present   Executive Functions  Concentration:Fair  Attention Span:Fair  Recall:Fair  Fund of Knowledge:Fair  Language:Fair   Psychomotor Activity  Psychomotor Activity:Psychomotor Activity: Normal   Assets  Assets:Communication Skills; Desire for Improvement; Financial Resources/Insurance; Housing; Leisure Time; Resilience; Social Support   Sleep  Sleep:Sleep: Fair Number of Hours of Sleep: 7.5    Physical Exam: Physical Exam ROS Blood pressure 127/79, pulse 87, temperature (!) 97 F (36.1 C), temperature source Oral, resp. rate 18, height 5\' 10"  (1.778 m), weight 82.6 kg, SpO2 91 %. Body mass index is 26.13 kg/m.   Treatment Plan Summary: Daily contact with patient to assess and evaluate symptoms and progress in treatment and Medication management 45 year old male with schizoaffective disorder bipolar type.  He is no longer endorsing suicidal or homicidal ideations.  He continues to perseverate on wanting a new group home, but is willing to return to his current group home until one is found.  Plan of care discussed with guardian and group home, and both are agreeable to him returning to group home tomorrow.  Jesse SansMegan M Aminta Sakurai, MD 11/18/2020, 12:58 PM

## 2020-11-18 NOTE — [place] FL2 (Signed)
[place] MEDICAID FL2 LEVEL OF CARE SCREENING TOOL     IDENTIFICATION  Patient Name: Lance [name] Birthdate: 10-30-1975 Sex: male Admission Date (Current Location): 11/11/2020  [place] and [place] Number:  Chiropodist and Address:  [facility], [address], [place], [place] [address]      Provider Number: [id number]  Attending Physician Name and Address:  [name], MD  Relative Name and Phone Number:  [name], legal guardian, [phone]    Current Level of Care: Hospital Recommended Level of Care: Family Care Home,Assisted Living Facility,Other (Comment) (Group Home) Prior Approval Number:    Date Approved/Denied:   PASRR Number:    Discharge Plan: Other (Comment) (Group Home, Family Care Home, Assisted Living Facility)    Current Diagnoses: Patient Active Problem List   Diagnosis Date Noted  . Alcohol abuse 04/12/2018  . Tobacco use disorder 09/01/2016  . Schizoaffective disorder, bipolar type (HCC) 05/22/2016  . Diabetes mellitus without complication (HCC) 05/21/2016  . Hyperlipidemia 05/21/2016  . History of pulmonary embolism 05/21/2016  . Chronic anticoagulation 05/21/2016  . Hypertension 09/25/2015    Orientation RESPIRATION BLADDER Height & Weight     Self,Time,Situation,Place  Normal Continent Weight: 182 lb 1.6 oz (82.6 kg) Height:  5\' 10"  (177.8 cm)  BEHAVIORAL SYMPTOMS/MOOD NEUROLOGICAL BOWEL NUTRITION STATUS   (NA)  (NA) Continent  (Regular)  AMBULATORY STATUS COMMUNICATION OF NEEDS Skin   Independent Verbally Normal                       Personal Care Assistance Level of Assistance   (NA)           Functional Limitations Info   (NA)          SPECIAL CARE FACTORS FREQUENCY  Blood pressure,Diabetic urine testing (Hyperlipidemia)                    Contractures Contractures Info: Not present    Additional Factors Info  Allergies,Code Status Code Status Info:  Full Allergies Info: Penicillins           Current Medications (11/18/2020):  This is the current hospital active medication list Current Facility-Administered Medications  Medication Dose Route Frequency Provider Last Rate Last Admin  . acetaminophen (TYLENOL) tablet 650 mg  650 mg Oral Q6H PRN Dixon, Rashaun M, NP      . albuterol (VENTOLIN HFA) 108 (90 Base) MCG/ACT inhaler 1-2 puff  1-2 puff Inhalation Q6H PRN Dixon, Rashaun M, NP      . alum & mag hydroxide-simeth (MAALOX/MYLANTA) 200-200-20 MG/5ML suspension 30 mL  30 mL Oral Q4H PRN Dixon, Rashaun M, NP      . apixaban (ELIQUIS) tablet 5 mg  5 mg Oral BID 07-14-1985 M, NP   5 mg at 11/18/20 0751  . cloZAPine (CLOZARIL) tablet 500 mg  500 mg Oral QHS Dixon, Rashaun M, NP   500 mg at 11/17/20 2110  . hydrOXYzine (ATARAX/VISTARIL) tablet 50 mg  50 mg Oral QHS 2111, MD   50 mg at 11/17/20 2110  . insulin aspart (novoLOG) injection 0-15 Units  0-15 Units Subcutaneous TID WC 08-14-1984, MD   2 Units at 11/18/20 1131  . insulin aspart (novoLOG) injection 0-5 Units  0-5 Units Subcutaneous QHS 01/18/21 M, MD      . insulin glargine (LANTUS) injection 10 Units  10 Units Subcutaneous BID M, NP   10 Units  at 11/18/20 0752  . lithium carbonate (ESKALITH) CR tablet 450 mg  450 mg Oral QHS [name], MD   450 mg at 11/17/20 1937  . lithium carbonate (LITHOBID) CR tablet 300 mg  300 mg Oral q AM [name], MD   300 mg at 11/18/20 0753  . magnesium hydroxide (MILK OF MAGNESIA) suspension 30 mL  30 mL Oral Daily PRN Dixon, Rashaun M, NP      . metFORMIN (GLUCOPHAGE) tablet 1,000 mg  1,000 mg Oral BID WC Dixon, Rashaun M, NP   1,000 mg at 11/18/20 0751  . OLANZapine (ZYPREXA) tablet 20 mg  20 mg Oral QHS Dixon, Rashaun M, NP   20 mg at 11/17/20 2110  . propranolol (INDERAL) tablet 20 mg  20 mg Oral QHS [name], MD   20 mg at 11/17/20 2111  . sertraline (ZOLOFT) tablet 50 mg  50 mg Oral  Daily Lerry Liner M, NP   50 mg at 11/18/20 0752  . simvastatin (ZOCOR) tablet 40 mg  40 mg Oral q1800 Jearld Lesch, NP   40 mg at 11/17/20 1715  . traZODone (DESYREL) tablet 100 mg  100 mg Oral QHS [name], MD   100 mg at 11/17/20 2111     Discharge Medications: Please see discharge summary for a list of discharge medications.  Relevant Imaging Results:  Relevant Lab Results:   Additional Information    [name], LCSW

## 2020-11-18 NOTE — Progress Notes (Signed)
Pt has been med compliant, and cooperative. Pt is irritable and blaming others that nothing is being done to help him. He complains of his group home. He will not allow me to assess his pych needs.He has a worried, grimaced affect. Torrie Mayers RN

## 2020-11-18 NOTE — Plan of Care (Signed)
Pt was educated on care plan and verbalizes understanding. Torrie Mayers RN Problem: Education: Goal: Knowledge of [place] [facility] Education information/materials will improve Outcome: Progressing Goal: Emotional status will improve Outcome: Progressing Goal: Mental status will improve Outcome: Progressing Goal: Verbalization of understanding the information provided will improve Outcome: Progressing   Problem: Safety: Goal: Periods of time without injury will increase Outcome: Progressing   Problem: Education: Goal: Utilization of techniques to improve thought processes will improve Outcome: Progressing Goal: Knowledge of the prescribed therapeutic regimen will improve Outcome: Progressing   Problem: Safety: Goal: Ability to disclose and discuss suicidal ideas will improve Outcome: Progressing Goal: Ability to identify and utilize support systems that promote safety will improve Outcome: Progressing

## 2020-11-18 NOTE — Progress Notes (Signed)
Recreation Therapy Notes  Date: 11/18/2020  Time: 10:00 am   Location: Craft room    Behavioral response: N/A   Intervention Topic: Stress Management    Discussion/Intervention: Patient did not attend group.   Clinical Observations/Feedback:  Patient did not attend group.   Vaughn Frieze LRT/CTRS        Lance [name] 11/18/2020 11:26 AM 

## 2020-11-19 LAB — GLUCOSE, CAPILLARY: Glucose-Capillary: 117 mg/dL — ABNORMAL HIGH (ref 70–99)

## 2020-11-19 NOTE — BHH Counselor (Signed)
CSW called and spoke with the patient's legal guardian, Lance [name], [phone]..  CSW informed of pt's discharge.  Guardian reports no concerns or objections.    CSW completed IM with guardian.  CSW, at guardians request, contacted pt's group home, [facility], [phone], to discuss discharge plans.  Group home is aware of discharge.  Group Home reports that they will pick the patient up at 11:30.  Penni Homans, MSW, LCSW 11/19/2020 9:13 AM

## 2020-11-19 NOTE — Progress Notes (Signed)
  [facility] Adult Case Management Discharge Plan :  Will you be returning to the same living situation after discharge:  Yes,  pt returning to group home At discharge, do you have transportation home?: Yes,  group home will provide transportation. Do you have the ability to pay for your medications: Yes,  Medicare Part A and B.  Release of information consent forms completed and in the chart;  Patient's signature needed at discharge.  Patient to Follow up at:  Follow-up Information    [address] [facility] [place], [employer]. Follow up.   Why: Your ACT team will meet with you at 11:00AM at 11/20/2020.  Thanks! Contact information: [address] [place] [address] [place] [address] [phone]               Next level of care provider has access to [facility] Link:no  Safety Planning and Suicide Prevention discussed: Yes,  SPE completed with the patient's guardian.  Have you used any form of tobacco in the last 30 days? (Cigarettes, Smokeless Tobacco, Cigars, and/or Pipes): Yes  Has patient been referred to the Quitline?: Patient refused referral  Patient has been referred for addiction treatment: Pt. refused referral  [name], LCSW 11/19/2020, 9:56 AM

## 2020-11-19 NOTE — Progress Notes (Signed)
Patient appears as sullen. He denies all. He had no complaints this morning. His last blood sugar was 118 so he got no meal coverage. Interaction is very brief and eye contact is avoided. He is mostly isolative to his room coming out for meals and snacks. He is medication compliant. He remains safe on the unit.

## 2020-11-19 NOTE — Progress Notes (Signed)
Recreation Therapy Notes  Date: 11/19/2020  Time: 9:30 am   Location: Craft room    Behavioral response: N/A   Intervention Topic: Coping Skills   Discussion/Intervention: Patient did not attend group.   Clinical Observations/Feedback:  Patient did not attend group.   Lance [name] LRT/CTRS        [name] 11/19/2020 11:44 AM

## 2020-11-19 NOTE — Progress Notes (Signed)
Patient was given discharge documentation. We went over his medication list and appointments. Client verbalized understanding. Client's belongings were returned to him. Client was escorted safely off of the unit with a staff member.

## 2020-11-19 NOTE — Discharge Summary (Signed)
Physician Discharge Summary Note  Patient:  Lance [name] is an 45 y.o., male MRN:  [id number] DOB:  07/09/1975 Patient phone:  [phone] (home)  Patient address:   [facility] [address] [place] [address],  Total Time spent with patient: 35 minutes- 25 minutes face-to-face contact with patient, 10 minutes documentation, coordination of care, scripts   Date of Admission:  11/11/2020 Date of Discharge: 11/19/2020  Reason for Admission: 45 year old male with schizoaffective bipolar type presenting with suicidal ideations to cut his wrists, and homicidal ideations toward his group home members with plan to stab them.  Principal Problem: Schizoaffective disorder, bipolar type [facility]) Discharge Diagnoses: Principal Problem:   Schizoaffective disorder, bipolar type (HCC) Active Problems:   Diabetes mellitus without complication (HCC)   Hyperlipidemia   Chronic anticoagulation   Tobacco use disorder   Past Psychiatric History: Long history of mental illness diagnosis of schizoaffective disorder. Frequently presents with similar complaints of psychotic symptoms,paranoia,as well as suicidal ideation. Past history of cutting and self injury. Minimal past history of aggression. Remote history of alcohol and marijuana abuse that has ceased since being in group home setting.   Past Medical History:  Past Medical History:  Diagnosis Date  . Depression   . Diabetes mellitus without complication (HCC)   . Hyperlipidemia   . Hypertension   . Lupus anticoagulant disorder (HCC) 06/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  . PE (pulmonary thromboembolism) (HCC)   . Schizo affective schizophrenia (HCC)   . Supratherapeutic INR 11/19/2016   History reviewed. No pertinent surgical history. Family History:  Family History  Problem Relation Age of Onset  . CAD Mother   . CAD Sister    Family Psychiatric  History: None reported Social History:  Social History    Substance and Sexual Activity  Alcohol Use No   Comment: occassionally     Social History   Substance and Sexual Activity  Drug Use Not Currently  . Types: Marijuana   Comment: Last use 11/29/16    Social History   Socioeconomic History  . Marital status: Single    Spouse name: Not on file  . Number of children: Not on file  . Years of education: Not on file  . Highest education level: Not on file  Occupational History  . Not on file  Tobacco Use  . Smoking status: Current Every Day Smoker    Packs/day: 1.00    Years: 23.00    Pack years: 23.00    Types: Cigarettes  . Smokeless tobacco: Never Used  . Tobacco comment: will provide material  Substance and Sexual Activity  . Alcohol use: No    Comment: occassionally  . Drug use: Not Currently    Types: Marijuana    Comment: Last use 11/29/16  . Sexual activity: Never    Birth control/protection: None    Comment: occasional marijuana- none recently  Other Topics Concern  . Not on file  Social History Narrative   From a group home in [place]   Social Determinants of Health   Financial Resource Strain: Not on file  Food Insecurity: Not on file  Transportation Needs: Not on file  Physical Activity: Not on file  Stress: Not on file  Social Connections: Not on file    Hospital Course:  45 year old male with schizoaffective bipolar type presenting with suicidal ideations to cut his wrists, and homicidal ideations toward his group home members with plan to stab them. Admission largely dominated  by patient discussing desire for a new group home. This is his fourth admission this year under similar circumstances. His guardian was contacted, and is working with his ACT Team to find alternate placement. While here all of his home medications were continued. His propranolol was decreased to 20 mg at bedtime due to hypotension. At time of discharge he denies suicidal ideations, homicidal ideations, visual hallucinations, and  auditory hallucinations. He also specifically denies any desire to verbally or physically hurt any group home members, or run away from group home.   Physical Findings: AIMS: Facial and Oral Movements Muscles of Facial Expression: None, normal Lips and Perioral Area: None, normal Jaw: None, normal Tongue: None, normal,Extremity Movements Upper (arms, wrists, hands, fingers): None, normal Lower (legs, knees, ankles, toes): None, normal, Trunk Movements Neck, shoulders, hips: None, normal, Overall Severity Severity of abnormal movements (highest score from questions above): None, normal Incapacitation due to abnormal movements: None, normal Patient's awareness of abnormal movements (rate only patient's report): No Awareness, Dental Status Current problems with teeth and/or dentures?: No Does patient usually wear dentures?: No  CIWA:    COWS:     Musculoskeletal: Strength & Muscle Tone: within normal limits Gait & Station: normal Patient leans: N/A                Psychiatric Specialty Exam:  Presentation  General Appearance: Casual  Eye Contact:Good  Speech:Clear and Coherent  Speech Volume:Normal  Handedness:Right   Mood and Affect  Mood:Euthymic  Affect:Congruent   Thought Process  Thought Processes:Goal Directed  Descriptions of Associations:Intact  Orientation:Full (Time, Place and Person)  Thought Content:Logical  History of Schizophrenia/Schizoaffective disorder:Yes  Duration of Psychotic Symptoms:Greater than six months  Hallucinations:Hallucinations: None  Ideas of Reference:None  Suicidal Thoughts:Suicidal Thoughts: No  Homicidal Thoughts:Homicidal Thoughts: No   Sensorium  Memory:Immediate Fair; Recent Fair; Remote Fair  Judgment:Intact  Insight:Present   Executive Functions  Concentration:Fair  Attention Span:Fair  Recall:Fair  Fund of Knowledge:Fair  Language:Fair   Psychomotor Activity  Psychomotor  Activity:Psychomotor Activity: Normal   Assets  Assets:Communication Skills; Desire for Improvement; Financial Resources/Insurance; Housing; Resilience; Social Support   Sleep  Sleep:Sleep: Good Number of Hours of Sleep: 9    Physical Exam: Physical Exam Vitals and nursing note reviewed.  Constitutional:      Appearance: Normal appearance.  HENT:     Head: Normocephalic and atraumatic.     Right Ear: External ear normal.     Left Ear: External ear normal.     Nose: Nose normal.     Mouth/Throat:     Mouth: Mucous membranes are moist.     Pharynx: Oropharynx is clear.  Eyes:     Extraocular Movements: Extraocular movements intact.     Conjunctiva/sclera: Conjunctivae normal.     Pupils: Pupils are equal, round, and reactive to light.  Cardiovascular:     Rate and Rhythm: Normal rate and regular rhythm.     Pulses: Normal pulses.  Pulmonary:     Effort: Pulmonary effort is normal.     Breath sounds: Normal breath sounds.  Abdominal:     General: Abdomen is flat.     Palpations: Abdomen is soft.  Musculoskeletal:        General: No swelling. Normal range of motion.     Cervical back: Normal range of motion and neck supple.  Skin:    General: Skin is warm and dry.  Neurological:     General: No focal deficit present.  Mental Status: He is alert and oriented to person, place, and time.  Psychiatric:        Mood and Affect: Mood normal.        Behavior: Behavior normal.        Thought Content: Thought content normal.        Judgment: Judgment normal.    Review of Systems  Constitutional: Negative.   HENT: Negative.   Eyes: Negative.   Respiratory: Negative.   Cardiovascular: Negative.   Gastrointestinal: Negative.   Genitourinary: Negative.   Musculoskeletal: Negative.   Skin: Negative.   Neurological: Negative.   Endo/Heme/Allergies: Positive for environmental allergies. Does not bruise/bleed easily.  Psychiatric/Behavioral: Negative for depression,  hallucinations, memory loss, substance abuse and suicidal ideas. The patient is not nervous/anxious and does not have insomnia.    Blood pressure 111/67, pulse 62, temperature 98.4 F (36.9 C), temperature source Oral, resp. rate 18, height 5\' 10"  (1.778 m), weight 82.6 kg, SpO2 99 %. Body mass index is 26.13 kg/m.   Have you used any form of tobacco in the last 30 days? (Cigarettes, Smokeless Tobacco, Cigars, and/or Pipes): Yes  Has this patient used any form of tobacco in the last 30 days? (Cigarettes, Smokeless Tobacco, Cigars, and/or Pipes)  Yes, A prescription for an FDA-approved tobacco cessation medication was offered at discharge and the patient refused  Blood Alcohol level:  Lab Results  Component Value Date   [facility] <10 11/09/2020   ETH <10 09/19/2020    Metabolic Disorder Labs:  Lab Results  Component Value Date   HGBA1C 5.4 11/12/2020   MPG 108 11/12/2020   MPG 99.67 09/20/2020   Lab Results  Component Value Date   PROLACTIN 2.0 (L) 08/31/2016   PROLACTIN 4.8 05/22/2016   Lab Results  Component Value Date   CHOL 157 09/20/2020   TRIG 447 (H) 09/20/2020   HDL 32 (L) 09/20/2020   CHOLHDL 4.9 09/20/2020   VLDL UNABLE TO CALCULATE IF TRIGLYCERIDE OVER 400 mg/dL 11/20/2020   LDLCALC UNABLE TO CALCULATE IF TRIGLYCERIDE OVER 400 mg/dL 22/07/5425   LDLCALC 53 07/16/2020    See Psychiatric Specialty Exam and Suicide Risk Assessment completed by Attending Physician prior to discharge.  Discharge destination:  Other:  group home  Is patient on multiple antipsychotic therapies at discharge:  Yes,   Do you recommend tapering to monotherapy for antipsychotics?  No   Has Patient had three or more failed trials of antipsychotic monotherapy by history:  Yes,   Antipsychotic medications that previously failed include:   1.  Zyprexa. and 2.  Clozapine.3. Saphris 4. Geodon  Recommended Plan for Multiple Antipsychotic Therapies: NA  Discharge Instructions    Diet Carb Modified    Complete by: As directed    Increase activity slowly   Complete by: As directed      Allergies as of 11/19/2020      Reactions   Penicillins Other (See Comments)   Reaction: "lockjaw" Has patient had a PCN reaction causing immediate rash, facial/tongue/throat swelling, SOB or lightheadedness with hypotension: No Has patient had a PCN reaction causing severe rash involving mucus membranes or skin necrosis: No Has patient had a PCN reaction that required hospitalization: No Has patient had a PCN reaction occurring within the last 10 years: No If all of the above answers are "NO", then may proceed with Cephalosporin use.      Medication List    STOP taking these medications   lisinopril 2.5 MG tablet Commonly known as:  ZESTRIL     TAKE these medications     Indication  Advair Diskus 250-50 MCG/ACT Aepb Generic drug: fluticasone-salmeterol Inhale 1 puff into the lungs 2 (two) times daily.  Indication: Asthma   albuterol 108 (90 Base) MCG/ACT inhaler Commonly known as: VENTOLIN HFA Inhale 1-2 puffs into the lungs every 6 (six) hours as needed for wheezing or shortness of breath.  Indication: Asthma   apixaban 5 MG Tabs tablet Commonly known as: ELIQUIS Take 1 tablet (5 mg total) by mouth 2 (two) times daily.  Indication: Blockage of Blood Vessel to Lung by a Particle   cloZAPine 100 MG tablet Commonly known as: CLOZARIL Take 5 tablets (500 mg total) by mouth at bedtime.  Indication: Schizophrenia   Fish Oil 1000 MG Caps Take 1 capsule by mouth daily.  Indication: CORONARY ARTERY DISEASE   FORA V30a Blood Glucose Test test strip Generic drug: glucose blood Use as instructed  Indication: Diabetes   hydrOXYzine 50 MG tablet Commonly known as: ATARAX/VISTARIL Take 1 tablet (50 mg total) by mouth at bedtime. What changed: how much to take  Indication: State of Being Sedated   Levemir FlexTouch 100 UNIT/ML FlexPen Generic drug: insulin detemir Inject 10 Units into  the skin 2 (two) times daily.  Indication: Type 2 Diabetes   lithium carbonate 450 MG CR tablet Commonly known as: ESKALITH Take 1 tablet (450 mg total) by mouth at bedtime. What changed: You were already taking a medication with the same name, and this prescription was added. Make sure you understand how and when to take each.  Indication: Schizoaffective Disorder   lithium carbonate 300 MG CR tablet Commonly known as: LITHOBID Take 1 tablet (300 mg total) by mouth in the morning. What changed:   medication strength  how much to take  when to take this  Indication: Schizoaffective Disorder   metFORMIN 1000 MG tablet Commonly known as: GLUCOPHAGE Take 1 tablet (1,000 mg total) by mouth 2 (two) times daily with a meal.  Indication: Type 2 Diabetes   NovoFine Autocover Pen Needle 30G X 8 MM Misc Generic drug: Insulin Pen Needle  Indication: glucose monitoring   OLANZapine 20 MG tablet Commonly known as: ZYPREXA Take 1 tablet (20 mg total) by mouth at bedtime.  Indication: Depressive Phase of Manic-Depression   propranolol 20 MG tablet Commonly known as: INDERAL Take 1 tablet (20 mg total) by mouth at bedtime. What changed:   medication strength  how much to take  Indication: Feeling Anxious   sertraline 50 MG tablet Commonly known as: ZOLOFT Take 1 tablet (50 mg total) by mouth daily.  Indication: Major Depressive Disorder, Obsessive Compulsive Disorder   simvastatin 40 MG tablet Commonly known as: ZOCOR Take 1 tablet (40 mg total) by mouth daily at 6 PM.  Indication: Nonfamilial Hypercholesterolemia   traZODone 100 MG tablet Commonly known as: DESYREL Take 1 tablet (100 mg total) by mouth at bedtime.  Indication: Trouble Sleeping        Follow-up recommendations:  Activity:  as tolerated Diet:  carb modified diet  Comments:  30-day scripts with 1 refill sent to [facility] in [place], [place] per group home request  Signed: Jesse SansMegan M Ronesha Heenan,  MD 11/19/2020, 9:18 AM

## 2020-11-19 NOTE — Progress Notes (Signed)
Recreation Therapy Notes  INPATIENT RECREATION TR PLAN  Patient Details Name: Lance [name] MRN: [id number] DOB: May 29, 1976 Today's Date: 11/19/2020  Rec Therapy Plan Is patient appropriate for Therapeutic Recreation?: Yes Treatment times per week: at least 3 Estimated Length of Stay: 5-7 days TR Treatment/Interventions: Group participation (Comment)  Discharge Criteria Pt will be discharged from therapy if:: Discharged Treatment plan/goals/alternatives discussed and agreed upon by:: Patient/family  Discharge Summary Short term goals set: Patient will identify 3 positive coping skills strategies to use for SI post d/c within 5 recreation therapy group sessions Short term goals met: Not met Progress toward goals comments: Groups attended Which groups?: Social skills Reason goals not met: Patient spent most of his time in his room Therapeutic equipment acquired: N/A Reason patient discharged from therapy: Discharge from hospital Pt/family agrees with progress & goals achieved: Yes Date patient discharged from therapy: 11/19/20   Ellanor Feuerstein 11/19/2020, 1:09 PM

## 2020-11-19 NOTE — Care Management Important Message (Signed)
Important Message  Patient Details  Name: Lance [name] MRN: [id number] Date of Birth: Jan 14, 1976   Medicare Important Message Given:  Other (see comment) (CSW went over the IM with the guardian verbally.  CSW provided the [facility] number.  Guardian indicated that she would not appeal the disharge at this time.)     [name], LCSW 11/19/2020, 11:01 AM

## 2020-11-19 NOTE — [facility] Suicide Risk Assessment (Signed)
[facility] Discharge Suicide Risk Assessment   Principal Problem: Schizoaffective disorder, bipolar type (HCC) Discharge Diagnoses: Principal Problem:   Schizoaffective disorder, bipolar type (HCC) Active Problems:   Diabetes mellitus without complication (HCC)   Hyperlipidemia   Chronic anticoagulation   Tobacco use disorder   Total Time spent with patient: 35 minutes- 25 minutes face-to-face contact with patient, 10 minutes documentation, coordination of care, scripts   Musculoskeletal: Strength & Muscle Tone: within normal limits Gait & Station: normal Patient leans: N/A  Psychiatric Specialty Exam  Presentation  General Appearance: Casual  Eye Contact:Good  Speech:Clear and Coherent  Speech Volume:Normal  Handedness:Right   Mood and Affect  Mood:Euthymic  Duration of Depression Symptoms: Greater than two weeks  Affect:Congruent   Thought Process  Thought Processes:Goal Directed  Descriptions of Associations:Intact  Orientation:Full (Time, Place and Person)  Thought Content:Logical  History of Schizophrenia/Schizoaffective disorder:Yes  Duration of Psychotic Symptoms:Greater than six months  Hallucinations:Hallucinations: None  Ideas of Reference:None  Suicidal Thoughts:Suicidal Thoughts: No  Homicidal Thoughts:Homicidal Thoughts: No   Sensorium  Memory:Immediate Fair; Recent Fair; Remote Fair  Judgment:Intact  Insight:Present   Executive Functions  Concentration:Fair  Attention Span:Fair  Recall:Fair  Fund of Knowledge:Fair  Language:Fair   Psychomotor Activity  Psychomotor Activity:Psychomotor Activity: Normal   Assets  Assets:Communication Skills; Desire for Improvement; Financial Resources/Insurance; Housing; Resilience; Social Support   Sleep  Sleep:Sleep: Good Number of Hours of Sleep: 9   Physical Exam: Physical Exam Vitals and nursing note reviewed.  Constitutional:      Appearance: Normal appearance.  HENT:      Head: Normocephalic and atraumatic.     Right Ear: External ear normal.     Left Ear: External ear normal.     Nose: Nose normal.     Mouth/Throat:     Mouth: Mucous membranes are moist.     Pharynx: Oropharynx is clear.  Eyes:     Extraocular Movements: Extraocular movements intact.     Conjunctiva/sclera: Conjunctivae normal.     Pupils: Pupils are equal, round, and reactive to light.  Cardiovascular:     Rate and Rhythm: Normal rate.     Pulses: Normal pulses.  Pulmonary:     Effort: Pulmonary effort is normal.  Abdominal:     General: Abdomen is flat.     Palpations: Abdomen is soft.  Musculoskeletal:        General: No swelling. Normal range of motion.     Cervical back: Normal range of motion and neck supple.  Skin:    General: Skin is warm and dry.  Neurological:     General: No focal deficit present.     Mental Status: He is alert and oriented to person, place, and time.  Psychiatric:        Mood and Affect: Mood normal.        Behavior: Behavior normal.        Thought Content: Thought content normal.        Judgment: Judgment normal.    Review of Systems  Constitutional: Negative.   HENT: Negative.   Eyes: Negative.   Respiratory: Negative.   Cardiovascular: Negative.   Gastrointestinal: Negative.   Genitourinary: Negative.   Musculoskeletal: Negative.   Skin: Negative.   Neurological: Negative.   Endo/Heme/Allergies: Positive for environmental allergies. Does not bruise/bleed easily.  Psychiatric/Behavioral: Negative for depression, hallucinations, memory loss, substance abuse and suicidal ideas. The patient is not nervous/anxious and does not have insomnia.    Blood pressure 111/67, pulse 62,  temperature 98.4 F (36.9 C), temperature source Oral, resp. rate 18, height 5\' 10"  (1.778 m), weight 82.6 kg, SpO2 99 %. Body mass index is 26.13 kg/m.  Mental Status Per Nursing Assessment::   On Admission:  NA  Demographic Factors:  Male and  Caucasian  Loss Factors: NA  Historical Factors: NA  Risk Reduction Factors:   Sense of responsibility to family, Religious beliefs about death, Living with another person, especially a relative, Positive social support, Positive therapeutic relationship and Positive coping skills or problem solving skills  Continued Clinical Symptoms:  Schizophrenia:   Paranoid or undifferentiated type  Cognitive Features That Contribute To Risk:  None    Suicide Risk:  Minimal: No identifiable suicidal ideation.  Patients presenting with no risk factors but with morbid ruminations; may be classified as minimal risk based on the severity of the depressive symptoms    Plan Of Care/Follow-up recommendations:  Activity:  as tolerated Diet:  carb modified diet  [phone], MD 11/19/2020, 9:16 AM

## 2020-11-19 NOTE — Plan of Care (Signed)
  Problem: Coping Skills Goal: STG - Patient will identify 3 positive coping skills strategies to use for SI post d/c within 5 recreation therapy group sessions Description: STG - Patient will identify 3 positive coping skills strategies to use for SI post d/c within 5 recreation therapy group sessions 11/19/2020 1307 by Ernest Haber, LRT Outcome: Not Applicable 07/22/7410 [id number] by Ernest Haber, LRT Outcome: Not Met (add Reason) Note: Patient spent most of his time in his room.

## 2020-11-30 ENCOUNTER — Other Ambulatory Visit: Payer: Self-pay

## 2020-11-30 ENCOUNTER — Emergency Department
Admission: EM | Admit: 2020-11-30 | Discharge: 2020-12-01 | Disposition: A | Discharge status: Other Acute Inpatient Hospital | Payer: Medicare Other | Attending: Emergency Medicine | Admitting: Emergency Medicine

## 2020-11-30 DIAGNOSIS — F17210 Nicotine dependence, cigarettes, uncomplicated: Secondary | ICD-10-CM | POA: Insufficient documentation

## 2020-11-30 DIAGNOSIS — Z7984 Long term (current) use of oral hypoglycemic drugs: Secondary | ICD-10-CM | POA: Diagnosis not present

## 2020-11-30 DIAGNOSIS — R45851 Suicidal ideations: Secondary | ICD-10-CM

## 2020-11-30 DIAGNOSIS — F319 Bipolar disorder, unspecified: Secondary | ICD-10-CM | POA: Diagnosis present

## 2020-11-30 DIAGNOSIS — I10 Essential (primary) hypertension: Secondary | ICD-10-CM | POA: Insufficient documentation

## 2020-11-30 DIAGNOSIS — Z20822 Contact with and (suspected) exposure to covid-19: Secondary | ICD-10-CM | POA: Diagnosis not present

## 2020-11-30 DIAGNOSIS — Z79899 Other long term (current) drug therapy: Secondary | ICD-10-CM | POA: Insufficient documentation

## 2020-11-30 DIAGNOSIS — Z7901 Long term (current) use of anticoagulants: Secondary | ICD-10-CM | POA: Diagnosis not present

## 2020-11-30 DIAGNOSIS — E119 Type 2 diabetes mellitus without complications: Secondary | ICD-10-CM | POA: Diagnosis not present

## 2020-11-30 DIAGNOSIS — F250 Schizoaffective disorder, bipolar type: Secondary | ICD-10-CM | POA: Diagnosis present

## 2020-11-30 DIAGNOSIS — R45850 Homicidal ideations: Secondary | ICD-10-CM | POA: Diagnosis not present

## 2020-11-30 LAB — CBG MONITORING, ED
Glucose-Capillary: 121 mg/dL — ABNORMAL HIGH (ref 70–99)
Glucose-Capillary: 184 mg/dL — ABNORMAL HIGH (ref 70–99)

## 2020-11-30 LAB — COMPREHENSIVE METABOLIC PANEL
ALT: 18 U/L (ref 0–44)
AST: 20 U/L (ref 15–41)
Albumin: 4.6 g/dL (ref 3.5–5.0)
Alkaline Phosphatase: 54 U/L (ref 38–126)
Anion gap: 8 (ref 5–15)
BUN: 22 mg/dL — ABNORMAL HIGH (ref 6–20)
CO2: 19 mmol/L — ABNORMAL LOW (ref 22–32)
Calcium: 9.7 mg/dL (ref 8.9–10.3)
Chloride: 113 mmol/L — ABNORMAL HIGH (ref 98–111)
Creatinine, Ser: 1.11 mg/dL (ref 0.61–1.24)
GFR, Estimated: >60 mL/min (ref >60)
Glucose, Bld: 165 mg/dL — ABNORMAL HIGH (ref 70–99)
Potassium: 3.7 mmol/L (ref 3.5–5.1)
Sodium: 140 mmol/L (ref 135–145)
Total Bilirubin: 0.7 mg/dL (ref 0.3–1.2)
Total Protein: 7 g/dL (ref 6.5–8.1)

## 2020-11-30 LAB — RESP PANEL BY RT-PCR (FLU A&B, COVID) ARPGX2
Influenza A by PCR: NEGATIVE
Influenza B by PCR: NEGATIVE
SARS Coronavirus 2 by RT PCR: NEGATIVE

## 2020-11-30 LAB — ETHANOL: Alcohol, Ethyl (B): <10 mg/dL (ref <10)

## 2020-11-30 LAB — CBC
HCT: 41.4 % (ref 39.0–52.0)
Hemoglobin: 14.4 g/dL (ref 13.0–17.0)
MCH: 32.2 pg (ref 26.0–34.0)
MCHC: 34.8 g/dL (ref 30.0–36.0)
MCV: 92.6 fL (ref 80.0–100.0)
Platelets: 191 10*3/uL (ref 150–400)
RBC: 4.47 MIL/uL (ref 4.22–5.81)
RDW: 12.9 % (ref 11.5–15.5)
WBC: 4.8 10*3/uL (ref 4.0–10.5)
nRBC: 0 % (ref 0.0–0.2)

## 2020-11-30 LAB — ACETAMINOPHEN LEVEL: Acetaminophen (Tylenol), Serum: <10 ug/mL — ABNORMAL LOW (ref 10–30)

## 2020-11-30 LAB — SALICYLATE LEVEL: Salicylate Lvl: <7 mg/dL — ABNORMAL LOW (ref 7.0–30.0)

## 2020-11-30 LAB — LITHIUM LEVEL: Lithium Lvl: 0.81 mmol/L (ref 0.60–1.20)

## 2020-11-30 MED ORDER — APIXABAN 5 MG PO TABS: 5.0000 mg | ORAL_TABLET | Freq: Two times a day (BID) | ORAL | Status: DC

## 2020-11-30 MED ORDER — CLOZAPINE 100 MG PO TABS: 500.0000 mg | ORAL_TABLET | Freq: Every day | ORAL | Status: DC

## 2020-11-30 MED ORDER — OLANZAPINE 10 MG PO TABS
20.0000 mg | ORAL_TABLET | Freq: Every day | ORAL | Status: DC
  Administered 2020-12-01: 20 mg via ORAL
  Filled 2020-11-30 (×2): qty 2

## 2020-11-30 MED ORDER — SIMVASTATIN 40 MG PO TABS
40.0000 mg | ORAL_TABLET | Freq: Every day | ORAL | Status: DC
  Administered 2020-12-01: 40 mg via ORAL
  Filled 2020-11-30: qty 1

## 2020-11-30 MED ORDER — CLOZAPINE 100 MG PO TABS
500.0000 mg | ORAL_TABLET | Freq: Every day | ORAL | Status: DC
  Administered 2020-11-30: 500 mg via ORAL
  Filled 2020-11-30: qty 5

## 2020-11-30 MED ORDER — APIXABAN 5 MG PO TABS
5.0000 mg | ORAL_TABLET | Freq: Two times a day (BID) | ORAL | Status: DC
  Administered 2020-11-30: 5 mg via ORAL
  Filled 2020-11-30 (×3): qty 1

## 2020-11-30 MED ORDER — PROPRANOLOL HCL 10 MG PO TABS
20.0000 mg | ORAL_TABLET | Freq: Every day | ORAL | Status: DC
  Administered 2020-12-01: 20 mg via ORAL
  Filled 2020-11-30: qty 2
  Filled 2020-11-30: qty 1

## 2020-11-30 MED ORDER — MOMETASONE FURO-FORMOTEROL FUM 200-5 MCG/ACT IN AERO: 2.0000 | INHALATION_SPRAY | Freq: Two times a day (BID) | RESPIRATORY_TRACT | Status: DC

## 2020-11-30 MED ORDER — ALBUTEROL SULFATE (2.5 MG/3ML) 0.083% IN NEBU
2.5000 mg | INHALATION_SOLUTION | Freq: Four times a day (QID) | RESPIRATORY_TRACT | Status: DC | PRN
  Filled 2020-11-30: qty 3

## 2020-11-30 MED ORDER — TRAZODONE HCL 100 MG PO TABS
100.0000 mg | ORAL_TABLET | Freq: Every day | ORAL | Status: DC
  Administered 2020-12-01: 100 mg via ORAL
  Filled 2020-11-30 (×2): qty 1

## 2020-11-30 MED ORDER — MOMETASONE FURO-FORMOTEROL FUM 200-5 MCG/ACT IN AERO
2.0000 | INHALATION_SPRAY | Freq: Two times a day (BID) | RESPIRATORY_TRACT | Status: DC
  Filled 2020-11-30: qty 8.8

## 2020-11-30 MED ORDER — HYDROXYZINE HCL 25 MG PO TABS
50.0000 mg | ORAL_TABLET | Freq: Every day | ORAL | Status: DC
  Administered 2020-12-01: 50 mg via ORAL
  Filled 2020-11-30 (×2): qty 2

## 2020-11-30 MED ORDER — SERTRALINE HCL 50 MG PO TABS
50.0000 mg | ORAL_TABLET | Freq: Every day | ORAL | Status: DC
  Administered 2020-12-01: 50 mg via ORAL
  Filled 2020-11-30: qty 1

## 2020-11-30 MED ORDER — INSULIN ASPART 100 UNIT/ML IJ SOLN
0.0000 [IU] | INTRAMUSCULAR | Status: DC
Start: 2020-11-30 — End: 2020-12-01
  Administered 2020-12-01 (×2): 2 [IU] via SUBCUTANEOUS
  Filled 2020-11-30 (×3): qty 1

## 2020-11-30 NOTE — ED Notes (Signed)
Patient transferred from ED to [facility] room 4 after screening for contraband. Report received from [facility], RN including Situation, Background, Assessment and Recommendations. Pt oriented to unit including Q15 minute rounds as well as the security cameras for their protection. Patient is alert and oriented, warm and dry in no acute distress. Patient denies SI, HI, and AVH. Pt. Encouraged to let this nurse know if needs arise. Reminded pt of need for urine sample.

## 2020-11-30 NOTE — ED Notes (Signed)
The following items placed in one of one labeled bag: jeans, sneakers, Gortney belt, blue ball cap, gum, black socks, red t shirt, plaid boxers. Black watch. Pt also has a large navy blue adias duffel bag

## 2020-11-30 NOTE — ED Notes (Signed)
Per report from [facility], RN, we are awaiting pharmacy to verify all meds to administer to pt. This nurse contacts pharmacy and they state they will be verifying meds shortly, pt notified on arrival of status of medications

## 2020-11-30 NOTE — Consult Note (Signed)
[facility] Face-to-Face Psychiatry Consult   Reason for Consult:  Psychiatric Evaluation Referring Physician:  Dr. Katrinka Blazing Patient Identification: Lance [name] MRN:  [id number] Principal Diagnosis: <principal problem not specified> Diagnosis:  Active Problems:   Schizoaffective disorder, bipolar type (HCC)   Total Time spent with patient: 1 hour  Subjective:   Lance [name] is a 45 y.o. male patient Pt states he has "wolf" in him. Pt states today he feels alienated in the group home. Pt states "it's a conspriacy against me". Pt states he is suicidal and also wants to hurt others at the group home. Pt [facility].  HPI:  Lance [name], 45 y.o., male patient presented to [facility] voluntarily from group home.  Patient seen by TTS and this provider; chart reviewed and consulted with Dr. Katrinka Blazing on 11/30/20.  On evaluation Lance [name] reports that he feels as though he doesn't fit in at the group home and as a result he wants to hurt people there.  Lance [name] has a history of making threats of hurting people and himself with the hopes of being moved. This time he is making threats to hurt others.  These threats are not due to psychosis and Lance [name] states that he is aware thart he can go to jail if he actually followed through on these threats.  There is no assault history from Lance [name] despite his repeated visits to the ER for the same presentation.  Patient was admitted on 5/30 and dc'd on 6/07. Per Dr. Stormy Card note on 6/07, this is his fourth admission this year under similar circumstances. His guardian was contacted, and is working with his ACT Team to find alternate placement. While here all of his home medications were continued. His propranolol was decreased to 20 mg at bedtime due to hypotension. At time of discharge he denies suicidal ideations, homicidal ideations, visual hallucinations, and auditory hallucinations. He also specifically denies any desire to  verbally or physically hurt any group home members, or run away from group home.   Per TTS, Patient reports "I need help." When asked what help patient needs he reports "I don't know but I feel like hurting somebody." Patient has similar presentations to this ED in forms of SI and HI and was just admitted to the BMU on 11/11/20 and was discharged on 11/19/20. Patient is still currently engaged with his ACT team with [facility]. Patient reports his need of wanting to move from his group home "they say they are going to move me but don't do it." Psyc team attempted to talk with patient about utilizing his ACT team as a resource when he feels triggered but patient withdrew from the assessment and reported "I don't feel like talking anymore."   Recommendations:  Patient is psych cleared as his homicidal state of mind is not related to psychosis.  If he returns back to group home and continue to make such threats, they should be forwarded to law enforcement. Patient to remain in ED overnight and discharge back to [facility] in the morning pending reassess.     Past Psychiatric History: Schizoaffective disorder  Risk to Self:   Risk to Others:   Prior Inpatient Therapy:   Prior Outpatient Therapy:    Past Medical History:  Past Medical History:  Diagnosis Date   Depression    Diabetes mellitus without complication (HCC)    Hyperlipidemia    Hypertension    Lupus anticoagulant disorder (HCC)  06/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   PE (pulmonary thromboembolism) (HCC)    Schizo affective schizophrenia (HCC)    Supratherapeutic INR 11/19/2016   No past surgical history on file. Family History:  Family History  Problem Relation Age of Onset   CAD Mother    CAD Sister    Family Psychiatric  History: unknown Social History:  Social History   Substance and Sexual Activity  Alcohol Use No   Comment: occassionally     Social History   Substance and Sexual Activity  Drug Use Not Currently    Types: Marijuana   Comment: Last use 11/29/16    Social History   Socioeconomic History   Marital status: Single    Spouse name: Not on file   Number of children: Not on file   Years of education: Not on file   Highest education level: Not on file  Occupational History   Not on file  Tobacco Use   Smoking status: Every Day    Packs/day: 1.00    Years: 23.00    Pack years: 23.00    Types: Cigarettes   Smokeless tobacco: Never   Tobacco comments:    will provide material  Substance and Sexual Activity   Alcohol use: No    Comment: occassionally   Drug use: Not Currently    Types: Marijuana    Comment: Last use 11/29/16   Sexual activity: Never    Birth control/protection: None    Comment: occasional marijuana- none recently  Other Topics Concern   Not on file  Social History Narrative   [place]   Social Determinants of Health   Financial Resource Strain: Not on file  Food Insecurity: Not on file  Transportation Needs: Not on file  Physical Activity: Not on file  Stress: Not on file  Social Connections: Not on file   Additional Social History:    Allergies:   Allergies  Allergen Reactions   Penicillins Other (See Comments)    Reaction: "lockjaw" Has patient had a PCN reaction causing immediate rash, facial/tongue/throat swelling, SOB or lightheadedness with hypotension: No Has patient had a PCN reaction causing severe rash involving mucus membranes or skin necrosis: No Has patient had a PCN reaction that required hospitalization: No Has patient had a PCN reaction occurring within the last 10 years: No If all of the above answers are "NO", then may proceed with Cephalosporin use.     Labs:  Results for orders placed or performed during the hospital encounter of 11/30/20 (from the past 48 hour(s))  Comprehensive metabolic panel     Status: Abnormal   Collection Time: 11/30/20  8:43 PM  Result Value Ref Range   Sodium 140 135 - 145  mmol/L   Potassium 3.7 3.5 - 5.1 mmol/L   Chloride 113 (H) 98 - 111 mmol/L   CO2 19 (L) 22 - 32 mmol/L   Glucose, Bld 165 (H) 70 - 99 mg/dL    Comment: Glucose reference range applies only to samples taken after fasting for at least 8 hours.   BUN 22 (H) 6 - 20 mg/dL   Creatinine, Ser 1.611.11 0.61 - 1.24 mg/dL   Calcium 9.7 8.9 - 09.610.3 mg/dL   Total Protein 7.0 6.5 - 8.1 g/dL   Albumin 4.6 3.5 - 5.0 g/dL   AST 20 15 - 41 U/L   ALT 18 0 - 44 U/L   Alkaline Phosphatase 54 38 - 126  U/L   Total Bilirubin 0.7 0.3 - 1.2 mg/dL   GFR, Estimated >09 >81 mL/min    Comment: (NOTE) Calculated using the CKD-EPI Creatinine Equation (2021)    Anion gap 8 5 - 15    Comment: Performed at [facility], [address] [place], [place] [address]  Ethanol     Status: None   Collection Time: 11/30/20  8:43 PM  Result Value Ref Range   Alcohol, Ethyl (B) <10 <10 mg/dL    Comment: (NOTE) Lowest detectable limit for serum alcohol is 10 mg/dL.  For medical purposes only. Performed at [facility], [address] [place], [place] [address]   Salicylate level     Status: Abnormal   Collection Time: 11/30/20  8:43 PM  Result Value Ref Range   Salicylate Lvl <7.0 (L) 7.0 - 30.0 mg/dL    Comment: Performed at [facility], [address] [place], [place] [address]  Acetaminophen level     Status: Abnormal   Collection Time: 11/30/20  8:43 PM  Result Value Ref Range   Acetaminophen (Tylenol), Serum <10 (L) 10 - 30 ug/mL    Comment: (NOTE) Therapeutic concentrations vary significantly. A range of 10-30 ug/mL  may be an effective concentration for many patients. However, some  are best treated at concentrations outside of this range. Acetaminophen concentrations >150 ug/mL at 4 hours after ingestion  and >50 ug/mL at 12 hours after ingestion are often associated with  toxic reactions.  Performed at [facility], [address] [place], [place] [address]   cbc      Status: None   Collection Time: 11/30/20  8:43 PM  Result Value Ref Range   WBC 4.8 4.0 - 10.5 K/uL   RBC 4.47 4.22 - 5.81 MIL/uL   Hemoglobin 14.4 13.0 - 17.0 g/dL   HCT 69.6 29.5 - 28.4 %   MCV 92.6 80.0 - 100.0 fL   MCH 32.2 26.0 - 34.0 pg   MCHC 34.8 30.0 - 36.0 g/dL   RDW 13.2 44.0 - 10.2 %   Platelets 191 150 - 400 K/uL   nRBC 0.0 0.0 - 0.2 %    Comment: Performed at [facility], [address] [place], [place] [address]  CBG monitoring, ED     Status: Abnormal   Collection Time: 11/30/20  9:38 PM  Result Value Ref Range   Glucose-Capillary 184 (H) 70 - 99 mg/dL    Comment: Glucose reference range applies only to samples taken after fasting for at least 8 hours.    Current Facility-Administered Medications  Medication Dose Route Frequency Provider Last Rate Last Admin   albuterol (PROVENTIL) (2.5 MG/3ML) 0.083% nebulizer solution 2.5 mg  2.5 mg Nebulization Q6H PRN Gilles Chiquito, MD       apixaban Everlene Balls) tablet 5 mg  5 mg Oral BID Gilles Chiquito, MD       cloZAPine (CLOZARIL) tablet 500 mg  500 mg Oral QHS Gilles Chiquito, MD       hydrOXYzine (ATARAX/VISTARIL) tablet 50 mg  50 mg Oral QHS Gilles Chiquito, MD       insulin aspart (novoLOG) injection 0-15 Units  0-15 Units Subcutaneous Q4H Gilles Chiquito, MD       mometasone-formoterol [facility]) 200-5 MCG/ACT inhaler 2 puff  2 puff Inhalation BID Gilles Chiquito, MD       OLANZapine (ZYPREXA) tablet 20 mg  20 mg Oral QHS Gilles Chiquito, MD  propranolol (INDERAL) tablet 20 mg  20 mg Oral QHS Gilles Chiquito, MD       sertraline (ZOLOFT) tablet 50 mg  50 mg Oral Daily Gilles Chiquito, MD       [START ON 12/01/2020] simvastatin (ZOCOR) tablet 40 mg  40 mg Oral q1800 Gilles Chiquito, MD       traZODone (DESYREL) tablet 100 mg  100 mg Oral QHS Gilles Chiquito, MD       Current Outpatient Medications  Medication Sig Dispense Refill   ADVAIR DISKUS 250-50 MCG/ACT AEPB Inhale 1 puff into the  lungs 2 (two) times daily.     albuterol (VENTOLIN HFA) 108 (90 Base) MCG/ACT inhaler Inhale 1-2 puffs into the lungs every 6 (six) hours as needed for wheezing or shortness of breath. 18 g 1   apixaban (ELIQUIS) 5 MG TABS tablet Take 1 tablet (5 mg total) by mouth 2 (two) times daily. 60 tablet 6   cloZAPine (CLOZARIL) 100 MG tablet Take 5 tablets (500 mg total) by mouth at bedtime. 150 tablet 1   glucose blood (FORA V30A BLOOD GLUCOSE TEST) test strip Use as instructed 100 each 12   hydrOXYzine (ATARAX/VISTARIL) 50 MG tablet Take 1 tablet (50 mg total) by mouth at bedtime. 30 tablet 1   LEVEMIR FLEXTOUCH 100 UNIT/ML FlexPen Inject 10 Units into the skin 2 (two) times daily.     lithium carbonate (ESKALITH) 450 MG CR tablet Take 1 tablet (450 mg total) by mouth at bedtime. 30 tablet 1   lithium carbonate (LITHOBID) 300 MG CR tablet Take 1 tablet (300 mg total) by mouth in the morning. 30 tablet 1   metFORMIN (GLUCOPHAGE) 1000 MG tablet Take 1 tablet (1,000 mg total) by mouth 2 (two) times daily with a meal. 60 tablet 1   NOVOFINE AUTOCOVER PEN NEEDLE 30G X 8 MM MISC      OLANZapine (ZYPREXA) 20 MG tablet Take 1 tablet (20 mg total) by mouth at bedtime. 30 tablet 1   Omega-3 Fatty Acids (FISH OIL) 1000 MG CAPS Take 1 capsule by mouth daily.     propranolol (INDERAL) 20 MG tablet Take 1 tablet (20 mg total) by mouth at bedtime. 30 tablet 1   sertraline (ZOLOFT) 50 MG tablet Take 1 tablet (50 mg total) by mouth daily. 30 tablet 1   simvastatin (ZOCOR) 40 MG tablet Take 1 tablet (40 mg total) by mouth daily at 6 PM. 30 tablet 6   traZODone (DESYREL) 100 MG tablet Take 1 tablet (100 mg total) by mouth at bedtime. 30 tablet 1    Musculoskeletal: Strength & Muscle Tone: within normal limits Gait & Station: normal Patient leans: N/A  Psychiatric Specialty Exam:  Presentation  General Appearance: Bizarre  Eye Contact:Fair  Speech:Clear and Coherent  Speech  Volume:Normal  Handedness:Right   Mood and Affect  Mood:Angry; Irritable  Affect:Labile   Thought Process  Thought Processes:Coherent  Descriptions of Associations:Intact  Orientation:Full (Time, Place and Person)  Thought Content:WDL  History of Schizophrenia/Schizoaffective disorder:Yes  Duration of Psychotic Symptoms:Less than six months  Hallucinations:Hallucinations: None  Ideas of Reference:None  Suicidal Thoughts:Suicidal Thoughts: No  Homicidal Thoughts:Homicidal Thoughts: Yes, Passive   Sensorium  Memory:Immediate Poor  Judgment:Poor  Insight:Poor   Executive Functions  Concentration:Poor  Attention Span:Poor  Recall:Poor  Fund of Knowledge:Poor  Language:Poor   Psychomotor Activity  Psychomotor Activity:Psychomotor Activity: Normal   Assets  Assets:Desire for Improvement   Sleep  Sleep:Sleep: Fair   Physical Exam: Physical  Exam Vitals and nursing note reviewed.  Constitutional:      Appearance: Normal appearance.  HENT:     Head: Normocephalic and atraumatic.     Nose: Nose normal.     Mouth/Throat:     Mouth: Mucous membranes are dry.  Eyes:     Pupils: Pupils are equal, round, and reactive to light.  Pulmonary:     Effort: Pulmonary effort is normal.  Musculoskeletal:        General: Normal range of motion.     Cervical back: Normal range of motion.  Skin:    General: Skin is warm and dry.  Neurological:     General: No focal deficit present.     Mental Status: He is alert and oriented to person, place, and time.  Psychiatric:        Attention and Perception: Attention and perception normal.        Mood and Affect: Mood is anxious.        Speech: Speech normal.        Behavior: Behavior normal. Behavior is cooperative.        Thought Content: Thought content normal.        Cognition and Memory: Cognition and memory normal.        Judgment: Judgment is impulsive.   Review of Systems  All other systems reviewed  and are negative. Blood pressure 109/89, pulse 99, resp. rate 18, height 5\' 10"  (1.778 m), weight 88.5 kg, SpO2 98 %. Body mass index is 27.98 kg/m.  Treatment Plan Summary: Daily contact with patient to assess and evaluate symptoms and progress in treatment and Medication management  Disposition: No evidence of imminent risk to self or others at present.   Patient does not meet criteria for psychiatric inpatient admission. Discussed crisis plan, support from social network, calling 911, coming to the Emergency Department, and calling Suicide Hotline.  , NP 11/30/2020 10:39 PM

## 2020-11-30 NOTE — ED Provider Notes (Signed)
[facility] Emergency Department Provider Note  ____________________________________________   Event Date/Time   First MD Initiated Contact with Patient 11/30/20 2103     (approximate)  I have reviewed the triage vital signs and the nursing notes.   HISTORY  Chief Complaint Suicidal and Homicidal   HPI Lance [name] is a 45 y.o. male with Paschal history of DM, HTN, HDL, depression, lupus anticoagulant on Eliquis as he has had PEs in the past and schizoaffective disorder who presents from his group home stating he is feeling suicidal and concerned that he might harm other people in his home.  He states he feels that people his group home do not like him and he feels her conspiracy out to get him.  States he has not been sleeping well.  He denies any acute hallucinations but endorses some chronic auditory hallucinations although refuses to elaborate on what he hears.  He denies any acute physical complaints including headache, earache, sore throat, nausea, vomiting, diarrhea, dysuria, rash or any recent falls or injuries.  He denies any illicit drug use.  He states he takes his medicines as directed.  He cannot identify any specific stressors today that made his symptoms worse.         Past Medical History:  Diagnosis Date   Depression    Diabetes mellitus without complication (HCC)    Hyperlipidemia    Hypertension    Lupus anticoagulant disorder (HCC) 06/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   PE (pulmonary thromboembolism) (HCC)    Schizo affective schizophrenia (HCC)    Supratherapeutic INR 11/19/2016    Patient Active Problem List   Diagnosis Date Noted   Alcohol abuse 04/12/2018   Tobacco use disorder 09/01/2016   Schizoaffective disorder, bipolar type (HCC) 05/22/2016   Diabetes mellitus without complication (HCC) 05/21/2016   Hyperlipidemia 05/21/2016   History of pulmonary embolism 05/21/2016   Chronic anticoagulation 05/21/2016    Hypertension 09/25/2015    No past surgical history on file.  Prior to Admission medications   Medication Sig Start Date End Date Taking? Authorizing Provider  ADVAIR DISKUS 250-50 MCG/ACT AEPB Inhale 1 puff into the lungs 2 (two) times daily. 10/24/20   [provider]  albuterol (VENTOLIN HFA) 108 (90 Base) MCG/ACT inhaler Inhale 1-2 puffs into the lungs every 6 (six) hours as needed for wheezing or shortness of breath. 11/14/19   Clapacs, [name], MD  apixaban (ELIQUIS) 5 MG TABS tablet Take 1 tablet (5 mg total) by mouth 2 (two) times daily. 04/29/20   Corky Downs, MD  cloZAPine (CLOZARIL) 100 MG tablet Take 5 tablets (500 mg total) by mouth at bedtime. 11/18/20   [name], MD  glucose blood (FORA V30A BLOOD GLUCOSE TEST) test strip Use as instructed 01/25/20   Corky Downs, MD  hydrOXYzine (ATARAX/VISTARIL) 50 MG tablet Take 1 tablet (50 mg total) by mouth at bedtime. 11/18/20   [name], MD  LEVEMIR FLEXTOUCH 100 UNIT/ML FlexPen Inject 10 Units into the skin 2 (two) times daily. 01/24/20   [provider]  lithium carbonate (ESKALITH) 450 MG CR tablet Take 1 tablet (450 mg total) by mouth at bedtime. 11/18/20   [name], MD  lithium carbonate (LITHOBID) 300 MG CR tablet Take 1 tablet (300 mg total) by mouth in the morning. 11/19/20   [name], MD  metFORMIN (GLUCOPHAGE) 1000 MG tablet Take 1 tablet (1,000 mg total) by mouth 2 (two) times  daily with a meal. 11/14/19   Clapacs, [name], MD  NOVOFINE AUTOCOVER PEN NEEDLE 30G X 8 MM MISC  09/11/20   [provider]  OLANZapine (ZYPREXA) 20 MG tablet Take 1 tablet (20 mg total) by mouth at bedtime. 11/18/20   [name], MD  Omega-3 Fatty Acids (FISH OIL) 1000 MG CAPS Take 1 capsule by mouth daily. 08/27/20   [provider]  propranolol (INDERAL) 20 MG tablet Take 1 tablet (20 mg total) by mouth at bedtime. 11/18/20   [name], MD  sertraline (ZOLOFT) 50 MG tablet Take 1 tablet  (50 mg total) by mouth daily. 11/18/20   [name], MD  simvastatin (ZOCOR) 40 MG tablet Take 1 tablet (40 mg total) by mouth daily at 6 PM. 11/23/19   Corky Downs, MD  traZODone (DESYREL) 100 MG tablet Take 1 tablet (100 mg total) by mouth at bedtime. 11/18/20   [name], MD    Allergies Penicillins  Family History  Problem Relation Age of Onset   CAD Mother    CAD Sister     Social History Social History   Tobacco Use   Smoking status: Every Day    Packs/day: 1.00    Years: 23.00    Pack years: 23.00    Types: Cigarettes   Smokeless tobacco: Never   Tobacco comments:    will provide material  Substance Use Topics   Alcohol use: No    Comment: occassionally   Drug use: Not Currently    Types: Marijuana    Comment: Last use 11/29/16    Review of Systems  Review of Systems  Constitutional:  Negative for chills and fever.  HENT:  Negative for sore throat.   Eyes:  Negative for pain.  Respiratory:  Negative for cough and stridor.   Cardiovascular:  Negative for chest pain.  Gastrointestinal:  Negative for vomiting.  Genitourinary:  Negative for dysuria.  Musculoskeletal:  Negative for myalgias.  Skin:  Negative for rash.  Neurological:  Negative for seizures, loss of consciousness and headaches.  Psychiatric/Behavioral:  Positive for depression, hallucinations and suicidal ideas. The patient has insomnia.   All other systems reviewed and are negative.    ____________________________________________   PHYSICAL EXAM:  VITAL SIGNS: ED Triage Vitals [11/30/20 2034]  Enc Vitals Group     BP 109/89     Pulse Rate 99     Resp 18     Temp      Temp Source Oral     SpO2 98 %     Weight 195 lb (88.5 kg)     Height 5\' 10"  (1.778 m)     Head Circumference      Peak Flow      Pain Score 0     Pain Loc      Pain Edu?      Excl. in GC?    Vitals:   11/30/20 2034  BP: 109/89  Pulse: 99  Resp: 18  SpO2: 98%   Physical Exam Vitals and nursing  note reviewed.  Constitutional:      Appearance: He is well-developed.  HENT:     Head: Normocephalic and atraumatic.     Right Ear: External ear normal.     Left Ear: External ear normal.     Nose: Nose normal.  Eyes:     Conjunctiva/sclera: Conjunctivae normal.  Cardiovascular:     Rate and Rhythm: Normal rate and regular rhythm.  Heart sounds: No murmur heard. Pulmonary:     Effort: Pulmonary effort is normal. No respiratory distress.     Breath sounds: Normal breath sounds.  Abdominal:     Palpations: Abdomen is soft.     Tenderness: There is no abdominal tenderness.  Musculoskeletal:     Cervical back: Neck supple.  Skin:    General: Skin is warm and dry.     Capillary Refill: Capillary refill takes less than 2 seconds.  Neurological:     Mental Status: He is alert.  Psychiatric:        Mood and Affect: Mood is depressed.        Thought Content: Thought content includes homicidal and suicidal ideation. Thought content does not include homicidal or suicidal plan.     ____________________________________________   LABS (all labs ordered are listed, but only abnormal results are displayed)  Labs Reviewed  COMPREHENSIVE METABOLIC PANEL - Abnormal; Notable for the following components:      Result Value   Chloride 113 (*)    CO2 19 (*)    Glucose, Bld 165 (*)    BUN 22 (*)    All other components within normal limits  SALICYLATE LEVEL - Abnormal; Notable for the following components:   Salicylate Lvl <7.0 (*)    All other components within normal limits  ACETAMINOPHEN LEVEL - Abnormal; Notable for the following components:   Acetaminophen (Tylenol), Serum <10 (*)    All other components within normal limits  RESP PANEL BY RT-PCR (FLU A&B, COVID) ARPGX2  ETHANOL  CBC  URINE DRUG SCREEN, QUALITATIVE ([facility] ONLY)  LITHIUM LEVEL   ____________________________________________  EKG  ____________________________________________  RADIOLOGY  ED MD  interpretation:    Official radiology report(s): No results found.  ____________________________________________   PROCEDURES  Procedure(s) performed (including Critical Care):  Procedures   ____________________________________________   INITIAL IMPRESSION / ASSESSMENT AND PLAN / ED COURSE      Patient presents with above-stated history and exam for assessment of some acute on chronic suicidality and homicidal thoughts.  He has been evaluated for the symptoms several times in the ED most recently discharged on 6/7.  States he has been compliant with his medications.  He is afebrile and hemodynamically stable on arrival.  He denies any other acute concerns.  Evalose patient for significant underlying organic etiology contributing presentation today although we will send routine psych screening labs.  TTS and psychiatry consulted.  We will keep patient voluntarily at this time patient states he does not wish to leave and wants to get help with how he is feeling.  The patient has been placed in psychiatric observation due to the need to provide a safe environment for the patient while obtaining psychiatric consultation and evaluation, as well as ongoing medical and medication management to treat the patient's condition.  The patient has not been placed under full IVC at this time.   ____________________________________________   FINAL CLINICAL IMPRESSION(S) / ED DIAGNOSES  Final diagnoses:  Suicidal ideation  Homicidal thoughts    Medications  mometasone-formoterol (DULERA) 200-5 MCG/ACT inhaler 2 puff (has no administration in time range)  albuterol (PROVENTIL) (2.5 MG/3ML) 0.083% nebulizer solution 2.5 mg (has no administration in time range)  apixaban (ELIQUIS) tablet 5 mg (has no administration in time range)  cloZAPine (CLOZARIL) tablet 500 mg (has no administration in time range)  hydrOXYzine (ATARAX/VISTARIL) tablet 50 mg (has no administration in time range)  insulin  aspart (novoLOG) injection 0-15 Units (has no administration  in time range)  simvastatin (ZOCOR) tablet 40 mg (has no administration in time range)  sertraline (ZOLOFT) tablet 50 mg (has no administration in time range)  propranolol (INDERAL) tablet 20 mg (has no administration in time range)  OLANZapine (ZYPREXA) tablet 20 mg (has no administration in time range)  traZODone (DESYREL) tablet 100 mg (has no administration in time range)     ED Discharge Orders     None        Note:  This document was prepared using Dragon voice recognition software and may include unintentional dictation errors.    Gilles ChiquitoSmith, Charisa Twitty P, MD 11/30/20 2133

## 2020-11-30 NOTE — ED Notes (Signed)
Hourly rounding reveals patient in room. No complaints, stable, in no acute distress. Q15 minute rounds and monitoring via Security Cameras to continue. 

## 2020-11-30 NOTE — ED Notes (Signed)
Report given to [facility]

## 2020-11-30 NOTE — ED Notes (Signed)
Pt. Transferred from Triage to room [facility] after dressing out and screening for contraband. Report to include Situation, Background, Assessment and Recommendations from triage RN. Pt. Oriented to Quad including Q15 minute rounds as well as Psychologist, counselling for their protection. Patient is alert and oriented, warm and dry in no acute distress. Patient reported SI and  HI without a plan. Contracted for safety. Denied AVH. Pt. Encouraged to let me know if needs arise.

## 2020-11-30 NOTE — ED Triage Notes (Signed)
Pt states he has "wolf" in him. Pt states today he feels alienated in the group home. Pt states "it's a conspriacy against me". Pt states he is suicidal and also wants to hurt others at the group home. Pt [facility].

## 2020-11-30 NOTE — BH Assessment (Signed)
Comprehensive Clinical Assessment (CCA) Note  11/30/2020 Lance [name] [id number]  Chief Complaint: Patient is a 45 year old male presenting to [facility] ED voluntarily due to having SI and HI. Per triage note Pt states he has "wolf" in him. Pt states today he feels alienated in the group home. Pt states "it's a conspriacy against me". Pt states he is suicidal and also wants to hurt others at the group home. Pt states [facility].During assessment patient appeared alert and oriented x4, irritable and tense. Patient reports "I need help." When asked what help patient needs he reports "I don't know but I feel like hurting somebody." Patient has similar presentations to this ED in forms of SI and HI and was just admitted to the BMU on 11/11/20 and was discharged on 11/19/20. Patient is still currently engaged with his ACT team with [facility]. Patient reports his need of wanting to move from his group home "they say they are going to move me but don't do it." Psyc team attempted to talk with patient about utilizing his ACT team as a resource when he feels triggered but patient withdrew from the assessment and reported "I don't feel like talking anymore."   Per Psyc NP Rashaun Dixon patient to remain in ED overnight and discharge back to [facility] in the morning pending reassess Chief Complaint  Patient presents with   Suicidal   Homicidal   Visit Diagnosis: Schizoaffective Disorder, bipolar type   CCA Screening, Triage and Referral (STR)  Patient Reported Information How did you hear about [place]? Self  Referral name: Lance [name]  Referral phone number: No data recorded  Whom do you see for routine medical problems? Primary Care  Practice/Facility Name: [facility]  Practice/Facility Phone Number: No data recorded Name of Contact: Dr. Sheppard Plumber Number: No data recorded Contact Fax Number: No data recorded Prescriber Name: No data recorded Prescriber Address (if  known): No data recorded  What Is the Reason for Your Visit/Call Today? Patient presents voluntarily due to having SI and HI  How Long Has This Been Causing You Problems? > than 6 months  What Do You Feel Would Help You the Most Today? Treatment for Depression or other mood problem; Housing Assistance   Have You Recently Been in Any Inpatient Treatment (Hospital/Detox/Crisis Center/28-Day Program)? Yes  Name/Location of Program/Hospital:[facility]  How Long Were You There? few days  When Were You Discharged? No data recorded  Have You Ever Received Services From [facility] Before? Yes  Who Do You See at [facility]? Psychiatric   Have You Recently Had Any Thoughts About Hurting Yourself? Yes  Are You Planning to Commit Suicide/Harm Yourself At This time? No   Have you Recently Had Thoughts About Hurting Someone Lance [name]? Yes  Explanation: Patient did not disclose a particular plan   Have You Used Any Alcohol or Drugs in the Past 24 Hours? No  How Long Ago Did You Use Drugs or Alcohol? No data recorded What Did You Use and How Much? No data recorded  Do You Currently Have a Therapist/Psychiatrist? Yes  Name of Therapist/Psychiatrist: [name]   Have You Been Recently Discharged From Any Office Practice or Programs? No  Explanation of Discharge From Practice/Program: No data recorded    CCA Screening Triage Referral Assessment Type of Contact: Face-to-Face  Is this Initial or Reassessment? No data recorded Date Telepsych consult ordered in [facility]:  No data recorded Time Telepsych  consult ordered in [facility]:  No data recorded  Patient Reported Information Reviewed? Yes  Patient Left Without Being Seen? No data recorded Reason for Not Completing Assessment: No data recorded  Collateral Involvement: None   Does Patient Have a Court Appointed Legal Guardian? No data recorded Name and Contact of Legal Guardian: Lance [name] [phone]  If Minor and Not Living  with Parent(s), Who has Custody? n/a  Is CPS involved or ever been involved? Never  Is APS involved or ever been involved? Never   Patient Determined To Be At Risk for Harm To Self or Others Based on Review of Patient Reported Information or Presenting Complaint? Yes, for Harm to Others  Method: No Plan  Availability of Means: No access or NA  Intent: Intends to cause physical harm but not necessarily death  Notification Required: -- (Patient recommended for inpatient)  Additional Information for Danger to Others Potential: Active psychosis  Additional Comments for Danger to Others Potential: No data recorded Are There Guns or Other Weapons in Your Home? No  Types of Guns/Weapons: No data recorded Are These Weapons Safely Secured?                            No data recorded Who Could Verify You Are Able To Have These Secured: No data recorded Do You Have any Outstanding Charges, Pending Court Dates, Parole/Probation? No data recorded Contacted To Inform of Risk of Harm To Self or Others: No data recorded  Location of Assessment: [facility] ED   Does Patient Present under Involuntary Commitment? No  IVC Papers Initial File Date: 09/19/20   [place] of Residence: [place]   Patient Currently Receiving the Following Services: Group Home; ACTT Engineer, agricultural Treatment); Medication Management   Determination of Need: Emergent (2 hours)   Options For Referral: Inpatient Hospitalization     CCA Biopsychosocial Intake/Chief Complaint:  Homicidal ideation  Current Symptoms/Problems: Patient presents to ED under IVC with SI and plan to cut his wrists   Patient Reported Schizophrenia/Schizoaffective Diagnosis in Past: Yes   Strengths: Patient is able to communicate  Preferences: Unknown  Abilities: Patient is able to communicate his needs   Type of Services Patient Feels are Needed: Inpatient hospitalization   Initial Clinical Notes/Concerns: None   Mental  Health Symptoms Depression:   Difficulty Concentrating; Irritability   Duration of Depressive symptoms:  Greater than two weeks   Mania:   Irritability   Anxiety:    Irritability; Restlessness; Worrying; Tension   Psychosis:   None   Duration of Psychotic symptoms:  Greater than six months   Trauma:   None   Obsessions:   None   Compulsions:   None   Inattention:   None   Hyperactivity/Impulsivity:   None   Oppositional/Defiant Behaviors:   None   Emotional Irregularity:   None   Other Mood/Personality Symptoms:  No data recorded   Mental Status Exam Appearance and self-care  Stature:   Average   Weight:   Average weight   Clothing:   Casual   Grooming:   Normal   Cosmetic use:   None   Posture/gait:   Normal   Motor activity:   Agitated   Sensorium  Attention:   Normal   Concentration:   Normal   Orientation:   X5   Recall/memory:   Normal   Affect and Mood  Affect:   Appropriate   Mood:   Irritable   Relating  Eye contact:   Avoided   Facial expression:   Responsive; Tense   Attitude toward examiner:   Defensive; Irritable; Resistant   Thought and Language  Speech flow:  Clear and Coherent   Thought content:   Appropriate to Mood and Circumstances   Preoccupation:   None   Hallucinations:   None   Organization:  No data recorded  [employer] of Knowledge:   Fair   Intelligence:   Average   Abstraction:   Normal   Judgement:   Fair   Programmer, systems   Insight:   Fair   Decision Making:   Normal   Social Functioning  Social Maturity:   Isolates   Social Judgement:   Normal   Stress  Stressors:   Housing   Coping Ability:   Exhausted   Skill Deficits:   None   Supports:   Friends/Service system     Religion: Religion/Spirituality Are You A Religious Person?: No  Leisure/Recreation: Leisure / Recreation Do You Have Hobbies?:  No  Exercise/Diet: Exercise/Diet Do You Exercise?: No Have You Gained or Lost A Significant Amount of Weight in the Past Six Months?: No Do You Follow a Special Diet?: No Do You Have Any Trouble Sleeping?: No   CCA Employment/Education Employment/Work Situation: Employment / Work Systems developer: On disability Why is Patient on Disability: Mental Health How Long has Patient Been on Disability: Unknown Patient's Job has Been Impacted by Current Illness: No Has Patient ever Been in the [employer]?: No  Education: Education Did You Have An Individualized Education Program (IIEP): No Did You Have Any Difficulty At [employer]?: No Patient's Education Has Been Impacted by Current Illness: No   CCA Family/Childhood History Family and Relationship History: Family history Marital status: Single Does patient have children?: No  Childhood History:  Childhood History By whom was/is the patient raised?: Both parents Did patient suffer any verbal/emotional/physical/sexual abuse as a child?: No Did patient suffer from severe childhood neglect?: No Has patient ever been sexually abused/assaulted/raped as an adolescent or adult?: No Was the patient ever a victim of a crime or a disaster?: No Witnessed domestic violence?: No Has patient been affected by domestic violence as an adult?: No  Child/Adolescent Assessment:     CCA Substance Use Alcohol/Drug Use: Alcohol / Drug Use Pain Medications: See MAR Prescriptions: See MAR Over the Counter: See MAR History of alcohol / drug use?: No history of alcohol / drug abuse                         ASAM's:  Six Dimensions of Multidimensional Assessment  Dimension 1:  Acute Intoxication and/or Withdrawal Potential:      Dimension 2:  Biomedical Conditions and Complications:      Dimension 3:  Emotional, Behavioral, or Cognitive Conditions and Complications:     Dimension 4:  Readiness to Change:     Dimension 5:   Relapse, Continued use, or Continued Problem Potential:     Dimension 6:  Recovery/Living Environment:     ASAM Severity Score:    ASAM Recommended Level of Treatment:     Substance use Disorder (SUD)    Recommendations for Services/Supports/Treatments:  Per Psyc NP Rashaun Dixon patient to remain in ED overnight and discharge back to [facility] in the morning pending reassess  DSM5 Diagnoses: Patient Active Problem List   Diagnosis Date Noted   Alcohol abuse 04/12/2018   Tobacco use disorder  09/01/2016   Schizoaffective disorder, bipolar type (HCC) 05/22/2016   Diabetes mellitus without complication (HCC) 05/21/2016   Hyperlipidemia 05/21/2016   History of pulmonary embolism 05/21/2016   Chronic anticoagulation 05/21/2016   Hypertension 09/25/2015    Patient Centered Plan: Patient is on the following Treatment Plan(s):  Impulse Control   Referrals to Alternative Service(s): Referred to Alternative Service(s):   Place:   Date:   Time:    Referred to Alternative Service(s):   Place:   Date:   Time:    Referred to Alternative Service(s):   Place:   Date:   Time:    Referred to Alternative Service(s):   Place:   Date:   Time:     Romell Cavanah A Tannar Broker, [name]-A

## 2020-12-01 LAB — CBC WITH DIFFERENTIAL/PLATELET
Abs Immature Granulocytes: 0.03 10*3/uL (ref 0.00–0.07)
Basophils Absolute: 0 10*3/uL (ref 0.0–0.1)
Basophils Relative: 0 %
Eosinophils Absolute: 0 10*3/uL (ref 0.0–0.5)
Eosinophils Relative: 0 %
HCT: 41.9 % (ref 39.0–52.0)
Hemoglobin: 14.4 g/dL (ref 13.0–17.0)
Immature Granulocytes: 1 %
Lymphocytes Relative: 27 %
Lymphs Abs: 1.3 10*3/uL (ref 0.7–4.0)
MCH: 32.3 pg (ref 26.0–34.0)
MCHC: 34.4 g/dL (ref 30.0–36.0)
MCV: 93.9 fL (ref 80.0–100.0)
Monocytes Absolute: 0.6 10*3/uL (ref 0.1–1.0)
Monocytes Relative: 12 %
Neutro Abs: 2.8 10*3/uL (ref 1.7–7.7)
Neutrophils Relative %: 60 %
Platelets: 205 10*3/uL (ref 150–400)
RBC: 4.46 MIL/uL (ref 4.22–5.81)
RDW: 13.1 % (ref 11.5–15.5)
WBC: 4.7 10*3/uL (ref 4.0–10.5)
nRBC: 0 % (ref 0.0–0.2)

## 2020-12-01 LAB — URINE DRUG SCREEN, QUALITATIVE (ARMC ONLY)
Amphetamines, Ur Screen: NOT DETECTED
Barbiturates, Ur Screen: NOT DETECTED
Benzodiazepine, Ur Scrn: NOT DETECTED
Cannabinoid 50 Ng, Ur ~~LOC~~: NOT DETECTED
Cocaine Metabolite,Ur ~~LOC~~: NOT DETECTED
MDMA (Ecstasy)Ur Screen: NOT DETECTED
Methadone Scn, Ur: NOT DETECTED
Opiate, Ur Screen: NOT DETECTED
Phencyclidine (PCP) Ur S: NOT DETECTED
Tricyclic, Ur Screen: POSITIVE — AB

## 2020-12-01 LAB — CBG MONITORING, ED
Glucose-Capillary: 121 mg/dL — ABNORMAL HIGH (ref 70–99)
Glucose-Capillary: 113 mg/dL — ABNORMAL HIGH (ref 70–99)
Glucose-Capillary: 133 mg/dL — ABNORMAL HIGH (ref 70–99)
Glucose-Capillary: 103 mg/dL — ABNORMAL HIGH (ref 70–99)

## 2020-12-01 NOTE — Discharge Instructions (Signed)
Follow up with out patient provider

## 2020-12-01 NOTE — ED Notes (Signed)
Pt given shower supplies and noted to be in shower 

## 2020-12-01 NOTE — ED Notes (Signed)
Pt given belongings bag 2/2 back. Group home out front to take pt home.

## 2020-12-01 NOTE — ED Notes (Signed)
Pt to nurses desk, states he is having thoughts of self harm. This nurse sits with pt in day room for several minutes talking to pt. Pt reports that these are coming from events in life, states that he comes here and is discharged without help. After reassuring pt of plan of care, pt returns to room.

## 2020-12-01 NOTE — ED Notes (Signed)
Pt asleep at this time, unable to collect vitals. Will collect pt vitals once awake. 

## 2020-12-01 NOTE — ED Notes (Signed)
Lab called and states able to add on diff to previous CBC

## 2020-12-01 NOTE — ED Notes (Signed)
Hourly rounding performed, patient currently awake in room. Patient has no complaints at this time. Q15 minute rounds and monitoring via Security Cameras to continue. 

## 2020-12-01 NOTE — Progress Notes (Signed)
Pharmacy - Clozapine     This patient's order has been reviewed for prescribing contraindications.    clozapine dose: 500 mg po at bedtime   Labs:  neutrophils: __2.8___ K/uL   The medication is being dispensed pursuant to the FDA REMS order   The above ANC was recorded via [email or web address]    Plan: continue to monitor ANC once weekly: next 12/07/2020

## 2020-12-01 NOTE — ED Notes (Signed)
Hourly rounding performed, patient currently awake in dayroom. Patient has no complaints at this time. Q15 minute rounds and monitoring via Security Cameras to continue. 

## 2020-12-01 NOTE — ED Notes (Signed)
Hourly rounding performed, patient currently asleep in room. Patient has no complaints at this time. Q15 minute rounds and monitoring via Security Cameras to continue. 

## 2020-12-01 NOTE — BH Assessment (Signed)
Writer left message on Group Home Administrator, Nancy's voicemail to return call.

## 2020-12-01 NOTE — ED Provider Notes (Signed)
Emergency Medicine Observation Re-evaluation Note  Lance [name] is a 45 y.o. male, seen on rounds today.  Pt initially presented to the ED for complaints of Suicidal and Homicidal Currently, the patient is resting comfortably.  Physical Exam  BP 109/89   Pulse 99   Resp 18   Ht 5\' 10"  (1.778 m)   Wt 88.5 kg   SpO2 98%   BMI 27.98 kg/m  Physical Exam Constitutional:      Appearance: He is not ill-appearing or toxic-appearing.  HENT:     Head: Atraumatic.  Cardiovascular:     Comments: Well perfused Pulmonary:     Effort: Pulmonary effort is normal.  Abdominal:     General: There is no distension.  Musculoskeletal:        General: No deformity.  Skin:    Findings: No rash.  Neurological:     General: No focal deficit present.     Cranial Nerves: No cranial nerve deficit.     ED Course / MDM  EKG:   I have reviewed the labs performed to date as well as medications administered while in observation.  Recent changes in the last 24 hours include moved back to the [facility].  Plan  Current plan is for AM reassessment by our psychiatric team. Patient is not under full IVC at this time.   , MD 12/01/20 [phone]

## 2020-12-01 NOTE — ED Notes (Signed)
VOL/pending reassesment in AM for discharge

## 2020-12-01 NOTE — BH Assessment (Signed)
Writer contacted group home [phone]. Rep said to call Lance [name] Clinical research associate) [phone]. Writer spoke with Lance [name] and she stated she will inform the owner and the owner will come pick up the patient.

## 2020-12-01 NOTE — ED Notes (Signed)
Attempted to get group home to come get pt-no answer. HIPPA voicemail left.

## 2020-12-01 NOTE — ED Notes (Signed)
Legal guardian called this RN and is aware that he is here.

## 2020-12-19 ENCOUNTER — Other Ambulatory Visit: Payer: Self-pay | Admitting: Psychiatry

## 2020-12-24 ENCOUNTER — Other Ambulatory Visit: Payer: Self-pay | Admitting: Internal Medicine

## 2020-12-26 ENCOUNTER — Other Ambulatory Visit: Payer: Self-pay | Admitting: Internal Medicine

## 2021-01-06 ENCOUNTER — Ambulatory Visit: Payer: Medicare Other | Admitting: Podiatry

## 2021-01-07 ENCOUNTER — Other Ambulatory Visit: Payer: Self-pay | Admitting: Internal Medicine

## 2021-01-10 ENCOUNTER — Ambulatory Visit: Payer: Medicare Other | Admitting: *Deleted

## 2021-01-10 ENCOUNTER — Other Ambulatory Visit: Payer: Self-pay

## 2021-01-10 NOTE — Progress Notes (Deleted)
Subjective:   Lance [name] is a 45 y.o. male who presents for Medicare Annual/Subsequent preventive examination.  Review of Systems    ***       Objective:    There were no vitals filed for this visit. There is no height or weight on file to calculate BMI.  Advanced Directives 11/09/2020 09/19/2020 01/04/2020 11/10/2019 03/17/2019 01/14/2018 08/04/2017  Does Patient Have a Medical Advance Directive? No No No No No No No  Would patient like information on creating a medical advance directive? - - - No - Patient declined - - No - Patient declined  Some encounter information is confidential and restricted. Go to Review Flowsheets activity to see all data.    Current Medications (verified) Outpatient Encounter Medications as of 01/10/2021  Medication Sig   ADVAIR DISKUS 250-50 MCG/ACT AEPB Inhale 1 puff into the lungs 2 (two) times daily.   albuterol (VENTOLIN HFA) 108 (90 Base) MCG/ACT inhaler Inhale 1-2 puffs into the lungs every 6 (six) hours as needed for wheezing or shortness of breath.   cloZAPine (CLOZARIL) 100 MG tablet Take 5 tablets (500 mg total) by mouth at bedtime.   ELIQUIS 5 MG TABS tablet TAKE 1 TABLET BY MOUTH TWICE DAILY.   glucose blood (FORA V30A BLOOD GLUCOSE TEST) test strip Use as instructed   hydrOXYzine (ATARAX/VISTARIL) 50 MG tablet Take 1 tablet (50 mg total) by mouth at bedtime.   LEVEMIR FLEXTOUCH 100 UNIT/ML FlexPen Inject 10 Units into the skin 2 (two) times daily.   lisinopril (ZESTRIL) 2.5 MG tablet TAKE 1 TABLET EVERY MORNING FOR BLOOD PRESSURE.   lithium carbonate (ESKALITH) 450 MG CR tablet Take 1 tablet (450 mg total) by mouth at bedtime.   lithium carbonate (LITHOBID) 300 MG CR tablet Take 1 tablet (300 mg total) by mouth in the morning.   metFORMIN (GLUCOPHAGE) 1000 MG tablet TAKE 1 TABLET BY MOUTH TWICE DAILY.   NOVOFINE AUTOCOVER PEN NEEDLE 30G X 8 MM MISC USE WITH LEVEMIR TWICE DAILY.   OLANZapine (ZYPREXA) 20 MG tablet Take 1 tablet (20 mg total)  by mouth at bedtime.   Omega-3 Fatty Acids (FISH OIL) 1000 MG CAPS TAKE 1 CAPSULE BY MOUTH ONCE A DAY.   propranolol (INDERAL) 20 MG tablet Take 1 tablet (20 mg total) by mouth at bedtime.   sertraline (ZOLOFT) 50 MG tablet Take 1 tablet (50 mg total) by mouth daily.   simvastatin (ZOCOR) 40 MG tablet Take 1 tablet (40 mg total) by mouth daily at 6 PM.   traZODone (DESYREL) 100 MG tablet Take 1 tablet (100 mg total) by mouth at bedtime.   No facility-administered encounter medications on file as of 01/10/2021.    Allergies (verified) Penicillins   History: Past Medical History:  Diagnosis Date   Depression    Diabetes mellitus without complication (HCC)    Hyperlipidemia    Hypertension    Lupus anticoagulant disorder (HCC) 06/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   PE (pulmonary thromboembolism) (HCC)    Schizo affective schizophrenia (HCC)    Supratherapeutic INR 11/19/2016   No past surgical history on file. Family History  Problem Relation Age of Onset   CAD Mother    CAD Sister    Social History   Socioeconomic History   Marital status: Single    Spouse name: Not on file   Number of children: Not on file   Years of education: Not on file   Highest education level: Not  on file  Occupational History   Not on file  Tobacco Use   Smoking status: Every Day    Packs/day: 1.00    Years: 23.00    Pack years: 23.00    Types: Cigarettes   Smokeless tobacco: Never   Tobacco comments:    will provide material  Substance and Sexual Activity   Alcohol use: No    Comment: occassionally   Drug use: Not Currently    Types: Marijuana    Comment: Last use 11/29/16   Sexual activity: Never    Birth control/protection: None    Comment: occasional marijuana- none recently  Other Topics Concern   Not on file  Social History Narrative   From a group home in [place]   Social Determinants of Health   Financial Resource Strain: Not on file  Food Insecurity: Not  on file  Transportation Needs: Not on file  Physical Activity: Not on file  Stress: Not on file  Social Connections: Not on file    Tobacco Counseling Ready to quit: Not Answered Counseling given: Not Answered Tobacco comments: will provide material   Clinical Intake:                 Diabetic?***         Activities of Daily Living No flowsheet data found.  Patient Care Team: Corky Downs, MD as PCP - General (Internal Medicine)  Indicate any recent Medical Services you may have received from other than [facility] providers in the past year (date may be approximate).     Assessment:   This is a routine wellness examination for Lance [name].  Hearing/Vision screen No results found.  Dietary issues and exercise activities discussed:     Goals Addressed   None    Depression Screen No flowsheet data found.  Fall Risk No flowsheet data found.  FALL RISK PREVENTION PERTAINING TO THE HOME:  Any stairs in or around the home? {YES/NO:21197} If so, are there any without handrails? {YES/NO:21197} Home free of loose throw rugs in walkways, pet beds, electrical cords, etc? {YES/NO:21197} Adequate lighting in your home to reduce risk of falls? {YES/NO:21197}  ASSISTIVE DEVICES UTILIZED TO PREVENT FALLS:  Life alert? {YES/NO:21197} Use of a cane, walker or w/c? {YES/NO:21197} Grab bars in the bathroom? {YES/NO:21197} Shower chair or bench in shower? {YES/NO:21197} Elevated toilet seat or a handicapped toilet? {YES/NO:21197}  TIMED UP AND GO:  Was the test performed? {YES/NO:21197}.  Length of time to ambulate 10 feet: *** sec.   {Appearance of ZESP:2330076}  Cognitive Function:        Immunizations Immunization History  Administered Date(s) Administered   Influenza,inj,Quad PF,6+ Mos 05/22/2016, 07/17/2020   Pneumococcal Polysaccharide-23 05/22/2016    {TDAP status:2101805}  {Flu Vaccine status:2101806}  {Pneumococcal vaccine  status:2101807}  {Covid-19 vaccine status:2101808}  Qualifies for Shingles Vaccine? {YES/NO:21197}  Zostavax completed {YES/NO:21197}  {Shingrix Completed?:2101804}  Screening Tests Health Maintenance  Topic Date Due   COVID-19 Vaccine (1) Never done   OPHTHALMOLOGY EXAM  Never done   Hepatitis C Screening  Never done   TETANUS/TDAP  Never done   Pneumococcal Vaccine 13-17 Years old (2 - PCV) 05/22/2017   COLONOSCOPY (Pts 45-30yrs Insurance coverage will need to be confirmed)  Never done   FOOT EXAM  09/24/2020   INFLUENZA VACCINE  01/13/2021   HEMOGLOBIN A1C  05/14/2021   PNEUMOCOCCAL POLYSACCHARIDE VACCINE AGE 78-64 HIGH RISK  Completed   HIV Screening  Completed   HPV VACCINES  Aged Out  Health Maintenance  Health Maintenance Due  Topic Date Due   COVID-19 Vaccine (1) Never done   OPHTHALMOLOGY EXAM  Never done   Hepatitis C Screening  Never done   TETANUS/TDAP  Never done   Pneumococcal Vaccine 25-46 Years old (2 - PCV) 05/22/2017   COLONOSCOPY (Pts 45-55yrs Insurance coverage will need to be confirmed)  Never done   FOOT EXAM  09/24/2020    {Colorectal cancer screening:2101809}  Lung Cancer Screening: (Low Dose CT Chest recommended if Age 38-80 years, 30 pack-year currently smoking OR have quit w/in 15years.) {DOES NOT does:27190::"does not"} qualify.   Lung Cancer Screening Referral: ***  Additional Screening:  Hepatitis C Screening: {DOES NOT does:27190::"does not"} qualify; Completed ***  Vision Screening: Recommended annual ophthalmology exams for early detection of glaucoma and other disorders of the eye. Is the patient up to date with their annual eye exam?  {YES/NO:21197} Who is the provider or what is the name of the office in which the patient attends annual eye exams? *** If pt is not established with a provider, would they like to be referred to a provider to establish care? {YES/NO:21197}.   Dental Screening: Recommended annual dental exams for  proper oral hygiene  Community Resource Referral / Chronic Care Management: CRR required this visit?  {YES/NO:21197}  CCM required this visit?  {YES/NO:21197}     Plan:     I have personally reviewed and noted the following in the patient's chart:   Medical and social history Use of alcohol, tobacco or illicit drugs  Current medications and supplements including opioid prescriptions. {Opioid Prescriptions:3647673439} Functional ability and status Nutritional status Physical activity Advanced directives List of other physicians Hospitalizations, surgeries, and ER visits in previous 12 months Vitals Screenings to include cognitive, depression, and falls Referrals and appointments  In addition, I have reviewed and discussed with patient certain preventive protocols, quality metrics, and best practice recommendations. A written personalized care plan for preventive services as well as general preventive health recommendations were provided to patient.     Melody Comas, [place]   01/10/2021   Nurse Notes: ***

## 2021-02-10 ENCOUNTER — Encounter: Payer: Self-pay | Admitting: Podiatry

## 2021-02-10 ENCOUNTER — Other Ambulatory Visit: Payer: Self-pay

## 2021-02-10 ENCOUNTER — Ambulatory Visit (INDEPENDENT_AMBULATORY_CARE_PROVIDER_SITE_OTHER): Payer: Medicare Other | Admitting: Podiatry

## 2021-02-10 DIAGNOSIS — E119 Type 2 diabetes mellitus without complications: Secondary | ICD-10-CM | POA: Diagnosis not present

## 2021-02-10 DIAGNOSIS — Q828 Other specified congenital malformations of skin: Secondary | ICD-10-CM | POA: Diagnosis not present

## 2021-02-10 DIAGNOSIS — B351 Tinea unguium: Secondary | ICD-10-CM | POA: Diagnosis not present

## 2021-02-10 DIAGNOSIS — M79676 Pain in unspecified toe(s): Secondary | ICD-10-CM | POA: Diagnosis not present

## 2021-02-10 DIAGNOSIS — Z7901 Long term (current) use of anticoagulants: Secondary | ICD-10-CM

## 2021-02-10 NOTE — Progress Notes (Signed)
This patient returns to my office for at risk foot care.  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  Patient is taking eliquiss.  . This patient is unable to cut nails himself since the patient cannot reach his nails.These nails are painful walking and wearing shoes.  This patient presents for at risk foot care today.  General Appearance  Alert, conversant and in no acute stress.  Vascular  Dorsalis pedis and posterior tibial  pulses are palpable  bilaterally.  Capillary return is within normal limits  bilaterally. Temperature is within normal limits  bilaterally.  Neurologic  Senn-Weinstein monofilament wire test within normal limits  bilaterally. Muscle power within normal limits bilaterally.  Nails Thick disfigured discolored nails with subungual debris  from hallux to fifth toes bilaterally. No evidence of bacterial infection or drainage bilaterally. Nail spicules noted hallux nails  B/L.  Orthopedic  No limitations of motion  feet .  No crepitus or effusions noted.  No bony pathology or digital deformities noted.  Skin  normotropic skin  noted bilaterally.  No signs of infections or ulcers noted.  Porokeratosis sub 4 right foot.  Onychomycosis  Pain in right toes  Pain in left toes    Consent was obtained for treatment procedures.   Mechanical debridement of nails 1-5  bilaterally performed with a nail nipper.  Filed with dremel without incident. Debride porokeratosis sub 4  With # 15 blade.   Return office visit   4 months                   Told patient to return for periodic foot care and evaluation due to potential at risk complications.   Quilla Freeze DPM  

## 2021-02-24 ENCOUNTER — Ambulatory Visit: Payer: Medicare Other | Admitting: Internal Medicine

## 2021-02-24 ENCOUNTER — Other Ambulatory Visit: Payer: Self-pay | Admitting: Internal Medicine

## 2021-03-05 ENCOUNTER — Encounter: Payer: Self-pay | Admitting: Internal Medicine

## 2021-03-05 ENCOUNTER — Ambulatory Visit (INDEPENDENT_AMBULATORY_CARE_PROVIDER_SITE_OTHER): Payer: Medicare Other | Admitting: Internal Medicine

## 2021-03-05 ENCOUNTER — Other Ambulatory Visit: Payer: Self-pay

## 2021-03-05 VITALS — BP 117/75 | HR 73 | Ht 70.0 in | Wt 181.0 lb

## 2021-03-05 DIAGNOSIS — E782 Mixed hyperlipidemia: Secondary | ICD-10-CM

## 2021-03-05 DIAGNOSIS — I10 Essential (primary) hypertension: Secondary | ICD-10-CM

## 2021-03-05 DIAGNOSIS — E119 Type 2 diabetes mellitus without complications: Secondary | ICD-10-CM

## 2021-03-05 DIAGNOSIS — F17200 Nicotine dependence, unspecified, uncomplicated: Secondary | ICD-10-CM | POA: Diagnosis not present

## 2021-03-05 DIAGNOSIS — Z7901 Long term (current) use of anticoagulants: Secondary | ICD-10-CM | POA: Diagnosis not present

## 2021-03-05 NOTE — Assessment & Plan Note (Signed)
Blood pressure is stable 

## 2021-03-05 NOTE — Assessment & Plan Note (Signed)
Counseled patient on the dangers of tobacco use, advised patient to stop smoking, and reviewed strategies to maximize success Smoking cessation instruction/counseling given:  counseled patient on the dangers of tobacco use, advised patient to stop smoking, and reviewed strategies to maximize success It is very important that pt quit smoking. There are various alternatives available to help with this difficult task, but first and foremost, pt must make a firm commitment and decision to quit. The nature of nicotine addiction is discussed. The usefulness of behavioral therapy is discussed and suggested.  The correct use, cost and side effects of nicotine replacement therapy such as gum or patches is discussed. Bupropion and its cost (sometimes not covered fully by insurance) and side effects are reviewed. The quit rates are discussed. I recommend pt not allow potential costs of treatment to deter ptfrom using nicotine replacement therapy or bupropion, as the long term economic and health benefits are obvious.  ?

## 2021-03-05 NOTE — Assessment & Plan Note (Signed)
Blood sugar is under control patient takes his medicine regularly

## 2021-03-05 NOTE — Assessment & Plan Note (Signed)
Hypercholesterolemia  I advised the patient to follow Mediterranean diet This diet is rich in fruits vegetables and whole grain, and This diet is also rich in fish and lean meat Patient should also eat a handful of almonds or walnuts daily Recent heart study indicated that average follow-up on this kind of diet reduces the cardiovascular mortality by 50 to 70%== 

## 2021-03-05 NOTE — Progress Notes (Signed)
Established Patient Office Visit  Subjective:  Patient ID: Lance [name], Lance [name]    DOB: 1975-09-29  Age: 45 y.o. MRN: [id number]  CC:  Chief Complaint  Patient presents with   General check up    HPI  Lance [name] presents for general checkup  Past Medical History:  Diagnosis Date   Depression    Diabetes mellitus without complication (HCC)    Hyperlipidemia    Hypertension    Lupus anticoagulant disorder (HCC) 06/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   PE (pulmonary thromboembolism) (HCC)    Schizo affective schizophrenia (HCC)    Supratherapeutic INR 11/19/2016    History reviewed. No pertinent surgical history.  Family History  Problem Relation Age of Onset   CAD Mother    CAD Sister     Social History   Socioeconomic History   Marital status: Single    Spouse name: Not on file   Number of children: Not on file   Years of education: Not on file   Highest education level: Not on file  Occupational History   Not on file  Tobacco Use   Smoking status: Every Day    Packs/day: 1.00    Years: 23.00    Pack years: 23.00    Types: Cigarettes   Smokeless tobacco: Never   Tobacco comments:    will provide material  Substance and Sexual Activity   Alcohol use: No    Comment: occassionally   Drug use: Not Currently    Types: Marijuana    Comment: Last use 11/29/16   Sexual activity: Never    Birth control/protection: None    Comment: occasional marijuana- none recently  Other Topics Concern   Not on file  Social History Narrative   From a group home in [place]   Social Determinants of Health   Financial Resource Strain: Not on file  Food Insecurity: Not on file  Transportation Needs: Not on file  Physical Activity: Not on file  Stress: Not on file  Social Connections: Not on file  Intimate Partner Violence: Not on file     Current Outpatient Medications:    ADVAIR DISKUS 250-50 MCG/ACT AEPB, Inhale 1 puff into the lungs 2  (two) times daily., Disp: , Rfl:    albuterol (VENTOLIN HFA) 108 (90 Base) MCG/ACT inhaler, Inhale 1-2 puffs into the lungs every 6 (six) hours as needed for wheezing or shortness of breath., Disp: 18 g, Rfl: 1   cloZAPine (CLOZARIL) 100 MG tablet, Take 5 tablets (500 mg total) by mouth at bedtime., Disp: 150 tablet, Rfl: 1   ELIQUIS 5 MG TABS tablet, TAKE 1 TABLET BY MOUTH TWICE DAILY., Disp: 60 tablet, Rfl: 11   glucose blood (FORA V30A BLOOD GLUCOSE TEST) test strip, Use as instructed, Disp: 100 each, Rfl: 12   hydrOXYzine (ATARAX/VISTARIL) 50 MG tablet, Take 1 tablet (50 mg total) by mouth at bedtime., Disp: 30 tablet, Rfl: 1   hydrOXYzine (VISTARIL) 50 MG capsule, Take 50 mg by mouth 2 (two) times daily as needed., Disp: , Rfl:    LEVEMIR FLEXTOUCH 100 UNIT/ML FlexPen, Inject 10 Units into the skin 2 (two) times daily., Disp: , Rfl:    lisinopril (ZESTRIL) 2.5 MG tablet, TAKE 1 TABLET EVERY MORNING FOR BLOOD PRESSURE., Disp: 30 tablet, Rfl: 11   lithium carbonate (ESKALITH) 450 MG CR tablet, Take 1 tablet (450 mg total) by mouth at bedtime., Disp: 30 tablet, Rfl: 1   lithium carbonate (LITHOBID)  300 MG CR tablet, Take 1 tablet (300 mg total) by mouth in the morning., Disp: 30 tablet, Rfl: 1   metFORMIN (GLUCOPHAGE) 1000 MG tablet, TAKE 1 TABLET BY MOUTH TWICE DAILY., Disp: 60 tablet, Rfl: 11   NOVOFINE AUTOCOVER PEN NEEDLE 30G X 8 MM MISC, USE WITH LEVEMIR TWICE DAILY., Disp: 100 each, Rfl: 11   OLANZapine (ZYPREXA) 20 MG tablet, Take 1 tablet (20 mg total) by mouth at bedtime., Disp: 30 tablet, Rfl: 1   Omega-3 Fatty Acids (FISH OIL) 1000 MG CAPS, TAKE 1 CAPSULE BY MOUTH ONCE A DAY., Disp: 30 capsule, Rfl: 11   propranolol (INDERAL) 20 MG tablet, Take 1 tablet (20 mg total) by mouth at bedtime., Disp: 30 tablet, Rfl: 1   sertraline (ZOLOFT) 100 MG tablet, Take 100 mg by mouth daily., Disp: , Rfl:    sertraline (ZOLOFT) 50 MG tablet, Take 1 tablet (50 mg total) by mouth daily., Disp: 30 tablet,  Rfl: 1   simvastatin (ZOCOR) 40 MG tablet, Take 1 tablet (40 mg total) by mouth daily at 6 PM., Disp: 30 tablet, Rfl: 6   traZODone (DESYREL) 100 MG tablet, Take 1 tablet (100 mg total) by mouth at bedtime., Disp: 30 tablet, Rfl: 1   Allergies  Allergen Reactions   Penicillins Other (See Comments)    Reaction: "lockjaw" Has patient had a PCN reaction causing immediate rash, facial/tongue/throat swelling, SOB or lightheadedness with hypotension: No Has patient had a PCN reaction causing severe rash involving mucus membranes or skin necrosis: No Has patient had a PCN reaction that required hospitalization: No Has patient had a PCN reaction occurring within the last 10 years: No If all of the above answers are "NO", then may proceed with Cephalosporin use.     ROS Review of Systems  Constitutional: Negative.   HENT: Negative.    Eyes: Negative.   Respiratory: Negative.    Cardiovascular: Negative.   Gastrointestinal: Negative.   Endocrine: Negative.   Genitourinary: Negative.   Musculoskeletal: Negative.   Skin: Negative.   Allergic/Immunologic: Negative.   Neurological: Negative.   Hematological: Negative.   Psychiatric/Behavioral: Negative.    All other systems reviewed and are negative.    Objective:    Physical Exam  BP 117/75   Pulse 73   Ht 5\' 10"  (1.778 m)   Wt 181 lb (82.1 kg)   BMI 25.97 kg/m  Wt Readings from Last 3 Encounters:  03/05/21 181 lb (82.1 kg)  11/30/20 195 lb (88.5 kg)  11/09/20 182 lb 1.6 oz (82.6 kg)     Health Maintenance Due  Topic Date Due   COVID-19 Vaccine (1) Never done   OPHTHALMOLOGY EXAM  Never done   Hepatitis C Screening  Never done   TETANUS/TDAP  Never done   COLONOSCOPY (Pts 45-47yrs Insurance coverage will need to be confirmed)  Never done   FOOT EXAM  09/24/2020   INFLUENZA VACCINE  01/13/2021    There are no preventive care reminders to display for this patient.  Lab Results  Component Value Date   TSH 3.619  11/11/2019   Lab Results  Component Value Date   WBC 4.8 11/30/2020   WBC 4.7 11/30/2020   HGB 14.4 11/30/2020   HGB 14.4 11/30/2020   HCT 41.4 11/30/2020   HCT 41.9 11/30/2020   MCV 92.6 11/30/2020   MCV 93.9 11/30/2020   PLT 191 11/30/2020   PLT 205 11/30/2020   Lab Results  Component Value Date   NA 140 11/30/2020  K 3.7 11/30/2020   CO2 19 (L) 11/30/2020   GLUCOSE 165 (H) 11/30/2020   BUN 22 (H) 11/30/2020   CREATININE 1.11 11/30/2020   BILITOT 0.7 11/30/2020   ALKPHOS 54 11/30/2020   AST 20 11/30/2020   ALT 18 11/30/2020   PROT 7.0 11/30/2020   ALBUMIN 4.6 11/30/2020   CALCIUM 9.7 11/30/2020   ANIONGAP 8 11/30/2020   Lab Results  Component Value Date   CHOL 157 09/20/2020   Lab Results  Component Value Date   HDL 32 (L) 09/20/2020   Lab Results  Component Value Date   LDLCALC UNABLE TO CALCULATE IF TRIGLYCERIDE OVER 400 mg/dL 56/21/3086   Lab Results  Component Value Date   TRIG 447 (H) 09/20/2020   Lab Results  Component Value Date   CHOLHDL 4.9 09/20/2020   Lab Results  Component Value Date   HGBA1C 5.4 11/12/2020      Assessment & Plan:   Problem List Items Addressed This Visit       Cardiovascular and Mediastinum   Hypertension    Blood pressure is stable        Endocrine   Diabetes mellitus without complication (HCC) - Primary    Blood sugar is under control patient takes his medicine regularly        Other   Hyperlipidemia    Hypercholesterolemia  I advised the patient to follow Mediterranean diet This diet is rich in fruits vegetables and whole grain, and This diet is also rich in fish and lean meat Patient should also eat a handful of almonds or walnuts daily Recent heart study indicated that average follow-up on this kind of diet reduces the cardiovascular mortality by 50 to 70%==      Chronic anticoagulation    Patient takes his medicine regularly      Tobacco use disorder    Counseled patient on the dangers of  tobacco use, advised patient to stop smoking, and reviewed strategies to maximize success Smoking cessation instruction/counseling given:  counseled patient on the dangers of tobacco use, advised patient to stop smoking, and reviewed strategies to maximize success It is very important that pt quit smoking. There are various alternatives available to help with this difficult task, but first and foremost, pt must make a firm commitment and decision to quit. The nature of nicotine addiction is discussed. The usefulness of behavioral therapy is discussed and suggested.  The correct use, cost and side effects of nicotine replacement therapy such as gum or patches is discussed. Bupropion and its cost (sometimes not covered fully by insurance) and side effects are reviewed. The quit rates are discussed. I recommend pt not allow potential costs of treatment to deter ptfrom using nicotine replacement therapy or bupropion, as the long term economic and health benefits are obvious.        No orders of the defined types were placed in this encounter.   Follow-up: No follow-ups on file.    Corky Downs, MD

## 2021-03-05 NOTE — Assessment & Plan Note (Signed)
Patient takes his medicine regularly 

## 2021-03-07 ENCOUNTER — Ambulatory Visit: Payer: Medicare Other

## 2021-03-20 ENCOUNTER — Ambulatory Visit (INDEPENDENT_AMBULATORY_CARE_PROVIDER_SITE_OTHER): Payer: Medicare Other | Admitting: *Deleted

## 2021-03-20 ENCOUNTER — Other Ambulatory Visit: Payer: Self-pay | Admitting: *Deleted

## 2021-03-20 DIAGNOSIS — Z0100 Encounter for examination of eyes and vision without abnormal findings: Secondary | ICD-10-CM | POA: Diagnosis not present

## 2021-03-20 DIAGNOSIS — Z1211 Encounter for screening for malignant neoplasm of colon: Secondary | ICD-10-CM | POA: Diagnosis not present

## 2021-03-20 DIAGNOSIS — Z720 Tobacco use: Secondary | ICD-10-CM

## 2021-03-20 DIAGNOSIS — Z0000 Encounter for general adult medical examination without abnormal findings: Secondary | ICD-10-CM

## 2021-03-20 DIAGNOSIS — Z122 Encounter for screening for malignant neoplasm of respiratory organs: Secondary | ICD-10-CM

## 2021-03-20 DIAGNOSIS — E119 Type 2 diabetes mellitus without complications: Secondary | ICD-10-CM

## 2021-03-20 NOTE — Progress Notes (Signed)
I have reviewed this visit and agree with the documentation.   

## 2021-03-20 NOTE — Progress Notes (Signed)
Subjective:   Lance [name] is a 45 y.o. male who presents for an Initial Medicare Annual Wellness Visit.  I discussed the limitations of evaluation and management by telemedicine and the availability of in person appointments. Patient/Caregiver expressed understanding and agreed to proceed.   Visit performed using audio  Patient/Caregiver:home Provider:home   Review of Systems    Defer to provider   Cardiac Risk Factors include: diabetes mellitus;male gender;smoking/ tobacco exposure     Objective:    There were no vitals filed for this visit. There is no height or weight on file to calculate BMI.  Advanced Directives 03/20/2021 11/09/2020 09/19/2020 01/04/2020 11/10/2019 03/17/2019 01/14/2018  Does Patient Have a Medical Advance Directive? Unable to assess, patient is non-responsive or altered mental status No No No No No No  Would patient like information on creating a medical advance directive? - - - - No - Patient declined - -  Some encounter information is confidential and restricted. Go to Review Flowsheets activity to see all data.    Current Medications (verified) Outpatient Encounter Medications as of 03/20/2021  Medication Sig   ADVAIR DISKUS 250-50 MCG/ACT AEPB Inhale 1 puff into the lungs 2 (two) times daily.   albuterol (VENTOLIN HFA) 108 (90 Base) MCG/ACT inhaler Inhale 1-2 puffs into the lungs every 6 (six) hours as needed for wheezing or shortness of breath.   cloZAPine (CLOZARIL) 100 MG tablet Take 5 tablets (500 mg total) by mouth at bedtime.   ELIQUIS 5 MG TABS tablet TAKE 1 TABLET BY MOUTH TWICE DAILY.   glucose blood (FORA V30A BLOOD GLUCOSE TEST) test strip Use as instructed   hydrOXYzine (ATARAX/VISTARIL) 50 MG tablet Take 1 tablet (50 mg total) by mouth at bedtime.   hydrOXYzine (VISTARIL) 50 MG capsule Take 50 mg by mouth 2 (two) times daily as needed.   LEVEMIR FLEXTOUCH 100 UNIT/ML FlexPen Inject 10 Units into the skin 2 (two) times daily.   lisinopril  (ZESTRIL) 2.5 MG tablet TAKE 1 TABLET EVERY MORNING FOR BLOOD PRESSURE.   lithium carbonate (ESKALITH) 450 MG CR tablet Take 1 tablet (450 mg total) by mouth at bedtime.   lithium carbonate (LITHOBID) 300 MG CR tablet Take 1 tablet (300 mg total) by mouth in the morning.   metFORMIN (GLUCOPHAGE) 1000 MG tablet TAKE 1 TABLET BY MOUTH TWICE DAILY.   NOVOFINE AUTOCOVER PEN NEEDLE 30G X 8 MM MISC USE WITH LEVEMIR TWICE DAILY.   OLANZapine (ZYPREXA) 20 MG tablet Take 1 tablet (20 mg total) by mouth at bedtime.   Omega-3 Fatty Acids (FISH OIL) 1000 MG CAPS TAKE 1 CAPSULE BY MOUTH ONCE A DAY.   propranolol (INDERAL) 20 MG tablet Take 1 tablet (20 mg total) by mouth at bedtime.   sertraline (ZOLOFT) 100 MG tablet Take 100 mg by mouth daily.   sertraline (ZOLOFT) 50 MG tablet Take 1 tablet (50 mg total) by mouth daily.   simvastatin (ZOCOR) 40 MG tablet Take 1 tablet (40 mg total) by mouth daily at 6 PM.   traZODone (DESYREL) 100 MG tablet Take 1 tablet (100 mg total) by mouth at bedtime.   No facility-administered encounter medications on file as of 03/20/2021.    Allergies (verified) Penicillins   History: Past Medical History:  Diagnosis Date   Depression    Diabetes mellitus without complication (HCC)    Hyperlipidemia    Hypertension    Lupus anticoagulant disorder (HCC) 06/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   PE (  pulmonary thromboembolism) (HCC)    Schizo affective schizophrenia (HCC)    Supratherapeutic INR 11/19/2016   History reviewed. No pertinent surgical history. Family History  Problem Relation Age of Onset   CAD Mother    CAD Sister    Social History   Socioeconomic History   Marital status: Single    Spouse name: Not on file   Number of children: Not on file   Years of education: Not on file   Highest education level: Not on file  Occupational History   Not on file  Tobacco Use   Smoking status: Every Day    Packs/day: 1.00    Years: 23.00    Pack  years: 23.00    Types: Cigarettes   Smokeless tobacco: Never   Tobacco comments:    will provide material  Substance and Sexual Activity   Alcohol use: No    Comment: occassionally   Drug use: Not Currently    Types: Marijuana    Comment: Last use 11/29/16   Sexual activity: Never    Birth control/protection: None    Comment: occasional marijuana- none recently  Other Topics Concern   Not on file  Social History Narrative   From a group home in [place]   Social Determinants of Health   Financial Resource Strain: Low Risk    Difficulty of Paying Living Expenses: Not very hard  Food Insecurity: No Food Insecurity   Worried About Programme researcher, broadcasting/film/video in the Last Year: Never true   Ran Out of Food in the Last Year: Never true  Transportation Needs: No Transportation Needs   Lack of Transportation (Medical): No   Lack of Transportation (Non-Medical): No  Physical Activity: Insufficiently Active   Days of Exercise per Week: 3 days   Minutes of Exercise per Session: 20 min  Stress: No Stress Concern Present   Feeling of Stress : Only a little  Social Connections: Socially Isolated   Frequency of Communication with Friends and Family: More than three times a week   Frequency of Social Gatherings with Friends and Family: More than three times a week   Attends Religious Services: Never   Database administrator or Organizations: No   Attends Engineer, structural: Never   Marital Status: Never married    Tobacco Counseling Ready to quit: Not Answered Counseling given: Not Answered Tobacco comments: will provide material   Clinical Intake:  Pre-visit preparation completed: Yes  Pain : No/denies pain     Nutritional Risks: None Diabetes: Yes CBG done?: No Did pt. bring in CBG monitor from home?: No  How often do you need to have someone help you when you read instructions, pamphlets, or other written materials from your doctor or pharmacy?: 3 -  Sometimes What is the last grade level you completed in school?: Caregiver usure  Diabetic?Yes  Interpreter Needed?: No  Information entered by :: Melody Comas, CMA   Activities of Daily Living In your present state of health, do you have any difficulty performing the following activities: 03/20/2021  Hearing? N  Vision? N  Difficulty concentrating or making decisions? N  Walking or climbing stairs? N  Dressing or bathing? N  Doing errands, shopping? Y  Comment patient has caregiver - he is resident of group home  Preparing Food and eating ? Y  Comment caregiver prepares meals  Using the Toilet? N  In the past six months, have you accidently leaked urine? N  Do you have problems with  loss of bowel control? N  Managing your Medications? Y  Comment Caregiver gives meds  Managing your Finances? Y  Housekeeping or managing your Housekeeping? Y  Some encounter information is confidential and restricted. Go to Review Flowsheets activity to see all data.  Some recent data might be hidden    Patient Care Team: Corky Downs, MD as PCP - General (Internal Medicine)  Indicate any recent Medical Services you may have received from other than [facility] providers in the past year (date may be approximate).     Assessment:   This is a routine wellness examination for [place].  Hearing/Vision screen No results found.  Dietary issues and exercise activities discussed: Current Exercise Habits: Home exercise routine (activities at the group home), Type of exercise: walking, Time (Minutes): 20, Frequency (Times/Week): 7, Weekly Exercise (Minutes/Week): 140, Intensity: Mild, Exercise limited by: psychological condition(s)   Goals Addressed   None    Depression Screen PHQ 2/9 Scores 03/20/2021 03/20/2021  PHQ - 2 Score 0 0    Fall Risk Fall Risk  03/20/2021  Falls in the past year? 0  Number falls in past yr: 0  Injury with Fall? 0  Risk for fall due to : No Fall Risks  Follow up  Falls evaluation completed    FALL RISK PREVENTION PERTAINING TO THE HOME:  Any stairs in or around the home? No  If so, are there any without handrails? No  Home free of loose throw rugs in walkways, pet beds, electrical cords, etc? Yes  Adequate lighting in your home to reduce risk of falls? Yes   ASSISTIVE DEVICES UTILIZED TO PREVENT FALLS:  Life alert? No  Use of a cane, walker or w/c? No  Grab bars in the bathroom? Yes  Shower chair or bench in shower? No  Elevated toilet seat or a handicapped toilet? No   TIMED UP AND GO:  Was the test performed? No .  Length of time to ambulate: NA  Gait steady and fast without use of assistive device  Cognitive Function: MMSE - Mini Mental State Exam 03/20/2021  Not completed: Unable to complete     6CIT Screen 03/20/2021  What Year? 0 points  What month? 0 points  What time? 0 points  Count back from 20 0 points  Months in reverse (No Data)  Repeat phrase 0 points    Immunizations Immunization History  Administered Date(s) Administered   Influenza,inj,Quad PF,6+ Mos 05/22/2016, 07/17/2020   Pneumococcal Polysaccharide-23 05/22/2016    TDAP status: Due, Education has been provided regarding the importance of this vaccine. Advised may receive this vaccine at local pharmacy or Health Dept. Aware to provide a copy of the vaccination record if obtained from local pharmacy or Health Dept. Verbalized acceptance and understanding.  Flu Vaccine status: Due, Education has been provided regarding the importance of this vaccine. Advised may receive this vaccine at local pharmacy or Health Dept. Aware to provide a copy of the vaccination record if obtained from local pharmacy or Health Dept. Verbalized acceptance and understanding.  Pneumococcal vaccine status: Due, Education has been provided regarding the importance of this vaccine. Advised may receive this vaccine at local pharmacy or Health Dept. Aware to provide a copy of the  vaccination record if obtained from local pharmacy or Health Dept. Verbalized acceptance and understanding.  Covid-19 vaccine status: Completed vaccines  Qualifies for Shingles Vaccine? No   Zostavax completed No   Shingrix Completed?: No.    Education has been provided regarding the  importance of this vaccine. Patient has been advised to call insurance company to determine out of pocket expense if they have not yet received this vaccine. Advised may also receive vaccine at local pharmacy or Health Dept. Verbalized acceptance and understanding.  Screening Tests Health Maintenance  Topic Date Due   COVID-19 Vaccine (1) Never done   OPHTHALMOLOGY EXAM  Never done   Hepatitis C Screening  Never done   TETANUS/TDAP  Never done   COLONOSCOPY (Pts 45-81yrs Insurance coverage will need to be confirmed)  Never done   FOOT EXAM  09/24/2020   INFLUENZA VACCINE  01/13/2021   HEMOGLOBIN A1C  05/14/2021   HIV Screening  Completed   HPV VACCINES  Aged Out    Health Maintenance  Health Maintenance Due  Topic Date Due   COVID-19 Vaccine (1) Never done   OPHTHALMOLOGY EXAM  Never done   Hepatitis C Screening  Never done   TETANUS/TDAP  Never done   COLONOSCOPY (Pts 45-81yrs Insurance coverage will need to be confirmed)  Never done   FOOT EXAM  09/24/2020   INFLUENZA VACCINE  01/13/2021    Colorectal cancer screening: Referral to GI placed today to [facility]. Pt aware the office will call re: appt.  Lung Cancer Screening: (Low Dose CT Chest recommended if Age 81-80 years, 30 pack-year currently smoking OR have quit w/in 15years.) does qualify.   Lung Cancer Screening Referral: Placed tody   Additional Screening:  Hepatitis C Screening: does qualify; Completed NO  Vision Screening: Recommended annual ophthalmology exams for early detection of glaucoma and other disorders of the eye. Is the patient up to date with their annual eye exam?  No  Who is the provider or what is the name of  the office in which the patient attends annual eye exams? Does not have a provider If pt is not established with a provider, would they like to be referred to a provider to establish care? Yes .   Dental Screening: Recommended annual dental exams for proper oral hygiene  Community Resource Referral / Chronic Care Management: CRR required this visit?  No   CCM required this visit?  No      Plan:     I have personally reviewed and noted the following in the patient's chart:   Medical and social history Use of alcohol, tobacco or illicit drugs  Current medications and supplements including opioid prescriptions. Patient is not currently taking opioid prescriptions. Functional ability and status Nutritional status Physical activity Advanced directives List of other physicians Hospitalizations, surgeries, and ER visits in previous 12 months Vitals Screenings to include cognitive, depression, and falls Referrals and appointments  In addition, I have reviewed and discussed with patient certain preventive protocols, quality metrics, and best practice recommendations. A written personalized care plan for preventive services as well as general preventive health recommendations were provided to patient.     Melody Comas, [place]   03/20/2021   Nurse Notes: Mr. Corpus , Thank you for taking time to come for your Medicare Wellness Visit. I appreciate your ongoing commitment to your health goals. Please review the following plan we discussed and let me know if I can assist you in the future.   These are the goals we discussed:  Goals   None     This is a list of the screening recommended for you and due dates:  Health Maintenance  Topic Date Due   COVID-19 Vaccine (1) Never done   Eye exam for  diabetics  Never done   Hepatitis C Screening: USPSTF Recommendation to screen - Ages 55-79 yo.  Never done   Tetanus Vaccine  Never done   Colon Cancer Screening  Never done   Complete foot  exam   09/24/2020   Flu Shot  01/13/2021   Hemoglobin A1C  05/14/2021   HIV Screening  Completed   HPV Vaccine  Aged Out    [place] Sharlyn Odonnel    Time spent with patient 30 min

## 2021-03-25 ENCOUNTER — Telehealth: Payer: Self-pay

## 2021-03-25 NOTE — Telephone Encounter (Signed)
CALLED PATIENT NO WAY TO LEAVE A MESSAGE

## 2021-03-28 ENCOUNTER — Other Ambulatory Visit: Payer: Self-pay | Admitting: Internal Medicine

## 2021-04-17 ENCOUNTER — Other Ambulatory Visit: Payer: Self-pay | Admitting: Internal Medicine

## 2021-06-01 ENCOUNTER — Other Ambulatory Visit: Payer: Self-pay

## 2021-06-01 DIAGNOSIS — F209 Schizophrenia, unspecified: Secondary | ICD-10-CM | POA: Diagnosis not present

## 2021-06-01 DIAGNOSIS — I10 Essential (primary) hypertension: Secondary | ICD-10-CM | POA: Insufficient documentation

## 2021-06-01 DIAGNOSIS — Z20822 Contact with and (suspected) exposure to covid-19: Secondary | ICD-10-CM | POA: Insufficient documentation

## 2021-06-01 DIAGNOSIS — E119 Type 2 diabetes mellitus without complications: Secondary | ICD-10-CM | POA: Insufficient documentation

## 2021-06-01 DIAGNOSIS — Z046 Encounter for general psychiatric examination, requested by authority: Secondary | ICD-10-CM | POA: Diagnosis present

## 2021-06-01 DIAGNOSIS — Z794 Long term (current) use of insulin: Secondary | ICD-10-CM | POA: Insufficient documentation

## 2021-06-01 DIAGNOSIS — F250 Schizoaffective disorder, bipolar type: Secondary | ICD-10-CM | POA: Diagnosis not present

## 2021-06-01 DIAGNOSIS — Z7984 Long term (current) use of oral hypoglycemic drugs: Secondary | ICD-10-CM | POA: Insufficient documentation

## 2021-06-01 DIAGNOSIS — Z7901 Long term (current) use of anticoagulants: Secondary | ICD-10-CM | POA: Insufficient documentation

## 2021-06-01 DIAGNOSIS — Y900 Blood alcohol level of less than 20 mg/100 ml: Secondary | ICD-10-CM | POA: Insufficient documentation

## 2021-06-01 DIAGNOSIS — F17210 Nicotine dependence, cigarettes, uncomplicated: Secondary | ICD-10-CM | POA: Insufficient documentation

## 2021-06-01 DIAGNOSIS — Z79899 Other long term (current) drug therapy: Secondary | ICD-10-CM | POA: Insufficient documentation

## 2021-06-01 LAB — URINE DRUG SCREEN, QUALITATIVE (ARMC ONLY)
Amphetamines, Ur Screen: NOT DETECTED
Barbiturates, Ur Screen: NOT DETECTED
Benzodiazepine, Ur Scrn: NOT DETECTED
Cannabinoid 50 Ng, Ur ~~LOC~~: NOT DETECTED
Cocaine Metabolite,Ur ~~LOC~~: NOT DETECTED
MDMA (Ecstasy)Ur Screen: NOT DETECTED
Methadone Scn, Ur: NOT DETECTED
Opiate, Ur Screen: NOT DETECTED
Phencyclidine (PCP) Ur S: NOT DETECTED
Tricyclic, Ur Screen: POSITIVE — AB

## 2021-06-01 LAB — COMPREHENSIVE METABOLIC PANEL
ALT: 13 U/L (ref 0–44)
AST: 16 U/L (ref 15–41)
Albumin: 4.6 g/dL (ref 3.5–5.0)
Alkaline Phosphatase: 64 U/L (ref 38–126)
Anion gap: 8 (ref 5–15)
BUN: 16 mg/dL (ref 6–20)
CO2: 22 mmol/L (ref 22–32)
Calcium: 9.3 mg/dL (ref 8.9–10.3)
Chloride: 110 mmol/L (ref 98–111)
Creatinine, Ser: 0.89 mg/dL (ref 0.61–1.24)
GFR, Estimated: >60 mL/min (ref >60)
Glucose, Bld: 145 mg/dL — ABNORMAL HIGH (ref 70–99)
Potassium: 3.8 mmol/L (ref 3.5–5.1)
Sodium: 140 mmol/L (ref 135–145)
Total Bilirubin: 0.5 mg/dL (ref 0.3–1.2)
Total Protein: 7 g/dL (ref 6.5–8.1)

## 2021-06-01 LAB — CBC
HCT: 40.7 % (ref 39.0–52.0)
Hemoglobin: 14.1 g/dL (ref 13.0–17.0)
MCH: 31.3 pg (ref 26.0–34.0)
MCHC: 34.6 g/dL (ref 30.0–36.0)
MCV: 90.4 fL (ref 80.0–100.0)
Platelets: 214 10*3/uL (ref 150–400)
RBC: 4.5 MIL/uL (ref 4.22–5.81)
RDW: 13 % (ref 11.5–15.5)
WBC: 4.3 10*3/uL (ref 4.0–10.5)
nRBC: 0 % (ref 0.0–0.2)

## 2021-06-01 LAB — ETHANOL: Alcohol, Ethyl (B): <10 mg/dL (ref <10)

## 2021-06-01 LAB — ACETAMINOPHEN LEVEL: Acetaminophen (Tylenol), Serum: <10 ug/mL — ABNORMAL LOW (ref 10–30)

## 2021-06-01 LAB — SALICYLATE LEVEL: Salicylate Lvl: <7 mg/dL — ABNORMAL LOW (ref 7.0–30.0)

## 2021-06-01 MED ORDER — ACETAMINOPHEN 325 MG PO TABS
650.0000 mg | ORAL_TABLET | Freq: Once | ORAL | Status: AC
  Administered 2021-06-01: 22:00:00 650 mg via ORAL

## 2021-06-01 NOTE — ED Notes (Addendum)
Pt belongings:  Programme researcher, broadcasting/film/video Black tennis shoes White socks Blue jeans [place] long sleeve shirt Tan hat Biochemist, clinical belt [employer] tshirt Blue tshirt Green plaid underwear Set of keys Black/red watch

## 2021-06-01 NOTE — ED Triage Notes (Addendum)
Pt to ED via [facility], [facility] voluntary from [facility] c/o suicidal and homicidal thoughts towards others at group home. Pt states he has had these thoughts on and off x3 yrs. Pt states he hears voices telling him to do things to harm others or he will be punished Pt calm and cooperative.

## 2021-06-01 NOTE — ED Notes (Signed)
Pt given [place] sandwich tray and gingerale. Showed pt where to throw trash away.

## 2021-06-02 ENCOUNTER — Emergency Department
Admission: EM | Admit: 2021-06-02 | Discharge: 2021-06-02 | Disposition: A | Discharge status: Home / Self Care | Payer: Medicare Other | Attending: Emergency Medicine | Admitting: Emergency Medicine

## 2021-06-02 DIAGNOSIS — F209 Schizophrenia, unspecified: Secondary | ICD-10-CM

## 2021-06-02 DIAGNOSIS — F250 Schizoaffective disorder, bipolar type: Secondary | ICD-10-CM | POA: Diagnosis not present

## 2021-06-02 DIAGNOSIS — F319 Bipolar disorder, unspecified: Secondary | ICD-10-CM | POA: Diagnosis present

## 2021-06-02 LAB — RESP PANEL BY RT-PCR (FLU A&B, COVID) ARPGX2
Influenza A by PCR: NEGATIVE
Influenza B by PCR: NEGATIVE
SARS Coronavirus 2 by RT PCR: NEGATIVE

## 2021-06-02 NOTE — ED Notes (Signed)
Contacted [name], pt's legal guardian, updated her in regards to pending d/c. Ms. Tiburcio Pea verbalized understanding.

## 2021-06-02 NOTE — ED Notes (Signed)
Patient ate 100 

## 2021-06-02 NOTE — ED Notes (Signed)
Attempted to call pt's legal guardian [name], unsuccessful at this time. This RN left a HIPAA compliant voice message at this time.

## 2021-06-02 NOTE — ED Notes (Signed)
Pt given snack. 

## 2021-06-02 NOTE — ED Notes (Addendum)
Charted in FYI pt resides at [facility], spoke with a representative there and states that they no longer have him as a resident anymore. Updated pt's chart. Spoke with pt and states he lives at [facility].

## 2021-06-02 NOTE — ED Notes (Signed)
Attempted to call Group Home with number listed. Unsuccessful attempt at this time.

## 2021-06-02 NOTE — Consult Note (Signed)
[facility] Face-to-Face Psychiatry Consult   Reason for Consult:  Depression/ passive SI Referring Physician: Su Hoff Patient Identification: Lance [name] MRN:  [id number] Principal Diagnosis: Schizoaffective disorder, bipolar type (HCC) Diagnosis:  Principal Problem:   Schizoaffective disorder, bipolar type (HCC)   Total Time spent with patient: 30 minutes  Subjective:   Lance [name] is a 45 y.o. male patient admitted with "It's the same old thing." HPI: Patient was seen and chart reviewed.  Patient presents to the hospital quite frequently with complaints of worsening depression, suicidal ideations.  Today he presents as very calm and cooperative.  He says that they are disrespectful to him at his group home and he does not like that.  He does not have any plan or intent on suicide. His SI is passive.  He shows writer's arms where he had previously tried to cut himself but has not done so recently.  He denies any homicidal ideations or threats toward anyone at the group home.  Denies auditory or visual hallucinations.  He does not appear to be responding to internal stimuli at this point, although he can get distracted.  Patient reports adequate sleep and appetite.  Denies any illicit drug use.  Patient does not require psychiatric inpatient hospitalization at this time.  Past Psychiatric History: Schizoaffective disorder. Frequently presents to the ED with complaints against group home.  Past history of nonsuicidal self-injurious behavior.  Risk to Self:   Risk to Others:   Prior Inpatient Therapy:   Prior Outpatient Therapy:    Past Medical History:  Past Medical History:  Diagnosis Date   Depression    Diabetes mellitus without complication (HCC)    Hyperlipidemia    Hypertension    Lupus anticoagulant disorder (HCC) 06/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   PE (pulmonary thromboembolism) (HCC)    Schizo affective schizophrenia (HCC)    Supratherapeutic INR 11/19/2016    History reviewed. No pertinent surgical history. Family History:  Family History  Problem Relation Age of Onset   CAD Mother    CAD Sister    Family Psychiatric  History: None reported Social History:  Social History   Substance and Sexual Activity  Alcohol Use No   Comment: occassionally     Social History   Substance and Sexual Activity  Drug Use Not Currently   Types: Marijuana   Comment: Last use 11/29/16    Social History   Socioeconomic History   Marital status: Single    Spouse name: Not on file   Number of children: Not on file   Years of education: Not on file   Highest education level: Not on file  Occupational History   Not on file  Tobacco Use   Smoking status: Every Day    Packs/day: 1.00    Years: 23.00    Pack years: 23.00    Types: Cigarettes   Smokeless tobacco: Never   Tobacco comments:    will provide material  Substance and Sexual Activity   Alcohol use: No    Comment: occassionally   Drug use: Not Currently    Types: Marijuana    Comment: Last use 11/29/16   Sexual activity: Never    Birth control/protection: None    Comment: occasional marijuana- none recently  Other Topics Concern   Not on file  Social History Narrative   From a group home in [place]   Social Determinants of Health   Financial Resource Strain: Low Risk  Difficulty of Paying Living Expenses: Not very hard  Food Insecurity: No Food Insecurity   Worried About Running Out of Food in the Last Year: Never true   Ran Out of Food in the Last Year: Never true  Transportation Needs: No Transportation Needs   Lack of Transportation (Medical): No   Lack of Transportation (Non-Medical): No  Physical Activity: Insufficiently Active   Days of Exercise per Week: 3 days   Minutes of Exercise per Session: 20 min  Stress: No Stress Concern Present   Feeling of Stress : Only a little  Social Connections: Socially Isolated   Frequency of Communication with Friends and  Family: More than three times a week   Frequency of Social Gatherings with Friends and Family: More than three times a week   Attends Religious Services: Never   Database administrator or Organizations: No   Attends Banker Meetings: Never   Marital Status: Never married   Additional Social History:    Allergies:   Allergies  Allergen Reactions   Penicillins Other (See Comments)    Reaction: "lockjaw" Has patient had a PCN reaction causing immediate rash, facial/tongue/throat swelling, SOB or lightheadedness with hypotension: No Has patient had a PCN reaction causing severe rash involving mucus membranes or skin necrosis: No Has patient had a PCN reaction that required hospitalization: No Has patient had a PCN reaction occurring within the last 10 years: No If all of the above answers are "NO", then may proceed with Cephalosporin use.     Labs:  Results for orders placed or performed during the hospital encounter of 06/02/21 (from the past 48 hour(s))  Comprehensive metabolic panel     Status: Abnormal   Collection Time: 06/01/21  8:18 PM  Result Value Ref Range   Sodium 140 135 - 145 mmol/L   Potassium 3.8 3.5 - 5.1 mmol/L   Chloride 110 98 - 111 mmol/L   CO2 22 22 - 32 mmol/L   Glucose, Bld 145 (H) 70 - 99 mg/dL    Comment: Glucose reference range applies only to samples taken after fasting for at least 8 hours.   BUN 16 6 - 20 mg/dL   Creatinine, Ser 1.61 0.61 - 1.24 mg/dL   Calcium 9.3 8.9 - 09.6 mg/dL   Total Protein 7.0 6.5 - 8.1 g/dL   Albumin 4.6 3.5 - 5.0 g/dL   AST 16 15 - 41 U/L   ALT 13 0 - 44 U/L   Alkaline Phosphatase 64 38 - 126 U/L   Total Bilirubin 0.5 0.3 - 1.2 mg/dL   GFR, Estimated >04 >54 mL/min    Comment: (NOTE) Calculated using the CKD-EPI Creatinine Equation (2021)    Anion gap 8 5 - 15    Comment: Performed at [facility], [address] [place], [place] [address]  Ethanol     Status: None   Collection Time: 06/01/21   8:18 PM  Result Value Ref Range   Alcohol, Ethyl (B) <10 <10 mg/dL    Comment: (NOTE) Lowest detectable limit for serum alcohol is 10 mg/dL.  For medical purposes only. Performed at [facility], [address] [place], [place] [address]   Salicylate level     Status: Abnormal   Collection Time: 06/01/21  8:18 PM  Result Value Ref Range   Salicylate Lvl <7.0 (L) 7.0 - 30.0 mg/dL    Comment: Performed at [facility] [place], [address], [place], [place] [address]  Acetaminophen level  Status: Abnormal   Collection Time: 06/01/21  8:18 PM  Result Value Ref Range   Acetaminophen (Tylenol), Serum <10 (L) 10 - 30 ug/mL    Comment: (NOTE) Therapeutic concentrations vary significantly. A range of 10-30 ug/mL  may be an effective concentration for many patients. However, some  are best treated at concentrations outside of this range. Acetaminophen concentrations >150 ug/mL at 4 hours after ingestion  and >50 ug/mL at 12 hours after ingestion are often associated with  toxic reactions.  Performed at [facility], [address] [place], [place] [address]   cbc     Status: None   Collection Time: 06/01/21  8:18 PM  Result Value Ref Range   WBC 4.3 4.0 - 10.5 K/uL   RBC 4.50 4.22 - 5.81 MIL/uL   Hemoglobin 14.1 13.0 - 17.0 g/dL   HCT 02.5 85.2 - 77.8 %   MCV 90.4 80.0 - 100.0 fL   MCH 31.3 26.0 - 34.0 pg   MCHC 34.6 30.0 - 36.0 g/dL   RDW 24.2 35.3 - 61.4 %   Platelets 214 150 - 400 K/uL   nRBC 0.0 0.0 - 0.2 %    Comment: Performed at [facility], [address], [place], [place] [address]  Urine Drug Screen, Qualitative     Status: Abnormal   Collection Time: 06/01/21  8:18 PM  Result Value Ref Range   Tricyclic, Ur Screen POSITIVE (A) NONE DETECTED   Amphetamines, Ur Screen NONE DETECTED NONE DETECTED   MDMA (Ecstasy)Ur Screen NONE DETECTED NONE DETECTED   Cocaine Metabolite,Ur [place] NONE DETECTED NONE DETECTED   Opiate, Ur Screen  NONE DETECTED NONE DETECTED   Phencyclidine (PCP) Ur S NONE DETECTED NONE DETECTED   Cannabinoid 50 Ng, Ur [place] NONE DETECTED NONE DETECTED   Barbiturates, Ur Screen NONE DETECTED NONE DETECTED   Benzodiazepine, Ur Scrn NONE DETECTED NONE DETECTED   Methadone Scn, Ur NONE DETECTED NONE DETECTED    Comment: (NOTE) Tricyclics + metabolites, urine    Cutoff 1000 ng/mL Amphetamines + metabolites, urine  Cutoff 1000 ng/mL MDMA (Ecstasy), urine              Cutoff 500 ng/mL Cocaine Metabolite, urine          Cutoff 300 ng/mL Opiate + metabolites, urine        Cutoff 300 ng/mL Phencyclidine (PCP), urine         Cutoff 25 ng/mL Cannabinoid, urine                 Cutoff 50 ng/mL Barbiturates + metabolites, urine  Cutoff 200 ng/mL Benzodiazepine, urine              Cutoff 200 ng/mL Methadone, urine                   Cutoff 300 ng/mL  The urine drug screen provides only a preliminary, unconfirmed analytical test result and should not be used for non-medical purposes. Clinical consideration and professional judgment should be applied to any positive drug screen result due to possible interfering substances. A more specific alternate chemical method must be used in order to obtain a confirmed analytical result. Gas chromatography / mass spectrometry (GC/MS) is the preferred confirm atory method. Performed at [facility], [address] [place], [place] [address]   Resp Panel by RT-PCR (Flu A&B, Covid) Nasopharyngeal Swab     Status: None   Collection Time: 06/01/21 11:55 PM   Specimen: Nasopharyngeal Swab;  Nasopharyngeal(NP) swabs in vial transport medium  Result Value Ref Range   SARS Coronavirus 2 by RT PCR NEGATIVE NEGATIVE    Comment: (NOTE) SARS-CoV-2 target nucleic acids are NOT DETECTED.  The SARS-CoV-2 RNA is generally detectable in upper respiratory specimens during the acute phase of infection. The lowest concentration of SARS-CoV-2 viral copies this assay can detect  is 138 copies/mL. A negative result does not preclude SARS-Cov-2 infection and should not be used as the sole basis for treatment or other patient management decisions. A negative result may occur with  improper specimen collection/handling, submission of specimen other than nasopharyngeal swab, presence of viral mutation(s) within the areas targeted by this assay, and inadequate number of viral copies(<138 copies/mL). A negative result must be combined with clinical observations, patient history, and epidemiological information. The expected result is Negative.  Fact Sheet for Patients:  BloggerCourse.com  Fact Sheet for Healthcare Providers:  SeriousBroker.it  This test is no t yet approved or cleared by the [place] FDA and  has been authorized for detection and/or diagnosis of SARS-CoV-2 by FDA under an Emergency Use Authorization (EUA). This EUA will remain  in effect (meaning this test can be used) for the duration of the COVID-19 declaration under Section 564(b)(1) of the Act, 21 U.S.C.section 360bbb-3(b)(1), unless the authorization is terminated  or revoked sooner.       Influenza A by PCR NEGATIVE NEGATIVE   Influenza B by PCR NEGATIVE NEGATIVE    Comment: (NOTE) The Xpert Xpress SARS-CoV-2/FLU/RSV plus assay is intended as an aid in the diagnosis of influenza from Nasopharyngeal swab specimens and should not be used as a sole basis for treatment. Nasal washings and aspirates are unacceptable for Xpert Xpress SARS-CoV-2/FLU/RSV testing.  Fact Sheet for Patients: BloggerCourse.com  Fact Sheet for Healthcare Providers: SeriousBroker.it  This test is not yet approved or cleared by the [place] FDA and has been authorized for detection and/or diagnosis of SARS-CoV-2 by FDA under an Emergency Use Authorization (EUA). This EUA will remain in effect (meaning this  test can be used) for the duration of the COVID-19 declaration under Section 564(b)(1) of the Act, 21 U.S.C. section 360bbb-3(b)(1), unless the authorization is terminated or revoked.  Performed at [facility], [address] [place], [place] [address]     No current facility-administered medications for this encounter.   Current Outpatient Medications  Medication Sig Dispense Refill   ADVAIR DISKUS 250-50 MCG/ACT AEPB Inhale 1 puff into the lungs 2 (two) times daily.     albuterol (VENTOLIN HFA) 108 (90 Base) MCG/ACT inhaler Inhale 1-2 puffs into the lungs every 6 (six) hours as needed for wheezing or shortness of breath. 18 g 1   cloZAPine (CLOZARIL) 100 MG tablet Take 5 tablets (500 mg total) by mouth at bedtime. 150 tablet 1   ELIQUIS 5 MG TABS tablet TAKE 1 TABLET BY MOUTH TWICE DAILY. 60 tablet 11   FORA Lancets MISC      glucose blood (FORA V30A BLOOD GLUCOSE TEST) test strip CHECK BLOOD SUGAR TWICE DAILY. 100 strip 0   hydrOXYzine (VISTARIL) 50 MG capsule Take 50 mg by mouth 2 (two) times daily as needed.     LEVEMIR FLEXTOUCH 100 UNIT/ML FlexTouch Pen INJECT 10 UNITS INTO THE SKIN TWICE DAILY. 3 mL 11   lisinopril (ZESTRIL) 2.5 MG tablet TAKE 1 TABLET EVERY MORNING FOR BLOOD PRESSURE. 30 tablet 11   lithium carbonate (ESKALITH) 450 MG CR tablet Take 1 tablet (450 mg total) by  mouth at bedtime. 30 tablet 1   lithium carbonate (LITHOBID) 300 MG CR tablet Take 1 tablet (300 mg total) by mouth in the morning. 30 tablet 1   metFORMIN (GLUCOPHAGE) 1000 MG tablet TAKE 1 TABLET BY MOUTH TWICE DAILY. 60 tablet 11   NOVOFINE AUTOCOVER PEN NEEDLE 30G X 8 MM MISC USE WITH LEVEMIR TWICE DAILY. 100 each 11   OLANZapine (ZYPREXA) 20 MG tablet Take 1 tablet (20 mg total) by mouth at bedtime. 30 tablet 1   Omega-3 Fatty Acids (FISH OIL) 1000 MG CAPS TAKE 1 CAPSULE BY MOUTH ONCE A DAY. 30 capsule 11   propranolol (INDERAL) 20 MG tablet Take 1 tablet (20 mg total) by mouth at bedtime.  30 tablet 1   sertraline (ZOLOFT) 100 MG tablet Take 100 mg by mouth daily.     simvastatin (ZOCOR) 40 MG tablet TAKE 1 TABLET BY MOUTH AT 6:00 P.M. 30 tablet 11   traZODone (DESYREL) 100 MG tablet Take 1 tablet (100 mg total) by mouth at bedtime. 30 tablet 1    Musculoskeletal: Strength & Muscle Tone: within normal limits Gait & Station: normal Patient leans: N/A  Psychiatric Specialty Exam:  Presentation  General Appearance: Appropriate for Environment  Eye Contact:Good  Speech:Clear and Coherent  Speech Volume:Normal  Handedness:Right   Mood and Affect  Mood:Depressed  Affect:Blunt   Thought Process  Thought Processes:Coherent  Descriptions of Associations:Intact  Orientation:Full (Time, Place and Person)  Thought Content:WDL  History of Schizophrenia/Schizoaffective disorder:Yes  Duration of Psychotic Symptoms:Less than six months  Hallucinations:Hallucinations: None  Ideas of Reference:None  Suicidal Thoughts:Suicidal Thoughts: Yes, Passive SI Passive Intent and/or Plan: Without Intent; Without Means to Carry Out  Homicidal Thoughts:Homicidal Thoughts: No   Sensorium  Memory:Immediate Good  Judgment:Fair  Insight:Poor   Executive Functions  Concentration:Good  Attention Span:Good  Recall:Fair  Fund of Knowledge:Poor  Language:Fair   Psychomotor Activity  Psychomotor Activity:Psychomotor Activity: Normal   Assets  Assets:Housing; Financial Resources/Insurance; Resilience; Social Support   Sleep  Sleep:Sleep: Good   Physical Exam: Physical Exam Vitals and nursing note reviewed.  HENT:     Head: Normocephalic.     Nose: No congestion or rhinorrhea.  Eyes:     General:        Right eye: No discharge.        Left eye: No discharge.  Cardiovascular:     Rate and Rhythm: Normal rate.  Pulmonary:     Effort: Pulmonary effort is normal.  Musculoskeletal:        General: Normal range of motion.     Cervical back: Normal  range of motion.  Skin:    General: Skin is dry.  Neurological:     Mental Status: He is alert and oriented to person, place, and time.  Psychiatric:        Attention and Perception: Attention normal.        Mood and Affect: Affect is blunt.        Speech: Speech normal.        Behavior: Behavior normal. Behavior is cooperative.        Thought Content: Thought content includes suicidal (passive) ideation.        Cognition and Memory: Cognition is impaired.        Judgment: Judgment is impulsive.   Review of Systems  Psychiatric/Behavioral:  Positive for depression (stable) and suicidal ideas (chronic/passive). Negative for hallucinations, memory loss and substance abuse. The patient is not nervous/anxious and does not have insomnia.  All other systems reviewed and are negative. Blood pressure 118/88, pulse 87, temperature 98.3 F (36.8 C), resp. rate 17, height  (1.778 m), weight 83.9 kg, SpO2 97 %. Body mass index is 26.54 kg/m.  Treatment Plan Summary: Plan 45 year old male with history of schizoaffective disorder presents to the ED with vague complaints of suicidal thoughts.  Patient frequently presents with these thoughts and it is time to his group home placement, feeling that he is disrespected there.  Patient does not meet criteria for inpatient hospitalization.  Reviewed with EDP  Disposition: No evidence of imminent risk to self or others at present.   Patient does not meet criteria for psychiatric inpatient admission. Supportive therapy provided about ongoing stressors. Discussed crisis plan, support from social network, calling 911, coming to the Emergency Department, and calling Suicide Hotline.  Vanetta Mulders, NP 06/02/2021 9:08 AM

## 2021-06-02 NOTE — ED Notes (Signed)
Attempted to called [facility] and legal guardian. No response for either.

## 2021-06-02 NOTE — ED Notes (Addendum)
Spoke with someone at [facility], states that someone will be here within the hour to pick him up.

## 2021-06-02 NOTE — ED Provider Notes (Signed)
[facility] Emergency Department Provider Note   ____________________________________________   Event Date/Time   First MD Initiated Contact with Patient 06/02/21 0002     (approximate)  I have reviewed the triage vital signs and the nursing notes.   HISTORY  Chief Complaint Suicidal    HPI Lance [name] is a 45 y.o. male who presents to the ED via [employer] voluntarily with a chief complaint of SI and HI.  Patient has a history of schizophrenia reports hearing voices telling him to do things harm himself.  Currently denies active SI/HI/AH/VH.  Voices no medical complaints.     Past Medical History:  Diagnosis Date   Depression    Diabetes mellitus without complication (HCC)    Hyperlipidemia    Hypertension    Lupus anticoagulant disorder (HCC) 06/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   PE (pulmonary thromboembolism) (HCC)    Schizo affective schizophrenia (HCC)    Supratherapeutic INR 11/19/2016    Patient Active Problem List   Diagnosis Date Noted   Homicidal thoughts    Suicidal ideation    Alcohol abuse 04/12/2018   Tobacco use disorder 09/01/2016   Schizoaffective disorder, bipolar type (HCC) 05/22/2016   Diabetes mellitus without complication (HCC) 05/21/2016   Hyperlipidemia 05/21/2016   History of pulmonary embolism 05/21/2016   Chronic anticoagulation 05/21/2016   Hypertension 09/25/2015    History reviewed. No pertinent surgical history.  Prior to Admission medications   Medication Sig Start Date End Date Taking? Authorizing Provider  ADVAIR DISKUS 250-50 MCG/ACT AEPB Inhale 1 puff into the lungs 2 (two) times daily. 10/24/20   [provider]  albuterol (VENTOLIN HFA) 108 (90 Base) MCG/ACT inhaler Inhale 1-2 puffs into the lungs every 6 (six) hours as needed for wheezing or shortness of breath. 11/14/19   Clapacs, [name], MD  cloZAPine (CLOZARIL) 100 MG tablet Take 5 tablets (500 mg total) by mouth at bedtime.  11/18/20   [name], MD  ELIQUIS 5 MG TABS tablet TAKE 1 TABLET BY MOUTH TWICE DAILY. 12/27/20   Corky Downs, MD  FORA Lancets MISC  04/21/21   [provider]  glucose blood (FORA V30A BLOOD GLUCOSE TEST) test strip CHECK BLOOD SUGAR TWICE DAILY. 04/18/21   Corky Downs, MD  hydrOXYzine (VISTARIL) 50 MG capsule Take 50 mg by mouth 2 (two) times daily as needed. 01/21/21   [provider]  LEVEMIR FLEXTOUCH 100 UNIT/ML FlexTouch Pen INJECT 10 UNITS INTO THE SKIN TWICE DAILY. 03/28/21   Corky Downs, MD  lisinopril (ZESTRIL) 2.5 MG tablet TAKE 1 TABLET EVERY MORNING FOR BLOOD PRESSURE. 12/27/20   Corky Downs, MD  lithium carbonate (ESKALITH) 450 MG CR tablet Take 1 tablet (450 mg total) by mouth at bedtime. 11/18/20   [name], MD  lithium carbonate (LITHOBID) 300 MG CR tablet Take 1 tablet (300 mg total) by mouth in the morning. 11/19/20   [name], MD  metFORMIN (GLUCOPHAGE) 1000 MG tablet TAKE 1 TABLET BY MOUTH TWICE DAILY. 12/24/20   Corky Downs, MD  NOVOFINE AUTOCOVER PEN NEEDLE 30G X 8 MM MISC USE WITH LEVEMIR TWICE DAILY. 12/24/20   Corky Downs, MD  OLANZapine (ZYPREXA) 20 MG tablet Take 1 tablet (20 mg total) by mouth at bedtime. 11/18/20   [name], MD  Omega-3 Fatty Acids (FISH OIL) 1000 MG CAPS TAKE 1 CAPSULE BY MOUTH ONCE A DAY. 12/27/20   Corky Downs, MD  propranolol (INDERAL) 20  MG tablet Take 1 tablet (20 mg total) by mouth at bedtime. 11/18/20   [name], MD  sertraline (ZOLOFT) 100 MG tablet Take 100 mg by mouth daily. 01/21/21   [provider]  simvastatin (ZOCOR) 40 MG tablet TAKE 1 TABLET BY MOUTH AT 6:00 P.M. 03/28/21   Corky Downs, MD  traZODone (DESYREL) 100 MG tablet Take 1 tablet (100 mg total) by mouth at bedtime. 11/18/20   [name], MD    Allergies Penicillins  Family History  Problem Relation Age of Onset   CAD Mother    CAD Sister     Social History Social History   Tobacco Use   Smoking  status: Every Day    Packs/day: 1.00    Years: 23.00    Pack years: 23.00    Types: Cigarettes   Smokeless tobacco: Never   Tobacco comments:    will provide material  Substance Use Topics   Alcohol use: No    Comment: occassionally   Drug use: Not Currently    Types: Marijuana    Comment: Last use 11/29/16    Review of Systems  Constitutional: No fever/chills Eyes: No visual changes. ENT: No sore throat. Cardiovascular: Denies chest pain. Respiratory: Denies shortness of breath. Gastrointestinal: No abdominal pain.  No nausea, no vomiting.  No diarrhea.  No constipation. Genitourinary: Negative for dysuria. Musculoskeletal: Negative for back pain. Skin: Negative for rash. Neurological: Negative for headaches, focal weakness or numbness. Psychiatric: Positive for SI/HI.  ____________________________________________   PHYSICAL EXAM:  VITAL SIGNS: ED Triage Vitals [06/01/21 2015]  Enc Vitals Group     BP (!) 120/98     Pulse Rate 84     Resp 18     Temp 98.3 F (36.8 C)     Temp src      SpO2 95 %     Weight 185 lb (83.9 kg)     Height 5\' 10"  (1.778 m)     Head Circumference      Peak Flow      Pain Score 0     Pain Loc      Pain Edu?      Excl. in GC?     Constitutional: Alert and oriented.  Disheveled appearing and in no acute distress. Eyes: Conjunctivae are normal. PERRL. EOMI. Head: Atraumatic. Nose: No congestion/rhinnorhea. Mouth/Throat: Mucous membranes are moist.   Neck: No stridor.   Cardiovascular: Normal rate, regular rhythm. Grossly normal heart sounds.  Good peripheral circulation. Respiratory: Normal respiratory effort.  No retractions. Lungs CTAB. Gastrointestinal: Soft and nontender. No distention. No abdominal bruits. No CVA tenderness. Musculoskeletal: No lower extremity tenderness nor edema.  No joint effusions. Neurologic:  Normal speech and language. No gross focal neurologic deficits are appreciated. No gait instability. Skin:   Skin is warm, dry and intact. No rash noted. Psychiatric: Mood and affect are flat. Speech and behavior are normal.  ____________________________________________   LABS (all labs ordered are listed, but only abnormal results are displayed)  Labs Reviewed  COMPREHENSIVE METABOLIC PANEL - Abnormal; Notable for the following components:      Result Value   Glucose, Bld 145 (*)    All other components within normal limits  SALICYLATE LEVEL - Abnormal; Notable for the following components:   Salicylate Lvl <7.0 (*)    All other components within normal limits  ACETAMINOPHEN LEVEL - Abnormal; Notable for the following components:   Acetaminophen (Tylenol), Serum <10 (*)    All other  components within normal limits  URINE DRUG SCREEN, QUALITATIVE ([facility] ONLY) - Abnormal; Notable for the following components:   Tricyclic, Ur Screen POSITIVE (*)    All other components within normal limits  RESP PANEL BY RT-PCR (FLU A&B, COVID) ARPGX2  ETHANOL  CBC   ____________________________________________  EKG  None ____________________________________________  RADIOLOGY I, Bekim Werntz J, personally viewed and evaluated these images (plain radiographs) as part of my medical decision making, as well as reviewing the written report by the radiologist.  ED MD interpretation: None  Official radiology report(s): No results found.  ____________________________________________   PROCEDURES  Procedure(s) performed (including Critical Care):  Procedures   ____________________________________________   INITIAL IMPRESSION / ASSESSMENT AND PLAN / ED COURSE  As part of my medical decision making, I reviewed the following data within the electronic MEDICAL RECORD NUMBER Nursing notes reviewed and incorporated, Labs reviewed, Old chart reviewed, A consult was requested and obtained from this/these consultant(s) Psychiatry, and Notes from prior ED visits     45 year old schizophrenic presenting  voluntarily for SI/HI.  Contracts for safety while in the emergency department. The patient has been placed in psychiatric observation due to the need to provide a safe environment for the patient while obtaining psychiatric consultation and evaluation, as well as ongoing medical and medication management to treat the patient's condition.  The patient has not been placed under full IVC at this time.       ____________________________________________   FINAL CLINICAL IMPRESSION(S) / ED DIAGNOSES  Final diagnoses:  Schizophrenia, unspecified type [facility])     ED Discharge Orders     None        Note:  This document was prepared using Dragon voice recognition software and may include unintentional dictation errors.    Irean Hong, MD 06/02/21 [phone]

## 2021-06-02 NOTE — ED Notes (Signed)
Officer from [employer] called this RN stating that there was someone at the group home named Maisie Fus. Officer states that pt walked away from the Group Home last night.

## 2021-06-02 NOTE — ED Notes (Addendum)
Spoke with Officer Jamelle Haring to see if we could possibly do a wellness check for the group home at this time.

## 2021-06-03 ENCOUNTER — Ambulatory Visit: Payer: Medicare Other | Admitting: Internal Medicine

## 2021-06-12 ENCOUNTER — Ambulatory Visit: Payer: Medicare Other | Admitting: Podiatry

## 2021-07-01 ENCOUNTER — Other Ambulatory Visit: Payer: Self-pay | Admitting: *Deleted

## 2021-07-01 MED ORDER — BLOOD GLUCOSE METER KIT: PACK | 6 refills | Status: DC

## 2021-07-03 ENCOUNTER — Ambulatory Visit: Payer: Medicare Other | Admitting: Podiatry

## 2021-07-21 ENCOUNTER — Encounter: Payer: Self-pay | Admitting: *Deleted

## 2021-07-21 ENCOUNTER — Other Ambulatory Visit: Payer: Self-pay

## 2021-07-21 ENCOUNTER — Emergency Department
Admission: EM | Admit: 2021-07-21 | Discharge: 2021-07-22 | Disposition: A | Discharge status: Home / Self Care | Payer: Medicare Other | Attending: Emergency Medicine | Admitting: Emergency Medicine

## 2021-07-21 DIAGNOSIS — R5383 Other fatigue: Secondary | ICD-10-CM | POA: Diagnosis present

## 2021-07-21 DIAGNOSIS — F17200 Nicotine dependence, unspecified, uncomplicated: Secondary | ICD-10-CM | POA: Diagnosis not present

## 2021-07-21 DIAGNOSIS — Z20822 Contact with and (suspected) exposure to covid-19: Secondary | ICD-10-CM | POA: Insufficient documentation

## 2021-07-21 DIAGNOSIS — F250 Schizoaffective disorder, bipolar type: Secondary | ICD-10-CM | POA: Diagnosis not present

## 2021-07-21 DIAGNOSIS — R45851 Suicidal ideations: Secondary | ICD-10-CM | POA: Diagnosis not present

## 2021-07-21 DIAGNOSIS — R45850 Homicidal ideations: Secondary | ICD-10-CM

## 2021-07-21 DIAGNOSIS — E119 Type 2 diabetes mellitus without complications: Secondary | ICD-10-CM

## 2021-07-21 DIAGNOSIS — F319 Bipolar disorder, unspecified: Secondary | ICD-10-CM | POA: Diagnosis present

## 2021-07-21 LAB — CBC
HCT: 40.9 % (ref 39.0–52.0)
Hemoglobin: 14 g/dL (ref 13.0–17.0)
MCH: 31.3 pg (ref 26.0–34.0)
MCHC: 34.2 g/dL (ref 30.0–36.0)
MCV: 91.3 fL (ref 80.0–100.0)
Platelets: 227 10*3/uL (ref 150–400)
RBC: 4.48 MIL/uL (ref 4.22–5.81)
RDW: 12.5 % (ref 11.5–15.5)
WBC: 5.1 10*3/uL (ref 4.0–10.5)
nRBC: 0 % (ref 0.0–0.2)

## 2021-07-21 LAB — COMPREHENSIVE METABOLIC PANEL
ALT: 12 U/L (ref 0–44)
AST: 20 U/L (ref 15–41)
Albumin: 4.4 g/dL (ref 3.5–5.0)
Alkaline Phosphatase: 71 U/L (ref 38–126)
Anion gap: 7 (ref 5–15)
BUN: 22 mg/dL — ABNORMAL HIGH (ref 6–20)
CO2: 22 mmol/L (ref 22–32)
Calcium: 9.9 mg/dL (ref 8.9–10.3)
Chloride: 111 mmol/L (ref 98–111)
Creatinine, Ser: 1.04 mg/dL (ref 0.61–1.24)
GFR, Estimated: >60 mL/min (ref >60)
Glucose, Bld: 104 mg/dL — ABNORMAL HIGH (ref 70–99)
Potassium: 4.1 mmol/L (ref 3.5–5.1)
Sodium: 140 mmol/L (ref 135–145)
Total Bilirubin: 0.6 mg/dL (ref 0.3–1.2)
Total Protein: 7.2 g/dL (ref 6.5–8.1)

## 2021-07-21 LAB — SALICYLATE LEVEL: Salicylate Lvl: <7 mg/dL — ABNORMAL LOW (ref 7.0–30.0)

## 2021-07-21 LAB — ETHANOL: Alcohol, Ethyl (B): <10 mg/dL (ref <10)

## 2021-07-21 LAB — URINE DRUG SCREEN, QUALITATIVE (ARMC ONLY)
Amphetamines, Ur Screen: NOT DETECTED
Barbiturates, Ur Screen: NOT DETECTED
Benzodiazepine, Ur Scrn: NOT DETECTED
Cannabinoid 50 Ng, Ur ~~LOC~~: NOT DETECTED
Cocaine Metabolite,Ur ~~LOC~~: NOT DETECTED
MDMA (Ecstasy)Ur Screen: NOT DETECTED
Methadone Scn, Ur: NOT DETECTED
Opiate, Ur Screen: NOT DETECTED
Phencyclidine (PCP) Ur S: NOT DETECTED
Tricyclic, Ur Screen: POSITIVE — AB

## 2021-07-21 LAB — RESP PANEL BY RT-PCR (FLU A&B, COVID) ARPGX2
Influenza A by PCR: NEGATIVE
Influenza B by PCR: NEGATIVE
SARS Coronavirus 2 by RT PCR: NEGATIVE

## 2021-07-21 LAB — ACETAMINOPHEN LEVEL: Acetaminophen (Tylenol), Serum: <10 ug/mL — ABNORMAL LOW (ref 10–30)

## 2021-07-21 NOTE — ED Triage Notes (Signed)
Pt left the group home and was brought in by [employer] officer.  Pt reports not getting along with staff.  Pt reports thoughts of hurting others.  Pt calm and cooperative.  Pt denies etoh use.  Pt smokes marijuana.

## 2021-07-21 NOTE — ED Provider Notes (Signed)
[facility] Provider Note    Event Date/Time   First MD Initiated Contact with Patient 07/21/21 2148     (approximate)   History   Psychiatric Evaluation   HPI  Lance [name] is a 46 y.o. male with schizophrenia who left the group home was brought in by police.  Patient reports not getting along with staff.  He reports some HI to the people he does not like there.  He also reports some SI but denies any active plan.  He reports just feeling fatigued.  Denies any hitting his head.  Denies any EtOH use.  He states that he sometimes takes his medications but sometimes the group home does not give them all to him.  Physical Exam   Triage Vital Signs: ED Triage Vitals [07/21/21 2114]  Enc Vitals Group     BP      Pulse      Resp      Temp      Temp src      SpO2      Weight 185 lb (83.9 kg)     Height 5\' 10"  (1.778 m)     Head Circumference      Peak Flow      Pain Score 0     Pain Loc      Pain Edu?      Excl. in GC?     Most recent vital signs: There were no vitals filed for this visit.   General: Awake, no distress.  CV:  Good peripheral perfusion.  Resp:  Normal effort.  Abd:  No distention.  Other:  Psych: Reporting SI, HI  ED Results / Procedures / Treatments   Labs (all labs ordered are listed, but only abnormal results are displayed) Labs Reviewed  COMPREHENSIVE METABOLIC PANEL - Abnormal; Notable for the following components:      Result Value   Glucose, Bld 104 (*)    BUN 22 (*)    All other components within normal limits  RESP PANEL BY RT-PCR (FLU A&B, COVID) ARPGX2  CBC  ETHANOL  SALICYLATE LEVEL  ACETAMINOPHEN LEVEL  URINE DRUG SCREEN, QUALITATIVE ([facility] ONLY)    MEDICATIONS ORDERED IN ED: Medications - No data to display   IMPRESSION / MDM / ASSESSMENT AND PLAN / ED COURSE  I reviewed the triage vital signs and the nursing notes.                              Pt is without any acute medical complaints. No  exam findings to suggest medical cause of current presentation. Will order psychiatric screening labs and discuss further w/ psychiatric service.  D/d includes but is not limited to psychiatric disease, behavioral/personality disorder, inadequate socioeconomic support, medical.  COVID, flu are negative CMP normal EtOH negative Salicylate negative Tylenol negative CBC negative  Based on HPI, exam, unremarkable labs, no concern for acute medical problem at this time. No rigidity, clonus, hyperthermia, focal neurologic deficit, diaphoresis, tachycardia, meningismus, ataxia, gait abnormality or other finding to suggest this visit represents a non-psychiatric problem. Screening labs reviewed.    Given this, pt medically cleared, to be dispositioned per Psych.    The patient has been placed in psychiatric observation due to the need to provide a safe environment for the patient while obtaining psychiatric consultation and evaluation, as well as ongoing medical and medication management to treat the patient's condition.  The  patient has not been placed under full IVC at this time.      FINAL CLINICAL IMPRESSION(S) / ED DIAGNOSES   Final diagnoses:  Suicidal ideation     Rx / DC Orders   ED Discharge Orders     None        Note:  This document was prepared using Dragon voice recognition software and may include unintentional dictation errors.   Concha Se, MD 07/21/21 2257

## 2021-07-21 NOTE — ED Notes (Signed)
Pt. Alert and oriented, warm and dry, in no distress. Pt. Denies SI, and AVH. Pt states he does not feel safe at the group home with staff and him toward the staff. Pt states he has been seeing lights flickering at the group home but currently not seeing anything. Pt. Encouraged to let nursing staff know of any concerns or needs.

## 2021-07-21 NOTE — ED Notes (Signed)
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

## 2021-07-22 DIAGNOSIS — F250 Schizoaffective disorder, bipolar type: Secondary | ICD-10-CM | POA: Diagnosis not present

## 2021-07-22 LAB — CBC WITH DIFFERENTIAL/PLATELET
Abs Immature Granulocytes: 0.02 10*3/uL (ref 0.00–0.07)
Basophils Absolute: 0 10*3/uL (ref 0.0–0.1)
Basophils Relative: 0 %
Eosinophils Absolute: 0 10*3/uL (ref 0.0–0.5)
Eosinophils Relative: 0 %
HCT: 41.1 % (ref 39.0–52.0)
Hemoglobin: 13.9 g/dL (ref 13.0–17.0)
Immature Granulocytes: 0 %
Lymphocytes Relative: 30 %
Lymphs Abs: 1.5 10*3/uL (ref 0.7–4.0)
MCH: 30.6 pg (ref 26.0–34.0)
MCHC: 33.8 g/dL (ref 30.0–36.0)
MCV: 90.5 fL (ref 80.0–100.0)
Monocytes Absolute: 0.6 10*3/uL (ref 0.1–1.0)
Monocytes Relative: 12 %
Neutro Abs: 3 10*3/uL (ref 1.7–7.7)
Neutrophils Relative %: 58 %
Platelets: 255 10*3/uL (ref 150–400)
RBC: 4.54 MIL/uL (ref 4.22–5.81)
RDW: 12.6 % (ref 11.5–15.5)
WBC: 5.1 10*3/uL (ref 4.0–10.5)
nRBC: 0 % (ref 0.0–0.2)

## 2021-07-22 LAB — CBG MONITORING, ED
Glucose-Capillary: 228 mg/dL — ABNORMAL HIGH (ref 70–99)
Glucose-Capillary: 141 mg/dL — ABNORMAL HIGH (ref 70–99)

## 2021-07-22 MED ORDER — CLOZAPINE 100 MG PO TABS: 500.0000 mg | ORAL_TABLET | Freq: Every day | ORAL | Status: DC

## 2021-07-22 MED ORDER — NICOTINE 21 MG/24HR TD PT24
21.0000 mg | MEDICATED_PATCH | Freq: Once | TRANSDERMAL | Status: DC
  Administered 2021-07-22: 21 mg via TRANSDERMAL
  Filled 2021-07-22: qty 1

## 2021-07-22 MED ORDER — ALBUTEROL SULFATE (2.5 MG/3ML) 0.083% IN NEBU: 3.0000 mL | INHALATION_SOLUTION | Freq: Four times a day (QID) | RESPIRATORY_TRACT | Status: DC | PRN

## 2021-07-22 MED ORDER — LITHIUM CARBONATE ER 450 MG PO TBCR
450.0000 mg | EXTENDED_RELEASE_TABLET | Freq: Every day | ORAL | Status: DC
  Filled 2021-07-22: qty 1

## 2021-07-22 MED ORDER — LITHIUM CARBONATE ER 300 MG PO TBCR
300.0000 mg | EXTENDED_RELEASE_TABLET | Freq: Every morning | ORAL | Status: DC
  Filled 2021-07-22 (×2): qty 1

## 2021-07-22 MED ORDER — LISINOPRIL 5 MG PO TABS
2.5000 mg | ORAL_TABLET | Freq: Every day | ORAL | Status: DC
  Filled 2021-07-22: qty 1

## 2021-07-22 MED ORDER — MAGNESIUM HYDROXIDE 400 MG/5ML PO SUSP: 30.0000 mL | Freq: Every day | ORAL | Status: DC | PRN

## 2021-07-22 MED ORDER — APIXABAN 5 MG PO TABS: 5.0000 mg | ORAL_TABLET | Freq: Two times a day (BID) | ORAL | Status: DC

## 2021-07-22 MED ORDER — OLANZAPINE 10 MG PO TABS: 20.0000 mg | ORAL_TABLET | Freq: Every day | ORAL | Status: DC

## 2021-07-22 MED ORDER — SERTRALINE HCL 50 MG PO TABS
100.0000 mg | ORAL_TABLET | Freq: Every day | ORAL | Status: DC
  Filled 2021-07-22: qty 2

## 2021-07-22 MED ORDER — TRAZODONE HCL 100 MG PO TABS: 100.0000 mg | ORAL_TABLET | Freq: Every day | ORAL | Status: DC

## 2021-07-22 MED ORDER — ACETAMINOPHEN 325 MG PO TABS
650.0000 mg | ORAL_TABLET | Freq: Three times a day (TID) | ORAL | Status: DC | PRN
  Filled 2021-07-22: qty 2

## 2021-07-22 MED ORDER — HYDROXYZINE PAMOATE 50 MG PO CAPS
100.0000 mg | ORAL_CAPSULE | Freq: Every day | ORAL | Status: DC
  Filled 2021-07-22: qty 2

## 2021-07-22 MED ORDER — SIMVASTATIN 10 MG PO TABS
40.0000 mg | ORAL_TABLET | Freq: Every day | ORAL | Status: DC
  Administered 2021-07-22: 40 mg via ORAL
  Filled 2021-07-22: qty 4

## 2021-07-22 MED ORDER — ALUM & MAG HYDROXIDE-SIMETH 200-200-20 MG/5ML PO SUSP: 30.0000 mL | Freq: Every day | ORAL | Status: DC | PRN

## 2021-07-22 NOTE — Consult Note (Signed)
[facility] Face-to-Face Psychiatry Consult   Reason for Consult:  not happy at group home Referring Physician:  EDP Patient Identification: Lance [name] MRN:  [id number] Principal Diagnosis: Schizoaffective disorder, bipolar type ([place]) Diagnosis:  Principal Problem:   Schizoaffective disorder, bipolar type ([place]) Active Problems:   Diabetes mellitus without complication ([place])   Tobacco use disorder   Suicidal ideation   Homicidal thoughts   Total Time spent with patient: 30 minutes  Subjective:  "They don't like me in the group home." Lance [name] is a 46 y.o. male patient admitted with complaints of not liking group home.  HPI:  Patient seen and chart reviewed. Patient was brought in last night because he is unhappy at his group home. Patient presents with similar accounts on previous presentations to ED.  On evaluation this morning, patient makes good eye contact. He reports feeling "unsafe" at the group home, but denies that anyone there has ever done anything to harm him. He states "they don't like me."  He is unable to articulate why he "feels unsafe." He denies overt SI/HI/. Denies AVH and does not appear to be responding to internal stimuli. Patient states he "came here for help" and he wants some time away from group home. Discussed with patient that if he is unhappy with group home placement he needs to discuss with his guardian; the ED is not an appropriate place to come for that. Patient states "then just send me back." He is irritable, but does not appear to be in any imminent danger. Staff is trying to get in touch with group home.   Does not meet criteria for inpatient psychiatric hospitalization at this time.   Past Psychiatric History: Schizophrenia disorder; Frequently presents to ED with similar complaints Past history of NSSIB. Minimal past history of aggression  Risk to Self:   Risk to Others:   Prior Inpatient Therapy:   Prior Outpatient Therapy:    Past Medical  History:  Past Medical History:  Diagnosis Date   Depression    Diabetes mellitus without complication ([place])    Hyperlipidemia    Hypertension    Lupus anticoagulant disorder ([place]) 06/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   PE (pulmonary thromboembolism) ([place])    Schizo affective schizophrenia ([place])    Supratherapeutic INR 11/19/2016   No past surgical history on file. Family History:  Family History  Problem Relation Age of Onset   CAD Mother    CAD Sister    Family Psychiatric  History: none reported Social History:  Social History   Substance and Sexual Activity  Alcohol Use No   Comment: occassionally     Social History   Substance and Sexual Activity  Drug Use Not Currently   Types: Marijuana   Comment: Last use 11/29/16    Social History   Socioeconomic History   Marital status: Single    Spouse name: Not on file   Number of children: Not on file   Years of education: Not on file   Highest education level: Not on file  Occupational History   Not on file  Tobacco Use   Smoking status: Every Day    Packs/day: 1.00    Years: 23.00    Pack years: 23.00    Types: Cigarettes   Smokeless tobacco: Never   Tobacco comments:    will provide material  Substance and Sexual Activity   Alcohol use: No    Comment: occassionally   Drug use: Not  Currently    Types: Marijuana    Comment: Last use 11/29/16   Sexual activity: Never    Birth control/protection: None    Comment: occasional marijuana- none recently  Other Topics Concern   Not on file  Social History Narrative   From a group home in [place] Resource Strain: Low Risk    Difficulty of Paying Living Expenses: Not very hard  Food Insecurity: No Food Insecurity   Worried About Charity fundraiser in the Last Year: Never true   Ran Out of Food in the Last Year: Never true  Transportation Needs: No Transportation Needs   Lack of Transportation  (Medical): No   Lack of Transportation (Non-Medical): No  Physical Activity: Insufficiently Active   Days of Exercise per Week: 3 days   Minutes of Exercise per Session: 20 min  Stress: No Stress Concern Present   Feeling of Stress : Only a little  Social Connections: Socially Isolated   Frequency of Communication with Friends and Family: More than three times a week   Frequency of Social Gatherings with Friends and Family: More than three times a week   Attends Religious Services: Never   Marine scientist or Organizations: No   Attends Archivist Meetings: Never   Marital Status: Never married   Additional Social History:    Allergies:   Allergies  Allergen Reactions   Penicillins Other (See Comments)    Reaction: "lockjaw" Has patient had a PCN reaction causing immediate rash, facial/tongue/throat swelling, SOB or lightheadedness with hypotension: No Has patient had a PCN reaction causing severe rash involving mucus membranes or skin necrosis: No Has patient had a PCN reaction that required hospitalization: No Has patient had a PCN reaction occurring within the last 10 years: No If all of the above answers are "NO", then may proceed with Cephalosporin use.     Labs:  Results for orders placed or performed during the hospital encounter of 07/21/21 (from the past 48 hour(s))  Comprehensive metabolic panel     Status: Abnormal   Collection Time: 07/21/21  9:18 PM  Result Value Ref Range   Sodium 140 135 - 145 mmol/L   Potassium 4.1 3.5 - 5.1 mmol/L   Chloride 111 98 - 111 mmol/L   CO2 22 22 - 32 mmol/L   Glucose, Bld 104 (H) 70 - 99 mg/dL    Comment: Glucose reference range applies only to samples taken after fasting for at least 8 hours.   BUN 22 (H) 6 - 20 mg/dL   Creatinine, Ser 1.04 0.61 - 1.24 mg/dL   Calcium 9.9 8.9 - 10.3 mg/dL   Total Protein 7.2 6.5 - 8.1 g/dL   Albumin 4.4 3.5 - 5.0 g/dL   AST 20 15 - 41 U/L   ALT 12 0 - 44 U/L   Alkaline  Phosphatase 71 38 - 126 U/L   Total Bilirubin 0.6 0.3 - 1.2 mg/dL   GFR, Estimated >60 >60 mL/min    Comment: (NOTE) Calculated using the CKD-EPI Creatinine Equation (2021)    Anion gap 7 5 - 15    Comment: Performed at [facility], [address] [place], [place] [address]  Ethanol     Status: None   Collection Time: 07/21/21  9:18 PM  Result Value Ref Range   Alcohol, Ethyl (B) <10 <10 mg/dL    Comment: (NOTE) Lowest detectable limit for serum alcohol is 10  mg/dL.  For medical purposes only. Performed at [facility], [address] [place], [place] [address]   Salicylate level     Status: Abnormal   Collection Time: 07/21/21  9:18 PM  Result Value Ref Range   Salicylate Lvl <4.0 (L) 7.0 - 30.0 mg/dL    Comment: Performed at [facility], [address] [place], [place] [address]  Acetaminophen level     Status: Abnormal   Collection Time: 07/21/21  9:18 PM  Result Value Ref Range   Acetaminophen (Tylenol), Serum <10 (L) 10 - 30 ug/mL    Comment: (NOTE) Therapeutic concentrations vary significantly. A range of 10-30 ug/mL  may be an effective concentration for many patients. However, some  are best treated at concentrations outside of this range. Acetaminophen concentrations >150 ug/mL at 4 hours after ingestion  and >50 ug/mL at 12 hours after ingestion are often associated with  toxic reactions.  Performed at [facility], [address] [place], [place] [address]   cbc     Status: None   Collection Time: 07/21/21  9:18 PM  Result Value Ref Range   WBC 5.1 4.0 - 10.5 K/uL   RBC 4.48 4.22 - 5.81 MIL/uL   Hemoglobin 14.0 13.0 - 17.0 g/dL   HCT 40.9 39.0 - 52.0 %   MCV 91.3 80.0 - 100.0 fL   MCH 31.3 26.0 - 34.0 pg   MCHC 34.2 30.0 - 36.0 g/dL   RDW 12.5 11.5 - 15.5 %   Platelets 227 150 - 400 K/uL   nRBC 0.0 0.0 - 0.2 %    Comment: Performed at [facility], [address] [place], [place] [address]  Urine  Drug Screen, Qualitative     Status: Abnormal   Collection Time: 07/21/21  9:18 PM  Result Value Ref Range   Tricyclic, Ur Screen POSITIVE (A) NONE DETECTED   Amphetamines, Ur Screen NONE DETECTED NONE DETECTED   MDMA (Ecstasy)Ur Screen NONE DETECTED NONE DETECTED   Cocaine Metabolite,Ur [place] NONE DETECTED NONE DETECTED   Opiate, Ur Screen NONE DETECTED NONE DETECTED   Phencyclidine (PCP) Ur S NONE DETECTED NONE DETECTED   Cannabinoid 50 Ng, Ur [place] NONE DETECTED NONE DETECTED   Barbiturates, Ur Screen NONE DETECTED NONE DETECTED   Benzodiazepine, Ur Scrn NONE DETECTED NONE DETECTED   Methadone Scn, Ur NONE DETECTED NONE DETECTED    Comment: (NOTE) Tricyclics + metabolites, urine    Cutoff 1000 ng/mL Amphetamines + metabolites, urine  Cutoff 1000 ng/mL MDMA (Ecstasy), urine              Cutoff 500 ng/mL Cocaine Metabolite, urine          Cutoff 300 ng/mL Opiate + metabolites, urine        Cutoff 300 ng/mL Phencyclidine (PCP), urine         Cutoff 25 ng/mL Cannabinoid, urine                 Cutoff 50 ng/mL Barbiturates + metabolites, urine  Cutoff 200 ng/mL Benzodiazepine, urine              Cutoff 200 ng/mL Methadone, urine                   Cutoff 300 ng/mL  The urine drug screen provides only a preliminary, unconfirmed analytical test result and should not be used for non-medical purposes. Clinical consideration and professional judgment should be applied to any positive drug screen result due  to possible interfering substances. A more specific alternate chemical method must be used in order to obtain a confirmed analytical result. Gas chromatography / mass spectrometry (GC/MS) is the preferred confirm atory method. Performed at [facility], [address] [place], [place] [address]   Resp Panel by RT-PCR (Flu A&B, Covid) Nasopharyngeal Swab     Status: None   Collection Time: 07/21/21  9:49 PM   Specimen: Nasopharyngeal Swab; Nasopharyngeal(NP) swabs in vial transport  medium  Result Value Ref Range   SARS Coronavirus 2 by RT PCR NEGATIVE NEGATIVE    Comment: (NOTE) SARS-CoV-2 target nucleic acids are NOT DETECTED.  The SARS-CoV-2 RNA is generally detectable in upper respiratory specimens during the acute phase of infection. The lowest concentration of SARS-CoV-2 viral copies this assay can detect is 138 copies/mL. A negative result does not preclude SARS-Cov-2 infection and should not be used as the sole basis for treatment or other patient management decisions. A negative result may occur with  improper specimen collection/handling, submission of specimen other than nasopharyngeal swab, presence of viral mutation(s) within the areas targeted by this assay, and inadequate number of viral copies(<138 copies/mL). A negative result must be combined with clinical observations, patient history, and epidemiological information. The expected result is Negative.  Fact Sheet for Patients:  EntrepreneurPulse.com.au  Fact Sheet for Healthcare Providers:  IncredibleEmployment.be  This test is no t yet approved or cleared by the [place] FDA and  has been authorized for detection and/or diagnosis of SARS-CoV-2 by FDA under an Emergency Use Authorization (EUA). This EUA will remain  in effect (meaning this test can be used) for the duration of the COVID-19 declaration under Section 564(b)(1) of the Act, 21 U.S.C.section 360bbb-3(b)(1), unless the authorization is terminated  or revoked sooner.       Influenza A by PCR NEGATIVE NEGATIVE   Influenza B by PCR NEGATIVE NEGATIVE    Comment: (NOTE) The Xpert Xpress SARS-CoV-2/FLU/RSV plus assay is intended as an aid in the diagnosis of influenza from Nasopharyngeal swab specimens and should not be used as a sole basis for treatment. Nasal washings and aspirates are unacceptable for Xpert Xpress SARS-CoV-2/FLU/RSV testing.  Fact Sheet for  Patients: EntrepreneurPulse.com.au  Fact Sheet for Healthcare Providers: IncredibleEmployment.be  This test is not yet approved or cleared by the [place] FDA and has been authorized for detection and/or diagnosis of SARS-CoV-2 by FDA under an Emergency Use Authorization (EUA). This EUA will remain in effect (meaning this test can be used) for the duration of the COVID-19 declaration under Section 564(b)(1) of the Act, 21 U.S.C. section 360bbb-3(b)(1), unless the authorization is terminated or revoked.  Performed at [facility], [address] [place], [place] [address]   POC CBG, ED     Status: Abnormal   Collection Time: 07/22/21 10:07 AM  Result Value Ref Range   Glucose-Capillary 228 (H) 70 - 99 mg/dL    Comment: Glucose reference range applies only to samples taken after fasting for at least 8 hours.  POC CBG, ED     Status: Abnormal   Collection Time: 07/22/21 11:44 AM  Result Value Ref Range   Glucose-Capillary 141 (H) 70 - 99 mg/dL    Comment: Glucose reference range applies only to samples taken after fasting for at least 8 hours.    Current Facility-Administered Medications  Medication Dose Route Frequency Provider Last Rate Last Admin   acetaminophen (TYLENOL) tablet 650 mg  650 mg Oral Q8H PRN Rada Hay,  MD       albuterol (PROVENTIL) (2.5 MG/3ML) 0.083% nebulizer solution 3 mL  3 mL Inhalation Q6H PRN Rada Hay, MD       alum & mag hydroxide-simeth (MAALOX/MYLANTA) 200-200-20 MG/5ML suspension 30 mL  30 mL Oral Daily PRN Rada Hay, MD       apixaban Arne Cleveland) tablet 5 mg  5 mg Oral BID Rada Hay, MD       cloZAPine (CLOZARIL) tablet 500 mg  500 mg Oral QHS Rada Hay, MD       hydrOXYzine (VISTARIL) capsule 100 mg  100 mg Oral QHS Rada Hay, MD       lisinopril (ZESTRIL) tablet 2.5 mg  2.5 mg Oral Daily Rada Hay, MD       lithium carbonate (ESKALITH) CR  tablet 450 mg  450 mg Oral QHS Rada Hay, MD       lithium carbonate (LITHOBID) CR tablet 300 mg  300 mg Oral q AM Rada Hay, MD       magnesium hydroxide (MILK OF MAGNESIA) suspension 30 mL  30 mL Oral Daily PRN Rada Hay, MD       nicotine (NICODERM CQ - dosed in mg/24 hours) patch 21 mg  21 mg Transdermal Once Sung, Jade J, MD   21 mg at 07/22/21 0145   OLANZapine (ZYPREXA) tablet 20 mg  20 mg Oral QHS Rada Hay, MD       sertraline (ZOLOFT) tablet 100 mg  100 mg Oral Daily Rada Hay, MD       simvastatin (ZOCOR) tablet 40 mg  40 mg Oral q1800 Rada Hay, MD       traZODone (DESYREL) tablet 100 mg  100 mg Oral QHS Rada Hay, MD       Current Outpatient Medications  Medication Sig Dispense Refill   acetaminophen (TYLENOL) 500 MG tablet Take 500 mg by mouth every 4 (four) hours as needed.     acetaminophen (TYLENOL) 650 MG CR tablet Take 650 mg by mouth every 8 (eight) hours as needed for pain.     albuterol (VENTOLIN HFA) 108 (90 Base) MCG/ACT inhaler Inhale 1-2 puffs into the lungs every 6 (six) hours as needed for wheezing or shortness of breath. (Patient taking differently: Inhale 1 puff into the lungs 2 (two) times daily as needed for wheezing or shortness of breath.) 18 g 1   alum & mag hydroxide-simeth (MINTOX) 200-200-20 MG/5ML suspension Take 30 mLs by mouth daily as needed for indigestion or heartburn.     ammonium lactate (AMLACTIN) 12 % cream Apply 1 application topically as needed for dry skin.     blood glucose meter kit and supplies Dispense based on patient and insurance preference. Use up to four times daily as directed. (FOR ICD-10 E10.9, E11.9). 1 each 6   cloZAPine (CLOZARIL) 100 MG tablet Take 5 tablets (500 mg total) by mouth at bedtime. 150 tablet 1   ELIQUIS 5 MG TABS tablet TAKE 1 TABLET BY MOUTH TWICE DAILY. 60 tablet 11   fluticasone-salmeterol (ADVAIR) 250-50 MCG/ACT AEPB Inhale 1 puff into the lungs 2 (two)  times daily.     glucose blood (FORA V30A BLOOD GLUCOSE TEST) test strip CHECK BLOOD SUGAR TWICE DAILY. 100 strip 0   hydrOXYzine (VISTARIL) 50 MG capsule Take 100 mg by mouth at bedtime.     LEVEMIR FLEXTOUCH 100 UNIT/ML FlexTouch Pen INJECT 10 UNITS INTO THE SKIN TWICE  DAILY. 3 mL 11   lisinopril (ZESTRIL) 2.5 MG tablet TAKE 1 TABLET EVERY MORNING FOR BLOOD PRESSURE. 30 tablet 11   lithium carbonate (ESKALITH) 450 MG CR tablet Take 1 tablet (450 mg total) by mouth at bedtime. 30 tablet 1   lithium carbonate (LITHOBID) 300 MG CR tablet Take 1 tablet (300 mg total) by mouth in the morning. 30 tablet 1   magnesium hydroxide (MILK OF MAGNESIA) 400 MG/5ML suspension Take 30 mLs by mouth daily as needed for mild constipation.     metFORMIN (GLUCOPHAGE) 1000 MG tablet TAKE 1 TABLET BY MOUTH TWICE DAILY. 60 tablet 11   NOVOFINE AUTOCOVER PEN NEEDLE 30G X 8 MM MISC USE WITH LEVEMIR TWICE DAILY. 100 each 11   OLANZapine (ZYPREXA) 20 MG tablet Take 1 tablet (20 mg total) by mouth at bedtime. 30 tablet 1   Omega-3 Fatty Acids (FISH OIL) 1000 MG CAPS TAKE 1 CAPSULE BY MOUTH ONCE A DAY. 30 capsule 11   sertraline (ZOLOFT) 100 MG tablet Take 100 mg by mouth daily.     simvastatin (ZOCOR) 40 MG tablet TAKE 1 TABLET BY MOUTH AT 6:00 P.M. 30 tablet 11   traZODone (DESYREL) 100 MG tablet Take 1 tablet (100 mg total) by mouth at bedtime. 30 tablet 1   propranolol (INDERAL) 20 MG tablet Take 1 tablet (20 mg total) by mouth at bedtime. (Patient not taking: Reported on 07/22/2021) 30 tablet 1    Musculoskeletal: Strength & Muscle Tone: within normal limits Gait & Station: normal Patient leans: N/A  Psychiatric Specialty Exam:  Presentation  General Appearance: Appropriate for Environment  Eye Contact:Good  Speech:Clear and Coherent  Speech Volume:Normal  Handedness:Right   Mood and Affect  Mood:Euthymic  Affect:Appropriate; Flat   Thought Process  Thought Processes:Coherent  Descriptions of  Associations:Intact  Orientation:Full (Time, Place and Person)  Thought Content:Perseveration  History of Schizophrenia/Schizoaffective disorder:Yes  Duration of Psychotic Symptoms:Less than six months  Hallucinations:Hallucinations: None Description of Auditory Hallucinations: Voices unsure what it is telling him.  Ideas of Reference:None  Suicidal Thoughts:Suicidal Thoughts: No SI Passive Intent and/or Plan: Without Plan; Without Intent  Homicidal Thoughts:Homicidal Thoughts: No HI Passive Intent and/or Plan: Without Intent; Without Plan   Sensorium  Memory:Immediate Fair  Judgment:Poor  Insight:Poor   Executive Functions  Concentration:Fair  Attention Span:Fair  [place]   Psychomotor Activity  Psychomotor Activity:Psychomotor Activity: Normal   Assets  Assets:Resilience; Physical Health; Housing   Sleep  Sleep:Sleep: Good Number of Hours of Sleep: 9   Physical Exam: Physical Exam Vitals and nursing note reviewed.  HENT:     Head: Normocephalic.     Nose: No congestion or rhinorrhea.  Eyes:     General:        Right eye: No discharge.        Left eye: No discharge.  Cardiovascular:     Rate and Rhythm: Normal rate.  Pulmonary:     Effort: Pulmonary effort is normal.  Musculoskeletal:        General: Normal range of motion.     Cervical back: Normal range of motion.  Skin:    General: Skin is dry.  Neurological:     Mental Status: He is alert and oriented to person, place, and time.   Review of Systems  Psychiatric/Behavioral:  Positive for depression (chronic, stable). Negative for hallucinations, memory loss, substance abuse and suicidal ideas. The patient is not nervous/anxious and does not have insomnia.   All  other systems reviewed and are negative. Blood pressure 117/71, pulse 75, temperature 98.5 F (36.9 C), temperature source Oral, resp. rate 16, height 5' 10" (1.778 m), weight 83.9  kg, SpO2 100 %. Body mass index is 26.54 kg/m.  Treatment Plan Summary: Patient does not meet criteria for inpatient psychiatric admission. Patient can return to group home and follow up with outpatient providers.    Disposition: No evidence of imminent risk to self or others at present.   Patient does not meet criteria for psychiatric inpatient admission. Supportive therapy provided about ongoing stressors. Discussed crisis plan, support from social network, calling 911, coming to the Emergency Department, and calling Suicide Hotline.  Sherlon Handing, NP 07/22/2021 12:16 PM

## 2021-07-22 NOTE — ED Notes (Signed)
Pt threw breakfast tray across floor.

## 2021-07-22 NOTE — ED Notes (Signed)
Pt sitting in recliner of 24H banging wall multiple times.

## 2021-07-22 NOTE — ED Notes (Signed)
This RN contacted [facility] [phone] about pt being discharged and estimated time of transportation for pt. Group Home employee states " I will let the owner know" and hung up. Will attempt call again within the next hour for update if transportation has not arrived.

## 2021-07-22 NOTE — ED Provider Notes (Signed)
Patient seen by psychiatry and cleared for transfer back to his group home.   Georga Hacking, MD 07/22/21 [phone]

## 2021-07-22 NOTE — ED Notes (Signed)
Pt discharge signature placed in medical record

## 2021-07-22 NOTE — ED Notes (Addendum)
Harris,Vivian Legal Guardian [phone]      Called and updated regarding plan for patient disposition back to group. Agreeable to plan and states that she will call group home to facilitate transport back to group home and inform RN.

## 2021-07-22 NOTE — ED Notes (Signed)
Acutely agitated. Refusing care. Diff added onto previous labs.

## 2021-07-22 NOTE — ED Notes (Signed)
Hospital meal provided.  100% consumed, pt tolerated w/o complaints.  Waste discarded appropriately.   

## 2021-07-22 NOTE — ED Notes (Signed)
Pt allowed RN to scan armband for medication administration but when RN took medications to patient, he swung at RN hand with cup in it twice, knocking medications out of cup and onto floor. Refusing to take PO medications.

## 2021-07-22 NOTE — ED Notes (Signed)
Spoke with guardian, Lance [name] who states that she was able to get in touch with group home and that will come pick pt up as soon as able to.

## 2021-07-22 NOTE — Consult Note (Signed)
Pharmacy Consult - Clozapine  46 yo male ordered clozapine 500 mg PO QHS  This patient's order has been reviewed for prescribing contraindications.    Clozapine [facility]S enrollment Verified: yes on 07/22/21 [facility]S patient ID: [id number] RDA: [id number] Current Outpatient Monitoring: Monthly    Home Regimen: Clozapine 500 mg PO QHS Last dose: 07/21/21   Dose Adjustments This Admission: N/A   Labs: Date    [place]    Submitted? 2/6 3000 Yes   Plan: Continue clozapine 500 mg QHS Monitor ANC at least weekly while inpatient

## 2021-07-22 NOTE — ED Provider Notes (Signed)
Emergency Medicine Observation Re-evaluation Note  Dillyn Killeen is a 46 y.o. male, seen on rounds today.  Pt initially presented to the ED for complaints of Psychiatric Evaluation Currently, the patient is resting, voices no medical complaint.  Physical Exam  BP 115/81 (BP Location: Left Arm)    Pulse 71    Temp 98.5 F (36.9 C) (Oral)    Resp 16    Ht 5\' 10"  (1.778 m)    Wt 83.9 kg    SpO2 100%    BMI 26.54 kg/m  Physical Exam General: Resting in no acute distress Cardiac: No cyanosis Lungs: Equal rise and fall Psych: Not agitated  ED Course / MDM  EKG:   I have reviewed the labs performed to date as well as medications administered while in observation.  Recent changes in the last 24 hours include no events overnight.  Plan  Current plan is for psychiatric disposition.  Hamlin Bohanan is not under involuntary commitment.     Paulette Blanch, MD 07/22/21 [phone]

## 2021-07-22 NOTE — Discharge Instructions (Signed)
You are seen by psychiatry and were cleared to go back home.

## 2021-07-22 NOTE — ED Notes (Signed)
Pt given dinner tray.

## 2021-07-22 NOTE — BH Assessment (Signed)
This Probation officer attempted to contact [place] (Legal Guardian) [phone]; however, there was no answer. A HIPPA compliant message was left; requesting a call back.  This writer attempted to contact [facility] [place] [phone] and  [phone] however, there was no answer.

## 2021-07-22 NOTE — Progress Notes (Signed)
°   07/22/21 1505  Clinical Encounter Type  Visited With Patient  Visit Type Initial;Spiritual support;Social support  Referral From Other (Comment) (rounding)  Spiritual Encounters  Spiritual Needs Other (Comment) (social support)   Chaplain Burris engaged Pt and inquired about his well-being and what brought him to the hospital. Chaplain B provided active listening and social support as Pt shared about his group home and conflicts there. Chaplain B offered some strategies for avoiding conflict an to create a sense of peace for oneself in the midst of an environment that challenges our sense of peace. Chaplain B also engaged Pt about some of their thoughts on religion. Chaplain B offered words of encouragement and will f/u on 2/8 if Pt still at [facility].

## 2021-07-22 NOTE — ED Notes (Signed)
EDP, McHugh and NP, Barthold notified of pt most current actions and agitation level.
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
## 2021-07-22 NOTE — Consult Note (Signed)
[facility] Consult   Reason for Consult: Psychiatric Evaluation Referring Physician: Dr. Jari Pigg Patient Identification: Lance [name] MRN:  [id number] Principal Diagnosis: <principal problem not specified> Diagnosis:  Active Problems:   Diabetes mellitus without complication ([place])   Schizoaffective disorder, bipolar type ([place])   Tobacco use disorder   Suicidal ideation   Homicidal thoughts   Total Time spent with patient: 1 hour  Subjective:  "I need to be here to get away from my group home. Nobody likes me there." Lance [name] is a 46 y.o. male patient presented to [facility] ED via law enforcement and here voluntarily. The patient is presenting with identical complaints as previous ED visits. "I do not like my group home, the people" "I have been there for three years, and I do not get along with anyone." "I have tried, and nothing has been done." the patient has a guardian, and I shared with the patient that this is a discussion between him and his guardian. The patient became upset and said "she will not help me find somewhere else for me to go."  The patient was seen face-to-face by this provider; the chart was reviewed and consulted with Dr. Jari Pigg on 07/22/2021 due to the patient's care. It was discussed with the EDP that the patient remained under observation overnight and will be reassessed in the a.m. to determine if he meets the criteria for psychiatric inpatient admission; he could be discharged home.  On evaluation, the patient is alert and oriented x 4, angry but cooperative, and mood-congruent with affect. The patient does not appear to be responding to internal or external stimuli. Neither is the patient presenting with any delusional thinking. The patient admits to auditory and visual hallucinations. The patient is vague in describing what he is experiencing. The patient admits to suicidal and homicidal ideations but without a real plan. The patient is not  presenting with any psychotic or paranoid behaviors.   HPI: Per Dr. Jari Pigg, Lance [name] is a 46 y.o. male with schizophrenia who left the group home was brought in by police.  Patient reports not getting along with staff.  He reports some HI to the people he does not like there.  He also reports some SI but denies any active plan.  He reports just feeling fatigued.  Denies any hitting his head.  Denies any EtOH use.  He states that he sometimes takes his medications but sometimes the group home does not give them all to him.  Past Psychiatric History: Schizoaffective disorder The patient frequently presents to the ED with complaints against the group home.  The patient has a past history of nonsuicidal self-injurious behavior.  Risk to Self:   Risk to Others:   Prior Inpatient Therapy:   Prior Outpatient Therapy:    Past Medical History:  Past Medical History:  Diagnosis Date   Depression    Diabetes mellitus without complication ([place])    Hyperlipidemia    Hypertension    Lupus anticoagulant disorder ([place]) 06/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   PE (pulmonary thromboembolism) ([place])    Schizo affective schizophrenia ([place])    Supratherapeutic INR 11/19/2016   No past surgical history on file. Family History:  Family History  Problem Relation Age of Onset   CAD Mother    CAD Sister    Family Psychiatric  History:  Social History:  Social History   Substance and Sexual Activity  Alcohol Use No   Comment:  occassionally     Social History   Substance and Sexual Activity  Drug Use Not Currently   Types: Marijuana   Comment: Last use 11/29/16    Social History   Socioeconomic History   Marital status: Single    Spouse name: Not on file   Number of children: Not on file   Years of education: Not on file   Highest education level: Not on file  Occupational History   Not on file  Tobacco Use   Smoking status: Every Day    Packs/day: 1.00     Years: 23.00    Pack years: 23.00    Types: Cigarettes   Smokeless tobacco: Never   Tobacco comments:    will provide material  Substance and Sexual Activity   Alcohol use: No    Comment: occassionally   Drug use: Not Currently    Types: Marijuana    Comment: Last use 11/29/16   Sexual activity: Never    Birth control/protection: None    Comment: occasional marijuana- none recently  Other Topics Concern   Not on file  Social History Narrative   From a group home in [place] Resource Strain: Low Risk    Difficulty of Paying Living Expenses: Not very hard  Food Insecurity: No Food Insecurity   Worried About Charity fundraiser in the Last Year: Never true   Ran Out of Food in the Last Year: Never true  Transportation Needs: No Transportation Needs   Lack of Transportation (Medical): No   Lack of Transportation (Non-Medical): No  Physical Activity: Insufficiently Active   Days of Exercise per Week: 3 days   Minutes of Exercise per Session: 20 min  Stress: No Stress Concern Present   Feeling of Stress : Only a little  Social Connections: Socially Isolated   Frequency of Communication with Friends and Family: More than three times a week   Frequency of Social Gatherings with Friends and Family: More than three times a week   Attends Religious Services: Never   Marine scientist or Organizations: No   Attends Archivist Meetings: Never   Marital Status: Never married   Additional Social History:    Allergies:   Allergies  Allergen Reactions   Penicillins Other (See Comments)    Reaction: "lockjaw" Has patient had a PCN reaction causing immediate rash, facial/tongue/throat swelling, SOB or lightheadedness with hypotension: No Has patient had a PCN reaction causing severe rash involving mucus membranes or skin necrosis: No Has patient had a PCN reaction that required hospitalization:  No Has patient had a PCN reaction occurring within the last 10 years: No If all of the above answers are "NO", then may proceed with Cephalosporin use.     Labs:  Results for orders placed or performed during the hospital encounter of 07/21/21 (from the past 48 hour(s))  Comprehensive metabolic panel     Status: Abnormal   Collection Time: 07/21/21  9:18 PM  Result Value Ref Range   Sodium 140 135 - 145 mmol/L   Potassium 4.1 3.5 - 5.1 mmol/L   Chloride 111 98 - 111 mmol/L   CO2 22 22 - 32 mmol/L   Glucose, Bld 104 (H) 70 - 99 mg/dL    Comment: Glucose reference range applies only to samples taken after fasting for at least 8 hours.   BUN 22 (H) 6 - 20 mg/dL   Creatinine, Ser 1.04 0.61 -  1.24 mg/dL   Calcium 9.9 8.9 - 10.3 mg/dL   Total Protein 7.2 6.5 - 8.1 g/dL   Albumin 4.4 3.5 - 5.0 g/dL   AST 20 15 - 41 U/L   ALT 12 0 - 44 U/L   Alkaline Phosphatase 71 38 - 126 U/L   Total Bilirubin 0.6 0.3 - 1.2 mg/dL   GFR, Estimated >60 >60 mL/min    Comment: (NOTE) Calculated using the CKD-EPI Creatinine Equation (2021)    Anion gap 7 5 - 15    Comment: Performed at [facility], [address] [place], [place] [address]  Ethanol     Status: None   Collection Time: 07/21/21  9:18 PM  Result Value Ref Range   Alcohol, Ethyl (B) <10 <10 mg/dL    Comment: (NOTE) Lowest detectable limit for serum alcohol is 10 mg/dL.  For medical purposes only. Performed at [facility], [address] [place], [place] [address]   Salicylate level     Status: Abnormal   Collection Time: 07/21/21  9:18 PM  Result Value Ref Range   Salicylate Lvl <1.9 (L) 7.0 - 30.0 mg/dL    Comment: Performed at [facility], [address] [place], [place] [address]  Acetaminophen level     Status: Abnormal   Collection Time: 07/21/21  9:18 PM  Result Value Ref Range   Acetaminophen (Tylenol), Serum <10 (L) 10 - 30 ug/mL    Comment: (NOTE) Therapeutic concentrations vary  significantly. A range of 10-30 ug/mL  may be an effective concentration for many patients. However, some  are best treated at concentrations outside of this range. Acetaminophen concentrations >150 ug/mL at 4 hours after ingestion  and >50 ug/mL at 12 hours after ingestion are often associated with  toxic reactions.  Performed at [facility], [address] [place], [place] [address]   cbc     Status: None   Collection Time: 07/21/21  9:18 PM  Result Value Ref Range   WBC 5.1 4.0 - 10.5 K/uL   RBC 4.48 4.22 - 5.81 MIL/uL   Hemoglobin 14.0 13.0 - 17.0 g/dL   HCT 40.9 39.0 - 52.0 %   MCV 91.3 80.0 - 100.0 fL   MCH 31.3 26.0 - 34.0 pg   MCHC 34.2 30.0 - 36.0 g/dL   RDW 12.5 11.5 - 15.5 %   Platelets 227 150 - 400 K/uL   nRBC 0.0 0.0 - 0.2 %    Comment: Performed at [facility], [address] [place],  [address]  Urine Drug Screen, Qualitative     Status: Abnormal   Collection Time: 07/21/21  9:18 PM  Result Value Ref Range   Tricyclic, Ur Screen POSITIVE (A) NONE DETECTED   Amphetamines, Ur Screen NONE DETECTED NONE DETECTED   MDMA (Ecstasy)Ur Screen NONE DETECTED NONE DETECTED   Cocaine Metabolite,Ur [place] NONE DETECTED NONE DETECTED   Opiate, Ur Screen NONE DETECTED NONE DETECTED   Phencyclidine (PCP) Ur S NONE DETECTED NONE DETECTED   Cannabinoid 50 Ng, Ur [place] NONE DETECTED NONE DETECTED   Barbiturates, Ur Screen NONE DETECTED NONE DETECTED   Benzodiazepine, Ur Scrn NONE DETECTED NONE DETECTED   Methadone Scn, Ur NONE DETECTED NONE DETECTED    Comment: (NOTE) Tricyclics + metabolites, urine    Cutoff 1000 ng/mL Amphetamines + metabolites, urine  Cutoff 1000 ng/mL MDMA (Ecstasy), urine              Cutoff 500 ng/mL Cocaine Metabolite,  urine          Cutoff 300 ng/mL Opiate + metabolites, urine        Cutoff 300 ng/mL Phencyclidine (PCP), urine         Cutoff 25 ng/mL Cannabinoid, urine                 Cutoff 50 ng/mL Barbiturates + metabolites,  urine  Cutoff 200 ng/mL Benzodiazepine, urine              Cutoff 200 ng/mL Methadone, urine                   Cutoff 300 ng/mL  The urine drug screen provides only a preliminary, unconfirmed analytical test result and should not be used for non-medical purposes. Clinical consideration and professional judgment should be applied to any positive drug screen result due to possible interfering substances. A more specific alternate chemical method must be used in order to obtain a confirmed analytical result. Gas chromatography / mass spectrometry (GC/MS) is the preferred confirm atory method. Performed at [facility], [address] [place], [place] [address]   Resp Panel by RT-PCR (Flu A&B, Covid) Nasopharyngeal Swab     Status: None   Collection Time: 07/21/21  9:49 PM   Specimen: Nasopharyngeal Swab; Nasopharyngeal(NP) swabs in vial transport medium  Result Value Ref Range   SARS Coronavirus 2 by RT PCR NEGATIVE NEGATIVE    Comment: (NOTE) SARS-CoV-2 target nucleic acids are NOT DETECTED.  The SARS-CoV-2 RNA is generally detectable in upper respiratory specimens during the acute phase of infection. The lowest concentration of SARS-CoV-2 viral copies this assay can detect is 138 copies/mL. A negative result does not preclude SARS-Cov-2 infection and should not be used as the sole basis for treatment or other patient management decisions. A negative result may occur with  improper specimen collection/handling, submission of specimen other than nasopharyngeal swab, presence of viral mutation(s) within the areas targeted by this assay, and inadequate number of viral copies(<138 copies/mL). A negative result must be combined with clinical observations, patient history, and epidemiological information. The expected result is Negative.  Fact Sheet for Patients:  EntrepreneurPulse.com.au  Fact Sheet for Healthcare Providers:   IncredibleEmployment.be  This test is no t yet approved or cleared by the [place] FDA and  has been authorized for detection and/or diagnosis of SARS-CoV-2 by FDA under an Emergency Use Authorization (EUA). This EUA will remain  in effect (meaning this test can be used) for the duration of the COVID-19 declaration under Section 564(b)(1) of the Act, 21 U.S.C.section 360bbb-3(b)(1), unless the authorization is terminated  or revoked sooner.       Influenza A by PCR NEGATIVE NEGATIVE   Influenza B by PCR NEGATIVE NEGATIVE    Comment: (NOTE) The Xpert Xpress SARS-CoV-2/FLU/RSV plus assay is intended as an aid in the diagnosis of influenza from Nasopharyngeal swab specimens and should not be used as a sole basis for treatment. Nasal washings and aspirates are unacceptable for Xpert Xpress SARS-CoV-2/FLU/RSV testing.  Fact Sheet for Patients: EntrepreneurPulse.com.au  Fact Sheet for Healthcare Providers: IncredibleEmployment.be  This test is not yet approved or cleared by the [place] FDA and has been authorized for detection and/or diagnosis of SARS-CoV-2 by FDA under an Emergency Use Authorization (EUA). This EUA will remain in effect (meaning this test can be used) for the duration of the COVID-19 declaration under Section 564(b)(1) of the Act, 21 U.S.C. section 360bbb-3(b)(1), unless the authorization is terminated or  revoked.  Performed at [facility], [address] [place], [place] [address]     No current facility-administered medications for this encounter.   Current Outpatient Medications  Medication Sig Dispense Refill   ADVAIR DISKUS 250-50 MCG/ACT AEPB Inhale 1 puff into the lungs 2 (two) times daily.     albuterol (VENTOLIN HFA) 108 (90 Base) MCG/ACT inhaler Inhale 1-2 puffs into the lungs every 6 (six) hours as needed for wheezing or shortness of breath. 18 g 1   blood glucose  meter kit and supplies Dispense based on patient and insurance preference. Use up to four times daily as directed. (FOR ICD-10 E10.9, E11.9). 1 each 6   cloZAPine (CLOZARIL) 100 MG tablet Take 5 tablets (500 mg total) by mouth at bedtime. 150 tablet 1   ELIQUIS 5 MG TABS tablet TAKE 1 TABLET BY MOUTH TWICE DAILY. 60 tablet 11   FORA Lancets MISC      glucose blood (FORA V30A BLOOD GLUCOSE TEST) test strip CHECK BLOOD SUGAR TWICE DAILY. 100 strip 0   hydrOXYzine (VISTARIL) 50 MG capsule Take 50 mg by mouth 2 (two) times daily as needed.     LEVEMIR FLEXTOUCH 100 UNIT/ML FlexTouch Pen INJECT 10 UNITS INTO THE SKIN TWICE DAILY. 3 mL 11   lisinopril (ZESTRIL) 2.5 MG tablet TAKE 1 TABLET EVERY MORNING FOR BLOOD PRESSURE. 30 tablet 11   lithium carbonate (ESKALITH) 450 MG CR tablet Take 1 tablet (450 mg total) by mouth at bedtime. 30 tablet 1   lithium carbonate (LITHOBID) 300 MG CR tablet Take 1 tablet (300 mg total) by mouth in the morning. 30 tablet 1   metFORMIN (GLUCOPHAGE) 1000 MG tablet TAKE 1 TABLET BY MOUTH TWICE DAILY. 60 tablet 11   NOVOFINE AUTOCOVER PEN NEEDLE 30G X 8 MM MISC USE WITH LEVEMIR TWICE DAILY. 100 each 11   OLANZapine (ZYPREXA) 20 MG tablet Take 1 tablet (20 mg total) by mouth at bedtime. 30 tablet 1   Omega-3 Fatty Acids (FISH OIL) 1000 MG CAPS TAKE 1 CAPSULE BY MOUTH ONCE A DAY. 30 capsule 11   propranolol (INDERAL) 20 MG tablet Take 1 tablet (20 mg total) by mouth at bedtime. 30 tablet 1   sertraline (ZOLOFT) 100 MG tablet Take 100 mg by mouth daily.     simvastatin (ZOCOR) 40 MG tablet TAKE 1 TABLET BY MOUTH AT 6:00 P.M. 30 tablet 11   traZODone (DESYREL) 100 MG tablet Take 1 tablet (100 mg total) by mouth at bedtime. 30 tablet 1    Musculoskeletal: Strength & Muscle Tone: within normal limits Gait & Station: normal Patient leans: N/A  Psychiatric Specialty Exam:  Presentation  General Appearance: Bizarre  Eye Contact:Good  Speech:Clear and  Coherent  Speech Volume:Normal  Handedness:Right   Mood and Affect  Mood:Depressed; Angry  Affect:Blunt   Thought Process  Thought Processes:Coherent  Descriptions of Associations:Intact  Orientation:Full (Time, Place and Person)  Thought Content:WDL  History of Schizophrenia/Schizoaffective disorder:Yes  Duration of Psychotic Symptoms:Less than six months  Hallucinations:Hallucinations: Auditory Description of Auditory Hallucinations: Voices unsure what it is telling him.  Ideas of Reference:None  Suicidal Thoughts:Suicidal Thoughts: Yes, Passive SI Passive Intent and/or Plan: Without Plan; Without Intent  Homicidal Thoughts:Homicidal Thoughts: Yes, Passive HI Passive Intent and/or Plan: Without Intent; Without Plan   Sensorium  Memory:Immediate Good; Recent Good; Remote Good  Judgment:Poor  Insight:Poor   Executive Functions  Concentration:Good  Attention Span:Good  Recall:Good  Fund of Knowledge:Poor  Language:Fair   Psychomotor Activity  Psychomotor Activity:Psychomotor Activity: Normal   Assets  Assets:Communication Skills; Desire for Improvement; Resilience; Social Support   Sleep  Sleep:Sleep: Good Number of Hours of Sleep: 9   Physical Exam: Physical Exam Vitals and nursing note reviewed.  Constitutional:      Appearance: Normal appearance. He is normal weight.  HENT:     Head: Normocephalic and atraumatic.     Right Ear: External ear normal.     Left Ear: External ear normal.     Nose: Nose normal.     Mouth/Throat:     Mouth: Mucous membranes are moist.  Cardiovascular:     Rate and Rhythm: Normal rate.     Pulses: Normal pulses.  Pulmonary:     Effort: Pulmonary effort is normal.  Musculoskeletal:        General: Normal range of motion.  Neurological:     General: No focal deficit present.     Mental Status: He is oriented to person, place, and time.  Psychiatric:        Attention and Perception: Attention  normal. He perceives auditory and visual hallucinations.        Mood and Affect: Mood is depressed. Affect is angry.        Speech: Speech normal.        Behavior: Behavior normal. Behavior is cooperative.        Thought Content: Thought content includes homicidal and suicidal ideation.        Cognition and Memory: Cognition and memory normal.        Judgment: Judgment normal.   Review of Systems  Psychiatric/Behavioral:  Positive for hallucinations and suicidal ideas.   Height '5\' 10"'  (1.778 m), weight 83.9 kg. Body mass index is 26.54 kg/m.  Treatment Plan Summary: Plan The patient remained under observation overnight and will be reassessed in the a.m. to determine if he meets the criteria for psychiatric inpatient admission; he could be discharged home.  Disposition: No evidence of imminent risk to self or others at present.   The patient remained under observation overnight and will be reassessed in the a.m. to determine if he meets the criteria for psychiatric inpatient admission; he could be discharged home.  Caroline Sauger, NP 07/22/2021 1:38 AM

## 2021-07-22 NOTE — ED Notes (Signed)
Psych team at bedside. Pt expresses frustration when psych interview completed as he stated that he "needs to stay here to get my mind straight".

## 2021-07-28 ENCOUNTER — Encounter: Payer: Self-pay | Admitting: Podiatry

## 2021-07-28 ENCOUNTER — Ambulatory Visit (INDEPENDENT_AMBULATORY_CARE_PROVIDER_SITE_OTHER): Payer: Medicare Other | Admitting: Podiatry

## 2021-07-28 ENCOUNTER — Other Ambulatory Visit: Payer: Self-pay

## 2021-07-28 DIAGNOSIS — M79676 Pain in unspecified toe(s): Secondary | ICD-10-CM | POA: Diagnosis not present

## 2021-07-28 DIAGNOSIS — Q828 Other specified congenital malformations of skin: Secondary | ICD-10-CM

## 2021-07-28 DIAGNOSIS — Z7901 Long term (current) use of anticoagulants: Secondary | ICD-10-CM

## 2021-07-28 DIAGNOSIS — B351 Tinea unguium: Secondary | ICD-10-CM | POA: Diagnosis not present

## 2021-07-28 DIAGNOSIS — E119 Type 2 diabetes mellitus without complications: Secondary | ICD-10-CM

## 2021-07-28 NOTE — Progress Notes (Signed)
This patient returns to my office for at risk foot care.  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  Patient is taking eliquiss.  . This patient is unable to cut nails himself since the patient cannot reach his nails.These nails are painful walking and wearing shoes.  This patient presents for at risk foot care today.  General Appearance  Alert, conversant and in no acute stress.  Vascular  Dorsalis pedis and posterior tibial  pulses are palpable  bilaterally.  Capillary return is within normal limits  bilaterally. Temperature is within normal limits  bilaterally.  Neurologic  Senn-Weinstein monofilament wire test within normal limits  bilaterally. Muscle power within normal limits bilaterally.  Nails Thick disfigured discolored nails with subungual debris  from hallux to fifth toes bilaterally. No evidence of bacterial infection or drainage bilaterally. Nail spicules noted hallux nails  B/L.  Orthopedic  No limitations of motion  feet .  No crepitus or effusions noted.  No bony pathology or digital deformities noted.  Skin  normotropic skin  noted bilaterally.  No signs of infections or ulcers noted.  Porokeratosis sub 4 right foot. Callus sub hallux IPJ left foot.  Onychomycosis  Pain in right toes  Pain in left toes    Consent was obtained for treatment procedures.   Mechanical debridement of nails 1-5  bilaterally performed with a nail nipper.  Filed with dremel without incident. Debride porokeratosis B/l. With # 15 blade.   Return office visit   prn                  Told patient to return for periodic foot care and evaluation due to potential at risk complications.   Hel[place] Gunther DPM

## 2021-08-08 ENCOUNTER — Other Ambulatory Visit: Payer: Self-pay | Admitting: *Deleted

## 2021-08-08 MED ORDER — FORA V30A BLOOD GLUCOSE TEST VI STRP: ORAL_STRIP | 3 refills | Status: DC

## 2021-09-29 ENCOUNTER — Ambulatory Visit (INDEPENDENT_AMBULATORY_CARE_PROVIDER_SITE_OTHER): Payer: Medicare Other | Admitting: Internal Medicine

## 2021-09-29 ENCOUNTER — Encounter: Payer: Self-pay | Admitting: Internal Medicine

## 2021-09-29 VITALS — BP 111/64 | HR 73 | Ht 70.0 in | Wt 185.0 lb

## 2021-09-29 DIAGNOSIS — E119 Type 2 diabetes mellitus without complications: Secondary | ICD-10-CM

## 2021-09-29 DIAGNOSIS — R45851 Suicidal ideations: Secondary | ICD-10-CM

## 2021-09-29 DIAGNOSIS — F250 Schizoaffective disorder, bipolar type: Secondary | ICD-10-CM

## 2021-09-29 DIAGNOSIS — I10 Essential (primary) hypertension: Secondary | ICD-10-CM | POA: Diagnosis not present

## 2021-09-29 DIAGNOSIS — E782 Mixed hyperlipidemia: Secondary | ICD-10-CM

## 2021-09-29 DIAGNOSIS — F1010 Alcohol abuse, uncomplicated: Secondary | ICD-10-CM

## 2021-09-29 LAB — GLUCOSE, POCT (MANUAL RESULT ENTRY): POC Glucose: 74 mg/dl (ref 70–99)

## 2021-09-29 NOTE — Progress Notes (Signed)
? ?Established Patient Office Visit ? ?Subjective:  ?Patient ID: Lance [name], male    DOB: October 09, 1975  Age: 46 y.o. MRN: [id number] ? ?CC:  ?Chief Complaint  ?Patient presents with  ? Diabetes  ? ? ?Diabetes ? ? ?Lance [name] presents for check up ? ?Past Medical History:  ?Diagnosis Date  ? Depression   ? Diabetes mellitus without complication ([place])   ? Hyperlipidemia   ? Hypertension   ? Lupus anticoagulant disorder ([place]) 06/14/2012  ? as per Dr.pandit's note in dec 2013.  ? PE (pulmonary thromboembolism) ([place])   ? Schizo affective schizophrenia ([place])   ? Supratherapeutic INR 11/19/2016  ? ? ?History reviewed. No pertinent surgical history. ? ?Family History  ?Problem Relation Age of Onset  ? CAD Mother   ? CAD Sister   ? ? ?Social History  ? ?Socioeconomic History  ? Marital status: Single  ?  Spouse name: Not on file  ? Number of children: Not on file  ? Years of education: Not on file  ? Highest education level: Not on file  ?Occupational History  ? Not on file  ?Tobacco Use  ? Smoking status: Every Day  ?  Packs/day: 1.00  ?  Years: 23.00  ?  Pack years: 23.00  ?  Types: Cigarettes  ? Smokeless tobacco: Never  ? Tobacco comments:  ?  will provide material  ?Substance and Sexual Activity  ? Alcohol use: No  ?  Comment: occassionally  ? Drug use: Not Currently  ?  Types: Marijuana  ?  Comment: Last use 11/29/16  ? Sexual activity: Never  ?  Birth control/protection: None  ?  Comment: occasional marijuana- none recently  ?Other Topics Concern  ? Not on file  ?Social History Narrative  ? From a group home in [place]  ? ?Social Determinants of Health  ? ?Financial Resource Strain: Low Risk   ? Difficulty of Paying Living Expenses: Not very hard  ?Food Insecurity: No Food Insecurity  ? Worried About Charity fundraiser in the Last Year: Never true  ? Ran Out of Food in the Last Year: Never true  ?Transportation Needs: No Transportation Needs  ? Lack of Transportation (Medical): No  ? Lack of Transportation  (Non-Medical): No  ?Physical Activity: Insufficiently Active  ? Days of Exercise per Week: 3 days  ? Minutes of Exercise per Session: 20 min  ?Stress: No Stress Concern Present  ? Feeling of Stress : Only a little  ?Social Connections: Socially Isolated  ? Frequency of Communication with Friends and Family: More than three times a week  ? Frequency of Social Gatherings with Friends and Family: More than three times a week  ? Attends Religious Services: Never  ? Active Member of Clubs or Organizations: No  ? Attends Archivist Meetings: Never  ? Marital Status: Never married  ?Intimate Partner Violence: Not At Risk  ? Fear of Current or Ex-Partner: No  ? Emotionally Abused: No  ? Physically Abused: No  ? Sexually Abused: No  ? ? ? ?Current Outpatient Medications:  ?  acetaminophen (TYLENOL) 500 MG tablet, Take 500 mg by mouth every 4 (four) hours as needed., Disp: , Rfl:  ?  acetaminophen (TYLENOL) 650 MG CR tablet, Take 650 mg by mouth every 8 (eight) hours as needed for pain., Disp: , Rfl:  ?  albuterol (VENTOLIN HFA) 108 (90 Base) MCG/ACT inhaler, Inhale 1-2 puffs into the lungs every 6 (six) hours as needed for  wheezing or shortness of breath. (Patient taking differently: Inhale 1 puff into the lungs 2 (two) times daily as needed for wheezing or shortness of breath.), Disp: 18 g, Rfl: 1 ?  alum & mag hydroxide-simeth (MAALOX/MYLANTA) 200-200-20 MG/5ML suspension, Take 30 mLs by mouth daily as needed for indigestion or heartburn., Disp: , Rfl:  ?  ammonium lactate (AMLACTIN) 12 % cream, Apply 1 application topically as needed for dry skin., Disp: , Rfl:  ?  blood glucose meter kit and supplies, Dispense based on patient and insurance preference. Use up to four times daily as directed. (FOR ICD-10 E10.9, E11.9)., Disp: 1 each, Rfl: 6 ?  cloZAPine (CLOZARIL) 100 MG tablet, Take 5 tablets (500 mg total) by mouth at bedtime., Disp: 150 tablet, Rfl: 1 ?  ELIQUIS 5 MG TABS tablet, TAKE 1 TABLET BY MOUTH TWICE  DAILY., Disp: 60 tablet, Rfl: 11 ?  fluticasone-salmeterol (ADVAIR) 250-50 MCG/ACT AEPB, Inhale 1 puff into the lungs 2 (two) times daily., Disp: , Rfl:  ?  glucose blood (FORA V30A BLOOD GLUCOSE TEST) test strip, CHECK BLOOD SUGAR TWICE DAILY., Disp: 100 strip, Rfl: 3 ?  hydrOXYzine (VISTARIL) 50 MG capsule, Take 100 mg by mouth at bedtime., Disp: , Rfl:  ?  LEVEMIR FLEXTOUCH 100 UNIT/ML FlexTouch Pen, INJECT 10 UNITS INTO THE SKIN TWICE DAILY., Disp: 3 mL, Rfl: 11 ?  lisinopril (ZESTRIL) 2.5 MG tablet, TAKE 1 TABLET EVERY MORNING FOR BLOOD PRESSURE., Disp: 30 tablet, Rfl: 11 ?  lithium carbonate (ESKALITH) 450 MG CR tablet, Take 1 tablet (450 mg total) by mouth at bedtime., Disp: 30 tablet, Rfl: 1 ?  lithium carbonate (LITHOBID) 300 MG CR tablet, Take 1 tablet (300 mg total) by mouth in the morning., Disp: 30 tablet, Rfl: 1 ?  magnesium hydroxide (MILK OF MAGNESIA) 400 MG/5ML suspension, Take 30 mLs by mouth daily as needed for mild constipation., Disp: , Rfl:  ?  metFORMIN (GLUCOPHAGE) 1000 MG tablet, TAKE 1 TABLET BY MOUTH TWICE DAILY., Disp: 60 tablet, Rfl: 11 ?  NOVOFINE AUTOCOVER PEN NEEDLE 30G X 8 MM MISC, USE WITH LEVEMIR TWICE DAILY., Disp: 100 each, Rfl: 11 ?  OLANZapine (ZYPREXA) 20 MG tablet, Take 1 tablet (20 mg total) by mouth at bedtime., Disp: 30 tablet, Rfl: 1 ?  Omega-3 Fatty Acids (FISH OIL) 1000 MG CAPS, TAKE 1 CAPSULE BY MOUTH ONCE A DAY., Disp: 30 capsule, Rfl: 11 ?  propranolol (INDERAL) 20 MG tablet, Take 1 tablet (20 mg total) by mouth at bedtime., Disp: 30 tablet, Rfl: 1 ?  sertraline (ZOLOFT) 100 MG tablet, Take 100 mg by mouth daily., Disp: , Rfl:  ?  simvastatin (ZOCOR) 40 MG tablet, TAKE 1 TABLET BY MOUTH AT 6:00 P.M., Disp: 30 tablet, Rfl: 11 ?  traZODone (DESYREL) 100 MG tablet, Take 1 tablet (100 mg total) by mouth at bedtime., Disp: 30 tablet, Rfl: 1  ? ?Allergies  ?Allergen Reactions  ? Penicillins Other (See Comments)  ?  Reaction: "lockjaw" ?Has patient had a PCN reaction  causing immediate rash, facial/tongue/throat swelling, SOB or lightheadedness with hypotension: No ?Has patient had a PCN reaction causing severe rash involving mucus membranes or skin necrosis: No ?Has patient had a PCN reaction that required hospitalization: No ?Has patient had a PCN reaction occurring within the last 10 years: No ?If all of the above answers are "NO", then may proceed with Cephalosporin use. ?  ? ? ?ROS ?Review of Systems  ?Constitutional: Negative.   ?HENT: Negative.    ?Eyes:  Negative.   ?Respiratory: Negative.    ?Cardiovascular: Negative.   ?Gastrointestinal: Negative.   ?Endocrine: Negative.   ?Genitourinary: Negative.   ?Musculoskeletal: Negative.   ?Skin: Negative.   ?Allergic/Immunologic: Negative.   ?Neurological: Negative.   ?Hematological: Negative.   ?Psychiatric/Behavioral: Negative.    ?All other systems reviewed and are negative. ? ?  ?Objective:  ?  ?Physical Exam ?Vitals reviewed.  ?Constitutional:   ?   Appearance: Normal appearance.  ?HENT:  ?   Mouth/Throat:  ?   Mouth: Mucous membranes are moist.  ?Eyes:  ?   Pupils: Pupils are equal, round, and reactive to light.  ?Neck:  ?   Vascular: No carotid bruit.  ?Cardiovascular:  ?   Rate and Rhythm: Normal rate and regular rhythm.  ?   Pulses: Normal pulses.  ?   Heart sounds: Normal heart sounds.  ?Pulmonary:  ?   Effort: Pulmonary effort is normal.  ?   Breath sounds: Normal breath sounds.  ?Abdominal:  ?   General: Bowel sounds are normal.  ?   Palpations: Abdomen is soft. There is no hepatomegaly, splenomegaly or mass.  ?   Tenderness: There is no abdominal tenderness.  ?   Hernia: No hernia is present.  ?Musculoskeletal:  ?   Cervical back: Neck supple.  ?   Right lower leg: No edema.  ?   Left lower leg: No edema.  ?Skin: ?   Findings: No rash.  ?Neurological:  ?   Mental Status: He is alert and oriented to person, place, and time.  ?   Motor: No weakness.  ?Psychiatric:     ?   Mood and Affect: Mood normal.     ?   Behavior:  Behavior normal.  ? ? ?BP 111/64   Pulse 73   Ht _0  (1.778 m)   Wt 185 lb (83.9 kg)   BMI 26.54 kg/m?  ?Wt Readings from Last 3 Encounters:  ?09/29/21 185 lb (83.9 kg)  ?07/21/21 185 lb (83.9 kg)  ?12/

## 2021-09-29 NOTE — Assessment & Plan Note (Signed)
Stable

## 2021-09-29 NOTE — Assessment & Plan Note (Signed)
Patient to follow low-cholesterol diet ?

## 2021-09-29 NOTE — Assessment & Plan Note (Signed)
-   The patient's blood sugar is labile on med. °- The patient will continue the current treatment regimen.  °- I encouraged the patient to regularly check blood sugar.  °- I encouraged the patient to monitor diet. I encouraged the patient to eat low-carb and low-sugar to help prevent blood sugar spikes.  °- I encouraged the patient to continue following their prescribed treatment plan for diabetes °- I informed the patient to get help if blood sugar drops below 54mg/dL, or if suddenly have trouble thinking clearly or breathing.  °Patient was advised to buy a book on diabetes from a local bookstore or from [employer].  Patient should read 2 chapters every day to keep the motivation going, this is in addition to some of the materials we provided them from the office.  There are other resources on the Internet like YouTube and wilkipedia to get an education on the diabetes °

## 2021-09-29 NOTE — Assessment & Plan Note (Signed)
Blood pressure is stable 

## 2021-09-29 NOTE — Assessment & Plan Note (Signed)
none

## 2021-09-29 NOTE — Assessment & Plan Note (Signed)
Patient does not drink anymore. 

## 2021-10-10 ENCOUNTER — Other Ambulatory Visit: Payer: Self-pay

## 2021-11-05 ENCOUNTER — Other Ambulatory Visit: Payer: Self-pay | Admitting: Internal Medicine

## 2021-11-20 ENCOUNTER — Emergency Department
Admission: EM | Admit: 2021-11-20 | Discharge: 2021-11-21 | Disposition: A | Discharge status: Home / Self Care | Payer: Medicare Other | Attending: Emergency Medicine | Admitting: Emergency Medicine

## 2021-11-20 ENCOUNTER — Other Ambulatory Visit: Payer: Self-pay

## 2021-11-20 DIAGNOSIS — F17200 Nicotine dependence, unspecified, uncomplicated: Secondary | ICD-10-CM | POA: Diagnosis present

## 2021-11-20 DIAGNOSIS — F250 Schizoaffective disorder, bipolar type: Secondary | ICD-10-CM | POA: Insufficient documentation

## 2021-11-20 DIAGNOSIS — R45850 Homicidal ideations: Secondary | ICD-10-CM | POA: Diagnosis not present

## 2021-11-20 DIAGNOSIS — Y900 Blood alcohol level of less than 20 mg/100 ml: Secondary | ICD-10-CM | POA: Insufficient documentation

## 2021-11-20 DIAGNOSIS — F17210 Nicotine dependence, cigarettes, uncomplicated: Secondary | ICD-10-CM | POA: Insufficient documentation

## 2021-11-20 DIAGNOSIS — E119 Type 2 diabetes mellitus without complications: Secondary | ICD-10-CM

## 2021-11-20 DIAGNOSIS — F319 Bipolar disorder, unspecified: Secondary | ICD-10-CM | POA: Diagnosis present

## 2021-11-20 DIAGNOSIS — I10 Essential (primary) hypertension: Secondary | ICD-10-CM | POA: Diagnosis present

## 2021-11-20 DIAGNOSIS — R45851 Suicidal ideations: Secondary | ICD-10-CM

## 2021-11-20 DIAGNOSIS — F419 Anxiety disorder, unspecified: Secondary | ICD-10-CM | POA: Diagnosis present

## 2021-11-20 DIAGNOSIS — F1010 Alcohol abuse, uncomplicated: Secondary | ICD-10-CM | POA: Diagnosis not present

## 2021-11-20 LAB — CBC
HCT: 41.5 % (ref 39.0–52.0)
Hemoglobin: 13.8 g/dL (ref 13.0–17.0)
MCH: 30.4 pg (ref 26.0–34.0)
MCHC: 33.3 g/dL (ref 30.0–36.0)
MCV: 91.4 fL (ref 80.0–100.0)
Platelets: 205 10*3/uL (ref 150–400)
RBC: 4.54 MIL/uL (ref 4.22–5.81)
RDW: 13.1 % (ref 11.5–15.5)
WBC: 5.2 10*3/uL (ref 4.0–10.5)
nRBC: 0 % (ref 0.0–0.2)

## 2021-11-20 LAB — COMPREHENSIVE METABOLIC PANEL
ALT: 18 U/L (ref 0–44)
AST: 16 U/L (ref 15–41)
Albumin: 4.5 g/dL (ref 3.5–5.0)
Alkaline Phosphatase: 57 U/L (ref 38–126)
Anion gap: 3 — ABNORMAL LOW (ref 5–15)
BUN: 23 mg/dL — ABNORMAL HIGH (ref 6–20)
CO2: 26 mmol/L (ref 22–32)
Calcium: 9.7 mg/dL (ref 8.9–10.3)
Chloride: 114 mmol/L — ABNORMAL HIGH (ref 98–111)
Creatinine, Ser: 0.98 mg/dL (ref 0.61–1.24)
GFR, Estimated: >60 mL/min (ref >60)
Glucose, Bld: 114 mg/dL — ABNORMAL HIGH (ref 70–99)
Potassium: 4.2 mmol/L (ref 3.5–5.1)
Sodium: 143 mmol/L (ref 135–145)
Total Bilirubin: 0.7 mg/dL (ref 0.3–1.2)
Total Protein: 7 g/dL (ref 6.5–8.1)

## 2021-11-20 LAB — URINE DRUG SCREEN, QUALITATIVE (ARMC ONLY)
Amphetamines, Ur Screen: NOT DETECTED
Barbiturates, Ur Screen: NOT DETECTED
Benzodiazepine, Ur Scrn: NOT DETECTED
Cannabinoid 50 Ng, Ur ~~LOC~~: NOT DETECTED
Cocaine Metabolite,Ur ~~LOC~~: NOT DETECTED
MDMA (Ecstasy)Ur Screen: NOT DETECTED
Methadone Scn, Ur: NOT DETECTED
Opiate, Ur Screen: NOT DETECTED
Phencyclidine (PCP) Ur S: NOT DETECTED
Tricyclic, Ur Screen: POSITIVE — AB

## 2021-11-20 LAB — ETHANOL: Alcohol, Ethyl (B): <10 mg/dL (ref <10)

## 2021-11-20 LAB — ACETAMINOPHEN LEVEL: Acetaminophen (Tylenol), Serum: <10 ug/mL — ABNORMAL LOW (ref 10–30)

## 2021-11-20 LAB — SALICYLATE LEVEL: Salicylate Lvl: <7 mg/dL — ABNORMAL LOW (ref 7.0–30.0)
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
## 2021-11-20 NOTE — BH Assessment (Signed)
Comprehensive Clinical Assessment (CCA) Note  11/20/2021 Lance [name] [id number]  Chief Complaint: Patient is a 46 year old male presenting to [facility] ED voluntarily. Per triage note Pt states that he is here "for a lot of things- pt states he has been in a care home for the last 3 years and they are being aggressive towards him- pt states that he is suicidal because of it and that if they don't leave him alone he feels like he will hurt one of them- pt states he is diagnosed schizophrenia, pt states he has been taking his meds when they give them to him. During assessment patient appears alert and oriented x4, calm and cooperative. Patient reports "I'm having a hard time coping with the group home, I don't feel comfortable there's a lot of disrespect." Patient has a history of the same complaints with his group home and reports that he has expressed this to his ACT team. Patient is currently being managed by [facility] ACT team. Patient continues to report SI/HI.  Per Psyc NP Elenore Paddy patient to remain in the ED overnight and discharge in the morning Chief Complaint  Patient presents with   Mental Health Problem   Visit Diagnosis: Schizoaffective disorder, bipolar type    CCA Screening, Triage and Referral (STR)  Patient Reported Information How did you hear about [place]? Self  Referral name: No data recorded Referral phone number: No data recorded  Whom do you see for routine medical problems? No data recorded Practice/Facility Name: No data recorded Practice/Facility Phone Number: No data recorded Name of Contact: No data recorded Contact Number: No data recorded Contact Fax Number: No data recorded Prescriber Name: No data recorded Prescriber Address (if known): No data recorded  What Is the Reason for Your Visit/Call Today? Patient presents voluntarily due to SI and HI  How Long Has This Been Causing You Problems? > than 6 months  What Do You Feel Would Help You the Most  Today? Treatment for Depression or other mood problem   Have You Recently Been in Any Inpatient Treatment (Hospital/Detox/Crisis Center/28-Day Program)? No data recorded Name/Location of Program/Hospital:No data recorded How Long Were You There? No data recorded When Were You Discharged? No data recorded  Have You Ever Received Services From [facility] Before? No data recorded Who Do You See at [facility]? No data recorded  Have You Recently Had Any Thoughts About Hurting Yourself? Yes  Are You Planning to Commit Suicide/Harm Yourself At This time? No   Have you Recently Had Thoughts About Hurting Someone Karolee Ohs? Yes  Explanation: Patient did not disclose a particular plan   Have You Used Any Alcohol or Drugs in the Past 24 Hours? No  How Long Ago Did You Use Drugs or Alcohol? No data recorded What Did You Use and How Much? No data recorded  Do You Currently Have a Therapist/Psychiatrist? Yes  Name of Therapist/Psychiatrist: [facility] team   Have You Been Recently Discharged From Any Office Practice or Programs? No  Explanation of Discharge From Practice/Program: No data recorded    CCA Screening Triage Referral Assessment Type of Contact: Face-to-Face  Is this Initial or Reassessment? No data recorded Date Telepsych consult ordered in [facility]:  No data recorded Time Telepsych consult ordered in [facility]:  No data recorded  Patient Reported Information Reviewed? No data recorded Patient Left Without Being Seen? No data recorded Reason for Not Completing Assessment: No data recorded  Collateral Involvement: None   Does Patient Have a  Court Appointed Legal Guardian? No data recorded Name and Contact of Legal Guardian: No data recorded If Minor and Not Living with Parent(s), Who has Custody? n/a  Is CPS involved or ever been involved? Never  Is APS involved or ever been involved? Never   Patient Determined To Be At Risk for Harm To Self or Others Based on Review  of Patient Reported Information or Presenting Complaint? Yes, for Self-Harm  Method: No Plan  Availability of Means: No access or NA  Intent: Intends to cause physical harm but not necessarily death  Notification Required: -- (Patient recommended for inpatient)  Additional Information for Danger to Others Potential: No data recorded Additional Comments for Danger to Others Potential: No data recorded Are There Guns or Other Weapons in Your Home? No  Types of Guns/Weapons: No data recorded Are These Weapons Safely Secured?                            No data recorded Who Could Verify You Are Able To Have These Secured: No data recorded Do You Have any Outstanding Charges, Pending Court Dates, Parole/Probation? No data recorded Contacted To Inform of Risk of Harm To Self or Others: -- (n/a)   Location of Assessment: [facility] ED   Does Patient Present under Involuntary Commitment? No  IVC Papers Initial File Date: No data recorded  [place] of Residence: [place]   Patient Currently Receiving the Following Services: ACTT Psychologist, educational); Medication Management   Determination of Need: Emergent (2 hours)   Options For Referral: Therapeutic Triage Services; ED Referral     CCA Biopsychosocial Intake/Chief Complaint:  No data recorded Current Symptoms/Problems: No data recorded  Patient Reported Schizophrenia/Schizoaffective Diagnosis in Past: Yes   Strengths: Patient is able to communicate;pt has stable housing  Preferences: No data recorded Abilities: No data recorded  Type of Services Patient Feels are Needed: No data recorded  Initial Clinical Notes/Concerns: No data recorded  Mental Health Symptoms Depression:   Irritability; Difficulty Concentrating   Duration of Depressive symptoms:  Greater than two weeks   Mania:   Irritability   Anxiety:    Irritability; Restlessness; Worrying; Tension   Psychosis:   None   Duration of Psychotic  symptoms:  Less than six months   Trauma:   Hypervigilance   Obsessions:   Attempts to suppress/neutralize; Recurrent & persistent thoughts/impulses/images; Cause anxiety; Disrupts routine/functioning; Intrusive/time consuming; Poor insight   Compulsions:   Intended to reduce stress or prevent another outcome; Intrusive/time consuming; Repeated behaviors/mental acts; Disrupts with routine/functioning; "Driven" to perform behaviors/acts; Poor Insight   Inattention:   None   Hyperactivity/Impulsivity:   None   Oppositional/Defiant Behaviors:   None   Emotional Irregularity:   Recurrent suicidal behaviors/gestures/threats; Transient, stress-related paranoia/disassociation   Other Mood/Personality Symptoms:  No data recorded   Mental Status Exam Appearance and self-care  Stature:   Average   Weight:   Average weight   Clothing:   Age-appropriate   Grooming:   Normal   Cosmetic use:   None   Posture/gait:   Normal   Motor activity:   Agitated   Sensorium  Attention:   Normal   Concentration:   Preoccupied   Orientation:   Situation; Place; Person; Object   Recall/memory:   Normal   Affect and Mood  Affect:   Anxious   Mood:   Irritable   Relating  Eye contact:   Normal   Facial expression:  Responsive; Tense   Attitude toward examiner:   Irritable; Manipulative   Thought and Language  Speech flow:  Clear and Coherent   Thought content:   Appropriate to Mood and Circumstances   Preoccupation:   Ruminations   Hallucinations:   Visual   Organization:  No data recorded  Affiliated Computer ServicesExecutive Functions  Fund of Knowledge:   Fair   Intelligence:   Average   Abstraction:   Normal   Judgement:   Fair   Dance movement psychotherapisteality Testing:   Variable   Insight:   Fair   Decision Making:   Normal   Social Functioning  Social Maturity:   Isolates   Social Judgement:   Victimized   Stress  Stressors:   Housing   Coping Ability:    Exhausted   Skill Deficits:   Interpersonal   Supports:   Friends/Service system     Religion: Religion/Spirituality Are You A Religious Person?: No How Might This Affect Treatment?: n/a  Leisure/Recreation: Leisure / Recreation Do You Have Hobbies?: No  Exercise/Diet: Exercise/Diet Do You Exercise?: No Have You Gained or Lost A Significant Amount of Weight in the Past Six Months?: No Do You Follow a Special Diet?: No Do You Have Any Trouble Sleeping?: No   CCA Employment/Education Employment/Work Situation: Employment / Work Systems developerituation Employment Situation: On disability Why is Patient on Disability: Mental Health How Long has Patient Been on Disability: Unknown Patient's Job has Been Impacted by Current Illness: No Has Patient ever Been in the [employer] BancorpMilitary?: No  Education: Education Did Theme park managerYou Attend College?: No Did You Have An Individualized Education Program (IIEP): No Did You Have Any Difficulty At School?: No Patient's Education Has Been Impacted by Current Illness: No   CCA Family/Childhood History Family and Relationship History: Family history Marital status: Single Does patient have children?: No  Childhood History:  Childhood History By whom was/is the patient raised?: Both parents Did patient suffer any verbal/emotional/physical/sexual abuse as a child?: No Did patient suffer from severe childhood neglect?: No Has patient ever been sexually abused/assaulted/raped as an adolescent or adult?: No Was the patient ever a victim of a crime or a disaster?: No Witnessed domestic violence?: No Has patient been affected by domestic violence as an adult?: No  Child/Adolescent Assessment:     CCA Substance Use Alcohol/Drug Use: Alcohol / Drug Use Pain Medications: See MAR Prescriptions: See MAR Over the Counter: See MAR History of alcohol / drug use?: No history of alcohol / drug abuse Longest period of sobriety (when/how long): n/a Negative  Consequences of Use:  (Reports of none) Withdrawal Symptoms:  (Reports of none)                         ASAM's:  Six Dimensions of Multidimensional Assessment  Dimension 1:  Acute Intoxication and/or Withdrawal Potential:      Dimension 2:  Biomedical Conditions and Complications:      Dimension 3:  Emotional, Behavioral, or Cognitive Conditions and Complications:     Dimension 4:  Readiness to Change:     Dimension 5:  Relapse, Continued use, or Continued Problem Potential:     Dimension 6:  Recovery/Living Environment:     ASAM Severity Score:    ASAM Recommended Level of Treatment:     Substance use Disorder (SUD)    Recommendations for Services/Supports/Treatments:    DSM5 Diagnoses: Patient Active Problem List   Diagnosis Date Noted   Homicidal thoughts    Suicidal ideation  Alcohol abuse 04/12/2018   Tobacco use disorder 09/01/2016   Schizoaffective disorder, bipolar type (HCC) 05/22/2016   Diabetes mellitus without complication (HCC) 05/21/2016   Hyperlipidemia 05/21/2016   History of pulmonary embolism 05/21/2016   Chronic anticoagulation 05/21/2016   Hypertension 09/25/2015    Patient Centered Plan: Patient is on the following Treatment Plan(s):  Anxiety and Impulse Control   Referrals to Alternative Service(s): Referred to Alternative Service(s):   Place:   Date:   Time:    Referred to Alternative Service(s):   Place:   Date:   Time:    Referred to Alternative Service(s):   Place:   Date:   Time:    Referred to Alternative Service(s):   Place:   Date:   Time:      @BHCOLLABOFCARE @  , [name]-A

## 2021-11-20 NOTE — ED Notes (Signed)

## 2021-11-20 NOTE — ED Notes (Signed)
Pt received snack and drink 

## 2021-11-20 NOTE — ED Notes (Signed)
Pt. Alert and oriented, warm and dry, in no distress. Pt. Denies HI, and AVH. Pt states having SI without a plan. Patient contracts for safety.Pt. Encouraged to let nursing staff know of any concerns or needs.  

## 2021-11-20 NOTE — ED Notes (Signed)
Pt dressed out into hospital scrubs, belongings to include:  1 pair black shoes 1 pair black socks 1 black coat 1 grey hat 1 black necklace 1 green necklace 1 green bracelet 1 plaid shirt 1 blue t shirt 1 Kuhnle belt 1 pair jeans 1 pair blue underwear 1 blue duffel bag

## 2021-11-21 DIAGNOSIS — F250 Schizoaffective disorder, bipolar type: Secondary | ICD-10-CM | POA: Diagnosis not present

## 2021-11-21 LAB — LITHIUM LEVEL: Lithium Lvl: 0.68 mmol/L (ref 0.60–1.20)

## 2021-11-21 NOTE — ED Notes (Signed)
Called "[facility]" to come pick up patient. Staff member said there is no one there to leave to come get him, just her. States she will call to see if someone else can get him, but has no idea when it will be.

## 2021-11-21 NOTE — ED Notes (Signed)
Pt given lunch tray and chocolate milk.

## 2021-11-21 NOTE — ED Provider Notes (Signed)
Emergency Medicine Observation Re-evaluation Note  Lance [name] is a 46 y.o. male, seen on rounds today.  Pt initially presented to the ED for complaints of Mental Health Problem Currently, the patient is resting.  Physical Exam  BP 125/76   Pulse 79   Temp 97.9 F (36.6 C) (Oral)   Resp 19   Ht 5' 10.5" (1.791 m)   Wt 90.7 kg   SpO2 97%   BMI 28.29 kg/m  Physical Exam Gen: No acute distress  Resp: Normal rise and fall of chest Neuro: Moving all four extremities Psych: Resting currently, calm and cooperative when awake    ED Course / MDM  EKG:   I have reviewed the labs performed to date as well as medications administered while in observation.  Recent changes in the last 24 hours include no acute events overnight.  Plan  Current plan is for likely discharge back to group home in the morning.  Lance [name] is not under involuntary commitment.     Lance [name], Layla Maw, DO 11/21/21 [phone]

## 2021-11-21 NOTE — ED Provider Notes (Signed)
-----------------------------------------   9:42 AM on 11/21/2021 -----------------------------------------  Per psychiatry NP Shaune Pollack, the patient is cleared for discharge back to his group home.  Return precautions have been provided.  The patient is stable for discharge at this time.   Dionne Bucy, MD 11/21/21 [phone]

## 2021-11-21 NOTE — ED Notes (Signed)
VOL/Plan to observe overnight & D/C in Am

## 2021-11-22 ENCOUNTER — Other Ambulatory Visit: Payer: Self-pay

## 2021-11-22 ENCOUNTER — Emergency Department
Admission: EM | Admit: 2021-11-22 | Discharge: 2021-11-23 | Disposition: A | Discharge status: Home / Self Care | Payer: Medicare Other | Attending: Emergency Medicine | Admitting: Emergency Medicine

## 2021-11-22 DIAGNOSIS — G4700 Insomnia, unspecified: Secondary | ICD-10-CM | POA: Insufficient documentation

## 2021-11-22 DIAGNOSIS — F17210 Nicotine dependence, cigarettes, uncomplicated: Secondary | ICD-10-CM | POA: Diagnosis not present

## 2021-11-22 DIAGNOSIS — R110 Nausea: Secondary | ICD-10-CM | POA: Insufficient documentation

## 2021-11-22 DIAGNOSIS — F209 Schizophrenia, unspecified: Secondary | ICD-10-CM | POA: Diagnosis not present

## 2021-11-22 DIAGNOSIS — Z20822 Contact with and (suspected) exposure to covid-19: Secondary | ICD-10-CM | POA: Diagnosis not present

## 2021-11-22 DIAGNOSIS — Z7984 Long term (current) use of oral hypoglycemic drugs: Secondary | ICD-10-CM | POA: Diagnosis not present

## 2021-11-22 DIAGNOSIS — E119 Type 2 diabetes mellitus without complications: Secondary | ICD-10-CM | POA: Diagnosis not present

## 2021-11-22 DIAGNOSIS — F203 Undifferentiated schizophrenia: Secondary | ICD-10-CM | POA: Diagnosis not present

## 2021-11-22 DIAGNOSIS — Z79899 Other long term (current) drug therapy: Secondary | ICD-10-CM | POA: Insufficient documentation

## 2021-11-22 DIAGNOSIS — F250 Schizoaffective disorder, bipolar type: Secondary | ICD-10-CM | POA: Insufficient documentation

## 2021-11-22 DIAGNOSIS — F319 Bipolar disorder, unspecified: Secondary | ICD-10-CM | POA: Diagnosis present

## 2021-11-22 DIAGNOSIS — I10 Essential (primary) hypertension: Secondary | ICD-10-CM | POA: Diagnosis not present

## 2021-11-22 LAB — CBC WITH DIFFERENTIAL/PLATELET
Abs Immature Granulocytes: 0.02 10*3/uL (ref 0.00–0.07)
Basophils Absolute: 0 10*3/uL (ref 0.0–0.1)
Basophils Relative: 0 %
Eosinophils Absolute: 0 10*3/uL (ref 0.0–0.5)
Eosinophils Relative: 0 %
HCT: 43.5 % (ref 39.0–52.0)
Hemoglobin: 14.9 g/dL (ref 13.0–17.0)
Immature Granulocytes: 0 %
Lymphocytes Relative: 14 %
Lymphs Abs: 1.3 10*3/uL (ref 0.7–4.0)
MCH: 30.5 pg (ref 26.0–34.0)
MCHC: 34.3 g/dL (ref 30.0–36.0)
MCV: 89.1 fL (ref 80.0–100.0)
Monocytes Absolute: 0.8 10*3/uL (ref 0.1–1.0)
Monocytes Relative: 9 %
Neutro Abs: 6.8 10*3/uL (ref 1.7–7.7)
Neutrophils Relative %: 77 %
Platelets: 282 10*3/uL (ref 150–400)
RBC: 4.88 MIL/uL (ref 4.22–5.81)
RDW: 12.8 % (ref 11.5–15.5)
WBC: 8.9 10*3/uL (ref 4.0–10.5)
nRBC: 0 % (ref 0.0–0.2)

## 2021-11-22 LAB — COMPREHENSIVE METABOLIC PANEL
ALT: 23 U/L (ref 0–44)
AST: 29 U/L (ref 15–41)
Albumin: 4.9 g/dL (ref 3.5–5.0)
Alkaline Phosphatase: 72 U/L (ref 38–126)
Anion gap: 12 (ref 5–15)
BUN: 29 mg/dL — ABNORMAL HIGH (ref 6–20)
CO2: 20 mmol/L — ABNORMAL LOW (ref 22–32)
Calcium: 10.6 mg/dL — ABNORMAL HIGH (ref 8.9–10.3)
Chloride: 109 mmol/L (ref 98–111)
Creatinine, Ser: 1.19 mg/dL (ref 0.61–1.24)
GFR, Estimated: >60 mL/min (ref >60)
Glucose, Bld: 144 mg/dL — ABNORMAL HIGH (ref 70–99)
Potassium: 4.2 mmol/L (ref 3.5–5.1)
Sodium: 141 mmol/L (ref 135–145)
Total Bilirubin: 1.2 mg/dL (ref 0.3–1.2)
Total Protein: 7.2 g/dL (ref 6.5–8.1)

## 2021-11-22 LAB — ACETAMINOPHEN LEVEL: Acetaminophen (Tylenol), Serum: <10 ug/mL — ABNORMAL LOW (ref 10–30)

## 2021-11-22 LAB — ETHANOL: Alcohol, Ethyl (B): <10 mg/dL (ref <10)

## 2021-11-22 LAB — RESP PANEL BY RT-PCR (FLU A&B, COVID) ARPGX2
Influenza A by PCR: NEGATIVE
Influenza B by PCR: NEGATIVE
SARS Coronavirus 2 by RT PCR: NEGATIVE

## 2021-11-22 LAB — SALICYLATE LEVEL: Salicylate Lvl: <7 mg/dL — ABNORMAL LOW (ref 7.0–30.0)

## 2021-11-22 LAB — LITHIUM LEVEL: Lithium Lvl: 0.28 mmol/L — ABNORMAL LOW (ref 0.60–1.20)

## 2021-11-22 MED ORDER — TRAZODONE HCL 100 MG PO TABS
100.0000 mg | ORAL_TABLET | Freq: Every day | ORAL | Status: DC
  Administered 2021-11-22: 100 mg via ORAL
  Filled 2021-11-22: qty 1

## 2021-11-22 MED ORDER — ACETAMINOPHEN 325 MG PO TABS: 650.0000 mg | ORAL_TABLET | Freq: Three times a day (TID) | ORAL | Status: DC | PRN

## 2021-11-22 MED ORDER — HYDROXYZINE HCL 25 MG PO TABS
100.0000 mg | ORAL_TABLET | Freq: Every day | ORAL | Status: DC
  Administered 2021-11-22: 100 mg via ORAL
  Filled 2021-11-22: qty 4

## 2021-11-22 MED ORDER — LITHIUM CARBONATE ER 300 MG PO TBCR
300.0000 mg | EXTENDED_RELEASE_TABLET | Freq: Every morning | ORAL | Status: DC
  Administered 2021-11-23: 300 mg via ORAL
  Filled 2021-11-22 (×2): qty 1

## 2021-11-22 MED ORDER — LITHIUM CARBONATE ER 450 MG PO TBCR
450.0000 mg | EXTENDED_RELEASE_TABLET | Freq: Every day | ORAL | Status: DC
  Administered 2021-11-22: 450 mg via ORAL
  Filled 2021-11-22: qty 1

## 2021-11-22 MED ORDER — CLOZAPINE 25 MG PO TABS
25.0000 mg | ORAL_TABLET | Freq: Every day | ORAL | Status: DC
  Administered 2021-11-22: 25 mg via ORAL
  Filled 2021-11-22: qty 1

## 2021-11-22 MED ORDER — METFORMIN HCL 500 MG PO TABS
1000.0000 mg | ORAL_TABLET | Freq: Two times a day (BID) | ORAL | Status: DC
  Administered 2021-11-22 – 2021-11-23 (×2): 1000 mg via ORAL
  Filled 2021-11-22 (×2): qty 2

## 2021-11-22 MED ORDER — APIXABAN 5 MG PO TABS
5.0000 mg | ORAL_TABLET | Freq: Two times a day (BID) | ORAL | Status: DC
  Administered 2021-11-22 – 2021-11-23 (×2): 5 mg via ORAL
  Filled 2021-11-22 (×2): qty 1

## 2021-11-22 MED ORDER — CLOZAPINE 100 MG PO TABS: 500.0000 mg | ORAL_TABLET | Freq: Every day | ORAL | Status: DC
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
## 2021-11-22 NOTE — ED Notes (Signed)
Patient reports that he has not taken his medications in several days.  Patient also reports having auditory hallucinations -- Lance [name] talking to him about doing bad things to every body and that he has not slept in several days.

## 2021-11-22 NOTE — ED Notes (Signed)
Pt received snack and drink 

## 2021-11-22 NOTE — ED Triage Notes (Signed)
Pt via POV from home. Pt c/o insomnia for the past 3 days, states "satan is attacking my body" Denies SI/HI states "I love myself" asked about AVH pt states "I'm just sick" States he has been sleeping in the woods. Pt has a hx of schizophrenia and states that he is feeling like her has been going through withdrawals because he hasn't had any medications for 3-4 days. Pt is calm and cooperative. Denies ETOH. Pt last used marijuana yesterday. Pt is A&OX4 and NAD  Belongings include (bags 2) 1 pair of black socks   1 pair of black shoes  1 pair of blue jeans  1 black belt  1 blue boxers 1 gray hat  1 blue t-shirt  1 blue flannel shirt  1 red flannel shirt  1 black jacket  1 black bag

## 2021-11-22 NOTE — ED Provider Notes (Signed)
   [facility] Provider Note    Event Date/Time   First MD Initiated Contact with Patient 11/22/21 1916     (approximate)  History   Chief Complaint: Psychiatric Evaluation  HPI  Lance [name] is a 46 y.o. male with a past medical history of diabetes, hypertension, schizophrenia, presents to the emergency department with complaints of insomnia, having bad thoughts not feeling well.  According to the patient over the past 3 to 4 days he has been living on the streets because he was having an argument with someone at his group home.  States he has not been sleeping as he has not had any of his medication in 4 days.  States he is suffering from insomnia.  States at times he is having bad thoughts but denies any SI or HI.  Patient is requesting help and wants to see a psychiatrist.  Patient's only medical complaint is nausea.  Physical Exam   Triage Vital Signs: ED Triage Vitals  Enc Vitals Group     BP 11/22/21 1855 112/65     Pulse Rate 11/22/21 1855 81     Resp 11/22/21 1855 20     Temp 11/22/21 1855 98.3 F (36.8 C)     Temp src --      SpO2 11/22/21 1855 95 %     Weight 11/22/21 1849 199 lb 8.3 oz (90.5 kg)     Height 11/22/21 1849 5' 10.5" (1.791 m)     Head Circumference --      Peak Flow --      Pain Score 11/22/21 1849 0     Pain Loc --      Pain Edu? --      Excl. in GC? --     Most recent vital signs: Vitals:   11/22/21 1855  BP: 112/65  Pulse: 81  Resp: 20  Temp: 98.3 F (36.8 C)  SpO2: 95%    General: Awake, no distress.  CV:  Good peripheral perfusion.  Regular rate and rhythm  Resp:  Normal effort.  Equal breath sounds bilaterally.  Abd:  No distention.  Soft, nontender.  No rebound or guarding.    ED Results / Procedures / Treatments   MEDICATIONS ORDERED IN ED: Medications - No data to display   IMPRESSION / MDM / ASSESSMENT AND PLAN / ED COURSE  I reviewed the triage vital signs and the nursing notes.  Patient's  presentation is most consistent with acute presentation with potential threat to life or bodily function.  Patient presents to the emergency department with insomnia, has been living on the streets for the past 4 days and has not had any of his medications.  No SI or HI.  We will have psychiatry and TTS evaluate.  Does not appear to meet IVC criteria currently.  We will check labs and dose the patient's nighttime medications.  Per record review patient was seen in the emergency department yesterday.  Lab work shows a reassuring chemistry, 0.28 lithium level.  Negative salicylate acetaminophen and alcohol levels.  Normal CBC.  Psychiatric evaluation pending.  FINAL CLINICAL IMPRESSION(S) / ED DIAGNOSES   Insomnia Schizophrenia   Note:  This document was prepared using Dragon voice recognition software and may include unintentional dictation errors.   Minna Antis, MD 11/22/21 2242

## 2021-11-22 NOTE — ED Notes (Signed)
Psych NP speaking with patient 

## 2021-11-22 NOTE — Consult Note (Signed)
Pharmacy Consult - Clozapine     46 yo male ordered clozapine 25 mg PO qHS  This patient's order has been reviewed for prescribing contraindications.    Clozapine REMS enrollment Verified: yes  REMS patient ID: REMS ID [id number] Current Outpatient Monitoring: Monthly    Home Regimen:  500 mg PO qHS Last dose: 11/18/2021   Dose Adjustments This Admission: Will reinitiate clozapine at 25mg  po qHS   Labs: Date    ANC    Submitted? 6/10 6.8 yes     Plan: Clozapine 25mg  qHS, titrate to PTA dose Monitor ANC at least weekly while inpatient   For general population patients, i.e., those without benign ethnic neutropenia (BEN): --If ANC 1000-1499, increase ANC monitoring to 3x/wk --If ANC < 1000, HOLD CLOZAPINE and get psych consult   For patients with BEN: --If [place], increase ANC monitoring to 3x/wk --If ANC < 500, HOLD CLOZAPINE and get psych consult

## 2021-11-23 DIAGNOSIS — G4700 Insomnia, unspecified: Secondary | ICD-10-CM | POA: Diagnosis not present

## 2021-11-23 DIAGNOSIS — F209 Schizophrenia, unspecified: Secondary | ICD-10-CM

## 2021-11-23 DIAGNOSIS — F250 Schizoaffective disorder, bipolar type: Secondary | ICD-10-CM | POA: Diagnosis not present

## 2021-11-23 NOTE — ED Notes (Signed)
Per group home, someone will be here to pickup pt in about 30 minutes

## 2021-11-23 NOTE — Discharge Instructions (Addendum)

## 2021-11-23 NOTE — ED Provider Notes (Signed)
-----------------------------------------   7:27 AM on 11/23/2021 -----------------------------------------   Blood pressure 112/65, pulse 81, temperature 98.3 F (36.8 C), resp. rate 20, height 5' 10.5" (1.791 m), weight 90.5 kg, SpO2 95 %.  Resting in bed, when asked if he has any concerns reports none.  Resting in bed.  Normal work of breathing.  Calm, appropriate demeanor.  The patient is calm and resting.  Reports no concerns there have been no acute events since the last update.  Awaiting disposition plan from Behavioral Medicine team which includes "overnight observation" per Artis Delay NP    Sharyn Creamer, MD 11/23/21 [phone]

## 2021-11-23 NOTE — Consult Note (Signed)
Client continues to be at his baseline.  No longer upset with his group home.  He would like a new one and his guardian reports he needs to let his ACT team, [facility] know of this request.  She requests he return to the group home as he has no suicidal/homicidal ideations, hallucinations, or substance abuse.  Nanine Means, PMHNP

## 2021-11-23 NOTE — ED Notes (Signed)
Pt. Transferred to [facility] from ED to room 4 after screening for contraband. Report to include Situation, Background, Assessment and Recommendations from RN. Pt. Oriented to unit including Q15 minute rounds as well as the security cameras for their protection. Patient is alert and oriented, warm and dry in no acute distress. Patient denies SI, HI, and AVH. Pt. Encouraged to let me know if needs arise.  

## 2021-11-23 NOTE — ED Notes (Signed)
Per Asher Muir, NP, pt will be discharged back to group home. They will pickup after church

## 2021-11-23 NOTE — ED Notes (Signed)
Called group home, they are having trouble finding a ride for the pt. Wanted me to call back in a few minutes.

## 2021-11-23 NOTE — ED Provider Notes (Signed)
Per psychiatry team, Lance [name]: spoke with the patient's guardian, patient is psychiatrically cleared, may return to his group home. [facility] home [facility] will pick him up shortly. DC planned into care of group home   Sharyn Creamer, MD 11/23/21 1024

## 2021-11-23 NOTE — ED Notes (Signed)
Pt given shower 

## 2021-11-23 NOTE — ED Notes (Signed)
Breakfast tray given. °

## 2021-11-25 ENCOUNTER — Ambulatory Visit: Payer: Medicare Other | Admitting: Internal Medicine

## 2021-11-25 ENCOUNTER — Telehealth: Payer: Self-pay | Admitting: *Deleted

## 2021-11-25 NOTE — Telephone Encounter (Signed)
Transition Care Management Follow-up Telephone Call Date of discharge and from where: 11/22/2021 How have you been since you were released from the hospital? [facility] Any questions or concerns? No  Items Reviewed: Did the pt receive and understand the discharge instructions provided? Yes  Medications obtained and verified? Yes  Other? No  Any new allergies since your discharge? No  Dietary orders reviewed? Yes Do you have support at home? Yes   Home Care and Equipment/Supplies: Were home health services ordered? no If so, what is the name of the agency? NA  Has the agency set up a time to come to the patient's home? not applicable Were any new equipment or medical supplies ordered?  No What is the name of the medical supply agency? NA Were you able to get the supplies/equipment? not applicable Do you have any questions related to the use of the equipment or supplies? No  Functional Questionnaire: (I = Independent and D = Dependent) ADLs: D  Bathing/Dressing- I  Meal Prep- D  Eating- I  Maintaining continence- D  Transferring/Ambulation- I  Managing Meds- I  Follow up appointments reviewed:  PCP Hospital f/u appt confirmed? Yes  Scheduled to see Cletis Athens  on 11/21/2021 Specialist Hospital f/u appt confirmed? NA Are transportation arrangements needed? No  If their condition worsens, is the pt aware to call PCP or go to the Emergency Dept.? Yes Was the patient provided with contact information for the PCP's office or ED? Yes Was to pt encouraged to call back with questions or concerns? Yes

## 2021-12-05 ENCOUNTER — Other Ambulatory Visit: Payer: Self-pay | Admitting: Internal Medicine

## 2021-12-17 ENCOUNTER — Other Ambulatory Visit: Payer: Self-pay | Admitting: *Deleted

## 2021-12-17 MED ORDER — LISINOPRIL 2.5 MG PO TABS: 2.5000 mg | ORAL_TABLET | Freq: Every morning | ORAL | 4 refills | Status: DC

## 2021-12-17 MED ORDER — APIXABAN 5 MG PO TABS: 5.0000 mg | ORAL_TABLET | Freq: Two times a day (BID) | ORAL | 6 refills | Status: DC

## 2022-02-10 NOTE — Congregational Nurse Program (Signed)
  Dept: [phone]   Congregational Nurse Program Note  Date of Encounter: 02/10/2022 Client to [facility] clinic with request for vital sign check.  BP 118/86 (BP Location: Left Arm, Patient Position: Sitting, Cuff Size: Normal)   Pulse (!) 110  He lives in a boarding house and comes to [employer] [facility] for some socialization and coffee. No needs or concerns today. Emotional support given.   Past Medical History: Past Medical History:  Diagnosis Date   Depression    Diabetes mellitus without complication (HCC)    Hyperlipidemia    Hypertension    Lupus anticoagulant disorder (HCC) 06/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   PE (pulmonary thromboembolism) (HCC)    Schizo affective schizophrenia (HCC)    Supratherapeutic INR 11/19/2016    Encounter Details:  CNP Questionnaire - 02/10/22 1140       Questionnaire   Do you give verbal consent to treat you today? Yes    Location Patient Served  [facility]    Visit Setting Church or Organization    Patient Status Unknown   client lives in a group home   Insurance Medicaid;Medicare    Insurance Referral N/A    Medication N/A    Medical Provider Yes    Screening Referrals N/A    Medical Referral N/A    Medical Appointment Made N/A    Food N/A    Transportation N/A    Housing/Utilities N/A   client lives in a group home   Interpersonal Safety N/A    Intervention Blood pressure;Support    ED Visit Averted N/A    Life-Saving Intervention Made N/A

## 2022-03-05 ENCOUNTER — Ambulatory Visit (INDEPENDENT_AMBULATORY_CARE_PROVIDER_SITE_OTHER): Payer: Medicare Other | Admitting: Podiatry

## 2022-03-05 ENCOUNTER — Encounter: Payer: Self-pay | Admitting: Podiatry

## 2022-03-05 DIAGNOSIS — Z7901 Long term (current) use of anticoagulants: Secondary | ICD-10-CM

## 2022-03-05 DIAGNOSIS — B351 Tinea unguium: Secondary | ICD-10-CM | POA: Diagnosis not present

## 2022-03-05 DIAGNOSIS — D6862 Lupus anticoagulant syndrome: Secondary | ICD-10-CM | POA: Insufficient documentation

## 2022-03-05 DIAGNOSIS — Q828 Other specified congenital malformations of skin: Secondary | ICD-10-CM

## 2022-03-05 DIAGNOSIS — M79676 Pain in unspecified toe(s): Secondary | ICD-10-CM | POA: Diagnosis not present

## 2022-03-05 DIAGNOSIS — D689 Coagulation defect, unspecified: Secondary | ICD-10-CM | POA: Diagnosis not present

## 2022-03-05 NOTE — Progress Notes (Signed)
This patient returns to my office for at risk foot care.  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  Patient is taking eliquiss.  . This patient is unable to cut nails himself since the patient cannot reach his nails.These nails are painful walking and wearing shoes.  This patient presents for at risk foot care today.  General Appearance  Alert, conversant and in no acute stress.  Vascular  Dorsalis pedis and posterior tibial  pulses are palpable  bilaterally.  Capillary return is within normal limits  bilaterally. Temperature is within normal limits  bilaterally.  Neurologic  Senn-Weinstein monofilament wire test within normal limits  bilaterally. Muscle power within normal limits bilaterally.  Nails Thick disfigured discolored nails with subungual debris  hallux right foot.No evidence of bacterial infection or drainage bilaterally. Nail spicules noted hallux nails  B/L.  Orthopedic  No limitations of motion  feet .  No crepitus or effusions noted.  No bony pathology or digital deformities noted.  Skin  normotropic skin  noted bilaterally.  No signs of infections or ulcers noted.  Porokeratosis sub 4 right foot.   Onychomycosis  Pain in right toes   Porokeratosis sub 4th met right foot.   Consent was obtained for treatment procedures.   Mechanical debridement of nails performed with a nail nipper.  Filed with dremel without incident. Debride porokeratosis with # 15 blade.   Return office visit   prn                  Told patient to return for periodic foot care and evaluation due to potential at risk complications.   Gardiner Barefoot DPM

## 2022-03-27 ENCOUNTER — Other Ambulatory Visit: Payer: Self-pay | Admitting: Internal Medicine

## 2022-04-26 ENCOUNTER — Emergency Department (EMERGENCY_DEPARTMENT_HOSPITAL)
Admission: EM | Admit: 2022-04-26 | Discharge: 2022-04-27 | Disposition: A | Discharge status: Other Acute Inpatient Hospital | Payer: Medicare Other | Source: Home / Self Care | Attending: Emergency Medicine | Admitting: Emergency Medicine

## 2022-04-26 ENCOUNTER — Other Ambulatory Visit: Payer: Self-pay

## 2022-04-26 ENCOUNTER — Emergency Department: Payer: Medicare Other

## 2022-04-26 DIAGNOSIS — F23 Brief psychotic disorder: Secondary | ICD-10-CM

## 2022-04-26 DIAGNOSIS — S62316A Displaced fracture of base of fifth metacarpal bone, right hand, initial encounter for closed fracture: Secondary | ICD-10-CM | POA: Insufficient documentation

## 2022-04-26 DIAGNOSIS — S62339A Displaced fracture of neck of unspecified metacarpal bone, initial encounter for closed fracture: Secondary | ICD-10-CM

## 2022-04-26 DIAGNOSIS — Z1339 Encounter for screening examination for other mental health and behavioral disorders: Secondary | ICD-10-CM | POA: Insufficient documentation

## 2022-04-26 DIAGNOSIS — F29 Unspecified psychosis not due to a substance or known physiological condition: Secondary | ICD-10-CM | POA: Insufficient documentation

## 2022-04-26 DIAGNOSIS — R441 Visual hallucinations: Secondary | ICD-10-CM | POA: Diagnosis not present

## 2022-04-26 DIAGNOSIS — F203 Undifferentiated schizophrenia: Secondary | ICD-10-CM | POA: Insufficient documentation

## 2022-04-26 DIAGNOSIS — Z1152 Encounter for screening for COVID-19: Secondary | ICD-10-CM | POA: Insufficient documentation

## 2022-04-26 DIAGNOSIS — Y92199 Unspecified place in other specified residential institution as the place of occurrence of the external cause: Secondary | ICD-10-CM | POA: Insufficient documentation

## 2022-04-26 LAB — COMPREHENSIVE METABOLIC PANEL
ALT: 12 U/L (ref 0–44)
AST: 18 U/L (ref 15–41)
Albumin: 4.5 g/dL (ref 3.5–5.0)
Alkaline Phosphatase: 48 U/L (ref 38–126)
Anion gap: 6 (ref 5–15)
BUN: 22 mg/dL — ABNORMAL HIGH (ref 6–20)
CO2: 23 mmol/L (ref 22–32)
Calcium: 9.5 mg/dL (ref 8.9–10.3)
Chloride: 114 mmol/L — ABNORMAL HIGH (ref 98–111)
Creatinine, Ser: 1.37 mg/dL — ABNORMAL HIGH (ref 0.61–1.24)
GFR, Estimated: >60 mL/min (ref >60)
Glucose, Bld: 109 mg/dL — ABNORMAL HIGH (ref 70–99)
Potassium: 4.7 mmol/L (ref 3.5–5.1)
Sodium: 143 mmol/L (ref 135–145)
Total Bilirubin: 0.6 mg/dL (ref 0.3–1.2)
Total Protein: 7.1 g/dL (ref 6.5–8.1)

## 2022-04-26 LAB — URINE DRUG SCREEN, QUALITATIVE (ARMC ONLY)
Amphetamines, Ur Screen: NOT DETECTED
Barbiturates, Ur Screen: NOT DETECTED
Benzodiazepine, Ur Scrn: NOT DETECTED
Cannabinoid 50 Ng, Ur ~~LOC~~: NOT DETECTED
Cocaine Metabolite,Ur ~~LOC~~: NOT DETECTED
MDMA (Ecstasy)Ur Screen: NOT DETECTED
Methadone Scn, Ur: NOT DETECTED
Opiate, Ur Screen: NOT DETECTED
Phencyclidine (PCP) Ur S: NOT DETECTED
Tricyclic, Ur Screen: POSITIVE — AB

## 2022-04-26 LAB — RESP PANEL BY RT-PCR (FLU A&B, COVID) ARPGX2
Influenza A by PCR: NEGATIVE
Influenza B by PCR: NEGATIVE
SARS Coronavirus 2 by RT PCR: NEGATIVE

## 2022-04-26 LAB — CBC
HCT: 40.6 % (ref 39.0–52.0)
Hemoglobin: 13.9 g/dL (ref 13.0–17.0)
MCH: 30.8 pg (ref 26.0–34.0)
MCHC: 34.2 g/dL (ref 30.0–36.0)
MCV: 89.8 fL (ref 80.0–100.0)
Platelets: 225 10*3/uL (ref 150–400)
RBC: 4.52 MIL/uL (ref 4.22–5.81)
RDW: 12.6 % (ref 11.5–15.5)
WBC: 6.2 10*3/uL (ref 4.0–10.5)
nRBC: 0 % (ref 0.0–0.2)

## 2022-04-26 LAB — ACETAMINOPHEN LEVEL: Acetaminophen (Tylenol), Serum: <10 ug/mL — ABNORMAL LOW (ref 10–30)

## 2022-04-26 LAB — ETHANOL: Alcohol, Ethyl (B): <10 mg/dL (ref <10)

## 2022-04-26 LAB — SALICYLATE LEVEL: Salicylate Lvl: <7 mg/dL — ABNORMAL LOW (ref 7.0–30.0)

## 2022-04-26 NOTE — ED Notes (Addendum)
Pt dressed out into burgundy scrubs with this tech, Kathryne Hitch, Charity fundraiser, and [employer] in the rm. Pt belongings consist of: one Nudelman belt, black jeans, red boxers, a blue shirt, white socks, black/red tennis shoes, a blue t-shirt, a tan jacket, five rubber bracelets placed inside of pts shoes, a Bannister paper bag with a black flashlight pen, a lighter, and a pack of cigarettes inside. Pt belongings placed inside of twp pt belonging bags and labeled with pt name. Pt calm and cooperative while dressing out.  Pt has a knife locked up in the safe in triage lobby.
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
## 2022-04-27 ENCOUNTER — Encounter: Payer: Self-pay | Admitting: Psychiatric/Mental Health

## 2022-04-27 ENCOUNTER — Inpatient Hospital Stay
Admission: AD | Admit: 2022-04-27 | Discharge: 2022-05-05 | DRG: 885 | Disposition: A | Discharge status: Home / Self Care | Payer: Medicare Other | Source: Intra-hospital | Attending: Psychiatry | Admitting: Psychiatry

## 2022-04-27 DIAGNOSIS — Z86711 Personal history of pulmonary embolism: Secondary | ICD-10-CM | POA: Diagnosis not present

## 2022-04-27 DIAGNOSIS — F29 Unspecified psychosis not due to a substance or known physiological condition: Secondary | ICD-10-CM | POA: Diagnosis present

## 2022-04-27 DIAGNOSIS — E785 Hyperlipidemia, unspecified: Secondary | ICD-10-CM | POA: Diagnosis present

## 2022-04-27 DIAGNOSIS — Z794 Long term (current) use of insulin: Secondary | ICD-10-CM

## 2022-04-27 DIAGNOSIS — R441 Visual hallucinations: Secondary | ICD-10-CM | POA: Diagnosis present

## 2022-04-27 DIAGNOSIS — F17210 Nicotine dependence, cigarettes, uncomplicated: Secondary | ICD-10-CM | POA: Diagnosis present

## 2022-04-27 DIAGNOSIS — F203 Undifferentiated schizophrenia: Secondary | ICD-10-CM | POA: Diagnosis present

## 2022-04-27 DIAGNOSIS — Z880 Allergy status to penicillin: Secondary | ICD-10-CM

## 2022-04-27 DIAGNOSIS — F319 Bipolar disorder, unspecified: Secondary | ICD-10-CM | POA: Diagnosis present

## 2022-04-27 DIAGNOSIS — F23 Brief psychotic disorder: Secondary | ICD-10-CM

## 2022-04-27 DIAGNOSIS — S62339A Displaced fracture of neck of unspecified metacarpal bone, initial encounter for closed fracture: Secondary | ICD-10-CM | POA: Diagnosis present

## 2022-04-27 DIAGNOSIS — D6862 Lupus anticoagulant syndrome: Secondary | ICD-10-CM | POA: Diagnosis present

## 2022-04-27 DIAGNOSIS — Z79899 Other long term (current) drug therapy: Secondary | ICD-10-CM | POA: Diagnosis not present

## 2022-04-27 DIAGNOSIS — Z7984 Long term (current) use of oral hypoglycemic drugs: Secondary | ICD-10-CM

## 2022-04-27 DIAGNOSIS — I10 Essential (primary) hypertension: Secondary | ICD-10-CM | POA: Diagnosis present

## 2022-04-27 DIAGNOSIS — Z20822 Contact with and (suspected) exposure to covid-19: Secondary | ICD-10-CM | POA: Diagnosis present

## 2022-04-27 DIAGNOSIS — F250 Schizoaffective disorder, bipolar type: Secondary | ICD-10-CM | POA: Diagnosis present

## 2022-04-27 DIAGNOSIS — F17200 Nicotine dependence, unspecified, uncomplicated: Secondary | ICD-10-CM | POA: Diagnosis present

## 2022-04-27 DIAGNOSIS — F419 Anxiety disorder, unspecified: Secondary | ICD-10-CM | POA: Diagnosis present

## 2022-04-27 DIAGNOSIS — R45850 Homicidal ideations: Secondary | ICD-10-CM | POA: Diagnosis present

## 2022-04-27 DIAGNOSIS — E119 Type 2 diabetes mellitus without complications: Secondary | ICD-10-CM

## 2022-04-27 DIAGNOSIS — S62336A Displaced fracture of neck of fifth metacarpal bone, right hand, initial encounter for closed fracture: Secondary | ICD-10-CM | POA: Diagnosis present

## 2022-04-27 DIAGNOSIS — Z7901 Long term (current) use of anticoagulants: Secondary | ICD-10-CM

## 2022-04-27 DIAGNOSIS — F209 Schizophrenia, unspecified: Principal | ICD-10-CM | POA: Diagnosis present

## 2022-04-27 DIAGNOSIS — E7800 Pure hypercholesterolemia, unspecified: Secondary | ICD-10-CM | POA: Diagnosis present

## 2022-04-27 LAB — GLUCOSE, CAPILLARY
Glucose-Capillary: 180 mg/dL — ABNORMAL HIGH (ref 70–99)
Glucose-Capillary: 101 mg/dL — ABNORMAL HIGH (ref 70–99)

## 2022-04-27 MED ORDER — TRAZODONE HCL 100 MG PO TABS
100.0000 mg | ORAL_TABLET | Freq: Every evening | ORAL | Status: DC | PRN
  Administered 2022-04-27 – 2022-05-03 (×5): 100 mg via ORAL
  Filled 2022-04-27 (×5): qty 1

## 2022-04-27 MED ORDER — ACETAMINOPHEN 325 MG PO TABS
650.0000 mg | ORAL_TABLET | Freq: Four times a day (QID) | ORAL | Status: DC | PRN
  Administered 2022-04-28 – 2022-05-02 (×6): 650 mg via ORAL
  Filled 2022-04-27 (×7): qty 2

## 2022-04-27 MED ORDER — MAGNESIUM HYDROXIDE 400 MG/5ML PO SUSP: 30.0000 mL | Freq: Every day | ORAL | Status: DC | PRN

## 2022-04-27 MED ORDER — PROPRANOLOL HCL 20 MG PO TABS
20.0000 mg | ORAL_TABLET | Freq: Every day | ORAL | Status: DC
  Administered 2022-04-27 – 2022-05-04 (×6): 20 mg via ORAL
  Filled 2022-04-27 (×8): qty 1

## 2022-04-27 MED ORDER — TRAZODONE HCL 50 MG PO TABS: 50.0000 mg | ORAL_TABLET | Freq: Every evening | ORAL | Status: DC | PRN

## 2022-04-27 MED ORDER — APIXABAN 5 MG PO TABS
5.0000 mg | ORAL_TABLET | Freq: Two times a day (BID) | ORAL | Status: DC
  Administered 2022-04-27 – 2022-05-05 (×16): 5 mg via ORAL
  Filled 2022-04-27 (×17): qty 1

## 2022-04-27 MED ORDER — OLANZAPINE 10 MG PO TABS
20.0000 mg | ORAL_TABLET | Freq: Every day | ORAL | Status: DC
  Administered 2022-04-27 – 2022-05-04 (×8): 20 mg via ORAL
  Filled 2022-04-27 (×8): qty 2

## 2022-04-27 MED ORDER — SIMVASTATIN 40 MG PO TABS
40.0000 mg | ORAL_TABLET | Freq: Every day | ORAL | Status: DC
  Administered 2022-04-27 – 2022-05-04 (×8): 40 mg via ORAL
  Filled 2022-04-27 (×8): qty 1

## 2022-04-27 MED ORDER — CLOZAPINE 25 MG PO TABS
150.0000 mg | ORAL_TABLET | Freq: Every day | ORAL | Status: DC
  Administered 2022-04-27 – 2022-04-29 (×3): 150 mg via ORAL
  Filled 2022-04-27 (×3): qty 2

## 2022-04-27 MED ORDER — SERTRALINE HCL 100 MG PO TABS
100.0000 mg | ORAL_TABLET | Freq: Every day | ORAL | Status: DC
  Administered 2022-04-27 – 2022-05-05 (×9): 100 mg via ORAL
  Filled 2022-04-27 (×9): qty 1

## 2022-04-27 MED ORDER — LISINOPRIL 5 MG PO TABS
2.5000 mg | ORAL_TABLET | Freq: Every day | ORAL | Status: DC
  Administered 2022-04-27 – 2022-05-05 (×8): 2.5 mg via ORAL
  Filled 2022-04-27 (×8): qty 1

## 2022-04-27 MED ORDER — LITHIUM CARBONATE 300 MG PO CAPS
450.0000 mg | ORAL_CAPSULE | Freq: Every day | ORAL | Status: DC
  Administered 2022-04-27 – 2022-04-28 (×2): 450 mg via ORAL
  Filled 2022-04-27 (×2): qty 1

## 2022-04-27 MED ORDER — LITHIUM CARBONATE 300 MG PO CAPS
300.0000 mg | ORAL_CAPSULE | Freq: Every morning | ORAL | Status: DC
  Administered 2022-04-28: 300 mg via ORAL
  Filled 2022-04-27 (×2): qty 1

## 2022-04-27 MED ORDER — INSULIN DETEMIR 100 UNIT/ML ~~LOC~~ SOLN
10.0000 [IU] | Freq: Two times a day (BID) | SUBCUTANEOUS | Status: DC
  Administered 2022-04-27 – 2022-05-05 (×16): 10 [IU] via SUBCUTANEOUS
  Filled 2022-04-27 (×19): qty 0.1

## 2022-04-27 MED ORDER — METFORMIN HCL 500 MG PO TABS
1000.0000 mg | ORAL_TABLET | Freq: Two times a day (BID) | ORAL | Status: DC
  Administered 2022-04-27 – 2022-05-05 (×16): 1000 mg via ORAL
  Filled 2022-04-27 (×17): qty 2

## 2022-04-27 MED ORDER — ALUM & MAG HYDROXIDE-SIMETH 200-200-20 MG/5ML PO SUSP: 30.0000 mL | ORAL | Status: DC | PRN

## 2022-04-27 MED ORDER — HYDROXYZINE HCL 25 MG PO TABS: 25.0000 mg | ORAL_TABLET | Freq: Three times a day (TID) | ORAL | Status: DC | PRN

## 2022-04-27 NOTE — ED Notes (Signed)
Transferred to [facility] BMU via wheelchair with ED tech and security. 2 bags of belongings sent with patient. 1 knife remains locked in safe.
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
## 2022-04-27 NOTE — Progress Notes (Signed)
Pharmacy Consult - Clozapine     46 yo male ordered clozapine 150 mg PO HS  This patient's order has been reviewed for prescribing contraindications.    Clozapine [facility] enrollment Verified: yes/no on 04/27/22 [facility] patient ID: [id number] Current Outpatient Monitoring: Monthly    Home Regimen:  500 mg PO HS Last dose: unknown   Dose Adjustments This Admission: N/A   Labs: Date    ANC    Submitted? 10/10 5100     Plan: Clozapine 150 mg po HS Monitor ANC at least weekly while inpatient

## 2022-04-27 NOTE — Group Note (Signed)
[facility] LCSW Group Therapy Note    Group Date: 04/27/2022 Start Time: 1315 End Time: 1415  Type of Therapy and Topic:  Group Therapy:  Overcoming Obstacles  Participation Level:  [facility] PARTICIPATION LEVEL: None   Description of Group:   In this group patients will be encouraged to explore what they see as obstacles to their own wellness and recovery. They will be guided to discuss their thoughts, feelings, and behaviors related to these obstacles. The group will process together ways to cope with barriers, with attention given to specific choices patients can make. Each patient will be challenged to identify changes they are motivated to make in order to overcome their obstacles. This group will be process-oriented, with patients participating in exploration of their own experiences as well as giving and receiving support and challenge from other group members.  Therapeutic Goals: 1. Patient will identify personal and current obstacles as they relate to admission. 2. Patient will identify barriers that currently interfere with their wellness or overcoming obstacles.  3. Patient will identify feelings, thought process and behaviors related to these barriers. 4. Patient will identify two changes they are willing to make to overcome these obstacles:    Summary of Patient Progress Patient was present for the entirety of the group process, however, he did not participate in the discussion at all. He did appear to attend to the conversation although he did not speak.   Therapeutic Modalities:   Cognitive Behavioral Therapy Solution Focused Therapy Motivational Interviewing Relapse Prevention Therapy   Glenis Smoker, LCSW

## 2022-04-27 NOTE — ED Notes (Signed)
Report to [place], [place]

## 2022-04-27 NOTE — ED Notes (Signed)
Hospital meal provided.  100% consumed, pt tolerated w/o complaints.  Waste discarded appropriately.   
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
## 2022-04-27 NOTE — ED Notes (Signed)
IVC/pending inpatient psych admit  
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
## 2022-04-27 NOTE — ED Notes (Signed)
  [name] legal guardian [phone] updated as to pt admission to [facility].

## 2022-04-27 NOTE — Tx Team (Signed)
Initial Treatment Plan 04/27/2022 11:34 AM [name] NID:[id number]    PATIENT STRESSORS: Medication change or noncompliance     PATIENT STRENGTHS: Motivation for treatment/growth    PATIENT IDENTIFIED PROBLEMS: Depression, auditory and visual hallucinations                     DISCHARGE CRITERIA:  Improved stabilization in mood, thinking, and/or behavior  PRELIMINARY DISCHARGE PLAN: Return to previous living arrangement  PATIENT/FAMILY INVOLVEMENT: This treatment plan has been presented to and reviewed with the patient, Lance [name]. The patient and family have been given the opportunity to ask questions and make suggestions.  Hyman Hopes, RN 04/27/2022, 11:34 AM

## 2022-04-27 NOTE — BH Assessment (Addendum)
Referral information for Psychiatric Hospitalization faxed to;   [facility] ([phone]- [phone]),   [facility] [phone]), Facility reports no adult beds available   [facility] [phone]), No answer  [facility] [phone] -or- [phone]), Denied due to aggression  [facility] [phone])  [facility] [phone])
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
## 2022-04-28 DIAGNOSIS — F203 Undifferentiated schizophrenia: Secondary | ICD-10-CM | POA: Diagnosis not present

## 2022-04-28 LAB — CBC WITH DIFFERENTIAL/PLATELET
Abs Immature Granulocytes: 0.01 10*3/uL (ref 0.00–0.07)
Basophils Absolute: 0 10*3/uL (ref 0.0–0.1)
Basophils Relative: 0 %
Eosinophils Absolute: 0 10*3/uL (ref 0.0–0.5)
Eosinophils Relative: 0 %
HCT: 42.9 % (ref 39.0–52.0)
Hemoglobin: 14.5 g/dL (ref 13.0–17.0)
Immature Granulocytes: 0 %
Lymphocytes Relative: 31 %
Lymphs Abs: 1.6 10*3/uL (ref 0.7–4.0)
MCH: 30.3 pg (ref 26.0–34.0)
MCHC: 33.8 g/dL (ref 30.0–36.0)
MCV: 89.7 fL (ref 80.0–100.0)
Monocytes Absolute: 0.7 10*3/uL (ref 0.1–1.0)
Monocytes Relative: 13 %
Neutro Abs: 3 10*3/uL (ref 1.7–7.7)
Neutrophils Relative %: 56 %
Platelets: 223 10*3/uL (ref 150–400)
RBC: 4.78 MIL/uL (ref 4.22–5.81)
RDW: 12.4 % (ref 11.5–15.5)
WBC: 5.3 10*3/uL (ref 4.0–10.5)
nRBC: 0 % (ref 0.0–0.2)

## 2022-04-28 LAB — LIPID PANEL
Cholesterol: 163 mg/dL (ref 0–200)
HDL: 35 mg/dL — ABNORMAL LOW (ref >40)
LDL Cholesterol: 52 mg/dL (ref 0–99)
Total CHOL/HDL Ratio: 4.7 RATIO
Triglycerides: 378 mg/dL — ABNORMAL HIGH (ref <150)
VLDL: 76 mg/dL — ABNORMAL HIGH (ref 0–40)

## 2022-04-28 LAB — GLUCOSE, CAPILLARY
Glucose-Capillary: 121 mg/dL — ABNORMAL HIGH (ref 70–99)
Glucose-Capillary: 153 mg/dL — ABNORMAL HIGH (ref 70–99)
Glucose-Capillary: 123 mg/dL — ABNORMAL HIGH (ref 70–99)

## 2022-04-28 LAB — HEMOGLOBIN A1C
Hgb A1c MFr Bld: 4.9 % (ref 4.8–5.6)
Mean Plasma Glucose: 93.93 mg/dL

## 2022-04-28 LAB — LITHIUM LEVEL: Lithium Lvl: 0.58 mmol/L — ABNORMAL LOW (ref 0.60–1.20)

## 2022-04-28 MED ORDER — ADULT MULTIVITAMIN W/MINERALS CH
1.0000 | ORAL_TABLET | Freq: Every day | ORAL | Status: DC
  Administered 2022-04-28 – 2022-05-05 (×8): 1 via ORAL
  Filled 2022-04-28 (×8): qty 1
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
## 2022-04-28 NOTE — Plan of Care (Signed)
Patient calm and cooperative on approach. Patient slept till lunch time. Denies SI,HI and AVH. Patient took off the splint in the morning. Noted swelling on his left hand. Patient let the staff put the splint back on. No pain verbalized. Appetite and energy level good. Support and encouragement given.

## 2022-04-28 NOTE — Progress Notes (Signed)
Recreation Therapy Notes  INPATIENT RECREATION TR PLAN  Patient Details Name: Olanrewaju Osborn MRN: [id number] DOB: [birth year] Today's Date: 04/28/2022  Rec Therapy Plan Is patient appropriate for Therapeutic Recreation?: Yes Treatment times per week: at least 3 Estimated Length of Stay: 5-7 days TR Treatment/Interventions: Group participation (Comment)  Discharge Criteria Pt will be discharged from therapy if:: Discharged Treatment plan/goals/alternatives discussed and agreed upon by:: Patient/family  Discharge Summary     Briget Shaheed 04/28/2022, 2:49 PM

## 2022-04-28 NOTE — Plan of Care (Signed)
  Problem: Nutrition: Goal: Adequate nutrition will be maintained Outcome: Progressing   Problem: Coping: Goal: Level of anxiety will decrease Outcome: Progressing   
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
## 2022-04-28 NOTE — Progress Notes (Signed)
Recreation Therapy Notes    Date: 04/28/2022  Time: 10:15 am     Location: Craft room   Behavioral response: N/A   Intervention Topic: Coping Skills    Discussion/Intervention: Patient refused to attend group.   Clinical Observations/Feedback:  Patient refused to attend group.    Lashea Goda LRT/CTRS        Tomeka Kantner 04/28/2022 12:17 PM

## 2022-04-28 NOTE — Progress Notes (Signed)
Recreation Therapy Notes  INPATIENT RECREATION THERAPY ASSESSMENT  Patient Details Name: Dmarius Reeder MRN: [id number] DOB: [birth year] Today's Date: 04/28/2022       Information Obtained From: Patient  Able to Participate in Assessment/Interview: Yes  Patient Presentation: Responsive  Reason for Admission (Per Patient): Active Symptoms, Impulsive Behavior, Aggressive/Threatening  Patient Stressors: Other (Comment) (Living environment)  Coping Skills:   Isolation, Avoidance, Exercise  Leisure Interests (2+):  Exercise - Walking, Engineer, petroleum, Music - Risk manager, Music - Listen  Frequency of Recreation/Participation: Monthly  Awareness of Community Resources:  Yes  Community Resources:  Other (Comment) Engineer, site)  Current Use: Yes  If no, Barriers?:    Expressed Interest in [employer] Information: Yes  County of Residence:  [place]  Patient Main Form of Transportation: Other (Comment) (Group home)  Patient Strengths:  Listening  Patient Identified Areas of Improvement:  Open up to new ideas  Patient Goal for Hospitalization:  Rest, Get my thoughts organized  Current SI (including self-harm):  No  Current HI:  No  Current AVH: Yes (Sometimes)  Staff Intervention Plan: Group Attendance, Collaborate with Interdisciplinary Treatment Team  Consent to Intern Participation: N/A  Jarad Barth 04/28/2022, 2:48 PM

## 2022-04-28 NOTE — Progress Notes (Signed)
[facility] MD Progress Note  04/28/2022 11:07 AM Lance [name]  MRN:  [id number] Subjective: Patient seen for follow-up.  46 year old man with schizophrenia.  Sleepy this morning.  We started him back on his medicine last night and it is possible he may not have been taking his full doses at home because it certainly seems to have been pretty sedating last night.  Otherwise no new complaints.  Still appears to be paranoid and disorganized but has not been hostile here on the unit.  He had taken the splint off of his hand.  I advised him that I spoke with orthopedics yesterday who felt like he should continue the splint but needed no other immediate treatment Principal Problem: Schizophrenia (HCC) Diagnosis: Principal Problem:   Schizophrenia (HCC) Active Problems:   Hypertension   Diabetes mellitus without complication (HCC)   Hyperlipidemia   Chronic anticoagulation   Schizoaffective disorder, bipolar type (HCC)   Tobacco use disorder   Boxer's metacarpal fracture, neck, closed  Total Time spent with patient: 30 minutes  Past Psychiatric History: Past history of longstanding chronic schizophrenia and multiple prior hospitalizations.  Continues to be symptomatic even with maximal medication  Past Medical History:  Past Medical History:  Diagnosis Date   Depression    Diabetes mellitus without complication (HCC)    Hyperlipidemia    Hypertension    Lupus anticoagulant disorder (HCC) 06/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   PE (pulmonary thromboembolism) (HCC)    Schizo affective schizophrenia (HCC)    Supratherapeutic INR 11/19/2016   History reviewed. No pertinent surgical history. Family History:  Family History  Problem Relation Age of Onset   CAD Mother    CAD Sister    Family Psychiatric  History: See previous Social History:  Social History   Substance and Sexual Activity  Alcohol Use Yes   Comment: occassionally     Social History   Substance and Sexual  Activity  Drug Use Not Currently   Types: Marijuana   Comment: Last use 11/29/16    Social History   Socioeconomic History   Marital status: Single    Spouse name: Not on file   Number of children: Not on file   Years of education: Not on file   Highest education level: Not on file  Occupational History   Not on file  Tobacco Use   Smoking status: Every Day    Packs/day: 1.00    Years: 23.00    Total pack years: 23.00    Types: Cigarettes   Smokeless tobacco: Never   Tobacco comments:    will provide material  Substance and Sexual Activity   Alcohol use: Yes    Comment: occassionally   Drug use: Not Currently    Types: Marijuana    Comment: Last use 11/29/16   Sexual activity: Never    Birth control/protection: None    Comment: occasional marijuana- none recently  Other Topics Concern   Not on file  Social History Narrative   From a group home in [place]   Social Determinants of Health   Financial Resource Strain: Low Risk  (03/20/2021)   Overall Financial Resource Strain (CARDIA)    Difficulty of Paying Living Expenses: Not very hard  Food Insecurity: No Food Insecurity (04/27/2022)   Hunger Vital Sign    Worried About Running Out of Food in the Last Year: Never true    Ran Out of Food in the Last Year: Never true  Transportation Needs:  No Transportation Needs (04/27/2022)   PRAPARE - Administrator, Civil Service (Medical): No    Lack of Transportation (Non-Medical): No  Physical Activity: Insufficiently Active (03/20/2021)   Exercise Vital Sign    Days of Exercise per Week: 3 days    Minutes of Exercise per Session: 20 min  Stress: No Stress Concern Present (03/20/2021)   [employer] of Occupational Health - Occupational Stress Questionnaire    Feeling of Stress : Only a little  Social Connections: Socially Isolated (03/20/2021)   Social Connection and Isolation Panel [NHANES]    Frequency of Communication with Friends and Family: More  than three times a week    Frequency of Social Gatherings with Friends and Family: More than three times a week    Attends Religious Services: Never    Database administrator or Organizations: No    Attends Engineer, structural: Never    Marital Status: Never married   Additional Social History:                         Sleep: Fair  Appetite:  Fair  Current Medications: Current Facility-Administered Medications  Medication Dose Route Frequency Provider Last Rate Last Admin   acetaminophen (TYLENOL) tablet 650 mg  650 mg Oral Q6H PRN Dixon, Rashaun M, NP       alum & mag hydroxide-simeth (MAALOX/MYLANTA) 200-200-20 MG/5ML suspension 30 mL  30 mL Oral Q4H PRN Dixon, Rashaun M, NP       apixaban (ELIQUIS) tablet 5 mg  5 mg Oral BID Amesha Bailey, [name], MD   5 mg at 04/28/22 0851   cloZAPine (CLOZARIL) tablet 150 mg  150 mg Oral QHS Yan Pankratz T, MD   150 mg at 04/27/22 2105   hydrOXYzine (ATARAX) tablet 25 mg  25 mg Oral TID PRN Jearld Lesch, NP       insulin detemir (LEVEMIR) injection 10 Units  10 Units Subcutaneous BID Leeasia Secrist, [name], MD   10 Units at 04/28/22 0852   lisinopril (ZESTRIL) tablet 2.5 mg  2.5 mg Oral Daily Dalayla Aldredge T, MD   2.5 mg at 04/28/22 8115   lithium carbonate capsule 300 mg  300 mg Oral q morning Majed Pellegrin T, MD       lithium carbonate capsule 450 mg  450 mg Oral QHS Jermain Curt T, MD   450 mg at 04/27/22 2053   magnesium hydroxide (MILK OF MAGNESIA) suspension 30 mL  30 mL Oral Daily PRN Jearld Lesch, NP       metFORMIN (GLUCOPHAGE) tablet 1,000 mg  1,000 mg Oral BID WC Zerrick Hanssen, [name], MD   1,000 mg at 04/28/22 0851   multivitamin with minerals tablet 1 tablet  1 tablet Oral Daily Deronda Christian, [name], MD       OLANZapine (ZYPREXA) tablet 20 mg  20 mg Oral QHS Caidyn Blossom T, MD   20 mg at 04/27/22 2106   propranolol (INDERAL) tablet 20 mg  20 mg Oral QHS Djuana Littleton T, MD   20 mg at 04/27/22 2106   sertraline (ZOLOFT) tablet  100 mg  100 mg Oral Daily Latesha Chesney T, MD   100 mg at 04/28/22 0851   simvastatin (ZOCOR) tablet 40 mg  40 mg Oral q1800 Elester Apodaca, [name], MD   40 mg at 04/27/22 1816   traZODone (DESYREL) tablet 100 mg  100 mg Oral QHS PRN Dorothye Berni, [name], MD  100 mg at 04/27/22 2105    Lab Results:  Results for orders placed or performed during the hospital encounter of 04/27/22 (from the past 48 hour(s))  Glucose, capillary     Status: Abnormal   Collection Time: 04/27/22  5:27 PM  Result Value Ref Range   Glucose-Capillary 180 (H) 70 - 99 mg/dL    Comment: Glucose reference range applies only to samples taken after fasting for at least 8 hours.  Glucose, capillary     Status: Abnormal   Collection Time: 04/27/22  7:37 PM  Result Value Ref Range   Glucose-Capillary 101 (H) 70 - 99 mg/dL    Comment: Glucose reference range applies only to samples taken after fasting for at least 8 hours.  Glucose, capillary     Status: Abnormal   Collection Time: 04/28/22  6:45 AM  Result Value Ref Range   Glucose-Capillary 121 (H) 70 - 99 mg/dL    Comment: Glucose reference range applies only to samples taken after fasting for at least 8 hours.    Blood Alcohol level:  Lab Results  Component Value Date   ETH <10 04/26/2022   ETH <10 11/22/2021    Metabolic Disorder Labs: Lab Results  Component Value Date   HGBA1C 5.4 11/12/2020   MPG 108 11/12/2020   MPG 99.67 09/20/2020   Lab Results  Component Value Date   PROLACTIN 2.0 (L) 08/31/2016   PROLACTIN 4.8 05/22/2016   Lab Results  Component Value Date   CHOL 157 09/20/2020   TRIG 447 (H) 09/20/2020   HDL 32 (L) 09/20/2020   CHOLHDL 4.9 09/20/2020   VLDL UNABLE TO CALCULATE IF TRIGLYCERIDE OVER 400 mg/dL 16/10/960404/01/2021   LDLCALC UNABLE TO CALCULATE IF TRIGLYCERIDE OVER 400 mg/dL 54/09/811904/01/2021   LDLCALC 53 07/16/2020    Physical Findings: AIMS:  , ,  ,  ,    CIWA:    COWS:     Musculoskeletal: Strength & Muscle Tone: within normal limits Gait &  Station: normal Patient leans: N/A  Psychiatric Specialty Exam:  Presentation  General Appearance:  Bizarre  Eye Contact: Fair  Speech: Clear and Coherent  Speech Volume: Decreased  Handedness: Right   Mood and Affect  Mood: Anxious; Irritable  Affect: Congruent   Thought Process  Thought Processes: Disorganized  Descriptions of Associations:Intact  Orientation:Full (Time, Place and Person)  Thought Content:Illogical; Paranoid Ideation  History of Schizophrenia/Schizoaffective disorder:Yes  Duration of Psychotic Symptoms:Greater than six months  Hallucinations:Hallucinations: Auditory  Ideas of Reference:Paranoia  Suicidal Thoughts:Suicidal Thoughts: No  Homicidal Thoughts:Homicidal Thoughts: Yes, Active HI Active Intent and/or Plan: With Plan HI Passive Intent and/or Plan: With Plan   Sensorium  Memory: Immediate Fair  Judgment: Impaired  Insight: Lacking   Executive Functions  Concentration: Poor  Attention Span: Poor  Recall: Poor  Fund of Knowledge: Fair  Language: Fair   Psychomotor Activity  Psychomotor Activity: Psychomotor Activity: Normal   Assets  Assets: Communication Skills; Financial Resources/Insurance; Housing   Sleep  Sleep: Sleep: Fair    Physical Exam: Physical Exam Vitals and nursing note reviewed.  Constitutional:      Appearance: Normal appearance.  HENT:     Head: Normocephalic and atraumatic.     Mouth/Throat:     Pharynx: Oropharynx is clear.  Eyes:     Pupils: Pupils are equal, round, and reactive to light.  Cardiovascular:     Rate and Rhythm: Normal rate and regular rhythm.  Pulmonary:     Effort: Pulmonary effort is  normal.     Breath sounds: Normal breath sounds.  Abdominal:     General: Abdomen is flat.     Palpations: Abdomen is soft.  Musculoskeletal:        General: Normal range of motion.  Skin:    General: Skin is warm and dry.  Neurological:     General: No  focal deficit present.     Mental Status: He is alert. Mental status is at baseline.  Psychiatric:        Attention and Perception: He is inattentive.        Mood and Affect: Mood normal. Affect is blunt.        Speech: Speech is delayed.        Behavior: Behavior is slowed.        Thought Content: Thought content is paranoid.        Cognition and Memory: Memory is impaired.    Review of Systems  Constitutional: Negative.   HENT: Negative.    Eyes: Negative.   Respiratory: Negative.    Cardiovascular: Negative.   Gastrointestinal: Negative.   Musculoskeletal: Negative.   Skin: Negative.   Neurological: Negative.   Psychiatric/Behavioral: Negative.     Blood pressure 102/73, pulse 66, temperature 98 F (36.7 C), temperature source Oral, resp. rate 16, height 5' 10.5" (1.791 m), weight 73 kg, SpO2 100 %. Body mass index is 22.77 kg/m.   Treatment Plan Summary: Medication management and Plan patient's admission seems to be his usual pattern at this point.  Not hostile or threatening in the hospital.  Mostly stays to himself but he is not oppositional.  Compliant with medicine.  Check blood sugars over the last day and they are all in the low to mid 100s.  We can do that for another day and then I think it will be safe to stop.  Encourage patient to be out of his room and interacting with others.  Daily assessment of dangerousness prior to what I hope will be an plan to get him back to his group home before long  Mordecai Rasmussen, MD 04/28/2022, 11:07 AM
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
## 2022-04-28 NOTE — Group Note (Signed)
[facility] LCSW Group Therapy Note   Group Date: 04/28/2022 Start Time: 1300 End Time: 1400  Type of Therapy/Topic:  Group Therapy:  Feelings about Diagnosis  Participation Level:  Did Not Attend   Description of Group:    This group will allow patients to explore their thoughts and feelings about diagnoses they have received. Patients will be guided to explore their level of understanding and acceptance of these diagnoses. Facilitator will encourage patients to process their thoughts and feelings about the reactions of others to their diagnosis, and will guide patients in identifying ways to discuss their diagnosis with significant others in their lives. This group will be process-oriented, with patients participating in exploration of their own experiences as well as giving and receiving support and challenge from other group members.   Therapeutic Goals: 1. Patient will demonstrate understanding of diagnosis as evidence by identifying two or more symptoms of the disorder:  2. Patient will be able to express two feelings regarding the diagnosis 3. Patient will demonstrate ability to communicate their needs through discussion and/or role plays  Summary of Patient Progress: Patient declined to attend group, despite encouragement to do so.  Therapeutic Modalities:   Cognitive Behavioral Therapy Brief Therapy Feelings Identification    [name], LCSW

## 2022-04-29 DIAGNOSIS — F203 Undifferentiated schizophrenia: Secondary | ICD-10-CM | POA: Diagnosis not present

## 2022-04-29 LAB — GLUCOSE, CAPILLARY
Glucose-Capillary: 190 mg/dL — ABNORMAL HIGH (ref 70–99)
Glucose-Capillary: 153 mg/dL — ABNORMAL HIGH (ref 70–99)

## 2022-04-29 MED ORDER — CLONAZEPAM 0.25 MG PO TBDP: 0.2500 mg | ORAL_TABLET | Freq: Two times a day (BID) | ORAL | Status: DC | PRN

## 2022-04-29 MED ORDER — LITHIUM CARBONATE 300 MG PO CAPS
450.0000 mg | ORAL_CAPSULE | Freq: Two times a day (BID) | ORAL | Status: DC
  Administered 2022-04-29 – 2022-05-05 (×12): 450 mg via ORAL
  Filled 2022-04-29 (×12): qty 1
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
## 2022-04-29 NOTE — Progress Notes (Signed)
Patient calm and pleasant during assessment. Pt observed interacting appropriately with staff and peers on the unit. Pt given education, support, and encouragement to be active in his treatment plan. Pt compliant with medication administration per MD orders. Pt being monitored Q 15 minutes for safety per unit protocol, remains safe on the unit

## 2022-04-29 NOTE — Progress Notes (Signed)
Recreation Therapy Notes  Date: 04/29/2022  Time: 10:45 am    Location: Craft room   Behavioral response: Appropriate   Intervention Topic:  Self-esteem     Discussion/Intervention:  Group content today was focused on self-esteem. Patient defined self-esteem and where it comes form. The group described reasons self-esteem is important. Individuals stated things that impact self-esteem and positive ways to improve self-esteem. The group participated in the intervention "Collage of Me" where patients were able to create a collage of positive things that makes them who they are. Clinical Observations/Feedback: Patient came to group and was focused on what peers and staff had to say about self-esteem. Individual was social with peers and staff while participating in the intervention.     [place] LRT/CTRS         Abran Gavigan 04/29/2022 1:45 PM

## 2022-04-29 NOTE — Progress Notes (Signed)
Pt denies SI/HI but endorses AVH, worse at night and are not telling him to hurt self or others. Pt verbally agrees to approach staff if these become apparent or before harming themselves/others. Rates depression 6/10. Rates anxiety 6/10. Rates pain 5/10. Pt has been pacing most of the day. Pt stays to himself. Pt stated that his goal was to find meds that work better for him. Scheduled medications administered to pt, per MD orders. RN provided support and encouragement to pt. Q15 min safety checks implemented and continued. Pt safe on the unit. RN will continue to monitor and intervene as needed. Problem: Education: Goal: Knowledge of General Education information will improve Description: Including pain rating scale, medication(s)/side effects and non-pharmacologic comfort measures Outcome: Progressing   Problem: Activity: Goal: Risk for activity intolerance will decrease Outcome: Progressing    04/29/22 0756  Psych Admission Type (Psych Patients Only)  Admission Status Involuntary  Psychosocial Assessment  Patient Complaints Depression;Anxiety  Eye Contact Brief  Facial Expression Flat;Sad  Affect Anxious;Sad;Depressed  Speech Logical/coherent;Soft  Interaction Assertive  Motor Activity Slow  Appearance/Hygiene Unremarkable  Behavior Characteristics Cooperative;Appropriate to situation;Anxious  Mood Anxious;Depressed;Pleasant  Thought Process  Coherency Circumstantial  Content WDL  Delusions None reported or observed  Perception Hallucinations  Hallucination Auditory;Visual  Judgment Limited  Confusion None  Danger to Self  Current suicidal ideation? Denies  Danger to Others  Danger to Others None reported or observed

## 2022-04-29 NOTE — [facility] Group Notes (Signed)
[facility] Group Notes:  (Nursing/MHT/Case Management/Adjunct)  Date:  04/29/2022  Time:  8:57 PM  Type of Therapy:   Wrap up  Participation Level:  Active  Participation Quality:  Appropriate  Affect:  Appropriate  Cognitive:  Alert  Insight:  Good  Engagement in Group:  Engaged and had an ok day and he want his med increase.  Modes of Intervention:  Support  Summary of Progress/Problems:  Lance [name] 04/29/2022, 8:57 PM

## 2022-04-29 NOTE — BH IP Treatment Plan (Signed)
Interdisciplinary Treatment and Diagnostic Plan Update  04/29/2022 Time of Session: 9:30AM Lance [name] MRN: [id number]  Principal Diagnosis: Schizophrenia [facility])  Secondary Diagnoses: Principal Problem:   Schizophrenia ([place]) Active Problems:   Hypertension   Diabetes mellitus without complication ([place])   Hyperlipidemia   Chronic anticoagulation   Schizoaffective disorder, bipolar type ([place])   Tobacco use disorder   Boxer's metacarpal fracture, neck, closed   Current Medications:  Current Facility-Administered Medications  Medication Dose Route Frequency Provider Last Rate Last Admin   acetaminophen (TYLENOL) tablet 650 mg  650 mg Oral Q6H PRN Deloria Lair, NP   650 mg at 04/29/22 0756   alum & mag hydroxide-simeth (MAALOX/MYLANTA) 200-200-20 MG/5ML suspension 30 mL  30 mL Oral Q4H PRN Deloria Lair, NP       apixaban (ELIQUIS) tablet 5 mg  5 mg Oral BID Clapacs, [name], MD   5 mg at 04/29/22 0805   clonazePAM (KLONOPIN) disintegrating tablet 0.25 mg  0.25 mg Oral BID PRN Clapacs, Madie Reno, MD       cloZAPine (CLOZARIL) tablet 150 mg  150 mg Oral QHS Clapacs, [name], MD   150 mg at 04/28/22 2110   hydrOXYzine (ATARAX) tablet 25 mg  25 mg Oral TID PRN Deloria Lair, NP       insulin detemir (LEVEMIR) injection 10 Units  10 Units Subcutaneous BID Clapacs, [name], MD   10 Units at 04/29/22 0806   lisinopril (ZESTRIL) tablet 2.5 mg  2.5 mg Oral Daily Clapacs, [name], MD   2.5 mg at 04/29/22 0756   lithium carbonate capsule 450 mg  450 mg Oral BID WC Clapacs, [name], MD       magnesium hydroxide (MILK OF MAGNESIA) suspension 30 mL  30 mL Oral Daily PRN Doren Custard, Rashaun M, NP       metFORMIN (GLUCOPHAGE) tablet 1,000 mg  1,000 mg Oral BID WC Clapacs, [name], MD   1,000 mg at 04/29/22 0756   multivitamin with minerals tablet 1 tablet  1 tablet Oral Daily Clapacs, Madie Reno, MD   1 tablet at 04/29/22 0756   OLANZapine (ZYPREXA) tablet 20 mg  20 mg Oral QHS Clapacs, [name], MD   20 mg  at 04/28/22 2111   propranolol (INDERAL) tablet 20 mg  20 mg Oral QHS Clapacs, [name], MD   20 mg at 04/27/22 2106   sertraline (ZOLOFT) tablet 100 mg  100 mg Oral Daily Clapacs, [name], MD   100 mg at 04/29/22 0756   simvastatin (ZOCOR) tablet 40 mg  40 mg Oral q1800 Clapacs, [name], MD   40 mg at 04/28/22 1657   traZODone (DESYREL) tablet 100 mg  100 mg Oral QHS PRN Clapacs, [name], MD   100 mg at 04/28/22 2110   PTA Medications: Medications Prior to Admission  Medication Sig Dispense Refill Last Dose   acetaminophen (TYLENOL) 500 MG tablet Take 500 mg by mouth every 4 (four) hours as needed.      acetaminophen (TYLENOL) 650 MG CR tablet Take 650 mg by mouth every 8 (eight) hours as needed for pain.      albuterol (VENTOLIN HFA) 108 (90 Base) MCG/ACT inhaler Inhale 1-2 puffs into the lungs every 6 (six) hours as needed for wheezing or shortness of breath. (Patient taking differently: Inhale 1 puff into the lungs 2 (two) times daily as needed for wheezing or shortness of breath.) 18 g 1    alum &  mag hydroxide-simeth (MAALOX/MYLANTA) 200-200-20 MG/5ML suspension Take 30 mLs by mouth daily as needed for indigestion or heartburn.      ammonium lactate (AMLACTIN) 12 % cream Apply 1 application topically as needed for dry skin.      apixaban (ELIQUIS) 5 MG TABS tablet Take 1 tablet (5 mg total) by mouth 2 (two) times daily. 60 tablet 6    blood glucose meter kit and supplies Dispense based on patient and insurance preference. Use up to four times daily as directed. (FOR ICD-10 E10.9, E11.9). 1 each 6    cloZAPine (CLOZARIL) 100 MG tablet Take 5 tablets (500 mg total) by mouth at bedtime. 150 tablet 1    fluticasone-salmeterol (ADVAIR) 250-50 MCG/ACT AEPB Inhale 1 puff into the lungs 2 (two) times daily.      glucose blood (FORA V30A BLOOD GLUCOSE TEST) test strip CHECK BLOOD SUGAR TWICE DAILY. 100 strip 3    hydrOXYzine (VISTARIL) 50 MG capsule Take 100 mg by mouth at bedtime.      LEVEMIR FLEXTOUCH  100 UNIT/ML FlexTouch Pen INJECT 10 UNITS INTO THE SKIN TWICE DAILY. 3 mL 11    lisinopril (ZESTRIL) 2.5 MG tablet Take 1 tablet (2.5 mg total) by mouth every morning. 30 tablet 4    lithium carbonate (ESKALITH) 450 MG CR tablet Take 1 tablet (450 mg total) by mouth at bedtime. 30 tablet 1    lithium carbonate (LITHOBID) 300 MG CR tablet Take 1 tablet (300 mg total) by mouth in the morning. 30 tablet 1    magnesium hydroxide (MILK OF MAGNESIA) 400 MG/5ML suspension Take 30 mLs by mouth daily as needed for mild constipation.      metFORMIN (GLUCOPHAGE) 1000 MG tablet TAKE 1 TABLET BY MOUTH TWICE DAILY 56 tablet 10    NOVOFINE AUTOCOVER PEN NEEDLE 30G X 8 MM MISC USE WITH LEVEMIR TWICE DAILY. 100 each 11    OLANZapine (ZYPREXA) 20 MG tablet Take 1 tablet (20 mg total) by mouth at bedtime. 30 tablet 1    Omega-3 Fatty Acids (FISH OIL) 1000 MG CAPS TAKE 1 CAPSULE BY MOUTH ONCE A DAY. 30 capsule 11    propranolol (INDERAL) 20 MG tablet Take 1 tablet (20 mg total) by mouth at bedtime. 30 tablet 1    sertraline (ZOLOFT) 100 MG tablet Take 100 mg by mouth daily.      simvastatin (ZOCOR) 40 MG tablet TAKE 1 TABLET BY MOUTH ONCE DAILY AT 6PM 28 tablet 10    traZODone (DESYREL) 100 MG tablet Take 1 tablet (100 mg total) by mouth at bedtime. 30 tablet 1     Patient Stressors: Medication change or noncompliance    Patient Strengths: Motivation for treatment/growth   Treatment Modalities: Medication Management, Group therapy, Case management,  1 to 1 session with clinician, Psychoeducation, Recreational therapy.   Physician Treatment Plan for Primary Diagnosis: Schizophrenia ([place]) Long Term Goal(s): Improvement in symptoms so as ready for discharge   Short Term Goals: Ability to maintain clinical measurements within normal limits will improve Compliance with prescribed medications will improve Ability to verbalize feelings will improve Ability to demonstrate self-control will improve Ability to  identify and develop effective coping behaviors will improve  Medication Management: Evaluate patient's response, side effects, and tolerance of medication regimen.  Therapeutic Interventions: 1 to 1 sessions, Unit Group sessions and Medication administration.  Evaluation of Outcomes: Not Met  Physician Treatment Plan for Secondary Diagnosis: Principal Problem:   Schizophrenia [facility]) Active Problems:   Hypertension  Diabetes mellitus without complication (H[facility])   Hyperlipidemia   Chronic anticoagulation   Schizoaffective disorder, bipolar type (H[facility])   Tobacco use disorder   Boxer's metacarpal fracture, neck, closed  Long Term Goal(s): Improvement in symptoms so as ready for discharge   Short Term Goals: Ability to maintain clinical measurements within normal limits will improve Compliance with prescribed medications will improve Ability to verbalize feelings will improve Ability to demonstrate self-control will improve Ability to identify and develop effective coping behaviors will improve     Medication Management: Evaluate patient's response, side effects, and tolerance of medication regimen.  Therapeutic Interventions: 1 to 1 sessions, Unit Group sessions and Medication administration.  Evaluation of Outcomes: Not Met   RN Treatment Plan for Primary Diagnosis: Schizophrenia ([place]) Long Term Goal(s): Knowledge of disease and therapeutic regimen to maintain health will improve  Short Term Goals: Ability to demonstrate self-control, Ability to participate in decision making will improve, Ability to verbalize feelings will improve, Ability to disclose and discuss suicidal ideas, Ability to identify and develop effective coping behaviors will improve, and Compliance with prescribed medications will improve  Medication Management: RN will administer medications as ordered by provider, will assess and evaluate patient's response and provide education to patient for prescribed  medication. RN will report any adverse and/or side effects to prescribing provider.  Therapeutic Interventions: 1 on 1 counseling sessions, Psychoeducation, Medication administration, Evaluate responses to treatment, Monitor vital signs and CBGs as ordered, Perform/monitor CIWA, COWS, AIMS and Fall Risk screenings as ordered, Perform wound care treatments as ordered.  Evaluation of Outcomes: Not Met   LCSW Treatment Plan for Primary Diagnosis: Schizophrenia ([place]) Long Term Goal(s): Safe transition to appropriate next level of care at discharge, Engage patient in therapeutic group addressing interpersonal concerns.  Short Term Goals: Engage patient in aftercare planning with referrals and resources, Increase social support, Increase ability to appropriately verbalize feelings, Increase emotional regulation, Facilitate acceptance of mental health diagnosis and concerns, and Increase skills for wellness and recovery  Therapeutic Interventions: Assess for all discharge needs, 1 to 1 time with Social worker, Explore available resources and support systems, Assess for adequacy in community support network, Educate family and significant other(s) on suicide prevention, Complete Psychosocial Assessment, Interpersonal group therapy.  Evaluation of Outcomes: Not Met   Progress in Treatment: Attending groups: No. Participating in groups: No. Taking medication as prescribed: Yes. Toleration medication: Yes. Family/Significant other contact made: Yes, individual(s) contacted:  SPE completed with the patient's guardian. Patient understands diagnosis: Yes. Discussing patient identified problems/goals with staff: Yes. Medical problems stabilized or resolved: Yes. Denies suicidal/homicidal ideation: Yes. Issues/concerns per patient self-inventory: No. Other: none  New problem(s) identified: No, Describe:  none  New Short Term/Long Term Goal(s): detox, elimination of symptoms of psychosis, medication  management for mood stabilization; elimination of SI thoughts; development of comprehensive mental wellness/sobriety plan.   Patient Goals:  "finding some better suitable medications"  Discharge Plan or Barriers: CSW to assist patient and guardian in development of appropriate discharge plans. Patient is able to return to group home.   Reason for Continuation of Hospitalization: Aggression Anxiety Depression Medication stabilization Suicidal ideation  Estimated Length of Stay:  1-7 days  Last 3 [place] Suicide Severity Risk Score: Flowsheet Row Admission (Current) from 04/27/2022 in [place] ED from 04/26/2022 in [place] ED from 11/22/2021 in [place] No Risk No Risk No Risk  Last PHQ 2/9 Scores:    03/20/2021   11:14 AM 03/20/2021   11:04 AM  Depression screen PHQ 2/9  Decreased Interest 0 0  Down, Depressed, Hopeless 0 0  PHQ - 2 Score 0 0    Scribe for Treatment Team: Rozann Lesches, Marlinda Mike 04/29/2022 2:54 PM

## 2022-04-29 NOTE — Progress Notes (Signed)
[facility] MD Progress Note  04/29/2022 3:46 PM Lance [name]  MRN:  [id number] Subjective: Follow-up with this 46-year-old man with schizophrenia.  Patient seen and he also attended treatment team this morning.  Patient continues to complain that his psychotic symptoms have been worse.  Talks about hearing and seeing things at times but more than that talks about feeling like his mind is not right and that he has trouble coping with stress and that he has problems just getting through his day without being upset.  Wants to discuss medication changes.  He has not had any behavior problems here on the unit.  Taking care of his ADLs fine.  Blood sugars are looking okay.  We put down fingersticks for a couple days just to make sure they were stable. Principal Problem: Schizophrenia ([place]) Diagnosis: Principal Problem:   Schizophrenia ([place]) Active Problems:   Hypertension   Diabetes mellitus without complication ([place])   Hyperlipidemia   Chronic anticoagulation   Schizoaffective disorder, bipolar type ([place])   Tobacco use disorder   Boxer's metacarpal fracture, neck, closed  Total Time spent with patient: 30 minutes  Past Psychiatric History: Patient has a past history of schizophrenia and has had many admissions over the course of his lifetime and is well-known to our service  Past Medical History:  Past Medical History:  Diagnosis Date   Depression    Diabetes mellitus without complication ([place])    Hyperlipidemia    Hypertension    Lupus anticoagulant disorder ([place]) 06/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   PE (pulmonary thromboembolism) ([place])    Schizo affective schizophrenia ([place])    Supratherapeutic INR 11/19/2016   History reviewed. No pertinent surgical history. Family History:  Family History  Problem Relation Age of Onset   CAD Mother    CAD Sister    Family Psychiatric  History: See previous Social History:  Social History   Substance and Sexual Activity  Alcohol  Use Yes   Comment: occassionally     Social History   Substance and Sexual Activity  Drug Use Not Currently   Types: Marijuana   Comment: Last use 11/29/16    Social History   Socioeconomic History   Marital status: Single    Spouse name: Not on file   Number of children: Not on file   Years of education: Not on file   Highest education level: Not on file  Occupational History   Not on file  Tobacco Use   Smoking status: Every Day    Packs/day: 1.00    Years: 23.00    Total pack years: 23.00    Types: Cigarettes   Smokeless tobacco: Never   Tobacco comments:    will provide material  Substance and Sexual Activity   Alcohol use: Yes    Comment: occassionally   Drug use: Not Currently    Types: Marijuana    Comment: Last use 11/29/16   Sexual activity: Never    Birth control/protection: None    Comment: occasional marijuana- none recently  Other Topics Concern   Not on file  Social History Narrative   From a group home in [place] Determinants of Health   Financial Resource Strain: Low Risk  (03/20/2021)   Overall Financial Resource Strain (CARDIA)    Difficulty of Paying Living Expenses: Not very hard  Food Insecurity: No Food Insecurity (04/27/2022)   Hunger Vital Sign    Worried About Running Out of Food in the  Last Year: Never true    [place] in the Last Year: Never true  Transportation Needs: No Transportation Needs (04/27/2022)   PRAPARE - Hydrologist (Medical): No    Lack of Transportation (Non-Medical): No  Physical Activity: Insufficiently Active (03/20/2021)   Exercise Vital Sign    Days of Exercise per Week: 3 days    Minutes of Exercise per Session: 20 min  Stress: No Stress Concern Present (03/20/2021)   [place]    Feeling of Stress : Only a little  Social Connections: Socially Isolated (03/20/2021)   Social Connection and Isolation  Panel [NHANES]    Frequency of Communication with Friends and Family: More than three times a week    Frequency of Social Gatherings with Friends and Family: More than three times a week    Attends Religious Services: Never    Marine scientist or Organizations: No    Attends Music therapist: Never    Marital Status: Never married   Additional Social History:                         Sleep: Fair  Appetite:  Fair  Current Medications: Current Facility-Administered Medications  Medication Dose Route Frequency Provider Last Rate Last Admin   acetaminophen (TYLENOL) tablet 650 mg  650 mg Oral Q6H PRN Deloria Lair, NP   650 mg at 04/29/22 0756   alum & mag hydroxide-simeth (MAALOX/MYLANTA) 200-200-20 MG/5ML suspension 30 mL  30 mL Oral Q4H PRN Deloria Lair, NP       apixaban (ELIQUIS) tablet 5 mg  5 mg Oral BID Kate Larock T, MD   5 mg at 04/29/22 0805   clonazePAM (KLONOPIN) disintegrating tablet 0.25 mg  0.25 mg Oral BID PRN Shay Bartoli, Madie Reno, MD       cloZAPine (CLOZARIL) tablet 150 mg  150 mg Oral QHS Myranda Pavone T, MD   150 mg at 04/28/22 2110   hydrOXYzine (ATARAX) tablet 25 mg  25 mg Oral TID PRN Deloria Lair, NP       insulin detemir (LEVEMIR) injection 10 Units  10 Units Subcutaneous BID Cassity Christian T, MD   10 Units at 04/29/22 0806   lisinopril (ZESTRIL) tablet 2.5 mg  2.5 mg Oral Daily Tita Terhaar T, MD   2.5 mg at 04/29/22 0756   lithium carbonate capsule 450 mg  450 mg Oral BID WC Alin Chavira T, MD       magnesium hydroxide (MILK OF MAGNESIA) suspension 30 mL  30 mL Oral Daily PRN Doren Custard, Rashaun M, NP       metFORMIN (GLUCOPHAGE) tablet 1,000 mg  1,000 mg Oral BID WC Dennise Raabe T, MD   1,000 mg at 04/29/22 0756   multivitamin with minerals tablet 1 tablet  1 tablet Oral Daily Faustine Tates, Madie Reno, MD   1 tablet at 04/29/22 0756   OLANZapine (ZYPREXA) tablet 20 mg  20 mg Oral QHS Kalani Baray T, MD   20 mg at 04/28/22 2111    propranolol (INDERAL) tablet 20 mg  20 mg Oral QHS [place] Souter T, MD   20 mg at 04/27/22 2106   sertraline (ZOLOFT) tablet 100 mg  100 mg Oral Daily Yailyn Strack T, MD   100 mg at 04/29/22 0756   simvastatin (ZOCOR) tablet 40 mg  40 mg Oral q1800 Autrey Human T,  MD   40 mg at 04/28/22 1657   traZODone (DESYREL) tablet 100 mg  100 mg Oral QHS PRN Mal Asher, [name], MD   100 mg at 04/28/22 2110    Lab Results:  Results for orders placed or performed during the hospital encounter of 04/27/22 (from the past 48 hour(s))  Glucose, capillary     Status: Abnormal   Collection Time: 04/27/22  5:27 PM  Result Value Ref Range   Glucose-Capillary 180 (H) 70 - 99 mg/dL    Comment: Glucose reference range applies only to samples taken after fasting for at least 8 hours.  Glucose, capillary     Status: Abnormal   Collection Time: 04/27/22  7:37 PM  Result Value Ref Range   Glucose-Capillary 101 (H) 70 - 99 mg/dL    Comment: Glucose reference range applies only to samples taken after fasting for at least 8 hours.  Glucose, capillary     Status: Abnormal   Collection Time: 04/28/22  6:45 AM  Result Value Ref Range   Glucose-Capillary 121 (H) 70 - 99 mg/dL    Comment: Glucose reference range applies only to samples taken after fasting for at least 8 hours.  Lipid panel     Status: Abnormal   Collection Time: 04/28/22 12:41 PM  Result Value Ref Range   Cholesterol 163 0 - 200 mg/dL   Triglycerides 505 (H) <150 mg/dL   HDL 35 (L) >39 mg/dL   Total CHOL/HDL Ratio 4.7 RATIO   VLDL 76 (H) 0 - 40 mg/dL   LDL Cholesterol 52 0 - 99 mg/dL    Comment:        Total Cholesterol/HDL:CHD Risk Coronary Heart Disease Risk Table                     Men   Women  1/2 Average Risk   3.4   3.3  Average Risk       5.0   4.4  2 X Average Risk   9.6   7.1  3 X Average Risk  23.4   11.0        Use the calculated Patient Ratio above and the CHD Risk Table to determine the patient's CHD Risk.        ATP III  CLASSIFICATION (LDL):  <100     mg/dL   Optimal  767-341  mg/dL   Near or Above                    Optimal  130-159  mg/dL   Borderline  937-902  mg/dL   High  >409     mg/dL   Very High Performed at [facility], [address] [place], [place] [address]   CBC with Differential/Platelet     Status: None   Collection Time: 04/28/22 12:41 PM  Result Value Ref Range   WBC 5.3 4.0 - 10.5 K/uL   RBC 4.78 4.22 - 5.81 MIL/uL   Hemoglobin 14.5 13.0 - 17.0 g/dL   HCT 99.2 42.6 - 83.4 %   MCV 89.7 80.0 - 100.0 fL   MCH 30.3 26.0 - 34.0 pg   MCHC 33.8 30.0 - 36.0 g/dL   RDW 19.6 22.2 - 97.9 %   Platelets 223 150 - 400 K/uL   nRBC 0.0 0.0 - 0.2 %   Neutrophils Relative % 56 %   Neutro Abs 3.0 1.7 - 7.7 K/uL   Lymphocytes Relative 31 %   Lymphs  Abs 1.6 0.7 - 4.0 K/uL   Monocytes Relative 13 %   Monocytes Absolute 0.7 0.1 - 1.0 K/uL   Eosinophils Relative 0 %   Eosinophils Absolute 0.0 0.0 - 0.5 K/uL   Basophils Relative 0 %   Basophils Absolute 0.0 0.0 - 0.1 K/uL   Immature Granulocytes 0 %   Abs Immature Granulocytes 0.01 0.00 - 0.07 K/uL    Comment: Performed at [facility], [address] [place], [place] [address]  Lithium level     Status: Abnormal   Collection Time: 04/28/22 12:43 PM  Result Value Ref Range   Lithium Lvl 0.58 (L) 0.60 - 1.20 mmol/L    Comment: Performed at [facility], [address] [place], [place] [address]  Glucose, capillary     Status: Abnormal   Collection Time: 04/28/22  4:02 PM  Result Value Ref Range   Glucose-Capillary 153 (H) 70 - 99 mg/dL    Comment: Glucose reference range applies only to samples taken after fasting for at least 8 hours.  Glucose, capillary     Status: Abnormal   Collection Time: 04/28/22  8:10 PM  Result Value Ref Range   Glucose-Capillary 123 (H) 70 - 99 mg/dL    Comment: Glucose reference range applies only to samples taken after fasting for at least 8 hours.  Glucose, capillary      Status: Abnormal   Collection Time: 04/29/22  8:08 AM  Result Value Ref Range   Glucose-Capillary 190 (H) 70 - 99 mg/dL    Comment: Glucose reference range applies only to samples taken after fasting for at least 8 hours.    Blood Alcohol level:  Lab Results  Component Value Date   ETH <10 04/26/2022   ETH <10 [id number]    Metabolic Disorder Labs: Lab Results  Component Value Date   HGBA1C 4.9 04/26/2022   MPG 93.93 04/26/2022   MPG 108 11/12/2020   Lab Results  Component Value Date   PROLACTIN 2.0 (L) 08/31/2016   PROLACTIN 4.8 05/22/2016   Lab Results  Component Value Date   CHOL 163 04/28/2022   TRIG 378 (H) 04/28/2022   HDL 35 (L) 04/28/2022   CHOLHDL 4.7 04/28/2022   VLDL 76 (H) 04/28/2022   LDLCALC 52 04/28/2022   LDLCALC UNABLE TO CALCULATE IF TRIGLYCERIDE OVER 400 mg/dL 09/20/2020    Physical Findings: AIMS: Facial and Oral Movements Muscles of Facial Expression: None, normal Lips and Perioral Area: None, normal Jaw: None, normal Tongue: None, normal,Extremity Movements Upper (arms, wrists, hands, fingers): None, normal Lower (legs, knees, ankles, toes): None, normal, Trunk Movements Neck, shoulders, hips: None, normal, Overall Severity Severity of abnormal movements (highest score from questions above): None, normal Incapacitation due to abnormal movements: None, normal Patient's awareness of abnormal movements (rate only patient's report): No Awareness, Dental Status Current problems with teeth and/or dentures?: No Does patient usually wear dentures?: No  CIWA:    COWS:     Musculoskeletal: Strength & Muscle Tone: within normal limits Gait & Station: normal Patient leans: N/A  Psychiatric Specialty Exam:  Presentation  General Appearance:  Bizarre  Eye Contact: Fair  Speech: Clear and Coherent  Speech Volume: Decreased  Handedness: Right   Mood and Affect  Mood: Anxious; Irritable  Affect: Congruent   Thought Process   Thought Processes: Disorganized  Descriptions of Associations:Intact  Orientation:Full (Time, Place and Person)  Thought Content:Illogical; Paranoid Ideation  History of Schizophrenia/Schizoaffective disorder:Yes  Duration of Psychotic Symptoms:Greater than six months  Hallucinations:No data recorded Ideas of Reference:Paranoia  Suicidal Thoughts:No data recorded Homicidal Thoughts:No data recorded  Sensorium  Memory: Immediate Fair  Judgment: Impaired  Insight: Lacking   Executive Functions  Concentration: Poor  Attention Span: Poor  Recall: Poor  Fund of Knowledge: Fair  Language: Fair   Psychomotor Activity  Psychomotor Activity:No data recorded  Assets  Assets: Communication Skills; Financial Resources/Insurance; Housing   Sleep  Sleep:No data recorded   Physical Exam: Physical Exam Vitals and nursing note reviewed.  Constitutional:      Appearance: Normal appearance.  HENT:     Head: Normocephalic and atraumatic.     Mouth/Throat:     Pharynx: Oropharynx is clear.  Eyes:     Pupils: Pupils are equal, round, and reactive to light.  Cardiovascular:     Rate and Rhythm: Normal rate and regular rhythm.  Pulmonary:     Effort: Pulmonary effort is normal.     Breath sounds: Normal breath sounds.  Abdominal:     General: Abdomen is flat.     Palpations: Abdomen is soft.  Musculoskeletal:        General: Normal range of motion.  Skin:    General: Skin is warm and dry.  Neurological:     General: No focal deficit present.     Mental Status: He is alert. Mental status is at baseline.  Psychiatric:        Attention and Perception: Attention normal.        Mood and Affect: Mood is anxious.        Speech: Speech normal.        Behavior: Behavior normal.        Thought Content: Thought content normal.        Cognition and Memory: Cognition normal.        Judgment: Judgment normal.    Review of Systems  Constitutional: Negative.    HENT: Negative.    Eyes: Negative.   Respiratory: Negative.    Cardiovascular: Negative.   Gastrointestinal: Negative.   Musculoskeletal: Negative.   Skin: Negative.   Neurological: Negative.   Psychiatric/Behavioral:  Positive for hallucinations. Negative for depression, substance abuse and suicidal ideas. The patient is nervous/anxious.    Blood pressure 97/63, pulse 66, temperature 98 F (36.7 C), temperature source Oral, resp. rate 18, height 5' 10.5" (1.791 m), weight 73 kg, SpO2 98 %. Body mass index is 22.77 kg/m.   Treatment Plan Summary: Daily contact with patient to assess and evaluate symptoms and progress in treatment, Medication management, and Plan sat down with patient and went over all of his medications.  He has been on a similar combination for a long time but his current dosages are significantly lower than what they used to be.  Several years ago he was on as much as 500 mg of clozapine and currently he is only on 100-150.  I suggested to him that the most reasonable thing to do based on his history was to gently start increasing his dose of clozapine again.  He agreed to that.  He also used to be on a higher dose of lithium and his most recent lithium levels have been on the low side.  For some reason he was on 300 in the morning and 450 at night when he came in on changing that to 450 twice a day.  He is fine with that.  I have also added an order for clonazepam that can be provided as needed for anxiety and  agitation.  If this proves useful I would not be completely against making it a routine medicine.  Supportive counseling and review plan with patient who is agreeable  Alethia Berthold, MD 04/29/2022, 3:46 PM

## 2022-04-29 NOTE — Plan of Care (Signed)
  Problem: Education: Goal: Knowledge of General Education information will improve Description: Including pain rating scale, medication(s)/side effects and non-pharmacologic comfort measures Outcome: Progressing   Problem: Health Behavior/Discharge Planning: Goal: Ability to manage health-related needs will improve Outcome: Progressing   Problem: Health Behavior/Discharge Planning: Goal: Ability to manage health-related needs will improve Outcome: Progressing   Problem: Clinical Measurements: Goal: Ability to maintain clinical measurements within normal limits will improve Outcome: Progressing Goal: Will remain free from infection Outcome: Progressing Goal: Diagnostic test results will improve Outcome: Progressing Goal: Respiratory complications will improve Outcome: Progressing Goal: Cardiovascular complication will be avoided Outcome: Progressing   Problem: Safety: Goal: Periods of time without injury will increase Outcome: Progressing   Problem: Physical Regulation: Goal: Ability to maintain clinical measurements within normal limits will improve Outcome: Progressing   Problem: Health Behavior/Discharge Planning: Goal: Identification of resources available to assist in meeting health care needs will improve Outcome: Progressing Goal: Compliance with treatment plan for underlying cause of condition will improve Outcome: Progressing   Problem: Coping: Goal: Ability to verbalize frustrations and anger appropriately will improve Outcome: Progressing Goal: Ability to demonstrate self-control will improve Outcome: Progressing

## 2022-04-29 NOTE — Progress Notes (Signed)
Blood pressure medication held tonight due to low BP reading.  All other medication received without issue. Denies si  hi  avh. Denies having any pain in his hand. Has again removed the splint complains that it restricts his movement. Paces he unit from time to  ime.  No issues or concerns. Q15 minute safety checks in place.     C Butler-Nicholson, LPN

## 2022-04-30 DIAGNOSIS — F203 Undifferentiated schizophrenia: Secondary | ICD-10-CM | POA: Diagnosis not present

## 2022-04-30 LAB — CLOZAPINE (CLOZARIL)
Clozapine Lvl: 74 ng/mL — ABNORMAL LOW (ref 350–600)
NorClozapine: 69 ng/mL
Total(Cloz+Norcloz): 143 ng/mL

## 2022-04-30 LAB — GLUCOSE, CAPILLARY
Glucose-Capillary: 105 mg/dL — ABNORMAL HIGH (ref 70–99)
Glucose-Capillary: 88 mg/dL (ref 70–99)
Glucose-Capillary: 102 mg/dL — ABNORMAL HIGH (ref 70–99)

## 2022-04-30 MED ORDER — CLOZAPINE 100 MG PO TABS
200.0000 mg | ORAL_TABLET | Freq: Every day | ORAL | Status: DC
  Administered 2022-04-30 – 2022-05-04 (×5): 200 mg via ORAL
  Filled 2022-04-30 (×5): qty 2

## 2022-04-30 NOTE — Progress Notes (Signed)
Recreation Therapy Notes    Date: 04/30/2022   Time: 2:05pm   Location: Courtyard    Behavioral response: Appropriate   Group Type: Recreation and Leisure   Participation level: Active   Communication: Patient was social with peers and staff.   Comments: N/A   [name] LRT/CTRS          Lance [name] 04/30/2022 3:05 PM 

## 2022-04-30 NOTE — Progress Notes (Signed)
Recreation Therapy Notes  Date: 04/30/2022  Time: 10:35 am     Location: Craft room   Behavioral response: N/A   Intervention Topic: Happiness    Discussion/Intervention: Patient refused to attend group.   Clinical Observations/Feedback:  Patient refused to attend group.    Toa Mia LRT/CTRS        Odella Appelhans 04/30/2022 12:32 PM

## 2022-04-30 NOTE — Plan of Care (Signed)
D: Patient alert and oriented. Patient endorses tooth pain 3/10. Patient denies anxiety and depression. Patient denies SI/HI/AVH. Patient frequently observed around the milieu.  A: Scheduled medications administered to patient, per MD orders.  Support and encouragement provided to patient.  Q15 minute safety checks maintained.   R: Patient compliant with medication administration and treatment plan. No adverse drug reactions noted. Patient remains safe on the unit at this time. Problem: Education: Goal: Knowledge of [place] General Education information/materials will improve Outcome: Progressing Goal: Verbalization of understanding the information provided will improve Outcome: Progressing   Problem: Health Behavior/Discharge Planning: Goal: Compliance with treatment plan for underlying cause of condition will improve Outcome: Progressing   Problem: Physical Regulation: Goal: Ability to maintain clinical measurements within normal limits will improve Outcome: Progressing   Problem: Safety: Goal: Periods of time without injury will increase Outcome: Progressing

## 2022-04-30 NOTE — Consult Note (Signed)
PHARMACIST - PHYSICIAN ORDER COMMUNICATION  Lance [name] is a 45 y.o. year old male with a history of schizophrenia on Clozapine PTA. Continuing this medication order as an inpatient requires that monitoring parameters per REMS requirements must be met.   Clozapine REMS Dispense Authorization was obtained, and will dispense inpatient.  RDA code [id number] .  Verified Clozapine dose: 110m by mouth daily  Last ANC value and date reported on the Clozapine REMS website: 11/10 ABremermonitoring frequency: monthly Next AVigoreporting is due on (date) 12/10.  ADarrick Penna11/16/2023, 12:03 PM

## 2022-04-30 NOTE — Progress Notes (Signed)
[facility]  04/30/2022 8:11 PM Manson AllanJames Paul Mackowski  MRN:  [id number] Subjective: Follow-up patient with schizophrenia.  Patient was calm and appropriate in his behavior today.  Had a nice conversation with him about some of his longstanding concerns and things he wants to get out of life.  Patient is not expressing any threatening or hostile thoughts or behavior Principal Problem: Schizophrenia (HCC) Diagnosis: Principal Problem:   Schizophrenia (HCC) Active Problems:   Hypertension   Diabetes mellitus without complication (HCC)   Hyperlipidemia   Chronic anticoagulation   Schizoaffective disorder, bipolar type (HCC)   Tobacco use disorder   Boxer's metacarpal fracture, neck, closed  Total Time spent with patient: 30 minutes  Past Psychiatric History: Long history of schizophrenia  Past Medical History:  Past Medical History:  Diagnosis Date   Depression    Diabetes mellitus without complication (HCC)    Hyperlipidemia    Hypertension    Lupus anticoagulant disorder (HCC) 06/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   PE (pulmonary thromboembolism) (HCC)    Schizo affective schizophrenia (HCC)    Supratherapeutic INR 11/19/2016   History reviewed. No pertinent surgical history. Family History:  Family History  Problem Relation Age of Onset   CAD Mother    CAD Sister    Family Psychiatric  History: See previous Social History:  Social History   Substance and Sexual Activity  Alcohol Use Yes   Comment: occassionally     Social History   Substance and Sexual Activity  Drug Use Not Currently   Types: Marijuana   Comment: Last use 11/29/16    Social History   Socioeconomic History   Marital status: Single    Spouse name: Not on file   Number of children: Not on file   Years of education: Not on file   Highest education level: Not on file  Occupational History   Not on file  Tobacco Use   Smoking status: Every Day    Packs/day: 1.00    Years:  23.00    Total pack years: 23.00    Types: Cigarettes   Smokeless tobacco: Never   Tobacco comments:    will provide material  Substance and Sexual Activity   Alcohol use: Yes    Comment: occassionally   Drug use: Not Currently    Types: Marijuana    Comment: Last use 11/29/16   Sexual activity: Never    Birth control/protection: None    Comment: occasional marijuana- none recently  Other Topics Concern   Not on file  Social History Narrative   [place]   Social Determinants of Health   Financial Resource Strain: Low Risk  (03/20/2021)   Overall Financial Resource Strain (CARDIA)    Difficulty of Paying Living Expenses: Not very hard  Food Insecurity: No Food Insecurity (04/27/2022)   Hunger Vital Sign    Worried About Running Out of Food in the Last Year: Never true    Ran Out of Food in the Last Year: Never true  Transportation Needs: No Transportation Needs (04/27/2022)   PRAPARE - Administrator, Civil ServiceTransportation    Lack of Transportation (Medical): No    Lack of Transportation (Non-Medical): No  Physical Activity: Insufficiently Active (03/20/2021)   Exercise Vital Sign    Days of Exercise per Week: 3 days    Minutes of Exercise per Session: 20 min  Stress: No Stress Concern Present (03/20/2021)   [employer] - Occupational  Stress Questionnaire    Feeling of Stress : Only a little  Social Connections: Socially Isolated (03/20/2021)   Social Connection and Isolation Panel [NHANES]    Frequency of Communication with Friends and Family: More than three times a week    Frequency of Social Gatherings with Friends and Family: More than three times a week    Attends Religious Services: Never    Database administrator or Organizations: No    Attends Engineer, structural: Never    Marital Status: Never married   Additional Social History:                         Sleep: Fair  Appetite:  Fair  Current  Medications: Current Facility-Administered Medications  Medication Dose Route Frequency Provider Last Rate Last Admin   acetaminophen (TYLENOL) tablet 650 mg  650 mg Oral Q6H PRN Jearld Lesch, NP   650 mg at 04/29/22 1645   alum & mag hydroxide-simeth (MAALOX/MYLANTA) 200-200-20 MG/5ML suspension 30 mL  30 mL Oral Q4H PRN Jearld Lesch, NP       apixaban (ELIQUIS) tablet 5 mg  5 mg Oral BID Sailor Hevia T, MD   5 mg at 04/30/22 1655   clonazePAM (KLONOPIN) disintegrating tablet 0.25 mg  0.25 mg Oral BID PRN Micca Matura, [name], MD       cloZAPine (CLOZARIL) tablet 200 mg  200 mg Oral QHS Quantrell Splitt T, MD       hydrOXYzine (ATARAX) tablet 25 mg  25 mg Oral TID PRN Jearld Lesch, NP       insulin detemir (LEVEMIR) injection 10 Units  10 Units Subcutaneous BID Deondray Ospina, [name], MD   10 Units at 04/30/22 1713   lisinopril (ZESTRIL) tablet 2.5 mg  2.5 mg Oral Daily Castella Lerner T, MD   2.5 mg at 04/30/22 0813   lithium carbonate capsule 450 mg  450 mg Oral BID WC Galilea Quito T, MD   450 mg at 04/30/22 1653   magnesium hydroxide (MILK OF MAGNESIA) suspension 30 mL  30 mL Oral Daily PRN Jearld Lesch, NP       metFORMIN (GLUCOPHAGE) tablet 1,000 mg  1,000 mg Oral BID WC Seva Chancy T, MD   1,000 mg at 04/30/22 1653   multivitamin with minerals tablet 1 tablet  1 tablet Oral Daily Laquetta Racey, [name], MD   1 tablet at 04/30/22 0813   OLANZapine (ZYPREXA) tablet 20 mg  20 mg Oral QHS Vinal Rosengrant T, MD   20 mg at 04/29/22 2106   propranolol (INDERAL) tablet 20 mg  20 mg Oral QHS Janayia Burggraf T, MD   20 mg at 04/27/22 2106   sertraline (ZOLOFT) tablet 100 mg  100 mg Oral Daily Bebe Moncure T, MD   100 mg at 04/30/22 0813   simvastatin (ZOCOR) tablet 40 mg  40 mg Oral q1800 [place] Severs T, MD   40 mg at 04/30/22 1900   traZODone (DESYREL) tablet 100 mg  100 mg Oral QHS PRN Markiah Janeway, [name], MD   100 mg at 04/29/22 2106    Lab Results:  Results for orders placed or performed during the  hospital encounter of 04/27/22 (from the past 48 hour(s))  Glucose, capillary     Status: Abnormal   Collection Time: 04/29/22  8:08 AM  Result Value Ref Range   Glucose-Capillary 190 (H) 70 - 99 mg/dL    Comment: Glucose reference  range applies only to samples taken after fasting for at least 8 hours.  Clozapine (clozaril)     Status: Abnormal   Collection Time: 04/29/22 11:21 AM  Result Value Ref Range   Clozapine Lvl 74 (L) 350 - 600 ng/mL    Comment: (NOTE) This test was developed and its performance characteristics determined by [facility]. It has not been cleared or approved by the Food and Drug Administration.    NorClozapine 69 Not Estab. ng/mL    Comment: (NOTE) This test was developed and its performance characteristics determined by [facility]. It has not been cleared or approved by the Food and Drug Administration.    Total(Cloz+Norcloz) 143 ng/mL    Comment: (NOTE) Plasma concentrations of clozapine plus norclozapine (combined total) greater than 450 ng/mL have been associated with therapeutic effect.                                Detection Limit = 20 Performed At: [facility] [address] [place], [place] [id number] Jolene Schimke MD [id number]   Glucose, capillary     Status: Abnormal   Collection Time: 04/29/22  4:43 PM  Result Value Ref Range   Glucose-Capillary 153 (H) 70 - 99 mg/dL    Comment: Glucose reference range applies only to samples taken after fasting for at least 8 hours.  Glucose, capillary     Status: Abnormal   Collection Time: 04/30/22  6:35 AM  Result Value Ref Range   Glucose-Capillary 105 (H) 70 - 99 mg/dL    Comment: Glucose reference range applies only to samples taken after fasting for at least 8 hours.  Glucose, capillary     Status: None   Collection Time: 04/30/22 11:35 AM  Result Value Ref Range   Glucose-Capillary 88 70 - 99 mg/dL    Comment: Glucose reference range applies only to samples taken after fasting for at  least 8 hours.  Glucose, capillary     Status: Abnormal   Collection Time: 04/30/22  4:28 PM  Result Value Ref Range   Glucose-Capillary 102 (H) 70 - 99 mg/dL    Comment: Glucose reference range applies only to samples taken after fasting for at least 8 hours.    Blood Alcohol level:  Lab Results  Component Value Date   ETH <10 04/26/2022   ETH <10 11/22/2021    Metabolic Disorder Labs: Lab Results  Component Value Date   HGBA1C 4.9 04/26/2022   MPG 93.93 04/26/2022   MPG 108 11/12/2020   Lab Results  Component Value Date   PROLACTIN 2.0 (L) 08/31/2016   PROLACTIN 4.8 05/22/2016   Lab Results  Component Value Date   CHOL 163 04/28/2022   TRIG 378 (H) 04/28/2022   HDL 35 (L) 04/28/2022   CHOLHDL 4.7 04/28/2022   VLDL 76 (H) 04/28/2022   LDLCALC 52 04/28/2022   LDLCALC UNABLE TO CALCULATE IF TRIGLYCERIDE OVER 400 mg/dL 62/69/4854    Physical Findings: AIMS: Facial and Oral Movements Muscles of Facial Expression: None, normal Lips and Perioral Area: None, normal Jaw: None, normal Tongue: None, normal,Extremity Movements Upper (arms, wrists, hands, fingers): None, normal Lower (legs, knees, ankles, toes): None, normal, Trunk Movements Neck, shoulders, hips: None, normal, Overall Severity Severity of abnormal movements (highest score from questions above): None, normal Incapacitation due to abnormal movements: None, normal Patient's awareness of abnormal movements (rate only patient's report): No Awareness, Dental Status Current problems with teeth and/or dentures?:  No Does patient usually wear dentures?: No  CIWA:    COWS:     Musculoskeletal: Strength & Muscle Tone: within normal limits Gait & Station: normal Patient leans: N/A  Psychiatric Specialty Exam:  Presentation  General Appearance:  Bizarre  Eye Contact: Fair  Speech: Clear and Coherent  Speech Volume: Decreased  Handedness: Right   Mood and Affect  Mood: Anxious;  Irritable  Affect: Congruent   Thought Process  Thought Processes: Disorganized  Descriptions of Associations:Intact  Orientation:Full (Time, Place and Person)  Thought Content:Illogical; Paranoid Ideation  History of Schizophrenia/Schizoaffective disorder:Yes  Duration of Psychotic Symptoms:Greater than six months  Hallucinations:No data recorded Ideas of Reference:Paranoia  Suicidal Thoughts:No data recorded Homicidal Thoughts:No data recorded  Sensorium  Memory: Immediate Fair  Judgment: Impaired  Insight: Lacking   Executive Functions  Concentration: Poor  Attention Span: Poor  Recall: Poor  Fund of Knowledge: Fair  Language: Fair   Psychomotor Activity  Psychomotor Activity:No data recorded  Assets  Assets: Communication Skills; Financial Resources/Insurance; Housing   Sleep  Sleep:No data recorded   Physical Exam: Physical Exam Vitals reviewed.  Constitutional:      Appearance: Normal appearance.  HENT:     Head: Normocephalic and atraumatic.     Mouth/Throat:     Pharynx: Oropharynx is clear.  Eyes:     Pupils: Pupils are equal, round, and reactive to light.  Cardiovascular:     Rate and Rhythm: Normal rate and regular rhythm.  Pulmonary:     Effort: Pulmonary effort is normal.     Breath sounds: Normal breath sounds.  Abdominal:     General: Abdomen is flat.     Palpations: Abdomen is soft.  Musculoskeletal:        General: Normal range of motion.  Skin:    General: Skin is warm and dry.  Neurological:     General: No focal deficit present.     Mental Status: He is alert. Mental status is at baseline.  Psychiatric:        Mood and Affect: Mood normal.        Thought Content: Thought content normal.    Review of Systems  Constitutional: Negative.   HENT: Negative.    Eyes: Negative.   Respiratory: Negative.    Cardiovascular: Negative.   Gastrointestinal: Negative.   Musculoskeletal: Negative.   Skin:  Negative.   Neurological: Negative.   Psychiatric/Behavioral: Negative.     Blood pressure 114/69, pulse 80, temperature 98.3 F (36.8 C), resp. rate 18, height 5' 10.5" (1.791 m), weight 73 kg, SpO2 100 %. Body mass index is 22.77 kg/m.   Treatment Plan Summary: Plan increase dose of clozapine to 200 mg.  Continue other medicines.  Supportive therapy counseling and encourage patient and his attendance at groups.  Mordecai Rasmussen, MD 04/30/2022, 8:11 PM

## 2022-04-30 NOTE — Group Note (Signed)
[facility] LCSW Group Therapy Note   Group Date: 04/30/2022 Start Time: 1300 End Time: 1400   Type of Therapy/Topic:  Group Therapy:  Emotion Regulation  Participation Level:  Minimal   Mood: depressed    Description of Group:    The purpose of this group is to assist patients in learning to regulate negative emotions and experience positive emotions. Patients will be guided to discuss ways in which they have been vulnerable to their negative emotions. These vulnerabilities will be juxtaposed with experiences of positive emotions or situations, and patients challenged to use positive emotions to combat negative ones. Special emphasis will be placed on coping with negative emotions in conflict situations, and patients will process healthy conflict resolution skills.  Therapeutic Goals: Patient will identify two positive emotions or experiences to reflect on in order to balance out negative emotions:  Patient will label two or more emotions that they find the most difficult to experience:  Patient will be able to demonstrate positive conflict resolution skills through discussion or role plays:   Summary of Patient Progress:   Patient was present for the entirety of group session. Patient participated in opening and closing remarks. However, patient did not contribute at all to the topic of discussion despite encouraged participation.     Therapeutic Modalities:   Cognitive Behavioral Therapy Feelings Identification Dialectical Behavioral Therapy   Corky Crafts, [place]

## 2022-05-01 DIAGNOSIS — F203 Undifferentiated schizophrenia: Secondary | ICD-10-CM | POA: Diagnosis not present

## 2022-05-01 LAB — GLUCOSE, CAPILLARY
Glucose-Capillary: 112 mg/dL — ABNORMAL HIGH (ref 70–99)
Glucose-Capillary: 152 mg/dL — ABNORMAL HIGH (ref 70–99)

## 2022-05-01 MED ORDER — IBUPROFEN 600 MG PO TABS
600.0000 mg | ORAL_TABLET | Freq: Four times a day (QID) | ORAL | Status: DC | PRN
  Administered 2022-05-01 – 2022-05-03 (×3): 600 mg via ORAL
  Filled 2022-05-01 (×4): qty 1

## 2022-05-01 NOTE — Progress Notes (Signed)
[facility] MD Progress Note  05/01/2022 3:15 PM Lance [name]  MRN:  [id number] Subjective: Follow-up 46 year old man with schizophrenia.  No new complaints today.  Reports that he is feeling better.  Slept better last night.  Not feeling paranoid or hostile.  Tolerating being in groups and being around other people well. Principal Problem: Schizophrenia (HCC) Diagnosis: Principal Problem:   Schizophrenia (HCC) Active Problems:   Hypertension   Diabetes mellitus without complication (HCC)   Hyperlipidemia   Chronic anticoagulation   Schizoaffective disorder, bipolar type (HCC)   Tobacco use disorder   Boxer's metacarpal fracture, neck, closed  Total Time spent with patient: 30 minutes  Past Psychiatric History: Past history of longstanding schizophrenia  Past Medical History:  Past Medical History:  Diagnosis Date   Depression    Diabetes mellitus without complication (HCC)    Hyperlipidemia    Hypertension    Lupus anticoagulant disorder (HCC) 06/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   PE (pulmonary thromboembolism) (HCC)    Schizo affective schizophrenia (HCC)    Supratherapeutic INR 11/19/2016   History reviewed. No pertinent surgical history. Family History:  Family History  Problem Relation Age of Onset   CAD Mother    CAD Sister    Family Psychiatric  History: See previous Social History:  Social History   Substance and Sexual Activity  Alcohol Use Yes   Comment: occassionally     Social History   Substance and Sexual Activity  Drug Use Not Currently   Types: Marijuana   Comment: Last use 11/29/16    Social History   Socioeconomic History   Marital status: Single    Spouse name: Not on file   Number of children: Not on file   Years of education: Not on file   Highest education level: Not on file  Occupational History   Not on file  Tobacco Use   Smoking status: Every Day    Packs/day: 1.00    Years: 23.00    Total pack years: 23.00     Types: Cigarettes   Smokeless tobacco: Never   Tobacco comments:    will provide material  Substance and Sexual Activity   Alcohol use: Yes    Comment: occassionally   Drug use: Not Currently    Types: Marijuana    Comment: Last use 11/29/16   Sexual activity: Never    Birth control/protection: None    Comment: occasional marijuana- none recently  Other Topics Concern   Not on file  Social History Narrative   From a group home in [place]   Social Determinants of Health   Financial Resource Strain: Low Risk  (03/20/2021)   Overall Financial Resource Strain (CARDIA)    Difficulty of Paying Living Expenses: Not very hard  Food Insecurity: No Food Insecurity (04/27/2022)   Hunger Vital Sign    Worried About Running Out of Food in the Last Year: Never true    Ran Out of Food in the Last Year: Never true  Transportation Needs: No Transportation Needs (04/27/2022)   PRAPARE - Administrator, Civil Service (Medical): No    Lack of Transportation (Non-Medical): No  Physical Activity: Insufficiently Active (03/20/2021)   Exercise Vital Sign    Days of Exercise per Week: 3 days    Minutes of Exercise per Session: 20 min  Stress: No Stress Concern Present (03/20/2021)   [employer] of Occupational Health - Occupational Stress Questionnaire    Feeling of Stress :  Only a little  Social Connections: Socially Isolated (03/20/2021)   Social Connection and Isolation Panel [NHANES]    Frequency of Communication with Friends and Family: More than three times a week    Frequency of Social Gatherings with Friends and Family: More than three times a week    Attends Religious Services: Never    Database administrator or Organizations: No    Attends Engineer, structural: Never    Marital Status: Never married   Additional Social History:                         Sleep: Fair  Appetite:  Fair  Current Medications: Current Facility-Administered Medications   Medication Dose Route Frequency Provider Last Rate Last Admin   acetaminophen (TYLENOL) tablet 650 mg  650 mg Oral Q6H PRN Jearld Lesch, NP   650 mg at 05/01/22 0800   alum & mag hydroxide-simeth (MAALOX/MYLANTA) 200-200-20 MG/5ML suspension 30 mL  30 mL Oral Q4H PRN Dixon, Rashaun M, NP       apixaban (ELIQUIS) tablet 5 mg  5 mg Oral BID Torunn Chancellor T, MD   5 mg at 05/01/22 0801   clonazePAM (KLONOPIN) disintegrating tablet 0.25 mg  0.25 mg Oral BID PRN Hanan Moen, [name], MD       cloZAPine (CLOZARIL) tablet 200 mg  200 mg Oral QHS Elpidio Thielen T, MD   200 mg at 04/30/22 2115   hydrOXYzine (ATARAX) tablet 25 mg  25 mg Oral TID PRN Jearld Lesch, NP       insulin detemir (LEVEMIR) injection 10 Units  10 Units Subcutaneous BID Estera Ozier, [name], MD   10 Units at 05/01/22 0802   lisinopril (ZESTRIL) tablet 2.5 mg  2.5 mg Oral Daily Katilyn Miltenberger T, MD   2.5 mg at 04/30/22 0813   lithium carbonate capsule 450 mg  450 mg Oral BID WC Decarla Siemen T, MD   450 mg at 05/01/22 0800   magnesium hydroxide (MILK OF MAGNESIA) suspension 30 mL  30 mL Oral Daily PRN Jearld Lesch, NP       metFORMIN (GLUCOPHAGE) tablet 1,000 mg  1,000 mg Oral BID WC Sheneika Walstad T, MD   1,000 mg at 05/01/22 0800   multivitamin with minerals tablet 1 tablet  1 tablet Oral Daily Nicholle Falzon, [name], MD   1 tablet at 05/01/22 0800   OLANZapine (ZYPREXA) tablet 20 mg  20 mg Oral QHS Spencer Cardinal T, MD   20 mg at 04/30/22 2115   propranolol (INDERAL) tablet 20 mg  20 mg Oral QHS Tanish Prien T, MD   20 mg at 04/30/22 2115   sertraline (ZOLOFT) tablet 100 mg  100 mg Oral Daily Lakecia Deschamps T, MD   100 mg at 05/01/22 0800   simvastatin (ZOCOR) tablet 40 mg  40 mg Oral q1800 Drelyn Pistilli T, MD   40 mg at 04/30/22 1900   traZODone (DESYREL) tablet 100 mg  100 mg Oral QHS PRN Yasseen Salls, [name], MD   100 mg at 04/30/22 2117    Lab Results:  Results for orders placed or performed during the hospital encounter of 04/27/22 (from  the past 48 hour(s))  Glucose, capillary     Status: Abnormal   Collection Time: 04/29/22  4:43 PM  Result Value Ref Range   Glucose-Capillary 153 (H) 70 - 99 mg/dL    Comment: Glucose reference range applies only to samples taken  after fasting for at least 8 hours.  Glucose, capillary     Status: Abnormal   Collection Time: 04/30/22  6:35 AM  Result Value Ref Range   Glucose-Capillary 105 (H) 70 - 99 mg/dL    Comment: Glucose reference range applies only to samples taken after fasting for at least 8 hours.  Glucose, capillary     Status: None   Collection Time: 04/30/22 11:35 AM  Result Value Ref Range   Glucose-Capillary 88 70 - 99 mg/dL    Comment: Glucose reference range applies only to samples taken after fasting for at least 8 hours.  Glucose, capillary     Status: Abnormal   Collection Time: 04/30/22  4:28 PM  Result Value Ref Range   Glucose-Capillary 102 (H) 70 - 99 mg/dL    Comment: Glucose reference range applies only to samples taken after fasting for at least 8 hours.  Glucose, capillary     Status: Abnormal   Collection Time: 05/01/22  6:44 AM  Result Value Ref Range   Glucose-Capillary 112 (H) 70 - 99 mg/dL    Comment: Glucose reference range applies only to samples taken after fasting for at least 8 hours.    Blood Alcohol level:  Lab Results  Component Value Date   ETH <10 04/26/2022   ETH <10 11/22/2021    Metabolic Disorder Labs: Lab Results  Component Value Date   HGBA1C 4.9 04/26/2022   MPG 93.93 04/26/2022   MPG 108 11/12/2020   Lab Results  Component Value Date   PROLACTIN 2.0 (L) 08/31/2016   PROLACTIN 4.8 05/22/2016   Lab Results  Component Value Date   CHOL 163 04/28/2022   TRIG 378 (H) 04/28/2022   HDL 35 (L) 04/28/2022   CHOLHDL 4.7 04/28/2022   VLDL 76 (H) 04/28/2022   LDLCALC 52 04/28/2022   LDLCALC UNABLE TO CALCULATE IF TRIGLYCERIDE OVER 400 mg/dL 81/19/147804/01/2021    Physical Findings: AIMS: Facial and Oral Movements Muscles of  Facial Expression: None, normal Lips and Perioral Area: None, normal Jaw: None, normal Tongue: None, normal,Extremity Movements Upper (arms, wrists, hands, fingers): None, normal Lower (legs, knees, ankles, toes): None, normal, Trunk Movements Neck, shoulders, hips: None, normal, Overall Severity Severity of abnormal movements (highest score from questions above): None, normal Incapacitation due to abnormal movements: None, normal Patient's awareness of abnormal movements (rate only patient's report): No Awareness, Dental Status Current problems with teeth and/or dentures?: No Does patient usually wear dentures?: No  CIWA:    COWS:     Musculoskeletal: Strength & Muscle Tone: within normal limits Gait & Station: normal Patient leans: N/A  Psychiatric Specialty Exam:  Presentation  General Appearance:  Bizarre  Eye Contact: Fair  Speech: Clear and Coherent  Speech Volume: Decreased  Handedness: Right   Mood and Affect  Mood: Anxious; Irritable  Affect: Congruent   Thought Process  Thought Processes: Disorganized  Descriptions of Associations:Intact  Orientation:Full (Time, Place and Person)  Thought Content:Illogical; Paranoid Ideation  History of Schizophrenia/Schizoaffective disorder:Yes  Duration of Psychotic Symptoms:Greater than six months  Hallucinations:No data recorded Ideas of Reference:Paranoia  Suicidal Thoughts:No data recorded Homicidal Thoughts:No data recorded  Sensorium  Memory: Immediate Fair  Judgment: Impaired  Insight: Lacking   Executive Functions  Concentration: Poor  Attention Span: Poor  Recall: Poor  Fund of Knowledge: Fair  Language: Fair   Psychomotor Activity  Psychomotor Activity:No data recorded  Assets  Assets: Communication Skills; Financial Resources/Insurance; Housing   Sleep  Sleep:No data recorded  Physical Exam: Physical Exam Vitals and nursing note reviewed.   Constitutional:      Appearance: Normal appearance.  HENT:     Head: Normocephalic and atraumatic.     Mouth/Throat:     Pharynx: Oropharynx is clear.  Eyes:     Pupils: Pupils are equal, round, and reactive to light.  Cardiovascular:     Rate and Rhythm: Normal rate and regular rhythm.  Pulmonary:     Effort: Pulmonary effort is normal.     Breath sounds: Normal breath sounds.  Abdominal:     General: Abdomen is flat.     Palpations: Abdomen is soft.  Musculoskeletal:        General: Normal range of motion.  Skin:    General: Skin is warm and dry.  Neurological:     General: No focal deficit present.     Mental Status: He is alert. Mental status is at baseline.  Psychiatric:        Attention and Perception: Attention normal.        Mood and Affect: Mood normal.        Speech: Speech normal.        Behavior: Behavior normal.        Thought Content: Thought content normal.        Cognition and Memory: Cognition normal.        Judgment: Judgment normal.    Review of Systems  Constitutional: Negative.   HENT: Negative.    Eyes: Negative.   Respiratory: Negative.    Cardiovascular: Negative.   Gastrointestinal: Negative.   Musculoskeletal: Negative.   Skin: Negative.   Neurological: Negative.   Psychiatric/Behavioral: Negative.     Blood pressure (!) 103/56, pulse 66, temperature 97.6 F (36.4 C), temperature source Oral, resp. rate 18, height 5' 10.5" (1.791 m), weight 73 kg, SpO2 98 %. Body mass index is 22.77 kg/m.   Treatment Plan Summary: Medication management and Plan no change to medication.  I increased his clozapine yesterday.  No real reason to do it again today.  Supportive therapy and encouragement.  Seems to be doing much better might be ready for discharge after the weekend  Mordecai Rasmussen, MD 05/01/2022, 3:15 PM

## 2022-05-01 NOTE — Group Note (Signed)
[facility] LCSW Group Therapy Note   Group Date: 05/01/2022 Start Time: 1300 End Time: 1400  Type of Therapy and Topic:  Group Therapy:  Feelings around Relapse and Recovery  Participation Level:  Minimal    Description of Group:    Patients in this group will discuss emotions they experience before and after a relapse. They will process how experiencing these feelings, or avoidance of experiencing them, relates to having a relapse. Facilitator will guide patients to explore emotions they have related to recovery. Patients will be encouraged to process which emotions are more powerful. They will be guided to discuss the emotional reaction significant others in their lives may have to patients' relapse or recovery. Patients will be assisted in exploring ways to respond to the emotions of others without this contributing to a relapse.  Therapeutic Goals: Patient will identify two or more emotions that lead to relapse for them:  Patient will identify two emotions that result when they relapse:  Patient will identify two emotions related to recovery:  Patient will demonstrate ability to communicate their needs through discussion and/or role plays.   Summary of Patient Progress: Patient came into the group halfway through the discussion. He did share that he feels that exercise and push ups will help him focus on recovery after he leaves the hospital.    Therapeutic Modalities:   Cognitive Behavioral Therapy Solution-Focused Therapy Assertiveness Training Relapse Prevention Therapy   Glenis Smoker, LCSW

## 2022-05-01 NOTE — Progress Notes (Signed)
Pt was not able to deny or endorse SI/HI/AVH and verbally agrees to approach staff if these become apparent or before harming themselves/others. Pt just stated, "I don't know." When asked to clarify. Rates depression 5/10. Rates anxiety 5/10. Rates pain 5/10. Pt came up to made room and asked, "if the devil is ruler over demons, when will he take them over?" Pt stated that the demons don't bother him but, and then proceeded to ask the same question again. Pt stated that he is evil and will always be evil. Pt has been outside and going to groups. Pt will pace at times. Scheduled medications administered to pt, per MD orders. RN provided support and encouragement to pt. Q15 min safety checks implemented and continued. Pt safe on the unit. RN will continue to monitor and intervene as needed. Problem: Coping: Goal: Level of anxiety will decrease Outcome: Not Progressing   Problem: Education: Goal: Emotional status will improve Outcome: Not Progressing    05/01/22 0800  Psych Admission Type (Psych Patients Only)  Admission Status Involuntary  Psychosocial Assessment  Patient Complaints Anxiety;Depression  Eye Contact Brief  Facial Expression Flat  Affect Anxious;Flat  Speech Logical/coherent;Soft  Interaction Forwards little;Minimal  Motor Activity Slow;Pacing  Appearance/Hygiene Unremarkable  Behavior Characteristics Cooperative;Appropriate to situation;Anxious;Pacing  Mood Anxious;Depressed;Sad  Thought Process  Coherency Circumstantial  Content WDL  Delusions WDL  Perception Hallucinations  Hallucination Auditory;Visual  Judgment Limited  Confusion None  Danger to Self  Current suicidal ideation? Passive  Self-Injurious Behavior No self-injurious ideation or behavior indicators observed or expressed   Agreement Not to Harm Self Yes  Description of Agreement verbal  Danger to Others  Danger to Others None reported or observed

## 2022-05-01 NOTE — Progress Notes (Signed)
Pleasant and cooperative. Paces the unit to pass the time.  He is med compliant and takes his meds without issue.  Complains about a tooth ache rates pain 3/10.  Did not request anything for pain. Will continue to monitor. Advised to let staff know of worsening pain.  Will continue monitor and offer support and encouragement.  Q 15 minute safety checks in place.    C Butler-Nicholson, LPN

## 2022-05-02 DIAGNOSIS — F203 Undifferentiated schizophrenia: Secondary | ICD-10-CM | POA: Diagnosis not present

## 2022-05-02 LAB — GLUCOSE, CAPILLARY: Glucose-Capillary: 193 mg/dL — ABNORMAL HIGH (ref 70–99)

## 2022-05-02 NOTE — Plan of Care (Signed)
Pt is calm and cooperative, slow steady gait, disheveled appearance, compliant with medications, requested pain med PRN for tooth pain, denies SI HI AVH today.  Continued monitoring for safety via q48m checks.

## 2022-05-02 NOTE — Plan of Care (Signed)
Pt was calm and cooperative, endorsing AH and VH stating "I don't know what to do if they pour forth at the same time.  Pt is compliant with medications and denies SI.  Pt c/o mild toothache; PRN provided.  Continued monitoring for safety with q46m checks.
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
## 2022-05-02 NOTE — Plan of Care (Signed)
Assumed care of Pt from 0730. He is visible in the milieu, denied SI/HI/AVH or self harm today. Pt reported feeling "tired and sleepy." Was noted to be falling asleep during assessment and med pass but easily aroused and compliant with meds and care. He denied any issues stating "I am better today." He rested for most of the morning and was up for lunch which he ate 100%. He later was observed sitting in the dayroom watching TV with his peers. He ambulates with a slow steady gait with no falls or unsafe behavior noted thus far. Q15 min observations maintained for safety and support provided as needed.    Problem: Nutrition: Goal: Adequate nutrition will be maintained Outcome: Progressing   Problem: Coping: Goal: Level of anxiety will decrease Outcome: Progressing   Problem: Pain Managment: Goal: General experience of comfort will improve Outcome: Progressing   Problem: Skin Integrity: Goal: Risk for impaired skin integrity will decrease Outcome: Progressing   Problem: Education: Goal: Mental status will improve Outcome: Progressing   Problem: Activity: Goal: Sleeping patterns will improve Outcome: Progressing

## 2022-05-03 DIAGNOSIS — F203 Undifferentiated schizophrenia: Secondary | ICD-10-CM | POA: Diagnosis not present

## 2022-05-03 LAB — GLUCOSE, CAPILLARY: Glucose-Capillary: 239 mg/dL — ABNORMAL HIGH (ref 70–99)

## 2022-05-03 NOTE — Plan of Care (Signed)
Pt endorses anxiety/depression at this time. Pt denies SI/HI/AVH or pain at this time. Pt is calm and cooperative. Pt is medication compliant. Pt provided with support and encouragement. Pt monitored q15 minutes for safety per unit policy. Plan of care ongoing.   Problem: Education: Goal: Emotional status will improve Outcome: Progressing   Problem: Coping: Goal: Level of anxiety will decrease Outcome: Not Progressing

## 2022-05-03 NOTE — Group Note (Signed)
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
## 2022-05-04 DIAGNOSIS — F203 Undifferentiated schizophrenia: Secondary | ICD-10-CM | POA: Diagnosis not present

## 2022-05-04 MED ORDER — APIXABAN 5 MG PO TABS: 5.0000 mg | ORAL_TABLET | Freq: Two times a day (BID) | ORAL | 1 refills | Status: DC

## 2022-05-04 MED ORDER — LISINOPRIL 2.5 MG PO TABS: 2.5000 mg | ORAL_TABLET | Freq: Every day | ORAL | 1 refills | Status: DC

## 2022-05-04 MED ORDER — LITHIUM CARBONATE ER 450 MG PO TBCR: 450.0000 mg | EXTENDED_RELEASE_TABLET | Freq: Every day | ORAL | 1 refills | Status: DC

## 2022-05-04 MED ORDER — OLANZAPINE 20 MG PO TABS: 20.0000 mg | ORAL_TABLET | Freq: Every day | ORAL | 1 refills | Status: DC

## 2022-05-04 MED ORDER — METFORMIN HCL 1000 MG PO TABS: 1000.0000 mg | ORAL_TABLET | Freq: Two times a day (BID) | ORAL | 1 refills | Status: DC

## 2022-05-04 MED ORDER — TRAZODONE HCL 100 MG PO TABS: 100.0000 mg | ORAL_TABLET | Freq: Every day | ORAL | 1 refills | Status: DC

## 2022-05-04 MED ORDER — SERTRALINE HCL 100 MG PO TABS: 100.0000 mg | ORAL_TABLET | Freq: Every day | ORAL | 1 refills | Status: DC

## 2022-05-04 MED ORDER — PROPRANOLOL HCL 20 MG PO TABS: 20.0000 mg | ORAL_TABLET | Freq: Every day | ORAL | 1 refills | Status: DC

## 2022-05-04 MED ORDER — IBUPROFEN 600 MG PO TABS: 600.0000 mg | ORAL_TABLET | Freq: Four times a day (QID) | ORAL | 1 refills | Status: DC | PRN

## 2022-05-04 MED ORDER — CLOZAPINE 200 MG PO TABS: 200.0000 mg | ORAL_TABLET | Freq: Every day | ORAL | 1 refills | Status: DC

## 2022-05-04 MED ORDER — ALBUTEROL SULFATE HFA 108 (90 BASE) MCG/ACT IN AERS: 1.0000 | INHALATION_SPRAY | Freq: Four times a day (QID) | RESPIRATORY_TRACT | 1 refills | Status: DC | PRN

## 2022-05-04 MED ORDER — HYDROXYZINE HCL 25 MG PO TABS: 25.0000 mg | ORAL_TABLET | Freq: Three times a day (TID) | ORAL | 2 refills | Status: DC | PRN

## 2022-05-04 MED ORDER — TRAZODONE HCL 100 MG PO TABS
100.0000 mg | ORAL_TABLET | Freq: Every day | ORAL | Status: DC
  Administered 2022-05-04: 100 mg via ORAL
  Filled 2022-05-04: qty 1

## 2022-05-04 MED ORDER — HYDROXYZINE PAMOATE 50 MG PO CAPS: 100.0000 mg | ORAL_CAPSULE | Freq: Every day | ORAL | 1 refills | Status: DC

## 2022-05-04 MED ORDER — SIMVASTATIN 40 MG PO TABS: 40.0000 mg | ORAL_TABLET | Freq: Every day | ORAL | 1 refills | Status: DC

## 2022-05-04 MED ORDER — FLUTICASONE-SALMETEROL 250-50 MCG/ACT IN AEPB: 1.0000 | INHALATION_SPRAY | Freq: Two times a day (BID) | RESPIRATORY_TRACT | 2 refills | Status: DC

## 2022-05-04 MED ORDER — INSULIN DETEMIR 100 UNIT/ML ~~LOC~~ SOLN: 10.0000 [IU] | Freq: Two times a day (BID) | SUBCUTANEOUS | 1 refills | Status: DC

## 2022-05-04 MED ORDER — CLONAZEPAM 0.25 MG PO TBDP: 0.2500 mg | ORAL_TABLET | Freq: Two times a day (BID) | ORAL | 1 refills | Status: DC | PRN

## 2022-05-04 NOTE — Group Note (Signed)
LCSW Group Therapy Note   Group Date: 05/04/2022 Start Time: 1300 End Time: 1400   Type of Therapy and Topic:  Group Therapy: Boundaries  Participation Level:  Minimal  Description of Group: This group will address the use of boundaries in their personal lives. Patients will explore why boundaries are important, the difference between healthy and unhealthy boundaries, and negative and postive outcomes of different boundaries and will look at how boundaries can be crossed.  Patients will be encouraged to identify current boundaries in their own lives and identify what kind of boundary is being set. Facilitators will guide patients in utilizing problem-solving interventions to address and correct types boundaries being used and to address when no boundary is being used. Understanding and applying boundaries will be explored and addressed for obtaining and maintaining a balanced life. Patients will be encouraged to explore ways to assertively make their boundaries and needs known to significant others in their lives, using other group members and facilitator for role play, support, and feedback.  Therapeutic Goals:  1.  Patient will identify areas in their life where setting clear boundaries could be  used to improve their life.  2.  Patient will identify signs/triggers that a boundary is not being respected. 3.  Patient will identify two ways to set boundaries in order to achieve balance in  their lives: 4.  Patient will demonstrate ability to communicate their needs and set boundaries  through discussion and/or role plays  Summary of Patient Progress:   Patient was present for the entirety of group session. Patient participated in opening and closing remarks. However, patient did not contribute at all to the topic of discussion despite encouraged participation. Patient states life would be much more simple if he were a "spirit;" unrelated to topic of discussion.   Therapeutic Modalities:    Cognitive Behavioral Therapy Solution-Focused Therapy  Almedia Balls 05/04/2022  2:57 PM

## 2022-05-04 NOTE — Progress Notes (Signed)
Recreation Therapy Notes    Date: 05/04/2022  Time: 10:40 am    Location: Craft room   Behavioral response: Appropriate   Intervention Topic:  Stress Management     Discussion/Intervention:  Group content on today was focused on stress. The group defined stress and way to cope with stress. Participants expressed how they know when they are stresses out. Individuals described the different ways they have to cope with stress. The group stated reasons why it is important to cope with stress. Patient explained what good stress is and some examples. The group participated in the intervention "Stress Management". Individuals were separated into two group and answered questions related to stress.  Clinical Observations/Feedback: Patient came to group and was focused on what peers and staff had to say about stress management. Individual was social with peers and staff while participating in the intervention.     Jamar Weatherall LRT/CTRS         Carmel Garfield 05/04/2022 1:24 PM

## 2022-05-04 NOTE — BHH Counselor (Signed)
CSW assisted while patient met with the QP from [facility].    Conversation centered on what brought patient into the hospital and patient's plans at discharge.   Meeting was appropriate.  Patient was pleasant and conversational.  Assunta Curtis, MSW, LCSW 05/04/2022 1:45 PM

## 2022-05-04 NOTE — BHH Suicide Risk Assessment (Signed)
[facility] Discharge Suicide Risk Assessment   Principal Problem: Schizophrenia [facility]) Discharge Diagnoses: Principal Problem:   Schizophrenia ([facility]) Active Problems:   Hypertension   Diabetes mellitus without complication ([facility])   Hyperlipidemia   Chronic anticoagulation   Schizoaffective disorder, bipolar type ([facility])   Tobacco use disorder   Boxer's metacarpal fracture, neck, closed   Total Time spent with patient: 30 minutes  Musculoskeletal: Strength & Muscle Tone: within normal limits Gait & Station: normal Patient leans: N/A  Psychiatric Specialty Exam  Presentation  General Appearance:  Bizarre  Eye Contact: Fair  Speech: Clear and Coherent  Speech Volume: Decreased  Handedness: Right   Mood and Affect  Mood: Anxious; Irritable  Duration of Depression Symptoms: Greater than two weeks  Affect: Congruent   Thought Process  Thought Processes: Disorganized  Descriptions of Associations:Intact  Orientation:Full (Time, Place and Person)  Thought Content:Illogical; Paranoid Ideation  History of Schizophrenia/Schizoaffective disorder:Yes  Duration of Psychotic Symptoms:Greater than six months  Hallucinations:No data recorded Ideas of Reference:Paranoia  Suicidal Thoughts:No data recorded Homicidal Thoughts:No data recorded  Sensorium  Memory: Immediate Fair  Judgment: Impaired  Insight: Lacking   Executive Functions  Concentration: Poor  Attention Span: Poor  Recall: Poor  Fund of Knowledge: Fair  Language: Fair   Psychomotor Activity  Psychomotor Activity:No data recorded  Assets  Assets: Communication Skills; Financial Resources/Insurance; Housing   Sleep  Sleep:No data recorded  Physical Exam: Physical Exam Vitals and nursing note reviewed.  Constitutional:      Appearance: Normal appearance.  HENT:     Head: Normocephalic and atraumatic.     Mouth/Throat:     Pharynx: Oropharynx is clear.  Eyes:      Pupils: Pupils are equal, round, and reactive to light.  Cardiovascular:     Rate and Rhythm: Normal rate and regular rhythm.  Pulmonary:     Effort: Pulmonary effort is normal.     Breath sounds: Normal breath sounds.  Abdominal:     General: Abdomen is flat.     Palpations: Abdomen is soft.  Musculoskeletal:        General: Normal range of motion.  Skin:    General: Skin is warm and dry.  Neurological:     General: No focal deficit present.     Mental Status: He is alert. Mental status is at baseline.  Psychiatric:        Mood and Affect: Mood normal.        Thought Content: Thought content normal.    Review of Systems  Constitutional: Negative.   HENT: Negative.    Eyes: Negative.   Respiratory: Negative.    Cardiovascular: Negative.   Gastrointestinal: Negative.   Musculoskeletal: Negative.   Skin: Negative.   Neurological: Negative.   Psychiatric/Behavioral: Negative.     Blood pressure 128/74, pulse 82, temperature 97.9 F (36.6 C), temperature source Oral, resp. rate 16, height 5' 10.5" (1.791 m), weight 73 kg, SpO2 99 %. Body mass index is 22.77 kg/m.  Mental Status Per Nursing Assessment::   On Admission:  NA  Demographic Factors:  Male and Caucasian  Loss Factors: NA  Historical Factors: Impulsivity  Risk Reduction Factors:   Living with another person, especially a relative, Positive social support, and Positive therapeutic relationship  Continued Clinical Symptoms:  Schizophrenia:   Paranoid or undifferentiated type  Cognitive Features That Contribute To Risk:  None    Suicide Risk:  Minimal: No identifiable suicidal ideation.  Patients presenting with no risk factors but with  morbid ruminations; may be classified as minimal risk based on the severity of the depressive symptoms    Plan Of Care/Follow-up recommendations:  Other:  Patient is improved to his baseline.  He denies any suicidal thoughts.  His behavior has been calm and  appropriate.  He is agreeable to the plan for discharge to routine outpatient care tomorrow  Alethia Berthold, MD 05/04/2022, 2:43 PM

## 2022-05-04 NOTE — Progress Notes (Signed)
[facility] MD Progress Note  05/04/2022 2:40 PM Lance [name]  MRN:  [id number] Subjective: Follow-up patient with schizophrenia.  Patient has no new complaints.  He spoke with his ACT team today.  The group home tells him that the patient he had been fighting with is probably going to be leaving soon so that is a relief.  Patient is calm and taking care of all of his ADLs well.  Not agitated and not bizarre in his thinking. Principal Problem: Schizophrenia (Lance [name]) Diagnosis: Principal Problem:   Schizophrenia (Lance [name]) Active Problems:   Hypertension   Diabetes mellitus without complication (Lance [name])   Hyperlipidemia   Chronic anticoagulation   Schizoaffective disorder, bipolar type (Lance [name])   Tobacco use disorder   Boxer's metacarpal fracture, neck, closed  Total Time spent with patient: 30 minutes  Past Psychiatric History: Past history of schizophrenia for a long time chronic multiple hospitalizations usually short stay  Past Medical History:  Past Medical History:  Diagnosis Date   Depression    Diabetes mellitus without complication (Lance [name])    Hyperlipidemia    Hypertension    Lupus anticoagulant disorder (Lance [name]) 06/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   PE (pulmonary thromboembolism) (Lance [name])    Schizo affective schizophrenia (Lance [name])    Supratherapeutic INR 11/19/2016   History reviewed. No pertinent surgical history. Family History:  Family History  Problem Relation Age of Onset   CAD Mother    CAD Sister    Family Psychiatric  History: See previous Social History:  Social History   Substance and Sexual Activity  Alcohol Use Yes   Comment: occassionally     Social History   Substance and Sexual Activity  Drug Use Not Currently   Types: Marijuana   Comment: Last use 11/29/16    Social History   Socioeconomic History   Marital status: Single    Spouse name: Not on file   Number of children: Not on file   Years of education: Not on file   Highest education level: Not  on file  Occupational History   Not on file  Tobacco Use   Smoking status: Every Day    Packs/day: 1.00    Years: 23.00    Total pack years: 23.00    Types: Cigarettes   Smokeless tobacco: Never   Tobacco comments:    will provide material  Substance and Sexual Activity   Alcohol use: Yes    Comment: occassionally   Drug use: Not Currently    Types: Marijuana    Comment: Last use 11/29/16   Sexual activity: Never    Birth control/protection: None    Comment: occasional marijuana- none recently  Other Topics Concern   Not on file  Social History Narrative   From a group home in [place] Determinants of Health   Financial Resource Strain: Low Risk  (03/20/2021)   Overall Financial Resource Strain (CARDIA)    Difficulty of Paying Living Expenses: Not very hard  Food Insecurity: No Food Insecurity (04/27/2022)   Hunger Vital Sign    Worried About Running Out of Food in the Last Year: Never true    Ran Out of Food in the Last Year: Never true  Transportation Needs: No Transportation Needs (04/27/2022)   PRAPARE - Hydrologist (Medical): No    Lack of Transportation (Non-Medical): No  Physical Activity: Insufficiently Active (03/20/2021)   Exercise Vital Sign    Days of Exercise per Week:  3 days    Minutes of Exercise per Session: 20 min  Stress: No Stress Concern Present (03/20/2021)   [employer] of Occupational Health - Occupational Stress Questionnaire    Feeling of Stress : Only a little  Social Connections: Socially Isolated (03/20/2021)   Social Connection and Isolation Panel [NHANES]    Frequency of Communication with Friends and Family: More than three times a week    Frequency of Social Gatherings with Friends and Family: More than three times a week    Attends Religious Services: Never    Database administrator or Organizations: No    Attends Engineer, structural: Never    Marital Status: Never married    Additional Social History:                         Sleep: Fair  Appetite:  Fair  Current Medications: Current Facility-Administered Medications  Medication Dose Route Frequency Provider Last Rate Last Admin   acetaminophen (TYLENOL) tablet 650 mg  650 mg Oral Q6H PRN Jearld Lesch, NP   650 mg at 05/02/22 2133   alum & mag hydroxide-simeth (MAALOX/MYLANTA) 200-200-20 MG/5ML suspension 30 mL  30 mL Oral Q4H PRN Jearld Lesch, NP       apixaban (ELIQUIS) tablet 5 mg  5 mg Oral BID Jakaria Lavergne T, MD   5 mg at 05/04/22 7616   clonazePAM (KLONOPIN) disintegrating tablet 0.25 mg  0.25 mg Oral BID PRN Jailani Hogans, [name], MD       cloZAPine (CLOZARIL) tablet 200 mg  200 mg Oral QHS Mikle Sternberg T, MD   200 mg at 05/03/22 2108   hydrOXYzine (ATARAX) tablet 25 mg  25 mg Oral TID PRN Jearld Lesch, NP       ibuprofen (ADVIL) tablet 600 mg  600 mg Oral Q6H PRN Liani Caris T, MD   600 mg at 05/03/22 2111   insulin detemir (LEVEMIR) injection 10 Units  10 Units Subcutaneous BID Ranette Luckadoo, [name], MD   10 Units at 05/04/22 0737   lisinopril (ZESTRIL) tablet 2.5 mg  2.5 mg Oral Daily Dewarren Ledbetter T, MD   2.5 mg at 05/04/22 1062   lithium carbonate capsule 450 mg  450 mg Oral BID WC Eero Dini T, MD   450 mg at 05/04/22 6948   magnesium hydroxide (MILK OF MAGNESIA) suspension 30 mL  30 mL Oral Daily PRN Jearld Lesch, NP       metFORMIN (GLUCOPHAGE) tablet 1,000 mg  1,000 mg Oral BID WC Jasamine Pottinger, [name], MD   1,000 mg at 05/04/22 5462   multivitamin with minerals tablet 1 tablet  1 tablet Oral Daily Daina Cara, [name], MD   1 tablet at 05/04/22 0823   OLANZapine (ZYPREXA) tablet 20 mg  20 mg Oral QHS Gustavus Haskin T, MD   20 mg at 05/03/22 2109   propranolol (INDERAL) tablet 20 mg  20 mg Oral QHS Kelby Lotspeich T, MD   20 mg at 05/03/22 2108   sertraline (ZOLOFT) tablet 100 mg  100 mg Oral Daily Jamauri Kruzel T, MD   100 mg at 05/04/22 7035   simvastatin (ZOCOR) tablet 40 mg  40 mg  Oral q1800 Antaeus Karel T, MD   40 mg at 05/03/22 1740   traZODone (DESYREL) tablet 100 mg  100 mg Oral QHS PRN Francie Keeling, [name], MD   100 mg at 05/03/22 2113    Lab Results:  Results for orders placed or performed during the hospital encounter of 04/27/22 (from the past 48 hour(s))  Glucose, capillary     Status: Abnormal   Collection Time: 05/03/22  5:43 PM  Result Value Ref Range   Glucose-Capillary 239 (H) 70 - 99 mg/dL    Comment: Glucose reference range applies only to samples taken after fasting for at least 8 hours.    Blood Alcohol level:  Lab Results  Component Value Date   ETH <10 04/26/2022   ETH <10 [id number]    Metabolic Disorder Labs: Lab Results  Component Value Date   HGBA1C 4.9 04/26/2022   MPG 93.93 04/26/2022   MPG 108 11/12/2020   Lab Results  Component Value Date   PROLACTIN 2.0 (L) 08/31/2016   PROLACTIN 4.8 05/22/2016   Lab Results  Component Value Date   CHOL 163 04/28/2022   TRIG 378 (H) 04/28/2022   HDL 35 (L) 04/28/2022   CHOLHDL 4.7 04/28/2022   VLDL 76 (H) 04/28/2022   LDLCALC 52 04/28/2022   LDLCALC UNABLE TO CALCULATE IF TRIGLYCERIDE OVER 400 mg/dL 09/20/2020    Physical Findings: AIMS: Facial and Oral Movements Muscles of Facial Expression: None, normal Lips and Perioral Area: None, normal Jaw: None, normal Tongue: None, normal,Extremity Movements Upper (arms, wrists, hands, fingers): None, normal Lower (legs, knees, ankles, toes): None, normal, Trunk Movements Neck, shoulders, hips: None, normal, Overall Severity Severity of abnormal movements (highest score from questions above): None, normal Incapacitation due to abnormal movements: None, normal Patient's awareness of abnormal movements (rate only patient's report): No Awareness, Dental Status Current problems with teeth and/or dentures?: No Does patient usually wear dentures?: No  CIWA:    COWS:     Musculoskeletal: Strength & Muscle Tone: within normal limits Gait &  Station: normal Patient leans: N/A  Psychiatric Specialty Exam:  Presentation  General Appearance:  Bizarre  Eye Contact: Fair  Speech: Clear and Coherent  Speech Volume: Decreased  Handedness: Right   Mood and Affect  Mood: Anxious; Irritable  Affect: Congruent   Thought Process  Thought Processes: Disorganized  Descriptions of Associations:Intact  Orientation:Full (Time, Place and Person)  Thought Content:Illogical; Paranoid Ideation  History of Schizophrenia/Schizoaffective disorder:Yes  Duration of Psychotic Symptoms:Greater than six months  Hallucinations:No data recorded Ideas of Reference:Paranoia  Suicidal Thoughts:No data recorded Homicidal Thoughts:No data recorded  Sensorium  Memory: Immediate Fair  Judgment: Impaired  Insight: Lacking   Executive Functions  Concentration: Poor  Attention Span: Poor  Recall: Poor  Fund of Knowledge: Fair  Language: Fair   Psychomotor Activity  Psychomotor Activity:No data recorded  Assets  Assets: Communication Skills; Financial Resources/Insurance; Housing   Sleep  Sleep:No data recorded   Physical Exam: Physical Exam Vitals and nursing note reviewed.  Constitutional:      Appearance: Normal appearance.  HENT:     Head: Normocephalic and atraumatic.     Mouth/Throat:     Pharynx: Oropharynx is clear.  Eyes:     Pupils: Pupils are equal, round, and reactive to light.  Cardiovascular:     Rate and Rhythm: Normal rate and regular rhythm.  Pulmonary:     Effort: Pulmonary effort is normal.     Breath sounds: Normal breath sounds.  Abdominal:     General: Abdomen is flat.     Palpations: Abdomen is soft.  Musculoskeletal:        General: Normal range of motion.  Skin:    General: Skin is warm and dry.  Neurological:  General: No focal deficit present.     Mental Status: He is alert. Mental status is at baseline.  Psychiatric:        Attention and Perception:  Attention normal.        Mood and Affect: Mood normal.        Speech: Speech normal.        Behavior: Behavior normal.        Thought Content: Thought content normal.        Cognition and Memory: Cognition normal.        Judgment: Judgment normal.    Review of Systems  Constitutional: Negative.   HENT: Negative.    Eyes: Negative.   Respiratory: Negative.    Cardiovascular: Negative.   Gastrointestinal: Negative.   Musculoskeletal: Negative.   Skin: Negative.   Neurological: Negative.   Psychiatric/Behavioral: Negative.     Blood pressure 128/74, pulse 82, temperature 97.9 F (36.6 C), temperature source Oral, resp. rate 16, height 5' 10.5" (1.791 m), weight 73 kg, SpO2 99 %. Body mass index is 22.77 kg/m.   Treatment Plan Summary: Medication management and Plan no change to medication.  Supportive counseling and review of plan.  Suggest to the patient we discharge him tomorrow so he can get back to the group home before Thanksgiving as he seems to be very stable.  He is agreeable.  Alethia Berthold, MD 05/04/2022, 2:40 PM

## 2022-05-04 NOTE — Plan of Care (Signed)
Patient in & out of his room. Appropriate with staff & peers. Verbalized no issues. Patient denies SI,HI and AVH. Appetite and energy level good. ADLs maintained. Support and encouragement given.

## 2022-05-04 NOTE — BH IP Treatment Plan (Signed)
Interdisciplinary Treatment and Diagnostic Plan Update  05/04/2022 Time of Session: 0830 Lance [name] MRN: [id number]  Principal Diagnosis: Schizophrenia [facility])  Secondary Diagnoses: Principal Problem:   Schizophrenia ([place]) Active Problems:   Hypertension   Diabetes mellitus without complication ([place])   Hyperlipidemia   Chronic anticoagulation   Schizoaffective disorder, bipolar type ([place])   Tobacco use disorder   Boxer's metacarpal fracture, neck, closed   Current Medications:  Current Facility-Administered Medications  Medication Dose Route Frequency Provider Last Rate Last Admin   acetaminophen (TYLENOL) tablet 650 mg  650 mg Oral Q6H PRN Deloria Lair, NP   650 mg at 05/02/22 2133   alum & mag hydroxide-simeth (MAALOX/MYLANTA) 200-200-20 MG/5ML suspension 30 mL  30 mL Oral Q4H PRN Deloria Lair, NP       apixaban (ELIQUIS) tablet 5 mg  5 mg Oral BID Clapacs, [name], MD   5 mg at 05/04/22 5427   clonazePAM (KLONOPIN) disintegrating tablet 0.25 mg  0.25 mg Oral BID PRN Clapacs, Madie Reno, MD       cloZAPine (CLOZARIL) tablet 200 mg  200 mg Oral QHS Clapacs, [name], MD   200 mg at 05/03/22 2108   hydrOXYzine (ATARAX) tablet 25 mg  25 mg Oral TID PRN Deloria Lair, NP       ibuprofen (ADVIL) tablet 600 mg  600 mg Oral Q6H PRN Clapacs, [name], MD   600 mg at 05/03/22 2111   insulin detemir (LEVEMIR) injection 10 Units  10 Units Subcutaneous BID Clapacs, Madie Reno, MD   10 Units at 05/04/22 0821   lisinopril (ZESTRIL) tablet 2.5 mg  2.5 mg Oral Daily Clapacs, [name], MD   2.5 mg at 05/04/22 0623   lithium carbonate capsule 450 mg  450 mg Oral BID WC Clapacs, [name], MD   450 mg at 05/04/22 7628   magnesium hydroxide (MILK OF MAGNESIA) suspension 30 mL  30 mL Oral Daily PRN Deloria Lair, NP       metFORMIN (GLUCOPHAGE) tablet 1,000 mg  1,000 mg Oral BID WC Clapacs, [name], MD   1,000 mg at 05/04/22 3151   multivitamin with minerals tablet 1 tablet  1 tablet Oral Daily  Clapacs, Madie Reno, MD   1 tablet at 05/04/22 0823   OLANZapine (ZYPREXA) tablet 20 mg  20 mg Oral QHS Clapacs, [name], MD   20 mg at 05/03/22 2109   propranolol (INDERAL) tablet 20 mg  20 mg Oral QHS Clapacs, [name], MD   20 mg at 05/03/22 2108   sertraline (ZOLOFT) tablet 100 mg  100 mg Oral Daily Clapacs, [name], MD   100 mg at 05/04/22 7616   simvastatin (ZOCOR) tablet 40 mg  40 mg Oral q1800 Clapacs, [name], MD   40 mg at 05/03/22 1740   traZODone (DESYREL) tablet 100 mg  100 mg Oral QHS PRN Clapacs, [name], MD   100 mg at 05/03/22 2113   PTA Medications: Medications Prior to Admission  Medication Sig Dispense Refill Last Dose   acetaminophen (TYLENOL) 500 MG tablet Take 500 mg by mouth every 4 (four) hours as needed.      acetaminophen (TYLENOL) 650 MG CR tablet Take 650 mg by mouth every 8 (eight) hours as needed for pain.      albuterol (VENTOLIN HFA) 108 (90 Base) MCG/ACT inhaler Inhale 1-2 puffs into the lungs every 6 (six) hours as needed for wheezing or shortness of breath. (Patient taking  differently: Inhale 1 puff into the lungs 2 (two) times daily as needed for wheezing or shortness of breath.) 18 g 1    alum & mag hydroxide-simeth (MAALOX/MYLANTA) 200-200-20 MG/5ML suspension Take 30 mLs by mouth daily as needed for indigestion or heartburn.      ammonium lactate (AMLACTIN) 12 % cream Apply 1 application topically as needed for dry skin.      apixaban (ELIQUIS) 5 MG TABS tablet Take 1 tablet (5 mg total) by mouth 2 (two) times daily. 60 tablet 6    blood glucose meter kit and supplies Dispense based on patient and insurance preference. Use up to four times daily as directed. (FOR ICD-10 E10.9, E11.9). 1 each 6    cloZAPine (CLOZARIL) 100 MG tablet Take 5 tablets (500 mg total) by mouth at bedtime. 150 tablet 1    fluticasone-salmeterol (ADVAIR) 250-50 MCG/ACT AEPB Inhale 1 puff into the lungs 2 (two) times daily.      glucose blood (FORA V30A BLOOD GLUCOSE TEST) test strip CHECK BLOOD  SUGAR TWICE DAILY. 100 strip 3    hydrOXYzine (VISTARIL) 50 MG capsule Take 100 mg by mouth at bedtime.      LEVEMIR FLEXTOUCH 100 UNIT/ML FlexTouch Pen INJECT 10 UNITS INTO THE SKIN TWICE DAILY. 3 mL 11    lisinopril (ZESTRIL) 2.5 MG tablet Take 1 tablet (2.5 mg total) by mouth every morning. 30 tablet 4    lithium carbonate (ESKALITH) 450 MG CR tablet Take 1 tablet (450 mg total) by mouth at bedtime. 30 tablet 1    lithium carbonate (LITHOBID) 300 MG CR tablet Take 1 tablet (300 mg total) by mouth in the morning. 30 tablet 1    magnesium hydroxide (MILK OF MAGNESIA) 400 MG/5ML suspension Take 30 mLs by mouth daily as needed for mild constipation.      metFORMIN (GLUCOPHAGE) 1000 MG tablet TAKE 1 TABLET BY MOUTH TWICE DAILY 56 tablet 10    NOVOFINE AUTOCOVER PEN NEEDLE 30G X 8 MM MISC USE WITH LEVEMIR TWICE DAILY. 100 each 11    OLANZapine (ZYPREXA) 20 MG tablet Take 1 tablet (20 mg total) by mouth at bedtime. 30 tablet 1    Omega-3 Fatty Acids (FISH OIL) 1000 MG CAPS TAKE 1 CAPSULE BY MOUTH ONCE A DAY. 30 capsule 11    propranolol (INDERAL) 20 MG tablet Take 1 tablet (20 mg total) by mouth at bedtime. 30 tablet 1    sertraline (ZOLOFT) 100 MG tablet Take 100 mg by mouth daily.      simvastatin (ZOCOR) 40 MG tablet TAKE 1 TABLET BY MOUTH ONCE DAILY AT 6PM 28 tablet 10    traZODone (DESYREL) 100 MG tablet Take 1 tablet (100 mg total) by mouth at bedtime. 30 tablet 1     Patient Stressors: Medication change or noncompliance    Patient Strengths: Motivation for treatment/growth   Treatment Modalities: Medication Management, Group therapy, Case management,  1 to 1 session with clinician, Psychoeducation, Recreational therapy.   Physician Treatment Plan for Primary Diagnosis: Schizophrenia ([place]) Long Term Goal(s): Improvement in symptoms so as ready for discharge   Short Term Goals: Ability to maintain clinical measurements within normal limits will improve Compliance with prescribed  medications will improve Ability to verbalize feelings will improve Ability to demonstrate self-control will improve Ability to identify and develop effective coping behaviors will improve  Medication Management: Evaluate patient's response, side effects, and tolerance of medication regimen.  Therapeutic Interventions: 1 to 1 sessions, Unit Group sessions and Medication  administration.  Evaluation of Outcomes: Progressing  Physician Treatment Plan for Secondary Diagnosis: Principal Problem:   Schizophrenia ([place]) Active Problems:   Hypertension   Diabetes mellitus without complication ([place])   Hyperlipidemia   Chronic anticoagulation   Schizoaffective disorder, bipolar type ([place])   Tobacco use disorder   Boxer's metacarpal fracture, neck, closed  Long Term Goal(s): Improvement in symptoms so as ready for discharge   Short Term Goals: Ability to maintain clinical measurements within normal limits will improve Compliance with prescribed medications will improve Ability to verbalize feelings will improve Ability to demonstrate self-control will improve Ability to identify and develop effective coping behaviors will improve     Medication Management: Evaluate patient's response, side effects, and tolerance of medication regimen.  Therapeutic Interventions: 1 to 1 sessions, Unit Group sessions and Medication administration.  Evaluation of Outcomes: Progressing   RN Treatment Plan for Primary Diagnosis: Schizophrenia ([place]) Long Term Goal(s): Knowledge of disease and therapeutic regimen to maintain health will improve  Short Term Goals: Ability to remain free from injury will improve, Ability to verbalize frustration and anger appropriately will improve, Ability to demonstrate self-control, Ability to participate in decision making will improve, Ability to verbalize feelings will improve, Ability to disclose and discuss suicidal ideas, Ability to identify and develop effective coping  behaviors will improve, and Compliance with prescribed medications will improve  Medication Management: RN will administer medications as ordered by provider, will assess and evaluate patient's response and provide education to patient for prescribed medication. RN will report any adverse and/or side effects to prescribing provider.  Therapeutic Interventions: 1 on 1 counseling sessions, Psychoeducation, Medication administration, Evaluate responses to treatment, Monitor vital signs and CBGs as ordered, Perform/monitor CIWA, COWS, AIMS and Fall Risk screenings as ordered, Perform wound care treatments as ordered.  Evaluation of Outcomes: Progressing   LCSW Treatment Plan for Primary Diagnosis: Schizophrenia ([place]) Long Term Goal(s): Safe transition to appropriate next level of care at discharge, Engage patient in therapeutic group addressing interpersonal concerns.  Short Term Goals: Engage patient in aftercare planning with referrals and resources, Increase social support, Increase ability to appropriately verbalize feelings, Increase emotional regulation, Facilitate acceptance of mental health diagnosis and concerns, Facilitate patient progression through stages of change regarding substance use diagnoses and concerns, Identify triggers associated with mental health/substance abuse issues, and Increase skills for wellness and recovery  Therapeutic Interventions: Assess for all discharge needs, 1 to 1 time with Social worker, Explore available resources and support systems, Assess for adequacy in community support network, Educate family and significant other(s) on suicide prevention, Complete Psychosocial Assessment, Interpersonal group therapy.  Evaluation of Outcomes: Progressing   Progress in Treatment: Attending groups: Yes. Participating in groups: No. Taking medication as prescribed: Yes. Toleration medication: Yes. Family/Significant other contact made: Yes, individual(s) contacted:   Timoteo Ace, legal guardian, [phone] Patient understands diagnosis: No. Discussing patient identified problems/goals with staff: Yes. Medical problems stabilized or resolved: Yes. Denies suicidal/homicidal ideation: No. Issues/concerns per patient self-inventory: Yes. Other: none  New problem(s) identified: No, Describe:  none  New Short Term/Long Term Goal(s): Patient to work towards elimination of symptoms of psychosis, medication management for mood stabilization; development of comprehensive mental wellness plan.  Patient Goals: No additional goals identified at this time. Patient to continue to work towards original goals identified in initial treatment team meeting. CSW will remain available to patient should they voice additional treatment goals.   Discharge Plan or Barriers: No psychosocial barriers identified at this time, patient to return  to place of residence when appropriate for discharge.   Reason for Continuation of Hospitalization: Medication stabilization Other; describe none  Estimated Length of Stay: 1-7 days   Last 3 [place] Suicide Severity Risk Score: Flowsheet Row Admission (Current) from 04/27/2022 in [facility] ED from 04/26/2022 in [facility] from 11/22/2021 in [place] No Risk No Risk No Risk       Last PHQ 2/9 Scores:    03/20/2021   11:14 AM 03/20/2021   11:04 AM  Depression screen PHQ 2/9  Decreased Interest 0 0  Down, Depressed, Hopeless 0 0  PHQ - 2 Score 0 0    Scribe for Treatment Team: Larose Kells 05/04/2022 8:49 AM

## 2022-05-05 NOTE — [place] FL2 (Signed)
[place] MEDICAID FL2 LEVEL OF CARE SCREENING TOOL     IDENTIFICATION  Patient Name: Lance [name] Birthdate: 06-25-1975 Sex: male Admission Date (Current Location): 04/27/2022  [place] and [place] Number:  [name] [id number] [facility] Facility and Address:  [facility], [address], [place], [place] [address]      Provider Number: [id number]  Attending Physician Name and Address:  Audery Amel, MD  Relative Name and Phone Number:  [name], guardian, [phone]    Current Level of Care: Hospital Recommended Level of Care: Family Care Home, Other (Comment), Assisted Living Facility (Group home) Prior Approval Number:    Date Approved/Denied:   PASRR Number:    Discharge Plan: Other (Comment) (Group home, [facility], Assisted Living Facility)    Current Diagnoses: Patient Active Problem List   Diagnosis Date Noted   Boxer's metacarpal fracture, neck, closed 04/27/2022   Schizophrenia (HCC) 04/27/2022   Blood clotting disorder (HCC) 03/05/2022   Insomnia    Alcohol abuse 04/12/2018   Acute psychosis (HCC) 10/15/2017   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   Tobacco use disorder 09/01/2016   Schizoaffective disorder, bipolar type (HCC) 05/22/2016   Diabetes mellitus without complication (HCC) 05/21/2016   Hyperlipidemia 05/21/2016   Chronic anticoagulation 05/21/2016   Hypertension 09/25/2015    Orientation RESPIRATION BLADDER Height & Weight     Place, Situation, Time, Self  Normal Continent Weight: 73 kg Height:  5' 10.5" (179.1 cm)  BEHAVIORAL SYMPTOMS/MOOD NEUROLOGICAL BOWEL NUTRITION STATUS   (N/A)  (N/A) Continent Diet (Regular)  AMBULATORY STATUS COMMUNICATION OF NEEDS Skin   Independent Verbally Normal                       Personal Care Assistance Level of Assistance   (N/A)           Functional Limitations Info   (N/A)          SPECIAL CARE FACTORS FREQUENCY                        Contractures Contractures Info: Not present    Additional Factors Info  Code Status, Allergies Code Status Info: Full Allergies Info: Penicillins           Current Medications (05/05/2022):  This is the current hospital active medication list Current Facility-Administered Medications  Medication Dose Route Frequency Provider Last Rate Last Admin   acetaminophen (TYLENOL) tablet 650 mg  650 mg Oral Q6H PRN Jearld Lesch, NP   650 mg at 05/02/22 2133   alum & mag hydroxide-simeth (MAALOX/MYLANTA) 200-200-20 MG/5ML suspension 30 mL  30 mL Oral Q4H PRN Jearld Lesch, NP       apixaban (ELIQUIS) tablet 5 mg  5 mg Oral BID Clapacs, [name], MD   5 mg at 05/05/22 0623   clonazePAM (KLONOPIN) disintegrating tablet 0.25 mg  0.25 mg Oral BID PRN Clapacs, [name], MD       cloZAPine (CLOZARIL) tablet 200 mg  200 mg Oral QHS Clapacs, [name], MD   200 mg at 05/04/22 2102   hydrOXYzine (ATARAX) tablet 25 mg  25 mg Oral TID PRN Jearld Lesch, NP       ibuprofen (ADVIL) tablet 600 mg  600 mg Oral Q6H PRN Clapacs, [name], MD   600 mg at 05/03/22 2111   insulin detemir (LEVEMIR) injection 10 Units  10 Units Subcutaneous BID Clapacs, [name], MD   10  Units at 05/05/22 0811   lisinopril (ZESTRIL) tablet 2.5 mg  2.5 mg Oral Daily Clapacs, [name], MD   2.5 mg at 05/05/22 [id number]   lithium carbonate capsule 450 mg  450 mg Oral BID WC Clapacs, [name], MD   450 mg at 05/05/22 [id number]   magnesium hydroxide (MILK OF MAGNESIA) suspension 30 mL  30 mL Oral Daily PRN Deloria Lair, NP       metFORMIN (GLUCOPHAGE) tablet 1,000 mg  1,000 mg Oral BID WC Clapacs, Madie Reno, MD   1,000 mg at 05/05/22 [id number]   multivitamin with minerals tablet 1 tablet  1 tablet Oral Daily Clapacs, Madie Reno, MD   1 tablet at 05/05/22 0811   OLANZapine (ZYPREXA) tablet 20 mg  20 mg Oral QHS Clapacs, [name], MD   20 mg at 05/04/22 2102   propranolol (INDERAL) tablet 20 mg  20 mg Oral QHS Clapacs, [name], MD   20 mg at 05/04/22 2102    sertraline (ZOLOFT) tablet 100 mg  100 mg Oral Daily Clapacs, [name], MD   100 mg at 05/05/22 0811   simvastatin (ZOCOR) tablet 40 mg  40 mg Oral q1800 Clapacs, [name], MD   40 mg at 05/04/22 1645   traZODone (DESYREL) tablet 100 mg  100 mg Oral QHS Clapacs, Madie Reno, MD   100 mg at 05/04/22 2103     Discharge Medications: Please see discharge summary for a list of discharge medications.  Relevant Imaging Results:  Relevant Lab Results:   Additional Information Timoteo Ace, guardian, [phone]  Shirl Harris, LCSW

## 2022-05-05 NOTE — Progress Notes (Signed)
Patient  is pleasant and cooperative.  Has been pacing the hall since the start of the shift. Denies having any psychotic symptoms.  Med compliant and received meds without issue. Reports eating and sleeping well. Ready for discharge. Q15 minute safety checks in place.   C Butler-Nicholson, LPN

## 2022-05-05 NOTE — Progress Notes (Signed)
Patient pleasant and cooperative on approach. Denies SI,HI and AVH. Verbalized understanding discharge instructions and follow up care. All belongings returned to patient from [facility] locker. Patient escorted out by staff and transported by group home staff.

## 2022-05-05 NOTE — Progress Notes (Signed)
Recreation Therapy Notes    Date: 05/05/2022   Time: 10:50 am      Location: Craft room    Behavioral response: N/A   Intervention Topic: Values  Discussion/Intervention: Patient refused to attend group.    Clinical Observations/Feedback:  Patient refused to attend group.    Traye Bates LRT/CTRS        Arthur Aydelotte 05/05/2022 11:28 AM

## 2022-05-05 NOTE — BHH Counselor (Signed)
CSW met with pt briefly to discuss discharge. He stated that he has clothes to wear home. Pt shared that the group home will be coming to pick him up because he has no other way to get back, he has no car. CSW voiced understanding. Pt is a cigarette smoker who declined cessation. When asked about substance use, he stated "Not a whole lot." Pt declined any substance use treatment. Pt denied any other needs. No other concerns expressed. Contact ended without incident.   CSW attempted to contact guardian, Timoteo Ace [phone]). Unable to establish contact but HIPAA compliant voicemail left letting her know about pt being cleared for discharge and contact information to follow up with CSW.   Chalmers Guest. Guerry Bruin, MSW, LCSW, [place] 05/05/2022 9:20 AM

## 2022-05-05 NOTE — Progress Notes (Signed)
  [facility] Adult Case Management Discharge Plan :  Will you be returning to the same living situation after discharge:  Yes,  pt to return to group home upon discharge. At discharge, do you have transportation home?: Yes,  group home to provide transportation. Do you have the ability to pay for your medications: Yes,  Medicare Part A and B.  Release of information consent forms completed and in the chart;  Patient's signature needed at discharge.  Patient to Follow up at:  Follow-up Information     [address] [facility] & [place], [employer]. Follow up.   Why: You ACT team will follow up with you tomorrow, 05/06/22 before 12noon. Thanks! Contact information: [address] [place] [address] [place] [address] [phone]                 Next level of care provider has access to [facility] Link:no  Safety Planning and Suicide Prevention discussed: Yes,  SPE completed with guardian, [name].     Has patient been referred to the Quitline?: Patient refused referral  Patient has been referred for addiction treatment: Pt. refused referral  Glenis Smoker, LCSW 05/05/2022, 10:16 AM

## 2022-05-05 NOTE — Progress Notes (Signed)
Recreation Therapy Notes  INPATIENT RECREATION TR PLAN  Patient Details Name: Lance [name] MRN: [id number] DOB: 1975-07-18 Today's Date: 05/05/2022  Rec Therapy Plan Is patient appropriate for Therapeutic Recreation?: Yes Treatment times per week: at least 3 Estimated Length of Stay: 5-7 days TR Treatment/Interventions: Group participation (Comment)  Discharge Criteria Pt will be discharged from therapy if:: Discharged Treatment plan/goals/alternatives discussed and agreed upon by:: Patient/family  Discharge Summary Short term goals set: Patient will engage in groups without prompting or encouragement from LRT x3 group sessions within 5 recreation therapy group sessions Short term goals met: Adequate for discharge Progress toward goals comments: Groups attended Which groups?: Self-esteem, Stress management Reason goals not met: N/A Therapeutic equipment acquired: N/A Reason patient discharged from therapy: Discharge from hospital Pt/family agrees with progress & goals achieved: Yes Date patient discharged from therapy: 05/05/22   Naavya Postma 05/05/2022, 11:38 AM

## 2022-05-05 NOTE — BHH Suicide Risk Assessment (Signed)
[facility] Discharge Suicide Risk Assessment   Principal Problem: Schizophrenia [facility]) Discharge Diagnoses: Principal Problem:   Schizophrenia (HCC) Active Problems:   Hypertension   Diabetes mellitus without complication (HCC)   Hyperlipidemia   Chronic anticoagulation   Schizoaffective disorder, bipolar type (HCC)   Tobacco use disorder   Boxer's metacarpal fracture, neck, closed   Total Time spent with patient: 30 minutes  Musculoskeletal: Strength & Muscle Tone: within normal limits Gait & Station: normal Patient leans: N/A  Psychiatric Specialty Exam  Presentation  General Appearance:  Bizarre  Eye Contact: Fair  Speech: Clear and Coherent  Speech Volume: Decreased  Handedness: Right   Mood and Affect  Mood: Anxious; Irritable  Duration of Depression Symptoms: Greater than two weeks  Affect: Congruent   Thought Process  Thought Processes: Disorganized  Descriptions of Associations:Intact  Orientation:Full (Time, Place and Person)  Thought Content:Illogical; Paranoid Ideation  History of Schizophrenia/Schizoaffective disorder:Yes  Duration of Psychotic Symptoms:Greater than six months  Hallucinations:No data recorded Ideas of Reference:Paranoia  Suicidal Thoughts:No data recorded Homicidal Thoughts:No data recorded  Sensorium  Memory: Immediate Fair  Judgment: Impaired  Insight: Lacking   Executive Functions  Concentration: Poor  Attention Span: Poor  Recall: Poor  Fund of Knowledge: Fair  Language: Fair   Psychomotor Activity  Psychomotor Activity:No data recorded  Assets  Assets: Communication Skills; Financial Resources/Insurance; Housing   Sleep  Sleep:No data recorded  Physical Exam: Physical Exam Vitals and nursing note reviewed.  Constitutional:      Appearance: Normal appearance.  HENT:     Head: Normocephalic and atraumatic.     Mouth/Throat:     Pharynx: Oropharynx is clear.  Eyes:      Pupils: Pupils are equal, round, and reactive to light.  Cardiovascular:     Rate and Rhythm: Normal rate and regular rhythm.  Pulmonary:     Effort: Pulmonary effort is normal.     Breath sounds: Normal breath sounds.  Abdominal:     General: Abdomen is flat.     Palpations: Abdomen is soft.  Musculoskeletal:        General: Normal range of motion.  Skin:    General: Skin is warm and dry.  Neurological:     General: No focal deficit present.     Mental Status: He is alert. Mental status is at baseline.  Psychiatric:        Attention and Perception: Attention normal.        Mood and Affect: Mood normal.        Speech: Speech normal.        Behavior: Behavior normal.        Thought Content: Thought content normal.        Cognition and Memory: Cognition normal.    Review of Systems  Constitutional: Negative.   HENT: Negative.    Eyes: Negative.   Respiratory: Negative.    Cardiovascular: Negative.   Gastrointestinal: Negative.   Musculoskeletal: Negative.   Skin: Negative.   Neurological: Negative.   Psychiatric/Behavioral: Negative.     Blood pressure 128/74, pulse 82, temperature 97.9 F (36.6 C), temperature source Oral, resp. rate 16, height 5' 10.5" (1.791 m), weight 73 kg, SpO2 99 %. Body mass index is 22.77 kg/m.  Mental Status Per Nursing Assessment::   On Admission:  NA  Demographic Factors:  Male and Caucasian  Loss Factors: NA  Historical Factors: Impulsivity  Risk Reduction Factors:   Living with another person, especially a relative, Positive social support, and  Positive therapeutic relationship  Continued Clinical Symptoms:  Schizophrenia:   Paranoid or undifferentiated type  Cognitive Features That Contribute To Risk:  None    Suicide Risk:  Minimal: No identifiable suicidal ideation.  Patients presenting with no risk factors but with morbid ruminations; may be classified as minimal risk based on the severity of the depressive symptoms    Follow-up Information     [address] [facility] & [place], [employer]. Follow up.   Why: You ACT team will follow up with you tomorrow, 05/06/22. Thanks! Contact information: [address] [place] [address] [phone]                 Plan Of Care/Follow-up recommendations:  Other:  Patient seen for follow-up.  He has not shown any dangerous aggressive violent or suicidal behavior and has consistently denied suicidal or homicidal ideation.  Denies any intention of getting in any fights back at the group home.  Psychotic symptoms seem to be well-controlled and he is tolerating medicine well and taking care of his ADLs well and interacting well with others.  At this point appears to be safe with outpatient follow-up with his ACT team.  Mordecai Rasmussen, MD 05/05/2022, 10:02 AM

## 2022-05-05 NOTE — Plan of Care (Signed)
  Problem: Education: Goal: Knowledge of General Education information will improve Description: Including pain rating scale, medication(s)/side effects and non-pharmacologic comfort measures Outcome: Progressing   Problem: Clinical Measurements: Goal: Ability to maintain clinical measurements within normal limits will improve Outcome: Progressing Goal: Will remain free from infection Outcome: Progressing Goal: Diagnostic test results will improve Outcome: Progressing Goal: Respiratory complications will improve Outcome: Progressing Goal: Cardiovascular complication will be avoided Outcome: Progressing   Problem: Safety: Goal: Periods of time without injury will increase Outcome: Progressing   Problem: Physical Regulation: Goal: Ability to maintain clinical measurements within normal limits will improve Outcome: Progressing   Problem: Health Behavior/Discharge Planning: Goal: Identification of resources available to assist in meeting health care needs will improve Outcome: Progressing Goal: Compliance with treatment plan for underlying cause of condition will improve Outcome: Progressing

## 2022-05-05 NOTE — BHH Counselor (Addendum)
CSW contacted group home owner at [phone]. Owner informed CSW that she was currently at the hospital with another client. CSW inquired regarding transportation back to group home and it was confirmed that they would be providing transportation back. Owner inquired what time would pt be discharging. CSW informed her that he would need to call back with this information. She agreed, stating that she has a few more appointments today so if she did not pick up a message could be left. CSW agreed. No other concerns expressed. Contact ended without incident.   Vilma Meckel. Algis Greenhouse, MSW, LCSW, [name] 05/05/2022 9:57 AM  ADDENDUM  CSW contacted group home owner at [phone]. Owner was informed that pt would be ready for discharge within the next 30-40 minutes. Owner did not confirm time but was informed when she got back to just go to the medical mall desk and let them know she is here to pick him up. She agreed. No other concerns expressed. Contact ended without incident.   Vilma Meckel. Algis Greenhouse, MSW, LCSW, [name] 05/05/2022 10:16 AM
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
## 2022-06-06 ENCOUNTER — Other Ambulatory Visit: Payer: Self-pay

## 2022-06-06 ENCOUNTER — Observation Stay: Payer: Medicare Other

## 2022-06-06 ENCOUNTER — Inpatient Hospital Stay
Admission: EM | Admit: 2022-06-06 | Discharge: 2022-06-09 | DRG: 603 | Disposition: A | Discharge status: Home / Self Care | Payer: Medicare Other | Attending: Internal Medicine | Admitting: Internal Medicine

## 2022-06-06 ENCOUNTER — Emergency Department: Payer: Medicare Other

## 2022-06-06 DIAGNOSIS — D6862 Lupus anticoagulant syndrome: Secondary | ICD-10-CM | POA: Diagnosis present

## 2022-06-06 DIAGNOSIS — Z79899 Other long term (current) drug therapy: Secondary | ICD-10-CM

## 2022-06-06 DIAGNOSIS — Z86711 Personal history of pulmonary embolism: Secondary | ICD-10-CM

## 2022-06-06 DIAGNOSIS — Z7984 Long term (current) use of oral hypoglycemic drugs: Secondary | ICD-10-CM

## 2022-06-06 DIAGNOSIS — Z880 Allergy status to penicillin: Secondary | ICD-10-CM

## 2022-06-06 DIAGNOSIS — Z8249 Family history of ischemic heart disease and other diseases of the circulatory system: Secondary | ICD-10-CM

## 2022-06-06 DIAGNOSIS — M60009 Infective myositis, unspecified site: Principal | ICD-10-CM

## 2022-06-06 DIAGNOSIS — E785 Hyperlipidemia, unspecified: Secondary | ICD-10-CM | POA: Diagnosis present

## 2022-06-06 DIAGNOSIS — Z7901 Long term (current) use of anticoagulants: Secondary | ICD-10-CM

## 2022-06-06 DIAGNOSIS — F259 Schizoaffective disorder, unspecified: Secondary | ICD-10-CM | POA: Diagnosis present

## 2022-06-06 DIAGNOSIS — Z7951 Long term (current) use of inhaled steroids: Secondary | ICD-10-CM

## 2022-06-06 DIAGNOSIS — F17210 Nicotine dependence, cigarettes, uncomplicated: Secondary | ICD-10-CM | POA: Diagnosis present

## 2022-06-06 DIAGNOSIS — Z23 Encounter for immunization: Secondary | ICD-10-CM

## 2022-06-06 DIAGNOSIS — E119 Type 2 diabetes mellitus without complications: Secondary | ICD-10-CM | POA: Diagnosis present

## 2022-06-06 DIAGNOSIS — K029 Dental caries, unspecified: Secondary | ICD-10-CM | POA: Diagnosis present

## 2022-06-06 DIAGNOSIS — F29 Unspecified psychosis not due to a substance or known physiological condition: Secondary | ICD-10-CM | POA: Diagnosis present

## 2022-06-06 DIAGNOSIS — N179 Acute kidney failure, unspecified: Secondary | ICD-10-CM | POA: Diagnosis present

## 2022-06-06 DIAGNOSIS — K047 Periapical abscess without sinus: Secondary | ICD-10-CM | POA: Diagnosis present

## 2022-06-06 DIAGNOSIS — F209 Schizophrenia, unspecified: Secondary | ICD-10-CM | POA: Diagnosis present

## 2022-06-06 DIAGNOSIS — L03211 Cellulitis of face: Principal | ICD-10-CM | POA: Diagnosis present

## 2022-06-06 DIAGNOSIS — I10 Essential (primary) hypertension: Secondary | ICD-10-CM | POA: Diagnosis present

## 2022-06-06 DIAGNOSIS — M272 Inflammatory conditions of jaws: Secondary | ICD-10-CM

## 2022-06-06 LAB — CBC
HCT: 43 % (ref 39.0–52.0)
Hemoglobin: 14.4 g/dL (ref 13.0–17.0)
MCH: 30.7 pg (ref 26.0–34.0)
MCHC: 33.5 g/dL (ref 30.0–36.0)
MCV: 91.7 fL (ref 80.0–100.0)
Platelets: 193 10*3/uL (ref 150–400)
RBC: 4.69 MIL/uL (ref 4.22–5.81)
RDW: 12.7 % (ref 11.5–15.5)
WBC: 9 10*3/uL (ref 4.0–10.5)
nRBC: 0 % (ref 0.0–0.2)

## 2022-06-06 LAB — BASIC METABOLIC PANEL
Anion gap: 8 (ref 5–15)
BUN: 19 mg/dL (ref 6–20)
CO2: 23 mmol/L (ref 22–32)
Calcium: 9.4 mg/dL (ref 8.9–10.3)
Chloride: 104 mmol/L (ref 98–111)
Creatinine, Ser: 1.61 mg/dL — ABNORMAL HIGH (ref 0.61–1.24)
GFR, Estimated: 53 mL/min — ABNORMAL LOW (ref >60)
Glucose, Bld: 174 mg/dL — ABNORMAL HIGH (ref 70–99)
Potassium: 4.3 mmol/L (ref 3.5–5.1)
Sodium: 135 mmol/L (ref 135–145)

## 2022-06-06 LAB — CBG MONITORING, ED
Glucose-Capillary: 282 mg/dL — ABNORMAL HIGH (ref 70–99)
Glucose-Capillary: 136 mg/dL — ABNORMAL HIGH (ref 70–99)

## 2022-06-06 LAB — CK: Total CK: 19 U/L — ABNORMAL LOW (ref 49–397)

## 2022-06-06 MED ORDER — SERTRALINE HCL 50 MG PO TABS
50.0000 mg | ORAL_TABLET | Freq: Every day | ORAL | Status: DC
  Administered 2022-06-07 – 2022-06-09 (×3): 50 mg via ORAL
  Filled 2022-06-06 (×3): qty 1

## 2022-06-06 MED ORDER — CLONAZEPAM 0.25 MG PO TBDP: 0.2500 mg | ORAL_TABLET | Freq: Two times a day (BID) | ORAL | Status: DC | PRN

## 2022-06-06 MED ORDER — SERTRALINE HCL 50 MG PO TABS: 100.0000 mg | ORAL_TABLET | Freq: Every day | ORAL | Status: DC

## 2022-06-06 MED ORDER — ACETAMINOPHEN 650 MG RE SUPP: 650.0000 mg | Freq: Four times a day (QID) | RECTAL | Status: DC | PRN

## 2022-06-06 MED ORDER — SODIUM CHLORIDE 0.9 % IV BOLUS
500.0000 mL | Freq: Once | INTRAVENOUS | Status: AC
  Administered 2022-06-06: 500 mL via INTRAVENOUS

## 2022-06-06 MED ORDER — ONDANSETRON HCL 4 MG PO TABS: 4.0000 mg | ORAL_TABLET | Freq: Four times a day (QID) | ORAL | Status: DC | PRN

## 2022-06-06 MED ORDER — ALBUTEROL SULFATE (2.5 MG/3ML) 0.083% IN NEBU: 3.0000 mL | INHALATION_SOLUTION | Freq: Four times a day (QID) | RESPIRATORY_TRACT | Status: DC | PRN

## 2022-06-06 MED ORDER — VANCOMYCIN HCL 1500 MG/300ML IV SOLN
1500.0000 mg | Freq: Once | INTRAVENOUS | Status: AC
  Administered 2022-06-06: 1500 mg via INTRAVENOUS
  Filled 2022-06-06: qty 300

## 2022-06-06 MED ORDER — LACTATED RINGERS IV SOLN: INTRAVENOUS | Status: AC

## 2022-06-06 MED ORDER — ONDANSETRON HCL 4 MG/2ML IJ SOLN: 4.0000 mg | Freq: Four times a day (QID) | INTRAMUSCULAR | Status: DC | PRN

## 2022-06-06 MED ORDER — ACETAMINOPHEN 325 MG PO TABS: 650.0000 mg | ORAL_TABLET | Freq: Four times a day (QID) | ORAL | Status: DC | PRN

## 2022-06-06 MED ORDER — SIMVASTATIN 20 MG PO TABS
40.0000 mg | ORAL_TABLET | Freq: Every day | ORAL | Status: DC
  Administered 2022-06-06 – 2022-06-08 (×3): 40 mg via ORAL
  Filled 2022-06-06: qty 2
  Filled 2022-06-06: qty 4
  Filled 2022-06-06: qty 2

## 2022-06-06 MED ORDER — PROPRANOLOL HCL 20 MG PO TABS
20.0000 mg | ORAL_TABLET | Freq: Every day | ORAL | Status: DC
  Administered 2022-06-07 – 2022-06-08 (×2): 20 mg via ORAL
  Filled 2022-06-06 (×3): qty 1

## 2022-06-06 MED ORDER — HYDROCODONE-ACETAMINOPHEN 5-325 MG PO TABS
1.0000 | ORAL_TABLET | Freq: Four times a day (QID) | ORAL | Status: DC | PRN
  Administered 2022-06-07 – 2022-06-09 (×5): 1 via ORAL
  Filled 2022-06-06 (×7): qty 1

## 2022-06-06 MED ORDER — MOMETASONE FURO-FORMOTEROL FUM 200-5 MCG/ACT IN AERO
2.0000 | INHALATION_SPRAY | Freq: Two times a day (BID) | RESPIRATORY_TRACT | Status: DC
  Administered 2022-06-07 – 2022-06-08 (×2): 2 via RESPIRATORY_TRACT
  Filled 2022-06-06 (×3): qty 8.8

## 2022-06-06 MED ORDER — APIXABAN 5 MG PO TABS
5.0000 mg | ORAL_TABLET | Freq: Two times a day (BID) | ORAL | Status: DC
  Administered 2022-06-06 – 2022-06-09 (×6): 5 mg via ORAL
  Filled 2022-06-06 (×6): qty 1

## 2022-06-06 MED ORDER — POLYETHYLENE GLYCOL 3350 17 G PO PACK: 17.0000 g | PACK | Freq: Every day | ORAL | Status: DC | PRN

## 2022-06-06 MED ORDER — OLANZAPINE 10 MG PO TABS
20.0000 mg | ORAL_TABLET | Freq: Every day | ORAL | Status: DC
  Administered 2022-06-06 – 2022-06-08 (×3): 20 mg via ORAL
  Filled 2022-06-06: qty 2
  Filled 2022-06-06: qty 4
  Filled 2022-06-06: qty 2
  Filled 2022-06-06: qty 4

## 2022-06-06 MED ORDER — IOHEXOL 300 MG/ML  SOLN
75.0000 mL | Freq: Once | INTRAMUSCULAR | Status: AC | PRN
  Administered 2022-06-06: 75 mL via INTRAVENOUS

## 2022-06-06 MED ORDER — SODIUM CHLORIDE 0.9% FLUSH
3.0000 mL | Freq: Two times a day (BID) | INTRAVENOUS | Status: DC
  Administered 2022-06-06 – 2022-06-09 (×6): 3 mL via INTRAVENOUS

## 2022-06-06 MED ORDER — INSULIN ASPART 100 UNIT/ML IJ SOLN
0.0000 [IU] | Freq: Three times a day (TID) | INTRAMUSCULAR | Status: DC
  Administered 2022-06-06: 8 [IU] via SUBCUTANEOUS
  Administered 2022-06-07 – 2022-06-08 (×2): 3 [IU] via SUBCUTANEOUS
  Administered 2022-06-08 – 2022-06-09 (×4): 2 [IU] via SUBCUTANEOUS
  Filled 2022-06-06 (×7): qty 1

## 2022-06-06 MED ORDER — MOMETASONE FURO-FORMOTEROL FUM 200-5 MCG/ACT IN AERO
2.0000 | INHALATION_SPRAY | Freq: Two times a day (BID) | RESPIRATORY_TRACT | Status: DC
  Filled 2022-06-06: qty 8.8

## 2022-06-06 MED ORDER — LITHIUM CARBONATE ER 450 MG PO TBCR: 450.0000 mg | EXTENDED_RELEASE_TABLET | Freq: Every day | ORAL | Status: DC

## 2022-06-06 MED ORDER — VANCOMYCIN HCL 750 MG/150ML IV SOLN
750.0000 mg | Freq: Two times a day (BID) | INTRAVENOUS | Status: DC
  Administered 2022-06-07 – 2022-06-08 (×3): 750 mg via INTRAVENOUS
  Filled 2022-06-06 (×4): qty 150

## 2022-06-06 MED ORDER — LITHIUM CARBONATE ER 450 MG PO TBCR
450.0000 mg | EXTENDED_RELEASE_TABLET | Freq: Two times a day (BID) | ORAL | Status: DC
  Administered 2022-06-06 – 2022-06-09 (×6): 450 mg via ORAL
  Filled 2022-06-06 (×7): qty 1

## 2022-06-06 MED ORDER — TRAZODONE HCL 100 MG PO TABS
100.0000 mg | ORAL_TABLET | Freq: Every day | ORAL | Status: DC
  Administered 2022-06-06 – 2022-06-08 (×3): 100 mg via ORAL
  Filled 2022-06-06 (×3): qty 1

## 2022-06-06 MED ORDER — CLOZAPINE 100 MG PO TABS: 200.0000 mg | ORAL_TABLET | Freq: Every day | ORAL | Status: DC

## 2022-06-06 MED ORDER — CLOZAPINE 100 MG PO TABS
500.0000 mg | ORAL_TABLET | Freq: Every day | ORAL | Status: DC
  Administered 2022-06-06 – 2022-06-08 (×3): 500 mg via ORAL
  Filled 2022-06-06 (×3): qty 5

## 2022-06-06 MED ORDER — OXYCODONE HCL 5 MG PO TABS
5.0000 mg | ORAL_TABLET | Freq: Once | ORAL | Status: AC
  Administered 2022-06-06: 5 mg via ORAL
  Filled 2022-06-06: qty 1

## 2022-06-06 MED ORDER — SODIUM CHLORIDE 0.9 % IV SOLN
1.0000 g | Freq: Three times a day (TID) | INTRAVENOUS | Status: AC
  Administered 2022-06-06 – 2022-06-09 (×8): 1 g via INTRAVENOUS
  Filled 2022-06-06 (×4): qty 1
  Filled 2022-06-06 (×4): qty 20

## 2022-06-06 MED ORDER — HYDROXYZINE HCL 50 MG PO TABS
100.0000 mg | ORAL_TABLET | Freq: Every day | ORAL | Status: DC
  Administered 2022-06-06 – 2022-06-08 (×3): 100 mg via ORAL
  Filled 2022-06-06 (×4): qty 2

## 2022-06-06 MED ORDER — CLINDAMYCIN PHOSPHATE 600 MG/50ML IV SOLN
600.0000 mg | Freq: Once | INTRAVENOUS | Status: AC
  Administered 2022-06-06: 600 mg via INTRAVENOUS
  Filled 2022-06-06: qty 50

## 2022-06-06 NOTE — Consult Note (Signed)
Pharmacy Antibiotic Note  Lance [name] is a 46 y.o. male admitted on 06/06/2022 with cellulitis.  Pharmacy has been consulted for vancomycin and meropenem dosing.  Plan: Give meropenem 1g IV every 8 hours Give Vancomycin 1500mg  IV x1 followed by 750 IV every 24 hours Goal AUC 400-550  Est AUC: 538.1 Est Cmax: 30.4 Est Cmin: 16.8 Calculated with SCr 1.61   Weight: 72 kg (158 lb 11.7 oz) (per discharge summary on 04/2022)  Temp (24hrs), Avg:98.1 F (36.7 C), Min:98.1 F (36.7 C), Max:98.1 F (36.7 C)  Recent Labs  Lab 06/06/22 1040  WBC 9.0  CREATININE 1.61*    Estimated Creatinine Clearance: 57.8 mL/min (A) (by C-G formula based on SCr of 1.61 mg/dL (H)).    Allergies  Allergen Reactions   Penicillins Other (See Comments)    Reaction: "lockjaw" Has patient had a PCN reaction causing immediate rash, facial/tongue/throat swelling, SOB or lightheadedness with hypotension: No Has patient had a PCN reaction causing severe rash involving mucus membranes or skin necrosis: No Has patient had a PCN reaction that required hospitalization: No Has patient had a PCN reaction occurring within the last 10 years: No If all of the above answers are "NO", then may proceed with Cephalosporin use.     Antimicrobials this admission: 12/23 clindamycin 600 mg IV x1 12/23 Vancomycin >>  12/23 Meropenem >>   Microbiology results: 12/23 BCx: sent  Thank you for allowing pharmacy to be a part of this patient's care.  1/24 06/06/2022 5:07 PM

## 2022-06-06 NOTE — ED Provider Notes (Signed)
[facility] Provider Note    Event Date/Time   First MD Initiated Contact with Patient 06/06/22 1312     (approximate)   History   Facial Swelling   HPI  Lance [name] is a 46 y.o. male with psychiatric history who comes in with concern for right facial swelling.  Patient reports right facial swelling that started 2 days ago.  He reports taking Tylenol without any relief.  He reports being able to tolerate p.o. but decreased ability to open his mouth.  He denies this ever happening previously.  Denies any falls.   Physical Exam   Triage Vital Signs: ED Triage Vitals  Enc Vitals Group     BP 06/06/22 1037 112/72     Pulse Rate 06/06/22 1037 86     Resp 06/06/22 1037 18     Temp 06/06/22 1037 98.1 F (36.7 C)     Temp Source 06/06/22 1037 Oral     SpO2 06/06/22 1037 97 %     Weight --      Height --      Head Circumference --      Peak Flow --      Pain Score 06/06/22 1038 7     Pain Loc --      Pain Edu? --      Excl. in GC? --     Most recent vital signs: Vitals:   06/06/22 1037  BP: 112/72  Pulse: 86  Resp: 18  Temp: 98.1 F (36.7 C)  SpO2: 97%     General: Awake, no distress.  CV:  Good peripheral perfusion.  Resp:  Normal effort.  Abd:  No distention.  Other:  No swelling noted to his right mandible he is able to range his neck but he does have some slight trismus secondary to swelling.  He is got no tenderness in the neck and no tenderness submandibularly   ED Results / Procedures / Treatments   Labs (all labs ordered are listed, but only abnormal results are displayed) Labs Reviewed  BASIC METABOLIC PANEL - Abnormal; Notable for the following components:      Result Value   Glucose, Bld 174 (*)    Creatinine, Ser 1.61 (*)    GFR, Estimated 53 (*)    All other components within normal limits  CBC      RADIOLOGY I have reviewed the CT personally interpreted and patient does not have any  abscess  IMPRESSION: 1. Significant swelling in the right face with subcutaneous fat stranding, scattered free fluid, and asymmetric thickening of the right platysma and masseter musculature most suggestive of cellulitis and infectious/inflammatory myositis. No abscess identified. 2. Multiple dental caries but no clear offending tooth identified.  PROCEDURES:  Critical Care performed: No  Procedures   MEDICATIONS ORDERED IN ED: Medications - No data to display   IMPRESSION / MDM / ASSESSMENT AND PLAN / ED COURSE  I reviewed the triage vital signs and the nursing notes.   Patient's presentation is most consistent with acute presentation with potential threat to life or bodily function.   Patient comes in with facial swelling.  No obvious dental abscess on examination so CT scan was ordered.  He had swelling under the tongue to suggest Ludwig's angina.    IMPRESSION: 1. Significant swelling in the right face with subcutaneous fat stranding, scattered free fluid, and asymmetric thickening of the right platysma and masseter musculature most suggestive of cellulitis and infectious/inflammatory myositis. No  abscess identified. 2. Multiple dental caries but no clear offending tooth identified.  Patient's blood work is reassuring without any fever or white count but the CT scan is concerning I discussed the case with Dr. Willeen Cass from ENT.  This is nothing that would be surgical in nature but recommended admission for IV antibiotics.  Patient allergic to penicillin therefore started on clinda.    FINAL CLINICAL IMPRESSION(S) / ED DIAGNOSES   Final diagnoses:  Infective myositis, unspecified site  Cellulitis of face     Rx / DC Orders   ED Discharge Orders     None        Note:  This document was prepared using Dragon voice recognition software and may include unintentional dictation errors.   Concha Se, MD 06/06/22 [phone]

## 2022-06-06 NOTE — H&P (Signed)
History and Physical    Patient: Lance [name] QIW:[id number] DOB: [birth year] DOA: 06/06/2022 DOS: the patient was seen and examined on 06/06/2022 PCP: Cletis Athens, MD  Patient coming from:  Group home  Chief Complaint:  Chief Complaint  Patient presents with   Facial Swelling   HPI: Lance [name] is a 46 y.o. male with medical history significant of hypertension, hyperlipidemia, lupus anticoagulant disorder c/b PE, schizophrenia, who presents to the ED with complaints of facial swelling.  Mr. Portlock states that approximately 2 to 3 days ago, he noticed that the right side his face and neck began to swell quite quickly.  Since then, swelling has stabilized but has not improved at all.  He endorses significant pain.  He denies any shortness of breath or dysphagia.  He has not had any difficulty with eating or drinking.  He denies any fevers, nausea, vomiting, diarrhea, chest pain, palpitations.  He believes it may be due to his poor dentition.  ED course: On arrival to the ED, patient was afebrile at 98.1 with blood pressure 112/72 with heart rate of 86.  He was saturating at 97% on room air. Initial workup notable for normal WBC at 9.0, and creatinine of 1.61 with GFR 53.  Glucose elevated at 174.  CT imaging was obtained of the soft tissue of the neck with findings remarkable for significant swelling with subcutaneous fat stranding, free fluid and asymmetric thickening of the right platysma and masseter concerning for cellulitis versus myositis.  ENT was contacted by EDP, who did not recommend any surgical intervention, but overnight observation to ensure improvement on IV antibiotics.  TRH contacted for admission.  Review of Systems: As mentioned in the history of present illness. All other systems reviewed and are negative.  Past Medical History:  Diagnosis Date   Depression    Diabetes mellitus without complication ([place])    Hyperlipidemia    Hypertension    Lupus anticoagulant  disorder ([place]) 06/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   PE (pulmonary thromboembolism) ([place])    Schizo affective schizophrenia ([place])    Supratherapeutic INR 11/19/2016   History reviewed. No pertinent surgical history. Social History:  reports that he has been smoking cigarettes. He has a 23.00 pack-year smoking history. He has never used smokeless tobacco. He reports current alcohol use. He reports that he does not currently use drugs after having used the following drugs: Marijuana.  Allergies  Allergen Reactions   Penicillins Other (See Comments)    Reaction: "lockjaw" Has patient had a PCN reaction causing immediate rash, facial/tongue/throat swelling, SOB or lightheadedness with hypotension: No Has patient had a PCN reaction causing severe rash involving mucus membranes or skin necrosis: No Has patient had a PCN reaction that required hospitalization: No Has patient had a PCN reaction occurring within the last 10 years: No If all of the above answers are "NO", then may proceed with Cephalosporin use.     Family History  Problem Relation Age of Onset   CAD Mother    CAD Sister     Prior to Admission medications   Medication Sig Start Date End Date Taking? Authorizing Provider  albuterol (VENTOLIN HFA) 108 (90 Base) MCG/ACT inhaler Inhale 1-2 puffs into the lungs every 6 (six) hours as needed for wheezing or shortness of breath. 05/04/22   Clapacs, Madie Reno, MD  apixaban (ELIQUIS) 5 MG TABS tablet Take 1 tablet (5 mg total) by mouth 2 (two) times daily. 05/04/22  Clapacs, Madie Reno, MD  blood glucose meter kit and supplies Dispense based on patient and insurance preference. Use up to four times daily as directed. (FOR ICD-10 E10.9, E11.9). 07/01/21   Cletis Athens, MD  clonazePAM (KLONOPIN) 0.25 MG disintegrating tablet Take 1 tablet (0.25 mg total) by mouth 2 (two) times daily as needed (anxiety). 05/04/22   Clapacs, Madie Reno, MD  cloZAPine (CLOZARIL) 200 MG tablet Take 1  tablet (200 mg total) by mouth at bedtime. 05/04/22   Clapacs, Madie Reno, MD  fluticasone-salmeterol (ADVAIR) 250-50 MCG/ACT AEPB Inhale 1 puff into the lungs 2 (two) times daily. 05/04/22   Clapacs, Madie Reno, MD  glucose blood (FORA V30A BLOOD GLUCOSE TEST) test strip CHECK BLOOD SUGAR TWICE DAILY. 08/08/21   Cletis Athens, MD  hydrOXYzine (ATARAX) 25 MG tablet Take 1 tablet (25 mg total) by mouth 3 (three) times daily as needed for anxiety. 05/04/22   Clapacs, Madie Reno, MD  hydrOXYzine (VISTARIL) 50 MG capsule Take 2 capsules (100 mg total) by mouth at bedtime. 05/04/22   Clapacs, Madie Reno, MD  ibuprofen (ADVIL) 600 MG tablet Take 1 tablet (600 mg total) by mouth every 6 (six) hours as needed for moderate pain. 05/04/22   Clapacs, Madie Reno, MD  insulin detemir (LEVEMIR) 100 UNIT/ML injection Inject 0.1 mLs (10 Units total) into the skin 2 (two) times daily. 05/04/22   Clapacs, Madie Reno, MD  lisinopril (ZESTRIL) 2.5 MG tablet Take 1 tablet (2.5 mg total) by mouth daily. 05/05/22   Clapacs, Madie Reno, MD  lithium carbonate (ESKALITH) 450 MG CR tablet Take 1 tablet (450 mg total) by mouth at bedtime. 05/04/22   Clapacs, Madie Reno, MD  metFORMIN (GLUCOPHAGE) 1000 MG tablet Take 1 tablet (1,000 mg total) by mouth 2 (two) times daily with a meal. 05/04/22   Clapacs, Madie Reno, MD  NOVOFINE AUTOCOVER PEN NEEDLE 30G X 8 MM MISC USE WITH LEVEMIR TWICE DAILY. 12/24/20   Cletis Athens, MD  OLANZapine (ZYPREXA) 20 MG tablet Take 1 tablet (20 mg total) by mouth at bedtime. 05/04/22   Clapacs, Madie Reno, MD  propranolol (INDERAL) 20 MG tablet Take 1 tablet (20 mg total) by mouth at bedtime. 05/04/22   Clapacs, Madie Reno, MD  sertraline (ZOLOFT) 100 MG tablet Take 1 tablet (100 mg total) by mouth daily. 05/04/22   Clapacs, Madie Reno, MD  simvastatin (ZOCOR) 40 MG tablet Take 1 tablet (40 mg total) by mouth daily at 6 PM. 05/04/22   Clapacs, Madie Reno, MD  traZODone (DESYREL) 100 MG tablet Take 1 tablet (100 mg total) by mouth at bedtime. 05/04/22    Clapacs, Madie Reno, MD    Physical Exam: Vitals:   06/06/22 1037 06/06/22 1330 06/06/22 1500 06/06/22 1600  BP: 112/72  118/69   Pulse: 86 80 88   Resp: 18  16   Temp: 98.1 F (36.7 C)     TempSrc: Oral     SpO2: 97% 96% 96%   Weight:    72 kg   Physical Exam Vitals and nursing note reviewed.  Constitutional:      General: He is not in acute distress.    Appearance: He is normal weight. He is not toxic-appearing.  HENT:     Head: Normocephalic and atraumatic.     Comments: Significant edema and erythema overlying the entire right cheek with extension into the right side of the neck.  Tender to palpation.  No pustules, lacerations, abrasions or rash    Mouth/Throat:  Mouth: Mucous membranes are moist.     Dentition: Abnormal dentition. Dental caries present. No dental tenderness or gum lesions.     Pharynx: Uvula midline. No pharyngeal swelling, posterior oropharyngeal erythema or uvula swelling.     Tonsils: No tonsillar exudate or tonsillar abscesses.  Eyes:     Extraocular Movements: Extraocular movements intact.     Conjunctiva/sclera: Conjunctivae normal.     Pupils: Pupils are equal, round, and reactive to light.  Cardiovascular:     Rate and Rhythm: Normal rate and regular rhythm.     Heart sounds: No murmur heard.    No gallop.  Pulmonary:     Effort: Pulmonary effort is normal. No respiratory distress.     Breath sounds: No stridor. Rales (Bibasilar Rales) present. No wheezing or rhonchi.  Abdominal:     General: Bowel sounds are normal.     Palpations: Abdomen is soft.  Skin:    General: Skin is warm and dry.     Comments: Face and neck abnormalities of the skin as noted above  Neurological:     Mental Status: He is alert and oriented to person, place, and time. Mental status is at baseline.  Psychiatric:        Mood and Affect: Mood normal.        Behavior: Behavior normal.        Thought Content: Thought content normal.        Judgment: Judgment normal.     Data Reviewed: CBC with WBC of 9.0, hemoglobin of 14.4, and platelets of 193. BMP with potassium of 4.3, bicarb of 23, glucose of 174, BUN of 19, creatinine of 1.61 and GFR 53 CK19  CT Soft Tissue Neck W Contrast  Result Date: 06/06/2022 CLINICAL DATA:  Swelling over right mandible. EXAM: CT NECK WITH CONTRAST TECHNIQUE: Multidetector CT imaging of the neck was performed using the standard protocol following the bolus administration of intravenous contrast. RADIATION DOSE REDUCTION: This exam was performed according to the departmental dose-optimization program which includes automated exposure control, adjustment of the mA and/or kV according to patient size and/or use of iterative reconstruction technique. CONTRAST:  30m OMNIPAQUE IOHEXOL 300 MG/ML  SOLN COMPARISON:  None Available. FINDINGS: Pharynx and larynx: The nasal cavity and nasopharynx are unremarkable The oral cavity and oropharynx are unremarkable. The parapharyngeal spaces are clear. The hypopharynx and larynx are unremarkable. The vocal folds are normal in appearance There is no retropharyngeal fluid collection.  The airway is patent. Salivary glands: The parotid and submandibular glands are unremarkable. Thyroid: Unremarkable. Lymph nodes: There is no pathologic lymphadenopathy in the neck. Vascular: The major vasculature of the neck is unremarkable. Limited intracranial: The imaged portions of the intracranial compartment are unremarkable. Visualized orbits: The imaged globes and orbits are unremarkable. Mastoids and visualized paranasal sinuses: Clear. Skeleton: There is no acute osseous abnormality or suspicious osseous lesion. Upper chest: The imaged lung apices are clear. Other: There is significant swelling in the right face with subcutaneous fat stranding, scattered free fluid, and asymmetric thickening of the right platysma and masseter musculature. There is no abscess. There are multiple dental caries. IMPRESSION: 1.  Significant swelling in the right face with subcutaneous fat stranding, scattered free fluid, and asymmetric thickening of the right platysma and masseter musculature most suggestive of cellulitis and infectious/inflammatory myositis. No abscess identified. 2. Multiple dental caries but no clear offending tooth identified. Electronically Signed   By: PValetta MoleM.D.   On: 06/06/2022 14:35  There are no new results to review at this time.  Assessment and Plan: * Facial cellulitis CT imaging with right facial cellulitis involving the masseter with extension into the platysmas.  High risk findings include involvement of the musculature consistent with myositis in addition to scattered free fluid. Patient is able to protect his airway at this time.  He is hemodynamically stable with no evidence of sepsis.  - ID and ENT consulted; appreciate their recommendations - Vancomycin per pharmacy dosing - Meropenem per pharmacy dosing - Blood cultures ordered - Continuous pulse oximetry monitoring given risk for airway compromise - Tylenol as needed for fever - Norco for pain control  AKI (acute kidney injury) ([place]) Today elevated at 1.6, previously 1.37 approximately 6 weeks ago.  Prior to that, creatinine ranged between 0.8 and 1.2.  Uncertain if this is an AKI versus progression of CKD.  - Gentle IV fluids - Renal ultrasound - Repeat BMP in the a.m. - Hold home lisinopril  Diabetes mellitus without complication (HCC) - Hold home medications - SSI, moderate  Hypertension - Hold home lisinopril in the setting of possible AKI  Lupus anticoagulant syndrome ([place]) - Continue home Eliquis  Schizophrenia [facility]) Patient recently admitted for acute psychosis.  No evidence of psychosis on examination today.    - Will continue his current medications without any changes.  Advance Care Planning:   Code Status: Full Code as unable to reach legal guardian.   Consults: ENT and ID  Family  Communication: No family at bedside.  Attempted to contact patient's legal guardian via telephone; no VM left.   Severity of Illness: The appropriate patient status for this patient is OBSERVATION. Observation status is judged to be reasonable and necessary in order to provide the required intensity of service to ensure the patient's safety. The patient's presenting symptoms, physical exam findings, and initial radiographic and laboratory data in the context of their medical condition is felt to place them at decreased risk for further clinical deterioration. Furthermore, it is anticipated that the patient will be medically stable for discharge from the hospital within 2 midnights of admission.   Author: Jose Persia, MD 06/06/2022 5:05 PM  For on call review [email or web address].

## 2022-06-06 NOTE — Assessment & Plan Note (Signed)
-   Hold home medications - SSI, moderate 

## 2022-06-06 NOTE — ED Triage Notes (Addendum)
Pt to ED via POV from home. Pt reports swelling to right side of face that started 2 days ago. Pt unsure if he has infected tooth. Pt has been taking tylenol with no relief.

## 2022-06-06 NOTE — Assessment & Plan Note (Signed)
-   Hold home lisinopril in the setting of possible AKI

## 2022-06-06 NOTE — Assessment & Plan Note (Signed)
Continue home Eliquis. 

## 2022-06-06 NOTE — Consult Note (Addendum)
Pharmacy Consult - Clozapine     46 yo male ordered clozapine 500 mg PO at bedtime  This patient's order has been reviewed for prescribing contraindications.    Clozapine [facility] enrollment Verified: yes on 07/18/2020  REMS patient ID: [id number] Current Outpatient Monitoring: Monthly    Home Regimen: 200 mg PO at bedtime Last dose: 06/05/22   Dose Adjustments This Admission:    Labs: Date    ANC    Submitted? 11/14 3000 yes       Plan: Next ANC due tomorrow morning with AM labs Monitor ANC at least weekly while inpatient

## 2022-06-06 NOTE — ED Notes (Signed)
Report received from Emily, RN

## 2022-06-06 NOTE — ED Notes (Signed)
CBG 282 

## 2022-06-06 NOTE — Assessment & Plan Note (Signed)
Patient recently admitted for acute psychosis.  No evidence of psychosis on examination today.    - Will continue his current medications without any changes.

## 2022-06-06 NOTE — Assessment & Plan Note (Signed)
Today elevated at 1.6, previously 1.37 approximately 6 weeks ago.  Prior to that, creatinine ranged between 0.8 and 1.2.  Uncertain if this is an AKI versus progression of CKD.  - Gentle IV fluids - Renal ultrasound - Repeat BMP in the a.m. - Hold home lisinopril

## 2022-06-06 NOTE — Assessment & Plan Note (Addendum)
CT imaging with right facial cellulitis involving the masseter with extension into the platysmas.  High risk findings include involvement of the musculature consistent with myositis in addition to scattered free fluid. Patient is able to protect his airway at this time.  He is hemodynamically stable with no evidence of sepsis.  - ID and ENT consulted; appreciate their recommendations - Vancomycin per pharmacy dosing - Meropenem per pharmacy dosing - Blood cultures ordered - Continuous pulse oximetry monitoring given risk for airway compromise - Tylenol as needed for fever - Norco for pain control

## 2022-06-06 NOTE — Consult Note (Signed)
Lance [name], Lance [name] [id number] [birth year] [name], MD  Reason for Consult: Facial swelling Requesting Physician: Jose Persia, MD Consulting Physician: [name], MD  HPI: This 46 y.o. year old male was admitted on 06/06/2022 for Facial cellulitis [L03.211]. He has a medical history significant of hypertension, hyperlipidemia, lupus anticoagulant disorder c/b PE, schizophrenia, and presents to the ED with complaints of facial swelling. 2 to 3 days ago, he noticed that the right side his face and neck began to swell quite quickly.  He denies any associated sore throat but had been having pain around his teeth, which are known to be in poor condition, and right ear pain. The pain was worst around the right mandible area. No known pimples or bug bites in the area. He denies any shortness of breath or dysphagia.  He has not had any difficulty with eating or drinking.  He denies any fevers, nausea, vomiting, diarrhea, chest pain, palpitations.   Current Facility-Administered Medications  Medication Dose Route Frequency Provider Last Rate Last Admin   acetaminophen (TYLENOL) tablet 650 mg  650 mg Oral Q6H PRN Jose Persia, MD       Or   acetaminophen (TYLENOL) suppository 650 mg  650 mg Rectal Q6H PRN Jose Persia, MD       albuterol (PROVENTIL) (2.5 MG/3ML) 0.083% nebulizer solution 3 mL  3 mL Nebulization Q6H PRN Jose Persia, MD       apixaban (ELIQUIS) tablet 5 mg  5 mg Oral BID Jose Persia, MD       clonazePAM (KLONOPIN) disintegrating tablet 0.25 mg  0.25 mg Oral BID PRN Jose Persia, MD       cloZAPine (CLOZARIL) tablet 500 mg  500 mg Oral QHS Jose Persia, MD       HYDROcodone-acetaminophen (NORCO/VICODIN) 5-325 MG per tablet 1 tablet  1 tablet Oral Q6H PRN Jose Persia, MD       hydrOXYzine (ATARAX) tablet 100 mg  100 mg Oral QHS Jose Persia, MD       insulin aspart (novoLOG) injection 0-15 Units  0-15 Units Subcutaneous TID WC Jose Persia, MD        lactated ringers infusion   Intravenous Continuous Jose Persia, MD       lithium carbonate (ESKALITH) ER tablet 450 mg  450 mg Oral Q12H Jose Persia, MD       meropenem (MERREM) 1 g in sodium chloride 0.9 % 100 mL IVPB  1 g Intravenous Q8H Darrick Penna, RPH 200 mL/hr at 06/06/22 1749 1 g at 06/06/22 1749   mometasone-formoterol (DULERA) 200-5 MCG/ACT inhaler 2 puff  2 puff Inhalation BID Jose Persia, MD       OLANZapine (ZYPREXA) tablet 20 mg  20 mg Oral QHS Jose Persia, MD       ondansetron (ZOFRAN) tablet 4 mg  4 mg Oral Q6H PRN Jose Persia, MD       Or   ondansetron (ZOFRAN) injection 4 mg  4 mg Intravenous Q6H PRN Jose Persia, MD       polyethylene glycol (MIRALAX / GLYCOLAX) packet 17 g  17 g Oral Daily PRN Jose Persia, MD       propranolol (INDERAL) tablet 20 mg  20 mg Oral QHS Jose Persia, MD       [START ON 06/07/2022] sertraline (ZOLOFT) tablet 50 mg  50 mg Oral Daily Jose Persia, MD       simvastatin (ZOCOR) tablet 40 mg  40 mg Oral q1800 Jose Persia, MD  sodium chloride flush (NS) 0.9 % injection 3 mL  3 mL Intravenous Q12H Jose Persia, MD       traZODone (DESYREL) tablet 100 mg  100 mg Oral QHS Jose Persia, MD       vancomycin (VANCOREADY) IVPB 1500 mg/300 mL  1,500 mg Intravenous Once Darrick Penna, RPH 150 mL/hr at 06/06/22 1747 1,500 mg at 06/06/22 1747   [START ON 06/07/2022] vancomycin (VANCOREADY) IVPB 750 mg/150 mL  750 mg Intravenous Q12H Darrick Penna, [facility]       Current Outpatient Medications  Medication Sig Dispense Refill   apixaban (ELIQUIS) 5 MG TABS tablet Take 1 tablet (5 mg total) by mouth 2 (two) times daily. 60 tablet 1   cloZAPine (CLOZARIL) 200 MG tablet Take 1 tablet (200 mg total) by mouth at bedtime. (Patient taking differently: Take 500 mg by mouth at bedtime.) 30 tablet 1   fluticasone-salmeterol (ADVAIR) 250-50 MCG/ACT AEPB Inhale 1 puff into the lungs 2 (two) times daily. 14 each 2    hydrOXYzine (VISTARIL) 50 MG capsule Take 2 capsules (100 mg total) by mouth at bedtime. 30 capsule 1   insulin detemir (LEVEMIR) 100 UNIT/ML injection Inject 0.1 mLs (10 Units total) into the skin 2 (two) times daily. 10 mL 1   lisinopril (ZESTRIL) 2.5 MG tablet Take 1 tablet (2.5 mg total) by mouth daily. 30 tablet 1   lithium carbonate (ESKALITH) 450 MG CR tablet Take 1 tablet (450 mg total) by mouth at bedtime. (Patient taking differently: Take 450 mg by mouth 2 (two) times daily.) 60 tablet 1   metFORMIN (GLUCOPHAGE) 1000 MG tablet Take 1 tablet (1,000 mg total) by mouth 2 (two) times daily with a meal. 60 tablet 1   Omega-3 Fatty Acids (FISH OIL) 1000 MG CAPS Take 1,000 mg by mouth daily.     sertraline (ZOLOFT) 100 MG tablet Take 1 tablet (100 mg total) by mouth daily. (Patient taking differently: Take 50 mg by mouth daily.) 30 tablet 1   simvastatin (ZOCOR) 40 MG tablet Take 1 tablet (40 mg total) by mouth daily at 6 PM. 30 tablet 1   traZODone (DESYREL) 100 MG tablet Take 1 tablet (100 mg total) by mouth at bedtime. 30 tablet 1   albuterol (VENTOLIN HFA) 108 (90 Base) MCG/ACT inhaler Inhale 1-2 puffs into the lungs every 6 (six) hours as needed for wheezing or shortness of breath. 18 g 1   blood glucose meter kit and supplies Dispense based on patient and insurance preference. Use up to four times daily as directed. (FOR ICD-10 E10.9, E11.9). 1 each 6   clonazePAM (KLONOPIN) 0.25 MG disintegrating tablet Take 1 tablet (0.25 mg total) by mouth 2 (two) times daily as needed (anxiety). 60 tablet 1   glucose blood (FORA V30A BLOOD GLUCOSE TEST) test strip CHECK BLOOD SUGAR TWICE DAILY. 100 strip 3   hydrOXYzine (ATARAX) 25 MG tablet Take 1 tablet (25 mg total) by mouth 3 (three) times daily as needed for anxiety. (Patient not taking: Reported on 06/06/2022) 30 tablet 2   ibuprofen (ADVIL) 600 MG tablet Take 1 tablet (600 mg total) by mouth every 6 (six) hours as needed for moderate pain. 60 tablet  1   NOVOFINE AUTOCOVER PEN NEEDLE 30G X 8 MM MISC USE WITH LEVEMIR TWICE DAILY. 100 each 11   OLANZapine (ZYPREXA) 20 MG tablet Take 1 tablet (20 mg total) by mouth at bedtime. 30 tablet 1   propranolol (INDERAL) 20 MG tablet Take 1 tablet (  20 mg total) by mouth at bedtime. (Patient not taking: Reported on 06/06/2022) 30 tablet 1  . (Not in a hospital admission)   Allergies:  Allergies  Allergen Reactions   Penicillins Other (See Comments)    Reaction: "lockjaw" Has patient had a PCN reaction causing immediate rash, facial/tongue/throat swelling, SOB or lightheadedness with hypotension: No Has patient had a PCN reaction causing severe rash involving mucus membranes or skin necrosis: No Has patient had a PCN reaction that required hospitalization: No Has patient had a PCN reaction occurring within the last 10 years: No If all of the above answers are "NO", then may proceed with Cephalosporin use.     PMH:  Past Medical History:  Diagnosis Date   Depression    Diabetes mellitus without complication ([place])    Hyperlipidemia    Hypertension    Lupus anticoagulant disorder ([place]) 06/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   PE (pulmonary thromboembolism) ([facility])    Schizo affective schizophrenia ([place])    Supratherapeutic INR 11/19/2016    Fam Hx:  Family History  Problem Relation Age of Onset   CAD Mother    CAD Sister     Soc Hx:  Social History   Socioeconomic History   Marital status: Single    Spouse name: Not on file   Number of children: Not on file   Years of education: Not on file   Highest education level: Not on file  Occupational History   Not on file  Tobacco Use   Smoking status: Every Day    Packs/day: 1.00    Years: 23.00    Total pack years: 23.00    Types: Cigarettes   Smokeless tobacco: Never   Tobacco comments:    will provide material  Substance and Sexual Activity   Alcohol use: Yes    Comment: occassionally   Drug use: Not Currently     Types: Marijuana    Comment: Last use 11/29/16   Sexual activity: Never    Birth control/protection: None    Comment: occasional marijuana- none recently  Other Topics Concern   Not on file  Social History Narrative   From a group home in [place] Determinants of Health   Financial Resource Strain: Low Risk  (03/20/2021)   Overall Financial Resource Strain (CARDIA)    Difficulty of Paying Living Expenses: Not very hard  Food Insecurity: No Food Insecurity (04/27/2022)   Hunger Vital Sign    Worried About Running Out of Food in the Last Year: Never true    Ran Out of Food in the Last Year: Never true  Transportation Needs: No Transportation Needs (04/27/2022)   PRAPARE - Hydrologist (Medical): No    Lack of Transportation (Non-Medical): No  Physical Activity: Insufficiently Active (03/20/2021)   Exercise Vital Sign    Days of Exercise per Week: 3 days    Minutes of Exercise per Session: 20 min  Stress: No Stress Concern Present (03/20/2021)   [place]    Feeling of Stress : Only a little  Social Connections: Socially Isolated (03/20/2021)   Social Connection and Isolation Panel [NHANES]    Frequency of Communication with Friends and Family: More than three times a week    Frequency of Social Gatherings with Friends and Family: More than three times a week    Attends Religious Services: Never    Active Member of  Clubs or Organizations: No    Attends Archivist Meetings: Never    Marital Status: Never married  Intimate Partner Violence: Not At Risk (04/27/2022)   Humiliation, Afraid, Rape, and Kick questionnaire    Fear of Current or Ex-Partner: No    Emotionally Abused: No    Physically Abused: No    Sexually Abused: No    PSH: History reviewed. No pertinent surgical history.. Procedures since admission: No admission procedures for hospital encounter.  ROS:  Review of systems normal other than 12 systems except per HPI.  PHYSICAL EXAM  Vitals: Blood pressure 118/69, pulse 88, temperature 98.1 F (36.7 C), temperature source Oral, resp. rate 16, weight 72 kg, SpO2 96 %.. General: Well-developed, Well-nourished in no acute distress Mood: Mood and affect well adjusted, pleasant and cooperative. Orientation: Grossly alert and oriented. Vocal Quality: No hoarseness. Communicates verbally. head and Face: NCAT. No facial asymmetry. No visible skin lesions. Swelling right facial region, most pronounced and tender over the mandible, not no fluctuance or gas.  Ears: External ears with normal landmarks, no lesions. External auditory canals free of infection, cerumen impaction or lesions. Tympanic membranes intact with good landmarks and normal mobility on pneumatic otoscopy. No middle ear effusion. Hearing: Speech reception grossly normal. Nose: External nose normal with midline dorsum and no lesions or deformity. Nasal Cavity reveals essentially midline septum with normal inferior turbinates. No significant mucosal congestion or erythema. Nasal secretions are minimal and clear. No polyps seen on anterior rhinoscopy. Oral Cavity/ Oropharynx: Lips are normal with no lesions. Poor dentition with severla teeth showing decay, including the molars on the right. Gingiva has no lesions or significant swelling . Oropharynx including tongue, buccal mucosa, floor of mouth, hard and soft palate, uvula and posterior pharynx free of exudates, erythema or lesions with normal symmetry and hydration.  Indirect Laryngoscopy/Nasopharyngoscopy: Visualization of the larynx, hypopharynx and nasopharynx is not possible in this setting with routine examination. Neck: Supple and symmetric with no palpable masses, tenderness or crepitance. The trachea is midline. Thyroid gland is soft, nontender and symmetric with no masses or enlargement. Parotid and submandibular glands are soft,  nontender and symmetric, without masses. Lymphatic: Cervical lymph nodes are without palpable lymphadenopathy or tenderness. Respiratory: Normal respiratory effort without labored breathing. Cardiovascular: Carotid pulse shows regular rate and rhythm Neurologic: Cranial Nerves II through XII are grossly intact. Eyes: Gaze and Ocular Motility are grossly normal. PERRLA. No visible nystagmus.  MEDICAL DECISION MAKING: Data Review:  Results for orders placed or performed during the hospital encounter of 06/06/22 (from the past 48 hour(s))  CBC     Status: None   Collection Time: 06/06/22 10:40 AM  Result Value Ref Range   WBC 9.0 4.0 - 10.5 K/uL   RBC 4.69 4.22 - 5.81 MIL/uL   Hemoglobin 14.4 13.0 - 17.0 g/dL   HCT 43.0 39.0 - 52.0 %   MCV 91.7 80.0 - 100.0 fL   MCH 30.7 26.0 - 34.0 pg   MCHC 33.5 30.0 - 36.0 g/dL   RDW 12.7 11.5 - 15.5 %   Platelets 193 150 - 400 K/uL   nRBC 0.0 0.0 - 0.2 %    Comment: Performed at [facility], [address] [place], [place] [address]  Basic metabolic panel     Status: Abnormal   Collection Time: 06/06/22 10:40 AM  Result Value Ref Range   Sodium 135 135 - 145 mmol/L   Potassium 4.3 3.5 - 5.1 mmol/L   Chloride 104  98 - 111 mmol/L   CO2 23 22 - 32 mmol/L   Glucose, Bld 174 (H) 70 - 99 mg/dL    Comment: Glucose reference range applies only to samples taken after fasting for at least 8 hours.   BUN 19 6 - 20 mg/dL   Creatinine, Ser 1.61 (H) 0.61 - 1.24 mg/dL   Calcium 9.4 8.9 - 10.3 mg/dL   GFR, Estimated 53 (L) >60 mL/min    Comment: (NOTE) Calculated using the CKD-EPI Creatinine Equation (2021)    Anion gap 8 5 - 15    Comment: Performed at [facility], [address] [place], [place] [address]  CK     Status: Abnormal   Collection Time: 06/06/22 10:40 AM  Result Value Ref Range   Total CK 19 (L) 49 - 397 U/L    Comment: Performed at [facility], [address], [place], [place] [address]  . CT Soft  Tissue Neck W Contrast  Result Date: 06/06/2022 CLINICAL DATA:  Swelling over right mandible. EXAM: CT NECK WITH CONTRAST TECHNIQUE: Multidetector CT imaging of the neck was performed using the standard protocol following the bolus administration of intravenous contrast. RADIATION DOSE REDUCTION: This exam was performed according to the departmental dose-optimization program which includes automated exposure control, adjustment of the mA and/or kV according to patient size and/or use of iterative reconstruction technique. CONTRAST:  75m OMNIPAQUE IOHEXOL 300 MG/ML  SOLN COMPARISON:  None Available. FINDINGS: Pharynx and larynx: The nasal cavity and nasopharynx are unremarkable The oral cavity and oropharynx are unremarkable. The parapharyngeal spaces are clear. The hypopharynx and larynx are unremarkable. The vocal folds are normal in appearance There is no retropharyngeal fluid collection.  The airway is patent. Salivary glands: The parotid and submandibular glands are unremarkable. Thyroid: Unremarkable. Lymph nodes: There is no pathologic lymphadenopathy in the neck. Vascular: The major vasculature of the neck is unremarkable. Limited intracranial: The imaged portions of the intracranial compartment are unremarkable. Visualized orbits: The imaged globes and orbits are unremarkable. Mastoids and visualized paranasal sinuses: Clear. Skeleton: There is no acute osseous abnormality or suspicious osseous lesion. Upper chest: The imaged lung apices are clear. Other: There is significant swelling in the right face with subcutaneous fat stranding, scattered free fluid, and asymmetric thickening of the right platysma and masseter musculature. There is no abscess. There are multiple dental caries. IMPRESSION: 1. Significant swelling in the right face with subcutaneous fat stranding, scattered free fluid, and asymmetric thickening of the right platysma and masseter musculature most suggestive of cellulitis and  infectious/inflammatory myositis. No abscess identified. 2. Multiple dental caries but no clear offending tooth identified. Electronically Signed   By: PValetta MoleM.D.   On: 06/06/2022 14:35  .   ASSESSMENT: Cellulitis with myositis but no abscess. As I had indicated to the ER and hospitalist, there is nothing surgical that an ENT can offer in these situations where there is no gross drainable abscess. Recommend broad spectrum antibiotic coverage including anaerobe coverage as the most likely source is dental. Optimal management includes dental consultation with removal of the offending teeth as soon as feasible. Consider seeing if the new oral surgeon who has requested privileges is available for consultation.  PLAN: IV antibiotics and observation. Lacking a specific organism, clindamycin 3045mPO QID would be appropriate coverage once discharge. Dental evaluation ASAP. Will sign off. If worsening imaging could be repeated to assess for any soft tissue abscess development, and reconsult ENT. I will not be on call  the remainder of the holidays, so contact the on call physician.    [name], MD 06/06/2022 6:50 PM

## 2022-06-06 NOTE — ED Notes (Signed)
Swelling noted to right side of face

## 2022-06-07 DIAGNOSIS — Z8249 Family history of ischemic heart disease and other diseases of the circulatory system: Secondary | ICD-10-CM | POA: Diagnosis not present

## 2022-06-07 DIAGNOSIS — F259 Schizoaffective disorder, unspecified: Secondary | ICD-10-CM | POA: Diagnosis present

## 2022-06-07 DIAGNOSIS — L03211 Cellulitis of face: Secondary | ICD-10-CM | POA: Diagnosis present

## 2022-06-07 DIAGNOSIS — Z23 Encounter for immunization: Secondary | ICD-10-CM | POA: Diagnosis present

## 2022-06-07 DIAGNOSIS — N179 Acute kidney failure, unspecified: Secondary | ICD-10-CM | POA: Diagnosis present

## 2022-06-07 DIAGNOSIS — K047 Periapical abscess without sinus: Secondary | ICD-10-CM | POA: Diagnosis present

## 2022-06-07 DIAGNOSIS — Z7984 Long term (current) use of oral hypoglycemic drugs: Secondary | ICD-10-CM | POA: Diagnosis not present

## 2022-06-07 DIAGNOSIS — I10 Essential (primary) hypertension: Secondary | ICD-10-CM | POA: Diagnosis present

## 2022-06-07 DIAGNOSIS — D6862 Lupus anticoagulant syndrome: Secondary | ICD-10-CM | POA: Diagnosis present

## 2022-06-07 DIAGNOSIS — Z880 Allergy status to penicillin: Secondary | ICD-10-CM | POA: Diagnosis not present

## 2022-06-07 DIAGNOSIS — Z86711 Personal history of pulmonary embolism: Secondary | ICD-10-CM | POA: Diagnosis not present

## 2022-06-07 DIAGNOSIS — M272 Inflammatory conditions of jaws: Secondary | ICD-10-CM | POA: Diagnosis not present

## 2022-06-07 DIAGNOSIS — E119 Type 2 diabetes mellitus without complications: Secondary | ICD-10-CM | POA: Diagnosis present

## 2022-06-07 DIAGNOSIS — Z7951 Long term (current) use of inhaled steroids: Secondary | ICD-10-CM | POA: Diagnosis not present

## 2022-06-07 DIAGNOSIS — Z7901 Long term (current) use of anticoagulants: Secondary | ICD-10-CM | POA: Diagnosis not present

## 2022-06-07 DIAGNOSIS — F17210 Nicotine dependence, cigarettes, uncomplicated: Secondary | ICD-10-CM | POA: Diagnosis present

## 2022-06-07 DIAGNOSIS — F203 Undifferentiated schizophrenia: Secondary | ICD-10-CM | POA: Diagnosis not present

## 2022-06-07 DIAGNOSIS — Z79899 Other long term (current) drug therapy: Secondary | ICD-10-CM | POA: Diagnosis not present

## 2022-06-07 DIAGNOSIS — E785 Hyperlipidemia, unspecified: Secondary | ICD-10-CM | POA: Diagnosis present

## 2022-06-07 DIAGNOSIS — K029 Dental caries, unspecified: Secondary | ICD-10-CM | POA: Diagnosis present

## 2022-06-07 LAB — BASIC METABOLIC PANEL
Anion gap: 7 (ref 5–15)
BUN: 23 mg/dL — ABNORMAL HIGH (ref 6–20)
CO2: 22 mmol/L (ref 22–32)
Calcium: 8.9 mg/dL (ref 8.9–10.3)
Chloride: 111 mmol/L (ref 98–111)
Creatinine, Ser: 1.3 mg/dL — ABNORMAL HIGH (ref 0.61–1.24)
GFR, Estimated: >60 mL/min (ref >60)
Glucose, Bld: 133 mg/dL — ABNORMAL HIGH (ref 70–99)
Potassium: 4.5 mmol/L (ref 3.5–5.1)
Sodium: 140 mmol/L (ref 135–145)

## 2022-06-07 LAB — CBG MONITORING, ED: Glucose-Capillary: 115 mg/dL — ABNORMAL HIGH (ref 70–99)

## 2022-06-07 LAB — CBC WITH DIFFERENTIAL/PLATELET
Abs Immature Granulocytes: 0.03 10*3/uL (ref 0.00–0.07)
Basophils Absolute: 0 10*3/uL (ref 0.0–0.1)
Basophils Relative: 0 %
Eosinophils Absolute: 0 10*3/uL (ref 0.0–0.5)
Eosinophils Relative: 0 %
HCT: 37.2 % — ABNORMAL LOW (ref 39.0–52.0)
Hemoglobin: 12.8 g/dL — ABNORMAL LOW (ref 13.0–17.0)
Immature Granulocytes: 1 %
Lymphocytes Relative: 13 %
Lymphs Abs: 0.8 10*3/uL (ref 0.7–4.0)
MCH: 31.3 pg (ref 26.0–34.0)
MCHC: 34.4 g/dL (ref 30.0–36.0)
MCV: 91 fL (ref 80.0–100.0)
Monocytes Absolute: 0.7 10*3/uL (ref 0.1–1.0)
Monocytes Relative: 11 %
Neutro Abs: 4.8 10*3/uL (ref 1.7–7.7)
Neutrophils Relative %: 75 %
Platelets: 166 10*3/uL (ref 150–400)
RBC: 4.09 MIL/uL — ABNORMAL LOW (ref 4.22–5.81)
RDW: 12.5 % (ref 11.5–15.5)
WBC: 6.4 10*3/uL (ref 4.0–10.5)
nRBC: 0 % (ref 0.0–0.2)

## 2022-06-07 LAB — GLUCOSE, CAPILLARY
Glucose-Capillary: 180 mg/dL — ABNORMAL HIGH (ref 70–99)
Glucose-Capillary: 143 mg/dL — ABNORMAL HIGH (ref 70–99)

## 2022-06-07 LAB — HIV ANTIBODY (ROUTINE TESTING W REFLEX): HIV Screen 4th Generation wRfx: NONREACTIVE

## 2022-06-07 MED ORDER — INFLUENZA VAC SPLIT QUAD 0.5 ML IM SUSY
0.5000 mL | PREFILLED_SYRINGE | INTRAMUSCULAR | Status: AC
  Administered 2022-06-08: 0.5 mL via INTRAMUSCULAR
  Filled 2022-06-07: qty 0.5

## 2022-06-07 MED ORDER — PNEUMOCOCCAL VAC POLYVALENT 25 MCG/0.5ML IJ INJ
0.5000 mL | INJECTION | INTRAMUSCULAR | Status: AC
  Administered 2022-06-09: 0.5 mL via INTRAMUSCULAR
  Filled 2022-06-07 (×2): qty 0.5

## 2022-06-07 NOTE — Plan of Care (Signed)

## 2022-06-07 NOTE — ED Notes (Signed)
Report given to Leala, RN  

## 2022-06-07 NOTE — Consult Note (Addendum)
NAME: Lance [name]  DOB: 1975-10-28  MRN: [id number]  Date/Time: 06/07/2022 11:15 AM  REQUESTING PROVIDER: Cheral Almas Subjective:  REASON FOR CONSULT: rt odontogenic infection ? Lance [name] is a 46 y.o. with a history of schizophrenia, DM, HTN. PE Presented to the ED on 12/23 with swelling rt side of fce of 2 days duration HE also had severe pain Pt says it started with sensitivity to hot and cold foods on the rt lower teeth a week ago, then he woke up 2 days ago with severe swelling rt side of the face with pain No fever or chills He lives in a group home In the ED vitals  06/06/22  BP 101/62  Temp 98.2 F (36.8 C)  Pulse Rate 72  Resp 16  SpO2 97 %  Weight 158 lb 11.7 oz [1]   Labs  Latest Reference Range & Units 06/06/22  WBC 4.0 - 10.5 K/uL 9.0  Hemoglobin 13.0 - 17.0 g/dL 14.4  HCT 39.0 - 52.0 % 43.0  Platelets 150 - 400 K/uL 193  Creatinine 0.61 - 1.24 mg/dL 1.61 (H)  He says he stopped taking meds for DM - was on insulin    Past Medical History:  Diagnosis Date   Depression    Diabetes mellitus without complication ([place])    Hyperlipidemia    Hypertension    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   PE (pulmonary thromboembolism) ([place])    Schizo affective schizophrenia ([place])    Supratherapeutic INR 11/19/2016    History reviewed. No pertinent surgical history.  Social History   Socioeconomic History   Marital status: Single    Spouse name: Not on file   Number of children: Not on file   Years of education: Not on file   Highest education level: Not on file  Occupational History   Not on file  Tobacco Use   Smoking status: Every Day    Packs/day: 1.00    Years: 23.00    Total pack years: 23.00    Types: Cigarettes   Smokeless tobacco: Never   Tobacco comments:    will provide material  Substance and Sexual Activity   Alcohol use: Yes    Comment: occassionally   Drug use: Not Currently     Types: Marijuana    Comment: Last use 11/29/16   Sexual activity: Never    Birth control/protection: None    Comment: occasional marijuana- none recently  Other Topics Concern   Not on file  Social History Narrative   From a group home in [place] Determinants of Health   Financial Resource Strain: Low Risk  (03/20/2021)   Overall Financial Resource Strain (CARDIA)    Difficulty of Paying Living Expenses: Not very hard  Food Insecurity: No Food Insecurity (04/27/2022)   Hunger Vital Sign    Worried About Running Out of Food in the Last Year: Never true    Ran Out of Food in the Last Year: Never true  Transportation Needs: No Transportation Needs (04/27/2022)   PRAPARE - Hydrologist (Medical): No    Lack of Transportation (Non-Medical): No  Physical Activity: Insufficiently Active (03/20/2021)   Exercise Vital Sign    Days of Exercise per Week: 3 days    Minutes of Exercise per Session: 20 min  Stress: No Stress Concern Present (03/20/2021)   [place]  Feeling of Stress : Only a little  Social Connections: Socially Isolated (03/20/2021)   Social Connection and Isolation Panel [NHANES]    Frequency of Communication with Friends and Family: More than three times a week    Frequency of Social Gatherings with Friends and Family: More than three times a week    Attends Religious Services: Never    Marine scientist or Organizations: No    Attends Archivist Meetings: Never    Marital Status: Never married  Intimate Partner Violence: Not At Risk (04/27/2022)   Humiliation, Afraid, Rape, and Kick questionnaire    Fear of Current or Ex-Partner: No    Emotionally Abused: No    Physically Abused: No    Sexually Abused: No    Family History  Problem Relation Age of Onset   CAD Mother    CAD Sister    Allergies  Allergen Reactions   Penicillins Other (See  Comments)    Reaction: "lockjaw" Has patient had a PCN reaction causing immediate rash, facial/tongue/throat swelling, SOB or lightheadedness with hypotension: No Has patient had a PCN reaction causing severe rash involving mucus membranes or skin necrosis: No Has patient had a PCN reaction that required hospitalization: No Has patient had a PCN reaction occurring within the last 10 years: No If all of the above answers are "NO", then may proceed with Cephalosporin use.    I? Current Facility-Administered Medications  Medication Dose Route Frequency Provider Last Rate Last Admin   acetaminophen (TYLENOL) tablet 650 mg  650 mg Oral Q6H PRN Jose Persia, MD       Or   acetaminophen (TYLENOL) suppository 650 mg  650 mg Rectal Q6H PRN Jose Persia, MD       albuterol (PROVENTIL) (2.5 MG/3ML) 0.083% nebulizer solution 3 mL  3 mL Nebulization Q6H PRN Jose Persia, MD       apixaban (ELIQUIS) tablet 5 mg  5 mg Oral BID Jose Persia, MD   5 mg at 06/07/22 1041   clonazePAM (KLONOPIN) disintegrating tablet 0.25 mg  0.25 mg Oral BID PRN Jose Persia, MD       cloZAPine (CLOZARIL) tablet 500 mg  500 mg Oral QHS Jose Persia, MD   500 mg at 06/06/22 2233   HYDROcodone-acetaminophen (NORCO/VICODIN) 5-325 MG per tablet 1 tablet  1 tablet Oral Q6H PRN Jose Persia, MD   1 tablet at 06/07/22 1044   hydrOXYzine (ATARAX) tablet 100 mg  100 mg Oral QHS Jose Persia, MD   100 mg at 06/06/22 2233   insulin aspart (novoLOG) injection 0-15 Units  0-15 Units Subcutaneous TID WC Jose Persia, MD   8 Units at 06/06/22 1925   lithium carbonate (ESKALITH) ER tablet 450 mg  450 mg Oral Q12H Jose Persia, MD   450 mg at 06/07/22 1041   meropenem (MERREM) 1 g in sodium chloride 0.9 % 100 mL IVPB  1 g Intravenous Q8H Darrick Penna, [facility]   Stopped at 06/07/22 0232   mometasone-formoterol (DULERA) 200-5 MCG/ACT inhaler 2 puff  2 puff Inhalation BID Jose Persia, MD       OLANZapine  (ZYPREXA) tablet 20 mg  20 mg Oral QHS Jose Persia, MD   20 mg at 06/06/22 2233   ondansetron (ZOFRAN) tablet 4 mg  4 mg Oral Q6H PRN Jose Persia, MD       Or   ondansetron (ZOFRAN) injection 4 mg  4 mg Intravenous Q6H PRN Jose Persia, MD  polyethylene glycol (MIRALAX / GLYCOLAX) packet 17 g  17 g Oral Daily PRN Basaraba, Iulia, MD       propranolol (INDERAL) tablet 20 mg  20 mg Oral QHS Basaraba, Iulia, MD       sertraline (ZOLOFT) tablet 50 mg  50 mg Oral Daily Basaraba, Iulia, MD   50 mg at 06/07/22 1041   simvastatin (ZOCOR) tablet 40 mg  40 mg Oral q1800 Basaraba, Iulia, MD   40 mg at 06/06/22 1925   sodium chloride flush (NS) 0.9 % injection 3 mL  3 mL Intravenous Q12H Basaraba, Iulia, MD   3 mL at 06/06/22 2218   traZODone (DESYREL) tablet 100 mg  100 mg Oral QHS Basaraba, Iulia, MD   100 mg at 06/06/22 2233   vancomycin (VANCOREADY) IVPB 750 mg/150 mL  750 mg Intravenous Q12H Moore, Anderson S, RPH 150 mL/hr at 06/07/22 0624 750 mg at 06/07/22 0624   Current Outpatient Medications  Medication Sig Dispense Refill   apixaban (ELIQUIS) 5 MG TABS tablet Take 1 tablet (5 mg total) by mouth 2 (two) times daily. 60 tablet 1   cloZAPine (CLOZARIL) 200 MG tablet Take 1 tablet (200 mg total) by mouth at bedtime. (Patient taking differently: Take 500 mg by mouth at bedtime.) 30 tablet 1   fluticasone-salmeterol (ADVAIR) 250-50 MCG/ACT AEPB Inhale 1 puff into the lungs 2 (two) times daily. 14 each 2   hydrOXYzine (VISTARIL) 50 MG capsule Take 2 capsules (100 mg total) by mouth at bedtime. 30 capsule 1   insulin detemir (LEVEMIR) 100 UNIT/ML injection Inject 0.1 mLs (10 Units total) into the skin 2 (two) times daily. 10 mL 1   lisinopril (ZESTRIL) 2.5 MG tablet Take 1 tablet (2.5 mg total) by mouth daily. 30 tablet 1   lithium carbonate (ESKALITH) 450 MG CR tablet Take 1 tablet (450 mg total) by mouth at bedtime. (Patient taking differently: Take 450 mg by mouth 2 (two) times daily.)  60 tablet 1   metFORMIN (GLUCOPHAGE) 1000 MG tablet Take 1 tablet (1,000 mg total) by mouth 2 (two) times daily with a meal. 60 tablet 1   Omega-3 Fatty Acids (FISH OIL) 1000 MG CAPS Take 1,000 mg by mouth daily.     sertraline (ZOLOFT) 100 MG tablet Take 1 tablet (100 mg total) by mouth daily. (Patient taking differently: Take 50 mg by mouth daily.) 30 tablet 1   simvastatin (ZOCOR) 40 MG tablet Take 1 tablet (40 mg total) by mouth daily at 6 PM. 30 tablet 1   traZODone (DESYREL) 100 MG tablet Take 1 tablet (100 mg total) by mouth at bedtime. 30 tablet 1   albuterol (VENTOLIN HFA) 108 (90 Base) MCG/ACT inhaler Inhale 1-2 puffs into the lungs every 6 (six) hours as needed for wheezing or shortness of breath. 18 g 1   blood glucose meter kit and supplies Dispense based on patient and insurance preference. Use up to four times daily as directed. (FOR ICD-10 E10.9, E11.9). 1 each 6   clonazePAM (KLONOPIN) 0.25 MG disintegrating tablet Take 1 tablet (0.25 mg total) by mouth 2 (two) times daily as needed (anxiety). 60 tablet 1   glucose blood (FORA V30A BLOOD GLUCOSE TEST) test strip CHECK BLOOD SUGAR TWICE DAILY. 100 strip 3   hydrOXYzine (ATARAX) 25 MG tablet Take 1 tablet (25 mg total) by mouth 3 (three) times daily as needed for anxiety. (Patient not taking: Reported on 06/06/2022) 30 tablet 2   ibuprofen (ADVIL) 600 MG tablet Take 1   tablet (600 mg total) by mouth every 6 (six) hours as needed for moderate pain. 60 tablet 1   NOVOFINE AUTOCOVER PEN NEEDLE 30G X 8 MM MISC USE WITH LEVEMIR TWICE DAILY. 100 each 11   OLANZapine (ZYPREXA) 20 MG tablet Take 1 tablet (20 mg total) by mouth at bedtime. 30 tablet 1   propranolol (INDERAL) 20 MG tablet Take 1 tablet (20 mg total) by mouth at bedtime. (Patient not taking: Reported on 06/06/2022) 30 tablet 1     Abtx:  Anti-infectives (From admission, onward)    Start     Dose/Rate Route Frequency Ordered Stop   06/07/22 0600  vancomycin (VANCOREADY) IVPB  750 mg/150 mL        750 mg 150 mL/hr over 60 Minutes Intravenous Every 12 hours 06/06/22 1719     06/06/22 1730  vancomycin (VANCOREADY) IVPB 1500 mg/300 mL        1,500 mg 150 mL/hr over 120 Minutes Intravenous  Once 06/06/22 1709 06/06/22 1955   06/06/22 1730  meropenem (MERREM) 1 g in sodium chloride 0.9 % 100 mL IVPB        1 g 200 mL/hr over 30 Minutes Intravenous Every 8 hours 06/06/22 1709     06/06/22 1345  clindamycin (CLEOCIN) IVPB 600 mg        600 mg 100 mL/hr over 30 Minutes Intravenous  Once 06/06/22 1330 06/06/22 1526       REVIEW OF SYSTEMS:  Const: negative fever, negative chills, negative weight loss Eyes: negative diplopia or visual changes, negative eye pain ENT: negative coryza, negative sore throat Resp: negative cough, hemoptysis, dyspnea Cards: negative for chest pain, palpitations, lower extremity edema GU: negative for frequency, dysuria and hematuria GI: Negative for abdominal pain, diarrhea, bleeding, constipation Skin: negative for rash and pruritus Heme: negative for easy bruising and gum/nose bleeding MS: negative for myalgias, arthralgias, back pain and muscle weakness Neurolo:negative for headaches, dizziness, vertigo, memory problems  Psych:anxiety, depression  Endocrine:  diabetes Allergy/Immunology- penicllin - locked his jaw as a teen Objective:  VITALS:  BP 115/72   Pulse 82   Temp 97.9 F (36.6 C) (Oral)   Resp (!) 0   Wt 72 kg Comment: per discharge summary on 04/2022  SpO2 100%   BMI 22.45 kg/m   PHYSICAL EXAM:  General: Alert, cooperative, no distress, appears stated age.  Head: Normocephalic, without obvious abnormality, atraumatic. Eyes: Conjunctivae clear, anicteric sclerae. Pupils are equal Swelling of rt side of face and some erythema rt side of neck No difficulty in swallowing No drooling of saliva Poor dentition Back: No CVA tenderness. Lungs: Clear to auscultation bilaterally. No Wheezing or Rhonchi. No  rales. Heart: Regular rate and rhythm, no murmur, rub or gallop. Abdomen: Soft, non-tender,not distended. Bowel sounds normal. No masses Extremities: atraumatic, no cyanosis. No edema. No clubbing Skin: No rashes or lesions. Or bruising Lymph: Cervical, supraclavicular normal. Neurologic: Grossly non-focal Pertinent Labs Lab Results CBC    Component Value Date/Time   WBC 6.4 06/07/2022 0418   RBC 4.09 (L) 06/07/2022 0418   HGB 12.8 (L) 06/07/2022 0418   HGB 15.7 03/03/2014 1215   HCT 37.2 (L) 06/07/2022 0418   HCT 47.5 03/03/2014 1215   PLT 166 06/07/2022 0418   PLT 222 03/03/2014 1215   MCV 91.0 06/07/2022 0418   MCV 93 03/03/2014 1215   MCH 31.3 06/07/2022 0418   MCHC 34.4 06/07/2022 0418   RDW 12.5 06/07/2022 0418   RDW 13.2 03/03/2014 1215  LYMPHSABS 0.8 06/07/2022 0418   LYMPHSABS 1.4 04/02/2013 0408   MONOABS 0.7 06/07/2022 0418   MONOABS 0.9 04/02/2013 0408   EOSABS 0.0 06/07/2022 0418   EOSABS 0.0 04/02/2013 0408   BASOSABS 0.0 06/07/2022 0418   BASOSABS 0.0 04/02/2013 0408       Latest Ref Rng & Units 06/07/2022    4:18 AM 06/06/2022   10:40 AM 04/26/2022    7:46 PM  CMP  Glucose 70 - 99 mg/dL 133  174  109   BUN 6 - 20 mg/dL 23  19  22   Creatinine 0.61 - 1.24 mg/dL 1.30  1.61  1.37   Sodium 135 - 145 mmol/L 140  135  143   Potassium 3.5 - 5.1 mmol/L 4.5  4.3  4.7   Chloride 98 - 111 mmol/L 111  104  114   CO2 22 - 32 mmol/L 22  23  23   Calcium 8.9 - 10.3 mg/dL 8.9  9.4  9.5   Total Protein 6.5 - 8.1 g/dL   7.1   Total Bilirubin 0.3 - 1.2 mg/dL   0.6   Alkaline Phos 38 - 126 U/L   48   AST 15 - 41 U/L   18   ALT 0 - 44 U/L   12       Microbiology: Recent Results (from the past 240 hour(s))  Culture, blood (Routine X 2) w Reflex to ID Panel     Status: None (Preliminary result)   Collection Time: 06/06/22  5:27 PM   Specimen: BLOOD  Result Value Ref Range Status   Specimen Description BLOOD BLOOD LEFT ARM  Final   Special Requests   Final     BOTTLES DRAWN AEROBIC AND ANAEROBIC Blood Culture adequate volume   Culture   Final    NO GROWTH < 24 HOURS Performed at [facility], [address] [place], [place] [address]    Report Status PENDING  Incomplete  Culture, blood (Routine X 2) w Reflex to ID Panel     Status: None (Preliminary result)   Collection Time: 06/06/22  5:29 PM   Specimen: BLOOD  Result Value Ref Range Status   Specimen Description BLOOD BLOOD LEFT ARM  Final   Special Requests   Final    BOTTLES DRAWN AEROBIC AND ANAEROBIC Blood Culture adequate volume   Culture   Final    NO GROWTH < 24 HOURS Performed at [facility], [address] [place],  [address]    Report Status PENDING  Incomplete    IMAGING RESULTS: Significant swelling rt side of face  with [place] fat stranding, scattered free fluyis and thickening of rt platysma /masseter muscualture I have personally reviewed the films ? Impression/Recommendation ? ?Odontogenic infectrion of the rt side with surrounding swelling/ cellulitis of soft tissue of face Dental caries Common organisms are oral strep and anerobes Currently on meropenem and vancomycin Can DC vanco There is a mention of PCN allergy which he says was locking of his jaw Dount this is a true allergy Will change meropenem to unasyn starting tomorrow So that on discharge it would be easy to do augmentin ?  Schizophrenia- on multiple medication  HN DM ___________________________________________________ Discussed with patient, and Dr.Zhang Note:  This document was prepared using Dragon voice recognition software and may include unintentional dictation errors. 

## 2022-06-07 NOTE — ED Notes (Signed)
Advised nurse that patient has ready bed 

## 2022-06-07 NOTE — Progress Notes (Signed)
  Progress Note   Patient: Lance [name] MWN:[id number] DOB: 01/12/1976 DOA: 06/06/2022     0 DOS: the patient was seen and examined on 06/07/2022   Brief hospital course: Lance [name] is a 46 y.o. male with medical history significant of hypertension, hyperlipidemia, lupus anticoagulant disorder c/b PE, schizophrenia, who presents to the ED with complaints of facial swelling.  Patient was diagnosed with facial cellulitis, was started on vancomycin and meropenem.  ID on board.  Assessment and Plan: Facial cellulitis. Appears secondary to dental infection. Patient has been seen by ID and ENT, currently on vancomycin and meropenem.  Blood cultures so far has no growth. Patient condition appears to be improving.  Continue current antibiotics.  Patient most likely will be discharged on Tuesday clindamycin and Keflex.  Acute kidney injury. Renal function has improved.  Type 2 diabetes. Continue sliding scale insulin.  Essential hypertension Lisinopril on hold due to AKI.  Lupus anticoagulant syndrome. Continue Eliquis.  Schizophrenia. Stable.       Subjective:  Patient has still has some right facial swelling, much improved compared to yesterday.  Physical Exam: Vitals:   06/07/22 1000 06/07/22 1105 06/07/22 1156 06/07/22 1451  BP: 115/72  111/65 121/68  Pulse: 73 82 76 80  Resp:   20 17  Temp:  97.9 F (36.6 C) 98.2 F (36.8 C) (!) 97.3 F (36.3 C)  TempSrc:  Oral    SpO2: 97% 100% 98% 98%  Weight:       General exam: Appears calm and comfortable  Respiratory system: Clear to auscultation. Respiratory effort normal. Cardiovascular system: S1 & S2 heard, RRR. No JVD, murmurs, rubs, gallops or clicks. No pedal edema. Gastrointestinal system: Abdomen is nondistended, soft and nontender. No organomegaly or masses felt. Normal bowel sounds heard. Central nervous system: Alert and oriented. No focal neurological deficits. Extremities: Symmetric 5 x 5 power. Skin:  No rashes, lesions or ulcers Psychiatry: Judgement and insight appear normal. Mood & affect appropriate.   Data Reviewed:  Lab results reviewed.  Family Communication: None  Disposition: Status is: Observation   Planned Discharge Destination:  Group home    Time spent: 35 minutes  Author: Marrion Coy, MD 06/07/2022 3:06 PM  For on call review [email or web address].

## 2022-06-07 NOTE — Hospital Course (Signed)
Lance [name] is a 46 y.o. male with medical history significant of hypertension, hyperlipidemia, lupus anticoagulant disorder c/b PE, schizophrenia, who presents to the ED with complaints of facial swelling.  Patient was diagnosed with facial cellulitis, was started on vancomycin and meropenem.  ID on board.

## 2022-06-07 NOTE — ED Notes (Signed)
Patient got out of bed and ran out the door to the bathroom. Patient ripped both IV cannulas out of arms. Patient went to bathroom and sat on toilet with clothes on and urinated on self and onto the bathroom floor. Patient was awake but very drowsy. Patient oriented to person, place. Patient was assisted with undressing due to soiled clothing, patient did have some difficulties following commands during undressing. Patient was placed in hospital gown and assisted back to bed. Manuela Schwartz, NP notified and no new orders received.

## 2022-06-07 NOTE — ED Notes (Signed)
Patient got out of bed and was trying to go out the door with monitors and IV fluids still attached. Patient states he had to pee right now. Was unhooking patient's IV when he was trying to push pass me.Verbalized to patient that he needed to wait until I unhooked him or until I grabbed him a urinal. Patient did not listen and as I unhooked his IV from fluids patient pushed pass me still connected to monitors and then peed on the floor in the room and  in front of doorway. Patient then got back into bed and went back to sleep in urine soaked gown. I then changed patient's gown and cleaned up the urine from floor. Attempted calling housekeeping for mopping. No answer. Notified patent's nurse.

## 2022-06-08 DIAGNOSIS — N179 Acute kidney failure, unspecified: Secondary | ICD-10-CM | POA: Diagnosis not present

## 2022-06-08 DIAGNOSIS — D6862 Lupus anticoagulant syndrome: Secondary | ICD-10-CM | POA: Diagnosis not present

## 2022-06-08 DIAGNOSIS — L03211 Cellulitis of face: Secondary | ICD-10-CM | POA: Diagnosis not present

## 2022-06-08 DIAGNOSIS — F203 Undifferentiated schizophrenia: Secondary | ICD-10-CM | POA: Diagnosis not present

## 2022-06-08 LAB — GLUCOSE, CAPILLARY
Glucose-Capillary: 148 mg/dL — ABNORMAL HIGH (ref 70–99)
Glucose-Capillary: 142 mg/dL — ABNORMAL HIGH (ref 70–99)
Glucose-Capillary: 185 mg/dL — ABNORMAL HIGH (ref 70–99)
Glucose-Capillary: 172 mg/dL — ABNORMAL HIGH (ref 70–99)

## 2022-06-08 MED ORDER — VANCOMYCIN HCL 1750 MG/350ML IV SOLN: 1750.0000 mg | INTRAVENOUS | Status: DC

## 2022-06-08 MED ORDER — SODIUM CHLORIDE 0.9 % IV SOLN
3.0000 g | Freq: Four times a day (QID) | INTRAVENOUS | Status: DC
  Administered 2022-06-09 (×2): 3 g via INTRAVENOUS
  Filled 2022-06-08: qty 8
  Filled 2022-06-08: qty 3
  Filled 2022-06-08: qty 8

## 2022-06-08 MED ORDER — AMOXICILLIN NICU ORAL SYRINGE 250 MG/5 ML
250.0000 mg | Freq: Once | ORAL | Status: AC
  Administered 2022-06-08: 250 mg via ORAL
  Filled 2022-06-08 (×2): qty 5

## 2022-06-08 NOTE — Consult Note (Signed)
PHARMACIST - PHYSICIAN ORDER COMMUNICATION  Lance [name] is a 46 y.o. year old male with a history of schizophrenia on Clozapine PTA. Continuing this medication order as an inpatient requires that monitoring parameters per REMS requirements must be met.   Clozapine REMS Dispense Authorization was obtained, and will dispense inpatient.  RDA code [id number].  Verified Clozapine dose: 532m nightly  Last ANC value and date reported on the Clozapine REMS website: 4800 uL on 06/07/22 [place] Next [place] is due on (date) 06/14/2022.  Lance [name] A Lance [name] 06/08/2022, 11:37 AM

## 2022-06-08 NOTE — Progress Notes (Signed)
  Progress Note   Patient: Lance [name] YWV:[id number] DOB: 06-26-1975 DOA: 06/06/2022     1 DOS: the patient was seen and examined on 06/08/2022   Brief hospital course: Lance [name] is a 46 y.o. male with medical history significant of hypertension, hyperlipidemia, lupus anticoagulant disorder c/b PE, schizophrenia, who presents to the ED with complaints of facial swelling.  Patient was diagnosed with facial cellulitis, was started on vancomycin and meropenem.  ID on board.  Assessment and Plan: Facial cellulitis. Appears secondary to dental infection. Patient has been seen by ID and ENT, currently on vancomycin and meropenem.  Blood cultures so far has no growth x 2days Patient continues to make progress, still has right facial swelling, but improving.  Appreciate ID consult.  Continue antibiotics and the guidance of ID.     Acute kidney injury. Renal function has improved.   Type 2 diabetes. Continue sliding scale insulin.   Essential hypertension Lisinopril on hold due to AKI.   Lupus anticoagulant syndrome. Continue Eliquis.   Schizophrenia. Stable      Subjective:  Right facial swelling seem to be better today.  Physical Exam: Vitals:   06/07/22 1451 06/07/22 2056 06/08/22 0011 06/08/22 0738  BP: 121/68 115/70 104/66 113/74  Pulse: 80 73 64 66  Resp: 17 16 18 17   Temp: (!) 97.3 F (36.3 C) 98.4 F (36.9 C) 98.6 F (37 C) 98.3 F (36.8 C)  TempSrc: Oral     SpO2: 98% 98% 96% 98%  Weight:       General exam: Appears calm and comfortable  Respiratory system: Clear to auscultation. Respiratory effort normal. Cardiovascular system: S1 & S2 heard, RRR. No JVD, murmurs, rubs, gallops or clicks. No pedal edema. Gastrointestinal system: Abdomen is nondistended, soft and nontender. No organomegaly or masses felt. Normal bowel sounds heard. Central nervous system: Alert and oriented. No focal neurological deficits. Extremities: Symmetric 5 x 5 power. Skin:  No rashes, lesions or ulcers Psychiatry: Judgement and insight appear normal. Mood & affect appropriate.  Right facial swelling is better.  Data Reviewed:  Blood culture still negative today.  Family Communication: None  Disposition: Status is: Inpatient Remains inpatient appropriate because: Severity of disease, IV treatment.  Planned Discharge Destination: Home    Time spent: 35 minutes  Author: , MD 06/08/2022 11:48 AM  For on call review [email or web address].

## 2022-06-09 DIAGNOSIS — E119 Type 2 diabetes mellitus without complications: Secondary | ICD-10-CM | POA: Diagnosis not present

## 2022-06-09 DIAGNOSIS — M272 Inflammatory conditions of jaws: Secondary | ICD-10-CM | POA: Diagnosis not present

## 2022-06-09 DIAGNOSIS — L03211 Cellulitis of face: Secondary | ICD-10-CM | POA: Diagnosis not present

## 2022-06-09 DIAGNOSIS — N179 Acute kidney failure, unspecified: Secondary | ICD-10-CM | POA: Diagnosis not present

## 2022-06-09 DIAGNOSIS — F203 Undifferentiated schizophrenia: Secondary | ICD-10-CM | POA: Diagnosis not present

## 2022-06-09 LAB — BASIC METABOLIC PANEL
Anion gap: 8 (ref 5–15)
BUN: 21 mg/dL — ABNORMAL HIGH (ref 6–20)
CO2: 23 mmol/L (ref 22–32)
Calcium: 9.6 mg/dL (ref 8.9–10.3)
Chloride: 111 mmol/L (ref 98–111)
Creatinine, Ser: 1.11 mg/dL (ref 0.61–1.24)
GFR, Estimated: >60 mL/min (ref >60)
Glucose, Bld: 125 mg/dL — ABNORMAL HIGH (ref 70–99)
Potassium: 4.8 mmol/L (ref 3.5–5.1)
Sodium: 142 mmol/L (ref 135–145)

## 2022-06-09 LAB — GLUCOSE, CAPILLARY
Glucose-Capillary: 140 mg/dL — ABNORMAL HIGH (ref 70–99)
Glucose-Capillary: 141 mg/dL — ABNORMAL HIGH (ref 70–99)

## 2022-06-09 MED ORDER — HYDROCODONE-ACETAMINOPHEN 5-325 MG PO TABS: 1.0000 | ORAL_TABLET | Freq: Four times a day (QID) | ORAL | 0 refills | Status: DC | PRN

## 2022-06-09 MED ORDER — LACTULOSE 10 GM/15ML PO SOLN
20.0000 g | Freq: Once | ORAL | Status: AC
  Administered 2022-06-09: 20 g via ORAL
  Filled 2022-06-09: qty 30

## 2022-06-09 MED ORDER — AMOXICILLIN-POT CLAVULANATE 875-125 MG PO TABS: 1.0000 | ORAL_TABLET | Freq: Two times a day (BID) | ORAL | 0 refills | Status: DC

## 2022-06-09 NOTE — Progress Notes (Signed)
   Date of Admission:  06/06/2022    ID: Lance [name] is a 46 y.o. male  Principal Problem:   Facial cellulitis Active Problems:   Hypertension   Diabetes mellitus without complication (HCC)   Lupus anticoagulant syndrome (HCC)   Schizophrenia (HCC)   AKI (acute kidney injury) (HCC)    Subjective: Pt doing better tolerated unasyn without any issues Facial swelling betetr Medications:   apixaban  5 mg Oral BID   clozapine  500 mg Oral QHS   hydrOXYzine  100 mg Oral QHS   insulin aspart  0-15 Units Subcutaneous TID WC   lithium carbonate  450 mg Oral Q12H   mometasone-formoterol  2 puff Inhalation BID   OLANZapine  20 mg Oral QHS   pneumococcal 23 valent vaccine  0.5 mL Intramuscular Tomorrow-1000   propranolol  20 mg Oral QHS   sertraline  50 mg Oral Daily   simvastatin  40 mg Oral q1800   sodium chloride flush  3 mL Intravenous Q12H   traZODone  100 mg Oral QHS    Objective: Vital signs in last 24 hours: Temp:  [98 F (36.7 C)-99.5 F (37.5 C)] 98 F (36.7 C) (12/26 0820) Pulse Rate:  [61-73] 61 (12/26 0820) Resp:  [16-17] 16 (12/26 0820) BP: (116-124)/(71-75) 116/72 (12/26 0820) SpO2:  [98 %-100 %] 98 % (12/26 0820)    PHYSICAL EXAM:  General: Alert, cooperative, no distress, appears stated age.  Rt facial swelling improved Poor dentition Lungs: Clear to auscultation bilaterally. No Wheezing or Rhonchi. No rales. Heart: Regular rate and rhythm, no murmur, rub or gallop. Abdomen: Soft, non-tender,not distended. Bowel sounds normal. No masses Extremities: atraumatic, no cyanosis. No edema. No clubbing Skin: No rashes or lesions. Or bruising Lymph: Cervical, supraclavicular normal. 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 No rash or hives or SOB We gave unasyn ( after an oral dose of augmentin )and he is tolerating it well So he will go on PO Augmentin for 2 weeks HE needs to follow up with oromaxillary facial surgeon as OP  Schizophrenia on many meds  Discussed the management with patient and Dr.Zhang

## 2022-06-09 NOTE — Progress Notes (Signed)
Discharged pt today, pt to go back to group home with two weeks of  po antibiotics to take in addition with a script for Vicodin. Called [name], Legal Guardian to inform her that a referral was placed for this pt to follow up with an oral surgeon for possible surgical intervention. Called [facility] oral office located in [place], the pt has an appointment set up for tomorrow at 1500 (06/10/2022). Faxed referral to [facility] Oral office suit 102, and also placed hard copy in chart for their records as well, updated [name] of everything.

## 2022-06-09 NOTE — TOC Transition Note (Signed)
Transition of Care [facility]) - CM/SW Discharge Note   Patient Details  Name: Lance [name] MRN: [id number] Date of Birth: 03/06/1976  Transition of [facility]) CM/SW Contact:  Colin Broach, LCSW Phone Number: 06/09/2022, 12:55 PM   Clinical Narrative:   CSW phoned pt's guardian, Maureen Ralphs, to inform her that pt is ready for discharge.  She stated that CSW needed to call group home.  CSW phone [facility] and spoke with Harriett Sine to let her know that pt is ready to be picked up. She stated that she will get someone to come and pick hi up from hospital.  No TOC needs.  TOC signing off.    Final next level of care: Home/Self Care Barriers to Discharge: No Barriers Identified   Patient Goals and CMS Choice      Discharge Placement                    Name of family member notified: [name] (guardian) and Harriett Sine [facility]) Patient and family notified of of transfer: 06/09/22  Discharge Plan and Services Additional resources added to the After Visit Summary for                                       Social Determinants of Health (SDOH) Interventions SDOH Screenings   Food Insecurity: No Food Insecurity (06/07/2022)  Housing: Low Risk  (06/07/2022)  Transportation Needs: No Transportation Needs (06/07/2022)  Utilities: Not At Risk (06/07/2022)  Alcohol Screen: Medium Risk (04/27/2022)  Depression (PHQ2-9): Low Risk  (03/20/2021)  Financial Resource Strain: Low Risk  (03/20/2021)  Physical Activity: Insufficiently Active (03/20/2021)  Social Connections: Socially Isolated (03/20/2021)  Stress: No Stress Concern Present (03/20/2021)  Tobacco Use: High Risk (06/06/2022)     Readmission Risk Interventions     No data to display

## 2022-06-09 NOTE — Plan of Care (Signed)
  Problem: Education: Goal: Ability to describe self-care measures that may prevent or decrease complications (Diabetes Survival Skills Education) will improve Outcome: Progressing Goal: Individualized Educational Video(s) Outcome: Progressing   Problem: Nutritional: Goal: Maintenance of adequate nutrition will improve Outcome: Progressing Goal: Progress toward achieving an optimal weight will improve Outcome: Progressing   Problem: Skin Integrity: Goal: Risk for impaired skin integrity will decrease Outcome: Progressing   Problem: Tissue Perfusion: Goal: Adequacy of tissue perfusion will improve Outcome: Progressing   Problem: Education: Goal: Knowledge of General Education information will improve Description: Including pain rating scale, medication(s)/side effects and non-pharmacologic comfort measures Outcome: Progressing

## 2022-06-09 NOTE — Plan of Care (Signed)
  Problem: Education: Goal: Ability to describe self-care measures that may prevent or decrease complications (Diabetes Survival Skills Education) will improve Outcome: Progressing   Problem: Skin Integrity: Goal: Risk for impaired skin integrity will decrease 06/09/2022 0057 by Laverta Baltimore, RN Outcome: Progressing 06/09/2022 0057 by Laverta Baltimore, RN Outcome: Progressing   Problem: Pain Managment: Goal: General experience of comfort will improve 06/09/2022 0057 by Laverta Baltimore, RN Outcome: Progressing 06/09/2022 0057 by Laverta Baltimore, RN Outcome: Progressing

## 2022-06-09 NOTE — Plan of Care (Signed)
  Problem: Skin Integrity: Goal: Risk for impaired skin integrity will decrease Outcome: Progressing   Problem: Pain Managment: Goal: General experience of comfort will improve Outcome: Progressing   

## 2022-06-09 NOTE — Discharge Summary (Signed)
Physician Discharge Summary   Patient: Lance [name] MRN: [id number] DOB: 12-Nov-1975  Admit date:     06/06/2022  Discharge date: 06/09/22  Discharge Physician: Sharen Hones   PCP: Cletis Athens, MD   Recommendations at discharge:   Follow-up with PCP in 1 week. Refer to oral surgery as below.  Discharge Diagnoses: Principal Problem:   Facial cellulitis Active Problems:   AKI (acute kidney injury) ([place])   Diabetes mellitus without complication ([place])   Hypertension   Lupus anticoagulant syndrome ([place])   Schizophrenia ([place])  Resolved Problems:   * No resolved hospital problems. *  Hospital Course: Lance [name] is a 46 y.o. male with medical history significant of hypertension, hyperlipidemia, lupus anticoagulant disorder c/b PE, schizophrenia, who presents to the ED with complaints of facial swelling.  Patient was diagnosed with facial cellulitis, was started on vancomycin and meropenem.  ID on board.  Assessment and Plan: Facial cellulitis. Appears secondary to dental infection. Patient has been seen by ID and ENT, currently on vancomycin and meropenem.  Blood cultures so far has no growth.  After seen by ID, patient was tested for allergies with penicillin.  Patient did not have any response to lower dose penicillin, Unasyn was started today.  Patient tolerated well. Patient condition has improved, discussed with ID, will treat with additional 14 days of Augmentin. Patient will be referred to oral surgery.  Also follow-up with PCP in 1 week.   Acute kidney injury. Renal function has improved.   Type 2 diabetes. Resume home regimen.   Essential hypertension Resumed home treatment.   Lupus anticoagulant syndrome. Continue Eliquis.   Schizophrenia. Patient condition stable, I did not make any adjustment to home treatment.       Consultants: ID, ENT Procedures performed: None  Disposition:  Group home Diet recommendation:  Discharge Diet Orders (From  admission, onward)     Start     Ordered   06/09/22 0000  Diet - low sodium heart healthy        06/09/22 1212           Cardiac diet DISCHARGE MEDICATION: Allergies as of 06/09/2022       Reactions   Penicillins Other (See Comments)   Reaction: "lockjaw" Has patient had a PCN reaction causing immediate rash, facial/tongue/throat swelling, SOB or lightheadedness with hypotension: No Has patient had a PCN reaction causing severe rash involving mucus membranes or skin necrosis: No Has patient had a PCN reaction that required hospitalization: No Has patient had a PCN reaction occurring within the last 10 years: No If all of the above answers are "NO", then may proceed with Cephalosporin use.        Medication List     STOP taking these medications    hydrOXYzine 25 MG tablet Commonly known as: ATARAX       TAKE these medications    albuterol 108 (90 Base) MCG/ACT inhaler Commonly known as: VENTOLIN HFA Inhale 1-2 puffs into the lungs every 6 (six) hours as needed for wheezing or shortness of breath.   amoxicillin-clavulanate 875-125 MG tablet Commonly known as: AUGMENTIN Take 1 tablet by mouth 2 (two) times daily for 14 days.   apixaban 5 MG Tabs tablet Commonly known as: Eliquis Take 1 tablet (5 mg total) by mouth 2 (two) times daily.   blood glucose meter kit and supplies Dispense based on patient and insurance preference. Use up to four times daily as directed. (FOR ICD-10 E10.9, E11.9).   clonazePAM  0.25 MG disintegrating tablet Commonly known as: KLONOPIN Take 1 tablet (0.25 mg total) by mouth 2 (two) times daily as needed (anxiety).   clozapine 200 MG tablet Commonly known as: CLOZARIL Take 1 tablet (200 mg total) by mouth at bedtime. What changed: how much to take   Fish Oil 1000 MG Caps Take 1,000 mg by mouth daily.   fluticasone-salmeterol 250-50 MCG/ACT Aepb Commonly known as: ADVAIR Inhale 1 puff into the lungs 2 (two) times daily.   FORA  V30a Blood Glucose Test test strip Generic drug: glucose blood CHECK BLOOD SUGAR TWICE DAILY.   hydrOXYzine 50 MG capsule Commonly known as: VISTARIL Take 2 capsules (100 mg total) by mouth at bedtime.   ibuprofen 600 MG tablet Commonly known as: ADVIL Take 1 tablet (600 mg total) by mouth every 6 (six) hours as needed for moderate pain.   insulin detemir 100 UNIT/ML injection Commonly known as: LEVEMIR Inject 0.1 mLs (10 Units total) into the skin 2 (two) times daily.   lisinopril 2.5 MG tablet Commonly known as: ZESTRIL Take 1 tablet (2.5 mg total) by mouth daily.   lithium carbonate 450 MG ER tablet Commonly known as: ESKALITH Take 1 tablet (450 mg total) by mouth at bedtime. What changed: when to take this   metFORMIN 1000 MG tablet Commonly known as: GLUCOPHAGE Take 1 tablet (1,000 mg total) by mouth 2 (two) times daily with a meal.   NovoFine Autocover Pen Needle 30G X 8 MM Misc Generic drug: Insulin Pen Needle USE WITH LEVEMIR TWICE DAILY.   OLANZapine 20 MG tablet Commonly known as: ZYPREXA Take 1 tablet (20 mg total) by mouth at bedtime.   propranolol 20 MG tablet Commonly known as: INDERAL Take 1 tablet (20 mg total) by mouth at bedtime.   sertraline 100 MG tablet Commonly known as: ZOLOFT Take 1 tablet (100 mg total) by mouth daily. What changed: how much to take   simvastatin 40 MG tablet Commonly known as: ZOCOR Take 1 tablet (40 mg total) by mouth daily at 6 PM.   traZODone 100 MG tablet Commonly known as: DESYREL Take 1 tablet (100 mg total) by mouth at bedtime.        Follow-up Information     Dorna Bloom, DDS Follow up in 2 week(s).   Specialty: Oral Surgery Why: ASAP. Contact information: [address] [place] [address] [phone]         Cletis Athens, MD Follow up in 1 week(s).   Specialties: Internal Medicine, Cardiology Contact information: [place] [address] [phone]                 Discharge Exam: Danley Danker Weights   06/06/22 1600  Weight: 72 kg   General exam: Appears calm and comfortable  Respiratory system: Clear to auscultation. Respiratory effort normal. Cardiovascular system: S1 & S2 heard, RRR. No JVD, murmurs, rubs, gallops or clicks. No pedal edema. Gastrointestinal system: Abdomen is nondistended, soft and nontender. No organomegaly or masses felt. Normal bowel sounds heard. Central nervous system: Alert and oriented. No focal neurological deficits. Extremities: Symmetric 5 x 5 power. Skin: No rashes, lesions or ulcers Psychiatry: Judgement and insight appear normal. Mood & affect appropriate.    Condition at discharge: good  The results of significant diagnostics from this hospitalization (including imaging, microbiology, ancillary and laboratory) are listed below for reference.   Imaging Studies: US RENAL  Result Date: 06/06/2022 CLINICAL DATA:  AK I EXAM: RENAL / URINARY TRACT  ULTRASOUND COMPLETE COMPARISON:  CT abdomen and pelvis 07/14/2021 FINDINGS: Right Kidney: Renal measurements: 10.1 x 4.2 x 5.1 cm = volume: 112.5 mL. Echogenicity within normal limits. No mass or hydronephrosis visualized. Left Kidney: Renal measurements: 9.7 x 5.8 x 5.1 cm = volume: 151 mL. Echogenicity within normal limits. 1.1 cm cyst in the mid left kidney. No follow-up recommended. No hydronephrosis. Bladder: Appears normal for degree of bladder distention. Other: None. IMPRESSION: Unremarkable renal ultrasound. Electronically Signed   By: Placido Sou M.D.   On: 06/06/2022 19:18   CT Soft Tissue Neck W Contrast  Result Date: 06/06/2022 CLINICAL DATA:  Swelling over right mandible. EXAM: CT NECK WITH CONTRAST TECHNIQUE: Multidetector CT imaging of the neck was performed using the standard protocol following the bolus administration of intravenous contrast. RADIATION DOSE REDUCTION: This exam was performed according to the departmental dose-optimization program  which includes automated exposure control, adjustment of the mA and/or kV according to patient size and/or use of iterative reconstruction technique. CONTRAST:  26m OMNIPAQUE IOHEXOL 300 MG/ML  SOLN COMPARISON:  None Available. FINDINGS: Pharynx and larynx: The nasal cavity and nasopharynx are unremarkable The oral cavity and oropharynx are unremarkable. The parapharyngeal spaces are clear. The hypopharynx and larynx are unremarkable. The vocal folds are normal in appearance There is no retropharyngeal fluid collection.  The airway is patent. Salivary glands: The parotid and submandibular glands are unremarkable. Thyroid: Unremarkable. Lymph nodes: There is no pathologic lymphadenopathy in the neck. Vascular: The major vasculature of the neck is unremarkable. Limited intracranial: The imaged portions of the intracranial compartment are unremarkable. Visualized orbits: The imaged globes and orbits are unremarkable. Mastoids and visualized paranasal sinuses: Clear. Skeleton: There is no acute osseous abnormality or suspicious osseous lesion. Upper chest: The imaged lung apices are clear. Other: There is significant swelling in the right face with subcutaneous fat stranding, scattered free fluid, and asymmetric thickening of the right platysma and masseter musculature. There is no abscess. There are multiple dental caries. IMPRESSION: 1. Significant swelling in the right face with subcutaneous fat stranding, scattered free fluid, and asymmetric thickening of the right platysma and masseter musculature most suggestive of cellulitis and infectious/inflammatory myositis. No abscess identified. 2. Multiple dental caries but no clear offending tooth identified. Electronically Signed   By: PValetta MoleM.D.   On: 06/06/2022 14:35    Microbiology: Results for orders placed or performed during the hospital encounter of 06/06/22  Culture, blood (Routine X 2) w Reflex to ID Panel     Status: None (Preliminary result)    Collection Time: 06/06/22  5:27 PM   Specimen: BLOOD  Result Value Ref Range Status   Specimen Description BLOOD BLOOD LEFT ARM  Final   Special Requests   Final    BOTTLES DRAWN AEROBIC AND ANAEROBIC Blood Culture adequate volume   Culture   Final    NO GROWTH 3 DAYS [facility] [address] [place] [address]   Report Status PENDING  Incomplete  Culture, blood (Routine X 2) w Reflex to ID Panel     Status: None (Preliminary result)   Collection Time: 06/06/22  5:29 PM   Specimen: BLOOD  Result Value Ref Range Status   Specimen Description BLOOD BLOOD LEFT ARM  Final   Special Requests   Final    BOTTLES DRAWN AEROBIC AND ANAEROBIC Blood Culture adequate volume   Culture   Final    NO GROWTH 3 DAYS [facility]  [place] [place] [address]    Report Status PENDING  Incomplete    Labs: CBC: Recent Labs  Lab 06/06/22 1040 06/07/22 0418  WBC 9.0 6.4  NEUTROABS  --  4.8  HGB 14.4 12.8*  HCT 43.0 37.2*  MCV 91.7 91.0  PLT 193 514   Basic Metabolic Panel: Recent Labs  Lab 06/06/22 1040 06/07/22 0418 06/09/22 0751  NA 135 140 142  K 4.3 4.5 4.8  CL 104 111 111  CO2 _0 GLUCOSE 174* 133* 125*  BUN 19 23* 21*  CREATININE 1.61* 1.30* 1.11  CALCIUM 9.4 8.9 9.6   Liver Function Tests: No results for input(s): "AST", "ALT", "ALKPHOS", "BILITOT", "PROT", "ALBUMIN" in the last 168 hours. CBG: Recent Labs  Lab 06/08/22 1142 06/08/22 1645 06/08/22 2215 06/09/22 0800 06/09/22 1146  GLUCAP 142* 185* 172* 140* 141*    Discharge time spent: greater than 30 minutes.  Signed: Sharen Hones, MD [facility] 06/09/2022

## 2022-06-11 LAB — CULTURE, BLOOD (ROUTINE X 2)
Culture: NO GROWTH
Culture: NO GROWTH
Special Requests: ADEQUATE
Special Requests: ADEQUATE

## 2022-06-16 ENCOUNTER — Other Ambulatory Visit: Payer: Self-pay

## 2022-06-16 ENCOUNTER — Encounter: Payer: Self-pay | Admitting: Emergency Medicine

## 2022-06-16 DIAGNOSIS — G4700 Insomnia, unspecified: Secondary | ICD-10-CM | POA: Insufficient documentation

## 2022-06-16 DIAGNOSIS — F1290 Cannabis use, unspecified, uncomplicated: Secondary | ICD-10-CM | POA: Insufficient documentation

## 2022-06-16 DIAGNOSIS — Z20822 Contact with and (suspected) exposure to covid-19: Secondary | ICD-10-CM | POA: Insufficient documentation

## 2022-06-16 DIAGNOSIS — F17200 Nicotine dependence, unspecified, uncomplicated: Secondary | ICD-10-CM | POA: Insufficient documentation

## 2022-06-16 DIAGNOSIS — Z7901 Long term (current) use of anticoagulants: Secondary | ICD-10-CM | POA: Insufficient documentation

## 2022-06-16 DIAGNOSIS — Z79899 Other long term (current) drug therapy: Secondary | ICD-10-CM | POA: Insufficient documentation

## 2022-06-16 DIAGNOSIS — F250 Schizoaffective disorder, bipolar type: Secondary | ICD-10-CM | POA: Insufficient documentation

## 2022-06-16 DIAGNOSIS — Z7984 Long term (current) use of oral hypoglycemic drugs: Secondary | ICD-10-CM | POA: Insufficient documentation

## 2022-06-16 DIAGNOSIS — F29 Unspecified psychosis not due to a substance or known physiological condition: Secondary | ICD-10-CM | POA: Insufficient documentation

## 2022-06-16 DIAGNOSIS — E119 Type 2 diabetes mellitus without complications: Secondary | ICD-10-CM | POA: Insufficient documentation

## 2022-06-16 DIAGNOSIS — F203 Undifferentiated schizophrenia: Secondary | ICD-10-CM | POA: Diagnosis not present

## 2022-06-16 DIAGNOSIS — I10 Essential (primary) hypertension: Secondary | ICD-10-CM | POA: Insufficient documentation

## 2022-06-16 DIAGNOSIS — Z794 Long term (current) use of insulin: Secondary | ICD-10-CM | POA: Insufficient documentation

## 2022-06-16 DIAGNOSIS — F209 Schizophrenia, unspecified: Secondary | ICD-10-CM | POA: Diagnosis not present

## 2022-06-16 DIAGNOSIS — R443 Hallucinations, unspecified: Secondary | ICD-10-CM | POA: Diagnosis not present

## 2022-06-16 LAB — URINE DRUG SCREEN, QUALITATIVE (ARMC ONLY)
Amphetamines, Ur Screen: NOT DETECTED
Barbiturates, Ur Screen: NOT DETECTED
Benzodiazepine, Ur Scrn: NOT DETECTED
Cannabinoid 50 Ng, Ur ~~LOC~~: POSITIVE — AB
Cocaine Metabolite,Ur ~~LOC~~: NOT DETECTED
MDMA (Ecstasy)Ur Screen: NOT DETECTED
Methadone Scn, Ur: NOT DETECTED
Opiate, Ur Screen: NOT DETECTED
Phencyclidine (PCP) Ur S: NOT DETECTED
Tricyclic, Ur Screen: NOT DETECTED

## 2022-06-16 LAB — CBC
HCT: 38.7 % — ABNORMAL LOW (ref 39.0–52.0)
Hemoglobin: 13 g/dL (ref 13.0–17.0)
MCH: 30.2 pg (ref 26.0–34.0)
MCHC: 33.6 g/dL (ref 30.0–36.0)
MCV: 89.8 fL (ref 80.0–100.0)
Platelets: 437 10*3/uL — ABNORMAL HIGH (ref 150–400)
RBC: 4.31 MIL/uL (ref 4.22–5.81)
RDW: 13 % (ref 11.5–15.5)
WBC: 7.7 10*3/uL (ref 4.0–10.5)
nRBC: 0 % (ref 0.0–0.2)

## 2022-06-16 LAB — COMPREHENSIVE METABOLIC PANEL
ALT: 24 U/L (ref 0–44)
AST: 29 U/L (ref 15–41)
Albumin: 4.1 g/dL (ref 3.5–5.0)
Alkaline Phosphatase: 65 U/L (ref 38–126)
Anion gap: 9 (ref 5–15)
BUN: 31 mg/dL — ABNORMAL HIGH (ref 6–20)
CO2: 21 mmol/L — ABNORMAL LOW (ref 22–32)
Calcium: 8.9 mg/dL (ref 8.9–10.3)
Chloride: 108 mmol/L (ref 98–111)
Creatinine, Ser: 1.41 mg/dL — ABNORMAL HIGH (ref 0.61–1.24)
GFR, Estimated: >60 mL/min (ref >60)
Glucose, Bld: 120 mg/dL — ABNORMAL HIGH (ref 70–99)
Potassium: 4 mmol/L (ref 3.5–5.1)
Sodium: 138 mmol/L (ref 135–145)
Total Bilirubin: 0.6 mg/dL (ref 0.3–1.2)
Total Protein: 6.7 g/dL (ref 6.5–8.1)

## 2022-06-16 LAB — SALICYLATE LEVEL: Salicylate Lvl: <7 mg/dL — ABNORMAL LOW (ref 7.0–30.0)

## 2022-06-16 LAB — ETHANOL: Alcohol, Ethyl (B): <10 mg/dL (ref <10)

## 2022-06-16 LAB — ACETAMINOPHEN LEVEL: Acetaminophen (Tylenol), Serum: <10 ug/mL — ABNORMAL LOW (ref 10–30)

## 2022-06-16 NOTE — ED Triage Notes (Signed)
Pt to ED BIB [employer] voluntarily, police called by patient, states he's been hallucinating, seeing visions and hallucinations, feels like he's been traveling through space and hell.  States sometimes he gets aggravated and has thoughts about harming self and sometimes others if he's in the wrong situation or they rub him wrong, patient denies wanting to hurt anyone at hospital.  States if he were to harm self he would overdose on pills.  Denies alcohol use today, states has been taking OTC relaxation pills.  States hx of schizophrenia, pt talking about aliens and dragons joining forces in triage.  1 orange sweatshirt 1 green sweatshirt 1 blue zip up jacket 1 red scarf 1 pair blue jeans 1 green beanie 1 orange scarf 1 black scarf 1 pair red shoes 1 black zip up jacket 1 pair blue socks 1 black leather bag 1 Nobles belt 1 blue plaid boxers 1 pair black jeans 1 orange bracelet 1 black bracelet 1 green bracelet 1 purple bracelet 1 white/yellow bracelet 1 blue t shirt

## 2022-06-17 ENCOUNTER — Inpatient Hospital Stay
Admission: AD | Admit: 2022-06-17 | Discharge: 2022-06-30 | DRG: 885 | Disposition: A | Discharge status: Home / Self Care | Payer: Medicare Other | Source: Intra-hospital | Attending: Psychiatry | Admitting: Psychiatry

## 2022-06-17 ENCOUNTER — Emergency Department (EMERGENCY_DEPARTMENT_HOSPITAL)
Admission: EM | Admit: 2022-06-17 | Discharge: 2022-06-17 | Disposition: A | Discharge status: Other Acute Inpatient Hospital | Payer: Medicare Other | Source: Home / Self Care

## 2022-06-17 ENCOUNTER — Encounter: Payer: Self-pay | Admitting: Psychiatry

## 2022-06-17 DIAGNOSIS — R443 Hallucinations, unspecified: Secondary | ICD-10-CM | POA: Diagnosis not present

## 2022-06-17 DIAGNOSIS — J45909 Unspecified asthma, uncomplicated: Secondary | ICD-10-CM | POA: Diagnosis present

## 2022-06-17 DIAGNOSIS — E860 Dehydration: Secondary | ICD-10-CM | POA: Diagnosis present

## 2022-06-17 DIAGNOSIS — I10 Essential (primary) hypertension: Secondary | ICD-10-CM | POA: Diagnosis present

## 2022-06-17 DIAGNOSIS — F200 Paranoid schizophrenia: Secondary | ICD-10-CM | POA: Diagnosis not present

## 2022-06-17 DIAGNOSIS — F23 Brief psychotic disorder: Secondary | ICD-10-CM | POA: Diagnosis present

## 2022-06-17 DIAGNOSIS — F209 Schizophrenia, unspecified: Principal | ICD-10-CM | POA: Diagnosis present

## 2022-06-17 DIAGNOSIS — F319 Bipolar disorder, unspecified: Secondary | ICD-10-CM | POA: Diagnosis present

## 2022-06-17 DIAGNOSIS — Z7984 Long term (current) use of oral hypoglycemic drugs: Secondary | ICD-10-CM

## 2022-06-17 DIAGNOSIS — Z86711 Personal history of pulmonary embolism: Secondary | ICD-10-CM

## 2022-06-17 DIAGNOSIS — D6862 Lupus anticoagulant syndrome: Secondary | ICD-10-CM | POA: Diagnosis present

## 2022-06-17 DIAGNOSIS — F203 Undifferentiated schizophrenia: Secondary | ICD-10-CM | POA: Diagnosis present

## 2022-06-17 DIAGNOSIS — F250 Schizoaffective disorder, bipolar type: Secondary | ICD-10-CM | POA: Diagnosis present

## 2022-06-17 DIAGNOSIS — F419 Anxiety disorder, unspecified: Secondary | ICD-10-CM | POA: Diagnosis present

## 2022-06-17 DIAGNOSIS — G4700 Insomnia, unspecified: Secondary | ICD-10-CM | POA: Diagnosis present

## 2022-06-17 DIAGNOSIS — F17200 Nicotine dependence, unspecified, uncomplicated: Secondary | ICD-10-CM | POA: Diagnosis present

## 2022-06-17 DIAGNOSIS — F1290 Cannabis use, unspecified, uncomplicated: Secondary | ICD-10-CM

## 2022-06-17 DIAGNOSIS — E119 Type 2 diabetes mellitus without complications: Secondary | ICD-10-CM

## 2022-06-17 DIAGNOSIS — Z20822 Contact with and (suspected) exposure to covid-19: Secondary | ICD-10-CM | POA: Diagnosis present

## 2022-06-17 DIAGNOSIS — F29 Unspecified psychosis not due to a substance or known physiological condition: Secondary | ICD-10-CM | POA: Diagnosis present

## 2022-06-17 DIAGNOSIS — Z794 Long term (current) use of insulin: Secondary | ICD-10-CM

## 2022-06-17 DIAGNOSIS — Z7901 Long term (current) use of anticoagulants: Secondary | ICD-10-CM | POA: Diagnosis not present

## 2022-06-17 DIAGNOSIS — E7800 Pure hypercholesterolemia, unspecified: Secondary | ICD-10-CM | POA: Diagnosis present

## 2022-06-17 DIAGNOSIS — Z86718 Personal history of other venous thrombosis and embolism: Secondary | ICD-10-CM | POA: Diagnosis not present

## 2022-06-17 LAB — RESP PANEL BY RT-PCR (RSV, FLU A&B, COVID)  RVPGX2
Influenza A by PCR: NEGATIVE
Influenza B by PCR: NEGATIVE
Resp Syncytial Virus by PCR: NEGATIVE
SARS Coronavirus 2 by RT PCR: NEGATIVE

## 2022-06-17 LAB — GLUCOSE, CAPILLARY: Glucose-Capillary: 183 mg/dL — ABNORMAL HIGH (ref 70–99)

## 2022-06-17 MED ORDER — SERTRALINE HCL 100 MG PO TABS
100.0000 mg | ORAL_TABLET | Freq: Every day | ORAL | Status: DC
  Administered 2022-06-17 – 2022-06-30 (×14): 100 mg via ORAL
  Filled 2022-06-17 (×14): qty 1

## 2022-06-17 MED ORDER — CLONAZEPAM 0.25 MG PO TBDP
0.2500 mg | ORAL_TABLET | Freq: Two times a day (BID) | ORAL | Status: DC | PRN
  Administered 2022-06-19 – 2022-06-26 (×2): 0.25 mg via ORAL
  Filled 2022-06-17 (×2): qty 1

## 2022-06-17 MED ORDER — CLOZAPINE 100 MG PO TABS
200.0000 mg | ORAL_TABLET | Freq: Every day | ORAL | Status: DC
  Administered 2022-06-17 – 2022-06-29 (×13): 200 mg via ORAL
  Filled 2022-06-17 (×13): qty 2

## 2022-06-17 MED ORDER — ALBUTEROL SULFATE HFA 108 (90 BASE) MCG/ACT IN AERS: 2.0000 | INHALATION_SPRAY | Freq: Four times a day (QID) | RESPIRATORY_TRACT | Status: DC | PRN

## 2022-06-17 MED ORDER — INSULIN DETEMIR 100 UNIT/ML ~~LOC~~ SOLN
10.0000 [IU] | Freq: Two times a day (BID) | SUBCUTANEOUS | Status: DC
  Administered 2022-06-17 – 2022-06-30 (×26): 10 [IU] via SUBCUTANEOUS
  Filled 2022-06-17 (×27): qty 0.1

## 2022-06-17 MED ORDER — OLANZAPINE 10 MG PO TABS
20.0000 mg | ORAL_TABLET | Freq: Every day | ORAL | Status: DC
  Administered 2022-06-17 – 2022-06-29 (×13): 20 mg via ORAL
  Filled 2022-06-17 (×13): qty 2

## 2022-06-17 MED ORDER — LITHIUM CARBONATE ER 450 MG PO TBCR
450.0000 mg | EXTENDED_RELEASE_TABLET | Freq: Every day | ORAL | Status: DC
  Administered 2022-06-17 – 2022-06-29 (×13): 450 mg via ORAL
  Filled 2022-06-17 (×13): qty 1

## 2022-06-17 MED ORDER — HYDROXYZINE HCL 50 MG PO TABS
50.0000 mg | ORAL_TABLET | Freq: Four times a day (QID) | ORAL | Status: DC | PRN
  Administered 2022-06-21 – 2022-06-29 (×9): 50 mg via ORAL
  Filled 2022-06-17 (×10): qty 1

## 2022-06-17 MED ORDER — ACETAMINOPHEN 325 MG PO TABS
650.0000 mg | ORAL_TABLET | Freq: Four times a day (QID) | ORAL | Status: DC | PRN
  Administered 2022-06-17 – 2022-06-20 (×3): 650 mg via ORAL
  Filled 2022-06-17 (×5): qty 2

## 2022-06-17 MED ORDER — PROPRANOLOL HCL 20 MG PO TABS
20.0000 mg | ORAL_TABLET | Freq: Every day | ORAL | Status: DC
  Administered 2022-06-17 – 2022-06-29 (×13): 20 mg via ORAL
  Filled 2022-06-17 (×13): qty 1

## 2022-06-17 MED ORDER — HYDROXYZINE HCL 25 MG PO TABS
25.0000 mg | ORAL_TABLET | Freq: Three times a day (TID) | ORAL | Status: DC | PRN
  Administered 2022-06-17: 25 mg via ORAL
  Filled 2022-06-17 (×2): qty 1

## 2022-06-17 MED ORDER — LISINOPRIL 5 MG PO TABS
2.5000 mg | ORAL_TABLET | Freq: Every day | ORAL | Status: DC
  Administered 2022-06-17 – 2022-06-30 (×14): 2.5 mg via ORAL
  Filled 2022-06-17 (×14): qty 1

## 2022-06-17 MED ORDER — ALUM & MAG HYDROXIDE-SIMETH 200-200-20 MG/5ML PO SUSP: 30.0000 mL | ORAL | Status: DC | PRN

## 2022-06-17 MED ORDER — MOMETASONE FURO-FORMOTEROL FUM 200-5 MCG/ACT IN AERO
2.0000 | INHALATION_SPRAY | Freq: Two times a day (BID) | RESPIRATORY_TRACT | Status: DC
  Administered 2022-06-17 – 2022-06-30 (×26): 2 via RESPIRATORY_TRACT
  Filled 2022-06-17: qty 8.8

## 2022-06-17 MED ORDER — ALBUTEROL SULFATE (2.5 MG/3ML) 0.083% IN NEBU: 2.5000 mg | INHALATION_SOLUTION | Freq: Four times a day (QID) | RESPIRATORY_TRACT | Status: DC | PRN

## 2022-06-17 MED ORDER — MAGNESIUM HYDROXIDE 400 MG/5ML PO SUSP: 30.0000 mL | Freq: Every day | ORAL | Status: DC | PRN

## 2022-06-17 MED ORDER — TRAZODONE HCL 100 MG PO TABS
100.0000 mg | ORAL_TABLET | Freq: Every day | ORAL | Status: DC
  Administered 2022-06-17 – 2022-06-29 (×13): 100 mg via ORAL
  Filled 2022-06-17 (×13): qty 1

## 2022-06-17 MED ORDER — METFORMIN HCL 500 MG PO TABS
1000.0000 mg | ORAL_TABLET | Freq: Two times a day (BID) | ORAL | Status: DC
  Administered 2022-06-17 – 2022-06-30 (×26): 1000 mg via ORAL
  Filled 2022-06-17 (×27): qty 2

## 2022-06-17 MED ORDER — SIMVASTATIN 40 MG PO TABS
40.0000 mg | ORAL_TABLET | Freq: Every day | ORAL | Status: DC
  Administered 2022-06-17 – 2022-06-29 (×13): 40 mg via ORAL
  Filled 2022-06-17 (×13): qty 1

## 2022-06-17 MED ORDER — APIXABAN 5 MG PO TABS
5.0000 mg | ORAL_TABLET | Freq: Two times a day (BID) | ORAL | Status: DC
  Administered 2022-06-17 – 2022-06-30 (×26): 5 mg via ORAL
  Filled 2022-06-17 (×27): qty 1

## 2022-06-17 MED ORDER — NICOTINE POLACRILEX 2 MG MT GUM
2.0000 mg | CHEWING_GUM | OROMUCOSAL | Status: DC | PRN
  Administered 2022-06-18 – 2022-06-30 (×29): 2 mg via ORAL
  Filled 2022-06-17 (×33): qty 1

## 2022-06-17 NOTE — Plan of Care (Signed)
  Problem: Education: Goal: Knowledge of General Education information will improve Description: Including pain rating scale, medication(s)/side effects and non-pharmacologic comfort measures Outcome: Progressing   Problem: Education: Goal: Knowledge of [place] [facility] Education information/materials will improve Outcome: Progressing   Problem: Coping: Goal: Ability to verbalize frustrations and anger appropriately will improve Outcome: Progressing

## 2022-06-17 NOTE — ED Notes (Signed)
Pt. Alert and oriented, warm and dry, in no distress. Pt. Denies SI, HI, and AVH. Pt states he has been having some anxiety but not at this moment. Patient advised of the rules of the unit and made aware of security cameras.  Pt. Encouraged to let nursing staff know of any concerns or needs.  ENVIRONMENTAL ASSESSMENT Potentially harmful objects out of patient reach: Yes.   Personal belongings secured: Yes.   Patient dressed in hospital provided attire only: Yes.   Plastic bags out of patient reach: Yes.   Patient care equipment (cords, cables, call bells, lines, and drains) shortened, removed, or accounted for: Yes.   Equipment and supplies removed from bottom of stretcher: Yes.   Potentially toxic materials out of patient reach: Yes.   Sharps container removed or out of patient reach: Yes.

## 2022-06-17 NOTE — ED Notes (Signed)
Breakfast and beverage provided 

## 2022-06-17 NOTE — Progress Notes (Signed)
Patient calm and pleasant during assessment denies SI/HI/AVH. Pt stated he needed a break from his group home and doesn't want to go back to the same place. Pt given education, support, and encouragement to be active in his treatment plan. Pt compliant with medication administration per MD orders. Pt being monitored Q 15 minutes for safety per unit protocol, remains safe on the unit

## 2022-06-17 NOTE — ED Notes (Addendum)
Report given to [facility] from [facility]

## 2022-06-17 NOTE — ED Provider Notes (Signed)
Emergency Medicine Observation Re-evaluation Note  Lance [name] is a 47 y.o. male, seen on rounds today.  Pt initially presented to the ED for complaints of Hallucinations (/) Currently, the patient is resting comfortably.  Physical Exam  BP 113/62 (BP Location: Right Arm)   Pulse 64   Temp 97.9 F (36.6 C) (Oral)   Resp 16   Ht 5\' 10"  (1.778 m)   Wt 79.4 kg   SpO2 97%   BMI 25.11 kg/m  Physical Exam General: No acute distress Cardiac: Well-perfused extremities Lungs: No respiratory distress Psych: Appropriate mood and affect  ED Course / MDM  EKG:   I have reviewed the labs performed to date as well as medications administered while in observation.  Recent changes in the last 24 hours include none.  Plan  Current plan is for placement.    Naaman Plummer, MD 06/17/22 [phone]
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
## 2022-06-17 NOTE — BH Assessment (Signed)
Patient is to be admitted to [facility] today 06/17/22 by Dr. Weber Cooks.  Attending Physician will be Dr.  Weber Cooks .   Patient has been assigned to room 322, by [place] Nurse Bella Kennedy.    ER staff is aware of the admission: Apolonio Schneiders, ER Secretary   Dr. Cheri [name], ER MD  Seth Bake, Patient's Nurse  Seth Bake, Patient Access.

## 2022-06-17 NOTE — Consult Note (Signed)
[place] [facility] Consult   Reason for Consult: Hallucinations (/)  Referring Physician: Dr. Beather Arbour Patient Identification: Lance [name] MRN:  [id number] Principal Diagnosis: <principal problem not specified> Diagnosis:  Active Problems:   Hypertension   Diabetes mellitus without complication ([place])   Schizoaffective disorder, bipolar type ([place])   Tobacco use disorder   Undifferentiated schizophrenia ([place])   Acute psychosis ([place])   Insomnia   Schizophrenia ([place])   Total Time spent with patient: 1 hour  Subjective: "The worker said he was going to kill me."  Lance [name] is a 47 y.o. male patient presented to [facility] ED via law enforcement voluntarily. The patient shared that he left his group home because the people in the home were trying to kill him. The patient became agitated during his assessment and began screaming. The patient's UDS is remarkable for Cannabinoids but unremarkable for any of his prescribed medications. The patient shared that he does not know how the Cannabis got into his body. He said, "I think someone must have put it in my food." He became irritated and began yelling and wanted psych to leave his room.   Per the ED triage nurses note, Pt to ED BIB [employer] voluntarily, police called by patient, states he's been hallucinating, seeing visions and hallucinations, feels like he's been traveling through space and hell.  States sometimes he gets aggravated and has thoughts about harming self and sometimes others if he's in the wrong situation or they rub him wrong, patient denies wanting to hurt anyone at hospital.  States if he were to harm self he would overdose on pills.  Denies alcohol use today, states has been taking OTC relaxation pills.  States hx of schizophrenia, pt talking about aliens and dragons joining forces in triage.   This provider saw The patient face-to-face; the chart was reviewed, and Dr. Beather Arbour was consulted on 06/17/2022 due to the patient's  care. It was discussed with the EDP  that the patient would remain under observation overnight and be reassessed in the morning due to his active hallucinations and being easily irritated.  On evaluation, the patient is alert and oriented x 3, anxious, cooperative initially, then uncooperative during his assessment, and mood-congruent with affect.  The patient does appear to be responding to internal and external stimuli. The patient is presenting with some delusional thinking. The patient admits to auditory and visual hallucinations. The patient denies any suicidal, homicidal, or self-harm ideations. The patient is presenting with some psychotic and paranoid behaviors.   HPI: Per Dr. Beather Arbour, Lance [name] is a 47 y.o. male  brought to the ED by [employer] voluntarily for hallucinations. Patient with a history of schizophrenia. Denies active SI/HI/AH/VH. Voices no medical complaints.   Past Psychiatric History:  Depression Schizo affective schizophrenia ([place])   Risk to Self:   Risk to Others:   Prior Inpatient Therapy:   Prior Outpatient Therapy:    Past Medical History:  Past Medical History:  Diagnosis Date   Depression    Diabetes mellitus without complication ([place])    Hyperlipidemia    Hypertension    Lupus anticoagulant disorder ([place]) 06/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   PE (pulmonary thromboembolism) ([place])    Schizo affective schizophrenia ([place])    Supratherapeutic INR 11/19/2016   History reviewed. No pertinent surgical history. Family History:  Family History  Problem Relation Age of Onset   CAD Mother    CAD Sister    Family Psychiatric  History: History reviewed. No pertinent family psychiatric history Social History:  Social History   Substance and Sexual Activity  Alcohol Use Yes   Comment: occassionally     Social History   Substance and Sexual Activity  Drug Use Not Currently   Types: Marijuana   Comment: Last use 11/29/16    Social History    Socioeconomic History   Marital status: Single    Spouse name: Not on file   Number of children: Not on file   Years of education: Not on file   Highest education level: Not on file  Occupational History   Not on file  Tobacco Use   Smoking status: Every Day    Packs/day: 1.00    Years: 23.00    Total pack years: 23.00    Types: Cigarettes   Smokeless tobacco: Never   Tobacco comments:    will provide material  Substance and Sexual Activity   Alcohol use: Yes    Comment: occassionally   Drug use: Not Currently    Types: Marijuana    Comment: Last use 11/29/16   Sexual activity: Never    Birth control/protection: None    Comment: occasional marijuana- none recently  Other Topics Concern   Not on file  Social History Narrative   From a group home in [place] Determinants of Health   Financial Resource Strain: Low Risk  (03/20/2021)   Overall Financial Resource Strain (CARDIA)    Difficulty of Paying Living Expenses: Not very hard  Food Insecurity: No Food Insecurity (06/07/2022)   Hunger Vital Sign    Worried About Running Out of Food in the Last Year: Never true    Ran Out of Food in the Last Year: Never true  Transportation Needs: No Transportation Needs (06/07/2022)   PRAPARE - Hydrologist (Medical): No    Lack of Transportation (Non-Medical): No  Physical Activity: Insufficiently Active (03/20/2021)   Exercise Vital Sign    Days of Exercise per Week: 3 days    Minutes of Exercise per Session: 20 min  Stress: No Stress Concern Present (03/20/2021)   [place]    Feeling of Stress : Only a little  Social Connections: Socially Isolated (03/20/2021)   Social Connection and Isolation Panel [NHANES]    Frequency of Communication with Friends and Family: More than three times a week    Frequency of Social Gatherings with Friends and Family: More than three times  a week    Attends Religious Services: Never    Marine scientist or Organizations: No    Attends Archivist Meetings: Never    Marital Status: Never married   Additional Social History:    Allergies:   Allergies  Allergen Reactions   Penicillins Other (See Comments)    Reaction: "lockjaw" as teen Tolerated Augmentin/Unasyn 06/09/22      Labs:  Results for orders placed or performed during the hospital encounter of 06/17/22 (from the past 48 hour(s))  Resp panel by RT-PCR (RSV, Flu A&B, Covid) Anterior Nasal Swab     Status: None   Collection Time: 06/16/22  8:59 PM   Specimen: Anterior Nasal Swab  Result Value Ref Range   SARS Coronavirus 2 by RT PCR NEGATIVE NEGATIVE    Comment: (NOTE) SARS-CoV-2 target nucleic acids are NOT DETECTED.  The SARS-CoV-2 RNA is generally detectable in upper respiratory specimens during the acute phase of infection.  The lowest concentration of SARS-CoV-2 viral copies this assay can detect is 138 copies/mL. A negative result does not preclude SARS-Cov-2 infection and should not be used as the sole basis for treatment or other patient management decisions. A negative result may occur with  improper specimen collection/handling, submission of specimen other than nasopharyngeal swab, presence of viral mutation(s) within the areas targeted by this assay, and inadequate number of viral copies(<138 copies/mL). A negative result must be combined with clinical observations, patient history, and epidemiological information. The expected result is Negative.  Fact Sheet for Patients:  EntrepreneurPulse.com.au  Fact Sheet for Healthcare Providers:  IncredibleEmployment.be  This test is no t yet approved or cleared by the [place] FDA and  has been authorized for detection and/or diagnosis of SARS-CoV-2 by FDA under an Emergency Use Authorization (EUA). This EUA will remain  in effect (meaning  this test can be used) for the duration of the COVID-19 declaration under Section 564(b)(1) of the Act, 21 U.S.C.section 360bbb-3(b)(1), unless the authorization is terminated  or revoked sooner.       Influenza A by PCR NEGATIVE NEGATIVE   Influenza B by PCR NEGATIVE NEGATIVE    Comment: (NOTE) The Xpert Xpress SARS-CoV-2/FLU/RSV plus assay is intended as an aid in the diagnosis of influenza from Nasopharyngeal swab specimens and should not be used as a sole basis for treatment. Nasal washings and aspirates are unacceptable for Xpert Xpress SARS-CoV-2/FLU/RSV testing.  Fact Sheet for Patients: EntrepreneurPulse.com.au  Fact Sheet for Healthcare Providers: IncredibleEmployment.be  This test is not yet approved or cleared by the [place] FDA and has been authorized for detection and/or diagnosis of SARS-CoV-2 by FDA under an Emergency Use Authorization (EUA). This EUA will remain in effect (meaning this test can be used) for the duration of the COVID-19 declaration under Section 564(b)(1) of the Act, 21 U.S.C. section 360bbb-3(b)(1), unless the authorization is terminated or revoked.     Resp Syncytial Virus by PCR NEGATIVE NEGATIVE    Comment: (NOTE) Fact Sheet for Patients: EntrepreneurPulse.com.au  Fact Sheet for Healthcare Providers: IncredibleEmployment.be  This test is not yet approved or cleared by the [place] FDA and has been authorized for detection and/or diagnosis of SARS-CoV-2 by FDA under an Emergency Use Authorization (EUA). This EUA will remain in effect (meaning this test can be used) for the duration of the COVID-19 declaration under Section 564(b)(1) of the Act, 21 U.S.C. section 360bbb-3(b)(1), unless the authorization is terminated or revoked.  Performed at [facility], [address] [place], [place] [address]   Comprehensive metabolic panel     Status:  Abnormal   Collection Time: 06/16/22  9:02 PM  Result Value Ref Range   Sodium 138 135 - 145 mmol/L   Potassium 4.0 3.5 - 5.1 mmol/L   Chloride 108 98 - 111 mmol/L   CO2 21 (L) 22 - 32 mmol/L   Glucose, Bld 120 (H) 70 - 99 mg/dL    Comment: Glucose reference range applies only to samples taken after fasting for at least 8 hours.   BUN 31 (H) 6 - 20 mg/dL   Creatinine, Ser 1.41 (H) 0.61 - 1.24 mg/dL   Calcium 8.9 8.9 - 10.3 mg/dL   Total Protein 6.7 6.5 - 8.1 g/dL   Albumin 4.1 3.5 - 5.0 g/dL   AST 29 15 - 41 U/L   ALT 24 0 - 44 U/L   Alkaline Phosphatase 65 38 - 126 U/L   Total Bilirubin 0.6 0.3 -  1.2 mg/dL   GFR, Estimated >60 >60 mL/min    Comment: (NOTE) Calculated using the CKD-EPI Creatinine Equation (2021)    Anion gap 9 5 - 15    Comment: Performed at [facility], [address] [place], [place] [address]  Ethanol     Status: None   Collection Time: 06/16/22  9:02 PM  Result Value Ref Range   Alcohol, Ethyl (B) <10 <10 mg/dL    Comment: (NOTE) Lowest detectable limit for serum alcohol is 10 mg/dL.  For medical purposes only. Performed at [facility], [address] [place], [place] [address]   Salicylate level     Status: Abnormal   Collection Time: 06/16/22  9:02 PM  Result Value Ref Range   Salicylate Lvl <5.1 (L) 7.0 - 30.0 mg/dL    Comment: Performed at [facility], [address] [place], [place] [address]  Acetaminophen level     Status: Abnormal   Collection Time: 06/16/22  9:02 PM  Result Value Ref Range   Acetaminophen (Tylenol), Serum <10 (L) 10 - 30 ug/mL    Comment: (NOTE) Therapeutic concentrations vary significantly. A range of 10-30 ug/mL  may be an effective concentration for many patients. However, some  are best treated at concentrations outside of this range. Acetaminophen concentrations >150 ug/mL at 4 hours after ingestion  and >50 ug/mL at 12 hours after ingestion are often associated with  toxic  reactions.  Performed at [facility], [address] [place], [place] [address]   cbc     Status: Abnormal   Collection Time: 06/16/22  9:02 PM  Result Value Ref Range   WBC 7.7 4.0 - 10.5 K/uL   RBC 4.31 4.22 - 5.81 MIL/uL   Hemoglobin 13.0 13.0 - 17.0 g/dL   HCT 38.7 (L) 39.0 - 52.0 %   MCV 89.8 80.0 - 100.0 fL   MCH 30.2 26.0 - 34.0 pg   MCHC 33.6 30.0 - 36.0 g/dL   RDW 13.0 11.5 - 15.5 %   Platelets 437 (H) 150 - 400 K/uL   nRBC 0.0 0.0 - 0.2 %    Comment: Performed at [facility], [address] [place],  [address]  Urine Drug Screen, Qualitative     Status: Abnormal   Collection Time: 06/16/22  9:02 PM  Result Value Ref Range   Tricyclic, Ur Screen NONE DETECTED NONE DETECTED   Amphetamines, Ur Screen NONE DETECTED NONE DETECTED   MDMA (Ecstasy)Ur Screen NONE DETECTED NONE DETECTED   Cocaine Metabolite,Ur [place] NONE DETECTED NONE DETECTED   Opiate, Ur Screen NONE DETECTED NONE DETECTED   Phencyclidine (PCP) Ur S NONE DETECTED NONE DETECTED   Cannabinoid 50 Ng, Ur [place] POSITIVE (A) NONE DETECTED   Barbiturates, Ur Screen NONE DETECTED NONE DETECTED   Benzodiazepine, Ur Scrn NONE DETECTED NONE DETECTED   Methadone Scn, Ur NONE DETECTED NONE DETECTED    Comment: (NOTE) Tricyclics + metabolites, urine    Cutoff 1000 ng/mL Amphetamines + metabolites, urine  Cutoff 1000 ng/mL MDMA (Ecstasy), urine              Cutoff 500 ng/mL Cocaine Metabolite, urine          Cutoff 300 ng/mL Opiate + metabolites, urine        Cutoff 300 ng/mL Phencyclidine (PCP), urine         Cutoff 25 ng/mL Cannabinoid, urine  Cutoff 50 ng/mL Barbiturates + metabolites, urine  Cutoff 200 ng/mL Benzodiazepine, urine              Cutoff 200 ng/mL Methadone, urine                   Cutoff 300 ng/mL  The urine drug screen provides only a preliminary, unconfirmed analytical test result and should not be used for non-medical purposes. Clinical consideration and  professional judgment should be applied to any positive drug screen result due to possible interfering substances. A more specific alternate chemical method must be used in order to obtain a confirmed analytical result. Gas chromatography / mass spectrometry (GC/MS) is the preferred confirm atory method. Performed at [facility], [address] [place], [place] [address]     No current facility-administered medications for this encounter.   Current Outpatient Medications  Medication Sig Dispense Refill   albuterol (VENTOLIN HFA) 108 (90 Base) MCG/ACT inhaler Inhale 1-2 puffs into the lungs every 6 (six) hours as needed for wheezing or shortness of breath. 18 g 1   amoxicillin-clavulanate (AUGMENTIN) 875-125 MG tablet Take 1 tablet by mouth 2 (two) times daily for 14 days. 28 tablet 0   apixaban (ELIQUIS) 5 MG TABS tablet Take 1 tablet (5 mg total) by mouth 2 (two) times daily. 60 tablet 1   blood glucose meter kit and supplies Dispense based on patient and insurance preference. Use up to four times daily as directed. (FOR ICD-10 E10.9, E11.9). 1 each 6   clonazePAM (KLONOPIN) 0.25 MG disintegrating tablet Take 1 tablet (0.25 mg total) by mouth 2 (two) times daily as needed (anxiety). 60 tablet 1   cloZAPine (CLOZARIL) 200 MG tablet Take 1 tablet (200 mg total) by mouth at bedtime. (Patient taking differently: Take 500 mg by mouth at bedtime.) 30 tablet 1   fluticasone-salmeterol (ADVAIR) 250-50 MCG/ACT AEPB Inhale 1 puff into the lungs 2 (two) times daily. 14 each 2   glucose blood (FORA V30A BLOOD GLUCOSE TEST) test strip CHECK BLOOD SUGAR TWICE DAILY. 100 strip 3   HYDROcodone-acetaminophen (NORCO/VICODIN) 5-325 MG tablet Take 1 tablet by mouth every 6 (six) hours as needed for severe pain. 12 tablet 0   hydrOXYzine (VISTARIL) 50 MG capsule Take 2 capsules (100 mg total) by mouth at bedtime. 30 capsule 1   ibuprofen (ADVIL) 600 MG tablet Take 1 tablet (600 mg total) by mouth  every 6 (six) hours as needed for moderate pain. 60 tablet 1   insulin detemir (LEVEMIR) 100 UNIT/ML injection Inject 0.1 mLs (10 Units total) into the skin 2 (two) times daily. 10 mL 1   lisinopril (ZESTRIL) 2.5 MG tablet Take 1 tablet (2.5 mg total) by mouth daily. 30 tablet 1   lithium carbonate (ESKALITH) 450 MG CR tablet Take 1 tablet (450 mg total) by mouth at bedtime. (Patient taking differently: Take 450 mg by mouth 2 (two) times daily.) 60 tablet 1   metFORMIN (GLUCOPHAGE) 1000 MG tablet Take 1 tablet (1,000 mg total) by mouth 2 (two) times daily with a meal. 60 tablet 1   NOVOFINE AUTOCOVER PEN NEEDLE 30G X 8 MM MISC USE WITH LEVEMIR TWICE DAILY. 100 each 11   OLANZapine (ZYPREXA) 20 MG tablet Take 1 tablet (20 mg total) by mouth at bedtime. 30 tablet 1   Omega-3 Fatty Acids (FISH OIL) 1000 MG CAPS Take 1,000 mg by mouth daily.     propranolol (INDERAL) 20 MG tablet Take 1 tablet (20 mg total)  by mouth at bedtime. (Patient not taking: Reported on 06/06/2022) 30 tablet 1   sertraline (ZOLOFT) 100 MG tablet Take 1 tablet (100 mg total) by mouth daily. (Patient taking differently: Take 50 mg by mouth daily.) 30 tablet 1   simvastatin (ZOCOR) 40 MG tablet Take 1 tablet (40 mg total) by mouth daily at 6 PM. 30 tablet 1   traZODone (DESYREL) 100 MG tablet Take 1 tablet (100 mg total) by mouth at bedtime. 30 tablet 1    Musculoskeletal: Strength & Muscle Tone: within normal limits Gait & Station: normal Patient leans: N/A  Psychiatric Specialty Exam:  Presentation  General Appearance:  Bizarre  Eye Contact: Minimal  Speech: Pressured  Speech Volume: Increased  Handedness: Right   Mood and Affect  Mood: Anxious; Euphoric; Irritable  Affect: Congruent   Thought Process  Thought Processes: Disorganized  Descriptions of Associations:Loose  Orientation:Full (Time, Place and Person)  Thought Content:Illogical  History of Schizophrenia/Schizoaffective  disorder:Yes  Duration of Psychotic Symptoms:Greater than six months  Hallucinations:Hallucinations: Visual; Auditory Description of Auditory Hallucinations: Voices, yelling at him Description of Visual Hallucinations: Did not say  Ideas of Reference:Paranoia  Suicidal Thoughts:Suicidal Thoughts: No  Homicidal Thoughts:Homicidal Thoughts: No   Sensorium  Memory: Immediate Fair; Recent Fair; Remote Fair  Judgment: Poor  Insight: Poor   Executive Functions  Concentration: Poor  Attention Span: Poor  Recall: Poor  Fund of Knowledge: Poor  Language: Poor   Psychomotor Activity  Psychomotor Activity: Psychomotor Activity: Normal   Assets  Assets: Armed forces logistics/support/administrative officer; Desire for Improvement; Housing; Physical Health; Resilience; Social Support   Sleep  Sleep: Sleep: Fair Number of Hours of Sleep: 6   Physical Exam: Physical Exam Vitals and nursing note reviewed.  Constitutional:      Appearance: Normal appearance. He is normal weight.  HENT:     Head: Normocephalic and atraumatic.     Right Ear: External ear normal.     Left Ear: External ear normal.     Nose: Nose normal.     Mouth/Throat:     Mouth: Mucous membranes are dry.  Cardiovascular:     Rate and Rhythm: Normal rate.     Pulses: Normal pulses.  Pulmonary:     Effort: Pulmonary effort is normal.  Musculoskeletal:        General: Normal range of motion.     Cervical back: Normal range of motion and neck supple.  Neurological:     Mental Status: He is alert and oriented to person, place, and time.  Psychiatric:        Attention and Perception: He perceives auditory and visual hallucinations.        Mood and Affect: Mood is anxious. Affect is angry and inappropriate.        Speech: Speech is rapid and pressured.        Behavior: Behavior is uncooperative.        Thought Content: Thought content is paranoid and delusional.        Cognition and Memory: Cognition is impaired.         Judgment: Judgment is inappropriate.    Review of Systems  Psychiatric/Behavioral:  Positive for hallucinations and substance abuse. The patient is nervous/anxious.   All other systems reviewed and are negative.  Blood pressure 113/62, pulse 64, temperature 97.9 F (36.6 C), temperature source Oral, resp. rate 16, height _0  (1.778 m), weight 79.4 kg, SpO2 97 %. Body mass index is 25.11 kg/m.  Treatment Plan Summary: Plan  The patient would remain under observation overnight and be reassessed in the morning. Due to him being positive for Cannabinoid and voicing active auditory and visual hallucinations. The patient has been medication non-complaint.  Disposition: Supportive therapy provided about ongoing stressors. The patient would remain under observation overnight and be reassessed in the morning. Due to him being positive for Cannabinoid and voicing active auditory and visual hallucinations.  Caroline Sauger, NP 06/17/2022 4:25 AM

## 2022-06-17 NOTE — ED Provider Notes (Signed)
[facility] Provider Note    None    (approximate)   History   Hallucinations (/)   HPI  Lance [name] is a 47 y.o. male  brought to the ED by [employer] voluntarily for hallucinations. Patient with a history of schizophrenia. Denies active SI/HI/AH/VH. Voices no medical complaints.     Past Medical History   Past Medical History:  Diagnosis Date   Depression    Diabetes mellitus without complication ([place])    Hyperlipidemia    Hypertension    Lupus anticoagulant disorder ([place]) 06/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 No pertinent surgical history.   Home Medications   Prior to Admission medications   Medication Sig Start Date End Date Taking? Authorizing Provider  albuterol (VENTOLIN HFA) 108 (90 Base) MCG/ACT inhaler Inhale 1-2 puffs into the lungs every 6 (six) hours as needed for wheezing or shortness of breath. 05/04/22   Clapacs, Madie Reno, MD  amoxicillin-clavulanate (AUGMENTIN) 875-125 MG tablet Take 1 tablet by mouth 2 (two) times daily for 14 days. 06/09/22 06/23/22  Sharen Hones, MD  apixaban (ELIQUIS) 5 MG TABS tablet Take 1 tablet (5 mg total) by mouth 2 (two) times daily. 05/04/22   Clapacs, Madie Reno, MD  blood glucose meter kit and supplies Dispense based on patient and insurance preference. Use up to four times daily as directed. (FOR ICD-10 E10.9, E11.9). 07/01/21   Cletis Athens, MD  clonazePAM (KLONOPIN) 0.25 MG disintegrating tablet Take 1 tablet (0.25 mg total) by mouth 2 (two) times daily as needed (anxiety). 05/04/22   Clapacs, Madie Reno, MD  cloZAPine (CLOZARIL) 200 MG tablet Take 1 tablet (200 mg total) by mouth at bedtime. Patient taking differently: Take 500 mg by mouth at bedtime. 05/04/22   Clapacs, Madie Reno, MD  fluticasone-salmeterol (ADVAIR) 250-50 MCG/ACT AEPB Inhale 1 puff into the lungs 2 (two) times daily. 05/04/22   Clapacs, Madie Reno, MD  glucose blood (FORA V30A BLOOD GLUCOSE TEST) test strip CHECK BLOOD SUGAR TWICE DAILY. 08/08/21   Cletis Athens, MD  HYDROcodone-acetaminophen (NORCO/VICODIN) 5-325 MG tablet Take 1 tablet by mouth every 6 (six) hours as needed for severe pain. 06/09/22   Sharen Hones, MD  hydrOXYzine (VISTARIL) 50 MG capsule Take 2 capsules (100 mg total) by mouth at bedtime. 05/04/22   Clapacs, Madie Reno, MD  ibuprofen (ADVIL) 600 MG tablet Take 1 tablet (600 mg total) by mouth every 6 (six) hours as needed for moderate pain. 05/04/22   Clapacs, Madie Reno, MD  insulin detemir (LEVEMIR) 100 UNIT/ML injection Inject 0.1 mLs (10 Units total) into the skin 2 (two) times daily. 05/04/22  Clapacs, Madie Reno, MD  lisinopril (ZESTRIL) 2.5 MG tablet Take 1 tablet (2.5 mg total) by mouth daily. 05/05/22   Clapacs, Madie Reno, MD  lithium carbonate (ESKALITH) 450 MG CR tablet Take 1 tablet (450 mg total) by mouth at bedtime. Patient taking differently: Take 450 mg by mouth 2 (two) times daily. 05/04/22   Clapacs, Madie Reno, MD  metFORMIN (GLUCOPHAGE) 1000 MG tablet Take 1 tablet (1,000 mg total) by mouth 2 (two) times daily with a meal. 05/04/22   Clapacs, Madie Reno, MD  NOVOFINE AUTOCOVER PEN NEEDLE 30G X 8 MM MISC USE WITH LEVEMIR TWICE DAILY. 12/24/20   Cletis Athens, MD  OLANZapine (ZYPREXA) 20 MG tablet Take 1 tablet (20 mg total) by mouth at bedtime. 05/04/22   Clapacs, Madie Reno, MD  Omega-3 Fatty Acids (FISH OIL) 1000 MG CAPS Take 1,000 mg by mouth daily.    [provider]  propranolol (INDERAL) 20 MG tablet Take 1 tablet (20 mg total) by mouth at bedtime. Patient not taking: Reported on 06/06/2022 05/04/22   Clapacs, Madie Reno, MD  sertraline (ZOLOFT) 100 MG tablet Take 1 tablet (100 mg total) by mouth daily. Patient taking differently: Take 50 mg by mouth daily. 05/04/22   Clapacs, Madie Reno, MD  simvastatin (ZOCOR) 40 MG tablet Take 1 tablet (40 mg total) by mouth daily at 6 PM. 05/04/22   Clapacs, Madie Reno, MD  traZODone (DESYREL) 100 MG tablet Take 1 tablet (100 mg total) by mouth at bedtime. 05/04/22   Clapacs, Madie Reno, MD     Allergies  Penicillins   Family History   Family History  Problem Relation Age of Onset   CAD Mother    CAD Sister      Physical Exam  Triage Vital Signs: ED Triage Vitals [06/16/22 2101]  Enc Vitals Group     BP 113/62     Pulse Rate 64     Resp 16     Temp 97.9 F (36.6 C)     Temp Source Oral     SpO2 97 %     Weight 175 lb (79.4 kg)     Height _0  (1.778 m)     Head Circumference      Peak Flow      Pain Score 0     Pain Loc      Pain Edu?      Excl. in [place]?     Updated Vital Signs: BP 113/62 (BP Location: Right Arm)   Pulse 64   Temp 97.9 F (36.6 C) (Oral)   Resp 16   Ht _1  (1.778 m)   Wt 79.4 kg   SpO2 97%   BMI 25.11 kg/m    General: Awake, no distress.  CV:  Good peripheral perfusion.  Resp:  Normal effort.  Abd:  No distention.  Other:  Disheveled, flat affect.   ED Results / Procedures / Treatments  Labs (all labs ordered are listed, but only abnormal results are displayed) Labs Reviewed  COMPREHENSIVE METABOLIC PANEL - Abnormal; Notable for the following  components:      Result Value   CO2 21 (*)    Glucose, Bld 120 (*)    BUN 31 (*)    Creatinine, Ser 1.41 (*)    All other components within normal limits  SALICYLATE LEVEL - Abnormal; Notable for the following components:   Salicylate Lvl <9.1 (*)    All other components within normal limits  ACETAMINOPHEN LEVEL -  Abnormal; Notable for the following components:   Acetaminophen (Tylenol), Serum <10 (*)    All other components within normal limits  CBC - Abnormal; Notable for the following components:   HCT 38.7 (*)    Platelets 437 (*)    All other components within normal limits  URINE DRUG SCREEN, QUALITATIVE ([facility] ONLY) - Abnormal; Notable for the following components:   Cannabinoid 50 Ng, Ur [place] POSITIVE (*)    All other components within normal limits  RESP PANEL BY RT-PCR (RSV, FLU A&B, COVID)  RVPGX2  ETHANOL     EKG  None   RADIOLOGY None   Official radiology report(s): No results found.   PROCEDURES:  Critical Care performed: No  Procedures   MEDICATIONS ORDERED IN ED: Medications - No data to display   IMPRESSION / MDM / [place] / ED COURSE  I reviewed the triage vital signs and the nursing notes.                             47 year old male presenting with hallucinations.  Contracts for safety while in the emergency department.  He is medically cleared for psychiatric consultation and disposition.  Patient's presentation is most consistent with exacerbation of chronic illness.  The patient has been placed in psychiatric observation due to the need to provide a safe environment for the patient while obtaining psychiatric consultation and evaluation, as well as ongoing medical and medication management to treat the patient's condition.  The patient has not been placed under full IVC at this time.  [id number] Patient seen by overnight psychiatry team who will reassess in the morning.  FINAL CLINICAL IMPRESSION(S) / ED DIAGNOSES   Final  diagnoses:  Hallucinations  Marijuana use     Rx / DC Orders   ED Discharge Orders     None        Note:  This document was prepared using Dragon voice recognition software and may include unintentional dictation errors.   Paulette Blanch, MD 06/17/22 [phone]

## 2022-06-17 NOTE — [facility] Suicide Risk Assessment (Signed)
[facility] Admission Suicide Risk Assessment   Nursing information obtained from:    Demographic factors:  Male, Low socioeconomic status, Unemployed Current Mental Status:  Suicidal ideation indicated by patient, Thoughts of violence towards others Loss Factors:  Financial problems / change in socioeconomic status Historical Factors:  NA Risk Reduction Factors:  NA  Total Time spent with patient: 45 minutes Principal Problem: Schizophrenia ([place]) Diagnosis:  Principal Problem:   Schizophrenia ([place])  Subjective Data: Patient seen and chart reviewed.  47 year old man with schizophrenia well-known to our service.  Came to the emergency room today saying he was having psychotic symptoms had been off his medicine for a few days.  Patient is currently denying any suicidal thought.  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  He has been cooperative and pleasant here on the unit.  Continued Clinical Symptoms:  Alcohol Use Disorder Identification Test Final Score (AUDIT): 4 The "Alcohol Use Disorders Identification Test", Guidelines for Use in Primary Care, Second Edition.  World Pharmacologist [facility]). Score between 0-7:  no or low risk or alcohol related problems. Score between 8-15:  moderate risk of alcohol related problems. Score between 16-19:  high risk of alcohol related problems. Score 20 or above:  warrants further diagnostic evaluation for alcohol dependence and treatment.   CLINICAL FACTORS:   Schizophrenia:   Paranoid or undifferentiated type   Musculoskeletal: Strength & Muscle Tone: within normal limits Gait & Station: normal Patient leans: N/A  Psychiatric Specialty Exam:  Presentation  General Appearance:  Bizarre  Eye Contact: Minimal  Speech: Pressured  Speech Volume: Increased  Handedness: Right   Mood and Affect  Mood: Anxious; Euphoric;  Irritable  Affect: Congruent   Thought Process  Thought Processes: Disorganized  Descriptions of Associations:Loose  Orientation:Full (Time, Place and Person)  Thought Content:Illogical  History of Schizophrenia/Schizoaffective disorder:Yes  Duration of Psychotic Symptoms:Greater than six months  Hallucinations:Hallucinations: Visual; Auditory Description of Auditory Hallucinations: Voices, yelling at him Description of Visual Hallucinations: Did not say  Ideas of Reference:Paranoia  Suicidal Thoughts:Suicidal Thoughts: No  Homicidal Thoughts:Homicidal Thoughts: No   Sensorium  Memory: Immediate Fair; Recent Fair; Remote Fair  Judgment: Poor  Insight: Poor   Executive Functions  Concentration: Poor  Attention Span: Poor  Recall: Poor  Fund of Knowledge: Poor  Language: Poor   Psychomotor Activity  Psychomotor Activity: Psychomotor Activity: Normal   Assets  Assets: Armed forces logistics/support/administrative officer; Desire for Improvement; Housing; Physical Health; Resilience; Social Support   Sleep  Sleep: Sleep: Fair Number of Hours of Sleep: 6    Physical Exam: Physical Exam Vitals reviewed.  Constitutional:      Appearance: Normal appearance.  HENT:     Head: Normocephalic and atraumatic.     Mouth/Throat:     Pharynx: Oropharynx is clear.  Eyes:     Pupils: Pupils are equal, round, and reactive to light.  Cardiovascular:     Rate and Rhythm: Normal rate and regular rhythm.  Pulmonary:     Effort: Pulmonary effort is normal.     Breath sounds: Normal breath sounds.  Abdominal:     General: Abdomen is flat.     Palpations: Abdomen is soft.  Musculoskeletal:        General: Normal range of motion.  Skin:    General: Skin is warm and dry.  Neurological:     General: No focal deficit present.     Mental Status: He  is alert. Mental status is at baseline.  Psychiatric:        Attention and Perception: Attention normal.        Mood and Affect:  Mood normal.        Speech: Speech normal.        Behavior: Behavior normal.        Thought Content: Thought content normal.        Cognition and Memory: Cognition normal.        Judgment: Judgment normal.    Review of Systems  Constitutional: Negative.   HENT: Negative.    Eyes: Negative.   Respiratory: Negative.    Cardiovascular: Negative.   Gastrointestinal: Negative.   Musculoskeletal: Negative.   Skin: Negative.   Neurological: Negative.   Psychiatric/Behavioral:  Positive for hallucinations and substance abuse. Negative for depression and suicidal ideas. The patient is nervous/anxious and has insomnia.    Blood pressure 112/60, pulse (!) 54, temperature 98.1 F (36.7 C), temperature source Oral, resp. rate 18, height 5' 10.5" (1.791 m), weight 75.3 kg, SpO2 100 %. Body mass index is 23.48 kg/m.   COGNITIVE FEATURES THAT CONTRIBUTE TO RISK:  Thought constriction (tunnel vision)    SUICIDE RISK:   Minimal: No identifiable suicidal ideation.  Patients presenting with no risk factors but with morbid ruminations; may be classified as minimal risk based on the severity of the depressive symptoms  PLAN OF CARE: Continue 15-minute checks.  Restart psychiatric medicines as they were last time he was here in the hospital.  Treatment team will meet with him and we will include him daily and appropriate groups and activities and work on discharge planning with ongoing assessment of any dangerousness.  I certify that inpatient services furnished can reasonably be expected to improve the patient's condition.   Alethia Berthold, MD 06/17/2022, 4:43 PM

## 2022-06-17 NOTE — Tx Team (Signed)
Initial Treatment Plan 06/17/2022 4:25 PM [name] [id number]    PATIENT STRESSORS: Financial difficulties   Medication change or noncompliance   Other: Living arrangements     PATIENT STRENGTHS: General fund of knowledge  Motivation for treatment/growth  Religious Affiliation    PATIENT IDENTIFIED PROBLEMS: Depression   AVH  Anxiety   Living arrangements   Medications             DISCHARGE CRITERIA:  Adequate post-discharge living arrangements Improved stabilization in mood, thinking, and/or behavior Verbal commitment to aftercare and medication compliance  PRELIMINARY DISCHARGE PLAN: Outpatient therapy Placement in alternative living arrangements  PATIENT/FAMILY INVOLVEMENT: This treatment plan has been presented to and reviewed with the patient, Lance [name].  The patient and family have been given the opportunity to ask questions and make suggestions.  Gerrianne Scale, RN 06/17/2022, 4:25 PM
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
## 2022-06-17 NOTE — Group Note (Signed)
BHH LCSW Group Therapy Note   Group Date: 06/17/2022 Start Time: 1300 End Time: 1400   Type of Therapy/Topic:  Group Therapy:  Emotion Regulation  Participation Level:  Did Not Attend    Description of Group:    The purpose of this group is to assist patients in learning to regulate negative emotions and experience positive emotions. Patients will be guided to discuss ways in which they have been vulnerable to their negative emotions. These vulnerabilities will be juxtaposed with experiences of positive emotions or situations, and patients challenged to use positive emotions to combat negative ones. Special emphasis will be placed on coping with negative emotions in conflict situations, and patients will process healthy conflict resolution skills.  Therapeutic Goals: Patient will identify two positive emotions or experiences to reflect on in order to balance out negative emotions:  Patient will label two or more emotions that they find the most difficult to experience:  Patient will be able to demonstrate positive conflict resolution skills through discussion or role plays:   Summary of Patient Progress: X   Therapeutic Modalities:   Cognitive Behavioral Therapy Feelings Identification Dialectical Behavioral Therapy   Khair Chasteen R Lissa Rowles, LCSW 

## 2022-06-17 NOTE — ED Notes (Signed)
vol/ the patient would remain under observation overnight and be reassessed in the morning.

## 2022-06-18 DIAGNOSIS — F200 Paranoid schizophrenia: Secondary | ICD-10-CM | POA: Diagnosis not present

## 2022-06-18 LAB — CBC WITH DIFFERENTIAL/PLATELET
Abs Immature Granulocytes: 0.04 10*3/uL (ref 0.00–0.07)
Basophils Absolute: 0 10*3/uL (ref 0.0–0.1)
Basophils Relative: 0 %
Eosinophils Absolute: 0 10*3/uL (ref 0.0–0.5)
Eosinophils Relative: 0 %
HCT: 38.3 % — ABNORMAL LOW (ref 39.0–52.0)
Hemoglobin: 12.7 g/dL — ABNORMAL LOW (ref 13.0–17.0)
Immature Granulocytes: 1 %
Lymphocytes Relative: 29 %
Lymphs Abs: 1.4 10*3/uL (ref 0.7–4.0)
MCH: 30.5 pg (ref 26.0–34.0)
MCHC: 33.2 g/dL (ref 30.0–36.0)
MCV: 91.8 fL (ref 80.0–100.0)
Monocytes Absolute: 0.5 10*3/uL (ref 0.1–1.0)
Monocytes Relative: 11 %
Neutro Abs: 2.9 10*3/uL (ref 1.7–7.7)
Neutrophils Relative %: 59 %
Platelets: 331 10*3/uL (ref 150–400)
RBC: 4.17 MIL/uL — ABNORMAL LOW (ref 4.22–5.81)
RDW: 13.2 % (ref 11.5–15.5)
WBC: 4.9 10*3/uL (ref 4.0–10.5)
nRBC: 0 % (ref 0.0–0.2)

## 2022-06-18 LAB — GLUCOSE, CAPILLARY: Glucose-Capillary: 115 mg/dL — ABNORMAL HIGH (ref 70–99)

## 2022-06-18 MED ORDER — ADULT MULTIVITAMIN W/MINERALS CH
1.0000 | ORAL_TABLET | Freq: Every day | ORAL | Status: DC
  Administered 2022-06-19 – 2022-06-30 (×12): 1 via ORAL
  Filled 2022-06-18 (×12): qty 1

## 2022-06-18 MED ORDER — ENSURE MAX PROTEIN PO LIQD
11.0000 [oz_av] | Freq: Two times a day (BID) | ORAL | Status: DC
  Administered 2022-06-18 – 2022-06-22 (×9): 11 [oz_av] via ORAL
  Administered 2022-06-23 – 2022-06-24 (×4): 237 mL via ORAL
  Administered 2022-06-25: 11 [oz_av] via ORAL
  Administered 2022-06-25: 237 mL via ORAL
  Administered 2022-06-26: 11 [oz_av] via ORAL
  Administered 2022-06-26 – 2022-06-28 (×4): 237 mL via ORAL
  Administered 2022-06-28 – 2022-06-30 (×4): 11 [oz_av] via ORAL

## 2022-06-18 NOTE — Progress Notes (Signed)
   06/18/22 1200  Spiritual Encounters  Type of Visit Initial  Care provided to: Patient  Conversation partners present during encounter Other (comment) (Chaplain)  Referral source Patient request  Reason for visit Routine spiritual support   Chaplain responded to consult. Chaplain provided compassionate presence and reflective listening as patient spoke about his life and why he is here. Patient requested a Bible and positive affirmations he could put on his wall. Chaplain will follow up. Chaplain service are available as needed.
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
## 2022-06-18 NOTE — B[facility] Group Notes (Signed)
[facility] Group Notes:  (Nursing/MHT/Case Management/Adjunct)  Date:  06/18/2022  Time:  8:28 PM  Type of Therapy:   Wrap up  Participation Level:  Active  Participation Quality:  Appropriate  Affect:  Appropriate  Cognitive:  Alert  Insight:  Good  Engagement in Group:  Engaged  Modes of Intervention:   Goal is to  get medicine right and get some rest  Summary of Progress/Problems:  Lance [name] 06/18/2022, 8:28 PM
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
## 2022-06-18 NOTE — Progress Notes (Signed)
Recreation Therapy Notes    Date: 06/18/2022  Time: 10:05 am  Location: Craft room     Behavioral response: N/A   Intervention Topic: Time Management    Discussion/Intervention: Patient refused to attend group.   Clinical Observations/Feedback:  Patient refused to attend group.    Lance [name] LRT/CTRS        Lance [name] 06/18/2022 11:33 AM 

## 2022-06-18 NOTE — Plan of Care (Signed)
  Problem: Coping: Goal: Level of anxiety will decrease Outcome: Progressing   Problem: Pain Managment: Goal: General experience of comfort will improve Outcome: Progressing   Problem: Coping: Goal: Will verbalize feelings Outcome: Progressing
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
## 2022-06-18 NOTE — Progress Notes (Signed)
[facility] MD Progress Note  06/18/2022 11:16 AM [name]  MRN:  [id number] Subjective: Follow-up for this gentleman with a history of schizophrenia.  No behavior problems today.  Cooperative with medication.  Patient requested that he have a Gatorade every day saying that he had gotten very dehydrated outside of the hospital.  This seems harmless and I have added it as an order.  Otherwise he is satisfied with his medicine.  He repeated to me his request that he have a new group home found.  And claimed that he was frightened to go back to the current 1 Principal Problem: Schizophrenia ([place]) Diagnosis: Principal Problem:   Schizophrenia ([place])  Total Time spent with patient: 30 minutes  Past Psychiatric History: Past history of schizophrenia  Past Medical History:  Past Medical History:  Diagnosis Date   Depression    Diabetes mellitus without complication ([place])    Hyperlipidemia    Hypertension    Lupus anticoagulant disorder ([place]) 06/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   PE (pulmonary thromboembolism) ([place])    Schizo affective schizophrenia ([place])    Supratherapeutic INR 11/19/2016   History reviewed. No pertinent surgical history. Family History:  Family History  Problem Relation Age of Onset   CAD Mother    CAD Sister    Family Psychiatric  History: See previous Social History:  Social History   Substance and Sexual Activity  Alcohol Use Yes   Comment: occassionally     Social History   Substance and Sexual Activity  Drug Use Yes   Types: Marijuana   Comment: Last use 11/29/16    Social History   Socioeconomic History   Marital status: Single    Spouse name: Not on file   Number of children: Not on file   Years of education: Not on file   Highest education level: Not on file  Occupational History   Not on file  Tobacco Use   Smoking status: Every Day    Packs/day: 1.00    Years: 23.00    Total pack years: 23.00    Types: Cigarettes   Smokeless  tobacco: Never   Tobacco comments:    will provide material  Substance and Sexual Activity   Alcohol use: Yes    Comment: occassionally   Drug use: Yes    Types: Marijuana    Comment: Last use 11/29/16   Sexual activity: Not Currently    Birth control/protection: None    Comment: occasional marijuana- none recently  Other Topics Concern   Not on file  Social History Narrative   From a group home in [place] Strain: Low Risk  (03/20/2021)   Overall Financial Resource Strain (CARDIA)    Difficulty of Paying Living Expenses: Not very hard  Food Insecurity: Food Insecurity Present (06/17/2022)   Hunger Vital Sign    Worried About Running Out of Food in the Last Year: Sometimes true    Ran Out of Food in the Last Year: Sometimes true  Transportation Needs: Unmet Transportation Needs (06/17/2022)   PRAPARE - Transportation    Lack of Transportation (Medical): Yes    Lack of Transportation (Non-Medical): Yes  Physical Activity: Insufficiently Active (03/20/2021)   Exercise Vital Sign    Days of Exercise per Week: 3 days    Minutes of Exercise per Session: 20 min  Stress: No Stress Concern Present (03/20/2021)   [employer] [place] -  Occupational Stress Questionnaire    Feeling of Stress : Only a little  Social Connections: Socially Isolated (03/20/2021)   Social Connection and Isolation Panel [NHANES]    Frequency of Communication with Friends and Family: More than three times a week    Frequency of Social Gatherings with Friends and Family: More than three times a week    Attends Religious Services: Never    Marine scientist or Organizations: No    Attends Music therapist: Never    Marital Status: Never married   Additional Social History:                         Sleep: Fair  Appetite:  Fair  Current Medications: Current Facility-Administered Medications  Medication Dose  Route Frequency Provider Last Rate Last Admin   acetaminophen (TYLENOL) tablet 650 mg  650 mg Oral Q6H PRN Sherlon Handing, NP   650 mg at 06/18/22 0809   albuterol (PROVENTIL) (2.5 MG/3ML) 0.083% nebulizer solution 2.5 mg  2.5 mg Nebulization Q6H PRN Alison Murray, RPH       alum & mag hydroxide-simeth (MAALOX/MYLANTA) 200-200-20 MG/5ML suspension 30 mL  30 mL Oral Q4H PRN Waldon Merl F, NP       apixaban (ELIQUIS) tablet 5 mg  5 mg Oral BID Gerturde Kuba T, MD   5 mg at 06/18/22 0846   clonazePAM (KLONOPIN) disintegrating tablet 0.25 mg  0.25 mg Oral BID PRN Keeyon Privitera, Madie Reno, MD       cloZAPine (CLOZARIL) tablet 200 mg  200 mg Oral QHS Kassie Keng T, MD   200 mg at 06/17/22 2115   hydrOXYzine (ATARAX) tablet 25 mg  25 mg Oral TID PRN Sherlon Handing, NP   25 mg at 06/17/22 1717   hydrOXYzine (ATARAX) tablet 50 mg  50 mg Oral Q6H PRN Wilhemina Grall T, MD       insulin detemir (LEVEMIR) injection 10 Units  10 Units Subcutaneous BID Alvine Mostafa, Madie Reno, MD   10 Units at 06/18/22 0948   lisinopril (ZESTRIL) tablet 2.5 mg  2.5 mg Oral Daily Jannel Lynne T, MD   2.5 mg at 06/18/22 0809   lithium carbonate (ESKALITH) ER tablet 450 mg  450 mg Oral QHS Aleksia Freiman T, MD   450 mg at 06/17/22 2114   magnesium hydroxide (MILK OF MAGNESIA) suspension 30 mL  30 mL Oral Daily PRN Waldon Merl F, NP       metFORMIN (GLUCOPHAGE) tablet 1,000 mg  1,000 mg Oral BID WC Anitta Tenny T, MD   1,000 mg at 06/18/22 0809   mometasone-formoterol (DULERA) 200-5 MCG/ACT inhaler 2 puff  2 puff Inhalation BID Nikelle Malatesta, Madie Reno, MD   2 puff at 06/18/22 0809   nicotine polacrilex (NICORETTE) gum 2 mg  2 mg Oral Q2H PRN Hermes Wafer T, MD   2 mg at 06/18/22 1102   OLANZapine (ZYPREXA) tablet 20 mg  20 mg Oral QHS Sharad Vaneaton T, MD   20 mg at 06/17/22 2114   propranolol (INDERAL) tablet 20 mg  20 mg Oral QHS Elfego Giammarino T, MD   20 mg at 06/17/22 2115   sertraline (ZOLOFT) tablet 100 mg  100 mg Oral Daily  Willia Genrich T, MD   100 mg at 06/18/22 0809   simvastatin (ZOCOR) tablet 40 mg  40 mg Oral q1800 Jovaun Levene, Madie Reno, MD   40 mg at 06/17/22 1718   traZODone ([place])  tablet 100 mg  100 mg Oral QHS Jed Kutch, [name], MD   100 mg at 06/17/22 2115    Lab Results:  Results for orders placed or performed during the hospital encounter of 06/17/22 (from the past 48 hour(s))  Glucose, capillary     Status: Abnormal   Collection Time: 06/17/22  5:08 PM  Result Value Ref Range   Glucose-Capillary 183 (H) 70 - 99 mg/dL    Comment: Glucose reference range applies only to samples taken after fasting for at least 8 hours.  Glucose, capillary     Status: Abnormal   Collection Time: 06/18/22  9:47 AM  Result Value Ref Range   Glucose-Capillary 115 (H) 70 - 99 mg/dL    Comment: Glucose reference range applies only to samples taken after fasting for at least 8 hours.  CBC with Differential/Platelet     Status: Abnormal   Collection Time: 06/18/22 10:03 AM  Result Value Ref Range   WBC 4.9 4.0 - 10.5 K/uL   RBC 4.17 (L) 4.22 - 5.81 MIL/uL   Hemoglobin 12.7 (L) 13.0 - 17.0 g/dL   HCT 88.4 (L) 16.6 - 06.3 %   MCV 91.8 80.0 - 100.0 fL   MCH 30.5 26.0 - 34.0 pg   MCHC 33.2 30.0 - 36.0 g/dL   RDW 01.6 01.0 - 93.2 %   Platelets 331 150 - 400 K/uL   nRBC 0.0 0.0 - 0.2 %   Neutrophils Relative % 59 %   Neutro Abs 2.9 1.7 - 7.7 K/uL   Lymphocytes Relative 29 %   Lymphs Abs 1.4 0.7 - 4.0 K/uL   Monocytes Relative 11 %   Monocytes Absolute 0.5 0.1 - 1.0 K/uL   Eosinophils Relative 0 %   Eosinophils Absolute 0.0 0.0 - 0.5 K/uL   Basophils Relative 0 %   Basophils Absolute 0.0 0.0 - 0.1 K/uL   Immature Granulocytes 1 %   Abs Immature Granulocytes 0.04 0.00 - 0.07 K/uL    Comment: Performed at [facility], [address] [place], [place] [address]    Blood Alcohol level:  Lab Results  Component Value Date   [facility] <10 06/16/2022   ETH <10 04/26/2022    Metabolic Disorder Labs: Lab Results   Component Value Date   HGBA1C 4.9 04/26/2022   MPG 93.93 04/26/2022   MPG 108 11/12/2020   Lab Results  Component Value Date   PROLACTIN 2.0 (L) 08/31/2016   PROLACTIN 4.8 05/22/2016   Lab Results  Component Value Date   CHOL 163 04/28/2022   TRIG 378 (H) 04/28/2022   HDL 35 (L) 04/28/2022   CHOLHDL 4.7 04/28/2022   VLDL 76 (H) 04/28/2022   LDLCALC 52 04/28/2022   LDLCALC UNABLE TO CALCULATE IF TRIGLYCERIDE OVER 400 mg/dL 22/07/5425    Physical Findings: AIMS: Facial and Oral Movements Muscles of Facial Expression: None, normal Lips and Perioral Area: None, normal Jaw: None, normal Tongue: None, normal,Extremity Movements Upper (arms, wrists, hands, fingers): None, normal Lower (legs, knees, ankles, toes): None, normal, Trunk Movements Neck, shoulders, hips: None, normal, Overall Severity Severity of abnormal movements (highest score from questions above): None, normal Incapacitation due to abnormal movements: None, normal Patient's awareness of abnormal movements (rate only patient's report): No Awareness, Dental Status Current problems with teeth and/or dentures?: No Does patient usually wear dentures?: No  CIWA:    COWS:     Musculoskeletal: Strength & Muscle Tone: within normal limits Gait & Station: normal Patient leans: N/A  Psychiatric Specialty Exam:  Presentation  General Appearance:  Bizarre  Eye Contact: Minimal  Speech: Pressured  Speech Volume: Increased  Handedness: Right   Mood and Affect  Mood: Anxious; Euphoric; Irritable  Affect: Congruent   Thought Process  Thought Processes: Disorganized  Descriptions of Associations:Loose  Orientation:Full (Time, Place and Person)  Thought Content:Illogical  History of Schizophrenia/Schizoaffective disorder:Yes  Duration of Psychotic Symptoms:Greater than six months  Hallucinations:Hallucinations: Visual; Auditory Description of Auditory Hallucinations: Voices, yelling at  him Description of Visual Hallucinations: Did not say  Ideas of Reference:Paranoia  Suicidal Thoughts:Suicidal Thoughts: No  Homicidal Thoughts:Homicidal Thoughts: No   Sensorium  Memory: Immediate Fair; Recent Fair; Remote Fair  Judgment: Poor  Insight: Poor   Executive Functions  Concentration: Poor  Attention Span: Poor  Recall: Poor  Fund of Knowledge: Poor  Language: Poor   Psychomotor Activity  Psychomotor Activity: Psychomotor Activity: Normal   Assets  Assets: Manufacturing systems engineer; Desire for Improvement; Housing; Physical Health; Resilience; Social Support   Sleep  Sleep: Sleep: Fair Number of Hours of Sleep: 6    Physical Exam: Physical Exam Vitals and nursing note reviewed.  Constitutional:      Appearance: Normal appearance.  HENT:     Head: Normocephalic and atraumatic.     Mouth/Throat:     Pharynx: Oropharynx is clear.  Eyes:     Pupils: Pupils are equal, round, and reactive to light.  Cardiovascular:     Rate and Rhythm: Normal rate and regular rhythm.  Pulmonary:     Effort: Pulmonary effort is normal.     Breath sounds: Normal breath sounds.  Abdominal:     General: Abdomen is flat.     Palpations: Abdomen is soft.  Musculoskeletal:        General: Normal range of motion.  Skin:    General: Skin is warm and dry.  Neurological:     General: No focal deficit present.     Mental Status: He is alert. Mental status is at baseline.  Psychiatric:        Attention and Perception: Attention normal.        Mood and Affect: Mood normal.        Speech: Speech normal.        Behavior: Behavior normal.        Thought Content: Thought content is paranoid.        Cognition and Memory: Cognition normal.    Review of Systems  Constitutional: Negative.   HENT: Negative.    Eyes: Negative.   Respiratory: Negative.    Cardiovascular: Negative.   Gastrointestinal: Negative.   Musculoskeletal: Negative.   Skin: Negative.    Neurological: Negative.   Psychiatric/Behavioral:  Negative for depression, hallucinations and suicidal ideas. The patient is nervous/anxious.    Blood pressure 112/69, pulse 67, temperature 98.1 F (36.7 C), temperature source Oral, resp. rate 18, height 5' 10.5" (1.791 m), weight 75.3 kg, SpO2 100 %. Body mass index is 23.48 kg/m.   Treatment Plan Summary: Medication management and Plan no change to medication.  Supportive counseling.  Agreed to add the Gatorade.  Encouraged him to talk with the full treatment team.  Did not make any promises about any change to his group home but expressed understanding of his complaint  Mordecai Rasmussen, MD 06/18/2022, 11:16 AM

## 2022-06-18 NOTE — Progress Notes (Signed)
Pt denies SI/HI/AVH and verbally agrees to approach staff if these become apparent or before harming themselves/others. Rates depression 0/10. Rates anxiety 0/10. Rates pain 3/10. Scheduled medications administered to pt, per MD orders. RN provided support and encouragement to pt. Q15 min safety checks implemented and continued. Pt safe on the unit. RN will continue to monitor and intervene as needed.  06/18/22 0809  Psych Admission Type (Psych Patients Only)  Admission Status Voluntary  Psychosocial Assessment  Patient Complaints None  Eye Contact Fair  Facial Expression Sad  Affect Sad;Depressed  Speech Logical/coherent  Interaction Assertive  Motor Activity Slow  Appearance/Hygiene Unremarkable  Behavior Characteristics Cooperative;Appropriate to situation;Calm  Mood Depressed;Sad;Pleasant;Anxious  Aggressive Behavior  Effect No apparent injury  Thought Process  Coherency WDL  Content WDL  Delusions None reported or observed  Perception WDL  Hallucination None reported or observed  Judgment Impaired  Confusion None  Danger to Self  Current suicidal ideation? Denies  Danger to Others  Danger to Others None reported or observed  Danger to Others Abnormal  Harmful Behavior to others No threats or harm toward other people  Destructive Behavior No threats or harm toward property

## 2022-06-18 NOTE — Progress Notes (Signed)
Pharmacy Consult - Clozapine     47 yo M ordered clozapine 200 mg PO QHS  This patient's order has been reviewed for prescribing contraindications.    Clozapine [facility] enrollment Verified: yes on 06/17/22 [facility] patient ID: [id number] Current Outpatient Monitoring: Monthly    Home Regimen:  200 mg PO QHS Last dose: 06/17/22   Dose Adjustments This Admission: None   Labs: Date    ANC    Submitted? 1/4 2900 Yes       Plan: Continue clozapine at current dose Monitor ANC at least weekly while inpatient

## 2022-06-18 NOTE — Progress Notes (Signed)
Initial Nutrition Assessment  DOCUMENTATION CODES:   Not applicable  INTERVENTION:   Ensure Max protein supplement BID, each supplement provides 150kcal and 30g of protein.  MVI po daily   NUTRITION DIAGNOSIS:   Unintentional weight loss related to social / environmental circumstances as evidenced by 17 percent weight loss in 6 months.  GOAL:   Patient will meet greater than or equal to 90% of their needs  MONITOR:   PO intake, Supplement acceptance  REASON FOR ASSESSMENT:   Malnutrition Screening Tool    ASSESSMENT:   47 y/o male with PMHx of DM type 2, HTN, HLD, depression, substance abuse, schizophrenia and pulmonary thromboembolism who is admitted voluntarily for hallucinations.  Pt is known to nutrition department from previous admissions. Pt reports weight loss pta. Per chart, pt is down 34lbs(17%) since June; this is significant weight loss. Pt documented to be eating fairly well in hospital. RD will add supplements and MVI to help pt meet his estimated needs.   Medications reviewed and include: insulin, lithium, metformin  Labs reviewed:   Diet Order:   Diet Order             Diet regular Room service appropriate? Yes; Fluid consistency: Thin  Diet effective now                  EDUCATION NEEDS:   No education needs have been identified at this time  Skin:  Skin Assessment: Reviewed RN Assessment  Last BM:  1/3  Height:   Ht Readings from Last 1 Encounters:  06/17/22 5' 10.5" (1.791 m)    Weight:   Wt Readings from Last 1 Encounters:  06/17/22 75.3 kg   BMI:  Body mass index is 23.48 kg/m.  Estimated Nutritional Needs:   Kcal:  2200-2500kcal/day  Protein:  110-125g/day  Fluid:  2.2-2.5L/day  Koleen Distance MS, RD, LDN Please refer to [facility] for RD and/or RD on-call/weekend/after hours pager

## 2022-06-19 DIAGNOSIS — F200 Paranoid schizophrenia: Secondary | ICD-10-CM | POA: Diagnosis not present

## 2022-06-19 LAB — GLUCOSE, CAPILLARY
Glucose-Capillary: 109 mg/dL — ABNORMAL HIGH (ref 70–99)
Glucose-Capillary: 159 mg/dL — ABNORMAL HIGH (ref 70–99)

## 2022-06-19 NOTE — Progress Notes (Signed)
[facility] MD Progress Note  06/19/2022 1:08 PM Lance [name]  MRN:  [id number] Subjective: Follow-up for this well-known patient with schizophrenia.  Patient attended treatment team today.  No specific symptoms discussed.  Staff has noticed that he still shows irritability and at times will ramble about some of his semipsychotic thoughts but he is also able to engage in appropriate specific conversation and is not having active behavior issues although he does get irritable with some patients.  Patient repeated to [place] his desire to be placed in a new group home. Principal Problem: Schizophrenia (HCC) Diagnosis: Principal Problem:   Schizophrenia (HCC)  Total Time spent with patient: 30 minutes  Past Psychiatric History: Past history of schizophrenia and multiple prior hospital stays  Past Medical History:  Past Medical History:  Diagnosis Date   Depression    Diabetes mellitus without complication (HCC)    Hyperlipidemia    Hypertension    Lupus anticoagulant disorder (HCC) 06/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   PE (pulmonary thromboembolism) (HCC)    Schizo affective schizophrenia (HCC)    Supratherapeutic INR 11/19/2016   History reviewed. No pertinent surgical history. Family History:  Family History  Problem Relation Age of Onset   CAD Mother    CAD Sister    Family Psychiatric  History: See previous Social History:  Social History   Substance and Sexual Activity  Alcohol Use Yes   Comment: occassionally     Social History   Substance and Sexual Activity  Drug Use Yes   Types: Marijuana   Comment: Last use 11/29/16    Social History   Socioeconomic History   Marital status: Single    Spouse name: Not on file   Number of children: Not on file   Years of education: Not on file   Highest education level: Not on file  Occupational History   Not on file  Tobacco Use   Smoking status: Every Day    Packs/day: 1.00    Years: 23.00    Total pack years:  23.00    Types: Cigarettes   Smokeless tobacco: Never   Tobacco comments:    will provide material  Substance and Sexual Activity   Alcohol use: Yes    Comment: occassionally   Drug use: Yes    Types: Marijuana    Comment: Last use 11/29/16   Sexual activity: Not Currently    Birth control/protection: None    Comment: occasional marijuana- none recently  Other Topics Concern   Not on file  Social History Narrative   From a group home in [place]   Social Determinants of Health   Financial Resource Strain: Low Risk  (03/20/2021)   Overall Financial Resource Strain (CARDIA)    Difficulty of Paying Living Expenses: Not very hard  Food Insecurity: Food Insecurity Present (06/17/2022)   Hunger Vital Sign    Worried About Running Out of Food in the Last Year: Sometimes true    Ran Out of Food in the Last Year: Sometimes true  Transportation Needs: Unmet Transportation Needs (06/17/2022)   PRAPARE - Transportation    Lack of Transportation (Medical): Yes    Lack of Transportation (Non-Medical): Yes  Physical Activity: Insufficiently Active (03/20/2021)   Exercise Vital Sign    Days of Exercise per Week: 3 days    Minutes of Exercise per Session: 20 min  Stress: No Stress Concern Present (03/20/2021)   [employer] of Occupational Health - Occupational Stress Questionnaire  Feeling of Stress : Only a little  Social Connections: Socially Isolated (03/20/2021)   Social Connection and Isolation Panel [NHANES]    Frequency of Communication with Friends and Family: More than three times a week    Frequency of Social Gatherings with Friends and Family: More than three times a week    Attends Religious Services: Never    Marine scientist or Organizations: No    Attends Music therapist: Never    Marital Status: Never married   Additional Social History:                         Sleep: Fair  Appetite:  Fair  Current Medications: Current  Facility-Administered Medications  Medication Dose Route Frequency Provider Last Rate Last Admin   acetaminophen (TYLENOL) tablet 650 mg  650 mg Oral Q6H PRN Sherlon Handing, NP   650 mg at 06/18/22 0809   albuterol (PROVENTIL) (2.5 MG/3ML) 0.083% nebulizer solution 2.5 mg  2.5 mg Nebulization Q6H PRN Alison Murray, RPH       alum & mag hydroxide-simeth (MAALOX/MYLANTA) 200-200-20 MG/5ML suspension 30 mL  30 mL Oral Q4H PRN Waldon Merl F, NP       apixaban (ELIQUIS) tablet 5 mg  5 mg Oral BID Lorelai Huyser T, MD   5 mg at 06/19/22 0913   clonazePAM (KLONOPIN) disintegrating tablet 0.25 mg  0.25 mg Oral BID PRN Annah Jasko, Madie Reno, MD       cloZAPine (CLOZARIL) tablet 200 mg  200 mg Oral QHS Jazzmon Prindle T, MD   200 mg at 06/18/22 2113   hydrOXYzine (ATARAX) tablet 25 mg  25 mg Oral TID PRN Sherlon Handing, NP   25 mg at 06/17/22 1717   hydrOXYzine (ATARAX) tablet 50 mg  50 mg Oral Q6H PRN Patches Mcdonnell T, MD       insulin detemir (LEVEMIR) injection 10 Units  10 Units Subcutaneous BID Saphire Barnhart, Madie Reno, MD   10 Units at 06/19/22 0913   lisinopril (ZESTRIL) tablet 2.5 mg  2.5 mg Oral Daily Petrice Beedy T, MD   2.5 mg at 06/19/22 0913   lithium carbonate (ESKALITH) ER tablet 450 mg  450 mg Oral QHS Anamaria Dusenbury T, MD   450 mg at 06/18/22 2113   magnesium hydroxide (MILK OF MAGNESIA) suspension 30 mL  30 mL Oral Daily PRN Waldon Merl F, NP       metFORMIN (GLUCOPHAGE) tablet 1,000 mg  1,000 mg Oral BID WC Brenetta Penny T, MD   1,000 mg at 06/19/22 0913   mometasone-formoterol (DULERA) 200-5 MCG/ACT inhaler 2 puff  2 puff Inhalation BID Rylynn Kobs, Madie Reno, MD   2 puff at 06/19/22 0913   multivitamin with minerals tablet 1 tablet  1 tablet Oral Daily Osie Merkin, Madie Reno, MD   1 tablet at 06/19/22 0913   nicotine polacrilex (NICORETTE) gum 2 mg  2 mg Oral Q2H PRN Rayni Nemitz, Madie Reno, MD   2 mg at 06/19/22 0917   OLANZapine (ZYPREXA) tablet 20 mg  20 mg Oral QHS Talina Pleitez T, MD   20 mg at  06/18/22 2113   propranolol (INDERAL) tablet 20 mg  20 mg Oral QHS Satya Buttram T, MD   20 mg at 06/18/22 2113   protein supplement (ENSURE MAX) liquid  11 oz Oral BID Shada Nienaber, Madie Reno, MD   11 oz at 06/19/22 0915   sertraline (ZOLOFT) tablet 100 mg  100  mg Oral Daily Chrisanne Loose T, MD   100 mg at 06/19/22 0913   simvastatin (ZOCOR) tablet 40 mg  40 mg Oral q1800 Stefanny Pieri, [name], MD   40 mg at 06/18/22 1740   traZODone (DESYREL) tablet 100 mg  100 mg Oral QHS Nkechi Linehan, [name], MD   100 mg at 06/18/22 2113    Lab Results:  Results for orders placed or performed during the hospital encounter of 06/17/22 (from the past 48 hour(s))  Glucose, capillary     Status: Abnormal   Collection Time: 06/17/22  5:08 PM  Result Value Ref Range   Glucose-Capillary 183 (H) 70 - 99 mg/dL    Comment: Glucose reference range applies only to samples taken after fasting for at least 8 hours.  Glucose, capillary     Status: Abnormal   Collection Time: 06/18/22  9:47 AM  Result Value Ref Range   Glucose-Capillary 115 (H) 70 - 99 mg/dL    Comment: Glucose reference range applies only to samples taken after fasting for at least 8 hours.  CBC with Differential/Platelet     Status: Abnormal   Collection Time: 06/18/22 10:03 AM  Result Value Ref Range   WBC 4.9 4.0 - 10.5 K/uL   RBC 4.17 (L) 4.22 - 5.81 MIL/uL   Hemoglobin 12.7 (L) 13.0 - 17.0 g/dL   HCT 44.3 (L) 15.4 - 00.8 %   MCV 91.8 80.0 - 100.0 fL   MCH 30.5 26.0 - 34.0 pg   MCHC 33.2 30.0 - 36.0 g/dL   RDW 67.6 19.5 - 09.3 %   Platelets 331 150 - 400 K/uL   nRBC 0.0 0.0 - 0.2 %   Neutrophils Relative % 59 %   Neutro Abs 2.9 1.7 - 7.7 K/uL   Lymphocytes Relative 29 %   Lymphs Abs 1.4 0.7 - 4.0 K/uL   Monocytes Relative 11 %   Monocytes Absolute 0.5 0.1 - 1.0 K/uL   Eosinophils Relative 0 %   Eosinophils Absolute 0.0 0.0 - 0.5 K/uL   Basophils Relative 0 %   Basophils Absolute 0.0 0.0 - 0.1 K/uL   Immature Granulocytes 1 %   Abs Immature  Granulocytes 0.04 0.00 - 0.07 K/uL    Comment: Performed at [facility], [address] [place], [place] [address]  Glucose, capillary     Status: Abnormal   Collection Time: 06/19/22  8:15 AM  Result Value Ref Range   Glucose-Capillary 109 (H) 70 - 99 mg/dL    Comment: Glucose reference range applies only to samples taken after fasting for at least 8 hours.    Blood Alcohol level:  Lab Results  Component Value Date   ETH <10 06/16/2022   ETH <10 04/26/2022    Metabolic Disorder Labs: Lab Results  Component Value Date   HGBA1C 4.9 04/26/2022   MPG 93.93 04/26/2022   MPG 108 11/12/2020   Lab Results  Component Value Date   PROLACTIN 2.0 (L) 08/31/2016   PROLACTIN 4.8 05/22/2016   Lab Results  Component Value Date   CHOL 163 04/28/2022   TRIG 378 (H) 04/28/2022   HDL 35 (L) 04/28/2022   CHOLHDL 4.7 04/28/2022   VLDL 76 (H) 04/28/2022   LDLCALC 52 04/28/2022   LDLCALC UNABLE TO CALCULATE IF TRIGLYCERIDE OVER 400 mg/dL 45/80/9983    Physical Findings: AIMS: Facial and Oral Movements Muscles of Facial Expression: None, normal Lips and Perioral Area: None, normal Jaw: None, normal Tongue: None, normal,Extremity Movements Upper (arms,  wrists, hands, fingers): None, normal Lower (legs, knees, ankles, toes): None, normal, Trunk Movements Neck, shoulders, hips: None, normal, Overall Severity Severity of abnormal movements (highest score from questions above): None, normal Incapacitation due to abnormal movements: None, normal Patient's awareness of abnormal movements (rate only patient's report): No Awareness, Dental Status Current problems with teeth and/or dentures?: No Does patient usually wear dentures?: No  CIWA:    COWS:     Musculoskeletal: Strength & Muscle Tone: within normal limits Gait & Station: normal Patient leans: N/A  Psychiatric Specialty Exam:  Presentation  General Appearance:  Bizarre  Eye  Contact: Minimal  Speech: Pressured  Speech Volume: Increased  Handedness: Right   Mood and Affect  Mood: Anxious; Euphoric; Irritable  Affect: Congruent   Thought Process  Thought Processes: Disorganized  Descriptions of Associations:Loose  Orientation:Full (Time, Place and Person)  Thought Content:Illogical  History of Schizophrenia/Schizoaffective disorder:Yes  Duration of Psychotic Symptoms:Greater than six months  Hallucinations:No data recorded Ideas of Reference:Paranoia  Suicidal Thoughts:No data recorded Homicidal Thoughts:No data recorded  Sensorium  Memory: Immediate Fair; Recent Fair; Remote Fair  Judgment: Poor  Insight: Poor   Executive Functions  Concentration: Poor  Attention Span: Poor  Recall: Poor  Fund of Knowledge: Poor  Language: Poor   Psychomotor Activity  Psychomotor Activity:No data recorded  Assets  Assets: Communication Skills; Desire for Improvement; Housing; Physical Health; Resilience; Social Support   Sleep  Sleep:No data recorded   Physical Exam: Physical Exam Vitals and nursing note reviewed.  Constitutional:      Appearance: Normal appearance.  HENT:     Head: Normocephalic and atraumatic.     Mouth/Throat:     Pharynx: Oropharynx is clear.  Eyes:     Pupils: Pupils are equal, round, and reactive to light.  Cardiovascular:     Rate and Rhythm: Normal rate and regular rhythm.  Pulmonary:     Effort: Pulmonary effort is normal.     Breath sounds: Normal breath sounds.  Abdominal:     General: Abdomen is flat.     Palpations: Abdomen is soft.  Musculoskeletal:        General: Normal range of motion.  Skin:    General: Skin is warm and dry.  Neurological:     General: No focal deficit present.     Mental Status: He is alert. Mental status is at baseline.  Psychiatric:        Attention and Perception: Attention normal.        Mood and Affect: Mood normal.        Speech: Speech  normal.        Behavior: Behavior is cooperative.        Thought Content: Thought content normal.        Cognition and Memory: Cognition normal.    Review of Systems  Constitutional: Negative.   HENT: Negative.    Eyes: Negative.   Respiratory: Negative.    Cardiovascular: Negative.   Gastrointestinal: Negative.   Musculoskeletal: Negative.   Skin: Negative.   Neurological: Negative.   Psychiatric/Behavioral: Negative.     Blood pressure (!) 142/97, pulse 63, temperature 98.2 F (36.8 C), temperature source Oral, resp. rate 18, height 5' 10.5" (1.791 m), weight 75.3 kg, SpO2 100 %. Body mass index is 23.48 kg/m.   Treatment Plan Summary: Medication management and Plan no change in current medication management.  Did not make any promises to him but told him that we could understand why he was upset  about his group home.  ACT team apparently intends to get in touch with him and perhaps can talk to him a little more about any appropriate options.  Continue daily assessment and involvement in groups.  Patient's blood sugars have been running very normal but continues all of his appropriate medicines including that for diabetes for now  Mordecai Rasmussen, MD 06/19/2022, 1:08 PM

## 2022-06-19 NOTE — [facility] Suicide Risk Assessment (Signed)
Lance [name] INPATIENT:  Family/Significant Other Suicide Prevention Education  Suicide Prevention Education:  Education Completed; [name] Harris/guardian [phone]), has been identified by the patient as the family member/significant other with whom the patient will be residing, and identified as the person(s) who will aid the patient in the event of a mental health crisis (suicidal ideations/suicide attempt).  With written consent from the patient, the family member/significant other has been provided the following suicide prevention education, prior to the and/or following the discharge of the patient.  The suicide prevention education provided includes the following: Suicide risk factors Suicide prevention and interventions National Suicide Hotline telephone number [facility] assessment telephone number [facility] Emergency Assistance [place] and/or Residential Mobile Crisis Unit telephone number  Request made of family/significant other to: Remove weapons (e.g., guns, rifles, knives), all items previously/currently identified as safety concern.   Remove drugs/medications (over-the-counter, prescriptions, illicit drugs), all items previously/currently identified as a safety concern.  The family member/significant other verbalizes understanding of the suicide prevention education information provided.  The family member/significant other agrees to remove the items of safety concern listed above.  Kenton Kingfisher confirmed that pt can return to group home upon discharge. She did not authorize team to begin looking for new group home placement. Harris did verbally consent for team to be able to speak with Armen Pickup and his current group/family care home.   Shirl Harris 06/19/2022, 3:59 PM

## 2022-06-19 NOTE — Progress Notes (Signed)
Pt denies SI/HI/AVH and verbally agrees to approach staff if these become apparent or before harming themselves/others. Rates depression 0/10. Rates anxiety 0/10. Rates pain 0/10. Pt paces around the unit. Pt is preoccupied on religion and the "bad things happening in this world." Pt stated the Bible was all lies and rambled on about that. Scheduled medications administered to pt, per MD orders. RN provided support and encouragement to pt. Q15 min safety checks implemented and continued. Pt safe on the unit. RN will continue to monitor and intervene as needed.  06/19/22 0913  Psych Admission Type (Psych Patients Only)  Admission Status Voluntary  Psychosocial Assessment  Patient Complaints None  Eye Contact Fair  Facial Expression Sad  Affect Sad  Speech Logical/coherent  Interaction Assertive  Motor Activity Pacing  Appearance/Hygiene Unremarkable  Behavior Characteristics Appropriate to situation;Cooperative;Calm  Mood Sad;Pleasant  Aggressive Behavior  Effect No apparent injury  Thought Process  Coherency WDL  Content WDL  Delusions None reported or observed  Perception WDL  Hallucination None reported or observed  Judgment Impaired  Confusion None  Danger to Self  Current suicidal ideation? Denies  Danger to Others  Danger to Others None reported or observed

## 2022-06-19 NOTE — Progress Notes (Signed)
Recreation Therapy Notes  INPATIENT RECREATION THERAPY ASSESSMENT  Patient Details Name: Lance [name] MRN: [id number] DOB: 08-15-1975 Today's Date: 06/19/2022       Information Obtained From: Patient  Able to Participate in Assessment/Interview: Yes  Patient Presentation: Responsive  Reason for Admission (Per Patient): Active Symptoms  Patient Stressors:    Coping Skills:   Isolation, Avoidance, Exercise  Leisure Interests (2+):  Music - Play instrument, Music - Listen, Exercise - Walking  Frequency of Recreation/Participation: Monthly  Awareness of Community Resources:  Yes  Community Resources:  Other (Comment) Proofreader)  Current Use: Yes  If no, Barriers?:    Expressed Interest in [employer] Information: Yes  County of Residence:  [place]  Patient Main Form of Transportation: Other (Comment) (Group home)  Patient Strengths:  Listening  Patient Identified Areas of Improvement:  Finding a new place to go  Patient Goal for Hospitalization:  Rest my body up  Current SI (including self-harm):  No  Current HI:  No  Current AVH: No  Staff Intervention Plan: Group Attendance, Collaborate with Interdisciplinary Treatment Team  Consent to Intern Participation: N/A  Lemoyne Scarpati 06/19/2022, 4:05 PM

## 2022-06-19 NOTE — Progress Notes (Signed)
Recreation Therapy Notes  Date: 06/19/2022  Time: 10:50 am  Location: Craft room     Behavioral response: N/A   Intervention Topic: Life Planning    Discussion/Intervention: Patient refused to attend group.   Clinical Observations/Feedback:  Patient refused to attend group.    Christobal Morado LRT/CTRS        [name] 06/19/2022 1:00 PM

## 2022-06-19 NOTE — Group Note (Signed)
[facility] LCSW Group Therapy Note   Group Date: 06/19/2022 Start Time: 1300 End Time: 1400  Type of Therapy and Topic:  Group Therapy:  Feelings around Relapse and Recovery  Participation Level:  Active    Description of Group:    Patients in this group will discuss emotions they experience before and after a relapse. They will process how experiencing these feelings, or avoidance of experiencing them, relates to having a relapse. Facilitator will guide patients to explore emotions they have related to recovery. Patients will be encouraged to process which emotions are more powerful. They will be guided to discuss the emotional reaction significant others in their lives may have to patients' relapse or recovery. Patients will be assisted in exploring ways to respond to the emotions of others without this contributing to a relapse.  Therapeutic Goals: Patient will identify two or more emotions that lead to relapse for them:  Patient will identify two emotions that result when they relapse:  Patient will identify two emotions related to recovery:  Patient will demonstrate ability to communicate their needs through discussion and/or role plays.   Summary of Patient Progress: Patient was present for the entirety of the group process. He shared his belief that relapse is an inevitable part of recovery. Pt stated that he is trying to keep from returning back to the place that he was living prior to admission. He identified having a treatment team consistently and friends that you know well as protective factors to avoid relapse. Pt comments were religiously focused and all focused on the way others treat him. He was actively involved in the discussion although at times his comments did not have much to do with the discussion. Pt appeared to be open and receptive to comments and feedback from both peers and facilitator.   Therapeutic Modalities:   Cognitive Behavioral Therapy Solution-Focused  Therapy Assertiveness Training Relapse Prevention Therapy   Shirl Harris, LCSW

## 2022-06-19 NOTE — Progress Notes (Signed)
Recreation Therapy Notes  INPATIENT RECREATION TR PLAN  Patient Details Name: Lance [name] MRN: [id number] DOB: 1976-01-21 Today's Date: 06/19/2022  Rec Therapy Plan Is patient appropriate for Therapeutic Recreation?: Yes Treatment times per week: at least 3 Estimated Length of Stay: 5-7 days TR Treatment/Interventions: Group participation (Comment)  Discharge Criteria Pt will be discharged from therapy if:: Discharged Treatment plan/goals/alternatives discussed and agreed upon by:: Patient/family  Discharge Summary     [name] 06/19/2022, 4:05 PM
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
## 2022-06-20 DIAGNOSIS — F200 Paranoid schizophrenia: Secondary | ICD-10-CM | POA: Diagnosis not present
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
## 2022-06-20 NOTE — Plan of Care (Signed)
Pt denies anxiety/depression at this time. Pt denies SI/HI/AVH or pain at this time. Pt is calm and cooperative. Pt is medication compliant. Pt provided with support and encouragement. Pt monitored q15 minutes for safety per unit policy. Plan of care ongoing. Pt can become irritable and walk away if unable to get what he wants when he asks however, de-escalates quickly.   Problem: Education: Goal: Knowledge of General Education information will improve Description: Including pain rating scale, medication(s)/side effects and non-pharmacologic comfort measures Outcome: Progressing   Problem: Coping: Goal: Level of anxiety will decrease Outcome: Progressing

## 2022-06-20 NOTE — Progress Notes (Signed)
No distress noted , he denies SI/HI/AVH interacting appropriately with peers and staff. 15 minutes safety checks maintained will continue to monitor closely/

## 2022-06-21 DIAGNOSIS — F200 Paranoid schizophrenia: Secondary | ICD-10-CM | POA: Diagnosis not present

## 2022-06-21 NOTE — Progress Notes (Signed)
Patient visible in the milieu interacting appropriately with peers and staff, he denies SI/HI/AVH, his thoughts are organized and coherent, he appears less anxious; not pacing, he was offered emotional support and encouragement. Patient is complaint with medication regimen.Patient is safe on the unit, will continue to monitor
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
## 2022-06-21 NOTE — Plan of Care (Signed)
Pt endorses anxiety/depression at this time. Pt denies SI/HI/AVH or pain at this time. Pt is calm and cooperative. Pt is medication compliant. Pt provided with support and encouragement. Pt monitored q15 minutes for safety per unit policy. Plan of care ongoing.   Problem: Education: Goal: Knowledge of General Education information will improve Description: Including pain rating scale, medication(s)/side effects and non-pharmacologic comfort measures Outcome: Progressing   Problem: Coping: Goal: Level of anxiety will decrease Outcome: Not Progressing   

## 2022-06-21 NOTE — Progress Notes (Signed)
   06/21/22 0800  Spiritual Encounters  Type of Visit Follow up  Care provided to: Patient  Conversation partners present during encounter Nurse  Referral source Chaplain assessment  Reason for visit Routine spiritual support   Chaplain gave patient a Bible. He is also requesting a [place] and Chaplain services will locate one and bring it to him. Patient was amicable and Chaplain will follow up.

## 2022-06-22 DIAGNOSIS — F200 Paranoid schizophrenia: Secondary | ICD-10-CM | POA: Diagnosis not present

## 2022-06-22 NOTE — Progress Notes (Signed)
[facility] MD Progress Note  06/22/2022 12:05 PM Lance [name]  MRN:  [id number] Subjective: Follow-up patient with schizophrenia.  No new complaint.  Continues to focus on his insistence that he go to a new group home.  Sometimes irritable especially with other patients but not violent.  Able to hold a generally lucid conversation although he is still preoccupied with some of his odd thinking which seems to be chronic Principal Problem: Schizophrenia (HCC) Diagnosis: Principal Problem:   Schizophrenia (HCC)  Total Time spent with patient: 30 minutes  Past Psychiatric History: Past history of schizophrenia  Past Medical History:  Past Medical History:  Diagnosis Date   Depression    Diabetes mellitus without complication (HCC)    Hyperlipidemia    Hypertension    Lupus anticoagulant disorder (HCC) 06/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   PE (pulmonary thromboembolism) (HCC)    Schizo affective schizophrenia (HCC)    Supratherapeutic INR 11/19/2016   History reviewed. No pertinent surgical history. Family History:  Family History  Problem Relation Age of Onset   CAD Mother    CAD Sister    Family Psychiatric  History: See previous Social History:  Social History   Substance and Sexual Activity  Alcohol Use Yes   Comment: occassionally     Social History   Substance and Sexual Activity  Drug Use Yes   Types: Marijuana   Comment: Last use 11/29/16    Social History   Socioeconomic History   Marital status: Single    Spouse name: Not on file   Number of children: Not on file   Years of education: Not on file   Highest education level: Not on file  Occupational History   Not on file  Tobacco Use   Smoking status: Every Day    Packs/day: 1.00    Years: 23.00    Total pack years: 23.00    Types: Cigarettes   Smokeless tobacco: Never   Tobacco comments:    will provide material  Substance and Sexual Activity   Alcohol use: Yes    Comment: occassionally    Drug use: Yes    Types: Marijuana    Comment: Last use 11/29/16   Sexual activity: Not Currently    Birth control/protection: None    Comment: occasional marijuana- none recently  Other Topics Concern   Not on file  Social History Narrative   From a group home in [place]   Social Determinants of Health   Financial Resource Strain: Low Risk  (03/20/2021)   Overall Financial Resource Strain (CARDIA)    Difficulty of Paying Living Expenses: Not very hard  Food Insecurity: Food Insecurity Present (06/17/2022)   Hunger Vital Sign    Worried About Running Out of Food in the Last Year: Sometimes true    Ran Out of Food in the Last Year: Sometimes true  Transportation Needs: Unmet Transportation Needs (06/17/2022)   PRAPARE - Transportation    Lack of Transportation (Medical): Yes    Lack of Transportation (Non-Medical): Yes  Physical Activity: Insufficiently Active (03/20/2021)   Exercise Vital Sign    Days of Exercise per Week: 3 days    Minutes of Exercise per Session: 20 min  Stress: No Stress Concern Present (03/20/2021)   [employer] of Occupational Health - Occupational Stress Questionnaire    Feeling of Stress : Only a little  Social Connections: Socially Isolated (03/20/2021)   Social Connection and Isolation Panel [NHANES]    Frequency  of Communication with Friends and Family: More than three times a week    Frequency of Social Gatherings with Friends and Family: More than three times a week    Attends Religious Services: Never    Marine scientist or Organizations: No    Attends Music therapist: Never    Marital Status: Never married   Additional Social History:                         Sleep: Fair  Appetite:  Fair  Current Medications: Current Facility-Administered Medications  Medication Dose Route Frequency Provider Last Rate Last Admin   acetaminophen (TYLENOL) tablet 650 mg  650 mg Oral Q6H PRN Sherlon Handing, NP   650 mg at  06/20/22 1736   albuterol (PROVENTIL) (2.5 MG/3ML) 0.083% nebulizer solution 2.5 mg  2.5 mg Nebulization Q6H PRN Alison Murray, RPH       alum & mag hydroxide-simeth (MAALOX/MYLANTA) 200-200-20 MG/5ML suspension 30 mL  30 mL Oral Q4H PRN Waldon Merl F, NP       apixaban (ELIQUIS) tablet 5 mg  5 mg Oral BID Tilford Deaton T, MD   5 mg at 06/22/22 0859   clonazePAM (KLONOPIN) disintegrating tablet 0.25 mg  0.25 mg Oral BID PRN Djon Tith T, MD   0.25 mg at 06/19/22 1608   cloZAPine (CLOZARIL) tablet 200 mg  200 mg Oral QHS Katty Fretwell T, MD   200 mg at 06/21/22 2121   hydrOXYzine (ATARAX) tablet 25 mg  25 mg Oral TID PRN Sherlon Handing, NP   25 mg at 06/17/22 1717   hydrOXYzine (ATARAX) tablet 50 mg  50 mg Oral Q6H PRN Helem Reesor T, MD   50 mg at 06/21/22 2121   insulin detemir (LEVEMIR) injection 10 Units  10 Units Subcutaneous BID Jillian Warth T, MD   10 Units at 06/22/22 1016   lisinopril (ZESTRIL) tablet 2.5 mg  2.5 mg Oral Daily Charleen Madera T, MD   2.5 mg at 06/22/22 0859   lithium carbonate (ESKALITH) ER tablet 450 mg  450 mg Oral QHS Taedyn Glasscock T, MD   450 mg at 06/21/22 2121   magnesium hydroxide (MILK OF MAGNESIA) suspension 30 mL  30 mL Oral Daily PRN Waldon Merl F, NP       metFORMIN (GLUCOPHAGE) tablet 1,000 mg  1,000 mg Oral BID WC Mattix Imhof T, MD   1,000 mg at 06/22/22 0859   mometasone-formoterol (DULERA) 200-5 MCG/ACT inhaler 2 puff  2 puff Inhalation BID Kalana Yust, Madie Reno, MD   2 puff at 06/22/22 0900   multivitamin with minerals tablet 1 tablet  1 tablet Oral Daily Damarie Schoolfield, Madie Reno, MD   1 tablet at 06/22/22 0859   nicotine polacrilex (NICORETTE) gum 2 mg  2 mg Oral Q2H PRN Dhyana Bastone, Madie Reno, MD   2 mg at 06/22/22 0858   OLANZapine (ZYPREXA) tablet 20 mg  20 mg Oral QHS Ankith Edmonston T, MD   20 mg at 06/21/22 2123   propranolol (INDERAL) tablet 20 mg  20 mg Oral QHS Derico Mitton T, MD   20 mg at 06/21/22 2121   protein supplement (ENSURE MAX) liquid  11  oz Oral BID Josy Peaden T, MD   11 oz at 06/22/22 0900   sertraline (ZOLOFT) tablet 100 mg  100 mg Oral Daily Jekhi Bolin, Madie Reno, MD   100 mg at 06/22/22 0859   simvastatin (ZOCOR) tablet  40 mg  40 mg Oral q1800 Shad Ledvina, [name], MD   40 mg at 06/21/22 1732   traZODone (DESYREL) tablet 100 mg  100 mg Oral QHS Ai Sonnenfeld, [name], MD   100 mg at 06/21/22 2122    Lab Results: No results found for this or any previous visit (from the past 48 hour(s)).  Blood Alcohol level:  Lab Results  Component Value Date   ETH <10 06/16/2022   ETH <10 04/26/2022    Metabolic Disorder Labs: Lab Results  Component Value Date   HGBA1C 4.9 04/26/2022   MPG 93.93 04/26/2022   MPG 108 11/12/2020   Lab Results  Component Value Date   PROLACTIN 2.0 (L) 08/31/2016   PROLACTIN 4.8 05/22/2016   Lab Results  Component Value Date   CHOL 163 04/28/2022   TRIG 378 (H) 04/28/2022   HDL 35 (L) 04/28/2022   CHOLHDL 4.7 04/28/2022   VLDL 76 (H) 04/28/2022   LDLCALC 52 04/28/2022   LDLCALC UNABLE TO CALCULATE IF TRIGLYCERIDE OVER 400 mg/dL 57/32/2025    Physical Findings: AIMS: Facial and Oral Movements Muscles of Facial Expression: None, normal Lips and Perioral Area: None, normal Jaw: None, normal Tongue: None, normal,Extremity Movements Upper (arms, wrists, hands, fingers): None, normal Lower (legs, knees, ankles, toes): None, normal, Trunk Movements Neck, shoulders, hips: None, normal, Overall Severity Severity of abnormal movements (highest score from questions above): None, normal Incapacitation due to abnormal movements: None, normal Patient's awareness of abnormal movements (rate only patient's report): No Awareness, Dental Status Current problems with teeth and/or dentures?: No Does patient usually wear dentures?: No  CIWA:    COWS:     Musculoskeletal: Strength & Muscle Tone: within normal limits Gait & Station: normal Patient leans: N/A  Psychiatric Specialty Exam:  Presentation   General Appearance:  Bizarre  Eye Contact: Minimal  Speech: Pressured  Speech Volume: Increased  Handedness: Right   Mood and Affect  Mood: Anxious; Euphoric; Irritable  Affect: Congruent   Thought Process  Thought Processes: Disorganized  Descriptions of Associations:Loose  Orientation:Full (Time, Place and Person)  Thought Content:Illogical  History of Schizophrenia/Schizoaffective disorder:Yes  Duration of Psychotic Symptoms:Greater than six months  Hallucinations:No data recorded Ideas of Reference:Paranoia  Suicidal Thoughts:No data recorded Homicidal Thoughts:No data recorded  Sensorium  Memory: Immediate Fair; Recent Fair; Remote Fair  Judgment: Poor  Insight: Poor   Executive Functions  Concentration: Poor  Attention Span: Poor  Recall: Poor  Fund of Knowledge: Poor  Language: Poor   Psychomotor Activity  Psychomotor Activity:No data recorded  Assets  Assets: Communication Skills; Desire for Improvement; Housing; Physical Health; Resilience; Social Support   Sleep  Sleep:No data recorded   Physical Exam: Physical Exam Vitals and nursing note reviewed.  Constitutional:      Appearance: Normal appearance.  HENT:     Head: Normocephalic and atraumatic.     Mouth/Throat:     Pharynx: Oropharynx is clear.  Eyes:     Pupils: Pupils are equal, round, and reactive to light.  Cardiovascular:     Rate and Rhythm: Normal rate and regular rhythm.  Pulmonary:     Effort: Pulmonary effort is normal.     Breath sounds: Normal breath sounds.  Abdominal:     General: Abdomen is flat.     Palpations: Abdomen is soft.  Musculoskeletal:        General: Normal range of motion.  Skin:    General: Skin is warm and dry.  Neurological:  General: No focal deficit present.     Mental Status: He is alert. Mental status is at baseline.  Psychiatric:        Attention and Perception: Attention normal.        Mood and  Affect: Mood normal. Affect is blunt.        Speech: Speech normal.        Behavior: Behavior is cooperative.        Thought Content: Thought content normal.        Cognition and Memory: Cognition is impaired.    Review of Systems  Constitutional: Negative.   HENT: Negative.    Eyes: Negative.   Respiratory: Negative.    Cardiovascular: Negative.   Gastrointestinal: Negative.   Musculoskeletal: Negative.   Skin: Negative.   Neurological: Negative.   Psychiatric/Behavioral: Negative.     Blood pressure 125/65, pulse (!) 58, temperature 97.6 F (36.4 C), resp. rate 18, height 5' 10.5" (1.791 m), weight 75.3 kg, SpO2 98 %. Body mass index is 23.48 kg/m.   Treatment Plan Summary: Medication management and Plan no change to medication.  He will talk with his ACT team in the next couple days.  We need to start working on some kind of discharge plan.  He continues to insist he needs a group home change which may not be possible.  Mordecai Rasmussen, MD 06/22/2022, 12:05 PM

## 2022-06-22 NOTE — [facility] Suicide Risk Assessment (Signed)
[place] INPATIENT:  Family/Significant Other Suicide Prevention Education  Suicide Prevention Education:  Education Completed; Timoteo Ace, guardian, [phone] has been identified by the patient as the family member/significant other with whom the patient will be residing, and identified as the person(s) who will aid the patient in the event of a mental health crisis (suicidal ideations/suicide attempt).  With written consent from the patient, the family member/significant other has been provided the following suicide prevention education, prior to the and/or following the discharge of the patient.  The suicide prevention education provided includes the following: Suicide risk factors Suicide prevention and interventions National Suicide Hotline telephone number [facility] assessment telephone number [facility] [place] and/or Residential Mobile Crisis Unit telephone number  Request made of family/significant other to: Remove weapons (e.g., guns, rifles, knives), all items previously/currently identified as safety concern.   Remove drugs/medications (over-the-counter, prescriptions, illicit drugs), all items previously/currently identified as a safety concern.  The family member/significant other verbalizes understanding of the suicide prevention education information provided.  The family member/significant other agrees to remove the items of safety concern listed above.  Guardian reports that she doesn't know why the patient has been hospitalized.  She reports that patient does not have access to weapons at the [facility].  She does not believe that the patient is a danger to self to others.  She reports that she is okay with the ACTT team meeting with patient.  She reports that patient can return to the group home to her knowledge.  Reports that the group home is [facility] [place], [phone].   Rozann Lesches 06/22/2022, 3:23 PM

## 2022-06-22 NOTE — Plan of Care (Signed)
D- Patient alert and oriented. Patient presented in a pleasant mood on assessment stating that he slept "alright" last night and had no complaints to voice to this writer at this time. Patient endorsed both depression and anxiety, stating "I don't know, too many questions, I guess". Patient denied SI, HI, AVH, and pain at this time, stating "no, not right now". Patient had no stated goals for today.  A- Scheduled medications administered to patient, per MD orders. Support and encouragement provided.  Routine safety checks conducted every 15 minutes.  Patient informed to notify staff with problems or concerns.  R- No adverse drug reactions noted. Patient contracts for safety at this time. Patient compliant with medications and treatment plan. Patient receptive, calm, and cooperative. Patient remains safe at this time.  Problem: Education: Goal: Knowledge of General Education information will improve Description: Including pain rating scale, medication(s)/side effects and non-pharmacologic comfort measures Outcome: Progressing   Problem: Health Behavior/Discharge Planning: Goal: Ability to manage health-related needs will improve Outcome: Progressing   Problem: Clinical Measurements: Goal: Ability to maintain clinical measurements within normal limits will improve Outcome: Progressing Goal: Will remain free from infection Outcome: Progressing Goal: Diagnostic test results will improve Outcome: Progressing Goal: Respiratory complications will improve Outcome: Progressing Goal: Cardiovascular complication will be avoided Outcome: Progressing   Problem: Activity: Goal: Risk for activity intolerance will decrease Outcome: Progressing   Problem: Nutrition: Goal: Adequate nutrition will be maintained Outcome: Progressing   Problem: Coping: Goal: Level of anxiety will decrease Outcome: Progressing   Problem: Elimination: Goal: Will not experience complications related to bowel  motility Outcome: Progressing Goal: Will not experience complications related to urinary retention Outcome: Progressing   Problem: Pain Managment: Goal: General experience of comfort will improve Outcome: Progressing   Problem: Safety: Goal: Ability to remain free from injury will improve Outcome: Progressing   Problem: Skin Integrity: Goal: Risk for impaired skin integrity will decrease Outcome: Progressing   Problem: Education: Goal: Utilization of techniques to improve thought processes will improve Outcome: Progressing Goal: Knowledge of the prescribed therapeutic regimen will improve Outcome: Progressing   Problem: Activity: Goal: Interest or engagement in leisure activities will improve Outcome: Progressing Goal: Imbalance in normal sleep/wake cycle will improve Outcome: Progressing   Problem: Coping: Goal: Coping ability will improve Outcome: Progressing Goal: Will verbalize feelings Outcome: Progressing   Problem: Health Behavior/Discharge Planning: Goal: Ability to make decisions will improve Outcome: Progressing Goal: Compliance with therapeutic regimen will improve Outcome: Progressing   Problem: Role Relationship: Goal: Will demonstrate positive changes in social behaviors and relationships Outcome: Progressing   Problem: Safety: Goal: Ability to disclose and discuss suicidal ideas will improve Outcome: Progressing Goal: Ability to identify and utilize support systems that promote safety will improve Outcome: Progressing   Problem: Self-Concept: Goal: Will verbalize positive feelings about self Outcome: Progressing Goal: Level of anxiety will decrease Outcome: Progressing   Problem: Education: Goal: Ability to state activities that reduce stress will improve Outcome: Progressing   Problem: Coping: Goal: Ability to identify and develop effective coping behavior will improve Outcome: Progressing   Problem: Self-Concept: Goal: Ability to  identify factors that promote anxiety will improve Outcome: Progressing Goal: Level of anxiety will decrease Outcome: Progressing Goal: Ability to modify response to factors that promote anxiety will improve Outcome: Progressing   Problem: Education: Goal: Knowledge of [place] [facility] Education information/materials will improve Outcome: Progressing Goal: Emotional status will improve Outcome: Progressing Goal: Mental status will improve Outcome: Progressing Goal: Verbalization of understanding the information  provided will improve Outcome: Progressing   Problem: Activity: Goal: Interest or engagement in activities will improve Outcome: Progressing Goal: Sleeping patterns will improve Outcome: Progressing   Problem: Coping: Goal: Ability to verbalize frustrations and anger appropriately will improve Outcome: Progressing Goal: Ability to demonstrate self-control will improve Outcome: Progressing   Problem: Health Behavior/Discharge Planning: Goal: Identification of resources available to assist in meeting health care needs will improve Outcome: Progressing Goal: Compliance with treatment plan for underlying cause of condition will improve Outcome: Progressing   Problem: Physical Regulation: Goal: Ability to maintain clinical measurements within normal limits will improve Outcome: Progressing   Problem: Safety: Goal: Periods of time without injury will increase Outcome: Progressing

## 2022-06-22 NOTE — Progress Notes (Signed)
Recreation Therapy Notes    Date: 06/22/2022  Time: 10:00 am  Location: Craft room     Behavioral response: N/A   Intervention Topic: Coping Skills   Discussion/Intervention: Patient refused to attend group.   Clinical Observations/Feedback:  Patient refused to attend group.    Ellenor Wisniewski LRT/CTRS          Rebekkah Powless 06/22/2022 1:03 PM

## 2022-06-22 NOTE — Progress Notes (Signed)
Patient alert and oriented x 4, affect is blunted but brightens upon approach, his though are organized and coherent, he denies  SI/HI/AVH, and he is complaint with medication regimen. 15 minutes safety checks maintained will continue to monitor.

## 2022-06-23 DIAGNOSIS — F200 Paranoid schizophrenia: Secondary | ICD-10-CM | POA: Diagnosis not present

## 2022-06-23 NOTE — Progress Notes (Signed)
Patient alert and orient, pleasant and cooperative with care. Isolative to room. Came out for snacks and medications. Requested prn for sleep and anxiety. Given with good relief. Denies any SI, HI, AVh currently. No goals did not interact with peers. Encouragement and support provided. Safety checks maintained. Medications given as prescribed. Pt receptive and remains safe on unit with q 15 min checks.

## 2022-06-23 NOTE — Plan of Care (Signed)
  Problem: Education: Goal: Knowledge of General Education information will improve Description: Including pain rating scale, medication(s)/side effects and non-pharmacologic comfort measures Outcome: Progressing   Problem: Health Behavior/Discharge Planning: Goal: Ability to manage health-related needs will improve Outcome: Progressing   Problem: Clinical Measurements: Goal: Ability to maintain clinical measurements within normal limits will improve Outcome: Progressing Goal: Will remain free from infection Outcome: Progressing Goal: Diagnostic test results will improve Outcome: Progressing Goal: Respiratory complications will improve Outcome: Progressing Goal: Cardiovascular complication will be avoided Outcome: Progressing   Problem: Activity: Goal: Risk for activity intolerance will decrease Outcome: Progressing   Problem: Nutrition: Goal: Adequate nutrition will be maintained Outcome: Progressing   Problem: Coping: Goal: Level of anxiety will decrease Outcome: Progressing   Problem: Elimination: Goal: Will not experience complications related to bowel motility Outcome: Progressing Goal: Will not experience complications related to urinary retention Outcome: Progressing   Problem: Pain Managment: Goal: General experience of comfort will improve Outcome: Progressing   Problem: Safety: Goal: Ability to remain free from injury will improve Outcome: Progressing   Problem: Skin Integrity: Goal: Risk for impaired skin integrity will decrease Outcome: Progressing   Problem: Education: Goal: Utilization of techniques to improve thought processes will improve Outcome: Progressing Goal: Knowledge of the prescribed therapeutic regimen will improve Outcome: Progressing   Problem: Activity: Goal: Interest or engagement in leisure activities will improve Outcome: Progressing Goal: Imbalance in normal sleep/wake cycle will improve Outcome: Progressing   Problem:  Coping: Goal: Coping ability will improve Outcome: Progressing Goal: Will verbalize feelings Outcome: Progressing   Problem: Health Behavior/Discharge Planning: Goal: Ability to make decisions will improve Outcome: Progressing Goal: Compliance with therapeutic regimen will improve Outcome: Progressing   Problem: Role Relationship: Goal: Will demonstrate positive changes in social behaviors and relationships Outcome: Progressing   Problem: Safety: Goal: Ability to disclose and discuss suicidal ideas will improve Outcome: Progressing Goal: Ability to identify and utilize support systems that promote safety will improve Outcome: Progressing   Problem: Self-Concept: Goal: Will verbalize positive feelings about self Outcome: Progressing Goal: Level of anxiety will decrease Outcome: Progressing   Problem: Education: Goal: Ability to state activities that reduce stress will improve Outcome: Progressing   Problem: Coping: Goal: Ability to identify and develop effective coping behavior will improve Outcome: Progressing   Problem: Self-Concept: Goal: Ability to identify factors that promote anxiety will improve Outcome: Progressing Goal: Level of anxiety will decrease Outcome: Progressing Goal: Ability to modify response to factors that promote anxiety will improve Outcome: Progressing   Problem: Education: Goal: Knowledge of [place] [facility] Education information/materials will improve Outcome: Progressing Goal: Emotional status will improve Outcome: Progressing Goal: Mental status will improve Outcome: Progressing Goal: Verbalization of understanding the information provided will improve Outcome: Progressing   Problem: Activity: Goal: Interest or engagement in activities will improve Outcome: Progressing Goal: Sleeping patterns will improve Outcome: Progressing   Problem: Coping: Goal: Ability to verbalize frustrations and anger appropriately will  improve Outcome: Progressing Goal: Ability to demonstrate self-control will improve Outcome: Progressing   Problem: Health Behavior/Discharge Planning: Goal: Identification of resources available to assist in meeting health care needs will improve Outcome: Progressing Goal: Compliance with treatment plan for underlying cause of condition will improve Outcome: Progressing   Problem: Physical Regulation: Goal: Ability to maintain clinical measurements within normal limits will improve Outcome: Progressing   Problem: Safety: Goal: Periods of time without injury will increase Outcome: Progressing   

## 2022-06-23 NOTE — Plan of Care (Signed)

## 2022-06-23 NOTE — Progress Notes (Signed)
[facility] MD Progress Note  06/23/2022 4:11 PM Lance [name]  MRN:  [id number] Subjective: Patient seen for follow-up.  No real new complaints.  Compliant with medicine.  Sounds like he is starting to get a little bored of being here because he made some statements today suggesting that he might possibly be open to discharge but then immediately repeated all of his concerns about how they had allegedly threatened him. Principal Problem: Schizophrenia (HCC) Diagnosis: Principal Problem:   Schizophrenia (HCC)  Total Time spent with patient: 30 minutes  Past Psychiatric History: Past history of schizophrenia  Past Medical History:  Past Medical History:  Diagnosis Date   Depression    Diabetes mellitus without complication (HCC)    Hyperlipidemia    Hypertension    Lupus anticoagulant disorder (HCC) 06/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   PE (pulmonary thromboembolism) (HCC)    Schizo affective schizophrenia (HCC)    Supratherapeutic INR 11/19/2016   History reviewed. No pertinent surgical history. Family History:  Family History  Problem Relation Age of Onset   CAD Mother    CAD Sister    Family Psychiatric  History: See previous Social History:  Social History   Substance and Sexual Activity  Alcohol Use Yes   Comment: occassionally     Social History   Substance and Sexual Activity  Drug Use Yes   Types: Marijuana   Comment: Last use 11/29/16    Social History   Socioeconomic History   Marital status: Single    Spouse name: Not on file   Number of children: Not on file   Years of education: Not on file   Highest education level: Not on file  Occupational History   Not on file  Tobacco Use   Smoking status: Every Day    Packs/day: 1.00    Years: 23.00    Total pack years: 23.00    Types: Cigarettes   Smokeless tobacco: Never   Tobacco comments:    will provide material  Substance and Sexual Activity   Alcohol use: Yes    Comment: occassionally    Drug use: Yes    Types: Marijuana    Comment: Last use 11/29/16   Sexual activity: Not Currently    Birth control/protection: None    Comment: occasional marijuana- none recently  Other Topics Concern   Not on file  Social History Narrative   From a group home in [place]   Social Determinants of Health   Financial Resource Strain: Low Risk  (03/20/2021)   Overall Financial Resource Strain (CARDIA)    Difficulty of Paying Living Expenses: Not very hard  Food Insecurity: Food Insecurity Present (06/17/2022)   Hunger Vital Sign    Worried About Running Out of Food in the Last Year: Sometimes true    Ran Out of Food in the Last Year: Sometimes true  Transportation Needs: Unmet Transportation Needs (06/17/2022)   PRAPARE - Transportation    Lack of Transportation (Medical): Yes    Lack of Transportation (Non-Medical): Yes  Physical Activity: Insufficiently Active (03/20/2021)   Exercise Vital Sign    Days of Exercise per Week: 3 days    Minutes of Exercise per Session: 20 min  Stress: No Stress Concern Present (03/20/2021)   [employer] of Occupational Health - Occupational Stress Questionnaire    Feeling of Stress : Only a little  Social Connections: Socially Isolated (03/20/2021)   Social Connection and Isolation Panel [NHANES]    Frequency  of Communication with Friends and Family: More than three times a week    Frequency of Social Gatherings with Friends and Family: More than three times a week    Attends Religious Services: Never    Marine scientist or Organizations: No    Attends Music therapist: Never    Marital Status: Never married   Additional Social History:                         Sleep: Fair  Appetite:  Fair  Current Medications: Current Facility-Administered Medications  Medication Dose Route Frequency Provider Last Rate Last Admin   acetaminophen (TYLENOL) tablet 650 mg  650 mg Oral Q6H PRN Sherlon Handing, NP   650 mg at  06/20/22 1736   albuterol (PROVENTIL) (2.5 MG/3ML) 0.083% nebulizer solution 2.5 mg  2.5 mg Nebulization Q6H PRN Alison Murray, RPH       alum & mag hydroxide-simeth (MAALOX/MYLANTA) 200-200-20 MG/5ML suspension 30 mL  30 mL Oral Q4H PRN Waldon Merl F, NP       apixaban (ELIQUIS) tablet 5 mg  5 mg Oral BID Kensington Rios T, MD   5 mg at 06/23/22 1003   clonazePAM (KLONOPIN) disintegrating tablet 0.25 mg  0.25 mg Oral BID PRN Shantara Goosby T, MD   0.25 mg at 06/19/22 1608   cloZAPine (CLOZARIL) tablet 200 mg  200 mg Oral QHS Landon Bassford T, MD   200 mg at 06/22/22 2150   hydrOXYzine (ATARAX) tablet 25 mg  25 mg Oral TID PRN Sherlon Handing, NP   25 mg at 06/17/22 1717   hydrOXYzine (ATARAX) tablet 50 mg  50 mg Oral Q6H PRN Vonetta Foulk T, MD   50 mg at 06/22/22 2154   insulin detemir (LEVEMIR) injection 10 Units  10 Units Subcutaneous BID Chele Cornell T, MD   10 Units at 06/23/22 1003   lisinopril (ZESTRIL) tablet 2.5 mg  2.5 mg Oral Daily Leasia Swann T, MD   2.5 mg at 06/23/22 1001   lithium carbonate (ESKALITH) ER tablet 450 mg  450 mg Oral QHS Niang Mitcheltree T, MD   450 mg at 06/22/22 2150   magnesium hydroxide (MILK OF MAGNESIA) suspension 30 mL  30 mL Oral Daily PRN Waldon Merl F, NP       metFORMIN (GLUCOPHAGE) tablet 1,000 mg  1,000 mg Oral BID WC Joab Carden T, MD   1,000 mg at 06/23/22 1000   mometasone-formoterol (DULERA) 200-5 MCG/ACT inhaler 2 puff  2 puff Inhalation BID Jarrah Babich, Madie Reno, MD   2 puff at 06/23/22 1000   multivitamin with minerals tablet 1 tablet  1 tablet Oral Daily Metha Kolasa, Madie Reno, MD   1 tablet at 06/23/22 1000   nicotine polacrilex (NICORETTE) gum 2 mg  2 mg Oral Q2H PRN Saxon Barich, Madie Reno, MD   2 mg at 06/23/22 1305   OLANZapine (ZYPREXA) tablet 20 mg  20 mg Oral QHS Vinetta Brach T, MD   20 mg at 06/22/22 2150   propranolol (INDERAL) tablet 20 mg  20 mg Oral QHS Renso Swett T, MD   20 mg at 06/22/22 2150   protein supplement (ENSURE MAX) liquid  11  oz Oral BID Delynn Olvera T, MD   237 mL at 06/23/22 1002   sertraline (ZOLOFT) tablet 100 mg  100 mg Oral Daily Ellenore Roscoe T, MD   100 mg at 06/23/22 1001   simvastatin (ZOCOR) tablet  40 mg  40 mg Oral q1800 Henri Guedes, Madie Reno, MD   40 mg at 06/22/22 1714   traZODone (DESYREL) tablet 100 mg  100 mg Oral QHS Kaci Freel, Madie Reno, MD   100 mg at 06/22/22 2150    Lab Results: No results found for this or any previous visit (from the past 48 hour(s)).  Blood Alcohol level:  Lab Results  Component Value Date   ETH <10 06/16/2022   ETH <10 [id number]    Metabolic Disorder Labs: Lab Results  Component Value Date   HGBA1C 4.9 04/26/2022   MPG 93.93 04/26/2022   MPG 108 11/12/2020   Lab Results  Component Value Date   PROLACTIN 2.0 (L) 08/31/2016   PROLACTIN 4.8 05/22/2016   Lab Results  Component Value Date   CHOL 163 04/28/2022   TRIG 378 (H) 04/28/2022   HDL 35 (L) 04/28/2022   CHOLHDL 4.7 04/28/2022   VLDL 76 (H) 04/28/2022   LDLCALC 52 04/28/2022   LDLCALC UNABLE TO CALCULATE IF TRIGLYCERIDE OVER 400 mg/dL 09/20/2020    Physical Findings: AIMS: Facial and Oral Movements Muscles of Facial Expression: None, normal Lips and Perioral Area: None, normal Jaw: None, normal Tongue: None, normal,Extremity Movements Upper (arms, wrists, hands, fingers): None, normal Lower (legs, knees, ankles, toes): None, normal, Trunk Movements Neck, shoulders, hips: None, normal, Overall Severity Severity of abnormal movements (highest score from questions above): None, normal Incapacitation due to abnormal movements: None, normal Patient's awareness of abnormal movements (rate only patient's report): No Awareness, Dental Status Current problems with teeth and/or dentures?: No Does patient usually wear dentures?: No  CIWA:    COWS:     Musculoskeletal: Strength & Muscle Tone: within normal limits Gait & Station: normal Patient leans: N/A  Psychiatric Specialty Exam:  Presentation   General Appearance:  Bizarre  Eye Contact: Minimal  Speech: Pressured  Speech Volume: Increased  Handedness: Right   Mood and Affect  Mood: Anxious; Euphoric; Irritable  Affect: Congruent   Thought Process  Thought Processes: Disorganized  Descriptions of Associations:Loose  Orientation:Full (Time, Place and Person)  Thought Content:Illogical  History of Schizophrenia/Schizoaffective disorder:Yes  Duration of Psychotic Symptoms:Greater than six months  Hallucinations:No data recorded Ideas of Reference:Paranoia  Suicidal Thoughts:No data recorded Homicidal Thoughts:No data recorded  Sensorium  Memory: Immediate Fair; Recent Fair; Remote Fair  Judgment: Poor  Insight: Poor   Executive Functions  Concentration: Poor  Attention Span: Poor  Recall: Poor  Fund of Knowledge: Poor  Language: Poor   Psychomotor Activity  Psychomotor Activity:No data recorded  Assets  Assets: Communication Skills; Desire for Improvement; Housing; Physical Health; Resilience; Social Support   Sleep  Sleep:No data recorded   Physical Exam: Physical Exam Vitals and nursing note reviewed.  Constitutional:      Appearance: Normal appearance.  HENT:     Head: Normocephalic and atraumatic.     Mouth/Throat:     Pharynx: Oropharynx is clear.  Eyes:     Pupils: Pupils are equal, round, and reactive to light.  Cardiovascular:     Rate and Rhythm: Normal rate and regular rhythm.  Pulmonary:     Effort: Pulmonary effort is normal.     Breath sounds: Normal breath sounds.  Abdominal:     General: Abdomen is flat.     Palpations: Abdomen is soft.  Musculoskeletal:        General: Normal range of motion.  Skin:    General: Skin is warm and dry.  Neurological:  General: No focal deficit present.     Mental Status: He is alert. Mental status is at baseline.  Psychiatric:        Attention and Perception: Attention normal.        Mood and  Affect: Mood normal.        Speech: Speech normal.        Behavior: Behavior is withdrawn.        Thought Content: Thought content normal.        Cognition and Memory: Cognition normal.    Review of Systems  Constitutional: Negative.   HENT: Negative.    Eyes: Negative.   Respiratory: Negative.    Cardiovascular: Negative.   Gastrointestinal: Negative.   Musculoskeletal: Negative.   Skin: Negative.   Neurological: Negative.   Psychiatric/Behavioral: Negative.     Blood pressure 115/66, pulse (!) 58, temperature 98.3 F (36.8 C), temperature source Oral, resp. rate 16, height 5' 10.5" (1.791 m), weight 75.3 kg, SpO2 97 %. Body mass index is 23.48 kg/m.   Treatment Plan Summary: Medication management and Plan no change to medication management.  Patient still awaiting as far as I know a meeting with his ACT team.  Hopefully we can look at discharge home within a couple days if he continues to calm down.  Alethia Berthold, MD 06/23/2022, 4:11 PM

## 2022-06-23 NOTE — Group Note (Signed)
[facility] LCSW Group Therapy Note    Group Date: 06/23/2022 Start Time: 1300 End Time: 1400  Type of Therapy and Topic:  Group Therapy:  Overcoming Obstacles  Participation Level:  [facility] PARTICIPATION LEVEL: Active  Mood:  Description of Group:   In this group patients will be encouraged to explore what they see as obstacles to their own wellness and recovery. They will be guided to discuss their thoughts, feelings, and behaviors related to these obstacles. The group will process together ways to cope with barriers, with attention given to specific choices patients can make. Each patient will be challenged to identify changes they are motivated to make in order to overcome their obstacles. This group will be process-oriented, with patients participating in exploration of their own experiences as well as giving and receiving support and challenge from other group members.  Therapeutic Goals: 1. Patient will identify personal and current obstacles as they relate to admission. 2. Patient will identify barriers that currently interfere with their wellness or overcoming obstacles.  3. Patient will identify feelings, thought process and behaviors related to these barriers. 4. Patient will identify two changes they are willing to make to overcome these obstacles:    Summary of Patient Progress Patient attended group.  Patient was off topic and difficult to redirect.  Patient became upset and left group when he wanted a "direct" answer about how to be successful in life "get on top and stay there".  CSW had explained that the answer isn't clear and requires some work.    Therapeutic Modalities:   Cognitive Behavioral Therapy Solution Focused Therapy Motivational Interviewing Relapse Prevention Therapy   Rozann Lesches, LCSW

## 2022-06-23 NOTE — Progress Notes (Addendum)
Patient calm and pleasant during assessment denies SI/HI/AVH. Pt stated that he should be leaving soon and he feels ready. Pt given education, support, and encouragement to be active in his treatment plan. Pt compliant with medication administration per MD orders. Pt being monitored Q 15 minutes for safety per unit protocol, remains safe on the unit

## 2022-06-23 NOTE — Progress Notes (Signed)
D- Patient alert and oriented x 4. Affect flat /mood euthymic. Denies SI/HI/ AVH. He denies pain. "Dr. Weber Cooks said I was going home today; I am ready to go back to the group home- I have to l have to learn some coping techniques".Reviewed plan of treatment with patient, A- Scheduled medications administered to patient, per MD orders. Support and encouragement provided. PRN Nicotine gum given to patient for tobacco withdrawal with fair results.  Routine safety checks conducted every 15 minutes without incident.  Patient informed to notify staff with problems or concerns. R- No adverse drug reactions noted. Patient compliant with medications and treatment plan. Patient receptive, calm, and cooperative. Patient interacts well with others.  He contracts for safety and remains safe on the unit at this time.

## 2022-06-24 DIAGNOSIS — F200 Paranoid schizophrenia: Secondary | ICD-10-CM | POA: Diagnosis not present
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
## 2022-06-24 NOTE — Group Note (Signed)
[facility] LCSW Group Therapy Note   Group Date: 06/24/2022 Start Time: 1300 End Time: 1400   Type of Therapy/Topic:  Group Therapy:  Emotion Regulation  Participation Level:  Active    Description of Group:    The purpose of this group is to assist patients in learning to regulate negative emotions and experience positive emotions. Patients will be guided to discuss ways in which they have been vulnerable to their negative emotions. These vulnerabilities will be juxtaposed with experiences of positive emotions or situations, and patients challenged to use positive emotions to combat negative ones. Special emphasis will be placed on coping with negative emotions in conflict situations, and patients will process healthy conflict resolution skills.  Therapeutic Goals: Patient will identify two positive emotions or experiences to reflect on in order to balance out negative emotions:  Patient will label two or more emotions that they find the most difficult to experience:  Patient will be able to demonstrate positive conflict resolution skills through discussion or role plays:   Summary of Patient Progress: Patient was present for the entirety of the group process. He was actively involved in the discussion. He described emotional regulation as being healthy. Fear and uncertainty were identified as feelings he experiences. Pt spoke about the importance of being self-aware. He shared that it is important to talk about managing emotions because it is healthy to be able to vent, talk to someone who is support and can give you constructive feedback. Pt has some insight into the subject.    Therapeutic Modalities:   Cognitive Behavioral Therapy Feelings Identification Dialectical Behavioral Therapy   Shirl Harris, LCSW

## 2022-06-24 NOTE — Progress Notes (Signed)
Recreation Therapy Notes    Date: 06/24/2022  Time: 10:50 am     Location: Craft room   Behavioral response: Appropriate  Intervention Topic:  Time Management   Discussion/Intervention:  Group content today was focused on time management. The group defined time management and identified healthy ways to manage time. Individuals expressed how much of the 24 hours they use in a day. Patients expressed how much time they use just for themselves personally. The group expressed how they have managed their time in the past. Individuals participated in the intervention "Managing Life" where they had a chance to see how much of the 24 hours they use and where it goes. Clinical Observations/Feedback: Patient came to group and stated that time management is a way to get a lot done.  Individual left group early and did not return.   Taylee Gunnells LRT/CTRS          Wynter Isaacs 06/24/2022 2:24 PM
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
## 2022-06-24 NOTE — [facility] Group Notes (Signed)
[place] Group Notes:  (Nursing/MHT/Case Management/Adjunct)  Date:  06/24/2022  Time:  9:52 PM  Type of Therapy:   Wrap up  Participation Level:  Active  Participation Quality:  Appropriate  Affect:  Appropriate  Cognitive:  Alert  Insight:  Good  Engagement in Group:  Engaged and goal is to get thoughts together  Modes of Intervention:  Support  Summary of Progress/Problems:  Nehemiah Settle 06/24/2022, 9:52 PM

## 2022-06-24 NOTE — Progress Notes (Signed)
D- Patient alert and oriented x 3-4. Affect flat/mood euthymic. Denies SI/ HI/ AVH. States "I guess I am ready for discharge back to the Group home".  A- Scheduled medications administered to patient, per MD orders.PRN nicotine gum administered for nicotine withdrawal. Support and encouragement provided.  Routine safety checks conducted every 15 minutes.  Patient informed to notify staff with problems or concerns. R- No adverse drug reactions noted. Patient compliant with medications and treatment plan. He Contracts for safety and remains safe at this time on the unit.

## 2022-06-25 DIAGNOSIS — F200 Paranoid schizophrenia: Secondary | ICD-10-CM | POA: Diagnosis not present

## 2022-06-25 NOTE — Progress Notes (Signed)
Pt denies SI/HI/AVH but endorsed SI last night and verbally agrees to approach staff if these become apparent or before harming themselves/others. Rates depression 4/10. Rates anxiety 0/10. Rates pain 0/10. Pt has been pacing around the unit. Scheduled medications administered to pt, per MD orders. RN provided support and encouragement to pt. Q15 min safety checks implemented and continued. Pt safe on the unit. RN will continue to monitor and intervene as needed.  06/25/22 0852  Psych Admission Type (Psych Patients Only)  Admission Status Voluntary  Psychosocial Assessment  Patient Complaints Depression  Eye Contact Fair  Facial Expression Sad  Affect Sad  Speech Logical/coherent  Interaction Assertive  Motor Activity Slow;Pacing  Appearance/Hygiene Unremarkable;In scrubs  Behavior Characteristics Cooperative;Appropriate to situation;Calm  Mood Depressed;Pleasant  Aggressive Behavior  Effect No apparent injury  Thought Process  Coherency Circumstantial  Content WDL  Delusions None reported or observed  Perception Hallucinations  Hallucination None reported or observed (Auditory last night)  Judgment Impaired  Confusion None  Danger to Self  Current suicidal ideation? Denies  Danger to Others  Danger to Others None reported or observed  Danger to Others Abnormal  Harmful Behavior to others No threats or harm toward other people

## 2022-06-25 NOTE — Progress Notes (Signed)
[facility] MD Progress Note  06/25/2022 3:57 PM Lance [name]  MRN:  [id number] Subjective: Patient seen and chart reviewed.  This is a patient with schizophrenia following up on his admission.  Patient has no new complaints.  Walks around the ward much of the time looking bored.  Still talks about the same issue of wanting to have a new place to live.  Has not yet spoken with his ACT team. Principal Problem: Schizophrenia (HCC) Diagnosis: Principal Problem:   Schizophrenia (HCC)  Total Time spent with patient: 30 minutes  Past Psychiatric History: Past history of schizophrenia  Past Medical History:  Past Medical History:  Diagnosis Date   Depression    Diabetes mellitus without complication (HCC)    Hyperlipidemia    Hypertension    Lupus anticoagulant disorder (HCC) 06/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   PE (pulmonary thromboembolism) (HCC)    Schizo affective schizophrenia (HCC)    Supratherapeutic INR 11/19/2016   History reviewed. No pertinent surgical history. Family History:  Family History  Problem Relation Age of Onset   CAD Mother    CAD Sister    Family Psychiatric  History: See previous Social History:  Social History   Substance and Sexual Activity  Alcohol Use Yes   Comment: occassionally     Social History   Substance and Sexual Activity  Drug Use Yes   Types: Marijuana   Comment: Last use 11/29/16    Social History   Socioeconomic History   Marital status: Single    Spouse name: Not on file   Number of children: Not on file   Years of education: Not on file   Highest education level: Not on file  Occupational History   Not on file  Tobacco Use   Smoking status: Every Day    Packs/day: 1.00    Years: 23.00    Total pack years: 23.00    Types: Cigarettes   Smokeless tobacco: Never   Tobacco comments:    will provide material  Substance and Sexual Activity   Alcohol use: Yes    Comment: occassionally   Drug use: Yes    Types:  Marijuana    Comment: Last use 11/29/16   Sexual activity: Not Currently    Birth control/protection: None    Comment: occasional marijuana- none recently  Other Topics Concern   Not on file  Social History Narrative   From a group home in [place]   Social Determinants of Health   Financial Resource Strain: Low Risk  (03/20/2021)   Overall Financial Resource Strain (CARDIA)    Difficulty of Paying Living Expenses: Not very hard  Food Insecurity: Food Insecurity Present (06/17/2022)   Hunger Vital Sign    Worried About Running Out of Food in the Last Year: Sometimes true    Ran Out of Food in the Last Year: Sometimes true  Transportation Needs: Unmet Transportation Needs (06/17/2022)   PRAPARE - Transportation    Lack of Transportation (Medical): Yes    Lack of Transportation (Non-Medical): Yes  Physical Activity: Insufficiently Active (03/20/2021)   Exercise Vital Sign    Days of Exercise per Week: 3 days    Minutes of Exercise per Session: 20 min  Stress: No Stress Concern Present (03/20/2021)   [employer] of Occupational Health - Occupational Stress Questionnaire    Feeling of Stress : Only a little  Social Connections: Socially Isolated (03/20/2021)   Social Connection and Isolation Panel [NHANES]  Frequency of Communication with Friends and Family: More than three times a week    Frequency of Social Gatherings with Friends and Family: More than three times a week    Attends Religious Services: Never    Database administrator or Organizations: No    Attends Engineer, structural: Never    Marital Status: Never married   Additional Social History:                         Sleep: Fair  Appetite:  Fair  Current Medications: Current Facility-Administered Medications  Medication Dose Route Frequency Provider Last Rate Last Admin   acetaminophen (TYLENOL) tablet 650 mg  650 mg Oral Q6H PRN Vanetta Mulders, NP   650 mg at 06/20/22 1736   albuterol  (PROVENTIL) (2.5 MG/3ML) 0.083% nebulizer solution 2.5 mg  2.5 mg Nebulization Q6H PRN Manfred Shirts, RPH       alum & mag hydroxide-simeth (MAALOX/MYLANTA) 200-200-20 MG/5ML suspension 30 mL  30 mL Oral Q4H PRN Gabriel Cirri F, NP       apixaban (ELIQUIS) tablet 5 mg  5 mg Oral BID Stefen Juba, [name], MD   5 mg at 06/25/22 3762   clonazePAM (KLONOPIN) disintegrating tablet 0.25 mg  0.25 mg Oral BID PRN Bartlett Enke T, MD   0.25 mg at 06/19/22 1608   cloZAPine (CLOZARIL) tablet 200 mg  200 mg Oral QHS Shayona Hibbitts T, MD   200 mg at 06/24/22 2117   hydrOXYzine (ATARAX) tablet 25 mg  25 mg Oral TID PRN Vanetta Mulders, NP   25 mg at 06/17/22 1717   hydrOXYzine (ATARAX) tablet 50 mg  50 mg Oral Q6H PRN Lateesha Bezold, [name], MD   50 mg at 06/24/22 2119   insulin detemir (LEVEMIR) injection 10 Units  10 Units Subcutaneous BID Corday Wyka, [name], MD   10 Units at 06/25/22 0851   lisinopril (ZESTRIL) tablet 2.5 mg  2.5 mg Oral Daily Derian Dimalanta T, MD   2.5 mg at 06/25/22 0818   lithium carbonate (ESKALITH) ER tablet 450 mg  450 mg Oral QHS Autumnrose Yore T, MD   450 mg at 06/24/22 2116   magnesium hydroxide (MILK OF MAGNESIA) suspension 30 mL  30 mL Oral Daily PRN Gabriel Cirri F, NP       metFORMIN (GLUCOPHAGE) tablet 1,000 mg  1,000 mg Oral BID WC Chrishawna Farina T, MD   1,000 mg at 06/25/22 0818   mometasone-formoterol (DULERA) 200-5 MCG/ACT inhaler 2 puff  2 puff Inhalation BID Revella Shelton, [name], MD   2 puff at 06/25/22 0819   multivitamin with minerals tablet 1 tablet  1 tablet Oral Daily Daiki Dicostanzo, [name], MD   1 tablet at 06/25/22 0818   nicotine polacrilex (NICORETTE) gum 2 mg  2 mg Oral Q2H PRN Jowan Skillin, [name], MD   2 mg at 06/25/22 1138   OLANZapine (ZYPREXA) tablet 20 mg  20 mg Oral QHS Mckena Chern T, MD   20 mg at 06/24/22 2116   propranolol (INDERAL) tablet 20 mg  20 mg Oral QHS Vanden Fawaz T, MD   20 mg at 06/24/22 2117   protein supplement (ENSURE MAX) liquid  11 oz Oral BID Aaira Oestreicher  T, MD   237 mL at 06/25/22 8315   sertraline (ZOLOFT) tablet 100 mg  100 mg Oral Daily Angeleena Dueitt, [name], MD   100 mg at 06/25/22 0818   simvastatin (ZOCOR)  tablet 40 mg  40 mg Oral q1800 Posey Petrik, Madie Reno, MD   40 mg at 06/24/22 1728   traZODone (DESYREL) tablet 100 mg  100 mg Oral QHS Rontrell Moquin, Madie Reno, MD   100 mg at 06/24/22 2117    Lab Results: No results found for this or any previous visit (from the past 48 hour(s)).  Blood Alcohol level:  Lab Results  Component Value Date   ETH <10 06/16/2022   ETH <10 23/55/7322    Metabolic Disorder Labs: Lab Results  Component Value Date   HGBA1C 4.9 04/26/2022   MPG 93.93 04/26/2022   MPG 108 11/12/2020   Lab Results  Component Value Date   PROLACTIN 2.0 (L) 08/31/2016   PROLACTIN 4.8 05/22/2016   Lab Results  Component Value Date   CHOL 163 04/28/2022   TRIG 378 (H) 04/28/2022   HDL 35 (L) 04/28/2022   CHOLHDL 4.7 04/28/2022   VLDL 76 (H) 04/28/2022   LDLCALC 52 04/28/2022   LDLCALC UNABLE TO CALCULATE IF TRIGLYCERIDE OVER 400 mg/dL 09/20/2020    Physical Findings: AIMS: Facial and Oral Movements Muscles of Facial Expression: None, normal Lips and Perioral Area: None, normal Jaw: None, normal Tongue: None, normal,Extremity Movements Upper (arms, wrists, hands, fingers): None, normal Lower (legs, knees, ankles, toes): None, normal, Trunk Movements Neck, shoulders, hips: None, normal, Overall Severity Severity of abnormal movements (highest score from questions above): None, normal Incapacitation due to abnormal movements: None, normal Patient's awareness of abnormal movements (rate only patient's report): No Awareness, Dental Status Current problems with teeth and/or dentures?: No Does patient usually wear dentures?: No  CIWA:    COWS:     Musculoskeletal: Strength & Muscle Tone: within normal limits Gait & Station: normal Patient leans: N/A  Psychiatric Specialty Exam:  Presentation  General Appearance:   Bizarre  Eye Contact: Minimal  Speech: Pressured  Speech Volume: Increased  Handedness: Right   Mood and Affect  Mood: Anxious; Euphoric; Irritable  Affect: Congruent   Thought Process  Thought Processes: Disorganized  Descriptions of Associations:Loose  Orientation:Full (Time, Place and Person)  Thought Content:Illogical  History of Schizophrenia/Schizoaffective disorder:Yes  Duration of Psychotic Symptoms:Greater than six months  Hallucinations:No data recorded Ideas of Reference:Paranoia  Suicidal Thoughts:No data recorded Homicidal Thoughts:No data recorded  Sensorium  Memory: Immediate Fair; Recent Fair; Remote Fair  Judgment: Poor  Insight: Poor   Executive Functions  Concentration: Poor  Attention Span: Poor  Recall: Poor  Fund of Knowledge: Poor  Language: Poor   Psychomotor Activity  Psychomotor Activity:No data recorded  Assets  Assets: Communication Skills; Desire for Improvement; Housing; Physical Health; Resilience; Social Support   Sleep  Sleep:No data recorded   Physical Exam: Physical Exam Vitals and nursing note reviewed.  Constitutional:      Appearance: Normal appearance.  HENT:     Head: Normocephalic and atraumatic.     Mouth/Throat:     Pharynx: Oropharynx is clear.  Eyes:     Pupils: Pupils are equal, round, and reactive to light.  Cardiovascular:     Rate and Rhythm: Normal rate and regular rhythm.  Pulmonary:     Effort: Pulmonary effort is normal.     Breath sounds: Normal breath sounds.  Abdominal:     General: Abdomen is flat.     Palpations: Abdomen is soft.  Musculoskeletal:        General: Normal range of motion.  Skin:    General: Skin is warm and dry.  Neurological:  General: No focal deficit present.     Mental Status: He is alert. Mental status is at baseline.  Psychiatric:        Attention and Perception: Attention normal.        Mood and Affect: Mood normal. Affect  is blunt.        Speech: Speech normal.        Behavior: Behavior normal.        Thought Content: Thought content normal.        Cognition and Memory: Cognition normal.    Review of Systems  Constitutional: Negative.   HENT: Negative.    Eyes: Negative.   Respiratory: Negative.    Cardiovascular: Negative.   Gastrointestinal: Negative.   Musculoskeletal: Negative.   Skin: Negative.   Neurological: Negative.   Psychiatric/Behavioral:  Positive for depression. Negative for hallucinations and suicidal ideas. The patient is nervous/anxious.    Blood pressure 110/67, pulse 70, temperature 97.9 F (36.6 C), temperature source Oral, resp. rate 20, height 5' 10.5" (1.791 m), weight 75.3 kg, SpO2 99 %. Body mass index is 23.48 kg/m.   Treatment Plan Summary: Medication management and Plan no change to medication.  Patient really needs to be focused on discharge.  He refuses however at this point to consider going back to his old group home.  Hoping the ACT team can convince him.  Alethia Berthold, MD 06/25/2022, 3:57 PM

## 2022-06-25 NOTE — Progress Notes (Signed)
Patient calm and pleasant during assessment denies SI/HI/AVH. Pt observed interacting appropriately with staff and peers on the unit. Pt given education, support, and encouragement to be active in his treatment plan. Pt compliant with medication administration per MD orders. Pt being monitored Q 15 minutes for safety per unit protocol, remains safe on the unit

## 2022-06-25 NOTE — [facility] Group Notes (Signed)
[place] Group Notes:  (Nursing/MHT/Case Management/Adjunct)  Date:  06/25/2022  Time:  3:11 PM  Type of Therapy:  Psychoeducational Skills  Participation Level:  Active  Participation Quality:  Appropriate  Affect:  Appropriate  Cognitive:  Appropriate  Insight:  Appropriate  Engagement in Group:  Engaged  Modes of Intervention:  Socialization  Summary of Progress/Problems:  Lance [name] 06/25/2022, 3:11 PM

## 2022-06-25 NOTE — Group Note (Signed)
LCSW Group Therapy Note  Group Date: 06/25/2022 Start Time: 1300 End Time: 1400   Type of Therapy and Topic:  Group Therapy - Healthy vs Unhealthy Coping Skills  Participation Level:  Active   Description of Group The focus of this group was to determine what unhealthy coping techniques typically are used by group members and what healthy coping techniques would be helpful in coping with various problems. Patients were guided in becoming aware of the differences between healthy and unhealthy coping techniques. Patients were asked to identify 2-3 healthy coping skills they would like to learn to use more effectively.  Therapeutic Goals Patients learned that coping is what human beings do all day long to deal with various situations in their lives Patients defined and discussed healthy vs unhealthy coping techniques Patients identified their preferred coping techniques and identified whether these were healthy or unhealthy Patients determined 2-3 healthy coping skills they would like to become more familiar with and use more often. Patients provided support and ideas to each other   Summary of Patient Progress:   Patient was present for entirety of group session. Attempted to participate in group conversation, though patient was often tangential to the topic. Patient exhibits limited incite. Patient was redirected towards the topic of defense mechanisms.   Therapeutic Modalities Cognitive Behavioral Therapy Motivational Interviewing  Larose Kells 06/25/2022  2:21 PM

## 2022-06-25 NOTE — Plan of Care (Signed)
  Problem: Activity: Goal: Risk for activity intolerance will decrease Outcome: Progressing   Problem: Nutrition: Goal: Adequate nutrition will be maintained Outcome: Progressing   Problem: Coping: Goal: Level of anxiety will decrease Outcome: Progressing   Problem: Pain Managment: Goal: General experience of comfort will improve Outcome: Progressing   

## 2022-06-26 DIAGNOSIS — F200 Paranoid schizophrenia: Secondary | ICD-10-CM | POA: Diagnosis not present

## 2022-06-26 LAB — CBC WITH DIFFERENTIAL/PLATELET
Abs Immature Granulocytes: 0.06 10*3/uL (ref 0.00–0.07)
Basophils Absolute: 0 10*3/uL (ref 0.0–0.1)
Basophils Relative: 0 %
Eosinophils Absolute: 0 10*3/uL (ref 0.0–0.5)
Eosinophils Relative: 0 %
HCT: 41.1 % (ref 39.0–52.0)
Hemoglobin: 13.4 g/dL (ref 13.0–17.0)
Immature Granulocytes: 1 %
Lymphocytes Relative: 28 %
Lymphs Abs: 1.7 10*3/uL (ref 0.7–4.0)
MCH: 30.5 pg (ref 26.0–34.0)
MCHC: 32.6 g/dL (ref 30.0–36.0)
MCV: 93.4 fL (ref 80.0–100.0)
Monocytes Absolute: 0.8 10*3/uL (ref 0.1–1.0)
Monocytes Relative: 13 %
Neutro Abs: 3.6 10*3/uL (ref 1.7–7.7)
Neutrophils Relative %: 58 %
Platelets: 256 10*3/uL (ref 150–400)
RBC: 4.4 MIL/uL (ref 4.22–5.81)
RDW: 13.2 % (ref 11.5–15.5)
WBC: 6.1 10*3/uL (ref 4.0–10.5)
nRBC: 0 % (ref 0.0–0.2)

## 2022-06-26 NOTE — Group Note (Signed)
[place] LCSW Group Therapy Note   Group Date: 06/26/2022 Start Time: 1610 End Time: 1415   Type of Therapy/Topic:  Group Therapy:  Emotion Regulation  Participation Level:  Did Not Attend   Mood:  Description of Group:    The purpose of this group is to assist patients in learning to regulate negative emotions and experience positive emotions. Patients will be guided to discuss ways in which they have been vulnerable to their negative emotions. These vulnerabilities will be juxtaposed with experiences of positive emotions or situations, and patients challenged to use positive emotions to combat negative ones. Special emphasis will be placed on coping with negative emotions in conflict situations, and patients will process healthy conflict resolution skills.  Therapeutic Goals: Patient will identify two positive emotions or experiences to reflect on in order to balance out negative emotions:  Patient will label two or more emotions that they find the most difficult to experience:  Patient will be able to demonstrate positive conflict resolution skills through discussion or role plays:   Summary of Patient Progress:   Patient declined to attend group, despite encouragement from this CSW.    Therapeutic Modalities:   Cognitive Behavioral Therapy Feelings Identification Dialectical Behavioral Therapy   Rozann Lesches, LCSW

## 2022-06-26 NOTE — Progress Notes (Signed)
Pt denies SI/HI/AVH but stated "not yet" to SI and verbally agrees to approach staff if these become apparent or before harming themselves/others. Rates depression 4/10. Rates anxiety 0/10. Rates pain 0/10. Pt paces around the unit. Pt stated that there are "multiple universes, one being freddy kruger, Friday the 13th, [place] [employer],..." Pt stated that he wishes the government would pay for people to think. Pt has made delusional comments throughout the day. Scheduled medications administered to pt, per MD orders. RN provided support and encouragement to pt. Q15 min safety checks implemented and continued. Pt safe on the unit. RN will continue to monitor and intervene as needed.  06/26/22 0807  Psych Admission Type (Psych Patients Only)  Admission Status Voluntary  Psychosocial Assessment  Patient Complaints Depression  Eye Contact Fair  Facial Expression Sad  Affect Sad  Speech Logical/coherent  Interaction Assertive  Motor Activity Slow;Pacing  Appearance/Hygiene Unremarkable  Behavior Characteristics Cooperative;Appropriate to situation;Anxious  Mood Depressed;Sad;Pleasant  Aggressive Behavior  Effect No apparent injury  Thought Process  Coherency Circumstantial  Content WDL  Delusions None reported or observed  Perception Hallucinations  Hallucination None reported or observed  Judgment Impaired  Confusion None  Danger to Self  Current suicidal ideation? Denies ("not right now")  Danger to Others  Danger to Others None reported or observed

## 2022-06-26 NOTE — Progress Notes (Signed)
[facility] MD Progress Note  06/26/2022 1:31 PM Lance [name]  MRN:  [id number] Subjective: Follow-up patient with schizophrenia.  Patient has no new complaints.  Still afraid to return to group home thinking they are going to break his neck.  Has not yet spoken to anyone from the ACT team.  Tolerating medicine well. Principal Problem: Schizophrenia ([place]) Diagnosis: Principal Problem:   Schizophrenia ([place])  Total Time spent with patient: 30 minutes  Past Psychiatric History: Past history of schizophrenia and multiple prior hospitalizations  Past Medical History:  Past Medical History:  Diagnosis Date   Depression    Diabetes mellitus without complication ([place])    Hyperlipidemia    Hypertension    Lupus anticoagulant disorder ([place]) 06/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   PE (pulmonary thromboembolism) ([place])    Schizo affective schizophrenia ([place])    Supratherapeutic INR 11/19/2016   History reviewed. No pertinent surgical history. Family History:  Family History  Problem Relation Age of Onset   CAD Mother    CAD Sister    Family Psychiatric  History: No change Social History:  Social History   Substance and Sexual Activity  Alcohol Use Yes   Comment: occassionally     Social History   Substance and Sexual Activity  Drug Use Yes   Types: Marijuana   Comment: Last use 11/29/16    Social History   Socioeconomic History   Marital status: Single    Spouse name: Not on file   Number of children: Not on file   Years of education: Not on file   Highest education level: Not on file  Occupational History   Not on file  Tobacco Use   Smoking status: Every Day    Packs/day: 1.00    Years: 23.00    Total pack years: 23.00    Types: Cigarettes   Smokeless tobacco: Never   Tobacco comments:    will provide material  Substance and Sexual Activity   Alcohol use: Yes    Comment: occassionally   Drug use: Yes    Types: Marijuana    Comment: Last use 11/29/16    Sexual activity: Not Currently    Birth control/protection: None    Comment: occasional marijuana- none recently  Other Topics Concern   Not on file  Social History Narrative   From a group home in [place] Determinants of Health   Financial Resource Strain: Low Risk  (03/20/2021)   Overall Financial Resource Strain (CARDIA)    Difficulty of Paying Living Expenses: Not very hard  Food Insecurity: Food Insecurity Present (06/17/2022)   Hunger Vital Sign    Worried About Running Out of Food in the Last Year: Sometimes true    Ran Out of Food in the Last Year: Sometimes true  Transportation Needs: Unmet Transportation Needs (06/17/2022)   PRAPARE - Transportation    Lack of Transportation (Medical): Yes    Lack of Transportation (Non-Medical): Yes  Physical Activity: Insufficiently Active (03/20/2021)   Exercise Vital Sign    Days of Exercise per Week: 3 days    Minutes of Exercise per Session: 20 min  Stress: No Stress Concern Present (03/20/2021)   [employer] [place]    Feeling of Stress : Only a little  Social Connections: Socially Isolated (03/20/2021)   Social Connection and Isolation Panel [NHANES]    Frequency of Communication with Friends and Family: More than three times a week  Frequency of Social Gatherings with Friends and Family: More than three times a week    Attends Religious Services: Never    Database administrator or Organizations: No    Attends Engineer, structural: Never    Marital Status: Never married   Additional Social History:                         Sleep: Fair  Appetite:  Fair  Current Medications: Current Facility-Administered Medications  Medication Dose Route Frequency Provider Last Rate Last Admin   acetaminophen (TYLENOL) tablet 650 mg  650 mg Oral Q6H PRN Vanetta Mulders, NP   650 mg at 06/20/22 1736   albuterol (PROVENTIL) (2.5 MG/3ML) 0.083% nebulizer  solution 2.5 mg  2.5 mg Nebulization Q6H PRN Manfred Shirts, RPH       alum & mag hydroxide-simeth (MAALOX/MYLANTA) 200-200-20 MG/5ML suspension 30 mL  30 mL Oral Q4H PRN Gabriel Cirri F, NP       apixaban (ELIQUIS) tablet 5 mg  5 mg Oral BID Zamaria Brazzle, [name], MD   5 mg at 06/26/22 5188   clonazePAM (KLONOPIN) disintegrating tablet 0.25 mg  0.25 mg Oral BID PRN Kimiko Common T, MD   0.25 mg at 06/26/22 1116   cloZAPine (CLOZARIL) tablet 200 mg  200 mg Oral QHS Jenine Krisher T, MD   200 mg at 06/25/22 2101   hydrOXYzine (ATARAX) tablet 25 mg  25 mg Oral TID PRN Vanetta Mulders, NP   25 mg at 06/17/22 1717   hydrOXYzine (ATARAX) tablet 50 mg  50 mg Oral Q6H PRN Evolette Pendell T, MD   50 mg at 06/25/22 2100   insulin detemir (LEVEMIR) injection 10 Units  10 Units Subcutaneous BID Tynia Wiers, [name], MD   10 Units at 06/26/22 0809   lisinopril (ZESTRIL) tablet 2.5 mg  2.5 mg Oral Daily Jolisa Intriago T, MD   2.5 mg at 06/26/22 0806   lithium carbonate (ESKALITH) ER tablet 450 mg  450 mg Oral QHS Jayant Kriz T, MD   450 mg at 06/25/22 2101   magnesium hydroxide (MILK OF MAGNESIA) suspension 30 mL  30 mL Oral Daily PRN Gabriel Cirri F, NP       metFORMIN (GLUCOPHAGE) tablet 1,000 mg  1,000 mg Oral BID WC Zelene Barga T, MD   1,000 mg at 06/26/22 0806   mometasone-formoterol (DULERA) 200-5 MCG/ACT inhaler 2 puff  2 puff Inhalation BID Gabby Rackers, [name], MD   2 puff at 06/26/22 0807   multivitamin with minerals tablet 1 tablet  1 tablet Oral Daily Appollonia Klee, [name], MD   1 tablet at 06/26/22 4166   nicotine polacrilex (NICORETTE) gum 2 mg  2 mg Oral Q2H PRN Maverik Foot, [name], MD   2 mg at 06/26/22 1247   OLANZapine (ZYPREXA) tablet 20 mg  20 mg Oral QHS Amberlynn Tempesta T, MD   20 mg at 06/25/22 2100   propranolol (INDERAL) tablet 20 mg  20 mg Oral QHS Markeise Mathews T, MD   20 mg at 06/25/22 2101   protein supplement (ENSURE MAX) liquid  11 oz Oral BID Bonna Steury T, MD   237 mL at 06/26/22 0812    sertraline (ZOLOFT) tablet 100 mg  100 mg Oral Daily Sherline Eberwein T, MD   100 mg at 06/26/22 0806   simvastatin (ZOCOR) tablet 40 mg  40 mg Oral q1800 Mylissa Lambe, [name], MD   40 mg  at 06/25/22 1700   traZODone (DESYREL) tablet 100 mg  100 mg Oral QHS Liley Rake, Madie Reno, MD   100 mg at 06/25/22 2101    Lab Results:  Results for orders placed or performed during the hospital encounter of 06/17/22 (from the past 48 hour(s))  CBC with Differential/Platelet     Status: None   Collection Time: 06/26/22  6:54 AM  Result Value Ref Range   WBC 6.1 4.0 - 10.5 K/uL   RBC 4.40 4.22 - 5.81 MIL/uL   Hemoglobin 13.4 13.0 - 17.0 g/dL   HCT 41.1 39.0 - 52.0 %   MCV 93.4 80.0 - 100.0 fL   MCH 30.5 26.0 - 34.0 pg   MCHC 32.6 30.0 - 36.0 g/dL   RDW 13.2 11.5 - 15.5 %   Platelets 256 150 - 400 K/uL   nRBC 0.0 0.0 - 0.2 %   Neutrophils Relative % 58 %   Neutro Abs 3.6 1.7 - 7.7 K/uL   Lymphocytes Relative 28 %   Lymphs Abs 1.7 0.7 - 4.0 K/uL   Monocytes Relative 13 %   Monocytes Absolute 0.8 0.1 - 1.0 K/uL   Eosinophils Relative 0 %   Eosinophils Absolute 0.0 0.0 - 0.5 K/uL   Basophils Relative 0 %   Basophils Absolute 0.0 0.0 - 0.1 K/uL   Immature Granulocytes 1 %   Abs Immature Granulocytes 0.06 0.00 - 0.07 K/uL    Comment: Performed at [facility], [address] [place], [place] [address]    Blood Alcohol level:  Lab Results  Component Value Date   [facility] <10 06/16/2022   ETH <10 29/93/7169    Metabolic Disorder Labs: Lab Results  Component Value Date   HGBA1C 4.9 04/26/2022   MPG 93.93 04/26/2022   MPG 108 11/12/2020   Lab Results  Component Value Date   PROLACTIN 2.0 (L) 08/31/2016   PROLACTIN 4.8 05/22/2016   Lab Results  Component Value Date   CHOL 163 04/28/2022   TRIG 378 (H) 04/28/2022   HDL 35 (L) 04/28/2022   CHOLHDL 4.7 04/28/2022   VLDL 76 (H) 04/28/2022   LDLCALC 52 04/28/2022   LDLCALC UNABLE TO CALCULATE IF TRIGLYCERIDE OVER 400 mg/dL 09/20/2020     Physical Findings: AIMS: Facial and Oral Movements Muscles of Facial Expression: None, normal Lips and Perioral Area: None, normal Jaw: None, normal Tongue: None, normal,Extremity Movements Upper (arms, wrists, hands, fingers): None, normal Lower (legs, knees, ankles, toes): None, normal, Trunk Movements Neck, shoulders, hips: None, normal, Overall Severity Severity of abnormal movements (highest score from questions above): None, normal Incapacitation due to abnormal movements: None, normal Patient's awareness of abnormal movements (rate only patient's report): No Awareness, Dental Status Current problems with teeth and/or dentures?: No Does patient usually wear dentures?: No  CIWA:    COWS:     Musculoskeletal: Strength & Muscle Tone: within normal limits Gait & Station: normal Patient leans: N/A  Psychiatric Specialty Exam:  Presentation  General Appearance:  Bizarre  Eye Contact: Minimal  Speech: Pressured  Speech Volume: Increased  Handedness: Right   Mood and Affect  Mood: Anxious; Euphoric; Irritable  Affect: Congruent   Thought Process  Thought Processes: Disorganized  Descriptions of Associations:Loose  Orientation:Full (Time, Place and Person)  Thought Content:Illogical  History of Schizophrenia/Schizoaffective disorder:Yes  Duration of Psychotic Symptoms:Greater than six months  Hallucinations:No data recorded Ideas of Reference:Paranoia  Suicidal Thoughts:No data recorded Homicidal Thoughts:No data recorded  Sensorium  Memory: Immediate Fair; Recent Fair;  Remote Fair  Judgment: Poor  Insight: Poor   Executive Functions  Concentration: Poor  Attention Span: Poor  Recall: Poor  Fund of Knowledge: Poor  Language: Poor   Psychomotor Activity  Psychomotor Activity:No data recorded  Assets  Assets: Communication Skills; Desire for Improvement; Housing; Physical Health; Resilience; Social  Support   Sleep  Sleep:No data recorded   Physical Exam: Physical Exam Vitals and nursing note reviewed.  Constitutional:      Appearance: Normal appearance.  HENT:     Head: Normocephalic and atraumatic.     Mouth/Throat:     Pharynx: Oropharynx is clear.  Eyes:     Pupils: Pupils are equal, round, and reactive to light.  Cardiovascular:     Rate and Rhythm: Normal rate and regular rhythm.  Pulmonary:     Effort: Pulmonary effort is normal.     Breath sounds: Normal breath sounds.  Abdominal:     General: Abdomen is flat.     Palpations: Abdomen is soft.  Musculoskeletal:        General: Normal range of motion.  Skin:    General: Skin is warm and dry.  Neurological:     General: No focal deficit present.     Mental Status: He is alert. Mental status is at baseline.  Psychiatric:        Attention and Perception: Attention normal.        Mood and Affect: Mood normal. Affect is blunt.        Speech: Speech normal.        Behavior: Behavior is cooperative.        Thought Content: Thought content is delusional.        Cognition and Memory: Cognition normal.    Review of Systems  Constitutional: Negative.   HENT: Negative.    Eyes: Negative.   Respiratory: Negative.    Cardiovascular: Negative.   Gastrointestinal: Negative.   Musculoskeletal: Negative.   Skin: Negative.   Neurological: Negative.   Psychiatric/Behavioral:  Negative for depression, hallucinations, substance abuse and suicidal ideas. The patient is not nervous/anxious.    Blood pressure 123/67, pulse 64, temperature 98.1 F (36.7 C), temperature source Oral, resp. rate 20, height 5' 10.5" (1.791 m), weight 75.3 kg, SpO2 98 %. Body mass index is 23.48 kg/m.   Treatment Plan Summary: Plan no change to medication.  Supportive counseling.  Gently trying to encourage him to think about going back to his group home.  Still waiting for him to contact the ACT team or them to contact him.  Mordecai Rasmussen,  MD 06/26/2022, 1:31 PM

## 2022-06-26 NOTE — Plan of Care (Signed)
  Problem: Nutrition: Goal: Adequate nutrition will be maintained Outcome: Progressing   Problem: Coping: Goal: Level of anxiety will decrease Outcome: Progressing   Problem: Coping: Goal: Will verbalize feelings Outcome: Progressing   Problem: Coping: Goal: Coping ability will improve Outcome: Not Progressing

## 2022-06-26 NOTE — Progress Notes (Signed)
Pharmacy - Clozapine     This patient's order has been reviewed for prescribing contraindications.   Clozapine REMS enrollment Verified: yes Current Outpatient Monitoring: monthly  Home Regimen: clozapine 200 mg QHS   Dose Adjustments This Admission: clozapine 200 mg QHS   Labs:   06/26/22 ANC 3600   Plan: Continue with weekly ANC labs while inpatient  **The medication is being dispensed pursuant to the FDA REMS suspension order of 05/03/20 that allows for dispensing without a patient REMS dispense authorization (RDA).    Glean Salvo, PharmD, BCPS Clinical Pharmacist  06/26/2022 1:30 PM

## 2022-06-26 NOTE — Progress Notes (Signed)
Recreation Therapy Notes    Date: 06/26/2022  Time: 9:50 am  Location: Craft room     Behavioral response: N/A   Intervention Topic: Problem Solving  Discussion/Intervention: Patient refused to attend group.   Clinical Observations/Feedback:  Patient refused to attend group.    Cali Hope LRT/CTRS        Freja Faro 06/26/2022 12:40 PM 

## 2022-06-27 DIAGNOSIS — F200 Paranoid schizophrenia: Secondary | ICD-10-CM | POA: Diagnosis not present

## 2022-06-27 NOTE — Plan of Care (Signed)
Pt is calm and cooperative; compliant with medications . Denies SI HI. Mildly bizarre behavior and staring at times.  Continued monitoring via 15 minute checks.

## 2022-06-27 NOTE — Progress Notes (Signed)
[facility] MD Progress Note  06/27/2022 12:12 PM Lance [name]  MRN:  [id number] Subjective: Lance [name] is seen on rounds.  He says he has a lot on his mind but the medications are fine.  Nurses report no issues.  He says he has anxiety about the future.  Currently denies any suicidal ideation.  Principal Problem: Schizophrenia ([place]) Diagnosis: Principal Problem:   Schizophrenia ([place])  Total Time spent with patient: 15 minutes  Past Psychiatric History: Past history of schizophrenia and multiple prior hospitalizations   Past Medical History:  Past Medical History:  Diagnosis Date   Depression    Diabetes mellitus without complication ([place])    Hyperlipidemia    Hypertension    Lupus anticoagulant disorder ([place]) 06/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   PE (pulmonary thromboembolism) ([place])    Schizo affective schizophrenia ([place])    Supratherapeutic INR 11/19/2016   History reviewed. No pertinent surgical history. Family History:  Family History  Problem Relation Age of Onset   CAD Mother    CAD Sister     Social History:  Social History   Substance and Sexual Activity  Alcohol Use Yes   Comment: occassionally     Social History   Substance and Sexual Activity  Drug Use Yes   Types: Marijuana   Comment: Last use 11/29/16    Social History   Socioeconomic History   Marital status: Single    Spouse name: Not on file   Number of children: Not on file   Years of education: Not on file   Highest education level: Not on file  Occupational History   Not on file  Tobacco Use   Smoking status: Every Day    Packs/day: 1.00    Years: 23.00    Total pack years: 23.00    Types: Cigarettes   Smokeless tobacco: Never   Tobacco comments:    will provide material  Substance and Sexual Activity   Alcohol use: Yes    Comment: occassionally   Drug use: Yes    Types: Marijuana    Comment: Last use 11/29/16   Sexual activity: Not Currently    Birth control/protection:  None    Comment: occasional marijuana- none recently  Other Topics Concern   Not on file  Social History Narrative   From a group home in [place] Determinants of Health   Financial Resource Strain: Low Risk  (03/20/2021)   Overall Financial Resource Strain (CARDIA)    Difficulty of Paying Living Expenses: Not very hard  Food Insecurity: Food Insecurity Present (06/17/2022)   Hunger Vital Sign    Worried About Running Out of Food in the Last Year: Sometimes true    Ran Out of Food in the Last Year: Sometimes true  Transportation Needs: Unmet Transportation Needs (06/17/2022)   PRAPARE - Transportation    Lack of Transportation (Medical): Yes    Lack of Transportation (Non-Medical): Yes  Physical Activity: Insufficiently Active (03/20/2021)   Exercise Vital Sign    Days of Exercise per Week: 3 days    Minutes of Exercise per Session: 20 min  Stress: No Stress Concern Present (03/20/2021)   [employer] [place]    Feeling of Stress : Only a little  Social Connections: Socially Isolated (03/20/2021)   Social Connection and Isolation Panel [NHANES]    Frequency of Communication with Friends and Family: More than three times a week    Frequency  of Social Gatherings with Friends and Family: More than three times a week    Attends Religious Services: Never    Marine scientist or Organizations: No    Attends Music therapist: Never    Marital Status: Never married   Additional Social History:                         Sleep: Good  Appetite:  Good  Current Medications: Current Facility-Administered Medications  Medication Dose Route Frequency Provider Last Rate Last Admin   acetaminophen (TYLENOL) tablet 650 mg  650 mg Oral Q6H PRN Sherlon Handing, NP   650 mg at 06/20/22 1736   albuterol (PROVENTIL) (2.5 MG/3ML) 0.083% nebulizer solution 2.5 mg  2.5 mg Nebulization Q6H PRN Alison Murray, RPH       alum & mag hydroxide-simeth (MAALOX/MYLANTA) 200-200-20 MG/5ML suspension 30 mL  30 mL Oral Q4H PRN Waldon Merl F, NP       apixaban (ELIQUIS) tablet 5 mg  5 mg Oral BID Clapacs, Madie Reno, MD   5 mg at 06/27/22 2202   clonazePAM (KLONOPIN) disintegrating tablet 0.25 mg  0.25 mg Oral BID PRN Clapacs, [name], MD   0.25 mg at 06/26/22 1116   cloZAPine (CLOZARIL) tablet 200 mg  200 mg Oral QHS Clapacs, [name], MD   200 mg at 06/26/22 2120   hydrOXYzine (ATARAX) tablet 25 mg  25 mg Oral TID PRN Sherlon Handing, NP   25 mg at 06/17/22 1717   hydrOXYzine (ATARAX) tablet 50 mg  50 mg Oral Q6H PRN Clapacs, [name], MD   50 mg at 06/26/22 2121   insulin detemir (LEVEMIR) injection 10 Units  10 Units Subcutaneous BID Clapacs, Madie Reno, MD   10 Units at 06/27/22 0934   lisinopril (ZESTRIL) tablet 2.5 mg  2.5 mg Oral Daily Clapacs, [name], MD   2.5 mg at 06/27/22 0829   lithium carbonate (ESKALITH) ER tablet 450 mg  450 mg Oral QHS Clapacs, [name], MD   450 mg at 06/26/22 2121   magnesium hydroxide (MILK OF MAGNESIA) suspension 30 mL  30 mL Oral Daily PRN Waldon Merl F, NP       metFORMIN (GLUCOPHAGE) tablet 1,000 mg  1,000 mg Oral BID WC Clapacs, [name], MD   1,000 mg at 06/27/22 5427   mometasone-formoterol (DULERA) 200-5 MCG/ACT inhaler 2 puff  2 puff Inhalation BID Clapacs, Madie Reno, MD   2 puff at 06/27/22 0623   multivitamin with minerals tablet 1 tablet  1 tablet Oral Daily Clapacs, Madie Reno, MD   1 tablet at 06/27/22 7628   nicotine polacrilex (NICORETTE) gum 2 mg  2 mg Oral Q2H PRN Clapacs, Madie Reno, MD   2 mg at 06/27/22 0936   OLANZapine (ZYPREXA) tablet 20 mg  20 mg Oral QHS Clapacs, [name], MD   20 mg at 06/26/22 2121   propranolol (INDERAL) tablet 20 mg  20 mg Oral QHS Clapacs, [name], MD   20 mg at 06/26/22 2121   protein supplement (ENSURE MAX) liquid  11 oz Oral BID Clapacs, [name], MD   237 mL at 06/27/22 0936   sertraline (ZOLOFT) tablet 100 mg  100 mg Oral Daily Clapacs, John  T, MD   100 mg at 06/27/22 0829   simvastatin (ZOCOR) tablet 40 mg  40 mg Oral q1800 Clapacs, Madie Reno, MD   40 mg at  06/26/22 1731   traZODone (DESYREL) tablet 100 mg  100 mg Oral QHS Clapacs, Madie Reno, MD   100 mg at 06/26/22 2121    Lab Results:  Results for orders placed or performed during the hospital encounter of 06/17/22 (from the past 48 hour(s))  CBC with Differential/Platelet     Status: None   Collection Time: 06/26/22  6:54 AM  Result Value Ref Range   WBC 6.1 4.0 - 10.5 K/uL   RBC 4.40 4.22 - 5.81 MIL/uL   Hemoglobin 13.4 13.0 - 17.0 g/dL   HCT 41.1 39.0 - 52.0 %   MCV 93.4 80.0 - 100.0 fL   MCH 30.5 26.0 - 34.0 pg   MCHC 32.6 30.0 - 36.0 g/dL   RDW 13.2 11.5 - 15.5 %   Platelets 256 150 - 400 K/uL   nRBC 0.0 0.0 - 0.2 %   Neutrophils Relative % 58 %   Neutro Abs 3.6 1.7 - 7.7 K/uL   Lymphocytes Relative 28 %   Lymphs Abs 1.7 0.7 - 4.0 K/uL   Monocytes Relative 13 %   Monocytes Absolute 0.8 0.1 - 1.0 K/uL   Eosinophils Relative 0 %   Eosinophils Absolute 0.0 0.0 - 0.5 K/uL   Basophils Relative 0 %   Basophils Absolute 0.0 0.0 - 0.1 K/uL   Immature Granulocytes 1 %   Abs Immature Granulocytes 0.06 0.00 - 0.07 K/uL    Comment: Performed at [facility], [address] [place], [place] [address]    Blood Alcohol level:  Lab Results  Component Value Date   [facility] <10 06/16/2022   ETH <10 [id number]    Metabolic Disorder Labs: Lab Results  Component Value Date   HGBA1C 4.9 04/26/2022   MPG 93.93 04/26/2022   MPG 108 11/12/2020   Lab Results  Component Value Date   PROLACTIN 2.0 (L) 08/31/2016   PROLACTIN 4.8 05/22/2016   Lab Results  Component Value Date   CHOL 163 04/28/2022   TRIG 378 (H) 04/28/2022   HDL 35 (L) 04/28/2022   CHOLHDL 4.7 04/28/2022   VLDL 76 (H) 04/28/2022   LDLCALC 52 04/28/2022   LDLCALC UNABLE TO CALCULATE IF TRIGLYCERIDE OVER 400 mg/dL 09/20/2020    Physical Findings: AIMS: Facial and Oral Movements Muscles of  Facial Expression: None, normal Lips and Perioral Area: None, normal Jaw: None, normal Tongue: None, normal,Extremity Movements Upper (arms, wrists, hands, fingers): None, normal Lower (legs, knees, ankles, toes): None, normal, Trunk Movements Neck, shoulders, hips: None, normal, Overall Severity Severity of abnormal movements (highest score from questions above): None, normal Incapacitation due to abnormal movements: None, normal Patient's awareness of abnormal movements (rate only patient's report): No Awareness, Dental Status Current problems with teeth and/or dentures?: No Does patient usually wear dentures?: No  CIWA:    COWS:     Musculoskeletal: Strength & Muscle Tone: within normal limits Gait & Station: normal Patient leans: N/A  Psychiatric Specialty Exam:  Presentation  General Appearance:  Bizarre  Eye Contact: Minimal  Speech: Pressured  Speech Volume: Increased  Handedness: Right   Mood and Affect  Mood: Anxious; Euphoric; Irritable  Affect: Congruent   Thought Process  Thought Processes: Disorganized  Descriptions of Associations:Loose  Orientation:Full (Time, Place and Person)  Thought Content:Illogical  History of Schizophrenia/Schizoaffective disorder:Yes  Duration of Psychotic Symptoms:Greater than six months  Hallucinations:No data recorded Ideas of Reference:Paranoia  Suicidal Thoughts:No data recorded Homicidal Thoughts:No data recorded  Sensorium  Memory: Immediate Fair; Recent Fair; Remote  Fair  Judgment: Poor  Insight: Poor   Executive Functions  Concentration: Poor  Attention Span: Poor  Recall: Poor  Fund of Knowledge: Poor  Language: Poor   Psychomotor Activity  Psychomotor Activity:No data recorded  Assets  Assets: Communication Skills; Desire for Improvement; Housing; Physical Health; Resilience; Social Support   Sleep  Sleep:No data recorded    Blood pressure 114/69, pulse 64,  temperature 97.6 F (36.4 C), temperature source Oral, resp. rate 18, height 5' 10.5" (1.791 m), weight 75.3 kg, SpO2 97 %. Body mass index is 23.48 kg/m.   Treatment Plan Summary: Daily contact with patient to assess and evaluate symptoms and progress in treatment, Medication management, and Plan continue current medications.  Sarina Ill, DO 06/27/2022, 12:12 PM

## 2022-06-27 NOTE — Progress Notes (Signed)
D- Patient alert and oriented x 3-4. Affect flat/mood depressed. Denies SI/HI/AVH. He denies pain at present. He states "I don't want to go back to the same group home and get hurt or hurt somebody". A- Scheduled medications administered to patient, per MD orders. Support and encouragement provided.  Routine safety checks conducted every 15 minutes without incident. Patient informed to notify staff with problems or concerns. R- No adverse drug reactions noted. Patient compliant with medications and treatment plan. Patient receptive, calm, and cooperative. Patient contracts for safety and remains safe on the unit at this time.

## 2022-06-27 NOTE — Plan of Care (Signed)
Patient is calm and cooperative, denies SI HI AVH.  Pt is compliant with medications and active on the unit.  Pt paces and walks frequently and says he "loves walking."  Pt engaged in conversation with this writer about how it would be to be omnipresent and all knowing like God.  Pt reported that the hydroxyzine helps him sleep very well.  Continued monitoring for safety via q 15 minute checks.

## 2022-06-27 NOTE — Plan of Care (Signed)
Problem: Education: Goal: Knowledge of General Education information will improve Description: Including pain rating scale, medication(s)/side effects and non-pharmacologic comfort measures Outcome: Not Progressing   Problem: Health Behavior/Discharge Planning: Goal: Ability to manage health-related needs will improve Outcome: Not Progressing   Problem: Clinical Measurements: Goal: Ability to maintain clinical measurements within normal limits will improve Outcome: Not Progressing Goal: Will remain free from infection Outcome: Not Progressing Goal: Diagnostic test results will improve Outcome: Not Progressing Goal: Respiratory complications will improve Outcome: Not Progressing Goal: Cardiovascular complication will be avoided Outcome: Not Progressing   Problem: Activity: Goal: Risk for activity intolerance will decrease Outcome: Not Progressing   Problem: Nutrition: Goal: Adequate nutrition will be maintained Outcome: Not Progressing   Problem: Coping: Goal: Level of anxiety will decrease Outcome: Not Progressing   Problem: Elimination: Goal: Will not experience complications related to bowel motility Outcome: Not Progressing Goal: Will not experience complications related to urinary retention Outcome: Not Progressing   Problem: Pain Managment: Goal: General experience of comfort will improve Outcome: Not Progressing   Problem: Safety: Goal: Ability to remain free from injury will improve Outcome: Not Progressing   Problem: Skin Integrity: Goal: Risk for impaired skin integrity will decrease Outcome: Not Progressing   Problem: Education: Goal: Utilization of techniques to improve thought processes will improve Outcome: Not Progressing Goal: Knowledge of the prescribed therapeutic regimen will improve Outcome: Not Progressing   Problem: Activity: Goal: Interest or engagement in leisure activities will improve Outcome: Not Progressing Goal: Imbalance in  normal sleep/wake cycle will improve Outcome: Not Progressing   Problem: Coping: Goal: Coping ability will improve Outcome: Not Progressing Goal: Will verbalize feelings Outcome: Not Progressing   Problem: Health Behavior/Discharge Planning: Goal: Ability to make decisions will improve Outcome: Not Progressing Goal: Compliance with therapeutic regimen will improve Outcome: Not Progressing   Problem: Role Relationship: Goal: Will demonstrate positive changes in social behaviors and relationships Outcome: Not Progressing   Problem: Safety: Goal: Ability to disclose and discuss suicidal ideas will improve Outcome: Not Progressing Goal: Ability to identify and utilize support systems that promote safety will improve Outcome: Not Progressing   Problem: Self-Concept: Goal: Will verbalize positive feelings about self Outcome: Not Progressing Goal: Level of anxiety will decrease Outcome: Not Progressing   Problem: Education: Goal: Ability to state activities that reduce stress will improve Outcome: Not Progressing   Problem: Coping: Goal: Ability to identify and develop effective coping behavior will improve Outcome: Not Progressing   Problem: Self-Concept: Goal: Ability to identify factors that promote anxiety will improve Outcome: Not Progressing Goal: Level of anxiety will decrease Outcome: Not Progressing Goal: Ability to modify response to factors that promote anxiety will improve Outcome: Not Progressing   Problem: Education: Goal: Knowledge of [place] [facility] Education information/materials will improve Outcome: Not Progressing Goal: Emotional status will improve Outcome: Not Progressing Goal: Mental status will improve Outcome: Not Progressing Goal: Verbalization of understanding the information provided will improve Outcome: Not Progressing   Problem: Activity: Goal: Interest or engagement in activities will improve Outcome: Not Progressing Goal:  Sleeping patterns will improve Outcome: Not Progressing   Problem: Coping: Goal: Ability to verbalize frustrations and anger appropriately will improve Outcome: Not Progressing Goal: Ability to demonstrate self-control will improve Outcome: Not Progressing   Problem: Health Behavior/Discharge Planning: Goal: Identification of resources available to assist in meeting health care needs will improve Outcome: Not Progressing Goal: Compliance with treatment plan for underlying cause of condition will improve Outcome: Not Progressing   Problem: Physical  Regulation: Goal: Ability to maintain clinical measurements within normal limits will improve Outcome: Not Progressing   Problem: Safety: Goal: Periods of time without injury will increase Outcome: Not Progressing

## 2022-06-28 DIAGNOSIS — F200 Paranoid schizophrenia: Secondary | ICD-10-CM | POA: Diagnosis not present

## 2022-06-28 NOTE — Progress Notes (Signed)
D- Patient alert and oriented x 3-4. Affect bright/mood euthymic. Denies SI/HI/ AVH. He endorses depression and anxiety. He denies pain. States "I sold my watch on the street for a joint; I would do it again, I liked it". Expressing hyper religious thoughts throughout the shift. A- Scheduled medications administered to patient, per MD orders. Support and encouragement provided.  Routine safety checks conducted every 15 minutes.  Patient informed to notify staff with problems or concerns. R- No adverse drug reactions noted. Patient compliant with medications and treatment plan. Patient receptive, calm, and cooperative. Patient interacts well with others. He contracts for safety and remains safe on the unit at this time.

## 2022-06-28 NOTE — [facility] Group Notes (Signed)
[place] Group Notes:  (Nursing/MHT/Case Management/Adjunct)  Date:  06/28/2022  Time:  9:29 AM  Type of Therapy:   community meeting  Participation Level:  Did Not Attend    Lance [name] 06/28/2022, 9:29 AM

## 2022-06-28 NOTE — Plan of Care (Signed)
  Problem: Coping: Goal: Level of anxiety will decrease Outcome: Progressing   Problem: Pain Managment: Goal: General experience of comfort will improve Outcome: Progressing   Problem: Safety: Goal: Ability to remain free from injury will improve Outcome: Progressing   Problem: Activity: Goal: Interest or engagement in leisure activities will improve Outcome: Progressing   Problem: Coping: Goal: Coping ability will improve Outcome: Progressing

## 2022-06-28 NOTE — Plan of Care (Signed)
  Problem: Education: Goal: Knowledge of General Education information will improve Description: Including pain rating scale, medication(s)/side effects and non-pharmacologic comfort measures Outcome: Progressing   Problem: Health Behavior/Discharge Planning: Goal: Ability to manage health-related needs will improve Outcome: Progressing   Problem: Clinical Measurements: Goal: Ability to maintain clinical measurements within normal limits will improve Outcome: Progressing Goal: Will remain free from infection Outcome: Progressing Goal: Diagnostic test results will improve Outcome: Progressing Goal: Respiratory complications will improve Outcome: Progressing Goal: Cardiovascular complication will be avoided Outcome: Progressing   Problem: Activity: Goal: Risk for activity intolerance will decrease Outcome: Progressing   Problem: Nutrition: Goal: Adequate nutrition will be maintained Outcome: Progressing   Problem: Coping: Goal: Level of anxiety will decrease Outcome: Progressing   Problem: Elimination: Goal: Will not experience complications related to bowel motility Outcome: Progressing Goal: Will not experience complications related to urinary retention Outcome: Progressing   Problem: Pain Managment: Goal: General experience of comfort will improve Outcome: Progressing   Problem: Safety: Goal: Ability to remain free from injury will improve Outcome: Progressing   Problem: Skin Integrity: Goal: Risk for impaired skin integrity will decrease Outcome: Progressing   Problem: Education: Goal: Utilization of techniques to improve thought processes will improve Outcome: Progressing Goal: Knowledge of the prescribed therapeutic regimen will improve Outcome: Progressing   Problem: Activity: Goal: Interest or engagement in leisure activities will improve Outcome: Progressing Goal: Imbalance in normal sleep/wake cycle will improve Outcome: Progressing   Problem:  Coping: Goal: Coping ability will improve Outcome: Progressing Goal: Will verbalize feelings Outcome: Progressing   Problem: Health Behavior/Discharge Planning: Goal: Ability to make decisions will improve Outcome: Progressing Goal: Compliance with therapeutic regimen will improve Outcome: Progressing   Problem: Role Relationship: Goal: Will demonstrate positive changes in social behaviors and relationships Outcome: Progressing   Problem: Safety: Goal: Ability to disclose and discuss suicidal ideas will improve Outcome: Progressing Goal: Ability to identify and utilize support systems that promote safety will improve Outcome: Progressing   Problem: Self-Concept: Goal: Will verbalize positive feelings about self Outcome: Progressing Goal: Level of anxiety will decrease Outcome: Progressing   Problem: Education: Goal: Ability to state activities that reduce stress will improve Outcome: Progressing   Problem: Coping: Goal: Ability to identify and develop effective coping behavior will improve Outcome: Progressing   Problem: Self-Concept: Goal: Ability to identify factors that promote anxiety will improve Outcome: Progressing Goal: Level of anxiety will decrease Outcome: Progressing Goal: Ability to modify response to factors that promote anxiety will improve Outcome: Progressing   Problem: Education: Goal: Knowledge of [place] [facility] Education information/materials will improve Outcome: Progressing Goal: Emotional status will improve Outcome: Progressing Goal: Mental status will improve Outcome: Progressing Goal: Verbalization of understanding the information provided will improve Outcome: Progressing   Problem: Activity: Goal: Interest or engagement in activities will improve Outcome: Progressing Goal: Sleeping patterns will improve Outcome: Progressing   Problem: Coping: Goal: Ability to verbalize frustrations and anger appropriately will  improve Outcome: Progressing Goal: Ability to demonstrate self-control will improve Outcome: Progressing   Problem: Health Behavior/Discharge Planning: Goal: Identification of resources available to assist in meeting health care needs will improve Outcome: Progressing Goal: Compliance with treatment plan for underlying cause of condition will improve Outcome: Progressing   Problem: Physical Regulation: Goal: Ability to maintain clinical measurements within normal limits will improve Outcome: Progressing   Problem: Safety: Goal: Periods of time without injury will increase Outcome: Progressing

## 2022-06-28 NOTE — Progress Notes (Signed)
[facility] MD Progress Note  06/28/2022 12:14 PM Lance [name]  MRN:  [id number] Subjective: Lori is seen on rounds.  He has been compliant with medications and denies any side effects.  He is anxious about his future.  He asks if he can have some Gatorade.  Nurses report no issues. Principal Problem: Schizophrenia (HCC) Diagnosis: Principal Problem:   Schizophrenia (HCC)  Total Time spent with patient: 15 minutes  Past Psychiatric History: History of schizophrenia and multiple prior hospitalizations.  Past Medical History:  Past Medical History:  Diagnosis Date   Depression    Diabetes mellitus without complication (HCC)    Hyperlipidemia    Hypertension    Lupus anticoagulant disorder (HCC) 06/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   PE (pulmonary thromboembolism) (HCC)    Schizo affective schizophrenia (HCC)    Supratherapeutic INR 11/19/2016   History reviewed. No pertinent surgical history. Family History:  Family History  Problem Relation Age of Onset   CAD Mother    CAD Sister     Social History:  Social History   Substance and Sexual Activity  Alcohol Use Yes   Comment: occassionally     Social History   Substance and Sexual Activity  Drug Use Yes   Types: Marijuana   Comment: Last use 11/29/16    Social History   Socioeconomic History   Marital status: Single    Spouse name: Not on file   Number of children: Not on file   Years of education: Not on file   Highest education level: Not on file  Occupational History   Not on file  Tobacco Use   Smoking status: Every Day    Packs/day: 1.00    Years: 23.00    Total pack years: 23.00    Types: Cigarettes   Smokeless tobacco: Never   Tobacco comments:    will provide material  Substance and Sexual Activity   Alcohol use: Yes    Comment: occassionally   Drug use: Yes    Types: Marijuana    Comment: Last use 11/29/16   Sexual activity: Not Currently    Birth control/protection: None     Comment: occasional marijuana- none recently  Other Topics Concern   Not on file  Social History Narrative   From a group home in [place]   Social Determinants of Health   Financial Resource Strain: Low Risk  (03/20/2021)   Overall Financial Resource Strain (CARDIA)    Difficulty of Paying Living Expenses: Not very hard  Food Insecurity: Food Insecurity Present (06/17/2022)   Hunger Vital Sign    Worried About Running Out of Food in the Last Year: Sometimes true    Ran Out of Food in the Last Year: Sometimes true  Transportation Needs: Unmet Transportation Needs (06/17/2022)   PRAPARE - Transportation    Lack of Transportation (Medical): Yes    Lack of Transportation (Non-Medical): Yes  Physical Activity: Insufficiently Active (03/20/2021)   Exercise Vital Sign    Days of Exercise per Week: 3 days    Minutes of Exercise per Session: 20 min  Stress: No Stress Concern Present (03/20/2021)   [employer] of Occupational Health - Occupational Stress Questionnaire    Feeling of Stress : Only a little  Social Connections: Socially Isolated (03/20/2021)   Social Connection and Isolation Panel [NHANES]    Frequency of Communication with Friends and Family: More than three times a week    Frequency of Social Gatherings with Friends  and Family: More than three times a week    Attends Religious Services: Never    Active Member of Clubs or Organizations: No    Attends Archivist Meetings: Never    Marital Status: Never married   Additional Social History:                         Sleep: Good  Appetite:  Good  Current Medications: Current Facility-Administered Medications  Medication Dose Route Frequency Provider Last Rate Last Admin   acetaminophen (TYLENOL) tablet 650 mg  650 mg Oral Q6H PRN Sherlon Handing, NP   650 mg at 06/20/22 1736   albuterol (PROVENTIL) (2.5 MG/3ML) 0.083% nebulizer solution 2.5 mg  2.5 mg Nebulization Q6H PRN Alison Murray, RPH        alum & mag hydroxide-simeth (MAALOX/MYLANTA) 200-200-20 MG/5ML suspension 30 mL  30 mL Oral Q4H PRN Waldon Merl F, NP       apixaban (ELIQUIS) tablet 5 mg  5 mg Oral BID Clapacs, Madie Reno, MD   5 mg at 06/28/22 6160   clonazePAM (KLONOPIN) disintegrating tablet 0.25 mg  0.25 mg Oral BID PRN Clapacs, [name], MD   0.25 mg at 06/26/22 1116   cloZAPine (CLOZARIL) tablet 200 mg  200 mg Oral QHS Clapacs, [name], MD   200 mg at 06/27/22 2104   hydrOXYzine (ATARAX) tablet 25 mg  25 mg Oral TID PRN Sherlon Handing, NP   25 mg at 06/17/22 1717   hydrOXYzine (ATARAX) tablet 50 mg  50 mg Oral Q6H PRN Clapacs, Madie Reno, MD   50 mg at 06/27/22 2107   insulin detemir (LEVEMIR) injection 10 Units  10 Units Subcutaneous BID Clapacs, Madie Reno, MD   10 Units at 06/28/22 1008   lisinopril (ZESTRIL) tablet 2.5 mg  2.5 mg Oral Daily Clapacs, [name], MD   2.5 mg at 06/28/22 0818   lithium carbonate (ESKALITH) ER tablet 450 mg  450 mg Oral QHS Clapacs, [name], MD   450 mg at 06/27/22 2104   magnesium hydroxide (MILK OF MAGNESIA) suspension 30 mL  30 mL Oral Daily PRN Waldon Merl F, NP       metFORMIN (GLUCOPHAGE) tablet 1,000 mg  1,000 mg Oral BID WC Clapacs, [name], MD   1,000 mg at 06/28/22 0818   mometasone-formoterol (DULERA) 200-5 MCG/ACT inhaler 2 puff  2 puff Inhalation BID Clapacs, Madie Reno, MD   2 puff at 06/28/22 0819   multivitamin with minerals tablet 1 tablet  1 tablet Oral Daily Clapacs, Madie Reno, MD   1 tablet at 06/28/22 0818   nicotine polacrilex (NICORETTE) gum 2 mg  2 mg Oral Q2H PRN Clapacs, Madie Reno, MD   2 mg at 06/27/22 1951   OLANZapine (ZYPREXA) tablet 20 mg  20 mg Oral QHS Clapacs, [name], MD   20 mg at 06/27/22 2103   propranolol (INDERAL) tablet 20 mg  20 mg Oral QHS Clapacs, [name], MD   20 mg at 06/27/22 2104   protein supplement (ENSURE MAX) liquid  11 oz Oral BID Clapacs, [name], MD   237 mL at 06/28/22 0820   sertraline (ZOLOFT) tablet 100 mg  100 mg Oral Daily Clapacs, [name], MD   100  mg at 06/28/22 0820   simvastatin (ZOCOR) tablet 40 mg  40 mg Oral q1800 Clapacs, Madie Reno, MD   40 mg at 06/27/22 1607   traZODone (  DESYREL) tablet 100 mg  100 mg Oral QHS Clapacs, Madie Reno, MD   100 mg at 06/27/22 2104    Lab Results: No results found for this or any previous visit (from the past 48 hour(s)).  Blood Alcohol level:  Lab Results  Component Value Date   ETH <10 06/16/2022   ETH <10 60/03/9322    Metabolic Disorder Labs: Lab Results  Component Value Date   HGBA1C 4.9 04/26/2022   MPG 93.93 04/26/2022   MPG 108 11/12/2020   Lab Results  Component Value Date   PROLACTIN 2.0 (L) 08/31/2016   PROLACTIN 4.8 05/22/2016   Lab Results  Component Value Date   CHOL 163 04/28/2022   TRIG 378 (H) 04/28/2022   HDL 35 (L) 04/28/2022   CHOLHDL 4.7 04/28/2022   VLDL 76 (H) 04/28/2022   LDLCALC 52 04/28/2022   LDLCALC UNABLE TO CALCULATE IF TRIGLYCERIDE OVER 400 mg/dL 09/20/2020    Physical Findings: AIMS: Facial and Oral Movements Muscles of Facial Expression: None, normal Lips and Perioral Area: None, normal Jaw: None, normal Tongue: None, normal,Extremity Movements Upper (arms, wrists, hands, fingers): None, normal Lower (legs, knees, ankles, toes): None, normal, Trunk Movements Neck, shoulders, hips: None, normal, Overall Severity Severity of abnormal movements (highest score from questions above): None, normal Incapacitation due to abnormal movements: None, normal Patient's awareness of abnormal movements (rate only patient's report): No Awareness, Dental Status Current problems with teeth and/or dentures?: No Does patient usually wear dentures?: No  CIWA:    COWS:     Musculoskeletal: Strength & Muscle Tone: within normal limits Gait & Station: normal Patient leans: N/A  Psychiatric Specialty Exam:  Presentation  General Appearance:  Bizarre  Eye Contact: Minimal  Speech: Pressured  Speech Volume: Increased  Handedness: Right   Mood and  Affect  Mood: Anxious; Euphoric; Irritable  Affect: Congruent   Thought Process  Thought Processes: Disorganized  Descriptions of Associations:Loose  Orientation:Full (Time, Place and Person)  Thought Content:Illogical  History of Schizophrenia/Schizoaffective disorder:Yes  Duration of Psychotic Symptoms:Greater than six months  Hallucinations:No data recorded Ideas of Reference:Paranoia  Suicidal Thoughts:No data recorded Homicidal Thoughts:No data recorded  Sensorium  Memory: Immediate Fair; Recent Fair; Remote Fair  Judgment: Poor  Insight: Poor   Executive Functions  Concentration: Poor  Attention Span: Poor  Recall: Poor  Fund of Knowledge: Poor  Language: Poor   Psychomotor Activity  Psychomotor Activity:No data recorded  Assets  Assets: Communication Skills; Desire for Improvement; Housing; Physical Health; Resilience; Social Support   Sleep  Sleep:No data recorded   Blood pressure (!) 105/59, pulse (!) 59, temperature 97.7 F (36.5 C), temperature source Oral, resp. rate 17, height 5' 10.5" (1.791 m), weight 75.3 kg, SpO2 99 %. Body mass index is 23.48 kg/m.   Treatment Plan Summary: Daily contact with patient to assess and evaluate symptoms and progress in treatment, Medication management, and Plan continue current medications.  Parks Ranger, DO 06/28/2022, 12:14 PM

## 2022-06-29 DIAGNOSIS — F200 Paranoid schizophrenia: Secondary | ICD-10-CM | POA: Diagnosis not present

## 2022-06-29 NOTE — Progress Notes (Signed)
Patient presents with flat affect but brightens on approach. Denies SI, HI, AVH. Endorses anxiety. Noted walking halls, minimal interaction with peers. Requests prn for anxiety, given with good reflief. Encouragement and support provided. Safety checks maintained. Medications given as prescribed. Pt receptive and remains safe on unit with q 15 min checks.

## 2022-06-29 NOTE — Progress Notes (Signed)
Recreation Therapy Notes  Date: 06/29/2022   Time: 10:20 am   Location: Craft room      Behavioral response: N/A   Intervention Topic: Strengths   Discussion/Intervention: Patient refused to attend group.    Clinical Observations/Feedback:  Patient refused to attend group.    Allaya Abbasi LRT/CTRS        Chiyeko Ferre 06/29/2022 10:46 AM 

## 2022-06-29 NOTE — [facility] Counselor (Signed)
CSW followed up with [facility] about visiting the patient.    CSW was informed that [facility] is closed today, 06/29/2022, for the holiday.  CSW to follow up.  Assunta Curtis, MSW, LCSW 06/29/2022 9:26 AM

## 2022-06-29 NOTE — Progress Notes (Signed)
[facility] MD Progress Note  06/29/2022 9:39 AM Lance [name]  MRN:  [id number] Subjective: Follow-up patient with schizophrenia.  Patient seen and chart reviewed.  Patient has no new complaints today.  He says that he "mellowed out" a little bit over the weekend.  Has come to accept the likelihood that he will return to his previous group home.  Not making any threatening statements.  In casual conversation not having obvious psychotic symptoms. Principal Problem: Schizophrenia ([place]) Diagnosis: Principal Problem:   Schizophrenia ([place])  Total Time spent with patient: 30 minutes  Past Psychiatric History: Longstanding chronic schizophrenia and multiple prior hospitalizations  Past Medical History:  Past Medical History:  Diagnosis Date   Depression    Diabetes mellitus without complication ([place])    Hyperlipidemia    Hypertension    Lupus anticoagulant disorder ([place]) 06/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   PE (pulmonary thromboembolism) ([place])    Schizo affective schizophrenia ([place])    Supratherapeutic INR 11/19/2016   History reviewed. No pertinent surgical history. Family History:  Family History  Problem Relation Age of Onset   CAD Mother    CAD Sister    Family Psychiatric  History: See above Social History:  Social History   Substance and Sexual Activity  Alcohol Use Yes   Comment: occassionally     Social History   Substance and Sexual Activity  Drug Use Yes   Types: Marijuana   Comment: Last use 11/29/16    Social History   Socioeconomic History   Marital status: Single    Spouse name: Not on file   Number of children: Not on file   Years of education: Not on file   Highest education level: Not on file  Occupational History   Not on file  Tobacco Use   Smoking status: Every Day    Packs/day: 1.00    Years: 23.00    Total pack years: 23.00    Types: Cigarettes   Smokeless tobacco: Never   Tobacco comments:    will provide material  Substance  and Sexual Activity   Alcohol use: Yes    Comment: occassionally   Drug use: Yes    Types: Marijuana    Comment: Last use 11/29/16   Sexual activity: Not Currently    Birth control/protection: None    Comment: occasional marijuana- none recently  Other Topics Concern   Not on file  Social History Narrative   From a group home in [place] Determinants of Health   Financial Resource Strain: Low Risk  (03/20/2021)   Overall Financial Resource Strain (CARDIA)    Difficulty of Paying Living Expenses: Not very hard  Food Insecurity: Food Insecurity Present (06/17/2022)   Hunger Vital Sign    Worried About Running Out of Food in the Last Year: Sometimes true    Ran Out of Food in the Last Year: Sometimes true  Transportation Needs: Unmet Transportation Needs (06/17/2022)   PRAPARE - Transportation    Lack of Transportation (Medical): Yes    Lack of Transportation (Non-Medical): Yes  Physical Activity: Insufficiently Active (03/20/2021)   Exercise Vital Sign    Days of Exercise per Week: 3 days    Minutes of Exercise per Session: 20 min  Stress: No Stress Concern Present (03/20/2021)   [employer] [place]    Feeling of Stress : Only a little  Social Connections: Socially Isolated (03/20/2021)   Social Connection and  Isolation Panel [NHANES]    Frequency of Communication with Friends and Family: More than three times a week    Frequency of Social Gatherings with Friends and Family: More than three times a week    Attends Religious Services: Never    Database administrator or Organizations: No    Attends Engineer, structural: Never    Marital Status: Never married   Additional Social History:                         Sleep: Fair  Appetite:  Fair  Current Medications: Current Facility-Administered Medications  Medication Dose Route Frequency Provider Last Rate Last Admin   acetaminophen (TYLENOL)  tablet 650 mg  650 mg Oral Q6H PRN Vanetta Mulders, NP   650 mg at 06/20/22 1736   albuterol (PROVENTIL) (2.5 MG/3ML) 0.083% nebulizer solution 2.5 mg  2.5 mg Nebulization Q6H PRN Manfred Shirts, RPH       alum & mag hydroxide-simeth (MAALOX/MYLANTA) 200-200-20 MG/5ML suspension 30 mL  30 mL Oral Q4H PRN Gabriel Cirri F, NP       apixaban (ELIQUIS) tablet 5 mg  5 mg Oral BID [place] Trawick T, MD   5 mg at 06/29/22 0820   clonazePAM (KLONOPIN) disintegrating tablet 0.25 mg  0.25 mg Oral BID PRN Marieke Lubke T, MD   0.25 mg at 06/26/22 1116   cloZAPine (CLOZARIL) tablet 200 mg  200 mg Oral QHS Markanthony Gedney T, MD   200 mg at 06/28/22 2031   hydrOXYzine (ATARAX) tablet 25 mg  25 mg Oral TID PRN Vanetta Mulders, NP   25 mg at 06/17/22 1717   hydrOXYzine (ATARAX) tablet 50 mg  50 mg Oral Q6H PRN Yanelly Cantrelle, [name], MD   50 mg at 06/28/22 2032   insulin detemir (LEVEMIR) injection 10 Units  10 Units Subcutaneous BID Faizan Geraci, [name], MD   10 Units at 06/29/22 8185   lisinopril (ZESTRIL) tablet 2.5 mg  2.5 mg Oral Daily Jailyne Chieffo T, MD   2.5 mg at 06/29/22 0819   lithium carbonate (ESKALITH) ER tablet 450 mg  450 mg Oral QHS Hiba Garry T, MD   450 mg at 06/28/22 2032   magnesium hydroxide (MILK OF MAGNESIA) suspension 30 mL  30 mL Oral Daily PRN Gabriel Cirri F, NP       metFORMIN (GLUCOPHAGE) tablet 1,000 mg  1,000 mg Oral BID WC Oreoluwa Gilmer T, MD   1,000 mg at 06/29/22 0819   mometasone-formoterol (DULERA) 200-5 MCG/ACT inhaler 2 puff  2 puff Inhalation BID Bawi Lakins, [name], MD   2 puff at 06/29/22 6314   multivitamin with minerals tablet 1 tablet  1 tablet Oral Daily Janki Dike, [name], MD   1 tablet at 06/29/22 0820   nicotine polacrilex (NICORETTE) gum 2 mg  2 mg Oral Q2H PRN Marsela Kuan, [name], MD   2 mg at 06/29/22 9702   OLANZapine (ZYPREXA) tablet 20 mg  20 mg Oral QHS Deni Berti T, MD   20 mg at 06/28/22 2031   propranolol (INDERAL) tablet 20 mg  20 mg Oral QHS Paxtyn Wisdom T, MD    20 mg at 06/28/22 2031   protein supplement (ENSURE MAX) liquid  11 oz Oral BID Shandy Vi, [name], MD   11 oz at 06/28/22 1607   sertraline (ZOLOFT) tablet 100 mg  100 mg Oral Daily Wisdom Rickey, [name], MD   100 mg at  06/29/22 0819   simvastatin (ZOCOR) tablet 40 mg  40 mg Oral q1800 Joshua Soulier, Madie Reno, MD   40 mg at 06/28/22 1622   traZODone (DESYREL) tablet 100 mg  100 mg Oral QHS Tyan Lasure, Madie Reno, MD   100 mg at 06/28/22 2032    Lab Results: No results found for this or any previous visit (from the past 48 hour(s)).  Blood Alcohol level:  Lab Results  Component Value Date   ETH <10 06/16/2022   ETH <10 51/07/5850    Metabolic Disorder Labs: Lab Results  Component Value Date   HGBA1C 4.9 04/26/2022   MPG 93.93 04/26/2022   MPG 108 11/12/2020   Lab Results  Component Value Date   PROLACTIN 2.0 (L) 08/31/2016   PROLACTIN 4.8 05/22/2016   Lab Results  Component Value Date   CHOL 163 04/28/2022   TRIG 378 (H) 04/28/2022   HDL 35 (L) 04/28/2022   CHOLHDL 4.7 04/28/2022   VLDL 76 (H) 04/28/2022   LDLCALC 52 04/28/2022   LDLCALC UNABLE TO CALCULATE IF TRIGLYCERIDE OVER 400 mg/dL 09/20/2020    Physical Findings: AIMS: Facial and Oral Movements Muscles of Facial Expression: None, normal Lips and Perioral Area: None, normal Jaw: None, normal Tongue: None, normal,Extremity Movements Upper (arms, wrists, hands, fingers): None, normal Lower (legs, knees, ankles, toes): None, normal, Trunk Movements Neck, shoulders, hips: None, normal, Overall Severity Severity of abnormal movements (highest score from questions above): None, normal Incapacitation due to abnormal movements: None, normal Patient's awareness of abnormal movements (rate only patient's report): No Awareness, Dental Status Current problems with teeth and/or dentures?: No Does patient usually wear dentures?: No  CIWA:    COWS:     Musculoskeletal: Strength & Muscle Tone: within normal limits Gait & Station:  normal Patient leans: N/A  Psychiatric Specialty Exam:  Presentation  General Appearance:  Bizarre  Eye Contact: Minimal  Speech: Pressured  Speech Volume: Increased  Handedness: Right   Mood and Affect  Mood: Anxious; Euphoric; Irritable  Affect: Congruent   Thought Process  Thought Processes: Disorganized  Descriptions of Associations:Loose  Orientation:Full (Time, Place and Person)  Thought Content:Illogical  History of Schizophrenia/Schizoaffective disorder:Yes  Duration of Psychotic Symptoms:Greater than six months  Hallucinations:No data recorded Ideas of Reference:Paranoia  Suicidal Thoughts:No data recorded Homicidal Thoughts:No data recorded  Sensorium  Memory: Immediate Fair; Recent Fair; Remote Fair  Judgment: Poor  Insight: Poor   Executive Functions  Concentration: Poor  Attention Span: Poor  Recall: Poor  Fund of Knowledge: Poor  Language: Poor   Psychomotor Activity  Psychomotor Activity:No data recorded  Assets  Assets: Communication Skills; Desire for Improvement; Housing; Physical Health; Resilience; Social Support   Sleep  Sleep:No data recorded   Physical Exam: Physical Exam Vitals and nursing note reviewed.  Constitutional:      Appearance: Normal appearance.  HENT:     Head: Normocephalic and atraumatic.     Mouth/Throat:     Pharynx: Oropharynx is clear.  Eyes:     Pupils: Pupils are equal, round, and reactive to light.  Cardiovascular:     Rate and Rhythm: Normal rate and regular rhythm.  Pulmonary:     Effort: Pulmonary effort is normal.     Breath sounds: Normal breath sounds.  Abdominal:     General: Abdomen is flat.     Palpations: Abdomen is soft.  Musculoskeletal:        General: Normal range of motion.  Skin:    General: Skin is warm  and dry.  Neurological:     General: No focal deficit present.     Mental Status: He is alert. Mental status is at baseline.  Psychiatric:         Attention and Perception: Attention normal.        Mood and Affect: Mood normal. Affect is blunt.        Speech: Speech normal.        Behavior: Behavior is cooperative.        Thought Content: Thought content is paranoid.        Cognition and Memory: Cognition normal.        Judgment: Judgment normal.    Review of Systems  Constitutional: Negative.   HENT: Negative.    Eyes: Negative.   Respiratory: Negative.    Cardiovascular: Negative.   Gastrointestinal: Negative.   Musculoskeletal: Negative.   Skin: Negative.   Neurological: Negative.   Psychiatric/Behavioral: Negative.     Blood pressure 114/71, pulse (!) 58, temperature 98.7 F (37.1 C), temperature source Oral, resp. rate 18, height 5' 10.5" (1.791 m), weight 75.3 kg, SpO2 100 %. Body mass index is 23.48 kg/m.   Treatment Plan Summary: Medication management and Plan supportive counseling.  I agreed with him that it would be ideal if he talked with the representatives from his ACT team.  We made a tentative plan for likely discharge tomorrow.  Alethia Berthold, MD 06/29/2022, 9:39 AM
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
## 2022-06-29 NOTE — [facility] Group Notes (Addendum)
[place] Group Notes:  (Nursing/MHT/Case Management/Adjunct)  Date:  06/29/2022  Time:  10:26 PM  Type of Therapy:  Group Therapy  Participation Level:  None  Participation Quality:  Appropriate  Affect:  Flat  Cognitive:  Alert  Insight:  Lacking  Engagement in Group:  Improving  Modes of Intervention:  Discussion  Summary of Progress/Problems: Wrap up group completed during snack time.   Pt was only interested in his discharge plan for tomorrow. Not interested in much of the group topic.   Maglione,Sartaj Hoskin E 06/29/2022, 10:26 PM
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
## 2022-06-29 NOTE — Progress Notes (Signed)
D- Patient alert and oriented x 4. Affect flat/mood pleasant. Denies SI/ HI/ AVH. Denies anxiety or depression but consents to irritability. States he is going "back to group home tomorrow". A- Scheduled medications administered to patient, per MD orders. Support and encouragement provided.  Routine safety checks conducted every 15 minutes.  Patient informed to notify staff with problems or concerns. R- No adverse drug reactions noted. Patient compliant with medications and treatment plan. Patient receptive, calm, and cooperative. Patient interacts well with others. Patient spends the majority of his time outside of his room pacing in the hall. Patient contracts for safety and remains safe on the unit at this time.

## 2022-06-30 MED ORDER — METFORMIN HCL 1000 MG PO TABS: 1000.0000 mg | ORAL_TABLET | Freq: Two times a day (BID) | ORAL | 1 refills | Status: DC

## 2022-06-30 MED ORDER — NICOTINE POLACRILEX 2 MG MT GUM: 2.0000 mg | CHEWING_GUM | OROMUCOSAL | 0 refills | Status: DC | PRN

## 2022-06-30 MED ORDER — PROPRANOLOL HCL 20 MG PO TABS: 20.0000 mg | ORAL_TABLET | Freq: Every day | ORAL | 1 refills | Status: DC

## 2022-06-30 MED ORDER — FLUTICASONE-SALMETEROL 250-50 MCG/ACT IN AEPB: 1.0000 | INHALATION_SPRAY | Freq: Two times a day (BID) | RESPIRATORY_TRACT | 1 refills | Status: AC

## 2022-06-30 MED ORDER — CLOZAPINE 200 MG PO TABS: 200.0000 mg | ORAL_TABLET | Freq: Every day | ORAL | 1 refills | Status: DC

## 2022-06-30 MED ORDER — OLANZAPINE 20 MG PO TABS: 20.0000 mg | ORAL_TABLET | Freq: Every day | ORAL | 1 refills | Status: DC

## 2022-06-30 MED ORDER — INSULIN DETEMIR 100 UNIT/ML ~~LOC~~ SOLN: 10.0000 [IU] | Freq: Two times a day (BID) | SUBCUTANEOUS | 1 refills | Status: DC

## 2022-06-30 MED ORDER — APIXABAN 5 MG PO TABS: 5.0000 mg | ORAL_TABLET | Freq: Two times a day (BID) | ORAL | 1 refills | Status: DC

## 2022-06-30 MED ORDER — SERTRALINE HCL 100 MG PO TABS: 100.0000 mg | ORAL_TABLET | Freq: Every day | ORAL | 1 refills | Status: DC

## 2022-06-30 MED ORDER — HYDROXYZINE HCL 50 MG PO TABS: 50.0000 mg | ORAL_TABLET | Freq: Four times a day (QID) | ORAL | 1 refills | Status: DC | PRN

## 2022-06-30 MED ORDER — LISINOPRIL 2.5 MG PO TABS: 2.5000 mg | ORAL_TABLET | Freq: Every day | ORAL | 1 refills | Status: DC

## 2022-06-30 MED ORDER — CLONAZEPAM 0.25 MG PO TBDP: 0.2500 mg | ORAL_TABLET | Freq: Two times a day (BID) | ORAL | 1 refills | Status: DC | PRN

## 2022-06-30 MED ORDER — TRAZODONE HCL 100 MG PO TABS: 100.0000 mg | ORAL_TABLET | Freq: Every day | ORAL | 1 refills | Status: DC

## 2022-06-30 MED ORDER — LITHIUM CARBONATE ER 450 MG PO TBCR: 450.0000 mg | EXTENDED_RELEASE_TABLET | Freq: Every day | ORAL | 1 refills | Status: DC

## 2022-06-30 MED ORDER — ALBUTEROL SULFATE HFA 108 (90 BASE) MCG/ACT IN AERS: 1.0000 | INHALATION_SPRAY | Freq: Four times a day (QID) | RESPIRATORY_TRACT | 1 refills | Status: DC | PRN

## 2022-06-30 MED ORDER — SIMVASTATIN 40 MG PO TABS: 40.0000 mg | ORAL_TABLET | Freq: Every day | ORAL | 1 refills | Status: DC

## 2022-06-30 NOTE — [facility] Group Notes (Signed)
[facility] Group Notes:  (Nursing/MHT/Case Management/Adjunct)  Date:  06/30/2022  Time:  10:03 AM  Type of Therapy:   Community Meeting  Participation Level:  Did Not Attend   Lance [name] Travis Shavona Gunderman 06/30/2022, 10:03 AM 

## 2022-06-30 NOTE — Progress Notes (Signed)
Recreation Therapy Notes  INPATIENT RECREATION TR PLAN  Patient Details Name: Cordarrius Coad MRN: [id number] DOB: Dec 03, 1975 Today's Date: 06/30/2022  Rec Therapy Plan Is patient appropriate for Therapeutic Recreation?: Yes Treatment times per week: at least 3 Estimated Length of Stay: 5-7 days TR Treatment/Interventions: Group participation (Comment)  Discharge Criteria Pt will be discharged from therapy if:: Discharged Treatment plan/goals/alternatives discussed and agreed upon by:: Patient/family  Discharge Summary Short term goals set: Patient will engage in groups without prompting or encouragement from LRT x3 group sessions within 5 recreation therapy group sessions Short term goals met: Adequate for discharge Progress toward goals comments: Groups attended Which groups?: Leisure education, Other (Comment) (Time Management) Reason goals not met: N/A Therapeutic equipment acquired: N/A Reason patient discharged from therapy: Discharge from hospital Pt/family agrees with progress & goals achieved: Yes Date patient discharged from therapy: 06/30/22   Harlyn Rathmann 06/30/2022, 2:25 PM
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
## 2022-06-30 NOTE — Progress Notes (Signed)
Recreation Therapy Notes  INPATIENT RECREATION THERAPY ASSESSMENT  Patient Details Name: Lance [name] MRN: [id number] DOB: April 22, 1976 Today's Date: 06/30/2022       Information Obtained From: Patient  Able to Participate in Assessment/Interview: Yes  Patient Presentation: Responsive  Reason for Admission (Per Patient): Active Symptoms  Patient Stressors:    Coping Skills:   Isolation, Avoidance, Exercise  Leisure Interests (2+):  Music - Play instrument, Music - Listen, Exercise - Walking  Frequency of Recreation/Participation: Monthly  Awareness of Community Resources:  Yes  Community Resources:  Other (Comment) Proofreader)  Current Use: Yes  If no, Barriers?:    Expressed Interest in [employer] Information: Yes  County of Residence:  [place]  Patient Main Form of Transportation: Other (Comment) (Group home)  Patient Strengths:  Listening  Patient Identified Areas of Improvement:  Finding a new place to go  Patient Goal for Hospitalization:  Rest my body up  Current SI (including self-harm):  No  Current HI:  No  Current AVH: No  Staff Intervention Plan: Group Attendance, Collaborate with Interdisciplinary Treatment Team  Consent to Intern Participation: N/A  Lucillia Corson 06/30/2022, 2:25 PM

## 2022-06-30 NOTE — Progress Notes (Signed)
Recreation Therapy Notes   Date: 06/30/2022  Time: 10:15 am     Location: Craft room   Behavioral response: Appropriate  Intervention Topic:  Leisure    Discussion/Intervention:  Group content today was focused on leisure. The group defined what leisure is and some positive leisure activities they participate in. Individuals identified the difference between good and bad leisure. Participants expressed how they feel after participating in the leisure of their choice. The group discussed how they go about picking a leisure activity and if others are involved in their leisure activities. The patient stated how many leisure activities they have to choose from and reasons why it is important to have leisure time. Individuals participated in the intervention "Exploration of Leisure" where they had a chance to identify new leisure activities as well as benefits of leisure. Clinical Observations/Feedback: Patient came to group late due to unknown reasons.He stated that he likes to walk during his leisure time because it gives a sense of freedom. Participant left group early and did not return.         Lance [name] 06/30/2022 2:15 PM

## 2022-06-30 NOTE — Group Note (Signed)
LCSW Group Therapy Note  Group Date: 06/30/2022 Start Time: 1300 End Time: 1400   Type of Therapy and Topic:  Group Therapy - Healthy vs Unhealthy Coping Skills  Participation Level:  Minimal   Description of Group The focus of this group was to determine what unhealthy coping techniques typically are used by group members and what healthy coping techniques would be helpful in coping with various problems. Patients were guided in becoming aware of the differences between healthy and unhealthy coping techniques. Patients were asked to identify 2-3 healthy coping skills they would like to learn to use more effectively.  Therapeutic Goals Patients learned that coping is what human beings do all day long to deal with various situations in their lives Patients defined and discussed healthy vs unhealthy coping techniques Patients identified their preferred coping techniques and identified whether these were healthy or unhealthy Patients determined 2-3 healthy coping skills they would like to become more familiar with and use more often. Patients provided support and ideas to each other   Summary of Patient Progress:   Patient was present for the entirety of group session. Patient participated in opening and closing remarks. However, patient contributed very little to the topic of discussion despite encouraged participation.    Therapeutic Modalities Cognitive Behavioral Therapy Motivational Interviewing  Larose Kells 06/30/2022  2:35 PM

## 2022-06-30 NOTE — Plan of Care (Signed)
Problem: Education: Goal: Knowledge of General Education information will improve Description: Including pain rating scale, medication(s)/side effects and non-pharmacologic comfort measures Outcome: Adequate for Discharge   Problem: Health Behavior/Discharge Planning: Goal: Ability to manage health-related needs will improve Outcome: Adequate for Discharge   Problem: Clinical Measurements: Goal: Ability to maintain clinical measurements within normal limits will improve Outcome: Adequate for Discharge Goal: Will remain free from infection Outcome: Adequate for Discharge Goal: Diagnostic test results will improve Outcome: Adequate for Discharge Goal: Respiratory complications will improve Outcome: Adequate for Discharge Goal: Cardiovascular complication will be avoided Outcome: Adequate for Discharge   Problem: Activity: Goal: Risk for activity intolerance will decrease Outcome: Adequate for Discharge   Problem: Nutrition: Goal: Adequate nutrition will be maintained Outcome: Adequate for Discharge   Problem: Coping: Goal: Level of anxiety will decrease Outcome: Adequate for Discharge   Problem: Elimination: Goal: Will not experience complications related to bowel motility Outcome: Adequate for Discharge Goal: Will not experience complications related to urinary retention Outcome: Adequate for Discharge   Problem: Pain Managment: Goal: General experience of comfort will improve Outcome: Adequate for Discharge   Problem: Safety: Goal: Ability to remain free from injury will improve Outcome: Adequate for Discharge   Problem: Skin Integrity: Goal: Risk for impaired skin integrity will decrease Outcome: Adequate for Discharge   Problem: Education: Goal: Utilization of techniques to improve thought processes will improve Outcome: Adequate for Discharge Goal: Knowledge of the prescribed therapeutic regimen will improve Outcome: Adequate for Discharge   Problem:  Activity: Goal: Interest or engagement in leisure activities will improve Outcome: Adequate for Discharge Goal: Imbalance in normal sleep/wake cycle will improve Outcome: Adequate for Discharge   Problem: Coping: Goal: Coping ability will improve Outcome: Adequate for Discharge Goal: Will verbalize feelings Outcome: Adequate for Discharge   Problem: Health Behavior/Discharge Planning: Goal: Ability to make decisions will improve Outcome: Adequate for Discharge Goal: Compliance with therapeutic regimen will improve Outcome: Adequate for Discharge   Problem: Role Relationship: Goal: Will demonstrate positive changes in social behaviors and relationships Outcome: Adequate for Discharge   Problem: Safety: Goal: Ability to disclose and discuss suicidal ideas will improve Outcome: Adequate for Discharge Goal: Ability to identify and utilize support systems that promote safety will improve Outcome: Adequate for Discharge   Problem: Self-Concept: Goal: Will verbalize positive feelings about self Outcome: Adequate for Discharge Goal: Level of anxiety will decrease Outcome: Adequate for Discharge   Problem: Education: Goal: Ability to state activities that reduce stress will improve Outcome: Adequate for Discharge   Problem: Coping: Goal: Ability to identify and develop effective coping behavior will improve Outcome: Adequate for Discharge   Problem: Self-Concept: Goal: Ability to identify factors that promote anxiety will improve Outcome: Adequate for Discharge Goal: Level of anxiety will decrease Outcome: Adequate for Discharge Goal: Ability to modify response to factors that promote anxiety will improve Outcome: Adequate for Discharge   Problem: Education: Goal: Knowledge of [place] General Education information/materials will improve Outcome: Adequate for Discharge Goal: Emotional status will improve Outcome: Adequate for Discharge Goal: Mental status will  improve Outcome: Adequate for Discharge Goal: Verbalization of understanding the information provided will improve Outcome: Adequate for Discharge   Problem: Activity: Goal: Interest or engagement in activities will improve Outcome: Adequate for Discharge Goal: Sleeping patterns will improve Outcome: Adequate for Discharge   Problem: Coping: Goal: Ability to verbalize frustrations and anger appropriately will improve Outcome: Adequate for Discharge Goal: Ability to demonstrate self-control will improve Outcome: Adequate for Discharge  Problem: Health Behavior/Discharge Planning: Goal: Identification of resources available to assist in meeting health care needs will improve Outcome: Adequate for Discharge Goal: Compliance with treatment plan for underlying cause of condition will improve Outcome: Adequate for Discharge   Problem: Physical Regulation: Goal: Ability to maintain clinical measurements within normal limits will improve Outcome: Adequate for Discharge   Problem: Safety: Goal: Periods of time without injury will increase Outcome: Adequate for Discharge   Problem: Group Participation Goal: STG - Patient will engage in groups without prompting or encouragement from LRT x3 group sessions within 5 recreation therapy group sessions Description: STG - Patient will engage in groups without prompting or encouragement from LRT x3 group sessions within 5 recreation therapy group sessions Outcome: Adequate for Discharge

## 2022-06-30 NOTE — Care Management Important Message (Signed)
Important Message  Patient Details  Name: Lance [name] MRN: [id number] Date of Birth: Sep 26, 1975   Medicare Important Message Given:  Yes     Rozann Lesches, LCSW 06/30/2022, 9:39 AM

## 2022-06-30 NOTE — Progress Notes (Signed)
Patient ID: Lance [name], male   DOB: 11-02-1975, 47 y.o.   MRN: [id number] Patient denies SI/HI/AVH. Belongings were returned to patient. Discharge instructions  including medication and follow up information were reviewed with patient and understanding was verbalized. Patient was not observed to be in distress at time of discharge. Patient was escorted out with staff to medical mall to be transported to group home.

## 2022-06-30 NOTE — BHH Suicide Risk Assessment (Signed)
[facility] Discharge Suicide Risk Assessment   Principal Problem: Schizophrenia [facility]) Discharge Diagnoses: Principal Problem:   Schizophrenia ([name])   Total Time spent with patient: 30 minutes  Musculoskeletal: Strength & Muscle Tone: within normal limits Gait & Station: normal Patient leans: N/A  Psychiatric Specialty Exam  Presentation  General Appearance:  Bizarre  Eye Contact: Minimal  Speech: Pressured  Speech Volume: Increased  Handedness: Right   Mood and Affect  Mood: Anxious; Euphoric; Irritable  Duration of Depression Symptoms: Greater than two weeks  Affect: Congruent   Thought Process  Thought Processes: Disorganized  Descriptions of Associations:Loose  Orientation:Full (Time, Place and Person)  Thought Content:Illogical  History of Schizophrenia/Schizoaffective disorder:Yes  Duration of Psychotic Symptoms:Greater than six months  Hallucinations:No data recorded Ideas of Reference:Paranoia  Suicidal Thoughts:No data recorded Homicidal Thoughts:No data recorded  Sensorium  Memory: Immediate Fair; Recent Fair; Remote Fair  Judgment: Poor  Insight: Poor   Executive Functions  Concentration: Poor  Attention Span: Poor  Recall: Poor  Fund of Knowledge: Poor  Language: Poor   Psychomotor Activity  Psychomotor Activity:No data recorded  Assets  Assets: Communication Skills; Desire for Improvement; Housing; Physical Health; Resilience; Social Support   Sleep  Sleep:No data recorded  Physical Exam: Physical Exam Vitals and nursing note reviewed.  Constitutional:      Appearance: Normal appearance.  HENT:     Head: Normocephalic and atraumatic.     Mouth/Throat:     Pharynx: Oropharynx is clear.  Eyes:     Pupils: Pupils are equal, round, and reactive to light.  Cardiovascular:     Rate and Rhythm: Normal rate and regular rhythm.  Pulmonary:     Effort: Pulmonary effort is normal.     Breath sounds: Normal  breath sounds.  Abdominal:     General: Abdomen is flat.     Palpations: Abdomen is soft.  Musculoskeletal:        General: Normal range of motion.  Skin:    General: Skin is warm and dry.  Neurological:     General: No focal deficit present.     Mental Status: He is alert. Mental status is at baseline.  Psychiatric:        Attention and Perception: Attention normal.        Mood and Affect: Mood normal. Affect is blunt.        Speech: Speech is delayed.        Behavior: Behavior is slowed.        Thought Content: Thought content is delusional. Thought content does not include homicidal or suicidal ideation.        Cognition and Memory: Cognition normal.    Review of Systems  Constitutional: Negative.   HENT: Negative.    Eyes: Negative.   Respiratory: Negative.    Cardiovascular: Negative.   Gastrointestinal: Negative.   Musculoskeletal: Negative.   Skin: Negative.   Neurological: Negative.   Psychiatric/Behavioral: Negative.     Blood pressure (!) 99/58, pulse (!) 58, temperature 97.8 F (36.6 C), temperature source Oral, resp. rate 20, height 5' 10.5" (1.791 m), weight 75.3 kg, SpO2 99 %. Body mass index is 23.48 kg/m.  Mental Status Per Nursing Assessment::   On Admission:  Suicidal ideation indicated by patient, Thoughts of violence towards others  Demographic Factors:  Male, Caucasian, and Unemployed  Loss Factors: NA  Historical Factors: NA  Risk Reduction Factors:   Living with another person, especially a relative, Positive social support, and Positive therapeutic relationship  Continued Clinical Symptoms:  Schizophrenia:   Paranoid or undifferentiated type  Cognitive Features That Contribute To Risk:  None    Suicide Risk:  Minimal: No identifiable suicidal ideation.  Patients presenting with no risk factors but with morbid ruminations; may be classified as minimal risk based on the severity of the depressive symptoms   Follow-up [place]. .   Why: Your ACTT team will follow up with you 06/30/2022 after you arrive home.  They are aware of your exxpected discharge. Contact information: [place] [address] [phone]                 Plan Of Care/Follow-up recommendations:  Other:  Patient seen and chart reviewed.  Patient has shown no behavior problems on the unit.  Denies any suicidal thoughts.  Patient has been cooperative with medication and agrees to discharge plan back to the group home and outpatient treatment.  No longer meets commitment criteria.  Alethia Berthold, MD 06/30/2022, 10:00 AM

## 2022-06-30 NOTE — NC FL2 (Signed)
[place] LEVEL OF CARE FORM     IDENTIFICATION  Patient Name: Lance [name] Birthdate: 1976/02/06 Sex: male Admission Date (Current Location): 06/17/2022  [facility] and [place] Number:  Engineering geologist and Address:  [facility], [address] [place], [place] [address]      Provider Number: [id number]  Attending Physician Name and Address:  No att. providers found  Relative Name and Phone Number:  Timoteo Ace, legal guardian, [phone]    Current Level of Care: Hospital Recommended Level of Care: [place], [place], Other (Comment) (Group Home) Prior Approval Number:    Date Approved/Denied:   PASRR Number:    Discharge Plan: Other (Comment) (Group home, [facility], [place])    Current Diagnoses: Patient Active Problem List   Diagnosis Date Noted   Odontogenic infection of jaw 06/09/2022   Cellulitis of face 06/06/2022   AKI (acute kidney injury) ([place]) 06/06/2022   Boxer's metacarpal fracture, neck, closed 04/27/2022   Schizophrenia ([place]) 04/27/2022   Lupus anticoagulant syndrome ([place]) 03/05/2022   Insomnia    Hallucinations    Alcohol abuse 04/12/2018   Acute psychosis ([place]) 10/15/2017   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   Tobacco use disorder 09/01/2016   Schizoaffective disorder, bipolar type ([place]) 05/22/2016   Diabetes mellitus without complication ([place]) 61/44/3154   Hyperlipidemia 05/21/2016   Chronic anticoagulation 05/21/2016   Hypertension 09/25/2015    Orientation RESPIRATION BLADDER Height & Weight     Self, Time, Situation, Place  Normal Continent Weight: 166 lb (75.3 kg) Height:  5' 10.5" (179.1 cm)  BEHAVIORAL SYMPTOMS/MOOD NEUROLOGICAL BOWEL NUTRITION STATUS  Other (Comment) (NA)  (NA) Continent Diet (Regular)  AMBULATORY STATUS COMMUNICATION OF NEEDS Skin   Independent Verbally Normal                       Personal Care  Assistance Level of Assistance   (NA)           Functional Limitations Info   (NA)          SPECIAL CARE FACTORS FREQUENCY   (NA)                    Contractures Contractures Info: Not present    Additional Factors Info    Code Status Info: Full Allergies Info: Penicillins           Current Medications (06/30/2022):  This is the current hospital active medication list Current Facility-Administered Medications  Medication Dose Route Frequency Provider Last Rate Last Admin   acetaminophen (TYLENOL) tablet 650 mg  650 mg Oral Q6H PRN Sherlon Handing, NP   650 mg at 06/20/22 1736   albuterol (PROVENTIL) (2.5 MG/3ML) 0.083% nebulizer solution 2.5 mg  2.5 mg Nebulization Q6H PRN Alison Murray, RPH       alum & mag hydroxide-simeth (MAALOX/MYLANTA) 200-200-20 MG/5ML suspension 30 mL  30 mL Oral Q4H PRN Waldon Merl F, NP       apixaban (ELIQUIS) tablet 5 mg  5 mg Oral BID Clapacs, Madie Reno, MD   5 mg at 06/30/22 0940   clonazePAM (KLONOPIN) disintegrating tablet 0.25 mg  0.25 mg Oral BID PRN Clapacs, Madie Reno, MD   0.25 mg at 06/26/22 1116   cloZAPine (CLOZARIL) tablet 200 mg  200 mg Oral QHS Clapacs, [name], MD   200 mg at 06/29/22 2111   hydrOXYzine (ATARAX) tablet 25 mg  25  mg Oral TID PRN Sherlon Handing, NP   25 mg at 06/17/22 1717   hydrOXYzine (ATARAX) tablet 50 mg  50 mg Oral Q6H PRN Clapacs, Madie Reno, MD   50 mg at 06/29/22 2111   insulin detemir (LEVEMIR) injection 10 Units  10 Units Subcutaneous BID Clapacs, Madie Reno, MD   10 Units at 06/30/22 0941   lisinopril (ZESTRIL) tablet 2.5 mg  2.5 mg Oral Daily Clapacs, Madie Reno, MD   2.5 mg at 06/30/22 0943   lithium carbonate (ESKALITH) ER tablet 450 mg  450 mg Oral QHS Clapacs, [name], MD   450 mg at 06/29/22 2111   magnesium hydroxide (MILK OF MAGNESIA) suspension 30 mL  30 mL Oral Daily PRN Waldon Merl F, NP       metFORMIN (GLUCOPHAGE) tablet 1,000 mg  1,000 mg Oral BID WC Clapacs, [name], MD   1,000 mg at  06/30/22 0940   mometasone-formoterol (DULERA) 200-5 MCG/ACT inhaler 2 puff  2 puff Inhalation BID Clapacs, Madie Reno, MD   2 puff at 06/30/22 0943   multivitamin with minerals tablet 1 tablet  1 tablet Oral Daily Clapacs, Madie Reno, MD   1 tablet at 06/30/22 0940   nicotine polacrilex (NICORETTE) gum 2 mg  2 mg Oral Q2H PRN Clapacs, Madie Reno, MD   2 mg at 06/30/22 1420   OLANZapine (ZYPREXA) tablet 20 mg  20 mg Oral QHS Clapacs, [name], MD   20 mg at 06/29/22 2111   propranolol (INDERAL) tablet 20 mg  20 mg Oral QHS Clapacs, [name], MD   20 mg at 06/29/22 2111   protein supplement (ENSURE MAX) liquid  11 oz Oral BID Clapacs, Madie Reno, MD   11 oz at 06/30/22 0940   sertraline (ZOLOFT) tablet 100 mg  100 mg Oral Daily Clapacs, [name], MD   100 mg at 06/30/22 0940   simvastatin (ZOCOR) tablet 40 mg  40 mg Oral q1800 Clapacs, [name], MD   40 mg at 06/29/22 1804   traZODone (DESYREL) tablet 100 mg  100 mg Oral QHS Clapacs, [name], MD   100 mg at 06/29/22 2112   Current Outpatient Medications  Medication Sig Dispense Refill   albuterol (VENTOLIN HFA) 108 (90 Base) MCG/ACT inhaler Inhale 1-2 puffs into the lungs every 6 (six) hours as needed for wheezing or shortness of breath. 18 g 1   apixaban (ELIQUIS) 5 MG TABS tablet Take 1 tablet (5 mg total) by mouth 2 (two) times daily. 60 tablet 1   blood glucose meter kit and supplies Dispense based on patient and insurance preference. Use up to four times daily as directed. (FOR ICD-10 E10.9, E11.9). 1 each 6   clonazePAM (KLONOPIN) 0.25 MG disintegrating tablet Take 1 tablet (0.25 mg total) by mouth 2 (two) times daily as needed (anxiety). 60 tablet 1   clozapine (CLOZARIL) 200 MG tablet Take 1 tablet (200 mg total) by mouth at bedtime. 30 tablet 1   fluticasone-salmeterol (ADVAIR) 250-50 MCG/ACT AEPB Inhale 1 puff into the lungs 2 (two) times daily. 60 each 1   glucose blood (FORA V30A BLOOD GLUCOSE TEST) test strip CHECK BLOOD SUGAR TWICE DAILY. 100 strip 3    hydrOXYzine (ATARAX) 50 MG tablet Take 1 tablet (50 mg total) by mouth every 6 (six) hours as needed for anxiety. 60 tablet 1   insulin detemir (LEVEMIR) 100 UNIT/ML injection Inject 0.1 mLs (10 Units total) into the skin 2 (two) times daily. 10  mL 1   lisinopril (ZESTRIL) 2.5 MG tablet Take 1 tablet (2.5 mg total) by mouth daily. 30 tablet 1   lithium carbonate (ESKALITH) 450 MG ER tablet Take 1 tablet (450 mg total) by mouth at bedtime. 30 tablet 1   metFORMIN (GLUCOPHAGE) 1000 MG tablet Take 1 tablet (1,000 mg total) by mouth 2 (two) times daily with a meal. 60 tablet 1   nicotine polacrilex (NICORETTE) 2 MG gum Take 1 each (2 mg total) by mouth every 2 (two) hours as needed for smoking cessation. 100 tablet 0   NOVOFINE AUTOCOVER PEN NEEDLE 30G X 8 MM MISC USE WITH LEVEMIR TWICE DAILY. 100 each 11   OLANZapine (ZYPREXA) 20 MG tablet Take 1 tablet (20 mg total) by mouth at bedtime. 30 tablet 1   propranolol (INDERAL) 20 MG tablet Take 1 tablet (20 mg total) by mouth at bedtime. 30 tablet 1   sertraline (ZOLOFT) 100 MG tablet Take 1 tablet (100 mg total) by mouth daily. 30 tablet 1   simvastatin (ZOCOR) 40 MG tablet Take 1 tablet (40 mg total) by mouth daily at 6 PM. 30 tablet 1   traZODone (DESYREL) 100 MG tablet Take 1 tablet (100 mg total) by mouth at bedtime. 30 tablet 1     Discharge Medications: Please see discharge summary for a list of discharge medications.  Relevant Imaging Results:  Relevant Lab Results:   Additional Information [name], guardian, [phone]  [name], LCSW

## 2022-06-30 NOTE — Progress Notes (Signed)
  [facility] Adult Case Management Discharge Plan :  Will you be returning to the same living situation after discharge:  Yes,  pt returning to his group home At discharge, do you have transportation home?: Yes,  group home will provide transportation. Do you have the ability to pay for your medications: Yes,  MEDICARE / MEDICARE PART A AND B  Release of information consent forms completed and in the chart;  Patient's signature needed at discharge.  Patient to Follow up at:  Follow-up [place]   Why: Your ACTT team will follow up with you 06/30/2022 after you arrive home.  They are aware of your exxpected discharge. Contact information: [place] [address] [phone]                 Next level of care provider has access to [place] and Suicide Prevention discussed: Yes,  SPE completed with patient and guardian.     Has patient been referred to the Quitline?: Patient refused referral  Patient has been referred for addiction treatment: Pt. refused referral  Rozann Lesches, LCSW 06/30/2022, 9:48 AM

## 2022-07-09 ENCOUNTER — Encounter: Payer: Self-pay | Admitting: *Deleted

## 2022-07-09 ENCOUNTER — Emergency Department
Admission: EM | Admit: 2022-07-09 | Discharge: 2022-07-10 | Disposition: A | Discharge status: Home / Self Care | Payer: Medicare Other | Attending: Emergency Medicine | Admitting: Emergency Medicine

## 2022-07-09 ENCOUNTER — Other Ambulatory Visit: Payer: Self-pay

## 2022-07-09 DIAGNOSIS — I10 Essential (primary) hypertension: Secondary | ICD-10-CM | POA: Diagnosis not present

## 2022-07-09 DIAGNOSIS — E119 Type 2 diabetes mellitus without complications: Secondary | ICD-10-CM | POA: Diagnosis not present

## 2022-07-09 DIAGNOSIS — F209 Schizophrenia, unspecified: Secondary | ICD-10-CM | POA: Diagnosis not present

## 2022-07-09 DIAGNOSIS — R443 Hallucinations, unspecified: Secondary | ICD-10-CM | POA: Diagnosis present

## 2022-07-09 LAB — COMPREHENSIVE METABOLIC PANEL
ALT: 28 U/L (ref 0–44)
AST: 21 U/L (ref 15–41)
Albumin: 4.8 g/dL (ref 3.5–5.0)
Alkaline Phosphatase: 54 U/L (ref 38–126)
Anion gap: 6 (ref 5–15)
BUN: 27 mg/dL — ABNORMAL HIGH (ref 6–20)
CO2: 27 mmol/L (ref 22–32)
Calcium: 9.6 mg/dL (ref 8.9–10.3)
Chloride: 106 mmol/L (ref 98–111)
Creatinine, Ser: 1.29 mg/dL — ABNORMAL HIGH (ref 0.61–1.24)
GFR, Estimated: >60 mL/min (ref >60)
Glucose, Bld: 108 mg/dL — ABNORMAL HIGH (ref 70–99)
Potassium: 4.3 mmol/L (ref 3.5–5.1)
Sodium: 139 mmol/L (ref 135–145)
Total Bilirubin: 0.6 mg/dL (ref 0.3–1.2)
Total Protein: 7.5 g/dL (ref 6.5–8.1)

## 2022-07-09 LAB — CBC
HCT: 44 % (ref 39.0–52.0)
Hemoglobin: 14.6 g/dL (ref 13.0–17.0)
MCH: 30.6 pg (ref 26.0–34.0)
MCHC: 33.2 g/dL (ref 30.0–36.0)
MCV: 92.2 fL (ref 80.0–100.0)
Platelets: 236 10*3/uL (ref 150–400)
RBC: 4.77 MIL/uL (ref 4.22–5.81)
RDW: 13 % (ref 11.5–15.5)
WBC: 6.6 10*3/uL (ref 4.0–10.5)
nRBC: 0 % (ref 0.0–0.2)

## 2022-07-09 LAB — URINE DRUG SCREEN, QUALITATIVE (ARMC ONLY)
Amphetamines, Ur Screen: NOT DETECTED
Barbiturates, Ur Screen: NOT DETECTED
Benzodiazepine, Ur Scrn: NOT DETECTED
Cannabinoid 50 Ng, Ur ~~LOC~~: NOT DETECTED
Cocaine Metabolite,Ur ~~LOC~~: NOT DETECTED
MDMA (Ecstasy)Ur Screen: NOT DETECTED
Methadone Scn, Ur: NOT DETECTED
Opiate, Ur Screen: NOT DETECTED
Phencyclidine (PCP) Ur S: NOT DETECTED
Tricyclic, Ur Screen: NOT DETECTED

## 2022-07-09 LAB — ACETAMINOPHEN LEVEL: Acetaminophen (Tylenol), Serum: <10 ug/mL — ABNORMAL LOW (ref 10–30)

## 2022-07-09 LAB — ETHANOL: Alcohol, Ethyl (B): <10 mg/dL (ref <10)

## 2022-07-09 LAB — SALICYLATE LEVEL: Salicylate Lvl: <7 mg/dL — ABNORMAL LOW (ref 7.0–30.0)

## 2022-07-09 MED ORDER — IBUPROFEN 600 MG PO TABS: 600.0000 mg | ORAL_TABLET | Freq: Three times a day (TID) | ORAL | Status: DC | PRN

## 2022-07-09 MED ORDER — ALUM & MAG HYDROXIDE-SIMETH 200-200-20 MG/5ML PO SUSP: 30.0000 mL | Freq: Four times a day (QID) | ORAL | Status: DC | PRN

## 2022-07-09 MED ORDER — ONDANSETRON HCL 4 MG PO TABS: 4.0000 mg | ORAL_TABLET | Freq: Three times a day (TID) | ORAL | Status: DC | PRN

## 2022-07-09 NOTE — ED Triage Notes (Signed)
Pt brought in BPD.  Pt is Voluntary.   Pt sates he is hallucinating.  Pt denies SI or HI.  Denies ETOH use.  Reports using marijuana.  Pt calm and cooperative.

## 2022-07-09 NOTE — ED Notes (Signed)
VOL  

## 2022-07-09 NOTE — ED Provider Notes (Signed)
[facility] Provider Note    Event Date/Time   First MD Initiated Contact with Patient 07/09/22 1954     (approximate)   History   Chief Complaint: Psychiatric Evaluation   HPI  Lance [name] is a 47 y.o. male with a history of diabetes hypertension and schizophrenia who comes the ED complaining of increased hallucinations today.  Denies alcohol or drug use.  Denies SI or HI.     Physical Exam   Triage Vital Signs: ED Triage Vitals  Enc Vitals Group     BP 07/09/22 1943 116/81     Pulse Rate 07/09/22 1936 (!) 55     Resp 07/09/22 1936 18     Temp 07/09/22 1936 97.7 F (36.5 C)     Temp Source 07/09/22 1936 Oral     SpO2 07/09/22 1936 98 %     Weight 07/09/22 1931 165 lb 5.5 oz (75 kg)     Height 07/09/22 1931 5\' 10"  (1.778 m)     Head Circumference --      Peak Flow --      Pain Score 07/09/22 1931 0     Pain Loc --      Pain Edu? --      Excl. in [place]? --     Most recent vital signs: Vitals:   07/09/22 1936 07/09/22 1943  BP:  116/81  Pulse: (!) 55   Resp: 18   Temp: 97.7 F (36.5 C)   SpO2: 98%     General: Awake, no distress.  CV:  Good peripheral perfusion.  Resp:  Normal effort.  Abd:  No distention.  Other:     ED Results / Procedures / Treatments   Labs (all labs ordered are listed, but only abnormal results are displayed) Labs Reviewed  COMPREHENSIVE METABOLIC PANEL - Abnormal; Notable for the following components:      Result Value   Glucose, Bld 108 (*)    BUN 27 (*)    Creatinine, Ser 1.29 (*)    All other components within normal limits  SALICYLATE LEVEL - Abnormal; Notable for the following components:   Salicylate Lvl <3.8 (*)    All other components within normal limits  ACETAMINOPHEN LEVEL - Abnormal; Notable for the following components:   Acetaminophen (Tylenol), Serum <10 (*)    All other components within normal limits  ETHANOL  CBC  URINE DRUG SCREEN, QUALITATIVE ([facility] ONLY)      EKG    RADIOLOGY    PROCEDURES:  Procedures   MEDICATIONS ORDERED IN ED: Medications  ibuprofen (ADVIL) tablet 600 mg (has no administration in time range)  ondansetron (ZOFRAN) tablet 4 mg (has no administration in time range)  alum & mag hydroxide-simeth (MAALOX/MYLANTA) 200-200-20 MG/5ML suspension 30 mL (has no administration in time range)     IMPRESSION / MDM / ASSESSMENT AND PLAN / ED COURSE  I reviewed the triage vital signs and the nursing notes.                                Patient's presentation is most consistent with exacerbation of chronic illness.  Patient presents with hallucinations related to schizophrenia which is a chronic illness for the patient.  Not an imminent danger to himself or others.  Medically stable.  Will consult psychiatry and follow-up recommendations.  The patient has been placed in psychiatric observation due to the need to provide  a safe environment for the patient while obtaining psychiatric consultation and evaluation, as well as ongoing medical and medication management to treat the patient's condition.  The patient has not been placed under full IVC at this time.        FINAL CLINICAL IMPRESSION(S) / ED DIAGNOSES   Final diagnoses:  Schizophrenia, unspecified type ([place])     Rx / DC Orders   ED Discharge Orders     None        Note:  This document was prepared using Dragon voice recognition software and may include unintentional dictation errors.   Carrie Mew, MD 07/10/22 [phone]
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
## 2022-07-09 NOTE — ED Notes (Signed)
Patient calm and cooperative at this time. Patient observed RIS and when asked patient states "I am talking to myself." No behavior issues. Plan of care continues.

## 2022-07-09 NOTE — ED Notes (Signed)
White sneakers Checkered button down shirt Orange socks Black/red jacket Orange hoodie [place] crew belt Change Vape Lighter Blue boxers Hankerchiefs 4 Psychologist, occupational

## 2022-07-10 NOTE — ED Notes (Signed)
PT given lunch tray and beverage 

## 2022-07-10 NOTE — ED Notes (Signed)
Pt's legal guardian, [name] states that she contacted Gerald Stabs with the patient's [employer] team and that he will be picking up patient and transporting him back to his group home.

## 2022-07-10 NOTE — ED Notes (Addendum)
[place] Other [phone]  Called group home with no answer.  Timoteo Ace Legal Guardian [phone]) Legal guardian ---RN called to update that pt is still here and awaiting transportation home.

## 2022-07-10 NOTE — ED Notes (Signed)
RN attempted to contact group home with no answer. RN spoke to [name] (legal guardian) who states she will attempt to contact group home for transportation.

## 2022-07-10 NOTE — ED Notes (Signed)
Pt requested shower; provided clean hospital clothing and linens.  Shower setup provided with soap, shampoo, toothbrush/toothpaste, and deodorant.  Pt able to preform own ADL's with no assistance.    

## 2022-07-10 NOTE — ED Notes (Signed)
Lance [name] from [place] picked patient up to transport back to group home. Belonging returned. Paperwork signed. Patient cooperative with discharge.

## 2022-07-10 NOTE — ED Notes (Signed)
[place] home called to inquire about picking up patient, no answer and unable to leave VM.

## 2022-07-10 NOTE — ED Notes (Signed)
VOL/pending psych consult 

## 2022-07-10 NOTE — ED Notes (Signed)
RN attempted to contact Group home but no answer.

## 2022-07-10 NOTE — ED Notes (Signed)
Pt given breakfast tray and beverage.  

## 2022-07-10 NOTE — ED Provider Notes (Signed)
-----------------------------------------   6:03 AM on 07/10/2022 -----------------------------------------   Patient cleared by psychiatry; deems patient psychiatrically stable for discharge home.  Return precautions given.  Patient verbalizes understanding and agrees with plan of care.   Paulette Blanch, MD 07/10/22 [phone]

## 2022-07-10 NOTE — ED Notes (Signed)
RN attempted to call group home, [place] home to get further information on when they would be coming to pickup patient. Spoke with staff for short period of time, call dropped. RN called back multiple times with no answer. RN then was able to get answer and was told by staff that they would have to call boss as they are not allowed to leave at this time. RN confirmed that some type of staff would be coming to pick up patient.

## 2022-07-10 NOTE — Discharge Instructions (Signed)
Take your medicines daily as directed by your doctor.  Return to the ER for worsening symptoms, feelings of hurting yourself or others, or other concerns. 

## 2022-07-29 NOTE — Congregational Nurse Program (Signed)
  Dept: [phone]   Congregational Nurse Program Note  Date of Encounter: 07/29/2022 Client to [facility] day center with request for toe nail care. Nails trimmed. No open areas or redness noted to feet/toes. Client has well fitting shoes on, he lives in a near by group home. No other needs at this time. Past Medical History: Past Medical History:  Diagnosis Date   Depression    Diabetes mellitus without complication ([place])    Hyperlipidemia    Hypertension    Lupus anticoagulant disorder ([place]) 06/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   PE (pulmonary thromboembolism) ([place])    Schizo affective schizophrenia ([place])    Supratherapeutic INR 11/19/2016    Encounter Details:  CNP Questionnaire - 07/29/22 1125       Questionnaire   Ask client: Do you give verbal consent for me to treat you today? Yes    Student Assistance N/A    Location Patient [place]    Visit Setting with Client Organization    Patient Status Unknown   client lives in a group home   Insurance Medicaid;Medicare    Insurance/Financial Assistance Referral N/A    Medication N/A    Medical Provider Yes    Screening Referrals Made N/A    Medical Referrals Made N/A    Medical Appointment Made N/A    Recently w/o PCP, now 1st time PCP visit completed due to CNs referral or appointment made N/A    Food N/A    Transportation N/A    Housing/Utilities N/A   client lives in a group home   Interpersonal Safety N/A    Interventions Advocate/Support    Abnormal to Normal Screening Since Last CN Visit N/A    Screenings CN Performed N/A    Sent Client to Lab for: N/A    Did client attend any of the following based off CNs referral or appointments made? N/A    ED Visit Averted N/A    Life-Saving Intervention Made N/A

## 2022-08-18 ENCOUNTER — Other Ambulatory Visit: Payer: Self-pay

## 2022-08-18 ENCOUNTER — Emergency Department (EMERGENCY_DEPARTMENT_HOSPITAL)
Admission: EM | Admit: 2022-08-18 | Discharge: 2022-08-19 | Disposition: A | Discharge status: Other Acute Inpatient Hospital | Payer: Medicare Other | Source: Home / Self Care | Attending: Emergency Medicine | Admitting: Emergency Medicine

## 2022-08-18 DIAGNOSIS — N179 Acute kidney failure, unspecified: Secondary | ICD-10-CM | POA: Insufficient documentation

## 2022-08-18 DIAGNOSIS — E119 Type 2 diabetes mellitus without complications: Secondary | ICD-10-CM | POA: Insufficient documentation

## 2022-08-18 DIAGNOSIS — F259 Schizoaffective disorder, unspecified: Secondary | ICD-10-CM | POA: Insufficient documentation

## 2022-08-18 DIAGNOSIS — F29 Unspecified psychosis not due to a substance or known physiological condition: Secondary | ICD-10-CM | POA: Diagnosis not present

## 2022-08-18 DIAGNOSIS — F1010 Alcohol abuse, uncomplicated: Secondary | ICD-10-CM | POA: Diagnosis present

## 2022-08-18 DIAGNOSIS — F203 Undifferentiated schizophrenia: Secondary | ICD-10-CM | POA: Diagnosis not present

## 2022-08-18 DIAGNOSIS — I10 Essential (primary) hypertension: Secondary | ICD-10-CM | POA: Insufficient documentation

## 2022-08-18 DIAGNOSIS — R443 Hallucinations, unspecified: Secondary | ICD-10-CM | POA: Diagnosis present

## 2022-08-18 DIAGNOSIS — R45851 Suicidal ideations: Secondary | ICD-10-CM | POA: Insufficient documentation

## 2022-08-18 DIAGNOSIS — F17200 Nicotine dependence, unspecified, uncomplicated: Secondary | ICD-10-CM | POA: Insufficient documentation

## 2022-08-18 DIAGNOSIS — Z1152 Encounter for screening for COVID-19: Secondary | ICD-10-CM | POA: Insufficient documentation

## 2022-08-18 DIAGNOSIS — F23 Brief psychotic disorder: Secondary | ICD-10-CM | POA: Diagnosis present

## 2022-08-18 LAB — ACETAMINOPHEN LEVEL: Acetaminophen (Tylenol), Serum: <10 ug/mL — ABNORMAL LOW (ref 10–30)

## 2022-08-18 LAB — RESP PANEL BY RT-PCR (RSV, FLU A&B, COVID)  RVPGX2
Influenza A by PCR: NEGATIVE
Influenza B by PCR: NEGATIVE
Resp Syncytial Virus by PCR: NEGATIVE
SARS Coronavirus 2 by RT PCR: NEGATIVE

## 2022-08-18 LAB — COMPREHENSIVE METABOLIC PANEL
ALT: 20 U/L (ref 0–44)
AST: 18 U/L (ref 15–41)
Albumin: 4.4 g/dL (ref 3.5–5.0)
Alkaline Phosphatase: 56 U/L (ref 38–126)
Anion gap: 8 (ref 5–15)
BUN: 31 mg/dL — ABNORMAL HIGH (ref 6–20)
CO2: 22 mmol/L (ref 22–32)
Calcium: 9.3 mg/dL (ref 8.9–10.3)
Chloride: 109 mmol/L (ref 98–111)
Creatinine, Ser: 1.44 mg/dL — ABNORMAL HIGH (ref 0.61–1.24)
GFR, Estimated: 60 mL/min — ABNORMAL LOW (ref >60)
Glucose, Bld: 127 mg/dL — ABNORMAL HIGH (ref 70–99)
Potassium: 3.9 mmol/L (ref 3.5–5.1)
Sodium: 139 mmol/L (ref 135–145)
Total Bilirubin: 0.7 mg/dL (ref 0.3–1.2)
Total Protein: 6.7 g/dL (ref 6.5–8.1)

## 2022-08-18 LAB — URINE DRUG SCREEN, QUALITATIVE (ARMC ONLY)
Amphetamines, Ur Screen: NOT DETECTED
Barbiturates, Ur Screen: NOT DETECTED
Benzodiazepine, Ur Scrn: NOT DETECTED
Cannabinoid 50 Ng, Ur ~~LOC~~: POSITIVE — AB
Cocaine Metabolite,Ur ~~LOC~~: NOT DETECTED
MDMA (Ecstasy)Ur Screen: NOT DETECTED
Methadone Scn, Ur: NOT DETECTED
Opiate, Ur Screen: NOT DETECTED
Phencyclidine (PCP) Ur S: NOT DETECTED
Tricyclic, Ur Screen: NOT DETECTED

## 2022-08-18 LAB — CBC
HCT: 39.2 % (ref 39.0–52.0)
Hemoglobin: 13.3 g/dL (ref 13.0–17.0)
MCH: 31.2 pg (ref 26.0–34.0)
MCHC: 33.9 g/dL (ref 30.0–36.0)
MCV: 92 fL (ref 80.0–100.0)
Platelets: 225 10*3/uL (ref 150–400)
RBC: 4.26 MIL/uL (ref 4.22–5.81)
RDW: 13.2 % (ref 11.5–15.5)
WBC: 4.9 10*3/uL (ref 4.0–10.5)
nRBC: 0 % (ref 0.0–0.2)

## 2022-08-18 LAB — ETHANOL: Alcohol, Ethyl (B): <10 mg/dL (ref <10)

## 2022-08-18 LAB — SALICYLATE LEVEL: Salicylate Lvl: <7 mg/dL — ABNORMAL LOW (ref 7.0–30.0)

## 2022-08-18 NOTE — ED Notes (Signed)
Pt dressed out into burgundy scrubs by this RN and tech, tracey. Belongings placed in belongings bag Red [employer]idas shoes Blue jeans Blue boxers\ [employer] belt Black socks Orange sweater [employer]

## 2022-08-18 NOTE — ED Notes (Signed)
IVC prior to arrival/ Consult ordered/Pending

## 2022-08-18 NOTE — ED Notes (Signed)
Pt noted to come to the door to security and officer and state "what happens if the devil were to take over". Pt then asked where the restroom was, went to use the restroom and then returned to room.
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
## 2022-08-18 NOTE — ED Provider Notes (Signed)
[facility] Provider Note    Event Date/Time   First MD Initiated Contact with Patient 08/18/22 1953     (approximate)   History   Psychiatric Evaluation   HPI  Lance [name] is a 47 y.o. male   Past medical history of diabetes, hypertension hyperlipidemia, schizoaffective, [facility] who presents to the emergency department under IVC from [facility] for psychiatric evaluation having suicidal ideation and auditory hallucinations reportedly saying that he is meeting God and [place] and the devil.  He was IVC by his facility prior to arrival.  The patient has no other acute medical complaints.  He has been compliant with his medications and smokes weed earlier today.  He denies self-harm or injuries.  He does have suicidal ideation but had no attempts today.  He does have occasional homicidal ideations as well.  He states that he does have hallucinations and "revelations" and after asking me if I am from [place], reveals that he will "rise up from out of [place]"  Other than smoking weed he denies any other drugs or coingestions or alcohol use.  He denies any injuries.   External Medical Documents Reviewed: Emergency department visit dated July 09, 2022 for hallucinations seen by psychiatry and discharged      Physical Exam   Triage Vital Signs: ED Triage Vitals  Enc Vitals Group     BP 08/18/22 1930 125/63     Pulse Rate 08/18/22 1930 78     Resp 08/18/22 1930 20     Temp 08/18/22 1930 97.9 F (36.6 C)     Temp Source 08/18/22 1930 Oral     SpO2 08/18/22 1930 98 %     Weight 08/18/22 1930 175 lb (79.4 kg)     Height 08/18/22 1930 '5\' 10"'$  (1.778 m)     Head Circumference --      Peak Flow --      Pain Score 08/18/22 1940 0     Pain Loc --      Pain Edu? --      Excl. in [place]? --     Most recent vital signs: Vitals:   08/18/22 1930  BP: 125/63  Pulse: 78  Resp: 20  Temp: 97.9 F (36.6 C)  SpO2: 98%    General: Awake, no distress.  CV:  Good  peripheral perfusion.  Resp:  Normal effort.  Abd:  No distention.  Other:  Cooperative.  Moving all extremities comfortable appearing nontoxic neck supple with full range of motion abdomen soft and nontender vital signs normal does not appear acutely toxidrome neck or in withdrawal   ED Results / Procedures / Treatments   Labs (all labs ordered are listed, but only abnormal results are displayed) Labs Reviewed  COMPREHENSIVE METABOLIC PANEL - Abnormal; Notable for the following components:      Result Value   Glucose, Bld 127 (*)    BUN 31 (*)    Creatinine, Ser 1.44 (*)    GFR, Estimated 60 (*)    All other components within normal limits  SALICYLATE LEVEL - Abnormal; Notable for the following components:   Salicylate Lvl [id number] (*)    All other components within normal limits  ACETAMINOPHEN LEVEL - Abnormal; Notable for the following components:   Acetaminophen (Tylenol), Serum <10 (*)    All other components within normal limits  URINE DRUG SCREEN, QUALITATIVE ([facility] ONLY) - Abnormal; Notable for the following components:   Cannabinoid 50 Ng, Ur [place] POSITIVE (*)  All other components within normal limits  RESP PANEL BY RT-PCR (RSV, FLU A&B, COVID)  [id number]  ETHANOL  CBC     I ordered and reviewed the above labs they are notable for positive for cannabinoids, creatinine is baseline   PROCEDURES:  Critical Care performed: No  Procedures   MEDICATIONS ORDERED IN ED: Medications - No data to display   IMPRESSION / MDM / [place] / ED COURSE  I reviewed the triage vital signs and the nursing notes.                                Patient's presentation is most consistent with acute presentation with potential threat to life or bodily function.  Differential diagnosis includes, but is not limited to, acute decompensated psychiatric illness, toxidrome, fracture and electrolyte disturbance, consider less likely trauma  The patient is on the cardiac monitor  to evaluate for evidence of arrhythmia and/or significant heart rate changes.  MDM: There is a patient with psychiatric illness history under IVC from outside facility for psychiatric evaluation.  He denies any medical complaints and otherwise looks well with normal basic labs and toxicologic labs.  Cleared for psychiatric evaluation.        FINAL CLINICAL IMPRESSION(S) / ED DIAGNOSES   Final diagnoses:  Suicidal ideation  Psychosis, unspecified psychosis type ([place])     Rx / DC Orders   ED Discharge Orders     None        Note:  This document was prepared using Dragon voice recognition software and may include unintentional dictation errors.    Lucillie Garfinkel, MD 08/18/22 2041

## 2022-08-18 NOTE — BH Assessment (Signed)
This Probation officer spoke with pt's legal guardian Timoteo Ace (Legal Guardian) [phone]  to notify her of pt's arrival.

## 2022-08-18 NOTE — ED Triage Notes (Signed)
Pt to ED via [facility]PD from [facility] for psych eval. Pt reports having SI, states "suicide is the only thing to get me to where I need to be". Pt also reports visual/auditory hallucinations, reports meeting God, [place], and the Devil. Pt denies any HI, SOB, CP. Pt calm and cooperative in triage.

## 2022-08-19 ENCOUNTER — Inpatient Hospital Stay
Admission: AD | Admit: 2022-08-19 | Discharge: 2022-08-25 | DRG: 885 | Disposition: A | Discharge status: Home / Self Care | Payer: Medicare Other | Source: Intra-hospital | Attending: Psychiatry | Admitting: Psychiatry

## 2022-08-19 ENCOUNTER — Encounter: Payer: Self-pay | Admitting: Psychiatry

## 2022-08-19 DIAGNOSIS — I10 Essential (primary) hypertension: Secondary | ICD-10-CM | POA: Diagnosis present

## 2022-08-19 DIAGNOSIS — F29 Unspecified psychosis not due to a substance or known physiological condition: Secondary | ICD-10-CM | POA: Diagnosis not present

## 2022-08-19 DIAGNOSIS — Z86711 Personal history of pulmonary embolism: Secondary | ICD-10-CM | POA: Diagnosis not present

## 2022-08-19 DIAGNOSIS — Z794 Long term (current) use of insulin: Secondary | ICD-10-CM

## 2022-08-19 DIAGNOSIS — F1010 Alcohol abuse, uncomplicated: Secondary | ICD-10-CM | POA: Diagnosis present

## 2022-08-19 DIAGNOSIS — R45851 Suicidal ideations: Secondary | ICD-10-CM | POA: Diagnosis present

## 2022-08-19 DIAGNOSIS — Z5982 Transportation insecurity: Secondary | ICD-10-CM

## 2022-08-19 DIAGNOSIS — Z20822 Contact with and (suspected) exposure to covid-19: Secondary | ICD-10-CM | POA: Diagnosis present

## 2022-08-19 DIAGNOSIS — E785 Hyperlipidemia, unspecified: Secondary | ICD-10-CM | POA: Diagnosis present

## 2022-08-19 DIAGNOSIS — F17200 Nicotine dependence, unspecified, uncomplicated: Secondary | ICD-10-CM | POA: Diagnosis present

## 2022-08-19 DIAGNOSIS — N179 Acute kidney failure, unspecified: Secondary | ICD-10-CM | POA: Diagnosis present

## 2022-08-19 DIAGNOSIS — R45850 Homicidal ideations: Secondary | ICD-10-CM | POA: Diagnosis present

## 2022-08-19 DIAGNOSIS — D6862 Lupus anticoagulant syndrome: Secondary | ICD-10-CM | POA: Diagnosis present

## 2022-08-19 DIAGNOSIS — G4700 Insomnia, unspecified: Secondary | ICD-10-CM | POA: Diagnosis present

## 2022-08-19 DIAGNOSIS — Z7951 Long term (current) use of inhaled steroids: Secondary | ICD-10-CM

## 2022-08-19 DIAGNOSIS — Z7901 Long term (current) use of anticoagulants: Secondary | ICD-10-CM

## 2022-08-19 DIAGNOSIS — F203 Undifferentiated schizophrenia: Secondary | ICD-10-CM | POA: Diagnosis present

## 2022-08-19 DIAGNOSIS — Z7984 Long term (current) use of oral hypoglycemic drugs: Secondary | ICD-10-CM

## 2022-08-19 DIAGNOSIS — F17210 Nicotine dependence, cigarettes, uncomplicated: Secondary | ICD-10-CM | POA: Diagnosis present

## 2022-08-19 DIAGNOSIS — E119 Type 2 diabetes mellitus without complications: Secondary | ICD-10-CM

## 2022-08-19 LAB — CBC WITH DIFFERENTIAL/PLATELET
Abs Immature Granulocytes: 0.02 10*3/uL (ref 0.00–0.07)
Basophils Absolute: 0 10*3/uL (ref 0.0–0.1)
Basophils Relative: 0 %
Eosinophils Absolute: 0 10*3/uL (ref 0.0–0.5)
Eosinophils Relative: 0 %
HCT: 42 % (ref 39.0–52.0)
Hemoglobin: 14.2 g/dL (ref 13.0–17.0)
Immature Granulocytes: 0 %
Lymphocytes Relative: 31 %
Lymphs Abs: 1.4 10*3/uL (ref 0.7–4.0)
MCH: 30.5 pg (ref 26.0–34.0)
MCHC: 33.8 g/dL (ref 30.0–36.0)
MCV: 90.1 fL (ref 80.0–100.0)
Monocytes Absolute: 0.6 10*3/uL (ref 0.1–1.0)
Monocytes Relative: 14 %
Neutro Abs: 2.6 10*3/uL (ref 1.7–7.7)
Neutrophils Relative %: 55 %
Platelets: 225 10*3/uL (ref 150–400)
RBC: 4.66 MIL/uL (ref 4.22–5.81)
RDW: 13.3 % (ref 11.5–15.5)
WBC: 4.6 10*3/uL (ref 4.0–10.5)
nRBC: 0 % (ref 0.0–0.2)

## 2022-08-19 LAB — GLUCOSE, CAPILLARY: Glucose-Capillary: 136 mg/dL — ABNORMAL HIGH (ref 70–99)

## 2022-08-19 MED ORDER — OLANZAPINE 10 MG PO TABS
20.0000 mg | ORAL_TABLET | Freq: Every day | ORAL | Status: DC
  Administered 2022-08-19 – 2022-08-24 (×6): 20 mg via ORAL
  Filled 2022-08-19 (×5): qty 2

## 2022-08-19 MED ORDER — DIPHENHYDRAMINE HCL 50 MG/ML IJ SOLN: 50.0000 mg | Freq: Two times a day (BID) | INTRAMUSCULAR | Status: DC | PRN

## 2022-08-19 MED ORDER — HALOPERIDOL 5 MG PO TABS
5.0000 mg | ORAL_TABLET | Freq: Two times a day (BID) | ORAL | Status: DC
  Administered 2022-08-19: 5 mg via ORAL
  Filled 2022-08-19: qty 1

## 2022-08-19 MED ORDER — LITHIUM CARBONATE ER 450 MG PO TBCR
450.0000 mg | EXTENDED_RELEASE_TABLET | Freq: Every day | ORAL | Status: DC
  Administered 2022-08-19 – 2022-08-24 (×6): 450 mg via ORAL
  Filled 2022-08-19 (×5): qty 1

## 2022-08-19 MED ORDER — LISINOPRIL 5 MG PO TABS
2.5000 mg | ORAL_TABLET | Freq: Every day | ORAL | Status: DC
  Administered 2022-08-20 – 2022-08-25 (×4): 2.5 mg via ORAL
  Filled 2022-08-19 (×6): qty 1

## 2022-08-19 MED ORDER — SIMVASTATIN 40 MG PO TABS
40.0000 mg | ORAL_TABLET | Freq: Every day | ORAL | Status: DC
  Administered 2022-08-19 – 2022-08-24 (×6): 40 mg via ORAL
  Filled 2022-08-19 (×6): qty 1

## 2022-08-19 MED ORDER — MAGNESIUM HYDROXIDE 400 MG/5ML PO SUSP: 30.0000 mL | Freq: Every day | ORAL | Status: DC | PRN

## 2022-08-19 MED ORDER — MOMETASONE FURO-FORMOTEROL FUM 200-5 MCG/ACT IN AERO
2.0000 | INHALATION_SPRAY | Freq: Two times a day (BID) | RESPIRATORY_TRACT | Status: DC
  Administered 2022-08-19 – 2022-08-24 (×10): 2 via RESPIRATORY_TRACT
  Filled 2022-08-19: qty 8.8

## 2022-08-19 MED ORDER — NICOTINE POLACRILEX 2 MG MT GUM
2.0000 mg | CHEWING_GUM | OROMUCOSAL | Status: DC | PRN
  Administered 2022-08-19 – 2022-08-25 (×14): 2 mg via ORAL
  Filled 2022-08-19 (×10): qty 1

## 2022-08-19 MED ORDER — DIPHENHYDRAMINE HCL 25 MG PO CAPS: 50.0000 mg | ORAL_CAPSULE | Freq: Two times a day (BID) | ORAL | Status: AC | PRN

## 2022-08-19 MED ORDER — LORAZEPAM 2 MG PO TABS: 2.0000 mg | ORAL_TABLET | Freq: Two times a day (BID) | ORAL | Status: AC | PRN

## 2022-08-19 MED ORDER — HALOPERIDOL 5 MG PO TABS: 5.0000 mg | ORAL_TABLET | Freq: Two times a day (BID) | ORAL | Status: AC | PRN

## 2022-08-19 MED ORDER — HALOPERIDOL LACTATE 5 MG/ML IJ SOLN: 5.0000 mg | Freq: Two times a day (BID) | INTRAMUSCULAR | Status: AC | PRN

## 2022-08-19 MED ORDER — DIPHENHYDRAMINE HCL 50 MG/ML IJ SOLN: 50.0000 mg | Freq: Two times a day (BID) | INTRAMUSCULAR | Status: AC | PRN

## 2022-08-19 MED ORDER — LORAZEPAM 2 MG/ML IJ SOLN: 2.0000 mg | Freq: Two times a day (BID) | INTRAMUSCULAR | Status: DC

## 2022-08-19 MED ORDER — LORAZEPAM 2 MG PO TABS
2.0000 mg | ORAL_TABLET | Freq: Two times a day (BID) | ORAL | Status: DC
  Administered 2022-08-19: 2 mg via ORAL
  Filled 2022-08-19: qty 1

## 2022-08-19 MED ORDER — TRAZODONE HCL 100 MG PO TABS
100.0000 mg | ORAL_TABLET | Freq: Every day | ORAL | Status: DC
  Administered 2022-08-19 – 2022-08-24 (×6): 100 mg via ORAL
  Filled 2022-08-19 (×5): qty 1

## 2022-08-19 MED ORDER — PROPRANOLOL HCL 20 MG PO TABS
20.0000 mg | ORAL_TABLET | Freq: Every day | ORAL | Status: DC
  Administered 2022-08-20 – 2022-08-24 (×5): 20 mg via ORAL
  Filled 2022-08-19 (×5): qty 1

## 2022-08-19 MED ORDER — HALOPERIDOL LACTATE 5 MG/ML IJ SOLN: 5.0000 mg | Freq: Two times a day (BID) | INTRAMUSCULAR | Status: DC

## 2022-08-19 MED ORDER — ALBUTEROL SULFATE HFA 108 (90 BASE) MCG/ACT IN AERS: 2.0000 | INHALATION_SPRAY | Freq: Four times a day (QID) | RESPIRATORY_TRACT | Status: DC | PRN

## 2022-08-19 MED ORDER — ALUM & MAG HYDROXIDE-SIMETH 200-200-20 MG/5ML PO SUSP: 30.0000 mL | ORAL | Status: DC | PRN

## 2022-08-19 MED ORDER — CLOZAPINE 25 MG PO TABS
250.0000 mg | ORAL_TABLET | Freq: Every day | ORAL | Status: DC
  Administered 2022-08-19 – 2022-08-24 (×6): 250 mg via ORAL
  Filled 2022-08-19 (×6): qty 2

## 2022-08-19 MED ORDER — SERTRALINE HCL 100 MG PO TABS
100.0000 mg | ORAL_TABLET | Freq: Every day | ORAL | Status: DC
  Administered 2022-08-19 – 2022-08-25 (×7): 100 mg via ORAL
  Filled 2022-08-19 (×6): qty 1

## 2022-08-19 MED ORDER — METFORMIN HCL 500 MG PO TABS
1000.0000 mg | ORAL_TABLET | Freq: Two times a day (BID) | ORAL | Status: DC
  Administered 2022-08-19 – 2022-08-25 (×12): 1000 mg via ORAL
  Filled 2022-08-19 (×14): qty 2

## 2022-08-19 MED ORDER — LORAZEPAM 2 MG/ML IJ SOLN: 2.0000 mg | Freq: Two times a day (BID) | INTRAMUSCULAR | Status: AC | PRN

## 2022-08-19 MED ORDER — DIPHENHYDRAMINE HCL 25 MG PO CAPS: 50.0000 mg | ORAL_CAPSULE | Freq: Two times a day (BID) | ORAL | Status: DC | PRN

## 2022-08-19 MED ORDER — ACETAMINOPHEN 325 MG PO TABS: 650.0000 mg | ORAL_TABLET | Freq: Four times a day (QID) | ORAL | Status: DC | PRN

## 2022-08-19 MED ORDER — INSULIN DETEMIR 100 UNIT/ML ~~LOC~~ SOLN
10.0000 [IU] | Freq: Two times a day (BID) | SUBCUTANEOUS | Status: DC
  Administered 2022-08-19 – 2022-08-25 (×12): 10 [IU] via SUBCUTANEOUS
  Filled 2022-08-19 (×13): qty 0.1

## 2022-08-19 MED ORDER — CLONAZEPAM 0.25 MG PO TBDP: 0.2500 mg | ORAL_TABLET | Freq: Two times a day (BID) | ORAL | Status: DC | PRN

## 2022-08-19 MED ORDER — APIXABAN 5 MG PO TABS
5.0000 mg | ORAL_TABLET | Freq: Two times a day (BID) | ORAL | Status: DC
  Administered 2022-08-19 – 2022-08-25 (×12): 5 mg via ORAL
  Filled 2022-08-19 (×13): qty 1

## 2022-08-19 NOTE — Progress Notes (Signed)
Patient calm and pleasant during assessment denying SI/HI/AVH. Pt stated he needed a break from his group home and is hopeful he will get a new place. Pt observed interacting appropriately with staff and peers on the unit by this Probation officer. Pt compliant with medication administration per MD orders. Pt given education, support, and encouragement to be active in his treatment plan. Pt being monitored Q 15 minutes for safety per unit protocol, remains safe on the unit

## 2022-08-19 NOTE — ED Provider Notes (Signed)
Emergency Medicine Observation Re-evaluation Note  Lance [name] is a 47 y.o. male, seen on rounds today.  Pt initially presented to the ED for complaints of Psychiatric Evaluation Currently, the patient is resting, voices no medical complaints.  Physical Exam  BP 125/63 (BP Location: Left Arm)   Pulse 78   Temp 97.9 F (36.6 C) (Oral)   Resp 20   Ht '5\' 10"'$  (1.778 m)   Wt 79.4 kg   SpO2 98%   BMI 25.11 kg/m  Physical Exam General: Resting in no acute distress Cardiac: No cyanosis Lungs: Equal rise and fall Psych: Not agitated  ED Course / MDM  EKG:   I have reviewed the labs performed to date as well as medications administered while in observation.  Recent changes in the last 24 hours include no events overnight.  Plan  Current plan is for psychiatric disposition.    Paulette Blanch, MD 08/19/22 [phone]

## 2022-08-19 NOTE — Progress Notes (Signed)
Admissions note: Pt stated that he was having negative thoughts and started to make him delusional so he felt like he better come in to get help. ED reported that he came due to SI also but pt denies  SI/HI. Pt stated that he sees Chartered loss adjuster. Pt stated that his stressor is thinking about religious stuff. Pt is hyper-religious. Pt stated while getting meds, "I am a demon. The black demon of death." Pt stated, "did you know that ou can die spiritually but not bodily?" Pt stated that his two goals were to get rest and "stabilize my mind to the point of staying outside longer instead of coming back here so much." Pt stated that the group home that he is in, is in the process of getting him to another one. Consents signed, handbook detailing the patient's rights, responsibilities, and visitor guidelines provided. Skin/belongings search completed and patient oriented to unit. Patient stable at this time. Patient given the opportunity to express concerns and ask questions. Patient given toiletries. RN will continue to monitor.   08/19/22 1055  Psych Admission Type (Psych Patients Only)  Admission Status Involuntary  Psychosocial Assessment  Patient Complaints Anxiety;Confusion;Depression;Panic attack;Hyperactivity;Sleep disturbance  Eye Contact Fair  Facial Expression Flat  Affect Appropriate to circumstance  Speech Logical/coherent  Interaction Assertive  Motor Activity Slow  Appearance/Hygiene Unremarkable  Behavior Characteristics Cooperative;Appropriate to situation;Anxious  Mood Anxious;Depressed;Pleasant  Aggressive Behavior  Effect No apparent injury  Thought Process  Coherency WDL  Content Religiosity  Delusions Religious  Perception Hallucinations  Hallucination Visual  Judgment Limited  Confusion None  Danger to Self  Current suicidal ideation? Denies  Danger to Others  Danger to Others None reported or observed

## 2022-08-19 NOTE — ED Notes (Addendum)
Report to Bella Kennedy, RN in [facility] BMU  Sent to [facility] BMU via wheelchair with security and all of belongings. Appropriate paperwork sent at well.

## 2022-08-19 NOTE — Group Note (Signed)
Date:  08/19/2022 Time:  11:51 PM  Group Topic/Focus:  Making Healthy Choices:   The focus of this group is to help patients identify negative/unhealthy choices they were using prior to admission and identify positive/healthier coping strategies to replace them upon discharge. Wrap-Up Group:   The focus of this group is to help patients review their daily goal of treatment and discuss progress on daily workbooks.    Participation Level:  Minimal  Participation Quality:  Appropriate  Affect:  Appropriate  Cognitive:  Alert and Appropriate  Insight: Appropriate and Good  Engagement in Group:  Engaged and Limited  Modes of Intervention:  Discussion  Additional Comments:    Ladona Mow 08/19/2022, 11:51 PM

## 2022-08-19 NOTE — ED Notes (Signed)
Pt requested shower; provided clean hospital clothing and linens.  Shower setup provided with soap, shampoo, toothbrush/toothpaste, and deodorant.  Pt able to preform own ADL's with no assistance.

## 2022-08-19 NOTE — Group Note (Signed)
Recreation Therapy Group Note   Group Topic:Other  Group Date: 08/19/2022 Start Time: 1000 End Time: 1001 Facilitators: Vilma Prader, LRT, CTRS Location:  N/A  Group was not held due to LRT attending Fit Testing Trainer Class.    Vilma Prader, LRT, CTRS 08/19/2022 11:30 AM

## 2022-08-19 NOTE — Tx Team (Signed)
Initial Treatment Plan 08/19/2022 11:33 AM [name] [id number]    PATIENT STRESSORS: Medication change or noncompliance   Other: "Religious stuff"     PATIENT STRENGTHS: Ability for insight  Motivation for treatment/growth  Religious Affiliation    PATIENT IDENTIFIED PROBLEMS: Negative/SI thoughts  AVH  Depression/anxiety  "Thinking about religious stuff"               DISCHARGE CRITERIA:  Improved stabilization in mood, thinking, and/or behavior Verbal commitment to aftercare and medication compliance  PRELIMINARY DISCHARGE PLAN: Outpatient therapy Return to previous living arrangement  PATIENT/FAMILY INVOLVEMENT: This treatment plan has been presented to and reviewed with the patient, Keison Simich.  The patient has been given the opportunity to ask questions and make suggestions.  Gerrianne Scale, RN 08/19/2022, 11:33 AM

## 2022-08-19 NOTE — BH Assessment (Signed)
Patient is to be admitted to [name] today 08/19/22 by Dr. Weber Cooks.  Attending Physician will be Dr.  Weber Cooks .   Patient has been assigned to room 306, by [name] Charge Nurse, Bella Kennedy.    ER staff is aware of the admission: Luann, ER Secretary   Dr. Quentin Cornwall, ER MD  Radonna Ricker, Patient's Nurse

## 2022-08-19 NOTE — ED Notes (Signed)
Timoteo Ace Legal [phone]) Legal guardian   Left HIPAA compliant VM.

## 2022-08-19 NOTE — Consult Note (Signed)
[facility]iat[facility] Consult   Reason for Consult: Psychiatric Evaluation  Referring Physician:  Dr. Jacelyn Grip Patient Identification: Lance [name] MRN:  [id number] Principal Diagnosis: <principal problem not specified> Diagnosis:  Active Problems:   Diabetes mellitus without complication ([place])   Acute psychosis ([place])   Alcohol abuse   Hallucinations   AKI (acute kidney injury) ([place])   Total Time spent with patient: 1 hour  Subjective:   Lance [name] is a 47 y.o. male patient presented to [facility] ED via law enforcement under involuntary commitment status (IVC). The patient presented to [facility] under IVC from [place]. Per triage note: Pt to ED via [employer] from [place] for psych eval. Pt reports having SI, states "suicide is the only thing to get me to where I need to be". Pt also reports visual/auditory hallucinations, reports meeting God, [place], and the Devil.  When asked why he'd presented to the ER, the patient stated, "The disturbing thoughts got ridiculous, and I had to come to talk to someone." The patient endorsed having intrusive thoughts that are hyper-religious. The patient presented with an anxious mood and a congruent affect.  This provider saw the patient face-to-face, reviewed the chart, and consulted with Dr. Jacelyn Grip on 08/18/2022 due to the patient's care. Both providers discussed the fact that the patient meets the criteria for admission to the psychiatric inpatient unit.  Upon evaluation, the patient is alert and oriented x 4, calm, cooperative, and mood-congruent with affect. The patient does not appear to be responding to internal or external stimuli. The patient is presenting with some delusional thinking. The patient admits to auditory and visual hallucinations. The patient admits to any suicidal, homicidal, or self-harm ideations. The patient is not presenting with any psychotic or paranoid behaviors.     HPI:  Dr. Jacelyn Grip, Lance [name] is a 47 y.o. male   Past medical history of  diabetes, hypertension hyperlipidemia, schizoaffective, PE [facility] who presents to the emergency department under IVC from [place] for psychiatric evaluation having suicidal ideation and auditory hallucinations reportedly saying that he is meeting God and [place] and the devil.  He was IVC by his facility prior to arrival.   The patient has no other acute medical complaints.  He has been compliant with his medications and smokes weed earlier today.  He denies self-harm or injuries.  He does have suicidal ideation but had no attempts today.  He does have occasional homicidal ideations as well.  He states that he does have hallucinations and "revelations" and after asking me if I am from [place], reveals that he will "rise up from out of [place]"   Other than smoking weed he denies any other drugs or coingestions or alcohol use.  He denies any injuries.     External Medical Documents Reviewed: Emergency department visit dated July 09, 2022 for hallucinations seen by psychiatry and discharged  Past Psychiatric History:  Depression Schizo affective schizophrenia ([place])   Risk to Self:   Risk to Others:   Prior Inpatient Therapy:   Prior Outpatient Therapy:    Past Medical History:  Past Medical History:  Diagnosis Date   Depression    Diabetes mellitus without complication ([place])    Hyperlipidemia    Hypertension    Lupus anticoagulant disorder ([place]) 06/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   PE (pulmonary thromboembolism) ([place])    Schizo affective schizophrenia ([place])    Supratherapeutic INR 11/19/2016   History reviewed. No pertinent surgical history. Family History:  Family History  Problem Relation Age of Onset   CAD Mother    CAD Sister    Family Psychiatric  History: History reviewed. No pertinent family psychiatric history Social History:  Social History   Substance and Sexual Activity  Alcohol Use Not Currently   Comment: occassionally     Social History   Substance and  Sexual Activity  Drug Use Yes   Types: Marijuana   Comment: Last use 11/29/16    Social History   Socioeconomic History   Marital status: Single    Spouse name: Not on file   Number of children: Not on file   Years of education: Not on file   Highest education level: Not on file  Occupational History   Not on file  Tobacco Use   Smoking status: Every Day    Packs/day: 1.00    Years: 23.00    Total pack years: 23.00    Types: Cigarettes   Smokeless tobacco: Never   Tobacco comments:    will provide material  Substance and Sexual Activity   Alcohol use: Not Currently    Comment: occassionally   Drug use: Yes    Types: Marijuana    Comment: Last use 11/29/16   Sexual activity: Not Currently    Birth control/protection: None    Comment: occasional marijuana- none recently  Other Topics Concern   Not on file  Social History Narrative   From a group home in [place] Strain: Low Risk  (03/20/2021)   Overall Financial Resource Strain (CARDIA)    Difficulty of Paying Living Expenses: Not very hard  Food Insecurity: Food Insecurity Present (06/17/2022)   Hunger Vital Sign    Worried About Running Out of Food in the Last Year: Sometimes true    Ran Out of Food in the Last Year: Sometimes true  Transportation Needs: Unmet Transportation Needs (06/17/2022)   PRAPARE - Transportation    Lack of Transportation (Medical): Yes    Lack of Transportation (Non-Medical): Yes  Physical Activity: Insufficiently Active (03/20/2021)   Exercise Vital Sign    Days of Exercise per Week: 3 days    Minutes of Exercise per Session: 20 min  Stress: No Stress Concern Present (03/20/2021)   [employer] [place]    Feeling of Stress : Only a little  Social Connections: Socially Isolated (03/20/2021)   Social Connection and Isolation Panel [NHANES]    Frequency of Communication with Friends  and Family: More than three times a week    Frequency of Social Gatherings with Friends and Family: More than three times a week    Attends Religious Services: Never    Marine scientist or Organizations: No    Attends Archivist Meetings: Never    Marital Status: Never married   Additional Social History:    Allergies:   Allergies  Allergen Reactions   Penicillins Other (See Comments)    Reaction: "lockjaw" as teen Tolerated Augmentin/Unasyn 06/09/22      Labs:  Results for orders placed or performed during the hospital encounter of 08/18/22 (from the past 48 hour(s))  Comprehensive metabolic panel     Status: Abnormal   Collection Time: 08/18/22  7:31 PM  Result Value Ref Range   Sodium 139 135 - 145 mmol/L   Potassium 3.9 3.5 - 5.1 mmol/L   Chloride 109 98 - 111 mmol/L   CO2 22 22 -  32 mmol/L   Glucose, Bld 127 (H) 70 - 99 mg/dL    Comment: Glucose reference range applies only to samples taken after fasting for at least 8 hours.   BUN 31 (H) 6 - 20 mg/dL   Creatinine, Ser 1.44 (H) 0.61 - 1.24 mg/dL   Calcium 9.3 8.9 - 10.3 mg/dL   Total Protein 6.7 6.5 - 8.1 g/dL   Albumin 4.4 3.5 - 5.0 g/dL   AST 18 15 - 41 U/L   ALT 20 0 - 44 U/L   Alkaline Phosphatase 56 38 - 126 U/L   Total Bilirubin 0.7 0.3 - 1.2 mg/dL   GFR, Estimated 60 (L) >60 mL/min    Comment: (NOTE) Calculated using the CKD-EPI Creatinine Equation (2021)    Anion gap 8 5 - 15    Comment: Performed at [facility], [address] [place], [place] [address]  Ethanol     Status: None   Collection Time: 08/18/22  7:31 PM  Result Value Ref Range   Alcohol, Ethyl (B) <10 <10 mg/dL    Comment: (NOTE) Lowest detectable limit for serum alcohol is 10 mg/dL.  For medical purposes only. Performed at [facility], [address] [place], [place] [id number]   Salicylate level     Status: Abnormal   Collection Time: 08/18/22  7:31 PM  Result Value Ref Range   Salicylate  Lvl [id number] (L) 7.0 - 30.0 mg/dL    Comment: Performed at [facility], [address] [place], [place] [address]  Acetaminophen level     Status: Abnormal   Collection Time: 08/18/22  7:31 PM  Result Value Ref Range   Acetaminophen (Tylenol), Serum <10 (L) 10 - 30 ug/mL    Comment: (NOTE) Therapeutic concentrations vary significantly. A range of 10-30 ug/mL  may be an effective concentration for many patients. However, some  are best treated at concentrations outside of this range. Acetaminophen concentrations >150 ug/mL at 4 hours after ingestion  and >50 ug/mL at 12 hours after ingestion are often associated with  toxic reactions.  Performed at [facility], [address] [place], [place] [address]   cbc     Status: None   Collection Time: 08/18/22  7:31 PM  Result Value Ref Range   WBC 4.9 4.0 - 10.5 K/uL   RBC 4.26 4.22 - 5.81 MIL/uL   Hemoglobin 13.3 13.0 - 17.0 g/dL   HCT 39.2 39.0 - 52.0 %   MCV 92.0 80.0 - 100.0 fL   MCH 31.2 26.0 - 34.0 pg   MCHC 33.9 30.0 - 36.0 g/dL   RDW 13.2 11.5 - 15.5 %   Platelets 225 150 - 400 K/uL   nRBC 0.0 0.0 - 0.2 %    Comment: Performed at [facility], [address] [place],  [address]  Resp panel by RT-PCR (RSV, Flu A&B, Covid) Anterior Nasal Swab     Status: None   Collection Time: 08/18/22  7:47 PM   Specimen: Anterior Nasal Swab  Result Value Ref Range   SARS Coronavirus 2 by RT PCR NEGATIVE NEGATIVE    Comment: (NOTE) SARS-CoV-2 target nucleic acids are NOT DETECTED.  The SARS-CoV-2 RNA is generally detectable in upper respiratory specimens during the acute phase of infection. The lowest concentration of SARS-CoV-2 viral copies this assay can detect is 138 copies/mL. A negative result does not preclude SARS-Cov-2 infection and should not be used as the sole basis for treatment or other patient  management decisions. A negative result may occur with  improper specimen collection/handling,  submission of specimen other than nasopharyngeal swab, presence of viral mutation(s) within the areas targeted by this assay, and inadequate number of viral copies(<138 copies/mL). A negative result must be combined with clinical observations, patient history, and epidemiological information. The expected result is Negative.  Fact Sheet for Patients:  EntrepreneurPulse.com.au  Fact Sheet for Healthcare Providers:  IncredibleEmployment.be  This test is no t yet approved or cleared by the [place] FDA and  has been authorized for detection and/or diagnosis of SARS-CoV-2 by FDA under an Emergency Use Authorization (EUA). This EUA will remain  in effect (meaning this test can be used) for the duration of the COVID-19 declaration under Section 564(b)(1) of the Act, 21 U.S.C.section 360bbb-3(b)(1), unless the authorization is terminated  or revoked sooner.       Influenza A by PCR NEGATIVE NEGATIVE   Influenza B by PCR NEGATIVE NEGATIVE    Comment: (NOTE) The Xpert Xpress SARS-CoV-2/FLU/RSV plus assay is intended as an aid in the diagnosis of influenza from Nasopharyngeal swab specimens and should not be used as a sole basis for treatment. Nasal washings and aspirates are unacceptable for Xpert Xpress SARS-CoV-2/FLU/RSV testing.  Fact Sheet for Patients: EntrepreneurPulse.com.au  Fact Sheet for Healthcare Providers: IncredibleEmployment.be  This test is not yet approved or cleared by the [place] FDA and has been authorized for detection and/or diagnosis of SARS-CoV-2 by FDA under an Emergency Use Authorization (EUA). This EUA will remain in effect (meaning this test can be used) for the duration of the COVID-19 declaration under Section 564(b)(1) of the Act, 21 U.S.C. section 360bbb-3(b)(1), unless the authorization is terminated or revoked.     Resp Syncytial Virus by PCR NEGATIVE NEGATIVE     Comment: (NOTE) Fact Sheet for Patients: EntrepreneurPulse.com.au  Fact Sheet for Healthcare Providers: IncredibleEmployment.be  This test is not yet approved or cleared by the [place] FDA and has been authorized for detection and/or diagnosis of SARS-CoV-2 by FDA under an Emergency Use Authorization (EUA). This EUA will remain in effect (meaning this test can be used) for the duration of the COVID-19 declaration under Section 564(b)(1) of the Act, 21 U.S.C. section 360bbb-3(b)(1), unless the authorization is terminated or revoked.  Performed at [facility], [address] [place], [place] [address]   Urine Drug Screen, Qualitative     Status: Abnormal   Collection Time: 08/18/22  7:52 PM  Result Value Ref Range   Tricyclic, Ur Screen NONE DETECTED NONE DETECTED   Amphetamines, Ur Screen NONE DETECTED NONE DETECTED   MDMA (Ecstasy)Ur Screen NONE DETECTED NONE DETECTED   Cocaine Metabolite,Ur [place] NONE DETECTED NONE DETECTED   Opiate, Ur Screen NONE DETECTED NONE DETECTED   Phencyclidine (PCP) Ur S NONE DETECTED NONE DETECTED   Cannabinoid 50 Ng, Ur [place] POSITIVE (A) NONE DETECTED   Barbiturates, Ur Screen NONE DETECTED NONE DETECTED   Benzodiazepine, Ur Scrn NONE DETECTED NONE DETECTED   Methadone Scn, Ur NONE DETECTED NONE DETECTED    Comment: (NOTE) Tricyclics + metabolites, urine    Cutoff 1000 ng/mL Amphetamines + metabolites, urine  Cutoff 1000 ng/mL MDMA (Ecstasy), urine              Cutoff 500 ng/mL Cocaine Metabolite, urine          Cutoff 300 ng/mL Opiate + metabolites, urine        Cutoff 300 ng/mL Phencyclidine (PCP), urine  Cutoff 25 ng/mL Cannabinoid, urine                 Cutoff 50 ng/mL Barbiturates + metabolites, urine  Cutoff 200 ng/mL Benzodiazepine, urine              Cutoff 200 ng/mL Methadone, urine                   Cutoff 300 ng/mL  The urine drug screen provides only a preliminary,  unconfirmed analytical test result and should not be used for non-medical purposes. Clinical consideration and professional judgment should be applied to any positive drug screen result due to possible interfering substances. A more specific alternate chemical method must be used in order to obtain a confirmed analytical result. Gas chromatography / mass spectrometry (GC/MS) is the preferred confirm atory method. Performed at [facility], [address] [place], [place] [address]     No current facility-administered medications for this encounter.   Current Outpatient Medications  Medication Sig Dispense Refill   albuterol (VENTOLIN HFA) 108 (90 Base) MCG/ACT inhaler Inhale 1-2 puffs into the lungs every 6 (six) hours as needed for wheezing or shortness of breath. 18 g 1   apixaban (ELIQUIS) 5 MG TABS tablet Take 1 tablet (5 mg total) by mouth 2 (two) times daily. 60 tablet 1   blood glucose meter kit and supplies Dispense based on patient and insurance preference. Use up to four times daily as directed. (FOR ICD-10 E10.9, E11.9). 1 each 6   clonazePAM (KLONOPIN) 0.25 MG disintegrating tablet Take 1 tablet (0.25 mg total) by mouth 2 (two) times daily as needed (anxiety). 60 tablet 1   clozapine (CLOZARIL) 200 MG tablet Take 1 tablet (200 mg total) by mouth at bedtime. 30 tablet 1   fluticasone-salmeterol (ADVAIR) 250-50 MCG/ACT AEPB Inhale 1 puff into the lungs 2 (two) times daily. 60 each 1   glucose blood (FORA V30A BLOOD GLUCOSE TEST) test strip CHECK BLOOD SUGAR TWICE DAILY. 100 strip 3   hydrOXYzine (ATARAX) 50 MG tablet Take 1 tablet (50 mg total) by mouth every 6 (six) hours as needed for anxiety. 60 tablet 1   insulin detemir (LEVEMIR) 100 UNIT/ML injection Inject 0.1 mLs (10 Units total) into the skin 2 (two) times daily. 10 mL 1   lisinopril (ZESTRIL) 2.5 MG tablet Take 1 tablet (2.5 mg total) by mouth daily. 30 tablet 1   lithium carbonate (ESKALITH) 450 MG ER tablet  Take 1 tablet (450 mg total) by mouth at bedtime. 30 tablet 1   metFORMIN (GLUCOPHAGE) 1000 MG tablet Take 1 tablet (1,000 mg total) by mouth 2 (two) times daily with a meal. 60 tablet 1   nicotine polacrilex (NICORETTE) 2 MG gum Take 1 each (2 mg total) by mouth every 2 (two) hours as needed for smoking cessation. 100 tablet 0   NOVOFINE AUTOCOVER PEN NEEDLE 30G X 8 MM MISC USE WITH LEVEMIR TWICE DAILY. 100 each 11   OLANZapine (ZYPREXA) 20 MG tablet Take 1 tablet (20 mg total) by mouth at bedtime. 30 tablet 1   propranolol (INDERAL) 20 MG tablet Take 1 tablet (20 mg total) by mouth at bedtime. 30 tablet 1   sertraline (ZOLOFT) 100 MG tablet Take 1 tablet (100 mg total) by mouth daily. 30 tablet 1   simvastatin (ZOCOR) 40 MG tablet Take 1 tablet (40 mg total) by mouth daily at 6 PM. 30 tablet 1   traZODone (DESYREL) 100 MG tablet Take 1 tablet (  100 mg total) by mouth at bedtime. 30 tablet 1    Musculoskeletal: Strength & Muscle Tone: within normal limits Gait & Station: normal Patient leans: N/A Psychiatric Specialty Exam:  Presentation  General Appearance:  Bizarre  Eye Contact: Minimal  Speech: Pressured  Speech Volume: Increased  Handedness: Right   Mood and Affect  Mood: Anxious; Euphoric; Irritable  Affect: Congruent   Thought Process  Thought Processes: Disorganized  Descriptions of Associations:Loose  Orientation:Full (Time, Place and Person)  Thought Content:Illogical  History of Schizophrenia/Schizoaffective disorder:Yes  Duration of Psychotic Symptoms:Greater than six months  Hallucinations:No data recorded Ideas of Reference:Paranoia  Suicidal Thoughts:No data recorded Homicidal Thoughts:No data recorded  Sensorium  Memory: Immediate Fair; Recent Fair; Remote Fair  Judgment: Poor  Insight: Poor   Executive Functions  Concentration: Poor  Attention Span: Poor  Recall: Poor  Fund of  Knowledge: Poor  Language: Poor   Psychomotor Activity  Psychomotor Activity:No data recorded  Assets  Assets: Communication Skills; Desire for Improvement; Housing; Physical Health; Resilience; Social Support   Sleep  Sleep:No data recorded  Physical Exam: Physical Exam Constitutional:      Appearance: Normal appearance.  HENT:     Head: Normocephalic and atraumatic.     Left Ear: There is impacted cerumen.     Nose: Nose normal.     Mouth/Throat:     Mouth: Mucous membranes are moist.  Cardiovascular:     Rate and Rhythm: Normal rate.  Genitourinary:    Rectum: Normal.  Musculoskeletal:     Cervical back: Normal range of motion.  Neurological:     General: No focal deficit present.     Mental Status: He is alert and oriented to person, place, and time.    Review of Systems  Psychiatric/Behavioral:  Positive for depression. The patient is nervous/anxious.    Blood pressure 125/63, pulse 78, temperature 97.9 F (36.6 C), temperature source Oral, resp. rate 20, height '5\' 10"'$  (1.778 m), weight 79.4 kg, SpO2 98 %. Body mass index is 25.11 kg/m.  Treatment Plan Summary: Plan   Patient does meet the criteria for psychiatric inpatient admission  Disposition: Recommend psychiatric Inpatient admission when medically cleared. Supportive therapy provided about ongoing stressors. Refer to IOP.  Caroline Sauger, NP 08/19/2022 1:41 AM

## 2022-08-19 NOTE — Group Note (Signed)
[facility] LCSW Group Therapy Note   Group Date: 08/19/2022 Start Time: 1300 End Time: 1400   Type of Therapy/Topic:  Group Therapy:  Emotion Regulation  Participation Level:  Minimal   Mood:  Description of Group:    The purpose of this group is to assist patients in learning to regulate negative emotions and experience positive emotions. Patients will be guided to discuss ways in which they have been vulnerable to their negative emotions. These vulnerabilities will be juxtaposed with experiences of positive emotions or situations, and patients challenged to use positive emotions to combat negative ones. Special emphasis will be placed on coping with negative emotions in conflict situations, and patients will process healthy conflict resolution skills.  Therapeutic Goals: Patient will identify two positive emotions or experiences to reflect on in order to balance out negative emotions:  Patient will label two or more emotions that they find the most difficult to experience:  Patient will be able to demonstrate positive conflict resolution skills through discussion or role plays:   Summary of Patient Progress: Patient was present for the entirety of the group process. He only participated in the icebreaker when called on directly and to respond that he is right handed.    Therapeutic Modalities:   Cognitive Behavioral Therapy Feelings Identification Dialectical Behavioral Therapy   Shirl Harris, LCSW

## 2022-08-19 NOTE — ED Notes (Signed)
Hospital meal provided.  100% consumed, pt tolerated w/o complaints.  Waste discarded appropriately.   

## 2022-08-19 NOTE — H&P (Signed)
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  47 year old man with a history of schizophrenia presented to the hospital referred from [place] where he had presented with hyper religious psychosis.  Patient was talking in bizarre ways about religious ideas at [facility] and in the emergency room.  Unclear but he may have said something about suicide.  On interview with me today the patient was pleasant and forthcoming.  He talked at length about some hyper religious themes about God and the devil.  None of it was threatening or agitated.  He also talked quite a bit about his belief that he might be the Chief Executive Officer for [employer].  Talked about some stuff that he and I have discussed in the past like his frustration at not having met many of his goals for life.  Nothing hospital denies suicidal or homicidal ideation.  Does admit that he has been using some marijuana recently.  Drug screen was positive for cannabis.  Says he has been taking his medication recently. Associated Signs/Symptoms: Depression Symptoms:  insomnia, anxiety, (Hypo) Manic Symptoms:  Flight of Ideas, Anxiety Symptoms:   None Psychotic Symptoms:  Delusions, Paranoia, PTSD Symptoms: Negative Total Time spent with patient: 45 minutes  Past Psychiatric History: Patient has a long well-documented history of chronic psychotic disorder and is known very well to our hospital.  He comes to the emergency room fairly regularly and is  often admitted usually staying only a few days before stabilizing.  He lives in a group home most recently.  Has a regular outpatient provider through his ACT team.  Patient is on clozapine plus other antipsychotics but when taking his medicine is reasonably stable although never completely free of his delusions.  He does have a history of some aggression and irritability at times especially when psychotic.  Has a distant history of suicidal behavior.  Distant history of more frequent substance use most recently has not been using substances often.  Is the patient at risk to self? No.  Has the patient been a risk to self in the past 6 months? No.  Has the patient been a risk to self within the distant past? Yes.    Is the patient a risk to others? No.  Has the patient been a risk to others in the past 6 months? Yes.    Has the patient been a risk to others within the distant past? Yes.     [place] Scale:  [place] Admission (Current) from 08/19/2022 in [place] ED from 08/18/2022 in [facility] Emergency Department at [facility] ED from 07/09/2022 in [facility] Emergency Department at [facility] No Risk High Risk Error: Q3, 4, or 5 should not be populated when Q2 is No        Prior Inpatient Therapy: Yes.   If yes, describe multiple hospitalizations Prior Outpatient Therapy: Yes.   If yes, describe has an ACT team.  Alcohol Screening: 1. How often do you have a drink containing alcohol?: Never 2. How many drinks containing alcohol do you have  on a typical day when you are drinking?: 1 or 2 3. How often do you have six or more drinks on one occasion?: Never AUDIT-C Score: 0 4. How often during the last year have you found that you were not able to stop drinking once you had started?: Never 5. How often during the last year have you failed to do what was normally expected from you because of drinking?: Never 6. How often during  the last year have you needed a first drink in the morning to get yourself going after a heavy drinking session?: Never 7. How often during the last year have you had a feeling of guilt of remorse after drinking?: Never 8. How often during the last year have you been unable to remember what happened the night before because you had been drinking?: Never 9. Have you or someone else been injured as a result of your drinking?: No 10. Has a relative or friend or a doctor or another health worker been concerned about your drinking or suggested you cut down?: No Alcohol Use Disorder Identification Test Final Score (AUDIT): 0 Substance Abuse History in the last 12 months:  Yes.   Consequences of Substance Abuse: Appears to have been using cannabis again recently.  Even if he is not using much of it this tends to be destabilizing for him. Previous Psychotropic Medications: Yes  Psychological Evaluations: Yes  Past Medical History:  Past Medical History:  Diagnosis Date   Depression    Diabetes mellitus without complication ([place])    Hyperlipidemia    Hypertension    Lupus anticoagulant disorder ([place]) 06/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   PE (pulmonary thromboembolism) ([place])    Schizo affective schizophrenia ([place])    Supratherapeutic INR 11/19/2016   History reviewed. No pertinent surgical history. Family History:  Family History  Problem Relation Age of Onset   CAD Mother    CAD Sister    Family Psychiatric  History: None reported Tobacco Screening:  Social History   Tobacco Use  Smoking Status Every Day   Packs/day: 1.00   Years: 23.00   Total pack years: 23.00   Types: Cigarettes  Smokeless Tobacco Never  Tobacco Comments   will provide material    BH Tobacco Counseling     Are you interested in Tobacco Cessation Medications?  Yes, implement Nicotene Replacement Protocol Counseled patient on smoking cessation:  Yes Reason Tobacco Screening Not Completed: Patient  Refused Screening       Social History:  Social History   Substance and Sexual Activity  Alcohol Use Not Currently   Comment: occassionally     Social History   Substance and Sexual Activity  Drug Use Yes   Types: Marijuana   Comment: Last use 11/29/16    Additional Social History:                           Allergies:   Allergies  Allergen Reactions   Penicillins Other (See Comments)    Reaction: "lockjaw" as teen Tolerated Augmentin/Unasyn 06/09/22     Lab Results:  Results for orders placed or performed during the hospital encounter of 08/19/22 (from the past 48 hour(s))  CBC with Differential/Platelet     Status: None   Collection Time: 08/19/22 11:23 AM  Result Value Ref Range   WBC 4.6 4.0 - 10.5 K/uL   RBC 4.66 4.22 - 5.81 MIL/uL   Hemoglobin 14.2 13.0 -  17.0 g/dL   HCT 42.0 39.0 - 52.0 %   MCV 90.1 80.0 - 100.0 fL   MCH 30.5 26.0 - 34.0 pg   MCHC 33.8 30.0 - 36.0 g/dL   RDW 13.3 11.5 - 15.5 %   Platelets 225 150 - 400 K/uL   nRBC 0.0 0.0 - 0.2 %   Neutrophils Relative % 55 %   Neutro Abs 2.6 1.7 - 7.7 K/uL   Lymphocytes Relative 31 %   Lymphs Abs 1.4 0.7 - 4.0 K/uL   Monocytes Relative 14 %   Monocytes Absolute 0.6 0.1 - 1.0 K/uL   Eosinophils Relative 0 %   Eosinophils Absolute 0.0 0.0 - 0.5 K/uL   Basophils Relative 0 %   Basophils Absolute 0.0 0.0 - 0.1 K/uL   Immature Granulocytes 0 %   Abs Immature Granulocytes 0.02 0.00 - 0.07 K/uL    Comment: Performed at [facility], [address] [place], [place] [address]    Blood Alcohol level:  Lab Results  Component Value Date   [facility] <10 08/18/2022   ETH <10 [id number]    Metabolic Disorder Labs:  Lab Results  Component Value Date   HGBA1C 4.9 04/26/2022   MPG 93.93 04/26/2022   MPG 108 11/12/2020   Lab Results  Component Value Date   PROLACTIN 2.0 (L) 08/31/2016   PROLACTIN 4.8 05/22/2016   Lab Results  Component Value Date   CHOL 163 04/28/2022   TRIG 378  (H) 04/28/2022   HDL 35 (L) 04/28/2022   CHOLHDL 4.7 04/28/2022   VLDL 76 (H) 04/28/2022   LDLCALC 52 04/28/2022   LDLCALC UNABLE TO CALCULATE IF TRIGLYCERIDE OVER 400 mg/dL 09/20/2020    Current Medications: Current Facility-Administered Medications  Medication Dose Route Frequency Provider Last Rate Last Admin   acetaminophen (TYLENOL) tablet 650 mg  650 mg Oral Q6H PRN Bennett, Christal H, NP       albuterol (VENTOLIN HFA) 108 (90 Base) MCG/ACT inhaler 2 puff  2 puff Inhalation Q6H PRN Keishawn Darsey T, MD       alum & mag hydroxide-simeth (MAALOX/MYLANTA) 200-200-20 MG/5ML suspension 30 mL  30 mL Oral Q4H PRN Bennett, Christal H, NP       apixaban (ELIQUIS) tablet 5 mg  5 mg Oral BID [place] Creswell, Madie Reno, MD       clonazePAM (KLONOPIN) disintegrating tablet 0.25 mg  0.25 mg Oral BID PRN Albino Bufford, Madie Reno, MD       cloZAPine (CLOZARIL) tablet 250 mg  250 mg Oral QHS Lesha Jager T, MD       diphenhydrAMINE (BENADRYL) capsule 50 mg  50 mg Oral BID PRN Bennett, Christal H, NP       Or   diphenhydrAMINE (BENADRYL) injection 50 mg  50 mg Intramuscular BID PRN Bennett, Christal H, NP       haloperidol (HALDOL) tablet 5 mg  5 mg Oral BID PRN Bennett, Christal H, NP       Or   haloperidol lactate (HALDOL) injection 5 mg  5 mg Intramuscular BID PRN Bennett, Christal H, NP       insulin detemir (LEVEMIR) injection 10 Units  10 Units Subcutaneous BID Elainna Eshleman T, MD       lisinopril (ZESTRIL) tablet 2.5 mg  2.5 mg Oral Daily Rashada Klontz T, MD       lithium carbonate (ESKALITH) ER tablet 450 mg  450 mg Oral QHS Demaris Bousquet, Madie Reno, MD       LORazepam (  ATIVAN) tablet 2 mg  2 mg Oral BID PRN Bennett, Christal H, NP       Or   LORazepam (ATIVAN) injection 2 mg  2 mg Intramuscular BID PRN Bennett, Christal H, NP       magnesium hydroxide (MILK OF MAGNESIA) suspension 30 mL  30 mL Oral Daily PRN Bennett, Christal H, NP       metFORMIN (GLUCOPHAGE) tablet 1,000 mg  1,000 mg Oral BID WC Lizandro Spellman T, MD        mometasone-formoterol (DULERA) 200-5 MCG/ACT inhaler 2 puff  2 puff Inhalation BID Alaiza Yau, Madie Reno, MD       nicotine polacrilex (NICORETTE) gum 2 mg  2 mg Oral PRN Cheryllynn Sarff, Madie Reno, MD       OLANZapine (ZYPREXA) tablet 20 mg  20 mg Oral QHS Adaja Wander, Madie Reno, MD       propranolol (INDERAL) tablet 20 mg  20 mg Oral QHS Stevin Bielinski T, MD       sertraline (ZOLOFT) tablet 100 mg  100 mg Oral Daily Emmagrace Runkel, Madie Reno, MD       simvastatin (ZOCOR) tablet 40 mg  40 mg Oral q1800 Amberle Lyter, Madie Reno, MD       traZODone (DESYREL) tablet 100 mg  100 mg Oral QHS Hamlet Lasecki, Madie Reno, MD       PTA Medications: Medications Prior to Admission  Medication Sig Dispense Refill Last Dose   albuterol (VENTOLIN HFA) 108 (90 Base) MCG/ACT inhaler Inhale 1-2 puffs into the lungs every 6 (six) hours as needed for wheezing or shortness of breath. 18 g 1    apixaban (ELIQUIS) 5 MG TABS tablet Take 1 tablet (5 mg total) by mouth 2 (two) times daily. 60 tablet 1    blood glucose meter kit and supplies Dispense based on patient and insurance preference. Use up to four times daily as directed. (FOR ICD-10 E10.9, E11.9). 1 each 6    clonazePAM (KLONOPIN) 0.25 MG disintegrating tablet Take 1 tablet (0.25 mg total) by mouth 2 (two) times daily as needed (anxiety). 60 tablet 1    clozapine (CLOZARIL) 200 MG tablet Take 1 tablet (200 mg total) by mouth at bedtime. 30 tablet 1    fluticasone-salmeterol (ADVAIR) 250-50 MCG/ACT AEPB Inhale 1 puff into the lungs 2 (two) times daily. 60 each 1    glucose blood (FORA V30A BLOOD GLUCOSE TEST) test strip CHECK BLOOD SUGAR TWICE DAILY. 100 strip 3    hydrOXYzine (ATARAX) 50 MG tablet Take 1 tablet (50 mg total) by mouth every 6 (six) hours as needed for anxiety. 60 tablet 1    insulin detemir (LEVEMIR) 100 UNIT/ML injection Inject 0.1 mLs (10 Units total) into the skin 2 (two) times daily. 10 mL 1    lisinopril (ZESTRIL) 2.5 MG tablet Take 1 tablet (2.5 mg total) by mouth daily. 30 tablet 1     lithium carbonate (ESKALITH) 450 MG ER tablet Take 1 tablet (450 mg total) by mouth at bedtime. 30 tablet 1    metFORMIN (GLUCOPHAGE) 1000 MG tablet Take 1 tablet (1,000 mg total) by mouth 2 (two) times daily with a meal. 60 tablet 1    nicotine polacrilex (NICORETTE) 2 MG gum Take 1 each (2 mg total) by mouth every 2 (two) hours as needed for smoking cessation. 100 tablet 0    NOVOFINE AUTOCOVER PEN NEEDLE 30G X 8 MM MISC USE WITH LEVEMIR TWICE DAILY. 100 each 11    OLANZapine (ZYPREXA) 20 MG tablet  Take 1 tablet (20 mg total) by mouth at bedtime. 30 tablet 1    propranolol (INDERAL) 20 MG tablet Take 1 tablet (20 mg total) by mouth at bedtime. 30 tablet 1    sertraline (ZOLOFT) 100 MG tablet Take 1 tablet (100 mg total) by mouth daily. 30 tablet 1    simvastatin (ZOCOR) 40 MG tablet Take 1 tablet (40 mg total) by mouth daily at 6 PM. 30 tablet 1    traZODone (DESYREL) 100 MG tablet Take 1 tablet (100 mg total) by mouth at bedtime. 30 tablet 1     Musculoskeletal: Strength & Muscle Tone: within normal limits Gait & Station: normal Patient leans: N/A            Psychiatric Specialty Exam:  Presentation  General Appearance:  Bizarre  Eye Contact: Minimal  Speech: Pressured  Speech Volume: Increased  Handedness: Right   Mood and Affect  Mood: Anxious; Euphoric; Irritable  Affect: Congruent   Thought Process  Thought Processes: Disorganized  Duration of Psychotic Symptoms: Longstanding chronic for many years. Past Diagnosis of Schizophrenia or Psychoactive disorder: Yes  Descriptions of Associations:Loose  Orientation:Full (Time, Place and Person)  Thought Content:Illogical  Hallucinations:Hallucinations: Visual; Auditory Description of Auditory Hallucinations: Did not say Description of Visual Hallucinations: See demons  Ideas of Reference:Paranoia  Suicidal Thoughts:Suicidal Thoughts: No  Homicidal Thoughts:Homicidal Thoughts:  No   Sensorium  Memory: Immediate Fair; Recent Fair; Remote Fair  Judgment: Poor  Insight: Poor   Executive Functions  Concentration: Poor  Attention Span: Poor  Recall: Poor  Fund of Knowledge: Poor  Language: Poor   Psychomotor Activity  Psychomotor Activity: Psychomotor Activity: Increased (Pacing)   Assets  Assets: Communication Skills; Desire for Improvement; Housing; Physical Health; Resilience; Social Support   Sleep  Sleep: Sleep: Poor    Physical Exam: Physical Exam Vitals and nursing note reviewed.  Constitutional:      Appearance: Normal appearance.  HENT:     Head: Normocephalic and atraumatic.     Mouth/Throat:     Pharynx: Oropharynx is clear.  Eyes:     Pupils: Pupils are equal, round, and reactive to light.  Cardiovascular:     Rate and Rhythm: Normal rate and regular rhythm.  Pulmonary:     Effort: Pulmonary effort is normal.     Breath sounds: Normal breath sounds.  Abdominal:     General: Abdomen is flat.     Palpations: Abdomen is soft.  Musculoskeletal:        General: Normal range of motion.  Skin:    General: Skin is warm and dry.  Neurological:     General: No focal deficit present.     Mental Status: He is alert. Mental status is at baseline.  Psychiatric:        Attention and Perception: Attention normal.        Mood and Affect: Mood normal.        Speech: Speech normal.        Behavior: Behavior is cooperative.        Thought Content: Thought content is delusional.        Cognition and Memory: Cognition normal.        Judgment: Judgment is impulsive.    Review of Systems  Constitutional: Negative.   HENT: Negative.    Eyes: Negative.   Respiratory: Negative.    Cardiovascular: Negative.   Gastrointestinal: Negative.   Musculoskeletal: Negative.   Skin: Negative.   Neurological: Negative.   Psychiatric/Behavioral:  Positive for hallucinations. Negative for suicidal ideas. The patient is  nervous/anxious.    Blood pressure 101/67, pulse (!) 58, temperature 97.8 F (36.6 C), temperature source Oral, resp. rate 17, height 5' 10.5" (1.791 m), weight 82.6 kg, SpO2 99 %. Body mass index is 25.75 kg/m.  Treatment Plan Summary: Daily contact with patient to assess and evaluate symptoms and progress in treatment, Medication management, and Plan restart medicines based on what he was taking when he was here last time.  I am also going to increase his clozapine to 250 mg.  Labs reviewed.  I will make sure the ACT team is informed that he is here.  Engage in groups and activities.  Ongoing assessment of dangerousness and progress prior to discharge.  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[place]) Long Term Goal(s): Improvement in symptoms so as ready for discharge  Short Term Goals: Ability to verbalize feelings will improve, Ability to demonstrate self-control will improve, and Ability to identify and develop effective coping behaviors will improve  Physician Treatment Plan for Secondary Diagnosis: Principal Problem:   Schizophrenia, 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    Alethia Berthold, MD 3/6/20244:50 PM

## 2022-08-19 NOTE — [facility] Suicide Risk Assessment (Signed)
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  Patient familiar from prior admissions.  47 year old man with a history of schizophrenia brought himself to the hospital saying he needed to be admitted to the hospital and talking initially about some suicidal or homicidal ideation.  On interview with me today the patient does not mention anything about being suicidal.  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  Not hostile or aggressive.  Cooperative with the idea of getting on his medicine again.  Continued Clinical Symptoms:  Alcohol Use Disorder Identification Test Final Score (AUDIT): 0 The "Alcohol Use Disorders Identification Test", Guidelines for Use in Primary Care, Second Edition.  World Pharmacologist [facility]). Score between 0-7:  no or low risk or alcohol related problems. Score between 8-15:  moderate risk of alcohol related problems. Score between 16-19:  high risk of alcohol related problems. Score 20 or above:  warrants further diagnostic evaluation for alcohol dependence and treatment.   CLINICAL FACTORS:   Schizophrenia:   Paranoid or undifferentiated type   Musculoskeletal: Strength &  Muscle Tone: within normal limits Gait & Station: normal Patient leans: N/A  Psychiatric Specialty Exam:  Presentation  General Appearance:  Bizarre  Eye Contact: Minimal  Speech: Pressured  Speech Volume: Increased  Handedness: Right   Mood and Affect  Mood: Anxious; Euphoric; Irritable  Affect: Congruent   Thought Process  Thought Processes: Disorganized  Descriptions of Associations:Loose  Orientation:Full (Time, Place and Person)  Thought Content:Illogical  History of Schizophrenia/Schizoaffective disorder:Yes  Duration of Psychotic Symptoms:Greater than six months  Hallucinations:Hallucinations: Visual; Auditory Description of Auditory Hallucinations: Did not say Description of Visual Hallucinations: See demons  Ideas of Reference:Paranoia  Suicidal Thoughts:Suicidal Thoughts: No  Homicidal Thoughts:Homicidal Thoughts: No   Sensorium  Memory: Immediate Fair; Recent Fair; Remote Fair  Judgment: Poor  Insight: Poor   Executive Functions  Concentration: Poor  Attention Span: Poor  Recall: Poor  Fund of Knowledge: Poor  Language: Poor   Psychomotor Activity  Psychomotor Activity: Psychomotor Activity: Increased (Pacing)   Assets  Assets: Communication Skills; Desire for Improvement; Housing; Physical Health; Resilience; Social Support   Sleep  Sleep: Sleep: Poor    Physical Exam: Physical Exam Vitals and nursing note reviewed.  Constitutional:      Appearance: Normal appearance.  HENT:     Head: Normocephalic and atraumatic.     Mouth/Throat:     Pharynx: Oropharynx is clear.  Eyes:     Pupils: Pupils are equal, round, and reactive to light.  Cardiovascular:     Rate and Rhythm: Normal rate and regular rhythm.  Pulmonary:     Effort: Pulmonary effort is normal.     Breath sounds: Normal breath sounds.  Abdominal:     General: Abdomen is flat.     Palpations: Abdomen is  soft.  Musculoskeletal:         General: Normal range of motion.  Skin:    General: Skin is warm and dry.  Neurological:     General: No focal deficit present.     Mental Status: He is alert. Mental status is at baseline.  Psychiatric:        Attention and Perception: Attention normal.        Mood and Affect: Mood normal.        Speech: Speech is tangential.        Behavior: Behavior is cooperative.        Thought Content: Thought content is delusional.        Cognition and Memory: Cognition normal.        Judgment: Judgment is impulsive.    Review of Systems  Constitutional: Negative.   HENT: Negative.    Eyes: Negative.   Respiratory: Negative.    Cardiovascular: Negative.   Gastrointestinal: Negative.   Musculoskeletal: Negative.   Skin: Negative.   Neurological: Negative.   Psychiatric/Behavioral:  Positive for hallucinations and substance abuse. Negative for depression and suicidal ideas. The patient is nervous/anxious and has insomnia.    Blood pressure 101/67, pulse (!) 58, temperature 97.8 F (36.6 C), temperature source Oral, resp. rate 17, height 5' 10.5" (1.791 m), weight 82.6 kg, SpO2 99 %. Body mass index is 25.75 kg/m.   COGNITIVE FEATURES THAT CONTRIBUTE TO RISK:  None    SUICIDE RISK:   Minimal: No identifiable suicidal ideation.  Patients presenting with no risk factors but with morbid ruminations; may be classified as minimal risk based on the severity of the depressive symptoms  PLAN OF CARE: Reviewed medications.  Restart medicines based on what he was on previously which has been effective.  Additionally am going to try gently increasing his clozapine as I think probably he is not getting as much benefit to keep himself away from psychosis as he could.  Labs reviewed.  Involve in individual and group therapy.  Ongoing assessment of dangerousness prior to discharge.  I certify that inpatient services furnished can reasonably be expected to improve the patient's condition.   Alethia Berthold, MD 08/19/2022, 4:46 PM

## 2022-08-19 NOTE — Plan of Care (Signed)
  Problem: Education: Goal: Knowledge of General Education information will improve Description: Including pain rating scale, medication(s)/side effects and non-pharmacologic comfort measures Outcome: Progressing   Problem: Nutrition: Goal: Adequate nutrition will be maintained Outcome: Progressing   Problem: Coping: Goal: Will verbalize feelings Outcome: Progressing   Problem: Self-Concept: Goal: Level of anxiety will decrease Outcome: Progressing   Problem: Education: Goal: Knowledge of [place] [facility] Education information/materials will improve Outcome: Progressing   Problem: Coping: Goal: Level of anxiety will decrease Outcome: Not Progressing

## 2022-08-20 DIAGNOSIS — F203 Undifferentiated schizophrenia: Secondary | ICD-10-CM | POA: Diagnosis not present

## 2022-08-20 MED ORDER — ENSURE ENLIVE PO LIQD
237.0000 mL | Freq: Two times a day (BID) | ORAL | Status: DC
  Administered 2022-08-20 – 2022-08-25 (×11): 237 mL via ORAL

## 2022-08-20 NOTE — Group Note (Signed)
Recreation Therapy Group Note   Group Topic:Emotion Expression  Group Date: 08/20/2022 Start Time: 1000 End Time: 1100 Facilitators: Vilma Prader, LRT, CTRS Location:  [facility]  Group Description: Gratitude Journaling. Patients and LRT discussed what gratitude means, how we can express it and what it means to [place], personally. LRT gave an education handout on the definition of gratitude that also gave different examples of gratitude exercises that they could try. One of the examples was "Gratitude Letter", which prompted you to write a letter to someone you appreciate. LRT played soft music while everyone wrote their letter. Once letter was completed, LRT encouraged people to read their letter, if they wanted to, or share who they wrote it to, at minimum. LRT and pts processed how showing gratitude towards themselves, and others can be applied to everyday life post-discharge.   Affect/Mood: Flat   Participation Level: Moderate   Participation Quality: Minimal Cues   Behavior: Appropriate and Distracted   Speech/Thought Process: Coherent   Insight: Limited   Judgement: Limited   Modes of Intervention: Activity and Education   Patient Response to Interventions:  Attentive   Education Outcome:  In group clarification offered    Clinical Observations/Individualized Feedback: Lance [name] was somewhat in their participation of session activities and group discussion. Pt spontaneously contributed to the group discussion on what gratitude meant to him. He shared that it meant "being stingy and wanting things that you do not have". Pt wrote a sentence on the paper before he got up and left the room. Pt did not return. Pt was present in group for approximately 20 minutes.    Plan: Continue to engage patient in RT group sessions 2-3x/week.   Vilma Prader, LRT, CTRS 08/20/2022 11:26 AM

## 2022-08-20 NOTE — Progress Notes (Signed)
Patient calm and pleasant during assessment denying SI/HI/AVH. Pt stated that he had a good day. Pt observed interacting appropriately with staff and peers on the unit by this Probation officer. Pt compliant with medication administration per MD orders. Pt given education, support, and encouragement to be active in his treatment plan. Pt being monitored Q 15 minutes for safety per unit protocol, remains safe on the unit

## 2022-08-20 NOTE — [facility] Suicide Risk Assessment (Signed)
[place] INPATIENT:  Family/Significant Other Suicide Prevention Education  Suicide Prevention Education:  Contact Attempts: [name] [name]/legal guardian [phone]), has been identified by the patient as the family member/significant other with whom the patient will be residing, and identified as the person(s) who will aid the patient in the event of a mental health crisis.  With written consent from the patient, two attempts were made to provide suicide prevention education, prior to and/or following the patient's discharge.  We were unsuccessful in providing suicide prevention education.  A suicide education pamphlet was given to the patient to share with family/significant other.  Date and time of first attempt: 08/20/22 at 15:23 Date and time of second attempt: Second attempt needed  CSW was unable to establish contact with guardian but was able to leave a HIPAA compliant voicemail with contact information for follow through.   Lance [name] 08/20/2022, 3:25 PM

## 2022-08-20 NOTE — Plan of Care (Signed)
Problem: Education: Goal: Knowledge of General Education information will improve Description: Including pain rating scale, medication(s)/side effects and non-pharmacologic comfort measures 08/20/2022 1052 by Earvin Hansen, RN Outcome: Progressing 08/20/2022 1052 by Earvin Hansen, RN Outcome: Progressing 08/20/2022 1051 by Earvin Hansen, RN Outcome: Progressing   Problem: Health Behavior/Discharge Planning: Goal: Ability to manage health-related needs will improve 08/20/2022 1052 by Earvin Hansen, RN Outcome: Progressing 08/20/2022 1052 by Earvin Hansen, RN Outcome: Progressing 08/20/2022 1051 by Earvin Hansen, RN Outcome: Progressing   Problem: Clinical Measurements: Goal: Ability to maintain clinical measurements within normal limits will improve 08/20/2022 1052 by Earvin Hansen, RN Outcome: Progressing 08/20/2022 1052 by Earvin Hansen, RN Outcome: Progressing 08/20/2022 1051 by Earvin Hansen, RN Outcome: Progressing Goal: Will remain free from infection 08/20/2022 1052 by Earvin Hansen, RN Outcome: Progressing 08/20/2022 1052 by Earvin Hansen, RN Outcome: Progressing 08/20/2022 1051 by Earvin Hansen, RN Outcome: Progressing Goal: Diagnostic test results will improve 08/20/2022 1052 by Earvin Hansen, RN Outcome: Progressing 08/20/2022 1052 by Earvin Hansen, RN Outcome: Progressing 08/20/2022 1051 by Earvin Hansen, RN Outcome: Not Progressing Goal: Respiratory complications will improve 08/20/2022 1052 by Earvin Hansen, RN Outcome: Progressing 08/20/2022 1052 by Earvin Hansen, RN Outcome: Progressing 08/20/2022 1051 by Earvin Hansen, RN Outcome: Not Progressing Goal: Cardiovascular complication will be avoided 08/20/2022 1052 by Earvin Hansen, RN Outcome: Progressing 08/20/2022 1052 by Earvin Hansen, RN Outcome: Progressing 08/20/2022 1051 by Earvin Hansen, RN Outcome: Not Progressing   Problem: Activity: Goal: Risk for activity intolerance will  decrease 08/20/2022 1052 by Earvin Hansen, RN Outcome: Progressing 08/20/2022 1052 by Earvin Hansen, RN Outcome: Progressing 08/20/2022 1051 by Earvin Hansen, RN Outcome: Not Progressing   Problem: Nutrition: Goal: Adequate nutrition will be maintained 08/20/2022 1052 by Earvin Hansen, RN Outcome: Progressing 08/20/2022 1052 by Earvin Hansen, RN Outcome: Progressing 08/20/2022 1051 by Earvin Hansen, RN Outcome: Not Progressing   Problem: Coping: Goal: Level of anxiety will decrease 08/20/2022 1052 by Earvin Hansen, RN Outcome: Progressing 08/20/2022 1052 by Earvin Hansen, RN Outcome: Progressing 08/20/2022 1051 by Earvin Hansen, RN Outcome: Not Progressing   Problem: Elimination: Goal: Will not experience complications related to bowel motility 08/20/2022 1052 by Earvin Hansen, RN Outcome: Progressing 08/20/2022 1052 by Earvin Hansen, RN Outcome: Progressing 08/20/2022 1051 by Earvin Hansen, RN Outcome: Not Progressing Goal: Will not experience complications related to urinary retention 08/20/2022 1052 by Earvin Hansen, RN Outcome: Progressing 08/20/2022 1052 by Earvin Hansen, RN Outcome: Progressing 08/20/2022 1051 by Earvin Hansen, RN Outcome: Not Progressing   Problem: Pain Managment: Goal: General experience of comfort will improve 08/20/2022 1052 by Earvin Hansen, RN Outcome: Progressing 08/20/2022 1052 by Earvin Hansen, RN Outcome: Progressing 08/20/2022 1051 by Earvin Hansen, RN Outcome: Not Progressing   Problem: Safety: Goal: Ability to remain free from injury will improve 08/20/2022 1052 by Earvin Hansen, RN Outcome: Progressing 08/20/2022 1052 by Earvin Hansen, RN Outcome: Progressing 08/20/2022 1051 by Earvin Hansen, RN Outcome: Not Progressing   Problem: Skin Integrity: Goal: Risk for impaired skin integrity will decrease 08/20/2022 1052 by Earvin Hansen, RN Outcome: Progressing 08/20/2022 1052 by Earvin Hansen, RN Outcome:  Progressing 08/20/2022 1051 by Earvin Hansen, RN Outcome: Not Progressing   Problem: Education: Goal: Utilization of techniques to improve thought processes will improve 08/20/2022 1052 by  Earvin Hansen, RN Outcome: Progressing 08/20/2022 1052 by Earvin Hansen, RN Outcome: Progressing 08/20/2022 1051 by Earvin Hansen, RN Outcome: Not Progressing Goal: Knowledge of the prescribed therapeutic regimen will improve 08/20/2022 1052 by Earvin Hansen, RN Outcome: Progressing 08/20/2022 1052 by Earvin Hansen, RN Outcome: Progressing 08/20/2022 1051 by Earvin Hansen, RN Outcome: Not Progressing   Problem: Activity: Goal: Interest or engagement in leisure activities will improve 08/20/2022 1052 by Earvin Hansen, RN Outcome: Progressing 08/20/2022 1052 by Earvin Hansen, RN Outcome: Progressing 08/20/2022 1051 by Earvin Hansen, RN Outcome: Not Progressing Goal: Imbalance in normal sleep/wake cycle will improve 08/20/2022 1052 by Earvin Hansen, RN Outcome: Progressing 08/20/2022 1052 by Earvin Hansen, RN Outcome: Progressing 08/20/2022 1051 by Earvin Hansen, RN Outcome: Not Progressing   Problem: Coping: Goal: Coping ability will improve 08/20/2022 1052 by Earvin Hansen, RN Outcome: Progressing 08/20/2022 1052 by Earvin Hansen, RN Outcome: Progressing 08/20/2022 1051 by Earvin Hansen, RN Outcome: Not Progressing Goal: Will verbalize feelings 08/20/2022 1052 by Earvin Hansen, RN Outcome: Progressing 08/20/2022 1052 by Earvin Hansen, RN Outcome: Progressing 08/20/2022 1051 by Earvin Hansen, RN Outcome: Not Progressing   Problem: Health Behavior/Discharge Planning: Goal: Ability to make decisions will improve 08/20/2022 1052 by Earvin Hansen, RN Outcome: Progressing 08/20/2022 1052 by Earvin Hansen, RN Outcome: Progressing 08/20/2022 1051 by Earvin Hansen, RN Outcome: Not Progressing Goal: Compliance with therapeutic regimen will improve 08/20/2022 1052 by Earvin Hansen,  RN Outcome: Progressing 08/20/2022 1052 by Earvin Hansen, RN Outcome: Progressing 08/20/2022 1051 by Earvin Hansen, RN Outcome: Not Progressing   Problem: Role Relationship: Goal: Will demonstrate positive changes in social behaviors and relationships 08/20/2022 1052 by Earvin Hansen, RN Outcome: Progressing 08/20/2022 1052 by Earvin Hansen, RN Outcome: Progressing 08/20/2022 1051 by Earvin Hansen, RN Outcome: Not Progressing   Problem: Safety: Goal: Ability to disclose and discuss suicidal ideas will improve 08/20/2022 1052 by Earvin Hansen, RN Outcome: Progressing 08/20/2022 1052 by Earvin Hansen, RN Outcome: Progressing 08/20/2022 1051 by Earvin Hansen, RN Outcome: Not Progressing Goal: Ability to identify and utilize support systems that promote safety will improve 08/20/2022 1052 by Earvin Hansen, RN Outcome: Progressing 08/20/2022 1052 by Earvin Hansen, RN Outcome: Progressing 08/20/2022 1051 by Earvin Hansen, RN Outcome: Not Progressing   Problem: Self-Concept: Goal: Will verbalize positive feelings about self 08/20/2022 1052 by Earvin Hansen, RN Outcome: Progressing 08/20/2022 1052 by Earvin Hansen, RN Outcome: Progressing 08/20/2022 1051 by Earvin Hansen, RN Outcome: Not Progressing Goal: Level of anxiety will decrease 08/20/2022 1052 by Earvin Hansen, RN Outcome: Progressing 08/20/2022 1052 by Earvin Hansen, RN Outcome: Progressing 08/20/2022 1051 by Earvin Hansen, RN Outcome: Not Progressing   Problem: Education: Goal: Knowledge of [place] Education information/materials will improve 08/20/2022 1052 by Earvin Hansen, RN Outcome: Progressing 08/20/2022 1052 by Earvin Hansen, RN Outcome: Progressing 08/20/2022 1051 by Earvin Hansen, RN Outcome: Not Progressing Goal: Emotional status will improve 08/20/2022 1052 by Earvin Hansen, RN Outcome: Progressing 08/20/2022 1052 by Earvin Hansen, RN Outcome: Progressing 08/20/2022 1051 by Earvin Hansen, RN Outcome: Not Progressing Goal: Mental status will improve 08/20/2022 1052 by Earvin Hansen, RN Outcome: Progressing 08/20/2022 1052 by Earvin Hansen, RN Outcome: Progressing 08/20/2022 1051 by Earvin Hansen, RN Outcome: Not Progressing Goal: Verbalization of understanding the information provided  will improve 08/20/2022 1052 by Earvin Hansen, RN Outcome: Progressing 08/20/2022 1052 by Earvin Hansen, RN Outcome: Progressing 08/20/2022 1051 by Earvin Hansen, RN Outcome: Not Progressing   Problem: Activity: Goal: Interest or engagement in activities will improve 08/20/2022 1052 by Earvin Hansen, RN Outcome: Progressing 08/20/2022 1052 by Earvin Hansen, RN Outcome: Progressing 08/20/2022 1051 by Earvin Hansen, RN Outcome: Not Progressing Goal: Sleeping patterns will improve 08/20/2022 1052 by Earvin Hansen, RN Outcome: Progressing 08/20/2022 1052 by Earvin Hansen, RN Outcome: Progressing 08/20/2022 1051 by Earvin Hansen, RN Outcome: Not Progressing   Problem: Coping: Goal: Ability to verbalize frustrations and anger appropriately will improve 08/20/2022 1052 by Earvin Hansen, RN Outcome: Progressing 08/20/2022 1052 by Earvin Hansen, RN Outcome: Progressing 08/20/2022 1051 by Earvin Hansen, RN Outcome: Not Progressing Goal: Ability to demonstrate self-control will improve 08/20/2022 1052 by Earvin Hansen, RN Outcome: Progressing 08/20/2022 1052 by Earvin Hansen, RN Outcome: Progressing 08/20/2022 1051 by Earvin Hansen, RN Outcome: Not Progressing   Problem: Health Behavior/Discharge Planning: Goal: Identification of resources available to assist in meeting health care needs will improve 08/20/2022 1052 by Earvin Hansen, RN Outcome: Progressing 08/20/2022 1052 by Earvin Hansen, RN Outcome: Progressing 08/20/2022 1051 by Earvin Hansen, RN Outcome: Not Progressing Goal: Compliance with treatment plan for underlying cause of condition will  improve 08/20/2022 1052 by Earvin Hansen, RN Outcome: Progressing 08/20/2022 1052 by Earvin Hansen, RN Outcome: Progressing 08/20/2022 1051 by Earvin Hansen, RN Outcome: Not Progressing   Problem: Physical Regulation: Goal: Ability to maintain clinical measurements within normal limits will improve 08/20/2022 1052 by Earvin Hansen, RN Outcome: Progressing 08/20/2022 1052 by Earvin Hansen, RN Outcome: Progressing 08/20/2022 1051 by Earvin Hansen, RN Outcome: Not Progressing   Problem: Safety: Goal: Periods of time without injury will increase 08/20/2022 1052 by Earvin Hansen, RN Outcome: Progressing 08/20/2022 1052 by Earvin Hansen, RN Outcome: Progressing 08/20/2022 1051 by Earvin Hansen, RN Outcome: Not Progressing   Problem: Education: Goal: Ability to state activities that reduce stress will improve 08/20/2022 1052 by Earvin Hansen, RN Outcome: Progressing 08/20/2022 1052 by Earvin Hansen, RN Outcome: Progressing 08/20/2022 1051 by Earvin Hansen, RN Outcome: Not Progressing   Problem: Coping: Goal: Ability to identify and develop effective coping behavior will improve 08/20/2022 1052 by Earvin Hansen, RN Outcome: Progressing 08/20/2022 1052 by Earvin Hansen, RN Outcome: Progressing 08/20/2022 1051 by Earvin Hansen, RN Outcome: Not Progressing   Problem: Self-Concept: Goal: Ability to identify factors that promote anxiety will improve 08/20/2022 1052 by Earvin Hansen, RN Outcome: Progressing 08/20/2022 1052 by Earvin Hansen, RN Outcome: Progressing 08/20/2022 1051 by Earvin Hansen, RN Outcome: Not Progressing Goal: Level of anxiety will decrease 08/20/2022 1052 by Earvin Hansen, RN Outcome: Progressing 08/20/2022 1052 by Earvin Hansen, RN Outcome: Progressing 08/20/2022 1051 by Earvin Hansen, RN Outcome: Not Progressing Goal: Ability to modify response to factors that promote anxiety will improve 08/20/2022 1052 by Earvin Hansen, RN Outcome:  Progressing 08/20/2022 1052 by Earvin Hansen, RN Outcome: Progressing 08/20/2022 1051 by Earvin Hansen, RN Outcome: Not Progressing

## 2022-08-20 NOTE — Group Note (Signed)
[facility] LCSW Group Therapy Note    Group Date: 08/20/2022 Start Time: 1300 End Time: 1400  Type of Therapy and Topic:  Group Therapy:  Overcoming Obstacles  Participation Level:  [facility] PARTICIPATION LEVEL: Minimal  Description of Group:   In this group patients will be encouraged to explore what they see as obstacles to their own wellness and recovery. They will be guided to discuss their thoughts, feelings, and behaviors related to these obstacles. The group will process together ways to cope with barriers, with attention given to specific choices patients can make. Each patient will be challenged to identify changes they are motivated to make in order to overcome their obstacles. This group will be process-oriented, with patients participating in exploration of their own experiences as well as giving and receiving support and challenge from other group members.  Therapeutic Goals: 1. Patient will identify personal and current obstacles as they relate to admission. 2. Patient will identify barriers that currently interfere with their wellness or overcoming obstacles.  3. Patient will identify feelings, thought process and behaviors related to these barriers. 4. Patient will identify two changes they are willing to make to overcome these obstacles:    Summary of Patient Progress   Patient was present for the entirety of group session. Patient participated in opening and closing remarks. However, patient did not contribute at all to the topic of discussion despite encouraged participation.     Therapeutic Modalities:   Cognitive Behavioral Therapy Solution Focused Therapy Motivational Interviewing Relapse Prevention Therapy   Durenda Hurt, [place]

## 2022-08-20 NOTE — Progress Notes (Signed)
[facility] MD Progress Note  08/20/2022 1:43 PM Lance [name]  MRN:  [id number] Subjective: Follow-up patient with schizophrenia.  Patient not showing any agitation today.  Looks a little sedated probably from restarting all of his medicine.  Not excessively however.  Still vague in his talk and seems to be preoccupied by religious themes and some of his delusional thinking. 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   PE (pulmonary thromboembolism) ([place])    Schizo affective schizophrenia ([place])    Supratherapeutic INR 11/19/2016   History reviewed. No pertinent surgical history. Family History:  Family History  Problem Relation Age of Onset   CAD Mother    CAD Sister    Family Psychiatric  History: See previous Social History:  Social History   Substance and Sexual Activity  Alcohol Use Not Currently   Comment: occassionally     Social History   Substance and Sexual Activity  Drug Use Yes   Types: Marijuana   Comment: Last use 11/29/16    Social History   Socioeconomic History   Marital status: Single    Spouse name: Not on file   Number of children: Not on file   Years of education: Not on file   Highest education level: Not on file  Occupational History   Not on file  Tobacco Use   Smoking status: Every Day    Packs/day: 1.00    Years: 23.00    Total pack years: 23.00     Types: Cigarettes   Smokeless tobacco: Never   Tobacco comments:    will provide material  Vaping Use   Vaping Use: Some days  Substance and Sexual Activity   Alcohol use: Not Currently    Comment: occassionally   Drug use: Yes    Types: Marijuana    Comment: Last use 11/29/16   Sexual activity: Not Currently    Birth control/protection: None    Comment: occasional marijuana- none recently  Other Topics Concern   Not on file  Social History Narrative   From a group home in [place] Determinants of Health   Financial Resource Strain: Low Risk  (03/20/2021)   Overall Financial Resource Strain (CARDIA)    Difficulty of Paying Living Expenses: Not very hard  Food Insecurity: No Food Insecurity (08/19/2022)   Hunger Vital Sign    Worried About Running Out of Food in the Last Year: Never true    Ran Out of Food in the Last Year: Never true  Recent Concern: Food Insecurity - Food Insecurity Present (06/17/2022)   Hunger Vital Sign    Worried About Running Out of Food in the Last Year: Sometimes true    Ran Out of Food in the Last Year: Sometimes true  Transportation Needs: Unmet Transportation Needs (08/19/2022)   PRAPARE - Hydrologist (Medical): Yes    Lack of Transportation (Non-Medical): Yes  Physical  Activity: Insufficiently Active (03/20/2021)   Exercise Vital Sign    Days of Exercise per Week: 3 days    Minutes of Exercise per Session: 20 min  Stress: No Stress Concern Present (03/20/2021)   [place]    Feeling of Stress : Only a little  Social Connections: Socially Isolated (03/20/2021)   Social Connection and Isolation Panel [NHANES]    Frequency of Communication with Friends and Family: More than three times a week    Frequency of Social Gatherings with Friends and Family: More than three times a week    Attends Religious Services: Never    Marine scientist or  Organizations: No    Attends Music therapist: Never    Marital Status: Never married   Additional Social History:                         Sleep: Fair  Appetite:  Fair  Current Medications: Current Facility-Administered Medications  Medication Dose Route Frequency Provider Last Rate Last Admin   acetaminophen (TYLENOL) tablet 650 mg  650 mg Oral Q6H PRN Bennett, Christal H, NP       albuterol (VENTOLIN HFA) 108 (90 Base) MCG/ACT inhaler 2 puff  2 puff Inhalation Q6H PRN Aariya Ferrick T, MD       alum & mag hydroxide-simeth (MAALOX/MYLANTA) 200-200-20 MG/5ML suspension 30 mL  30 mL Oral Q4H PRN Bennett, Christal H, NP       apixaban (ELIQUIS) tablet 5 mg  5 mg Oral BID Joaquina Nissen T, MD   5 mg at 08/20/22 0911   clonazePAM (KLONOPIN) disintegrating tablet 0.25 mg  0.25 mg Oral BID PRN Janisse Ghan, Madie Reno, MD       cloZAPine (CLOZARIL) tablet 250 mg  250 mg Oral QHS Jazmyne Beauchesne T, MD   250 mg at 08/19/22 2112   feeding supplement (ENSURE ENLIVE / ENSURE PLUS) liquid 237 mL  237 mL Oral BID BM Valor Turberville T, MD   237 mL at 08/20/22 1216   insulin detemir (LEVEMIR) injection 10 Units  10 Units Subcutaneous BID Kennie Snedden, Madie Reno, MD   10 Units at 08/20/22 0911   lisinopril (ZESTRIL) tablet 2.5 mg  2.5 mg Oral Daily Tamica Covell T, MD   2.5 mg at 08/20/22 0912   lithium carbonate (ESKALITH) ER tablet 450 mg  450 mg Oral QHS Kylina Vultaggio T, MD   450 mg at 08/19/22 2113   magnesium hydroxide (MILK OF MAGNESIA) suspension 30 mL  30 mL Oral Daily PRN Bennett, Christal H, NP       metFORMIN (GLUCOPHAGE) tablet 1,000 mg  1,000 mg Oral BID WC Airam Runions T, MD   1,000 mg at 08/20/22 0912   mometasone-formoterol (DULERA) 200-5 MCG/ACT inhaler 2 puff  2 puff Inhalation BID Kariyah Baugh, Madie Reno, MD   2 puff at 08/20/22 0912   nicotine polacrilex (NICORETTE) gum 2 mg  2 mg Oral PRN Tore Carreker T, MD   2 mg at 08/20/22 1216   OLANZapine (ZYPREXA) tablet 20 mg  20 mg Oral QHS  Waymon Laser T, MD   20 mg at 08/19/22 2113   propranolol (INDERAL) tablet 20 mg  20 mg Oral QHS Jameson Morrow T, MD       sertraline (ZOLOFT) tablet 100 mg  100 mg Oral Daily Eljay Lave T, MD   100 mg at 08/20/22 0913   simvastatin (ZOCOR) tablet  40 mg  40 mg Oral q1800 Drystan Reader, Madie Reno, MD   40 mg at 08/19/22 1720   traZODone (DESYREL) tablet 100 mg  100 mg Oral QHS Jalicia Roszak, Madie Reno, MD   100 mg at 08/19/22 2113    Lab Results:  Results for orders placed or performed during the hospital encounter of 08/19/22 (from the past 48 hour(s))  CBC with Differential/Platelet     Status: None   Collection Time: 08/19/22 11:23 AM  Result Value Ref Range   WBC 4.6 4.0 - 10.5 K/uL   RBC 4.66 4.22 - 5.81 MIL/uL   Hemoglobin 14.2 13.0 - 17.0 g/dL   HCT 42.0 39.0 - 52.0 %   MCV 90.1 80.0 - 100.0 fL   MCH 30.5 26.0 - 34.0 pg   MCHC 33.8 30.0 - 36.0 g/dL   RDW 13.3 11.5 - 15.5 %   Platelets 225 150 - 400 K/uL   nRBC 0.0 0.0 - 0.2 %   Neutrophils Relative % 55 %   Neutro Abs 2.6 1.7 - 7.7 K/uL   Lymphocytes Relative 31 %   Lymphs Abs 1.4 0.7 - 4.0 K/uL   Monocytes Relative 14 %   Monocytes Absolute 0.6 0.1 - 1.0 K/uL   Eosinophils Relative 0 %   Eosinophils Absolute 0.0 0.0 - 0.5 K/uL   Basophils Relative 0 %   Basophils Absolute 0.0 0.0 - 0.1 K/uL   Immature Granulocytes 0 %   Abs Immature Granulocytes 0.02 0.00 - 0.07 K/uL    Comment: Performed at [facility], [address] [place], [place] [address]  Glucose, capillary     Status: Abnormal   Collection Time: 08/19/22  4:56 PM  Result Value Ref Range   Glucose-Capillary 136 (H) 70 - 99 mg/dL    Comment: Glucose reference range applies only to samples taken after fasting for at least 8 hours.    Blood Alcohol level:  Lab Results  Component Value Date   ETH <10 08/18/2022   ETH <10 123456    Metabolic Disorder Labs: Lab Results  Component Value Date   HGBA1C 4.9 04/26/2022   MPG 93.93 04/26/2022   MPG 108  11/12/2020   Lab Results  Component Value Date   PROLACTIN 2.0 (L) 08/31/2016   PROLACTIN 4.8 05/22/2016   Lab Results  Component Value Date   CHOL 163 04/28/2022   TRIG 378 (H) 04/28/2022   HDL 35 (L) 04/28/2022   CHOLHDL 4.7 04/28/2022   VLDL 76 (H) 04/28/2022   LDLCALC 52 04/28/2022   LDLCALC UNABLE TO CALCULATE IF TRIGLYCERIDE OVER 400 mg/dL 09/20/2020    Physical Findings: AIMS: Facial and Oral Movements Muscles of Facial Expression: None, normal Lips and Perioral Area: None, normal Jaw: None, normal Tongue: None, normal,Extremity Movements Upper (arms, wrists, hands, fingers): None, normal Lower (legs, knees, ankles, toes): None, normal, Trunk Movements Neck, shoulders, hips: None, normal, Overall Severity Severity of abnormal movements (highest score from questions above): None, normal Incapacitation due to abnormal movements: None, normal Patient's awareness of abnormal movements (rate only patient's report): No Awareness, Dental Status Current problems with teeth and/or dentures?: No Does patient usually wear dentures?: No  CIWA:    COWS:     Musculoskeletal: Strength & Muscle Tone: within normal limits Gait & Station: normal Patient leans: N/A  Psychiatric Specialty Exam:  Presentation  General Appearance:  Bizarre  Eye Contact: Minimal  Speech: Pressured  Speech Volume: Increased  Handedness: Right   Mood and Affect  Mood:  Anxious; Euphoric; Irritable  Affect: Congruent   Thought Process  Thought Processes: Disorganized  Descriptions of Associations:Loose  Orientation:Full (Time, Place and Person)  Thought Content:Illogical  History of Schizophrenia/Schizoaffective disorder:Yes  Duration of Psychotic Symptoms:Greater than six months  Hallucinations:Hallucinations: Visual; Auditory Description of Auditory Hallucinations: Did not say Description of Visual Hallucinations: See demons  Ideas of Reference:Paranoia  Suicidal  Thoughts:Suicidal Thoughts: No  Homicidal Thoughts:Homicidal Thoughts: No   Sensorium  Memory: Immediate Fair; Recent Fair; Remote Fair  Judgment: Poor  Insight: Poor   Executive Functions  Concentration: Poor  Attention Span: Poor  Recall: Poor  Fund of Knowledge: Poor  Language: Poor   Psychomotor Activity  Psychomotor Activity: Psychomotor Activity: Increased (Pacing)   Assets  Assets: Communication Skills; Desire for Improvement; Housing; Physical Health; Resilience; Social Support   Sleep  Sleep: Sleep: Poor    Physical Exam: Physical Exam Vitals and nursing note reviewed.  Constitutional:      Appearance: Normal appearance.  HENT:     Head: Normocephalic and atraumatic.     Mouth/Throat:     Pharynx: Oropharynx is clear.  Eyes:     Pupils: Pupils are equal, round, and reactive to light.  Cardiovascular:     Rate and Rhythm: Normal rate and regular rhythm.  Pulmonary:     Effort: Pulmonary effort is normal.     Breath sounds: Normal breath sounds.  Abdominal:     General: Abdomen is flat.     Palpations: Abdomen is soft.  Musculoskeletal:        General: Normal range of motion.  Skin:    General: Skin is warm and dry.  Neurological:     General: No focal deficit present.     Mental Status: He is alert. Mental status is at baseline.  Psychiatric:        Attention and Perception: He is inattentive.        Mood and Affect: Mood normal. Affect is blunt.        Speech: Speech is tangential.        Thought Content: Thought content is delusional.    Review of Systems  Constitutional: Negative.   HENT: Negative.    Eyes: Negative.   Respiratory: Negative.    Cardiovascular: Negative.   Gastrointestinal: Negative.   Musculoskeletal: Negative.   Skin: Negative.   Neurological: Negative.   Psychiatric/Behavioral: Negative.     Blood pressure 119/75, pulse 66, temperature 98 F (36.7 C), temperature source Oral, resp. rate 17,  height 5' 10.5" (1.791 m), weight 82.6 kg, SpO2 100 %. Body mass index is 25.75 kg/m.   Treatment Plan Summary: Medication management and Plan no change to medication for today.  Support and encouragement that he attend groups.  Monitor vital signs which seem to be under good control.  He asked to have Ensure to eat and this seems like a fairly benign thing and I have ordered it.  Alethia Berthold, MD 08/20/2022, 1:43 PM
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
## 2022-08-20 NOTE — Progress Notes (Signed)
D- Patient alert and oriented x 3-4. Affect flat/mood depressed. Denies SI/ HI/ AVH. He denies pain. States he just wasn't getting along at the group home "it's just a lot of things". A- Scheduled medications administered to patient, per MD orders. PRN Nicotine gum given- effective. Insulin administered as well without signs or symptoms of hyper or hypoglycemia. Support and encouragement provided.  Routine safety checks conducted every 15 minutes. Patient informed to notify staff with problems or concerns and verbalizes understanding. R- No adverse drug reactions noted. Patient contracts for safety at this time. Patient compliant with medications and treatment plan. Patient receptive, calm, and cooperative. Patient interacts well with others on the unit.  Patient remains safe at this time.

## 2022-08-20 NOTE — Consult Note (Signed)
Pharmacy Consult - Clozapine     47 yo male ordered clozapine 250 mg PO HS  This patient's order has been reviewed for prescribing contraindications.    Clozapine [facility] enrollment Verified: yes on 08/20/22 [facility] patient ID: [id number] Current Outpatient Monitoring: Monthly    Home Regimen:  200 mg PO hs Last dose: unknown   Dose Adjustments This Admission: Clozapine increased to 250 mg po HS on admission   Labs: Date    ANC    Submitted? 08/19/22 2600 yes       Plan: Continue clozapine 250 mg po HS Monitor ANC at least weekly while inpatient    Tollie Eth III, PharmD

## 2022-08-21 DIAGNOSIS — F203 Undifferentiated schizophrenia: Secondary | ICD-10-CM | POA: Diagnosis not present

## 2022-08-21 LAB — GLUCOSE, CAPILLARY: Glucose-Capillary: 165 mg/dL — ABNORMAL HIGH (ref 70–99)

## 2022-08-21 NOTE — BHH Group Notes (Signed)
[facility] Group Notes:  (Nursing/MHT/Case Management/Adjunct)  Date:  08/21/2022  Time:  9:14 PM  Type of Therapy:  Group Therapy  Participation Level:  Minimal  Participation Quality:  Appropriate and Resistant  Affect:  Flat  Cognitive:  Alert and Lacking  Insight:  Lacking and Limited  Engagement in Group:  None  Modes of Intervention:  Discussion  Summary of Progress/Problems:  Maglione,Najla Aughenbaugh E 08/21/2022, 9:14 PM

## 2022-08-21 NOTE — Plan of Care (Signed)
  Problem: Education: Goal: Knowledge of General Education information will improve Description: Including pain rating scale, medication(s)/side effects and non-pharmacologic comfort measures Outcome: Progressing   Problem: Nutrition: Goal: Adequate nutrition will be maintained Outcome: Progressing   Problem: Coping: Goal: Level of anxiety will decrease Outcome: Progressing   

## 2022-08-21 NOTE — Group Note (Signed)
Recreation Therapy Group Note   Group Topic:Leisure Education  Group Date: 08/21/2022 Start Time: 0945 End Time: 1115 Facilitators: Leona Carry, CTRS Location:  Craft Room  Group Description: Leisure. Patients were given the option to play [place], [place] or UNO. Pts decided to play Sc[place] as a group. After multiple rounds, pts chose to also play UNO as a group. Pt's identified 2 of their leisure interests outside of the hospital and engaged in discussion on the importance of leisure activities in their daily life post-discharge.   Affect/Mood: Appropriate and Flat   Participation Level: Engaged   Participation Quality: Independent   Behavior: Appropriate and Calm   Speech/Thought Process: Coherent   Insight: Limited   Judgement: Fair    Modes of Intervention: Competitive Play   Patient Response to Interventions:  Receptive   Education Outcome:  Acknowledges education   Clinical Observations/Individualized Feedback: Lance [name] was mostly active in their participation of session activities and group discussion. Pt came to group once the group discussion on leisure interests was over, as well as the games of [name]. Pt joined in with the group and played UNO with peers. Pt was soft spoken during interactions with peers, however interacted appropriately in a group setting.   Plan: Continue to engage patient in RT group sessions 2-3x/week.   Vilma Prader, LRT, CTRS 08/21/2022 11:24 AM

## 2022-08-21 NOTE — BHH Suicide Risk Assessment (Signed)
[facility] INPATIENT:  Family/Significant Other Suicide Prevention Education  Suicide Prevention Education:  Education Completed; [name] Harris/legal guardian [phone] been identified by the patient as the family member/significant other with whom the patient will be residing, and identified as the person(s) who will aid the patient in the event of a mental health crisis (suicidal ideations/suicide attempt).  With written consent from the patient, the family member/significant other has been provided the following suicide prevention education, prior to the and/or following the discharge of the patient.  The suicide prevention education provided includes the following: Suicide risk factors Suicide prevention and interventions National Suicide Hotline telephone number [facility] assessment telephone number [facility] Emergency Assistance [place] and/or Residential Mobile Crisis Unit telephone number  Request made of family/significant other to: Remove weapons (e.g., guns, rifles, knives), all items previously/currently identified as safety concern.   Remove drugs/medications (over-the-counter, prescriptions, illicit drugs), all items previously/currently identified as a safety concern.  The family member/significant other verbalizes understanding of the suicide prevention education information provided.  The family member/significant other agrees to remove the items of safety concern listed above.  Guardian gave CSW verbal permission to contact group home and to schedule follow up with [facility]. She shared that the consents/authorization can be emailed to her at [email or web address]. Paperwork was sent over for her completion. CSW also spoke with her regarding the IM paperwork which was also emailed.   Shirl Harris 08/21/2022, 3:13 PM

## 2022-08-21 NOTE — Group Note (Unsigned)
Date:  08/21/2022 Time:  11:29 PM  Group Topic/Focus:  Coping With Mental Health Crisis:   The purpose of this group is to help patients identify strategies for coping with mental health crisis.  Group discusses possible causes of crisis and ways to manage them effectively.     Participation Level:  {BHH PARTICIPATION HD:996081  Participation Quality:  {BHH PARTICIPATION QUALITY:22265}  Affect:  {BHH AFFECT:22266}  Cognitive:  {BHH COGNITIVE:22267}  Insight: {BHH Insight2:20797}  Engagement in Group:  {BHH ENGAGEMENT IN JY:3131603  Modes of Intervention:  {BHH MODES OF INTERVENTION:22269}  Additional Comments:  ***  Olivette Beckmann 08/21/2022, 11:29 PM

## 2022-08-21 NOTE — Progress Notes (Signed)
[facility] MD Progress Note  08/21/2022 10:12 AM Lance [name]  MRN:  [id number] Subjective: Patient seen for follow-up.  Patient came to treatment team.  47 year old man with schizophrenia.  Continues to discuss delusional topics when asked but also able to stay focused on reasonable conversations and do basic self care most of the time.  Tolerating medication.  Not hostile not violent or threatening. 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   PE (pulmonary thromboembolism) ([place])    Schizo affective schizophrenia ([place])    Supratherapeutic INR 11/19/2016   History reviewed. No pertinent surgical history. Family History:  Family History  Problem Relation Age of Onset   CAD Mother    CAD Sister    Family Psychiatric  History: See previous Social History:  Social History   Substance and Sexual Activity  Alcohol Use Not Currently   Comment: occassionally     Social History   Substance and Sexual Activity  Drug Use Yes   Types: Marijuana   Comment: Last use 11/29/16    Social History   Socioeconomic History   Marital status: Single    Spouse name: Not on file   Number of children: Not on file   Years of education: Not on file   Highest education level: Not on file  Occupational History   Not on file  Tobacco Use   Smoking status: Every Day    Packs/day: 1.00     Years: 23.00    Total pack years: 23.00    Types: Cigarettes   Smokeless tobacco: Never   Tobacco comments:    will provide material  Vaping Use   Vaping Use: Some days  Substance and Sexual Activity   Alcohol use: Not Currently    Comment: occassionally   Drug use: Yes    Types: Marijuana    Comment: Last use 11/29/16   Sexual activity: Not Currently    Birth control/protection: None    Comment: occasional marijuana- none recently  Other Topics Concern   Not on file  Social History Narrative   From a group home in [place] Determinants of Health   Financial Resource Strain: Low Risk  (03/20/2021)   Overall Financial Resource Strain (CARDIA)    Difficulty of Paying Living Expenses: Not very hard  Food Insecurity: No Food Insecurity (08/19/2022)   Hunger Vital Sign    Worried About Running Out of Food in the Last Year: Never true    Ran Out of Food in the Last Year: Never true  Recent Concern: Food Insecurity - Food Insecurity Present (06/17/2022)   Hunger Vital Sign    Worried About Running Out of Food in the Last Year: Sometimes true    Ran Out of Food in the Last Year: Sometimes true  Transportation Needs: Unmet Transportation Needs (08/19/2022)   PRAPARE - Transportation    Lack of Transportation (Medical): Yes    Lack of  Transportation (Non-Medical): Yes  Physical Activity: Insufficiently Active (03/20/2021)   Exercise Vital Sign    Days of Exercise per Week: 3 days    Minutes of Exercise per Session: 20 min  Stress: No Stress Concern Present (03/20/2021)   [place]    Feeling of Stress : Only a little  Social Connections: Socially Isolated (03/20/2021)   Social Connection and Isolation Panel [NHANES]    Frequency of Communication with Friends and Family: More than three times a week    Frequency of Social Gatherings with Friends and Family: More than three times a week    Attends Religious  Services: Never    Marine scientist or Organizations: No    Attends Music therapist: Never    Marital Status: Never married   Additional Social History:                         Sleep: Fair  Appetite:  Fair  Current Medications: Current Facility-Administered Medications  Medication Dose Route Frequency Provider Last Rate Last Admin   acetaminophen (TYLENOL) tablet 650 mg  650 mg Oral Q6H PRN Bennett, Christal H, NP       albuterol (VENTOLIN HFA) 108 (90 Base) MCG/ACT inhaler 2 puff  2 puff Inhalation Q6H PRN Artha Chiasson T, MD       alum & mag hydroxide-simeth (MAALOX/MYLANTA) 200-200-20 MG/5ML suspension 30 mL  30 mL Oral Q4H PRN Bennett, Christal H, NP       apixaban (ELIQUIS) tablet 5 mg  5 mg Oral BID Quinlee Sciarra T, MD   5 mg at 08/21/22 0749   clonazePAM (KLONOPIN) disintegrating tablet 0.25 mg  0.25 mg Oral BID PRN Dontae Minerva, Madie Reno, MD       cloZAPine (CLOZARIL) tablet 250 mg  250 mg Oral QHS Ivadell Gaul T, MD   250 mg at 08/20/22 2104   feeding supplement (ENSURE ENLIVE / ENSURE PLUS) liquid 237 mL  237 mL Oral BID BM Zeke Aker T, MD   237 mL at 08/20/22 1216   insulin detemir (LEVEMIR) injection 10 Units  10 Units Subcutaneous BID Blu Mcglaun, Madie Reno, MD   10 Units at 08/21/22 0749   lisinopril (ZESTRIL) tablet 2.5 mg  2.5 mg Oral Daily Rachael Ferrie T, MD   2.5 mg at 08/20/22 0912   lithium carbonate (ESKALITH) ER tablet 450 mg  450 mg Oral QHS Hollyann Pablo T, MD   450 mg at 08/20/22 2103   magnesium hydroxide (MILK OF MAGNESIA) suspension 30 mL  30 mL Oral Daily PRN Bennett, Christal H, NP       metFORMIN (GLUCOPHAGE) tablet 1,000 mg  1,000 mg Oral BID WC Dianah Pruett T, MD   1,000 mg at 08/21/22 0749   mometasone-formoterol (DULERA) 200-5 MCG/ACT inhaler 2 puff  2 puff Inhalation BID Briscoe Daniello, Madie Reno, MD   2 puff at 08/21/22 0749   nicotine polacrilex (NICORETTE) gum 2 mg  2 mg Oral PRN Melvia Matousek, Madie Reno, MD   2 mg at 08/21/22 0829   OLANZapine  (ZYPREXA) tablet 20 mg  20 mg Oral QHS Trevionne Advani T, MD   20 mg at 08/20/22 2104   propranolol (INDERAL) tablet 20 mg  20 mg Oral QHS Jessamine Barcia T, MD   20 mg at 08/20/22 2105   sertraline (ZOLOFT) tablet 100 mg  100 mg Oral Daily Mavery Milling, Madie Reno, MD   100 mg  at 08/21/22 0749   simvastatin (ZOCOR) tablet 40 mg  40 mg Oral q1800 Jameela Michna, Madie Reno, MD   40 mg at 08/20/22 1747   traZODone (DESYREL) tablet 100 mg  100 mg Oral QHS Eulises Kijowski, Madie Reno, MD   100 mg at 08/20/22 2104    Lab Results:  Results for orders placed or performed during the hospital encounter of 08/19/22 (from the past 48 hour(s))  CBC with Differential/Platelet     Status: None   Collection Time: 08/19/22 11:23 AM  Result Value Ref Range   WBC 4.6 4.0 - 10.5 K/uL   RBC 4.66 4.22 - 5.81 MIL/uL   Hemoglobin 14.2 13.0 - 17.0 g/dL   HCT 42.0 39.0 - 52.0 %   MCV 90.1 80.0 - 100.0 fL   MCH 30.5 26.0 - 34.0 pg   MCHC 33.8 30.0 - 36.0 g/dL   RDW 13.3 11.5 - 15.5 %   Platelets 225 150 - 400 K/uL   nRBC 0.0 0.0 - 0.2 %   Neutrophils Relative % 55 %   Neutro Abs 2.6 1.7 - 7.7 K/uL   Lymphocytes Relative 31 %   Lymphs Abs 1.4 0.7 - 4.0 K/uL   Monocytes Relative 14 %   Monocytes Absolute 0.6 0.1 - 1.0 K/uL   Eosinophils Relative 0 %   Eosinophils Absolute 0.0 0.0 - 0.5 K/uL   Basophils Relative 0 %   Basophils Absolute 0.0 0.0 - 0.1 K/uL   Immature Granulocytes 0 %   Abs Immature Granulocytes 0.02 0.00 - 0.07 K/uL    Comment: Performed at [facility], [address] [place], [place] [address]  Glucose, capillary     Status: Abnormal   Collection Time: 08/19/22  4:56 PM  Result Value Ref Range   Glucose-Capillary 136 (H) 70 - 99 mg/dL    Comment: Glucose reference range applies only to samples taken after fasting for at least 8 hours.  Glucose, capillary     Status: Abnormal   Collection Time: 08/21/22  7:48 AM  Result Value Ref Range   Glucose-Capillary 165 (H) 70 - 99 mg/dL    Comment: Glucose reference  range applies only to samples taken after fasting for at least 8 hours.    Blood Alcohol level:  Lab Results  Component Value Date   ETH <10 08/18/2022   ETH <10 123456    Metabolic Disorder Labs: Lab Results  Component Value Date   HGBA1C 4.9 04/26/2022   MPG 93.93 04/26/2022   MPG 108 11/12/2020   Lab Results  Component Value Date   PROLACTIN 2.0 (L) 08/31/2016   PROLACTIN 4.8 05/22/2016   Lab Results  Component Value Date   CHOL 163 04/28/2022   TRIG 378 (H) 04/28/2022   HDL 35 (L) 04/28/2022   CHOLHDL 4.7 04/28/2022   VLDL 76 (H) 04/28/2022   LDLCALC 52 04/28/2022   LDLCALC UNABLE TO CALCULATE IF TRIGLYCERIDE OVER 400 mg/dL 09/20/2020    Physical Findings: AIMS: Facial and Oral Movements Muscles of Facial Expression: None, normal Lips and Perioral Area: None, normal Jaw: None, normal Tongue: None, normal,Extremity Movements Upper (arms, wrists, hands, fingers): None, normal Lower (legs, knees, ankles, toes): None, normal, Trunk Movements Neck, shoulders, hips: None, normal, Overall Severity Severity of abnormal movements (highest score from questions above): None, normal Incapacitation due to abnormal movements: None, normal Patient's awareness of abnormal movements (rate only patient's report): No Awareness, Dental Status Current problems with teeth and/or dentures?: No Does patient usually wear dentures?:  No  CIWA:    COWS:     Musculoskeletal: Strength & Muscle Tone: within normal limits Gait & Station: normal Patient leans: N/A  Psychiatric Specialty Exam:  Presentation  General Appearance:  Bizarre  Eye Contact: Minimal  Speech: Pressured  Speech Volume: Increased  Handedness: Right   Mood and Affect  Mood: Anxious; Euphoric; Irritable  Affect: Congruent   Thought Process  Thought Processes: Disorganized  Descriptions of Associations:Loose  Orientation:Full (Time, Place and Person)  Thought  Content:Illogical  History of Schizophrenia/Schizoaffective disorder:Yes  Duration of Psychotic Symptoms:Greater than six months  Hallucinations:No data recorded Ideas of Reference:Paranoia  Suicidal Thoughts:No data recorded Homicidal Thoughts:No data recorded  Sensorium  Memory: Immediate Fair; Recent Fair; Remote Fair  Judgment: Poor  Insight: Poor   Executive Functions  Concentration: Poor  Attention Span: Poor  Recall: Poor  Fund of Knowledge: Poor  Language: Poor   Psychomotor Activity  Psychomotor Activity:No data recorded  Assets  Assets: Communication Skills; Desire for Improvement; Housing; Physical Health; Resilience; Social Support   Sleep  Sleep:No data recorded   Physical Exam: Physical Exam Vitals and nursing note reviewed.  Constitutional:      Appearance: Normal appearance.  HENT:     Head: Normocephalic and atraumatic.     Mouth/Throat:     Pharynx: Oropharynx is clear.  Eyes:     Pupils: Pupils are equal, round, and reactive to light.  Cardiovascular:     Rate and Rhythm: Normal rate and regular rhythm.  Pulmonary:     Effort: Pulmonary effort is normal.     Breath sounds: Normal breath sounds.  Abdominal:     General: Abdomen is flat.     Palpations: Abdomen is soft.  Musculoskeletal:        General: Normal range of motion.  Skin:    General: Skin is warm and dry.  Neurological:     General: No focal deficit present.     Mental Status: He is alert. Mental status is at baseline.  Psychiatric:        Attention and Perception: Attention normal.        Mood and Affect: Mood normal. Affect is blunt.        Speech: Speech is tangential.        Behavior: Behavior is slowed.        Thought Content: Thought content is paranoid and delusional.        Cognition and Memory: Cognition is impaired.    Review of Systems  Constitutional: Negative.   HENT: Negative.    Eyes: Negative.   Respiratory: Negative.     Cardiovascular: Negative.   Gastrointestinal: Negative.   Musculoskeletal: Negative.   Skin: Negative.   Neurological: Negative.   Psychiatric/Behavioral:  Positive for hallucinations. Negative for depression, substance abuse and suicidal ideas. The patient is nervous/anxious.    Blood pressure 92/74, pulse 64, temperature (!) 97.5 F (36.4 C), temperature source Oral, resp. rate 18, height 5' 10.5" (1.791 m), weight 82.6 kg, SpO2 100 %. Body mass index is 25.75 kg/m.   Treatment Plan Summary: Medication management and Plan no change to medication management today.  Over the weekend I will probably suggest increasing his clozapine again.  Supportive counseling encourage group attendance and interaction with others.  Possible discharge after the weekend  Alethia Berthold, MD 08/21/2022, 10:12 AM

## 2022-08-21 NOTE — Progress Notes (Signed)
Pt denies SI/HI/AVH and verbally agrees to approach staff if these become apparent or before harming themselves/others. Rates depression 5/10. Rates anxiety 0/10. Rates pain 0/10. Pt has gone to groups. Pt will pace the unit at times or be in the dayroom. Pt stated that he was the devil and talking about religion. Pt reassured. Scheduled medications administered to pt, per MD orders. RN provided support and encouragement to pt. Q15 min safety checks implemented and continued. Pt safe on the unit. RN will continue to monitor and intervene as needed.  08/21/22 0749  Psych Admission Type (Psych Patients Only)  Admission Status Involuntary  Psychosocial Assessment  Patient Complaints Depression  Eye Contact Fair  Facial Expression Flat  Affect Appropriate to circumstance  Speech Logical/coherent  Interaction Assertive  Motor Activity Pacing;Slow  Appearance/Hygiene Unremarkable  Behavior Characteristics Cooperative;Appropriate to situation;Anxious  Mood Preoccupied  Aggressive Behavior  Effect No apparent injury  Thought Process  Coherency WDL  Content Preoccupation;Religiosity  Delusions Religious  Perception Derealization  Hallucination None reported or observed  Judgment Limited  Confusion None  Danger to Self  Current suicidal ideation? Denies  Danger to Others  Danger to Others None reported or observed

## 2022-08-21 NOTE — Group Note (Signed)
LCSW Group Therapy Note    Group Date: 08/21/2022 Start Time: 1400 End Time: 1500   Type of Therapy and Topic: Group Therapy: Body Image  Participation Level:  None  Description of Group:  Patients were educated about body image and asked to think about whether they have a healthy or unhealthy body image. Patients were led in a discussion about factors that contribute to body image, both internal and external. Patients were asked to discuss strengths of the human body outside of appearance, such as being able to fight off diseases and provide stress relief. Lastly, patients were asked to identify one way in which they appreciate their own body outside of appearance.   Therapeutic Goals:   1. Patient will differentiate between a healthy and unhealthy body image. 2. Patient will identify what contributes to body image 3. Patient will discuss the strengths of the human body. 4. Patient will identify a positive attribute of their body outside of physical appearance.  Summary of Patient Progress:  Patient was present in group at the beginning, however, patient declined to participate further. Patient left group early.    Therapeutic Modalities: Cognitive Behavioral Therapy; Solution-Focused Therapy  Maryjane Hurter 08/21/2022  3:18 PM

## 2022-08-21 NOTE — BH IP Treatment Plan (Signed)
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[place]) Active Problems:   Hypertension   Diabetes mellitus without complication ([place])   Hyperlipidemia   Chronic anticoagulation   Tobacco use disorder   Insomnia   Current Medications:  Current Facility-Administered Medications  Medication Dose Route Frequency Provider Last Rate Last Admin   acetaminophen (TYLENOL) tablet 650 mg  650 mg Oral Q6H PRN Bennett, Christal H, NP       albuterol (VENTOLIN HFA) 108 (90 Base) MCG/ACT inhaler 2 puff  2 puff Inhalation Q6H PRN Clapacs, [name], MD       alum & mag hydroxide-simeth (MAALOX/MYLANTA) 200-200-20 MG/5ML suspension 30 mL  30 mL Oral Q4H PRN Bennett, Christal H, NP       apixaban (ELIQUIS) tablet 5 mg  5 mg Oral BID Clapacs, [name], MD   5 mg at 08/21/22 0749   clonazePAM (KLONOPIN) disintegrating tablet 0.25 mg  0.25 mg Oral BID PRN Clapacs, Madie Reno, MD       cloZAPine (CLOZARIL) tablet 250 mg  250 mg Oral QHS Clapacs, [name], MD   250 mg at 08/20/22 2104   feeding supplement (ENSURE ENLIVE / ENSURE PLUS) liquid 237 mL  237 mL Oral BID BM Clapacs, [name], MD   237 mL at 08/21/22 1038   insulin detemir (LEVEMIR) injection 10 Units  10 Units Subcutaneous BID Clapacs, Madie Reno, MD   10 Units at 08/21/22 0749   lisinopril (ZESTRIL) tablet 2.5 mg  2.5 mg Oral Daily Clapacs, [name], MD   2.5 mg at 08/20/22 0912   lithium carbonate (ESKALITH) ER tablet 450 mg  450 mg Oral QHS Clapacs, [name], MD   450 mg at 08/20/22 2103   magnesium hydroxide (MILK OF MAGNESIA) suspension 30 mL  30 mL Oral Daily PRN Bennett, Christal H, NP       metFORMIN (GLUCOPHAGE) tablet 1,000 mg  1,000 mg Oral BID WC Clapacs, [name], MD   1,000 mg at 08/21/22 0749   mometasone-formoterol (DULERA) 200-5  MCG/ACT inhaler 2 puff  2 puff Inhalation BID Clapacs, Madie Reno, MD   2 puff at 08/21/22 0749   nicotine polacrilex (NICORETTE) gum 2 mg  2 mg Oral PRN Clapacs, Madie Reno, MD   2 mg at 08/21/22 0829   OLANZapine (ZYPREXA) tablet 20 mg  20 mg Oral QHS Clapacs, [name], MD   20 mg at 08/20/22 2104   propranolol (INDERAL) tablet 20 mg  20 mg Oral QHS Clapacs, [name], MD   20 mg at 08/20/22 2105   sertraline (ZOLOFT) tablet 100 mg  100 mg Oral Daily Clapacs, [name], MD   100 mg at 08/21/22 0749   simvastatin (ZOCOR) tablet 40 mg  40 mg Oral q1800 Clapacs, [name], MD   40 mg at 08/20/22 1747   traZODone (DESYREL) tablet 100 mg  100 mg Oral QHS Clapacs, [name], MD   100 mg at 08/20/22 2104   PTA Medications: Medications Prior to Admission  Medication Sig Dispense Refill Last Dose   albuterol (VENTOLIN HFA) 108 (90 Base) MCG/ACT inhaler Inhale 1-2 puffs into the lungs every 6 (six) hours as needed for wheezing or shortness of breath. 18 g 1 Past Week   apixaban (ELIQUIS) 5 MG TABS tablet Take 1 tablet (5 mg total) by mouth 2 (  two) times daily. 60 tablet 1 Past Week   clonazePAM (KLONOPIN) 0.25 MG disintegrating tablet Take 1 tablet (0.25 mg total) by mouth 2 (two) times daily as needed (anxiety). 60 tablet 1 Past Week   clozapine (CLOZARIL) 200 MG tablet Take 1 tablet (200 mg total) by mouth at bedtime. 30 tablet 1 Past Week   fluticasone-salmeterol (ADVAIR) 250-50 MCG/ACT AEPB Inhale 1 puff into the lungs 2 (two) times daily. 60 each 1 Past Week   hydrOXYzine (ATARAX) 25 MG tablet Take 25 mg by mouth every 6 (six) hours as needed for anxiety.   Past Week   insulin detemir (LEVEMIR) 100 UNIT/ML injection Inject 0.1 mLs (10 Units total) into the skin 2 (two) times daily. 10 mL 1 Past Week   lisinopril (ZESTRIL) 2.5 MG tablet Take 1 tablet (2.5 mg total) by mouth daily. 30 tablet 1 Past Week   lithium carbonate (ESKALITH) 450 MG ER tablet Take 1 tablet (450 mg total) by mouth at bedtime. 30 tablet 1 Past Week    metFORMIN (GLUCOPHAGE) 1000 MG tablet Take 1 tablet (1,000 mg total) by mouth 2 (two) times daily with a meal. 60 tablet 1 Past Week   nicotine polacrilex (NICORETTE) 2 MG gum Take 1 each (2 mg total) by mouth every 2 (two) hours as needed for smoking cessation. 100 tablet 0 Past Week   OLANZapine (ZYPREXA) 20 MG tablet Take 1 tablet (20 mg total) by mouth at bedtime. 30 tablet 1 Past Week   propranolol (INDERAL) 20 MG tablet Take 1 tablet (20 mg total) by mouth at bedtime. 30 tablet 1 Past Week   sertraline (ZOLOFT) 100 MG tablet Take 1 tablet (100 mg total) by mouth daily. 30 tablet 1 Past Week   simvastatin (ZOCOR) 40 MG tablet Take 1 tablet (40 mg total) by mouth daily at 6 PM. 30 tablet 1 Past Week   traZODone (DESYREL) 100 MG tablet Take 1 tablet (100 mg total) by mouth at bedtime. 30 tablet 1 Past Week   blood glucose meter kit and supplies Dispense based on patient and insurance preference. Use up to four times daily as directed. (FOR ICD-10 E10.9, E11.9). 1 each 6    glucose blood (FORA V30A BLOOD GLUCOSE TEST) test strip CHECK BLOOD SUGAR TWICE DAILY. 100 strip 3    hydrOXYzine (ATARAX) 50 MG tablet Take 1 tablet (50 mg total) by mouth every 6 (six) hours as needed for anxiety. (Patient not taking: Reported on 08/20/2022) 60 tablet 1 Not Taking   NOVOFINE AUTOCOVER PEN NEEDLE 30G X 8 MM MISC USE WITH LEVEMIR TWICE DAILY. 100 each 11     Patient Stressors: Medication change or noncompliance   Other: "Religious stuff"    Patient Strengths: Ability for insight  Motivation for treatment/growth  Religious Affiliation   Treatment Modalities: Medication Management, Group therapy, Case management,  1 to 1 session with clinician, Psychoeducation, Recreational therapy.   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  Therapeutic Interventions: 1 to 1 sessions, Unit Group sessions and Medication administration.  Evaluation of Outcomes: Progressing  Physician Treatment Plan for Secondary Diagnosis: Principal Problem:   Schizophrenia, 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  [place]) Active Problems:   Hypertension   Diabetes mellitus without complication ([place])   Hyperlipidemia   Chronic anticoagulation   Tobacco use disorder   Insomnia  Long Term Goal(s): Improvement in symptoms so as ready for discharge   Short Term Goals: Compliance with prescribed medications will improve Ability to verbalize feelings will improve Ability to demonstrate self-control will improve Ability to identify and develop effective coping behaviors will improve     Medication Management: Evaluate patient's response, side effects, and tolerance of medication regimen.  Therapeutic Interventions: 1 to 1 sessions, Unit Group sessions and Medication administration.  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[place]) Long Term Goal(s): Knowledge of disease and therapeutic regimen to maintain health will improve  Short Term Goals: Ability to remain free from injury will improve, Ability to verbalize frustration and anger appropriately will improve, Ability to demonstrate self-control, Ability to participate in decision making will improve, Ability to verbalize feelings will improve, Ability to disclose and discuss suicidal ideas, Ability to identify and develop effective coping behaviors will improve, and Compliance with prescribed medications will improve  Medication Management: RN will administer  medications as ordered by provider, will assess and evaluate patient's response and provide education to patient for prescribed medication. RN will report any adverse and/or side effects to prescribing provider.  Therapeutic Interventions: 1 on 1 counseling sessions, Psychoeducation, Medication administration, Evaluate responses to treatment, Monitor vital signs and CBGs as ordered, Perform/monitor CIWA, COWS, AIMS and Fall Risk screenings as ordered, Perform wound care treatments as ordered.  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  Short Term Goals: Engage patient in aftercare planning with referrals and resources, Increase social support, Increase ability to appropriately verbalize feelings, Increase emotional regulation, Facilitate acceptance of mental health diagnosis and concerns, Facilitate patient progression through stages of change regarding substance use diagnoses and concerns, Identify triggers associated with mental health/substance abuse issues, and Increase skills for wellness and recovery  Therapeutic Interventions: Assess for all discharge needs, 1 to 1 time with Social worker, Explore available resources and support systems, Assess for adequacy in community support network, Educate family and significant other(s) on suicide prevention, Complete Psychosocial Assessment, Interpersonal group therapy.  Evaluation of Outcomes: Progressing   Progress in Treatment: Attending groups: Yes. Participating in groups: Yes. Taking medication as prescribed: Yes. Toleration medication: Yes. Family/Significant other contact made: Yes, individual(s) contacted:  guardian, Timoteo Ace. Patient understands diagnosis: Yes. Discussing patient identified  problems/goals with staff: Yes. Medical problems stabilized or resolved: Yes. Denies suicidal/homicidal ideation: No. Issues/concerns per patient self-inventory: No. Other: none.  New problem(s) identified: No, Describe:  none identified.  New Short Term/Long Term Goal(s): elimination of symptoms of psychosis, medication management for mood stabilization; elimination of SI thoughts; development of comprehensive mental wellness/sobriety plan.  Patient Goals:  "Get me back regulated on my medication, get some rest, and get out of here without having to come back for awhile."  Discharge Plan or Barriers: CSW will assist pt with development of an appropriate aftercare/discharge plan.   Reason for Continuation of Hospitalization: Delusions  Medication stabilization Suicidal ideation  Estimated Length of Stay: 1-7 days  Last 3 [place] Suicide Severity Risk Score: Flowsheet Row Admission (Current) from 08/19/2022 in [facility] ED from 08/18/2022 in [facility] Emergency  Department at [facility] ED from 07/09/2022 in [facility] Emergency Department at [facility] No Risk High Risk Error: Q3, 4, or 5 should not be populated when Q2 is No       Last PHQ 2/9 Scores:    03/20/2021   11:14 AM 03/20/2021   11:04 AM  Depression screen PHQ 2/9  Decreased Interest 0 0  Down, Depressed, Hopeless 0 0  PHQ - 2 Score 0 0    Scribe for Treatment Team: Shirl Harris, LCSW 08/21/2022 11:24 AM

## 2022-08-22 DIAGNOSIS — F203 Undifferentiated schizophrenia: Secondary | ICD-10-CM | POA: Diagnosis not present

## 2022-08-22 NOTE — [facility] Group Notes (Signed)
LCSW Group Therapy Note  08/22/2022 1:15pm  Type of Therapy/Topic:  Group Therapy:  Balance in Life  Participation Level:  None  Description of Group:    This group will address the concept of balance and how it feels and looks when one is unbalanced. Patients will be encouraged to process areas in their lives that are out of balance and identify reasons for remaining unbalanced. Facilitators will guide patients in utilizing problem-solving interventions to address and correct the stressor making their life unbalanced. Understanding and applying boundaries will be explored and addressed for obtaining and maintaining a balanced life. Patients will be encouraged to explore ways to assertively make their unbalanced needs known to significant others in their lives, using other group members and facilitator for support and feedback.  Therapeutic Goals: Patient will identify two or more emotions or situations they have that consume much of in their lives. Patient will identify signs/triggers that life has become out of balance:  Patient will identify two ways to set boundaries in order to achieve balance in their lives:  Patient will demonstrate ability to communicate their needs through discussion and/or role plays  Summary of Patient Progress:Pt came to beginning of group but left shortly after it started without making any comments.       Therapeutic Modalities:   Cognitive Behavioral Therapy Solution-Focused Therapy Assertiveness Training  Deirdre Evener 08/22/2022 2:36 PM

## 2022-08-22 NOTE — Progress Notes (Signed)
D- Patient alert and oriented x 3-4. Affect flat/mood sad. Denies SI/ HI/ AVH. He denies pain. Pacing some in halls during the shift. A- Scheduled medications administered to patient, per MD orders. No signs or symptoms hyper or hypo glycemia. Support and encouragement provided.  Routine safety checks conducted every 15 minutes.  Patient informed to notify staff with problems or concerns and verbalizes understanding. R- No adverse drug reactions noted. Patient compliant with medications and treatment plan. Patient receptive, calm, and cooperative. Patient has been quiet and isolative on the unit except for meals. Patient contracts for safety and remains safe on the unit at this time.

## 2022-08-22 NOTE — Progress Notes (Signed)
This Probation officer contacted Pharmacy regarding patient's scheduled Levemir. It was stated that the medication was not yet available and once it was, one of their techs would bring it down. This Probation officer moved dose to 1830.

## 2022-08-22 NOTE — Progress Notes (Signed)
[facility] MD Progress Note  08/22/2022 10:43 AM Lance [name]  MRN:  [id number] Subjective: Lance [name] has no issues.  Nurses report no problems.  Been compliant with his medication and denies any side effects.  Been pleasant and cooperative. 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   PE (pulmonary thromboembolism) ([place])    Schizo affective schizophrenia ([place])    Supratherapeutic INR 11/19/2016   History reviewed. No pertinent surgical history. Family History:  Family History  Problem Relation Age of Onset   CAD Mother    CAD Sister    Family Psychiatric  History: Unremarkable Social History:  Social History   Substance and Sexual Activity  Alcohol Use Not Currently   Comment: occassionally     Social History   Substance and Sexual Activity  Drug Use Yes   Types: Marijuana   Comment: Last use 11/29/16    Social History   Socioeconomic History   Marital status: Single    Spouse name: Not on file   Number of children: Not on file   Years of education: Not on file   Highest education level: Not on file  Occupational History   Not on file  Tobacco Use   Smoking status: Every Day    Packs/day: 1.00    Years: 23.00    Total pack years: 23.00    Types: Cigarettes   Smokeless tobacco: Never   Tobacco comments:    will provide material  Vaping Use   Vaping Use: Some days  Substance and Sexual  Activity   Alcohol use: Not Currently    Comment: occassionally   Drug use: Yes    Types: Marijuana    Comment: Last use 11/29/16   Sexual activity: Not Currently    Birth control/protection: None    Comment: occasional marijuana- none recently  Other Topics Concern   Not on file  Social History Narrative   From a group home in [place] Determinants of Health   Financial Resource Strain: Low Risk  (03/20/2021)   Overall Financial Resource Strain (CARDIA)    Difficulty of Paying Living Expenses: Not very hard  Food Insecurity: No Food Insecurity (08/19/2022)   Hunger Vital Sign    Worried About Running Out of Food in the Last Year: Never true    Ran Out of Food in the Last Year: Never true  Recent Concern: Food Insecurity - Food Insecurity Present (06/17/2022)   Hunger Vital Sign    Worried About Running Out of Food in the Last Year: Sometimes true    Ran Out of Food in the Last Year: Sometimes true  Transportation Needs: Unmet Transportation Needs (08/19/2022)   PRAPARE - Transportation    Lack of Transportation (Medical): Yes    Lack of Transportation (Non-Medical): Yes  Physical Activity: Insufficiently Active (03/20/2021)   Exercise Vital Sign    Days of Exercise per Week: 3 days    Minutes of Exercise per  Session: 20 min  Stress: No Stress Concern Present (03/20/2021)   [place]    Feeling of Stress : Only a little  Social Connections: Socially Isolated (03/20/2021)   Social Connection and Isolation Panel [NHANES]    Frequency of Communication with Friends and Family: More than three times a week    Frequency of Social Gatherings with Friends and Family: More than three times a week    Attends Religious Services: Never    Marine scientist or Organizations: No    Attends Music therapist: Never    Marital Status: Never married   Additional Social History:                          Sleep: Good  Appetite:  Good  Current Medications: Current Facility-Administered Medications  Medication Dose Route Frequency Provider Last Rate Last Admin   acetaminophen (TYLENOL) tablet 650 mg  650 mg Oral Q6H PRN Bennett, Christal H, NP       albuterol (VENTOLIN HFA) 108 (90 Base) MCG/ACT inhaler 2 puff  2 puff Inhalation Q6H PRN Clapacs, [name], MD       alum & mag hydroxide-simeth (MAALOX/MYLANTA) 200-200-20 MG/5ML suspension 30 mL  30 mL Oral Q4H PRN Bennett, Christal H, NP       apixaban (ELIQUIS) tablet 5 mg  5 mg Oral BID Clapacs, [name], MD   5 mg at 08/22/22 0840   clonazePAM (KLONOPIN) disintegrating tablet 0.25 mg  0.25 mg Oral BID PRN Clapacs, Madie Reno, MD       cloZAPine (CLOZARIL) tablet 250 mg  250 mg Oral QHS Clapacs, [name], MD   250 mg at 08/21/22 2053   feeding supplement (ENSURE ENLIVE / ENSURE PLUS) liquid 237 mL  237 mL Oral BID BM Clapacs, [name], MD   237 mL at 08/22/22 0840   insulin detemir (LEVEMIR) injection 10 Units  10 Units Subcutaneous BID Clapacs, Madie Reno, MD   10 Units at 08/22/22 0837   lisinopril (ZESTRIL) tablet 2.5 mg  2.5 mg Oral Daily Clapacs, [name], MD   2.5 mg at 08/20/22 0912   lithium carbonate (ESKALITH) ER tablet 450 mg  450 mg Oral QHS Clapacs, [name], MD   450 mg at 08/21/22 2057   magnesium hydroxide (MILK OF MAGNESIA) suspension 30 mL  30 mL Oral Daily PRN Bennett, Christal H, NP       metFORMIN (GLUCOPHAGE) tablet 1,000 mg  1,000 mg Oral BID WC Clapacs, [name], MD   1,000 mg at 08/22/22 0839   mometasone-formoterol (DULERA) 200-5 MCG/ACT inhaler 2 puff  2 puff Inhalation BID Clapacs, Madie Reno, MD   2 puff at 08/22/22 0839   nicotine polacrilex (NICORETTE) gum 2 mg  2 mg Oral PRN Clapacs, [name], MD   2 mg at 08/21/22 1652   OLANZapine (ZYPREXA) tablet 20 mg  20 mg Oral QHS Clapacs, [name], MD   20 mg at 08/21/22 2056   propranolol (INDERAL) tablet 20 mg  20 mg Oral QHS Clapacs, [name], MD   20 mg at 08/21/22 2122   sertraline  (ZOLOFT) tablet 100 mg  100 mg Oral Daily Clapacs, [name], MD   100 mg at 08/22/22 0840   simvastatin (ZOCOR) tablet 40 mg  40 mg Oral q1800 Clapacs, Madie Reno, MD   40 mg at 08/21/22 1651   traZODone (DESYREL) tablet  100 mg  100 mg Oral QHS Clapacs, [name], MD   100 mg at 08/21/22 2052    Lab Results:  Results for orders placed or performed during the hospital encounter of 08/19/22 (from the past 48 hour(s))  Glucose, capillary     Status: Abnormal   Collection Time: 08/21/22  7:48 AM  Result Value Ref Range   Glucose-Capillary 165 (H) 70 - 99 mg/dL    Comment: Glucose reference range applies only to samples taken after fasting for at least 8 hours.    Blood Alcohol level:  Lab Results  Component Value Date   ETH <10 08/18/2022   ETH <10 123456    Metabolic Disorder Labs: Lab Results  Component Value Date   HGBA1C 4.9 04/26/2022   MPG 93.93 04/26/2022   MPG 108 11/12/2020   Lab Results  Component Value Date   PROLACTIN 2.0 (L) 08/31/2016   PROLACTIN 4.8 05/22/2016   Lab Results  Component Value Date   CHOL 163 04/28/2022   TRIG 378 (H) 04/28/2022   HDL 35 (L) 04/28/2022   CHOLHDL 4.7 04/28/2022   VLDL 76 (H) 04/28/2022   LDLCALC 52 04/28/2022   LDLCALC UNABLE TO CALCULATE IF TRIGLYCERIDE OVER 400 mg/dL 09/20/2020    Physical Findings: AIMS: Facial and Oral Movements Muscles of Facial Expression: None, normal Lips and Perioral Area: None, normal Jaw: None, normal Tongue: None, normal,Extremity Movements Upper (arms, wrists, hands, fingers): None, normal Lower (legs, knees, ankles, toes): None, normal, Trunk Movements Neck, shoulders, hips: None, normal, Overall Severity Severity of abnormal movements (highest score from questions above): None, normal Incapacitation due to abnormal movements: None, normal Patient's awareness of abnormal movements (rate only patient's report): No Awareness, Dental Status Current problems with teeth and/or dentures?: No Does  patient usually wear dentures?: No  CIWA:    COWS:     Musculoskeletal: Strength & Muscle Tone: within normal limits Gait & Station: normal Patient leans: N/A  Psychiatric Specialty Exam:  Presentation  General Appearance:  Bizarre  Eye Contact: Minimal  Speech: Pressured  Speech Volume: Increased  Handedness: Right   Mood and Affect  Mood: Anxious; Euphoric; Irritable  Affect: Congruent   Thought Process  Thought Processes: Disorganized  Descriptions of Associations:Loose  Orientation:Full (Time, Place and Person)  Thought Content:Illogical  History of Schizophrenia/Schizoaffective disorder:Yes  Duration of Psychotic Symptoms:Greater than six months  Hallucinations:No data recorded Ideas of Reference:Paranoia  Suicidal Thoughts:No data recorded Homicidal Thoughts:No data recorded  Sensorium  Memory: Immediate Fair; Recent Fair; Remote Fair  Judgment: Poor  Insight: Poor   Executive Functions  Concentration: Poor  Attention Span: Poor  Recall: Poor  Fund of Knowledge: Poor  Language: Poor   Psychomotor Activity  Psychomotor Activity:No data recorded  Assets  Assets: Communication Skills; Desire for Improvement; Housing; Physical Health; Resilience; Social Support   Sleep  Sleep:No data recorded    Blood pressure (!) 104/56, pulse 60, temperature 98.9 F (37.2 C), resp. rate 18, height 5' 10.5" (1.791 m), weight 82.6 kg, SpO2 100 %. Body mass index is 25.75 kg/m.   Treatment Plan Summary: Daily contact with patient to assess and evaluate symptoms and progress in treatment, Medication management, and Plan continue current medications.  Parks Ranger, DO 08/22/2022, 10:43 AM

## 2022-08-22 NOTE — Progress Notes (Signed)
No distress noted interacting appropriately with peers and staff. 15 minutes safety checks maintained

## 2022-08-22 NOTE — Plan of Care (Signed)
  Problem: Education: Goal: Knowledge of General Education information will improve Description: Including pain rating scale, medication(s)/side effects and non-pharmacologic comfort measures Outcome: Progressing   Problem: Health Behavior/Discharge Planning: Goal: Ability to manage health-related needs will improve Outcome: Progressing   Problem: Clinical Measurements: Goal: Ability to maintain clinical measurements within normal limits will improve Outcome: Progressing Goal: Will remain free from infection Outcome: Progressing Goal: Diagnostic test results will improve Outcome: Progressing Goal: Respiratory complications will improve Outcome: Progressing Goal: Cardiovascular complication will be avoided Outcome: Progressing   Problem: Activity: Goal: Risk for activity intolerance will decrease Outcome: Progressing   Problem: Nutrition: Goal: Adequate nutrition will be maintained Outcome: Progressing   Problem: Coping: Goal: Level of anxiety will decrease Outcome: Progressing   Problem: Elimination: Goal: Will not experience complications related to bowel motility Outcome: Progressing Goal: Will not experience complications related to urinary retention Outcome: Progressing   Problem: Pain Managment: Goal: General experience of comfort will improve Outcome: Progressing   Problem: Safety: Goal: Ability to remain free from injury will improve Outcome: Progressing   Problem: Skin Integrity: Goal: Risk for impaired skin integrity will decrease Outcome: Progressing   Problem: Education: Goal: Utilization of techniques to improve thought processes will improve Outcome: Progressing Goal: Knowledge of the prescribed therapeutic regimen will improve Outcome: Progressing   Problem: Activity: Goal: Interest or engagement in leisure activities will improve Outcome: Progressing Goal: Imbalance in normal sleep/wake cycle will improve Outcome: Progressing   Problem:  Coping: Goal: Coping ability will improve Outcome: Progressing Goal: Will verbalize feelings Outcome: Progressing   Problem: Health Behavior/Discharge Planning: Goal: Ability to make decisions will improve Outcome: Progressing Goal: Compliance with therapeutic regimen will improve Outcome: Progressing   Problem: Role Relationship: Goal: Will demonstrate positive changes in social behaviors and relationships Outcome: Progressing   Problem: Safety: Goal: Ability to disclose and discuss suicidal ideas will improve Outcome: Progressing Goal: Ability to identify and utilize support systems that promote safety will improve Outcome: Progressing   Problem: Self-Concept: Goal: Will verbalize positive feelings about self Outcome: Progressing Goal: Level of anxiety will decrease Outcome: Progressing   Problem: Education: Goal: Knowledge of [place] [facility] Education information/materials will improve Outcome: Progressing Goal: Emotional status will improve Outcome: Progressing Goal: Mental status will improve Outcome: Progressing Goal: Verbalization of understanding the information provided will improve Outcome: Progressing   Problem: Activity: Goal: Interest or engagement in activities will improve Outcome: Progressing Goal: Sleeping patterns will improve Outcome: Progressing   Problem: Coping: Goal: Ability to verbalize frustrations and anger appropriately will improve Outcome: Progressing Goal: Ability to demonstrate self-control will improve Outcome: Progressing   Problem: Health Behavior/Discharge Planning: Goal: Identification of resources available to assist in meeting health care needs will improve Outcome: Progressing Goal: Compliance with treatment plan for underlying cause of condition will improve Outcome: Progressing   Problem: Physical Regulation: Goal: Ability to maintain clinical measurements within normal limits will improve Outcome: Progressing    Problem: Safety: Goal: Periods of time without injury will increase Outcome: Progressing   Problem: Education: Goal: Ability to state activities that reduce stress will improve Outcome: Progressing   Problem: Coping: Goal: Ability to identify and develop effective coping behavior will improve Outcome: Progressing   Problem: Self-Concept: Goal: Ability to identify factors that promote anxiety will improve Outcome: Progressing Goal: Level of anxiety will decrease Outcome: Progressing Goal: Ability to modify response to factors that promote anxiety will improve Outcome: Progressing   

## 2022-08-23 DIAGNOSIS — F203 Undifferentiated schizophrenia: Secondary | ICD-10-CM | POA: Diagnosis not present

## 2022-08-23 NOTE — Progress Notes (Signed)
[facility] MD Progress Note  08/23/2022 10:59 AM Lance [name]  MRN:  [id number] Subjective: Lance [name] is seen on rounds.  He reports no problems.  Been compliant with medication and denies any side effects.  Nurses report no issues.  He is pleasant and cooperative. 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   PE (pulmonary thromboembolism) ([place])    Schizo affective schizophrenia ([place])    Supratherapeutic INR 11/19/2016   History reviewed. No pertinent surgical history. Family History:  Family History  Problem Relation Age of Onset   CAD Mother    CAD Sister    Family Psychiatric  History: Unremarkable Social History:  Social History   Substance and Sexual Activity  Alcohol Use Not Currently   Comment: occassionally     Social History   Substance and Sexual Activity  Drug Use Yes   Types: Marijuana   Comment: Last use 11/29/16    Social History   Socioeconomic History   Marital status: Single    Spouse name: Not on file   Number of children: Not on file   Years of education: Not on file   Highest education level: Not on file  Occupational History   Not on file  Tobacco Use   Smoking status: Every Day    Packs/day: 1.00    Years: 23.00    Total pack years: 23.00    Types: Cigarettes   Smokeless tobacco: Never   Tobacco comments:    will provide material  Vaping Use   Vaping Use: Some  days  Substance and Sexual Activity   Alcohol use: Not Currently    Comment: occassionally   Drug use: Yes    Types: Marijuana    Comment: Last use 11/29/16   Sexual activity: Not Currently    Birth control/protection: None    Comment: occasional marijuana- none recently  Other Topics Concern   Not on file  Social History Narrative   From a group home in [place] Determinants of Health   Financial Resource Strain: Low Risk  (03/20/2021)   Overall Financial Resource Strain (CARDIA)    Difficulty of Paying Living Expenses: Not very hard  Food Insecurity: No Food Insecurity (08/19/2022)   Hunger Vital Sign    Worried About Running Out of Food in the Last Year: Never true    Ran Out of Food in the Last Year: Never true  Recent Concern: Food Insecurity - Food Insecurity Present (06/17/2022)   Hunger Vital Sign    Worried About Running Out of Food in the Last Year: Sometimes true    Ran Out of Food in the Last Year: Sometimes true  Transportation Needs: Unmet Transportation Needs (08/19/2022)   PRAPARE - Transportation    Lack of Transportation (Medical): Yes    Lack of Transportation (Non-Medical): Yes  Physical Activity: Insufficiently Active (03/20/2021)   Exercise Vital Sign    Days of Exercise per Week: 3 days  Minutes of Exercise per Session: 20 min  Stress: No Stress Concern Present (03/20/2021)   [place]    Feeling of Stress : Only a little  Social Connections: Socially Isolated (03/20/2021)   Social Connection and Isolation Panel [NHANES]    Frequency of Communication with Friends and Family: More than three times a week    Frequency of Social Gatherings with Friends and Family: More than three times a week    Attends Religious Services: Never    Marine scientist or Organizations: No    Attends Music therapist: Never    Marital Status: Never married   Additional Social  History:                         Sleep: Good  Appetite:  Good  Current Medications: Current Facility-Administered Medications  Medication Dose Route Frequency Provider Last Rate Last Admin   acetaminophen (TYLENOL) tablet 650 mg  650 mg Oral Q6H PRN Bennett, Christal H, NP       albuterol (VENTOLIN HFA) 108 (90 Base) MCG/ACT inhaler 2 puff  2 puff Inhalation Q6H PRN Clapacs, [name], MD       alum & mag hydroxide-simeth (MAALOX/MYLANTA) 200-200-20 MG/5ML suspension 30 mL  30 mL Oral Q4H PRN Bennett, Christal H, NP       apixaban (ELIQUIS) tablet 5 mg  5 mg Oral BID Clapacs, [name], MD   5 mg at 08/23/22 0902   clonazePAM (KLONOPIN) disintegrating tablet 0.25 mg  0.25 mg Oral BID PRN Clapacs, Madie Reno, MD       cloZAPine (CLOZARIL) tablet 250 mg  250 mg Oral QHS Clapacs, [name], MD   250 mg at 08/22/22 2200   feeding supplement (ENSURE ENLIVE / ENSURE PLUS) liquid 237 mL  237 mL Oral BID BM Clapacs, [name], MD   237 mL at 08/23/22 0907   insulin detemir (LEVEMIR) injection 10 Units  10 Units Subcutaneous BID Clapacs, Madie Reno, MD   10 Units at 08/23/22 0906   lisinopril (ZESTRIL) tablet 2.5 mg  2.5 mg Oral Daily Clapacs, [name], MD   2.5 mg at 08/23/22 [id number]   lithium carbonate (ESKALITH) ER tablet 450 mg  450 mg Oral QHS Clapacs, [name], MD   450 mg at 08/22/22 2100   magnesium hydroxide (MILK OF MAGNESIA) suspension 30 mL  30 mL Oral Daily PRN Bennett, Christal H, NP       metFORMIN (GLUCOPHAGE) tablet 1,000 mg  1,000 mg Oral BID WC Clapacs, [name], MD   1,000 mg at 08/23/22 0902   mometasone-formoterol (DULERA) 200-5 MCG/ACT inhaler 2 puff  2 puff Inhalation BID Clapacs, Madie Reno, MD   2 puff at 08/23/22 [id number]   nicotine polacrilex (NICORETTE) gum 2 mg  2 mg Oral PRN Clapacs, Madie Reno, MD   2 mg at 08/23/22 0907   OLANZapine (ZYPREXA) tablet 20 mg  20 mg Oral QHS Clapacs, [name], MD   20 mg at 08/22/22 2100   propranolol (INDERAL) tablet 20 mg  20 mg Oral QHS Clapacs, [name], MD   20 mg at  08/22/22 2100   sertraline (ZOLOFT) tablet 100 mg  100 mg Oral Daily Clapacs, [name], MD   100 mg at 08/23/22 0902   simvastatin (ZOCOR) tablet 40 mg  40 mg Oral q1800 Clapacs, Madie Reno, MD   40 mg at 08/22/22 1741  traZODone (DESYREL) tablet 100 mg  100 mg Oral QHS Clapacs, [name], MD   100 mg at 08/22/22 2100    Lab Results: No results found for this or any previous visit (from the past 48 hour(s)).  Blood Alcohol level:  Lab Results  Component Value Date   ETH <10 08/18/2022   ETH <10 [id number]    Metabolic Disorder Labs: Lab Results  Component Value Date   HGBA1C 4.9 04/26/2022   MPG 93.93 04/26/2022   MPG 108 11/12/2020   Lab Results  Component Value Date   PROLACTIN 2.0 (L) 08/31/2016   PROLACTIN 4.8 05/22/2016   Lab Results  Component Value Date   CHOL 163 04/28/2022   TRIG 378 (H) 04/28/2022   HDL 35 (L) 04/28/2022   CHOLHDL 4.7 04/28/2022   VLDL 76 (H) 04/28/2022   LDLCALC 52 04/28/2022   LDLCALC UNABLE TO CALCULATE IF TRIGLYCERIDE OVER 400 mg/dL 09/20/2020    Physical Findings: AIMS: Facial and Oral Movements Muscles of Facial Expression: None, normal Lips and Perioral Area: None, normal Jaw: None, normal Tongue: None, normal,Extremity Movements Upper (arms, wrists, hands, fingers): None, normal Lower (legs, knees, ankles, toes): None, normal, Trunk Movements Neck, shoulders, hips: None, normal, Overall Severity Severity of abnormal movements (highest score from questions above): None, normal Incapacitation due to abnormal movements: None, normal Patient's awareness of abnormal movements (rate only patient's report): No Awareness, Dental Status Current problems with teeth and/or dentures?: No Does patient usually wear dentures?: No  CIWA:    COWS:     Musculoskeletal: Strength & Muscle Tone: within normal limits Gait & Station: normal Patient leans: N/A  Psychiatric Specialty Exam:  Presentation  General Appearance:  Bizarre  Eye  Contact: Minimal  Speech: Pressured  Speech Volume: Increased  Handedness: Right   Mood and Affect  Mood: Anxious; Euphoric; Irritable  Affect: Congruent   Thought Process  Thought Processes: Disorganized  Descriptions of Associations:Loose  Orientation:Full (Time, Place and Person)  Thought Content:Illogical  History of Schizophrenia/Schizoaffective disorder:Yes  Duration of Psychotic Symptoms:Greater than six months  Hallucinations:No data recorded Ideas of Reference:Paranoia  Suicidal Thoughts:No data recorded Homicidal Thoughts:No data recorded  Sensorium  Memory: Immediate Fair; Recent Fair; Remote Fair  Judgment: Poor  Insight: Poor   Executive Functions  Concentration: Poor  Attention Span: Poor  Recall: Poor  Fund of Knowledge: Poor  Language: Poor   Psychomotor Activity  Psychomotor Activity:No data recorded  Assets  Assets: Communication Skills; Desire for Improvement; Housing; Physical Health; Resilience; Social Support   Sleep  Sleep:No data recorded    Blood pressure 120/65, pulse 66, temperature 97.9 F (36.6 C), temperature source Oral, resp. rate (!) 9, height 5' 10.5" (1.791 m), weight 82.6 kg, SpO2 100 %. Body mass index is 25.75 kg/m.   Treatment Plan Summary: Daily contact with patient to assess and evaluate symptoms and progress in treatment, Medication management, and Plan continue current medications.  Parks Ranger, DO 08/23/2022, 10:59 AM

## 2022-08-23 NOTE — [facility] Group Notes (Signed)
[facility] Group Notes:  (Nursing/MHT/Case Management/Adjunct)  Date:  08/23/2022  Time:  7:11 PM  Type of Therapy:  Psychoeducational Skills  Participation Level:  Active  Participation Quality:  Appropriate  Affect:  Appropriate  Cognitive:  Appropriate  Insight:  Appropriate  Engagement in Group:  Engaged  Modes of Intervention:  Activity  Summary of Progress/Problems:  Lance [name] 08/23/2022, 7:11 PM

## 2022-08-23 NOTE — Progress Notes (Signed)
Patient alert and oriented x 4, affect is flat thoughts are organized and coherent, he denies SI/HI/AVH interacting appropriately with peers and staff. 15 minutes safety checks maintained will continue to monitor

## 2022-08-23 NOTE — Progress Notes (Signed)
D- Patient alert and oriented x3-4 Affect flat/mood euthymic. Denies SI/ HI/AVH. He denies pain. He states that he "feels good and has no complaints today", "I look forward to going outside today". He attended Social Work Group and participated in outside activity. A- Scheduled medications administered to patient, per MD orders. No signs or symptoms of Hyper or Hypo glycemia. Support and encouragement provided.  Routine safety checks conducted every 15 minutes.  Patient informed to notify staff with problems or concerns and verbalizes understanding. R- No adverse drug reactions noted. Patient compliant with medications and treatment plan. Patient receptive, calm, and cooperative. Patient interacts well with others on the unit.  Patient contracts for safety and remains safe on the unit at this time.

## 2022-08-23 NOTE — Progress Notes (Signed)
Patient calm and pleasant during assessment denying SI/HI/AVH. Pt stated that he had a good day. Pt observed interacting appropriately with staff and peers on the unit by this writer. Pt compliant with medication administration per MD orders. Pt given education, support, and encouragement to be active in his treatment plan. Pt being monitored Q 15 minutes for safety per unit protocol, remains safe on the unit  

## 2022-08-23 NOTE — [facility] Group Notes (Signed)
LCSW Wellness Group Note   08/23/2022 12:45pm  Type of Group and Topic: Psychoeducational Group:  Wellness  Participation Level:  Active  Description of Group  Wellness group introduces the topic and its focus on developing healthy habits across the spectrum and its relationship to a decrease in hospital admissions.  Six areas of wellness are discussed: physical, social spiritual, intellectual, occupational, and emotional.  Patients are asked to consider their current wellness habits and to identify areas of wellness where they are interested and able to focus on improvements.    Therapeutic Goals Patients will understand components of wellness and how they can positively impact overall health.  Patients will identify areas of wellness where they have developed good habits. Patients will identify areas of wellness where they would like to make improvements.    Summary of Patient Progress:pt attentive in group, not a lot of contribution to discussion, did respond to CSW questions. Pt identified physical wellness as an area of strength and career as an area that needs work. Shared that he would like to get a part time job but not sure if he can since he stays at group home.      Therapeutic Modalities: Cognitive Behavioral Therapy Psychoeducation    Joanne Chars, LCSW

## 2022-08-23 NOTE — Plan of Care (Signed)
  Problem: Education: Goal: Knowledge of General Education information will improve Description: Including pain rating scale, medication(s)/side effects and non-pharmacologic comfort measures Outcome: Progressing   Problem: Health Behavior/Discharge Planning: Goal: Ability to manage health-related needs will improve Outcome: Progressing   Problem: Clinical Measurements: Goal: Ability to maintain clinical measurements within normal limits will improve Outcome: Progressing Goal: Will remain free from infection Outcome: Progressing Goal: Diagnostic test results will improve Outcome: Progressing Goal: Respiratory complications will improve Outcome: Progressing Goal: Cardiovascular complication will be avoided Outcome: Progressing   Problem: Activity: Goal: Risk for activity intolerance will decrease Outcome: Progressing   Problem: Nutrition: Goal: Adequate nutrition will be maintained Outcome: Progressing   Problem: Coping: Goal: Level of anxiety will decrease Outcome: Progressing   Problem: Elimination: Goal: Will not experience complications related to bowel motility Outcome: Progressing Goal: Will not experience complications related to urinary retention Outcome: Progressing   Problem: Pain Managment: Goal: General experience of comfort will improve Outcome: Progressing   Problem: Safety: Goal: Ability to remain free from injury will improve Outcome: Progressing   Problem: Skin Integrity: Goal: Risk for impaired skin integrity will decrease Outcome: Progressing   Problem: Education: Goal: Utilization of techniques to improve thought processes will improve Outcome: Progressing Goal: Knowledge of the prescribed therapeutic regimen will improve Outcome: Progressing   Problem: Activity: Goal: Interest or engagement in leisure activities will improve Outcome: Progressing Goal: Imbalance in normal sleep/wake cycle will improve Outcome: Progressing   Problem:  Coping: Goal: Coping ability will improve Outcome: Progressing Goal: Will verbalize feelings Outcome: Progressing   Problem: Health Behavior/Discharge Planning: Goal: Ability to make decisions will improve Outcome: Progressing Goal: Compliance with therapeutic regimen will improve Outcome: Progressing   Problem: Role Relationship: Goal: Will demonstrate positive changes in social behaviors and relationships Outcome: Progressing   Problem: Safety: Goal: Ability to disclose and discuss suicidal ideas will improve Outcome: Progressing Goal: Ability to identify and utilize support systems that promote safety will improve Outcome: Progressing   Problem: Self-Concept: Goal: Will verbalize positive feelings about self Outcome: Progressing Goal: Level of anxiety will decrease Outcome: Progressing   Problem: Education: Goal: Knowledge of [place] General Education information/materials will improve Outcome: Progressing Goal: Emotional status will improve Outcome: Progressing Goal: Mental status will improve Outcome: Progressing Goal: Verbalization of understanding the information provided will improve Outcome: Progressing   Problem: Activity: Goal: Interest or engagement in activities will improve Outcome: Progressing Goal: Sleeping patterns will improve Outcome: Progressing   Problem: Coping: Goal: Ability to verbalize frustrations and anger appropriately will improve Outcome: Progressing Goal: Ability to demonstrate self-control will improve Outcome: Progressing   Problem: Health Behavior/Discharge Planning: Goal: Identification of resources available to assist in meeting health care needs will improve Outcome: Progressing Goal: Compliance with treatment plan for underlying cause of condition will improve Outcome: Progressing   Problem: Physical Regulation: Goal: Ability to maintain clinical measurements within normal limits will improve Outcome: Progressing    Problem: Safety: Goal: Periods of time without injury will increase Outcome: Progressing   Problem: Education: Goal: Ability to state activities that reduce stress will improve Outcome: Progressing   Problem: Coping: Goal: Ability to identify and develop effective coping behavior will improve Outcome: Progressing   Problem: Self-Concept: Goal: Ability to identify factors that promote anxiety will improve Outcome: Progressing Goal: Level of anxiety will decrease Outcome: Progressing Goal: Ability to modify response to factors that promote anxiety will improve Outcome: Progressing

## 2022-08-24 NOTE — B[facility] Counselor (Signed)
CSW assisted patient in meeting with Gerald Stabs from the ACT team.    Gerald Stabs and patient reviewed housing needs.   Assunta Curtis, MSW, LCSW 08/24/2022 11:19 AM

## 2022-08-24 NOTE — Progress Notes (Signed)
Patient calm and pleasant during assessment denying SI/HI/AVH. Pt observed interacting appropriately with staff and peers on the unit by this Probation officer. Pt compliant with medication administration per MD orders. Pt given education, support, and encouragement to be active in his treatment plan. Pt being monitored Q 15 minutes for safety per unit protocol, remains safe on the unit

## 2022-08-24 NOTE — [facility] Group Notes (Signed)
[place] Group Notes:  (Nursing/MHT/Case Management/Adjunct)  Date:  08/24/2022  Time:  9:08 PM  Type of Therapy:   Wrap up  Participation Level:  Active  Participation Quality:  Appropriate  Affect:  Appropriate  Cognitive:  Alert  Insight:  Good  Engagement in Group:  Engaged and goal is to be discharge Wednesday.  Modes of Intervention:  Support  Summary of Progress/Problems:  Nehemiah Settle 08/24/2022, 9:08 PM

## 2022-08-24 NOTE — Progress Notes (Signed)
[facility] MD Progress Note  08/24/2022 2:13 PM Lance [name]  MRN:  [id number] Subjective: Follow-up patient with schizophrenia.  Patient denies any new symptoms.  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   PE (pulmonary thromboembolism) ([place])    Schizo affective schizophrenia ([place])    Supratherapeutic INR 11/19/2016   History reviewed. No pertinent surgical history. Family History:  Family History  Problem Relation Age of Onset   CAD Mother    CAD Sister    Family Psychiatric  History: See previous Social History:  Social History   Substance and Sexual Activity  Alcohol Use Not Currently   Comment: occassionally     Social History   Substance and Sexual Activity  Drug Use Yes   Types: Marijuana   Comment: Last use 11/29/16    Social History   Socioeconomic History   Marital status: Single    Spouse name: Not on file   Number of children: Not on file   Years of education: Not on file   Highest education level: Not on file  Occupational History   Not on file  Tobacco Use   Smoking status: Every Day    Packs/day: 1.00    Years: 23.00    Total pack years: 23.00    Types: Cigarettes   Smokeless tobacco: Never   Tobacco  comments:    will provide material  Vaping Use   Vaping Use: Some days  Substance and Sexual Activity   Alcohol use: Not Currently    Comment: occassionally   Drug use: Yes    Types: Marijuana    Comment: Last use 11/29/16   Sexual activity: Not Currently    Birth control/protection: None    Comment: occasional marijuana- none recently  Other Topics Concern   Not on file  Social History Narrative   From a group home in [place] Determinants of Health   Financial Resource Strain: Low Risk  (03/20/2021)   Overall Financial Resource Strain (CARDIA)    Difficulty of Paying Living Expenses: Not very hard  Food Insecurity: No Food Insecurity (08/19/2022)   Hunger Vital Sign    Worried About Running Out of Food in the Last Year: Never true    Ran Out of Food in the Last Year: Never true  Recent Concern: Food Insecurity - Food Insecurity Present (06/17/2022)   Hunger Vital Sign    Worried About Running Out of Food in the Last Year: Sometimes true    Ran Out of Food in the Last Year: Sometimes true  Transportation Needs: Unmet Transportation Needs (08/19/2022)   PRAPARE - Transportation    Lack of Transportation (Medical): Yes    Lack of Transportation (Non-Medical): Yes  Physical Activity: Insufficiently Active (03/20/2021)   Exercise Vital Sign  Days of Exercise per Week: 3 days    Minutes of Exercise per Session: 20 min  Stress: No Stress Concern Present (03/20/2021)   [place]    Feeling of Stress : Only a little  Social Connections: Socially Isolated (03/20/2021)   Social Connection and Isolation Panel [NHANES]    Frequency of Communication with Friends and Family: More than three times a week    Frequency of Social Gatherings with Friends and Family: More than three times a week    Attends Religious Services: Never    Marine scientist or Organizations: No    Attends Music therapist:  Never    Marital Status: Never married   Additional Social History:                         Sleep: Fair  Appetite:  Fair  Current Medications: Current Facility-Administered Medications  Medication Dose Route Frequency Provider Last Rate Last Admin   acetaminophen (TYLENOL) tablet 650 mg  650 mg Oral Q6H PRN Bennett, Christal H, NP       albuterol (VENTOLIN HFA) 108 (90 Base) MCG/ACT inhaler 2 puff  2 puff Inhalation Q6H PRN Caira Poche T, MD       alum & mag hydroxide-simeth (MAALOX/MYLANTA) 200-200-20 MG/5ML suspension 30 mL  30 mL Oral Q4H PRN Bennett, Christal H, NP       apixaban (ELIQUIS) tablet 5 mg  5 mg Oral BID Anayiah Howden T, MD   5 mg at 08/24/22 [id number]   clonazePAM (KLONOPIN) disintegrating tablet 0.25 mg  0.25 mg Oral BID PRN Kyndall Chaplin, Madie Reno, MD       cloZAPine (CLOZARIL) tablet 250 mg  250 mg Oral QHS Nagee Goates T, MD   250 mg at 08/23/22 2053   feeding supplement (ENSURE ENLIVE / ENSURE PLUS) liquid 237 mL  237 mL Oral BID BM Halei Hanover T, MD   237 mL at 08/24/22 0907   insulin detemir (LEVEMIR) injection 10 Units  10 Units Subcutaneous BID Barnaby Rippeon, Madie Reno, MD   10 Units at 08/24/22 0906   lisinopril (ZESTRIL) tablet 2.5 mg  2.5 mg Oral Daily Jacelynn Hayton T, MD   2.5 mg at 08/24/22 0906   lithium carbonate (ESKALITH) ER tablet 450 mg  450 mg Oral QHS Nekeshia Lenhardt T, MD   450 mg at 08/23/22 2053   magnesium hydroxide (MILK OF MAGNESIA) suspension 30 mL  30 mL Oral Daily PRN Bennett, Christal H, NP       metFORMIN (GLUCOPHAGE) tablet 1,000 mg  1,000 mg Oral BID WC Antonis Lor T, MD   1,000 mg at 08/24/22 0906   mometasone-formoterol (DULERA) 200-5 MCG/ACT inhaler 2 puff  2 puff Inhalation BID Tykee Heideman, Madie Reno, MD   2 puff at 08/23/22 2053   nicotine polacrilex (NICORETTE) gum 2 mg  2 mg Oral PRN Shloma Roggenkamp, Madie Reno, MD   2 mg at 08/23/22 1738   OLANZapine (ZYPREXA) tablet 20 mg  20 mg Oral QHS Isabel Freese T, MD   20 mg at 08/23/22 2053   propranolol  (INDERAL) tablet 20 mg  20 mg Oral QHS Johnedward Brodrick T, MD   20 mg at 08/23/22 2053   sertraline (ZOLOFT) tablet 100 mg  100 mg Oral Daily Quinlan Vollmer T, MD   100 mg at 08/24/22 0906   simvastatin (ZOCOR) tablet 40 mg  40 mg Oral q1800 Sibyl Mikula  T, MD   40 mg at 08/23/22 1737   traZODone (DESYREL) tablet 100 mg  100 mg Oral QHS Avalene Sealy, Madie Reno, MD   100 mg at 08/23/22 2053    Lab Results: No results found for this or any previous visit (from the past 48 hour(s)).  Blood Alcohol level:  Lab Results  Component Value Date   ETH <10 08/18/2022   ETH <10 123456    Metabolic Disorder Labs: Lab Results  Component Value Date   HGBA1C 4.9 04/26/2022   MPG 93.93 04/26/2022   MPG 108 11/12/2020   Lab Results  Component Value Date   PROLACTIN 2.0 (L) 08/31/2016   PROLACTIN 4.8 05/22/2016   Lab Results  Component Value Date   CHOL 163 04/28/2022   TRIG 378 (H) 04/28/2022   HDL 35 (L) 04/28/2022   CHOLHDL 4.7 04/28/2022   VLDL 76 (H) 04/28/2022   LDLCALC 52 04/28/2022   LDLCALC UNABLE TO CALCULATE IF TRIGLYCERIDE OVER 400 mg/dL 09/20/2020    Physical Findings: AIMS: Facial and Oral Movements Muscles of Facial Expression: None, normal Lips and Perioral Area: None, normal Jaw: None, normal Tongue: None, normal,Extremity Movements Upper (arms, wrists, hands, fingers): None, normal Lower (legs, knees, ankles, toes): None, normal, Trunk Movements Neck, shoulders, hips: None, normal, Overall Severity Severity of abnormal movements (highest score from questions above): None, normal Incapacitation due to abnormal movements: None, normal Patient's awareness of abnormal movements (rate only patient's report): No Awareness, Dental Status Current problems with teeth and/or dentures?: No Does patient usually wear dentures?: No  CIWA:    COWS:     Musculoskeletal: Strength & Muscle Tone: within normal limits Gait & Station: normal Patient leans: N/A  Psychiatric Specialty  Exam:  Presentation  General Appearance:  Bizarre  Eye Contact: Minimal  Speech: Pressured  Speech Volume: Increased  Handedness: Right   Mood and Affect  Mood: Anxious; Euphoric; Irritable  Affect: Congruent   Thought Process  Thought Processes: Disorganized  Descriptions of Associations:Loose  Orientation:Full (Time, Place and Person)  Thought Content:Illogical  History of Schizophrenia/Schizoaffective disorder:Yes  Duration of Psychotic Symptoms:Greater than six months  Hallucinations:No data recorded Ideas of Reference:Paranoia  Suicidal Thoughts:No data recorded Homicidal Thoughts:No data recorded  Sensorium  Memory: Immediate Fair; Recent Fair; Remote Fair  Judgment: Poor  Insight: Poor   Executive Functions  Concentration: Poor  Attention Span: Poor  Recall: Poor  Fund of Knowledge: Poor  Language: Poor   Psychomotor Activity  Psychomotor Activity:No data recorded  Assets  Assets: Communication Skills; Desire for Improvement; Housing; Physical Health; Resilience; Social Support   Sleep  Sleep:No data recorded   Physical Exam: Physical Exam Vitals and nursing note reviewed.  Constitutional:      Appearance: Normal appearance.  HENT:     Head: Normocephalic and atraumatic.     Mouth/Throat:     Pharynx: Oropharynx is clear.  Eyes:     Pupils: Pupils are equal, round, and reactive to light.  Cardiovascular:     Rate and Rhythm: Normal rate and regular rhythm.  Pulmonary:     Effort: Pulmonary effort is normal.     Breath sounds: Normal breath sounds.  Abdominal:     General: Abdomen is flat.     Palpations: Abdomen is soft.  Musculoskeletal:        General: Normal range of motion.  Skin:    General: Skin is warm and dry.  Neurological:     General: No focal deficit present.  Mental Status: He is alert. Mental status is at baseline.  Psychiatric:        Attention and Perception: Attention normal.         Mood and Affect: Mood normal. Affect is blunt.        Speech: Speech is delayed.        Behavior: Behavior is slowed.        Thought Content: Thought content is delusional.        Cognition and Memory: Cognition normal.    Review of Systems  Constitutional: Negative.   HENT: Negative.    Eyes: Negative.   Respiratory: Negative.    Cardiovascular: Negative.   Gastrointestinal: Negative.   Musculoskeletal: Negative.   Skin: Negative.   Neurological: Negative.   Psychiatric/Behavioral: Negative.     Blood pressure 117/65, pulse (!) 58, temperature 97.7 F (36.5 C), temperature source Oral, resp. rate 19, height 5' 10.5" (1.791 m), weight 82.6 kg, SpO2 100 %. Body mass index is 25.75 kg/m.   Treatment Plan Summary: Medication management and Plan no change to medication today.  He has tolerated the increased dose of clozapine.  Advised patient that we were likely to discharge him tomorrow.  He spoke with people from his ACT team and understands that they are working on a new group home but it is not yet ready and probably will not be any time in the immediate future.  Alethia Berthold, MD 08/24/2022, 2:13 PM

## 2022-08-24 NOTE — Group Note (Signed)
Recreation Therapy Group Note   Group Topic:Goal Setting  Group Date: 08/24/2022 Start Time: 1000 End Time: 1055 Facilitators: Vilma Prader, LRT, CTRS Location:  Craft Room  Group Description: Scientist, physiological. Patients were given many different magazines, a glue stick, markers, and a piece of cardstock paper. LRT and pts discussed the importance of having goals in life. LRT and pts discussed the difference between short-term and long-term goals. LRT encouraged pts to create a vision board, with images they picked and then cut out by LRT from the magazine, for themselves that capture their short and long-term goals they have for themselves. On the back of the paper, pt encouraged to write 3 different coping skills that can help them reach those goals. LRT encouraged pts to show and explain their vision board to the group once complete. LRT offered to laminate vision board once dry and complete.   Affect/Mood: N/A   Participation Level: Did not attend    Clinical Observations/Individualized Feedback: Lance [name] did not attend group due to resting in his room.  Plan: Continue to engage patient in RT group sessions 2-3x/week.   Vilma Prader, LRT, CTRS 08/24/2022 11:07 AM

## 2022-08-24 NOTE — BHH Group Notes (Signed)
[place] Group Notes:  (Nursing/MHT/Case Management/Adjunct)  Date:  08/24/2022  Time:  3:18 PM  Type of Therapy:  Psychoeducational Skills  Participation Level:  Active  Participation Quality:  Appropriate  Affect:  Appropriate  Cognitive:  Appropriate  Insight:  Appropriate  Engagement in Group:  Engaged  Modes of Intervention:  Activity  Summary of Progress/Problems:  Adela Lank [facility] 08/24/2022, 3:18 PM

## 2022-08-24 NOTE — Plan of Care (Signed)
D- Patient alert and oriented. Patient presented in a pleasant mood on assessment stating that he slept "alright" last night and had no complaints to voice to this Probation officer. Patient denied anxiety, but endorsed depression. When this writer asked patient why he was feeling this way, he stated "I don't know, I just feel that way and I don't know why". Patient also denied SI, HI, AVH, and pain. Patient stated that overall, he is feeling "so-so". Patient had no stated goals for today.  A- Scheduled medications administered to patient, per MD orders. Support and encouragement provided.  Routine safety checks conducted every 15 minutes.  Patient informed to notify staff with problems or concerns.  R- No adverse drug reactions noted. Patient contracts for safety at this time. Patient compliant with medications and treatment plan. Patient receptive, calm, and cooperative. Patient interacts well with others on the unit. Patient remains safe at this time.  Problem: Education: Goal: Knowledge of General Education information will improve Description: Including pain rating scale, medication(s)/side effects and non-pharmacologic comfort measures Outcome: Progressing   Problem: Health Behavior/Discharge Planning: Goal: Ability to manage health-related needs will improve Outcome: Progressing   Problem: Clinical Measurements: Goal: Ability to maintain clinical measurements within normal limits will improve Outcome: Progressing Goal: Will remain free from infection Outcome: Progressing Goal: Diagnostic test results will improve Outcome: Progressing Goal: Respiratory complications will improve Outcome: Progressing Goal: Cardiovascular complication will be avoided Outcome: Progressing   Problem: Activity: Goal: Risk for activity intolerance will decrease Outcome: Progressing   Problem: Nutrition: Goal: Adequate nutrition will be maintained Outcome: Progressing   Problem: Coping: Goal: Level of anxiety  will decrease Outcome: Progressing   Problem: Elimination: Goal: Will not experience complications related to bowel motility Outcome: Progressing Goal: Will not experience complications related to urinary retention Outcome: Progressing   Problem: Pain Managment: Goal: General experience of comfort will improve Outcome: Progressing   Problem: Safety: Goal: Ability to remain free from injury will improve Outcome: Progressing   Problem: Skin Integrity: Goal: Risk for impaired skin integrity will decrease Outcome: Progressing   Problem: Education: Goal: Utilization of techniques to improve thought processes will improve Outcome: Progressing Goal: Knowledge of the prescribed therapeutic regimen will improve Outcome: Progressing   Problem: Activity: Goal: Interest or engagement in leisure activities will improve Outcome: Progressing Goal: Imbalance in normal sleep/wake cycle will improve Outcome: Progressing   Problem: Coping: Goal: Coping ability will improve Outcome: Progressing Goal: Will verbalize feelings Outcome: Progressing   Problem: Health Behavior/Discharge Planning: Goal: Ability to make decisions will improve Outcome: Progressing Goal: Compliance with therapeutic regimen will improve Outcome: Progressing   Problem: Role Relationship: Goal: Will demonstrate positive changes in social behaviors and relationships Outcome: Progressing   Problem: Safety: Goal: Ability to disclose and discuss suicidal ideas will improve Outcome: Progressing Goal: Ability to identify and utilize support systems that promote safety will improve Outcome: Progressing   Problem: Self-Concept: Goal: Will verbalize positive feelings about self Outcome: Progressing Goal: Level of anxiety will decrease Outcome: Progressing   Problem: Education: Goal: Knowledge of [place] [facility] Education information/materials will improve Outcome: Progressing Goal: Emotional status will  improve Outcome: Progressing Goal: Mental status will improve Outcome: Progressing Goal: Verbalization of understanding the information provided will improve Outcome: Progressing   Problem: Activity: Goal: Interest or engagement in activities will improve Outcome: Progressing Goal: Sleeping patterns will improve Outcome: Progressing   Problem: Coping: Goal: Ability to verbalize frustrations and anger appropriately will improve Outcome: Progressing Goal: Ability to demonstrate self-control will  improve Outcome: Progressing   Problem: Health Behavior/Discharge Planning: Goal: Identification of resources available to assist in meeting health care needs will improve Outcome: Progressing Goal: Compliance with treatment plan for underlying cause of condition will improve Outcome: Progressing   Problem: Physical Regulation: Goal: Ability to maintain clinical measurements within normal limits will improve Outcome: Progressing   Problem: Safety: Goal: Periods of time without injury will increase Outcome: Progressing   Problem: Education: Goal: Ability to state activities that reduce stress will improve Outcome: Progressing   Problem: Coping: Goal: Ability to identify and develop effective coping behavior will improve Outcome: Progressing   Problem: Self-Concept: Goal: Ability to identify factors that promote anxiety will improve Outcome: Progressing Goal: Level of anxiety will decrease Outcome: Progressing Goal: Ability to modify response to factors that promote anxiety will improve Outcome: Progressing

## 2022-08-24 NOTE — Group Note (Signed)
[facility] LCSW Group Therapy Note    Group Date: 08/24/2022 Start Time: 1300 End Time: 1400  Type of Therapy and Topic:  Group Therapy:  Overcoming Obstacles  Participation Level:  [facility] PARTICIPATION LEVEL: Did Not Attend   Description of Group:   In this group patients will be encouraged to explore what they see as obstacles to their own wellness and recovery. They will be guided to discuss their thoughts, feelings, and behaviors related to these obstacles. The group will process together ways to cope with barriers, with attention given to specific choices patients can make. Each patient will be challenged to identify changes they are motivated to make in order to overcome their obstacles. This group will be process-oriented, with patients participating in exploration of their own experiences as well as giving and receiving support and challenge from other group members.  Therapeutic Goals: 1. Patient will identify personal and current obstacles as they relate to admission. 2. Patient will identify barriers that currently interfere with their wellness or overcoming obstacles.  3. Patient will identify feelings, thought process and behaviors related to these barriers. 4. Patient will identify two changes they are willing to make to overcome these obstacles:    Summary of Patient Progress X   Therapeutic Modalities:   Cognitive Behavioral Therapy Solution Focused Therapy Motivational Interviewing Relapse Prevention Therapy   Shirl Harris, LCSW

## 2022-08-25 MED ORDER — TRAZODONE HCL 100 MG PO TABS: 100.0000 mg | ORAL_TABLET | Freq: Every day | ORAL | 1 refills | Status: DC

## 2022-08-25 MED ORDER — SERTRALINE HCL 100 MG PO TABS: 100.0000 mg | ORAL_TABLET | Freq: Every day | ORAL | 1 refills | Status: DC

## 2022-08-25 MED ORDER — ALBUTEROL SULFATE HFA 108 (90 BASE) MCG/ACT IN AERS: 2.0000 | INHALATION_SPRAY | Freq: Four times a day (QID) | RESPIRATORY_TRACT | 1 refills | Status: DC | PRN

## 2022-08-25 MED ORDER — OLANZAPINE 20 MG PO TABS: 20.0000 mg | ORAL_TABLET | Freq: Every day | ORAL | 1 refills | Status: DC

## 2022-08-25 MED ORDER — METFORMIN HCL 1000 MG PO TABS: 1000.0000 mg | ORAL_TABLET | Freq: Two times a day (BID) | ORAL | 1 refills | Status: DC

## 2022-08-25 MED ORDER — PROPRANOLOL HCL 20 MG PO TABS: 20.0000 mg | ORAL_TABLET | Freq: Every day | ORAL | 1 refills | Status: DC

## 2022-08-25 MED ORDER — APIXABAN 5 MG PO TABS: 5.0000 mg | ORAL_TABLET | Freq: Two times a day (BID) | ORAL | 1 refills | Status: DC

## 2022-08-25 MED ORDER — INSULIN DETEMIR 100 UNIT/ML ~~LOC~~ SOLN: 10.0000 [IU] | Freq: Two times a day (BID) | SUBCUTANEOUS | 1 refills | Status: DC

## 2022-08-25 MED ORDER — CLONAZEPAM 0.25 MG PO TBDP: 0.2500 mg | ORAL_TABLET | Freq: Two times a day (BID) | ORAL | 1 refills | Status: DC | PRN

## 2022-08-25 MED ORDER — NICOTINE POLACRILEX 2 MG MT GUM: 2.0000 mg | CHEWING_GUM | OROMUCOSAL | 1 refills | Status: DC | PRN

## 2022-08-25 MED ORDER — LITHIUM CARBONATE ER 450 MG PO TBCR: 450.0000 mg | EXTENDED_RELEASE_TABLET | Freq: Every day | ORAL | 1 refills | Status: DC

## 2022-08-25 MED ORDER — SIMVASTATIN 40 MG PO TABS: 40.0000 mg | ORAL_TABLET | Freq: Every day | ORAL | 1 refills | Status: AC

## 2022-08-25 MED ORDER — LISINOPRIL 2.5 MG PO TABS: 2.5000 mg | ORAL_TABLET | Freq: Every day | ORAL | 1 refills | Status: AC

## 2022-08-25 MED ORDER — CLOZAPINE 50 MG PO TABS: 250.0000 mg | ORAL_TABLET | Freq: Every day | ORAL | 0 refills | Status: DC

## 2022-08-25 NOTE — NC FL2 (Signed)
[place] LEVEL OF CARE FORM     IDENTIFICATION  Patient Name: Lance [name] Birthdate: 1976/02/22 Sex: male Admission Date (Current Location): 08/19/2022  [facility] [place] and [place] Number:  Engineering geologist and Address:  [facility], [address] [place], [place] [address]      Provider Number: [id number]  Attending Physician Name and Address:  Clapacs, Madie Reno, MD  Relative Name and Phone Number:       Current Level of Care: Hospital Recommended Level of Care: [place], Other (Comment), [place] (Group Home) Prior Approval Number:    Date Approved/Denied:   PASRR Number:    Discharge Plan: Other (Comment) (Group Home, [place])    Current Diagnoses: Patient Active Problem List   Diagnosis Date Noted   Schizophrenia, 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[place]) 08/19/2022   Odontogenic infection of jaw 06/09/2022   Cellulitis of face 06/06/2022   AKI (acute kidney injury) ([place]) 06/06/2022   Boxer's metacarpal fracture, neck, closed 04/27/2022   Psychosis ([place]) 04/27/2022   Lupus anticoagulant syndrome ([place]) 03/05/2022   Insomnia    Hallucinations    Alcohol abuse 04/12/2018   Acute psychosis ([place]) 10/15/2017   Undifferentiated schizophrenia ([place]) 09/20/2017   Tobacco use disorder 09/01/2016   Schizoaffective disorder, bipolar type ([place]) 05/22/2016   Diabetes mellitus without complication ([place]) [id number]   Hyperlipidemia 05/21/2016   Chronic anticoagulation 05/21/2016   Hypertension 09/25/2015    Orientation RESPIRATION BLADDER Height & Weight     Self, Time, Place, Situation  Normal Continent Weight: 182 lb (82.6 kg) Height:  5' 10.5" (179.1 cm)  BEHAVIORAL SYMPTOMS/MOOD NEUROLOGICAL BOWEL NUTRITION STATUS  [place]  (NA) Continent Diet  AMBULATORY STATUS COMMUNICATION OF NEEDS Skin   Independent Verbally Normal                       Personal Care Assistance Level of Assistance   (NA)            Functional Limitations Info  Sight Sight Info: Impaired        SPECIAL CARE FACTORS FREQUENCY                       Contractures Contractures Info: Not present    Additional Factors Info  Code Status, Allergies, Psychotropic Code Status Info: Full Allergies Info: Penicillins Psychotropic Info: Clozaril, Zyprexa, Zoloft, Trazodone         Current Medications (08/25/2022):  This is the current hospital active medication list Current Facility-Administered Medications  Medication Dose Route Frequency Provider Last Rate Last Admin   acetaminophen (TYLENOL) tablet 650 mg  650 mg Oral Q6H PRN Bennett, Christal H, NP       albuterol (VENTOLIN HFA) 108 (90 Base) MCG/ACT inhaler 2 puff  2 puff Inhalation Q6H PRN Clapacs, [name], MD       alum & mag hydroxide-simeth (MAALOX/MYLANTA) 200-200-20 MG/5ML suspension 30 mL  30 mL Oral Q4H PRN Bennett, Christal H, NP       apixaban (ELIQUIS) tablet 5 mg  5 mg Oral BID Clapacs, [name], MD   5 mg at 08/25/22 0948   clonazePAM (KLONOPIN) disintegrating tablet 0.25 mg  0.25 mg Oral BID PRN Clapacs, Madie Reno, MD       cloZAPine (CLOZARIL) tablet 250 mg  250 mg Oral QHS Clapacs, [name], MD   250 mg at 08/24/22 2042   feeding supplement (ENSURE ENLIVE / ENSURE PLUS) liquid  237 mL  237 mL Oral BID BM Clapacs, [name], MD   237 mL at 08/25/22 0949   insulin detemir (LEVEMIR) injection 10 Units  10 Units Subcutaneous BID Clapacs, Madie Reno, MD   10 Units at 08/25/22 0948   lisinopril (ZESTRIL) tablet 2.5 mg  2.5 mg Oral Daily Clapacs, Madie Reno, MD   2.5 mg at 08/25/22 0948   lithium carbonate (ESKALITH) ER tablet 450 mg  450 mg Oral QHS Clapacs, [name], MD   450 mg at 08/24/22 2042   magnesium hydroxide (MILK OF MAGNESIA) suspension 30 mL  30 mL Oral Daily PRN Bennett, Christal H, NP       metFORMIN (GLUCOPHAGE) tablet 1,000 mg  1,000 mg Oral BID WC Clapacs, [name], MD   1,000 mg at 08/25/22 0948   mometasone-formoterol (DULERA) 200-5 MCG/ACT  inhaler 2 puff  2 puff Inhalation BID Clapacs, Madie Reno, MD   2 puff at 08/24/22 2042   nicotine polacrilex (NICORETTE) gum 2 mg  2 mg Oral PRN Clapacs, Madie Reno, MD   2 mg at 08/25/22 0948   OLANZapine (ZYPREXA) tablet 20 mg  20 mg Oral QHS Clapacs, [name], MD   20 mg at 08/24/22 2042   propranolol (INDERAL) tablet 20 mg  20 mg Oral QHS Clapacs, [name], MD   20 mg at 08/24/22 2042   sertraline (ZOLOFT) tablet 100 mg  100 mg Oral Daily Clapacs, [name], MD   100 mg at 08/25/22 0948   simvastatin (ZOCOR) tablet 40 mg  40 mg Oral q1800 Clapacs, [name], MD   40 mg at 08/24/22 1729   traZODone (DESYREL) tablet 100 mg  100 mg Oral QHS Clapacs, Madie Reno, MD   100 mg at 08/24/22 2042     Discharge Medications: Please see discharge summary for a list of discharge medications.  Relevant Imaging Results:  Relevant Lab Results:   Additional Information Timoteo Ace, guardian, [phone]  Rozann Lesches, LCSW

## 2022-08-25 NOTE — Progress Notes (Signed)
Patient ID: Lance [name], male   DOB: [birth year], 47 y.o.   MRN: [id number]  Discharge Note:  Patient denies SI/HI/AVH at this time. Discharge instructions, AVS, prescriptions, and transition record gone over with patient. Patient refused to fill out the Suicidal Safety Plan, stating "I'm not suicidal". Patient agrees to comply with medication management, follow-up visit, and outpatient therapy. Patient belongings returned to patient. Patient questions and concerns addressed and answered. Patient ambulatory off unit. Patient discharged back to [facility] with with a staff member.

## 2022-08-25 NOTE — Progress Notes (Signed)
D- Patient alert and oriented. Patient presented in a sullen, but pleasant mood on assessment stating that he slept "ok" last night and had no complaints to voice to this Probation officer. Patient denied anxiety, but endorsed depression, stating "I feel a little depressed, but it's not gonna go away". Patient also denied SI, HI, AVH, and pain at this time. Patient had no stated goals for today.  A- Scheduled medications administered to patient, per MD orders. Support and encouragement provided.  Routine safety checks conducted every 15 minutes. Patient informed to notify staff with problems or concerns.  R- No adverse drug reactions noted. Patient contracts for safety at this time. Patient compliant with medications and treatment plan. Patient receptive, calm, and cooperative. Patient interacts well with others on the unit. Patient remains safe at this time.

## 2022-08-25 NOTE — Progress Notes (Signed)
  [facility] Adult Case Management Discharge Plan :  Will you be returning to the same living situation after discharge:  Yes,  pt reports that he is returning home. At discharge, do you have transportation home?: Yes,  Group home will provide transportation. Do you have the ability to pay for your medications: Yes,  Medicare Part A and B  Release of information consent forms completed and in the chart;  Patient's signature needed at discharge.  Patient to Follow up at:  Follow-up [facility]. Follow up.   Why: Follow up appointment with Dr. Loni Muse is scheduled for 08/26/2022. Contact information: [place] [address] [phone]                 Next level of care provider has access to [facility] and Suicide Prevention discussed: Yes,  SPE completed with the patient.      Has patient been referred to the Quitline?: Patient refused referral  Patient has been referred for addiction treatment: Pt. refused referral  Rozann Lesches, LCSW 08/25/2022, 12:02 PM

## 2022-08-25 NOTE — Group Note (Signed)
[facility] LCSW Group Therapy Note   Group Date: 08/25/2022 Start Time: 1300 End Time: 1400   Type of Therapy/Topic:  Group Therapy:  Emotion Regulation  Participation Level:  Did Not Attend   Mood:  Description of Group:    The purpose of this group is to assist patients in learning to regulate negative emotions and experience positive emotions. Patients will be guided to discuss ways in which they have been vulnerable to their negative emotions. These vulnerabilities will be juxtaposed with experiences of positive emotions or situations, and patients challenged to use positive emotions to combat negative ones. Special emphasis will be placed on coping with negative emotions in conflict situations, and patients will process healthy conflict resolution skills.  Therapeutic Goals: Patient will identify two positive emotions or experiences to reflect on in order to balance out negative emotions:  Patient will label two or more emotions that they find the most difficult to experience:  Patient will be able to demonstrate positive conflict resolution skills through discussion or role plays:   Summary of Patient Progress:  Patient did not attend group despite encouraged participation.     Therapeutic Modalities:   Cognitive Behavioral Therapy Feelings Identification Dialectical Behavioral Therapy   Durenda Hurt, [place]

## 2022-08-25 NOTE — [facility] Suicide Risk Assessment (Signed)
[facility] Discharge Suicide Risk Assessment   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[place]) Discharge Diagnoses: Principal Problem:   Schizophrenia, 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[place]) Active Problems:   Hypertension   Diabetes mellitus without complication ([place])   Hyperlipidemia   Chronic anticoagulation   Tobacco use disorder   Insomnia   Total Time spent with patient: 30 minutes  Musculoskeletal: Strength & Muscle Tone: within normal limits Gait & Station: normal Patient leans: N/A  Psychiatric Specialty Exam  Presentation  General Appearance:  Bizarre  Eye Contact: Minimal  Speech: Pressured  Speech Volume: Increased  Handedness: Right   Mood and Affect  Mood: Anxious; Euphoric; Irritable  Duration of Depression Symptoms: Greater than two weeks  Affect: Congruent   Thought Process  Thought Processes: Disorganized  Descriptions of Associations:Loose  Orientation:Full (Time, Place and Person)  Thought Content:Illogical  History of Schizophrenia/Schizoaffective disorder:Yes  Duration of Psychotic Symptoms:Greater than six months  Hallucinations:No data recorded Ideas of Reference:Paranoia  Suicidal Thoughts:No data recorded Homicidal Thoughts:No data recorded  Sensorium  Memory: Immediate Fair; Recent Fair; Remote Fair  Judgment: Poor  Insight: Poor   Executive Functions  Concentration: Poor  Attention Span: Poor  Recall: Poor  Fund of Knowledge: Poor  Language: Poor   Psychomotor Activity  Psychomotor Activity:No data recorded  Assets  Assets: Communication Skills; Desire for Improvement; Housing; Physical Health; Resilience; Social Support   Sleep  Sleep:No data recorded  Physical Exam: Physical Exam Vitals and nursing note reviewed.  Constitutional:      Appearance: Normal appearance.  HENT:     Head: Normocephalic and atraumatic.     Mouth/Throat:     Pharynx: Oropharynx is clear.  Eyes:      Pupils: Pupils are equal, round, and reactive to light.  Cardiovascular:     Rate and Rhythm: Normal rate and regular rhythm.  Pulmonary:     Effort: Pulmonary effort is normal.     Breath sounds: Normal breath sounds.  Abdominal:     General: Abdomen is flat.     Palpations: Abdomen is soft.  Musculoskeletal:        General: Normal range of motion.  Skin:    General: Skin is warm and dry.  Neurological:     General: No focal deficit present.     Mental Status: He is alert. Mental status is at baseline.  Psychiatric:        Attention and Perception: Attention normal.        Mood and Affect: Mood normal.        Speech: Speech normal.        Behavior: Behavior normal.        Thought Content: Thought content normal.        Cognition and Memory: Cognition normal.        Judgment: Judgment normal.    Review of Systems  Constitutional: Negative.   HENT: Negative.    Eyes: Negative.   Respiratory: Negative.    Cardiovascular: Negative.   Gastrointestinal: Negative.   Musculoskeletal: Negative.   Skin: Negative.   Neurological: Negative.   Psychiatric/Behavioral: Negative.     Blood pressure 107/73, pulse 64, temperature 98 F (36.7 C), temperature source Oral, resp. rate 18, height 5' 10.5" (1.791 m), weight 82.6 kg, SpO2 99 %. Body mass index is 25.75 kg/m.  Mental Status Per Nursing Assessment::   On Admission:  Suicidal ideation indicated by patient  Demographic Factors:  Male  Loss Factors: Financial problems/change in socioeconomic status  Historical Factors: Impulsivity  Risk Reduction Factors:   Living with another person, especially a relative, Positive social support, Positive therapeutic relationship, and Positive coping skills or problem solving skills  Continued Clinical Symptoms:  Schizophrenia:   Paranoid or undifferentiated type  Cognitive Features That Contribute To Risk:  None    Suicide Risk:  Minimal: No identifiable suicidal ideation.   Patients presenting with no risk factors but with morbid ruminations; may be classified as minimal risk based on the severity of the depressive symptoms    Plan Of Care/Follow-up recommendations:  Other:  Patient denies suicidal ideation.  He has not shown any dangerous behavior in the hospital.  He understands the plan to return to his group home and has no complaints and no expression of violent or homicidal ideation.  Agrees to continue outpatient treatment with his ACT team and follow-up and continue current medicine.  Alethia Berthold, MD 08/25/2022, 10:13 AM

## 2022-08-25 NOTE — Care Management Important Message (Signed)
Important Message  Patient Details  Name: Lance [name] MRN: [id number] Date of Birth: May 12, 1976   Medicare Important Message Given:  Yes     Rozann Lesches, LCSW 08/25/2022, 12:00 PM

## 2022-08-25 NOTE — Plan of Care (Signed)
Problem: Education: Goal: Knowledge of General Education information will improve Description: Including pain rating scale, medication(s)/side effects and non-pharmacologic comfort measures Outcome: Adequate for Discharge   Problem: Health Behavior/Discharge Planning: Goal: Ability to manage health-related needs will improve Outcome: Adequate for Discharge   Problem: Clinical Measurements: Goal: Ability to maintain clinical measurements within normal limits will improve Outcome: Adequate for Discharge Goal: Will remain free from infection Outcome: Adequate for Discharge Goal: Diagnostic test results will improve Outcome: Adequate for Discharge Goal: Respiratory complications will improve Outcome: Adequate for Discharge Goal: Cardiovascular complication will be avoided Outcome: Adequate for Discharge   Problem: Activity: Goal: Risk for activity intolerance will decrease Outcome: Adequate for Discharge   Problem: Nutrition: Goal: Adequate nutrition will be maintained Outcome: Adequate for Discharge   Problem: Coping: Goal: Level of anxiety will decrease Outcome: Adequate for Discharge   Problem: Elimination: Goal: Will not experience complications related to bowel motility Outcome: Adequate for Discharge Goal: Will not experience complications related to urinary retention Outcome: Adequate for Discharge   Problem: Pain Managment: Goal: General experience of comfort will improve Outcome: Adequate for Discharge   Problem: Safety: Goal: Ability to remain free from injury will improve Outcome: Adequate for Discharge   Problem: Skin Integrity: Goal: Risk for impaired skin integrity will decrease Outcome: Adequate for Discharge   Problem: Education: Goal: Utilization of techniques to improve thought processes will improve Outcome: Adequate for Discharge Goal: Knowledge of the prescribed therapeutic regimen will improve Outcome: Adequate for Discharge   Problem:  Activity: Goal: Interest or engagement in leisure activities will improve Outcome: Adequate for Discharge Goal: Imbalance in normal sleep/wake cycle will improve Outcome: Adequate for Discharge   Problem: Coping: Goal: Coping ability will improve Outcome: Adequate for Discharge Goal: Will verbalize feelings Outcome: Adequate for Discharge   Problem: Health Behavior/Discharge Planning: Goal: Ability to make decisions will improve Outcome: Adequate for Discharge Goal: Compliance with therapeutic regimen will improve Outcome: Adequate for Discharge   Problem: Role Relationship: Goal: Will demonstrate positive changes in social behaviors and relationships Outcome: Adequate for Discharge   Problem: Safety: Goal: Ability to disclose and discuss suicidal ideas will improve Outcome: Adequate for Discharge Goal: Ability to identify and utilize support systems that promote safety will improve Outcome: Adequate for Discharge   Problem: Self-Concept: Goal: Will verbalize positive feelings about self Outcome: Adequate for Discharge Goal: Level of anxiety will decrease Outcome: Adequate for Discharge   Problem: Education: Goal: Knowledge of [place] [facility] Education information/materials will improve Outcome: Adequate for Discharge Goal: Emotional status will improve Outcome: Adequate for Discharge Goal: Mental status will improve Outcome: Adequate for Discharge Goal: Verbalization of understanding the information provided will improve Outcome: Adequate for Discharge   Problem: Activity: Goal: Interest or engagement in activities will improve Outcome: Adequate for Discharge Goal: Sleeping patterns will improve Outcome: Adequate for Discharge   Problem: Coping: Goal: Ability to verbalize frustrations and anger appropriately will improve Outcome: Adequate for Discharge Goal: Ability to demonstrate self-control will improve Outcome: Adequate for Discharge   Problem: Health  Behavior/Discharge Planning: Goal: Identification of resources available to assist in meeting health care needs will improve Outcome: Adequate for Discharge Goal: Compliance with treatment plan for underlying cause of condition will improve Outcome: Adequate for Discharge   Problem: Physical Regulation: Goal: Ability to maintain clinical measurements within normal limits will improve Outcome: Adequate for Discharge   Problem: Safety: Goal: Periods of time without injury will increase Outcome: Adequate for Discharge   Problem: Education: Goal: Ability  to state activities that reduce stress will improve Outcome: Adequate for Discharge   Problem: Coping: Goal: Ability to identify and develop effective coping behavior will improve Outcome: Adequate for Discharge   Problem: Self-Concept: Goal: Ability to identify factors that promote anxiety will improve Outcome: Adequate for Discharge Goal: Level of anxiety will decrease Outcome: Adequate for Discharge Goal: Ability to modify response to factors that promote anxiety will improve Outcome: Adequate for Discharge

## 2022-08-25 NOTE — Group Note (Signed)
Recreation Therapy Group Note   Group Topic:Healthy Support Systems  Group Date: 08/25/2022 Start Time: 1000 End Time: 1055 Facilitators: Vilma Prader, LRT, CTRS Location:  Craft Room  Group Description: Straw Bridge. Patients were given 10 plastic drinking straws and an equal length of masking tape. Using the materials provided, patients were instructed to build a free-standing bridge-like structure to suspend an everyday item (ex: deck of cards) off the floor or table surface. All materials were required to be used in Conservation officer, nature. LRT facilitated post-activity discussion reviewing the importance of having strong and healthy support systems in our lives. LRT discussed how the people in our lives serve as the tape and the book we placed on top of our straw structure are the stressors we face in daily life. LRT and pts discussed what happens in our life when things get too heavy for [place], and we don't have strong supports outside of the hospital. Pt shared 2 of their healthy supports aloud in the group.   Affect/Mood: Appropriate and Flat   Participation Level: Minimal   Participation Quality: Minimal Cues   Behavior: Disinterested   Speech/Thought Process: Coherent   Insight: Fair   Judgement: Limited   Modes of Intervention: Activity and Guided Discussion   Patient Response to Interventions:  Disengaged   Education Outcome:  In group clarification offered    Clinical Observations/Individualized Feedback: Lance [name] was not active in their participation of session activities and group discussion. Pt left group as soon as post activity discussion started. Pt did not participate in the activity and sat there quietly drinking coffee throughout session.    Plan: Continue to engage patient in RT group sessions 2-3x/week.   Vilma Prader, LRT, CTRS 08/25/2022 11:04 AM

## 2022-08-25 NOTE — Discharge Summary (Signed)
Physician Discharge Summary Note  Patient:  Lance [name] is an 47 y.o., male MRN:  [id number] DOB:  [birth year] Patient phone:  [phone] (home)  Patient address:   [place] [address],  Total Time spent with patient: 30 minutes  Date of Admission:  08/19/2022 Date of Discharge: 08/25/2022  Reason for Admission: Admitted after presenting to the hospital having once again walked away from his group home with some reports of making suicidal statements.  Ongoing delusions and hallucinations evident.  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[place]) Discharge Diagnoses: Principal Problem:   Schizophrenia, 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[place]) Active Problems:   Hypertension   Diabetes mellitus without complication ([place])   Hyperlipidemia   Chronic anticoagulation   Tobacco use disorder   Insomnia   Past Psychiatric History: Long history of schizophrenia.  Followed by ACT team.  Multiple hospitalizations.  Frequently walks away from group home and will come to the hospital for a few days of respite and recovery.  Past history of aggression when younger less common recently.  Past Medical History:  Past Medical History:  Diagnosis Date   Depression    Diabetes mellitus without complication ([place])    Hyperlipidemia    Hypertension    Lupus anticoagulant disorder ([place]) 06/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   PE (pulmonary thromboembolism) ([place])    Schizo affective schizophrenia ([place])    Supratherapeutic INR 11/19/2016   History reviewed. No pertinent surgical history. Family History:  Family History  Problem Relation Age of Onset   CAD Mother    CAD Sister    Family Psychiatric  History: Family history see above. Social History:  Social History   Substance and Sexual Activity  Alcohol Use Not Currently   Comment: occassionally     Social History   Substance and Sexual Activity  Drug Use Yes   Types: Marijuana   Comment:  Last use 11/29/16    Social History   Socioeconomic History   Marital status: Single    Spouse name: Not on file   Number of children: Not on file   Years of education: Not on file   Highest education level: Not on file  Occupational History   Not on file  Tobacco Use   Smoking status: Every Day    Packs/day: 1.00    Years: 23.00    Total pack years: 23.00    Types: Cigarettes   Smokeless tobacco: Never   Tobacco comments:    will provide material  Vaping Use   Vaping Use: Some days  Substance and Sexual Activity   Alcohol use: Not Currently    Comment: occassionally   Drug use: Yes    Types: Marijuana    Comment: Last use 11/29/16   Sexual activity: Not Currently    Birth control/protection: None    Comment: occasional marijuana- none recently  Other Topics Concern   Not on file  Social History Narrative   From a group home in [place] Determinants of Health   Financial Resource Strain: Low Risk  (03/20/2021)   Overall Financial Resource Strain (CARDIA)    Difficulty of Paying Living Expenses: Not very hard  Food Insecurity: No Food Insecurity (08/19/2022)   Hunger Vital Sign    Worried About Running Out of Food in the Last Year: Never true    Ran Out of Food in the Last Year: Never true  Recent Concern: Food Insecurity - Food Insecurity Present (06/17/2022)   Hunger  Vital Sign    Worried About Charity fundraiser in the Last Year: Sometimes true    Ran Out of Food in the Last Year: Sometimes true  Transportation Needs: Unmet Transportation Needs (08/19/2022)   PRAPARE - Hydrologist (Medical): Yes    Lack of Transportation (Non-Medical): Yes  Physical Activity: Insufficiently Active (03/20/2021)   Exercise Vital Sign    Days of Exercise per Week: 3 days    Minutes of Exercise per Session: 20 min  Stress: No Stress Concern Present (03/20/2021)   [place]     Feeling of Stress : Only a little  Social Connections: Socially Isolated (03/20/2021)   Social Connection and Isolation Panel [NHANES]    Frequency of Communication with Friends and Family: More than three times a week    Frequency of Social Gatherings with Friends and Family: More than three times a week    Attends Religious Services: Never    Marine scientist or Organizations: No    Attends Archivist Meetings: Never    Marital Status: Never married    Hospital Course: Patient admitted to psychiatric unit.  15-minute checks continued.  Patient was evidencing psychotic with delusional statements disorganized thinking and paranoia.  Nevertheless he was cooperative with medication.  During his time in the hospital I continued his usual medicine but also increased the dose of his Lozamine to 250 mg at night.  Initially he seemed a little more sedated but has now adapted to it and seems to be back to his baseline.  Did not display any aggression or violence in the hospital.  He did not make any suicidal statements or show any dangerous behaviors to himself.  Patient was in contact with his ACT team and I spoke with someone there about his long-term wish to move to a different group home.  Evidently they are working on it but it is not something that will happen in the immediate future.  The patient will be discharged back to his current group home with usual follow-up on his medicine prescriptions provided.  Physical Findings: AIMS: Facial and Oral Movements Muscles of Facial Expression: None, normal Lips and Perioral Area: None, normal Jaw: None, normal Tongue: None, normal,Extremity Movements Upper (arms, wrists, hands, fingers): None, normal Lower (legs, knees, ankles, toes): None, normal, Trunk Movements Neck, shoulders, hips: None, normal, Overall Severity Severity of abnormal movements (highest score from questions above): None, normal Incapacitation due to abnormal  movements: None, normal Patient's awareness of abnormal movements (rate only patient's report): No Awareness, Dental Status Current problems with teeth and/or dentures?: No Does patient usually wear dentures?: No  CIWA:    COWS:     Musculoskeletal: Strength & Muscle Tone: within normal limits Gait & Station: normal Patient leans: N/A   Psychiatric Specialty Exam:  Presentation  General Appearance:  Bizarre  Eye Contact: Minimal  Speech: Pressured  Speech Volume: Increased  Handedness: Right   Mood and Affect  Mood: Anxious; Euphoric; Irritable  Affect: Congruent   Thought Process  Thought Processes: Disorganized  Descriptions of Associations:Loose  Orientation:Full (Time, Place and Person)  Thought Content:Illogical  History of Schizophrenia/Schizoaffective disorder:Yes  Duration of Psychotic Symptoms:Greater than six months  Hallucinations:No data recorded Ideas of Reference:Paranoia  Suicidal Thoughts:No data recorded Homicidal Thoughts:No data recorded  Sensorium  Memory: Immediate Fair; Recent Fair; Remote Fair  Judgment: Poor  Insight: Poor   Executive Functions  Concentration:  Poor  Attention Span: Poor  Recall: Poor  Fund of Knowledge: Poor  Language: Poor   Psychomotor Activity  Psychomotor Activity:No data recorded  Assets  Assets: Communication Skills; Desire for Improvement; Housing; Physical Health; Resilience; Social Support   Sleep  Sleep:No data recorded   Physical Exam: Physical Exam Vitals and nursing note reviewed.  Constitutional:      Appearance: Normal appearance.  HENT:     Head: Normocephalic and atraumatic.     Mouth/Throat:     Pharynx: Oropharynx is clear.  Eyes:     Pupils: Pupils are equal, round, and reactive to light.  Cardiovascular:     Rate and Rhythm: Normal rate and regular rhythm.  Pulmonary:     Effort: Pulmonary effort is normal.     Breath sounds: Normal breath  sounds.  Abdominal:     General: Abdomen is flat.     Palpations: Abdomen is soft.  Musculoskeletal:        General: Normal range of motion.  Skin:    General: Skin is warm and dry.  Neurological:     General: No focal deficit present.     Mental Status: He is alert. Mental status is at baseline.  Psychiatric:        Attention and Perception: Attention normal.        Mood and Affect: Mood normal.        Speech: Speech normal.        Behavior: Behavior normal.        Thought Content: Thought content normal.        Cognition and Memory: Cognition normal.        Judgment: Judgment normal.    Review of Systems  Constitutional: Negative.   HENT: Negative.    Eyes: Negative.   Respiratory: Negative.    Cardiovascular: Negative.   Gastrointestinal: Negative.   Musculoskeletal: Negative.   Skin: Negative.   Neurological: Negative.   Psychiatric/Behavioral: Negative.     Blood pressure 107/73, pulse 64, temperature 98 F (36.7 C), temperature source Oral, resp. rate 18, height 5' 10.5" (1.791 m), weight 82.6 kg, SpO2 99 %. Body mass index is 25.75 kg/m.   Social History   Tobacco Use  Smoking Status Every Day   Packs/day: 1.00   Years: 23.00   Total pack years: 23.00   Types: Cigarettes  Smokeless Tobacco Never  Tobacco Comments   will provide material   Tobacco Cessation:  A prescription for an FDA-approved tobacco cessation medication provided at discharge   Blood Alcohol level:  Lab Results  Component Value Date   [facility] <10 08/18/2022   ETH <10 123456    Metabolic Disorder Labs:  Lab Results  Component Value Date   HGBA1C 4.9 04/26/2022   MPG 93.93 04/26/2022   MPG 108 11/12/2020   Lab Results  Component Value Date   PROLACTIN 2.0 (L) 08/31/2016   PROLACTIN 4.8 05/22/2016   Lab Results  Component Value Date   CHOL 163 04/28/2022   TRIG 378 (H) 04/28/2022   HDL 35 (L) 04/28/2022   CHOLHDL 4.7 04/28/2022   VLDL 76 (H) 04/28/2022   LDLCALC 52  04/28/2022   LDLCALC UNABLE TO CALCULATE IF TRIGLYCERIDE OVER 400 mg/dL 09/20/2020    See Psychiatric Specialty Exam and Suicide Risk Assessment completed by Attending Physician prior to discharge.  Discharge destination:  Home  Is patient on multiple antipsychotic therapies at discharge:  No   Has Patient had three or more failed trials of  antipsychotic monotherapy by history:  Yes,   Antipsychotic medications that previously failed include:   1.  Zyprexa., 2.  Haldol., and 3.  Risperdal.  Recommended Plan for Multiple Antipsychotic Therapies: Second antipsychotic is Clozapine.  Reason for adding Clozapine patient has required additional medicine on top of clozapine for stability.  Stable on medicine.  Discharge Instructions     Diet - low sodium heart healthy   Complete by: As directed    Increase activity slowly   Complete by: As directed       Allergies as of 08/25/2022       Reactions   Penicillins Other (See Comments)   Reaction: "lockjaw" as teen Tolerated Augmentin/Unasyn 06/09/22        Medication List     STOP taking these medications    hydrOXYzine 25 MG tablet Commonly known as: ATARAX   hydrOXYzine 50 MG tablet Commonly known as: ATARAX       TAKE these medications      Indication  albuterol 108 (90 Base) MCG/ACT inhaler Commonly known as: VENTOLIN HFA Inhale 2 puffs into the lungs every 6 (six) hours as needed for shortness of breath or wheezing. What changed: how much to take  Indication: Asthma   apixaban 5 MG Tabs tablet Commonly known as: Eliquis Take 1 tablet (5 mg total) by mouth 2 (two) times daily.  Indication: Blood Clot in a Deep Vein   blood glucose meter kit and supplies Dispense based on patient and insurance preference. Use up to four times daily as directed. (FOR ICD-10 E10.9, E11.9).  Indication: Diabetes   clonazePAM 0.25 MG disintegrating tablet Commonly known as: KLONOPIN Take 1 tablet (0.25 mg total) by mouth 2 (two)  times daily as needed (anxiety).  Indication: Feeling Anxious   clozapine 50 MG tablet Commonly known as: CLOZARIL Take 5 tablets (250 mg total) by mouth at bedtime. What changed:  medication strength how much to take  Indication: Schizophrenia that does Not Respond to Usual Drug Therapy   fluticasone-salmeterol 250-50 MCG/ACT Aepb Commonly known as: ADVAIR Inhale 1 puff into the lungs 2 (two) times daily.  Indication: Asthma   FORA V30a Blood Glucose Test test strip Generic drug: glucose blood CHECK BLOOD SUGAR TWICE DAILY.  Indication: Diabetes   insulin detemir 100 UNIT/ML injection Commonly known as: LEVEMIR Inject 0.1 mLs (10 Units total) into the skin 2 (two) times daily.  Indication: Type 2 Diabetes   lisinopril 2.5 MG tablet Commonly known as: ZESTRIL Take 1 tablet (2.5 mg total) by mouth daily.  Indication: High Blood Pressure Disorder   lithium carbonate 450 MG ER tablet Commonly known as: ESKALITH Take 1 tablet (450 mg total) by mouth at bedtime.  Indication: Schizoaffective Disorder   metFORMIN 1000 MG tablet Commonly known as: GLUCOPHAGE Take 1 tablet (1,000 mg total) by mouth 2 (two) times daily with a meal.  Indication: Type 2 Diabetes   nicotine polacrilex 2 MG gum Commonly known as: NICORETTE Take 1 each (2 mg total) by mouth as needed for smoking cessation. What changed: when to take this  Indication: Nicotine Addiction   NovoFine Autocover Pen Needle 30G X 8 MM Misc Generic drug: Insulin Pen Needle USE WITH LEVEMIR TWICE DAILY.  Indication: Diabetes   OLANZapine 20 MG tablet Commonly known as: ZYPREXA Take 1 tablet (20 mg total) by mouth at bedtime.  Indication: Depressive Phase of Manic-Depression   propranolol 20 MG tablet Commonly known as: INDERAL Take 1 tablet (20 mg total) by mouth  at bedtime.  Indication: Feeling Anxious   sertraline 100 MG tablet Commonly known as: ZOLOFT Take 1 tablet (100 mg total) by mouth daily.   Indication: Major Depressive Disorder   simvastatin 40 MG tablet Commonly known as: ZOCOR Take 1 tablet (40 mg total) by mouth daily at 6 PM.  Indication: Nonfamilial Hypercholesterolemia   traZODone 100 MG tablet Commonly known as: DESYREL Take 1 tablet (100 mg total) by mouth at bedtime.  Indication: Trouble Sleeping         Follow-up recommendations:  Other:  He will be discharged back to his group home and continue to follow-up with his ACT team  Comments: Prescriptions sent directly to the pharmacy.  Signed: Alethia Berthold, MD 08/25/2022, 10:22 AM

## 2022-08-26 ENCOUNTER — Telehealth: Payer: Self-pay

## 2022-08-26 NOTE — Transitions of Care (Post Inpatient/ED Visit) (Signed)
   08/26/2022  Name: Lance [name] MRN: [id number] DOB: 24-Apr-1976  Today's TOC FU Call Status: Today's TOC FU Call Status:: Unsuccessul Call (1st Attempt) Unsuccessful Call (1st Attempt) Date: 08/26/22  Attempted to reach the patient regarding the most recent Inpatient/ED visit.  Follow Up Plan: Additional outreach attempts will be made to reach the patient to complete the Transitions of Care (Post Inpatient/ED visit) call.   [place] LPN [place] Advisor Direct Dial [phone]

## 2022-08-27 NOTE — Transitions of Care (Post Inpatient/ED Visit) (Signed)
   08/27/2022  Name: Lance [name] MRN: [id number] DOB: 1975-06-17  Today's TOC FU Call Status: Today's TOC FU Call Status:: Unsuccessful Call (2nd Attempt) Unsuccessful Call (1st Attempt) Date: 08/26/22 Unsuccessful Call (2nd Attempt) Date: 08/27/22  Attempted to reach the patient regarding the most recent Inpatient/ED visit.  Follow Up Plan: Additional outreach attempts will be made to reach the patient to complete the Transitions of Care (Post Inpatient/ED visit) call.   [place] LPN [place] Advisor Direct Dial [phone]

## 2022-08-31 NOTE — Transitions of Care (Post Inpatient/ED Visit) (Signed)
   08/31/2022  Name: Lance [name] MRN: [id number] DOB: December 09, 1975  Today's TOC FU Call Status: Today's TOC FU Call Status:: Unsuccessful Call (3rd Attempt) Unsuccessful Call (1st Attempt) Date: 08/26/22 Unsuccessful Call (2nd Attempt) Date: 08/27/22 Unsuccessful Call (3rd Attempt) Date: 08/31/22  Attempted to reach the patient regarding the most recent Inpatient/ED visit.  Follow Up Plan: No further outreach attempts will be made at this time. We have been unable to contact the patient.  [place] LPN [place] Advisor Direct Dial [phone]

## 2022-09-06 ENCOUNTER — Other Ambulatory Visit: Payer: Self-pay

## 2022-09-06 ENCOUNTER — Emergency Department
Admission: EM | Admit: 2022-09-06 | Discharge: 2022-09-06 | Disposition: A | Discharge status: Home / Self Care | Payer: Medicare Other | Attending: Emergency Medicine | Admitting: Emergency Medicine

## 2022-09-06 DIAGNOSIS — I10 Essential (primary) hypertension: Secondary | ICD-10-CM | POA: Diagnosis not present

## 2022-09-06 DIAGNOSIS — L739 Follicular disorder, unspecified: Secondary | ICD-10-CM

## 2022-09-06 DIAGNOSIS — E119 Type 2 diabetes mellitus without complications: Secondary | ICD-10-CM | POA: Diagnosis not present

## 2022-09-06 DIAGNOSIS — Z1152 Encounter for screening for COVID-19: Secondary | ICD-10-CM | POA: Diagnosis not present

## 2022-09-06 DIAGNOSIS — Z7901 Long term (current) use of anticoagulants: Secondary | ICD-10-CM | POA: Diagnosis not present

## 2022-09-06 DIAGNOSIS — R45851 Suicidal ideations: Secondary | ICD-10-CM

## 2022-09-06 DIAGNOSIS — F250 Schizoaffective disorder, bipolar type: Secondary | ICD-10-CM | POA: Diagnosis not present

## 2022-09-06 DIAGNOSIS — F209 Schizophrenia, unspecified: Secondary | ICD-10-CM | POA: Diagnosis not present

## 2022-09-06 DIAGNOSIS — F319 Bipolar disorder, unspecified: Secondary | ICD-10-CM | POA: Diagnosis present

## 2022-09-06 LAB — RESP PANEL BY RT-PCR (RSV, FLU A&B, COVID)  RVPGX2
Influenza A by PCR: NEGATIVE
Influenza B by PCR: NEGATIVE
Resp Syncytial Virus by PCR: NEGATIVE
SARS Coronavirus 2 by RT PCR: NEGATIVE

## 2022-09-06 LAB — ACETAMINOPHEN LEVEL: Acetaminophen (Tylenol), Serum: <10 ug/mL — ABNORMAL LOW (ref 10–30)

## 2022-09-06 LAB — COMPREHENSIVE METABOLIC PANEL
ALT: 20 U/L (ref 0–44)
AST: 18 U/L (ref 15–41)
Albumin: 4.8 g/dL (ref 3.5–5.0)
Alkaline Phosphatase: 67 U/L (ref 38–126)
Anion gap: 6 (ref 5–15)
BUN: 23 mg/dL — ABNORMAL HIGH (ref 6–20)
CO2: 20 mmol/L — ABNORMAL LOW (ref 22–32)
Calcium: 9 mg/dL (ref 8.9–10.3)
Chloride: 110 mmol/L (ref 98–111)
Creatinine, Ser: 1.26 mg/dL — ABNORMAL HIGH (ref 0.61–1.24)
GFR, Estimated: >60 mL/min (ref >60)
Glucose, Bld: 105 mg/dL — ABNORMAL HIGH (ref 70–99)
Potassium: 3.9 mmol/L (ref 3.5–5.1)
Sodium: 136 mmol/L (ref 135–145)
Total Bilirubin: 0.8 mg/dL (ref 0.3–1.2)
Total Protein: 7.6 g/dL (ref 6.5–8.1)

## 2022-09-06 LAB — URINE DRUG SCREEN, QUALITATIVE (ARMC ONLY)
Amphetamines, Ur Screen: NOT DETECTED
Barbiturates, Ur Screen: NOT DETECTED
Benzodiazepine, Ur Scrn: NOT DETECTED
Cannabinoid 50 Ng, Ur ~~LOC~~: NOT DETECTED
Cocaine Metabolite,Ur ~~LOC~~: NOT DETECTED
MDMA (Ecstasy)Ur Screen: NOT DETECTED
Methadone Scn, Ur: NOT DETECTED
Opiate, Ur Screen: NOT DETECTED
Phencyclidine (PCP) Ur S: NOT DETECTED
Tricyclic, Ur Screen: NOT DETECTED

## 2022-09-06 LAB — CBC
HCT: 43.4 % (ref 39.0–52.0)
Hemoglobin: 14.9 g/dL (ref 13.0–17.0)
MCH: 31.5 pg (ref 26.0–34.0)
MCHC: 34.3 g/dL (ref 30.0–36.0)
MCV: 91.8 fL (ref 80.0–100.0)
Platelets: 259 10*3/uL (ref 150–400)
RBC: 4.73 MIL/uL (ref 4.22–5.81)
RDW: 13.3 % (ref 11.5–15.5)
WBC: 6.4 10*3/uL (ref 4.0–10.5)
nRBC: 0 % (ref 0.0–0.2)

## 2022-09-06 LAB — ETHANOL: Alcohol, Ethyl (B): <10 mg/dL (ref <10)

## 2022-09-06 LAB — SALICYLATE LEVEL: Salicylate Lvl: <7 mg/dL — ABNORMAL LOW (ref 7.0–30.0)

## 2022-09-06 MED ORDER — DOXYCYCLINE HYCLATE 50 MG PO CAPS: 100.0000 mg | ORAL_CAPSULE | Freq: Two times a day (BID) | ORAL | 0 refills | Status: AC

## 2022-09-06 NOTE — ED Provider Notes (Signed)
[facility] Provider Note    Event Date/Time   First MD Initiated Contact with Patient 09/06/22 1901     (approximate)   History   Psychiatric Evaluation   HPI  Lance [name] is a 47 y.o. male   Past medical history of this anticoagulant disorder, diabetes, hypertension hyperlipidemia, PE on Eliquis who presents for psychiatric evaluation from his group home where he ran away and expressed passive suicidal ideation.  He has no plan and has no self-harm attempts today.  No alcohol or ingestions.  He is inconsistent with taking his psychiatric medications.  He uses marijuana occasionally but denies any other drug use.  He is here voluntarily.  He has no other acute medical complaints.  He is passively suicidal, no plan, no homicidality and no hallucination  External Medical Documents Reviewed: Discharge summary from 08/25/2022 for schizophrenia      Physical Exam   Triage Vital Signs: ED Triage Vitals  Enc Vitals Group     BP 09/06/22 1854 118/62     Pulse Rate 09/06/22 1854 60     Resp 09/06/22 1854 16     Temp 09/06/22 1854 98 F (36.7 C)     Temp Source 09/06/22 1854 Oral     SpO2 09/06/22 1854 99 %     Weight 09/06/22 1842 176 lb 5.9 oz (80 kg)     Height 09/06/22 1842 5\' 10"  (1.778 m)     Head Circumference --      Peak Flow --      Pain Score 09/06/22 1839 0     Pain Loc --      Pain Edu? --      Excl. in [place]? --     Most recent vital signs: Vitals:   09/06/22 1854 09/06/22 2006  BP: 118/62 123/75  Pulse: 60 78  Resp: 16 17  Temp: 98 F (36.7 C) 98.1 F (36.7 C)  SpO2: 99% 97%    General: Awake, no distress.  CV:  Good peripheral perfusion.  Resp:  Normal effort.  Abd:  No distention.  Other:  Awake alert oriented pleasant cooperative.  Normal vital signs.  Lungs clear abdomen soft and nontender.   ED Results / Procedures / Treatments   Labs (all labs ordered are listed, but only abnormal results are  displayed) Labs Reviewed  COMPREHENSIVE METABOLIC PANEL - Abnormal; Notable for the following components:      Result Value   CO2 20 (*)    Glucose, Bld 105 (*)    BUN 23 (*)    Creatinine, Ser 1.26 (*)    All other components within normal limits  SALICYLATE LEVEL - Abnormal; Notable for the following components:   Salicylate Lvl [id number] (*)    All other components within normal limits  ACETAMINOPHEN LEVEL - Abnormal; Notable for the following components:   Acetaminophen (Tylenol), Serum <10 (*)    All other components within normal limits  RESP PANEL BY RT-PCR (RSV, FLU A&B, COVID)  RVPGX2  ETHANOL  CBC  URINE DRUG SCREEN, QUALITATIVE ([facility] ONLY)     I ordered and reviewed the above labs they are notable for Cr of 123456, negative salicylate and acetaminophen levels   PROCEDURES:  Critical Care performed: No  Procedures   MEDICATIONS ORDERED IN ED: Medications - No data to display  External physician / consultants:  I spoke with psychiatric consultant regarding care plan for this patient.   IMPRESSION / MDM / ASSESSMENT AND  PLAN / ED COURSE  I reviewed the triage vital signs and the nursing notes.                                Patient's presentation is most consistent with acute presentation with potential threat to life or bodily function.  Differential diagnosis includes, but is not limited to, suicidal ideation, intoxication, acute psychiatric illness    MDM: This is a patient with no acute medical complaints who is here for psychiatric evaluation for passive suicidal ideation.  He is here voluntarily.  Patient labs and toxicologic labs sent within normal limits, cleared for psychiatric evaluation.  He later tells me that he has has some bumps in his inner thigh and some redness of the skin that is irritating him.  Appears to be folliculitis with some mild cellulitic changes surrounding.  I ordered him some doxycycline rx     FINAL CLINICAL IMPRESSION(S) / ED  DIAGNOSES   Final diagnoses:  Suicidal ideation  Folliculitis     Rx / DC Orders   ED Discharge Orders          Ordered    doxycycline (VIBRAMYCIN) 50 MG capsule  2 times daily        09/06/22 2101             Note:  This document was prepared using Dragon voice recognition software and may include unintentional dictation errors.    Lucillie Garfinkel, MD 09/06/22 2134

## 2022-09-06 NOTE — ED Notes (Signed)
Snack drink given

## 2022-09-06 NOTE — ED Notes (Addendum)
Spoke with [facility] representative regarding pt discharge - denies available transportation. Pt reports [employer] officer voluntarily transported pt here. Pt telling this RN that if he is sent back to group home he will just gather his things and run away again. Pt states, "I like it here so I will be picked up by the cops and brought back here." Per MD [employer] called to arrange transportation.

## 2022-09-06 NOTE — ED Triage Notes (Signed)
Pt to ED from home. Pt lives in a group home. Pt states "I decided to get up and leave. I want to take my chances out there on the street. Group home told me I had to return. I said to hell with that. They told me to come to the hospital". Pt is CAOx4 and in no acute distress.   Pt states "I think about dying all the time. Our time is limited on this earth. One day we will die. What lies after that we dont know but its gonna happen regardless".

## 2022-09-06 NOTE — ED Notes (Addendum)
Pt dressed out: Black jacket Plaind long sleeve shirt White shirt Yellow shirt Blue jeans - 2 pair 2 belts Red shoes Black socks Purple socks necklace 2 bags 8 bracelets Plaid underwear

## 2022-09-06 NOTE — Consult Note (Signed)
Telepsych Consultation   Reason for Consult:  Psych Evaluation Referring Physician:  Dr. Jacelyn Grip  Location of Patient: ER Location of Provider: Other:    Patient Identification: Lance [name] MRN:  [id number] Principal Diagnosis: <principal problem not specified> Diagnosis:  Active Problems:   Schizoaffective disorder, bipolar type ([place])   Total Time spent with patient: 20 minutes  Subjective:   "I needed to get away"  HPI: Lance [name], 47 y.o., male patient seen by TTS and assessed by this provider; chart reviewed and consulted with Dr. Jacelyn Grip on 09/06/22.  On evaluation Lance [name] reports wanting to be away from his grouphome.  Per triage note, pt to ED from home. Pt lives in a group home. Pt states "I decided to get up and leave. I want to take my chances out there on the street. Group home told me I had to return. I said to hell with that. They told me to come to the hospital". Pt is CAOx4 and in no acute distress.    Pt states "I think about dying all the time. Our time is limited on this earth. One day we will die. What lies after that we dont know but its gonna happen regardless  Patient denies suicidal/self-harm/homicidal ideation, psychosis, and paranoia.  Patient has remained calm throughout assessment and has answered questions appropriately. Patient is at his baseline.    Past Psychiatric History: Schizoaffective disorder  Risk to Self:   Risk to Others:   Prior Inpatient Therapy:   Prior Outpatient Therapy:    Past Medical History:  Past Medical History:  Diagnosis Date   Depression    Diabetes mellitus without complication ([place])    Hyperlipidemia    Hypertension    Lupus anticoagulant disorder ([place]) 06/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   PE (pulmonary thromboembolism) ([place])    Schizo affective schizophrenia ([place])    Supratherapeutic INR 11/19/2016   History reviewed. No pertinent surgical history. Family History:  Family History   Problem Relation Age of Onset   CAD Mother    CAD Sister    Family Psychiatric  History:  Social History:  Social History   Substance and Sexual Activity  Alcohol Use Not Currently   Comment: occassionally     Social History   Substance and Sexual Activity  Drug Use Yes   Types: Marijuana   Comment: Last use 11/29/16    Social History   Socioeconomic History   Marital status: Single    Spouse name: Not on file   Number of children: Not on file   Years of education: Not on file   Highest education level: Not on file  Occupational History   Not on file  Tobacco Use   Smoking status: Every Day    Packs/day: 1.00    Years: 23.00    Additional pack years: 0.00    Total pack years: 23.00    Types: Cigarettes   Smokeless tobacco: Never   Tobacco comments:    will provide material  Vaping Use   Vaping Use: Some days  Substance and Sexual Activity   Alcohol use: Not Currently    Comment: occassionally   Drug use: Yes    Types: Marijuana    Comment: Last use 11/29/16   Sexual activity: Not Currently    Birth control/protection: None    Comment: occasional marijuana- none recently  Other Topics Concern   Not on file  Social History Narrative   From a  group home in [place] Strain: Low Risk  (03/20/2021)   Overall Financial Resource Strain (CARDIA)    Difficulty of Paying Living Expenses: Not very hard  Food Insecurity: No Food Insecurity (08/19/2022)   Hunger Vital Sign    Worried About Running Out of Food in the Last Year: Never true    Ran Out of Food in the Last Year: Never true  Recent Concern: [place] Present (06/17/2022)   Hunger Vital Sign    Worried About Running Out of Food in the Last Year: Sometimes true    Ran Out of Food in the Last Year: Sometimes true  Transportation Needs: Unmet Transportation Needs (08/19/2022)   PRAPARE - Hydrologist  (Medical): Yes    Lack of Transportation (Non-Medical): Yes  Physical Activity: Insufficiently Active (03/20/2021)   Exercise Vital Sign    Days of Exercise per Week: 3 days    Minutes of Exercise per Session: 20 min  Stress: No Stress Concern Present (03/20/2021)   [place]    Feeling of Stress : Only a little  Social Connections: Socially Isolated (03/20/2021)   Social Connection and Isolation Panel [NHANES]    Frequency of Communication with Friends and Family: More than three times a week    Frequency of Social Gatherings with Friends and Family: More than three times a week    Attends Religious Services: Never    Marine scientist or Organizations: No    Attends Archivist Meetings: Never    Marital Status: Never married   Additional Social History:    Allergies:   Allergies  Allergen Reactions   Penicillins Other (See Comments)    Reaction: "lockjaw" as teen Tolerated Augmentin/Unasyn 06/09/22      Labs:  Results for orders placed or performed during the hospital encounter of 09/06/22 (from the past 48 hour(s))  Comprehensive metabolic panel     Status: Abnormal   Collection Time: 09/06/22  6:45 PM  Result Value Ref Range   Sodium 136 135 - 145 mmol/L   Potassium 3.9 3.5 - 5.1 mmol/L   Chloride 110 98 - 111 mmol/L   CO2 20 (L) 22 - 32 mmol/L   Glucose, Bld 105 (H) 70 - 99 mg/dL    Comment: Glucose reference range applies only to samples taken after fasting for at least 8 hours.   BUN 23 (H) 6 - 20 mg/dL   Creatinine, Ser 1.26 (H) 0.61 - 1.24 mg/dL   Calcium 9.0 8.9 - 10.3 mg/dL   Total Protein 7.6 6.5 - 8.1 g/dL   Albumin 4.8 3.5 - 5.0 g/dL   AST 18 15 - 41 U/L   ALT 20 0 - 44 U/L   Alkaline Phosphatase 67 38 - 126 U/L   Total Bilirubin 0.8 0.3 - 1.2 mg/dL   GFR, Estimated >60 >60 mL/min    Comment: (NOTE) Calculated using the CKD-EPI Creatinine Equation (2021)    Anion gap 6 5  - 15    Comment: Performed at [facility], [address] [place], [place] [address]  Ethanol     Status: None   Collection Time: 09/06/22  6:45 PM  Result Value Ref Range   Alcohol, Ethyl (B) <10 <10 mg/dL    Comment: (NOTE) Lowest detectable limit for serum alcohol is 10 mg/dL.  For medical purposes only. Performed  at [facility], [address] [place], [place] [id number]   Salicylate level     Status: Abnormal   Collection Time: 09/06/22  6:45 PM  Result Value Ref Range   Salicylate Lvl [id number] (L) 7.0 - 30.0 mg/dL    Comment: Performed at [facility], [address] [place], [place] [address]  Acetaminophen level     Status: Abnormal   Collection Time: 09/06/22  6:45 PM  Result Value Ref Range   Acetaminophen (Tylenol), Serum <10 (L) 10 - 30 ug/mL    Comment: (NOTE) Therapeutic concentrations vary significantly. A range of 10-30 ug/mL  may be an effective concentration for many patients. However, some  are best treated at concentrations outside of this range. Acetaminophen concentrations >150 ug/mL at 4 hours after ingestion  and >50 ug/mL at 12 hours after ingestion are often associated with  toxic reactions.  Performed at [facility], [address] [place], [place] [address]   cbc     Status: None   Collection Time: 09/06/22  6:45 PM  Result Value Ref Range   WBC 6.4 4.0 - 10.5 K/uL   RBC 4.73 4.22 - 5.81 MIL/uL   Hemoglobin 14.9 13.0 - 17.0 g/dL   HCT 43.4 39.0 - 52.0 %   MCV 91.8 80.0 - 100.0 fL   MCH 31.5 26.0 - 34.0 pg   MCHC 34.3 30.0 - 36.0 g/dL   RDW 13.3 11.5 - 15.5 %   Platelets 259 150 - 400 K/uL   nRBC 0.0 0.0 - 0.2 %    Comment: Performed at [facility], [address] [place], [place] [address]  Urine Drug Screen, Qualitative     Status: None   Collection Time: 09/06/22  7:20 PM  Result Value Ref Range   Tricyclic, Ur Screen NONE DETECTED NONE DETECTED   Amphetamines, Ur Screen NONE  DETECTED NONE DETECTED   MDMA (Ecstasy)Ur Screen NONE DETECTED NONE DETECTED   Cocaine Metabolite,Ur [place] NONE DETECTED NONE DETECTED   Opiate, Ur Screen NONE DETECTED NONE DETECTED   Phencyclidine (PCP) Ur S NONE DETECTED NONE DETECTED   Cannabinoid 50 Ng, Ur [place] NONE DETECTED NONE DETECTED   Barbiturates, Ur Screen NONE DETECTED NONE DETECTED   Benzodiazepine, Ur Scrn NONE DETECTED NONE DETECTED   Methadone Scn, Ur NONE DETECTED NONE DETECTED    Comment: (NOTE) Tricyclics + metabolites, urine    Cutoff 1000 ng/mL Amphetamines + metabolites, urine  Cutoff 1000 ng/mL MDMA (Ecstasy), urine              Cutoff 500 ng/mL Cocaine Metabolite, urine          Cutoff 300 ng/mL Opiate + metabolites, urine        Cutoff 300 ng/mL Phencyclidine (PCP), urine         Cutoff 25 ng/mL Cannabinoid, urine                 Cutoff 50 ng/mL Barbiturates + metabolites, urine  Cutoff 200 ng/mL Benzodiazepine, urine              Cutoff 200 ng/mL Methadone, urine                   Cutoff 300 ng/mL  The urine drug screen provides only a preliminary, unconfirmed analytical test result and should not be used for non-medical purposes. Clinical consideration and professional judgment should be applied to any positive drug screen result due to possible interfering substances. A more specific  alternate chemical method must be used in order to obtain a confirmed analytical result. Gas chromatography / mass spectrometry (GC/MS) is the preferred confirm atory method. Performed at [facility], [address] [place], [place] [address]   Resp panel by RT-PCR (RSV, Flu A&B, Covid) Anterior Nasal Swab     Status: None   Collection Time: 09/06/22  7:20 PM   Specimen: Anterior Nasal Swab  Result Value Ref Range   SARS Coronavirus 2 by RT PCR NEGATIVE NEGATIVE    Comment: (NOTE) SARS-CoV-2 target nucleic acids are NOT DETECTED.  The SARS-CoV-2 RNA is generally detectable in upper respiratory specimens during  the acute phase of infection. The lowest concentration of SARS-CoV-2 viral copies this assay can detect is 138 copies/mL. A negative result does not preclude SARS-Cov-2 infection and should not be used as the sole basis for treatment or other patient management decisions. A negative result may occur with  improper specimen collection/handling, submission of specimen other than nasopharyngeal swab, presence of viral mutation(s) within the areas targeted by this assay, and inadequate number of viral copies(<138 copies/mL). A negative result must be combined with clinical observations, patient history, and epidemiological information. The expected result is Negative.  Fact Sheet for Patients:  EntrepreneurPulse.com.au  Fact Sheet for Healthcare Providers:  IncredibleEmployment.be  This test is no t yet approved or cleared by the [place] FDA and  has been authorized for detection and/or diagnosis of SARS-CoV-2 by FDA under an Emergency Use Authorization (EUA). This EUA will remain  in effect (meaning this test can be used) for the duration of the COVID-19 declaration under Section 564(b)(1) of the Act, 21 U.S.C.section 360bbb-3(b)(1), unless the authorization is terminated  or revoked sooner.       Influenza A by PCR NEGATIVE NEGATIVE   Influenza B by PCR NEGATIVE NEGATIVE    Comment: (NOTE) The Xpert Xpress SARS-CoV-2/FLU/RSV plus assay is intended as an aid in the diagnosis of influenza from Nasopharyngeal swab specimens and should not be used as a sole basis for treatment. Nasal washings and aspirates are unacceptable for Xpert Xpress SARS-CoV-2/FLU/RSV testing.  Fact Sheet for Patients: EntrepreneurPulse.com.au  Fact Sheet for Healthcare Providers: IncredibleEmployment.be  This test is not yet approved or cleared by the [place] FDA and has been authorized for detection and/or diagnosis of  SARS-CoV-2 by FDA under an Emergency Use Authorization (EUA). This EUA will remain in effect (meaning this test can be used) for the duration of the COVID-19 declaration under Section 564(b)(1) of the Act, 21 U.S.C. section 360bbb-3(b)(1), unless the authorization is terminated or revoked.     Resp Syncytial Virus by PCR NEGATIVE NEGATIVE    Comment: (NOTE) Fact Sheet for Patients: EntrepreneurPulse.com.au  Fact Sheet for Healthcare Providers: IncredibleEmployment.be  This test is not yet approved or cleared by the [place] FDA and has been authorized for detection and/or diagnosis of SARS-CoV-2 by FDA under an Emergency Use Authorization (EUA). This EUA will remain in effect (meaning this test can be used) for the duration of the COVID-19 declaration under Section 564(b)(1) of the Act, 21 U.S.C. section 360bbb-3(b)(1), unless the authorization is terminated or revoked.  Performed at [facility], [address] [place], [place] [address]     Medications:  No current facility-administered medications for this encounter.   Current Outpatient Medications  Medication Sig Dispense Refill   albuterol (VENTOLIN HFA) 108 (90 Base) MCG/ACT inhaler Inhale 2 puffs into the lungs every 6 (six) hours as needed for shortness  of breath or wheezing. 18 g 1   apixaban (ELIQUIS) 5 MG TABS tablet Take 1 tablet (5 mg total) by mouth 2 (two) times daily. 60 tablet 1   blood glucose meter kit and supplies Dispense based on patient and insurance preference. Use up to four times daily as directed. (FOR ICD-10 E10.9, E11.9). 1 each 6   clonazePAM (KLONOPIN) 0.25 MG disintegrating tablet Take 1 tablet (0.25 mg total) by mouth 2 (two) times daily as needed (anxiety). 60 tablet 1   cloZAPine (CLOZARIL) 50 MG tablet Take 5 tablets (250 mg total) by mouth at bedtime. 150 tablet 0   fluticasone-salmeterol (ADVAIR) 250-50 MCG/ACT AEPB Inhale 1 puff into the  lungs 2 (two) times daily. 60 each 1   glucose blood (FORA V30A BLOOD GLUCOSE TEST) test strip CHECK BLOOD SUGAR TWICE DAILY. 100 strip 3   insulin detemir (LEVEMIR) 100 UNIT/ML injection Inject 0.1 mLs (10 Units total) into the skin 2 (two) times daily. 10 mL 1   lisinopril (ZESTRIL) 2.5 MG tablet Take 1 tablet (2.5 mg total) by mouth daily. 30 tablet 1   lithium carbonate (ESKALITH) 450 MG ER tablet Take 1 tablet (450 mg total) by mouth at bedtime. 30 tablet 1   metFORMIN (GLUCOPHAGE) 1000 MG tablet Take 1 tablet (1,000 mg total) by mouth 2 (two) times daily with a meal. 60 tablet 1   nicotine polacrilex (NICORETTE) 2 MG gum Take 1 each (2 mg total) by mouth as needed for smoking cessation. 100 tablet 1   NOVOFINE AUTOCOVER PEN NEEDLE 30G X 8 MM MISC USE WITH LEVEMIR TWICE DAILY. 100 each 11   OLANZapine (ZYPREXA) 20 MG tablet Take 1 tablet (20 mg total) by mouth at bedtime. 30 tablet 1   propranolol (INDERAL) 20 MG tablet Take 1 tablet (20 mg total) by mouth at bedtime. 30 tablet 1   sertraline (ZOLOFT) 100 MG tablet Take 1 tablet (100 mg total) by mouth daily. 30 tablet 1   simvastatin (ZOCOR) 40 MG tablet Take 1 tablet (40 mg total) by mouth daily at 6 PM. 30 tablet 1   traZODone (DESYREL) 100 MG tablet Take 1 tablet (100 mg total) by mouth at bedtime. 30 tablet 1    Musculoskeletal: Strength & Muscle Tone: within normal limits Gait & Station: normal Patient leans: N/A  Sleep  Sleep:No data recorded   Physical Exam: Physical Exam Vitals and nursing note reviewed.  Psychiatric:        Attention and Perception: Attention and perception normal.        Mood and Affect: Mood normal.        Speech: Speech normal.        Behavior: Behavior is cooperative.        Thought Content: Thought content normal.    ROS Blood pressure 123/75, pulse 78, temperature 98.1 F (36.7 C), temperature source Oral, resp. rate 17, height 5\' 10"  (1.778 m), weight 80 kg, SpO2 97 %. Body mass index is  25.31 kg/m.  Treatment Plan Summary: Return to group home  Disposition: No evidence of imminent risk to self or others at present.   Patient does not meet criteria for psychiatric inpatient admission. Discussed crisis plan, support from social network, calling 911, coming to the Emergency Department, and calling Suicide Hotline. Return to grouphome    Deloria Lair, NP 09/06/2022 8:59 PM
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
## 2022-09-07 ENCOUNTER — Emergency Department
Admission: EM | Admit: 2022-09-07 | Discharge: 2022-09-07 | Payer: Medicare Other | Attending: Emergency Medicine | Admitting: Emergency Medicine

## 2022-09-07 DIAGNOSIS — D6862 Lupus anticoagulant syndrome: Secondary | ICD-10-CM | POA: Diagnosis not present

## 2022-09-07 DIAGNOSIS — E119 Type 2 diabetes mellitus without complications: Secondary | ICD-10-CM

## 2022-09-07 DIAGNOSIS — B356 Tinea cruris: Secondary | ICD-10-CM | POA: Diagnosis not present

## 2022-09-07 DIAGNOSIS — F99 Mental disorder, not otherwise specified: Secondary | ICD-10-CM | POA: Diagnosis not present

## 2022-09-07 DIAGNOSIS — F17200 Nicotine dependence, unspecified, uncomplicated: Secondary | ICD-10-CM | POA: Diagnosis present

## 2022-09-07 DIAGNOSIS — F17290 Nicotine dependence, other tobacco product, uncomplicated: Secondary | ICD-10-CM | POA: Diagnosis not present

## 2022-09-07 DIAGNOSIS — F203 Undifferentiated schizophrenia: Secondary | ICD-10-CM | POA: Diagnosis present

## 2022-09-07 DIAGNOSIS — F209 Schizophrenia, unspecified: Secondary | ICD-10-CM

## 2022-09-07 LAB — ETHANOL: Alcohol, Ethyl (B): <10 mg/dL (ref <10)

## 2022-09-07 LAB — COMPREHENSIVE METABOLIC PANEL
ALT: 18 U/L (ref 0–44)
AST: 22 U/L (ref 15–41)
Albumin: 4.5 g/dL (ref 3.5–5.0)
Alkaline Phosphatase: 69 U/L (ref 38–126)
Anion gap: 10 (ref 5–15)
BUN: 31 mg/dL — ABNORMAL HIGH (ref 6–20)
CO2: 20 mmol/L — ABNORMAL LOW (ref 22–32)
Calcium: 9.2 mg/dL (ref 8.9–10.3)
Chloride: 107 mmol/L (ref 98–111)
Creatinine, Ser: 1.25 mg/dL — ABNORMAL HIGH (ref 0.61–1.24)
GFR, Estimated: >60 mL/min (ref >60)
Glucose, Bld: 142 mg/dL — ABNORMAL HIGH (ref 70–99)
Potassium: 4.1 mmol/L (ref 3.5–5.1)
Sodium: 137 mmol/L (ref 135–145)
Total Bilirubin: 0.8 mg/dL (ref 0.3–1.2)
Total Protein: 7.3 g/dL (ref 6.5–8.1)

## 2022-09-07 LAB — ACETAMINOPHEN LEVEL: Acetaminophen (Tylenol), Serum: <10 ug/mL — ABNORMAL LOW (ref 10–30)

## 2022-09-07 LAB — CBC
HCT: 40.8 % (ref 39.0–52.0)
Hemoglobin: 14.1 g/dL (ref 13.0–17.0)
MCH: 31.6 pg (ref 26.0–34.0)
MCHC: 34.6 g/dL (ref 30.0–36.0)
MCV: 91.5 fL (ref 80.0–100.0)
Platelets: 283 10*3/uL (ref 150–400)
RBC: 4.46 MIL/uL (ref 4.22–5.81)
RDW: 13.4 % (ref 11.5–15.5)
WBC: 8.1 10*3/uL (ref 4.0–10.5)
nRBC: 0 % (ref 0.0–0.2)

## 2022-09-07 LAB — SALICYLATE LEVEL: Salicylate Lvl: <7 mg/dL — ABNORMAL LOW (ref 7.0–30.0)

## 2022-09-07 MED ORDER — CLOTRIMAZOLE 1 % EX CREA: 1.0000 | TOPICAL_CREAM | Freq: Two times a day (BID) | CUTANEOUS | 0 refills | Status: DC

## 2022-09-07 NOTE — ED Provider Notes (Signed)
[facility] Provider Note    Event Date/Time   First MD Initiated Contact with Patient 09/07/22 2134     (approximate)   History   Mental Health Problem   HPI  Lance [name] is a 47 y.o. male past medical history of schizophrenia, hypertension, hyperlipidemia, diabetes who presents because he is seeing demons.  Patient seen evaluated in the ED by psychiatry last night was clear.  Tells me he has been seeing demons, that he is fighting a spiritual battle that he cannot lay in.  He is denying SI currently says he occasionally does feel this way.  Denies HI.  He says he is not taking his medications.  Uses marijuana but no other drugs.  Patient also endorses rash on his groin area and upper thighs that is itchy.  Denies any new exposures.  No known insect bites.     Past Medical History:  Diagnosis Date   Depression    Diabetes mellitus without complication ([place])    Hyperlipidemia    Hypertension    Lupus anticoagulant disorder ([place]) 06/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[place]) 08/19/2022   Odontogenic infection of jaw 06/09/2022   Cellulitis of face 06/06/2022   AKI (acute kidney injury) ([place]) 06/06/2022   Boxer's metacarpal fracture, neck, closed 04/27/2022   Psychosis ([place]) 04/27/2022   Lupus anticoagulant syndrome ([place]) 03/05/2022   Insomnia    Hallucinations    Suicidal ideation    Alcohol abuse 04/12/2018   Acute psychosis ([place]) 10/15/2017   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   Tobacco use disorder 09/01/2016   Schizoaffective disorder, bipolar type ([place]) 05/22/2016   Diabetes mellitus without complication ([place]) [id number]   Hyperlipidemia 05/21/2016   Chronic anticoagulation 05/21/2016   Hypertension  09/25/2015     Physical Exam  Triage Vital Signs: ED Triage Vitals  Enc Vitals Group     BP 09/07/22 2121 129/72     Pulse Rate 09/07/22 2121 86     Resp 09/07/22 2121 19     Temp 09/07/22 2121 98 F (36.7 C)     Temp Source 09/07/22 2121 Oral     SpO2 09/07/22 2121 99 %     Weight 09/07/22 2112 175 lb (79.4 kg)     Height 09/07/22 2112 5\' 10"  (1.778 m)     Head Circumference --      Peak Flow --      Pain Score 09/07/22 2112 0     Pain Loc --      Pain Edu? --      Excl. in [place]? --     Most recent vital signs: Vitals:   09/07/22 2121  BP: 129/72  Pulse: 86  Resp: 19  Temp: 98 F (36.7 C)  SpO2: 99%     General: Awake, no distress.  CV:  Good peripheral perfusion.  Resp:  Normal effort.  Abd:  No distention.  Neuro:             Awake, Alert, Oriented x 3  Other:  Patient has delusional thinking but is calm and cooperative does not appear to be reacting to internal stimuli with normal speech  Scaly erythematous rash in the inguinal folds Scattered erythematous plaques on the upper thighs  ED Results / Procedures / Treatments  Labs (all labs ordered are listed, but only abnormal results are displayed) Labs Reviewed  COMPREHENSIVE METABOLIC PANEL - Abnormal; Notable for the following components:      Result Value   CO2 20 (*)    Glucose, Bld 142 (*)    BUN 31 (*)    Creatinine, Ser 1.25 (*)    All other components within normal limits  SALICYLATE LEVEL - Abnormal; Notable for the following components:   Salicylate Lvl [id number] (*)    All other components within normal limits  ACETAMINOPHEN LEVEL - Abnormal; Notable for the following components:   Acetaminophen (Tylenol), Serum <10 (*)    All other components within normal limits  ETHANOL  CBC  URINE DRUG SCREEN, QUALITATIVE ([facility] ONLY)     EKG     RADIOLOGY    PROCEDURES:  Critical Care performed: No     MEDICATIONS ORDERED IN ED: Medications - No data to display   IMPRESSION / MDM /  [place] / ED COURSE  I reviewed the triage vital signs and the nursing notes.                              Patient's presentation is most consistent with exacerbation of chronic illness.  Differential diagnosis includes, but is not limited to, decompensated schizophrenia, malingering, substance-induced psychosis  Patient is a 47 year old male with underlying schizophrenia presents because of delusions and hallucinations.  He is fixated on demons that he is seeing and fighting a battle that he cannot when.  Just seen in the ED last night for SI and was cleared by psychiatry.  He is denying SI currently.  Denies HI.  Patient is calm and cooperative does not appear to be reacting to internal stimuli.  Vital signs are reassuring.  Rash was noticed by nursing when patient was stressing out.  I examined patient's groin area he does have what appears to be jock itch.  Will write for clotrimazole.  Also has some scattered erythematous plaques in the upper thighs that are itchy.  Could be fungal versus cellulitis versus allergic.  Given the itching and less inclined to think it is infection.  Patient's labs overall reassuring.  Renal function is near baseline.  Patient was seen by psychiatry and cleared.  Will discharge with prescription for clotrimazole.       FINAL CLINICAL IMPRESSION(S) / ED DIAGNOSES   Final diagnoses:  Schizophrenia, unspecified type ([place])  Tinea cruris     Rx / DC Orders   ED Discharge Orders          Ordered    clotrimazole (CLOTRIMAZOLE ANTI-FUNGAL) 1 % cream  2 times daily        09/07/22 2304             Note:  This document was prepared using Dragon voice recognition software and may include unintentional dictation errors.   Rada Hay, MD 09/07/22 [phone]

## 2022-09-07 NOTE — ED Notes (Addendum)
Left message with legal guardian to call back for discharge. No after hour number provided on voicemail of legal guardian. Patient going back to original group home.

## 2022-09-07 NOTE — ED Triage Notes (Signed)
Pt noted to have multiple reddened hive like areas to inside of upper thighs. Pt reports some area draining yellow like liquid and are constantly itchy.  No similar areas noted to other areas of body.
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
## 2022-09-07 NOTE — ED Notes (Signed)
Notified group home of ready for discharge status. Group home caregiver stated she would have to call owner of group home and call us back.
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
## 2022-09-07 NOTE — Discharge Instructions (Addendum)
Please apply the clotrimazole lotion to your groin area.  Please continue to take your medications as prescribed.

## 2022-09-07 NOTE — ED Triage Notes (Addendum)
Ambulatory to triage, from [facility]. Pt states " I feel like Im fighting a spiritual battle and I just can't win. Im just tired." Pt denies specific SI at this time, states " we're all going to die at some point." When pt asked if has HI, he states " No not really, unless they piss me off bad enough. When you wrestle with the spirits it takes a toll on you."  Pt endorses intermittent A/V hallucinations but denies at current time.

## 2022-09-07 NOTE — ED Notes (Signed)
Pt dressed out into hospital provided psych scrubs. Pts belongings placed into four separate bags and labeled with the correct identification labels on each bag. Pt denied having anything he wanted locked up with Security stating "all junk and clothes and knick-knacks and stuff".   Pts belongings include: Black Leisure centre manager Black belt Blue flannel [employer] Black socks Purple socks Blue jeans (two) Green Dealer belt [place] boxers Pearline Cables beanie Black duffel bag Green duffle book bag Variety of 8 rubber bracelets   [place] speckled glasses kept with pt

## 2022-10-19 ENCOUNTER — Encounter (HOSPITAL_COMMUNITY): Payer: Self-pay

## 2022-11-07 NOTE — Progress Notes (Signed)
Patient presented for psychiatric evaluation. ETOH level/UDS is a routine part of work-up as it is requested by inpatient facilities prior to placement.

## 2023-09-10 ENCOUNTER — Emergency Department
Admission: EM | Admit: 2023-09-10 | Discharge: 2023-10-08 | Disposition: A | Discharge status: Home / Self Care | Attending: Emergency Medicine | Admitting: Emergency Medicine

## 2023-09-10 ENCOUNTER — Other Ambulatory Visit: Payer: Self-pay

## 2023-09-10 DIAGNOSIS — F411 Generalized anxiety disorder: Secondary | ICD-10-CM

## 2023-09-10 DIAGNOSIS — F419 Anxiety disorder, unspecified: Secondary | ICD-10-CM

## 2023-09-10 DIAGNOSIS — E119 Type 2 diabetes mellitus without complications: Secondary | ICD-10-CM | POA: Insufficient documentation

## 2023-09-10 DIAGNOSIS — Z79899 Other long term (current) drug therapy: Secondary | ICD-10-CM | POA: Diagnosis not present

## 2023-09-10 DIAGNOSIS — I10 Essential (primary) hypertension: Secondary | ICD-10-CM | POA: Diagnosis not present

## 2023-09-10 DIAGNOSIS — R45850 Homicidal ideations: Secondary | ICD-10-CM | POA: Diagnosis not present

## 2023-09-10 DIAGNOSIS — F259 Schizoaffective disorder, unspecified: Secondary | ICD-10-CM | POA: Diagnosis not present

## 2023-09-10 LAB — COMPREHENSIVE METABOLIC PANEL WITH GFR
ALT: 20 U/L (ref 0–44)
AST: 20 U/L (ref 15–41)
Albumin: 4.3 g/dL (ref 3.5–5.0)
Alkaline Phosphatase: 64 U/L (ref 38–126)
Anion gap: 8 (ref 5–15)
BUN: 20 mg/dL (ref 6–20)
CO2: 21 mmol/L — ABNORMAL LOW (ref 22–32)
Calcium: 9.3 mg/dL (ref 8.9–10.3)
Chloride: 111 mmol/L (ref 98–111)
Creatinine, Ser: 1.09 mg/dL (ref 0.61–1.24)
GFR, Estimated: >60 mL/min (ref >60)
Glucose, Bld: 163 mg/dL — ABNORMAL HIGH (ref 70–99)
Potassium: 4 mmol/L (ref 3.5–5.1)
Sodium: 140 mmol/L (ref 135–145)
Total Bilirubin: 0.5 mg/dL (ref 0.0–1.2)
Total Protein: 6.6 g/dL (ref 6.5–8.1)

## 2023-09-10 LAB — URINE DRUG SCREEN, QUALITATIVE (ARMC ONLY)
Amphetamines, Ur Screen: NOT DETECTED
Barbiturates, Ur Screen: NOT DETECTED
Benzodiazepine, Ur Scrn: NOT DETECTED
Cannabinoid 50 Ng, Ur ~~LOC~~: NOT DETECTED
Cocaine Metabolite,Ur ~~LOC~~: NOT DETECTED
MDMA (Ecstasy)Ur Screen: NOT DETECTED
Methadone Scn, Ur: NOT DETECTED
Opiate, Ur Screen: NOT DETECTED
Phencyclidine (PCP) Ur S: NOT DETECTED
Tricyclic, Ur Screen: NOT DETECTED

## 2023-09-10 LAB — ACETAMINOPHEN LEVEL: Acetaminophen (Tylenol), Serum: <10 ug/mL — ABNORMAL LOW (ref 10–30)

## 2023-09-10 LAB — CBC
HCT: 39.4 % (ref 39.0–52.0)
Hemoglobin: 13.6 g/dL (ref 13.0–17.0)
MCH: 32.1 pg (ref 26.0–34.0)
MCHC: 34.5 g/dL (ref 30.0–36.0)
MCV: 92.9 fL (ref 80.0–100.0)
Platelets: 251 10*3/uL (ref 150–400)
RBC: 4.24 MIL/uL (ref 4.22–5.81)
RDW: 12.3 % (ref 11.5–15.5)
WBC: 5.4 10*3/uL (ref 4.0–10.5)
nRBC: 0 % (ref 0.0–0.2)

## 2023-09-10 LAB — SALICYLATE LEVEL: Salicylate Lvl: <7 mg/dL — ABNORMAL LOW (ref 7.0–30.0)

## 2023-09-10 LAB — ETHANOL: Alcohol, Ethyl (B): <10 mg/dL (ref <10)

## 2023-09-10 NOTE — ED Notes (Signed)
 Pt doing interviews with TTS
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
## 2023-09-10 NOTE — ED Notes (Signed)
 This RN introduced self to pt. Pt has no SI/HI thoughts at this time. Pt declined the need of anything. Sated he just wanted to rest.
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
## 2023-09-10 NOTE — ED Notes (Addendum)
 Pt dressed into paper scrubs by this RN and Nettie Elm NT Black socks Red shoes Black jeans Black shirt 6 bracelets of various colors Orange sweater Black belt Boxers Red lighter

## 2023-09-11 ENCOUNTER — Encounter: Payer: Self-pay | Admitting: Psychiatric/Mental Health

## 2023-09-11 DIAGNOSIS — F259 Schizoaffective disorder, unspecified: Secondary | ICD-10-CM | POA: Diagnosis not present

## 2023-09-11 DIAGNOSIS — F411 Generalized anxiety disorder: Secondary | ICD-10-CM

## 2023-09-11 MED ORDER — CLONAZEPAM 0.25 MG PO TBDP
0.2500 mg | ORAL_TABLET | Freq: Two times a day (BID) | ORAL | Status: DC | PRN
  Administered 2023-09-11 – 2023-10-07 (×15): 0.25 mg via ORAL
  Filled 2023-09-11 (×15): qty 1

## 2023-09-11 MED ORDER — LORAZEPAM 0.5 MG PO TABS
0.5000 mg | ORAL_TABLET | Freq: Once | ORAL | Status: AC
  Administered 2023-09-11: 0.5 mg via ORAL
  Filled 2023-09-11: qty 1

## 2023-09-11 NOTE — ED Notes (Signed)
 Group home info in chart is not the current home pt is at. Unable to obtain any infor about current group home, "[facility]".

## 2023-09-11 NOTE — ED Notes (Addendum)
 Pt stating he is anxious, MD aware, new orders, See [facility]

## 2023-09-11 NOTE — ED Provider Notes (Signed)
 Emergency Medicine Observation Re-evaluation Note  Jep Dyas is a 48 y.o. male, seen on rounds today.  Pt initially presented to the ED for complaints of Psychiatric Evaluation  Currently, the patient is is no acute distress. Denies any concerns at this time.  Physical Exam  Blood pressure 117/82, pulse 80, temperature 98.2 F (36.8 C), temperature source Oral, resp. rate 20, SpO2 98%.  Physical Exam: General: No apparent distress Pulm: Normal WOB Neuro: Moving all extremities Psych: Resting comfortably     ED Course / MDM     I have reviewed the labs performed to date as well as medications administered while in observation.  Recent changes in the last 24 hours include: No acute events overnight.  Plan   Current plan: Patient awaiting psychiatric disposition. Patient is not under full IVC at this time.    Matthias Bogus, Layla Maw, DO 09/11/23 [phone]

## 2023-09-11 NOTE — ED Notes (Signed)
 Pt on tele psych call with neurologist.

## 2023-09-11 NOTE — BH Assessment (Signed)
 Writer attempted to contact [facility] [phone]; however, there was no answer/ability to leave a voicemail.
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
## 2023-09-11 NOTE — ED Notes (Signed)
 Patient is vol pending TOC consult

## 2023-09-11 NOTE — BH Assessment (Signed)
 Per psych provider, pt has been psych cleared and referral will be made to [facility].

## 2023-09-12 DIAGNOSIS — F411 Generalized anxiety disorder: Secondary | ICD-10-CM | POA: Diagnosis not present

## 2023-09-12 LAB — HEMOGLOBIN A1C
Hgb A1c MFr Bld: 4.9 % (ref 4.8–5.6)
Mean Plasma Glucose: 93.93 mg/dL

## 2023-09-12 LAB — CBG MONITORING, ED
Glucose-Capillary: 130 mg/dL — ABNORMAL HIGH (ref 70–99)
Glucose-Capillary: 130 mg/dL — ABNORMAL HIGH (ref 70–99)
Glucose-Capillary: 180 mg/dL — ABNORMAL HIGH (ref 70–99)
Glucose-Capillary: 125 mg/dL — ABNORMAL HIGH (ref 70–99)

## 2023-09-12 MED ORDER — SERTRALINE HCL 100 MG PO TABS
100.0000 mg | ORAL_TABLET | Freq: Every day | ORAL | Status: DC
  Administered 2023-09-12 – 2023-10-08 (×27): 100 mg via ORAL
  Filled 2023-09-12 (×28): qty 1

## 2023-09-12 MED ORDER — METFORMIN HCL 500 MG PO TABS
1000.0000 mg | ORAL_TABLET | Freq: Two times a day (BID) | ORAL | Status: DC
Start: 2023-09-12 — End: 2023-10-08
  Administered 2023-09-12 – 2023-10-08 (×52): 1000 mg via ORAL
  Filled 2023-09-12 (×50): qty 2

## 2023-09-12 MED ORDER — LITHIUM CARBONATE ER 450 MG PO TBCR
450.0000 mg | EXTENDED_RELEASE_TABLET | Freq: Every day | ORAL | Status: DC
  Administered 2023-09-12 – 2023-10-07 (×26): 450 mg via ORAL
  Filled 2023-09-12 (×26): qty 1

## 2023-09-12 MED ORDER — OLANZAPINE 10 MG PO TABS
20.0000 mg | ORAL_TABLET | Freq: Every day | ORAL | Status: DC
  Administered 2023-09-12 – 2023-10-07 (×26): 20 mg via ORAL
  Filled 2023-09-12 (×26): qty 2

## 2023-09-12 MED ORDER — INSULIN GLARGINE-YFGN 100 UNIT/ML ~~LOC~~ SOLN
10.0000 [IU] | Freq: Two times a day (BID) | SUBCUTANEOUS | Status: DC
  Filled 2023-09-12: qty 0.1

## 2023-09-12 MED ORDER — SIMVASTATIN 40 MG PO TABS
40.0000 mg | ORAL_TABLET | Freq: Every day | ORAL | Status: DC
  Administered 2023-09-12 – 2023-10-07 (×26): 40 mg via ORAL
  Filled 2023-09-12 (×2): qty 1
  Filled 2023-09-12: qty 4
  Filled 2023-09-12 (×2): qty 1
  Filled 2023-09-12: qty 4
  Filled 2023-09-12: qty 1
  Filled 2023-09-12: qty 4
  Filled 2023-09-12: qty 1
  Filled 2023-09-12: qty 4
  Filled 2023-09-12: qty 1
  Filled 2023-09-12: qty 4
  Filled 2023-09-12 (×3): qty 1
  Filled 2023-09-12: qty 4
  Filled 2023-09-12 (×7): qty 1
  Filled 2023-09-12: qty 4
  Filled 2023-09-12: qty 1
  Filled 2023-09-12: qty 4
  Filled 2023-09-12 (×2): qty 1
  Filled 2023-09-12: qty 4
  Filled 2023-09-12 (×7): qty 1

## 2023-09-12 MED ORDER — LORAZEPAM 1 MG PO TABS
1.0000 mg | ORAL_TABLET | Freq: Four times a day (QID) | ORAL | Status: DC | PRN
  Administered 2023-09-12 – 2023-10-06 (×30): 1 mg via ORAL
  Filled 2023-09-12 (×30): qty 1

## 2023-09-12 MED ORDER — LISINOPRIL 5 MG PO TABS
2.5000 mg | ORAL_TABLET | Freq: Every day | ORAL | Status: DC
  Administered 2023-09-12 – 2023-09-22 (×10): 2.5 mg via ORAL
  Filled 2023-09-12 (×12): qty 1

## 2023-09-12 MED ORDER — PROPRANOLOL HCL 10 MG PO TABS
20.0000 mg | ORAL_TABLET | Freq: Every day | ORAL | Status: DC
  Administered 2023-09-12 – 2023-10-07 (×23): 20 mg via ORAL
  Filled 2023-09-12 (×26): qty 2

## 2023-09-12 MED ORDER — INSULIN ASPART 100 UNIT/ML IJ SOLN
4.0000 [IU] | Freq: Three times a day (TID) | INTRAMUSCULAR | Status: DC
Start: 2023-09-12 — End: 2023-09-14
  Administered 2023-09-12 – 2023-09-13 (×3): 4 [IU] via SUBCUTANEOUS
  Filled 2023-09-12 (×2): qty 1

## 2023-09-12 MED ORDER — INSULIN ASPART 100 UNIT/ML IJ SOLN
0.0000 [IU] | Freq: Three times a day (TID) | INTRAMUSCULAR | Status: DC
  Administered 2023-09-12: 3 [IU] via SUBCUTANEOUS
  Administered 2023-09-12: 2 [IU] via SUBCUTANEOUS
  Filled 2023-09-12 (×2): qty 1

## 2023-09-12 MED ORDER — INSULIN ASPART 100 UNIT/ML IJ SOLN: 0.0000 [IU] | Freq: Every day | INTRAMUSCULAR | Status: DC

## 2023-09-12 NOTE — ED Notes (Signed)
 Patient is in the dayroom, Patient threw a deck of cards at the door.

## 2023-09-12 NOTE — ED Provider Notes (Signed)
 Emergency Medicine Observation Re-evaluation Note  Lance [name] is a 48 y.o. male, seen on rounds today.  Pt initially presented to the ED for complaints of Psychiatric Evaluation  Currently, the patient is resting comfortably.  Physical Exam  BP 110/70   Pulse 69   Temp 98.2 F (36.8 C) (Oral)   Resp 19   SpO2 96%  General: No acute distress Cardiac: Well-perfused extremities Lungs: No respiratory distress Psych: Appropriate mood and affect  ED Course / MDM  EKG:   I have reviewed the labs performed to date as well as medications administered while in observation.  Recent changes in the last 24 hours include none.  Plan  Current plan is for placement.   Merwyn Katos, MD 09/12/23 [id number]

## 2023-09-12 NOTE — ED Notes (Signed)
Lunch tray and beverage provided 

## 2023-09-12 NOTE — ED Notes (Signed)
 Pt provided with shower supplies, clean scrubs and clean non-slip socks.

## 2023-09-12 NOTE — ED Notes (Signed)
 Patient requested afternoon snack, bag of pretzels and yougurt provided; Pt tolerated well and waste disposed of properly

## 2023-09-12 NOTE — ED Notes (Signed)
 Vol Pending TOC Placement

## 2023-09-12 NOTE — ED Notes (Addendum)
 Patient came to the door asking for his night time medicine. This NT told patient that she would ask the nurse. Patient stated "if I don't get my night time medicine, I will show my ass".

## 2023-09-12 NOTE — ED Notes (Signed)
 Hospital meal provided, pt tolerated w/o complaints.  Waste discarded appropriately.

## 2023-09-13 ENCOUNTER — Inpatient Hospital Stay

## 2023-09-13 ENCOUNTER — Inpatient Hospital Stay: Admitting: Internal Medicine

## 2023-09-13 DIAGNOSIS — F411 Generalized anxiety disorder: Secondary | ICD-10-CM | POA: Diagnosis not present

## 2023-09-13 LAB — CBG MONITORING, ED
Glucose-Capillary: 141 mg/dL — ABNORMAL HIGH (ref 70–99)
Glucose-Capillary: 71 mg/dL (ref 70–99)
Glucose-Capillary: 99 mg/dL (ref 70–99)
Glucose-Capillary: 151 mg/dL — ABNORMAL HIGH (ref 70–99)

## 2023-09-13 NOTE — ED Notes (Signed)
 VOL/Pending TOC Placement

## 2023-09-13 NOTE — TOC Initial Note (Signed)
 Transition of Care [facility]) - Initial/Assessment Note    Patient Details  Name: Lance [name] MRN: [id number] Date of Birth: [birth year]  [facility] CM/SW Contact:    Elberta Fortis, RN Phone Number: 09/13/2023, 4:40 PM  Clinical Narrative:                 Met with pt on the [facility] unit in the ED. He was very pleasant and wishes to be Dc'd to a different group home and asked about going to the inpatient psych unit at this hospital. He has been cleared by psych and will need placement. He has a legal guardian, a friend, and a niece. TOC to follow and look for placement at DC.   Expected Discharge Plan: Group Home (psychiatric group home) Barriers to Discharge: ED Unsafe disposition   Patient Goals and CMS Choice Patient states their goals for this hospitalization and ongoing recovery are:: "Go to another group home where I fit in better"          Expected Discharge Plan and Services   Discharge Planning Services: CM Consult   Living arrangements for the past 2 months: Group Home                                      Prior Living Arrangements/Services Living arrangements for the past 2 months: Group Home Lives with:: Facility Resident Patient language and need for interpreter reviewed:: Yes Do you feel safe going back to the place where you live?: No   He needs another group home  Need for Family Participation in Patient Care: Yes (Comment) Care giver support system in place?: No (comment)   Criminal Activity/Legal Involvement Pertinent to Current Situation/Hospitalization: No - Comment as needed  Activities of Daily Living      Permission Sought/Granted Permission sought to share information with : Guardian                Emotional Assessment Appearance:: Well-Groomed Attitude/Demeanor/Rapport: Gracious Affect (typically observed): Accepting, Appropriate, Calm, Anxious, Pleasant Orientation: : Oriented to Self, Oriented to Place,  Oriented to  Time, Oriented to Situation Alcohol / Substance Use: Not Applicable Psych Involvement: Yes (comment) (psych has signed off)  Admission diagnosis:  psych evaluation Patient Active Problem List   Diagnosis Date Noted   GAD (generalized anxiety disorder) 09/11/2023   Schizophrenia, 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HCC) 08/19/2022   Odontogenic infection of jaw 06/09/2022   Cellulitis of face 06/06/2022   AKI (acute kidney injury) (HCC) 06/06/2022   Boxer's metacarpal fracture, neck, closed 04/27/2022   Psychosis (HCC) 04/27/2022   Lupus anticoagulant syndrome (HCC) 03/05/2022   Insomnia    Hallucinations    Suicidal ideation    Alcohol abuse 04/12/2018   Acute psychosis (HCC) 10/15/2017   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 [place] [address] Phone: [phone] Fax: [phone]     Social Drivers of Health (SDOH) Social History: SDOH Screenings   Food Insecurity: No Food Insecurity (08/19/2022)  Recent Concern: Food Insecurity - Food Insecurity Present (06/17/2022)  Housing: Low Risk  (08/19/2022)  Recent  Concern: Housing - High Risk (06/17/2022)  Transportation Needs: Unmet Transportation Needs (08/19/2022)  Utilities: Not At Risk (08/19/2022)  Alcohol Screen: Low Risk  (08/19/2022)  Depression (PHQ2-9): Low Risk  (03/20/2021)  Financial Resource Strain: Low Risk  (03/20/2021)  Physical Activity: Insufficiently Active (03/20/2021)  Social Connections: Socially Isolated (03/20/2021)  Stress: No Stress Concern Present (03/20/2021)  Tobacco Use: High Risk (09/11/2023)   SDOH Interventions:      Readmission Risk Interventions     No data to display

## 2023-09-13 NOTE — ED Provider Notes (Signed)
 Emergency Medicine Observation Re-evaluation Note  Lance [name] is a 48 y.o. male, seen on rounds today.  Pt initially presented to the ED for complaints of Psychiatric Evaluation    Physical Exam  BP 114/68   Pulse 63   Temp (!) 97.1 F (36.2 C) (Oral)   Resp 19   SpO2 97%  Physical Exam General: nad  ED Course / MDM  EKG:   I have reviewed the labs performed to date as well as medications administered while in observation.   Plan  Current plan is for psych/toc    Willy Eddy, MD 09/13/23 [phone]

## 2023-09-13 NOTE — ED Notes (Signed)
Reports feeling anxious

## 2023-09-13 NOTE — ED Notes (Signed)
 VOL  TOC  PLACEMENT

## 2023-09-13 NOTE — ED Notes (Signed)
 Pt alone in dayroom pacing and was seen by staff throwing his cup of water on the floor. Pt then loudly knocked on door of nurses station and states he is getting agitated "because of everything going on in here" and asked for a PRN med.

## 2023-09-13 NOTE — ED Notes (Signed)
 Breakfast tray provided with milk in the dayroom

## 2023-09-13 NOTE — ED Notes (Addendum)
 Pt states he ate all his lunch of ham and cheese sub, orange, carrots, and chips. Pt given orange juice X2 due to FSBS of 71.

## 2023-09-13 NOTE — ED Notes (Signed)
 Patient gave snack

## 2023-09-13 NOTE — ED Notes (Signed)
 Shower supplies provided and pt let into shower room by security

## 2023-09-14 DIAGNOSIS — F411 Generalized anxiety disorder: Secondary | ICD-10-CM | POA: Diagnosis not present

## 2023-09-14 LAB — CBG MONITORING, ED
Glucose-Capillary: 107 mg/dL — ABNORMAL HIGH (ref 70–99)
Glucose-Capillary: 102 mg/dL — ABNORMAL HIGH (ref 70–99)
Glucose-Capillary: 150 mg/dL — ABNORMAL HIGH (ref 70–99)

## 2023-09-14 NOTE — ED Provider Notes (Signed)
-----------------------------------------   2:10 PM on 09/14/2023 -----------------------------------------  I received word that the patient is refusing his insulin and states that he does not normally take insulin.  Despite not taking it, his blood glucose has been very appropriate.  I am discontinuing the order since he has both refusing and may not be taking it at baseline anyway   Loleta Rose, MD 09/14/23 1410

## 2023-09-14 NOTE — ED Notes (Signed)
Pt requesting medication for anxiety

## 2023-09-14 NOTE — ED Notes (Signed)
 VOL/TOC Placement
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
## 2023-09-14 NOTE — ED Provider Notes (Signed)
 Emergency Medicine Observation Re-evaluation Note  Lance [name] is a 48 y.o. male, seen on rounds today.  Pt initially presented to the ED for complaints of Psychiatric Evaluation Currently, the patient is no acute distress.  Physical Exam  BP 118/72   Pulse (!) 56   Temp 98 F (36.7 C) (Oral)   Resp 16   SpO2 98%  Physical Exam General: No acute distress Cardiac: Well-perfused Lungs: No respiratory distress Psych: No acute distress  ED Course / MDM  EKG:   I have reviewed the labs performed to date as well as medications administered while in observation.  No recent changes in the last 24 hours.  Plan  Current plan is for psychiatric disposition recommendations.    Marinell Blight, MD 09/14/23 [phone]

## 2023-09-15 DIAGNOSIS — F411 Generalized anxiety disorder: Secondary | ICD-10-CM | POA: Diagnosis not present

## 2023-09-15 LAB — CBG MONITORING, ED
Glucose-Capillary: 113 mg/dL — ABNORMAL HIGH (ref 70–99)
Glucose-Capillary: 138 mg/dL — ABNORMAL HIGH (ref 70–99)

## 2023-09-15 NOTE — TOC Progression Note (Signed)
 Transition of Care [facility]) - Progression Note    Patient Details  Name: Lance [name] MRN: [id number] Date of Birth: 1976-03-04  Transition of Care [facility]) CM/SW Contact  Lance Fortis, RN Phone Number: 09/15/2023, 3:54 PM  Clinical Narrative:    Lance [name] called with updates. She talked to his probation officer and pt needs to remain in the count and in a licensed facility. She's spoken with the group home [facility] and they may accept him. She'd like me to fax her the [facility] tomorrow to [phone]. TOC to continue to follow.    Expected Discharge Plan: Group Home (psychiatric group home) Barriers to Discharge: ED Unsafe disposition  Expected Discharge Plan and Services   Discharge Planning Services: CM Consult   Living arrangements for the past 2 months: Group Home                                       Social Determinants of Health (SDOH) Interventions SDOH Screenings   Food Insecurity: No Food Insecurity (08/19/2022)  Recent Concern: Food Insecurity - Food Insecurity Present (06/17/2022)  Housing: Low Risk  (08/19/2022)  Recent Concern: Housing - High Risk (06/17/2022)  Transportation Needs: Unmet Transportation Needs (08/19/2022)  Utilities: Not At Risk (08/19/2022)  Alcohol Screen: Low Risk  (08/19/2022)  Depression (PHQ2-9): Low Risk  (03/20/2021)  Financial Resource Strain: Low Risk  (03/20/2021)  Physical Activity: Insufficiently Active (03/20/2021)  Social Connections: Socially Isolated (03/20/2021)  Stress: No Stress Concern Present (03/20/2021)  Tobacco Use: High Risk (09/11/2023)    Readmission Risk Interventions     No data to display

## 2023-09-15 NOTE — ED Notes (Signed)
 Vol toc placement

## 2023-09-15 NOTE — ED Notes (Signed)
 During nursing assessment Mr Nishikawa was A/Ox 4 .  He  stated that he is not currently have thoughts or feelings of SI/HI.  Mr Kozar reported that he is not currently having auditory or visual hallucinations.  Pt affect is congruent with situation , eye contact is intermittent , speech is normal rate and volume  with appropriate content and verbiage noted.  Staff addressed any feelings or concerns that have been brought up.  Medications were administered as ordered. Continue to monitor patient as ordered for any changes in behaviors and for continued safety.

## 2023-09-15 NOTE — ED Notes (Signed)
Vol/Toc/Pending Placement

## 2023-09-15 NOTE — ED Provider Notes (Signed)
 Emergency Medicine Observation Re-evaluation Note  Lance [name] is a 48 y.o. male, seen on rounds today.  Pt initially presented to the ED for complaints of Psychiatric Evaluation Currently, the patient is resting.  Physical Exam  BP 104/73 (BP Location: Right Arm)   Pulse (!) 56   Temp 97.8 F (36.6 C) (Oral)   Resp 16   SpO2 100%  Physical Exam .Gen:  No acute distress Resp:  Breathing easily and comfortably, no accessory muscle usage Neuro:  Moving all four extremities, no gross focal neuro deficits Psych:  Resting currently, calm when awake   ED Course / MDM  EKG:   I have reviewed the labs performed to date as well as medications administered while in observation.  Recent changes in the last 24 hours include no acute events.  Plan  Current plan is for psyc dispo.    Claybon Jabs, MD 09/15/23 [phone]

## 2023-09-15 NOTE — ED Notes (Signed)
 Pt reported to staff that he is having an increased anxiety and requested PRN anxiety medication.  Staff provided medication w/o incident.  Cont to monitor as ordered

## 2023-09-16 DIAGNOSIS — F411 Generalized anxiety disorder: Secondary | ICD-10-CM | POA: Diagnosis not present

## 2023-09-16 LAB — CBG MONITORING, ED
Glucose-Capillary: 127 mg/dL — ABNORMAL HIGH (ref 70–99)
Glucose-Capillary: 107 mg/dL — ABNORMAL HIGH (ref 70–99)

## 2023-09-16 NOTE — ED Provider Notes (Signed)
 Emergency Medicine Observation Re-evaluation Note  Lance [name] is a 48 y.o. male, seen on rounds today.  Pt initially presented to the ED for complaints of Psychiatric Evaluation Currently, the patient is resting comfortably in no acute distress.  Physical Exam  BP 99/60   Pulse 73   Temp 99.1 F (37.3 C) (Oral)   Resp 15   SpO2 97%  Physical Exam General: No acute distress Cardiac: Well-perfused Lungs: No respiratory distress Psych: Not agitated  ED Course / MDM  EKG:   I have reviewed the labs performed to date as well as medications administered while in observation.  Recent changes in the last 24 hours include .  Plan  Current plan is pending psychiatric disposition recommendations.    Marinell Blight, MD 09/16/23 [phone]

## 2023-09-16 NOTE — TOC Progression Note (Signed)
 Transition of Care [facility]) - Progression Note    Patient Details  Name: Lance [name] MRN: [id number] Date of Birth: 1975-10-16  Transition of Care [facility] CM/SW Contact  Elberta Fortis, RN Phone Number: 09/16/2023, 10:26 AM  Clinical Narrative:    FL2 filled out and faxed to guardian [name] @ F# [phone]. She's trying to place [facility] [place]. Also, checked on pt in the [facility] unit and let him know we're working on his discharge. He's very happy about this and per the RN hasn't had any negative behaviors. TOC to continue to follow.    Expected Discharge Plan: Group Home (psychiatric group home) Barriers to Discharge: ED Unsafe disposition  Expected Discharge Plan and Services   Discharge Planning Services: CM Consult   Living arrangements for the past 2 months: Group Home                                       Social Determinants of Health (SDOH) Interventions SDOH Screenings   Food Insecurity: No Food Insecurity (08/19/2022)  Recent Concern: Food Insecurity - Food Insecurity Present (06/17/2022)  Housing: Low Risk  (08/19/2022)  Recent Concern: Housing - High Risk (06/17/2022)  Transportation Needs: Unmet Transportation Needs (08/19/2022)  Utilities: Not At Risk (08/19/2022)  Alcohol Screen: Low Risk  (08/19/2022)  Depression (PHQ2-9): Low Risk  (03/20/2021)  Financial Resource Strain: Low Risk  (03/20/2021)  Physical Activity: Insufficiently Active (03/20/2021)  Social Connections: Socially Isolated (03/20/2021)  Stress: No Stress Concern Present (03/20/2021)  Tobacco Use: High Risk (09/11/2023)    Readmission Risk Interventions     No data to display

## 2023-09-16 NOTE — ED Notes (Signed)
 Pt provided with dinner tray. Pt sitting at table eating.

## 2023-09-16 NOTE — ED Notes (Signed)
vol/toc placement.. 

## 2023-09-16 NOTE — NC FL2 (Signed)
 [place] MEDICAID FL2 LEVEL OF CARE FORM     IDENTIFICATION  Patient Name: Lance [name] Birthdate: April 06, 1976 Sex: male Admission Date (Current Location): 09/10/2023  [place] and [place] Number:  [name] [id number] Facility and Address:  [facility], [address] [place], [place] [address]      Provider Number:    Attending Physician Name and Address:  No att. providers found  Relative Name and Phone Number:  [name] (Legal Guardian)  [phone]    Current Level of Care: Hospital Recommended Level of Care: Other (Comment) (behavioral health group home) Prior Approval Number:    Date Approved/Denied:   PASRR Number:    Discharge Plan: Other (Comment) (group home)    Current Diagnoses: Patient Active Problem List   Diagnosis Date Noted   GAD (generalized anxiety disorder) 09/11/2023   Schizophrenia, 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HCC) 08/19/2022   Odontogenic infection of jaw 06/09/2022   Cellulitis of face 06/06/2022   AKI (acute kidney injury) (HCC) 06/06/2022   Boxer's metacarpal fracture, neck, closed 04/27/2022   Psychosis (HCC) 04/27/2022   Lupus anticoagulant syndrome (HCC) 03/05/2022   Insomnia    Hallucinations    Suicidal ideation    Alcohol abuse 04/12/2018   Acute psychosis (HCC) 10/15/2017   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   Tobacco use disorder 09/01/2016   Schizoaffective disorder, bipolar type (HCC) 05/22/2016   Diabetes mellitus without complication (HCC) 05/21/2016   Hyperlipidemia 05/21/2016   Chronic anticoagulation 05/21/2016   Hypertension 09/25/2015    Orientation RESPIRATION BLADDER Height & Weight     Self, Time, Situation, Place  Normal Continent Weight:   Height:     BEHAVIORAL SYMPTOMS/MOOD NEUROLOGICAL BOWEL NUTRITION STATUS   (No negative behaviors while in the hospital.)   Continent Diet (Carb controlled)  AMBULATORY STATUS COMMUNICATION OF NEEDS Skin   Independent  Verbally Normal                       Personal Care Assistance Level of Assistance   (Independent with ADL's)           Functional Limitations Info             SPECIAL CARE FACTORS FREQUENCY  Blood pressure Blood Pressure Frequency: Once daily                  Contractures Contractures Info: Not present    Additional Factors Info  Allergies   Allergies Info: Penicillin Psychotropic Info: Takes clonazepam or ativan, lithium carbonate, zyprexa, and zoloft         Current Medications (09/16/2023):  This is the current hospital active medication list Current Facility-Administered Medications  Medication Dose Route Frequency Provider Last Rate Last Admin   clonazePAM (KLONOPIN) disintegrating tablet 0.25 mg  0.25 mg Oral BID PRN Hunt, Katlin E, NP   0.25 mg at 09/15/23 0834   lisinopril (ZESTRIL) tablet 2.5 mg  2.5 mg Oral Daily Pilar Jarvis, MD   2.5 mg at 09/16/23 0902   lithium carbonate (ESKALITH) ER tablet 450 mg  450 mg Oral QHS Pilar Jarvis, MD   450 mg at 09/15/23 2146   LORazepam (ATIVAN) tablet 1 mg  1 mg Oral Q6H PRN Phineas Semen, MD   1 mg at 09/16/23 0902   metFORMIN (GLUCOPHAGE) tablet 1,000 mg  1,000 mg Oral BID WC Pilar Jarvis, MD   1,000 mg at 09/16/23 0902   OLANZapine (ZYPREXA) tablet 20 mg  20 mg Oral QHS Pilar Jarvis, MD  20 mg at 09/15/23 2144   propranolol (INDERAL) tablet 20 mg  20 mg Oral QHS Pilar Jarvis, MD   20 mg at 09/13/23 2104   sertraline (ZOLOFT) tablet 100 mg  100 mg Oral Daily Pilar Jarvis, MD   100 mg at 09/16/23 0902   simvastatin (ZOCOR) tablet 40 mg  40 mg Oral q1800 Pilar Jarvis, MD   40 mg at 09/15/23 1755   Current Outpatient Medications  Medication Sig Dispense Refill   lisinopril (ZESTRIL) 2.5 MG tablet Take 1 tablet (2.5 mg total) by mouth daily. 30 tablet 1   lithium carbonate (ESKALITH) 450 MG ER tablet Take 1 tablet (450 mg total) by mouth at bedtime. 30 tablet 1   metFORMIN (GLUCOPHAGE) 1000 MG tablet Take 1 tablet  (1,000 mg total) by mouth 2 (two) times daily with a meal. 60 tablet 1   OLANZapine (ZYPREXA) 20 MG tablet Take 1 tablet (20 mg total) by mouth at bedtime. 30 tablet 1   propranolol (INDERAL) 20 MG tablet Take 1 tablet (20 mg total) by mouth at bedtime. 30 tablet 1   sertraline (ZOLOFT) 100 MG tablet Take 1 tablet (100 mg total) by mouth daily. 30 tablet 1   simvastatin (ZOCOR) 40 MG tablet Take 1 tablet (40 mg total) by mouth daily at 6 PM. 30 tablet 1   albuterol (VENTOLIN HFA) 108 (90 Base) MCG/ACT inhaler Inhale 2 puffs into the lungs every 6 (six) hours as needed for shortness of breath or wheezing. (Patient not taking: Reported on 09/11/2023) 18 g 1   apixaban (ELIQUIS) 5 MG TABS tablet Take 1 tablet (5 mg total) by mouth 2 (two) times daily. (Patient not taking: Reported on 09/11/2023) 60 tablet 1   blood glucose meter kit and supplies Dispense based on patient and insurance preference. Use up to four times daily as directed. (FOR ICD-10 E10.9, E11.9). (Patient not taking: Reported on 09/11/2023) 1 each 6   clonazePAM (KLONOPIN) 0.25 MG disintegrating tablet Take 1 tablet (0.25 mg total) by mouth 2 (two) times daily as needed (anxiety). (Patient not taking: Reported on 09/11/2023) 60 tablet 1   clotrimazole (CLOTRIMAZOLE ANTI-FUNGAL) 1 % cream Apply 1 Application topically 2 (two) times daily. (Patient not taking: Reported on 09/11/2023) 30 g 0   cloZAPine (CLOZARIL) 50 MG tablet Take 5 tablets (250 mg total) by mouth at bedtime. (Patient not taking: Reported on 09/11/2023) 150 tablet 0   fluticasone-salmeterol (ADVAIR) 250-50 MCG/ACT AEPB Inhale 1 puff into the lungs 2 (two) times daily. (Patient not taking: Reported on 09/11/2023) 60 each 1   glucose blood (FORA V30A BLOOD GLUCOSE TEST) test strip CHECK BLOOD SUGAR TWICE DAILY. (Patient not taking: Reported on 09/11/2023) 100 strip 3   insulin detemir (LEVEMIR) 100 UNIT/ML injection Inject 0.1 mLs (10 Units total) into the skin 2 (two) times daily.  (Patient not taking: Reported on 09/11/2023) 10 mL 1   nicotine polacrilex (NICORETTE) 2 MG gum Take 1 each (2 mg total) by mouth as needed for smoking cessation. (Patient not taking: Reported on 09/11/2023) 100 tablet 1   NOVOFINE AUTOCOVER PEN NEEDLE 30G X 8 MM MISC USE WITH LEVEMIR TWICE DAILY. (Patient not taking: Reported on 09/11/2023) 100 each 11   traZODone (DESYREL) 100 MG tablet Take 1 tablet (100 mg total) by mouth at bedtime. (Patient not taking: Reported on 09/11/2023) 30 tablet 1     Discharge Medications: Please see discharge summary for a list of discharge medications.  Relevant Imaging Results:  Relevant Lab  Results:   Additional Information ss# [id number]  Elberta Fortis, RN

## 2023-09-16 NOTE — NC FL2 (Signed)
 [place] MEDICAID FL2 LEVEL OF CARE FORM     IDENTIFICATION  Patient Name: Lance [name] Birthdate: 1975/08/18 Sex: male Admission Date (Current Location): 09/10/2023  [place] and [place] Number:  [name] [id number] Facility and Address:  [facility], [address] [place], [place] [address]      Provider Number:    Attending Physician Name and Address:  No att. providers found  Relative Name and Phone Number:  [name] (Legal Guardian)  [phone]    Current Level of Care: Hospital Recommended Level of Care: Other (Comment) (behavioral health group home) Prior Approval Number:    Date Approved/Denied:   PASRR Number:    Discharge Plan: Other (Comment) (group home)    Current Diagnoses: Patient Active Problem List   Diagnosis Date Noted   GAD (generalized anxiety disorder) 09/11/2023   Schizophrenia, 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HCC) 08/19/2022   Odontogenic infection of jaw 06/09/2022   Cellulitis of face 06/06/2022   AKI (acute kidney injury) (HCC) 06/06/2022   Boxer's metacarpal fracture, neck, closed 04/27/2022   Psychosis (HCC) 04/27/2022   Lupus anticoagulant syndrome (HCC) 03/05/2022   Insomnia    Hallucinations    Suicidal ideation    Alcohol abuse 04/12/2018   Acute psychosis (HCC) 10/15/2017   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   Tobacco use disorder 09/01/2016   Schizoaffective disorder, bipolar type (HCC) 05/22/2016   Diabetes mellitus without complication (HCC) 05/21/2016   Hyperlipidemia 05/21/2016   Chronic anticoagulation 05/21/2016   Hypertension 09/25/2015    Orientation RESPIRATION BLADDER Height & Weight     Self, Time, Situation, Place  Normal Continent Weight:   Height:     BEHAVIORAL SYMPTOMS/MOOD NEUROLOGICAL BOWEL NUTRITION STATUS   (No negative behaviors while in the hospital.)   Continent Diet (Regular diet, no restrictions)  AMBULATORY STATUS COMMUNICATION OF NEEDS Skin    Independent Verbally Normal                       Personal Care Assistance Level of Assistance   (Independent with ADL's)           Functional Limitations Info             SPECIAL CARE FACTORS FREQUENCY  Blood pressure Blood Pressure Frequency: Once daily                  Contractures Contractures Info: Not present    Additional Factors Info  Allergies   Allergies Info: Penicillin Psychotropic Info: Takes clonazepam or ativan, lithium carbonate, zyprexa, and zoloft         Current Medications (09/16/2023):  This is the current hospital active medication list Current Facility-Administered Medications  Medication Dose Route Frequency Provider Last Rate Last Admin   clonazePAM (KLONOPIN) disintegrating tablet 0.25 mg  0.25 mg Oral BID PRN Hunt, Katlin E, NP   0.25 mg at 09/15/23 0834   lisinopril (ZESTRIL) tablet 2.5 mg  2.5 mg Oral Daily Pilar Jarvis, MD   2.5 mg at 09/15/23 0834   lithium carbonate (ESKALITH) ER tablet 450 mg  450 mg Oral QHS Pilar Jarvis, MD   450 mg at 09/15/23 2146   LORazepam (ATIVAN) tablet 1 mg  1 mg Oral Q6H PRN Phineas Semen, MD   1 mg at 09/16/23 0137   metFORMIN (GLUCOPHAGE) tablet 1,000 mg  1,000 mg Oral BID WC Pilar Jarvis, MD   1,000 mg at 09/15/23 1621   OLANZapine (ZYPREXA) tablet 20 mg  20 mg Oral QHS Pilar Jarvis,  MD   20 mg at 09/15/23 2144   propranolol (INDERAL) tablet 20 mg  20 mg Oral QHS Pilar Jarvis, MD   20 mg at 09/13/23 2104   sertraline (ZOLOFT) tablet 100 mg  100 mg Oral Daily Pilar Jarvis, MD   100 mg at 09/15/23 0834   simvastatin (ZOCOR) tablet 40 mg  40 mg Oral q1800 Pilar Jarvis, MD   40 mg at 09/15/23 1755   Current Outpatient Medications  Medication Sig Dispense Refill   lisinopril (ZESTRIL) 2.5 MG tablet Take 1 tablet (2.5 mg total) by mouth daily. 30 tablet 1   lithium carbonate (ESKALITH) 450 MG ER tablet Take 1 tablet (450 mg total) by mouth at bedtime. 30 tablet 1   metFORMIN (GLUCOPHAGE) 1000 MG tablet  Take 1 tablet (1,000 mg total) by mouth 2 (two) times daily with a meal. 60 tablet 1   OLANZapine (ZYPREXA) 20 MG tablet Take 1 tablet (20 mg total) by mouth at bedtime. 30 tablet 1   propranolol (INDERAL) 20 MG tablet Take 1 tablet (20 mg total) by mouth at bedtime. 30 tablet 1   sertraline (ZOLOFT) 100 MG tablet Take 1 tablet (100 mg total) by mouth daily. 30 tablet 1   simvastatin (ZOCOR) 40 MG tablet Take 1 tablet (40 mg total) by mouth daily at 6 PM. 30 tablet 1   albuterol (VENTOLIN HFA) 108 (90 Base) MCG/ACT inhaler Inhale 2 puffs into the lungs every 6 (six) hours as needed for shortness of breath or wheezing. (Patient not taking: Reported on 09/11/2023) 18 g 1   apixaban (ELIQUIS) 5 MG TABS tablet Take 1 tablet (5 mg total) by mouth 2 (two) times daily. (Patient not taking: Reported on 09/11/2023) 60 tablet 1   blood glucose meter kit and supplies Dispense based on patient and insurance preference. Use up to four times daily as directed. (FOR ICD-10 E10.9, E11.9). (Patient not taking: Reported on 09/11/2023) 1 each 6   clonazePAM (KLONOPIN) 0.25 MG disintegrating tablet Take 1 tablet (0.25 mg total) by mouth 2 (two) times daily as needed (anxiety). (Patient not taking: Reported on 09/11/2023) 60 tablet 1   clotrimazole (CLOTRIMAZOLE ANTI-FUNGAL) 1 % cream Apply 1 Application topically 2 (two) times daily. (Patient not taking: Reported on 09/11/2023) 30 g 0   cloZAPine (CLOZARIL) 50 MG tablet Take 5 tablets (250 mg total) by mouth at bedtime. (Patient not taking: Reported on 09/11/2023) 150 tablet 0   fluticasone-salmeterol (ADVAIR) 250-50 MCG/ACT AEPB Inhale 1 puff into the lungs 2 (two) times daily. (Patient not taking: Reported on 09/11/2023) 60 each 1   glucose blood (FORA V30A BLOOD GLUCOSE TEST) test strip CHECK BLOOD SUGAR TWICE DAILY. (Patient not taking: Reported on 09/11/2023) 100 strip 3   insulin detemir (LEVEMIR) 100 UNIT/ML injection Inject 0.1 mLs (10 Units total) into the skin 2 (two)  times daily. (Patient not taking: Reported on 09/11/2023) 10 mL 1   nicotine polacrilex (NICORETTE) 2 MG gum Take 1 each (2 mg total) by mouth as needed for smoking cessation. (Patient not taking: Reported on 09/11/2023) 100 tablet 1   NOVOFINE AUTOCOVER PEN NEEDLE 30G X 8 MM MISC USE WITH LEVEMIR TWICE DAILY. (Patient not taking: Reported on 09/11/2023) 100 each 11   traZODone (DESYREL) 100 MG tablet Take 1 tablet (100 mg total) by mouth at bedtime. (Patient not taking: Reported on 09/11/2023) 30 tablet 1     Discharge Medications: Please see discharge summary for a list of discharge medications.  Relevant Imaging Results:  Relevant Lab Results:   Additional Information ss# [id number]  Elberta Fortis, RN

## 2023-09-17 DIAGNOSIS — F411 Generalized anxiety disorder: Secondary | ICD-10-CM | POA: Diagnosis not present

## 2023-09-17 LAB — CBG MONITORING, ED
Glucose-Capillary: 115 mg/dL — ABNORMAL HIGH (ref 70–99)
Glucose-Capillary: 105 mg/dL — ABNORMAL HIGH (ref 70–99)
Glucose-Capillary: 128 mg/dL — ABNORMAL HIGH (ref 70–99)

## 2023-09-17 NOTE — ED Notes (Signed)
 A note was left for writer to call the patients primary doctor's office at [phone] to speak with Crystal. Attempted number several times but it won't go through, sounds like a fax number because it "beeps"  instead of "ringing".

## 2023-09-17 NOTE — TOC Progression Note (Signed)
 Transition of Care [facility]) - Progression Note    Patient Details  Name: Lance [name] MRN: [id number] Date of Birth: February 09, 1976  [facility]) CM/SW Contact  Colin Broach, LCSW Phone Number: 09/17/2023, 12:21 PM  Clinical Narrative: Spoke with patient's guardian, [name], who states that she hasn't heard anything back from [facility] yet.  She was able to get him referred back to his ACT Team and [name].  They will be out to see patient to complete their assessments.  She will keep TOC updated whenever she gets information.       Expected Discharge Plan: Group Home (psychiatric group home) Barriers to Discharge: ED Unsafe disposition  Expected Discharge Plan and Services   Discharge Planning Services: CM Consult   Living arrangements for the past 2 months: Group Home                                       Social Determinants of Health (SDOH) Interventions SDOH Screenings   Food Insecurity: No Food Insecurity (08/19/2022)  Recent Concern: Food Insecurity - Food Insecurity Present (06/17/2022)  Housing: Low Risk  (08/19/2022)  Recent Concern: Housing - High Risk (06/17/2022)  Transportation Needs: Unmet Transportation Needs (08/19/2022)  Utilities: Not At Risk (08/19/2022)  Alcohol Screen: Low Risk  (08/19/2022)  Depression (PHQ2-9): Low Risk  (03/20/2021)  Financial Resource Strain: Low Risk  (03/20/2021)  Physical Activity: Insufficiently Active (03/20/2021)  Social Connections: Socially Isolated (03/20/2021)  Stress: No Stress Concern Present (03/20/2021)  Tobacco Use: High Risk (09/11/2023)    Readmission Risk Interventions     No data to display

## 2023-09-17 NOTE — ED Notes (Signed)
 Pt was provided pm snack.

## 2023-09-17 NOTE — ED Provider Notes (Signed)
 Emergency Medicine Observation Re-evaluation Note  Lance [name] is a 48 y.o. male, seen on rounds today.  Pt initially presented to the ED for complaints of Psychiatric Evaluation  Currently, the patient is is no acute distress. Denies any concerns at this time.  Physical Exam  Blood pressure 102/67, pulse 67, temperature 98 F (36.7 C), temperature source Oral, resp. rate 18, SpO2 99%.  Physical Exam: General: No apparent distress Pulm: Normal WOB Neuro: Moving all extremities Psych: Resting comfortably     ED Course / MDM     I have reviewed the labs performed to date as well as medications administered while in observation.  Recent changes in the last 24 hours include: No acute events overnight.  Plan   Current plan: Patient awaiting SW disposition  Patient is not under full IVC at this time.    Corena Herter, MD 09/17/23 [phone]

## 2023-09-17 NOTE — ED Notes (Signed)
 VOL/Pending TOC Placement

## 2023-09-18 DIAGNOSIS — F411 Generalized anxiety disorder: Secondary | ICD-10-CM | POA: Diagnosis not present

## 2023-09-18 NOTE — ED Notes (Signed)
 Pt provided with breakfast tray. Pt sitting up eating

## 2023-09-18 NOTE — Group Note (Deleted)
 Date:  09/18/2023 Time:  9:53 PM  Group Topic/Focus:  Wrap-Up Group:   The focus of this group is to help patients review their daily goal of treatment and discuss progress on daily workbooks.     Participation Level:  {BHH PARTICIPATION WUJWJ:19147}  Participation Quality:  {BHH PARTICIPATION QUALITY:22265}  Affect:  {BHH AFFECT:22266}  Cognitive:  {BHH COGNITIVE:22267}  Insight: {BHH Insight2:20797}  Engagement in Group:  {BHH ENGAGEMENT IN WGNFA:21308}  Modes of Intervention:  {BHH MODES OF INTERVENTION:22269}  Additional Comments:  ***  Maglione,Angell Honse E 09/18/2023, 9:53 PM

## 2023-09-18 NOTE — ED Notes (Signed)
 Hospital meal provided, pt tolerated w/o complaints.  Waste discarded appropriately.

## 2023-09-18 NOTE — ED Provider Notes (Signed)
 Emergency Medicine Observation Re-evaluation Note  Lance [name] is a 48 y.o. male, seen on rounds today.  Pt initially presented to the ED for complaints of Psychiatric Evaluation  Currently, the patient is no acute distress. Denies any concerns at this time.  Physical Exam  Blood pressure (!) 112/59, pulse (!) 56, temperature 97.7 F (36.5 C), temperature source Oral, resp. rate 19, SpO2 98%.  Physical Exam General: No apparent distress Pulm: Normal WOB Psych: resting     ED Course / MDM     I have reviewed the labs performed to date as well as medications administered while in observation.  Recent changes in the last 24 hours include none   Plan   Current plan is to continue to wait for psych plan/placement if felt warranted  Patient is not under full IVC at this time.   Concha Se, MD 09/18/23 [phone]

## 2023-09-18 NOTE — ED Notes (Signed)
 Pt provided with shower supplies and clean scrubs. Pt taking shower.

## 2023-09-19 DIAGNOSIS — F411 Generalized anxiety disorder: Secondary | ICD-10-CM | POA: Diagnosis not present

## 2023-09-19 NOTE — ED Notes (Signed)
 Hospital meal provided, pt tolerated w/o complaints.  Waste discarded appropriately.

## 2023-09-19 NOTE — ED Notes (Signed)
 Afternoon snack of chips and water was provided to patient.

## 2023-09-19 NOTE — ED Notes (Signed)
 Lunch tray and beverage given; Pt tolerated well, waste disposed of properly

## 2023-09-19 NOTE — ED Notes (Signed)
Patient is vol pending toc placement

## 2023-09-19 NOTE — ED Notes (Signed)
 Pt received snack and drink

## 2023-09-19 NOTE — ED Provider Notes (Signed)
 Emergency Medicine Observation Re-evaluation Note  Lance [name] is a 48 y.o. male, seen on rounds today.  Pt initially presented to the ED for complaints of Psychiatric Evaluation  Currently, the patient is no acute distress, sleeping  Physical Exam   Vitals:   09/18/23 0829 09/18/23 2030  BP: 99/66 115/69  Pulse: 62 66  Resp: 18 19  Temp: 97.9 F (36.6 C) 97.6 F (36.4 C)  SpO2: 95% 99%   Resting comfortably without distress ED Course / MDM     I have reviewed the labs performed to date as well as medications administered while in observation.  Recent changes in the last 24 hours include none   Plan   Current plan is to continue to wait for psych plan/placement if felt warranted  Patient is not under full IVC at this time.  Await TOC plan   Sharyn Creamer, MD 09/19/23 [phone]

## 2023-09-19 NOTE — ED Notes (Signed)
 Vol  pending  TOC  placement

## 2023-09-20 DIAGNOSIS — F411 Generalized anxiety disorder: Secondary | ICD-10-CM | POA: Diagnosis not present

## 2023-09-20 NOTE — ED Notes (Signed)
 Md [place] again regarding medications.

## 2023-09-20 NOTE — ED Notes (Signed)
Pt given shower supplies and clean scrubs

## 2023-09-20 NOTE — ED Notes (Signed)
 This RN called pharmacy called and inquired about medications for pt. Tech added to chat with MD Fuller Plan so we can get orders placed for pt.

## 2023-09-20 NOTE — ED Notes (Signed)
 Messaged Child psychotherapist regarding any updates for pt.

## 2023-09-20 NOTE — ED Notes (Signed)
 Pt provided with dinner tray. Pt sitting up eating

## 2023-09-20 NOTE — TOC Progression Note (Signed)
 Transition of Care [facility]) - Progression Note    Patient Details  Name: Lance [name] MRN: [id number] Date of Birth: 1975-10-22  [facility]) CM/SW Contact  Elberta Fortis, RN Phone Number: 09/20/2023, 2:22 PM  Clinical Narrative:    Called and left message for his guardian [name] to inquire if she's found a new group home for pt. Waiting for a return call. TOC to continue to follow.    Expected Discharge Plan: Group Home (psychiatric group home) Barriers to Discharge: ED Unsafe disposition  Expected Discharge Plan and Services   Discharge Planning Services: CM Consult   Living arrangements for the past 2 months: Group Home                                       Social Determinants of Health (SDOH) Interventions SDOH Screenings   Food Insecurity: No Food Insecurity (08/19/2022)  Recent Concern: Food Insecurity - Food Insecurity Present (06/17/2022)  Housing: Low Risk  (08/19/2022)  Recent Concern: Housing - High Risk (06/17/2022)  Transportation Needs: Unmet Transportation Needs (08/19/2022)  Utilities: Not At Risk (08/19/2022)  Alcohol Screen: Low Risk  (08/19/2022)  Depression (PHQ2-9): Low Risk  (03/20/2021)  Financial Resource Strain: Low Risk  (03/20/2021)  Physical Activity: Insufficiently Active (03/20/2021)  Social Connections: Socially Isolated (03/20/2021)  Stress: No Stress Concern Present (03/20/2021)  Tobacco Use: High Risk (09/11/2023)    Readmission Risk Interventions     No data to display

## 2023-09-20 NOTE — ED Provider Notes (Signed)
 Emergency Medicine Observation Re-evaluation Note  Lance [name] is a 48 y.o. male, seen on rounds today.  Pt initially presented to the ED for complaints of Psychiatric Evaluation Currently, the patient is resting comfortably.  Physical Exam  BP 107/64 (BP Location: Left Arm)   Pulse 65   Temp 98.8 F (37.1 C) (Oral)   Resp 17   SpO2 97%  Physical Exam General: Appears in no acute distress Cardiac: Well-perfused Lungs: Breathing comfortably Psych: Calm, no distress  ED Course / MDM  EKG:   I have reviewed the labs performed to date as well as medications administered while in observation.  No recent changes in last 24 hours.  Plan  Current plan is for psychiatric disposition recommendations.    Marinell Blight, MD 09/20/23 1026

## 2023-09-20 NOTE — ED Notes (Signed)
 Pt received snack, declined drink

## 2023-09-20 NOTE — TOC Progression Note (Signed)
 Transition of Care [facility]) - Progression Note    Patient Details  Name: Lance [name] MRN: [id number] Date of Birth: [birth year]  [facility]) CM/SW Contact  Elberta Fortis, RN Phone Number: 09/20/2023, 3:39 PM  Clinical Narrative:    Met with patient on the ED BHU. He's getting anxious about being in the hospital so long. Reassured him that we are looking for a group home and I'm waiting for his guardian to get back with me. He mentioned that he normally takes clozaril 250mg  PO at bedtime and isn't receiving it here. I had spoken with the covering doctor 4 days ago and he said would order it. Discussed with RN in the [facility] and she sent the MD a message about the clozaril. TOC to continue to follow.  4/7: Received call from his guardian Maureen Ralphs and she's still waiting to hear back from [place] about his placement.   Expected Discharge Plan: Group Home (psychiatric group home) Barriers to Discharge: ED Unsafe disposition  Expected Discharge Plan and Services   Discharge Planning Services: CM Consult   Living arrangements for the past 2 months: Group Home                                       Social Determinants of Health (SDOH) Interventions SDOH Screenings   Food Insecurity: No Food Insecurity (08/19/2022)  Recent Concern: Food Insecurity - Food Insecurity Present (06/17/2022)  Housing: Low Risk  (08/19/2022)  Recent Concern: Housing - High Risk (06/17/2022)  Transportation Needs: Unmet Transportation Needs (08/19/2022)  Utilities: Not At Risk (08/19/2022)  Alcohol Screen: Low Risk  (08/19/2022)  Depression (PHQ2-9): Low Risk  (03/20/2021)  Financial Resource Strain: Low Risk  (03/20/2021)  Physical Activity: Insufficiently Active (03/20/2021)  Social Connections: Socially Isolated (03/20/2021)  Stress: No Stress Concern Present (03/20/2021)  Tobacco Use: High Risk (09/11/2023)    Readmission Risk Interventions     No data to display

## 2023-09-20 NOTE — ED Notes (Signed)
PT VOL/PENDING TOC PLACEMENT.

## 2023-09-20 NOTE — ED Notes (Signed)
 MD Doyce Para regarding pt home medications

## 2023-09-20 NOTE — ED Notes (Signed)
 Pt provided with breakfast tray.

## 2023-09-20 NOTE — ED Notes (Addendum)
 Pt provided with lunch tray.

## 2023-09-20 NOTE — ED Notes (Signed)
 MD Philmore Pali regarding pt medications as the MD prior is no longer here.

## 2023-09-21 DIAGNOSIS — F411 Generalized anxiety disorder: Secondary | ICD-10-CM | POA: Diagnosis not present

## 2023-09-21 NOTE — ED Notes (Signed)
VOL/Pending Placement 
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
## 2023-09-21 NOTE — ED Provider Notes (Signed)
 Emergency Medicine Observation Re-evaluation Note  No issues overnight  Physical Exam  BP 97/72 (BP Location: Left Arm)   Pulse 62   Temp 98.5 F (36.9 C) (Oral)   Resp 17   SpO2 98%    ED Course / MDM  No emergent active medical issues at this time  Plan  TOC is working on group home placement    Jene Every, MD 09/21/23 0710

## 2023-09-21 NOTE — ED Notes (Signed)
 This tech obtained vital signs on pt.
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
## 2023-09-21 NOTE — ED Notes (Signed)
 Corbin Ade Doctor, hospital Guardian) [phone] (Work Phone) Called back to inquire about requested conversation with Mr Wickens.  Ms Tiburcio Pea stated that "Salif really is special nobody could believe what he had done"  Staff shared information received from [facility] as well as rep from [facility].  Ms Maureen Ralphs stated "well I guess tomorrow I'll have to start looking for other group homes" implying that since admission/clearance > 9days prior Ms Tiburcio Pea has only attempted to place Mr Dollins at a single group home.  Staff encouraged LG to expand housing search.

## 2023-09-22 DIAGNOSIS — F411 Generalized anxiety disorder: Secondary | ICD-10-CM | POA: Diagnosis not present

## 2023-09-22 NOTE — ED Notes (Signed)
 Lance [name] continues to present at baseline.  Calm, and compliant with medications. Cont to monitor as ordered.

## 2023-09-22 NOTE — ED Provider Notes (Signed)
-----------------------------------------   7:34 AM on 09/22/2023 -----------------------------------------   Blood pressure 99/60, pulse 60, temperature 97.8 F (36.6 C), temperature source Oral, resp. rate 18, height 5\' 10"  (1.778 m), weight 79.4 kg, SpO2 96%.  The patient is calm and cooperative at this time.  There have been no acute events since the last update.  Awaiting disposition plan from [facility] team for group home placement.   Janith Lima, MD 09/22/23 [phone]

## 2023-09-22 NOTE — ED Notes (Addendum)
 Spoke to Mr [facility] Officer Pettiford @ [phone] [place] probation office.  He stated that Mr Gambale can be placed in any GH through out the state as long as they assist with medication mgmt.  Prior conversations with LG Maureen Ralphs she had stated her understanding is that Mr Kissoon is required to stay in the county.

## 2023-09-22 NOTE — ED Notes (Signed)
 Pt received snack, declined drink

## 2023-09-23 DIAGNOSIS — F411 Generalized anxiety disorder: Secondary | ICD-10-CM | POA: Diagnosis not present

## 2023-09-23 LAB — CBG MONITORING, ED: Glucose-Capillary: 112 mg/dL — ABNORMAL HIGH (ref 70–99)

## 2023-09-23 MED ORDER — MELATONIN 5 MG PO TABS
5.0000 mg | ORAL_TABLET | Freq: Every day | ORAL | Status: DC
  Administered 2023-09-23 – 2023-10-07 (×15): 5 mg via ORAL
  Filled 2023-09-23 (×15): qty 1

## 2023-09-23 NOTE — ED Notes (Signed)
 Chaplain Dejuan Came to speak and Pray with Pt

## 2023-09-23 NOTE — ED Notes (Signed)
 Pt given hygiene items to shower

## 2023-09-23 NOTE — ED Provider Notes (Signed)
 Vitals:   09/21/23 1934 09/22/23 1935  BP: 99/60 100/62  Pulse: 60 (!) 58  Resp: 18 17  Temp: 97.8 F (36.6 C) 97.7 F (36.5 C)  SpO2: 96% 99%     Patient is currently resting comfortably without distress.  No acute events reported by nursing overnight  Plan of care is currently [facility] working to evaluate for group home placement   Sharyn Creamer, MD 09/23/23 [phone]

## 2023-09-23 NOTE — ED Notes (Addendum)
 Snack and water was provided for patient.

## 2023-09-23 NOTE — ED Notes (Signed)
VOL/Pending Placement 

## 2023-09-23 NOTE — TOC Progression Note (Addendum)
 Transition of Care [facility]) - Progression Note    Patient Details  Name: Lance [name] MRN: [id number] Date of Birth: 01/21/1976  [facility]) CM/SW Contact  Margarito Liner, LCSW Phone Number: 09/23/2023, 10:13 AM  Clinical Narrative:  CSW left voicemail for legal guardian to see if there were any updates on placement at [facility].   3:02 pm: Guardian has not received a direct answer from [facility] yet. A facility that came to assess him yesterday is unable to accept him. Guardian is still working on finding placement.  Expected Discharge Plan: Group Home (psychiatric group home) Barriers to Discharge: ED Unsafe disposition  Expected Discharge Plan and Services   Discharge Planning Services: CM Consult   Living arrangements for the past 2 months: Group Home                                       Social Determinants of Health (SDOH) Interventions SDOH Screenings   Food Insecurity: No Food Insecurity (08/19/2022)  Recent Concern: Food Insecurity - Food Insecurity Present (06/17/2022)  Housing: Low Risk  (08/19/2022)  Recent Concern: Housing - High Risk (06/17/2022)  Transportation Needs: Unmet Transportation Needs (08/19/2022)  Utilities: Not At Risk (08/19/2022)  Alcohol Screen: Low Risk  (08/19/2022)  Depression (PHQ2-9): Low Risk  (03/20/2021)  Financial Resource Strain: Low Risk  (03/20/2021)  Physical Activity: Insufficiently Active (03/20/2021)  Social Connections: Socially Isolated (03/20/2021)  Stress: No Stress Concern Present (03/20/2021)  Tobacco Use: High Risk (09/11/2023)    Readmission Risk Interventions     No data to display

## 2023-09-23 NOTE — ED Notes (Signed)
 vol/pending placement.Marland Kitchen

## 2023-09-23 NOTE — Progress Notes (Signed)
   09/23/23 1400  Spiritual Encounters  Type of Visit Initial  Care provided to: Patient  Referral source Nurse (RN/NT/LPN)  OnCall Visit Yes  Spiritual Framework  Presenting Themes Meaning/purpose/sources of inspiration  Interventions  Spiritual Care Interventions Made Established relationship of care and support;Compassionate presence;Prayer   Chaplain provided compassionate presence. Pt. Shared excitement about going to a new place to live. The pt. Requested prayer and prayer was shared.Reading material was requested by pt and the nurse stated if must be paperback whatever is brought for the pt.

## 2023-09-23 NOTE — ED Notes (Signed)
 Per pt's legal guardian, the placement that had been discussed earlier for pt has been rescinded. Lance [name] would not give the reason stating she'd rather speak with the SW.

## 2023-09-23 NOTE — ED Notes (Signed)
 Pt Given Breakfast Tray.Marland KitchenMarland Kitchen

## 2023-09-23 NOTE — ED Notes (Signed)
 Pt was Given Lunch tray

## 2023-09-24 DIAGNOSIS — F411 Generalized anxiety disorder: Secondary | ICD-10-CM | POA: Diagnosis not present

## 2023-09-24 NOTE — TOC Progression Note (Signed)
 Transition of Care [facility]) - Progression Note    Patient Details  Name: Lance [name] MRN: [id number] Date of Birth: 1975-12-21  [facility]) CM/SW Contact  Elberta Fortis, RN Phone Number: 09/24/2023, 4:45 PM  Clinical Narrative:    Vilinda Boehringer is looking for placement. Called and left a message requesting a call back. TOC to continue to follow.   Expected Discharge Plan: Group Home (psychiatric group home) Barriers to Discharge: ED Unsafe disposition  Expected Discharge Plan and Services   Discharge Planning Services: CM Consult   Living arrangements for the past 2 months: Group Home                                       Social Determinants of Health (SDOH) Interventions SDOH Screenings   Food Insecurity: No Food Insecurity (08/19/2022)  Recent Concern: Food Insecurity - Food Insecurity Present (06/17/2022)  Housing: Low Risk  (08/19/2022)  Recent Concern: Housing - High Risk (06/17/2022)  Transportation Needs: Unmet Transportation Needs (08/19/2022)  Utilities: Not At Risk (08/19/2022)  Alcohol Screen: Low Risk  (08/19/2022)  Depression (PHQ2-9): Low Risk  (03/20/2021)  Financial Resource Strain: Low Risk  (03/20/2021)  Physical Activity: Insufficiently Active (03/20/2021)  Social Connections: Socially Isolated (03/20/2021)  Stress: No Stress Concern Present (03/20/2021)  Tobacco Use: High Risk (09/11/2023)    Readmission Risk Interventions     No data to display

## 2023-09-24 NOTE — ED Notes (Signed)
VOL/Pending Placement 

## 2023-09-24 NOTE — ED Notes (Signed)
 RN took over care for this pt. Pt ABCs intact. RR even and unlabored. Pt in NAD.   Past Medical History:  Diagnosis Date   Depression    Diabetes mellitus without complication (HCC)    Hyperlipidemia    Hypertension    Lupus anticoagulant disorder (HCC) 06/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   PE (pulmonary thromboembolism) (HCC)    Schizo affective schizophrenia (HCC)    Supratherapeutic INR 11/19/2016

## 2023-09-24 NOTE — ED Notes (Signed)
 Pt was given snack and cranberry juice

## 2023-09-24 NOTE — ED Provider Notes (Signed)
-----------------------------------------   7:52 AM on 09/24/2023 -----------------------------------------   Blood pressure 109/71, pulse 65, temperature 98 F (36.7 C), temperature source Oral, resp. rate 16, height 5\' 10"  (1.778 m), weight 79.4 kg, SpO2 98%.  The patient is calm and cooperative at this time.  There have been no acute events since the last update.  Awaiting disposition plan from case management/social work.    Corena Herter, MD 09/24/23 [phone]

## 2023-09-24 NOTE — ED Notes (Signed)
 Pt asked to use phone told pt it was after phone use hours
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
## 2023-09-25 DIAGNOSIS — F411 Generalized anxiety disorder: Secondary | ICD-10-CM | POA: Diagnosis not present

## 2023-09-25 LAB — QUANTIFERON-TB GOLD PLUS (RQFGPL)
QuantiFERON Mitogen Value: >10 [IU]/mL
QuantiFERON Nil Value: 0.04 [IU]/mL
QuantiFERON TB1 Ag Value: 0.13 [IU]/mL
QuantiFERON TB2 Ag Value: 0.19 [IU]/mL

## 2023-09-25 LAB — QUANTIFERON-TB GOLD PLUS: QuantiFERON-TB Gold Plus: NEGATIVE

## 2023-09-25 NOTE — ED Notes (Signed)
Pt was provided with pm snack.

## 2023-09-25 NOTE — ED Notes (Signed)
  Pt was provided his lunch tray and  Diet Beverage.

## 2023-09-25 NOTE — ED Notes (Signed)
  Pt was provided her Dinner tray and Cranberry Juice .

## 2023-09-25 NOTE — ED Notes (Signed)
 Pt was provided with shower supplies. Shower completed

## 2023-09-25 NOTE — ED Notes (Signed)
VOL/Pending Placement 

## 2023-09-25 NOTE — ED Notes (Signed)
Pt went to the bathroom.

## 2023-09-25 NOTE — ED Provider Notes (Signed)
 Emergency Medicine Observation Re-evaluation Note  Lance [name] is a 48 y.o. male, seen on rounds today.  Pt initially presented to the ED for complaints of Psychiatric Evaluation Currently, the patient is resting in no acute distress.  Physical Exam  BP 122/79 (BP Location: Right Arm)   Pulse 62   Temp 97.8 F (36.6 C) (Oral)   Resp 16   Ht 5\' 10"  (1.778 m)   Wt 79.4 kg   SpO2 97%   BMI 25.12 kg/m  Physical Exam General: No acute distress  ED Course / MDM  EKG:   I have reviewed the labs performed to date as well as medications administered while in observation.  No acute events overnight.  Plan  Current plan is for psychiatric disposition recommendations.    Collis Deaner, MD 09/25/23 [phone]

## 2023-09-25 NOTE — ED Notes (Signed)
 Pt was provided breakfast tray and juice for patient.

## 2023-09-26 DIAGNOSIS — F411 Generalized anxiety disorder: Secondary | ICD-10-CM | POA: Diagnosis not present

## 2023-09-26 NOTE — ED Notes (Signed)
Patient is vol pending placement 

## 2023-09-26 NOTE — ED Provider Notes (Signed)
-----------------------------------------   6:22 AM on 09/26/2023 -----------------------------------------   Blood pressure 114/84, pulse (!) 58, temperature 97.8 F (36.6 C), temperature source Oral, resp. rate 17, height 1.778 m (5\' 10" ), weight 79.4 kg, SpO2 98%.  The patient is calm and cooperative at this time.  There have been no acute events since the last update.  Awaiting disposition plan from [facility] team.   Lynnda Sas, MD 09/26/23 [phone]

## 2023-09-26 NOTE — ED Notes (Signed)
 Pt provided lunch tray. Collected breakfast tray 100% eaten.

## 2023-09-26 NOTE — ED Notes (Signed)
 Patient came to door asking for shower. Patient was provided with shower materials.

## 2023-09-26 NOTE — ED Notes (Signed)
 Breakfast and juice was provided for patient this morning.

## 2023-09-27 DIAGNOSIS — F411 Generalized anxiety disorder: Secondary | ICD-10-CM | POA: Diagnosis not present

## 2023-09-27 NOTE — TOC Progression Note (Signed)
 Transition of Care [facility]) - Progression Note    Patient Details  Name: Lance [name] MRN: [id number] Date of Birth: [birth year]  Transition of Care [facility]) CM/SW Contact  Odilia Bennett, LCSW Phone Number: 09/27/2023, 12:56 PM  Clinical Narrative:  Received call from legal guardian. [name] from [facility] is coming to assess patient this afternoon.   Expected Discharge Plan: Group Home (psychiatric group home) Barriers to Discharge: ED Unsafe disposition  Expected Discharge Plan and Services   Discharge Planning Services: CM Consult   Living arrangements for the past 2 months: Group Home                                       Social Determinants of Health (SDOH) Interventions SDOH Screenings   Food Insecurity: No Food Insecurity (08/19/2022)  Recent Concern: Food Insecurity - Food Insecurity Present (06/17/2022)  Housing: Low Risk  (08/19/2022)  Recent Concern: Housing - High Risk (06/17/2022)  Transportation Needs: Unmet Transportation Needs (08/19/2022)  Utilities: Not At Risk (08/19/2022)  Alcohol Screen: Low Risk  (08/19/2022)  Depression (PHQ2-9): Low Risk  (03/20/2021)  Financial Resource Strain: Low Risk  (03/20/2021)  Physical Activity: Insufficiently Active (03/20/2021)  Social Connections: Socially Isolated (03/20/2021)  Stress: No Stress Concern Present (03/20/2021)  Tobacco Use: High Risk (09/11/2023)    Readmission Risk Interventions     No data to display
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
## 2023-09-27 NOTE — ED Notes (Signed)
Vol /pending placement 

## 2023-09-27 NOTE — ED Notes (Signed)
 Pt receive dinner tray w/ beverage

## 2023-09-27 NOTE — ED Notes (Signed)
 Pt receive lunch tray w/ beverage

## 2023-09-27 NOTE — ED Provider Notes (Signed)
 Emergency Medicine Observation Re-evaluation Note  Mitsugi Schrader is a 48 y.o. male, seen on rounds today.  Pt initially presented to the ED for complaints of Psychiatric Evaluation  Currently, the patient is no acute distress. Resting in bed  Physical Exam  Blood pressure 111/74, pulse 66, temperature 98.1 F (36.7 C), temperature source Oral, resp. rate 17, height 5\' 10"  (1.778 m), weight 79.4 kg, SpO2 98%.  Physical Exam General: No apparent distress Pulm: Normal WOB Psych: resting     ED Course / MDM     I have reviewed the labs performed to date as well as medications administered while in observation.   Plan   Current plan is to continue to wait for psych plan/placement if felt warranted  Patient is not under full IVC at this time.   Lubertha Rush, MD 09/27/23 [phone]

## 2023-09-27 NOTE — TOC Progression Note (Addendum)
 Transition of Care [facility]) - Progression Note    Patient Details  Name: Lance [name] MRN: [id number] Date of Birth: [birth year]  Transition [facility]) CM/SW Contact  Elmira Haddock, LCSW Phone Number: 09/27/2023, 5:55 PM  Clinical Narrative:   CSW called [facility] [phone]) about the visit today.  RN sent Epic chat stating that no one had come to see patient.  LVM for a return call about when they plan on visiting patient.    Expected Discharge Plan: Group Home (psychiatric group home) Barriers to Discharge: ED Unsafe disposition  Expected Discharge Plan and Services   Discharge Planning Services: CM Consult   Living arrangements for the past 2 months: Group Home                                       Social Determinants of Health (SDOH) Interventions SDOH Screenings   Food Insecurity: No Food Insecurity (08/19/2022)  Recent Concern: Food Insecurity - Food Insecurity Present (06/17/2022)  Housing: Low Risk  (08/19/2022)  Recent Concern: Housing - High Risk (06/17/2022)  Transportation Needs: Unmet Transportation Needs (08/19/2022)  Utilities: Not At Risk (08/19/2022)  Alcohol Screen: Low Risk  (08/19/2022)  Depression (PHQ2-9): Low Risk  (03/20/2021)  Financial Resource Strain: Low Risk  (03/20/2021)  Physical Activity: Insufficiently Active (03/20/2021)  Social Connections: Socially Isolated (03/20/2021)  Stress: No Stress Concern Present (03/20/2021)  Tobacco Use: High Risk (09/11/2023)    Readmission Risk Interventions     No data to display
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
## 2023-09-27 NOTE — ED Notes (Signed)
 Pt was given phone to place a call

## 2023-09-27 NOTE — ED Notes (Signed)
 Patient asked about disposition plan, social worker informed this RN that someone was supposed to come see patient today, however no one has visited during this Rns shift. This RN let social worker know, who said they are going to reach out to them.

## 2023-09-28 DIAGNOSIS — F411 Generalized anxiety disorder: Secondary | ICD-10-CM | POA: Diagnosis not present

## 2023-09-28 NOTE — ED Provider Notes (Signed)
 Emergency Medicine Observation Re-evaluation Note  Lance [name] is a 48 y.o. male, seen on rounds today.  Pt initially presented to the ED for complaints of Psychiatric Evaluation  Currently, the patient is calm, no acute complaints.  Physical Exam  Blood pressure 115/74, pulse 72, temperature 98.1 F (36.7 C), temperature source Oral, resp. rate 17, height 5\' 10"  (1.778 m), weight 79.4 kg, SpO2 100%. Physical Exam General: NAD Lungs: CTAB Psych: not agitated  ED Course / MDM  EKG:    I have reviewed the labs performed to date as well as medications administered while in observation.  Recent changes in the last 24 hours include no acute events overnight.    Plan  Current plan is for TOC placement.   Jacquie Maudlin, MD 09/28/23 1014

## 2023-09-28 NOTE — ED Notes (Signed)
 Pt. Alert and oriented, warm and dry, in no distress. Pt. Denies SI, HI, and AVH. Pt. Encouraged to let nursing staff know of any concerns or needs.  ENVIRONMENTAL ASSESSMENT Potentially harmful objects out of patient reach: Yes.   Personal belongings secured: Yes.   Patient dressed in hospital provided attire only: Yes.   Plastic bags out of patient reach: Yes.   Patient care equipment (cords, cables, call bells, lines, and drains) shortened, removed, or accounted for: Yes.   Equipment and supplies removed from bottom of stretcher: Yes.   Potentially toxic materials out of patient reach: Yes.   Sharps container removed or out of patient reach: Yes.  '

## 2023-09-28 NOTE — ED Notes (Signed)
 Meds given.

## 2023-09-28 NOTE — ED Notes (Signed)
Snack was provided with soda. No other needs at this time   

## 2023-09-28 NOTE — TOC Progression Note (Addendum)
 Transition of Care [facility]) - Progression Note    Patient Details  Name: Lance [name] MRN: [id number] Date of Birth: July 16, 1975  Transition of Care [facility]) CM/SW Contact  Elmira Haddock, LCSW Phone Number: 09/28/2023, 10:37 AM  Clinical Narrative:   CSW called [facility] and spoke with the owner, Rolande Cleverly.  He'll get by to see patient some time this week.   12:37pm  Rolande Cleverly from [facility] came by to see patient.  He will offer patient a bed. He gave a date of 4/24, however, it could be sooner.  He will keep TOC updated.  Patient already has a FL2, TB test 1 & 2.  Jevon will need those at discharge. Vivian, patient's guardian, has been informed [phone]).  Jevon notified that the items he's requesting is already in the chart.    Expected Discharge Plan: Group Home (psychiatric group home) Barriers to Discharge: ED Unsafe disposition  Expected Discharge Plan and Services   Discharge Planning Services: CM Consult   Living arrangements for the past 2 months: Group Home                                       Social Determinants of Health (SDOH) Interventions SDOH Screenings   Food Insecurity: No Food Insecurity (08/19/2022)  Recent Concern: Food Insecurity - Food Insecurity Present (06/17/2022)  Housing: Low Risk  (08/19/2022)  Recent Concern: Housing - High Risk (06/17/2022)  Transportation Needs: Unmet Transportation Needs (08/19/2022)  Utilities: Not At Risk (08/19/2022)  Alcohol Screen: Low Risk  (08/19/2022)  Depression (PHQ2-9): Low Risk  (03/20/2021)  Financial Resource Strain: Low Risk  (03/20/2021)  Physical Activity: Insufficiently Active (03/20/2021)  Social Connections: Socially Isolated (03/20/2021)  Stress: No Stress Concern Present (03/20/2021)  Tobacco Use: High Risk (09/11/2023)    Readmission Risk Interventions     No data to display

## 2023-09-28 NOTE — ED Notes (Signed)
Pt to the restroom at this time

## 2023-09-28 NOTE — ED Notes (Signed)
 Patient has a visitor in lobby at this time.

## 2023-09-28 NOTE — ED Notes (Signed)
 Hospital meal provided, pt tolerated w/o complaints.  Waste discarded appropriately.

## 2023-09-28 NOTE — ED Notes (Signed)
 Patient is sitting in the dayroom, interacts with others well, no signs of distress.

## 2023-09-28 NOTE — ED Notes (Signed)
 Vol/ TOC pending placement

## 2023-09-29 DIAGNOSIS — F411 Generalized anxiety disorder: Secondary | ICD-10-CM | POA: Diagnosis not present

## 2023-09-29 NOTE — ED Notes (Signed)
 Pt receive Dinner tray w/Beverage

## 2023-09-29 NOTE — ED Notes (Signed)
 Vol toc pending placement

## 2023-09-29 NOTE — ED Notes (Signed)
 Pt. Alert and oriented, warm and dry, in no distress. Pt. Denies SI, HI, and AVH. Pt. Encouraged to let nursing staff know of any concerns or needs.  ENVIRONMENTAL ASSESSMENT Potentially harmful objects out of patient reach: Yes.   Personal belongings secured: Yes.   Patient dressed in hospital provided attire only: Yes.   Plastic bags out of patient reach: Yes.   Patient care equipment (cords, cables, call bells, lines, and drains) shortened, removed, or accounted for: Yes.   Equipment and supplies removed from bottom of stretcher: Yes.   Potentially toxic materials out of patient reach: Yes.   Sharps container removed or out of patient reach: Yes.  '

## 2023-09-29 NOTE — ED Notes (Signed)
 Patient took po morning medications, no signs of distress, will continue to monitor for safety.

## 2023-09-29 NOTE — ED Notes (Addendum)
 Pt receive Lunch tray w/Beverage

## 2023-09-29 NOTE — ED Notes (Addendum)
 Pt has remains calm has been back and forth to the restroom

## 2023-09-29 NOTE — ED Notes (Signed)
  Pt receive Breakfast tray w/ Juice

## 2023-09-29 NOTE — ED Notes (Signed)
 Pt has been given a snack

## 2023-09-29 NOTE — ED Notes (Signed)
 Pt was Given [employer]. Shower Completed

## 2023-09-29 NOTE — ED Provider Notes (Signed)
 Emergency Medicine Observation Re-evaluation Note  Lance [name] is a 48 y.o. male, seen on rounds today.  Pt initially presented to the ED for complaints of Psychiatric Evaluation  Currently, the patient is calm, no acute complaints.  Physical Exam  Blood pressure 119/73, pulse 64, temperature 98.1 F (36.7 C), temperature source Oral, resp. rate 19, height 5\' 10"  (1.778 m), weight 79.4 kg, SpO2 97%. Physical Exam General: NAD Lungs: CTAB Psych: not agitated  ED Course / MDM  EKG:    I have reviewed the labs performed to date as well as medications administered while in observation.  Recent changes in the last 24 hours include no acute events overnight.    Plan  Current plan is for TOC placement.   Jacquie Maudlin, MD 09/29/23 0730

## 2023-09-30 DIAGNOSIS — F411 Generalized anxiety disorder: Secondary | ICD-10-CM | POA: Diagnosis not present

## 2023-09-30 NOTE — ED Notes (Signed)
 Lunch tray provided.

## 2023-09-30 NOTE — ED Notes (Signed)
 Hospital meal provided, pt tolerated w/o complaints.  Waste discarded appropriately.

## 2023-09-30 NOTE — ED Notes (Signed)
Pt given breakfast tray and beverage.  

## 2023-09-30 NOTE — ED Notes (Signed)
Pt given snack at this time  

## 2023-09-30 NOTE — ED Provider Notes (Signed)
 Emergency Medicine Observation Re-evaluation Note  Edon Hoadley is a 48 y.o. male, seen on rounds today.  Pt initially presented to the ED for complaints of Psychiatric Evaluation  Currently, the patient is resting comfortably.  Physical Exam  BP 91/72 (BP Location: Left Arm)   Pulse 60   Temp 97.6 F (36.4 C) (Axillary)   Resp 17   Ht 5\' 10"  (1.778 m)   Wt 79.4 kg   SpO2 98%   BMI 25.12 kg/m  General: No acute distress Cardiac: Well-perfused extremities Lungs: No respiratory distress Psych: Appropriate mood and affect  ED Course / MDM  EKG:   I have reviewed the labs performed to date as well as medications administered while in observation.  Recent changes in the last 24 hours include none.  Plan  Current plan is for placement.   Caius Silbernagel K, MD 09/30/23 1027

## 2023-10-01 DIAGNOSIS — F411 Generalized anxiety disorder: Secondary | ICD-10-CM | POA: Diagnosis not present

## 2023-10-01 NOTE — ED Notes (Signed)
VOL/TOC pending placement

## 2023-10-01 NOTE — ED Notes (Signed)
 Vol/ TOC pending placement

## 2023-10-01 NOTE — ED Notes (Signed)
 Assumed care of patient.

## 2023-10-01 NOTE — ED Notes (Signed)
 Snack was provided to patient.

## 2023-10-02 DIAGNOSIS — F411 Generalized anxiety disorder: Secondary | ICD-10-CM | POA: Diagnosis not present

## 2023-10-02 NOTE — ED Provider Notes (Signed)
-----------------------------------------   4:25 AM on 10/02/2023 -----------------------------------------   Blood pressure 117/84, pulse 66, temperature 98.4 F (36.9 C), resp. rate 16, height 1.778 m (5\' 10" ), weight 79.4 kg, SpO2 97%.  The patient is calm and cooperative at this time.  There have been no acute events since the last update.  Awaiting disposition plan from [facility] team.   Lynnda Sas, MD 10/02/23 [phone]

## 2023-10-02 NOTE — ED Notes (Signed)
Pt given shower supplies and shower room unlocked.  

## 2023-10-02 NOTE — ED Notes (Signed)
 Pt Dinner has been provided

## 2023-10-02 NOTE — ED Notes (Signed)
 Pt snack has been provided

## 2023-10-02 NOTE — ED Notes (Signed)
Pt given snack at this time  

## 2023-10-02 NOTE — ED Notes (Signed)
Patient is vol pending placement 

## 2023-10-02 NOTE — ED Notes (Signed)
 Pt Lunch has been provided

## 2023-10-02 NOTE — ED Notes (Signed)
 Pt breakfast has been provided

## 2023-10-03 DIAGNOSIS — F411 Generalized anxiety disorder: Secondary | ICD-10-CM | POA: Diagnosis not present

## 2023-10-03 NOTE — ED Notes (Signed)
Snack given to pt.

## 2023-10-03 NOTE — ED Notes (Signed)
 Afternoon snack provided to patient.

## 2023-10-03 NOTE — ED Notes (Signed)
 Lunch tray and beverage provided to patient; Patient tolerated well and waste disposed of properly.

## 2023-10-03 NOTE — ED Notes (Signed)
 Lance [name] continues to present at baseline.  Calm, and compliant with medications. Cont to monitor as ordered.

## 2023-10-03 NOTE — ED Notes (Signed)
Pt provided with dinner tray and drink.  

## 2023-10-03 NOTE — ED Notes (Signed)
VOL/Pending Placement 

## 2023-10-03 NOTE — ED Provider Notes (Signed)
 [facility] Observation Note   ----------------------------------------- 12:52 PM on 10/03/2023 -----------------------------------------  Lance [name] is a 48 y.o. male currently boarding in the Emergency Department.  No acute events since last update.  Recent Vitals   Most recent vital signs: Vitals:   10/02/23 1936 10/03/23 0710  BP: 120/74 111/63  Pulse: 73 (!) 52  Resp: 16 17  Temp: 98.7 F (37.1 C) 98.6 F (37 C)  SpO2: 97% 96%    ED Results / Procedures / Treatments   Labs (all labs ordered are listed, but only abnormal results are displayed) Labs Reviewed  COMPREHENSIVE METABOLIC PANEL WITH GFR - Abnormal; Notable for the following components:      Result Value   CO2 21 (*)    Glucose, Bld 163 (*)    All other components within normal limits  SALICYLATE LEVEL - Abnormal; Notable for the following components:   Salicylate Lvl <7.0 (*)    All other components within normal limits  ACETAMINOPHEN  LEVEL - Abnormal; Notable for the following components:   Acetaminophen  (Tylenol ), Serum <10 (*)    All other components within normal limits  CBG MONITORING, ED - Abnormal; Notable for the following components:   Glucose-Capillary 130 (*)    All other components within normal limits  CBG MONITORING, ED - Abnormal; Notable for the following components:   Glucose-Capillary 130 (*)    All other components within normal limits  CBG MONITORING, ED - Abnormal; Notable for the following components:   Glucose-Capillary 180 (*)    All other components within normal limits  CBG MONITORING, ED - Abnormal; Notable for the following components:   Glucose-Capillary 125 (*)    All other components within normal limits  CBG MONITORING, ED - Abnormal; Notable for the following components:   Glucose-Capillary 141 (*)    All other components within normal limits  CBG MONITORING, ED - Abnormal; Notable for the following components:   Glucose-Capillary  151 (*)    All other components within normal limits  CBG MONITORING, ED - Abnormal; Notable for the following components:   Glucose-Capillary 107 (*)    All other components within normal limits  CBG MONITORING, ED - Abnormal; Notable for the following components:   Glucose-Capillary 102 (*)    All other components within normal limits  CBG MONITORING, ED - Abnormal; Notable for the following components:   Glucose-Capillary 150 (*)    All other components within normal limits  CBG MONITORING, ED - Abnormal; Notable for the following components:   Glucose-Capillary 113 (*)    All other components within normal limits  CBG MONITORING, ED - Abnormal; Notable for the following components:   Glucose-Capillary 138 (*)    All other components within normal limits  CBG MONITORING, ED - Abnormal; Notable for the following components:   Glucose-Capillary 127 (*)    All other components within normal limits  CBG MONITORING, ED - Abnormal; Notable for the following components:   Glucose-Capillary 107 (*)    All other components within normal limits  CBG MONITORING, ED - Abnormal; Notable for the following components:   Glucose-Capillary 115 (*)    All other components within normal limits  CBG MONITORING, ED - Abnormal; Notable for the following components:   Glucose-Capillary 105 (*)    All other components within normal limits  CBG MONITORING, ED - Abnormal; Notable for the following components:   Glucose-Capillary 128 (*)    All other components within normal limits  CBG  MONITORING, ED - Abnormal; Notable for the following components:   Glucose-Capillary 112 (*)    All other components within normal limits  ETHANOL  CBC  URINE DRUG SCREEN, QUALITATIVE ([facility] ONLY)  HEMOGLOBIN A1C  QUANTIFERON-TB GOLD PLUS  QUANTIFERON-TB GOLD PLUS (RQFGPL)  CBG MONITORING, ED  CBG MONITORING, ED    MEDICATIONS ORDERED IN ED: Medications  clonazePAM  (KLONOPIN ) disintegrating tablet 0.25 mg (0.25  mg Oral Given 10/03/23 0830)  lithium  carbonate (ESKALITH ) ER tablet 450 mg (450 mg Oral Given 10/02/23 2114)  metFORMIN  (GLUCOPHAGE ) tablet 1,000 mg (1,000 mg Oral Given 10/03/23 0830)  OLANZapine  (ZYPREXA ) tablet 20 mg (20 mg Oral Given 10/02/23 2114)  propranolol  (INDERAL ) tablet 20 mg (20 mg Oral Given 10/02/23 2113)  sertraline  (ZOLOFT ) tablet 100 mg (100 mg Oral Given 10/03/23 0831)  simvastatin  (ZOCOR ) tablet 40 mg (40 mg Oral Given 10/02/23 1746)  LORazepam  (ATIVAN ) tablet 1 mg (1 mg Oral Given 10/01/23 2113)  melatonin tablet 5 mg (5 mg Oral Given 10/02/23 2114)  LORazepam  (ATIVAN ) tablet 0.5 mg (0.5 mg Oral Given 09/11/23 1429)     ED Plan   Currently awaiting placement into an appropriate living facility.  Social work is working with the patient to help achieve this.      Ruth Cove, MD 10/03/23 1253

## 2023-10-03 NOTE — ED Notes (Signed)
Breakfast tray and juice provided to pt

## 2023-10-03 NOTE — ED Notes (Signed)
 Provided pt with shower supplies and a clean pair of scrubs with clean non-slip socks per pt request.

## 2023-10-04 DIAGNOSIS — F411 Generalized anxiety disorder: Secondary | ICD-10-CM | POA: Diagnosis not present

## 2023-10-04 NOTE — ED Notes (Signed)
VOL/pending placement 

## 2023-10-04 NOTE — ED Provider Notes (Signed)
-----------------------------------------   11:48 AM on 10/04/2023 -----------------------------------------   Blood pressure (!) 107/57, pulse 63, temperature 98.2 F (36.8 C), temperature source Oral, resp. rate 18, height 5\' 10"  (1.778 m), weight 79.4 kg, SpO2 96%.  The patient is calm and cooperative at this time, resting in his room.  There have been no acute events since the last update.  Awaiting disposition plan from the [facility] team.    Lind Repine, MD 10/04/23 1148

## 2023-10-04 NOTE — ED Notes (Signed)
 Pt. Alert and oriented, warm and dry, in no distress. Pt. Denies SI, HI, and AVH. Pt. Encouraged to let nursing staff know of any concerns or needs.  ENVIRONMENTAL ASSESSMENT Potentially harmful objects out of patient reach: Yes.   Personal belongings secured: Yes.   Patient dressed in hospital provided attire only: Yes.   Plastic bags out of patient reach: Yes.   Patient care equipment (cords, cables, call bells, lines, and drains) shortened, removed, or accounted for: Yes.   Equipment and supplies removed from bottom of stretcher: Yes.   Potentially toxic materials out of patient reach: Yes.   Sharps container removed or out of patient reach: Yes.  '

## 2023-10-05 DIAGNOSIS — F411 Generalized anxiety disorder: Secondary | ICD-10-CM | POA: Diagnosis not present

## 2023-10-05 NOTE — ED Notes (Signed)
 Pt taking shower. Pt was given hygiene items and the following, 1 clean top, 1 clean bottom, with 1 pair of disposable underwear.  Pt changed out into clean clothing.  Staff disposed of all shower supplies.

## 2023-10-05 NOTE — ED Notes (Signed)
 Pt was provided pm snack.

## 2023-10-05 NOTE — ED Notes (Signed)
 Pt remains at baseline.  Calm, cooperative, and compliant with medications. Mr Lance [name] stated that he is looking forward to his potential dc to [facility] on Thurs. 4/24. Staff to follow up with TOC to see if any additional information is needed.

## 2023-10-05 NOTE — ED Provider Notes (Signed)
 Emergency Medicine Observation Re-evaluation Note  Lance [name] is a 48 y.o. male, seen on rounds today.  Pt initially presented to the ED for complaints of Psychiatric Evaluation Currently, the patient is resting comfortably.  Physical Exam  BP (!) 116/94 (BP Location: Right Arm)   Pulse (!) 56   Temp 98.4 F (36.9 C) (Oral)   Resp 15   Ht 5\' 10"  (1.778 m)   Wt 79.4 kg   SpO2 97%   BMI 25.12 kg/m  Physical Exam General: No acute distress  ED Course / MDM  EKG:   I have reviewed the labs performed to date as well as medications administered while in observation.  Recent changes in the last 24 hours include no acute events.  Plan  Current plan is for psychiatric placement.    Kandee Orion, MD 10/05/23 [phone]

## 2023-10-06 DIAGNOSIS — F411 Generalized anxiety disorder: Secondary | ICD-10-CM | POA: Diagnosis not present

## 2023-10-06 NOTE — TOC Progression Note (Signed)
 Transition of Care [facility]) - Progression Note    Patient Details  Name: Lance [name] MRN: [id number] Date of Birth: 1976-06-12  Transition of Care [facility]) CM/SW Contact  Arminda Landmark, RN Phone Number: 10/06/2023, 12:53 PM  Clinical Narrative:    Late entry from 10/05/23: [name] to nurse on the BHU in the ED. [name] is happy to be leaving soon. Per RN Katie the person from the group home came to see [name] and picked up the [name] paperwork.    Expected Discharge Plan: Group Home (psychiatric group home) Barriers to Discharge: ED Unsafe disposition  Expected Discharge Plan and Services   Discharge Planning Services: CM Consult   Living arrangements for the past 2 months: Group Home                                       Social Determinants of Health (SDOH) Interventions SDOH Screenings   Food Insecurity: No Food Insecurity (08/19/2022)  Recent Concern: Food Insecurity - Food Insecurity Present (06/17/2022)  Housing: Low Risk  (08/19/2022)  Recent Concern: Housing - High Risk (06/17/2022)  Transportation Needs: Unmet Transportation Needs (08/19/2022)  Utilities: Not At Risk (08/19/2022)  Alcohol Screen: Low Risk  (08/19/2022)  Depression (PHQ2-9): Low Risk  (03/20/2021)  Financial Resource Strain: Low Risk  (03/20/2021)  Physical Activity: Insufficiently Active (03/20/2021)  Social Connections: Socially Isolated (03/20/2021)  Stress: No Stress Concern Present (03/20/2021)  Tobacco Use: High Risk (09/11/2023)    Readmission Risk Interventions     No data to display

## 2023-10-06 NOTE — ED Notes (Signed)
 Pt requesting PO PRN, he stated that he is "excited and anxious about leaving tomorrow, I hope everything is ok" Staff talked with pt allowing time to process thoughts and feelings. PO PRN given w/o incident. Cont to monitor as ordered

## 2023-10-06 NOTE — ED Notes (Signed)
 Pt provided PM snack and drink.

## 2023-10-06 NOTE — ED Provider Notes (Signed)
 Emergency Medicine Observation Re-evaluation Note  Lance [name] is a 48 y.o. male, awaiting TOC disposition  Physical Exam  BP 125/67 (BP Location: Right Arm)   Pulse 87   Temp 98 F (36.7 C) (Oral)   Resp 18   Ht 5\' 10"  (1.778 m)   Wt 79.4 kg   SpO2 98%   BMI 25.12 kg/m  Physical Exam General: calm   ED Course / MDM  No new labs in past 24 hours  Plan  Current plan is for TOC disposition.    Marylynn Soho, MD 10/06/23 1400

## 2023-10-06 NOTE — TOC Progression Note (Signed)
 Transition of Care [facility]) - Progression Note    Patient Details  Name: Lance [name] MRN: [id number] Date of Birth: Nov 15, 1975  Transition of Care [facility]) CM/SW Contact  Arminda Landmark, RN Phone Number: 10/06/2023, 1:09 PM  Clinical Narrative:    Eldora Greet with Javon from [facility] and he states Vitaly can't come until 4/25 as the other patient doesn't leave until 4/24. We were initially informed they could accept him on 4/24. Will notify patient and make sure guardian is aware.    Expected Discharge Plan: Group Home (psychiatric group home) Barriers to Discharge: ED Unsafe disposition  Expected Discharge Plan and Services   Discharge Planning Services: CM Consult   Living arrangements for the past 2 months: Group Home                                       Social Determinants of Health (SDOH) Interventions SDOH Screenings   Food Insecurity: No Food Insecurity (08/19/2022)  Recent Concern: Food Insecurity - Food Insecurity Present (06/17/2022)  Housing: Low Risk  (08/19/2022)  Recent Concern: Housing - High Risk (06/17/2022)  Transportation Needs: Unmet Transportation Needs (08/19/2022)  Utilities: Not At Risk (08/19/2022)  Alcohol Screen: Low Risk  (08/19/2022)  Depression (PHQ2-9): Low Risk  (03/20/2021)  Financial Resource Strain: Low Risk  (03/20/2021)  Physical Activity: Insufficiently Active (03/20/2021)  Social Connections: Socially Isolated (03/20/2021)  Stress: No Stress Concern Present (03/20/2021)  Tobacco Use: High Risk (09/11/2023)    Readmission Risk Interventions     No data to display

## 2023-10-07 DIAGNOSIS — F411 Generalized anxiety disorder: Secondary | ICD-10-CM | POA: Diagnosis not present

## 2023-10-07 NOTE — ED Notes (Signed)
 Pt asked for phone to place a call

## 2023-10-07 NOTE — TOC Progression Note (Addendum)
 Transition of Care [facility]) - Progression Note    Patient Details  Name: Lance [name] MRN: [id number] Date of Birth: July 04, 1975  Transition of Care [facility]) CM/SW Contact  Arminda Landmark, RN Phone Number: 10/07/2023, 1:00 PM  Clinical Narrative:    Patient is scheduled to DC to [facility] tomorrow 4/25. Called and left a message for [name] asking about transportation for DC tomorrow.  1310: guardian responded that [facility] will pick him up tomorrow. 1615: Group home staff to pick him up tomorrow around 1000. TOC to continue to follow until DC.   Expected Discharge Plan: Group Home (psychiatric group home) Barriers to Discharge: ED Unsafe disposition  Expected Discharge Plan and Services   Discharge Planning Services: CM Consult   Living arrangements for the past 2 months: Group Home                                       Social Determinants of Health (SDOH) Interventions SDOH Screenings   Food Insecurity: No Food Insecurity (08/19/2022)  Recent Concern: Food Insecurity - Food Insecurity Present (06/17/2022)  Housing: Low Risk  (08/19/2022)  Recent Concern: Housing - High Risk (06/17/2022)  Transportation Needs: Unmet Transportation Needs (08/19/2022)  Utilities: Not At Risk (08/19/2022)  Alcohol Screen: Low Risk  (08/19/2022)  Depression (PHQ2-9): Low Risk  (03/20/2021)  Financial Resource Strain: Low Risk  (03/20/2021)  Physical Activity: Insufficiently Active (03/20/2021)  Social Connections: Socially Isolated (03/20/2021)  Stress: No Stress Concern Present (03/20/2021)  Tobacco Use: High Risk (09/11/2023)    Readmission Risk Interventions     No data to display

## 2023-10-07 NOTE — ED Notes (Signed)
Pt was provided with pm snack.

## 2023-10-07 NOTE — ED Notes (Signed)
 Pt given Furniture conservator/restorer now Completed

## 2023-10-07 NOTE — ED Notes (Signed)
 Pt Breakfast was provided at bedside

## 2023-10-07 NOTE — ED Notes (Signed)
 PT Dinner was provided at bedside

## 2023-10-07 NOTE — ED Notes (Signed)
 Pt Snack was provided

## 2023-10-07 NOTE — ED Notes (Signed)
Vol /pending placement 

## 2023-10-07 NOTE — ED Notes (Signed)
 Pt snack was provided

## 2023-10-08 DIAGNOSIS — F411 Generalized anxiety disorder: Secondary | ICD-10-CM | POA: Diagnosis not present

## 2023-10-08 MED ORDER — OLANZAPINE 20 MG PO TABS: 20.0000 mg | ORAL_TABLET | Freq: Every day | ORAL | 0 refills | Status: DC

## 2023-10-08 MED ORDER — SIMVASTATIN 40 MG PO TABS: 40.0000 mg | ORAL_TABLET | Freq: Every evening | ORAL | 0 refills | Status: DC

## 2023-10-08 MED ORDER — LITHIUM CARBONATE ER 450 MG PO TBCR: 450.0000 mg | EXTENDED_RELEASE_TABLET | Freq: Every day | ORAL | 0 refills | Status: DC

## 2023-10-08 MED ORDER — METFORMIN HCL 500 MG PO TABS: 1000.0000 mg | ORAL_TABLET | Freq: Two times a day (BID) | ORAL | 0 refills | Status: DC

## 2023-10-08 MED ORDER — MELATONIN 5 MG PO TABS: 5.0000 mg | ORAL_TABLET | Freq: Every day | ORAL | 0 refills | Status: AC

## 2023-10-08 MED ORDER — MELATONIN 5 MG PO TABS: 5.0000 mg | ORAL_TABLET | Freq: Every day | ORAL | 0 refills | Status: DC

## 2023-10-08 MED ORDER — SERTRALINE HCL 100 MG PO TABS: 100.0000 mg | ORAL_TABLET | Freq: Every day | ORAL | 0 refills | Status: DC

## 2023-10-08 MED ORDER — CLONAZEPAM 0.25 MG PO TBDP: 0.2500 mg | ORAL_TABLET | Freq: Two times a day (BID) | ORAL | 0 refills | Status: DC | PRN

## 2023-10-08 MED ORDER — PROPRANOLOL HCL 20 MG PO TABS: 20.0000 mg | ORAL_TABLET | Freq: Every day | ORAL | 0 refills | Status: DC

## 2023-10-08 NOTE — ED Notes (Addendum)
 Pt discharging to [facility]. Discharge teaching done and pt verbalized understanding. Teaching also done with [facility] staff that's picking up pt. Prescriptions reviewed and given to staff. Pt given all his personal belongings. Escorted to lobby, stable, ambulatory and in NAD.

## 2023-10-08 NOTE — ED Provider Notes (Signed)
 Emergency Medicine Observation Re-evaluation Note  Mayjor [name] is a 48 y.o. male, seen on rounds today.  Pt initially presented to the ED for complaints of Psychiatric Evaluation  Currently, the patient is calm, no acute complaints.  Physical Exam  Blood pressure 117/74, pulse (!) 58, temperature 97.9 F (36.6 C), temperature source Oral, resp. rate 17, height 5\' 10"  (1.778 m), weight 79.4 kg, SpO2 97%. Physical Exam General: NAD Lungs: CTAB Psych: not agitated  ED Course / MDM  EKG:    I have reviewed the labs performed to date as well as medications administered while in observation.  Recent changes in the last 24 hours include no acute events overnight.    Plan  Current plan is for discharge to group home today. New rx provided   Jacquie Maudlin, MD 10/08/23 1006

## 2023-10-08 NOTE — Progress Notes (Signed)
 Lance [name] is scheduled to DC to [facility] group home. This was arranged by [name]. [name] this morning and he's ready. Provided Perla Bradford, RN on the ED BHU the phone # for [facility]. He's scheduled for pick up today around 10:00 am by group home staff.

## 2023-10-13 ENCOUNTER — Inpatient Hospital Stay

## 2023-10-13 ENCOUNTER — Inpatient Hospital Stay: Attending: Internal Medicine | Admitting: Internal Medicine

## 2023-10-13 ENCOUNTER — Encounter: Payer: Self-pay | Admitting: Internal Medicine

## 2023-10-13 VITALS — BP 125/78 | HR 80 | Temp 98.5°F | Resp 12 | Ht 70.0 in | Wt 187.8 lb

## 2023-10-13 DIAGNOSIS — Z7901 Long term (current) use of anticoagulants: Secondary | ICD-10-CM | POA: Diagnosis not present

## 2023-10-13 DIAGNOSIS — D6862 Lupus anticoagulant syndrome: Secondary | ICD-10-CM | POA: Diagnosis not present

## 2023-10-13 DIAGNOSIS — Z86711 Personal history of pulmonary embolism: Secondary | ICD-10-CM

## 2023-10-13 DIAGNOSIS — R760 Raised antibody titer: Secondary | ICD-10-CM | POA: Diagnosis not present

## 2023-10-13 LAB — COMPREHENSIVE METABOLIC PANEL WITH GFR
ALT: 25 U/L (ref 0–44)
AST: 19 U/L (ref 15–41)
Albumin: 4.2 g/dL (ref 3.5–5.0)
Alkaline Phosphatase: 52 U/L (ref 38–126)
Anion gap: 8 (ref 5–15)
BUN: 17 mg/dL (ref 6–20)
CO2: 22 mmol/L (ref 22–32)
Calcium: 9.1 mg/dL (ref 8.9–10.3)
Chloride: 111 mmol/L (ref 98–111)
Creatinine, Ser: 1 mg/dL (ref 0.61–1.24)
GFR, Estimated: >60 mL/min (ref >60)
Glucose, Bld: 83 mg/dL (ref 70–99)
Potassium: 4.2 mmol/L (ref 3.5–5.1)
Sodium: 141 mmol/L (ref 135–145)
Total Bilirubin: 0.6 mg/dL (ref 0.0–1.2)
Total Protein: 6.8 g/dL (ref 6.5–8.1)

## 2023-10-13 LAB — CBC WITH DIFFERENTIAL/PLATELET
Abs Immature Granulocytes: 0.01 10*3/uL (ref 0.00–0.07)
Basophils Absolute: 0 10*3/uL (ref 0.0–0.1)
Basophils Relative: 0 %
Eosinophils Absolute: 0 10*3/uL (ref 0.0–0.5)
Eosinophils Relative: 0 %
HCT: 42.2 % (ref 39.0–52.0)
Hemoglobin: 14.3 g/dL (ref 13.0–17.0)
Immature Granulocytes: 0 %
Lymphocytes Relative: 34 %
Lymphs Abs: 1.4 10*3/uL (ref 0.7–4.0)
MCH: 31.6 pg (ref 26.0–34.0)
MCHC: 33.9 g/dL (ref 30.0–36.0)
MCV: 93.4 fL (ref 80.0–100.0)
Monocytes Absolute: 0.5 10*3/uL (ref 0.1–1.0)
Monocytes Relative: 12 %
Neutro Abs: 2.3 10*3/uL (ref 1.7–7.7)
Neutrophils Relative %: 54 %
Platelets: 190 10*3/uL (ref 150–400)
RBC: 4.52 MIL/uL (ref 4.22–5.81)
RDW: 12.3 % (ref 11.5–15.5)
WBC: 4.2 10*3/uL (ref 4.0–10.5)
nRBC: 0 % (ref 0.0–0.2)

## 2023-10-13 LAB — D-DIMER, QUANTITATIVE: D-Dimer, Quant: <0.27 ug{FEU}/mL (ref 0.00–0.50)

## 2023-10-13 NOTE — Assessment & Plan Note (Addendum)
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  Also reported history of "lupus anticoagulant"[Dr.Pandit-2013]-no records available.  Will review records/requested.  # # I would recommend further hypercoagulable workup-for any possible identifiable cause of blood clots-check D-dimer levels.  # I had a long discussion with the patient regarding the risk of bleeding while on anticoagulation.  Patient counseled regarding taking at most care to avoid falls/hitting head. The patient should inform us  or PCP of any bleeding episodes; or report to emergency room if any significant bleeding is noticed.  Thank you Dr.Jadali MD for allowing me to participate in the care of your pleasant patient. Please do not hesitate to contact me with questions or concerns in the interim.   # DISPOSITION: # Labs today-ordered # follow up in 2-3 weeks- MD: NO labs- Dr.B  # I reviewed the blood work- with the patient in detail; also reviewed the imaging independently [as summarized above]; and with the patient in detail. Also spoke to Manager at the Group Home re: the plan of care.   # DISPOSITION:  # labs- today- ordered.  # follow up 2-3 weeks- MD- no labs- Dr.B

## 2023-10-13 NOTE — Progress Notes (Signed)
 Pt with someone from group home. He can speak for himself. He doesn't know what meds or doses he's on, facility brought list of meds w/o the doses. Oneeka is calling facility to try to get a med list with doses.

## 2023-10-13 NOTE — Progress Notes (Signed)
 [facility] CONSULT NOTE  Patient Care Team: Annelle Kiel, MD as PCP - General (Internal Medicine) Gwyn Leos, MD as Consulting Physician (Oncology)  CHIEF COMPLAINTS/PURPOSE OF CONSULTATION: Hx of PE  #  Oncology History   No history exists.     PROCEDURE:     CT  - CT CHEST (FOR PE) W  - Dec 15 2008  1:12PM    RESULT:     History: Pleuritic chest pain.    Comparison studies: None.    Procedure and Findings: Standard IV contrast enhanced chest CT obtained  using 100 cc of Isovue 370. Thoracic aorta is normal. Pulmonary emboli are  present in the descending branches of the left pulmonary artery. Adrenals  are normal. Large airways patent. Left base atelectasis present.    IMPRESSION:      Positive study for pulmonary embolus.   HISTORY OF PRESENTING ILLNESS: Patient ambulating-independently. Accompanied by case worker- group home [[facility]; [place]]; poor historian.   Lance [name] 48 y.o.  male history of 2010 PE on Eliquis ; ? Lupus disorder [Dr.Pandit from 2013] and also schizophrenia has been referred to us  by PCP for further evaluation.  Patient has been referred to us  for further evaluation for the need for ongoing anticoagulation.  Patient has been diagnosed with PE approximately 15 years ago.  He does not remember any circumstances that led to the PE.  He does not recollect his medications.  Denies any blood in stools or black-colored stools.  Denies any falls.  Review of Systems  Constitutional:  Negative for chills, diaphoresis, fever, malaise/fatigue and weight loss.  HENT:  Negative for nosebleeds and sore throat.   Eyes:  Negative for double vision.  Respiratory:  Negative for cough, hemoptysis, sputum production, shortness of breath and wheezing.   Cardiovascular:  Negative for chest pain, palpitations, orthopnea and leg swelling.  Gastrointestinal:  Negative for abdominal pain, blood in stool, constipation, diarrhea,  heartburn, melena, nausea and vomiting.  Genitourinary:  Negative for dysuria, frequency and urgency.  Musculoskeletal:  Negative for back pain and joint pain.  Skin: Negative.  Negative for itching and rash.  Neurological:  Negative for dizziness, tingling, focal weakness, weakness and headaches.  Endo/Heme/Allergies:  Does not bruise/bleed easily.  Psychiatric/Behavioral:  Negative for depression. The patient is not nervous/anxious and does not have insomnia.      MEDICAL HISTORY:  Past Medical History:  Diagnosis Date   Depression    Diabetes mellitus without complication (HCC)    Hyperlipidemia    Hypertension    Lupus anticoagulant disorder (HCC) 06/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   PE (pulmonary thromboembolism) (HCC)    Schizo affective schizophrenia (HCC)    Supratherapeutic INR 11/19/2016    SURGICAL HISTORY: History reviewed. No pertinent surgical history.  SOCIAL HISTORY: Social History   Socioeconomic History   Marital status: Single    Spouse name: Not on file   Number of children: Not on file   Years of education: Not on file   Highest education level: Not on file  Occupational History   Not on file  Tobacco Use   Smoking status: Every Day    Current packs/day: 1.00    Average packs/day: 1 pack/day for 23.0 years (23.0 ttl pk-yrs)    Types: Cigarettes   Smokeless tobacco: Never   Tobacco comments:    will provide material  Vaping Use   Vaping status: Some Days  Substance and Sexual Activity  Alcohol use: Not Currently    Comment: occassionally   Drug use: Not Currently    Types: Marijuana    Comment: Last use 11/29/16   Sexual activity: Not Currently    Birth control/protection: None    Comment: occasional marijuana- none recently  Other Topics Concern   Not on file  Social History Narrative   From a group home in [place]   Social Drivers of Health   Financial Resource Strain: Low Risk  (03/20/2021)   Overall Financial  Resource Strain (CARDIA)    Difficulty of Paying Living Expenses: Not very hard  Food Insecurity: No Food Insecurity (08/19/2022)   Hunger Vital Sign    Worried About Running Out of Food in the Last Year: Never true    Ran Out of Food in the Last Year: Never true  Recent Concern: Food Insecurity - Food Insecurity Present (06/17/2022)   Hunger Vital Sign    Worried About Running Out of Food in the Last Year: Sometimes true    Ran Out of Food in the Last Year: Sometimes true  Transportation Needs: Unmet Transportation Needs (08/19/2022)   PRAPARE - Transportation    Lack of Transportation (Medical): Yes    Lack of Transportation (Non-Medical): Yes  Physical Activity: Insufficiently Active (03/20/2021)   Exercise Vital Sign    Days of Exercise per Week: 3 days    Minutes of Exercise per Session: 20 min  Stress: No Stress Concern Present (03/20/2021)   [employer] of Occupational Health - Occupational Stress Questionnaire    Feeling of Stress : Only a little  Social Connections: Socially Isolated (03/20/2021)   Social Connection and Isolation Panel [NHANES]    Frequency of Communication with Friends and Family: More than three times a week    Frequency of Social Gatherings with Friends and Family: More than three times a week    Attends Religious Services: Never    Database administrator or Organizations: No    Attends Banker Meetings: Never    Marital Status: Never married  Intimate Partner Violence: Not At Risk (08/19/2022)   Humiliation, Afraid, Rape, and Kick questionnaire    Fear of Current or Ex-Partner: No    Emotionally Abused: No    Physically Abused: No    Sexually Abused: No    FAMILY HISTORY: Family History  Problem Relation Age of Onset   CAD Mother    Rheum arthritis Mother    Healthy Father    CAD Sister    Obesity Sister     ALLERGIES:  is allergic to penicillins.  MEDICATIONS:  Current Outpatient Medications  Medication Sig Dispense Refill    clonazePAM  (KLONOPIN ) 0.25 MG disintegrating tablet Take 1 tablet (0.25 mg total) by mouth 2 (two) times daily as needed for seizure. 60 tablet 0   insulin  aspart (NOVOLOG ) 100 UNIT/ML injection Inject 14 Units into the skin once.     lisinopril  (ZESTRIL ) 2.5 MG tablet Take 1 tablet (2.5 mg total) by mouth daily. 30 tablet 1   lithium  carbonate (ESKALITH ) 450 MG ER tablet Take 1 tablet (450 mg total) by mouth at bedtime. 30 tablet 1   LORAZEPAM  PO Take by mouth.     melatonin 5 MG TABS Take 1 tablet (5 mg total) by mouth at bedtime. 30 tablet 0   metFORMIN  (GLUCOPHAGE ) 500 MG tablet Take 2 tablets (1,000 mg total) by mouth 2 (two) times daily with a meal. 120 tablet 0   OLANZapine  (ZYPREXA ) 20 MG tablet  Take 1 tablet (20 mg total) by mouth at bedtime. 30 tablet 0   propranolol  (INDERAL ) 20 MG tablet Take 1 tablet (20 mg total) by mouth at bedtime. 30 tablet 0   sertraline  (ZOLOFT ) 100 MG tablet Take 1 tablet (100 mg total) by mouth daily. 30 tablet 0   simvastatin  (ZOCOR ) 40 MG tablet Take 1 tablet (40 mg total) by mouth every evening. 30 tablet 0   apixaban  (ELIQUIS ) 5 MG TABS tablet Take 1 tablet (5 mg total) by mouth 2 (two) times daily. (Patient not taking: Reported on 09/11/2023) 60 tablet 1   blood glucose meter kit and supplies Dispense based on patient and insurance preference. Use up to four times daily as directed. (FOR ICD-10 E10.9, E11.9). (Patient not taking: Reported on 09/11/2023) 1 each 6   clonazePAM  (KLONOPIN ) 0.25 MG disintegrating tablet Take 1 tablet (0.25 mg total) by mouth 2 (two) times daily as needed (anxiety). (Patient not taking: Reported on 09/11/2023) 60 tablet 1   clotrimazole  (CLOTRIMAZOLE  ANTI-FUNGAL) 1 % cream Apply 1 Application topically 2 (two) times daily. (Patient not taking: Reported on 09/11/2023) 30 g 0   cloZAPine  (CLOZARIL ) 50 MG tablet Take 5 tablets (250 mg total) by mouth at bedtime. (Patient not taking: Reported on 09/11/2023) 150 tablet 0    fluticasone -salmeterol (ADVAIR) 250-50 MCG/ACT AEPB Inhale 1 puff into the lungs 2 (two) times daily. (Patient not taking: Reported on 09/11/2023) 60 each 1   glucose blood (FORA V30A BLOOD GLUCOSE TEST) test strip CHECK BLOOD SUGAR TWICE DAILY. (Patient not taking: Reported on 09/11/2023) 100 strip 3   insulin  detemir (LEVEMIR ) 100 UNIT/ML injection Inject 0.1 mLs (10 Units total) into the skin 2 (two) times daily. (Patient not taking: Reported on 09/11/2023) 10 mL 1   lithium  carbonate (ESKALITH ) 450 MG ER tablet Take 1 tablet (450 mg total) by mouth at bedtime. (Patient not taking: Reported on 10/13/2023) 30 tablet 0   metFORMIN  (GLUCOPHAGE ) 1000 MG tablet Take 1 tablet (1,000 mg total) by mouth 2 (two) times daily with a meal. (Patient not taking: Reported on 10/13/2023) 60 tablet 1   NOVOFINE AUTOCOVER PEN NEEDLE 30G X 8 MM MISC USE WITH LEVEMIR  TWICE DAILY. (Patient not taking: No sig reported) 100 each 11   OLANZapine  (ZYPREXA ) 20 MG tablet Take 1 tablet (20 mg total) by mouth at bedtime. (Patient not taking: Reported on 10/13/2023) 30 tablet 1   propranolol  (INDERAL ) 20 MG tablet Take 1 tablet (20 mg total) by mouth at bedtime. (Patient not taking: Reported on 10/13/2023) 30 tablet 1   sertraline  (ZOLOFT ) 100 MG tablet Take 1 tablet (100 mg total) by mouth daily. (Patient not taking: Reported on 10/13/2023) 30 tablet 1   simvastatin  (ZOCOR ) 40 MG tablet Take 1 tablet (40 mg total) by mouth daily at 6 PM. (Patient not taking: Reported on 10/13/2023) 30 tablet 1   traZODone  (DESYREL ) 100 MG tablet Take 1 tablet (100 mg total) by mouth at bedtime. (Patient not taking: Reported on 09/11/2023) 30 tablet 1   No current facility-administered medications for this visit.       PHYSICAL EXAMINATION:  Vitals:   10/13/23 1114  BP: 125/78  Pulse: 80  Resp: 12  Temp: 98.5 F (36.9 C)  SpO2: 99%   Filed Weights   10/13/23 1114  Weight: 187 lb 12.8 oz (85.2 kg)    Physical Exam Vitals and nursing  note reviewed.  HENT:     Head: Normocephalic and atraumatic.     Mouth/Throat:  Pharynx: Oropharynx is clear.  Eyes:     Extraocular Movements: Extraocular movements intact.     Pupils: Pupils are equal, round, and reactive to light.  Cardiovascular:     Rate and Rhythm: Normal rate and regular rhythm.  Pulmonary:     Comments: Decreased breath sounds bilaterally.  Abdominal:     Palpations: Abdomen is soft.  Musculoskeletal:        General: Normal range of motion.     Cervical back: Normal range of motion.  Skin:    General: Skin is warm.  Neurological:     General: No focal deficit present.     Mental Status: He is alert and oriented to person, place, and time.  Psychiatric:        Behavior: Behavior normal.        Judgment: Judgment normal.      LABORATORY DATA:  I have reviewed the data as listed Lab Results  Component Value Date   WBC 4.2 10/13/2023   HGB 14.3 10/13/2023   HCT 42.2 10/13/2023   MCV 93.4 10/13/2023   PLT 190 10/13/2023   Recent Labs    09/10/23 2100 10/13/23 1158  NA 140 141  K 4.0 4.2  CL 111 111  CO2 21* 22  GLUCOSE 163* 83  BUN 20 17  CREATININE 1.09 1.00  CALCIUM 9.3 9.1  GFRNONAA >60 >60  PROT 6.6 6.8  ALBUMIN 4.3 4.2  AST 20 19  ALT 20 25  ALKPHOS 64 52  BILITOT 0.5 0.6    RADIOGRAPHIC STUDIES: I have personally reviewed the radiological images as listed and agreed with the findings in the report. No results found.  ASSESSMENT & PLAN:   Hx of pulmonary embolus # 2010-history of bilateral PE-currently on Eliquis .  Also reported history of "lupus anticoagulant"[Dr.Pandit-2013]-no records available.  Will review records/requested.  # # I would recommend further hypercoagulable workup-for any possible identifiable cause of blood clots-check D-dimer levels.  # I had a long discussion with the patient regarding the risk of bleeding while on anticoagulation.  Patient counseled regarding taking at most care to avoid  falls/hitting head. The patient should inform us  or PCP of any bleeding episodes; or report to emergency room if any significant bleeding is noticed.  Thank you Dr.Jadali MD for allowing me to participate in the care of your pleasant patient. Please do not hesitate to contact me with questions or concerns in the interim.   # DISPOSITION: # Labs today-ordered # follow up in 2-3 weeks- MD: NO labs- Dr.B  # I reviewed the blood work- with the patient in detail; also reviewed the imaging independently [as summarized above]; and with the patient in detail. Also spoke to Manager at the Group Home re: the plan of care.   # DISPOSITION:  # labs- today- ordered.  # follow up 2-3 weeks- MD- no labs- Dr.B     Gwyn Leos, MD 10/13/2023 2:21 PM

## 2023-10-14 LAB — PROTEIN C ACTIVITY: Protein C Activity: 94 % (ref 73–180)

## 2023-10-14 LAB — BETA-2-GLYCOPROTEIN I ABS, IGG/M/A
Beta-2 Glyco I IgG: <9 GPI IgG units (ref 0–20)
Beta-2-Glycoprotein I IgA: <9 GPI IgA units (ref 0–25)
Beta-2-Glycoprotein I IgM: <9 GPI IgM units (ref 0–32)

## 2023-10-14 LAB — PROTEIN S ACTIVITY: Protein S Activity: 83 % (ref 63–140)

## 2023-10-15 LAB — DRVVT MIX: dRVVT Mix: 44.8 s — ABNORMAL HIGH (ref 0.0–40.4)

## 2023-10-15 LAB — ANTIPHOSPHOLIPID SYNDROME PROF
Anticardiolipin IgG: <9 GPL U/mL (ref 0–14)
Anticardiolipin IgM: 17 [MPL'U]/mL — ABNORMAL HIGH (ref 0–12)
DRVVT: 55.5 s — ABNORMAL HIGH (ref 0.0–47.0)
PTT Lupus Anticoagulant: 35 s (ref 0.0–43.5)

## 2023-10-15 LAB — DRVVT CONFIRM: dRVVT Confirm: 1.4 ratio — ABNORMAL HIGH (ref 0.8–1.2)

## 2023-10-18 LAB — FACTOR 5 LEIDEN

## 2023-10-18 LAB — PROTHROMBIN GENE MUTATION

## 2023-10-25 ENCOUNTER — Encounter: Payer: Self-pay | Admitting: Internal Medicine

## 2023-10-25 ENCOUNTER — Inpatient Hospital Stay: Attending: Internal Medicine | Admitting: Internal Medicine

## 2023-10-25 VITALS — BP 127/80 | HR 84 | Temp 97.4°F | Resp 12 | Ht 70.0 in | Wt 184.0 lb

## 2023-10-25 DIAGNOSIS — Z7901 Long term (current) use of anticoagulants: Secondary | ICD-10-CM | POA: Diagnosis not present

## 2023-10-25 DIAGNOSIS — D6862 Lupus anticoagulant syndrome: Secondary | ICD-10-CM | POA: Diagnosis not present

## 2023-10-25 DIAGNOSIS — Z86711 Personal history of pulmonary embolism: Secondary | ICD-10-CM

## 2023-10-25 NOTE — Progress Notes (Signed)
 Pt wants to know if he is coming off eliquis ?  Can you rx trazodone  for sleep? Pended is so.

## 2023-10-25 NOTE — Assessment & Plan Note (Addendum)
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  Also reported history of "lupus anticoagulant"[Dr.Pandit-2013]-no records available.  APRIL 2025- hypercoagulable workup-suggestive of antiphospholipid antibody syndrome [positive lupus anticoagulant; anticardiolipin antibody positive].   # Recommend indefinite anticoagulation with Eliquis .  Patient a poor candidate for any Coumadin .  # Schizophrenia/anxiety-defer to PCP psychiatry.  # #Since patient is clinically stable I think is reasonable for the patient to follow-up with PCP/can follow-up with us  as needed.  Patient comfortable with the plan; to call us  if any questions or concerns in the interim.  Also discussed with the patient's caregiver.  # DISPOSITION:  # follow up AS needed- Dr.B  Dr.Jadali

## 2023-10-25 NOTE — Progress Notes (Signed)
 [facility] CONSULT NOTE  Patient Care Team: Annelle Kiel, MD as PCP - General (Internal Medicine) Gwyn Leos, MD as Consulting Physician (Oncology)  CHIEF COMPLAINTS/PURPOSE OF CONSULTATION: Hx of PE  #  Oncology History   No history exists.     PROCEDURE:     CT  - CT CHEST (FOR PE) W  - Dec 15 2008  1:12PM    RESULT:     History: Pleuritic chest pain.    Comparison studies: None.    Procedure and Findings: Standard IV contrast enhanced chest CT obtained  using 100 cc of Isovue 370. Thoracic aorta is normal. Pulmonary emboli are  present in the descending branches of the left pulmonary artery. Adrenals  are normal. Large airways patent. Left base atelectasis present.    IMPRESSION:      Positive study for pulmonary embolus.   HISTORY OF PRESENTING ILLNESS: Patient ambulating-independently. Accompanied by case worker- group home [truly grateful; [facility]]; poor historian.   Lance [name] 48 y.o.  male history of 2010 PE on Eliquis ; ? Lupus disorder [Dr.Pandit from 2013] and also schizophrenia has been referred to us  by PCP for further evaluation/workup for hypercoagulable state.  Denies any blood in stools or black-colored stools.  Denies any falls.  Review of Systems  Constitutional:  Negative for chills, diaphoresis, fever, malaise/fatigue and weight loss.  HENT:  Negative for nosebleeds and sore throat.   Eyes:  Negative for double vision.  Respiratory:  Negative for cough, hemoptysis, sputum production, shortness of breath and wheezing.   Cardiovascular:  Negative for chest pain, palpitations, orthopnea and leg swelling.  Gastrointestinal:  Negative for abdominal pain, blood in stool, constipation, diarrhea, heartburn, melena, nausea and vomiting.  Genitourinary:  Negative for dysuria, frequency and urgency.  Musculoskeletal:  Negative for back pain and joint pain.  Skin: Negative.  Negative for itching and rash.  Neurological:  Negative  for dizziness, tingling, focal weakness, weakness and headaches.  Endo/Heme/Allergies:  Does not bruise/bleed easily.  Psychiatric/Behavioral:  Negative for depression. The patient is not nervous/anxious and does not have insomnia.      MEDICAL HISTORY:  Past Medical History:  Diagnosis Date   Depression    Diabetes mellitus without complication (HCC)    Hyperlipidemia    Hypertension    Lupus anticoagulant disorder (HCC) 06/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   PE (pulmonary thromboembolism) (HCC)    Schizo affective schizophrenia (HCC)    Supratherapeutic INR 11/19/2016    SURGICAL HISTORY: History reviewed. No pertinent surgical history.  SOCIAL HISTORY: Social History   Socioeconomic History   Marital status: Single    Spouse name: Not on file   Number of children: Not on file   Years of education: Not on file   Highest education level: Not on file  Occupational History   Not on file  Tobacco Use   Smoking status: Every Day    Current packs/day: 1.00    Average packs/day: 1 pack/day for 23.0 years (23.0 ttl pk-yrs)    Types: Cigarettes   Smokeless tobacco: Never   Tobacco comments:    will provide material  Vaping Use   Vaping status: Some Days  Substance and Sexual Activity   Alcohol use: Not Currently    Comment: occassionally   Drug use: Not Currently    Types: Marijuana    Comment: Last use 11/29/16   Sexual activity: Not Currently    Birth control/protection: None  Comment: occasional marijuana- none recently  Other Topics Concern   Not on file  Social History Narrative   From a group home in [place]   Social Drivers of Health   Financial Resource Strain: Low Risk  (03/20/2021)   Overall Financial Resource Strain (CARDIA)    Difficulty of Paying Living Expenses: Not very hard  Food Insecurity: No Food Insecurity (08/19/2022)   Hunger Vital Sign    Worried About Running Out of Food in the Last Year: Never true    Ran Out of Food in  the Last Year: Never true  Recent Concern: Food Insecurity - Food Insecurity Present (06/17/2022)   Hunger Vital Sign    Worried About Running Out of Food in the Last Year: Sometimes true    Ran Out of Food in the Last Year: Sometimes true  Transportation Needs: Unmet Transportation Needs (08/19/2022)   PRAPARE - Administrator, Civil Service (Medical): Yes    Lack of Transportation (Non-Medical): Yes  Physical Activity: Insufficiently Active (03/20/2021)   Exercise Vital Sign    Days of Exercise per Week: 3 days    Minutes of Exercise per Session: 20 min  Stress: No Stress Concern Present (03/20/2021)   [employer] of Occupational Health - Occupational Stress Questionnaire    Feeling of Stress : Only a little  Social Connections: Socially Isolated (03/20/2021)   Social Connection and Isolation Panel [NHANES]    Frequency of Communication with Friends and Family: More than three times a week    Frequency of Social Gatherings with Friends and Family: More than three times a week    Attends Religious Services: Never    Database administrator or Organizations: No    Attends Banker Meetings: Never    Marital Status: Never married  Intimate Partner Violence: Not At Risk (08/19/2022)   Humiliation, Afraid, Rape, and Kick questionnaire    Fear of Current or Ex-Partner: No    Emotionally Abused: No    Physically Abused: No    Sexually Abused: No    FAMILY HISTORY: Family History  Problem Relation Age of Onset   CAD Mother    Rheum arthritis Mother    Healthy Father    CAD Sister    Obesity Sister     ALLERGIES:  is allergic to penicillins.  MEDICATIONS:  Current Outpatient Medications  Medication Sig Dispense Refill   apixaban  (ELIQUIS ) 5 MG TABS tablet Take 1 tablet (5 mg total) by mouth 2 (two) times daily. 60 tablet 1   clonazePAM  (KLONOPIN ) 0.25 MG disintegrating tablet Take 1 tablet (0.25 mg total) by mouth 2 (two) times daily as needed for seizure.  60 tablet 0   clotrimazole  (CLOTRIMAZOLE  ANTI-FUNGAL) 1 % cream Apply 1 Application topically 2 (two) times daily. 30 g 0   cloZAPine  (CLOZARIL ) 50 MG tablet Take 5 tablets (250 mg total) by mouth at bedtime. 150 tablet 0   fluticasone -salmeterol (ADVAIR) 250-50 MCG/ACT AEPB Inhale 1 puff into the lungs 2 (two) times daily. 60 each 1   insulin  detemir (LEVEMIR ) 100 UNIT/ML injection Inject 0.1 mLs (10 Units total) into the skin 2 (two) times daily. 10 mL 1   lisinopril  (ZESTRIL ) 2.5 MG tablet Take 1 tablet (2.5 mg total) by mouth daily. 30 tablet 1   lithium  carbonate (ESKALITH ) 450 MG ER tablet Take 1 tablet (450 mg total) by mouth at bedtime. 30 tablet 1   LORAZEPAM  PO Take by mouth.     melatonin 5  MG TABS Take 1 tablet (5 mg total) by mouth at bedtime. 30 tablet 0   metFORMIN  (GLUCOPHAGE ) 500 MG tablet Take 2 tablets (1,000 mg total) by mouth 2 (two) times daily with a meal. 120 tablet 0   OLANZapine  (ZYPREXA ) 20 MG tablet Take 1 tablet (20 mg total) by mouth at bedtime. 30 tablet 0   propranolol  (INDERAL ) 20 MG tablet Take 1 tablet (20 mg total) by mouth at bedtime. 30 tablet 0   sertraline  (ZOLOFT ) 100 MG tablet Take 1 tablet (100 mg total) by mouth daily. 30 tablet 0   simvastatin  (ZOCOR ) 40 MG tablet Take 1 tablet (40 mg total) by mouth every evening. 30 tablet 0   insulin  aspart (NOVOLOG ) 100 UNIT/ML injection Inject 14 Units into the skin once. (Patient not taking: Reported on 10/25/2023)     lithium  carbonate (ESKALITH ) 450 MG ER tablet Take 1 tablet (450 mg total) by mouth at bedtime. (Patient not taking: Reported on 10/25/2023) 30 tablet 0   metFORMIN  (GLUCOPHAGE ) 1000 MG tablet Take 1 tablet (1,000 mg total) by mouth 2 (two) times daily with a meal. (Patient not taking: Reported on 10/25/2023) 60 tablet 1   NOVOFINE AUTOCOVER PEN NEEDLE 30G X 8 MM MISC USE WITH LEVEMIR  TWICE DAILY. (Patient not taking: No sig reported) 100 each 11   OLANZapine  (ZYPREXA ) 20 MG tablet Take 1 tablet (20 mg  total) by mouth at bedtime. (Patient not taking: Reported on 10/13/2023) 30 tablet 1   propranolol  (INDERAL ) 20 MG tablet Take 1 tablet (20 mg total) by mouth at bedtime. (Patient not taking: Reported on 10/13/2023) 30 tablet 1   sertraline  (ZOLOFT ) 100 MG tablet Take 1 tablet (100 mg total) by mouth daily. (Patient not taking: Reported on 10/13/2023) 30 tablet 1   simvastatin  (ZOCOR ) 40 MG tablet Take 1 tablet (40 mg total) by mouth daily at 6 PM. (Patient not taking: Reported on 10/13/2023) 30 tablet 1   traZODone  (DESYREL ) 100 MG tablet Take 1 tablet (100 mg total) by mouth at bedtime. (Patient not taking: Reported on 09/11/2023) 30 tablet 1   No current facility-administered medications for this visit.       PHYSICAL EXAMINATION:  Vitals:   10/25/23 1512  BP: 127/80  Pulse: 84  Resp: 12  Temp: (!) 97.4 F (36.3 C)  SpO2: 99%   Filed Weights   10/25/23 1512  Weight: 184 lb (83.5 kg)    Physical Exam Vitals and nursing note reviewed.  HENT:     Head: Normocephalic and atraumatic.     Mouth/Throat:     Pharynx: Oropharynx is clear.  Eyes:     Extraocular Movements: Extraocular movements intact.     Pupils: Pupils are equal, round, and reactive to light.  Cardiovascular:     Rate and Rhythm: Normal rate and regular rhythm.  Pulmonary:     Comments: Decreased breath sounds bilaterally.  Abdominal:     Palpations: Abdomen is soft.  Musculoskeletal:        General: Normal range of motion.     Cervical back: Normal range of motion.  Skin:    General: Skin is warm.  Neurological:     General: No focal deficit present.     Mental Status: He is alert and oriented to person, place, and time.  Psychiatric:        Behavior: Behavior normal.        Judgment: Judgment normal.      LABORATORY DATA:  I have reviewed the  data as listed Lab Results  Component Value Date   WBC 4.2 10/13/2023   HGB 14.3 10/13/2023   HCT 42.2 10/13/2023   MCV 93.4 10/13/2023   PLT 190  10/13/2023   Recent Labs    09/10/23 2100 10/13/23 1158  NA 140 141  K 4.0 4.2  CL 111 111  CO2 21* 22  GLUCOSE 163* 83  BUN 20 17  CREATININE 1.09 1.00  CALCIUM 9.3 9.1  GFRNONAA >60 >60  PROT 6.6 6.8  ALBUMIN 4.3 4.2  AST 20 19  ALT 20 25  ALKPHOS 64 52  BILITOT 0.5 0.6    RADIOGRAPHIC STUDIES: I have personally reviewed the radiological images as listed and agreed with the findings in the report. No results found.  ASSESSMENT & PLAN:   Hx of pulmonary embolus # 2010-history of bilateral PE-currently on Eliquis .  Also reported history of "lupus anticoagulant"[Dr.Pandit-2013]-no records available.  APRIL 2025- hypercoagulable workup-suggestive of antiphospholipid antibody syndrome [positive lupus anticoagulant; anticardiolipin antibody positive].   # Recommend indefinite anticoagulation with Eliquis .  Patient a poor candidate for any Coumadin .  # Schizophrenia/anxiety-defer to PCP psychiatry.  # #Since patient is clinically stable I think is reasonable for the patient to follow-up with PCP/can follow-up with us  as needed.  Patient comfortable with the plan; to call us  if any questions or concerns in the interim.  Also discussed with the patient's caregiver.  # DISPOSITION:  # follow up AS needed- Dr.B  Dr.Jadali      Gwyn Leos, MD 10/25/2023 3:43 PM

## 2023-12-31 ENCOUNTER — Encounter: Payer: Self-pay | Admitting: Podiatry

## 2023-12-31 ENCOUNTER — Ambulatory Visit (INDEPENDENT_AMBULATORY_CARE_PROVIDER_SITE_OTHER): Admitting: Podiatry

## 2023-12-31 DIAGNOSIS — Q828 Other specified congenital malformations of skin: Secondary | ICD-10-CM | POA: Diagnosis not present

## 2023-12-31 DIAGNOSIS — E119 Type 2 diabetes mellitus without complications: Secondary | ICD-10-CM | POA: Diagnosis not present

## 2023-12-31 DIAGNOSIS — M79676 Pain in unspecified toe(s): Secondary | ICD-10-CM | POA: Diagnosis not present

## 2023-12-31 DIAGNOSIS — B351 Tinea unguium: Secondary | ICD-10-CM | POA: Diagnosis not present

## 2024-01-07 NOTE — Progress Notes (Signed)
  Subjective:  Patient ID: Lance [name], male    DOB: 01/21/1976,  MRN: [id number]  48 y.o. male presents to clinic with  preventative diabetic foot care and painful porokeratotic lesion(s) right foot and painful mycotic toenails that limit ambulation. Painful toenails interfere with ambulation. Aggravating factors include wearing enclosed shoe gear. Pain is relieved with periodic professional debridement. Painful porokeratotic lesions are aggravated when weightbearing with and without shoegear. Pain is relieved with periodic professional debridement.  Chief Complaint  Patient presents with   [facility]    Rm4 Diabetic Dr. Weyman   New problem(s): None   PCP is Jadali, Fayegh, MD.  Allergies  Allergen Reactions   Penicillins Other (See Comments)    Reaction: lockjaw as teen Tolerated Augmentin /Unasyn  06/09/22     Review of Systems: Negative except as noted in the HPI.   Objective:  Lance [name] is a pleasant 48 y.o. male in NAD. AAO x 3.  Vascular Examination: Vascular status intact b/l with palpable pedal pulses. CFT immediate b/l. No edema. No pain with calf compression b/l. Skin temperature gradient WNL b/l.   Neurological Examination: Sensation grossly intact b/l with 10 gram monofilament. Vibratory sensation intact b/l.   Dermatological Examination: Pedal skin with normal turgor, texture and tone b/l. Toenails 1-5 b/l thick, discolored, elongated with subungual debris and pain on dorsal palpation. Porokeratotic lesion(s) submet head 4 right foot. No erythema, no edema, no drainage, no fluctuance.  Musculoskeletal Examination: Muscle strength 5/5 to b/l LE. Plantarflexed metatarsal(s) 4th metatarsal head right foot.  Radiographs: None  Last A1c:      Latest Ref Rng & Units 09/10/2023    9:00 PM  Hemoglobin A1C  Hemoglobin-A1c 4.8 - 5.6 % 4.9      Assessment:   1. Pain due to onychomycosis of toenail   2. Porokeratosis   3. Diabetes mellitus without  complication (HCC)    Plan:  Patient was evaluated and treated. All patient's and/or POA's questions/concerns addressed on today's visit. Toenails 1-5 debrided in length and girth without incident. Porokeratotic lesion(s) submet head 4 right foot pared with sharp debridement without incident. Continue soft, supportive shoe gear daily. Report any pedal injuries to medical professional. Call office if there are any questions/concerns. Patient/POA to call should there be question/concern in the interim.  Return in about 3 months (around 04/01/2024).  Delon LITTIE Merlin, DPM      [facility] LOCATION: [address], [place] [address]                   Office [phone]   [facility] LOCATION: [address] [place], [place] [address] Office [phone]

## 2024-02-06 ENCOUNTER — Emergency Department
Admission: EM | Admit: 2024-02-06 | Discharge: 2024-02-06 | Disposition: A | Discharge status: Against Medical Advice | Attending: Emergency Medicine | Admitting: Emergency Medicine

## 2024-02-06 ENCOUNTER — Other Ambulatory Visit: Payer: Self-pay

## 2024-02-06 DIAGNOSIS — K047 Periapical abscess without sinus: Secondary | ICD-10-CM | POA: Insufficient documentation

## 2024-02-06 DIAGNOSIS — Z5321 Procedure and treatment not carried out due to patient leaving prior to being seen by health care provider: Secondary | ICD-10-CM | POA: Insufficient documentation

## 2024-02-06 LAB — BASIC METABOLIC PANEL WITH GFR
Anion gap: 10 (ref 5–15)
BUN: 19 mg/dL (ref 6–20)
CO2: 20 mmol/L — ABNORMAL LOW (ref 22–32)
Calcium: 9.6 mg/dL (ref 8.9–10.3)
Chloride: 111 mmol/L (ref 98–111)
Creatinine, Ser: 1.11 mg/dL (ref 0.61–1.24)
GFR, Estimated: >60 mL/min (ref >60)
Glucose, Bld: 160 mg/dL — ABNORMAL HIGH (ref 70–99)
Potassium: 4.3 mmol/L (ref 3.5–5.1)
Sodium: 141 mmol/L (ref 135–145)

## 2024-02-06 LAB — CBC WITH DIFFERENTIAL/PLATELET
Abs Immature Granulocytes: 0.04 K/uL (ref 0.00–0.07)
Basophils Absolute: 0 K/uL (ref 0.0–0.1)
Basophils Relative: 0 %
Eosinophils Absolute: 0 K/uL (ref 0.0–0.5)
Eosinophils Relative: 0 %
HCT: 38.6 % — ABNORMAL LOW (ref 39.0–52.0)
Hemoglobin: 13.2 g/dL (ref 13.0–17.0)
Immature Granulocytes: 1 %
Lymphocytes Relative: 25 %
Lymphs Abs: 1.4 K/uL (ref 0.7–4.0)
MCH: 32 pg (ref 26.0–34.0)
MCHC: 34.2 g/dL (ref 30.0–36.0)
MCV: 93.7 fL (ref 80.0–100.0)
Monocytes Absolute: 0.7 K/uL (ref 0.1–1.0)
Monocytes Relative: 12 %
Neutro Abs: 3.5 K/uL (ref 1.7–7.7)
Neutrophils Relative %: 62 %
Platelets: 187 K/uL (ref 150–400)
RBC: 4.12 MIL/uL — ABNORMAL LOW (ref 4.22–5.81)
RDW: 12.5 % (ref 11.5–15.5)
WBC: 5.6 K/uL (ref 4.0–10.5)
nRBC: 0 % (ref 0.0–0.2)

## 2024-02-06 NOTE — ED Triage Notes (Signed)
 Pt to ED with caregiver for abscess and R lower gum pain since 6 months and has been treated with abx. R side of face slightly swollen compared to L. Pt lives at [facility] group home, [address].

## 2024-03-10 ENCOUNTER — Other Ambulatory Visit: Payer: Self-pay

## 2024-03-10 ENCOUNTER — Emergency Department
Admission: EM | Admit: 2024-03-10 | Discharge: 2024-03-12 | Disposition: A | Discharge status: Other Acute Inpatient Hospital | Attending: Emergency Medicine | Admitting: Emergency Medicine

## 2024-03-10 DIAGNOSIS — R440 Auditory hallucinations: Secondary | ICD-10-CM

## 2024-03-10 DIAGNOSIS — F29 Unspecified psychosis not due to a substance or known physiological condition: Secondary | ICD-10-CM | POA: Insufficient documentation

## 2024-03-10 DIAGNOSIS — E119 Type 2 diabetes mellitus without complications: Secondary | ICD-10-CM | POA: Insufficient documentation

## 2024-03-10 DIAGNOSIS — Z7901 Long term (current) use of anticoagulants: Secondary | ICD-10-CM | POA: Insufficient documentation

## 2024-03-10 DIAGNOSIS — F209 Schizophrenia, unspecified: Secondary | ICD-10-CM | POA: Diagnosis not present

## 2024-03-10 LAB — URINE DRUG SCREEN, QUALITATIVE (ARMC ONLY)
Amphetamines, Ur Screen: NOT DETECTED
Barbiturates, Ur Screen: NOT DETECTED
Benzodiazepine, Ur Scrn: NOT DETECTED
Cannabinoid 50 Ng, Ur ~~LOC~~: NOT DETECTED
Cocaine Metabolite,Ur ~~LOC~~: NOT DETECTED
MDMA (Ecstasy)Ur Screen: NOT DETECTED
Methadone Scn, Ur: NOT DETECTED
Opiate, Ur Screen: NOT DETECTED
Phencyclidine (PCP) Ur S: NOT DETECTED
Tricyclic, Ur Screen: NOT DETECTED

## 2024-03-10 LAB — COMPREHENSIVE METABOLIC PANEL WITH GFR
ALT: 26 U/L (ref 0–44)
AST: 27 U/L (ref 15–41)
Albumin: 4.7 g/dL (ref 3.5–5.0)
Alkaline Phosphatase: 60 U/L (ref 38–126)
Anion gap: 12 (ref 5–15)
BUN: 30 mg/dL — ABNORMAL HIGH (ref 6–20)
CO2: 19 mmol/L — ABNORMAL LOW (ref 22–32)
Calcium: 9.8 mg/dL (ref 8.9–10.3)
Chloride: 109 mmol/L (ref 98–111)
Creatinine, Ser: 1.37 mg/dL — ABNORMAL HIGH (ref 0.61–1.24)
GFR, Estimated: >60 mL/min (ref >60)
Glucose, Bld: 180 mg/dL — ABNORMAL HIGH (ref 70–99)
Potassium: 4 mmol/L (ref 3.5–5.1)
Sodium: 140 mmol/L (ref 135–145)
Total Bilirubin: 0.8 mg/dL (ref 0.0–1.2)
Total Protein: 7.8 g/dL (ref 6.5–8.1)

## 2024-03-10 LAB — CBC WITH DIFFERENTIAL/PLATELET
Abs Immature Granulocytes: 0.02 K/uL (ref 0.00–0.07)
Basophils Absolute: 0 K/uL (ref 0.0–0.1)
Basophils Relative: 0 %
Eosinophils Absolute: 0 K/uL (ref 0.0–0.5)
Eosinophils Relative: 0 %
HCT: 42.8 % (ref 39.0–52.0)
Hemoglobin: 14.8 g/dL (ref 13.0–17.0)
Immature Granulocytes: 0 %
Lymphocytes Relative: 23 %
Lymphs Abs: 1.5 K/uL (ref 0.7–4.0)
MCH: 32.4 pg (ref 26.0–34.0)
MCHC: 34.6 g/dL (ref 30.0–36.0)
MCV: 93.7 fL (ref 80.0–100.0)
Monocytes Absolute: 0.7 K/uL (ref 0.1–1.0)
Monocytes Relative: 11 %
Neutro Abs: 4.4 K/uL (ref 1.7–7.7)
Neutrophils Relative %: 66 %
Platelets: 246 K/uL (ref 150–400)
RBC: 4.57 MIL/uL (ref 4.22–5.81)
RDW: 12.4 % (ref 11.5–15.5)
WBC: 6.6 K/uL (ref 4.0–10.5)
nRBC: 0 % (ref 0.0–0.2)

## 2024-03-10 LAB — LITHIUM LEVEL: Lithium Lvl: 0.55 mmol/L — ABNORMAL LOW (ref 0.60–1.20)

## 2024-03-10 LAB — ETHANOL: Alcohol, Ethyl (B): <15 mg/dL (ref <15)

## 2024-03-10 MED ORDER — OLANZAPINE 10 MG PO TABS
20.0000 mg | ORAL_TABLET | Freq: Every day | ORAL | Status: DC
  Administered 2024-03-11: 20 mg via ORAL
  Filled 2024-03-10 (×2): qty 2

## 2024-03-10 MED ORDER — APIXABAN 5 MG PO TABS
5.0000 mg | ORAL_TABLET | Freq: Two times a day (BID) | ORAL | Status: DC
  Administered 2024-03-10 – 2024-03-12 (×4): 5 mg via ORAL
  Filled 2024-03-10 (×4): qty 1

## 2024-03-10 MED ORDER — CLONAZEPAM 0.25 MG PO TBDP
0.2500 mg | ORAL_TABLET | Freq: Two times a day (BID) | ORAL | Status: DC | PRN
  Administered 2024-03-11: 0.25 mg via ORAL
  Filled 2024-03-10: qty 1

## 2024-03-10 MED ORDER — LISINOPRIL 5 MG PO TABS
2.5000 mg | ORAL_TABLET | Freq: Every day | ORAL | Status: DC
  Administered 2024-03-11 – 2024-03-12 (×2): 2.5 mg via ORAL
  Filled 2024-03-10 (×2): qty 1

## 2024-03-10 MED ORDER — METFORMIN HCL 500 MG PO TABS
1000.0000 mg | ORAL_TABLET | Freq: Two times a day (BID) | ORAL | Status: DC
  Administered 2024-03-11 – 2024-03-12 (×3): 1000 mg via ORAL
  Filled 2024-03-10 (×3): qty 2

## 2024-03-10 MED ORDER — SIMVASTATIN 40 MG PO TABS
40.0000 mg | ORAL_TABLET | Freq: Every evening | ORAL | Status: DC
  Filled 2024-03-10 (×2): qty 1

## 2024-03-10 NOTE — ED Provider Notes (Signed)
 [facility] Provider Note    Event Date/Time   First MD Initiated Contact with Patient 03/10/24 2041     (approximate)   History   Psychiatric Evaluation  EM caveat patient not able to give distinct history  HPI  Lance [name] is a 48 y.o. male history of schizophrenia, also history of lupus anticoagulant, active anticoagulation, diabetes and thromboembolism  Patient reports that he left therapy because he hears voices.  They are scary to him and feel like they are coming from demons and hell  He does not want to hurt himself.  He feels like he needs his medications evaluated and adjusted.       Physical Exam   Triage Vital Signs: ED Triage Vitals  Encounter Vitals Group     BP 03/10/24 1943 119/85     Girls Systolic BP Percentile --      Girls Diastolic BP Percentile --      Boys Systolic BP Percentile --      Boys Diastolic BP Percentile --      Pulse Rate 03/10/24 1943 97     Resp 03/10/24 1943 19     Temp 03/10/24 1943 98.5 F (36.9 C)     Temp Source 03/10/24 1943 Oral     SpO2 03/10/24 1943 98 %     Weight --      Height --      Head Circumference --      Peak Flow --      Pain Score 03/10/24 1949 0     Pain Loc --      Pain Education --      Exclude from Growth Chart --     Most recent vital signs: Vitals:   03/10/24 1943  BP: 119/85  Pulse: 97  Resp: 19  Temp: 98.5 F (36.9 C)  SpO2: 98%     General: Awake, no distress.  Pleasant.  Resting on the bed without distress eating, requesting whole milk CV:  Good peripheral perfusion.  Resp:  Normal effort.  Clear bilateral Abd:  No distention.  Other:  Pleasant in no acute distress.  Showing no agitation or aggression.  Does not appear to be obviously having any external response to stimuli.  He is not suicidal does not want to harm anyone else.  He does report he needs treatment otherwise he is hearing voices  He reports this has happened to him before and he  usually needs medication adjustment   ED Results / Procedures / Treatments   Labs (all labs ordered are listed, but only abnormal results are displayed) Labs Reviewed  COMPREHENSIVE METABOLIC PANEL WITH GFR - Abnormal; Notable for the following components:      Result Value   CO2 19 (*)    Glucose, Bld 180 (*)    BUN 30 (*)    Creatinine, Ser 1.37 (*)    All other components within normal limits  LITHIUM  LEVEL - Abnormal; Notable for the following components:   Lithium  Lvl 0.55 (*)    All other components within normal limits  ETHANOL  URINE DRUG SCREEN, QUALITATIVE ([facility] ONLY)  CBC WITH DIFFERENTIAL/PLATELET     EKG     RADIOLOGY     PROCEDURES:  Critical Care performed: No  Procedures   MEDICATIONS ORDERED IN ED: Medications  lisinopril  (ZESTRIL ) tablet 2.5 mg (has no administration in time range)  simvastatin  (ZOCOR ) tablet 40 mg (has no administration in time range)  metFORMIN  (GLUCOPHAGE )  tablet 1,000 mg (has no administration in time range)  apixaban  (ELIQUIS ) tablet 5 mg (5 mg Oral Given 03/10/24 2124)  clonazePAM  (KLONOPIN ) disintegrating tablet 0.25 mg (has no administration in time range)  OLANZapine  (ZYPREXA ) tablet 20 mg (has no administration in time range)     IMPRESSION / MDM / ASSESSMENT AND PLAN / ED COURSE  I reviewed the triage vital signs and the nursing notes.                              Differential diagnosis includes, but is not limited to, exacerbation of underlying psychiatric disease, medication noncompliance substance use, etc.  He has been residing at group home who has been facilitating and administering his medications under observation.  We will continue him on his medication including anticoagulation.  He does not report any self-injurious behavior or thoughts but is requesting treatment.  He is currently under voluntary status awaiting psychiatry consult with the caveat that his guardian has advised that he needs to stay for  psychiatric consultation which I think is quite reasonable  Labs interpreted by me as negative for acute anomalies.  Mild chronic renal disease  Patient's presentation is most consistent with acute complicated illness / injury requiring diagnostic workup.   Established history of previous presentations.  Does not appear to have evidence of acute underlying medical causation.  Most suspicious of underlying psychiatric disease   Clinical Course as of 03/11/24 0119  Fri Mar 10, 2024  2042 Lance [name] Legal Guardian Emergency Contact [phone]   [MQ]  2043 Discussed with Lance [name], guardian since 2011.  [MQ]  2046 Per Lance he was incarcerated, has had sporadic housing, but also intermittent homeless after incarceration. He has been resideing at a group home. Having hallucinations, paranoia known prior history of psychiatry. Is following with psych NP and a family carehome. Waked away from day program today and was found on streets in [place] concerned he was feeling paranoid, demons, et.c  [MQ]    Clinical Course User Index [MQ] [name], MD    The patient has been placed in psychiatric observation due to the need to provide a safe environment for the patient while obtaining psychiatric consultation and evaluation, as well as ongoing medical and medication management to treat the patient's condition.  The patient has not been placed under full IVC at this time havery has a guardian, and guardian presently wishing for him to need to stay for psychiatric evaluation].   FINAL CLINICAL IMPRESSION(S) / ED DIAGNOSES   Final diagnoses:  Auditory hallucinations     Rx / DC Orders   ED Discharge Orders     None        Note:  This document was prepared using Dragon voice recognition software and may include unintentional dictation errors.   [name], MD 03/11/24 [phone]

## 2024-03-10 NOTE — ED Notes (Signed)
 Pt ambulated to the bathroom, provided pt urine cup and ask for a urine specimen

## 2024-03-10 NOTE — ED Notes (Signed)
 Pt changed into hospital scrubs  T-shirt gray color, gray shoed, [employer] blue duffle bag, white socks, pack of cigarettes, light demin jeans, plaid underwear.1 black bracelet , one black and Sethi bracelet, one bracelet, black belt, black watch, jacket black, red and white

## 2024-03-10 NOTE — ED Notes (Signed)
 Pt provided a dinner tray and water.

## 2024-03-10 NOTE — ED Notes (Signed)
 Pt ask doctor for milk, provided

## 2024-03-10 NOTE — ED Triage Notes (Signed)
 Pt presents to ER from [facility], I feel like I am in a nightmare, seeing demons, seeing a bunch dreams, I am stuck in a dream Pt denies any HI and SI, reports visual hallucinations and hears piecing ringing that gets loud

## 2024-03-10 NOTE — ED Notes (Signed)
 Pt transported to [facility] with ED tech and security escort. Pt reporting to ED after wandering away from group home. Pt reported seeing demons and feeling like he is in a nightmare. Pt ambulatory and well appearing. Pt calm and cooperative. Pt ABCs intact. RR even and unlabored. Pt in NAD. Bed in lowest locked position.   Past Medical History:  Diagnosis Date   Depression    Diabetes mellitus without complication (HCC)    Hyperlipidemia    Hypertension    Lupus anticoagulant disorder 06/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   PE (pulmonary thromboembolism) (HCC)    Schizo affective schizophrenia (HCC)    Supratherapeutic INR 11/19/2016

## 2024-03-11 DIAGNOSIS — F209 Schizophrenia, unspecified: Secondary | ICD-10-CM | POA: Diagnosis not present

## 2024-03-11 LAB — CBG MONITORING, ED
Glucose-Capillary: 149 mg/dL — ABNORMAL HIGH (ref 70–99)
Glucose-Capillary: 124 mg/dL — ABNORMAL HIGH (ref 70–99)
Glucose-Capillary: 119 mg/dL — ABNORMAL HIGH (ref 70–99)

## 2024-03-11 MED ORDER — INSULIN ASPART 100 UNIT/ML IJ SOLN: 0.0000 [IU] | Freq: Every day | INTRAMUSCULAR | Status: DC

## 2024-03-11 MED ORDER — INSULIN ASPART 100 UNIT/ML IJ SOLN
0.0000 [IU] | Freq: Three times a day (TID) | INTRAMUSCULAR | Status: DC
  Administered 2024-03-11 – 2024-03-12 (×2): 3 [IU] via SUBCUTANEOUS
  Filled 2024-03-11 (×2): qty 1

## 2024-03-11 NOTE — Progress Notes (Signed)
 Informed nurse that TTS would not be present this shift. Requested nurse place IRIS consult. According to RN reception is not good in present location therefore, consult would be placed when patient was moved to the main side.

## 2024-03-11 NOTE — ED Notes (Signed)
Snacks given to pt.

## 2024-03-11 NOTE — ED Notes (Signed)
Pt using the phone at this time.  

## 2024-03-11 NOTE — ED Notes (Signed)
 Sliced apple and peanut butter provided with water provided for snack
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
## 2024-03-11 NOTE — Inpatient Diabetes Management (Signed)
 Inpatient Diabetes Program Recommendations  AACE/ADA: New Consensus Statement on Inpatient Glycemic Control   Target Ranges:  Prepandial:   less than 140 mg/dL      Peak postprandial:   less than 180 mg/dL (1-2 hours)      Critically ill patients:  140 - 180 mg/dL    Latest Reference Range & Units 03/10/24 19:45  Glucose 70 - 99 mg/dL 819 (H)   Review of Glycemic Control  Diabetes history: DM2 Outpatient Diabetes medications: Metformin  1000 mg BID Current orders for Inpatient glycemic control: Metformin  1000 mg BID  Inpatient Diabetes Program Recommendations:    Insulin : Please consider ordering CBGs AC&HS and Novolog  0-9 units TID with meals and Novolog  0-5 units at bedtime.  NOTE: Noted consult for Diabetes Coordinator. Diabetes Coordinator is not on campus over the weekend but available by pager from 8am to 5pm for questions or concerns. Patient holding in ED for psych evaluation.   Thanks, Earnie Gainer, RN, MSN, CDCES Diabetes Coordinator Inpatient Diabetes Program [phone] (Team Pager from 8am to 5pm)

## 2024-03-11 NOTE — ED Notes (Signed)
 VOL/ Pending psych consult/assessment

## 2024-03-11 NOTE — ED Provider Notes (Signed)
 Emergency Medicine Observation Re-evaluation Note  Shenouda Genova is a 48 y.o. male, seen on rounds today.  Pt initially presented to the ED for complaints of Psychiatric Evaluation Currently, the patient is resting.  Physical Exam  BP 119/85   Pulse 97   Temp 98.5 F (36.9 C) (Oral)   Resp 19   SpO2 98%   General: No distress   ED Course / MDM  EKG:   I have reviewed the labs performed to date as well as medications administered while in observation.  Recent changes in the last 24 hours include moved to [facility]. Awaiting psych eval.  Plan  Current plan is for psych eval for hallucinations. Remains voluntary    Claudene Rover, MD 03/11/24 [phone]

## 2024-03-11 NOTE — ED Notes (Addendum)
 pt was heard saying if they fail me I will fail the world while he paces in day room. Pt appears slightly agitated.

## 2024-03-11 NOTE — BH Assessment (Signed)
 Destination  Service Provider Request Status Services Address Phone Fax Patient Preferred  [facility]  Pending - Request Sent -- [address] [place] [address] [phone] --  [facility] Patient Placement  Pending - Request Sent -- [facility], [place] [phone] --  [facility]  Pending - Request Sent -- [address] [place] [address] [phone] --  [facility]  Pending - Request Sent -- [address] [place], [place] [address] [phone] --  [facility]  Pending - Request Sent -- [address] [place] [address] [phone] --  [facility]  Pending - Request Sent -- [address], [place] [address] [phone] --  [facility]  Pending - Request Sent -- [address] [place] [address] [phone] --  [facility]  Pending - Request Sent -- [address] [place] [address] [phone] --  [facility]  Pending - Request Sent -- [place] [phone] --  [facility]  Pending - Request Sent -- [address] [place] [address] [phone] --  [facility]  Pending - Request Sent -- [address] [place] [address] [phone] --  [facility]  Pending - Request Sent -- [address], [place] [address] [phone] --  [facility]  Pending - Request Sent -- [address] [place] [address] [phone] --  [facility]  Pending - Request Sent -- [address] [place] [address] [phone] --

## 2024-03-11 NOTE — ED Notes (Signed)
Calvin at the bedside for pt evaluation  

## 2024-03-11 NOTE — ED Notes (Signed)
Shower supplies provided and shower room unlocked for pt.

## 2024-03-11 NOTE — Consult Note (Signed)
 [facility] Psychiatric Consult Initial  Patient Name: .Lance [name]  MRN: [id number]  DOB: [birth year]  Consult Order details:  Orders (From admission, onward)     Start     Ordered   03/10/24 2057  IP CONSULT TO PSYCHIATRY       Ordering Provider: [name], MD  Provider:  (Not yet assigned)  Question Answer Comment  Reason for consult: Other (see comments)   Comments: paranoia, wandered from care home today      03/10/24 2058   03/10/24 2057  CONSULT TO CALL ACT TEAM       Ordering Provider: [name], MD  Provider:  (Not yet assigned)  Question:  Reason for Consult?  Answer:  paranoid   03/10/24 2058             Mode of Visit: In person    Psychiatry Consult Evaluation  Service Date: March 11, 2024 LOS:  LOS: 0 days  Chief Complaint Psychosis  Primary Psychiatric Diagnoses  Acute psychosis   Assessment  Lance [name] is a 48 y.o. male admitted: Presented to the [facility] 03/10/2024  7:56 PM for endorsing delusions and visual hallucinations.   Per ED provider note:  Lance [name] is a 47 y.o. male history of schizophrenia, also history of lupus anticoagulant, active anticoagulation, diabetes and thromboembolism. Patient reports that he left therapy because he hears voices. They are scary to him and feel like they are coming from demons and hell. He does not want to hurt himself.  He feels like he needs his medications evaluated and adjusted.  Diagnoses:  Active Hospital problems: Active Problems:  Acute psychosis   Plan   ## Psychiatric Medication Recommendations:  -Continue prescribed medications as directed.  ## Medical Decision Making Capacity: Patient has a guardian and has thus been adjudicated incompetent; please involve patients guardian in medical decision making  ## Further Work-up:  -- Pertinent labwork reviewed earlier this admission includes: CMP, CBC, Lithium  level, CoAg, Lipid, Microbiology, and UDS profiles.   ##  Disposition:-- We recommend inpatient psychiatric hospitalization when medically cleared. Patient is under voluntary admission status at this time; please IVC if attempts to leave hospital.  ## Behavioral / Environmental: -Patient would benefit from more frequent contact with medical team to delineate plan of care and allow for clarification questions, which will help alleviate anxiety regarding treatment. If possible, try to check back in with the pt in the afternoon.    ## Safety and Observation Level:  - Based on my clinical evaluation, I estimate the patient to be at low risk of self harm in the current setting. - At this time, we recommend  routine. This decision is based on my review of the chart including patient's history and current presentation, interview of the patient, mental status examination, and consideration of suicide risk including evaluating suicidal ideation, plan, intent, suicidal or self-harm behaviors, risk factors, and protective factors. This judgment is based on our ability to directly address suicide risk, implement suicide prevention strategies, and develop a safety plan while the patient is in the clinical setting. Please contact our team if there is a concern that risk level has changed.  CSSR Risk Category:C-SSRS RISK CATEGORY: No Risk  Suicide Risk Assessment: Patient has following modifiable risk factors for suicide: triggering events, mental illness (Schizophrenia, bipolar disorder), which we are addressing by use of psychotropic medication and utilizing therapeutic conversation with patient about events leading up to ED presentation. . Patient has following non-modifiable or  demographic risk factors for suicide: male gender and psychiatric hospitalization Patient has the following protective factors against suicide: Access to outpatient mental health care and Supportive friends  Thank you for this consult request. Recommendations have been communicated to the  primary team.  We will sign off at this time.   Lance JINNY Mountain, NP       History of Present Illness  Relevant Aspects of Hospital ED Course:   Patient is a 48 year old Caucasian male who presented to the ED with acute psychotic symptoms stating, I feel like I am in a nightmare; seeing demons, I am stuck in a dream. Upon evaluation this morning, patient remains acutely psychotic, observed overtly agitated, hostile, and angry. Patient endorses experiencing command auditory hallucinations of voices telling him to hurt his peers at the group and then either hurting himself or eloping. He is unsure which he will do. Patient denies suicidal ideations or homicidal ideations within the hospital, but he reports that he continues to periodically see demons. Patient was placed on Zyprexa  20 mg at bedtime. He reports not sleeping for several nights and having an erratic appetite. Per chart review he has been taking Clozapine , absolute neutrophil count was 4400 and his Lithium  levels were subtherapeutic. However, his BUN and creatine were elevated, which he most likely needs hydration.   Psych ROS:  Depression: endorses moderate symptoms Anxiety:  endorses moderate symptoms Mania (lifetime and current): multiple historical episodes, current symptoms present Psychosis: (lifetime and current): multiple historical episodes, current symptoms present  Collateral information:  Contacted Lance [name], Behavioral Counselor with [facility] at [phone] on 03/11/2024, who is a long-time friend and worked with patient for quite some time. Mr. [name] reports that patient is displaying symptoms of psychosis and mania, which patient contacted him and informed him on a several occasions within the past few weeks and endorsed that he is going to kill his peers within the group home and then kill himself. Mr. [name] is immensely concerned regarding patient's thought process and reports that patient is in of  stabilization.    Psychiatric and Social History  Psychiatric History:  Information collected from patient and chart review.  Prev Dx/Sx: Schizophrenia, Bipolar Disorder Current Psych Provider: Unknown Home Meds (current): Clozapine , Olanzapine , Lithium , Trazodone , Clonazepam  Previous Med Trials: Unknown Therapy: Unknown  Prior Psych Hospitalization: Multiple Prior Self Harm: Unknown Prior Violence: Endorsed recent episode and in the past  Family Psych History: Unknown Family Hx suicide: Unknown  Social History:  Educational Hx: Unknown Occupational Hx: Mentally disabled Legal Hx: Currently on probation/parole Living Situation: Group home Spiritual Hx: Unknown Access to weapons/lethal means: Denies  Substance History Alcohol: Denies Type of alcohol N/A Last Drink N/A Number of drinks per day N/A  History of alcohol withdrawal seizures Unknown History of DT's Unknown Tobacco: Unknown Illicit drugs: Unknown Prescription drug abuse: Unknown Rehab hx: Unknown  Exam Findings  Physical Exam: Deferred to EDP- note reviewed   Vital Signs:  Temp:  [97.7 F (36.5 C)-98.5 F (36.9 C)] 98.1 F (36.7 C) (09/27 1447) Pulse Rate:  [66-97] 75 (09/27 1447) Resp:  [17-19] 17 (09/27 1447) BP: (115-119)/(74-85) 115/74 (09/27 1447) SpO2:  [95 %-98 %] 98 % (09/27 1447) Blood pressure 115/74, pulse 75, temperature 98.1 F (36.7 C), temperature source Oral, resp. rate 17, SpO2 98%. There is no height or weight on file to calculate BMI.  Physical Exam  Mental Status Exam: General Appearance: Disheveled  Orientation:  Full (Time, Place, and Person)  Memory:  Immediate;   Fair Recent;   Fair Remote;   Fair  Concentration:  Concentration: Fair and Attention Span: Fair  Recall:  Fair  Attention  Poor  Eye Contact:  Fair  Speech:  Pressured  Language:  Good  Volume:  Increased  Mood: Irritable  Affect:  Constricted and Labile  Thought Process:  Disorganized  Thought  Content:  Tangential  Suicidal Thoughts:  No  Homicidal Thoughts:  Yes.  without intent/plan  Judgement:  Impaired  Insight:  Shallow  Psychomotor Activity:  Increased  Akathisia:  No  Fund of Knowledge:  Fair      Assets:  Biomedical engineer Social Support  Cognition:  Impaired,  Moderate  ADL's:  Intact  AIMS (if indicated):        Other History   These have been pulled in through the EMR, reviewed, and updated if appropriate.  Family History:  The patient's family history includes CAD in his mother and sister; Healthy in his father; Obesity in his sister; Rheum arthritis in his mother.  Medical History: Past Medical History:  Diagnosis Date   Depression    Diabetes mellitus without complication (HCC)    Hyperlipidemia    Hypertension    Lupus anticoagulant disorder 06/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   PE (pulmonary thromboembolism) (HCC)    Schizo affective schizophrenia (HCC)    Supratherapeutic INR 11/19/2016    Surgical History: No past surgical history on file.   Medications:   Current Facility-Administered Medications:    apixaban  (ELIQUIS ) tablet 5 mg, 5 mg, Oral, BID, Quale, Mark, MD, 5 mg at 03/11/24 1002   clonazePAM  (KLONOPIN ) disintegrating tablet 0.25 mg, 0.25 mg, Oral, BID PRN, [name], MD   insulin  aspart (novoLOG ) injection 0-20 Units, 0-20 Units, Subcutaneous, TID WC, Claudene Rover, MD, 3 Units at 03/11/24 1638   insulin  aspart (novoLOG ) injection 0-5 Units, 0-5 Units, Subcutaneous, QHS, Claudene Rover, MD   lisinopril  (ZESTRIL ) tablet 2.5 mg, 2.5 mg, Oral, Daily, Quale, Mark, MD, 2.5 mg at 03/11/24 1002   metFORMIN  (GLUCOPHAGE ) tablet 1,000 mg, 1,000 mg, Oral, BID WC, Quale, Mark, MD, 1,000 mg at 03/11/24 1638   OLANZapine  (ZYPREXA ) tablet 20 mg, 20 mg, Oral, QHS, Quale, Anes, MD   simvastatin  (ZOCOR ) tablet 40 mg, 40 mg, Oral, QPM, Quale, Mark, MD  Current Outpatient Medications:    albuterol  (VENTOLIN  HFA)  108 (90 Base) MCG/ACT inhaler, Inhale 2 puffs into the lungs every 6 (six) hours as needed for wheezing or shortness of breath., Disp: , Rfl:    clonazePAM  (KLONOPIN ) 0.25 MG disintegrating tablet, Take 1 tablet (0.25 mg total) by mouth 2 (two) times daily as needed for seizure., Disp: 60 tablet, Rfl: 0   cloZAPine  (CLOZARIL ) 50 MG tablet, Take 5 tablets (250 mg total) by mouth at bedtime., Disp: 150 tablet, Rfl: 0   ELIQUIS  2.5 MG TABS tablet, Take 2.5 mg by mouth 2 (two) times daily., Disp: , Rfl:    fluticasone -salmeterol (ADVAIR) 250-50 MCG/ACT AEPB, Inhale 1 puff into the lungs 2 (two) times daily., Disp: 60 each, Rfl: 1   lisinopril  (ZESTRIL ) 2.5 MG tablet, Take 1 tablet (2.5 mg total) by mouth daily., Disp: 30 tablet, Rfl: 1   lithium  carbonate (ESKALITH ) 450 MG ER tablet, Take 1 tablet (450 mg total) by mouth at bedtime., Disp: 30 tablet, Rfl: 1   LORazepam  (ATIVAN ) 1 MG tablet, Take 1 mg by mouth every 6 (six) hours as needed (anxiety)., Disp: , Rfl:  melatonin 5 MG TABS, Take 5 mg by mouth at bedtime., Disp: , Rfl:    metFORMIN  (GLUCOPHAGE ) 1000 MG tablet, Take 1 tablet (1,000 mg total) by mouth 2 (two) times daily with a meal., Disp: 60 tablet, Rfl: 1   OLANZapine  (ZYPREXA ) 20 MG tablet, Take 1 tablet (20 mg total) by mouth at bedtime., Disp: 30 tablet, Rfl: 1   propranolol  (INDERAL ) 20 MG tablet, Take 1 tablet (20 mg total) by mouth at bedtime., Disp: 30 tablet, Rfl: 1   sertraline  (ZOLOFT ) 100 MG tablet, Take 1 tablet (100 mg total) by mouth daily., Disp: 30 tablet, Rfl: 1   simvastatin  (ZOCOR ) 40 MG tablet, Take 1 tablet (40 mg total) by mouth daily at 6 PM., Disp: 30 tablet, Rfl: 1   traZODone  (DESYREL ) 100 MG tablet, Take 1 tablet (100 mg total) by mouth at bedtime., Disp: 30 tablet, Rfl: 1   NOVOFINE AUTOCOVER PEN NEEDLE 30G X 8 MM MISC, USE WITH LEVEMIR  TWICE DAILY. (Patient not taking: Reported on 09/11/2023), Disp: 100 each, Rfl: 11  Allergies: Allergies  Allergen Reactions    Penicillins Other (See Comments)    Reaction: lockjaw as teen Tolerated Augmentin /Unasyn  06/09/22      Lance JINNY Mountain, NP

## 2024-03-11 NOTE — ED Notes (Signed)
 Lunch tray provided to pt.

## 2024-03-11 NOTE — ED Notes (Signed)
 Pt given meal tray.

## 2024-03-11 NOTE — BH Assessment (Signed)
 Patient has been accepted to [facility] Accepting physician is Dr. Oneil Roa.  Call report to [phone].  Representative was [employer].    ER Staff is aware of it:  Jerel, ER Michaela Morrison, Patient's Nurse       Address: [address]  [place], [place] [address]   The bed is available today (03/11/2024) but will hold it for tomorrow (03/12/2024) when transportation is available.

## 2024-03-11 NOTE — ED Notes (Signed)
 Breakfast tray provided with cranberry juice

## 2024-03-11 NOTE — BH Assessment (Signed)
 Comprehensive Clinical Assessment (CCA) Screening, Triage and Referral Note  03/11/2024 Lance [name] [id number]  Chief Complaint:  Chief Complaint  Patient presents with   Psychiatric Evaluation   Visit Diagnosis: Schizophrenia  Lance [name] is a 48 year old male who presents to the ER voicing SI, unsure if he is the antichrist, and hearing voices. He was last seen with Lance [name] for similar mental health concerns was March 2025 and inpatient May 2024. Patient's guardian has concerns about his mental health. In the past it resulted in him getting into legal problems because he was too irrational.   Patient Reported Information How did you hear about us ? Self  What Is the Reason for Your Visit/Call Today? Patient came to the ER voicing SI because he wanted to leave out of the group home.  How Long Has This Been Causing You Problems? > than 6 months  What Do You Feel Would Help You the Most Today? Treatment for Depression or other mood problem   Have You Recently Had Any Thoughts About Hurting Yourself? No  Are You Planning to Commit Suicide/Harm Yourself At This time? No   Have you Recently Had Thoughts About Hurting Someone [name]? No  Are You Planning to Harm Someone at This Time? No  Explanation: Pt reported having thoughts of wanting to physically attack his roommate at the group home.   Have You Used Any Alcohol or Drugs in the Past 24 Hours? No  How Long Ago Did You Use Drugs or Alcohol? No data recorded What Did You Use and How Much? No data recorded  Do You Currently Have a Therapist/Psychiatrist? Yes  Name of Therapist/Psychiatrist: Group Home   Have You Been Recently Discharged From Any Office Practice or Programs? No  Explanation of Discharge From Practice/Program: [facility]    CCA Screening Triage Referral Assessment Type of Contact: Face-to-Face  Telemedicine Service Delivery:   Is this Initial or Reassessment?    Date Telepsych consult ordered in [facility]:    Time Telepsych consult ordered in [facility]:    Location of Assessment: Lance [name] ED  Provider Location: Lance [name] ED    Collateral Involvement: Lance,[name] (Legal Guardian)  [phone]   Does Patient Have a Court Appointed Legal Guardian? No data recorded Name and Contact of Legal Guardian: No data recorded If Minor and Not Living with Parent(s), Who has Custody? No data recorded Is CPS involved or ever been involved? Never  Is APS involved or ever been involved? Never   Patient Determined To Be At Risk for Harm To Self or Others Based on Review of Patient Reported Information or Presenting Complaint? No  Method: No Plan  Availability of Means: No access or NA  Intent: Vague intent or NA  Notification Required: No need or identified person  Additional Information for Danger to Others Potential: Previous attempts  Additional Comments for Danger to Others Potential: Pt has a hx of physical aggression  Are There Guns or Other Weapons in Your Home? No  Types of Guns/Weapons: n/a  Are These Weapons Safely Secured?                            No  Who Could Verify You Are Able To Have These Secured: n/a  Do You Have any Outstanding Charges, Pending Court Dates, Parole/Probation? n/a  Contacted To Inform of Risk of Harm To Self or Others: Other: Comment   Does Patient Present under Involuntary  Commitment? No   [place] of Residence: [place]   Patient Currently Receiving the Following Services: Group Home; Medication Management   Determination of Need: Emergent (2 hours)   Options For Referral: ED Visit; Group Home   Disposition Recommendation per psychiatric provider: Discharge to current outpatient provider via his group home.  Lance DOROTHA Barge MS, [name], [facility], [facility] Therapeutic Triage Specialist 03/11/2024 12:30 PM

## 2024-03-12 DIAGNOSIS — F209 Schizophrenia, unspecified: Secondary | ICD-10-CM | POA: Diagnosis not present

## 2024-03-12 LAB — CBG MONITORING, ED
Glucose-Capillary: 121 mg/dL — ABNORMAL HIGH (ref 70–99)
Glucose-Capillary: 62 mg/dL — ABNORMAL LOW (ref 70–99)
Glucose-Capillary: 98 mg/dL (ref 70–99)

## 2024-03-12 MED ORDER — LITHIUM CARBONATE ER 450 MG PO TBCR: 450.0000 mg | EXTENDED_RELEASE_TABLET | Freq: Every day | ORAL | Status: DC

## 2024-03-12 MED ORDER — TRAZODONE HCL 100 MG PO TABS: 100.0000 mg | ORAL_TABLET | Freq: Every day | ORAL | Status: DC

## 2024-03-12 MED ORDER — LITHIUM CARBONATE ER 300 MG PO TBCR: 300.0000 mg | EXTENDED_RELEASE_TABLET | Freq: Two times a day (BID) | ORAL | Status: DC

## 2024-03-12 MED ORDER — SERTRALINE HCL 100 MG PO TABS: 100.0000 mg | ORAL_TABLET | Freq: Every day | ORAL | Status: DC

## 2024-03-12 MED ORDER — CLOZAPINE 100 MG PO TABS: 250.0000 mg | ORAL_TABLET | Freq: Every day | ORAL | Status: DC

## 2024-03-12 NOTE — ED Notes (Signed)
 Vol/Pt has been accepted to [facility]/Accepted Doc Dr Oneil Chaeltenham/Pt will be transported on 03/12/24

## 2024-03-12 NOTE — ED Notes (Signed)
 Pt given 8oz of juice for CBG 62. Pt in NAD.

## 2024-03-12 NOTE — ED Notes (Signed)
 Verbal consent to transfer obtained with Powell RN and Leontine RN at this time

## 2024-03-12 NOTE — ED Provider Notes (Signed)
 Emergency Medicine Observation Re-evaluation Note  Lance [name] is a 48 y.o. male, seen on rounds today.  Pt initially presented to the ED for complaints of Psychiatric Evaluation  Currently, the patient is is no acute distress. Denies any concerns at this time.  Physical Exam  Blood pressure 116/62, pulse 63, temperature 97.7 F (36.5 C), temperature source Oral, resp. rate 17, SpO2 99%.  Physical Exam: General: No apparent distress Pulm: Normal WOB Neuro: Moving all extremities Psych: Resting comfortably     ED Course / MDM    I have reviewed the labs performed to date as well as medications administered while in observation.  Recent changes in the last 24 hours include: No acute events overnight.  Plan   Current plan: Patient accepted to [facility], plan for transportation today Patient is not under full IVC at this time.    Suzanne Kirsch, MD 03/12/24 [phone]

## 2024-03-12 NOTE — ED Notes (Signed)
 Pt taking shower. Pt was given hygiene items and the following, 1 clean top, 1 clean bottom, with 1 pair of disposable underwear.  Pt changed out into clean clothing.  Staff disposed of all shower supplies.

## 2024-03-12 NOTE — ED Notes (Signed)
Snack was given to pt. 

## 2024-03-12 NOTE — ED Notes (Addendum)
 Breakfast and juice was provided to pt.

## 2024-04-03 ENCOUNTER — Encounter: Payer: Self-pay | Admitting: Podiatry

## 2024-04-03 ENCOUNTER — Ambulatory Visit: Admitting: Podiatry

## 2024-04-03 DIAGNOSIS — Q828 Other specified congenital malformations of skin: Secondary | ICD-10-CM | POA: Diagnosis not present

## 2024-04-03 DIAGNOSIS — B351 Tinea unguium: Secondary | ICD-10-CM | POA: Diagnosis not present

## 2024-04-03 DIAGNOSIS — E119 Type 2 diabetes mellitus without complications: Secondary | ICD-10-CM

## 2024-04-03 DIAGNOSIS — M79676 Pain in unspecified toe(s): Secondary | ICD-10-CM

## 2024-04-09 NOTE — Progress Notes (Signed)
  Subjective:  Patient ID: Lance [name], male    DOB: 07/20/1975,  MRN: [id number]  Lance [name] presents to clinic today for preventative diabetic foot care and painful porokeratotic lesion(s) right foot and painful mycotic toenails that limit ambulation. Painful toenails interfere with ambulation. Aggravating factors include wearing enclosed shoe gear. Pain is relieved with periodic professional debridement. Painful porokeratotic lesions are aggravated when weightbearing with and without shoegear. Pain is relieved with periodic professional debridement.  Chief Complaint  Patient presents with   Toe Pain    Dr. Jadali is his PCP. Last visit was. He states he is board[place] line diabetic. Unsure of  A1c   New problem(s): None.   PCP is Weyman Bright, MD.  Allergies  Allergen Reactions   Penicillins Other (See Comments)    Reaction: lockjaw as teen Tolerated Augmentin /Unasyn  06/09/22      Review of Systems: Negative except as noted in the HPI.  Objective: No changes noted in today's physical examination. There were no vitals filed for this visit. Lance [name] is a pleasant 48 y.o. male WD, WN in NAD. AAO x 3.   Vascular Examination: Vascular status intact b/l with palpable pedal pulses. CFT immediate b/l. No edema. No pain with calf compression b/l. Skin temperature gradient WNL b/l.   Neurological Examination: Sensation grossly intact b/l with 10 gram monofilament. Vibratory sensation intact b/l.   Dermatological Examination: Pedal skin with normal turgor, texture and tone b/l. Toenails 1-5 b/l thick, discolored, elongated with subungual debris and pain on dorsal palpation. Porokeratotic lesion(s) submet head 4 right foot. No erythema, no edema, no drainage, no fluctuance.  Musculoskeletal Examination: Muscle strength 5/5 to b/l LE. Plantarflexed metatarsal(s) 4th metatarsal head right foot.  Radiographs: None  Assessment/Plan: 1. Pain due to onychomycosis of  toenail   2. Porokeratosis   3. Diabetes mellitus without complication [facility])   Consent given for treatment. Patient examined. All patient's and/or POA's questions/concerns addressed on today's visit. Toenails 1-5 b/l debrided in length and girth without incident. Porokeratotic lesion(s) submet head 4 right foot pared and enucleated with sharp debridement without incident. Continue foot and shoe inspections daily. Monitor blood glucose per PCP/Endocrinologist's recommendations.Continue soft, supportive shoe gear daily. Report any pedal injuries to medical professional. Call office if there are any questions/concerns.  Return in about 3 months (around 07/04/2024).  Lance [name], DPM      [facility] LOCATION: [address] [place] [address]                   Office [phone]   [facility] LOCATION: [address] [place], [place] [address] Office [phone]

## 2024-04-30 ENCOUNTER — Emergency Department (HOSPITAL_COMMUNITY)
Admission: EM | Admit: 2024-04-30 | Discharge: 2024-05-01 | Disposition: A | Discharge status: Other Acute Inpatient Hospital | Source: Home / Self Care | Attending: Emergency Medicine | Admitting: Emergency Medicine

## 2024-04-30 ENCOUNTER — Other Ambulatory Visit: Payer: Self-pay

## 2024-04-30 DIAGNOSIS — F29 Unspecified psychosis not due to a substance or known physiological condition: Secondary | ICD-10-CM | POA: Insufficient documentation

## 2024-04-30 DIAGNOSIS — F419 Anxiety disorder, unspecified: Secondary | ICD-10-CM | POA: Diagnosis not present

## 2024-04-30 DIAGNOSIS — F209 Schizophrenia, unspecified: Secondary | ICD-10-CM | POA: Diagnosis not present

## 2024-04-30 DIAGNOSIS — E119 Type 2 diabetes mellitus without complications: Secondary | ICD-10-CM | POA: Insufficient documentation

## 2024-04-30 DIAGNOSIS — I10 Essential (primary) hypertension: Secondary | ICD-10-CM | POA: Insufficient documentation

## 2024-04-30 DIAGNOSIS — Z79899 Other long term (current) drug therapy: Secondary | ICD-10-CM | POA: Insufficient documentation

## 2024-04-30 DIAGNOSIS — F411 Generalized anxiety disorder: Secondary | ICD-10-CM | POA: Diagnosis present

## 2024-04-30 DIAGNOSIS — F3170 Bipolar disorder, currently in remission, most recent episode unspecified: Secondary | ICD-10-CM | POA: Insufficient documentation

## 2024-04-30 DIAGNOSIS — R45851 Suicidal ideations: Secondary | ICD-10-CM | POA: Insufficient documentation

## 2024-04-30 DIAGNOSIS — F319 Bipolar disorder, unspecified: Secondary | ICD-10-CM | POA: Diagnosis present

## 2024-04-30 LAB — CBC
HCT: 42.4 % (ref 39.0–52.0)
Hemoglobin: 14.7 g/dL (ref 13.0–17.0)
MCH: 32 pg (ref 26.0–34.0)
MCHC: 34.7 g/dL (ref 30.0–36.0)
MCV: 92.2 fL (ref 80.0–100.0)
Platelets: 250 K/uL (ref 150–400)
RBC: 4.6 MIL/uL (ref 4.22–5.81)
RDW: 12.1 % (ref 11.5–15.5)
WBC: 7.3 K/uL (ref 4.0–10.5)
nRBC: 0 % (ref 0.0–0.2)

## 2024-04-30 LAB — COMPREHENSIVE METABOLIC PANEL WITH GFR
ALT: 30 U/L (ref 0–44)
AST: 21 U/L (ref 15–41)
Albumin: 4.8 g/dL (ref 3.5–5.0)
Alkaline Phosphatase: 71 U/L (ref 38–126)
Anion gap: 18 — ABNORMAL HIGH (ref 5–15)
BUN: 24 mg/dL — ABNORMAL HIGH (ref 6–20)
CO2: 19 mmol/L — ABNORMAL LOW (ref 22–32)
Calcium: 10.1 mg/dL (ref 8.9–10.3)
Chloride: 104 mmol/L (ref 98–111)
Creatinine, Ser: 1.62 mg/dL — ABNORMAL HIGH (ref 0.61–1.24)
GFR, Estimated: 52 mL/min — ABNORMAL LOW (ref >60)
Glucose, Bld: 139 mg/dL — ABNORMAL HIGH (ref 70–99)
Potassium: 4.4 mmol/L (ref 3.5–5.1)
Sodium: 141 mmol/L (ref 135–145)
Total Bilirubin: 0.2 mg/dL (ref 0.0–1.2)
Total Protein: 7.1 g/dL (ref 6.5–8.1)

## 2024-04-30 LAB — ETHANOL: Alcohol, Ethyl (B): <15 mg/dL (ref <15)

## 2024-04-30 LAB — RESP PANEL BY RT-PCR (RSV, FLU A&B, COVID)  RVPGX2
Influenza A by PCR: NEGATIVE
Influenza B by PCR: NEGATIVE
Resp Syncytial Virus by PCR: NEGATIVE
SARS Coronavirus 2 by RT PCR: NEGATIVE

## 2024-04-30 LAB — GROUP A STREP BY PCR: Group A Strep by PCR: NOT DETECTED

## 2024-04-30 MED ORDER — CLOZAPINE 100 MG PO TABS
250.0000 mg | ORAL_TABLET | Freq: Every day | ORAL | Status: DC
  Administered 2024-04-30: 250 mg via ORAL
  Filled 2024-04-30: qty 3

## 2024-04-30 MED ORDER — ALBUTEROL SULFATE HFA 108 (90 BASE) MCG/ACT IN AERS: 2.0000 | INHALATION_SPRAY | Freq: Four times a day (QID) | RESPIRATORY_TRACT | Status: DC | PRN

## 2024-04-30 MED ORDER — OLANZAPINE 10 MG PO TABS: 20.0000 mg | ORAL_TABLET | Freq: Every day | ORAL | Status: DC

## 2024-04-30 MED ORDER — IBUPROFEN 600 MG PO TABS: 600.0000 mg | ORAL_TABLET | Freq: Three times a day (TID) | ORAL | Status: DC | PRN

## 2024-04-30 MED ORDER — OLANZAPINE 10 MG PO TABS
10.0000 mg | ORAL_TABLET | Freq: Every day | ORAL | Status: DC
  Administered 2024-04-30: 10 mg via ORAL
  Filled 2024-04-30: qty 1

## 2024-04-30 MED ORDER — PROPRANOLOL HCL 10 MG PO TABS
20.0000 mg | ORAL_TABLET | Freq: Every day | ORAL | Status: DC
  Administered 2024-04-30: 20 mg via ORAL
  Filled 2024-04-30: qty 2

## 2024-04-30 MED ORDER — SIMVASTATIN 20 MG PO TABS
40.0000 mg | ORAL_TABLET | Freq: Every day | ORAL | Status: DC
  Filled 2024-04-30: qty 2
  Filled 2024-04-30: qty 1

## 2024-04-30 MED ORDER — METFORMIN HCL 500 MG PO TABS
1000.0000 mg | ORAL_TABLET | Freq: Two times a day (BID) | ORAL | Status: DC
Start: 2024-05-01 — End: 2024-05-01
  Administered 2024-05-01: 1000 mg via ORAL
  Filled 2024-04-30: qty 2

## 2024-04-30 MED ORDER — SERTRALINE HCL 50 MG PO TABS
50.0000 mg | ORAL_TABLET | Freq: Every day | ORAL | Status: DC
  Administered 2024-05-01: 50 mg via ORAL
  Filled 2024-04-30: qty 1

## 2024-04-30 MED ORDER — APIXABAN 2.5 MG PO TABS
2.5000 mg | ORAL_TABLET | Freq: Two times a day (BID) | ORAL | Status: DC
  Administered 2024-04-30 – 2024-05-01 (×2): 2.5 mg via ORAL
  Filled 2024-04-30 (×2): qty 1

## 2024-04-30 MED ORDER — LORAZEPAM 1 MG PO TABS
1.0000 mg | ORAL_TABLET | Freq: Four times a day (QID) | ORAL | Status: DC
  Administered 2024-04-30 – 2024-05-01 (×2): 1 mg via ORAL
  Filled 2024-04-30 (×2): qty 1

## 2024-04-30 MED ORDER — TRAZODONE HCL 100 MG PO TABS
100.0000 mg | ORAL_TABLET | Freq: Every day | ORAL | Status: DC
  Administered 2024-04-30: 100 mg via ORAL
  Filled 2024-04-30: qty 1

## 2024-04-30 MED ORDER — CLONAZEPAM 0.25 MG PO TBDP: 0.2500 mg | ORAL_TABLET | Freq: Two times a day (BID) | ORAL | Status: DC | PRN

## 2024-04-30 MED ORDER — LITHIUM CARBONATE ER 450 MG PO TBCR
450.0000 mg | EXTENDED_RELEASE_TABLET | Freq: Every day | ORAL | Status: DC
Start: 2024-04-30 — End: 2024-05-01
  Administered 2024-04-30: 450 mg via ORAL
  Filled 2024-04-30: qty 1

## 2024-04-30 MED ORDER — FLUTICASONE FUROATE-VILANTEROL 200-25 MCG/ACT IN AEPB
1.0000 | INHALATION_SPRAY | Freq: Every day | RESPIRATORY_TRACT | Status: DC
  Administered 2024-05-01: 1 via RESPIRATORY_TRACT
  Filled 2024-04-30: qty 28

## 2024-04-30 MED ORDER — ONDANSETRON HCL 4 MG PO TABS: 4.0000 mg | ORAL_TABLET | Freq: Three times a day (TID) | ORAL | Status: DC | PRN

## 2024-04-30 MED ORDER — LISINOPRIL 5 MG PO TABS
2.5000 mg | ORAL_TABLET | Freq: Every day | ORAL | Status: DC
  Administered 2024-05-01: 2.5 mg via ORAL
  Filled 2024-04-30: qty 1

## 2024-04-30 MED ORDER — HYDROXYZINE HCL 25 MG PO TABS
50.0000 mg | ORAL_TABLET | Freq: Once | ORAL | Status: AC
  Administered 2024-04-30: 50 mg via ORAL
  Filled 2024-04-30: qty 2

## 2024-04-30 MED ORDER — SIMVASTATIN 40 MG PO TABS
40.0000 mg | ORAL_TABLET | Freq: Every day | ORAL | Status: DC
  Administered 2024-04-30: 40 mg via ORAL
  Filled 2024-04-30: qty 4
  Filled 2024-04-30: qty 1

## 2024-04-30 MED ORDER — MELATONIN 5 MG PO TABS
5.0000 mg | ORAL_TABLET | Freq: Every day | ORAL | Status: DC
  Administered 2024-04-30: 5 mg via ORAL
  Filled 2024-04-30: qty 1

## 2024-04-30 MED ORDER — ALUM & MAG HYDROXIDE-SIMETH 200-200-20 MG/5ML PO SUSP: 30.0000 mL | Freq: Four times a day (QID) | ORAL | Status: DC | PRN

## 2024-04-30 MED ORDER — HYDROXYZINE HCL 25 MG PO TABS: 50.0000 mg | ORAL_TABLET | Freq: Three times a day (TID) | ORAL | Status: DC | PRN

## 2024-04-30 NOTE — ED Notes (Signed)
 Patient speaking to TTS and telepsych provider.

## 2024-04-30 NOTE — ED Notes (Signed)
 Collected the pt urine sample and he was also given snack. A banana, ice cream and milk

## 2024-04-30 NOTE — ED Provider Notes (Signed)
 [facility] Provider Note    Event Date/Time   First MD Initiated Contact with Patient 04/30/24 1710     (approximate)   History   Chief Complaint: Suicidal   HPI  Deklen Popelka is a 48 y.o. male with a history of schizoaffective disorder, diabetes, hypertension, pulmonary embolism who is brought to the ED due to anxiety, suicidal thoughts.  Denies any intent to harm himself currently, but notes that he has become incest with death recently, worried that forces of evil are going to take over the world and then the world would be a negative place to live.  Denies paranoia, no auditory hallucinations, no HI.  Reports compliance with his medications.     Physical Exam   Triage Vital Signs: ED Triage Vitals  Encounter Vitals Group     BP 04/30/24 1656 132/79     Girls Systolic BP Percentile --      Girls Diastolic BP Percentile --      Boys Systolic BP Percentile --      Boys Diastolic BP Percentile --      Pulse Rate 04/30/24 1656 (!) 114     Resp 04/30/24 1656 20     Temp 04/30/24 1656 97.9 F (36.6 C)     Temp Source 04/30/24 1656 Oral     SpO2 04/30/24 1656 99 %     Weight 04/30/24 1700 185 lb (83.9 kg)     Height 04/30/24 1700 5' 5 (1.651 m)     Head Circumference --      Peak Flow --      Pain Score 04/30/24 1659 5     Pain Loc --      Pain Education --      Exclude from Growth Chart --     Most recent vital signs: Vitals:   04/30/24 1656  BP: 132/79  Pulse: (!) 114  Resp: 20  Temp: 97.9 F (36.6 C)  SpO2: 99%    General: Awake, no distress. CV:  Good peripheral perfusion.  Resp:  Normal effort.  Clear lungs. Abd:  No distention.  Other:  No wounds   ED Results / Procedures / Treatments   Labs (all labs ordered are listed, but only abnormal results are displayed) Labs Reviewed  GROUP A STREP BY PCR  RESP PANEL BY RT-PCR (RSV, FLU A&B, COVID)  RVPGX2  CBC  COMPREHENSIVE METABOLIC PANEL WITH GFR  ETHANOL  URINE  DRUG SCREEN     EKG    RADIOLOGY    PROCEDURES:  Procedures   MEDICATIONS ORDERED IN ED: Medications - No data to display   IMPRESSION / MDM / ASSESSMENT AND PLAN / ED COURSE  I reviewed the triage vital signs and the nursing notes.  Patient's presentation is most consistent with acute presentation with potential threat to life or bodily function.  Patient presents with increased psychiatric symptoms.  Medically stable.  Did report some body aches, will obtain flu and COVID swab.  Medically stable to proceed with psychiatric evaluation.  The patient has been placed in psychiatric observation due to the need to provide a safe environment for the patient while obtaining psychiatric consultation and evaluation, as well as ongoing medical and medication management to treat the patient's condition.  The patient has not been placed under full IVC at this time.      FINAL CLINICAL IMPRESSION(S) / ED DIAGNOSES   Final diagnoses:  Anxiety  Suicidal thoughts  Bipolar affective disorder, remission  status unspecified (HCC)     Rx / DC Orders   ED Discharge Orders     None        Note:  This document was prepared using Dragon voice recognition software and may include unintentional dictation errors.   Viviann Pastor, MD 04/30/24 [phone]
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
## 2024-04-30 NOTE — ED Notes (Signed)
VOL  pending  consult 

## 2024-04-30 NOTE — ED Notes (Signed)

## 2024-04-30 NOTE — BH Assessment (Signed)
 Patient was deferred to [facility] for a telepsych assessment. The assigned care coordinator will provide updates regarding the scheduling of the assessment. [facility]IS coordinator can be reached at [phone] for further information on the timing of the telepsych evaluation.

## 2024-04-30 NOTE — ED Notes (Signed)
 This NT gave the pt a glass of milk

## 2024-04-30 NOTE — ED Triage Notes (Signed)
 Pt to ED voluntarily brought by [employer] officer for SI. Pt lives at [facility] group home, [phone]. Pt left the group home this AM and did not return and called police to bring him to ED. Pt states he has been having extreme anxiety and thoughts of death. States anxiety is interfering with sleep. States if Suzan is gonna control the world, what good is that going to do me?.  States also he has sore throat and also whole body feels sore.

## 2024-05-01 ENCOUNTER — Other Ambulatory Visit: Payer: Self-pay

## 2024-05-01 ENCOUNTER — Inpatient Hospital Stay
Admission: AD | Admit: 2024-05-01 | Discharge: 2024-05-07 | DRG: 880 | Disposition: A | Discharge status: Home / Self Care | Attending: Child & Adolescent Psychiatry | Admitting: Child & Adolescent Psychiatry

## 2024-05-01 DIAGNOSIS — I10 Essential (primary) hypertension: Secondary | ICD-10-CM | POA: Diagnosis present

## 2024-05-01 DIAGNOSIS — Z5941 Food insecurity: Secondary | ICD-10-CM

## 2024-05-01 DIAGNOSIS — Z653 Problems related to other legal circumstances: Secondary | ICD-10-CM

## 2024-05-01 DIAGNOSIS — E785 Hyperlipidemia, unspecified: Secondary | ICD-10-CM | POA: Diagnosis present

## 2024-05-01 DIAGNOSIS — D6862 Lupus anticoagulant syndrome: Secondary | ICD-10-CM | POA: Diagnosis present

## 2024-05-01 DIAGNOSIS — Z7951 Long term (current) use of inhaled steroids: Secondary | ICD-10-CM

## 2024-05-01 DIAGNOSIS — F17290 Nicotine dependence, other tobacco product, uncomplicated: Secondary | ICD-10-CM | POA: Diagnosis present

## 2024-05-01 DIAGNOSIS — Z7901 Long term (current) use of anticoagulants: Secondary | ICD-10-CM

## 2024-05-01 DIAGNOSIS — Z5982 Transportation insecurity: Secondary | ICD-10-CM

## 2024-05-01 DIAGNOSIS — R45851 Suicidal ideations: Secondary | ICD-10-CM | POA: Diagnosis present

## 2024-05-01 DIAGNOSIS — F411 Generalized anxiety disorder: Principal | ICD-10-CM | POA: Diagnosis present

## 2024-05-01 DIAGNOSIS — F17210 Nicotine dependence, cigarettes, uncomplicated: Secondary | ICD-10-CM | POA: Diagnosis present

## 2024-05-01 DIAGNOSIS — F515 Nightmare disorder: Secondary | ICD-10-CM | POA: Diagnosis present

## 2024-05-01 DIAGNOSIS — G479 Sleep disorder, unspecified: Secondary | ICD-10-CM | POA: Diagnosis present

## 2024-05-01 DIAGNOSIS — E119 Type 2 diabetes mellitus without complications: Secondary | ICD-10-CM | POA: Diagnosis present

## 2024-05-01 DIAGNOSIS — Z86711 Personal history of pulmonary embolism: Secondary | ICD-10-CM | POA: Diagnosis not present

## 2024-05-01 DIAGNOSIS — Z9151 Personal history of suicidal behavior: Secondary | ICD-10-CM

## 2024-05-01 DIAGNOSIS — Z880 Allergy status to penicillin: Secondary | ICD-10-CM

## 2024-05-01 DIAGNOSIS — Z604 Social exclusion and rejection: Secondary | ICD-10-CM | POA: Diagnosis present

## 2024-05-01 DIAGNOSIS — F419 Anxiety disorder, unspecified: Secondary | ICD-10-CM

## 2024-05-01 DIAGNOSIS — Z9152 Personal history of nonsuicidal self-harm: Secondary | ICD-10-CM | POA: Diagnosis not present

## 2024-05-01 DIAGNOSIS — F250 Schizoaffective disorder, bipolar type: Secondary | ICD-10-CM | POA: Diagnosis present

## 2024-05-01 DIAGNOSIS — Z8261 Family history of arthritis: Secondary | ICD-10-CM | POA: Diagnosis not present

## 2024-05-01 DIAGNOSIS — Z79899 Other long term (current) drug therapy: Secondary | ICD-10-CM

## 2024-05-01 DIAGNOSIS — Z7984 Long term (current) use of oral hypoglycemic drugs: Secondary | ICD-10-CM

## 2024-05-01 DIAGNOSIS — Z5948 Other specified lack of adequate food: Secondary | ICD-10-CM

## 2024-05-01 DIAGNOSIS — Z8249 Family history of ischemic heart disease and other diseases of the circulatory system: Secondary | ICD-10-CM | POA: Diagnosis not present

## 2024-05-01 DIAGNOSIS — F209 Schizophrenia, unspecified: Secondary | ICD-10-CM | POA: Diagnosis not present

## 2024-05-01 LAB — CBC WITH DIFFERENTIAL/PLATELET
Abs Immature Granulocytes: 0.01 K/uL (ref 0.00–0.07)
Basophils Absolute: 0 K/uL (ref 0.0–0.1)
Basophils Relative: 0 %
Eosinophils Absolute: 0 K/uL (ref 0.0–0.5)
Eosinophils Relative: 0 %
HCT: 41.2 % (ref 39.0–52.0)
Hemoglobin: 14.2 g/dL (ref 13.0–17.0)
Immature Granulocytes: 0 %
Lymphocytes Relative: 30 %
Lymphs Abs: 1.3 K/uL (ref 0.7–4.0)
MCH: 31.4 pg (ref 26.0–34.0)
MCHC: 34.5 g/dL (ref 30.0–36.0)
MCV: 91.2 fL (ref 80.0–100.0)
Monocytes Absolute: 0.5 K/uL (ref 0.1–1.0)
Monocytes Relative: 12 %
Neutro Abs: 2.5 K/uL (ref 1.7–7.7)
Neutrophils Relative %: 58 %
Platelets: 221 K/uL (ref 150–400)
RBC: 4.52 MIL/uL (ref 4.22–5.81)
RDW: 12 % (ref 11.5–15.5)
WBC: 4.3 K/uL (ref 4.0–10.5)
nRBC: 0 % (ref 0.0–0.2)

## 2024-05-01 LAB — URINE DRUG SCREEN
Amphetamines: NEGATIVE
Barbiturates: NEGATIVE
Benzodiazepines: POSITIVE — AB
Cocaine: NEGATIVE
Fentanyl: NEGATIVE
Methadone Scn, Ur: NEGATIVE
Opiates: NEGATIVE
Tetrahydrocannabinol: NEGATIVE

## 2024-05-01 LAB — LITHIUM LEVEL: Lithium Lvl: 0.77 mmol/L (ref 0.60–1.20)

## 2024-05-01 MED ORDER — PROPRANOLOL HCL 20 MG PO TABS
20.0000 mg | ORAL_TABLET | Freq: Every day | ORAL | Status: DC
  Administered 2024-05-01 – 2024-05-06 (×6): 20 mg via ORAL
  Filled 2024-05-01 (×6): qty 1

## 2024-05-01 MED ORDER — LORAZEPAM 1 MG PO TABS
1.0000 mg | ORAL_TABLET | Freq: Four times a day (QID) | ORAL | Status: DC | PRN
  Administered 2024-05-02 – 2024-05-03 (×2): 1 mg via ORAL
  Filled 2024-05-01 (×2): qty 1

## 2024-05-01 MED ORDER — METFORMIN HCL 500 MG PO TABS
1000.0000 mg | ORAL_TABLET | Freq: Two times a day (BID) | ORAL | Status: DC
  Administered 2024-05-01 – 2024-05-07 (×12): 1000 mg via ORAL
  Filled 2024-05-01 (×12): qty 2

## 2024-05-01 MED ORDER — LORAZEPAM 2 MG/ML IJ SOLN: 2.0000 mg | Freq: Three times a day (TID) | INTRAMUSCULAR | Status: DC | PRN

## 2024-05-01 MED ORDER — DIPHENHYDRAMINE HCL 50 MG/ML IJ SOLN: 50.0000 mg | Freq: Three times a day (TID) | INTRAMUSCULAR | Status: DC | PRN

## 2024-05-01 MED ORDER — NICOTINE 14 MG/24HR TD PT24
14.0000 mg | MEDICATED_PATCH | Freq: Every day | TRANSDERMAL | Status: DC
  Administered 2024-05-02 – 2024-05-06 (×3): 14 mg via TRANSDERMAL
  Filled 2024-05-01 (×6): qty 1

## 2024-05-01 MED ORDER — FLUTICASONE FUROATE-VILANTEROL 200-25 MCG/ACT IN AEPB
1.0000 | INHALATION_SPRAY | Freq: Every day | RESPIRATORY_TRACT | Status: DC
  Administered 2024-05-02 – 2024-05-07 (×4): 1 via RESPIRATORY_TRACT
  Filled 2024-05-01: qty 28

## 2024-05-01 MED ORDER — HALOPERIDOL LACTATE 5 MG/ML IJ SOLN: 10.0000 mg | Freq: Three times a day (TID) | INTRAMUSCULAR | Status: DC | PRN

## 2024-05-01 MED ORDER — RISPERIDONE 1 MG PO TABS
1.0000 mg | ORAL_TABLET | Freq: Two times a day (BID) | ORAL | Status: DC
  Administered 2024-05-01 – 2024-05-07 (×12): 1 mg via ORAL
  Filled 2024-05-01 (×12): qty 1

## 2024-05-01 MED ORDER — HALOPERIDOL LACTATE 5 MG/ML IJ SOLN: 5.0000 mg | Freq: Three times a day (TID) | INTRAMUSCULAR | Status: DC | PRN

## 2024-05-01 MED ORDER — ACETAMINOPHEN 325 MG PO TABS: 650.0000 mg | ORAL_TABLET | Freq: Four times a day (QID) | ORAL | Status: DC | PRN

## 2024-05-01 MED ORDER — LISINOPRIL 5 MG PO TABS
2.5000 mg | ORAL_TABLET | Freq: Every day | ORAL | Status: DC
  Administered 2024-05-02 – 2024-05-07 (×6): 2.5 mg via ORAL
  Filled 2024-05-01 (×6): qty 1

## 2024-05-01 MED ORDER — TRAZODONE HCL 100 MG PO TABS
100.0000 mg | ORAL_TABLET | Freq: Every day | ORAL | Status: DC
  Administered 2024-05-01 – 2024-05-06 (×6): 100 mg via ORAL
  Filled 2024-05-01 (×6): qty 1

## 2024-05-01 MED ORDER — ALBUTEROL SULFATE HFA 108 (90 BASE) MCG/ACT IN AERS: 2.0000 | INHALATION_SPRAY | Freq: Four times a day (QID) | RESPIRATORY_TRACT | Status: DC | PRN

## 2024-05-01 MED ORDER — HALOPERIDOL 5 MG PO TABS: 5.0000 mg | ORAL_TABLET | Freq: Three times a day (TID) | ORAL | Status: DC | PRN

## 2024-05-01 MED ORDER — CLOZAPINE 25 MG PO TABS
250.0000 mg | ORAL_TABLET | Freq: Every day | ORAL | Status: DC
  Administered 2024-05-01 – 2024-05-06 (×6): 250 mg via ORAL
  Filled 2024-05-01 (×6): qty 2

## 2024-05-01 MED ORDER — MELATONIN 5 MG PO TABS
5.0000 mg | ORAL_TABLET | Freq: Every day | ORAL | Status: DC
  Administered 2024-05-01 – 2024-05-06 (×6): 5 mg via ORAL
  Filled 2024-05-01 (×6): qty 1

## 2024-05-01 MED ORDER — SIMVASTATIN 20 MG PO TABS
40.0000 mg | ORAL_TABLET | Freq: Every day | ORAL | Status: DC
  Administered 2024-05-01 – 2024-05-06 (×6): 40 mg via ORAL
  Filled 2024-05-01 (×7): qty 2

## 2024-05-01 MED ORDER — DIPHENHYDRAMINE HCL 25 MG PO CAPS: 50.0000 mg | ORAL_CAPSULE | Freq: Three times a day (TID) | ORAL | Status: DC | PRN

## 2024-05-01 MED ORDER — ALUM & MAG HYDROXIDE-SIMETH 200-200-20 MG/5ML PO SUSP
30.0000 mL | Freq: Four times a day (QID) | ORAL | Status: DC | PRN
  Administered 2024-05-06: 30 mL via ORAL
  Filled 2024-05-01: qty 30

## 2024-05-01 MED ORDER — APIXABAN 2.5 MG PO TABS
2.5000 mg | ORAL_TABLET | Freq: Two times a day (BID) | ORAL | Status: DC
  Administered 2024-05-01 – 2024-05-07 (×12): 2.5 mg via ORAL
  Filled 2024-05-01 (×16): qty 1

## 2024-05-01 MED ORDER — LITHIUM CARBONATE ER 450 MG PO TBCR
450.0000 mg | EXTENDED_RELEASE_TABLET | Freq: Every day | ORAL | Status: DC
  Administered 2024-05-01 – 2024-05-06 (×6): 450 mg via ORAL
  Filled 2024-05-01 (×6): qty 1

## 2024-05-01 NOTE — ED Provider Notes (Signed)
 ED observation note   Vitals:   04/30/24 1838 04/30/24 2130  BP: 111/61 132/78  Pulse: 95 94  Resp: 14 18  Temp: 98.3 F (36.8 C) 97.6 F (36.4 C)  SpO2: 95% 97%     No acute events during my shift, patient awaiting placement at this time.   Fernand Rossie HERO, MD 05/01/24 [phone]

## 2024-05-01 NOTE — ED Notes (Signed)
 Pt came to the door and asked for a comb, the comb was given to him and brought it back when finished

## 2024-05-01 NOTE — Plan of Care (Signed)
  Problem: Education: Goal: Emotional status will improve Outcome: Progressing Goal: Mental status will improve Outcome: Progressing   Problem: Health Behavior/Discharge Planning: Goal: Compliance with treatment plan for underlying cause of condition will improve Outcome: Progressing   Problem: Physical Regulation: Goal: Ability to maintain clinical measurements within normal limits will improve Outcome: Progressing   Problem: Safety: Goal: Periods of time without injury will increase Outcome: Progressing

## 2024-05-01 NOTE — ED Notes (Signed)
 Lunch tray provided to pt.

## 2024-05-01 NOTE — Progress Notes (Addendum)
 Patient has been accepted to [facility] 05/01/24 pending vol consent. Patient assigned to room 313. Accepting physician is Dr. Millie Manners. Call report to [phone]. Representative was [facility] AC Linsey.   ER Staff is aware of it: Jon, ER Secretary Dr. Rossie, ER MD Peyton, RN Patient's Nurse

## 2024-05-01 NOTE — Tx Team (Signed)
 Initial Treatment Plan 05/01/2024 3:26 PM [name] FMW:[id number]    PATIENT STRESSORS: Other: Relationships     PATIENT STRENGTHS: Other: Patient reports being a good listening and being friendly.    PATIENT IDENTIFIED PROBLEMS: Anxiety  Terrible sleep                   DISCHARGE CRITERIA:  Ability to meet basic life and health needs Adequate post-discharge living arrangements Improved stabilization in mood, thinking, and/or behavior Verbal commitment to aftercare and medication compliance  PRELIMINARY DISCHARGE PLAN: Return to previous living arrangement  PATIENT/FAMILY INVOLVEMENT: This treatment plan has been presented to and reviewed with the patient, Lance [name]. The patient has been given the opportunity to ask questions and make suggestions.  Lance MARLA Rosella, RN 05/01/2024, 3:26 PM

## 2024-05-01 NOTE — ED Notes (Signed)
 NP went in to speak to pt
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
## 2024-05-01 NOTE — ED Notes (Signed)
Unlocked bathroom door to allow patient to use restroom. Pt exited and bathroom door locked behind patient by staff.  

## 2024-05-01 NOTE — ED Notes (Signed)
 Pt provided with breakfast tray.

## 2024-05-01 NOTE — Group Note (Signed)
 Date:  05/01/2024 Time:  8:58 PM  Group Topic/Focus:  Managing Feelings:   The focus of this group is to identify what feelings patients have difficulty handling and develop a plan to handle them in a healthier way upon discharge. Wrap-Up Group:   The focus of this group is to help patients review their daily goal of treatment and discuss progress on daily workbooks.    Participation Level:  Minimal  Participation Quality:  Appropriate  Affect:  Appropriate  Cognitive:  Alert and Appropriate  Insight: Appropriate and Good  Engagement in Group:  Limited  Modes of Intervention:  Discussion  Additional Comments:     Arlester CHRISTELLA Servant 05/01/2024, 8:58 PM

## 2024-05-01 NOTE — ED Notes (Signed)
 Lance [name] Legal Guardian Emergency Contact [phone] Legal guardian  Contacted in regards to consent for inpatient hospitalization. No answer, left message.

## 2024-05-01 NOTE — Progress Notes (Signed)
   05/01/24 1500  Psych Admission Type (Psych Patients Only)  Admission Status Voluntary  Psychosocial Assessment  Patient Complaints Anxiety;Insomnia (Patient reports he has had increased anxiety and has not been sleeping well.)  Eye Contact Brief  Facial Expression Flat  Affect Apathetic  Speech Logical/coherent;Slow;Tangential  Interaction Assertive  Motor Activity Slow  Appearance/Hygiene Disheveled;In scrubs  Behavior Characteristics Cooperative  Mood Anxious;Pleasant  Thought Process  Coherency Tangential  Content WDL  Delusions None reported or observed  Perception WDL  Hallucination None reported or observed (Patient reports he occasionally sees big heads but denies any current auditory or visual hallucinations)  Judgment Limited  Confusion Mild (Baseline for patient)  Danger to Self  Current suicidal ideation? Denies  Danger to Others  Danger to Others None reported or observed

## 2024-05-01 NOTE — Group Note (Signed)
 Recreation Therapy Group Note   Group Topic:Other  Group Date: 05/01/2024 Start Time: 1500 End Time: 1545 Facilitators: Celestia Jeoffrey FORBES ARTICE, CTRS Location: Craft Room  Activity Description/Intervention: Therapeutic Drumming. Patients with peers and staff were given the opportunity to engage in a leader facilitated [facility] Group Empowerment Drumming Circle with staff from the [employer], in partnership with [employer]. Teaching laboratory technician and trained [employer], Norleen Mon leading with LRT observing and documenting intervention and pt response. This evidenced-based practice targets 7 areas of health and wellbeing in the human experience including: stress-reduction, exercise, self-expression, camaraderie/support, nurturing, spirituality, and music-making (leisure).    Goal Area(s) Addresses:  Patient will engage in pro-social way in music group.  Patient will follow directions of drum leader on the first prompt. Patient will demonstrate no behavioral issues during group.  Patient will identify if a reduction in stress level occurs as a result of participation in therapeutic drum circle.  Affect/Mood: Appropriate   Participation Level: Minimal    Clinical Observations/Individualized Feedback: Lance [name] was present for less than half of group. Pt left early and did not return.   Plan: Continue to engage patient in RT group sessions 2-3x/week.   Jeoffrey FORBES Celestia, LRT, CTRS 05/01/2024 5:26 PM

## 2024-05-01 NOTE — Group Note (Signed)
 LCSW Group Therapy Note   Group Date: 05/01/2024 Start Time: 1300 End Time: 1400   Type of Therapy and Topic:  Group Therapy: Challenging Core Beliefs  Participation Level:  Did Not Attend  Description of Group:  Patients were educated about core beliefs and asked to identify one harmful core belief that they have. Patients were asked to explore from where those beliefs originate. Patients were asked to discuss how those beliefs make them feel and the resulting behaviors of those beliefs. They were then be asked if those beliefs are true and, if so, what evidence they have to support them. Lastly, group members were challenged to replace those negative core beliefs with helpful beliefs.   Therapeutic Goals:   1. Patient will identify harmful core beliefs and explore the origins of such beliefs. 2. Patient will identify feelings and behaviors that result from those core beliefs. 3. Patient will discuss whether such beliefs are true. 4.  Patient will replace harmful core beliefs with helpful ones.  Summary of Patient Progress:  Patient did not attend group.   Therapeutic Modalities: Cognitive Behavioral Therapy; Solution-Focused Therapy   Burkley Dech M Kamiryn Bezanson, LCSWA 05/01/2024  2:26 PM

## 2024-05-01 NOTE — Group Note (Signed)
 Date:  05/01/2024 Time:  7:09 PM  Group Topic/Focus:  Dimensions of Wellness:   The focus of this group is to introduce the topic of wellness and discuss the role each dimension of wellness plays in total health.    Participation Level:  Did Not Attend   [name] Geneva Barrero 05/01/2024, 7:09 PM

## 2024-05-01 NOTE — ED Notes (Signed)
 Voluntary admission to [facility] unit consent obtained verbally over phone with Powell, RN   [name] Legal Guardian Emergency Contact [phone] Legal guardian

## 2024-05-02 DIAGNOSIS — F411 Generalized anxiety disorder: Principal | ICD-10-CM

## 2024-05-02 LAB — LIPID PANEL
Cholesterol: 147 mg/dL (ref 0–200)
HDL: 32 mg/dL — ABNORMAL LOW (ref >40)
LDL Cholesterol: 68 mg/dL (ref 0–99)
Total CHOL/HDL Ratio: 4.6 ratio
Triglycerides: 234 mg/dL — ABNORMAL HIGH (ref <150)
VLDL: 47 mg/dL — ABNORMAL HIGH (ref 0–40)

## 2024-05-02 NOTE — Group Note (Signed)
 Recreation Therapy Group Note   Group Topic:Self-Esteem  Group Date: 05/02/2024 Start Time: 1000 End Time: 1050 Facilitators: Celestia Jeoffrey BRAVO, LRT, CTRS Location: Craft Room  Group Description: Positive Affirmation Worksheet. Patients and LRT discussed the importance of self-love/self-esteem and things that cause it to fluctuate, including our mental health. Patients completed a worksheet that helps them identify 24 different strengths and qualities about themselves. Pt encouraged to read aloud at least 3 off their sheet to the group. LRT and pts discussed how this can be applied to daily life post-discharge. After completing worksheet, patients played Positive Affirmation Bingo and won candy, approved by [employer], as prizes.   Goal Area(s) Addressed: Patient will identify positive qualities about themselves. Patient will learn new positive affirmations.  Patient will recite positive qualities and affirmations aloud to the group.  Patient will practice positive self-talk.  Patient will increase communication.   Affect/Mood: Flat   Participation Level: Minimal    Clinical Observations/Individualized Feedback: Lance [name] came late to group. Pt left early. Pt was present for 5 minutes.   Plan: Continue to engage patient in RT group sessions 2-3x/week.   Jeoffrey BRAVO Celestia, LRT, CTRS 05/02/2024 11:13 AM

## 2024-05-02 NOTE — Progress Notes (Signed)
   05/02/24 0300  Psych Admission Type (Psych Patients Only)  Admission Status Voluntary  Psychosocial Assessment  Patient Complaints None  Eye Contact Brief  Facial Expression Flat  Affect Appropriate to circumstance  Speech Logical/coherent  Interaction Assertive  Motor Activity Other (Comment) (WDL)  Appearance/Hygiene Disheveled  Behavior Characteristics Cooperative  Mood Anxious;Depressed;Pleasant  Aggressive Behavior  Effect No apparent injury  Thought Process  Coherency WDL  Content WDL  Delusions None reported or observed  Perception WDL  Hallucination None reported or observed  Judgment Limited  Confusion Mild  Danger to Self  Current suicidal ideation? Denies  Agreement Not to Harm Self Yes  Description of Agreement Verbal  Danger to Others  Danger to Others None reported or observed

## 2024-05-02 NOTE — Group Note (Signed)
 Recreation Therapy Group Note   Group Topic:General Recreation  Group Date: 05/02/2024 Start Time: 1510 End Time: 1600 Facilitators: Celestia Jeoffrey BRAVO, LRT, CTRS Location: Courtyard  Group Description: [employer]. LRT and patients played games of basketball, drew with chalk, and played corn hole while outside in the courtyard while getting fresh air and sunlight. Music was being played in the background. LRT and peers conversed about different games they have played before, what they do in their free time and anything else that is on their minds. LRT encouraged pts to drink water after being outside, sweating and getting their heart rate up.  Goal Area(s) Addressed: Patient will build on frustration tolerance skills. Patients will partake in a competitive play game with peers. Patients will gain knowledge of new leisure interest/hobby.     Affect/Mood: Appropriate   Participation Level: Active   Participation Quality: Independent   Behavior: Appropriate   Speech/Thought Process: Coherent   Insight: Fair   Judgement: Fair    Modes of Intervention: Activity   Patient Response to Interventions:  Receptive   Education Outcome:  Acknowledges education   Clinical Observations/Individualized Feedback: Lance [name] was active in their participation of session activities and group discussion. Pt interacted well with LRT and peers duration of session.    Plan: Continue to engage patient in RT group sessions 2-3x/week.   Jeoffrey BRAVO Celestia, LRT, CTRS 05/02/2024 5:09 PM
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
## 2024-05-02 NOTE — [facility] Suicide Risk Assessment (Signed)
 [facility] Admission Suicide Risk Assessment   Nursing information obtained from:  Patient Demographic factors:  Male, Caucasian, Unemployed Current Mental Status:  NA Loss Factors:  NA Historical Factors:  Prior suicide attempts Risk Reduction Factors:  Positive social support  Total Time spent with patient: 30 minutes Principal Problem: GAD (generalized anxiety disorder) Diagnosis:  Principal Problem:   GAD (generalized anxiety disorder)  Subjective Data: This is a 48 year old male who presented with passive suicidal ideation in the context of worsening anxiety and insomnia.  Past psychiatric history is significant for schizoaffective disorder.  Current psychiatric medications include trazodone , Clozaril , melatonin, Ativan .  Family history is significant for personality disorder in mother and suicide in uncle.  Patient is admitted to the adult inpatient unit with every 15-minute safety monitoring.  Multidisciplinary team approach is offered.  Medication management, group/milieu therapy is offered.  Continued Clinical Symptoms:  Alcohol Use Disorder Identification Test Final Score (AUDIT): 0 The Alcohol Use Disorders Identification Test, Guidelines for Use in Primary Care, Second Edition.  World Science Writer [facility]). Score between 0-7:  no or low risk or alcohol related problems. Score between 8-15:  moderate risk of alcohol related problems. Score between 16-19:  high risk of alcohol related problems. Score 20 or above:  warrants further diagnostic evaluation for alcohol dependence and treatment.   CLINICAL FACTORS:  Schizoaffective disorder, anxiety  Musculoskeletal: Strength & Muscle Tone: within normal limits Gait & Station: normal Patient leans: N/A  Psychiatric Specialty Exam:  Presentation  General Appearance:  Disheveled  Eye Contact: Poor  Speech: Garbled  Speech Volume: Decreased  Handedness:No data recorded  Mood and Affect  Mood: Depressed;  Anxious  Affect: Congruent   Thought Process  Thought Processes: Linear  Descriptions of Associations:Intact  Orientation:Full (Time, Place and Person)  Thought Content:WDL  History of Schizophrenia/Schizoaffective disorder:No data recorded Duration of Psychotic Symptoms:Greater than six months  Hallucinations:Hallucinations: Auditory  Ideas of Reference:No data recorded Suicidal Thoughts:Suicidal Thoughts: Yes, Passive  Homicidal Thoughts:Homicidal Thoughts: No   Sensorium  Memory: Immediate Fair; Recent Fair; Remote Poor  Judgment: Fair  Insight: Fair   Chartered Certified Accountant: Fair  Attention Span: Fair  Recall: [employer] of Knowledge: Fair  Language: Fair   Psychomotor Activity  Psychomotor Activity: Psychomotor Activity: Normal   Assets  Assets: Manufacturing Systems Engineer; Desire for Improvement   Sleep  Sleep: Sleep: Poor    Physical Exam: Physical Exam ROS Blood pressure 107/66, pulse 72, temperature 97.9 F (36.6 C), temperature source Oral, resp. rate 16, height 5' 10 (1.778 m), weight 90.7 kg, SpO2 99%. Body mass index is 28.7 kg/m.   COGNITIVE FEATURES THAT CONTRIBUTE TO RISK:  None    SUICIDE RISK:   Mild:  Suicidal ideation of limited frequency, intensity, duration, and specificity.  There are no identifiable plans, no associated intent, mild dysphoria and related symptoms, good self-control (both objective and subjective assessment), few other risk factors, and identifiable protective factors, including available and accessible social support.  PLAN OF CARE: Patient is admitted to the adult inpatient unit with every 15-minute safety monitoring.  Multidisciplinary team approach is offered.  Medication management, group/milieu therapy is offered.  I certify that inpatient services furnished can reasonably be expected to improve the patient's condition.   Katheen Aslin LITTIE Lukes, PA-C 05/02/2024, 11:35 AM

## 2024-05-02 NOTE — B[facility] Counselor (Signed)
 CSW attempted contact with pt guardian, [name] [phone]). Contact was unable to be established but HIPAA compliant voicemail left with contact information for follow through.   Nadara SAUNDERS. Chaim, MSW, LCSW, [name] 05/02/2024 3:22 PM

## 2024-05-02 NOTE — Plan of Care (Signed)
   Problem: Education: Goal: Mental status will improve Outcome: Not Progressing

## 2024-05-02 NOTE — Plan of Care (Signed)
   Problem: Education: Goal: Knowledge of Lance [name] General Education information/materials will improve Outcome: Progressing Goal: Emotional status will improve Outcome: Progressing Goal: Mental status will improve Outcome: Progressing Goal: Verbalization of understanding the information provided will improve Outcome: Progressing   Problem: Activity: Goal: Interest or engagement in activities will improve Outcome: Progressing Goal: Sleeping patterns will improve Outcome: Progressing   Problem: Coping: Goal: Ability to verbalize frustrations and anger appropriately will improve Outcome: Progressing Goal: Ability to demonstrate self-control will improve Outcome: Progressing

## 2024-05-02 NOTE — Group Note (Signed)
 LCSW Group Therapy Note   Group Date: 05/02/2024 Start Time: 1310 End Time: 1400   Type of Therapy and Topic:  Group Therapy: Boundaries  Participation Level:  Minimal  Description of Group: This group will address the use of boundaries in their personal lives. Patients will explore why boundaries are important, the difference between healthy and unhealthy boundaries, and negative and postive outcomes of different boundaries and will look at how boundaries can be crossed.  Patients will be encouraged to identify current boundaries in their own lives and identify what kind of boundary is being set. Facilitators will guide patients in utilizing problem-solving interventions to address and correct types boundaries being used and to address when no boundary is being used. Understanding and applying boundaries will be explored and addressed for obtaining and maintaining a balanced life. Patients will be encouraged to explore ways to assertively make their boundaries and needs known to significant others in their lives, using other group members and facilitator for role play, support, and feedback.  Therapeutic Goals:  1.  Patient will identify areas in their life where setting clear boundaries could be  used to improve their life.  2.  Patient will identify signs/triggers that a boundary is not being respected. 3.  Patient will identify two ways to set boundaries in order to achieve balance in  their lives: 4.  Patient will demonstrate ability to communicate their needs and set boundaries  through discussion and/or role plays  Summary of Patient Progress:   Patient was present and active throughout the session and proved open to feedback from CSW and peers. Patient demonstrated poor insight into the subject matter, was respectful of peers, and was present throughout the entire session.  While patient participated in the activity, he declined to participate in discussions.    Therapeutic  Modalities:   Cognitive Behavioral Therapy Solution-Focused Therapy  Sherryle JINNY Margo, LCSW 05/02/2024  1:49 PM

## 2024-05-02 NOTE — Progress Notes (Signed)
   05/02/24 1000  Psych Admission Type (Psych Patients Only)  Admission Status Voluntary  Psychosocial Assessment  Patient Complaints Depression;Anxiety (depression and anxiety 8/10)  Eye Contact Brief  Facial Expression Flat  Affect Apathetic  Speech Soft  Interaction Assertive  Motor Activity Slow  Appearance/Hygiene Disheveled  Behavior Characteristics Cooperative  Mood Pleasant  Thought Process  Coherency WDL  Content WDL  Delusions None reported or observed  Perception WDL  Hallucination None reported or observed (states at night I see my mom and sisters face)  Judgment Limited  Confusion Mild  Danger to Self  Current suicidal ideation? Passive  Agreement Not to Harm Self Yes (when asked about plan pt states I havent thought that far yet)  Description of Agreement verbal  Danger to Others  Danger to Others None reported or observed

## 2024-05-02 NOTE — Progress Notes (Signed)
   05/02/24 2100  Psych Admission Type (Psych Patients Only)  Admission Status Voluntary  Psychosocial Assessment  Patient Complaints Depression  Eye Contact Brief  Facial Expression Flat  Affect Anxious;Blunted  Speech Soft;Slow  Interaction Assertive  Motor Activity Slow  Appearance/Hygiene Disheveled  Behavior Characteristics Cooperative  Mood Pleasant  Thought Process  Coherency WDL  Content WDL  Delusions None reported or observed  Perception WDL  Hallucination None reported or observed  Judgment Limited  Confusion None  Danger to Self  Current suicidal ideation? Denies  Agreement Not to Harm Self Yes  Description of Agreement verbal  Danger to Others  Danger to Others None reported or observed

## 2024-05-02 NOTE — Group Note (Signed)
 Date:  05/02/2024 Time:  10:23 AM  Group Topic/Focus:  Self Esteem Action Plan:   The focus of this group is to help patients create a plan to continue to build self-esteem after discharge.    Participation Level:  Did Not Attend   Lance [name] 05/02/2024, 10:23 AM

## 2024-05-02 NOTE — Group Note (Signed)
 Date:  05/02/2024 Time:  8:35 PM  Group Topic/Focus:  Wrap-Up Group:   The focus of this group is to help patients review their daily goal of treatment and discuss progress on daily workbooks.    Participation Level:  Active  Participation Quality:  Appropriate, Attentive, and Sharing  Affect:  Appropriate  Cognitive:  Appropriate  Insight: Appropriate  Engagement in Group:  Engaged  Modes of Intervention:  Discussion  Additional Comments:     Kerri Katz 05/02/2024, 8:35 PM

## 2024-05-03 ENCOUNTER — Other Ambulatory Visit (HOSPITAL_COMMUNITY): Payer: Self-pay

## 2024-05-03 MED ORDER — LORAZEPAM 1 MG PO TABS
1.0000 mg | ORAL_TABLET | Freq: Two times a day (BID) | ORAL | Status: DC
  Administered 2024-05-03 – 2024-05-07 (×8): 1 mg via ORAL
  Filled 2024-05-03 (×8): qty 1

## 2024-05-03 NOTE — Plan of Care (Signed)

## 2024-05-03 NOTE — Group Note (Signed)
 Date:  05/03/2024 Time:  11:11 AM  Group Topic/Focus:  Pet Therapy: Rollo the Dog has made an appearance on the unit to help contribute to the patients' mental health!    Participation Level:  Did Not Attend   [name] Havish Petties 05/03/2024, 11:11 AM

## 2024-05-03 NOTE — Progress Notes (Signed)
 [facility] MD Progress Note  05/03/2024 4:46 PM Lance [name]  MRN:  [id number]   Subjective:  Chart reviewed, case discussed in multidisciplinary meeting, patient seen during rounds.   11/19: On interview today, patient is calm and cooperative, alert and oriented.  He rates depression as 10 out of 10 and when asked about anxiety he states he is feeling a lot of anxiety.  He reports taking Ativan  1 mg twice daily at home every day for approximately the last 2 weeks.  Will schedule Ativan  twice daily.  PDMP reviewed.  Patient is counseled on risks of long-term use of benzodiazepines.  Patient reports passive SI without intent or plan.  He denies HI/plan and denies hallucinations.  He reports fair sleep and appetite.  He is tolerating medication regimen well without adverse effects.  He is future oriented and reports understanding of the importance of medication compliance.  He does not voice any concerns or complaints at this time.  Past Psychiatric History: see h&P Family History:  Family History  Problem Relation Age of Onset   CAD Mother    Rheum arthritis Mother    Healthy Father    CAD Sister    Obesity Sister    Social History:  Social History   Substance and Sexual Activity  Alcohol Use Not Currently   Comment: occassionally     Social History   Substance and Sexual Activity  Drug Use Yes   Types: Marijuana   Comment: Patient reports occasionally    Social History   Socioeconomic History   Marital status: Single    Spouse name: Not on file   Number of children: Not on file   Years of education: Not on file   Highest education level: Not on file  Occupational History   Not on file  Tobacco Use   Smoking status: Every Day    Current packs/day: 1.00    Average packs/day: 1 pack/day for 23.0 years (23.0 ttl pk-yrs)    Types: Cigarettes   Smokeless tobacco: Never   Tobacco comments:    will provide material  Vaping Use   Vaping status: Every Day  Substance and  Sexual Activity   Alcohol use: Not Currently    Comment: occassionally   Drug use: Yes    Types: Marijuana    Comment: Patient reports occasionally   Sexual activity: Not Currently    Birth control/protection: None    Comment: occasional marijuana- none recently  Other Topics Concern   Not on file  Social History Narrative   From a group home in [place]   Social Drivers of Health   Financial Resource Strain: Low Risk  (03/20/2021)   Overall Financial Resource Strain (CARDIA)    Difficulty of Paying Living Expenses: Not very hard  Food Insecurity: No Food Insecurity (05/01/2024)   Hunger Vital Sign    Worried About Running Out of Food in the Last Year: Never true    Ran Out of Food in the Last Year: Never true  Transportation Needs: No Transportation Needs (05/01/2024)   PRAPARE - Administrator, Civil Service (Medical): No    Lack of Transportation (Non-Medical): No  Physical Activity: Insufficiently Active (03/20/2021)   Exercise Vital Sign    Days of Exercise per Week: 3 days    Minutes of Exercise per Session: 20 min  Stress: No Stress Concern Present (03/20/2021)   [employer] of Occupational Health - Occupational Stress Questionnaire    Feeling of Stress : Only  a little  Social Connections: Socially Isolated (03/20/2021)   Social Connection and Isolation Panel    Frequency of Communication with Friends and Family: More than three times a week    Frequency of Social Gatherings with Friends and Family: More than three times a week    Attends Religious Services: Never    Database Administrator or Organizations: No    Attends Engineer, Structural: Never    Marital Status: Never married   Past Medical History:  Past Medical History:  Diagnosis Date   Depression    Diabetes mellitus without complication (HCC)    Hyperlipidemia    Hypertension    Lupus anticoagulant disorder 06/14/2012   as per Dr.pandit's note in dec 2013.   PE (pulmonary  thromboembolism) (HCC)    Schizo affective schizophrenia (HCC)    Supratherapeutic INR 11/19/2016   History reviewed. No pertinent surgical history.  Current Medications: Current Facility-Administered Medications  Medication Dose Route Frequency Provider Last Rate Last Admin   acetaminophen  (TYLENOL ) tablet 650 mg  650 mg Oral Q6H PRN [name], [name], NP       albuterol  (VENTOLIN  HFA) 108 (90 Base) MCG/ACT inhaler 2 puff  2 puff Inhalation Q6H PRN [name], [name], NP       alum & mag hydroxide-simeth (MAALOX/MYLANTA) 200-200-20 MG/5ML suspension 30 mL  30 mL Oral Q6H PRN [name], [name], NP       apixaban  (ELIQUIS ) tablet 2.5 mg  2.5 mg Oral BID [name], [name], NP   2.5 mg at 05/03/24 1624   cloZAPine  (CLOZARIL ) tablet 250 mg  250 mg Oral QHS [name], [name], NP   250 mg at 05/02/24 2125   haloperidol  (HALDOL ) tablet 5 mg  5 mg Oral TID PRN [name], [name], NP       And   diphenhydrAMINE  (BENADRYL ) capsule 50 mg  50 mg Oral TID PRN [name], [name], NP       haloperidol  lactate (HALDOL ) injection 5 mg  5 mg Intramuscular TID PRN [name], [name], NP       And   diphenhydrAMINE  (BENADRYL ) injection 50 mg  50 mg Intramuscular TID PRN [name], [name], NP       And   LORazepam  (ATIVAN ) injection 2 mg  2 mg Intramuscular TID PRN [name], [name], NP       haloperidol  lactate (HALDOL ) injection 10 mg  10 mg Intramuscular TID PRN [name], [name], NP       And   diphenhydrAMINE  (BENADRYL ) injection 50 mg  50 mg Intramuscular TID PRN [name], [name], NP       And   LORazepam  (ATIVAN ) injection 2 mg  2 mg Intramuscular TID PRN [name], [name], NP       fluticasone  furoate-vilanterol (BREO ELLIPTA ) 200-25 MCG/ACT 1 puff  1 puff Inhalation Daily [name], [name], NP   1 puff at 05/02/24 9177   lisinopril  (ZESTRIL ) tablet 2.5 mg  2.5 mg Oral Daily [name], [name], NP   2.5 mg at 05/03/24 9166   lithium  carbonate (ESKALITH ) ER tablet 450 mg  450 mg Oral QHS [name], [name], NP   450 mg at 05/02/24 2127   LORazepam   (ATIVAN ) tablet 1 mg  1 mg Oral BID Kalie Cabral L, PA-C       melatonin tablet 5 mg  5 mg Oral QHS [name], [name], NP   5 mg at 05/02/24 2126   metFORMIN  (GLUCOPHAGE ) tablet 1,000 mg  1,000 mg Oral BID WC [name], [name], NP   1,000 mg at 05/03/24 1624   nicotine  (NICODERM CQ  - dosed in mg/24 hours) patch 14 mg  14 mg Transdermal Daily [name], [name], MD   14 mg at 05/03/24 9166   propranolol  (INDERAL ) tablet 20 mg  20 mg Oral QHS [name], [name], NP   20 mg at 05/02/24 2126   risperiDONE  (RISPERDAL ) tablet 1 mg  1 mg Oral BID [name], [name], NP   1 mg at 05/03/24 1624   simvastatin  (ZOCOR ) tablet 40 mg  40 mg Oral q1800 [name], [name], NP   40 mg at 05/02/24 2130   traZODone  (DESYREL ) tablet 100 mg  100 mg Oral QHS [name], [name], NP   100 mg at 05/02/24 2126    Lab Results:  Results for orders placed or performed during the hospital encounter of 05/01/24 (from the past 48 hours)  Lipid panel     Status: Abnormal   Collection Time: 05/02/24  5:43 AM  Result Value Ref Range   Cholesterol 147 0 - 200 mg/dL    Comment:        ATP III CLASSIFICATION:  <200     mg/dL   Desirable  799-760  mg/dL   Borderline High  >=759    mg/dL   High           Triglycerides 234 (H) <150 mg/dL   HDL 32 (L) >59 mg/dL   Total CHOL/HDL Ratio 4.6 RATIO   VLDL 47 (H) 0 - 40 mg/dL   LDL Cholesterol 68 0 - 99 mg/dL    Comment:        Total Cholesterol/HDL:CHD Risk Coronary Heart Disease Risk Table                     Men   Women  1/2 Average Risk   3.4   3.3  Average Risk       5.0   4.4  2 X Average Risk   9.6   7.1  3 X Average Risk  23.4   11.0        Use the calculated Patient Ratio above and the CHD Risk Table to determine the patient's CHD Risk.        ATP III CLASSIFICATION (LDL):  <100     mg/dL   Optimal  899-870  mg/dL   Near or Above                    Optimal  130-159  mg/dL   Borderline  839-810  mg/dL   High  >809     mg/dL   Very High Performed at [facility], [address] [place], [place] [address]     Blood Alcohol level:  Lab Results  Component Value Date   [facility] <15 04/30/2024   ETH <15 03/10/2024    Metabolic Disorder Labs: Lab Results  Component Value Date   HGBA1C 4.9 09/10/2023   MPG 93.93 09/10/2023   MPG 93.93 04/26/2022   Lab Results  Component Value Date   PROLACTIN 2.0 (L) 08/31/2016   PROLACTIN 4.8 05/22/2016   Lab Results  Component Value Date   CHOL 147 05/02/2024   TRIG 234 (H) 05/02/2024   HDL 32 (L) 05/02/2024   CHOLHDL 4.6 05/02/2024   VLDL 47 (H) 05/02/2024   LDLCALC 68 05/02/2024   LDLCALC 52 04/28/2022    Physical Findings: AIMS:  , ,  ,  ,  CIWA:    COWS:      Psychiatric Specialty Exam:  Presentation  General Appearance:  Disheveled  Eye Contact: Poor  Speech: Garbled  Speech Volume: Decreased    Mood and Affect  Mood: Depressed; Anxious  Affect: Congruent   Thought Process  Thought Processes: Linear  Orientation:Full (Time, Place and Person)  Thought Content:WDL  Hallucinations: None  Ideas of Reference: None Suicidal Thoughts:Suicidal Thoughts: Yes, Passive  Homicidal Thoughts:Homicidal Thoughts: No   Sensorium  Memory: Immediate Fair; Recent Fair; Remote Poor  Judgment: Fair  Insight: Fair   Chartered Certified Accountant: Fair  Attention Span: Fair  Recall: [employer] of Knowledge: Fair  Language: Fair   Psychomotor Activity  Psychomotor Activity: Psychomotor Activity: Normal  Musculoskeletal: Strength & Muscle Tone: within normal limits Gait & Station: normal Assets  Assets: Manufacturing Systems Engineer; Desire for Improvement    Physical Exam: Physical Exam ROS Blood pressure 120/73, pulse 89, temperature 97.7 F (36.5 C), temperature source Oral, resp. rate 18, height 5' 10 (1.778 m), weight 90.7 kg, SpO2 98%. Body mass index is 28.7 kg/m.  Diagnosis: Principal Problem:   GAD (generalized anxiety  disorder)   PLAN: Safety and Monitoring:  -- Voluntary admission to inpatient psychiatric unit for safety, stabilization and treatment  -- Daily contact with patient to assess and evaluate symptoms and progress in treatment  -- Patient's case to be discussed in multi-disciplinary team meeting  -- Observation Level : q15 minute checks  -- Vital signs:  q12 hours  -- Precautions: suicide, elopement, and assault -- Encouraged patient to participate in unit milieu and in scheduled group therapies  2. Psychiatric Treatment:  Scheduled Medications:  Home psychiatric medications continued: Clozapine  200 mg at bedtime Lithium  ER 450 mg at bedtime Lorazepam  1 mg twice daily  Melatonin 5 mg at bedtime Propranolol  20 mg daily at bedtime Risperidone  1 mg twice daily Trazodone  100 mg daily at bedtime                -- The risks/benefits/side-effects/alternatives to this medication were discussed in detail with the patient and time was given for questions. The patient consents to medication trial.  3. Medical Issues Being Addressed:  Home medications continued for medical comorbidities.   4. Discharge Planning:   -- Social work and case management to assist with discharge planning and identification of hospital follow-up needs prior to discharge  -- Estimated LOS: 5-7 days  Camelia LITTIE Lukes, PA-C 05/03/2024, 4:46 PM

## 2024-05-03 NOTE — Group Note (Signed)
 Date:  05/03/2024 Time:  8:37 PM  Group Topic/Focus:  Wrap-Up Group:   The focus of this group is to help patients review their daily goal of treatment and discuss progress on daily workbooks.    Participation Level:  Active  Participation Quality:  Appropriate and Attentive  Affect:  Appropriate  Cognitive:  Alert and Appropriate  Insight: Appropriate and Good  Engagement in Group:  Engaged  Modes of Intervention:  Orientation  Additional Comments:     Arlester CHRISTELLA Servant 05/03/2024, 8:37 PM

## 2024-05-03 NOTE — Progress Notes (Signed)
   05/03/24 2100  Psych Admission Type (Psych Patients Only)  Admission Status Voluntary  Psychosocial Assessment  Patient Complaints Sleep disturbance;Anxiety  Eye Contact Fair  Facial Expression Flat  Affect Depressed  Speech Soft;Slow  Interaction Assertive  Motor Activity Slow  Appearance/Hygiene Disheveled  Behavior Characteristics Cooperative  Mood Depressed;Pleasant  Thought Process  Coherency WDL  Content WDL  Delusions None reported or observed  Perception WDL  Hallucination None reported or observed  Judgment Limited  Confusion None  Danger to Self  Current suicidal ideation? Denies  Self-Injurious Behavior No self-injurious ideation or behavior indicators observed or expressed   Danger to Others  Danger to Others None reported or observed

## 2024-05-03 NOTE — Group Note (Signed)
 Date:  05/03/2024 Time:  10:54 AM  Group Topic/Focus:  Early Warning Signs:   The focus of this group is to help patients identify signs or symptoms they exhibit before slipping into an unhealthy state or crisis.    Participation Level:  Did Not Attend   Lance [name] 05/03/2024, 10:54 AM

## 2024-05-03 NOTE — Group Note (Signed)
 Date:  05/03/2024 Time:  5:06 PM  Group Topic/Focus:  Wellness Toolbox:   The focus of this group is to discuss various aspects of wellness, balancing those aspects and exploring ways to increase the ability to experience wellness.  Patients will create a wellness toolbox for use upon discharge.    Participation Level:  Active  Participation Quality:  Appropriate  Affect:  Appropriate  Cognitive:  Appropriate  Insight: Appropriate  Engagement in Group:  Engaged  Modes of Intervention:  Activity and Socialization  Additional Comments:    Lance [name] 05/03/2024, 5:06 PM

## 2024-05-03 NOTE — Plan of Care (Signed)
  Problem: Education: Goal: Emotional status will improve Outcome: Progressing Goal: Mental status will improve Outcome: Progressing   Problem: Activity: Goal: Interest or engagement in activities will improve Outcome: Progressing   Problem: Health Behavior/Discharge Planning: Goal: Compliance with treatment plan for underlying cause of condition will improve Outcome: Progressing   Problem: Physical Regulation: Goal: Ability to maintain clinical measurements within normal limits will improve Outcome: Progressing   Problem: Safety: Goal: Periods of time without injury will increase Outcome: Progressing

## 2024-05-03 NOTE — Plan of Care (Signed)

## 2024-05-03 NOTE — Progress Notes (Signed)
   05/03/24 0900  Psych Admission Type (Psych Patients Only)  Admission Status Voluntary  Psychosocial Assessment  Patient Complaints Anxiety;Sleep disturbance ( Couldn't sleep  that makes him anxious.)  Eye Contact Fair  Facial Expression Flat  Affect Blunted  Speech Soft;Slow  Interaction Assertive  Motor Activity Slow  Appearance/Hygiene Disheveled  Behavior Characteristics Cooperative;Appropriate to situation  Mood Anxious  Thought Process  Coherency WDL  Content WDL  Delusions None reported or observed  Perception WDL  Hallucination None reported or observed  Judgment Impaired  Confusion None  Danger to Self  Current suicidal ideation? Denies  Agreement Not to Harm Self Yes  Description of Agreement verbal  Danger to Others  Danger to Others None reported or observed   Patient stated that his anxiety is bad because he couldn't sleep at night. Ativan  1mg  given with good result. Patient visible in the milieu.

## 2024-05-03 NOTE — BH IP Treatment Plan (Signed)
 Interdisciplinary Treatment and Diagnostic Plan Update  05/03/2024 Time of Session: 10:14 AM Lance [name] MRN: [id number]  Principal Diagnosis: GAD (generalized anxiety disorder)  Secondary Diagnoses: Principal Problem:   GAD (generalized anxiety disorder)   Current Medications:  Current Facility-Administered Medications  Medication Dose Route Frequency Provider Last Rate Last Admin   acetaminophen  (TYLENOL ) tablet 650 mg  650 mg Oral Q6H PRN [name], [name], NP       albuterol  (VENTOLIN  HFA) 108 (90 Base) MCG/ACT inhaler 2 puff  2 puff Inhalation Q6H PRN [name], [name], NP       alum & mag hydroxide-simeth (MAALOX/MYLANTA) 200-200-20 MG/5ML suspension 30 mL  30 mL Oral Q6H PRN [name], [name], NP       apixaban  (ELIQUIS ) tablet 2.5 mg  2.5 mg Oral BID [name], [name], NP   2.5 mg at 05/03/24 9141   cloZAPine  (CLOZARIL ) tablet 250 mg  250 mg Oral QHS [name], [name], NP   250 mg at 05/02/24 2125   haloperidol  (HALDOL ) tablet 5 mg  5 mg Oral TID PRN [name], [name], NP       And   diphenhydrAMINE  (BENADRYL ) capsule 50 mg  50 mg Oral TID PRN [name], [name], NP       haloperidol  lactate (HALDOL ) injection 5 mg  5 mg Intramuscular TID PRN [name], [name], NP       And   diphenhydrAMINE  (BENADRYL ) injection 50 mg  50 mg Intramuscular TID PRN [name], [name], NP       And   LORazepam  (ATIVAN ) injection 2 mg  2 mg Intramuscular TID PRN [name], [name], NP       haloperidol  lactate (HALDOL ) injection 10 mg  10 mg Intramuscular TID PRN [name], [name], NP       And   diphenhydrAMINE  (BENADRYL ) injection 50 mg  50 mg Intramuscular TID PRN [name], [name], NP       And   LORazepam  (ATIVAN ) injection 2 mg  2 mg Intramuscular TID PRN [name], [name], NP       fluticasone  furoate-vilanterol (BREO ELLIPTA ) 200-25 MCG/ACT 1 puff  1 puff Inhalation Daily [name], [name], NP   1 puff at 05/02/24 9177   lisinopril  (ZESTRIL ) tablet 2.5 mg  2.5 mg Oral Daily [name], [name], NP   2.5 mg at 05/03/24 9166    lithium  carbonate (ESKALITH ) ER tablet 450 mg  450 mg Oral QHS [name], [name], NP   450 mg at 05/02/24 2127   LORazepam  (ATIVAN ) tablet 1 mg  1 mg Oral Q6H PRN [name], [name], NP   1 mg at 05/03/24 0858   melatonin tablet 5 mg  5 mg Oral QHS [name], [name], NP   5 mg at 05/02/24 2126   metFORMIN  (GLUCOPHAGE ) tablet 1,000 mg  1,000 mg Oral BID WC [name], [name], NP   1,000 mg at 05/03/24 9166   nicotine  (NICODERM CQ  - dosed in mg/24 hours) patch 14 mg  14 mg Transdermal Daily [name], [name], MD   14 mg at 05/03/24 9166   propranolol  (INDERAL ) tablet 20 mg  20 mg Oral QHS [name], [name], NP   20 mg at 05/02/24 2126   risperiDONE (RISPERDAL) tablet 1 mg  1 mg Oral BID [name], [name], NP   1 mg at 05/03/24 9166   simvastatin  (ZOCOR ) tablet 40 mg  40 mg Oral q1800 [name], [name], NP   40 mg at 05/02/24 2130  traZODone  (DESYREL ) tablet 100 mg  100 mg Oral QHS [name], [name], NP   100 mg at 05/02/24 2126   PTA Medications: Medications Prior to Admission  Medication Sig Dispense Refill Last Dose/Taking   albuterol  (VENTOLIN  HFA) 108 (90 Base) MCG/ACT inhaler Inhale 2 puffs into the lungs every 6 (six) hours as needed for wheezing or shortness of breath.      clonazePAM  (KLONOPIN ) 0.25 MG disintegrating tablet Take 1 tablet (0.25 mg total) by mouth 2 (two) times daily as needed for seizure. 60 tablet 0    cloZAPine  (CLOZARIL ) 50 MG tablet Take 5 tablets (250 mg total) by mouth at bedtime. 150 tablet 0    ELIQUIS  2.5 MG TABS tablet Take 2.5 mg by mouth 2 (two) times daily.      fluticasone -salmeterol (ADVAIR) 250-50 MCG/ACT AEPB Inhale 1 puff into the lungs 2 (two) times daily. 60 each 1    lisinopril  (ZESTRIL ) 2.5 MG tablet Take 1 tablet (2.5 mg total) by mouth daily. 30 tablet 1    lithium  carbonate (ESKALITH ) 450 MG ER tablet Take 1 tablet (450 mg total) by mouth at bedtime. 30 tablet 1    LORazepam  (ATIVAN ) 1 MG tablet Take 1 mg by mouth every 6 (six) hours as needed (anxiety).      melatonin 5 MG  TABS Take 5 mg by mouth at bedtime.      metFORMIN  (GLUCOPHAGE ) 1000 MG tablet Take 1 tablet (1,000 mg total) by mouth 2 (two) times daily with a meal. 60 tablet 1    NOVOFINE AUTOCOVER PEN NEEDLE 30G X 8 MM MISC USE WITH LEVEMIR  TWICE DAILY. (Patient not taking: Reported on 09/11/2023) 100 each 11    OLANZapine  (ZYPREXA ) 20 MG tablet Take 1 tablet (20 mg total) by mouth at bedtime. (Patient not taking: Reported on 04/30/2024) 30 tablet 1    ondansetron  (ZOFRAN -ODT) 4 MG disintegrating tablet Take 4 mg by mouth 2 (two) times daily.      propranolol  (INDERAL ) 20 MG tablet Take 1 tablet (20 mg total) by mouth at bedtime. 30 tablet 1    risperiDONE (RISPERDAL) 1 MG tablet Take 1 mg by mouth 2 (two) times daily.      sertraline  (ZOLOFT ) 100 MG tablet Take 1 tablet (100 mg total) by mouth daily. (Patient not taking: Reported on 04/30/2024) 30 tablet 1    simvastatin  (ZOCOR ) 40 MG tablet Take 1 tablet (40 mg total) by mouth daily at 6 PM. 30 tablet 1    traZODone  (DESYREL ) 100 MG tablet Take 1 tablet (100 mg total) by mouth at bedtime. 30 tablet 1     Patient Stressors: Other: Relationships    Patient Strengths: Other: Patient reports being a good listening and being friendly.   Treatment Modalities: Medication Management, Group therapy, Case management,  1 to 1 session with clinician, Psychoeducation, Recreational therapy.   Physician Treatment Plan for Primary Diagnosis: GAD (generalized anxiety disorder) Long Term Goal(s): Improvement in symptoms so as ready for discharge   Short Term Goals: Ability to identify changes in lifestyle to reduce recurrence of condition will improve Ability to verbalize feelings will improve Ability to demonstrate self-control will improve Ability to identify and develop effective coping behaviors will improve  Medication Management: Evaluate patient's response, side effects, and tolerance of medication regimen.  Therapeutic Interventions: 1 to 1 sessions,  Unit Group sessions and Medication administration.  Evaluation of Outcomes: Not Met  Physician Treatment Plan for Secondary Diagnosis: Principal Problem:   GAD (generalized anxiety disorder)  Long Term Goal(s): Improvement in symptoms so as ready for discharge   Short Term Goals: Ability to identify changes in lifestyle to reduce recurrence of condition will improve Ability to verbalize feelings will improve Ability to demonstrate self-control will improve Ability to identify and develop effective coping behaviors will improve     Medication Management: Evaluate patient's response, side effects, and tolerance of medication regimen.  Therapeutic Interventions: 1 to 1 sessions, Unit Group sessions and Medication administration.  Evaluation of Outcomes: Not Met   RN Treatment Plan for Primary Diagnosis: GAD (generalized anxiety disorder) Long Term Goal(s): Knowledge of disease and therapeutic regimen to maintain health will improve  Short Term Goals: Ability to verbalize frustration and anger appropriately will improve, Ability to demonstrate self-control, Ability to participate in decision making will improve, Ability to verbalize feelings will improve, Ability to disclose and discuss suicidal ideas, and Ability to identify and develop effective coping behaviors will improve  Medication Management: RN will administer medications as ordered by provider, will assess and evaluate patient's response and provide education to patient for prescribed medication. RN will report any adverse and/or side effects to prescribing provider.  Therapeutic Interventions: 1 on 1 counseling sessions, Psychoeducation, Medication administration, Evaluate responses to treatment, Monitor vital signs and CBGs as ordered, Perform/monitor CIWA, COWS, AIMS and Fall Risk screenings as ordered, Perform wound care treatments as ordered.  Evaluation of Outcomes: Not Met   LCSW Treatment Plan for Primary Diagnosis: GAD  (generalized anxiety disorder) Long Term Goal(s): Safe transition to appropriate next level of care at discharge, Engage patient in therapeutic group addressing interpersonal concerns.  Short Term Goals: Engage patient in aftercare planning with referrals and resources, Increase social support, Increase ability to appropriately verbalize feelings, Increase emotional regulation, Facilitate acceptance of mental health diagnosis and concerns, Facilitate patient progression through stages of change regarding substance use diagnoses and concerns, Identify triggers associated with mental health/substance abuse issues, and Increase skills for wellness and recovery  Therapeutic Interventions: Assess for all discharge needs, 1 to 1 time with Social worker, Explore available resources and support systems, Assess for adequacy in community support network, Educate family and significant other(s) on suicide prevention, Complete Psychosocial Assessment, Interpersonal group therapy.  Evaluation of Outcomes: Not Met   Progress in Treatment: Attending groups: Yes. and No. Participating in groups: Yes. and No. Taking medication as prescribed: Yes. Toleration medication: Yes. Family/Significant other contact made: Yes, individual(s) contacted:  [name], legal guardian, [phone] Patient understands diagnosis: Yes. Discussing patient identified problems/goals with staff: Yes. Medical problems stabilized or resolved: Yes. Denies suicidal/homicidal ideation: Yes. Issues/concerns per patient self-inventory: No. Other: None  New problem(s) identified: No, Describe:  None  New Short Term/Long Term Goal(s):detox, elimination of symptoms of psychosis, medication management for mood stabilization; elimination of SI thoughts; development of comprehensive mental wellness/sobriety plan.    Patient Goals:  Get this anxiety under control.  Discharge Plan or Barriers: CSW to assist with the development of  appropriate discharge plan.    Reason for Continuation of Hospitalization: Anxiety Depression Suicidal ideation  Estimated Length of Stay:  Last 3 [place] Suicide Severity Risk Score: Flowsheet Row Admission (Current) from 05/01/2024 in [facility] INPATIENT BEHAVIORAL MEDICINE ED from 04/30/2024 in [facility] Emergency Department at [facility] ED from 03/10/2024 in [facility] Emergency Department at [facility]  C-SSRS RISK CATEGORY Low Risk Low Risk No Risk    Last PHQ 2/9 Scores:    03/20/2021   11:14 AM 03/20/2021   11:04 AM  Depression  screen PHQ 2/9  Decreased Interest 0 0  Down, Depressed, Hopeless 0 0  PHQ - 2 Score 0 0    Scribe for Treatment Team: Alveta CHRISTELLA Kerns, KEN 05/03/2024 12:53 PM

## 2024-05-03 NOTE — [facility] Counselor (Signed)
 CSW received a phone call from guardian, [name] [phone]). She was informed that consents needed to be completed for pt. Harris confirmed that consents could be sent to her email at [email or web address]. CSW inquired regarding pt current outpatient mental health providers. She shared that she was unable to recall the name at the moment but would send it back with the completed consents. CSW also asked if communication with the family care home was granted. She agreed. No other concerns expressed. Contact ended without incident.   CSW emailed consents, authorizations, and IM to [email or web address] for completion.   Nadara SAUNDERS. Chaim, MSW, LCSW, [name] 05/03/2024 12:49 PM

## 2024-05-03 NOTE — Group Note (Signed)
 [facility] LCSW Group Therapy Note   Group Date: 05/03/2024 Start Time: 1300 End Time: 1400   Type of Therapy/Topic:  Group Therapy:  Emotion Regulation  Participation Level:  Minimal   Description of Group:    The purpose of this group is to assist patients in learning to regulate negative emotions and experience positive emotions. Patients will be guided to discuss ways in which they have been vulnerable to their negative emotions. These vulnerabilities will be juxtaposed with experiences of positive emotions or situations, and patients challenged to use positive emotions to combat negative ones. Special emphasis will be placed on coping with negative emotions in conflict situations, and patients will process healthy conflict resolution skills.  Therapeutic Goals: Patient will identify two positive emotions or experiences to reflect on in order to balance out negative emotions:  Patient will label two or more emotions that they find the most difficult to experience:  Patient will be able to demonstrate positive conflict resolution skills through discussion or role plays:   Summary of Patient Progress: Patient was present for a majority of the group. He was minimally involved in the conversation while in the room. Pt insight remains questionable. He did appear open and receptive to feedback from peers and facilitator.   Therapeutic Modalities:   Cognitive Behavioral Therapy Feelings Identification Dialectical Behavioral Therapy   Nadara JONELLE Fam, LCSW

## 2024-05-03 NOTE — BH IP Treatment Plan (Signed)
 Interdisciplinary Treatment and Diagnostic Plan Update  05/03/2024 Time of Session: 10:!4 AM Lance [name] MRN: [id number]  Principal Diagnosis: GAD (generalized anxiety disorder)  Secondary Diagnoses: Principal Problem:   GAD (generalized anxiety disorder)   Current Medications:  Current Facility-Administered Medications  Medication Dose Route Frequency Provider Last Rate Last Admin   acetaminophen  (TYLENOL ) tablet 650 mg  650 mg Oral Q6H PRN [name], [name], NP       albuterol  (VENTOLIN  HFA) 108 (90 Base) MCG/ACT inhaler 2 puff  2 puff Inhalation Q6H PRN [name], [name], NP       alum & mag hydroxide-simeth (MAALOX/MYLANTA) 200-200-20 MG/5ML suspension 30 mL  30 mL Oral Q6H PRN [name], [name], NP       apixaban  (ELIQUIS ) tablet 2.5 mg  2.5 mg Oral BID [name], [name], NP   2.5 mg at 05/03/24 9141   cloZAPine  (CLOZARIL ) tablet 250 mg  250 mg Oral QHS [name], [name], NP   250 mg at 05/02/24 2125   haloperidol  (HALDOL ) tablet 5 mg  5 mg Oral TID PRN [name], [name], NP       And   diphenhydrAMINE  (BENADRYL ) capsule 50 mg  50 mg Oral TID PRN [name], [name], NP       haloperidol  lactate (HALDOL ) injection 5 mg  5 mg Intramuscular TID PRN [name], [name], NP       And   diphenhydrAMINE  (BENADRYL ) injection 50 mg  50 mg Intramuscular TID PRN [name], [name], NP       And   LORazepam  (ATIVAN ) injection 2 mg  2 mg Intramuscular TID PRN [name], [name], NP       haloperidol  lactate (HALDOL ) injection 10 mg  10 mg Intramuscular TID PRN [name], [name], NP       And   diphenhydrAMINE  (BENADRYL ) injection 50 mg  50 mg Intramuscular TID PRN [name], [name], NP       And   LORazepam  (ATIVAN ) injection 2 mg  2 mg Intramuscular TID PRN [name], [name], NP       fluticasone  furoate-vilanterol (BREO ELLIPTA ) 200-25 MCG/ACT 1 puff  1 puff Inhalation Daily [name], NP   1 puff at 05/02/24 9177   lisinopril  (ZESTRIL ) tablet 2.5 mg  2.5 mg Oral Daily [name], [name], NP   2.5 mg at 05/03/24 9166    lithium  carbonate (ESKALITH ) ER tablet 450 mg  450 mg Oral QHS [name], [name], NP   450 mg at 05/02/24 2127   LORazepam  (ATIVAN ) tablet 1 mg  1 mg Oral Q6H PRN [name], [name], NP   1 mg at 05/03/24 0858   melatonin tablet 5 mg  5 mg Oral QHS [name], [name], NP   5 mg at 05/02/24 2126   metFORMIN  (GLUCOPHAGE ) tablet 1,000 mg  1,000 mg Oral BID WC [name], [name], NP   1,000 mg at 05/03/24 9166   nicotine  (NICODERM CQ  - dosed in mg/24 hours) patch 14 mg  14 mg Transdermal Daily [name], [name], MD   14 mg at 05/03/24 9166   propranolol  (INDERAL ) tablet 20 mg  20 mg Oral QHS [name], [name], NP   20 mg at 05/02/24 2126   risperiDONE (RISPERDAL) tablet 1 mg  1 mg Oral BID [name], [name], NP   1 mg at 05/03/24 9166   simvastatin  (ZOCOR ) tablet 40 mg  40 mg Oral q1800 [name], [name], NP   40 mg at 05/02/24 2130  traZODone  (DESYREL ) tablet 100 mg  100 mg Oral QHS [name], [name], NP   100 mg at 05/02/24 2126   PTA Medications: Medications Prior to Admission  Medication Sig Dispense Refill Last Dose/Taking   albuterol  (VENTOLIN  HFA) 108 (90 Base) MCG/ACT inhaler Inhale 2 puffs into the lungs every 6 (six) hours as needed for wheezing or shortness of breath.      clonazePAM  (KLONOPIN ) 0.25 MG disintegrating tablet Take 1 tablet (0.25 mg total) by mouth 2 (two) times daily as needed for seizure. 60 tablet 0    cloZAPine  (CLOZARIL ) 50 MG tablet Take 5 tablets (250 mg total) by mouth at bedtime. 150 tablet 0    ELIQUIS  2.5 MG TABS tablet Take 2.5 mg by mouth 2 (two) times daily.      fluticasone -salmeterol (ADVAIR) 250-50 MCG/ACT AEPB Inhale 1 puff into the lungs 2 (two) times daily. 60 each 1    lisinopril  (ZESTRIL ) 2.5 MG tablet Take 1 tablet (2.5 mg total) by mouth daily. 30 tablet 1    lithium  carbonate (ESKALITH ) 450 MG ER tablet Take 1 tablet (450 mg total) by mouth at bedtime. 30 tablet 1    LORazepam  (ATIVAN ) 1 MG tablet Take 1 mg by mouth every 6 (six) hours as needed (anxiety).      melatonin 5 MG  TABS Take 5 mg by mouth at bedtime.      metFORMIN  (GLUCOPHAGE ) 1000 MG tablet Take 1 tablet (1,000 mg total) by mouth 2 (two) times daily with a meal. 60 tablet 1    NOVOFINE AUTOCOVER PEN NEEDLE 30G X 8 MM MISC USE WITH LEVEMIR  TWICE DAILY. (Patient not taking: Reported on 09/11/2023) 100 each 11    OLANZapine  (ZYPREXA ) 20 MG tablet Take 1 tablet (20 mg total) by mouth at bedtime. (Patient not taking: Reported on 04/30/2024) 30 tablet 1    ondansetron  (ZOFRAN -ODT) 4 MG disintegrating tablet Take 4 mg by mouth 2 (two) times daily.      propranolol  (INDERAL ) 20 MG tablet Take 1 tablet (20 mg total) by mouth at bedtime. 30 tablet 1    risperiDONE (RISPERDAL) 1 MG tablet Take 1 mg by mouth 2 (two) times daily.      sertraline  (ZOLOFT ) 100 MG tablet Take 1 tablet (100 mg total) by mouth daily. (Patient not taking: Reported on 04/30/2024) 30 tablet 1    simvastatin  (ZOCOR ) 40 MG tablet Take 1 tablet (40 mg total) by mouth daily at 6 PM. 30 tablet 1    traZODone  (DESYREL ) 100 MG tablet Take 1 tablet (100 mg total) by mouth at bedtime. 30 tablet 1     Patient Stressors: Other: Relationships    Patient Strengths: Other: Patient reports being a good listening and being friendly.   Treatment Modalities: Medication Management, Group therapy, Case management,  1 to 1 session with clinician, Psychoeducation, Recreational therapy.   Physician Treatment Plan for Primary Diagnosis: GAD (generalized anxiety disorder) Long Term Goal(s): Improvement in symptoms so as ready for discharge   Short Term Goals: Ability to identify changes in lifestyle to reduce recurrence of condition will improve Ability to verbalize feelings will improve Ability to demonstrate self-control will improve Ability to identify and develop effective coping behaviors will improve  Medication Management: Evaluate patient's response, side effects, and tolerance of medication regimen.  Therapeutic Interventions: 1 to 1 sessions,  Unit Group sessions and Medication administration.  Evaluation of Outcomes: Progressing  Physician Treatment Plan for Secondary Diagnosis: Principal Problem:   GAD (generalized anxiety disorder)  Long Term Goal(s): Improvement in symptoms so as ready for discharge   Short Term Goals: Ability to identify changes in lifestyle to reduce recurrence of condition will improve Ability to verbalize feelings will improve Ability to demonstrate self-control will improve Ability to identify and develop effective coping behaviors will improve     Medication Management: Evaluate patient's response, side effects, and tolerance of medication regimen.  Therapeutic Interventions: 1 to 1 sessions, Unit Group sessions and Medication administration.  Evaluation of Outcomes: Progressing   RN Treatment Plan for Primary Diagnosis: GAD (generalized anxiety disorder) Long Term Goal(s): Knowledge of disease and therapeutic regimen to maintain health will improve  Short Term Goals: Ability to verbalize frustration and anger appropriately will improve, Ability to demonstrate self-control, Ability to participate in decision making will improve, Ability to verbalize feelings will improve, Ability to disclose and discuss suicidal ideas, and Ability to identify and develop effective coping behaviors will improve  Medication Management: RN will administer medications as ordered by provider, will assess and evaluate patient's response and provide education to patient for prescribed medication. RN will report any adverse and/or side effects to prescribing provider.  Therapeutic Interventions: 1 on 1 counseling sessions, Psychoeducation, Medication administration, Evaluate responses to treatment, Monitor vital signs and CBGs as ordered, Perform/monitor CIWA, COWS, AIMS and Fall Risk screenings as ordered, Perform wound care treatments as ordered.  Evaluation of Outcomes: Not Progressing   LCSW Treatment Plan for  Primary Diagnosis: GAD (generalized anxiety disorder) Long Term Goal(s): Safe transition to appropriate next level of care at discharge, Engage patient in therapeutic group addressing interpersonal concerns.  Short Term Goals: Engage patient in aftercare planning with referrals and resources, Increase social support, Increase ability to appropriately verbalize feelings, Increase emotional regulation, Facilitate acceptance of mental health diagnosis and concerns, Facilitate patient progression through stages of change regarding substance use diagnoses and concerns, Identify triggers associated with mental health/substance abuse issues, and Increase skills for wellness and recovery  Therapeutic Interventions: Assess for all discharge needs, 1 to 1 time with Social worker, Explore available resources and support systems, Assess for adequacy in community support network, Educate family and significant other(s) on suicide prevention, Complete Psychosocial Assessment, Interpersonal group therapy.  Evaluation of Outcomes: Not Progressing   Progress in Treatment: Attending groups: Yes. and No. Participating in groups: Yes. and No. Taking medication as prescribed: Yes. Toleration medication: Yes. Family/Significant other contact made: Yes, individual(s) contacted:   [name], legal guardian, [phone] Patient understands diagnosis: Yes. Discussing patient identified problems/goals with staff: Yes. Medical problems stabilized or resolved: Yes. Denies suicidal/homicidal ideation: Yes. Issues/concerns per patient self-inventory: No. Other: None  New problem(s) identified: No, Describe:  None  New Short Term/Long Term Goal(s):detox, elimination of symptoms of psychosis, medication management for mood stabilization; elimination of SI thoughts; development of comprehensive mental wellness/sobriety plan.    Patient Goals:  Get this anxiety under control.  Discharge Plan or Barriers: CSW to  assist with the development of appropriate discharge plan.    Reason for Continuation of Hospitalization: Anxiety Depression Suicidal ideation  Estimated Length of Stay: 1-7 days.   Last 3 [place] Suicide Severity Risk Score: Flowsheet Row Admission (Current) from 05/01/2024 in [facility] INPATIENT BEHAVIORAL MEDICINE ED from 04/30/2024 in [facility] Emergency Department at [facility] ED from 03/10/2024 in [facility] Emergency Department at [facility]  C-SSRS RISK CATEGORY Low Risk Low Risk No Risk    Last PHQ 2/9 Scores:    03/20/2021   11:14 AM 03/20/2021   11:04  AM  Depression screen PHQ 2/9  Decreased Interest 0 0  Down, Depressed, Hopeless 0 0  PHQ - 2 Score 0 0    Scribe for Treatment Team: Alveta CHRISTELLA Kerns, LCSW 05/03/2024 10:49 AM

## 2024-05-04 MED ORDER — HYDROXYZINE HCL 25 MG PO TABS
25.0000 mg | ORAL_TABLET | Freq: Three times a day (TID) | ORAL | Status: DC | PRN
  Administered 2024-05-04 – 2024-05-07 (×2): 25 mg via ORAL
  Filled 2024-05-04 (×2): qty 1

## 2024-05-04 NOTE — Group Note (Signed)
 LCSW Group Therapy Note  Group Date: 05/04/2024 Start Time: 1300 End Time: 1400   Type of Therapy and Topic:  Group Therapy: Using I Statements  Participation Level:  Did Not Attend  Description of Group:  Patients were asked to provide details of some interpersonal conflicts they have experienced. Patients were then educated about "I" statements, communication which focuses on feelings or views of the speaker rather than what the other person is doing. T group members were asked to reflect on past conflicts and to provide specific examples for utilizing "I" statements.  Therapeutic Goals:  Patients will verbalize understanding of ineffective communication and effective communication. Patients will be able to empathize with whom they are having conflict. Patients will practice effective communication in the form of "I" statements.    Summary of Patient Progress:  Patient did not attend.   Therapeutic Modalities:   Cognitive Behavioral Therapy Solution-Focused Therapy    Alveta CHRISTELLA Kerns, LCSW 05/04/2024  2:04 PM

## 2024-05-04 NOTE — Progress Notes (Signed)
 [facility] MD Progress Note  05/04/2024 8:11 PM Lance [name]  MRN:  [id number]   Subjective:  Chart reviewed, case discussed in multidisciplinary meeting, patient seen during rounds.   11/20: On interview today, patient is noted to be calm and cooperative, alert and oriented. He reports ongoing anxiety, will add hydroxyzine  three times daily as needed for anxiety; discussed and approved by patient's guardian, [name]. Patient rates depression as 7/10 today. He reports some irritability. He denies SI/HI/plan and denies hallucinations. He reports improved sleep and stable appetite. He denies sedation. He denies adverse effects to medication regimen. He considers his anxiety his primary concern.   11/19: On interview today, patient is calm and cooperative, alert and oriented.  He rates depression as 10 out of 10 and when asked about anxiety he states he is feeling a lot of anxiety.  He reports taking Ativan  1 mg twice daily at home every day for approximately the last 2 weeks.  Will schedule Ativan  twice daily.  PDMP reviewed.  Patient is counseled on risks of long-term use of benzodiazepines.  Patient reports passive SI without intent or plan.  He denies HI/plan and denies hallucinations.  He reports fair sleep and appetite.  He is tolerating medication regimen well without adverse effects.  He is future oriented and reports understanding of the importance of medication compliance.  He does not voice any concerns or complaints at this time.  Past Psychiatric History: see h&P Family History:  Family History  Problem Relation Age of Onset   CAD Mother    Rheum arthritis Mother    Healthy Father    CAD Sister    Obesity Sister    Social History:  Social History   Substance and Sexual Activity  Alcohol Use Not Currently   Comment: occassionally     Social History   Substance and Sexual Activity  Drug Use Yes   Types: Marijuana   Comment: Patient reports occasionally    Social  History   Socioeconomic History   Marital status: Single    Spouse name: Not on file   Number of children: Not on file   Years of education: Not on file   Highest education level: Not on file  Occupational History   Not on file  Tobacco Use   Smoking status: Every Day    Current packs/day: 1.00    Average packs/day: 1 pack/day for 23.0 years (23.0 ttl pk-yrs)    Types: Cigarettes   Smokeless tobacco: Never   Tobacco comments:    will provide material  Vaping Use   Vaping status: Every Day  Substance and Sexual Activity   Alcohol use: Not Currently    Comment: occassionally   Drug use: Yes    Types: Marijuana    Comment: Patient reports occasionally   Sexual activity: Not Currently    Birth control/protection: None    Comment: occasional marijuana- none recently  Other Topics Concern   Not on file  Social History Narrative   From a group home in [place]   Social Drivers of Health   Financial Resource Strain: Low Risk  (03/20/2021)   Overall Financial Resource Strain (CARDIA)    Difficulty of Paying Living Expenses: Not very hard  Food Insecurity: No Food Insecurity (05/01/2024)   Hunger Vital Sign    Worried About Running Out of Food in the Last Year: Never true    Ran Out of Food in the Last Year: Never true  Transportation Needs: No Transportation Needs (  05/01/2024)   PRAPARE - Administrator, Civil Service (Medical): No    Lack of Transportation (Non-Medical): No  Physical Activity: Insufficiently Active (03/20/2021)   Exercise Vital Sign    Days of Exercise per Week: 3 days    Minutes of Exercise per Session: 20 min  Stress: No Stress Concern Present (03/20/2021)   [employer] of Occupational Health - Occupational Stress Questionnaire    Feeling of Stress : Only a little  Social Connections: Socially Isolated (03/20/2021)   Social Connection and Isolation Panel    Frequency of Communication with Friends and Family: More than three times a  week    Frequency of Social Gatherings with Friends and Family: More than three times a week    Attends Religious Services: Never    Database Administrator or Organizations: No    Attends Engineer, Structural: Never    Marital Status: Never married   Past Medical History:  Past Medical History:  Diagnosis Date   Depression    Diabetes mellitus without complication (HCC)    Hyperlipidemia    Hypertension    Lupus anticoagulant disorder 06/14/2012   as per Dr.pandit's note in dec 2013.   PE (pulmonary thromboembolism) (HCC)    Schizo affective schizophrenia (HCC)    Supratherapeutic INR 11/19/2016   History reviewed. No pertinent surgical history.  Current Medications: Current Facility-Administered Medications  Medication Dose Route Frequency Provider Last Rate Last Admin   acetaminophen  (TYLENOL ) tablet 650 mg  650 mg Oral Q6H PRN [name], [name], NP       albuterol  (VENTOLIN  HFA) 108 (90 Base) MCG/ACT inhaler 2 puff  2 puff Inhalation Q6H PRN [name], [name], NP       alum & mag hydroxide-simeth (MAALOX/MYLANTA) 200-200-20 MG/5ML suspension 30 mL  30 mL Oral Q6H PRN [name], [name], NP       apixaban  (ELIQUIS ) tablet 2.5 mg  2.5 mg Oral BID [name], [name], NP   2.5 mg at 05/04/24 1633   cloZAPine  (CLOZARIL ) tablet 250 mg  250 mg Oral QHS [name], [name], NP   250 mg at 05/03/24 2122   haloperidol  (HALDOL ) tablet 5 mg  5 mg Oral TID PRN [name], [name], NP       And   diphenhydrAMINE  (BENADRYL ) capsule 50 mg  50 mg Oral TID PRN [name], [name], NP       haloperidol  lactate (HALDOL ) injection 5 mg  5 mg Intramuscular TID PRN [name], [name], NP       And   diphenhydrAMINE  (BENADRYL ) injection 50 mg  50 mg Intramuscular TID PRN [name], [name], NP       And   LORazepam  (ATIVAN ) injection 2 mg  2 mg Intramuscular TID PRN [name], [name], NP       haloperidol  lactate (HALDOL ) injection 10 mg  10 mg Intramuscular TID PRN [name], [name], NP       And   diphenhydrAMINE  (BENADRYL )  injection 50 mg  50 mg Intramuscular TID PRN [name], [name], NP       And   LORazepam  (ATIVAN ) injection 2 mg  2 mg Intramuscular TID PRN [name], [name], NP       fluticasone  furoate-vilanterol (BREO ELLIPTA ) 200-25 MCG/ACT 1 puff  1 puff Inhalation Daily [name], [name], NP   1 puff at 05/04/24 0816   hydrOXYzine  (ATARAX ) tablet 25 mg  25 mg Oral TID PRN Chou Busler L, PA-C  25 mg at 05/04/24 1447   lisinopril  (ZESTRIL ) tablet 2.5 mg  2.5 mg Oral Daily [name], [name], NP   2.5 mg at 05/04/24 0801   lithium  carbonate (ESKALITH ) ER tablet 450 mg  450 mg Oral QHS [name], [name], NP   450 mg at 05/03/24 2122   LORazepam  (ATIVAN ) tablet 1 mg  1 mg Oral BID Alize Borrayo L, PA-C   1 mg at 05/04/24 0801   melatonin tablet 5 mg  5 mg Oral QHS [name], [name], NP   5 mg at 05/03/24 2122   metFORMIN  (GLUCOPHAGE ) tablet 1,000 mg  1,000 mg Oral BID WC [name], [name], NP   1,000 mg at 05/04/24 1633   nicotine  (NICODERM CQ  - dosed in mg/24 hours) patch 14 mg  14 mg Transdermal Daily [name], [name], MD   14 mg at 05/03/24 9166   propranolol  (INDERAL ) tablet 20 mg  20 mg Oral QHS [name], [name], NP   20 mg at 05/03/24 2122   risperiDONE  (RISPERDAL ) tablet 1 mg  1 mg Oral BID [name], [name], NP   1 mg at 05/04/24 1633   simvastatin  (ZOCOR ) tablet 40 mg  40 mg Oral q1800 [name], [name], NP   40 mg at 05/03/24 2123   traZODone  (DESYREL ) tablet 100 mg  100 mg Oral QHS [name], [name], NP   100 mg at 05/03/24 2122    Lab Results:  No results found for this or any previous visit (from the past 48 hours).   Blood Alcohol level:  Lab Results  Component Value Date   [facility] <15 04/30/2024   ETH <15 03/10/2024    Metabolic Disorder Labs: Lab Results  Component Value Date   HGBA1C 4.9 09/10/2023   MPG 93.93 09/10/2023   MPG 93.93 04/26/2022   Lab Results  Component Value Date   PROLACTIN 2.0 (L) 08/31/2016   PROLACTIN 4.8 05/22/2016   Lab Results  Component Value Date   CHOL 147 05/02/2024   TRIG  234 (H) 05/02/2024   HDL 32 (L) 05/02/2024   CHOLHDL 4.6 05/02/2024   VLDL 47 (H) 05/02/2024   LDLCALC 68 05/02/2024   LDLCALC 52 04/28/2022    Physical Findings: AIMS:  , ,  ,  ,    CIWA:    COWS:      Psychiatric Specialty Exam:  Presentation  General Appearance:  Disheveled  Eye Contact: Poor  Speech: Garbled  Speech Volume: Decreased    Mood and Affect  Mood: Depressed; Anxious  Affect: Congruent   Thought Process  Thought Processes: Linear  Orientation:Full (Time, Place and Person)  Thought Content:WDL  Hallucinations: None  Ideas of Reference: None Suicidal Thoughts: No  Homicidal Thoughts: No   Sensorium  Memory: Immediate Fair; Recent Fair; Remote Poor  Judgment: Fair  Insight: Fair   Chartered Certified Accountant: Fair  Attention Span: Fair  Recall: [employer] of Knowledge: Fair  Language: Fair   Psychomotor Activity  Psychomotor Activity: No data recorded  Musculoskeletal: Strength & Muscle Tone: within normal limits Gait & Station: normal Assets  Assets: Manufacturing Systems Engineer; Desire for Improvement    Physical Exam: Physical Exam ROS Blood pressure 121/83, pulse 88, temperature 97.7 F (36.5 C), temperature source Oral, resp. rate 16, height 5' 10 (1.778 m), weight 90.7 kg, SpO2 99%. Body mass index is 28.7 kg/m.  Diagnosis: Principal Problem:   GAD (generalized anxiety disorder)   PLAN: Safety and Monitoring:  -- Voluntary admission to  inpatient psychiatric unit for safety, stabilization and treatment  -- Daily contact with patient to assess and evaluate symptoms and progress in treatment  -- Patient's case to be discussed in multi-disciplinary team meeting  -- Observation Level : q15 minute checks  -- Vital signs:  q12 hours  -- Precautions: suicide, elopement, and assault -- Encouraged patient to participate in unit milieu and in scheduled group therapies  2. Psychiatric Treatment:   Scheduled Medications:  Home psychiatric medications continued: Clozapine  200 mg at bedtime Lithium  ER 450 mg at bedtime Lorazepam  1 mg twice daily  Melatonin 5 mg at bedtime Propranolol  20 mg daily at bedtime Risperidone  1 mg twice daily Trazodone  100 mg daily at bedtime              Added hydroxyzine  25 mg TID as needed for anxiety    -- The risks/benefits/side-effects/alternatives to this medication were discussed in detail with the patient and time was given for questions. The patient consents to medication trial.  3. Medical Issues Being Addressed:  Home medications continued for medical comorbidities.   4. Discharge Planning:   -- Social work and case management to assist with discharge planning and identification of hospital follow-up needs prior to discharge  -- Estimated LOS: 5-7 days  Camelia LITTIE Lukes, PA-C 05/04/2024, 8:11 PM

## 2024-05-04 NOTE — Group Note (Signed)
 Recreation Therapy Group Note   Group Topic:Stress Management  Group Date: 05/04/2024 Start Time: 1000 End Time: 1040 Facilitators: Celestia Jeoffrey BRAVO, LRT, CTRS Location: Dayroom  Group Description: PMR (Progressive Muscle Relaxation). LRT educates patients on what PMR is and the benefits that come from it. Patients are asked to sit with their feet flat on the floor while sitting up and all the way back in their chair, if possible. LRT and pts follow a prompt through a speaker that requires you to tense and release different muscles in their body and focus on their breathing. During session, lights are off and soft music is being played. Pts are given a stress ball to use if needed.   Goal Area(s) Addressed:  Patients will be able to describe progressive muscle relaxation.  Patient will practice using relaxation technique. Patient will identify a new coping skill.  Patient will follow multistep directions to reduce anxiety and stress.   Affect/Mood: N/A   Participation Level: Did not attend    Clinical Observations/Individualized Feedback: Patient did not attend group.   Plan: Continue to engage patient in RT group sessions 2-3x/week.   Jeoffrey BRAVO Celestia, LRT, CTRS 05/04/2024 11:37 AM

## 2024-05-04 NOTE — Progress Notes (Signed)
   05/04/24 1100  Psych Admission Type (Psych Patients Only)  Admission Status Voluntary  Psychosocial Assessment  Patient Complaints Anxiety (States his BID Ativan  manages his anxiety well.)  Eye Contact Fair  Facial Expression Flat  Affect Flat  Speech Soft;Slow  Interaction Assertive  Motor Activity Slow  Appearance/Hygiene Improved  Behavior Characteristics Cooperative  Mood Pleasant  Thought Process  Coherency WDL  Content WDL  Delusions None reported or observed  Perception WDL  Hallucination None reported or observed  Judgment Limited  Confusion None  Danger to Self  Current suicidal ideation? Denies  Self-Injurious Behavior No self-injurious ideation or behavior indicators observed or expressed   Agreement Not to Harm Self Yes  Description of Agreement Verbal  Danger to Others  Danger to Others None reported or observed

## 2024-05-04 NOTE — Group Note (Signed)
 Recreation Therapy Group Note   Group Topic:General Recreation  Group Date: 05/04/2024 Start Time: 1500 End Time: 1555 Facilitators: Celestia Jeoffrey BRAVO, LRT, CTRS Location: Courtyard  Group Description: [employer]. LRT and patients played games of basketball, drew with chalk, and played corn hole while outside in the courtyard while getting fresh air and sunlight. Music was being played in the background. LRT and peers conversed about different games they have played before, what they do in their free time and anything else that is on their minds. LRT encouraged pts to drink water after being outside, sweating and getting their heart rate up.  Goal Area(s) Addressed: Patient will build on frustration tolerance skills. Patients will partake in a competitive play game with peers. Patients will gain knowledge of new leisure interest/hobby.    Affect/Mood: Appropriate and Flat   Participation Level: Engaged   Participation Quality: Independent   Behavior: Calm   Speech/Thought Process: Coherent   Insight: Fair   Judgement: Fair    Modes of Intervention: Activity   Patient Response to Interventions:  Receptive   Education Outcome:  Acknowledges education   Clinical Observations/Individualized Feedback: Jaquis was active in their participation of session activities and group discussion. Pt interacted well with LRT and peers duration of session.    Plan: Continue to engage patient in RT group sessions 2-3x/week.   Jeoffrey BRAVO Celestia, LRT, CTRS 05/04/2024 4:53 PM

## 2024-05-04 NOTE — Progress Notes (Signed)
   05/04/24 2000  Psych Admission Type (Psych Patients Only)  Admission Status Voluntary  Psychosocial Assessment  Patient Complaints Anxiety  Eye Contact Fair  Facial Expression Flat  Affect Flat  Speech Soft  Interaction Assertive  Motor Activity Slow  Appearance/Hygiene Improved  Behavior Characteristics Cooperative  Mood Pleasant  Thought Process  Coherency WDL  Content WDL  Delusions None reported or observed  Perception WDL  Hallucination None reported or observed  Judgment Limited  Confusion None  Danger to Self  Current suicidal ideation? Denies  Self-Injurious Behavior No self-injurious ideation or behavior indicators observed or expressed   Agreement Not to Harm Self Yes  Description of Agreement verbal  Danger to Others  Danger to Others None reported or observed

## 2024-05-04 NOTE — Group Note (Signed)
 Date:  05/04/2024 Time:  8:38 PM  Group Topic/Focus:  Orientation:   The focus of this group is to educate the patient on the purpose and policies of crisis stabilization and provide a format to answer questions about their admission.  The group details unit policies and expectations of patients while admitted. Self Care:   The focus of this group is to help patients understand the importance of self-care in order to improve or restore emotional, physical, spiritual, interpersonal, and financial health.    Participation Level:  Did Not Attend  Participation Quality:  NONE  Affect:  NONE  Cognitive:  NONE  Insight: None  Engagement in Group:  NONE  Modes of Intervention:  NONE  Additional Comments:  NONE   [name] 05/04/2024, 8:38 PM

## 2024-05-04 NOTE — Plan of Care (Signed)
  Problem: Education: Goal: Emotional status will improve Outcome: Progressing   Problem: Education: Goal: Mental status will improve Outcome: Progressing   Problem: Activity: Goal: Sleeping patterns will improve Outcome: Progressing   Problem: Activity: Goal: Interest or engagement in activities will improve Outcome: Progressing   Problem: Health Behavior/Discharge Planning: Goal: Compliance with treatment plan for underlying cause of condition will improve Outcome: Progressing   Problem: Health Behavior/Discharge Planning: Goal: Identification of resources available to assist in meeting health care needs will improve Outcome: Not Progressing

## 2024-05-04 NOTE — Plan of Care (Signed)
   Problem: Education: Goal: Emotional status will improve Outcome: Progressing Goal: Mental status will improve Outcome: Progressing   Problem: Activity: Goal: Interest or engagement in activities will improve Outcome: Progressing

## 2024-05-05 NOTE — Group Note (Signed)
 Date:  05/05/2024 Time:  10:34 AM  Group Topic/Focus:  Dimensions of Wellness:   The focus of this group is to introduce the topic of wellness and discuss the role each dimension of wellness plays in total health.    Participation Level:  Did Not Attend   Lance [name] 05/05/2024, 10:34 AM

## 2024-05-05 NOTE — Group Note (Signed)
 Recreation Therapy Group Note   Group Topic:Other  Group Date: 05/05/2024 Start Time: 1530 End Time: 1620 Facilitators: Celestia Jeoffrey BRAVO, LRT, CTRS Location: Courtyard  Group Description: [employer]. LRT and patients played games of basketball, drew with chalk, and played corn hole while outside in the courtyard while getting fresh air and sunlight. Music was being played in the background. LRT and peers conversed about different games they have played before, what they do in their free time and anything else that is on their minds. LRT encouraged pts to drink water after being outside, sweating and getting their heart rate up.  Goal Area(s) Addressed: Patient will build on frustration tolerance skills. Patients will partake in a competitive play game with peers. Patients will gain knowledge of new leisure interest/hobby.    Affect/Mood: Appropriate   Participation Level: Active   Participation Quality: Independent   Behavior: Cooperative   Speech/Thought Process: Coherent   Insight: Fair   Judgement: Fair    Modes of Intervention: Activity   Patient Response to Interventions:  Receptive   Education Outcome:  In group clarification offered    Clinical Observations/Individualized Feedback: Lance [name] was active in their participation of session activities and group discussion. Pt interacted well with LRT and peers duration of session.    Plan: Continue to engage patient in RT group sessions 2-3x/week.   Jeoffrey BRAVO Celestia, LRT, CTRS 05/05/2024 5:13 PM

## 2024-05-05 NOTE — Group Note (Signed)
 Date:  05/05/2024 Time:  8:31 PM  Group Topic/Focus:  Wrap-Up Group:   The focus of this group is to help patients review their daily goal of treatment and discuss progress on daily workbooks.    Participation Level:  Active  Participation Quality:  none  Affect:  none  Cognitive:  none  Insight: None  Engagement in Group:  none  Modes of Intervention:  none  Additional Comments:  none   Kerri Katz 05/05/2024, 8:31 PM

## 2024-05-05 NOTE — Progress Notes (Signed)
 [facility] MD Progress Note  05/05/2024 10:30 AM Lance [name]  MRN:  [id number]   Subjective:  Chart reviewed, case discussed in multidisciplinary meeting, patient seen during rounds.   11/21 patient is found walking the hallways he is alert and pleasant.  He indicates he has been having some anxiety discussed utilizing his available PRNs.  He has been medication compliant and has not required behavioral PRNs.  He reports stable mood appetite and sleep.  He rates depression at 4 out of 10 and rates anxiety at 6 out of 10.  He continues to report some irritability.  He denies SI HI and AVH today.  Continues to report improved sleep and stable appetite.  Denies adverse effects of medication.  Continues consider anxiety primary concern.    11/20: On interview today, patient is noted to be calm and cooperative, alert and oriented. He reports ongoing anxiety, will add hydroxyzine  three times daily as needed for anxiety; discussed and approved by patient's guardian, Lance [name]. Patient rates depression as 7/10 today. He reports some irritability. He denies SI/HI/plan and denies hallucinations. He reports improved sleep and stable appetite. He denies sedation. He denies adverse effects to medication regimen. He considers his anxiety his primary concern.   11/19: On interview today, patient is calm and cooperative, alert and oriented.  He rates depression as 10 out of 10 and when asked about anxiety he states he is feeling a lot of anxiety.  He reports taking Ativan  1 mg twice daily at home every day for approximately the last 2 weeks.  Will schedule Ativan  twice daily.  PDMP reviewed.  Patient is counseled on risks of long-term use of benzodiazepines.  Patient reports passive SI without intent or plan.  He denies HI/plan and denies hallucinations.  He reports fair sleep and appetite.  He is tolerating medication regimen well without adverse effects.  He is future oriented and reports understanding of the  importance of medication compliance.  He does not voice any concerns or complaints at this time.  Past Psychiatric History: see h&P Family History:  Family History  Problem Relation Age of Onset   CAD Mother    Rheum arthritis Mother    Healthy Father    CAD Sister    Obesity Sister    Social History:  Social History   Substance and Sexual Activity  Alcohol Use Not Currently   Comment: occassionally     Social History   Substance and Sexual Activity  Drug Use Yes   Types: Marijuana   Comment: Patient reports occasionally    Social History   Socioeconomic History   Marital status: Single    Spouse name: Not on file   Number of children: Not on file   Years of education: Not on file   Highest education level: Not on file  Occupational History   Not on file  Tobacco Use   Smoking status: Every Day    Current packs/day: 1.00    Average packs/day: 1 pack/day for 23.0 years (23.0 ttl pk-yrs)    Types: Cigarettes   Smokeless tobacco: Never   Tobacco comments:    will provide material  Vaping Use   Vaping status: Every Day  Substance and Sexual Activity   Alcohol use: Not Currently    Comment: occassionally   Drug use: Yes    Types: Marijuana    Comment: Patient reports occasionally   Sexual activity: Not Currently    Birth control/protection: None    Comment: occasional marijuana-  none recently  Other Topics Concern   Not on file  Social History Narrative   From a group home in [place]   Social Drivers of Health   Financial Resource Strain: Low Risk  (03/20/2021)   Overall Financial Resource Strain (CARDIA)    Difficulty of Paying Living Expenses: Not very hard  Food Insecurity: No Food Insecurity (05/01/2024)   Hunger Vital Sign    Worried About Running Out of Food in the Last Year: Never true    Ran Out of Food in the Last Year: Never true  Transportation Needs: No Transportation Needs (05/01/2024)   PRAPARE - Scientist, Research (physical Sciences) (Medical): No    Lack of Transportation (Non-Medical): No  Physical Activity: Insufficiently Active (03/20/2021)   Exercise Vital Sign    Days of Exercise per Week: 3 days    Minutes of Exercise per Session: 20 min  Stress: No Stress Concern Present (03/20/2021)   [employer] of Occupational Health - Occupational Stress Questionnaire    Feeling of Stress : Only a little  Social Connections: Socially Isolated (03/20/2021)   Social Connection and Isolation Panel    Frequency of Communication with Friends and Family: More than three times a week    Frequency of Social Gatherings with Friends and Family: More than three times a week    Attends Religious Services: Never    Database Administrator or Organizations: No    Attends Engineer, Structural: Never    Marital Status: Never married   Past Medical History:  Past Medical History:  Diagnosis Date   Depression    Diabetes mellitus without complication (HCC)    Hyperlipidemia    Hypertension    Lupus anticoagulant disorder 06/14/2012   as per Dr.pandit's note in dec 2013.   PE (pulmonary thromboembolism) (HCC)    Schizo affective schizophrenia (HCC)    Supratherapeutic INR 11/19/2016   History reviewed. No pertinent surgical history.  Current Medications: Current Facility-Administered Medications  Medication Dose Route Frequency Provider Last Rate Last Admin   acetaminophen  (TYLENOL ) tablet 650 mg  650 mg Oral Q6H PRN [name], [name], NP       albuterol  (VENTOLIN  HFA) 108 (90 Base) MCG/ACT inhaler 2 puff  2 puff Inhalation Q6H PRN [name], [name], NP       alum & mag hydroxide-simeth (MAALOX/MYLANTA) 200-200-20 MG/5ML suspension 30 mL  30 mL Oral Q6H PRN [name], [name], NP       apixaban  (ELIQUIS ) tablet 2.5 mg  2.5 mg Oral BID [name], [name], NP   2.5 mg at 05/05/24 0847   cloZAPine  (CLOZARIL ) tablet 250 mg  250 mg Oral QHS [name], [name], NP   250 mg at 05/04/24 2104   haloperidol  (HALDOL ) tablet 5 mg  5 mg  Oral TID PRN [name], [name], NP       And   diphenhydrAMINE  (BENADRYL ) capsule 50 mg  50 mg Oral TID PRN [name], [name], NP       haloperidol  lactate (HALDOL ) injection 5 mg  5 mg Intramuscular TID PRN [name], [name], NP       And   diphenhydrAMINE  (BENADRYL ) injection 50 mg  50 mg Intramuscular TID PRN [name], [name], NP       And   LORazepam  (ATIVAN ) injection 2 mg  2 mg Intramuscular TID PRN [name], [name], NP       haloperidol  lactate (HALDOL ) injection 10 mg  10 mg Intramuscular  TID PRN [name], [name], NP       And   diphenhydrAMINE  (BENADRYL ) injection 50 mg  50 mg Intramuscular TID PRN [name], [name], NP       And   LORazepam  (ATIVAN ) injection 2 mg  2 mg Intramuscular TID PRN [name], [name], NP       fluticasone  furoate-vilanterol (BREO ELLIPTA ) 200-25 MCG/ACT 1 puff  1 puff Inhalation Daily [name], [name], NP   1 puff at 05/05/24 0850   hydrOXYzine  (ATARAX ) tablet 25 mg  25 mg Oral TID PRN [name], [name], PA-C   25 mg at 05/04/24 1447   lisinopril  (ZESTRIL ) tablet 2.5 mg  2.5 mg Oral Daily [name], [name], NP   2.5 mg at 05/05/24 0847   lithium  carbonate (ESKALITH ) ER tablet 450 mg  450 mg Oral QHS [name], [name], NP   450 mg at 05/04/24 2105   LORazepam  (ATIVAN ) tablet 1 mg  1 mg Oral BID [name], [name], PA-C   1 mg at 05/05/24 0847   melatonin tablet 5 mg  5 mg Oral QHS [name], [name], NP   5 mg at 05/04/24 2105   metFORMIN  (GLUCOPHAGE ) tablet 1,000 mg  1,000 mg Oral BID WC [name], [name], NP   1,000 mg at 05/05/24 0847   nicotine  (NICODERM CQ  - dosed in mg/24 hours) patch 14 mg  14 mg Transdermal Daily [name], [name], MD   14 mg at 05/03/24 9166   propranolol  (INDERAL ) tablet 20 mg  20 mg Oral QHS [name], [name], NP   20 mg at 05/04/24 2105   risperiDONE  (RISPERDAL ) tablet 1 mg  1 mg Oral BID [name], [name], NP   1 mg at 05/05/24 9150   simvastatin  (ZOCOR ) tablet 40 mg  40 mg Oral q1800 [name], [name], NP   40 mg at 05/04/24 2105   traZODone  (DESYREL ) tablet 100 mg  100 mg  Oral QHS [name], [name], NP   100 mg at 05/04/24 2105    Lab Results:  No results found for this or any previous visit (from the past 48 hours).   Blood Alcohol level:  Lab Results  Component Value Date   [facility] <15 04/30/2024   ETH <15 03/10/2024    Metabolic Disorder Labs: Lab Results  Component Value Date   HGBA1C 4.9 09/10/2023   MPG 93.93 09/10/2023   MPG 93.93 04/26/2022   Lab Results  Component Value Date   PROLACTIN 2.0 (L) 08/31/2016   PROLACTIN 4.8 05/22/2016   Lab Results  Component Value Date   CHOL 147 05/02/2024   TRIG 234 (H) 05/02/2024   HDL 32 (L) 05/02/2024   CHOLHDL 4.6 05/02/2024   VLDL 47 (H) 05/02/2024   LDLCALC 68 05/02/2024   LDLCALC 52 04/28/2022    Physical Findings: AIMS:  , ,  ,  ,    CIWA:    COWS:      Psychiatric Specialty Exam:  Presentation  General Appearance:  Disheveled  Eye Contact: Poor  Speech: Garbled  Speech Volume: Decreased    Mood and Affect  Mood: Depressed; Anxious  Affect: Congruent   Thought Process  Thought Processes: Linear  Orientation:Full (Time, Place and Person)  Thought Content:WDL  Hallucinations: None  Ideas of Reference: None Suicidal Thoughts: No  Homicidal Thoughts: No   Sensorium  Memory: Immediate Fair; Recent Fair; Remote Poor  Judgment: Fair  Insight: Fair   Chartered Certified Accountant: Fair  Attention Span: Fair  Recall: Dotti Abe of Knowledge: Fair  Language: Fair   Psychomotor Activity  Psychomotor Activity: No data recorded  Musculoskeletal: Strength & Muscle Tone: within normal limits Gait & Station: normal Assets  Assets: Manufacturing Systems Engineer; Desire for Improvement    Physical Exam: Physical Exam ROS Blood pressure 130/82, pulse 87, temperature 98.2 F (36.8 C), temperature source Oral, resp. rate 18, height 5' 10 (1.778 m), weight 90.7 kg, SpO2 99%. Body mass index is 28.7 kg/m.  Diagnosis: Principal  Problem:   GAD (generalized anxiety disorder)   PLAN: Safety and Monitoring:  -- Voluntary admission to inpatient psychiatric unit for safety, stabilization and treatment  -- Daily contact with patient to assess and evaluate symptoms and progress in treatment  -- Patient's case to be discussed in multi-disciplinary team meeting  -- Observation Level : q15 minute checks  -- Vital signs:  q12 hours  -- Precautions: suicide, elopement, and assault -- Encouraged patient to participate in unit milieu and in scheduled group therapies  2. Psychiatric Treatment:  Scheduled Medications:  Home psychiatric medications continued: Clozapine  250 mg at bedtime Lithium  ER 450 mg at bedtime Lorazepam  1 mg twice daily  Melatonin 5 mg at bedtime Propranolol  20 mg daily at bedtime Risperidone  1 mg twice daily Trazodone  100 mg daily at bedtime              Added hydroxyzine  25 mg TID as needed for anxiety    -- The risks/benefits/side-effects/alternatives to this medication were discussed in detail with the patient and time was given for questions. The patient consents to medication trial.  3. Medical Issues Being Addressed:  Home medications continued for medical comorbidities.   4. Discharge Planning:   -- Social work and case management to assist with discharge planning and identification of hospital follow-up needs prior to discharge  -- Estimated LOS: 5-7 days  Donnice FORBES Right, PA-C 05/05/2024, 10:30 AM

## 2024-05-05 NOTE — [facility] Group Notes (Signed)
 Spirituality Group   Group Goal: Support / Education around grief and loss   Group Description: Following introductions and group rules, group members engaged in facilitated group dialog and support around topic of loss, with particular support around experiences of loss in their lives. Group members identified types of loss (relationships / self / things) as well as patterns, circumstances, and changes that precipitate loss. Reflection invited on thoughts / feelings around loss, normalized grief responses, and recognized variety in grief experience. Group noted Worden's four tasks of grief in discussion. Group drew on Adlerian / Rogerian, narrative, MI, with Yalom's group therapy as a primary framework.   Observations: Lance [name] was present for first 0.5 of group and did not engage much in group discussion.  Lance [name] HERO.Div

## 2024-05-05 NOTE — Progress Notes (Signed)
   05/05/24 0850  Psych Admission Type (Psych Patients Only)  Admission Status Voluntary  Psychosocial Assessment  Patient Complaints Insomnia  Eye Contact Fair  Facial Expression Flat  Affect Flat  Speech Soft  Interaction Assertive  Motor Activity Slow  Appearance/Hygiene Unremarkable  Behavior Characteristics Cooperative;Appropriate to situation  Mood Pleasant  Thought Process  Coherency WDL  Content WDL  Delusions None reported or observed  Perception WDL  Hallucination None reported or observed  Judgment Poor  Confusion None  Danger to Self  Current suicidal ideation? Denies  Self-Injurious Behavior No self-injurious ideation or behavior indicators observed or expressed   Agreement Not to Harm Self Yes  Description of Agreement verbal  Danger to Others  Danger to Others None reported or observed

## 2024-05-05 NOTE — Plan of Care (Signed)
  Problem: Education: Goal: Mental status will improve Outcome: Progressing   

## 2024-05-05 NOTE — [facility] Counselor (Signed)
 CSW contacted guardian, [name] [phone]). CSW inquired if she had received the email. She shared that she had not. CSW agreed to send it again. CSW confirmed that pt outpatient provider is [facility] and he lives at [facility]. No other concerns expressed. Contact ended without incident.   CSW contacted [facility], Jevon Enoch ([phone]). He was informed of upcoming discharge. Helen inquired if there were any medication changes. He was informed that they were. Helen reported that his medication would need to be sent to [facility] in [place] [facility] [place]. He shared that pt has follow up appointment with [facility] on Tuesday, 05/09/24 at 10:20 AM. Helen shared that he would need to be called when pt is discharged on Sunday. No other concerns expressed. Contact ended without incident.   CSW attempted contact with guardian, [name]. Contact was unable to be established but voicemail left notifying of discharge.   Nadara SAUNDERS. Chaim, MSW, LCSW, [name] 05/05/2024 4:20 PM

## 2024-05-05 NOTE — Plan of Care (Signed)
   Problem: Education: Goal: Emotional status will improve Outcome: Progressing Goal: Mental status will improve Outcome: Progressing Goal: Verbalization of understanding the information provided will improve Outcome: Progressing

## 2024-05-05 NOTE — Progress Notes (Signed)
   05/04/24 1330  Spiritual Encounters  Type of Visit Initial  Care provided to: Patient  Referral source Chaplain assessment  Reason for visit Routine spiritual support  OnCall Visit No  Interventions  Spiritual Care Interventions Made Established relationship of care and support;Explored values/beliefs/practices/strengths   I offered spiritual care support to Lance [name]. We spoke in his room while he had opted to not attend Soc Work group but did seem interested to engage this chaplain to reflect on what he finds meaningful.  [name] explored values and beliefs. He named some theological views that relate to feeling punished. He described relationship challenges and issues of relational concern (eg, trust, coping with isolation).  I established a relationship of care and provided active and relfective listening. I invited some ways to reframe theological views that relate to punishment and instead invite reconcilittion with self and others, self-compassion. I offered and modeled relational support and invited sharing and exploring sources of values, meaning. Invited [name] to spirituality group at 1400.  [name] HERO.Div

## 2024-05-05 NOTE — Group Note (Signed)
 Recreation Therapy Group Note   Group Topic:Leisure Education  Group Date: 05/05/2024 Start Time: 1010 End Time: 1115 Facilitators: Celestia Jeoffrey BRAVO, LRT, CTRS Location: Craft Room  Group Description: Leisure. Patients were given the option to choose from journaling, coloring, drawing, making origami, playing with playdoh, listening to music or singing karaoke. LRT and pts discussed the meaning of leisure, the importance of participating in leisure during their free time/when they're outside of the hospital, as well as how our leisure interests can also serve as coping skills.   Goal Area(s) Addressed:  Patient will identify a current leisure interest.  Patient will learn the definition of "leisure". Patient will practice making a positive decision. Patient will have the opportunity to try a new leisure activity. Patient will communicate with peers and LRT.    Affect/Mood: N/A   Participation Level: Did not attend    Clinical Observations/Individualized Feedback: Patient did not attend group.   Plan: Continue to engage patient in RT group sessions 2-3x/week.   Jeoffrey BRAVO Celestia, LRT, CTRS 05/05/2024 11:34 AM

## 2024-05-06 LAB — LITHIUM LEVEL: Lithium Lvl: 0.54 mmol/L — ABNORMAL LOW (ref 0.60–1.20)

## 2024-05-06 NOTE — Progress Notes (Signed)
 [facility] MD Progress Note  05/06/2024 2:59 PM Lance [name]  MRN:  [id number]   Subjective:  Chart reviewed, case discussed in multidisciplinary meeting, patient seen during rounds.   11/22: Patient is found walking the hallways.  He is alert and pleasant.  He continues to note some anxiety but is optimistic about discharge tomorrow and getting back into his normal routine.  He reports depression 4 out of 10 and anxiety 5 out of 10.  He denies SI, HI, and AVH.  He reports stable sleep and appetite.  Social work has already reached out to the group home and confirm pharmacy for tomorrow.  Social work to reach out to guardian today and confirm discharge planning.  11/21 patient is found walking the hallways he is alert and pleasant.  He indicates he has been having some anxiety discussed utilizing his available PRNs.  He has been medication compliant and has not required behavioral PRNs.  He reports stable mood appetite and sleep.  He rates depression at 4 out of 10 and rates anxiety at 6 out of 10.  He continues to report some irritability.  He denies SI HI and AVH today.  Continues to report improved sleep and stable appetite.  Denies adverse effects of medication.  Continues consider anxiety primary concern.    11/20: On interview today, patient is noted to be calm and cooperative, alert and oriented. He reports ongoing anxiety, will add hydroxyzine  three times daily as needed for anxiety; discussed and approved by patient's guardian, [name]. Patient rates depression as 7/10 today. He reports some irritability. He denies SI/HI/plan and denies hallucinations. He reports improved sleep and stable appetite. He denies sedation. He denies adverse effects to medication regimen. He considers his anxiety his primary concern.   11/19: On interview today, patient is calm and cooperative, alert and oriented.  He rates depression as 10 out of 10 and when asked about anxiety he states he is feeling a lot of  anxiety.  He reports taking Ativan  1 mg twice daily at home every day for approximately the last 2 weeks.  Will schedule Ativan  twice daily.  PDMP reviewed.  Patient is counseled on risks of long-term use of benzodiazepines.  Patient reports passive SI without intent or plan.  He denies HI/plan and denies hallucinations.  He reports fair sleep and appetite.  He is tolerating medication regimen well without adverse effects.  He is future oriented and reports understanding of the importance of medication compliance.  He does not voice any concerns or complaints at this time.  Past Psychiatric History: see h&P Family History:  Family History  Problem Relation Age of Onset   CAD Mother    Rheum arthritis Mother    Healthy Father    CAD Sister    Obesity Sister    Social History:  Social History   Substance and Sexual Activity  Alcohol Use Not Currently   Comment: occassionally     Social History   Substance and Sexual Activity  Drug Use Yes   Types: Marijuana   Comment: Patient reports occasionally    Social History   Socioeconomic History   Marital status: Single    Spouse name: Not on file   Number of children: Not on file   Years of education: Not on file   Highest education level: Not on file  Occupational History   Not on file  Tobacco Use   Smoking status: Every Day    Current packs/day: 1.00  Average packs/day: 1 pack/day for 23.0 years (23.0 ttl pk-yrs)    Types: Cigarettes   Smokeless tobacco: Never   Tobacco comments:    will provide material  Vaping Use   Vaping status: Every Day  Substance and Sexual Activity   Alcohol use: Not Currently    Comment: occassionally   Drug use: Yes    Types: Marijuana    Comment: Patient reports occasionally   Sexual activity: Not Currently    Birth control/protection: None    Comment: occasional marijuana- none recently  Other Topics Concern   Not on file  Social History Narrative   From a group home in  [place]   Social Drivers of Health   Financial Resource Strain: Low Risk  (03/20/2021)   Overall Financial Resource Strain (CARDIA)    Difficulty of Paying Living Expenses: Not very hard  Food Insecurity: No Food Insecurity (05/01/2024)   Hunger Vital Sign    Worried About Running Out of Food in the Last Year: Never true    Ran Out of Food in the Last Year: Never true  Transportation Needs: No Transportation Needs (05/01/2024)   PRAPARE - Administrator, Civil Service (Medical): No    Lack of Transportation (Non-Medical): No  Physical Activity: Insufficiently Active (03/20/2021)   Exercise Vital Sign    Days of Exercise per Week: 3 days    Minutes of Exercise per Session: 20 min  Stress: No Stress Concern Present (03/20/2021)   [employer] of Occupational Health - Occupational Stress Questionnaire    Feeling of Stress : Only a little  Social Connections: Socially Isolated (03/20/2021)   Social Connection and Isolation Panel    Frequency of Communication with Friends and Family: More than three times a week    Frequency of Social Gatherings with Friends and Family: More than three times a week    Attends Religious Services: Never    Database Administrator or Organizations: No    Attends Engineer, Structural: Never    Marital Status: Never married   Past Medical History:  Past Medical History:  Diagnosis Date   Depression    Diabetes mellitus without complication (HCC)    Hyperlipidemia    Hypertension    Lupus anticoagulant disorder 06/14/2012   as per Dr.pandit's note in dec 2013.   PE (pulmonary thromboembolism) (HCC)    Schizo affective schizophrenia (HCC)    Supratherapeutic INR 11/19/2016   History reviewed. No pertinent surgical history.  Current Medications: Current Facility-Administered Medications  Medication Dose Route Frequency Provider Last Rate Last Admin   acetaminophen  (TYLENOL ) tablet 650 mg  650 mg Oral Q6H PRN [name], [name], NP        albuterol  (VENTOLIN  HFA) 108 (90 Base) MCG/ACT inhaler 2 puff  2 puff Inhalation Q6H PRN [name], [name], NP       alum & mag hydroxide-simeth (MAALOX/MYLANTA) 200-200-20 MG/5ML suspension 30 mL  30 mL Oral Q6H PRN [name], [name], NP       apixaban  (ELIQUIS ) tablet 2.5 mg  2.5 mg Oral BID [name], [name], NP   2.5 mg at 05/06/24 0821   cloZAPine  (CLOZARIL ) tablet 250 mg  250 mg Oral QHS [name], [name], NP   250 mg at 05/05/24 2059   haloperidol  (HALDOL ) tablet 5 mg  5 mg Oral TID PRN [name], [name], NP       And   diphenhydrAMINE  (BENADRYL ) capsule 50 mg  50 mg Oral TID PRN  Claudene Zelda NOVAK, NP       haloperidol  lactate (HALDOL ) injection 5 mg  5 mg Intramuscular TID PRN [name], [name], NP       And   diphenhydrAMINE  (BENADRYL ) injection 50 mg  50 mg Intramuscular TID PRN [name], [name], NP       And   LORazepam  (ATIVAN ) injection 2 mg  2 mg Intramuscular TID PRN [name], [name], NP       haloperidol  lactate (HALDOL ) injection 10 mg  10 mg Intramuscular TID PRN [name], [name], NP       And   diphenhydrAMINE  (BENADRYL ) injection 50 mg  50 mg Intramuscular TID PRN [name], [name], NP       And   LORazepam  (ATIVAN ) injection 2 mg  2 mg Intramuscular TID PRN [name], [name], NP       fluticasone  furoate-vilanterol (BREO ELLIPTA ) 200-25 MCG/ACT 1 puff  1 puff Inhalation Daily [name], [name], NP   1 puff at 05/05/24 0850   hydrOXYzine  (ATARAX ) tablet 25 mg  25 mg Oral TID PRN [name], [name], PA-C   25 mg at 05/04/24 1447   lisinopril  (ZESTRIL ) tablet 2.5 mg  2.5 mg Oral Daily [name], [name], NP   2.5 mg at 05/06/24 0820   lithium  carbonate (ESKALITH ) ER tablet 450 mg  450 mg Oral QHS [name], [name], NP   450 mg at 05/05/24 2100   LORazepam  (ATIVAN ) tablet 1 mg  1 mg Oral BID [name], [name], PA-C   1 mg at 05/06/24 0820   melatonin tablet 5 mg  5 mg Oral QHS [name], [name], NP   5 mg at 05/05/24 2100   metFORMIN  (GLUCOPHAGE ) tablet 1,000 mg  1,000 mg Oral BID WC [name], [name], NP   1,000 mg at  05/06/24 0820   nicotine  (NICODERM CQ  - dosed in mg/24 hours) patch 14 mg  14 mg Transdermal Daily [name], [name], MD   14 mg at 05/03/24 9166   propranolol  (INDERAL ) tablet 20 mg  20 mg Oral QHS [name], [name], NP   20 mg at 05/05/24 2059   risperiDONE  (RISPERDAL ) tablet 1 mg  1 mg Oral BID [name], [name], NP   1 mg at 05/06/24 0820   simvastatin  (ZOCOR ) tablet 40 mg  40 mg Oral q1800 [name], [name], NP   40 mg at 05/05/24 2104   traZODone  (DESYREL ) tablet 100 mg  100 mg Oral QHS [name], [name], NP   100 mg at 05/05/24 2100    Lab Results:  No results found for this or any previous visit (from the past 48 hours).   Blood Alcohol level:  Lab Results  Component Value Date   [facility] <15 04/30/2024   ETH <15 03/10/2024    Metabolic Disorder Labs: Lab Results  Component Value Date   HGBA1C 4.9 09/10/2023   MPG 93.93 09/10/2023   MPG 93.93 04/26/2022   Lab Results  Component Value Date   PROLACTIN 2.0 (L) 08/31/2016   PROLACTIN 4.8 05/22/2016   Lab Results  Component Value Date   CHOL 147 05/02/2024   TRIG 234 (H) 05/02/2024   HDL 32 (L) 05/02/2024   CHOLHDL 4.6 05/02/2024   VLDL 47 (H) 05/02/2024   LDLCALC 68 05/02/2024   LDLCALC 52 04/28/2022    Physical Findings: AIMS:  , ,  ,  ,    CIWA:    COWS:      Psychiatric Specialty Exam:  Presentation  General Appearance:  Disheveled  Eye Contact: Poor  Speech: Garbled  Speech Volume: Decreased    Mood and Affect  Mood: Depressed; Anxious  Affect: Congruent   Thought Process  Thought Processes: Linear  Orientation:Full (Time, Place and Person)  Thought Content:WDL  Hallucinations: None  Ideas of Reference: None Suicidal Thoughts: No  Homicidal Thoughts: No   Sensorium  Memory: Immediate Fair; Recent Fair; Remote Poor  Judgment: Fair  Insight: Fair   Chartered Certified Accountant: Fair  Attention Span: Fair  Recall: [employer] of  Knowledge: Fair  Language: Fair   Psychomotor Activity  Psychomotor Activity: No data recorded  Musculoskeletal: Strength & Muscle Tone: within normal limits Gait & Station: normal Assets  Assets: Manufacturing Systems Engineer; Desire for Improvement    Physical Exam: Physical Exam Vitals and nursing note reviewed.  HENT:     Head: Atraumatic.  Eyes:     Extraocular Movements: Extraocular movements intact.  Pulmonary:     Effort: Pulmonary effort is normal.  Neurological:     Mental Status: He is alert.  Psychiatric:        Behavior: Behavior normal.    Review of Systems  Psychiatric/Behavioral:  Positive for depression. Negative for hallucinations, substance abuse and suicidal ideas. The patient is nervous/anxious. The patient does not have insomnia.    Blood pressure 113/68, pulse 63, temperature 98.2 F (36.8 C), temperature source Oral, resp. rate 18, height 5' 10 (1.778 m), weight 90.7 kg, SpO2 99%. Body mass index is 28.7 kg/m.  Diagnosis: Principal Problem:   GAD (generalized anxiety disorder)   PLAN: Safety and Monitoring:  -- Voluntary admission to inpatient psychiatric unit for safety, stabilization and treatment  -- Daily contact with patient to assess and evaluate symptoms and progress in treatment  -- Patient's case to be discussed in multi-disciplinary team meeting  -- Observation Level : q15 minute checks  -- Vital signs:  q12 hours  -- Precautions: suicide, elopement, and assault -- Encouraged patient to participate in unit milieu and in scheduled group therapies  2. Psychiatric Treatment:  Scheduled Medications:  Home psychiatric medications continued: Clozapine  250 mg at bedtime Lithium  ER 450 mg at bedtime Lorazepam  1 mg twice daily  Melatonin 5 mg at bedtime Propranolol  20 mg daily at bedtime Risperidone  1 mg twice daily Trazodone  100 mg daily at bedtime            Lithium  level ordered for today recent CBC reviewed given clozapine   use Added hydroxyzine  25 mg TID as needed for anxiety    -- The risks/benefits/side-effects/alternatives to this medication were discussed in detail with the patient and time was given for questions. The patient consents to medication trial.  3. Medical Issues Being Addressed:  Home medications continued for medical comorbidities.   4. Discharge Planning:   -- Social work and case management to assist with discharge planning and identification of hospital follow-up needs prior to discharge  -- Estimated LOS: 5-7 days  Donnice FORBES Right, PA-C 05/06/2024, 2:59 PM

## 2024-05-06 NOTE — Progress Notes (Signed)
   05/05/24 2100  Psych Admission Type (Psych Patients Only)  Admission Status Voluntary  Psychosocial Assessment  Patient Complaints Anxiety  Eye Contact Brief  Facial Expression Flat  Affect Flat  Speech Soft  Interaction Assertive  Motor Activity Slow  Appearance/Hygiene Improved  Behavior Characteristics Cooperative;Appropriate to situation  Mood Pleasant  Thought Process  Coherency WDL  Content WDL  Delusions None reported or observed  Perception WDL  Hallucination None reported or observed  Judgment Poor  Confusion None  Danger to Self  Current suicidal ideation? Denies  Self-Injurious Behavior No self-injurious ideation or behavior indicators observed or expressed   Agreement Not to Harm Self Yes  Description of Agreement verbal  Danger to Others  Danger to Others None reported or observed   Patient alert and oriented x 4, affect is flat but brightens upon approach, 15 minutes safety checks maintained.

## 2024-05-06 NOTE — Group Note (Signed)
 Date:  05/06/2024 Time:  8:29 PM  Group Topic/Focus:  Making Healthy Choices:   The focus of this group is to help patients identify negative/unhealthy choices they were using prior to admission and identify positive/healthier coping strategies to replace them upon discharge. Self Care:   The focus of this group is to help patients understand the importance of self-care in order to improve or restore emotional, physical, spiritual, interpersonal, and financial health. Wrap-Up Group:   The focus of this group is to help patients review their daily goal of treatment and discuss progress on daily workbooks.    Participation Level:  Active  Participation Quality:  Appropriate and Attentive  Affect:  Appropriate  Cognitive:  Alert, Appropriate, and Oriented  Insight: Appropriate and Good  Engagement in Group:  Engaged  Modes of Intervention:  Discussion and Support  Additional Comments:  N/A  Lance [name] 05/06/2024, 8:29 PM

## 2024-05-06 NOTE — Plan of Care (Cosign Needed)

## 2024-05-06 NOTE — [facility] Suicide Risk Assessment (Signed)
 [facility] INPATIENT:  Family/Significant Other Suicide Prevention Education  Suicide Prevention Education:  Education Completed; Lance [name], legal guardian, [phone], Lance [name], [facility] [phone], has been identified by the patient as the family member/significant other with whom the patient will be residing, and identified as the person(s) who will aid the patient in the event of a mental health crisis (suicidal ideations/suicide attempt).  With written consent from the patient, the family member/significant other has been provided the following suicide prevention education, prior to the and/or following the discharge of the patient.  The suicide prevention education provided includes the following: Suicide risk factors Suicide prevention and interventions National Suicide Hotline telephone number [facility] assessment telephone number [facility] Emergency Assistance 911 [facility] and/or Residential Mobile Crisis Unit telephone number  Request made of family/significant other to: Remove weapons (e.g., guns, rifles, knives), all items previously/currently identified as safety concern.   Remove drugs/medications (over-the-counter, prescriptions, illicit drugs), all items previously/currently identified as a safety concern.  The family member/significant other verbalizes understanding of the suicide prevention education information provided.  The family member/significant other agrees to remove the items of safety concern listed above.  Lance [name] 05/06/2024, 4:57 PM

## 2024-05-06 NOTE — Progress Notes (Signed)
   05/06/24 2000  Psych Admission Type (Psych Patients Only)  Admission Status Voluntary  Psychosocial Assessment  Patient Complaints Anxiety  Eye Contact Fair  Facial Expression Flat  Affect Anxious  Speech Logical/coherent;Soft  Interaction Assertive  Motor Activity Slow;Pacing  Appearance/Hygiene Unremarkable  Behavior Characteristics Cooperative;Anxious  Mood Anxious;Pleasant  Thought Process  Coherency WDL  Content WDL  Delusions None reported or observed  Perception WDL  Hallucination None reported or observed  Judgment Limited  Confusion None  Danger to Self  Current suicidal ideation? Denies  Self-Injurious Behavior No self-injurious ideation or behavior indicators observed or expressed   Danger to Others  Danger to Others None reported or observed

## 2024-05-06 NOTE — Group Note (Signed)
 [facility] LCSW Group Therapy Note   Group Date: 05/06/2024 Start Time: 1330 End Time: 1430   Type of Therapy/Topic:  Group Therapy:  Emotion Regulation  Participation Level:  Minimal   Mood:   Description of Group:    The purpose of this group is to assist patients in learning to regulate negative emotions and experience positive emotions. Patients will be guided to discuss ways in which they have been vulnerable to their negative emotions. These vulnerabilities will be juxtaposed with experiences of positive emotions or situations, and patients challenged to use positive emotions to combat negative ones. Special emphasis will be placed on coping with negative emotions in conflict situations, and patients will process healthy conflict resolution skills.  Therapeutic Goals: Patient will identify two positive emotions or experiences to reflect on in order to balance out negative emotions:  Patient will label two or more emotions that they find the most difficult to experience:  Patient will be able to demonstrate positive conflict resolution skills through discussion or role plays:   Summary of Patient Progress:   Patient was present for group and participated minimally    Therapeutic Modalities:   Cognitive Behavioral Therapy Feelings Identification Dialectical Behavioral Therapy   Aldo CHRISTELLA Niece, LCSW

## 2024-05-06 NOTE — Plan of Care (Signed)
  Problem: Education: Goal: Emotional status will improve Outcome: Progressing Goal: Mental status will improve Outcome: Progressing   Problem: Activity: Goal: Interest or engagement in activities will improve Outcome: Progressing   Problem: Health Behavior/Discharge Planning: Goal: Compliance with treatment plan for underlying cause of condition will improve Outcome: Progressing   Problem: Physical Regulation: Goal: Ability to maintain clinical measurements within normal limits will improve Outcome: Progressing   Problem: Safety: Goal: Periods of time without injury will increase Outcome: Progressing

## 2024-05-07 MED ORDER — HYDROXYZINE HCL 25 MG PO TABS: 25.0000 mg | ORAL_TABLET | Freq: Every day | ORAL | 0 refills | Status: AC | PRN

## 2024-05-07 MED ORDER — LORAZEPAM 1 MG PO TABS: 1.0000 mg | ORAL_TABLET | Freq: Four times a day (QID) | ORAL | Status: DC | PRN

## 2024-05-07 NOTE — Progress Notes (Addendum)
  [facility] Adult Case Management Discharge Plan :  Will you be returning to the same living situation after discharge:  Yes,  Patient to return to his group home.  At discharge, do you have transportation home?: Yes,  Patient's group home to provide transportation.  Do you have the ability to pay for your medications: Yes,  MEDICARE / MEDICARE PART A AND B  Release of information consent forms completed and in the chart;  Patient's signature needed at discharge.  Patient to Follow up at:  Follow-up Information     [facility], [place]. Follow up.   Why: Your appointment is scheduled for Tuesday, 05/09/24 at 10:20 AM. Contact information: [address] [place], [place] [address] Phone: [phone]  Fax: [phone]        [facility] Follow up.   Why: [facility]- [place], [place]   Clinic Hours:  Monday - Friday: 8:00 a.m. - 5:00 p.m.   Walk-In / Same-Day Access: Walk-in assessments are available Monday, Wednesday, and Friday from 8:00 a.m. - 3:30 p.m.   You do not need an appointment during these times -- new patients may come in for same-day assessment and service connection.   After-Hours / Psychiatrist   If you experience a mental health or substance use crisis outside normal business hours, you may go to:   [facility] ([facility]  [address], [place], [place] [address]  Open 24 hours a day, 7 days a week for behavioral health emergencies.   If you feel unsafe, are having thoughts of self-harm, or are in danger, call 911 or go to the nearest emergency department.   You can also reach the 988 Suicide & Crisis Lifeline by calling or texting 988 any time. Contact information: [address] [place] [address] [phone]                 Next level of care provider has access to [facility] Link:no  Safety Planning and Suicide Prevention discussed: Yes,  [name], legal guardian,  [phone]     Has patient been referred to the Quitline?: Patient refused referral for treatment  Patient has been referred for addiction treatment: Yes, referral information given but appointment not made [facility] (list facility).  Alveta CHRISTELLA Kerns, LCSW 05/07/2024, 9:04 AM

## 2024-05-07 NOTE — Progress Notes (Signed)
   05/07/24 0920  Psych Admission Type (Psych Patients Only)  Admission Status Voluntary  Psychosocial Assessment  Patient Complaints Anxiety  Eye Contact Fair  Facial Expression Flat  Affect Anxious  Speech Soft;Logical/coherent  Interaction Assertive  Motor Activity Slow  Appearance/Hygiene Unremarkable  Behavior Characteristics Cooperative;Appropriate to situation  Mood Anxious;Pleasant  Aggressive Behavior  Effect No apparent injury  Thought Process  Coherency WDL  Content WDL  Delusions None reported or observed  Perception WDL  Hallucination None reported or observed  Judgment Limited  Confusion None  Danger to Self  Current suicidal ideation? Denies  Self-Injurious Behavior No self-injurious ideation or behavior indicators observed or expressed   Agreement Not to Harm Self Yes  Description of Agreement Verbal  Danger to Others  Danger to Others None reported or observed

## 2024-05-07 NOTE — Discharge Summary (Signed)
 Physician Discharge Summary Note  Patient:  Lance [name] is an 48 y.o., male MRN:  [id number] DOB:  03/13/1976 Patient phone:  [phone] (home)  Patient address:   [facility] [place] [address],   Total time spent: 40 min Date of Admission:  05/01/2024 Date of Discharge: 05/07/24  Reason for Admission:  acute anxiety, worsening depressive symptoms, and passive suicidal ideation in the context of schizophrenia vs schizoaffective disorder bipolar type  Principal Problem: GAD (generalized anxiety disorder) Discharge Diagnoses: Principal Problem:   GAD (generalized anxiety disorder)   Past Psychiatric History: see h&p  Family Psychiatric  History: see h&p Social History:  Social History   Substance and Sexual Activity  Alcohol Use Not Currently   Comment: occassionally     Social History   Substance and Sexual Activity  Drug Use Yes   Types: Marijuana   Comment: Patient reports occasionally    Social History   Socioeconomic History   Marital status: Single    Spouse name: Not on file   Number of children: Not on file   Years of education: Not on file   Highest education level: Not on file  Occupational History   Not on file  Tobacco Use   Smoking status: Every Day    Current packs/day: 1.00    Average packs/day: 1 pack/day for 23.0 years (23.0 ttl pk-yrs)    Types: Cigarettes   Smokeless tobacco: Never   Tobacco comments:    will provide material  Vaping Use   Vaping status: Every Day  Substance and Sexual Activity   Alcohol use: Not Currently    Comment: occassionally   Drug use: Yes    Types: Marijuana    Comment: Patient reports occasionally   Sexual activity: Not Currently    Birth control/protection: None    Comment: occasional marijuana- none recently  Other Topics Concern   Not on file  Social History Narrative   From a group home in [place]   Social Drivers of Health   Financial Resource Strain: Low Risk  (03/20/2021)    Overall Financial Resource Strain (CARDIA)    Difficulty of Paying Living Expenses: Not very hard  Food Insecurity: No Food Insecurity (05/01/2024)   Hunger Vital Sign    Worried About Running Out of Food in the Last Year: Never true    Ran Out of Food in the Last Year: Never true  Transportation Needs: No Transportation Needs (05/01/2024)   PRAPARE - Administrator, Civil Service (Medical): No    Lack of Transportation (Non-Medical): No  Physical Activity: Insufficiently Active (03/20/2021)   Exercise Vital Sign    Days of Exercise per Week: 3 days    Minutes of Exercise per Session: 20 min  Stress: No Stress Concern Present (03/20/2021)   [employer] of Occupational Health - Occupational Stress Questionnaire    Feeling of Stress : Only a little  Social Connections: Socially Isolated (03/20/2021)   Social Connection and Isolation Panel    Frequency of Communication with Friends and Family: More than three times a week    Frequency of Social Gatherings with Friends and Family: More than three times a week    Attends Religious Services: Never    Database Administrator or Organizations: No    Attends Banker Meetings: Never    Marital Status: Never married   Past Medical History:  Past Medical History:  Diagnosis Date   Depression    Diabetes mellitus without complication (HCC)  Hyperlipidemia    Hypertension    Lupus anticoagulant disorder 06/14/2012   as per Dr.pandit's note in dec 2013.   PE (pulmonary thromboembolism) (HCC)    Schizo affective schizophrenia (HCC)    Supratherapeutic INR 11/19/2016   History reviewed. No pertinent surgical history. Family History:  Family History  Problem Relation Age of Onset   CAD Mother    Rheum arthritis Mother    Healthy Father    CAD Sister    Obesity Sister     Hospital Course:    During this admission, the patient was hospitalized for safety, stabilization, and treatment of acute anxiety,  worsening depressive symptoms, and passive suicidal ideation in the context of schizophrenia vs schizoaffective disorder bipolar type. He initially presented after leaving his group home due to escalating anxiety and pervasive fears of "dying in his sleep." On admission he endorsed passive SI without intent or plan, significant anxiety, decreased energy and motivation, and recent intermittent AH/VH though he denied hallucinations at the time of evaluation. Guardian and group home director confirmed a pattern of intermittent decompensation related to fixation on anxious thoughts.  Throughout hospitalization, home psychiatric medications were continued including Clozapine , Lithium , Lorazepam , Risperidone , Trazodone , Propranolol , and Melatonin, and medication compliance was monitored. Ativan  was scheduled twice daily based on patient report of consistent outpatient use and PDMP confirmation. Hydroxyzine  25 mg TID PRN was added for breakthrough anxiety with guardian approval. A lithium  level and CBC were obtained and reviewed given clozapine  therapy. Patient was monitored at q15-minute intervals under suicide, elopement, and assault precautions. He demonstrated no agitation, aggression, or unsafe behavior during hospitalization. Nursing notes and multidisciplinary team assessments consistently described the patient as calm, cooperative, and future-oriented.  Over the course of his stay, the patient's anxiety gradually improved with structured environment, scheduled anxiolytics, restorative sleep, and supportive therapy. He consistently denied intent or plan for self-harm after admission, denied homicidal ideation, and denied hallucinations. Depression scores improved from 10/10 on 11/19 to 4/10 on 11/22, and anxiety decreased from "a lot" to 5/10 by the day prior to discharge. Sleep and appetite remained stable. He engaged appropriately with staff, attended to ADLs independently, and was able to verbalize coping  strategies and utilize PRN supports when needed. No medication side effects were reported.  Social Work coordinated with the guardian and group home throughout admission. Guardian and group home leadership expressed agreement with the discharge plan and confirmed ability to resume care. Pharmacy coordination and medication verification were completed. Collateral contacts confirmed the patient has an established outpatient psychiatric provider at [facility] and has upcoming follow-up arranged.  Day of Discharge:  On the day of discharge, patient denies SI, HI, and AVH. He is linear, calm, and cooperative on exam, with bright affect, stable mood, and improved anxiety. He reports ongoing but manageable anxiety and requests to continue his current medication regimen. He is optimistic about returning to his group home. I spoke with the group home lead, Jevon, who confirmed pickup at noon and reviewed discharge medications; he acknowledged that patient has been receiving Ativan  twice daily while here and verbalized understanding of the medication plan. Social Work spoke with the guardian and group home yesterday, both of whom were agreeable with the plan for discharge and voiced no safety concerns. No agitation, aggression, or self-harm behaviors were observed.  A detailed risk assessment was completed today based on clinical exam, chart review, and collateral information. Acute suicide risk is low, and acute violence risk is low. All modifiable risks of  harm to self or others have been addressed during this admission. The patient is at psychiatric baseline and no longer meets criteria for the acute inpatient setting. He is able to continue mental-health treatment safely in the community with appropriate supervision and supports in place.  The patient was educated on his discharge plan, medication regimen, follow-up expectations, and community crisis resources. He verbalized understanding and agreement with  the plan of care. He was instructed to call 911 or present to the nearest emergency room if he experiences any worsening symptoms, decompensation in mood, or return of suicidal or homicidal ideation. He acknowledges understanding of this safety guidance and agrees to the discharge plan. Pt was discharged to group home staff. Guardian was notified by SW.   Physical Findings: AIMS:  , ,  ,  ,    CIWA:    COWS:      Psychiatric Specialty Exam:  Presentation  General Appearance:  Casual  Eye Contact: Fair  Speech: Clear and Coherent  Speech Volume: Normal    Mood and Affect  Mood: Euthymic; Anxious  Affect: Congruent   Thought Process  Thought Processes: Coherent  Descriptions of Associations:Intact  Orientation:Full (Time, Place and Person)  Thought Content:WDL; Logical  Hallucinations:Hallucinations: None  Ideas of Reference:None  Suicidal Thoughts:Suicidal Thoughts: No  Homicidal Thoughts:Homicidal Thoughts: No   Sensorium  Memory: Immediate Fair; Recent Fair  Judgment: Poor (at baseline)  Insight: Poor (at baseline)   Art Therapist  Concentration: Fair  Attention Span: Fair  Recall: [employer] of Knowledge: Fair  Language: Fair   Psychomotor Activity  Psychomotor Activity: Psychomotor Activity: Normal  Musculoskeletal: Strength & Muscle Tone: within normal limits Gait & Station: normal Assets  Assets: Manufacturing Systems Engineer; Desire for Improvement   Sleep  Sleep: Sleep: Good    Physical Exam: Physical Exam Vitals and nursing note reviewed.  HENT:     Head: Atraumatic.  Eyes:     Extraocular Movements: Extraocular movements intact.  Pulmonary:     Effort: Pulmonary effort is normal.  Neurological:     Mental Status: He is alert and oriented to person, place, and time.  Psychiatric:        Mood and Affect: Mood normal.        Behavior: Behavior normal.    Review of Systems  Psychiatric/Behavioral:   Positive for depression. Negative for hallucinations, substance abuse and suicidal ideas. The patient is nervous/anxious. The patient does not have insomnia.    Blood pressure (!) 122/96, pulse 68, temperature 98.4 F (36.9 C), resp. rate 19, height 5' 10 (1.778 m), weight 90.7 kg, SpO2 98%. Body mass index is 28.7 kg/m.   Social History   Tobacco Use  Smoking Status Every Day   Current packs/day: 1.00   Average packs/day: 1 pack/day for 23.0 years (23.0 ttl pk-yrs)   Types: Cigarettes  Smokeless Tobacco Never  Tobacco Comments   will provide material   Tobacco Cessation:  A prescription for an FDA-approved tobacco cessation medication was offered at discharge and the patient refused   Blood Alcohol level:  Lab Results  Component Value Date   [facility] <15 04/30/2024   ETH <15 03/10/2024    Metabolic Disorder Labs:  Lab Results  Component Value Date   HGBA1C 4.9 09/10/2023   MPG 93.93 09/10/2023   MPG 93.93 04/26/2022   Lab Results  Component Value Date   PROLACTIN 2.0 (L) 08/31/2016   PROLACTIN 4.8 05/22/2016   Lab Results  Component Value Date  CHOL 147 05/02/2024   TRIG 234 (H) 05/02/2024   HDL 32 (L) 05/02/2024   CHOLHDL 4.6 05/02/2024   VLDL 47 (H) 05/02/2024   LDLCALC 68 05/02/2024   LDLCALC 52 04/28/2022    See Psychiatric Specialty Exam and Suicide Risk Assessment completed by Attending Physician prior to discharge.  Discharge destination:  Other:  Group home  Is patient on multiple antipsychotic therapies at discharge:  Yes,   Do you recommend tapering to monotherapy for antipsychotics?  No   Recommended Plan for Multiple Antipsychotic Therapies:Patient with complex psychiatric history and established out patient care he is likely to continue to require 2 antipsychotics, defer further management to outpatient provider.    Allergies as of 05/07/2024       Reactions   Penicillins Other (See Comments)   Reaction: lockjaw as teen Tolerated  Augmentin /Unasyn  06/09/22        Medication List     STOP taking these medications    clonazePAM  0.25 MG disintegrating tablet Commonly known as: KLONOPIN    NovoFine Autocover Pen Needle 30G X 8 MM Misc Generic drug: Insulin  Pen Needle   OLANZapine  20 MG tablet Commonly known as: ZYPREXA    ondansetron  4 MG disintegrating tablet Commonly known as: ZOFRAN -ODT   sertraline  100 MG tablet Commonly known as: ZOLOFT        TAKE these medications      Indication  albuterol  108 (90 Base) MCG/ACT inhaler Commonly known as: VENTOLIN  HFA Inhale 2 puffs into the lungs every 6 (six) hours as needed for wheezing or shortness of breath.  Indication: Asthma   clozapine  50 MG tablet Commonly known as: CLOZARIL  Take 5 tablets (250 mg total) by mouth at bedtime.  Indication: Schizophrenia that does Not Respond to Usual Drug Therapy   Eliquis  2.5 MG Tabs tablet Generic drug: apixaban  Take 2.5 mg by mouth 2 (two) times daily.  Indication: Treatment to Prevent Bloot Clot in Lung   fluticasone -salmeterol 250-50 MCG/ACT Aepb Commonly known as: ADVAIR Inhale 1 puff into the lungs 2 (two) times daily.  Indication: Asthma   hydrOXYzine  25 MG tablet Commonly known as: ATARAX  Take 1 tablet (25 mg total) by mouth daily as needed for anxiety.  Indication: Feeling Anxious   lisinopril  2.5 MG tablet Commonly known as: ZESTRIL  Take 1 tablet (2.5 mg total) by mouth daily.  Indication: High Blood Pressure   lithium  carbonate 450 MG ER tablet Commonly known as: ESKALITH  Take 1 tablet (450 mg total) by mouth at bedtime.  Indication: Schizoaffective Disorder   LORazepam  1 MG tablet Commonly known as: ATIVAN  Take 1 mg by mouth every 6 (six) hours as needed (anxiety).  Indication: Feeling Anxious   melatonin 5 MG Tabs Take 5 mg by mouth at bedtime.  Indication: Trouble Sleeping   metFORMIN  1000 MG tablet Commonly known as: GLUCOPHAGE  Take 1 tablet (1,000 mg total) by mouth 2 (two)  times daily with a meal.  Indication: Type 2 Diabetes   propranolol  20 MG tablet Commonly known as: INDERAL  Take 1 tablet (20 mg total) by mouth at bedtime.  Indication: Feeling Anxious   risperiDONE  1 MG tablet Commonly known as: RISPERDAL  Take 1 mg by mouth 2 (two) times daily.  Indication: schizoaffective disorder   simvastatin  40 MG tablet Commonly known as: ZOCOR  Take 1 tablet (40 mg total) by mouth daily at 6 PM.  Indication: High Cholesterol without a Genetic Cause   traZODone  100 MG tablet Commonly known as: DESYREL  Take 1 tablet (100 mg total) by mouth at  bedtime.  Indication: Trouble Sleeping        Follow-up Information     [facility], [place]. Follow up.   Why: Your appointment is scheduled for Tuesday, 05/09/24 at 10:20 AM. Contact information: [address] [place], [place] [address] Phone: [phone]  Fax: [phone]        [facility] Follow up.   Why: [facility]- [place], [place]   Clinic Hours:  Monday - Friday: 8:00 a.m. - 5:00 p.m.   Walk-In / Same-Day Access: Walk-in assessments are available Monday, Wednesday, and Friday from 8:00 a.m. - 3:30 p.m.   You do not need an appointment during these times -- new patients may come in for same-day assessment and service connection.   After-Hours / Psychiatrist   If you experience a mental health or substance use crisis outside normal business hours, you may go to:   [facility] ([facility]  [address], [place], [place] [address]  Open 24 hours a day, 7 days a week for behavioral health emergencies.   If you feel unsafe, are having thoughts of self-harm, or are in danger, call 911 or go to the nearest emergency department.   You can also reach the 988 Suicide & Crisis Lifeline by calling or texting 988 any time. Contact information: [address] [place] [address] [phone]                 Follow-up  recommendations:   # It is recommended to the patient to continue psychiatric medications as prescribed, after discharge from the hospital.   # It is recommended to the patient to follow up with your outpatient psychiatric provider and PCP. # It was discussed with the patient, the impact of alcohol, drugs, tobacco have been there overall psychiatric and medical wellbeing, and total abstinence from substance use was recommended. # Home medications were continued, group home indicated they had on hand.  Patient agreeable to plan. Given the opportunity to ask questions. Appears to feel comfortable with discharge.  # In the event of worsening symptoms, the patient is instructed to call the crisis hotline (988), 911 and or go to the nearest ED for appropriate evaluation and treatment of symptoms. To follow-up with primary care provider for other medical issues, concerns and or health care needs # Patient was discharged to group home as requested with a plan to follow up as noted above.      Signed: Donnice FORBES Right, PA-C 05/07/2024, 10:26 AM

## 2024-05-07 NOTE — BHH Suicide Risk Assessment (Signed)
 [facility] Discharge Suicide Risk Assessment   Principal Problem: GAD (generalized anxiety disorder) Discharge Diagnoses: Principal Problem:   GAD (generalized anxiety disorder)   Total Time spent with patient: 1 hour  Musculoskeletal: Strength & Muscle Tone: within normal limits Gait & Station: normal Patient leans: N/A  Psychiatric Specialty Exam  Presentation  General Appearance:  Casual  Eye Contact: Fair  Speech: Clear and Coherent  Speech Volume: Normal  Handedness:No data recorded  Mood and Affect  Mood: Euthymic; Anxious  Duration of Depression Symptoms: No data recorded Affect: Congruent   Thought Process  Thought Processes: Coherent  Descriptions of Associations:Intact  Orientation:Full (Time, Place and Person)  Thought Content:WDL; Logical  History of Schizophrenia/Schizoaffective disorder:No data recorded Duration of Psychotic Symptoms:Greater than six months  Hallucinations:Hallucinations: None  Ideas of Reference:None  Suicidal Thoughts:Suicidal Thoughts: No  Homicidal Thoughts:Homicidal Thoughts: No   Sensorium  Memory: Immediate Fair; Recent Fair  Judgment: Poor (at baseline)  Insight: Poor (at baseline)   Executive Functions  Concentration: Fair  Attention Span: Fair  Recall: [employer] of Knowledge: Fair  Language: Fair   Psychomotor Activity  Psychomotor Activity: Psychomotor Activity: Normal   Assets  Assets: Manufacturing Systems Engineer; Desire for Improvement   Sleep  Sleep: Sleep: Good  Estimated Sleeping Duration (Last 24 Hours): 6.25-8.75 hours (Due to Daylight Saving Time, the durations displayed may not accurately represent documentation during the time change interval)  Physical Exam: Physical Exam Vitals and nursing note reviewed.  HENT:     Head: Atraumatic.  Eyes:     Extraocular Movements: Extraocular movements intact.  Pulmonary:     Effort: Pulmonary effort is normal.  Neurological:      Mental Status: He is alert and oriented to person, place, and time.    Review of Systems  Psychiatric/Behavioral:  Negative for depression, hallucinations, memory loss, substance abuse and suicidal ideas. The patient is nervous/anxious. The patient does not have insomnia.    Blood pressure (!) 122/96, pulse 68, temperature 98.4 F (36.9 C), resp. rate 19, height 5' 10 (1.778 m), weight 90.7 kg, SpO2 98%. Body mass index is 28.7 kg/m.  Mental Status Per Nursing Assessment::   On Admission:  NA  Demographic Factors:  Male and Low socioeconomic status  Loss Factors: NA  Historical Factors: Prior suicide attempts and Impulsivity  Risk Reduction Factors:   Living with another person, especially a relative, Positive social support, and living in group home  Continued Clinical Symptoms:  Previous Psychiatric Diagnoses and Treatments Medical Diagnoses and Treatments/Surgeries  Cognitive Features That Contribute To Risk:  Loss of executive function    Suicide Risk:  Minimal: No identifiable suicidal ideation.  Patients presenting with no risk factors but with morbid ruminations; may be classified as minimal risk based on the severity of the depressive symptoms   Follow-up Information     [facility], [facility]. Follow up.   Why: Your appointment is scheduled for Tuesday, 05/09/24 at 10:20 AM. Contact information: [address] [place], [place] [address] Phone: [phone]  Fax: [phone]        [facility] Follow up.   Why: [facility]- [place], [place]   Clinic Hours:  Monday - Friday: 8:00 a.m. - 5:00 p.m.   Walk-In / Same-Day Access: Walk-in assessments are available Monday, Wednesday, and Friday from 8:00 a.m. - 3:30 p.m.   You do not need an appointment during these times -- new patients may come in for same-day assessment and service connection.  After-Hours / Psychiatrist   If you experience a mental health or  substance use crisis outside normal business hours, you may go to:   [facility] ([facility]  [address] [place], [place] [address]  Open 24 hours a day, 7 days a week for behavioral health emergencies.   If you feel unsafe, are having thoughts of self-harm, or are in danger, call 911 or go to the nearest emergency department.   You can also reach the 988 Suicide & Crisis Lifeline by calling or texting 988 any time. Contact information: [address] [place] [address] [phone]                 Plan Of Care/Follow-up recommendations:  # It is recommended to the patient to continue psychiatric medications as prescribed, after discharge from the hospital.   # It is recommended to the patient to follow up with your outpatient psychiatric provider and PCP. # It was discussed with the patient, the impact of alcohol, drugs, tobacco have been there overall psychiatric and medical wellbeing, and total abstinence from substance use was recommended. # Prescriptions provided or sent directly to preferred pharmacy at discharge. Patient agreeable to plan. Given the opportunity to ask questions. Appears to feel comfortable with discharge.  # In the event of worsening symptoms, the patient is instructed to call the crisis hotline (988), 911 and or go to the nearest ED for appropriate evaluation and treatment of symptoms. To follow-up with primary care provider for other medical issues, concerns and or health care needs # Patient was discharged home as requested with a plan to follow up as noted above.    Donnice FORBES Right, PA-C 05/07/2024, 10:17 AM

## 2024-05-07 NOTE — Plan of Care (Signed)
  Problem: Education: Goal: Knowledge of [facility] Education information/materials will improve 05/07/2024 1044 by Shirley Jon FALCON, RN Outcome: Completed/Met 05/07/2024 1043 by Shirley Jon FALCON, RN Outcome: Adequate for Discharge Goal: Emotional status will improve 05/07/2024 1044 by Shirley Jon FALCON, RN Outcome: Completed/Met 05/07/2024 1043 by Shirley Jon FALCON, RN Outcome: Adequate for Discharge Goal: Mental status will improve 05/07/2024 1044 by Shirley Jon FALCON, RN Outcome: Completed/Met 05/07/2024 1043 by Shirley Jon FALCON, RN Outcome: Adequate for Discharge Goal: Verbalization of understanding the information provided will improve 05/07/2024 1044 by Shirley Jon FALCON, RN Outcome: Completed/Met 05/07/2024 1043 by Shirley Jon FALCON, RN Outcome: Adequate for Discharge   Problem: Activity: Goal: Interest or engagement in activities will improve 05/07/2024 1044 by Shirley Jon FALCON, RN Outcome: Completed/Met 05/07/2024 1043 by Shirley Jon FALCON, RN Outcome: Adequate for Discharge Goal: Sleeping patterns will improve 05/07/2024 1044 by Shirley Jon FALCON, RN Outcome: Completed/Met 05/07/2024 1043 by Shirley Jon FALCON, RN Outcome: Adequate for Discharge   Problem: Coping: Goal: Ability to verbalize frustrations and anger appropriately will improve 05/07/2024 1044 by Shirley Jon FALCON, RN Outcome: Completed/Met 05/07/2024 1043 by Shirley Jon FALCON, RN Outcome: Adequate for Discharge Goal: Ability to demonstrate self-control will improve 05/07/2024 1044 by Shirley Jon FALCON, RN Outcome: Completed/Met 05/07/2024 1043 by Shirley Jon FALCON, RN Outcome: Adequate for Discharge   Problem: Health Behavior/Discharge Planning: Goal: Identification of resources available to assist in meeting health care needs will improve 05/07/2024 1044 by Shirley Jon FALCON, RN Outcome: Completed/Met 05/07/2024 1043 by Shirley Jon FALCON, RN Outcome: Adequate for Discharge Goal:  Compliance with treatment plan for underlying cause of condition will improve 05/07/2024 1044 by Shirley Jon FALCON, RN Outcome: Completed/Met 05/07/2024 1043 by Shirley Jon FALCON, RN Outcome: Adequate for Discharge   Problem: Physical Regulation: Goal: Ability to maintain clinical measurements within normal limits will improve 05/07/2024 1044 by Shirley Jon FALCON, RN Outcome: Completed/Met 05/07/2024 1043 by Shirley Jon FALCON, RN Outcome: Adequate for Discharge   Problem: Safety: Goal: Periods of time without injury will increase 05/07/2024 1044 by Shirley Jon FALCON, RN Outcome: Completed/Met 05/07/2024 1043 by Shirley Jon FALCON, RN Outcome: Adequate for Discharge

## 2024-05-07 NOTE — Plan of Care (Signed)
   Problem: Education: Goal: Knowledge of [facility] Education information/materials will improve Outcome: Adequate for Discharge Goal: Emotional status will improve Outcome: Adequate for Discharge Goal: Mental status will improve Outcome: Adequate for Discharge Goal: Verbalization of understanding the information provided will improve Outcome: Adequate for Discharge   Problem: Activity: Goal: Interest or engagement in activities will improve Outcome: Adequate for Discharge Goal: Sleeping patterns will improve Outcome: Adequate for Discharge   Problem: Coping: Goal: Ability to verbalize frustrations and anger appropriately will improve Outcome: Adequate for Discharge Goal: Ability to demonstrate self-control will improve Outcome: Adequate for Discharge   Problem: Health Behavior/Discharge Planning: Goal: Identification of resources available to assist in meeting health care needs will improve Outcome: Adequate for Discharge Goal: Compliance with treatment plan for underlying cause of condition will improve Outcome: Adequate for Discharge   Problem: Physical Regulation: Goal: Ability to maintain clinical measurements within normal limits will improve Outcome: Adequate for Discharge   Problem: Safety: Goal: Periods of time without injury will increase Outcome: Adequate for Discharge

## 2024-05-07 NOTE — Care Management Important Message (Signed)
 Important Message  Patient Details  Name: [name] MRN: [id number] Date of Birth: 05/09/1976   Medicare Important Message Given:  Yes - Medicare IM     Eddi Hymes M Herberth Deharo, LCSW 05/07/2024, 10:22 AM

## 2024-05-07 NOTE — Progress Notes (Signed)
 Patient denies SI/I/AVH at this time. Discharge instructions, AVS, prescriptions, and transition record reviewed with patient. Patient agrees to comply with medication management, follow-up visit and outpatient therapy. Patient belongings returned to patient. Patient questions and concerns addressed and answered.  Patient ambulatory off unit. Patient discharged via Taxi.

## 2024-05-19 ENCOUNTER — Emergency Department
Admission: EM | Admit: 2024-05-19 | Discharge: 2024-05-19 | Disposition: A | Discharge status: Home / Self Care | Attending: Emergency Medicine | Admitting: Emergency Medicine

## 2024-05-19 ENCOUNTER — Other Ambulatory Visit: Payer: Self-pay

## 2024-05-19 DIAGNOSIS — F410 Panic disorder [episodic paroxysmal anxiety] without agoraphobia: Secondary | ICD-10-CM | POA: Diagnosis present

## 2024-05-19 DIAGNOSIS — R45851 Suicidal ideations: Secondary | ICD-10-CM | POA: Insufficient documentation

## 2024-05-19 DIAGNOSIS — F1920 Other psychoactive substance dependence, uncomplicated: Secondary | ICD-10-CM | POA: Insufficient documentation

## 2024-05-19 DIAGNOSIS — I10 Essential (primary) hypertension: Secondary | ICD-10-CM | POA: Insufficient documentation

## 2024-05-19 DIAGNOSIS — E119 Type 2 diabetes mellitus without complications: Secondary | ICD-10-CM | POA: Insufficient documentation

## 2024-05-19 DIAGNOSIS — Z8659 Personal history of other mental and behavioral disorders: Secondary | ICD-10-CM | POA: Insufficient documentation

## 2024-05-19 DIAGNOSIS — F17210 Nicotine dependence, cigarettes, uncomplicated: Secondary | ICD-10-CM | POA: Insufficient documentation

## 2024-05-19 LAB — CBC
HCT: 40.8 % (ref 39.0–52.0)
Hemoglobin: 13.9 g/dL (ref 13.0–17.0)
MCH: 31.3 pg (ref 26.0–34.0)
MCHC: 34.1 g/dL (ref 30.0–36.0)
MCV: 91.9 fL (ref 80.0–100.0)
Platelets: 219 K/uL (ref 150–400)
RBC: 4.44 MIL/uL (ref 4.22–5.81)
RDW: 12 % (ref 11.5–15.5)
WBC: 5.3 K/uL (ref 4.0–10.5)
nRBC: 0 % (ref 0.0–0.2)

## 2024-05-19 LAB — COMPREHENSIVE METABOLIC PANEL WITH GFR
ALT: 17 U/L (ref 0–44)
AST: 17 U/L (ref 15–41)
Albumin: 4.8 g/dL (ref 3.5–5.0)
Alkaline Phosphatase: 63 U/L (ref 38–126)
Anion gap: 13 (ref 5–15)
BUN: 18 mg/dL (ref 6–20)
CO2: 26 mmol/L (ref 22–32)
Calcium: 9.9 mg/dL (ref 8.9–10.3)
Chloride: 102 mmol/L (ref 98–111)
Creatinine, Ser: 1.09 mg/dL (ref 0.61–1.24)
GFR, Estimated: >60 mL/min (ref >60)
Glucose, Bld: 181 mg/dL — ABNORMAL HIGH (ref 70–99)
Potassium: 4.2 mmol/L (ref 3.5–5.1)
Sodium: 141 mmol/L (ref 135–145)
Total Bilirubin: 0.3 mg/dL (ref 0.0–1.2)
Total Protein: 7.1 g/dL (ref 6.5–8.1)

## 2024-05-19 LAB — ETHANOL: Alcohol, Ethyl (B): <15 mg/dL (ref <15)

## 2024-05-19 MED ORDER — CLONAZEPAM 2 MG PO TABS: 2.0000 mg | ORAL_TABLET | Freq: Two times a day (BID) | ORAL | 0 refills | Status: DC

## 2024-05-19 NOTE — ED Triage Notes (Signed)
 Pt comes in with [facility] from [facility] with complaints of anxiety, vomiting and panic attacks at night for several days. Pt endorses suicidal ideations, and reports hallucinations, and is coming to hospital to get help. Pt states that his medications were recently changed.
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
## 2024-05-19 NOTE — ED Notes (Signed)
 Patient requested something for sleep from the nsg tech, however upon nurse returning to the room patient is asleep and snoring.

## 2024-05-19 NOTE — ED Provider Notes (Signed)
 [facility] Provider Note   Event Date/Time   First MD Initiated Contact with Patient 05/19/24 1843     (approximate) History  Suicidal  HPI Lewi Drost is a 48 y.o. male with a past medical history of type 2 diabetes, hypertension, hyperlipidemia, depression, and schizoaffective disorder who presents via [facility] department from [facility] complaining of anxiety and panic attacks for the last few days.  Patient also endorses suicidal ideation without a plan.  Patient states that he asked department to bring him to the emergency department for further evaluation.  Patient also states that his medications were recently changed and he does not feel that these are controlling his symptoms.  Patient denies any HI or AVH ROS: Patient currently denies any vision changes, tinnitus, difficulty speaking, facial droop, sore throat, chest pain, shortness of breath, abdominal pain, nausea/vomiting/diarrhea, dysuria, or weakness/numbness/paresthesias in any extremity   Physical Exam  Triage Vital Signs: ED Triage Vitals  Encounter Vitals Group     BP 05/19/24 1826 (!) 134/91     Girls Systolic BP Percentile --      Girls Diastolic BP Percentile --      Boys Systolic BP Percentile --      Boys Diastolic BP Percentile --      Pulse Rate 05/19/24 1826 87     Resp 05/19/24 1826 18     Temp --      Temp src --      SpO2 05/19/24 1826 99 %     Weight 05/19/24 1827 184 lb 15.5 oz (83.9 kg)     Height 05/19/24 1827 5' 5 (1.651 m)     Head Circumference --      Peak Flow --      Pain Score 05/19/24 1826 5     Pain Loc --      Pain Education --      Exclude from Growth Chart --    Most recent vital signs: Vitals:   05/19/24 1826 05/19/24 1900  BP: (!) 134/91   Pulse: 87   Resp: 18   Temp:  98.1 F (36.7 C)  SpO2: 99%    General: Awake, oriented x4. CV:  Good peripheral perfusion. Resp:  Normal effort. Abd:  No distention. Other:  Disheveled middle-aged obese  Caucasian male resting comfortably in no acute distress ED Results / Procedures / Treatments  Labs (all labs ordered are listed, but only abnormal results are displayed) Labs Reviewed  COMPREHENSIVE METABOLIC PANEL WITH GFR - Abnormal; Notable for the following components:      Result Value   Glucose, Bld 181 (*)    All other components within normal limits  ETHANOL  CBC  URINE DRUG SCREEN  PROCEDURES: Critical Care performed: No Procedures MEDICATIONS ORDERED IN ED: Medications - No data to display IMPRESSION / MDM / ASSESSMENT AND PLAN / ED COURSE  I reviewed the triage vital signs and the nursing notes.                             The patient is on the cardiac monitor to evaluate for evidence of arrhythmia and/or significant heart rate changes. Patient's presentation is most consistent with acute presentation with potential threat to life or bodily function. Patient is a 48 year old male with the above-stated past medical history presents complaining of suicidal ideation without a plan Thoughts are linear and organized, and patient has no AH, VH, or HI.  Prior Psychiatric Hospitalizations: Multiple  Clinically patient displays no overt toxidrome; they are well appearing, with low suspicion for toxic ingestion given history and exam. Thoughts unlikely 2/2 anemia, hypothyroidism, infection, or ICH.  Consult: Psychiatry to evaluate patient for potential hold for danger to self.  Disposition Discharge. Patient evaluated by both emergency and psychiatric professionals and deemed safe at this time for discharge. They have a plan for psychiatric follow up and a safe place to stay with access to physical and emotional support.   FINAL CLINICAL IMPRESSION(S) / ED DIAGNOSES   Final diagnoses:  None   Rx / DC Orders   ED Discharge Orders     None      Note:  This document was prepared using Dragon voice recognition software and may include unintentional dictation errors.    Eleftherios Dudenhoeffer K, MD 05/19/24 2112

## 2024-05-19 NOTE — ED Notes (Addendum)
 Called [facility] at [phone] and a voice mail of Jevon stated that the mail box was full and not able to leave a message at this time. Contacted [place] [employer] police dept to do a well check on the residence.

## 2024-05-19 NOTE — ED Notes (Signed)
 Pt dressed out by this RN and EDT Noelle.  Pt belongings: Pair of shoes Pair of pants 3 Shirts 1 hat 1 coat Pair of socks 1 belt 1 pair of underwear

## 2024-05-19 NOTE — ED Notes (Signed)
 Contacted [facility] spoke with Francis, patient was d/c from [facility] to home which is listed at  [facility] -in [place], [place]. [facility] has Mercer listed as guardian as well however due to leaving a previous voice mail and it stating that she will not return the phone call until the next business day, called police dept to do a well check on the address provided to try to get the patient home safely. [facility] also has a contact of a person named Therisa [phone]. Did not call Therisa at this time since unsure the relationship of Therisa to the patient.

## 2024-05-19 NOTE — ED Notes (Addendum)
 [facility] Hollie present at bedside to take patient home.

## 2024-05-19 NOTE — ED Notes (Signed)
 Attempted to contact guardian to determine what group home patient lives at, patient is discharged and need a plan for discharging home including transportation.

## 2024-05-19 NOTE — Consult Note (Signed)
 [facility] Consult Note  Patient Name: Lance [name] MRN: [id number] DOB: [birth year] DATE OF Consult: 05/19/2024   TELEPSYCHIATRY ATTESTATION & CONSENT  As the provider for this telehealth consult, I attest that I verified the patient's identity using two separate identifiers, introduced myself to the patient, provided my credentials, disclosed my location, and performed this encounter via a HIPAA-compliant, real-time, face-to-face, two-way, interactive audio and video platform and with the full consent and agreement of the patient (or guardian as applicable.)  Patient physical location: [facility] . [facility]   Bed: ED21AA  Acuity:  Emergent   Telehealth provider physical location: home office in state of [place]  Video scheduled start time: 0715 pm  (Central Time) Video end time: 0800 (Central Time)   PRIMARY PSYCHIATRIC DIAGNOSES (ICD-10 format preferred)  Anxiety disorder, unspecified/ panic attacks reported Schizophrenia by hx; but his symptoms of primarily visual hall ( shapes) no looseness no thought disorder is noted; questionable  dx in my eyes  SI is passive ---chronic on and off. Just released from INPT 2 weeks ago Panic attacks is also chronic on and off for years Chronic dependence on benzo consider taking him off short acting ativan  and use klonopin  2 bid instead.   Later taper off slowly will defer such to group home psychiatrist Hx of jail, arrest, probation  RECOMMENDATIONS      Medication recommendations: as above Dc ativan  ; klonopin  2 mg po bid. Can give one dose now if need be  Non-Medication/therapeutic recommendations: no sitter needed  There are no psychiatric contraindications to discharge at this time       Plan Post Discharge/Psychiatric Care Follow-up resources he can see his group home psychiatrist for follow up  Follow-Up Telepsychiatry C/L services: We will sign off for now. Please re-consult our  service if needed for any concerning changes in the patient's condition, discharge planning, or questions.  Communication:  Treatment team members (and family members if applicable) who  were involved in treatment/care discussions and planning, and with whom we spoke  or engaged with via secure text/chat, include the following: talked with RN.   Thank you for involving us  in the care of this patient. If you have any additional  questions or concerns, please call [phone] and ask for me or the provider on-call   CHIEF COMPLAINT/REASON FOR CONSULT  SI once  again   HISTORY OF PRESENT ILLNESS (HPI)  The patient 48 yo was here in inpatient psych hospital for  Hx of  history of schizophrenia vs schizoaffective disorder bipolar type, hypertension, hyperlipidemia, type 2 diabetes mellitus  who presents to the ED after leaving his group home and calling the police to bring him to the ED due to anxiety and thoughts of death.  Admitted 05/24/2024 and discharged on  05-06-24  Now 2 weeks later back in ED for SI with hallucinations Anxiety vomiting panic attacks ===============  Claims more anxiety and insomnia and panic attacks for past one week Unclear why  Panic attacks appear chronic in nature and looking at EMR for years on and off Patient having passive SI also recurrent in his past He states yes to primarily visual hallucination seeing shape and figure not consistent with schizophrenia No gross paranoia No severe thoughts disorder no looseness of association  No auditory hall which is more common with schizophrenia He has a hx of jail time and probation in the past No current HI No agitation  No aggression  He  actually appears fairly comfortable here in the ED I see hx of substance use cannabis and years and years of benzo dependence He has yet to give urine Little no distress is noted during my interview Resting comfortably once again with no agitation or restlessness  No  behavioral concerns and dyscontrol  that would obligate an involuntary hold  Consider DC from the ED He has psychiatrist to follow up with  Stop ATIVAN  and instead use long acting Klonopin  2 mg po bid  Easier to taper in the long run Cannot r/o  possible sociopathy Sitter in my eyes is not needed      PAST PSYCHIATRIC HISTORY  One distant SA age 65 yo overdose  Otherwise as per HPI above.  PAST MEDICAL HISTORY  Past Medical History:  Diagnosis Date   Depression    Diabetes mellitus without complication (HCC)    Hyperlipidemia    Hypertension    Lupus anticoagulant disorder 06/14/2012   as per Dr.pandit's note in dec 2013.   PE (pulmonary thromboembolism) (HCC)    Schizo affective schizophrenia (HCC)    Supratherapeutic INR 11/19/2016      HOME MEDICATIONS  (Not in a hospital admission)       ALLERGIES  Allergies  Allergen Reactions   Penicillins Other (See Comments)    Reaction: lockjaw as teen Tolerated Augmentin /Unasyn  06/09/22      SOCIAL & SUBSTANCE USE HISTORY  Social History   Socioeconomic History   Marital status: Single    Spouse name: Not on file   Number of children: Not on file   Years of education: Not on file   Highest education level: Not on file  Occupational History   Not on file  Tobacco Use   Smoking status: Every Day    Current packs/day: 1.00    Average packs/day: 1 pack/day for 23.0 years (23.0 ttl pk-yrs)    Types: Cigarettes   Smokeless tobacco: Never   Tobacco comments:    will provide material  Vaping Use   Vaping status: Every Day  Substance and Sexual Activity   Alcohol use: Not Currently    Comment: occassionally   Drug use: Yes    Types: Marijuana    Comment: Patient reports occasionally   Sexual activity: Not Currently    Birth control/protection: None    Comment: occasional marijuana- none recently  Other Topics Concern   Not on file  Social History Narrative   From a group home in [place]   Social  Drivers of Health   Financial Resource Strain: Low Risk  (03/20/2021)   Overall Financial Resource Strain (CARDIA)    Difficulty of Paying Living Expenses: Not very hard  Food Insecurity: No Food Insecurity (05/01/2024)   Hunger Vital Sign    Worried About Running Out of Food in the Last Year: Never true    Ran Out of Food in the Last Year: Never true  Transportation Needs: No Transportation Needs (05/01/2024)   PRAPARE - Administrator, Civil Service (Medical): No    Lack of Transportation (Non-Medical): No  Physical Activity: Insufficiently Active (03/20/2021)   Exercise Vital Sign    Days of Exercise per Week: 3 days    Minutes of Exercise per Session: 20 min  Stress: No Stress Concern Present (03/20/2021)   [employer] of Occupational Health - Occupational Stress Questionnaire    Feeling of Stress : Only a little  Social Connections: Socially Isolated (03/20/2021)   Social Connection and Isolation Panel  Frequency of Communication with Friends and Family: More than three times a week    Frequency of Social Gatherings with Friends and Family: More than three times a week    Attends Religious Services: Never    Database Administrator or Organizations: No    Attends Engineer, Structural: Never    Marital Status: Never married    Benzo dependence and cannabis use in the past   FAMILY HISTORY  Family History  Problem Relation Age of Onset   CAD Mother    Rheum arthritis Mother    Healthy Father    CAD Sister    Obesity Sister    Family Psychiatric History (if known):          MENTAL STATUS EXAM (MSE)  Mental Status Exam: General Appearance: Casual  Orientation:  Full (Time, Place, and Person)  Memory:  intact   Concentration:  Concentration: Good  Recall:  Good  Attention  Good  Eye Contact:  Good  Speech:  Clear and Coherent  Language:  Good  Volume:  Normal  Mood: euthymic  Affect:  Congruent  Thought Process:  Coherent  Thought  Content:  Hallucinations: Visual  Suicidal Thoughts:  yes passive no real intention of death  Homicidal Thoughts:  No  Judgement:  Fair  Insight:  Fair  Psychomotor Activity:  Normal  Akathisia:  No  Fund of Knowledge:  Fair    Assets:  Communication Skills  Cognition:  WNL  ADL's:  Intact  AIMS (if indicated):          VITALS (IF TAKEN)   @VSR @     LABS that are pertinent     ROS & ADDITIONAL FINDINGS  ROS: Notable for the following relevant positive findings: Psychiatric: anxiety panic attacks Other notable positive ROS findings: = visual hall   Additional findings:      Musculoskeletal:    [x]  No Abnormal Movements Observed        []  Impaired      Gait & Station:        [x]  Normal        []  Wheelchair/Walker          []  Laying/Sitting       Pain Screening:   [x]  Denies    []  Present--mild to moderate     []  Present--severe (will                             consider referral for ongoing evaluation and treatment)      Nutrition & Dental Concerns:no gross dental or  eating disorder   RISK ASSESSMENT*  Is the patient experiencing any suicidal or homicidal ideations:     [x] YES        []  NO       Explain if yes: per hpi passive Protective factors considered for safety management: back to board and care  Risk factors/concerns considered for safety management: (check all that apply) []  Prior attempt                                      []  Hopelessness       []  Family history of suicide                    []  Impulsivity []  Depression                                         []   Aggression [x]  Substance  dependence          []  Isolation []  Physical illness/chronic pain              []  Barriers to accessing treatment []  Recent loss                                        []  Unwillingness to seek help []  Access to lethal means                      [x]  Male gender []  Age over 70                                        [x]  Unmarried  Is there a safety management plan with the  patient and treatment team to minimize risk factors and promote protective factors:     [x]  YES      []  NO            Explain: routine nursing staff obs        Based on my current evaluation and risk assessment of the patient at the time of this encounter, this patient is considered to be at:   [x]    Low Risk                      [x]   Moderate Risk                     []   High Risk  *RISK ASSESSMENT Risk assessment is a dynamic process; it is possible that this patient's condition, and risk level, may change. This should be re-evaluated and managed over time as appropriate. Please re-consult psychiatric consult services if additional assistance is needed in terms of risk assessment and management. If your team decides to discharge this patient, please advise the patient how to best access emergency psychiatric services, or to call 911, if their condition worsens or they feel unsafe in any way.   CW Lonni Ivanoff, M. D., [facility]

## 2024-05-20 ENCOUNTER — Encounter: Payer: Self-pay | Admitting: Intensive Care

## 2024-05-20 ENCOUNTER — Emergency Department (EMERGENCY_DEPARTMENT_HOSPITAL)
Admission: EM | Admit: 2024-05-20 | Discharge: 2024-05-21 | Disposition: A | Discharge status: Other Acute Inpatient Hospital | Source: Home / Self Care | Attending: Emergency Medicine | Admitting: Emergency Medicine

## 2024-05-20 ENCOUNTER — Other Ambulatory Visit: Payer: Self-pay

## 2024-05-20 DIAGNOSIS — F209 Schizophrenia, unspecified: Secondary | ICD-10-CM | POA: Insufficient documentation

## 2024-05-20 DIAGNOSIS — E119 Type 2 diabetes mellitus without complications: Secondary | ICD-10-CM | POA: Insufficient documentation

## 2024-05-20 DIAGNOSIS — F3130 Bipolar disorder, current episode depressed, mild or moderate severity, unspecified: Secondary | ICD-10-CM | POA: Insufficient documentation

## 2024-05-20 DIAGNOSIS — R45851 Suicidal ideations: Secondary | ICD-10-CM | POA: Insufficient documentation

## 2024-05-20 DIAGNOSIS — F203 Undifferentiated schizophrenia: Secondary | ICD-10-CM | POA: Diagnosis present

## 2024-05-20 DIAGNOSIS — I10 Essential (primary) hypertension: Secondary | ICD-10-CM | POA: Insufficient documentation

## 2024-05-20 LAB — CBC
HCT: 41.4 % (ref 39.0–52.0)
Hemoglobin: 14.2 g/dL (ref 13.0–17.0)
MCH: 31.8 pg (ref 26.0–34.0)
MCHC: 34.3 g/dL (ref 30.0–36.0)
MCV: 92.8 fL (ref 80.0–100.0)
Platelets: 222 K/uL (ref 150–400)
RBC: 4.46 MIL/uL (ref 4.22–5.81)
RDW: 12 % (ref 11.5–15.5)
WBC: 6.1 K/uL (ref 4.0–10.5)
nRBC: 0 % (ref 0.0–0.2)

## 2024-05-20 LAB — CBC WITH DIFFERENTIAL/PLATELET
Abs Immature Granulocytes: 0.02 K/uL (ref 0.00–0.07)
Basophils Absolute: 0 K/uL (ref 0.0–0.1)
Basophils Relative: 0 %
Eosinophils Absolute: 0 K/uL (ref 0.0–0.5)
Eosinophils Relative: 0 %
HCT: 42.1 % (ref 39.0–52.0)
Hemoglobin: 14.2 g/dL (ref 13.0–17.0)
Immature Granulocytes: 0 %
Lymphocytes Relative: 24 %
Lymphs Abs: 1.4 K/uL (ref 0.7–4.0)
MCH: 31.6 pg (ref 26.0–34.0)
MCHC: 33.7 g/dL (ref 30.0–36.0)
MCV: 93.6 fL (ref 80.0–100.0)
Monocytes Absolute: 0.9 K/uL (ref 0.1–1.0)
Monocytes Relative: 15 %
Neutro Abs: 3.6 K/uL (ref 1.7–7.7)
Neutrophils Relative %: 61 %
Platelets: 237 K/uL (ref 150–400)
RBC: 4.5 MIL/uL (ref 4.22–5.81)
RDW: 12.1 % (ref 11.5–15.5)
WBC: 5.9 K/uL (ref 4.0–10.5)
nRBC: 0 % (ref 0.0–0.2)

## 2024-05-20 LAB — COMPREHENSIVE METABOLIC PANEL WITH GFR
ALT: 18 U/L (ref 0–44)
AST: 20 U/L (ref 15–41)
Albumin: 4.8 g/dL (ref 3.5–5.0)
Alkaline Phosphatase: 68 U/L (ref 38–126)
Anion gap: 13 (ref 5–15)
BUN: 18 mg/dL (ref 6–20)
CO2: 27 mmol/L (ref 22–32)
Calcium: 10.3 mg/dL (ref 8.9–10.3)
Chloride: 102 mmol/L (ref 98–111)
Creatinine, Ser: 1.45 mg/dL — ABNORMAL HIGH (ref 0.61–1.24)
GFR, Estimated: 59 mL/min — ABNORMAL LOW (ref >60)
Glucose, Bld: 145 mg/dL — ABNORMAL HIGH (ref 70–99)
Potassium: 4.3 mmol/L (ref 3.5–5.1)
Sodium: 142 mmol/L (ref 135–145)
Total Bilirubin: 0.3 mg/dL (ref 0.0–1.2)
Total Protein: 7.1 g/dL (ref 6.5–8.1)

## 2024-05-20 LAB — LITHIUM LEVEL: Lithium Lvl: 0.42 mmol/L — ABNORMAL LOW (ref 0.60–1.20)

## 2024-05-20 LAB — ETHANOL: Alcohol, Ethyl (B): <15 mg/dL (ref <15)

## 2024-05-20 MED ORDER — CLONAZEPAM 1 MG PO TABS
2.0000 mg | ORAL_TABLET | Freq: Two times a day (BID) | ORAL | Status: DC
  Administered 2024-05-21: 2 mg via ORAL
  Filled 2024-05-20: qty 2

## 2024-05-20 MED ORDER — HALOPERIDOL LACTATE 5 MG/ML IJ SOLN: 5.0000 mg | INTRAMUSCULAR | Status: AC

## 2024-05-20 MED ORDER — RISPERIDONE 1 MG PO TABS
1.0000 mg | ORAL_TABLET | Freq: Two times a day (BID) | ORAL | Status: DC
  Administered 2024-05-21 (×2): 1 mg via ORAL
  Filled 2024-05-20 (×2): qty 1

## 2024-05-20 MED ORDER — METFORMIN HCL 500 MG PO TABS
1000.0000 mg | ORAL_TABLET | Freq: Two times a day (BID) | ORAL | Status: DC
  Administered 2024-05-21: 1000 mg via ORAL
  Filled 2024-05-20: qty 2

## 2024-05-20 MED ORDER — CLOZAPINE 100 MG PO TABS
250.0000 mg | ORAL_TABLET | Freq: Every day | ORAL | Status: DC
  Administered 2024-05-21: 250 mg via ORAL
  Filled 2024-05-20: qty 3

## 2024-05-20 MED ORDER — LISINOPRIL 5 MG PO TABS
2.5000 mg | ORAL_TABLET | Freq: Every day | ORAL | Status: DC
  Administered 2024-05-21 (×2): 2.5 mg via ORAL
  Filled 2024-05-20 (×2): qty 1

## 2024-05-20 MED ORDER — IBUPROFEN 600 MG PO TABS
600.0000 mg | ORAL_TABLET | Freq: Three times a day (TID) | ORAL | Status: DC | PRN
  Administered 2024-05-20: 600 mg via ORAL
  Filled 2024-05-20: qty 1

## 2024-05-20 MED ORDER — MELATONIN 5 MG PO TABS
5.0000 mg | ORAL_TABLET | Freq: Every day | ORAL | Status: DC
  Administered 2024-05-21: 5 mg via ORAL
  Filled 2024-05-20: qty 1

## 2024-05-20 MED ORDER — TRAZODONE HCL 100 MG PO TABS
100.0000 mg | ORAL_TABLET | Freq: Every day | ORAL | Status: DC
  Administered 2024-05-21: 100 mg via ORAL
  Filled 2024-05-20: qty 1

## 2024-05-20 MED ORDER — PROPRANOLOL HCL 10 MG PO TABS
20.0000 mg | ORAL_TABLET | Freq: Every day | ORAL | Status: DC
  Administered 2024-05-21: 20 mg via ORAL
  Filled 2024-05-20: qty 2

## 2024-05-20 MED ORDER — SIMVASTATIN 40 MG PO TABS
40.0000 mg | ORAL_TABLET | Freq: Every day | ORAL | Status: DC
  Filled 2024-05-20: qty 1

## 2024-05-20 MED ORDER — HALOPERIDOL 5 MG PO TABS
5.0000 mg | ORAL_TABLET | ORAL | Status: AC
  Administered 2024-05-20: 5 mg via ORAL
  Filled 2024-05-20: qty 1

## 2024-05-20 MED ORDER — ALUM & MAG HYDROXIDE-SIMETH 200-200-20 MG/5ML PO SUSP: 30.0000 mL | Freq: Four times a day (QID) | ORAL | Status: DC | PRN

## 2024-05-20 MED ORDER — LITHIUM CARBONATE ER 450 MG PO TBCR
450.0000 mg | EXTENDED_RELEASE_TABLET | Freq: Every day | ORAL | Status: DC
  Administered 2024-05-21: 450 mg via ORAL
  Filled 2024-05-20 (×2): qty 1

## 2024-05-20 MED ORDER — ONDANSETRON HCL 4 MG PO TABS: 4.0000 mg | ORAL_TABLET | Freq: Three times a day (TID) | ORAL | Status: DC | PRN

## 2024-05-20 MED ORDER — HYDROXYZINE HCL 25 MG PO TABS: 25.0000 mg | ORAL_TABLET | Freq: Every day | ORAL | Status: DC | PRN

## 2024-05-20 MED ORDER — APIXABAN 2.5 MG PO TABS
2.5000 mg | ORAL_TABLET | Freq: Two times a day (BID) | ORAL | Status: DC
  Administered 2024-05-21 (×2): 2.5 mg via ORAL
  Filled 2024-05-20 (×2): qty 1

## 2024-05-20 MED ORDER — CLONAZEPAM 0.5 MG PO TABS
2.0000 mg | ORAL_TABLET | ORAL | Status: AC
  Administered 2024-05-20: 2 mg via ORAL
  Filled 2024-05-20: qty 4

## 2024-05-20 NOTE — ED Notes (Signed)
 Observe overnight and reassess in a.m

## 2024-05-20 NOTE — Consult Note (Signed)
 [facility] Psychiatric Consult Initial  Patient Name: .Lance [name]  MRN: [id number]  DOB: [birth year]  Consult Order details:  Orders (From admission, onward)     Start     Ordered   05/20/24 1522  CONSULT TO CALL ACT TEAM       Ordering Provider: Viviann Pastor, MD  Provider:  (Not yet assigned)  Question:  Reason for Consult?  Answer:  Psych consult   05/20/24 1521   05/20/24 1522  IP CONSULT TO PSYCHIATRY       Ordering Provider: Viviann Pastor, MD  Provider:  (Not yet assigned)  Question Answer Comment  Consult Timeframe URGENT - requires response within 12 hours   URGENT timeframe requires provider to provider communication, has the provider to provider communication been completed Yes   Reason for Consult? 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 male admitted: Presented to the EDfor 05/20/2024  3:15 PM for Patient arrived with IVC papers. Patient was transported to the crisis center by law enforcement after being found lying in front of tobacco store. Upon contact, he reported hearing voices. SABRA He carries the psychiatric diagnoses of Schizophrenia and has a past medical history of  Chart review significant for diabetes mellitus, hx of pulmonary embolus.   On current presentation, patient reported I am having a bad time.  Current assessment limited due to patient getting frustrated and stating I just cannot think right now.  Patient reporting ongoing negative thoughts that he is going to die and believes that it is going to happen soon.  Patient  reported that these thoughts just started today.  Patient was recently seen in the emergency room yesterday for similar.  Today on exam, patient denied suicidal or homicidal thoughts.  Patient denied auditory hallucinations, he described what was going along currently as negative thoughts.  Patient reported visual hallucinations today and reported he can see many faces.  Patient did not appear to be responding to internal stimuli on current exam.  Patient reported he believes his medications are causing increased anxiety.  Patient was unsure if any medication changes had been made recently.  Patient was unsure of current outpatient psychiatric provider. Unsure of current medication compliance.  Patient got frustrated and irritated with continued questioning by psychiatry team. It is noted in chart that patient has legal guardian listed as [name].  Psychiatry team attempted to contact Vivian, HIPAA compliant voicemail left at this time requesting return call.  Patient reported that he walked away from group home last night and had been walking around in the streets all night and laid in front of the tobacco store due to feeling mentally and physically unwell. Given the patient's recent admission to the inpatient unit and history of frequent emergency room visits, as well as current concerning behaviors--including walking away from the group home and lying in front of a tobacco store--it is recommended that the patient be observed overnight in the emergency room. This will allow for continued monitoring of the  patient's behavior and assessment of any potential underlying medical or psychological concerns that may require intervention.    Diagnoses:  Active Hospital problems: Active Problems:   Suicidal thoughts   Schizophrenia, 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HCC)    Plan   ## Psychiatric Medication Recommendations:  Recommend home medications be ordered after medication reconciliation performed  ##  Medical Decision Making Capacity: Patient has a guardian and has thus been adjudicated incompetent; please involve patients guardian in medical decision making  ## Further Work-up:   -- most recent EKG on 05/01/2024 had QtC of 480 -- Pertinent labwork reviewed earlier this admission includes: CBC, CMP, ethanol, pending urine drug screen, this provider added on lithium  level as well as differential to CBC due to reported Clozaril  therapy   ## Disposition:--Observe overnight and reassess in the morning-still need to get into contact with the patient's legal guardian  ## Behavioral / Environmental: -Utilize compassion and acknowledge the patient's experiences while setting clear and realistic expectations for care.    ## Safety and Observation Level:  - Based on my clinical evaluation, I estimate the patient to be at low risk of self harm in the current setting.  Patient currently denying suicidal thoughts at this time.  Unit can continue with protocol every 15 minute rounding. - At this time, we recommend  routine. This decision is based on my review of the chart including patient's history and current presentation, interview of the patient, mental status examination, and consideration of suicide risk including evaluating suicidal ideation, plan, intent, suicidal or self-harm behaviors, risk factors, and protective factors. This judgment is based on our ability to directly address suicide risk, implement suicide prevention strategies, and develop a safety plan while the patient is in the clinical setting. Please contact our team if there is a concern that risk level has changed.  CSSR Risk Category:C-SSRS RISK CATEGORY: Moderate Risk  Suicide Risk Assessment: Patient has following modifiable risk factors for suicide: medication noncompliance and current symptoms: anxiety/panic, insomnia, impulsivity, anhedonia, hopelessness, which we are addressing by recommending to observe overnight and reassess  in the morning. Patient has following non-modifiable or demographic risk factors for suicide: male gender and psychiatric hospitalization Patient has the following protective factors against suicide: Access to outpatient mental health care  Thank you for this consult request. Recommendations have been communicated to the primary team.  We will reassess in the morning at this time.   Zelda Sharps, NP        History of Present Illness  Relevant Aspects of Hospital ED   Patient Report:  On current presentation, patient reported I am having a bad time.  Current assessment limited due to patient getting frustrated and stating I just cannot think right now.  Patient reporting ongoing negative thoughts that he is going to die and believes that it is going to happen soon.  Patient reported that these thoughts just started today.  Patient was recently seen in the emergency room yesterday for similar.  Today on exam, patient denied suicidal or homicidal thoughts.  Patient denied auditory hallucinations, he described what was going on currently as negative thoughts.  Patient reported visual hallucinations today and reported he can see many faces.  Patient did not appear to be responding to internal stimuli on current exam.  Patient reported he believes his medications are causing increased anxiety.  Patient was unsure if any medication changes had been made recently.  Patient reported similar complaints yesterday.  Patient was unsure of current outpatient psychiatric provider.  Unsure of current medication compliance.  Patient got frustrated and irritated with continued questioning by psychiatry team and refused to engage further.  It is noted in chart that patient has legal guardian listed as [name].  Psychiatry team attempted to contact Vivian, HIPAA compliant voicemail left at this time requesting return call.  Patient reported that he walked away from group home last night and had been walking  around in the streets all night and laid in front of the tobacco store due to feeling mentally and physically unwell.  Patient was discharged from this facility last night at 2312.  Given the patient's recent admission to the inpatient unit and history of frequent emergency room visits, as well as current concerning behaviors--including walking away from the group home and lying in front of a tobacco store (waiting to obtain collateral information for verification)--it is recommended that the patient be observed overnight in the emergency room. This will allow for continued monitoring of the patient's behavior and assessment of any potential underlying medical or psychological concerns that may require intervention.  Psych ROS:  Depression: Endorsed Anxiety: Endorsed Mania (lifetime and current): Denied Psychosis: (lifetime and current): Schizophrenia documented in patient chart  Collateral information:  Attempted to contact legal guardian whose number is listed on file.  HIPAA compliant voice message left requesting return call    Psychiatric and Social History  Psychiatric History:  Information collected from patient and chart review  Prev Dx/Sx: Schizoaffective disorder documented in patient chart Current Psych Provider: Patient was unsure, but is established with outpatient providers Home Meds (current): Patient unsure, did report currently on Clozaril  Previous Med Trials: Unsure Therapy: Unsure  Prior Psych Hospitalization: Yes Prior Self Harm: One distant SA age 54 yo overdose  Prior Violence: Previous incarceration for attempted murder  Family Psych History: Unsure Family Hx suicide: Unsure  Social History:    Occupational Hx: Unemployed Legal Hx: See above Living Situation: Group home Access to weapons/lethal means: Denied  Substance History Alcohol: Unable to assess due to patient getting frustrated and unwilling to further engage in assessment Tobacco: Unable to  assess due to patient getting frustrated and unwilling to further engage in assessment Illicit drugs: Unable to assess due to patient getting frustrated and unwilling to further engage in assessment Prescription drug abuse: Unable to assess due to patient getting frustrated and unwilling to further engage in assessment Rehab hx: Unable to assess due to patient getting frustrated and unwilling to further engage in assessment  Exam Findings  Physical Exam: Reviewed and agree with the physical exam findings conducted by the medical provider Vital Signs:  Temp:  [97.5 F (36.4 C)-98.1 F (36.7 C)] 97.5 F (36.4 C) (12/06 1507) Pulse Rate:  [81-87] 81 (12/06 1507) Resp:  [16-18] 16 (12/06 1507) BP: (134-137)/(76-91) 137/76 (12/06 1507) SpO2:  [98 %-99 %] 98 % (12/06 1507) Weight:  [83.9 kg-86.2 kg] 86.2 kg (12/06 1504) Blood pressure 137/76, pulse 81, temperature (!) 97.5 F (36.4 C), temperature source Oral, resp. rate 16, height 5' 10.5 (1.791 m), weight 86.2 kg, SpO2 98%. Body mass index is 26.88 kg/m.    Mental Status Exam: General Appearance: Casual  Orientation:  Full (Time, Place, and Person)  Memory:  Fair  Concentration:  Concentration: Poor  Recall:  Fair  Attention  Poor  Eye Contact:  Minimal  Speech:  Clear and Coherent  Language:  Fair  Volume:  Normal  Mood: Irritable  Affect:  Congruent  Thought Process:  Coherent and Linear  Thought Content:  Rumination  Suicidal Thoughts:  No  Homicidal Thoughts:  No  Judgement:  Impaired  Insight:  Lacking  Psychomotor Activity:  Normal  Akathisia:  No  Fund of Knowledge:  Fair      Assets:  Housing Runner, Broadcasting/film/video  Cognition:  Has legal guardian  ADL's:  Intact  AIMS (if indicated):        Other History   These have been pulled in through the EMR, reviewed, and updated if appropriate.  Family History:  The patient's family history includes CAD in his mother and sister; Healthy in his father;  Obesity in his sister; Rheum arthritis in his mother.  Medical History: Past Medical History:  Diagnosis Date   Depression    Diabetes mellitus without complication (HCC)    Hyperlipidemia    Hypertension    Lupus anticoagulant disorder 06/14/2012   as per Dr.pandit's note in dec 2013.   PE (pulmonary thromboembolism) (HCC)    Schizo affective schizophrenia (HCC)    Supratherapeutic INR 11/19/2016    Surgical History: History reviewed. No pertinent surgical history.   Medications:   Current Facility-Administered Medications:    alum & mag hydroxide-simeth (MAALOX/MYLANTA) 200-200-20 MG/5ML suspension 30 mL, 30 mL, Oral, Q6H PRN, Viviann Pastor, MD   ibuprofen  (ADVIL ) tablet 600 mg, 600 mg, Oral, Q8H PRN, Viviann Pastor, MD, 600 mg at 05/20/24 1704   ondansetron  (ZOFRAN ) tablet 4 mg, 4 mg, Oral, Q8H PRN, Viviann Pastor, MD  Current Outpatient Medications:    albuterol  (VENTOLIN  HFA) 108 (90 Base) MCG/ACT inhaler, Inhale 2 puffs into the lungs every 6 (six) hours as needed for wheezing or shortness of breath., Disp: , Rfl:    clonazePAM  (KLONOPIN ) 2 MG tablet, Take 1 tablet (2 mg total) by mouth 2 (two) times daily for 15 days., Disp: 30 tablet, Rfl: 0   cloZAPine  (CLOZARIL ) 50 MG tablet, Take 5 tablets (250 mg total) by mouth at bedtime., Disp: 150 tablet, Rfl: 0   ELIQUIS  2.5 MG TABS tablet, Take 2.5 mg by mouth 2 (two) times daily., Disp: , Rfl:    fluticasone -salmeterol (ADVAIR) 250-50 MCG/ACT AEPB, Inhale 1 puff into the lungs 2 (two) times daily., Disp: 60 each, Rfl: 1   hydrOXYzine  (ATARAX ) 25 MG tablet, Take 1 tablet (25 mg total) by mouth daily as needed for anxiety., Disp: 30 tablet, Rfl: 0   lisinopril  (ZESTRIL ) 2.5 MG tablet, Take 1 tablet (2.5 mg total) by mouth daily., Disp: 30 tablet, Rfl: 1   lithium  carbonate (ESKALITH ) 450 MG ER tablet, Take 1 tablet (450 mg total) by mouth at bedtime., Disp: 30 tablet, Rfl: 1   melatonin 5 MG TABS, Take 5 mg by mouth at  bedtime., Disp: , Rfl:    metFORMIN  (GLUCOPHAGE ) 1000 MG tablet, Take 1 tablet (1,000 mg total) by mouth 2 (two) times daily with a meal., Disp: 60 tablet, Rfl: 1   propranolol  (INDERAL ) 20 MG tablet, Take 1 tablet (20 mg total) by mouth at bedtime., Disp: 30 tablet, Rfl: 1   risperiDONE  (RISPERDAL ) 1 MG tablet, Take 1 mg by mouth 2 (two) times daily., Disp: , Rfl:    simvastatin  (ZOCOR ) 40 MG tablet, Take 1 tablet (40 mg total) by mouth daily at 6 PM., Disp: 30 tablet, Rfl: 1   traZODone  (DESYREL ) 100 MG tablet, Take 1 tablet (100 mg total) by mouth at bedtime., Disp: 30 tablet, Rfl: 1  Allergies: Allergies  Allergen Reactions   Penicillins Other (See Comments)    Reaction:  lockjaw as teen Tolerated Augmentin /Unasyn  06/09/22      Zelda Sharps, NP This note was created using Dragon dictation software. Please excuse any inadvertent transcription errors. Case was discussed with supervising physician Dr. [name] who is agreeable with current plan.

## 2024-05-20 NOTE — BH Assessment (Signed)
 Comprehensive Clinical Assessment (CCA) Screening, Triage and Referral Note  05/20/2024 [name] [id number]  Chief Complaint: No chief complaint on file.  Visit Diagnosis: Schizophrenia acute undifferentiated noted in chart review(HCC)   Lance [name]. [name] is a 48 year old male who presents to the ER due to having "bad thoughts" and they are getting worse. Patient states he does not want to end his life but having thoughts of dying. Patient reports his medication is causing anxiety and he knows he is going to die soon. During the interview, he was calm, cooperative and pleasant. He was able to provide appropriate answers to the questions. He denies SI/HI and V/H. He endorses A/H.  Patient Reported Information How did you hear about us ? Self  What Is the Reason for Your Visit/Call Today? Patient states he is having "bad thoughts" and they are getting worse.  How Long Has This Been Causing You Problems? 1 wk - 1 month  What Do You Feel Would Help You the Most Today? Treatment for Depression or other mood problem   Have You Recently Had Any Thoughts About Hurting Yourself? Yes  Are You Planning to Commit Suicide/Harm Yourself At This time? No   Have you Recently Had Thoughts About Hurting Someone [name]? No  Are You Planning to Harm Someone at This Time? No  Explanation: Denies HI   Have You Used Any Alcohol or Drugs in the Past 24 Hours? No  How Long Ago Did You Use Drugs or Alcohol? No data recorded What Did You Use and How Much? Pt reports drinking one small can of beer earlier today   Do You Currently Have a Therapist/Psychiatrist? Yes  Name of Therapist/Psychiatrist: Group Home   Have You Been Recently Discharged From Any Office Practice or Programs? No  Explanation of Discharge From Practice/Program: [facility]    CCA Screening Triage Referral Assessment Type of Contact: Face-to-Face  Telemedicine Service Delivery:   Is this  Initial or Reassessment?   Date Telepsych consult ordered in [facility]:    Time Telepsych consult ordered in [facility]:    Location of Assessment: [facility] ED  Provider Location: [facility] ED    Collateral Involvement: none   Does Patient Have a Automotive Engineer Guardian? No data recorded Name and Contact of Legal Guardian: No data recorded If Minor and Not Living with Parent(s), Who has Custody? n/a  Is CPS involved or ever been involved? Never  Is APS involved or ever been involved? Never   Patient Determined To Be At Risk for Harm To Self or Others Based on Review of Patient Reported Information or Presenting Complaint? No  Method: No Plan  Availability of Means: No access or NA  Intent: Vague intent or NA  Notification Required: No need or identified person  Additional Information for Danger to Others Potential: -- (n/a)  Additional Comments for Danger to Others Potential: n/a  Are There Guns or Other Weapons in Your Home? No  Types of Guns/Weapons: Denies access  Are These Weapons Safely Secured?                            -- (n/a)  Who Could Verify You Are Able To Have These Secured: Denies access  Do You Have any Outstanding Charges, Pending Court Dates, Parole/Probation? Denies pending legal charges  Contacted To Inform of Risk of Harm To Self or Others: -- (n/a)   Does Patient Present under Involuntary  Commitment? No    [place] of Residence: [place]   Patient Currently Receiving the Following Services: Group Home; Medication Management   Determination of Need: Emergent (2 hours)   Options For Referral: ED Visit   Disposition Recommendation per psychiatric provider: Observe overnight and reassess tomorrow (05/21/2024).  [name], [name], [facility], [facility] Therapeutic Triage Specialist 05/20/2024 6:14 PM

## 2024-05-20 NOTE — ED Provider Notes (Signed)
 [facility] Provider Note    Event Date/Time   First MD Initiated Contact with Patient 05/20/24 1521     (approximate)   History   Chief Complaint: Auditory hallucinations  HPI  Lance [name] is a 48 y.o. male with a history of bipolar disorder, hypertension diabetes schizophrenia who is brought to the ED under IVC due to being found laying on the ground, reporting hearing voices.  Denies pain or recent illness.  No trauma.     Physical Exam   Triage Vital Signs: ED Triage Vitals  Encounter Vitals Group     BP 05/20/24 1507 137/76     Girls Systolic BP Percentile --      Girls Diastolic BP Percentile --      Boys Systolic BP Percentile --      Boys Diastolic BP Percentile --      Pulse Rate 05/20/24 1507 81     Resp 05/20/24 1507 16     Temp 05/20/24 1507 (!) 97.5 F (36.4 C)     Temp Source 05/20/24 1507 Oral     SpO2 05/20/24 1507 98 %     Weight 05/20/24 1504 190 lb (86.2 kg)     Height 05/20/24 1504 5' 10.5 (1.791 m)     Head Circumference --      Peak Flow --      Pain Score 05/20/24 1503 7     Pain Loc --      Pain Education --      Exclude from Growth Chart --     Most recent vital signs: Vitals:   05/20/24 1507  BP: 137/76  Pulse: 81  Resp: 16  Temp: (!) 97.5 F (36.4 C)  SpO2: 98%    General: Awake, no distress. CV:  Good peripheral perfusion.  Resp:  Normal effort.  Abd:  No distention.  Other:  No wounds   ED Results / Procedures / Treatments   Labs (all labs ordered are listed, but only abnormal results are displayed) Labs Reviewed  COMPREHENSIVE METABOLIC PANEL WITH GFR - Abnormal; Notable for the following components:      Result Value   Glucose, Bld 145 (*)    Creatinine, Ser 1.45 (*)    GFR, Estimated 59 (*)    All other components within normal limits  LITHIUM  LEVEL - Abnormal; Notable for the following components:   Lithium  Lvl 0.42 (*)    All other components within normal limits  ETHANOL   CBC  CBC WITH DIFFERENTIAL/PLATELET  URINE DRUG SCREEN     EKG    RADIOLOGY    PROCEDURES:  Procedures   MEDICATIONS ORDERED IN ED: Medications  ibuprofen  (ADVIL ) tablet 600 mg (600 mg Oral Given 05/20/24 1704)  ondansetron  (ZOFRAN ) tablet 4 mg (has no administration in time range)  alum & mag hydroxide-simeth (MAALOX/MYLANTA) 200-200-20 MG/5ML suspension 30 mL (has no administration in time range)  clonazePAM  (KLONOPIN ) tablet 2 mg (has no administration in time range)  cloZAPine  (CLOZARIL ) tablet 250 mg (has no administration in time range)  apixaban  (ELIQUIS ) tablet 2.5 mg (has no administration in time range)  hydrOXYzine  (ATARAX ) tablet 25 mg (has no administration in time range)  lisinopril  (ZESTRIL ) tablet 2.5 mg (has no administration in time range)  lithium  carbonate (ESKALITH ) ER tablet 450 mg (has no administration in time range)  melatonin tablet 5 mg (has no administration in time range)  metFORMIN  (GLUCOPHAGE ) tablet 1,000 mg (has no administration in time range)  propranolol  (INDERAL ) tablet 20 mg (has no administration in time range)  risperiDONE  (RISPERDAL ) tablet 1 mg (has no administration in time range)  simvastatin  (ZOCOR ) tablet 40 mg (has no administration in time range)  traZODone  (DESYREL ) tablet 100 mg (has no administration in time range)  clonazePAM  (KLONOPIN ) tablet 2 mg (2 mg Oral Given 05/20/24 1726)  haloperidol  (HALDOL ) tablet 5 mg (5 mg Oral Given 05/20/24 1807)    Or  haloperidol  lactate (HALDOL ) injection 5 mg ( Intramuscular See Alternative 05/20/24 1807)     IMPRESSION / MDM / ASSESSMENT AND PLAN / ED COURSE  I reviewed the triage vital signs and the nursing notes.  Patient's presentation is most consistent with acute presentation with potential threat to life or bodily function.  No acute complaints.  Medically stable.  The patient has been placed in psychiatric observation due to the need to provide a safe environment for the  patient while obtaining psychiatric consultation and evaluation, as well as ongoing medical and medication management to treat the patient's condition.  The patient has been placed under full IVC at this time.  Clinical Course as of 05/20/24 2240  Sat May 20, 2024  1806 Psych is evaluated patient, wants to observe overnight.  Anticipates discharge back to his group home.  On hearing that he may be discharged, patient started biting himself.  Seems to be malingering behavior.  Will offer oral Haldol . [PS]    Clinical Course User Index [PS] Viviann Pastor, MD     FINAL CLINICAL IMPRESSION(S) / ED DIAGNOSES   Final diagnoses:  Bipolar affective disorder, current episode depressed, current episode severity unspecified (HCC)     Rx / DC Orders   ED Discharge Orders     None        Note:  This document was prepared using Dragon voice recognition software and may include unintentional dictation errors.   Viviann Pastor, MD 05/20/24 2241

## 2024-05-20 NOTE — ED Triage Notes (Signed)
 Patient arrived with IVC papers. Patient was transported to the crisis center by law enforcement after being found lying in front of tobacco store. Upon contact, he reported hearing voices. He has known history of responding to command auditory hallucinations, including previous incident resulting in one year period of incarceration for attempted murder related to these symptoms. He reports the voices are currently screaming in his head. Patient not taking prescribed psychiatric medications at this time  Patient reports  I have made a wish to be god  Denies SI/HI at this time.

## 2024-05-20 NOTE — ED Notes (Signed)
 Dinner tray provided to pt

## 2024-05-20 NOTE — ED Notes (Signed)
Patient resting quietly, no acute distress noted.

## 2024-05-21 ENCOUNTER — Inpatient Hospital Stay
Admission: AD | Admit: 2024-05-21 | Discharge: 2024-06-06 | DRG: 885 | Disposition: A | Discharge status: Home / Self Care | Attending: Child & Adolescent Psychiatry | Admitting: Child & Adolescent Psychiatry

## 2024-05-21 ENCOUNTER — Inpatient Hospital Stay: Admission: RE | Admit: 2024-05-21 | Source: Home / Self Care | Admitting: Child & Adolescent Psychiatry

## 2024-05-21 DIAGNOSIS — D6862 Lupus anticoagulant syndrome: Secondary | ICD-10-CM | POA: Diagnosis present

## 2024-05-21 DIAGNOSIS — F209 Schizophrenia, unspecified: Secondary | ICD-10-CM | POA: Diagnosis not present

## 2024-05-21 DIAGNOSIS — Z7901 Long term (current) use of anticoagulants: Secondary | ICD-10-CM

## 2024-05-21 DIAGNOSIS — Z7951 Long term (current) use of inhaled steroids: Secondary | ICD-10-CM

## 2024-05-21 DIAGNOSIS — E119 Type 2 diabetes mellitus without complications: Secondary | ICD-10-CM | POA: Diagnosis present

## 2024-05-21 DIAGNOSIS — Z79899 Other long term (current) drug therapy: Secondary | ICD-10-CM | POA: Diagnosis not present

## 2024-05-21 DIAGNOSIS — E785 Hyperlipidemia, unspecified: Secondary | ICD-10-CM | POA: Diagnosis present

## 2024-05-21 DIAGNOSIS — Z880 Allergy status to penicillin: Secondary | ICD-10-CM | POA: Diagnosis not present

## 2024-05-21 DIAGNOSIS — F200 Paranoid schizophrenia: Principal | ICD-10-CM | POA: Diagnosis present

## 2024-05-21 DIAGNOSIS — I10 Essential (primary) hypertension: Secondary | ICD-10-CM | POA: Diagnosis present

## 2024-05-21 DIAGNOSIS — Z86711 Personal history of pulmonary embolism: Secondary | ICD-10-CM | POA: Diagnosis not present

## 2024-05-21 DIAGNOSIS — Z5900 Homelessness unspecified: Secondary | ICD-10-CM

## 2024-05-21 DIAGNOSIS — Z9152 Personal history of nonsuicidal self-harm: Secondary | ICD-10-CM

## 2024-05-21 DIAGNOSIS — Z8249 Family history of ischemic heart disease and other diseases of the circulatory system: Secondary | ICD-10-CM | POA: Diagnosis not present

## 2024-05-21 DIAGNOSIS — Z8261 Family history of arthritis: Secondary | ICD-10-CM | POA: Diagnosis not present

## 2024-05-21 DIAGNOSIS — Z7952 Long term (current) use of systemic steroids: Secondary | ICD-10-CM

## 2024-05-21 DIAGNOSIS — R45851 Suicidal ideations: Secondary | ICD-10-CM | POA: Diagnosis present

## 2024-05-21 DIAGNOSIS — Z7984 Long term (current) use of oral hypoglycemic drugs: Secondary | ICD-10-CM | POA: Diagnosis not present

## 2024-05-21 DIAGNOSIS — Z9151 Personal history of suicidal behavior: Secondary | ICD-10-CM | POA: Diagnosis not present

## 2024-05-21 DIAGNOSIS — F17210 Nicotine dependence, cigarettes, uncomplicated: Secondary | ICD-10-CM | POA: Diagnosis present

## 2024-05-21 DIAGNOSIS — G4700 Insomnia, unspecified: Secondary | ICD-10-CM | POA: Diagnosis present

## 2024-05-21 LAB — URINE DRUG SCREEN
Amphetamines: NEGATIVE
Barbiturates: NEGATIVE
Benzodiazepines: POSITIVE — AB
Cocaine: NEGATIVE
Fentanyl: NEGATIVE
Methadone Scn, Ur: NEGATIVE
Opiates: NEGATIVE
Tetrahydrocannabinol: NEGATIVE

## 2024-05-21 MED ORDER — HALOPERIDOL LACTATE 5 MG/ML IJ SOLN
5.0000 mg | Freq: Three times a day (TID) | INTRAMUSCULAR | Status: DC | PRN
  Administered 2024-05-21: 5 mg via INTRAMUSCULAR
  Filled 2024-05-21: qty 1

## 2024-05-21 MED ORDER — LORAZEPAM 2 MG/ML IJ SOLN
2.0000 mg | Freq: Three times a day (TID) | INTRAMUSCULAR | Status: DC | PRN
  Filled 2024-05-21: qty 1

## 2024-05-21 MED ORDER — FLUTICASONE FUROATE-VILANTEROL 200-25 MCG/ACT IN AEPB
1.0000 | INHALATION_SPRAY | Freq: Every day | RESPIRATORY_TRACT | Status: DC
  Administered 2024-05-21 – 2024-06-06 (×9): 1 via RESPIRATORY_TRACT
  Filled 2024-05-21 (×2): qty 28

## 2024-05-21 MED ORDER — CLONAZEPAM 1 MG PO TABS
2.0000 mg | ORAL_TABLET | Freq: Two times a day (BID) | ORAL | Status: DC
  Administered 2024-05-21 – 2024-05-23 (×4): 2 mg via ORAL
  Filled 2024-05-21 (×4): qty 2

## 2024-05-21 MED ORDER — CLOZAPINE 25 MG PO TABS
250.0000 mg | ORAL_TABLET | Freq: Every day | ORAL | Status: DC
  Administered 2024-05-21 – 2024-06-05 (×16): 250 mg via ORAL
  Filled 2024-05-21 (×18): qty 2

## 2024-05-21 MED ORDER — RISPERIDONE 1 MG PO TABS
1.0000 mg | ORAL_TABLET | Freq: Two times a day (BID) | ORAL | Status: DC
  Administered 2024-05-21 – 2024-05-24 (×7): 1 mg via ORAL
  Filled 2024-05-21 (×8): qty 1

## 2024-05-21 MED ORDER — LORAZEPAM 2 MG/ML IJ SOLN
2.0000 mg | Freq: Three times a day (TID) | INTRAMUSCULAR | Status: DC | PRN
  Administered 2024-05-21: 2 mg via INTRAMUSCULAR

## 2024-05-21 MED ORDER — ACETAMINOPHEN 325 MG PO TABS
650.0000 mg | ORAL_TABLET | Freq: Four times a day (QID) | ORAL | Status: DC | PRN
  Administered 2024-05-22: 650 mg via ORAL
  Filled 2024-05-21: qty 2

## 2024-05-21 MED ORDER — MELATONIN 5 MG PO TABS
5.0000 mg | ORAL_TABLET | Freq: Every day | ORAL | Status: DC
  Administered 2024-05-22: 5 mg via ORAL
  Filled 2024-05-21 (×2): qty 1

## 2024-05-21 MED ORDER — APIXABAN 2.5 MG PO TABS
2.5000 mg | ORAL_TABLET | Freq: Two times a day (BID) | ORAL | Status: DC
  Administered 2024-05-21 – 2024-06-06 (×32): 2.5 mg via ORAL
  Filled 2024-05-21 (×33): qty 1

## 2024-05-21 MED ORDER — DIPHENHYDRAMINE HCL 50 MG/ML IJ SOLN
50.0000 mg | Freq: Three times a day (TID) | INTRAMUSCULAR | Status: DC | PRN
  Filled 2024-05-21: qty 1

## 2024-05-21 MED ORDER — PROPRANOLOL HCL 20 MG PO TABS
20.0000 mg | ORAL_TABLET | Freq: Every day | ORAL | Status: DC
  Administered 2024-05-22: 20 mg via ORAL
  Filled 2024-05-21: qty 1

## 2024-05-21 MED ORDER — ALUM & MAG HYDROXIDE-SIMETH 200-200-20 MG/5ML PO SUSP
30.0000 mL | ORAL | Status: DC | PRN
  Administered 2024-06-03: 30 mL via ORAL
  Filled 2024-05-21: qty 30

## 2024-05-21 MED ORDER — SIMVASTATIN 20 MG PO TABS
40.0000 mg | ORAL_TABLET | Freq: Every day | ORAL | Status: DC
  Administered 2024-05-21 – 2024-06-05 (×16): 40 mg via ORAL
  Filled 2024-05-21 (×7): qty 2
  Filled 2024-05-21: qty 1
  Filled 2024-05-21 (×9): qty 2
  Filled 2024-05-21: qty 1
  Filled 2024-05-21: qty 2
  Filled 2024-05-21: qty 1

## 2024-05-21 MED ORDER — ALBUTEROL SULFATE HFA 108 (90 BASE) MCG/ACT IN AERS: 2.0000 | INHALATION_SPRAY | Freq: Four times a day (QID) | RESPIRATORY_TRACT | Status: DC | PRN

## 2024-05-21 MED ORDER — DIPHENHYDRAMINE HCL 50 MG/ML IJ SOLN
50.0000 mg | Freq: Three times a day (TID) | INTRAMUSCULAR | Status: DC | PRN
  Administered 2024-05-21: 50 mg via INTRAMUSCULAR

## 2024-05-21 MED ORDER — HALOPERIDOL LACTATE 5 MG/ML IJ SOLN: 10.0000 mg | Freq: Three times a day (TID) | INTRAMUSCULAR | Status: DC | PRN

## 2024-05-21 MED ORDER — LISINOPRIL 5 MG PO TABS
2.5000 mg | ORAL_TABLET | Freq: Every day | ORAL | Status: DC
  Administered 2024-05-22 – 2024-06-06 (×16): 2.5 mg via ORAL
  Filled 2024-05-21 (×16): qty 1

## 2024-05-21 MED ORDER — LITHIUM CARBONATE ER 450 MG PO TBCR
450.0000 mg | EXTENDED_RELEASE_TABLET | Freq: Every day | ORAL | Status: DC
  Administered 2024-05-22 – 2024-05-31 (×10): 450 mg via ORAL
  Filled 2024-05-21 (×10): qty 1

## 2024-05-21 MED ORDER — HYDROXYZINE HCL 25 MG PO TABS
25.0000 mg | ORAL_TABLET | Freq: Every day | ORAL | Status: DC | PRN
  Administered 2024-05-21 – 2024-06-01 (×6): 25 mg via ORAL
  Filled 2024-05-21 (×6): qty 1

## 2024-05-21 MED ORDER — HALOPERIDOL 5 MG PO TABS
5.0000 mg | ORAL_TABLET | Freq: Three times a day (TID) | ORAL | Status: DC | PRN
  Administered 2024-05-22: 5 mg via ORAL
  Filled 2024-05-21: qty 1

## 2024-05-21 MED ORDER — METFORMIN HCL 500 MG PO TABS
1000.0000 mg | ORAL_TABLET | Freq: Two times a day (BID) | ORAL | Status: DC
  Administered 2024-05-21 – 2024-06-06 (×32): 1000 mg via ORAL
  Filled 2024-05-21 (×32): qty 2

## 2024-05-21 MED ORDER — TRAZODONE HCL 100 MG PO TABS
100.0000 mg | ORAL_TABLET | Freq: Every day | ORAL | Status: DC
  Administered 2024-05-21 – 2024-06-05 (×14): 100 mg via ORAL
  Filled 2024-05-21 (×15): qty 1

## 2024-05-21 MED ORDER — MAGNESIUM HYDROXIDE 400 MG/5ML PO SUSP
30.0000 mL | Freq: Every day | ORAL | Status: DC | PRN
  Administered 2024-05-23 – 2024-05-24 (×2): 30 mL via ORAL
  Filled 2024-05-21 (×2): qty 30

## 2024-05-21 MED ORDER — DIPHENHYDRAMINE HCL 25 MG PO CAPS
50.0000 mg | ORAL_CAPSULE | Freq: Three times a day (TID) | ORAL | Status: DC | PRN
  Administered 2024-05-22: 50 mg via ORAL
  Filled 2024-05-21: qty 2

## 2024-05-21 NOTE — Plan of Care (Signed)
  Problem: Education: Goal: Emotional status will improve Outcome: Progressing Goal: Mental status will improve Outcome: Progressing Goal: Verbalization of understanding the information provided will improve Outcome: Progressing   Problem: Activity: Goal: Interest or engagement in activities will improve Outcome: Progressing   Problem: Coping: Goal: Ability to verbalize frustrations and anger appropriately will improve Outcome: Progressing   Problem: Physical Regulation: Goal: Ability to maintain clinical measurements within normal limits will improve Outcome: Progressing

## 2024-05-21 NOTE — Group Note (Signed)
 Date:  05/21/2024 Time:  10:30 PM  Group Topic/Focus:  Wrap-Up Group:   The focus of this group is to help patients review their daily goal of treatment and discuss progress on daily workbooks.    Participation Level:  Did Not Attend    Arlester CHRISTELLA Servant 05/21/2024, 10:30 PM

## 2024-05-21 NOTE — BH Assessment (Signed)
 Patient is to be admitted to [facility] on today 05/21/24 by Dr. Ruther.  Attending Physician will be Dr. Ruther.   Patient has been assigned to room 313, by [facility] Charge Nurse Leita.    ER staff is aware of the admission: Ronnie, ER Secretary   Dr. Waymond, ER MD  Laneta, Patient's Nurse  [facility] Patient Access.

## 2024-05-21 NOTE — ED Notes (Signed)
 Lunch tray provided to pt.

## 2024-05-21 NOTE — Progress Notes (Signed)
 Contact made with patient's legal guardian to advise of patient's admission to [facility]. She states that she is aware and will call back if she has any questions.

## 2024-05-21 NOTE — ED Provider Notes (Signed)
 Emergency Medicine Observation Re-evaluation Note  Brandonn Capelli is a 48 y.o. male, seen on rounds today.  Pt initially presented to the ED for complaints of No chief complaint on file. Currently, the patient is resting.  Physical Exam  BP 132/87 (BP Location: Right Arm)   Pulse 75   Temp 98.8 F (37.1 C) (Oral)   Resp 16   Ht 5' 10.5 (1.791 m)   Wt 86.2 kg   SpO2 99%   BMI 26.88 kg/m  Physical Exam .Gen:  No acute distress Resp:  Breathing easily and comfortably, no accessory muscle usage Neuro:  Moving all four extremities, no gross focal neuro deficits Psych:  Resting currently, calm when awake   ED Course / MDM  EKG:   I have reviewed the labs performed to date as well as medications administered while in observation.  Recent changes in the last 24 hours include no acute events.  Plan  Current plan is for psych dispo.    Waymond Lorelle Cummins, MD 05/21/24 [phone]

## 2024-05-21 NOTE — Progress Notes (Signed)
 Patient arrived to the unit and is alert and oriented x 4. He was slightly lethargic on admission. During his admission assessment, the patient did not contract for safety. He stated that he is actively suicidal and plans to kill himself while he is admitted at [facility]. He states that he is going to die anyway so why not just kill himself. He went on to say that he is hearing voices that are telling his that he is not worth anything and that he should kill himself. Provider was notified and patient was placed on 1:1 observation.   Patient was unsteady on his feet and required standby assistance from RN to ambulate to the day room. While in the day room, the patient became agitated and threw coffee across the room which resulted in a stained table and walls. PRN haldol , benadryl  and ativan  were administered to the patient via IM injection (see MAR). Patient was advised not to damage property on the unit moving forward to which he verbally agreed. Will continue 1:1.     05/21/24 1330  Psych Admission Type (Psych Patients Only)  Admission Status Voluntary  Psychosocial Assessment  Patient Complaints Anxiety;Depression  Eye Contact Avoids  Facial Expression Flat  Affect Irritable  Speech Argumentative  Interaction Hostile  Motor Activity Slow  Behavior Characteristics Anxious;Agitated  Mood Anxious;Irritable  Thought Process  Coherency WDL  Content WDL  Delusions None reported or observed  Perception Hallucinations  Hallucination Auditory;Command  Judgment Impaired  Confusion WDL  Danger to Self  Current suicidal ideation? Active  Self-Injurious Behavior Some self-injurious ideation observed or expressed.  No lethal plan expressed   Agreement Not to Harm Self No  Danger to Others  Danger to Others None reported or observed

## 2024-05-21 NOTE — Group Note (Signed)
 LCSW Group Therapy Note  Group Date: 05/21/2024 Start Time: 1510 End Time: 1550   Type of Therapy and Topic:  Group Therapy - Healthy vs Unhealthy Coping Skills  Participation Level:  Did Not Attend   Description of Group The focus of this group was to determine what unhealthy coping techniques typically are used by group members and what healthy coping techniques would be helpful in coping with various problems. Patients were guided in becoming aware of the differences between healthy and unhealthy coping techniques. Patients were asked to identify 2-3 healthy coping skills they would like to learn to use more effectively.  Therapeutic Goals Patients learned that coping is what human beings do all day long to deal with various situations in their lives Patients defined and discussed healthy vs unhealthy coping techniques Patients identified their preferred coping techniques and identified whether these were healthy or unhealthy Patients determined 2-3 healthy coping skills they would like to become more familiar with and use more often. Patients provided support and ideas to each other   Summary of Patient Progress:  Patient did not attend group.   Roselyn GORMAN Lento, LCSWA 05/21/2024  4:44 PM

## 2024-05-21 NOTE — BH Assessment (Signed)
 Writer called patient's legal guardian [name] [phone] and left message to return call. Calling to inform of patient disposition and placement.

## 2024-05-21 NOTE — Consult Note (Signed)
 [facility] Psychiatric Consult Follow Up  Patient Name: .Miko Sirico  MRN: [id number]  DOB: 1975-06-19  Consult Order details:  Orders (From admission, onward)     Start     Ordered   05/20/24 1522  CONSULT TO CALL ACT TEAM       Ordering Provider: Viviann Pastor, MD  Provider:  (Not yet assigned)  Question:  Reason for Consult?  Answer:  Psych consult   05/20/24 1521   05/20/24 1522  IP CONSULT TO PSYCHIATRY       Ordering Provider: Viviann Pastor, MD  Provider:  (Not yet assigned)  Question Answer Comment  Consult Timeframe URGENT - requires response within 12 hours   URGENT timeframe requires provider to provider communication, has the provider to provider communication been completed Yes   Reason for Consult? 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 male admitted: Presented to the EDfor 05/20/2024  3:15 PM for Patient arrived with IVC papers. Patient was transported to the crisis center by law enforcement after being found lying in front of tobacco store. Upon contact, he reported hearing voices. SABRA He carries the psychiatric diagnoses of Schizophrenia and has a past medical history of  Chart review significant for diabetes mellitus, hx of pulmonary embolus.   On current presentation, patient reported I am having a bad time.  Current assessment limited due to patient getting frustrated and stating I just cannot think right now.  Patient reporting ongoing negative thoughts that he is going to die and believes that it is going to happen soon.   Patient reported that these thoughts just started today.  Patient was recently seen in the emergency room yesterday for similar.  Today on exam, patient denied suicidal or homicidal thoughts.  Patient denied auditory hallucinations, he described what was going along currently as negative thoughts.  Patient reported visual hallucinations today and reported he can see many faces.  Patient did not appear to be responding to internal stimuli on current exam.  Patient reported he believes his medications are causing increased anxiety.  Patient was unsure if any medication changes had been made recently.  Patient was unsure of current outpatient psychiatric provider. Unsure of current medication compliance.  Patient got frustrated and irritated with continued questioning by psychiatry team. It is noted in chart that patient has legal guardian listed as [name].  Psychiatry team attempted to contact Vivian, HIPAA compliant voicemail left at this time requesting return call.  Patient reported that he walked away from group home last night and had been walking around in the streets all night and laid in front of the tobacco store due to feeling mentally and physically unwell. Given the patient's recent admission to the inpatient unit and history of frequent emergency room visits, as well as current concerning behaviors--including walking away from the group home and lying in front of a tobacco store--it is recommended that the patient be observed overnight in the emergency room. This will allow for continued monitoring of  the patient's behavior and assessment of any potential underlying medical or psychological concerns that may require intervention.  05/21/2024: The patient was seen today on rounds and reported experiencing suicidal thoughts. When asked regarding the specifics of their plan, the patient became noticeably irritated, responding with a loud statement I know I am going to die, but did not provide  further elaboration on intent or plan. The patient was significantly irritable during the interaction with the psychiatry team.  The patient endorsed high levels of anxiety and, when asked about safety planning, patient responded with I will probably just kill myself, which raises significant concerns regarding current safety. The patient is currently unable to contract for safety, indicating a current high risk of self-harm.  Tthe patient denied homicidal ideations and auditory hallucinations. However, when asked about visual hallucinations, the patient became upset and reiterated, I already told you yesterday I see faces and refused to discuss these symptoms further.  Patient is currently IVC, patient will be recommended for inpatient psychiatric admission for further monitoring and stabilization.    Diagnoses:  Active Hospital problems: Active Problems:   Suicidal thoughts   Schizophrenia, 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HCC)    Plan   ## Psychiatric Medication Recommendations:  Recommend home medications be ordered after medication reconciliation performed  ## Medical Decision Making Capacity: Patient has a guardian and has thus been adjudicated incompetent; please involve patients guardian in medical decision making  ## Further Work-up:   -- most recent EKG on 05/01/2024 had QtC of 480 -- Pertinent labwork reviewed earlier this admission includes: CBC, CMP, ethanol, pending urine drug screen, this provider added on lithium  level as well as differential to CBC due to reported Clozaril  therapy   ## Disposition:--Recommended for inpatient psychiatric admission for further monitoring and stabilization  ## Behavioral / Environmental: -Utilize compassion and acknowledge the patient's experiences while setting clear and realistic expectations for care.    ## Safety and Observation Level:  - Based on my clinical evaluation, I estimate the patient to be at low risk of self harm in the  current setting.  Patient currently denying suicidal thoughts at this time.  Unit can continue with protocol every 15 minute rounding. - At this time, we recommend  routine. This decision is based on my review of the chart including patient's history and current presentation, interview of the patient, mental status examination, and consideration of suicide risk including evaluating suicidal ideation, plan, intent, suicidal or self-harm behaviors, risk factors, and protective factors. This judgment is based on our ability to directly address suicide risk, implement suicide prevention strategies, and develop a safety plan while the patient is in the clinical setting. Please contact our team if there is a concern that risk level has changed.  CSSR Risk Category:C-SSRS RISK CATEGORY: No Risk  Suicide Risk Assessment: Patient has following modifiable risk factors for suicide: medication noncompliance and current symptoms: anxiety/panic, insomnia, impulsivity, anhedonia, hopelessness, which we are addressing by recommending to observe overnight and reassess in the morning. Patient has following non-modifiable or demographic risk factors for suicide: male gender and psychiatric hospitalization Patient has the following protective factors against suicide: Access to outpatient mental health care  Thank you for this consult request. Recommendations have been communicated to the primary team.  We will sign off at this time.   Zelda Sharps, NP        History of Present Illness  Relevant Aspects of Hospital ED   Patient Report:  On current presentation, patient reported I am having  a bad time.  Current assessment limited due to patient getting frustrated and stating I just cannot think right now.  Patient reporting ongoing negative thoughts that he is going to die and believes that it is going to happen soon.  Patient reported that these thoughts just started today.  Patient was recently seen in the  emergency room yesterday for similar.  Today on exam, patient denied suicidal or homicidal thoughts.  Patient denied auditory hallucinations, he described what was going on currently as negative thoughts.  Patient reported visual hallucinations today and reported he can see many faces.  Patient did not appear to be responding to internal stimuli on current exam.  Patient reported he believes his medications are causing increased anxiety.  Patient was unsure if any medication changes had been made recently.  Patient reported similar complaints yesterday.  Patient was unsure of current outpatient psychiatric provider.  Unsure of current medication compliance.  Patient got frustrated and irritated with continued questioning by psychiatry team and refused to engage further.  It is noted in chart that patient has legal guardian listed as [name].  Psychiatry team attempted to contact Vivian, HIPAA compliant voicemail left at this time requesting return call.  Patient reported that he walked away from group home last night and had been walking around in the streets all night and laid in front of the tobacco store due to feeling mentally and physically unwell.  Patient was discharged from this facility last night at 2312.  Given the patient's recent admission to the inpatient unit and history of frequent emergency room visits, as well as current concerning behaviors--including walking away from the group home and lying in front of a tobacco store (waiting to obtain collateral information for verification)--it is recommended that the patient be observed overnight in the emergency room. This will allow for continued monitoring of the patient's behavior and assessment of any potential underlying medical or psychological concerns that may require intervention.  Psych ROS:  Depression: Endorsed Anxiety: Endorsed Mania (lifetime and current): Denied Psychosis: (lifetime and current): Schizophrenia documented in  patient chart  Collateral information:  Attempted to contact legal guardian whose number is listed on file.  HIPAA compliant voice message left requesting return call    Psychiatric and Social History  Psychiatric History:  Information collected from patient and chart review  Prev Dx/Sx: Schizoaffective disorder documented in patient chart Current Psych Provider: Patient was unsure, but is established with outpatient providers Home Meds (current): Patient unsure, did report currently on Clozaril  Previous Med Trials: Unsure Therapy: Unsure  Prior Psych Hospitalization: Yes Prior Self Harm: One distant SA age 26 yo overdose  Prior Violence: Previous incarceration for attempted murder  Family Psych History: Unsure Family Hx suicide: Unsure  Social History:    Occupational Hx: Unemployed Legal Hx: See above Living Situation: Group home Access to weapons/lethal means: Denied  Substance History Alcohol: Unable to assess due to patient getting frustrated and unwilling to further engage in assessment Tobacco: Unable to assess due to patient getting frustrated and unwilling to further engage in assessment Illicit drugs: Unable to assess due to patient getting frustrated and unwilling to further engage in assessment Prescription drug abuse: Unable to assess due to patient getting frustrated and unwilling to further engage in assessment Rehab hx: Unable to assess due to patient getting frustrated and unwilling to further engage in assessment  Exam Findings  Physical Exam: Reviewed and agree with the physical exam findings conducted by the medical provider Vital Signs:  Temp:  [97.5 F (36.4 C)-98.8 F (37.1 C)] 98.5 F (36.9 C) (12/07 0834) Pulse Rate:  [63-81] 63 (12/07 0834) Resp:  [15-16] 15 (12/07 0834) BP: (107-137)/(65-87) 107/65 (12/07 0834) SpO2:  [98 %-100 %] 100 % (12/07 0834) Weight:  [86.2 kg] 86.2 kg (12/06 1504) Blood pressure 107/65, pulse 63, temperature 98.5  F (36.9 C), temperature source Oral, resp. rate 15, height 5' 10.5 (1.791 m), weight 86.2 kg, SpO2 100%. Body mass index is 26.88 kg/m.    Mental Status Exam: General Appearance: Casual  Orientation:  Full (Time, Place, and Person)  Memory:  Fair  Concentration:  Concentration: Poor  Recall:  Fair  Attention  Poor  Eye Contact:  Minimal  Speech:  Clear and Coherent  Language:  Fair  Volume:  Normal  Mood: Irritable  Affect:  Congruent  Thought Process:  Coherent and Linear  Thought Content:  Rumination  Suicidal Thoughts:  Endorsed today  Homicidal Thoughts:  No  Judgement:  Impaired  Insight:  Lacking  Psychomotor Activity:  Normal  Akathisia:  No  Fund of Knowledge:  Fair      Assets:  Housing Runner, Broadcasting/film/video  Cognition:  Has legal guardian  ADL's:  Intact  AIMS (if indicated):        Other History   These have been pulled in through the EMR, reviewed, and updated if appropriate.  Family History:  The patient's family history includes CAD in his mother and sister; Healthy in his father; Obesity in his sister; Rheum arthritis in his mother.  Medical History: Past Medical History:  Diagnosis Date   Depression    Diabetes mellitus without complication (HCC)    Hyperlipidemia    Hypertension    Lupus anticoagulant disorder 06/14/2012   as per Dr.pandit's note in dec 2013.   PE (pulmonary thromboembolism) (HCC)    Schizo affective schizophrenia (HCC)    Supratherapeutic INR 11/19/2016    Surgical History: History reviewed. No pertinent surgical history.   Medications:   Current Facility-Administered Medications:    alum & mag hydroxide-simeth (MAALOX/MYLANTA) 200-200-20 MG/5ML suspension 30 mL, 30 mL, Oral, Q6H PRN, Viviann Pastor, MD   apixaban  (ELIQUIS ) tablet 2.5 mg, 2.5 mg, Oral, BID, Viviann Pastor, MD, 2.5 mg at 05/21/24 9056   clonazePAM  (KLONOPIN ) tablet 2 mg, 2 mg, Oral, BID, Viviann Pastor, MD, 2 mg at 05/21/24 9056    cloZAPine  (CLOZARIL ) tablet 250 mg, 250 mg, Oral, QHS, Viviann Pastor, MD, 250 mg at 05/21/24 0011   hydrOXYzine  (ATARAX ) tablet 25 mg, 25 mg, Oral, Daily PRN, Viviann Pastor, MD   ibuprofen  (ADVIL ) tablet 600 mg, 600 mg, Oral, Q8H PRN, Viviann Pastor, MD, 600 mg at 05/20/24 1704   lisinopril  (ZESTRIL ) tablet 2.5 mg, 2.5 mg, Oral, Daily, Viviann Pastor, MD, 2.5 mg at 05/21/24 9056   lithium  carbonate (ESKALITH ) ER tablet 450 mg, 450 mg, Oral, QHS, Viviann Pastor, MD, 450 mg at 05/21/24 0011   melatonin tablet 5 mg, 5 mg, Oral, QHS, Viviann Pastor, MD, 5 mg at 05/21/24 0011   metFORMIN  (GLUCOPHAGE ) tablet 1,000 mg, 1,000 mg, Oral, BID WC, Viviann Pastor, MD, 1,000 mg at 05/21/24 9167   ondansetron  (ZOFRAN ) tablet 4 mg, 4 mg, Oral, Q8H PRN, Viviann Pastor, MD   propranolol  (INDERAL ) tablet 20 mg, 20 mg, Oral, QHS, Viviann Pastor, MD, 20 mg at 05/21/24 0012   risperiDONE  (RISPERDAL ) tablet 1 mg, 1 mg, Oral, BID, Viviann Pastor, MD, 1 mg at 05/21/24 9056   simvastatin  (ZOCOR ) tablet 40 mg,  40 mg, Oral, q1800, Viviann Pastor, MD   traZODone  (DESYREL ) tablet 100 mg, 100 mg, Oral, QHS, Viviann Pastor, MD, 100 mg at 05/21/24 0011  Current Outpatient Medications:    albuterol  (VENTOLIN  HFA) 108 (90 Base) MCG/ACT inhaler, Inhale 2 puffs into the lungs every 6 (six) hours as needed for wheezing or shortness of breath., Disp: , Rfl:    clonazePAM  (KLONOPIN ) 2 MG tablet, Take 1 tablet (2 mg total) by mouth 2 (two) times daily for 15 days., Disp: 30 tablet, Rfl: 0   cloZAPine  (CLOZARIL ) 50 MG tablet, Take 5 tablets (250 mg total) by mouth at bedtime., Disp: 150 tablet, Rfl: 0   ELIQUIS  2.5 MG TABS tablet, Take 2.5 mg by mouth 2 (two) times daily., Disp: , Rfl:    fluticasone -salmeterol (ADVAIR) 250-50 MCG/ACT AEPB, Inhale 1 puff into the lungs 2 (two) times daily., Disp: 60 each, Rfl: 1   hydrOXYzine  (ATARAX ) 25 MG tablet, Take 1 tablet (25 mg total) by mouth daily as needed  for anxiety., Disp: 30 tablet, Rfl: 0   lisinopril  (ZESTRIL ) 2.5 MG tablet, Take 1 tablet (2.5 mg total) by mouth daily., Disp: 30 tablet, Rfl: 1   lithium  carbonate (ESKALITH ) 450 MG ER tablet, Take 1 tablet (450 mg total) by mouth at bedtime., Disp: 30 tablet, Rfl: 1   melatonin 5 MG TABS, Take 5 mg by mouth at bedtime., Disp: , Rfl:    metFORMIN  (GLUCOPHAGE ) 1000 MG tablet, Take 1 tablet (1,000 mg total) by mouth 2 (two) times daily with a meal., Disp: 60 tablet, Rfl: 1   propranolol  (INDERAL ) 20 MG tablet, Take 1 tablet (20 mg total) by mouth at bedtime., Disp: 30 tablet, Rfl: 1   risperiDONE  (RISPERDAL ) 1 MG tablet, Take 1 mg by mouth 2 (two) times daily., Disp: , Rfl:    simvastatin  (ZOCOR ) 40 MG tablet, Take 1 tablet (40 mg total) by mouth daily at 6 PM., Disp: 30 tablet, Rfl: 1   traZODone  (DESYREL ) 100 MG tablet, Take 1 tablet (100 mg total) by mouth at bedtime., Disp: 30 tablet, Rfl: 1  Allergies: Allergies  Allergen Reactions   Penicillins Other (See Comments)    Reaction: lockjaw as teen Tolerated Augmentin /Unasyn  06/09/22      Zelda Sharps, NP This note was created using Dragon dictation software. Please excuse any inadvertent transcription errors. Case was discussed with supervising physician Dr. [name] who is agreeable with current plan.

## 2024-05-21 NOTE — BH Assessment (Signed)
 Psych Team met with patient for reassessment. Patient reports having increased anxiety attacks. Patient states his meds are not working. Patient presents slightly irritable. Patient says he has tried explaining this to staff at his group home but they will not listen. Patient endorsing suicidal ideations and visual hallucinations. I see faces. Patient denies current HI/AH.   Per Zelda, NP, patient is recommended for inpatient psychiatric admission.

## 2024-05-22 DIAGNOSIS — F209 Schizophrenia, unspecified: Secondary | ICD-10-CM | POA: Diagnosis not present

## 2024-05-22 LAB — LIPID PANEL
Cholesterol: 160 mg/dL (ref 0–200)
HDL: 36 mg/dL — ABNORMAL LOW (ref >40)
LDL Cholesterol: 62 mg/dL (ref 0–99)
Total CHOL/HDL Ratio: 4.4 ratio
Triglycerides: 310 mg/dL — ABNORMAL HIGH (ref <150)
VLDL: 62 mg/dL — ABNORMAL HIGH (ref 0–40)

## 2024-05-22 NOTE — Plan of Care (Signed)
   Problem: Education: Goal: Emotional status will improve Outcome: Not Progressing Goal: Mental status will improve Outcome: Not Progressing Goal: Verbalization of understanding the information provided will improve Outcome: Not Progressing

## 2024-05-22 NOTE — BH IP Treatment Plan (Signed)
 Interdisciplinary Treatment and Diagnostic Plan Update  05/22/2024 Time of Session: 10:20 Lance [name] MRN: [id number]  Principal Diagnosis: Schizophrenia [facility])  Secondary Diagnoses: Principal Problem:   Schizophrenia (HCC)   Current Medications:  Current Facility-Administered Medications  Medication Dose Route Frequency Provider Last Rate Last Admin   acetaminophen  (TYLENOL ) tablet 650 mg  650 mg Oral Q6H PRN [name], [name], NP       albuterol  (VENTOLIN  HFA) 108 (90 Base) MCG/ACT inhaler 2 puff  2 puff Inhalation Q6H PRN [name], [name], NP       alum & mag hydroxide-simeth (MAALOX/MYLANTA) 200-200-20 MG/5ML suspension 30 mL  30 mL Oral Q4H PRN [name], [name], NP       apixaban  (ELIQUIS ) tablet 2.5 mg  2.5 mg Oral BID [name], [name], NP   2.5 mg at 05/22/24 0809   clonazePAM  (KLONOPIN ) tablet 2 mg  2 mg Oral BID [name], [name], NP   2 mg at 05/22/24 0809   cloZAPine  (CLOZARIL ) tablet 250 mg  250 mg Oral QHS [name], [name], NP   250 mg at 05/21/24 2300   haloperidol  (HALDOL ) tablet 5 mg  5 mg Oral TID PRN [name], [name], NP       And   diphenhydrAMINE  (BENADRYL ) capsule 50 mg  50 mg Oral TID PRN [name], [name], NP       haloperidol  lactate (HALDOL ) injection 5 mg  5 mg Intramuscular TID PRN [name], [name], NP   5 mg at 05/21/24 1541   And   diphenhydrAMINE  (BENADRYL ) injection 50 mg  50 mg Intramuscular TID PRN [name], [name], NP       And   LORazepam  (ATIVAN ) injection 2 mg  2 mg Intramuscular TID PRN [name], [name], NP       haloperidol  lactate (HALDOL ) injection 10 mg  10 mg Intramuscular TID PRN [name], [name], NP       And   diphenhydrAMINE  (BENADRYL ) injection 50 mg  50 mg Intramuscular TID PRN [name], [name], NP   50 mg at 05/21/24 1541   And   LORazepam  (ATIVAN ) injection 2 mg  2 mg Intramuscular TID PRN [name], [name], NP   2 mg at 05/21/24 1540   fluticasone  furoate-vilanterol (BREO ELLIPTA ) 200-25 MCG/ACT 1 puff  1 puff Inhalation Daily [name], NP   1 puff at  05/21/24 1531   hydrOXYzine  (ATARAX ) tablet 25 mg  25 mg Oral Daily PRN [name], [name], NP   25 mg at 05/21/24 1531   lisinopril  (ZESTRIL ) tablet 2.5 mg  2.5 mg Oral Daily [name], [name], NP   2.5 mg at 05/22/24 0809   lithium  carbonate (ESKALITH ) ER tablet 450 mg  450 mg Oral QHS [name], [name], NP       magnesium  hydroxide (MILK OF MAGNESIA) suspension 30 mL  30 mL Oral Daily PRN [name], [name], NP       melatonin tablet 5 mg  5 mg Oral QHS [name], [name], NP       metFORMIN  (GLUCOPHAGE ) tablet 1,000 mg  1,000 mg Oral BID WC [name], [name], NP   1,000 mg at 05/22/24 0809   propranolol  (INDERAL ) tablet 20 mg  20 mg Oral QHS [name], [name], NP       risperiDONE  (RISPERDAL ) tablet 1 mg  1 mg Oral BID [name], [name], NP   1 mg at 05/22/24 0809   simvastatin  (ZOCOR ) tablet 40 mg  40  mg Oral q1800 [name], [name], MD   40 mg at 05/21/24 1823   traZODone  (DESYREL ) tablet 100 mg  100 mg Oral QHS [name], [name], NP   100 mg at 05/21/24 2120   PTA Medications: Medications Prior to Admission  Medication Sig Dispense Refill Last Dose/Taking   albuterol  (VENTOLIN  HFA) 108 (90 Base) MCG/ACT inhaler Inhale 2 puffs into the lungs every 6 (six) hours as needed for wheezing or shortness of breath.      clonazePAM  (KLONOPIN ) 2 MG tablet Take 1 tablet (2 mg total) by mouth 2 (two) times daily for 15 days. 30 tablet 0    cloZAPine  (CLOZARIL ) 50 MG tablet Take 5 tablets (250 mg total) by mouth at bedtime. 150 tablet 0    ELIQUIS  2.5 MG TABS tablet Take 2.5 mg by mouth 2 (two) times daily.      fluticasone -salmeterol (ADVAIR) 250-50 MCG/ACT AEPB Inhale 1 puff into the lungs 2 (two) times daily. 60 each 1    hydrOXYzine  (ATARAX ) 25 MG tablet Take 1 tablet (25 mg total) by mouth daily as needed for anxiety. 30 tablet 0    lisinopril  (ZESTRIL ) 2.5 MG tablet Take 1 tablet (2.5 mg total) by mouth daily. 30 tablet 1    lithium  carbonate (ESKALITH ) 450 MG ER tablet Take 1 tablet (450 mg total) by mouth at bedtime. 30  tablet 1    melatonin 5 MG TABS Take 5 mg by mouth at bedtime.      metFORMIN  (GLUCOPHAGE ) 1000 MG tablet Take 1 tablet (1,000 mg total) by mouth 2 (two) times daily with a meal. 60 tablet 1    propranolol  (INDERAL ) 20 MG tablet Take 1 tablet (20 mg total) by mouth at bedtime. 30 tablet 1    risperiDONE  (RISPERDAL ) 1 MG tablet Take 1 mg by mouth 2 (two) times daily.      simvastatin  (ZOCOR ) 40 MG tablet Take 1 tablet (40 mg total) by mouth daily at 6 PM. 30 tablet 1    traZODone  (DESYREL ) 100 MG tablet Take 1 tablet (100 mg total) by mouth at bedtime. 30 tablet 1     Patient Stressors:    Patient Strengths:    Treatment Modalities: Medication Management, Group therapy, Case management,  1 to 1 session with clinician, Psychoeducation, Recreational therapy.   Physician Treatment Plan for Primary Diagnosis: Schizophrenia (HCC) Long Term Goal(s):     Short Term Goals:    Medication Management: Evaluate patient's response, side effects, and tolerance of medication regimen.  Therapeutic Interventions: 1 to 1 sessions, Unit Group sessions and Medication administration.  Evaluation of Outcomes: Not Met  Physician Treatment Plan for Secondary Diagnosis: Principal Problem:   Schizophrenia (HCC)  Long Term Goal(s):     Short Term Goals:       Medication Management: Evaluate patient's response, side effects, and tolerance of medication regimen.  Therapeutic Interventions: 1 to 1 sessions, Unit Group sessions and Medication administration.  Evaluation of Outcomes: Not Met   RN Treatment Plan for Primary Diagnosis: Schizophrenia (HCC) Long Term Goal(s): Knowledge of disease and therapeutic regimen to maintain health will improve  Short Term Goals: Ability to remain free from injury will improve, Ability to verbalize frustration and anger appropriately will improve, Ability to demonstrate self-control, Ability to participate in decision making will improve, Ability to verbalize feelings  will improve, Ability to disclose and discuss suicidal ideas, Ability to identify and develop effective coping behaviors will improve, and Compliance with prescribed medications will improve  Medication  Management: RN will administer medications as ordered by provider, will assess and evaluate patient's response and provide education to patient for prescribed medication. RN will report any adverse and/or side effects to prescribing provider.  Therapeutic Interventions: 1 on 1 counseling sessions, Psychoeducation, Medication administration, Evaluate responses to treatment, Monitor vital signs and CBGs as ordered, Perform/monitor CIWA, COWS, AIMS and Fall Risk screenings as ordered, Perform wound care treatments as ordered.  Evaluation of Outcomes: Not Met   LCSW Treatment Plan for Primary Diagnosis: Schizophrenia (HCC) Long Term Goal(s): Safe transition to appropriate next level of care at discharge, Engage patient in therapeutic group addressing interpersonal concerns.  Short Term Goals: Engage patient in aftercare planning with referrals and resources, Increase social support, Increase ability to appropriately verbalize feelings, Increase emotional regulation, Facilitate acceptance of mental health diagnosis and concerns, Identify triggers associated with mental health/substance abuse issues, and Increase skills for wellness and recovery  Therapeutic Interventions: Assess for all discharge needs, 1 to 1 time with Social worker, Explore available resources and support systems, Assess for adequacy in community support network, Educate family and significant other(s) on suicide prevention, Complete Psychosocial Assessment, Interpersonal group therapy.  Evaluation of Outcomes: Not Met   Progress in Treatment: Attending groups: No. Participating in groups: No. Taking medication as prescribed: Yes. Toleration medication: Yes. Family/Significant other contact made: No, will contact:  guardian,  [name].  Patient understands diagnosis: Yes. Discussing patient identified problems/goals with staff: Yes. Medical problems stabilized or resolved: Yes. Denies suicidal/homicidal ideation: Yes. Issues/concerns per patient self-inventory: No. Other: none.  New problem(s) identified: No, Describe:  none identified  New Short Term/Long Term Goal(s): elimination of symptoms of psychosis, medication management for mood stabilization; elimination of SI thoughts; development of comprehensive mental wellness plan.  Patient Goals:  Pt declined to participate in treatment team meeting despite personal invitation by staff.   Discharge Plan or Barriers: CSW will assist pt with development of an appropriate aftercare/discharge plan.   Reason for Continuation of Hospitalization: Hallucinations Medication stabilization Suicidal ideation  Estimated Length of Stay: 1-7 days  Last 3 [place] Suicide Severity Risk Score: Flowsheet Row Admission (Current) from 05/21/2024 in [facility]IENT [facility] ED from 05/20/2024 in [facility] Emergency Department at [facility] ED from 05/19/2024 in [facility] Emergency Department at [facility]  C-SSRS RISK CATEGORY High Risk No Risk High Risk    Last PHQ 2/9 Scores:    03/20/2021   11:14 AM 03/20/2021   11:04 AM  Depression screen PHQ 2/9  Decreased Interest 0 0  Down, Depressed, Hopeless 0 0  PHQ - 2 Score 0 0    Scribe for Treatment Team: Nadara JONELLE Fam, LCSW 05/22/2024 11:29 AM

## 2024-05-22 NOTE — [facility] Suicide Risk Assessment (Signed)
 [facility] Admission Suicide Risk Assessment   Nursing information obtained from:  Patient Demographic factors:  Male, Caucasian Current Mental Status:  Suicidal ideation indicated by patient Loss Factors:  NA Historical Factors:  Family history of mental illness or substance abuse Risk Reduction Factors:  NA  Total Time spent with patient: 30 minutes Principal Problem: Schizophrenia (HCC) Diagnosis:  Principal Problem:   Schizophrenia (HCC)  Subjective Data: This is a 48 year old male who presented with suicidal ideation, and auditory and visual hallucinations. Past psychiatric history is significant for schizophrenia. Current psychiatric medications include Clozaril , clonazepam , risperidone , lithium . Patient is admitted to the adult inpatient unit with every 15-minute safety monitoring. Multidisciplinary team approach is offered. Medication management, group/milieu therapy is offered.   Continued Clinical Symptoms:  Alcohol Use Disorder Identification Test Final Score (AUDIT): 0 The Alcohol Use Disorders Identification Test, Guidelines for Use in Primary Care, Second Edition.  World Science Writer [facility]). Score between 0-7:  no or low risk or alcohol related problems. Score between 8-15:  moderate risk of alcohol related problems. Score between 16-19:  high risk of alcohol related problems. Score 20 or above:  warrants further diagnostic evaluation for alcohol dependence and treatment.   CLINICAL FACTORS:   Schizophrenia:   Depressive state, command hallucinations    Musculoskeletal: Strength & Muscle Tone: within normal limits Gait & Station: normal Patient leans: N/A  Psychiatric Specialty Exam:  Presentation  General Appearance:  Disheveled  Eye Contact: Poor  Speech: Garbled  Speech Volume: Normal  Handedness:No data recorded  Mood and Affect  Mood: Dysphoric  Affect: Depressed   Thought Process  Thought Processes: Coherent  Descriptions of  Associations:Intact  Orientation:Full (Time, Place and Person)  Thought Content:WDL  History of Schizophrenia/Schizoaffective disorder:Yes  Duration of Psychotic Symptoms:Greater than six months  Hallucinations:Hallucinations: Auditory; Command  Ideas of Reference:None  Suicidal Thoughts:Suicidal Thoughts: Yes, Active SI Active Intent and/or Plan: Without Intent; With Plan  Homicidal Thoughts:Homicidal Thoughts: No   Sensorium  Memory: Immediate Fair; Recent Fair  Judgment: Poor  Insight: Poor   Executive Functions  Concentration: Fair  Attention Span: Fair  Recall: [employer] of Knowledge: Fair  Language: Fair   Psychomotor Activity  Psychomotor Activity: Psychomotor Activity: Normal   Assets  Assets: Manufacturing Systems Engineer; Desire for Improvement   Sleep  Sleep: Sleep: Fair    Physical Exam: Physical Exam ROS Blood pressure 117/81, pulse 81, temperature 98 F (36.7 C), resp. rate 18, height 5' 10 (1.778 m), weight 86.3 kg, SpO2 99%. Body mass index is 27.3 kg/m.   COGNITIVE FEATURES THAT CONTRIBUTE TO RISK:  None    SUICIDE RISK:   Severe:  Frequent, intense, and enduring suicidal ideation, specific plan, no subjective intent, but some objective markers of intent (i.e., choice of lethal method), the method is accessible, evidence of impaired self-control, severe dysphoria/symptomatology, multiple risk factors present, and few if any protective factors, particularly a lack of social support.  PLAN OF CARE: Patient is admitted to the adult inpatient unit with every 15-minute safety monitoring. Multidisciplinary team approach is offered. Medication management, group/milieu therapy is offered.   I certify that inpatient services furnished can reasonably be expected to improve the patient's condition.   Shantel Wesely LITTIE Lukes, PA-C 05/22/2024, 5:20 PM

## 2024-05-22 NOTE — Plan of Care (Signed)
  Problem: Education: Goal: Knowledge of [facility] Education information/materials will improve Outcome: Progressing   Problem: Education: Goal: Emotional status will improve Outcome: Progressing   Problem: Education: Goal: Mental status will improve Outcome: Progressing   

## 2024-05-22 NOTE — Progress Notes (Signed)
   05/22/24 0809  Psych Admission Type (Psych Patients Only)  Admission Status Voluntary  Psychosocial Assessment  Patient Complaints Irritability  Eye Contact Brief  Facial Expression Flat  Affect Anxious;Preoccupied;Irritable  Speech Argumentative  Interaction No initiation;Minimal  Motor Activity Slow  Appearance/Hygiene Bizarre  Behavior Characteristics Irritable  Mood Anxious;Irritable  Thought Process  Coherency WDL  Content WDL  Delusions None reported or observed  Perception Hallucinations  Hallucination Auditory  Judgment Impaired  Confusion None  Danger to Self  Current suicidal ideation? Denies  Agreement Not to Harm Self Yes  Description of Agreement Verbal  Danger to Others  Danger to Others None reported or observed

## 2024-05-22 NOTE — Group Note (Signed)
 [facility] LCSW Group Therapy Note   Group Date: 05/22/2024 Start Time: 1300 End Time: 1400   Type of Therapy/Topic:  Group Therapy:  Balance in Life  Participation Level:  Did Not Attend   Description of Group:    This group will address the concept of balance and how it feels and looks when one is unbalanced. Patients will be encouraged to process areas in their lives that are out of balance, and identify reasons for remaining unbalanced. Facilitators will guide patients utilizing problem- solving interventions to address and correct the stressor making their life unbalanced. Understanding and applying boundaries will be explored and addressed for obtaining  and maintaining a balanced life. Patients will be encouraged to explore ways to assertively make their unbalanced needs known to significant others in their lives, using other group members and facilitator for support and feedback.  Therapeutic Goals: Patient will identify two or more emotions or situations they have that consume much of in their lives. Patient will identify signs/triggers that life has become out of balance:  Patient will identify two ways to set boundaries in order to achieve balance in their lives:  Patient will demonstrate ability to communicate their needs through discussion and/or role plays  Summary of Patient Progress:    Patient did not attend.     Therapeutic Modalities:   Cognitive Behavioral Therapy Solution-Focused Therapy Assertiveness Training   Alveta CHRISTELLA Kerns, LCSW

## 2024-05-22 NOTE — Progress Notes (Signed)
   05/21/24 2100  Psych Admission Type (Psych Patients Only)  Admission Status Voluntary  Psychosocial Assessment  Patient Complaints Anxiety  Eye Contact Brief  Facial Expression Flat  Affect Anxious;Preoccupied  Speech Argumentative  Interaction Assertive  Motor Activity Slow  Appearance/Hygiene In scrubs  Behavior Characteristics Appropriate to situation;Anxious;Agitated  Mood Anxious;Irritable  Thought Process  Coherency WDL  Content WDL  Delusions None reported or observed  Perception Hallucinations  Hallucination Auditory  Judgment Impaired  Confusion WDL  Danger to Self  Current suicidal ideation? Active  Self-Injurious Behavior Some self-injurious ideation observed or expressed.  No lethal plan expressed   Agreement Not to Harm Self No  Danger to Others  Danger to Others None reported or observed   Patient denies SI/HI but endorses AVH, his thoughts are disorganized appears responding to internal stimuli. Patient was offered emotional support on 1:1 for safety at this time. 15 minutes safety checks maintained.

## 2024-05-22 NOTE — Group Note (Signed)
 Date:  05/22/2024 Time:  8:44 PM  Group Topic/Focus:  Orientation:   The focus of this group is to educate the patient on the purpose and policies of crisis stabilization and provide a format to answer questions about their admission.  The group details unit policies and expectations of patients while admitted.    Participation Level:  Did Not Attend  Participation Quality:  none  Affect:  none  Cognitive:  none  Insight: None  Engagement in Group:  none  Modes of Intervention:  none  Additional Comments:  none   Hussein Macdougal 05/22/2024, 8:44 PM

## 2024-05-22 NOTE — Group Note (Signed)
 Date:  05/22/2024 Time:  3:34 PM  Group Topic/Focus:  Crisis Planning:   The purpose of this group is to help patients create a crisis plan for use upon discharge or in the future, as needed.    Participation Level:  Did Not Attend   [name] Geneieve Duell 05/22/2024, 3:34 PM

## 2024-05-22 NOTE — Group Note (Signed)
 Recreation Therapy Group Note   Group Topic:Other  Group Date: 05/22/2024 Start Time: 1515 End Time: 1605 Facilitators: Celestia Jeoffrey FORBES ARTICE, CTRS Location: Craft Room  Activity Description/Intervention: Therapeutic Drumming. Patients with peers and staff were given the opportunity to engage in a leader facilitated [facility] Group Empowerment Drumming Circle with staff from the [employer], in partnership with [employer]. Teaching laboratory technician and trained [place], Norleen Mon leading with LRT observing and documenting intervention and pt response. This evidenced-based practice targets 7 areas of health and wellbeing in the human experience including: stress-reduction, exercise, self-expression, camaraderie/support, nurturing, spirituality, and music-making (leisure).    Goal Area(s) Addresses:  Patient will engage in pro-social way in music group.  Patient will follow directions of drum leader on the first prompt. Patient will demonstrate no behavioral issues during group.  Patient will identify if a reduction in stress level occurs as a result of participation in therapeutic drum circle.     Affect/Mood: N/A   Participation Level: Did not attend    Clinical Observations/Individualized Feedback: Patient did not attend group.   Plan: Continue to engage patient in RT group sessions 2-3x/week.   Jeoffrey FORBES Celestia, LRT, CTRS 05/22/2024 5:14 PM

## 2024-05-22 NOTE — H&P (Signed)
 Psychiatric Admission Assessment Adult  Patient Identification: Lance [name] MRN:  [id number] Date of Evaluation:  05/22/2024 Chief Complaint:  Schizophrenia (HCC) [F20.9]   History of Present Illness:  Per psychiatric consult note 05/21/24: Lance [name] is a 48 y.o. male admitted: Presented to the EDfor 05/20/2024  3:15 PM for Patient arrived with IVC papers. Patient was transported to the crisis center by law enforcement after being found lying in front of tobacco store. Upon contact, he reported hearing voices. SABRA He carries the psychiatric diagnoses of Schizophrenia and has a past medical history of  Chart review significant for diabetes mellitus, hx of pulmonary embolus.    On current presentation, patient reported I am having a bad time.  Current assessment limited due to patient getting frustrated and stating I just cannot think right now.  Patient reporting ongoing negative thoughts that he is going to die and believes that it is going to happen soon.  Patient reported that these thoughts just started today.  Patient was recently seen in the emergency room yesterday for similar.  Today on exam, patient denied suicidal or homicidal thoughts.  Patient denied auditory hallucinations, he described what was going along currently as negative thoughts.  Patient reported visual hallucinations today and reported he can see many faces.  Patient did not appear to be responding to internal stimuli on current exam.  Patient reported he believes his medications are causing increased anxiety.  Patient was unsure if any medication changes had been made recently.  Patient was unsure of current outpatient psychiatric provider. Unsure of current medication compliance.  Patient got frustrated and irritated with continued questioning by psychiatry team. It is noted in chart that patient has legal guardian listed as Lance [name].  Psychiatry team attempted to contact Vivian, HIPAA compliant voicemail  left at this time requesting return call.  Patient reported that he walked away from group home last night and had been walking around in the streets all night and laid in front of the tobacco store due to feeling mentally and physically unwell. Given the patient's recent admission to the inpatient unit and history of frequent emergency room visits, as well as current concerning behaviors--including walking away from the group home and lying in front of a tobacco store--it is recommended that the patient be observed overnight in the emergency room. This will allow for continued monitoring of the patient's behavior and assessment of any potential underlying medical or psychological concerns that may require intervention.   05/21/2024: The patient was seen today on rounds and reported experiencing suicidal thoughts. When asked regarding the specifics of their plan, the patient became noticeably irritated, responding with a loud statement I know I am going to die, but did not provide further elaboration on intent or plan. The patient was significantly irritable during the interaction with the psychiatry team.  The patient endorsed high levels of anxiety and, when asked about safety planning, patient responded with I will probably just kill myself, which raises significant concerns regarding current safety. The patient is currently unable to contract for safety, indicating a current high risk of self-harm.   Tthe patient denied homicidal ideations and auditory hallucinations. However, when asked about visual hallucinations, the patient became upset and reiterated, I already told you yesterday I see faces and refused to discuss these symptoms further.  Patient is currently IVC, patient will be recommended for inpatient psychiatric admission for further monitoring and stabilization.  Per progress note on 12/7 by admitting RN: Patient arrived to  the unit and is alert and oriented x 4. He was slightly  lethargic on admission. During his admission assessment, the patient did not contract for safety. He stated that he is actively suicidal and plans to kill himself while he is admitted at [facility]. He states that he is going to die anyway so why not just kill himself. He went on to say that he is hearing voices that are telling his that he is not worth anything and that he should kill himself. Provider was notified and patient was placed on 1:1 observation.    Patient was unsteady on his feet and required standby assistance from RN to ambulate to the day room. While in the day room, the patient became agitated and threw coffee across the room which resulted in a stained table and walls. PRN haldol , benadryl  and ativan  were administered to the patient via IM injection (see MAR). Patient was advised not to damage property on the unit moving forward to which he verbally agreed. Will continue 1:1.   On assessment today, patient is found resting in bed.  He is noted to be irritable and minimally engaged in interview.  He endorses current depressive symptoms including depressed mood, low energy, low motivation, feelings of hopelessness and worthlessness, and sleep disturbance.  He endorses current suicidal ideation with plan but declines to discuss plan. He denies current intent but is unable to contract for safety at this time.  He endorses auditory hallucinations including command hallucinations to hurt self and others.  Patient remains on one-to-one safety monitoring at this time.  Patient endorses anxiety, panic attacks, nightmares.  He endorses visual hallucinations involving seeing faces.  Patient reports currently living in a group home but states he does not like his group home but declines to discuss further.  He denies access to guns or other lethal weapons. Patient declines to participate further in interview.   Total Time spent with patient: 1 hour Sleep  Sleep:Sleep: Fair  Past Psychiatric History:  Schizoaffective disorder Psychiatric History:  Information collected from the patient and chart review.  Prev Dx/Sx: Schizoaffective disorder Current Psych Provider: Patient was unsure, but is established with outpatient provider Home Meds (current): Clozaril , Klonopin , lithium , risperidone , trazodone  Previous Med Trials: Patient unsure Therapy: Patient unsure  Prior Psych Hospitalization: Yes, most recently November 2025 Prior Self Harm: Suicide attempt at age 67 by overdose Prior Violence: Previous incarceration for attempted murder  Family Psych History: Personality disorder in mother, per chart review Family Hx suicide: Uncle, per chart review  Social History:  Educational Hx: Ninth grade Occupational Hx: Unemployed Legal Hx: previous incarceration for attempted murder Living Situation: Lives in group home Spiritual Hx: Unknown  Access to weapons/lethal means: Denies    Substance History Alcohol: Endorses occasional use  Type of alcohol beer  Last Drink unknown Number of drinks per day unknown History of alcohol withdrawal seizures unknown History of DT's unknown Tobacco: Cigarettes and vape Illicit drugs: Denies Prescription drug abuse: Denies Rehab hx: Unknown Is the patient at risk to self? Yes.    Has the patient been a risk to self in the past 6 months? Yes.    Has the patient been a risk to self within the distant past? No.  Is the patient a risk to others? Yes Has the patient been a risk to others in the past 6 months? No.  Has the patient been a risk to others within the distant past? No.   Columbia Scale:  Flowsheet Row Admission (Current)  from 05/21/2024 in [facility] INPATIENT BE[facility]INE ED from 05/20/2024 in [facility] Emergency Department at [facility] ED from 05/19/2024 in [facility] Emergency Department at [facility]  C-SSRS RISK CATEGORY High Risk No Risk High Risk     Past Medical History:  Past Medical History:  Diagnosis Date    Depression    Diabetes mellitus without complication (HCC)    Hyperlipidemia    Hypertension    Lupus anticoagulant disorder 06/14/2012   as per Dr.pandit's note in dec 2013.   PE (pulmonary thromboembolism) (HCC)    Schizo affective schizophrenia (HCC)    Supratherapeutic INR 11/19/2016   History reviewed. No pertinent surgical history. Family History:  Family History  Problem Relation Age of Onset   CAD Mother    Rheum arthritis Mother    Healthy Father    CAD Sister    Obesity Sister     Social History:  Social History   Substance and Sexual Activity  Alcohol Use Not Currently   Comment: occassionally     Social History   Substance and Sexual Activity  Drug Use Yes   Types: Marijuana   Comment: Patient reports occasionally      Allergies:   Allergies  Allergen Reactions   Penicillins Other (See Comments)    Reaction: lockjaw as teen Tolerated Augmentin /Unasyn  06/09/22     Lab Results:  Results for orders placed or performed during the hospital encounter of 05/21/24 (from the past 48 hours)  Lipid panel     Status: Abnormal   Collection Time: 05/22/24  8:44 PM  Result Value Ref Range   Cholesterol 160 0 - 200 mg/dL    Comment:        ATP III CLASSIFICATION:  <200     mg/dL   Desirable  799-760  mg/dL   Borderline High  >=759    mg/dL   High           Triglycerides 310 (H) <150 mg/dL   HDL 36 (L) >59 mg/dL   Total CHOL/HDL Ratio 4.4 RATIO   VLDL 62 (H) 0 - 40 mg/dL   LDL Cholesterol 62 0 - 99 mg/dL    Comment:        Total Cholesterol/HDL:CHD Risk Coronary Heart Disease Risk Table                     Men   Women  1/2 Average Risk   3.4   3.3  Average Risk       5.0   4.4  2 X Average Risk   9.6   7.1  3 X Average Risk  23.4   11.0        Use the calculated Patient Ratio above and the CHD Risk Table to determine the patient's CHD Risk.        ATP III CLASSIFICATION (LDL):  <100     mg/dL   Optimal  899-870  mg/dL   Near or Above                     Optimal  130-159  mg/dL   Borderline  839-810  mg/dL   High  >809     mg/dL   Very High Performed at [facility], [address] [place], [place] [address]     Blood Alcohol level:  Lab Results  Component Value Date   [facility] <15 05/20/2024   [facility] <15 05/19/2024    Metabolic Disorder  Labs:  Lab Results  Component Value Date   HGBA1C 4.9 09/10/2023   MPG 93.93 09/10/2023   MPG 93.93 04/26/2022   Lab Results  Component Value Date   PROLACTIN 2.0 (L) 08/31/2016   PROLACTIN 4.8 05/22/2016   Lab Results  Component Value Date   CHOL 160 05/22/2024   TRIG 310 (H) 05/22/2024   HDL 36 (L) 05/22/2024   CHOLHDL 4.4 05/22/2024   VLDL 62 (H) 05/22/2024   LDLCALC 62 05/22/2024   LDLCALC 68 05/02/2024    Current Medications: Current Facility-Administered Medications  Medication Dose Route Frequency Provider Last Rate Last Admin   acetaminophen  (TYLENOL ) tablet 650 mg  650 mg Oral Q6H PRN [name], [name], NP   650 mg at 05/22/24 1203   albuterol  (VENTOLIN  HFA) 108 (90 Base) MCG/ACT inhaler 2 puff  2 puff Inhalation Q6H PRN [name], [name], NP       alum & mag hydroxide-simeth (MAALOX/MYLANTA) 200-200-20 MG/5ML suspension 30 mL  30 mL Oral Q4H PRN [name], [name], NP       apixaban  (ELIQUIS ) tablet 2.5 mg  2.5 mg Oral BID [name], [name], NP   2.5 mg at 05/22/24 1702   clonazePAM  (KLONOPIN ) tablet 2 mg  2 mg Oral BID [name], [name], NP   2 mg at 05/22/24 1702   cloZAPine  (CLOZARIL ) tablet 250 mg  250 mg Oral QHS [name], [name], NP   250 mg at 05/21/24 2300   haloperidol  (HALDOL ) tablet 5 mg  5 mg Oral TID PRN [name], [name], NP   5 mg at 05/22/24 1424   And   diphenhydrAMINE  (BENADRYL ) capsule 50 mg  50 mg Oral TID PRN [name], [name], NP   50 mg at 05/22/24 1424   haloperidol  lactate (HALDOL ) injection 5 mg  5 mg Intramuscular TID PRN [name], [name], NP   5 mg at 05/21/24 1541   And   diphenhydrAMINE  (BENADRYL ) injection 50 mg  50 mg Intramuscular TID PRN [name], Annie  B, NP       And   LORazepam  (ATIVAN ) injection 2 mg  2 mg Intramuscular TID PRN [name], [name], NP       haloperidol  lactate (HALDOL ) injection 10 mg  10 mg Intramuscular TID PRN [name], [name], NP       And   diphenhydrAMINE  (BENADRYL ) injection 50 mg  50 mg Intramuscular TID PRN [name], [name], NP   50 mg at 05/21/24 1541   And   LORazepam  (ATIVAN ) injection 2 mg  2 mg Intramuscular TID PRN [name], [name], NP   2 mg at 05/21/24 1540   fluticasone  furoate-vilanterol (BREO ELLIPTA ) 200-25 MCG/ACT 1 puff  1 puff Inhalation Daily [name], NP   1 puff at 05/21/24 1531   hydrOXYzine  (ATARAX ) tablet 25 mg  25 mg Oral Daily PRN [name], [name], NP   25 mg at 05/21/24 1531   lisinopril  (ZESTRIL ) tablet 2.5 mg  2.5 mg Oral Daily [name], [name], NP   2.5 mg at 05/22/24 0809   lithium  carbonate (ESKALITH ) ER tablet 450 mg  450 mg Oral QHS [name], [name], NP       magnesium  hydroxide (MILK OF MAGNESIA) suspension 30 mL  30 mL Oral Daily PRN [name], [name], NP       melatonin tablet 5 mg  5 mg Oral QHS [name], [name], NP       metFORMIN  (GLUCOPHAGE ) tablet 1,000 mg  1,000 mg  Oral BID WC [name], [name], NP   1,000 mg at 05/22/24 1702   propranolol  (INDERAL ) tablet 20 mg  20 mg Oral QHS [name], [name], NP       risperiDONE  (RISPERDAL ) tablet 1 mg  1 mg Oral BID [name], [name], NP   1 mg at 05/22/24 1702   simvastatin  (ZOCOR ) tablet 40 mg  40 mg Oral q1800 [name], [name], MD   40 mg at 05/22/24 1702   traZODone  (DESYREL ) tablet 100 mg  100 mg Oral QHS [name], [name], NP   100 mg at 05/21/24 2120   PTA Medications: Medications Prior to Admission  Medication Sig Dispense Refill Last Dose/Taking   albuterol  (VENTOLIN  HFA) 108 (90 Base) MCG/ACT inhaler Inhale 2 puffs into the lungs every 6 (six) hours as needed for wheezing or shortness of breath.      clonazePAM  (KLONOPIN ) 2 MG tablet Take 1 tablet (2 mg total) by mouth 2 (two) times daily for 15 days. 30 tablet 0    cloZAPine  (CLOZARIL ) 50 MG tablet  Take 5 tablets (250 mg total) by mouth at bedtime. 150 tablet 0    ELIQUIS  2.5 MG TABS tablet Take 2.5 mg by mouth 2 (two) times daily.      fluticasone -salmeterol (ADVAIR) 250-50 MCG/ACT AEPB Inhale 1 puff into the lungs 2 (two) times daily. 60 each 1    hydrOXYzine  (ATARAX ) 25 MG tablet Take 1 tablet (25 mg total) by mouth daily as needed for anxiety. 30 tablet 0    lisinopril  (ZESTRIL ) 2.5 MG tablet Take 1 tablet (2.5 mg total) by mouth daily. 30 tablet 1    lithium  carbonate (ESKALITH ) 450 MG ER tablet Take 1 tablet (450 mg total) by mouth at bedtime. 30 tablet 1    melatonin 5 MG TABS Take 5 mg by mouth at bedtime.      metFORMIN  (GLUCOPHAGE ) 1000 MG tablet Take 1 tablet (1,000 mg total) by mouth 2 (two) times daily with a meal. 60 tablet 1    propranolol  (INDERAL ) 20 MG tablet Take 1 tablet (20 mg total) by mouth at bedtime. 30 tablet 1    risperiDONE  (RISPERDAL ) 1 MG tablet Take 1 mg by mouth 2 (two) times daily.      simvastatin  (ZOCOR ) 40 MG tablet Take 1 tablet (40 mg total) by mouth daily at 6 PM. 30 tablet 1    traZODone  (DESYREL ) 100 MG tablet Take 1 tablet (100 mg total) by mouth at bedtime. 30 tablet 1     Psychiatric Specialty Exam:  Presentation  General Appearance:  Disheveled  Eye Contact: Poor  Speech: Garbled  Speech Volume: Normal    Mood and Affect  Mood: Dysphoric  Affect: Depressed   Thought Process  Thought Processes: Coherent  Descriptions of Associations:Intact  Orientation:Full (Time, Place and Person)  Thought Content:WDL  Hallucinations:Hallucinations: Auditory; Command  Ideas of Reference:None  Suicidal Thoughts:Suicidal Thoughts: Yes, Active SI Active Intent and/or Plan: Without Intent; With Plan  Homicidal Thoughts:Homicidal Thoughts: No   Sensorium  Memory: Immediate Fair; Recent Fair  Judgment: Poor  Insight: Poor   Executive Functions  Concentration: Fair  Attention Span: Fair  Recall: [employer] of  Knowledge: Fair  Language: Fair   Psychomotor Activity  Psychomotor Activity: Psychomotor Activity: Normal   Assets  Assets: Manufacturing Systems Engineer; Desire for Improvement    Musculoskeletal: Strength & Muscle Tone: within normal limits Gait & Station: normal  Physical Exam: Physical Exam Vitals and nursing note reviewed.  Constitutional:  Appearance: Normal appearance.  HENT:     Head: Atraumatic.     Nose: Nose normal.     Mouth/Throat:     Mouth: Mucous membranes are moist.  Eyes:     Conjunctiva/sclera: Conjunctivae normal.  Pulmonary:     Effort: Pulmonary effort is normal.  Neurological:     Mental Status: He is alert and oriented to person, place, and time.  Psychiatric:        Attention and Perception: Attention normal. He perceives auditory and visual hallucinations.        Mood and Affect: Mood is depressed. Affect is labile.        Speech: Speech is slurred.        Behavior: Behavior is actively hallucinating. Behavior is cooperative.        Thought Content: Thought content includes suicidal ideation. Thought content does not include homicidal ideation. Thought content includes suicidal plan. Thought content does not include homicidal plan.        Cognition and Memory: Cognition normal.        Judgment: Judgment is impulsive.    Review of Systems  Psychiatric/Behavioral:  Positive for depression, hallucinations and suicidal ideas. Negative for substance abuse. The patient is nervous/anxious and has insomnia.    Blood pressure 117/81, pulse 81, temperature 98 F (36.7 C), resp. rate 18, height 5' 10 (1.778 m), weight 86.3 kg, SpO2 99%. Body mass index is 27.3 kg/m.  Principal Diagnosis: Schizophrenia (HCC) Diagnosis:  Principal Problem:   Schizophrenia (HCC)   Clinical Decision Making:  Treatment Plan Summary:  Safety and Monitoring:             -- Voluntary admission to inpatient psychiatric unit for safety, stabilization and treatment              -- Daily contact with patient to assess and evaluate symptoms and progress in treatment             -- Patient's case to be discussed in multi-disciplinary team meeting             -- Observation Level: q15 minute checks             -- Vital signs:  q12 hours             -- Precautions: suicide, elopement, and assault   2. Psychiatric Diagnoses and Treatment:               Home medications continued:  Klonopin  2 mg twice daily Clozaril  250 mg daily at bedtime Lithium  ER 450 mg daily at bedtime Melatonin 5 mg daily at bedtime Risperidone  1 mg twice daily Trazodone  100 mg daily at bedtime   -- The risks/benefits/side-effects/alternatives to this medication were discussed in detail with the patient and time was given for questions. The patient consents to medication trial.                -- Metabolic profile and EKG monitoring obtained while on an atypical antipsychotic (BMI: Lipid Panel: HbgA1c: QTc:)              -- Encouraged patient to participate in unit milieu and in scheduled group therapies                            3. Medical Issues Being Addressed:  No acute concerns.   4. Discharge Planning:              --  Social work and case management to assist with discharge planning and identification of hospital follow-up needs prior to discharge             -- Estimated LOS: 5-7 days             -- Discharge Concerns: Need to establish a safety plan; Medication compliance and effectiveness             -- Discharge Goals: Return home with outpatient referrals follow ups  Physician Treatment Plan for Primary Diagnosis: Schizophrenia (HCC) Long Term Goal(s): Improvement in symptoms so as ready for discharge  Short Term Goals: Ability to identify changes in lifestyle to reduce recurrence of condition will improve, Ability to verbalize feelings will improve, Ability to identify and develop effective coping behaviors will improve, and Compliance with prescribed medications will  improve  Physician Treatment Plan for Secondary Diagnosis: Principal Problem:   Schizophrenia (HCC)  Long Term Goal(s): Improvement in symptoms so as ready for discharge  Short Term Goals: Ability to identify changes in lifestyle to reduce recurrence of condition will improve, Ability to demonstrate self-control will improve, Ability to identify and develop effective coping behaviors will improve, and Compliance with prescribed medications will improve  I certify that inpatient services furnished can reasonably be expected to improve the patient's condition.    Camelia LITTIE Lukes, PA-C 05/22/2024 5:26 PM

## 2024-05-22 NOTE — Group Note (Signed)
 Recreation Therapy Group Note   Group Topic:Coping Skills  Group Date: 05/22/2024 Start Time: 1040 End Time: 1120 Facilitators: Celestia Jeoffrey BRAVO, LRT, CTRS Location: Craft Room  Group Description: Mind Map.  Patient was provided a blank template of a diagram with 32 blank boxes in a tiered system, branching from the center (similar to a bubble chart). LRT directed patients to label the middle of the diagram Coping Skills. LRT and patients then came up with 8 different coping skills as examples. Pt were directed to record their coping skills in the 2nd tier boxes closest to the center.  Patients would then share their coping skills with the group as LRT wrote them out. LRT gave a handout of 99 different coping skills at the end of group.   Goal Area(s) Addressed: Patients will be able to define "coping skills". Patient will identify new coping skills.  Patient will increase communication.   Affect/Mood: N/A   Participation Level: Did not attend    Clinical Observations/Individualized Feedback: Patient did not attend group.   Plan: Continue to engage patient in RT group sessions 2-3x/week.   Jeoffrey BRAVO Celestia, LRT, CTRS 05/22/2024 11:34 AM

## 2024-05-23 ENCOUNTER — Other Ambulatory Visit (HOSPITAL_COMMUNITY): Payer: Self-pay

## 2024-05-23 DIAGNOSIS — F200 Paranoid schizophrenia: Principal | ICD-10-CM | POA: Insufficient documentation

## 2024-05-23 DIAGNOSIS — F209 Schizophrenia, unspecified: Secondary | ICD-10-CM | POA: Diagnosis not present

## 2024-05-23 LAB — BASIC METABOLIC PANEL WITH GFR
Anion gap: 14 (ref 5–15)
BUN: 26 mg/dL — ABNORMAL HIGH (ref 6–20)
CO2: 24 mmol/L (ref 22–32)
Calcium: 10 mg/dL (ref 8.9–10.3)
Chloride: 104 mmol/L (ref 98–111)
Creatinine, Ser: 1.13 mg/dL (ref 0.61–1.24)
GFR, Estimated: >60 mL/min (ref >60)
Glucose, Bld: 152 mg/dL — ABNORMAL HIGH (ref 70–99)
Potassium: 5.1 mmol/L (ref 3.5–5.1)
Sodium: 142 mmol/L (ref 135–145)

## 2024-05-23 LAB — HEMOGLOBIN A1C
Hgb A1c MFr Bld: 5.7 % — ABNORMAL HIGH (ref 4.8–5.6)
Mean Plasma Glucose: 116.89 mg/dL

## 2024-05-23 MED ORDER — GLUCERNA SHAKE PO LIQD
237.0000 mL | Freq: Three times a day (TID) | ORAL | Status: DC
  Administered 2024-05-23 – 2024-06-06 (×36): 237 mL via ORAL

## 2024-05-23 MED ORDER — CLONAZEPAM 1 MG PO TABS
1.0000 mg | ORAL_TABLET | Freq: Two times a day (BID) | ORAL | Status: DC
  Administered 2024-05-23 – 2024-05-24 (×3): 1 mg via ORAL
  Filled 2024-05-23 (×3): qty 1

## 2024-05-23 NOTE — Progress Notes (Signed)
   05/23/24 1100  Psych Admission Type (Psych Patients Only)  Admission Status Voluntary  Psychosocial Assessment  Patient Complaints Anhedonia  Eye Contact Brief  Facial Expression Flat  Affect Anxious  Speech Soft  Interaction Minimal  Motor Activity Slow  Appearance/Hygiene Disheveled  Behavior Characteristics Cooperative  Mood Anxious;Irritable  Aggressive Behavior  Effect No apparent injury  Thought Process  Coherency WDL  Content WDL  Delusions None reported or observed  Perception Hallucinations  Hallucination Auditory  Judgment Impaired  Confusion None  Danger to Self  Current suicidal ideation? Denies  Agreement Not to Harm Self Yes  Description of Agreement verbal  Danger to Others  Danger to Others None reported or observed   Patient had shower and ambulated with walker. Patient stated that he is feeling better but  not fully. Patient continue with 1:1 sitter for safety.

## 2024-05-23 NOTE — Progress Notes (Signed)
 Pt was ambulating to the bathroom assisted, pt slipped and was assisted to the ground, pt was not injured, pt did not hit his head.

## 2024-05-23 NOTE — Progress Notes (Signed)
   05/23/24 0100  Psych Admission Type (Psych Patients Only)  Admission Status Voluntary  Psychosocial Assessment  Patient Complaints Anxiety  Eye Contact Fair  Facial Expression Flat  Affect Anxious  Speech Soft  Interaction Minimal  Motor Activity Slow  Appearance/Hygiene Disheveled  Behavior Characteristics Cooperative  Mood Anxious  Aggressive Behavior  Effect No apparent injury  Thought Process  Coherency WDL  Content WDL  Delusions WDL  Perception Hallucinations  Hallucination Auditory  Judgment Impaired  Confusion WDL  Danger to Self  Current suicidal ideation? Denies  Danger to Others  Danger to Others None reported or observed

## 2024-05-23 NOTE — Group Note (Signed)
 [facility] LCSW Group Therapy Note   Group Date: 05/23/2024 Start Time: 1315 End Time: 1400  Type of Therapy/Topic:  Group Therapy:  Feelings about Diagnosis  Participation Level:  Did Not Attend   Description of Group:    This group will allow patients to explore their thoughts and feelings about diagnoses they have received. Patients will be guided to explore their level of understanding and acceptance of these diagnoses. Facilitator will encourage patients to process their thoughts and feelings about the reactions of others to their diagnosis, and will guide patients in identifying ways to discuss their diagnosis with significant others in their lives. This group will be process-oriented, with patients participating in exploration of their own experiences as well as giving and receiving support and challenge from other group members.   Therapeutic Goals: 1. Patient will demonstrate understanding of diagnosis as evidence by identifying two or more symptoms of the disorder:  2. Patient will be able to express two feelings regarding the diagnosis 3. Patient will demonstrate ability to communicate their needs through discussion and/or role plays  Summary of Patient Progress: Patient declined to attend group.     Therapeutic Modalities:   Cognitive Behavioral Therapy Brief Therapy Feelings Identification    Sherryle JINNY Margo, LCSW

## 2024-05-23 NOTE — Group Note (Signed)
 Recreation Therapy Group Note   Group Topic:Healthy Support Systems  Group Date: 05/23/2024 Start Time: 1000 End Time: 1040 Facilitators: Celestia Jeoffrey BRAVO, LRT, CTRS Location: Craft Room  Group Description: Straw Bridge. In groups or individually, patients were given 10 plastic drinking straws and an equal length of masking tape. Using the materials provided, patients were instructed to build a free-standing bridge-like structure to suspend an everyday item (ex: deck of cards) off the floor or table surface. All materials were required to be used in secondary school teacher. LRT facilitated post-activity discussion reviewing the importance of having strong and healthy support systems in our lives. LRT discussed how the people in our lives serve as the tape and the deck of cards we placed on top of our straw structure are the stressors we face in daily life. LRT and pts discussed what happens in our life when things get too heavy for us , and we don't have strong supports outside of the hospital. Pt shared 2 of their healthy supports in their life aloud in the group.   Goal Area(s) Addressed:  Patient will identify 2 healthy supports in their life. Patient will identify skills to successfully complete activity. Patient will identify correlation of this activity to life post-discharge.  Patient will build on frustration tolerance skills. Patient will increase team building and communication skills.    Affect/Mood: N/A   Participation Level: Did not attend    Clinical Observations/Individualized Feedback: Patient did not attend group.   Plan: Continue to engage patient in RT group sessions 2-3x/week.   Jeoffrey BRAVO Celestia, LRT, CTRS 05/23/2024 11:00 AM

## 2024-05-23 NOTE — Progress Notes (Signed)
Nursing 1:1 note D:Pt observed sleeping in bed with eyes closed. RR even and unlabored. No distress noted. A: 1:1 observation continues for safety  R: pt remains safe  

## 2024-05-23 NOTE — Progress Notes (Signed)
 [facility] MD Progress Note  05/23/2024 1:54 PM Lance [name]  MRN:  [id number]  Lance [name] is a 48 y.o. male admitted: Presented to the EDfor 05/20/2024  3:15 PM for Patient arrived with IVC papers. Patient was transported to the crisis center by law enforcement after being found lying in front of tobacco store. Upon contact, he reported hearing voices. SABRA He carries the psychiatric diagnoses of Schizophrenia and has a past medical history of  Chart review significant for diabetes mellitus, hx of pulmonary embolus. Patient is admitted to adult psych unit with Q15 min safety monitoring. Multidisciplinary team approach is offered. Medication management; group/milieu therapy is offered.  Subjective:  Chart reviewed, case discussed in multidisciplinary meeting, patient seen during rounds.   Patient is noted to be resting in bed with one-to-one sitter in the room.  Patient is noted to be very drowsy.  He was able to wake up for the verbal commands.  He reports that he is feeling out of it.  He reports having abdominal pain on the left side.  Being oncological patient encouraged to inform the staff if he is constipated.  Patient reports having bowel movements.  Patient denies having any urinary symptoms.  Patient vitals are stable except for low blood pressure.  Patient received increased dose of Klonopin  during this admission 2 mg twice daily.  Patient reports ongoing thoughts of suicide but denies having any active plan.  Patient denies auditory/visual hallucinations.  Discussed the plan to reduce the dosing of Klonopin , remove melatonin and discontinue propranolol  due to low blood pressure.  Past Psychiatric History: see h&P Family History:  Family History  Problem Relation Age of Onset   CAD Mother    Rheum arthritis Mother    Healthy Father    CAD Sister    Obesity Sister    Social History:  Social History   Substance and Sexual Activity  Alcohol Use Not Currently   Comment: occassionally      Social History   Substance and Sexual Activity  Drug Use Yes   Types: Marijuana   Comment: Patient reports occasionally    Social History   Socioeconomic History   Marital status: Single    Spouse name: Not on file   Number of children: Not on file   Years of education: Not on file   Highest education level: Not on file  Occupational History   Not on file  Tobacco Use   Smoking status: Every Day    Current packs/day: 1.00    Average packs/day: 1 pack/day for 23.0 years (23.0 ttl pk-yrs)    Types: Cigarettes   Smokeless tobacco: Never   Tobacco comments:    will provide material  Vaping Use   Vaping status: Every Day  Substance and Sexual Activity   Alcohol use: Not Currently    Comment: occassionally   Drug use: Yes    Types: Marijuana    Comment: Patient reports occasionally   Sexual activity: Not Currently    Birth control/protection: None    Comment: occasional marijuana- none recently  Other Topics Concern   Not on file  Social History Narrative   From a group home in [place]   Social Drivers of Health   Financial Resource Strain: Low Risk  (03/20/2021)   Overall Financial Resource Strain (CARDIA)    Difficulty of Paying Living Expenses: Not very hard  Food Insecurity: No Food Insecurity (05/21/2024)   Hunger Vital Sign    Worried About Running Out of  Food in the Last Year: Never true    Ran Out of Food in the Last Year: Never true  Transportation Needs: No Transportation Needs (05/21/2024)   PRAPARE - Administrator, Civil Service (Medical): No    Lack of Transportation (Non-Medical): No  Physical Activity: Insufficiently Active (03/20/2021)   Exercise Vital Sign    Days of Exercise per Week: 3 days    Minutes of Exercise per Session: 20 min  Stress: No Stress Concern Present (03/20/2021)   [employer] of Occupational Health - Occupational Stress Questionnaire    Feeling of Stress : Only a little  Social Connections: Socially  Isolated (03/20/2021)   Social Connection and Isolation Panel    Frequency of Communication with Friends and Family: More than three times a week    Frequency of Social Gatherings with Friends and Family: More than three times a week    Attends Religious Services: Never    Database Administrator or Organizations: No    Attends Engineer, Structural: Never    Marital Status: Never married   Past Medical History:  Past Medical History:  Diagnosis Date   Depression    Diabetes mellitus without complication (HCC)    Hyperlipidemia    Hypertension    Lupus anticoagulant disorder 06/14/2012   as per Dr.pandit's note in dec 2013.   PE (pulmonary thromboembolism) (HCC)    Schizo affective schizophrenia (HCC)    Supratherapeutic INR 11/19/2016   History reviewed. No pertinent surgical history.  Current Medications: Current Facility-Administered Medications  Medication Dose Route Frequency Provider Last Rate Last Admin   acetaminophen  (TYLENOL ) tablet 650 mg  650 mg Oral Q6H PRN [name], [name], NP   650 mg at 05/22/24 1203   albuterol  (VENTOLIN  HFA) 108 (90 Base) MCG/ACT inhaler 2 puff  2 puff Inhalation Q6H PRN [name], [name], NP       alum & mag hydroxide-simeth (MAALOX/MYLANTA) 200-200-20 MG/5ML suspension 30 mL  30 mL Oral Q4H PRN [name], [name], NP       apixaban  (ELIQUIS ) tablet 2.5 mg  2.5 mg Oral BID [name], [name], NP   2.5 mg at 05/23/24 9052   clonazePAM  (KLONOPIN ) tablet 1 mg  1 mg Oral BID Tobias Avitabile, MD       cloZAPine  (CLOZARIL ) tablet 250 mg  250 mg Oral QHS [name], [name], NP   250 mg at 05/22/24 2357   haloperidol  (HALDOL ) tablet 5 mg  5 mg Oral TID PRN [name], [name], NP   5 mg at 05/22/24 1424   And   diphenhydrAMINE  (BENADRYL ) capsule 50 mg  50 mg Oral TID PRN [name], [name], NP   50 mg at 05/22/24 1424   haloperidol  lactate (HALDOL ) injection 5 mg  5 mg Intramuscular TID PRN [name], [name], NP   5 mg at 05/21/24 1541   And   diphenhydrAMINE  (BENADRYL )  injection 50 mg  50 mg Intramuscular TID PRN [name], [name], NP       And   LORazepam  (ATIVAN ) injection 2 mg  2 mg Intramuscular TID PRN [name], [name], NP       haloperidol  lactate (HALDOL ) injection 10 mg  10 mg Intramuscular TID PRN [name], [name], NP       And   diphenhydrAMINE  (BENADRYL ) injection 50 mg  50 mg Intramuscular TID PRN [name], [name], NP   50 mg at 05/21/24 1541   And   LORazepam  (ATIVAN ) injection  2 mg  2 mg Intramuscular TID PRN [name], [name], NP   2 mg at 05/21/24 1540   feeding supplement (GLUCERNA SHAKE) (GLUCERNA SHAKE) liquid 237 mL  237 mL Oral TID BM Julie-Ann Vanmaanen, MD       fluticasone  furoate-vilanterol (BREO ELLIPTA ) 200-25 MCG/ACT 1 puff  1 puff Inhalation Daily [name], [name], NP   1 puff at 05/23/24 9047   hydrOXYzine  (ATARAX ) tablet 25 mg  25 mg Oral Daily PRN [name], [name], NP   25 mg at 05/21/24 1531   lisinopril  (ZESTRIL ) tablet 2.5 mg  2.5 mg Oral Daily [name], [name], NP   2.5 mg at 05/23/24 9050   lithium  carbonate (ESKALITH ) ER tablet 450 mg  450 mg Oral QHS [name], [name], NP   450 mg at 05/22/24 2358   magnesium  hydroxide (MILK OF MAGNESIA) suspension 30 mL  30 mL Oral Daily PRN [name], [name], NP       metFORMIN  (GLUCOPHAGE ) tablet 1,000 mg  1,000 mg Oral BID WC [name], [name], NP   1,000 mg at 05/23/24 9050   risperiDONE  (RISPERDAL ) tablet 1 mg  1 mg Oral BID [name], [name], NP   1 mg at 05/23/24 9050   simvastatin  (ZOCOR ) tablet 40 mg  40 mg Oral q1800 [name], [name], MD   40 mg at 05/22/24 1702   traZODone  (DESYREL ) tablet 100 mg  100 mg Oral QHS [name], [name], NP   100 mg at 05/22/24 2358    Lab Results:  Results for orders placed or performed during the hospital encounter of 05/21/24 (from the past 48 hours)  Lipid panel     Status: Abnormal   Collection Time: 05/22/24  8:44 PM  Result Value Ref Range   Cholesterol 160 0 - 200 mg/dL    Comment:        ATP III CLASSIFICATION:  <200     mg/dL   Desirable  799-760  mg/dL   Borderline  High  >=759    mg/dL   High           Triglycerides 310 (H) <150 mg/dL   HDL 36 (L) >59 mg/dL   Total CHOL/HDL Ratio 4.4 RATIO   VLDL 62 (H) 0 - 40 mg/dL   LDL Cholesterol 62 0 - 99 mg/dL    Comment:        Total Cholesterol/HDL:CHD Risk Coronary Heart Disease Risk Table                     Men   Women  1/2 Average Risk   3.4   3.3  Average Risk       5.0   4.4  2 X Average Risk   9.6   7.1  3 X Average Risk  23.4   11.0        Use the calculated Patient Ratio above and the CHD Risk Table to determine the patient's CHD Risk.        ATP III CLASSIFICATION (LDL):  <100     mg/dL   Optimal  899-870  mg/dL   Near or Above                    Optimal  130-159  mg/dL   Borderline  839-810  mg/dL   High  >809     mg/dL   Very High Performed at [facility], [address] [place], [place] [address]  Hemoglobin A1c     Status: Abnormal   Collection Time: 05/22/24  8:44 PM  Result Value Ref Range   Hgb A1c MFr Bld 5.7 (H) 4.8 - 5.6 %    Comment: (NOTE) Diagnosis of Diabetes The following HbA1c ranges recommended by the American Diabetes Association (ADA) may be used as an aid in the diagnosis of diabetes mellitus.  Hemoglobin             Suggested A1C NGSP%              Diagnosis  <5.7                   Non Diabetic  5.7-6.4                Pre-Diabetic  >6.4                   Diabetic  <7.0                   Glycemic control for                       adults with diabetes.     Mean Plasma Glucose 116.89 mg/dL    Comment: Performed at [facility], [address] [place], [place] [address]    Blood Alcohol level:  Lab Results  Component Value Date   [facility] <15 05/20/2024   ETH <15 05/19/2024    Metabolic Disorder Labs: Lab Results  Component Value Date   HGBA1C 5.7 (H) 05/22/2024   MPG 116.89 05/22/2024   MPG 93.93 09/10/2023   Lab Results  Component Value Date   PROLACTIN 2.0 (L) 08/31/2016   PROLACTIN 4.8 05/22/2016   Lab Results   Component Value Date   CHOL 160 05/22/2024   TRIG 310 (H) 05/22/2024   HDL 36 (L) 05/22/2024   CHOLHDL 4.4 05/22/2024   VLDL 62 (H) 05/22/2024   LDLCALC 62 05/22/2024   LDLCALC 68 05/02/2024    Physical Findings: AIMS:  , ,  ,  ,    CIWA:    COWS:      Psychiatric Specialty Exam:  Presentation  General Appearance:  Disheveled  Eye Contact: Poor  Speech: Garbled  Speech Volume: Normal    Mood and Affect  Mood: Dysphoric  Affect: Depressed   Thought Process  Thought Processes: Coherent  Orientation:Full (Time, Place and Person)  Thought Content:WDL  Hallucinations:Hallucinations: Auditory; Command  Ideas of Reference:None  Suicidal Thoughts:Suicidal Thoughts: Yes, Active SI Active Intent and/or Plan: Without Intent; With Plan  Homicidal Thoughts:Homicidal Thoughts: No   Sensorium  Memory: Immediate Fair; Recent Fair  Judgment: Poor  Insight: Poor   Executive Functions  Concentration: Fair  Attention Span: Fair  Recall: [employer] of Knowledge: Fair  Language: Fair   Psychomotor Activity  Psychomotor Activity: Psychomotor Activity: Normal  Musculoskeletal: Strength & Muscle Tone: decreased Gait & Station: unsteady Assets  Assets: Manufacturing Systems Engineer; Desire for Improvement    Physical Exam: Physical Exam Vitals and nursing note reviewed.    ROS Blood pressure 126/73, pulse 74, temperature (!) 97.4 F (36.3 C), temperature source Oral, resp. rate 16, height 5' 10 (1.778 m), weight 86.3 kg, SpO2 100%. Body mass index is 27.3 kg/m.  Diagnosis: Principal Problem:   Schizophrenia (HCC)   reatment Plan Summary:  Safety and Monitoring:             -- Involuntary admission to inpatient  psychiatric unit for safety, stabilization and treatment             -- Daily contact with patient to assess and evaluate symptoms and progress in treatment             -- Patient's case to be discussed in multi-disciplinary  team meeting             -- Observation Level: q15 minute checks             -- Vital signs:  q12 hours             -- Precautions: suicide, elopement, and assault   2. Psychiatric Diagnoses and Treatment:               Home medications continued:  Klonopin  2 mg twice daily- reduced to 1mg  BID Clozaril  250 mg daily at bedtime Lithium  ER 450 mg daily at bedtime- check BMP as last Cr is 1.45 Melatonin 5 mg daily at bedtime- discontinued due to excessive sedation and fall Propanolol 20 mg at bedtime- discontinued due to excessive sedation and fall Risperidone  1 mg twice daily Trazodone  100 mg daily at bedtime   -- The risks/benefits/side-effects/alternatives to this medication were discussed in detail with the patient and time was given for questions. The patient consents to medication trial.                -- Metabolic profile and EKG monitoring obtained while on an atypical antipsychotic (BMI: Lipid Panel: HbgA1c: QTc:)              -- Encouraged patient to participate in unit milieu and in scheduled group therapies                            3. Medical Issues Being Addressed:  No acute concerns.   4. Discharge Planning:   -- Social work and case management to assist with discharge planning and identification of hospital follow-up needs prior to discharge  -- Estimated LOS: 3-4 days  Sahmya Arai, MD 05/23/2024, 1:54 PM

## 2024-05-23 NOTE — Plan of Care (Signed)
  Problem: Education: Goal: Mental status will improve Outcome: Progressing   Problem: Activity: Goal: Interest or engagement in activities will improve Outcome: Progressing   Problem: Coping: Goal: Ability to verbalize frustrations and anger appropriately will improve Outcome: Progressing   Problem: Physical Regulation: Goal: Ability to maintain clinical measurements within normal limits will improve Outcome: Progressing   Problem: Safety: Goal: Periods of time without injury will increase Outcome: Progressing
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
## 2024-05-23 NOTE — [facility] Suicide Risk Assessment (Signed)
 [facility] INPATIENT:  Family/Significant Other Suicide Prevention Education  Suicide Prevention Education:  Education Completed; Lance [name] [phone], has been identified by the patient as the family member/significant other with whom the patient will be residing, and identified as the person(s) who will aid the patient in the event of a mental health crisis (suicidal ideations/suicide attempt).  With written consent from the patient, the family member/significant other has been provided the following suicide prevention education, prior to the and/or following the discharge of the patient.  The suicide prevention education provided includes the following: Suicide risk factors Suicide prevention and interventions National Suicide Hotline telephone number [facility] assessment telephone number [facility] Emergency Assistance 911 [facility] and/or Residential Mobile Crisis Unit telephone number  Request made of family/significant other to: Remove weapons (e.g., guns, rifles, knives), all items previously/currently identified as safety concern.   Remove drugs/medications (over-the-counter, prescriptions, illicit drugs), all items previously/currently identified as a safety concern.  The family member/significant other verbalizes understanding of the suicide prevention education information provided.  The family member/significant other agrees to remove the items of safety concern listed above.  Lance [name] 05/23/2024, 10:57 AM

## 2024-05-23 NOTE — Progress Notes (Signed)
   05/23/24 0210  What Happened  Was fall witnessed? Yes  Who witnessed fall? Omaya  Patients activity before fall bathroom-assisted  Point of contact other (comment) (pt assisted to the ground)  Was patient injured? No  Provider Notification  Provider Name/Title Jon Brook Lauchlin NP  Date Provider Notified 05/23/24  Time Provider Notified 0230  Method of Notification Call (secure chat)  Notification Reason Fall (pt was assisted to the ground)  Provider response Other (Comment)  Follow Up  Family notified No - patient refusal  Additional tests No  Simple treatment Other (comment) (N/A)  Progress note created (see row info) Yes  Adult Fall Risk Assessment  Risk Factor Category (scoring not indicated) High fall risk per protocol (document High fall risk)  Patient Fall Risk Level High fall risk  Adult Fall Risk Interventions  Required Bundle Interventions *See Row Information* High fall risk  Screening for Fall Injury Risk (To be completed on HIGH fall risk patients) - Assessing Need for Floor Mats  Risk For Fall Injury- Criteria for Floor Mats None identified - No additional interventions needed  Pain Assessment  Pain Scale 0-10  Pain Score 0   Pt was ambulating to the bathroom assisted , pt began to slip and was assisted to the ground, pt did not hit the ground and was not injured.

## 2024-05-23 NOTE — Group Note (Signed)
 Date:  05/23/2024 Time:  9:27 PM  Group Topic/Focus:  Emotional Education:   The focus of this group is to discuss what feelings/emotions are, and how they are experienced. Wrap-Up Group:   The focus of this group is to help patients review their daily goal of treatment and discuss progress on daily workbooks.    Participation Level:  Did Not Attend    Lance [name] 05/23/2024, 9:27 PM

## 2024-05-23 NOTE — Progress Notes (Signed)
 Pt is on 1:1 with a sitter present for safety. Pt compliant with scheduled medications tonight. Pt denies SI/HI. Pt endorses A/H but states they are not commanding. Pt will continue to be monitored 1:1 for safety

## 2024-05-23 NOTE — Plan of Care (Signed)
  Problem: Education: Goal: Mental status will improve Outcome: Progressing   Problem: Activity: Goal: Sleeping patterns will improve Outcome: Progressing   Problem: Activity: Goal: Sleeping patterns will improve Outcome: Progressing   Problem: Activity: Goal: Interest or engagement in activities will improve Outcome: Not Progressing

## 2024-05-23 NOTE — Group Note (Signed)
 Date:  05/23/2024 Time:  10:16 AM  Group Topic/Focus:  Pet Therapy: Rollo the Dog has come to visit the unit!     Participation Level:  Active  Participation Quality:  Appropriate  Affect:  Appropriate  Cognitive:  Appropriate  Insight: Appropriate  Engagement in Group:  Engaged  Modes of Intervention:  Activity  Additional Comments:    Lance [name] Lance [name] 05/23/2024, 10:16 AM

## 2024-05-23 NOTE — Group Note (Signed)
 Recreation Therapy Group Note   Group Topic:Health and Wellness  Group Date: 05/23/2024 Start Time: 1525 End Time: 1555 Facilitators: Celestia Jeoffrey BRAVO, LRT, CTRS Location: Back Dayroom  Group Description: Light Exercise & Stretching. LRT discussed the mental and physical benefits of exercise. LRT and group discussed how physical activity can be used as a coping skill. LRT lead patients in a light exercise and stretching routine, with music being played in the background. Pt's encouraged to listen to their bodies and stop at any time if they experience feelings of discomfort or pain. Pts were encouraged to drink water and stay hydrated.   Goal Area(s) Addressed:  Patient will learn benefits of physical activity. Patient will identify exercise as a coping skill.  Patient will follow multistep directions. Patient will try a new leisure interest.   Affect/Mood: Flat   Participation Level: Minimal    Clinical Observations/Individualized Feedback: Chesley was present for the first 5 minutes of group.   Plan: Continue to engage patient in RT group sessions 2-3x/week.   Jeoffrey BRAVO Celestia, LRT, CTRS 05/23/2024 4:55 PM

## 2024-05-24 DIAGNOSIS — F200 Paranoid schizophrenia: Secondary | ICD-10-CM

## 2024-05-24 MED ORDER — CLONAZEPAM 1 MG PO TABS
1.0000 mg | ORAL_TABLET | Freq: Every day | ORAL | Status: DC
  Administered 2024-05-25 – 2024-06-05 (×12): 1 mg via ORAL
  Filled 2024-05-24 (×12): qty 1

## 2024-05-24 MED ORDER — LACTULOSE 10 GM/15ML PO SOLN
20.0000 g | Freq: Two times a day (BID) | ORAL | Status: DC | PRN
  Administered 2024-05-25: 20 g via ORAL
  Filled 2024-05-24 (×2): qty 30

## 2024-05-24 MED ORDER — CLONAZEPAM 0.5 MG PO TABS
0.5000 mg | ORAL_TABLET | Freq: Every morning | ORAL | Status: DC
  Filled 2024-05-24: qty 1

## 2024-05-24 NOTE — Progress Notes (Signed)
 Pt on 1:1 for safety with a sitter present. Pt is without concern at this time. Pt will continue to be on 1:1

## 2024-05-24 NOTE — Group Note (Signed)
 Date:  05/24/2024 Time:  8:35 PM  Group Topic/Focus:  Wrap-Up Group:   The focus of this group is to help patients review their daily goal of treatment and discuss progress on daily workbooks.    Participation Level:  Did Not Attend  Lance [name] 05/24/2024, 8:35 PM

## 2024-05-24 NOTE — Progress Notes (Signed)
 [facility] MD Progress Note  05/24/2024 12:25 PM Lance [name]  MRN:  [id number]  Lance [name] is a 48 y.o. male admitted: Presented to the EDfor 05/20/2024  3:15 PM for Patient arrived with IVC papers. Patient was transported to the crisis center by law enforcement after being found lying in front of tobacco store. Upon contact, he reported hearing voices. SABRA He carries the psychiatric diagnoses of Schizophrenia and has a past medical history of  Chart review significant for diabetes mellitus, hx of pulmonary embolus. Patient is admitted to adult psych unit with Q15 min safety monitoring. Multidisciplinary team approach is offered. Medication management; group/milieu therapy is offered.  Subjective:  Chart reviewed, case discussed in multidisciplinary meeting, patient seen during rounds.   12/10: On interview today, patient is noted to be resting in bed with one-to-one sitter in the room.  Patient is noted to be drowsy, but wakes on verbal commands.  He reports drowsiness during the day.  He reports good appetite.  He denies current SI at this time.  He denies HI/plan.  He endorses occasional auditory hallucinations but denies command hallucinations.  He endorses visual hallucinations of seeing faces.  He rates anxiety as 7 out of 10 and depression as 7-8 out of 10 today.  12/9: Patient is noted to be resting in bed with one-to-one sitter in the room.  Patient is noted to be very drowsy.  He was able to wake up for the verbal commands.  He reports that he is feeling out of it.  He reports having abdominal pain on the left side.  Being oncological patient encouraged to inform the staff if he is constipated.  Patient reports having bowel movements.  Patient denies having any urinary symptoms.  Patient vitals are stable except for low blood pressure.  Patient received increased dose of Klonopin  during this admission 2 mg twice daily.  Patient reports ongoing thoughts of suicide but denies having any active  plan.  Patient denies auditory/visual hallucinations.  Discussed the plan to reduce the dosing of Klonopin , remove melatonin and discontinue propranolol  due to low blood pressure.  Past Psychiatric History: see h&P Family History:  Family History  Problem Relation Age of Onset   CAD Mother    Rheum arthritis Mother    Healthy Father    CAD Sister    Obesity Sister    Social History:  Social History   Substance and Sexual Activity  Alcohol Use Not Currently   Comment: occassionally     Social History   Substance and Sexual Activity  Drug Use Yes   Types: Marijuana   Comment: Patient reports occasionally    Social History   Socioeconomic History   Marital status: Single    Spouse name: Not on file   Number of children: Not on file   Years of education: Not on file   Highest education level: Not on file  Occupational History   Not on file  Tobacco Use   Smoking status: Every Day    Current packs/day: 1.00    Average packs/day: 1 pack/day for 23.0 years (23.0 ttl pk-yrs)    Types: Cigarettes   Smokeless tobacco: Never   Tobacco comments:    will provide material  Vaping Use   Vaping status: Every Day  Substance and Sexual Activity   Alcohol use: Not Currently    Comment: occassionally   Drug use: Yes    Types: Marijuana    Comment: Patient reports occasionally   Sexual  activity: Not Currently    Birth control/protection: None    Comment: occasional marijuana- none recently  Other Topics Concern   Not on file  Social History Narrative   From a group home in [place]   Social Drivers of Health   Financial Resource Strain: Low Risk  (03/20/2021)   Overall Financial Resource Strain (CARDIA)    Difficulty of Paying Living Expenses: Not very hard  Food Insecurity: No Food Insecurity (05/21/2024)   Hunger Vital Sign    Worried About Running Out of Food in the Last Year: Never true    Ran Out of Food in the Last Year: Never true  Transportation Needs: No  Transportation Needs (05/21/2024)   PRAPARE - Administrator, Civil Service (Medical): No    Lack of Transportation (Non-Medical): No  Physical Activity: Insufficiently Active (03/20/2021)   Exercise Vital Sign    Days of Exercise per Week: 3 days    Minutes of Exercise per Session: 20 min  Stress: No Stress Concern Present (03/20/2021)   [employer] of Occupational Health - Occupational Stress Questionnaire    Feeling of Stress : Only a little  Social Connections: Socially Isolated (03/20/2021)   Social Connection and Isolation Panel    Frequency of Communication with Friends and Family: More than three times a week    Frequency of Social Gatherings with Friends and Family: More than three times a week    Attends Religious Services: Never    Database Administrator or Organizations: No    Attends Engineer, Structural: Never    Marital Status: Never married   Past Medical History:  Past Medical History:  Diagnosis Date   Depression    Diabetes mellitus without complication (HCC)    Hyperlipidemia    Hypertension    Lupus anticoagulant disorder 06/14/2012   as per Dr.pandit's note in dec 2013.   PE (pulmonary thromboembolism) (HCC)    Schizo affective schizophrenia (HCC)    Supratherapeutic INR 11/19/2016   History reviewed. No pertinent surgical history.  Current Medications: Current Facility-Administered Medications  Medication Dose Route Frequency Provider Last Rate Last Admin   acetaminophen  (TYLENOL ) tablet 650 mg  650 mg Oral Q6H PRN [name], [name], NP   650 mg at 05/22/24 1203   albuterol  (VENTOLIN  HFA) 108 (90 Base) MCG/ACT inhaler 2 puff  2 puff Inhalation Q6H PRN [name], [name], NP       alum & mag hydroxide-simeth (MAALOX/MYLANTA) 200-200-20 MG/5ML suspension 30 mL  30 mL Oral Q4H PRN [name], [name], NP       apixaban  (ELIQUIS ) tablet 2.5 mg  2.5 mg Oral BID [name], [name], NP   2.5 mg at 05/24/24 0817   clonazePAM  (KLONOPIN ) tablet 1 mg  1 mg  Oral BID [name], [name], MD   1 mg at 05/24/24 0818   cloZAPine  (CLOZARIL ) tablet 250 mg  250 mg Oral QHS [name], [name], NP   250 mg at 05/23/24 2107   haloperidol  (HALDOL ) tablet 5 mg  5 mg Oral TID PRN [name], [name], NP   5 mg at 05/22/24 1424   And   diphenhydrAMINE  (BENADRYL ) capsule 50 mg  50 mg Oral TID PRN [name], [name], NP   50 mg at 05/22/24 1424   haloperidol  lactate (HALDOL ) injection 5 mg  5 mg Intramuscular TID PRN [name], [name], NP   5 mg at 05/21/24 1541   And   diphenhydrAMINE  (BENADRYL ) injection 50 mg  50 mg Intramuscular TID PRN [name], [name], NP       And   LORazepam  (ATIVAN ) injection 2 mg  2 mg Intramuscular TID PRN [name], [name], NP       haloperidol  lactate (HALDOL ) injection 10 mg  10 mg Intramuscular TID PRN [name], [name], NP       And   diphenhydrAMINE  (BENADRYL ) injection 50 mg  50 mg Intramuscular TID PRN [name], [name], NP   50 mg at 05/21/24 1541   And   LORazepam  (ATIVAN ) injection 2 mg  2 mg Intramuscular TID PRN [name], [name], NP   2 mg at 05/21/24 1540   feeding supplement (GLUCERNA SHAKE) (GLUCERNA SHAKE) liquid 237 mL  237 mL Oral TID BM [name], [name], MD   237 mL at 05/23/24 1703   fluticasone  furoate-vilanterol (BREO ELLIPTA ) 200-25 MCG/ACT 1 puff  1 puff Inhalation Daily [name], [name], NP   1 puff at 05/24/24 9180   hydrOXYzine  (ATARAX ) tablet 25 mg  25 mg Oral Daily PRN [name], [name], NP   25 mg at 05/21/24 1531   lisinopril  (ZESTRIL ) tablet 2.5 mg  2.5 mg Oral Daily [name], [name], NP   2.5 mg at 05/24/24 9182   lithium  carbonate (ESKALITH ) ER tablet 450 mg  450 mg Oral QHS [name], [name], NP   450 mg at 05/23/24 2107   magnesium  hydroxide (MILK OF MAGNESIA) suspension 30 mL  30 mL Oral Daily PRN [name], [name], NP   30 mL at 05/24/24 1030   metFORMIN  (GLUCOPHAGE ) tablet 1,000 mg  1,000 mg Oral BID WC [name], [name], NP   1,000 mg at 05/24/24 9182   risperiDONE  (RISPERDAL ) tablet 1 mg  1 mg Oral BID [name], [name], NP   1 mg at 05/24/24  9182   simvastatin  (ZOCOR ) tablet 40 mg  40 mg Oral q1800 [name], [name], MD   40 mg at 05/23/24 1703   traZODone  (DESYREL ) tablet 100 mg  100 mg Oral QHS [name], [name], NP   100 mg at 05/23/24 2107    Lab Results:  Results for orders placed or performed during the hospital encounter of 05/21/24 (from the past 48 hours)  Lipid panel     Status: Abnormal   Collection Time: 05/22/24  8:44 PM  Result Value Ref Range   Cholesterol 160 0 - 200 mg/dL    Comment:        ATP III CLASSIFICATION:  <200     mg/dL   Desirable  799-760  mg/dL   Borderline High  >=759    mg/dL   High           Triglycerides 310 (H) <150 mg/dL   HDL 36 (L) >59 mg/dL   Total CHOL/HDL Ratio 4.4 RATIO   VLDL 62 (H) 0 - 40 mg/dL   LDL Cholesterol 62 0 - 99 mg/dL    Comment:        Total Cholesterol/HDL:CHD Risk Coronary Heart Disease Risk Table                     Men   Women  1/2 Average Risk   3.4   3.3  Average Risk       5.0   4.4  2 X Average Risk   9.6   7.1  3 X Average Risk  23.4   11.0        Use the calculated Patient Ratio above  and the CHD Risk Table to determine the patient's CHD Risk.        ATP III CLASSIFICATION (LDL):  <100     mg/dL   Optimal  899-870  mg/dL   Near or Above                    Optimal  130-159  mg/dL   Borderline  839-810  mg/dL   High  >809     mg/dL   Very High Performed at [facility], [address] [place], [place] [address]   Hemoglobin A1c     Status: Abnormal   Collection Time: 05/22/24  8:44 PM  Result Value Ref Range   Hgb A1c MFr Bld 5.7 (H) 4.8 - 5.6 %    Comment: (NOTE) Diagnosis of Diabetes The following HbA1c ranges recommended by the American Diabetes Association (ADA) may be used as an aid in the diagnosis of diabetes mellitus.  Hemoglobin             Suggested A1C NGSP%              Diagnosis  <5.7                   Non Diabetic  5.7-6.4                Pre-Diabetic  >6.4                   Diabetic  <7.0                    Glycemic control for                       adults with diabetes.     Mean Plasma Glucose 116.89 mg/dL    Comment: Performed at [facility], [address] [place], [place] [address]  Basic metabolic panel     Status: Abnormal   Collection Time: 05/23/24  4:59 PM  Result Value Ref Range   Sodium 142 135 - 145 mmol/L   Potassium 5.1 3.5 - 5.1 mmol/L   Chloride 104 98 - 111 mmol/L   CO2 24 22 - 32 mmol/L   Glucose, Bld 152 (H) 70 - 99 mg/dL    Comment: Glucose reference range applies only to samples taken after fasting for at least 8 hours.   BUN 26 (H) 6 - 20 mg/dL   Creatinine, Ser 8.86 0.61 - 1.24 mg/dL   Calcium 89.9 8.9 - 89.6 mg/dL   GFR, Estimated >39 >39 mL/min    Comment: (NOTE) Calculated using the CKD-EPI Creatinine Equation (2021)    Anion gap 14 5 - 15    Comment: Performed at [facility], [address] [place], [place] [address]    Blood Alcohol level:  Lab Results  Component Value Date   [facility] <15 05/20/2024   [facility] <15 05/19/2024    Metabolic Disorder Labs: Lab Results  Component Value Date   HGBA1C 5.7 (H) 05/22/2024   MPG 116.89 05/22/2024   MPG 93.93 09/10/2023   Lab Results  Component Value Date   PROLACTIN 2.0 (L) 08/31/2016   PROLACTIN 4.8 05/22/2016   Lab Results  Component Value Date   CHOL 160 05/22/2024   TRIG 310 (H) 05/22/2024   HDL 36 (L) 05/22/2024   CHOLHDL 4.4 05/22/2024   VLDL 62 (H) 05/22/2024   LDLCALC 62 05/22/2024   LDLCALC 68  05/02/2024    Physical Findings: AIMS:  , ,  ,  ,    CIWA:    COWS:      Psychiatric Specialty Exam:  Presentation  General Appearance:  Disheveled  Eye Contact: Poor  Speech: Garbled  Speech Volume: Normal    Mood and Affect  Mood: Dysphoric  Affect: Depressed   Thought Process  Thought Processes: Coherent  Orientation:Full (Time, Place and Person)  Thought Content:WDL  Hallucinations: Auditory, visual  Ideas of Reference:None  Suicidal  Thoughts: Denies  Homicidal Thoughts: Denies   Sensorium  Memory: Immediate Fair; Recent Fair  Judgment: Poor  Insight: Poor   Executive Functions  Concentration: Fair  Attention Span: Fair  Recall: [employer] of Knowledge: Fair  Language: Fair   Psychomotor Activity  Psychomotor Activity: Normal  Musculoskeletal: Strength & Muscle Tone: decreased Gait & Station: unsteady Assets  Assets: Manufacturing Systems Engineer; Desire for Improvement    Physical Exam: Physical Exam Vitals and nursing note reviewed.    ROS Blood pressure 120/80, pulse 76, temperature (!) 97.4 F (36.3 C), temperature source Oral, resp. rate 18, height 5' 10 (1.778 m), weight 86.3 kg, SpO2 99%. Body mass index is 27.3 kg/m.  Diagnosis: Principal Problem:   Schizophrenia, paranoid (HCC)   reatment Plan Summary:  Safety and Monitoring:             -- Involuntary admission to inpatient psychiatric unit for safety, stabilization and treatment             -- Daily contact with patient to assess and evaluate symptoms and progress in treatment             -- Patient's case to be discussed in multi-disciplinary team meeting             -- Observation Level: q15 minute checks             -- Vital signs:  q12 hours             -- Precautions: suicide, elopement, and assault   2. Psychiatric Diagnoses and Treatment:               Home medications continued:  Klonopin  1mg  BID - will reduce to 0.5 mg in the morning and 1 mg in the evening Clozaril  250 mg daily at bedtime Lithium  ER 450 mg daily at bedtime - BMP 05/23/24 showed Cr 1.13 Melatonin 5 mg daily at bedtime- discontinued due to excessive sedation and fall Propanolol 20 mg at bedtime- discontinued due to excessive sedation and fall Risperidone  1 mg twice daily Trazodone  100 mg daily at bedtime   -- The risks/benefits/side-effects/alternatives to this medication were discussed in detail with the patient and time was given for  questions. The patient consents to medication trial.                -- Metabolic profile and EKG monitoring obtained while on an atypical antipsychotic (BMI: Lipid Panel: HbgA1c: QTc:)              -- Encouraged patient to participate in unit milieu and in scheduled group therapies                            3. Medical Issues Being Addressed:  No acute concerns. Lactulose  added for constipation.   4. Discharge Planning:   -- Social work and case management to assist with discharge planning and identification of hospital follow-up needs prior  to discharge  -- Estimated LOS: 5-7 days  Camelia LITTIE Lukes, PA-C 05/24/2024, 12:25 PM

## 2024-05-24 NOTE — Plan of Care (Signed)
  Problem: Education: Goal: Emotional status will improve Outcome: Progressing   Problem: Activity: Goal: Interest or engagement in activities will improve Outcome: Progressing   Problem: Coping: Goal: Ability to verbalize frustrations and anger appropriately will improve Outcome: Progressing

## 2024-05-24 NOTE — Group Note (Signed)

## 2024-05-24 NOTE — Group Note (Signed)
 Date:  05/24/2024 Time:  10:59 AM  Group Topic/Focus:  Building Self Esteem:   The Focus of this group is helping patients become aware of the effects of self-esteem on their lives, the things they and others do that enhance or undermine their self-esteem, seeing the relationship between their level of self-esteem and the choices they make and learning ways to enhance self-esteem.    Participation Level:  Did Not Attend    [name] Ladarion Munyon 05/24/2024, 10:59 AM

## 2024-05-24 NOTE — Group Note (Signed)
 Date:  05/24/2024 Time:  5:31 PM  Group Topic/Focus:  Wellness Toolbox:   The focus of this group is to discuss various aspects of wellness, balancing those aspects and exploring ways to increase the ability to experience wellness.  Patients will create a wellness toolbox for use upon discharge.    Participation Level:  Active  Participation Quality:  Appropriate  Affect:  Appropriate  Cognitive:  Appropriate  Insight: Appropriate  Engagement in Group:  Engaged  Modes of Intervention:  Activity  Additional Comments:    Lance [name] Lance [name] 05/24/2024, 5:31 PM

## 2024-05-24 NOTE — B[facility] Counselor (Signed)
 Adult Comprehensive Assessment  Patient ID: Lance [name], male   DOB: [birth year], 48 y.o.   MRN: [id number]  Information Source: Information source: Patient  Current Stressors:  Patient states their primary concerns and needs for treatment are:: I'm having extreme anxiety attacks and I'm feeling suicidal. I can't sleep like I'm supposed to. Patient states their goals for this hospitilization and ongoing recovery are:: To get help. Educational / Learning stressors: None reported. Employment / Job issues: None reported. Family Relationships: My family is dead. Financial / Lack of resources (include bankruptcy): I don't have any financials. Housing / Lack of housing: My group home is ok but last thing I heard, they didn't want me to come back. Physical health (include injuries & life threatening diseases): Patient denied. Social relationships: Patient denied. Substance abuse: Patient denied. Bereavement / Loss: Patient denied.  Living/Environment/Situation:  Living Arrangements: Group Home Living conditions (as described by patient or guardian): [facility] Who else lives in the home?: Patient lives with other residents. How long has patient lived in current situation?: About 3-4 months. What is atmosphere in current home: Other (Comment)  Family History:  Are you sexually active?: No What is your sexual orientation?: Unable to assess. Has your sexual activity been affected by drugs, alcohol, medication, or emotional stress?: Unable to assess. Does patient have children?: No  Childhood History:  By whom was/is the patient raised?: Both parents Does patient have siblings?: Yes Number of Siblings: 1 Description of patient's current relationship with siblings: Patient's sister is deceased.  Education:  Highest grade of school patient has completed: 8th or 9th. Currently a student?: No Learning disability?: No What learning problems does patient  have?: ADD when I was a kid.  Employment/Work Situation:   Employment Situation: On disability Why is Patient on Disability: Mental health. How Long has Patient Been on Disability: Since I was in my twenties.  Financial Resources:   Financial resources: Medicare, Medicaid, Receives SSDI Name of representative payee or guardian: Lance [name], legal guardian, [phone]  Alcohol/Substance Abuse:   What has been your use of drugs/alcohol within the last 12 months?: Patient denied. If attempted suicide, did drugs/alcohol play a role in this?: No Alcohol/Substance Abuse Treatment Hx: Denies past history Has alcohol/substance abuse ever caused legal problems?: No  Social Support System:   Patient's Community Support System: Good Describe Community Support System: Lance [name] and as far as I know that's it. Type of faith/religion: Spiritual. How does patient's faith help to cope with current illness?: I don't want to talk about it.  Leisure/Recreation:   Do You Have Hobbies?: Yes  Strengths/Needs:   What is the patient's perception of their strengths?: Unable to assess. Patient states they can use these personal strengths during their treatment to contribute to their recovery: Unable to assess. Patient states these barriers may affect/interfere with their treatment: Patient denied. Patient states these barriers may affect their return to the community: Patient denied. Other important information patient would like considered in planning for their treatment: Patient would like therapy and psychiatry.  Discharge Plan:   Currently receiving community mental health services: No Patient states concerns and preferences for aftercare planning are: Patient would like therapy and psychiatry. Patient states they will know when they are safe and ready for discharge when: When I get over these anxiety attacks and get sleep. Does patient have access to transportation?: Yes Does  patient have financial barriers related to discharge medications?: No Patient description of barriers related to discharge  medications: None reported. Will patient be returning to same living situation after discharge?: Yes  Summary/Recommendations:   Summary and Recommendations (to be completed by the evaluator): Patient is a 48 year old male from [place], [place] [facility]) who presented to the ED under IVC after being found lying in front of a tobacco store according to chart. During assessment with this clinical research associate, patient reported I'm having extreme anxiety attacks and I'm feeling suicidal. I can't sleep like I'm supposed to. When asked of family stressors, patient reported My family is dead. Patient is currently living in [facility] group home. When asked of financial stressors, patient reported My group home is ok but last thing I heard, they didn't want me to come back. Patient currently receives disability for mental health and has been on it since my twenties. Patient is currently unemployed but received Medicaid, Medicare and social security. Patient denied substance use. Patient reported receiving good support from Lance [name] and as far as I know that's it. Patient has a legal guardian, Lance [name]. Patient reported that they would like a referral for therapy and psychiatry at discharge. Patient denied SI, HI and AVH. Patients current diagnosis is Schizophrenia, paranoid (HCC). Recommendations include: crisis stabilization, therapeutic milieu, encourage group attendance and participation, medication management for mood stabilization and development of a comprehensive mental wellness/sobriety plan.  Zachari Alberta M Brinlyn Cena. 05/24/2024

## 2024-05-24 NOTE — Progress Notes (Signed)
°   05/24/24 1000  Psych Admission Type (Psych Patients Only)  Admission Status Voluntary  Psychosocial Assessment  Patient Complaints Anhedonia  Eye Contact Brief  Facial Expression Flat  Affect Sad  Speech Soft  Interaction Minimal  Motor Activity Slow  Appearance/Hygiene In scrubs  Behavior Characteristics Cooperative  Mood Sad  Aggressive Behavior  Effect No apparent injury  Thought Process  Coherency WDL  Content WDL  Delusions None reported or observed  Perception Hallucinations  Hallucination Auditory  Judgment Impaired  Confusion None  Danger to Self  Current suicidal ideation? Denies  Agreement Not to Harm Self Yes  Description of Agreement verbal  Danger to Others  Danger to Others None reported or observed   Patient ambulated with walker. Patient states  feels little better from yesterday. Safety maintained with 1: 1 sitter.

## 2024-05-25 DIAGNOSIS — F200 Paranoid schizophrenia: Secondary | ICD-10-CM | POA: Diagnosis not present

## 2024-05-25 MED ORDER — RISPERIDONE 1 MG PO TABS
1.0000 mg | ORAL_TABLET | Freq: Every day | ORAL | Status: DC
  Administered 2024-05-25 – 2024-06-05 (×12): 1 mg via ORAL
  Filled 2024-05-25 (×12): qty 1

## 2024-05-25 NOTE — B[facility] Counselor (Signed)
 CSW spoke with the patient's guardian.  Guardian reports that she has received a 30 day notice for the patient to vacate his group home.  She reports that she received the letter on 05/24/2024, however, she reports that she has not opened the letter to confirm that another date has been provided.   She reports that she believes the patient sees [facility].   She asks that CSW call to confirm with Javon.  Sherryle Margo, MSW, LCSW 05/25/2024 2:46 PM

## 2024-05-25 NOTE — Progress Notes (Signed)
 [facility] MD Progress Note  05/25/2024 10:16 PM Oshea Percival  MRN:  [id number]  Lance [name] is a 48 y.o. male admitted: Presented to the EDfor 05/20/2024  3:15 PM for Patient arrived with IVC papers. Patient was transported to the crisis center by law enforcement after being found lying in front of tobacco store. Upon contact, he reported hearing voices. SABRA He carries the psychiatric diagnoses of Schizophrenia and has a past medical history of  Chart review significant for diabetes mellitus, hx of pulmonary embolus. Patient is admitted to adult psych unit with Q15 min safety monitoring. Multidisciplinary team approach is offered. Medication management; group/milieu therapy is offered.  Subjective:  Chart reviewed, case discussed in multidisciplinary meeting, patient seen during rounds.   12/11: On interview today, patient is found resting in bed.  He reports daytime drowsiness is improving.  Will decrease Klonopin  to 1 mg once daily in the evening and Risperdal  1 mg at bedtime due to daytime drowsiness.  He reports good sleep and stable appetite.  He endorses depressive symptoms but denies anxiety at this time.  He denies SI/HI/plan and denies hallucinations at time of interview.   12/10: On interview today, patient is noted to be resting in bed with one-to-one sitter in the room.  Patient is noted to be drowsy, but wakes on verbal commands.  He reports drowsiness during the day.  He reports good appetite.  He denies current SI at this time.  He denies HI/plan.  He endorses occasional auditory hallucinations but denies command hallucinations.  He endorses visual hallucinations of seeing faces.  He rates anxiety as 7 out of 10 and depression as 7-8 out of 10 today.  12/9: Patient is noted to be resting in bed with one-to-one sitter in the room.  Patient is noted to be very drowsy.  He was able to wake up for the verbal commands.  He reports that he is feeling out of it.  He reports having abdominal pain  on the left side.  Being oncological patient encouraged to inform the staff if he is constipated.  Patient reports having bowel movements.  Patient denies having any urinary symptoms.  Patient vitals are stable except for low blood pressure.  Patient received increased dose of Klonopin  during this admission 2 mg twice daily.  Patient reports ongoing thoughts of suicide but denies having any active plan.  Patient denies auditory/visual hallucinations.  Discussed the plan to reduce the dosing of Klonopin , remove melatonin and discontinue propranolol  due to low blood pressure.  Past Psychiatric History: see h&P Family History:  Family History  Problem Relation Age of Onset   CAD Mother    Rheum arthritis Mother    Healthy Father    CAD Sister    Obesity Sister    Social History:  Social History   Substance and Sexual Activity  Alcohol Use Not Currently   Comment: occassionally     Social History   Substance and Sexual Activity  Drug Use Yes   Types: Marijuana   Comment: Patient reports occasionally    Social History   Socioeconomic History   Marital status: Single    Spouse name: Not on file   Number of children: Not on file   Years of education: Not on file   Highest education level: Not on file  Occupational History   Not on file  Tobacco Use   Smoking status: Every Day    Current packs/day: 1.00    Average packs/day: 1 pack/day  for 23.0 years (23.0 ttl pk-yrs)    Types: Cigarettes   Smokeless tobacco: Never   Tobacco comments:    will provide material  Vaping Use   Vaping status: Every Day  Substance and Sexual Activity   Alcohol use: Not Currently    Comment: occassionally   Drug use: Yes    Types: Marijuana    Comment: Patient reports occasionally   Sexual activity: Not Currently    Birth control/protection: None    Comment: occasional marijuana- none recently  Other Topics Concern   Not on file  Social History Narrative   From a group home in  [place]   Social Drivers of Health   Tobacco Use: High Risk (05/21/2024)   Patient History    Smoking Tobacco Use: Every Day    Smokeless Tobacco Use: Never    Passive Exposure: Not on file  Financial Resource Strain: Not on file  Food Insecurity: No Food Insecurity (05/21/2024)   Epic    Worried About Programme Researcher, Broadcasting/film/video in the Last Year: Never true    Ran Out of Food in the Last Year: Never true  Transportation Needs: No Transportation Needs (05/21/2024)   Epic    Lack of Transportation (Medical): No    Lack of Transportation (Non-Medical): No  Physical Activity: Not on file  Stress: Not on file  Social Connections: Not on file  Depression (EYV7-0): Not on file  Alcohol Screen: Low Risk (05/21/2024)   Alcohol Screen    Last Alcohol Screening Score (AUDIT): 0  Housing: Low Risk (05/21/2024)   Epic    Unable to Pay for Housing in the Last Year: No    Number of Times Moved in the Last Year: 1    Homeless in the Last Year: No  Utilities: Not At Risk (05/21/2024)   Epic    Threatened with loss of utilities: No  Health Literacy: Not on file   Past Medical History:  Past Medical History:  Diagnosis Date   Depression    Diabetes mellitus without complication (HCC)    Hyperlipidemia    Hypertension    Lupus anticoagulant disorder 06/14/2012   as per Dr.pandit's note in dec 2013.   PE (pulmonary thromboembolism) (HCC)    Schizo affective schizophrenia (HCC)    Supratherapeutic INR 11/19/2016   History reviewed. No pertinent surgical history.  Current Medications: Current Facility-Administered Medications  Medication Dose Route Frequency Provider Last Rate Last Admin   acetaminophen  (TYLENOL ) tablet 650 mg  650 mg Oral Q6H PRN [name], [name], NP   650 mg at 05/22/24 1203   albuterol  (VENTOLIN  HFA) 108 (90 Base) MCG/ACT inhaler 2 puff  2 puff Inhalation Q6H PRN [name], [name], NP       alum & mag hydroxide-simeth (MAALOX/MYLANTA) 200-200-20 MG/5ML suspension 30 mL  30 mL Oral  Q4H PRN [name], [name], NP       apixaban  (ELIQUIS ) tablet 2.5 mg  2.5 mg Oral BID [name], [name], NP   2.5 mg at 05/25/24 1707   clonazePAM  (KLONOPIN ) tablet 1 mg  1 mg Oral Daily Stephana Morell L, PA-C   1 mg at 05/25/24 1707   cloZAPine  (CLOZARIL ) tablet 250 mg  250 mg Oral QHS [name], [name], NP   250 mg at 05/25/24 2123   haloperidol  (HALDOL ) tablet 5 mg  5 mg Oral TID PRN [name], [name], NP   5 mg at 05/22/24 1424   And   diphenhydrAMINE  (BENADRYL ) capsule 50 mg  50 mg Oral TID PRN [name], [name], NP   50 mg at 05/22/24 1424   haloperidol  lactate (HALDOL ) injection 5 mg  5 mg Intramuscular TID PRN [name], [name], NP   5 mg at 05/21/24 1541   And   diphenhydrAMINE  (BENADRYL ) injection 50 mg  50 mg Intramuscular TID PRN [name], [name], NP       And   LORazepam  (ATIVAN ) injection 2 mg  2 mg Intramuscular TID PRN [name], [name], NP       haloperidol  lactate (HALDOL ) injection 10 mg  10 mg Intramuscular TID PRN [name], [name], NP       And   diphenhydrAMINE  (BENADRYL ) injection 50 mg  50 mg Intramuscular TID PRN [name], [name], NP   50 mg at 05/21/24 1541   And   LORazepam  (ATIVAN ) injection 2 mg  2 mg Intramuscular TID PRN [name], [name], NP   2 mg at 05/21/24 1540   feeding supplement (GLUCERNA SHAKE) (GLUCERNA SHAKE) liquid 237 mL  237 mL Oral TID BM [name], [name], MD   237 mL at 05/25/24 2106   fluticasone  furoate-vilanterol (BREO ELLIPTA ) 200-25 MCG/ACT 1 puff  1 puff Inhalation Daily [name], [name], NP   1 puff at 05/25/24 1134   hydrOXYzine  (ATARAX ) tablet 25 mg  25 mg Oral Daily PRN [name], [name], NP   25 mg at 05/21/24 1531   lactulose  (CHRONULAC ) 10 GM/15ML solution 20 g  20 g Oral BID PRN [name], [name], MD   20 g at 05/25/24 1533   lisinopril  (ZESTRIL ) tablet 2.5 mg  2.5 mg Oral Daily [name], [name], NP   2.5 mg at 05/25/24 0932   lithium  carbonate (ESKALITH ) ER tablet 450 mg  450 mg Oral QHS [name], [name], NP   450 mg at 05/25/24 2120   metFORMIN  (GLUCOPHAGE ) tablet 1,000  mg  1,000 mg Oral BID WC [name], [name], NP   1,000 mg at 05/25/24 1708   risperiDONE  (RISPERDAL ) tablet 1 mg  1 mg Oral QHS Fani Rotondo L, PA-C   1 mg at 05/25/24 2124   simvastatin  (ZOCOR ) tablet 40 mg  40 mg Oral q1800 [name], [name], MD   40 mg at 05/25/24 1707   traZODone  (DESYREL ) tablet 100 mg  100 mg Oral QHS [name], [name], NP   100 mg at 05/24/24 2153    Lab Results:  No results found for this or any previous visit (from the past 48 hours).   Blood Alcohol level:  Lab Results  Component Value Date   [facility] <15 05/20/2024   ETH <15 05/19/2024    Metabolic Disorder Labs: Lab Results  Component Value Date   HGBA1C 5.7 (H) 05/22/2024   MPG 116.89 05/22/2024   MPG 93.93 09/10/2023   Lab Results  Component Value Date   PROLACTIN 2.0 (L) 08/31/2016   PROLACTIN 4.8 05/22/2016   Lab Results  Component Value Date   CHOL 160 05/22/2024   TRIG 310 (H) 05/22/2024   HDL 36 (L) 05/22/2024   CHOLHDL 4.4 05/22/2024   VLDL 62 (H) 05/22/2024   LDLCALC 62 05/22/2024   LDLCALC 68 05/02/2024    Physical Findings: AIMS:  , ,  ,  ,    CIWA:    COWS:      Psychiatric Specialty Exam:  Presentation  General Appearance:  Disheveled  Eye Contact: Poor  Speech: Garbled  Speech Volume: Normal    Mood and Affect  Mood: Dysphoric  Affect: Depressed   Thought Process  Thought Processes: Coherent  Orientation:Full (Time, Place and Person)  Thought Content:WDL  Hallucinations: Denies  Ideas of Reference:None  Suicidal Thoughts: Denies  Homicidal Thoughts: Denies   Sensorium  Memory: Immediate Fair; Recent Fair  Judgment: Poor  Insight: Poor   Executive Functions  Concentration: Fair  Attention Span: Fair  Recall: [employer] of Knowledge: Fair  Language: Fair   Psychomotor Activity  Psychomotor Activity: Normal  Musculoskeletal: Strength & Muscle Tone: decreased Gait & Station: unsteady Assets  Assets: Investment Banker, Corporate; Desire for Improvement    Physical Exam: Physical Exam Vitals and nursing note reviewed.    ROS Blood pressure 117/72, pulse 93, temperature 98.6 F (37 C), temperature source Oral, resp. rate 18, height 5' 10 (1.778 m), weight 86.3 kg, SpO2 99%. Body mass index is 27.3 kg/m.  Diagnosis: Principal Problem:   Schizophrenia, paranoid (HCC)   reatment Plan Summary:  Safety and Monitoring:             -- Involuntary admission to inpatient psychiatric unit for safety, stabilization and treatment             -- Daily contact with patient to assess and evaluate symptoms and progress in treatment             -- Patient's case to be discussed in multi-disciplinary team meeting             -- Observation Level: q15 minute checks             -- Vital signs:  q12 hours             -- Precautions: suicide, elopement, and assault   2. Psychiatric Diagnoses and Treatment:               Reduce Klonopin  to 1mg  once daily  Clozaril  250 mg daily at bedtime Lithium  ER 450 mg daily at bedtime  Melatonin 5 mg daily at bedtime- discontinued due to excessive sedation and fall Propanolol 20 mg at bedtime- discontinued due to excessive sedation and fall Reduce Risperidone  to 1 mg at bedtime Trazodone  100 mg daily at bedtime   -- The risks/benefits/side-effects/alternatives to this medication were discussed in detail with the patient and time was given for questions. The patient consents to medication trial.                -- Metabolic profile and EKG monitoring obtained while on an atypical antipsychotic (BMI: Lipid Panel: HbgA1c: QTc:)              -- Encouraged patient to participate in unit milieu and in scheduled group therapies                            3. Medical Issues Being Addressed:  No acute concerns. Lactulose  for constipation.   4. Discharge Planning:   -- Social work and case management to assist with discharge planning and identification of hospital follow-up needs  prior to discharge  -- Estimated LOS: 5-7 days Case discussed with attending physician Dr. [name], who is in agreement with plan.  Camelia LITTIE Lukes, PA-C 05/25/2024, 10:16 PM

## 2024-05-25 NOTE — Group Note (Signed)
 Date:  05/25/2024 Time:  9:11 PM  Group Topic/Focus:  Self Care:   The focus of this group is to help patients understand the importance of self-care in order to improve or restore emotional, physical, spiritual, interpersonal, and financial health.    Pt did not attend group.  [name] 05/25/2024, 9:11 PM

## 2024-05-25 NOTE — Group Note (Signed)
 Recreation Therapy Group Note   Group Topic:General Recreation  Group Date: 05/25/2024 Start Time: 1500 End Time: 1545 Facilitators: Celestia Jeoffrey BRAVO, LRT, CTRS Location: Courtyard  Group Description: [employer]. LRT and patients played games of basketball, drew with chalk, and played corn hole while outside in the courtyard while getting fresh air and sunlight. Music was being played in the background. LRT and peers conversed about different games they have played before, what they do in their free time and anything else that is on their minds. LRT encouraged pts to drink water after being outside, sweating and getting their heart rate up.  Goal Area(s) Addressed: Patient will build on frustration tolerance skills. Patients will partake in a competitive play game with peers. Patients will gain knowledge of new leisure interest/hobby.    Affect/Mood: Appropriate   Participation Level: Engaged   Participation Quality: Independent   Behavior: Appropriate   Speech/Thought Process: Coherent   Insight: Fair   Judgement: Fair    Modes of Intervention: Activity, Education, and Guided Discussion   Patient Response to Interventions:  Receptive   Education Outcome:  Acknowledges education   Clinical Observations/Individualized Feedback: Jerelle was active in their participation of session activities and group discussion. Pt interacted well with LRT and peers duration of session.    Plan: Continue to engage patient in RT group sessions 2-3x/week.   Jeoffrey BRAVO Celestia, LRT, CTRS 05/25/2024 5:05 PM

## 2024-05-25 NOTE — Progress Notes (Signed)
°   05/25/24 2110  Vital Signs  Temp 98.6 F (37 C)  Temp Source Oral  Pulse Rate 93  Pulse Rate Source Monitor  BP 117/72  BP Location Left Arm  BP Method Automatic  Patient Position (if appropriate) Sitting  Oxygen Therapy  SpO2 99 %  O2 Device Room Air    During report it was reported pt. Was lethargic this morning. Upon assessment this evening he was in bed. I spoke with him and he came to the med room using his walker. I took his vitals-WNL. I gave all night meds except the Trazodone  at this time. I told him he needed the trazodone  later, I will access and make a decision to administer at that time. Patient reported no HI or SI. Verbally contracted for safety.

## 2024-05-25 NOTE — Plan of Care (Signed)
   Problem: Education: Goal: Emotional status will improve Outcome: Progressing

## 2024-05-25 NOTE — Progress Notes (Signed)
°   05/25/24 1100  Psych Admission Type (Psych Patients Only)  Admission Status Voluntary  Psychosocial Assessment  Patient Complaints Depression  Eye Contact Brief  Facial Expression Flat  Affect Sad  Speech Soft  Interaction Minimal  Motor Activity Slow  Appearance/Hygiene In scrubs  Behavior Characteristics Cooperative  Mood Sad  Aggressive Behavior  Effect No apparent injury  Thought Process  Coherency WDL  Content Ambivalence  Delusions None reported or observed  Perception Derealization  Hallucination None reported or observed  Judgment Impaired  Confusion None  Danger to Self  Current suicidal ideation? Denies  Agreement Not to Harm Self Yes  Description of Agreement Verbal  Danger to Others  Danger to Others None reported or observed

## 2024-05-25 NOTE — Plan of Care (Signed)

## 2024-05-25 NOTE — Progress Notes (Signed)
 1:1 sitter has been discontinued per Dr. JINNY.

## 2024-05-25 NOTE — Group Note (Signed)
 Date:  05/25/2024 Time:  11:07 AM  Group Topic/Focus:  Goals Group:   The focus of this group is to help patients establish daily goals to achieve during treatment and discuss how the patient can incorporate goal setting into their daily lives to aide in recovery.    Participation Level:  Active  Participation Quality:  Appropriate  Affect:  Appropriate  Cognitive:  Appropriate  Insight: Appropriate  Engagement in Group:  Engaged  Modes of Intervention:  Activity  Additional Comments:    [name] Latoya Maulding 05/25/2024, 11:07 AM

## 2024-05-25 NOTE — Group Note (Signed)
 [facility] LCSW Group Therapy Note   Group Date: 05/25/2024 Start Time: 1300 End Time: 1400   Type of Therapy/Topic:  Group Therapy:  Emotion Regulation  Participation Level:  Did Not Attend   Mood:  Description of Group:    The purpose of this group is to assist patients in learning to regulate negative emotions and experience positive emotions. Patients will be guided to discuss ways in which they have been vulnerable to their negative emotions. These vulnerabilities will be juxtaposed with experiences of positive emotions or situations, and patients challenged to use positive emotions to combat negative ones. Special emphasis will be placed on coping with negative emotions in conflict situations, and patients will process healthy conflict resolution skills.  Therapeutic Goals: Patient will identify two positive emotions or experiences to reflect on in order to balance out negative emotions:  Patient will label two or more emotions that they find the most difficult to experience:  Patient will be able to demonstrate positive conflict resolution skills through discussion or role plays:   Summary of Patient Progress:   Patient did not attend.     Therapeutic Modalities:   Cognitive Behavioral Therapy Feelings Identification Dialectical Behavioral Therapy   Alveta CHRISTELLA Kerns, LCSW

## 2024-05-25 NOTE — Group Note (Signed)

## 2024-05-25 NOTE — Progress Notes (Signed)
°   05/25/24 0000  Psych Admission Type (Psych Patients Only)  Admission Status Voluntary  Psychosocial Assessment  Patient Complaints Anhedonia  Eye Contact Brief  Facial Expression Flat  Affect Sad  Speech Soft  Interaction Minimal  Motor Activity Slow  Appearance/Hygiene In scrubs  Behavior Characteristics Cooperative  Mood Sad  Aggressive Behavior  Effect No apparent injury  Thought Process  Coherency WDL  Content WDL  Delusions None reported or observed  Perception Hallucinations  Hallucination Auditory  Judgment Impaired  Confusion None  Danger to Self  Current suicidal ideation? Denies  Agreement Not to Harm Self Yes  Danger to Others  Danger to Others None reported or observed

## 2024-05-26 ENCOUNTER — Ambulatory Visit: Payer: Self-pay | Admitting: Student-PharmD

## 2024-05-26 DIAGNOSIS — F200 Paranoid schizophrenia: Secondary | ICD-10-CM | POA: Diagnosis not present

## 2024-05-26 LAB — CBC WITH DIFFERENTIAL/PLATELET
Abs Immature Granulocytes: 0.04 K/uL (ref 0.00–0.07)
Basophils Absolute: 0 K/uL (ref 0.0–0.1)
Basophils Relative: 0 %
Eosinophils Absolute: 0 K/uL (ref 0.0–0.5)
Eosinophils Relative: 0 %
HCT: 42.3 % (ref 39.0–52.0)
Hemoglobin: 14.4 g/dL (ref 13.0–17.0)
Immature Granulocytes: 1 %
Lymphocytes Relative: 27 %
Lymphs Abs: 1.2 K/uL (ref 0.7–4.0)
MCH: 31.2 pg (ref 26.0–34.0)
MCHC: 34 g/dL (ref 30.0–36.0)
MCV: 91.8 fL (ref 80.0–100.0)
Monocytes Absolute: 0.5 K/uL (ref 0.1–1.0)
Monocytes Relative: 11 %
Neutro Abs: 2.7 K/uL (ref 1.7–7.7)
Neutrophils Relative %: 61 %
Platelets: 218 K/uL (ref 150–400)
RBC: 4.61 MIL/uL (ref 4.22–5.81)
RDW: 12.2 % (ref 11.5–15.5)
WBC: 4.4 K/uL (ref 4.0–10.5)
nRBC: 0 % (ref 0.0–0.2)

## 2024-05-26 NOTE — Plan of Care (Signed)
 Lance [name] is a 48 y.o. male patient. No diagnosis found. Past Medical History:  Diagnosis Date   Depression    Diabetes mellitus without complication (HCC)    Hyperlipidemia    Hypertension    Lupus anticoagulant disorder 06/14/2012   as per Dr.pandit's note in dec 2013.   PE (pulmonary thromboembolism) (HCC)    Schizo affective schizophrenia (HCC)    Supratherapeutic INR 11/19/2016   Current Facility-Administered Medications  Medication Dose Route Frequency Provider Last Rate Last Admin   acetaminophen  (TYLENOL ) tablet 650 mg  650 mg Oral Q6H PRN [name], [name], NP   650 mg at 05/22/24 1203   albuterol  (VENTOLIN  HFA) 108 (90 Base) MCG/ACT inhaler 2 puff  2 puff Inhalation Q6H PRN [name], [name], NP       alum & mag hydroxide-simeth (MAALOX/MYLANTA) 200-200-20 MG/5ML suspension 30 mL  30 mL Oral Q4H PRN [name], [name], NP       apixaban  (ELIQUIS ) tablet 2.5 mg  2.5 mg Oral BID [name], [name], NP   2.5 mg at 05/25/24 1707   clonazePAM  (KLONOPIN ) tablet 1 mg  1 mg Oral Daily [name], [name], PA-C   1 mg at 05/25/24 1707   cloZAPine  (CLOZARIL ) tablet 250 mg  250 mg Oral QHS [name], [name], NP   250 mg at 05/25/24 2123   haloperidol  (HALDOL ) tablet 5 mg  5 mg Oral TID PRN [name], [name], NP   5 mg at 05/22/24 1424   And   diphenhydrAMINE  (BENADRYL ) capsule 50 mg  50 mg Oral TID PRN [name], [name], NP   50 mg at 05/22/24 1424   haloperidol  lactate (HALDOL ) injection 5 mg  5 mg Intramuscular TID PRN [name], [name], NP   5 mg at 05/21/24 1541   And   diphenhydrAMINE  (BENADRYL ) injection 50 mg  50 mg Intramuscular TID PRN [name], [name], NP       And   LORazepam  (ATIVAN ) injection 2 mg  2 mg Intramuscular TID PRN [name], [name], NP       haloperidol  lactate (HALDOL ) injection 10 mg  10 mg Intramuscular TID PRN [name], [name], NP       And   diphenhydrAMINE  (BENADRYL ) injection 50 mg  50 mg Intramuscular TID PRN [name], [name], NP   50 mg at 05/21/24 1541   And   LORazepam  (ATIVAN )  injection 2 mg  2 mg Intramuscular TID PRN [name], [name], NP   2 mg at 05/21/24 1540   feeding supplement (GLUCERNA SHAKE) (GLUCERNA SHAKE) liquid 237 mL  237 mL Oral TID BM [name], [name], MD   237 mL at 05/25/24 2106   fluticasone  furoate-vilanterol (BREO ELLIPTA ) 200-25 MCG/ACT 1 puff  1 puff Inhalation Daily [name], [name], NP   1 puff at 05/25/24 1134   hydrOXYzine  (ATARAX ) tablet 25 mg  25 mg Oral Daily PRN [name], [name], NP   25 mg at 05/21/24 1531   lactulose  (CHRONULAC ) 10 GM/15ML solution 20 g  20 g Oral BID PRN [name], [name], MD   20 g at 05/25/24 1533   lisinopril  (ZESTRIL ) tablet 2.5 mg  2.5 mg Oral Daily [name], [name], NP   2.5 mg at 05/25/24 0932   lithium  carbonate (ESKALITH ) ER tablet 450 mg  450 mg Oral QHS [name], [name], NP   450 mg at 05/25/24 2120   metFORMIN  (GLUCOPHAGE ) tablet 1,000 mg  1,000 mg Oral BID WC [name], [name], NP  1,000 mg at 05/25/24 1708   risperiDONE  (RISPERDAL ) tablet 1 mg  1 mg Oral QHS [name], [name], PA-C   1 mg at 05/25/24 2124   simvastatin  (ZOCOR ) tablet 40 mg  40 mg Oral q1800 [name], [name], MD   40 mg at 05/25/24 1707   traZODone  (DESYREL ) tablet 100 mg  100 mg Oral QHS [name], [name], NP   100 mg at 05/24/24 2153   Allergies[1] Principal Problem:   Schizophrenia, paranoid (HCC)  Blood pressure 117/72, pulse 93, temperature 98.6 F (37 C), temperature source Oral, resp. rate 18, height 5' 10 (1.778 m), weight 86.3 kg, SpO2 99%.   Traeh Milroy B Yareth Kearse 05/26/2024      [1]  Allergies Allergen Reactions   Penicillins Other (See Comments)    Reaction: lockjaw as teen Tolerated Augmentin /Unasyn  06/09/22

## 2024-05-26 NOTE — [facility] Counselor (Signed)
 CSW spoke with Javon [phone], with the patient's group home.  He reports that patient sees Jacques Daring at [facility]  CSW attempted to call and get appointment scheduled.    CSW left message requesting a callback.  Sherryle Margo, MSW, LCSW 05/26/2024 1:10 PM

## 2024-05-26 NOTE — Progress Notes (Cosign Needed Addendum)
 [facility] MD Progress Note  05/26/2024 10:55 PM Lance [name]   MRN:  [id number]  Otniel Hoe is a 48 y.o. male admitted: Presented to the EDfor 05/20/2024  3:15 PM for Patient arrived with IVC papers. Patient was transported to the crisis center by law enforcement after being found lying in front of tobacco store. Upon contact, he reported hearing voices. SABRA He carries the psychiatric diagnoses of Schizophrenia and has a past medical history of  Chart review significant for diabetes mellitus, hx of pulmonary embolus. Patient is admitted to adult psych unit with Q15 min safety monitoring. Multidisciplinary team approach is offered. Medication management; group/milieu therapy is offered.  Subjective:  Chart reviewed, case discussed in multidisciplinary meeting, patient seen during rounds.   12/12: On interview today, patient is noted to be more awake and alert.  He reports continued improvement in daytime tiredness.  He is noted to be more interactive on the unit.  He is tolerating medication regimen well.  He rates depression as 7 out of 10 and anxiety at 8 out of 10.  He denies SI/HI/plan.  He denies hallucinations at time of interview.  Will consider lithium  dose increase pending renal labs, ordered today.  Reports having bowel movement yesterday.  12/11: On interview today, patient is found resting in bed.  He reports daytime drowsiness is improving.  Will decrease Klonopin  to 1 mg once daily in the evening and Risperdal  1 mg at bedtime due to daytime drowsiness.  He reports good sleep and stable appetite.  He endorses depressive symptoms but denies anxiety at this time.  He denies SI/HI/plan and denies hallucinations at time of interview.   12/10: On interview today, patient is noted to be resting in bed with one-to-one sitter in the room.  Patient is noted to be drowsy, but wakes on verbal commands.  He reports drowsiness during the day.  He reports good appetite.  He denies current SI at this  time.  He denies HI/plan.  He endorses occasional auditory hallucinations but denies command hallucinations.  He endorses visual hallucinations of seeing faces.  He rates anxiety as 7 out of 10 and depression as 7-8 out of 10 today.  12/9: Patient is noted to be resting in bed with one-to-one sitter in the room.  Patient is noted to be very drowsy.  He was able to wake up for the verbal commands.  He reports that he is feeling out of it.  He reports having abdominal pain on the left side.  Being oncological patient encouraged to inform the staff if he is constipated.  Patient reports having bowel movements.  Patient denies having any urinary symptoms.  Patient vitals are stable except for low blood pressure.  Patient received increased dose of Klonopin  during this admission 2 mg twice daily.  Patient reports ongoing thoughts of suicide but denies having any active plan.  Patient denies auditory/visual hallucinations.  Discussed the plan to reduce the dosing of Klonopin , remove melatonin and discontinue propranolol  due to low blood pressure.  Past Psychiatric History: see h&P Family History:  Family History  Problem Relation Age of Onset   CAD Mother    Rheum arthritis Mother    Healthy Father    CAD Sister    Obesity Sister    Social History:  Social History   Substance and Sexual Activity  Alcohol Use Not Currently   Comment: occassionally     Social History   Substance and Sexual Activity  Drug Use Yes  Types: Marijuana   Comment: Patient reports occasionally    Social History   Socioeconomic History   Marital status: Single    Spouse name: Not on file   Number of children: Not on file   Years of education: Not on file   Highest education level: Not on file  Occupational History   Not on file  Tobacco Use   Smoking status: Every Day    Current packs/day: 1.00    Average packs/day: 1 pack/day for 23.0 years (23.0 ttl pk-yrs)    Types: Cigarettes   Smokeless tobacco:  Never   Tobacco comments:    will provide material  Vaping Use   Vaping status: Every Day  Substance and Sexual Activity   Alcohol use: Not Currently    Comment: occassionally   Drug use: Yes    Types: Marijuana    Comment: Patient reports occasionally   Sexual activity: Not Currently    Birth control/protection: None    Comment: occasional marijuana- none recently  Other Topics Concern   Not on file  Social History Narrative   From a group home in [place]   Social Drivers of Health   Tobacco Use: High Risk (05/21/2024)   Patient History    Smoking Tobacco Use: Every Day    Smokeless Tobacco Use: Never    Passive Exposure: Not on file  Financial Resource Strain: Not on file  Food Insecurity: No Food Insecurity (05/21/2024)   Epic    Worried About Programme Researcher, Broadcasting/film/video in the Last Year: Never true    Ran Out of Food in the Last Year: Never true  Transportation Needs: No Transportation Needs (05/21/2024)   Epic    Lack of Transportation (Medical): No    Lack of Transportation (Non-Medical): No  Physical Activity: Not on file  Stress: Not on file  Social Connections: Not on file  Depression (EYV7-0): Not on file  Alcohol Screen: Low Risk (05/21/2024)   Alcohol Screen    Last Alcohol Screening Score (AUDIT): 0  Housing: Low Risk (05/21/2024)   Epic    Unable to Pay for Housing in the Last Year: No    Number of Times Moved in the Last Year: 1    Homeless in the Last Year: No  Utilities: Not At Risk (05/21/2024)   Epic    Threatened with loss of utilities: No  Health Literacy: Not on file   Past Medical History:  Past Medical History:  Diagnosis Date   Depression    Diabetes mellitus without complication (HCC)    Hyperlipidemia    Hypertension    Lupus anticoagulant disorder 06/14/2012   as per Dr.pandit's note in dec 2013.   PE (pulmonary thromboembolism) (HCC)    Schizo affective schizophrenia (HCC)    Supratherapeutic INR 11/19/2016   History reviewed. No  pertinent surgical history.  Current Medications: Current Facility-Administered Medications  Medication Dose Route Frequency Provider Last Rate Last Admin   acetaminophen  (TYLENOL ) tablet 650 mg  650 mg Oral Q6H PRN [name], [name], NP   650 mg at 05/22/24 1203   albuterol  (VENTOLIN  HFA) 108 (90 Base) MCG/ACT inhaler 2 puff  2 puff Inhalation Q6H PRN [name], [name], NP       alum & mag hydroxide-simeth (MAALOX/MYLANTA) 200-200-20 MG/5ML suspension 30 mL  30 mL Oral Q4H PRN [name], [name], NP       apixaban  (ELIQUIS ) tablet 2.5 mg  2.5 mg Oral BID [name], [name], NP   2.5 mg  at 05/26/24 1638   clonazePAM  (KLONOPIN ) tablet 1 mg  1 mg Oral Daily Loura Pitt L, PA-C   1 mg at 05/26/24 1638   cloZAPine  (CLOZARIL ) tablet 250 mg  250 mg Oral QHS [name], [name], NP   250 mg at 05/26/24 2109   haloperidol  (HALDOL ) tablet 5 mg  5 mg Oral TID PRN [name], [name], NP   5 mg at 05/22/24 1424   And   diphenhydrAMINE  (BENADRYL ) capsule 50 mg  50 mg Oral TID PRN [name], [name], NP   50 mg at 05/22/24 1424   haloperidol  lactate (HALDOL ) injection 5 mg  5 mg Intramuscular TID PRN [name], [name], NP   5 mg at 05/21/24 1541   And   diphenhydrAMINE  (BENADRYL ) injection 50 mg  50 mg Intramuscular TID PRN [name], [name], NP       And   LORazepam  (ATIVAN ) injection 2 mg  2 mg Intramuscular TID PRN [name], [name], NP       haloperidol  lactate (HALDOL ) injection 10 mg  10 mg Intramuscular TID PRN [name], [name], NP       And   diphenhydrAMINE  (BENADRYL ) injection 50 mg  50 mg Intramuscular TID PRN [name], [name], NP   50 mg at 05/21/24 1541   And   LORazepam  (ATIVAN ) injection 2 mg  2 mg Intramuscular TID PRN [name], [name], NP   2 mg at 05/21/24 1540   feeding supplement (GLUCERNA SHAKE) (GLUCERNA SHAKE) liquid 237 mL  237 mL Oral TID BM [name], [name], MD   237 mL at 05/26/24 2112   fluticasone  furoate-vilanterol (BREO ELLIPTA ) 200-25 MCG/ACT 1 puff  1 puff Inhalation Daily [name], [name], NP   1 puff at  05/25/24 1134   hydrOXYzine  (ATARAX ) tablet 25 mg  25 mg Oral Daily PRN [name], [name], NP   25 mg at 05/26/24 1421   lactulose  (CHRONULAC ) 10 GM/15ML solution 20 g  20 g Oral BID PRN [name], [name], MD   20 g at 05/25/24 1533   lisinopril  (ZESTRIL ) tablet 2.5 mg  2.5 mg Oral Daily [name], [name], NP   2.5 mg at 05/26/24 9077   lithium  carbonate (ESKALITH ) ER tablet 450 mg  450 mg Oral QHS [name], [name], NP   450 mg at 05/26/24 2109   metFORMIN  (GLUCOPHAGE ) tablet 1,000 mg  1,000 mg Oral BID WC [name], [name], NP   1,000 mg at 05/26/24 1638   risperiDONE  (RISPERDAL ) tablet 1 mg  1 mg Oral QHS Jemina Scahill L, PA-C   1 mg at 05/26/24 2109   simvastatin  (ZOCOR ) tablet 40 mg  40 mg Oral q1800 [name], [name], MD   40 mg at 05/26/24 1639   traZODone  (DESYREL ) tablet 100 mg  100 mg Oral QHS [name], [name], NP   100 mg at 05/24/24 2153    Lab Results:  Results for orders placed or performed during the hospital encounter of 05/21/24 (from the past 48 hours)  CBC with Differential/Platelet     Status: None   Collection Time: 05/26/24 12:11 PM  Result Value Ref Range   WBC 4.4 4.0 - 10.5 K/uL   RBC 4.61 4.22 - 5.81 MIL/uL   Hemoglobin 14.4 13.0 - 17.0 g/dL   HCT 57.6 60.9 - 47.9 %   MCV 91.8 80.0 - 100.0 fL   MCH 31.2 26.0 - 34.0 pg   MCHC 34.0 30.0 - 36.0 g/dL   RDW 87.7 88.4 -  15.5 %   Platelets 218 150 - 400 K/uL   nRBC 0.0 0.0 - 0.2 %   Neutrophils Relative % 61 %   Neutro Abs 2.7 1.7 - 7.7 K/uL   Lymphocytes Relative 27 %   Lymphs Abs 1.2 0.7 - 4.0 K/uL   Monocytes Relative 11 %   Monocytes Absolute 0.5 0.1 - 1.0 K/uL   Eosinophils Relative 0 %   Eosinophils Absolute 0.0 0.0 - 0.5 K/uL   Basophils Relative 0 %   Basophils Absolute 0.0 0.0 - 0.1 K/uL   Immature Granulocytes 1 %   Abs Immature Granulocytes 0.04 0.00 - 0.07 K/uL    Comment: Performed at [facility], [address] [place], [place] [address]     Blood Alcohol level:  Lab Results  Component Value  Date   [facility] <15 05/20/2024   ETH <15 05/19/2024    Metabolic Disorder Labs: Lab Results  Component Value Date   HGBA1C 5.7 (H) 05/22/2024   MPG 116.89 05/22/2024   MPG 93.93 09/10/2023   Lab Results  Component Value Date   PROLACTIN 2.0 (L) 08/31/2016   PROLACTIN 4.8 05/22/2016   Lab Results  Component Value Date   CHOL 160 05/22/2024   TRIG 310 (H) 05/22/2024   HDL 36 (L) 05/22/2024   CHOLHDL 4.4 05/22/2024   VLDL 62 (H) 05/22/2024   LDLCALC 62 05/22/2024   LDLCALC 68 05/02/2024    Physical Findings: AIMS:  , ,  ,  ,    CIWA:    COWS:      Psychiatric Specialty Exam:  Presentation  General Appearance:  Disheveled  Eye Contact: Fair  Speech: Garbled  Speech Volume: Normal    Mood and Affect  Mood: Dysphoric  Affect: Depressed   Thought Process  Thought Processes: Coherent  Orientation:Full (Time, Place and Person)  Thought Content:WDL  Hallucinations: Denies  Ideas of Reference:None  Suicidal Thoughts: Denies  Homicidal Thoughts: Denies   Sensorium  Memory: Immediate Fair; Recent Fair  Judgment: Poor  Insight: Poor   Art Therapist  Concentration: Fair  Attention Span: Fair  Recall: [employer] of Knowledge: Fair  Language: Fair   Psychomotor Activity  Psychomotor Activity: Normal  Musculoskeletal: Strength & Muscle Tone: decreased Gait & Station: unsteady Assets  Assets: Manufacturing Systems Engineer; Desire for Improvement    Physical Exam: Physical Exam Vitals and nursing note reviewed.    ROS Blood pressure 139/84, pulse 86, temperature 98.1 F (36.7 C), resp. rate 17, height 5' 10 (1.778 m), weight 86.3 kg, SpO2 100%. Body mass index is 27.3 kg/m.  Diagnosis: Principal Problem:   Schizophrenia, paranoid (HCC)   reatment Plan Summary:  Safety and Monitoring:             -- Involuntary admission to inpatient psychiatric unit for safety, stabilization and treatment             -- Daily  contact with patient to assess and evaluate symptoms and progress in treatment             -- Patient's case to be discussed in multi-disciplinary team meeting             -- Observation Level: q15 minute checks             -- Vital signs:  q12 hours             -- Precautions: suicide, elopement, and assault   2. Psychiatric Diagnoses and Treatment:  Reduces Klonopin  to 1mg  once daily  Clozaril  250 mg daily at bedtime Lithium  ER 450 mg daily at bedtime - Renal function labs ordered today Melatonin 5 mg daily at bedtime- discontinued due to excessive sedation and fall Propanolol 20 mg at bedtime- discontinued due to excessive sedation and fall Reduces Risperidone  to 1 mg at bedtime Trazodone  100 mg daily at bedtime   -- The risks/benefits/side-effects/alternatives to this medication were discussed in detail with the patient and time was given for questions. The patient consents to medication trial.                -- Metabolic profile and EKG monitoring obtained while on an atypical antipsychotic (BMI: Lipid Panel: HbgA1c: QTc:)              -- Encouraged patient to participate in unit milieu and in scheduled group therapies                            3. Medical Issues Being Addressed:  No acute concerns. Lactulose  for constipation.   4. Discharge Planning:   -- Social work and case management to assist with discharge planning and identification of hospital follow-up needs prior to discharge  -- Estimated LOS: 5-7 days Case discussed with attending physician Dr. [name], who is in agreement with plan.  Camelia LITTIE Lukes, PA-C 05/26/2024, 10:55 PM

## 2024-05-26 NOTE — Group Note (Signed)
 Recreation Therapy Group Note   Group Topic:Stress Management  Group Date: 05/26/2024 Start Time: 1530 End Time: 1605 Facilitators: Celestia Jeoffrey BRAVO, LRT, CTRS Location: Dayroom  Group Description: Yoga. LRT and patients discussed the benefits of yoga and how it differs from strength exercises. LRT educated patients on the mental and physical benefits of yoga and deep breathing and how it can be used as a associate professor. LRT instructed patients on different stretching and yoga poses to complete that focused on all parts of the body, as well as deep breathing. Pt encouraged to stop movement at any time if they feel discomfort or pain.   Goal Area(s) Addressed: Patient will practice using relaxation technique. Patient will identify a new coping skill.  Patient will follow multistep directions to reduce anxiety and stress.   Affect/Mood: N/A   Participation Level: Did not attend    Clinical Observations/Individualized Feedback: Patient did not attend group.   Plan: Continue to engage patient in RT group sessions 2-3x/week.   Jeoffrey BRAVO Celestia, LRT, CTRS 05/26/2024 5:08 PM

## 2024-05-26 NOTE — Group Note (Signed)
 Date:  05/26/2024 Time:  8:25 PM  Group Topic/Focus:  Making Healthy Choices:   The focus of this group is to help patients identify negative/unhealthy choices they were using prior to admission and identify positive/healthier coping strategies to replace them upon discharge. Managing Feelings:   The focus of this group is to identify what feelings patients have difficulty handling and develop a plan to handle them in a healthier way upon discharge. Overcoming Stress:   The focus of this group is to define stress and help patients assess their triggers. Self Care:   The focus of this group is to help patients understand the importance of self-care in order to improve or restore emotional, physical, spiritual, interpersonal, and financial health.    Participation Level:  Active  Participation Quality:  Appropriate and Attentive  Affect:  Appropriate  Cognitive:  Alert, Appropriate, and Oriented  Insight: Appropriate and Good  Engagement in Group:  Engaged  Modes of Intervention:  Discussion and Support  Additional Comments:  N/A  Lance [name] 05/26/2024, 8:25 PM

## 2024-05-26 NOTE — Plan of Care (Signed)
   Problem: Coping: Goal: Ability to verbalize frustrations and anger appropriately will improve Outcome: Progressing

## 2024-05-26 NOTE — Group Note (Signed)
 Date:  05/26/2024 Time:  10:12 AM  Group Topic/Focus:  Identifying Needs:   The focus of this group is to help patients identify their personal needs that have been historically problematic and identify healthy behaviors to address their needs.    Participation Level:  Did Not Attend   Lance [name] Yaqub Arney 05/26/2024, 10:12 AM

## 2024-05-26 NOTE — [facility] Group Notes (Signed)
 Spirituality Group   Description: Participant directed exploration of values, beliefs and meaning   **Focus on Community & Connections/naming sources of support (especially as a response to despair, urge to self-harm). Reflect and locate spiritual aspects (eg, purpose, joy, meaning, sense of self) of connection.   **Group focused on self-compassion as additional supportive and healing method   Following a brief framework of chaplains role and ground rules of group behavior, participants are invited to share concerns or questions that engage spiritual life. Emphasis placed on common themes and shared experiences and ways to make meaning and clarify living into ones values.   Theory/Process/Goal: Utilize the theoretical framework of group therapy established by Celena Kite, Relational Cultural Theory and Rogerian approaches to facilitate relational empathy and use of the here and now to foster reflection, self-awareness, and sharing.   Observations: Lance [name] initially attended group (<0.25) but did not participate.  Lance [name] HERO.Div

## 2024-05-26 NOTE — Group Note (Signed)
 Recreation Therapy Group Note   Group Topic:Leisure Education  Group Date: 05/26/2024 Start Time: 1040 End Time: 1135 Facilitators: Celestia Jeoffrey BRAVO, LRT, CTRS Location: Craft Room   Group Description: Leisure. Patients were given the option to choose from journaling, coloring, drawing, making origami, playing with playdoh, listening to music or singing karaoke. LRT and pts discussed the meaning of leisure, the importance of participating in leisure during their free time/when they're outside of the hospital, as well as how our leisure interests can also serve as coping skills.   Goal Area(s) Addressed:  Patient will identify a current leisure interest.  Patient will learn the definition of leisure. Patient will practice making a positive decision. Patient will have the opportunity to try a new leisure activity. Patient will communicate with peers and LRT.    Affect/Mood: N/A   Participation Level: Did not attend    Clinical Observations/Individualized Feedback: Patient did not attend.  Plan: Continue to engage patient in RT group sessions 2-3x/week.   Jeoffrey BRAVO Celestia, LRT, CTRS 05/26/2024 1:08 PM

## 2024-05-27 DIAGNOSIS — F200 Paranoid schizophrenia: Secondary | ICD-10-CM | POA: Diagnosis not present

## 2024-05-27 LAB — RENAL FUNCTION PANEL
Albumin: 4.2 g/dL (ref 3.5–5.0)
Anion gap: 9 (ref 5–15)
BUN: 27 mg/dL — ABNORMAL HIGH (ref 6–20)
CO2: 25 mmol/L (ref 22–32)
Calcium: 10.1 mg/dL (ref 8.9–10.3)
Chloride: 106 mmol/L (ref 98–111)
Creatinine, Ser: 1.17 mg/dL (ref 0.61–1.24)
GFR, Estimated: >60 mL/min (ref >60)
Glucose, Bld: 177 mg/dL — ABNORMAL HIGH (ref 70–99)
Phosphorus: 4.3 mg/dL (ref 2.5–4.6)
Potassium: 5.1 mmol/L (ref 3.5–5.1)
Sodium: 139 mmol/L (ref 135–145)

## 2024-05-27 NOTE — Group Note (Signed)
 Date:  05/27/2024 Time:  9:46 PM  Group Topic/Focus:  Wrap-Up Group:   The focus of this group is to help patients review their daily goal of treatment and discuss progress on daily workbooks.   Additional Comments:  Didn't Attend  Kerri Katz 05/27/2024, 9:46 PM

## 2024-05-27 NOTE — Group Note (Signed)
 Date:  05/27/2024 Time:  4:08 PM  Group Topic/Focus:  Making Healthy Choices:   The focus of this group is to help patients identify negative/unhealthy choices they were using prior to admission and identify positive/healthier coping strategies to replace them upon discharge.    Participation Level:  Active  Participation Quality:  Appropriate  Affect:  Appropriate  Cognitive:  Appropriate  Insight: Appropriate  Engagement in Group:  Engaged  Modes of Intervention:  Activity and Socialization  Additional Comments:    Deitra Caron Mainland 05/27/2024, 4:08 PM

## 2024-05-27 NOTE — Plan of Care (Signed)
   Problem: Education: Goal: Emotional status will improve Outcome: Progressing   Problem: Education: Goal: Mental status will improve Outcome: Progressing

## 2024-05-27 NOTE — Progress Notes (Signed)
 [facility] MD Progress Note  05/27/2024 1:05 PM Lance [name]  MRN:  [id number]  Lance [name] is a 48 y.o. male admitted: Presented to the EDfor 05/20/2024  3:15 PM for Patient arrived with IVC papers. Patient was transported to the crisis center by law enforcement after being found lying in front of tobacco store. Upon contact, he reported hearing voices. SABRA He carries the psychiatric diagnoses of Schizophrenia and has a past medical history of  Chart review significant for diabetes mellitus, hx of pulmonary embolus. Patient is admitted to adult psych unit with Q15 min safety monitoring. Multidisciplinary team approach is offered. Medication management; group/milieu therapy is offered.  Subjective:  Chart reviewed, case discussed in multidisciplinary meeting, patient seen during rounds.   12/12: On interview today, patient is noted to be more awake and alert.  He reports continued improvement in daytime tiredness.  He is noted to be more interactive on the unit.  He is tolerating medication regimen well.  He rates depression as 7 out of 10 and anxiety at 8 out of 10.  He denies SI/HI/plan.  He denies hallucinations at time of interview.  Will consider lithium  dose increase pending renal labs, ordered today.  Reports having bowel movement yesterday.  12/11: On interview today, patient is found resting in bed.  He reports daytime drowsiness is improving.  Will decrease Klonopin  to 1 mg once daily in the evening and Risperdal  1 mg at bedtime [name] to daytime drowsiness.  He reports good sleep and stable appetite.  He endorses depressive symptoms but denies anxiety at this time.  He denies SI/HI/plan and denies hallucinations at time of interview.   12/10: On interview today, patient is noted to be resting in bed with one-to-one sitter in the room.  Patient is noted to be drowsy, but wakes on verbal commands.  He reports drowsiness during the day.  He reports good appetite.  He denies current SI at this time.   He denies HI/plan.  He endorses occasional auditory hallucinations but denies command hallucinations.  He endorses visual hallucinations of seeing faces.  He rates anxiety as 7 out of 10 and depression as 7-8 out of 10 today.  12/9: Patient is noted to be resting in bed with one-to-one sitter in the room.  Patient is noted to be very drowsy.  He was able to wake up for the verbal commands.  He reports that he is feeling out of it.  He reports having abdominal pain on the left side.  Being oncological patient encouraged to inform the staff if he is constipated.  Patient reports having bowel movements.  Patient denies having any urinary symptoms.  Patient vitals are stable except for low blood pressure.  Patient received increased dose of Klonopin  during this admission 2 mg twice daily.  Patient reports ongoing thoughts of suicide but denies having any active plan.  Patient denies auditory/visual hallucinations.  Discussed the plan to reduce the dosing of Klonopin , remove melatonin and discontinue propranolol  [name] to low blood pressure.  Past Psychiatric History: see h&P Family History:  Family History  Problem Relation Age of Onset   CAD Mother    Rheum arthritis Mother    Healthy Father    CAD Sister    Obesity Sister    Social History:  Social History   Substance and Sexual Activity  Alcohol Use Not Currently   Comment: occassionally     Social History   Substance and Sexual Activity  Drug Use Yes  Types: Marijuana   Comment: Patient reports occasionally    Social History   Socioeconomic History   Marital status: Single    Spouse name: Not on file   Number of children: Not on file   Years of education: Not on file   Highest education level: Not on file  Occupational History   Not on file  Tobacco Use   Smoking status: Every Day    Current packs/day: 1.00    Average packs/day: 1 pack/day for 23.0 years (23.0 ttl pk-yrs)    Types: Cigarettes   Smokeless tobacco: Never    Tobacco comments:    will provide material  Vaping Use   Vaping status: Every Day  Substance and Sexual Activity   Alcohol use: Not Currently    Comment: occassionally   Drug use: Yes    Types: Marijuana    Comment: Patient reports occasionally   Sexual activity: Not Currently    Birth control/protection: None    Comment: occasional marijuana- none recently  Other Topics Concern   Not on file  Social History Narrative   From a group home in [place]   Social Drivers of Health   Tobacco Use: High Risk (05/21/2024)   Patient History    Smoking Tobacco Use: Every Day    Smokeless Tobacco Use: Never    Passive Exposure: Not on file  Financial Resource Strain: Not on file  Food Insecurity: No Food Insecurity (05/21/2024)   Epic    Worried About Programme Researcher, Broadcasting/film/video in the Last Year: Never true    Ran Out of Food in the Last Year: Never true  Transportation Needs: No Transportation Needs (05/21/2024)   Epic    Lack of Transportation (Medical): No    Lack of Transportation (Non-Medical): No  Physical Activity: Not on file  Stress: Not on file  Social Connections: Not on file  Depression (EYV7-0): Not on file  Alcohol Screen: Low Risk (05/21/2024)   Alcohol Screen    Last Alcohol Screening Score (AUDIT): 0  Housing: Low Risk (05/21/2024)   Epic    Unable to Pay for Housing in the Last Year: No    Number of Times Moved in the Last Year: 1    Homeless in the Last Year: No  Utilities: Not At Risk (05/21/2024)   Epic    Threatened with loss of utilities: No  Health Literacy: Not on file   Past Medical History:  Past Medical History:  Diagnosis Date   Depression    Diabetes mellitus without complication (HCC)    Hyperlipidemia    Hypertension    Lupus anticoagulant disorder 06/14/2012   as per Dr.pandit's note in dec 2013.   PE (pulmonary thromboembolism) (HCC)    Schizo affective schizophrenia (HCC)    Supratherapeutic INR 11/19/2016   History reviewed. No pertinent  surgical history.  Current Medications: Current Facility-Administered Medications  Medication Dose Route Frequency Provider Last Rate Last Admin   acetaminophen  (TYLENOL ) tablet 650 mg  650 mg Oral Q6H PRN [name], [name], NP   650 mg at 05/22/24 1203   albuterol  (VENTOLIN  HFA) 108 (90 Base) MCG/ACT inhaler 2 puff  2 puff Inhalation Q6H PRN [name], [name], NP       alum & mag hydroxide-simeth (MAALOX/MYLANTA) 200-200-20 MG/5ML suspension 30 mL  30 mL Oral Q4H PRN [name], [name], NP       apixaban  (ELIQUIS ) tablet 2.5 mg  2.5 mg Oral BID [name], [name], NP   2.5 mg  at 05/27/24 0825   clonazePAM  (KLONOPIN ) tablet 1 mg  1 mg Oral Daily [name], [name], PA-C   1 mg at 05/26/24 1638   cloZAPine  (CLOZARIL ) tablet 250 mg  250 mg Oral QHS [name], [name], NP   250 mg at 05/26/24 2109   haloperidol  (HALDOL ) tablet 5 mg  5 mg Oral TID PRN [name], [name], NP   5 mg at 05/22/24 1424   And   diphenhydrAMINE  (BENADRYL ) capsule 50 mg  50 mg Oral TID PRN [name], [name], NP   50 mg at 05/22/24 1424   haloperidol  lactate (HALDOL ) injection 5 mg  5 mg Intramuscular TID PRN [name], [name], NP   5 mg at 05/21/24 1541   And   diphenhydrAMINE  (BENADRYL ) injection 50 mg  50 mg Intramuscular TID PRN [name], [name], NP       And   LORazepam  (ATIVAN ) injection 2 mg  2 mg Intramuscular TID PRN [name], [name], NP       haloperidol  lactate (HALDOL ) injection 10 mg  10 mg Intramuscular TID PRN [name], [name], NP       And   diphenhydrAMINE  (BENADRYL ) injection 50 mg  50 mg Intramuscular TID PRN [name], [name], NP   50 mg at 05/21/24 1541   And   LORazepam  (ATIVAN ) injection 2 mg  2 mg Intramuscular TID PRN [name], [name], NP   2 mg at 05/21/24 1540   feeding supplement (GLUCERNA SHAKE) (GLUCERNA SHAKE) liquid 237 mL  237 mL Oral TID BM [name], [name], MD   237 mL at 05/26/24 2112   fluticasone  furoate-vilanterol (BREO ELLIPTA ) 200-25 MCG/ACT 1 puff  1 puff Inhalation Daily [name], [name], NP   1 puff at 05/25/24 1134    hydrOXYzine  (ATARAX ) tablet 25 mg  25 mg Oral Daily PRN [name], [name], NP   25 mg at 05/26/24 1421   lactulose  (CHRONULAC ) 10 GM/15ML solution 20 g  20 g Oral BID PRN [name], [name], MD   20 g at 05/25/24 1533   lisinopril  (ZESTRIL ) tablet 2.5 mg  2.5 mg Oral Daily [name], [name], NP   2.5 mg at 05/27/24 9175   lithium  carbonate (ESKALITH ) ER tablet 450 mg  450 mg Oral QHS [name], [name], NP   450 mg at 05/26/24 2109   metFORMIN  (GLUCOPHAGE ) tablet 1,000 mg  1,000 mg Oral BID WC [name], [name], NP   1,000 mg at 05/27/24 0825   risperiDONE  (RISPERDAL ) tablet 1 mg  1 mg Oral QHS [name], [name], PA-C   1 mg at 05/26/24 2109   simvastatin  (ZOCOR ) tablet 40 mg  40 mg Oral q1800 [name], [name], MD   40 mg at 05/26/24 1639   traZODone  (DESYREL ) tablet 100 mg  100 mg Oral QHS [name], [name], NP   100 mg at 05/24/24 2153    Lab Results:  Results for orders placed or performed during the hospital encounter of 05/21/24 (from the past 48 hours)  CBC with Differential/Platelet     Status: None   Collection Time: 05/26/24 12:11 PM  Result Value Ref Range   WBC 4.4 4.0 - 10.5 K/uL   RBC 4.61 4.22 - 5.81 MIL/uL   Hemoglobin 14.4 13.0 - 17.0 g/dL   HCT 57.6 60.9 - 47.9 %   MCV 91.8 80.0 - 100.0 fL   MCH 31.2 26.0 - 34.0 pg   MCHC 34.0 30.0 - 36.0 g/dL   RDW 87.7 88.4 -  15.5 %   Platelets 218 150 - 400 K/uL   nRBC 0.0 0.0 - 0.2 %   Neutrophils Relative % 61 %   Neutro Abs 2.7 1.7 - 7.7 K/uL   Lymphocytes Relative 27 %   Lymphs Abs 1.2 0.7 - 4.0 K/uL   Monocytes Relative 11 %   Monocytes Absolute 0.5 0.1 - 1.0 K/uL   Eosinophils Relative 0 %   Eosinophils Absolute 0.0 0.0 - 0.5 K/uL   Basophils Relative 0 %   Basophils Absolute 0.0 0.0 - 0.1 K/uL   Immature Granulocytes 1 %   Abs Immature Granulocytes 0.04 0.00 - 0.07 K/uL    Comment: Performed at [facility], [address] [place], [place] [address]  Renal function panel     Status: Abnormal   Collection Time: 05/27/24  9:28  AM  Result Value Ref Range   Sodium 139 135 - 145 mmol/L   Potassium 5.1 3.5 - 5.1 mmol/L   Chloride 106 98 - 111 mmol/L   CO2 25 22 - 32 mmol/L   Glucose, Bld 177 (H) 70 - 99 mg/dL    Comment: Glucose reference range applies only to samples taken after fasting for at least 8 hours.   BUN 27 (H) 6 - 20 mg/dL   Creatinine, Ser 8.82 0.61 - 1.24 mg/dL   Calcium 89.8 8.9 - 89.6 mg/dL   Phosphorus 4.3 2.5 - 4.6 mg/dL   Albumin 4.2 3.5 - 5.0 g/dL   GFR, Estimated >39 >39 mL/min    Comment: (NOTE) Calculated using the CKD-EPI Creatinine Equation (2021)    Anion gap 9 5 - 15    Comment: Performed at [facility], [address] [place], [place] [address]     Blood Alcohol level:  Lab Results  Component Value Date   [facility] <15 05/20/2024   ETH <15 05/19/2024    Metabolic Disorder Labs: Lab Results  Component Value Date   HGBA1C 5.7 (H) 05/22/2024   MPG 116.89 05/22/2024   MPG 93.93 09/10/2023   Lab Results  Component Value Date   PROLACTIN 2.0 (L) 08/31/2016   PROLACTIN 4.8 05/22/2016   Lab Results  Component Value Date   CHOL 160 05/22/2024   TRIG 310 (H) 05/22/2024   HDL 36 (L) 05/22/2024   CHOLHDL 4.4 05/22/2024   VLDL 62 (H) 05/22/2024   LDLCALC 62 05/22/2024   LDLCALC 68 05/02/2024    Physical Findings: AIMS:  , ,  ,  ,    CIWA:    COWS:      Psychiatric Specialty Exam:  Presentation  General Appearance:  Disheveled  Eye Contact: Fair  Speech: Garbled  Speech Volume: Normal    Mood and Affect  Mood: Dysphoric  Affect: Depressed   Thought Process  Thought Processes: Coherent  Orientation:Full (Time, Place and Person)  Thought Content:WDL  Hallucinations: Denies  Ideas of Reference:None  Suicidal Thoughts: Denies  Homicidal Thoughts: Denies   Sensorium  Memory: Immediate Fair; Recent Fair  Judgment: Poor  Insight: Poor   Art Therapist  Concentration: Fair  Attention  Span: Fair  Recall: [employer] of Knowledge: Fair  Language: Fair   Psychomotor Activity  Psychomotor Activity: Normal  Musculoskeletal: Strength & Muscle Tone: decreased Gait & Station: unsteady Assets  Assets: Manufacturing Systems Engineer; Desire for Improvement    Physical Exam: Physical Exam Vitals and nursing note reviewed.    ROS Blood pressure 120/75, pulse 73, temperature 98 F (36.7 C), resp. rate 18, height  5' 10 (1.778 m), weight 86.3 kg, SpO2 99%. Body mass index is 27.3 kg/m.  Diagnosis: Principal Problem:   Schizophrenia, paranoid (HCC)   reatment Plan Summary:  Safety and Monitoring:             -- Involuntary admission to inpatient psychiatric unit for safety, stabilization and treatment             -- Daily contact with patient to assess and evaluate symptoms and progress in treatment             -- Patient's case to be discussed in multi-disciplinary team meeting             -- Observation Level: q15 minute checks             -- Vital signs:  q12 hours             -- Precautions: suicide, elopement, and assault   2. Psychiatric Diagnoses and Treatment:               Reduces Klonopin  to 1mg  once daily  Clozaril  250 mg daily at bedtime Lithium  ER 450 mg daily at bedtime - Renal function labs ordered today Melatonin 5 mg daily at bedtime- discontinued [name] to excessive sedation and fall Propanolol 20 mg at bedtime- discontinued [name] to excessive sedation and fall Reduces Risperidone  to 1 mg at bedtime Trazodone  100 mg daily at bedtime   -- The risks/benefits/side-effects/alternatives to this medication were discussed in detail with the patient and time was given for questions. The patient consents to medication trial.                -- Metabolic profile and EKG monitoring obtained while on an atypical antipsychotic (BMI: Lipid Panel: HbgA1c: QTc:)              -- Encouraged patient to participate in unit milieu and in scheduled group therapies                             3. Medical Issues Being Addressed:  No acute concerns. Lactulose  for constipation.   4. Discharge Planning:   -- Social work and case management to assist with discharge planning and identification of hospital follow-up needs prior to discharge  -- Estimated LOS: 5-7 days Case discussed with attending physician Dr. [name], who is in agreement with plan.  Osa Campoli, NP 05/27/2024, 1:05 PM

## 2024-05-27 NOTE — Progress Notes (Signed)
°   05/27/24 0609  Psych Admission Type (Psych Patients Only)  Admission Status Voluntary  Psychosocial Assessment  Patient Complaints Anxiety  Eye Contact Brief;Fair  Facial Expression Flat;Anxious  Affect Depressed  Speech Soft  Interaction Minimal  Motor Activity Slow  Appearance/Hygiene Improved  Behavior Characteristics Cooperative;Appropriate to situation  Mood Sad  Thought Process  Coherency WDL  Content WDL  Delusions WDL  Perception WDL  Hallucination None reported or observed  Judgment WDL  Confusion WDL  Danger to Self  Current suicidal ideation? Denies   No distress noted, interacting appropriately with peers and staff, 15 minutes safety checks maintained will continue to monitor.

## 2024-05-27 NOTE — Group Note (Signed)
 Date:  05/27/2024 Time:  10:37 AM  Group Topic/Focus:   Wellness Toolbox:   The focus of this group is to discuss various aspects of wellness, balancing those aspects and exploring ways to increase the ability to experience wellness.  Patients will create a wellness toolbox for use upon discharge.    Participation Level:  Did Not Attend    Lance [name] 05/27/2024, 10:37 AM

## 2024-05-27 NOTE — Progress Notes (Signed)
°   05/27/24 0941  Psych Admission Type (Psych Patients Only)  Admission Status Involuntary  Psychosocial Assessment  Patient Complaints Anxiety  Eye Contact Avoids  Facial Expression Flat;Anxious  Affect Anxious  Speech Soft  Interaction Isolative  Motor Activity Slow  Appearance/Hygiene Improved  Behavior Characteristics Cooperative;Anxious  Mood Anxious  Aggressive Behavior  Effect No apparent injury  Thought Process  Coherency WDL  Content WDL  Delusions None reported or observed  Perception WDL  Hallucination None reported or observed  Judgment Impaired  Confusion WDL  Danger to Self  Current suicidal ideation? Denies  Self-Injurious Behavior No self-injurious ideation or behavior indicators observed or expressed   Agreement Not to Harm Self Yes  Description of Agreement verbal  Danger to Others  Danger to Others None reported or observed

## 2024-05-27 NOTE — BH IP Treatment Plan (Signed)
 Interdisciplinary Treatment and Diagnostic Plan Update  05/27/2024 Time of Session: 1:30pm Lance [name] MRN: [id number]  Principal Diagnosis: Schizophrenia, paranoid (HCC)  Secondary Diagnoses: Principal Problem:   Schizophrenia, paranoid (HCC)   Current Medications:  Current Facility-Administered Medications  Medication Dose Route Frequency Provider Last Rate Last Admin   acetaminophen  (TYLENOL ) tablet 650 mg  650 mg Oral Q6H PRN [name], [name], NP   650 mg at 05/22/24 1203   albuterol  (VENTOLIN  HFA) 108 (90 Base) MCG/ACT inhaler 2 puff  2 puff Inhalation Q6H PRN [name], [name], NP       alum & mag hydroxide-simeth (MAALOX/MYLANTA) 200-200-20 MG/5ML suspension 30 mL  30 mL Oral Q4H PRN [name], [name], NP       apixaban  (ELIQUIS ) tablet 2.5 mg  2.5 mg Oral BID [name], [name], NP   2.5 mg at 05/27/24 0825   clonazePAM  (KLONOPIN ) tablet 1 mg  1 mg Oral Daily [name], [name], PA-C   1 mg at 05/26/24 1638   cloZAPine  (CLOZARIL ) tablet 250 mg  250 mg Oral QHS [name], [name], NP   250 mg at 05/26/24 2109   haloperidol  (HALDOL ) tablet 5 mg  5 mg Oral TID PRN [name], [name], NP   5 mg at 05/22/24 1424   And   diphenhydrAMINE  (BENADRYL ) capsule 50 mg  50 mg Oral TID PRN [name], [name], NP   50 mg at 05/22/24 1424   haloperidol  lactate (HALDOL ) injection 5 mg  5 mg Intramuscular TID PRN [name], [name], NP   5 mg at 05/21/24 1541   And   diphenhydrAMINE  (BENADRYL ) injection 50 mg  50 mg Intramuscular TID PRN [name], [name], NP       And   LORazepam  (ATIVAN ) injection 2 mg  2 mg Intramuscular TID PRN [name], [name], NP       haloperidol  lactate (HALDOL ) injection 10 mg  10 mg Intramuscular TID PRN [name], [name], NP       And   diphenhydrAMINE  (BENADRYL ) injection 50 mg  50 mg Intramuscular TID PRN [name], [name], NP   50 mg at 05/21/24 1541   And   LORazepam  (ATIVAN ) injection 2 mg  2 mg Intramuscular TID PRN [name], [name], NP   2 mg at 05/21/24 1540   feeding supplement (GLUCERNA  SHAKE) (GLUCERNA SHAKE) liquid 237 mL  237 mL Oral TID BM [name], [name], MD   237 mL at 05/27/24 1338   fluticasone  furoate-vilanterol (BREO ELLIPTA ) 200-25 MCG/ACT 1 puff  1 puff Inhalation Daily [name], [name], NP   1 puff at 05/25/24 1134   hydrOXYzine  (ATARAX ) tablet 25 mg  25 mg Oral Daily PRN [name], [name], NP   25 mg at 05/26/24 1421   lactulose  (CHRONULAC ) 10 GM/15ML solution 20 g  20 g Oral BID PRN [name], [name], MD   20 g at 05/25/24 1533   lisinopril  (ZESTRIL ) tablet 2.5 mg  2.5 mg Oral Daily [name], [name], NP   2.5 mg at 05/27/24 9175   lithium  carbonate (ESKALITH ) ER tablet 450 mg  450 mg Oral QHS [name], [name], NP   450 mg at 05/26/24 2109   metFORMIN  (GLUCOPHAGE ) tablet 1,000 mg  1,000 mg Oral BID WC [name], [name], NP   1,000 mg at 05/27/24 0825   risperiDONE  (RISPERDAL ) tablet 1 mg  1 mg Oral QHS [name], [name], PA-C   1 mg at 05/26/24 2109   simvastatin  (ZOCOR ) tablet 40  mg  40 mg Oral q1800 [name], Lance R, MD   40 mg at 05/26/24 1639   traZODone  (DESYREL ) tablet 100 mg  100 mg Oral QHS [name], [name], NP   100 mg at 05/24/24 2153   PTA Medications: Medications Prior to Admission  Medication Sig Dispense Refill Last Dose/Taking   albuterol  (VENTOLIN  HFA) 108 (90 Base) MCG/ACT inhaler Inhale 2 puffs into the lungs every 6 (six) hours as needed for wheezing or shortness of breath.      clonazePAM  (KLONOPIN ) 2 MG tablet Take 1 tablet (2 mg total) by mouth 2 (two) times daily for 15 days. 30 tablet 0    cloZAPine  (CLOZARIL ) 50 MG tablet Take 5 tablets (250 mg total) by mouth at bedtime. 150 tablet 0    ELIQUIS  2.5 MG TABS tablet Take 2.5 mg by mouth 2 (two) times daily.      fluticasone -salmeterol (ADVAIR) 250-50 MCG/ACT AEPB Inhale 1 puff into the lungs 2 (two) times daily. 60 each 1    hydrOXYzine  (ATARAX ) 25 MG tablet Take 1 tablet (25 mg total) by mouth daily as needed for anxiety. 30 tablet 0    lisinopril  (ZESTRIL ) 2.5 MG tablet Take 1 tablet (2.5 mg total) by  mouth daily. 30 tablet 1    lithium  carbonate (ESKALITH ) 450 MG ER tablet Take 1 tablet (450 mg total) by mouth at bedtime. 30 tablet 1    melatonin 5 MG TABS Take 5 mg by mouth at bedtime.      metFORMIN  (GLUCOPHAGE ) 1000 MG tablet Take 1 tablet (1,000 mg total) by mouth 2 (two) times daily with a meal. 60 tablet 1    propranolol  (INDERAL ) 20 MG tablet Take 1 tablet (20 mg total) by mouth at bedtime. 30 tablet 1    risperiDONE  (RISPERDAL ) 1 MG tablet Take 1 mg by mouth 2 (two) times daily.      simvastatin  (ZOCOR ) 40 MG tablet Take 1 tablet (40 mg total) by mouth daily at 6 PM. 30 tablet 1    traZODone  (DESYREL ) 100 MG tablet Take 1 tablet (100 mg total) by mouth at bedtime. 30 tablet 1     Patient Stressors:    Patient Strengths:    Treatment Modalities: Medication Management, Group therapy, Case management,  1 to 1 session with clinician, Psychoeducation, Recreational therapy.   Physician Treatment Plan for Primary Diagnosis: Schizophrenia, paranoid (HCC) Long Term Goal(s): Improvement in symptoms so as ready for discharge   Short Term Goals: Ability to identify changes in lifestyle to reduce recurrence of condition will improve Ability to demonstrate self-control will improve Ability to identify and develop effective coping behaviors will improve Compliance with prescribed medications will improve Ability to verbalize feelings will improve  Medication Management: Evaluate patient's response, side effects, and tolerance of medication regimen.  Therapeutic Interventions: 1 to 1 sessions, Unit Group sessions and Medication administration.  Evaluation of Outcomes: Not Met  Physician Treatment Plan for Secondary Diagnosis: Principal Problem:   Schizophrenia, paranoid (HCC)  Long Term Goal(s): Improvement in symptoms so as ready for discharge   Short Term Goals: Ability to identify changes in lifestyle to reduce recurrence of condition will improve Ability to demonstrate  self-control will improve Ability to identify and develop effective coping behaviors will improve Compliance with prescribed medications will improve Ability to verbalize feelings will improve     Medication Management: Evaluate patient's response, side effects, and tolerance of medication regimen.  Therapeutic Interventions: 1 to 1 sessions, Unit Group sessions and Medication administration.  Evaluation of Outcomes: Not Met   RN Treatment Plan for Primary Diagnosis: Schizophrenia, paranoid (HCC) Long Term Goal(s): Knowledge of disease and therapeutic regimen to maintain health will improve  Short Term Goals: Ability to remain free from injury will improve, Ability to verbalize frustration and anger appropriately will improve, Ability to demonstrate self-control, Ability to participate in decision making will improve, Ability to verbalize feelings will improve, Ability to disclose and discuss suicidal ideas, Ability to identify and develop effective coping behaviors will improve, and Compliance with prescribed medications will improve  Medication Management: RN will administer medications as ordered by provider, will assess and evaluate patient's response and provide education to patient for prescribed medication. RN will report any adverse and/or side effects to prescribing provider.  Therapeutic Interventions: 1 on 1 counseling sessions, Psychoeducation, Medication administration, Evaluate responses to treatment, Monitor vital signs and CBGs as ordered, Perform/monitor CIWA, COWS, AIMS and Fall Risk screenings as ordered, Perform wound care treatments as ordered.  Evaluation of Outcomes: Not Met   LCSW Treatment Plan for Primary Diagnosis: Schizophrenia, paranoid (HCC) Long Term Goal(s): Safe transition to appropriate next level of care at discharge, Engage patient in therapeutic group addressing interpersonal concerns.  Short Term Goals: Engage patient in aftercare planning with  referrals and resources, Increase social support, Increase ability to appropriately verbalize feelings, Increase emotional regulation, Facilitate acceptance of mental health diagnosis and concerns, Facilitate patient progression through stages of change regarding substance use diagnoses and concerns, Identify triggers associated with mental health/substance abuse issues, and Increase skills for wellness and recovery  Therapeutic Interventions: Assess for all discharge needs, 1 to 1 time with Social worker, Explore available resources and support systems, Assess for adequacy in community support network, Educate family and significant other(s) on suicide prevention, Complete Psychosocial Assessment, Interpersonal group therapy.  Evaluation of Outcomes: Not Met   Progress in Treatment: Attending groups: No. Participating in groups: No. Taking medication as prescribed: Yes. Toleration medication: Yes. Family/Significant other contact made: Yes, individual(s) contacted:  [name] legal guardian Patient understands diagnosis: Yes. Discussing patient identified problems/goals with staff: Yes. Medical problems stabilized or resolved: Yes. Denies suicidal/homicidal ideation: Yes. Issues/concerns per patient self-inventory: No. Other: none  New problem(s) identified: No, Describe:  none identified  New Short Term/Long Term Goal(s): elimination of symptoms of psychosis, medication management for mood stabilization; elimination of SI thoughts; development of comprehensive mental wellness plan.    Patient Goals:  Pt declined to participate in treatment team meeting despite personal invitation by staff.   Discharge Plan or Barriers: CSW will assist pt with development of an appropriate aftercare/discharge plan.     Reason for Continuation of Hospitalization: Hallucinations Medication stabilization Suicidal ideation  Estimated Length of Stay:1-7 days  Last 3 [place] Suicide Severity Risk  Score: Flowsheet Row Admission (Current) from 05/21/2024 in [facility] ED from 05/20/2024 in [facility] Emergency Department at [facility] ED from 05/19/2024 in [facility] Emergency Department at [facility]  C-SSRS RISK CATEGORY High Risk No Risk High Risk    Last PHQ 2/9 Scores:    03/20/2021   11:14 AM 03/20/2021   11:04 AM  Depression screen PHQ 2/9  Decreased Interest 0 0  Down, Depressed, Hopeless 0 0  PHQ - 2 Score 0 0    Scribe for Treatment Team: Pamila Katrina HUGHS 05/27/2024 1:56 PM

## 2024-05-28 DIAGNOSIS — F200 Paranoid schizophrenia: Secondary | ICD-10-CM | POA: Diagnosis not present

## 2024-05-28 MED ORDER — BENZTROPINE MESYLATE 1 MG PO TABS
0.5000 mg | ORAL_TABLET | Freq: Every day | ORAL | Status: DC
  Administered 2024-05-28 – 2024-06-06 (×10): 0.5 mg via ORAL
  Filled 2024-05-28 (×10): qty 1

## 2024-05-28 NOTE — Progress Notes (Signed)
°   05/28/24 0907  Psych Admission Type (Psych Patients Only)  Admission Status Involuntary  Psychosocial Assessment  Patient Complaints Anxiety  Eye Contact Brief  Facial Expression Anxious  Affect Anxious  Speech Soft  Interaction Minimal  Motor Activity Slow  Appearance/Hygiene Improved  Behavior Characteristics Anxious;Cooperative  Mood Anxious  Aggressive Behavior  Effect No apparent injury  Thought Process  Coherency WDL  Content WDL  Delusions None reported or observed  Perception WDL  Hallucination None reported or observed  Judgment Impaired  Confusion WDL  Danger to Self  Current suicidal ideation? Denies  Self-Injurious Behavior No self-injurious ideation or behavior indicators observed or expressed   Agreement Not to Harm Self Yes  Description of Agreement verbal  Danger to Others  Danger to Others None reported or observed

## 2024-05-28 NOTE — Progress Notes (Signed)
°   05/28/24 0553  Psychosocial Assessment  Patient Complaints Anxiety  Eye Contact Brief  Facial Expression Flat;Anxious  Affect Depressed;Apprehensive  Speech Soft  Interaction Minimal;Assertive  Motor Activity Slow  Appearance/Hygiene Improved  Behavior Characteristics Appropriate to situation;Cooperative  Mood Anxious  Thought Process  Coherency WDL  Content WDL  Delusions WDL  Perception WDL  Hallucination None reported or observed  Judgment WDL  Confusion WDL  Danger to Self  Current suicidal ideation? Denies  Self-Injurious Behavior No self-injurious ideation or behavior indicators observed or expressed   Agreement Not to Harm Self Yes  Description of Agreement verbal  Danger to Others  Danger to Others None reported or observed   Patient alert and oriented x 4, affect is flat but brighten upon approach, thoughts are organized and coherent no bizarre behavior noted she is interacting appropriately with peers and staff. 15 minutes safety checks maintained.

## 2024-05-28 NOTE — Group Note (Signed)
 Date:  05/28/2024 Time:  5:40 PM  Group Topic/Focus:  Overcoming Stress:   The focus of this group is to define stress and help patients assess their triggers.    Participation Level:  Did Not Attend   Lance [name] Lance [name] 05/28/2024, 5:40 PM

## 2024-05-28 NOTE — Group Note (Signed)
 Date:  05/28/2024 Time:  5:30 PM  Group Topic/Focus:  Emotional Education:   The focus of this group is to discuss what feelings/emotions are, and how they are experienced.    Participation Level:  Did Not Attend   [name] Lance [name] 05/28/2024, 5:30 PM

## 2024-05-28 NOTE — Plan of Care (Signed)
   Problem: Education: Goal: Emotional status will improve Outcome: Progressing   Problem: Education: Goal: Mental status will improve Outcome: Progressing

## 2024-05-28 NOTE — Plan of Care (Signed)
   Problem: Education: Goal: Emotional status will improve Outcome: Progressing Goal: Mental status will improve Outcome: Progressing Goal: Verbalization of understanding the information provided will improve Outcome: Progressing   Problem: Activity: Goal: Interest or engagement in activities will improve Outcome: Progressing Goal: Sleeping patterns will improve Outcome: Progressing   Problem: Coping: Goal: Ability to verbalize frustrations and anger appropriately will improve Outcome: Progressing Goal: Ability to demonstrate self-control will improve Outcome: Progressing   Problem: Health Behavior/Discharge Planning: Goal: Compliance with treatment plan for underlying cause of condition will improve Outcome: Progressing

## 2024-05-29 DIAGNOSIS — F200 Paranoid schizophrenia: Secondary | ICD-10-CM | POA: Diagnosis not present

## 2024-05-29 NOTE — Plan of Care (Signed)
  Problem: Education: Goal: Knowledge of Lance [name] General Education information/materials will improve Outcome: Progressing   Problem: Education: Goal: Emotional status will improve Outcome: Progressing   

## 2024-05-29 NOTE — Group Note (Signed)
 Date:  05/29/2024 Time:  10:24 AM  Group Topic/Focus:  Goals Group:   The focus of this group is to help patients establish daily goals to achieve during treatment and discuss how the patient can incorporate goal setting into their daily lives to aide in recovery.    Participation Level:  Did Not Attend   Lance [name] 05/29/2024, 10:24 AM

## 2024-05-29 NOTE — Progress Notes (Signed)
 [facility] MD Progress Note  05/28/2024 2:33 PM Lance [name]  MRN:  [id number]  Lance [name] is a 48 y.o. male admitted: Presented to the ED for 05/20/2024 3:15 PM for Patient arrived with IVC papers. Patient was transported to the crisis center by law enforcement after being found lying in front of tobacco store. Upon contact, he reported hearing voices. SABRA He carries the psychiatric diagnoses of Schizophrenia and has a past medical history of  Chart review significant for diabetes mellitus, hx of pulmonary embolus. Patient is admitted to adult psych unit with Q15 min safety monitoring. Multidisciplinary team approach is offered. Medication management; group/milieu therapy is offered.  Subjective:  Chart reviewed, case discussed in multidisciplinary meeting, patient seen during rounds.  December 15 patient is reporting that he is feeling better somewhat anxious no perceptual disturbances 12/14: Patient found laying in room for interview. Patient sleeping prior to interview. Upon waking, patient reports he is still having anxieties, a lot of anxieties. Discussed further and patient was able to express a feeling of having to move, shake his leg, or do something consistently (identified these as anxieties). Due to medication regimen, patient at higher risk for Akathisia - consistent movement noted when interviewing patient. Discussed Akathisia and restlessness. Discussed treatment options and patient was open to cogentin .   Denies SI, HI, and AVH. Denies other concerns/complaints.   12/13: Patient found laying in room. He reports he is having a lot of anxieties. When asked further patient is unable to explain anxiety, just reporting it happens when he wakes up and is consistent until he sleeps. He reports he tries to sleep through it.   Denies other concerns complaints. Declines changes to medication regimen at this time. Denies SI, HI, and AVH.   12/12: On interview today, patient is noted to be  more awake and alert.  He reports continued improvement in daytime tiredness.  He is noted to be more interactive on the unit.  He is tolerating medication regimen well.  He rates depression as 7 out of 10 and anxiety at 8 out of 10.  He denies SI/HI/plan.  He denies hallucinations at time of interview.  Will consider lithium  dose increase pending renal labs, ordered today.  Reports having bowel movement yesterday.  12/11: On interview today, patient is found resting in bed.  He reports daytime drowsiness is improving.  Will decrease Klonopin  to 1 mg once daily in the evening and Risperdal  1 mg at bedtime due to daytime drowsiness.  He reports good sleep and stable appetite.  He endorses depressive symptoms but denies anxiety at this time.  He denies SI/HI/plan and denies hallucinations at time of interview.   12/10: On interview today, patient is noted to be resting in bed with one-to-one sitter in the room.  Patient is noted to be drowsy, but wakes on verbal commands.  He reports drowsiness during the day.  He reports good appetite.  He denies current SI at this time.  He denies HI/plan.  He endorses occasional auditory hallucinations but denies command hallucinations.  He endorses visual hallucinations of seeing faces.  He rates anxiety as 7 out of 10 and depression as 7-8 out of 10 today.  12/9: Patient is noted to be resting in bed with one-to-one sitter in the room.  Patient is noted to be very drowsy.  He was able to wake up for the verbal commands.  He reports that he is feeling out of it.  He reports having abdominal pain  on the left side.  Being oncological patient encouraged to inform the staff if he is constipated.  Patient reports having bowel movements.  Patient denies having any urinary symptoms.  Patient vitals are stable except for low blood pressure.  Patient received increased dose of Klonopin  during this admission 2 mg twice daily.  Patient reports ongoing thoughts of suicide but denies  having any active plan.  Patient denies auditory/visual hallucinations.  Discussed the plan to reduce the dosing of Klonopin , remove melatonin and discontinue propranolol  due to low blood pressure.  Past Psychiatric History: see h&P Family History:  Family History  Problem Relation Age of Onset   CAD Mother    Rheum arthritis Mother    Healthy Father    CAD Sister    Obesity Sister    Social History:  Social History   Substance and Sexual Activity  Alcohol Use Not Currently   Comment: occassionally     Social History   Substance and Sexual Activity  Drug Use Yes   Types: Marijuana   Comment: Patient reports occasionally    Social History   Socioeconomic History   Marital status: Single    Spouse name: Not on file   Number of children: Not on file   Years of education: Not on file   Highest education level: Not on file  Occupational History   Not on file  Tobacco Use   Smoking status: Every Day    Current packs/day: 1.00    Average packs/day: 1 pack/day for 23.0 years (23.0 ttl pk-yrs)    Types: Cigarettes   Smokeless tobacco: Never   Tobacco comments:    will provide material  Vaping Use   Vaping status: Every Day  Substance and Sexual Activity   Alcohol use: Not Currently    Comment: occassionally   Drug use: Yes    Types: Marijuana    Comment: Patient reports occasionally   Sexual activity: Not Currently    Birth control/protection: None    Comment: occasional marijuana- none recently  Other Topics Concern   Not on file  Social History Narrative   From a group home in [place]   Social Drivers of Health   Tobacco Use: High Risk (05/21/2024)   Patient History    Smoking Tobacco Use: Every Day    Smokeless Tobacco Use: Never    Passive Exposure: Not on file  Financial Resource Strain: Not on file  Food Insecurity: No Food Insecurity (05/21/2024)   Epic    Worried About Programme Researcher, Broadcasting/film/video in the Last Year: Never true    Ran Out of Food in  the Last Year: Never true  Transportation Needs: No Transportation Needs (05/21/2024)   Epic    Lack of Transportation (Medical): No    Lack of Transportation (Non-Medical): No  Physical Activity: Not on file  Stress: Not on file  Social Connections: Not on file  Depression (EYV7-0): Not on file  Alcohol Screen: Low Risk (05/21/2024)   Alcohol Screen    Last Alcohol Screening Score (AUDIT): 0  Housing: Low Risk (05/21/2024)   Epic    Unable to Pay for Housing in the Last Year: No    Number of Times Moved in the Last Year: 1    Homeless in the Last Year: No  Utilities: Not At Risk (05/21/2024)   Epic    Threatened with loss of utilities: No  Health Literacy: Not on file   Past Medical History:  Past Medical History:  Diagnosis  Date   Depression    Diabetes mellitus without complication (HCC)    Hyperlipidemia    Hypertension    Lupus anticoagulant disorder 06/14/2012   as per Dr.pandit's note in dec 2013.   PE (pulmonary thromboembolism) (HCC)    Schizo affective schizophrenia (HCC)    Supratherapeutic INR 11/19/2016   History reviewed. No pertinent surgical history.  Current Medications: Current Facility-Administered Medications  Medication Dose Route Frequency Provider Last Rate Last Admin   acetaminophen  (TYLENOL ) tablet 650 mg  650 mg Oral Q6H PRN [name], [name], NP   650 mg at 05/22/24 1203   albuterol  (VENTOLIN  HFA) 108 (90 Base) MCG/ACT inhaler 2 puff  2 puff Inhalation Q6H PRN [name], [name], NP       alum & mag hydroxide-simeth (MAALOX/MYLANTA) 200-200-20 MG/5ML suspension 30 mL  30 mL Oral Q4H PRN [name], [name], NP       apixaban  (ELIQUIS ) tablet 2.5 mg  2.5 mg Oral BID [name], [name], NP   2.5 mg at 05/29/24 9178   benztropine  (COGENTIN ) tablet 0.5 mg  0.5 mg Oral Daily May, Tanya, NP   0.5 mg at 05/29/24 9178   clonazePAM  (KLONOPIN ) tablet 1 mg  1 mg Oral Daily [name], [name], PA-C   1 mg at 05/28/24 1626   cloZAPine  (CLOZARIL ) tablet 250 mg  250 mg Oral QHS  [name], [name], NP   250 mg at 05/28/24 2121   haloperidol  (HALDOL ) tablet 5 mg  5 mg Oral TID PRN [name], [name], NP   5 mg at 05/22/24 1424   And   diphenhydrAMINE  (BENADRYL ) capsule 50 mg  50 mg Oral TID PRN [name], [name], NP   50 mg at 05/22/24 1424   haloperidol  lactate (HALDOL ) injection 5 mg  5 mg Intramuscular TID PRN [name], [name], NP   5 mg at 05/21/24 1541   And   diphenhydrAMINE  (BENADRYL ) injection 50 mg  50 mg Intramuscular TID PRN [name], [name], NP       And   LORazepam  (ATIVAN ) injection 2 mg  2 mg Intramuscular TID PRN [name], [name], NP       haloperidol  lactate (HALDOL ) injection 10 mg  10 mg Intramuscular TID PRN [name], [name], NP       And   diphenhydrAMINE  (BENADRYL ) injection 50 mg  50 mg Intramuscular TID PRN [name], [name], NP   50 mg at 05/21/24 1541   And   LORazepam  (ATIVAN ) injection 2 mg  2 mg Intramuscular TID PRN [name], [name], NP   2 mg at 05/21/24 1540   feeding supplement (GLUCERNA SHAKE) (GLUCERNA SHAKE) liquid 237 mL  237 mL Oral TID BM [name], [name], MD   237 mL at 05/29/24 1344   fluticasone  furoate-vilanterol (BREO ELLIPTA ) 200-25 MCG/ACT 1 puff  1 puff Inhalation Daily [name], [name], NP   1 puff at 05/25/24 1134   hydrOXYzine  (ATARAX ) tablet 25 mg  25 mg Oral Daily PRN [name], [name], NP   25 mg at 05/29/24 9170   lactulose  (CHRONULAC ) 10 GM/15ML solution 20 g  20 g Oral BID PRN [name], [name], MD   20 g at 05/25/24 1533   lisinopril  (ZESTRIL ) tablet 2.5 mg  2.5 mg Oral Daily [name], [name], NP   2.5 mg at 05/29/24 9177   lithium  carbonate (ESKALITH ) ER tablet 450 mg  450 mg Oral QHS [name], [name], NP   450 mg at 05/28/24 2121   metFORMIN  (  GLUCOPHAGE ) tablet 1,000 mg  1,000 mg Oral BID WC [name], [name], NP   1,000 mg at 05/29/24 9178   risperiDONE  (RISPERDAL ) tablet 1 mg  1 mg Oral QHS [name], [name], PA-C   1 mg at 05/28/24 2122   simvastatin  (ZOCOR ) tablet 40 mg  40 mg Oral q1800 Mikiya Nebergall R, MD   40 mg at 05/28/24 1627   traZODone   (DESYREL ) tablet 100 mg  100 mg Oral QHS [name], [name], NP   100 mg at 05/28/24 2122    Lab Results:  No results found for this or any previous visit (from the past 48 hours).    Blood Alcohol level:  Lab Results  Component Value Date   [facility] <15 05/20/2024   ETH <15 05/19/2024    Metabolic Disorder Labs: Lab Results  Component Value Date   HGBA1C 5.7 (H) 05/22/2024   MPG 116.89 05/22/2024   MPG 93.93 09/10/2023   Lab Results  Component Value Date   PROLACTIN 2.0 (L) 08/31/2016   PROLACTIN 4.8 05/22/2016   Lab Results  Component Value Date   CHOL 160 05/22/2024   TRIG 310 (H) 05/22/2024   HDL 36 (L) 05/22/2024   CHOLHDL 4.4 05/22/2024   VLDL 62 (H) 05/22/2024   LDLCALC 62 05/22/2024   LDLCALC 68 05/02/2024    Physical Findings: AIMS:  , ,  ,  ,    CIWA:    COWS:      Psychiatric Specialty Exam:  Presentation  General Appearance:  Disheveled  Eye Contact: Fair  Speech: Clear and Coherent  Speech Volume: Normal    Mood and Affect  Mood: Dysphoric  Affect: Congruent   Thought Process  Thought Processes: Coherent  Orientation:Full (Time, Place and Person)  Thought Content:WDL  Hallucinations: Denies  Ideas of Reference:None  Suicidal Thoughts: Denies  Homicidal Thoughts: Denies   Sensorium  Memory: Immediate Fair; Recent Fair  Judgment: Fair  Insight: Lacking   Executive Functions  Concentration: Fair  Attention Span: Fair  Recall: [employer] of Knowledge: Fair  Language: Fair   Psychomotor Activity  Psychomotor Activity: Normal  Musculoskeletal: Strength & Muscle Tone: decreased Gait & Station: unsteady Assets  Assets: Manufacturing Systems Engineer; Desire for Improvement    Physical Exam: Physical Exam Vitals and nursing note reviewed.  Constitutional:      Appearance: Normal appearance.  Pulmonary:     Effort: Pulmonary effort is normal.  Neurological:     Mental Status: He is alert and  oriented to person, place, and time.  Psychiatric:        Mood and Affect: Mood normal.        Behavior: Behavior normal.    Review of Systems  Respiratory:  Negative for shortness of breath.   Cardiovascular:  Negative for chest pain.  Psychiatric/Behavioral:  Negative for depression, hallucinations and suicidal ideas. The patient is nervous/anxious.    Blood pressure 107/69, pulse 67, temperature 97.8 F (36.6 C), resp. rate 18, height 5' 10 (1.778 m), weight 86.3 kg, SpO2 100%. Body mass index is 27.3 kg/m.  Diagnosis: Principal Problem:   Schizophrenia, paranoid (HCC)   reatment Plan Summary:  Safety and Monitoring:             -- Involuntary admission to inpatient psychiatric unit for safety, stabilization and treatment             -- Daily contact with patient to assess and evaluate symptoms and progress in treatment             --  Patient's case to be discussed in multi-disciplinary team meeting             -- Observation Level: q15 minute checks             -- Vital signs:  q12 hours             -- Precautions: suicide, elopement, and assault   2. Psychiatric Diagnoses and Treatment:               Klonopin  1mg  once daily  Clozaril  250 mg daily at bedtime Lithium  ER 450 mg daily at bedtime - Renal function labs collected - BUN remains elevated at 27; GFR >60; Creatinine WNL Risperidone  to 1 mg at bedtime Trazodone  100 mg daily at bedtime Start cogentin  for potential akathisia (patient describing as anxieties with a consistent feeling/need for movement)  Will repeat labs (lithium  level, trough), CMP   -- The risks/benefits/side-effects/alternatives to this medication were discussed in detail with the patient and time was given for questions. The patient consents to medication trial.                -- Metabolic profile and EKG monitoring obtained while on an atypical antipsychotic (BMI: Lipid Panel: HbgA1c: QTc:)              -- Encouraged patient to participate in  unit milieu and in scheduled group therapies                            3. Medical Issues Being Addressed:  No acute concerns. Lactulose  for constipation.   4. Discharge Planning:   -- Social work and case management to assist with discharge planning and identification of hospital follow-up needs prior to discharge  -- Estimated LOS: 5-7 days   Millie JONELLE Manners, MD 05/28/2024, 2:33 PM

## 2024-05-29 NOTE — Group Note (Signed)
 [facility] LCSW Group Therapy Note    Group Date: 05/29/2024 Start Time: 1300 End Time: 1400  Type of Therapy and Topic:  Group Therapy:  Overcoming Obstacles  Participation Level:  [facility] PARTICIPATION LEVEL: Did Not Attend  Description of Group:   In this group patients will be encouraged to explore what they see as obstacles to their own wellness and recovery. They will be guided to discuss their thoughts, feelings, and behaviors related to these obstacles. The group will process together ways to cope with barriers, with attention given to specific choices patients can make. Each patient will be challenged to identify changes they are motivated to make in order to overcome their obstacles. This group will be process-oriented, with patients participating in exploration of their own experiences as well as giving and receiving support and challenge from other group members.  Therapeutic Goals: 1. Patient will identify personal and current obstacles as they relate to admission. 2. Patient will identify barriers that currently interfere with their wellness or overcoming obstacles.  3. Patient will identify feelings, thought process and behaviors related to these barriers. 4. Patient will identify two changes they are willing to make to overcome these obstacles:    Summary of Patient Progress Patient did not attend group.   Therapeutic Modalities:   Cognitive Behavioral Therapy Solution Focused Therapy Motivational Interviewing Relapse Prevention Therapy   Nadara JONELLE Fam, LCSW

## 2024-05-29 NOTE — Progress Notes (Signed)
°   05/29/24 0907  Psych Admission Type (Psych Patients Only)  Admission Status Involuntary  Psychosocial Assessment  Patient Complaints Anxiety  Eye Contact Brief  Facial Expression Anxious  Affect Anxious  Speech Soft  Interaction Minimal  Motor Activity Slow  Appearance/Hygiene Improved  Behavior Characteristics Cooperative  Mood Anxious  Aggressive Behavior  Effect No apparent injury  Thought Process  Coherency WDL  Content WDL  Delusions None reported or observed  Perception WDL  Hallucination None reported or observed  Judgment Poor  Confusion WDL  Danger to Self  Current suicidal ideation? Denies  Self-Injurious Behavior No self-injurious ideation or behavior indicators observed or expressed   Agreement Not to Harm Self Yes  Description of Agreement verbal  Danger to Others  Danger to Others None reported or observed

## 2024-05-29 NOTE — Group Note (Signed)
 Recreation Therapy Group Note   Group Topic:Health and Wellness  Group Date: 05/29/2024 Start Time: 1530 End Time: 1610 Facilitators: Celestia Jeoffrey BRAVO, LRT, CTRS Location: Back Dayroom  Group Description: Light Exercise & Stretching. LRT discussed the mental and physical benefits of exercise. LRT and group discussed how physical activity can be used as a coping skill. LRT lead patients in a light exercise and stretching routine, with music being played in the background. Pt's encouraged to listen to their bodies and stop at any time if they experience feelings of discomfort or pain. Pts were encouraged to drink water and stay hydrated.   Goal Area(s) Addressed:  Patient will learn benefits of physical activity. Patient will identify exercise as a coping skill.  Patient will follow multistep directions. Patient will try a new leisure interest.   Affect/Mood: N/A   Participation Level: Did not attend    Clinical Observations/Individualized Feedback: Patient did not attend.   Plan: Continue to engage patient in RT group sessions 2-3x/week.   [address] LRT, CTRS 05/29/2024 5:02 PM

## 2024-05-29 NOTE — Group Note (Signed)
 Date:  05/29/2024 Time:  9:27 PM  Group Topic/Focus:  Orientation:   The focus of this group is to educate the patient on the purpose and policies of crisis stabilization and provide a format to answer questions about their admission.  The group details unit policies and expectations of patients while admitted. Wrap-Up Group:   The focus of this group is to help patients review their daily goal of treatment and discuss progress on daily workbooks.    Participation Level:  Did Not Attend   Lance [name] 05/29/2024, 9:27 PM

## 2024-05-29 NOTE — Progress Notes (Addendum)
 [facility] MD Progress Note  05/28/2024 1:20 PM Lance [name]  MRN:  [id number]  Lance [name] is a 48 y.o. male admitted: Presented to the ED for 05/20/2024 3:15 PM for Patient arrived with IVC papers. Patient was transported to the crisis center by law enforcement after being found lying in front of tobacco store. Upon contact, he reported hearing voices. SABRA He carries the psychiatric diagnoses of Schizophrenia and has a past medical history of  Chart review significant for diabetes mellitus, hx of pulmonary embolus. Patient is admitted to adult psych unit with Q15 min safety monitoring. Multidisciplinary team approach is offered. Medication management; group/milieu therapy is offered.  Subjective:  Chart reviewed, case discussed in multidisciplinary meeting, patient seen during rounds.   12/14: Patient found laying in room for interview. Patient sleeping prior to interview. Upon waking, patient reports he is still having anxieties, a lot of anxieties. Discussed further and patient was able to express a feeling of having to move, shake his leg, or do something consistently (identified these as anxieties). Due to medication regimen, patient at higher risk for Akathisia - consistent movement noted when interviewing patient. Discussed Akathisia and restlessness. Discussed treatment options and patient was open to cogentin .   Denies SI, HI, and AVH. Denies other concerns/complaints.   12/13: Patient found laying in room. He reports he is having a lot of anxieties. When asked further patient is unable to explain anxiety, just reporting it happens when he wakes up and is consistent until he sleeps. He reports he tries to sleep through it.   Denies other concerns complaints. Declines changes to medication regimen at this time. Denies SI, HI, and AVH.   12/12: On interview today, patient is noted to be more awake and alert.  He reports continued improvement in daytime tiredness.  He is noted to be more  interactive on the unit.  He is tolerating medication regimen well.  He rates depression as 7 out of 10 and anxiety at 8 out of 10.  He denies SI/HI/plan.  He denies hallucinations at time of interview.  Will consider lithium  dose increase pending renal labs, ordered today.  Reports having bowel movement yesterday.  12/11: On interview today, patient is found resting in bed.  He reports daytime drowsiness is improving.  Will decrease Klonopin  to 1 mg once daily in the evening and Risperdal  1 mg at bedtime due to daytime drowsiness.  He reports good sleep and stable appetite.  He endorses depressive symptoms but denies anxiety at this time.  He denies SI/HI/plan and denies hallucinations at time of interview.   12/10: On interview today, patient is noted to be resting in bed with one-to-one sitter in the room.  Patient is noted to be drowsy, but wakes on verbal commands.  He reports drowsiness during the day.  He reports good appetite.  He denies current SI at this time.  He denies HI/plan.  He endorses occasional auditory hallucinations but denies command hallucinations.  He endorses visual hallucinations of seeing faces.  He rates anxiety as 7 out of 10 and depression as 7-8 out of 10 today.  12/9: Patient is noted to be resting in bed with one-to-one sitter in the room.  Patient is noted to be very drowsy.  He was able to wake up for the verbal commands.  He reports that he is feeling out of it.  He reports having abdominal pain on the left side.  Being oncological patient encouraged to inform the staff if  he is constipated.  Patient reports having bowel movements.  Patient denies having any urinary symptoms.  Patient vitals are stable except for low blood pressure.  Patient received increased dose of Klonopin  during this admission 2 mg twice daily.  Patient reports ongoing thoughts of suicide but denies having any active plan.  Patient denies auditory/visual hallucinations.  Discussed the plan to reduce  the dosing of Klonopin , remove melatonin and discontinue propranolol  due to low blood pressure.  Past Psychiatric History: see h&P Family History:  Family History  Problem Relation Age of Onset   CAD Mother    Rheum arthritis Mother    Healthy Father    CAD Sister    Obesity Sister    Social History:  Social History   Substance and Sexual Activity  Alcohol Use Not Currently   Comment: occassionally     Social History   Substance and Sexual Activity  Drug Use Yes   Types: Marijuana   Comment: Patient reports occasionally    Social History   Socioeconomic History   Marital status: Single    Spouse name: Not on file   Number of children: Not on file   Years of education: Not on file   Highest education level: Not on file  Occupational History   Not on file  Tobacco Use   Smoking status: Every Day    Current packs/day: 1.00    Average packs/day: 1 pack/day for 23.0 years (23.0 ttl pk-yrs)    Types: Cigarettes   Smokeless tobacco: Never   Tobacco comments:    will provide material  Vaping Use   Vaping status: Every Day  Substance and Sexual Activity   Alcohol use: Not Currently    Comment: occassionally   Drug use: Yes    Types: Marijuana    Comment: Patient reports occasionally   Sexual activity: Not Currently    Birth control/protection: None    Comment: occasional marijuana- none recently  Other Topics Concern   Not on file  Social History Narrative   From a group home in [place]   Social Drivers of Health   Tobacco Use: High Risk (05/21/2024)   Patient History    Smoking Tobacco Use: Every Day    Smokeless Tobacco Use: Never    Passive Exposure: Not on file  Financial Resource Strain: Not on file  Food Insecurity: No Food Insecurity (05/21/2024)   Epic    Worried About Programme Researcher, Broadcasting/film/video in the Last Year: Never true    Ran Out of Food in the Last Year: Never true  Transportation Needs: No Transportation Needs (05/21/2024)   Epic    Lack  of Transportation (Medical): No    Lack of Transportation (Non-Medical): No  Physical Activity: Not on file  Stress: Not on file  Social Connections: Not on file  Depression (EYV7-0): Not on file  Alcohol Screen: Low Risk (05/21/2024)   Alcohol Screen    Last Alcohol Screening Score (AUDIT): 0  Housing: Low Risk (05/21/2024)   Epic    Unable to Pay for Housing in the Last Year: No    Number of Times Moved in the Last Year: 1    Homeless in the Last Year: No  Utilities: Not At Risk (05/21/2024)   Epic    Threatened with loss of utilities: No  Health Literacy: Not on file   Past Medical History:  Past Medical History:  Diagnosis Date   Depression    Diabetes mellitus without complication (HCC)  Hyperlipidemia    Hypertension    Lupus anticoagulant disorder 06/14/2012   as per Dr.pandit's note in dec 2013.   PE (pulmonary thromboembolism) (HCC)    Schizo affective schizophrenia (HCC)    Supratherapeutic INR 11/19/2016   History reviewed. No pertinent surgical history.  Current Medications: Current Facility-Administered Medications  Medication Dose Route Frequency Provider Last Rate Last Admin   acetaminophen  (TYLENOL ) tablet 650 mg  650 mg Oral Q6H PRN [name], [name], NP   650 mg at 05/22/24 1203   albuterol  (VENTOLIN  HFA) 108 (90 Base) MCG/ACT inhaler 2 puff  2 puff Inhalation Q6H PRN [name], [name], NP       alum & mag hydroxide-simeth (MAALOX/MYLANTA) 200-200-20 MG/5ML suspension 30 mL  30 mL Oral Q4H PRN [name], [name], NP       apixaban  (ELIQUIS ) tablet 2.5 mg  2.5 mg Oral BID [name], [name], NP   2.5 mg at 05/29/24 0821   benztropine  (COGENTIN ) tablet 0.5 mg  0.5 mg Oral Daily Shamon Cothran, NP   0.5 mg at 05/29/24 9178   clonazePAM  (KLONOPIN ) tablet 1 mg  1 mg Oral Daily [name], [name], PA-C   1 mg at 05/28/24 1626   cloZAPine  (CLOZARIL ) tablet 250 mg  250 mg Oral QHS [name], [name], NP   250 mg at 05/28/24 2121   haloperidol  (HALDOL ) tablet 5 mg  5 mg Oral TID PRN [name],  [name], NP   5 mg at 05/22/24 1424   And   diphenhydrAMINE  (BENADRYL ) capsule 50 mg  50 mg Oral TID PRN [name], [name], NP   50 mg at 05/22/24 1424   haloperidol  lactate (HALDOL ) injection 5 mg  5 mg Intramuscular TID PRN [name], [name], NP   5 mg at 05/21/24 1541   And   diphenhydrAMINE  (BENADRYL ) injection 50 mg  50 mg Intramuscular TID PRN [name], [name], NP       And   LORazepam  (ATIVAN ) injection 2 mg  2 mg Intramuscular TID PRN [name], [name], NP       haloperidol  lactate (HALDOL ) injection 10 mg  10 mg Intramuscular TID PRN [name], [name], NP       And   diphenhydrAMINE  (BENADRYL ) injection 50 mg  50 mg Intramuscular TID PRN [name], [name], NP   50 mg at 05/21/24 1541   And   LORazepam  (ATIVAN ) injection 2 mg  2 mg Intramuscular TID PRN [name], [name], NP   2 mg at 05/21/24 1540   feeding supplement (GLUCERNA SHAKE) (GLUCERNA SHAKE) liquid 237 mL  237 mL Oral TID BM [name], [name], MD   237 mL at 05/28/24 2100   fluticasone  furoate-vilanterol (BREO ELLIPTA ) 200-25 MCG/ACT 1 puff  1 puff Inhalation Daily [name], [name], NP   1 puff at 05/25/24 1134   hydrOXYzine  (ATARAX ) tablet 25 mg  25 mg Oral Daily PRN [name], [name], NP   25 mg at 05/29/24 9170   lactulose  (CHRONULAC ) 10 GM/15ML solution 20 g  20 g Oral BID PRN [name], [name], MD   20 g at 05/25/24 1533   lisinopril  (ZESTRIL ) tablet 2.5 mg  2.5 mg Oral Daily [name], [name], NP   2.5 mg at 05/29/24 9177   lithium  carbonate (ESKALITH ) ER tablet 450 mg  450 mg Oral QHS [name], [name], NP   450 mg at 05/28/24 2121   metFORMIN  (GLUCOPHAGE ) tablet 1,000 mg  1,000 mg Oral BID WC [name], [name], NP  1,000 mg at 05/29/24 9178   risperiDONE  (RISPERDAL ) tablet 1 mg  1 mg Oral QHS [name], [name], PA-C   1 mg at 05/28/24 2122   simvastatin  (ZOCOR ) tablet 40 mg  40 mg Oral q1800 [name], [name], MD   40 mg at 05/28/24 1627   traZODone  (DESYREL ) tablet 100 mg  100 mg Oral QHS [name], [name], NP   100 mg at 05/28/24 2122    Lab Results:   No results found for this or any previous visit (from the past 48 hours).    Blood Alcohol level:  Lab Results  Component Value Date   [facility] <15 05/20/2024   ETH <15 05/19/2024    Metabolic Disorder Labs: Lab Results  Component Value Date   HGBA1C 5.7 (H) 05/22/2024   MPG 116.89 05/22/2024   MPG 93.93 09/10/2023   Lab Results  Component Value Date   PROLACTIN 2.0 (L) 08/31/2016   PROLACTIN 4.8 05/22/2016   Lab Results  Component Value Date   CHOL 160 05/22/2024   TRIG 310 (H) 05/22/2024   HDL 36 (L) 05/22/2024   CHOLHDL 4.4 05/22/2024   VLDL 62 (H) 05/22/2024   LDLCALC 62 05/22/2024   LDLCALC 68 05/02/2024    Physical Findings: AIMS:  , ,  ,  ,    CIWA:    COWS:      Psychiatric Specialty Exam:  Presentation  General Appearance:  Disheveled  Eye Contact: Fair  Speech: Clear and Coherent  Speech Volume: Normal    Mood and Affect  Mood: Dysphoric  Affect: Congruent   Thought Process  Thought Processes: Coherent  Orientation:Full (Time, Place and Person)  Thought Content:WDL  Hallucinations: Denies  Ideas of Reference:None  Suicidal Thoughts: Denies  Homicidal Thoughts: Denies   Sensorium  Memory: Immediate Fair; Recent Fair  Judgment: Fair  Insight: Lacking   Executive Functions  Concentration: Fair  Attention Span: Fair  Recall: [employer] of Knowledge: Fair  Language: Fair   Psychomotor Activity  Psychomotor Activity: Normal  Musculoskeletal: Strength & Muscle Tone: decreased Gait & Station: unsteady Assets  Assets: Manufacturing Systems Engineer; Desire for Improvement    Physical Exam: Physical Exam Vitals and nursing note reviewed.  Constitutional:      Appearance: Normal appearance.  Pulmonary:     Effort: Pulmonary effort is normal.  Neurological:     Mental Status: He is alert and oriented to person, place, and time.  Psychiatric:        Mood and Affect: Mood normal.        Behavior:  Behavior normal.    Review of Systems  Respiratory:  Negative for shortness of breath.   Cardiovascular:  Negative for chest pain.  Psychiatric/Behavioral:  Negative for depression, hallucinations and suicidal ideas. The patient is nervous/anxious.    Blood pressure 107/69, pulse 67, temperature 97.8 F (36.6 C), resp. rate 18, height 5' 10 (1.778 m), weight 86.3 kg, SpO2 100%. Body mass index is 27.3 kg/m.  Diagnosis: Principal Problem:   Schizophrenia, paranoid (HCC)   reatment Plan Summary:  Safety and Monitoring:             -- Involuntary admission to inpatient psychiatric unit for safety, stabilization and treatment             -- Daily contact with patient to assess and evaluate symptoms and progress in treatment             -- Patient's case to be discussed in multi-disciplinary  team meeting             -- Observation Level: q15 minute checks             -- Vital signs:  q12 hours             -- Precautions: suicide, elopement, and assault   2. Psychiatric Diagnoses and Treatment:               Klonopin  1mg  once daily  Clozaril  250 mg daily at bedtime Lithium  ER 450 mg daily at bedtime - Renal function labs collected - BUN remains elevated at 27; GFR >60; Creatinine WNL Risperidone  to 1 mg at bedtime Trazodone  100 mg daily at bedtime Start cogentin  for potential akathisia (patient describing as anxieties with a consistent feeling/need for movement)  Will repeat labs (lithium  level, trough), CMP   -- The risks/benefits/side-effects/alternatives to this medication were discussed in detail with the patient and time was given for questions. The patient consents to medication trial.                -- Metabolic profile and EKG monitoring obtained while on an atypical antipsychotic (BMI: Lipid Panel: HbgA1c: QTc:)              -- Encouraged patient to participate in unit milieu and in scheduled group therapies                            3. Medical Issues Being  Addressed:  No acute concerns. Lactulose  for constipation.   4. Discharge Planning:   -- Social work and case management to assist with discharge planning and identification of hospital follow-up needs prior to discharge  -- Estimated LOS: 5-7 days   Spruha Weight, NP 05/28/2024, 1:20 PM

## 2024-05-29 NOTE — Group Note (Signed)
 Recreation Therapy Group Note   Group Topic:Coping Skills  Group Date: 05/29/2024 Start Time: 1035 End Time: 1125 Facilitators: Celestia Jeoffrey BRAVO, LRT, CTRS Location: Craft Room  Group Description: Mind Map.  Patient was provided a blank template of a diagram with 32 blank boxes in a tiered system, branching from the center (similar to a bubble chart). LRT directed patients to label the middle of the diagram Coping Skills. LRT and patients then came up with 8 different coping skills as examples. Pt were directed to record their coping skills in the 2nd tier boxes closest to the center.  Patients would then share their coping skills with the group as LRT wrote them out. LRT gave a handout of 99 different coping skills at the end of group.   Goal Area(s) Addressed: Patients will be able to define coping skills. Patient will identify new coping skills.  Patient will increase communication.   Affect/Mood: N/A   Participation Level: Did not attend    Clinical Observations/Individualized Feedback: Patient did not attend group.   Plan: Continue to engage patient in RT group sessions 2-3x/week.   Jeoffrey BRAVO Celestia, LRT, CTRS 05/29/2024 11:40 AM

## 2024-05-29 NOTE — Plan of Care (Signed)
°  Problem: Education: Goal: Mental status will improve Outcome: Progressing Goal: Verbalization of understanding the information provided will improve Outcome: Progressing   Problem: Activity: Goal: Interest or engagement in activities will improve Outcome: Progressing   Problem: Coping: Goal: Ability to verbalize frustrations and anger appropriately will improve Outcome: Progressing Goal: Ability to demonstrate self-control will improve Outcome: Progressing   Problem: Health Behavior/Discharge Planning: Goal: Compliance with treatment plan for underlying cause of condition will improve Outcome: Progressing   Problem: Education: Goal: Emotional status will improve Outcome: Not Progressing   Problem: Activity: Goal: Sleeping patterns will improve Outcome: Not Progressing

## 2024-05-30 DIAGNOSIS — F200 Paranoid schizophrenia: Secondary | ICD-10-CM | POA: Diagnosis not present

## 2024-05-30 LAB — COMPREHENSIVE METABOLIC PANEL WITH GFR
ALT: 23 U/L (ref 0–44)
AST: 18 U/L (ref 15–41)
Albumin: 4.3 g/dL (ref 3.5–5.0)
Alkaline Phosphatase: 65 U/L (ref 38–126)
Anion gap: 10 (ref 5–15)
BUN: 29 mg/dL — ABNORMAL HIGH (ref 6–20)
CO2: 25 mmol/L (ref 22–32)
Calcium: 10.2 mg/dL (ref 8.9–10.3)
Chloride: 103 mmol/L (ref 98–111)
Creatinine, Ser: 1.19 mg/dL (ref 0.61–1.24)
GFR, Estimated: >60 mL/min (ref >60)
Glucose, Bld: 289 mg/dL — ABNORMAL HIGH (ref 70–99)
Potassium: 5 mmol/L (ref 3.5–5.1)
Sodium: 138 mmol/L (ref 135–145)
Total Bilirubin: 0.2 mg/dL (ref 0.0–1.2)
Total Protein: 6.4 g/dL — ABNORMAL LOW (ref 6.5–8.1)

## 2024-05-30 LAB — LITHIUM LEVEL: Lithium Lvl: 0.67 mmol/L (ref 0.60–1.20)

## 2024-05-30 MED ORDER — CLONAZEPAM 0.5 MG PO TABS
0.5000 mg | ORAL_TABLET | Freq: Every day | ORAL | Status: DC
  Administered 2024-05-31 – 2024-06-06 (×7): 0.5 mg via ORAL
  Filled 2024-05-30 (×7): qty 1

## 2024-05-30 NOTE — [facility] Counselor (Signed)
°  CSW spoke with the patient's guardian pertaining to CSW Michaela's initial report of a 30 day notice for patient to vacate his group home.    Guardian confirmed this and reported that the letter is dated for 05/19/24 and has the official eviction date of January 5th, 2026.   Legal guardian requested to speak to patient to make him aware of this and prepare him for safe discharge. Legal guardian was connected to patient to have this conversation.    This has been communicated to team.   CSW to continue to assess.   Lance [name], MSW, LCSWA 05/30/2024 3:17 PM

## 2024-05-30 NOTE — Group Note (Signed)
 Recreation Therapy Group Note   Group Topic:Health and Wellness  Group Date: 05/30/2024 Start Time: 1515 End Time: 1600 Facilitators: Celestia Jeoffrey BRAVO, LRT, CTRS Location: Courtyard  Group Description: [employer]. LRT and patients played games of basketball, drew with chalk, and played corn hole while outside in the courtyard while getting fresh air and sunlight. Music was being played in the background. LRT and peers conversed about different games they have played before, what they do in their free time and anything else that is on their minds. LRT encouraged pts to drink water after being outside, sweating and getting their heart rate up.  Goal Area(s) Addressed: Patient will build on frustration tolerance skills. Patients will partake in a competitive play game with peers. Patients will gain knowledge of new leisure interest/hobby.    Affect/Mood: Flat   Participation Level: Non-verbal    Clinical Observations/Individualized Feedback: Lance [name] was in and out of group. Pt did not interact.   Plan: Continue to engage patient in RT group sessions 2-3x/week.   Jeoffrey BRAVO Celestia, LRT, CTRS 05/30/2024 4:48 PM

## 2024-05-30 NOTE — Group Note (Signed)
 Recreation Therapy Group Note   Group Topic:Healthy Support Systems  Group Date: 05/30/2024 Start Time: 1010 End Time: 1055 Facilitators: Celestia Jeoffrey BRAVO, LRT, CTRS Location: Craft Room  Group Description: Straw Bridge. In groups or individually, patients were given 10 plastic drinking straws and an equal length of masking tape. Using the materials provided, patients were instructed to build a free-standing bridge-like structure to suspend an everyday item (ex: deck of cards) off the floor or table surface. All materials were required to be used in secondary school teacher. LRT facilitated post-activity discussion reviewing the importance of having strong and healthy support systems in our lives. LRT discussed how the people in our lives serve as the tape and the deck of cards we placed on top of our straw structure are the stressors we face in daily life. LRT and pts discussed what happens in our life when things get too heavy for us , and we don't have strong supports outside of the hospital. Pt shared 2 of their healthy supports in their life aloud in the group.   Goal Area(s) Addressed:  Patient will identify 2 healthy supports in their life. Patient will identify skills to successfully complete activity. Patient will identify correlation of this activity to life post-discharge.  Patient will build on frustration tolerance skills. Patient will increase team building and communication skills.    Affect/Mood: N/A   Participation Level: Did not attend    Clinical Observations/Individualized Feedback: Patient did not attend group.   Plan: Continue to engage patient in RT group sessions 2-3x/week.   Jeoffrey BRAVO Celestia, LRT, CTRS 05/30/2024 11:30 AM

## 2024-05-30 NOTE — Group Note (Signed)
 LCSW Group Therapy Note   Group Date: 05/30/2024 Start Time: 1300 End Time: 1400   Type of Therapy and Topic:  Group Therapy: Challenging Core Beliefs  Participation Level:  Did Not Attend  Description of Group:  Patients were educated about core beliefs and asked to identify one harmful core belief that they have. Patients were asked to explore from where those beliefs originate. Patients were asked to discuss how those beliefs make them feel and the resulting behaviors of those beliefs. They were then be asked if those beliefs are true and, if so, what evidence they have to support them. Lastly, group members were challenged to replace those negative core beliefs with helpful beliefs.   Therapeutic Goals:   1. Patient will identify harmful core beliefs and explore the origins of such beliefs. 2. Patient will identify feelings and behaviors that result from those core beliefs. 3. Patient will discuss whether such beliefs are true. 4.  Patient will replace harmful core beliefs with helpful ones.  Summary of Patient Progress:  Patient did not attend.   Therapeutic Modalities: Cognitive Behavioral Therapy; Solution-Focused Therapy   Lance [name] M Amylah Will, ISRAEL 05/30/2024  1:59 PM

## 2024-05-30 NOTE — Progress Notes (Signed)
°   05/30/24 0354  Psychosocial Assessment  Patient Complaints Anxiety  Eye Contact Brief;Fair  Facial Expression Anxious  Affect Anxious  Speech Soft  Interaction Isolative;Minimal  Motor Activity Slow;Fidgety  Appearance/Hygiene Improved  Behavior Characteristics Cooperative  Mood Anxious  Thought Process  Coherency WDL  Content WDL  Delusions None reported or observed  Perception WDL  Hallucination None reported or observed  Judgment Poor  Confusion WDL   Patient denied SI/HI this shift and contracted for safety.

## 2024-05-30 NOTE — Plan of Care (Signed)
 Lance [name] is a 48 y.o. male patient. No diagnosis found. Past Medical History:  Diagnosis Date   Depression    Diabetes mellitus without complication (HCC)    Hyperlipidemia    Hypertension    Lupus anticoagulant disorder 06/14/2012   as per Dr.pandit's note in dec 2013.   PE (pulmonary thromboembolism) (HCC)    Schizo affective schizophrenia (HCC)    Supratherapeutic INR 11/19/2016   Current Facility-Administered Medications  Medication Dose Route Frequency Provider Last Rate Last Admin   acetaminophen  (TYLENOL ) tablet 650 mg  650 mg Oral Q6H PRN [name], [name], NP   650 mg at 05/22/24 1203   albuterol  (VENTOLIN  HFA) 108 (90 Base) MCG/ACT inhaler 2 puff  2 puff Inhalation Q6H PRN [name], [name], NP       alum & mag hydroxide-simeth (MAALOX/MYLANTA) 200-200-20 MG/5ML suspension 30 mL  30 mL Oral Q4H PRN [name], [name], NP       apixaban  (ELIQUIS ) tablet 2.5 mg  2.5 mg Oral BID [name], [name], NP   2.5 mg at 05/29/24 1621   benztropine  (COGENTIN ) tablet 0.5 mg  0.5 mg Oral Daily May, Tanya, NP   0.5 mg at 05/29/24 9178   clonazePAM  (KLONOPIN ) tablet 1 mg  1 mg Oral Daily [name], [name], PA-C   1 mg at 05/29/24 1621   cloZAPine  (CLOZARIL ) tablet 250 mg  250 mg Oral QHS [name], [name], NP   250 mg at 05/29/24 2138   haloperidol  (HALDOL ) tablet 5 mg  5 mg Oral TID PRN [name], [name], NP   5 mg at 05/22/24 1424   And   diphenhydrAMINE  (BENADRYL ) capsule 50 mg  50 mg Oral TID PRN [name], [name], NP   50 mg at 05/22/24 1424   haloperidol  lactate (HALDOL ) injection 5 mg  5 mg Intramuscular TID PRN [name], [name], NP   5 mg at 05/21/24 1541   And   diphenhydrAMINE  (BENADRYL ) injection 50 mg  50 mg Intramuscular TID PRN [name], [name], NP       And   LORazepam  (ATIVAN ) injection 2 mg  2 mg Intramuscular TID PRN [name], [name], NP       haloperidol  lactate (HALDOL ) injection 10 mg  10 mg Intramuscular TID PRN [name], [name], NP       And   diphenhydrAMINE  (BENADRYL ) injection 50 mg  50  mg Intramuscular TID PRN [name], [name], NP   50 mg at 05/21/24 1541   And   LORazepam  (ATIVAN ) injection 2 mg  2 mg Intramuscular TID PRN [name], [name], NP   2 mg at 05/21/24 1540   feeding supplement (GLUCERNA SHAKE) (GLUCERNA SHAKE) liquid 237 mL  237 mL Oral TID BM [name], [name], MD   237 mL at 05/29/24 2013   fluticasone  furoate-vilanterol (BREO ELLIPTA ) 200-25 MCG/ACT 1 puff  1 puff Inhalation Daily [name], [name], NP   1 puff at 05/25/24 1134   hydrOXYzine  (ATARAX ) tablet 25 mg  25 mg Oral Daily PRN [name], [name], NP   25 mg at 05/29/24 9170   lactulose  (CHRONULAC ) 10 GM/15ML solution 20 g  20 g Oral BID PRN [name], [name], MD   20 g at 05/25/24 1533   lisinopril  (ZESTRIL ) tablet 2.5 mg  2.5 mg Oral Daily [name], [name], NP   2.5 mg at 05/29/24 9177   lithium  carbonate (ESKALITH ) ER tablet 450 mg  450 mg Oral QHS [name], [name], NP  450 mg at 05/29/24 2140   metFORMIN  (GLUCOPHAGE ) tablet 1,000 mg  1,000 mg Oral BID WC [name], [name], NP   1,000 mg at 05/29/24 1621   risperiDONE  (RISPERDAL ) tablet 1 mg  1 mg Oral QHS [name], [name], PA-C   1 mg at 05/29/24 2140   simvastatin  (ZOCOR ) tablet 40 mg  40 mg Oral q1800 [name], [name], MD   40 mg at 05/29/24 1621   traZODone  (DESYREL ) tablet 100 mg  100 mg Oral QHS [name], [name], NP   100 mg at 05/29/24 2140   Allergies[1] Principal Problem:   Schizophrenia, paranoid (HCC)  Blood pressure 123/88, pulse 99, temperature 97.8 F (36.6 C), resp. rate 18, height 5' 10 (1.778 m), weight 86.3 kg, SpO2 97%.    Dezman Granda B Kamonte Mcmichen 05/30/2024      [1]  Allergies Allergen Reactions   Penicillins Other (See Comments)    Reaction: lockjaw as teen Tolerated Augmentin /Unasyn  06/09/22

## 2024-05-30 NOTE — Plan of Care (Signed)
   Problem: Education: Goal: Emotional status will improve Outcome: Progressing Goal: Mental status will improve Outcome: Progressing Goal: Verbalization of understanding the information provided will improve Outcome: Progressing

## 2024-05-30 NOTE — Group Note (Signed)
 Date:  05/30/2024 Time:  8:41 PM  Group Topic/Focus:  Orientation:   The focus of this group is to educate the patient on the purpose and policies of crisis stabilization and provide a format to answer questions about their admission.  The group details unit policies and expectations of patients while admitted. Wrap-Up Group:   The focus of this group is to help patients review their daily goal of treatment and discuss progress on daily workbooks.    Participation Level:  Did Not Attend   Arlester CHRISTELLA Servant 05/30/2024, 8:41 PM

## 2024-05-30 NOTE — Progress Notes (Incomplete)
 [facility] MD Progress Note  05/30/2024 6:01 PM Lance [name]  MRN:  [id number]  Lance [name] is a 48 y.o. male admitted: Presented to the ED for 05/20/2024 3:15 PM for Patient arrived with IVC papers. Patient was transported to the crisis center by law enforcement after being found lying in front of tobacco store. Upon contact, he reported hearing voices. Lance [name] He carries the psychiatric diagnoses of Schizophrenia and has a past medical history of  Chart review significant for diabetes mellitus, hx of pulmonary embolus. Patient is admitted to adult psych unit with Q15 min safety monitoring. Multidisciplinary team approach is offered. Medication management; group/milieu therapy is offered.  Subjective:  Chart reviewed, case discussed in multidisciplinary meeting, patient seen during rounds.   12/16: On interview today, patient is noted to be resting in bed.  He reports passive SI without intent or plan.  He is able to contract for safety in the current environment.  He denies HI/plan.  He denies current auditory hallucinations, states last episode of this was a couple days ago where he was hearing hollering, screaming.  He denies command hallucinations.  He denies current visual hallucinations.  He rates depression as 6-7 out of 10 and anxiety at 6 out of 10 today.  He reports fair sleep and good appetite.  He states anxiety fluctuates throughout the day.  He is tolerating current medication regimen well without adverse effects.  He is agreeable to adding Klonopin  dose in the morning.  December 15 patient is reporting that he is feeling better somewhat anxious no perceptual disturbances 12/14: Patient found laying in room for interview. Patient sleeping prior to interview. Upon waking, patient reports he is still having anxieties, a lot of anxieties. Discussed further and patient was able to express a feeling of having to move, shake his leg, or do something consistently (identified these as anxieties).  Due to medication regimen, patient at higher risk for Akathisia - consistent movement noted when interviewing patient. Discussed Akathisia and restlessness. Discussed treatment options and patient was open to cogentin .   Denies SI, HI, and AVH. Denies other concerns/complaints.   12/13: Patient found laying in room. He reports he is having a lot of anxieties. When asked further patient is unable to explain anxiety, just reporting it happens when he wakes up and is consistent until he sleeps. He reports he tries to sleep through it.   Denies other concerns complaints. Declines changes to medication regimen at this time. Denies SI, HI, and AVH.   12/12: On interview today, patient is noted to be more awake and alert.  He reports continued improvement in daytime tiredness.  He is noted to be more interactive on the unit.  He is tolerating medication regimen well.  He rates depression as 7 out of 10 and anxiety at 8 out of 10.  He denies SI/HI/plan.  He denies hallucinations at time of interview.  Will consider lithium  dose increase pending renal labs, ordered today.  Reports having bowel movement yesterday.  12/11: On interview today, patient is found resting in bed.  He reports daytime drowsiness is improving.  Will decrease Klonopin  to 1 mg once daily in the evening and Risperdal  1 mg at bedtime due to daytime drowsiness.  He reports good sleep and stable appetite.  He endorses depressive symptoms but denies anxiety at this time.  He denies SI/HI/plan and denies hallucinations at time of interview.   12/10: On interview today, patient is noted to be resting in bed with  one-to-one sitter in the room.  Patient is noted to be drowsy, but wakes on verbal commands.  He reports drowsiness during the day.  He reports good appetite.  He denies current SI at this time.  He denies HI/plan.  He endorses occasional auditory hallucinations but denies command hallucinations.  He endorses visual hallucinations of  seeing faces.  He rates anxiety as 7 out of 10 and depression as 7-8 out of 10 today.  12/9: Patient is noted to be resting in bed with one-to-one sitter in the room.  Patient is noted to be very drowsy.  He was able to wake up for the verbal commands.  He reports that he is feeling out of it.  He reports having abdominal pain on the left side.  Being oncological patient encouraged to inform the staff if he is constipated.  Patient reports having bowel movements.  Patient denies having any urinary symptoms.  Patient vitals are stable except for low blood pressure.  Patient received increased dose of Klonopin  during this admission 2 mg twice daily.  Patient reports ongoing thoughts of suicide but denies having any active plan.  Patient denies auditory/visual hallucinations.  Discussed the plan to reduce the dosing of Klonopin , remove melatonin and discontinue propranolol  due to low blood pressure.  Past Psychiatric History: see h&P Family History:  Family History  Problem Relation Age of Onset   CAD Mother    Rheum arthritis Mother    Healthy Father    CAD Sister    Obesity Sister    Social History:  Social History   Substance and Sexual Activity  Alcohol Use Not Currently   Comment: occassionally     Social History   Substance and Sexual Activity  Drug Use Yes   Types: Marijuana   Comment: Patient reports occasionally    Social History   Socioeconomic History   Marital status: Single    Spouse name: Not on file   Number of children: Not on file   Years of education: Not on file   Highest education level: Not on file  Occupational History   Not on file  Tobacco Use   Smoking status: Every Day    Current packs/day: 1.00    Average packs/day: 1 pack/day for 23.0 years (23.0 ttl pk-yrs)    Types: Cigarettes   Smokeless tobacco: Never   Tobacco comments:    will provide material  Vaping Use   Vaping status: Every Day  Substance and Sexual Activity   Alcohol use: Not  Currently    Comment: occassionally   Drug use: Yes    Types: Marijuana    Comment: Patient reports occasionally   Sexual activity: Not Currently    Birth control/protection: None    Comment: occasional marijuana- none recently  Other Topics Concern   Not on file  Social History Narrative   From a group home in [place]   Social Drivers of Health   Tobacco Use: High Risk (05/21/2024)   Patient History    Smoking Tobacco Use: Every Day    Smokeless Tobacco Use: Never    Passive Exposure: Not on file  Financial Resource Strain: Not on file  Food Insecurity: No Food Insecurity (05/21/2024)   Epic    Worried About Programme Researcher, Broadcasting/film/video in the Last Year: Never true    Ran Out of Food in the Last Year: Never true  Transportation Needs: No Transportation Needs (05/21/2024)   Epic    Lack of Transportation (Medical): No  Lack of Transportation (Non-Medical): No  Physical Activity: Not on file  Stress: Not on file  Social Connections: Not on file  Depression (EYV7-0): Not on file  Alcohol Screen: Low Risk (05/21/2024)   Alcohol Screen    Last Alcohol Screening Score (AUDIT): 0  Housing: Low Risk (05/21/2024)   Epic    Unable to Pay for Housing in the Last Year: No    Number of Times Moved in the Last Year: 1    Homeless in the Last Year: No  Utilities: Not At Risk (05/21/2024)   Epic    Threatened with loss of utilities: No  Health Literacy: Not on file   Past Medical History:  Past Medical History:  Diagnosis Date   Depression    Diabetes mellitus without complication (HCC)    Hyperlipidemia    Hypertension    Lupus anticoagulant disorder 06/14/2012   as per Dr.pandit's note in dec 2013.   PE (pulmonary thromboembolism) (HCC)    Schizo affective schizophrenia (HCC)    Supratherapeutic INR 11/19/2016   History reviewed. No pertinent surgical history.  Current Medications: Current Facility-Administered Medications  Medication Dose Route Frequency Provider Last Rate  Last Admin   acetaminophen  (TYLENOL ) tablet 650 mg  650 mg Oral Q6H PRN [name], [name], NP   650 mg at 05/22/24 1203   albuterol  (VENTOLIN  HFA) 108 (90 Base) MCG/ACT inhaler 2 puff  2 puff Inhalation Q6H PRN [name], [name], NP       alum & mag hydroxide-simeth (MAALOX/MYLANTA) 200-200-20 MG/5ML suspension 30 mL  30 mL Oral Q4H PRN [name], [name], NP       apixaban  (ELIQUIS ) tablet 2.5 mg  2.5 mg Oral BID [name], [name], NP   2.5 mg at 05/30/24 1708   benztropine  (COGENTIN ) tablet 0.5 mg  0.5 mg Oral Daily May, Tanya, NP   0.5 mg at 05/30/24 0827   [START ON 05/31/2024] clonazePAM  (KLONOPIN ) tablet 0.5 mg  0.5 mg Oral Daily Georgetta Crafton L, PA-C       clonazePAM  (KLONOPIN ) tablet 1 mg  1 mg Oral Daily Wendell Fiebig L, PA-C   1 mg at 05/30/24 1707   cloZAPine  (CLOZARIL ) tablet 250 mg  250 mg Oral QHS [name], [name], NP   250 mg at 05/29/24 2138   haloperidol  (HALDOL ) tablet 5 mg  5 mg Oral TID PRN [name], [name], NP   5 mg at 05/22/24 1424   And   diphenhydrAMINE  (BENADRYL ) capsule 50 mg  50 mg Oral TID PRN [name], [name], NP   50 mg at 05/22/24 1424   haloperidol  lactate (HALDOL ) injection 5 mg  5 mg Intramuscular TID PRN [name], [name], NP   5 mg at 05/21/24 1541   And   diphenhydrAMINE  (BENADRYL ) injection 50 mg  50 mg Intramuscular TID PRN [name], [name], NP       And   LORazepam  (ATIVAN ) injection 2 mg  2 mg Intramuscular TID PRN [name], [name], NP       haloperidol  lactate (HALDOL ) injection 10 mg  10 mg Intramuscular TID PRN [name], [name], NP       And   diphenhydrAMINE  (BENADRYL ) injection 50 mg  50 mg Intramuscular TID PRN [name], [name], NP   50 mg at 05/21/24 1541   And   LORazepam  (ATIVAN ) injection 2 mg  2 mg Intramuscular TID PRN [name], [name], NP   2 mg at 05/21/24 1540   feeding supplement (GLUCERNA SHAKE) (  GLUCERNA SHAKE) liquid 237 mL  237 mL Oral TID BM Donnelly Mellow, MD   237 mL at 05/30/24 1400   fluticasone  furoate-vilanterol (BREO ELLIPTA ) 200-25 MCG/ACT 1 puff  1  puff Inhalation Daily [name], [name], NP   1 puff at 05/30/24 9170   hydrOXYzine  (ATARAX ) tablet 25 mg  25 mg Oral Daily PRN [name], [name], NP   25 mg at 05/30/24 9173   lactulose  (CHRONULAC ) 10 GM/15ML solution 20 g  20 g Oral BID PRN [name], [name], MD   20 g at 05/25/24 1533   lisinopril  (ZESTRIL ) tablet 2.5 mg  2.5 mg Oral Daily [name], [name], NP   2.5 mg at 05/30/24 9173   lithium  carbonate (ESKALITH ) ER tablet 450 mg  450 mg Oral QHS [name], [name], NP   450 mg at 05/29/24 2140   metFORMIN  (GLUCOPHAGE ) tablet 1,000 mg  1,000 mg Oral BID WC [name], [name], NP   1,000 mg at 05/30/24 1707   risperiDONE  (RISPERDAL ) tablet 1 mg  1 mg Oral QHS Zylah Elsbernd L, PA-C   1 mg at 05/29/24 2140   simvastatin  (ZOCOR ) tablet 40 mg  40 mg Oral q1800 [name], [name], MD   40 mg at 05/30/24 1708   traZODone  (DESYREL ) tablet 100 mg  100 mg Oral QHS [name], [name], NP   100 mg at 05/29/24 2140    Lab Results:  Results for orders placed or performed during the hospital encounter of 05/21/24 (from the past 48 hours)  Lithium  level     Status: None   Collection Time: 05/30/24  9:21 AM  Result Value Ref Range   Lithium  Lvl 0.67 0.60 - 1.20 mmol/L    Comment: Performed at [facility], [address] [place], [place] [address]  Comprehensive metabolic panel     Status: Abnormal   Collection Time: 05/30/24  9:21 AM  Result Value Ref Range   Sodium 138 135 - 145 mmol/L   Potassium 5.0 3.5 - 5.1 mmol/L   Chloride 103 98 - 111 mmol/L   CO2 25 22 - 32 mmol/L   Glucose, Bld 289 (H) 70 - 99 mg/dL    Comment: Glucose reference range applies only to samples taken after fasting for at least 8 hours.   BUN 29 (H) 6 - 20 mg/dL   Creatinine, Ser 8.80 0.61 - 1.24 mg/dL   Calcium 89.7 8.9 - 89.6 mg/dL   Total Protein 6.4 (L) 6.5 - 8.1 g/dL   Albumin 4.3 3.5 - 5.0 g/dL   AST 18 15 - 41 U/L   ALT 23 0 - 44 U/L   Alkaline Phosphatase 65 38 - 126 U/L   Total Bilirubin 0.2 0.0 - 1.2 mg/dL   GFR,  Estimated >39 >39 mL/min    Comment: (NOTE) Calculated using the CKD-EPI Creatinine Equation (2021)    Anion gap 10 5 - 15    Comment: Performed at [facility], [address] [place], [place] [address]      Blood Alcohol level:  Lab Results  Component Value Date   [facility] <15 05/20/2024   [facility] <15 05/19/2024    Metabolic Disorder Labs: Lab Results  Component Value Date   HGBA1C 5.7 (H) 05/22/2024   MPG 116.89 05/22/2024   MPG 93.93 09/10/2023   Lab Results  Component Value Date   PROLACTIN 2.0 (L) 08/31/2016   PROLACTIN 4.8 05/22/2016   Lab Results  Component Value Date   CHOL 160 05/22/2024  TRIG 310 (H) 05/22/2024   HDL 36 (L) 05/22/2024   CHOLHDL 4.4 05/22/2024   VLDL 62 (H) 05/22/2024   LDLCALC 62 05/22/2024   LDLCALC 68 05/02/2024    Physical Findings: AIMS:  , ,  ,  ,    CIWA:    COWS:      Psychiatric Specialty Exam:  Presentation  General Appearance:  Disheveled  Eye Contact: Fair  Speech: Clear and Coherent  Speech Volume: Normal    Mood and Affect  Mood: Dysphoric  Affect: Congruent   Thought Process  Thought Processes: Coherent  Orientation:Full (Time, Place and Person)  Thought Content:WDL  Hallucinations: Denies  Ideas of Reference:None  Suicidal Thoughts: Passive, without intent, without plan  Homicidal Thoughts: Denies   Sensorium  Memory: Immediate Fair; Recent Fair  Judgment: Fair  Insight: Lacking   Executive Functions  Concentration: Fair  Attention Span: Fair  Recall: [employer] of Knowledge: Fair  Language: Fair   Psychomotor Activity  Psychomotor Activity: Normal  Musculoskeletal: Strength & Muscle Tone: decreased Gait & Station: unsteady Assets  Assets: Manufacturing Systems Engineer; Desire for Improvement    Physical Exam: Physical Exam Vitals and nursing note reviewed.  Constitutional:      Appearance: Normal appearance.  Pulmonary:     Effort: Pulmonary  effort is normal.  Neurological:     Mental Status: He is alert and oriented to person, place, and time.  Psychiatric:        Mood and Affect: Mood normal.        Behavior: Behavior normal.    Review of Systems  Respiratory:  Negative for shortness of breath.   Cardiovascular:  Negative for chest pain.  Psychiatric/Behavioral:  Negative for depression, hallucinations and suicidal ideas. The patient is nervous/anxious.    Blood pressure 126/77, pulse 95, temperature 97.8 F (36.6 C), resp. rate 18, height 5' 10 (1.778 m), weight 86.3 kg, SpO2 99%. Body mass index is 27.3 kg/m.  Diagnosis: Principal Problem:   Schizophrenia, paranoid (HCC)   reatment Plan Summary:  Safety and Monitoring:             -- Involuntary admission to inpatient psychiatric unit for safety, stabilization and treatment             -- Daily contact with patient to assess and evaluate symptoms and progress in treatment             -- Patient's case to be discussed in multi-disciplinary team meeting             -- Observation Level: q15 minute checks             -- Vital signs:  q12 hours             -- Precautions: suicide, elopement, and assault   2. Psychiatric Diagnoses and Treatment:               Klonopin  0.5 mg once daily in the morning, 1mg  once daily in the evening - monitor for sedation Clozaril  250 mg daily at bedtime Lithium  ER 450 mg daily at bedtime - Renal function labs collected - BUN remains elevated at 29; GFR >60; Creatinine WNL; Lithium  level 0.67 on 12/16 Risperidone  to 1 mg at bedtime Trazodone  100 mg daily at bedtime Cogentin  0.5 mg once daily for potential akathisia (patient describing as anxieties with a consistent feeling/need for movement)    -- The risks/benefits/side-effects/alternatives to this medication were discussed in detail with the patient and time  was given for questions. The patient consents to medication trial.                -- Metabolic profile and EKG  monitoring obtained while on an atypical antipsychotic (BMI: Lipid Panel: HbgA1c: QTc:)              -- Encouraged patient to participate in unit milieu and in scheduled group therapies                            3. Medical Issues Being Addressed:  No acute concerns. Lactulose  for constipation.   4. Discharge Planning:   -- Social work and case management to assist with discharge planning and identification of hospital follow-up needs prior to discharge  -- Estimated LOS: 7-10 days  Discussed with attending physician, Dr. [name], who is in agreement with plan. Lief Palmatier LITTIE Lukes, PA-C 05/30/2024, 6:01 PM

## 2024-05-30 NOTE — Group Note (Signed)
 Date:  05/30/2024 Time:  11:08 AM  Group Topic/Focus:  Healthy Communication:   The focus of this group is to discuss communication, barriers to communication, as well as healthy ways to communicate with others.    Participation Level:  Did Not Attend  Participation Quality:    Affect:    Cognitive:    Insight:   Engagement in Group:    Modes of Intervention:    Additional Comments:    Petros Ahart 05/30/2024, 11:08 AM

## 2024-05-30 NOTE — Progress Notes (Signed)
 Patient denies SI/HI/A/VH and verbally contact for safety. Endorses anxiety Prn Vistaril  given with little effect this am. Patient is isolative to his room. Q 15 minutes safety checks provided.

## 2024-05-31 DIAGNOSIS — F200 Paranoid schizophrenia: Secondary | ICD-10-CM | POA: Diagnosis not present

## 2024-05-31 LAB — GLUCOSE, CAPILLARY: Glucose-Capillary: 147 mg/dL — ABNORMAL HIGH (ref 70–99)

## 2024-05-31 NOTE — Progress Notes (Signed)
 Pt denies SI/HI/AVH at this time. He reports anxiety. Med compliant. Q 15 min safety checks in place.  05/30/24 2119  Psych Admission Type (Psych Patients Only)  Admission Status Involuntary  Psychosocial Assessment  Patient Complaints Anxiety  Eye Contact Brief  Facial Expression Anxious  Affect Appropriate to circumstance  Speech Soft  Interaction Minimal  Motor Activity Slow  Appearance/Hygiene In scrubs  Behavior Characteristics Appropriate to situation  Mood Pleasant  Thought Process  Coherency WDL  Content WDL  Delusions None reported or observed  Perception WDL  Hallucination None reported or observed  Judgment Poor  Confusion WDL  Danger to Self  Current suicidal ideation? Denies  Self-Injurious Behavior No self-injurious ideation or behavior indicators observed or expressed   Agreement Not to Harm Self Yes  Description of Agreement Verbal  Danger to Others  Danger to Others None reported or observed

## 2024-05-31 NOTE — Plan of Care (Signed)

## 2024-05-31 NOTE — Plan of Care (Signed)
   Problem: Education: Goal: Emotional status will improve Outcome: Progressing Goal: Mental status will improve Outcome: Progressing Goal: Verbalization of understanding the information provided will improve Outcome: Progressing

## 2024-05-31 NOTE — Progress Notes (Signed)
°   05/31/24 2100  Psych Admission Type (Psych Patients Only)  Admission Status Involuntary  Psychosocial Assessment  Patient Complaints Anxiety  Eye Contact Fair  Facial Expression Animated  Affect Appropriate to circumstance  Speech Soft  Interaction Minimal  Motor Activity Slow  Appearance/Hygiene Improved  Behavior Characteristics Appropriate to situation;Cooperative  Mood Pleasant  Thought Process  Coherency WDL  Content WDL  Delusions None reported or observed  Perception WDL  Hallucination None reported or observed  Judgment Poor  Confusion None  Danger to Self  Current suicidal ideation? Denies  Self-Injurious Behavior No self-injurious ideation or behavior indicators observed or expressed   Agreement Not to Harm Self Yes  Description of Agreement verbal  Danger to Others  Danger to Others None reported or observed

## 2024-05-31 NOTE — Plan of Care (Signed)
   Problem: Education: Goal: Knowledge of [facility] Education information/materials will improve Outcome: Progressing   Problem: Activity: Goal: Interest or engagement in activities will improve Outcome: Progressing   Problem: Coping: Goal: Ability to verbalize frustrations and anger appropriately will improve Outcome: Progressing   Problem: Safety: Goal: Periods of time without injury will increase Outcome: Progressing

## 2024-05-31 NOTE — Progress Notes (Signed)
 Patient denies SI/HI./A/VH endorses anxiety Patient was educated on medication and advised that he had a new order of Klonopin  in am  and pm. Patient appreciative. Support ongoing.

## 2024-05-31 NOTE — BHH Counselor (Signed)
 CSW sat with pt for meeting with [facility] APS worker, Elease. Pt and worker discussed his disposition. Pt expressed that he would be happy if he could get somewhere else as he does not know where he will go now that the family care home is evicting him. He was informed that worker would have to be in communication with his guardian regarding where he was going. He agreed. No other concerns expressed. Contact ended without incident.   Nadara SAUNDERS. Chaim, MSW, LCSW, [name] 05/31/2024 11:15 AM

## 2024-05-31 NOTE — Progress Notes (Incomplete)
 [facility] MD Progress Note  05/31/2024 11:43 PM Lance [name]  MRN:  [id number]   Subjective:  Chart reviewed, case discussed in multidisciplinary meeting, patient seen during rounds.     Past Psychiatric History: see h&P Family History:  Family History  Problem Relation Age of Onset   CAD Mother    Rheum arthritis Mother    Healthy Father    CAD Sister    Obesity Sister    Social History:  Social History   Substance and Sexual Activity  Alcohol Use Not Currently   Comment: occassionally     Social History   Substance and Sexual Activity  Drug Use Yes   Types: Marijuana   Comment: Patient reports occasionally    Social History   Socioeconomic History   Marital status: Single    Spouse name: Not on file   Number of children: Not on file   Years of education: Not on file   Highest education level: Not on file  Occupational History   Not on file  Tobacco Use   Smoking status: Every Day    Current packs/day: 1.00    Average packs/day: 1 pack/day for 23.0 years (23.0 ttl pk-yrs)    Types: Cigarettes   Smokeless tobacco: Never   Tobacco comments:    will provide material  Vaping Use   Vaping status: Every Day  Substance and Sexual Activity   Alcohol use: Not Currently    Comment: occassionally   Drug use: Yes    Types: Marijuana    Comment: Patient reports occasionally   Sexual activity: Not Currently    Birth control/protection: None    Comment: occasional marijuana- none recently  Other Topics Concern   Not on file  Social History Narrative   From a group home in [place]   Social Drivers of Health   Tobacco Use: High Risk (05/21/2024)   Patient History    Smoking Tobacco Use: Every Day    Smokeless Tobacco Use: Never    Passive Exposure: Not on file  Financial Resource Strain: Not on file  Food Insecurity: No Food Insecurity (05/21/2024)   Epic    Worried About Programme Researcher, Broadcasting/film/video in the Last Year: Never true    Ran Out of Food in the Last  Year: Never true  Transportation Needs: No Transportation Needs (05/21/2024)   Epic    Lack of Transportation (Medical): No    Lack of Transportation (Non-Medical): No  Physical Activity: Not on file  Stress: Not on file  Social Connections: Not on file  Depression (EYV7-0): Not on file  Alcohol Screen: Low Risk (05/21/2024)   Alcohol Screen    Last Alcohol Screening Score (AUDIT): 0  Housing: Low Risk (05/21/2024)   Epic    Unable to Pay for Housing in the Last Year: No    Number of Times Moved in the Last Year: 1    Homeless in the Last Year: No  Utilities: Not At Risk (05/21/2024)   Epic    Threatened with loss of utilities: No  Health Literacy: Not on file   Past Medical History:  Past Medical History:  Diagnosis Date   Depression    Diabetes mellitus without complication (HCC)    Hyperlipidemia    Hypertension    Lupus anticoagulant disorder 06/14/2012   as per Dr.pandit's note in dec 2013.   PE (pulmonary thromboembolism) (HCC)    Schizo affective schizophrenia (HCC)    Supratherapeutic INR 11/19/2016   History reviewed. No  pertinent surgical history.  Current Medications: Current Facility-Administered Medications  Medication Dose Route Frequency Provider Last Rate Last Admin   acetaminophen  (TYLENOL ) tablet 650 mg  650 mg Oral Q6H PRN [name], [name], NP   650 mg at 05/22/24 1203   albuterol  (VENTOLIN  HFA) 108 (90 Base) MCG/ACT inhaler 2 puff  2 puff Inhalation Q6H PRN [name], [name], NP       alum & mag hydroxide-simeth (MAALOX/MYLANTA) 200-200-20 MG/5ML suspension 30 mL  30 mL Oral Q4H PRN [name], [name], NP       apixaban  (ELIQUIS ) tablet 2.5 mg  2.5 mg Oral BID [name], [name], NP   2.5 mg at 05/31/24 1721   benztropine  (COGENTIN ) tablet 0.5 mg  0.5 mg Oral Daily May, Tanya, NP   0.5 mg at 05/31/24 9165   clonazePAM  (KLONOPIN ) tablet 0.5 mg  0.5 mg Oral Daily Bereket Gernert L, PA-C   0.5 mg at 05/31/24 9165   clonazePAM  (KLONOPIN ) tablet 1 mg  1 mg Oral Daily Fransheska Willingham,  Caira Poche L, PA-C   1 mg at 05/31/24 1721   cloZAPine  (CLOZARIL ) tablet 250 mg  250 mg Oral QHS [name], [name], NP   250 mg at 05/31/24 2107   haloperidol  (HALDOL ) tablet 5 mg  5 mg Oral TID PRN [name], [name], NP   5 mg at 05/22/24 1424   And   diphenhydrAMINE  (BENADRYL ) capsule 50 mg  50 mg Oral TID PRN [name], [name], NP   50 mg at 05/22/24 1424   haloperidol  lactate (HALDOL ) injection 5 mg  5 mg Intramuscular TID PRN [name], [name], NP   5 mg at 05/21/24 1541   And   diphenhydrAMINE  (BENADRYL ) injection 50 mg  50 mg Intramuscular TID PRN [name], [name], NP       And   LORazepam  (ATIVAN ) injection 2 mg  2 mg Intramuscular TID PRN [name], [name], NP       haloperidol  lactate (HALDOL ) injection 10 mg  10 mg Intramuscular TID PRN [name], [name], NP       And   diphenhydrAMINE  (BENADRYL ) injection 50 mg  50 mg Intramuscular TID PRN [name], [name], NP   50 mg at 05/21/24 1541   And   LORazepam  (ATIVAN ) injection 2 mg  2 mg Intramuscular TID PRN [name], [name], NP   2 mg at 05/21/24 1540   feeding supplement (GLUCERNA SHAKE) (GLUCERNA SHAKE) liquid 237 mL  237 mL Oral TID BM [name], [name], MD   237 mL at 05/31/24 2108   fluticasone  furoate-vilanterol (BREO ELLIPTA ) 200-25 MCG/ACT 1 puff  1 puff Inhalation Daily [name], [name], NP   1 puff at 05/31/24 9162   hydrOXYzine  (ATARAX ) tablet 25 mg  25 mg Oral Daily PRN [name], [name], NP   25 mg at 05/30/24 9173   lactulose  (CHRONULAC ) 10 GM/15ML solution 20 g  20 g Oral BID PRN [name], [name], MD   20 g at 05/25/24 1533   lisinopril  (ZESTRIL ) tablet 2.5 mg  2.5 mg Oral Daily [name], [name], NP   2.5 mg at 05/31/24 9164   lithium  carbonate (ESKALITH ) ER tablet 450 mg  450 mg Oral QHS [name], [name], NP   450 mg at 05/31/24 2108   metFORMIN  (GLUCOPHAGE ) tablet 1,000 mg  1,000 mg Oral BID WC [name], [name], NP   1,000 mg at 05/31/24 1721   risperiDONE  (RISPERDAL ) tablet 1 mg  1 mg Oral QHS Mannix Kroeker L, PA-C  1 mg at 05/31/24 2107   simvastatin   (ZOCOR ) tablet 40 mg  40 mg Oral q1800 [name], [name], MD   40 mg at 05/31/24 1722   traZODone  (DESYREL ) tablet 100 mg  100 mg Oral QHS [name], [name], NP   100 mg at 05/31/24 2108    Lab Results:  Results for orders placed or performed during the hospital encounter of 05/21/24 (from the past 48 hours)  Lithium  level     Status: None   Collection Time: 05/30/24  9:21 AM  Result Value Ref Range   Lithium  Lvl 0.67 0.60 - 1.20 mmol/L    Comment: Performed at [facility], [address] [place], [place] [address]  Comprehensive metabolic panel     Status: Abnormal   Collection Time: 05/30/24  9:21 AM  Result Value Ref Range   Sodium 138 135 - 145 mmol/L   Potassium 5.0 3.5 - 5.1 mmol/L   Chloride 103 98 - 111 mmol/L   CO2 25 22 - 32 mmol/L   Glucose, Bld 289 (H) 70 - 99 mg/dL    Comment: Glucose reference range applies only to samples taken after fasting for at least 8 hours.   BUN 29 (H) 6 - 20 mg/dL   Creatinine, Ser 8.80 0.61 - 1.24 mg/dL   Calcium 89.7 8.9 - 89.6 mg/dL   Total Protein 6.4 (L) 6.5 - 8.1 g/dL   Albumin 4.3 3.5 - 5.0 g/dL   AST 18 15 - 41 U/L   ALT 23 0 - 44 U/L   Alkaline Phosphatase 65 38 - 126 U/L   Total Bilirubin 0.2 0.0 - 1.2 mg/dL   GFR, Estimated >39 >39 mL/min    Comment: (NOTE) Calculated using the CKD-EPI Creatinine Equation (2021)    Anion gap 10 5 - 15    Comment: Performed at [facility], [name] [place], [place] [address]  Glucose, capillary     Status: Abnormal   Collection Time: 05/31/24  7:32 AM  Result Value Ref Range   Glucose-Capillary 147 (H) 70 - 99 mg/dL    Comment: Glucose reference range applies only to samples taken after fasting for at least 8 hours.   Comment 1 Notify RN     Blood Alcohol level:  Lab Results  Component Value Date   [facility] <15 05/20/2024   ETH <15 05/19/2024    Metabolic Disorder Labs: Lab Results  Component Value Date   HGBA1C 5.7 (H) 05/22/2024   MPG 116.89 05/22/2024    MPG 93.93 09/10/2023   Lab Results  Component Value Date   PROLACTIN 2.0 (L) 08/31/2016   PROLACTIN 4.8 05/22/2016   Lab Results  Component Value Date   CHOL 160 05/22/2024   TRIG 310 (H) 05/22/2024   HDL 36 (L) 05/22/2024   CHOLHDL 4.4 05/22/2024   VLDL 62 (H) 05/22/2024   LDLCALC 62 05/22/2024   LDLCALC 68 05/02/2024    Physical Findings: AIMS:  , ,  ,  ,    CIWA:    COWS:      Psychiatric Specialty Exam:  Presentation  General Appearance:  Disheveled  Eye Contact: Poor  Speech: Clear and Coherent  Speech Volume: Normal    Mood and Affect  Mood: Dysphoric  Affect: Congruent   Thought Process  Thought Processes: Coherent  Orientation:Full (Time, Place and Person)  Thought Content:WDL  Hallucinations:No data recorded Ideas of Reference:None  Suicidal Thoughts:No data recorded Homicidal Thoughts:No data recorded  Sensorium  Memory:  Immediate Fair; Recent Fair  Judgment: Fair  Insight: Lacking   Art Therapist  Concentration: Fair  Attention Span: Fair  Recall: [employer] of Knowledge: Fair  Language: Fair   Psychomotor Activity  Psychomotor Activity:No data recorded Musculoskeletal: Strength & Muscle Tone: {desc; muscle tone:32375} Gait & Station: {PE GAIT ED WJUO:77474} Assets  Assets: Communication Skills; Desire for Improvement    Physical Exam: Physical Exam ROS Blood pressure 120/81, pulse 95, temperature 97.8 F (36.6 C), temperature source Oral, resp. rate 18, height 5' 10 (1.778 m), weight 86.3 kg, SpO2 99%. Body mass index is 27.3 kg/m.  Diagnosis: Principal Problem:   Schizophrenia, paranoid (HCC)   PLAN: Safety and Monitoring:  -- Voluntary admission to inpatient psychiatric unit for safety, stabilization and treatment  -- Daily contact with patient to assess and evaluate symptoms and progress in treatment  -- Patient's case to be discussed in multi-disciplinary team meeting  --  Observation Level : q15 minute checks  -- Vital signs:  q12 hours  -- Precautions: suicide, elopement, and assault -- Encouraged patient to participate in unit milieu and in scheduled group therapies  2. Psychiatric Treatment:  Scheduled Medications:     -- The risks/benefits/side-effects/alternatives to this medication were discussed in detail with the patient and time was given for questions. The patient consents to medication trial.  3. Medical Issues Being Addressed:     4. Discharge Planning:   -- Social work and case management to assist with discharge planning and identification of hospital follow-up needs prior to discharge  -- Estimated LOS: 3-4 days  Camelia LITTIE Lukes, PA-C 05/31/2024, 11:43 PM

## 2024-05-31 NOTE — Group Note (Signed)
 Date:  05/31/2024 Time:  10:14 AM  Group Topic/Focus:  Goals Group:   The focus of this group is to help patients establish daily goals to achieve during treatment and discuss how the patient can incorporate goal setting into their daily lives to aide in recovery.    Participation Level:  Did Not Attend   Deitra Clap [facility] 05/31/2024, 10:14 AM

## 2024-05-31 NOTE — Group Note (Signed)
 Date:  05/31/2024 Time:  8:41 PM  Group Topic/Focus:  Wrap-Up Group:   The focus of this group is to help patients review their daily goal of treatment and discuss progress on daily workbooks.    Participation Level:  Did Not Attend    Lance [name] 05/31/2024, 8:41 PM

## 2024-05-31 NOTE — Group Note (Signed)
 [facility] LCSW Group Therapy Note   Group Date: 05/31/2024 Start Time: 1300 End Time: 1400   Type of Therapy/Topic:  Group Therapy:  Emotion Regulation  Participation Level:  Active   Description of Group:    The purpose of this group is to assist patients in learning to regulate negative emotions and experience positive emotions. Patients will be guided to discuss ways in which they have been vulnerable to their negative emotions. These vulnerabilities will be juxtaposed with experiences of positive emotions or situations, and patients challenged to use positive emotions to combat negative ones. Special emphasis will be placed on coping with negative emotions in conflict situations, and patients will process healthy conflict resolution skills.  Therapeutic Goals: Patient will identify two positive emotions or experiences to reflect on in order to balance out negative emotions:  Patient will label two or more emotions that they find the most difficult to experience:  Patient will be able to demonstrate positive conflict resolution skills through discussion or role plays:   Summary of Patient Progress: Patient was present for the entirety of the group discussion. He was actively engaged with the topic and his comments helped further the discussion. Pt appeared to have slight insight into the topic. He appeared open and receptive to feedback/comments from both his peers and the facilitator.  Therapeutic Modalities:   Cognitive Behavioral Therapy Feelings Identification Dialectical Behavioral Therapy   Nadara JONELLE Fam, LCSW

## 2024-06-01 DIAGNOSIS — F200 Paranoid schizophrenia: Secondary | ICD-10-CM | POA: Diagnosis not present

## 2024-06-01 MED ORDER — LITHIUM CARBONATE ER 300 MG PO TBCR
600.0000 mg | EXTENDED_RELEASE_TABLET | Freq: Every day | ORAL | Status: DC
  Administered 2024-06-01 – 2024-06-05 (×5): 600 mg via ORAL
  Filled 2024-06-01 (×6): qty 2

## 2024-06-01 MED ORDER — HYDROXYZINE HCL 25 MG PO TABS
25.0000 mg | ORAL_TABLET | Freq: Three times a day (TID) | ORAL | Status: DC | PRN
  Administered 2024-06-01 – 2024-06-06 (×4): 25 mg via ORAL
  Filled 2024-06-01 (×4): qty 1

## 2024-06-01 NOTE — Group Note (Signed)
 Date:  06/01/2024 Time:  10:10 AM  Group Topic/Focus:  Emotional Education:   The focus of this group is to discuss what feelings/emotions are, and how they are experienced.    Participation Level:  Active  Participation Quality:  Appropriate  Affect:  Appropriate  Cognitive:  Appropriate  Insight: Appropriate  Engagement in Group:  Engaged  Modes of Intervention:  Activity  Additional Comments:    [name] Adeola Dennen 06/01/2024, 10:10 AM

## 2024-06-01 NOTE — Group Note (Signed)
 [facility] LCSW Group Therapy Note   Group Date: 06/01/2024 Start Time: 1300 End Time: 1400   Type of Therapy/Topic:  Group Therapy:  Balance in Life  Participation Level:  Active   Description of Group:    This group will address the concept of balance and how it feels and looks when one is unbalanced. Patients will be encouraged to process areas in their lives that are out of balance, and identify reasons for remaining unbalanced. Facilitators will guide patients utilizing problem- solving interventions to address and correct the stressor making their life unbalanced. Understanding and applying boundaries will be explored and addressed for obtaining  and maintaining a balanced life. Patients will be encouraged to explore ways to assertively make their unbalanced needs known to significant others in their lives, using other group members and facilitator for support and feedback.  Therapeutic Goals: Patient will identify two or more emotions or situations they have that consume much of in their lives. Patient will identify signs/triggers that life has become out of balance:  Patient will identify two ways to set boundaries in order to achieve balance in their lives:  Patient will demonstrate ability to communicate their needs through discussion and/or role plays  Summary of Patient Progress: Patient was present for the entirety of the group process. He was actively engaged in the discussion. Pt appeared to have slight insight into himself. He appeared open and receptive to feedback/comments from both his peers and the facilitator.  Therapeutic Modalities:   Cognitive Behavioral Therapy Solution-Focused Therapy Assertiveness Training   Nadara JONELLE Fam, LCSW

## 2024-06-01 NOTE — Group Note (Signed)
 Recreation Therapy Group Note   Group Topic:Goal Setting  Group Date: 06/01/2024 Start Time: 1000 End Time: 1050 Facilitators: Celestia Jeoffrey BRAVO, LRT, CTRS Location: Craft Room  Group Description: Product/process Development Scientist. Patients were given many different magazines, a glue stick, markers, and a piece of cardstock paper. LRT and pts discussed the importance of having goals in life. LRT and pts discussed the difference between short-term and long-term goals, as well as what a SMART goal is. LRT encouraged pts to create a vision board, with images they picked and then cut out with safety scissors from the magazine, for themselves, that capture their short and long-term goals. LRT encouraged pts to show and explain their vision board to the group.   Goal Area(s) Addressed:  Patient will gain knowledge of short vs. long term goals.  Patient will identify goals for themselves. Patient will practice setting SMART goals. Patient will verbalize their goals to LRT and peers.  Affect/Mood: N/A   Participation Level: Did not attend    Clinical Observations/Individualized Feedback: Patient did not attend.  Plan: Continue to engage patient in RT group sessions 2-3x/week.   Jeoffrey BRAVO Celestia, LRT, CTRS 06/01/2024 11:40 AM

## 2024-06-01 NOTE — Group Note (Signed)
 Date:  06/01/2024 Time:  5:10 PM  Group Topic/Focus:  Activity Group: The focus of the group is to promote activity for the patients and encourage them to go outside to the courtyard and get some fresh air and exercise.    Participation Level:  Active  Participation Quality:  Appropriate  Affect:  Appropriate  Cognitive:  Appropriate  Insight: Appropriate  Engagement in Group:  Engaged  Modes of Intervention:  Activity  Additional Comments:    [name] Lance [name] 06/01/2024, 5:10 PM

## 2024-06-01 NOTE — Progress Notes (Signed)
 Pt   pleasant and cooperative this shift. Pt given Atarax  x 1 with positive affect. Pt only has 1 dose daily available, request sent to add additional dose for evening time. Pt denied SI/HI/AVH. Pt participated in group, outside time, and other activities. Pt states he feels like the medication is working and is wondering when he will be leaving.        06/01/24 1000  Psych Admission Type (Psych Patients Only)  Admission Status Involuntary  Psychosocial Assessment  Patient Complaints Hopelessness;Sadness  Eye Contact Brief  Facial Expression Sad  Affect Depressed  Speech Soft  Interaction Other (Comment) (appropriate)  Motor Activity Slow  Appearance/Hygiene In scrubs  Behavior Characteristics Cooperative;Appropriate to situation  Mood Depressed  Thought Process  Coherency WDL  Content WDL  Delusions None reported or observed  Perception WDL  Hallucination None reported or observed  Judgment Poor  Confusion None  Danger to Self  Current suicidal ideation? Denies  Self-Injurious Behavior No self-injurious ideation or behavior indicators observed or expressed   Agreement Not to Harm Self Yes  Description of Agreement verbal  Danger to Others  Danger to Others None reported or observed

## 2024-06-01 NOTE — Plan of Care (Signed)

## 2024-06-01 NOTE — Group Note (Signed)
 Recreation Therapy Group Note   Group Topic:General Recreation  Group Date: 06/01/2024 Start Time: 1525 End Time: 1545 Facilitators: Celestia Jeoffrey BRAVO, LRT, CTRS Location: Courtyard  Group Description: [employer]. LRT and patients played games of basketball, drew with chalk, and played corn hole while outside in the courtyard while getting fresh air and sunlight. Music was being played in the background. LRT and peers conversed about different games they have played before, what they do in their free time and anything else that is on their minds. LRT encouraged pts to drink water after being outside, sweating and getting their heart rate up.  Goal Area(s) Addressed: Patient will build on frustration tolerance skills. Patients will partake in a competitive play game with peers. Patients will gain knowledge of new leisure interest/hobby.    Affect/Mood: Appropriate and Flat   Participation Level: Moderate    Clinical Observations/Individualized Feedback: Lance [name] was in and out of group. Pt was appropriate while present.   Plan: Continue to engage patient in RT group sessions 2-3x/week.   Jeoffrey BRAVO Celestia, LRT, CTRS 06/01/2024 4:18 PM

## 2024-06-01 NOTE — Group Note (Signed)
 Date:  06/01/2024 Time:  9:24 PM  Group Topic/Focus:  Managing Feelings:   The focus of this group is to identify what feelings patients have difficulty handling and develop a plan to handle them in a healthier way upon discharge.    Participation Level:  Active  Participation Quality:  Appropriate  Affect:  Appropriate  Cognitive:  Appropriate  Insight: Appropriate  Engagement in Group:  Distracting  Modes of Intervention:  Discussion  Additional Comments:    Lance [name] L 06/01/2024, 9:24 PM

## 2024-06-01 NOTE — Progress Notes (Signed)
 [facility] MD Progress Note  06/01/2024 12:54 PM Lance [name]  MRN:  [id number]   Lance [name] is a 48 y.o. male admitted: Presented to the ED for 05/20/2024 3:15 PM for Patient arrived with IVC papers. Patient was transported to the crisis center by law enforcement after being found lying in front of tobacco store. Upon contact, he reported hearing voices. SABRA He carries the psychiatric diagnoses of Schizophrenia and has a past medical history of  Chart review significant for diabetes mellitus, hx of pulmonary embolus. Patient is admitted to adult psych unit with Q15 min safety monitoring. Multidisciplinary team approach is offered. Medication management; group/milieu therapy is offered.  Subjective:  Chart reviewed, case discussed in multidisciplinary meeting, patient seen during rounds.  06/01/24: Patient is noted to be resting in his bed.  He reports doing better today.  He rates his depression as 8 out of 10, 10 being the worst and anxiety is 4 out of 10.  He reports that the current stressor is the group home refusing to take him back after 30 days and the possibility of him going to prison.  He was very focused on going to prison and wanted to know if he will be receiving the mental health treatment in the prison and continue his medication.  He reports chronic suicidal ideations stating he struggled with those for a long time but clarifies that they are passive SI with no intent or plan.  He denies auditory/visual hallucinations.  He reports having fair appetite and sleep.  He is noted to be participating in groups and is calm and cooperative.  12/17: On interview today, patient is found in the hallway, he returns to room to engage in interview with provider.  He reports some improvement in anxiety with addition of morning Klonopin  dose.  Per nursing report, patient has been more visible in the milieu.  Patient reports tolerating current medication regimen well without adverse concerns, he denies  increased sedation.  He rates depression as 8 out of 10 today.  He states anxiety continues to fluctuate throughout the day and at worst is 9 out of 10.  He continues to report passive suicidal ideation without intent or plan.  He is able to contract for safety in the current environment.  He denies HI/plan. He endorses auditory hallucinations and occasional visual hallucinations.  He denies command hallucinations.   12/16: On interview today, patient is noted to be resting in bed.  He reports passive SI without intent or plan.  He is able to contract for safety in the current environment.  He denies HI/plan.  He denies current auditory hallucinations, states last episode of this was a couple days ago where he was hearing hollering, screaming.  He denies current visual hallucinations.  He rates depression as 6-7 out of 10 and anxiety at 6 out of 10 today.  He reports fair sleep and good appetite.  He states anxiety fluctuates throughout the day.  He is tolerating current medication regimen well without adverse effects.  He is agreeable to adding Klonopin  dose in the morning.   December 15 patient is reporting that he is feeling better somewhat anxious no perceptual disturbances 12/14: Patient found laying in room for interview. Patient sleeping prior to interview. Upon waking, patient reports he is still having anxieties, a lot of anxieties. Discussed further and patient was able to express a feeling of having to move, shake his leg, or do something consistently (identified these as anxieties). Due to medication  regimen, patient at higher risk for Akathisia - consistent movement noted when interviewing patient. Discussed Akathisia and restlessness. Discussed treatment options and patient was open to cogentin .    Denies SI, HI, and AVH. Denies other concerns/complaints.    12/13: Patient found laying in room. He reports he is having a lot of anxieties. When asked further patient is unable to explain  anxiety, just reporting it happens when he wakes up and is consistent until he sleeps. He reports he tries to sleep through it.    Denies other concerns complaints. Declines changes to medication regimen at this time. Denies SI, HI, and AVH.    12/12: On interview today, patient is noted to be more awake and alert.  He reports continued improvement in daytime tiredness.  He is noted to be more interactive on the unit.  He is tolerating medication regimen well.  He rates depression as 7 out of 10 and anxiety at 8 out of 10.  He denies SI/HI/plan.  He denies hallucinations at time of interview.  Will consider lithium  dose increase pending renal labs, ordered today.  Reports having bowel movement yesterday.   12/11: On interview today, patient is found resting in bed.  He reports daytime drowsiness is improving.  Will decrease Klonopin  to 1 mg once daily in the evening and Risperdal  1 mg at bedtime due to daytime drowsiness.  He reports good sleep and stable appetite.  He endorses depressive symptoms but denies anxiety at this time.  He denies SI/HI/plan and denies hallucinations at time of interview.    12/10: On interview today, patient is noted to be resting in bed with one-to-one sitter in the room.  Patient is noted to be drowsy, but wakes on verbal commands.  He reports drowsiness during the day.  He reports good appetite.  He denies current SI at this time.  He denies HI/plan.  He endorses occasional auditory hallucinations but denies command hallucinations.  He endorses visual hallucinations of seeing faces.  He rates anxiety as 7 out of 10 and depression as 7-8 out of 10 today.   12/9: Patient is noted to be resting in bed with one-to-one sitter in the room.  Patient is noted to be very drowsy.  He was able to wake up for the verbal commands.  He reports that he is feeling out of it.  He reports having abdominal pain on the left side.  Being oncological patient encouraged to inform the staff if he  is constipated.  Patient reports having bowel movements.  Patient denies having any urinary symptoms.  Patient vitals are stable except for low blood pressure.  Patient received increased dose of Klonopin  during this admission 2 mg twice daily.  Patient reports ongoing thoughts of suicide but denies having any active plan.  Patient denies auditory/visual hallucinations.  Discussed the plan to reduce the dosing of Klonopin , remove melatonin and discontinue propranolol  due to low blood pressure.  Past Psychiatric History: see h&P Family History:  Family History  Problem Relation Age of Onset   CAD Mother    Rheum arthritis Mother    Healthy Father    CAD Sister    Obesity Sister    Social History:  Social History   Substance and Sexual Activity  Alcohol Use Not Currently   Comment: occassionally     Social History   Substance and Sexual Activity  Drug Use Yes   Types: Marijuana   Comment: Patient reports occasionally    Social History  Socioeconomic History   Marital status: Single    Spouse name: Not on file   Number of children: Not on file   Years of education: Not on file   Highest education level: Not on file  Occupational History   Not on file  Tobacco Use   Smoking status: Every Day    Current packs/day: 1.00    Average packs/day: 1 pack/day for 23.0 years (23.0 ttl pk-yrs)    Types: Cigarettes   Smokeless tobacco: Never   Tobacco comments:    will provide material  Vaping Use   Vaping status: Every Day  Substance and Sexual Activity   Alcohol use: Not Currently    Comment: occassionally   Drug use: Yes    Types: Marijuana    Comment: Patient reports occasionally   Sexual activity: Not Currently    Birth control/protection: None    Comment: occasional marijuana- none recently  Other Topics Concern   Not on file  Social History Narrative   From a group home in [place]   Social Drivers of Health   Tobacco Use: High Risk (05/21/2024)   Patient  History    Smoking Tobacco Use: Every Day    Smokeless Tobacco Use: Never    Passive Exposure: Not on file  Financial Resource Strain: Not on file  Food Insecurity: No Food Insecurity (05/21/2024)   Epic    Worried About Programme Researcher, Broadcasting/film/video in the Last Year: Never true    Ran Out of Food in the Last Year: Never true  Transportation Needs: No Transportation Needs (05/21/2024)   Epic    Lack of Transportation (Medical): No    Lack of Transportation (Non-Medical): No  Physical Activity: Not on file  Stress: Not on file  Social Connections: Not on file  Depression (EYV7-0): Not on file  Alcohol Screen: Low Risk (05/21/2024)   Alcohol Screen    Last Alcohol Screening Score (AUDIT): 0  Housing: Low Risk (05/21/2024)   Epic    Unable to Pay for Housing in the Last Year: No    Number of Times Moved in the Last Year: 1    Homeless in the Last Year: No  Utilities: Not At Risk (05/21/2024)   Epic    Threatened with loss of utilities: No  Health Literacy: Not on file   Past Medical History:  Past Medical History:  Diagnosis Date   Depression    Diabetes mellitus without complication (HCC)    Hyperlipidemia    Hypertension    Lupus anticoagulant disorder 06/14/2012   as per Dr.pandit's note in dec 2013.   PE (pulmonary thromboembolism) (HCC)    Schizo affective schizophrenia (HCC)    Supratherapeutic INR 11/19/2016   History reviewed. No pertinent surgical history.  Current Medications: Current Facility-Administered Medications  Medication Dose Route Frequency Provider Last Rate Last Admin   acetaminophen  (TYLENOL ) tablet 650 mg  650 mg Oral Q6H PRN [name], [name], NP   650 mg at 05/22/24 1203   albuterol  (VENTOLIN  HFA) 108 (90 Base) MCG/ACT inhaler 2 puff  2 puff Inhalation Q6H PRN [name], [name], NP       alum & mag hydroxide-simeth (MAALOX/MYLANTA) 200-200-20 MG/5ML suspension 30 mL  30 mL Oral Q4H PRN [name], [name], NP       apixaban  (ELIQUIS ) tablet 2.5 mg  2.5 mg Oral BID [name],  [name], NP   2.5 mg at 06/01/24 9170   benztropine  (COGENTIN ) tablet 0.5 mg  0.5 mg Oral Daily  May, Tanya, NP   0.5 mg at 06/01/24 9170   clonazePAM  (KLONOPIN ) tablet 0.5 mg  0.5 mg Oral Daily [name], [name], PA-C   0.5 mg at 06/01/24 9170   clonazePAM  (KLONOPIN ) tablet 1 mg  1 mg Oral Daily [name], [name], PA-C   1 mg at 05/31/24 1721   cloZAPine  (CLOZARIL ) tablet 250 mg  250 mg Oral QHS [name], [name], NP   250 mg at 05/31/24 2107   haloperidol  (HALDOL ) tablet 5 mg  5 mg Oral TID PRN [name], [name], NP   5 mg at 05/22/24 1424   And   diphenhydrAMINE  (BENADRYL ) capsule 50 mg  50 mg Oral TID PRN [name], [name], NP   50 mg at 05/22/24 1424   haloperidol  lactate (HALDOL ) injection 5 mg  5 mg Intramuscular TID PRN [name], [name], NP   5 mg at 05/21/24 1541   And   diphenhydrAMINE  (BENADRYL ) injection 50 mg  50 mg Intramuscular TID PRN [name], [name], NP       And   LORazepam  (ATIVAN ) injection 2 mg  2 mg Intramuscular TID PRN [name], [name], NP       haloperidol  lactate (HALDOL ) injection 10 mg  10 mg Intramuscular TID PRN [name], [name], NP       And   diphenhydrAMINE  (BENADRYL ) injection 50 mg  50 mg Intramuscular TID PRN [name], [name], NP   50 mg at 05/21/24 1541   And   LORazepam  (ATIVAN ) injection 2 mg  2 mg Intramuscular TID PRN [name], [name], NP   2 mg at 05/21/24 1540   feeding supplement (GLUCERNA SHAKE) (GLUCERNA SHAKE) liquid 237 mL  237 mL Oral TID BM Chan Sheahan, MD   237 mL at 06/01/24 0837   fluticasone  furoate-vilanterol (BREO ELLIPTA ) 200-25 MCG/ACT 1 puff  1 puff Inhalation Daily [name], [name], NP   1 puff at 06/01/24 0833   hydrOXYzine  (ATARAX ) tablet 25 mg  25 mg Oral Daily PRN [name], [name], NP   25 mg at 06/01/24 0912   lactulose  (CHRONULAC ) 10 GM/15ML solution 20 g  20 g Oral BID PRN Melissa Tomaselli, MD   20 g at 05/25/24 1533   lisinopril  (ZESTRIL ) tablet 2.5 mg  2.5 mg Oral Daily [name], [name], NP   2.5 mg at 06/01/24 9171   lithium  carbonate (ESKALITH ) ER  tablet 450 mg  450 mg Oral QHS [name], [name], NP   450 mg at 05/31/24 2108   metFORMIN  (GLUCOPHAGE ) tablet 1,000 mg  1,000 mg Oral BID WC [name], [name], NP   1,000 mg at 06/01/24 0830   risperiDONE  (RISPERDAL ) tablet 1 mg  1 mg Oral QHS [name], [name], PA-C   1 mg at 05/31/24 2107   simvastatin  (ZOCOR ) tablet 40 mg  40 mg Oral q1800 [name], [name], MD   40 mg at 05/31/24 1722   traZODone  (DESYREL ) tablet 100 mg  100 mg Oral QHS [name], [name], NP   100 mg at 05/31/24 2108    Lab Results:  Results for orders placed or performed during the hospital encounter of 05/21/24 (from the past 48 hours)  Glucose, capillary     Status: Abnormal   Collection Time: 05/31/24  7:32 AM  Result Value Ref Range   Glucose-Capillary 147 (H) 70 - 99 mg/dL    Comment: Glucose reference range applies only to samples taken after fasting for at least 8 hours.   Comment 1  Notify RN     Blood Alcohol level:  Lab Results  Component Value Date   [facility] <15 05/20/2024   ETH <15 05/19/2024    Metabolic Disorder Labs: Lab Results  Component Value Date   HGBA1C 5.7 (H) 05/22/2024   MPG 116.89 05/22/2024   MPG 93.93 09/10/2023   Lab Results  Component Value Date   PROLACTIN 2.0 (L) 08/31/2016   PROLACTIN 4.8 05/22/2016   Lab Results  Component Value Date   CHOL 160 05/22/2024   TRIG 310 (H) 05/22/2024   HDL 36 (L) 05/22/2024   CHOLHDL 4.4 05/22/2024   VLDL 62 (H) 05/22/2024   LDLCALC 62 05/22/2024   LDLCALC 68 05/02/2024    Physical Findings: AIMS:  , ,  ,  ,    CIWA:    COWS:      Psychiatric Specialty Exam:  Presentation  General Appearance:  Disheveled  Eye Contact: Poor  Speech: Clear and Coherent  Speech Volume: Normal    Mood and Affect  Mood: Anxious   Affect: Congruent   Thought Process  Thought Processes: Coherent  Orientation:Full (Time, Place and Person)  Thought Content:WDL  Hallucinations: Auditory, visual  Ideas of Reference:None  Suicidal  Thoughts: Passive, without intent, without plan Homicidal Thoughts: Denies   Sensorium  Memory: Immediate Fair; Recent Fair  Judgment: Fair  Insight: Lacking   Executive Functions  Concentration: Fair  Attention Span: Fair  Recall: [employer] of Knowledge: Fair  Language: Fair   Psychomotor Activity  Psychomotor Activity: Normal Musculoskeletal: Strength & Muscle Tone: within normal limits Gait & Station: normal Assets  Assets: Manufacturing Systems Engineer; Desire for Improvement    Physical Exam: Physical Exam ROS Blood pressure 125/72, pulse 86, temperature 97.7 F (36.5 C), temperature source Oral, resp. rate 18, height 5' 10 (1.778 m), weight 86.3 kg, SpO2 98%. Body mass index is 27.3 kg/m.  Diagnosis: Principal Problem:   Schizophrenia, paranoid (HCC)   PLAN: Safety and Monitoring:  -- Voluntary admission to inpatient psychiatric unit for safety, stabilization and treatment  -- Daily contact with patient to assess and evaluate symptoms and progress in treatment  -- Patient's case to be discussed in multi-disciplinary team meeting  -- Observation Level : q15 minute checks  -- Vital signs:  q12 hours  -- Precautions: suicide, elopement, and assault -- Encouraged patient to participate in unit milieu and in scheduled group therapies  2. Psychiatric Treatment:  Scheduled Medications:  Klonopin  0.5 mg once daily in the morning, 1mg  once daily in the evening -monitor for sedation Clozaril  250 mg daily at bedtime Lithium  ER increased to 600 mg daily at bedtime - Renal function labs collected - BUN remains elevated at 29; GFR >60; Creatinine WNL; Lithium  level 0.67 on 12/16 Risperidone  to 1 mg at bedtime Trazodone  100 mg daily at bedtime Cogentin  0.5 mg once daily for potential akathisia (patient describing as anxieties with a consistent feeling/need for movement)    -- The risks/benefits/side-effects/alternatives to this medication were discussed in  detail with the patient and time was given for questions. The patient consents to medication trial.  3. Medical Issues Being Addressed:  No acute concerns. Lactulose  for constipation.  4. Discharge Planning:   -- Social work and case management to assist with discharge planning and identification of hospital follow-up needs prior to discharge  -- Estimated LOS: unable to estimate at this time.  Discussed with attending physician Dr. Bellah Alia, who is in agreement with plan.  Allyn Foil, MD 06/01/2024, 12:54 PM

## 2024-06-02 DIAGNOSIS — F200 Paranoid schizophrenia: Secondary | ICD-10-CM | POA: Diagnosis not present

## 2024-06-02 LAB — CBC WITH DIFFERENTIAL/PLATELET
Abs Immature Granulocytes: 0.03 K/uL (ref 0.00–0.07)
Basophils Absolute: 0 K/uL (ref 0.0–0.1)
Basophils Relative: 0 %
Eosinophils Absolute: 0 K/uL (ref 0.0–0.5)
Eosinophils Relative: 0 %
HCT: 41.6 % (ref 39.0–52.0)
Hemoglobin: 14.2 g/dL (ref 13.0–17.0)
Immature Granulocytes: 1 %
Lymphocytes Relative: 23 %
Lymphs Abs: 1.5 K/uL (ref 0.7–4.0)
MCH: 31.6 pg (ref 26.0–34.0)
MCHC: 34.1 g/dL (ref 30.0–36.0)
MCV: 92.4 fL (ref 80.0–100.0)
Monocytes Absolute: 0.8 K/uL (ref 0.1–1.0)
Monocytes Relative: 12 %
Neutro Abs: 4.2 K/uL (ref 1.7–7.7)
Neutrophils Relative %: 64 %
Platelets: 250 K/uL (ref 150–400)
RBC: 4.5 MIL/uL (ref 4.22–5.81)
RDW: 12.1 % (ref 11.5–15.5)
WBC: 6.6 K/uL (ref 4.0–10.5)
nRBC: 0 % (ref 0.0–0.2)

## 2024-06-02 LAB — GLUCOSE, CAPILLARY: Glucose-Capillary: 201 mg/dL — ABNORMAL HIGH (ref 70–99)

## 2024-06-02 NOTE — Plan of Care (Signed)
   Problem: Education: Goal: Emotional status will improve Outcome: Progressing Goal: Mental status will improve Outcome: Progressing

## 2024-06-02 NOTE — Progress Notes (Signed)
" °   06/01/24 1525  Spiritual Encounters  Type of Visit Follow up  Care provided to: Patient  Referral source Patient request  Reason for visit Routine spiritual support  OnCall Visit No   I provided spiritual care support to Lance [name] following spirituality group. I also saw him briefly just before the group when he expressed need for support and prayer.  Lance processed with me questions he had about faith and theology. He shared struggles with anxiety and fatigue coping with symptoms resulting in thoughts of ending his life at times. This suffering prompted insecurities in his faith outlook and further theological question around suicide.  This chaplain provided compassionate presence and active and reflective listening to understand root of suffering. I reflected  Lance [name]'s earlier comments about the improvement he has experienced in his anxiety and how significant to remember improvement and see that as a sign of hope. I engaged theological concerns but framed them in an understanding that increased coping/decreased suffering (which he is doing) can reduce SI. Invited reflection on what he might need to fill his life and have less time to dwell on what fosters anxious feelings (learning new things, hobby, etc). Offered prayer at his request.  Kana Reimann L. [name] HERO.Div "

## 2024-06-02 NOTE — Group Note (Signed)
 Date:  06/02/2024 Time:  7:13 PM  Group Topic/Focus:  Wellness Toolbox:   The focus of this group is to discuss various aspects of wellness, balancing those aspects and exploring ways to increase the ability to experience wellness.  Patients will create a wellness toolbox for use upon discharge.    Participation Level:  Active  Participation Quality:  Appropriate  Affect:  Appropriate  Cognitive:  Appropriate  Insight: Appropriate  Engagement in Group:  Engaged  Modes of Intervention:  Activity and Socialization  Additional Comments:    Deitra Caron [place] 06/02/2024, 7:13 PM

## 2024-06-02 NOTE — Progress Notes (Signed)
 Pt calm and pleasant during assessment denying SI/HI/AVH. Pt stated he was feeling better today. Pt compliant with medication administration per MD orders. Pt given education, support, and encouragement to be active in his treatment plan. Pt being monitored Q 15 minutes for safety per unit protocol, remains safe on the unit

## 2024-06-02 NOTE — Progress Notes (Signed)
 [facility] MD Progress Note  06/02/2024 11:07 AM Lance [name]  MRN:  [id number]  From admission/initial presentation:  Lance [name] is a 48 y.o. male admitted: Presented to the ED for 05/20/2024 3:15 PM for Patient arrived with IVC papers. Patient was transported to the crisis center by law enforcement after being found lying in front of tobacco store. Upon contact, he reported hearing voices. SABRA He carries the psychiatric diagnoses of Schizophrenia and has a past medical history of  Chart review significant for diabetes mellitus, hx of pulmonary embolus. Patient is admitted to adult psych unit with Q15 min safety monitoring. Multidisciplinary team approach is offered. Medication management; group/milieu therapy is offered.  Subjective:  Chart reviewed, case discussed in multidisciplinary meeting, patient seen during rounds.   12/19:   06/01/24: Patient is noted to be resting in his bed.  He reports doing better today.  He rates his depression as 8 out of 10, 10 being the worst and anxiety is 4 out of 10.  He reports that the current stressor is the group home refusing to take him back after 30 days and the possibility of him going to prison.  He was very focused on going to prison and wanted to know if he will be receiving the mental health treatment in the prison and continue his medication.  He reports chronic suicidal ideations stating he struggled with those for a long time but clarifies that they are passive SI with no intent or plan.  He denies auditory/visual hallucinations.  He reports having fair appetite and sleep.  He is noted to be participating in groups and is calm and cooperative.  12/17: On interview today, patient is found in the hallway, he returns to room to engage in interview with provider.  He reports some improvement in anxiety with addition of morning Klonopin  dose.  Per nursing report, patient has been more visible in the milieu.  Patient reports tolerating current  medication regimen well without adverse concerns, he denies increased sedation.  He rates depression as 8 out of 10 today.  He states anxiety continues to fluctuate throughout the day and at worst is 9 out of 10.  He continues to report passive suicidal ideation without intent or plan.  He is able to contract for safety in the current environment.  He denies HI/plan. He endorses auditory hallucinations and occasional visual hallucinations.  He denies command hallucinations.   12/16: On interview today, patient is noted to be resting in bed.  He reports passive SI without intent or plan.  He is able to contract for safety in the current environment.  He denies HI/plan.  He denies current auditory hallucinations, states last episode of this was a couple days ago where he was hearing hollering, screaming.  He denies current visual hallucinations.  He rates depression as 6-7 out of 10 and anxiety at 6 out of 10 today.  He reports fair sleep and good appetite.  He states anxiety fluctuates throughout the day.  He is tolerating current medication regimen well without adverse effects.  He is agreeable to adding Klonopin  dose in the morning.   December 15 patient is reporting that he is feeling better somewhat anxious no perceptual disturbances 12/14: Patient found laying in room for interview. Patient sleeping prior to interview. Upon waking, patient reports he is still having anxieties, a lot of anxieties. Discussed further and patient was able to express a feeling of having to move, shake his leg, or do something consistently (  identified these as anxieties). Due to medication regimen, patient at higher risk for Akathisia - consistent movement noted when interviewing patient. Discussed Akathisia and restlessness. Discussed treatment options and patient was open to cogentin .    Denies SI, HI, and AVH. Denies other concerns/complaints.    12/13: Patient found laying in room. He reports he is having a lot of  anxieties. When asked further patient is unable to explain anxiety, just reporting it happens when he wakes up and is consistent until he sleeps. He reports he tries to sleep through it.    Denies other concerns complaints. Declines changes to medication regimen at this time. Denies SI, HI, and AVH.    12/12: On interview today, patient is noted to be more awake and alert.  He reports continued improvement in daytime tiredness.  He is noted to be more interactive on the unit.  He is tolerating medication regimen well.  He rates depression as 7 out of 10 and anxiety at 8 out of 10.  He denies SI/HI/plan.  He denies hallucinations at time of interview.  Will consider lithium  dose increase pending renal labs, ordered today.  Reports having bowel movement yesterday.   12/11: On interview today, patient is found resting in bed.  He reports daytime drowsiness is improving.  Will decrease Klonopin  to 1 mg once daily in the evening and Risperdal  1 mg at bedtime due to daytime drowsiness.  He reports good sleep and stable appetite.  He endorses depressive symptoms but denies anxiety at this time.  He denies SI/HI/plan and denies hallucinations at time of interview.    12/10: On interview today, patient is noted to be resting in bed with one-to-one sitter in the room.  Patient is noted to be drowsy, but wakes on verbal commands.  He reports drowsiness during the day.  He reports good appetite.  He denies current SI at this time.  He denies HI/plan.  He endorses occasional auditory hallucinations but denies command hallucinations.  He endorses visual hallucinations of seeing faces.  He rates anxiety as 7 out of 10 and depression as 7-8 out of 10 today.   12/9: Patient is noted to be resting in bed with one-to-one sitter in the room.  Patient is noted to be very drowsy.  He was able to wake up for the verbal commands.  He reports that he is feeling out of it.  He reports having abdominal pain on the left side.   Being oncological patient encouraged to inform the staff if he is constipated.  Patient reports having bowel movements.  Patient denies having any urinary symptoms.  Patient vitals are stable except for low blood pressure.  Patient received increased dose of Klonopin  during this admission 2 mg twice daily.  Patient reports ongoing thoughts of suicide but denies having any active plan.  Patient denies auditory/visual hallucinations.  Discussed the plan to reduce the dosing of Klonopin , remove melatonin and discontinue propranolol  due to low blood pressure.  Past Psychiatric History: see h&P Family History:  Family History  Problem Relation Age of Onset   CAD Mother    Rheum arthritis Mother    Healthy Father    CAD Sister    Obesity Sister    Social History:  Social History   Substance and Sexual Activity  Alcohol Use Not Currently   Comment: occassionally     Social History   Substance and Sexual Activity  Drug Use Yes   Types: Marijuana   Comment: Patient reports  occasionally    Social History   Socioeconomic History   Marital status: Single    Spouse name: Not on file   Number of children: Not on file   Years of education: Not on file   Highest education level: Not on file  Occupational History   Not on file  Tobacco Use   Smoking status: Every Day    Current packs/day: 1.00    Average packs/day: 1 pack/day for 23.0 years (23.0 ttl pk-yrs)    Types: Cigarettes   Smokeless tobacco: Never   Tobacco comments:    will provide material  Vaping Use   Vaping status: Every Day  Substance and Sexual Activity   Alcohol use: Not Currently    Comment: occassionally   Drug use: Yes    Types: Marijuana    Comment: Patient reports occasionally   Sexual activity: Not Currently    Birth control/protection: None    Comment: occasional marijuana- none recently  Other Topics Concern   Not on file  Social History Narrative   From a group home in [place]   Social  Drivers of Health   Tobacco Use: High Risk (05/21/2024)   Patient History    Smoking Tobacco Use: Every Day    Smokeless Tobacco Use: Never    Passive Exposure: Not on file  Financial Resource Strain: Not on file  Food Insecurity: No Food Insecurity (05/21/2024)   Epic    Worried About Programme Researcher, Broadcasting/film/video in the Last Year: Never true    Ran Out of Food in the Last Year: Never true  Transportation Needs: No Transportation Needs (05/21/2024)   Epic    Lack of Transportation (Medical): No    Lack of Transportation (Non-Medical): No  Physical Activity: Not on file  Stress: Not on file  Social Connections: Not on file  Depression (EYV7-0): Not on file  Alcohol Screen: Low Risk (05/21/2024)   Alcohol Screen    Last Alcohol Screening Score (AUDIT): 0  Housing: Low Risk (05/21/2024)   Epic    Unable to Pay for Housing in the Last Year: No    Number of Times Moved in the Last Year: 1    Homeless in the Last Year: No  Utilities: Not At Risk (05/21/2024)   Epic    Threatened with loss of utilities: No  Health Literacy: Not on file   Past Medical History:  Past Medical History:  Diagnosis Date   Depression    Diabetes mellitus without complication (HCC)    Hyperlipidemia    Hypertension    Lupus anticoagulant disorder 06/14/2012   as per Dr.pandit's note in dec 2013.   PE (pulmonary thromboembolism) (HCC)    Schizo affective schizophrenia (HCC)    Supratherapeutic INR 11/19/2016   History reviewed. No pertinent surgical history.  Current Medications: Current Facility-Administered Medications  Medication Dose Route Frequency Provider Last Rate Last Admin   acetaminophen  (TYLENOL ) tablet 650 mg  650 mg Oral Q6H PRN [name], [name], NP   650 mg at 05/22/24 1203   albuterol  (VENTOLIN  HFA) 108 (90 Base) MCG/ACT inhaler 2 puff  2 puff Inhalation Q6H PRN [name], [name], NP       alum & mag hydroxide-simeth (MAALOX/MYLANTA) 200-200-20 MG/5ML suspension 30 mL  30 mL Oral Q4H PRN [name], [name],  NP       apixaban  (ELIQUIS ) tablet 2.5 mg  2.5 mg Oral BID [name], [name], NP   2.5 mg at 06/02/24 0800   benztropine  (COGENTIN )  tablet 0.5 mg  0.5 mg Oral Daily Vong Garringer, NP   0.5 mg at 06/02/24 0801   clonazePAM  (KLONOPIN ) tablet 0.5 mg  0.5 mg Oral Daily [name], [name], PA-C   0.5 mg at 06/02/24 0801   clonazePAM  (KLONOPIN ) tablet 1 mg  1 mg Oral Daily [name], [name], PA-C   1 mg at 06/01/24 1703   cloZAPine  (CLOZARIL ) tablet 250 mg  250 mg Oral QHS [name], [name], NP   250 mg at 06/01/24 2102   haloperidol  (HALDOL ) tablet 5 mg  5 mg Oral TID PRN [name], [name], NP   5 mg at 05/22/24 1424   And   diphenhydrAMINE  (BENADRYL ) capsule 50 mg  50 mg Oral TID PRN [name], [name], NP   50 mg at 05/22/24 1424   haloperidol  lactate (HALDOL ) injection 5 mg  5 mg Intramuscular TID PRN [name], [name], NP   5 mg at 05/21/24 1541   And   diphenhydrAMINE  (BENADRYL ) injection 50 mg  50 mg Intramuscular TID PRN [name], [name], NP       And   LORazepam  (ATIVAN ) injection 2 mg  2 mg Intramuscular TID PRN [name], [name], NP       haloperidol  lactate (HALDOL ) injection 10 mg  10 mg Intramuscular TID PRN [name], [name], NP       And   diphenhydrAMINE  (BENADRYL ) injection 50 mg  50 mg Intramuscular TID PRN [name], [name], NP   50 mg at 05/21/24 1541   And   LORazepam  (ATIVAN ) injection 2 mg  2 mg Intramuscular TID PRN [name], [name], NP   2 mg at 05/21/24 1540   feeding supplement (GLUCERNA SHAKE) (GLUCERNA SHAKE) liquid 237 mL  237 mL Oral TID BM [name], [name], MD   237 mL at 06/02/24 1041   fluticasone  furoate-vilanterol (BREO ELLIPTA ) 200-25 MCG/ACT 1 puff  1 puff Inhalation Daily [name], [name], NP   1 puff at 06/02/24 0805   hydrOXYzine  (ATARAX ) tablet 25 mg  25 mg Oral Q8H PRN [name], [name], MD   25 mg at 06/01/24 2105   lactulose  (CHRONULAC ) 10 GM/15ML solution 20 g  20 g Oral BID PRN [name], [name], MD   20 g at 05/25/24 1533   lisinopril  (ZESTRIL ) tablet 2.5 mg  2.5 mg Oral Daily [name], Annie  B, NP   2.5 mg at 06/02/24 0801   lithium  carbonate (LITHOBID ) ER tablet 600 mg  600 mg Oral QHS [name], [name], MD   600 mg at 06/01/24 2103   metFORMIN  (GLUCOPHAGE ) tablet 1,000 mg  1,000 mg Oral BID WC [name], [name], NP   1,000 mg at 06/02/24 0800   risperiDONE  (RISPERDAL ) tablet 1 mg  1 mg Oral QHS [name], [name], PA-C   1 mg at 06/01/24 2103   simvastatin  (ZOCOR ) tablet 40 mg  40 mg Oral q1800 [name], [name], MD   40 mg at 06/01/24 1703   traZODone  (DESYREL ) tablet 100 mg  100 mg Oral QHS [name], [name], NP   100 mg at 06/01/24 2102    Lab Results:  Results for orders placed or performed during the hospital encounter of 05/21/24 (from the past 48 hours)  Glucose, capillary     Status: Abnormal   Collection Time: 06/02/24  8:14 AM  Result Value Ref Range   Glucose-Capillary 201 (H) 70 - 99 mg/dL    Comment: Glucose reference range applies only to samples taken after fasting for at least  8 hours.    Blood Alcohol level:  Lab Results  Component Value Date   [facility] <15 05/20/2024   ETH <15 05/19/2024    Metabolic Disorder Labs: Lab Results  Component Value Date   HGBA1C 5.7 (H) 05/22/2024   MPG 116.89 05/22/2024   MPG 93.93 09/10/2023   Lab Results  Component Value Date   PROLACTIN 2.0 (L) 08/31/2016   PROLACTIN 4.8 05/22/2016   Lab Results  Component Value Date   CHOL 160 05/22/2024   TRIG 310 (H) 05/22/2024   HDL 36 (L) 05/22/2024   CHOLHDL 4.4 05/22/2024   VLDL 62 (H) 05/22/2024   LDLCALC 62 05/22/2024   LDLCALC 68 05/02/2024    Physical Findings: AIMS:  , ,  ,  ,    CIWA:    COWS:      Psychiatric Specialty Exam:  Presentation  General Appearance:  Disheveled  Eye Contact: Poor  Speech: Clear and Coherent  Speech Volume: Normal    Mood and Affect  Mood: Anxious   Affect: Congruent   Thought Process  Thought Processes: Coherent  Orientation:Full (Time, Place and Person)  Thought Content:WDL  Hallucinations: Auditory,  visual  Ideas of Reference:None  Suicidal Thoughts: Passive, without intent, without plan Homicidal Thoughts: Denies   Sensorium  Memory: Immediate Fair; Recent Fair  Judgment: Fair  Insight: Lacking   Executive Functions  Concentration: Fair  Attention Span: Fair  Recall: [employer] of Knowledge: Fair  Language: Fair   Psychomotor Activity  Psychomotor Activity: Normal Musculoskeletal: Strength & Muscle Tone: within normal limits Gait & Station: normal Assets  Assets: Manufacturing Systems Engineer; Desire for Improvement    Physical Exam: Physical Exam ROS Blood pressure 121/79, pulse (!) 103, temperature 97.7 F (36.5 C), temperature source Oral, resp. rate 18, height 5' 10 (1.778 m), weight 86.3 kg, SpO2 100%. Body mass index is 27.3 kg/m.  Diagnosis: Principal Problem:   Schizophrenia, paranoid (HCC)   PLAN: Safety and Monitoring:  -- Voluntary admission to inpatient psychiatric unit for safety, stabilization and treatment  -- Daily contact with patient to assess and evaluate symptoms and progress in treatment  -- Patient's case to be discussed in multi-disciplinary team meeting  -- Observation Level : q15 minute checks  -- Vital signs:  q12 hours  -- Precautions: suicide, elopement, and assault -- Encouraged patient to participate in unit milieu and in scheduled group therapies  2. Psychiatric Treatment:  Scheduled Medications:  Klonopin  0.5 mg once daily in the morning, 1mg  once daily in the evening -monitor for sedation Clozaril  250 mg daily at bedtime Lithium  ER increased to 600 mg daily at bedtime - Renal function labs collected - BUN remains elevated at 29; GFR >60; Creatinine WNL; Lithium  level 0.67 on 12/16 Risperidone  to 1 mg at bedtime Trazodone  100 mg daily at bedtime Cogentin  0.5 mg once daily for potential akathisia (patient describing as anxieties with a consistent feeling/need for movement)    -- The  risks/benefits/side-effects/alternatives to this medication were discussed in detail with the patient and time was given for questions. The patient consents to medication trial.  3. Medical Issues Being Addressed:  No acute concerns. Lactulose  for constipation.  4. Discharge Planning:   -- Social work and case management to assist with discharge planning and identification of hospital follow-up needs prior to discharge  -- Estimated LOS: unable to estimate at this time.  Discussed with attending physician Dr. [name], who is in agreement with plan.  Brylyn Novakovich, NP 06/02/2024, 11:07 AM

## 2024-06-02 NOTE — [facility] Group Notes (Signed)
 Spirituality Group   Description: Participant directed exploration of values, beliefs and meaning   **Focus on Gratitude: Invite reflection on sources of gratitude (external/internal); goal to invite internal gratitude to foster 1) reconnection with life-giving activities 2) self-compassion.   Following a brief framework of chaplains role and ground rules of group behavior, participants are invited to share concerns or questions that engage spiritual life. Emphasis placed on common themes and shared experiences and ways to make meaning and clarify living into ones values.   Theory/Process/Goal: Utilize the theoretical framework of group therapy established by Celena Kite, Relational Cultural Theory and Rogerian approaches to facilitate relational empathy and use of the here and now to foster reflection, self-awareness, and sharing.   Observations: Lance [name] was an active particpant in the group discussion.  Lance [name] L. [name] HERO.Div

## 2024-06-02 NOTE — Plan of Care (Signed)
" °  Problem: Education: Goal: Emotional status will improve Outcome: Progressing   Problem: Education: Goal: Mental status will improve Outcome: Progressing   Problem: Activity: Goal: Interest or engagement in activities will improve Outcome: Progressing   Problem: Activity: Goal: Sleeping patterns will improve Outcome: Progressing   Problem: Coping: Goal: Ability to verbalize frustrations and anger appropriately will improve Outcome: Progressing   "

## 2024-06-02 NOTE — BH IP Treatment Plan (Signed)
 Interdisciplinary Treatment and Diagnostic Plan Update  06/02/2024 Time of Session: 8:30 Ada Woodbury MRN: [id number]  Principal Diagnosis: Schizophrenia, paranoid (HCC)  Secondary Diagnoses: Principal Problem:   Schizophrenia, paranoid (HCC)   Current Medications:  Current Facility-Administered Medications  Medication Dose Route Frequency Provider Last Rate Last Admin   acetaminophen  (TYLENOL ) tablet 650 mg  650 mg Oral Q6H PRN [name], [name], NP   650 mg at 05/22/24 1203   albuterol  (VENTOLIN  HFA) 108 (90 Base) MCG/ACT inhaler 2 puff  2 puff Inhalation Q6H PRN [name], [name], NP       alum & mag hydroxide-simeth (MAALOX/MYLANTA) 200-200-20 MG/5ML suspension 30 mL  30 mL Oral Q4H PRN [name], [name], NP       apixaban  (ELIQUIS ) tablet 2.5 mg  2.5 mg Oral BID [name], [name], NP   2.5 mg at 06/02/24 0800   benztropine  (COGENTIN ) tablet 0.5 mg  0.5 mg Oral Daily May, Tanya, NP   0.5 mg at 06/02/24 0801   clonazePAM  (KLONOPIN ) tablet 0.5 mg  0.5 mg Oral Daily [name], [name], PA-C   0.5 mg at 06/02/24 0801   clonazePAM  (KLONOPIN ) tablet 1 mg  1 mg Oral Daily [name], [name], PA-C   1 mg at 06/01/24 1703   cloZAPine  (CLOZARIL ) tablet 250 mg  250 mg Oral QHS [name], [name], NP   250 mg at 06/01/24 2102   haloperidol  (HALDOL ) tablet 5 mg  5 mg Oral TID PRN [name], [name], NP   5 mg at 05/22/24 1424   And   diphenhydrAMINE  (BENADRYL ) capsule 50 mg  50 mg Oral TID PRN [name], [name], NP   50 mg at 05/22/24 1424   haloperidol  lactate (HALDOL ) injection 5 mg  5 mg Intramuscular TID PRN [name], [name], NP   5 mg at 05/21/24 1541   And   diphenhydrAMINE  (BENADRYL ) injection 50 mg  50 mg Intramuscular TID PRN [name], [name], NP       And   LORazepam  (ATIVAN ) injection 2 mg  2 mg Intramuscular TID PRN [name], [name], NP       haloperidol  lactate (HALDOL ) injection 10 mg  10 mg Intramuscular TID PRN [name], [name], NP       And   diphenhydrAMINE  (BENADRYL ) injection 50 mg  50 mg  Intramuscular TID PRN [name], [name], NP   50 mg at 05/21/24 1541   And   LORazepam  (ATIVAN ) injection 2 mg  2 mg Intramuscular TID PRN [name], [name], NP   2 mg at 05/21/24 1540   feeding supplement (GLUCERNA SHAKE) (GLUCERNA SHAKE) liquid 237 mL  237 mL Oral TID BM [name], [name], MD   237 mL at 06/01/24 2100   fluticasone  furoate-vilanterol (BREO ELLIPTA ) 200-25 MCG/ACT 1 puff  1 puff Inhalation Daily [name], [name], NP   1 puff at 06/02/24 0805   hydrOXYzine  (ATARAX ) tablet 25 mg  25 mg Oral Q8H PRN [name], [name], MD   25 mg at 06/01/24 2105   lactulose  (CHRONULAC ) 10 GM/15ML solution 20 g  20 g Oral BID PRN [name], [name], MD   20 g at 05/25/24 1533   lisinopril  (ZESTRIL ) tablet 2.5 mg  2.5 mg Oral Daily [name], [name], NP   2.5 mg at 06/02/24 0801   lithium  carbonate (LITHOBID ) ER tablet 600 mg  600 mg Oral QHS [name], [name], MD   600 mg at 06/01/24 2103   metFORMIN  (GLUCOPHAGE ) tablet 1,000 mg  1,000 mg  Oral BID WC [name], [name], NP   1,000 mg at 06/02/24 0800   risperiDONE  (RISPERDAL ) tablet 1 mg  1 mg Oral QHS [name], [name], PA-C   1 mg at 06/01/24 2103   simvastatin  (ZOCOR ) tablet 40 mg  40 mg Oral q1800 [name], [name], MD   40 mg at 06/01/24 1703   traZODone  (DESYREL ) tablet 100 mg  100 mg Oral QHS [name], [name], NP   100 mg at 06/01/24 2102   PTA Medications: Medications Prior to Admission  Medication Sig Dispense Refill Last Dose/Taking   albuterol  (VENTOLIN  HFA) 108 (90 Base) MCG/ACT inhaler Inhale 2 puffs into the lungs every 6 (six) hours as needed for wheezing or shortness of breath.      clonazePAM  (KLONOPIN ) 2 MG tablet Take 1 tablet (2 mg total) by mouth 2 (two) times daily for 15 days. 30 tablet 0    cloZAPine  (CLOZARIL ) 50 MG tablet Take 5 tablets (250 mg total) by mouth at bedtime. 150 tablet 0    ELIQUIS  2.5 MG TABS tablet Take 2.5 mg by mouth 2 (two) times daily.      fluticasone -salmeterol (ADVAIR) 250-50 MCG/ACT AEPB Inhale 1 puff into the lungs 2 (two)  times daily. 60 each 1    hydrOXYzine  (ATARAX ) 25 MG tablet Take 1 tablet (25 mg total) by mouth daily as needed for anxiety. 30 tablet 0    lisinopril  (ZESTRIL ) 2.5 MG tablet Take 1 tablet (2.5 mg total) by mouth daily. 30 tablet 1    lithium  carbonate (ESKALITH ) 450 MG ER tablet Take 1 tablet (450 mg total) by mouth at bedtime. 30 tablet 1    melatonin 5 MG TABS Take 5 mg by mouth at bedtime.      metFORMIN  (GLUCOPHAGE ) 1000 MG tablet Take 1 tablet (1,000 mg total) by mouth 2 (two) times daily with a meal. 60 tablet 1    propranolol  (INDERAL ) 20 MG tablet Take 1 tablet (20 mg total) by mouth at bedtime. 30 tablet 1    risperiDONE  (RISPERDAL ) 1 MG tablet Take 1 mg by mouth 2 (two) times daily.      simvastatin  (ZOCOR ) 40 MG tablet Take 1 tablet (40 mg total) by mouth daily at 6 PM. 30 tablet 1    traZODone  (DESYREL ) 100 MG tablet Take 1 tablet (100 mg total) by mouth at bedtime. 30 tablet 1     Patient Stressors:    Patient Strengths:    Treatment Modalities: Medication Management, Group therapy, Case management,  1 to 1 session with clinician, Psychoeducation, Recreational therapy.   Physician Treatment Plan for Primary Diagnosis: Schizophrenia, paranoid (HCC) Long Term Goal(s): Improvement in symptoms so as ready for discharge   Short Term Goals: Ability to identify changes in lifestyle to reduce recurrence of condition will improve Ability to demonstrate self-control will improve Ability to identify and develop effective coping behaviors will improve Compliance with prescribed medications will improve Ability to verbalize feelings will improve  Medication Management: Evaluate patient's response, side effects, and tolerance of medication regimen.  Therapeutic Interventions: 1 to 1 sessions, Unit Group sessions and Medication administration.  Evaluation of Outcomes: Progressing  Physician Treatment Plan for Secondary Diagnosis: Principal Problem:   Schizophrenia, paranoid  (HCC)  Long Term Goal(s): Improvement in symptoms so as ready for discharge   Short Term Goals: Ability to identify changes in lifestyle to reduce recurrence of condition will improve Ability to demonstrate self-control will improve Ability to identify and develop effective coping behaviors will  improve Compliance with prescribed medications will improve Ability to verbalize feelings will improve     Medication Management: Evaluate patient's response, side effects, and tolerance of medication regimen.  Therapeutic Interventions: 1 to 1 sessions, Unit Group sessions and Medication administration.  Evaluation of Outcomes: Progressing   RN Treatment Plan for Primary Diagnosis: Schizophrenia, paranoid (HCC) Long Term Goal(s): Knowledge of disease and therapeutic regimen to maintain health will improve  Short Term Goals: Ability to remain free from injury will improve, Ability to verbalize frustration and anger appropriately will improve, Ability to demonstrate self-control, Ability to participate in decision making will improve, Ability to verbalize feelings will improve, Ability to disclose and discuss suicidal ideas, Ability to identify and develop effective coping behaviors will improve, and Compliance with prescribed medications will improve  Medication Management: RN will administer medications as ordered by provider, will assess and evaluate patient's response and provide education to patient for prescribed medication. RN will report any adverse and/or side effects to prescribing provider.  Therapeutic Interventions: 1 on 1 counseling sessions, Psychoeducation, Medication administration, Evaluate responses to treatment, Monitor vital signs and CBGs as ordered, Perform/monitor CIWA, COWS, AIMS and Fall Risk screenings as ordered, Perform wound care treatments as ordered.  Evaluation of Outcomes: Progressing   LCSW Treatment Plan for Primary Diagnosis: Schizophrenia, paranoid (HCC) Long  Term Goal(s): Safe transition to appropriate next level of care at discharge, Engage patient in therapeutic group addressing interpersonal concerns.  Short Term Goals: Engage patient in aftercare planning with referrals and resources, Increase social support, Increase ability to appropriately verbalize feelings, Increase emotional regulation, Facilitate acceptance of mental health diagnosis and concerns, Identify triggers associated with mental health/substance abuse issues, and Increase skills for wellness and recovery  Therapeutic Interventions: Assess for all discharge needs, 1 to 1 time with Social worker, Explore available resources and support systems, Assess for adequacy in community support network, Educate family and significant other(s) on suicide prevention, Complete Psychosocial Assessment, Interpersonal group therapy.  Evaluation of Outcomes: Progressing   Progress in Treatment: Attending groups: Yes. Participating in groups: Yes. Taking medication as prescribed: Yes. Toleration medication: Yes. Family/Significant other contact made: Yes, individual(s) contacted:  legal guardian, [name].  Patient understands diagnosis: Yes. Discussing patient identified problems/goals with staff: Yes. Medical problems stabilized or resolved: Yes. Denies suicidal/homicidal ideation: Yes. Issues/concerns per patient self-inventory: No. Other: none.  New problem(s) identified: No, Describe:  none identified. Update 06/01/24: No changes at this time.    New Short Term/Long Term Goal(s): elimination of symptoms of psychosis, medication management for mood stabilization; elimination of SI thoughts; development of comprehensive mental wellness plan. Update 06/01/24: No changes at this time.      Patient Goals:  Pt declined to participate in treatment team meeting despite personal invitation by staff. Update 06/01/24: No changes at this time.    Discharge Plan or Barriers: CSW will assist pt  with development of an appropriate aftercare/discharge plan. Update 06/01/24: No changes at this time.      Reason for Continuation of Hospitalization: Hallucinations Medication stabilization Suicidal ideation   Estimated Length of Stay:1-7 days Update 06/01/24: TBD   Last 3 [place] Suicide Severity Risk Score: Flowsheet Row Admission (Current) from 05/21/2024 in [facility] ED from 05/20/2024 in [facility] Emergency Department at [facility] ED from 05/19/2024 in [facility] Emergency Department at [facility]  C-SSRS RISK CATEGORY High Risk No Risk High Risk    Last PHQ 2/9 Scores:    03/20/2021   11:14 AM  03/20/2021   11:04 AM  Depression screen PHQ 2/9  Decreased Interest 0 0  Down, Depressed, Hopeless 0 0  PHQ - 2 Score 0 0    Scribe for Treatment Team: Nadara JONELLE Fam, KEN 06/02/2024 9:39 AM

## 2024-06-02 NOTE — Group Note (Signed)
 Date:  06/02/2024 Time:  8:55 PM  Group Topic/Focus:  Coping With Mental Health Crisis:   The purpose of this group is to help patients identify strategies for coping with mental health crisis.  Group discusses possible causes of crisis and ways to manage them effectively. Developing a Wellness Toolbox:   The focus of this group is to help patients develop a wellness toolbox with skills and strategies to promote recovery upon discharge.    Participation Level:  Active  Participation Quality:  Appropriate  Affect:  Appropriate  Cognitive:  Appropriate  Insight: Appropriate  Engagement in Group:  Engaged  Modes of Intervention:  Discussion  Additional Comments:    Lance [name] L 06/02/2024, 8:55 PM

## 2024-06-02 NOTE — Progress Notes (Signed)
" °   06/01/24 2200  Psychosocial Assessment  Patient Complaints Sadness  Eye Contact Brief  Facial Expression Sad  Affect Depressed;Anxious  Speech Soft  Interaction Guarded;Minimal  Motor Activity Slow  Appearance/Hygiene In scrubs  Behavior Characteristics Cooperative;Appropriate to situation  Mood Depressed;Anxious  Aggressive Behavior  Effect No apparent injury  Thought Process  Coherency WDL  Content WDL  Delusions None reported or observed  Perception WDL  Hallucination None reported or observed  Judgment Poor  Confusion None  Danger to Self  Current suicidal ideation? Denies  Self-Injurious Behavior No self-injurious ideation or behavior indicators observed or expressed   Agreement Not to Harm Self Yes  Description of Agreement verbal  Danger to Others  Danger to Others None reported or observed    "

## 2024-06-02 NOTE — Group Note (Signed)
 Recreation Therapy Group Note   Group Topic:Leisure Education  Group Date: 06/02/2024 Start Time: 1530 End Time: 1620 Facilitators: Celestia Jeoffrey FORBES ARTICE, CTRS Location: Craft Room  Group Description: Leisure. Patients were given the option to choose from journaling, coloring, drawing, making origami, playing with playdoh, listening to music or singing karaoke. LRT and pts discussed the meaning of leisure, the importance of participating in leisure during their free time/when they're outside of the hospital, as well as how our leisure interests can also serve as coping skills.   Goal Area(s) Addressed:  Patient will identify a current leisure interest.  Patient will learn the definition of leisure. Patient will practice making a positive decision. Patient will have the opportunity to try a new leisure activity. Patient will communicate with peers and LRT.   Affect/Mood: Appropriate and Flat   Participation Level: Active and Engaged   Participation Quality: Independent   Behavior: Calm and Cooperative   Speech/Thought Process: Coherent   Insight: Fair   Judgement: Fair    Modes of Intervention: Clarification, Education, Exploration, and Music   Patient Response to Interventions:  Attentive, Engaged, and Receptive   Education Outcome:  Acknowledges education   Clinical Observations/Individualized Feedback: Lance [name] was active in their participation of session activities and group discussion. Pt identified listen to music and walk as things he does in his free time.    Plan: Continue to engage patient in RT group sessions 2-3x/week.   Jeoffrey FORBES Celestia, LRT, CTRS 06/02/2024 5:03 PM

## 2024-06-02 NOTE — Group Note (Signed)
 Date:  06/02/2024 Time:  10:36 AM  Group Topic/Focus:  Goals Group:   The focus of this group is to help patients establish daily goals to achieve during treatment and discuss how the patient can incorporate goal setting into their daily lives to aide in recovery.    Participation Level:  Active  Participation Quality:  Appropriate  Affect:  Appropriate  Cognitive:  Appropriate  Insight: Appropriate  Engagement in Group:  Engaged  Modes of Intervention:  Discussion, Education, and Support  Additional Comments:    Deitra Caron Mainland 06/02/2024, 10:36 AM

## 2024-06-03 DIAGNOSIS — F200 Paranoid schizophrenia: Secondary | ICD-10-CM | POA: Diagnosis not present

## 2024-06-03 LAB — GLUCOSE, CAPILLARY: Glucose-Capillary: 148 mg/dL — ABNORMAL HIGH (ref 70–99)

## 2024-06-03 MED ORDER — NICOTINE POLACRILEX 2 MG MT GUM
2.0000 mg | CHEWING_GUM | OROMUCOSAL | Status: DC | PRN
  Administered 2024-06-04: 2 mg via ORAL
  Filled 2024-06-03: qty 1

## 2024-06-03 NOTE — Progress Notes (Signed)
 [facility] MD Progress Note  06/03/2024 6:33 PM Lance [name]  MRN:  [id number]  From admission/initial presentation:  Lance [name] is a 48 y.o. male admitted: Presented to the ED for 05/20/2024 3:15 PM for Patient arrived with IVC papers. Patient was transported to the crisis center by law enforcement after being found lying in front of tobacco store. Upon contact, he reported hearing voices. SABRA He carries the psychiatric diagnoses of Schizophrenia and has a past medical history of  Chart review significant for diabetes mellitus, hx of pulmonary embolus. Patient is admitted to adult psych unit with Q15 min safety monitoring. Multidisciplinary team approach is offered. Medication management; group/milieu therapy is offered.  Subjective:  Chart reviewed, case discussed in multidisciplinary meeting, patient seen during rounds.   12/20: Patient is present in milieu and dayroom throughout day. He engages with clinical research associate and reports ongoing anxiety. Discussed the difference between normal anxiety and disorder/disruptive anxiety. Patient expectations appear to be having absolutely no anxiety/worrying. Discussed making realistic goals. Patient endorsed that he is hoping he knows more about where he would be going when discharged, worrying about going to prison for violating probation. He denies SI, HI, and AVH.   12/19: Patient present in hallways and milieu. He expresses he is doing better but he is still having some anxiety. He expresses wanting to give the medication more time. He expresses concern about discharge plans and if he will go to prison (unclear if this is causing lingering anxiety). He expresses concern if he goes to prison if he can get his medication or not. He denies SI, HI, and AVH.   06/01/24: Patient is noted to be resting in his bed.  He reports doing better today.  He rates his depression as 8 out of 10, 10 being the worst and anxiety is 4 out of 10.  He reports that the current  stressor is the group home refusing to take him back after 30 days and the possibility of him going to prison.  He was very focused on going to prison and wanted to know if he will be receiving the mental health treatment in the prison and continue his medication.  He reports chronic suicidal ideations stating he struggled with those for a long time but clarifies that they are passive SI with no intent or plan.  He denies auditory/visual hallucinations.  He reports having fair appetite and sleep.  He is noted to be participating in groups and is calm and cooperative.  12/17: On interview today, patient is found in the hallway, he returns to room to engage in interview with provider.  He reports some improvement in anxiety with addition of morning Klonopin  dose.  Per nursing report, patient has been more visible in the milieu.  Patient reports tolerating current medication regimen well without adverse concerns, he denies increased sedation.  He rates depression as 8 out of 10 today.  He states anxiety continues to fluctuate throughout the day and at worst is 9 out of 10.  He continues to report passive suicidal ideation without intent or plan.  He is able to contract for safety in the current environment.  He denies HI/plan. He endorses auditory hallucinations and occasional visual hallucinations.  He denies command hallucinations.   12/16: On interview today, patient is noted to be resting in bed.  He reports passive SI without intent or plan.  He is able to contract for safety in the current environment.  He denies HI/plan.  He denies current  auditory hallucinations, states last episode of this was a couple days ago where he was hearing hollering, screaming.  He denies current visual hallucinations.  He rates depression as 6-7 out of 10 and anxiety at 6 out of 10 today.  He reports fair sleep and good appetite.  He states anxiety fluctuates throughout the day.  He is tolerating current medication regimen well  without adverse effects.  He is agreeable to adding Klonopin  dose in the morning.   12/15: patient is reporting that he is feeling better somewhat anxious no perceptual disturbances  12/14: Patient found laying in room for interview. Patient sleeping prior to interview. Upon waking, patient reports he is still having anxieties, a lot of anxieties. Discussed further and patient was able to express a feeling of having to move, shake his leg, or do something consistently (identified these as anxieties). Due to medication regimen, patient at higher risk for Akathisia - consistent movement noted when interviewing patient. Discussed Akathisia and restlessness. Discussed treatment options and patient was open to cogentin .    Denies SI, HI, and AVH. Denies other concerns/complaints.    12/13: Patient found laying in room. He reports he is having a lot of anxieties. When asked further patient is unable to explain anxiety, just reporting it happens when he wakes up and is consistent until he sleeps. He reports he tries to sleep through it.    Denies other concerns complaints. Declines changes to medication regimen at this time. Denies SI, HI, and AVH.    12/12: On interview today, patient is noted to be more awake and alert.  He reports continued improvement in daytime tiredness.  He is noted to be more interactive on the unit.  He is tolerating medication regimen well.  He rates depression as 7 out of 10 and anxiety at 8 out of 10.  He denies SI/HI/plan.  He denies hallucinations at time of interview.  Will consider lithium  dose increase pending renal labs, ordered today.  Reports having bowel movement yesterday.   12/11: On interview today, patient is found resting in bed.  He reports daytime drowsiness is improving.  Will decrease Klonopin  to 1 mg once daily in the evening and Risperdal  1 mg at bedtime due to daytime drowsiness.  He reports good sleep and stable appetite.  He endorses depressive symptoms  but denies anxiety at this time.  He denies SI/HI/plan and denies hallucinations at time of interview.    12/10: On interview today, patient is noted to be resting in bed with one-to-one sitter in the room.  Patient is noted to be drowsy, but wakes on verbal commands.  He reports drowsiness during the day.  He reports good appetite.  He denies current SI at this time.  He denies HI/plan.  He endorses occasional auditory hallucinations but denies command hallucinations.  He endorses visual hallucinations of seeing faces.  He rates anxiety as 7 out of 10 and depression as 7-8 out of 10 today.   12/9: Patient is noted to be resting in bed with one-to-one sitter in the room.  Patient is noted to be very drowsy.  He was able to wake up for the verbal commands.  He reports that he is feeling out of it.  He reports having abdominal pain on the left side.  Being oncological patient encouraged to inform the staff if he is constipated.  Patient reports having bowel movements.  Patient denies having any urinary symptoms.  Patient vitals are stable except for low blood  pressure.  Patient received increased dose of Klonopin  during this admission 2 mg twice daily.  Patient reports ongoing thoughts of suicide but denies having any active plan.  Patient denies auditory/visual hallucinations.  Discussed the plan to reduce the dosing of Klonopin , remove melatonin and discontinue propranolol  due to low blood pressure.  Past Psychiatric History: see h&P Family History:  Family History  Problem Relation Age of Onset   CAD Mother    Rheum arthritis Mother    Healthy Father    CAD Sister    Obesity Sister    Social History:  Social History   Substance and Sexual Activity  Alcohol Use Not Currently   Comment: occassionally     Social History   Substance and Sexual Activity  Drug Use Yes   Types: Marijuana   Comment: Patient reports occasionally    Social History   Socioeconomic History   Marital status:  Single    Spouse name: Not on file   Number of children: Not on file   Years of education: Not on file   Highest education level: Not on file  Occupational History   Not on file  Tobacco Use   Smoking status: Every Day    Current packs/day: 1.00    Average packs/day: 1 pack/day for 23.0 years (23.0 ttl pk-yrs)    Types: Cigarettes   Smokeless tobacco: Never   Tobacco comments:    will provide material  Vaping Use   Vaping status: Every Day  Substance and Sexual Activity   Alcohol use: Not Currently    Comment: occassionally   Drug use: Yes    Types: Marijuana    Comment: Patient reports occasionally   Sexual activity: Not Currently    Birth control/protection: None    Comment: occasional marijuana- none recently  Other Topics Concern   Not on file  Social History Narrative   From a group home in [place]   Social Drivers of Health   Tobacco Use: High Risk (05/21/2024)   Patient History    Smoking Tobacco Use: Every Day    Smokeless Tobacco Use: Never    Passive Exposure: Not on file  Financial Resource Strain: Not on file  Food Insecurity: No Food Insecurity (05/21/2024)   Epic    Worried About Programme Researcher, Broadcasting/film/video in the Last Year: Never true    Ran Out of Food in the Last Year: Never true  Transportation Needs: No Transportation Needs (05/21/2024)   Epic    Lack of Transportation (Medical): No    Lack of Transportation (Non-Medical): No  Physical Activity: Not on file  Stress: Not on file  Social Connections: Not on file  Depression (EYV7-0): Not on file  Alcohol Screen: Low Risk (05/21/2024)   Alcohol Screen    Last Alcohol Screening Score (AUDIT): 0  Housing: Low Risk (05/21/2024)   Epic    Unable to Pay for Housing in the Last Year: No    Number of Times Moved in the Last Year: 1    Homeless in the Last Year: No  Utilities: Not At Risk (05/21/2024)   Epic    Threatened with loss of utilities: No  Health Literacy: Not on file   Past Medical History:   Past Medical History:  Diagnosis Date   Depression    Diabetes mellitus without complication (HCC)    Hyperlipidemia    Hypertension    Lupus anticoagulant disorder 06/14/2012   as per Dr.pandit's note in dec 2013.   PE (  pulmonary thromboembolism) (HCC)    Schizo affective schizophrenia (HCC)    Supratherapeutic INR 11/19/2016   History reviewed. No pertinent surgical history.  Current Medications: Current Facility-Administered Medications  Medication Dose Route Frequency Provider Last Rate Last Admin   acetaminophen  (TYLENOL ) tablet 650 mg  650 mg Oral Q6H PRN [name], [name], NP   650 mg at 05/22/24 1203   albuterol  (VENTOLIN  HFA) 108 (90 Base) MCG/ACT inhaler 2 puff  2 puff Inhalation Q6H PRN [name], [name], NP       alum & mag hydroxide-simeth (MAALOX/MYLANTA) 200-200-20 MG/5ML suspension 30 mL  30 mL Oral Q4H PRN [name], [name], NP   30 mL at 06/03/24 1747   apixaban  (ELIQUIS ) tablet 2.5 mg  2.5 mg Oral BID [name], [name], NP   2.5 mg at 06/03/24 1710   benztropine  (COGENTIN ) tablet 0.5 mg  0.5 mg Oral Daily Rayman Petrosian, NP   0.5 mg at 06/03/24 9175   clonazePAM  (KLONOPIN ) tablet 0.5 mg  0.5 mg Oral Daily [name], [name], PA-C   0.5 mg at 06/03/24 9176   clonazePAM  (KLONOPIN ) tablet 1 mg  1 mg Oral Daily [name], [name], PA-C   1 mg at 06/03/24 1710   cloZAPine  (CLOZARIL ) tablet 250 mg  250 mg Oral QHS [name], [name], NP   250 mg at 06/02/24 2122   haloperidol  (HALDOL ) tablet 5 mg  5 mg Oral TID PRN [name], [name], NP   5 mg at 05/22/24 1424   And   diphenhydrAMINE  (BENADRYL ) capsule 50 mg  50 mg Oral TID PRN [name], [name], NP   50 mg at 05/22/24 1424   haloperidol  lactate (HALDOL ) injection 5 mg  5 mg Intramuscular TID PRN [name], [name], NP   5 mg at 05/21/24 1541   And   diphenhydrAMINE  (BENADRYL ) injection 50 mg  50 mg Intramuscular TID PRN [name], [name], NP       And   LORazepam  (ATIVAN ) injection 2 mg  2 mg Intramuscular TID PRN [name], [name], NP       haloperidol   lactate (HALDOL ) injection 10 mg  10 mg Intramuscular TID PRN [name], [name], NP       And   diphenhydrAMINE  (BENADRYL ) injection 50 mg  50 mg Intramuscular TID PRN [name], [name], NP   50 mg at 05/21/24 1541   And   LORazepam  (ATIVAN ) injection 2 mg  2 mg Intramuscular TID PRN [name], [name], NP   2 mg at 05/21/24 1540   feeding supplement (GLUCERNA SHAKE) (GLUCERNA SHAKE) liquid 237 mL  237 mL Oral TID BM [name], [name], MD   237 mL at 06/03/24 1503   fluticasone  furoate-vilanterol (BREO ELLIPTA ) 200-25 MCG/ACT 1 puff  1 puff Inhalation Daily [name], [name], NP   1 puff at 06/02/24 0805   hydrOXYzine  (ATARAX ) tablet 25 mg  25 mg Oral Q8H PRN [name], [name], MD   25 mg at 06/01/24 2105   lactulose  (CHRONULAC ) 10 GM/15ML solution 20 g  20 g Oral BID PRN [name], [name], MD   20 g at 05/25/24 1533   lisinopril  (ZESTRIL ) tablet 2.5 mg  2.5 mg Oral Daily [name], [name], NP   2.5 mg at 06/03/24 9175   lithium  carbonate (LITHOBID ) ER tablet 600 mg  600 mg Oral QHS [name], [name], MD   600 mg at 06/02/24 2123   metFORMIN  (GLUCOPHAGE ) tablet 1,000 mg  1,000 mg Oral BID WC [name], [name], NP  1,000 mg at 06/03/24 1710   nicotine  polacrilex (NICORETTE ) gum 2 mg  2 mg Oral PRN Siyana Erney, NP       risperiDONE  (RISPERDAL ) tablet 1 mg  1 mg Oral QHS [name], [name], PA-C   1 mg at 06/02/24 2123   simvastatin  (ZOCOR ) tablet 40 mg  40 mg Oral q1800 [name], [name], MD   40 mg at 06/03/24 1710   traZODone  (DESYREL ) tablet 100 mg  100 mg Oral QHS [name], [name], NP   100 mg at 06/02/24 2123    Lab Results:  Results for orders placed or performed during the hospital encounter of 05/21/24 (from the past 48 hours)  Glucose, capillary     Status: Abnormal   Collection Time: 06/02/24  8:14 AM  Result Value Ref Range   Glucose-Capillary 201 (H) 70 - 99 mg/dL    Comment: Glucose reference range applies only to samples taken after fasting for at least 8 hours.  CBC with Differential/Platelet     Status:  None   Collection Time: 06/02/24  4:12 PM  Result Value Ref Range   WBC 6.6 4.0 - 10.5 K/uL   RBC 4.50 4.22 - 5.81 MIL/uL   Hemoglobin 14.2 13.0 - 17.0 g/dL   HCT 58.3 60.9 - 47.9 %   MCV 92.4 80.0 - 100.0 fL   MCH 31.6 26.0 - 34.0 pg   MCHC 34.1 30.0 - 36.0 g/dL   RDW 87.8 88.4 - 84.4 %   Platelets 250 150 - 400 K/uL   nRBC 0.0 0.0 - 0.2 %   Neutrophils Relative % 64 %   Neutro Abs 4.2 1.7 - 7.7 K/uL   Lymphocytes Relative 23 %   Lymphs Abs 1.5 0.7 - 4.0 K/uL   Monocytes Relative 12 %   Monocytes Absolute 0.8 0.1 - 1.0 K/uL   Eosinophils Relative 0 %   Eosinophils Absolute 0.0 0.0 - 0.5 K/uL   Basophils Relative 0 %   Basophils Absolute 0.0 0.0 - 0.1 K/uL   Immature Granulocytes 1 %   Abs Immature Granulocytes 0.03 0.00 - 0.07 K/uL    Comment: Performed at [facility], [address] [place], [place] [address]  Glucose, capillary     Status: Abnormal   Collection Time: 06/03/24  7:42 AM  Result Value Ref Range   Glucose-Capillary 148 (H) 70 - 99 mg/dL    Comment: Glucose reference range applies only to samples taken after fasting for at least 8 hours.    Blood Alcohol level:  Lab Results  Component Value Date   [facility] <15 05/20/2024   ETH <15 05/19/2024    Metabolic Disorder Labs: Lab Results  Component Value Date   HGBA1C 5.7 (H) 05/22/2024   MPG 116.89 05/22/2024   MPG 93.93 09/10/2023   Lab Results  Component Value Date   PROLACTIN 2.0 (L) 08/31/2016   PROLACTIN 4.8 05/22/2016   Lab Results  Component Value Date   CHOL 160 05/22/2024   TRIG 310 (H) 05/22/2024   HDL 36 (L) 05/22/2024   CHOLHDL 4.4 05/22/2024   VLDL 62 (H) 05/22/2024   LDLCALC 62 05/22/2024   LDLCALC 68 05/02/2024    Physical Findings: AIMS:  , ,  ,  ,    CIWA:    COWS:      Psychiatric Specialty Exam:  Presentation  General Appearance:  Appropriate for Environment  Eye Contact: Fair  Speech: Clear and Coherent; Normal Rate  Speech Volume: Normal  Mood  and Affect  Mood: Anxious   Affect: Congruent   Thought Process  Thought Processes: Coherent; Linear  Orientation:Full (Time, Place and Person)  Thought Content:Logical  Hallucinations: Denies Ideas of Reference:None  Suicidal Thoughts: Denies Homicidal Thoughts: Denies   Sensorium  Memory: Immediate Fair; Recent Fair  Judgment: Fair  Insight: Fair   Art Therapist  Concentration: Fair  Attention Span: Fair  Recall: [employer] of Knowledge: Fair  Language: Fair   Psychomotor Activity  Psychomotor Activity: Normal Musculoskeletal: Strength & Muscle Tone: within normal limits Gait & Station: normal Assets  Assets: Manufacturing Systems Engineer; Desire for Improvement    Physical Exam: Physical Exam Vitals and nursing note reviewed.  Constitutional:      Appearance: Normal appearance.  Pulmonary:     Effort: Pulmonary effort is normal.  Neurological:     Mental Status: He is alert and oriented to person, place, and time.  Psychiatric:        Mood and Affect: Mood normal.        Behavior: Behavior normal.    Review of Systems  Respiratory:  Negative for shortness of breath.   Cardiovascular:  Negative for chest pain.  Gastrointestinal:  Negative for diarrhea, nausea and vomiting.  Psychiatric/Behavioral:  Negative for depression, hallucinations and suicidal ideas. The patient is nervous/anxious.   All other systems reviewed and are negative.  Blood pressure 128/81, pulse 95, temperature 97.9 F (36.6 C), resp. rate 20, height 5' 10 (1.778 m), weight 86.3 kg, SpO2 99%. Body mass index is 27.3 kg/m.  Diagnosis: Principal Problem:   Schizophrenia, paranoid (HCC)   PLAN: Safety and Monitoring:  -- Involuntary admission to inpatient psychiatric unit for safety, stabilization and treatment  -- Daily contact with patient to assess and evaluate symptoms and progress in treatment  -- Patient's case to be discussed in multi-disciplinary  team meeting  -- Observation Level : q15 minute checks  -- Vital signs:  q12 hours  -- Precautions: suicide, elopement, and assault -- Encouraged patient to participate in unit milieu and in scheduled group therapies  2. Psychiatric Treatment:  Scheduled Medications:  Klonopin  0.5 mg once daily in the morning, 1mg  once daily in the evening -monitor for sedation Clozaril  250 mg daily at bedtime Lithium  ER increased to 600 mg daily at bedtime on 12/18 (repeat lithium  lab level Monday AM)  Risperidone  1 mg at bedtime Trazodone  100 mg daily at bedtime Cogentin  0.5 mg once daily for potential akathisia (patient previously describing anxieties as a consistent feeling/need for movement)   -- The risks/benefits/side-effects/alternatives to this medication were discussed in detail with the patient and time was given for questions. The patient consents to medication trial.   3. Medical Issues Being Addressed:  No acute concerns. Lactulose  for constipation.  4. Discharge Planning:   -- Social work and case management to assist with discharge planning and identification of hospital follow-up needs prior to discharge  -- Estimated LOS: unable to estimate at this time.    Lenzi Marmo, NP 06/03/2024, 6:33 PM

## 2024-06-03 NOTE — Progress Notes (Signed)
" °   06/03/24 0907  Psych Admission Type (Psych Patients Only)  Admission Status Involuntary  Psychosocial Assessment  Patient Complaints Anxiety  Eye Contact Brief  Facial Expression Sad;Anxious  Affect Anxious;Depressed  Speech Soft  Interaction Assertive  Motor Activity Fidgety  Appearance/Hygiene In scrubs  Behavior Characteristics Cooperative;Anxious  Mood Depressed  Aggressive Behavior  Effect No apparent injury  Thought Process  Coherency WDL  Content WDL  Delusions None reported or observed  Perception WDL  Hallucination None reported or observed  Judgment WDL  Confusion WDL  Danger to Self  Current suicidal ideation? Denies  Self-Injurious Behavior No self-injurious ideation or behavior indicators observed or expressed   Agreement Not to Harm Self Yes  Description of Agreement verbal  Danger to Others  Danger to Others None reported or observed    "

## 2024-06-03 NOTE — Group Note (Signed)
"                                                 [facility] LCSW Group Therapy Note    Group Date: 06/03/2024 Start Time: 1520 End Time: 1600  Type of Therapy and Topic:  Group Therapy:  Overcoming Obstacles  Participation Level:  [facility] PARTICIPATION LEVEL: Active  Mood: calm, pleasant  Description of Group:   In this group patients will be encouraged to explore what they see as obstacles to their own wellness and recovery. They will be guided to discuss their thoughts, feelings, and behaviors related to these obstacles. The group will process together ways to cope with barriers, with attention given to specific choices patients can make. Each patient will be challenged to identify changes they are motivated to make in order to overcome their obstacles. This group will be process-oriented, with patients participating in exploration of their own experiences as well as giving and receiving support and challenge from other group members.  Therapeutic Goals: 1. Patient will identify personal and current obstacles as they relate to admission. 2. Patient will identify barriers that currently interfere with their wellness or overcoming obstacles.  3. Patient will identify feelings, thought process and behaviors related to these barriers. 4. Patient will identify two changes they are willing to make to overcome these obstacles:    Summary of Patient Progress The facilitator and patient discussed things they could control and not control.  Group members create an individual statement on what they could not control.  The facilitator and patient examine how different experiences can influence their mood.  The patient reflected on how their behaviors shape their current control and things that they can not control.  This distinction allows the patient to honor their limitations and channel their efforts where they will make a difference.     Therapeutic Modalities:   Cognitive Behavioral Therapy Solution  Focused Therapy Motivational Interviewing Relapse Prevention Therapy   Rexene LELON Mae, LCSWA "

## 2024-06-03 NOTE — Group Note (Signed)
 Date:  06/03/2024 Time:  10:07 AM  Group Topic/Focus:  Managing Feelings:   The focus of this group is to identify what feelings patients have difficulty handling and develop a plan to handle them in a healthier way upon discharge.    Participation Level:  Active  Participation Quality:  Appropriate  Affect:  Appropriate  Cognitive:  Appropriate  Insight: Appropriate  Engagement in Group:  Engaged  Modes of Intervention:  Activity  Additional Comments:    Lance [name] 06/03/2024, 10:07 AM

## 2024-06-03 NOTE — Group Note (Deleted)
 Date:  06/03/2024 Time:  8:44 PM  Group Topic/Focus:  Self Esteem Action Plan:   The focus of this group is to help patients create a plan to continue to build self-esteem after discharge.     Participation Level:  {[facility] PARTICIPATION OZCZO:77735}  Participation Quality:  {[facility] PARTICIPATION QUALITY:22265}  Affect:  {[facility] AFFECT:22266}  Cognitive:  {[facility] COGNITIVE:22267}  Insight: {[facility] Insight2:20797}  Engagement in Group:  {[facility] ENGAGEMENT IN HMNLE:77731}  Modes of Intervention:  {[facility] MODES OF INTERVENTION:22269}  Additional Comments:  ***  Lance [name] 06/03/2024, 8:44 PM

## 2024-06-03 NOTE — Group Note (Signed)
 Date:  06/03/2024 Time:  8:47 PM  Group Topic/Focus:  Self Esteem Action Plan:   The focus of this group is to help patients create a plan to continue to build self-esteem after discharge.    Participation Level:  Active  Participation Quality:  Appropriate  Affect:  Flat  Cognitive:  Appropriate  Insight: Appropriate  Engagement in Group:  None  Modes of Intervention:  Discussion  Additional Comments:    Donelle Baba L 06/03/2024, 8:47 PM

## 2024-06-03 NOTE — Progress Notes (Signed)
" °   06/02/24 2000  Psych Admission Type (Psych Patients Only)  Admission Status Involuntary  Psychosocial Assessment  Patient Complaints Sadness  Eye Contact Brief  Facial Expression Sad  Affect Appropriate to circumstance  Speech Soft  Interaction Assertive  Motor Activity Restless  Appearance/Hygiene Improved  Behavior Characteristics Cooperative  Mood Depressed  Aggressive Behavior  Effect No apparent injury  Thought Process  Coherency WDL  Content WDL  Delusions None reported or observed  Perception WDL  Hallucination None reported or observed  Judgment Poor  Confusion None  Danger to Self  Current suicidal ideation? Denies  Self-Injurious Behavior No self-injurious ideation or behavior indicators observed or expressed   Agreement Not to Harm Self Yes  Description of Agreement verbal  Danger to Others  Danger to Others None reported or observed   Patient alert and oriented x 4, affect is flat but brightens upon approach, she denies SI/HI/AVH no distress noted, 15 minutes safety checks maintained will continue to monitor.  "

## 2024-06-03 NOTE — Plan of Care (Signed)
  Problem: Education: Goal: Mental status will improve Outcome: Progressing Goal: Verbalization of understanding the information provided will improve Outcome: Progressing   Problem: Activity: Goal: Interest or engagement in activities will improve Outcome: Progressing Goal: Sleeping patterns will improve Outcome: Progressing   Problem: Coping: Goal: Ability to verbalize frustrations and anger appropriately will improve Outcome: Progressing Goal: Ability to demonstrate self-control will improve Outcome: Progressing   Problem: Health Behavior/Discharge Planning: Goal: Compliance with treatment plan for underlying cause of condition will improve Outcome: Progressing   Problem: Education: Goal: Emotional status will improve Outcome: Not Progressing

## 2024-06-04 LAB — GLUCOSE, CAPILLARY: Glucose-Capillary: 178 mg/dL — ABNORMAL HIGH (ref 70–99)

## 2024-06-04 NOTE — Progress Notes (Signed)
" °   06/04/24 0454  Psych Admission Type (Psych Patients Only)  Admission Status Involuntary  Psychosocial Assessment  Patient Complaints Anxiety  Eye Contact Brief  Facial Expression Sad  Affect Anxious  Speech Soft  Interaction Assertive  Motor Activity Fidgety  Appearance/Hygiene Improved  Behavior Characteristics Cooperative;Appropriate to situation  Mood Depressed  Aggressive Behavior  Effect No apparent injury  Thought Process  Coherency WDL  Content WDL  Delusions WDL  Perception WDL  Hallucination None reported or observed  Judgment WDL  Confusion WDL  Danger to Self  Current suicidal ideation? Denies  Self-Injurious Behavior No self-injurious ideation or behavior indicators observed or expressed   Agreement Not to Harm Self Yes  Description of Agreement verbal   Patient alert and oriented x 4, affect is flat but brightens upon approach , he denies SI/HI/AVH. Patient's thoughts are organized, affect is congruent with mood. 15 minutes safety checksi maintained.  "

## 2024-06-04 NOTE — Progress Notes (Signed)
 [facility] MD Progress Note  06/04/2024 3:53 PM [name]  MRN:  [id number]  From admission/initial presentation:  Lance [name] is a 48 y.o. male admitted: Presented to the ED for 05/20/2024 3:15 PM for Patient arrived with IVC papers. Patient was transported to the crisis center by law enforcement after being found lying in front of tobacco store. Upon contact, he reported hearing voices. SABRA He carries the psychiatric diagnoses of Schizophrenia and has a past medical history of  Chart review significant for diabetes mellitus, hx of pulmonary embolus. Patient is admitted to adult psych unit with Q15 min safety monitoring. Multidisciplinary team approach is offered. Medication management; group/milieu therapy is offered.  Subjective:  Chart reviewed, case discussed in multidisciplinary meeting, patient seen during rounds.   12/21: Patient present in milieu and hallways throughout day. He expresses that he spoke with someone outside of the hospital who mentioned perhaps the group home would give him a second chance. He acknowledges that if they don't, he will be going to jail/prison. He has been in there before and ideally wouldn't want to go back. He expresses anxiety still, due to not knowing. Discussed he is worried about the outcome. He inquired if his medications would be continued after discharge. He would like to continue mental health treatment and his medications.   Denies SI, HI, and AVH. Denies concerns with his medication.   12/20: Patient is present in milieu and dayroom throughout day. He engages with clinical research associate and reports ongoing anxiety. Discussed the difference between normal anxiety and disorder/disruptive anxiety. Patient expectations appear to be having absolutely no anxiety/worrying. Discussed making realistic goals. Patient endorsed that he is hoping he knows more about where he would be going when discharged, worrying about going to prison for violating probation. He denies SI,  HI, and AVH.   12/19: Patient present in hallways and milieu. He expresses he is doing better but he is still having some anxiety. He expresses wanting to give the medication more time. He expresses concern about discharge plans and if he will go to prison (unclear if this is causing lingering anxiety). He expresses concern if he goes to prison if he can get his medication or not. He denies SI, HI, and AVH.   06/01/24: Patient is noted to be resting in his bed.  He reports doing better today.  He rates his depression as 8 out of 10, 10 being the worst and anxiety is 4 out of 10.  He reports that the current stressor is the group home refusing to take him back after 30 days and the possibility of him going to prison.  He was very focused on going to prison and wanted to know if he will be receiving the mental health treatment in the prison and continue his medication.  He reports chronic suicidal ideations stating he struggled with those for a long time but clarifies that they are passive SI with no intent or plan.  He denies auditory/visual hallucinations.  He reports having fair appetite and sleep.  He is noted to be participating in groups and is calm and cooperative.  12/17: On interview today, patient is found in the hallway, he returns to room to engage in interview with provider.  He reports some improvement in anxiety with addition of morning Klonopin  dose.  Per nursing report, patient has been more visible in the milieu.  Patient reports tolerating current medication regimen well without adverse concerns, he denies increased sedation.  He rates depression as  8 out of 10 today.  He states anxiety continues to fluctuate throughout the day and at worst is 9 out of 10.  He continues to report passive suicidal ideation without intent or plan.  He is able to contract for safety in the current environment.  He denies HI/plan. He endorses auditory hallucinations and occasional visual hallucinations.  He  denies command hallucinations.   12/16: On interview today, patient is noted to be resting in bed.  He reports passive SI without intent or plan.  He is able to contract for safety in the current environment.  He denies HI/plan.  He denies current auditory hallucinations, states last episode of this was a couple days ago where he was hearing hollering, screaming.  He denies current visual hallucinations.  He rates depression as 6-7 out of 10 and anxiety at 6 out of 10 today.  He reports fair sleep and good appetite.  He states anxiety fluctuates throughout the day.  He is tolerating current medication regimen well without adverse effects.  He is agreeable to adding Klonopin  dose in the morning.   12/15: patient is reporting that he is feeling better somewhat anxious no perceptual disturbances  12/14: Patient found laying in room for interview. Patient sleeping prior to interview. Upon waking, patient reports he is still having anxieties, a lot of anxieties. Discussed further and patient was able to express a feeling of having to move, shake his leg, or do something consistently (identified these as anxieties). Due to medication regimen, patient at higher risk for Akathisia - consistent movement noted when interviewing patient. Discussed Akathisia and restlessness. Discussed treatment options and patient was open to cogentin .    Denies SI, HI, and AVH. Denies other concerns/complaints.    12/13: Patient found laying in room. He reports he is having a lot of anxieties. When asked further patient is unable to explain anxiety, just reporting it happens when he wakes up and is consistent until he sleeps. He reports he tries to sleep through it.    Denies other concerns complaints. Declines changes to medication regimen at this time. Denies SI, HI, and AVH.    12/12: On interview today, patient is noted to be more awake and alert.  He reports continued improvement in daytime tiredness.  He is noted to  be more interactive on the unit.  He is tolerating medication regimen well.  He rates depression as 7 out of 10 and anxiety at 8 out of 10.  He denies SI/HI/plan.  He denies hallucinations at time of interview.  Will consider lithium  dose increase pending renal labs, ordered today.  Reports having bowel movement yesterday.   12/11: On interview today, patient is found resting in bed.  He reports daytime drowsiness is improving.  Will decrease Klonopin  to 1 mg once daily in the evening and Risperdal  1 mg at bedtime due to daytime drowsiness.  He reports good sleep and stable appetite.  He endorses depressive symptoms but denies anxiety at this time.  He denies SI/HI/plan and denies hallucinations at time of interview.    12/10: On interview today, patient is noted to be resting in bed with one-to-one sitter in the room.  Patient is noted to be drowsy, but wakes on verbal commands.  He reports drowsiness during the day.  He reports good appetite.  He denies current SI at this time.  He denies HI/plan.  He endorses occasional auditory hallucinations but denies command hallucinations.  He endorses visual hallucinations of seeing faces.  He rates anxiety as 7 out of 10 and depression as 7-8 out of 10 today.   12/9: Patient is noted to be resting in bed with one-to-one sitter in the room.  Patient is noted to be very drowsy.  He was able to wake up for the verbal commands.  He reports that he is feeling out of it.  He reports having abdominal pain on the left side.  Being oncological patient encouraged to inform the staff if he is constipated.  Patient reports having bowel movements.  Patient denies having any urinary symptoms.  Patient vitals are stable except for low blood pressure.  Patient received increased dose of Klonopin  during this admission 2 mg twice daily.  Patient reports ongoing thoughts of suicide but denies having any active plan.  Patient denies auditory/visual hallucinations.  Discussed the plan  to reduce the dosing of Klonopin , remove melatonin and discontinue propranolol  due to low blood pressure.  Past Psychiatric History: see h&P Family History:  Family History  Problem Relation Age of Onset   CAD Mother    Rheum arthritis Mother    Healthy Father    CAD Sister    Obesity Sister    Social History:  Social History   Substance and Sexual Activity  Alcohol Use Not Currently   Comment: occassionally     Social History   Substance and Sexual Activity  Drug Use Yes   Types: Marijuana   Comment: Patient reports occasionally    Social History   Socioeconomic History   Marital status: Single    Spouse name: Not on file   Number of children: Not on file   Years of education: Not on file   Highest education level: Not on file  Occupational History   Not on file  Tobacco Use   Smoking status: Every Day    Current packs/day: 1.00    Average packs/day: 1 pack/day for 23.0 years (23.0 ttl pk-yrs)    Types: Cigarettes   Smokeless tobacco: Never   Tobacco comments:    will provide material  Vaping Use   Vaping status: Every Day  Substance and Sexual Activity   Alcohol use: Not Currently    Comment: occassionally   Drug use: Yes    Types: Marijuana    Comment: Patient reports occasionally   Sexual activity: Not Currently    Birth control/protection: None    Comment: occasional marijuana- none recently  Other Topics Concern   Not on file  Social History Narrative   From a group home in [place]   Social Drivers of Health   Tobacco Use: High Risk (05/21/2024)   Patient History    Smoking Tobacco Use: Every Day    Smokeless Tobacco Use: Never    Passive Exposure: Not on file  Financial Resource Strain: Not on file  Food Insecurity: No Food Insecurity (05/21/2024)   Epic    Worried About Programme Researcher, Broadcasting/film/video in the Last Year: Never true    Ran Out of Food in the Last Year: Never true  Transportation Needs: No Transportation Needs (05/21/2024)   Epic     Lack of Transportation (Medical): No    Lack of Transportation (Non-Medical): No  Physical Activity: Not on file  Stress: Not on file  Social Connections: Not on file  Depression (EYV7-0): Not on file  Alcohol Screen: Low Risk (05/21/2024)   Alcohol Screen    Last Alcohol Screening Score (AUDIT): 0  Housing: Low Risk (05/21/2024)   Epic  Unable to Pay for Housing in the Last Year: No    Number of Times Moved in the Last Year: 1    Homeless in the Last Year: No  Utilities: Not At Risk (05/21/2024)   Epic    Threatened with loss of utilities: No  Health Literacy: Not on file   Past Medical History:  Past Medical History:  Diagnosis Date   Depression    Diabetes mellitus without complication (HCC)    Hyperlipidemia    Hypertension    Lupus anticoagulant disorder 06/14/2012   as per Dr.pandit's note in dec 2013.   PE (pulmonary thromboembolism) (HCC)    Schizo affective schizophrenia (HCC)    Supratherapeutic INR 11/19/2016   History reviewed. No pertinent surgical history.  Current Medications: Current Facility-Administered Medications  Medication Dose Route Frequency Provider Last Rate Last Admin   acetaminophen  (TYLENOL ) tablet 650 mg  650 mg Oral Q6H PRN [name], [name], NP   650 mg at 05/22/24 1203   albuterol  (VENTOLIN  HFA) 108 (90 Base) MCG/ACT inhaler 2 puff  2 puff Inhalation Q6H PRN [name], [name], NP       alum & mag hydroxide-simeth (MAALOX/MYLANTA) 200-200-20 MG/5ML suspension 30 mL  30 mL Oral Q4H PRN [name], [name], NP   30 mL at 06/03/24 1747   apixaban  (ELIQUIS ) tablet 2.5 mg  2.5 mg Oral BID [name], [name], NP   2.5 mg at 06/04/24 0812   benztropine  (COGENTIN ) tablet 0.5 mg  0.5 mg Oral Daily Tiandre Teall, NP   0.5 mg at 06/04/24 9188   clonazePAM  (KLONOPIN ) tablet 0.5 mg  0.5 mg Oral Daily [name], [name], PA-C   0.5 mg at 06/04/24 0811   clonazePAM  (KLONOPIN ) tablet 1 mg  1 mg Oral Daily [name], [name], PA-C   1 mg at 06/03/24 1710   cloZAPine  (CLOZARIL )  tablet 250 mg  250 mg Oral QHS [name], [name], NP   250 mg at 06/03/24 2133   haloperidol  (HALDOL ) tablet 5 mg  5 mg Oral TID PRN [name], [name], NP   5 mg at 05/22/24 1424   And   diphenhydrAMINE  (BENADRYL ) capsule 50 mg  50 mg Oral TID PRN [name], [name], NP   50 mg at 05/22/24 1424   haloperidol  lactate (HALDOL ) injection 5 mg  5 mg Intramuscular TID PRN [name], [name], NP   5 mg at 05/21/24 1541   And   diphenhydrAMINE  (BENADRYL ) injection 50 mg  50 mg Intramuscular TID PRN [name], [name], NP       And   LORazepam  (ATIVAN ) injection 2 mg  2 mg Intramuscular TID PRN [name], [name], NP       haloperidol  lactate (HALDOL ) injection 10 mg  10 mg Intramuscular TID PRN [name], [name], NP       And   diphenhydrAMINE  (BENADRYL ) injection 50 mg  50 mg Intramuscular TID PRN [name], [name], NP   50 mg at 05/21/24 1541   And   LORazepam  (ATIVAN ) injection 2 mg  2 mg Intramuscular TID PRN [name], [name], NP   2 mg at 05/21/24 1540   feeding supplement (GLUCERNA SHAKE) (GLUCERNA SHAKE) liquid 237 mL  237 mL Oral TID BM [name], [name], MD   237 mL at 06/04/24 1346   fluticasone  furoate-vilanterol (BREO ELLIPTA ) 200-25 MCG/ACT 1 puff  1 puff Inhalation Daily [name], [name], NP   1 puff at 06/02/24 0805   hydrOXYzine  (ATARAX ) tablet 25 mg  25 mg Oral Q8H PRN [name], [name], MD   25 mg at 06/04/24 1220   lactulose  (CHRONULAC ) 10 GM/15ML solution 20 g  20 g Oral BID PRN Donnelly Mellow, MD   20 g at 05/25/24 1533   lisinopril  (ZESTRIL ) tablet 2.5 mg  2.5 mg Oral Daily [name], [name], NP   2.5 mg at 06/04/24 9188   lithium  carbonate (LITHOBID ) ER tablet 600 mg  600 mg Oral QHS [name], [name], MD   600 mg at 06/03/24 2135   metFORMIN  (GLUCOPHAGE ) tablet 1,000 mg  1,000 mg Oral BID WC [name], [name], NP   1,000 mg at 06/04/24 9188   nicotine  polacrilex (NICORETTE ) gum 2 mg  2 mg Oral PRN Destiney Sanabia, NP   2 mg at 06/04/24 1220   risperiDONE  (RISPERDAL ) tablet 1 mg  1 mg Oral QHS [name], [name], PA-C   1 mg  at 06/03/24 2135   simvastatin  (ZOCOR ) tablet 40 mg  40 mg Oral q1800 [name], [name], MD   40 mg at 06/03/24 1710   traZODone  (DESYREL ) tablet 100 mg  100 mg Oral QHS [name], [name], NP   100 mg at 06/03/24 2135    Lab Results:  Results for orders placed or performed during the hospital encounter of 05/21/24 (from the past 48 hours)  CBC with Differential/Platelet     Status: None   Collection Time: 06/02/24  4:12 PM  Result Value Ref Range   WBC 6.6 4.0 - 10.5 K/uL   RBC 4.50 4.22 - 5.81 MIL/uL   Hemoglobin 14.2 13.0 - 17.0 g/dL   HCT 58.3 60.9 - 47.9 %   MCV 92.4 80.0 - 100.0 fL   MCH 31.6 26.0 - 34.0 pg   MCHC 34.1 30.0 - 36.0 g/dL   RDW 87.8 88.4 - 84.4 %   Platelets 250 150 - 400 K/uL   nRBC 0.0 0.0 - 0.2 %   Neutrophils Relative % 64 %   Neutro Abs 4.2 1.7 - 7.7 K/uL   Lymphocytes Relative 23 %   Lymphs Abs 1.5 0.7 - 4.0 K/uL   Monocytes Relative 12 %   Monocytes Absolute 0.8 0.1 - 1.0 K/uL   Eosinophils Relative 0 %   Eosinophils Absolute 0.0 0.0 - 0.5 K/uL   Basophils Relative 0 %   Basophils Absolute 0.0 0.0 - 0.1 K/uL   Immature Granulocytes 1 %   Abs Immature Granulocytes 0.03 0.00 - 0.07 K/uL    Comment: Performed at [facility], [address] [place], [place] [address]  Glucose, capillary     Status: Abnormal   Collection Time: 06/03/24  7:42 AM  Result Value Ref Range   Glucose-Capillary 148 (H) 70 - 99 mg/dL    Comment: Glucose reference range applies only to samples taken after fasting for at least 8 hours.  Glucose, capillary     Status: Abnormal   Collection Time: 06/04/24  7:27 AM  Result Value Ref Range   Glucose-Capillary 178 (H) 70 - 99 mg/dL    Comment: Glucose reference range applies only to samples taken after fasting for at least 8 hours.    Blood Alcohol level:  Lab Results  Component Value Date   [facility] <15 05/20/2024   [facility] <15 05/19/2024    Metabolic Disorder Labs: Lab Results  Component Value Date   HGBA1C 5.7 (H)  05/22/2024   MPG 116.89 05/22/2024   MPG 93.93 09/10/2023   Lab Results  Component Value Date  PROLACTIN 2.0 (L) 08/31/2016   PROLACTIN 4.8 05/22/2016   Lab Results  Component Value Date   CHOL 160 05/22/2024   TRIG 310 (H) 05/22/2024   HDL 36 (L) 05/22/2024   CHOLHDL 4.4 05/22/2024   VLDL 62 (H) 05/22/2024   LDLCALC 62 05/22/2024   LDLCALC 68 05/02/2024    Physical Findings: AIMS:  , ,  ,  ,    CIWA:    COWS:      Psychiatric Specialty Exam:  Presentation  General Appearance:  Appropriate for Environment  Eye Contact: Good  Speech: Clear and Coherent; Normal Rate  Speech Volume: Normal    Mood and Affect  Mood: Anxious   Affect: Congruent   Thought Process  Thought Processes: Coherent; Linear  Orientation:Full (Time, Place and Person)  Thought Content:Logical  Hallucinations: Denies Ideas of Reference:None  Suicidal Thoughts: Denies Homicidal Thoughts: Denies   Sensorium  Memory: Immediate Fair; Recent Fair  Judgment: Fair  Insight: Good   Executive Functions  Concentration: Fair  Attention Span: Fair  Recall: Fair  Fund of Knowledge: Fair  Language: Fair   Psychomotor Activity  Psychomotor Activity: Normal Musculoskeletal: Strength & Muscle Tone: within normal limits Gait & Station: normal Assets  Assets: Manufacturing Systems Engineer; Desire for Improvement    Physical Exam: Physical Exam Vitals and nursing note reviewed.  Constitutional:      Appearance: Normal appearance.  Pulmonary:     Effort: Pulmonary effort is normal.  Neurological:     Mental Status: He is alert and oriented to person, place, and time.  Psychiatric:        Mood and Affect: Mood normal.        Behavior: Behavior normal.    Review of Systems  Respiratory:  Negative for shortness of breath.   Cardiovascular:  Negative for chest pain.  Gastrointestinal:  Negative for diarrhea, nausea and vomiting.  Psychiatric/Behavioral:   Negative for depression, hallucinations and suicidal ideas. The patient is nervous/anxious.   All other systems reviewed and are negative.  Blood pressure 120/82, pulse 80, temperature 97.8 F (36.6 C), temperature source Oral, resp. rate 19, height 5' 10 (1.778 m), weight 86.3 kg, SpO2 99%. Body mass index is 27.3 kg/m.  Diagnosis: Principal Problem:   Schizophrenia, paranoid (HCC)   PLAN: Safety and Monitoring:  -- Involuntary admission to inpatient psychiatric unit for safety, stabilization and treatment  -- Daily contact with patient to assess and evaluate symptoms and progress in treatment  -- Patient's case to be discussed in multi-disciplinary team meeting  -- Observation Level : q15 minute checks  -- Vital signs:  q12 hours  -- Precautions: suicide, elopement, and assault -- Encouraged patient to participate in unit milieu and in scheduled group therapies  2. Psychiatric Treatment:  Scheduled Medications:  Klonopin  0.5 mg once daily in the morning, 1mg  once daily in the evening -monitor for sedation Clozaril  250 mg daily at bedtime Lithium  ER increased to 600 mg daily at bedtime on 12/18 (repeat lithium  lab level Monday AM)  Risperidone  1 mg at bedtime Trazodone  100 mg daily at bedtime Cogentin  0.5 mg once daily for potential akathisia (patient previously describing anxieties as a consistent feeling/need for movement)   -- The risks/benefits/side-effects/alternatives to this medication were discussed in detail with the patient and time was given for questions. The patient consents to medication trial.   3. Medical Issues Being Addressed:  No acute concerns. Lactulose  for constipation.  4. Discharge Planning:   -- Social work and case management  to assist with discharge planning and identification of hospital follow-up needs prior to discharge  -- Estimated LOS: unable to estimate at this time.    Karsin Pesta, NP 06/04/2024, 3:53 PM

## 2024-06-04 NOTE — Progress Notes (Signed)
" °   06/04/24 0942  Psych Admission Type (Psych Patients Only)  Admission Status Involuntary  Psychosocial Assessment  Patient Complaints Anxiety  Eye Contact Brief  Facial Expression Sad;Anxious  Affect Anxious;Depressed  Speech Soft  Interaction Guarded;Minimal  Motor Activity Slow  Appearance/Hygiene Disheveled  Behavior Characteristics Cooperative;Guarded  Mood Anxious;Depressed  Aggressive Behavior  Effect No apparent injury  Thought Process  Coherency WDL  Content WDL  Delusions None reported or observed  Perception WDL  Hallucination None reported or observed  Judgment WDL  Confusion WDL  Danger to Self  Current suicidal ideation? Denies  Self-Injurious Behavior No self-injurious ideation or behavior indicators observed or expressed   Agreement Not to Harm Self Yes  Description of Agreement verbal  Danger to Others  Danger to Others None reported or observed    "

## 2024-06-04 NOTE — Plan of Care (Signed)
   Problem: Education: Goal: Emotional status will improve Outcome: Progressing Goal: Mental status will improve Outcome: Progressing Goal: Verbalization of understanding the information provided will improve Outcome: Progressing   Problem: Activity: Goal: Interest or engagement in activities will improve Outcome: Progressing Goal: Sleeping patterns will improve Outcome: Progressing   Problem: Coping: Goal: Ability to verbalize frustrations and anger appropriately will improve Outcome: Progressing Goal: Ability to demonstrate self-control will improve Outcome: Progressing   Problem: Health Behavior/Discharge Planning: Goal: Compliance with treatment plan for underlying cause of condition will improve Outcome: Progressing

## 2024-06-04 NOTE — Group Note (Signed)
 Date:  06/04/2024 Time:  10:38 AM  Group Topic/Focus:  Building Self Esteem:   The Focus of this group is helping patients become aware of the effects of self-esteem on their lives, the things they and others do that enhance or undermine their self-esteem, seeing the relationship between their level of self-esteem and the choices they make and learning ways to enhance self-esteem.    Participation Level:  Active  Participation Quality:  Appropriate  Affect:  Appropriate  Cognitive:  Appropriate  Insight: Appropriate  Engagement in Group:  Engaged  Modes of Intervention:  Activity, Education, and Support  Additional Comments:    Lance [name] DELENA June 06/04/2024, 10:38 AM

## 2024-06-04 NOTE — Plan of Care (Signed)
  Problem: Education: Goal: Emotional status will improve Outcome: Progressing   Problem: Education: Goal: Knowledge of [facility] Education information/materials will improve Outcome: Progressing   Problem: Education: Goal: Mental status will improve Outcome: Progressing   Problem: Education: Goal: Verbalization of understanding the information provided will improve Outcome: Progressing

## 2024-06-05 LAB — COMPREHENSIVE METABOLIC PANEL WITH GFR
ALT: 26 U/L (ref 0–44)
AST: 20 U/L (ref 15–41)
Albumin: 4.6 g/dL (ref 3.5–5.0)
Alkaline Phosphatase: 70 U/L (ref 38–126)
Anion gap: 11 (ref 5–15)
BUN: 29 mg/dL — ABNORMAL HIGH (ref 6–20)
CO2: 23 mmol/L (ref 22–32)
Calcium: 10.2 mg/dL (ref 8.9–10.3)
Chloride: 107 mmol/L (ref 98–111)
Creatinine, Ser: 1.01 mg/dL (ref 0.61–1.24)
GFR, Estimated: >60 mL/min
Glucose, Bld: 154 mg/dL — ABNORMAL HIGH (ref 70–99)
Potassium: 4.7 mmol/L (ref 3.5–5.1)
Sodium: 141 mmol/L (ref 135–145)
Total Bilirubin: 0.2 mg/dL (ref 0.0–1.2)
Total Protein: 6.8 g/dL (ref 6.5–8.1)

## 2024-06-05 LAB — CBC WITH DIFFERENTIAL/PLATELET
Abs Immature Granulocytes: 0.03 K/uL (ref 0.00–0.07)
Basophils Absolute: 0 K/uL (ref 0.0–0.1)
Basophils Relative: 0 %
Eosinophils Absolute: 0 K/uL (ref 0.0–0.5)
Eosinophils Relative: 0 %
HCT: 42.6 % (ref 39.0–52.0)
Hemoglobin: 14.7 g/dL (ref 13.0–17.0)
Immature Granulocytes: 1 %
Lymphocytes Relative: 26 %
Lymphs Abs: 1.2 K/uL (ref 0.7–4.0)
MCH: 31.5 pg (ref 26.0–34.0)
MCHC: 34.5 g/dL (ref 30.0–36.0)
MCV: 91.4 fL (ref 80.0–100.0)
Monocytes Absolute: 0.6 K/uL (ref 0.1–1.0)
Monocytes Relative: 13 %
Neutro Abs: 2.8 K/uL (ref 1.7–7.7)
Neutrophils Relative %: 60 %
Platelets: 231 K/uL (ref 150–400)
RBC: 4.66 MIL/uL (ref 4.22–5.81)
RDW: 12 % (ref 11.5–15.5)
WBC: 4.7 K/uL (ref 4.0–10.5)
nRBC: 0 % (ref 0.0–0.2)

## 2024-06-05 LAB — GLUCOSE, CAPILLARY: Glucose-Capillary: 145 mg/dL — ABNORMAL HIGH (ref 70–99)

## 2024-06-05 LAB — LITHIUM LEVEL: Lithium Lvl: 0.8 mmol/L (ref 0.60–1.20)

## 2024-06-05 NOTE — Group Note (Signed)
 Date:  06/05/2024 Time:  9:42 PM  Group Topic/Focus:  Wrap-Up Group:   The focus of this group is to help patients review their daily goal of treatment and discuss progress on daily workbooks.    Participation Level:  Active  Participation Quality:  Appropriate  Affect:  Appropriate  Cognitive:  Appropriate  Insight: Appropriate  Engagement in Group:  Engaged  Modes of Intervention:  Orientation  Additional Comments:    Ginny JONETTA Galeazzi 06/05/2024, 9:42 PM

## 2024-06-05 NOTE — Group Note (Signed)
 Date:  06/05/2024 Time:  12:09 PM  Group Topic/Focus:  Goals Group:   The focus of this group is to help patients establish daily goals to achieve during treatment and discuss how the patient can incorporate goal setting into their daily lives to aide in recovery. We also went over positive thinking and redirecting negative thoughts to positive ones, while making a list of positive what if's to help positive redirection.   Participation Level:  Did Not Attend  Leigh VEAR Pais 06/05/2024, 12:09 PM

## 2024-06-05 NOTE — Plan of Care (Signed)
  Problem: Education: Goal: Mental status will improve Outcome: Progressing Goal: Verbalization of understanding the information provided will improve Outcome: Progressing   Problem: Activity: Goal: Interest or engagement in activities will improve Outcome: Progressing Goal: Sleeping patterns will improve Outcome: Progressing   Problem: Coping: Goal: Ability to verbalize frustrations and anger appropriately will improve Outcome: Progressing Goal: Ability to demonstrate self-control will improve Outcome: Progressing   Problem: Health Behavior/Discharge Planning: Goal: Compliance with treatment plan for underlying cause of condition will improve Outcome: Progressing   Problem: Education: Goal: Emotional status will improve Outcome: Not Progressing

## 2024-06-05 NOTE — Group Note (Signed)
 Recreation Therapy Group Note   Group Topic:Coping Skills  Group Date: 06/05/2024 Start Time: 1515 End Time: 1550 Facilitators: Celestia Jeoffrey FORBES ARTICE, CTRS Location: Craft Room  Group Description: Mind Map.  Patient was provided a blank template of a diagram with 32 blank boxes in a tiered system, branching from the center (similar to a bubble chart). LRT directed patients to label the middle of the diagram Coping Skills. LRT and patients then came up with 8 different coping skills as examples. Pt were directed to record their coping skills in the 2nd tier boxes closest to the center.  Patients would then share their coping skills with the group as LRT wrote them out. LRT gave a handout of 99 different coping skills at the end of group.   Goal Area(s) Addressed: Patients will be able to define coping skills. Patient will identify new coping skills.  Patient will increase communication.  Affect/Mood: N/A   Participation Level: Did not attend    Clinical Observations/Individualized Feedback: Patient did not attend.  Plan: Continue to engage patient in RT group sessions 2-3x/week.   [address], LRT, CTRS 06/05/2024 5:02 PM

## 2024-06-05 NOTE — Care Management Important Message (Signed)
 Important Message  Patient Details  Name: Lance [name] MRN: [id number] Date of Birth: February 12, 1976   Important Message Given:  Yes - Medicare IM     Sherryle JINNY Margo, LCSW 06/05/2024, 2:53 PM

## 2024-06-05 NOTE — Progress Notes (Signed)
" °   06/04/24 2000  Psych Admission Type (Psych Patients Only)  Admission Status Involuntary  Psychosocial Assessment  Patient Complaints Anxiety  Eye Contact Brief  Facial Expression Sad  Affect Anxious  Speech Soft  Interaction Assertive  Motor Activity Fidgety  Appearance/Hygiene Improved  Behavior Characteristics Cooperative;Appropriate to situation  Mood Anxious;Depressed  Aggressive Behavior  Effect No apparent injury  Thought Process  Coherency WDL  Content WDL  Delusions WDL  Perception WDL  Hallucination None reported or observed  Judgment WDL  Confusion WDL  Danger to Self  Current suicidal ideation? Denies  Self-Injurious Behavior No self-injurious ideation or behavior indicators observed or expressed   Agreement Not to Harm Self Yes  Description of Agreement verbal  Danger to Others  Danger to Others None reported or observed   Patient alert and oriented, he denies SI/HI/AVH, interacting with peers and staff appropriately, his thoughts are organized and coherent, 15 minutes safety checks maintained will continue to monitor.    "

## 2024-06-05 NOTE — Progress Notes (Signed)
" °   06/05/24 0900  Psych Admission Type (Psych Patients Only)  Admission Status Involuntary  Psychosocial Assessment  Patient Complaints Anxiety  Eye Contact Brief  Facial Expression Sad;Anxious  Affect Anxious;Depressed  Speech Soft  Interaction Minimal  Motor Activity Fidgety  Appearance/Hygiene Improved  Behavior Characteristics Cooperative;Anxious  Mood Anxious;Depressed  Aggressive Behavior  Effect No apparent injury  Thought Process  Coherency WDL  Content WDL  Delusions None reported or observed  Perception WDL  Hallucination None reported or observed  Judgment WDL  Confusion WDL  Danger to Self  Current suicidal ideation? Denies  Self-Injurious Behavior No self-injurious ideation or behavior indicators observed or expressed   Agreement Not to Harm Self Yes  Description of Agreement verbal  Danger to Others  Danger to Others None reported or observed    "

## 2024-06-05 NOTE — Progress Notes (Signed)
 Pt calm and pleasant during assessment denying SI/HI/AVH. Pt compliant with medication administration per MD orders. Pt given education, support, and encouragement to be active in his treatment plan. Pt being monitored Q 15 minutes for safety per unit protocol, remains safe on the unit

## 2024-06-05 NOTE — [facility] Counselor (Signed)
 CSW spoke with Javon [phone].  He confirmed that he will pick the patient up tomorrow at 1PM.  He asked that medication be sent to [facility] in [place] , [place].  CSW to inform team.   Sherryle Margo, MSW, LCSW 06/05/2024 2:59 PM

## 2024-06-05 NOTE — Group Note (Signed)
"                                                 [facility] LCSW Group Therapy Note    Group Date: 06/05/2024 Start Time: 1300 End Time: 1400  Type of Therapy and Topic:  Group Therapy:  Overcoming Obstacles  Participation Level:  [facility] PARTICIPATION LEVEL: Active  Description of Group:   In this group patients will be encouraged to explore what they see as obstacles to their own wellness and recovery. They will be guided to discuss their thoughts, feelings, and behaviors related to these obstacles. The group will process together ways to cope with barriers, with attention given to specific choices patients can make. Each patient will be challenged to identify changes they are motivated to make in order to overcome their obstacles. This group will be process-oriented, with patients participating in exploration of their own experiences as well as giving and receiving support and challenge from other group members.  Therapeutic Goals: 1. Patient will identify personal and current obstacles as they relate to admission. 2. Patient will identify barriers that currently interfere with their wellness or overcoming obstacles.  3. Patient will identify feelings, thought process and behaviors related to these barriers. 4. Patient will identify two changes they are willing to make to overcome these obstacles:    Summary of Patient Progress Patient was present for the majority of the group process. He was actively engaged in the discussion. Pt comments were pertinent to the discussion. He appeared open and receptive to feedback/comments from both his peers and the facilitator.   Therapeutic Modalities:   Cognitive Behavioral Therapy Solution Focused Therapy Motivational Interviewing Relapse Prevention Therapy   Nadara JONELLE Fam, LCSW "

## 2024-06-05 NOTE — Plan of Care (Signed)
   Problem: Education: Goal: Knowledge of [facility] Education information/materials will improve Outcome: Progressing   Problem: Education: Goal: Emotional status will improve Outcome: Progressing   Problem: Education: Goal: Mental status will improve Outcome: Progressing   Problem: Education: Goal: Verbalization of understanding the information provided will improve Outcome: Progressing

## 2024-06-06 DIAGNOSIS — F200 Paranoid schizophrenia: Secondary | ICD-10-CM | POA: Diagnosis not present

## 2024-06-06 MED ORDER — TRAZODONE HCL 100 MG PO TABS: 100.0000 mg | ORAL_TABLET | Freq: Every day | ORAL | 1 refills | Status: AC

## 2024-06-06 MED ORDER — CLONAZEPAM 0.5 MG PO TABS: 0.5000 mg | ORAL_TABLET | Freq: Every day | ORAL | 0 refills | Status: AC

## 2024-06-06 MED ORDER — CLONAZEPAM 1 MG PO TABS: 1.0000 mg | ORAL_TABLET | Freq: Every day | ORAL | 0 refills | Status: AC

## 2024-06-06 MED ORDER — METFORMIN HCL 1000 MG PO TABS: 1000.0000 mg | ORAL_TABLET | Freq: Two times a day (BID) | ORAL | 1 refills | Status: AC

## 2024-06-06 MED ORDER — CLOZAPINE 50 MG PO TABS: 250.0000 mg | ORAL_TABLET | Freq: Every day | ORAL | 0 refills | Status: AC

## 2024-06-06 MED ORDER — BENZTROPINE MESYLATE 0.5 MG PO TABS: 0.5000 mg | ORAL_TABLET | Freq: Every day | ORAL | 0 refills | Status: AC

## 2024-06-06 MED ORDER — NICOTINE POLACRILEX 2 MG MT GUM: 2.0000 mg | CHEWING_GUM | OROMUCOSAL | 0 refills | Status: AC | PRN

## 2024-06-06 MED ORDER — RISPERIDONE 1 MG PO TABS: 1.0000 mg | ORAL_TABLET | Freq: Every day | ORAL | 0 refills | Status: AC

## 2024-06-06 MED ORDER — LITHIUM CARBONATE ER 300 MG PO TBCR: 600.0000 mg | EXTENDED_RELEASE_TABLET | Freq: Every day | ORAL | 0 refills | Status: AC

## 2024-06-06 MED ORDER — ELIQUIS 2.5 MG PO TABS: 2.5000 mg | ORAL_TABLET | Freq: Two times a day (BID) | ORAL | 0 refills | Status: AC

## 2024-06-06 NOTE — Plan of Care (Signed)
   Problem: Education: Goal: Emotional status will improve Outcome: Progressing Goal: Mental status will improve Outcome: Progressing

## 2024-06-06 NOTE — Progress Notes (Signed)
 [facility] MD Progress Note  06/06/2024 12:09 AM Lance [name]  MRN:  [id number]  From admission/initial presentation:  Lance [name] is a 48 y.o. male admitted: Presented to the ED for 05/20/2024 3:15 PM for Patient arrived with IVC papers. Patient was transported to the crisis center by law enforcement after being found lying in front of tobacco store. Upon contact, he reported hearing voices. SABRA He carries the psychiatric diagnoses of Schizophrenia and has a past medical history of  Chart review significant for diabetes mellitus, hx of pulmonary embolus. Patient is admitted to adult psych unit with Q15 min safety monitoring. Multidisciplinary team approach is offered. Medication management; group/milieu therapy is offered.  Subjective:  Chart reviewed, case discussed in multidisciplinary meeting, patient seen during rounds.  12/22: On interview patient is noted to be resting in bed.  He remains very discharge focused and worry about if the group home is going to give him 2-week notice after the discharge.  Patient and provider discussed at length about his need to maintain his safety in the community so that his placement is not in a jeopardy.  Patient expressed his understanding.  He denies having any side effects to medications.  He consistently denies SI/HI/plan and denies hallucinations.  He is participating in groups on the unit and taking his medications with no reported side effects. 12/21: Patient present in milieu and hallways throughout day. He expresses that he spoke with someone outside of the hospital who mentioned perhaps the group home would give him a second chance. He acknowledges that if they don't, he will be going to jail/prison. He has been in there before and ideally wouldn't want to go back. He expresses anxiety still, due to not knowing. Discussed he is worried about the outcome. He inquired if his medications would be continued after discharge. He would like to continue mental  health treatment and his medications.   Denies SI, HI, and AVH. Denies concerns with his medication.   12/20: Patient is present in milieu and dayroom throughout day. He engages with clinical research associate and reports ongoing anxiety. Discussed the difference between normal anxiety and disorder/disruptive anxiety. Patient expectations appear to be having absolutely no anxiety/worrying. Discussed making realistic goals. Patient endorsed that he is hoping he knows more about where he would be going when discharged, worrying about going to prison for violating probation. He denies SI, HI, and AVH.   12/19: Patient present in hallways and milieu. He expresses he is doing better but he is still having some anxiety. He expresses wanting to give the medication more time. He expresses concern about discharge plans and if he will go to prison (unclear if this is causing lingering anxiety). He expresses concern if he goes to prison if he can get his medication or not. He denies SI, HI, and AVH.   06/01/24: Patient is noted to be resting in his bed.  He reports doing better today.  He rates his depression as 8 out of 10, 10 being the worst and anxiety is 4 out of 10.  He reports that the current stressor is the group home refusing to take him back after 30 days and the possibility of him going to prison.  He was very focused on going to prison and wanted to know if he will be receiving the mental health treatment in the prison and continue his medication.  He reports chronic suicidal ideations stating he struggled with those for a long time but clarifies that they are passive  SI with no intent or plan.  He denies auditory/visual hallucinations.  He reports having fair appetite and sleep.  He is noted to be participating in groups and is calm and cooperative.  12/17: On interview today, patient is found in the hallway, he returns to room to engage in interview with provider.  He reports some improvement in anxiety with addition of  morning Klonopin  dose.  Per nursing report, patient has been more visible in the milieu.  Patient reports tolerating current medication regimen well without adverse concerns, he denies increased sedation.  He rates depression as 8 out of 10 today.  He states anxiety continues to fluctuate throughout the day and at worst is 9 out of 10.  He continues to report passive suicidal ideation without intent or plan.  He is able to contract for safety in the current environment.  He denies HI/plan. He endorses auditory hallucinations and occasional visual hallucinations.  He denies command hallucinations.   12/16: On interview today, patient is noted to be resting in bed.  He reports passive SI without intent or plan.  He is able to contract for safety in the current environment.  He denies HI/plan.  He denies current auditory hallucinations, states last episode of this was a couple days ago where he was hearing hollering, screaming.  He denies current visual hallucinations.  He rates depression as 6-7 out of 10 and anxiety at 6 out of 10 today.  He reports fair sleep and good appetite.  He states anxiety fluctuates throughout the day.  He is tolerating current medication regimen well without adverse effects.  He is agreeable to adding Klonopin  dose in the morning.   12/15: patient is reporting that he is feeling better somewhat anxious no perceptual disturbances  12/14: Patient found laying in room for interview. Patient sleeping prior to interview. Upon waking, patient reports he is still having anxieties, a lot of anxieties. Discussed further and patient was able to express a feeling of having to move, shake his leg, or do something consistently (identified these as anxieties). Due to medication regimen, patient at higher risk for Akathisia - consistent movement noted when interviewing patient. Discussed Akathisia and restlessness. Discussed treatment options and patient was open to cogentin .    Denies SI, HI,  and AVH. Denies other concerns/complaints.    12/13: Patient found laying in room. He reports he is having a lot of anxieties. When asked further patient is unable to explain anxiety, just reporting it happens when he wakes up and is consistent until he sleeps. He reports he tries to sleep through it.    Denies other concerns complaints. Declines changes to medication regimen at this time. Denies SI, HI, and AVH.    12/12: On interview today, patient is noted to be more awake and alert.  He reports continued improvement in daytime tiredness.  He is noted to be more interactive on the unit.  He is tolerating medication regimen well.  He rates depression as 7 out of 10 and anxiety at 8 out of 10.  He denies SI/HI/plan.  He denies hallucinations at time of interview.  Will consider lithium  dose increase pending renal labs, ordered today.  Reports having bowel movement yesterday.   12/11: On interview today, patient is found resting in bed.  He reports daytime drowsiness is improving.  Will decrease Klonopin  to 1 mg once daily in the evening and Risperdal  1 mg at bedtime due to daytime drowsiness.  He reports good sleep and stable  appetite.  He endorses depressive symptoms but denies anxiety at this time.  He denies SI/HI/plan and denies hallucinations at time of interview.    12/10: On interview today, patient is noted to be resting in bed with one-to-one sitter in the room.  Patient is noted to be drowsy, but wakes on verbal commands.  He reports drowsiness during the day.  He reports good appetite.  He denies current SI at this time.  He denies HI/plan.  He endorses occasional auditory hallucinations but denies command hallucinations.  He endorses visual hallucinations of seeing faces.  He rates anxiety as 7 out of 10 and depression as 7-8 out of 10 today.   12/9: Patient is noted to be resting in bed with one-to-one sitter in the room.  Patient is noted to be very drowsy.  He was able to wake up for  the verbal commands.  He reports that he is feeling out of it.  He reports having abdominal pain on the left side.  Being oncological patient encouraged to inform the staff if he is constipated.  Patient reports having bowel movements.  Patient denies having any urinary symptoms.  Patient vitals are stable except for low blood pressure.  Patient received increased dose of Klonopin  during this admission 2 mg twice daily.  Patient reports ongoing thoughts of suicide but denies having any active plan.  Patient denies auditory/visual hallucinations.  Discussed the plan to reduce the dosing of Klonopin , remove melatonin and discontinue propranolol  due to low blood pressure.  Past Psychiatric History: see h&P Family History:  Family History  Problem Relation Age of Onset   CAD Mother    Rheum arthritis Mother    Healthy Father    CAD Sister    Obesity Sister    Social History:  Social History   Substance and Sexual Activity  Alcohol Use Not Currently   Comment: occassionally     Social History   Substance and Sexual Activity  Drug Use Yes   Types: Marijuana   Comment: Patient reports occasionally    Social History   Socioeconomic History   Marital status: Single    Spouse name: Not on file   Number of children: Not on file   Years of education: Not on file   Highest education level: Not on file  Occupational History   Not on file  Tobacco Use   Smoking status: Every Day    Current packs/day: 1.00    Average packs/day: 1 pack/day for 23.0 years (23.0 ttl pk-yrs)    Types: Cigarettes   Smokeless tobacco: Never   Tobacco comments:    will provide material  Vaping Use   Vaping status: Every Day  Substance and Sexual Activity   Alcohol use: Not Currently    Comment: occassionally   Drug use: Yes    Types: Marijuana    Comment: Patient reports occasionally   Sexual activity: Not Currently    Birth control/protection: None    Comment: occasional marijuana- none recently   Other Topics Concern   Not on file  Social History Narrative   From a group home in [place]   Social Drivers of Health   Tobacco Use: High Risk (05/21/2024)   Patient History    Smoking Tobacco Use: Every Day    Smokeless Tobacco Use: Never    Passive Exposure: Not on file  Financial Resource Strain: Not on file  Food Insecurity: No Food Insecurity (05/21/2024)   Epic    Worried About Running  Out of Food in the Last Year: Never true    Ran Out of Food in the Last Year: Never true  Transportation Needs: No Transportation Needs (05/21/2024)   Epic    Lack of Transportation (Medical): No    Lack of Transportation (Non-Medical): No  Physical Activity: Not on file  Stress: Not on file  Social Connections: Not on file  Depression (EYV7-0): Not on file  Alcohol Screen: Low Risk (05/21/2024)   Alcohol Screen    Last Alcohol Screening Score (AUDIT): 0  Housing: Low Risk (05/21/2024)   Epic    Unable to Pay for Housing in the Last Year: No    Number of Times Moved in the Last Year: 1    Homeless in the Last Year: No  Utilities: Not At Risk (05/21/2024)   Epic    Threatened with loss of utilities: No  Health Literacy: Not on file   Past Medical History:  Past Medical History:  Diagnosis Date   Depression    Diabetes mellitus without complication (HCC)    Hyperlipidemia    Hypertension    Lupus anticoagulant disorder 06/14/2012   as per Dr.pandit's note in dec 2013.   PE (pulmonary thromboembolism) (HCC)    Schizo affective schizophrenia (HCC)    Supratherapeutic INR 11/19/2016   History reviewed. No pertinent surgical history.  Current Medications: Current Facility-Administered Medications  Medication Dose Route Frequency Provider Last Rate Last Admin   acetaminophen  (TYLENOL ) tablet 650 mg  650 mg Oral Q6H PRN [name], [name], NP   650 mg at 05/22/24 1203   albuterol  (VENTOLIN  HFA) 108 (90 Base) MCG/ACT inhaler 2 puff  2 puff Inhalation Q6H PRN [name], [name], NP        alum & mag hydroxide-simeth (MAALOX/MYLANTA) 200-200-20 MG/5ML suspension 30 mL  30 mL Oral Q4H PRN [name], [name], NP   30 mL at 06/03/24 1747   apixaban  (ELIQUIS ) tablet 2.5 mg  2.5 mg Oral BID [name], [name], NP   2.5 mg at 06/05/24 1700   benztropine  (COGENTIN ) tablet 0.5 mg  0.5 mg Oral Daily May, Tanya, NP   0.5 mg at 06/05/24 9250   clonazePAM  (KLONOPIN ) tablet 0.5 mg  0.5 mg Oral Daily [name], [name], PA-C   0.5 mg at 06/05/24 9250   clonazePAM  (KLONOPIN ) tablet 1 mg  1 mg Oral Daily [name], [name], PA-C   1 mg at 06/05/24 1659   cloZAPine  (CLOZARIL ) tablet 250 mg  250 mg Oral QHS [name], [name], NP   250 mg at 06/05/24 2037   haloperidol  (HALDOL ) tablet 5 mg  5 mg Oral TID PRN [name], [name], NP   5 mg at 05/22/24 1424   And   diphenhydrAMINE  (BENADRYL ) capsule 50 mg  50 mg Oral TID PRN [name], [name], NP   50 mg at 05/22/24 1424   haloperidol  lactate (HALDOL ) injection 5 mg  5 mg Intramuscular TID PRN [name], [name], NP   5 mg at 05/21/24 1541   And   diphenhydrAMINE  (BENADRYL ) injection 50 mg  50 mg Intramuscular TID PRN [name], [name], NP       And   LORazepam  (ATIVAN ) injection 2 mg  2 mg Intramuscular TID PRN [name], [name], NP       haloperidol  lactate (HALDOL ) injection 10 mg  10 mg Intramuscular TID PRN [name], [name], NP       And   diphenhydrAMINE  (BENADRYL ) injection 50 mg  50 mg Intramuscular  TID PRN [name], [name], NP   50 mg at 05/21/24 1541   And   LORazepam  (ATIVAN ) injection 2 mg  2 mg Intramuscular TID PRN [name], [name], NP   2 mg at 05/21/24 1540   feeding supplement (GLUCERNA SHAKE) (GLUCERNA SHAKE) liquid 237 mL  237 mL Oral TID BM Myrick Mcnairy, MD   237 mL at 06/05/24 2037   fluticasone  furoate-vilanterol (BREO ELLIPTA ) 200-25 MCG/ACT 1 puff  1 puff Inhalation Daily [name], [name], NP   1 puff at 06/02/24 0805   hydrOXYzine  (ATARAX ) tablet 25 mg  25 mg Oral Q8H PRN Janann Boeve, MD   25 mg at 06/05/24 1226   lactulose  (CHRONULAC ) 10 GM/15ML solution  20 g  20 g Oral BID PRN Kellin Bartling, MD   20 g at 05/25/24 1533   lisinopril  (ZESTRIL ) tablet 2.5 mg  2.5 mg Oral Daily [name], [name], NP   2.5 mg at 06/05/24 9250   lithium  carbonate (LITHOBID ) ER tablet 600 mg  600 mg Oral QHS Murdock Jellison, MD   600 mg at 06/05/24 2037   metFORMIN  (GLUCOPHAGE ) tablet 1,000 mg  1,000 mg Oral BID WC [name], [name], NP   1,000 mg at 06/05/24 1659   nicotine  polacrilex (NICORETTE ) gum 2 mg  2 mg Oral PRN May, Tanya, NP   2 mg at 06/04/24 1220   risperiDONE  (RISPERDAL ) tablet 1 mg  1 mg Oral QHS [name], [name], PA-C   1 mg at 06/05/24 2037   simvastatin  (ZOCOR ) tablet 40 mg  40 mg Oral q1800 [name], [name], MD   40 mg at 06/05/24 1659   traZODone  (DESYREL ) tablet 100 mg  100 mg Oral QHS [name], [name], NP   100 mg at 06/05/24 2037    Lab Results:  Results for orders placed or performed during the hospital encounter of 05/21/24 (from the past 48 hours)  Glucose, capillary     Status: Abnormal   Collection Time: 06/04/24  7:27 AM  Result Value Ref Range   Glucose-Capillary 178 (H) 70 - 99 mg/dL    Comment: Glucose reference range applies only to samples taken after fasting for at least 8 hours.  Glucose, capillary     Status: Abnormal   Collection Time: 06/05/24  7:37 AM  Result Value Ref Range   Glucose-Capillary 145 (H) 70 - 99 mg/dL    Comment: Glucose reference range applies only to samples taken after fasting for at least 8 hours.  CBC with Differential/Platelet     Status: None   Collection Time: 06/05/24 10:31 AM  Result Value Ref Range   WBC 4.7 4.0 - 10.5 K/uL   RBC 4.66 4.22 - 5.81 MIL/uL   Hemoglobin 14.7 13.0 - 17.0 g/dL   HCT 57.3 60.9 - 47.9 %   MCV 91.4 80.0 - 100.0 fL   MCH 31.5 26.0 - 34.0 pg   MCHC 34.5 30.0 - 36.0 g/dL   RDW 87.9 88.4 - 84.4 %   Platelets 231 150 - 400 K/uL   nRBC 0.0 0.0 - 0.2 %   Neutrophils Relative % 60 %   Neutro Abs 2.8 1.7 - 7.7 K/uL   Lymphocytes Relative 26 %   Lymphs Abs 1.2 0.7 - 4.0 K/uL    Monocytes Relative 13 %   Monocytes Absolute 0.6 0.1 - 1.0 K/uL   Eosinophils Relative 0 %   Eosinophils Absolute 0.0 0.0 - 0.5 K/uL   Basophils Relative 0 %  Basophils Absolute 0.0 0.0 - 0.1 K/uL   Immature Granulocytes 1 %   Abs Immature Granulocytes 0.03 0.00 - 0.07 K/uL    Comment: Performed at [facility], [address] [place], [place] [address]  Lithium  level     Status: None   Collection Time: 06/05/24 10:31 AM  Result Value Ref Range   Lithium  Lvl 0.80 0.60 - 1.20 mmol/L    Comment: Performed at [facility], [address] [place], [place] [address]  Comprehensive metabolic panel     Status: Abnormal   Collection Time: 06/05/24 10:31 AM  Result Value Ref Range   Sodium 141 135 - 145 mmol/L   Potassium 4.7 3.5 - 5.1 mmol/L   Chloride 107 98 - 111 mmol/L   CO2 23 22 - 32 mmol/L   Glucose, Bld 154 (H) 70 - 99 mg/dL    Comment: Glucose reference range applies only to samples taken after fasting for at least 8 hours.   BUN 29 (H) 6 - 20 mg/dL   Creatinine, Ser 8.98 0.61 - 1.24 mg/dL   Calcium 89.7 8.9 - 89.6 mg/dL   Total Protein 6.8 6.5 - 8.1 g/dL   Albumin 4.6 3.5 - 5.0 g/dL   AST 20 15 - 41 U/L   ALT 26 0 - 44 U/L   Alkaline Phosphatase 70 38 - 126 U/L   Total Bilirubin 0.2 0.0 - 1.2 mg/dL   GFR, Estimated >39 >39 mL/min    Comment: (NOTE) Calculated using the CKD-EPI Creatinine Equation (2021)    Anion gap 11 5 - 15    Comment: Performed at [facility], [address] [place], [place] [address]    Blood Alcohol level:  Lab Results  Component Value Date   [facility] <15 05/20/2024   ETH <15 05/19/2024    Metabolic Disorder Labs: Lab Results  Component Value Date   HGBA1C 5.7 (H) 05/22/2024   MPG 116.89 05/22/2024   MPG 93.93 09/10/2023   Lab Results  Component Value Date   PROLACTIN 2.0 (L) 08/31/2016   PROLACTIN 4.8 05/22/2016   Lab Results  Component Value Date   CHOL 160 05/22/2024   TRIG 310 (H) 05/22/2024    HDL 36 (L) 05/22/2024   CHOLHDL 4.4 05/22/2024   VLDL 62 (H) 05/22/2024   LDLCALC 62 05/22/2024   LDLCALC 68 05/02/2024    Physical Findings: AIMS:  , ,  ,  ,    CIWA:    COWS:      Psychiatric Specialty Exam:  Presentation  General Appearance:  Appropriate for Environment  Eye Contact: Good  Speech: Clear and Coherent; Normal Rate  Speech Volume: Normal    Mood and Affect  Mood: Anxious   Affect: Congruent   Thought Process  Thought Processes: Coherent; Linear  Orientation:Full (Time, Place and Person)  Thought Content:Logical  Hallucinations: Denies Ideas of Reference:None  Suicidal Thoughts: Denies Homicidal Thoughts: Denies   Sensorium  Memory: Immediate Fair; Recent Fair  Judgment: Fair  Insight: Good   Executive Functions  Concentration: Fair  Attention Span: Fair  Recall: Fair  Fund of Knowledge: Fair  Language: Fair   Psychomotor Activity  Psychomotor Activity: Normal Musculoskeletal: Strength & Muscle Tone: within normal limits Gait & Station: normal Assets  Assets: Manufacturing Systems Engineer; Desire for Improvement    Physical Exam: Physical Exam Vitals and nursing note reviewed.  Constitutional:      Appearance: Normal appearance.  Pulmonary:     Effort: Pulmonary effort is  normal.  Neurological:     Mental Status: He is alert and oriented to person, place, and time.  Psychiatric:        Mood and Affect: Mood normal.        Behavior: Behavior normal.    Review of Systems  Respiratory:  Negative for shortness of breath.   Cardiovascular:  Negative for chest pain.  Gastrointestinal:  Negative for diarrhea, nausea and vomiting.  Psychiatric/Behavioral:  Negative for depression, hallucinations and suicidal ideas. The patient is nervous/anxious.   All other systems reviewed and are negative.  Blood pressure (!) 132/91, pulse 88, temperature 97.8 F (36.6 C), temperature source Oral, resp. rate 20, height  5' 10 (1.778 m), weight 86.3 kg, SpO2 98%. Body mass index is 27.3 kg/m.  Diagnosis: Principal Problem:   Schizophrenia, paranoid (HCC)   PLAN: Safety and Monitoring:  -- Involuntary admission to inpatient psychiatric unit for safety, stabilization and treatment  -- Daily contact with patient to assess and evaluate symptoms and progress in treatment  -- Patient's case to be discussed in multi-disciplinary team meeting  -- Observation Level : q15 minute checks  -- Vital signs:  q12 hours  -- Precautions: suicide, elopement, and assault -- Encouraged patient to participate in unit milieu and in scheduled group therapies  2. Psychiatric Treatment:  Scheduled Medications:  Klonopin  0.5 mg once daily in the morning, 1mg  once daily in the evening -monitor for sedation Clozaril  250 mg daily at bedtime Lithium  ER increased to 600 mg daily at bedtime on 12/18 (repeat lithium  lab level Monday AM)  Risperidone  1 mg at bedtime Trazodone  100 mg daily at bedtime Cogentin  0.5 mg once daily for potential akathisia (patient previously describing anxieties as a consistent feeling/need for movement)   -- The risks/benefits/side-effects/alternatives to this medication were discussed in detail with the patient and time was given for questions. The patient consents to medication trial.   3. Medical Issues Being Addressed:  No acute concerns. Lactulose  for constipation.  4. Discharge Planning:   -- Social work and case management to assist with discharge planning and identification of hospital follow-up needs prior to discharge  -- Estimated LOS: unable to estimate at this time.    Allyn Foil, MD 06/06/2024, 12:09 AM

## 2024-06-06 NOTE — [facility] Suicide Risk Assessment (Signed)
 [facility] Discharge Suicide Risk Assessment   Principal Problem: Schizophrenia, paranoid (HCC) Discharge Diagnoses: Principal Problem:   Schizophrenia, paranoid (HCC)   Total Time spent with patient: 30 minutes  Musculoskeletal: Strength & Muscle Tone: within normal limits Gait & Station: normal Patient leans: N/A  Psychiatric Specialty Exam  Presentation  General Appearance:  Appropriate for Environment  Eye Contact: Fair  Speech: Clear and Coherent  Speech Volume: Normal  Handedness: Right   Mood and Affect  Mood: Euthymic  Duration of Depression Symptoms: Greater than two weeks  Affect: Appropriate   Thought Process  Thought Processes: Coherent  Descriptions of Associations:Intact  Orientation:Full (Time, Place and Person)  Thought Content:Logical  History of Schizophrenia/Schizoaffective disorder:Yes  Duration of Psychotic Symptoms:Greater than six months  Hallucinations:Hallucinations: None  Ideas of Reference:None  Suicidal Thoughts:Suicidal Thoughts: No  Homicidal Thoughts:Homicidal Thoughts: No   Sensorium  Memory: Immediate Fair  Judgment: Fair  Insight: Fair   Art Therapist  Concentration: Fair  Attention Span: Fair  Recall: [employer] of Knowledge: Fair  Language: Fair   Psychomotor Activity  Psychomotor Activity: Psychomotor Activity: Normal   Assets  Assets: Communication Skills; Desire for Improvement; Resilience   Sleep  Sleep: Sleep: Fair  Estimated Sleeping Duration (Last 24 Hours): 7.75-8.75 hours  Physical Exam: Physical Exam ROS Blood pressure (!) 132/91, pulse 88, temperature 97.8 F (36.6 C), temperature source Oral, resp. rate 20, height 5' 10 (1.778 m), weight 86.3 kg, SpO2 98%. Body mass index is 27.3 kg/m.  Mental Status Per Nursing Assessment::   On Admission:  Suicidal ideation indicated by patient  Demographic Factors:  Male  Loss Factors: Decrease in vocational  status  Historical Factors: Impulsivity  Risk Reduction Factors:   Living with another person, especially a relative, Positive social support, Positive therapeutic relationship, and Positive coping skills or problem solving skills  Continued Clinical Symptoms:  Schizophrenia:   Paranoid or undifferentiated type  Cognitive Features That Contribute To Risk:  None    Suicide Risk:  Minimal: No identifiable suicidal ideation.  Patients presenting with no risk factors but with morbid ruminations; may be classified as minimal risk based on the severity of the depressive symptoms   Follow-up Information     [facility] Follow up.   Why: In person medication maangement appointment is 06/28/23 at 2:20 PM with Jacques Naegeli, PA-C. Contact information: [address]  [place], [place] [address]  [phone]  Fax: [phone]                Plan Of Care/Follow-up recommendations:  Activity:  as tolerated  Allyn Foil, MD 06/06/2024, 10:41 AM

## 2024-06-06 NOTE — Discharge Summary (Signed)
 " Physician Discharge Summary Note  Patient:  Lance [name] is an 48 y.o., male MRN:  [id number] DOB:  1975/12/05 Patient phone:  [phone] (home)  Patient address:   [facility] [place] [address],   Total time spent: 40 min Date of Admission:  05/21/2024 Date of Discharge: 06/06/2024  Reason for Admission:  Lance [name] is a 48 y.o. male admitted: Presented to the ED for 05/20/2024 3:15 PM for Patient arrived with IVC papers. Patient was transported to the crisis center by law enforcement after being found lying in front of tobacco store. Upon contact, he reported hearing voices. SABRA He carries the psychiatric diagnoses of Schizophrenia and has a past medical history of  Chart review significant for diabetes mellitus, hx of pulmonary embolus. Patient is admitted to adult psych unit with Q15 min safety monitoring. Multidisciplinary team approach is offered. Medication management; group/milieu therapy is offered   Principal Problem: Schizophrenia, paranoid (HCC) Discharge Diagnoses: Principal Problem:   Schizophrenia, paranoid (HCC)   Past Psychiatric History: see h&p  Family Psychiatric  History: see h&p Social History:  Social History   Substance and Sexual Activity  Alcohol Use Not Currently   Comment: occassionally     Social History   Substance and Sexual Activity  Drug Use Yes   Types: Marijuana   Comment: Patient reports occasionally    Social History   Socioeconomic History   Marital status: Single    Spouse name: Not on file   Number of children: Not on file   Years of education: Not on file   Highest education level: Not on file  Occupational History   Not on file  Tobacco Use   Smoking status: Every Day    Current packs/day: 1.00    Average packs/day: 1 pack/day for 23.0 years (23.0 ttl pk-yrs)    Types: Cigarettes   Smokeless tobacco: Never   Tobacco comments:    will provide material  Vaping Use   Vaping status: Every Day  Substance and  Sexual Activity   Alcohol use: Not Currently    Comment: occassionally   Drug use: Yes    Types: Marijuana    Comment: Patient reports occasionally   Sexual activity: Not Currently    Birth control/protection: None    Comment: occasional marijuana- none recently  Other Topics Concern   Not on file  Social History Narrative   From a group home in [place]   Social Drivers of Health   Tobacco Use: High Risk (05/21/2024)   Patient History    Smoking Tobacco Use: Every Day    Smokeless Tobacco Use: Never    Passive Exposure: Not on file  Financial Resource Strain: Not on file  Food Insecurity: No Food Insecurity (05/21/2024)   Epic    Worried About Programme Researcher, Broadcasting/film/video in the Last Year: Never true    Ran Out of Food in the Last Year: Never true  Transportation Needs: No Transportation Needs (05/21/2024)   Epic    Lack of Transportation (Medical): No    Lack of Transportation (Non-Medical): No  Physical Activity: Not on file  Stress: Not on file  Social Connections: Not on file  Depression (EYV7-0): Not on file  Alcohol Screen: Low Risk (05/21/2024)   Alcohol Screen    Last Alcohol Screening Score (AUDIT): 0  Housing: Low Risk (05/21/2024)   Epic    Unable to Pay for Housing in the Last Year: No    Number of Times Moved in the Last  Year: 1    Homeless in the Last Year: No  Utilities: Not At Risk (05/21/2024)   Epic    Threatened with loss of utilities: No  Health Literacy: Not on file   Past Medical History:  Past Medical History:  Diagnosis Date   Depression    Diabetes mellitus without complication (HCC)    Hyperlipidemia    Hypertension    Lupus anticoagulant disorder 06/14/2012   as per Dr.pandit's note in dec 2013.   PE (pulmonary thromboembolism) (HCC)    Schizo affective schizophrenia (HCC)    Supratherapeutic INR 11/19/2016   History reviewed. No pertinent surgical history. Family History:  Family History  Problem Relation Age of Onset   CAD Mother     Rheum arthritis Mother    Healthy Father    CAD Sister    Obesity Sister     Hospital Course:  Lance [name] is a 48 y.o. male admitted: Presented to the ED for 05/20/2024 3:15 PM for Patient arrived with IVC papers. Patient was transported to the crisis center by law enforcement after being found lying in front of tobacco store. Upon contact, he reported hearing voices. SABRA He carries the psychiatric diagnoses of Schizophrenia and has a past medical history of  Chart review significant for diabetes mellitus, hx of pulmonary embolus. Patient is admitted to adult psych unit with Q15 min safety monitoring. Multidisciplinary team approach is offered. Medication management; group/milieu therapy is offered   On admission, patient maintained safe behaviors on the unit.  Initial few days patient was extremely sedated and some of his medications had to be discontinued and modified to keep him more alert.  Klonopin  was reduced to 0.5 mg every morning and 1 mg nightly.  Clazuril to 50 mg daily at bedtime.  Lithium  extended release was increased to 600 mg daily at bedtime to optimize the lithium  serum levels for mood stabilization.  Risperdal  was reduced to 1 mg nightly to help with psychosis and mood stability.  Trazodone  100 mg nightly was maintained to help with insomnia.  Cogentin  0.5 mg daily was added as patient reported side effects to antipsychotics describing it as anxiety is and akathisia.  Patient tolerated medications very well and showed significant improvement in his depression and anxiety.  He was participating in groups and was more visible on the unit.  He maintained safe behaviors consistently.  Discharge planning was discussed with patient and his legal guardian.  Patient was discharged back to his group home  Detailed risk assessment is complete based on clinical exam and individual risk factors and acute suicide risk is low and acute violence risk is low.    On the day of discharge, patient  denies SI/HI/plan and denies hallucinations.  Patient remains future oriented and is willing to participate in outpatient mental health services.  Currently, all modifiable risk of harm to self/harm to others have been addressed and patient is no longer appropriate for the acute inpatient setting and is able to continue treatment for mental health needs in the community with the supports as indicated below.  Patient is educated and verbalized understanding of discharge plan of care including medications, follow-up appointments, mental health resources and further crisis services in the community.  He is instructed to call 911 or present to the nearest emergency room should he experience any decompensation in mood, disturbance of bowel or return of suicidal/homicidal ideations.  Patient verbalizes understanding of this education and agrees to this plan of care  Physical Findings:  AIMS:  , ,  ,  ,    CIWA:    COWS:      Psychiatric Specialty Exam:  Presentation  General Appearance:  Appropriate for Environment  Eye Contact: Fair  Speech: Clear and Coherent  Speech Volume: Normal    Mood and Affect  Mood: Euthymic  Affect: Appropriate   Thought Process  Thought Processes: Coherent  Descriptions of Associations:Intact  Orientation:Full (Time, Place and Person)  Thought Content:Logical  Hallucinations:Hallucinations: None  Ideas of Reference:None  Suicidal Thoughts:Suicidal Thoughts: No  Homicidal Thoughts:Homicidal Thoughts: No   Sensorium  Memory: Immediate Fair  Judgment: Fair  Insight: Fair   Art Therapist  Concentration: Fair  Attention Span: Fair  Recall: Fair  Fund of Knowledge: Fair  Language: Fair   Psychomotor Activity  Psychomotor Activity: Psychomotor Activity: Normal  Musculoskeletal: Strength & Muscle Tone: within normal limits Gait & Station: normal Assets  Assets: Manufacturing Systems Engineer; Desire for Improvement;  Resilience   Sleep  Sleep: Sleep: Fair    Physical Exam: Physical Exam ROS Blood pressure (!) 132/91, pulse 88, temperature 97.8 F (36.6 C), temperature source Oral, resp. rate 20, height 5' 10 (1.778 m), weight 86.3 kg, SpO2 98%. Body mass index is 27.3 kg/m.   Tobacco Use History[1] Tobacco Cessation:  A prescription for an FDA-approved tobacco cessation medication provided at discharge   Blood Alcohol level:  Lab Results  Component Value Date   [facility] <15 05/20/2024   ETH <15 05/19/2024    Metabolic Disorder Labs:  Lab Results  Component Value Date   HGBA1C 5.7 (H) 05/22/2024   MPG 116.89 05/22/2024   MPG 93.93 09/10/2023   Lab Results  Component Value Date   PROLACTIN 2.0 (L) 08/31/2016   PROLACTIN 4.8 05/22/2016   Lab Results  Component Value Date   CHOL 160 05/22/2024   TRIG 310 (H) 05/22/2024   HDL 36 (L) 05/22/2024   CHOLHDL 4.4 05/22/2024   VLDL 62 (H) 05/22/2024   LDLCALC 62 05/22/2024   LDLCALC 68 05/02/2024    See Psychiatric Specialty Exam and Suicide Risk Assessment completed by Attending Physician prior to discharge.  Discharge destination:  ALF  Is patient on multiple antipsychotic therapies at discharge:  Yes,   Do you recommend tapering to monotherapy for antipsychotics?  Yes   Has Patient had three or more failed trials of antipsychotic monotherapy by history:  No  Recommended Plan for Multiple Antipsychotic Therapies: Taper to monotherapy as described:  Once patient is more psychiatrically stable in the community  Discharge Instructions     Increase activity slowly   Complete by: As directed       Allergies as of 06/06/2024       Reactions   Penicillins Other (See Comments)   Reaction: lockjaw as teen Tolerated Augmentin /Unasyn  06/09/22        Medication List     STOP taking these medications    melatonin 5 MG Tabs   propranolol  20 MG tablet Commonly known as: INDERAL        TAKE these medications       Indication  benztropine  0.5 MG tablet Commonly known as: COGENTIN  Take 1 tablet (0.5 mg total) by mouth daily. Start taking on: June 07, 2024 The timing of this medication is very important.  Indication: Parkinsonian-Like Syndrome   albuterol  108 (90 Base) MCG/ACT inhaler Commonly known as: VENTOLIN  HFA Inhale 2 puffs into the lungs every 6 (six) hours as needed for wheezing or shortness of breath.  Indication: Asthma  clonazePAM  1 MG tablet Commonly known as: KLONOPIN  Take 1 tablet (1 mg total) by mouth daily. What changed:  medication strength how much to take when to take this  Indication: Panic Disorder   clonazePAM  0.5 MG tablet Commonly known as: KLONOPIN  Take 1 tablet (0.5 mg total) by mouth daily. Start taking on: June 07, 2024 What changed: You were already taking a medication with the same name, and this prescription was added. Make sure you understand how and when to take each.  Indication: Panic Disorder   clozapine  50 MG tablet Commonly known as: CLOZARIL  Take 5 tablets (250 mg total) by mouth at bedtime.  Indication: Schizophrenia that does Not Respond to Usual Drug Therapy   Eliquis  2.5 MG Tabs tablet Generic drug: apixaban  Take 1 tablet (2.5 mg total) by mouth 2 (two) times daily.  Indication: Treatment to Prevent Bloot Clot in Lung   fluticasone -salmeterol 250-50 MCG/ACT Aepb Commonly known as: ADVAIR Inhale 1 puff into the lungs 2 (two) times daily.  Indication: Asthma   hydrOXYzine  25 MG tablet Commonly known as: ATARAX  Take 1 tablet (25 mg total) by mouth daily as needed for anxiety.  Indication: Feeling Anxious   lisinopril  2.5 MG tablet Commonly known as: ZESTRIL  Take 1 tablet (2.5 mg total) by mouth daily.  Indication: High Blood Pressure   lithium  carbonate 300 MG ER tablet Commonly known as: LITHOBID  Take 2 tablets (600 mg total) by mouth at bedtime. What changed:  medication strength how much to take  Indication:  Schizoaffective Disorder   metFORMIN  1000 MG tablet Commonly known as: GLUCOPHAGE  Take 1 tablet (1,000 mg total) by mouth 2 (two) times daily with a meal.  Indication: Type 2 Diabetes   nicotine  polacrilex 2 MG gum Commonly known as: NICORETTE  Take 1 each (2 mg total) by mouth as needed for smoking cessation.  Indication: Nicotine  Addiction   risperiDONE  1 MG tablet Commonly known as: RISPERDAL  Take 1 tablet (1 mg total) by mouth at bedtime. What changed: when to take this  Indication: Schizophrenia, schizoaffective disorder   simvastatin  40 MG tablet Commonly known as: ZOCOR  Take 1 tablet (40 mg total) by mouth daily at 6 PM.  Indication: High Cholesterol without a Genetic Cause   traZODone  100 MG tablet Commonly known as: DESYREL  Take 1 tablet (100 mg total) by mouth at bedtime.  Indication: Trouble Sleeping        Follow-up Information     [facility] Follow up.   Why: In person medication maangement appointment is 06/28/23 at 2:20 PM with Jacques Naegeli, PA-C. Contact information: [address],  [place], [place] [address]  [phone]  Fax: [phone]                Follow-up recommendations:  Activity:  as tolerated    Signed: Norie Latendresse, MD 06/06/2024, 10:41 AM          [1]  Social History Tobacco Use  Smoking Status Every Day   Current packs/day: 1.00   Average packs/day: 1 pack/day for 23.0 years (23.0 ttl pk-yrs)   Types: Cigarettes  Smokeless Tobacco Never  Tobacco Comments   will provide material   "

## 2024-06-06 NOTE — Group Note (Signed)
 Recreation Therapy Group Note   Group Topic:Healthy Support Systems  Group Date: 06/06/2024 Start Time: 1010 End Time: 1050 Facilitators: Celestia Jeoffrey BRAVO, LRT, CTRS Location: Craft Room  Group Description: Straw Bridge. In groups or individually, patients were given 10 plastic drinking straws and an equal length of masking tape. Using the materials provided, patients were instructed to build a free-standing bridge-like structure to suspend an everyday item (ex: deck of cards) off the floor or table surface. All materials were required to be used in secondary school teacher. LRT facilitated post-activity discussion reviewing the importance of having strong and healthy support systems in our lives. LRT discussed how the people in our lives serve as the tape and the deck of cards we placed on top of our straw structure are the stressors we face in daily life. LRT and pts discussed what happens in our life when things get too heavy for us , and we don't have strong supports outside of the hospital. Pt shared 2 of their healthy supports in their life aloud in the group.   Goal Area(s) Addressed:  Patient will identify 2 healthy supports in their life. Patient will identify skills to successfully complete activity. Patient will identify correlation of this activity to life post-discharge.  Patient will build on frustration tolerance skills. Patient will increase team building and communication skills.   Affect/Mood: N/A   Participation Level: Did not attend    Clinical Observations/Individualized Feedback: Patient did not attend.  Plan: Continue to engage patient in RT group sessions 2-3x/week.   Jeoffrey BRAVO Celestia, LRT, CTRS 06/06/2024 10:58 AM

## 2024-06-06 NOTE — Progress Notes (Signed)
 Patient pleasant and cooperative on approach. Denies SI,HI and AVH. Verbalized understanding discharge instructions,prescriptions and follow up care.  All belongings returned from [employer]. Patient refused to fill the suicide safety plan. Stated that he is not suicidal anymore.Patient escorted out by staff and transported by group home staff.

## 2024-06-06 NOTE — Progress Notes (Signed)
" °  [facility] Adult Case Management Discharge Plan :  Will you be returning to the same living situation after discharge:  Yes,  pt is returning home At discharge, do you have transportation home?: Yes,  pts group home to provide transportation.  Do you have the ability to pay for your medications: Yes,  MEDICARE / MEDICARE PART A AND B  Release of information consent forms completed and in the chart;  Patient's signature needed at discharge.  Patient to Follow up at:  Follow-up Information     [facility] Follow up.   Why: In person medication maangement appointment is 06/28/23 at 2:20 PM with Jacques Naegeli, PA-C. Contact information: [address],  [place], [place] [address]  Phone:[phone]  Fax: [phone]                Next level of care provider has access to [facility] Link:no  Safety Planning and Suicide Prevention discussed: Yes,  SPE completed with the patient's guardian.     Has patient been referred to the Quitline?: Patient refused referral for treatment  Patient has been referred for addiction treatment: No known substance use disorder.  Sherryle JINNY Margo, LCSW 06/06/2024, 9:50 AM "

## 2024-07-20 ENCOUNTER — Encounter: Payer: Self-pay | Admitting: Podiatry

## 2024-07-20 ENCOUNTER — Ambulatory Visit: Admitting: Podiatry

## 2024-07-20 DIAGNOSIS — L853 Xerosis cutis: Secondary | ICD-10-CM

## 2024-07-20 MED ORDER — AMMONIUM LACTATE 12 % EX LOTN: TOPICAL_LOTION | CUTANEOUS | 5 refills | Status: AC

## 2024-07-20 NOTE — Patient Instructions (Signed)
" °  For dry skin:  Apply ammonium lactate  lotion 12% to both feet twice daily avoiding application between toes. "

## 2024-10-19 ENCOUNTER — Ambulatory Visit: Admitting: Podiatry
